# Patient Record
Sex: Male | Born: 1945 | Race: White | Hispanic: No | Marital: Married | Smoking: Former smoker
Health system: Southern US, Community
[De-identification: ages and names within clinical notes are randomized; demographics above are authoritative.]

## PROBLEM LIST (undated history)

## (undated) ENCOUNTER — Emergency Department (HOSPITAL_COMMUNITY): Payer: Medicare Other

## (undated) DIAGNOSIS — I509 Heart failure, unspecified: Secondary | ICD-10-CM

## (undated) DIAGNOSIS — J189 Pneumonia, unspecified organism: Secondary | ICD-10-CM

## (undated) DIAGNOSIS — I951 Orthostatic hypotension: Secondary | ICD-10-CM

## (undated) DIAGNOSIS — I10 Essential (primary) hypertension: Secondary | ICD-10-CM

## (undated) DIAGNOSIS — J449 Chronic obstructive pulmonary disease, unspecified: Secondary | ICD-10-CM

## (undated) DIAGNOSIS — D689 Coagulation defect, unspecified: Secondary | ICD-10-CM

## (undated) DIAGNOSIS — R42 Dizziness and giddiness: Secondary | ICD-10-CM

## (undated) DIAGNOSIS — M549 Dorsalgia, unspecified: Secondary | ICD-10-CM

## (undated) DIAGNOSIS — R0902 Hypoxemia: Secondary | ICD-10-CM

## (undated) DIAGNOSIS — K219 Gastro-esophageal reflux disease without esophagitis: Secondary | ICD-10-CM

## (undated) DIAGNOSIS — I739 Peripheral vascular disease, unspecified: Secondary | ICD-10-CM

## (undated) DIAGNOSIS — B351 Tinea unguium: Secondary | ICD-10-CM

## (undated) DIAGNOSIS — M810 Age-related osteoporosis without current pathological fracture: Secondary | ICD-10-CM

## (undated) DIAGNOSIS — J310 Chronic rhinitis: Secondary | ICD-10-CM

## (undated) DIAGNOSIS — T7840XA Allergy, unspecified, initial encounter: Secondary | ICD-10-CM

## (undated) DIAGNOSIS — G8929 Other chronic pain: Secondary | ICD-10-CM

## (undated) DIAGNOSIS — R0600 Dyspnea, unspecified: Secondary | ICD-10-CM

## (undated) DIAGNOSIS — I2699 Other pulmonary embolism without acute cor pulmonale: Secondary | ICD-10-CM

## (undated) DIAGNOSIS — C4490 Unspecified malignant neoplasm of skin, unspecified: Secondary | ICD-10-CM

## (undated) DIAGNOSIS — J439 Emphysema, unspecified: Secondary | ICD-10-CM

## (undated) DIAGNOSIS — R9389 Abnormal findings on diagnostic imaging of other specified body structures: Secondary | ICD-10-CM

## (undated) DIAGNOSIS — Z5189 Encounter for other specified aftercare: Secondary | ICD-10-CM

## (undated) DIAGNOSIS — M4850XA Collapsed vertebra, not elsewhere classified, site unspecified, initial encounter for fracture: Secondary | ICD-10-CM

## (undated) DIAGNOSIS — Z9981 Dependence on supplemental oxygen: Secondary | ICD-10-CM

## (undated) HISTORY — DX: Hypoxemia: R09.02

## (undated) HISTORY — DX: Gastro-esophageal reflux disease without esophagitis: K21.9

## (undated) HISTORY — DX: Abnormal findings on diagnostic imaging of other specified body structures: R93.89

## (undated) HISTORY — DX: Age-related osteoporosis without current pathological fracture: M81.0

## (undated) HISTORY — DX: Allergy, unspecified, initial encounter: T78.40XA

## (undated) HISTORY — PX: FRACTURE SURGERY: SHX138

## (undated) HISTORY — PX: EYE SURGERY: SHX253

## (undated) HISTORY — PX: CHOLECYSTECTOMY: SHX55

## (undated) HISTORY — DX: Encounter for other specified aftercare: Z51.89

## (undated) HISTORY — PX: CATARACT EXTRACTION, BILATERAL: SHX1313

## (undated) HISTORY — DX: Essential (primary) hypertension: I10

## (undated) HISTORY — DX: Tinea unguium: B35.1

## (undated) HISTORY — DX: Coagulation defect, unspecified: D68.9

## (undated) HISTORY — DX: Heart failure, unspecified: I50.9

## (undated) HISTORY — DX: Chronic obstructive pulmonary disease, unspecified: J44.9

## (undated) HISTORY — DX: Chronic rhinitis: J31.0

## (undated) HISTORY — PX: APPENDECTOMY: SHX54

## (undated) HISTORY — PX: TONSILLECTOMY: SUR1361

## (undated) HISTORY — PX: SKIN CANCER EXCISION: SHX779

---

## 1997-11-09 ENCOUNTER — Other Ambulatory Visit: Admission: RE | Admit: 1997-11-09 | Discharge: 1997-11-09 | Payer: Self-pay | Admitting: Dermatology

## 2002-09-15 ENCOUNTER — Encounter: Payer: Self-pay | Admitting: Internal Medicine

## 2002-09-30 ENCOUNTER — Encounter: Payer: Self-pay | Admitting: Internal Medicine

## 2002-10-11 ENCOUNTER — Encounter: Payer: Self-pay | Admitting: Internal Medicine

## 2005-12-27 ENCOUNTER — Ambulatory Visit: Payer: Self-pay | Admitting: Internal Medicine

## 2005-12-31 ENCOUNTER — Encounter: Payer: Self-pay | Admitting: Internal Medicine

## 2005-12-31 ENCOUNTER — Encounter: Admission: RE | Admit: 2005-12-31 | Discharge: 2005-12-31 | Payer: Self-pay | Admitting: Internal Medicine

## 2005-12-31 IMAGING — US US SCROTUM
1 series · 14 of 25 positions shown · non-contrast
Comparison: none

CLINICAL DATA: Right scrotal mass for 1 month. 
 SCROTAL ULTRASOUND:
TECHNIQUE: Complete ultrasound examination of the testicles, epididymis, and other scrotal structures was performed.

[Series 1: us scrotum · 0.08mm/px · 58 acquisitions, 14 frames shown]
[im 1/58]
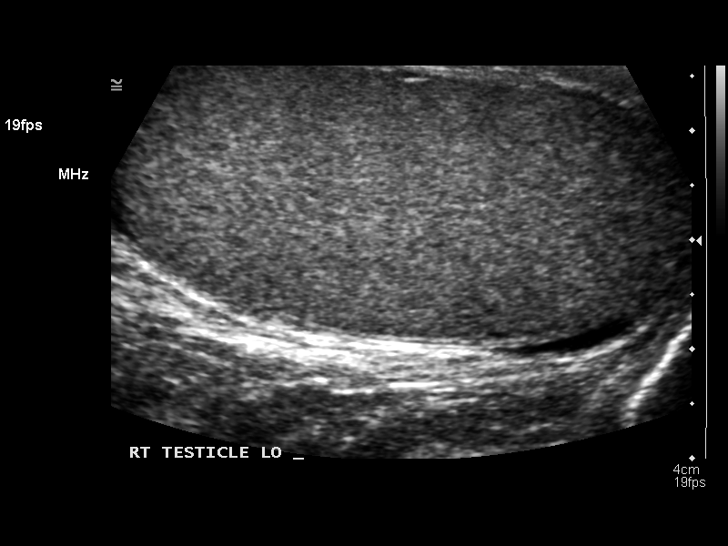
[im 5/58]
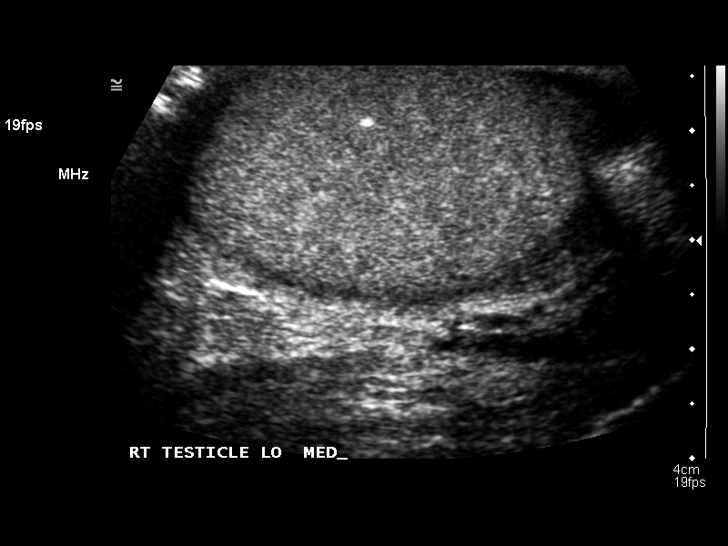
[im 10/58]
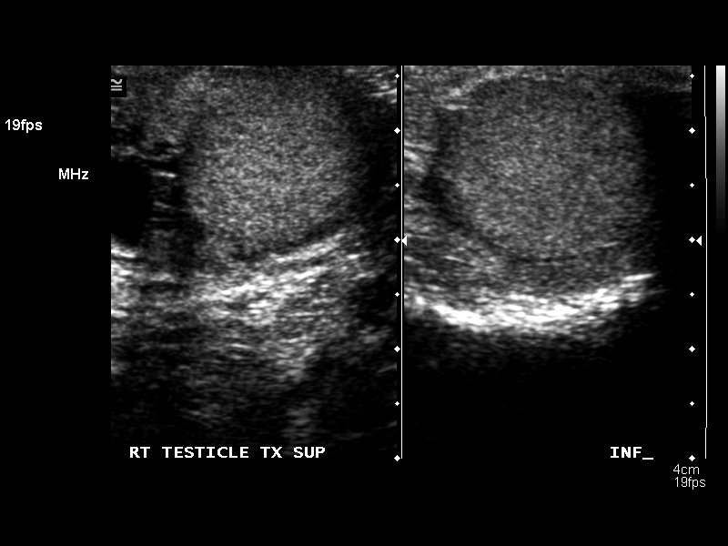
[im 15/58]
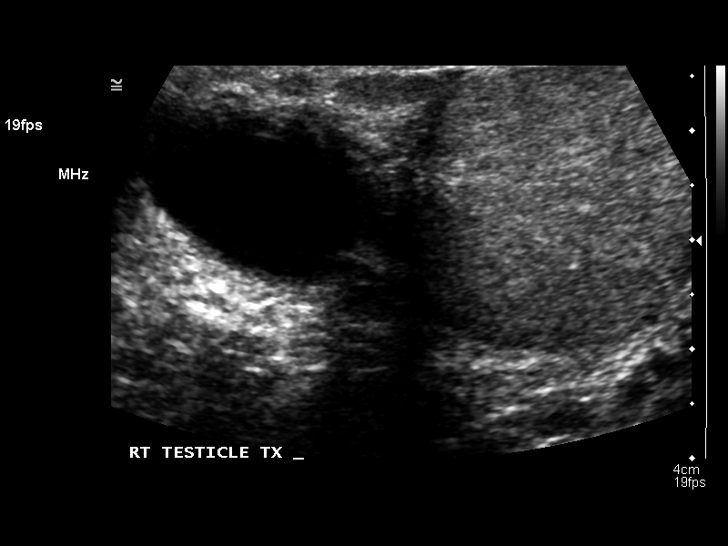
[im 20/58]
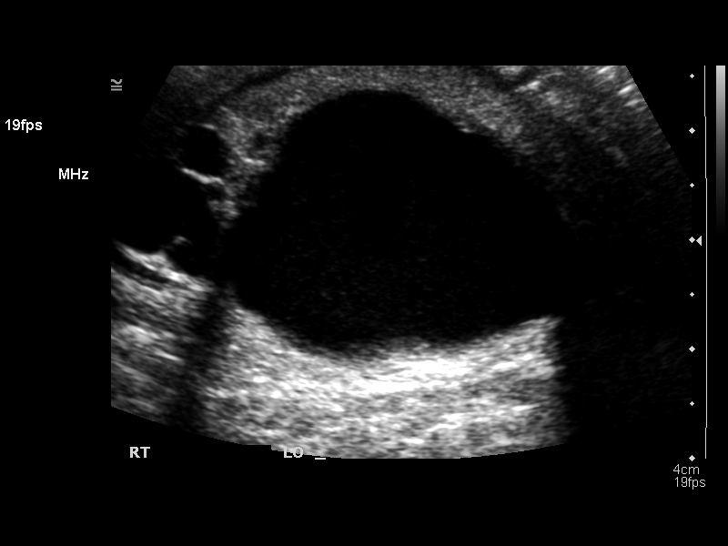
[im 22/58]
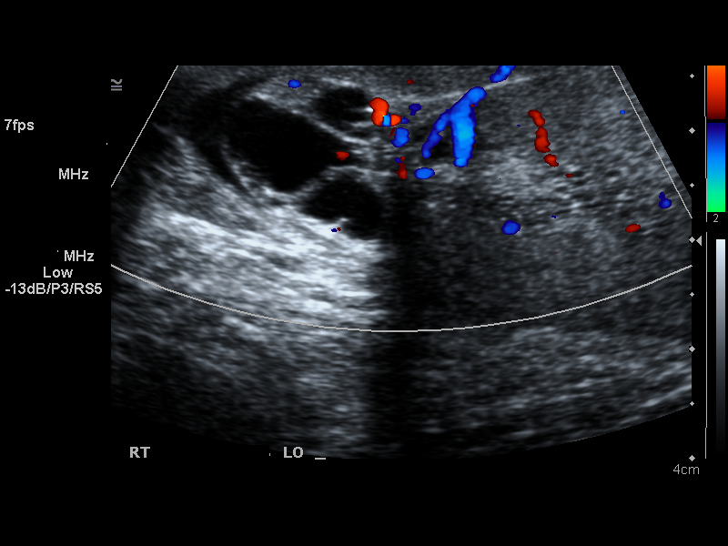
[im 27/58]
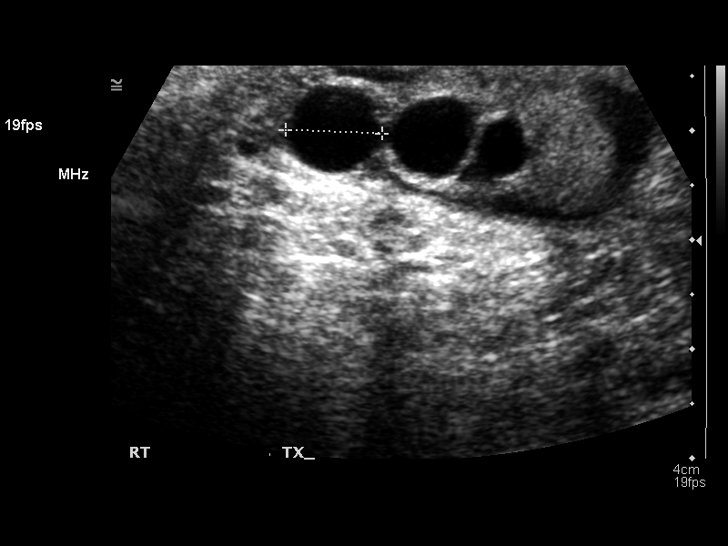
[im 31/58]
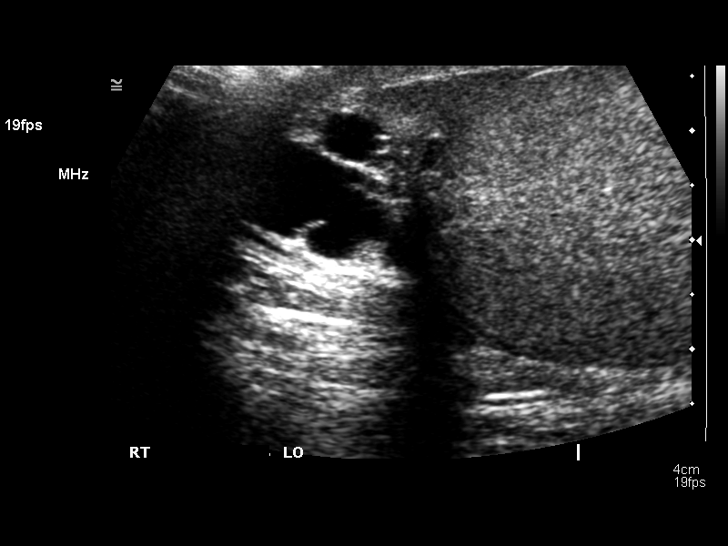
[im 36/58]
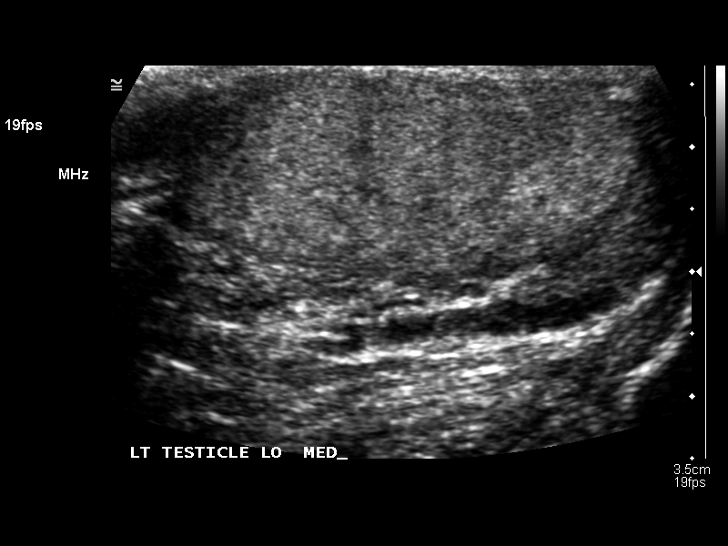
[im 39/58]
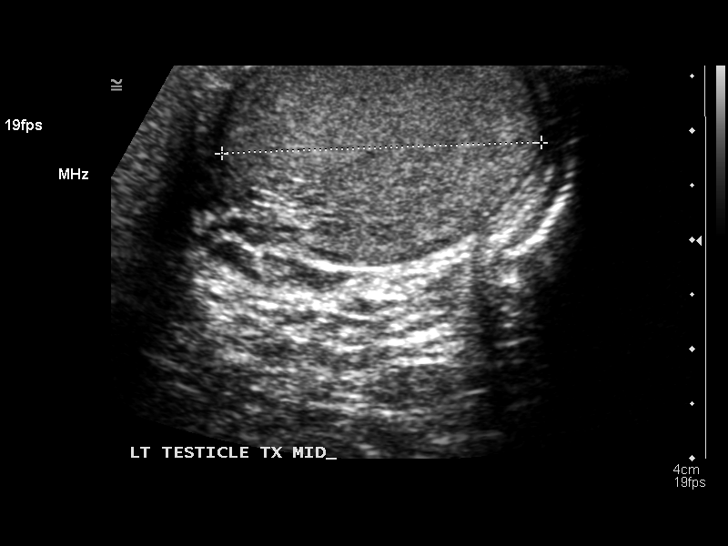
[im 43/58]
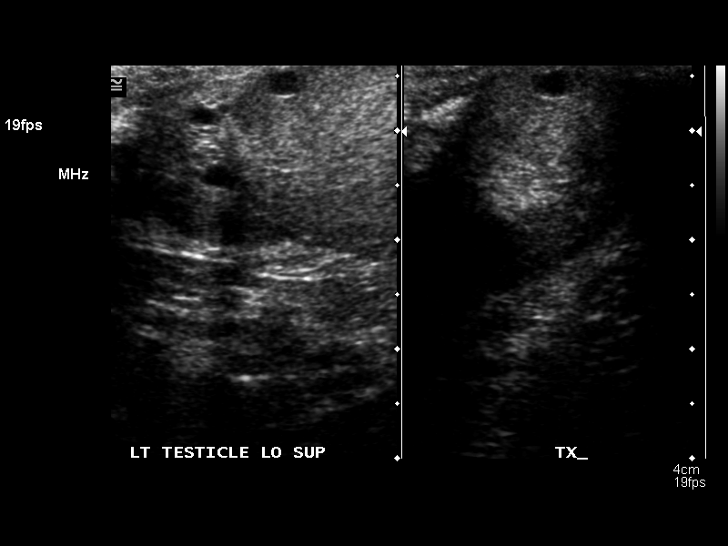
[im 48/58]
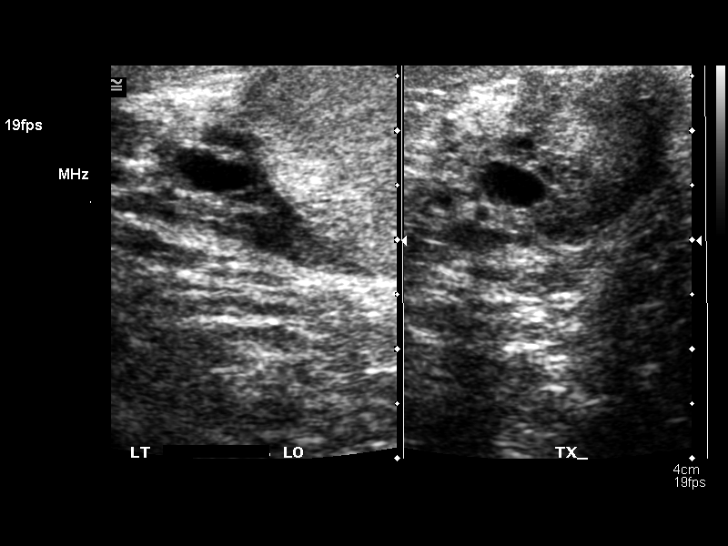
[im 53/58]
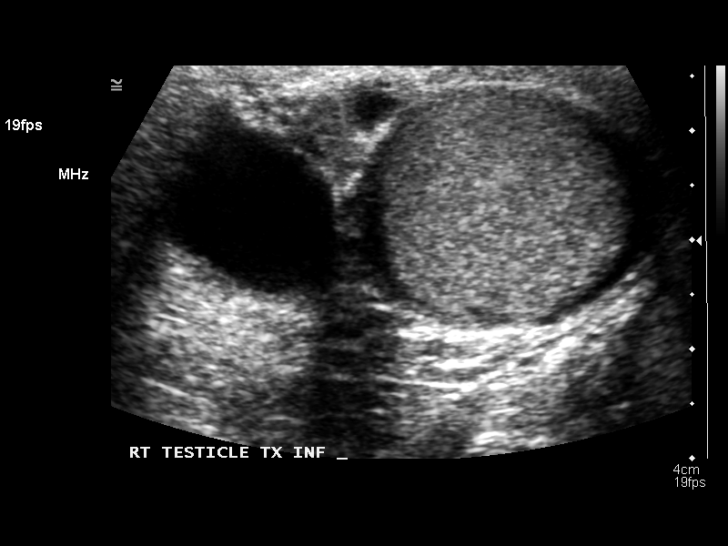
[im 58/58]
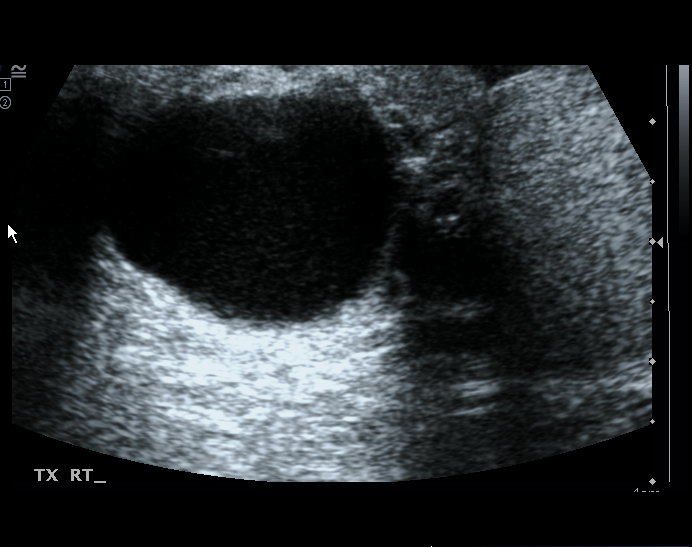

[14 of 25 positions shown; findings below may reference images not displayed]

FINDINGS: The testes are fairly symmetric in size, measuring 5.3 x 2.6 x 3.0 cm on the right and 4.9 x 2.1 x 2.9 cm on the left.  No suspicious testicular findings are present.  Superiorly in the left testis is a probable tunica albuginea cyst measuring 4 mm in greatest dimension.
 Corresponding with the patient?s palpable concern in the right scrotum is an extratesticular cyst measuring 3.9 x 2.0 x 2.9 cm.  This somewhat deforms the testis and is probably arising from the right epididymis.  There are several additional smaller epididymal cysts bilaterally.  No hydrocele or varicocele is seen.
IMPRESSION: 1.  There are multiple extratesticular cysts bilaterally, the largest on the right arising from the epididymis and measuring up to 3.9 cm in diameter.  
 2.  There are no suspicious testicular findings.  A probable incidental tunica cyst is demonstrated on the left.

## 2007-02-25 ENCOUNTER — Telehealth (INDEPENDENT_AMBULATORY_CARE_PROVIDER_SITE_OTHER): Payer: Self-pay | Admitting: *Deleted

## 2007-04-24 ENCOUNTER — Ambulatory Visit: Payer: Self-pay | Admitting: Internal Medicine

## 2007-04-28 ENCOUNTER — Telehealth: Payer: Self-pay | Admitting: Internal Medicine

## 2008-08-18 ENCOUNTER — Telehealth: Payer: Self-pay | Admitting: Internal Medicine

## 2008-08-23 ENCOUNTER — Ambulatory Visit: Payer: Self-pay | Admitting: Internal Medicine

## 2008-08-23 DIAGNOSIS — J439 Emphysema, unspecified: Secondary | ICD-10-CM

## 2008-08-23 DIAGNOSIS — Z86010 Personal history of colonic polyps: Secondary | ICD-10-CM | POA: Insufficient documentation

## 2008-08-23 DIAGNOSIS — Z87891 Personal history of nicotine dependence: Secondary | ICD-10-CM | POA: Insufficient documentation

## 2008-10-31 ENCOUNTER — Ambulatory Visit: Payer: Self-pay | Admitting: Internal Medicine

## 2008-10-31 DIAGNOSIS — R930 Abnormal findings on diagnostic imaging of skull and head, not elsewhere classified: Secondary | ICD-10-CM | POA: Insufficient documentation

## 2008-10-31 LAB — CONVERTED CEMR LAB
BUN: 9 mg/dL (ref 6–23)
Basophils Absolute: 0 10*3/uL (ref 0.0–0.1)
Cholesterol: 192 mg/dL (ref 0–200)
Creatinine, Ser: 1 mg/dL (ref 0.4–1.5)
Eosinophils Relative: 1.7 % (ref 0.0–5.0)
GFR calc non Af Amer: 80.71 mL/min (ref >60)
LDL Cholesterol: 118 mg/dL — ABNORMAL HIGH (ref 0–99)
MCV: 98.2 fL (ref 78.0–100.0)
Monocytes Absolute: 0.5 10*3/uL (ref 0.1–1.0)
Monocytes Relative: 5.5 % (ref 3.0–12.0)
Neutrophils Relative %: 77.8 % — ABNORMAL HIGH (ref 43.0–77.0)
Platelets: 299 10*3/uL (ref 150.0–400.0)
TSH: 1.33 microintl units/mL (ref 0.35–5.50)
Triglycerides: 63 mg/dL (ref 0.0–149.0)
VLDL: 12.6 mg/dL (ref 0.0–40.0)
WBC: 8.5 10*3/uL (ref 4.5–10.5)

## 2008-10-31 IMAGING — CR DG CHEST 2V
5 series · 5 of 5 positions shown · non-contrast
Comparison: [DATE]

CLINICAL DATA: Cough and worsening shortness of breath.  Smoker.

CHEST - 2 VIEW

[view not recorded (1 of 5)]
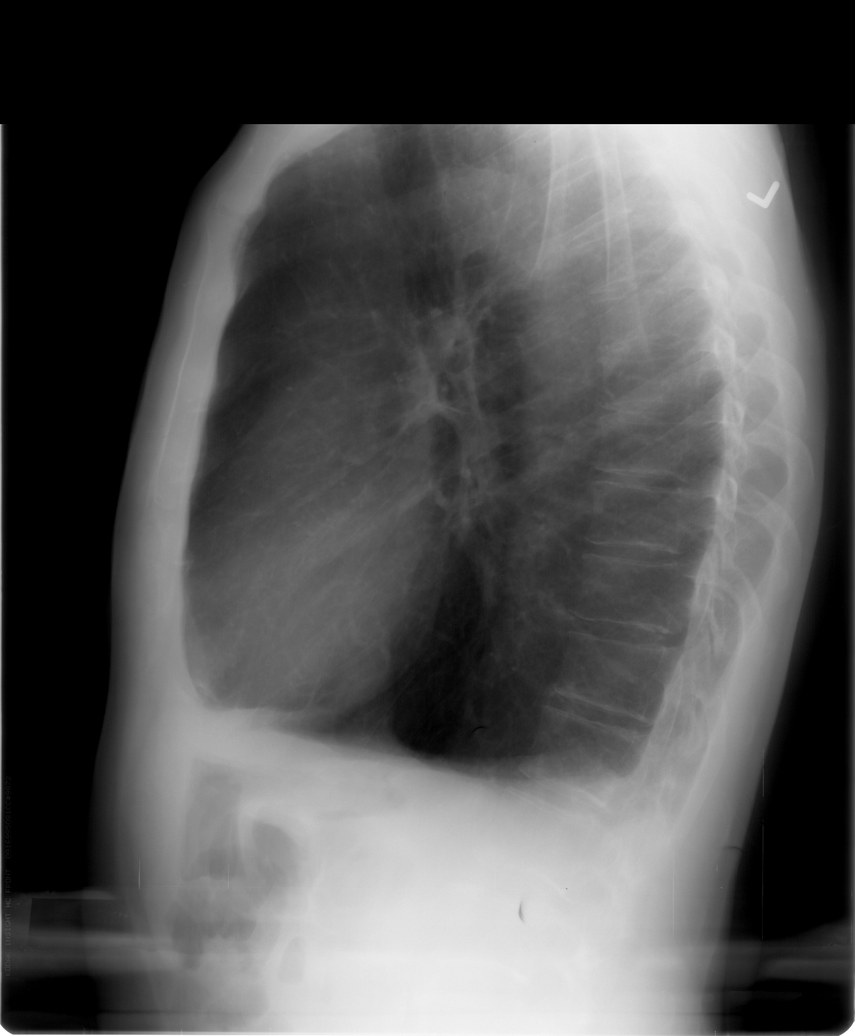

[view not recorded (2 of 5)]
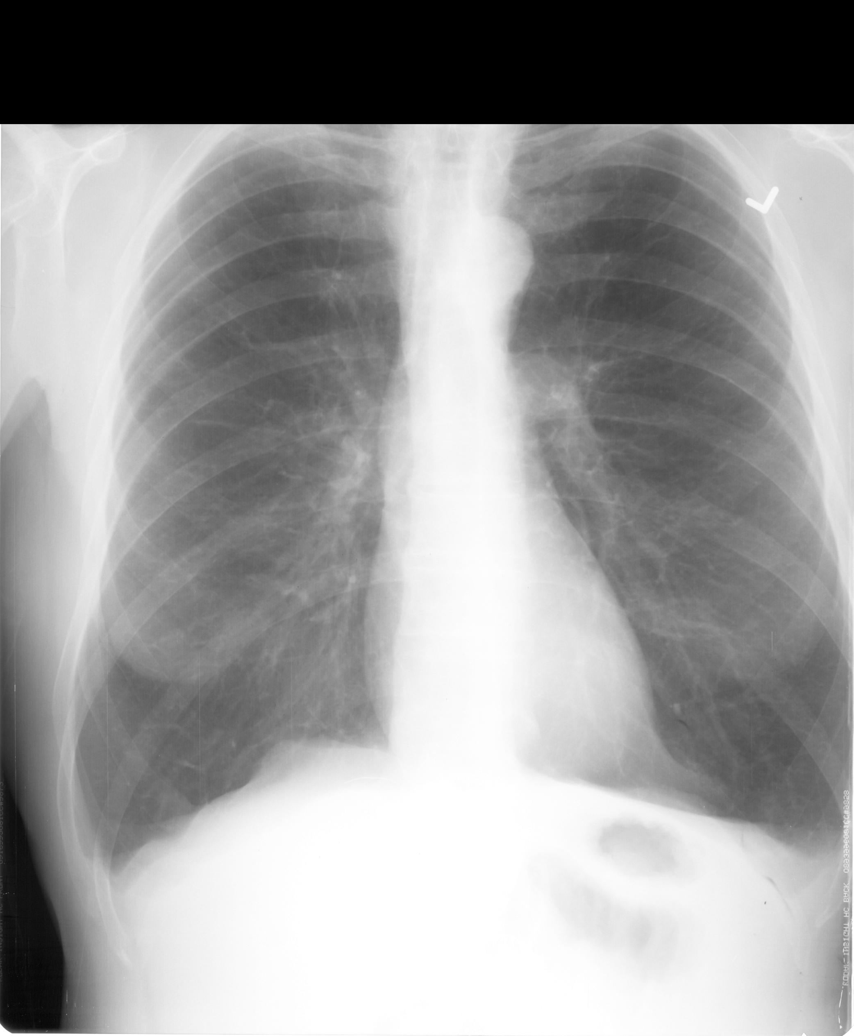

[view not recorded (3 of 5)]
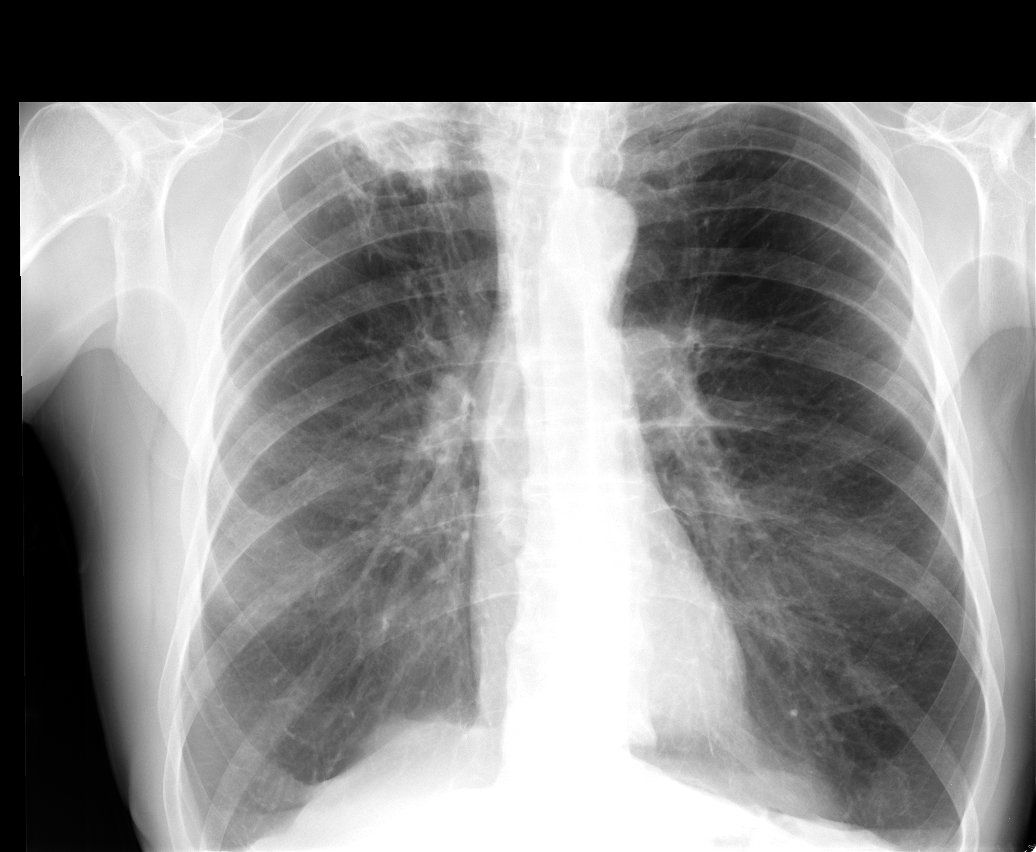

[view not recorded (4 of 5)]
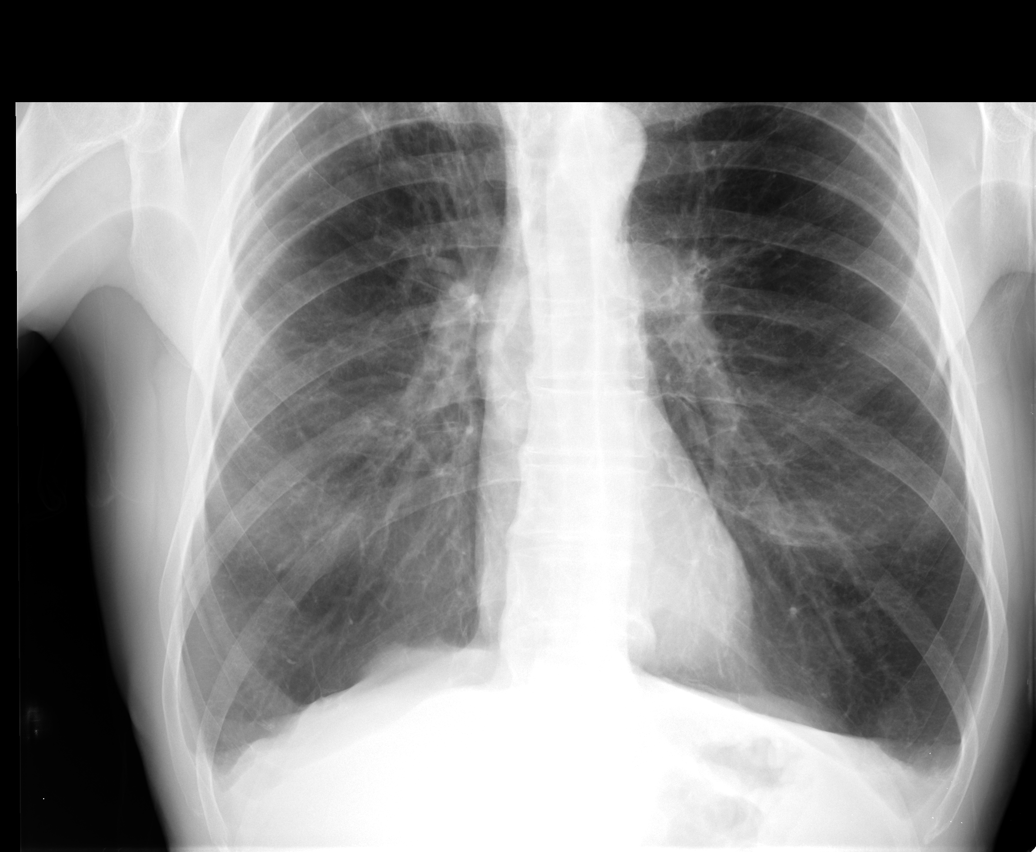

[view not recorded (5 of 5)]
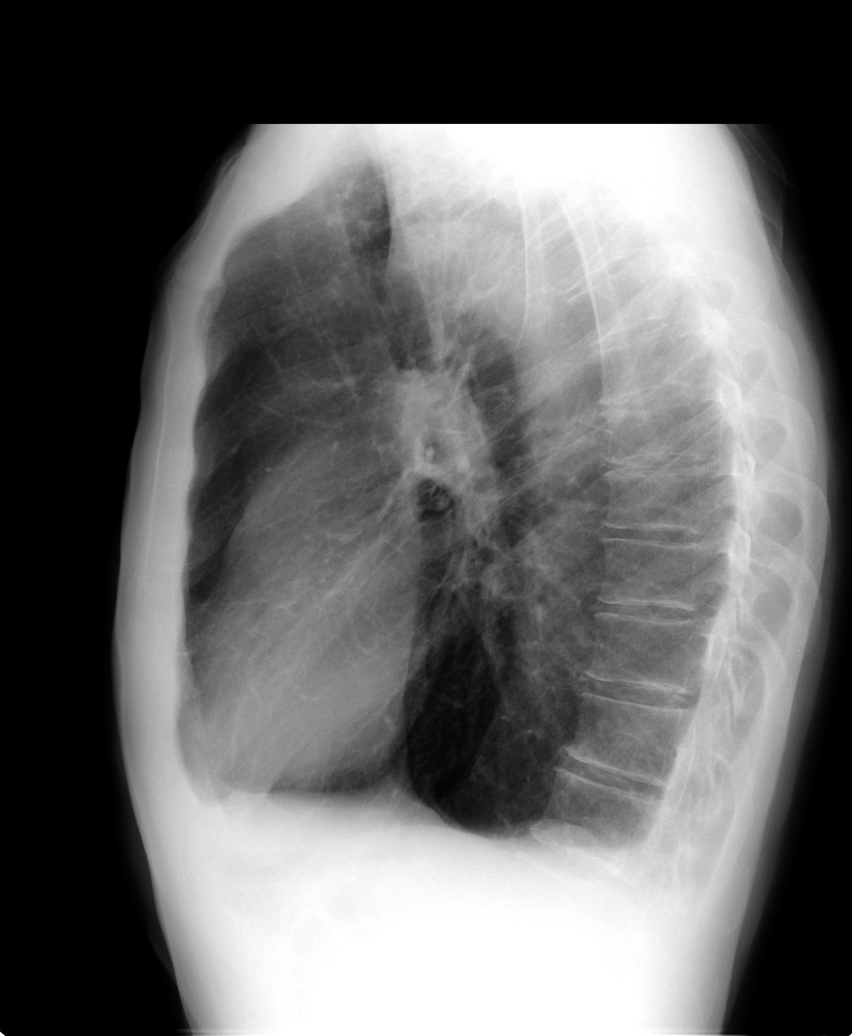

[5 of 5 positions shown; findings below may reference images not displayed]

FINDINGS: Trachea is midline.  Heart size normal.  Airspace
consolidation is seen in the right lung apex.  There may be
associated cavitation.  Slight right hilar retraction is also
noted.  Lungs appear emphysematous.  Added density over the lower
right mid lung zone is likely due to overlying soft tissue.  No
pleural fluid.
IMPRESSION: Right apical mass-like air space consolidation may be due to
infection or malignancy.  Tuberculosis is not excluded.  This was
discussed with HIRPA, in Dr. [REDACTED] at [DATE] on
[DATE].

## 2008-11-02 ENCOUNTER — Ambulatory Visit: Payer: Self-pay | Admitting: Cardiology

## 2008-11-02 ENCOUNTER — Ambulatory Visit: Payer: Self-pay | Admitting: Internal Medicine

## 2008-11-02 IMAGING — CT CT CHEST W/ CM
2 of 4 series · 15 of 36 positions shown, 18 images · IV contrast (agent unspecified)
Comparison: No prior chest CT. [HOSPITAL] chest x-ray exam
dated [DATE].

CLINICAL DATA: Right apical mass seen on recent chest x-ray.
Shortness of breath with exertion.  Cough.  Weight loss.  Smoking
history.

CT CHEST WITH CONTRAST
TECHNIQUE: Multidetector CT imaging of the chest was performed
following the standard protocol during bolus administration of
intravenous contrast.
Contrast: 80 ml [Y5]

[Series 2: chest routine with · axial · 0.77mm/px · z∈[-383,-53]mm · 12 of 78 slices shown, 15 images]
[im 6/78  mediastinal]
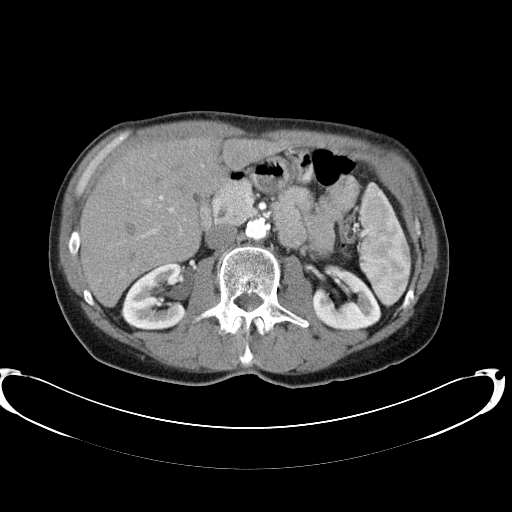
[im 6/78  lung]
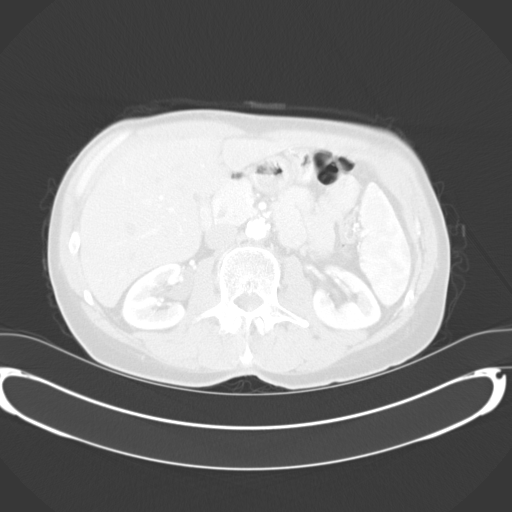
[im 12/78  lung]
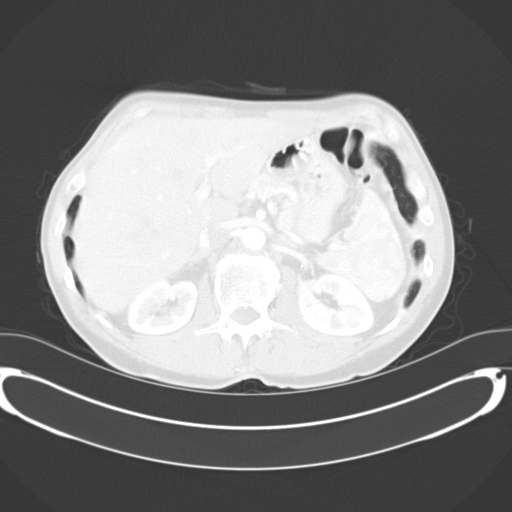
[im 18/78  lung]
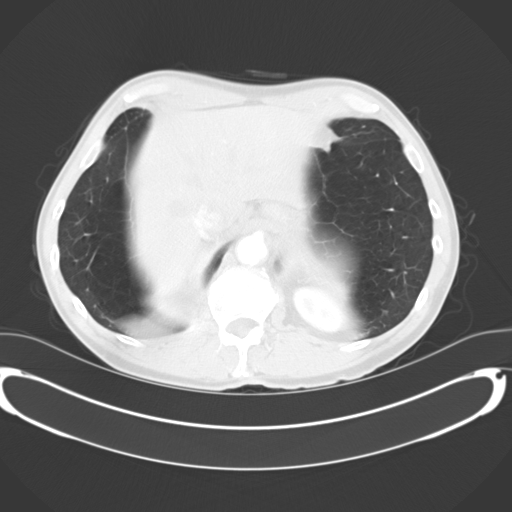
[im 24/78  lung]
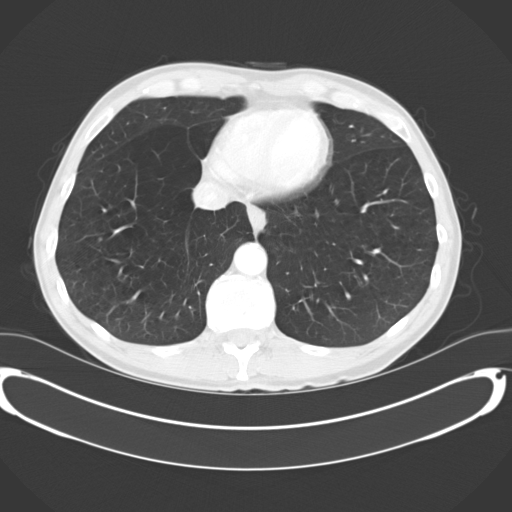
[im 30/78  mediastinal]
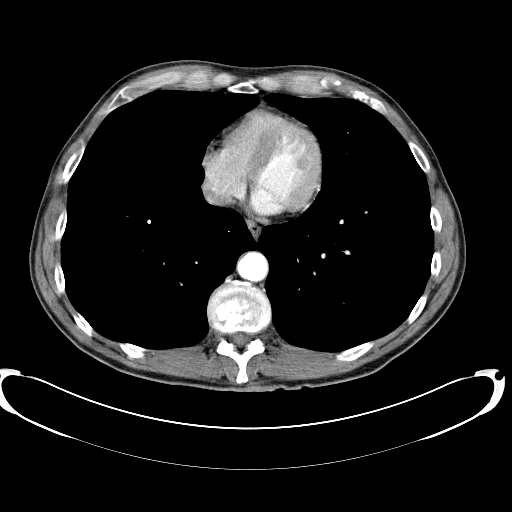
[im 30/78  lung]
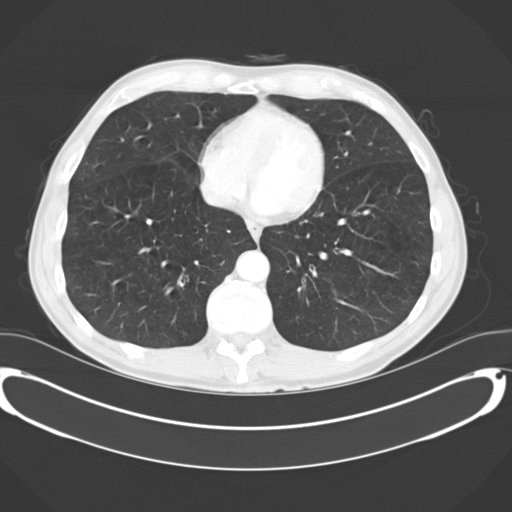
[im 36/78  lung]
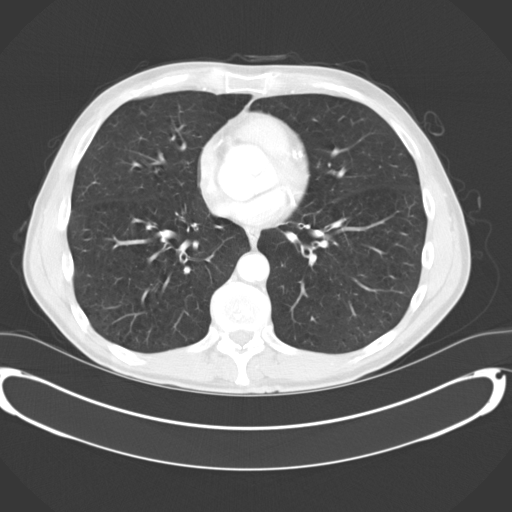
[im 42/78  lung]
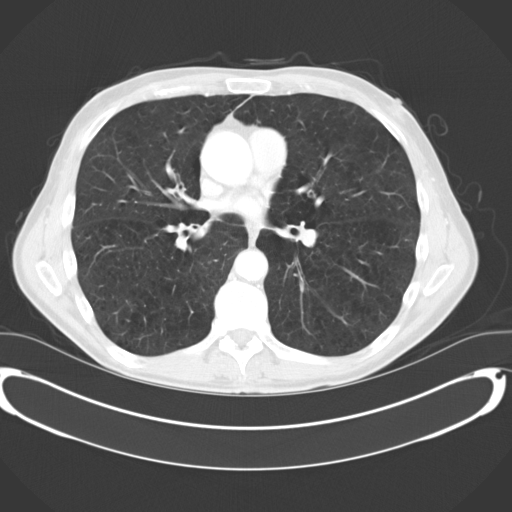
[im 48/78  lung]
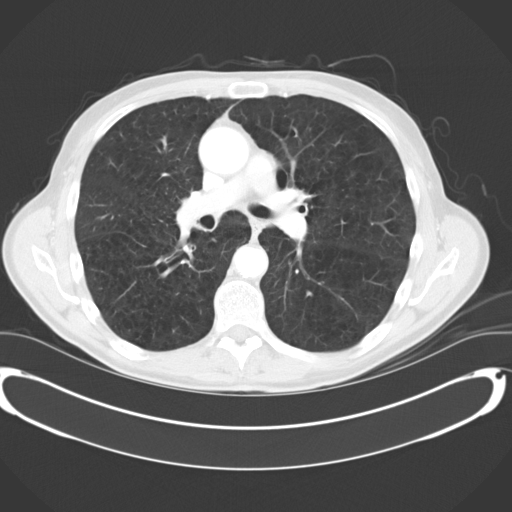
[im 54/78  mediastinal]
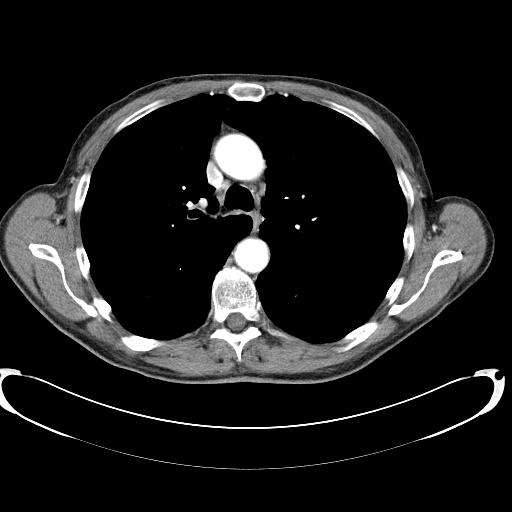
[im 54/78  lung]
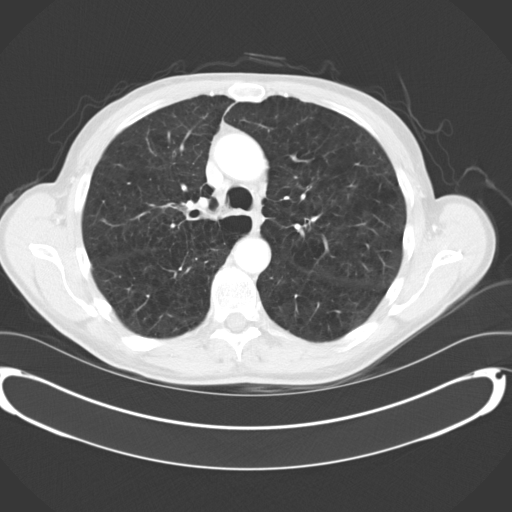
[im 60/78  lung]
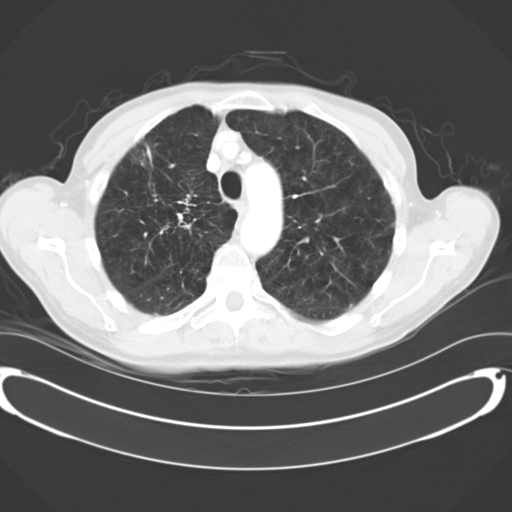
[im 66/78  lung]
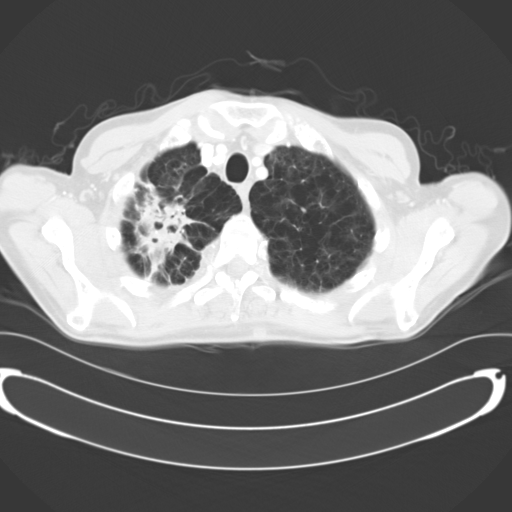
[im 72/78  lung]
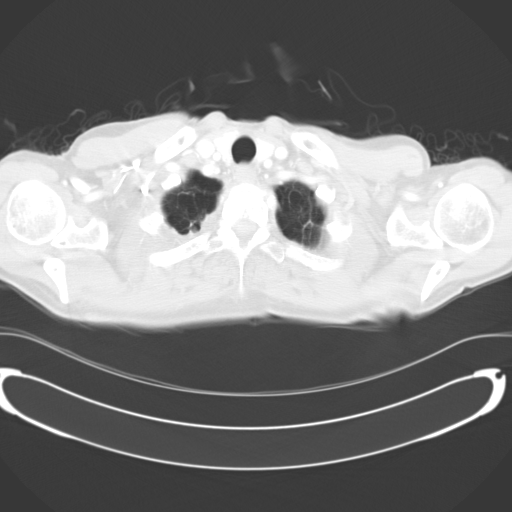

[Series 602: <mpr range> · coronal · 0.77mm/px · 3 of 123 slices shown]
[im 25/123  lung]
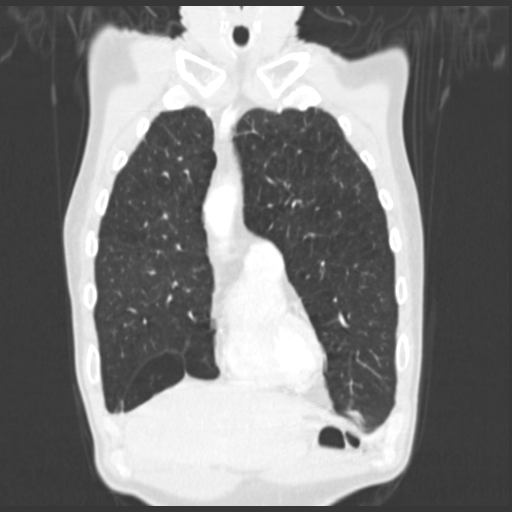
[im 49/123  lung]
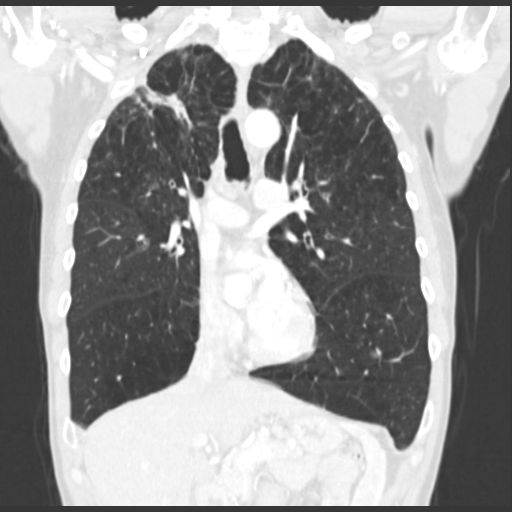
[im 74/123  lung]
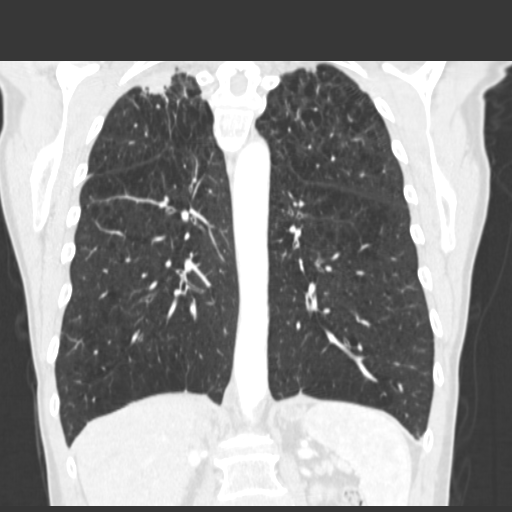

[15 of 36 positions shown; findings below may reference images not displayed]

FINDINGS: There is no axillary lymphadenopathy.  Scattered small
lymph nodes are seen in the mediastinum, but there is no
mediastinal or hilar lymphadenopathy.  Heart size is normal.
Ascending aorta measures up to 3.8 cm in diameter, borderline
aneurysmal.  Coronary artery calcification is evident.  There is no
pericardial or pleural effusion.

Advanced emphysema is seen diffusely in both lungs.  The
abnormality seen on the x-ray corresponds to a large area of
parenchymal consolidation with some air bronchograms and cystic
change.  This measures about 5.7 x 6.5 cm.  Slight anterior
displacement major fissure suggests that there is some volume loss
associated with this process as well.

Otherwise the lungs show no evidence for parenchymal nodule or
mass.  No other areas of airspace consolidation are evident.

Bone windows revealed no worrisome lytic or sclerotic osseous
lesion.  Imaging which includes the upper abdomen shows no evidence
for an adnexal mass.
IMPRESSION: Ill-defined 6 cm opacity in the right apex superimposed on a
background of advanced emphysema.  There is a degree of volume loss
associated with this process.  Imaging considerations include post
infectious/postinflammatory scarring, infection, and neoplasm.

No evidence for right hilar or mediastinal lymphadenopathy.

## 2008-11-03 ENCOUNTER — Ambulatory Visit: Payer: Self-pay | Admitting: Internal Medicine

## 2008-11-07 ENCOUNTER — Telehealth (INDEPENDENT_AMBULATORY_CARE_PROVIDER_SITE_OTHER): Payer: Self-pay | Admitting: *Deleted

## 2008-11-08 ENCOUNTER — Ambulatory Visit: Payer: Self-pay

## 2008-11-08 ENCOUNTER — Encounter: Payer: Self-pay | Admitting: Internal Medicine

## 2008-11-15 ENCOUNTER — Ambulatory Visit: Payer: Self-pay | Admitting: Pulmonary Disease

## 2008-12-12 ENCOUNTER — Telehealth: Payer: Self-pay | Admitting: Internal Medicine

## 2008-12-13 ENCOUNTER — Ambulatory Visit: Payer: Self-pay | Admitting: Internal Medicine

## 2008-12-13 IMAGING — CR DG CHEST 2V
3 series · 3 of 3 positions shown · non-contrast
Comparison: CT of [DATE] and plain film of [DATE]

CLINICAL DATA: Cough and fever for 4 days.  Posterior left chest
pain.  Smoker.  Emphysema.

CHEST - 2 VIEW

[view not recorded (1 of 3)]
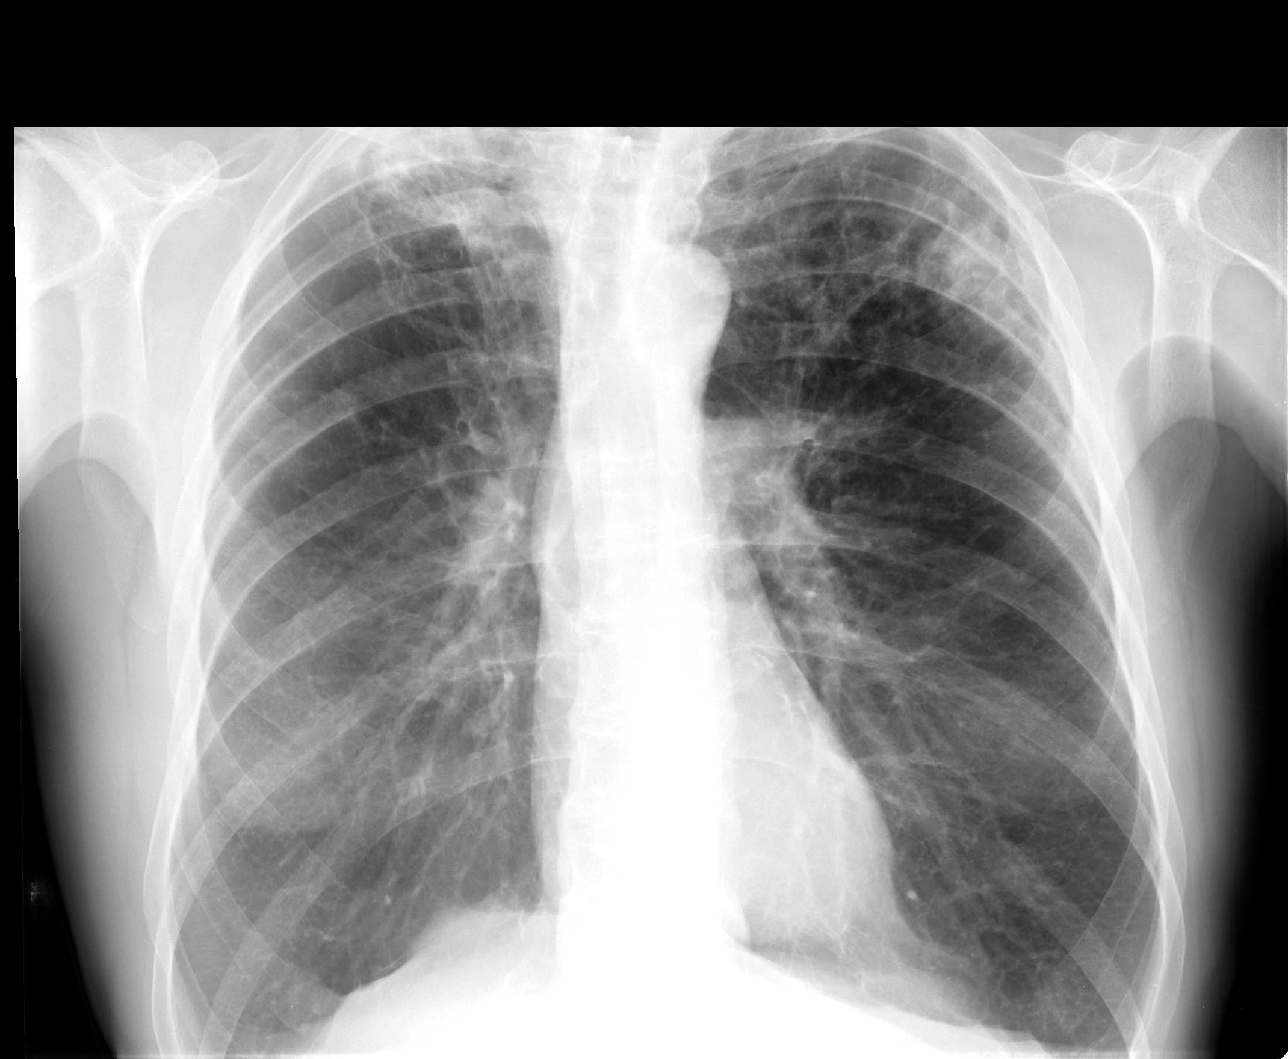

[view not recorded (2 of 3)]
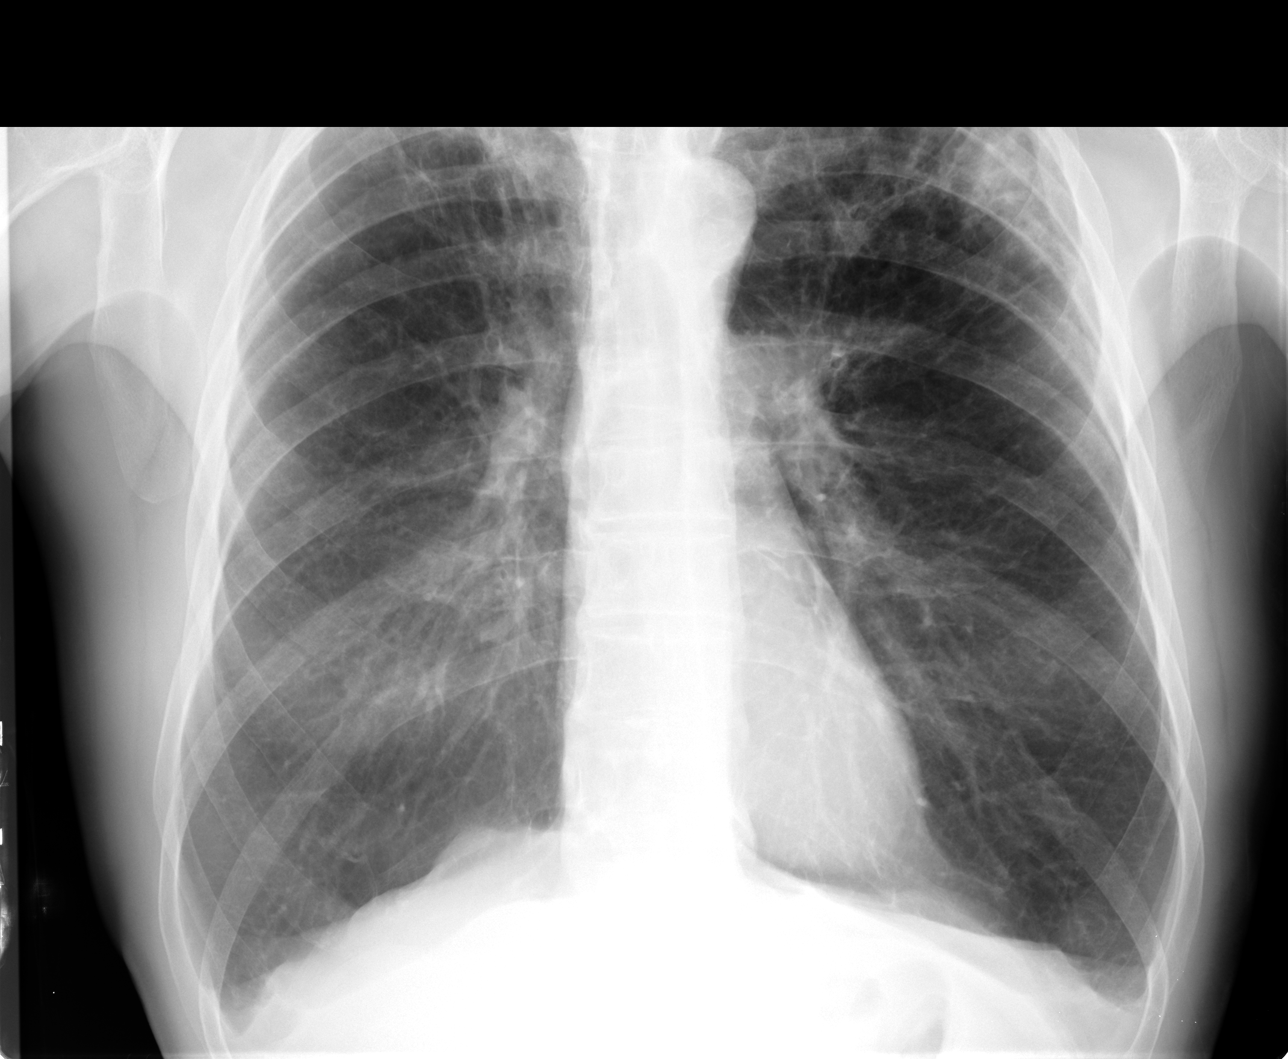

[view not recorded (3 of 3)]
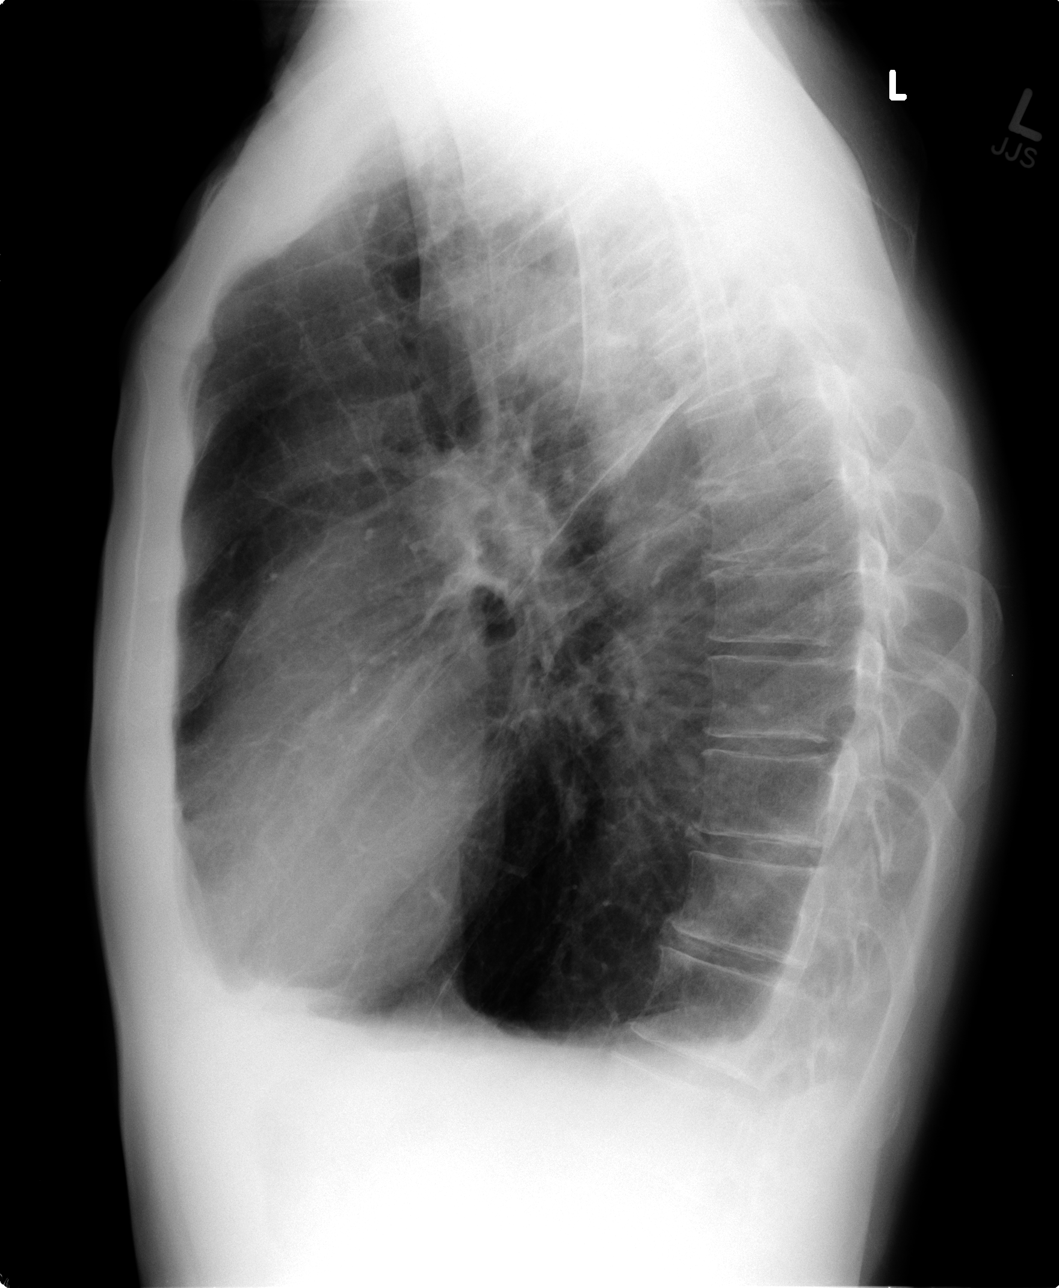

[3 of 3 positions shown; findings below may reference images not displayed]

FINDINGS: Underlying COPD. Midline trachea.Normal heart size and
mediastinal contours. Trace bilateral pleural effusions versus
pleural thickening . Similar right apical opacity with probable
underlying bronchiectasis.  Development of patchy left apical and
upper lobe interstitial opacity.
IMPRESSION: 1.  Development of left upper lobe opacity.  Given the appearance
of the right apex, this is suspicious for atypical infection,
including tuberculosis.  Recommend radiographic follow-up until
clearing.
2.  Similar right apical opacity as detailed on CT of [DATE].
[DATE].  Underlying COPD.
4.  Trace bilateral pleural effusions versus pleural thickening.

## 2008-12-16 ENCOUNTER — Telehealth (INDEPENDENT_AMBULATORY_CARE_PROVIDER_SITE_OTHER): Payer: Self-pay | Admitting: *Deleted

## 2008-12-16 ENCOUNTER — Ambulatory Visit: Payer: Self-pay | Admitting: Pulmonary Disease

## 2008-12-21 ENCOUNTER — Telehealth: Payer: Self-pay | Admitting: Pulmonary Disease

## 2008-12-22 ENCOUNTER — Ambulatory Visit: Admission: RE | Admit: 2008-12-22 | Discharge: 2008-12-22 | Payer: Self-pay | Admitting: Emergency Medicine

## 2008-12-22 ENCOUNTER — Encounter: Payer: Self-pay | Admitting: Emergency Medicine

## 2008-12-22 ENCOUNTER — Encounter: Payer: Self-pay | Admitting: Pulmonary Disease

## 2008-12-22 ENCOUNTER — Ambulatory Visit: Payer: Self-pay | Admitting: Emergency Medicine

## 2008-12-22 IMAGING — CR DG CHEST 1V PORT
2 series · 2 of 2 positions shown · non-contrast
Comparison: [DATE]

CLINICAL DATA: Post bronchoscopy with biopsy.

PORTABLE CHEST - 1 VIEW

[view not recorded (1 of 2)]
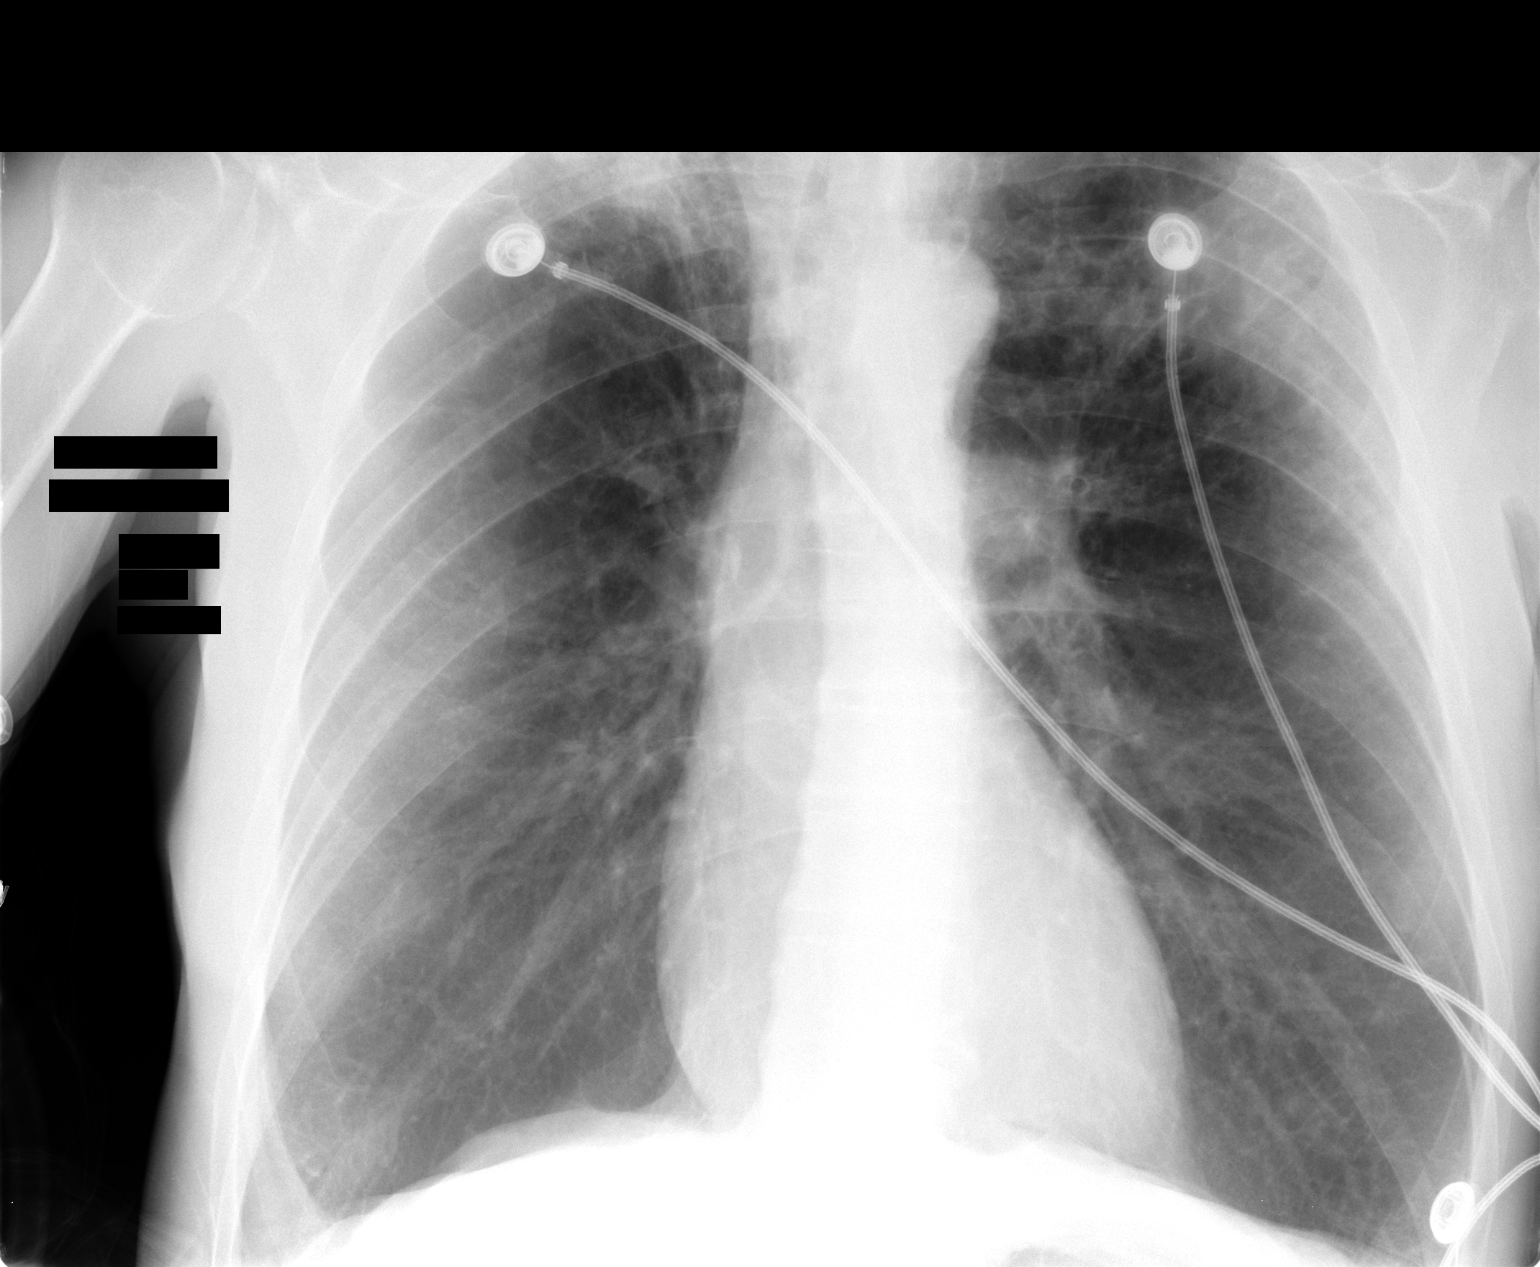

[view not recorded (2 of 2)]
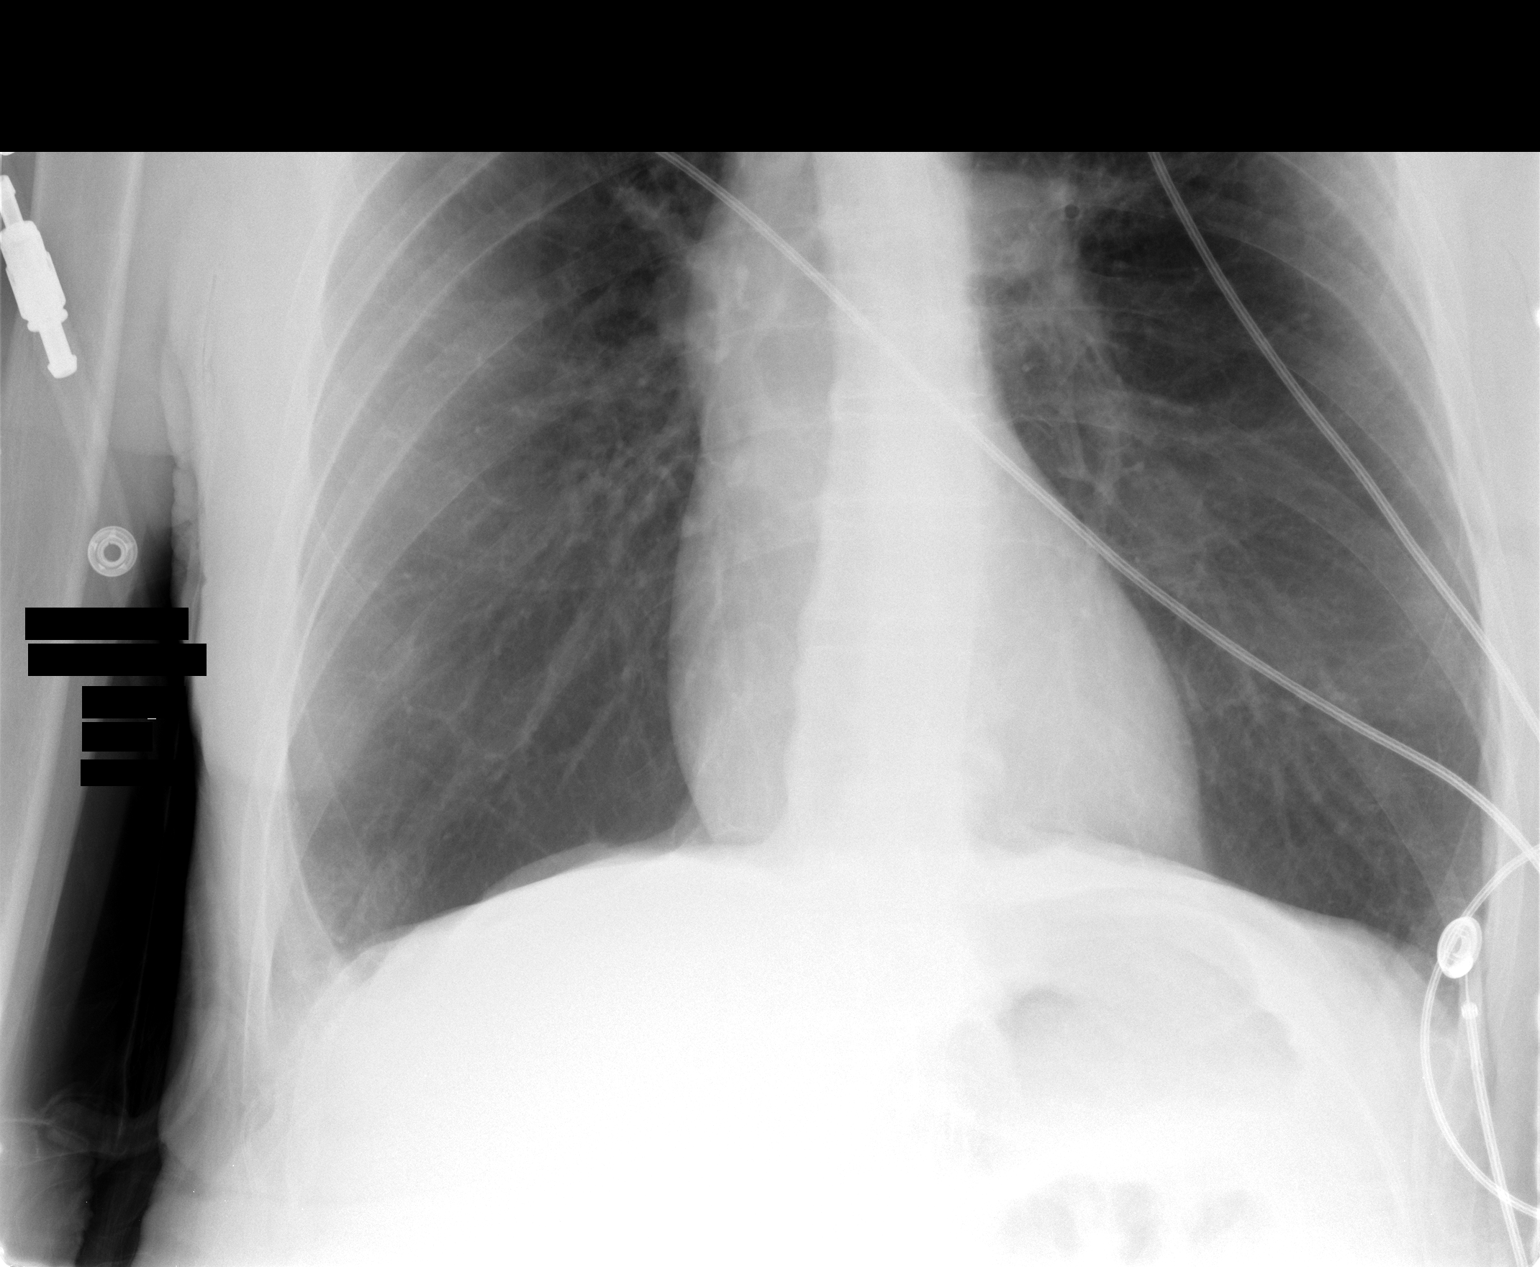

[2 of 2 positions shown; findings below may reference images not displayed]

FINDINGS: Artifact overlies chest.  Patchy pulmonary densities
remain evident in the upper lobes, more notable on the right than
on the left.  The lower portions of the lungs are clear.  No
evidence of pneumothorax.  No pleural fluid.  Vascularity is
normal.
IMPRESSION: No acute finding.  Chronically known markings in the upper lobes.
No pneumothorax.

## 2008-12-23 ENCOUNTER — Telehealth: Payer: Self-pay | Admitting: Pulmonary Disease

## 2009-02-03 ENCOUNTER — Telehealth: Payer: Self-pay | Admitting: Pulmonary Disease

## 2009-02-03 ENCOUNTER — Ambulatory Visit: Payer: Self-pay | Admitting: Pulmonary Disease

## 2009-02-07 ENCOUNTER — Encounter: Payer: Self-pay | Admitting: Pulmonary Disease

## 2009-03-03 ENCOUNTER — Ambulatory Visit: Payer: Self-pay | Admitting: Pulmonary Disease

## 2009-03-03 IMAGING — CR DG CHEST 2V
2 series · 2 of 2 positions shown · non-contrast
Comparison: Portable chest x-ray [DATE] and two-view chest x-
ray [DATE].

CLINICAL DATA: Post bronchoscopy and right upper lobe biopsy.

CHEST - 2 VIEW [DATE]:

[view not recorded (1 of 2)]
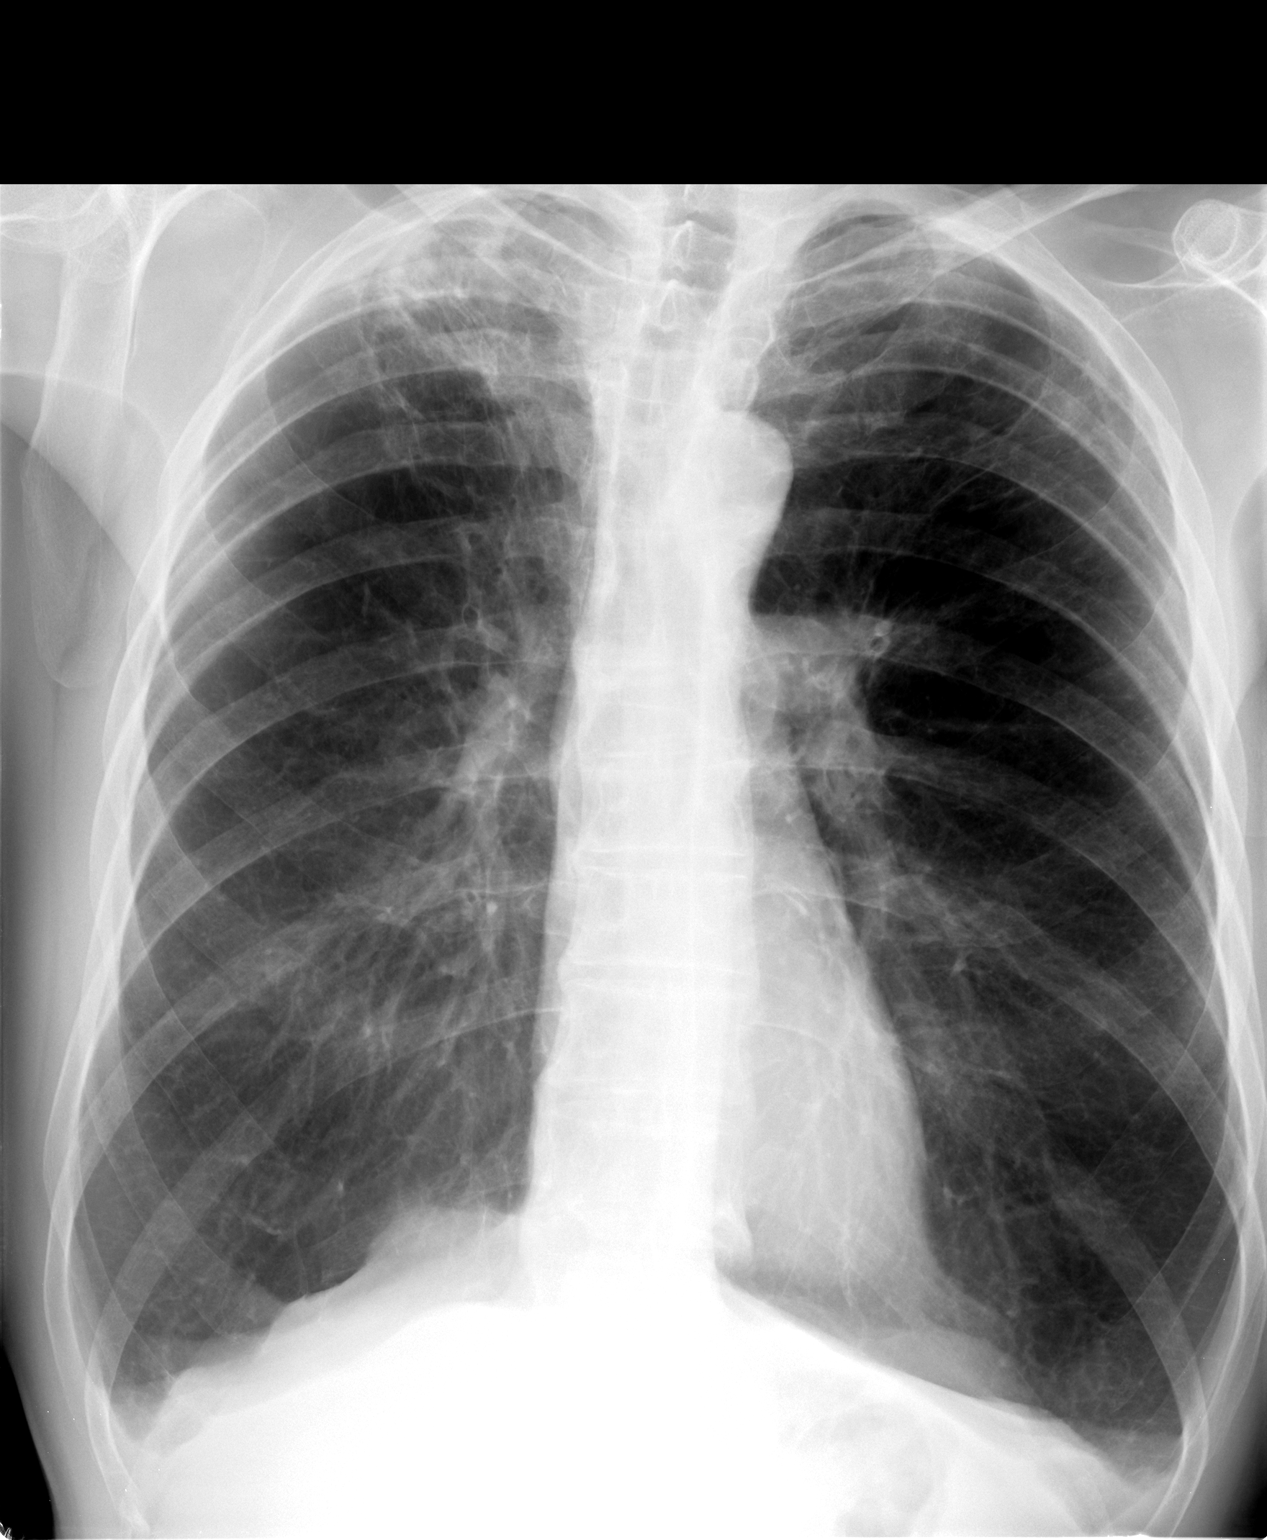

[view not recorded (2 of 2)]
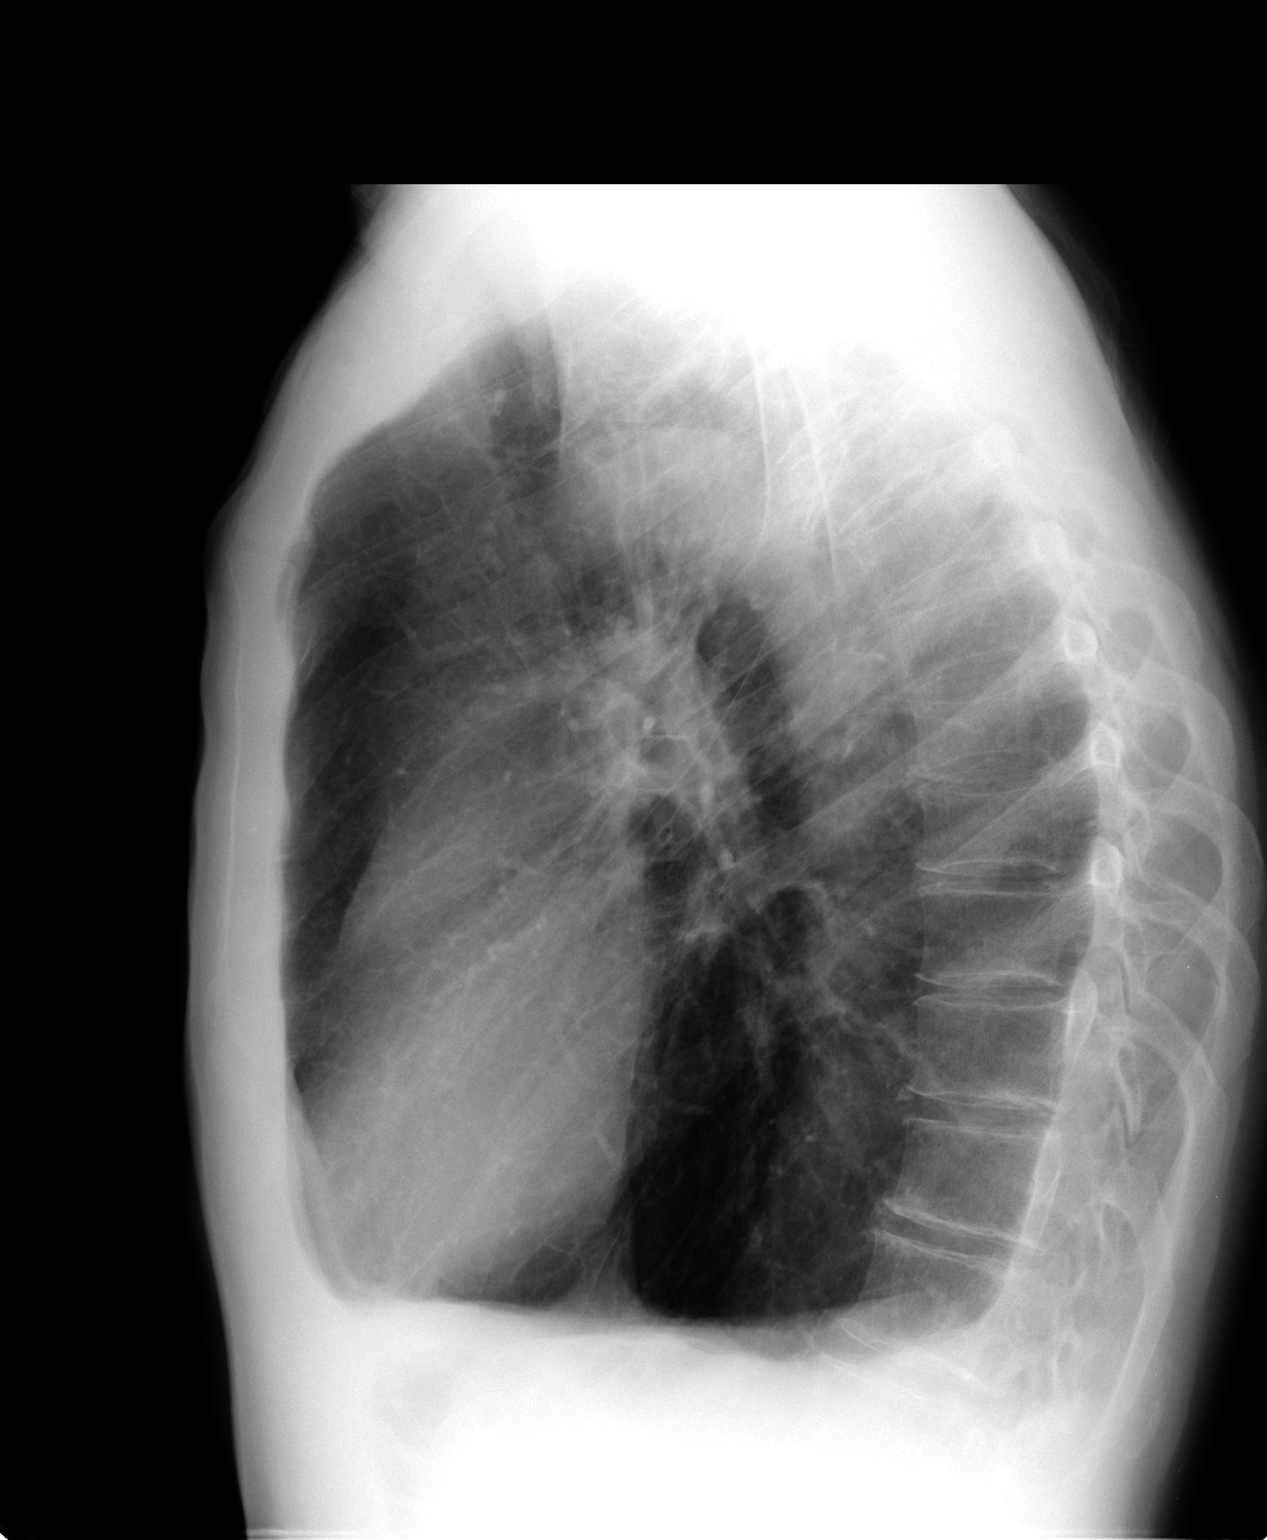

[2 of 2 positions shown; findings below may reference images not displayed]

FINDINGS: No pneumothorax or pneumomediastinum post-bronchoscopy.
Emphysematous changes throughout both lungs with marked
hyperinflation.  Pleuroparenchymal scarring in the upper lobes,
right greater than left, with less opacity in the right lung apex
since the prior portable chest x-ray.  Lungs otherwise clear.
Heart size normal and stable.  Mildly prominent left main pulmonary
artery.  No pleural effusions.  Chronic pleuroparenchymal scarring
at the bases.  Mild degenerative changes throughout the thoracic
spine.
IMPRESSION: No pneumothorax or pneumomediastinum post-bronchoscopy.
COPD/emphysema.  Biapical pleuroparenchymal scarring, right greater
than left, with less opacity in the right lung apex since the
examinations in [DATE].

## 2009-07-17 ENCOUNTER — Ambulatory Visit: Payer: Self-pay | Admitting: Internal Medicine

## 2009-07-17 ENCOUNTER — Telehealth (INDEPENDENT_AMBULATORY_CARE_PROVIDER_SITE_OTHER): Payer: Self-pay | Admitting: *Deleted

## 2009-07-17 ENCOUNTER — Telehealth: Payer: Self-pay | Admitting: Pulmonary Disease

## 2009-07-17 IMAGING — CR DG CHEST 2V
2 series · 2 of 2 positions shown · non-contrast
Comparison: [DATE]

CLINICAL DATA: Cough and smoking

CHEST - 2 VIEW

[view not recorded (1 of 2)]
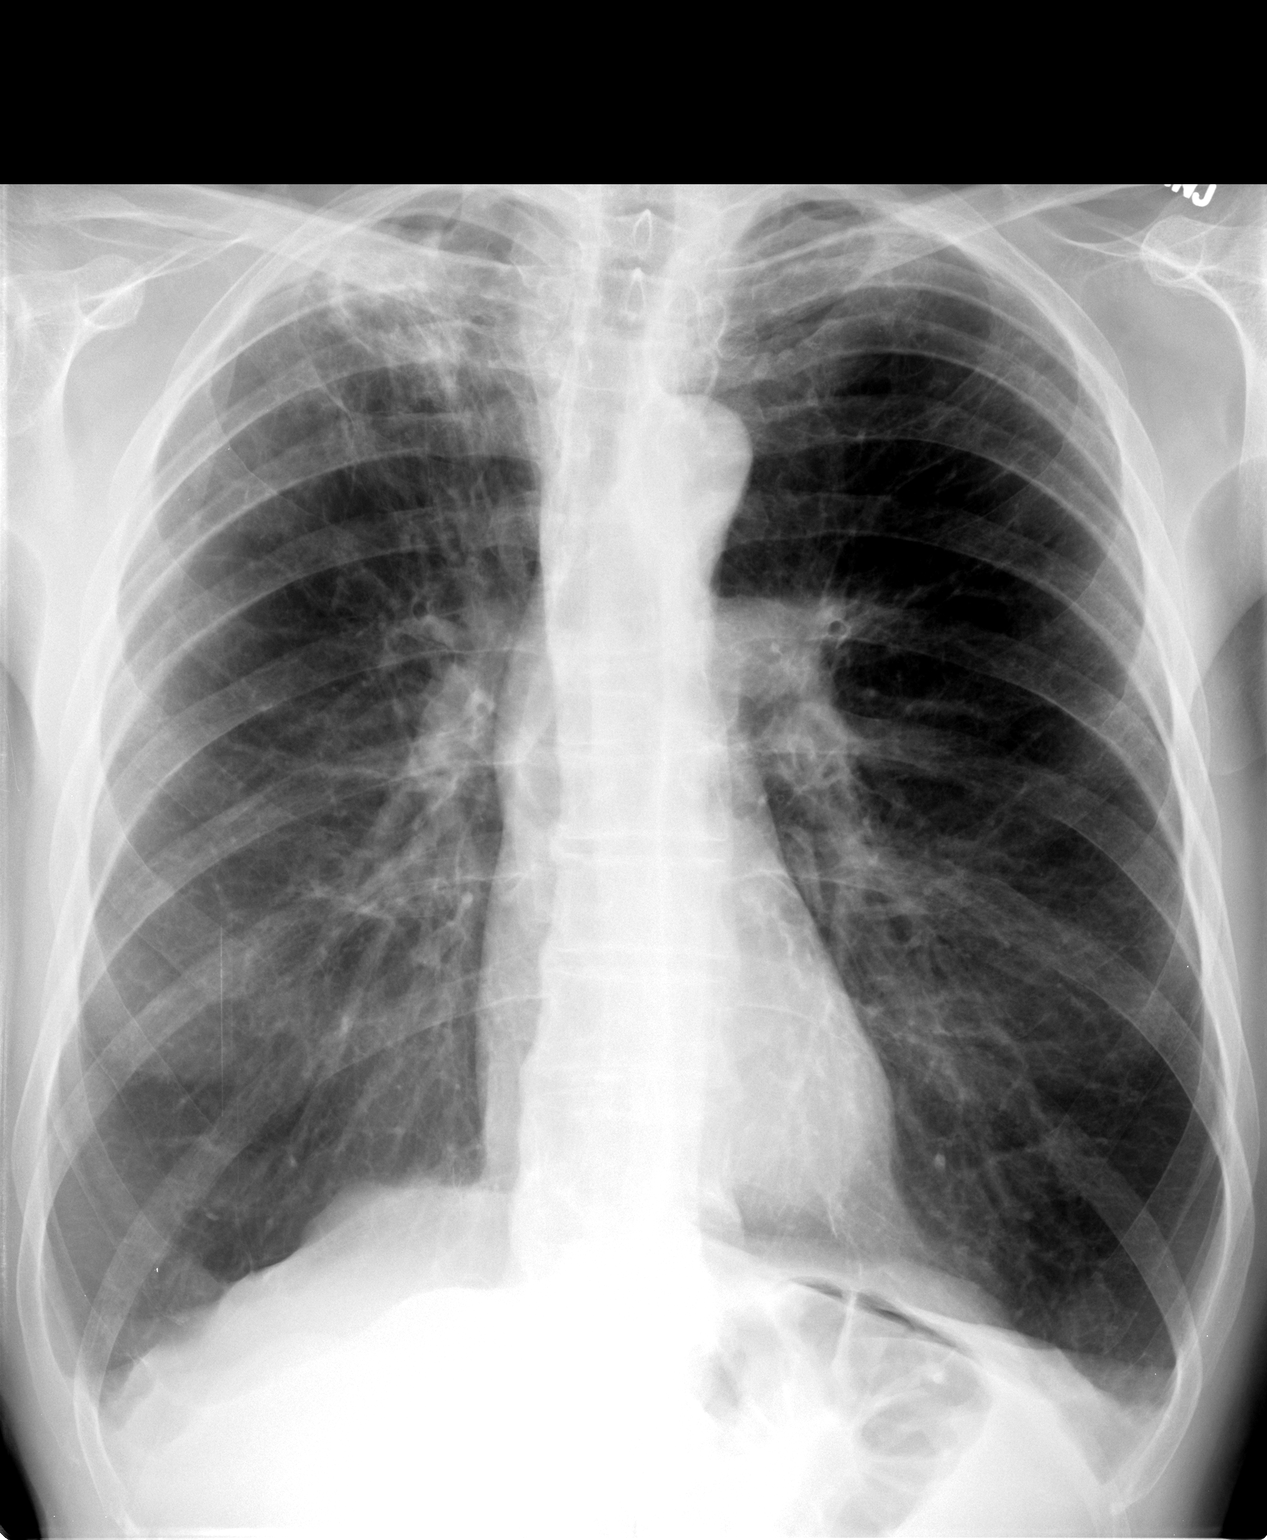

[view not recorded (2 of 2)]
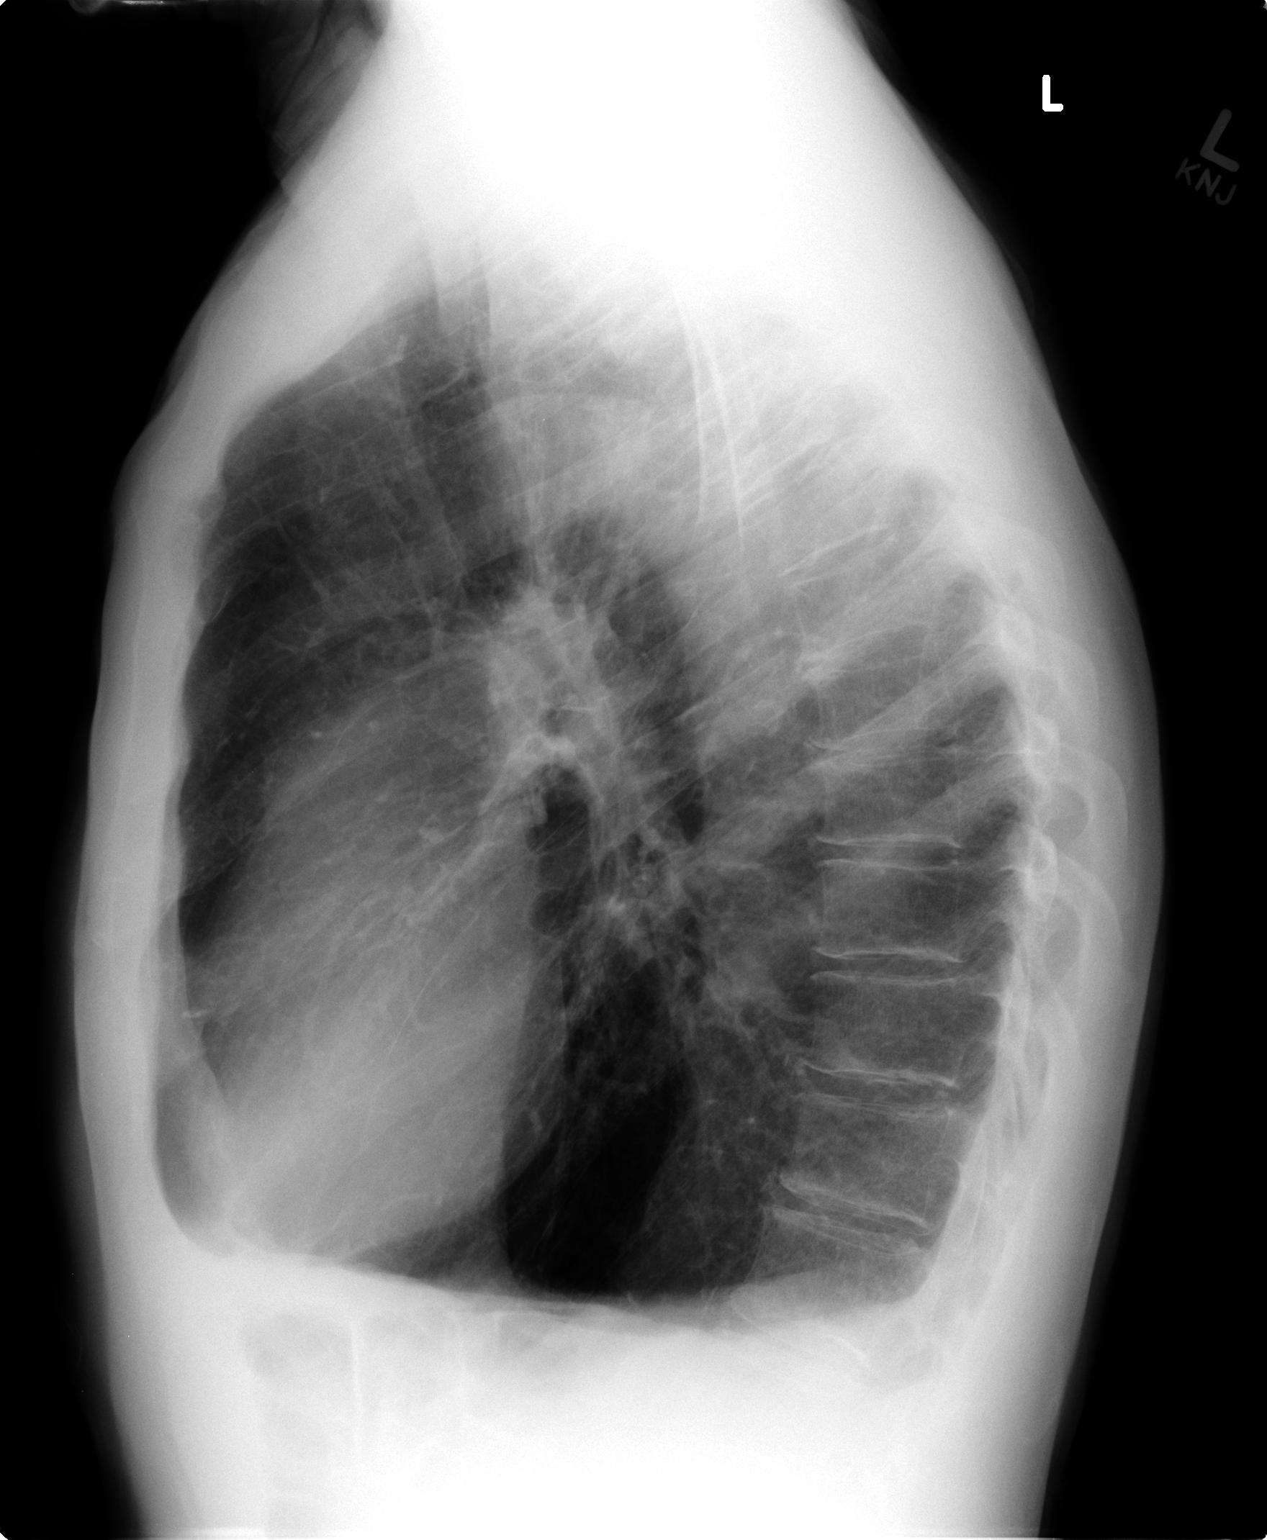

[2 of 2 positions shown; findings below may reference images not displayed]

FINDINGS: Cardiomediastinal silhouette is stable.  Bilateral
hyperinflation again noted.  Stable bilateral apical
pleuroparenchymal scarring right greater than left.  No acute
infiltrate or pleural effusion.  No pulmonary edema.  Mild
degenerative changes thoracic spine are stable.
IMPRESSION: Stable COPD.  No active disease.  Stable bilateral apical
pleuroparenchymal scarring right greater than left.

## 2009-08-01 ENCOUNTER — Ambulatory Visit: Payer: Self-pay | Admitting: Cardiovascular Disease

## 2009-08-01 IMAGING — CT CT PARANASAL SINUSES LIMITED
1 of 2 series · 16 of 28 positions shown, 20 images · non-contrast
Comparison: None

CLINICAL DATA: Fever, cough, sinus drainage

CT PARANASAL SINUS LIMITED WITHOUT CONTRAST
TECHNIQUE: Multidetector CT images of the paranasal sinuses were
obtained in a single plane without contrast.

[Series 4: ltd sinus 3.0 h30s · axial · 0.29mm/px · z∈[-100,+12]mm · 16 of 27 slices shown, 20 images]
[im 2/27  brain]
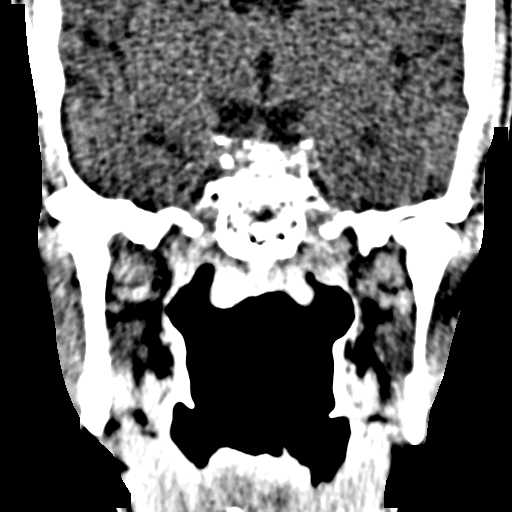
[im 2/27  bone]
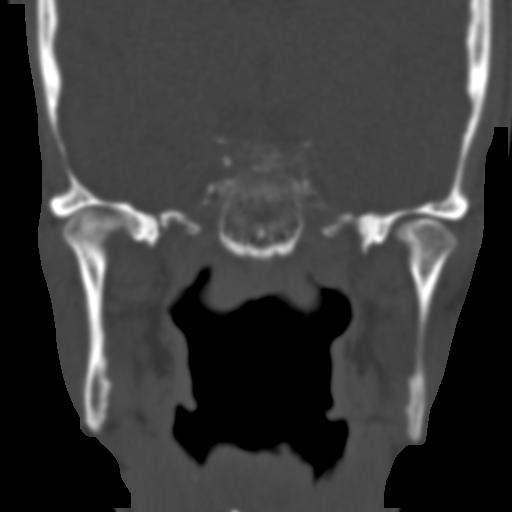
[im 4/27  bone]
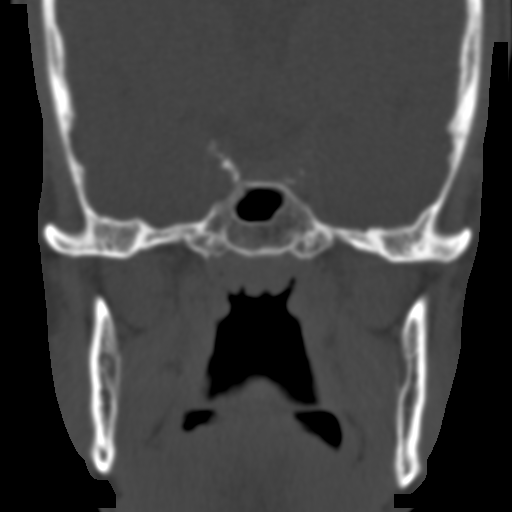
[im 5/27  bone]
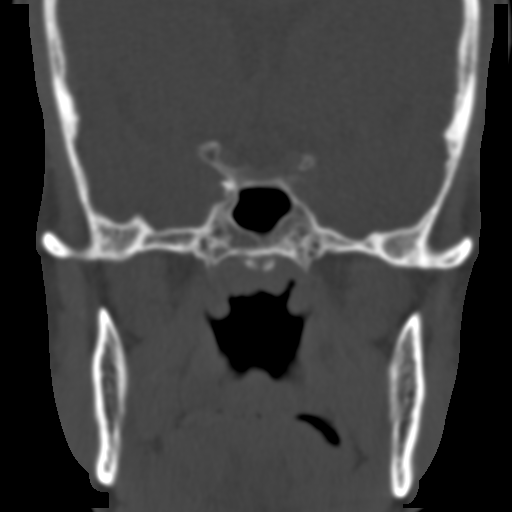
[im 7/27  bone]
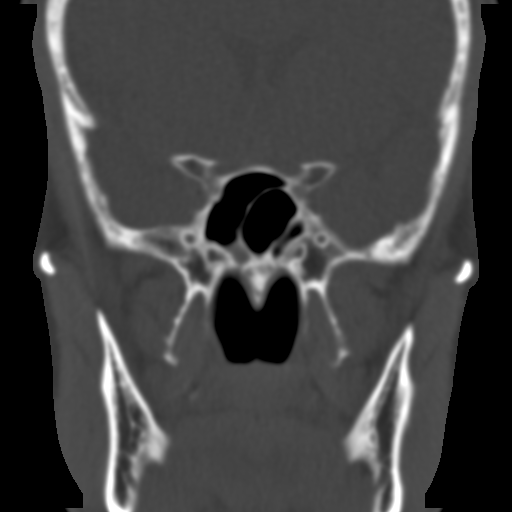
[im 9/27  brain]
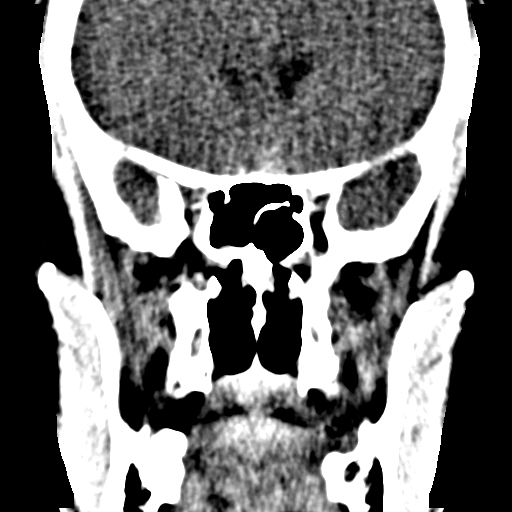
[im 9/27  bone]
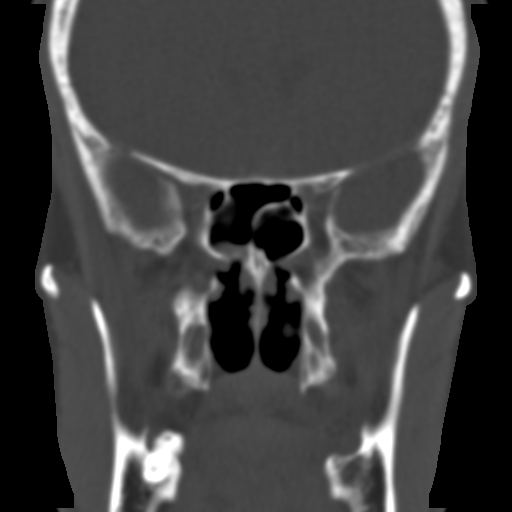
[im 10/27  bone]
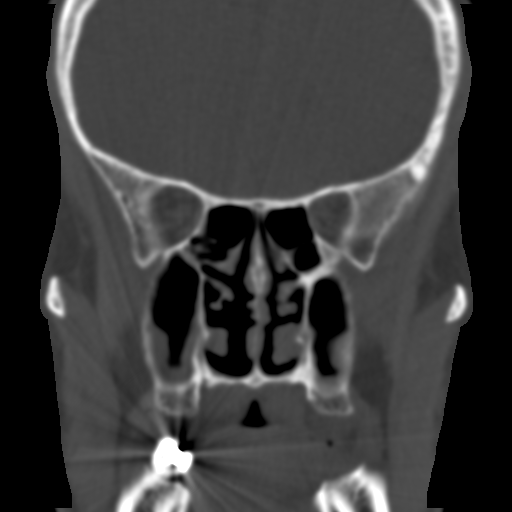
[im 12/27  bone]
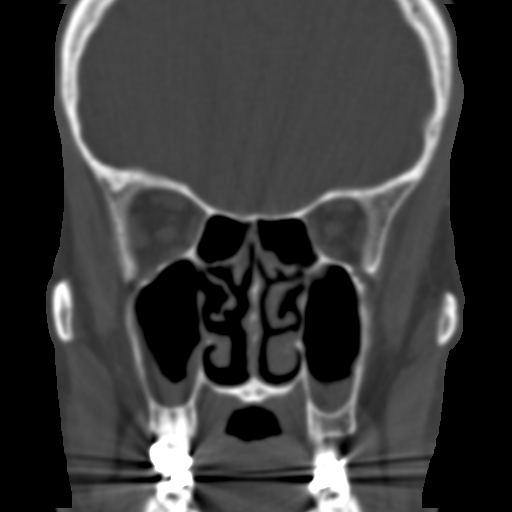
[im 13/27  bone]
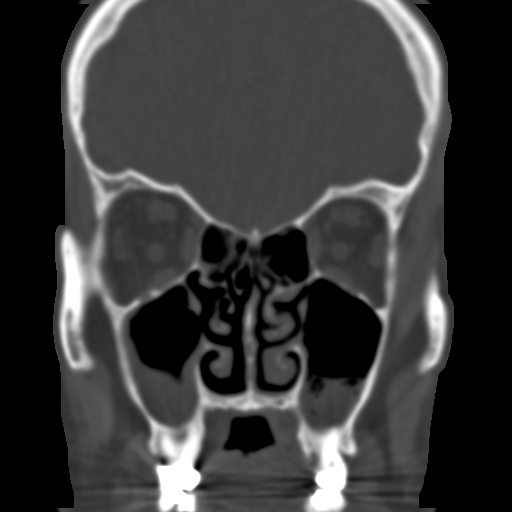
[im 15/27  brain]
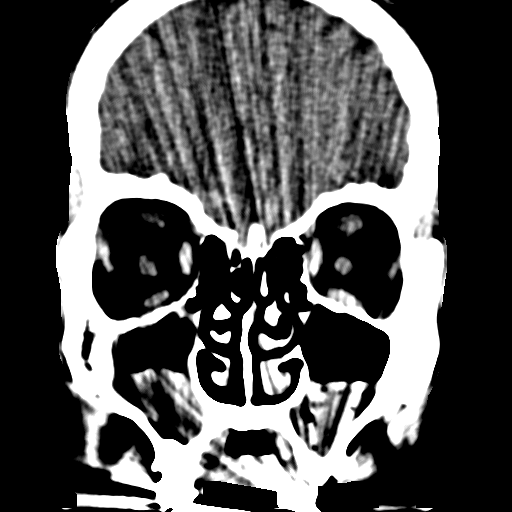
[im 15/27  bone]
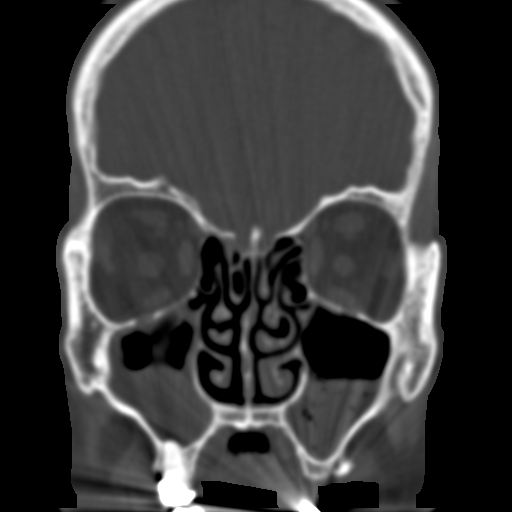
[im 16/27  bone]
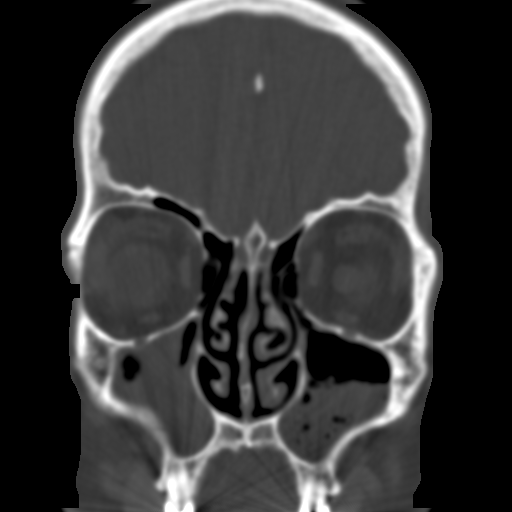
[im 18/27  bone]
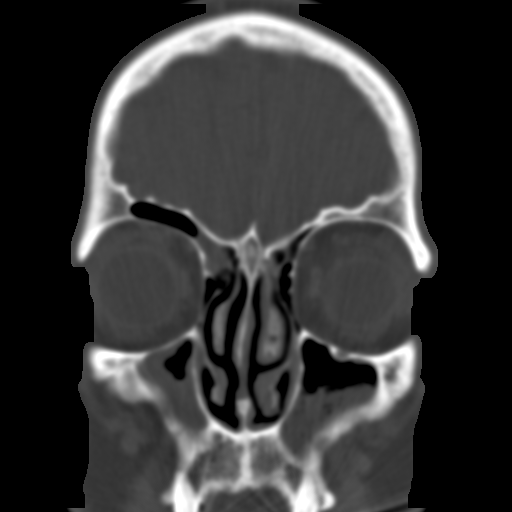
[im 19/27  bone]
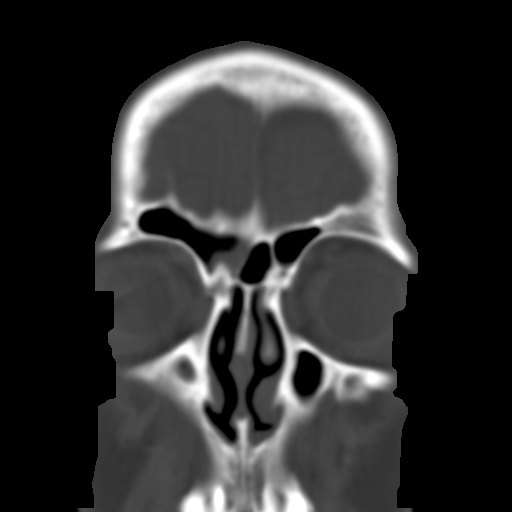
[im 21/27  brain]
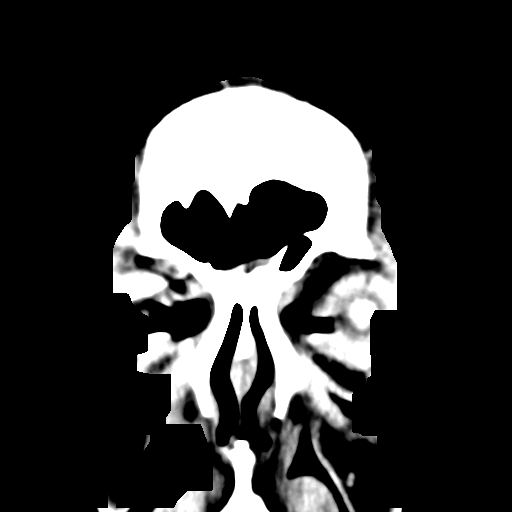
[im 21/27  bone]
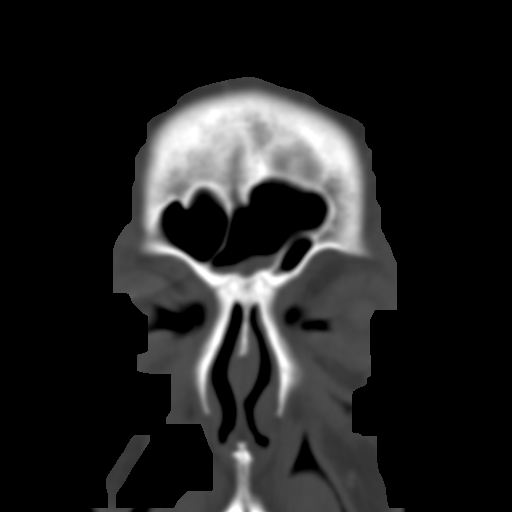
[im 23/27  bone]
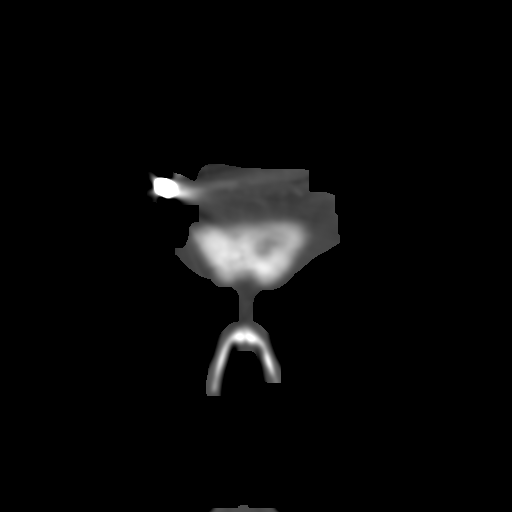
[im 24/27  bone]
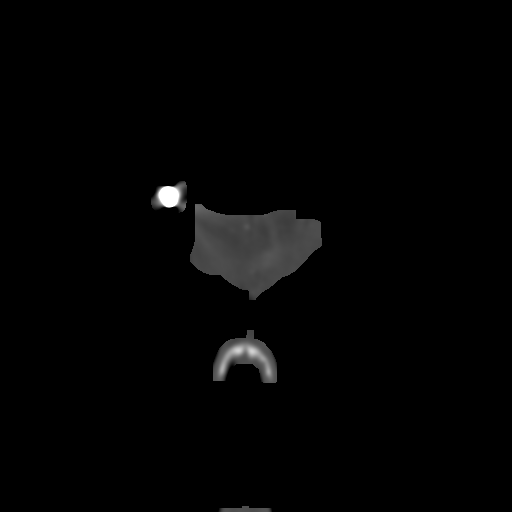
[im 26/27  bone]
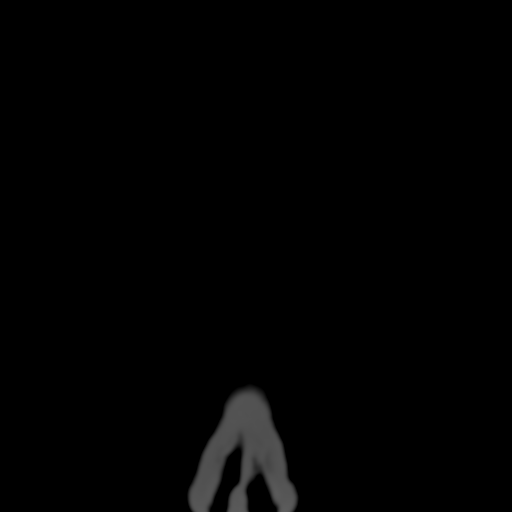

[16 of 28 positions shown; findings below may reference images not displayed]

FINDINGS: There is mucosal thickening with air-fluid levels in both
maxillary sinuses consistent with bilateral maxillary sinusitis.
Some fluid layers within the right partition of the sphenoid sinus
with mucosal thickening in the right frontal sinus consistent with
paranasal sinus disease as well.  The nasal turbinates are normal
in size and position and no significant nasal septal deviation is
seen.  No bony abnormality is noted.
IMPRESSION: Bilateral maxillary sinusitis with some mucosal thickening in the
sphenoid and frontal sinuses as well.

## 2009-08-03 ENCOUNTER — Telehealth (INDEPENDENT_AMBULATORY_CARE_PROVIDER_SITE_OTHER): Payer: Self-pay | Admitting: *Deleted

## 2009-08-04 ENCOUNTER — Ambulatory Visit: Payer: Self-pay | Admitting: Internal Medicine

## 2009-08-29 ENCOUNTER — Ambulatory Visit: Payer: Self-pay | Admitting: Pulmonary Disease

## 2009-09-22 ENCOUNTER — Ambulatory Visit: Payer: Self-pay | Admitting: Pulmonary Disease

## 2009-12-18 ENCOUNTER — Telehealth (INDEPENDENT_AMBULATORY_CARE_PROVIDER_SITE_OTHER): Payer: Self-pay | Admitting: *Deleted

## 2010-02-12 ENCOUNTER — Ambulatory Visit: Payer: Self-pay | Admitting: Internal Medicine

## 2010-02-12 LAB — CONVERTED CEMR LAB
Albumin: 4.1 g/dL (ref 3.5–5.2)
BUN: 14 mg/dL (ref 6–23)
Basophils Absolute: 0 10*3/uL (ref 0.0–0.1)
CO2: 31 meq/L (ref 19–32)
Chloride: 103 meq/L (ref 96–112)
Eosinophils Absolute: 0.2 10*3/uL (ref 0.0–0.7)
GFR calc non Af Amer: 85.27 mL/min (ref >60)
Glucose, Bld: 97 mg/dL (ref 70–99)
Glucose, Urine, Semiquant: NEGATIVE
HCT: 43.8 % (ref 39.0–52.0)
HDL: 86 mg/dL (ref >39.00)
Lymphs Abs: 1.4 10*3/uL (ref 0.7–4.0)
MCHC: 34.1 g/dL (ref 30.0–36.0)
MCV: 98.4 fL (ref 78.0–100.0)
Monocytes Absolute: 0.6 10*3/uL (ref 0.1–1.0)
Neutro Abs: 3.8 10*3/uL (ref 1.4–7.7)
PSA: 0.69 ng/mL (ref 0.10–4.00)
Platelets: 209 10*3/uL (ref 150.0–400.0)
Potassium: 4.7 meq/L (ref 3.5–5.1)
RDW: 13.5 % (ref 11.5–14.6)
Specific Gravity, Urine: 1.01
TSH: 1.21 microintl units/mL (ref 0.35–5.50)
Total Bilirubin: 0.6 mg/dL (ref 0.3–1.2)
WBC Urine, dipstick: NEGATIVE
pH: 7

## 2010-02-21 ENCOUNTER — Ambulatory Visit: Payer: Self-pay | Admitting: Internal Medicine

## 2010-04-02 ENCOUNTER — Encounter: Payer: Self-pay | Admitting: Internal Medicine

## 2010-04-02 LAB — HM COLONOSCOPY

## 2010-05-21 ENCOUNTER — Ambulatory Visit: Payer: Self-pay | Admitting: Internal Medicine

## 2010-05-22 DIAGNOSIS — J019 Acute sinusitis, unspecified: Secondary | ICD-10-CM | POA: Insufficient documentation

## 2010-06-21 ENCOUNTER — Ambulatory Visit: Payer: Self-pay | Admitting: Pulmonary Disease

## 2010-06-21 DIAGNOSIS — J328 Other chronic sinusitis: Secondary | ICD-10-CM | POA: Insufficient documentation

## 2010-07-17 ENCOUNTER — Telehealth: Payer: Self-pay | Admitting: Pulmonary Disease

## 2010-07-27 ENCOUNTER — Encounter: Payer: Self-pay | Admitting: Internal Medicine

## 2010-08-07 NOTE — Progress Notes (Signed)
Summary: CT scan  Phone Note Call from Patient Call back at Home Phone [phone]   Caller: Spouse Call For: wert Summary of Call: spouse wants to make sure that pt's upcoming CT- scheduled for 08/01/09 has been pre- approved (if prior auth was needed).  Initial call taken by: Tivis Ringer,  July 17, 2009 12:41 PM  Follow-up for Phone Call        Can you please tell me if this has been done? Thanks. Reynaldo Minium CMA  July 17, 2009 12:43 PM   Additional Follow-up for Phone Call Additional follow up Details #1::        yes both [employer] ins policy's required and it has been done auth #'s in orders  Additional Follow-up by: Oneita Jolly,  July 17, 2009 2:31 PM

## 2010-08-07 NOTE — Miscellaneous (Signed)
Summary: Orders Update pft charges  Clinical Lists Changes  Orders: Added new Service order of Carbon Monoxide diffusing w/capacity [phone]) - Signed Added new Service order of Lung Volumes (62694) - Signed Added new Service order of Spirometry (Pre & Post) [id number]) - Signed  Appended Document: Orders Update pft charges please let pt know that his breathing studies show severe emphysema.  He is on a good medication regimen for this.  Appended Document: Orders Update pft charges called and spoke with pt.  pt aware of pft results and KC's response.  pt verbalized understanding and denied any questions.

## 2010-08-07 NOTE — Assessment & Plan Note (Signed)
Summary: Acute NP office visit - sinus infection / bronchitis   Copy to:  Birdie Sons Primary Provider/Referring Provider:  Birdie Sons MD  CC:  sinus pressure/congestion, ears stopped up, clear/green nasal drainage, prod cough with same-colored mucus, wheezing, increased SOB, PND w/ sore throat, and occ low grade temp x2-3weeks.  History of Present Illness: 65 yo with known hx of emphysema in active smoker.   May 21, 2010 --Presents for an acute office visit. Complains of sinus pressure/congestion, ears stopped up, clear/green nasal drainage, prod cough with same-colored mucus, wheezing, increased SOB, PND w/ sore throat, occ low grade temp x2-3weeks. OTC not helpful.Denies chest pain,  orthopnea, hemoptysis, fever, n/v/d, edema, headache.   Medications Prior to Update: 1)  Advair Diskus 250-50 Mcg/dose Misc (Fluticasone-Salmeterol) .[name] Kitchen.. 1 Puff Two Times A Day As Needed--Needs Office Visit For Additional Refills 2)  Lamisil 250 Mg Tabs (Terbinafine Hcl) .... One Every Month 3)  Spiriva Handihaler 18 Mcg  Caps (Tiotropium Bromide Monohydrate) .... One Puff in Handihaler Daily 4)  Ventolin Hfa 108 (90 Base) Mcg/act Aers (Albuterol Sulfate) .... 2 Puffs Four Times A Day As Needed Shortness of Breath or Wheezing  Current Medications (verified): 1)  Advair Diskus 250-50 Mcg/dose Misc (Fluticasone-Salmeterol) .[name] Kitchen.. 1 Puff Two Times A Day As Needed--Needs Office Visit For Additional Refills 2)  Lamisil 250 Mg Tabs (Terbinafine Hcl) .... One Every Month 3)  Ventolin Hfa 108 (90 Base) Mcg/act Aers (Albuterol Sulfate) .... 2 Puffs Four Times A Day As Needed Shortness of Breath or Wheezing 4)  Spiriva Handihaler 18 Mcg  Caps (Tiotropium Bromide Monohydrate) .... 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 type father deceased stroke--59 mother-mother deceased 45 yo   heart disease: mother and father   Social History: Last updated: 11/15/2008 Married with children. Patient is a current smoker. Pt first started smoking cigarettes at age 63.  smoked 2 ppd x 10 years.  Now, pt states he smokes a pipe "all day long" x 25 years. pt works as a Veterinary surgeon for [employer].    Risk Factors: Smoking Status: current (02/21/2010) Packs/Day: 1.0 (02/21/2010) Pipe Use/wk: several times daily (02/21/2010)  Review of Systems      See HPI  Vital Signs:  Patient profile:   65 year old male Height:      71 inches Weight:      142.38 pounds BMI:     19.93 O2 Sat:      97 % on Room air Temp:     97.5 degrees F oral Pulse rate:   81 / minute BP sitting:   124 / 68  (left arm) Cuff size:   regular  Vitals Entered By: Boone Master CNA/MA (  May 21, 2010 4:32 PM)  O2 Flow:  Room air CC: sinus pressure/congestion, ears stopped up, clear/green nasal drainage, prod cough with same-colored mucus, wheezing, increased SOB, PND w/ sore throat, occ low grade temp x2-3weeks Is Patient Diabetic? No Comments Medications reviewed with patient Daytime contact number verified with patient. Boone Master CNA/MA  May 21, 2010 4:33 PM    Physical Exam  Additional Exam:  wt 141 > 147 July 17, 2009 > 146 August 05, 2009 >>142 11/14 amb pleasant wm nad HEENT mild turbinate edema.  Oropharynx no thrush or excess pnd or cobblestoning.  No JVD or cervical adenopathy. Mild accessory muscle hypertrophy. Trachea midline, nl thryroid. Chest was hyperinflated by percussion with diminished breath sounds and moderate increased exp time without wheeze. Hoover sign positive at mid inspiration. Regular rate and rhythm without murmur gallop or rub or increase P2 or edema.  Abd: no hsm, nl excursion. Ext warm without cyanosis or clubbing.     Impression & Recommendations:  Problem # 1:  ACUTE SINUSITIS, UNSPECIFIED (ICD-461.9)  sinus flare  and asscoiated bronchitis  Plan  Augmentin 875mg  two times a day for 10 days  Mucinex DM two times a day as needed cough/congestion  Increase fluids and rest  Saline nasal rinses as needed  Hydromet 1-2 tsp every 4-6 hr as needed cough-may make you sleepy  Please contact office for sooner follow up if symptoms do not improve or worsen  follow up [name] in 4 weeks and as needed   His updated medication list for this problem includes:    Augmentin 875-125 Mg Tabs (Amoxicillin-pot clavulanate) .[name] Kitchen... 1 by mouth two times a day    Hydromet 5-1.5 Mg/42ml Syrp (Hydrocodone-homatropine) .[name] Kitchen... 1-2 tsp every 4-6 hrs as needed cough/congestion , may make you sleepy.  Orders: Est. Patient Level IV (14782)  Medications Added to Medication List This Visit: 1)  Augmentin 875-125 Mg Tabs (Amoxicillin-pot clavulanate) .[name] Kitchen.. 1 by mouth two times a day 2)  Hydromet 5-1.5 Mg/62ml Syrp (Hydrocodone-homatropine) .[name] Kitchen.. 1-2 tsp every 4-6 hrs as needed cough/congestion , may make you sleepy.  Complete Medication List: 1)  Advair Diskus 250-50 Mcg/dose Misc (Fluticasone-salmeterol) .[name] Kitchen.. 1 puff two times a day as needed--needs office visit for additional refills 2)  Lamisil  250 Mg Tabs (Terbinafine hcl) .... One every month 3)  Spiriva Handihaler 18 Mcg Caps (Tiotropium bromide monohydrate) .... One puff in handihaler daily 4)  Ventolin Hfa 108 (90 Base) Mcg/act Aers (Albuterol sulfate) .... 2 puffs four times a day as needed shortness of breath or wheezing 5)  Augmentin 875-125 Mg Tabs (Amoxicillin-pot clavulanate) .[name] Kitchen.. 1 by mouth two times a day 6)  Hydromet 5-1.5 Mg/21ml Syrp (Hydrocodone-homatropine) .[name] Kitchen.. 1-2 tsp every 4-6 hrs as needed cough/congestion , may make you sleepy.  Other Orders: Nebulizer Tx (95621)  Patient Instructions: 1)  Augmentin 875mg  two times a day fpr 10 days  2)  Mucinex DM two times a day as needed cough/congestion  3)  Increase fluids and rest  4)  Saline nasal rinses as needed  5)  Hydromet 1-2 tsp every 4-6 hr as needed cough-may make you sleepy  6)  Please contact office for sooner follow up if symptoms do not improve or worsen  7)  follow up [name] in 4 weeks and as needed  Prescriptions: HYDROMET 5-1.5 MG/5ML SYRP (HYDROCODONE-HOMATROPINE) 1-2 tsp every 4-6 hrs as needed cough/congestion , may make you sleepy.  #8 oz x 0    Entered and Authorized  by:   Rubye Oaks NP   Signed by:   Tammy Parrett NP on 05/21/2010   Method used:   Print then Give to Patient   RxID:   [id number] AUGMENTIN 875-125 MG TABS (AMOXICILLIN-POT CLAVULANATE) 1 by mouth two times a day  #20 x 0   Entered and Authorized by:   Rubye Oaks NP   Signed by:   Tammy Parrett NP on 05/21/2010   Method used:   Electronically to        [facility]  [address] [id number] (ret[facility])       [address]       [place], [place]  [address]       Ph: [phone]       Fax: [phone]   RxID:   [id number]    Immunization History:  Influenza Immunization History:    Influenza:  historical (03/22/2010)

## 2010-08-07 NOTE — Progress Notes (Signed)
Summary: returned call  Phone Note Call from Patient Call back at [phone]   Caller: Patient Call For: wert Summary of Call: returning Leslie's call. Initial call taken by: Darletta Moll,  August 03, 2009 1:49 PM  Follow-up for Phone Call        Pt states he just wants to follow-up with MW. MW when do you want this pt to follow-up? Please advise. Carron Curie CMA  August 03, 2009 1:55 PM next week is fine, bring all active meds including otcs to office  Follow-up by: Nyoka Cowden MD,  August 03, 2009 3:28 PM  Additional Follow-up for Phone Call Additional follow up Details #1::        Patient sch to see MW on 08/04/09 with all meds in hand and will review ct sinus at that time. Additional Follow-up by: Michel Bickers CMA,  August 03, 2009 3:40 PM

## 2010-08-07 NOTE — Assessment & Plan Note (Signed)
Summary: Pulmonary/ ext f/u  sinusitis start nasonex  rx augmentin x 20d   Copy to:  Birdie Sons Primary Provider/Referring Provider:  Birdie Sons MD  CC:  Followup to discuss sinus ct results.  Pt still c/o cough- prod with clear sputum. He also still feels some facial pressure onm the right side and right ear "feels stuffy"..  History of Present Illness: 6 yowm remote cigarette and active pipe smoker with  history of of emphysema  12/16/08-- acute sick visit.  He has known emphysema, and a RUL airspace disease that has been slowly getting better radiographically and clinically.  He was due to f/u cxr next month, but began to develop malaise, low grade fever, and dry cough.  He was seen by Dr. Cato Mulligan, and a cxr revealed new airspace disease in the LUL with minimal further improvement in the RUL process.  He has been treated with abx, and feels better.  He never did bring up purulent mucus, but did have left scapular pain with deep inspiration.--Set up for Bronchoscopy.   February 07, 2009--Complains of increased cough, congestion, -mianly clear, nasal drainage and wheezing over last 1 week, worse last 2 days. Denies chest pain, dyspnea, orthopnea, hemoptysis, fever, n/v/d, edema, headache. OTC not helping > underwent Bronchoscopy 12/22/08 which was neg for malignancy and cx were neg.   July 17, 2009 Acute visit.  Pt c/o chest congestion and cough x 3 days.  Cough is non prod.  Pt also c/o PND.  Fever comes and goes.  He states that this am he felt that his breathing was "more labored"  but not using as needed ventolin much at all.  rec augmentin, cycle of prednisone and fu ct sinus.  August 04 2009 Followup to discuss sinus ct results.  Pt still c/o cough- prod with clear sputum. He also still feels some facial pressure on  the right side and right ear "feels stuffy".  breathing better. Pt denies any significant sore throat, dysphagia, itching, sneezing,  nasal congestion or excess or purulent  secretions,  fever, chills, sweats, unintended wt loss, pleuritic or exertional cp, hempoptysis, change in activity tolerance  orthopnea pnd or leg swelling     Current Medications (verified): 1)  Advair Diskus 250-50 Mcg/dose Misc (Fluticasone-Salmeterol) .[name] Kitchen.. 1 Puff Two Times A Day As Needed 2)  Lamisil 250 Mg Tabs (Terbinafine Hcl) .... One Every Month 3)  Ventolin Hfa 108 (90 Base) Mcg/act Aers (Albuterol Sulfate) .... 2 Puffs Four Times A Day As Needed Shortness of Breath or Wheezing 4)  Spiriva Handihaler 18 Mcg  Caps (Tiotropium Bromide Monohydrate) .... One Puff in Handihaler Daily 5)  Mucinex Dm 30-600 Mg Xr12h-Tab (Dextromethorphan-Guaifenesin) .[name] Kitchen.. 1 Every 12 Hours As Needed 6)  Tramadol Hcl 50 Mg  Tabs (Tramadol Hcl) .... One To Two By Mouth Every 4-6 Hours As Needed For Cough or Pain  Allergies (verified): No Known Drug Allergies  Past History:  Past Medical History: onychomycosis---dr jones Myocardial infarction, hx of---stress cardiolyte 2004 CHEST XRAY, ABNORMAL (ICD-793.1) WEIGHT LOSS (ICD-783.21) DYSPNEA/SHORTNESS OF BREATH (ICD-786.09) TOBACCO USE (ICD-305.1) COPD (ICD-496)     - PFT's rec July 17, 2009  MYOCARDIAL INFARCTION, HX OF (STRESS CARDIOLYTE-2004) (ICD-412) COLONIC POLYPS, HX OF (ICD-V12.72) CHRONIC RHINITIS     - Sinus Ct  08/01/2009 >> Bilateral maxillary sinusitis with some mucosal thickening in the sphenoid and frontal sinuses as well with air fluid levels present     - Chronic Rhinitis Flyer August 04, 2009  Vital Signs:  Patient profile:   65 year old male Weight:      146.38 pounds O2 Sat:      96 % on Room air Temp:     97.7 degrees F oral Pulse rate:   89 / minute BP sitting:   118 / 64  (left arm)  Vitals Entered By: Vernie Murders (August 04, 2009 4:06 PM)  O2 Flow:  Room air  Physical Exam  Additional Exam:  wt 141 > 147 July 17, 2009 > 146 August 05, 2009  amb pleasant wm nad HEENT mild turbinate edema.   Oropharynx no thrush or excess pnd or cobblestoning.  No JVD or cervical adenopathy. Mild accessory muscle hypertrophy. Trachea midline, nl thryroid. Chest was hyperinflated by percussion with diminished breath sounds and moderate increased exp time without wheeze. Hoover sign positive at mid inspiration. Regular rate and rhythm without murmur gallop or rub or increase P2 or edema.  Abd: no hsm, nl excursion. Ext warm without cyanosis or clubbing.     Impression & Recommendations:  Problem # 1:  COPD (ICD-496)    DDX of  difficult airways managment all start with A and  include Adherence, Ace Inhibitors, Acid Reflux, Active Sinus Disease, Alpha 1 Antitripsin deficiency, Anxiety masquerading as Airways dz,  ABPA,  allergy(esp in young), Aspiration (esp in elderly), Adverse effects of DPI,  Active smokers, plus one B  = Beta blocker use.[name] Kitchen    Active sinusitis strongly suggested as well as Active smoking major suspect.  Adverse effect of DPI less likely because he does fine for long stretches between flares.  No change maint rx and f/u with Dr Shelle Iron planned  Problem # 2:  TOBACCO USE (ICD-305.1)  discussed.   I took this opportunity to educate the patient regarding the consequences of smoking in airway disorders based on all the data we have from the multiple national lung health studies indicating that smoking cessation, not choice of inhalers or physicians, is the most important aspect of care.    Orders: Est. Patient Level IV (16109)  Problem # 3:  CHRONIC RHINITIS (ICD-472.0)  I had an extended discussion with the patient today lasting 15 to 20 minutes of a 25 minute visit on the following issues:  He's not convinced he has sinus problems because he can breath thru his nose fine. I reviewed both graphic and text formatted material regarding the diagnosis and management of rhinitis including how to use topical steroids effectively and why it is necessary to treat nasal obstruction and  inflammation and infection allat  the same time to eradicate the cycle of inflammation causing obstruction causing an infection causing inflammation   See instructions for specific recommendations   Orders: Est. Patient Level IV (60454)  Medications Added to Medication List This Visit: 1)  Nasonex 50 Mcg/act Susp (Mometasone furoate) .... Two puffs each nostril twice daily taper to one at bedtime 2)  Augmentin 875-125 Mg Tabs (Amoxicillin-pot clavulanate) .... By mouth twice daily  Patient Instructions: 1)  Take 10-20 days of augmentin and if not better you to see a sinus specialist (ENT) 2)  if better taper the nasonex to one at bedtime. 3)  I emphasized that nasal steroids have no immediate benefit in terms of improving symptoms.  To help them reached the target tissue, the patient should use Afrin two puffs every 12 hours applied one min before using the nasal steroids.  Afrin should be stopped after no more than 5 days.  If  the symptoms worsen, Afrin can be restarted after 5 days off of therapy to prevent rebound congestion from overuse of Afrin.  I also emphasized that in no way are nasal steroids a concern in terms of "addiction". 4)    Prescriptions: AUGMENTIN 875-125 MG  TABS (AMOXICILLIN-POT CLAVULANATE) By mouth twice daily  #20 x 1   Entered and Authorized by:   Nyoka Cowden MD   Signed by:   Nyoka Cowden MD on 08/04/2009   Method used:   [facility] to        [facility]  [address] [phone]* (retail)       [address]       [place], [place]  [address]       Ph: [phone]       Fax: [phone]   RxID:   [id number] NASONEX 50 MCG/ACT  SUSP (MOMETASONE FUROATE) Two puffs each nostril twice daily taper to one at bedtime  #1 x 11   Entered and Authorized by:   Nyoka Cowden MD   Signed by:   Nyoka Cowden MD on 08/04/2009   Method used:   [facility]        [facility]  [address] [phone][facility] (retail)       [address]       [place], [place]  [address]       Ph: [phone]       Fax: [phone]   RxID:   [id number]

## 2010-08-07 NOTE — Assessment & Plan Note (Signed)
Summary: rov for sinusitis, emphysema   Copy to:  Birdie Sons Primary Provider/Referring Provider:  Birdie Sons MD  CC:  Follow up.  pt states breathing is doing "good."  states he has wheezing at night-states this has been going on for a long time-has never stopped.  states he occasionally has a cough-prod with clear mucus.  Denies chest tightness.  states sinuses are better but started to have pressure in right ear and right check x2days-denies drainage.  states he is still smoking-10 pipes/day.[name] Kitchen  History of Present Illness: The pt comes in today for f/u of his known emphysema.  He is getting over a bout of acute sinusitis the end of last month and feels better, but still has some pressure in his paranasal sinus area and right ear.  He has occasional cough with no purulence, and that his exertional tolerance level is stable.  He is having classic upper airway pseudowheezing that I suspect is coming from postnasal drip  Current Medications (verified): 1)  Advair Diskus 250-50 Mcg/dose Misc (Fluticasone-Salmeterol) .[name] Kitchen.. 1 Puff Two Times A Day As Needed 2)  Lamisil 250 Mg Tabs (Terbinafine Hcl) .... One Every Month 3)  Ventolin Hfa 108 (90 Base) Mcg/act Aers (Albuterol Sulfate) .... 2 Puffs Four Times A Day As Needed Shortness of Breath or Wheezing 4)  Spiriva Handihaler 18 Mcg  Caps (Tiotropium Bromide Monohydrate) .... One Puff in Handihaler Daily 5)  Mucinex Dm 30-600 Mg Xr12h-Tab (Dextromethorphan-Guaifenesin) .[name] Kitchen.. 1 Every 12 Hours As Needed 6)  Tramadol Hcl 50 Mg  Tabs (Tramadol Hcl) .... One To Two By Mouth Every 4-6 Hours As Needed For Cough or Pain 7)  Augmentin 875-125 Mg  Tabs (Amoxicillin-Pot Clavulanate) .... By Mouth Twice Daily  Allergies (verified): No Known Drug Allergies  Review of Systems      See HPI  Vital Signs:  Patient profile:   65 year old male Height:      73 inches Weight:      145.13 pounds BMI:     19.22 O2 Sat:      97 % on Room air Temp:     98.0  degrees F oral Pulse rate:   82 / minute BP sitting:   114 / 68  (right arm) Cuff size:   regular  Vitals Entered By: Gweneth Dimitri RN (August 29, 2009 9:34 AM)  O2 Flow:  Room air CC: Follow up.  pt states breathing is doing "good."  states he has wheezing at night-states this has been going on for a long time-has never stopped.  states he occasionally has a cough-prod with clear mucus.  Denies chest tightness.  states sinuses are better but started to have pressure in right ear and right check x2days-denies drainage.  states he is still smoking-10 pipes/day. Comments Medications reviewed with patient Daytime contact number verified with patient. Gweneth Dimitri RN  August 29, 2009 9:34 AM    Physical Exam  General:  thin male in nad Nose:  no purulence noted. Lungs:  decreased bs throughout, no wheezing a few rhonchi noted. Heart:  rrr Extremities:  no edema or cyanosis Neurologic:  alert and oriented, moves all 4.   Impression & Recommendations:  Problem # 1:  SINUSITIS, CHRONIC (ICD-473.9) I suspect the pt has chronic sinusitis, and may be the cause of his recurrent pulmonary infections as well.  He feels he is developing an acute flareup at this time.  Will start on abx again (bad sinusitis frequently requires 3 weeks abx), but  I think he needs to see ENT.  He is a little hesitant to do this, and will therefore give him one more try at improvement.  If he has recurrent symptoms in a short period of time, or doesn't fully clear, he agrees to the referral.  Problem # 2:  EMPHYSEMA (ICD-492.8) the pt seems to be stable from a pulmonary standpoint.  We have never really established a diagnosis with pfts, due to recurrent sinopulmonary infections.  Will set up for pfts to document disease process and severity.  Other Orders: Est. Patient Level III (43329) Pulmonary Referral (Pulmonary)  Patient Instructions: 1)  start augmentin, along with neilmed sinus rinses. 2)  If you  fail to completely clear, or if quick recurrence, you will need referral to ENT 3)  no change in pulmonary meds. 4)  will schedule for pfts (breathing studies) 5)  followup with me in 6mos if doing well.   Immunization History:  Influenza Immunization History:    Influenza:  historical (05/08/2009)

## 2010-08-07 NOTE — Procedures (Signed)
Summary: Colonoscopy Report-Dr. Sharrell Ku  Colonoscopy Report-Dr. Sharrell Ku   Imported By: Maryln Gottron 04/12/2010 13:09:32  _____________________________________________________________________  External Attachment:    Type:   Image     Comment:   External Document

## 2010-08-07 NOTE — Progress Notes (Signed)
Summary: sick appt  Phone Note Call from Patient Call back at Home Phone [phone]   Caller: Spouse Call For: Francisco [name] Reason for Call: Talk to Nurse Summary of Call: cough, congestion, fever, non prod., ribs and stomach hurt from coughing so much.  Would like to see KC this morning. Has appt sch w/ TP in pm. Initial call taken by: Eugene Gavia,  July 17, 2009 8:13 AM  Follow-up for Phone Call        Please advise as all appts are taken for this am; or if any other dr could see him for you. Otherwise I can tell pt to go to Urgent Care or wait to see TP this afternoon if we are still open. Thanks. Reynaldo Minium CMA  July 17, 2009 8:23 AM   Additional Follow-up for Phone Call Additional follow up Details #1::        my schedule is full.  Can bring him in and can wait for an opening, or see if someone else can see him before the snow starts. Additional Follow-up by: Barbaraann Share MD,  July 17, 2009 8:56 AM    Additional Follow-up for Phone Call Additional follow up Details #2::    Pt will be here with MW for 940am appt.Reynaldo Minium CMA  July 17, 2009 9:00 AM

## 2010-08-07 NOTE — Assessment & Plan Note (Signed)
Summary: cpx/cjr   Vital Signs:  Patient profile:   65 year old male Height:      71 inches Weight:      139.5 pounds BMI:     19.53 Pulse rate:   76 / minute Pulse rhythm:   regular Resp:     12 per minute BP sitting:   126 / 70  (left arm) Cuff size:   regular  Vitals Entered By: Gladis Riffle, RN (February 21, 2010 10:17 AM) CC: cpx, labs done Is Patient Diabetic? No   Primary Care Provider:  Birdie Sons MD  CC:  cpx and labs done.  History of Present Illness: CPX  copd---continues to smoke--- has dyspnea when climbs a flight of stairs  Preventive Screening-Counseling & Management  Alcohol-Tobacco     Smoking Status: current     Smoking Cessation Counseling: yes     Packs/Day: 1.0     Year Started: 1967     Pipe use/week: several times daily  Current Problems (verified): 1)  Chest Xray, Abnormal  (ICD-793.1) 2)  Tobacco Use  (ICD-305.1) 3)  COPD  (ICD-496) 4)  Myocardial Infarction, Hx of (STRESS CARDIOLYTE-2004)  (ICD-412) 5)  Colonic Polyps, Hx of  (ICD-V12.72)  Current Medications (verified): 1)  Advair Diskus 250-50 Mcg/dose Misc (Fluticasone-Salmeterol) .[name] Kitchen.. 1 Puff Two Times A Day As Needed--Needs Office Visit For Additional Refills 2)  Lamisil 250 Mg Tabs (Terbinafine Hcl) .... One Every Month 3)  Ventolin Hfa 108 (90 Base) Mcg/act Aers (Albuterol Sulfate) .... 2 Puffs Four Times A Day As Needed Shortness of Breath or Wheezing 4)  Spiriva Handihaler 18 Mcg  Caps (Tiotropium Bromide Monohydrate) .... One Puff in Handihaler Daily  Allergies (verified): No Known Drug Allergies  Past History:  Past Medical History: Last updated: 08/04/2009 onychomycosis---dr jones Myocardial infarction, hx of---stress cardiolyte 2004 CHEST XRAY, ABNORMAL (ICD-793.1) WEIGHT LOSS (ICD-783.21) DYSPNEA/SHORTNESS OF BREATH (ICD-786.09) TOBACCO USE (ICD-305.1) COPD (ICD-496)     - PFT's rec July 17, 2009  MYOCARDIAL INFARCTION, HX OF (STRESS CARDIOLYTE-2004)  (ICD-412) COLONIC POLYPS, HX OF (ICD-V12.72) CHRONIC RHINITIS     - Sinus Ct  08/01/2009 >> Bilateral maxillary sinusitis with some mucosal thickening in the sphenoid and frontal sinuses as well with air fluid levels present     - Chronic Rhinitis Flyer August 04, 2009       Past Surgical History: Last updated: 08/23/2008 Appendectomy Tonsillectomy  Family History: Last updated: 11/15/2008 Family History of CAD Male 1st degree relative <60-sister CABG sister--hepatitis---? type father deceased stroke--59 mother-mother deceased 25 yo   heart disease: mother and father   Social History: Last updated: 11/15/2008 Married with children. Patient is a current smoker. Pt first started smoking cigarettes at age 11.  smoked 2 ppd x 10 years.  Now, pt states he smokes a pipe "all day long" x 25 years. pt works as a Veterinary surgeon for [employer].    Risk Factors: Smoking Status: current (02/21/2010) Packs/Day: 1.0 (02/21/2010) Pipe Use/wk: several times daily (02/21/2010)  Social History: Packs/Day:  1.0  Physical Exam  General:  thin male in nad Head:  normocephalic and atraumatic.   Eyes:  pupils equal and pupils round.   Ears:  R ear normal and L ear normal.   Neck:  no jvd, tmg, LN Chest Wall:  No deformities, masses, tenderness or gynecomastia noted. Lungs:  decreased bs throughout, no wheezing Heart:  normal rate, regular rhythm, and no gallop.   Abdomen:  soft and non-tender.   Rectal:  no external abnormalities and no masses.   Prostate:  no nodules and no asymmetry.   Msk:  No deformity or scoliosis noted of thoracic or lumbar spine.   Pulses:  R radial normal and L radial normal.   Neurologic:  cranial nerves II-XII intact and gait normal.   Skin:  turgor normal and color normal.   Psych:  normally interactive and good eye contact.     Impression & Recommendations:  Problem # 1:  PREVENTIVE HEALTH CARE (ICD-V70.0) health maint utd reviewed labs  will  get colonoscopy records from dr Kinnie Scales  Problem # 2:  TOBACCO USE (ICD-305.1)  Encouraged smoking cessation and discussed different methods for smoking cessation.   Problem # 3:  COPD (ICD-496) followed by pulmonary His updated medication list for this problem includes:    Advair Diskus 250-50 Mcg/dose Misc (Fluticasone-salmeterol) .[name] Kitchen... 1 puff two times a day as needed--needs office visit for additional refills    Ventolin Hfa 108 (90 Base) Mcg/act Aers (Albuterol sulfate) .[name] Kitchen... 2 puffs four times a day as needed shortness of breath or wheezing    Spiriva Handihaler 18 Mcg Caps (Tiotropium bromide monohydrate) ..... One puff in handihaler daily  Complete Medication List: 1)  Advair Diskus 250-50 Mcg/dose Misc (Fluticasone-salmeterol) .[name] Kitchen.. 1 puff two times a day as needed--needs office visit for additional refills 2)  Lamisil 250 Mg Tabs (Terbinafine hcl) .... One every month 3)  Ventolin Hfa 108 (90 Base) Mcg/act Aers (Albuterol sulfate) .... 2 puffs four times a day as needed shortness of breath or wheezing 4)  Spiriva Handihaler 18 Mcg Caps (Tiotropium bromide monohydrate) .... One puff in handihaler daily  Other Orders: Tdap => 74yrs IM (44010) Admin 1st Vaccine (27253)  Patient Instructions: 1)  Tobacco is very bad for your health and your loved ones! You Should stop smoking!.     Immunizations Administered:  Tetanus Vaccine:    Vaccine Type: Tdap    Site: left deltoid    Mfr: GlaxoSmithKline    Dose: 0.5 ml    Route: IM    Given by: Gladis Riffle, RN    Exp. Date: 04/26/2012    Lot #: [id number]

## 2010-08-07 NOTE — Progress Notes (Signed)
Summary: prescript  Phone Note Call from Patient Call back at [phone]   Caller: Patient Call For: clance Summary of Call: pt would like advair prescript change from Dr Timoteo Gaul to Dr Earnest Rosier [address] Initial call taken by: Rickard Patience,  December 18, 2009 8:16 AM  Follow-up for Phone Call        Pt wants to know if [facility] will agree to refill his adviar. he was originally getting it through Dr. Cato Mulligan. Please advise if ok to refill. Carron Curie CMA  December 18, 2009 9:01 AM   Additional Follow-up for Phone Call Additional follow up Details #1::        ok with me Additional Follow-up by: Barbaraann Share MD,  December 18, 2009 5:28 PM    Additional Follow-up for Phone Call Additional follow up Details #2::    Sent RX to drug store and pt is aware.Reynaldo Minium CMA  December 18, 2009 5:30 PM   Prescriptions: ADVAIR DISKUS 250-50 MCG/DOSE MISC (FLUTICASONE-SALMETEROL) 1 puff two times a day as needed--NEEDS OFFICE VISIT FOR ADDITIONAL REFILLS  #60 x 5   Entered by:   Reynaldo Minium CMA   Authorized by:   Barbaraann Share MD   Signed by:   Reynaldo Minium CMA on 12/18/2009   Method used:   [facility]  [address] [phone] ([facility]       [address]       [place], [place]  [address]       Ph: [phone]       Fax: [phone]   RxID:   [id number]

## 2010-08-07 NOTE — Miscellaneous (Signed)
Summary: scrotal ultrasound---extratesticular cyst  US Scrotum. - STATUS: Final  IMAGE                                     Perform Date: 26Jun07 14:11  Ordered By: Marisue Ivan,          Ordered Date: 26Jun07 13:21  Facility: [facility]                              Department: [place]  Service Report Text  GDC Accession Number: [id number]    Clinical Data:  Right scrotal mass for 1 month.   SCROTAL ULTRASOUND:   Technique:  Complete ultrasound examination of the testicles,   epididymis, and other scrotal structures was performed.   Findings:  The testes are fairly symmetric in size, measuring 5.3 x   2.6 x 3.0 cm on the right and 4.9 x 2.1 x 2.9 cm on the left.  No   suspicious testicular findings are present.  Superiorly in the left   testis is a probable tunica albuginea cyst measuring 4 mm in greatest   dimension.   Corresponding with the patient's palpable concern in the right   scrotum is an extratesticular cyst measuring 3.9 x 2.0 x 2.9 cm.   This somewhat deforms the testis and is probably arising from the   right epididymis.  There are several additional smaller epididymal   cysts bilaterally.  No hydrocele or varicocele is seen.   IMPRESSION:   1.  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   2.  There are no suspicious testicular findings.  A probable   incidental tunica cyst is demonstrated on the left.    Read By:  Gerrianne Scale,  M.D.   Released By:  Gerrianne Scale,  M.D.  Additional Information  External image : [phone]

## 2010-08-07 NOTE — Assessment & Plan Note (Signed)
Summary: Pulmonary/ ext ov rx augmentin and pred x 6 days   Copy to:  Birdie Sons Primary Provider/Referring Provider:  Birdie Sons MD  CC:  Acute visit.  Pt c/o chest congestion and cough x 3 days.  Cough is non prod.  Pt also c/o PND.  Fever comes and goes.  He states that this am he felt that his breathing was "more labored"..  History of Present Illness: 43 yowm remote cigarette and active pipe smoker with  history of of emphysema  12/16/08-- acute sick visit.  He has known emphysema, and a RUL airspace disease that has been slowly getting better radiographically and clinically.  He was due to f/u cxr next month, but began to develop malaise, low grade fever, and dry cough.  He was seen by Dr. Cato Mulligan, and a cxr revealed new airspace disease in the LUL with minimal further improvement in the RUL process.  He has been treated with abx, and feels better.  He never did bring up purulent mucus, but did have left scapular pain with deep inspiration.--Set up for Bronchoscopy.   February 07, 2009--Complains of increased cough, congestion, -mianly clear, nasal drainage and wheezing over last 1 week, worse last 2 days. Denies chest pain, dyspnea, orthopnea, hemoptysis, fever, n/v/d, edema, headache. OTC not helping > underwent Bronchoscopy 12/22/08 which was neg for malignancy and cx were neg.   July 17, 2009 Acute visit.  Pt c/o chest congestion and cough x 3 days.  Cough is non prod.  Pt also c/o PND.  Fever comes and goes.  He states that this am he felt that his breathing was "more labored"  but not using as needed ventolin much at all.  Pt denies any significant sore throat, dysphagia, itching, sneezing,  nasal congestion or excess secretions,  fever, chills, sweats, unintended wt loss, pleuritic or exertional cp, hempoptysis, orthopnea pnd or leg swelling. Pt also denies any obvious fluctuation in symptoms with weather or environmental change or other alleviating or aggravating factors.        Current Medications (verified): 1)  Advair Diskus 250-50 Mcg/dose Misc (Fluticasone-Salmeterol) .[name] Kitchen.. 1 Puff Two Times A Day As Needed 2)  Lamisil 250 Mg Tabs (Terbinafine Hcl) .... One Every Month 3)  Ventolin Hfa 108 (90 Base) Mcg/act Aers (Albuterol Sulfate) .... 2 Puffs Four Times A Day As Needed Shortness of Breath or Wheezing 4)  Spiriva Handihaler 18 Mcg  Caps (Tiotropium Bromide Monohydrate) .... One Puff in Handihaler Daily 5)  Mucinex Dm 30-600 Mg Xr12h-Tab (Dextromethorphan-Guaifenesin) .[name] Kitchen.. 1 Every 12 Hours As Needed  Allergies (verified): No Known Drug Allergies  Past History:  Past Medical History: onychomycosis---dr jones Myocardial infarction, hx of---stress cardiolyte 2004 CHEST XRAY, ABNORMAL (ICD-793.1) WEIGHT LOSS (ICD-783.21) DYSPNEA/SHORTNESS OF BREATH (ICD-786.09) TOBACCO USE (ICD-305.1) COPD (ICD-496)     - PFT's rec July 17, 2009  MYOCARDIAL INFARCTION, HX OF (STRESS CARDIOLYTE-2004) (ICD-412) COLONIC POLYPS, HX OF (ICD-V12.72) CHRONIC RHINITIS     - Sinus Ct Rec for 07/31/2009 >>    Vital Signs:  Patient profile:   65 year old male Weight:      147 pounds O2 Sat:      96 % on Room air Temp:     98.0 degrees F oral Pulse rate:   74 / minute BP sitting:   120 / 70  (left arm)  Vitals Entered By: Vernie Murders (July 17, 2009 9:54 AM)  O2 Flow:  Room air  Physical Exam  Additional Exam:  wt  141 > 147 July 17, 2009 amb mildly acutely ill amb wm HEENT mild turbinate edema.  Oropharynx no thrush or excess pnd or cobblestoning.  No JVD or cervical adenopathy. Mild accessory muscle hypertrophy. Trachea midline, nl thryroid. Chest was hyperinflated by percussion with diminished breath sounds and moderate increased exp time without wheeze. Hoover sign positive at mid inspiration. Regular rate and rhythm without murmur gallop or rub or increase P2 or edema.  Abd: no hsm, nl excursion. Ext warm without cyanosis or clubbing.     CXR  Procedure  date:  07/17/2009  Findings:      Comparison: 03/03/2009   Findings: Cardiomediastinal silhouette is stable.  Bilateral hyperinflation again noted.  Stable bilateral apical pleuroparenchymal scarring right greater than left.  No acute infiltrate or pleural effusion.  No pulmonary edema.  Mild degenerative changes thoracic spine are stable.   IMPRESSION: Stable COPD.  No active disease.  Stable bilateral apical pleuroparenchymal scarring right greater than le  Impression & Recommendations:  Problem # 1:  COPD (ICD-496) DDX of  difficult airways managment all start with A and  include Adherence, Ace Inhibitors, Acid Reflux, Active Sinus Disease, Alpha 1 Antitripsin deficiency, Anxiety masquerading as Airways dz,  ABPA,  allergy(esp in young), Aspiration (esp in elderly), Adverse effects of DPI,  Active smokers, plus one B  = Beta blocker use.[name] Kitchen    Active sinusitis strongly suggested as well as Active smoking major suspect.  Adverse effect of DPI less likely because he does fine for long stretches between flares.  Strongly rec sinus ct after rx of acute flare and also PFT's  Problem # 2:  TOBACCO USE (ICD-305.1) > 3 min discussion   I took this opportunity to educate the patient regarding the consequences of smoking in airway disorders based on all the data we have from the multiple national lung health studies indicating that smoking cessation, not choice of inhalers or physicians, is the most important aspect of care.  Not ready to committ but considering it  Medications Added to Medication List This Visit: 1)  Mucinex Dm 30-600 Mg Xr12h-tab (Dextromethorphan-guaifenesin) .[name] Kitchen.. 1 every 12 hours as needed 2)  Augmentin 875-125 Mg Tabs (Amoxicillin-pot clavulanate) .... By mouth twice daily 3)  Prednisone 10 Mg Tabs (Prednisone) .... 4 each am x 2days, 2x2days, 1x2days and stop 4)  Tramadol Hcl 50 Mg Tabs (Tramadol hcl) .... One to two by mouth every 4-6 hours as needed for cough or  pain  Other Orders: Est. Patient Level IV (81191) Misc. Referral (Misc. Ref) T-2 View CXR (71020TC)  Patient Instructions: 1)  Augmentin twice daily for 10 days 2)  See Patient Care Coordinator before leaving for scheduling a ct of sinuses to be done in 2 weeks not sooner 3)  Take mucinex dm  every 12 hours and add tramadol 50 mg up to every 4 hours to suppress the urge to cough. Swallowing water or using ice chips/non mint and menthol containing candies (such as lifesavers or sugarless jolly ranchers) are also effective.  4)  GERD (REFLUX)  is a common cause of respiratory symptoms. It commonly presents without heartburn and can be treated with medication, but also with lifestyle changes including avoidance of late meals, excessive alcohol, smoking cessation, and avoid fatty foods, chocolate, peppermint, colas, red wine, and acidic juices such as orange juice. NO MINT OR MENTHOL PRODUCTS SO NO COUGH DROPS  5)  USE SUGARLESS CANDY INSTEAD (jolley ranchers)  6)  NO OIL BASED VITAMINS  7)  Prednisone 10 mg 4 each am x 2days, 2x2days, 1x2days and stop  8)  NEEDS F/U WITH [facility] WITH PFT'S AFTER RX Prescriptions: TRAMADOL HCL 50 MG  TABS (TRAMADOL HCL) One to two by mouth every 4-6 hours as needed for cough or pain  #40 x 0   Entered and Authorized by:   Nyoka Cowden MD   Signed by:   Nyoka Cowden MD on 07/17/2009   Method used:   Electronically to        [facility] [phone][facility] (retail)       [address]       [place], [place]  [address]       Ph: [phone]       Fax: [phone]   RxID:   [id number] PREDNISONE 10 MG  TABS (PREDNISONE) 4 each am x 2days, 2x2days, 1x2days and stop  #14 x 0   Entered and Authorized by:   Nyoka Cowden MD   Signed by:   Nyoka Cowden MD on 07/17/2009   Method used:   [facility]        [facility]  [address] [phone]* (retail)       [address]       [place], [place]  [address]       Ph:  [phone]       Fax: [phone]   RxID:   [id number] AUGMENTIN 875-125 MG  TABS (AMOXICILLIN-POT CLAVULANATE) By mouth twice daily  #20 x 0   Entered and Authorized by:   Nyoka Cowden MD   Signed by:   Nyoka Cowden MD on 07/17/2009   Method used:   [facility]        [facility]  [address] [phone]* (retail)       [address]       [place], [place]  [address]       Ph: [phone]       Fax: [phone]   RxID:   [id number]

## 2010-08-09 NOTE — Assessment & Plan Note (Signed)
Summary: rov for emphysema/asthma, recurrent sinusitis.   Visit Type:  Follow-up Copy to:  Birdie Sons Primary Provider/Referring Provider:  Birdie Sons MD  CC:  4 week follow up. pt states he has his "good" and 'bad' days. pt c/o cough w/ clear phlem, wheezing, and chest congestion. pt surrently smoking a pipe. pt states he needs refills for his meds. .  History of Present Illness: the pt comes in today for f/u of his known severe airflow obstruction that is felt to be due to emphysema with possible asthmatic component.  He was recently seen by our NP for acute sinobronchitis, and he is definitely improved since that time.  He is still having some persistent sinus congestion with postnasal drip.  With regards to his breathing, his symptoms and exercise tolerance are variable.  He has a cough, but no purulence, but does describe classic upper airway pseudowheezing.    Current Medications (verified): 1)  Advair Diskus 250-50 Mcg/dose Misc (Fluticasone-Salmeterol) .[name] Kitchen.. 1 Puff Two Times A Day 2)  Lamisil 250 Mg Tabs (Terbinafine Hcl) .... One Every Month 3)  Spiriva Handihaler 18 Mcg  Caps (Tiotropium Bromide Monohydrate) .... One Puff in Handihaler Daily 4)  Ventolin Hfa 108 (90 Base) Mcg/act Aers (Albuterol Sulfate) .... 2 Puffs Four Times A Day As Needed Shortness of Breath or Wheezing 5)  Hydromet 5-1.5 Mg/66ml Syrp (Hydrocodone-Homatropine) .[name] Kitchen.. 1-2 Tsp Every 4-6 Hrs As Needed Cough/congestion , May Make You Sleepy.  Allergies (verified): No Known Drug Allergies  Review of Systems       The patient complains of shortness of breath with activity, productive cough, nasal congestion/difficulty breathing through nose, and change in color of mucus.  The patient denies shortness of breath at rest, non-productive cough, coughing up blood, chest pain, irregular heartbeats, acid heartburn, indigestion, loss of appetite, weight change, abdominal pain, difficulty swallowing, sore throat, tooth/dental  problems, headaches, sneezing, itching, ear ache, anxiety, depression, hand/feet swelling, joint stiffness or pain, rash, and fever.    Vital Signs:  Patient profile:   65 year old male Height:      71 inches Weight:      145 pounds BMI:     20.30 O2 Sat:      98 % on Room air Temp:     97.8 degrees F oral Pulse rate:   76 / minute BP sitting:   122 / 74  (left arm) Cuff size:   regular  Vitals Entered By: Carver Fila (June 21, 2010 9:08 AM)  O2 Flow:  Room air CC: 4 week follow up. pt states he has his "good" and 'bad' days. pt c/o cough w/ clear phlem, wheezing, chest congestion. pt surrently smoking a pipe. pt states he needs refills for his meds.  Comments meds and allergies updated Phone number updated Carver Fila  June 21, 2010 9:08 AM    Physical Exam  General:  thin male in nad Nose:  no purulence or discharge noted. Lungs:  rhonchi but no true wheezing positive upper airway pseudowheezing Heart:  rrr, no mrg Extremities:  no edema or cyanosis Neurologic:  alert and oriented, moves all 4.   Impression & Recommendations:  Problem # 1:  COPD (ICD-496) the pt has severe airflow obstruction by his pfts, and I suspect this is primarily related to emphysema with an asthmatic component.  He is on a good bronchodilator regimen, but I have asked him to stop smoking his pipe.  I have also encouraged him to stay as active as  possible.  Problem # 2:  OTHER CHRONIC SINUSITIS (ICD-473.8) the pt is having recurrent sinus issues, and at this point think he would benefit from an ENT evaluation.  Unclear how much his pipe smoking may be contributing to this.  Medications Added to Medication List This Visit: 1)  Advair Diskus 250-50 Mcg/dose Misc (Fluticasone-salmeterol) .[name] Kitchen.. 1 puff two times a day  Other Orders: Est. Patient Level III (16109) ENT Referral (ENT)  Patient Instructions: 1)  continue current pulmnonary medications.  Refills called in 2)  stop smoking  pipe 3)  will refer to ENT for evaluation of your sinuses 4)  followup with me in 4mos.   Prescriptions: SPIRIVA HANDIHALER 18 MCG  CAPS (TIOTROPIUM BROMIDE MONOHYDRATE) one puff in handihaler daily  #30 Capsule x 6   Entered and Authorized by:   Barbaraann Share MD   Signed by:   Barbaraann Share MD on 06/21/2010   Method used:   [facility]        [facility] [phone]* (retail)       [address]       [place], [place]  [address]       Ph: [phone]       Fax: [phone]   RxID:   [id number] ADVAIR DISKUS 250-50 MCG/DOSE MISC (FLUTICASONE-SALMETEROL) 1 puff two times a day  #1 x 6   Entered and Authorized by:   Barbaraann Share MD   Signed by:   Barbaraann Share MD on 06/21/2010   Method used:   [facility]        [facility]  [address] [phone]* (retail)       [address]       [place], [place]  [address]       Ph: [phone]       Fax: [phone]   RxID:   [id number]

## 2010-08-09 NOTE — Progress Notes (Signed)
Summary: requesting refills  Phone Note Call from Patient Call back at Home Phone [phone]   Caller: Spouse Call For: Clance Reason for Call: Talk to Nurse, Talk to Doctor Summary of Call: Pt's spouse here to see Dr. Maple Hudson dropped off a note attached to a [facility] form stating "Have [name] mail me new prescription so I can mail these Prescriptions to [facility] with necessary paperwork. Need prescriptions for each individual drug-"   I called pt's home # and lmomtcb, need to get medication names to be refilled, no names on papers.  Initial call taken by: Zackery Barefoot CMA,  July 17, 2010 9:19 AM  Follow-up for Phone Call        During check out pt's spouse stated pt needs refills on Spiriva and Advair 250/50 for [facility]. Mrs. Detwiler was given same before leaving. Zackery Barefoot CMA  July 17, 2010 9:50 AM     Prescriptions: SPIRIVA HANDIHALER 18 MCG  CAPS (TIOTROPIUM BROMIDE MONOHYDRATE) one puff in handihaler daily  #90 capsule x 1   Entered by:   Zackery Barefoot CMA   Authorized by:   Barbaraann Share MD   Signed by:   Zackery Barefoot CMA on 07/17/2010   Method used:   Print then Give to Patient   RxID:   [id number] ADVAIR DISKUS 250-50 MCG/DOSE MISC (FLUTICASONE-SALMETEROL) 1 puff two times a day  #3 x 1   Entered by:   Zackery Barefoot CMA   Authorized by:   Barbaraann Share MD   Signed by:   Zackery Barefoot CMA on 07/17/2010   Method used:   Print then Give to Patient   RxID:   [id number]

## 2010-08-15 NOTE — Consult Note (Signed)
Summary: [facility]  [facility]   Imported By: Maryln Gottron 08/08/2010 09:03:35  _____________________________________________________________________  External Attachment:    Type:   Image     Comment:   External Document

## 2010-10-15 LAB — FUNGAL STAIN
Fungal Smear: NONE SEEN
Special Requests: ABNORMAL

## 2010-10-15 LAB — AFB STAIN

## 2010-10-15 LAB — AFB CULTURE WITH SMEAR (NOT AT ARMC): Special Requests: ABNORMAL

## 2010-10-15 LAB — FUNGUS CULTURE W SMEAR: Fungal Smear: NONE SEEN

## 2010-10-15 LAB — CULTURE, RESPIRATORY W GRAM STAIN: Special Requests: ABNORMAL

## 2010-11-20 NOTE — Op Note (Signed)
NAME:  Francisco [name], Francisco [name] NO.:  [id number]   MEDICAL RECORD NO.:  [id number]          PATIENT TYPE:  AMB   LOCATION:  CARD                         FACILITY:  [facility]   PHYSICIAN:  Leslye Peer, MD    DATE OF BIRTH:  08/31/1945   DATE OF PROCEDURE:  12/22/2008  DATE OF DISCHARGE:                               OPERATIVE REPORT   PROCEDURE:  Fiberoptic bronchoscopy with bronchoalveolar lavage and  transbronchial brushings.   OPERATOR:  Leslye Peer, MD   INDICATIONS:  Right upper lobe cavitary lesion.   MEDICATIONS GIVEN:  Included fentanyl 75 mcg IV in divided doses, Versed  3 mg IV in divided doses and lidocaine 1% for a total of 22 mL to the  bronchoalveolar tree.   CONSENT:  Informed consent was obtained from the patient.  A signed copy  is on his hospital chart.   PROCEDURE DETAILS:  After informed consent was obtained and a time-out  was performed, the patient was given conscious sedation as indicated  above.  The bronchoscope was introduced through the left naris without  difficulty.  The posterior pharynx was anesthetized with 1% lidocaine.  There was some white plaquing noted in the posterior pharynx.  The  trachea was intubated without difficulty.  The main carina was sharp.  Airway inspection showed normal bilateral mainstem bronchi.  The left  upper lobe lingula and left lower lobe airways were all within normal  limits without any endobronchial lesions or abnormal secretions.  The  right upper lobe, right middle lobe and right lower lobe airways were  similarly normal in appearance.  There again were no secretions or  lesions seen.  Under fluoroscopic guidance, transbronchial brushings  were obtained from the apical and posterior segments of the right upper  lobe to be sent for both cytology and microbiology including AFB  staining and fungal staining.  Finally bronchoalveolar lavage was  performed from the right upper lobe to be sent for  cytology, AFB, fungal  and bacterial culture.  The patient tolerated the procedure well.  There  were no obvious complications.  There was no blood loss.  He returned to  the recovery room in good condition.   SAMPLES:  1. Bronchial brushings from the apical and posterior segments of the      right upper lobe.  2. Bronchial washings from the right upper lobe.   PLANS:  The patient will follow up with Dr. Marcelyn Bruins to review his  culture and cytology results.      Leslye Peer, MD  Electronically Signed     RSB/MEDQ  D:  12/22/2008  T:  12/22/2008  Job:  [id number]   cc:   Barbaraann Share, MD,FCCP  [address]  [place]  [place] [address]

## 2010-12-26 ENCOUNTER — Other Ambulatory Visit: Payer: Self-pay | Admitting: Pulmonary Disease

## 2010-12-27 ENCOUNTER — Other Ambulatory Visit: Payer: Self-pay | Admitting: Pulmonary Disease

## 2011-01-29 ENCOUNTER — Encounter: Payer: Self-pay | Admitting: Internal Medicine

## 2011-01-31 ENCOUNTER — Other Ambulatory Visit (INDEPENDENT_AMBULATORY_CARE_PROVIDER_SITE_OTHER): Payer: 59

## 2011-01-31 DIAGNOSIS — Z0000 Encounter for general adult medical examination without abnormal findings: Secondary | ICD-10-CM

## 2011-01-31 LAB — BASIC METABOLIC PANEL
CO2: 29 mEq/L (ref 19–32)
Calcium: 9 mg/dL (ref 8.4–10.5)
Creatinine, Ser: 1 mg/dL (ref 0.4–1.5)
GFR: 85 mL/min (ref >60.00)
Glucose, Bld: 92 mg/dL (ref 70–99)
Sodium: 137 mEq/L (ref 135–145)

## 2011-01-31 LAB — POCT URINALYSIS DIPSTICK
Ketones, UA: NEGATIVE
Leukocytes, UA: NEGATIVE
Protein, UA: NEGATIVE
Urobilinogen, UA: 0.2
pH, UA: 5.5

## 2011-01-31 LAB — CBC WITH DIFFERENTIAL/PLATELET
Basophils Absolute: 0 10*3/uL (ref 0.0–0.1)
Eosinophils Absolute: 0.2 10*3/uL (ref 0.0–0.7)
Hemoglobin: 15.2 g/dL (ref 13.0–17.0)
Lymphocytes Relative: 20.3 % (ref 12.0–46.0)
MCHC: 33.3 g/dL (ref 30.0–36.0)
Monocytes Relative: 10.3 % (ref 3.0–12.0)
Neutrophils Relative %: 66.6 % (ref 43.0–77.0)
Platelets: 219 10*3/uL (ref 150.0–400.0)
RDW: 13.5 % (ref 11.5–14.6)

## 2011-01-31 LAB — LIPID PANEL
HDL: 76.3 mg/dL (ref >39.00)
LDL Cholesterol: 111 mg/dL — ABNORMAL HIGH (ref 0–99)
Total CHOL/HDL Ratio: 3
VLDL: 12.4 mg/dL (ref 0.0–40.0)

## 2011-01-31 LAB — HEPATIC FUNCTION PANEL
AST: 27 U/L (ref 0–37)
Albumin: 4.3 g/dL (ref 3.5–5.2)
Alkaline Phosphatase: 78 U/L (ref 39–117)
Bilirubin, Direct: 0.1 mg/dL (ref 0.0–0.3)
Total Bilirubin: 0.7 mg/dL (ref 0.3–1.2)

## 2011-02-07 ENCOUNTER — Ambulatory Visit (INDEPENDENT_AMBULATORY_CARE_PROVIDER_SITE_OTHER): Payer: 59 | Admitting: Pulmonary Disease

## 2011-02-07 ENCOUNTER — Encounter: Payer: Self-pay | Admitting: Pulmonary Disease

## 2011-02-07 VITALS — BP 122/70 | HR 83 | Temp 98.0°F | Ht 71.0 in | Wt 144.6 lb

## 2011-02-07 DIAGNOSIS — J449 Chronic obstructive pulmonary disease, unspecified: Secondary | ICD-10-CM

## 2011-02-07 MED ORDER — ALBUTEROL SULFATE HFA 108 (90 BASE) MCG/ACT IN AERS: 2.0000 | INHALATION_SPRAY | Freq: Four times a day (QID) | RESPIRATORY_TRACT | Status: DC | PRN

## 2011-02-07 MED ORDER — FLUTICASONE PROPIONATE 50 MCG/ACT NA SUSP: 2.0000 | Freq: Every day | NASAL | Status: DC

## 2011-02-07 NOTE — Progress Notes (Signed)
  Subjective:    Patient ID: Francisco [name], male    DOB: 1946/06/05, 65 y.o.   MRN: [id number]  HPI The pt comes in today for f/u of his known severe copd.  He is staying on his BD regimen, and feels his exertional tolerance is near his usual baseline.  He had an episode of acute/chronic sinusitis earlier in the year, and was treated by ENT.  He has not had a pulmonary infection or acute exacerbation since the last visit.    Review of Systems  Constitutional: Negative for fever and unexpected weight change.  HENT: Positive for rhinorrhea. Negative for ear pain, nosebleeds, congestion, sore throat, sneezing, trouble swallowing, dental problem, postnasal drip and sinus pressure.   Eyes: Negative for redness and itching.  Respiratory: Positive for cough, shortness of breath and wheezing. Negative for chest tightness.   Cardiovascular: Negative for palpitations and leg swelling.  Gastrointestinal: Negative for nausea and vomiting.  Genitourinary: Negative for dysuria.  Musculoskeletal: Negative for joint swelling.  Skin: Negative for rash.  Neurological: Negative for headaches.  Hematological: Bruises/bleeds easily.  Psychiatric/Behavioral: Negative for dysphoric mood. The patient is not nervous/anxious.        Objective:   Physical Exam Thin male in nad Nares without obvious discharge or purulence Chest with decreased bs, a few rhonchi, no wheezing Cor with rrr LE without edema, no cyanosis  Alert and oriented, moves all 4        Assessment & Plan:

## 2011-02-07 NOTE — Patient Instructions (Signed)
No change in meds Think about referral to pulmonary rehab program at [facility].  Let me know followup with me in 6mos if doing well.

## 2011-02-07 NOTE — Assessment & Plan Note (Signed)
The pt is near his usual baseline from a pulmonary standpoint, but is having issues with heat and humidity currently.  He has not had an acute exac or recent pulmonary infection, and his sinuses are doing well after his flareup earlier in the year.  He is on a good BD regimen.  I have asked him to consider referral to pulmonary rehab at [facility], and he will think about it.  I have asked him to stay active, and to f/u with me in 6mos if doing well.

## 2011-02-08 ENCOUNTER — Encounter: Payer: Self-pay | Admitting: Internal Medicine

## 2011-02-08 ENCOUNTER — Ambulatory Visit (INDEPENDENT_AMBULATORY_CARE_PROVIDER_SITE_OTHER): Payer: 59 | Admitting: Internal Medicine

## 2011-02-08 VITALS — BP 154/90 | HR 88 | Temp 98.7°F | Ht 71.0 in | Wt 142.0 lb

## 2011-02-08 DIAGNOSIS — J449 Chronic obstructive pulmonary disease, unspecified: Secondary | ICD-10-CM

## 2011-02-08 NOTE — Assessment & Plan Note (Signed)
Check Alpha 1 antitrypsin today

## 2011-02-08 NOTE — Progress Notes (Signed)
  Subjective:    Patient ID: Francisco [name], male    DOB: Jul 23, 1945, 65 y.o.   MRN: [id number]  HPI  cpx  Severe COPD---followed by dr. clance--needs alpha-1 antitrypsin  Past Medical History  Diagnosis Date  . Onychomycosis     Dr. Melvyn Novas  . Myocardial infarction 2004    hx---stress cardiolyte  . Chest x-ray abnormality   . Weight loss   . Shortness of breath dyspnea   . Tobacco user   . COPD (chronic obstructive pulmonary disease)     PFT's rec Jul 17, 2009  . Old myocardial infarction   . Chronic rhinitis     -Sinus Ct 08/01/2009 >> Bilateral maxillary sinusitis with some mucosal thickeningin the sphenoid and frontal sinuses as well with air fluid levels present -chronic rhinitis flyer Aug 04, 2009   Past Surgical History  Procedure Date  . Appendectomy   . Tonsillectomy     reports that he has been smoking Pipe.  He does not have any smokeless tobacco history on file. He reports that he drinks about .6 ounces of alcohol per week. He reports that he does not use illicit drugs. family history includes Coronary artery disease in his other; Heart disease in his father and mother; Hepatitis in his sister; and Stroke in his father. No Known Allergies  Review of Systems  patient denies chest pain, shortness of breath, orthopnea. Denies lower extremity edema, abdominal pain, change in appetite, change in bowel movements. Patient denies rashes, musculoskeletal complaints. No other specific complaints in a complete review of systems.      Objective:   Physical Exam Well-developed male in no acute distress. HEENT exam atraumatic, normocephalic, extraocular muscles are intact. Conjunctivae are pink without exudate. Neck is supple without lymphadenopathy, thyromegaly, jugular venous distention. Chest is clear to auscultation without increased work of breathing. Cardiac exam S1-S2 are regular. The PMI is normal. No significant murmurs or gallops. Abdominal exam active bowel sounds, soft,  nontender. No abdominal bruits. Extremities no clubbing cyanosis or edema. Peripheral pulses are normal without bruits. Neurologic exam alert and oriented without any motor or sensory deficits. Rectal exam normal tone prostate normal size without masses or asymmetry.    Assessment & Plan:  Health maint UTD

## 2011-02-13 LAB — ALPHA-1 ANTITRYPSIN PHENOTYPE

## 2011-02-14 ENCOUNTER — Telehealth: Payer: Self-pay | Admitting: *Deleted

## 2011-02-14 NOTE — Telephone Encounter (Signed)
Pt is still SOB.  Pt states that Dr Shelle Iron told him his lungs were clear.  Pt has hx of COPD but wants to know what the next should be

## 2011-02-15 NOTE — Telephone Encounter (Signed)
Refer to pulmonary rehabilitation

## 2011-02-18 NOTE — Telephone Encounter (Signed)
Pt did not know his wife had discussed this with me.  He does not want a referral right now but he will call back if he needs the referral

## 2011-03-25 ENCOUNTER — Other Ambulatory Visit: Payer: Self-pay | Admitting: Pulmonary Disease

## 2011-03-28 ENCOUNTER — Telehealth: Payer: Self-pay | Admitting: Pulmonary Disease

## 2011-03-28 NOTE — Telephone Encounter (Signed)
I spoke with pt wife and she states [facility] advised her they have not received rx's for pt spiriva and advairr. i spoke with [facility] pharmacy and they state they did received the rx's and will be shipped out on sept 21. Pt wife aware

## 2011-04-23 ENCOUNTER — Encounter: Payer: Self-pay | Admitting: Pulmonary Disease

## 2011-04-23 ENCOUNTER — Ambulatory Visit (INDEPENDENT_AMBULATORY_CARE_PROVIDER_SITE_OTHER)
Admission: RE | Admit: 2011-04-23 | Discharge: 2011-04-23 | Disposition: A | Discharge status: Home / Self Care | Payer: 59 | Source: Ambulatory Visit | Attending: Pulmonary Disease | Admitting: Pulmonary Disease

## 2011-04-23 ENCOUNTER — Ambulatory Visit (INDEPENDENT_AMBULATORY_CARE_PROVIDER_SITE_OTHER): Payer: 59 | Admitting: Pulmonary Disease

## 2011-04-23 DIAGNOSIS — R079 Chest pain, unspecified: Secondary | ICD-10-CM

## 2011-04-23 DIAGNOSIS — J449 Chronic obstructive pulmonary disease, unspecified: Secondary | ICD-10-CM

## 2011-04-23 IMAGING — CR DG CHEST 2V
2 series · 2 of 2 positions shown · non-contrast
Comparison: [DATE]

CLINICAL DATA: Left-sided chest pain

CHEST - 2 VIEW

[view not recorded (1 of 2)]
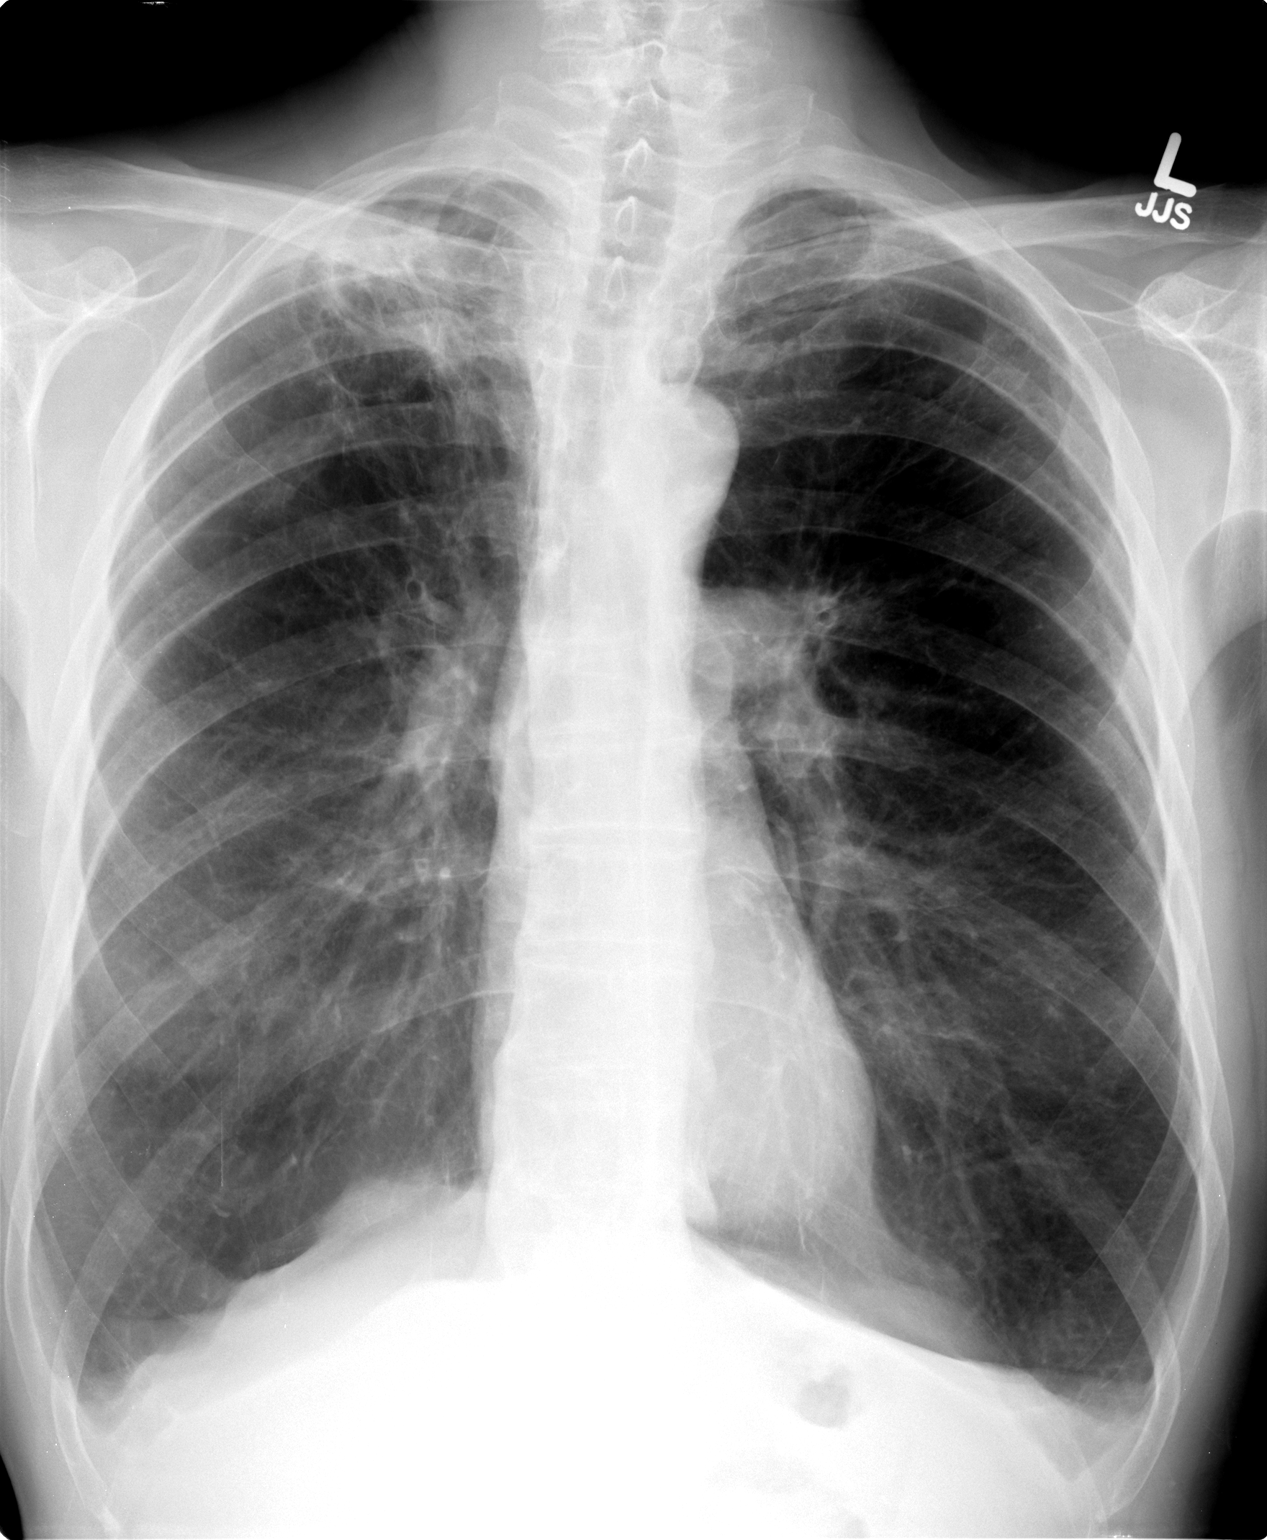

[view not recorded (2 of 2)]
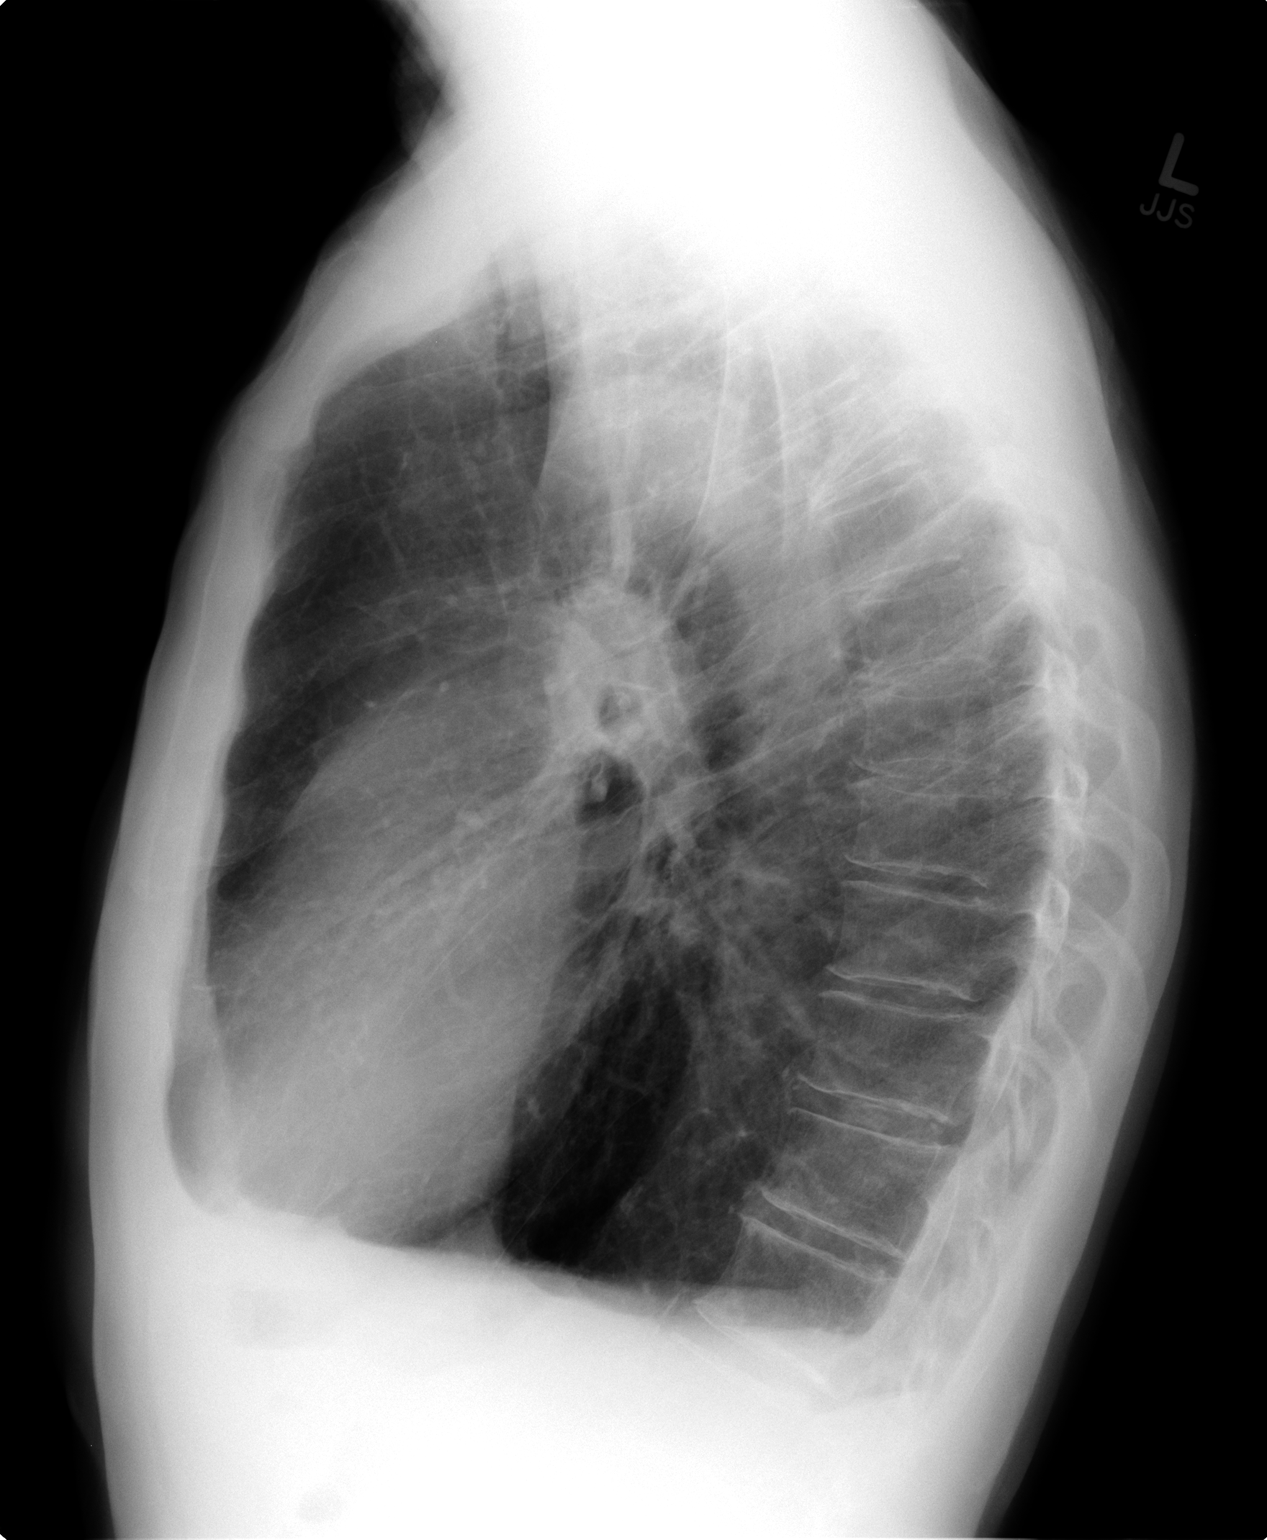

[2 of 2 positions shown; findings below may reference images not displayed]

FINDINGS: Heart size is normal.

No pleural effusion or pulmonary edema.

There are diffusely coarsened interstitial markings identified
bilaterally.

Lungs are hyperinflated.

Similar appearance of right apical opacity compared with
[DATE].  Likely scarring.

No new findings identified.

The visualized osseous structures are unremarkable.
IMPRESSION: 1.  Advanced COPD/emphysema.
2.  No acute cardiopulmonary abnormalities.
3.  Similar appearance of right apical os paucity, likely due to
scarring.  Underlying malignancy would be difficult to exclude
based upon the plain film radiographs alone.

## 2011-04-23 NOTE — Patient Instructions (Signed)
Will check cxr, and call you with results today. Please call if recurs.

## 2011-04-23 NOTE — Progress Notes (Signed)
  Subjective:    Patient ID: Francisco [name], male    DOB: 04-May-1946, 65 y.o.   MRN: [id number]  HPI The patient comes in today for an acute sick visit.  He has known COPD, but has been controlled on his bronchodilator regimen.  He notes the sudden onset of left pleuritic chest pain approximately 5 days ago, and describes it being under his left shoulder blade.  He denied any prior URI symptoms, and did not do any unusual lifting or physical exertion.  He had minimal increase in cough with white mucus, but no chest congestion or purulence.  He had no fever or sweats, but did feel chilled.  He did have increased shortness of breath, but is uncertain whether this was simply due to splinting.his pain spontaneously resolved last night, and his breathing is now back to his usual baseline.   Review of Systems  Constitutional: Positive for chills and diaphoresis. Negative for fever and unexpected weight change.  HENT: Negative for ear pain, nosebleeds, congestion, sore throat, rhinorrhea, sneezing, trouble swallowing, dental problem, postnasal drip and sinus pressure.   Eyes: Negative for redness and itching.  Respiratory: Positive for cough, shortness of breath and wheezing. Negative for chest tightness.   Cardiovascular: Negative for palpitations and leg swelling.  Gastrointestinal: Negative for nausea and vomiting.  Genitourinary: Negative for dysuria.  Musculoskeletal: Positive for back pain. Negative for joint swelling.  Skin: Negative for rash.  Neurological: Negative for headaches.  Hematological: Does not bruise/bleed easily.  Psychiatric/Behavioral: Negative for dysphoric mood. The patient is not nervous/anxious.        Objective:   Physical Exam Thin male in no acute distress Nose without purulence or discharge noted Chest with decreased breath sounds, no wheezes or rhonchi Cardiac exam with regular rate and rhythm Lower extremities without edema, no calf tenderness.  No cyanosis  noted Alert and oriented, moves all 4 extremities.       Assessment & Plan:

## 2011-04-23 NOTE — Assessment & Plan Note (Signed)
The patient has had the sudden onset of what sounds like pleuritic chest pain under his left shoulder blade.  It totally resolved last night, and now the patient feels that he is back to his usual baseline.  He denies any classic symptoms of pneumonia, and has nothing by history or exam to support the diagnosis of pulmonary embolus.  I wonder if he may have had a small pneumothorax which has since resolved.  Will go ahead and order a chest x-ray for evaluation, and call him with the results.

## 2011-04-24 ENCOUNTER — Telehealth: Payer: Self-pay | Admitting: Pulmonary Disease

## 2011-04-24 NOTE — Telephone Encounter (Signed)
Spoke with pt. He is requesting cxr results from yesterday's visit. KC, pls advise thanks!

## 2011-04-24 NOTE — Telephone Encounter (Signed)
Already sent note to nurse to address.

## 2011-04-24 NOTE — Progress Notes (Signed)
Please let pt and wife know that his cxr did not show any pulmonary edema. The pt has SEVERE lung disease, and will have issues with sob.  Pt's can lose weight just from severe emphysema,  But should have evaluation with primary md to rule out other causes of weight loss that may have nothing to do with his lungs.  Regarding seeing a cardiologist, suspect his sob is due to his lung disease and debility, but can never exclude a cardiac component as well.  Can be discussed with primary md also.

## 2011-05-01 NOTE — Telephone Encounter (Signed)
KC appended pt's recent cxr with the following documentation...... Let wife know that he has severe lung disease, and most likely this is the explanation for his shortness of breath. Cannot exclude additional heart issue, but his cxr does not show pulmonary edema. She can discuss with primary md possible cardiac testing. His severe lung disease can cause weight loss (called pulmonary cachexia), but he needs to see primary md if he is losing a lot of weight that is unexplained to see if there is anything medical going on.   I called and spoke with pt's wife and informed her of the above information.  Wife verbalized understanding and denied any questions and stated she will f/u with pt's pcp.

## 2011-08-15 ENCOUNTER — Encounter: Payer: Self-pay | Admitting: Pulmonary Disease

## 2011-08-15 ENCOUNTER — Ambulatory Visit (INDEPENDENT_AMBULATORY_CARE_PROVIDER_SITE_OTHER): Payer: 59 | Admitting: Pulmonary Disease

## 2011-08-15 VITALS — BP 132/64 | HR 84 | Temp 97.6°F | Ht 71.0 in | Wt 144.4 lb

## 2011-08-15 DIAGNOSIS — J449 Chronic obstructive pulmonary disease, unspecified: Secondary | ICD-10-CM

## 2011-08-15 DIAGNOSIS — Z23 Encounter for immunization: Secondary | ICD-10-CM

## 2011-08-15 MED ORDER — FLUTICASONE PROPIONATE 50 MCG/ACT NA SUSP: 2.0000 | Freq: Every day | NASAL | Status: DC

## 2011-08-15 NOTE — Assessment & Plan Note (Signed)
The patient has very severe obstructive lung disease, but is much more functional than his numbers would indicate.  He is on a very good bronchodilator regimen, and has not had a recent exacerbation.  Currently, I am most concerned about his loss of muscle mass and his lack of conditioning.  I have offered to refer him again to pulmonary rehabilitation, and he is at least willing to consider.

## 2011-08-15 NOTE — Progress Notes (Signed)
Addended by: Salli Quarry on: 08/15/2011 09:31 AM   Modules accepted: Orders

## 2011-08-15 NOTE — Patient Instructions (Signed)
Continue current breathing medications Will give you a pneumovax today Will have the director of pulmonary rehab call you to discuss program Push calories to maintain muscle mass.  Remember it takes muscle strength to breathe and be active. followup with me in 6mos.

## 2011-08-15 NOTE — Progress Notes (Signed)
  Subjective:    Patient ID: Francisco [name], male    DOB: Jul 27, 1945, 66 y.o.   MRN: [id number]  HPI The patient comes in today for followup of his severe COPD.  He is maintaining on a good bronchodilator regimen, and has not had a recent pulmonary infection or acute exacerbation.  He has actually gained 2 pounds since his last visit, and is trying to stay active.  He denies any significant cough or purulence.   Review of Systems  Constitutional: Negative for fever and unexpected weight change.  HENT: Positive for rhinorrhea. Negative for ear pain, nosebleeds, congestion, sore throat, sneezing, trouble swallowing, dental problem, postnasal drip and sinus pressure.   Eyes: Negative for redness and itching.  Respiratory: Positive for cough and wheezing. Negative for chest tightness and shortness of breath.   Cardiovascular: Negative for palpitations and leg swelling.  Gastrointestinal: Negative for nausea and vomiting.  Genitourinary: Negative for dysuria.  Musculoskeletal: Negative for joint swelling.  Skin: Negative for rash.  Neurological: Negative for headaches.  Hematological: Bruises/bleeds easily.  Psychiatric/Behavioral: Negative for dysphoric mood. The patient is not nervous/anxious.        Objective:   Physical Exam Thin male in no acute distress Nose without purulence or discharge noted Chest very decreased breath sounds throughout, no wheezing Cardiac exam with distant heart sounds but regular Lower extremities without edema, no cyanosis Alert and oriented, moves all 4 extremities.       Assessment & Plan:

## 2011-09-02 ENCOUNTER — Telehealth: Payer: Self-pay | Admitting: Pulmonary Disease

## 2011-09-02 NOTE — Telephone Encounter (Signed)
Pt reports that he has not heard from director of pulm  rehab program as discussed at last ov.  Please advise on what to tell pt.

## 2011-09-02 NOTE — Telephone Encounter (Signed)
Pcc, please check into this for me.  Thanks.

## 2011-09-03 NOTE — Telephone Encounter (Signed)
It takes 4-5 weeks for pt to hear from [facility]

## 2011-09-04 NOTE — Telephone Encounter (Signed)
Spoke to pt he is aware of the delay in pul rehab contacting him 4-5wks

## 2011-09-04 NOTE — Telephone Encounter (Signed)
[id number] x1 

## 2011-09-23 ENCOUNTER — Other Ambulatory Visit: Payer: Self-pay | Admitting: Pulmonary Disease

## 2011-09-23 ENCOUNTER — Ambulatory Visit (INDEPENDENT_AMBULATORY_CARE_PROVIDER_SITE_OTHER)
Admission: RE | Admit: 2011-09-23 | Discharge: 2011-09-23 | Disposition: A | Discharge status: Home / Self Care | Payer: 59 | Source: Ambulatory Visit | Attending: Pulmonary Disease | Admitting: Pulmonary Disease

## 2011-09-23 ENCOUNTER — Encounter: Payer: Self-pay | Admitting: Pulmonary Disease

## 2011-09-23 ENCOUNTER — Ambulatory Visit (INDEPENDENT_AMBULATORY_CARE_PROVIDER_SITE_OTHER): Payer: 59 | Admitting: Pulmonary Disease

## 2011-09-23 VITALS — BP 122/72 | HR 88 | Temp 98.4°F | Ht 73.0 in | Wt 140.4 lb

## 2011-09-23 DIAGNOSIS — R911 Solitary pulmonary nodule: Secondary | ICD-10-CM | POA: Insufficient documentation

## 2011-09-23 DIAGNOSIS — J441 Chronic obstructive pulmonary disease with (acute) exacerbation: Secondary | ICD-10-CM

## 2011-09-23 DIAGNOSIS — J449 Chronic obstructive pulmonary disease, unspecified: Secondary | ICD-10-CM

## 2011-09-23 IMAGING — CR DG CHEST 2V
3 series · 3 of 3 positions shown · non-contrast
Comparison: [DATE]

CLINICAL DATA: Cough, shortness of breath, chest pain, smoker,
emphysema

CHEST - 2 VIEW

[view not recorded (1 of 3)]
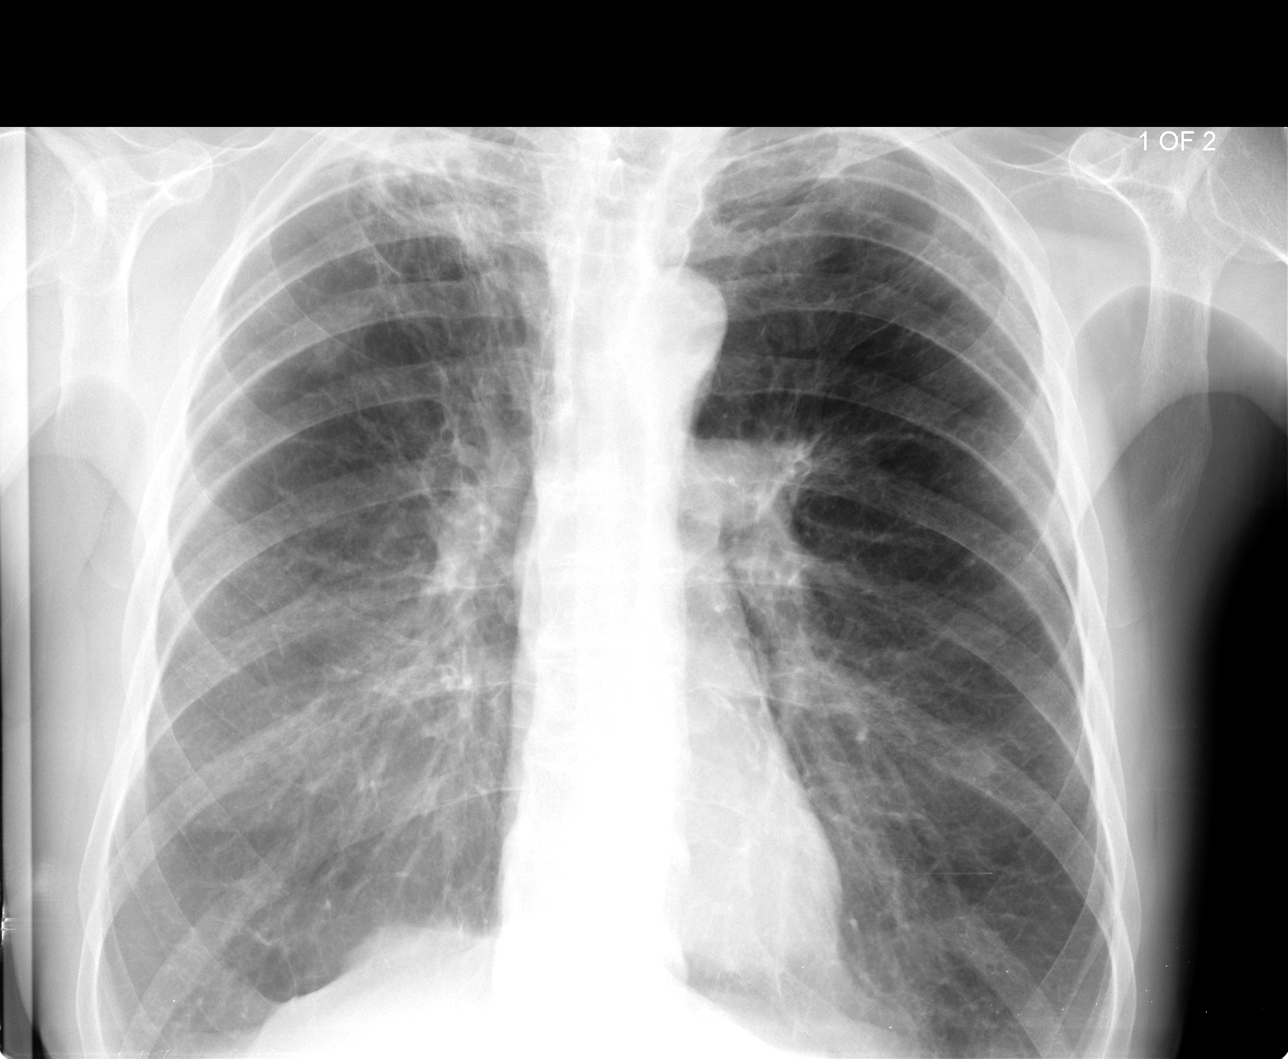

[view not recorded (2 of 3)]
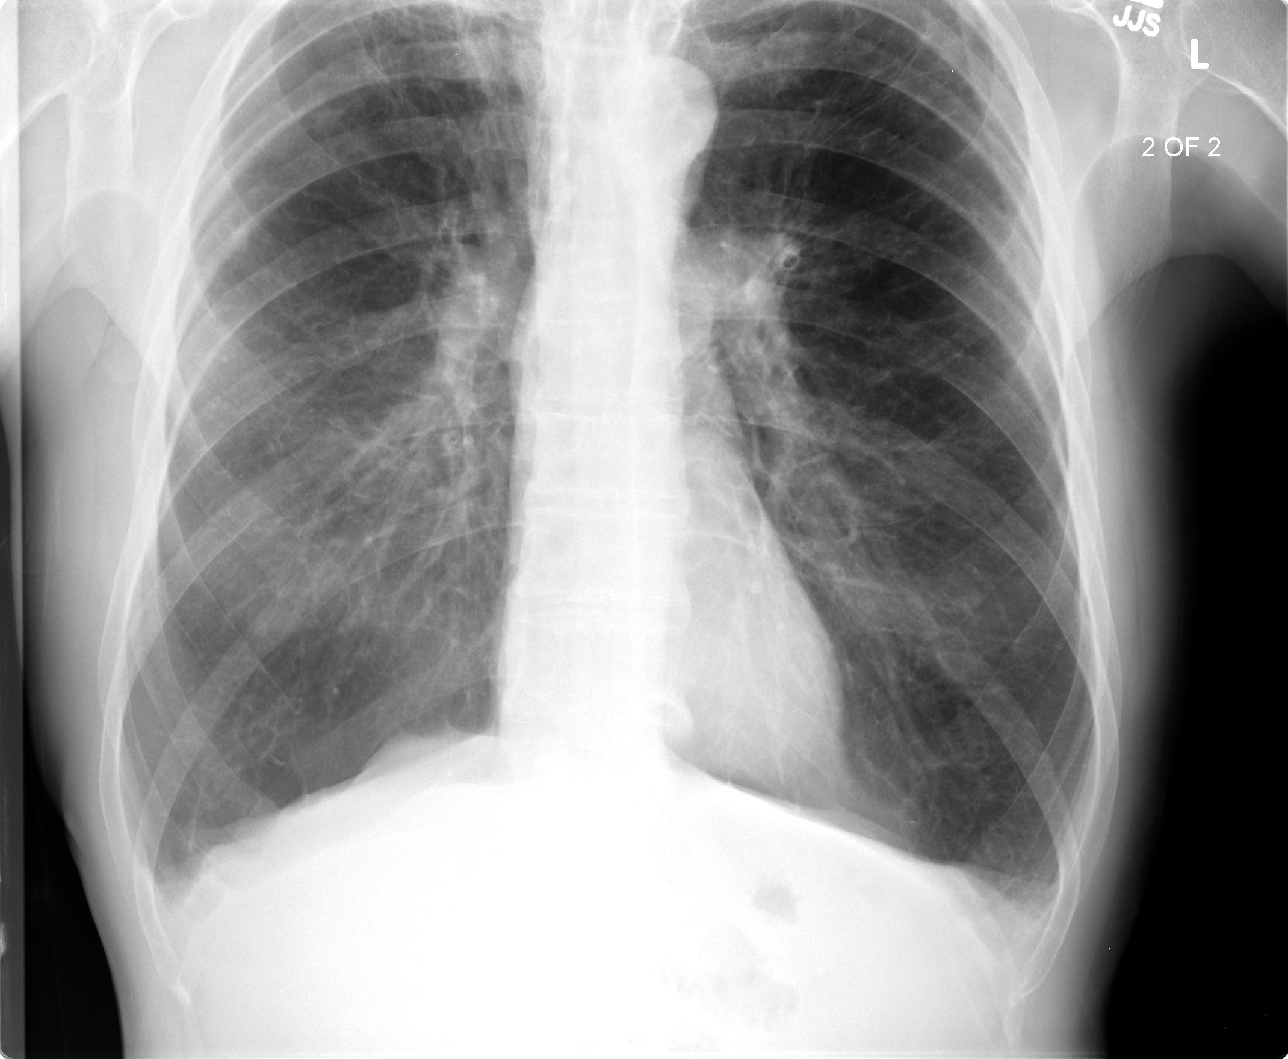

[view not recorded (3 of 3)]
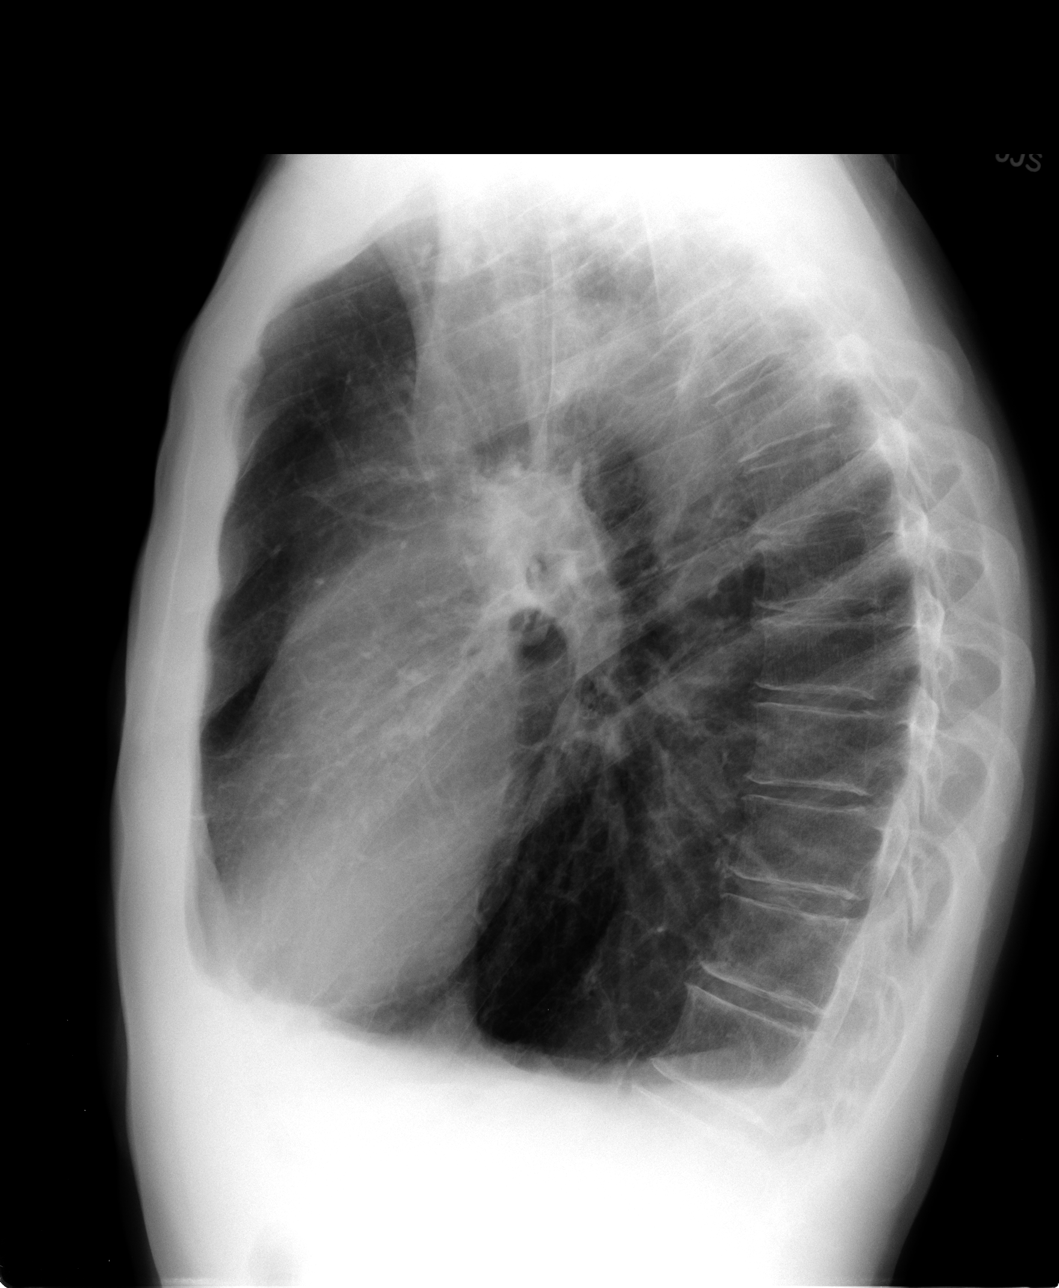

[3 of 3 positions shown; findings below may reference images not displayed]

FINDINGS: Normal heart size, mediastinal contours, and pulmonary vascularity.
Emphysematous and chronic bronchitic changes consistent with COPD.
Biapical scarring greater on the right, with appearance of the
right apex not significantly changed since earlier exam of
[DATE].
Questionable new nodular density is seen in the lateral right upper
lobe however, 8 x 6 mm, not seen on previous exams.
Remaining lungs clear.
Potential left nipple shadow.
No definite pleural effusion or pneumothorax.
End plate spur formation thoracic spine.
IMPRESSION: Severe emphysematous changes with extensive right apical scarring.
Questionable new 8 x 6 mm right upper lobe nodule; recommend CT
chest to exclude developing nodule.

## 2011-09-23 MED ORDER — HYDROCODONE-HOMATROPINE 5-1.5 MG/5ML PO SYRP: 5.0000 mL | ORAL_SOLUTION | Freq: Four times a day (QID) | ORAL | Status: DC | PRN

## 2011-09-23 MED ORDER — PREDNISONE 10 MG PO TABS: ORAL_TABLET | ORAL | Status: DC

## 2011-09-23 MED ORDER — MOXIFLOXACIN HCL 400 MG PO TABS: 400.0000 mg | ORAL_TABLET | Freq: Every day | ORAL | Status: DC

## 2011-09-23 NOTE — Assessment & Plan Note (Signed)
The patient has worsening shortness of breath as well as chest congestion with purulent mucus over the last few days.  He also has bronchospasm on exam today.  More than likely, this is acute asthmatic bronchitis with COPD exacerbation, but I cannot exclude the possibility of pneumonia.  Will treat with an antibiotic as well as a course of prednisone.  The duration of antibiotics will depend upon his chest x-ray findings.  He is to call as if he is not improving.

## 2011-09-23 NOTE — Progress Notes (Signed)
  Subjective:    Patient ID: Francisco [name], male    DOB: 09/14/1945, 66 y.o.   MRN: [id number]  HPI The patient comes in today for an acute sick visit.  He has known severe emphysema, and gives a 3 to four-day history of fever, worsening shortness of breath, chest congestion with cough productive of purulent mucus.  He has worsened over the weekend, and is concerned that he may have pneumonia.  He also has some right sided chest pain with coughing.  Positive wheezing noted   Review of Systems  Constitutional: Positive for fever. Negative for unexpected weight change.  HENT: Positive for congestion. Negative for ear pain, nosebleeds, sore throat, rhinorrhea, sneezing, trouble swallowing, dental problem, postnasal drip and sinus pressure.   Eyes: Negative for redness and itching.  Respiratory: Positive for cough, chest tightness, shortness of breath and wheezing.   Cardiovascular: Negative for palpitations and leg swelling.  Gastrointestinal: Negative for nausea and vomiting.  Genitourinary: Negative for dysuria.  Musculoskeletal: Negative for joint swelling.  Skin: Negative for rash.  Neurological: Positive for headaches.  Hematological: Does not bruise/bleed easily.  Psychiatric/Behavioral: Negative for dysphoric mood. The patient is not nervous/anxious.        Objective:   Physical Exam Thin male in no acute distress Nose without purulence or discharge noted Chest with decreased breath sounds throughout, a few basilar crackles, bilateral wheezes throughout. Cardiac exam with regular rate and rhythm Lower extremities without edema, no cyanosis noted Alert and oriented, moves all 4 extremities.       Assessment & Plan:

## 2011-09-23 NOTE — Patient Instructions (Signed)
Will treat with avelox 400mg  one a day.  Will call into pharmacy once we see your xray. Prednisone taper as prescribed. Will check cxr today and call you with results. hycodin cough syrup one teaspoon every 6 hrs if needed for cough.  Please call us if you are not improving.

## 2011-09-25 ENCOUNTER — Telehealth: Payer: Self-pay | Admitting: Pulmonary Disease

## 2011-09-25 NOTE — Telephone Encounter (Signed)
Called spoke with patient who is requesting to know if it usual for the prednisone and avelox to make him more SOB and jittery and nervous.  Began taking the avelox at ov on 3.18.13 and the prednisone yesterday.  Pt reports DOE onset yesterday and has CT scheduled for 3.22.13 @ 2:30pm and feels at this point that he is unable to have this done d/t the jittery/nervousness.  Patient is requesting to speak with [facility] personally but reported he is okay if recs come from the nurse.  Dr Shelle Iron please advise, thanks.

## 2011-09-25 NOTE — Telephone Encounter (Signed)
Prednisone can make some pts jittery.  I think it is important that he take it for his breathing.

## 2011-09-25 NOTE — Telephone Encounter (Signed)
Pt aware of KC's suggestion and I encouraged pt to stay on the prednisone as well and to keep appt for the CT--pt verbalized understanding the importance of both.

## 2011-09-26 ENCOUNTER — Telehealth: Payer: Self-pay | Admitting: Pulmonary Disease

## 2011-09-26 MED ORDER — NYSTATIN 100000 UNIT/ML MT SUSP: 400000.0000 [IU] | Freq: Three times a day (TID) | OROMUCOSAL | Status: AC

## 2011-09-26 NOTE — Telephone Encounter (Signed)
Nystatin suspension 400,000 IU tid for 5 days.  Swish and swallow

## 2011-09-26 NOTE — Telephone Encounter (Signed)
Spoke with pt and he is c/o "whole mouth full of blisters".  States that he has not been rinsing his mouth after using inhalers, and now knows to do this. I advised also would help to brush teeth and tongue with toothpaste containing baking soda. He wants something called in for thrush. KC, please advise, thanks!

## 2011-09-26 NOTE — Telephone Encounter (Signed)
Called and spoke with pt's wife.  Wife aware of KC"s response/recs and rx sent to pharmacy.

## 2011-09-27 ENCOUNTER — Telehealth: Payer: Self-pay | Admitting: Pulmonary Disease

## 2011-09-27 ENCOUNTER — Ambulatory Visit (INDEPENDENT_AMBULATORY_CARE_PROVIDER_SITE_OTHER)
Admission: RE | Admit: 2011-09-27 | Discharge: 2011-09-27 | Disposition: A | Discharge status: Home / Self Care | Payer: 59 | Source: Ambulatory Visit | Attending: Pulmonary Disease | Admitting: Pulmonary Disease

## 2011-09-27 DIAGNOSIS — R911 Solitary pulmonary nodule: Secondary | ICD-10-CM

## 2011-09-27 IMAGING — CT CT CHEST W/O CM
2 of 4 series · 15 of 36 positions shown, 18 images · IV contrast (Omnipaque 300)
Comparison: Chest radiograph dated [DATE], CT chest dated
[DATE]

CLINICAL DATA: SOB, abnormal chest radiograph, possible pulmonary
nodule

CT CHEST WITHOUT CONTRAST
TECHNIQUE: Multidetector CT imaging of the chest was performed
following the standard protocol without IV contrast.

[Series 2: chest routine with · axial · 0.71mm/px · z∈[-334,-34]mm · 12 of 72 slices shown, 15 images]
[im 6/72  mediastinal]
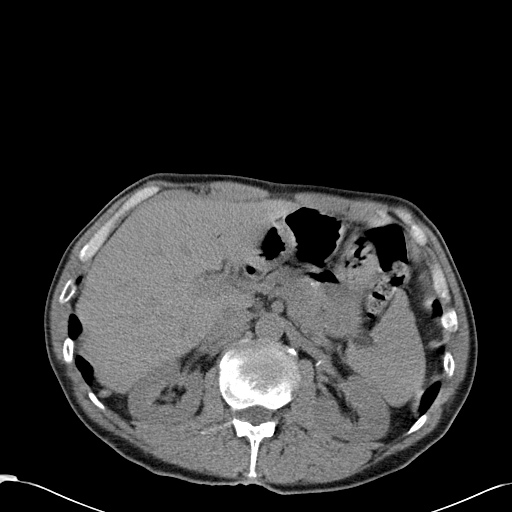
[im 6/72  lung]
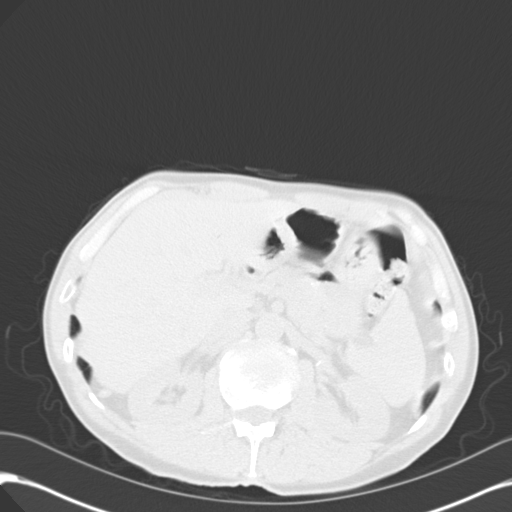
[im 11/72  lung]
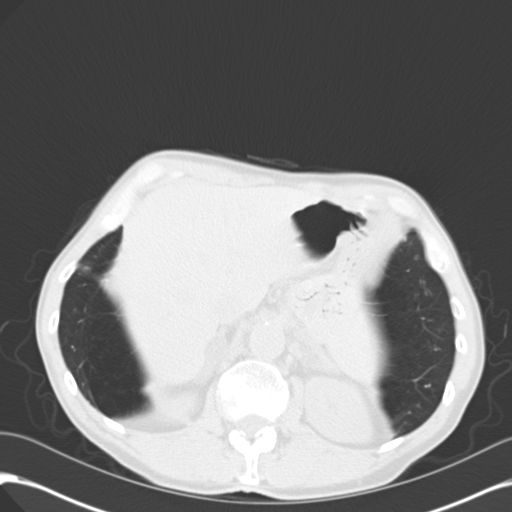
[im 17/72  lung]
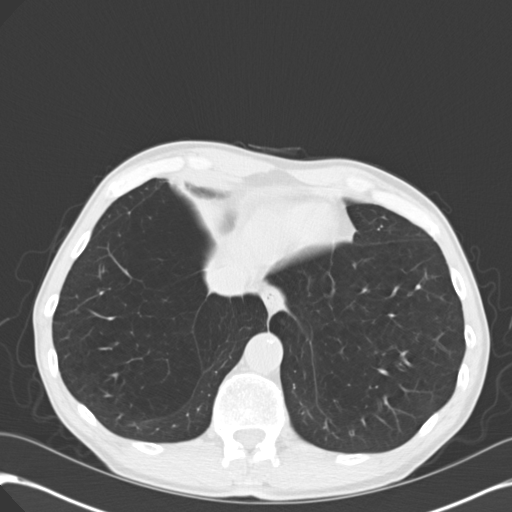
[im 22/72  lung]
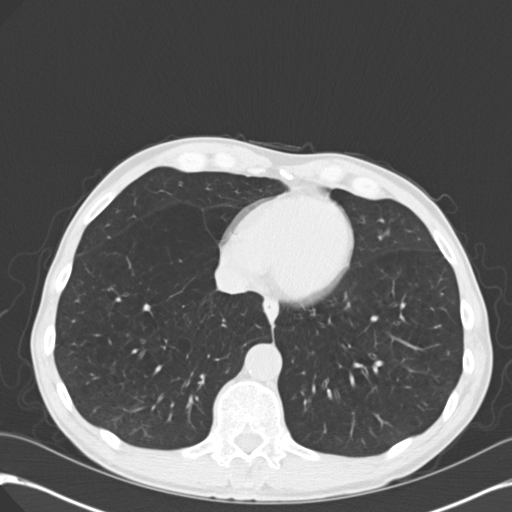
[im 28/72  mediastinal]
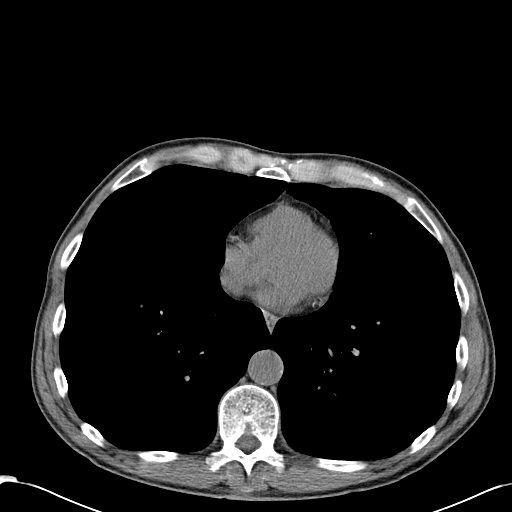
[im 28/72  lung]
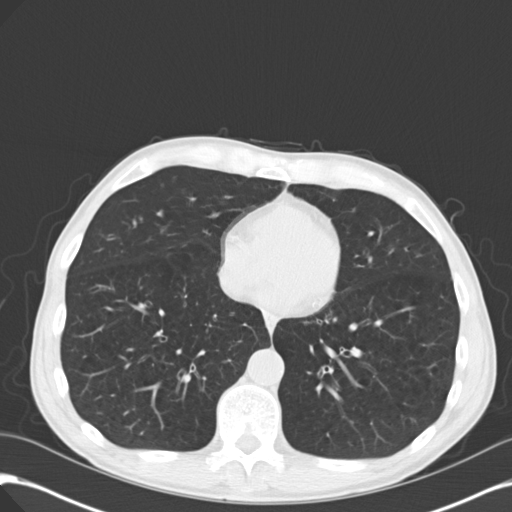
[im 33/72  lung]
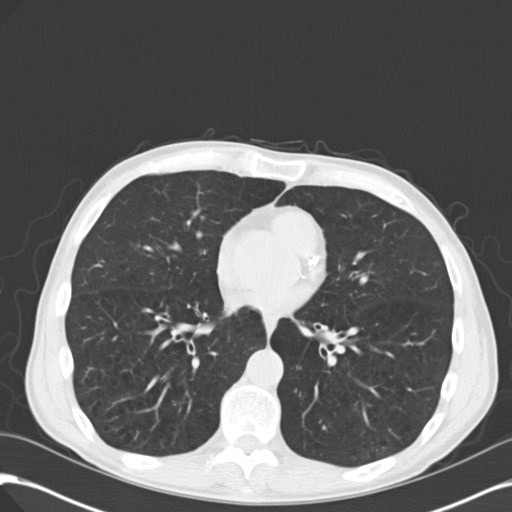
[im 39/72  lung]
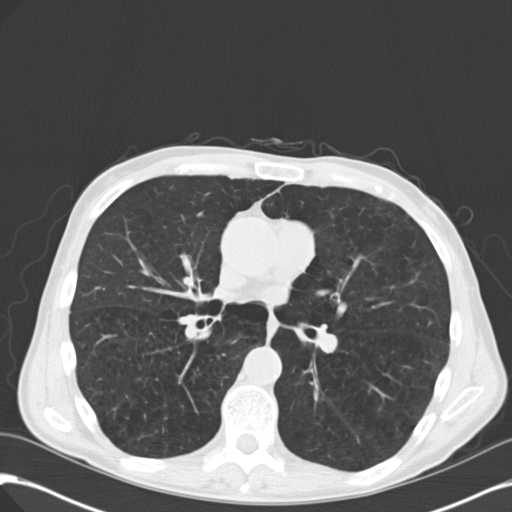
[im 44/72  lung]
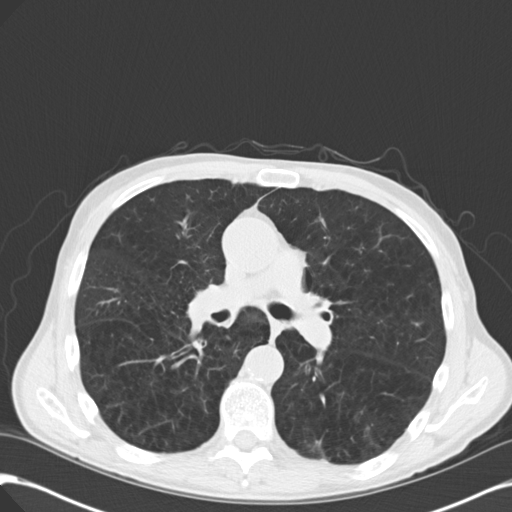
[im 50/72  mediastinal]
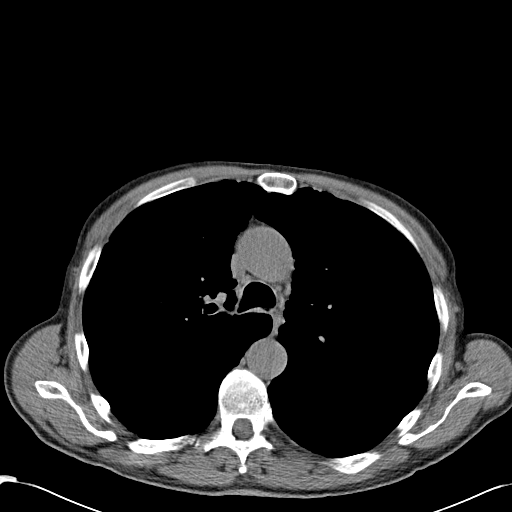
[im 50/72  lung]
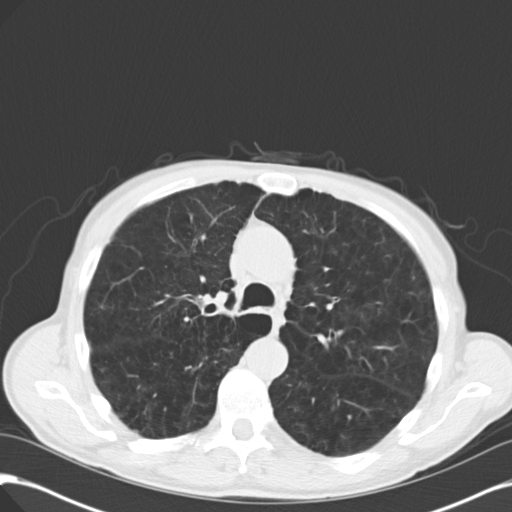
[im 55/72  lung]
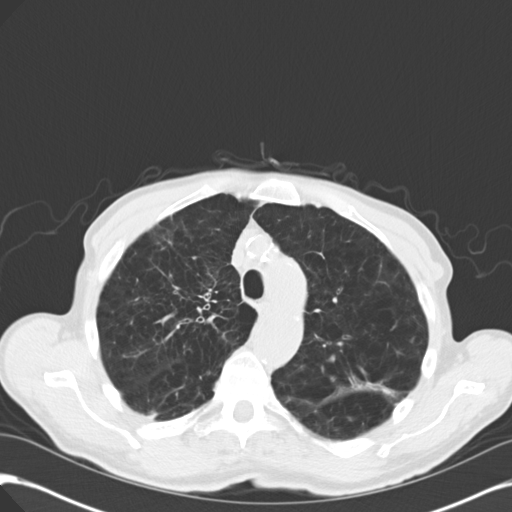
[im 61/72  lung]
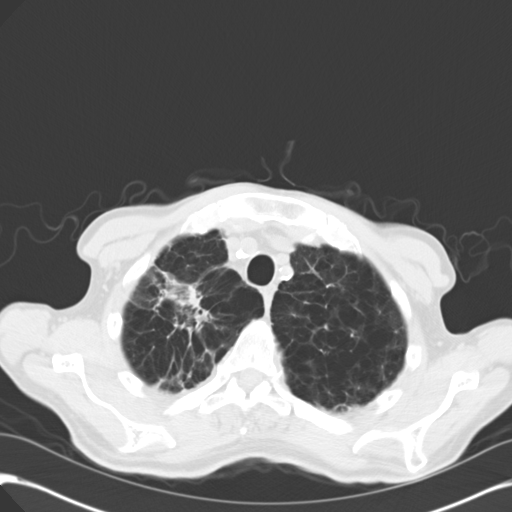
[im 66/72  lung]
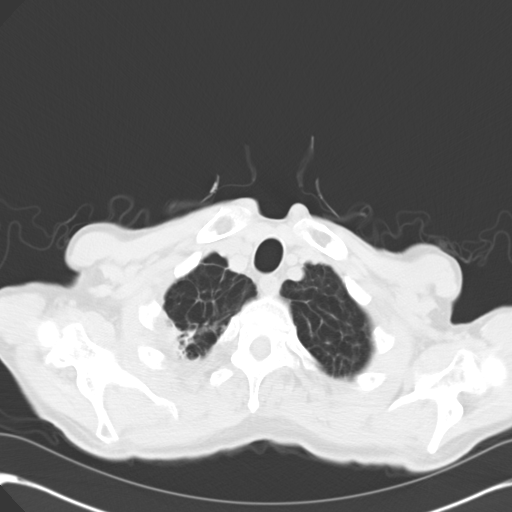

[Series 602: cor · coronal · 0.71mm/px · 3 of 112 slices shown]
[im 23/112  lung]
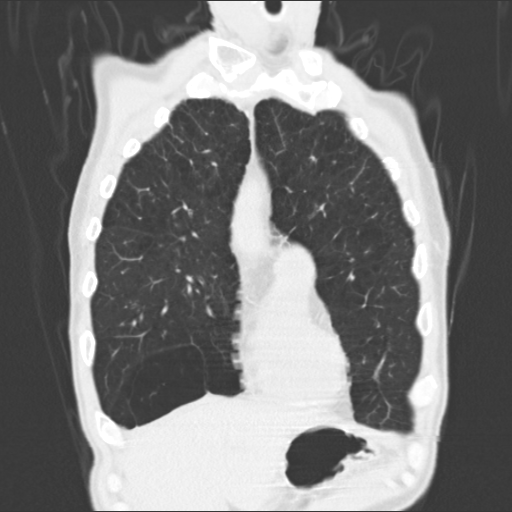
[im 45/112  lung]
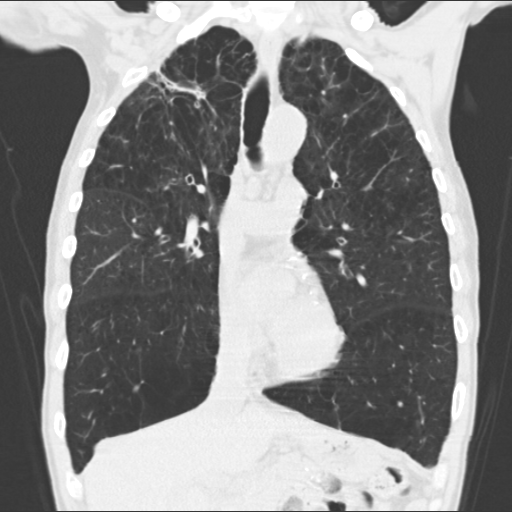
[im 67/112  lung]
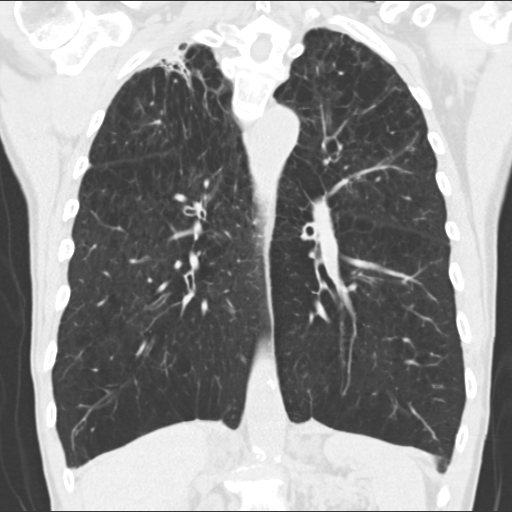

[15 of 36 positions shown; findings below may reference images not displayed]

FINDINGS: Extensive right apical scarring/fibrosis, improved from
prior CT.  Subpleural scarring/fibrosis in the posterior upper
lobes, stable on the right, new on the left.

8 x 5 mm subpleural nodule in the right upper lobe (series 3/image
26), likely corresponding to the radiographic abnormality, new.  7
x 4 mm nodule in the left lower lobe, new.  Mild subpleural
nodularity in the left lower lobe (series 3/image 26), new.

Extensive underlying centrilobular and paraseptal emphysematous
changes. No pleural effusion or pneumothorax.

Visualized thyroid is unremarkable.

The heart is normal in size.  No pericardial effusion. Coronary
atherosclerosis.  Atherosclerotic calcifications of the aortic
arch.

Small mediastinal lymph nodes which do not meet pathologic CT size
criteria.  No suspicious axillary lymphadenopathy.

Visualized upper abdomen is notable for vascular calcifications and
a 1.9 x 1.3 cm right adrenal adenoma (series 2/image 62).
IMPRESSION: 8 x 5 mm subpleural nodule in the right upper lobe, likely
corresponding to the radiographic abnormality, new.  Additional 7 x
4 mm nodule in the left lower lobe, new.  Follow-up CT chest is
suggested in 3-6 months per [HOSPITAL] guidelines.

Additional bilateral upper lobe scarring/fibrosis, as described
above.

Extensive underlying emphysematous changes.

Right adrenal adenoma.

## 2011-09-27 NOTE — Telephone Encounter (Signed)
Called and spoke with pt's wife. Informed her of CT results and KC's response and recs regarding worsening breathing.  Wife verbalized understanding and denied any questions and will seek emergency care if symptoms worsen over the weekend.

## 2011-09-27 NOTE — Telephone Encounter (Signed)
Please let pt know that his chest ct does show very small spots in the lung on right and left.  Unclear if these are anything to worry about (too small).  The size of these determine the followup should be in 3 mos in those with smoking history.  Would like to schedule for f/u at that time if ok with him.  As far as his breathing goes, there is no pna or new finding on chest scan that would explain his worsening shortness of breath.  If this is persisting next week, will need to see him back in office.  If he worsens over the w/e, will need to go to ER.

## 2011-09-27 NOTE — Telephone Encounter (Signed)
Spoke with pt's spouse. She in concerned about pt's breathing not improving. She states since last ov on 09/23/11 he has needed to use rescue 2-4 times per day with little relief. She states he had ct chest done this afternoon and he was so OOB he had to be wheeled out to his car. She states he gets OOB walking several steps and "is really struggling".  I advised sounds like may need to go to ED since having this much trouble and the w/e is here. She wants recs from [facility] first. Please advise, thanks!

## 2011-09-30 ENCOUNTER — Ambulatory Visit (INDEPENDENT_AMBULATORY_CARE_PROVIDER_SITE_OTHER): Payer: 59 | Admitting: Pulmonary Disease

## 2011-09-30 DIAGNOSIS — R0600 Dyspnea, unspecified: Secondary | ICD-10-CM

## 2011-09-30 DIAGNOSIS — R0609 Other forms of dyspnea: Secondary | ICD-10-CM

## 2011-09-30 MED ORDER — MOXIFLOXACIN HCL 400 MG PO TABS: 400.0000 mg | ORAL_TABLET | Freq: Every day | ORAL | Status: AC

## 2011-09-30 NOTE — Progress Notes (Signed)
Addended by: Salli Quarry on: 09/30/2011 10:23 AM   Modules accepted: Orders

## 2011-09-30 NOTE — Progress Notes (Signed)
Not a visit

## 2011-10-01 ENCOUNTER — Ambulatory Visit (HOSPITAL_COMMUNITY): Payer: 59

## 2011-10-01 ENCOUNTER — Other Ambulatory Visit: Payer: Self-pay

## 2011-10-01 ENCOUNTER — Ambulatory Visit (HOSPITAL_COMMUNITY): Payer: 59 | Attending: Cardiology

## 2011-10-01 DIAGNOSIS — J449 Chronic obstructive pulmonary disease, unspecified: Secondary | ICD-10-CM | POA: Insufficient documentation

## 2011-10-01 DIAGNOSIS — R0600 Dyspnea, unspecified: Secondary | ICD-10-CM

## 2011-10-01 DIAGNOSIS — R0609 Other forms of dyspnea: Secondary | ICD-10-CM | POA: Insufficient documentation

## 2011-10-01 DIAGNOSIS — J4489 Other specified chronic obstructive pulmonary disease: Secondary | ICD-10-CM | POA: Insufficient documentation

## 2011-10-01 DIAGNOSIS — F17200 Nicotine dependence, unspecified, uncomplicated: Secondary | ICD-10-CM | POA: Insufficient documentation

## 2011-10-01 DIAGNOSIS — R0602 Shortness of breath: Secondary | ICD-10-CM

## 2011-10-01 DIAGNOSIS — R0989 Other specified symptoms and signs involving the circulatory and respiratory systems: Secondary | ICD-10-CM | POA: Insufficient documentation

## 2011-10-07 ENCOUNTER — Telehealth: Payer: Self-pay | Admitting: Pulmonary Disease

## 2011-10-07 MED ORDER — PREDNISONE 10 MG PO TABS: ORAL_TABLET | ORAL | Status: DC

## 2011-10-07 NOTE — Telephone Encounter (Signed)
This has already been addressed or was supposed to be addressed!!  See note under echo under "procedures".  This was supposed to be done last week!  Did someone not get the message?  See if in Megan's box and not addressed and let me know the outcome.  Thanks.

## 2011-10-07 NOTE — Progress Notes (Signed)
Quick Note:  Spoke with pt's and notified of results per [name]. Pt verbalized understanding and denied any questions.  ______

## 2011-10-07 NOTE — Telephone Encounter (Signed)
I spoke with spouse and she is requesting pt's echo results done on 10/01/11. Also she states she wanted to let [facility] know that pt is still "dragging" and is very tired all the time. She states he is resting a lot. Please advise KC, thanks

## 2011-10-07 NOTE — Telephone Encounter (Signed)
Notes Recorded by Barbaraann Share, MD on 10/03/2011 at 1:33 PM Let pt know that echo shows heart to have adequate squeeze. No significant elevation of pressures in the blood vessels in the lungs. This does not exclude the possibility of blockages though, and would consider cardiac evaluation if his breathing continues to be poor. Will assume for now this is due to his severe emphysema, and would like to try treating him with prednisone for another 2-3 weeks to see if things improve. Would like to see him back in 3 weeks in office.   Prednisone 10mg : 4 each day for 3 days, then 3 each day for 3 days, then 2 each day for 3 days, the one each day for 3 days, then stop.      Spoke with pt's spouse Jonna Clark and notified of the above results per [facility]. She verbalized understanding and denied any questions, states will inform the pt. Pred taper sent to pharm. Pt scheduled for rov with [facility] 10/28/11 at 9:45 am.

## 2011-10-11 ENCOUNTER — Other Ambulatory Visit (HOSPITAL_COMMUNITY): Payer: 59

## 2011-10-22 ENCOUNTER — Encounter: Payer: Self-pay | Admitting: Pulmonary Disease

## 2011-10-22 ENCOUNTER — Ambulatory Visit (INDEPENDENT_AMBULATORY_CARE_PROVIDER_SITE_OTHER): Payer: 59 | Admitting: Pulmonary Disease

## 2011-10-22 VITALS — BP 104/68 | HR 95 | Temp 98.0°F | Ht 73.0 in | Wt 132.8 lb

## 2011-10-22 DIAGNOSIS — R0989 Other specified symptoms and signs involving the circulatory and respiratory systems: Secondary | ICD-10-CM

## 2011-10-22 DIAGNOSIS — R911 Solitary pulmonary nodule: Secondary | ICD-10-CM

## 2011-10-22 DIAGNOSIS — R0609 Other forms of dyspnea: Secondary | ICD-10-CM

## 2011-10-22 DIAGNOSIS — R0602 Shortness of breath: Secondary | ICD-10-CM | POA: Insufficient documentation

## 2011-10-22 DIAGNOSIS — J449 Chronic obstructive pulmonary disease, unspecified: Secondary | ICD-10-CM

## 2011-10-22 DIAGNOSIS — R0600 Dyspnea, unspecified: Secondary | ICD-10-CM

## 2011-10-22 NOTE — Assessment & Plan Note (Signed)
The patient has a history of severe airflow obstruction, and is now having worsening dyspnea on exertion that did not respond to antibiotics and a two-week course of prednisone.  I would find it very unlikely this is related to his COPD.

## 2011-10-22 NOTE — Patient Instructions (Addendum)
Will send a note to Dr. Cato Mulligan today recommending a stress test.   No change in current medications.  Please call me if you have worsening shortness of breath in the interim.

## 2011-10-22 NOTE — Progress Notes (Signed)
  Subjective:    Patient ID: Francisco [name], male    DOB: 1945/10/08, 66 y.o.   MRN: [id number]  HPI The patient comes in today for an acute sick visit.  He has continued to have significant dyspnea on exertion above his usual baseline, despite being treated with antibiotics and a two-week course of prednisone.  He is maintaining on an aggressive bronchodilator regimen.  He also notes a persistent cough with some mucus production that is nonpurulent, but he feels there is more "stuck in there".  He can also hear a rattle at times in his chest.   Review of Systems  Constitutional: Negative.  Negative for fever and unexpected weight change.  HENT: Negative for ear pain, nosebleeds, congestion, sore throat, rhinorrhea, sneezing, trouble swallowing, dental problem, postnasal drip and sinus pressure.   Eyes: Negative.  Negative for redness and itching.  Respiratory: Positive for cough and shortness of breath. Negative for chest tightness and wheezing.   Cardiovascular: Negative.  Negative for palpitations and leg swelling.  Gastrointestinal: Negative.  Negative for nausea and vomiting.  Genitourinary: Negative.  Negative for dysuria.  Musculoskeletal: Negative.  Negative for joint swelling.  Skin: Negative.  Negative for rash.  Neurological: Positive for headaches.  Hematological: Negative.  Does not bruise/bleed easily.  Psychiatric/Behavioral: Negative.  Negative for dysphoric mood. The patient is not nervous/anxious.        Objective:   Physical Exam Thin male in no acute distress Nose without purulence or discharge noted Chest with decreased breath sounds, no wheezes or rhonchi.  He has periods significant upper airway pseudo-wheezing noted with transmission to his lower lung fields. Cardiac exam with regular rate and rhythm Lower extremities without significant edema, no cyanosis Alert and oriented, moves all 4 extremities.       Assessment & Plan:

## 2011-10-22 NOTE — Assessment & Plan Note (Signed)
The patient continues to have significant dyspnea on exertion above his usual baseline despite 2 weeks of prednisone in treatment with antibiotics.  His CT chest shows no acute process or infiltrate, and he does not desaturate with ambulation in the office briskly.  He does have some cough and feels "congestion", but I am unsure if this is really mucus in his chest or a sensation of chest fullness.  He does have a history of sinusitis, but denies any nasal symptoms currently.  I really do not think this is due to his COPD, unless he has now fallen below a critical FEV1 that is required to maintain his daily routine.  However, even this would see improvement with 2 weeks of prednisone.  He is had an echocardiogram that showed normal LV function and no evidence for pulmonary hypertension.  However, we have not excluded the possibility of coronary disease.  He did have a stress test in 2010 that was unremarkable.  At this point, I really do not have an explanation for his worsening shortness of breath.  I do think he needs to have some type of cardiac evaluation, possibly a repeat stress test or cardiac catheterization.  I discussed this with the patient, and he has asked that I passed this information on to his primary care physician.  He is frustrated about the whole situation, as I am, and I have offered to refer him for a second opinion to one of the tertiary care centers if we are not able to offer an explanation.

## 2011-10-22 NOTE — Assessment & Plan Note (Signed)
The patient will need a followup noncontrasted scan in 3 months to followup his nodules given his smoking history.

## 2011-10-23 ENCOUNTER — Other Ambulatory Visit: Payer: Self-pay | Admitting: Family

## 2011-10-23 ENCOUNTER — Telehealth: Payer: Self-pay | Admitting: Internal Medicine

## 2011-10-23 ENCOUNTER — Encounter: Payer: Self-pay | Admitting: Family

## 2011-10-23 ENCOUNTER — Ambulatory Visit (INDEPENDENT_AMBULATORY_CARE_PROVIDER_SITE_OTHER): Payer: 59 | Admitting: Family

## 2011-10-23 VITALS — BP 98/60 | HR 116 | Temp 98.8°F | Wt 131.0 lb

## 2011-10-23 DIAGNOSIS — J449 Chronic obstructive pulmonary disease, unspecified: Secondary | ICD-10-CM

## 2011-10-23 DIAGNOSIS — R634 Abnormal weight loss: Secondary | ICD-10-CM

## 2011-10-23 DIAGNOSIS — R0602 Shortness of breath: Secondary | ICD-10-CM

## 2011-10-23 LAB — CBC WITH DIFFERENTIAL/PLATELET
Eosinophils Relative: 0.2 % (ref 0.0–5.0)
HCT: 42.2 % (ref 39.0–52.0)
Hemoglobin: 14.1 g/dL (ref 13.0–17.0)
Lymphs Abs: 0.9 10*3/uL (ref 0.7–4.0)
MCV: 97.9 fl (ref 78.0–100.0)
Monocytes Absolute: 1.7 10*3/uL — ABNORMAL HIGH (ref 0.1–1.0)
Monocytes Relative: 11.4 % (ref 3.0–12.0)
Neutro Abs: 12.1 10*3/uL — ABNORMAL HIGH (ref 1.4–7.7)
Platelets: 185 10*3/uL (ref 150.0–400.0)
RDW: 13.5 % (ref 11.5–14.6)

## 2011-10-23 LAB — BASIC METABOLIC PANEL
BUN: 11 mg/dL (ref 6–23)
Calcium: 9.1 mg/dL (ref 8.4–10.5)
Chloride: 101 mEq/L (ref 96–112)
Creatinine, Ser: 0.9 mg/dL (ref 0.4–1.5)

## 2011-10-23 LAB — TSH: TSH: 1.74 u[IU]/mL (ref 0.35–5.50)

## 2011-10-23 LAB — BRAIN NATRIURETIC PEPTIDE: Pro B Natriuretic peptide (BNP): 61 pg/mL (ref 0.0–100.0)

## 2011-10-23 MED ORDER — MOXIFLOXACIN HCL 400 MG PO TABS: 400.0000 mg | ORAL_TABLET | Freq: Every day | ORAL | Status: DC

## 2011-10-23 NOTE — Telephone Encounter (Signed)
Pts wife called and said that script for Avelox has not been called in to [facility] on [address] and [address]. Pt is at the pharmacy waiting. Pls call pts wife.

## 2011-10-23 NOTE — Telephone Encounter (Signed)
Spoke with pharmacy and they did receive Rx. Pt's wife aware

## 2011-10-23 NOTE — Progress Notes (Signed)
Subjective:    Patient ID: Francisco [name], male    DOB: 10-12-1945, 66 y.o.   MRN: [id number]  HPI Comments: 66 yo white male presents stating saw pulmonology and they wanted me to follow up here and with cardiology. C/o continued wheezing and shortness of breath with exertion. Has severe emphysema and completed prednisone Sat that was prescribed by Md Clance. Does not feel better after completing prednisone taper. Denies cp, nausea, vomiting, abdominal discomfort, diaphoresis, bloody sputum,  recent weight gain or swelling in lower extremities. Shortness of breath does not get worse with lying down although states wheezing in lungs worse with lying flat. C/o chronic cough, expectorating yellow-tinged sputum. Last EKG 2010. Recent Echo EF 60-65%. Spiriva and albuterol inhaler is ineffective for dyspnea. Stated lost 10 lbs over the last month.Smokes pipe several times each day since he was age 66. C/o low back discomfort 1/10, described as throbbing/aching. Nothing makes the pain better and getting up in the am after sleeping the pain is 8/10 and worse when first getting up out of bed. Has chest xray last month.   Cough Associated symptoms include shortness of breath and wheezing. Pertinent negatives include no chills or fever.      Review of Systems  Constitutional: Positive for activity change, fatigue and unexpected weight change. Negative for fever, chills and diaphoresis.       Stating lost 10 lbs over the last month  HENT: Negative.   Eyes: Negative.   Respiratory: Positive for cough, shortness of breath and wheezing. Negative for apnea, choking, chest tightness and stridor.   Cardiovascular: Negative.   Gastrointestinal: Negative.   Skin: Negative.   Neurological: Negative.   Hematological: Negative.   Psychiatric/Behavioral: Negative.    Past Medical History  Diagnosis Date  . Onychomycosis     Dr. Melvyn Novas  . Myocardial infarction 2004    hx---stress cardiolyte  . Chest x-ray  abnormality   . Weight loss   . Shortness of breath dyspnea   . Tobacco user   . COPD (chronic obstructive pulmonary disease)     PFT's rec Jul 17, 2009  . Old myocardial infarction   . Chronic rhinitis     -Sinus Ct 08/01/2009 >> Bilateral maxillary sinusitis with some mucosal thickeningin the sphenoid and frontal sinuses as well with air fluid levels present -chronic rhinitis flyer Aug 04, 2009    History   Social History  . Marital Status: Married    Spouse Name: N/A    Number of Children: N/A  . Years of Education: N/A   Occupational History  . Not on file.   Social History Main Topics  . Smoking status: Current Some Day Smoker -- 2.0 packs/day for 10 years    Types: Pipe    Last Attempt to Quit: 07/09/1975  . Smokeless tobacco: Not on file   Comment: Started smoking cigs at age 33. Smoked 2ppd x 10 years. Now, pt states he smokes a pipe "all day long" x 25 years.   . Alcohol Use: 0.6 oz/week    1 Cans of beer per week  . Drug Use: No  . Sexually Active: Not on file   Other Topics Concern  . Not on file   Social History Narrative   Married with children. Pt works as a Veterinary surgeon for [employer]    Past Surgical History  Procedure Date  . Appendectomy   . Tonsillectomy     Family History  Problem Relation Age of Onset  .  Heart disease Mother   . Stroke Father   . Heart disease Father   . Hepatitis Sister   . Coronary artery disease Other     male 1st degree relative <60    No Known Allergies  Current Outpatient Prescriptions on File Prior to Visit  Medication Sig Dispense Refill  . ADVAIR DISKUS 250-50 MCG/DOSE AEPB USE ONE INHALATION TWO TIMES A DAY (OVERDUE FOR A FOLLOW UP APPOINTMENT)  180 each  3  . albuterol (VENTOLIN HFA) 108 (90 BASE) MCG/ACT inhaler Inhale 2 puffs into the lungs every 6 (six) hours as needed for wheezing.  1 Inhaler  5  . fluticasone (FLONASE) 50 MCG/ACT nasal spray Place 2 sprays into the nose daily.  16 g  11  .  HYDROcodone-homatropine (HYDROMET) 5-1.5 MG/5ML syrup Take 5 mLs by mouth every 6 (six) hours as needed for cough.  180 mL  0  . SPIRIVA HANDIHALER 18 MCG inhalation capsule INHALE THE CONTENTS OF 1 CAPSULE DAILY (OVERDUE FOR A FOLLOW UP APPOINTMENT)  90 capsule  3  . terbinafine (LAMISIL) 250 MG tablet Take 250 mg by mouth every 30 (thirty) days.          BP 98/60  Pulse 116  Temp(Src) 98.8 F (37.1 C) (Oral)  Wt 131 lb (59.421 kg)  SpO2 93%chart    Objective:   Physical Exam  Constitutional: He is oriented to person, place, and time. He appears well-developed and well-nourished. No distress.  HENT:  Right Ear: External ear normal.  Left Ear: External ear normal.  Nose: Nose normal.  Eyes: Right eye exhibits no discharge.  Neck: Normal range of motion. Neck supple. No JVD present. No tracheal deviation present. No thyromegaly present.  Cardiovascular: Normal rate, regular rhythm, normal heart sounds and intact distal pulses.  Exam reveals no gallop and no friction rub.   No murmur heard. Pulmonary/Chest: Effort normal and breath sounds normal. No stridor. No respiratory distress. He has no wheezes. He has no rales. He exhibits no tenderness.  Musculoskeletal: Normal range of motion. He exhibits no edema and no tenderness.  Lymphadenopathy:    He has no cervical adenopathy.  Neurological: He is alert and oriented to person, place, and time.  Skin: Skin is warm and dry. He is not diaphoretic.          Assessment & Plan:  Assessment: Severe emphysema, weight loss Plan: Cardiology consult, Labs: CBC, BMP, BNP, TSH. EKG  teaching handouts provided on diagnosis and treatment, smoking cessation encouraged and treatment options discussed, Encouraged to report to ED immediately if has cp, shortness of breath unrelieved with rest. Opportunity for questions provided.

## 2011-10-28 ENCOUNTER — Telehealth: Payer: Self-pay | Admitting: Internal Medicine

## 2011-10-28 ENCOUNTER — Emergency Department (HOSPITAL_COMMUNITY)
Admission: EM | Admit: 2011-10-28 | Discharge: 2011-10-28 | Disposition: A | Discharge status: Home / Self Care | Payer: 59 | Attending: Emergency Medicine | Admitting: Emergency Medicine

## 2011-10-28 ENCOUNTER — Encounter (HOSPITAL_COMMUNITY): Payer: Self-pay

## 2011-10-28 ENCOUNTER — Ambulatory Visit: Payer: 59 | Admitting: Pulmonary Disease

## 2011-10-28 DIAGNOSIS — J310 Chronic rhinitis: Secondary | ICD-10-CM | POA: Insufficient documentation

## 2011-10-28 DIAGNOSIS — J449 Chronic obstructive pulmonary disease, unspecified: Secondary | ICD-10-CM | POA: Insufficient documentation

## 2011-10-28 DIAGNOSIS — I252 Old myocardial infarction: Secondary | ICD-10-CM | POA: Insufficient documentation

## 2011-10-28 DIAGNOSIS — F17200 Nicotine dependence, unspecified, uncomplicated: Secondary | ICD-10-CM | POA: Insufficient documentation

## 2011-10-28 DIAGNOSIS — K0510 Chronic gingivitis, plaque induced: Secondary | ICD-10-CM | POA: Insufficient documentation

## 2011-10-28 DIAGNOSIS — X58XXXA Exposure to other specified factors, initial encounter: Secondary | ICD-10-CM | POA: Insufficient documentation

## 2011-10-28 DIAGNOSIS — J4489 Other specified chronic obstructive pulmonary disease: Secondary | ICD-10-CM | POA: Insufficient documentation

## 2011-10-28 DIAGNOSIS — T783XXA Angioneurotic edema, initial encounter: Secondary | ICD-10-CM

## 2011-10-28 LAB — POCT I-STAT, CHEM 8
Calcium, Ion: 1.18 mmol/L (ref 1.12–1.32)
Chloride: 102 mEq/L (ref 96–112)
Glucose, Bld: 73 mg/dL (ref 70–99)
HCT: 37 % — ABNORMAL LOW (ref 39.0–52.0)
TCO2: 28 mmol/L (ref 0–100)

## 2011-10-28 MED ORDER — DIPHENHYDRAMINE HCL 50 MG/ML IJ SOLN
25.0000 mg | Freq: Once | INTRAMUSCULAR | Status: AC
  Administered 2011-10-28: 25 mg via INTRAVENOUS
  Filled 2011-10-28: qty 1

## 2011-10-28 MED ORDER — METHYLPREDNISOLONE SODIUM SUCC 125 MG IJ SOLR
125.0000 mg | Freq: Once | INTRAMUSCULAR | Status: AC
  Administered 2011-10-28: 125 mg via INTRAVENOUS
  Filled 2011-10-28: qty 2

## 2011-10-28 MED ORDER — FAMOTIDINE IN NACL 20-0.9 MG/50ML-% IV SOLN
20.0000 mg | Freq: Once | INTRAVENOUS | Status: AC
Start: 2011-10-28 — End: 2011-10-28
  Administered 2011-10-28: 20 mg via INTRAVENOUS
  Filled 2011-10-28: qty 50

## 2011-10-28 MED ORDER — DIPHENHYDRAMINE HCL 25 MG PO TABS: 25.0000 mg | ORAL_TABLET | Freq: Four times a day (QID) | ORAL | Status: DC

## 2011-10-28 MED ORDER — PREDNISONE (PAK) 10 MG PO TABS: ORAL_TABLET | ORAL | Status: DC

## 2011-10-28 NOTE — Discharge Instructions (Signed)
Angioedema Angioedema (AE) is sudden puffiness (swelling) of the skin. It can happen to the face, genitals, and other body parts. You may be reacting to something you are sensitive to (allergic reaction). It may have been passed to you from your parents (hereditary), or it may develop by itself (acquired). You may also get red itchy patches of skin (hives). Attacks may be mild. Some attacks are life-threatening. Most often, the puffiness happens fast. It often gets better in 24 to 48 hours.  HOME CARE  Carry your emergency allergy medicines with you.   Wear a medical bracelet.   Avoid things that you know will cause this reaction (triggers).  GET HELP RIGHT AWAY IF:   You have trouble breathing.   You have trouble swallowing.   You pass out (faint).   You have another attack.   Your attacks happen more often or get worse.   AE was passed to you by your parents and you want to start having children.  MAKE SURE YOU:   Understand these instructions.   Will watch your condition.   Will get help right away if you are not doing well or get worse.  Document Released: 06/12/2009 Document Revised: 06/13/2011 Document Reviewed: 06/12/2009 [employer] Patient Information 2012 [employer]. 

## 2011-10-28 NOTE — ED Provider Notes (Signed)
History     CSN: [id number]  Arrival date & time 10/28/11  [id number]   First MD Initiated Contact with Patient 10/28/11 [phone]      Chief Complaint  Patient presents with  . Oral Swelling     HPI The patient presents to the emergency room with complaints of lip swelling. Patient states his symptoms started somewhat yesterday. He noticed swelling on the right side of his mouth associated with a dry feeling inside his mouth. He has not had any trouble swallowing or breathing. He was started on Avelox 4 days ago but he has been on that in the past. Otherwise, he has not been on any new medications. This morning when he woke up he noticed increased swelling and was primarily on the left side now although the right side does not feel like it has resolved it. He has not noticed any swelling elsewhere or any rash. He has no pain in his teeth or throat. He denies having symptoms similar to this in the past. His wife had given him a Benadryl this morning but has not helped that much. Past Medical History  Diagnosis Date  . Onychomycosis     Dr. Melvyn Novas  . Myocardial infarction 2004    hx---stress cardiolyte  . Chest x-ray abnormality   . Weight loss   . Shortness of breath dyspnea   . Tobacco user   . COPD (chronic obstructive pulmonary disease)     PFT's rec Jul 17, 2009  . Old myocardial infarction   . Chronic rhinitis     -Sinus Ct 08/01/2009 >> Bilateral maxillary sinusitis with some mucosal thickeningin the sphenoid and frontal sinuses as well with air fluid levels present -chronic rhinitis flyer Aug 04, 2009    Past Surgical History  Procedure Date  . Appendectomy   . Tonsillectomy     Family History  Problem Relation Age of Onset  . Heart disease Mother   . Stroke Father   . Heart disease Father   . Hepatitis Sister   . Coronary artery disease Other     male 1st degree relative <60    History  Substance Use Topics  . Smoking status: Current Some Day Smoker -- 2.0 packs/day  for 10 years    Types: Pipe    Last Attempt to Quit: 07/09/1975  . Smokeless tobacco: Not on file   Comment: Started smoking cigs at age 56. Smoked 2ppd x 10 years. Now, pt states he smokes a pipe "all day long" x 25 years.   . Alcohol Use: 0.6 oz/week    1 Cans of beer per week      Review of Systems  All other systems reviewed and are negative.    Allergies  Review of patient's allergies indicates no known allergies.  Home Medications   Current Outpatient Rx  Name Route Sig Dispense Refill  . ADVAIR DISKUS 250-50 MCG/DOSE IN AEPB  USE ONE INHALATION TWO TIMES A DAY (OVERDUE FOR A FOLLOW UP APPOINTMENT) 180 each 3  . ALBUTEROL SULFATE HFA 108 (90 BASE) MCG/ACT IN AERS Inhalation Inhale 2 puffs into the lungs every 6 (six) hours as needed for wheezing. 1 Inhaler 5  . FLUTICASONE PROPIONATE 50 MCG/ACT NA SUSP Nasal Place 2 sprays into the nose daily. 16 g 11  . HYDROCODONE-HOMATROPINE 5-1.5 MG/5ML PO SYRP Oral Take 5 mLs by mouth every 6 (six) hours as needed for cough. 180 mL 0  . MOXIFLOXACIN HCL 400 MG PO TABS Oral Take 1  tablet (400 mg total) by mouth daily. 7 tablet 0  . SPIRIVA HANDIHALER 18 MCG IN CAPS  INHALE THE CONTENTS OF 1 CAPSULE DAILY (OVERDUE FOR A FOLLOW UP APPOINTMENT) 90 capsule 3  . TERBINAFINE HCL 250 MG PO TABS Oral Take 250 mg by mouth every 30 (thirty) days.        BP 99/73  Pulse 85  Temp(Src) 97.6 F (36.4 C) (Oral)  Resp 18  SpO2 100%  Physical Exam  Nursing note and vitals reviewed. Constitutional: He appears well-developed and well-nourished. No distress.  HENT:  Head: Normocephalic and atraumatic. No trismus in the jaw.  Right Ear: External ear normal.  Left Ear: External ear normal.  Mouth/Throat: Mucous membranes are normal. No uvula swelling or lacerations. No oropharyngeal exudate, posterior oropharyngeal edema, posterior oropharyngeal erythema or tonsillar abscesses.       Mild gingivitis, no drainage, no gingival edema, assymetric edema  of upper lip, no tongue swelling, nl voice  Eyes: Conjunctivae are normal. Right eye exhibits no discharge. Left eye exhibits no discharge. No scleral icterus.  Neck: Neck supple. No tracheal deviation present.  Cardiovascular: Normal rate, regular rhythm and intact distal pulses.   Pulmonary/Chest: Effort normal and breath sounds normal. No stridor. No respiratory distress. He has no wheezes. He has no rales.  Abdominal: Soft. Bowel sounds are normal. He exhibits no distension. There is no tenderness. There is no rebound and no guarding.  Musculoskeletal: He exhibits no edema and no tenderness.  Neurological: He is alert. He has normal strength. No sensory deficit. Cranial nerve deficit:  no gross defecits noted. He exhibits normal muscle tone. He displays no seizure activity. Coordination normal.  Skin: Skin is warm and dry. No rash noted.  Psychiatric: He has a normal mood and affect.    ED Course  Procedures (including critical care time)  Medications  moxifloxacin (AVELOX) 400 MG tablet (not administered)  albuterol (PROVENTIL HFA;VENTOLIN HFA) 108 (90 BASE) MCG/ACT inhaler (not administered)  HYDROcodone-homatropine (HYCODAN) 5-1.5 MG/5ML syrup (not administered)  predniSONE (STERAPRED UNI-PAK) 10 MG tablet (not administered)  diphenhydrAMINE (BENADRYL) 25 MG tablet (not administered)  diphenhydrAMINE (BENADRYL) injection 25 mg (25 mg Intravenous Given 10/28/11 0922)  methylPREDNISolone sodium succinate (SOLU-MEDROL) 125 mg/2 mL injection 125 mg (125 mg Intravenous Given 10/28/11 0923)  famotidine (PEPCID) IVPB 20 mg (20 mg Intravenous Given 10/28/11 0925)    Labs Reviewed  POCT I-STAT, CHEM 8 - Abnormal; Notable for the following:    Hemoglobin 12.6 (*)    HCT 37.0 (*)    All other components within normal limits   No results found.   1. Angioedema of lips       MDM  Patient presents with an episode of angioedema. She does not take any ACE inhibitor type of blood pressure  medications. The only new medication that he has taken recently was Avelox. He has taken it before and never has had a problem I suggest the patient not take his last dose. He has no symptoms to suggest an infectious etiology for the swelling does not complain of any dental pain or sore throat. There is no evidence of facial cellulitis. Patient was treated with antihistamines with no significant change . He was monitored and has not been developing any worsening symptoms. There is no airway compromise and no difficulty swallowing. The patient be discharged home on antihistamines and steroids. I explained to him to follow up with his doctor this week to be rechecked. He has been instructed to  return to emergency room for any difficulty swallowing or breathing.        Celene Kras, MD 10/28/11 1055

## 2011-10-28 NOTE — Telephone Encounter (Signed)
Pt just left ER and they are wanting to put him back on prednisone . Pt wanted to check with doctor first. Please contact

## 2011-10-28 NOTE — ED Notes (Signed)
Patient reports that he began having swelling on the right side of his mouth. Patient took a Benadryl with decreased swelling. Today, patient states he woke up and noticed swelling on the left side of his mouth. Patient reports that his mouth "feels raw inside." Patient denies swallowing or breathing difficulties. Patient was started on Avelox  4 days ago.

## 2011-10-29 NOTE — Telephone Encounter (Signed)
Okay 

## 2011-10-30 ENCOUNTER — Encounter: Payer: Self-pay | Admitting: Cardiology

## 2011-10-30 ENCOUNTER — Ambulatory Visit (INDEPENDENT_AMBULATORY_CARE_PROVIDER_SITE_OTHER): Payer: 59 | Admitting: Cardiology

## 2011-10-30 VITALS — BP 148/80 | HR 81 | Ht 72.0 in | Wt 136.0 lb

## 2011-10-30 DIAGNOSIS — R0602 Shortness of breath: Secondary | ICD-10-CM

## 2011-10-30 DIAGNOSIS — R0600 Dyspnea, unspecified: Secondary | ICD-10-CM

## 2011-10-30 DIAGNOSIS — J449 Chronic obstructive pulmonary disease, unspecified: Secondary | ICD-10-CM

## 2011-10-30 DIAGNOSIS — F17200 Nicotine dependence, unspecified, uncomplicated: Secondary | ICD-10-CM

## 2011-10-30 NOTE — Patient Instructions (Signed)
Your physician recommends that you schedule a follow-up appointment in: AS NEEDED PENDING TEST RESULTS  Your physician has requested that you have a dobutamine myoview. For furth information please visit [email or web address] Please follow instruction sheet, as given.

## 2011-10-30 NOTE — Assessment & Plan Note (Signed)
Management per pulmonary. 

## 2011-10-30 NOTE — Assessment & Plan Note (Signed)
Patient presents for evaluation of dyspnea. He has severe lung disease on examination. His chest CT also showed severe emphysema. Echocardiogram shows normal LV function. BNP normal. I think his dyspnea is most likely related to severe COPD. There is note of atherosclerosis in his coronary arteries on CT scan. I cannot exclude a contribution from coronary disease. We will proceed with a dobutamine Myoview. If there is significant abnormality we will plan cardiac catheterization. I have explained to the patient and his wife that if we indeed find coronary disease and proceed with revascularization, his symptoms will not improve if they are all related to COPD. Our hope would be that if coronary disease is demonstrated and corrected, there may be some improvement overall.

## 2011-10-30 NOTE — Progress Notes (Signed)
HPI: 66 year old male with no prior cardiac history for evaluation of dyspnea. Patient does have a history of severe COPD. He is followed by [name]. Myoview in May 2000 and showed an ejection fraction of 69% and no ischemia or infarction. Echocardiogram in March of 2013 showed normal LV function, grade 1 diastolic dysfunction, and mildly elevated pulmonary pressures. Chest CT in March of 2013 showed a subpleural nodule in the right upper lobe. There was also a small nodule in the left lower lobe. Followup recommended. 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 There was extensive underlying emphysematous changes. There was also note of coronary atherosclerosis. BNP in April of 2013 was 61. Renal function was normal. Patient describes dyspnea on exertion for 3-4 years. However his symptoms have slowly gotten worse. He now has dyspnea with minimal exertion. There is no chest pain. There is no orthopnea, PND, pedal edema, palpitations, syncope. Because of his dyspnea we were asked to evaluate.  Current Outpatient Prescriptions  Medication Sig Dispense Refill  . ADVAIR DISKUS 250-50 MCG/DOSE AEPB USE ONE INHALATION TWO TIMES A DAY (OVERDUE FOR A FOLLOW UP APPOINTMENT)  180 each  3  . albuterol (PROVENTIL HFA;VENTOLIN HFA) 108 (90 BASE) MCG/ACT inhaler Inhale 2 puffs into the lungs every 6 (six) hours as needed.      . diphenhydrAMINE (BENADRYL) 25 MG tablet Take 1 tablet (25 mg total) by mouth every 6 (six) hours.  20 tablet  0  . fluticasone (FLONASE) 50 MCG/ACT nasal spray Place 2 sprays into the nose daily.  16 g  11  . SPIRIVA HANDIHALER 18 MCG inhalation capsule INHALE THE CONTENTS OF 1 CAPSULE DAILY (OVERDUE FOR A FOLLOW UP APPOINTMENT)  90 capsule  3  . terbinafine (LAMISIL) 250 MG tablet Take 250 mg by mouth See admin instructions. Take 1 tablet daily for 7 days every month      . moxifloxacin (AVELOX) 400 MG tablet Take 400 mg by mouth daily.        No Known Allergies  Past Medical  History  Diagnosis Date  . Onychomycosis     Dr. Melvyn Novas  . Chest x-ray abnormality   . Weight loss   . COPD (chronic obstructive pulmonary disease)     PFT's rec Jul 17, 2009  . Chronic rhinitis     -Sinus Ct 08/01/2009 >> Bilateral maxillary sinusitis with some mucosal thickeningin the sphenoid and frontal sinuses as well with air fluid levels present -chronic rhinitis flyer Aug 04, 2009    Past Surgical History  Procedure Date  . Appendectomy   . Tonsillectomy     History   Social History  . Marital Status: Married    Spouse Name: N/A    Number of Children: 2  . Years of Education: N/A   Occupational History  .     Social History Main Topics  . Smoking status: Current Some Day Smoker -- 2.0 packs/day for 10 years    Types: Pipe    Last Attempt to Quit: 07/09/1975  . Smokeless tobacco: Not on file   Comment: Started smoking cigs at age 75. Smoked 2ppd x 10 years. Now, pt states he smokes a pipe "all day long" x 25 years.   . Alcohol Use: 1.8 oz/week    3 Cans of beer per week     daily  . Drug Use: No  . Sexually Active: Not on file   Other Topics Concern  . Not on file   Social History Narrative  Married with children. Pt works as a Veterinary surgeon for [employer]    Family History  Problem Relation Age of Onset  . Heart disease Mother     CABG in her 82s  . Cancer Mother   . Stroke Father   . Heart disease Father     Died of MI at age 12  . Hepatitis Sister   . Coronary artery disease Other     male 1st degree relative <60    ROS:  no fevers or chills, productive cough, hemoptysis, dysphasia, odynophagia, melena, hematochezia, dysuria, hematuria, rash, seizure activity, orthopnea, PND, pedal edema, claudication. Remaining systems are negative.  Physical Exam:   Blood pressure 148/80, pulse 81, height 6' (1.829 m), weight 61.689 kg (136 lb).  General:  Well developed/well nourished in NAD Skin warm/dry Patient not depressed No peripheral  clubbing Back-normal HEENT-normal/normal eyelids Neck supple/normal carotid upstroke bilaterally; no bruits; no JVD; no thyromegaly chest - diminished breath sounds throughout, mild expiratory wheeze. Increased AP diameter. CV - extremely distant heart sounds, RRR/normal S1 and S2; no murmurs, rubs or gallops;  PMI nondisplaced Abdomen -NT/ND, no HSM, no mass, + bowel sounds, no bruit 2+ femoral pulses, no bruits Ext-no edema, chords, 2+ DP on the left and 1+ on the right Neuro-grossly nonfocal  ECG 10/23/2011-sinus rhythm at a rate of 104. Nonspecific ST changes.

## 2011-10-30 NOTE — Assessment & Plan Note (Signed)
Long discussion with patient today concerning need to discontinue all tobacco products.

## 2011-10-30 NOTE — Telephone Encounter (Signed)
Told pt ok to take

## 2011-11-07 ENCOUNTER — Ambulatory Visit (HOSPITAL_COMMUNITY): Payer: 59 | Attending: Cardiology | Admitting: Radiology

## 2011-11-07 VITALS — BP 141/85 | Ht 72.0 in | Wt 132.0 lb

## 2011-11-07 DIAGNOSIS — Z8249 Family history of ischemic heart disease and other diseases of the circulatory system: Secondary | ICD-10-CM | POA: Insufficient documentation

## 2011-11-07 DIAGNOSIS — J4489 Other specified chronic obstructive pulmonary disease: Secondary | ICD-10-CM | POA: Insufficient documentation

## 2011-11-07 DIAGNOSIS — F17200 Nicotine dependence, unspecified, uncomplicated: Secondary | ICD-10-CM | POA: Insufficient documentation

## 2011-11-07 DIAGNOSIS — R0989 Other specified symptoms and signs involving the circulatory and respiratory systems: Secondary | ICD-10-CM | POA: Insufficient documentation

## 2011-11-07 DIAGNOSIS — R0600 Dyspnea, unspecified: Secondary | ICD-10-CM

## 2011-11-07 DIAGNOSIS — R0602 Shortness of breath: Secondary | ICD-10-CM | POA: Insufficient documentation

## 2011-11-07 DIAGNOSIS — J449 Chronic obstructive pulmonary disease, unspecified: Secondary | ICD-10-CM | POA: Insufficient documentation

## 2011-11-07 DIAGNOSIS — R0609 Other forms of dyspnea: Secondary | ICD-10-CM | POA: Insufficient documentation

## 2011-11-07 MED ORDER — SODIUM CHLORIDE 0.9 % IV SOLN
40.0000 ug/kg | INTRAVENOUS | Status: DC
  Administered 2011-11-07: 40 ug/kg/min via INTRAVENOUS

## 2011-11-07 MED ORDER — TECHNETIUM TC 99M TETROFOSMIN IV KIT
11.0000 | PACK | Freq: Once | INTRAVENOUS | Status: AC | PRN
  Administered 2011-11-07: 11 via INTRAVENOUS

## 2011-11-07 MED ORDER — TECHNETIUM TC 99M TETROFOSMIN IV KIT
33.0000 | PACK | Freq: Once | INTRAVENOUS | Status: AC | PRN
  Administered 2011-11-07: 33 via INTRAVENOUS

## 2011-11-07 NOTE — Progress Notes (Signed)
[facility] SITE 3 NUCLEAR MED [address] [place] [address] [phone]  Cardiology Nuclear Med Study  Francisco [name] is a 66 y.o. male     MRN : [id number]     DOB: [birth year]  Procedure Date: 11/07/2011  Nuclear Med Background Indication for Stress Test:  Evaluation for Ischemia History:  History of severe COPD; 2010- Normal MPS. EF= 69%; 3/13- Echo- EF= 60-65% Cardiac Risk Factors: Family History - CAD and Smoker  Symptoms:  DOE and SOB   Nuclear Pre-Procedure Caffeine/Decaff Intake:  None NPO After: 7:00pm   Lungs:  clear O2 Sat: 95% on room air. IV 0.9% NS with Angio Cath:  22g  IV Site: L Forearm  IV Started by:  Burna Mortimer Deal, RT-N  Chest Size (in):  39 Cup Size: n/a  Height: 6' (1.829 m) Weight:  132 lb (59.875 kg)  BMI:  Body mass index is 17.90 kg/(m^2). Tech Comments: Patient hydrated prior to Dobutamine infusion with 250cc 0.9% NaCl. W.Deal,RT-N    Nuclear Med Study 1 or 2 day study: 1 day  Stress Test Type:  Dobutamine  Reading MD: Cassell Clement, MD  Order Authorizing Provider:  Ripley Fraise  Resting Radionuclide: Technetium 29m Tetrofosmin  Resting Radionuclide Dose: 11.0 mCi   Stress Radionuclide:  Technetium 79m Tetrofosmin  Stress Radionuclide Dose: 33.0 mCi           Stress Protocol Rest HR: 73 Stress HR: 141  Rest BP: 141/85 Stress BP: 136/74  Exercise Time (min): n/a METS: n/a   Predicted Max HR: 156 bpm % Max HR: 90.38 bpm Rate Pressure Product: 21308   Dose of Adenosine (mg):  n/a Dose of Lexiscan: n/a mg  Dose of Atropine (mg): n/a Dose of Dobutamine: 40 mcg/kg/min (at max HR)  Stress Test Technologist: Bonnita Levan, RN  Nuclear Technologist:  Doyne Keel, CNMT     Rest Procedure:  Myocardial perfusion imaging was performed at rest 45 minutes following the intravenous administration of Technetium 65m Tetrofosmin. Rest ECG: No acute changes  Stress Procedure:  The patient received IV dobutamine and no IV  atropine.  There were no significant changes with infusion.  Technetium 55m Tetrofosmin was injected at peak heart rate and quantitative spect images were obtained after a 45 minute delay. Stress ECG: No significant change from baseline ECG  QPS Raw Data Images:  Normal; no motion artifact; normal heart/lung ratio. Stress Images:  Normal homogeneous uptake in all areas of the myocardium. Rest Images:  Normal homogeneous uptake in all areas of the myocardium. Subtraction (SDS):  No evidence of ischemia. Transient Ischemic Dilatation (Normal <1.22):  1.02 Lung/Heart Ratio (Normal <0.45):  0.38  Quantitative Gated Spect Images QGS EDV:  78 ml QGS ESV:  27 ml  Impression Exercise Capacity:  Poor exercise capacity. BP Response:  Normal blood pressure response. Clinical Symptoms:  No chest pain. ECG Impression:  No significant ST segment change suggestive of ischemia. Comparison with Prior Nuclear Study: No significant change from previous study  Overall Impression:  Normal stress nuclear study.  LV Ejection Fraction: 66%.  LV Wall Motion:  NL LV Function; NL Wall Motion  [employer]

## 2011-11-15 ENCOUNTER — Ambulatory Visit: Payer: 59 | Admitting: Cardiology

## 2012-01-15 ENCOUNTER — Other Ambulatory Visit (INDEPENDENT_AMBULATORY_CARE_PROVIDER_SITE_OTHER): Payer: 59

## 2012-01-15 DIAGNOSIS — Z0000 Encounter for general adult medical examination without abnormal findings: Secondary | ICD-10-CM

## 2012-01-15 LAB — LIPID PANEL
HDL: 89.3 mg/dL (ref >39.00)
Total CHOL/HDL Ratio: 3
Triglycerides: 94 mg/dL (ref 0.0–149.0)

## 2012-01-15 LAB — CBC WITH DIFFERENTIAL/PLATELET
Basophils Absolute: 0 10*3/uL (ref 0.0–0.1)
Basophils Relative: 0.6 % (ref 0.0–3.0)
Eosinophils Absolute: 0.2 10*3/uL (ref 0.0–0.7)
Lymphocytes Relative: 25 % (ref 12.0–46.0)
MCHC: 33.3 g/dL (ref 30.0–36.0)
Monocytes Relative: 9.8 % (ref 3.0–12.0)
Neutrophils Relative %: 61.8 % (ref 43.0–77.0)
RBC: 4.83 Mil/uL (ref 4.22–5.81)
RDW: 13.7 % (ref 11.5–14.6)

## 2012-01-15 LAB — POCT URINALYSIS DIPSTICK
Leukocytes, UA: NEGATIVE
Nitrite, UA: NEGATIVE
Protein, UA: NEGATIVE
Urobilinogen, UA: 0.2
pH, UA: 5.5

## 2012-01-15 LAB — BASIC METABOLIC PANEL
CO2: 30 mEq/L (ref 19–32)
Calcium: 9.6 mg/dL (ref 8.4–10.5)
Creatinine, Ser: 0.9 mg/dL (ref 0.4–1.5)
GFR: 90.2 mL/min (ref >60.00)

## 2012-01-15 LAB — HEPATIC FUNCTION PANEL
Alkaline Phosphatase: 72 U/L (ref 39–117)
Bilirubin, Direct: 0.1 mg/dL (ref 0.0–0.3)

## 2012-01-17 ENCOUNTER — Telehealth: Payer: Self-pay | Admitting: Internal Medicine

## 2012-01-17 NOTE — Telephone Encounter (Signed)
Labs faxed

## 2012-01-17 NOTE — Telephone Encounter (Signed)
Pt called and is req that a copy of his labs be sent to Dr Karlyn Agee at [facility].

## 2012-01-22 ENCOUNTER — Encounter: Payer: 59 | Admitting: Internal Medicine

## 2012-01-27 ENCOUNTER — Inpatient Hospital Stay: Admission: RE | Admit: 2012-01-27 | Payer: 59 | Source: Ambulatory Visit

## 2012-01-29 ENCOUNTER — Ambulatory Visit (INDEPENDENT_AMBULATORY_CARE_PROVIDER_SITE_OTHER): Payer: 59 | Admitting: Internal Medicine

## 2012-01-29 ENCOUNTER — Encounter: Payer: Self-pay | Admitting: Internal Medicine

## 2012-01-29 ENCOUNTER — Encounter: Payer: 59 | Admitting: Internal Medicine

## 2012-01-29 VITALS — BP 132/74 | HR 68 | Temp 98.2°F | Ht 71.0 in | Wt 136.0 lb

## 2012-01-29 DIAGNOSIS — Z2911 Encounter for prophylactic immunotherapy for respiratory syncytial virus (RSV): Secondary | ICD-10-CM

## 2012-01-29 DIAGNOSIS — Z0000 Encounter for general adult medical examination without abnormal findings: Secondary | ICD-10-CM

## 2012-01-29 NOTE — Progress Notes (Signed)
Patient ID: Francisco [name], male   DOB: [birth year], 66 y.o.   MRN: [id number] cpx  Past Medical History  Diagnosis Date  . Onychomycosis     Dr. Melvyn Novas  . Chest x-ray abnormality   . Weight loss   . COPD (chronic obstructive pulmonary disease)     PFT's rec Jul 17, 2009  . Chronic rhinitis     -Sinus Ct 08/01/2009 >> Bilateral maxillary sinusitis with some mucosal thickeningin the sphenoid and frontal sinuses as well with air fluid levels present -chronic rhinitis flyer Aug 04, 2009    History   Social History  . Marital Status: Married    Spouse Name: N/A    Number of Children: 2  . Years of Education: N/A   Occupational History  .     Social History Main Topics  . Smoking status: Current Some Day Smoker -- 2.0 packs/day for 10 years    Types: Pipe    Last Attempt to Quit: 07/09/1975  . Smokeless tobacco: Not on file   Comment: Started smoking cigs at age 62. Smoked 2ppd x 10 years. Now, pt states he smokes a pipe "all day long" x 25 years.   . Alcohol Use: 1.8 oz/week    3 Cans of beer per week     daily  . Drug Use: No  . Sexually Active: Not on file   Other Topics Concern  . Not on file   Social History Narrative   Married with children. Pt works as a Veterinary surgeon for [employer]    Past Surgical History  Procedure Date  . Appendectomy   . Tonsillectomy     Family History  Problem Relation Age of Onset  . Heart disease Mother     CABG in her 88s  . Cancer Mother   . Stroke Father   . Heart disease Father     Died of MI at age 39  . Hepatitis Sister   . Coronary artery disease Other     male 1st degree relative <60    No Known Allergies  Current Outpatient Prescriptions on File Prior to Visit  Medication Sig Dispense Refill  . ADVAIR DISKUS 250-50 MCG/DOSE AEPB USE ONE INHALATION TWO TIMES A DAY (OVERDUE FOR A FOLLOW UP APPOINTMENT)  180 each  3  . albuterol (PROVENTIL HFA;VENTOLIN HFA) 108 (90 BASE) MCG/ACT inhaler Inhale 2 puffs into the  lungs every 6 (six) hours as needed.      . fluticasone (FLONASE) 50 MCG/ACT nasal spray Place 2 sprays into the nose daily.  16 g  11  . SPIRIVA HANDIHALER 18 MCG inhalation capsule INHALE THE CONTENTS OF 1 CAPSULE DAILY (OVERDUE FOR A FOLLOW UP APPOINTMENT)  90 capsule  3  . terbinafine (LAMISIL) 250 MG tablet Take 250 mg by mouth See admin instructions. Take 1 tablet daily for 7 days every month         patient denies chest pain, shortness of breath, orthopnea. Denies lower extremity edema, abdominal pain, change in appetite, change in bowel movements. Patient denies rashes, musculoskeletal complaints. No other specific complaints in a complete review of systems.   BP 132/74  Pulse 68  Temp 98.2 F (36.8 C) (Oral)  Ht 5\' 11"  (1.803 m)  Wt 136 lb (61.689 kg)  BMI 18.97 kg/m2 Well-developed male in no acute distress. HEENT exam atraumatic, normocephalic, extraocular muscles are intact. Conjunctivae are pink without exudate. Neck is supple without lymphadenopathy, thyromegaly, jugular venous distention. Chest is clear  to auscultation without increased work of breathing. Cardiac exam S1-S2 are regular. The PMI is normal. No significant murmurs or gallops. Abdominal exam active bowel sounds, soft, nontender. No abdominal bruits. Extremities no clubbing cyanosis or edema. Peripheral pulses are normal without bruits. Neurologic exam alert and oriented without any motor or sensory deficits.   Well visit: health maint UTD Advised absolute smoking cessation

## 2012-02-13 ENCOUNTER — Ambulatory Visit: Payer: 59 | Admitting: Pulmonary Disease

## 2012-02-14 ENCOUNTER — Ambulatory Visit (INDEPENDENT_AMBULATORY_CARE_PROVIDER_SITE_OTHER)
Admission: RE | Admit: 2012-02-14 | Discharge: 2012-02-14 | Disposition: A | Discharge status: Home / Self Care | Payer: 59 | Source: Ambulatory Visit | Attending: Pulmonary Disease | Admitting: Pulmonary Disease

## 2012-02-14 DIAGNOSIS — R911 Solitary pulmonary nodule: Secondary | ICD-10-CM

## 2012-02-14 IMAGING — CT CT CHEST W/O CM
2 of 3 series · 15 of 36 positions shown, 18 images · IV contrast (Omnipaque 300)
Comparison: CT [DATE]

CLINICAL DATA: Follow-up nodules.

CT CHEST WITHOUT CONTRAST
TECHNIQUE: Multidetector CT imaging of the chest was performed
following the standard protocol without IV contrast.

[Series 2: chest routine with · axial · 0.71mm/px · z∈[-368,-62]mm · 12 of 73 slices shown, 15 images]
[im 6/73  mediastinal]
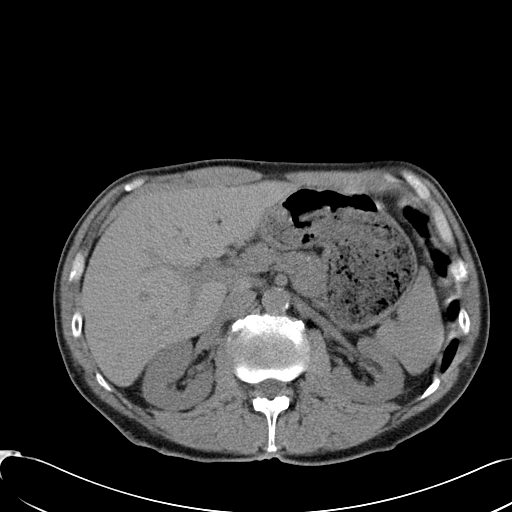
[im 6/73  lung]
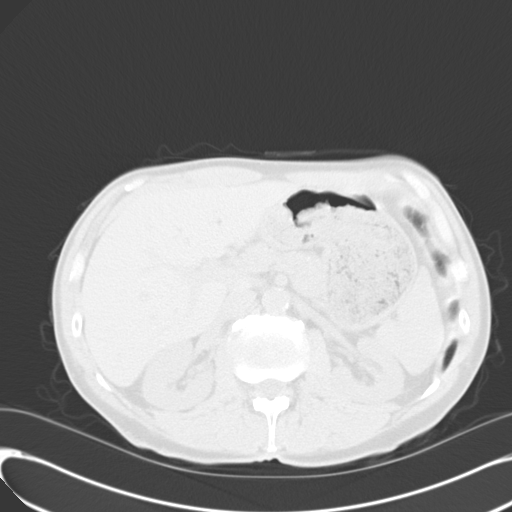
[im 11/73  lung]
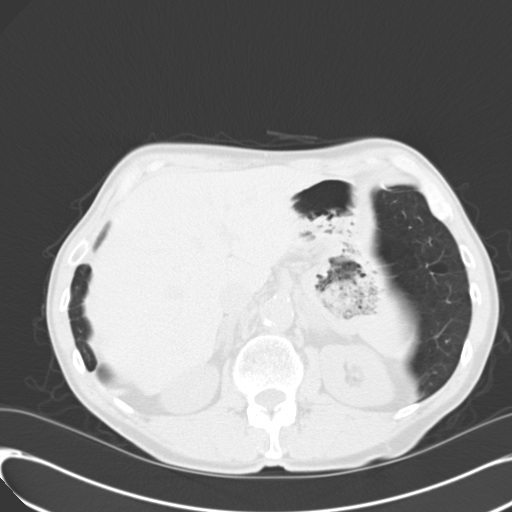
[im 17/73  lung]
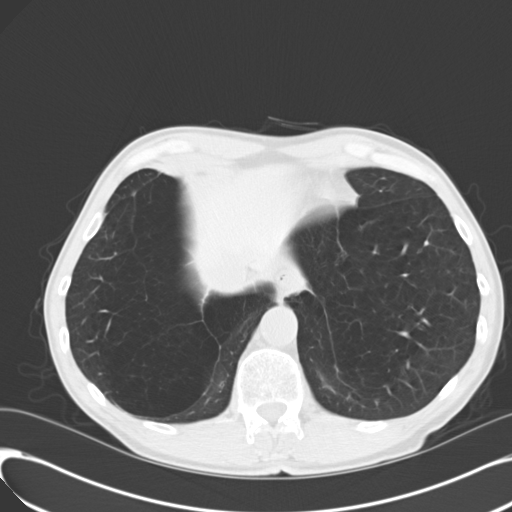
[im 22/73  lung]
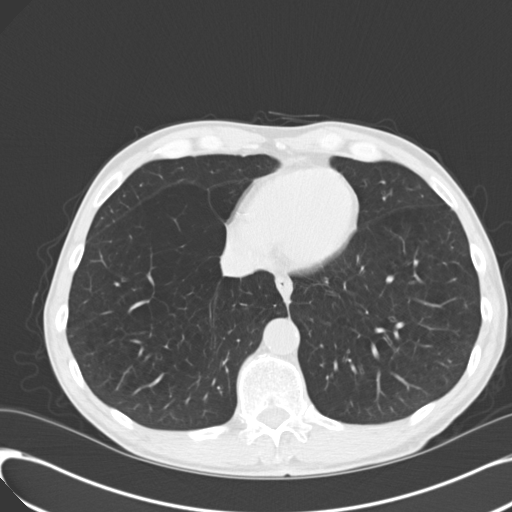
[im 27/73  mediastinal]
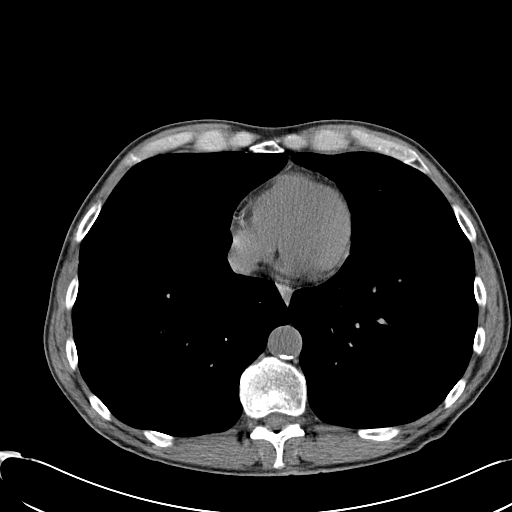
[im 27/73  lung]
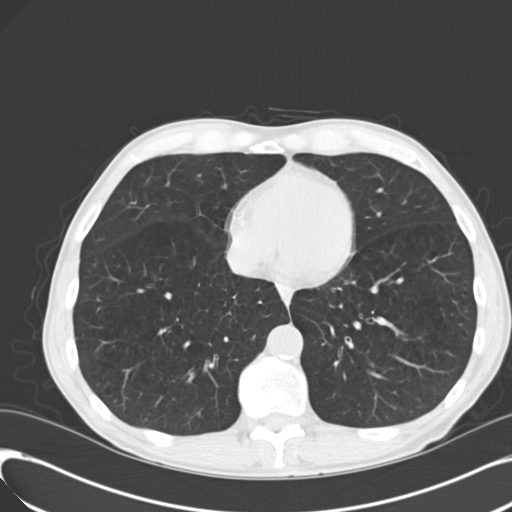
[im 33/73  lung]
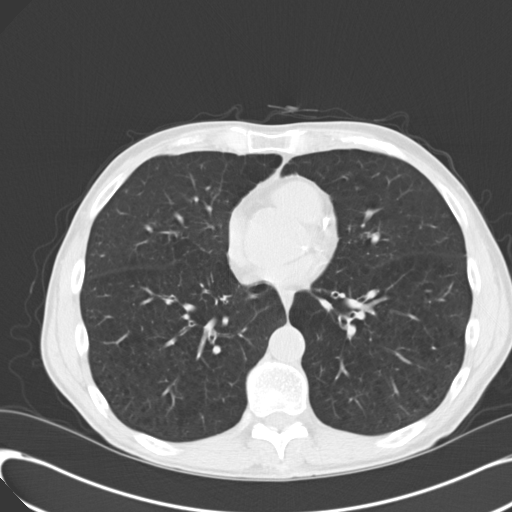
[im 41/73  lung]
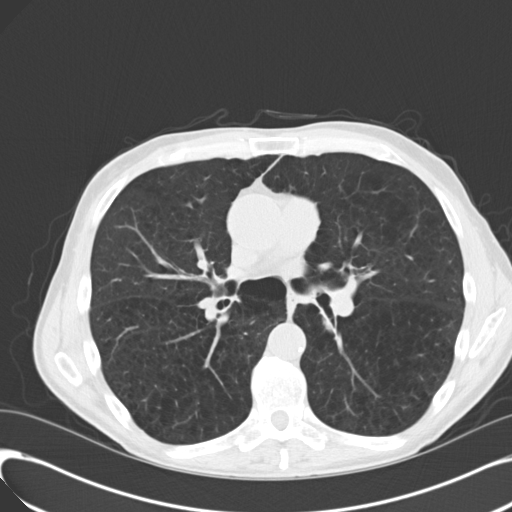
[im 46/73  lung]
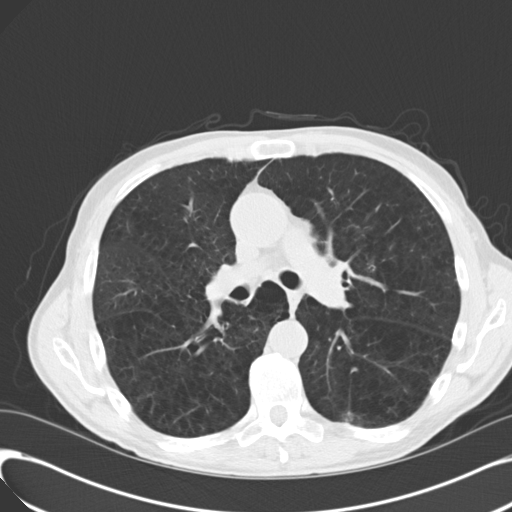
[im 51/73  mediastinal]
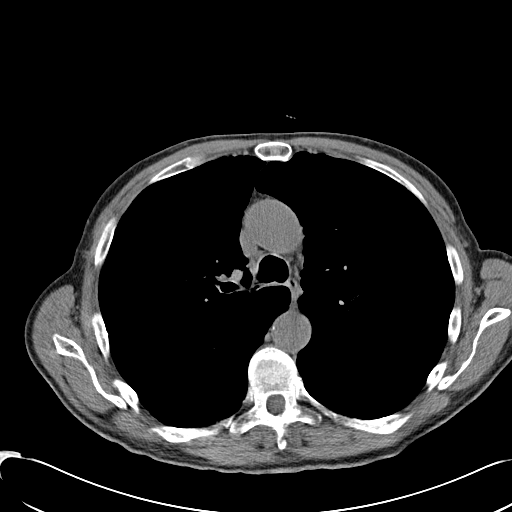
[im 51/73  lung]
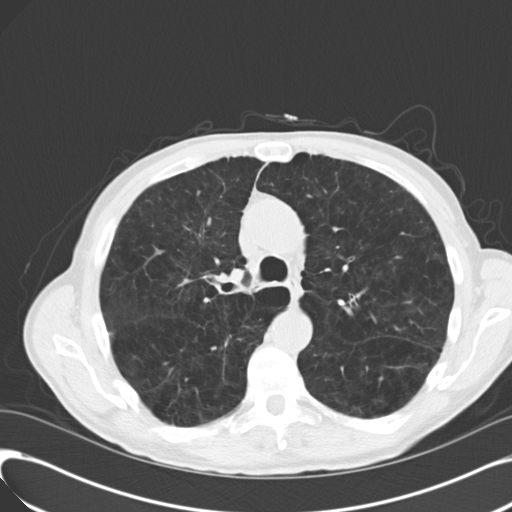
[im 57/73  lung]
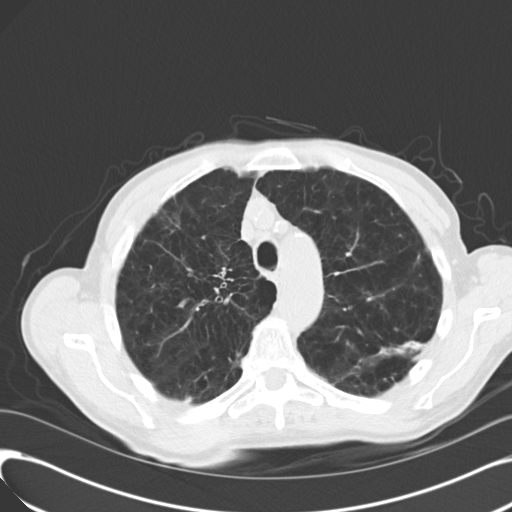
[im 62/73  lung]
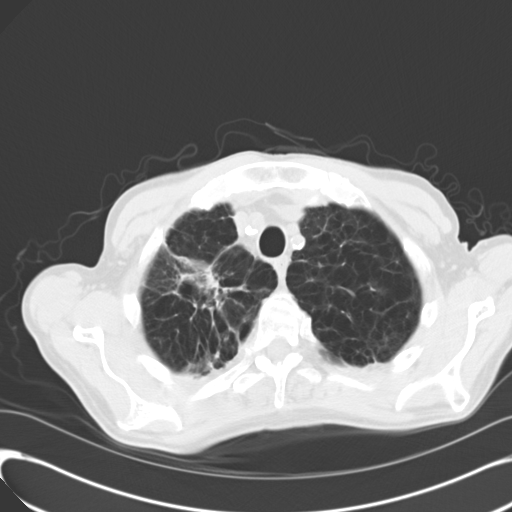
[im 67/73  lung]
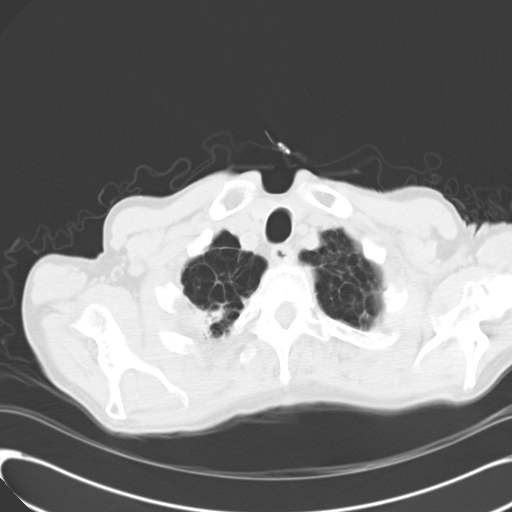

[Series 602: cor · coronal · 0.71mm/px · 3 of 109 slices shown]
[im 22/109  lung]
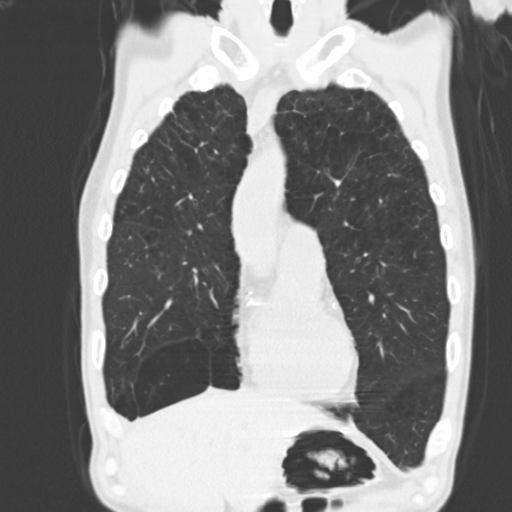
[im 44/109  lung]
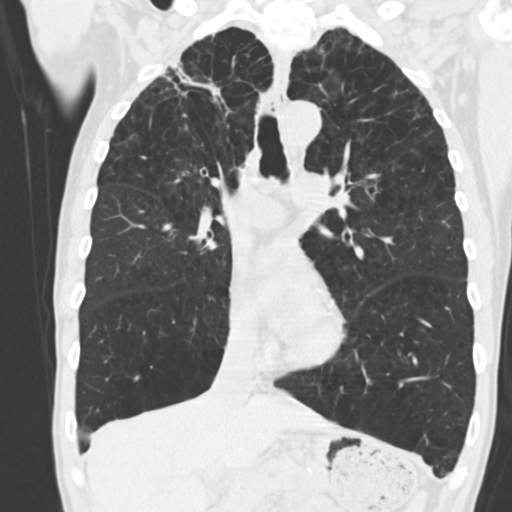
[im 65/109  lung]
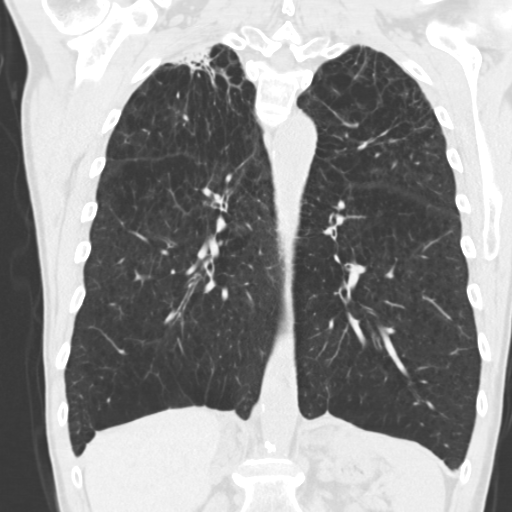

[15 of 36 positions shown; findings below may reference images not displayed]

FINDINGS: The nodule of concern on comparison CT in the right upper lobe is
decreased in size measuring 4 mm x 2 mm compared to 8 x 5 mm on
prior.  Small nodule subpleural left lower lobe is unchanged.

There is extensive scarring and peribronchial consolidation in the
right upper lobe which is stable compared to prior.  There is
bronchiectasis within this scarring seen on coronal projection.  No
new nodularity present.  Similar scarring in the left upper lobe is
stable.

Central airways are normal.  No pericardial fluid.  Small
paratracheal lymph nodes unchanged in size.  Largest is a
precarinal lymph node measuring the 11 mm unchanged from 9  mm on
prior.  No axillary lymphadenopathy.  Limited view of the upper
abdomen demonstrates thickening of the adrenal glands which is
unchanged from prior.

Review of the bone windows demonstrates extensive degenerative
change
IMPRESSION: 1.  Nodular lesion of concern in the right upper lobe is resolved
in the interval consistent with benign etiology.

2,  Extensive scarring  and consolidation in the right upper lobe.
t.  Due to smoking history and emphysema recommend follow-up CT in
6 to 12 months.
3.  Mild mediastinal lymphadenopathy likely related to COPD.
4.  Stable thickening of the adrenal glands.

## 2012-02-17 ENCOUNTER — Encounter: Payer: Self-pay | Admitting: Pulmonary Disease

## 2012-02-18 ENCOUNTER — Telehealth: Payer: Self-pay | Admitting: Pulmonary Disease

## 2012-02-18 DIAGNOSIS — R911 Solitary pulmonary nodule: Secondary | ICD-10-CM

## 2012-02-18 NOTE — Progress Notes (Signed)
Quick Note:  Spoke with pt. Informed him of results and recs per [name]. He verbalized understanding of this. Order was placed for CT Chest without contrast in 12 months. ______

## 2012-02-18 NOTE — Telephone Encounter (Signed)
Called, spoke with pt.  I informed him of below per [name].  He verbalized understanding of this.  We have scheduled him to see [name] on Apr 24, 2012 at 9 am -- pt aware of appt and will call back if anything is needed prior to this.

## 2012-02-18 NOTE — Telephone Encounter (Signed)
Lawson Fiscal was calling to inform pt:   Notes Recorded by Barbaraann Share, MD on 02/17/2012 at 1:46 PM Please let pt know that spot in top of right lung is smaller compared to prior ct. Great news! The radiologist has recommended one more f/u in 12mos. Will need scan next summer.   -------  Called, spoke with pt.  I informed him of above CT Chest results and recs. He verbalized understanding of this.  States he had a f/u with KC on Aug 8 but cancelled this bc insurance did not approve the CT until last week.  States breathing is at baseline.  Would like to know if [facility] would like him to come in for OV to f/u now.  KC, pls advise if/when you would like pt to come in for OV.  Thank you.    Note:  I have placed order for CT Chest without contrast in 12 months per Lawson Fiscal.

## 2012-02-18 NOTE — Telephone Encounter (Signed)
I am ok if he wishes to wait until October to f/u if his breathing is at baseline.

## 2012-03-12 ENCOUNTER — Other Ambulatory Visit: Payer: Self-pay | Admitting: Pulmonary Disease

## 2012-03-19 ENCOUNTER — Telehealth: Payer: Self-pay | Admitting: Pulmonary Disease

## 2012-03-19 MED ORDER — FLUTICASONE-SALMETEROL 250-50 MCG/DOSE IN AEPB: 1.0000 | INHALATION_SPRAY | Freq: Two times a day (BID) | RESPIRATORY_TRACT | Status: DC

## 2012-03-19 MED ORDER — TIOTROPIUM BROMIDE MONOHYDRATE 18 MCG IN CAPS: 18.0000 ug | ORAL_CAPSULE | Freq: Every day | RESPIRATORY_TRACT | Status: DC

## 2012-03-19 NOTE — Telephone Encounter (Signed)
Rxs were sent to pharm  LMOM for the pt to be made aware   

## 2012-03-24 ENCOUNTER — Telehealth: Payer: Self-pay | Admitting: Pulmonary Disease

## 2012-03-24 NOTE — Telephone Encounter (Signed)
I spoke with [facility] and was advised they did receive RX for pt spiriva. Order was placed as 2 separate shipment and this medication should go out to pt today in the mail. I called and made spouse aware of this. Nothing further was needed

## 2012-04-06 ENCOUNTER — Other Ambulatory Visit: Payer: Self-pay | Admitting: Dermatology

## 2012-04-24 ENCOUNTER — Encounter: Payer: Self-pay | Admitting: Pulmonary Disease

## 2012-04-24 ENCOUNTER — Ambulatory Visit (INDEPENDENT_AMBULATORY_CARE_PROVIDER_SITE_OTHER): Payer: 59 | Admitting: Pulmonary Disease

## 2012-04-24 VITALS — BP 122/72 | HR 78 | Temp 98.1°F | Ht 72.0 in | Wt 136.8 lb

## 2012-04-24 DIAGNOSIS — J4489 Other specified chronic obstructive pulmonary disease: Secondary | ICD-10-CM

## 2012-04-24 DIAGNOSIS — J449 Chronic obstructive pulmonary disease, unspecified: Secondary | ICD-10-CM

## 2012-04-24 MED ORDER — FLUTICASONE PROPIONATE 50 MCG/ACT NA SUSP: 2.0000 | Freq: Every day | NASAL | Status: DC

## 2012-04-24 NOTE — Patient Instructions (Addendum)
No change in medications. Stay as active as possible. followup with me in 6mos, but call if having worsening symptoms.

## 2012-04-24 NOTE — Progress Notes (Signed)
  Subjective:    Patient ID: Francisco [name], male    DOB: Jan 22, 1946, 66 y.o.   MRN: [id number]  HPI Patient comes in today for followup of his known severe COPD.  He has had a shift in his baseline dyspnea on exertion this year, and has never returned to his prior level.  He has been treated appropriately, and has participated in pulmonary rehabilitation.  He has had a recent cardiac evaluation with nothing being found.  He does not feel that his breathing is any worse than his last visit.  He denies any chest congestion or cough with mucous production.  He does have ongoing sinus issues, and this often contributes to his acute exacerbation.   Review of Systems  Constitutional: Negative for fever and unexpected weight change.  HENT: Negative for ear pain, nosebleeds, congestion, sore throat, rhinorrhea, sneezing, trouble swallowing, dental problem, postnasal drip and sinus pressure.   Eyes: Negative for redness and itching.  Respiratory: Positive for cough, shortness of breath and wheezing. Negative for chest tightness.   Cardiovascular: Negative for palpitations and leg swelling.  Gastrointestinal: Negative for nausea and vomiting.  Genitourinary: Negative for dysuria.  Musculoskeletal: Negative for joint swelling.  Skin: Negative for rash.  Neurological: Negative for headaches.  Hematological: Bruises/bleeds easily.  Psychiatric/Behavioral: Negative for dysphoric mood. The patient is not nervous/anxious.        Objective:   Physical Exam Thin male in no acute distress Nose without purulent discharge noted Chest with very decreased breath sounds, one isolated wheeze on the left, no rhonchi Cardiac exam with regular rate and rhythm Lower extremities without edema, no cyanosis Alert and oriented, moves all 4 extremities.       Assessment & Plan:

## 2012-04-24 NOTE — Assessment & Plan Note (Signed)
The patient continues to have dyspnea on exertion, and I suspect it is a natural progression of his disease process.  He is on excellent medication, does not have ambulatory desaturation, has been referred to pulmonary rehabilitation, and has had a negative cardiac workup recently.  At this point, I do not think there is anything more to do from a medical standpoint.  I have stressed to him the importance of an ongoing conditioning program, and how it has the greatest impact on quality of life.  He is to call if worsening sob.

## 2012-05-21 ENCOUNTER — Other Ambulatory Visit: Payer: Self-pay | Admitting: *Deleted

## 2012-05-21 MED ORDER — FLUTICASONE PROPIONATE 50 MCG/ACT NA SUSP: 2.0000 | Freq: Every day | NASAL | Status: DC

## 2012-07-06 ENCOUNTER — Ambulatory Visit: Payer: 59 | Admitting: Internal Medicine

## 2012-07-06 ENCOUNTER — Ambulatory Visit (INDEPENDENT_AMBULATORY_CARE_PROVIDER_SITE_OTHER): Payer: 59 | Admitting: Internal Medicine

## 2012-07-06 ENCOUNTER — Encounter: Payer: Self-pay | Admitting: Internal Medicine

## 2012-07-06 VITALS — BP 114/70 | HR 98 | Ht 73.0 in | Wt 136.0 lb

## 2012-07-06 DIAGNOSIS — F17200 Nicotine dependence, unspecified, uncomplicated: Secondary | ICD-10-CM

## 2012-07-06 DIAGNOSIS — J4489 Other specified chronic obstructive pulmonary disease: Secondary | ICD-10-CM

## 2012-07-06 DIAGNOSIS — J449 Chronic obstructive pulmonary disease, unspecified: Secondary | ICD-10-CM

## 2012-07-06 MED ORDER — FLUTICASONE-SALMETEROL 250-50 MCG/DOSE IN AEPB: 1.0000 | INHALATION_SPRAY | Freq: Two times a day (BID) | RESPIRATORY_TRACT | Status: DC

## 2012-07-06 MED ORDER — AMOXICILLIN-POT CLAVULANATE 875-125 MG PO TABS: 1.0000 | ORAL_TABLET | Freq: Two times a day (BID) | ORAL | Status: DC

## 2012-07-06 MED ORDER — TIOTROPIUM BROMIDE MONOHYDRATE 18 MCG IN CAPS: 18.0000 ug | ORAL_CAPSULE | Freq: Every day | RESPIRATORY_TRACT | Status: DC

## 2012-07-06 NOTE — Patient Instructions (Addendum)
[facility]  Extra fluids and avoid chiling. Stop smoking  Scripts refilling Advair and Spiriva sent to [facility]  Please call as needed  See Dr Shelle Iron as planned

## 2012-07-06 NOTE — Progress Notes (Signed)
  Subjective:    Patient ID: Francisco [name], male    DOB: [birth year], 66 y.o.   MRN: [id number]  HPI Patient comes in today for followup of his known severe COPD.  He has had a shift in his baseline dyspnea on exertion this year, and has never returned to his prior level.  He has been treated appropriately, and has participated in pulmonary rehabilitation.  He has had a recent cardiac evaluation with nothing being found.  He does not feel that his breathing is any worse than his last visit.  He denies any chest congestion or cough with mucous production.  He does have ongoing sinus issues, and this often contributes to his acute exacerbation.  07/06/12- 66 yoM followed by Dr Shelle Iron for severe COPD Acute visit- CDY- having lots of congestion in his chest. coughing with production of green mucus. onset was 1 week ago. Fever at onset. Sinus pressure and nasal discharge spread quickly to his chest. Coughing as above. Feels right maxillary pressure now with postnasal drip but no headache and no fever in last couple of days.  Advair and Spiriva are being refilled.  ROS correct except as per HPI Review of Systems  Constitutional: Negative for fever and unexpected weight change.  HENT: Negative for ear pain, nosebleeds, congestion, sore throat, rhinorrhea, sneezing, trouble swallowing, dental problem, postnasal drip and sinus pressure.   Eyes: Negative for redness and itching.  Respiratory: Positive for cough, shortness of breath and wheezing. Negative for chest tightness.   Cardiovascular: Negative for palpitations and leg swelling.  Gastrointestinal: Negative for nausea and vomiting.  Genitourinary: Negative for dysuria.  Musculoskeletal: Negative for joint swelling.  Skin: Negative for rash.  Neurological: Negative for headaches.  Hematological: Bruises/bleeds easily.  Psychiatric/Behavioral: Negative for dysphoric mood. The patient is not nervous/anxious.       Objective:   Physical Exam OBJ-  Physical Exam General- Alert, Oriented, Affect-appropriate, Distress- none acute Skin- rash-none, lesions- none, excoriation- none Lymphadenopathy- none Head- atraumatic            Eyes- Gross vision intact, PERRLA, conjunctivae and secretions clear            Ears- Hearing, canals-normal            Nose- Clear, no-Septal dev, mucus, polyps, erosion, perforation             Throat- Mallampati II , mucosa clear , drainage- none, tonsils- atrophic Neck- flexible , trachea midline, no stridor , thyroid nl, carotid no bruit Chest - symmetrical excursion , unlabored           Heart/CV- RRR , no murmur , no gallop  , no rub, nl s1 s2                           - JVD- none , edema- none, stasis changes- none, varices- none           Lung- + decreased breath sounds with slight wheeze and scattered rhonchi, unlabored , dullness-none, rub- none           Chest wall-  Abd-  Br/ Gen/ Rectal- Not done, not indicated Extrem- cyanosis- none, clubbing, none, atrophy- none, strength- nl Neuro- grossly intact to observation    Assessment & Plan:

## 2012-07-10 ENCOUNTER — Encounter: Payer: Self-pay | Admitting: Family Medicine

## 2012-07-10 ENCOUNTER — Ambulatory Visit (INDEPENDENT_AMBULATORY_CARE_PROVIDER_SITE_OTHER): Payer: 59 | Admitting: Family Medicine

## 2012-07-10 VITALS — BP 120/70 | HR 95 | Temp 97.3°F | Wt 140.0 lb

## 2012-07-10 DIAGNOSIS — R202 Paresthesia of skin: Secondary | ICD-10-CM

## 2012-07-10 DIAGNOSIS — J3489 Other specified disorders of nose and nasal sinuses: Secondary | ICD-10-CM

## 2012-07-10 DIAGNOSIS — T887XXA Unspecified adverse effect of drug or medicament, initial encounter: Secondary | ICD-10-CM

## 2012-07-10 DIAGNOSIS — T50905A Adverse effect of unspecified drugs, medicaments and biological substances, initial encounter: Secondary | ICD-10-CM

## 2012-07-10 DIAGNOSIS — R209 Unspecified disturbances of skin sensation: Secondary | ICD-10-CM

## 2012-07-10 DIAGNOSIS — R0981 Nasal congestion: Secondary | ICD-10-CM

## 2012-07-10 NOTE — Progress Notes (Addendum)
Chief Complaint  Patient presents with  . left foot swelling    tingling, numbenss since yesterday     HPI:  Acute visit for left foot discomfort: -started: yesterday -symptoms: tingling sensation, swollen --> now resolved- has been fighting sinus infection and was started on augmentin (stopped this because he thought this was a reaction to the drug) -denies: fevers, chills, injury, pruritis, pain, redness -has tried: elevation -hx: denies hx of gout, drug reaction   ROS: See pertinent positives and negatives per HPI.  Past Medical History  Diagnosis Date  . Onychomycosis     Dr. Melvyn Novas  . Chest x-ray abnormality   . Weight loss   . COPD (chronic obstructive pulmonary disease)     PFT's rec Jul 17, 2009  . Chronic rhinitis     -Sinus Ct 08/01/2009 >> Bilateral maxillary sinusitis with some mucosal thickeningin the sphenoid and frontal sinuses as well with air fluid levels present -chronic rhinitis flyer Aug 04, 2009    Family History  Problem Relation Age of Onset  . Heart disease Mother     CABG in her 24s  . Cancer Mother   . Stroke Father   . Heart disease Father     Died of MI at age 68  . Hepatitis Sister   . Coronary artery disease Other     male 1st degree relative <60    History   Social History  . Marital Status: Married    Spouse Name: N/A    Number of Children: 2  . Years of Education: N/A   Occupational History  .     Social History Main Topics  . Smoking status: Current Some Day Smoker -- 2.0 packs/day for 10 years    Types: Pipe    Last Attempt to Quit: 07/09/1975  . Smokeless tobacco: None     Comment: Started smoking cigs at age 71. Smoked 2ppd x 10 years. Now, pt states he smokes a pipe "all day long" x 25 years.   . Alcohol Use: 1.8 oz/week    3 Cans of beer per week     Comment: daily  . Drug Use: No  . Sexually Active: None   Other Topics Concern  . None   Social History Narrative   Married with children. Pt works as a Financial trader for [employer]    Current outpatient prescriptions:albuterol (PROVENTIL HFA;VENTOLIN HFA) 108 (90 BASE) MCG/ACT inhaler, Inhale 2 puffs into the lungs every 6 (six) hours as needed., Disp: , Rfl: ;  fluticasone (FLONASE) 50 MCG/ACT nasal spray, Place 2 sprays into the nose daily., Disp: 48 g, Rfl: 3;  Fluticasone-Salmeterol (ADVAIR DISKUS) 250-50 MCG/DOSE AEPB, Inhale 1 puff into the lungs 2 (two) times daily., Disp: 180 each, Rfl: 1 terbinafine (LAMISIL) 250 MG tablet, Take 250 mg by mouth See admin instructions. Take 1 tablet daily for 7 days every month, Disp: , Rfl: ;  tiotropium (SPIRIVA HANDIHALER) 18 MCG inhalation capsule, Place 1 capsule (18 mcg total) into inhaler and inhale daily., Disp: 90 capsule, Rfl: 1;  amoxicillin-clavulanate (AUGMENTIN) 875-125 MG per tablet, Take 1 tablet by mouth 2 (two) times daily., Disp: 14 tablet, Rfl: 0  EXAM:  Filed Vitals:   07/10/12 1118  BP: 120/70  Pulse: 95  Temp: 97.3 F (36.3 C)    There is no height on file to calculate BMI.  GENERAL: vitals reviewed and listed above, alert, oriented, appears well hydrated and in no acute distress  HEENT: atraumatic, conjunttiva  clear, no obvious abnormalities on inspection of external nose and ears  NECK: no obvious masses on inspection  SKIN: erythema of skin bilateral feet which patient reports is chronic, mild changes to bilateral skin LEs c/w mild venous stasis dermatitis, no swelling, good pulses and cap refill, no redness to skin elsewhere  MS: moves all extremities without noticeable abnormality, no bony TTP of foot or ankle, no swelling  PSYCH: pleasant and cooperative, no obvious depression or anxiety  ASSESSMENT AND PLAN:  Discussed the following assessment and plan:  1. Tingling, swelling of L foot - resolved  2. Sinus congestion   3. ? Drug reaction    -symptoms resolving, no abnormalities on exam other then bilar redness to feet which pt reports is chronic and unchanged.  No swelling or warmth or skin rash. Normal pulses. No ttp. Unsure of etiology - possibly gout? Discussed other potential causes of foot pain and swelling including infection, thrombosis, etc - but less likely given no current findings on exam to suggest this. Advised if recurs to see physcian immediately. -sinus symptoms resolved. Advised to hold abx in rare chance this was a drug reaction. Follow up if sinus symptoms recur. -Patient advised to return or notify a doctor immediately if symptoms worsen or persist or new concerns arise.  Patient Instructions  -if recurs please see a physician immediately  -if sinus trouble recurs see a doctor     KIM, Dahlia Client R.

## 2012-07-10 NOTE — Patient Instructions (Addendum)
-  if recurs please see a physician immediately  -if sinus trouble recurs see a doctor

## 2012-07-13 ENCOUNTER — Telehealth: Payer: Self-pay | Admitting: Pulmonary Disease

## 2012-07-13 MED ORDER — FLUTICASONE-SALMETEROL 250-50 MCG/DOSE IN AEPB: 1.0000 | INHALATION_SPRAY | Freq: Two times a day (BID) | RESPIRATORY_TRACT | Status: DC

## 2012-07-13 NOTE — Telephone Encounter (Signed)
I have re-sent the prescription to [facility]. I advised the pt wife that we would leave samples up front to last until they get the shipment in.

## 2012-07-14 ENCOUNTER — Telehealth: Payer: Self-pay | Admitting: Internal Medicine

## 2012-07-14 MED ORDER — FLUTICASONE-SALMETEROL 250-50 MCG/DOSE IN AEPB: 1.0000 | INHALATION_SPRAY | Freq: Two times a day (BID) | RESPIRATORY_TRACT | Status: DC

## 2012-07-14 NOTE — Telephone Encounter (Signed)
I spoke with the pt wife and she states they have been trying to get a refill on Advair since December but they called [facility] and it has not been received. According to [facility] rx was sent yesterday [facility]. Samples were left at front for the pt, I advised the wife of this and apologized for delay in refill. I advised I have sent in the prescription for advair to [facility] as we speak. She states understanding.

## 2012-07-18 NOTE — Assessment & Plan Note (Signed)
I pointed out role of cigarette smoking in airway irritation making infections worse and slower to clear. He has no interest in stopping.

## 2012-07-18 NOTE — Assessment & Plan Note (Signed)
Acute visit for recent onset upper respiratory infection with bronchitis syndrome. I encouraged smoking cessation and recommended increased fluids, Mucinex and Augmentin

## 2012-08-08 ENCOUNTER — Encounter: Payer: Self-pay | Admitting: Family Medicine

## 2012-08-08 ENCOUNTER — Ambulatory Visit (INDEPENDENT_AMBULATORY_CARE_PROVIDER_SITE_OTHER): Payer: 59 | Admitting: Family Medicine

## 2012-08-08 VITALS — BP 150/80 | HR 100 | Temp 100.0°F | Wt 133.0 lb

## 2012-08-08 DIAGNOSIS — R05 Cough: Secondary | ICD-10-CM

## 2012-08-08 DIAGNOSIS — R059 Cough, unspecified: Secondary | ICD-10-CM

## 2012-08-08 DIAGNOSIS — J449 Chronic obstructive pulmonary disease, unspecified: Secondary | ICD-10-CM

## 2012-08-08 DIAGNOSIS — J069 Acute upper respiratory infection, unspecified: Secondary | ICD-10-CM

## 2012-08-08 DIAGNOSIS — R509 Fever, unspecified: Secondary | ICD-10-CM

## 2012-08-08 MED ORDER — DOXYCYCLINE HYCLATE 100 MG PO TABS: 100.0000 mg | ORAL_TABLET | Freq: Two times a day (BID) | ORAL | Status: DC

## 2012-08-08 MED ORDER — PREDNISONE 20 MG PO TABS: 40.0000 mg | ORAL_TABLET | Freq: Every day | ORAL | Status: DC

## 2012-08-08 NOTE — Patient Instructions (Addendum)
-  take antibiotic and steroid as instructed  -drink plenty of fluids  -use albuterol as needed  -if worsening symptoms see your doctor, if struggling to breath or feeling very sick go to the emergency room  -follow up with your doctor next available to recheck blood pressure and for follow up

## 2012-08-08 NOTE — Progress Notes (Signed)
Chief Complaint  Patient presents with  . URI    Cough, congestion, fever and SOB since yesterday     HPI:  Urgent Care visit for cough and fever: -started yesterday -symptoms: nasal congestion, cough - non-productive, sore throat, fever 101 last night, SOB, HA, back aches -denies: NVD, dizziness -no known sick contacts, no flu exposure -had flu shot -hx of COPD -takes advair and spiriva for his COPD, has not used his albuterol  ROS: See pertinent positives and negatives per HPI.  Past Medical History  Diagnosis Date  . Onychomycosis     Dr. Melvyn Novas  . Chest x-ray abnormality   . Weight loss   . COPD (chronic obstructive pulmonary disease)     PFT's rec Jul 17, 2009  . Chronic rhinitis     -Sinus Ct 08/01/2009 >> Bilateral maxillary sinusitis with some mucosal thickeningin the sphenoid and frontal sinuses as well with air fluid levels present -chronic rhinitis flyer Aug 04, 2009    Family History  Problem Relation Age of Onset  . Heart disease Mother     CABG in her 60s  . Cancer Mother   . Stroke Father   . Heart disease Father     Died of MI at age 3  . Hepatitis Sister   . Coronary artery disease Other     male 1st degree relative <60    History   Social History  . Marital Status: Married    Spouse Name: N/A    Number of Children: 2  . Years of Education: N/A   Occupational History  .     Social History Main Topics  . Smoking status: Current Some Day Smoker -- 2.0 packs/day for 10 years    Types: Pipe    Last Attempt to Quit: 07/09/1975  . Smokeless tobacco: None     Comment: Started smoking cigs at age 71. Smoked 2ppd x 10 years. Now, pt states he smokes a pipe "all day long" x 25 years.   . Alcohol Use: 1.8 oz/week    3 Cans of beer per week     Comment: daily  . Drug Use: No  . Sexually Active: None   Other Topics Concern  . None   Social History Narrative   Married with children. Pt works as a Veterinary surgeon for [employer]     Current outpatient prescriptions:albuterol (PROVENTIL HFA;VENTOLIN HFA) 108 (90 BASE) MCG/ACT inhaler, Inhale 2 puffs into the lungs every 6 (six) hours as needed., Disp: , Rfl: ;  fluticasone (FLONASE) 50 MCG/ACT nasal spray, Place 2 sprays into the nose daily., Disp: 48 g, Rfl: 3;  Fluticasone-Salmeterol (ADVAIR DISKUS) 250-50 MCG/DOSE AEPB, Inhale 1 puff into the lungs 2 (two) times daily., Disp: 180 each, Rfl: 1 terbinafine (LAMISIL) 250 MG tablet, Take 250 mg by mouth See admin instructions. Take 1 tablet daily for 7 days every month, Disp: , Rfl: ;  tiotropium (SPIRIVA HANDIHALER) 18 MCG inhalation capsule, Place 1 capsule (18 mcg total) into inhaler and inhale daily., Disp: 90 capsule, Rfl: 1;  amoxicillin-clavulanate (AUGMENTIN) 875-125 MG per tablet, Take 1 tablet by mouth 2 (two) times daily., Disp: 14 tablet, Rfl: 0 doxycycline (VIBRA-TABS) 100 MG tablet, Take 1 tablet (100 mg total) by mouth 2 (two) times daily., Disp: 20 tablet, Rfl: 0;  predniSONE (DELTASONE) 20 MG tablet, Take 2 tablets (40 mg total) by mouth daily., Disp: 10 tablet, Rfl: 0  EXAM:  Filed Vitals:   08/08/12 1128  BP: 150/80  Pulse: 100  Temp: 100 F (37.8 C)   O2 sats 95% There is no height on file to calculate BMI.  GENERAL: vitals reviewed and listed above, alert, oriented, appears well hydrated and in no acute distress  HEENT: atraumatic, conjunttiva clear, no obvious abnormalities on inspection of external nose and ears  NECK: no obvious masses on inspection  LUNGS: clear to auscultation bilaterally, no wheezes, rales or rhonchi, good air movement  CV: HRRR, no peripheral edema  MS: moves all extremities without noticeable abnormality  PSYCH: pleasant and cooperative, no obvious depression or anxiety  ASSESSMENT AND PLAN:  Discussed the following assessment and plan:  1. Fever  POCT Influenza A/B  2. Cough  predniSONE (DELTASONE) 20 MG tablet, doxycycline (VIBRA-TABS) 100 MG tablet  3. COPD  (chronic obstructive pulmonary disease)  predniSONE (DELTASONE) 20 MG tablet, doxycycline (VIBRA-TABS) 100 MG tablet  4. Upper respiratory infection  doxycycline (VIBRA-TABS) 100 MG tablet   -had flu shot, neg rapid flu - discussed still sm potential for the flu, but pt appears well and afebrile so less likely - discussed risks/benefits tamiflu and pt deferred -lung exam without rhonchi or sig wheezing, O2 sats above baseline -will tx with prednisone and and doxy - discussed risks -Patient advised to return or notify a doctor immediately if symptoms worsen or persist or new concerns arise.  Patient Instructions  -take antibiotic and steroid as instructed  -drink plenty of fluids  -use albuterol as needed  -if worsening symptoms see your doctor, if struggling to breath or feeling very sick go to the emergency room     Railyn House, [facility] R.

## 2012-09-21 ENCOUNTER — Other Ambulatory Visit: Payer: Self-pay | Admitting: *Deleted

## 2012-09-21 MED ORDER — TIOTROPIUM BROMIDE MONOHYDRATE 18 MCG IN CAPS: 18.0000 ug | ORAL_CAPSULE | Freq: Every day | RESPIRATORY_TRACT | Status: DC

## 2012-09-28 ENCOUNTER — Other Ambulatory Visit: Payer: Self-pay | Admitting: Pulmonary Disease

## 2012-09-28 MED ORDER — FLUTICASONE-SALMETEROL 250-50 MCG/DOSE IN AEPB: 1.0000 | INHALATION_SPRAY | Freq: Two times a day (BID) | RESPIRATORY_TRACT | Status: DC

## 2012-10-07 ENCOUNTER — Other Ambulatory Visit: Payer: Self-pay | Admitting: Dermatology

## 2012-10-26 ENCOUNTER — Encounter: Payer: Self-pay | Admitting: *Deleted

## 2012-10-26 ENCOUNTER — Other Ambulatory Visit: Payer: Self-pay | Admitting: Pulmonary Disease

## 2012-10-28 ENCOUNTER — Ambulatory Visit (INDEPENDENT_AMBULATORY_CARE_PROVIDER_SITE_OTHER): Payer: 59 | Admitting: Internal Medicine

## 2012-10-28 ENCOUNTER — Encounter: Payer: Self-pay | Admitting: Internal Medicine

## 2012-10-28 VITALS — BP 124/80 | HR 84 | Temp 97.0°F | Ht 73.0 in | Wt 131.4 lb

## 2012-10-28 DIAGNOSIS — J310 Chronic rhinitis: Secondary | ICD-10-CM

## 2012-10-28 DIAGNOSIS — J4489 Other specified chronic obstructive pulmonary disease: Secondary | ICD-10-CM

## 2012-10-28 DIAGNOSIS — F17200 Nicotine dependence, unspecified, uncomplicated: Secondary | ICD-10-CM

## 2012-10-28 DIAGNOSIS — J449 Chronic obstructive pulmonary disease, unspecified: Secondary | ICD-10-CM

## 2012-10-28 MED ORDER — PREDNISONE (PAK) 10 MG PO TABS: ORAL_TABLET | ORAL | Status: DC

## 2012-10-28 MED ORDER — AMOXICILLIN-POT CLAVULANATE 875-125 MG PO TABS: 1.0000 | ORAL_TABLET | Freq: Two times a day (BID) | ORAL | Status: DC

## 2012-10-28 NOTE — Progress Notes (Signed)
Subjective:    Patient ID: Francisco [name], male    DOB: [birth year], 67 y.o.   MRN: [id number]  Brief patient profile:  67 yowm pipe smoker with copd f/u by Clance with chronic rinitis/ sinusitis year round transiently with antibiotics   10/28/2012 acute  ov/[name] re sinus problems Chief Complaint  Patient presents with  . Acute Visit    Pt c/o having dizziness x 1 wk- worse since this am. He states that it feels like there is fluid in both ears. He also states increase in cough for the past couple wks, prod with minimal green sputum.    already on flonase and denies nasal obst but constant sense of fluid in hears and congested cough with min green sputum chronically, esp in am, with mild positional vertigo x 1 week no other viz or neuro changes or nausea . Denies use of prns otc.  No need for saba above baseline of a few times a week when he "overdoes it"  Sleeping ok without nocturnal  or early am exacerbation  of respiratory  c/o's or need for noct saba. Also denies any obvious fluctuation of symptoms with weather or environmental changes or other aggravating or alleviating factors except as outlined above   Current Medications, Allergies, Past Medical History, Past Surgical History, Family History, and Social History were reviewed in [employer] record.  ROS  The following are not active complaints unless bolded sore throat, dysphagia, dental problems, itching, sneezing,  nasal congestion or excess/ purulent secretions, ear ache,   fever, chills, sweats, unintended wt loss, pleuritic or exertional cp, hemoptysis,  orthopnea pnd or leg swelling, presyncope, palpitations, heartburn, abdominal pain, anorexia, nausea, vomiting, diarrhea  or change in bowel or urinary habits, change in stools or urine, dysuria,hematuria,  rash, arthralgias, visual complaints, headache, numbness weakness or ataxia or problems with walking or coordination,  change in mood/affect or memory.       Objective:   Physical Exam OBJ- Physical Exam General- Alert, Oriented, Affect-appropriate, Distress- none acute Skin- rash-none, lesions- none, excoriation- none Lymphadenopathy- none Head- atraumatic            Mouth - thrush             Eyes- Gross vision intact, PERRLA, conjunctivae and secretions clear            Ears- Hearing, canals-wax blocking bilaterally            Nose- Clear, no-Septal dev, mucus, polyps, erosion, perforation             Throat- Mallampati II , mucosa clear , drainage- none, tonsils- atrophic Neck- flexible , trachea midline, no stridor , thyroid nl, carotid no bruit Chest - symmetrical excursion , unlabored           Heart/CV- RRR , no murmur , no gallop  , no rub, nl s1 s2                           - JVD- none , edema- none, stasis changes- none, varices- none           Lung- + decreased breath sounds with slight wheeze and scattered rhonchi, unlabored , dullness-none, rub- none           Chest wall-  Abd-  Br/ Gen/ Rectal- Not done, not indicated Extrem- cyanosis- none, clubbing, none, atrophy- none, strength- nl Neuro- grossly intact to observation    Assessment &  Plan:

## 2012-10-28 NOTE — Assessment & Plan Note (Addendum)
PFT's 09/2009:  FEV1 1.20 (34%), ratio 26, increase 17% with BD, +++airtrapping, DLCO 72%  - rel well compensated on spriva and advair though concerned about the thrush ? From advair and the thick secretions from the spiriva but noted he's still smoking which is the greated "adverse effect" possible the other two issues are minor by comparison  Rec mucuinex dm, treat rhintis/sinusitis (discussed separately) and f/u with Clance to regroup re chonic rx    Each maintenance medication was reviewed in detail including most importantly the difference between maintenance and as needed and under what circumstances the prns are to be used.  Please see instructions for details which were reviewed in writing and the patient given a copy.

## 2012-10-28 NOTE — Assessment & Plan Note (Signed)
CT sinus 08/01/09  Bilateral maxillary sinusitis with some mucosal thickening in the  sphenoid and frontal sinuses as well.   Main risk factor is ongoing swelling > rx augmentin and pred x 6 days then f/u with [facility], continue flonase

## 2012-10-28 NOTE — Assessment & Plan Note (Signed)
>   3 min discussion  I took an extended  opportunity with this patient to outline the consequences of continued cigarette use  in airway disorders based on all the data we have from the multiple national lung health studies (perfomed over decades at millions of dollars in cost)  indicating that smoking cessation, not choice of inhalers or physicians, is the most important aspect of care.    He did not seem interested at all in committing to quit at this point

## 2012-10-28 NOTE — Patient Instructions (Addendum)
augmentin 875 twice daily x 10 days  Prednisone 10 mg take  4 each am x 2 days,   2 each am x 2 days,  1 each am x2days and stop  For cough and congestion recommend mucinex or mucinex dm up to 1200 mg every 12 hours  Stop  Pipe smoking  as much as possible  Follow up with Dr Lazarus Salines   See Clance in 4-6 weeks, sooner if needed for breathing or coughing.

## 2012-11-20 ENCOUNTER — Other Ambulatory Visit: Payer: Self-pay | Admitting: Pulmonary Disease

## 2012-11-20 MED ORDER — TIOTROPIUM BROMIDE MONOHYDRATE 18 MCG IN CAPS: 18.0000 ug | ORAL_CAPSULE | Freq: Every day | RESPIRATORY_TRACT | Status: DC

## 2012-11-25 ENCOUNTER — Ambulatory Visit (INDEPENDENT_AMBULATORY_CARE_PROVIDER_SITE_OTHER): Payer: 59 | Admitting: Pulmonary Disease

## 2012-11-25 ENCOUNTER — Encounter: Payer: Self-pay | Admitting: Pulmonary Disease

## 2012-11-25 VITALS — BP 102/62 | HR 88 | Temp 97.7°F | Ht 70.25 in | Wt 131.8 lb

## 2012-11-25 DIAGNOSIS — J439 Emphysema, unspecified: Secondary | ICD-10-CM

## 2012-11-25 DIAGNOSIS — J438 Other emphysema: Secondary | ICD-10-CM

## 2012-11-25 MED ORDER — TIOTROPIUM BROMIDE MONOHYDRATE 18 MCG IN CAPS: 18.0000 ug | ORAL_CAPSULE | Freq: Every day | RESPIRATORY_TRACT | Status: DC

## 2012-11-25 MED ORDER — MONTELUKAST SODIUM 10 MG PO TABS: 10.0000 mg | ORAL_TABLET | Freq: Every day | ORAL | Status: DC

## 2012-11-25 NOTE — Progress Notes (Signed)
  Subjective:    Patient ID: Francisco [name], male    DOB: [birth year], 67 y.o.   MRN: [id number]  HPI Patient comes in today for followup of his severe COPD, felt secondary to both emphysema and asthma.  He also has underlying chronic rhinitis with significant allergies, and also recurrent sinusitis.  He has been staying on his bronchodilator regimen, but feels that his exertional tolerance has slowly worsened over time.  He continues to smoke a pipe despite encouragement to discontinue.  He denies any issues consistent with sinusitis, but is having a lot of allergy issues.  He feels this leads to increasing cough.   Review of Systems  Constitutional: Negative for fever and unexpected weight change.  HENT: Positive for congestion, rhinorrhea, sneezing, postnasal drip and sinus pressure. Negative for ear pain, nosebleeds, sore throat, trouble swallowing and dental problem.   Eyes: Negative for redness and itching.  Respiratory: Positive for cough, shortness of breath and wheezing. Negative for chest tightness.   Cardiovascular: Negative for palpitations and leg swelling.  Gastrointestinal: Negative for nausea and vomiting.  Genitourinary: Negative for dysuria.  Musculoskeletal: Negative for joint swelling.  Skin: Negative for rash.  Neurological: Positive for headaches.  Hematological: Does not bruise/bleed easily.  Psychiatric/Behavioral: Negative for dysphoric mood. The patient is not nervous/anxious.        Objective:   Physical Exam Thin male in no acute distress Nose without purulent discharge noted Oropharynx clear Neck without lymphadenopathy or thyromegaly Chest with very diminished breath sounds throughout, no active wheezing, occasional rhonchi Cardiac exam with regular rate and rhythm Lower extremities without edema, no cyanosis Alert and oriented, moves all 4 extremities.       Assessment & Plan:

## 2012-11-25 NOTE — Assessment & Plan Note (Signed)
The patient has severe COPD, and has had slowly progressive worsening over the last one-year.  He is currently having increased symptoms secondary to allergies, but which is leading to increased shortness of breath.  He does not feel that he is having a sinus infection currently.  At this point, we'll try him on Singulair for his allergies, but we may ultimately have to have given him a short course of prednisone.  He will let us know if he is worsening despite Singulair.

## 2012-11-25 NOTE — Patient Instructions (Addendum)
Will try you on singulair 10mg  one each day for your allergies Continue your current pulmonary medications. If you continue to have issues, please call us and we can send in a prescription for prednisone pulse. followup with me in 4-74mos, but let us know how things go with singulair.

## 2013-01-27 ENCOUNTER — Other Ambulatory Visit: Payer: Self-pay | Admitting: Pulmonary Disease

## 2013-02-09 ENCOUNTER — Other Ambulatory Visit (INDEPENDENT_AMBULATORY_CARE_PROVIDER_SITE_OTHER): Payer: 59

## 2013-02-09 DIAGNOSIS — Z0000 Encounter for general adult medical examination without abnormal findings: Secondary | ICD-10-CM

## 2013-02-09 DIAGNOSIS — R7989 Other specified abnormal findings of blood chemistry: Secondary | ICD-10-CM

## 2013-02-09 LAB — CBC WITH DIFFERENTIAL/PLATELET
Basophils Relative: 0.3 % (ref 0.0–3.0)
Eosinophils Absolute: 0.1 10*3/uL (ref 0.0–0.7)
MCHC: 33.7 g/dL (ref 30.0–36.0)
MCV: 98.3 fl (ref 78.0–100.0)
Monocytes Absolute: 0.8 10*3/uL (ref 0.1–1.0)
Neutrophils Relative %: 68.4 % (ref 43.0–77.0)
RBC: 4.67 Mil/uL (ref 4.22–5.81)

## 2013-02-09 LAB — BASIC METABOLIC PANEL
BUN: 12 mg/dL (ref 6–23)
CO2: 29 mEq/L (ref 19–32)
Chloride: 101 mEq/L (ref 96–112)
Creatinine, Ser: 0.8 mg/dL (ref 0.4–1.5)

## 2013-02-09 LAB — HEPATIC FUNCTION PANEL
Albumin: 4.3 g/dL (ref 3.5–5.2)
Bilirubin, Direct: 0.1 mg/dL (ref 0.0–0.3)
Total Protein: 7.5 g/dL (ref 6.0–8.3)

## 2013-02-09 LAB — POCT URINALYSIS DIPSTICK
Bilirubin, UA: NEGATIVE
Glucose, UA: NEGATIVE
Leukocytes, UA: NEGATIVE
Nitrite, UA: NEGATIVE

## 2013-02-09 LAB — LIPID PANEL
Cholesterol: 223 mg/dL — ABNORMAL HIGH (ref 0–200)
HDL: 105.1 mg/dL (ref >39.00)
Triglycerides: 48 mg/dL (ref 0.0–149.0)

## 2013-02-09 LAB — PSA: PSA: 2.42 ng/mL (ref 0.10–4.00)

## 2013-02-10 ENCOUNTER — Other Ambulatory Visit: Payer: Self-pay

## 2013-02-19 ENCOUNTER — Encounter: Payer: Self-pay | Admitting: Internal Medicine

## 2013-02-19 ENCOUNTER — Ambulatory Visit (INDEPENDENT_AMBULATORY_CARE_PROVIDER_SITE_OTHER): Payer: 59 | Admitting: Internal Medicine

## 2013-02-19 VITALS — BP 122/74 | HR 76 | Temp 97.9°F | Ht 71.0 in | Wt 133.0 lb

## 2013-02-19 DIAGNOSIS — Z0000 Encounter for general adult medical examination without abnormal findings: Secondary | ICD-10-CM

## 2013-02-19 NOTE — Progress Notes (Signed)
CPX  Reviewed pmh, psh, sochx Reviewed meds  ROS:  patient denies chest pain, shortness of breath, orthopnea. Denies lower extremity edema, abdominal pain, change in appetite, change in bowel movements. Patient denies rashes, musculoskeletal complaints. No other specific complaints in a complete review of systems.   Exam: reviewed vitals  well-developed well-nourished male appears older than stated age.. in no acute distress. HEENT exam atraumatic, normocephalic, neck supple without jugular venous distention. Chest clear to auscultation cardiac exam S1-S2 are regular. Abdominal exam overweight with bowel sounds, soft and nontender. Extremities no edema. Neurologic exam is alert with a normal gait.  Well Visit: health maint utd Quit smoking

## 2013-03-11 ENCOUNTER — Ambulatory Visit (INDEPENDENT_AMBULATORY_CARE_PROVIDER_SITE_OTHER): Payer: 59 | Admitting: Family Medicine

## 2013-03-11 ENCOUNTER — Encounter: Payer: Self-pay | Admitting: Family Medicine

## 2013-03-11 VITALS — BP 120/82 | Temp 98.3°F | Wt 133.0 lb

## 2013-03-11 DIAGNOSIS — K1370 Unspecified lesions of oral mucosa: Secondary | ICD-10-CM

## 2013-03-11 DIAGNOSIS — K121 Other forms of stomatitis: Secondary | ICD-10-CM

## 2013-03-11 MED ORDER — MAGIC MOUTHWASH W/LIDOCAINE: 2.0000 mL | Freq: Three times a day (TID) | ORAL | Status: DC | PRN

## 2013-03-11 MED ORDER — VALACYCLOVIR HCL 1 G PO TABS: 1000.0000 mg | ORAL_TABLET | Freq: Two times a day (BID) | ORAL | Status: DC

## 2013-03-11 NOTE — Patient Instructions (Addendum)
-  As we discussed, we have prescribed a new medication (valtrex) for you at this appointment. We discussed the common and serious potential adverse effects of this medication and you can review these and more with the pharmacist when you pick up your medication.  Please follow the instructions for use carefully and notify us immediately if you have any problems taking this medication.  -use mouthwash as instructed  -hydrogen peroxide swish twice daily  -see a doctor if worsening, fever, new symptoms or concerns or persists

## 2013-03-11 NOTE — Progress Notes (Signed)
Chief Complaint  Patient presents with  . blisters    inside lip and gettng raw; had a few weeks ago and got better now back     HPI:  Francisco [name], a 67 yo M patient of Dr. Cato Mulligan, is here for an acute visit for a sore mouth: -has mouth sores from time to time - take magic mouth wash -had a few weeks ago and resolved -recurred yesterday -painful ulcers in mouth -has had some chronic sinus issues, PND, sore throat - on medications for this -denies: fevers, malaise   ROS: See pertinent positives and negatives per HPI.  Past Medical History  Diagnosis Date  . Onychomycosis     Dr. Melvyn Novas  . Chest x-ray abnormality   . Weight loss   . COPD (chronic obstructive pulmonary disease)     PFT's rec Jul 17, 2009  . Chronic rhinitis     -Sinus Ct 08/01/2009 >> Bilateral maxillary sinusitis with some mucosal thickeningin the sphenoid and frontal sinuses as well with air fluid levels present -chronic rhinitis flyer Aug 04, 2009    Past Surgical History  Procedure Laterality Date  . Appendectomy    . Tonsillectomy      Family History  Problem Relation Age of Onset  . Heart disease Mother     CABG in her 22s  . Cancer Mother   . Stroke Father   . Heart disease Father     Died of MI at age 52  . Hepatitis Sister   . Coronary artery disease Other     male 1st degree relative <60    History   Social History  . Marital Status: Married    Spouse Name: N/A    Number of Children: 2  . Years of Education: N/A   Occupational History  .     Social History Main Topics  . Smoking status: Current Every Day Smoker -- 2.00 packs/day for 10 years    Types: Pipe    Last Attempt to Quit: 07/09/1975  . Smokeless tobacco: None     Comment: Started smoking cigs at age 89. Smoked 2ppd x 10 years. Now, pt states he smokes a pipe "all day long" x 25 years.   . Alcohol Use: 1.8 oz/week    3 Cans of beer per week     Comment: daily  . Drug Use: No  . Sexual Activity: None   Other  Topics Concern  . None   Social History Narrative   Married with children. Pt works as a Veterinary surgeon for [employer]    Current outpatient prescriptions:fluticasone (FLONASE) 50 MCG/ACT nasal spray, Place 2 sprays into the nose daily., Disp: 48 g, Rfl: 3;  Fluticasone-Salmeterol (ADVAIR DISKUS) 250-50 MCG/DOSE AEPB, Inhale 1 puff into the lungs 2 (two) times daily., Disp: 180 each, Rfl: 2;  montelukast (SINGULAIR) 10 MG tablet, Take 1 tablet (10 mg total) by mouth at bedtime., Disp: 30 tablet, Rfl: 11 SPIRIVA HANDIHALER 18 MCG inhalation capsule, Inhale the contents of 1  capsule via HandiHaler  daily, Disp: 90 capsule, Rfl: 0;  terbinafine (LAMISIL) 250 MG tablet, Take 250 mg by mouth See admin instructions. Take 1 tablet daily for 7 days every month, Disp: , Rfl: ;  VENTOLIN HFA 108 (90 BASE) MCG/ACT inhaler, INHALE 2 PUFFS INTO THE LUNGS EVERY 6 HOURS AS NEEDED FOR WHEEZING, Disp: 1 Inhaler, Rfl: 0 Alum & Mag Hydroxide-Simeth (MAGIC MOUTHWASH W/LIDOCAINE) SOLN, Take 2 mLs by mouth 3 (three) times  daily as needed. Swish and spit, Disp: 30 mL, Rfl: 0;  valACYclovir (VALTREX) 1000 MG tablet, Take 1 tablet (1,000 mg total) by mouth 2 (two) times daily., Disp: 2 tablet, Rfl: 0  EXAM:  Filed Vitals:   03/11/13 1347  BP: 120/82  Temp: 98.3 F (36.8 C)    Body mass index is 18.56 kg/(m^2).  GENERAL: vitals reviewed and listed above, alert, oriented, appears well hydrated and in no acute distress  HEENT: atraumatic, conjunttiva clear, no obvious abnormalities on inspection of external nose and ears, normal appearance of ear canals and TMs, clear nasal congestion, mild post oropharyngeal erythema with PND, no tonsillar edema or exudate, few very small scattered oral ulcers, no thrush, no sinus TTP  NECK: no obvious masses on inspection  LUNGS: clear to auscultation bilaterally, no wheezes, rales or rhonchi, good air movement  CV: HRRR, no peripheral edema  MS: moves all extremities  without noticeable abnormality  PSYCH: pleasant and cooperative, no obvious depression or anxiety  ASSESSMENT AND PLAN:  Discussed the following assessment and plan:  Oral ulcer - Plan: valACYclovir (VALTREX) 1000 MG tablet, Alum & Mag Hydroxide-Simeth (MAGIC MOUTHWASH W/LIDOCAINE) SOLN  -discussed likely viral and options, he is wanting antiviral tx - discussed will tx incase herpes - risks discussed -refilled magic mouth wash -of course he was advised to follow up if worsening, other symptoms or symptoms persist -Patient advised to return or notify a doctor immediately if symptoms worsen or persist or new concerns arise.  Patient Instructions  -As we discussed, we have prescribed a new medication (valtrex) for you at this appointment. We discussed the common and serious potential adverse effects of this medication and you can review these and more with the pharmacist when you pick up your medication.  Please follow the instructions for use carefully and notify us immediately if you have any problems taking this medication.  -use mouthwash as instructed  -hydrogen peroxide swish twice daily  -see a doctor if worsening, fever, new symptoms or concers        Mackenzie Lia R.

## 2013-03-13 ENCOUNTER — Other Ambulatory Visit: Payer: Self-pay | Admitting: Family Medicine

## 2013-03-13 NOTE — Telephone Encounter (Signed)
I don't believe I have prescribed this for him. Forwarding to PCP.

## 2013-04-07 ENCOUNTER — Other Ambulatory Visit: Payer: Self-pay | Admitting: Dermatology

## 2013-04-09 ENCOUNTER — Other Ambulatory Visit: Payer: Self-pay | Admitting: *Deleted

## 2013-04-09 MED ORDER — TIOTROPIUM BROMIDE MONOHYDRATE 18 MCG IN CAPS: ORAL_CAPSULE | RESPIRATORY_TRACT | Status: DC

## 2013-05-11 ENCOUNTER — Other Ambulatory Visit: Payer: Self-pay | Admitting: Pulmonary Disease

## 2013-05-13 ENCOUNTER — Other Ambulatory Visit: Payer: Self-pay

## 2013-05-13 ENCOUNTER — Other Ambulatory Visit: Payer: Self-pay | Admitting: Pulmonary Disease

## 2013-05-28 ENCOUNTER — Encounter: Payer: Self-pay | Admitting: Pulmonary Disease

## 2013-05-28 ENCOUNTER — Ambulatory Visit (INDEPENDENT_AMBULATORY_CARE_PROVIDER_SITE_OTHER): Payer: 59 | Admitting: Pulmonary Disease

## 2013-05-28 VITALS — BP 112/62 | HR 79 | Temp 97.9°F | Ht 72.0 in | Wt 138.0 lb

## 2013-05-28 DIAGNOSIS — J439 Emphysema, unspecified: Secondary | ICD-10-CM

## 2013-05-28 DIAGNOSIS — R911 Solitary pulmonary nodule: Secondary | ICD-10-CM

## 2013-05-28 DIAGNOSIS — J438 Other emphysema: Secondary | ICD-10-CM

## 2013-05-28 NOTE — Assessment & Plan Note (Signed)
The patient is due for one more CT scan in followup currently.

## 2013-05-28 NOTE — Patient Instructions (Signed)
Continue on current medications Work on increased activity and improving caloric intake Will schedule for a ct scan of chest to followup abnormalities as discussed. followup with me in 6mos, but call if you are not doing well.

## 2013-05-28 NOTE — Progress Notes (Signed)
  Subjective:    Patient ID: Francisco [name], male    DOB: [birth year], 67 y.o.   MRN: [id number]  HPI Patient comes in today for followup of his severe COPD.  He is staying compliant on his medications, and notes the addition of Singulair helped his allergies and breathing as well.  I was concerned about the possibility of an asthmatic component to his disease.  His exertional tolerance is a little bit below baseline, and he has had some intermittent wheezing.  However, he is not in any current distress.  He denies any significant congestion, cough, or purulent mucus.   Review of Systems  Constitutional: Negative for fever and unexpected weight change.  HENT: Negative for congestion, dental problem, ear pain, nosebleeds, postnasal drip, rhinorrhea, sinus pressure, sneezing, sore throat and trouble swallowing.        Right ear stuffiness  Eyes: Negative for redness and itching.  Respiratory: Positive for cough, shortness of breath and wheezing. Negative for chest tightness.   Cardiovascular: Negative for palpitations and leg swelling.  Gastrointestinal: Negative for nausea and vomiting.  Genitourinary: Negative for dysuria.  Musculoskeletal: Negative for joint swelling.  Skin: Negative for rash.  Neurological: Negative for headaches.  Hematological: Does not bruise/bleed easily.  Psychiatric/Behavioral: Negative for dysphoric mood. The patient is not nervous/anxious.        Objective:   Physical Exam Thin male in no acute distress Nose without purulence or discharge noted Neck without lymphadenopathy or thyromegaly Chest with very diminished breath sounds, a few scattered wheezes. Cardiac exam with regular rate and rhythm Lower extremities without edema, no cyanosis Alert and oriented, moves all 4 extremities.       Assessment & Plan:

## 2013-05-28 NOTE — Assessment & Plan Note (Signed)
The patient feels that his breathing currently he is below his usual baseline, and he does have a few scattered wheezes on exam.  Because he is not in any current distress nor does he feel he has a chest cold, I would like to give this a few days and see he responds.  If he continues to have issues or worsens, he will need a course of prednisone.  In the meantime, I have stressed to him the importance of increasing his exertional activities and also improving his caloric intake.

## 2013-06-01 ENCOUNTER — Ambulatory Visit (INDEPENDENT_AMBULATORY_CARE_PROVIDER_SITE_OTHER)
Admission: RE | Admit: 2013-06-01 | Discharge: 2013-06-01 | Disposition: A | Discharge status: Home / Self Care | Payer: 59 | Source: Ambulatory Visit | Attending: Pulmonary Disease | Admitting: Pulmonary Disease

## 2013-06-01 ENCOUNTER — Encounter: Payer: Self-pay | Admitting: Internal Medicine

## 2013-06-01 DIAGNOSIS — R911 Solitary pulmonary nodule: Secondary | ICD-10-CM

## 2013-06-01 IMAGING — CT CT CHEST W/O CM
2 of 4 series · 15 of 36 positions shown, 18 images · IV contrast (Omnipaque 300)
Comparison: Chest x-ray [DATE].

CLINICAL DATA: Routine followup for pulmonary nodules.

EXAM:
CT CHEST WITHOUT CONTRAST
TECHNIQUE: Multidetector CT imaging of the chest was performed following the
standard protocol without IV contrast.

[Series 2: chest routine with · axial · 0.71mm/px · z∈[-302,-12]mm · 12 of 70 slices shown, 15 images]
[im 6/70  mediastinal]
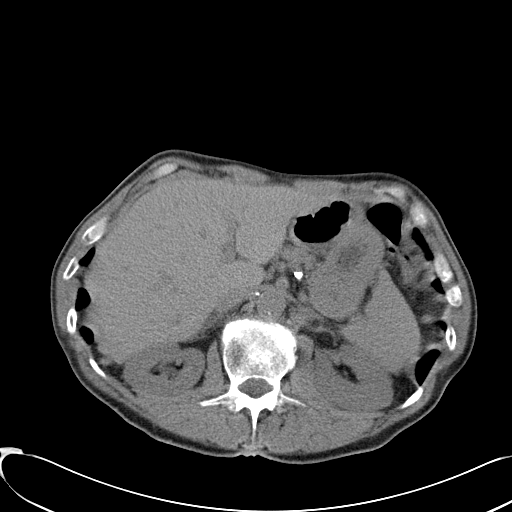
[im 6/70  lung]
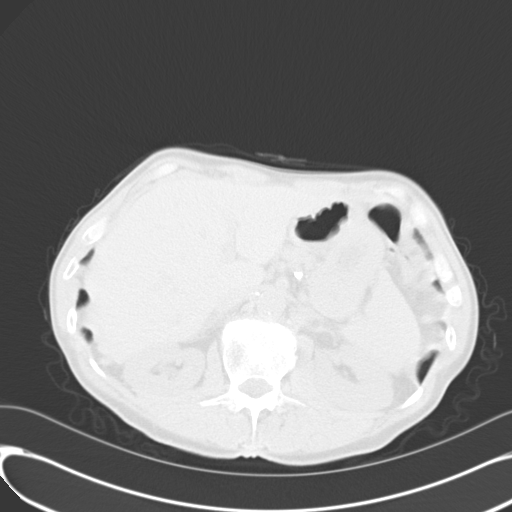
[im 11/70  lung]
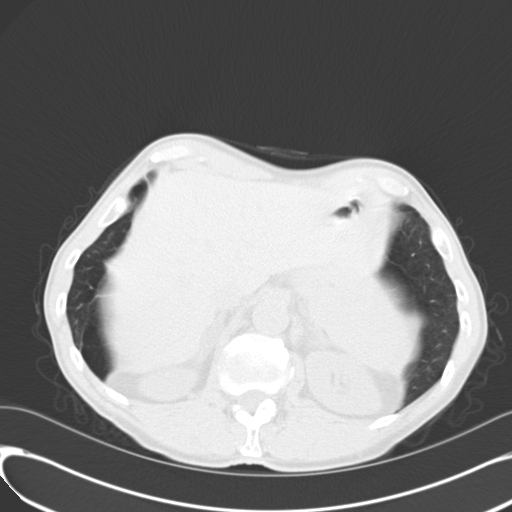
[im 16/70  lung]
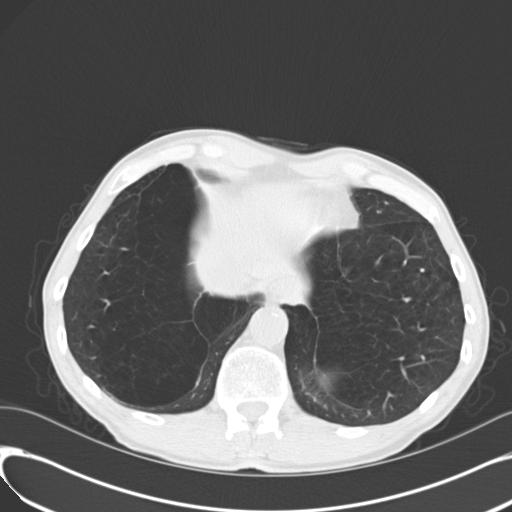
[im 22/70  lung]
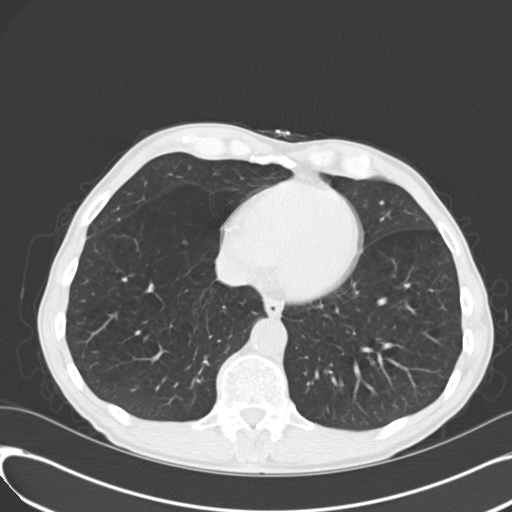
[im 27/70  mediastinal]
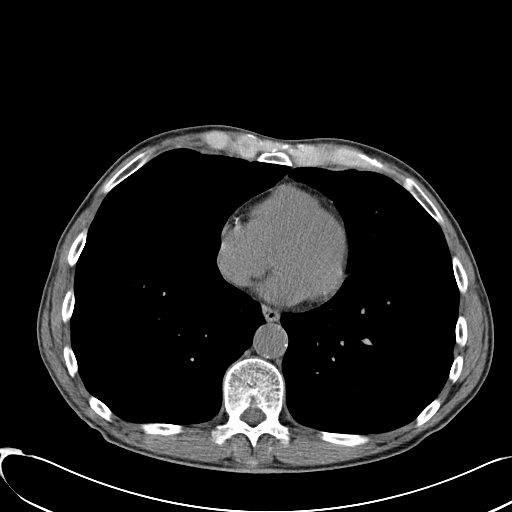
[im 27/70  lung]
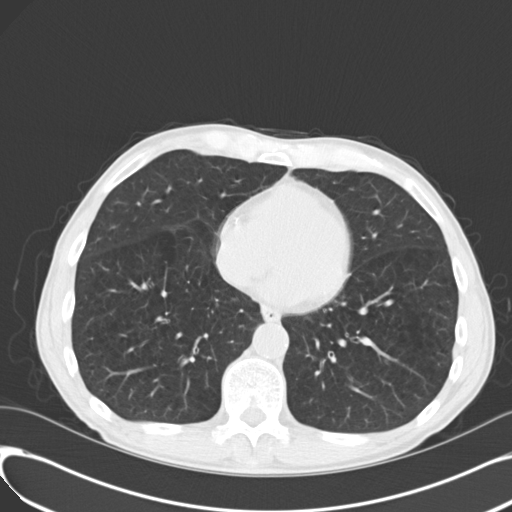
[im 32/70  lung]
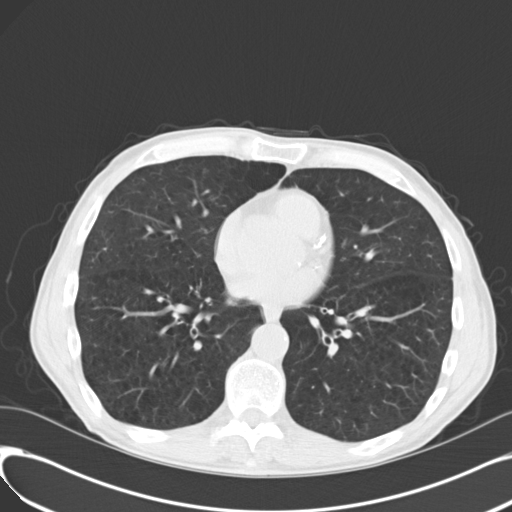
[im 38/70  lung]
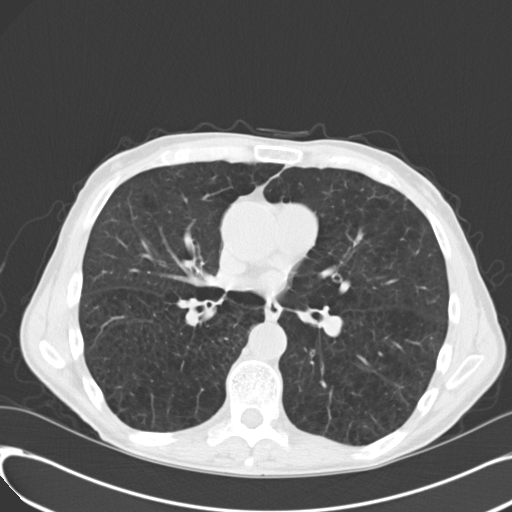
[im 43/70  lung]
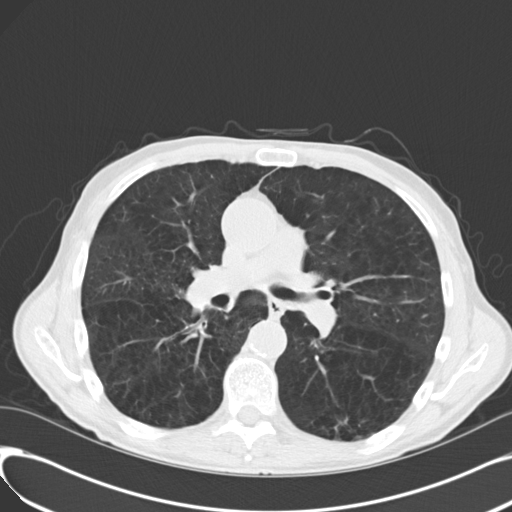
[im 48/70  mediastinal]
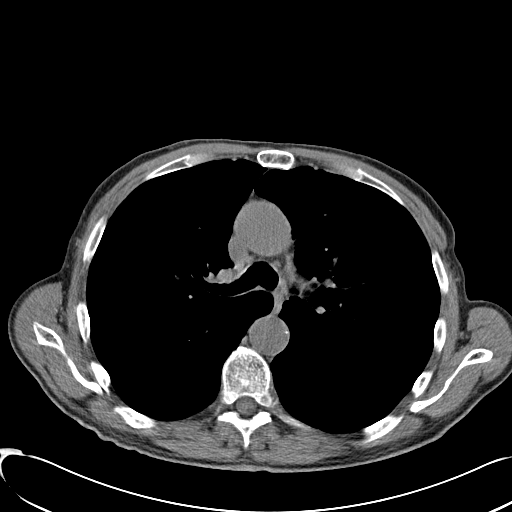
[im 48/70  lung]
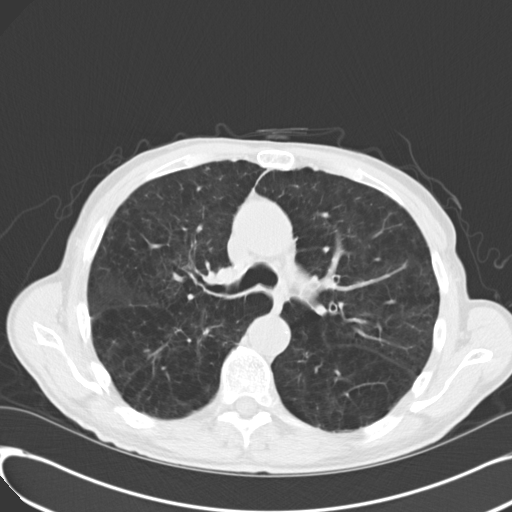
[im 54/70  lung]
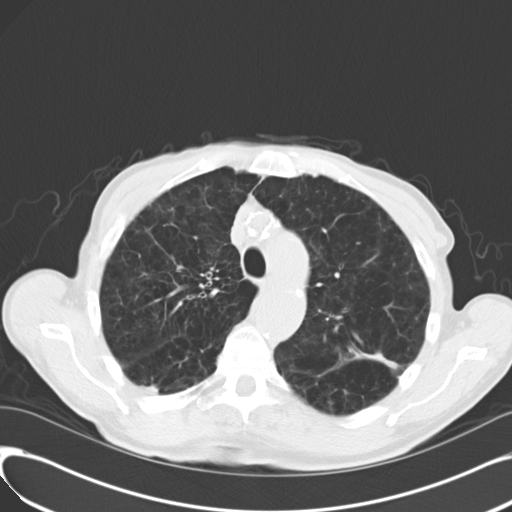
[im 59/70  lung]
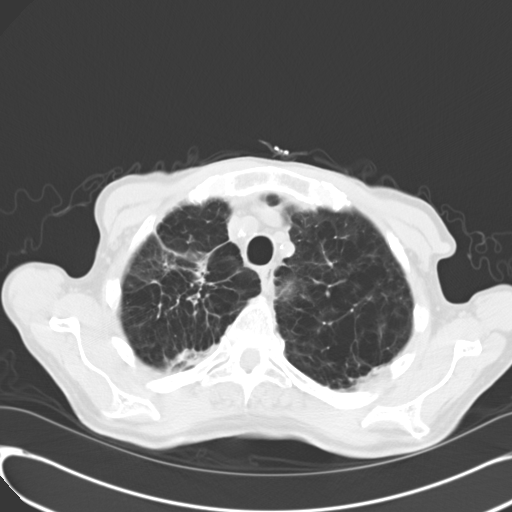
[im 64/70  lung]
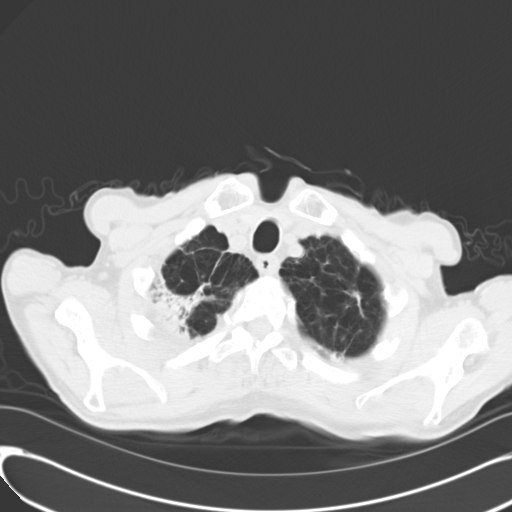

[Series 602: cor · coronal · 0.71mm/px · 3 of 114 slices shown]
[im 23/114  lung]
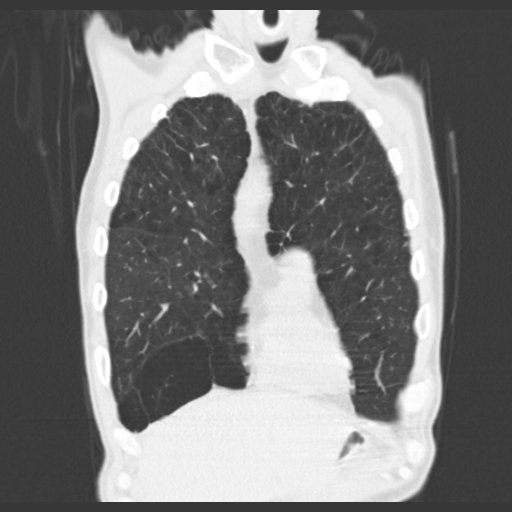
[im 46/114  lung]
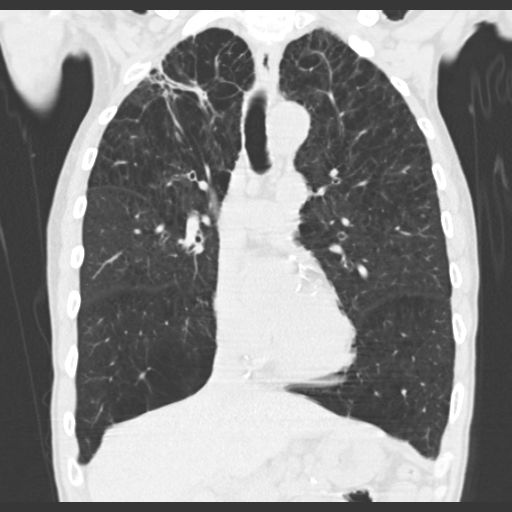
[im 68/114  lung]
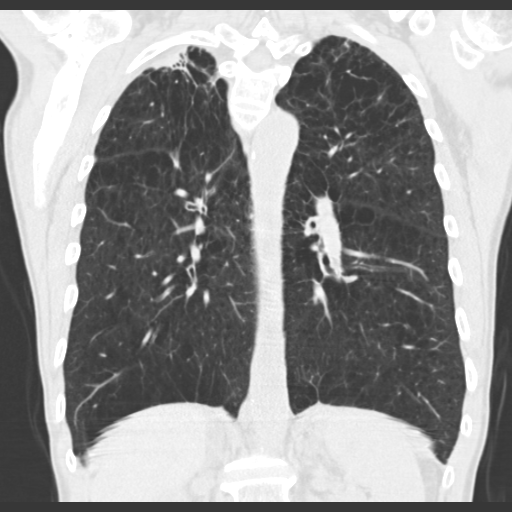

[15 of 36 positions shown; findings below may reference images not displayed]

FINDINGS: Mediastinum: Heart size is normal. There is no significant
pericardial fluid, thickening or pericardial calcification. There is
atherosclerosis of the thoracic aorta, the great vessels of the
mediastinum and the coronary arteries, including calcified
atherosclerotic plaque in the left main, left anterior descending,
left circumflex and right coronary arteries. Mild ectasia of the
ascending thoracic aorta which approximately 4.1 cm in diameter. The
aortic root also appears potentially dilated, with estimated
diameter of the sinuses of Valsalva of approximately 4.5 cm. No
pathologically enlarged mediastinal or hilar lymph nodes. Please
note that accurate exclusion of hilar adenopathy is limited on
noncontrast CT scans. Esophagus is unremarkable in appearance.

Lungs/Pleura: Moderate centrilobular and mild paraseptal emphysema,
most pronounced throughout the lung apices bilaterally. Definite
suspicious appearing pulmonary nodules or masses. No acute
consolidative airspace disease. No pleural effusions. Mild diffuse
bronchial wall thickening. There are some scattered areas very mild
cylindrical bronchiectasis with marked thickening of the
peribronchovascular interstitium, often times associated with
architectural distortion, most pronounced in the apex of the right
upper lobe and in the posterior aspect of the left upper lobe,
similar to the study, most compatible with chronic post infectious
inflammatory scarring

Upper Abdomen: Unremarkable.

Musculoskeletal: There are no aggressive appearing lytic or blastic
lesions noted in the visualized portions of the skeleton.
IMPRESSION: 1. No suspicious appearing pulmonary nodules or masses.
2. Diffuse bronchial wall thickening with moderate centrilobular
mild paraseptal emphysema; imaging findings suggestive of underlying
COPD.
3. Upper lung scarring redemonstrated, as above.
4. Atherosclerosis, including left main and 3 vessel coronary artery
disease. Please note that although the presence of coronary artery
calcium documents the presence of coronary artery disease, the
severity of this disease and any potential stenosis cannot be
assessed on this non-gated CT examination. Assessment for potential
risk factor modification, dietary therapy or pharmacologic therapy
may be warranted, if clinically indicated.
5. In addition, there is mild ectasia of the ascending thoracic
aorta (4.1 cm), and the aortic root appears slightly dilated,
estimated to measure approximately 4.5 cm in diameter at the level
of the sinuses of Valsalva on today's non gated non contrast CT
examination

## 2013-07-15 ENCOUNTER — Other Ambulatory Visit: Payer: Self-pay

## 2013-07-15 MED ORDER — FLUTICASONE-SALMETEROL 250-50 MCG/DOSE IN AEPB: 1.0000 | INHALATION_SPRAY | Freq: Two times a day (BID) | RESPIRATORY_TRACT | Status: DC

## 2013-07-31 ENCOUNTER — Other Ambulatory Visit: Payer: Self-pay | Admitting: Pulmonary Disease

## 2013-08-02 ENCOUNTER — Telehealth: Payer: Self-pay | Admitting: Pulmonary Disease

## 2013-08-02 MED ORDER — ALBUTEROL SULFATE HFA 108 (90 BASE) MCG/ACT IN AERS: 2.0000 | INHALATION_SPRAY | Freq: Four times a day (QID) | RESPIRATORY_TRACT | Status: DC | PRN

## 2013-08-02 NOTE — Telephone Encounter (Signed)
Rx has been sent in. Pt is aware. 

## 2013-08-28 ENCOUNTER — Other Ambulatory Visit: Payer: Self-pay | Admitting: Pulmonary Disease

## 2013-09-01 ENCOUNTER — Telehealth: Payer: Self-pay | Admitting: Pulmonary Disease

## 2013-09-01 MED ORDER — TIOTROPIUM BROMIDE MONOHYDRATE 18 MCG IN CAPS: ORAL_CAPSULE | RESPIRATORY_TRACT | Status: DC

## 2013-09-01 NOTE — Telephone Encounter (Signed)
lmomtcb x1 rx sent

## 2013-09-01 NOTE — Telephone Encounter (Signed)
Patient returned call.  Informed patient rx was sent.

## 2013-09-08 ENCOUNTER — Ambulatory Visit (INDEPENDENT_AMBULATORY_CARE_PROVIDER_SITE_OTHER): Payer: 59 | Admitting: Pulmonary Disease

## 2013-09-08 ENCOUNTER — Encounter: Payer: Self-pay | Admitting: Pulmonary Disease

## 2013-09-08 ENCOUNTER — Telehealth: Payer: Self-pay | Admitting: Pulmonary Disease

## 2013-09-08 VITALS — BP 142/90 | HR 91 | Temp 97.5°F | Ht 72.0 in | Wt 134.6 lb

## 2013-09-08 DIAGNOSIS — R911 Solitary pulmonary nodule: Secondary | ICD-10-CM

## 2013-09-08 DIAGNOSIS — J439 Emphysema, unspecified: Secondary | ICD-10-CM

## 2013-09-08 DIAGNOSIS — J441 Chronic obstructive pulmonary disease with (acute) exacerbation: Secondary | ICD-10-CM | POA: Insufficient documentation

## 2013-09-08 DIAGNOSIS — J438 Other emphysema: Secondary | ICD-10-CM

## 2013-09-08 MED ORDER — PREDNISONE 10 MG PO TABS: ORAL_TABLET | ORAL | Status: DC

## 2013-09-08 NOTE — Assessment & Plan Note (Signed)
The patient's history is most consistent with a COPD exacerbation. He a course of prednisone to get him back to his usual baseline. I suspect that he does not have a bronchial infection at this time, but he is to call me if he begins to bring up larger quantities of purulent mucus as we treat his airway inflammation. He is to continue on his current maintenance medications.

## 2013-09-08 NOTE — Patient Instructions (Signed)
Prednisone taper over 10 days.  Please call if you begin to cough up larger quantities of discolored mucus. No change in daily meds. followup with me at your usual apptm.

## 2013-09-08 NOTE — Progress Notes (Addendum)
   Subjective:    Patient ID: Francisco [name], male    DOB: 1945/11/09, 68 y.o.   MRN: [id number]  HPI Patient comes in today for an acute sick visit. He has known COPD, and has had increasing shortness of breath, chest tightness, as well as cough and wheezing. He does not feel that he has a chest cold, but he is bringing up occasional discolored mucus. He has had no fevers, chills, or sweats.   Review of Systems  Constitutional: Negative for fever and unexpected weight change.  HENT: Positive for congestion, postnasal drip and sinus pressure. Negative for dental problem, ear pain, nosebleeds, rhinorrhea, sneezing, sore throat and trouble swallowing.   Eyes: Negative for redness and itching.  Respiratory: Positive for cough, chest tightness, shortness of breath and wheezing.   Cardiovascular: Negative for palpitations and leg swelling.  Gastrointestinal: Negative for nausea and vomiting.  Genitourinary: Negative for dysuria.  Musculoskeletal: Negative for joint swelling.  Skin: Negative for rash.  Neurological: Negative for headaches.  Hematological: Bruises/bleeds easily.  Psychiatric/Behavioral: Negative for dysphoric mood. The patient is not nervous/anxious.        Objective:   Physical Exam Thin male in no acute distress Nose without purulence or discharge noted Neck without lymphadenopathy or thyromegaly Chest with decreased breath sounds and rhonchi, a few wheezes noted Cardiac exam with regular rate and rhythm Lower extremities without edema, no cyanosis Alert and oriented, moves all 4 extremities.       Assessment & Plan:

## 2013-09-08 NOTE — Telephone Encounter (Signed)
Spoke with pt - c/o increased sob for 2 weeks. Increase use of inhaler, occas wheezing, occas prod cough, sinus drainage (green), low grade temp.  Pt given appt today with Dr [name] at 10:15.

## 2013-09-09 ENCOUNTER — Other Ambulatory Visit: Payer: Self-pay | Admitting: *Deleted

## 2013-09-09 MED ORDER — TIOTROPIUM BROMIDE MONOHYDRATE 18 MCG IN CAPS: 18.0000 ug | ORAL_CAPSULE | Freq: Every day | RESPIRATORY_TRACT | Status: DC

## 2013-09-15 ENCOUNTER — Telehealth: Payer: Self-pay | Admitting: Pulmonary Disease

## 2013-09-15 MED ORDER — LEVOFLOXACIN 750 MG PO TABS: 750.0000 mg | ORAL_TABLET | Freq: Every day | ORAL | Status: DC

## 2013-09-15 NOTE — Telephone Encounter (Signed)
Called spoke with pt. He reports he has 2 days left of prednisone.  he has been coughing up green phlem x 2 days, wheezing, chest tx and slight increase in SOB. He reports [place] advised him if he started coughing up phlem to call for ABX. Pt seen 09/08/13. Please advise [place] thanks  No Known Allergies

## 2013-09-15 NOTE — Telephone Encounter (Signed)
Called spoke with patient and advised of [place] 750mg  #5.  Pt okay with this and verbalized his understanding to call back if this does not resolve his symptoms.  Rx sent to verified pharmacy.

## 2013-09-15 NOTE — Telephone Encounter (Signed)
Ok to call in levaquin 750mg  one a day for 5 days. Let us know if this does not resolve things.

## 2013-09-15 NOTE — Telephone Encounter (Signed)
Pt is waiting on call from nurse Pt would like to hear something today

## 2013-09-16 ENCOUNTER — Other Ambulatory Visit: Payer: Self-pay | Admitting: Pulmonary Disease

## 2013-09-22 ENCOUNTER — Other Ambulatory Visit: Payer: Self-pay | Admitting: Pulmonary Disease

## 2013-10-05 ENCOUNTER — Telehealth: Payer: Self-pay | Admitting: Pulmonary Disease

## 2013-10-05 MED ORDER — PREDNISONE 10 MG PO TABS: ORAL_TABLET | ORAL | Status: DC

## 2013-10-05 NOTE — Telephone Encounter (Signed)
Pt returned call & can be reached at [phone].  Francisco [name]

## 2013-10-05 NOTE — Telephone Encounter (Signed)
If he is still smoking, no reason to increase his medication.  Just needs to quit smoking to keep from having recurrent flareups.

## 2013-10-05 NOTE — Telephone Encounter (Signed)
lmomtcb x1 for pt 

## 2013-10-05 NOTE — Telephone Encounter (Signed)
He should not have this occurring again on top of recent issue that required abx/;pred.  See if he is smoking.  I think he isn't but do not know for sure.  If he is, he needs to stop or won't stay well.  If he is not smoking, would like to increase advair to 500/50 strength one bid. Can also call in 8 day prednisone taper.

## 2013-10-05 NOTE — Telephone Encounter (Signed)
Per OV 09/08/13: Patient Instructions      Prednisone taper over 10 days.   Please call if you begin to cough up larger quantities of discolored mucus. No change in daily meds. followup with me at your usual apptm.    --  Spoke with pt. He reports he is still having increase SOB, chest congestion, prod cough clear phlem, wheezing, slight chest tx. He has used rescue inhaler about 6 times since Sunday. Requesting further recs. Please advise [place] thanks  No Known Allergies

## 2013-10-05 NOTE — Telephone Encounter (Signed)
Called spoke with pt. He reports he is smoking about twice a day. Aware he needs to stop in order to get completely well. He just received a 3 month supply on his advair 250-50 last week from mail order. Wants to know if he should double up the amount he takes on this? Or just continue it the same since he just got this? RX for prednisone has been called in. Please advise [place] thanks

## 2013-10-05 NOTE — Telephone Encounter (Signed)
Called and made pt aware. Nothing further needed 

## 2013-10-11 ENCOUNTER — Telehealth: Payer: Self-pay | Admitting: Pulmonary Disease

## 2013-10-11 NOTE — Telephone Encounter (Signed)
He either needs to be seen today by someone, or he needs to go to ER.  Based on his symptoms, and just finishing abx and pred, he needs to be evaluated TODAY

## 2013-10-11 NOTE — Telephone Encounter (Signed)
Called spoke with pt. appt scheduled to see [place] in the AM at 9. He needed nothing further needed. He will call back if he needs [place] sooner

## 2013-10-11 NOTE — Telephone Encounter (Signed)
Called and spoke with spouse-made aware of recs. She reports pt refuses to go to ED. She has tried over and over. No openings in office today with any provider and TP not in. She reports pt wants to wait until tomorrow to see [facility] then. Will forward so he is aware

## 2013-10-11 NOTE — Telephone Encounter (Signed)
Will see in am

## 2013-10-11 NOTE — Telephone Encounter (Signed)
Went to speak with spouse in lobby Per Katy Apo, pt has been really having a hard time with dyspnea and wheezing this past weekend, with acute symptoms yesterday. Katy Apo reported that pt had to use his rescue inhaler 3times within a span of 20mins yesterday with no relief.  He had a whistling sound and was having to breathing with his mouth wide open, near gasping for air.  She tried to take him to the ER but pt had refused.  At about 10pm last night his breathing finally calmed down and he is reportedly doing somewhat better today.  [place], pt is already scheduled to see you tomorrow morning at 9am per the other 4.6.15 phone note but spouse is requesting something to hold him until then. Pt was just given 8 day pred taper on 3.31.15 and will finish this today per spouse. Pt was given Levaquin 750mg  x5 days on 3.11.15  [place] please advise, thank you.

## 2013-10-12 ENCOUNTER — Ambulatory Visit (INDEPENDENT_AMBULATORY_CARE_PROVIDER_SITE_OTHER): Payer: 59 | Admitting: Pulmonary Disease

## 2013-10-12 ENCOUNTER — Encounter: Payer: Self-pay | Admitting: Pulmonary Disease

## 2013-10-12 ENCOUNTER — Ambulatory Visit (INDEPENDENT_AMBULATORY_CARE_PROVIDER_SITE_OTHER)
Admission: RE | Admit: 2013-10-12 | Discharge: 2013-10-12 | Disposition: A | Discharge status: Home / Self Care | Payer: 59 | Source: Ambulatory Visit | Attending: Pulmonary Disease | Admitting: Pulmonary Disease

## 2013-10-12 VITALS — BP 118/72 | HR 68 | Temp 98.0°F | Ht 72.0 in | Wt 130.7 lb

## 2013-10-12 DIAGNOSIS — J439 Emphysema, unspecified: Secondary | ICD-10-CM

## 2013-10-12 DIAGNOSIS — J441 Chronic obstructive pulmonary disease with (acute) exacerbation: Secondary | ICD-10-CM

## 2013-10-12 DIAGNOSIS — J438 Other emphysema: Secondary | ICD-10-CM

## 2013-10-12 IMAGING — CR DG CHEST 2V
2 series · 2 of 2 positions shown · non-contrast
Comparison: [DATE].

CLINICAL DATA: Cough.  Short of breath.

EXAM:
CHEST  2 VIEW

[view not recorded (1 of 2)]
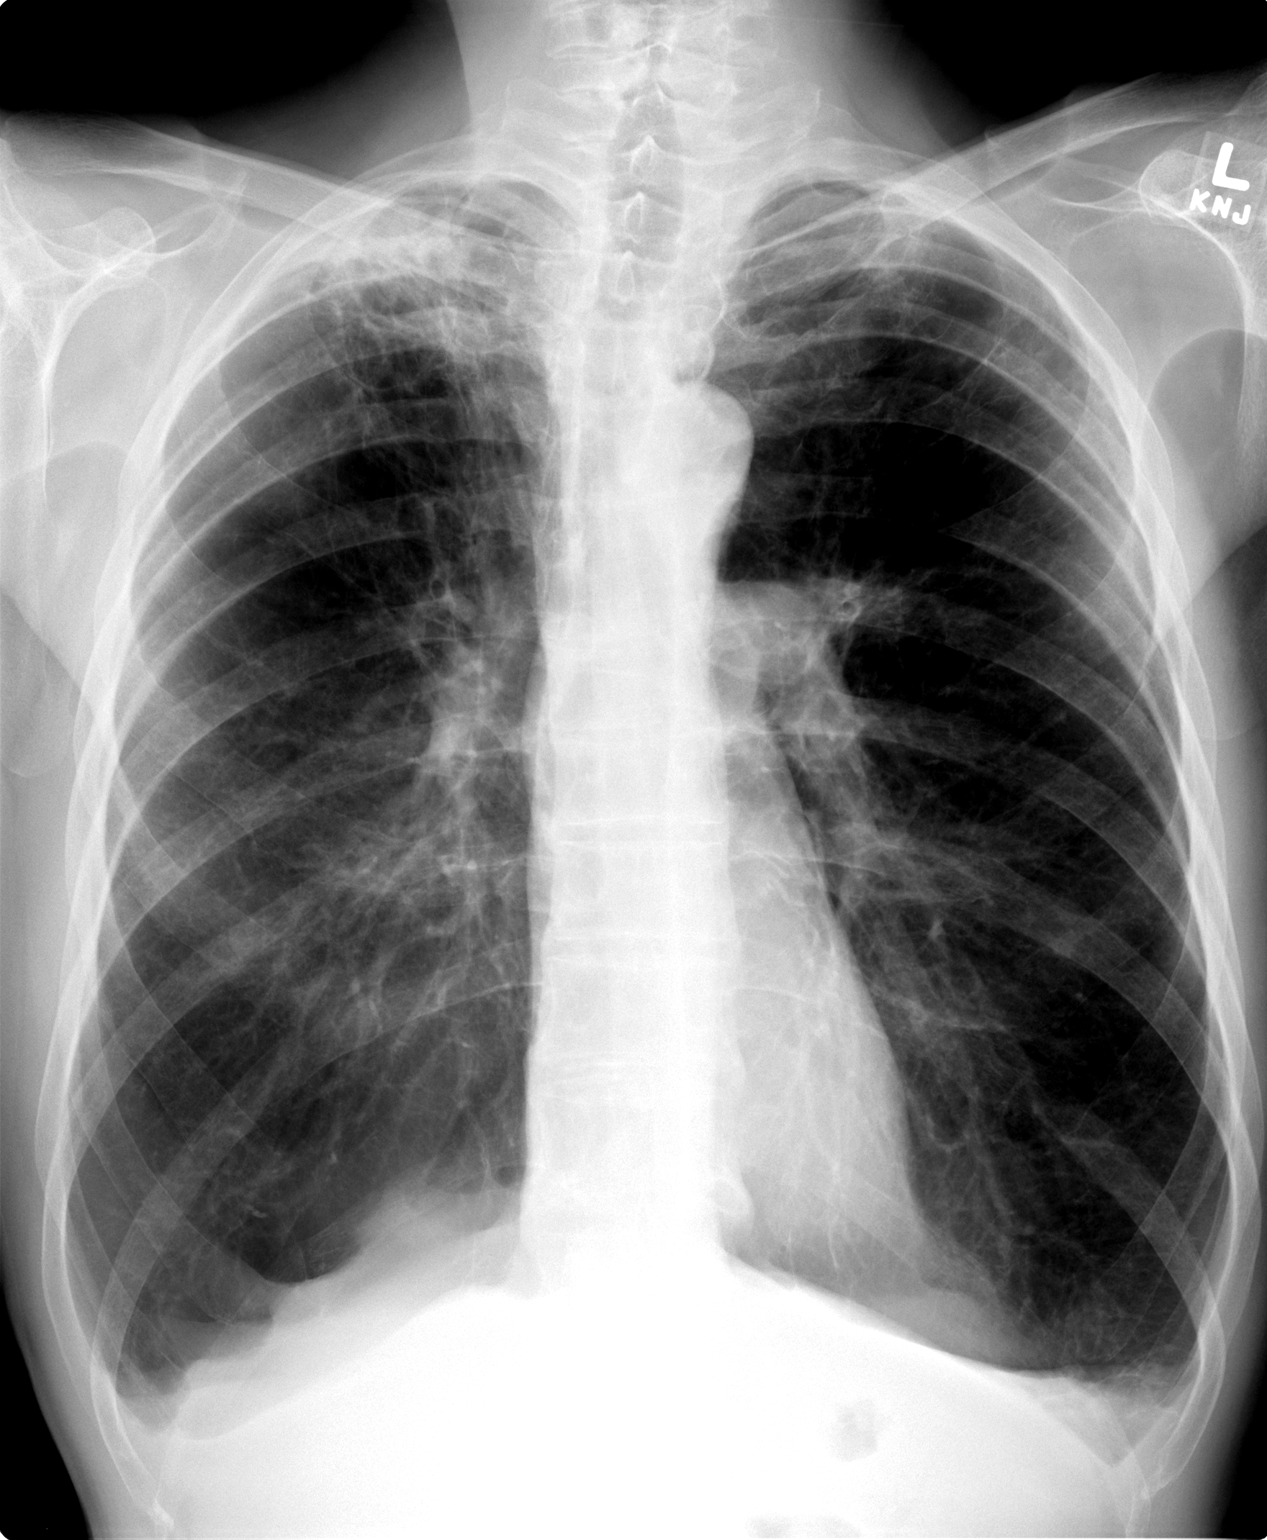

[view not recorded (2 of 2)]
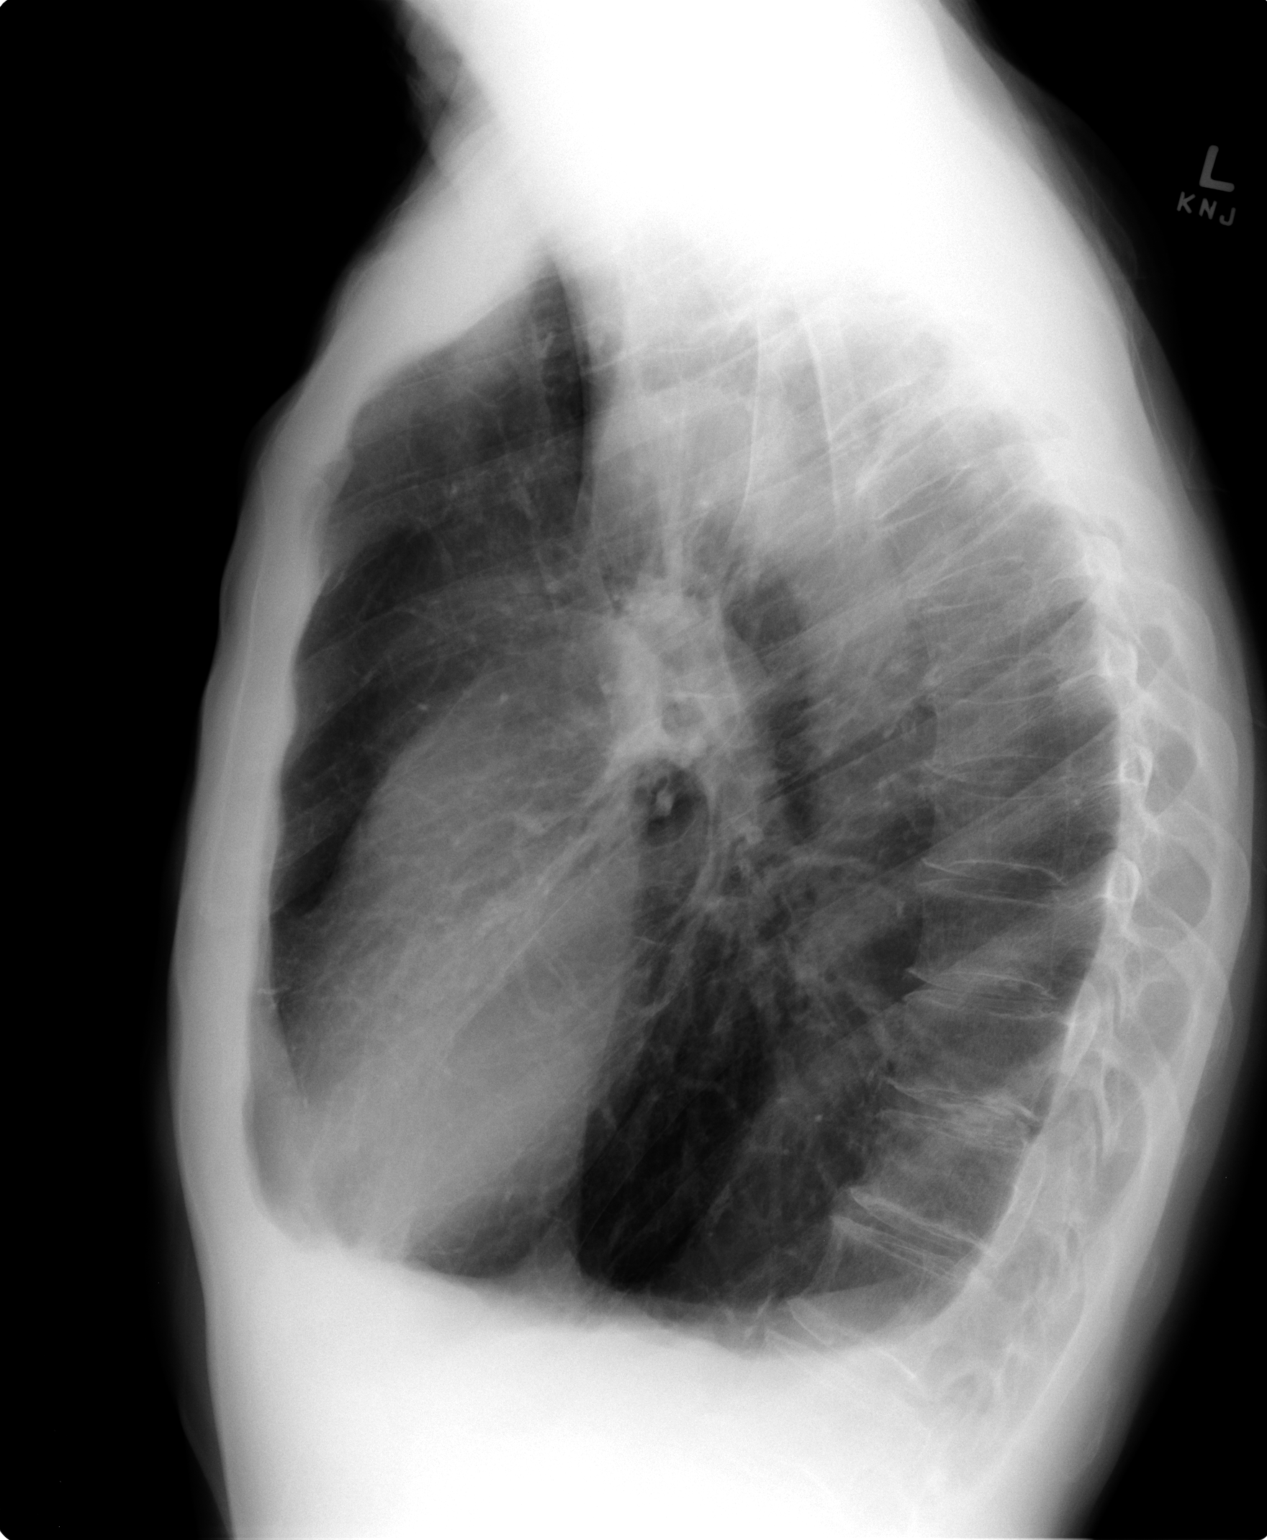

[2 of 2 positions shown; findings below may reference images not displayed]

FINDINGS: Cardiac silhouette is normal in size and configuration. Normal
mediastinal contours. Both hila are retracted superiorly by upper
lobe scarring. No hilar masses or adenopathy.

Lungs are hyperexpanded. Lung scarring is stable. No lung
mass/nodule. No consolidation. No evidence of edema. No
pneumothorax. No convincing pleural effusion.

Bony thorax is demineralized but grossly intact.
IMPRESSION: 1. No acute cardiopulmonary disease.
2. Advanced COPD.  Prominent upper lobe scarring.

## 2013-10-12 MED ORDER — PREDNISONE 10 MG PO TABS: ORAL_TABLET | ORAL | Status: DC

## 2013-10-12 NOTE — Patient Instructions (Signed)
Will treat with another 8d course of prednisone. Do not smoke so that we can see if this clears up. No change in daily medication Will check chest xray today, and call you with results. Please call if you are not getting back to your usual baseline.

## 2013-10-12 NOTE — Assessment & Plan Note (Signed)
The patient is having increased shortness of breath and a feeling of congestion, despite being on a recent course of prednisone. It least his mucus has cleared since finishing a course of Levaquin. Unfortunately, he continues to smoke, and this may be driving his airway inflammation despite being on prednisone. I will treat him with another course of prednisone, and also check a chest x-ray today for completeness. However, I think we need to keep in mind that he may have concomitant coronary disease, and that his symptoms could be related to cardiac ischemia. If he does not return to baseline with this treatment plan, he will need further evaluation with cardiology. I have stressed to him again the importance of total smoking cessation given the severity of his lung disease.

## 2013-10-12 NOTE — Progress Notes (Signed)
   Subjective:    Patient ID: Francisco [name], male    DOB: Feb 29, 1946, 68 y.o.   MRN: [id number]  HPI Patient comes in today for an acute sick visit. He has known severe COPD with asthmatic component, but unfortunately continues to smoke. He is had issues since the beginning of March, and has been treated with 2 rounds of prednisone and a course of Levaquin. He just recently finished his prednisone course. He did not feel that he responded very well to this round of prednisone, and continues to have increasing shortness of breath, chest congestion, and a cough that is productive only of white mucus at this time. He also has some chest tightness intermittently.     Review of Systems  Constitutional: Negative for fever and unexpected weight change.  HENT: Negative for congestion, dental problem, ear pain, nosebleeds, postnasal drip, rhinorrhea, sinus pressure, sneezing, sore throat and trouble swallowing.   Eyes: Negative for redness and itching.  Respiratory: Positive for cough, chest tightness, shortness of breath and wheezing.   Cardiovascular: Negative for palpitations and leg swelling.  Gastrointestinal: Negative for nausea and vomiting.  Genitourinary: Negative for dysuria.  Musculoskeletal: Negative for joint swelling.  Skin: Negative for rash.  Neurological: Negative for headaches.  Hematological: Does not bruise/bleed easily.  Psychiatric/Behavioral: Negative for dysphoric mood. The patient is not nervous/anxious.        Objective:   Physical Exam Thin male in no acute distress Nose without purulence or discharge noted Neck without lymphadenopathy or thyromegaly Chest with very diminished breath sounds, a few wheezes on the right but clear on the left. Cardiac exam with regular rate and rhythm but distant Lower extremities without edema, no cyanosis Alert and oriented, moves all 4 extremities.       Assessment & Plan:

## 2013-10-13 ENCOUNTER — Ambulatory Visit: Payer: 59 | Admitting: Pulmonary Disease

## 2013-10-13 NOTE — Progress Notes (Signed)
Quick Note:  Called and spoke to pt regarding results. Pt verbalized understanding and denied any further questions or concerns at this time. ______

## 2013-10-18 ENCOUNTER — Encounter: Payer: Self-pay | Admitting: Pulmonary Disease

## 2013-10-26 ENCOUNTER — Other Ambulatory Visit: Payer: Self-pay | Admitting: Dermatology

## 2013-10-26 ENCOUNTER — Telehealth: Payer: Self-pay | Admitting: Pulmonary Disease

## 2013-10-26 MED ORDER — LEVOFLOXACIN 750 MG PO TABS: 750.0000 mg | ORAL_TABLET | Freq: Every day | ORAL | Status: DC

## 2013-10-26 NOTE — Telephone Encounter (Signed)
Pt aware of recs. RX called in. Nothing further needed 

## 2013-10-26 NOTE — Telephone Encounter (Signed)
Called spoke with pt. C/o x Saturday he had fever of 100.3 (but now gone), prod cough green-yellow phlem, chest and nasal congestion, PND at bedtime, wheezing. He is using his flonase and taking singulair. Pt finished pred taper from 10/12/13 OV. He was feeling better up until Saturday. Requesting recs. Please advise [place] thanks  No Known Allergies

## 2013-10-26 NOTE — Telephone Encounter (Signed)
Since he is no better, and now coughing up discolored mucus, will treat with an abx. Send in levaquin 750mg  one daily for 7 days.  If not doing better, will need to know.

## 2013-11-02 ENCOUNTER — Telehealth: Payer: Self-pay | Admitting: Pulmonary Disease

## 2013-11-02 MED ORDER — PREDNISONE 10 MG PO TABS
ORAL_TABLET | ORAL | Status: DC
Start: 2013-11-02 — End: 2013-12-02

## 2013-11-02 MED ORDER — LEVOFLOXACIN 750 MG PO TABS: 750.0000 mg | ORAL_TABLET | Freq: Every day | ORAL | Status: DC

## 2013-11-02 NOTE — Telephone Encounter (Signed)
Talked with pt.  His mucus has cleared, but still a lot of it.  Still congested, and not clearing this.   Have discussed giving this more time vs treating with another round of abx.  We have decided on the latter.   Call in levaquin 750mg  one a day for 5 days 8day prednisone taper.

## 2013-11-02 NOTE — Telephone Encounter (Signed)
10-12-13 pt instructions:  Patient Instructions     Will treat with another 8d course of prednisone.  Do not smoke so that we can see if this clears up.  No change in daily medication  Will check chest xray today, and call you with results.  Please call if you are not getting back to your usual baseline   Spoke with patient-he completed abx yesterday-helped clear up colored phlegm and fevers; continues to have chest congestion and labored breathing. Was told to call if no better. [place], please advise.

## 2013-11-02 NOTE — Telephone Encounter (Signed)
Pharmacy is [facility] [address]-called and confirmed with patient; Rx's sent and patient aware. Nothing more needed at this time.

## 2013-11-26 ENCOUNTER — Ambulatory Visit: Payer: 59 | Admitting: Pulmonary Disease

## 2013-12-02 ENCOUNTER — Telehealth: Payer: Self-pay | Admitting: Pulmonary Disease

## 2013-12-02 MED ORDER — PREDNISONE 10 MG PO TABS: ORAL_TABLET | ORAL | Status: DC

## 2013-12-02 NOTE — Telephone Encounter (Signed)
Spoke with pt and advised of Dr Janifer Adie recommendations.  Rx for Pred taper sent to pharmacy

## 2013-12-02 NOTE — Telephone Encounter (Signed)
Can call in 8 day prednisone taper, but needs ov today or tomorrow if possible. Will be a continuing them unless he is able to stop smoking.

## 2013-12-02 NOTE — Telephone Encounter (Signed)
Spoke with pt .  He states that he already has appt with [place] next week on 12/10/13.  He wants to know if he can just keep this appt and get pred taper called in since [facility] does not have openings today or tomorrow .  Please advise

## 2013-12-02 NOTE — Telephone Encounter (Signed)
That's fine, but if he is getting worse can call and see if we can get him in somewhere.

## 2013-12-02 NOTE — Telephone Encounter (Signed)
Pt c/o increased chest congestion, sinus congestion, PND, SOB, prod cough with clear- yellow/Lindley Stachnik mucus x 1 week. Worsening. Pt reports having to use rescue inhaler more frequent. Pt has upcoming appt 12/10/13 at 10:45 - pt would like to know if he can be seen sooner or if something can be called in to pharmacy.  No Known Allergies  [facility] [address]  Please advise Dr [name]. Thanks.

## 2013-12-10 ENCOUNTER — Ambulatory Visit (INDEPENDENT_AMBULATORY_CARE_PROVIDER_SITE_OTHER): Payer: 59 | Admitting: Pulmonary Disease

## 2013-12-10 ENCOUNTER — Encounter: Payer: Self-pay | Admitting: Pulmonary Disease

## 2013-12-10 VITALS — BP 122/76 | HR 84 | Temp 98.0°F | Ht 72.0 in | Wt 134.8 lb

## 2013-12-10 DIAGNOSIS — J438 Other emphysema: Secondary | ICD-10-CM

## 2013-12-10 DIAGNOSIS — J310 Chronic rhinitis: Secondary | ICD-10-CM

## 2013-12-10 DIAGNOSIS — J439 Emphysema, unspecified: Secondary | ICD-10-CM

## 2013-12-10 DIAGNOSIS — F17200 Nicotine dependence, unspecified, uncomplicated: Secondary | ICD-10-CM

## 2013-12-10 NOTE — Assessment & Plan Note (Signed)
The patient is getting over a recent severe exacerbation of his COPD. I suspect his ongoing issues are related to his chronic sinusitis and also his ongoing smoking. I have had a long discussion with him today about improve nasal hygiene, and that he must stop smoking if he wants to prevent recurrent exacerbations. He is on an excellent bronchodilator regimen, and is going to take other behavioral changes before he can see a decrease in his numbers of exacerbations.

## 2013-12-10 NOTE — Patient Instructions (Signed)
Continue on usual inhalers Try neilmed sinus rinses, and can use am and pm whenever you feel your nasal symptoms getting worse.  Will give you sample today, and you can get refills at drug store. Work on total smoking cessation.  Follow up again with me in 43mos, but call if having issues.

## 2013-12-10 NOTE — Assessment & Plan Note (Signed)
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 He may need to get back to otolaryngology if he has another episode on the heels of this resolving episode.

## 2013-12-10 NOTE — Progress Notes (Signed)
   Subjective:    Patient ID: Francisco [name], male    DOB: 1946/05/11, 68 y.o.   MRN: [id number]  HPI Patient comes in today for followup of his known severe COPD. Unfortunately he is continuing to smoke, and is just getting over a severe episode of sinobronchitis which required 2 weeks of antibiotics and multiple rounds of prednisone. He feels that he is much improved, and that his exertional tolerance is nearly back to baseline. He still does have some cough with mucus production, but feels that it is moving in the right direction. He did say this all started with sinus symptoms.   Review of Systems  Constitutional: Negative for fever and unexpected weight change.  HENT: Positive for congestion and postnasal drip. Negative for dental problem, ear pain, nosebleeds, rhinorrhea, sinus pressure, sneezing, sore throat and trouble swallowing.   Eyes: Negative for redness and itching.  Respiratory: Positive for cough and shortness of breath. Negative for chest tightness and wheezing.   Cardiovascular: Negative for palpitations and leg swelling.  Gastrointestinal: Negative for nausea and vomiting.  Genitourinary: Negative for dysuria.  Musculoskeletal: Negative for joint swelling.  Skin: Negative for rash.  Neurological: Negative for headaches.  Hematological: Does not bruise/bleed easily.  Psychiatric/Behavioral: Negative for dysphoric mood. The patient is not nervous/anxious.        Objective:   Physical Exam Thin male in no acute distress Nose without purulence or discharge noted Neck without lymphadenopathy or thyromegaly Chest with rhonchi noted, no active wheezing, and adequate airflows seen. Cardiac exam with regular rate and rhythm Lower extremities without edema, no cyanosis Alert and oriented, moves all 4 extremities.        Assessment & Plan:

## 2013-12-16 ENCOUNTER — Other Ambulatory Visit: Payer: Self-pay | Admitting: Pulmonary Disease

## 2014-01-13 ENCOUNTER — Telehealth: Payer: Self-pay | Admitting: Pulmonary Disease

## 2014-01-13 DIAGNOSIS — J329 Chronic sinusitis, unspecified: Secondary | ICD-10-CM

## 2014-01-13 MED ORDER — AMOXICILLIN-POT CLAVULANATE 875-125 MG PO TABS: 1.0000 | ORAL_TABLET | Freq: Two times a day (BID) | ORAL | Status: DC

## 2014-01-13 NOTE — Telephone Encounter (Signed)
Spoke w/ pt. He see's Dr. Erik Obey. I have placed referral to get pt an appt. RX for ABX sent to the pharm. Nothing further needed

## 2014-01-13 NOTE — Telephone Encounter (Signed)
See if he has ever seen ENT.  If so, he needs to see them for recurrent sinusitis.  If he has not, refer to [facility]/shoemaker/[facility], whoever he can see first.  Will treat him with augmentin 875mg  bid with glass of water and on full stomach for 10 days Let him know that his ongoing smoking contributes to his recurrent sinopulmonary infections.

## 2014-01-13 NOTE — Telephone Encounter (Signed)
Per OV 12/10/13; Patient Instructions     Continue on usual inhalers  Try neilmed sinus rinses, and can use am and pm whenever you feel your nasal symptoms getting worse. Will give you sample today, and you can get refills at drug store.  Work on total smoking cessation.  Follow up again with me in 22mos, but call if having issues  --  Called spoke with pt. C/o sinus infection, C/o nasal congestion, right ear stopped up, pressure under right eye, prod cough-yellow phlem, PND. NO f/c/s/n/v. Pt is doing neilmed sinus rinse QD, flonase and singulair. Please advise [place] thanks  No Known Allergies   Current Outpatient Prescriptions on File Prior to Visit  Medication Sig Dispense Refill  . fluticasone (FLONASE) 50 MCG/ACT nasal spray Place 2 sprays into the nose daily.  48 g  3  . Fluticasone-Salmeterol (ADVAIR DISKUS) 250-50 MCG/DOSE AEPB Inhale 1 puff into the lungs 2 (two) times daily.  180 each  6  . montelukast (SINGULAIR) 10 MG tablet TAKE 1 TABLET (10 MG TOTAL) BY MOUTH AT BEDTIME.  30 tablet  6  . terbinafine (LAMISIL) 250 MG tablet Take 250 mg by mouth See admin instructions. Take 1 tablet daily for 7 days every month      . tiotropium (SPIRIVA HANDIHALER) 18 MCG inhalation capsule Place 1 capsule (18 mcg total) into inhaler and inhale daily.  90 capsule  3  . VENTOLIN HFA 108 (90 BASE) MCG/ACT inhaler INHALE 2 PUFFS INTO THE LUNGS EVERY 6 HOURS AS NEEDED FOR WHEEZING*NEEDS OFFICE VISI BEFORE REFILLS)  18 each  3   No current facility-administered medications on file prior to visit.

## 2014-02-22 ENCOUNTER — Other Ambulatory Visit: Payer: Self-pay | Admitting: Pulmonary Disease

## 2014-02-25 ENCOUNTER — Ambulatory Visit (INDEPENDENT_AMBULATORY_CARE_PROVIDER_SITE_OTHER): Payer: 59 | Admitting: Family

## 2014-02-25 ENCOUNTER — Encounter: Payer: Self-pay | Admitting: Family

## 2014-02-25 VITALS — BP 118/70 | HR 90 | Temp 98.1°F | Ht 71.5 in | Wt 132.0 lb

## 2014-02-25 DIAGNOSIS — Z720 Tobacco use: Secondary | ICD-10-CM

## 2014-02-25 DIAGNOSIS — J438 Other emphysema: Secondary | ICD-10-CM

## 2014-02-25 DIAGNOSIS — F17200 Nicotine dependence, unspecified, uncomplicated: Secondary | ICD-10-CM

## 2014-02-25 DIAGNOSIS — Z0000 Encounter for general adult medical examination without abnormal findings: Secondary | ICD-10-CM

## 2014-02-25 DIAGNOSIS — Z125 Encounter for screening for malignant neoplasm of prostate: Secondary | ICD-10-CM

## 2014-02-25 DIAGNOSIS — Z23 Encounter for immunization: Secondary | ICD-10-CM

## 2014-02-25 DIAGNOSIS — R062 Wheezing: Secondary | ICD-10-CM

## 2014-02-25 LAB — LIPID PANEL
Cholesterol: 216 mg/dL — ABNORMAL HIGH (ref 0–200)
HDL: 101.5 mg/dL (ref >39.00)
LDL Cholesterol: 108 mg/dL — ABNORMAL HIGH (ref 0–99)
NONHDL: 114.5
Total CHOL/HDL Ratio: 2
Triglycerides: 35 mg/dL (ref 0.0–149.0)
VLDL: 7 mg/dL (ref 0.0–40.0)

## 2014-02-25 LAB — CBC WITH DIFFERENTIAL/PLATELET
BASOS PCT: 0.6 % (ref 0.0–3.0)
Basophils Absolute: 0 10*3/uL (ref 0.0–0.1)
EOS ABS: 0.3 10*3/uL (ref 0.0–0.7)
Eosinophils Relative: 4.5 % (ref 0.0–5.0)
HCT: 45.5 % (ref 39.0–52.0)
Hemoglobin: 14.9 g/dL (ref 13.0–17.0)
LYMPHS PCT: 22.9 % (ref 12.0–46.0)
Lymphs Abs: 1.4 10*3/uL (ref 0.7–4.0)
MCHC: 32.8 g/dL (ref 30.0–36.0)
MCV: 97.4 fl (ref 78.0–100.0)
MONOS PCT: 10.2 % (ref 3.0–12.0)
Monocytes Absolute: 0.6 10*3/uL (ref 0.1–1.0)
NEUTROS PCT: 61.8 % (ref 43.0–77.0)
Neutro Abs: 3.8 10*3/uL (ref 1.4–7.7)
Platelets: 230 10*3/uL (ref 150.0–400.0)
RBC: 4.67 Mil/uL (ref 4.22–5.81)
RDW: 13.4 % (ref 11.5–15.5)
WBC: 6.1 10*3/uL (ref 4.0–10.5)

## 2014-02-25 LAB — PSA: PSA: 0.71 ng/mL (ref 0.10–4.00)

## 2014-02-25 LAB — POCT URINALYSIS DIPSTICK
BILIRUBIN UA: NEGATIVE
GLUCOSE UA: NEGATIVE
Ketones, UA: NEGATIVE
LEUKOCYTES UA: NEGATIVE
Nitrite, UA: NEGATIVE
Protein, UA: NEGATIVE
Spec Grav, UA: 1.01
UROBILINOGEN UA: 0.2
pH, UA: 6.5

## 2014-02-25 LAB — BASIC METABOLIC PANEL
BUN: 11 mg/dL (ref 6–23)
CHLORIDE: 98 meq/L (ref 96–112)
CO2: 33 meq/L — AB (ref 19–32)
CREATININE: 0.7 mg/dL (ref 0.4–1.5)
Calcium: 9.2 mg/dL (ref 8.4–10.5)
GFR: 119.76 mL/min (ref >60.00)
GLUCOSE: 90 mg/dL (ref 70–99)
Potassium: 4.7 mEq/L (ref 3.5–5.1)
SODIUM: 139 meq/L (ref 135–145)

## 2014-02-25 LAB — HEPATIC FUNCTION PANEL
ALBUMIN: 3.9 g/dL (ref 3.5–5.2)
ALK PHOS: 61 U/L (ref 39–117)
ALT: 19 U/L (ref 0–53)
AST: 31 U/L (ref 0–37)
Bilirubin, Direct: 0.1 mg/dL (ref 0.0–0.3)
TOTAL PROTEIN: 7 g/dL (ref 6.0–8.3)
Total Bilirubin: 0.7 mg/dL (ref 0.2–1.2)

## 2014-02-25 LAB — TSH: TSH: 1.11 u[IU]/mL (ref 0.35–4.50)

## 2014-02-25 MED ORDER — PREDNISONE 20 MG PO TABS: ORAL_TABLET | ORAL | Status: AC

## 2014-02-25 NOTE — Progress Notes (Signed)
Pre visit review using our clinic review tool, if applicable. No additional management support is needed unless otherwise documented below in the visit note. 

## 2014-02-25 NOTE — Progress Notes (Signed)
Subjective:    Patient ID: Francisco [name], male    DOB: 1945-08-02, 68 y.o.   MRN: [id number]  HPI Comments: Patient presents for yearly preventative medicine examination. He has a history of emphysema and is under the care of pulmonology. Continues to smoke.  All immunizations and health maintenance protocols were reviewed with the patient and needed orders were placed.  Appropriate screening laboratory values were ordered for the patient including screening of hyperlipidemia, renal function and hepatic function. If indicated by BPH, a PSA was ordered.  Medication reconciliation,  past medical history, social history, problem list and allergies were reviewed in detail with the patient  Goals were established with regard to smoking cessation, exercise, and  diet in compliance with medications        Review of Systems  Constitutional: Negative.   HENT: Negative.   Eyes: Negative.   Respiratory: Negative.   Cardiovascular: Negative.   Gastrointestinal: Negative.   Endocrine: Negative.   Genitourinary: Negative.   Musculoskeletal: Negative.   Skin: Negative.   Allergic/Immunologic: Negative.   Neurological: Negative.   Hematological: Negative.   Psychiatric/Behavioral: Negative.    Past Medical History  Diagnosis Date  . Onychomycosis     Dr. Judi [name]  . Chest x-ray abnormality   . Weight loss   . COPD (chronic obstructive pulmonary disease)     PFT's rec Jul 17, 2009  . Chronic rhinitis     -Sinus Ct 08/01/2009 >> Bilateral maxillary sinusitis with some mucosal thickeningin the sphenoid and frontal sinuses as well with air fluid levels present -chronic rhinitis flyer Aug 04, 2009    History   Social History  . Marital Status: Married    Spouse Name: N/A    Number of Children: 2  . Years of Education: N/A   Occupational History  .     Social History Main Topics  . Smoking status: Current Every Day Smoker -- 2.00 packs/day for 10 years    Types: Pipe,  Cigarettes    Last Attempt to Quit: 07/09/1975  . Smokeless tobacco: Not on file     Comment: Smokes pipe up to 2-3 times a day  . Alcohol Use: 1.8 oz/week    3 Cans of beer per week     Comment: daily  . Drug Use: No  . Sexual Activity: Not on file   Other Topics Concern  . Not on file   Social History Narrative   Married with children. Pt works as a Veterinary surgeon for [employer]    Past Surgical History  Procedure Laterality Date  . Appendectomy    . Tonsillectomy      Family History  Problem Relation Age of Onset  . Heart disease Mother     CABG in her 25s  . Cancer Mother   . Stroke Father   . Heart disease Father     Died of MI at age 9  . Hepatitis Sister   . Coronary artery disease Other     male 1st degree relative <60    No Known Allergies  Current Outpatient Prescriptions on File Prior to Visit  Medication Sig Dispense Refill  . fluticasone (FLONASE) 50 MCG/ACT nasal spray Place 2 sprays into the nose daily.  48 g  3  . Fluticasone-Salmeterol (ADVAIR DISKUS) 250-50 MCG/DOSE AEPB Inhale 1 puff into the lungs 2 (two) times daily.  180 each  6  . montelukast (SINGULAIR) 10 MG tablet TAKE 1 TABLET (10 MG TOTAL) BY  MOUTH AT BEDTIME.  30 tablet  6  . terbinafine (LAMISIL) 250 MG tablet Take 250 mg by mouth See admin instructions. Take 1 tablet daily for 7 days every month      . tiotropium (SPIRIVA HANDIHALER) 18 MCG inhalation capsule Place 1 capsule (18 mcg total) into inhaler and inhale daily.  90 capsule  3  . VENTOLIN HFA 108 (90 BASE) MCG/ACT inhaler INHALE 2 PUFFS INTO THE LUNGS EVERY 6 HOURS AS NEEDED FOR WHEEZING*NEEDS OFFICE VISI BEFORE REFILLS)  18 each  3   No current facility-administered medications on file prior to visit.    BP 118/70  Pulse 90  Temp(Src) 98.1 F (36.7 C) (Oral)  Ht 5' 11.5" (1.816 m)  Wt 132 lb (59.875 kg)  BMI 18.16 kg/m2  SpO2 91%chart     Objective:   Physical Exam  Constitutional: He is oriented to person,  place, and time. He appears well-developed and well-nourished.  HENT:  Head: Normocephalic and atraumatic.  Right Ear: External ear normal.  Left Ear: External ear normal.  Nose: Nose normal.  Mouth/Throat: Oropharynx is clear and moist.  Eyes: Conjunctivae and EOM are normal. Pupils are equal, round, and reactive to light.  Neck: Normal range of motion. Neck supple. No thyromegaly present.  Cardiovascular: Normal rate, regular rhythm and normal heart sounds.   Pulmonary/Chest: Effort normal and breath sounds normal.  Abdominal: Soft. Bowel sounds are normal.  Musculoskeletal: Normal range of motion.  Neurological: He is alert and oriented to person, place, and time. He has normal reflexes. He displays normal reflexes. No cranial nerve deficit. Coordination normal.  Skin: Skin is warm and dry.  Psychiatric: He has a normal mood and affect.          Assessment & Plan:  Francisco [name] was seen today for annual exam.  Diagnoses and associated orders for this visit:  Preventative health care - POCT urinalysis dipstick - CBC with Differential - TSH - Basic Metabolic Panel - Hepatic Function Panel - Lipid Panel  Other emphysema  Screening for prostate cancer - PSA  Wheezing  Tobacco abuse  Need for prophylactic vaccination against Streptococcus pneumoniae (pneumococcus) - Pneumococcal conjugate vaccine 13-valent  Other Orders - predniSONE (DELTASONE) 20 MG tablet; 60mg  PO qam x 3 days, 40mg  po qam x 3 days, 20mg  qam x 3 days   Call the office with any questions or concerns. Recheck as scheduled and as needed.

## 2014-02-25 NOTE — Patient Instructions (Signed)
Smoking Cessation Quitting smoking is important to your health and has many advantages. However, it is not always easy to quit since nicotine is a very addictive drug. Oftentimes, people try 3 times or more before being able to quit. This document explains the best ways for you to prepare to quit smoking. Quitting takes hard work and a lot of effort, but you can do it. ADVANTAGES OF QUITTING SMOKING  You will live longer, feel better, and live better.  Your body will feel the impact of quitting smoking almost immediately.  Within 20 minutes, blood pressure decreases. Your pulse returns to its normal level.  After 8 hours, carbon monoxide levels in the blood return to normal. Your oxygen level increases.  After 24 hours, the chance of having a heart attack starts to decrease. Your breath, hair, and body stop smelling like smoke.  After 48 hours, damaged nerve endings begin to recover. Your sense of taste and smell improve.  After 72 hours, the body is virtually free of nicotine. Your bronchial tubes relax and breathing becomes easier.  After 2 to 12 weeks, lungs can hold more air. Exercise becomes easier and circulation improves.  The risk of having a heart attack, stroke, cancer, or lung disease is greatly reduced.  After 1 year, the risk of coronary heart disease is cut in half.  After 5 years, the risk of stroke falls to the same as a nonsmoker.  After 10 years, the risk of lung cancer is cut in half and the risk of other cancers decreases significantly.  After 15 years, the risk of coronary heart disease drops, usually to the level of a nonsmoker.  If you are pregnant, quitting smoking will improve your chances of having a healthy baby.  The people you live with, especially any children, will be healthier.  You will have extra money to spend on things other than cigarettes. QUESTIONS TO THINK ABOUT BEFORE ATTEMPTING TO QUIT You may want to talk about your answers with your  health care provider.  Why do you want to quit?  If you tried to quit in the past, what helped and what did not?  What will be the most difficult situations for you after you quit? How will you plan to handle them?  Who can help you through the tough times? Your family? Friends? A health care provider?  What pleasures do you get from smoking? What ways can you still get pleasure if you quit? Here are some questions to ask your health care provider:  How can you help me to be successful at quitting?  What medicine do you think would be best for me and how should I take it?  What should I do if I need more help?  What is smoking withdrawal like? How can I get information on withdrawal? GET READY  Set a quit date.  Change your environment by getting rid of all cigarettes, ashtrays, matches, and lighters in your home, car, or work. Do not let people smoke in your home.  Review your past attempts to quit. Think about what worked and what did not. GET SUPPORT AND ENCOURAGEMENT You have a better chance of being successful if you have help. You can get support in many ways.  Tell your family, friends, and coworkers that you are going to quit and need their support. Ask them not to smoke around you.  Get individual, group, or telephone counseling and support. Programs are available at local hospitals and health centers. Call   your local health department for information about programs in your area.  Spiritual beliefs and practices may help some smokers quit.  Download a "quit meter" on your computer to keep track of quit statistics, such as how long you have gone without smoking, cigarettes not smoked, and money saved.  Get a self-help book about quitting smoking and staying off tobacco. LEARN NEW SKILLS AND BEHAVIORS  Distract yourself from urges to smoke. Talk to someone, go for a walk, or occupy your time with a task.  Change your normal routine. Take a different route to work.  Drink tea instead of coffee. Eat breakfast in a different place.  Reduce your stress. Take a hot bath, exercise, or read a book.  Plan something enjoyable to do every day. Reward yourself for not smoking.  Explore interactive web-based programs that specialize in helping you quit. GET MEDICINE AND USE IT CORRECTLY Medicines can help you stop smoking and decrease the urge to smoke. Combining medicine with the above behavioral methods and support can greatly increase your chances of successfully quitting smoking.  Nicotine replacement therapy helps deliver nicotine to your body without the negative effects and risks of smoking. Nicotine replacement therapy includes nicotine gum, lozenges, inhalers, nasal sprays, and skin patches. Some may be available over-the-counter and others require a prescription.  Antidepressant medicine helps people abstain from smoking, but how this works is unknown. This medicine is available by prescription.  Nicotinic receptor partial agonist medicine simulates the effect of nicotine in your brain. This medicine is available by prescription. Ask your health care provider for advice about which medicines to use and how to use them based on your health history. Your health care provider will tell you what side effects to look out for if you choose to be on a medicine or therapy. Carefully read the information on the package. Do not use any other product containing nicotine while using a nicotine replacement product.  RELAPSE OR DIFFICULT SITUATIONS Most relapses occur within the first 3 months after quitting. Do not be discouraged if you start smoking again. Remember, most people try several times before finally quitting. You may have symptoms of withdrawal because your body is used to nicotine. You may crave cigarettes, be irritable, feel very hungry, cough often, get headaches, or have difficulty concentrating. The withdrawal symptoms are only temporary. They are strongest  when you first quit, but they will go away within 10-14 days. To reduce the chances of relapse, try to:  Avoid drinking alcohol. Drinking lowers your chances of successfully quitting.  Reduce the amount of caffeine you consume. Once you quit smoking, the amount of caffeine in your body increases and can give you symptoms, such as a rapid heartbeat, sweating, and anxiety.  Avoid smokers because they can make you want to smoke.  Do not let weight gain distract you. Many smokers will gain weight when they quit, usually less than 10 pounds. Eat a healthy diet and stay active. You can always lose the weight gained after you quit.  Find ways to improve your mood other than smoking. FOR MORE INFORMATION  www.smokefree.gov  Document Released: 06/18/2001 Document Revised: 11/08/2013 Document Reviewed: 10/03/2011 Exit[employer] Patient Information 2015 [employer]. This information is not intended to replace advice given to you by your health care provider. Make sure you discuss any questions you have with your health care provider.  

## 2014-04-11 ENCOUNTER — Other Ambulatory Visit: Payer: Self-pay | Admitting: Pulmonary Disease

## 2014-04-15 ENCOUNTER — Encounter: Payer: Self-pay | Admitting: Pulmonary Disease

## 2014-04-15 ENCOUNTER — Ambulatory Visit (INDEPENDENT_AMBULATORY_CARE_PROVIDER_SITE_OTHER): Payer: 59 | Admitting: Pulmonary Disease

## 2014-04-15 VITALS — BP 122/64 | HR 84 | Temp 99.0°F | Ht 72.0 in | Wt 133.6 lb

## 2014-04-15 DIAGNOSIS — J441 Chronic obstructive pulmonary disease with (acute) exacerbation: Secondary | ICD-10-CM

## 2014-04-15 DIAGNOSIS — J439 Emphysema, unspecified: Secondary | ICD-10-CM

## 2014-04-15 DIAGNOSIS — Z23 Encounter for immunization: Secondary | ICD-10-CM

## 2014-04-15 MED ORDER — PREDNISONE 10 MG PO TABS: ORAL_TABLET | ORAL | Status: DC

## 2014-04-15 MED ORDER — ALBUTEROL SULFATE HFA 108 (90 BASE) MCG/ACT IN AERS: INHALATION_SPRAY | RESPIRATORY_TRACT | Status: DC

## 2014-04-15 NOTE — Assessment & Plan Note (Signed)
The patient is having a mild COPD exacerbation that I suspect is associated with his resolving acute on chronic sinusitis, and probably the acute change in the weather.  He has wheezing on exam. I will treat him with a course of prednisone, but I would also like to try him on a different LABA/ICS to see if he has better control.

## 2014-04-15 NOTE — Progress Notes (Signed)
   Subjective:    Patient ID: Francisco [name], male    DOB: 06/25/1946, 69 y.o.   MRN: [id number]  HPI The patient comes in today for followup of his known COPD secondary to emphysema and a component of asthma. He had been doing well, and then developed an episode of acute on chronic sinusitis that was treated by otolaryngology. He finished up antibiotics and did sinus rinses, and is much improved. However, he has had increasing shortness of breath over the last few weeks that has required increased rescue inhaler use. He has had wheezing and dry cough, and even awakens at night with a feeling of chest tightness that resolves with his rescue inhaler. He is not bringing up purulent mucus from his chest.   Review of Systems  Constitutional: Negative for fever and unexpected weight change.  HENT: Negative for congestion, dental problem, ear pain, nosebleeds, postnasal drip, rhinorrhea, sinus pressure, sneezing, sore throat and trouble swallowing.   Eyes: Negative for redness and itching.  Respiratory: Positive for cough, chest tightness, shortness of breath and wheezing.   Cardiovascular: Negative for palpitations and leg swelling.  Gastrointestinal: Negative for nausea and vomiting.  Genitourinary: Negative for dysuria.  Musculoskeletal: Negative for joint swelling.  Skin: Negative for rash.  Neurological: Negative for headaches.  Hematological: Does not bruise/bleed easily.  Psychiatric/Behavioral: Negative for dysphoric mood. The patient is not nervous/anxious.        Objective:   Physical Exam Thin male in no acute distress Nose without purulence or discharge noted Neck without lymphadenopathy or thyromegaly  Chest with diffuse wheezes, but adequate airflow Cardiac exam with rrr, no mrg LE without edema, no cyanosis Alert and oriented, moves all 4.        Assessment & Plan:

## 2014-04-15 NOTE — Patient Instructions (Signed)
Will treat with prednisone for 8 days to get you thru this episode. Stay on spiriva one inhalation each am As a trial, change advair to dulera 200, and take 2 inhalations am and pm everyday.  Rinse mouth well. Will talk with your pharmacy to make sure you have a rescue inhaler available. Will give you the flu shot today. followup with me again in 31mos.

## 2014-04-15 NOTE — Addendum Note (Signed)
Addended by: Inge Rise on: 04/15/2014 10:15 AM   Modules accepted: Orders

## 2014-05-04 ENCOUNTER — Telehealth: Payer: Self-pay | Admitting: Pulmonary Disease

## 2014-05-04 NOTE — Telephone Encounter (Signed)
If he did not see a difference with dulera, ok to go back to advair. It is unclear at this point what is going to keep him controlled other than chronic prednisone, which I would like to avoid unless no other option.  Would like to try one more thing to see if helps.  Give him samples of daliresp 500mg  one a day each day.  It is an anitinflammatory that is different from prednisone.  Take on a full stomach, and let us know if causes upset stomach or diarrhea.  Let us know in 2-3 weeks if better.

## 2014-05-04 NOTE — Telephone Encounter (Signed)
Spoke with Francisco [name]-- since seen 04/15/14 Reports that at last OV 04/15/14, changes to inhalers were made, changed Advair to [facility]-- states that he does not see any improvement in SOB since this change.  Francisco [name] states that he noticed a difference in SOB with the Prednisone taper given 04/15/14, once completed symptoms remained unchanged.  Francisco [name] reports using albuterol hfa frequently d/t increased symptoms.  Francisco [name] wanting to know if he needs to continue [facility] even though he is not seeing much change.  Francisco [name] stats that he has some Advair left at home and would like to know if he needs to go back to this.   Please advise Dr [name]. Thanks.

## 2014-05-04 NOTE — Telephone Encounter (Signed)
Called and spoke to pt. Informed pt of the recs per [facility]. Pt verbalized understanding. Pt stated he would finish the rest of his Dulera and when he calls to update on the daliresp he will update [place] on the dulera also. Pt aware samples are left up front to be picked up. Nothing further needed at this time.

## 2014-05-10 ENCOUNTER — Other Ambulatory Visit: Payer: Self-pay | Admitting: Dermatology

## 2014-05-10 ENCOUNTER — Telehealth: Payer: Self-pay | Admitting: Pulmonary Disease

## 2014-05-10 NOTE — Telephone Encounter (Signed)
Called pt. He reports his breathing is not getting better. He is scheduled to see CDY in the AM. Nothing further needed

## 2014-05-11 ENCOUNTER — Encounter: Payer: Self-pay | Admitting: Internal Medicine

## 2014-05-11 ENCOUNTER — Ambulatory Visit (INDEPENDENT_AMBULATORY_CARE_PROVIDER_SITE_OTHER): Payer: 59 | Admitting: Internal Medicine

## 2014-05-11 VITALS — BP 110/72 | HR 91 | Temp 98.2°F | Ht 72.0 in | Wt 134.6 lb

## 2014-05-11 DIAGNOSIS — F17200 Nicotine dependence, unspecified, uncomplicated: Secondary | ICD-10-CM

## 2014-05-11 DIAGNOSIS — J441 Chronic obstructive pulmonary disease with (acute) exacerbation: Secondary | ICD-10-CM

## 2014-05-11 DIAGNOSIS — Z720 Tobacco use: Secondary | ICD-10-CM

## 2014-05-11 DIAGNOSIS — R0600 Dyspnea, unspecified: Secondary | ICD-10-CM

## 2014-05-11 MED ORDER — LEVALBUTEROL HCL 0.63 MG/3ML IN NEBU
0.6300 mg | INHALATION_SOLUTION | Freq: Once | RESPIRATORY_TRACT | Status: AC
  Administered 2014-05-11: 0.63 mg via RESPIRATORY_TRACT

## 2014-05-11 MED ORDER — AZITHROMYCIN 250 MG PO TABS: ORAL_TABLET | ORAL | Status: DC

## 2014-05-11 NOTE — Assessment & Plan Note (Addendum)
Describes bronchitic exacerbation - nonspecific. He associates with onset of Daliresp making secretion clearance harder.  Plan- neb Xopenex. See if home neb would be useful in future          Sample Dulera 200 as requested, to allow longer trial           Script for zith to start as Zpak, but then change to macrolide anti-inflammatory trial, every M,W, F for a month or so          F/U w Dr [name]

## 2014-05-11 NOTE — Progress Notes (Signed)
Subjective:    Patient ID: Francisco [name], male    DOB: [birth year], 68 y.o.   MRN: [id number] 04/15/14- HPI The patient comes in today for followup of his known COPD secondary to emphysema and a component of asthma. He had been doing well, and then developed an episode of acute on chronic sinusitis that was treated by otolaryngology. He finished up antibiotics and did sinus rinses, and is much improved. However, he has had increasing shortness of breath over the last few weeks that has required increased rescue inhaler use. He has had wheezing and dry cough, and even awakens at night with a feeling of chest tightness that resolves with his rescue inhaler. He is not bringing up purulent mucus from his chest.  05/11/14- Acute OV   68 yoM pipe smoker Followed by Dr [name] for COPD/E with asthma, complicated by hx sinusitis FOLLOWS FOR: [place] pt. Pt c/o chest congestion but is unable to bring up mucus. Pt states he has been using his albuterol hfa more frequently and it is helping break up mucus. Pt just started taking daliresp on 10/31. Pt also c/o increase in SOB. Pt denies CP/tightness.  Not a cold. Daliresp each time seems to loosen mucus so it rattles, but won't come up. Rescue inhaler helps, needed 4x/daily on top of Dulera. Asks sample Dulera to try longer. Mucinex chokes him.   ROS-see HPI Constitutional:   No-   weight loss, night sweats, fevers, chills, fatigue, lassitude. HEENT:   No-  headaches, difficulty swallowing, tooth/dental problems, sore throat,       No-  sneezing, itching, ear ache, nasal congestion, post nasal drip,  CV:  No-   chest pain, orthopnea, PND, swelling in lower extremities, anasarca, dizziness, palpitations Resp: + shortness of breath with exertion or at rest.              +productive cough,  No non-productive cough,  No- coughing up of blood.              No-   change in color of mucus.  + wheezing.   Skin: No-   rash or lesions. GI:  No-   heartburn, indigestion,  abdominal pain, nausea, vomiting,  GU: . MS:  No-   joint pain or swelling.   Neuro-     nothing unusual Psych:  No- change in mood or affect. No depression or anxiety.  No memory loss.     Objective:  OBJ- Physical Exam General- Alert, Oriented, Affect-appropriate, Distress- none acute, +thin Skin- rash-none, lesions- none, excoriation- none Lymphadenopathy- none Head- atraumatic            Eyes- Gross vision intact, PERRLA, conjunctivae and secretions clear            Ears- Hearing, canals-normal            Nose- Clear, no-Septal dev, mucus, polyps, erosion, perforation             Throat- Mallampati III , mucosa clear , drainage- none, tonsils- atrophic Neck- flexible , trachea midline, no stridor , thyroid nl, carotid no bruit Chest - symmetrical excursion , unlabored           Heart/CV- RRR , no murmur , no gallop  , no rub, nl s1 s2                           - JVD- none , edema- none, stasis changes- none,  varices- none           Lung- +distant, unlabored, quiet, wheeze- none, cough- none , dullness-none, rub- none           Chest wall-  Abd-  Br/ Gen/ Rectal- Not done, not indicated Extrem- cyanosis- none, clubbing, none, atrophy- none, strength- nl Neuro- grossly intact to observation         Assessment & Plan:

## 2014-05-11 NOTE — Patient Instructions (Signed)
Neb xop 0.63     Watch today to see how much and for how long this seems to open your chest  Sample Dulera 200 maintenance inhaler for longer trial       2 puffs then rinse mouth, twice daily  Script sent for Azithromycin/ zithromax - take as directed. After the first week, you will be taking it every Monday, Wednesday, Friday, as an airway anti-inflammatory, along with your Daliresp.   Dr [name] will decide with you about longer term options.

## 2014-05-11 NOTE — Assessment & Plan Note (Signed)
Still occasional pipe smoker- encouraged to quit

## 2014-05-17 ENCOUNTER — Encounter: Payer: Self-pay | Admitting: Family Medicine

## 2014-05-17 DIAGNOSIS — Z85828 Personal history of other malignant neoplasm of skin: Secondary | ICD-10-CM | POA: Insufficient documentation

## 2014-05-24 ENCOUNTER — Telehealth: Payer: Self-pay | Admitting: Pulmonary Disease

## 2014-05-24 NOTE — Telephone Encounter (Signed)
If he is doing well on dulera and daliresp, would like for him to stay on these if working.  Ok to give him 3-4 boxes of daliresp, and send prescription for him.

## 2014-05-24 NOTE — Telephone Encounter (Signed)
lmtcb for pt.  

## 2014-05-24 NOTE — Telephone Encounter (Signed)
Pt calling to give update on Dulera and Daliresp.  Pt states that both have been working well to control his breathing symptoms.  Would like to know if he is to stay on the [facility] long term - see phone note 05/04/14. Pt wanting to check with [facility] since he did have issues with Dulera and Dymista in the past.  Pt requesting Rx be sent to [facility] and samples to last until supply is delivered.    Patient requests to be called back on [phone] (M)  Please advise Dr [name]. Thanks.

## 2014-05-25 MED ORDER — ROFLUMILAST 500 MCG PO TABS: 500.0000 ug | ORAL_TABLET | Freq: Every day | ORAL | Status: DC

## 2014-05-25 MED ORDER — MOMETASONE FURO-FORMOTEROL FUM 200-5 MCG/ACT IN AERO: 2.0000 | INHALATION_SPRAY | Freq: Two times a day (BID) | RESPIRATORY_TRACT | Status: DC

## 2014-05-25 NOTE — Telephone Encounter (Signed)
Pt returning call can be reached @ [phone].Francisco [name]

## 2014-05-25 NOTE — Telephone Encounter (Signed)
Pt is aware that I have left him 2 samples of Dulera 200/5 up front for pick up as well. Pt requests we send both Rx's to [facility]. This has been taken care of and nothing more needed from our office.

## 2014-05-25 NOTE — Telephone Encounter (Signed)
lmtbc for pt.  Samples have been left up front for pick up. Will need to find out preferred pharmacy to send rx.

## 2014-05-25 NOTE — Telephone Encounter (Signed)
Pt calling a/b samples of dulera inhaler told pt that samples of daliresp were ready for pick-up, he was looking for samples of dulera inhaler as well please advise can be reached @ [phone].Francisco [name]

## 2014-05-26 ENCOUNTER — Other Ambulatory Visit: Payer: Self-pay | Admitting: Pulmonary Disease

## 2014-05-27 ENCOUNTER — Encounter: Payer: Self-pay | Admitting: Pulmonary Disease

## 2014-05-31 ENCOUNTER — Ambulatory Visit: Payer: 59 | Admitting: Pulmonary Disease

## 2014-06-01 ENCOUNTER — Ambulatory Visit (INDEPENDENT_AMBULATORY_CARE_PROVIDER_SITE_OTHER): Payer: 59 | Admitting: Pulmonary Disease

## 2014-06-01 ENCOUNTER — Encounter: Payer: Self-pay | Admitting: Pulmonary Disease

## 2014-06-01 VITALS — BP 126/72 | HR 80 | Temp 97.4°F | Ht 72.0 in | Wt 134.2 lb

## 2014-06-01 DIAGNOSIS — J438 Other emphysema: Secondary | ICD-10-CM

## 2014-06-01 DIAGNOSIS — J441 Chronic obstructive pulmonary disease with (acute) exacerbation: Secondary | ICD-10-CM

## 2014-06-01 MED ORDER — AZITHROMYCIN 250 MG PO TABS: ORAL_TABLET | ORAL | Status: DC

## 2014-06-01 MED ORDER — PREDNISONE 10 MG PO TABS: ORAL_TABLET | ORAL | Status: DC

## 2014-06-01 NOTE — Patient Instructions (Signed)
Will treat with a course of prednisone, and taper to a dose of 10mg  a day and stay there. Stop daliresp Stay on your azithromycin 3 time a week for now. Try and work on pipe smoking cessation. Will consider allergy testing in the future, and also see if you qualify for a drug study we are doing for [employer].  followup with me again in 4 weeks.

## 2014-06-01 NOTE — Assessment & Plan Note (Signed)
The patient is having another COPD exacerbation despite being treated aggressively with bronchodilators, 3 day a week azithromycin, and also daliresp.  He is not having any acute sinusitis symptoms, and feels this has been doing well for the last 6 months. He denies any anginal-like chest pain, and has had cardiac evaluations in the past. We will certainly need to keep this in mind going forward. Part of the issue is his ongoing pipe smoking, and I've had another conversation with him about the need to totally quit. He is not tolerating daliresp very well from a GI standpoint, and we will discontinue. He has a very good prednisone responder, and we'll therefore treat him with a course of prednisone, and leave him on a chronic dose of 10 mg a day for now. We will need to consider an allergy evaluation in the future since he has a significant asthmatic component to his disease, and will also see if we have any drug studies that he may qualify.

## 2014-06-01 NOTE — Progress Notes (Signed)
   Subjective:    Patient ID: Francisco [name], male    DOB: 12/19/1945, 68 y.o.   MRN: [id number]  HPI Patient comes in today for an acute sick visit. He has known severe COPD with asthmatic component, and has been having frequent acute exacerbations despite staying on a good bronchodilator regimen. Generally, he continues to pipe smoke, and does inhale.  He has been tried on daliresp and 3 days a week azithromycin, but continues to have issues. He is not having any sinusitis issues currently. He has noticed increasing shortness of breath, wheezing, and his cough and mucus is at baseline. He has not had any pleuritic or anginal-like chest pain. His primary issue is increasing dyspnea on exertion, and he is over using his rescue inhaler.   Review of Systems  Constitutional: Negative for fever and unexpected weight change.  HENT: Positive for congestion and postnasal drip. Negative for dental problem, ear pain, nosebleeds, rhinorrhea, sinus pressure, sneezing, sore throat and trouble swallowing.   Eyes: Negative for redness and itching.  Respiratory: Positive for cough, chest tightness, shortness of breath and wheezing.   Cardiovascular: Negative for palpitations and leg swelling.  Gastrointestinal: Negative for nausea and vomiting.  Genitourinary: Negative for dysuria.  Musculoskeletal: Negative for joint swelling.  Skin: Negative for rash.  Neurological: Negative for headaches.  Hematological: Does not bruise/bleed easily.  Psychiatric/Behavioral: Negative for dysphoric mood. The patient is not nervous/anxious.        Objective:   Physical Exam Thin male in no acute distress Nose without purulence or discharge noted Neck without lymphadenopathy or thyromegaly Chest with very diminished breath sounds, scattered expiratory wheezes Cardiac exam with regular rate and rhythm Lower extremities without edema, no cyanosis Alert and oriented, moves all 4 extremities.       Assessment &  Plan:

## 2014-06-14 ENCOUNTER — Telehealth: Payer: Self-pay | Admitting: Pulmonary Disease

## 2014-06-14 MED ORDER — ALBUTEROL SULFATE HFA 108 (90 BASE) MCG/ACT IN AERS: INHALATION_SPRAY | RESPIRATORY_TRACT | Status: DC

## 2014-06-14 NOTE — Telephone Encounter (Signed)
Pt just seen 11.25.15 by [place] Ventolin last refilled 04/15/14 to local pharmacy  Left detailed message on named voicemail informing pt that refills have been sent to [facility] as requested Encouraged pt to call the office with any further questions/concerns  Rx sent Nothing further needed; will sign off.

## 2014-06-23 ENCOUNTER — Ambulatory Visit: Payer: 59 | Admitting: Pulmonary Disease

## 2014-06-25 ENCOUNTER — Other Ambulatory Visit: Payer: Self-pay | Admitting: Pulmonary Disease

## 2014-06-28 ENCOUNTER — Encounter: Payer: Self-pay | Admitting: Pulmonary Disease

## 2014-06-28 ENCOUNTER — Ambulatory Visit (INDEPENDENT_AMBULATORY_CARE_PROVIDER_SITE_OTHER): Payer: 59 | Admitting: Pulmonary Disease

## 2014-06-28 VITALS — BP 132/62 | HR 91 | Temp 97.0°F | Ht 72.0 in | Wt 135.6 lb

## 2014-06-28 DIAGNOSIS — J438 Other emphysema: Secondary | ICD-10-CM

## 2014-06-28 MED ORDER — AZITHROMYCIN 250 MG PO TABS: ORAL_TABLET | ORAL | Status: DC

## 2014-06-28 MED ORDER — PREDNISONE 10 MG PO TABS: ORAL_TABLET | ORAL | Status: DC

## 2014-06-28 NOTE — Progress Notes (Signed)
   Subjective:    Patient ID: Francisco [name], male    DOB: 21-Sep-1945, 68 y.o.   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 He is on maximal bronchodilator therapy, including 3 times a week azithromycin and chronic low dose prednisone. He had been doing fairly well on this and told this weekend, and had a few days of significant worsening dyspnea on exertion where he had to overuse his rescue inhaler. This has now settled down, but he has not gotten back to his usual baseline. He denies any significant chest congestion, and his cough and mucus are at baseline. He is not having any purulence. Unfortunately, he continues to smoke his pipe, and admits that he is a heavy inhaler.   Review of Systems  Constitutional: Negative for fever and unexpected weight change.  HENT: Negative for congestion, dental problem, ear pain, nosebleeds, postnasal drip, rhinorrhea, sinus pressure, sneezing, sore throat and trouble swallowing.   Eyes: Negative for redness and itching.  Respiratory: Positive for cough, chest tightness, shortness of breath and wheezing.   Cardiovascular: Negative for palpitations and leg swelling.  Gastrointestinal: Negative for nausea and vomiting.  Genitourinary: Negative for dysuria.  Musculoskeletal: Negative for joint swelling.  Skin: Negative for rash.  Neurological: Negative for headaches.  Hematological: Does not bruise/bleed easily.  Psychiatric/Behavioral: Negative for dysphoric mood. The patient is not nervous/anxious.        Objective:   Physical Exam Thin male in no acute distress Nose without purulence or discharge noted Neck without lymphadenopathy or thyromegaly Chest with very diminished breath sounds, and prominent upper airway wheezing. Cardiac exam with regular rate and rhythm Lower extremities without edema, no cyanosis Alert and oriented, moves all 4  extremities.       Assessment & Plan:

## 2014-06-28 NOTE — Assessment & Plan Note (Addendum)
The patient continues to have issues intermittently, but overall feels that he is much better than a few months ago. He thinks the three times a week azithromycin has really helped him, and therefore I would like for him to continue on this. I have explained that he is going to have good and bad days, and the goal will be to minimize the "bad days".  He is on maximal therapy for his COPD, and I really see nothing to add other than changing him to nebulized bronchodilators. He has very severe lung disease, and this may simply be the natural history of his disease process leading to increased dyspnea on exertion. We checked his ambulatory oximetry today, and he does not desaturate below 90%, but gets extremely winded. I always worry about patients with severe lung disease and known coronary disease when they are having increased shortness of breath with exertion, wondering if this may represent worsening coronary disease. He has had a nuclear stress study in 2013 that was normal. We will still need to keep this in mind going forward, since he can easily have progressive coronary disease. For now, I would like to continue his current meds since he thinks things have settled down over the last day or so. I have told him that he can do a prednisone pulse over the holidays if his condition worsens.

## 2014-06-28 NOTE — Patient Instructions (Signed)
Work on stopping pipe smoking Stay on prednisone 10mg  a day for now, but can bump up as we discuss for a taper if not doing well. Stay on azithromycin 3 days a week followup with me again in 40mos, but call if having increased difficulties.

## 2014-07-22 ENCOUNTER — Ambulatory Visit (INDEPENDENT_AMBULATORY_CARE_PROVIDER_SITE_OTHER): Payer: 59 | Admitting: Family Medicine

## 2014-07-22 ENCOUNTER — Encounter: Payer: Self-pay | Admitting: Family Medicine

## 2014-07-22 DIAGNOSIS — M25472 Effusion, left ankle: Secondary | ICD-10-CM

## 2014-07-22 DIAGNOSIS — E785 Hyperlipidemia, unspecified: Secondary | ICD-10-CM | POA: Insufficient documentation

## 2014-07-22 DIAGNOSIS — B351 Tinea unguium: Secondary | ICD-10-CM | POA: Insufficient documentation

## 2014-07-22 HISTORY — DX: Effusion, left ankle: M25.472

## 2014-07-22 NOTE — Assessment & Plan Note (Signed)
We had a long discussion about lipids. If patient did not have his severe COPD, I would certainly recommend a statin and aspirin all other things considered. 10 year risk of 13% for MI/CVA but suspicious COPD may have progressed or ended his life by that point. Patient's life ending by MI actually seems somewhat desirable to him vs. Long death and continued loss of quality of life with his COPD. Ultimately, we opted not for statin or aspirin. He is going to discuss with pulmonology as well. It certainly is possible shortness of breath could be cardiac but apparently stress testing did not lead toward cath in 2013.

## 2014-07-22 NOTE — Assessment & Plan Note (Signed)
Left foot/ankle. Would be suspicious of injury related but patient denies falls or injury. Unilateral and would be concerning for DVT but equal calf size and no calf pain. We discussed if any further changes or pain, would consider imaging (ankle films if pain) or venous duplex if increased swelling. Unclear etiology at this point

## 2014-07-22 NOTE — Patient Instructions (Signed)
Obviously would love for you to quit smoking.   I am concerned about your cardiac risk with your cholesterol, smoking, not being on aspirin. We discussed with your COPD this is a difficult balance deciding whether to start these. Please continue to consider and perhaps bring this up with [name] in regards to life expectancy.   Unclear what is causing left ankle swelling. If you had pain, I would x-ray this. If you had worsening swelling or calf pain, I would likely do an ultrasound on the leg. Please come back to see [place].

## 2014-07-22 NOTE — Progress Notes (Signed)
Francisco Reddish, MD Phone: [phone]  Subjective:  Patient presents today to establish care with me as their new primary care provider. Patient was formerly a patient of Dr. Leanne Chang. Chief complaint-noted.   Hyperlipidemia-poor control  Lab Results  Component Value Date   LDLCALC 108* 02/25/2014  10 year risk 13% On statin: no Regular exercise: no, limited by COPD ROS- no chest pain. SOB at baseline per COPD. No myalgias  Some swelling in left foot/low ankle over last month after some medication changes. Breathing improved with changes. No swelling right foot. No injury or fall or twist. Stable swelling. Tingling occasionally. No calf pain. Goes down at night. No history of surgery on that knee or leg.  ROS-no pain in calf or calf swelling.   The following were reviewed and entered/updated in epic: Past Medical History  Diagnosis Date  . Onychomycosis     Dr. Judi Cong  . Chest x-ray abnormality   . COPD (chronic obstructive pulmonary disease)     PFT's rec Jul 17, 2009  . Chronic rhinitis     -Sinus Ct 08/01/2009 >> Bilateral maxillary sinusitis with some mucosal thickeningin the sphenoid and frontal sinuses as well with air fluid levels present -chronic rhinitis flyer Aug 04, 2009   Patient Active Problem List   Diagnosis Date Noted  . TOBACCO USE 08/23/2008    Priority: High  . COPD (chronic obstructive pulmonary disease) with emphysema 08/23/2008    Priority: High  . Hyperlipidemia 07/22/2014    Priority: Medium  . Onychomycosis 07/22/2014    Priority: Low  . Left ankle swelling 07/22/2014    Priority: Low  . History of skin cancer 05/17/2014    Priority: Low  . Chronic rhinitis 10/28/2012    Priority: Low  . Pulmonary nodule 09/23/2011    Priority: Low  . History of colonic polyps 08/23/2008    Priority: Low  . COPD exacerbation 09/08/2013   Past Surgical History  Procedure Laterality Date  . Appendectomy    . Tonsillectomy      Family History  Problem  Relation Age of Onset  . Heart disease Mother     CABG in her 27s, nonsmoker  . Cancer Mother   . Stroke Father   . Heart disease Father     Died of MI at age 8, smoker  . Hepatitis Sister   . Coronary artery disease Other     male 1st degree relative <60    Medications- reviewed and updated Current Outpatient Prescriptions  Medication Sig Dispense Refill  . azithromycin (ZITHROMAX) 250 MG tablet One tablet every M, W, F 12 tablet 3  . mometasone-formoterol (DULERA) 200-5 MCG/ACT AERO Inhale 2 puffs into the lungs 2 (two) times daily. 3 Inhaler 2  . montelukast (SINGULAIR) 10 MG tablet TAKE 1 TABLET (10 MG TOTAL) BY MOUTH AT BEDTIME. 30 tablet 6  . predniSONE (DELTASONE) 10 MG tablet 1 tablet daily 30 tablet 3  . SPIRIVA HANDIHALER 18 MCG inhalation capsule Inhale the contents of 1  capsule via HandiHaler  daily 90 capsule 3  . terbinafine (LAMISIL) 250 MG tablet Take 250 mg by mouth See admin instructions. Take 1 tablet daily for 7 days every month    . albuterol (VENTOLIN HFA) 108 (90 BASE) MCG/ACT inhaler INHALE 2 PUFFS INTO THE LUNGS EVERY 6 HOURS AS NEEDED FOR WHEEZING (Patient not taking: Reported on 07/22/2014) 54 g 3  . fluticasone (FLONASE) 50 MCG/ACT nasal spray Place 2 sprays into the nose daily. (Patient not taking:  Reported on 07/22/2014) 48 g 3   No current facility-administered medications for this visit.    Allergies-reviewed and updated No Known Allergies  History   Social History  . Marital Status: Married    Spouse Name: N/A    Number of Children: 2  . Years of Education: N/A   Occupational History  .     Social History Main Topics  . Smoking status: Current Every Day Smoker -- 2.00 packs/day for 10 years    Types: Pipe, Cigarettes  . Smokeless tobacco: None     Comment: Smokes pipe up to 3 times a day  . Alcohol Use: 10.8 oz/week    18 Cans of beer per week     Comment: daily  . Drug Use: No  . Sexual Activity: None   Other Topics Concern  .  None   Social History Narrative   Married (wife patient of Dr. Yong Channel) with children (2-son and daughter). 1 grandson.       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       Hobbies: fishing, projects at home    ROS--See HPI   Objective: BP 130/70 mmHg  Temp(Src) 97.8 F (36.6 C)  Wt 139 lb (63.05 kg) Gen: NAD, resting comfortably in chair CV: RRR no murmurs rubs or gallops, somewhat distant Lungs: prolonged expiratory phase, no crackles, occasional wheeze Abdomen: soft/nontender/nondistended/normal bowel sounds.  Ext: right foot and ankle with trace edema Left foot with 1+ edema pitting around the ankle. 2+ DP pulses. No erythema. Range of motion is full in all directions. Strength is 5/5 in all directions.Stable ligaments.  No calf swelling or pain-equal calf pain.  Skin: warm, dry Neuro: grossly normal, moves all extremities, PERRLA   Assessment/Plan:  Hyperlipidemia We had a long discussion about lipids. If patient did not have his severe COPD, I would certainly recommend a statin and aspirin all other things considered. 10 year risk of 13% for MI/CVA but suspicious COPD may have progressed or ended his life by that point. Patient's life ending by MI actually seems somewhat desirable to him vs. Long death and continued loss of quality of life with his COPD. Ultimately, we opted not for statin or aspirin. He is going to discuss with pulmonology as well. It certainly is possible shortness of breath could be cardiac but apparently stress testing did not lead toward cath in 2013.     Left ankle swelling Left foot/ankle. Would be suspicious of injury related but patient denies falls or injury. Unilateral and would be concerning for DVT but equal calf size and no calf pain. We discussed if any further changes or pain, would consider imaging (ankle films if pain) or venous duplex if increased swelling. Unclear etiology at this point   >50% of 30 minute  office visit was spent on counseling(goals for life, lipids, consideration of treating cholesterol or further investigation cardiac cause of shortness of breath) and coordination of care. i also advised patient to quit smoking but he will continue at present. We also discussed colonoscopy benefit may be low with potential limited life expectancy.   Return precautions advised. 6 month planned or prn

## 2014-07-25 ENCOUNTER — Encounter: Payer: Self-pay | Admitting: Family Medicine

## 2014-08-02 ENCOUNTER — Other Ambulatory Visit: Payer: Self-pay | Admitting: Pulmonary Disease

## 2014-08-08 ENCOUNTER — Telehealth: Payer: Self-pay | Admitting: Pulmonary Disease

## 2014-08-08 NOTE — Telephone Encounter (Signed)
lmtcb for pt's wife.

## 2014-08-08 NOTE — Telephone Encounter (Signed)
[phone] wife calling back

## 2014-08-08 NOTE — Telephone Encounter (Signed)
Spoke with pt to get his new insurance member id number which is [id number].  Called [employer] and spoke with Alyssa to initiate PA for these two medications.  They are being faxed to office.  Will look out for these.

## 2014-08-12 ENCOUNTER — Ambulatory Visit: Payer: 59 | Admitting: Pulmonary Disease

## 2014-08-12 NOTE — Telephone Encounter (Signed)
Forms were received. They have been filled out and faxed in. Will route message to Mindy to follow up on.

## 2014-08-12 NOTE — Telephone Encounter (Signed)
Pt wants to speak to the nurse [phone]

## 2014-08-12 NOTE — Telephone Encounter (Signed)
They will turn it down, like they have everyone else.  Let them know Have to look at symbicort, breo, advair.  Would prefer to avoid advair if possible.

## 2014-08-12 NOTE — Telephone Encounter (Signed)
Spoke with patient wife, states that she spoke with [facility] and they confirmed that the Ventolin has been approved but the [facility] was denied coverage. An appeals will have to be made to get coverage of Dulera. Pt wife states that we need to call the Appeals Dept with [facility] @ [phone] and initiate an appeals and request forms to be faxed for Dr [name] to fill out. Forms will then need to be faxed to [phone].  Pt wife reports having a new insurance  -  [facility] [employer] Patient ID #[id number]  Please advise Dr [name] if you wish to move forward with the Appeals Process or if you would like to try an alternative. Thanks.

## 2014-08-15 NOTE — Telephone Encounter (Signed)
Spoke with pt's wife. Advised her that she will need to contact their insurance company and see if these inhalers are covered. She agreed and verbalized understanding. Will route to Mindy to follow up on.

## 2014-08-15 NOTE — Telephone Encounter (Signed)
Called and spoke to pt's wife, Lilly. Lilly stated she called [employer] and was told Symbicort and Memory Dance are both covered. Ralph Leyden is requesting a sample of the chosen med for pt to try out before a script is sent.   Lilly please advise.

## 2014-08-15 NOTE — Telephone Encounter (Signed)
Called and spoke to pt's wife. Breo samples have been left up front for pick up. Pt's wife is aware to call back near the end of samples to let us know how it goes. Nothing further needed.

## 2014-08-15 NOTE — Telephone Encounter (Signed)
Pt returning call can be reached @ [phone].Francisco [name]

## 2014-08-15 NOTE — Telephone Encounter (Signed)
Ok to give him samples of breo 100, one inhalation each am.  Give him enough for a month, and he can let us know how it goes.

## 2014-08-18 ENCOUNTER — Telehealth: Payer: Self-pay | Admitting: Pulmonary Disease

## 2014-08-18 MED ORDER — TRIAMTERENE-HCTZ 37.5-25 MG PO TABS: 1.0000 | ORAL_TABLET | Freq: Every day | ORAL | Status: DC

## 2014-08-18 NOTE — Telephone Encounter (Signed)
Called spoke with pt. He reports left foot swollen. Pt is able to wear a show. He reports he thought [facility] [facility] should know and see if he has any recs. Pt reports it is swelling daily now. Please advise [place] thanks  No Known Allergies

## 2014-08-18 NOTE — Telephone Encounter (Signed)
Swelling on one side can be indicative of a blood clot or just fluid retention.  Would like to try maxzide 37.5/25 one each am, ONLY IF HE HAS SWELLING FIRST THING IN AM UPON ARISING.  IF HE DOES NOT HAVE SWELLING FIRST THING IN AM, DO NOT TAKE. Keep feet elevated during day if sitting down. If continues, may have to ultrasound his leg to make sure no clots.  #30, no fills.

## 2014-08-18 NOTE — Telephone Encounter (Signed)
Called pt and aware of recs. Medication sent.

## 2014-08-23 ENCOUNTER — Telehealth: Payer: Self-pay | Admitting: *Deleted

## 2014-08-23 MED ORDER — AZITHROMYCIN 250 MG PO TABS: ORAL_TABLET | ORAL | Status: DC

## 2014-08-23 MED ORDER — PREDNISONE 10 MG PO TABS: ORAL_TABLET | ORAL | Status: DC

## 2014-08-23 MED ORDER — MONTELUKAST SODIUM 10 MG PO TABS: 10.0000 mg | ORAL_TABLET | Freq: Every day | ORAL | Status: DC

## 2014-08-23 NOTE — Telephone Encounter (Signed)
Received fax from [facility] requesting 90 day supply on Singular, Zithromax, Prednisone.  Ok to send 90 day supply on these medications?  Please advise.

## 2014-08-23 NOTE — Telephone Encounter (Signed)
These are med that we fill ongoing.

## 2014-08-23 NOTE — Telephone Encounter (Signed)
Rx's have been sent in per [facility]. Nothing further is needed.

## 2014-08-29 ENCOUNTER — Telehealth: Payer: Self-pay | Admitting: *Deleted

## 2014-08-29 MED ORDER — FLUTICASONE PROPIONATE 50 MCG/ACT NA SUSP: 2.0000 | Freq: Every day | NASAL | Status: DC

## 2014-08-29 NOTE — Telephone Encounter (Signed)
Ok to fill 

## 2014-08-29 NOTE — Telephone Encounter (Signed)
Received a fax from [facility] to refill Flonase.  Flonase has not been refilled since 2013 and I do not see in the [facility] notes where it indicates patient should still be taking this.  Ok to refill for 90 day supply as requested?  Please advise.

## 2014-08-29 NOTE — Telephone Encounter (Signed)
Rx has been sent in. 

## 2014-09-27 ENCOUNTER — Encounter: Payer: Self-pay | Admitting: Pulmonary Disease

## 2014-09-27 ENCOUNTER — Ambulatory Visit (HOSPITAL_COMMUNITY): Payer: Medicare Other | Attending: Pulmonary Disease | Admitting: Cardiology

## 2014-09-27 ENCOUNTER — Other Ambulatory Visit (HOSPITAL_COMMUNITY): Payer: Self-pay | Admitting: *Deleted

## 2014-09-27 ENCOUNTER — Other Ambulatory Visit (INDEPENDENT_AMBULATORY_CARE_PROVIDER_SITE_OTHER): Payer: Medicare Other

## 2014-09-27 ENCOUNTER — Ambulatory Visit (INDEPENDENT_AMBULATORY_CARE_PROVIDER_SITE_OTHER): Payer: Medicare Other | Admitting: Pulmonary Disease

## 2014-09-27 VITALS — BP 120/72 | HR 86 | Temp 97.6°F | Ht 72.0 in | Wt 138.4 lb

## 2014-09-27 DIAGNOSIS — J438 Other emphysema: Secondary | ICD-10-CM | POA: Diagnosis not present

## 2014-09-27 DIAGNOSIS — M7989 Other specified soft tissue disorders: Secondary | ICD-10-CM | POA: Diagnosis not present

## 2014-09-27 DIAGNOSIS — M25472 Effusion, left ankle: Secondary | ICD-10-CM

## 2014-09-27 LAB — BASIC METABOLIC PANEL
BUN: 15 mg/dL (ref 6–23)
CO2: 34 mEq/L — ABNORMAL HIGH (ref 19–32)
Calcium: 9.6 mg/dL (ref 8.4–10.5)
Chloride: 90 mEq/L — ABNORMAL LOW (ref 96–112)
Creatinine, Ser: 0.96 mg/dL (ref 0.40–1.50)
GFR: 83.03 mL/min (ref >60.00)
GLUCOSE: 110 mg/dL — AB (ref 70–99)
POTASSIUM: 4 meq/L (ref 3.5–5.1)
Sodium: 132 mEq/L — ABNORMAL LOW (ref 135–145)

## 2014-09-27 MED ORDER — FLUTICASONE FUROATE-VILANTEROL 200-25 MCG/INH IN AEPB: 1.0000 | INHALATION_SPRAY | Freq: Every day | RESPIRATORY_TRACT | Status: DC

## 2014-09-27 NOTE — Patient Instructions (Signed)
Will check bloodwork today for your potassium and also an allergy panel Stay on prednisone 10mg  per day, and can increase for acute flares.  If this is happening frequently, I need to know Will schedule for ultrasound of left leg to make sure no clots present.  Will call you with results. Work on stopping pipe smoking.  followup with me again in 21mos.

## 2014-09-27 NOTE — Assessment & Plan Note (Signed)
The patient takes as needed diuretic for his left lower extremity swelling, but some weeks has to take on a regular basis. It is unclear how much of this is related to venous insufficiency, chronic prednisone, cor pulmonale, or is it possible that he could have a left lower extremity DVT. I would like to schedule him for an ultrasound of his left lower extremity, and will also check his bmet today in light of cramping that he is having since being on the diuretic.

## 2014-09-27 NOTE — Progress Notes (Signed)
   Subjective:    Patient ID: Francisco [name], male    DOB: [birth year], 69 y.o.   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 He continues to have recurrent exacerbations, despite being on 3 day a week azithromycin and now chronic prednisone. He is needing a bump in therapy about every 20 days, but he only does the increase for about 4 days and then goes back to his 10 mg a day.  He has never had an allergy evaluation. He is also having left lower extremity swelling, and was started on an as needed diuretic at the last visit. He feels that he is having some cramping, and we will need to check his potassium and renal function. He has not had a lower extremity ultrasound either.   Review of Systems  Constitutional: Negative for fever, chills, activity change, appetite change and unexpected weight change.  HENT: Positive for congestion and postnasal drip. Negative for dental problem, ear pain, nosebleeds, rhinorrhea, sinus pressure, sneezing, sore throat, trouble swallowing and voice change.   Eyes: Negative for redness, itching and visual disturbance.  Respiratory: Positive for cough, shortness of breath and wheezing. Negative for choking and chest tightness.   Cardiovascular: Positive for leg swelling. Negative for chest pain and palpitations.  Gastrointestinal: Negative for nausea, vomiting and abdominal pain.  Genitourinary: Negative for dysuria and difficulty urinating.  Musculoskeletal: Negative for joint swelling and arthralgias.  Skin: Negative for rash.  Neurological: Negative for headaches.  Hematological: Does not bruise/bleed easily.  Psychiatric/Behavioral: Negative for behavioral problems, confusion and dysphoric mood. The patient is not nervous/anxious.        Objective:   Physical Exam Thin male in no acute distress Nose without purulence or discharge noted Neck without  lymphadenopathy or thyromegaly Chest with very diminished breath sounds, no active wheezing Cardiac exam with regular rate and rhythm Lower extremities without edema on the right, mild ankle edema on the left. Neurologically he is alert and oriented and moves all 4 extremities.       Assessment & Plan:

## 2014-09-27 NOTE — Progress Notes (Signed)
Left lower venous duplex performed

## 2014-09-27 NOTE — Assessment & Plan Note (Signed)
The patient has very severe airflow obstruction that is primarily related to emphysema, but I also think he has an asthmatic component to his disease based on treating him for many years. He is currently prednisone dependent in order to prevent recurrent exacerbations, and we will try to minimize his dose is much as possible. I would like to check an allergy panel to make sure there is not something else that we can do to limit his acute exacerbations. He has are even tried on daliresp rest, and currently is on azithromycin 3 days a week which he thinks has helped. Unfortunately, he continues to smoke his pipe and inhales, and is not willing to quit.

## 2014-09-28 ENCOUNTER — Other Ambulatory Visit: Payer: Self-pay | Admitting: *Deleted

## 2014-09-28 LAB — ALLERGY FULL PROFILE
Allergen, D pternoyssinus,d7: <0.1 kU/L
Allergen,Goose feathers, e70: <0.1 kU/L
Alternaria Alternata: <0.1 kU/L
Bahia Grass: <0.1 kU/L
Bermuda Grass: <0.1 kU/L
Box Elder IgE: <0.1 kU/L
Candida Albicans: <0.1 kU/L
Curvularia lunata: <0.1 kU/L
D. farinae: <0.1 kU/L
Dog Dander: <0.1 kU/L
Fescue: <0.1 kU/L
G005 Rye, Perennial: <0.1 kU/L
Goldenrod: <0.1 kU/L
Helminthosporium halodes: <0.1 kU/L
House Dust Hollister: <0.1 kU/L
IGE (IMMUNOGLOBULIN E), SERUM: 2 kU/L (ref <115)
Lamb's Quarters: <0.1 kU/L
Stemphylium Botryosum: <0.1 kU/L
Sycamore Tree: <0.1 kU/L

## 2014-09-28 MED ORDER — TRIAMTERENE-HCTZ 37.5-25 MG PO TABS: 1.0000 | ORAL_TABLET | Freq: Every day | ORAL | Status: DC | PRN

## 2014-10-03 ENCOUNTER — Telehealth: Payer: Self-pay | Admitting: Pulmonary Disease

## 2014-10-03 MED ORDER — BUDESONIDE-FORMOTEROL FUMARATE 160-4.5 MCG/ACT IN AERO: 2.0000 | INHALATION_SPRAY | Freq: Two times a day (BID) | RESPIRATORY_TRACT | Status: DC

## 2014-10-03 NOTE — Telephone Encounter (Signed)
Spoke with the pt  He states that since starting on Breo on 09/27/14 he has had increased need for albuterol inhaler  He has been using rescue inhaler 7-8 x per day  Please advise thanks!

## 2014-10-03 NOTE — Telephone Encounter (Signed)
Can switch him to the only other med his insurance covers symbicort [id number] 2 bid.

## 2014-10-03 NOTE — Telephone Encounter (Signed)
Spoke with the pt and notified of recs per [facility]  He verbalized understanding  Nothing further needed Rx was sent to pharm

## 2014-10-10 ENCOUNTER — Ambulatory Visit (INDEPENDENT_AMBULATORY_CARE_PROVIDER_SITE_OTHER): Payer: Medicare Other | Admitting: Internal Medicine

## 2014-10-10 ENCOUNTER — Encounter: Payer: Self-pay | Admitting: Internal Medicine

## 2014-10-10 VITALS — BP 126/80 | HR 96 | Temp 97.9°F | Resp 20 | Ht 72.0 in | Wt 139.0 lb

## 2014-10-10 DIAGNOSIS — H6123 Impacted cerumen, bilateral: Secondary | ICD-10-CM

## 2014-10-10 NOTE — Progress Notes (Signed)
Pre visit review using our clinic review tool, if applicable. No additional management support is needed unless otherwise documented below in the visit note. 

## 2014-10-10 NOTE — Patient Instructions (Signed)
Cerumen Impaction °A cerumen impaction is when the wax in your ear forms a plug. This plug usually causes reduced hearing. Sometimes it also causes an earache or dizziness. Removing a cerumen impaction can be difficult and painful. The wax sticks to the ear canal. The canal is sensitive and bleeds easily. If you try to remove a heavy wax buildup with a cotton tipped swab, you may push it in further. °Irrigation with water, suction, and small ear curettes may be used to clear out the wax. If the impaction is fixed to the skin in the ear canal, ear drops may be needed for a few days to loosen the wax. People who build up a lot of wax frequently can use ear wax removal products available in your local drugstore. °SEEK MEDICAL CARE IF:  °You develop an earache, increased hearing loss, or marked dizziness. °Document Released: 08/01/2004 Document Revised: 09/16/2011 Document Reviewed: 09/21/2009 °ExitCare® Patient Information ©2015 [employer]. This information is not intended to replace advice given to you by your health care provider. Make sure you discuss any questions you have with your health care provider. ° °

## 2014-10-10 NOTE — Progress Notes (Signed)
Subjective:    Patient ID: Francisco [name], male    DOB: [birth year], 69 y.o.   MRN: [id number]  HPI  69 year old patient who presents with a chief complaint of bilateral hearing loss.  He states that he requires irrigation of his canals almost on an annual basis.  He has been unsuccessful with the home irrigation  Past Medical History  Diagnosis Date  . Onychomycosis     Dr. Judi Cong  . Chest x-ray abnormality   . COPD (chronic obstructive pulmonary disease)     PFT's rec Jul 17, 2009  . Chronic rhinitis     -Sinus Ct 08/01/2009 >> Bilateral maxillary sinusitis with some mucosal thickeningin the sphenoid and frontal sinuses as well with air fluid levels present -chronic rhinitis flyer Aug 04, 2009    History   Social History  . Marital Status: Married    Spouse Name: N/A  . Number of Children: 2  . Years of Education: N/A   Occupational History  .     Social History Main Topics  . Smoking status: Current Every Day Smoker -- 2.00 packs/day for 10 years    Types: Pipe, Cigarettes  . Smokeless tobacco: Not on file     Comment: Smokes pipe up to 2-3 times a day  . Alcohol Use: 10.8 oz/week    18 Cans of beer per week     Comment: daily  . Drug Use: No  . Sexual Activity: Not on file   Other Topics Concern  . Not on file   Social History Narrative   Married (wife Katy Apo patient of Dr. Yong Channel) with children (2-son and daughter). 1 grandson.       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       Hobbies: fishing, projects at home    Past Surgical History  Procedure Laterality Date  . Appendectomy    . Tonsillectomy      Family History  Problem Relation Age of Onset  . Heart disease Mother     CABG in her 55s, nonsmoker  . Cancer Mother   . Stroke Father   . Heart disease Father     Died of MI at age 15, smoker  . Hepatitis Sister   . Coronary artery disease Other     male 1st degree relative <60    No Known  Allergies  Current Outpatient Prescriptions on File Prior to Visit  Medication Sig Dispense Refill  . albuterol (VENTOLIN HFA) 108 (90 BASE) MCG/ACT inhaler INHALE 2 PUFFS INTO THE LUNGS EVERY 6 HOURS AS NEEDED FOR WHEEZING 54 g 3  . azithromycin (ZITHROMAX) 250 MG tablet One tablet every M, W, F 36 tablet 3  . budesonide-formoterol (SYMBICORT) 160-4.5 MCG/ACT inhaler Inhale 2 puffs into the lungs 2 (two) times daily. 1 Inhaler 5  . fluticasone (FLONASE) 50 MCG/ACT nasal spray Place 2 sprays into both nostrils daily. 48 g 3  . montelukast (SINGULAIR) 10 MG tablet Take 1 tablet (10 mg total) by mouth at bedtime. 90 tablet 3  . predniSONE (DELTASONE) 10 MG tablet 1 tablet daily 90 tablet 3  . SPIRIVA HANDIHALER 18 MCG inhalation capsule Inhale the contents of 1  capsule via HandiHaler  daily 90 capsule 3  . terbinafine (LAMISIL) 250 MG tablet Take 250 mg by mouth See admin instructions. Take 1 tablet daily for 7 days every month    . triamterene-hydrochlorothiazide (MAXZIDE-25) 37.5-25 MG per tablet Take 1 tablet by  mouth daily as needed. Use sparingly 30 tablet 0   No current facility-administered medications on file prior to visit.    BP 126/80 mmHg  Pulse 96  Temp(Src) 97.9 F (36.6 C) (Oral)  Resp 20  Ht 6' (1.829 m)  Wt 139 lb (63.05 kg)  BMI 18.85 kg/m2  SpO2 96%     Review of Systems  HENT: Positive for hearing loss and rhinorrhea.        Objective:   Physical Exam  HENT:  Bilateral cerumen impactions          Assessment & Plan:   Bilateral cerumen impactions.  Both canals irrigated until clear

## 2014-10-11 ENCOUNTER — Telehealth: Payer: Self-pay | Admitting: Family Medicine

## 2014-10-11 ENCOUNTER — Telehealth: Payer: Self-pay | Admitting: Pulmonary Disease

## 2014-10-11 MED ORDER — MOMETASONE FURO-FORMOTEROL FUM 200-5 MCG/ACT IN AERO
2.0000 | INHALATION_SPRAY | Freq: Two times a day (BID) | RESPIRATORY_TRACT | Status: DC
Start: 2014-10-11 — End: 2014-12-13

## 2014-10-11 NOTE — Telephone Encounter (Signed)
Will put symbicort on pt's allergy list, listing it as ineffective.  Will send a new rx to pharmacy for [place], which will prompt a PA initiation.  Will hold this message in triage to look out for.

## 2014-10-11 NOTE — Telephone Encounter (Signed)
emmi emailed °

## 2014-10-11 NOTE — Telephone Encounter (Signed)
Spoke with pt, states he's been on symbicort X7 days- states it's not working well.  Using rescue inhaler 7-8 puffs daily.   Pt is wanting to know if this is enough to try and appeal the dulera denial.    KC are you ok with appealing the dulera since breo and symbicort did not work?  Per last telephone note regarding this, symbicort was the only other alternative medication.  Thanks!

## 2014-10-11 NOTE — Telephone Encounter (Signed)
Go ahead and start whatever form we have to fill out.

## 2014-10-12 NOTE — Telephone Encounter (Signed)
No PA form received yet  Called [facility] and spoke with the pharmacist  PA number is [phone]  Pt has [place] ID number [id number]  Called for PA and was advised that med was already denied and it has not been long enough to try another PA  I advised that the Symbicort is not working for the pt and he needs the [facility]  Rep advised me all we can do at this point is fax a letter of medical necc to his insurance company-the fax number is [phone]  [facility], please advise if you want to appeal thanks

## 2014-10-13 ENCOUNTER — Telehealth: Payer: Self-pay | Admitting: Pulmonary Disease

## 2014-10-13 NOTE — Telephone Encounter (Signed)
Duplicate msg.Francisco [name]

## 2014-10-13 NOTE — Telephone Encounter (Signed)
Pt calling to check on status of PA.Hillery Hunter

## 2014-10-14 MED ORDER — MOMETASONE FURO-FORMOTEROL FUM 200-5 MCG/ACT IN AERO
2.0000 | INHALATION_SPRAY | Freq: Two times a day (BID) | RESPIRATORY_TRACT | Status: DC
Start: 2014-10-14 — End: 2014-12-13

## 2014-10-14 NOTE — Telephone Encounter (Signed)
Pt calling again about PA please call him back@ [phone] this is about his third time calling again.Francisco [name]

## 2014-10-14 NOTE — Telephone Encounter (Signed)
Letter written

## 2014-10-14 NOTE — Telephone Encounter (Signed)
Spoke with pt, he is requesting samples of dulera while we work on the [employer] for [employer].  These have been placed up front.

## 2014-10-14 NOTE — Telephone Encounter (Signed)
I have faxed note to appeals. Will await decision.

## 2014-10-17 ENCOUNTER — Telehealth: Payer: Self-pay | Admitting: Pulmonary Disease

## 2014-10-17 NOTE — Telephone Encounter (Signed)
PA resent through cover my meds for Dulera 200 mcg. Request was expedited for review (24) hrs. Pending decision

## 2014-10-17 NOTE — Telephone Encounter (Signed)
Will hold in triage until appeals fax is received.  ATC [employer]Beth at x [phone] -- NA, no voicemail set up. wcb

## 2014-10-18 NOTE — Telephone Encounter (Signed)
ATC Beth back from [facility].   Line busy x 2 .  WCB  ( There is a form in triage PA box from [employer] but I dont think is the correct form)

## 2014-10-18 NOTE — Telephone Encounter (Signed)
Beth called back [phone] ext [phone].

## 2014-10-18 NOTE — Telephone Encounter (Signed)
Spoke with Eustaquio Maize at [facility] and answered more questions regarding appeal for [facility].  She stated that this will be turned over to pharmacy for approval and will contact our office within 5 days with decision.  Will forward to [place] to f/u on

## 2014-10-21 ENCOUNTER — Other Ambulatory Visit: Payer: Self-pay | Admitting: *Deleted

## 2014-10-24 NOTE — Telephone Encounter (Signed)
Needing more information/diocumentation faxed to them as to why the adviar symbicort and breo won't work for pt and the Ruthe Mannan is the only thing that  Will work for this pt there ref# i [id number] the direct # to the appeals dept is [id number] if you have question as to what they need call [phone].Hillery Hunter

## 2014-10-24 NOTE — Telephone Encounter (Signed)
Spoke with pt- states that he spoke with [facility] this AM around 04/1029 and they are faxing a form over to the main fax [phone]) for Dr [name] to review and sign.  Will send to [place] to keep an eye for this form to come across her desk today - should state URGENT on it as they are wanting a response within 24 hours.

## 2014-10-24 NOTE — Telephone Encounter (Signed)
[phone] Pt. Calling back You will be receiving a fax from [employer] or [employer]

## 2014-10-25 NOTE — Telephone Encounter (Signed)
Received denial on pt dulera. Reports it is not approved by the FA pr by one of the medicare guidelines. Please advise thanks

## 2014-10-25 NOTE — Telephone Encounter (Signed)
We have done all we can do, including an appeal letter.  Will have to go with symbicort, advair or breo.  Or pay out of pocket for dulera.

## 2014-10-25 NOTE — Telephone Encounter (Signed)
I called made pt aware. He still has some dulera left and wants to think about this. He will call back once he decides.

## 2014-10-27 ENCOUNTER — Telehealth: Payer: Self-pay | Admitting: Pulmonary Disease

## 2014-10-27 NOTE — Telephone Encounter (Signed)
Called [employer], given information needed to make their decision regarding Dulera.  Awaiting response for [facility].  Nothing further needed.

## 2014-11-18 ENCOUNTER — Encounter: Payer: Self-pay | Admitting: *Deleted

## 2014-11-18 ENCOUNTER — Telehealth: Payer: Self-pay | Admitting: Pulmonary Disease

## 2014-11-18 MED ORDER — BUDESONIDE-FORMOTEROL FUMARATE 160-4.5 MCG/ACT IN AERO: 2.0000 | INHALATION_SPRAY | Freq: Two times a day (BID) | RESPIRATORY_TRACT | Status: DC

## 2014-11-18 NOTE — Telephone Encounter (Signed)
ATC, NA and VM box not set up  [facility]

## 2014-11-18 NOTE — Telephone Encounter (Signed)
I would get him set up with Dr. Lamonte Sakai.  If his upcoming is close to the date when I leave, we can see if it wants to come in early and see me and we can discuss further.

## 2014-11-18 NOTE — Telephone Encounter (Signed)
Rx has been sent in. Pt is aware. Has upcoming appointment with [facility] after he leaves. Wants to know who [place] recommends he sees.  [place]- please advise. Thanks.

## 2014-11-21 NOTE — Telephone Encounter (Signed)
Attempted to call pt. No answer, voicemail has not been set up. Will try back later.

## 2014-11-22 NOTE — Telephone Encounter (Signed)
Made appt with Meyer before he leaves. Nothing further needed.

## 2014-11-25 ENCOUNTER — Telehealth: Payer: Self-pay | Admitting: Pulmonary Disease

## 2014-11-25 NOTE — Telephone Encounter (Signed)
lmtcb X1 for Ron

## 2014-11-28 NOTE — Telephone Encounter (Signed)
lmtcb for Ron.

## 2014-11-29 NOTE — Telephone Encounter (Signed)
LMTC x 1  

## 2014-11-30 NOTE — Telephone Encounter (Signed)
LMTCB and will close per triage protocol 

## 2014-12-13 ENCOUNTER — Ambulatory Visit (INDEPENDENT_AMBULATORY_CARE_PROVIDER_SITE_OTHER): Payer: Medicare Other | Admitting: Pulmonary Disease

## 2014-12-13 ENCOUNTER — Encounter: Payer: Self-pay | Admitting: Pulmonary Disease

## 2014-12-13 VITALS — BP 116/64 | HR 67 | Temp 97.6°F | Ht 72.0 in | Wt 144.6 lb

## 2014-12-13 DIAGNOSIS — J438 Other emphysema: Secondary | ICD-10-CM | POA: Diagnosis not present

## 2014-12-13 NOTE — Patient Instructions (Signed)
No change in breathing medications, except try spiriva respimat in the place of handihaler.  Take 2 inhalations each am, and decide which format works better for you.   We can send in prescription if you wish to change. Work on conditioning. followup with Dr. Lamonte Sakai in 43mo, and as needed.

## 2014-12-13 NOTE — Progress Notes (Signed)
   Subjective:    Patient ID: Francisco [name], male    DOB: 1945-12-17, 69 y.o.   MRN: [id number]  HPI The patient comes in today for follow-up of his severe COPD. He is on a very aggressive medication regimen, and although he does not have frequent exacerbations currently, he has severe dyspnea on exertion with activities. He was having frequent exacerbations before starting azithromycin 3 days a week and chronic prednisone. He denies any significant chest congestion, or purulence.   Review of Systems  Constitutional: Negative for fever and unexpected weight change.  HENT: Negative for congestion, dental problem, ear pain, nosebleeds, postnasal drip, rhinorrhea, sinus pressure, sneezing, sore throat and trouble swallowing.   Eyes: Negative for redness and itching.  Respiratory: Positive for cough, shortness of breath and wheezing. Negative for chest tightness.   Cardiovascular: Negative for palpitations and leg swelling.  Gastrointestinal: Negative for nausea and vomiting.  Genitourinary: Negative for dysuria.  Musculoskeletal: Negative for joint swelling.  Skin: Negative for rash.  Neurological: Negative for headaches.  Hematological: Does not bruise/bleed easily.  Psychiatric/Behavioral: Negative for dysphoric mood. The patient is not nervous/anxious.        Objective:   Physical Exam Thin male in no acute distress Nose without purulence or discharge noted Neck without lymphadenopathy or thyromegaly Chest with very diminished breath sounds throughout, no true wheezing, very prominent upper airway pseudo wheezing secondary to auto PEEP Cardiac exam with regular rate and rhythm Lower extremities with mild ankle edema, no cyanosis Alert and oriented, moves all 4 extremities.       Assessment & Plan:

## 2014-12-13 NOTE — Assessment & Plan Note (Signed)
The patient has severe COPD, and ongoing dyspnea on exertion despite being on an aggressive regimen. He is on a LABA/ICS, anticholinergic, chronic prednisone at 10 mg a day, and also azithromycin 3 days a week. He is not having acute exacerbations, but he has seen no improvement in his dyspnea with daily activities. I think we are doing everything we can from a pulmonary standpoint, and asked him to try and work hard on conditioning. I've also asked him to work on total discontinuation of his prior smoking.

## 2014-12-21 ENCOUNTER — Ambulatory Visit: Payer: Medicare Other | Admitting: Podiatry

## 2014-12-28 ENCOUNTER — Ambulatory Visit: Payer: Medicare Other | Admitting: Pulmonary Disease

## 2015-02-13 ENCOUNTER — Telehealth: Payer: Self-pay | Admitting: Emergency Medicine

## 2015-02-13 MED ORDER — TIOTROPIUM BROMIDE MONOHYDRATE 2.5 MCG/ACT IN AERS: 2.0000 | INHALATION_SPRAY | Freq: Every day | RESPIRATORY_TRACT | Status: DC

## 2015-02-13 MED ORDER — ALBUTEROL SULFATE HFA 108 (90 BASE) MCG/ACT IN AERS: INHALATION_SPRAY | RESPIRATORY_TRACT | Status: DC

## 2015-02-13 NOTE — Telephone Encounter (Signed)
Rx has been sent in. Pt is aware. While on the phone, he requests a refill on Ventolin as well. Nothing further was needed.

## 2015-04-24 ENCOUNTER — Encounter (INDEPENDENT_AMBULATORY_CARE_PROVIDER_SITE_OTHER): Payer: Medicare Other | Admitting: Family Medicine

## 2015-04-24 ENCOUNTER — Encounter: Payer: Self-pay | Admitting: Family Medicine

## 2015-04-24 VITALS — BP 140/80 | HR 100 | Temp 98.0°F | Wt 153.0 lb

## 2015-04-24 DIAGNOSIS — Z23 Encounter for immunization: Secondary | ICD-10-CM

## 2015-04-24 NOTE — Patient Instructions (Signed)
Flu shot received today.

## 2015-04-25 NOTE — Progress Notes (Signed)
Patient had arrived for visit. I unfortunately had to go home sick and was unable to see patient.   This encounter was created in error - please disregard.

## 2015-04-28 ENCOUNTER — Other Ambulatory Visit: Payer: Self-pay | Admitting: Emergency Medicine

## 2015-05-17 ENCOUNTER — Encounter: Payer: Self-pay | Admitting: Family Medicine

## 2015-05-17 ENCOUNTER — Ambulatory Visit (INDEPENDENT_AMBULATORY_CARE_PROVIDER_SITE_OTHER): Payer: Medicare Other | Admitting: Family Medicine

## 2015-05-17 VITALS — BP 142/82 | HR 110 | Temp 97.7°F | Wt 154.0 lb

## 2015-05-17 DIAGNOSIS — R319 Hematuria, unspecified: Secondary | ICD-10-CM

## 2015-05-17 DIAGNOSIS — J019 Acute sinusitis, unspecified: Secondary | ICD-10-CM | POA: Diagnosis not present

## 2015-05-17 DIAGNOSIS — F17200 Nicotine dependence, unspecified, uncomplicated: Secondary | ICD-10-CM

## 2015-05-17 DIAGNOSIS — E785 Hyperlipidemia, unspecified: Secondary | ICD-10-CM

## 2015-05-17 DIAGNOSIS — Z0000 Encounter for general adult medical examination without abnormal findings: Secondary | ICD-10-CM | POA: Diagnosis not present

## 2015-05-17 LAB — POCT URINALYSIS DIPSTICK
BILIRUBIN UA: NEGATIVE
GLUCOSE UA: NEGATIVE
Ketones, UA: NEGATIVE
LEUKOCYTES UA: NEGATIVE
NITRITE UA: NEGATIVE
PH UA: 5.5
Protein, UA: NEGATIVE
Spec Grav, UA: <=1.005
Urobilinogen, UA: 0.2

## 2015-05-17 LAB — COMPREHENSIVE METABOLIC PANEL
ALK PHOS: 63 U/L (ref 39–117)
ALT: 36 U/L (ref 0–53)
AST: 32 U/L (ref 0–37)
Albumin: 4.5 g/dL (ref 3.5–5.2)
BUN: 11 mg/dL (ref 6–23)
CALCIUM: 10 mg/dL (ref 8.4–10.5)
CO2: 31 mEq/L (ref 19–32)
Chloride: 96 mEq/L (ref 96–112)
Creatinine, Ser: 0.81 mg/dL (ref 0.40–1.50)
GFR: 100.82 mL/min (ref >60.00)
Glucose, Bld: 97 mg/dL (ref 70–99)
POTASSIUM: 5 meq/L (ref 3.5–5.1)
SODIUM: 136 meq/L (ref 135–145)
Total Bilirubin: 0.6 mg/dL (ref 0.2–1.2)
Total Protein: 7.4 g/dL (ref 6.0–8.3)

## 2015-05-17 LAB — CBC
HEMATOCRIT: 45.8 % (ref 39.0–52.0)
Hemoglobin: 15.5 g/dL (ref 13.0–17.0)
MCHC: 33.8 g/dL (ref 30.0–36.0)
MCV: 97.7 fl (ref 78.0–100.0)
Platelets: 304 10*3/uL (ref 150.0–400.0)
RBC: 4.69 Mil/uL (ref 4.22–5.81)
RDW: 12.7 % (ref 11.5–15.5)
WBC: 10.9 10*3/uL — ABNORMAL HIGH (ref 4.0–10.5)

## 2015-05-17 LAB — LIPID PANEL
CHOLESTEROL: 238 mg/dL — AB (ref 0–200)
HDL: 120.5 mg/dL (ref >39.00)
LDL Cholesterol: 108 mg/dL — ABNORMAL HIGH (ref 0–99)
NonHDL: 117.3
Total CHOL/HDL Ratio: 2
Triglycerides: 48 mg/dL (ref 0.0–149.0)
VLDL: 9.6 mg/dL (ref 0.0–40.0)

## 2015-05-17 LAB — URINALYSIS, MICROSCOPIC ONLY: RBC / HPF: NONE SEEN (ref 0–?)

## 2015-05-17 MED ORDER — AMOXICILLIN-POT CLAVULANATE 875-125 MG PO TABS: 1.0000 | ORAL_TABLET | Freq: Two times a day (BID) | ORAL | Status: DC

## 2015-05-17 NOTE — Patient Instructions (Addendum)
Francisco [name] will wash out ears. If we cannot get it all- she can refer you to ENT but sounds like you have done well with wash out before  I am likely to advise statin- we will await results  Glad you are plugged in with Dr. Lamonte Sakai- he is a great physician  Blood pressure up but having trouble with breathing over last week and today- could be reason. Check at home as hopefully you get to feeling better  Sounds like sinusitis but I am concerned your breathing is worse- lets treat with augmentin to be aggressive. Let pulmonology know if lung issues dont improve- may need short boost in prednisone (I can also call this in if you need)  Fasting labs before you go

## 2015-05-17 NOTE — Addendum Note (Signed)
Addended by: Clyde Lundborg A on: 05/17/2015 10:43 AM   Modules accepted: Orders

## 2015-05-17 NOTE — Progress Notes (Signed)
Francisco Reddish, MD Phone: [phone]  Subjective:  Patient presents today for their annual physical. Chief complaint-noted.   -See problem oriented charting  ROS- full  review of systems was completed and negative except for: sinus and breathing issues as noted below. Also some swelling in arms and legs- on chronic prednisone  The following were reviewed and entered/updated in epic: Past Medical History  Diagnosis Date  . Onychomycosis     Dr. Judi Cong  . Chest x-ray abnormality   . COPD (chronic obstructive pulmonary disease) [facility])     PFT's rec Jul 17, 2009  . Chronic rhinitis     -Sinus Ct 08/01/2009 >> Bilateral maxillary sinusitis with some mucosal thickeningin the sphenoid and frontal sinuses as well with air fluid levels present -chronic rhinitis flyer Aug 04, 2009   Patient Active Problem List   Diagnosis Date Noted  . TOBACCO USE 08/23/2008    Priority: High  . COPD (chronic obstructive pulmonary disease) with emphysema ([place]) 08/23/2008    Priority: High  . Hyperlipidemia 07/22/2014    Priority: Medium  . Onychomycosis 07/22/2014    Priority: Low  . Left ankle swelling 07/22/2014    Priority: Low  . History of skin cancer 05/17/2014    Priority: Low  . Chronic rhinitis 10/28/2012    Priority: Low  . Pulmonary nodule 09/23/2011    Priority: Low  . History of colonic polyps 08/23/2008    Priority: Low  . COPD exacerbation ([place]) 09/08/2013   Past Surgical History  Procedure Laterality Date  . Appendectomy    . Tonsillectomy      Family History  Problem Relation Age of Onset  . Heart disease Mother     CABG in her 72s, nonsmoker  . Cancer Mother   . Stroke Father   . Heart disease Father     Died of MI at age 10, smoker  . Hepatitis Sister   . Coronary artery disease Other     male 1st degree relative <60    Medications- reviewed and updated Current Outpatient Prescriptions  Medication Sig Dispense Refill  . budesonide-formoterol (SYMBICORT)  160-4.5 MCG/ACT inhaler Inhale 2 puffs into the lungs 2 (two) times daily. 3 Inhaler 3  . fluticasone (FLONASE) 50 MCG/ACT nasal spray Place 2 sprays into both nostrils daily. 48 g 3  . montelukast (SINGULAIR) 10 MG tablet Take 1 tablet (10 mg total) by mouth at bedtime. 90 tablet 3  . SPIRIVA HANDIHALER 18 MCG inhalation capsule Inhale the contents of 1  capsule via HandiHaler  daily 90 capsule 3  . SPIRIVA RESPIMAT 2.5 MCG/ACT AERS Use 2 inhalations into the  lungs daily 12 g 0  . terbinafine (LAMISIL) 250 MG tablet Take 250 mg by mouth See admin instructions. Take 1 tablet daily for 7 days every month    . triamterene-hydrochlorothiazide (MAXZIDE-25) 37.5-25 MG per tablet Take 1 tablet by mouth daily as needed. Use sparingly 30 tablet 0  . VENTOLIN HFA 108 (90 BASE) MCG/ACT inhaler Use 2 puffs every 6 hours  as needed for wheezing 3 Inhaler 0  . amoxicillin-clavulanate (AUGMENTIN) 875-125 MG tablet Take 1 tablet by mouth 2 (two) times daily. 16 tablet 0   No current facility-administered medications for this visit.    Allergies-reviewed and updated Allergies  Allergen Reactions  . Symbicort [Budesonide-Formoterol Fumarate]     Ineffective per pt.     Social History   Social History  . Marital Status: Married    Spouse Name: N/A  .  Number of Children: 2  . Years of Education: N/A   Occupational History  .     Social History Main Topics  . Smoking status: Current Every Day Smoker -- 2.00 packs/day for 10 years    Types: Pipe, Cigarettes  . Smokeless tobacco: Not on file     Comment: Smokes pipe up to 2-3 times a day  . Alcohol Use: 10.8 oz/week    18 Cans of beer per week     Comment: daily  . Drug Use: No  . Sexual Activity: Not on file   Other Topics Concern  . Not on file   Social History Narrative   Married (wife Katy Apo patient of Dr. Yong Channel) with children (2-son and daughter). 1 grandson.       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       Hobbies: fishing, projects at home    ROS--See HPI   Objective: BP 142/82 mmHg  Pulse 110  Temp(Src) 97.7 F (36.5 C)  Wt 154 lb (69.854 kg)  SpO2 93% Gen: NAD, resting comfortably HEENT: Mucous membranes are moist. Oropharynx normal Neck: no thyromegaly CV: RRR no murmurs rubs or gallops Lungs: prolonged expiratory phase. No crackles or rhonchi but does have diffuse wheeze and had already taken albuterol recenly Abdomen: soft/nontender/nondistended/normal bowel sounds. No rebound or guarding.  Ext: trace edema  R, 1+ left (had venous duplex in march for same issue) Skin: warm, dry Neuro: grossly normal, moves all extremities, PERRLA  Assessment/Plan:  69 y.o. male presenting for annual physical.  Health Maintenance counseling: 1. Anticipatory guidance: Patient counseled regarding regular dental exams, eye exams, wearing seatbelts.  2. Risk factor reduction:  Advised patient of need for regular exercise and diet rich and fruits and vegetables to reduce risk of heart attack and stroke.  3. Immunizations/screenings/ancillary studies- already did flu shot Health Maintenance Due  Topic Date Due  . Hepatitis C Screening - declined 1945/11/25  4. Prostate cancer screening- low yield given COPD 5. Colon cancer screening - 03/2014 with 5 year follow up planned, but has had repeat discussion and may not complete due to COPD 6. Skin cancer screening- [place] [facility] dermatology Dr. Wilhemina Bonito  Tobacco use- advised cessation, declines. Could do lung cancer screening Despite COPD- which is reasonably controlled on symbicort and spiriva with prn albuterol sometimes 8 sprays in 24 hours at others 4-6 puffs a day Sees Dr. Lamonte Sakai in 2 weeks On prednisone everyday- may be cause of swelling in arms and legs. Has had eval of heart in 2013 with echo and stress test largely reassuring- diastolic I  Hyperlipidemia- calculate 10 year risk on lipids but likely to suggest statin  Hypertension- poor  control on triamterne-hctz 37.5-'25mg'$  - just takes as needed BP Readings from Last 3 Encounters:  05/17/15 142/82  04/24/15 140/80  12/13/14 116/64    Sinusitis/ ? Early COPD exacerbation Sinus congestion, occasional chill, sinus pain L maxillary x 7 days. Breathing worse over this week and more green sputum production but may be draining from sinus. Will send in augmentin for sinus infection (technically only day 8, but I am worried about breathing issues/progression)   Plan to forward copy to Dr. Wilhemina Bonito of [facility] dermatology- patient will remind [place] when [facility] calls as well   Return precautions advised.   Orders Placed This Encounter  Procedures  . CBC    Uncertain  . Comprehensive metabolic panel    [place]    Order  Specific Question:  Has the patient fasted?    Answer:  No  . Lipid panel    [place]    Order Specific Question:  Has the patient fasted?    Answer:  No  . POCT urinalysis dipstick    In house    Meds ordered this encounter  . amoxicillin-clavulanate (AUGMENTIN) 875-125 MG tablet    Sig: Take 1 tablet by mouth 2 (two) times daily.    Dispense:  16 tablet    Refill:  0

## 2015-05-18 ENCOUNTER — Telehealth: Payer: Self-pay | Admitting: Family Medicine

## 2015-05-18 MED ORDER — PREDNISONE 20 MG PO TABS: ORAL_TABLET | ORAL | Status: DC

## 2015-05-18 NOTE — Telephone Encounter (Signed)
Pt.notified

## 2015-05-18 NOTE — Telephone Encounter (Signed)
Pt states he was to let dr hunter know if he needs a rx for prednisone. Pt states he DOES.  [facility]

## 2015-05-18 NOTE — Telephone Encounter (Signed)
See below, Did not see in your note as to what strength how many or directions for prednisone.

## 2015-05-18 NOTE — Telephone Encounter (Signed)
Sent in please inform patient

## 2015-06-06 ENCOUNTER — Encounter: Payer: Self-pay | Admitting: Emergency Medicine

## 2015-06-06 ENCOUNTER — Ambulatory Visit (INDEPENDENT_AMBULATORY_CARE_PROVIDER_SITE_OTHER): Payer: Medicare Other | Admitting: Emergency Medicine

## 2015-06-06 VITALS — BP 120/80 | HR 99 | Ht 72.0 in | Wt 152.0 lb

## 2015-06-06 DIAGNOSIS — F17200 Nicotine dependence, unspecified, uncomplicated: Secondary | ICD-10-CM

## 2015-06-06 DIAGNOSIS — J438 Other emphysema: Secondary | ICD-10-CM | POA: Diagnosis not present

## 2015-06-06 DIAGNOSIS — J310 Chronic rhinitis: Secondary | ICD-10-CM | POA: Diagnosis not present

## 2015-06-06 MED ORDER — TRIAMTERENE-HCTZ 37.5-25 MG PO TABS: 1.0000 | ORAL_TABLET | Freq: Every day | ORAL | Status: DC | PRN

## 2015-06-06 NOTE — Assessment & Plan Note (Signed)
Discussed cessation with him in detail today. I've asked him to notify me when he is ready to try and set a stop date

## 2015-06-06 NOTE — Addendum Note (Signed)
Addended by: Desmond Dike C on: 06/06/2015 09:33 AM   Modules accepted: Orders

## 2015-06-06 NOTE — Assessment & Plan Note (Signed)
Walking oximetry today on room air Please continue Spiriva and Symbicort Please continue albuterol 2 puffs as needed for shortness of breath Please continue azithromycin 3 times a week Please continue prednisone 10 mg daily. Keep track of how frequently you need to increase your prednisone so that we can review next time We discussed possibly starting the medication Daliresp at some point in the future; he has tolerated poorly in the past per Dr Janifer Adie notes.  He did work hard on decreasing and then ultimately stopping your smoking Follow with Dr Lamonte Sakai in 3 months or sooner if you have any problems.

## 2015-06-06 NOTE — Assessment & Plan Note (Signed)
Continue Singulair and fluticasone nasal spray

## 2015-06-06 NOTE — Patient Instructions (Addendum)
Walking oximetry today on room air Please continue Spiriva and Symbicort Please continue albuterol 2 puffs as needed for shortness of breath Please continue azithromycin 3 times a week Please continue prednisone 10 mg daily. Keep track of how frequently you need to increase your prednisone so that we can review next time We discussed possibly starting the medication Daliresp at some point in the future He did work hard on decreasing and then ultimately stopping your smoking Follow with Dr Lamonte Sakai in 3 months or sooner if you have any problems.

## 2015-06-06 NOTE — Progress Notes (Signed)
Subjective:    Patient ID: Francisco [name], male    DOB: May 08, 1946, 69 y.o.   MRN: [id number]  HPI 69 year old man, active smoker (a pipe about 2x a day), who has been followed by Dr [name] for severe COPD. Personal and reviewed his prior poor function testing from 10/02/09 that shows severe obstruction with an FEV1 of 1.2 L or 34% predicted and a positive bronchodilator response. He's been managed on maximal bronchodilator therapy, prednisone 10 mg daily chronically, and azithromycin 3 days a week.   He states that he is doing "ok" but he knows that his breathing is slowly worsening, now cannot walk a short distance without dyspnea. He is feeling the need to nap. He had walking oximetry within the last year. He has sinus congestion - is on singulair, flonase. He occasionally uses bursts of his prednisone when sx dictate. He averages flares about once a month. Much less active now, retired in the last year.     CAT Score 10/12/2013 11/25/2012  Total CAT Score 21 21      Review of Systems As per HPI  Past Medical History  Diagnosis Date  . Onychomycosis     Dr. Judi Cong  . Chest x-ray abnormality   . COPD (chronic obstructive pulmonary disease) [facility])     PFT's rec Jul 17, 2009  . Chronic rhinitis     -Sinus Ct 08/01/2009 >> Bilateral maxillary sinusitis with some mucosal thickeningin the sphenoid and frontal sinuses as well with air fluid levels present -chronic rhinitis flyer Aug 04, 2009     Family History  Problem Relation Age of Onset  . Heart disease Mother     CABG in her 9s, nonsmoker  . Cancer Mother   . Stroke Father   . Heart disease Father     Died of MI at age 52, smoker  . Hepatitis Sister   . Coronary artery disease Other     male 1st degree relative <60     Social History   Social History  . Marital Status: Married    Spouse Name: N/A  . Number of Children: 2  . Years of Education: N/A   Occupational History  .     Social History Main Topics  .  Smoking status: Current Every Day Smoker -- 2.00 packs/day for 10 years    Types: Pipe, Cigarettes  . Smokeless tobacco: Not on file     Comment: Smokes pipe up to 2-3 times a day  . Alcohol Use: 10.8 oz/week    18 Cans of beer per week     Comment: daily  . Drug Use: No  . Sexual Activity: Not on file   Other Topics Concern  . Not on file   Social History Narrative   Married (wife Katy Apo patient of Dr. Yong Channel) with children (2-son and daughter). 1 grandson.       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       Hobbies: fishing, projects at home     Allergies  Allergen Reactions  . Symbicort [Budesonide-Formoterol Fumarate]     Ineffective per pt.      Outpatient Prescriptions Prior to Visit  Medication Sig Dispense Refill  . budesonide-formoterol (SYMBICORT) 160-4.5 MCG/ACT inhaler Inhale 2 puffs into the lungs 2 (two) times daily. 3 Inhaler 3  . fluticasone (FLONASE) 50 MCG/ACT nasal spray Place 2 sprays into both nostrils daily. 48 g 3  . montelukast (SINGULAIR) 10 MG  tablet Take 1 tablet (10 mg total) by mouth at bedtime. 90 tablet 3  . SPIRIVA RESPIMAT 2.5 MCG/ACT AERS Use 2 inhalations into the  lungs daily 12 g 0  . terbinafine (LAMISIL) 250 MG tablet Take 250 mg by mouth See admin instructions. Take 1 tablet daily for 7 days every month    . triamterene-hydrochlorothiazide (MAXZIDE-25) 37.5-25 MG per tablet Take 1 tablet by mouth daily as needed. Use sparingly 30 tablet 0  . VENTOLIN HFA 108 (90 BASE) MCG/ACT inhaler Use 2 puffs every 6 hours  as needed for wheezing 3 Inhaler 0  . amoxicillin-clavulanate (AUGMENTIN) 875-125 MG tablet Take 1 tablet by mouth 2 (two) times daily. 16 tablet 0  . predniSONE (DELTASONE) 20 MG tablet Take 2 pills for 5 days, 1 pill for 2 days 12 tablet 0  . SPIRIVA HANDIHALER 18 MCG inhalation capsule Inhale the contents of 1  capsule via HandiHaler  daily 90 capsule 3   No facility-administered medications  prior to visit.         Objective:   Physical Exam Filed Vitals:   06/06/15 0855  BP: 120/80  Pulse: 99  Height: 6' (1.829 m)  Weight: 152 lb (68.947 kg)  SpO2: 94%   Gen: Pleasant, Thin, in no distress,  normal affect  ENT: No lesions,  mouth clear,  oropharynx clear, no postnasal drip  Neck: No JVD, no stridor  Lungs: No use of accessory muscles, bilateral diffuse expiratory wheezes  Cardiovascular: RRR, heart sounds normal, no murmur or gallops, no peripheral edema  Musculoskeletal: No deformities, no cyanosis or clubbing  Neuro: alert, non focal  Skin: Warm, no lesions or rashes     Assessment & Plan:  Chronic rhinitis Continue Singulair and fluticasone nasal spray  TOBACCO USE Discussed cessation with him in detail today. I've asked him to notify me when he is ready to try and set a stop date  COPD (chronic obstructive pulmonary disease) with emphysema Walking oximetry today on room air Please continue Spiriva and Symbicort Please continue albuterol 2 puffs as needed for shortness of breath Please continue azithromycin 3 times a week Please continue prednisone 10 mg daily. Keep track of how frequently you need to increase your prednisone so that we can review next time We discussed possibly starting the medication Daliresp at some point in the future; he has tolerated poorly in the past per Dr Janifer Adie notes.  He did work hard on decreasing and then ultimately stopping your smoking Follow with Dr Lamonte Sakai in 3 months or sooner if you have any problems.

## 2015-06-13 ENCOUNTER — Other Ambulatory Visit: Payer: Self-pay | Admitting: *Deleted

## 2015-06-13 MED ORDER — PREDNISONE 10 MG PO TABS: 10.0000 mg | ORAL_TABLET | Freq: Every day | ORAL | Status: DC

## 2015-06-16 ENCOUNTER — Other Ambulatory Visit: Payer: Self-pay | Admitting: Emergency Medicine

## 2015-07-04 ENCOUNTER — Other Ambulatory Visit: Payer: Self-pay | Admitting: *Deleted

## 2015-07-04 MED ORDER — MONTELUKAST SODIUM 10 MG PO TABS: 10.0000 mg | ORAL_TABLET | Freq: Every day | ORAL | Status: DC

## 2015-07-04 NOTE — Telephone Encounter (Signed)
Received refill request from [facility] for 90 day supply of Singulair Rx sent to pharmacy. Nothing further needed.

## 2015-07-26 ENCOUNTER — Other Ambulatory Visit: Payer: Self-pay | Admitting: Emergency Medicine

## 2015-07-31 ENCOUNTER — Telehealth: Payer: Self-pay | Admitting: Emergency Medicine

## 2015-07-31 MED ORDER — AZITHROMYCIN 250 MG PO TABS
ORAL_TABLET | ORAL | Status: DC
Start: 2015-07-31 — End: 2015-10-17

## 2015-07-31 NOTE — Telephone Encounter (Signed)
Spoke with pt's wife. Pt needs a refill on Azithromycin. He takes this 3 times a week. Rx has been sent in. Nothing further was needed.

## 2015-08-01 ENCOUNTER — Other Ambulatory Visit: Payer: Self-pay | Admitting: Emergency Medicine

## 2015-08-01 ENCOUNTER — Telehealth: Payer: Self-pay | Admitting: Emergency Medicine

## 2015-08-01 MED ORDER — BUDESONIDE-FORMOTEROL FUMARATE 160-4.5 MCG/ACT IN AERO: 2.0000 | INHALATION_SPRAY | Freq: Two times a day (BID) | RESPIRATORY_TRACT | Status: DC

## 2015-08-01 NOTE — Telephone Encounter (Signed)
Rx has been sent to [facility]. Nothing further was needed.

## 2015-08-22 ENCOUNTER — Telehealth: Payer: Self-pay | Admitting: Family Medicine

## 2015-08-22 NOTE — Telephone Encounter (Signed)
Returned pt call and he was sent to scheduling to make an appt with Dr. Yong Channel since he has seen [facility] before for his swelling.

## 2015-08-22 NOTE — Telephone Encounter (Signed)
Patient has swelling in his leg and wants to talk to the doctor to see if he needs to comes back to see his PCP or go to another doctor.

## 2015-08-23 ENCOUNTER — Ambulatory Visit: Payer: Medicare Other | Admitting: Family Medicine

## 2015-09-11 ENCOUNTER — Ambulatory Visit (INDEPENDENT_AMBULATORY_CARE_PROVIDER_SITE_OTHER): Payer: Medicare Other | Admitting: Emergency Medicine

## 2015-09-11 ENCOUNTER — Encounter: Payer: Self-pay | Admitting: Emergency Medicine

## 2015-09-11 VITALS — BP 112/78 | HR 97 | Ht 72.0 in | Wt 157.0 lb

## 2015-09-11 DIAGNOSIS — M25472 Effusion, left ankle: Secondary | ICD-10-CM

## 2015-09-11 DIAGNOSIS — J438 Other emphysema: Secondary | ICD-10-CM

## 2015-09-11 DIAGNOSIS — F17200 Nicotine dependence, unspecified, uncomplicated: Secondary | ICD-10-CM

## 2015-09-11 NOTE — Progress Notes (Signed)
Subjective:    Patient ID: Francisco [name], male    DOB: 1946-03-13, 70 y.o.   MRN: [id number]  HPI 70 year old man, active smoker (a pipe about 2x a day), who has been followed by Dr [name] for severe COPD. Personal and reviewed his prior poor function testing from 10/02/09 that shows severe obstruction with an FEV1 of 1.2 L or 34% predicted and a positive bronchodilator response. He's been managed on maximal bronchodilator therapy, prednisone 10 mg daily chronically, and azithromycin 3 days a week.   He states that he is doing "ok" but he knows that his breathing is slowly worsening, now cannot walk a short distance without dyspnea. He is feeling the need to nap. He had walking oximetry within the last year. He has sinus congestion - is on singulair, flonase. He occasionally uses bursts of his prednisone when sx dictate. He averages flares about once a month. Much less active now, retired in the last year.   ROV 09/11/15 -- follow-up visit for COPD. He's been managed on chronic prednisone 10 mg daily as well as Spiriva and Symbicort. At her last visit we continue these medications and we discussed smoking cessation. He has LLE edema (doppler negative in the past). He is on Maxide, presrcibed by Dr [name].  He remains on Maxide. He is having progressive worsening in exertional SOB, happens when he walks, takes a shower. He doesn't exercise much. He increases his pred as needed, has done some short tapers about every month. Smoking about a bowl full a day.     CAT Score 10/12/2013 11/25/2012  Total CAT Score 21 21      Review of Systems As per HPI  Past Medical History  Diagnosis Date  . Onychomycosis     Dr. Judi Cong  . Chest x-ray abnormality   . COPD (chronic obstructive pulmonary disease) [facility])     PFT's rec Jul 17, 2009  . Chronic rhinitis     -Sinus Ct 08/01/2009 >> Bilateral maxillary sinusitis with some mucosal thickeningin the sphenoid and frontal sinuses as well with air fluid levels  present -chronic rhinitis flyer Aug 04, 2009     Family History  Problem Relation Age of Onset  . Heart disease Mother     CABG in her 29s, nonsmoker  . Cancer Mother   . Stroke Father   . Heart disease Father     Died of MI at age 19, smoker  . Hepatitis Sister   . Coronary artery disease Other     male 1st degree relative <60     Social History   Social History  . Marital Status: Married    Spouse Name: N/A  . Number of Children: 2  . Years of Education: N/A   Occupational History  .     Social History Main Topics  . Smoking status: Current Every Day Smoker -- 2.00 packs/day for 10 years    Types: Pipe, Cigarettes  . Smokeless tobacco: Not on file     Comment: Smokes pipe up to 2-3 times a day  . Alcohol Use: 10.8 oz/week    18 Cans of beer per week     Comment: daily  . Drug Use: No  . Sexual Activity: Not on file   Other Topics Concern  . Not on file   Social History Narrative   Married (wife Katy Apo patient of Dr. Yong Channel) with children (2-son and daughter). 1 grandson.       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       Hobbies: fishing, projects at home     Allergies  Allergen Reactions  . Symbicort [Budesonide-Formoterol Fumarate]     Ineffective per pt.      Outpatient Prescriptions Prior to Visit  Medication Sig Dispense Refill  . azithromycin (ZITHROMAX) 250 MG tablet Take 1 tablet on Monday, Wednesday, Friday 36 tablet 1  . budesonide-formoterol (SYMBICORT) 160-4.5 MCG/ACT inhaler Inhale 2 puffs into the lungs 2 (two) times daily. 3 Inhaler 3  . fluticasone (FLONASE) 50 MCG/ACT nasal spray Place 2 sprays into both nostrils daily. 48 g 3  . montelukast (SINGULAIR) 10 MG tablet Take 1 tablet (10 mg total) by mouth at bedtime. 90 tablet 1  . predniSONE (DELTASONE) 10 MG tablet Take 1 tablet (10 mg total) by mouth daily with breakfast. 90 tablet 3  . SPIRIVA RESPIMAT 2.5 MCG/ACT AERS Use 2 inhalations into the   lungs daily 12 g 1  . terbinafine (LAMISIL) 250 MG tablet Take 250 mg by mouth See admin instructions. Take 1 tablet daily for 7 days every month    . triamterene-hydrochlorothiazide (MAXZIDE-25) 37.5-25 MG tablet TAKE 1 TABLET BY MOUTH DAILY AS NEEDED. USE SPARINGLY 30 tablet 0  . VENTOLIN HFA 108 (90 Base) MCG/ACT inhaler Use 2 puffs every 6 hours  as needed for wheezing 54 g 1   No facility-administered medications prior to visit.         Objective:   Physical Exam Filed Vitals:   09/11/15 0934  BP: 112/78  Pulse: 97  Height: 6' (1.829 m)  Weight: 157 lb (71.215 kg)  SpO2: 93%   Gen: Pleasant, Thin, in no distress,  normal affect  ENT: No lesions,  mouth clear,  oropharynx clear, no postnasal drip  Neck: No JVD, no stridor  Lungs: No use of accessory muscles, bilateral diffuse expiratory wheezes  Cardiovascular: RRR, heart sounds normal, no murmur or gallops, no peripheral edema  Musculoskeletal: No deformities, no cyanosis or clubbing  Neuro: alert, non focal  Skin: Warm, no lesions or rashes     Assessment & Plan:  Left ankle swelling Continue maxide, change to scheduled.   TOBACCO USE Discussed cessation today.   COPD (chronic obstructive pulmonary disease) with emphysema Slowly progressively worse. We discussed the role of smoking and his progressive disease. He will continue Spiriva, Symbicort, SABA when necessary. He uses prednisone 10 mg daily and then increase his typically once a month with a small short taper. His wheezing is significant and I don't have a better alternative at this time so we will continue this plan.

## 2015-09-11 NOTE — Assessment & Plan Note (Signed)
Discussed cessation today. 

## 2015-09-11 NOTE — Assessment & Plan Note (Signed)
Slowly progressively worse. We discussed the role of smoking and his progressive disease. He will continue Spiriva, Symbicort, SABA when necessary. He uses prednisone 10 mg daily and then increase his typically once a month with a small short taper. His wheezing is significant and I don't have a better alternative at this time so we will continue this plan.

## 2015-09-11 NOTE — Assessment & Plan Note (Signed)
Continue maxide, change to scheduled.

## 2015-09-11 NOTE — Patient Instructions (Addendum)
Please continue your Spiriva and Symbicort Continue prednisone '10mg'$  daily and increase as needed. Keep track of your usage Take albuterol 2 puffs up to every 4 hours if needed for shortness of breath.  Work hard on stopping smoking  Continue maxide daily Follow with Dr Lamonte Sakai in 4 months or sooner if you have any problems.

## 2015-09-12 ENCOUNTER — Telehealth: Payer: Self-pay | Admitting: Emergency Medicine

## 2015-09-12 ENCOUNTER — Other Ambulatory Visit: Payer: Self-pay | Admitting: Emergency Medicine

## 2015-09-12 NOTE — Telephone Encounter (Signed)
Called and spoke pt. Pt states that the Maxzide was not sent to the pharmacy tomorrow. Pt states he went to [facility] on [address] this morning and they informed him that the med was not received. I explained to him that I would contact the pharmacy and return his call. He voiced understanding and had no further questions  Called spoke with the [place] with [facility]. [facility]   Called and spoke with the pt. Informed him that the med should be ready for pick up in the next hour. He voiced understanding and had no further questions. Nothing further needed.

## 2015-10-16 ENCOUNTER — Other Ambulatory Visit: Payer: Self-pay | Admitting: Emergency Medicine

## 2015-10-16 ENCOUNTER — Telehealth: Payer: Self-pay | Admitting: Emergency Medicine

## 2015-10-16 MED ORDER — ALBUTEROL SULFATE HFA 108 (90 BASE) MCG/ACT IN AERS: INHALATION_SPRAY | RESPIRATORY_TRACT | Status: DC

## 2015-10-16 NOTE — Telephone Encounter (Signed)
Spoke with pt's wife.  Requesting ventolin hfa to be sent [facility] for 3 month supply.  Advised rx was sent in Jan for a 6 month supply.  Per Wife, they were advised by [facility], rx was only for 2 inhalers.  3 month rx sent as requested.  Wife aware and voiced no further questions or concerns at this time.

## 2015-10-17 ENCOUNTER — Other Ambulatory Visit: Payer: Self-pay | Admitting: Emergency Medicine

## 2015-11-15 ENCOUNTER — Other Ambulatory Visit: Payer: Self-pay | Admitting: *Deleted

## 2015-11-15 MED ORDER — TRIAMTERENE-HCTZ 37.5-25 MG PO TABS: ORAL_TABLET | ORAL | Status: DC

## 2015-11-30 ENCOUNTER — Other Ambulatory Visit: Payer: Self-pay | Admitting: Emergency Medicine

## 2015-12-05 ENCOUNTER — Telehealth: Payer: Self-pay

## 2015-12-05 NOTE — Telephone Encounter (Signed)
Patient is on the list for [facility] 2017 and may be a good candidate for an AWV in 2017.  

## 2015-12-14 NOTE — Telephone Encounter (Signed)
Call to Mr. Macchia to schedule For AWV; Elected to see Dr. Yong Channel as scheduled in Nov and then will schedule AWV. No preventive screens currently due.

## 2015-12-19 ENCOUNTER — Other Ambulatory Visit: Payer: Self-pay | Admitting: *Deleted

## 2015-12-19 MED ORDER — FLUTICASONE PROPIONATE 50 MCG/ACT NA SUSP: 2.0000 | Freq: Every day | NASAL | Status: DC

## 2016-01-04 ENCOUNTER — Other Ambulatory Visit: Payer: Self-pay | Admitting: Emergency Medicine

## 2016-01-30 ENCOUNTER — Ambulatory Visit (INDEPENDENT_AMBULATORY_CARE_PROVIDER_SITE_OTHER): Payer: Medicare Other | Admitting: Emergency Medicine

## 2016-01-30 ENCOUNTER — Encounter: Payer: Self-pay | Admitting: Emergency Medicine

## 2016-01-30 DIAGNOSIS — F17200 Nicotine dependence, unspecified, uncomplicated: Secondary | ICD-10-CM | POA: Diagnosis not present

## 2016-01-30 MED ORDER — ALBUTEROL SULFATE HFA 108 (90 BASE) MCG/ACT IN AERS: INHALATION_SPRAY | RESPIRATORY_TRACT | 3 refills | Status: DC

## 2016-01-30 NOTE — Assessment & Plan Note (Signed)
Discussed cessation today. 

## 2016-01-30 NOTE — Patient Instructions (Addendum)
Please continue your Spiriva , symbicort, singulair and prednisone as you have been taking them Continue to work on decreasing your smoking.  Take albuterol 2 puffs up to every 4 hours if needed for shortness of breath.  Follow with Dr Lamonte Sakai in 6 months or sooner if you have any problems

## 2016-01-30 NOTE — Progress Notes (Signed)
Subjective:    Patient ID: Francisco [name], male    DOB: [birth year], 70 y.o.   MRN: [id number]  HPI 70 year old man, active smoker (a pipe about 2x a day), who has been followed by Dr [name] for severe COPD. Personal and reviewed his prior poor function testing from 10/02/09 that shows severe obstruction with an FEV1 of 1.2 L or 34% predicted and a positive bronchodilator response. He's been managed on maximal bronchodilator therapy, prednisone 10 mg daily chronically, and azithromycin 3 days a week.   He states that he is doing "ok" but he knows that his breathing is slowly worsening, now cannot walk a short distance without dyspnea. He is feeling the need to nap. He had walking oximetry within the last year. He has sinus congestion - is on singulair, flonase. He occasionally uses bursts of his prednisone when sx dictate. He averages flares about once a month. Much less active now, retired in the last year.   ROV 09/11/15 -- follow-up visit for COPD. He's been managed on chronic prednisone 10 mg daily as well as Spiriva and Symbicort. At her last visit we continue these medications and we discussed smoking cessation. He has LLE edema (doppler negative in the past). He is on Maxide, presrcibed by Dr [name].  He remains on Maxide. He is having progressive worsening in exertional SOB, happens when he walks, takes a shower. He doesn't exercise much. He increases his pred as needed, has done some short tapers about every month. Smoking about a bowl full a day.   ROV 01/30/16 -- patient with a history of tobacco use, COPD. He's been on chronic prednisone, Symbicort, Spiriva. He feels that he is a bit more limited over the last several months. Especially the last week when it has been hot and humid. He increases his prednisone based on how he is feeling - averages a short taper about once a month. Helps w his exertional SOB. He has some cough daily that improves also with pred. He has intermittent wheeze. No abx  since last time. His sinus drainage is under good control. He is no longer taking the maxide every day, has changed to prn based on his leg swelling.     CAT Score 10/12/2013 11/25/2012  Total CAT Score 21 21      Review of Systems As per HPI  Past Medical History:  Diagnosis Date  . Chest x-ray abnormality   . Chronic rhinitis    -Sinus Ct 08/01/2009 >> Bilateral maxillary sinusitis with some mucosal thickeningin the sphenoid and frontal sinuses as well with air fluid levels present -chronic rhinitis flyer Aug 04, 2009  . COPD (chronic obstructive pulmonary disease) [facility])    PFT's rec Jul 17, 2009  . Onychomycosis    Dr. Judi Cong     Family History  Problem Relation Age of Onset  . Heart disease Mother     CABG in her 89s, nonsmoker  . Cancer Mother   . Stroke Father   . Heart disease Father     Died of MI at age 88, smoker  . Hepatitis Sister   . Coronary artery disease Other     male 1st degree relative <60     Social History   Social History  . Marital status: Married    Spouse name: N/A  . Number of children: 2  . Years of education: N/A   Occupational History  .  [employer]   Social History Main Topics  .  Smoking status: Current Every Day Smoker    Packs/day: 2.00    Years: 10.00    Types: Pipe, Cigarettes  . Smokeless tobacco: Not on file     Comment: Smokes pipe up to 2-3 times a day  . Alcohol use 10.8 oz/week    18 Cans of beer per week     Comment: daily  . Drug use: No  . Sexual activity: Not on file   Other Topics Concern  . Not on file   Social History Narrative   Married (wife Katy [name] patient of Dr. Yong Channel) with children (2-son and daughter). 1 grandson.       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       Hobbies: fishing, projects at home     Allergies  Allergen Reactions  . Symbicort [Budesonide-Formoterol Fumarate]     Ineffective per pt.      Outpatient Medications Prior to Visit  Medication Sig  Dispense Refill  . azithromycin (ZITHROMAX) 250 MG tablet TAKE 1 TABLET ON MONDAY,  WEDNESDAY, AND FRIDAY 33 tablet 3  . budesonide-formoterol (SYMBICORT) 160-4.5 MCG/ACT inhaler Inhale 2 puffs into the lungs 2 (two) times daily. 3 Inhaler 3  . fluticasone (FLONASE) 50 MCG/ACT nasal spray Place 2 sprays into both nostrils daily. 48 g 3  . montelukast (SINGULAIR) 10 MG tablet Take 1 tablet (10 mg total) by mouth at bedtime. 90 tablet 1  . predniSONE (DELTASONE) 10 MG tablet Take 1 tablet by mouth  daily with breakfast 90 tablet 1  . SPIRIVA RESPIMAT 2.5 MCG/ACT AERS Use 2 inhalations into the  lungs daily 12 g 1  . terbinafine (LAMISIL) 250 MG tablet Take 250 mg by mouth See admin instructions. Take 1 tablet daily for 7 days every month    . triamterene-hydrochlorothiazide (MAXZIDE-25) 37.5-25 MG tablet TAKE 1 TABLET BY MOUTH  DAILY AS NEEDED. USE  SPARINGLY 90 tablet 1  . albuterol (VENTOLIN HFA) 108 (90 Base) MCG/ACT inhaler Use 2 puffs every 6 hours  as needed for wheezing 3 Inhaler 0  . VENTOLIN HFA 108 (90 Base) MCG/ACT inhaler Use 2 puffs every 6 hours  as needed for wheezing 36 g 0   No facility-administered medications prior to visit.          Objective:   Physical Exam Vitals:   01/30/16 0853  BP: (!) 148/72  Pulse: (!) 107  SpO2: 94%  Weight: 150 lb (68 kg)  Height: 6' (1.829 m)   Gen: Pleasant, Thin, in no distress,  normal affect  ENT: No lesions,  mouth clear,  oropharynx clear, no postnasal drip  Neck: No JVD, no stridor  Lungs: No use of accessory muscles, bilateral diffuse expiratory wheezes  Cardiovascular: RRR, heart sounds normal, no murmur or gallops, no peripheral edema  Musculoskeletal: No deformities, no cyanosis or clubbing  Neuro: alert, non focal  Skin: Warm, no lesions or rashes     Assessment & Plan:  TOBACCO USE Discussed cessation today.  COPD (chronic obstructive pulmonary disease) with emphysema He has waxing and waning exertional  dyspnea that he treats with short prednisone tapers. His chronic dose is 10 mg daily. At this point I don't feel there is any benefit to increasing his chronic dose. If his exacerbation frequency increases then we will try it. He has been unable to tolerate daliresp in the past.    Francisco Apo, MD, PhD 01/30/2016, 9:16 AM [facility] [phone] or if no  answer [phone]

## 2016-01-30 NOTE — Assessment & Plan Note (Signed)
He has waxing and waning exertional dyspnea that he treats with short prednisone tapers. His chronic dose is 10 mg daily. At this point I don't feel there is any benefit to increasing his chronic dose. If his exacerbation frequency increases then we will try it. He has been unable to tolerate daliresp in the past.

## 2016-04-06 ENCOUNTER — Other Ambulatory Visit: Payer: Self-pay | Admitting: Emergency Medicine

## 2016-05-07 ENCOUNTER — Encounter: Payer: Self-pay | Admitting: Family Medicine

## 2016-05-31 ENCOUNTER — Other Ambulatory Visit: Payer: Self-pay | Admitting: Emergency Medicine

## 2016-06-05 ENCOUNTER — Other Ambulatory Visit (INDEPENDENT_AMBULATORY_CARE_PROVIDER_SITE_OTHER): Payer: Medicare Other

## 2016-06-05 DIAGNOSIS — E785 Hyperlipidemia, unspecified: Secondary | ICD-10-CM | POA: Diagnosis not present

## 2016-06-05 DIAGNOSIS — R319 Hematuria, unspecified: Secondary | ICD-10-CM | POA: Diagnosis not present

## 2016-06-05 DIAGNOSIS — Z125 Encounter for screening for malignant neoplasm of prostate: Secondary | ICD-10-CM | POA: Diagnosis not present

## 2016-06-05 LAB — HEPATIC FUNCTION PANEL
ALBUMIN: 4.4 g/dL (ref 3.5–5.2)
ALK PHOS: 59 U/L (ref 39–117)
ALT: 21 U/L (ref 0–53)
AST: 22 U/L (ref 0–37)
Bilirubin, Direct: 0.1 mg/dL (ref 0.0–0.3)
TOTAL PROTEIN: 6.9 g/dL (ref 6.0–8.3)
Total Bilirubin: 0.7 mg/dL (ref 0.2–1.2)

## 2016-06-05 LAB — POC URINALSYSI DIPSTICK (AUTOMATED)
Bilirubin, UA: NEGATIVE
GLUCOSE UA: NEGATIVE
Ketones, UA: NEGATIVE
Leukocytes, UA: NEGATIVE
Nitrite, UA: NEGATIVE
Protein, UA: NEGATIVE
SPEC GRAV UA: 1.01
UROBILINOGEN UA: 0.2
pH, UA: 5.5

## 2016-06-05 LAB — BASIC METABOLIC PANEL
BUN: 11 mg/dL (ref 6–23)
CALCIUM: 9.4 mg/dL (ref 8.4–10.5)
CO2: 34 mEq/L — ABNORMAL HIGH (ref 19–32)
Chloride: 93 mEq/L — ABNORMAL LOW (ref 96–112)
Creatinine, Ser: 0.91 mg/dL (ref 0.40–1.50)
GFR: 87.87 mL/min (ref >60.00)
Glucose, Bld: 107 mg/dL — ABNORMAL HIGH (ref 70–99)
POTASSIUM: 4.7 meq/L (ref 3.5–5.1)
Sodium: 137 mEq/L (ref 135–145)

## 2016-06-05 LAB — CBC WITH DIFFERENTIAL/PLATELET
BASOS ABS: 0 10*3/uL (ref 0.0–0.1)
Basophils Relative: 0.1 % (ref 0.0–3.0)
Eosinophils Absolute: 0.1 10*3/uL (ref 0.0–0.7)
Eosinophils Relative: 1.1 % (ref 0.0–5.0)
HEMATOCRIT: 45.6 % (ref 39.0–52.0)
HEMOGLOBIN: 15.4 g/dL (ref 13.0–17.0)
LYMPHS PCT: 6.8 % — AB (ref 12.0–46.0)
Lymphs Abs: 0.9 10*3/uL (ref 0.7–4.0)
MCHC: 33.8 g/dL (ref 30.0–36.0)
MCV: 98.5 fl (ref 78.0–100.0)
MONOS PCT: 7.2 % (ref 3.0–12.0)
Monocytes Absolute: 0.9 10*3/uL (ref 0.1–1.0)
Neutro Abs: 10.8 10*3/uL — ABNORMAL HIGH (ref 1.4–7.7)
Neutrophils Relative %: 84.8 % — ABNORMAL HIGH (ref 43.0–77.0)
Platelets: 289 10*3/uL (ref 150.0–400.0)
RBC: 4.64 Mil/uL (ref 4.22–5.81)
RDW: 12.8 % (ref 11.5–15.5)
WBC: 12.7 10*3/uL — ABNORMAL HIGH (ref 4.0–10.5)

## 2016-06-05 LAB — LIPID PANEL
Cholesterol: 259 mg/dL — ABNORMAL HIGH (ref 0–200)
HDL: 125.7 mg/dL (ref >39.00)
LDL Cholesterol: 121 mg/dL — ABNORMAL HIGH (ref 0–99)
NONHDL: 133.54
Total CHOL/HDL Ratio: 2
Triglycerides: 64 mg/dL (ref 0.0–149.0)
VLDL: 12.8 mg/dL (ref 0.0–40.0)

## 2016-06-05 LAB — TSH: TSH: 1.54 u[IU]/mL (ref 0.35–4.50)

## 2016-06-05 LAB — PSA: PSA: 1.17 ng/mL (ref 0.10–4.00)

## 2016-06-05 LAB — URINALYSIS, MICROSCOPIC ONLY

## 2016-06-12 ENCOUNTER — Ambulatory Visit (INDEPENDENT_AMBULATORY_CARE_PROVIDER_SITE_OTHER): Payer: Medicare Other | Admitting: Family Medicine

## 2016-06-12 ENCOUNTER — Encounter: Payer: Self-pay | Admitting: Family Medicine

## 2016-06-12 VITALS — BP 114/72 | HR 102 | Temp 98.0°F | Ht 70.5 in | Wt 156.2 lb

## 2016-06-12 DIAGNOSIS — R739 Hyperglycemia, unspecified: Secondary | ICD-10-CM

## 2016-06-12 DIAGNOSIS — Z0000 Encounter for general adult medical examination without abnormal findings: Secondary | ICD-10-CM

## 2016-06-12 DIAGNOSIS — E785 Hyperlipidemia, unspecified: Secondary | ICD-10-CM

## 2016-06-12 HISTORY — DX: Hyperglycemia, unspecified: R73.9

## 2016-06-12 NOTE — Patient Instructions (Addendum)
10 year risk of heart attack or stroke 16%. Would at minimum start aspirin '81mg'$ . You agreed to consider cholesterol medicine- and reach out to me if you become ready  Look up mediterranean or dash diet- these are healthy diets that can reduce heart and diabetes risks  Schedule lab visit in 6 months See me a few days later  Between now and then would --> I would also like for you to sign up for an annual wellness visit with our nurse, Manuela Schwartz, who specializes in the annual wellness exam. This is a free benefit under medicare that may help [place] find additional ways to help you. Some highlights are reviewing medications, lifestyle, and doing a dementia screen.

## 2016-06-12 NOTE — Progress Notes (Addendum)
Phone: [phone]  Subjective:  Patient presents today for their annual physical. Chief complaint-noted.   See problem oriented charting- ROS- full  review of systems was completed and negative except for: baseline shortness of breath- slowly getting worse with time  The following were reviewed and entered/updated in epic: Past Medical History:  Diagnosis Date  . Chest x-ray abnormality   . Chronic rhinitis    -Sinus Ct 08/01/2009 >> Bilateral maxillary sinusitis with some mucosal thickeningin the sphenoid and frontal sinuses as well with air fluid levels present -chronic rhinitis flyer Aug 04, 2009  . COPD (chronic obstructive pulmonary disease) [facility])    PFT's rec Jul 17, 2009  . Onychomycosis    Dr. Judi Cong   Patient Active Problem List   Diagnosis Date Noted  . TOBACCO USE 08/23/2008    Priority: High  . COPD (chronic obstructive pulmonary disease) with emphysema ([place]) 08/23/2008    Priority: High  . Hyperlipidemia 07/22/2014    Priority: Medium  . Onychomycosis 07/22/2014    Priority: Low  . Left ankle swelling 07/22/2014    Priority: Low  . History of skin cancer 05/17/2014    Priority: Low  . Chronic rhinitis 10/28/2012    Priority: Low  . Pulmonary nodule 09/23/2011    Priority: Low  . History of colonic polyps 08/23/2008    Priority: Low  . Hyperglycemia 06/12/2016   Past Surgical History:  Procedure Laterality Date  . APPENDECTOMY    . TONSILLECTOMY      Family History  Problem Relation Age of Onset  . Heart disease Mother     CABG in her 35s, nonsmoker  . Cancer Mother   . Stroke Father   . Heart disease Father     Died of MI at age 47, smoker  . Hepatitis Sister   . Coronary artery disease Other     male 1st degree relative <60    Medications- reviewed and updated Current Outpatient Prescriptions  Medication Sig Dispense Refill  . albuterol (VENTOLIN HFA) 108 (90 Base) MCG/ACT inhaler Use 2 puffs every 6 hours  as needed for wheezing 3  Inhaler 3  . budesonide-formoterol (SYMBICORT) 160-4.5 MCG/ACT inhaler Inhale 2 puffs into the lungs 2 (two) times daily. 3 Inhaler 3  . fluticasone (FLONASE) 50 MCG/ACT nasal spray Place 2 sprays into both nostrils daily. 48 g 3  . montelukast (SINGULAIR) 10 MG tablet Take 1 tablet (10 mg total) by mouth at bedtime. 90 tablet 1  . predniSONE (DELTASONE) 10 MG tablet TAKE 1 TABLET BY MOUTH  DAILY WITH BREAKFAST 90 tablet 1  . SPIRIVA RESPIMAT 2.5 MCG/ACT AERS USE 2 INHALATIONS INTO THE  LUNGS DAILY 12 g 5  . terbinafine (LAMISIL) 250 MG tablet Take 250 mg by mouth See admin instructions. Take 1 tablet daily for 7 days every month    . triamterene-hydrochlorothiazide (MAXZIDE-25) 37.5-25 MG tablet TAKE 1 TABLET BY MOUTH  DAILY AS NEEDED. USE  SPARINGLY 90 tablet 1   No current facility-administered medications for this visit.     Allergies-reviewed and updated No Known Allergies  Social History   Social History  . Marital status: Married    Spouse name: N/A  . Number of children: 2  . Years of education: N/A   Occupational History  .  [employer]   Social History Main Topics  . Smoking status: Current Every Day Smoker    Packs/day: 2.00    Years: 10.00    Types: Pipe, Cigarettes  . Smokeless  tobacco: None     Comment: Smokes pipe up to 2-3 times a day  . Alcohol use 10.8 oz/week    18 Cans of beer per week     Comment: daily  . Drug use: No  . Sexual activity: Not Asked   Other Topics Concern  . None   Social History Narrative   Married (wife Katy Apo patient of Dr. Yong Channel) with children (2-son and daughter). 1 grandson.       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       Hobbies: fishing, projects at home    Objective: BP 114/72 (BP Location: Left Arm, Patient Position: Sitting, Cuff Size: Large)   Pulse (!) 102   Temp 98 F (36.7 C) (Oral)   Ht 5' 10.5" (1.791 m)   Wt 156 lb 3.2 oz (70.9 kg)   SpO2 95%   BMI 22.10 kg/m  Gen: NAD,  resting comfortably HEENT: Mucous membranes are moist. Oropharynx normal CV: RRR no murmurs rubs or gallops. HR high normal Lungs: occasional wheeze. No crackles. Prolonged expiratory phase Abdomen: soft/nontender/nondistended/normal bowel sounds. No rebound or guarding.  Ext: 1+ edema Skin: warm, dry Neuro: grossly normal, moves all extremities, PERRLA Rectal: normal tone, diffusely enlarged prostate, no masses or tenderness   Assessment/Plan:  70 y.o. male presenting for annual physical.  Health Maintenance counseling: 1. Anticipatory guidance: Patient counseled regarding regular dental exams, eye exams, wearing seatbelts.  2. Risk factor reduction:  Advised patient of need for regular exercise (limited by lungs unfortunately) and diet rich and fruits and vegetables to reduce risk of heart attack and stroke.  3. Immunizations/screenings/ancillary studies Immunization History  Administered Date(s) Administered  . Influenza Split 04/24/2012  . Influenza Whole 05/08/2009, 04/08/2011  . Influenza,inj,Quad PF,36+ Mos 04/27/2013, 04/15/2014, 04/24/2015  . Influenza-Unspecified 05/13/2016  . Pneumococcal Conjugate-13 02/25/2014  . Pneumococcal Polysaccharide-23 08/15/2011  . Td 02/21/2010  . Zoster 01/29/2012  4. Prostate cancer screening-  low risk PSA trend and rectal . Likely bph, nocturia once nightly Lab Results  Component Value Date   PSA 1.17 06/05/2016   PSA 0.71 02/25/2014   PSA 2.42 02/09/2013   5. Colon cancer screening - 04/02/10 with 5-10 year follow up planned but with lung issues likely stopping screening per discussion with Dr. Earlean Shawl 6. Skin cancer screening- Dr. Ronnald Ramp yearly. Chronic terbinafine  Status of chronic or acute concerns   Tobacco/lung cancer screening- advised cessation, not interested. Will ask Dr. Lamonte Sakai about lung cancer screening program- with severity of his lung disease not sure of benefit  COPD- on symbicort and spiriva- controlled reasonably  well. Also sees Dr. Lamonte Sakai. Prednisone '10mg'$  daily. Also on singulair for allergies and flonase. Feels like things gradually progressing with time.   HTN- controlled when fluid status contolled. on triamterene-hctz 37.5-'25mg'$  sparingly  Right ear cerumen impaction- irrigation completed By Roselyn Reef with success  Hyperlipidemia 10 year risk of heart attack or stroke 16%. Would at minimum start aspirin '81mg'$ . You agreed to consider cholesterol medicine- and reach out to me if you become ready  With wbc elevation will see back 6 months with cbc, cmp, direct ldl, total chol  Hyperglycemia Fasting sugar 107- discussed healthy eating habits and diabetes prevention also related to lipids- would focus on a mediterranean type diet.   6 months in person, labs before. awv advised before then.  Orders Placed This Encounter  Procedures  . CBC with Differential/Platelet    Standing Status:   Future  Standing Expiration Date:   06/12/2017  . Comprehensive metabolic panel    [place]    Standing Status:   Future    Standing Expiration Date:   06/12/2017  . LDL cholesterol, direct    [place]    Standing Status:   Future    Standing Expiration Date:   06/12/2017  . Cholesterol, Total    Standing Status:   Future    Standing Expiration Date:   06/12/2017   Return precautions advised.   Garret Reddish, MD

## 2016-06-12 NOTE — Assessment & Plan Note (Signed)
Fasting sugar 107- discussed healthy eating habits and diabetes prevention also related to lipids- would focus on a mediterranean type diet.

## 2016-06-12 NOTE — Assessment & Plan Note (Addendum)
10 year risk of heart attack or stroke 16%. Would at minimum start aspirin '81mg'$ . You agreed to consider cholesterol medicine- and reach out to me if you become ready  With wbc elevation will see back 6 months with cbc, cmp, direct ldl, total chol

## 2016-06-12 NOTE — Progress Notes (Signed)
Pre visit review using our clinic review tool, if applicable. No additional management support is needed unless otherwise documented below in the visit note. 

## 2016-06-25 ENCOUNTER — Encounter: Payer: Self-pay | Admitting: Emergency Medicine

## 2016-06-25 ENCOUNTER — Ambulatory Visit (INDEPENDENT_AMBULATORY_CARE_PROVIDER_SITE_OTHER): Payer: Medicare Other | Admitting: Emergency Medicine

## 2016-06-25 DIAGNOSIS — F17200 Nicotine dependence, unspecified, uncomplicated: Secondary | ICD-10-CM | POA: Diagnosis not present

## 2016-06-25 DIAGNOSIS — J438 Other emphysema: Secondary | ICD-10-CM | POA: Diagnosis not present

## 2016-06-25 NOTE — Assessment & Plan Note (Addendum)
Continues to smoke. No consideration for stopping at this time. Discussed with him briefly lung cancer screening. I do not believe he is a good candidate because he would not tolerate surgical resection. Defer this

## 2016-06-25 NOTE — Progress Notes (Signed)
Subjective:    Patient ID: Francisco [name], male    DOB: [birth year], 70 y.o.   MRN: [id number]  HPI 70 year old man, active smoker (a pipe about 2x a day), who has been followed by Dr [name] for severe COPD. Personal and reviewed his prior poor function testing from 10/02/09 that shows severe obstruction with an FEV1 of 1.2 L or 34% predicted and a positive bronchodilator response. He's been managed on maximal bronchodilator therapy, prednisone 10 mg daily chronically, and azithromycin 3 days a week.   He states that he is doing "ok" but he knows that his breathing is slowly worsening, now cannot walk a short distance without dyspnea. He is feeling the need to nap. He had walking oximetry within the last year. He has sinus congestion - is on singulair, flonase. He occasionally uses bursts of his prednisone when sx dictate. He averages flares about once a month. Much less active now, retired in the last year.   ROV 09/11/15 -- follow-up visit for COPD. He's been managed on chronic prednisone 10 mg daily as well as Spiriva and Symbicort. At her last visit we continue these medications and we discussed smoking cessation. He has LLE edema (doppler negative in the past). He is on Maxide, presrcibed by Dr [name].  He remains on Maxide. He is having progressive worsening in exertional SOB, happens when he walks, takes a shower. He doesn't exercise much. He increases his pred as needed, has done some short tapers about every month. Smoking about a bowl full a day.   ROV 01/30/16 -- patient with a history of tobacco use, COPD. He's been on chronic prednisone, Symbicort, Spiriva. He feels that he is a bit more limited over the last several months. Especially the last week when it has been hot and humid. He increases his prednisone based on how he is feeling - averages a short taper about once a month. Helps w his exertional SOB. He has some cough daily that improves also with pred. He has intermittent wheeze. No abx  since last time. His sinus drainage is under good control. He is no longer taking the maxide every day, has changed to prn based on his leg swelling.   ROV 06/25/16 -- follow up visit for tobacco, COPD maintained on chronic pred, symbicort, spiriva. He continues to note progressive DOE, probably w less exertion than prior visit. He up-titrates his pred about once a month. He is using albuterol about 6 puffs a day.  He uses more pred when he gets more mucous. He smokes a pipe, no plans to stop.       CAT Score 10/12/2013 11/25/2012  Total CAT Score 21 21      Review of Systems As per HPI  Past Medical History:  Diagnosis Date  . Chest x-ray abnormality   . Chronic rhinitis    -Sinus Ct 08/01/2009 >> Bilateral maxillary sinusitis with some mucosal thickeningin the sphenoid and frontal sinuses as well with air fluid levels present -chronic rhinitis flyer Aug 04, 2009  . COPD (chronic obstructive pulmonary disease) [facility])    PFT's rec Jul 17, 2009  . Onychomycosis    Dr. Judi Cong     Family History  Problem Relation Age of Onset  . Heart disease Mother     CABG in her 84s, nonsmoker  . Cancer Mother   . Stroke Father   . Heart disease Father     Died of MI at age 76, smoker  . Hepatitis  Sister   . Coronary artery disease Other     male 1st degree relative <60     Social History   Social History  . Marital status: Married    Spouse name: N/A  . Number of children: 2  . Years of education: N/A   Occupational History  .  [name]   Social History Main Topics  . Smoking status: Current Every Day Smoker    Packs/day: 2.00    Years: 10.00    Types: Pipe, Cigarettes  . Smokeless tobacco: Not on file     Comment: Smokes pipe up to 2-3 times a day  . Alcohol use 10.8 oz/week    18 Cans of beer per week     Comment: daily  . Drug use: No  . Sexual activity: Not on file   Other Topics Concern  . Not on file   Social History Narrative   Married (wife Francisco [name] patient of Dr.  Yong Channel) with children (2-son and daughter). 1 grandson.       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       Hobbies: fishing, projects at home     No Known Allergies   Outpatient Medications Prior to Visit  Medication Sig Dispense Refill  . albuterol (VENTOLIN HFA) 108 (90 Base) MCG/ACT inhaler Use 2 puffs every 6 hours  as needed for wheezing 3 Inhaler 3  . budesonide-formoterol (SYMBICORT) 160-4.5 MCG/ACT inhaler Inhale 2 puffs into the lungs 2 (two) times daily. 3 Inhaler 3  . fluticasone (FLONASE) 50 MCG/ACT nasal spray Place 2 sprays into both nostrils daily. 48 g 3  . montelukast (SINGULAIR) 10 MG tablet Take 1 tablet (10 mg total) by mouth at bedtime. 90 tablet 1  . predniSONE (DELTASONE) 10 MG tablet TAKE 1 TABLET BY MOUTH  DAILY WITH BREAKFAST 90 tablet 1  . SPIRIVA RESPIMAT 2.5 MCG/ACT AERS USE 2 INHALATIONS INTO THE  LUNGS DAILY 12 g 5  . terbinafine (LAMISIL) 250 MG tablet Take 250 mg by mouth See admin instructions. Take 1 tablet daily for 7 days every month    . triamterene-hydrochlorothiazide (MAXZIDE-25) 37.5-25 MG tablet TAKE 1 TABLET BY MOUTH  DAILY AS NEEDED. USE  SPARINGLY 90 tablet 1   No facility-administered medications prior to visit.          Objective:   Physical Exam Vitals:   06/25/16 0900  BP: 120/80  Pulse: (!) 102  SpO2: 95%  Weight: 157 lb (71.2 kg)  Height: 6' (1.829 m)   Gen: Pleasant, Thin, in no distress,  normal affect  ENT: No lesions,  mouth clear,  oropharynx clear, no postnasal drip  Neck: No JVD, no stridor  Lungs: No use of accessory muscles, bilateral diffuse soft expiratory wheezes  Cardiovascular: RRR, heart sounds normal, no murmur or gallops, no peripheral edema  Musculoskeletal: No deformities, no cyanosis or clubbing  Neuro: alert, non focal  Skin: Warm, no lesions or rashes     Assessment & Plan:  TOBACCO USE Continues to smoke. No consideration for stopping at this time.  Discussed with him briefly lung cancer screening. I do not believe he is a good candidate because he would not tolerate surgical resection. Defer this  COPD (chronic obstructive pulmonary disease) with emphysema Please continue prednisone 10 mg daily with adjustments per your usual routine Please continue Symbicort and Spiriva as ordered Please continue albuterol 2 puffs as needed for shortness of breath Flu shot up to  date.  Follow with Dr Lamonte Sakai in 6 months or sooner if you have any problems   [name], MD, PhD 06/25/2016, 9:21 AM [facility] [phone] or if no answer [phone]

## 2016-06-25 NOTE — Assessment & Plan Note (Signed)
Please continue prednisone 10 mg daily with adjustments per your usual routine Please continue Symbicort and Spiriva as ordered Please continue albuterol 2 puffs as needed for shortness of breath Flu shot up to date.  Follow with Dr Lamonte Sakai in 6 months or sooner if you have any problems

## 2016-06-25 NOTE — Patient Instructions (Signed)
Please continue prednisone 10 mg daily with adjustments per your usual routine Please continue Symbicort and Spiriva as ordered Please continue albuterol 2 puffs as needed for shortness of breath Flu shot up to date.  Follow with Dr Lamonte Sakai in 6 months or sooner if you have any problems

## 2016-07-06 ENCOUNTER — Other Ambulatory Visit: Payer: Self-pay | Admitting: Emergency Medicine

## 2016-10-22 ENCOUNTER — Other Ambulatory Visit: Payer: Self-pay | Admitting: Emergency Medicine

## 2016-12-06 ENCOUNTER — Other Ambulatory Visit (INDEPENDENT_AMBULATORY_CARE_PROVIDER_SITE_OTHER): Payer: Medicare Other

## 2016-12-06 DIAGNOSIS — E785 Hyperlipidemia, unspecified: Secondary | ICD-10-CM

## 2016-12-06 LAB — CBC WITH DIFFERENTIAL/PLATELET
BASOS ABS: 0.1 10*3/uL (ref 0.0–0.1)
Basophils Relative: 0.5 % (ref 0.0–3.0)
Eosinophils Absolute: 0.2 10*3/uL (ref 0.0–0.7)
Eosinophils Relative: 1.2 % (ref 0.0–5.0)
HCT: 45.1 % (ref 39.0–52.0)
Hemoglobin: 15.4 g/dL (ref 13.0–17.0)
LYMPHS ABS: 0.8 10*3/uL (ref 0.7–4.0)
Lymphocytes Relative: 6.3 % — ABNORMAL LOW (ref 12.0–46.0)
MCHC: 34.2 g/dL (ref 30.0–36.0)
MCV: 96.9 fl (ref 78.0–100.0)
MONO ABS: 1 10*3/uL (ref 0.1–1.0)
MONOS PCT: 7.9 % (ref 3.0–12.0)
NEUTROS ABS: 10.4 10*3/uL — AB (ref 1.4–7.7)
NEUTROS PCT: 84.1 % — AB (ref 43.0–77.0)
PLATELETS: 302 10*3/uL (ref 150.0–400.0)
RBC: 4.66 Mil/uL (ref 4.22–5.81)
RDW: 12.8 % (ref 11.5–15.5)
WBC: 12.4 10*3/uL — ABNORMAL HIGH (ref 4.0–10.5)

## 2016-12-06 LAB — COMPREHENSIVE METABOLIC PANEL
ALT: 19 U/L (ref 0–53)
AST: 20 U/L (ref 0–37)
Albumin: 4.4 g/dL (ref 3.5–5.2)
Alkaline Phosphatase: 62 U/L (ref 39–117)
BUN: 13 mg/dL (ref 6–23)
CO2: 33 meq/L — AB (ref 19–32)
Calcium: 9.5 mg/dL (ref 8.4–10.5)
Chloride: 92 mEq/L — ABNORMAL LOW (ref 96–112)
Creatinine, Ser: 0.95 mg/dL (ref 0.40–1.50)
GFR: 83.49 mL/min (ref >60.00)
GLUCOSE: 91 mg/dL (ref 70–99)
POTASSIUM: 4.2 meq/L (ref 3.5–5.1)
SODIUM: 133 meq/L — AB (ref 135–145)
Total Bilirubin: 1 mg/dL (ref 0.2–1.2)
Total Protein: 6.8 g/dL (ref 6.0–8.3)

## 2016-12-06 LAB — LDL CHOLESTEROL, DIRECT: Direct LDL: 75 mg/dL

## 2016-12-06 LAB — CHOLESTEROL, TOTAL: Cholesterol: 241 mg/dL — ABNORMAL HIGH (ref 0–200)

## 2016-12-11 ENCOUNTER — Ambulatory Visit: Payer: Medicare Other | Admitting: Family Medicine

## 2016-12-19 ENCOUNTER — Encounter: Payer: Self-pay | Admitting: Family Medicine

## 2016-12-19 ENCOUNTER — Ambulatory Visit (INDEPENDENT_AMBULATORY_CARE_PROVIDER_SITE_OTHER): Payer: Medicare Other | Admitting: Family Medicine

## 2016-12-19 VITALS — BP 128/66 | HR 98 | Temp 98.3°F | Ht 72.0 in | Wt 151.0 lb

## 2016-12-19 DIAGNOSIS — E785 Hyperlipidemia, unspecified: Secondary | ICD-10-CM | POA: Diagnosis not present

## 2016-12-19 DIAGNOSIS — M25472 Effusion, left ankle: Secondary | ICD-10-CM | POA: Diagnosis not present

## 2016-12-19 DIAGNOSIS — Z23 Encounter for immunization: Secondary | ICD-10-CM | POA: Diagnosis not present

## 2016-12-19 DIAGNOSIS — J438 Other emphysema: Secondary | ICD-10-CM | POA: Diagnosis not present

## 2016-12-19 DIAGNOSIS — D72829 Elevated white blood cell count, unspecified: Secondary | ICD-10-CM | POA: Insufficient documentation

## 2016-12-19 DIAGNOSIS — F17200 Nicotine dependence, unspecified, uncomplicated: Secondary | ICD-10-CM

## 2016-12-19 DIAGNOSIS — R739 Hyperglycemia, unspecified: Secondary | ICD-10-CM

## 2016-12-19 HISTORY — DX: Elevated white blood cell count, unspecified: D72.829

## 2016-12-19 NOTE — Patient Instructions (Addendum)
Come fasting to next visit and we will update labs  No changes today. CHolesterol and blood sugar look much better- great job with diet change- dont want you losing any more weight though!

## 2016-12-19 NOTE — Assessment & Plan Note (Signed)
Hyperglycemia- resolved on labs with improved diet. Watch cbg only

## 2016-12-19 NOTE — Assessment & Plan Note (Signed)
S: Wbc elevation last 3 sets of labs with left shift. Rest of CBC reassuring. A/P: discussed option hematology referral vs. Repeat CBC diff and pathologist smear review at follow up physical- he opts for 2nd option. I am agreeable to this as other cell lines ok.

## 2016-12-19 NOTE — Assessment & Plan Note (Signed)
Severe copd- not candidate for lung cancer screening per pulmonology. Smokes pipe daily still 2x a day (down from 3). Advised complete cessation

## 2016-12-19 NOTE — Assessment & Plan Note (Signed)
COPD- symbicort and spiriva as well as chronic prednisone, singulair. Last saw pulmonary in December. He uses prednisone 4,3,2,1 of 10mg  with flare ups- possible he did this before most recent labs with WBC elevation

## 2016-12-19 NOTE — Progress Notes (Signed)
Subjective:  Francisco [name] is a 71 y.o. year old very pleasant male patient who presents for/with See problem oriented charting ROS- stable shortness of breath In last week. No chest pain. No fever or chills. Chronic cough.    Past Medical History-  Patient Active Problem List   Diagnosis Date Noted  . TOBACCO USE 08/23/2008    Priority: High  . COPD (chronic obstructive pulmonary disease) with emphysema ([place]) 08/23/2008    Priority: High  . Hyperglycemia 06/12/2016    Priority: Medium  . Hyperlipidemia 07/22/2014    Priority: Medium  . Onychomycosis 07/22/2014    Priority: Low  . Left ankle swelling 07/22/2014    Priority: Low  . History of skin cancer 05/17/2014    Priority: Low  . Chronic rhinitis 10/28/2012    Priority: Low  . Pulmonary nodule 09/23/2011    Priority: Low  . History of colonic polyps 08/23/2008    Priority: Low  . Leukocytosis 12/19/2016    Medications- reviewed and updated Current Outpatient Prescriptions  Medication Sig Dispense Refill  . albuterol (VENTOLIN HFA) 108 (90 Base) MCG/ACT inhaler Use 2 puffs every 6 hours  as needed for wheezing 3 Inhaler 3  . azithromycin (ZITHROMAX) 250 MG tablet TAKE 1 TABLET ON MONDAY,  WEDNESDAY, AND FRIDAY 39 tablet 1  . fluticasone (FLONASE) 50 MCG/ACT nasal spray Place 2 sprays into both nostrils daily. 48 g 3  . montelukast (SINGULAIR) 10 MG tablet TAKE 1 TABLET BY MOUTH AT  BEDTIME 90 tablet 2  . predniSONE (DELTASONE) 10 MG tablet TAKE 1 TABLET BY MOUTH  DAILY WITH BREAKFAST 90 tablet 1  . SPIRIVA RESPIMAT 2.5 MCG/ACT AERS USE 2 INHALATIONS INTO THE  LUNGS DAILY 12 g 5  . SYMBICORT 160-4.5 MCG/ACT inhaler USE 2 PUFFS BY MOUTH TWO  TIMES DAILY 30.6 g 3  . terbinafine (LAMISIL) 250 MG tablet Take 250 mg by mouth See admin instructions. Take 1 tablet daily for 7 days every month    . triamterene-hydrochlorothiazide (MAXZIDE-25) 37.5-25 MG tablet TAKE 1 TABLET BY MOUTH  DAILY AS NEEDED. USE  SPARINGLY 90 tablet 1    No current facility-administered medications for this visit.     Objective: BP 128/66 (BP Location: Left Arm, Patient Position: Sitting, Cuff Size: Large)   Pulse 98   Temp 98.3 F (36.8 C) (Oral)   Ht 6' (1.829 m)   Wt 151 lb (68.5 kg)   SpO2 99%   BMI 20.48 kg/m  Gen: NAD, resting comfortably CV: RRR no murmurs rubs or gallops Lungs: diffuse wheeze and prolonged expiratory phase Abdomen: soft/nontender/nondistended/normal bowel sounds. No rebound or guarding.  Ext: 1+ edema L >R Skin: warm, dry  Assessment/Plan:  TOBACCO USE  Severe copd- not candidate for lung cancer screening per pulmonology. Smokes pipe daily still 2x a day (down from 3). Advised complete cessation  COPD (chronic obstructive pulmonary disease) with emphysema COPD- symbicort and spiriva as well as chronic prednisone, singulair. Last saw pulmonary in December. He uses prednisone 4,3,2,1 of 10mg  with flare ups- possible he did this before most recent labs with WBC elevation. Sees Dr. Lamonte Sakai later this month   Left ankle swelling S: controlled on triamterine hctz 37.5-25mg  just as needed for edema. Usually doing every other day- does not get rid of chronic edema BP Readings from Last 3 Encounters:  12/19/16 128/66  06/25/16 120/80  06/12/16 114/72  A/P: continue mainly every other day use  Hyperlipidemia Declined statin last visit with 16% 10  year risk--> improved diet and LDL down to 75. Tried aspirin but severe bruising so stopped- pletelets ok  Hyperglycemia Hyperglycemia- resolved on labs with improved diet. Watch cbg only  Leukocytosis S: Wbc elevation last 3 sets of labs with left shift. Rest of CBC reassuring. A/P: discussed option hematology referral vs. Repeat CBC diff and pathologist smear review at follow up physical- he opts for 2nd option. I am agreeable to this as other cell lines ok.    Return in about 6 months (around 06/20/2017) for physical.  Orders Placed This Encounter   Procedures  . Pneumococcal polysaccharide vaccine 23-valent greater than or equal to 2yo subcutaneous/IM   Return precautions advised.  Garret Reddish, MD

## 2016-12-19 NOTE — Assessment & Plan Note (Signed)
S: controlled on triamterine hctz 37.5-25mg  just as needed for edema. Usually doing every other day- does not get rid of chronic edema BP Readings from Last 3 Encounters:  12/19/16 128/66  06/25/16 120/80  06/12/16 114/72  A/P: continue mainly every other day use

## 2016-12-19 NOTE — Assessment & Plan Note (Signed)
Declined statin last visit with 16% 10 year risk--> improved diet and LDL down to 75. Tried aspirin but severe bruising so stopped- pletelets ok

## 2016-12-30 ENCOUNTER — Ambulatory Visit (INDEPENDENT_AMBULATORY_CARE_PROVIDER_SITE_OTHER): Payer: Medicare Other | Admitting: Emergency Medicine

## 2016-12-30 ENCOUNTER — Encounter: Payer: Self-pay | Admitting: Emergency Medicine

## 2016-12-30 DIAGNOSIS — J438 Other emphysema: Secondary | ICD-10-CM

## 2016-12-30 DIAGNOSIS — F17200 Nicotine dependence, unspecified, uncomplicated: Secondary | ICD-10-CM

## 2016-12-30 DIAGNOSIS — J310 Chronic rhinitis: Secondary | ICD-10-CM

## 2016-12-30 NOTE — Assessment & Plan Note (Signed)
Continue singulair, continue fluticasone nasal spray

## 2016-12-30 NOTE — Patient Instructions (Signed)
Please continue your current medications as you have been taking them, including your prednisone.  Get your flu shot in the fall Follow with Dr Lamonte Sakai in 6 months or sooner if you have any problems

## 2016-12-30 NOTE — Progress Notes (Signed)
Subjective:    Patient ID: Francisco [name], male    DOB: 06-27-1946, 71 y.o.   MRN: [id number]  HPI 71 year old man, active smoker (a pipe about 2x a day), who has been followed by Dr [name] for severe COPD. Personal and reviewed his prior poor function testing from 10/02/09 that shows severe obstruction with an FEV1 of 1.2 L or 34% predicted and a positive bronchodilator response. He's been managed on maximal bronchodilator therapy, prednisone 10 mg daily chronically, and azithromycin 3 days a week.   He states that he is doing "ok" but he knows that his breathing is slowly worsening, now cannot walk a short distance without dyspnea. He is feeling the need to nap. He had walking oximetry within the last year. He has sinus congestion - is on singulair, flonase. He occasionally uses bursts of his prednisone when sx dictate. He averages flares about once a month. Much less active now, retired in the last year.   ROV 09/11/15 -- follow-up visit for COPD. He's been managed on chronic prednisone 10 mg daily as well as Spiriva and Symbicort. At her last visit we continue these medications and we discussed smoking cessation. He has LLE edema (doppler negative in the past). He is on Maxide, presrcibed by Dr [name].  He remains on Maxide. He is having progressive worsening in exertional SOB, happens when he walks, takes a shower. He doesn't exercise much. He increases his pred as needed, has done some short tapers about every month. Smoking about a bowl full a day.   ROV 01/30/16 -- patient with a history of tobacco use, COPD. He's been on chronic prednisone, Symbicort, Spiriva. He feels that he is a bit more limited over the last several months. Especially the last week when it has been hot and humid. He increases his prednisone based on how he is feeling - averages a short taper about once a month. Helps w his exertional SOB. He has some cough daily that improves also with pred. He has intermittent wheeze. No abx  since last time. His sinus drainage is under good control. He is no longer taking the maxide every day, has changed to prn based on his leg swelling.   ROV 06/25/16 -- follow up visit for tobacco, COPD maintained on chronic pred, symbicort, spiriva. He continues to note progressive DOE, probably w less exertion than prior visit. He up-titrates his pred about once a month. He is using albuterol about 6 puffs a day.  He uses more pred when he gets more mucous. He smokes a pipe, no plans to stop.     ROV 12/30/16 -- patient has a history of tobacco use, COPD currently maintained on chronic prednisone, Symbicort, Spiriva. Most recent blurry function testing was March 2011 with an FEV1 of 1.2 L (34% Pred), severe obstruction. He up titrates his prednisone on his own based on symptoms. No abx since last time. He has increased pred temporrily about 4 -5 x since last time. No increases for over a month. He is smoking a pipe, not cigarettes. He is using HCTZ more frequently these days. He is on flonase and singulair. He does note that he has some slow progression of his DOE. He coughs a few times a day, clears clear sputum.    No flowsheet data found.    Review of Systems As per HPI     Objective:   Physical Exam Vitals:   12/30/16 0852 12/30/16 0853  BP:  128/70  Pulse:  [name] Kitchen)  109  SpO2:  95%  Weight: 150 lb 3.2 oz (68.1 kg)   Height: 6' (1.829 m)    Gen: Pleasant, Thin, in no distress,  normal affect  ENT: No lesions,  mouth clear,  oropharynx clear, no postnasal drip  Neck: No JVD, no stridor  Lungs: No use of accessory muscles, bilateral diffuse soft expiratory wheezes  Cardiovascular: RRR, heart sounds normal, no murmur or gallops, no peripheral edema  Musculoskeletal: No deformities, no cyanosis or clubbing  Neuro: alert, non focal  Skin: Warm, no lesions or rashes     Assessment & Plan:  TOBACCO USE Discussed cessation today. He is not ready to do this.  Chronic  rhinitis Continue singulair, continue fluticasone nasal spray  COPD (chronic obstructive pulmonary disease) with emphysema Appears to be overall stable. He does use prednisone with very brief tapers as described. He averages about once a month. He hasn't needed one in over 30 days. Although this is an unusual way to address maintenance it appears to be working for him. Continue same regimen. Continue Spiriva, Symbicort, albuterol as needed. Discussed possibly changing to a combined LABA / LAMA at some point in the future.    [name], MD, PhD 12/30/2016, 9:15 AM [facility] [phone] or if no answer [phone]

## 2016-12-30 NOTE — Assessment & Plan Note (Signed)
Discussed cessation today. He is not ready to do this.

## 2016-12-30 NOTE — Assessment & Plan Note (Signed)
Appears to be overall stable. He does use prednisone with very brief tapers as described. He averages about once a month. He hasn't needed one in over 30 days. Although this is an unusual way to address maintenance it appears to be working for him. Continue same regimen. Continue Spiriva, Symbicort, albuterol as needed. Discussed possibly changing to a combined LABA / LAMA at some point in the future.

## 2017-01-01 ENCOUNTER — Other Ambulatory Visit: Payer: Self-pay | Admitting: Emergency Medicine

## 2017-03-20 ENCOUNTER — Other Ambulatory Visit: Payer: Self-pay | Admitting: Emergency Medicine

## 2017-05-19 ENCOUNTER — Telehealth: Payer: Self-pay | Admitting: Family Medicine

## 2017-05-19 NOTE — Telephone Encounter (Signed)
Swelling in legs. He scratched a scab on his leg and a clear liquid is coming out from that spot on his swollen legs.  Wants to know if he needs an appt.  Please advise,  -LL

## 2017-05-20 NOTE — Telephone Encounter (Signed)
Scheduled patient for an appointment on 05/21/17 at 11:45

## 2017-05-21 ENCOUNTER — Ambulatory Visit (INDEPENDENT_AMBULATORY_CARE_PROVIDER_SITE_OTHER): Payer: Medicare Other | Admitting: Family Medicine

## 2017-05-21 ENCOUNTER — Encounter: Payer: Self-pay | Admitting: Family Medicine

## 2017-05-21 VITALS — BP 130/76 | HR 107 | Temp 97.6°F | Ht 72.0 in | Wt 149.0 lb

## 2017-05-21 DIAGNOSIS — I83013 Varicose veins of right lower extremity with ulcer of ankle: Secondary | ICD-10-CM

## 2017-05-21 DIAGNOSIS — L97311 Non-pressure chronic ulcer of right ankle limited to breakdown of skin: Secondary | ICD-10-CM | POA: Diagnosis not present

## 2017-05-21 NOTE — Patient Instructions (Signed)
Used silver nitrate to try to slow down the oozing  Elevate legs to heart level as much as able  Use compression stockings  Use your triamterene-hctz for next few days until stops oozing

## 2017-05-21 NOTE — Progress Notes (Signed)
Subjective:  Francisco [name] is a 71 y.o. year old very pleasant male patient who presents for/with See problem oriented charting ROS- no fever, chills, nausea, vomiting.  Baseline shortness of breath  Past Medical History-  Patient Active Problem List   Diagnosis Date Noted  . TOBACCO USE 08/23/2008    Priority: High  . COPD (chronic obstructive pulmonary disease) with emphysema ([place]) 08/23/2008    Priority: High  . Hyperglycemia 06/12/2016    Priority: Medium  . Hyperlipidemia 07/22/2014    Priority: Medium  . Onychomycosis 07/22/2014    Priority: Low  . Left ankle swelling 07/22/2014    Priority: Low  . History of skin cancer 05/17/2014    Priority: Low  . Chronic rhinitis 10/28/2012    Priority: Low  . Pulmonary nodule 09/23/2011    Priority: Low  . History of colonic polyps 08/23/2008    Priority: Low  . Leukocytosis 12/19/2016    Medications- reviewed and updated Current Outpatient Medications  Medication Sig Dispense Refill  . azithromycin (ZITHROMAX) 250 MG tablet TAKE 1 TABLET ON MONDAY,  WEDNESDAY, AND FRIDAY 39 tablet 1  . fluticasone (FLONASE) 50 MCG/ACT nasal spray USE 2 SPRAYS IN EACH  NOSTRIL DAILY 48 g 1  . montelukast (SINGULAIR) 10 MG tablet TAKE 1 TABLET BY MOUTH AT  BEDTIME 90 tablet 2  . predniSONE (DELTASONE) 10 MG tablet TAKE 1 TABLET BY MOUTH  DAILY WITH BREAKFAST 90 tablet 1  . SPIRIVA RESPIMAT 2.5 MCG/ACT AERS USE 2 INHALATIONS INTO THE  LUNGS DAILY 12 g 5  . SYMBICORT 160-4.5 MCG/ACT inhaler USE 2 PUFFS BY MOUTH TWO  TIMES DAILY 30.6 g 3  . terbinafine (LAMISIL) 250 MG tablet Take 250 mg by mouth See admin instructions. Take 1 tablet daily for 7 days every month    . triamterene-hydrochlorothiazide (MAXZIDE-25) 37.5-25 MG tablet TAKE 1 TABLET BY MOUTH  DAILY AS NEEDED. USE  SPARINGLY 90 tablet 1  . VENTOLIN HFA 108 (90 Base) MCG/ACT inhaler USE 2 PUFFS EVERY 6 HOURS  AS NEEDED FOR WHEEZING 3 Inhaler 1   No current facility-administered  medications for this visit.     Objective: BP 130/76 (BP Location: Left Arm, Patient Position: Sitting, Cuff Size: Normal)   Pulse (!) 107   Temp 97.6 F (36.4 C) (Other (Comment))   Ht 6' (1.829 m)   Wt 149 lb (67.6 kg)   SpO2 93%   BMI 20.21 kg/m  Gen: NAD, resting comfortably CV: tachycardic Lungs: audible wheeze without auscultation, barrell chest Abdomen: soft/nontender/nondistended/normal bowel sounds. No rebound or guarding.  Ext: 2+ edema bilateral L >R. On right lower inner leg there is a prior blister that has been peeled off per patient- drips at an almost constant rate clear fluid- open ulcerated area about <1 x 1 cm- probably 5 mm x 5 mm  Silver nitrate used to cauterize area and reduced dripping rate significantly then dressed with neosporin, pad.  Skin: warm, dry, venous stasis changes on legs with multiple fluid filled blisters- not warm or tender to touch  Assessment/Plan:  Venous stasis ulceration S: Patient states on Friday he picked off a blister from his leg- ever since that time he has had almost a constant drip of fluid. He notes both legs swelling more than usual and taking his triamterene-hctz daily for last 3 days. Denies pain, warmth, fever. Denies increased redness around the wound. He soaks through a 4 x 4 in less than 2-3 hours usually. Wears plastic bag  around it at night and wakes up with fluid in it A/P: Venous stasis ulcer. Discussed happy to see him back next week if not improved- he has a visit in next few weeks and prefers to wait until that time. Edema does seem worst than in the past- could consider labs or updated echocardiogram. Could also refer to Dr. Sharol Given. He has 15 mmhg compression garments- states he cannot tolerate tighter Patient Instructions  Used silver nitrate to try to slow down the oozing  Elevate legs to heart level as much as able  Use compression stockings  Use your triamterene-hctz for next few days until stops  oozing   Future Appointments  Date Time Provider [place]  06/16/2017  9:00 AM Collene Gobble, MD [facility] None  06/25/2017  8:15 AM [name], MD [facility] None   Return precautions advised.  Garret Reddish, MD

## 2017-05-27 ENCOUNTER — Other Ambulatory Visit: Payer: Self-pay | Admitting: Emergency Medicine

## 2017-06-16 ENCOUNTER — Ambulatory Visit: Payer: Medicare Other | Admitting: Emergency Medicine

## 2017-06-25 ENCOUNTER — Encounter: Payer: Self-pay | Admitting: Family Medicine

## 2017-06-25 ENCOUNTER — Ambulatory Visit (INDEPENDENT_AMBULATORY_CARE_PROVIDER_SITE_OTHER): Payer: Medicare Other | Admitting: Family Medicine

## 2017-06-25 VITALS — BP 128/78 | HR 111 | Temp 97.3°F | Ht 72.0 in | Wt 148.4 lb

## 2017-06-25 DIAGNOSIS — M25472 Effusion, left ankle: Secondary | ICD-10-CM | POA: Diagnosis not present

## 2017-06-25 DIAGNOSIS — N401 Enlarged prostate with lower urinary tract symptoms: Secondary | ICD-10-CM | POA: Diagnosis not present

## 2017-06-25 DIAGNOSIS — R3912 Poor urinary stream: Secondary | ICD-10-CM

## 2017-06-25 DIAGNOSIS — R351 Nocturia: Secondary | ICD-10-CM

## 2017-06-25 DIAGNOSIS — D72829 Elevated white blood cell count, unspecified: Secondary | ICD-10-CM

## 2017-06-25 DIAGNOSIS — E785 Hyperlipidemia, unspecified: Secondary | ICD-10-CM | POA: Diagnosis not present

## 2017-06-25 DIAGNOSIS — J438 Other emphysema: Secondary | ICD-10-CM

## 2017-06-25 LAB — URINALYSIS, ROUTINE W REFLEX MICROSCOPIC
Bilirubin Urine: NEGATIVE
KETONES UR: NEGATIVE
LEUKOCYTES UA: NEGATIVE
NITRITE: NEGATIVE
Specific Gravity, Urine: 1.01 (ref 1.000–1.030)
Total Protein, Urine: NEGATIVE
URINE GLUCOSE: NEGATIVE
UROBILINOGEN UA: 0.2 (ref 0.0–1.0)
pH: 6 (ref 5.0–8.0)

## 2017-06-25 LAB — CBC WITH DIFFERENTIAL/PLATELET
Basophils Absolute: 0.1 10*3/uL (ref 0.0–0.1)
Basophils Relative: 0.7 % (ref 0.0–3.0)
EOS PCT: 1.6 % (ref 0.0–5.0)
Eosinophils Absolute: 0.2 10*3/uL (ref 0.0–0.7)
HEMATOCRIT: 44.7 % (ref 39.0–52.0)
Hemoglobin: 15 g/dL (ref 13.0–17.0)
LYMPHS ABS: 1.4 10*3/uL (ref 0.7–4.0)
Lymphocytes Relative: 12.3 % (ref 12.0–46.0)
MCHC: 33.4 g/dL (ref 30.0–36.0)
MCV: 98.7 fl (ref 78.0–100.0)
MONOS PCT: 11.8 % (ref 3.0–12.0)
Monocytes Absolute: 1.4 10*3/uL — ABNORMAL HIGH (ref 0.1–1.0)
NEUTROS ABS: 8.7 10*3/uL — AB (ref 1.4–7.7)
NEUTROS PCT: 73.6 % (ref 43.0–77.0)
PLATELETS: 319 10*3/uL (ref 150.0–400.0)
RBC: 4.53 Mil/uL (ref 4.22–5.81)
RDW: 13 % (ref 11.5–15.5)
WBC: 11.8 10*3/uL — ABNORMAL HIGH (ref 4.0–10.5)

## 2017-06-25 LAB — COMPREHENSIVE METABOLIC PANEL
ALT: 16 U/L (ref 0–53)
AST: 20 U/L (ref 0–37)
Albumin: 4.3 g/dL (ref 3.5–5.2)
Alkaline Phosphatase: 67 U/L (ref 39–117)
BUN: 11 mg/dL (ref 6–23)
CALCIUM: 9.2 mg/dL (ref 8.4–10.5)
CHLORIDE: 87 meq/L — AB (ref 96–112)
CO2: 32 meq/L (ref 19–32)
Creatinine, Ser: 0.89 mg/dL (ref 0.40–1.50)
GFR: 89.88 mL/min (ref >60.00)
Glucose, Bld: 96 mg/dL (ref 70–99)
Potassium: 4.3 mEq/L (ref 3.5–5.1)
Sodium: 129 mEq/L — ABNORMAL LOW (ref 135–145)
Total Bilirubin: 0.9 mg/dL (ref 0.2–1.2)
Total Protein: 7.1 g/dL (ref 6.0–8.3)

## 2017-06-25 LAB — PSA: PSA: 1.02 ng/mL (ref 0.10–4.00)

## 2017-06-25 NOTE — Assessment & Plan Note (Signed)
S: patient notes his urinary stream has weakened. Peeing about once a night Lab Results  Component Value Date   PSA 1.17 06/05/2016   PSA 0.71 02/25/2014   PSA 2.42 02/09/2013  A/P: update PSA trend- discussed likely BPH. Somewhat hesitant to do PSA with other comorbid condition but he would like to have this information- we will proceed forward.

## 2017-06-25 NOTE — Assessment & Plan Note (Signed)
Update CBC and get smear- suspect wbc could be slightly high from his ongoing respiratory issues and prednisone use but just want to monitor. Repeat every 6 months

## 2017-06-25 NOTE — Assessment & Plan Note (Signed)
S:  continues to smoke pipe 2x a day- have advised complete cessation. Is compliant with spiriva and symbicort as well as singulair and chronic prednisone. He has a plan for flares with pulmonary including prednisone taper. Also takes azithromycin MWF. Follows with Dr. Lamonte Sakai  Has had a few tough days- he thinks he may have to restart 4,3,2, 1 prednisone which would be 2nd time this month- he is also considering seeing Dr. Lamonte Sakai soon A/P: continue current medication. Strongly advised smoking cessation- he declines

## 2017-06-25 NOTE — Assessment & Plan Note (Signed)
S:  controlled on last check with LDL 75 without medication as he had improved diet. Prior over 121 on calculated #s. No myalgias.   A/P: update lipid panel at [facility] 6 months

## 2017-06-25 NOTE — Progress Notes (Signed)
Subjective:  Francisco [name] is a 71 y.o. year old very pleasant male patient who presents for/with See problem oriented charting ROS- somewhat increased shortness of breath and cough. Stable edema. No chest pain   Past Medical History-  Patient Active Problem List   Diagnosis Date Noted  . TOBACCO USE 08/23/2008    Priority: High  . COPD (chronic obstructive pulmonary disease) with emphysema ([place]) 08/23/2008    Priority: High  . Hyperglycemia 06/12/2016    Priority: Medium  . Hyperlipidemia 07/22/2014    Priority: Medium  . Onychomycosis 07/22/2014    Priority: Low  . Left ankle swelling 07/22/2014    Priority: Low  . History of skin cancer 05/17/2014    Priority: Low  . Chronic rhinitis 10/28/2012    Priority: Low  . Pulmonary nodule 09/23/2011    Priority: Low  . History of colonic polyps 08/23/2008    Priority: Low  . BPH associated with nocturia 06/25/2017  . Leukocytosis 12/19/2016    Medications- reviewed and updated Current Outpatient Medications  Medication Sig Dispense Refill  . azithromycin (ZITHROMAX) 250 MG tablet TAKE 1 TABLET ON MONDAY,  WEDNESDAY, AND FRIDAY 39 tablet 1  . fluticasone (FLONASE) 50 MCG/ACT nasal spray USE 2 SPRAYS IN EACH  NOSTRIL DAILY 48 g 1  . montelukast (SINGULAIR) 10 MG tablet TAKE 1 TABLET BY MOUTH AT  BEDTIME 90 tablet 2  . predniSONE (DELTASONE) 10 MG tablet TAKE 1 TABLET BY MOUTH  DAILY WITH BREAKFAST 90 tablet 1  . predniSONE (DELTASONE) 10 MG tablet TAKE 1 TABLET BY MOUTH  DAILY WITH BREAKFAST 90 tablet 1  . SPIRIVA RESPIMAT 2.5 MCG/ACT AERS USE 2 INHALATIONS INTO THE  LUNGS DAILY 12 g 5  . SYMBICORT 160-4.5 MCG/ACT inhaler USE 2 PUFFS BY MOUTH TWO  TIMES DAILY 3 Inhaler 1  . terbinafine (LAMISIL) 250 MG tablet Take 250 mg by mouth See admin instructions. Take 1 tablet daily for 7 days every month    . triamterene-hydrochlorothiazide (MAXZIDE-25) 37.5-25 MG tablet TAKE 1 TABLET BY MOUTH  DAILY AS NEEDED. USE  SPARINGLY 90 tablet  1  . VENTOLIN HFA 108 (90 Base) MCG/ACT inhaler USE 2 PUFFS EVERY 6 HOURS  AS NEEDED FOR WHEEZING 3 Inhaler 1   Objective: BP 128/78 (BP Location: Left Arm, Patient Position: Sitting, Cuff Size: Large)   Pulse (!) 111   Temp (!) 97.3 F (36.3 C) (Oral)   Ht 6' (1.829 m)   Wt 148 lb 6.4 oz (67.3 kg)   SpO2 94%   BMI 20.13 kg/m  Gen: NAD, resting comfortably CV: tachycardic per baseline no murmurs rubs or gallops Lungs: no crackles, rhonchi. Wheeze noted. He declines breathing treatment Abdomen: soft/nontender/nondistended/normal bowel sounds.  Ext: 2+ edema bilateral under compression stockings. Legs appear more equal though usually L>R. Right lower inner leg- 5 x 5 mm indentation in area of prior ulcer.  Skin: warm, dry  Assessment/Plan:  Hyperlipidemia S:  controlled on last check with LDL 75 without medication as he had improved diet. Prior over 121 on calculated #s. No myalgias.   A/P: update lipid panel at [facility] 6 months  COPD (chronic obstructive pulmonary disease) with emphysema S:  continues to smoke pipe 2x a day- have advised complete cessation. Is compliant with spiriva and symbicort as well as singulair and chronic prednisone. He has a plan for flares with pulmonary including prednisone taper. Also takes azithromycin MWF. Follows with Dr. Lamonte Sakai  Has had a few tough days- he  thinks he may have to restart 4,3,2, 1 prednisone which would be 2nd time this month- he is also considering seeing Dr. Lamonte Sakai soon A/P: continue current medication. Strongly advised smoking cessation- he declines  BPH associated with nocturia S: patient notes his urinary stream has weakened. Peeing about once a night Lab Results  Component Value Date   PSA 1.17 06/05/2016   PSA 0.71 02/25/2014   PSA 2.42 02/09/2013  A/P: update PSA trend- discussed likely BPH. Somewhat hesitant to do PSA with other comorbid condition but he would like to have this information- we will proceed forward.   Left ankle  swelling Last visit noted venous stasis ulcer noted. Advised to wear his 94mmhg compression stockings ince he could not tolerate higher. We had discussed labs or update echocardiogram. Was using his triamterene- hctz daily- usually just does prn  He did mainly daily triamterine hctz and wore compression. Area on leg at least stopped oozing- if forgets lotion may reopen- is still about 5 x 5 mm in size but not open at this point.   Leukocytosis Update CBC and get smear- suspect wbc could be slightly high from his ongoing respiratory issues and prednisone use but just want to monitor. Repeat every 6 months  Future Appointments  Date Time Provider [place]  07/17/2017  9:00 AM Collene Gobble, MD [facility] None  12/25/2017  8:30 AM [name], MD [facility]   Return in about 6 months (around 12/24/2017) for physical.  Orders Placed This Encounter  Procedures  . Urinalysis    Standing Status:   Future    Number of Occurrences:   1    Standing Expiration Date:   06/25/2018  . CBC with Differential/Platelet  . Comprehensive metabolic panel    [place]  . Pathologist smear review  . PSA   Return precautions advised.  Garret Reddish, MD

## 2017-06-25 NOTE — Patient Instructions (Signed)
Please stop by lab before you go  Can go back to the triamterene- hctz every other day as long as not causing hand cramping. Glad it helped with the swelling- great job wearing your compression stockings

## 2017-06-25 NOTE — Assessment & Plan Note (Signed)
Last visit noted venous stasis ulcer noted. Advised to wear his 10mmhg compression stockings ince he could not tolerate higher. We had discussed labs or update echocardiogram. Was using his triamterene- hctz daily- usually just does prn  He did mainly daily triamterine hctz and wore compression. Area on leg at least stopped oozing- if forgets lotion may reopen- is still about 5 x 5 mm in size but not open at this point.

## 2017-06-26 ENCOUNTER — Encounter: Payer: Self-pay | Admitting: Family Medicine

## 2017-06-26 ENCOUNTER — Other Ambulatory Visit: Payer: Self-pay

## 2017-06-26 DIAGNOSIS — R319 Hematuria, unspecified: Secondary | ICD-10-CM

## 2017-06-26 DIAGNOSIS — E871 Hypo-osmolality and hyponatremia: Secondary | ICD-10-CM

## 2017-06-26 LAB — PATHOLOGIST SMEAR REVIEW

## 2017-07-02 ENCOUNTER — Other Ambulatory Visit: Payer: Self-pay | Admitting: Emergency Medicine

## 2017-07-16 ENCOUNTER — Ambulatory Visit: Payer: Self-pay | Admitting: *Deleted

## 2017-07-16 NOTE — Telephone Encounter (Signed)
Patient called with concerns about OTC medication use and upcoming lab appointment.  Answer Assessment - Initial Assessment Questions 1. SYMPTOMS: "Do you have any symptoms?"     Back pain- patient is using Advil -OTC- 6/day since last Monday. Francisco [name] is afraid that it may have an effect on the labs scheduled for this Friday. Francisco [name] would like to reschedule them for after Francisco [name] sees his pulmonologist tomorrow. (Francisco [name] sees Francisco [name]) 2. SEVERITY: If symptoms are present, ask "Are they mild, moderate or severe?"     Francisco [name] is not sure if it is lung related or muscular Francisco [name] will get it assessed tomorrow and call to reschedule labs after appointment.  Patient is concerned that because Francisco [name] was showing blood in his urine on previous test and Francisco [name] has been using NSAID that can be GI irritant - Francisco [name] would like to wait a week after Francisco [name] stops the NSAID for testing. Francisco [name] states Francisco [name] will call to reschedule his labs. Told patient I would make his provider aware.  Protocols used: MEDICATION QUESTION CALL-A-AH

## 2017-07-16 NOTE — Telephone Encounter (Signed)
Yes that's fine he can push his labs back

## 2017-07-17 ENCOUNTER — Ambulatory Visit (INDEPENDENT_AMBULATORY_CARE_PROVIDER_SITE_OTHER): Payer: Medicare Other | Admitting: Emergency Medicine

## 2017-07-17 ENCOUNTER — Encounter: Payer: Self-pay | Admitting: Emergency Medicine

## 2017-07-17 ENCOUNTER — Ambulatory Visit (INDEPENDENT_AMBULATORY_CARE_PROVIDER_SITE_OTHER)
Admission: RE | Admit: 2017-07-17 | Discharge: 2017-07-17 | Disposition: A | Discharge status: Home / Self Care | Payer: Medicare Other | Source: Ambulatory Visit | Attending: Emergency Medicine | Admitting: Emergency Medicine

## 2017-07-17 VITALS — BP 130/82 | HR 102 | Ht 72.0 in | Wt 152.2 lb

## 2017-07-17 DIAGNOSIS — R0781 Pleurodynia: Secondary | ICD-10-CM

## 2017-07-17 DIAGNOSIS — F17200 Nicotine dependence, unspecified, uncomplicated: Secondary | ICD-10-CM | POA: Diagnosis not present

## 2017-07-17 DIAGNOSIS — J438 Other emphysema: Secondary | ICD-10-CM | POA: Diagnosis not present

## 2017-07-17 DIAGNOSIS — J310 Chronic rhinitis: Secondary | ICD-10-CM | POA: Diagnosis not present

## 2017-07-17 IMAGING — CT CT ANGIO CHEST
2 of 7 series · 18 of 46 positions shown · IV contrast (ISOVUE 370)
Comparison: Chest CT [DATE] and chest radiograph [facility]

CLINICAL DATA: Chest pain and shortness of breath

EXAM:
CT ANGIOGRAPHY CHEST WITH CONTRAST
TECHNIQUE: Multidetector CT imaging of the chest was performed using the
standard protocol during bolus administration of intravenous
contrast. Multiplanar CT image reconstructions and MIPs were
obtained to evaluate the vascular anatomy.
CONTRAST:  80mL [N4] IOPAMIDOL ([N4]) INJECTION 76%

[Series 5: thins · axial · 0.65mm/px · z∈[-374,-52]mm · 15 of 355 slices shown]
[im 16/355  lung]
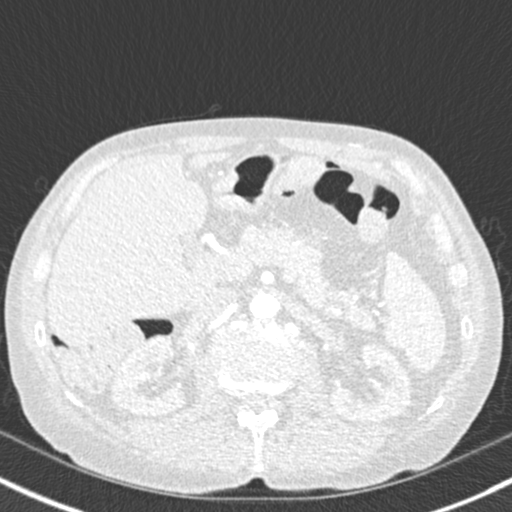
[im 47/355  soft-tissue]
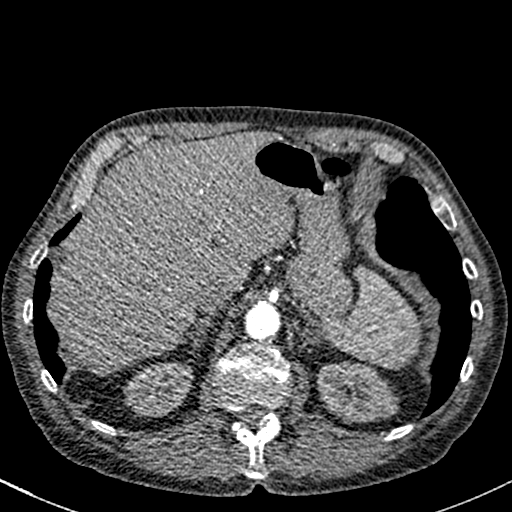
[im 62/355  lung]
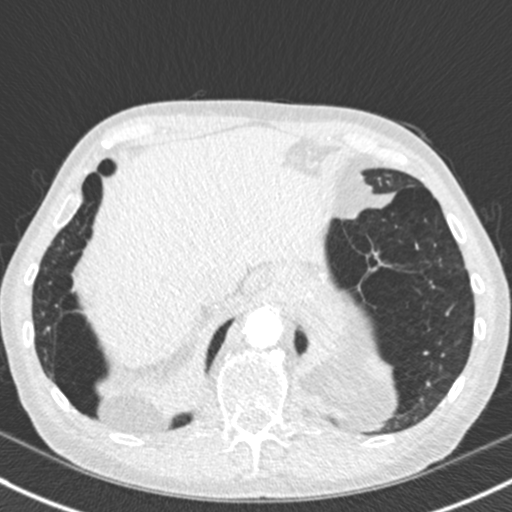
[im 93/355  soft-tissue]
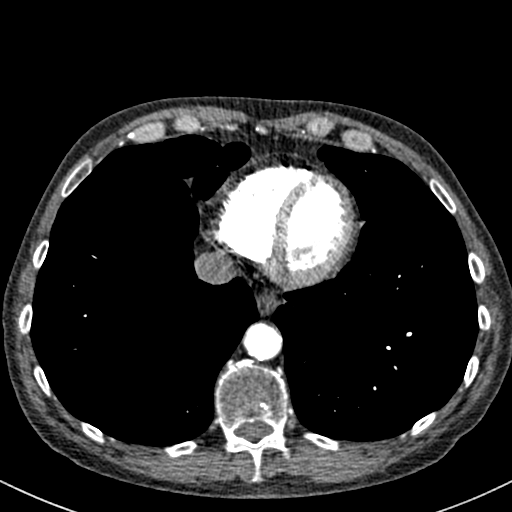
[im 108/355  lung]
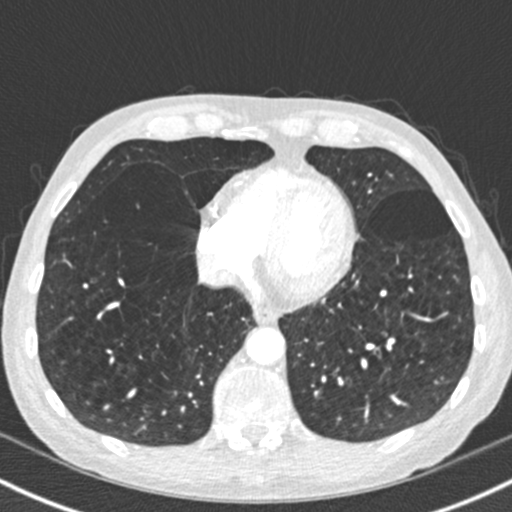
[im 139/355  soft-tissue]
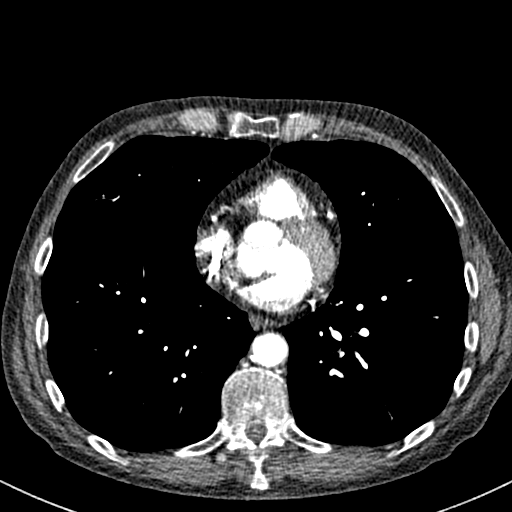
[im 154/355  lung]
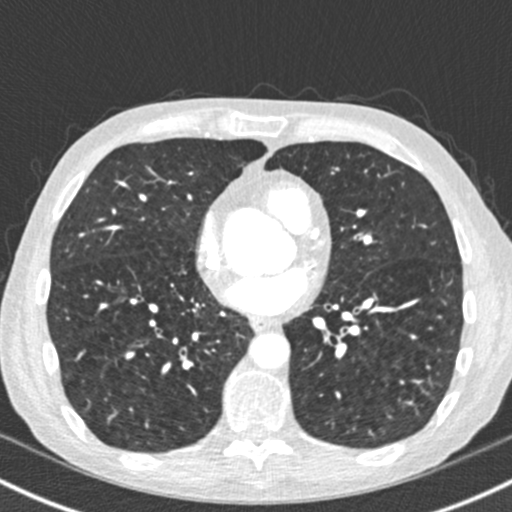
[im 185/355  soft-tissue]
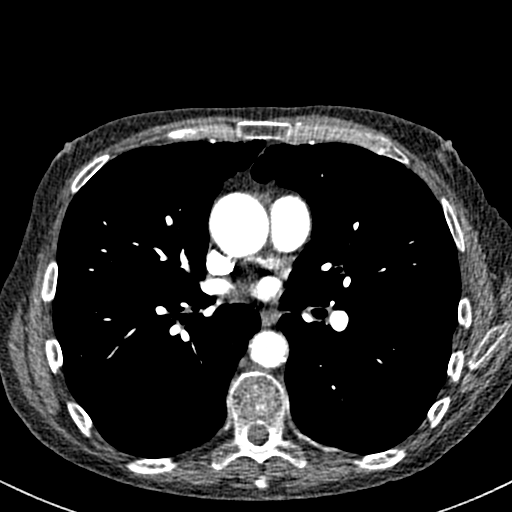
[im 201/355  lung]
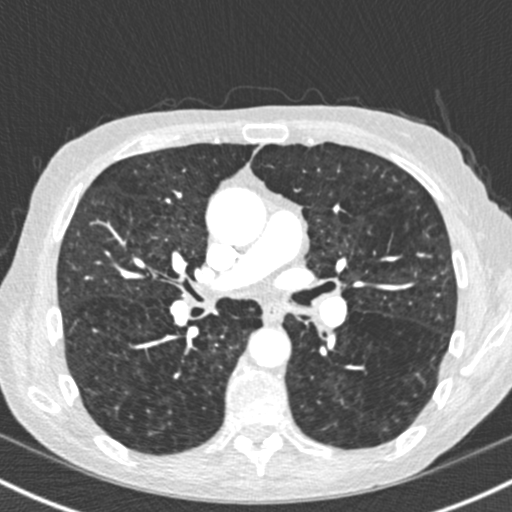
[im 216/355  soft-tissue]
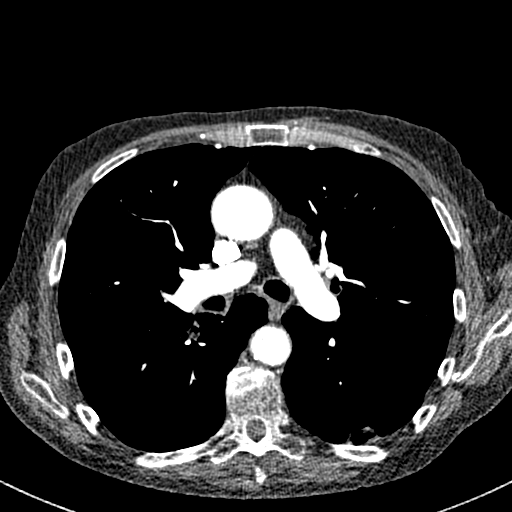
[im 247/355  lung]
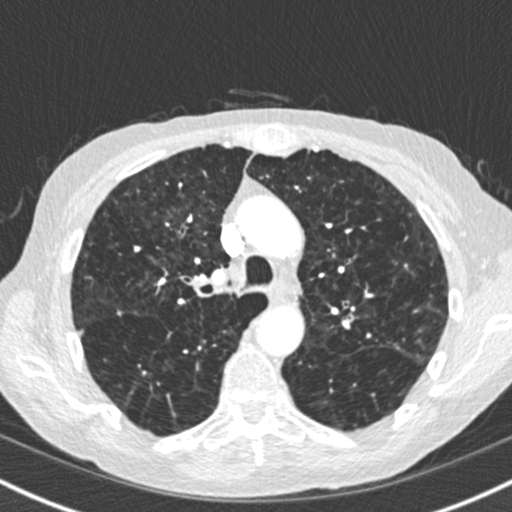
[im 262/355  soft-tissue]
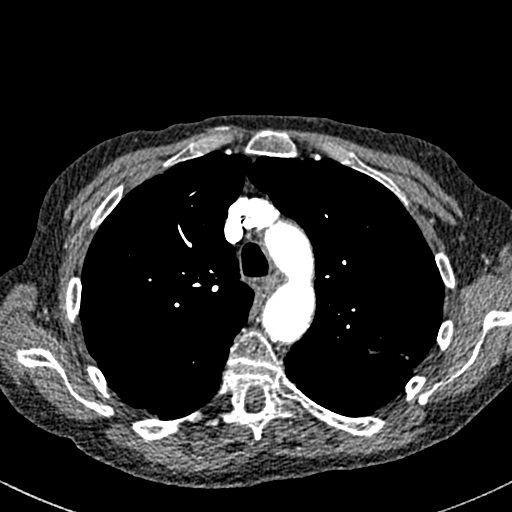
[im 293/355  lung]
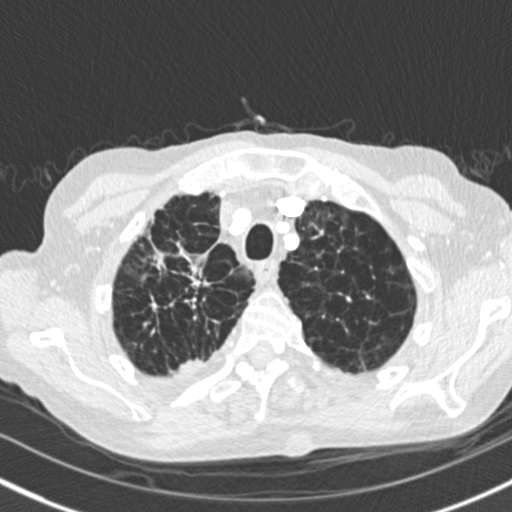
[im 308/355  soft-tissue]
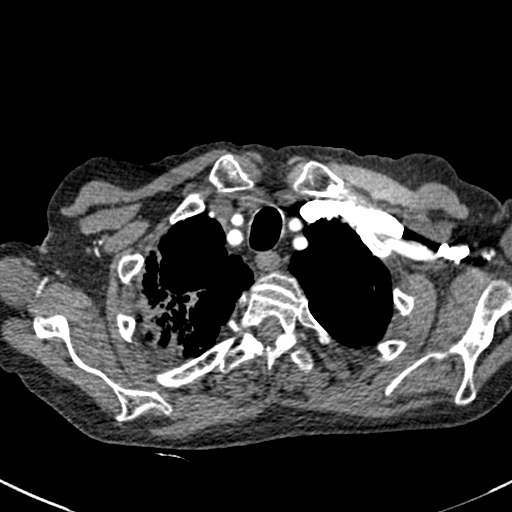
[im 339/355  lung]
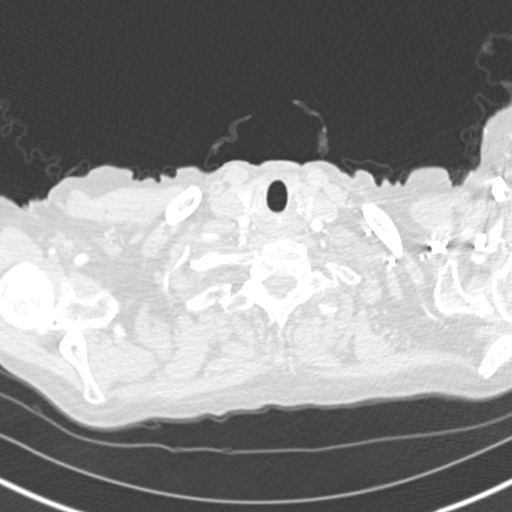

[Series 7: coronal mpr · coronal · 0.61mm/px · 3 of 126 slices shown]
[im 32/126  soft-tissue]
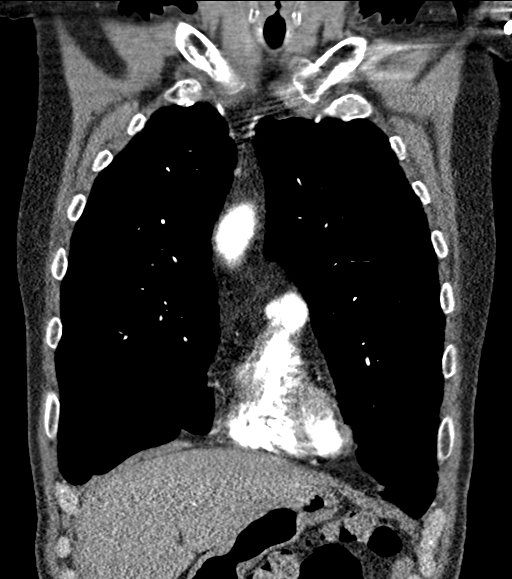
[im 63/126  soft-tissue]
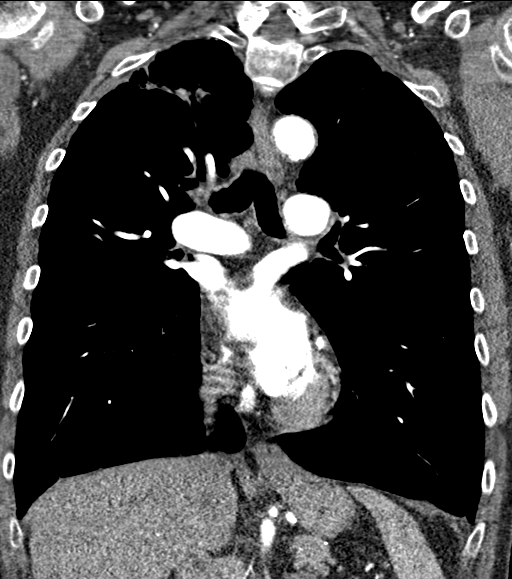
[im 94/126  soft-tissue]
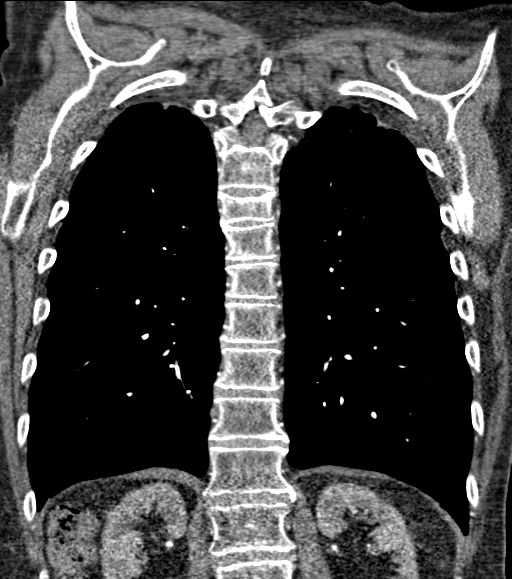

[18 of 46 positions shown; findings below may reference images not displayed]

FINDINGS: Cardiovascular: There is no demonstrable pulmonary embolus. There is
no thoracic aortic aneurysm or dissection. There is moderate
atherosclerotic calcification at the origins of the visualized great
vessels. There are foci of atherosclerotic calcification in the
aorta. There are multiple foci of coronary artery calcification.
There is no appreciable pericardial effusion or pericardial
thickening.

Mediastinum/Nodes: Thyroid appears unremarkable. There is no
appreciable thoracic adenopathy. No esophageal lesions are
appreciable.

Lungs/Pleura: There is extensive centrilobular and paraseptal
emphysematous change. There is cicatrization in the right apex
region, stable. A much lesser degree of scarring is noted in the
left apex region. There is no appreciable edema or consolidation. No
pleural effusion or pleural thickening evident. There is no new
parenchymal irregularity. No parenchymal lung mass demonstrable on
this study.

Upper Abdomen: There is atherosclerotic calcification and plaque in
the upper abdominal aorta. Visualized upper abdominal structures
otherwise unremarkable.

Musculoskeletal: There is degenerative change in the thoracic spine.
There is anterior wedging of the T7 vertebral body. There is
calcification in the thoracic disc at T11-12. There are no blastic
or lytic bone lesions.

Review of the MIP images confirms the above findings.
IMPRESSION: 1.  No demonstrable pulmonary embolus.

2. Extensive emphysematous change. No edema or consolidation. Stable
upper lobe cicatrization, considerably more on the right than on the
left.

3.  No appreciable thoracic adenopathy.

4. Aortic and great vessel atherosclerotic change. Multiple foci of
coronary artery calcification evident.

Aortic Atherosclerosis ([N4]-[N4]) and Emphysema ([N4]-[N4]).

## 2017-07-17 MED ORDER — IOPAMIDOL (ISOVUE-370) INJECTION 76%
80.0000 mL | Freq: Once | INTRAVENOUS | Status: AC | PRN
  Administered 2017-07-17: 80 mL via INTRAVENOUS

## 2017-07-17 NOTE — Assessment & Plan Note (Signed)
Recent onset pleuritic left back pain.  He is at risk for pulmonary embolism.  He has left greater than right lower extremity edema.  I believe he needs a CT chest to fully evaluate.

## 2017-07-17 NOTE — Assessment & Plan Note (Signed)
Needs cessation

## 2017-07-17 NOTE — Assessment & Plan Note (Signed)
Continue Singulair.

## 2017-07-17 NOTE — Progress Notes (Signed)
Subjective:    Patient ID: Francisco [name], male    DOB: 1946-04-10, 72 y.o.   MRN: [id number]  HPI 72 year old man, active smoker (a pipe about 2x a day), who has been followed by Dr [name] for severe COPD. Personal and reviewed his prior poor function testing from 10/02/09 that shows severe obstruction with an FEV1 of 1.2 L or 34% predicted and a positive bronchodilator response. He's been managed on maximal bronchodilator therapy, prednisone 10 mg daily chronically, and azithromycin 3 days a week.   He states that he is doing "ok" but he knows that his breathing is slowly worsening, now cannot walk a short distance without dyspnea. He is feeling the need to nap. He had walking oximetry within the last year. He has sinus congestion - is on singulair, flonase. He occasionally uses bursts of his prednisone when sx dictate. He averages flares about once a month. Much less active now, retired in the last year.   ROV 09/11/15 -- follow-up visit for COPD. He's been managed on chronic prednisone 10 mg daily as well as Spiriva and Symbicort. At her last visit we continue these medications and we discussed smoking cessation. He has LLE edema (doppler negative in the past). He is on Maxide, presrcibed by Dr [name].  He remains on Maxide. He is having progressive worsening in exertional SOB, happens when he walks, takes a shower. He doesn't exercise much. He increases his pred as needed, has done some short tapers about every month. Smoking about a bowl full a day.   ROV 01/30/16 -- patient with a history of tobacco use, COPD. He's been on chronic prednisone, Symbicort, Spiriva. He feels that he is a bit more limited over the last several months. Especially the last week when it has been hot and humid. He increases his prednisone based on how he is feeling - averages a short taper about once a month. Helps w his exertional SOB. He has some cough daily that improves also with pred. He has intermittent wheeze. No abx  since last time. His sinus drainage is under good control. He is no longer taking the maxide every day, has changed to prn based on his leg swelling.   ROV 06/25/16 -- follow up visit for tobacco, COPD maintained on chronic pred, symbicort, spiriva. He continues to note progressive DOE, probably w less exertion than prior visit. He up-titrates his pred about once a month. He is using albuterol about 6 puffs a day.  He uses more pred when he gets more mucous. He smokes a pipe, no plans to stop.     ROV 12/30/16 -- patient has a history of tobacco use, COPD currently maintained on chronic prednisone, Symbicort, Spiriva. Most recent blurry function testing was March 2011 with an FEV1 of 1.2 L (34% Pred), severe obstruction. He up titrates his prednisone on his own based on symptoms. No abx since last time. He has increased pred temporrily about 4 -5 x since last time. No increases for over a month. He is smoking a pipe, not cigarettes. He is using HCTZ more frequently these days. He is on flonase and singulair. He does note that he has some slow progression of his DOE. He coughs a few times a day, clears clear sputum.   rov 07/17/17 --this is a follow-up visit for patient with active tobacco use, severe COPD and chronic prednisone use.  He often titrates his prednisone on his own depending on how he is feeling. He complains  of a new pain in his back below his left shoulder blade, has been present for the last 10 days. sometimes pleuritic, can be stabbing, worse w cough. His pred use: on 10mg  for the last 19 days. No hx VTE. He has LE edema, venous stasis changes, increased since his diuretics decreased.    No flowsheet data found.   Review of Systems As per HPI     Objective:   Physical Exam Vitals:   07/17/17 0856  BP: 130/82  Pulse: (!) 102  SpO2: 95%  Weight: 152 lb 3.2 oz (69 kg)  Height: 6' (1.829 m)   Gen: Pleasant, Thin, in no distress,  normal affect  ENT: No lesions,  mouth clear,   oropharynx clear, no postnasal drip  Neck: No JVD, no stridor  Lungs: No use of accessory muscles, bilateral diffuse expiratory wheezes  Cardiovascular: RRR, heart sounds normal, no murmur or gallops, 2-3+ LE edema  Musculoskeletal: No deformities, no cyanosis or clubbing  Neuro: alert, non focal  Skin: Warm, area of bullous bruising on L lateral shin     Assessment & Plan:  Pleuritic pain Recent onset pleuritic left back pain.  He is at risk for pulmonary embolism.  He has left greater than right lower extremity edema.  I believe he needs a CT chest to fully evaluate.  TOBACCO USE Needs cessation  COPD (chronic obstructive pulmonary disease) with emphysema Reviewed his prednisone use with him today.  He is typically on 10 mg daily, averages 1-2 brief tapers a month.  Based on his current wheezing he probably needs to undertake a taper at this time.  He is aware and will do so.  Chronic rhinitis Continue Singulair   [name], MD, PhD 07/17/2017, 9:34 AM [facility] [phone] or if no answer [phone]

## 2017-07-17 NOTE — Patient Instructions (Addendum)
We will arrange for CT scan of your chest Please continue your inhaled medications and your prednisone as you have been taking them Follow with Dr Lamonte Sakai in 1 month

## 2017-07-17 NOTE — Assessment & Plan Note (Signed)
Reviewed his prednisone use with him today.  He is typically on 10 mg daily, averages 1-2 brief tapers a month.  Based on his current wheezing he probably needs to undertake a taper at this time.  He is aware and will do so.

## 2017-07-18 ENCOUNTER — Other Ambulatory Visit: Payer: Medicare Other

## 2017-07-18 ENCOUNTER — Telehealth: Payer: Self-pay | Admitting: Emergency Medicine

## 2017-07-18 NOTE — Telephone Encounter (Signed)
Spoke to pt, told him Dr.Hunter said it is fine to push labs back. Pt verbalized understanding and will call to schedule.

## 2017-07-18 NOTE — Telephone Encounter (Signed)
Spoke with patient. He is requesting his CT scan results from yesterday. He also wants to know what to do about the pain in his back.   RB, please advise on results. They have been loaded into his chart. Thanks!

## 2017-07-18 NOTE — Telephone Encounter (Signed)
Let him know that there is no evidence of blood clot or any other dangerous finding on a CT scan of the chest.  He can use ibuprofen or Tylenol for his back pain.  Ibuprofen would probably be more effective

## 2017-07-18 NOTE — Telephone Encounter (Signed)
Advised pt of results. Pt understood and nothing further is needed.   

## 2017-07-30 ENCOUNTER — Other Ambulatory Visit: Payer: Self-pay | Admitting: Emergency Medicine

## 2017-07-30 ENCOUNTER — Telehealth: Payer: Self-pay | Admitting: Emergency Medicine

## 2017-07-30 MED ORDER — ALBUTEROL SULFATE HFA 108 (90 BASE) MCG/ACT IN AERS: INHALATION_SPRAY | RESPIRATORY_TRACT | 3 refills | Status: DC

## 2017-07-30 NOTE — Telephone Encounter (Signed)
Spoke with patient. He was requesting a refill on his Ventolin inhaler to be sent to [place]. Advised patient that I would send a refill for him. He verbalized understanding. Nothing else needed at time of call.

## 2017-08-07 ENCOUNTER — Encounter: Payer: Self-pay | Admitting: Emergency Medicine

## 2017-09-01 ENCOUNTER — Ambulatory Visit: Payer: Medicare Other | Admitting: Emergency Medicine

## 2017-09-23 ENCOUNTER — Ambulatory Visit (INDEPENDENT_AMBULATORY_CARE_PROVIDER_SITE_OTHER): Payer: Medicare Other | Admitting: Emergency Medicine

## 2017-09-23 ENCOUNTER — Telehealth: Payer: Self-pay | Admitting: Emergency Medicine

## 2017-09-23 ENCOUNTER — Encounter: Payer: Self-pay | Admitting: Emergency Medicine

## 2017-09-23 ENCOUNTER — Other Ambulatory Visit: Payer: Self-pay | Admitting: Internal Medicine

## 2017-09-23 ENCOUNTER — Other Ambulatory Visit: Payer: Self-pay | Admitting: Emergency Medicine

## 2017-09-23 VITALS — BP 122/82 | HR 130 | Ht 72.0 in | Wt 146.0 lb

## 2017-09-23 DIAGNOSIS — F17200 Nicotine dependence, unspecified, uncomplicated: Secondary | ICD-10-CM

## 2017-09-23 DIAGNOSIS — J9611 Chronic respiratory failure with hypoxia: Secondary | ICD-10-CM | POA: Diagnosis not present

## 2017-09-23 DIAGNOSIS — J438 Other emphysema: Secondary | ICD-10-CM

## 2017-09-23 NOTE — Telephone Encounter (Signed)
Will forward to Horace as this is a RB patient and not CY.

## 2017-09-23 NOTE — Assessment & Plan Note (Signed)
Severe emphysematous COPD.  Currently on Spiriva, Symbicort.  He titrates his prednisone depending on his symptoms.  He has not increased his prednisone from 10 mg daily for the last 17 days.  He has wheezing today and I will give him 20 mg daily for 4 days, then go back to 10 mg.  He will continue to uptitrate per his previous routine.  His flu shot and pneumonia vaccines are up-to-date.

## 2017-09-23 NOTE — Assessment & Plan Note (Signed)
Desaturation noted today after walking to the exam room.  We will perform walking titration today.  I will also order an ONO on room air to see if he needs it at night.  I will order a portable oxygen system for him.

## 2017-09-23 NOTE — Progress Notes (Signed)
Subjective:    Patient ID: Francisco [name], male    DOB: 07/04/1946, 72 y.o.   MRN: [id number]  HPI   ROV 12/30/16 -- patient has a history of tobacco use, COPD currently maintained on chronic prednisone, Symbicort, Spiriva. Most recent blurry function testing was March 2011 with an FEV1 of 1.2 L (34% Pred), severe obstruction. He up titrates his prednisone on his own based on symptoms. No abx since last time. He has increased pred temporrily about 4 -5 x since last time. No increases for over a month. He is smoking a pipe, not cigarettes. He is using HCTZ more frequently these days. He is on flonase and singulair. He does note that he has some slow progression of his DOE. He coughs a few times a day, clears clear sputum.   rov 07/17/17 --this is a follow-up visit for patient with active tobacco use, severe COPD and chronic prednisone use.  He often titrates his prednisone on his own depending on how he is feeling. He complains of a new pain in his back below his left shoulder blade, has been present for the last 10 days. sometimes pleuritic, can be stabbing, worse w cough. His pred use: on 10mg  for the last 19 days. No hx VTE. He has LE edema, venous stasis changes, increased since his diuretics decreased.   ROV 09/23/17 --72 year old man with a history of tobacco use (still smokes pipe), severe COPD and severe obstruction on spirometry.  At his last visit he had some pleuritic left back pain and I performed a CT PA that I have reviewed from 07/17/17.  There was no pulmonary embolism but extensive emphysema present, some bilateral apical scar more so on the right, stable.  He uses prednisone 10 mg daily and uptitrate depending on his day-to-day symptoms.  He is otherwise managed on Symbicort and Spiriva.  Desaturated on arrival today after ambulating to the exam room. He reports more dyspnea, has increased pred intermittently.      No flowsheet data found.   Review of Systems As per HPI       Objective:   Physical Exam Vitals:   09/23/17 0953  BP: 122/82  Pulse: (!) 130  SpO2: 92%  Weight: 146 lb (66.2 kg)  Height: 6' (1.829 m)   Gen: Pleasant, Thin, in no distress,  normal affect  ENT: No lesions,  mouth clear,  oropharynx clear, no postnasal drip  Neck: No JVD, no stridor  Lungs: No use of accessory muscles, very decreased at both apices.  Better air movement at the bases with expiratory wheezes heard more so on the right  Cardiovascular: RRR, heart sounds normal, no murmur or gallops, 1-2+ LE edema  Musculoskeletal: No deformities, no cyanosis or clubbing  Neuro: alert, non focal  Skin: Warm    CT-PA 07/17/17 --  COMPARISON:  Chest CT June 01, 2013 and chest radiograph October 12, 2013  FINDINGS: Cardiovascular: There is no demonstrable pulmonary embolus. There is no thoracic aortic aneurysm or dissection. There is moderate atherosclerotic calcification at the origins of the visualized great vessels. There are foci of atherosclerotic calcification in the aorta. There are multiple foci of coronary artery calcification. There is no appreciable pericardial effusion or pericardial thickening.  Mediastinum/Nodes: Thyroid appears unremarkable. There is no appreciable thoracic adenopathy. No esophageal lesions are appreciable.  Lungs/Pleura: There is extensive centrilobular and paraseptal emphysematous change. 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 A much lesser degree of scarring is noted in  the left apex region. There is no appreciable edema or consolidation. No pleural effusion or pleural thickening evident. There is no new parenchymal irregularity. No parenchymal lung mass demonstrable on this study.  Upper Abdomen: There is atherosclerotic calcification and plaque in the upper abdominal aorta. Visualized upper abdominal structures otherwise unremarkable.  Musculoskeletal: There is degenerative change in the thoracic  spine. There is anterior wedging of the T7 vertebral body. There is calcification in the thoracic disc at T11-12. There are no blastic or lytic bone lesions.  Review of the MIP images confirms the above findings.  IMPRESSION: 1.  No demonstrable pulmonary embolus.  2. Extensive emphysematous change. No edema or consolidation. Stable upper lobe cicatrization, considerably more on the right than on the left.  3.  No appreciable thoracic adenopathy.  4. Aortic and great vessel atherosclerotic change. Multiple foci of coronary artery calcification evident.  Aortic Atherosclerosis (ICD10-I70.0) and Emphysema (ICD10-J43.9).      Assessment & Plan:  TOBACCO USE Still occasionally smokes a pipe.  We talked about stopping completely today especially in light of the fact that we are starting oxygen.  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  Currently on Spiriva, Symbicort.  He titrates his prednisone depending on his symptoms.  He has not increased his prednisone from 10 mg daily for the last 17 days.  He has wheezing today and I will give him 20 mg daily for 4 days, then go back to 10 mg.  He will continue to uptitrate per his previous routine.  His flu shot and pneumonia vaccines are up-to-date.  Chronic respiratory failure with hypoxia (HCC) Desaturation noted today after walking to the exam room.  We will perform walking titration today.  I will also order an ONO on room air to see if he needs it at night.  I will order a portable oxygen system for him.   [name], MD, PhD 09/23/2017, 10:23 AM [facility] [phone] or if no answer [phone]

## 2017-09-23 NOTE — Telephone Encounter (Signed)
Will send to [facility] once order is signed.

## 2017-09-23 NOTE — Telephone Encounter (Signed)
Order placed with corrections. FYI PCC's

## 2017-09-23 NOTE — Patient Instructions (Signed)
Please continue your Spiriva and Symbicort as you are taking them  We will plan to start oxygen for you to use with exertion. We will perform a walking oximetry today to see how much you need to wear.  We will order an overnight oximetry on room air.  Increase prednisone to 20 mg daily for the next 4 days, then decrease back down to 10 mg daily and stay at this dose until the next time you have any flaring. Flu shot and pneumonia shots are up-to-date Follow with Dr Lamonte Sakai in 3 months or sooner if you have any problems.

## 2017-09-23 NOTE — Assessment & Plan Note (Signed)
Still occasionally smokes a pipe.  We talked about stopping completely today especially in light of the fact that we are starting oxygen.

## 2017-10-07 ENCOUNTER — Encounter: Payer: Self-pay | Admitting: Emergency Medicine

## 2017-10-07 NOTE — Telephone Encounter (Signed)
RB please advise on ONO results. I placed ONO in your look at folder.

## 2017-10-15 ENCOUNTER — Telehealth: Payer: Self-pay | Admitting: Family Medicine

## 2017-10-15 ENCOUNTER — Other Ambulatory Visit: Payer: Self-pay | Admitting: Emergency Medicine

## 2017-10-15 NOTE — Telephone Encounter (Signed)
Pt declined AWV. Do no call and ask again.

## 2017-11-02 ENCOUNTER — Emergency Department (HOSPITAL_COMMUNITY): Payer: Medicare Other

## 2017-11-02 ENCOUNTER — Encounter (HOSPITAL_COMMUNITY): Payer: Self-pay

## 2017-11-02 ENCOUNTER — Observation Stay (HOSPITAL_COMMUNITY)
Admission: EM | Admit: 2017-11-02 | Discharge: 2017-11-05 | Disposition: A | Discharge status: Home / Self Care | Payer: Medicare Other | Attending: Internal Medicine | Admitting: Internal Medicine

## 2017-11-02 DIAGNOSIS — Y929 Unspecified place or not applicable: Secondary | ICD-10-CM | POA: Diagnosis not present

## 2017-11-02 DIAGNOSIS — Z85828 Personal history of other malignant neoplasm of skin: Secondary | ICD-10-CM | POA: Insufficient documentation

## 2017-11-02 DIAGNOSIS — W010XXA Fall on same level from slipping, tripping and stumbling without subsequent striking against object, initial encounter: Secondary | ICD-10-CM | POA: Insufficient documentation

## 2017-11-02 DIAGNOSIS — F17210 Nicotine dependence, cigarettes, uncomplicated: Secondary | ICD-10-CM | POA: Insufficient documentation

## 2017-11-02 DIAGNOSIS — I872 Venous insufficiency (chronic) (peripheral): Secondary | ICD-10-CM | POA: Diagnosis not present

## 2017-11-02 DIAGNOSIS — S59912A Unspecified injury of left forearm, initial encounter: Secondary | ICD-10-CM | POA: Diagnosis present

## 2017-11-02 DIAGNOSIS — R600 Localized edema: Secondary | ICD-10-CM | POA: Diagnosis not present

## 2017-11-02 DIAGNOSIS — S22000A Wedge compression fracture of unspecified thoracic vertebra, initial encounter for closed fracture: Secondary | ICD-10-CM | POA: Diagnosis not present

## 2017-11-02 DIAGNOSIS — R55 Syncope and collapse: Secondary | ICD-10-CM | POA: Diagnosis not present

## 2017-11-02 DIAGNOSIS — J439 Emphysema, unspecified: Secondary | ICD-10-CM | POA: Insufficient documentation

## 2017-11-02 DIAGNOSIS — J9611 Chronic respiratory failure with hypoxia: Secondary | ICD-10-CM | POA: Diagnosis present

## 2017-11-02 DIAGNOSIS — Z79899 Other long term (current) drug therapy: Secondary | ICD-10-CM | POA: Diagnosis not present

## 2017-11-02 DIAGNOSIS — S51812A Laceration without foreign body of left forearm, initial encounter: Secondary | ICD-10-CM | POA: Diagnosis not present

## 2017-11-02 DIAGNOSIS — Y9389 Activity, other specified: Secondary | ICD-10-CM | POA: Diagnosis not present

## 2017-11-02 DIAGNOSIS — J438 Other emphysema: Secondary | ICD-10-CM | POA: Diagnosis not present

## 2017-11-02 DIAGNOSIS — Y998 Other external cause status: Secondary | ICD-10-CM | POA: Insufficient documentation

## 2017-11-02 DIAGNOSIS — S51811A Laceration without foreign body of right forearm, initial encounter: Secondary | ICD-10-CM | POA: Diagnosis not present

## 2017-11-02 DIAGNOSIS — T148XXA Other injury of unspecified body region, initial encounter: Secondary | ICD-10-CM

## 2017-11-02 DIAGNOSIS — I10 Essential (primary) hypertension: Secondary | ICD-10-CM | POA: Diagnosis present

## 2017-11-02 LAB — CBC
HCT: 37.1 % — ABNORMAL LOW (ref 39.0–52.0)
Hemoglobin: 12.8 g/dL — ABNORMAL LOW (ref 13.0–17.0)
MCH: 32.2 pg (ref 26.0–34.0)
MCHC: 34.5 g/dL (ref 30.0–36.0)
MCV: 93.5 fL (ref 78.0–100.0)
Platelets: 243 10*3/uL (ref 150–400)
RBC: 3.97 MIL/uL — ABNORMAL LOW (ref 4.22–5.81)
RDW: 13.5 % (ref 11.5–15.5)
WBC: 11.3 10*3/uL — AB (ref 4.0–10.5)

## 2017-11-02 LAB — HEPATIC FUNCTION PANEL
ALT: 17 U/L (ref 17–63)
AST: 24 U/L (ref 15–41)
Albumin: 3.3 g/dL — ABNORMAL LOW (ref 3.5–5.0)
Alkaline Phosphatase: 86 U/L (ref 38–126)
BILIRUBIN DIRECT: 0.1 mg/dL (ref 0.1–0.5)
BILIRUBIN INDIRECT: 0.8 mg/dL (ref 0.3–0.9)
BILIRUBIN TOTAL: 0.9 mg/dL (ref 0.3–1.2)
Total Protein: 6.5 g/dL (ref 6.5–8.1)

## 2017-11-02 LAB — MAGNESIUM: Magnesium: 1.9 mg/dL (ref 1.7–2.4)

## 2017-11-02 LAB — LIPASE, BLOOD: Lipase: 28 U/L (ref 11–51)

## 2017-11-02 LAB — I-STAT TROPONIN, ED: Troponin i, poc: 0.01 ng/mL (ref 0.00–0.08)

## 2017-11-02 LAB — BRAIN NATRIURETIC PEPTIDE: B Natriuretic Peptide: 83.9 pg/mL (ref 0.0–100.0)

## 2017-11-02 IMAGING — DX DG CHEST 2V
2 series · 3 of 3 positions shown · non-contrast
Comparison: [DATE].  Chest CT, [DATE].

CLINICAL DATA: Pt reports 2 falls this weekend with multiple skin
tears. Pt states he doesn't know why he fell. Pt having left lower
lateral rib pain.

EXAM:
CHEST - 2 VIEW

[chest lat]
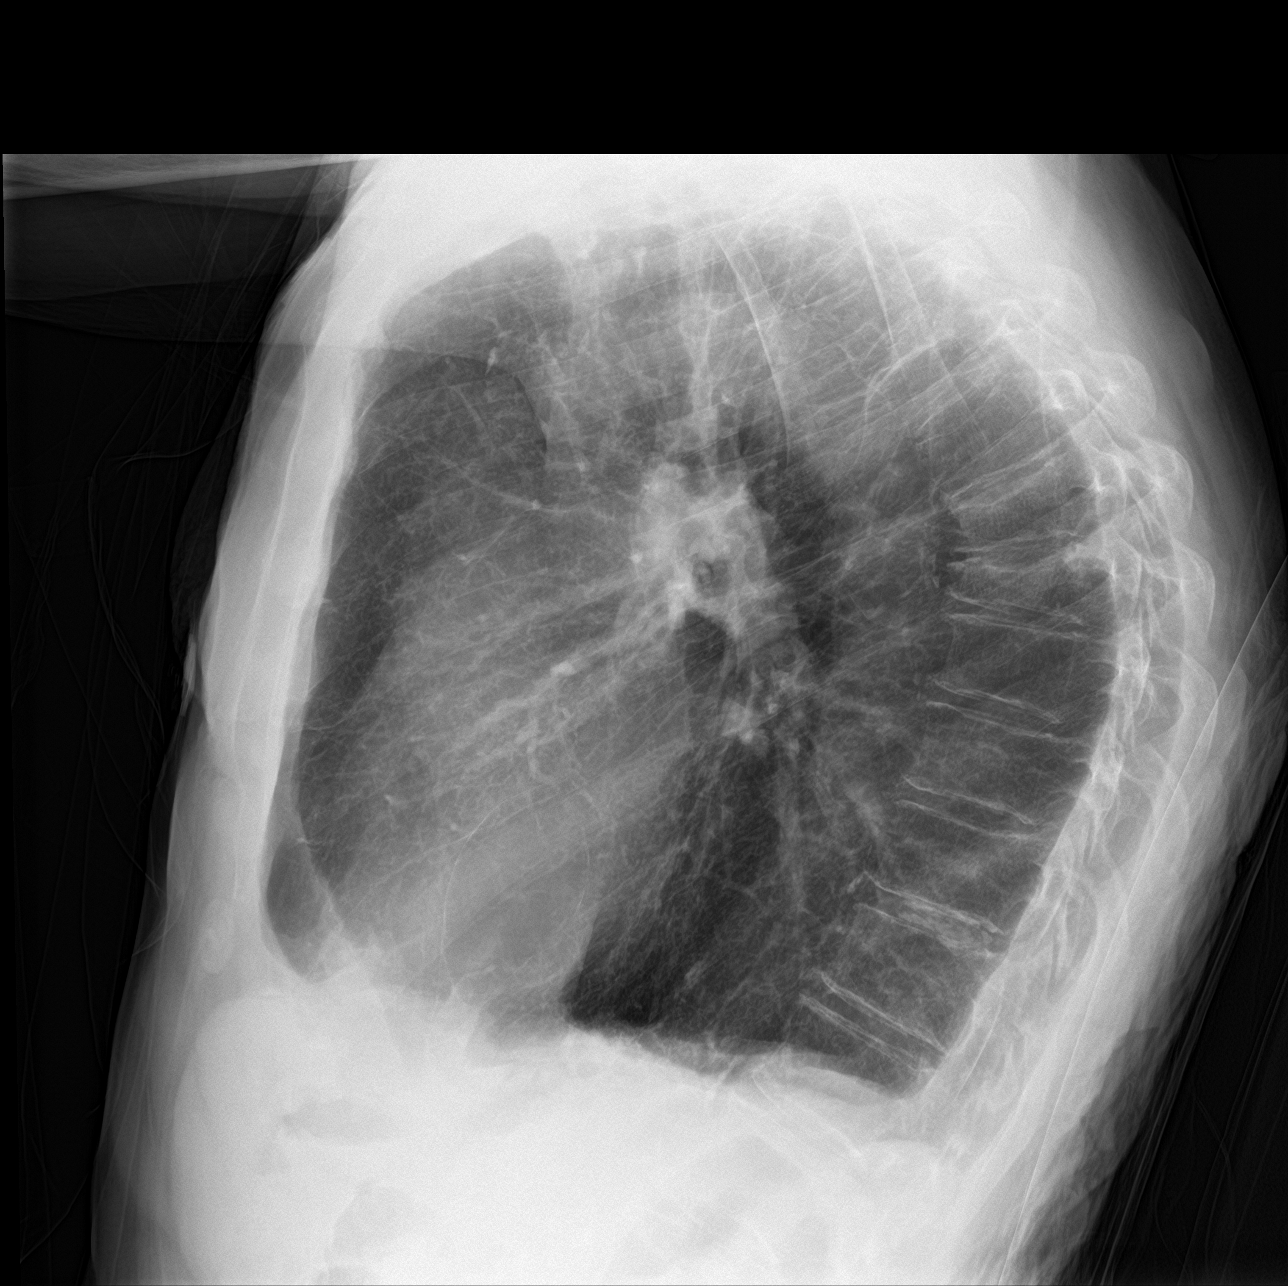

[Series 3: chest ap · 0.14mm/px · 2 of 2 slices shown]
[im 1/2]
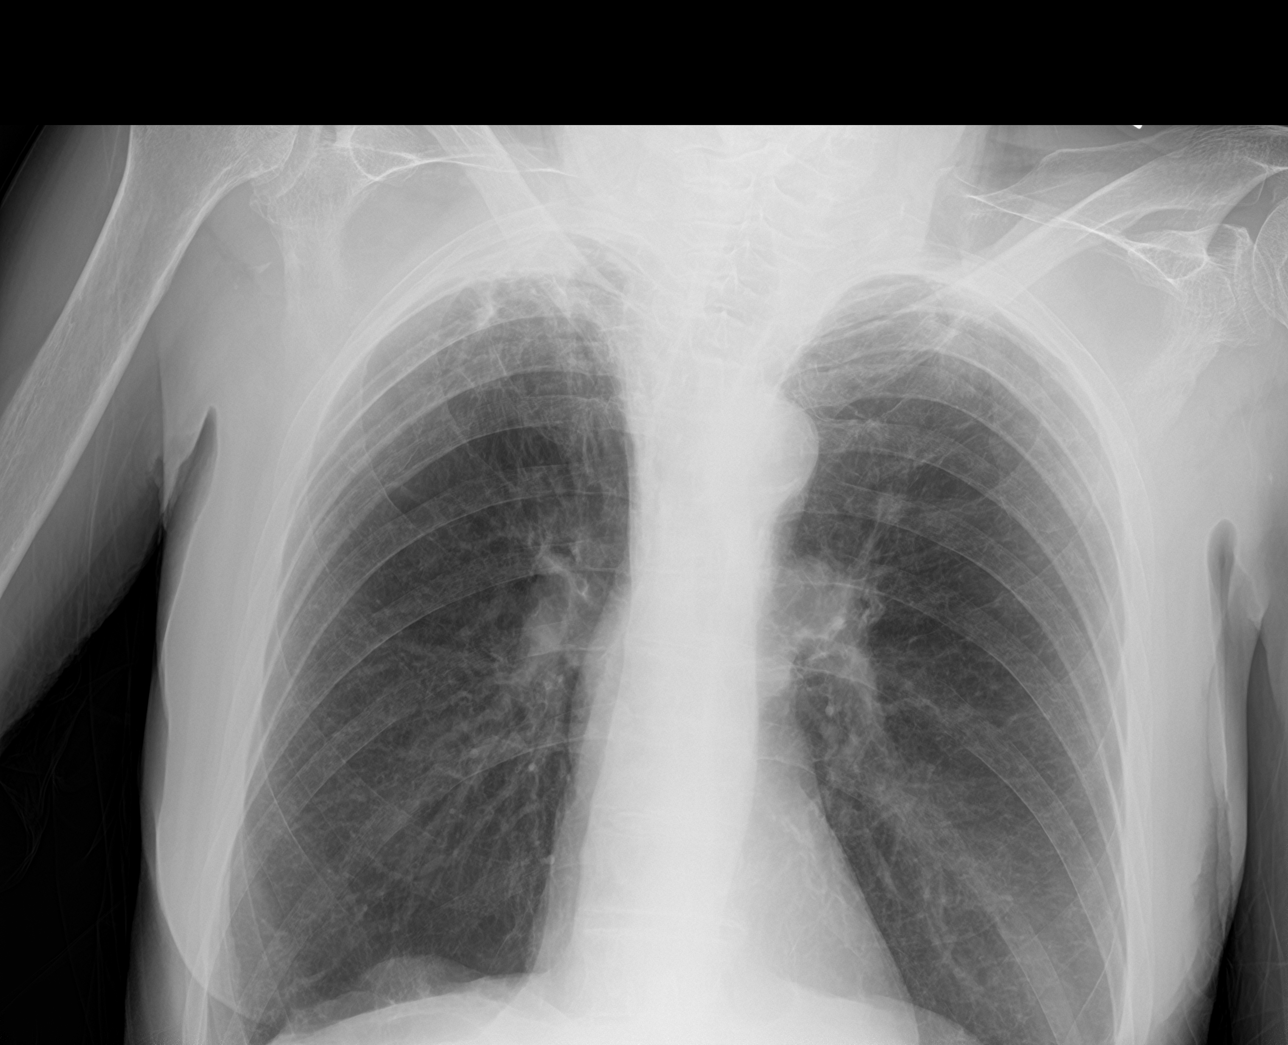
[im 2/2]
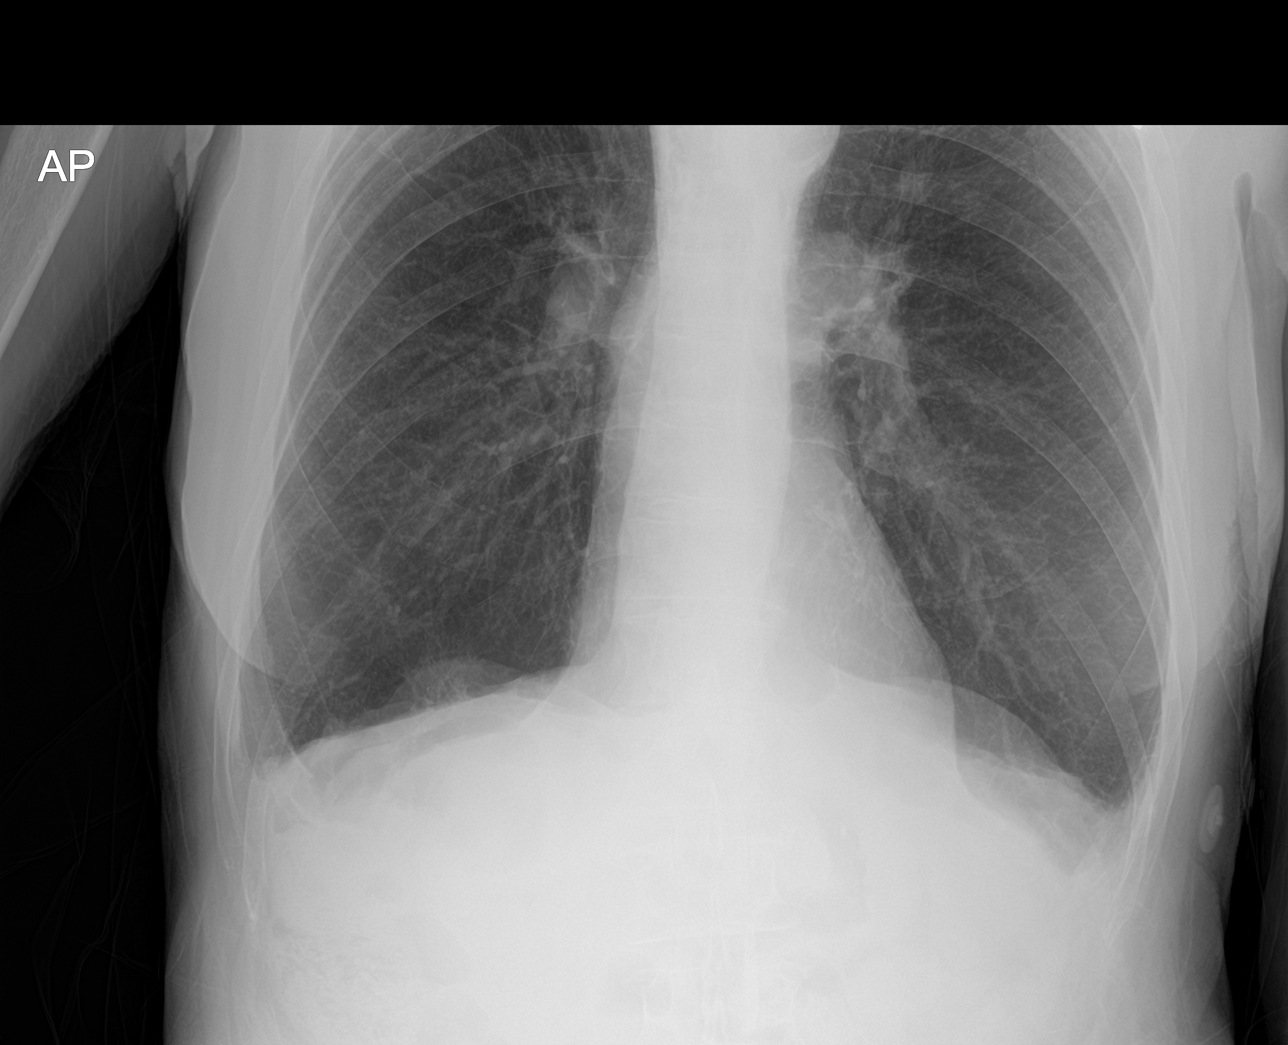

[3 of 3 positions shown; findings below may reference images not displayed]

FINDINGS: Cardiac silhouette is normal in size and configuration. No
mediastinal or hilar masses.

Lungs are hyperexpanded. There is stable scarring at the right apex.
No evidence of pneumonia or pulmonary edema. No pleural effusion or
pneumothorax.

There are 3 contiguous compression fractures of the midthoracic
spine, T6, T7 and T8, mild of T6 and moderate of T7 and T8. The T7
fracture was present on the prior CT. The other 2 fractures are new
since that exam.

No other fractures.  Skeletal structures are demineralized.
IMPRESSION: 1. No acute cardiopulmonary disease.
2. COPD.  Chronic right apical lung scarring.
3. Fractures of T6, T7 and T8. T7 was old. The T6 and T8 fractures
are new since the CT dated [DATE].

## 2017-11-02 IMAGING — CT CT HEAD W/O CM
4 series · 15 of 47 positions shown, 17 images · non-contrast
Comparison: None.

CLINICAL DATA: Patient states two falls this weekend with multiple
skin tears. Pt states he doesn't know why he fell and "didn't know
it was going to happen until I was on the ground". Does not take
blood thinners.

EXAM:
CT HEAD WITHOUT CONTRAST
TECHNIQUE: Contiguous axial images were obtained from the base of the skull
through the vertex without intravenous contrast.

[Series 3: head wo · axial · 0.46mm/px · z∈[-162,-32]mm · 7 of 36 slices shown, 9 images]
[im 5/36  brain]
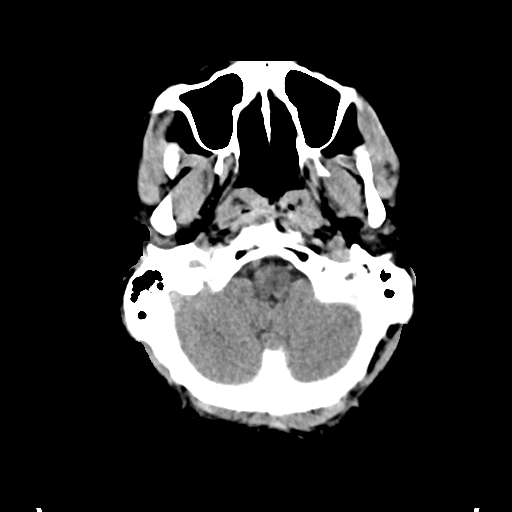
[im 5/36  bone]
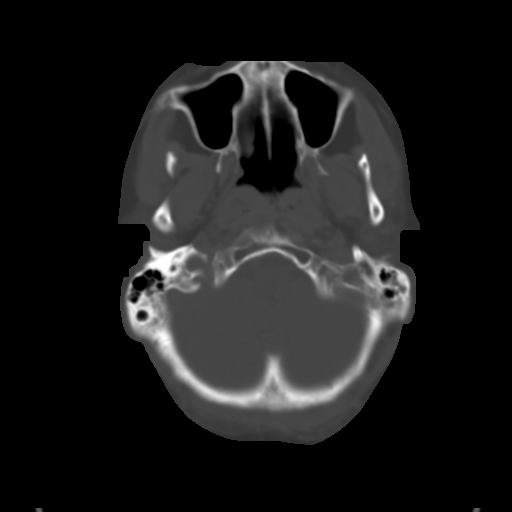
[im 9/36  brain]
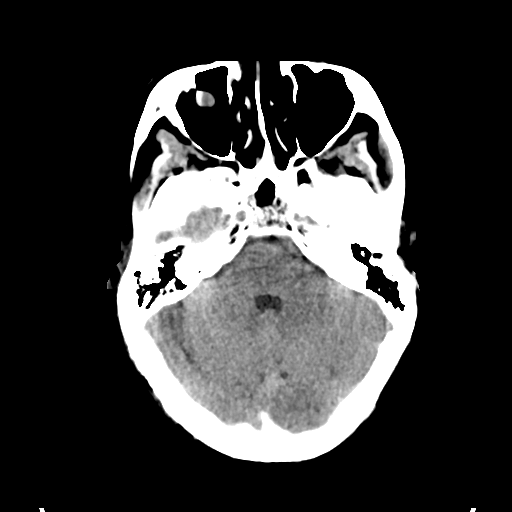
[im 14/36  brain]
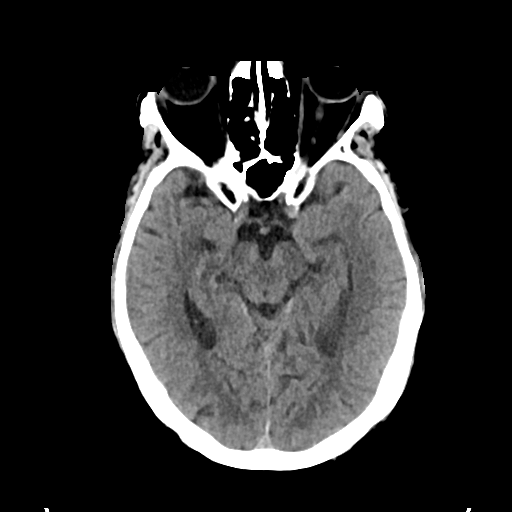
[im 18/36  brain]
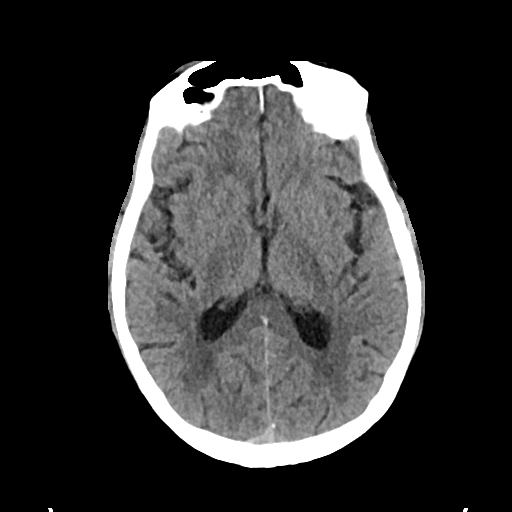
[im 22/36  brain]
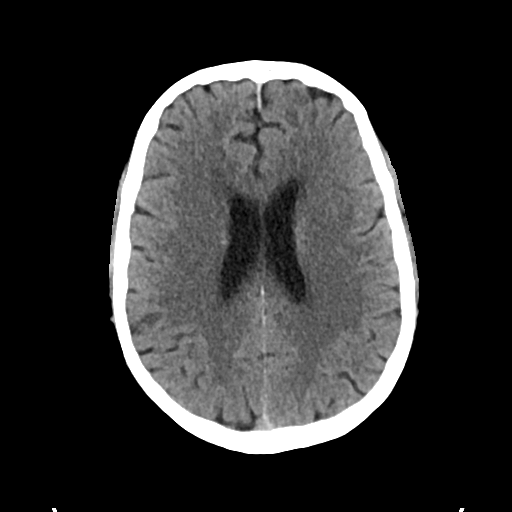
[im 22/36  bone]
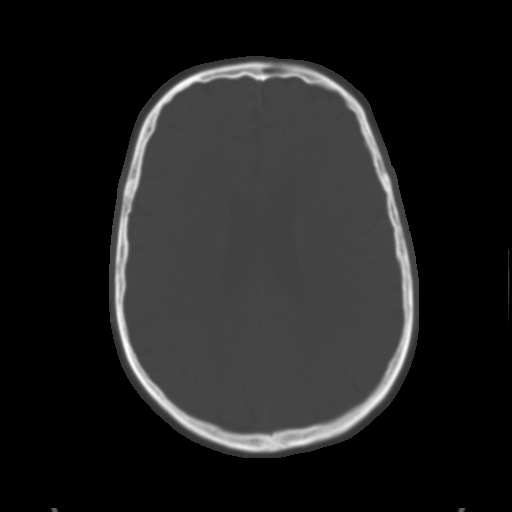
[im 27/36  brain]
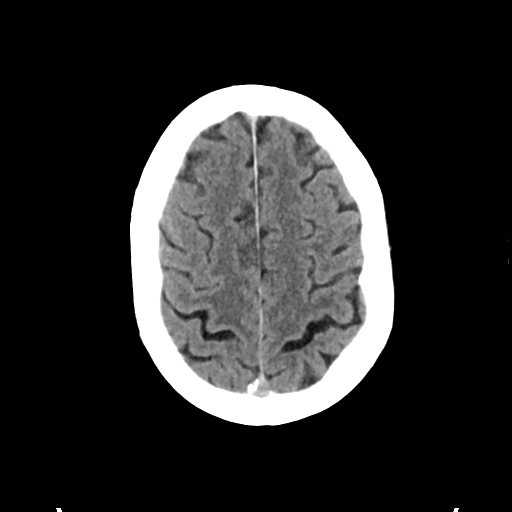
[im 31/36  brain]
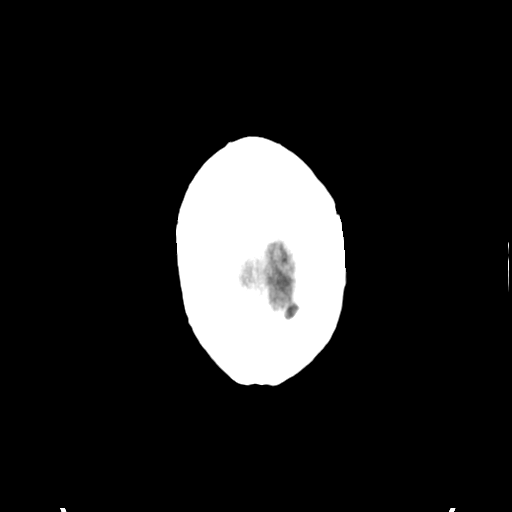

[Series 4: head bone · axial · 0.46mm/px · z∈[-166,-148]mm · 2 of 90 slices shown]
[im 9/90  bone]
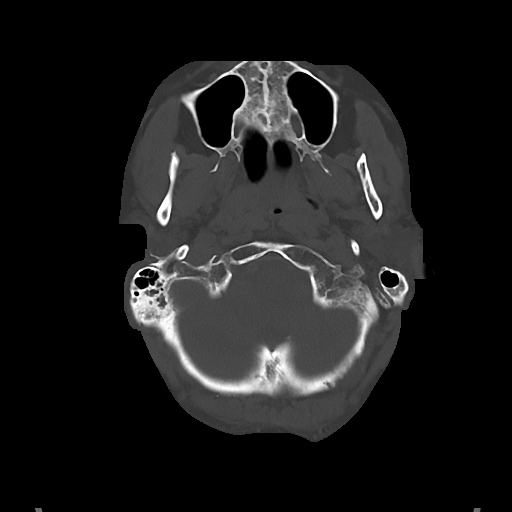
[im 18/90  bone]
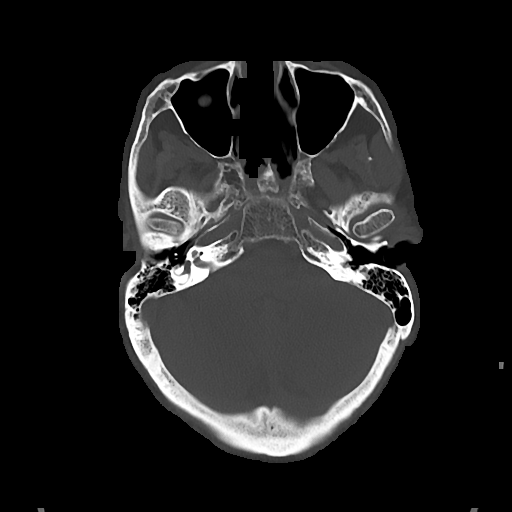

[Series 5: cor soft · coronal · 0.34mm/px · 3 of 68 slices shown]
[im 23/68  brain]
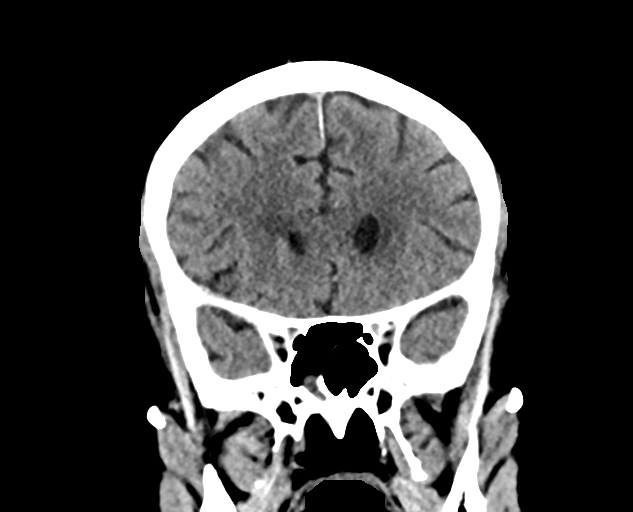
[im 30/68  brain]
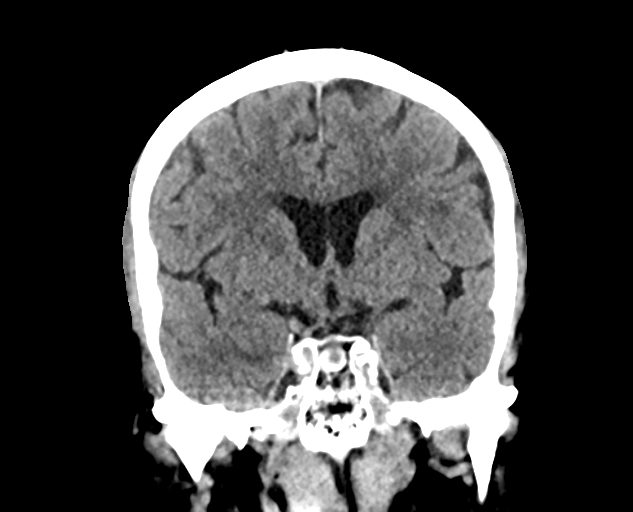
[im 38/68  brain]
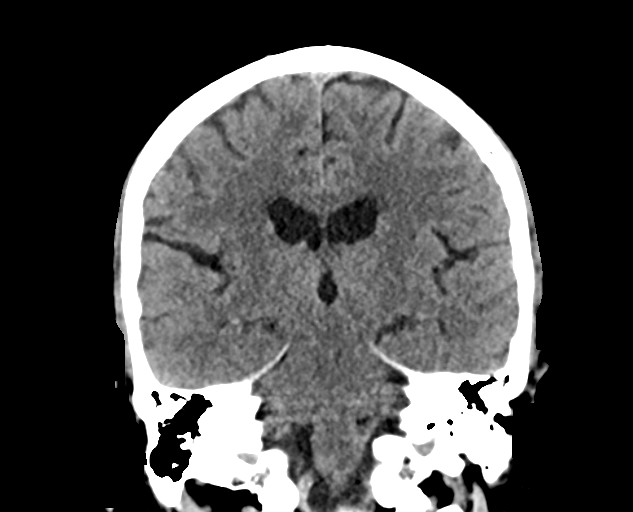

[Series 6: sag soft · sagittal · 0.35mm/px · 3 of 50 slices shown]
[im 17/50  brain]
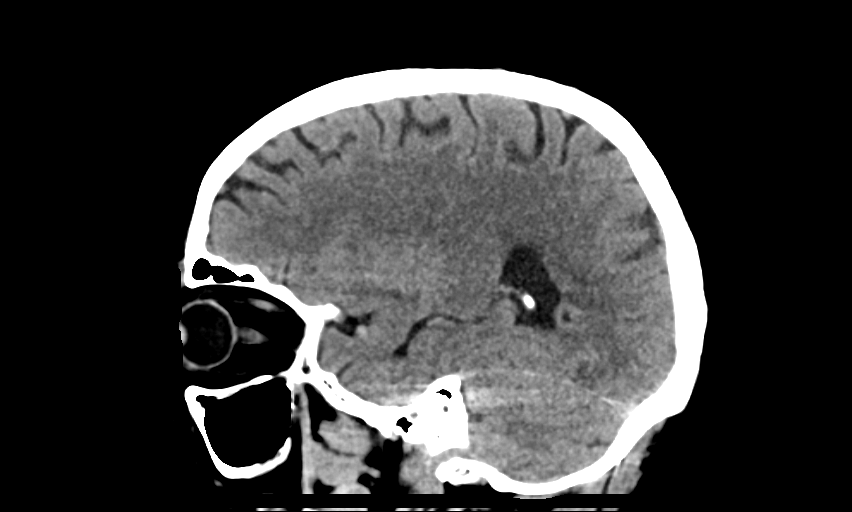
[im 25/50  brain]
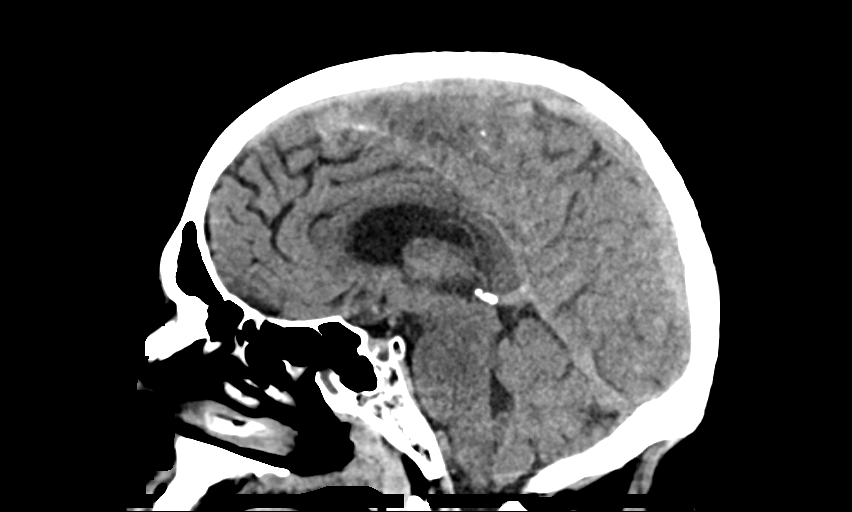
[im 33/50  brain]
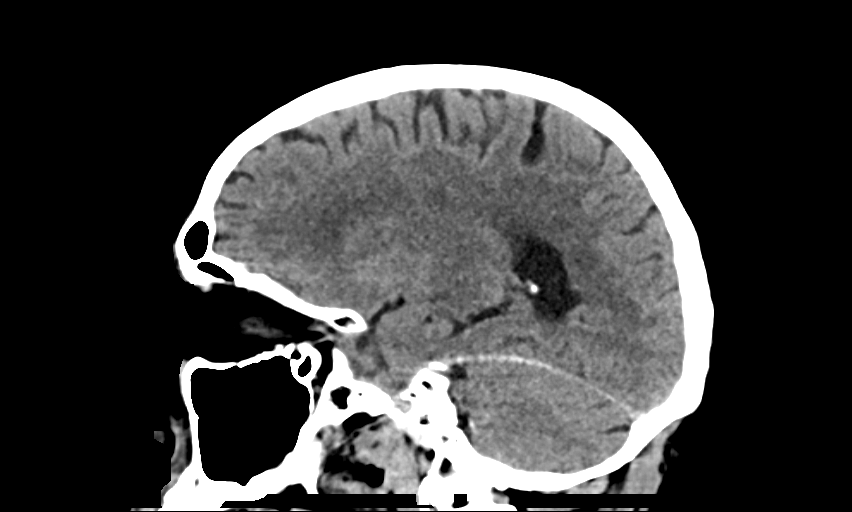

[15 of 47 positions shown; findings below may reference images not displayed]

FINDINGS: Brain: No evidence of acute infarction, hemorrhage, hydrocephalus,
extra-axial collection or mass lesion/mass effect.

Minor periventricular white matter hypoattenuation consistent with
chronic microvascular ischemic change.

Vascular: No hyperdense vessel or unexpected calcification.

Skull: Normal. Negative for fracture or focal lesion.

Sinuses/Orbits: Globes and orbits are unremarkable. Small mucous
retention cyst in the right sphenoid sinus and right maxillary
sinus. Clear mastoid air cells.

Other: None.
IMPRESSION: 1. No acute intracranial abnormalities.
2. Minor chronic microvascular ischemic change.

## 2017-11-02 MED ORDER — ONDANSETRON HCL 4 MG/2ML IJ SOLN: 4.0000 mg | Freq: Four times a day (QID) | INTRAMUSCULAR | Status: DC | PRN

## 2017-11-02 MED ORDER — PREDNISONE 10 MG PO TABS
10.0000 mg | ORAL_TABLET | Freq: Every day | ORAL | Status: DC
  Administered 2017-11-03 – 2017-11-05 (×3): 10 mg via ORAL
  Filled 2017-11-02 (×3): qty 1

## 2017-11-02 MED ORDER — SENNOSIDES-DOCUSATE SODIUM 8.6-50 MG PO TABS: 1.0000 | ORAL_TABLET | Freq: Every evening | ORAL | Status: DC | PRN

## 2017-11-02 MED ORDER — HYDROCODONE-ACETAMINOPHEN 5-325 MG PO TABS
1.0000 | ORAL_TABLET | ORAL | Status: DC | PRN
  Administered 2017-11-03 – 2017-11-05 (×11): 2 via ORAL
  Filled 2017-11-02 (×11): qty 2

## 2017-11-02 MED ORDER — SODIUM CHLORIDE 0.9% FLUSH
3.0000 mL | Freq: Two times a day (BID) | INTRAVENOUS | Status: DC
  Administered 2017-11-03 – 2017-11-05 (×5): 3 mL via INTRAVENOUS

## 2017-11-02 MED ORDER — MORPHINE SULFATE (PF) 4 MG/ML IV SOLN
4.0000 mg | Freq: Once | INTRAVENOUS | Status: AC
  Administered 2017-11-02: 4 mg via INTRAVENOUS
  Filled 2017-11-02: qty 1

## 2017-11-02 MED ORDER — MOMETASONE FURO-FORMOTEROL FUM 200-5 MCG/ACT IN AERO
2.0000 | INHALATION_SPRAY | Freq: Two times a day (BID) | RESPIRATORY_TRACT | Status: DC
  Filled 2017-11-02: qty 8.8

## 2017-11-02 MED ORDER — SODIUM CHLORIDE 0.9% FLUSH
3.0000 mL | Freq: Two times a day (BID) | INTRAVENOUS | Status: DC
  Administered 2017-11-03: 3 mL via INTRAVENOUS

## 2017-11-02 MED ORDER — SODIUM CHLORIDE 0.9 % IV SOLN: 250.0000 mL | INTRAVENOUS | Status: DC | PRN

## 2017-11-02 MED ORDER — LIDOCAINE-EPINEPHRINE-TETRACAINE (LET) SOLUTION
6.0000 mL | Freq: Once | NASAL | Status: AC
  Administered 2017-11-02: 6 mL via TOPICAL
  Filled 2017-11-02: qty 6

## 2017-11-02 MED ORDER — MORPHINE SULFATE (PF) 4 MG/ML IV SOLN
8.0000 mg | Freq: Once | INTRAVENOUS | Status: AC
  Administered 2017-11-02: 8 mg via INTRAVENOUS
  Filled 2017-11-02: qty 2

## 2017-11-02 MED ORDER — ACETAMINOPHEN 325 MG PO TABS: 650.0000 mg | ORAL_TABLET | Freq: Four times a day (QID) | ORAL | Status: DC | PRN

## 2017-11-02 MED ORDER — MONTELUKAST SODIUM 10 MG PO TABS
10.0000 mg | ORAL_TABLET | Freq: Every day | ORAL | Status: DC
  Administered 2017-11-03: 10 mg via ORAL
  Filled 2017-11-02 (×3): qty 1

## 2017-11-02 MED ORDER — ACETAMINOPHEN 650 MG RE SUPP: 650.0000 mg | Freq: Four times a day (QID) | RECTAL | Status: DC | PRN

## 2017-11-02 MED ORDER — AZITHROMYCIN 250 MG PO TABS
250.0000 mg | ORAL_TABLET | ORAL | Status: DC
  Administered 2017-11-03 – 2017-11-05 (×2): 250 mg via ORAL
  Filled 2017-11-02 (×2): qty 1

## 2017-11-02 MED ORDER — SODIUM CHLORIDE 0.9 % IV BOLUS
1000.0000 mL | Freq: Once | INTRAVENOUS | Status: AC
  Administered 2017-11-02: 1000 mL via INTRAVENOUS

## 2017-11-02 MED ORDER — TIOTROPIUM BROMIDE MONOHYDRATE 18 MCG IN CAPS
18.0000 ug | ORAL_CAPSULE | Freq: Every day | RESPIRATORY_TRACT | Status: DC
  Administered 2017-11-03: 18 ug via RESPIRATORY_TRACT
  Filled 2017-11-02: qty 5

## 2017-11-02 MED ORDER — ALBUTEROL SULFATE (2.5 MG/3ML) 0.083% IN NEBU
2.5000 mg | INHALATION_SOLUTION | RESPIRATORY_TRACT | Status: DC | PRN
Start: 2017-11-02 — End: 2017-11-05

## 2017-11-02 MED ORDER — SODIUM CHLORIDE 0.9% FLUSH: 3.0000 mL | INTRAVENOUS | Status: DC | PRN

## 2017-11-02 MED ORDER — BISACODYL 5 MG PO TBEC: 5.0000 mg | DELAYED_RELEASE_TABLET | Freq: Every day | ORAL | Status: DC | PRN

## 2017-11-02 MED ORDER — ONDANSETRON HCL 4 MG PO TABS: 4.0000 mg | ORAL_TABLET | Freq: Four times a day (QID) | ORAL | Status: DC | PRN

## 2017-11-02 MED ORDER — FLUTICASONE PROPIONATE 50 MCG/ACT NA SUSP
2.0000 | Freq: Every day | NASAL | Status: DC
  Administered 2017-11-03 – 2017-11-05 (×3): 2 via NASAL
  Filled 2017-11-02: qty 16

## 2017-11-02 NOTE — ED Notes (Signed)
Pt reports not having O2 on while walking from the kitchen to the living room prior to 2 falls.

## 2017-11-02 NOTE — H&P (Signed)
History and Physical    Francisco [name] NIO:[id number] DOB: 1945/12/17 DOA: 11/02/2017  PCP: [name], MD   Patient coming from: Home  Chief Complaint: Syncope x2, right forearm laceration   HPI: Francisco [name] is a 72 y.o. 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  Patient reports that he been in his usual state of health until 3 days ago when he was ambulating through his house and ended up on the ground.  There was no prodrome and he returned to his usual state rapidly.  He had a another episode today.  He denies hitting his head but his family believes that he did.  Denies headache, change in vision or hearing, or focal numbness or weakness.  He suffered some skin tears and a right dorsal forearm laceration with a fall.  He denies experiencing any chest pain or palpitations.  He has chronic bilateral lower extremity edema but denies tenderness.  No recent fevers or chills.  Respiratory status has been stable.  ED Course: Upon arrival to the ED, patient is found to be afebrile, saturating adequately on his usual supplemental oxygen, slightly tachycardic, and vitals otherwise normal.  EKG features sinus tachycardia with rate 104, LAFB, and nonspecific T wave abnormality.  Chest x-ray is negative for acute cardiopulmonary disease, but notable for fractures of thoracic vertebrae 6, 7, and 8, with T6 and T8 fractures new since January.  LFTs are unremarkable but basic chemistries not performed.  CBC features a slight normocytic anemia and a mild chronic leukocytosis.  Troponin and BNP are normal.  He remains slightly tachycardic with stable blood pressure and no apparent respiratory distress in the ED, and he will be observed on the telemetry unit for ongoing evaluation and management of syncope.  Review of Systems:  All  other systems reviewed and apart from HPI, are negative.  Past Medical History:  Diagnosis Date  . Chest x-ray abnormality   . Chronic rhinitis    -Sinus Ct 08/01/2009 >> Bilateral maxillary sinusitis with some mucosal thickeningin the sphenoid and frontal sinuses as well with air fluid levels present -chronic rhinitis flyer Aug 04, 2009  . COPD (chronic obstructive pulmonary disease) [facility])    PFT's rec Jul 17, 2009  . Onychomycosis    Dr. Judi Cong    Past Surgical History:  Procedure Laterality Date  . APPENDECTOMY    . TONSILLECTOMY       reports that he has been smoking pipe and cigarettes.  He has a 20.00 pack-year smoking history. He has never used smokeless tobacco. He reports that he drinks about 10.8 oz of alcohol per week. He reports that he does not use drugs.  Allergies  Allergen Reactions  . Tape Other (See Comments)    SKIN IS VERY THIN; CAN ONLY USE COBAN WRAPS DUE TO CONDITION OF SKIN!!    Family History  Problem Relation Age of Onset  . Heart disease Mother        CABG in her 59s, nonsmoker  . Cancer Mother   . Stroke Father   . Heart disease Father        Died of MI at age 64, smoker  . Hepatitis Sister   . Coronary artery disease Other        male 1st degree relative <60     Prior to Admission medications  Medication Sig Start Date End Date Taking? Authorizing Provider  albuterol (VENTOLIN HFA) 108 (90 Base) MCG/ACT inhaler USE 2 PUFFS EVERY 6 HOURS  AS NEEDED FOR WHEEZING 07/30/17  Yes Byrum, Rose Fillers, MD  azithromycin (ZITHROMAX) 250 MG tablet TAKE 1 TABLET BY MOUTH ON  MONDAY, WEDNESDAY, AND  FRIDAY 07/30/17  Yes Collene Gobble, MD  fluticasone (FLONASE) 50 MCG/ACT nasal spray USE 2 SPRAYS IN EACH  NOSTRIL DAILY 07/30/17  Yes Collene Gobble, MD  montelukast (SINGULAIR) 10 MG tablet TAKE 1 TABLET BY MOUTH  EVERY NIGHT AT BEDTIME 09/23/17  Yes Collene Gobble, MD  naproxen sodium (ALEVE) 220 MG tablet Take 220 mg by mouth every 8 (eight) hours.   Yes  [provider]  OXYGEN Inhale 2 L into the lungs continuous.   Yes [provider]  predniSONE (DELTASONE) 10 MG tablet TAKE 1 TABLET BY MOUTH  DAILY WITH BREAKFAST 09/23/17  Yes Byrum, Rose Fillers, MD  SPIRIVA RESPIMAT 2.5 MCG/ACT AERS USE 2 SPRAYS (INHALATIONS)  ONCE DAILY 07/30/17  Yes Byrum, Rose Fillers, MD  SYMBICORT 160-4.5 MCG/ACT inhaler USE 2 PUFFS BY MOUTH TWO  TIMES DAILY 05/27/17  Yes Young, Tarri Fuller D, MD  terbinafine (LAMISIL) 250 MG tablet Take 250 mg by mouth See admin instructions. Take 1 tablet daily for 7 days every month   Yes [provider]  triamterene-hydrochlorothiazide (MAXZIDE-25) 37.5-25 MG tablet TAKE 1 TABLET BY MOUTH  DAILY AS NEEDED. USE  SPARINGLY Patient taking differently: Take 1 tablet by mouth every other day 10/15/17  Yes Collene Gobble, MD    Physical Exam: Vitals:   11/02/17 2115 11/02/17 2145 11/02/17 2200 11/02/17 2230  BP: (!) 127/56 121/89 (!) 155/79 (!) 153/78  Pulse: (!) 108 (!) 103 (!) 102 100  Resp:      Temp:      TempSrc:      SpO2: 97% 97% 96% 97%      Constitutional: NAD, calm  Eyes: PERTLA, lids and conjunctivae normal ENMT: Mucous membranes are moist. Posterior pharynx clear of any exudate or lesions.   Neck: normal, supple, no masses, no thyromegaly Respiratory: Breath sounds diminished bilaterally with slight expiratory wheeze. No accessory muscle use.  Cardiovascular: Rate ~110 and regular. Pitting edema to bilateral LE's. No significant JVD. Abdomen: No distension, no tenderness, soft. Bowel sounds normal.  Musculoskeletal: no clubbing / cyanosis. No joint deformity upper and lower extremities.   Skin: Laceration to dorsal right forearm with muscle layer exposed. Hyperpigmentation to bilateral LE's distally in gaiter distribution with bullae and serous weeping. Warm, dry, well-perfused. Neurologic: CN 2-12 grossly intact. Sensation intact. Strength 5/5 in all 4 limbs. Fine resting tremor to bilateral UE's.    Psychiatric: Alert and oriented x 3. Pleasant and cooperative.     Labs on Admission: I have personally reviewed following labs and imaging studies  CBC: Recent Labs  Lab 11/02/17 1957  WBC 11.3*  HGB 12.8*  HCT 37.1*  MCV 93.5  PLT 627   Basic Metabolic Panel: Recent Labs  Lab 11/02/17 1957  MG 1.9   GFR: CrCl cannot be calculated (Patient's most recent lab result is older than the maximum 21 days allowed.). Liver Function Tests: Recent Labs  Lab 11/02/17 1957  AST 24  ALT 17  ALKPHOS 86  BILITOT 0.9  PROT 6.5  ALBUMIN 3.3*   Recent Labs  Lab 11/02/17 1957  LIPASE 28   No results for input(s): AMMONIA in the last 168 hours. Coagulation Profile:  No results for input(s): INR, PROTIME in the last 168 hours. Cardiac Enzymes: No results for input(s): CKTOTAL, CKMB, CKMBINDEX, TROPONINI in the last 168 hours. BNP (last 3 results) No results for input(s): PROBNP in the last 8760 hours. HbA1C: No results for input(s): HGBA1C in the last 72 hours. CBG: No results for input(s): GLUCAP in the last 168 hours. Lipid Profile: No results for input(s): CHOL, HDL, LDLCALC, TRIG, CHOLHDL, LDLDIRECT in the last 72 hours. Thyroid Function Tests: No results for input(s): TSH, T4TOTAL, FREET4, T3FREE, THYROIDAB in the last 72 hours. Anemia Panel: No results for input(s): VITAMINB12, FOLATE, FERRITIN, TIBC, IRON, RETICCTPCT in the last 72 hours. Urine analysis:    Component Value Date/Time   COLORURINE YELLOW 06/25/2017 0831   APPEARANCEUR CLEAR 06/25/2017 0831   LABSPEC 1.010 06/25/2017 0831   PHURINE 6.0 06/25/2017 0831   GLUCOSEU NEGATIVE 06/25/2017 0831   HGBUR SMALL (A) 06/25/2017 0831   HGBUR trace-lysed 02/12/2010 0819   BILIRUBINUR NEGATIVE 06/25/2017 0831   BILIRUBINUR neg 06/05/2016 1117   KETONESUR NEGATIVE 06/25/2017 0831   PROTEINUR neg 06/05/2016 1117   UROBILINOGEN 0.2 06/25/2017 0831   NITRITE NEGATIVE 06/25/2017 0831   LEUKOCYTESUR NEGATIVE  06/25/2017 0831   Sepsis Labs: @LABRCNTIP (procalcitonin:4,lacticidven:4) )No results found for this or any previous visit (from the past 240 hour(s)).   Radiological Exams on Admission: Dg Chest 2 View  Result Date: 11/02/2017 CLINICAL DATA:  Pt reports 2 falls this weekend with multiple skin tears. Pt states he doesn't know why he fell. Pt having left lower lateral rib pain. EXAM: CHEST - 2 VIEW COMPARISON:  10/12/2013.  Chest CT, 07/17/2017. FINDINGS: Cardiac silhouette is normal in size and configuration. No mediastinal or hilar masses. Lungs are hyperexpanded. There is stable scarring at the right apex. No evidence of pneumonia or pulmonary edema. No pleural effusion or pneumothorax. There are 3 contiguous compression fractures of the midthoracic spine, T6, T7 and T8, mild of T6 and moderate of T7 and T8. The T7 fracture was present on the prior CT. The other 2 fractures are new since that exam. No other fractures.  Skeletal structures are demineralized. IMPRESSION: 1. No acute cardiopulmonary disease. 2. COPD.  Chronic right apical lung scarring. 3. Fractures of T6, T7 and T8. T7 was old. The T6 and T8 fractures are new since the CT dated 07/17/2017. Electronically Signed   By: Lajean Manes M.D.   On: 11/02/2017 19:25   Ct Head Wo Contrast  Result Date: 11/02/2017 CLINICAL DATA:  Patient states two falls this weekend with multiple skin tears. Pt states he doesn't know why he fell and "didn't know it was going to happen until I was on the ground". Does not take blood thinners. EXAM: CT HEAD WITHOUT CONTRAST TECHNIQUE: Contiguous axial images were obtained from the base of the skull through the vertex without intravenous contrast. COMPARISON:  None. FINDINGS: Brain: No evidence of acute infarction, hemorrhage, hydrocephalus, extra-axial collection or mass lesion/mass effect. Minor periventricular white matter hypoattenuation consistent with chronic microvascular ischemic change. Vascular: No  hyperdense vessel or unexpected calcification. Skull: Normal. Negative for fracture or focal lesion. Sinuses/Orbits: Globes and orbits are unremarkable. Small mucous retention cyst in the right sphenoid sinus and right maxillary sinus. Clear mastoid air cells. Other: None. IMPRESSION: 1. No acute intracranial abnormalities. 2. Minor chronic microvascular ischemic change. Electronically Signed   By: Lajean Manes M.D.   On: 11/02/2017 19:21    EKG: Independently reviewed. Sinus tachycardia (rate 104), LAFB, non-specific T-wave abnormality.  Assessment/Plan   1. Syncope  - Presents following 2 syncopal episodes in past 3 days without prodrome  - He is slightly tachycardic and PE considered, but has chronic tachycardia based on office visit vitals and there is no other suggestion of VTE  - No arrhythmia on EKG or monitor  - Continue cardiac monitoring, check orthostatic vitals, echocardiogram    2. Steroid-dependent COPD; chronic hypoxic respiratory failure   - Stable  - Continue ICS/LABA, Spiriva, prn albuterol, daily prednisone, supplemental O2    3. Forearm laceration  - Patient suffered laceration to dorsal right forearm with the syncopal episode  - Hand surgery was consulted by EDP and recommended wound-care RN eval  - Continue wound care, consult with wound-care RN    4. Venous stasis dermatitis  - Bilateral stasis dermatitis with serous weeping   - Elevate legs, consult with wound-care RN, consider compression stockings    5. Hypertension  - BP at goal  - Managed at home with triamterene-HCTZ, held on admission in light of hyponatremia     6. Thoracic compression fractures  - T6, T7, and T8 fractures noted on CXR; T6 and T8 fxs new since January  - Patient does complain of back pain  - Pain-control, PT eval   7. Hyponatremia  - Serum sodium is 129 on admission - Appears to be chronic; doubt related to syncope  - Received 1 liter NS in ED  - Hold HCTZ, repeat chem panel in  am     DVT prophylaxis: SCD's  Code Status: Full  Family Communication: Wife and daughter updated  Consults called: Hand surgery consulted by ED physician  Admission status: Observation    Vianne Bulls, MD [facility] Pager [phone]  If 7PM-7AM, please contact night-coverage [email or web address] Password [facility]  11/02/2017, 11:03 PM

## 2017-11-02 NOTE — ED Provider Notes (Signed)
[facility] EMERGENCY DEPARTMENT Provider Note   CSN: [id number] Arrival date & time: 11/02/17  1718     History   Chief Complaint Chief Complaint  Patient presents with  . Loss of Consciousness  . Fall    HPI OSVALDO LAMPING II is a 72 y.o. male.  HPI Presents after 2 falls, each during an episode of near syncope/weakness. Patient has multiple medical issues, including COPD, wears oxygen 24/7. He has no cardiac history. He is here with his wife who assists with the HPI. They note that he was in his usual state of health, weak, with oxygen dependent, but without episodes of falling or syncope until the day before yesterday. Patient had similar events on the day and again today, described as lightheadedness, when ambulating without supplemental oxygen, and minor fall.  On is unclear if he had complete syncope, but he notes that upon reaching the ground he had full awareness, and it did not have head trauma. He takes no blood thinning medication. However, since the fall he has had pain in his right forearm, left elbow substantially, with skin tears that were caused by the first fall, worsened by today's fall. No loss of sensation or strength in the hands, bilaterally, nor any ongoing confusion, disorientation. Patient also complains of bilateral lower extremity swelling, and seeping from his left leg. Past Medical History:  Diagnosis Date  . Chest x-ray abnormality   . Chronic rhinitis    -Sinus Ct 08/01/2009 >> Bilateral maxillary sinusitis with some mucosal thickeningin the sphenoid and frontal sinuses as well with air fluid levels present -chronic rhinitis flyer Aug 04, 2009  . COPD (chronic obstructive pulmonary disease) [facility])    PFT's rec Jul 17, 2009  . Onychomycosis    Dr. Judi Cong    Patient Active Problem List   Diagnosis Date Noted  . Chronic respiratory failure with hypoxia ([place]) 09/23/2017  . Pleuritic pain 07/17/2017  . BPH associated with  nocturia 06/25/2017  . Leukocytosis 12/19/2016  . Hyperglycemia 06/12/2016  . Hyperlipidemia 07/22/2014  . Onychomycosis 07/22/2014  . Left ankle swelling 07/22/2014  . History of skin cancer 05/17/2014  . Chronic rhinitis 10/28/2012  . Pulmonary nodule 09/23/2011  . TOBACCO USE 08/23/2008  . COPD (chronic obstructive pulmonary disease) with emphysema ([place]) 08/23/2008  . History of colonic polyps 08/23/2008    Past Surgical History:  Procedure Laterality Date  . APPENDECTOMY    . TONSILLECTOMY          Home Medications    Prior to Admission medications   Medication Sig Start Date End Date Taking? Authorizing Provider  albuterol (VENTOLIN HFA) 108 (90 Base) MCG/ACT inhaler USE 2 PUFFS EVERY 6 HOURS  AS NEEDED FOR WHEEZING 07/30/17  Yes Byrum, Rose Fillers, MD  azithromycin (ZITHROMAX) 250 MG tablet TAKE 1 TABLET BY MOUTH ON  MONDAY, WEDNESDAY, AND  FRIDAY 07/30/17  Yes Collene Gobble, MD  fluticasone (FLONASE) 50 MCG/ACT nasal spray USE 2 SPRAYS IN EACH  NOSTRIL DAILY 07/30/17  Yes Collene Gobble, MD  montelukast (SINGULAIR) 10 MG tablet TAKE 1 TABLET BY MOUTH  EVERY NIGHT AT BEDTIME 09/23/17  Yes Collene Gobble, MD  naproxen sodium (ALEVE) 220 MG tablet Take 220 mg by mouth every 8 (eight) hours.   Yes [provider]  OXYGEN Inhale 2 L into the lungs continuous.   Yes [provider]  predniSONE (DELTASONE) 10 MG tablet TAKE 1 TABLET BY MOUTH  DAILY WITH BREAKFAST 09/23/17  Yes Collene Gobble, MD  SPIRIVA RESPIMAT 2.5 MCG/ACT AERS USE 2 SPRAYS (INHALATIONS)  ONCE DAILY 07/30/17  Yes Collene Gobble, MD  SYMBICORT 160-4.5 MCG/ACT inhaler USE 2 PUFFS BY MOUTH TWO  TIMES DAILY 05/27/17  Yes Young, Tarri Fuller D, MD  terbinafine (LAMISIL) 250 MG tablet Take 250 mg by mouth See admin instructions. Take 1 tablet daily for 7 days every month   Yes [provider]  triamterene-hydrochlorothiazide (MAXZIDE-25) 37.5-25 MG tablet TAKE 1 TABLET BY MOUTH  DAILY AS NEEDED.  USE  SPARINGLY Patient taking differently: Take 1 tablet by mouth every other day 10/15/17  Yes Byrum, Rose Fillers, MD  predniSONE (DELTASONE) 10 MG tablet TAKE 1 TABLET BY MOUTH  DAILY WITH BREAKFAST Patient not taking: Reported on 11/02/2017 01/01/17   Collene Gobble, MD    Family History Family History  Problem Relation Age of Onset  . Heart disease Mother        CABG in her 107s, nonsmoker  . Cancer Mother   . Stroke Father   . Heart disease Father        Died of MI at age 64, smoker  . Hepatitis Sister   . Coronary artery disease Other        male 1st degree relative <60    Social History Social History   Tobacco Use  . Smoking status: Current Every Day Smoker    Packs/day: 2.00    Years: 10.00    Pack years: 20.00    Types: Pipe, Cigarettes  . Smokeless tobacco: Never Used  . Tobacco comment: Smokes pipe up to 2-3 times a day  Substance Use Topics  . Alcohol use: Yes    Alcohol/week: 10.8 oz    Types: 18 Cans of beer per week    Comment: daily  . Drug use: No     Allergies   Tape   Review of Systems Review of Systems  Constitutional:       Per HPI, otherwise negative  HENT:       Per HPI, otherwise negative  Respiratory:       Per HPI, otherwise negative  Cardiovascular:       Per HPI, otherwise negative  Gastrointestinal: Negative for vomiting.  Endocrine:       Negative aside from HPI  Genitourinary:       Neg aside from HPI   Musculoskeletal:       Per HPI, otherwise negative  Skin: Positive for wound.  Neurological: Positive for weakness and light-headedness. Negative for syncope.  Hematological: Bruises/bleeds easily.     Physical Exam Updated Vital Signs BP (!) 127/56   Pulse (!) 108   Temp 98.3 F (36.8 C) (Oral)   Resp 16   SpO2 97%   Physical Exam  Constitutional: He is oriented to person, place, and time. He has a sickly appearance.  HENT:  Head: Normocephalic and atraumatic.  Eyes: Conjunctivae and EOM are normal.    Cardiovascular: Normal rate and regular rhythm.  Pulmonary/Chest: Effort normal. No stridor. No respiratory distress.  Abdominal: He exhibits no distension.  Musculoskeletal: He exhibits no edema.       Legs: Neurological: He is alert and oriented to person, place, and time.  Skin: Skin is warm and dry.     Psychiatric: He has a normal mood and affect.  Nursing note and vitals reviewed.      ED Treatments / Results  Labs (all labs ordered are listed, but only abnormal results  are displayed) Labs Reviewed  CBC - Abnormal; Notable for the following components:      Result Value   WBC 11.3 (*)    RBC 3.97 (*)    Hemoglobin 12.8 (*)    HCT 37.1 (*)    All other components within normal limits  HEPATIC FUNCTION PANEL - Abnormal; Notable for the following components:   Albumin 3.3 (*)    All other components within normal limits  LIPASE, BLOOD  MAGNESIUM  BRAIN NATRIURETIC PEPTIDE  URINALYSIS, ROUTINE W REFLEX MICROSCOPIC  I-STAT TROPONIN, ED  CBG MONITORING, ED  I-STAT TROPONIN, ED    EKG EKG Interpretation  Date/Time:  Sunday November 02 2017 18:15:51 EDT Ventricular Rate:  104 PR Interval:  160 QRS Duration: 74 QT Interval:  352 QTC Calculation: 462 R Axis:   -78 Text Interpretation:  Sinus tachycardia Right atrial enlargement Pulmonary disease pattern Left anterior fascicular block No significant change since last tracing Abnormal ekg Confirmed by Carmin Muskrat [phone]) on 11/02/2017 7:19:28 PM   Radiology Dg Chest 2 View  Result Date: 11/02/2017 CLINICAL DATA:  Pt reports 2 falls this weekend with multiple skin tears. Pt states he doesn't know why he fell. Pt having left lower lateral rib pain. EXAM: CHEST - 2 VIEW COMPARISON:  10/12/2013.  Chest CT, 07/17/2017. FINDINGS: Cardiac silhouette is normal in size and configuration. No mediastinal or hilar masses. Lungs are hyperexpanded. There is stable scarring at the right apex. No evidence of pneumonia or pulmonary  edema. No pleural effusion or pneumothorax. There are 3 contiguous compression fractures of the midthoracic spine, T6, T7 and T8, mild of T6 and moderate of T7 and T8. The T7 fracture was present on the prior CT. The other 2 fractures are new since that exam. No other fractures.  Skeletal structures are demineralized. IMPRESSION: 1. No acute cardiopulmonary disease. 2. COPD.  Chronic right apical lung scarring. 3. Fractures of T6, T7 and T8. T7 was old. The T6 and T8 fractures are new since the CT dated 07/17/2017. Electronically Signed   By: Lajean Manes M.D.   On: 11/02/2017 19:25   Ct Head Wo Contrast  Result Date: 11/02/2017 CLINICAL DATA:  Patient states two falls this weekend with multiple skin tears. Pt states he doesn't know why he fell and "didn't know it was going to happen until I was on the ground". Does not take blood thinners. EXAM: CT HEAD WITHOUT CONTRAST TECHNIQUE: Contiguous axial images were obtained from the base of the skull through the vertex without intravenous contrast. COMPARISON:  None. FINDINGS: Brain: No evidence of acute infarction, hemorrhage, hydrocephalus, extra-axial collection or mass lesion/mass effect. Minor periventricular white matter hypoattenuation consistent with chronic microvascular ischemic change. Vascular: No hyperdense vessel or unexpected calcification. Skull: Normal. Negative for fracture or focal lesion. Sinuses/Orbits: Globes and orbits are unremarkable. Small mucous retention cyst in the right sphenoid sinus and right maxillary sinus. Clear mastoid air cells. Other: None. IMPRESSION: 1. No acute intracranial abnormalities. 2. Minor chronic microvascular ischemic change. Electronically Signed   By: Lajean Manes M.D.   On: 11/02/2017 19:21    Procedures Procedures (including critical care time)  Medications Ordered in ED Medications  sodium chloride 0.9 % bolus 1,000 mL (has no administration in time range)  lidocaine-EPINEPHrine-tetracaine (LET)  solution (6 mLs Topical Given 11/02/17 2007)  morphine 4 MG/ML injection 4 mg (4 mg Intravenous Given 11/02/17 2007)  morphine 4 MG/ML injection 8 mg (8 mg Intravenous Given 11/02/17 2046)  Initial Impression / Assessment and Plan / ED Course  I have reviewed the triage vital signs and the nursing notes.  Pertinent labs & imaging results that were available during my care of the patient were reviewed by me and considered in my medical decision making (see chart for details).     10:00 PM On repeat exam the patient remains awake and alert. Discussed the findings with the patient, his wife, his daughter. Patient has had wound care, with application of let, and cleansing of his multiple skin tears, including the right dorsal forearm lesion. Given the occurrence 2 days ago of these wounds, no indication for suturing, and the wounds themselves are not amenable to repair. We discussed the patient's x-ray finding of thoracic compression fractures, and he now confirms that he has been on chronic steroids for years, denies any additional falls beyond those of the last 2 days.  I discussed the patient's wounds with her orthopedic colleagues. Indicate wound care nursing is appropriate for ongoing interventions for the wounds.  Given the unclear circumstances of syncope, though the patient does have multiple medical issues, and absence of prior cardiac evaluation, the patient will be admitted for further evaluation and management.  Final Clinical Impressions(s) / ED Diagnoses  Syncope Fall Skin tears, multiple   Carmin Muskrat, MD 11/02/17 2214

## 2017-11-02 NOTE — ED Triage Notes (Signed)
Pt reports 2 falls this weekend with multiple skin tears. Pt states he doesn't know why he fell and "didn't know it was going to happen until I was on the ground". SO states his color was very gray at the time of the fall. Multiple skin tears to bilateral arms and left leg.

## 2017-11-03 ENCOUNTER — Other Ambulatory Visit (HOSPITAL_COMMUNITY): Payer: Medicare Other

## 2017-11-03 DIAGNOSIS — I10 Essential (primary) hypertension: Secondary | ICD-10-CM

## 2017-11-03 DIAGNOSIS — J9611 Chronic respiratory failure with hypoxia: Secondary | ICD-10-CM | POA: Diagnosis not present

## 2017-11-03 DIAGNOSIS — R0902 Hypoxemia: Secondary | ICD-10-CM | POA: Diagnosis not present

## 2017-11-03 DIAGNOSIS — S22000A Wedge compression fracture of unspecified thoracic vertebra, initial encounter for closed fracture: Secondary | ICD-10-CM

## 2017-11-03 DIAGNOSIS — T148XXA Other injury of unspecified body region, initial encounter: Secondary | ICD-10-CM | POA: Diagnosis not present

## 2017-11-03 DIAGNOSIS — S51811A Laceration without foreign body of right forearm, initial encounter: Secondary | ICD-10-CM | POA: Diagnosis not present

## 2017-11-03 DIAGNOSIS — I872 Venous insufficiency (chronic) (peripheral): Secondary | ICD-10-CM | POA: Diagnosis not present

## 2017-11-03 DIAGNOSIS — R55 Syncope and collapse: Secondary | ICD-10-CM | POA: Diagnosis not present

## 2017-11-03 LAB — URINALYSIS, ROUTINE W REFLEX MICROSCOPIC
BACTERIA UA: NONE SEEN
Bilirubin Urine: NEGATIVE
Glucose, UA: NEGATIVE mg/dL
KETONES UR: NEGATIVE mg/dL
LEUKOCYTES UA: NEGATIVE
Nitrite: NEGATIVE
PROTEIN: NEGATIVE mg/dL
Specific Gravity, Urine: 1.018 (ref 1.005–1.030)
pH: 5 (ref 5.0–8.0)

## 2017-11-03 LAB — BASIC METABOLIC PANEL
Anion gap: 8 (ref 5–15)
Anion gap: 12 (ref 5–15)
BUN: 10 mg/dL (ref 6–20)
BUN: 10 mg/dL (ref 6–20)
CHLORIDE: 95 mmol/L — AB (ref 101–111)
CHLORIDE: 89 mmol/L — AB (ref 101–111)
CO2: 26 mmol/L (ref 22–32)
CO2: 26 mmol/L (ref 22–32)
Calcium: 8.4 mg/dL — ABNORMAL LOW (ref 8.9–10.3)
Calcium: 8.7 mg/dL — ABNORMAL LOW (ref 8.9–10.3)
Creatinine, Ser: 0.78 mg/dL (ref 0.61–1.24)
Creatinine, Ser: 0.83 mg/dL (ref 0.61–1.24)
GFR calc Af Amer: >60 mL/min (ref >60)
GFR calc Af Amer: >60 mL/min (ref >60)
GFR calc non Af Amer: >60 mL/min (ref >60)
GFR calc non Af Amer: >60 mL/min (ref >60)
GLUCOSE: 124 mg/dL — AB (ref 65–99)
Glucose, Bld: 125 mg/dL — ABNORMAL HIGH (ref 65–99)
POTASSIUM: 4 mmol/L (ref 3.5–5.1)
POTASSIUM: 4.1 mmol/L (ref 3.5–5.1)
SODIUM: 129 mmol/L — AB (ref 135–145)
Sodium: 127 mmol/L — ABNORMAL LOW (ref 135–145)

## 2017-11-03 LAB — CBC
HCT: 34.7 % — ABNORMAL LOW (ref 39.0–52.0)
HEMOGLOBIN: 11.7 g/dL — AB (ref 13.0–17.0)
MCH: 31.5 pg (ref 26.0–34.0)
MCHC: 33.7 g/dL (ref 30.0–36.0)
MCV: 93.5 fL (ref 78.0–100.0)
Platelets: 214 10*3/uL (ref 150–400)
RBC: 3.71 MIL/uL — ABNORMAL LOW (ref 4.22–5.81)
RDW: 13.3 % (ref 11.5–15.5)
WBC: 8.9 10*3/uL (ref 4.0–10.5)

## 2017-11-03 LAB — CBG MONITORING, ED: Glucose-Capillary: 134 mg/dL — ABNORMAL HIGH (ref 65–99)

## 2017-11-03 MED ORDER — HYDROMORPHONE HCL 2 MG/ML IJ SOLN
1.0000 mg | Freq: Once | INTRAMUSCULAR | Status: AC
  Administered 2017-11-03: 1 mg via INTRAVENOUS
  Filled 2017-11-03: qty 1

## 2017-11-03 NOTE — ED Notes (Signed)
Wound care nurse at bedside dressing wounds.

## 2017-11-03 NOTE — Progress Notes (Signed)
PT Cancellation Note  Patient Details Name: Francisco [name] MRN: [id number] DOB: January 20, 1946   Cancelled Treatment:    Reason Eval/Treat Not Completed: Other (comment) Pt attempting to use [facility] and reports he will be there awhile longer. Both pt and family requesting to hold PT until tomorrow as well, as pt supposed to have echo done today and other possible tests. Will follow up as schedule allows.   Leighton Ruff, PT, DPT  Acute Rehabilitation Services  Pager: [phone]    Rudean Hitt 11/03/2017, 12:26 PM

## 2017-11-03 NOTE — Consult Note (Signed)
[place] Nurse wound consult note Reason for Consult:traumatic skin injury to right forearm, left antecubital area, left hand and left knee. Venous insufficiency to left LE with weeping dermatitis.. Wound type: trauma, venous insifficiency Pressure Injury POA: NA Measurement: RFA:  15cm x 5cm x 1cm (see photo in record) Left antecubital:  3cm x 4cm x 0.1cm Left hand:  Area involved measures 6cm x 4cm with three skin tears measuring 0.1cm deep Left knee: 8cm x 4cm x 0.1cm with two areas of skin tears. Left LE with hemosiderin staining, chronic venous insufficiency and pinpoint areas that are weeping serous fluid. Wound bed:red, moist Drainage (amount, consistency, odor) small to moderate amount of serosanguinous drainage.LLE is weeping serous fluid in moderate amount. Periwound: numerous areas of ecchymosis; thin, dry skin Dressing procedure/placement/frequency: All areas are cleansed with NS, this will continue twice daily with redressing. I have discussed with Ambrose Finland, Ortho PA and we are in agreement that a nonadherent, antimicrobial dressing (xeroform) is appropriate at this time.  Additionally, I will provide guidance via the Orders for care of the LLE for mild compression.  Patient will benefit from follow up by the outpatient [facility] of his choosing and also from [facility].  If you agree, please order/arrange. Downstream, plastic consult may be indicated.  [place] nursing team will not follow, but will remain available to this patient, the nursing and medical teams.  Please re-consult if needed. Thanks, Maudie Flakes, MSN, RN, [place], Ar[facility]  Pager# [phone]

## 2017-11-03 NOTE — Consult Note (Signed)
Reason for Consult:Forearm laceration Referring Physician: [name] is an 72 y.o. male.  HPI: Francisco [name] fell on Friday and suffered some abrasions to his right forearm. He fell again on Sunday and hit the same arm on a table and caused a large laceration. He came to the ED for evaluation and hand surgery was consulted. He is RHD.  Past Medical History:  Diagnosis Date  . Chest x-ray abnormality   . Chronic rhinitis    -Sinus Ct 08/01/2009 >> Bilateral maxillary sinusitis with some mucosal thickeningin the sphenoid and frontal sinuses as well with air fluid levels present -chronic rhinitis flyer Aug 04, 2009  . COPD (chronic obstructive pulmonary disease) [facility])    PFT's rec Jul 17, 2009  . Onychomycosis    Dr. Judi Cong    Past Surgical History:  Procedure Laterality Date  . APPENDECTOMY    . TONSILLECTOMY      Family History  Problem Relation Age of Onset  . Heart disease Mother        CABG in her 43s, nonsmoker  . Cancer Mother   . Stroke Father   . Heart disease Father        Died of MI at age 15, smoker  . Hepatitis Sister   . Coronary artery disease Other        male 1st degree relative <60    Social History:  reports that he has been smoking pipe and cigarettes.  He has a 20.00 pack-year smoking history. He has never used smokeless tobacco. He reports that he drinks about 10.8 oz of alcohol per week. He reports that he does not use drugs.  Allergies:  Allergies  Allergen Reactions  . Tape Other (See Comments)    SKIN IS VERY THIN; CAN ONLY USE COBAN WRAPS DUE TO CONDITION OF SKIN!!    Medications: I have reviewed the patient's current medications.  Results for orders placed or performed during the hospital encounter of 11/02/17 (from the past 48 hour(s))  I-Stat Troponin, ED - 0, 3, 6 hours (not at [facility])     Status: None   Collection Time: 11/02/17  7:53 PM  Result Value Ref Range   Troponin i, poc 0.01 0.00 - 0.08 ng/mL   Comment 3             Comment: Due to the release kinetics of cTnI, a negative result within the first hours of the onset of symptoms does not rule out myocardial infarction with certainty. If myocardial infarction is still suspected, repeat the test at appropriate intervals.   Lipase, blood     Status: None   Collection Time: 11/02/17  7:57 PM  Result Value Ref Range   Lipase 28 11 - 51 U/L    Comment: Performed at [facility], [place] [address], [place], [place] [address]  Magnesium     Status: None   Collection Time: 11/02/17  7:57 PM  Result Value Ref Range   Magnesium 1.9 1.7 - 2.4 mg/dL    Comment: Performed at [facility], [place] [address] [place], [place] [address]  Brain natriuretic peptide     Status: None   Collection Time: 11/02/17  7:57 PM  Result Value Ref Range   B Natriuretic Peptide 83.9 0.0 - 100.0 pg/mL    Comment: Performed at [facility] [address], [place], [place] [address]  CBC     Status: Abnormal   Collection Time: 11/02/17  7:57 PM  Result Value Ref Range   WBC 11.3 (H) 4.0 - 10.5 K/uL   RBC 3.97 (L) 4.22 - 5.81 MIL/uL   Hemoglobin 12.8 (L) 13.0 - 17.0 g/dL   HCT 37.1 (L) 39.0 - 52.0 %   MCV 93.5 78.0 - 100.0 fL   MCH 32.2 26.0 - 34.0 pg   MCHC 34.5 30.0 - 36.0 g/dL   RDW 13.5 11.5 - 15.5 %   Platelets 243 150 - 400 K/uL    Comment: Performed at [facility] [address], [place], [place] [address]  Hepatic function panel     Status: Abnormal   Collection Time: 11/02/17  7:57 PM  Result Value Ref Range   Total Protein 6.5 6.5 - 8.1 g/dL   Albumin 3.3 (L) 3.5 - 5.0 g/dL   AST 24 15 - 41 U/L   ALT 17 17 - 63 U/L   Alkaline Phosphatase 86 38 - 126 U/L   Total Bilirubin 0.9 0.3 - 1.2 mg/dL   Bilirubin, Direct 0.1 0.1 - 0.5 mg/dL   Indirect Bilirubin 0.8 0.3 - 0.9 mg/dL    Comment: Performed at [facility] [address] [place], [place] [address]  Basic metabolic panel     Status: Abnormal   Collection Time: 11/03/17  1:14 AM   Result Value Ref Range   Sodium 129 (L) 135 - 145 mmol/L   Potassium 4.0 3.5 - 5.1 mmol/L   Chloride 95 (L) 101 - 111 mmol/L   CO2 26 22 - 32 mmol/L   Glucose, Bld 125 (H) 65 - 99 mg/dL   BUN 10 6 - 20 mg/dL   Creatinine, Ser 0.78 0.61 - 1.24 mg/dL   Calcium 8.4 (L) 8.9 - 10.3 mg/dL   GFR calc non Af Amer >60 >60 mL/min   GFR calc Af Amer >60 >60 mL/min    Comment: (NOTE) The eGFR has been calculated using the CKD EPI equation. This calculation has not been validated in all clinical situations. eGFR's persistently <60 mL/min signify possible Chronic Kidney Disease.    Anion gap 8 5 - 15    Comment: Performed at [facility] [address], [place], [place] [address]    Dg Chest 2 View  Result Date: 11/02/2017 CLINICAL DATA:  Pt reports 2 falls this weekend with multiple skin tears. Pt states he doesn't know why he fell. Pt having left lower lateral rib pain. EXAM: CHEST - 2 VIEW COMPARISON:  10/12/2013.  Chest CT, 07/17/2017. FINDINGS: Cardiac silhouette is normal in size and configuration. No mediastinal or hilar masses. Lungs are hyperexpanded. There is stable scarring at the right apex. No evidence of pneumonia or pulmonary edema. No pleural effusion or pneumothorax. There are 3 contiguous compression fractures of the midthoracic spine, T6, T7 and T8, mild of T6 and moderate of T7 and T8. The T7 fracture was present on the prior CT. The other 2 fractures are new since that exam. No other fractures.  Skeletal structures are demineralized. IMPRESSION: 1. No acute cardiopulmonary disease. 2. COPD.  Chronic right apical lung scarring. 3. Fractures of T6, T7 and T8. T7 was old. The T6 and T8 fractures are new since the CT dated 07/17/2017. Electronically Signed   By: Lajean Manes M.D.   On: 11/02/2017 19:25   Ct Head Wo Contrast  Result Date: 11/02/2017 CLINICAL DATA:  Patient states two falls this weekend with multiple skin tears. Pt states he doesn't know why he  fell and "didn't  know it was going to happen until I was on the ground". Does not take blood thinners. EXAM: CT HEAD WITHOUT CONTRAST TECHNIQUE: Contiguous axial images were obtained from the base of the skull through the vertex without intravenous contrast. COMPARISON:  None. FINDINGS: Brain: No evidence of acute infarction, hemorrhage, hydrocephalus, extra-axial collection or mass lesion/mass effect. Minor periventricular white matter hypoattenuation consistent with chronic microvascular ischemic change. Vascular: No hyperdense vessel or unexpected calcification. Skull: Normal. Negative for fracture or focal lesion. Sinuses/Orbits: Globes and orbits are unremarkable. Small mucous retention cyst in the right sphenoid sinus and right maxillary sinus. Clear mastoid air cells. Other: None. IMPRESSION: 1. No acute intracranial abnormalities. 2. Minor chronic microvascular ischemic change. Electronically Signed   By: Lajean Manes M.D.   On: 11/02/2017 19:21    Review of Systems  Constitutional: Negative for weight loss.  HENT: Negative for ear discharge, ear pain, hearing loss and tinnitus.   Eyes: Negative for blurred vision, double vision, photophobia and pain.  Respiratory: Negative for cough, sputum production and shortness of breath.   Cardiovascular: Negative for chest pain.  Gastrointestinal: Negative for abdominal pain, nausea and vomiting.  Genitourinary: Negative for dysuria, flank pain, frequency and urgency.  Musculoskeletal: Positive for falls and joint pain (Right forearm). Negative for back pain, myalgias and neck pain.  Neurological: Negative for dizziness, tingling, sensory change, focal weakness, loss of consciousness and headaches.  Endo/Heme/Allergies: Does not bruise/bleed easily.  Psychiatric/Behavioral: Negative for depression, memory loss and substance abuse. The patient is not nervous/anxious.    Blood pressure (!) 152/77, pulse (!) 105, temperature 98.2 F (36.8 C), temperature source Oral,  resp. rate 18, SpO2 98 %. Physical Exam  Constitutional: He appears well-developed and well-nourished. No distress.  HENT:  Head: Normocephalic and atraumatic.  Eyes: Conjunctivae are normal. Right eye exhibits no discharge. Left eye exhibits no discharge. No scleral icterus.  Neck: Normal range of motion.  Cardiovascular: Normal rate and regular rhythm.  Respiratory: Effort normal. No respiratory distress.  Musculoskeletal:  Right shoulder, elbow, wrist, digits- Full thickness longitudinal laceration dorsal forearm, no instability, no blocks to motion  Sens  Ax/R/M/U intact  Mot   Ax/ R/ PIN/ M/ AIN/ U intact  Rad 2+  Neurological: He is alert.  Skin: Skin is warm and dry. He is not diaphoretic.  Psychiatric: He has a normal mood and affect. His behavior is normal.    Assessment/Plan: Right forearm laceration -- I believe the skin edges are retracted to such a degree that primary repair would put too much tension on the wound edges. Therefore recommend closure by secondary intention under the direction of WOC RN. Suspect pt will need treatment at the [place] and, if further intervention is necessary, will likely need referral to plastics. Hand surgery available for questions but he should not need f/u with [place]. Multiple falls -- Needs intervention to prevent further issues Multiple medical problems -- per primary service    Lisette Abu, PA-C Orthopedic Surgery [phone] 11/03/2017, 8:25 AM

## 2017-11-03 NOTE — ED Notes (Signed)
Wound RN at bedside. 

## 2017-11-03 NOTE — Progress Notes (Signed)
PROGRESS NOTE    Francisco [name]  SWN:[id number] DOB: 08-Dec-1945 DOA: 11/02/2017 PCP: [name], MD   Outpatient Specialists:     Brief Narrative:  Francisco [name] is a 72 y.o. 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  Patient reports that he been in his usual state of health until 3 days ago when he was ambulating through his house and ended up on the ground.  There was no prodrome and he returned to his usual state rapidly.  He had a another episode today.  He denies hitting his head but his family believes that he did.  Denies headache, change in vision or hearing, or focal numbness or weakness.  He suffered some skin tears and a right dorsal forearm laceration with a fall.  He denies experiencing any chest pain or palpitations.  He has chronic bilateral lower extremity edema but denies tenderness.  No recent fevers or chills.  Respiratory status has been stable.     Assessment & Plan:   Principal Problem:   Syncope Active Problems:   COPD (chronic obstructive pulmonary disease) with emphysema (HCC)   Chronic respiratory failure with hypoxia (HCC)   Thoracic compression fracture (HCC)   Essential hypertension   Venous stasis dermatitis of both lower extremities   Forearm laceration, right, initial encounter   Syncope  - Presents following 2 syncopal episodes in past 3 days without prodrome  - He is slightly tachycardic and PE considered, but has chronic tachycardia based on office visit vitals and there is no other suggestion of VTE  - No arrhythmia on EKG or monitor  - Continue cardiac monitoring -orthostatics NEVER done-- now s/p 1L IVF -echo pending  Steroid-dependent COPD; chronic hypoxic respiratory failure   - Stable  - Continue ICS/LABA, Spiriva, prn albuterol, daily prednisone,  supplemental O2   per pulm   Forearm laceration  - Patient suffered laceration to dorsal right forearm with the syncopal episode  - Hand surgery was consulted by EDP and recommended wound-care RN eval  - Continue wound care-- will need wound care at home and ? Plastics through doubt any intervention would help as chronically on steroids and skin very thin  Venous stasis dermatitis  - Bilateral stasis dermatitis with serous weeping   - Elevate legs, consult with wound-care RN Not compliant at home with elevation nor stockings  Hypertension  - Managed at home with triamterene-HCTZ, held on admission in light of hyponatremia    -may need BB vs cardiazem for BP and HR improvement  Thoracic compression fractures  - T6, T7, and T8 fractures noted on CXR; T6 and T8 fxs new since January  - Patient does complain of back pain  - Pain-control, PT eval    Hyponatremia  - Serum sodium trending down - Appears to be chronic; doubt related to syncope  - Received 1 liter NS in ED  - may need further work up-- did not improved with IVF -recheck in AM      DVT prophylaxis:  SCD's  Code Status: Full Code   Family Communication: At bedside  Disposition Plan:  pending   Consultants:   Ortho  WOC RN   Subjective: Leaves legs dangling and does not wear compression stockings  Objective: Vitals:   11/03/17 0845 11/03/17 0915 11/03/17 0930 11/03/17 1010  BP: (!) 159/83   [name] Kitchen)  151/101  Pulse: (!) 102 (!) 110 (!) 113 (!) 104  Resp: 16  17 14   Temp:      TempSrc:      SpO2: 98% 97% 96% 98%    Intake/Output Summary (Last 24 hours) at 11/03/2017 1351 Last data filed at 11/03/2017 1010 Gross per 24 hour  Intake 1006 ml  Output -  Net 1006 ml   There were no vitals filed for this visit.  Examination:  General exam: Appears calm and comfortable  Respiratory system: diminished, no increased work of breathing Cardiovascular system: S1 & S2 heard, RRR. No JVD, murmurs,  rubs, gallops or clicks. No pedal edema. Gastrointestinal system: Abdomen is nondistended, soft and nontender. No organomegaly or masses felt. Normal bowel sounds heard. Central nervous system: Alert and oriented. No focal neurological deficits. Extremities: lower extremities with chronic changes and swelling Skin:multiple areas of skin tears and bruising-- all skin tears wrapped by [place] RN Psychiatry: Judgement and insight appear normal. Mood & affect appropriate.     Data Reviewed: I have personally reviewed following labs and imaging studies  CBC: Recent Labs  Lab 11/02/17 1957 11/03/17 0940  WBC 11.3* 8.9  HGB 12.8* 11.7*  HCT 37.1* 34.7*  MCV 93.5 93.5  PLT 243 948   Basic Metabolic Panel: Recent Labs  Lab 11/02/17 1957 11/03/17 0114 11/03/17 0940  NA  --  129* 127*  K  --  4.0 4.1  CL  --  95* 89*  CO2  --  26 26  GLUCOSE  --  125* 124*  BUN  --  10 10  CREATININE  --  0.78 0.83  CALCIUM  --  8.4* 8.7*  MG 1.9  --   --    GFR: CrCl cannot be calculated (Unknown ideal weight.). Liver Function Tests: Recent Labs  Lab 11/02/17 1957  AST 24  ALT 17  ALKPHOS 86  BILITOT 0.9  PROT 6.5  ALBUMIN 3.3*   Recent Labs  Lab 11/02/17 1957  LIPASE 28   No results for input(s): AMMONIA in the last 168 hours. Coagulation Profile: No results for input(s): INR, PROTIME in the last 168 hours. Cardiac Enzymes: No results for input(s): CKTOTAL, CKMB, CKMBINDEX, TROPONINI in the last 168 hours. BNP (last 3 results) No results for input(s): PROBNP in the last 8760 hours. HbA1C: No results for input(s): HGBA1C in the last 72 hours. CBG: Recent Labs  Lab 11/03/17 0930  GLUCAP 134*   Lipid Profile: No results for input(s): CHOL, HDL, LDLCALC, TRIG, CHOLHDL, LDLDIRECT in the last 72 hours. Thyroid Function Tests: No results for input(s): TSH, T4TOTAL, FREET4, T3FREE, THYROIDAB in the last 72 hours. Anemia Panel: No results for input(s): VITAMINB12, FOLATE,  FERRITIN, TIBC, IRON, RETICCTPCT in the last 72 hours. Urine analysis:    Component Value Date/Time   COLORURINE YELLOW 06/25/2017 0831   APPEARANCEUR CLEAR 06/25/2017 0831   LABSPEC 1.010 06/25/2017 0831   PHURINE 6.0 06/25/2017 0831   GLUCOSEU NEGATIVE 06/25/2017 0831   HGBUR SMALL (A) 06/25/2017 0831   HGBUR trace-lysed 02/12/2010 0819   BILIRUBINUR NEGATIVE 06/25/2017 0831   BILIRUBINUR neg 06/05/2016 1117   KETONESUR NEGATIVE 06/25/2017 0831   PROTEINUR neg 06/05/2016 1117   UROBILINOGEN 0.2 06/25/2017 0831   NITRITE NEGATIVE 06/25/2017 0831   LEUKOCYTESUR NEGATIVE 06/25/2017 0831     )No results found for this or any previous visit (from the past 240 hour(s)).    Anti-infectives (From admission, onward)   Start     Dose/Rate  Route Frequency Ordered Stop   11/03/17 1000  azithromycin (ZITHROMAX) tablet 250 mg     250 mg Oral Every M-W-F 11/02/17 2302         Radiology Studies: Dg Chest 2 View  Result Date: 11/02/2017 CLINICAL DATA:  Pt reports 2 falls this weekend with multiple skin tears. Pt states he doesn't know why he fell. Pt having left lower lateral rib pain. EXAM: CHEST - 2 VIEW COMPARISON:  10/12/2013.  Chest CT, 07/17/2017. FINDINGS: Cardiac silhouette is normal in size and configuration. No mediastinal or hilar masses. Lungs are hyperexpanded. There is stable scarring at the right apex. No evidence of pneumonia or pulmonary edema. No pleural effusion or pneumothorax. There are 3 contiguous compression fractures of the midthoracic spine, T6, T7 and T8, mild of T6 and moderate of T7 and T8. The T7 fracture was present on the prior CT. The other 2 fractures are new since that exam. No other fractures.  Skeletal structures are demineralized. IMPRESSION: 1. No acute cardiopulmonary disease. 2. COPD.  Chronic right apical lung scarring. 3. Fractures of T6, T7 and T8. T7 was old. The T6 and T8 fractures are new since the CT dated 07/17/2017. Electronically Signed   By:  Lajean Manes M.D.   On: 11/02/2017 19:25   Ct Head Wo Contrast  Result Date: 11/02/2017 CLINICAL DATA:  Patient states two falls this weekend with multiple skin tears. Pt states he doesn't know why he fell and "didn't know it was going to happen until I was on the ground". Does not take blood thinners. EXAM: CT HEAD WITHOUT CONTRAST TECHNIQUE: Contiguous axial images were obtained from the base of the skull through the vertex without intravenous contrast. COMPARISON:  None. FINDINGS: Brain: No evidence of acute infarction, hemorrhage, hydrocephalus, extra-axial collection or mass lesion/mass effect. Minor periventricular white matter hypoattenuation consistent with chronic microvascular ischemic change. Vascular: No hyperdense vessel or unexpected calcification. Skull: Normal. Negative for fracture or focal lesion. Sinuses/Orbits: Globes and orbits are unremarkable. Small mucous retention cyst in the right sphenoid sinus and right maxillary sinus. Clear mastoid air cells. Other: None. IMPRESSION: 1. No acute intracranial abnormalities. 2. Minor chronic microvascular ischemic change. Electronically Signed   By: Lajean Manes M.D.   On: 11/02/2017 19:21        Scheduled Meds: . azithromycin  250 mg Oral Q M,W,F  . fluticasone  2 spray Each Nare Daily  . mometasone-formoterol  2 puff Inhalation BID  . montelukast  10 mg Oral QHS  . predniSONE  10 mg Oral Q breakfast  . sodium chloride flush  3 mL Intravenous Q12H  . sodium chloride flush  3 mL Intravenous Q12H  . tiotropium  18 mcg Inhalation Daily   Continuous Infusions: . sodium chloride       LOS: 0 days    Time spent: 25 min    Geradine Girt, DO [facility] Pager [phone]  If 7PM-7AM, please contact night-coverage [email or web address] Password [email or web address] 11/03/2017, 1:51 PM

## 2017-11-04 ENCOUNTER — Observation Stay (HOSPITAL_BASED_OUTPATIENT_CLINIC_OR_DEPARTMENT_OTHER): Payer: Medicare Other

## 2017-11-04 DIAGNOSIS — R55 Syncope and collapse: Secondary | ICD-10-CM

## 2017-11-04 DIAGNOSIS — J9611 Chronic respiratory failure with hypoxia: Secondary | ICD-10-CM | POA: Diagnosis not present

## 2017-11-04 DIAGNOSIS — I10 Essential (primary) hypertension: Secondary | ICD-10-CM | POA: Diagnosis not present

## 2017-11-04 LAB — ECHOCARDIOGRAM COMPLETE
HEIGHTINCHES: 72 in
Weight: 2364.8 oz

## 2017-11-04 LAB — CBC
HCT: 34.4 % — ABNORMAL LOW (ref 39.0–52.0)
HEMOGLOBIN: 11.6 g/dL — AB (ref 13.0–17.0)
MCH: 31.1 pg (ref 26.0–34.0)
MCHC: 33.7 g/dL (ref 30.0–36.0)
MCV: 92.2 fL (ref 78.0–100.0)
Platelets: 213 10*3/uL (ref 150–400)
RBC: 3.73 MIL/uL — AB (ref 4.22–5.81)
RDW: 13.2 % (ref 11.5–15.5)
WBC: 9.7 10*3/uL (ref 4.0–10.5)

## 2017-11-04 LAB — BASIC METABOLIC PANEL
ANION GAP: 11 (ref 5–15)
BUN: 8 mg/dL (ref 6–20)
CHLORIDE: 92 mmol/L — AB (ref 101–111)
CO2: 27 mmol/L (ref 22–32)
Calcium: 9.1 mg/dL (ref 8.9–10.3)
Creatinine, Ser: 0.8 mg/dL (ref 0.61–1.24)
GFR calc Af Amer: >60 mL/min (ref >60)
GFR calc non Af Amer: >60 mL/min (ref >60)
Glucose, Bld: 90 mg/dL (ref 65–99)
POTASSIUM: 4.4 mmol/L (ref 3.5–5.1)
SODIUM: 130 mmol/L — AB (ref 135–145)

## 2017-11-04 LAB — GLUCOSE, CAPILLARY: GLUCOSE-CAPILLARY: 112 mg/dL — AB (ref 65–99)

## 2017-11-04 MED ORDER — PERFLUTREN LIPID MICROSPHERE
1.0000 mL | INTRAVENOUS | Status: AC | PRN
  Filled 2017-11-04: qty 10

## 2017-11-04 MED ORDER — PERFLUTREN LIPID MICROSPHERE
INTRAVENOUS | Status: AC
  Administered 2017-11-04: 13:00:00
  Filled 2017-11-04: qty 10

## 2017-11-04 NOTE — Plan of Care (Signed)
  Problem: Skin Integrity: Goal: Risk for impaired skin integrity will decrease Outcome: Progressing Note:  Wound care consulted; dressings done per order.

## 2017-11-04 NOTE — Care Management Note (Addendum)
Case Management Note  Patient Details  Name: Francisco [name] MRN: [id number] Date of Birth: 10/26/1945  Subjective/Objective: Pt presented for Syncope- falls. PTA from home with support of wife. Pt has 02 2 L with [facility]. VQM:[id number] for [employer] with Seat. CM did discuss disposition needs with patient. Patient wants wife to be present to make choice for [facility] (leaning towards [facility] since has DME with them).                   Action/Plan: CM will speak with patient and wife in am in regards to agency of choice. No further needs from CM at this time.   Expected Discharge Date:                  Expected Discharge Plan:  [place]  In-House Referral:  NA  Discharge planning Services  CM Consult  Post Acute Care Choice:  Durable Medical Equipment, Home Health Choice offered to:  Patient, Spouse  DME Arranged:  [place] rolling with seat DME Agency: [facility]    HH Arranged:  RN, PT HH Agency:   [facility]  Status of Service: Completed  If discussed at [place] of Stay Meetings, dates discussed:    Additional Comments: 1316 11-05-17 Jacqlyn Krauss, RN,BSN [phone] CM did speak with patient, wife and daughter in regards to disposition needs. Wife chose [facility] for [facility] 2/2 they have DME 02 with them. Referral made to [facility] with [facility] and SOC to begin within 24-48 hours post d/c. DME 02 with increased liter flow and Rollator was ordered. DME to be delivered to room prior to d/c. MD asked wound care RN to come to floor to demonstrate how to change dressing on arm. CM did call [place] for Xeroform Dressings- order is needed for this and MD is aware. Pt's wife did call to get price check. Daughter is aware that she can look on [employer] to see if she can get cheaper as well. CM also discussed Pulse Oximetry and where to purchase. No further needs from CM at this time.  Bethena Roys, RN 11/04/2017, 2:19  PM

## 2017-11-04 NOTE — Progress Notes (Addendum)
PROGRESS NOTE    Francisco [name]  XFG:[id number] DOB: 1946/01/22 DOA: 11/02/2017 PCP: [name], MD   Outpatient Specialists:     Brief Narrative:  Francisco [name] is a 72 y.o. 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  Patient reports that he been in his usual state of health until 3 days ago when he was ambulating through his house and ended up on the ground.  There was no prodrome and he returned to his usual state rapidly.  He had a another episode today.  He denies hitting his head but his family believes that he did.  Denies headache, change in vision or hearing, or focal numbness or weakness.  He suffered some skin tears and a right dorsal forearm laceration with a fall.  He denies experiencing any chest pain or palpitations.  He has chronic bilateral lower extremity edema but denies tenderness.  No recent fevers or chills.  Respiratory status has been stable.     Assessment & Plan:   Principal Problem:   Syncope Active Problems:   COPD (chronic obstructive pulmonary disease) with emphysema (HCC)   Chronic respiratory failure with hypoxia (HCC)   Thoracic compression fracture (HCC)   Essential hypertension   Venous stasis dermatitis of both lower extremities   Forearm laceration, right, initial encounter   Syncope  - Presents following 2 syncopal episodes in past 3 days (all involved being off his O2 so suspect hypoxia) - He is slightly tachycardic and PE considered, but has chronic tachycardia based on office visit vitals and there is no other suggestion of VTE  - No arrhythmia on EKG or monitor  -orthostatics NEVER done-- now s/p 1L IVF -echo w/o cause  Steroid-dependent COPD; chronic hypoxic respiratory failure   - Stable  - Continue ICS/LABA, Spiriva, prn albuterol, daily prednisone,  supplemental O2   per pulm   Forearm laceration  - Patient suffered laceration to dorsal right forearm with the syncopal episode  - Hand surgery was consulted by EDP and recommended wound-care RN eval  - Continue wound care-- will need wound care at home and ? Plastics through doubt any intervention would help as chronically on steroids and skin very thin -nursing to help wife learn to change bandages -home health also ordered  Venous stasis dermatitis  - Bilateral stasis dermatitis with serous weeping   - Elevate legs, consult with wound-care RN Not compliant at home with elevation nor stockings  Hypertension  - Managed at home with triamterene-HCTZ, held on admission in light of hyponatremia    -may need BB vs cardiazem for BP and HR improvement  Thoracic compression fractures  - T6, T7, and T8 fractures noted on CXR; T6 and T8 fxs new since January  - Patient does complain of back pain  - Pain-control, PT eval    Hyponatremia  - responding off of HCTZ      DVT prophylaxis:  SCD's  Code Status: Full Code   Family Communication: None at bedside  Disposition Plan:  Home in the AM after wound care finalized   Consultants:   Ortho  WOC RN   Subjective: Feeling better-- worried about wound care  Objective: Vitals:   11/03/17 2011 11/03/17 2345 11/04/17 0537 11/04/17 1517  BP: (!) 146/89 (!) 181/94 140/83 (!) 158/98  Pulse: 99 (!) 111 96 98  Resp: 16  13 18 18   Temp: 97.6 F (36.4 C) 98.2 F (36.8 C) 97.8 F (36.6 C)   TempSrc: Oral Oral Oral Oral  SpO2: 99% 90% 97% 94%  Weight:   67 kg (147 lb 12.8 oz)   Height:        Intake/Output Summary (Last 24 hours) at 11/04/2017 1547 Last data filed at 11/04/2017 1200 Gross per 24 hour  Intake 840 ml  Output 3150 ml  Net -2310 ml   Filed Weights   11/03/17 1611 11/04/17 0537  Weight: 68.5 kg (151 lb) 67 kg (147 lb 12.8 oz)    Examination:  General exam: in bed, NAD Respiratory system: no  increased work of breathing, no wheezing Cardiovascular system: slightly fast Gastrointestinal system: +BS, soft Central nervous system: A+O Extremities: chronic skin changes with weeping in lower extremitites Skin: multiple areas of skin wrapped     Data Reviewed: I have personally reviewed following labs and imaging studies  CBC: Recent Labs  Lab 11/02/17 1957 11/03/17 0940 11/04/17 0449  WBC 11.3* 8.9 9.7  HGB 12.8* 11.7* 11.6*  HCT 37.1* 34.7* 34.4*  MCV 93.5 93.5 92.2  PLT 243 214 098   Basic Metabolic Panel: Recent Labs  Lab 11/02/17 1957 11/03/17 0114 11/03/17 0940 11/04/17 0449  NA  --  129* 127* 130*  K  --  4.0 4.1 4.4  CL  --  95* 89* 92*  CO2  --  26 26 27   GLUCOSE  --  125* 124* 90  BUN  --  10 10 8   CREATININE  --  0.78 0.83 0.80  CALCIUM  --  8.4* 8.7* 9.1  MG 1.9  --   --   --    GFR: Estimated Creatinine Clearance: 81.4 mL/min (by C-G formula based on SCr of 0.8 mg/dL). Liver Function Tests: Recent Labs  Lab 11/02/17 1957  AST 24  ALT 17  ALKPHOS 86  BILITOT 0.9  PROT 6.5  ALBUMIN 3.3*   Recent Labs  Lab 11/02/17 1957  LIPASE 28   No results for input(s): AMMONIA in the last 168 hours. Coagulation Profile: No results for input(s): INR, PROTIME in the last 168 hours. Cardiac Enzymes: No results for input(s): CKTOTAL, CKMB, CKMBINDEX, TROPONINI in the last 168 hours. BNP (last 3 results) No results for input(s): PROBNP in the last 8760 hours. HbA1C: No results for input(s): HGBA1C in the last 72 hours. CBG: Recent Labs  Lab 11/03/17 0930 11/04/17 0532  GLUCAP 134* 112*   Lipid Profile: No results for input(s): CHOL, HDL, LDLCALC, TRIG, CHOLHDL, LDLDIRECT in the last 72 hours. Thyroid Function Tests: No results for input(s): TSH, T4TOTAL, FREET4, T3FREE, THYROIDAB in the last 72 hours. Anemia Panel: No results for input(s): VITAMINB12, FOLATE, FERRITIN, TIBC, IRON, RETICCTPCT in the last 72 hours. Urine analysis:      Component Value Date/Time   COLORURINE YELLOW 11/03/2017 1550   APPEARANCEUR CLOUDY (A) 11/03/2017 1550   LABSPEC 1.018 11/03/2017 1550   PHURINE 5.0 11/03/2017 1550   GLUCOSEU NEGATIVE 11/03/2017 1550   GLUCOSEU NEGATIVE 06/25/2017 0831   HGBUR MODERATE (A) 11/03/2017 1550   HGBUR trace-lysed 02/12/2010 0819   BILIRUBINUR NEGATIVE 11/03/2017 1550   BILIRUBINUR neg 06/05/2016 1117   KETONESUR NEGATIVE 11/03/2017 1550   PROTEINUR NEGATIVE 11/03/2017 1550   UROBILINOGEN 0.2 06/25/2017 0831   NITRITE NEGATIVE 11/03/2017 1550   LEUKOCYTESUR NEGATIVE 11/03/2017 1550     )No results found for this or any previous visit (from the past 240 hour(s)).  Anti-infectives (From admission, onward)   Start     Dose/Rate Route Frequency Ordered Stop   11/03/17 1000  azithromycin (ZITHROMAX) tablet 250 mg     250 mg Oral Every M-W-F 11/02/17 2302         Radiology Studies: Dg Chest 2 View  Result Date: 11/02/2017 CLINICAL DATA:  Pt reports 2 falls this weekend with multiple skin tears. Pt states he doesn't know why he fell. Pt having left lower lateral rib pain. EXAM: CHEST - 2 VIEW COMPARISON:  10/12/2013.  Chest CT, 07/17/2017. FINDINGS: Cardiac silhouette is normal in size and configuration. No mediastinal or hilar masses. Lungs are hyperexpanded. There is stable scarring at the right apex. No evidence of pneumonia or pulmonary edema. No pleural effusion or pneumothorax. There are 3 contiguous compression fractures of the midthoracic spine, T6, T7 and T8, mild of T6 and moderate of T7 and T8. The T7 fracture was present on the prior CT. The other 2 fractures are new since that exam. No other fractures.  Skeletal structures are demineralized. IMPRESSION: 1. No acute cardiopulmonary disease. 2. COPD.  Chronic right apical lung scarring. 3. Fractures of T6, T7 and T8. T7 was old. The T6 and T8 fractures are new since the CT dated 07/17/2017. Electronically Signed   By: Lajean Manes M.D.   On:  11/02/2017 19:25   Ct Head Wo Contrast  Result Date: 11/02/2017 CLINICAL DATA:  Patient states two falls this weekend with multiple skin tears. Pt states he doesn't know why he fell and "didn't know it was going to happen until I was on the ground". Does not take blood thinners. EXAM: CT HEAD WITHOUT CONTRAST TECHNIQUE: Contiguous axial images were obtained from the base of the skull through the vertex without intravenous contrast. COMPARISON:  None. FINDINGS: Brain: No evidence of acute infarction, hemorrhage, hydrocephalus, extra-axial collection or mass lesion/mass effect. Minor periventricular white matter hypoattenuation consistent with chronic microvascular ischemic change. Vascular: No hyperdense vessel or unexpected calcification. Skull: Normal. Negative for fracture or focal lesion. Sinuses/Orbits: Globes and orbits are unremarkable. Small mucous retention cyst in the right sphenoid sinus and right maxillary sinus. Clear mastoid air cells. Other: None. IMPRESSION: 1. No acute intracranial abnormalities. 2. Minor chronic microvascular ischemic change. Electronically Signed   By: Lajean Manes M.D.   On: 11/02/2017 19:21        Scheduled Meds: . azithromycin  250 mg Oral Q M,W,F  . fluticasone  2 spray Each Nare Daily  . mometasone-formoterol  2 puff Inhalation BID  . montelukast  10 mg Oral QHS  . predniSONE  10 mg Oral Q breakfast  . sodium chloride flush  3 mL Intravenous Q12H  . sodium chloride flush  3 mL Intravenous Q12H  . tiotropium  18 mcg Inhalation Daily   Continuous Infusions: . sodium chloride       LOS: 0 days    Time spent: 25 min    Geradine Girt, DO [facility] Pager [phone]  If 7PM-7AM, please contact night-coverage [email or web address] Password [facility] 11/04/2017, 3:47 PM

## 2017-11-04 NOTE — Progress Notes (Signed)
  Echocardiogram 2D Echocardiogram has been performed.  Francisco [name] G Shaheim Mahar 11/04/2017, 1:21 PM

## 2017-11-04 NOTE — Evaluation (Signed)
Physical Therapy Evaluation Patient Details Name: Francisco [name] MRN: [id number] DOB: [birth year] Today's Date: 11/04/2017   History of Present Illness  Francisco [name] is a 72 y.o. male with medical history significant for steroid-dependent COPD with chronic hypoxic respiratory failure and hypertension, thoracic compression fx and venous stasis of both LE's presented to the emergency department after 2 syncopal episodes in the past 3 days  Clinical Impression  Patient presents with decreased mobility due to generalized weakness from falls and hospitalization.  Was significant for 20 mmHg drop in systolic BP in standing, but without symptoms and improved after standing few minutes.  Patient currently minguard to S level with RW for mobility in hallway.  Has wife assist at home.  Feel can go home with family support and follow up HHPT.  Did state felt like he was without oxygen each time he passed out/fell at home so discussed wearing O2 continuously.  He questions a different delivery method and referred him to pulmonologist.  PT to follow until d/c and will try out rollator next visit.   Orthostatic VS for the past 24 hrs (Last 3 readings):  BP- Lying Pulse- Lying BP- Sitting Pulse- Sitting BP- Standing at 0 minutes Pulse- Standing at 0 minutes BP- Standing at 3 minutes Pulse- Standing at 3 minutes  11/04/17 1541 144/89 106 (!) 132/92 108 120/74 117 126/85 114      Follow Up Recommendations Home health PT;Supervision/Assistance - 24 hour    Equipment Recommendations  Other (comment)(rollator)    Recommendations for Other Services       Precautions / Restrictions Precautions Precautions: Fall      Mobility  Bed Mobility Overal bed mobility: Needs Assistance Bed Mobility: Supine to Sit     Supine to sit: Supervision;HOB elevated     General bed mobility comments: no physical help, just assist for lines  Transfers Overall transfer level: Needs assistance Equipment used:  Rolling walker (2 wheeled) Transfers: Sit to/from Stand Sit to Stand: Supervision;Min guard;From elevated surface         General transfer comment: elevated height to simulate his bed at home, assist for lines, safety  Ambulation/Gait Ambulation/Gait assistance: Min guard;Supervision Ambulation Distance (Feet): 130 Feet Assistive device: Rolling walker (2 wheeled) Gait Pattern/deviations: Step-through pattern;Decreased stride length;Shuffle     General Gait Details: cues for foot clearance, assist for safety with O2 and heart monitor, SpO2 93% on 2L O2 increased respirations  Stairs            Wheelchair Mobility    Modified Rankin (Stroke Patients Only)       Balance Overall balance assessment: Needs assistance   Sitting balance-Leahy Scale: Good     Standing balance support: No upper extremity supported Standing balance-Leahy Scale: Fair Standing balance comment: can stand without UE support, but needs RW for ambulation                             Pertinent Vitals/Pain Pain Assessment: Faces Faces Pain Scale: Hurts little more Pain Location: R UE from deep laceration Pain Descriptors / Indicators: Sore Pain Intervention(s): Monitored during session;Repositioned;Premedicated before session    Home Living Family/patient expects to be discharged to:: Private residence Living Arrangements: Spouse/significant other Available Help at Discharge: Family Type of Home: House Home Access: Stairs to enter Entrance Stairs-Rails: Right Entrance Stairs-Number of Steps: 3 Home Layout: One level Home Equipment: Environmental consultant - 2 wheels;Shower seat  Prior Function Level of Independence: Independent         Comments: reports more wobbly lately, two falls due to syncope PTA, reports wasn't wearing his oxygen at the time of the falls     Hand Dominance        Extremity/Trunk Assessment   Upper Extremity Assessment Upper Extremity Assessment:  Generalized weakness    Lower Extremity Assessment Lower Extremity Assessment: RLE deficits/detail;LLE deficits/detail RLE Deficits / Details: lifts legs and holds to moderate resistance, note skin changes and weeping of bilat LE's LLE Deficits / Details: lifts legs and holds to moderate resistance, note skin changes and weeping of bilat LE's       Communication   Communication: No difficulties  Cognition Arousal/Alertness: Awake/alert Behavior During Therapy: WFL for tasks assessed/performed Overall Cognitive Status: Within Functional Limits for tasks assessed                                        General Comments General comments (skin integrity, edema, etc.): weeping LE's, wraps on L hand and R arm, RN about to change dressing, pt reportes R arm with skin laid back from fall; orthostatics taken    Exercises     Assessment/Plan    PT Assessment Patient needs continued PT services  PT Problem List Decreased strength;Decreased activity tolerance;Decreased balance;Decreased knowledge of use of DME;Cardiopulmonary status limiting activity;Decreased safety awareness       PT Treatment Interventions DME instruction;Therapeutic activities;Gait training;Therapeutic exercise;Patient/family education;Balance training;Functional mobility training;Stair training    PT Goals (Current goals can be found in the Care Plan section)  Acute Rehab PT Goals Patient Stated Goal: to go home tomorrow if tests are OK PT Goal Formulation: With patient Time For Goal Achievement: 11/11/17 Potential to Achieve Goals: Good    Frequency Min 3X/week   Barriers to discharge        Co-evaluation               AM-PAC PT "6 Clicks" Daily Activity  Outcome Measure Difficulty turning over in bed (including adjusting bedclothes, sheets and blankets)?: A Little Difficulty moving from lying on back to sitting on the side of the bed? : A Little Difficulty sitting down on and  standing up from a chair with arms (e.g., wheelchair, bedside commode, etc,.)?: A Little Help needed moving to and from a bed to chair (including a wheelchair)?: A Little Help needed walking in hospital room?: A Little Help needed climbing 3-5 steps with a railing? : A Little 6 Click Score: 18    End of Session Equipment Utilized During Treatment: Gait belt;Oxygen Activity Tolerance: Patient limited by fatigue Patient left: in bed;with call bell/phone within reach(due to RN coming to change dressings)   PT Visit Diagnosis: Other abnormalities of gait and mobility (R26.89);Muscle weakness (generalized) (M62.81)    Time: 0350-0938 PT Time Calculation (min) (ACUTE ONLY): 27 min   Charges:   PT Evaluation $PT Eval Moderate Complexity: 1 Mod PT Treatments $Gait Training: 8-22 mins   PT G CodesMagda Kiel, [place] [phone] 11/04/2017   Reginia Naas 11/04/2017, 3:40 PM

## 2017-11-04 NOTE — Care Management Obs Status (Signed)
[place] NOTIFICATION   Patient Details  Name: Francisco [name] MRN: [id number] Date of Birth: 1945/10/08   Medicare Observation Status Notification Given:  Yes    Carles Collet, RN 11/04/2017, 9:12 AM

## 2017-11-05 DIAGNOSIS — R0902 Hypoxemia: Secondary | ICD-10-CM

## 2017-11-05 DIAGNOSIS — T148XXA Other injury of unspecified body region, initial encounter: Secondary | ICD-10-CM

## 2017-11-05 DIAGNOSIS — I10 Essential (primary) hypertension: Secondary | ICD-10-CM | POA: Diagnosis not present

## 2017-11-05 DIAGNOSIS — R55 Syncope and collapse: Secondary | ICD-10-CM | POA: Diagnosis not present

## 2017-11-05 DIAGNOSIS — S51811A Laceration without foreign body of right forearm, initial encounter: Secondary | ICD-10-CM | POA: Diagnosis not present

## 2017-11-05 LAB — BASIC METABOLIC PANEL
ANION GAP: 6 (ref 5–15)
BUN: 8 mg/dL (ref 6–20)
CO2: 30 mmol/L (ref 22–32)
Calcium: 8.6 mg/dL — ABNORMAL LOW (ref 8.9–10.3)
Chloride: 95 mmol/L — ABNORMAL LOW (ref 101–111)
Creatinine, Ser: 0.73 mg/dL (ref 0.61–1.24)
Glucose, Bld: 93 mg/dL (ref 65–99)
Potassium: 4.3 mmol/L (ref 3.5–5.1)
SODIUM: 131 mmol/L — AB (ref 135–145)

## 2017-11-05 LAB — CBC
HCT: 30.1 % — ABNORMAL LOW (ref 39.0–52.0)
HEMOGLOBIN: 10.3 g/dL — AB (ref 13.0–17.0)
MCH: 31.6 pg (ref 26.0–34.0)
MCHC: 34.2 g/dL (ref 30.0–36.0)
MCV: 92.3 fL (ref 78.0–100.0)
Platelets: 184 10*3/uL (ref 150–400)
RBC: 3.26 MIL/uL — AB (ref 4.22–5.81)
RDW: 13.2 % (ref 11.5–15.5)
WBC: 8.1 10*3/uL (ref 4.0–10.5)

## 2017-11-05 LAB — GLUCOSE, CAPILLARY: Glucose-Capillary: 89 mg/dL (ref 65–99)

## 2017-11-05 MED ORDER — DICLOFENAC SODIUM 1 % TD GEL
2.0000 g | Freq: Four times a day (QID) | TRANSDERMAL | Status: DC
  Filled 2017-11-05: qty 100

## 2017-11-05 MED ORDER — PULSE OXIMETER FOR FINGER MISC: 1.0000 | 0 refills | Status: DC | PRN

## 2017-11-05 MED ORDER — KERLIX GAUZE ROLL LARGE MISC: 5 refills | Status: DC

## 2017-11-05 MED ORDER — TETANUS-DIPHTH-ACELL PERTUSSIS 5-2.5-18.5 LF-MCG/0.5 IM SUSP
0.5000 mL | Freq: Once | INTRAMUSCULAR | Status: AC
  Administered 2017-11-05: 0.5 mL via INTRAMUSCULAR
  Filled 2017-11-05: qty 0.5

## 2017-11-05 MED ORDER — HYDROCODONE-ACETAMINOPHEN 5-325 MG PO TABS: 1.0000 | ORAL_TABLET | ORAL | 0 refills | Status: DC | PRN

## 2017-11-05 MED ORDER — "XEROFORM PETROLAT GAUZE 5""X9"" EX MISC": CUTANEOUS | 5 refills | Status: DC

## 2017-11-05 NOTE — Discharge Instructions (Signed)
Wound care to left knee full thickness tissue loss and partial thickness lower LE areas that are weeping:  Cleanse with NS, pat dry. Cover knee with xeroform, top with dry gauze 4x4s and secure with Kerlix roll gauze. Wrap from toe to knee with Kerlix roll gauze and top with a 6-inch ACE bandage. Change twice daily.  Wound care to full thickness areas tissue loss at right forearm, left antecubital and left hand : Cleanse with NS, pat gently dry.Cover with xeroform gauze, top with dry 4x4s, and secure all with Kerlix roll gauze/paper tape.  Change twice daily.

## 2017-11-05 NOTE — Progress Notes (Signed)
SATURATION QUALIFICATIONS: (This note is used to comply with regulatory documentation for home oxygen)  Patient Saturations on Room Air at Rest = 86%  Patient Saturations on Room Air while Ambulating = NT as sats decr on RA at rest  Patient Saturations on 3 Liters of oxygen while Ambulating = 89%  Please briefly explain why patient needs home oxygen:Pt needs home O2 as he desats on RA and with activity.  Thanks.  [place] [phone] (pager)

## 2017-11-05 NOTE — Consult Note (Signed)
Francisco [name] wound consult note Reason for Consult: Dr. Eliseo Squires requested teaching of wound care to right forearm for patient, spouse, nurses, and case manager. Wound type: Trauma injury from falls at home The patient and his spouse were both involved in my teaching of existing dressing removal to right forearm, asked many questions, and were satisfied with the teaching to their expressed satisfaction and appreciation.  They were shown how to soak the dressing with saline, gently remove the gauze dressings, and spiral wrap Xeroform and kerlex from the hand to the antecubital area, and tape in place.   Val Riles, RN, MSN, CWOCN, CNS-BC, pager [phone]

## 2017-11-05 NOTE — Discharge Summary (Signed)
Physician Discharge Summary  NEAMIAH SCIARRA [name] EVO:[id number] DOB: 25-Feb-1946 DOA: 11/02/2017  PCP: [name], MD  Admit date: 11/02/2017 Discharge date: 11/05/2017   Recommendations for Outpatient Follow-Up:   1. Patient to use 2L O2 at rest and 3L with exertion 2. Orthostatic precautions 3. Wound care per wound care nurse and home health-- may need referral to plastics vs outpatient wound care center 4. Home health 5. Elevate extremities when at rest   Discharge Diagnosis:   Principal Problem:   Syncope Active Problems:   COPD (chronic obstructive pulmonary disease) with emphysema (HCC)   Chronic respiratory failure with hypoxia (HCC)   Thoracic compression fracture (HCC)   Essential hypertension   Venous stasis dermatitis of both lower extremities   Forearm laceration, right, initial encounter   Discharge disposition:  Home.  Discharge Condition: Improved.  Diet recommendation: Low sodium, heart healthy  Wound care: None.   History of Present Illness:   Francisco [name] is a 72 y.o. 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  Patient reports that he been in his usual state of health until 3 days ago when he was ambulating through his house and ended up on the ground.  There was no prodrome and he returned to his usual state rapidly.  He had a another episode today.  He denies hitting his head but his family believes that he did.  Denies headache, change in vision or hearing, or focal numbness or weakness.  He suffered some skin tears and a right dorsal forearm laceration with a fall.  He denies experiencing any chest pain or palpitations.  He has chronic bilateral lower extremity edema but denies tenderness.  No recent fevers or chills.  Respiratory status has been stable.     Hospital Course  by Problem:   Syncope -Presents following 2 syncopal episodes in past 3 days (all involved being off his O2 so suspect hypoxia) -He is slightly tachycardic and PE considered, but has chronic tachycardia based on office visit vitals and there is no other suggestion of VTE -No arrhythmia on EKG or monitor -echo: - Definity used but suboptimal; vigorous LV systolic function; mild   diastolic dysfunction; mildly dilated aortic root and ascending   aorta.   Steroid-dependent COPD; chronic hypoxic respiratory failure -Stable -Continue ICS/LABA, Spiriva, prn albuterol, daily prednisone, supplemental O2 per pulm  Forearm laceration -Patient suffered laceration to dorsal right forearm with the syncopal episode -Hand surgery was consulted by EDP and recommended wound-care RN eval -Continue wound care-- will need wound care at home and ? Plastics through doubt any intervention would help as chronically on steroids and skin very thin -nursing to help wife learn to change bandages -home health also ordered  Venous stasis dermatitis -Bilateral stasis dermatitis with serous weeping -Elevate legs, consult with wound-care RN Not compliant at home with elevation nor stockings  Hypertension -Managed at home with triamterene-HCTZ, held on admissionin light of hyponatremia  Thoracic compression fractures -T6, T7, and T8 fractures noted on CXR; T6 and T8 fxs new since January -Pain-control, PT eval   Hyponatremia  - responding off of HCTZ -monitor BMP as an outpatient        Medical Consultants:    Wound care   Discharge Exam:   Vitals:   11/05/17 1300 11/05/17 1341  BP: 116/73 116/73  Pulse: (!) 118 (!)  118  Resp:    Temp: 97.8 F (36.6 C) 97.8 F (36.6 C)  SpO2: 95%    Vitals:   11/05/17 0347 11/05/17 0349 11/05/17 1300 11/05/17 1341  BP:  128/74 116/73 116/73  Pulse:  (!) 111 (!) 118 (!) 118  Resp:  16    Temp:  (!) 97.4 F  (36.3 C) 97.8 F (36.6 C) 97.8 F (36.6 C)  TempSrc:  Oral Oral Oral  SpO2:   95%   Weight: 67 kg (147 lb 9.6 oz)     Height:        Gen:  NAD- anxious appearing   The results of significant diagnostics from this hospitalization (including imaging, microbiology, ancillary and laboratory) are listed below for reference.     Procedures and Diagnostic Studies:   Dg Chest 2 View  Result Date: 11/02/2017 CLINICAL DATA:  Pt reports 2 falls this weekend with multiple skin tears. Pt states he doesn't know why he fell. Pt having left lower lateral rib pain. EXAM: CHEST - 2 VIEW COMPARISON:  10/12/2013.  Chest CT, 07/17/2017. FINDINGS: Cardiac silhouette is normal in size and configuration. No mediastinal or hilar masses. Lungs are hyperexpanded. There is stable scarring at the right apex. No evidence of pneumonia or pulmonary edema. No pleural effusion or pneumothorax. There are 3 contiguous compression fractures of the midthoracic spine, T6, T7 and T8, mild of T6 and moderate of T7 and T8. The T7 fracture was present on the prior CT. The other 2 fractures are new since that exam. No other fractures.  Skeletal structures are demineralized. IMPRESSION: 1. No acute cardiopulmonary disease. 2. COPD.  Chronic right apical lung scarring. 3. Fractures of T6, T7 and T8. T7 was old. The T6 and T8 fractures are new since the CT dated 07/17/2017. Electronically Signed   By: Lajean Manes M.D.   On: 11/02/2017 19:25   Ct Head Wo Contrast  Result Date: 11/02/2017 CLINICAL DATA:  Patient states two falls this weekend with multiple skin tears. Pt states he doesn't know why he fell and "didn't know it was going to happen until I was on the ground". Does not take blood thinners. EXAM: CT HEAD WITHOUT CONTRAST TECHNIQUE: Contiguous axial images were obtained from the base of the skull through the vertex without intravenous contrast. COMPARISON:  None. FINDINGS: Brain: No evidence of acute infarction, hemorrhage,  hydrocephalus, extra-axial collection or mass lesion/mass effect. Minor periventricular white matter hypoattenuation consistent with chronic microvascular ischemic change. Vascular: No hyperdense vessel or unexpected calcification. Skull: Normal. Negative for fracture or focal lesion. Sinuses/Orbits: Globes and orbits are unremarkable. Small mucous retention cyst in the right sphenoid sinus and right maxillary sinus. Clear mastoid air cells. Other: None. IMPRESSION: 1. No acute intracranial abnormalities. 2. Minor chronic microvascular ischemic change. Electronically Signed   By: Lajean Manes M.D.   On: 11/02/2017 19:21     Labs:   Basic Metabolic Panel: Recent Labs  Lab 11/02/17 1957  11/03/17 0114 11/03/17 0940 11/04/17 0449 11/05/17 0340  NA  --   --  129* 127* 130* 131*  K  --    < > 4.0 4.1 4.4 4.3  CL  --   --  95* 89* 92* 95*  CO2  --   --  26 26 27 30   GLUCOSE  --   --  125* 124* 90 93  BUN  --   --  10 10 8 8   CREATININE  --   --  0.78 0.83 0.80  0.73  CALCIUM  --   --  8.4* 8.7* 9.1 8.6*  MG 1.9  --   --   --   --   --    < > = values in this interval not displayed.   GFR Estimated Creatinine Clearance: 81.4 mL/min (by C-G formula based on SCr of 0.73 mg/dL). Liver Function Tests: Recent Labs  Lab 11/02/17 1957  AST 24  ALT 17  ALKPHOS 86  BILITOT 0.9  PROT 6.5  ALBUMIN 3.3*   Recent Labs  Lab 11/02/17 1957  LIPASE 28   No results for input(s): AMMONIA in the last 168 hours. Coagulation profile No results for input(s): INR, PROTIME in the last 168 hours.  CBC: Recent Labs  Lab 11/02/17 1957 11/03/17 0940 11/04/17 0449 11/05/17 0340  WBC 11.3* 8.9 9.7 8.1  HGB 12.8* 11.7* 11.6* 10.3*  HCT 37.1* 34.7* 34.4* 30.1*  MCV 93.5 93.5 92.2 92.3  PLT 243 214 213 184   Cardiac Enzymes: No results for input(s): CKTOTAL, CKMB, CKMBINDEX, TROPONINI in the last 168 hours. BNP: Invalid input(s): POCBNP CBG: Recent Labs  Lab 11/03/17 0930 11/04/17 0532  11/05/17 0739  GLUCAP 134* 112* 89   D-Dimer No results for input(s): DDIMER in the last 72 hours. Hgb A1c No results for input(s): HGBA1C in the last 72 hours. Lipid Profile No results for input(s): CHOL, HDL, LDLCALC, TRIG, CHOLHDL, LDLDIRECT in the last 72 hours. Thyroid function studies No results for input(s): TSH, T4TOTAL, T3FREE, THYROIDAB in the last 72 hours.  Invalid input(s): FREET3 Anemia work up No results for input(s): VITAMINB12, FOLATE, FERRITIN, TIBC, IRON, RETICCTPCT in the last 72 hours. Microbiology No results found for this or any previous visit (from the past 240 hour(s)).   Discharge Instructions:   Discharge Instructions    Diet - low sodium heart healthy   Complete by:  As directed    Discharge instructions   Complete by:  As directed    Home health See wound care instructions Orthostatic precautions-- change positions slowly after body adjusts   Increase activity slowly   Complete by:  As directed      Allergies as of 11/05/2017      Reactions   Tape Other (See Comments)   SKIN IS VERY THIN; CAN ONLY USE COBAN WRAPS DUE TO CONDITION OF SKIN!!      Medication List    STOP taking these medications   LAMISIL 250 MG tablet Generic drug:  terbinafine   naproxen sodium 220 MG tablet Commonly known as:  ALEVE   triamterene-hydrochlorothiazide 37.5-25 MG tablet Commonly known as:  MAXZIDE-25     TAKE these medications   albuterol 108 (90 Base) MCG/ACT inhaler Commonly known as:  VENTOLIN HFA USE 2 PUFFS EVERY 6 HOURS  AS NEEDED FOR WHEEZING   azithromycin 250 MG tablet Commonly known as:  ZITHROMAX TAKE 1 TABLET BY MOUTH ON  MONDAY, WEDNESDAY, AND  FRIDAY   fluticasone 50 MCG/ACT nasal spray Commonly known as:  FLONASE USE 2 SPRAYS IN EACH  NOSTRIL DAILY   HYDROcodone-acetaminophen 5-325 MG tablet Commonly known as:  NORCO/VICODIN Take 1-2 tablets by mouth every 4 (four) hours as needed for moderate pain.   KERLIX GAUZE ROLL LARGE  Misc secure all dressed wounds with Kerlix roll gauze/paper tape   montelukast 10 MG tablet Commonly known as:  SINGULAIR TAKE 1 TABLET BY MOUTH  EVERY NIGHT AT BEDTIME   OXYGEN Inhale 2 L into the lungs continuous.   predniSONE 10  MG tablet Commonly known as:  DELTASONE TAKE 1 TABLET BY MOUTH  DAILY WITH BREAKFAST   PULSE OXIMETER FOR FINGER Misc 1 Device by Does not apply route as needed.   SPIRIVA RESPIMAT 2.5 MCG/ACT Aers Generic drug:  Tiotropium Bromide Monohydrate USE 2 SPRAYS (INHALATIONS)  ONCE DAILY   SYMBICORT 160-4.5 MCG/ACT inhaler Generic drug:  budesonide-formoterol USE 2 PUFFS BY MOUTH TWO  TIMES DAILY   XEROFORM PETROLAT GAUZE 5"X9" Misc Use on wounds as directed at hospital            [place]  (From admission, onward)        Start     Ordered   11/05/17 1329  For home use only DME 3 n 1  Once     11/05/17 1328   11/05/17 1316  For home use only DME 4 wheeled rolling walker with seat  Once    Question:  Patient needs a walker to treat with the following condition  Answer:  Weakness   11/05/17 1315   11/05/17 1308  For home use only DME oxygen  Once    Question Answer Comment  Mode or (Route) Nasal cannula   Liters per Minute 3   Oxygen delivery system Gas      11/05/17 1307     Follow-up Information    Haddix, Thomasene Lot, MD. Schedule an appointment as soon as possible for a visit in 2 week(s).   Specialty:  Orthopedic Surgery Contact information: [address] [place] [address] [phone]        [facility], [facility] Follow [facility].   Specialty:  Home Health Services Why:  Registered Nurse for Wound Care and Physical Therapy Contact information: [address] [place] [address] [place] Follow up.   Why:  Rolling Walker with Seat- Oxygen with increased liter flow.,3n1  Contact information: [address] [place]   [address] [phone]            Time coordinating discharge: 35 min  Signed:  Geradine Girt   [facility] 11/05/2017, 2:51 PM

## 2017-11-05 NOTE — Progress Notes (Signed)
Patient and family received discharge information and acknowledged understanding of it. Patient received walker, 3in1. Patient received prescriptions. Patient IV was removed.

## 2017-11-05 NOTE — Progress Notes (Addendum)
Physical Therapy Treatment Patient Details Name: Francisco [name] MRN: [id number] DOB: [birth year] Today's Date: 11/05/2017    History of Present Illness Francisco [name] is a 72 y.o. male with medical history significant for steroid-dependent COPD with chronic hypoxic respiratory failure and hypertension, thoracic compression fx and venous stasis of both LE's presented to the emergency department after 2 syncopal episodes in the past 3 days    PT Comments    Pt admitted with above diagnosis. Pt currently with functional limitations due to balance and endurance deficits. Pt was able to ambulate with rollator with good safety overall with occasional cues to slow down but wife is aware of how to cue pt.  Pt was able to go up and down steps as well with min guard assist.  Rollator works well as pt can rest prn and did so with ambulation.  REquires 2LO2 at rest and does best with 3L with activity.  VS fluctuated as below but pt was not symptomatic with BP changes.   Pt will benefit from skilled PT to increase their independence and safety with mobility to allow discharge to the venue listed below.    Orthostatic BPs  Supine 114/72, 105 bpm  Sitting 102/66, 110 bpm  Standing 88/66, 117 bpm  Standing after 3 min 99/66, 109 bpm   HR up as high as 136 bpm with activity needing rest breaks.  O2 sat down to 86% with activity needing sitting rest breaks.  SATURATION QUALIFICATIONS: (This note is used to comply with regulatory documentation for home oxygen)  Patient Saturations on Room Air at Rest = 86%  Patient Saturations on Room Air while Ambulating = NT as sats decr on RA at rest  Patient Saturations on 3 Liters of oxygen while Ambulating = 89%  Please briefly explain why patient needs home oxygen:Pt needs home O2 as he desats on RA and with activity.  Follow Up Recommendations  Home health PT;Supervision/Assistance - 24 hour(HHOT, HHRN)     Equipment Recommendations  Other (comment);3in1  (PT)(rollator, pulse oximeter, portable O2 concentrator/car adaptor - FAmily wants it lightweight as possible and will pay difference per insurance per family)  HHOT can address tub equipment in the home   Recommendations for Other Services       Precautions / Restrictions Precautions Precautions: Fall Restrictions Weight Bearing Restrictions: No    Mobility  Bed Mobility Overal bed mobility: Needs Assistance Bed Mobility: Supine to Sit     Supine to sit: Supervision;HOB elevated     General bed mobility comments: no physical help, just assist for lines  Transfers Overall transfer level: Needs assistance Equipment used: Rolling walker (2 wheeled) Transfers: Sit to/from Stand Sit to Stand: Supervision;Min guard;From elevated surface         General transfer comment: elevated height to simulate his bed at home, assist for lines, safety  Ambulation/Gait Ambulation/Gait assistance: Min guard;Supervision Ambulation Distance (Feet): 300 Feet(150 feet x 2) Assistive device: Rolling walker (2 wheeled) Gait Pattern/deviations: Step-through pattern;Shuffle;Decreased stride length;Trunk flexed;Drifts right/left   Gait velocity interpretation: <1.31 ft/sec, indicative of household ambulator General Gait Details:  assist for safety with O2 and heart monitor, SpO2 87% on 2L O2. Incr to 3L to keep sats >90% with  increased respirations with ambulation with DOE 3/4.   Stairs Stairs: Yes Stairs assistance: Min guard Stair Management: One rail Right;Sideways;Step to pattern Number of Stairs: 3 General stair comments: Pt did well on steps with min guard assist. Wife and daughter present.  Wheelchair Mobility    Modified Rankin (Stroke Patients Only)       Balance Overall balance assessment: Needs assistance Sitting-balance support: No upper extremity supported;Feet supported Sitting balance-Leahy Scale: Good     Standing balance support: Bilateral upper extremity  supported;During functional activity Standing balance-Leahy Scale: Poor Standing balance comment: can stand without UE support, but needs RW for ambulation                            Cognition Arousal/Alertness: Awake/alert Behavior During Therapy: WFL for tasks assessed/performed Overall Cognitive Status: Within Functional Limits for tasks assessed                                        Exercises      General Comments        Pertinent Vitals/Pain Pain Assessment: No/denies pain    Home Living                      Prior Function            PT Goals (current goals can now be found in the care plan section) Progress towards PT goals: Progressing toward goals    Frequency    Min 3X/week      PT Plan Current plan remains appropriate    Co-evaluation              AM-PAC PT "6 Clicks" Daily Activity  Outcome Measure  Difficulty turning over in bed (including adjusting bedclothes, sheets and blankets)?: A Little Difficulty moving from lying on back to sitting on the side of the bed? : A Little Difficulty sitting down on and standing up from a chair with arms (e.g., wheelchair, bedside commode, etc,.)?: A Little Help needed moving to and from a bed to chair (including a wheelchair)?: A Little Help needed walking in hospital room?: A Little Help needed climbing 3-5 steps with a railing? : A Little 6 Click Score: 18    End of Session Equipment Utilized During Treatment: Gait belt;Oxygen Activity Tolerance: Patient limited by fatigue Patient left: in bed;with call bell/phone within reach;with family/visitor present Nurse Communication: Mobility status(Check IV site as tape is loose) PT Visit Diagnosis: Other abnormalities of gait and mobility (R26.89);Muscle weakness (generalized) (M62.81)     Time: 9735-3299 PT Time Calculation (min) (ACUTE ONLY): 67 min  Charges:  $Gait Training: 23-37 mins $Therapeutic Activity: 8-22  mins $Self Care/Home Management: 8-22                    G Codes:       Letitia Sabala,PT Acute Rehabilitation [phone] (pager)    Denice Paradise 11/05/2017, 10:32 AM

## 2017-11-06 ENCOUNTER — Telehealth: Payer: Self-pay | Admitting: *Deleted

## 2017-11-06 NOTE — Telephone Encounter (Signed)
Per chart review: Admit date: 11/02/2017 Discharge date: 11/05/2017   Recommendations for Outpatient Follow-Up:   1. Patient to use 2L O2 at rest and 3L with exertion 2. Orthostatic precautions 3. Wound care per wound care nurse and home health-- may need referral to plastics vs outpatient wound care center 4. Home health 5. Elevate extremities when at rest   Discharge Diagnosis:   Principal Problem:   Syncope Active Problems:   COPD (chronic obstructive pulmonary disease) with emphysema (HCC)   Chronic respiratory failure with hypoxia (HCC)   Thoracic compression fracture (HCC)   Essential hypertension   Venous stasis dermatitis of both lower extremities   Forearm laceration, right, initial encounter   Discharge disposition:  Home.  Discharge Condition: Improved.  Diet recommendation: Low sodium, heart healthy  Wound care: None. ______________________________________________________________________ Transition Care Management Follow-up Telephone Call   Date discharged? 11/05/17   How have you been since you were released from the hospital? "ok"   Do you understand why you were in the hospital? yes   Do you understand the discharge instructions? Yes. Patient was unsure how often to check his oxygen saturation. I advised him to check it when he is ambulating, doing anything strenuous or feels short of breath. I advised him to call the office if he notices that his oxygen saturation is consistently staying below 92% and call immediately if it is below 90%. All questions answered.    Where were you discharged to? Home with home health    Items Reviewed:  Medications reviewed: yes  Allergies reviewed: yes  Dietary changes reviewed: yes  Referrals reviewed: yes   Functional Questionnaire:   Activities of Daily Living (ADLs):   He states they are independent in the following: ambulation, bathing and hygiene, feeding, continence, grooming, toileting,  dressing and Patients wife and home health assisting with some activities and wound care States they require assistance with the following: see above   Any transportation issues/concerns?: no   Any patient concerns? Yes. Patient would like for Dr Yong Channel to decide if he needs to see an orthopedist as advised by the hospital (due to fractures in his spine). He states that Dr Yong Channel will also have to decide if he needs to follow up with wound center or plastics. Patient and his wife will arrive 15-20 minutes early for nursing staff to take down wound care dressings in order for Dr Yong Channel to assess. Patient is also concerned that he does not have enough hydrocodone to get him through to Tuesday. He is taking 1-2 tabs as needed. If Dr Yong Channel agrees he would like to have extra sent in.    Confirmed importance and date/time of follow-up visits scheduled yes  Provider Appointment booked with 11/11/17 11:30  Confirmed with patient if condition begins to worsen call PCP or go to the ER.  Patient was given the office number and encouraged to call back with question or concerns.  : yes

## 2017-11-06 NOTE — Telephone Encounter (Signed)
noted thanks. Does he want a decision on referrals prior to visit or ok on waiting until visit?  Garret Reddish

## 2017-11-07 ENCOUNTER — Telehealth: Payer: Self-pay | Admitting: Family Medicine

## 2017-11-07 NOTE — Telephone Encounter (Signed)
See note

## 2017-11-07 NOTE — Telephone Encounter (Signed)
Noted  

## 2017-11-07 NOTE — Telephone Encounter (Signed)
He was ok with waiting until his visit. I did advise him to call the office immediately if anything changes.

## 2017-11-07 NOTE — Telephone Encounter (Signed)
Spoke with Francisco [name] and provided verbal orders for nursing services as requested. I am faxing over an order to [facility] for a portable Oxygen Concentrator for patient to use outside the home. THe fax number is [phone]

## 2017-11-07 NOTE — Telephone Encounter (Signed)
Copied from [place] [id number]. Topic: Quick Communication - See Telephone Encounter >> Nov 07, 2017  3:41 PM Vernona Rieger wrote: CRM for notification. See Telephone encounter for: 11/07/17.  [facility] verbals orders for the patient to have a portable O2 tank. He is being discharged from the hospital. She also needs skilled nursing for him 2 week 2 , 1 week 1. He has several wounds, he had a fall. They want to assess and monitor that along with wound care.  Call back [phone]

## 2017-11-10 ENCOUNTER — Telehealth: Payer: Self-pay | Admitting: Family Medicine

## 2017-11-10 NOTE — Telephone Encounter (Signed)
Called and spoke to [facility] and gave the verbal orders for PT. Rachel Moulds stated she would fax over the paperwork as well.

## 2017-11-10 NOTE — Telephone Encounter (Signed)
Copied from [place] [id number]. Topic: General - Other >> Nov 10, 2017  9:57 AM Carolyn Stare wrote:  Madelyn Flavors a PT with [facility] call to req verbal orders for 1 times a week for 1 week , 2 times a week for 2 weeks    [phone]

## 2017-11-11 ENCOUNTER — Ambulatory Visit (INDEPENDENT_AMBULATORY_CARE_PROVIDER_SITE_OTHER): Payer: Medicare Other | Admitting: Family Medicine

## 2017-11-11 ENCOUNTER — Encounter: Payer: Self-pay | Admitting: Family Medicine

## 2017-11-11 VITALS — BP 132/86 | HR 98 | Temp 98.3°F | Ht 72.0 in | Wt 148.4 lb

## 2017-11-11 DIAGNOSIS — S22000A Wedge compression fracture of unspecified thoracic vertebra, initial encounter for closed fracture: Secondary | ICD-10-CM

## 2017-11-11 DIAGNOSIS — S51811A Laceration without foreign body of right forearm, initial encounter: Secondary | ICD-10-CM | POA: Diagnosis not present

## 2017-11-11 DIAGNOSIS — S41101A Unspecified open wound of right upper arm, initial encounter: Secondary | ICD-10-CM

## 2017-11-11 DIAGNOSIS — I10 Essential (primary) hypertension: Secondary | ICD-10-CM

## 2017-11-11 DIAGNOSIS — J438 Other emphysema: Secondary | ICD-10-CM

## 2017-11-11 LAB — CBC
HEMATOCRIT: 34.8 % — AB (ref 39.0–52.0)
HEMOGLOBIN: 11.8 g/dL — AB (ref 13.0–17.0)
MCHC: 34 g/dL (ref 30.0–36.0)
MCV: 95.2 fl (ref 78.0–100.0)
PLATELETS: 358 10*3/uL (ref 150.0–400.0)
RBC: 3.65 Mil/uL — ABNORMAL LOW (ref 4.22–5.81)
RDW: 13.8 % (ref 11.5–15.5)
WBC: 8.4 10*3/uL (ref 4.0–10.5)

## 2017-11-11 LAB — BASIC METABOLIC PANEL
BUN: 10 mg/dL (ref 6–23)
CALCIUM: 8.7 mg/dL (ref 8.4–10.5)
CHLORIDE: 96 meq/L (ref 96–112)
CO2: 30 meq/L (ref 19–32)
Creatinine, Ser: 0.71 mg/dL (ref 0.40–1.50)
GFR: 116.52 mL/min (ref >60.00)
Glucose, Bld: 101 mg/dL — ABNORMAL HIGH (ref 70–99)
Potassium: 4.5 mEq/L (ref 3.5–5.1)
Sodium: 135 mEq/L (ref 135–145)

## 2017-11-11 MED ORDER — CALCITONIN (SALMON) 200 UNIT/ACT NA SOLN: 1.0000 | Freq: Every day | NASAL | 0 refills | Status: DC

## 2017-11-11 MED ORDER — HYDROCODONE-ACETAMINOPHEN 5-325 MG PO TABS: 1.0000 | ORAL_TABLET | ORAL | 0 refills | Status: DC | PRN

## 2017-11-11 NOTE — Assessment & Plan Note (Addendum)
Syncope was thought due to hypoxia.  He is now on chronic oxygen until he follows up with pulmonary.  He has a visit scheduled in June already but he is considering getting a second opinion within [facility] pulmonary.  In the hospital they also mentioned doing plan of pulmonary rehabilitation once his wounds have healed-I think this is reasonable  He does have some increased congestion for him and some mild wheeze.  Usually he doubles prednisone for 4 days per pulmonary and then follows up if he is not improving.  I think this is reasonable.  His concern was the osteo-porosis potential-I still think we have to control his breathing issues first-while also addressing osteoporosis concerns

## 2017-11-11 NOTE — Assessment & Plan Note (Signed)
Extensive laceration.  We have referred him to the wound care center primarily for wound on the right forearm but also for help with multiple other wounds.  I would ask their opinion on plastic surgery referral-wonder if patient will eventually need skin graft.  Hopeful this heals by secondary intention.  We redressed all his wounds today before he left.

## 2017-11-11 NOTE — Progress Notes (Signed)
Subjective:  Francisco [name] is a 72 y.o. year old very pleasant male patient who presents for transitional care management and hospital follow up for syncope. Patient was hospitalized from 11/02/2017 to 11/05/2017. A TCM phone call was completed on 11/06/2017. Medical complexity all oh high  72 year old male with steroid-dependent COPD and chronic respiratory failure, hypertension who presented to the emergency department after 2 syncopal episodes within 3 days.  The first episode patient was simply walking through his house and the next thing he woke up and he was on the ground.  No prodrome.  He was not using oxygen. he had been in his normal state of health prior to that.  He had another episode on day of admission-once again not using oxygen and up walking.  There was some concern that he had hit his head.  Patient had multiple skin tears and a laceration of his right dorsal forearm.  In regards to his syncope-both episodes were thought to be related to him being off his oxygen and resultant hypoxia.  Hospitalist considered pulmonary embolism due to tachycardia but patient has baseline tachycardia and there was no other reason to suggest pulmonary embolism such as signs or symptoms of DVT.  There was no arrhythmia on EKG or telemetry.  An echocardiogram was performed and noted to be suboptimal.  He had a vigorous LV systolic function.  Very mild diastolic dysfunction.  He had very mildly dilated aortic root and ascending aorta.  It was also thought that orthostatic hypotension contributed  For his hypertension -home triamterene hydrochlorothiazide was stopped due to orthostatic concerns as well as hyponatremia.We will need to update his bmet due to hyponatremia in the hospital.  He has remained off his blood pressure medicine since being home and blood pressure remains controlled.  In regards to his COPD and chronic respiratory failure.  This was stable in the hospital.  He was continued on his Spiriva,  inhaled corticosteroid and Laba as well as daily prednisone and supplemental oxygen.  He was advised to use oxygen at all times until pulmonary follow-up.  In regards to his forearm laceration due to syncope. Hand surgery was consulted by the ED physician who recommended wound care and RN evaluation.  He was sent home with home wound care.  There was some consideration of referring to plastic surgery but notes state they did not think that intervention would be helpful.  On the other hand they also asked about wound care center referral.   Patient with baseline venous stasis dermatitis-in the hospital he was advised to elevate his legs and advised to use compression stockings-he hd not compliant with these at home previously.   Patient was also noted to have T6, T7, T8 fractures noted on chest x-ray.  The T6 and T8 fractures were new since January.  He was treated with PT and pain control.  He was placed on hydrocodone for short-term use.  NCCSRS reviewed and only fill this from the hospital.  No signs of high risk use  He is using 3 hydrocodone per day, wakes up in pretty intense pain. Pain 5/10 with medication. Wakes up with 7/10 pain. He has 4 pills left  Of note I reviewed imaging from hospital personally.  And on 11/02/2017 patient had chest x-ray which showed findings of COPD without acute cardiopulmonary disease.  On lateral view-  Compression fracture is noted at T6, T7 and T8.  Patient also had CT head without contrast on 11/02/2017 which showed no acute  intracranial abnormalities but did show some microvascular ischemic change.   See problem oriented charting as well ROS-complains of slight increased congestion and cough, no fever chills.  No expanding redness near wounds.  Denies recurrent syncope.  Denies lightheadedness.  Past Medical History-  Patient Active Problem List   Diagnosis Date Noted  . Thoracic compression fracture ([place]) 11/02/2017    Priority: High  . TOBACCO USE  08/23/2008    Priority: High  . COPD (chronic obstructive pulmonary disease) with emphysema ([place]) 08/23/2008    Priority: High  . BPH associated with nocturia 06/25/2017    Priority: Medium  . Hyperglycemia 06/12/2016    Priority: Medium  . Hyperlipidemia 07/22/2014    Priority: Medium  . Onychomycosis 07/22/2014    Priority: Low  . Left ankle swelling 07/22/2014    Priority: Low  . History of skin cancer 05/17/2014    Priority: Low  . Chronic rhinitis 10/28/2012    Priority: Low  . Pulmonary nodule 09/23/2011    Priority: Low  . History of colonic polyps 08/23/2008    Priority: Low  . Syncope 11/02/2017  . Essential hypertension 11/02/2017  . Venous stasis dermatitis of both lower extremities 11/02/2017  . Forearm laceration, right, initial encounter 11/02/2017  . Chronic respiratory failure with hypoxia ([place]) 09/23/2017  . Pleuritic pain 07/17/2017  . Leukocytosis 12/19/2016    Medications- reviewed and updated  A medical reconciliation was performed comparing current medicines to hospital discharge medications. Current Outpatient Medications  Medication Sig Dispense Refill  . albuterol (VENTOLIN HFA) 108 (90 Base) MCG/ACT inhaler USE 2 PUFFS EVERY 6 HOURS  AS NEEDED FOR WHEEZING 3 Inhaler 3  . azithromycin (ZITHROMAX) 250 MG tablet TAKE 1 TABLET BY MOUTH ON  MONDAY, WEDNESDAY, AND  FRIDAY 39 tablet 1  . Bismuth Tribromoph-Petrolatum (XEROFORM PETROLAT GAUZE 5"X9") MISC Use on wounds as directed at hospital 50 each 5  . fluticasone (FLONASE) 50 MCG/ACT nasal spray USE 2 SPRAYS IN EACH  NOSTRIL DAILY 48 g 1  . Gauze Pads & Dressings (KERLIX GAUZE ROLL LARGE) MISC secure all dressed wounds with Kerlix roll gauze/paper tape 100 each 5  . HYDROcodone-acetaminophen (NORCO/VICODIN) 5-325 MG tablet Take 1-2 tablets by mouth every 4 (four) hours as needed for moderate pain. 20 tablet 0  . Misc. Devices (PULSE OXIMETER FOR FINGER) MISC 1 Device by Does not apply route as needed. 1  each 0  . montelukast (SINGULAIR) 10 MG tablet TAKE 1 TABLET BY MOUTH  EVERY NIGHT AT BEDTIME 90 tablet 3  . OXYGEN Inhale 2 L into the lungs continuous.    . predniSONE (DELTASONE) 10 MG tablet TAKE 1 TABLET BY MOUTH  DAILY WITH BREAKFAST 90 tablet 3  . SPIRIVA RESPIMAT 2.5 MCG/ACT AERS USE 2 SPRAYS (INHALATIONS)  ONCE DAILY 12 g 1  . SYMBICORT 160-4.5 MCG/ACT inhaler USE 2 PUFFS BY MOUTH TWO  TIMES DAILY 3 Inhaler 1  . calcitonin, salmon, (MIACALCIN/FORTICAL) 200 UNIT/ACT nasal spray Place 1 spray into alternate nostrils daily. 3.7 mL 0   No current facility-administered medications for this visit.     Objective: BP 132/86 (BP Location: Left Arm, Patient Position: Sitting, Cuff Size: Normal)   Pulse 98   Temp 98.3 F (36.8 C) (Oral)   Ht 6' (1.829 m)   Wt 148 lb 6.4 oz (67.3 kg)   SpO2 98%   BMI 20.13 kg/m  Gen: NAD, resting comfortably CV: RRR no murmurs rubs or gallops Lungs: Occasional wheeze.  No rhonchi or crackles.  Prolonged expiratory phase Abdomen: soft/nontender/nondistended/normal bowel sounds. No rebound or guarding.  Ext: no edema Skin: warm, dry, see photos below for numerous wounds.  Deep laceration on right forearm with muscle layer exposed.  No signs of infection at any sites.    Right forearm  Left hand  Right forearm  Left hand  Left leg   Assessment/Plan:   Forearm laceration, right, initial encounter Extensive laceration.  We have referred him to the wound care center primarily for wound on the right forearm but also for help with multiple other wounds.  I would ask their opinion on plastic surgery referral-wonder if patient will eventually need skin graft.  Hopeful this heals by secondary intention.  We redressed all his wounds today before he left.  Essential hypertension We will continue him off his triamterene hydrochlorothiazide.  His blood pressure is controlled today without these medications.  We may need to use low-dose medication in the  future if blood pressure is elevated.  We needed to update a CBC and BMP today due to hyponatremia and anemia in hospital..  On labs-hyponatremia is a resolved, anemia has improved.  COPD (chronic obstructive pulmonary disease) with emphysema (H[facility]) Syncope was thought due to hypoxia.  He is now on chronic oxygen until he follows up with pulmonary.  He has a visit scheduled in June already but he is considering getting a second opinion within [facility].  In the hospital they also mentioned doing plan of pulmonary rehabilitation once his wounds have healed-I think this is reasonable  He does have some increased congestion for him and some mild wheeze.  Usually he doubles prednisone for 4 days per pulmonary and then follows up if he is not improving.  I think this is reasonable.  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  We will get bone density test.  We will start him on calcitonin to see if we can help with pain.  He will need to take calcium and vitamin D as well.  Currently he is using 3 hydrocodone a day and I refilled enough for a week.  Hopefully with calcitonin he will need less pain medicine.  Once we get pain control and have bone density back likely start bisphosphonate.  Wife asks about referral to orthopedics.  Patient states he would not want to have kyphoplasty.  He prefers to try the above so we opted to hold off on orthopedics referral.  Future Appointments  Date Time Provider [place]  11/12/2017  1:30 PM [facility] D[facility] 1 [facility]  11/18/2017 10:45 AM [name], MD [facility]  11/24/2017  8:00 AM [place] [facility]  12/25/2017  8:30 AM [name], MD [facility]  12/30/2017  9:30 AM [place], Rose Fillers, MD [facility] None   Lab/Order  associations: Arm wound, right, initial encounter - Plan: AMB referral to wound care center  Essential hypertension - Plan: CBC, Basic metabolic panel  Closed compression fracture of thoracic vertebra, initial encounter ([place]) - Plan: DG Bone Density  Meds ordered this encounter  Medications  . HYDROcodone-acetaminophen (NORCO/VICODIN) 5-325 MG tablet    Sig: Take 1-2 tablets by mouth every 4 (four) hours as needed for moderate pain.    Dispense:  20 tablet    Refill:  0  . calcitonin, salmon, (MIACALCIN/FORTICAL) 200 UNIT/ACT nasal spray  Sig: Place 1 spray into alternate nostrils daily.    Dispense:  3.7 mL    Refill:  0    Return precautions advised.  Garret Reddish, MD

## 2017-11-11 NOTE — Assessment & Plan Note (Addendum)
Likely osteoporosis related with long-term prednisone use.  We will get bone density test.  We will start him on calcitonin to see if we can help with pain.  He will need to take calcium and vitamin D as well.  Currently he is using 3 hydrocodone a day and I refilled enough for a week if he stretches it to 3x a day after reviewing NCCSRS Hopefully with calcitonin he will need less pain medicine.  Once we get pain control and have bone density back likely start bisphosphonate.  Wife asks about referral to orthopedics.  Patient states he would not want to have kyphoplasty.  He prefers to try the above so we opted to hold off on orthopedics referral.

## 2017-11-11 NOTE — Patient Instructions (Addendum)
We are going to try to get you into the wound care center soon.   Lets check back in 7 days to see how pain is doing. I am going to call you about calcitonin option. Lets hold off on [facility] for now  Schedule your bone density test at check out desk. You may also call directly to X-ray at [phone] to schedule an appointment that is convenient for you.  - located [address] across the [address] from [place] - in the basement - you do need an appointment for the bone density tests.   Contact me when wound care towards the end- we will try to send to pulmonary rehab.   Remain off blood pressure medicine- we will recheck next week  Please stop by lab before you go

## 2017-11-11 NOTE — Telephone Encounter (Signed)
Copied from [place] [id number]. Topic: Quick Communication - See Telephone Encounter >> Nov 11, 2017  4:26 PM Vernona Rieger wrote: CRM for notification. See Telephone encounter for: 11/11/17.  Patient's wife called and said that they were in the office today. She said that she thought Dr Yong Channel mentioned that he was going to call a " spray " for pain in for him. She said she didn't see it on the AVS and the pharmacy did not have anything. Please advise.  Call back is [phone]

## 2017-11-11 NOTE — Assessment & Plan Note (Signed)
We will continue him off his triamterene hydrochlorothiazide.  His blood pressure is controlled today without these medications.  We may need to use low-dose medication in the future if blood pressure is elevated.  We needed to update a CBC and BMP today due to hyponatremia and anemia in hospital..  On labs-hyponatremia is a resolved, anemia has improved.

## 2017-11-12 ENCOUNTER — Telehealth: Payer: Self-pay

## 2017-11-12 ENCOUNTER — Telehealth: Payer: Self-pay | Admitting: Family Medicine

## 2017-11-12 ENCOUNTER — Ambulatory Visit (INDEPENDENT_AMBULATORY_CARE_PROVIDER_SITE_OTHER)
Admission: RE | Admit: 2017-11-12 | Discharge: 2017-11-12 | Disposition: A | Discharge status: Home / Self Care | Payer: Medicare Other | Source: Ambulatory Visit | Attending: Family Medicine | Admitting: Family Medicine

## 2017-11-12 DIAGNOSIS — S22000A Wedge compression fracture of unspecified thoracic vertebra, initial encounter for closed fracture: Secondary | ICD-10-CM

## 2017-11-12 NOTE — Telephone Encounter (Signed)
Copied from [place] [id number]. Topic: Quick Communication - See Telephone Encounter >> Nov 12, 2017  4:38 PM Neva Seat wrote: Janeal Holmes [place] [phone]  Called to give update on pt from recent visit.  Pt's leg is swollen and now weeping. Please call [name], RN to discuss pt care.

## 2017-11-12 NOTE — Progress Notes (Signed)
Called pt and advised. Also sent note through MyChart as pt was in the car when we spoke.

## 2017-11-12 NOTE — Telephone Encounter (Signed)
Called patient and left a message to call office back.  

## 2017-11-13 ENCOUNTER — Telehealth: Payer: Self-pay

## 2017-11-13 NOTE — Telephone Encounter (Signed)
Called patient and gave the results of his bone density test. Patient verbalized understanding.

## 2017-11-13 NOTE — Telephone Encounter (Signed)
calcitonin, salmon, (MIACALCIN/FORTICAL) 200 UNIT/ACT nasal spray   This was sent to the pharmacy on 11/11/2017. I will call the pharmacy and confirm they received the prescription

## 2017-11-13 NOTE — Telephone Encounter (Signed)
Called and confirmed that patient picked this up yesterday

## 2017-11-13 NOTE — Telephone Encounter (Signed)
Left message to return call to our office.  

## 2017-11-13 NOTE — Progress Notes (Signed)
Bone density test does show osteoporosis.  We will need to start you on a different medicine for osteoporosis once your pain becomes controlled

## 2017-11-14 ENCOUNTER — Ambulatory Visit (INDEPENDENT_AMBULATORY_CARE_PROVIDER_SITE_OTHER): Payer: Medicare Other | Admitting: Family Medicine

## 2017-11-14 ENCOUNTER — Telehealth: Payer: Self-pay | Admitting: *Deleted

## 2017-11-14 ENCOUNTER — Encounter: Payer: Self-pay | Admitting: Family Medicine

## 2017-11-14 VITALS — BP 126/80 | HR 100 | Temp 97.6°F | Ht 72.0 in | Wt 146.4 lb

## 2017-11-14 DIAGNOSIS — L03116 Cellulitis of left lower limb: Secondary | ICD-10-CM

## 2017-11-14 MED ORDER — FUROSEMIDE 20 MG PO TABS: 20.0000 mg | ORAL_TABLET | Freq: Every day | ORAL | 0 refills | Status: DC | PRN

## 2017-11-14 MED ORDER — CEPHALEXIN 500 MG PO CAPS: 500.0000 mg | ORAL_CAPSULE | Freq: Three times a day (TID) | ORAL | 0 refills | Status: DC

## 2017-11-14 MED ORDER — DOXYCYCLINE HYCLATE 100 MG PO TABS: 100.0000 mg | ORAL_TABLET | Freq: Two times a day (BID) | ORAL | 0 refills | Status: DC

## 2017-11-14 NOTE — Telephone Encounter (Signed)
great thanks!

## 2017-11-14 NOTE — Addendum Note (Signed)
Addended by: [name] on: 11/14/2017 03:58 PM   Modules accepted: Orders

## 2017-11-14 NOTE — Telephone Encounter (Signed)
Spoke with [name]. She states patient had a bone density done on Wednesday and they strapped him tot the table. Right leg began weeping. Right foot is 2+ pitting edema. Foot is swollen and red. Today left foot is also 2+ pitting edema, very red, warm to touch. Patient can walk but very uncomfortable. Complains of chills. No fever noted. I am scheduling patient for 3:00 today with Dr. Yong Channel.

## 2017-11-14 NOTE — Telephone Encounter (Signed)
Called Collie Siad with [facility] to give verbal orders but she did not answer. I left a voicemail to call office back.

## 2017-11-14 NOTE — Patient Instructions (Signed)
I agree with home health- physical exam and history concerning for cellulitis of the left leg.  There is a raised area that feels like it may have some fluctuance- possible abscess but no clear head to this.  I think we should cover him for MRSA just in case.  We will treat with doxycycline for MRSA but also Keflex for better strep coverage.  We discussed he should proceed immediately to the hospital if he has fever above 100.5, worsening chills, expanding redness or worsening pain despite antibiotics.  We discussed redness should stabilize within 24 hours and hopefully improve slightly within 48 hours.  If redness and pain or worsening at 48 hours he should be seen in the hospital.  He should also elevate the legs as much as possible to help keep fluid out. Would love compression stockings or ace wrap but I dont think you can tolerate that right now.

## 2017-11-14 NOTE — Progress Notes (Addendum)
Subjective:  Francisco [name] is a 72 y.o. year old very pleasant male patient who presents for/with See problem oriented charting ROS- no fever. Some chills. No nausea or vomiting. Does not feel ill overall but does have trouble staying warm   Past Medical History-  Patient Active Problem List   Diagnosis Date Noted  . Thoracic compression fracture ([place]) 11/02/2017    Priority: High  . TOBACCO USE 08/23/2008    Priority: High  . COPD (chronic obstructive pulmonary disease) with emphysema ([place]) 08/23/2008    Priority: High  . BPH associated with nocturia 06/25/2017    Priority: Medium  . Hyperglycemia 06/12/2016    Priority: Medium  . Hyperlipidemia 07/22/2014    Priority: Medium  . Onychomycosis 07/22/2014    Priority: Low  . Left ankle swelling 07/22/2014    Priority: Low  . History of skin cancer 05/17/2014    Priority: Low  . Chronic rhinitis 10/28/2012    Priority: Low  . Pulmonary nodule 09/23/2011    Priority: Low  . History of colonic polyps 08/23/2008    Priority: Low  . Syncope 11/02/2017  . Essential hypertension 11/02/2017  . Venous stasis dermatitis of both lower extremities 11/02/2017  . Forearm laceration, right, initial encounter 11/02/2017  . Chronic respiratory failure with hypoxia ([place]) 09/23/2017  . Pleuritic pain 07/17/2017  . Leukocytosis 12/19/2016    Medications- reviewed and updated Current Outpatient Medications  Medication Sig Dispense Refill  . albuterol (VENTOLIN HFA) 108 (90 Base) MCG/ACT inhaler USE 2 PUFFS EVERY 6 HOURS  AS NEEDED FOR WHEEZING 3 Inhaler 3  . azithromycin (ZITHROMAX) 250 MG tablet TAKE 1 TABLET BY MOUTH ON  MONDAY, WEDNESDAY, AND  FRIDAY 39 tablet 1  . Bismuth Tribromoph-Petrolatum (XEROFORM PETROLAT GAUZE 5"X9") MISC Use on wounds as directed at hospital 50 each 5  . calcitonin, salmon, (MIACALCIN/FORTICAL) 200 UNIT/ACT nasal spray Place 1 spray into alternate nostrils daily. 3.7 mL 0  . cephALEXin (KEFLEX) 500 MG  capsule Take 1 capsule (500 mg total) by mouth 3 (three) times daily for 10 days. 30 capsule 0  . doxycycline (VIBRA-TABS) 100 MG tablet Take 1 tablet (100 mg total) by mouth 2 (two) times daily. 20 tablet 0  . fluticasone (FLONASE) 50 MCG/ACT nasal spray USE 2 SPRAYS IN EACH  NOSTRIL DAILY 48 g 1  . furosemide (LASIX) 20 MG tablet Take 1 tablet (20 mg total) by mouth daily as needed for edema. 30 tablet 0  . Gauze Pads & Dressings (KERLIX GAUZE ROLL LARGE) MISC secure all dressed wounds with Kerlix roll gauze/paper tape 100 each 5  . HYDROcodone-acetaminophen (NORCO/VICODIN) 5-325 MG tablet Take 1-2 tablets by mouth every 4 (four) hours as needed for moderate pain. 20 tablet 0  . Misc. Devices (PULSE OXIMETER FOR FINGER) MISC 1 Device by Does not apply route as needed. 1 each 0  . montelukast (SINGULAIR) 10 MG tablet TAKE 1 TABLET BY MOUTH  EVERY NIGHT AT BEDTIME 90 tablet 3  . OXYGEN Inhale 2 L into the lungs continuous.    . predniSONE (DELTASONE) 10 MG tablet TAKE 1 TABLET BY MOUTH  DAILY WITH BREAKFAST 90 tablet 3  . SPIRIVA RESPIMAT 2.5 MCG/ACT AERS USE 2 SPRAYS (INHALATIONS)  ONCE DAILY 12 g 1  . SYMBICORT 160-4.5 MCG/ACT inhaler USE 2 PUFFS BY MOUTH TWO  TIMES DAILY 3 Inhaler 1   No current facility-administered medications for this visit.     Objective: BP 126/80 (BP Location: Left Arm, Patient Position:  Sitting, Cuff Size: Normal)   Pulse 100   Temp 97.6 F (36.4 C) (Oral)   Ht 6' (1.829 m)   Wt 146 lb 6.4 oz (66.4 kg)   SpO2 96%   BMI 19.86 kg/m  Gen: NAD, resting comfortably CV: RRR no murmurs rubs or gallops Lungs: CTAB - no obvious wheeze today Abdomen: soft/nontender/nondistended/normal bowel sounds.  Ext: 1+ edema bilaterally Skin: warm, dry, on left lower leg there is a raised tender area from 0-6 cm on tape below, it is about 4 cm wide. There is erythema surrounding this  And going down into leg. Left leg is warm to touch compared to left leg and area discussed is  particularly painful.   Left leg with edema and some weeping but not warm, tender. Leg does have some redness as the left does.      Assessment/Plan:  Cellulitis  s: Patient had his bone density test done earlier this week.  The straps you seem to irritate his legs.  Since that time- he has noted weeping from the right leg where an area was cut open.  On his left leg on his upper shin he has noted an area of tenderness-around this there is erythema and warmth.  He has warmth through the leg.  He has swelling in both legs more than previous.  Home health nursing saw him today and was concerned about cellulitis in the left leg.  He has had some chills but no fever.  His heart rate is not more elevated than his typical baseline around 100.  His extensive right arm wound per nursing and per wife continues to heal slowly.  There has been no pain in this area or expanding redness.  His legs is is is gone now needs to do a stress to try this from his leg is he was only in my for 1 time there is we did open this up so there is much open spots, she might have A/P: I agree with home health- physical exam and history concerning for cellulitis of the left leg.  There is a raised area that feels like it may have some fluctuance- possible abscess (this are is from 0-6 cm on tape above that is raised and tender) but no clear head to this.  I think we should cover him for MRSA just in case.  We will treat with doxycycline for MRSA but also Keflex for better strep coverage.  We discussed he should proceed immediately to the hospital if he has fever, worsening chills, expanding redness or worsening pain despite antibiotics.  We discussed redness should stabilize within 24 hours and hopefully improve slightly within 48 hours.  If redness and pain or worsening at 48 hours he should be seen in the hospital.  We considered cbc and bmp. Even if had elevated WBC though he would want to trial outpatient therapy first so we  held off. Chills could be from cellulitis- does not appear systemically ill and vitals stable so doubt sepsis.   We have close follow up on Tuesday  We will use lasix daily for 4 days starting tomorrow to help with fluid issues- wife will stay close to help avoid diuretic issues  Future Appointments  Date Time Provider [place]  11/18/2017 10:45 AM [name], MD [facility]  11/24/2017  8:00 AM [place] [facility]  12/25/2017  8:30 AM [name], MD [facility]  12/30/2017  9:30 AM Lam[place] Sakai, Rose Fillers, MD [facility]  None   Return precautions advised.  Garret Reddish, MD

## 2017-11-14 NOTE — Telephone Encounter (Signed)
Francisco [name] from [facility] called back and I gave the verbal orders for patient.

## 2017-11-14 NOTE — Telephone Encounter (Signed)
Copied from [place] [id number]. Topic: Quick Communication - See Telephone Encounter >> Nov 14, 2017  3:49 PM Antonieta Iba C wrote: CRM for notification. See Telephone encounter for: 11/14/17.  Collie Siad with [facility] called in for verbal orders. - [phone]   Frequency: 1 time a week for week 1 and 2 more visits that is every other week

## 2017-11-14 NOTE — Telephone Encounter (Signed)
noted thanks  

## 2017-11-18 ENCOUNTER — Ambulatory Visit (INDEPENDENT_AMBULATORY_CARE_PROVIDER_SITE_OTHER): Payer: Medicare Other | Admitting: Family Medicine

## 2017-11-18 ENCOUNTER — Telehealth: Payer: Self-pay | Admitting: Family Medicine

## 2017-11-18 ENCOUNTER — Encounter: Payer: Self-pay | Admitting: Family Medicine

## 2017-11-18 VITALS — BP 112/68 | HR 114 | Temp 97.7°F | Ht 72.0 in | Wt 142.8 lb

## 2017-11-18 DIAGNOSIS — J438 Other emphysema: Secondary | ICD-10-CM | POA: Diagnosis not present

## 2017-11-18 DIAGNOSIS — S51811A Laceration without foreign body of right forearm, initial encounter: Secondary | ICD-10-CM | POA: Diagnosis not present

## 2017-11-18 DIAGNOSIS — M8000XA Age-related osteoporosis with current pathological fracture, unspecified site, initial encounter for fracture: Secondary | ICD-10-CM | POA: Diagnosis not present

## 2017-11-18 DIAGNOSIS — S22000A Wedge compression fracture of unspecified thoracic vertebra, initial encounter for closed fracture: Secondary | ICD-10-CM

## 2017-11-18 DIAGNOSIS — I10 Essential (primary) hypertension: Secondary | ICD-10-CM | POA: Diagnosis not present

## 2017-11-18 MED ORDER — HYDROCODONE-ACETAMINOPHEN 5-325 MG PO TABS: 1.0000 | ORAL_TABLET | ORAL | 0 refills | Status: DC | PRN

## 2017-11-18 MED ORDER — ALENDRONATE SODIUM 70 MG PO TABS
70.0000 mg | ORAL_TABLET | ORAL | 11 refills | Status: DC
Start: 2017-11-18 — End: 2018-02-10

## 2017-11-18 NOTE — Patient Instructions (Signed)
We will call you within a week about your referral to orthopedics. You can also call yourself and tell them I placed referral today  I may start fosamax- I will let you know in next 2-3 days. If we start this you will stop calcitonin  Sent in more pain medicine  Roselyn Reef will bandage you back up  Call pulmonology for follow up.

## 2017-11-18 NOTE — Telephone Encounter (Signed)
Copied from [place] #[id number]. Topic: Inquiry >> Nov 18, 2017 10:05 AM Lennox Solders wrote: Reason for CRM: tiara lpn adv homecare . [name] is needing order to extend  skill nursing services

## 2017-11-18 NOTE — Progress Notes (Signed)
Subjective:  Francisco [name] is a 72 y.o. year old very pleasant male patient who presents for/with See problem oriented charting ROS- no fever, chills. Redness on legs is improving. Wound on right arm is closing. Some chest congestion reported, some wheeze. Continued back pain   Past Medical History-  Patient Active Problem List   Diagnosis Date Noted  . Thoracic compression fracture ([place]) 11/02/2017    Priority: High  . TOBACCO USE 08/23/2008    Priority: High  . COPD (chronic obstructive pulmonary disease) with emphysema ([place]) 08/23/2008    Priority: High  . BPH associated with nocturia 06/25/2017    Priority: Medium  . Hyperglycemia 06/12/2016    Priority: Medium  . Hyperlipidemia 07/22/2014    Priority: Medium  . Onychomycosis 07/22/2014    Priority: Low  . Left ankle swelling 07/22/2014    Priority: Low  . History of skin cancer 05/17/2014    Priority: Low  . Chronic rhinitis 10/28/2012    Priority: Low  . Pulmonary nodule 09/23/2011    Priority: Low  . History of colonic polyps 08/23/2008    Priority: Low  . Osteoporosis 11/19/2017  . Syncope 11/02/2017  . Essential hypertension 11/02/2017  . Venous stasis dermatitis of both lower extremities 11/02/2017  . Forearm laceration, right, initial encounter 11/02/2017  . Chronic respiratory failure with hypoxia ([place]) 09/23/2017  . Pleuritic pain 07/17/2017  . Leukocytosis 12/19/2016    Medications- reviewed and updated Current Outpatient Medications  Medication Sig Dispense Refill  . albuterol (VENTOLIN HFA) 108 (90 Base) MCG/ACT inhaler USE 2 PUFFS EVERY 6 HOURS  AS NEEDED FOR WHEEZING 3 Inhaler 3  . azithromycin (ZITHROMAX) 250 MG tablet TAKE 1 TABLET BY MOUTH ON  MONDAY, WEDNESDAY, AND  FRIDAY 39 tablet 1  . Bismuth Tribromoph-Petrolatum (XEROFORM PETROLAT GAUZE 5"X9") MISC Use on wounds as directed at hospital 50 each 5  . calcitonin, salmon, (MIACALCIN/FORTICAL) 200 UNIT/ACT nasal spray Place 1 spray into  alternate nostrils daily. 3.7 mL 0  . cephALEXin (KEFLEX) 500 MG capsule Take 1 capsule (500 mg total) by mouth 3 (three) times daily for 10 days. 30 capsule 0  . doxycycline (VIBRA-TABS) 100 MG tablet Take 1 tablet (100 mg total) by mouth 2 (two) times daily. 20 tablet 0  . fluticasone (FLONASE) 50 MCG/ACT nasal spray USE 2 SPRAYS IN EACH  NOSTRIL DAILY 48 g 1  . furosemide (LASIX) 20 MG tablet Take 1 tablet (20 mg total) by mouth daily as needed for edema. 30 tablet 0  . Gauze Pads & Dressings (KERLIX GAUZE ROLL LARGE) MISC secure all dressed wounds with Kerlix roll gauze/paper tape 100 each 5  . HYDROcodone-acetaminophen (NORCO/VICODIN) 5-325 MG tablet Take 1-2 tablets by mouth every 4 (four) hours as needed for moderate pain. 20 tablet 0  . Misc. Devices (PULSE OXIMETER FOR FINGER) MISC 1 Device by Does not apply route as needed. 1 each 0  . montelukast (SINGULAIR) 10 MG tablet TAKE 1 TABLET BY MOUTH  EVERY NIGHT AT BEDTIME 90 tablet 3  . OXYGEN Inhale 2 L into the lungs continuous.    . predniSONE (DELTASONE) 10 MG tablet TAKE 1 TABLET BY MOUTH  DAILY WITH BREAKFAST 90 tablet 3  . SPIRIVA RESPIMAT 2.5 MCG/ACT AERS USE 2 SPRAYS (INHALATIONS)  ONCE DAILY 12 g 1  . SYMBICORT 160-4.5 MCG/ACT inhaler USE 2 PUFFS BY MOUTH TWO  TIMES DAILY 3 Inhaler 1  . alendronate (FOSAMAX) 70 MG tablet Take 1 tablet (70 mg total) by  mouth once a week. Take with a full glass of water on an empty stomach. 5 tablet 11   No current facility-administered medications for this visit.     Objective: BP 112/68 (BP Location: Left Arm, Patient Position: Sitting, Cuff Size: Normal)   Pulse (!) 114   Temp 97.7 F (36.5 C) (Oral)   Ht 6' (1.829 m)   Wt 142 lb 12.8 oz (64.8 kg)   SpO2 95%   BMI 19.37 kg/m  Gen: NAD, resting comfortably CV: slightly tachycardic but regular no murmurs rubs or gallops Lungs: intermittent wheezing. Some rhonchi noted Abdomen: soft/nontender/nondistended Ext: 1+ edema much improved.  Area of erythema on left leg has shrunk substantially- still has slightly elevated 6-7 cm area. No weeping. Right leg slight drainage from one area. Right arm wound has drastically improved- beginning to shrink in size and dry       Assessment/Plan:  Thoracic compression fracture (HCC) S: calcitonin not helping with pain much. Still using hydrocodone 2-3x a day A/P: with continued elevatd pain level will stop calcitonin. We will refer to [facility] for their opinion on management/kyphoplasty. Will go ahead and start fosamax (see osteoporosis)    COPD (chronic obstructive pulmonary disease) with emphysema (HCC) S: breathing improved some on boost of prednisone. He is also on doxycycline and keflex for cellulitis but with some persistent wheeze, chest congestion, sputum production A/P:i advised him to follow up with pulmonary. Home health wants him to have nebulizer at home instead of rescue albuterol- this is reasonable- I am happy to sign this when it comes across our fax machine  Osteoporosis S: 3 compression fractures related to osteoporosis- pain not improving on  calcitonin A/P: start fosamax at this point. Discussed calcium and vitamin d through phone call   Forearm laceration, right, initial encounter Appears to be healing well- he will keep wound care appointment but looks like healing well with great home wound care through secondary intention.   We rewrapped arm- he requested legs to be wrapped as well but I suspect he can stop leg wraps at this point.   Essential hypertension S: controlled on lasix 40mg  daily- though were mainly using for diuresis BP Readings from Last 3 Encounters:  11/18/17 112/68  11/14/17 126/80  11/11/17 132/86  A/P: We discussed blood pressure goal of <140/90. Continue no medications other than prn lasix 2-3 days as needed for increased edema as long as eats an extra banana- if uses more regularly will need to check bmet to monitor  potassium    Future Appointments  Date Time Provider [place]  11/24/2017  8:00 AM [place] [facility]  12/25/2017  8:30 AM [name], MD [facility]  12/30/2017  9:30 AM By[place], Rose Fillers, MD [facility] None    Lab/Order associations: Closed compression fracture of thoracic vertebra, initial encounter [facility]) - Plan: Ambulatory referral to Orthopedics  Meds ordered this encounter  Medications  . HYDROcodone-acetaminophen (NORCO/VICODIN) 5-325 MG tablet    Sig: Take 1-2 tablets by mouth every 4 (four) hours as needed for moderate pain.    Dispense:  20 tablet    Refill:  0  . alendronate (FOSAMAX) 70 MG tablet    Sig: Take 1 tablet (70 mg total) by mouth once a week. Take with a full glass of water on an empty stomach.    Dispense:  5 tablet    Refill:  11    Return precautions advised.  Garret Reddish, MD

## 2017-11-18 NOTE — Telephone Encounter (Signed)
May forward a copy of our note from today if she desires.

## 2017-11-18 NOTE — Telephone Encounter (Signed)
Called and spoke to [name] to extend the skill nursing services. [name] wants [place] to fax over any changes that are made today with patient, if any. [name] stated the fax number should be in patients chart due to her driving at the moment.

## 2017-11-18 NOTE — Telephone Encounter (Signed)
I do recommend he start fosamax. I sent this in for him. Please call to inform and give instructions below- can send to his mychart as well if needed.   At a minimum, recommend 800 IU of vitamin D and 1200mg  of Calcium per day. You can get this with a calcium-vitamin D supplement.  Once you have the above in place, I would start taking fosamax 70mg  once a week.  Administer first thing in the morning and >30 minutes before the first food, beverage (except plain water), or other medication of the day. Do not take with mineral water or with other beverages. Stay upright (not to lie down) for at least 30 minutes after taking medicine and until after first food of the day (to reduce irritation). Must be taken with 6 to 8 oz of plain water. The tablet should be swallowed whole; do not chew or suck.

## 2017-11-19 DIAGNOSIS — M810 Age-related osteoporosis without current pathological fracture: Secondary | ICD-10-CM | POA: Insufficient documentation

## 2017-11-19 NOTE — Telephone Encounter (Signed)
Office notes have been faxed to [employer]

## 2017-11-19 NOTE — Assessment & Plan Note (Addendum)
S: calcitonin not helping with pain much. Still using hydrocodone 2-3x a day A/P: with continued elevatd pain level will stop calcitonin. We will refer to [facility] for their opinion on management/kyphoplasty. Will go ahead and start fosamax (see osteoporosis)   I did refill hydrocodone another #20 for acute pain from fracture. If continues past 3 months would need to do pain contract but dont suspect it will

## 2017-11-19 NOTE — Telephone Encounter (Signed)
Called and spoke to patients wife and explained to her the directions given. Patient wife verbalized understanding.

## 2017-11-19 NOTE — Assessment & Plan Note (Signed)
S: controlled on lasix 40mg  daily- though were mainly using for diuresis BP Readings from Last 3 Encounters:  11/18/17 112/68  11/14/17 126/80  11/11/17 132/86  A/P: We discussed blood pressure goal of <140/90. Continue no medications other than prn lasix 2-3 days as needed for increased edema as long as eats an extra banana- if uses more regularly will need to check bmet to monitor potassium

## 2017-11-19 NOTE — Telephone Encounter (Signed)
Noted  

## 2017-11-19 NOTE — Assessment & Plan Note (Signed)
S: 3 compression fractures related to osteoporosis- pain not improving on  calcitonin A/P: start fosamax at this point. Discussed calcium and vitamin d through phone call

## 2017-11-19 NOTE — Assessment & Plan Note (Addendum)
S: breathing improved some on boost of prednisone. He is also on doxycycline and keflex for cellulitis but with some persistent wheeze, chest congestion, sputum production A/P:i advised him to follow up with pulmonary. Home health wants him to have nebulizer at home instead of rescue albuterol- this is reasonable- I am happy to sign this when it comes across our fax machine

## 2017-11-19 NOTE — Assessment & Plan Note (Signed)
Appears to be healing well- he will keep wound care appointment but looks like healing well with great home wound care through secondary [facility].   We rewrapped arm- he requested legs to be wrapped as well but I suspect he can stop leg wraps at this point.

## 2017-11-21 ENCOUNTER — Telehealth: Payer: Self-pay | Admitting: Emergency Medicine

## 2017-11-21 NOTE — Telephone Encounter (Signed)
Called and spoke with patient, he states that he would like to switch from [facility] as his primary pulmonologist to [facility]. Patient states his son is a patient of [facility] and he would like to be as well.   [facility] are you ok with taking on this patient, please advise.   RB are you ok with patient switching, please advise.

## 2017-11-21 NOTE — Telephone Encounter (Signed)
Yes that is fine. First avail 30 min slot (non ILD clinic)

## 2017-11-21 NOTE — Telephone Encounter (Signed)
RB please advise if this is okay with you. Thanks!

## 2017-11-24 ENCOUNTER — Encounter (HOSPITAL_BASED_OUTPATIENT_CLINIC_OR_DEPARTMENT_OTHER): Payer: Medicare Other | Attending: Internal Medicine

## 2017-11-24 DIAGNOSIS — Z9981 Dependence on supplemental oxygen: Secondary | ICD-10-CM | POA: Diagnosis not present

## 2017-11-24 DIAGNOSIS — J449 Chronic obstructive pulmonary disease, unspecified: Secondary | ICD-10-CM | POA: Insufficient documentation

## 2017-11-24 DIAGNOSIS — L97211 Non-pressure chronic ulcer of right calf limited to breakdown of skin: Secondary | ICD-10-CM | POA: Insufficient documentation

## 2017-11-24 DIAGNOSIS — Z7952 Long term (current) use of systemic steroids: Secondary | ICD-10-CM | POA: Insufficient documentation

## 2017-11-24 DIAGNOSIS — Z87891 Personal history of nicotine dependence: Secondary | ICD-10-CM | POA: Insufficient documentation

## 2017-11-24 DIAGNOSIS — I87323 Chronic venous hypertension (idiopathic) with inflammation of bilateral lower extremity: Secondary | ICD-10-CM | POA: Diagnosis not present

## 2017-11-24 DIAGNOSIS — I10 Essential (primary) hypertension: Secondary | ICD-10-CM | POA: Diagnosis not present

## 2017-11-24 DIAGNOSIS — W1830XA Fall on same level, unspecified, initial encounter: Secondary | ICD-10-CM | POA: Diagnosis not present

## 2017-11-24 DIAGNOSIS — S51811A Laceration without foreign body of right forearm, initial encounter: Secondary | ICD-10-CM | POA: Diagnosis not present

## 2017-11-24 DIAGNOSIS — I890 Lymphedema, not elsewhere classified: Secondary | ICD-10-CM | POA: Insufficient documentation

## 2017-11-24 NOTE — Telephone Encounter (Signed)
Yes this is Ok with me

## 2017-11-24 NOTE — Telephone Encounter (Signed)
Patient returned call.  Per notes appt was scheduled for 30 min slot with [name] on 05/31.  Patient is aware and no call back is needed.

## 2017-11-24 NOTE — Telephone Encounter (Signed)
ATC pt, no answer. Left message for pt to call back.  

## 2017-11-28 ENCOUNTER — Encounter: Payer: Self-pay | Admitting: Family Medicine

## 2017-11-28 NOTE — Telephone Encounter (Signed)
Relation to pt: self Call back [place]: [id number] - [place], [place] [phone] (Phone) [phone] (Fax)    Reason for call:  Patient was seen by Johnn Hai, MD from [facility] orthopedic yesterday for compression fracture and specialist advised to d/c alendronate (FOSAMAX) 70 MG tablet and start taking back  calcitonin, salmon, (MIACALCIN/FORTICAL) 200 UNIT/ACT nasal spray, patient wanted to know PCP thoughts regarding taking calcitionin, please advise

## 2017-11-28 NOTE — Telephone Encounter (Signed)
See note

## 2017-11-28 NOTE — Telephone Encounter (Signed)
If that is his recommendation- I would support that for now as long as he knows that you were on calcitonin previously

## 2017-11-28 NOTE — Telephone Encounter (Signed)
Spoke with wife who verbalized understanding

## 2017-12-02 ENCOUNTER — Telehealth: Payer: Self-pay | Admitting: Family Medicine

## 2017-12-02 NOTE — Telephone Encounter (Signed)
Spoke with [name] who is asking if it is ok that Francisco [name] take the Lasix? She went out his morning and states his legs are 2+ pitting edema and red. Please advise.

## 2017-12-02 NOTE — Telephone Encounter (Signed)
Copied from [place] [id number]. Topic: Quick Communication - See Telephone Encounter >> Dec 02, 2017  1:42 PM Aurelio Brash B wrote: CRM for notification. See Telephone encounter for: 12/02/17.  [name] from [facility]   call back numer  [phone]      Wants to know if pt  is suppose to be taking  lasix  for edema to lower extremities

## 2017-12-02 NOTE — Telephone Encounter (Signed)
See note

## 2017-12-02 NOTE — Telephone Encounter (Signed)
From last note "Continue no medications other than prn lasix 2-3 days as needed for increased edema as long as eats an extra banana- if uses more regularly will need to check bmet to monitor potassium"  If he is using more regularly we probably need to see him back to reevalute sooner than June 20th

## 2017-12-03 NOTE — Telephone Encounter (Signed)
Called and spoke to [place] and let her know. She verbalized understanding

## 2017-12-05 ENCOUNTER — Ambulatory Visit (HOSPITAL_COMMUNITY)
Admission: RE | Admit: 2017-12-05 | Discharge: 2017-12-05 | Disposition: A | Discharge status: Home / Self Care | Payer: Medicare Other | Source: Ambulatory Visit | Attending: Internal Medicine | Admitting: Internal Medicine

## 2017-12-05 ENCOUNTER — Telehealth: Payer: Self-pay | Admitting: Internal Medicine

## 2017-12-05 ENCOUNTER — Ambulatory Visit (INDEPENDENT_AMBULATORY_CARE_PROVIDER_SITE_OTHER): Payer: Medicare Other | Admitting: Internal Medicine

## 2017-12-05 ENCOUNTER — Encounter: Payer: Self-pay | Admitting: Internal Medicine

## 2017-12-05 VITALS — BP 126/78 | HR 97 | Ht 72.0 in | Wt 142.2 lb

## 2017-12-05 DIAGNOSIS — J9611 Chronic respiratory failure with hypoxia: Secondary | ICD-10-CM

## 2017-12-05 DIAGNOSIS — J449 Chronic obstructive pulmonary disease, unspecified: Secondary | ICD-10-CM

## 2017-12-05 DIAGNOSIS — Z87891 Personal history of nicotine dependence: Secondary | ICD-10-CM | POA: Diagnosis not present

## 2017-12-05 LAB — BLOOD GAS, ARTERIAL
ACID-BASE EXCESS: 3 mmol/L — AB (ref 0.0–2.0)
Bicarbonate: 27.1 mmol/L (ref 20.0–28.0)
Drawn by: 270521
O2 Content: 2 L/min
O2 Saturation: 96.9 %
PH ART: 7.42 (ref 7.350–7.450)
Patient temperature: 98.6
pCO2 arterial: 42.6 mmHg (ref 32.0–48.0)
pO2, Arterial: 92 mmHg (ref 83.0–108.0)

## 2017-12-05 NOTE — Telephone Encounter (Signed)
Recent Labs  Lab 12/05/17 1020  PHART 7.420  PCO2ART 42.6  PO2ART 92.0  HCO3 27.1  O2SAT 96.9    No PCO2 elevation in ABG. No role for bipap

## 2017-12-05 NOTE — Progress Notes (Signed)
Subjective:     Patient ID: Francisco [name], male   DOB: [birth year], 72 y.o.   MRN: [id number]  HPI  ROV 12/30/16 -- patient has a history of tobacco use, COPD currently maintained on chronic prednisone, Symbicort, Spiriva. Most recent blurry function testing was March 2011 with an FEV1 of 1.2 L (34% Pred), severe obstruction. He up titrates his prednisone on his own based on symptoms. No abx since last time. He has increased pred temporrily about 4 -5 x since last time. No increases for over a month. He is smoking a pipe, not cigarettes. He is using HCTZ more frequently these days. He is on flonase and singulair. He does note that he has some slow progression of his DOE. He coughs a few times a day, clears clear sputum.   rov 07/17/17 --this is a follow-up visit for patient with active tobacco use, severe COPD and chronic prednisone use.  He often titrates his prednisone on his own depending on how he is feeling. He complains of a new pain in his back below his left shoulder blade, has been present for the last 10 days. sometimes pleuritic, can be stabbing, worse w cough. His pred use: on 10mg  for the last 19 days. No hx VTE. He has LE edema, venous stasis changes, increased since his diuretics decreased.   ROV 09/23/17 --72 year old man with a history of tobacco use (still smokes pipe), severe COPD and severe obstruction on spirometry.  At his last visit he had some pleuritic left back pain and I performed a CT PA that I have reviewed from 07/17/17.  There was no pulmonary embolism but extensive emphysema present, some bilateral apical scar more so on the right, stable.  He uses prednisone 10 mg daily and uptitrate depending on his day-to-day symptoms.  He is otherwise managed on Symbicort and Spiriva.  Desaturated on arrival today after ambulating to the exam room. He reports more dyspnea, has increased pred intermittently.    OV 12/05/2017  Chief Complaint  Patient presents with  . Follow-up     Switching from RB to MR.   Pt is on 3L with exertion and 2L at rest. Pt states his breathing had become worse but since he has been on O2 since 3/21, it has really helped with his breathing.  Pt was in hospital 4/28-5/3. Other than SOB, pt does have c/o cough, rattling in chest. Denies any CP/chest tightness.   History from patient, his wife and review of the old chart  Francisco Coventry IIIs a transfer of care from [name] to myself [name].  His son is my patient and therefore he is done this transfer of care.  According to the patient he is to be seen by [name] for COPD.  Patient is FEV1 34% based on March 2011 PFTs according to chart review.  At baseline he is maintained on Spiriva, Symbicort, Singulair, chronic daily prednisone 10 mg/day for at least 3 years, and 3 times a week of azithromycin for 2 years.  Earlier this year in March 2019 he went on daytime oxygen and following a recent hospitalization April-May 2019.  For syncope that was believed due to nocturnal hypoxemia he was started on night oxygen.  Since starting oxygen his edema has improved and his overall quality of life is improved and he stopped having any orthostasis or presyncopal episodes.  He is grateful for the fact that he is on oxygen.  His current COPD CAT score and severity of  symptoms is rated below and is 26.  Show significant amount of symptom burden.  His main goal is to improve his quality of life.  He had his wife have many questions centering around quality of life, medication therapy.  Of note he has had some thoracic vertebral fractures that are new.  This happened after the fall in late April 2019.  Discovered at admission last month.  He feels is due to prednisone.  He is wondering about portable oxygen.   CAT COPD Symptom & Quality of Life Score (GSK trademark) 0 is no burden. 5 is highest burden 12/05/2017   Never Cough -> Cough all the time 3  No phlegm in chest -> Chest is full of phlegm 2  No chest  tightness -> Chest feels very tight 1  No dyspnea for 1 flight stairs/hill -> Very dyspneic for 1 flight of stairs 5  No limitations for ADL at home -> Very limited with ADL at home 5  Confident leaving home -> Not at all confident leaving home 3  Sleep soundly -> Do not sleep soundly because of lung condition 3  Lots of Energy -> No energy at all 4  TOTAL Score (max 40)  26       Results for Francisco, [name] (MRN [id number]) as of 12/05/2017 09:07  Ref. Range 11/11/2017 13:11  Hemoglobin Latest Ref Range: 13.0 - 17.0 g/dL 11.8 (L)   Results for Francisco, [name] (MRN [id number]) as of 12/05/2017 09:07  Ref. Range 11/03/2017 01:14 11/03/2017 09:40 11/04/2017 04:49 11/05/2017 03:40 11/11/2017 13:11  CO2 Latest Ref Range: 19 - 32 mEq/L 26 26 27 30 30   Results for Francisco, [name] (MRN [id number]) as of 12/05/2017 09:07  Ref. Range 11/11/2017 13:11  Creatinine Latest Ref Range: 0.40 - 1.50 mg/dL 0.71    has a past medical history of Chest x-ray abnormality, Chronic rhinitis, COPD (chronic obstructive pulmonary disease) ([place]), and Onychomycosis.   reports that he has quit smoking. His smoking use included pipe and cigarettes. He started smoking about 1 months ago. He has a 20.00 pack-year smoking history. He has never used smokeless tobacco.  Past Surgical History:  Procedure Laterality Date  . APPENDECTOMY    . TONSILLECTOMY      Allergies  Allergen Reactions  . Tape Other (See Comments)    SKIN IS VERY THIN; CAN ONLY USE COBAN WRAPS DUE TO CONDITION OF SKIN!!    Immunization History  Administered Date(s) Administered  . Influenza Split 04/24/2012  . Influenza Whole 05/08/2009, 04/08/2011  . Influenza, High Dose Seasonal PF 05/01/2017  . Influenza,inj,Quad PF,6+ Mos 04/27/2013, 04/15/2014, 04/24/2015  . Influenza-Unspecified 05/13/2016  . Pneumococcal Conjugate-13 02/25/2014  . Pneumococcal Polysaccharide-23 08/15/2011, 12/19/2016  . Td 02/21/2010  . Tdap 11/05/2017  . Zoster  01/29/2012    Family History  Problem Relation Age of Onset  . Heart disease Mother        CABG in her 86s, nonsmoker  . Cancer Mother   . Stroke Father   . Heart disease Father        Died of MI at age 51, smoker  . Hepatitis Sister   . Coronary artery disease Other        male 1st degree relative <60     Current Outpatient Medications:  .  albuterol (VENTOLIN HFA) 108 (90 Base) MCG/ACT inhaler, USE 2 PUFFS EVERY 6 HOURS  AS NEEDED FOR WHEEZING, Disp: 3 Inhaler, Rfl: 3 .  azithromycin (ZITHROMAX)  250 MG tablet, TAKE 1 TABLET BY MOUTH ON  MONDAY, WEDNESDAY, AND  FRIDAY, Disp: 39 tablet, Rfl: 1 .  Bismuth Tribromoph-Petrolatum (XEROFORM PETROLAT GAUZE 5"X9") MISC, Use on wounds as directed at hospital, Disp: 50 each, Rfl: 5 .  calcitonin, salmon, (MIACALCIN/FORTICAL) 200 UNIT/ACT nasal spray, Place 1 spray into alternate nostrils daily., Disp: 3.7 mL, Rfl: 0 .  Calcium Carbonate-Vit D-Min (CALCIUM 1200 PO), Take 1,200 mg by mouth daily., Disp: , Rfl:  .  Cholecalciferol (VITAMIN D3) 1000 units CAPS, Take 1,000 Units by mouth daily., Disp: , Rfl:  .  fluticasone (FLONASE) 50 MCG/ACT nasal spray, USE 2 SPRAYS IN EACH  NOSTRIL DAILY, Disp: 48 g, Rfl: 1 .  furosemide (LASIX) 20 MG tablet, Take 1 tablet (20 mg total) by mouth daily as needed for edema., Disp: 30 tablet, Rfl: 0 .  Gauze Pads & Dressings (KERLIX GAUZE ROLL LARGE) MISC, secure all dressed wounds with Kerlix roll gauze/paper tape, Disp: 100 each, Rfl: 5 .  HYDROcodone-acetaminophen (NORCO/VICODIN) 5-325 MG tablet, Take 1-2 tablets by mouth every 4 (four) hours as needed for moderate pain., Disp: 20 tablet, Rfl: 0 .  Misc. Devices (PULSE OXIMETER FOR FINGER) MISC, 1 Device by Does not apply route as needed., Disp: 1 each, Rfl: 0 .  montelukast (SINGULAIR) 10 MG tablet, TAKE 1 TABLET BY MOUTH  EVERY NIGHT AT BEDTIME, Disp: 90 tablet, Rfl: 3 .  OXYGEN, Inhale 2 L into the lungs continuous. 2L O2 at rest and 3L O2 with exertion.,  Disp: , Rfl:  .  predniSONE (DELTASONE) 10 MG tablet, TAKE 1 TABLET BY MOUTH  DAILY WITH BREAKFAST, Disp: 90 tablet, Rfl: 3 .  SPIRIVA RESPIMAT 2.5 MCG/ACT AERS, USE 2 SPRAYS (INHALATIONS)  ONCE DAILY, Disp: 12 g, Rfl: 1 .  SYMBICORT 160-4.5 MCG/ACT inhaler, USE 2 PUFFS BY MOUTH TWO  TIMES DAILY, Disp: 3 Inhaler, Rfl: 1 .  terbinafine (LAMISIL) 250 MG tablet, terbinafine HCl 250 mg tablet, Disp: , Rfl:  .  alendronate (FOSAMAX) 70 MG tablet, Take 1 tablet (70 mg total) by mouth once a week. Take with a full glass of water on an empty stomach. (Patient not taking: Reported on 12/05/2017), Disp: 5 tablet, Rfl: 11   Review of Systems     Objective:   Physical Exam  Constitutional: He is oriented to person, place, and time. He appears well-developed and well-nourished. No distress.  thin  HENT:  Head: Normocephalic and atraumatic.  Right Ear: External ear normal.  Left Ear: External ear normal.  Mouth/Throat: Oropharynx is clear and moist. No oropharyngeal exudate.  Eyes: Pupils are equal, round, and reactive to light. Conjunctivae and EOM are normal. Right eye exhibits no discharge. Left eye exhibits no discharge. No scleral icterus.  Neck: Normal range of motion. Neck supple. No JVD present. No tracheal deviation present. No thyromegaly present.  Cardiovascular: Normal rate, regular rhythm and intact distal pulses. Exam reveals no gallop and no friction rub.  No murmur heard. Pulmonary/Chest: Effort normal and breath sounds normal. No respiratory distress. He has no wheezes. He has no rales. He exhibits no tenderness.  barrell chst kyphotic  Abdominal: Soft. Bowel sounds are normal. He exhibits no distension and no mass. There is no tenderness. There is no rebound and no guarding.  Musculoskeletal: Normal range of motion. He exhibits edema. He exhibits no tenderness.  Tremor + Chronic pedal edema + with stasis dermatitis +  Lymphadenopathy:    He has no cervical adenopathy.   Neurological: He is  alert and oriented to person, place, and time. He has normal reflexes. No cranial nerve deficit. Coordination normal.  Skin: Skin is warm and dry. No rash noted. He is not diaphoretic. No erythema. No pallor.  Prednisone bruises on skin on forearm  Psychiatric: He has a normal mood and affect. His behavior is normal. Judgment and thought content normal.  Nursing note and vitals reviewed.  Today's Vitals   12/05/17 0902  BP: 126/78  Pulse: 97  SpO2: 97%  Weight: 142 lb 3.2 oz (64.5 kg)  Height: 6' (1.829 m)    Estimated body mass index is 19.29 kg/m as calculated from the following:   Height as of this encounter: 6' (1.829 m).   Weight as of this encounter: 142 lb 3.2 oz (64.5 kg).     Assessment:       ICD-10-CM   1. Chronic respiratory failure with hypoxia (HCC) J96.11   2. Stage 3 severe COPD by GOLD classification ([place]) J44.9   3. Quit smoking Z87.891        Plan:      Currently disease is "chronic stable" Glad you are better after starting night and day o2 after recent hospitalization Yes prednisone is weighing in on your osteoporosis   - glad Dr [name] and PCP [name], MD are addressing it Understand you aret interested in optimizing quality of life and are interested in portable o2 system Not fully grasping being cold Understand you want to hold off rehab due to recent T spine fracture Congratulations on quitting smoking  PLAN  - meet with [facility] to discuss portable o2 option - use o2 2L [place] at night and 2L [place] day time at rest and 3L [place] with exertion - check ABG to look for high co2 - if high we can entertain night bipap - continue symbicort and spiriva scheduled   - continue azithromycin 3 times a week  - continue chronic daily prednisone  - use albuterol as needed - Please talk to PCP [name], MD -  and ensure you get  shingarix vaccine - Please discuss with PCP about why you feel cold - check Vit D and TSH with  PCP   Followup - 8 weeks to see Dr [name]; CAT score at followup  - can discuss rehab at followup     > 50% of this > 25 min visit spent in face to face counseling or coordination of care    Dr. Brand Males, M.D., [facility].C.P Pulmonary and Critical Care Medicine Staff Physician, [place] Director - Interstitial Lung Disease  Program  Pulmonary [facility] [place], [place], [address]  Pager: [phone], If no answer or between  15:00h - 7:00h: call [phone] Telephone: [phone]

## 2017-12-05 NOTE — Patient Instructions (Addendum)
ICD-10-CM   1. Chronic respiratory failure with hypoxia (HCC) J96.11   2. Stage 3 severe COPD by GOLD classification ([place]) J44.9   3. Quit smoking Z87.891     Currently disease is "chronic stable" Glad you are better after starting night and day o2 after recent hospitalization Yes prednisone is weighing in on your osteoporosis   - glad Dr [name] and PCP [name], MD are addressing it Understand you aret interested in optimizing quality of life and are interested in portable o2 system Not fully grasping being cold Understand you want to hold off rehab due to recent T spine fracture Congratulations on quitting smoking  PLAN  - meet with [facility] to discuss portable o2 option - use o2 2L [place] at night and 2L [place] day time at rest and 3L [place] with exertion - check ABG to look for high co2 - if high we can entertain night bipap - will call with results - continue symbicort and spiriva scheduled   - continue azithromycin 3 times a week  - continue chronic daily prednisone  - use albuterol as needed - Please talk to PCP [name], MD -  and ensure you get  shingarix vaccine - Please discuss with PCP about why you feel cold - check Vit D and TSH with PCP   Followup - 8 weeks to see Dr [name]; CAT score at followup  - can discuss rehab at followup

## 2017-12-08 ENCOUNTER — Encounter (HOSPITAL_BASED_OUTPATIENT_CLINIC_OR_DEPARTMENT_OTHER): Payer: Medicare Other | Attending: Internal Medicine

## 2017-12-08 DIAGNOSIS — I890 Lymphedema, not elsewhere classified: Secondary | ICD-10-CM | POA: Insufficient documentation

## 2017-12-08 DIAGNOSIS — W19XXXA Unspecified fall, initial encounter: Secondary | ICD-10-CM | POA: Insufficient documentation

## 2017-12-08 DIAGNOSIS — I10 Essential (primary) hypertension: Secondary | ICD-10-CM | POA: Insufficient documentation

## 2017-12-08 DIAGNOSIS — J449 Chronic obstructive pulmonary disease, unspecified: Secondary | ICD-10-CM | POA: Diagnosis not present

## 2017-12-08 DIAGNOSIS — S51811A Laceration without foreign body of right forearm, initial encounter: Secondary | ICD-10-CM | POA: Insufficient documentation

## 2017-12-08 DIAGNOSIS — Z9981 Dependence on supplemental oxygen: Secondary | ICD-10-CM | POA: Diagnosis not present

## 2017-12-09 LAB — HM DIABETES EYE EXAM

## 2017-12-09 NOTE — Telephone Encounter (Signed)
Called and spoke with pt's spouse, Katy Apo letting her know the results of pt's abg results.  Lillie expressed understanding. Nothing further needed at this time.

## 2017-12-10 ENCOUNTER — Other Ambulatory Visit: Payer: Self-pay | Admitting: Family Medicine

## 2017-12-22 DIAGNOSIS — S51811A Laceration without foreign body of right forearm, initial encounter: Secondary | ICD-10-CM | POA: Diagnosis not present

## 2017-12-25 ENCOUNTER — Ambulatory Visit (INDEPENDENT_AMBULATORY_CARE_PROVIDER_SITE_OTHER): Payer: Medicare Other | Admitting: Family Medicine

## 2017-12-25 ENCOUNTER — Encounter: Payer: Self-pay | Admitting: Family Medicine

## 2017-12-25 VITALS — BP 128/72 | HR 96 | Temp 97.8°F | Ht 72.0 in | Wt 144.2 lb

## 2017-12-25 DIAGNOSIS — J438 Other emphysema: Secondary | ICD-10-CM

## 2017-12-25 DIAGNOSIS — Z87891 Personal history of nicotine dependence: Secondary | ICD-10-CM | POA: Diagnosis not present

## 2017-12-25 DIAGNOSIS — I10 Essential (primary) hypertension: Secondary | ICD-10-CM | POA: Diagnosis not present

## 2017-12-25 DIAGNOSIS — S22000A Wedge compression fracture of unspecified thoracic vertebra, initial encounter for closed fracture: Secondary | ICD-10-CM

## 2017-12-25 DIAGNOSIS — J9611 Chronic respiratory failure with hypoxia: Secondary | ICD-10-CM

## 2017-12-25 DIAGNOSIS — Z0000 Encounter for general adult medical examination without abnormal findings: Secondary | ICD-10-CM | POA: Diagnosis not present

## 2017-12-25 DIAGNOSIS — E785 Hyperlipidemia, unspecified: Secondary | ICD-10-CM

## 2017-12-25 DIAGNOSIS — R6889 Other general symptoms and signs: Secondary | ICD-10-CM | POA: Diagnosis not present

## 2017-12-25 LAB — COMPREHENSIVE METABOLIC PANEL
ALBUMIN: 4.3 g/dL (ref 3.5–5.2)
ALT: 25 U/L (ref 0–53)
AST: 23 U/L (ref 0–37)
Alkaline Phosphatase: 89 U/L (ref 39–117)
BILIRUBIN TOTAL: 0.6 mg/dL (ref 0.2–1.2)
BUN: 9 mg/dL (ref 6–23)
CALCIUM: 9.6 mg/dL (ref 8.4–10.5)
CHLORIDE: 96 meq/L (ref 96–112)
CO2: 31 meq/L (ref 19–32)
CREATININE: 0.73 mg/dL (ref 0.40–1.50)
GFR: 112.81 mL/min (ref >60.00)
Glucose, Bld: 95 mg/dL (ref 70–99)
Potassium: 4.7 mEq/L (ref 3.5–5.1)
Sodium: 137 mEq/L (ref 135–145)
Total Protein: 7 g/dL (ref 6.0–8.3)

## 2017-12-25 LAB — LIPID PANEL
CHOL/HDL RATIO: 2
CHOLESTEROL: 262 mg/dL — AB (ref 0–200)
HDL: 143.3 mg/dL (ref >39.00)
LDL CALC: 107 mg/dL — AB (ref 0–99)
NonHDL: 118.21
TRIGLYCERIDES: 54 mg/dL (ref 0.0–149.0)
VLDL: 10.8 mg/dL (ref 0.0–40.0)

## 2017-12-25 LAB — POC URINALSYSI DIPSTICK (AUTOMATED)
BILIRUBIN UA: NEGATIVE
Blood, UA: NEGATIVE
GLUCOSE UA: NEGATIVE
KETONES UA: NEGATIVE
LEUKOCYTES UA: NEGATIVE
Nitrite, UA: NEGATIVE
PROTEIN UA: NEGATIVE
SPEC GRAV UA: 1.015 (ref 1.010–1.025)
Urobilinogen, UA: 0.2 E.U./dL
pH, UA: 6 (ref 5.0–8.0)

## 2017-12-25 LAB — CBC WITH DIFFERENTIAL/PLATELET
BASOS ABS: 0 10*3/uL (ref 0.0–0.1)
BASOS PCT: 0.5 % (ref 0.0–3.0)
Eosinophils Absolute: 0 10*3/uL (ref 0.0–0.7)
Eosinophils Relative: 0.4 % (ref 0.0–5.0)
HCT: 40.7 % (ref 39.0–52.0)
Hemoglobin: 14 g/dL (ref 13.0–17.0)
LYMPHS ABS: 0.6 10*3/uL — AB (ref 0.7–4.0)
LYMPHS PCT: 6 % — AB (ref 12.0–46.0)
MCHC: 34.4 g/dL (ref 30.0–36.0)
MCV: 95.2 fl (ref 78.0–100.0)
MONOS PCT: 5 % (ref 3.0–12.0)
Monocytes Absolute: 0.5 10*3/uL (ref 0.1–1.0)
NEUTROS ABS: 8.9 10*3/uL — AB (ref 1.4–7.7)
NEUTROS PCT: 88.1 % — AB (ref 43.0–77.0)
PLATELETS: 297 10*3/uL (ref 150.0–400.0)
RBC: 4.27 Mil/uL (ref 4.22–5.81)
RDW: 14.1 % (ref 11.5–15.5)
WBC: 10.1 10*3/uL (ref 4.0–10.5)

## 2017-12-25 LAB — VITAMIN D 25 HYDROXY (VIT D DEFICIENCY, FRACTURES): VITD: 27.16 ng/mL — ABNORMAL LOW (ref 30.00–100.00)

## 2017-12-25 LAB — TSH: TSH: 0.97 u[IU]/mL (ref 0.35–4.50)

## 2017-12-25 MED ORDER — MUPIROCIN 2 % EX OINT: 1.0000 "application " | TOPICAL_OINTMENT | Freq: Two times a day (BID) | CUTANEOUS | 0 refills | Status: DC

## 2017-12-25 NOTE — Assessment & Plan Note (Signed)
HLD- noted in past. He has not wanted to start stain. Does have elevated CV risk with 10 year risk over 16% last check. Will update lipids- likely to recommend statin again Lab Results  Component Value Date   CHOL 241 (H) 12/06/2016   HDL 125.70 06/05/2016   LDLCALC 121 (H) 06/05/2016   LDLDIRECT 75.0 12/06/2016   TRIG 64.0 06/05/2016   CHOLHDL 2 06/05/2016  We discussed using atorvastatin 40mg  once a week if LDL above 70- he is agreeable to try this

## 2017-12-25 NOTE — Progress Notes (Signed)
Please let us know by responding to this when you get this mychart message and let us know if you understand the results/directions  Your CBC was largely normal (blood counts, infection fighting cells, platelets). Your CMET was normal (kidney, liver, and electrolytes, blood sugar).  Your cholesterol remains elevated for bad cholesterol but you have great good cholesterol. I would consider taking atorvastatin 40mg  once a week to further lower your heart risk. Let us know if you would like to take this and our team can send it in.  Your vitamin D is slightly low- I would recommend 2000 units daily of vitamin over the counter Your thyroid was normal

## 2017-12-25 NOTE — Progress Notes (Signed)
Phone: [phone]  Subjective:  Patient presents today for their annual physical. Chief complaint-noted.   See problem oriented charting- ROS- full  review of systems was completed and negative except for: cough due to copd, edema, back pain  The following were reviewed and entered/updated in epic: Past Medical History:  Diagnosis Date  . Chest x-ray abnormality   . Chronic rhinitis    -Sinus Ct 08/01/2009 >> Bilateral maxillary sinusitis with some mucosal thickeningin the sphenoid and frontal sinuses as well with air fluid levels present -chronic rhinitis flyer Aug 04, 2009  . COPD (chronic obstructive pulmonary disease) [facility])    PFT's rec Jul 17, 2009  . Onychomycosis    Dr. Judi Cong   Patient Active Problem List   Diagnosis Date Noted  . Thoracic compression fracture ([place]) 11/02/2017    Priority: High  . Former smoker 08/23/2008    Priority: High  . COPD (chronic obstructive pulmonary disease) with emphysema ([place]) 08/23/2008    Priority: High  . BPH associated with nocturia 06/25/2017    Priority: Medium  . Hyperglycemia 06/12/2016    Priority: Medium  . Hyperlipidemia 07/22/2014    Priority: Medium  . Onychomycosis 07/22/2014    Priority: Low  . Left ankle swelling 07/22/2014    Priority: Low  . History of skin cancer 05/17/2014    Priority: Low  . Chronic rhinitis 10/28/2012    Priority: Low  . Pulmonary nodule 09/23/2011    Priority: Low  . History of colonic polyps 08/23/2008    Priority: Low  . Osteoporosis 11/19/2017  . Syncope 11/02/2017  . Essential hypertension 11/02/2017  . Venous stasis dermatitis of both lower extremities 11/02/2017  . Forearm laceration, right, initial encounter 11/02/2017  . Chronic respiratory failure with hypoxia ([place]) 09/23/2017  . Pleuritic pain 07/17/2017  . Leukocytosis 12/19/2016   Past Surgical History:  Procedure Laterality Date  . APPENDECTOMY    . TONSILLECTOMY      Family History  Problem Relation Age of Onset    . Heart disease Mother        CABG in her 56s, nonsmoker  . Cancer Mother   . Stroke Father   . Heart disease Father        Died of MI at age 56, smoker  . Hepatitis Sister   . Coronary artery disease Other        male 1st degree relative <60    Medications- reviewed and updated Current Outpatient Medications  Medication Sig Dispense Refill  . albuterol (VENTOLIN HFA) 108 (90 Base) MCG/ACT inhaler USE 2 PUFFS EVERY 6 HOURS  AS NEEDED FOR WHEEZING 3 Inhaler 3  . azithromycin (ZITHROMAX) 250 MG tablet TAKE 1 TABLET BY MOUTH ON  MONDAY, WEDNESDAY, AND  FRIDAY 39 tablet 1  . calcitonin, salmon, (MIACALCIN/FORTICAL) 200 UNIT/ACT nasal spray PLACE 1 SPRAY INTO ALTERNATE NOSTRILS DAILY. 3.17 mL 2  . Calcium Carbonate-Vit D-Min (CALCIUM 1200 PO) Take 1,200 mg by mouth daily.    . Cholecalciferol (VITAMIN D3) 1000 units CAPS Take 1,000 Units by mouth daily.    . fluticasone (FLONASE) 50 MCG/ACT nasal spray USE 2 SPRAYS IN EACH  NOSTRIL DAILY 48 g 1  . furosemide (LASIX) 20 MG tablet Take 1 tablet (20 mg total) by mouth daily as needed for edema. 30 tablet 0  . Gauze Pads & Dressings (KERLIX GAUZE ROLL LARGE) MISC secure all dressed wounds with Kerlix roll gauze/paper tape 100 each 5  . HYDROcodone-acetaminophen (NORCO/VICODIN) 5-325 MG tablet Take 1-2 tablets  by mouth every 4 (four) hours as needed for moderate pain. 20 tablet 0  . Misc. Devices (PULSE OXIMETER FOR FINGER) MISC 1 Device by Does not apply route as needed. 1 each 0  . montelukast (SINGULAIR) 10 MG tablet TAKE 1 TABLET BY MOUTH  EVERY NIGHT AT BEDTIME 90 tablet 3  . OXYGEN Inhale 2 L into the lungs continuous. 2L O2 at rest and 3L O2 with exertion.    . predniSONE (DELTASONE) 10 MG tablet TAKE 1 TABLET BY MOUTH  DAILY WITH BREAKFAST 90 tablet 3  . SPIRIVA RESPIMAT 2.5 MCG/ACT AERS USE 2 SPRAYS (INHALATIONS)  ONCE DAILY 12 g 1  . SYMBICORT 160-4.5 MCG/ACT inhaler USE 2 PUFFS BY MOUTH TWO  TIMES DAILY 3 Inhaler 1  . terbinafine  (LAMISIL) 250 MG tablet terbinafine HCl 250 mg tablet    . alendronate (FOSAMAX) 70 MG tablet Take 1 tablet (70 mg total) by mouth once a week. Take with a full glass of water on an empty stomach. (Patient not taking: Reported on 12/05/2017) 5 tablet 11  . mupirocin ointment (BACTROBAN) 2 % Apply 1 application topically 2 (two) times daily. For 7-10 days 22 g 0   No current facility-administered medications for this visit.     Allergies-reviewed and updated Allergies  Allergen Reactions  . Tape Other (See Comments)    SKIN IS VERY THIN; CAN ONLY USE COBAN WRAPS DUE TO CONDITION OF SKIN!!    Social History   Social History Narrative   Married (wife Katy Apo patient of Dr. Yong Channel) with children (2-son and daughter). 1 grandson.       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       Hobbies: fishing, projects at home    Objective: BP 128/72 (BP Location: Left Arm, Patient Position: Sitting, Cuff Size: Large)   Pulse 96   Temp 97.8 F (36.6 C) (Oral)   Ht 6' (1.829 m)   Wt 144 lb 3.2 oz (65.4 kg)   SpO2 97% Comment: 2.5 lpm via [place]  BMI 19.56 kg/m  Gen: NAD, resting comfortably HEENT: Mucous membranes are moist. Oropharynx normal Neck: no thyromegaly CV: RRR no murmurs rubs or gallops Lungs: CTAB no crackles, wheeze, rhonchi Abdomen: soft/nontender/nondistended/normal bowel sounds.  Ext: no edema Skin: warm, dry Neuro: grossly normal, moves all extremities, PERRLA GU: 1-1.5 cm raised tender area with central white area at top of right scrotum  Assessment/Plan:  72 y.o. male presenting for annual physical.  Health Maintenance counseling: 1. Anticipatory guidance: Patient counseled regarding regular dental exams -q6 months, eye exams -yearly, wearing seatbelts.  2. Risk factor reduction:  Advised patient of need for regular exercise and diet rich and fruits and vegetables to reduce risk of heart attack and stroke. Exercise- limited by copd as well  as back pain from fracture- plans when back is better to do pulmonary rehab.  Diet-weight reasonable, reasonable diet.  Wt Readings from Last 3 Encounters:  12/25/17 144 lb 3.2 oz (65.4 kg)  12/05/17 142 lb 3.2 oz (64.5 kg)  11/18/17 142 lb 12.8 oz (64.8 kg)  3. Immunizations/screenings/ancillary studies- - We discussed shingrix availability issues  as well as coverage issues (part D medicare)- I recommended that patient get vaccine at the pharmacy  Immunization History  Administered Date(s) Administered  . Influenza Split 04/24/2012  . Influenza Whole 05/08/2009, 04/08/2011  . Influenza, High Dose Seasonal PF 05/01/2017  . Influenza,inj,Quad PF,6+ Mos 04/27/2013, 04/15/2014, 04/24/2015  . Influenza-Unspecified 05/13/2016  .  Pneumococcal Conjugate-13 02/25/2014  . Pneumococcal Polysaccharide-23 08/15/2011, 12/19/2016  . Td 02/21/2010  . Tdap 11/05/2017  . Zoster 01/29/2012  4. Prostate cancer screening-  with comorbidities- yield of prostate cancer screening likely low . Does have BPH with nocturia and weak stream. He opts out of PSA this year Lab Results  Component Value Date   PSA 1.02 06/25/2017   PSA 1.17 06/05/2016   PSA 0.71 02/25/2014   5. Colon cancer screening - 04/02/10 with 5-10 year repeat possible- but Dr. Earlean Shawl also wants to consider stopping screening due to comorbidities 6. Skin cancer screening- sees Dr. Ronnald Ramp yearly (has had a break this year with recent injuries)- he is on chronic terbinafine/lamisil. advised regular sunscreen use. Denies worrisome, changing, or new skin lesions.   Status of chronic or acute concerns   Quit smoking in march- congratulated again.   COPD- remains on oxygen (chronic respiratory failure with hyposia stable). He follows with [name]- on symbicort, spiriva, chronic prednisone. Also on singulair and flonase for allergies. Also on azithromycin 3x a week. Has 8 week follow up from late may  HTN- controlled on lasix 40mg  daily prn- no  BP issues when euvolemic. Using compression stockings on legs. Has only used once one 3 day stretch since last visit.   Feels cold all the time- wants to check TSH and vitamin D. States didn't feel this way years ago.   Thoracic compression fracture/osteoporosis- he is on calcitonin. Working with [place]- no kyphoplasty recommended at this time- wants to see him back in 3-4 more weeks. He wants him to stay on calcitonin. Perhaps taking 2 hydrocodone per week when pain is at its worst- orthopedics now prescribing the hydrocodone. Also does some sparing tylenol.   Right arm wound has healed up.   On upper portion of right scrotum he has a small abscess now- this prior was larger in last week or two but burst in last few days and improving. Still has some irritation there and wonders about topical antibiotic to help.   Hyperlipidemia HLD- noted in past. He has not wanted to start stain. Does have elevated CV risk with 10 year risk over 16% last check. Will update lipids- likely to recommend statin again Lab Results  Component Value Date   CHOL 241 (H) 12/06/2016   HDL 125.70 06/05/2016   LDLCALC 121 (H) 06/05/2016   LDLDIRECT 75.0 12/06/2016   TRIG 64.0 06/05/2016   CHOLHDL 2 06/05/2016  We discussed using atorvastatin 40mg  once a week if LDL above 70- he is agreeable to try this  Future Appointments  Date Time Provider [place]  01/29/2018  9:00 AM Brand Males, MD [facility] None   Return in about 6 months (around 06/26/2018) for follow up- or sooner if needed.  Lab/Order associations: Preventative health care  Former smoker - Plan: POCT Urinalysis Dipstick (Automated), POCT Urinalysis Dipstick (Automated)  Hyperlipidemia, unspecified hyperlipidemia type - Plan: Comprehensive metabolic panel, Lipid panel, CBC with Differential/Platelet, TSH  Closed compression fracture of thoracic vertebra, initial encounter ([place]) - Plan: VITAMIN D 25 Hydroxy (Vit-D  Deficiency, Fractures)  Cold intolerance - Plan: TSH, VITAMIN D 25 Hydroxy (Vit-D Deficiency, Fractures)  Meds ordered this encounter  Medications  . mupirocin ointment (BACTROBAN) 2 %    Sig: Apply 1 application topically 2 (two) times daily. For 7-10 days    Dispense:  22 g    Refill:  0    Return precautions advised.  Garret Reddish, MD

## 2017-12-25 NOTE — Patient Instructions (Addendum)
Please check with your pharmacy to see if they have the shingrix vaccine. If they do- please get this immunization and update [place] by phone call or mychart with dates you receive the vaccine  Can use topical antibiotic on small abscess in groin. If not continuing to heal see [place] back - we would need to consider having you see urology and using oral antibiotics in meantime   Great job staying off smoking! I remain impressed with your wound healing! Great job at home on that

## 2017-12-30 ENCOUNTER — Ambulatory Visit: Payer: Medicare Other | Admitting: Emergency Medicine

## 2018-01-05 ENCOUNTER — Encounter (HOSPITAL_BASED_OUTPATIENT_CLINIC_OR_DEPARTMENT_OTHER): Payer: Medicare Other | Attending: Internal Medicine

## 2018-01-05 DIAGNOSIS — Z09 Encounter for follow-up examination after completed treatment for conditions other than malignant neoplasm: Secondary | ICD-10-CM | POA: Insufficient documentation

## 2018-01-05 DIAGNOSIS — J449 Chronic obstructive pulmonary disease, unspecified: Secondary | ICD-10-CM | POA: Insufficient documentation

## 2018-01-05 DIAGNOSIS — I872 Venous insufficiency (chronic) (peripheral): Secondary | ICD-10-CM | POA: Insufficient documentation

## 2018-01-05 DIAGNOSIS — I890 Lymphedema, not elsewhere classified: Secondary | ICD-10-CM | POA: Insufficient documentation

## 2018-01-05 DIAGNOSIS — Z872 Personal history of diseases of the skin and subcutaneous tissue: Secondary | ICD-10-CM | POA: Insufficient documentation

## 2018-01-12 DIAGNOSIS — Z872 Personal history of diseases of the skin and subcutaneous tissue: Secondary | ICD-10-CM | POA: Diagnosis not present

## 2018-01-12 DIAGNOSIS — I872 Venous insufficiency (chronic) (peripheral): Secondary | ICD-10-CM | POA: Diagnosis not present

## 2018-01-12 DIAGNOSIS — J449 Chronic obstructive pulmonary disease, unspecified: Secondary | ICD-10-CM | POA: Diagnosis not present

## 2018-01-12 DIAGNOSIS — I890 Lymphedema, not elsewhere classified: Secondary | ICD-10-CM | POA: Diagnosis not present

## 2018-01-12 DIAGNOSIS — Z09 Encounter for follow-up examination after completed treatment for conditions other than malignant neoplasm: Secondary | ICD-10-CM | POA: Diagnosis not present

## 2018-01-27 ENCOUNTER — Other Ambulatory Visit: Payer: Self-pay | Admitting: Emergency Medicine

## 2018-01-29 ENCOUNTER — Ambulatory Visit (INDEPENDENT_AMBULATORY_CARE_PROVIDER_SITE_OTHER): Payer: Medicare Other | Admitting: Internal Medicine

## 2018-01-29 ENCOUNTER — Encounter: Payer: Self-pay | Admitting: Internal Medicine

## 2018-01-29 VITALS — BP 122/78 | HR 94 | Ht 72.0 in | Wt 149.8 lb

## 2018-01-29 DIAGNOSIS — J9611 Chronic respiratory failure with hypoxia: Secondary | ICD-10-CM | POA: Diagnosis not present

## 2018-01-29 DIAGNOSIS — J449 Chronic obstructive pulmonary disease, unspecified: Secondary | ICD-10-CM | POA: Diagnosis not present

## 2018-01-29 MED ORDER — FLUTICASONE-UMECLIDIN-VILANT 100-62.5-25 MCG/INH IN AEPB: 1.0000 | INHALATION_SPRAY | Freq: Every day | RESPIRATORY_TRACT | 0 refills | Status: DC

## 2018-01-29 NOTE — Patient Instructions (Addendum)
ICD-10-CM   1. Chronic respiratory failure with hypoxia (HCC) J96.11   2. Stage 3 severe COPD by GOLD classification ([place]) J44.9     Stable without flare up Some better symptomatically  PLAN  - use o2 as before -give trial of trelegy daily for 2-4 weeks  - during this time hold off symbicort and spiriva scheduled   - continue azithromycin 3 times a week  - continue chronic daily prednisone  - use albuterol as needed - agree no need for singulair - flu shot in fall  Followup - 12 weeks to see Dr [name]; CAT score at followup  - can discuss rehab at followup

## 2018-01-29 NOTE — Progress Notes (Signed)
Subjective:     Patient ID: Francisco [name], male   DOB: [birth year], 72 y.o.   MRN: [id number]  P  HPI     ROV 12/30/16 -- patient has a history of tobacco use, COPD currently maintained on chronic prednisone, Symbicort, Spiriva. Most recent blurry function testing was March 2011 with an FEV1 of 1.2 L (34% Pred), severe obstruction. He up titrates his prednisone on his own based on symptoms. No abx since last time. He has increased pred temporrily about 4 -5 x since last time. No increases for over a month. He is smoking a pipe, not cigarettes. He is using HCTZ more frequently these days. He is on flonase and singulair. He does note that he has some slow progression of his DOE. He coughs a few times a day, clears clear sputum.   rov 07/17/17 --this is a follow-up visit for patient with active tobacco use, severe COPD and chronic prednisone use.  He often titrates his prednisone on his own depending on how he is feeling. He complains of a new pain in his back below his left shoulder blade, has been present for the last 10 days. sometimes pleuritic, can be stabbing, worse w cough. His pred use: on 10mg  for the last 19 days. No hx VTE. He has LE edema, venous stasis changes, increased since his diuretics decreased.   ROV 09/23/17 --72 year old man with a history of tobacco use (still smokes pipe), severe COPD and severe obstruction on spirometry.  At his last visit he had some pleuritic left back pain and I performed a CT PA that I have reviewed from 07/17/17.  There was no pulmonary embolism but extensive emphysema present, some bilateral apical scar more so on the right, stable.  He uses prednisone 10 mg daily and uptitrate depending on his day-to-day symptoms.  He is otherwise managed on Symbicort and Spiriva.  Desaturated on arrival today after ambulating to the exam room. He reports more dyspnea, has increased pred intermittently.    OV 12/05/2017  Chief Complaint  Patient presents with  .  Follow-up    Switching from RB to MR.   Pt is on 3L with exertion and 2L at rest. Pt states his breathing had become worse but since he has been on O2 since 3/21, it has really helped with his breathing.  Pt was in hospital 4/28-5/3. Other than SOB, pt does have c/o cough, rattling in chest. Denies any CP/chest tightness.   History from patient, his wife and review of the old chart  Francisco [name] a transfer of care from [name] to myself [name].  His son is my patient and therefore he is done this transfer of care.  According to the patient he is to be seen by [name] for COPD.  Patient is FEV1 34% based on March 2011 PFTs according to chart review.  At baseline he is maintained on Spiriva, Symbicort, Singulair, chronic daily prednisone 10 mg/day for at least 3 years, and 3 times a week of azithromycin for 2 years.  Earlier this year in March 2019 he went on daytime oxygen and following a recent hospitalization April-May 2019.  For syncope that was believed due to nocturnal hypoxemia he was started on night oxygen.  Since starting oxygen his edema has improved and his overall quality of life is improved and he stopped having any orthostasis or presyncopal episodes.  He is grateful for the fact that he is on oxygen.  His current COPD  CAT score and severity of symptoms is rated below and is 26.  Show significant amount of symptom burden.  His main goal is to improve his quality of life.  He had his wife have many questions centering around quality of life, medication therapy.  Of note he has had some thoracic vertebral fractures that are new.  This happened after the fall in late April 2019.  Discovered at admission last month.  He feels is due to prednisone.  He is wondering about portable oxygen.   OV 01/29/2018  Chief Complaint  Patient presents with  . Follow-up    Pt states he has been doing okay since last visit. States breathing is about the same, has an occ cough but states the  O2 has helped out a lot with breathing and cough. Denies any complaints of CP.   Francisco [name] , 72 y.o. , with dob [birth year] and male ,Not Hispanic or Latino from [place] [id number] - presents to lung clinic for advanced COPD follow-up. Since his last visit his symptoms course of improvement. Score is 26 and severe symptoms of document below. He is on Spiriva, Symbicort, continues oxygen, daily prednisone and azithromycin 3 times a week. He has stopped his singulair without any problems. Overall he says he is better. He is willing to give TRELEGY inhaler trial. Last visit to check blood gas and he does not have hypercapnia so he does not qualify for BiPAP     CAT COPD Symptom & Quality of Life Score ([place]) 0 is no burden. 5 is highest burden 12/05/2017  01/29/2018   Never Cough -> Cough all the time 3 1  No phlegm in chest -> Chest is full of phlegm 2 2  No chest tightness -> Chest feels very tight 1 0  No dyspnea for 1 flight stairs/hill -> Very dyspneic for 1 flight of stairs 5 4  No limitations for ADL at home -> Very limited with ADL at home 5 5  Confident leaving home -> Not at all confident leaving home 3 3  Sleep soundly -> Do not sleep soundly because of lung condition 3 1  Lots of Energy -> No energy at all 4 4  TOTAL Score (max 40)  26 20        has a past medical history of Chest x-ray abnormality, Chronic rhinitis, COPD (chronic obstructive pulmonary disease) ([place]), and Onychomycosis.     reports that he has quit smoking. His smoking use included pipe and cigarettes. He started smoking about 3 months ago. He has a 20.00 pack-year smoking history. He has never used smokeless tobacco.  Past Surgical History:  Procedure Laterality Date  . APPENDECTOMY    . TONSILLECTOMY      Allergies  Allergen Reactions  . Tape Other (See Comments)    SKIN IS VERY THIN; CAN ONLY USE COBAN WRAPS DUE TO CONDITION OF SKIN!!    Immunization History   Administered Date(s) Administered  . Influenza Split 04/24/2012  . Influenza Whole 05/08/2009, 04/08/2011  . Influenza, High Dose Seasonal PF 05/01/2017  . Influenza,inj,Quad PF,6+ Mos 04/27/2013, 04/15/2014, 04/24/2015  . Influenza-Unspecified 05/13/2016  . Pneumococcal Conjugate-13 02/25/2014  . Pneumococcal Polysaccharide-23 08/15/2011, 12/19/2016  . Pneumococcal-Unspecified 03/08/2017  . Td 02/21/2010  . Tdap 11/05/2017  . Zoster 01/29/2012    Family History  Problem Relation Age of Onset  . Heart disease Mother        CABG in her 36s, nonsmoker  .  Cancer Mother   . Stroke Father   . Heart disease Father        Died of MI at age 58, smoker  . Hepatitis Sister   . Coronary artery disease Other        male 1st degree relative <60     Current Outpatient Medications:  .  albuterol (VENTOLIN HFA) 108 (90 Base) MCG/ACT inhaler, USE 2 PUFFS EVERY 6 HOURS  AS NEEDED FOR WHEEZING, Disp: 3 Inhaler, Rfl: 3 .  alendronate (FOSAMAX) 70 MG tablet, Take 1 tablet (70 mg total) by mouth once a week. Take with a full glass of water on an empty stomach., Disp: 5 tablet, Rfl: 11 .  azithromycin (ZITHROMAX) 250 MG tablet, TAKE 1 TABLET BY MOUTH ON  MONDAY, WEDNESDAY, AND  FRIDAY, Disp: 39 tablet, Rfl: 1 .  Calcium Carbonate-Vit D-Min (CALCIUM 1200 PO), Take 1,200 mg by mouth daily., Disp: , Rfl:  .  Cholecalciferol (VITAMIN D3) 1000 units CAPS, Take 1,000 Units by mouth daily., Disp: , Rfl:  .  fluticasone (FLONASE) 50 MCG/ACT nasal spray, USE 2 SPRAYS IN EACH  NOSTRIL DAILY, Disp: 48 g, Rfl: 1 .  furosemide (LASIX) 20 MG tablet, Take 1 tablet (20 mg total) by mouth daily as needed for edema., Disp: 30 tablet, Rfl: 0 .  Gauze Pads & Dressings (KERLIX GAUZE ROLL LARGE) MISC, secure all dressed wounds with Kerlix roll gauze/paper tape, Disp: 100 each, Rfl: 5 .  HYDROcodone-acetaminophen (NORCO/VICODIN) 5-325 MG tablet, Take 1-2 tablets by mouth every 4 (four) hours as needed for moderate pain.,  Disp: 20 tablet, Rfl: 0 .  Misc. Devices (PULSE OXIMETER FOR FINGER) MISC, 1 Device by Does not apply route as needed., Disp: 1 each, Rfl: 0 .  OXYGEN, Inhale 2 L into the lungs continuous. 2L O2 at rest and 3L O2 with exertion., Disp: , Rfl:  .  predniSONE (DELTASONE) 10 MG tablet, TAKE 1 TABLET BY MOUTH  DAILY WITH BREAKFAST, Disp: 90 tablet, Rfl: 3 .  SPIRIVA RESPIMAT 2.5 MCG/ACT AERS, USE 2 SPRAYS (INHALATIONS)  ONCE DAILY, Disp: 12 g, Rfl: 1 .  SYMBICORT 160-4.5 MCG/ACT inhaler, USE 2 PUFFS BY MOUTH TWO  TIMES DAILY, Disp: 3 Inhaler, Rfl: 1 .  terbinafine (LAMISIL) 250 MG tablet, terbinafine HCl 250 mg tablet, Disp: , Rfl:     Review of Systems     Objective:   Physical Exam  Constitutional: He is oriented to person, place, and time. No distress.  thin  HENT:  Head: Normocephalic and atraumatic.  Right Ear: External ear normal.  Left Ear: External ear normal.  Mouth/Throat: Oropharynx is clear and moist. No oropharyngeal exudate.  o2 on  Eyes: Pupils are equal, round, and reactive to light. Conjunctivae and EOM are normal. Right eye exhibits no discharge. Left eye exhibits no discharge. No scleral icterus.  Neck: Normal range of motion. Neck supple. No JVD present. No tracheal deviation present. No thyromegaly present.  Cardiovascular: Normal rate, regular rhythm and intact distal pulses. Exam reveals no gallop and no friction rub.  No murmur heard. Pulmonary/Chest: Effort normal and breath sounds normal. No respiratory distress. He has no wheezes. He has no rales. He exhibits no tenderness.  barrell chest Baseline purse lip   Abdominal: Soft. Bowel sounds are normal. He exhibits no distension and no mass. There is no tenderness. There is no rebound and no guarding.  Musculoskeletal: Normal range of motion. He exhibits no edema or tenderness.  Lymphadenopathy:    He has  no cervical adenopathy.  Neurological: He is alert and oriented to person, place, and time. He has normal  reflexes. No cranial nerve deficit. Coordination normal.  Skin: Skin is warm and dry. No rash noted. He is not diaphoretic. No erythema. No pallor.  Scattered prednisone type bruising on forearm  Psychiatric: He has a normal mood and affect. His behavior is normal. Judgment and thought content normal.  Nursing note and vitals reviewed.  Today's Vitals   01/29/18 0855  BP: 122/78  Pulse: 94  SpO2: 100%  Weight: 149 lb 12.8 oz (67.9 kg)  Height: 6' (1.829 m)    Estimated body mass index is 20.32 kg/m as calculated from the following:   Height as of this encounter: 6' (1.829 m).   Weight as of this encounter: 149 lb 12.8 oz (67.9 kg).       Assessment:       ICD-10-CM   1. Chronic respiratory failure with hypoxia (HCC) J96.11   2. Stage 3 severe COPD by GOLD classification ([place]) J44.9        Plan:      Stable without flare up Some better symptomatically  PLAN  - use o2 as before -give trial of trelegy daily for 2-4 weeks  - during this time hold off symbicort and spiriva scheduled   - continue azithromycin 3 times a week  - continue chronic daily prednisone  - use albuterol as needed - agree no need for singulair - flu shot in fall  Followup - 12 weeks to see Dr [name]; CAT score at followup  - can discuss rehab at followup    Dr. Brand Males, M.D., [facility].C.P Pulmonary and Critical Care Medicine Staff Physician, [place] Director - Interstitial Lung Disease  Program  Pulmonary [place] at [place], [place], [address]  Pager: [phone], If no answer or between  15:00h - 7:00h: call [phone] Telephone: [phone]

## 2018-02-10 ENCOUNTER — Ambulatory Visit (INDEPENDENT_AMBULATORY_CARE_PROVIDER_SITE_OTHER): Payer: Medicare Other | Admitting: Family Medicine

## 2018-02-10 ENCOUNTER — Encounter: Payer: Self-pay | Admitting: Family Medicine

## 2018-02-10 VITALS — BP 132/84 | HR 96 | Temp 98.1°F | Ht 72.0 in | Wt 152.6 lb

## 2018-02-10 DIAGNOSIS — H6121 Impacted cerumen, right ear: Secondary | ICD-10-CM

## 2018-02-10 DIAGNOSIS — H8111 Benign paroxysmal vertigo, right ear: Secondary | ICD-10-CM | POA: Insufficient documentation

## 2018-02-10 MED ORDER — ALENDRONATE SODIUM 70 MG PO TABS: 70.0000 mg | ORAL_TABLET | ORAL | 3 refills | Status: DC

## 2018-02-10 NOTE — Assessment & Plan Note (Signed)
Cerumen impaction S:  Patient reports issues when going from lying to sitting with dizziness (initially reported). Patient is off all BP meds- maxzide 25 mg since may and had orthostatic issues prior. He is not having issues going from sitting to standing  2 episodes of dizziness last week when trying new machine/concentrator for oxygen.   Also started with intermittent episodes for 2-3 weeks of dizziness when going from laying to sitting. No issues going from sitting to standing. Just at this visit he got dizzy when laying down.   Has felt dizzy in past when ear was clogged. No dizziness with turning head. Doesn't turn in bed so doesn't know if that would cause it  Right ear congestion- but ear full of cerumen A/P: Patient does have cerumen impaction and this was corrected. He also had a positive dix hallpike and clearly experienced vertigo with this- we were able to clarify after this maneuver that he is actually having vertigo. Will send for vestibular rehab. Otherwise reassuring neuro exam- doubt stroke, menieres, TIA, etc.

## 2018-02-10 NOTE — Progress Notes (Signed)
Subjective:  Francisco [name] is a 72 y.o. year old very pleasant male patient who presents for/with See problem oriented charting ROS- no falls. Some feelings of ear congestion in right ear.  Noting vertigo. No hearing loss  Past Medical History-  Patient Active Problem List   Diagnosis Date Noted  . Thoracic compression fracture ([place]) 11/02/2017    Priority: High  . Former smoker 08/23/2008    Priority: High  . COPD (chronic obstructive pulmonary disease) with emphysema ([place]) 08/23/2008    Priority: High  . Essential hypertension 11/02/2017    Priority: Medium  . BPH associated with nocturia 06/25/2017    Priority: Medium  . Hyperglycemia 06/12/2016    Priority: Medium  . Hyperlipidemia 07/22/2014    Priority: Medium  . Onychomycosis 07/22/2014    Priority: Low  . Left ankle swelling 07/22/2014    Priority: Low  . History of skin cancer 05/17/2014    Priority: Low  . Chronic rhinitis 10/28/2012    Priority: Low  . Pulmonary nodule 09/23/2011    Priority: Low  . History of colonic polyps 08/23/2008    Priority: Low  . BPPV (benign paroxysmal positional vertigo), right 02/10/2018  . Osteoporosis 11/19/2017  . Syncope 11/02/2017  . Venous stasis dermatitis of both lower extremities 11/02/2017  . Forearm laceration, right, initial encounter 11/02/2017  . Chronic respiratory failure with hypoxia ([place]) 09/23/2017  . Pleuritic pain 07/17/2017  . Leukocytosis 12/19/2016    Medications- reviewed and updated Current Outpatient Medications  Medication Sig Dispense Refill  . albuterol (VENTOLIN HFA) 108 (90 Base) MCG/ACT inhaler USE 2 PUFFS EVERY 6 HOURS  AS NEEDED FOR WHEEZING 3 Inhaler 3  . alendronate (FOSAMAX) 70 MG tablet Take 1 tablet (70 mg total) by mouth once a week. Take with a full glass of water on an empty stomach. 90 tablet 3  . azithromycin (ZITHROMAX) 250 MG tablet TAKE 1 TABLET BY MOUTH ON  MONDAY, WEDNESDAY, AND  FRIDAY 39 tablet 1  . Calcium Carbonate-Vit  D-Min (CALCIUM 1200 PO) Take 1,200 mg by mouth daily.    . Cholecalciferol (VITAMIN D3) 1000 units CAPS Take 1,000 Units by mouth daily.    . fluticasone (FLONASE) 50 MCG/ACT nasal spray USE 2 SPRAYS IN EACH  NOSTRIL DAILY 48 g 1  . Fluticasone-Umeclidin-Vilant (TRELEGY ELLIPTA) 100-62.5-25 MCG/INH AEPB Inhale 1 puff into the lungs daily. 1 each 0  . furosemide (LASIX) 20 MG tablet Take 1 tablet (20 mg total) by mouth daily as needed for edema. 30 tablet 0  . HYDROcodone-acetaminophen (NORCO/VICODIN) 5-325 MG tablet Take 1-2 tablets by mouth every 4 (four) hours as needed for moderate pain. 20 tablet 0  . Misc. Devices (PULSE OXIMETER FOR FINGER) MISC 1 Device by Does not apply route as needed. 1 each 0  . OXYGEN Inhale 2 L into the lungs continuous. 2L O2 at rest and 3L O2 with exertion.    . predniSONE (DELTASONE) 10 MG tablet TAKE 1 TABLET BY MOUTH  DAILY WITH BREAKFAST 90 tablet 3  . SPIRIVA RESPIMAT 2.5 MCG/ACT AERS USE 2 SPRAYS (INHALATIONS)  ONCE DAILY 12 g 1  . SYMBICORT 160-4.5 MCG/ACT inhaler USE 2 PUFFS BY MOUTH TWO  TIMES DAILY 3 Inhaler 1  . terbinafine (LAMISIL) 250 MG tablet terbinafine HCl 250 mg tablet     No current facility-administered medications for this visit.     Objective: BP 132/84 (BP Location: Left Arm, Patient Position: Sitting, Cuff Size: Normal)   Pulse 96  Temp 98.1 F (36.7 C) (Oral)   Ht 6' (1.829 m)   Wt 152 lb 9.6 oz (69.2 kg)   SpO2 96% Comment: 2 liters of Oxygen  BMI 20.70 kg/m  Gen: NAD, resting comfortably CV: RRR no murmurs rubs or gallops Lungs: CTAB no crackles, wheeze, rhonchi Ext: no edema Skin: warm, dry, healing scar on right forearm Neuro: CN [name]-XII intact, sensation and reflexes normal throughout, 5/5 muscle strength in bilateral upper and lower extremities. Normal finger to nose. Normal rapid alternating movements. No pronator drift. Normal romberg. Normal gait.  Dix hallpike positive for nystagmus on right  Procedure  note: Cerumen noted in right ear.  Irrigation with water and peroxide performed. Full view of tympanic membrane after procedure.  Patient tolerated procedure well  Assessment/Plan:  BPPV (benign paroxysmal positional vertigo), right Cerumen impaction S:  Patient reports issues when going from lying to sitting with dizziness (initially reported). Patient is off all BP meds- maxzide 25 mg since may and had orthostatic issues prior. He is not having issues going from sitting to standing  2 episodes of dizziness last week when trying new machine/concentrator for oxygen.   Also started with intermittent episodes for 2-3 weeks of dizziness when going from laying to sitting. No issues going from sitting to standing. Just at this visit he got dizzy when laying down.   Has felt dizzy in past when ear was clogged. No dizziness with turning head. Doesn't turn in bed so doesn't know if that would cause it  Right ear congestion- but ear full of cerumen A/P: Patient does have cerumen impaction and this was corrected. He also had a positive dix hallpike and clearly experienced vertigo with this- we were able to clarify after this maneuver that he is actually having vertigo. Will send for vestibular rehab. Otherwise reassuring neuro exam- doubt stroke, menieres, TIA, etc.    Future Appointments  Date Time Provider [place]  02/12/2018  8:45 AM Mervyn Gay, PT [facility]  03/31/2018  8:00 AM [facility] HEALTH COACH [facility]  03/31/2018  8:45 AM [name], MD [facility]  07/06/2018  8:45 AM [name], MD [facility]   Lab/Order associations: Benign paroxysmal positional vertigo of right ear - Plan: Ambulatory referral to Physical Therapy  BPPV (benign paroxysmal positional vertigo), right  Meds ordered this encounter  Medications  . alendronate (FOSAMAX) 70 MG tablet    Sig: Take 1 tablet (70 mg total) by mouth once a week. Take with a full glass of water on an  empty stomach.    Dispense:  90 tablet    Refill:  3    Return precautions advised.  Garret Reddish, MD

## 2018-02-10 NOTE — Patient Instructions (Addendum)
Health Maintenance Due  Topic Date Due  . I would also like for you to sign up for an annual wellness visit with one of our nurses, Cassie or Manuela Schwartz, who both specialize in the annual wellness visit. This is a free benefit under medicare that may help [place] find additional ways to help you. Some highlights are reviewing medications, lifestyle, and doing a dementia screen.  I know you typically get these at home- I just always want to offer them here as well as I find our nurse team notes far more helpful than the home visits   . INFLUENZA VACCINE - October or November please 02/05/2018    Our team will check on if Lanny Hurst will call you (our referral coordinator) or if you need to schedule at desk. If Lanny Hurst will be calling you- We will call you within a week about your referral to vestibular rehab.  Mineral oil for ear full of wax Purchase mineral oil from laxative aisle Lay down on your side with ear that is bothering you facing up Use 3-4 drops with a dropper and place in ear for 30 seconds Place cotton swab outside of ear Turn to other side and allow this to drain Repeat 3-4 x a day Return to see [place] if not improving within a few days  Benign Positional Vertigo Vertigo is the feeling that you or your surroundings are moving when they are not. Benign positional vertigo is the most common form of vertigo. The cause of this condition is not serious (is benign). This condition is triggered by certain movements and positions (is positional). This condition can be dangerous if it occurs while you are doing something that could endanger you or others, such as driving. What are the causes? In many cases, the cause of this condition is not known. It may be caused by a disturbance in an area of the inner ear that helps your brain to sense movement and balance. This disturbance can be caused by a viral infection (labyrinthitis), head injury, or repetitive motion. What increases the risk? This condition is  more likely to develop in:  Women.  People who are 70 years of age or older.  What are the signs or symptoms? Symptoms of this condition usually happen when you move your head or your eyes in different directions. Symptoms may start suddenly, and they usually last for less than a minute. Symptoms may include:  Loss of balance and falling.  Feeling like you are spinning or moving.  Feeling like your surroundings are spinning or moving.  Nausea and vomiting.  Blurred vision.  Dizziness.  Involuntary eye movement (nystagmus).  Symptoms can be mild and cause only slight annoyance, or they can be severe and interfere with daily life. Episodes of benign positional vertigo may return (recur) over time, and they may be triggered by certain movements. Symptoms may improve over time. How is this diagnosed? This condition is usually diagnosed by medical history and a physical exam of the head, neck, and ears. You may be referred to a health care provider who specializes in ear, nose, and throat (ENT) problems (otolaryngologist) or a provider who specializes in disorders of the nervous system (neurologist). You may have additional testing, including:  MRI.  A CT scan.  Eye movement tests. Your health care provider may ask you to change positions quickly while he or she watches you for symptoms of benign positional vertigo, such as nystagmus. Eye movement may be tested with an electronystagmogram (ENG), caloric  stimulation, the Dix-Hallpike test, or the roll test.  An electroencephalogram (EEG). This records electrical activity in your brain.  Hearing tests.  How is this treated? Usually, your health care provider will treat this by moving your head in specific positions to adjust your inner ear back to normal. Surgery may be needed in severe cases, but this is rare. In some cases, benign positional vertigo may resolve on its own in 2-4 weeks. Follow these instructions at  home: Safety  Move slowly.Avoid sudden body or head movements.  Avoid driving.  Avoid operating heavy machinery.  Avoid doing any tasks that would be dangerous to you or others if a vertigo episode would occur.  If you have trouble walking or keeping your balance, try using a cane for stability. If you feel dizzy or unstable, sit down right away.  Return to your normal activities as told by your health care provider. Ask your health care provider what activities are safe for you. General instructions  Take over-the-counter and prescription medicines only as told by your health care provider.  Avoid certain positions or movements as told by your health care provider.  Drink enough fluid to keep your urine clear or pale yellow.  Keep all follow-up visits as told by your health care provider. This is important. Contact a health care provider if:  You have a fever.  Your condition gets worse or you develop new symptoms.  Your family or friends notice any behavioral changes.  Your nausea or vomiting gets worse.  You have numbness or a "pins and needles" sensation. Get help right away if:  You have difficulty speaking or moving.  You are always dizzy.  You faint.  You develop severe headaches.  You have weakness in your legs or arms.  You have changes in your hearing or vision.  You develop a stiff neck.  You develop sensitivity to light. This information is not intended to replace advice given to you by your health care provider. Make sure you discuss any questions you have with your health care provider. Document Released: 04/01/2006 Document Revised: 11/30/2015 Document Reviewed: 10/17/2014 Elsevier Interactive Patient Education  [employer].

## 2018-02-12 ENCOUNTER — Ambulatory Visit: Payer: Medicare Other | Admitting: Rehabilitative and Restorative Service Providers"

## 2018-02-22 ENCOUNTER — Encounter: Payer: Self-pay | Admitting: Family Medicine

## 2018-02-23 ENCOUNTER — Other Ambulatory Visit: Payer: Self-pay

## 2018-02-23 MED ORDER — ALENDRONATE SODIUM 70 MG PO TABS: 70.0000 mg | ORAL_TABLET | ORAL | 3 refills | Status: DC

## 2018-02-27 ENCOUNTER — Other Ambulatory Visit: Payer: Self-pay | Admitting: Internal Medicine

## 2018-03-03 ENCOUNTER — Other Ambulatory Visit: Payer: Self-pay | Admitting: Emergency Medicine

## 2018-03-05 ENCOUNTER — Ambulatory Visit: Payer: Medicare Other | Attending: Family Medicine | Admitting: Rehabilitative and Restorative Service Providers"

## 2018-03-05 ENCOUNTER — Encounter: Payer: Self-pay | Admitting: Rehabilitative and Restorative Service Providers"

## 2018-03-05 DIAGNOSIS — H8111 Benign paroxysmal vertigo, right ear: Secondary | ICD-10-CM

## 2018-03-05 DIAGNOSIS — M6281 Muscle weakness (generalized): Secondary | ICD-10-CM

## 2018-03-05 DIAGNOSIS — R2681 Unsteadiness on feet: Secondary | ICD-10-CM | POA: Diagnosis present

## 2018-03-05 DIAGNOSIS — R2689 Other abnormalities of gait and mobility: Secondary | ICD-10-CM | POA: Insufficient documentation

## 2018-03-05 NOTE — Therapy (Signed)
[facility] [address] [place], [place], [address] Phone: [phone]   Fax:  [phone]  Physical Therapy Evaluation  Patient Details  Name: Francisco [name] MRN: [id number] Date of Birth: April 15, 1946 Referring Provider: Garret Reddish, DO   Encounter Date: 03/05/2018  PT End of Session - 03/05/18 1518    Visit Number  1    Number of Visits  9    Date for PT Re-Evaluation  04/19/18   due to schedule availability   Authorization Type  [employer] medicare    PT Start Time  [phone]    PT Stop Time  0930    PT Time Calculation (min)  40 min    Activity Tolerance  Patient tolerated treatment well    Behavior During Therapy  [facility] for tasks assessed/performed       Past Medical History:  Diagnosis Date  . Chest x-ray abnormality   . Chronic rhinitis    -Sinus Ct 08/01/2009 >> Bilateral maxillary sinusitis with some mucosal thickeningin the sphenoid and frontal sinuses as well with air fluid levels present -chronic rhinitis flyer Aug 04, 2009  . COPD (chronic obstructive pulmonary disease) [facility])    PFT's rec Jul 17, 2009  . Onychomycosis    Dr. Judi Cong    Past Surgical History:  Procedure Laterality Date  . APPENDECTOMY    . TONSILLECTOMY      There were no vitals filed for this visit.   Subjective Assessment - 03/05/18 0850    Subjective  Patient notes in the morning when he sits up "I'm real dizzy."  He notes "blurry vision, lightheadedness" that mainly occurs in the morning, however has also had 3 other times when sitting still and not getting up (he notes he was sitting but  may have turned his head).  The patient reports some dizziness in the past when walking and looking up to the ceiling (2-3 years ago).  He reports a h/o orthostatic hypotension and had a syncopal episode on 11/02/2017 (with hospital admission).      Pertinent History  Recent injuries ( T6 and T8 fxs) with syncopal episode in 10/2017.  h/o copd,  emphysema, O2 dependent 3.5 L 24 hours/day.    Patient Stated Goals  Get rid of dizziness.     Currently in Pain?  Yes    Pain Score  10-Worst pain ever    Pain Location  Back    Pain Orientation  Mid    Pain Descriptors / Indicators  Tender;Aching    Pain Onset  More than a month ago    Pain Frequency  Intermittent    Aggravating Factors   goes up to 10/10; unsure     Pain Relieving Factors  pain meds         [facility] PT Assessment - 03/05/18 0859      Assessment   Medical Diagnosis  BPPV of the right ear    Referring Provider  Garret Reddish, DO    Onset Date/Surgical Date  11/02/17    Prior Therapy  Home health PT; stopped due to inability to progress further b/c of back pain      Precautions   Precautions  Fall    Precaution Comments  orthostatic hypotension      Restrictions   Weight Bearing Restrictions  No      Balance Screen   Has the patient fallen in the past 6 months  Yes    How many times?  multiple  due to orthostasis    Has the patient had a decrease in activity level because of a fear of falling?   Yes    Is the patient reluctant to leave their home because of a fear of falling?   Yes      [place] residence    Living Arrangements  Spouse/significant other    Type of [place] to enter    Entrance Stairs-Rails  Can reach both    [place]  One level    Additional Comments  home oxygen      Prior Function   Level of Independence  Independent    Leisure  limitations due to breathing difficulties           Vestibular Assessment - 03/05/18 0905      Vestibular Assessment   General Observation  No dizziness at rest.      Symptom Behavior   Type of Dizziness  Lightheadedness    Frequency of Dizziness  daily    Duration of Dizziness  seconds    Aggravating Factors  Mornings;Supine to sit   lying down at times   Relieving Factors  Head stationary      Occulomotor Exam   Occulomotor  Alignment  Normal    Spontaneous  Absent    Gaze-induced  Absent    Smooth Pursuits  Intact    Saccades  Intact      Vestibulo-Occular Reflex   VOR 1 Head Only (x 1 viewing)  Self regulated speed= able to complete without symptoms.      Positional Testing   Sidelying Test  Sidelying Right;Sidelying Left    Horizontal Canal Testing  Horizontal Canal Right;Horizontal Canal Left      Sidelying Right   Sidelying Right Duration  Severe nystagmus lasting for 20 seconds.  Patient grabs for mat and holds on due to sensation of falling.    Sidelying Right Symptoms  Upbeat, right rotatory nystagmus      Sidelying Left   Sidelying Left Duration  90 seconds (? cupulolithiasis versus central positional versus reversal of otoconia in the right posterior canal during testing of the left side?)    Sidelying Left Symptoms  Downbeat Nystagmus   cannot determine rotary component     Horizontal Canal Right   Horizontal Canal Right Duration  none    Horizontal Canal Right Symptoms  Normal      Horizontal Canal Left   Horizontal Canal Left Duration  none    Horizontal Canal Left Symptoms  Normal          Objective measurements completed on examination: See above findings.      [facility] Adult PT Treatment/Exercise - 03/05/18 1517      Self-Care   Self-Care  Other Self-Care Comments    Other Self-Care Comments   PT and patient + his wife discussed treating BPPV per referral.  We also discussed his fall risk per observation of unsteady gait, slowed pace and general deconditioning.  The patient notes he is declining in his mobility and is willing to work on his balance after vertigo treated.       Vestibular Treatment/Exercise - 03/05/18 1516      Vestibular Treatment/Exercise   Vestibular Treatment Provided  Canalith Repositioning    Canalith Repositioning  Epley Manuever Right       EPLEY MANUEVER RIGHT   Number of Reps   2  Overall Response  Symptoms Resolved    Response Details   No  nystagmus viewed on second repetition            PT Education - 03/05/18 1518    Education Details  nature of BPPV and treatment expectations    Person(s) Educated  Patient;Spouse    Methods  Explanation    Comprehension  Verbalized understanding          PT Long Term Goals - 03/05/18 2133      PT LONG TERM GOAL #1   Title  The patient will have negative positional testing indicating resolution of BPPV.    Time  4    Period  Weeks    Target Date  04/19/18      PT LONG TERM GOAL #2   Title  The patient will subjectively report no dizziness when moving supine>sit in the morning.    Time  4    Period  Weeks    Target Date  04/19/18      PT LONG TERM GOAL #3   Title  The patient will return demo [place] for fall prevention due to general decline in mobility.    Time  4    Period  Weeks    Target Date  04/19/18      PT LONG TERM GOAL #4   Title  The patient will be further assessed on gait speed, Berg, and 5 time sit<>stand and goals to follow (after vertigo treated).    Time  4    Period  Weeks    Target Date  04/19/18      PT LONG TERM GOAL #5   Title  The patient will return demo HEP for trunk/core stability due to back pain since fall in 10/2017.      Time  4    Period  Weeks    Target Date  04/19/18             Plan - 03/05/18 2139    Clinical Impression Statement  The patient is a 72 year old male with declining mobility presenting today with significant R posterior canalithiasis BPPV.  He also had a downbeat nystagmus noted with L sidelying test with low amplitude nystagmus lasting 90 seconds.  PT initiated treatment on the R posterior canal with Epley's maneuver today and will reassess positional testing next visit.  The patient also has significant back pain and declining mobility per report.  PT to further assess patient's balance and mobility once BPPV treated in order to establish HEP and improve general mobility.     History and Personal Factors  relevant to plan of care:  Recent injuries ( T6 and T8 fxs) with syncopal episode in 10/2017.  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    Clinical Presentation  Evolving    Clinical Presentation due to:  high fall risk    Clinical Decision Making  Low    Rehab Potential  Good    PT Frequency  2x / week   eval   PT Duration  4 weeks    PT Treatment/Interventions  ADLs/Self Care Home Management;Balance training;Neuromuscular re-education;Patient/family education;Gait training;Stair training;Functional mobility training;Canalith Repostioning;Vestibular;Therapeutic activities;Therapeutic exercise;Manual techniques    PT Next Visit Plan  Check positional testing and treat as indicated, assess balance/gait speed/5 time sit<>stand and initiate HEP.  Discuss compliance and ways to incorporate exercises into daily activities.    Consulted and Agree with Plan of Care  Patient  Patient will benefit from skilled therapeutic intervention in order to improve the following deficits and impairments:  Abnormal gait, Decreased activity tolerance, Decreased balance, Pain, Dizziness, Decreased mobility, Decreased strength, Decreased endurance  Visit Diagnosis: BPPV (benign paroxysmal positional vertigo), right  Other abnormalities of gait and mobility  Muscle weakness (generalized)  Unsteadiness on feet     Problem List Patient Active Problem List   Diagnosis Date Noted  . BPPV (benign paroxysmal positional vertigo), right 02/10/2018  . Osteoporosis 11/19/2017  . Syncope 11/02/2017  . Thoracic compression fracture ([place]) 11/02/2017  . Essential hypertension 11/02/2017  . Venous stasis dermatitis of both lower extremities 11/02/2017  . Forearm laceration, right, initial encounter 11/02/2017  . Chronic respiratory failure with hypoxia ([place]) 09/23/2017  . Pleuritic pain 07/17/2017  . BPH associated with nocturia 06/25/2017  . Leukocytosis 12/19/2016  . Hyperglycemia 06/12/2016  .  Hyperlipidemia 07/22/2014  . Onychomycosis 07/22/2014  . Left ankle swelling 07/22/2014  . History of skin cancer 05/17/2014  . Chronic rhinitis 10/28/2012  . Pulmonary nodule 09/23/2011  . Former smoker 08/23/2008  . COPD (chronic obstructive pulmonary disease) with emphysema ([place]) 08/23/2008  . History of colonic polyps 08/23/2008    Kaysee Hergert, PT 03/05/2018, 9:55 PM  [facility] [address] [place], [place], [address] Phone: [phone]   Fax:  [phone]  Name: ENOS MUHL MRN: [id number] Date of Birth: 10/29/1945

## 2018-03-11 ENCOUNTER — Ambulatory Visit: Payer: Medicare Other | Admitting: Rehabilitative and Restorative Service Providers"

## 2018-03-12 ENCOUNTER — Ambulatory Visit: Payer: Medicare Other | Attending: Family Medicine | Admitting: Rehabilitative and Restorative Service Providers"

## 2018-03-12 ENCOUNTER — Encounter: Payer: Self-pay | Admitting: Rehabilitative and Restorative Service Providers"

## 2018-03-12 DIAGNOSIS — M6281 Muscle weakness (generalized): Secondary | ICD-10-CM

## 2018-03-12 DIAGNOSIS — R2689 Other abnormalities of gait and mobility: Secondary | ICD-10-CM | POA: Diagnosis present

## 2018-03-12 DIAGNOSIS — R2681 Unsteadiness on feet: Secondary | ICD-10-CM

## 2018-03-12 DIAGNOSIS — H8111 Benign paroxysmal vertigo, right ear: Secondary | ICD-10-CM | POA: Diagnosis not present

## 2018-03-12 NOTE — Patient Instructions (Signed)
Access Code: [email or web address]  URL: [email or web address]  Date: 03/12/2018  Prepared by: Rudell Cobb   Exercises Brandt-Daroff Vestibular Exercise - 5 reps - 1 sets - 2-3x daily - 7x weekly  Also provided handout on [place] (ankle pumps and long arc quads to begin)

## 2018-03-12 NOTE — Therapy (Signed)
[facility] [address] [place], [place], [address] Phone: [phone]   Fax:  [phone]  Physical Therapy Treatment  Patient Details  Name: Francisco [name] MRN: [id number] Date of Birth: [birth year] Referring Provider: Garret Reddish, DO   Encounter Date: 03/12/2018  PT End of Session - 03/12/18 0843    Visit Number  2    Number of Visits  9    Date for PT Re-Evaluation  04/19/18   due to schedule availability   Authorization Type  [employer] medicare    PT Start Time  0801    PT Stop Time  0847    PT Time Calculation (min)  46 min    Activity Tolerance  Patient tolerated treatment well    Behavior During Therapy  [facility] for tasks assessed/performed       Past Medical History:  Diagnosis Date  . Chest x-ray abnormality   . Chronic rhinitis    -Sinus Ct 08/01/2009 >> Bilateral maxillary sinusitis with some mucosal thickeningin the sphenoid and frontal sinuses as well with air fluid levels present -chronic rhinitis flyer Aug 04, 2009  . COPD (chronic obstructive pulmonary disease) [facility])    PFT's rec Jul 17, 2009  . Onychomycosis    Dr. Judi Cong    Past Surgical History:  Procedure Laterality Date  . APPENDECTOMY    . TONSILLECTOMY      There were no vitals filed for this visit.  Subjective Assessment - 03/12/18 0800    Subjective  The patient notes that Thursday after treatment, he was better.  On Friday, he had to use a walker because he was so unsteady.  He is noting variable dizziness each day.  "I don't know what is going on."   Patient notes his dizziness began after having a thorough ear cleaning.      Pertinent History  Recent injuries ( T6 and T8 fxs) with syncopal episode in 10/2017.  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    Patient Stated Goals  Get rid of dizziness.     Currently in Pain?  Yes    Effect of Pain on Daily Activities  Pain is chronic in nature.  PT will monitor response to  treatment and modify treatment as needed for tolerance.             Vestibular Assessment - 03/12/18 0810      Vestibular Assessment   General Observation  Patient notes he has had intermittent vertigo since last visit.      Symptom Behavior   Type of Dizziness  Spinning      Positional Testing   Dix-Hallpike  Dix-Hallpike Right;Dix-Hallpike Left    Horizontal Canal Testing  Horizontal Canal Right;Horizontal Canal Left      Dix-Hallpike Right   Dix-Hallpike Right Duration  18 seconds    Dix-Hallpike Right Symptoms  Upbeat, right rotatory nystagmus      Dix-Hallpike Left   Dix-Hallpike Left Duration  0    Dix-Hallpike Left Symptoms  No nystagmus      Horizontal Canal Right   Horizontal Canal Right Duration  10 seconds    Horizontal Canal Right Symptoms  --   upbeat, right *from posterior canal stimulation     Horizontal Canal Left   Horizontal Canal Left Duration  0    Horizontal Canal Left Symptoms  Normal      Orthostatics   BP supine (x 5 minutes)  165/85    HR  supine (x 5 minutes)  84    BP standing (after 1 minute)  134/75    HR standing (after 1 minute)  101    BP standing (after 3 minutes)  152/86    HR standing (after 3 minutes)  96                Vestibular Treatment/Exercise - 03/12/18 0001      Vestibular Treatment/Exercise   Vestibular Treatment Provided  Canalith Repositioning;Habituation    Canalith Repositioning  Epley Manuever Right    Habituation Exercises  Brandt Daroff       EPLEY MANUEVER RIGHT   Number of Reps   2    Overall Response  Symptoms Resolved      Nestor Lewandowsky   Number of Reps   3    Symptom Description   Patient has dec'ing symptoms with repetition.         Balance Exercises - 03/12/18 0843      OTAGO PROGRAM   Ankle Movements  Sitting;10 reps    Knee Extensor  10 reps    Overall OTAGO Comments  Also recommended ankle pumps before getting up and down.         PT Education - 03/12/18 1025     Education Details  HEP: brandt daroff, treatment for BPPV.  Also provided [place] ankle pumps and long arc quads    Person(s) Educated  Patient;Spouse    Methods  Explanation;Handout;Demonstration    Comprehension  Returned demonstration;Verbalized understanding          PT Long Term Goals - 03/05/18 2133      PT LONG TERM GOAL #1   Title  The patient will have negative positional testing indicating resolution of BPPV.    Time  4    Period  Weeks    Target Date  04/19/18      PT LONG TERM GOAL #2   Title  The patient will subjectively report no dizziness when moving supine>sit in the morning.    Time  4    Period  Weeks    Target Date  04/19/18      PT LONG TERM GOAL #3   Title  The patient will return demo [place] for fall prevention due to general decline in mobility.    Time  4    Period  Weeks    Target Date  04/19/18      PT LONG TERM GOAL #4   Title  The patient will be further assessed on gait speed, Berg, and 5 time sit<>stand and goals to follow (after vertigo treated).    Time  4    Period  Weeks    Target Date  04/19/18      PT LONG TERM GOAL #5   Title  The patient will return demo HEP for trunk/core stability due to back pain since fall in 10/2017.      Time  4    Period  Weeks    Target Date  04/19/18            Plan - 03/12/18 0947    Clinical Impression Statement  The patient continues with R posterior canal BPPV.  There was no evidence of the downbeat nystagmus that was viewed at initial evaluation.  PT treated with Epley's maneuver and taught HEP for brandt daroff (with wife's assist and special instructions to rest in between due to h/o orthostasis and + measurements today).  The patient also began  HEP for LE strength in a chair due to general deconditioning (initiating the [place] balance program for HEP).      PT Treatment/Interventions  ADLs/Self Care Home Management;Balance training;Neuromuscular re-education;Patient/family education;Gait  training;Stair training;Functional mobility training;Canalith Repostioning;Vestibular;Therapeutic activities;Therapeutic exercise;Manual techniques    PT Next Visit Plan  Recheck for BPPV; ASSESS:  gait speed, balance.  Progress [place] HEP and discuss HEP compliance and incorporating exercise into daily routine.    Consulted and Agree with Plan of Care  Patient       Patient will benefit from skilled therapeutic intervention in order to improve the following deficits and impairments:  Abnormal gait, Decreased activity tolerance, Decreased balance, Pain, Dizziness, Decreased mobility, Decreased strength, Decreased endurance  Visit Diagnosis: BPPV (benign paroxysmal positional vertigo), right  Other abnormalities of gait and mobility  Muscle weakness (generalized)  Unsteadiness on feet     Problem List Patient Active Problem List   Diagnosis Date Noted  . BPPV (benign paroxysmal positional vertigo), right 02/10/2018  . Osteoporosis 11/19/2017  . Syncope 11/02/2017  . Thoracic compression fracture ([place]) 11/02/2017  . Essential hypertension 11/02/2017  . Venous stasis dermatitis of both lower extremities 11/02/2017  . Forearm laceration, right, initial encounter 11/02/2017  . Chronic respiratory failure with hypoxia ([place]) 09/23/2017  . Pleuritic pain 07/17/2017  . BPH associated with nocturia 06/25/2017  . Leukocytosis 12/19/2016  . Hyperglycemia 06/12/2016  . Hyperlipidemia 07/22/2014  . Onychomycosis 07/22/2014  . Left ankle swelling 07/22/2014  . History of skin cancer 05/17/2014  . Chronic rhinitis 10/28/2012  . Pulmonary nodule 09/23/2011  . Former smoker 08/23/2008  . COPD (chronic obstructive pulmonary disease) with emphysema ([place]) 08/23/2008  . History of colonic polyps 08/23/2008    Idelia Caudell, PT 03/12/2018, 9:50 AM  [facility] [address] [place], [place], [address] Phone: [phone]   Fax:   [phone]  Name: Francisco [name] MRN: [id number] Date of Birth: [birth year]

## 2018-03-19 ENCOUNTER — Encounter

## 2018-03-20 ENCOUNTER — Encounter: Payer: Self-pay | Admitting: Rehabilitative and Restorative Service Providers"

## 2018-03-20 ENCOUNTER — Ambulatory Visit: Payer: Medicare Other | Admitting: Rehabilitative and Restorative Service Providers"

## 2018-03-20 DIAGNOSIS — H8111 Benign paroxysmal vertigo, right ear: Secondary | ICD-10-CM | POA: Diagnosis not present

## 2018-03-20 DIAGNOSIS — R2689 Other abnormalities of gait and mobility: Secondary | ICD-10-CM

## 2018-03-20 DIAGNOSIS — R2681 Unsteadiness on feet: Secondary | ICD-10-CM

## 2018-03-20 DIAGNOSIS — M6281 Muscle weakness (generalized): Secondary | ICD-10-CM

## 2018-03-20 NOTE — Patient Instructions (Signed)
Thoracic Self-Mobilization (Supine)    With rolled towel placed lengthwise BETWEEN SHOULDER BLADES lie back on towel with arms outstretched. Hold __2 MINUTES. Relax. Repeat _1__ times per set. Do __1__ sets per session. Do __1-2__ sessions per day.  http://orth.exer.us/1000   Copyright  VHI. All rights reserved.

## 2018-03-20 NOTE — Therapy (Signed)
[facility] [address] [place], [place], [address] Phone: [phone]   Fax:  [phone]  Physical Therapy Treatment  Patient Details  Name: Francisco [name] MRN: [id number] Date of Birth: 07/31/1945 Referring Provider: Garret Reddish, DO   Encounter Date: 03/20/2018  PT End of Session - 03/20/18 1319    Visit Number  3    Number of Visits  9    Date for PT Re-Evaluation  04/19/18   due to schedule availability   Authorization Type  [employer] medicare    PT Start Time  1315    PT Stop Time  1400    PT Time Calculation (min)  45 min    Activity Tolerance  Patient tolerated treatment well    Behavior During Therapy  [facility] for tasks assessed/performed       Past Medical History:  Diagnosis Date  . Chest x-ray abnormality   . Chronic rhinitis    -Sinus Ct 08/01/2009 >> Bilateral maxillary sinusitis with some mucosal thickeningin the sphenoid and frontal sinuses as well with air fluid levels present -chronic rhinitis flyer Aug 04, 2009  . COPD (chronic obstructive pulmonary disease) [facility])    PFT's rec Jul 17, 2009  . Onychomycosis    Dr. Judi Cong    Past Surgical History:  Procedure Laterality Date  . APPENDECTOMY    . TONSILLECTOMY      There were no vitals filed for this visit.  Subjective Assessment - 03/20/18 1315    Subjective  The patient notes that he still continues with dizziness when lying down to the right side.    Pertinent History  Recent injuries ( T6 and T8 fxs) with syncopal episode in 10/2017.  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    Patient Stated Goals  Get rid of dizziness.     Currently in Pain?  No/denies   took a pain pill this morning        [facility] PT Assessment - 03/20/18 0001      Ambulation/Gait   Ambulation/Gait  Yes    Ambulation/Gait Assistance  5: Supervision    Ambulation Distance (Feet)  115 Feet    Assistive device  --   none; 2L oxygen   Ambulation Surface   Level;Indoor    Gait velocity  2.06 ft/sec      Standardized Balance Assessment   Standardized Balance Assessment  Berg Balance Test      Berg Balance Test   Sit to Stand  Able to stand  independently using hands    Standing Unsupported  Able to stand 2 minutes with supervision    Sitting with Back Unsupported but Feet Supported on Floor or Stool  Able to sit safely and securely 2 minutes    Stand to Sit  Controls descent by using hands    Transfers  Able to transfer safely, definite need of hands    Standing Unsupported with Eyes Closed  Able to stand 10 seconds with supervision    Standing Ubsupported with Feet Together  Able to place feet together independently and stand for 1 minute with supervision    From Standing, Reach Forward with Outstretched Arm  Reaches forward but needs supervision    From Standing Position, Pick up Object from Floor  Able to pick up shoe, needs supervision    From Standing Position, Turn to Look Behind Over each Shoulder  Needs supervision when turning    Turn 360 Degrees  Needs  assistance while turning    Standing Unsupported, Alternately Place Feet on Step/Stool  Needs assistance to keep from falling or unable to try    Standing Unsupported, One Foot in [place] to take small step independently and hold 30 seconds    Standing on One Leg  Unable to try or needs assist to prevent fall    Total Score  29    Berg comment:  29/56         Vestibular Assessment - 03/20/18 1319      Positional Testing   Sidelying Test  Sidelying Right;Sidelying Left      Sidelying Right   Sidelying Right Duration  30 seconds    Sidelying Right Symptoms  Upbeat, right rotatory nystagmus   large amplitude nystagmus     Sidelying Left   Sidelying Left Duration  0    Sidelying Left Symptoms  No nystagmus               [facility] Adult PT Treatment/Exercise - 03/20/18 0001      Exercises   Exercises  Other Exercises    Other Exercises   supine chest stretch  with towel roll       Vestibular Treatment/Exercise - 03/20/18 1323      Vestibular Treatment/Exercise   Vestibular Treatment Provided  Habituation;Canalith Repositioning    Canalith Repositioning  Semont Procedure Right Posterior   liberatory R posterior   Habituation Exercises  Laruth Bouchard Daroff       EPLEY MANUEVER RIGHT   Number of Reps   --      Semont Procedure Right Posterior   Number of Reps   1    Overall Response   Symptoms Resolved    Response Details   Retested with R sidelying with trace sensation dizziness could come on      [employer]   Number of Reps   5    Symptom Description   first rep to right, lasts 20 sec, 2nd rep 15 sec,, 3rd and 4th rep 10 sec, 5th rep 10 seconds.              PT Education - 03/20/18 1557    Education Details  Added chest stretch supine wiht towel roll    Person(s) Educated  Patient;Spouse    Methods  Explanation;Demonstration;Handout    Comprehension  Verbalized understanding;Returned demonstration          PT Long Term Goals - 03/20/18 1602      PT LONG TERM GOAL #1   Title  The patient will have negative positional testing indicating resolution of BPPV.    Time  4    Period  Weeks      PT LONG TERM GOAL #2   Title  The patient will subjectively report no dizziness when moving supine>sit in the morning.    Time  4    Period  Weeks      PT LONG TERM GOAL #3   Title  The patient will return demo [place] for fall prevention due to general decline in mobility.    Time  4    Period  Weeks      PT LONG TERM GOAL #4   Title  The patient will be further assessed on gait speed, Berg, and 5 time sit<>stand and goals to follow (after vertigo treated).    Baseline  Berg=29/56, gait speed=2.06 ft/sec    Time  4    Period  Weeks  PT LONG TERM GOAL #5   Title  The patient will return demo HEP for trunk/core stability due to back pain since fall in 10/2017.      Time  4    Period  Weeks            Plan -  03/20/18 1601    Clinical Impression Statement  The patient is continuing with persistent R BPPV.  PT reviewed brandt daroff, and then treated with semont/liberatory maneuver for R posterior canal.  Patient seemed to respond to this canolith repositioning maneuver.  Therefore, was able to progress to walking, and Berg balance testing today.  Also provided HEP for chest stretch/posture.    PT Treatment/Interventions  ADLs/Self Care Home Management;Balance training;Neuromuscular re-education;Patient/family education;Gait training;Stair training;Functional mobility training;Canalith Repostioning;Vestibular;Therapeutic activities;Therapeutic exercise;Manual techniques    PT Next Visit Plan  Recheck for BPPV; Progress [place] HEP and discuss HEP compliance and incorporating exercise into daily routine.  Work towards progression to pulmonary rehab    Consulted and Agree with Plan of Care  Patient;Family member/caregiver    Family Member Consulted  spouse       Patient will benefit from skilled therapeutic intervention in order to improve the following deficits and impairments:  Abnormal gait, Decreased activity tolerance, Decreased balance, Pain, Dizziness, Decreased mobility, Decreased strength, Decreased endurance  Visit Diagnosis: BPPV (benign paroxysmal positional vertigo), right  Other abnormalities of gait and mobility  Muscle weakness (generalized)  Unsteadiness on feet     Problem List Patient Active Problem List   Diagnosis Date Noted  . BPPV (benign paroxysmal positional vertigo), right 02/10/2018  . Osteoporosis 11/19/2017  . Syncope 11/02/2017  . Thoracic compression fracture ([place]) 11/02/2017  . Essential hypertension 11/02/2017  . Venous stasis dermatitis of both lower extremities 11/02/2017  . Forearm laceration, right, initial encounter 11/02/2017  . Chronic respiratory failure with hypoxia ([place]) 09/23/2017  . Pleuritic pain 07/17/2017  . BPH associated with nocturia  06/25/2017  . Leukocytosis 12/19/2016  . Hyperglycemia 06/12/2016  . Hyperlipidemia 07/22/2014  . Onychomycosis 07/22/2014  . Left ankle swelling 07/22/2014  . History of skin cancer 05/17/2014  . Chronic rhinitis 10/28/2012  . Pulmonary nodule 09/23/2011  . Former smoker 08/23/2008  . COPD (chronic obstructive pulmonary disease) with emphysema ([place]) 08/23/2008  . History of colonic polyps 08/23/2008    Cosette Prindle, PT 03/20/2018, 4:04 PM  [facility] [address] [place], [place], [address] Phone: [phone]   Fax:  [phone]  Name: Francisco [name] MRN: [id number] Date of Birth: 1945/10/26

## 2018-03-27 ENCOUNTER — Ambulatory Visit: Payer: Medicare Other | Admitting: Rehabilitative and Restorative Service Providers"

## 2018-03-27 ENCOUNTER — Encounter: Payer: Self-pay | Admitting: Rehabilitative and Restorative Service Providers"

## 2018-03-27 VITALS — BP 154/87 | HR 90

## 2018-03-27 DIAGNOSIS — R2681 Unsteadiness on feet: Secondary | ICD-10-CM

## 2018-03-27 DIAGNOSIS — H8111 Benign paroxysmal vertigo, right ear: Secondary | ICD-10-CM | POA: Diagnosis not present

## 2018-03-27 DIAGNOSIS — R2689 Other abnormalities of gait and mobility: Secondary | ICD-10-CM

## 2018-03-27 DIAGNOSIS — M6281 Muscle weakness (generalized): Secondary | ICD-10-CM

## 2018-03-27 NOTE — Therapy (Signed)
[facility] [address] [place], [place], [address] Phone: [phone]   Fax:  [phone]  Physical Therapy Treatment  Patient Details  Name: Francisco [name] MRN: [id number] Date of Birth: 06/25/1946 Referring Provider: Garret Reddish, DO   Encounter Date: 03/27/2018  PT End of Session - 03/27/18 1014    Visit Number  4    Number of Visits  9    Date for PT Re-Evaluation  04/19/18   due to schedule availability   Authorization Type  [employer] medicare    PT Start Time  0848    PT Stop Time  0933    PT Time Calculation (min)  45 min    Activity Tolerance  Patient tolerated treatment well    Behavior During Therapy  [facility] for tasks assessed/performed       Past Medical History:  Diagnosis Date  . Chest x-ray abnormality   . Chronic rhinitis    -Sinus Ct 08/01/2009 >> Bilateral maxillary sinusitis with some mucosal thickeningin the sphenoid and frontal sinuses as well with air fluid levels present -chronic rhinitis flyer Aug 04, 2009  . COPD (chronic obstructive pulmonary disease) [facility])    PFT's rec Jul 17, 2009  . Onychomycosis    Dr. Judi Cong    Past Surgical History:  Procedure Laterality Date  . APPENDECTOMY    . TONSILLECTOMY      Vitals:   03/27/18 0923 03/27/18 0926  BP: (!) 178/96 (!) 154/87  Pulse:  90    Subjective Assessment - 03/27/18 0848    Subjective  The patient reports that he gets vertigo each time he lays down to the right.  He reported that it is not lasting as long with home exercises.   The patient notes that his back was bothering him with supine exericse so he stopped that one.    Pertinent History  Recent injuries ( T6 and T8 fxs) with syncopal episode in 10/2017.  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    Patient Stated Goals  Get rid of dizziness.     Currently in Pain?  No/denies             Vestibular Assessment - 03/27/18 0853      Positional Testing   Sidelying Test  Sidelying Right;Sidelying Left      Sidelying Right   Sidelying Right Duration  4 second latency, 20 seconds    Sidelying Right Symptoms  Upbeat, right rotatory nystagmus               [facility] Adult PT Treatment/Exercise - 03/27/18 1015      Ambulation/Gait   Ambulation/Gait  Yes    Ambulation/Gait Assistance  5: Supervision    Ambulation Distance (Feet)  100 Feet    Gait Comments  Patient c/o lightheadedness after canolith repositioning maneuvers.  PT assessed BP, had patient rest (performed education on walking program) and reassessed.  PT accompanied patient to front office to ensure       Self-Care   Self-Care  Other Self-Care Comments    Other Self-Care Comments   Patient and PT discussed holding brandt daroff habituation HEP at this time (until reassessment of positional vertigo on Monday).  HOME WALKING PROGRAM:  Recommended 5 minutes of ambulation (with walker if he feels more confident) in home each hour between 10-2 (4 times/day).  We discussed slowly increasing mobility and goal to consider pulmonary rehab once PT d/c.  Vestibular Treatment/Exercise - 03/27/18 0856      Vestibular Treatment/Exercise   Vestibular Treatment Provided  Habituation;Canalith Repositioning    Canalith Repositioning  Epley Manuever Right;Semont Procedure Right Posterior    Habituation Exercises  Nestor Lewandowsky       EPLEY MANUEVER RIGHT   Number of Reps   1    Overall Response  Improved Symptoms    Response Details   Performed epley's maneuver after liberatory/semont.  There were no nystagmus viewed in room light.  Used vibration before maneuver and at 3rd position on R mastoid bone.      Semont Procedure Right Posterior   Number of Reps   1    Overall Response   Symptoms Resolved    Response Details   Tolerated well with increased speed today.  Used vibration before maneuver and at 2nd position.      Nestor Lewandowsky   Number of Reps   5   5 reps to right and 1 rep to  left   Symptom Description   1st rep- 4 second latency/20 second duration.  2nd rep=immediate onset, 8 second duration; 3rd rep=2 second latency/ 8 second duration.  4th rep=2 second latency/10 second duration.  5th rep=2nd latency/10 second duration.            PT Education - 03/27/18 1035    Education Details  home walking     Person(s) Educated  Patient    Methods  Explanation    Comprehension  Verbalized understanding          PT Long Term Goals - 03/27/18 1014      PT LONG TERM GOAL #1   Title  The patient will have negative positional testing indicating resolution of BPPV.    Time  4    Period  Weeks    Target Date  04/19/18      PT LONG TERM GOAL #2   Title  The patient will subjectively report no dizziness when moving supine>sit in the morning.    Time  4    Period  Weeks      PT LONG TERM GOAL #3   Title  The patient will return demo [place] for fall prevention due to general decline in mobility.    Time  4    Period  Weeks      PT LONG TERM GOAL #4   Title  The patient will be further assessed on gait speed, Berg, and 5 time sit<>stand and goals to follow (after vertigo treated).    Baseline  Berg=29/56, gait speed=2.06 ft/sec    Time  4    Period  Weeks      PT LONG TERM GOAL #5   Title  The patient will return demo HEP for trunk/core stability due to back pain since fall in 10/2017.      Time  4    Period  Weeks            Plan - 03/27/18 1035    Clinical Impression Statement  The patient presented today with continued positive R sidelying test for upbeating, rotary nystagmus. Nystagmus clears with performance of liberatory maneuver.  Both liberatory and Epley's maneuvers completed today for canolith repositioning and PT also added vibration to try to improve effectiveness of maneuver.   Patient is negative at end of session for nystagmus during R sidelying test.  Patient reported lightheadedness with standing up after maneuvers (when we were  gong to progress to balance/gait  activities).  Patient's BP elevated.  Rested and discussed home walking plan and rechecked BP (which had decreased).     PT Treatment/Interventions  ADLs/Self Care Home Management;Balance training;Neuromuscular re-education;Patient/family education;Gait training;Stair training;Functional mobility training;Canalith Repostioning;Vestibular;Therapeutic activities;Therapeutic exercise;Manual techniques    PT Next Visit Plan  Recheck for BPPV; Progress [place] HEP and discuss HEP compliance and incorporating exercise into daily routine.  Work towards progression to pulmonary rehab    Consulted and Agree with Plan of Care  Patient;Family member/caregiver    Family Member Consulted  spouse       Patient will benefit from skilled therapeutic intervention in order to improve the following deficits and impairments:  Abnormal gait, Decreased activity tolerance, Decreased balance, Pain, Dizziness, Decreased mobility, Decreased strength, Decreased endurance  Visit Diagnosis: BPPV (benign paroxysmal positional vertigo), right  Other abnormalities of gait and mobility  Muscle weakness (generalized)  Unsteadiness on feet     Problem List Patient Active Problem List   Diagnosis Date Noted  . BPPV (benign paroxysmal positional vertigo), right 02/10/2018  . Osteoporosis 11/19/2017  . Syncope 11/02/2017  . Thoracic compression fracture ([place]) 11/02/2017  . Essential hypertension 11/02/2017  . Venous stasis dermatitis of both lower extremities 11/02/2017  . Forearm laceration, right, initial encounter 11/02/2017  . Chronic respiratory failure with hypoxia ([place]) 09/23/2017  . Pleuritic pain 07/17/2017  . BPH associated with nocturia 06/25/2017  . Leukocytosis 12/19/2016  . Hyperglycemia 06/12/2016  . Hyperlipidemia 07/22/2014  . Onychomycosis 07/22/2014  . Left ankle swelling 07/22/2014  . History of skin cancer 05/17/2014  . Chronic rhinitis 10/28/2012  . Pulmonary  nodule 09/23/2011  . Former smoker 08/23/2008  . COPD (chronic obstructive pulmonary disease) with emphysema ([place]) 08/23/2008  . History of colonic polyps 08/23/2008    Idabell Picking, PT 03/27/2018, 10:40 AM  [facility] [address] [place], [place], [address] Phone: [phone]   Fax:  [phone]  Name: ASEEL UHDE MRN: [id number] Date of Birth: Sep 13, 1945

## 2018-03-30 ENCOUNTER — Ambulatory Visit: Payer: Medicare Other | Admitting: Rehabilitative and Restorative Service Providers"

## 2018-03-30 NOTE — Progress Notes (Signed)
PCP notes:   Health maintenance:Flu: Received today. Discussed Shingrix.   Abnormal screenings: Patient states he is still experiencing vertigo, still in physical therapy for this. Blood pressure has been increased during physical therapy.    Patient concerns: None.    Nurse concerns: none.    Next PCP appt: Will schedule.

## 2018-03-30 NOTE — Progress Notes (Signed)
Subjective:   Francisco [name] is a 72 y.o. male who presents for an Initial Medicare Annual Wellness Visit.  Lives in one story home with wife.   Sleeps 5-6 hrs/night. Wakes up feeling rested for the most part. Daily naps. Gets up to use restroom throughout night.  Review of Systems  No ROS.  Medicare Wellness Visit. Additional risk factors are reflected in the social history.  Cardiac Risk Factors include: advanced age (>96men, >27 women);male gender;hypertension    Objective:    Today's Vitals   03/31/18 0824  BP: 125/80  Pulse: 93  Resp: 16  Temp: 98.1 F (36.7 C)  TempSrc: Oral  SpO2: 96%  Weight: 155 lb 6.4 oz (70.5 kg)  Height: 6' (1.829 m)   Body mass index is 21.08 kg/m.  Advanced Directives 03/31/2018  Does Patient Have a Medical Advance Directive? Yes  Type of Paramedic of [place];Living will  Does patient want to make changes to medical advance directive? No - Patient declined  Copy of [place] in Chart? No - copy requested    Current Medications (verified) Outpatient Encounter Medications as of 03/31/2018  Medication Sig  . albuterol (VENTOLIN HFA) 108 (90 Base) MCG/ACT inhaler USE 2 PUFFS EVERY 6 HOURS  AS NEEDED FOR WHEEZING  . alendronate (FOSAMAX) 70 MG tablet Take 1 tablet (70 mg total) by mouth once a week. Take with a full glass of water on an empty stomach.  [name] Kitchen azithromycin (ZITHROMAX) 250 MG tablet TAKE 1 TABLET BY MOUTH ON  MONDAY, WEDNESDAY, AND  FRIDAY  . Calcium Carbonate-Vit D-Min (CALCIUM 1200 PO) Take 1,200 mg by mouth daily.  . Cholecalciferol (VITAMIN D3) 1000 units CAPS Take 1,000 Units by mouth daily.  . fluticasone (FLONASE) 50 MCG/ACT nasal spray USE 2 SPRAYS IN EACH  NOSTRIL DAILY  . furosemide (LASIX) 20 MG tablet Take 1 tablet (20 mg total) by mouth daily as needed for edema.  [name] Kitchen HYDROcodone-acetaminophen (NORCO/VICODIN) 5-325 MG tablet Take 1-2 tablets by mouth every 4 (four) hours as  needed for moderate pain.  . Misc. Devices (PULSE OXIMETER FOR FINGER) MISC 1 Device by Does not apply route as needed.  . OXYGEN Inhale 2 L into the lungs continuous. 2L O2 at rest and 3L O2 with exertion.  . predniSONE (DELTASONE) 10 MG tablet TAKE 1 TABLET BY MOUTH  DAILY WITH BREAKFAST  . SPIRIVA RESPIMAT 2.5 MCG/ACT AERS USE 2 SPRAYS (INHALATIONS)  ONCE DAILY  . SYMBICORT 160-4.5 MCG/ACT inhaler USE 2 PUFFS TWO TIMES DAILY  . terbinafine (LAMISIL) 250 MG tablet terbinafine HCl 250 mg tablet  . Fluticasone-Umeclidin-Vilant (TRELEGY ELLIPTA) 100-62.5-25 MCG/INH AEPB Inhale 1 puff into the lungs daily. (Patient not taking: Reported on 03/31/2018)   No facility-administered encounter medications on file as of 03/31/2018.     Allergies (verified) Tape   History: Past Medical History:  Diagnosis Date  . Chest x-ray abnormality   . Chronic rhinitis    -Sinus Ct 08/01/2009 >> Bilateral maxillary sinusitis with some mucosal thickeningin the sphenoid and frontal sinuses as well with air fluid levels present -chronic rhinitis flyer Aug 04, 2009  . COPD (chronic obstructive pulmonary disease) [facility])    PFT's rec Jul 17, 2009  . Onychomycosis    Dr. Judi Cong   Past Surgical History:  Procedure Laterality Date  . APPENDECTOMY    . TONSILLECTOMY     Family History  Problem Relation Age of Onset  . Heart disease Mother  CABG in her 35s, nonsmoker  . Cancer Mother   . Stroke Father   . Heart disease Father        Died of MI at age 45, smoker  . Hepatitis Sister   . Coronary artery disease Other        male 1st degree relative <60   Social History   Socioeconomic History  . Marital status: Married    Spouse name: Not on file  . Number of children: 2  . Years of education: 3 years of college  . Highest education level: Not on file  Occupational History    Employer: [employer]  . Financial resource strain: Not on file  . Food insecurity:    Worry: Not on file     Inability: Not on file  . Transportation needs:    Medical: Not on file    Non-medical: Not on file  Tobacco Use  . Smoking status: Former Smoker    Packs/day: 2.00    Years: 10.00    Pack years: 20.00    Types: Pipe, Cigarettes    Start date: 10/06/2017  . Smokeless tobacco: Never Used  . Tobacco comment: Smokes pipe up to 2-3 times a day  Substance and Sexual Activity  . Alcohol use: Yes    Alcohol/week: 18.0 standard drinks    Types: 18 Cans of beer per week    Comment: daily  . Drug use: No  . Sexual activity: Not on file  Lifestyle  . Physical activity:    Days per week: Not on file    Minutes per session: Not on file  . Stress: Not on file  Relationships  . Social connections:    Talks on phone: Not on file    Gets together: Not on file    Attends religious service: Not on file    Active member of club or organization: Not on file    Attends meetings of clubs or organizations: Not on file    Relationship status: Not on file  Other Topics Concern  . Not on file  Social History Narrative   Married (wife Katy Apo patient of Dr. Yong Channel) with children (2-son and daughter). 1 grandson.       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       Hobbies: fishing, projects at home   Tobacco Counseling Counseling given: Not Answered Comment: Smokes pipe up to 2-3 times a day  Activities of Daily Living In your present state of health, do you have any difficulty performing the following activities: 03/31/2018  Hearing? N  Vision? N  Difficulty concentrating or making decisions? N  Walking or climbing stairs? Y  Comment wears oxygen  Dressing or bathing? N  Doing errands, shopping? N  Preparing Food and eating ? N  Using the Toilet? N  In the past six months, have you accidently leaked urine? N  Do you have problems with loss of bowel control? N  Managing your Medications? N  Managing your Finances? N  Housekeeping or managing your  Housekeeping? N  Some recent data might be hidden     Immunizations and Health Maintenance Immunization History  Administered Date(s) Administered  . Influenza Split 04/24/2012  . Influenza Whole 05/08/2009, 04/08/2011  . Influenza, High Dose Seasonal PF 05/01/2017, 03/31/2018  . Influenza,inj,Quad PF,6+ Mos 04/27/2013, 04/15/2014, 04/24/2015  . Influenza-Unspecified 05/13/2016  . Pneumococcal Conjugate-13 02/25/2014  . Pneumococcal Polysaccharide-23 08/15/2011, 12/19/2016  . Pneumococcal-Unspecified  03/08/2017  . Td 02/21/2010  . Tdap 11/05/2017  . Zoster 01/29/2012   There are no preventive care reminders to display for this patient.  Patient Care Team: [name], MD as PCP - General (Family Medicine)  Indicate any recent Medical Services you may have received from other than [facility] providers in the past year (date may be approximate).    Assessment:   This is a routine wellness examination for Jiyan.  Hearing/Vision screen Hearing Screening Comments: Able to hear conversational tones w/o difficulty. No issues reported.    Vision Screening Comments: May 2019, Dr Winnifred Friar. Wears bifocals.  Dietary issues and exercise activities discussed: Current Exercise Habits: Structured exercise class, Type of exercise: Other - see comments(pulmonary rehab ), Time (Minutes): 45, Frequency (Times/Week): 2, Weekly Exercise (Minutes/Week): 90, Intensity: Moderate, Exercise limited by: None identified  States he does not drink enough water.  Drinks 2 gingerales/day.  Breakfast: Changes, sometimes cooked at home, sometimes out to eat. Late Lunch: Same as above.  Snack: Cookie or dessert.  States he eats enough protein.   Goals    . Increase daily acitivity       Depression Screen PHQ 2/9 Scores 03/31/2018 06/25/2017 06/12/2016 04/24/2015  PHQ - 2 Score 0 0 0 0  PHQ- 9 Score 0 - - -   PHQ2 and PHQ9 completed. Score 0. No signs or symptoms of depression. States he enjoys doing as  much as he can. Looks forward to continuing physical therapy so that he can do more physically. Total time spent on topic was 9 minutes.  Fall Risk Fall Risk  03/31/2018 06/25/2017 06/12/2016 04/24/2015 02/25/2014  Falls in the past year? Yes No No No No  Number falls in past yr: 2 or more - - - -  Injury with Fall? Yes - - - -  Risk Factor Category  High Fall Risk - - - -  Risk for fall due to : History of fall(s) - - - -  Follow up Education provided;Falls prevention discussed - - - -   Cognitive Function: Ad8 score reviewed for issues:  Issues making decisions:no  Less interest in hobbies / activities:no  Repeats questions, stories (family complaining):no  Trouble using ordinary gadgets (microwave, computer, phone):no  Forgets the month or year: no  Mismanaging finances: no  Remembering appts:no  Daily problems with thinking and/or memory:no Ad8 score is=0 Does not participate in brain stimulating activities.         Screening Tests Health Maintenance  Topic Date Due  . Hepatitis C Screening  07/04/2098 (Originally June 09, 1946)  . COLONOSCOPY  04/02/2020  . TETANUS/TDAP  11/06/2027  . INFLUENZA VACCINE  Completed  . PNA vac Low Risk Adult  Completed      Plan:   Follow up with PCP as directed.  I have personally reviewed and noted the following in the patient's chart:   . Medical and social history . Use of alcohol, tobacco or illicit drugs  . Current medications and supplements . Functional ability and status . Nutritional status . Physical activity . Advanced directives . List of other physicians . Vitals . Screenings to include cognitive, depression, and falls . Referrals and appointments  In addition, I have reviewed and discussed with patient certain preventive protocols, quality metrics, and best practice recommendations. A written personalized care plan for preventive services as well as general preventive health recommendations were provided to  patient.     Williemae Area, RN   03/31/2018

## 2018-03-31 ENCOUNTER — Ambulatory Visit (INDEPENDENT_AMBULATORY_CARE_PROVIDER_SITE_OTHER): Payer: Medicare Other | Admitting: Family Medicine

## 2018-03-31 ENCOUNTER — Ambulatory Visit: Payer: Medicare Other | Admitting: *Deleted

## 2018-03-31 ENCOUNTER — Encounter: Payer: Self-pay | Admitting: Family Medicine

## 2018-03-31 VITALS — BP 125/80 | HR 93 | Temp 98.1°F | Ht 72.0 in | Wt 155.4 lb

## 2018-03-31 VITALS — BP 125/80 | HR 93 | Temp 98.1°F | Resp 16 | Ht 72.0 in | Wt 155.4 lb

## 2018-03-31 DIAGNOSIS — E785 Hyperlipidemia, unspecified: Secondary | ICD-10-CM

## 2018-03-31 DIAGNOSIS — Z23 Encounter for immunization: Secondary | ICD-10-CM | POA: Diagnosis not present

## 2018-03-31 DIAGNOSIS — G250 Essential tremor: Secondary | ICD-10-CM

## 2018-03-31 DIAGNOSIS — J438 Other emphysema: Secondary | ICD-10-CM

## 2018-03-31 DIAGNOSIS — Z0000 Encounter for general adult medical examination without abnormal findings: Secondary | ICD-10-CM | POA: Diagnosis not present

## 2018-03-31 DIAGNOSIS — H8111 Benign paroxysmal vertigo, right ear: Secondary | ICD-10-CM | POA: Diagnosis not present

## 2018-03-31 MED ORDER — HYDROCODONE-ACETAMINOPHEN 5-325 MG PO TABS: 1.0000 | ORAL_TABLET | ORAL | 0 refills | Status: DC | PRN

## 2018-03-31 MED ORDER — DOXYCYCLINE HYCLATE 100 MG PO TABS
100.0000 mg | ORAL_TABLET | Freq: Two times a day (BID) | ORAL | 0 refills | Status: DC
Start: 2018-03-31 — End: 2018-04-03

## 2018-03-31 NOTE — Progress Notes (Signed)
I have reviewed and agree with note, evaluation, plan. See my separate note from today- he is doing well outside BPPV. We extended our visit interval to 6 month and he will let us know if needs to be seen sooner  Garret Reddish, MD

## 2018-03-31 NOTE — Assessment & Plan Note (Signed)
S: note from PT "Dr. Yong Channel,  Mr. Anne has been seen for 4 visits in physical therapy. He has had persistent R BPPV that did not respond to Epley's maneuver. We have tried liberatory (Semont) maneuver for R posterior canal BPPV, and patient has worked on [employer] in the home. Symptoms seem to be improving, but have not fully resolved.   PT is also working on beginning [employer] for fall prevention, and we have discussed transition to pulmonary rehab at end of our episode of care.  He is scheduled to see you Tuesday 9/24-- wanted to give you an update on his status.  Thank you for the referral of this patient.  Rudell Cobb, MPT "  He missed an appointment yesterday. Has visit on Friday. Has only had 1 mild dizzy/vertigo episode since last PT visit. With position changes, has had some higher blood pressures.  A/P: Resistant case but still seems classic for BPPV- luckily better in last week. We will monitor- I am certainly open to pursue neuroimaging if PT does not think consistent with BPPV

## 2018-03-31 NOTE — Progress Notes (Addendum)
Subjective:  Francisco [name] is a 72 y.o. year old very pleasant male patient who presents for/with See problem oriented charting ROS- baseline SOB. Edema at baseline. No fever or chills. Some cough, congestion, sputum change   Past Medical History-  Patient Active Problem List   Diagnosis Date Noted  . Osteoporosis 11/19/2017    Priority: High  . Thoracic compression fracture ([place]) 11/02/2017    Priority: High  . Former smoker 08/23/2008    Priority: High  . COPD (chronic obstructive pulmonary disease) with emphysema ([place]) 08/23/2008    Priority: High  . BPPV (benign paroxysmal positional vertigo), right 02/10/2018    Priority: Medium  . Essential hypertension 11/02/2017    Priority: Medium  . BPH associated with nocturia 06/25/2017    Priority: Medium  . Hyperglycemia 06/12/2016    Priority: Medium  . Hyperlipidemia 07/22/2014    Priority: Medium  . Onychomycosis 07/22/2014    Priority: Low  . Left ankle swelling 07/22/2014    Priority: Low  . History of skin cancer 05/17/2014    Priority: Low  . Chronic rhinitis 10/28/2012    Priority: Low  . Pulmonary nodule 09/23/2011    Priority: Low  . History of colonic polyps 08/23/2008    Priority: Low  . Essential tremor 03/31/2018  . Syncope 11/02/2017  . Venous stasis dermatitis of both lower extremities 11/02/2017  . Forearm laceration, right, initial encounter 11/02/2017  . Chronic respiratory failure with hypoxia ([place]) 09/23/2017  . Pleuritic pain 07/17/2017  . Leukocytosis 12/19/2016    Medications- reviewed and updated Current Outpatient Medications  Medication Sig Dispense Refill  . albuterol (VENTOLIN HFA) 108 (90 Base) MCG/ACT inhaler USE 2 PUFFS EVERY 6 HOURS  AS NEEDED FOR WHEEZING 3 Inhaler 3  . alendronate (FOSAMAX) 70 MG tablet Take 1 tablet (70 mg total) by mouth once a week. Take with a full glass of water on an empty stomach. 12 tablet 3  . azithromycin (ZITHROMAX) 250 MG tablet TAKE 1 TABLET BY MOUTH  ON  MONDAY, WEDNESDAY, AND  FRIDAY 39 tablet 1  . Calcium Carbonate-Vit D-Min (CALCIUM 1200 PO) Take 1,200 mg by mouth daily.    . Cholecalciferol (VITAMIN D3) 1000 units CAPS Take 1,000 Units by mouth daily.    [name] Kitchen doxycycline (VIBRA-TABS) 100 MG tablet Take 1 tablet (100 mg total) by mouth 2 (two) times daily. 20 tablet 0  . fluticasone (FLONASE) 50 MCG/ACT nasal spray USE 2 SPRAYS IN EACH  NOSTRIL DAILY 48 g 1  . Fluticasone-Umeclidin-Vilant (TRELEGY ELLIPTA) 100-62.5-25 MCG/INH AEPB Inhale 1 puff into the lungs daily. (Patient not taking: Reported on 03/31/2018) 1 each 0  . furosemide (LASIX) 20 MG tablet Take 1 tablet (20 mg total) by mouth daily as needed for edema. 30 tablet 0  . HYDROcodone-acetaminophen (NORCO/VICODIN) 5-325 MG tablet Take 1-2 tablets by mouth every 4 (four) hours as needed for moderate pain. 15 tablet 0  . Misc. Devices (PULSE OXIMETER FOR FINGER) MISC 1 Device by Does not apply route as needed. 1 each 0  . OXYGEN Inhale 2 L into the lungs continuous. 2L O2 at rest and 3L O2 with exertion.    . predniSONE (DELTASONE) 10 MG tablet TAKE 1 TABLET BY MOUTH  DAILY WITH BREAKFAST 90 tablet 3  . SPIRIVA RESPIMAT 2.5 MCG/ACT AERS USE 2 SPRAYS (INHALATIONS)  ONCE DAILY 12 g 1  . SYMBICORT 160-4.5 MCG/ACT inhaler USE 2 PUFFS TWO TIMES DAILY 30.6 g 2  . terbinafine (LAMISIL) 250 MG  tablet terbinafine HCl 250 mg tablet     No current facility-administered medications for this visit.     Objective: BP 125/80 (BP Location: Left Arm, Patient Position: Sitting, Cuff Size: Normal)   Pulse 93   Temp 98.1 F (36.7 C) (Oral)   Ht 6' (1.829 m)   Wt 155 lb 6.4 oz (70.5 kg)   SpO2 96%   BMI 21.08 kg/m  Gen: NAD, resting comfortably CV: RRR no murmurs rubs or gallops Lungs: CTAB no crackles, wheeze. Some diffuse rhonchi Abdomen: soft/nontender/nondistended/normal bowel sounds.  Ext: no edema Skin: warm, dry Neuro: intention tremor bilateral hands  Assessment/Plan:  Other  notes: 1. Remains cigarette free since march 2. COPD Some wheezing in last few days and coughing up some sputum with color in it. Remains on chronic prednisone 10mg . Tried 4 day boost of prednisone that pulm advised but not feeling better. 5 days of symptoms- gave doxycycline to have on hand in case fails to improve over next week or if symptoms worsen 3. vitamin D slightly low around 27ng/mL. He is on calcium and vitamin D.  4. Has 6 hydocodone yet- we refilled #15 in case he runs out- for prior compression fracture- hoping he doesn't need this. NCCS[facility] reviewed and only [place] and ortho for pain medication  BPPV (benign paroxysmal positional vertigo), right S: note from PT "Dr. Yong Channel,  Mr. Ding has been seen for 4 visits in physical therapy. He has had persistent R BPPV that did not respond to Epley's maneuver. We have tried liberatory (Semont) maneuver for R posterior canal BPPV, and patient has worked on [employer] in the home. Symptoms seem to be improving, but have not fully resolved.   PT is also working on beginning [employer] for fall prevention, and we have discussed transition to pulmonary rehab at end of our episode of care.  He is scheduled to see you Tuesday 9/24-- wanted to give you an update on his status.  Thank you for the referral of this patient.  Rudell Cobb, MPT "  He missed an appointment yesterday. Has visit on Friday. Has only had 1 mild dizzy/vertigo episode since last PT visit. With position changes, has had some higher blood pressures.  A/P: Resistant case but still seems classic for BPPV- luckily better in last week. We will monitor- I am certainly open to pursue neuroimaging if PT does not think consistent with BPPV   Hyperlipidemia S: patient declined even once a week atorvastatin after last visit A/P: patient is more concerned about long term health from COPD and wants to remain off of statin.   Essential tremor S: tremor in both hands  with activity. None at rest. Hard to hold spoon A/P: discussed weighted utensils trial. Martin Majestic through meds like primidone but places him at risk with his copd as does beta blockers. Discussed benzodiazepines or gabapentin but worried about increasing fall risk   Future Appointments  Date Time Provider [place]  04/03/2018  9:30 AM Mervyn Gay, PT [facility]  04/06/2018  8:45 AM Mervyn Gay, PT [facility]  04/10/2018  9:30 AM Mervyn Gay, PT [facility]  04/13/2018  9:30 AM Mervyn Gay, PT [facility]  04/22/2018 11:45 AM Mervyn Gay, PT [facility]  04/24/2018 10:15 AM Mervyn Gay, PT [facility]  04/27/2018  8:45 AM Mervyn Gay, PT [facility]  04/29/2018  8:45 AM Mervyn Gay, PT [facility]  05/04/2018  9:45 AM Ramaswamy,  Belva Crome, MD [facility] None  05/06/2018  8:45 AM Mervyn Gay, PT [facility]  05/08/2018  8:45 AM Mervyn Gay, PT [facility]   Return in about 6 months (around 09/29/2018) for follow up- or sooner if needed.  Lab/Order associations: Hyperlipidemia, unspecified hyperlipidemia type  Essential tremor  Other emphysema (HCC)  BPPV (benign paroxysmal positional vertigo), right  Need for influenza vaccination - Plan: Flu vaccine HIGH DOSE PF (Fluzone High dose)  Meds ordered this encounter  Medications  . doxycycline (VIBRA-TABS) 100 MG tablet    Sig: Take 1 tablet (100 mg total) by mouth 2 (two) times daily.    Dispense:  20 tablet    Refill:  0  . HYDROcodone-acetaminophen (NORCO/VICODIN) 5-325 MG tablet    Sig: Take 1-2 tablets by mouth every 4 (four) hours as needed for moderate pain.    Dispense:  15 tablet    Refill:  0   Return precautions advised.  Garret Reddish, MD

## 2018-03-31 NOTE — Assessment & Plan Note (Signed)
S: patient declined even once a week atorvastatin after last visit A/P: patient is more concerned about long term health from COPD and wants to remain off of statin.

## 2018-03-31 NOTE — Patient Instructions (Addendum)
Health Maintenance Due  Topic Date Due  . INFLUENZA VACCINE -completed today 02/05/2018   Still cigarette free correct?!?   Sorry I dont have a good medication option for tremor- try weighted spoons

## 2018-03-31 NOTE — Assessment & Plan Note (Signed)
S: tremor in both hands with activity. None at rest. Hard to hold spoon A/P: discussed weighted utensils trial. Francisco [name] through meds like primidone but places him at risk with his copd as does beta blockers. Discussed benzodiazepines or gabapentin but worried about increasing fall risk

## 2018-03-31 NOTE — Patient Instructions (Signed)
Francisco [name] ,  Bring a copy of your living will and/or healthcare power of attorney to your next office visit.  Thank you for taking time to come for your Medicare Wellness Visit. I appreciate your ongoing commitment to your health goals. Please review the following plan we discussed and let me know if I can assist you in the future.   These are the goals we discussed: Goals   None     This is a list of the screening recommended for you and due dates:  Health Maintenance  Topic Date Due  . Flu Shot  02/05/2018  .  Hepatitis C: One time screening is recommended by Center for Disease Control  (CDC) for  adults born from [birth year] through 1963.   07/04/2098*  . Colon Cancer Screening  04/02/2020  . Tetanus Vaccine  11/06/2027  . Pneumonia vaccines  Completed  *Topic was postponed. The date shown is not the original due date.   Preventive Care for Adults  A healthy lifestyle and preventive care can promote health and wellness. Preventive health guidelines for adults include the following key practices.  . A routine yearly physical is a good way to check with your health care provider about your health and preventive screening. It is a chance to share any concerns and updates on your health and to receive a thorough exam.  . Visit your dentist for a routine exam and preventive care every 6 months. Brush your teeth twice a day and floss once a day. Good oral hygiene prevents tooth decay and gum disease.  . The frequency of eye exams is based on your age, health, family medical history, use  of contact lenses, and other factors. Follow your health care provider's recommendations for frequency of eye exams.  . Eat a healthy diet. Foods like vegetables, fruits, whole grains, low-fat dairy products, and lean protein foods contain the nutrients you need without too many calories. Decrease your intake of foods high in solid fats, added sugars, and salt. Eat the right amount of calories for you. Get  information about a proper diet from your health care provider, if necessary.  . Regular physical exercise is one of the most important things you can do for your health. Most adults should get at least 150 minutes of moderate-intensity exercise (any activity that increases your heart rate and causes you to sweat) each week. In addition, most adults need muscle-strengthening exercises on 2 or more days a week.  Silver Sneakers may be a benefit available to you. To determine eligibility, you may visit the website: www.silversneakers.com or contact program at [phone] Mon-Fri between 8AM-8PM.   . Maintain a healthy weight. The body mass index (BMI) is a screening tool to identify possible weight problems. It provides an estimate of body fat based on height and weight. Your health care provider can find your BMI and can help you achieve or maintain a healthy weight.   For adults 20 years and older: ? A BMI below 18.5 is considered underweight. ? A BMI of 18.5 to 24.9 is normal. ? A BMI of 25 to 29.9 is considered overweight. ? A BMI of 30 and above is considered obese.   . Maintain normal blood lipids and cholesterol levels by exercising and minimizing your intake of saturated fat. Eat a balanced diet with plenty of fruit and vegetables. Blood tests for lipids and cholesterol should begin at age 18 and be repeated every 5 years. If your lipid or cholesterol levels are  high, you are over 50, or you are at high risk for heart disease, you may need your cholesterol levels checked more frequently. Ongoing high lipid and cholesterol levels should be treated with medicines if diet and exercise are not working.  . If you smoke, find out from your health care provider how to quit. If you do not use tobacco, please do not start.  . If you choose to drink alcohol, please do not consume more than 2 drinks per day. One drink is considered to be 12 ounces (355 mL) of beer, 5 ounces (148 mL) of wine, or 1.5  ounces (44 mL) of liquor.  . If you are 21-33 years old, ask your health care provider if you should take aspirin to prevent strokes.  . Use sunscreen. Apply sunscreen liberally and repeatedly throughout the day. You should seek shade when your shadow is shorter than you. Protect yourself by wearing long sleeves, pants, a wide-brimmed hat, and sunglasses year round, whenever you are outdoors.  . Once a month, do a whole body skin exam, using a mirror to look at the skin on your back. Tell your health care provider of new moles, moles that have irregular borders, moles that are larger than a pencil eraser, or moles that have changed in shape or color.

## 2018-04-02 ENCOUNTER — Telehealth: Payer: Self-pay | Admitting: Internal Medicine

## 2018-04-02 NOTE — Telephone Encounter (Signed)
Called and spoke to pt.  Pt states he removed oxygen this morning to shave. He put oxygen back on and walked to the living room O2 levels were 77% on 3L. Pt stated he was experiencing increased osb during this time.  Oxygen level recovered to 97% within 1-58min on rest with 3L. He also stated this afternoon he went from the kitchen to the living room on 3L and O2 dropped to 87%. Level recovered within one minute to high 90's on 3L.  Pt measured oxygen during our conversation on 3L- O2 97% HR 100. Advised pt to look for Kinks in hose. Pt stated there not not any kinks. Pt was seen by PCP on 04/01/18, and was prescribed doxy for prod cough with yellow mucus & nasal congestion. Ordered apt, pt is requesting recommendations prior.  Pt also stated he has PT tomorrow and is concerned about going, due to sats.with exertion.   MR please advise.

## 2018-04-02 NOTE — Telephone Encounter (Signed)
If he is not responding to prednisone/doxy then we need to understand what the issue is. So he needs to go to ER or come in 04/02/2018 or 04/03/18 instead of going to rehab 04/03/18

## 2018-04-02 NOTE — Telephone Encounter (Signed)
Pt is aware of below recommendations and voiced his understanding.  Pt has been scheduled for acute visit on 04/03/18 with MR. Advised pt to go to ED if symptoms worsen. Nothing further is needed.

## 2018-04-03 ENCOUNTER — Ambulatory Visit: Payer: Medicare Other | Admitting: Rehabilitative and Restorative Service Providers"

## 2018-04-03 ENCOUNTER — Ambulatory Visit (INDEPENDENT_AMBULATORY_CARE_PROVIDER_SITE_OTHER): Payer: Medicare Other | Admitting: Internal Medicine

## 2018-04-03 ENCOUNTER — Other Ambulatory Visit (INDEPENDENT_AMBULATORY_CARE_PROVIDER_SITE_OTHER): Payer: Medicare Other

## 2018-04-03 ENCOUNTER — Ambulatory Visit (INDEPENDENT_AMBULATORY_CARE_PROVIDER_SITE_OTHER)
Admission: RE | Admit: 2018-04-03 | Discharge: 2018-04-03 | Disposition: A | Discharge status: Home / Self Care | Payer: Medicare Other | Source: Ambulatory Visit | Attending: Internal Medicine | Admitting: Internal Medicine

## 2018-04-03 ENCOUNTER — Encounter: Payer: Self-pay | Admitting: Internal Medicine

## 2018-04-03 ENCOUNTER — Telehealth: Payer: Self-pay | Admitting: Internal Medicine

## 2018-04-03 VITALS — BP 124/76 | HR 98 | Ht 70.0 in | Wt 154.8 lb

## 2018-04-03 DIAGNOSIS — J441 Chronic obstructive pulmonary disease with (acute) exacerbation: Secondary | ICD-10-CM

## 2018-04-03 DIAGNOSIS — J9611 Chronic respiratory failure with hypoxia: Secondary | ICD-10-CM

## 2018-04-03 DIAGNOSIS — J449 Chronic obstructive pulmonary disease, unspecified: Secondary | ICD-10-CM

## 2018-04-03 DIAGNOSIS — R0602 Shortness of breath: Secondary | ICD-10-CM

## 2018-04-03 LAB — CBC WITH DIFFERENTIAL/PLATELET
BASOS ABS: 0.1 10*3/uL (ref 0.0–0.1)
Basophils Relative: 0.7 % (ref 0.0–3.0)
Eosinophils Absolute: 0 10*3/uL (ref 0.0–0.7)
Eosinophils Relative: 0.2 % (ref 0.0–5.0)
HEMATOCRIT: 40.3 % (ref 39.0–52.0)
HEMOGLOBIN: 14 g/dL (ref 13.0–17.0)
LYMPHS PCT: 9.5 % — AB (ref 12.0–46.0)
Lymphs Abs: 0.9 10*3/uL (ref 0.7–4.0)
MCHC: 34.7 g/dL (ref 30.0–36.0)
MCV: 96.2 fl (ref 78.0–100.0)
MONOS PCT: 8.2 % (ref 3.0–12.0)
Monocytes Absolute: 0.8 10*3/uL (ref 0.1–1.0)
NEUTROS ABS: 8.1 10*3/uL — AB (ref 1.4–7.7)
Neutrophils Relative %: 81.4 % — ABNORMAL HIGH (ref 43.0–77.0)
Platelets: 236 10*3/uL (ref 150.0–400.0)
RBC: 4.18 Mil/uL — ABNORMAL LOW (ref 4.22–5.81)
RDW: 13.1 % (ref 11.5–15.5)
WBC: 9.9 10*3/uL (ref 4.0–10.5)

## 2018-04-03 LAB — BASIC METABOLIC PANEL
BUN: 12 mg/dL (ref 6–23)
CALCIUM: 9.9 mg/dL (ref 8.4–10.5)
CHLORIDE: 97 meq/L (ref 96–112)
CO2: 32 meq/L (ref 19–32)
Creatinine, Ser: 0.93 mg/dL (ref 0.40–1.50)
GFR: 85.24 mL/min (ref >60.00)
Glucose, Bld: 107 mg/dL — ABNORMAL HIGH (ref 70–99)
Potassium: 4.6 mEq/L (ref 3.5–5.1)
SODIUM: 138 meq/L (ref 135–145)

## 2018-04-03 LAB — BRAIN NATRIURETIC PEPTIDE: PRO B NATRI PEPTIDE: 50 pg/mL (ref 0.0–100.0)

## 2018-04-03 IMAGING — DX DG CHEST 2V
2 series · 2 of 2 positions shown · non-contrast
Comparison: [DATE]

CLINICAL DATA: Increased cough, congestion, sob since yesterday,
x-smoker, hx of emphysema/copd.

EXAM:
CHEST - 2 VIEW

[chest pa]
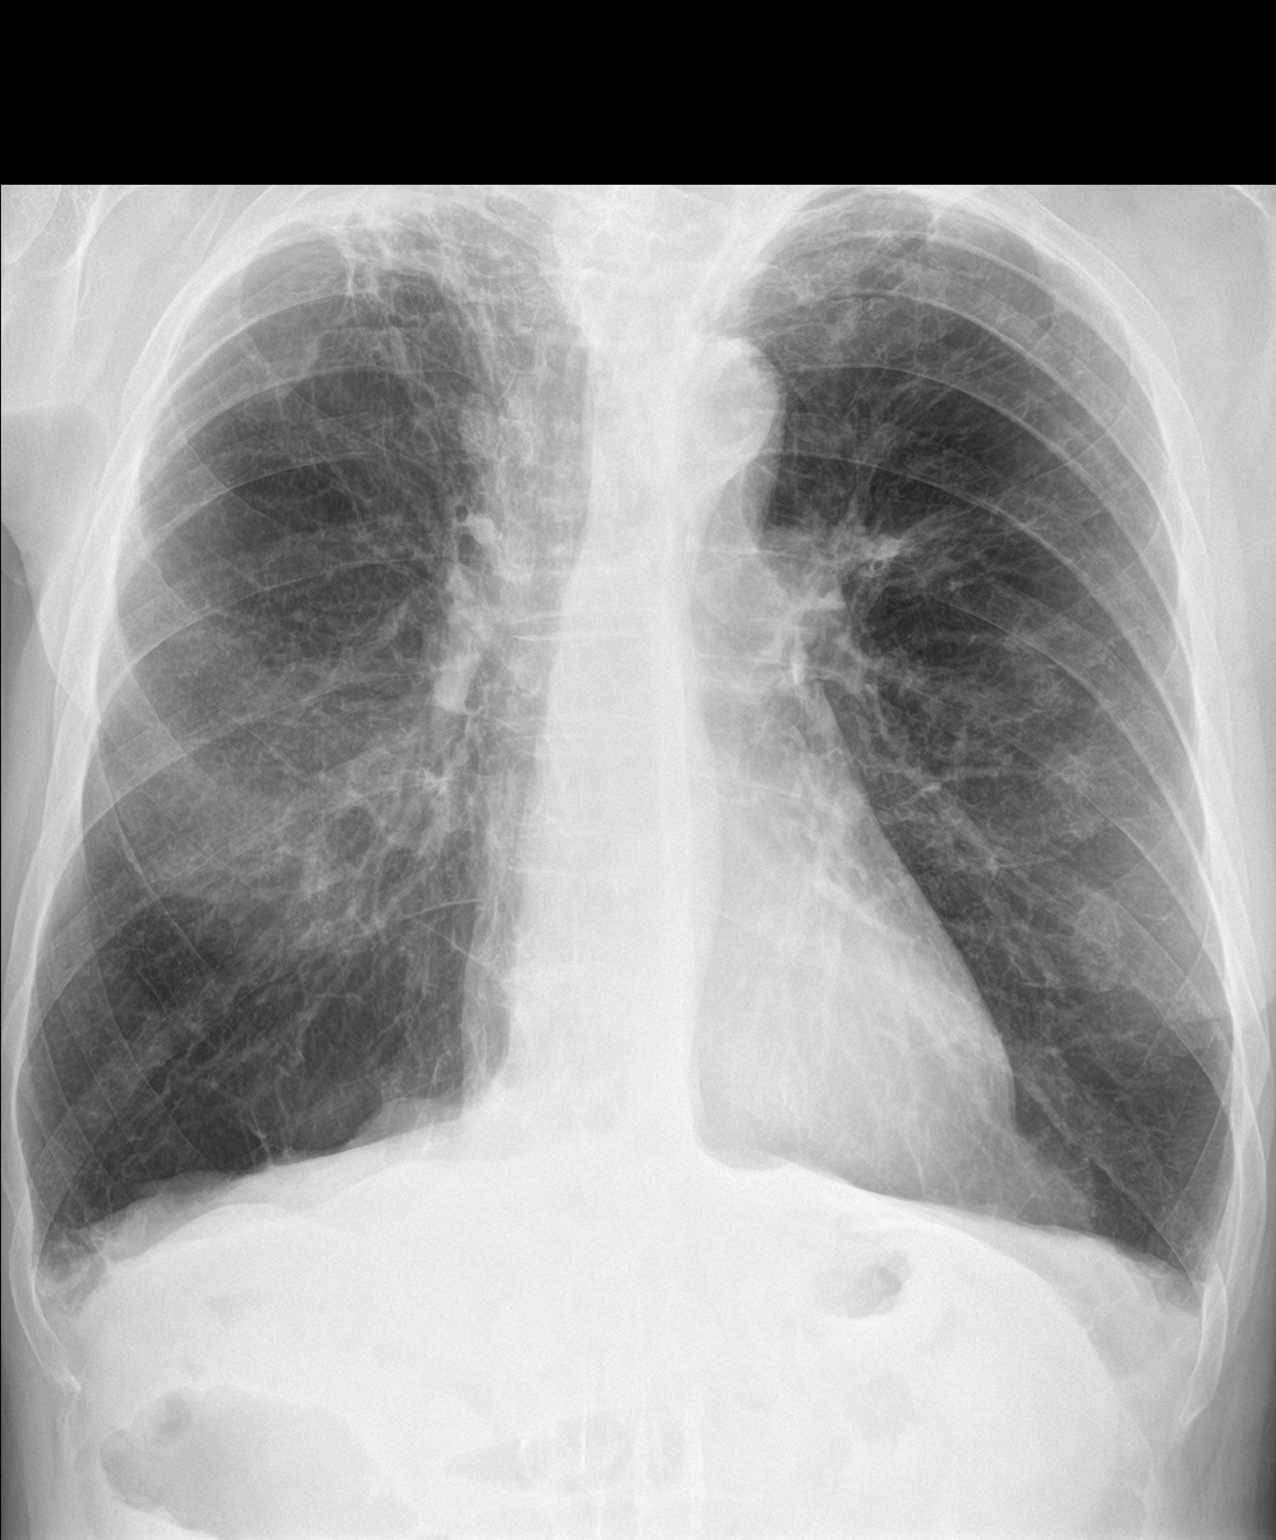

[chest lat]
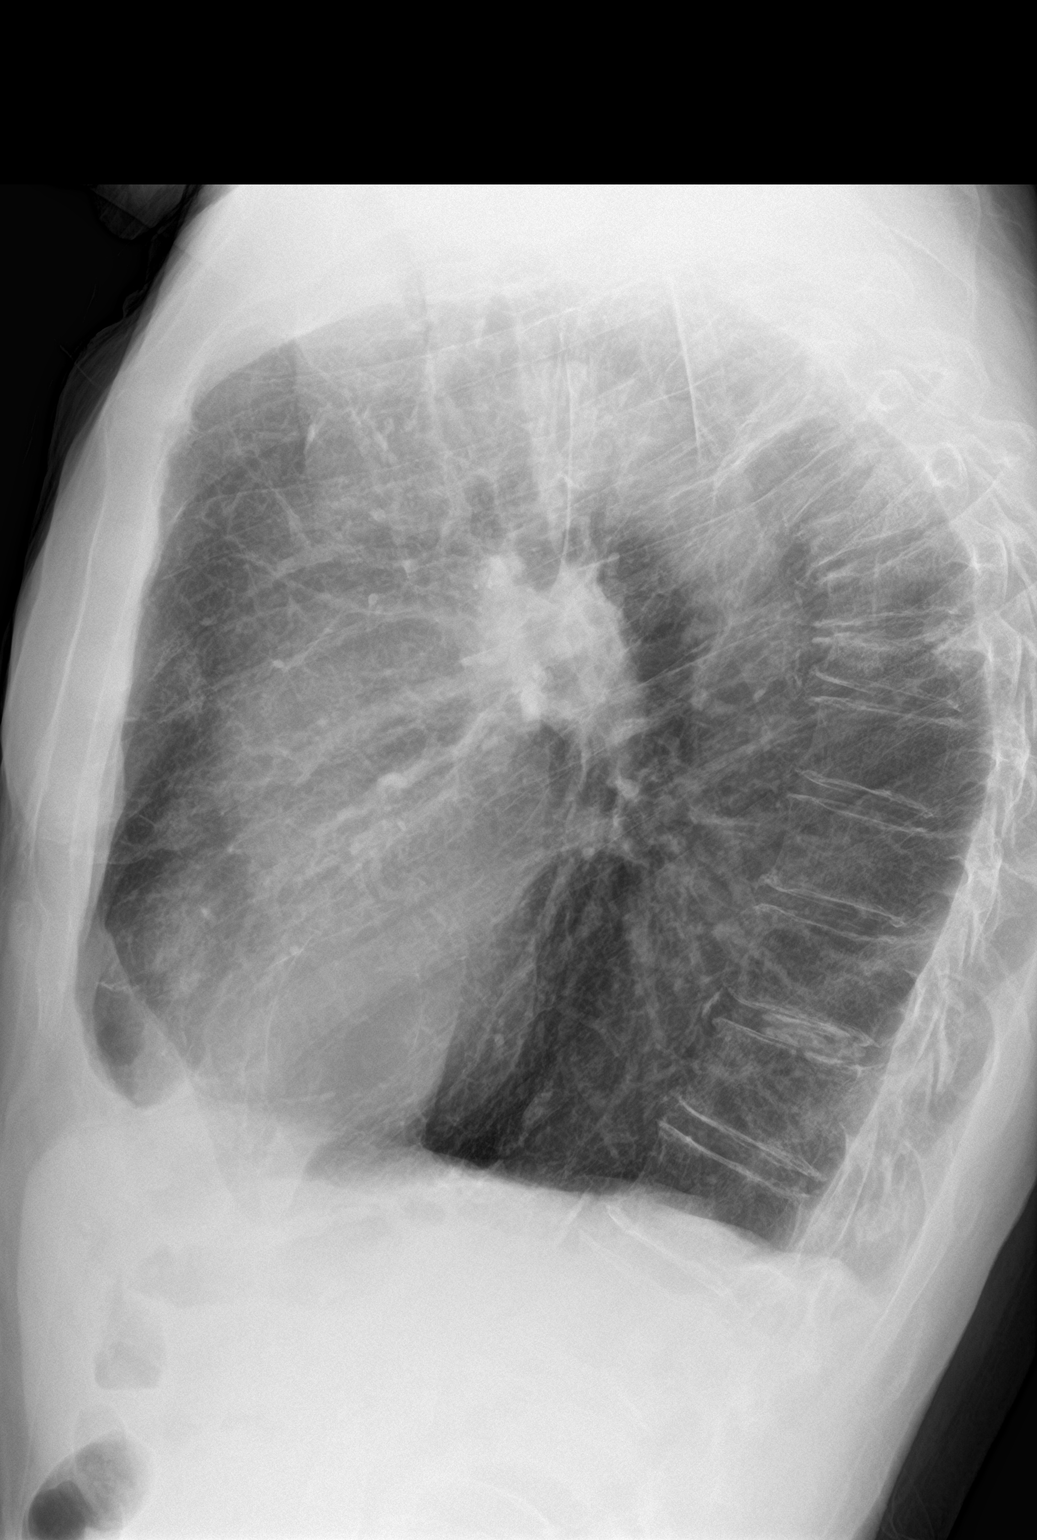

[2 of 2 positions shown; findings below may reference images not displayed]

FINDINGS: Right greater than left apical pleuroparenchymal scarring not
appreciably changed from [FC].

Severe emphysema. Airway thickening is present, suggesting
bronchitis or reactive airways disease.

Atherosclerotic calcification of the aortic arch. Mild interstitial
accentuation appears worsened compared to [DATE]. No pleural
effusion.

Progressive compression fracture at T6, worsened compared to [DATE], previously with 30% loss of height and currently with 60%
loss of height. Stable compressions at T7 and T8.
IMPRESSION: 1. Mild increase interstitial accentuation compared to the [DATE] exam, suggesting mild noncardiogenic edema or mild atypical
pneumonia. This is superimposed on a background of severe emphysema.
2. Airway thickening is present, suggesting bronchitis or reactive
airways disease.
3. Progressive compression of the T6 vertebra, previously with 30%
loss of height in [DATE], now with 60% loss of height. Stable
compression fractures at T7 and T8. These compressions are likely
osteoporotic.
4. Chronic right greater than left apical pleuroparenchymal
scarring, felt to be benign given the long-term stability.

## 2018-04-03 MED ORDER — PREDNISONE 10 MG PO TABS: ORAL_TABLET | ORAL | 0 refills | Status: DC

## 2018-04-03 MED ORDER — DOXYCYCLINE HYCLATE 100 MG PO TABS: 100.0000 mg | ORAL_TABLET | Freq: Two times a day (BID) | ORAL | 0 refills | Status: DC

## 2018-04-03 NOTE — Progress Notes (Signed)
ROV 12/30/16 -- patient has a history of tobacco use, COPD currently maintained on chronic prednisone, Symbicort, Spiriva. Most recent blurry function testing was March 2011 with an FEV1 of 1.2 L (34% Pred), severe obstruction. He up titrates his prednisone on his own based on symptoms. No abx since last time. He has increased pred temporrily about 4 -5 x since last time. No increases for over a month. He is smoking a pipe, not cigarettes. He is using HCTZ more frequently these days. He is on flonase and singulair. He does note that he has some slow progression of his DOE. He coughs a few times a day, clears clear sputum.   rov 07/17/17 --this is a follow-up [name] for patient with active tobacco use, severe COPD and chronic prednisone use.  He often titrates his prednisone on his own depending on how he is feeling. He complains of a new pain in his back below his left shoulder blade, has been present for the last 10 days. sometimes pleuritic, can be stabbing, worse w cough. His pred use: on 10mg  for the last 19 days. No hx VTE. He has LE edema, venous stasis changes, increased since his diuretics decreased.   ROV 09/23/17 --72 year old man with a history of tobacco use (still smokes pipe), severe COPD and severe obstruction on spirometry.  At his last [name] he had some pleuritic left back pain and I performed a CT PA that I have reviewed from 07/17/17.  There was no pulmonary embolism but extensive emphysema present, some bilateral apical scar more so on the right, stable.  He uses prednisone 10 mg daily and uptitrate depending on his day-to-day symptoms.  He is otherwise managed on Symbicort and Spiriva.  Desaturated on arrival today after ambulating to the exam room. He reports more dyspnea, has increased pred intermittently.    OV 12/05/2017  Chief Complaint  Patient presents with  . Follow-up    Switching from RB to MR.   Pt is on 3L with exertion and 2L at rest. Pt states his breathing had  become worse but since he has been on O2 since 3/21, it has really helped with his breathing.  Pt was in hospital 4/28-5/3. Other than SOB, pt does have c/o cough, rattling in chest. Denies any CP/chest tightness.   History from patient, his wife and review of the old chart  Francisco [name] a transfer of care from Francisco [name] to myself Francisco [name].  His son is my patient and therefore he is done this transfer of care.  According to the patient he is to be seen by Francisco [name] for COPD.  Patient is FEV1 34% based on March 2011 PFTs according to chart review.  At baseline he is maintained on Spiriva, Symbicort, Singulair, chronic daily prednisone 10 mg/day for at least 3 years, and 3 times a week of azithromycin for 2 years.  Earlier this year in March 2019 he went on daytime oxygen and following a recent hospitalization April-May 2019.  For syncope that was believed due to nocturnal hypoxemia he was started on night oxygen.  Since starting oxygen his edema has improved and his overall quality of life is improved and he stopped having any orthostasis or presyncopal episodes.  He is grateful for the fact that he is on oxygen.  His current COPD CAT score and severity of symptoms is rated below and is 26.  Show significant amount of symptom burden.  His main goal is  to improve his quality of life.  He had his wife have many questions centering around quality of life, medication therapy.  Of note he has had some thoracic vertebral fractures that are new.  This happened after the fall in late April 2019.  Discovered at admission last month.  He feels is due to prednisone.  He is wondering about portable oxygen.   OV 01/29/2018  Chief Complaint  Patient presents with  . Follow-up    Pt states he has been doing okay since last [name]. States breathing is about the same, has an occ cough but states the O2 has helped out a lot with breathing and cough. Denies any complaints of CP.   Francisco [name] , 72 y.o. ,  with dob [birth year] and male ,Not Hispanic or Latino from [place] [id number] - presents to lung clinic for advanced COPD follow-up. Since his last [name] his symptoms course of improvement. Score is 26 and severe symptoms of document below. He is on Spiriva, Symbicort, continues oxygen, daily prednisone and azithromycin 3 times a week. He has stopped his singulair without any problems. Overall he says he is better. He is willing to give TRELEGY inhaler trial. Last [name] to check blood gas and he does not have hypercapnia so he does not qualify for BiPAP        OV 04/03/2018  Subjective:  Patient ID: Francisco [name], male , DOB: June 07, 1946 , age 72 y.o. , MRN: [id number] , ADDRESS: [place] [id number]   04/03/2018 -   Chief Complaint  Patient presents with  . Acute [name]    Pt's O2 sats have been dropping into the 70s with little exertion.  Pt took O2 off just to shave 9/26 and after he finished shaving put the O2 back on, walked from the bathroom down to the living room on O2 and sats were at 77% on 3L 9/26. Pt also has c/o cough with white to yellow mucus and chest tightness with the SOB.     HPI Francisco [name] 72 y.o. -acute [name] for this patient.  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  He is chronically prednisone dependent.  He also is on schedule azithromycin.  He called in yesterday feeling unwell therefore we asked him to come in today.  He tells me that approximately a week ago he started having increased cough and congestion.  Yesterday primary care physician following a routine [name] thought he was in COPD exacerbation and started him on doxycycline.  He also personally bumped up his baseline prednisone.  He does me that yesterday while he was shaving on room air he felt more short of breath than usual.  Also when he walked he desaturated more than usual therefore he decided to call in and come in today.   There is no fever or chills or hemoptysis or colored sputum.  No leg edema.  No orthopnea.  He does not feel like he needs to be in the emergency department or get admitted.     CAT COPD Symptom & Quality of Life Score (GSK trademark) 0 is no burden. 5 is highest burden 12/05/2017  01/29/2018   Never Cough -> Cough all the time 3 1  No phlegm in chest -> Chest is full of phlegm 2 2  No chest tightness -> Chest feels very tight 1 0  No dyspnea for 1 flight stairs/hill ->  Very dyspneic for 1 flight of stairs 5 4  No limitations for ADL at home -> Very limited with ADL at home 5 5  Confident leaving home -> Not at all confident leaving home 3 3  Sleep soundly -> Do not sleep soundly because of lung condition 3 1  Lots of Energy -> No energy at all 4 4  TOTAL Score (max 40)  26 20    ROS - per HPI     has a past medical history of Chest x-ray abnormality, Chronic rhinitis, COPD (chronic obstructive pulmonary disease) ([place]), and Onychomycosis.   reports that he has quit smoking. His smoking use included pipe and cigarettes. He started smoking about 5 months ago. He has a 20.00 pack-year smoking history. He has never used smokeless tobacco.  Past Surgical History:  Procedure Laterality Date  . APPENDECTOMY    . TONSILLECTOMY      Allergies  Allergen Reactions  . Tape Other (See Comments)    SKIN IS VERY THIN; CAN ONLY USE COBAN WRAPS DUE TO CONDITION OF SKIN!!    Immunization History  Administered Date(s) Administered  . Influenza Split 04/24/2012  . Influenza Whole 05/08/2009, 04/08/2011  . Influenza, High Dose Seasonal PF 05/01/2017, 03/31/2018  . Influenza,inj,Quad PF,6+ Mos 04/27/2013, 04/15/2014, 04/24/2015  . Influenza-Unspecified 05/13/2016  . Pneumococcal Conjugate-13 02/25/2014  . Pneumococcal Polysaccharide-23 08/15/2011, 12/19/2016  . Pneumococcal-Unspecified 03/08/2017  . Td 02/21/2010  . Tdap 11/05/2017  . Zoster 01/29/2012    Family History  Problem  Relation Age of Onset  . Heart disease Mother        CABG in her 31s, nonsmoker  . Cancer Mother   . Stroke Father   . Heart disease Father        Died of MI at age 34, smoker  . Hepatitis Sister   . Coronary artery disease Other        male 1st degree relative <60     Current Outpatient Medications:  .  albuterol (VENTOLIN HFA) 108 (90 Base) MCG/ACT inhaler, USE 2 PUFFS EVERY 6 HOURS  AS NEEDED FOR WHEEZING, Disp: 3 Inhaler, Rfl: 3 .  alendronate (FOSAMAX) 70 MG tablet, Take 1 tablet (70 mg total) by mouth once a week. Take with a full glass of water on an empty stomach., Disp: 12 tablet, Rfl: 3 .  azithromycin (ZITHROMAX) 250 MG tablet, TAKE 1 TABLET BY MOUTH ON  MONDAY, WEDNESDAY, AND  FRIDAY, Disp: 39 tablet, Rfl: 1 .  Calcium Carbonate-Vit D-Min (CALCIUM 1200 PO), Take 1,200 mg by mouth daily., Disp: , Rfl:  .  doxycycline (VIBRA-TABS) 100 MG tablet, Take 1 tablet (100 mg total) by mouth 2 (two) times daily., Disp: 20 tablet, Rfl: 0 .  fluticasone (FLONASE) 50 MCG/ACT nasal spray, USE 2 SPRAYS IN EACH  NOSTRIL DAILY, Disp: 48 g, Rfl: 1 .  HYDROcodone-acetaminophen (NORCO/VICODIN) 5-325 MG tablet, Take 1-2 tablets by mouth every 4 (four) hours as needed for moderate pain., Disp: 15 tablet, Rfl: 0 .  Misc. Devices (PULSE OXIMETER FOR FINGER) MISC, 1 Device by Does not apply route as needed., Disp: 1 each, Rfl: 0 .  OXYGEN, Inhale 2 L into the lungs continuous. 2L O2 at rest and 3L O2 with exertion., Disp: , Rfl:  .  predniSONE (DELTASONE) 10 MG tablet, TAKE 1 TABLET BY MOUTH  DAILY WITH BREAKFAST, Disp: 90 tablet, Rfl: 3 .  SPIRIVA RESPIMAT 2.5 MCG/ACT AERS, USE 2 SPRAYS (INHALATIONS)  ONCE DAILY, Disp: 12 g, Rfl: 1 .  SYMBICORT 160-4.5 MCG/ACT inhaler, USE 2 PUFFS TWO TIMES DAILY, Disp: 30.6 g, Rfl: 2 .  terbinafine (LAMISIL) 250 MG tablet, terbinafine HCl 250 mg tablet, Disp: , Rfl:  .  Fluticasone-Umeclidin-Vilant (TRELEGY ELLIPTA) 100-62.5-25 MCG/INH AEPB, Inhale 1 puff into the  lungs daily. (Patient not taking: Reported on 03/31/2018), Disp: 1 each, Rfl: 0 .  furosemide (LASIX) 20 MG tablet, Take 1 tablet (20 mg total) by mouth daily as needed for edema. (Patient not taking: Reported on 04/03/2018), Disp: 30 tablet, Rfl: 0      Objective:   Vitals:   04/03/18 1027  BP: 124/76  Pulse: 98  SpO2: 95%  Weight: 154 lb 12.8 oz (70.2 kg)  Height: 5\' 10"  (1.778 m)    Estimated body mass index is 22.21 kg/m as calculated from the following:   Height as of this encounter: 5\' 10"  (1.778 m).   Weight as of this encounter: 154 lb 12.8 oz (70.2 kg).  @WEIGHTCHANGE @  [employer]   04/03/18 1027  Weight: 154 lb 12.8 oz (70.2 kg)     Physical Exam  General Appearance:    Alert, cooperative, no distress, appears stated age - older , Deconditioned looking - yes , OBESE  - no, Sitting on Wheelchair -  no  Head:    Normocephalic, without obvious abnormality, atraumatic  Eyes:    PERRL, conjunctiva/corneas clear,  Ears:    Normal TM's and external ear canals, both ears  Nose:   Nares normal, septum midline, mucosa normal, no drainage    or sinus tenderness. OXYGEN ON  - YES . Patient is @ 2L Pulse   Throat:   Lips, mucosa, and tongue normal; teeth and gums normal. Cyanosis on lips - mild baseline _  Neck:   Supple, symmetrical, trachea midline, no adenopathy;    thyroid:  no enlargement/tenderness/nodules; no carotid   bruit or JVD  Back:     Symmetric, no curvature, ROM normal, no CVA tenderness  Lungs:     Distress - no , Wheeze mild UL anteriorly, Barrell Chest - yes, Purse lip breathing - yes, Crackles - no   Chest Wall:    No tenderness or deformity.    Heart:    Regular rate and rhythm, S1 and S2 normal, no rub   or gallop, Murmur - no  Breast Exam:    NOT DONE  Abdomen:     Soft, non-tender, bowel sounds active all four quadrants,    no masses, no organomegaly. Visceral obesity - no  Genitalia:   NOT DONE  Rectal:   NOT DONE  Extremities:   Extremities -  normal, Has Cane - no, Clubbing - no, Edema - mild baseline varcisoities  Pulses:   2+ and symmetric all extremities  Skin:   Stigmata of Connective Tissue Disease - no. DRY And bRUISES  Lymph nodes:   Cervical, supraclavicular, and axillary nodes normal  Psychiatric:  Neurologic:   Pleasant - yes, Anxious - no, Flat affect - no  CAm-ICU - neg, Alert and Oriented x 3 - yes, Moves all 4s - yes, Speech - normal, Cognition - intact           Assessment:       ICD-10-CM   1. COPD with acute exacerbation ([place]) J44.1   2. Chronic respiratory failure with hypoxia ([facility]) J96.11   3. Stage 3 severe COPD by GOLD classification ([place]) J44.9        Plan:     Patient Instructions  ICD-10-CM   1. COPD with acute exacerbation ([place]) J44.1   2. Chronic respiratory failure with hypoxia (HCC) J96.11   3. Stage 3 severe COPD by GOLD classification ([place]) J44.9     Please take Take prednisone 50 mg once daily x3 days,  40mg  once daily x 3 days, then 30mg  once daily x 3 days, then 20mg  once daily x 3 days, then prednisone 10mg  once daily to continue at baseline dose  Take doxycycline 100mg  po twice daily x 5 days; take after meals and avoid sunlight  Chest x-ray two-view today April 03, 2018  CBC with differential, bMET and BNP blood work 04/03/2018   Continue baseline medications as before  Followup Routine follow-up in the next 4-12 weeks  -Please pay attention to any previously scheduled appointment and adjusted accordingly  -Okay to see nurse practitioner follow-up     SIGNATURE    Dr. Brand Males, M.D., F.C.C.P,  Pulmonary and Critical Care Medicine Staff Physician, [place] Director - Interstitial Lung Disease  Program  [facility] [place], [place], [address]  Pager: [phone], If no answer or between  15:00h - 7:00h: call [phone] Telephone: [phone]  11:23 AM 04/03/2018

## 2018-04-03 NOTE — Telephone Encounter (Signed)
  Let him know blood labs stable/nomrmal. CXR with poissible atypical pneumonia v fluid. If not responding to antibipotics go to ER or call next week/come next week - might have to diurese   LABS    PULMONARY No results for input(s): PHART, PCO2ART, PO2ART, HCO3, TCO2, O2SAT in the last 168 hours.  Invalid input(s): PCO2, PO2  CBC Recent Labs  Lab 04/03/18 1148  HGB 14.0  HCT 40.3  WBC 9.9  PLT 236.0    COAGULATION No results for input(s): INR in the last 168 hours.  CARDIAC  No results for input(s): TROPONINI in the last 168 hours. Recent Labs  Lab 04/03/18 1148  PROBNP 50.0     CHEMISTRY Recent Labs  Lab 04/03/18 1148  NA 138  K 4.6  CL 97  CO2 32  GLUCOSE 107*  BUN 12  CREATININE 0.93  CALCIUM 9.9   Estimated Creatinine Clearance: 73.4 mL/min (by C-G formula based on SCr of 0.93 mg/dL).   LIVER No results for input(s): AST, ALT, ALKPHOS, BILITOT, PROT, ALBUMIN, INR in the last 168 hours.   INFECTIOUS No results for input(s): LATICACIDVEN, PROCALCITON in the last 168 hours.   ENDOCRINE CBG (last 3)  No results for input(s): GLUCAP in the last 72 hours.       IMAGING x48h  - image(s) personally visualized  -   highlighted in bold Dg Chest 2 View  Result Date: 04/03/2018 CLINICAL DATA:  Increased cough, congestion, sob since yesterday, x-smoker, hx of emphysema/copd. EXAM: CHEST - 2 VIEW COMPARISON:  11/02/2017 FINDINGS: Right greater than left apical pleuroparenchymal scarring not appreciably changed from 2013. Severe emphysema. Airway thickening is present, suggesting bronchitis or reactive airways disease. Atherosclerotic calcification of the aortic arch. Mild interstitial accentuation appears worsened compared to 11/02/2017. No pleural effusion. Progressive compression fracture at T6, worsened compared to November 02, 2017, previously with 30% loss of height and currently with 60% loss of height. Stable compressions at T7 and T8. IMPRESSION: 1.  Mild increase interstitial accentuation compared to the April 2019 exam, suggesting mild noncardiogenic edema or mild atypical pneumonia. This is superimposed on a background of severe emphysema. 2. Airway thickening is present, suggesting bronchitis or reactive airways disease. 3. Progressive compression of the T6 vertebra, previously with 30% loss of height in April 2019, now with 60% loss of height. Stable compression fractures at T7 and T8. These compressions are likely osteoporotic. 4. Chronic right greater than left apical pleuroparenchymal scarring, felt to be benign given the long-term stability. Electronically Signed   By: Van Clines M.D.   On: 04/03/2018 17:02     No results found for this or any previous visit (from the past 240 hour(s)).

## 2018-04-03 NOTE — Patient Instructions (Addendum)
ICD-10-CM   1. COPD with acute exacerbation ([place]) J44.1   2. Chronic respiratory failure with hypoxia ([facility]) J96.11   3. Stage 3 severe COPD by GOLD classification ([place]) J44.9     Please take Take prednisone 50 mg once daily x3 days,  40mg  once daily x 3 days, then 30mg  once daily x 3 days, then 20mg  once daily x 3 days, then prednisone 10mg  once daily to continue at baseline dose  Take doxycycline 100mg  po twice daily x 5 days; take after meals and avoid sunlight  Chest x-ray two-view today April 03, 2018  CBC with differential, bMET and BNP blood work 04/03/2018   Continue baseline medications as before  Followup Routine follow-up in the next 4-12 weeks  -Please pay attention to any previously scheduled appointment and adjusted accordingly  -Okay to see nurse practitioner follow-up

## 2018-04-06 ENCOUNTER — Encounter: Payer: Self-pay | Admitting: Rehabilitative and Restorative Service Providers"

## 2018-04-06 ENCOUNTER — Other Ambulatory Visit: Payer: Medicare Other

## 2018-04-06 ENCOUNTER — Ambulatory Visit: Payer: Medicare Other | Admitting: Rehabilitative and Restorative Service Providers"

## 2018-04-06 DIAGNOSIS — R0602 Shortness of breath: Secondary | ICD-10-CM

## 2018-04-06 DIAGNOSIS — R2681 Unsteadiness on feet: Secondary | ICD-10-CM

## 2018-04-06 DIAGNOSIS — H8111 Benign paroxysmal vertigo, right ear: Secondary | ICD-10-CM | POA: Diagnosis not present

## 2018-04-06 DIAGNOSIS — R2689 Other abnormalities of gait and mobility: Secondary | ICD-10-CM

## 2018-04-06 DIAGNOSIS — M6281 Muscle weakness (generalized): Secondary | ICD-10-CM

## 2018-04-06 NOTE — Telephone Encounter (Signed)
He should go get a blood d-dimer and if this is abnormal we should get opd CT angio chest (notedL Jan 2019 ruled out for PE via CTA)., Plese order d-dimer

## 2018-04-06 NOTE — Telephone Encounter (Signed)
Called spoke with patient, advised of MR's recommendations to order d-dimer and CT Angio if the d-dimer is abnormal Patient voiced his understanding Order placed for d-dimer STAT  Will route to MR to make him aware

## 2018-04-06 NOTE — Telephone Encounter (Signed)
thanks

## 2018-04-06 NOTE — Telephone Encounter (Signed)
Called and spoke to patient. He states he is still coughing up yellowish mucus and O2 sats are dropping.  He was at therapy this morning and walking from entrance to check-in desk his O2 dropped from 96%-87% while on 3L continuous oxygen. Patient states this is better than before. He has 3-4 days left on his doxycycline. Patient denies chest pain, fever, chills. Patient has appointment at end of October and is aware if symptoms get worse to call back or go to the ER.  MR please advise if need to be seen sooner

## 2018-04-06 NOTE — Therapy (Addendum)
[facility] [address] [place], [place], [address] Phone: [phone]   Fax:  [phone]  Physical Therapy Treatment and Discharge Summary (addended 06/25/18)  Patient Details  Name: Francisco [name] MRN: [id number] Date of Birth: 08-11-1945 Referring Provider (PT): Garret Reddish, DO   Encounter Date: 04/06/2018  PT End of Session - 04/06/18 0928    Visit Number  5    Number of Visits  9    Date for PT Re-Evaluation  04/19/18   due to schedule availability   Authorization Type  [employer] medicare    PT Start Time  0848    PT Stop Time  0920    PT Time Calculation (min)  32 min    Activity Tolerance  Patient tolerated treatment well    Behavior During Therapy  [facility] for tasks assessed/performed       Past Medical History:  Diagnosis Date  . Chest x-ray abnormality   . Chronic rhinitis    -Sinus Ct 08/01/2009 >> Bilateral maxillary sinusitis with some mucosal thickeningin the sphenoid and frontal sinuses as well with air fluid levels present -chronic rhinitis flyer Aug 04, 2009  . COPD (chronic obstructive pulmonary disease) [facility])    PFT's rec Jul 17, 2009  . Onychomycosis    Dr. Judi Cong    Past Surgical History:  Procedure Laterality Date  . APPENDECTOMY    . TONSILLECTOMY      Vitals:   04/06/18 0851 04/06/18 0852  SpO2: (!) 85% 95%    Subjective Assessment - 04/06/18 0844    Subjective  The patient reports he couldn't make therapy last week due to shortness of breath and O2 desaturation.  He reports with minimal exertion, his oxygen is dropping.  His wife dropped him off at the front door today.    Pertinent History  Recent injuries ( T6 and T8 fxs) with syncopal episode in 10/2017.  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    Patient Stated Goals  Get rid of dizziness.     Currently in Pain?  No/denies             Vestibular Assessment - 04/06/18 0851      Vestibular Assessment   General Observation  The patient reports that he has not had dizziness since last session.      Positional Testing   Sidelying Test  Sidelying Right;Sidelying Left      Sidelying Right   Sidelying Right Duration  5 second latency followed by 30 second duration    Sidelying Right Symptoms  Upbeat, right rotatory nystagmus      Sidelying Left   Sidelying Left Duration  10 second latency with low amplitude nystagmus    Sidelying Left Symptoms  Upbeat, left rotatory nystagmus               [facility] Adult PT Treatment/Exercise - 04/06/18 0925      Exercises   Exercises  Other Exercises    Other Exercises   Recommended patient perform seated ankle pumps and long arc quads from prior patient HEP/instreuctions.  Patient does not tolerate gentle supine stretcing for chest musculature due to h/o thoracic fx (discontinued ths exercise).  Added today:  seated shoulder external rotation with red theraband and arms at neutral (see handout).  Performed 10 reps modifying to keep elbows into sides.      Vestibular Treatment/Exercise - 04/06/18 0900      Vestibular Treatment/Exercise   Vestibular Treatment  Provided  Habituation      Nestor Lewandowsky   Number of Reps   4    Symptom Description   2nd rep, 2 second latency/ 8 second duration to right/none to the left on 2nd rep.  3rd rep no latency and 15 second duration right/none to the left; 4th rep no latency and 15 second duration to right/            PT Education - 04/06/18 0928    Education Details  added seated postural strengthening/shoulder strength    Person(s) Educated  Patient;Spouse    Methods  Explanation;Demonstration;Handout    Comprehension  Returned demonstration;Verbalized understanding          PT Long Term Goals - 03/27/18 1014      PT LONG TERM GOAL #1   Title  The patient will have negative positional testing indicating resolution of BPPV.    Time  4    Period  Weeks    Target Date  04/19/18      PT LONG  TERM GOAL #2   Title  The patient will subjectively report no dizziness when moving supine>sit in the morning.    Time  4    Period  Weeks      PT LONG TERM GOAL #3   Title  The patient will return demo [place] for fall prevention due to general decline in mobility.    Time  4    Period  Weeks      PT LONG TERM GOAL #4   Title  The patient will be further assessed on gait speed, Berg, and 5 time sit<>stand and goals to follow (after vertigo treated).    Baseline  Berg=29/56, gait speed=2.06 ft/sec    Time  4    Period  Weeks      PT LONG TERM GOAL #5   Title  The patient will return demo HEP for trunk/core stability due to back pain since fall in 10/2017.      Time  4    Period  Weeks            Plan - 04/06/18 9357    Clinical Impression Statement  The patient has not felt BPPV functionally since last PT visit, however with sidelying test, he has + R sidelying for significant nystagmus that continue to follow pattern for persistent BPPV.  I anticiapte a possibility of mixed canalithiasis and cupulolithiasis due to changing latency periods between repetitions.  He did have positive nystagmus on the left side the first rep that fatigued with repetition.  These nystagmus are harder to differentiate directionally and may be a downbeat and overflow from severe R BPPV.  PT to treat with home habituation.  Cancelled visit for Friday of this week due to desaturation of oxygen and patient not feeling he can withstand general conditioning.    PT Treatment/Interventions  ADLs/Self Care Home Management;Balance training;Neuromuscular re-education;Patient/family education;Gait training;Stair training;Functional mobility training;Canalith Repostioning;Vestibular;Therapeutic activities;Therapeutic exercise;Manual techniques    PT Next Visit Plan  Check habituation exercises, progress to balance/endurance as able.  Begin checking LTGs.     Consulted and Agree with Plan of Care  Patient;Family  member/caregiver    Family Member Consulted  spouse       Patient will benefit from skilled therapeutic intervention in order to improve the following deficits and impairments:  Abnormal gait, Decreased activity tolerance, Decreased balance, Pain, Dizziness, Decreased mobility, Decreased strength, Decreased endurance  Visit Diagnosis: BPPV (benign paroxysmal positional vertigo),  right  Other abnormalities of gait and mobility  Muscle weakness (generalized)  Unsteadiness on feet    PHYSICAL THERAPY DISCHARGE SUMMARY  Visits from Start of Care: 5  Current functional level related to goals / functional outcomes: See goals above   Remaining deficits: Patient cancelled remaining visits due to health issues and being admitted to the hospital.   Education / Equipment: For habituation for vertigo.  Plan: Patient agrees to discharge.  Patient goals were not met. Patient is being discharged due to a change in medical status.  ?????          Thank you for the referral of this patient. Rudell Cobb, MPT   Problem List Patient Active Problem List   Diagnosis Date Noted  . Essential tremor 03/31/2018  . BPPV (benign paroxysmal positional vertigo), right 02/10/2018  . Osteoporosis 11/19/2017  . Syncope 11/02/2017  . Thoracic compression fracture ([place]) 11/02/2017  . Essential hypertension 11/02/2017  . Venous stasis dermatitis of both lower extremities 11/02/2017  . Forearm laceration, right, initial encounter 11/02/2017  . Chronic respiratory failure with hypoxia ([place]) 09/23/2017  . Pleuritic pain 07/17/2017  . BPH associated with nocturia 06/25/2017  . Leukocytosis 12/19/2016  . Hyperglycemia 06/12/2016  . Hyperlipidemia 07/22/2014  . Onychomycosis 07/22/2014  . Left ankle swelling 07/22/2014  . History of skin cancer 05/17/2014  . Chronic rhinitis 10/28/2012  . Pulmonary nodule 09/23/2011  . Former smoker 08/23/2008  . COPD (chronic obstructive pulmonary  disease) with emphysema ([place]) 08/23/2008  . History of colonic polyps 08/23/2008    Hortence Charter, PT 04/06/2018, 9:31 AM  [facility] [address] [place], [place], [address] Phone: [phone]   Fax:  [phone]  Name: Francisco [name] MRN: [id number] Date of Birth: 1945/11/26

## 2018-04-06 NOTE — Patient Instructions (Signed)
EXTERNAL ROTATION: Sitting**in a chair**: Resistance Band    Sit in a chairl, right arm bent to 90, elbow against side (SQUEEZE A TOWEL BETWEEN ELBOW AND RIBS TO KEEP ELBOW GLUED TO YOUR SIDE), hand forward. Against RED resistance band, rotate forearm outward, keeping elbow at side. Rotate forearm outward as far as possible. Complete __1_ sets of _10__ repetitions. Perform _1-2__ sessions per day.  Copyright  VHI. All rights reserved.

## 2018-04-07 ENCOUNTER — Other Ambulatory Visit: Payer: Self-pay

## 2018-04-07 ENCOUNTER — Encounter (HOSPITAL_COMMUNITY): Payer: Self-pay | Admitting: Emergency Medicine

## 2018-04-07 ENCOUNTER — Ambulatory Visit (INDEPENDENT_AMBULATORY_CARE_PROVIDER_SITE_OTHER)
Admission: RE | Admit: 2018-04-07 | Discharge: 2018-04-07 | Disposition: A | Discharge status: Home / Self Care | Payer: Medicare Other | Source: Ambulatory Visit | Attending: Internal Medicine | Admitting: Internal Medicine

## 2018-04-07 ENCOUNTER — Observation Stay (HOSPITAL_COMMUNITY)
Admission: EM | Admit: 2018-04-07 | Discharge: 2018-04-08 | Disposition: A | Discharge status: Home / Self Care | Payer: Medicare Other | Attending: Internal Medicine | Admitting: Internal Medicine

## 2018-04-07 ENCOUNTER — Telehealth: Payer: Self-pay | Admitting: Internal Medicine

## 2018-04-07 DIAGNOSIS — I2699 Other pulmonary embolism without acute cor pulmonale: Secondary | ICD-10-CM | POA: Diagnosis not present

## 2018-04-07 DIAGNOSIS — R7989 Other specified abnormal findings of blood chemistry: Secondary | ICD-10-CM

## 2018-04-07 DIAGNOSIS — Z87891 Personal history of nicotine dependence: Secondary | ICD-10-CM | POA: Insufficient documentation

## 2018-04-07 DIAGNOSIS — M438X4 Other specified deforming dorsopathies, thoracic region: Secondary | ICD-10-CM | POA: Insufficient documentation

## 2018-04-07 DIAGNOSIS — Z7951 Long term (current) use of inhaled steroids: Secondary | ICD-10-CM | POA: Diagnosis not present

## 2018-04-07 DIAGNOSIS — I700 Atherosclerosis of aorta: Secondary | ICD-10-CM | POA: Diagnosis not present

## 2018-04-07 DIAGNOSIS — I2782 Chronic pulmonary embolism: Secondary | ICD-10-CM | POA: Diagnosis present

## 2018-04-07 DIAGNOSIS — F1010 Alcohol abuse, uncomplicated: Secondary | ICD-10-CM | POA: Insufficient documentation

## 2018-04-07 DIAGNOSIS — Z7901 Long term (current) use of anticoagulants: Secondary | ICD-10-CM | POA: Diagnosis not present

## 2018-04-07 DIAGNOSIS — Z9981 Dependence on supplemental oxygen: Secondary | ICD-10-CM | POA: Insufficient documentation

## 2018-04-07 DIAGNOSIS — Z8249 Family history of ischemic heart disease and other diseases of the circulatory system: Secondary | ICD-10-CM | POA: Insufficient documentation

## 2018-04-07 DIAGNOSIS — I951 Orthostatic hypotension: Secondary | ICD-10-CM | POA: Insufficient documentation

## 2018-04-07 DIAGNOSIS — Z66 Do not resuscitate: Secondary | ICD-10-CM | POA: Insufficient documentation

## 2018-04-07 DIAGNOSIS — Z79899 Other long term (current) drug therapy: Secondary | ICD-10-CM | POA: Diagnosis not present

## 2018-04-07 DIAGNOSIS — J9601 Acute respiratory failure with hypoxia: Secondary | ICD-10-CM

## 2018-04-07 DIAGNOSIS — Z888 Allergy status to other drugs, medicaments and biological substances status: Secondary | ICD-10-CM | POA: Diagnosis not present

## 2018-04-07 DIAGNOSIS — J432 Centrilobular emphysema: Secondary | ICD-10-CM | POA: Insufficient documentation

## 2018-04-07 DIAGNOSIS — J439 Emphysema, unspecified: Secondary | ICD-10-CM | POA: Diagnosis present

## 2018-04-07 HISTORY — DX: Unspecified malignant neoplasm of skin, unspecified: C44.90

## 2018-04-07 HISTORY — DX: Orthostatic hypotension: I95.1

## 2018-04-07 HISTORY — DX: Emphysema, unspecified: J43.9

## 2018-04-07 HISTORY — DX: Other pulmonary embolism without acute cor pulmonale: I26.99

## 2018-04-07 HISTORY — DX: Other chronic pain: G89.29

## 2018-04-07 HISTORY — DX: Dizziness and giddiness: R42

## 2018-04-07 HISTORY — DX: Pneumonia, unspecified organism: J18.9

## 2018-04-07 HISTORY — DX: Dorsalgia, unspecified: M54.9

## 2018-04-07 HISTORY — DX: Dependence on supplemental oxygen: Z99.81

## 2018-04-07 HISTORY — DX: Collapsed vertebra, not elsewhere classified, site unspecified, initial encounter for fracture: M48.50XA

## 2018-04-07 LAB — CBC WITH DIFFERENTIAL/PLATELET
ABS IMMATURE GRANULOCYTES: 0.3 10*3/uL — AB (ref 0.0–0.1)
BASOS ABS: 0.1 10*3/uL (ref 0.0–0.1)
BASOS PCT: 1 %
EOS ABS: 0 10*3/uL (ref 0.0–0.7)
EOS PCT: 0 %
HCT: 40.2 % (ref 39.0–52.0)
Hemoglobin: 13.4 g/dL (ref 13.0–17.0)
Immature Granulocytes: 3 %
LYMPHS PCT: 5 %
Lymphs Abs: 0.4 10*3/uL — ABNORMAL LOW (ref 0.7–4.0)
MCH: 32.8 pg (ref 26.0–34.0)
MCHC: 33.3 g/dL (ref 30.0–36.0)
MCV: 98.5 fL (ref 78.0–100.0)
Monocytes Absolute: 0.3 10*3/uL (ref 0.1–1.0)
Monocytes Relative: 3 %
Neutro Abs: 8.3 10*3/uL — ABNORMAL HIGH (ref 1.7–7.7)
Neutrophils Relative %: 88 %
PLATELETS: 200 10*3/uL (ref 150–400)
RBC: 4.08 MIL/uL — AB (ref 4.22–5.81)
RDW: 12.4 % (ref 11.5–15.5)
WBC: 9.3 10*3/uL (ref 4.0–10.5)

## 2018-04-07 LAB — BASIC METABOLIC PANEL
Anion gap: 11 (ref 5–15)
BUN: 11 mg/dL (ref 8–23)
CO2: 27 mmol/L (ref 22–32)
Calcium: 9.6 mg/dL (ref 8.9–10.3)
Chloride: 98 mmol/L (ref 98–111)
Creatinine, Ser: 0.94 mg/dL (ref 0.61–1.24)
GFR calc Af Amer: >60 mL/min (ref >60)
GLUCOSE: 114 mg/dL — AB (ref 70–99)
Potassium: 4.4 mmol/L (ref 3.5–5.1)
Sodium: 136 mmol/L (ref 135–145)

## 2018-04-07 LAB — I-STAT TROPONIN, ED: Troponin i, poc: 0 ng/mL (ref 0.00–0.08)

## 2018-04-07 LAB — D-DIMER, QUANTITATIVE (NOT AT ARMC): D DIMER QUANT: 1.88 ug{FEU}/mL — AB (ref <0.50)

## 2018-04-07 LAB — HEPARIN LEVEL (UNFRACTIONATED): Heparin Unfractionated: 1.24 IU/mL — ABNORMAL HIGH (ref 0.30–0.70)

## 2018-04-07 IMAGING — CT CT ANGIO CHEST
2 of 8 series · 18 of 46 positions shown · IV contrast (ISOVUE 370)
Comparison: Chest x-ray of [DATE] and CT chest of [DATE]

CLINICAL DATA: History of COPD, emphysema, increased shortness of
breath with decreased O2 saturation with exertion, elevated D-dimer

EXAM:
CT ANGIOGRAPHY CHEST WITH CONTRAST
TECHNIQUE: Multidetector CT imaging of the chest was performed using the
standard protocol during bolus administration of intravenous
contrast. Multiplanar CT image reconstructions and MIPs were
obtained to evaluate the vascular anatomy.
CONTRAST:  80mL [CL] IOPAMIDOL ([CL]) INJECTION 76%

[Series 5: thins · axial · 0.70mm/px · z∈[-368,-64]mm · 15 of 342 slices shown]
[im 19/342  lung]
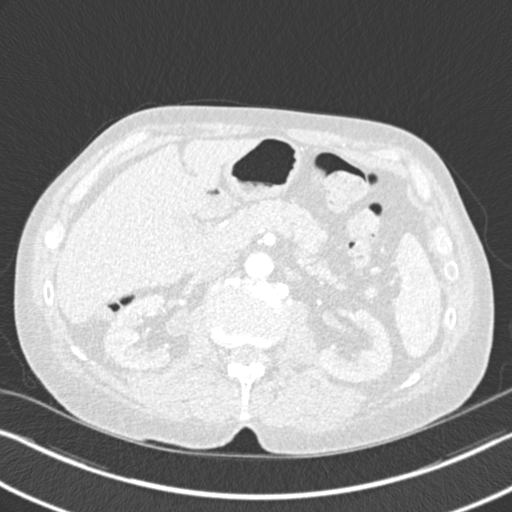
[im 38/342  soft-tissue]
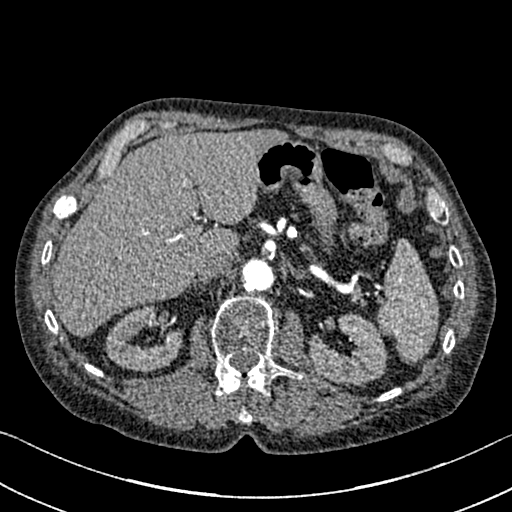
[im 57/342  lung]
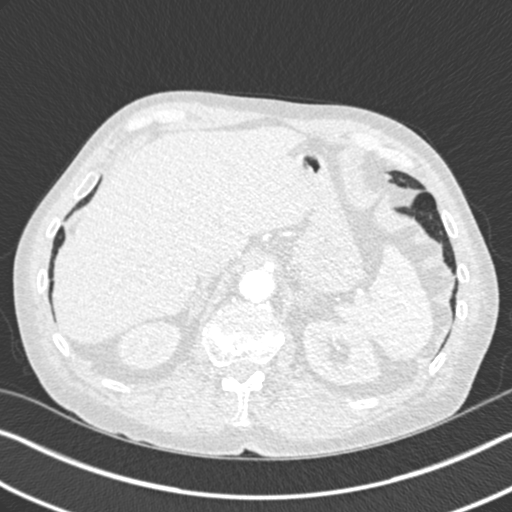
[im 76/342  soft-tissue]
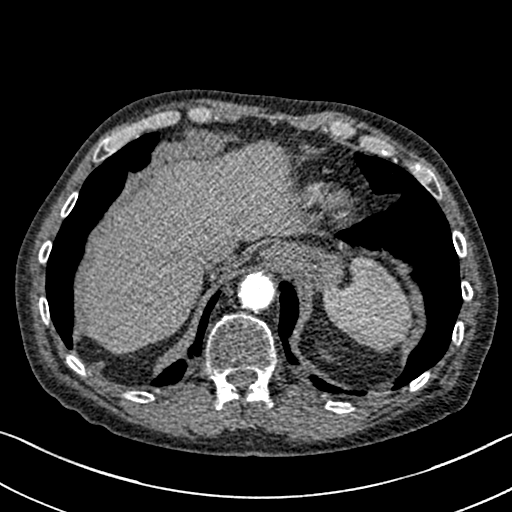
[im 114/342  lung]
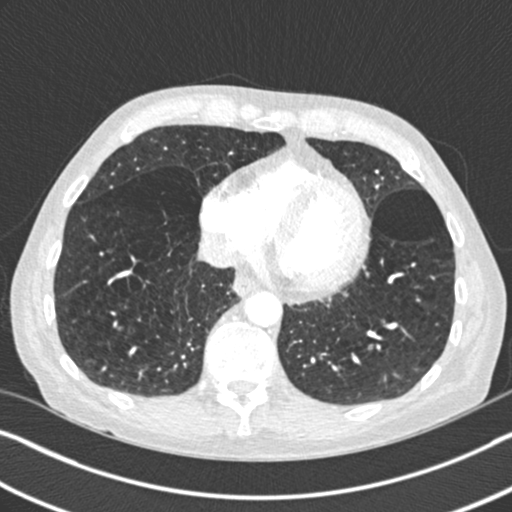
[im 133/342  soft-tissue]
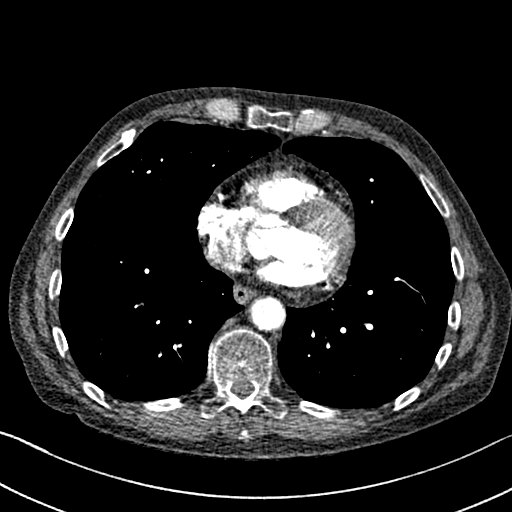
[im 152/342  lung]
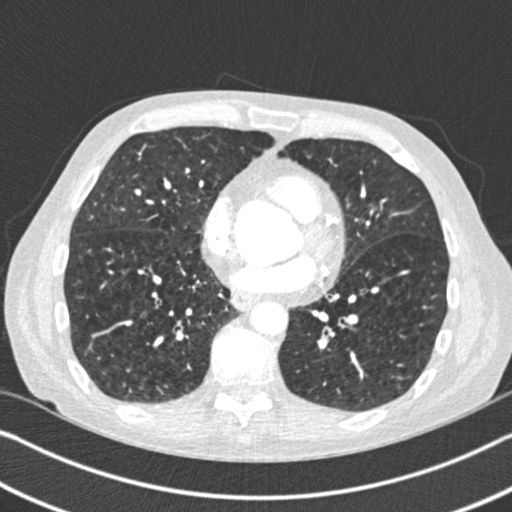
[im 171/342  soft-tissue]
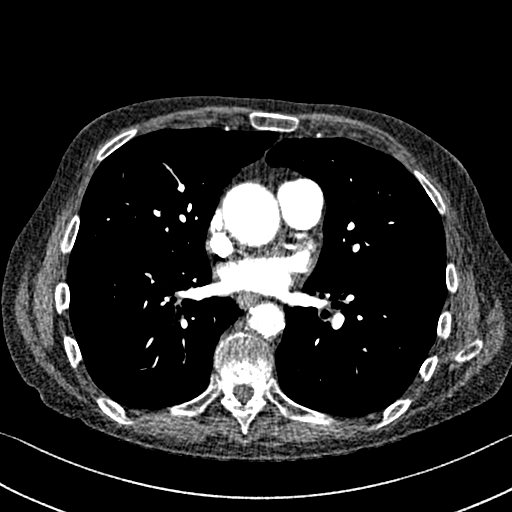
[im 190/342  lung]
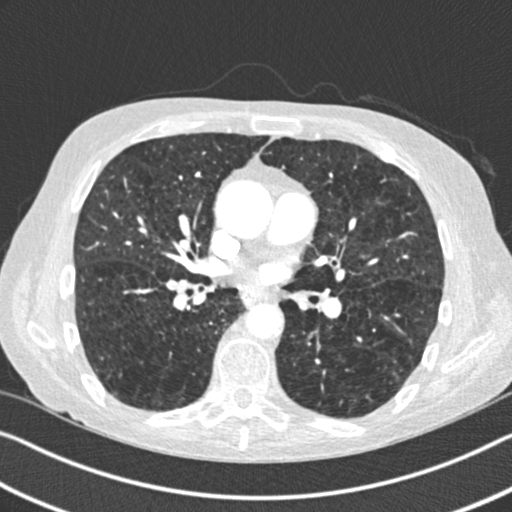
[im 209/342  soft-tissue]
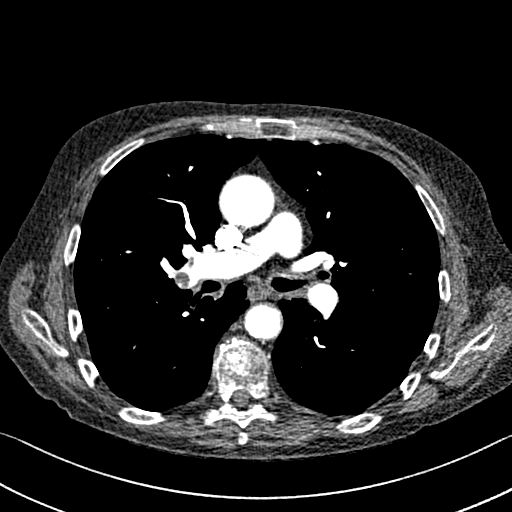
[im 228/342  lung]
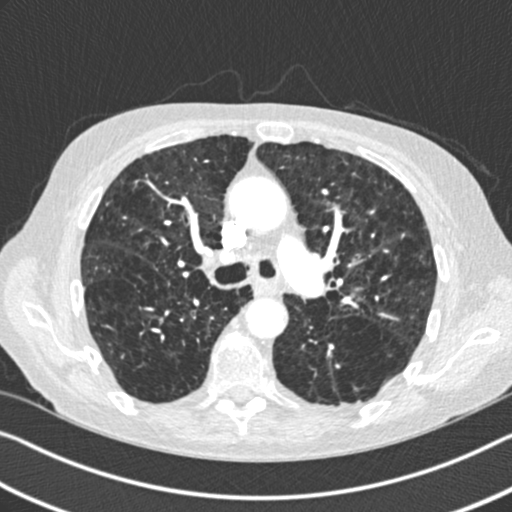
[im 266/342  soft-tissue]
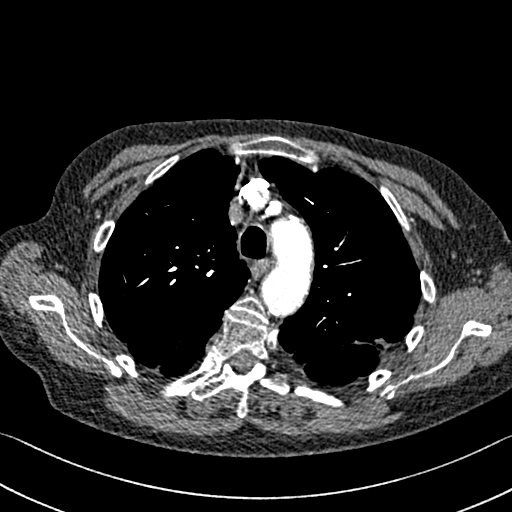
[im 285/342  lung]
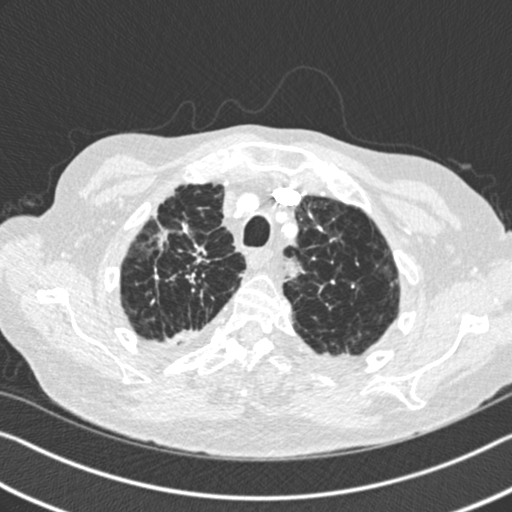
[im 304/342  soft-tissue]
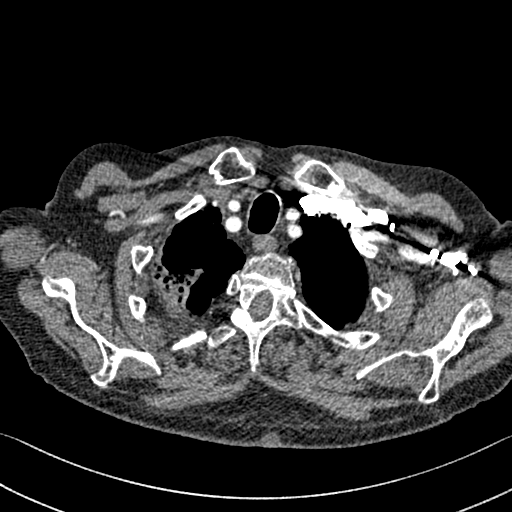
[im 323/342  lung]
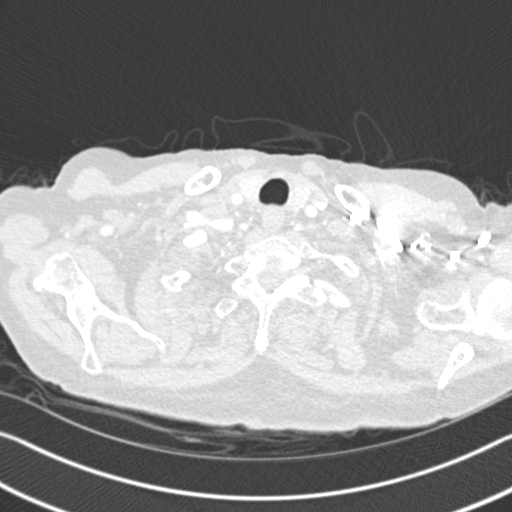

[Series 7: coronal mpr · coronal · 0.67mm/px · 3 of 127 slices shown]
[im 32/127  soft-tissue]
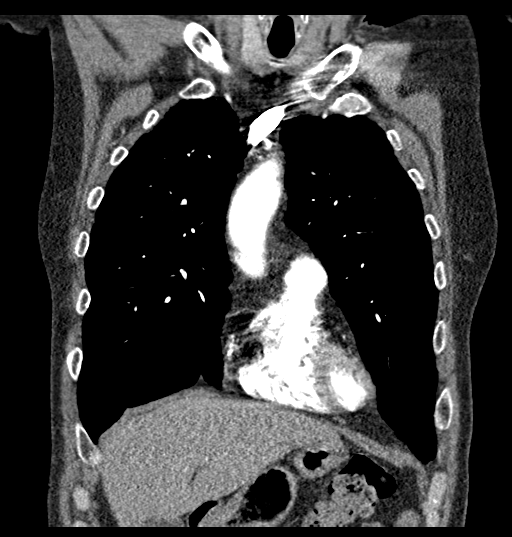
[im 64/127  soft-tissue]
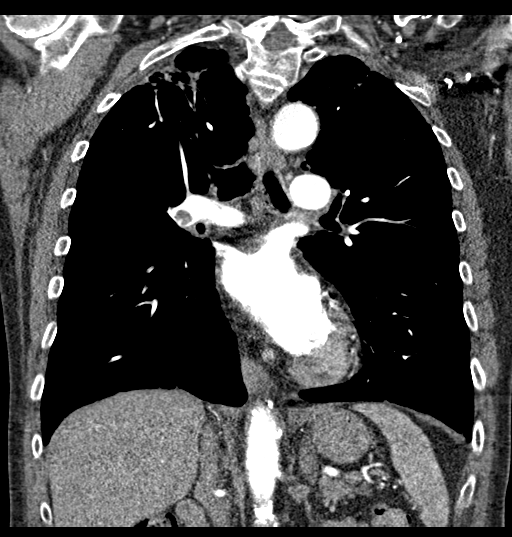
[im 95/127  soft-tissue]
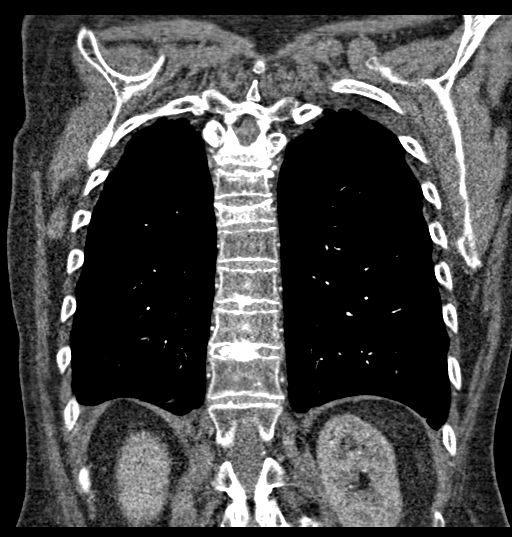

[18 of 46 positions shown; findings below may reference images not displayed]

FINDINGS: Cardiovascular: There is good opacification of the pulmonary
arteries. Pulmonary emboli are noted centrally within branches to
the right lower lobe and right middle lobe. These pulmonary emboli
are not completely occlusive. No definite left pulmonary artery
emboli are seen. There is no evidence of right heart strain. The
heart is within upper limits of normal. The thoracic aorta opacifies
with no acute abnormality. Moderate thoracic aortic atherosclerosis
is present. The mid ascending thoracic aorta measures 38 mm in
diameter.

Mediastinum/Nodes: No mediastinal or hilar adenopathy is seen. The
thyroid gland is unremarkable. There may be a small hiatal hernia
present.

Lungs/Pleura: On lung window images, biapical pleuroparenchymal
scarring is present right worse than left. Diffuse changes of
centrilobular and paraseptal emphysema are noted. No focal
infiltrate is seen and there is no evidence of pleural effusion. No
suspicious lung nodule is evident.

Upper Abdomen: Other than abdominal aortic atherosclerotic change,
no significant abnormality is seen within the upper abdomen.

Musculoskeletal: There was a previous compression deformity of T7,
but there are now partial compression deformities of adjacent T6 and
T8 vertebral bodies. No retropulsion is seen. No lytic or blastic
lesion is noted.

Review of the MIP images confirms the above findings.
IMPRESSION: 1. Nonocclusive pulmonary emboli centrally involving branches to the
right middle lobe and right lower lobe. There is no evidence of
right heart strain. No left pulmonary emboli are seen.
2. New compression deformities of T6 and T8 vertebral bodies with
old compression of T7 vertebral body again noted. No retropulsion.
3. Thoracic and abdominal aortic atherosclerosis.
4. Diffuse changes of centrilobular and paraseptal emphysema.

## 2018-04-07 MED ORDER — IOPAMIDOL (ISOVUE-370) INJECTION 76%
80.0000 mL | Freq: Once | INTRAVENOUS | Status: AC | PRN
  Administered 2018-04-07: 80 mL via INTRAVENOUS

## 2018-04-07 MED ORDER — HYDROCODONE-ACETAMINOPHEN 5-325 MG PO TABS: 1.0000 | ORAL_TABLET | ORAL | Status: DC | PRN

## 2018-04-07 MED ORDER — FLUTICASONE FUROATE-VILANTEROL 200-25 MCG/INH IN AEPB
1.0000 | INHALATION_SPRAY | Freq: Every day | RESPIRATORY_TRACT | Status: DC
  Filled 2018-04-07: qty 28

## 2018-04-07 MED ORDER — DOXYCYCLINE HYCLATE 100 MG PO TABS: 100.0000 mg | ORAL_TABLET | Freq: Two times a day (BID) | ORAL | Status: DC

## 2018-04-07 MED ORDER — HEPARIN (PORCINE) IN NACL 100-0.45 UNIT/ML-% IJ SOLN
1250.0000 [IU]/h | INTRAMUSCULAR | Status: DC
  Administered 2018-04-07: 1250 [IU]/h via INTRAVENOUS
  Filled 2018-04-07 (×2): qty 250

## 2018-04-07 MED ORDER — IBUPROFEN 600 MG PO TABS: 600.0000 mg | ORAL_TABLET | Freq: Four times a day (QID) | ORAL | Status: DC | PRN

## 2018-04-07 MED ORDER — PREDNISONE 50 MG PO TABS: 50.0000 mg | ORAL_TABLET | Freq: Every day | ORAL | Status: DC

## 2018-04-07 MED ORDER — AZITHROMYCIN 250 MG PO TABS
250.0000 mg | ORAL_TABLET | ORAL | Status: DC
  Administered 2018-04-08: 250 mg via ORAL
  Filled 2018-04-07: qty 1

## 2018-04-07 MED ORDER — FUROSEMIDE 20 MG PO TABS: 20.0000 mg | ORAL_TABLET | Freq: Every day | ORAL | Status: DC | PRN

## 2018-04-07 MED ORDER — FLUTICASONE PROPIONATE 50 MCG/ACT NA SUSP
2.0000 | Freq: Every day | NASAL | Status: DC
  Administered 2018-04-08: 2 via NASAL
  Filled 2018-04-07: qty 16

## 2018-04-07 MED ORDER — HEPARIN (PORCINE) IN NACL 100-0.45 UNIT/ML-% IJ SOLN
1100.0000 [IU]/h | INTRAMUSCULAR | Status: DC
  Administered 2018-04-07: 1100 [IU]/h via INTRAVENOUS
  Filled 2018-04-07: qty 250

## 2018-04-07 MED ORDER — FLUTICASONE FUROATE-VILANTEROL 200-25 MCG/INH IN AEPB: 1.0000 | INHALATION_SPRAY | Freq: Every day | RESPIRATORY_TRACT | Status: DC

## 2018-04-07 MED ORDER — ALBUTEROL SULFATE (2.5 MG/3ML) 0.083% IN NEBU: 3.0000 mL | INHALATION_SOLUTION | Freq: Four times a day (QID) | RESPIRATORY_TRACT | Status: DC | PRN

## 2018-04-07 MED ORDER — HEPARIN BOLUS VIA INFUSION
5500.0000 [IU] | Freq: Once | INTRAVENOUS | Status: AC
  Administered 2018-04-07: 5500 [IU] via INTRAVENOUS
  Filled 2018-04-07: qty 5500

## 2018-04-07 MED ORDER — DOXYCYCLINE HYCLATE 100 MG PO TABS
100.0000 mg | ORAL_TABLET | Freq: Two times a day (BID) | ORAL | Status: AC
  Administered 2018-04-07: 100 mg via ORAL
  Filled 2018-04-07: qty 1

## 2018-04-07 MED ORDER — POLYETHYLENE GLYCOL 3350 17 G PO PACK: 17.0000 g | PACK | Freq: Every day | ORAL | Status: DC | PRN

## 2018-04-07 MED ORDER — TIOTROPIUM BROMIDE MONOHYDRATE 18 MCG IN CAPS
18.0000 ug | ORAL_CAPSULE | Freq: Every day | RESPIRATORY_TRACT | Status: DC
  Filled 2018-04-07: qty 5

## 2018-04-07 MED ORDER — ALENDRONATE SODIUM 70 MG PO TABS: 70.0000 mg | ORAL_TABLET | ORAL | Status: DC

## 2018-04-07 NOTE — Progress Notes (Signed)
ANTICOAGULATION CONSULT NOTE - [facility] for heparin Indication: pulmonary embolus  Allergies  Allergen Reactions  . Tape Other (See Comments)    SKIN IS VERY THIN; CAN ONLY USE COBAN WRAPS DUE TO CONDITION OF SKIN!!    Patient Measurements: Height: 6' (182.9 cm) Weight: 154 lb 4.8 oz (70 kg)(Scale A) IBW/kg (Calculated) : 77.6 Heparin Dosing Weight: 70 kg  Vital Signs: Temp: 98.5 F (36.9 C) (10/01 2011) Temp Source: Oral (10/01 2011) BP: 174/95 (10/01 2011) Pulse Rate: 97 (10/01 2011)  Labs: Recent Labs    04/07/18 1146 04/07/18 2030  HGB 13.4  --   HCT 40.2  --   PLT 200  --   HEPARINUNFRC  --  1.24*  CREATININE 0.94  --     Estimated Creatinine Clearance: 72.4 mL/min (by C-G formula based on SCr of 0.94 mg/dL).  Assessment: 85 yoM admitted with PE and started on IV heparin. Initial heparin level elevated at 1.24, drawn appropriately by lab, no S/Sx bleeding per RN.  Goal of Therapy:  Heparin level 0.3-0.7 units/ml Monitor platelets by anticoagulation protocol: Yes   Plan:  -Hold heparin x1 hr -Resume heparin at reduce rate of 1100 units/hr -Check 8hr heparin level at 0700  Arrie Senate, PharmD, BCPS Clinical Pharmacist [phone] Please check AMION for all [facility] numbers 04/07/2018

## 2018-04-07 NOTE — Progress Notes (Signed)
ANTICOAGULATION CONSULT NOTE - Initial Consult  Pharmacy Consult for heparin Indication: pulmonary embolus  Allergies  Allergen Reactions  . Tape Other (See Comments)    SKIN IS VERY THIN; CAN ONLY USE COBAN WRAPS DUE TO CONDITION OF SKIN!!    Patient Measurements: Height: 6' (182.9 cm) Weight: 155 lb (70.3 kg) IBW/kg (Calculated) : 77.6 Heparin Dosing Weight: 70 kg  Vital Signs: Temp: 97.5 F (36.4 C) (10/01 1123) Temp Source: Oral (10/01 1123) BP: 158/83 (10/01 1300) Pulse Rate: 87 (10/01 1300)  Labs: Recent Labs    04/07/18 1146  HGB 13.4  HCT 40.2  PLT 200  CREATININE 0.94    Estimated Creatinine Clearance: 72.7 mL/min (by C-G formula based on SCr of 0.94 mg/dL).  Assessment: CC/HPI: 72 yo m presenting after 5 days SOB  PMH: COPD  Anticoag: none pta iv hep for PE   Renal: SCr 0.94  Heme/Onc: H&H 13.4/40.2, Plt 200  Goal of Therapy:  Heparin level 0.3-0.7 units/ml Monitor platelets by anticoagulation protocol: Yes   Plan:  Heparin 5500 x 1 Heparin gtt 1250 units/hr HL 2100 Daily HL CBC  Levester Fresh, PharmD, BCPS, BCCCP Clinical Pharmacist [phone]  Please check AMION for all [facility] numbers  04/07/2018 1:18 PM

## 2018-04-07 NOTE — Telephone Encounter (Signed)
Called and spoke with patients wife, advised her of MR response, patients wife verbalized understanding. Order has been placed. Nothing further needed.

## 2018-04-07 NOTE — ED Notes (Signed)
ED Provider at bedside. 

## 2018-04-07 NOTE — ED Triage Notes (Signed)
Seen Doctor 5 days ago for shortness of breath. Steroids and antibiotics given. Blood work resulted and told to go to the ED for evaluation and possible admission.  States have been having a productive cough yellow green sputum.  Denies chest pain and on home oxygen 2-3 liters.

## 2018-04-07 NOTE — Telephone Encounter (Signed)
Pt is currently over at [facility] ER.  Called [facility] to see if I could speak to the MD who was going to be caring for pt but they would be rounding on pt at 12pm.  Gave MR's cell number that he could be reached on once the doctor arrived to look at pt.

## 2018-04-07 NOTE — H&P (Addendum)
Date: 04/07/2018               Patient Name:  Francisco [name] MRN: [id number]  DOB: 05/09/1946 Age / Sex: 72 y.o., male   PCP: [name], MD         Medical Service: Internal Medicine Teaching Service         Attending Physician: Dr. Lucious Groves, DO    First Contact: Dr. Myrtie Hawk Pager: [phone]  Second Contact: Dr. Tarri Abernethy Pager: [phone]       After Hours (After 5p/  First Contact Pager: [phone]  weekends / holidays): Second Contact Pager: [phone]   Chief Complaint: Shortness of breath   History of Present Illness: [name], is a 72 y/o male with PMHx. of COPD (on 2.5-3 li nasal O2 at home). Orthostatic hypotension, EtOH use, referred by his pulmonologist to ED due to worsening of SOB, and + D-Dimer. Patient reports that since Thursday, he had worsening of DOE and detected O2 sat of 77% when on nasal O2. At home. He is at nasal O2 of 2.5 at rest and 3 li with activity. He reports requiring more O2 flow recently. He was seen by his pulmonologist where he found to have + D.Dimer. Ct angio showed pulmonary embolism and patient admitted to ED for further management. He denies fever, chest pain, syncope, orthopnea. Had some chronic bilateral edema with mild pain bilaterally. No recent surgery or hospitalization, how ever he endorses having very limited activity due to pulmonary dysfunction and DOE.  Patients also reports productive cough with yellow sputum in past couple of weeks, that treated by his PCP with Doxycycline. Denies any fever.    Meds:  Current Meds  Medication Sig  . albuterol (VENTOLIN HFA) 108 (90 Base) MCG/ACT inhaler USE 2 PUFFS EVERY 6 HOURS  AS NEEDED FOR WHEEZING (Patient taking differently: Inhale 2 puffs into the lungs every 6 (six) hours as needed. )  . alendronate (FOSAMAX) 70 MG tablet Take 1 tablet (70 mg total) by mouth once a week. Take with a full glass of water on an empty stomach.  [name] Kitchen azithromycin (ZITHROMAX) 250 MG tablet TAKE 1 TABLET BY  MOUTH ON  MONDAY, WEDNESDAY, AND  FRIDAY (Patient taking differently: Take 250 mg by mouth every Monday, Wednesday, and Friday. )  . Calcium Carbonate-Vit D-Min (CALCIUM 1200 PO) Take 1,200 mg by mouth daily.  . Calcium-Magnesium-Vitamin D (CALCIUM 1200+D3 PO) Take 1 tablet by mouth daily.  . cholecalciferol (VITAMIN D) 1000 units tablet Take 1,000 Units by mouth daily.  [name] Kitchen doxycycline (VIBRA-TABS) 100 MG tablet Take 1 tablet (100 mg total) by mouth 2 (two) times daily.  . fluticasone (FLONASE) 50 MCG/ACT nasal spray USE 2 SPRAYS IN EACH  NOSTRIL DAILY (Patient taking differently: Place 2 sprays into both nostrils daily. )  . furosemide (LASIX) 20 MG tablet Take 1 tablet (20 mg total) by mouth daily as needed for edema.  [name] Kitchen HYDROcodone-acetaminophen (NORCO/VICODIN) 5-325 MG tablet Take 1-2 tablets by mouth every 4 (four) hours as needed for moderate pain.  . OXYGEN Inhale 2 L into the lungs See admin instructions. 2 PO2 at rest and 3 PO2 with exertion. Continuous  . predniSONE (DELTASONE) 10 MG tablet Take 50mg x3days,40mg x3days,30mg x3days,20mg x3days,then continue on 10mg  daily (Patient taking differently: Take 10-50 mg by mouth See admin instructions. Take 50mg x3days,40mg x3days,30mg x3days,20mg x3days,then continue on 10mg  daily)  . SPIRIVA RESPIMAT 2.5 MCG/ACT AERS USE 2 SPRAYS (INHALATIONS)  ONCE DAILY (Patient taking differently: Place 2 sprays into  the nose daily. )  . SYMBICORT 160-4.5 MCG/ACT inhaler USE 2 PUFFS TWO TIMES DAILY (Patient taking differently: Inhale 2 puffs into the lungs 2 (two) times daily. )     Allergies: Allergies as of 04/07/2018 - Review Complete 04/07/2018  Allergen Reaction Noted  . Tape Other (See Comments) 11/02/2017   Past Medical History:  Diagnosis Date  . Chest x-ray abnormality   . Chronic rhinitis    -Sinus Ct 08/01/2009 >> Bilateral maxillary sinusitis with some mucosal thickeningin the sphenoid and frontal sinuses as well with air fluid levels present  -chronic rhinitis flyer Aug 04, 2009  . COPD (chronic obstructive pulmonary disease) [facility])    PFT's rec Jul 17, 2009  . Onychomycosis    Dr. Judi Cong    Family History:  Ovarian cancer in mother Breast cancer in sister Heart disease in mother Heart disease in father   Social History:  Former smoker (Sm[place]) No drug use No alcohol use   Review of Systems: Reports easy bruise, vertigo, urinary frequency and weak stream. Otherwise, ROS was negative except as per HPI.   Physical Exam: Blood pressure (!) 137/98, pulse 88, temperature (!) 97.5 F (36.4 C), temperature source Oral, resp. rate 14, height 6' (1.829 m), weight 70.3 kg, SpO2 98 %.  Physical Exam  Constitutional: He is oriented to person, place, and time. He appears well-developed and well-nourished. He does not appear ill. No distress.  Eyes: EOM are normal.  Neck: No hepatojugular reflux and no JVD present.  Cardiovascular: Normal rate and regular rhythm.  No murmur heard. Pulmonary/Chest: Effort normal. He has end inspiratory rale and Expiratory wheezes.  Abdominal: Soft. Bowel sounds are normal. He exhibits no distension. There is no tenderness.  Musculoskeletal:  Legs He exhibits bilateral 2+ edema up to mid tibia. Homan sign negative.  Skin: purpura on hands. Neurological: He is alert and oriented to person, place, and time.  Psychiatric: He has a normal mood and affect. His behavior is normal. His mood appears not anxious. He is not agitated.   D-Dimer: 1.88 (nl<0.5) BNP: 50 (0-100)   CBC Latest Ref Rng & Units 04/07/2018 04/03/2018 12/25/2017  WBC 4.0 - 10.5 K/uL 9.3 9.9 10.1  Hemoglobin 13.0 - 17.0 g/dL 13.4 14.0 14.0  Hematocrit 39.0 - 52.0 % 40.2 40.3 40.7  Platelets 150 - 400 K/uL 200 236.0 297.0    EKG: personally reviewed my interpretation is normal sinus rhythm  CXR: personally reviewed my interpretation is mild pulmonary edema and increased interstitial marking and haziness at right   CT  ANGIOGRAPHY CHEST WITH CONTRAST: IMPRESSION: 1. Nonocclusive pulmonary emboli centrally involving branches to the right middle lobe and right lower lobe. There is no evidence of right heart strain. No left pulmonary emboli are seen. 2. New compression deformities of T6 and T8 vertebral bodies with old compression of T7 vertebral body again noted. No retropulsion. 3. Thoracic and abdominal aortic atherosclerosis. 4. Diffuse changes of centrilobular and paraseptal emphysema.  Assessment & Plan by Problem: Principal Problem:   Pulmonary embolism (HCC)  Worsening of SOB and DOE:   *COPD exacerbation Patient with Hx of COPD and nasal O2 at home. With Hx of productive cough and worsening of DOE since last Thursday. He noticed decreased O2 saturation to 77% while on nasal O2 at home. Patient has been on Azithromycin 3 days per week in last year as well as 10 mg Prednisone QD. Doxycyclin has been added to his regimen last week and Prednisone dose increased to  50 mg when seen by Primary care.   Patient is afebrile, No leucocytosis. CXR showed increased intrestitial marking mostly at right side, with some worsening comparing to previous CXR.  -Dc Doxycyclin -Continue Azithromycin 259 mg MWF -Continue with Prednisone 50 mg (taper to 10 mg)  *PE When seen by pulmonologist, pulmonary work up revealed + D-Dimer. and Ct angio showed PE.  Patient referred to ED. Stable on arrival. Vitals on arrival to ED: PR:92, RR:20  No right heart strain on EKG. Started with IV Heparin.  He denies any recent hospitalization, surgery, unilateral leg swelling or pain. Does not report constitutional symptoms, change in BM, (last colonoscopy was 8 years ago that showed 3 polyps), no further colonoscopy performed due to pulmonary function and risk of sedation. (Per patient)  This may still consider as a provoked PE due to immobilization. (Patient and his family endorse that he has minor activity and sitting on bed  most of the time due to low capacity for activity.) It is a non reversible situation and will need life long Tx.  -Continue nasal O2 3-4 li -Continue Cardiac monitoring -Switch IV Heparin  to Xarelto. (Plan for taking Xarelto 15 mg BID x 3 weeks then 20 mg QD life long)   Alcohol use: Patient reports drinking of 6 beers per day. -CIWA protocol  Diet: Regular Fluids: None DVT ppx: IV Hep Code status: DNR  Patient admitted due to increased O2 requirement and poor functional pulmonary reserve. And due to risk of decompensation. Dispo: Admit patient to Inpatient with expected length of stay greater than 2 midnights.  SignedDewayne Hatch, MD 04/07/2018, 6:06 PM  Pager: [phone]

## 2018-04-07 NOTE — ED Notes (Signed)
Heparin verified with Hal Hope, RN

## 2018-04-07 NOTE — Telephone Encounter (Signed)
Received a notification from [place] with [facility] letting me know that pt's CT to rule out PE was positive.  Made MR aware of the results and per MR, have pt to go [facility] ER.  Spoke with stacey and also pt's wife, Katy Apo stating to her to take pt to [facility] ER.  Lillie expressed understanding. Routing to MR

## 2018-04-07 NOTE — Telephone Encounter (Signed)
Spoke to ER doc. Likely needs to be a triad admit  Ct Angio Chest W/cm &/or Wo Cm  Result Date: 04/07/2018 CLINICAL DATA:  History of COPD, emphysema, increased shortness of breath with decreased O2 saturation with exertion, elevated D-dimer EXAM: CT ANGIOGRAPHY CHEST WITH CONTRAST TECHNIQUE: Multidetector CT imaging of the chest was performed using the standard protocol during bolus administration of intravenous contrast. Multiplanar CT image reconstructions and MIPs were obtained to evaluate the vascular anatomy. CONTRAST:  47mL ISOVUE-370 IOPAMIDOL (ISOVUE-370) INJECTION 76% COMPARISON:  Chest x-ray of 04/03/2017 and CT chest of 07/17/2017 FINDINGS: Cardiovascular: There is good opacification of the pulmonary arteries. Pulmonary emboli are noted centrally within branches to the right lower lobe and right middle lobe. These pulmonary emboli are not completely occlusive. No definite left pulmonary artery emboli are seen. There is no evidence of right heart strain. The heart is within upper limits of normal. The thoracic aorta opacifies with no acute abnormality. Moderate thoracic aortic atherosclerosis is present. The mid ascending thoracic aorta measures 38 mm in diameter. Mediastinum/Nodes: No mediastinal or hilar adenopathy is seen. The thyroid gland is unremarkable. There may be a small hiatal hernia present. Lungs/Pleura: On lung window images, biapical pleuroparenchymal scarring is present right worse than left. Diffuse changes of centrilobular and paraseptal emphysema are noted. No focal infiltrate is seen and there is no evidence of pleural effusion. No suspicious lung nodule is evident. Upper Abdomen: Other than abdominal aortic atherosclerotic change, no significant abnormality is seen within the upper abdomen. Musculoskeletal: There was a previous compression deformity of T7, but there are now partial compression deformities of adjacent T6 and T8 vertebral bodies. No retropulsion is seen. No lytic or  blastic lesion is noted. Review of the MIP images confirms the above findings. IMPRESSION: 1. Nonocclusive pulmonary emboli centrally involving branches to the right middle lobe and right lower lobe. There is no evidence of right heart strain. No left pulmonary emboli are seen. 2. New compression deformities of T6 and T8 vertebral bodies with old compression of T7 vertebral body again noted. No retropulsion. 3. Thoracic and abdominal aortic atherosclerosis. 4. Diffuse changes of centrilobular and paraseptal emphysema. Electronically Signed   By: Ivar Drape M.D.   On: 04/07/2018 10:55

## 2018-04-07 NOTE — ED Provider Notes (Signed)
[facility] EMERGENCY DEPARTMENT Provider Note   CSN: [id number] Arrival date & time: 04/07/18  1114     History   Chief Complaint Chief Complaint  Patient presents with  . Shortness of Breath    HPI ALDRIDGE KRZYZANOWSKI II is a 72 y.o. male.  The history is provided by the patient.  Shortness of Breath  This is a new problem. The problem occurs intermittently.The current episode started 2 days ago. The problem has been gradually worsening. Pertinent negatives include no fever, no rhinorrhea, no sore throat, no ear pain, no cough, no sputum production, no orthopnea, no chest pain, no vomiting, no abdominal pain, no rash, no leg pain and no leg swelling. Precipitated by: Patient found to have PE on CT scan ordered by pulmonologist due to worsening SOB and hypoxia with exertion despite COPD treatment.  He has tried ipratropium inhalers and beta-agonist inhalers for the symptoms. The treatment provided no relief. Associated medical issues include COPD and PE (diagnosed today).    Past Medical History:  Diagnosis Date  . Chest x-ray abnormality   . Chronic rhinitis    -Sinus Ct 08/01/2009 >> Bilateral maxillary sinusitis with some mucosal thickeningin the sphenoid and frontal sinuses as well with air fluid levels present -chronic rhinitis flyer Aug 04, 2009  . COPD (chronic obstructive pulmonary disease) [facility])    PFT's rec Jul 17, 2009  . Onychomycosis    Dr. Judi Cong    Patient Active Problem List   Diagnosis Date Noted  . Essential tremor 03/31/2018  . BPPV (benign paroxysmal positional vertigo), right 02/10/2018  . Osteoporosis 11/19/2017  . Syncope 11/02/2017  . Thoracic compression fracture ([place]) 11/02/2017  . Essential hypertension 11/02/2017  . Venous stasis dermatitis of both lower extremities 11/02/2017  . Forearm laceration, right, initial encounter 11/02/2017  . Chronic respiratory failure with hypoxia ([place]) 09/23/2017  . Pleuritic pain 07/17/2017  . BPH  associated with nocturia 06/25/2017  . Leukocytosis 12/19/2016  . Hyperglycemia 06/12/2016  . Hyperlipidemia 07/22/2014  . Onychomycosis 07/22/2014  . Left ankle swelling 07/22/2014  . History of skin cancer 05/17/2014  . Chronic rhinitis 10/28/2012  . Pulmonary nodule 09/23/2011  . Former smoker 08/23/2008  . COPD (chronic obstructive pulmonary disease) with emphysema ([place]) 08/23/2008  . History of colonic polyps 08/23/2008    Past Surgical History:  Procedure Laterality Date  . APPENDECTOMY    . TONSILLECTOMY          Home Medications    Prior to Admission medications   Medication Sig Start Date End Date Taking? Authorizing Provider  albuterol (VENTOLIN HFA) 108 (90 Base) MCG/ACT inhaler USE 2 PUFFS EVERY 6 HOURS  AS NEEDED FOR WHEEZING 07/30/17   Byrum, Rose Fillers, MD  alendronate (FOSAMAX) 70 MG tablet Take 1 tablet (70 mg total) by mouth once a week. Take with a full glass of water on an empty stomach. 02/23/18   [name], MD  azithromycin (ZITHROMAX) 250 MG tablet TAKE 1 TABLET BY MOUTH ON  MONDAY, [facility] AND  FRIDAY 03/03/18   Brand Males, MD  Calcium Carbonate-Vit D-Min (CALCIUM 1200 PO) Take 1,200 mg by mouth daily.    [provider]  doxycycline (VIBRA-TABS) 100 MG tablet Take 1 tablet (100 mg total) by mouth 2 (two) times daily. 04/03/18   Brand Males, MD  fluticasone (FLONASE) 50 MCG/ACT nasal spray USE 2 SPRAYS IN EACH  NOSTRIL DAILY 03/03/18   Brand Males, MD  Fluticasone-Umeclidin-Vilant (TRELEGY ELLIPTA) 100-62.5-25 MCG/INH AEPB  Inhale 1 puff into the lungs daily. Patient not taking: Reported on 03/31/2018 01/29/18   Lauraine Rinne, NP  furosemide (LASIX) 20 MG tablet Take 1 tablet (20 mg total) by mouth daily as needed for edema. Patient not taking: Reported on 04/03/2018 11/14/17   [name], MD  HYDROcodone-acetaminophen (NORCO/VICODIN) 5-325 MG tablet Take 1-2 tablets by mouth every 4 (four) hours as needed for moderate  pain. 03/31/18   [name], MD  Misc. Devices (PULSE OXIMETER FOR FINGER) MISC 1 Device by Does not apply route as needed. 11/05/17   Geradine Girt, DO  OXYGEN Inhale 2 L into the lungs continuous. 2L O2 at rest and 3L O2 with exertion.    [provider]  predniSONE (DELTASONE) 10 MG tablet TAKE 1 TABLET BY MOUTH  DAILY WITH BREAKFAST 09/23/17   Collene Gobble, MD  predniSONE (DELTASONE) 10 MG tablet Take 50mg x3days,40mg x3days,30mg x3days,20mg x3days,then continue on 10mg  daily 04/03/18   Brand Males, MD  SPIRIVA RESPIMAT 2.5 MCG/ACT AERS USE 2 SPRAYS (INHALATIONS)  ONCE DAILY 03/03/18   Brand Males, MD  SYMBICORT 160-4.5 MCG/ACT inhaler USE 2 PUFFS TWO TIMES DAILY 03/02/18   Deneise Lever, MD  terbinafine (LAMISIL) 250 MG tablet terbinafine HCl 250 mg tablet 11/27/17   [provider]    Family History Family History  Problem Relation Age of Onset  . Heart disease Mother        CABG in her 19s, nonsmoker  . Cancer Mother   . Stroke Father   . Heart disease Father        Died of MI at age 67, smoker  . Hepatitis Sister   . Coronary artery disease Other        male 1st degree relative <60    Social History Social History   Tobacco Use  . Smoking status: Former Smoker    Packs/day: 2.00    Years: 10.00    Pack years: 20.00    Types: Pipe, Cigarettes    Start date: 10/06/2017  . Smokeless tobacco: Never Used  . Tobacco comment: Smokes pipe up to 2-3 times a day  Substance Use Topics  . Alcohol use: Yes    Alcohol/week: 18.0 standard drinks    Types: 18 Cans of beer per week    Comment: daily  . Drug use: No     Allergies   Tape   Review of Systems Review of Systems  Constitutional: Negative for chills and fever.  HENT: Negative for ear pain, rhinorrhea and sore throat.   Eyes: Negative for pain and visual disturbance.  Respiratory: Positive for shortness of breath. Negative for cough and sputum production.   Cardiovascular:  Negative for chest pain, palpitations, orthopnea and leg swelling.  Gastrointestinal: Negative for abdominal pain and vomiting.  Genitourinary: Negative for dysuria and hematuria.  Musculoskeletal: Negative for arthralgias and back pain.  Skin: Negative for color change and rash.  Neurological: Negative for seizures and syncope.  All other systems reviewed and are negative.    Physical Exam Updated Vital Signs  ED Triage Vitals  Enc Vitals Group     BP 04/07/18 1123 (!) 170/90     Pulse Rate 04/07/18 1123 92     Resp 04/07/18 1123 20     Temp 04/07/18 1123 (!) 97.5 F (36.4 C)     Temp Source 04/07/18 1123 Oral     SpO2 04/07/18 1123 100 %     Weight 04/07/18 1125 155 lb (70.3 kg)  Height 04/07/18 1125 6' (1.829 m)     Head Circumference --      Peak Flow --      Pain Score 04/07/18 1125 0     Pain Loc --      Pain Edu? --      Excl. in [place]? --     Physical Exam  Constitutional: He appears well-developed and well-nourished.  HENT:  Head: Normocephalic and atraumatic.  Eyes: Pupils are equal, round, and reactive to light. Conjunctivae and EOM are normal.  Neck: Normal range of motion. Neck supple.  Cardiovascular: Normal rate and regular rhythm.  No murmur heard. Pulmonary/Chest: Effort normal. Tachypnea noted. He has no decreased breath sounds. He has wheezes.  Abdominal: Soft. There is no tenderness.  Musculoskeletal: He exhibits no edema.  Neurological: He is alert.  Skin: Skin is warm and dry. Capillary refill takes less than 2 seconds.  Psychiatric: He has a normal mood and affect.  Nursing note and vitals reviewed.    ED Treatments / Results  Labs (all labs ordered are listed, but only abnormal results are displayed) Labs Reviewed  CBC WITH DIFFERENTIAL/PLATELET - Abnormal; Notable for the following components:      Result Value   RBC 4.08 (*)    Neutro Abs 8.3 (*)    Lymphs Abs 0.4 (*)    Abs Immature Granulocytes 0.3 (*)    All other components  within normal limits  BASIC METABOLIC PANEL - Abnormal; Notable for the following components:   Glucose, Bld 114 (*)    All other components within normal limits  I-STAT TROPONIN, ED    EKG EKG Interpretation  Date/Time:  Tuesday April 07 2018 11:19:39 EDT Ventricular Rate:  95 PR Interval:  166 QRS Duration: 76 QT Interval:  366 QTC Calculation: 459 R Axis:   -76 Text Interpretation:  Normal sinus rhythm with sinus arrhythmia Biatrial enlargement Left axis deviation Confirmed by Lennice Sites [phone]) on 04/07/2018 12:44:41 PM   Radiology Ct Angio Chest W/cm &/or Wo Cm  Result Date: 04/07/2018 CLINICAL DATA:  History of COPD, emphysema, increased shortness of breath with decreased O2 saturation with exertion, elevated D-dimer EXAM: CT ANGIOGRAPHY CHEST WITH CONTRAST TECHNIQUE: Multidetector CT imaging of the chest was performed using the standard protocol during bolus administration of intravenous contrast. Multiplanar CT image reconstructions and MIPs were obtained to evaluate the vascular anatomy. CONTRAST:  70mL ISOVUE-370 IOPAMIDOL (ISOVUE-370) INJECTION 76% COMPARISON:  Chest x-ray of 04/03/2017 and CT chest of 07/17/2017 FINDINGS: Cardiovascular: There is good opacification of the pulmonary arteries. Pulmonary emboli are noted centrally within branches to the right lower lobe and right middle lobe. These pulmonary emboli are not completely occlusive. No definite left pulmonary artery emboli are seen. There is no evidence of right heart strain. The heart is within upper limits of normal. The thoracic aorta opacifies with no acute abnormality. Moderate thoracic aortic atherosclerosis is present. The mid ascending thoracic aorta measures 38 mm in diameter. Mediastinum/Nodes: No mediastinal or hilar adenopathy is seen. The thyroid gland is unremarkable. There may be a small hiatal hernia present. Lungs/Pleura: On lung window images, biapical pleuroparenchymal scarring is present right  worse than left. Diffuse changes of centrilobular and paraseptal emphysema are noted. No focal infiltrate is seen and there is no evidence of pleural effusion. No suspicious lung nodule is evident. Upper Abdomen: Other than abdominal aortic atherosclerotic change, no significant abnormality is seen within the upper abdomen. Musculoskeletal: There was a previous compression deformity of T7, but  there are now partial compression deformities of adjacent T6 and T8 vertebral bodies. No retropulsion is seen. No lytic or blastic lesion is noted. Review of the MIP images confirms the above findings. IMPRESSION: 1. Nonocclusive pulmonary emboli centrally involving branches to the right middle lobe and right lower lobe. There is no evidence of right heart strain. No left pulmonary emboli are seen. 2. New compression deformities of T6 and T8 vertebral bodies with old compression of T7 vertebral body again noted. No retropulsion. 3. Thoracic and abdominal aortic atherosclerosis. 4. Diffuse changes of centrilobular and paraseptal emphysema. Electronically Signed   By: Ivar Drape M.D.   On: 04/07/2018 10:55    Procedures .Critical Care Performed by: Lennice Sites, DO Authorized by: Lennice Sites, DO   Critical care provider statement:    Critical care time (minutes):  38   Critical care time was exclusive of:  Separately billable procedures and treating other patients and teaching time   Critical care was necessary to treat or prevent imminent or life-threatening deterioration of the following conditions:  Respiratory failure   Critical care was time spent personally by me on the following activities:  Discussions with consultants, discussions with primary provider, development of treatment plan with patient or surrogate, evaluation of patient's response to treatment, examination of patient, obtaining history from patient or surrogate, ordering and performing treatments and interventions, ordering and review of  laboratory studies, ordering and review of radiographic studies, pulse oximetry, re-evaluation of patient's condition and review of old charts   I assumed direction of critical care for this patient from another provider in my specialty: no     (including critical care time)  Medications Ordered in ED Medications  heparin bolus via infusion 5,500 Units (has no administration in time range)  heparin ADULT infusion 100 units/mL (25000 units/252mL sodium chloride 0.45%) (has no administration in time range)     Initial Impression / Assessment and Plan / ED Course  I have reviewed the triage vital signs and the nursing notes.  Pertinent labs & imaging results that were available during my care of the patient were reviewed by me and considered in my medical decision making (see chart for details).     JOSEPH BIAS II is a 72 year old male with history of COPD who presents to the ED with pulmonary embolism diagnosed by pulmonologist.  Patient with unremarkable vitals.  Currently on 3 L of oxygen at rest.  Patient having worsening shortness of breath, hypoxia especially with exertion requiring more oxygen than normal.  Was being treated by his pulmonologist with whom I spoke to on the phone for a COPD exacerbation and was not improving.  Therefore he got a PE study that showed multiple clots.  Patient comfortable at rest.  Clear breath sounds.  Has some swelling in his legs that appears chronic.  Lab work was overall unremarkable.  EKG with no ischemic changes.  Troponin within normal limits.  No right heart strain found on PE scan.  CT scan shows possible compression fractures, likely from old falls.  Patient has not had a fall or trauma and 6 months.  Not tender in this area.  Patient started on IV heparin after IV heparin bolus.  Patient with possible old patient to be admitted to internal medicine service for further care.  Hemodynamically stable throughout my care.  This chart was dictated using  voice recognition software.  Despite best efforts to proofread,  errors can occur which can change the documentation meaning.  Final Clinical Impressions(s) / ED Diagnoses   Final diagnoses:  Acute respiratory failure with hypoxia ([place])  Acute pulmonary embolism without acute cor pulmonale, unspecified pulmonary embolism type [facility])    ED Discharge Orders    None       Lennice Sites, DO 04/07/18 1306

## 2018-04-07 NOTE — Telephone Encounter (Signed)
D-dimer very high 1.88 Unfortunatley this means he needs do to CT angio chest 04/07/2018 rule out PE. Better he do it in monring. Latest bMet 4d ago so does not need another one   Recent Labs  Lab 04/03/18 1148  CREATININE 0.93   Estimated Creatinine Clearance: 73.4 mL/min (by C-G formula based on SCr of 0.93 mg/dL).

## 2018-04-08 ENCOUNTER — Other Ambulatory Visit: Payer: Self-pay | Admitting: Internal Medicine

## 2018-04-08 DIAGNOSIS — Z7952 Long term (current) use of systemic steroids: Secondary | ICD-10-CM

## 2018-04-08 DIAGNOSIS — Z91048 Other nonmedicinal substance allergy status: Secondary | ICD-10-CM

## 2018-04-08 DIAGNOSIS — I2699 Other pulmonary embolism without acute cor pulmonale: Principal | ICD-10-CM

## 2018-04-08 DIAGNOSIS — Z7901 Long term (current) use of anticoagulants: Secondary | ICD-10-CM

## 2018-04-08 DIAGNOSIS — I951 Orthostatic hypotension: Secondary | ICD-10-CM | POA: Diagnosis not present

## 2018-04-08 DIAGNOSIS — Z9981 Dependence on supplemental oxygen: Secondary | ICD-10-CM

## 2018-04-08 DIAGNOSIS — J449 Chronic obstructive pulmonary disease, unspecified: Secondary | ICD-10-CM | POA: Diagnosis not present

## 2018-04-08 DIAGNOSIS — M4854XA Collapsed vertebra, not elsewhere classified, thoracic region, initial encounter for fracture: Secondary | ICD-10-CM | POA: Diagnosis not present

## 2018-04-08 DIAGNOSIS — Z720 Tobacco use: Secondary | ICD-10-CM

## 2018-04-08 DIAGNOSIS — Z79899 Other long term (current) drug therapy: Secondary | ICD-10-CM

## 2018-04-08 DIAGNOSIS — Z87311 Personal history of (healed) other pathological fracture: Secondary | ICD-10-CM

## 2018-04-08 DIAGNOSIS — I700 Atherosclerosis of aorta: Secondary | ICD-10-CM

## 2018-04-08 LAB — HIV ANTIBODY (ROUTINE TESTING W REFLEX): HIV Screen 4th Generation wRfx: NONREACTIVE

## 2018-04-08 LAB — BASIC METABOLIC PANEL
ANION GAP: 9 (ref 5–15)
BUN: 11 mg/dL (ref 8–23)
CHLORIDE: 97 mmol/L — AB (ref 98–111)
CO2: 27 mmol/L (ref 22–32)
Calcium: 8.6 mg/dL — ABNORMAL LOW (ref 8.9–10.3)
Creatinine, Ser: 0.98 mg/dL (ref 0.61–1.24)
GFR calc Af Amer: >60 mL/min (ref >60)
GFR calc non Af Amer: >60 mL/min (ref >60)
GLUCOSE: 102 mg/dL — AB (ref 70–99)
Potassium: 3.5 mmol/L (ref 3.5–5.1)
Sodium: 133 mmol/L — ABNORMAL LOW (ref 135–145)

## 2018-04-08 LAB — CBC
HEMATOCRIT: 36.8 % — AB (ref 39.0–52.0)
Hemoglobin: 12.6 g/dL — ABNORMAL LOW (ref 13.0–17.0)
MCH: 33.3 pg (ref 26.0–34.0)
MCHC: 34.2 g/dL (ref 30.0–36.0)
MCV: 97.4 fL (ref 78.0–100.0)
Platelets: 212 10*3/uL (ref 150–400)
RBC: 3.78 MIL/uL — ABNORMAL LOW (ref 4.22–5.81)
RDW: 12.1 % (ref 11.5–15.5)
WBC: 9.6 10*3/uL (ref 4.0–10.5)

## 2018-04-08 LAB — HEPARIN LEVEL (UNFRACTIONATED): Heparin Unfractionated: 1.04 IU/mL — ABNORMAL HIGH (ref 0.30–0.70)

## 2018-04-08 MED ORDER — PREDNISONE 20 MG PO TABS: 30.0000 mg | ORAL_TABLET | Freq: Every day | ORAL | Status: DC

## 2018-04-08 MED ORDER — PREDNISONE 10 MG PO TABS: 30.0000 mg | ORAL_TABLET | Freq: Every day | ORAL | 0 refills | Status: DC

## 2018-04-08 MED ORDER — PREDNISONE 10 MG PO TABS: 10.0000 mg | ORAL_TABLET | Freq: Every day | ORAL | Status: DC

## 2018-04-08 MED ORDER — PREDNISONE 20 MG PO TABS: 20.0000 mg | ORAL_TABLET | Freq: Every day | ORAL | 0 refills | Status: DC

## 2018-04-08 MED ORDER — PREDNISONE 10 MG PO TABS: 10.0000 mg | ORAL_TABLET | Freq: Every day | ORAL | 0 refills | Status: DC

## 2018-04-08 MED ORDER — BUDESONIDE-FORMOTEROL FUMARATE 160-4.5 MCG/ACT IN AERO
2.0000 | INHALATION_SPRAY | Freq: Two times a day (BID) | RESPIRATORY_TRACT | Status: DC
  Filled 2018-04-08: qty 6

## 2018-04-08 MED ORDER — TIOTROPIUM BROMIDE MONOHYDRATE 2.5 MCG/ACT IN AERS
2.0000 | INHALATION_SPRAY | Freq: Every day | RESPIRATORY_TRACT | Status: DC
  Filled 2018-04-08: qty 1

## 2018-04-08 MED ORDER — RIVAROXABAN 15 MG PO TABS
15.0000 mg | ORAL_TABLET | Freq: Two times a day (BID) | ORAL | Status: DC
  Administered 2018-04-08: 15 mg via ORAL
  Filled 2018-04-08: qty 1

## 2018-04-08 MED ORDER — RIVAROXABAN (XARELTO) VTE STARTER PACK (15 & 20 MG): ORAL_TABLET | ORAL | 0 refills | Status: DC

## 2018-04-08 MED ORDER — HEPARIN (PORCINE) IN NACL 100-0.45 UNIT/ML-% IJ SOLN
900.0000 [IU]/h | INTRAMUSCULAR | Status: DC
  Administered 2018-04-08: 900 [IU]/h via INTRAVENOUS

## 2018-04-08 MED ORDER — PREDNISONE 20 MG PO TABS
40.0000 mg | ORAL_TABLET | Freq: Every day | ORAL | Status: DC
  Administered 2018-04-08: 40 mg via ORAL
  Filled 2018-04-08: qty 2

## 2018-04-08 MED ORDER — PREDNISONE 20 MG PO TABS: 20.0000 mg | ORAL_TABLET | Freq: Every day | ORAL | Status: DC

## 2018-04-08 MED ORDER — PREDNISONE 20 MG PO TABS: 40.0000 mg | ORAL_TABLET | Freq: Every day | ORAL | 0 refills | Status: DC

## 2018-04-08 MED FILL — XARELTO STARTER PACK: 15 & 20 | 30 days supply | Qty: 51 | Fill #0

## 2018-04-08 NOTE — Progress Notes (Signed)
PT refuses to use our meds. Only wants to use meds from home

## 2018-04-08 NOTE — Care Management CC44 (Signed)
Condition Code 44 Documentation Completed  Patient Details  Name: Francisco [name] MRN: [id number] Date of Birth: Nov 27, 1945   Condition Code 44 given:  Yes Patient signature on Condition Code 44 notice:  Yes Documentation of 2 MD's agreement:  Yes Code 44 added to claim:  Yes    Erenest Rasher, RN 04/08/2018, 2:05 PM

## 2018-04-08 NOTE — Care Management Note (Signed)
Case Management Note  Patient Details  Name: Francisco [name] MRN: [id number] Date of Birth: 02-22-1946  Subjective/Objective:   PE                 Action/Plan: Spoke to pt and wife at bedside. Has oxygen ([facility]), RW, bedside commode and cane. Provided pt with info on Xarelto Copay $35.   Expected Discharge Date:  04/08/18               Expected Discharge Plan:  [place]  In-House Referral:  NA  Discharge planning Services  CM Consult  Post Acute Care Choice:  NA Choice offered to:  NA  DME Arranged:  N/A DME Agency:  NA  HH Arranged:  NA HH Agency:  NA  Status of Service:  Completed, signed off  If discussed at [place] of Stay Meetings, dates discussed:    Additional Comments:  Erenest Rasher, RN 04/08/2018, 2:11 PM

## 2018-04-08 NOTE — Progress Notes (Addendum)
ANTICOAGULATION CONSULT NOTE - [place] for heparin Indication: pulmonary embolus  Allergies  Allergen Reactions  . Tape Other (See Comments)    SKIN IS VERY THIN; CAN ONLY USE COBAN WRAPS DUE TO CONDITION OF SKIN!!    Patient Measurements: Height: 6' (182.9 cm) Weight: 154 lb 1.6 oz (69.9 kg)(Scale A) IBW/kg (Calculated) : 77.6 Heparin Dosing Weight: 70 kg  Vital Signs: Temp: 97.7 F (36.5 C) (10/02 0337) Temp Source: Oral (10/02 0337) BP: 137/81 (10/02 0337) Pulse Rate: 99 (10/02 0337)  Labs: Recent Labs    04/07/18 1146 04/07/18 2030 04/08/18 0430 04/08/18 0658  HGB 13.4  --  12.6*  --   HCT 40.2  --  36.8*  --   PLT 200  --  212  --   HEPARINUNFRC  --  1.24*  --  1.04*  CREATININE 0.94  --  0.98  --     Estimated Creatinine Clearance: 69.3 mL/min (by C-G formula based on SCr of 0.98 mg/dL).  Assessment: Francisco [name] admitted with PE and started on IV heparin. Second heparin level still elevated at 1.04, drawn appropriately by lab from other arm, no S/Sx bleeding per RN.  H&H 12.6/36.8, platelets 212k.  Goal of Therapy:  Heparin level 0.3-0.7 units/ml Monitor platelets by anticoagulation protocol: Yes   Plan:  -Hold heparin x1 hr -Resume heparin at reduce rate of 900 units/hr -Check 8hr heparin level at [place], PharmD PGY1 Pharmacy Resident Phone [phone] 04/08/2018    8:43 AM   Addendum:  Transitioned to Xarelto for PE treatment.  Francisco [name], PharmD PGY1 Pharmacy Resident Phone [phone] 04/08/2018    11:06 AM

## 2018-04-08 NOTE — Discharge Instructions (Signed)
Thank you for allowing Francisco [name] taking care of you at [facility]. We are glad that you feel better. Please make sure that you take your medications as instructed and follow up with your primary care in a week. As we talked , we start you with a new medication: Xarelto to prevent forming clot in your body. We talked about risks and benefits. You may find more information about this medication below. Please contact Francisco [name] if you have any question or concern. If developed any bleeding, please contact Francisco [name] or your primary doctor. As always, if having severe symptoms, please seek medical attention at emergency room.  Thanks, Dr. Linna Hoff  Bleeding Precautions When on Anticoagulant Therapy WHAT IS ANTICOAGULANT THERAPY? Anticoagulant therapy is taking medicine to prevent or reduce blood clots. It is also called blood thinner therapy. Blood clots that form in your blood vessels can be dangerous. They can break loose and travel to your heart, lungs, or brain. This increases your risk of a heart attack or stroke. Anticoagulant therapy causes blood to clot more slowly. You may need anticoagulant therapy if you have:  A medical condition that increases the likelihood that blood clots will form.  A heart defect or a problem with heart rhythm. It is also a common treatment after heart surgery, such as valve replacement. WHAT ARE COMMON TYPES OF ANTICOAGULANT THERAPY? Anticoagulant medicine can be injected or taken by mouth.If you need anticoagulant therapy quickly at the hospital, the medicine may be injected under your skin or given through an IV tube. Heparin is a common example of an anticoagulant that you may get at the hospital. Most anticoagulant therapy is in the form of pills that you take at home every day. These may include:  Aspirin. This common blood thinner works by preventing blood cells (platelets) from sticking together to form a clot. Aspirin is not as strong as anticoagulants that slow  down the time that it takes for your body to form a clot.  Clopidogrel. This is a newer type of drug that affects platelets. It is stronger than aspirin.  Warfarin. This is the most common anticoagulant. It changes the way your body uses vitamin K, a vitamin that helps your blood to clot. The risk of bleeding is higher with warfarin than with aspirin. You will need frequent blood tests to make sure you are taking the safest amount.  New anticoagulants. Several new drugs have been approved. They are all taken by mouth. Studies show that these drugs work as well as warfarin. They do not require blood testing. They may cause less bleeding risk than warfarin. WHAT DO I NEED TO REMEMBER WHEN TAKING ANTICOAGULANT THERAPY? Anticoagulant therapy decreases your risk of forming a blood clot, but it increases your risk of bleeding. Work closely with your health care provider to make sure you are taking your medicine safely. These tips can help:  Learn ways to reduce your risk of bleeding.  If you are taking warfarin: ? Have blood tests as ordered by your health care provider. ? Do not make any sudden changes to your diet. Vitamin K in your diet can make warfarin less effective. ? Do not get pregnant. This medicine may cause birth defects.  Take your medicine at the same time every day. If you forget to take your medicine, take it as soon as you remember. If you miss a whole day, do not double your dose of medicine. Take your normal dose and call your health care provider to  check in.  Do not stop taking your medicine on your own.  Tell your health care provider before you start taking any new medicine, vitamin, or herbal product. Some of these could interfere with your therapy.  Tell all of your health care providers that you are on anticoagulant therapy.  Do not have surgery, medical procedures, or dental work until you tell your health care provider that you are on anticoagulant therapy. WHAT CAN  AFFECT HOW ANTICOAGULANTS WORK? Certain foods, vitamins, medicines, supplements, and herbal medicines change the way that anticoagulant therapy works. They may increase or decrease the effects of your anticoagulant therapy. Either result can be dangerous for you.  Many over-the-counter medicines for pain, colds, or stomach problems interfere with anticoagulant therapy. Take these only as told by your health care provider.  Do not drink alcohol. It can interfere with your medicine and increase your risk of an injury that causes bleeding.  If you are taking warfarin, do not begin eating more foods that contain vitamin K. These include leafy green vegetables. Ask your health care provider if you should avoid any foods. WHAT ARE SOME WAYS TO PREVENT BLEEDING? You can prevent bleeding by taking certain precautions:  Be extra careful when you use knives, scissors, or other sharp objects.  Use an electric razor instead of a blade.  Do not use toothpicks.  Use a soft toothbrush.  Wear shoes that have nonskid soles.  Use bath mats and handrails in your bathroom.  Wear gloves while you do yard work.  Wear a helmet when you ride a bike.  Wear your seat belt.  Prevent falls by removing loose rugs and extension cords from areas where you walk.  Do not play contact sports or participate in other activities that have a high risk of injury. [place] PROVIDER? Call your health care provider if:  You miss a dose of medicine: ? And you are not sure what to do. ? For more than one day.  You have: ? Menstrual bleeding that is heavier than normal. ? Blood in your urine. ? A bloody nose or bleeding gums. ? Easy bruising. ? Blood in your stool (feces) or have black and tarry stool. ? Side effects from your medicine.  You feel weak or dizzy.  You become pregnant. Seek immediate medical care if:  You have bleeding that will not stop.  You have sudden and severe  headache or belly pain.  You vomit or you cough up bright red blood.  You have a severe blow to your head. WHAT ARE SOME QUESTIONS TO ASK MY HEALTH CARE PROVIDER?  What is the best anticoagulant therapy for my condition?  What side effects should I watch for?  When should I take my medicine? What should I do if I forget to take it?  Will I need to have regular blood tests?  Do I need to change my diet? Are there foods or drinks that I should avoid?  What activities are safe for me?  What should I do if I want to get pregnant? This information is not intended to replace advice given to you by your health care provider. Make sure you discuss any questions you have with your health care provider. Document Released: 06/05/2015 Document Reviewed: 06/05/2015 Elsevier Interactive Patient Education  2017 [employer].

## 2018-04-08 NOTE — Care Management Obs Status (Signed)
[place] NOTIFICATION   Patient Details  Name: Francisco [name] MRN: [id number] Date of Birth: August 20, 1945   Medicare Observation Status Notification Given:  Yes    Erenest Rasher, RN 04/08/2018, 2:05 PM

## 2018-04-08 NOTE — Care Management (Signed)
#   4.   S/W   [name] @ [facility]  # [id number]  XARELTO  20 MG DAILY COVER- YES CO-PAY- $ 35.00   Q/L ONE PILL PER DAY TIER- 2  DRUG PRIOR APPROVAL- NO  PREFERRED PHARMACY :  YES   [facility]

## 2018-04-08 NOTE — Progress Notes (Signed)
Pt reports he had episode of chest tightness after taking meds, he said it was about a 3 and now just still vaguely present across his chest, denies sob, paged MD to advise

## 2018-04-08 NOTE — Discharge Summary (Signed)
Name: Francisco [name] MRN: [id number] DOB: 05-22-1946 72 y.o. PCP: [name], MD  Date of Admission: 04/07/2018 11:17 AM Date of Discharge:  Attending Physician: Lucious Groves, DO  Discharge Diagnosis: 1. Pulmonary embolism   Discharge Medications: Allergies as of 04/08/2018      Reactions   Tape Other (See Comments)   SKIN IS VERY THIN; CAN ONLY USE COBAN WRAPS DUE TO CONDITION OF SKIN!!      Medication List    STOP taking these medications   CALCIUM 1200+D3 PO   cholecalciferol 1000 units tablet Commonly known as:  VITAMIN D   doxycycline 100 MG tablet Commonly known as:  VIBRA-TABS   Fluticasone-Umeclidin-Vilant 100-62.5-25 MCG/INH Aepb     TAKE these medications   albuterol 108 (90 Base) MCG/ACT inhaler Commonly known as:  PROVENTIL HFA;VENTOLIN HFA USE 2 PUFFS EVERY 6 HOURS  AS NEEDED FOR WHEEZING What changed:    how much to take  how to take this  when to take this  reasons to take this  additional instructions   alendronate 70 MG tablet Commonly known as:  FOSAMAX Take 1 tablet (70 mg total) by mouth once a week. Take with a full glass of water on an empty stomach.   azithromycin 250 MG tablet Commonly known as:  ZITHROMAX TAKE 1 TABLET BY MOUTH ON  MONDAY, WEDNESDAY, AND  FRIDAY What changed:  See the new instructions.   CALCIUM 1200 PO Take 1,200 mg by mouth daily.   fluticasone 50 MCG/ACT nasal spray Commonly known as:  FLONASE USE 2 SPRAYS IN EACH  NOSTRIL DAILY   furosemide 20 MG tablet Commonly known as:  LASIX Take 1 tablet (20 mg total) by mouth daily as needed for edema.   HYDROcodone-acetaminophen 5-325 MG tablet Commonly known as:  NORCO/VICODIN Take 1-2 tablets by mouth every 4 (four) hours as needed for moderate pain.   OXYGEN Inhale 2 L into the lungs See admin instructions. 2 PO2 at rest and 3 PO2 with exertion. Continuous   predniSONE 10 MG tablet Commonly known as:  DELTASONE TAKE 1 TABLET BY MOUTH   DAILY WITH BREAKFAST What changed:  Another medication with the same name was changed. Make sure you understand how and when to take each.   predniSONE 10 MG tablet Commonly known as:  DELTASONE Take 50mg x3days,40mg x3days,30mg x3days,20mg x3days,then continue on 10mg  daily What changed:    how much to take  how to take this  when to take this   PULSE OXIMETER FOR FINGER Misc 1 Device by Does not apply route as needed.   Rivaroxaban 15 & 20 MG Tbpk Start with one 15mg  tablet by mouth twice a day with food. On Day 22, switch to one 20mg  tablet once a day with food.   SPIRIVA RESPIMAT 2.5 MCG/ACT Aers Generic drug:  Tiotropium Bromide Monohydrate USE 2 SPRAYS (INHALATIONS)  ONCE DAILY What changed:  See the new instructions.   SYMBICORT 160-4.5 MCG/ACT inhaler Generic drug:  budesonide-formoterol USE 2 PUFFS TWO TIMES DAILY What changed:  See the new instructions.       Disposition and follow-up:   FranciscoAveon W Tith [name] was discharged from [facility] in Stable condition.  At the hospital follow up visit please address:  1.  Patient started with Xarelto at discharge. With plan to take 15 mg BID for 3 weeks then 20 mg QD life long. Please evaluate for any bleeding.  2. Patient also had recent COPD exacerbation that treated  as outpatient with increasing prednisone dose (50 mg--> Is tapering currently) and Doxycyclin. No couph, fever or SOB at time of discharge. Will continue home meds including Azithromycin 250 mg 3 times weekly MWF as already prescribed by pulmonologist for long term.  3.  Labs / imaging needed at time of follow-up: None  4.  Pending labs/ test needing follow-up: None  Follow-up Appointments: Follow-up Information    [name], MD. Go on 04/21/2018.   Specialty:  Family Medicine Why:  @11 :15 Contact information: [place] [id number] [phone]           Hospital Course by problem list: 1. Francisco [name]  has COPD stage III by GOLD classification. Presented with worsening of SOB, increased Oxygen requirement and hypoxia. Diagnoses with PE with elevated D-dimer and Ct angio by pulmunologist and sent to ED. No right heart strain. he was hemodynamically stable on arrival but admitted due to poor functional pulmonary reserve and risk of pulmonary decompensation. Treatment started with IV Heparin and sweitched to PO Xarelto at discharge with plan to continue life long.  Discharge Vitals:   BP 139/80   Pulse 92   Temp 98.5 F (36.9 C) (Oral)   Resp 18   Ht 6' (1.829 m)   Wt 69.9 kg Comment: Scale A  SpO2 99%   BMI 20.90 kg/m   Pertinent Labs, Studies, and Procedures:  D-Dimer: 1.88 (nl<0.5) BNP: 50 (0-100) I stat Troponin: 0  CBC Latest Ref Rng & Units 04/08/2018 04/07/2018 04/03/2018  WBC 4.0 - 10.5 K/uL 9.6 9.3 9.9  Hemoglobin 13.0 - 17.0 g/dL 12.6(L) 13.4 14.0  Hematocrit 39.0 - 52.0 % 36.8(L) 40.2 40.3  Platelets 150 - 400 K/uL 212 200 236.0    EKG: personally reviewed my interpretation is normal sinus rhythm, no rt heart strain  CXR: personally reviewed my interpretation is mild pulmonary edema and increased interstitial marking and haziness at right   CT ANGIOGRAPHY CHEST WITH CONTRAST: IMPRESSION: 1. Nonocclusive pulmonary emboli centrally involving branches to the right middle lobe and right lower lobe. There is no evidence of right heart strain. No left pulmonary emboli are seen. 2. New compression deformities of T6 and T8 vertebral bodies with old compression of T7 vertebral body again noted. No retropulsion. 3. Thoracic and abdominal aortic atherosclerosis. 4. Diffuse changes of centrilobular and paraseptal emphysema.   Discharge Instructions:   Signed: Dewayne Hatch, MD 04/08/2018, 1:07 PM   Pager: [phone]

## 2018-04-08 NOTE — Progress Notes (Addendum)
   Subjective: Patient was seen and evaluated at bedside this morning. No acute events overnight. He is doing well, feeling better. Is on 3 li nasal O2, No SOB, no chest pain. no hematuria, blood in stole or nose bleeding.  Objective:  Vital signs in last 24 hours: Vitals:   04/07/18 2011 04/08/18 0031 04/08/18 0331 04/08/18 0337  BP: (!) 174/95 108/67  137/81  Pulse: 97 93  99  Resp: 19 19  19   Temp: 98.5 F (36.9 C) 98.2 F (36.8 C)  97.7 F (36.5 C)  TempSrc: Oral Oral  Oral  SpO2: 96% 97%  99%  Weight: 70 kg  69.9 kg   Height: 6' (1.829 m)        Assessment/Plan:  Principal Problem:   Pulmonary embolism (HCC) Mr. Parkison, is a 72 y/o male with PMHx. of COPD stage 3 by GOLD classification, on 2.5-3 li nasal O2 at home, Orthostatic hypotension, EtOH and tobacco use, referred by his pulmonologist to ED due to worsening of SOB, productive cough and diagnosed with PE.  Pulmonary embolism: Patient is table. Is on nasal 3.5 li nasal O2. SOB improved Elevated Heparin level. No bleeding. PLT is normal.  -No cardiac monitoring  -Stop Heparin -Discharge with po Xarelto 15 mg BID for 3 weeks and then 20 mg QD. (risk and benefits explained to the patient and his wife) -Follow up with PCP/pulmonologist  COPD (exacerbation): Improved and Stable.  No cough, no fever. No sob on nasal O2.  -Continue home inhaler and Z-pac and Prednisone PO as instructed for tapering and then continuing with 10 mg PO as before.   Dispo: Discharge today  Dewayne Hatch, MD 04/08/2018, 7:38 AM Pager: [phone]

## 2018-04-09 ENCOUNTER — Telehealth: Payer: Self-pay | Admitting: *Deleted

## 2018-04-09 ENCOUNTER — Encounter: Payer: Self-pay | Admitting: Family Medicine

## 2018-04-09 NOTE — Telephone Encounter (Signed)
Noted thanks. No urine sample unless urinary symptoms. No fasting labs required.

## 2018-04-09 NOTE — Telephone Encounter (Signed)
Called and spoke with patient. See Hospital Follow Up call.

## 2018-04-09 NOTE — Telephone Encounter (Signed)
Per chart review: Date of Admission: 04/07/2018 11:17 AM Date of Discharge:  Attending Physician: Lucious Groves, DO  Discharge Diagnosis: 1. Pulmonary embolism  _______________________________________________________________________ Per telephone call: Transition Care Management Follow-up Telephone Call   Date discharged? 04/08/18   How have you been since you were released from the hospital? "good"   Do you understand why you were in the hospital? yes   Do you understand the discharge instructions? yes   Where were you discharged to? Home   Items Reviewed:  Medications reviewed: yes  Allergies reviewed: yes  Dietary changes reviewed: yes  Referrals reviewed: yes   Functional Questionnaire:   Activities of Daily Living (ADLs):   He states they are independent in the following: ambulation, bathing and hygiene, feeding, continence, grooming, toileting and dressing States they require assistance with the following: none   Any transportation issues/concerns?: no   Any patient concerns? No. Patient would like to know if he will need a urine sample or fasting labs at his hospital follow up appt. Please advise.   Confirmed importance and date/time of follow-up visits scheduled yes  Provider Appointment booked with Dr Yong Channel 04/21/18 11:15  Confirmed with patient if condition begins to worsen call PCP or go to the ER.  Patient was given the office number and encouraged to call back with question or concerns.  : yes

## 2018-04-10 ENCOUNTER — Encounter: Payer: Medicare Other | Admitting: Rehabilitative and Restorative Service Providers"

## 2018-04-10 NOTE — Telephone Encounter (Signed)
Spoke to pt told him Dr. Yong Channel said no urine sample unless having urinary problems and no fasting labs will be required at follow up visit. Pt verbalized understanding.

## 2018-04-13 ENCOUNTER — Ambulatory Visit: Payer: Medicare Other | Admitting: Rehabilitative and Restorative Service Providers"

## 2018-04-21 ENCOUNTER — Ambulatory Visit (INDEPENDENT_AMBULATORY_CARE_PROVIDER_SITE_OTHER): Payer: Medicare Other | Admitting: Family Medicine

## 2018-04-21 ENCOUNTER — Encounter: Payer: Self-pay | Admitting: Family Medicine

## 2018-04-21 VITALS — BP 144/84 | HR 96 | Temp 98.4°F | Ht 72.0 in | Wt 158.8 lb

## 2018-04-21 DIAGNOSIS — I10 Essential (primary) hypertension: Secondary | ICD-10-CM

## 2018-04-21 DIAGNOSIS — D72829 Elevated white blood cell count, unspecified: Secondary | ICD-10-CM

## 2018-04-21 DIAGNOSIS — Z66 Do not resuscitate: Secondary | ICD-10-CM | POA: Diagnosis not present

## 2018-04-21 DIAGNOSIS — J438 Other emphysema: Secondary | ICD-10-CM

## 2018-04-21 DIAGNOSIS — S22060A Wedge compression fracture of T7-T8 vertebra, initial encounter for closed fracture: Secondary | ICD-10-CM | POA: Diagnosis not present

## 2018-04-21 DIAGNOSIS — I2782 Chronic pulmonary embolism: Secondary | ICD-10-CM | POA: Diagnosis not present

## 2018-04-21 DIAGNOSIS — J9611 Chronic respiratory failure with hypoxia: Secondary | ICD-10-CM

## 2018-04-21 DIAGNOSIS — I5189 Other ill-defined heart diseases: Secondary | ICD-10-CM | POA: Insufficient documentation

## 2018-04-21 LAB — COMPREHENSIVE METABOLIC PANEL
ALBUMIN: 4.1 g/dL (ref 3.5–5.2)
ALT: 25 U/L (ref 0–53)
AST: 21 U/L (ref 0–37)
Alkaline Phosphatase: 63 U/L (ref 39–117)
BUN: 15 mg/dL (ref 6–23)
CHLORIDE: 100 meq/L (ref 96–112)
CO2: 27 mEq/L (ref 19–32)
CREATININE: 0.98 mg/dL (ref 0.40–1.50)
Calcium: 8.9 mg/dL (ref 8.4–10.5)
GFR: 80.23 mL/min (ref >60.00)
Glucose, Bld: 99 mg/dL (ref 70–99)
POTASSIUM: 4.3 meq/L (ref 3.5–5.1)
Sodium: 137 mEq/L (ref 135–145)
Total Bilirubin: 0.5 mg/dL (ref 0.2–1.2)
Total Protein: 6.9 g/dL (ref 6.0–8.3)

## 2018-04-21 LAB — CBC
HCT: 40.4 % (ref 39.0–52.0)
Hemoglobin: 14 g/dL (ref 13.0–17.0)
MCHC: 34.5 g/dL (ref 30.0–36.0)
MCV: 96.5 fl (ref 78.0–100.0)
Platelets: 271 10*3/uL (ref 150.0–400.0)
RBC: 4.19 Mil/uL — AB (ref 4.22–5.81)
RDW: 13.3 % (ref 11.5–15.5)
WBC: 12.7 10*3/uL — ABNORMAL HIGH (ref 4.0–10.5)

## 2018-04-21 MED ORDER — FUROSEMIDE 20 MG PO TABS: 20.0000 mg | ORAL_TABLET | Freq: Every day | ORAL | 2 refills | Status: DC | PRN

## 2018-04-21 MED ORDER — RIVAROXABAN 20 MG PO TABS: 20.0000 mg | ORAL_TABLET | Freq: Every day | ORAL | 3 refills | Status: DC

## 2018-04-21 MED ORDER — TRAMADOL HCL 50 MG PO TABS: 50.0000 mg | ORAL_TABLET | Freq: Four times a day (QID) | ORAL | 0 refills | Status: DC | PRN

## 2018-04-21 NOTE — Assessment & Plan Note (Signed)
S: During hospitalization patient and wife discussed and patient stated he would not want to be intubated or have chest compressions.  Wife is his healthcare power of attorney as well as his son.  Wife and patient asked about making DNR status more formal. A/P:DNR/DNI as per discussion today. Yellow form signed-we will scan copy into the chart..  All 3 of [place] in the room were emotional about this decision but we all agreed this was then patient's best interest given the patient's declining health status and increasing number of hospitalizations recently.

## 2018-04-21 NOTE — Progress Notes (Addendum)
Subjective:  Francisco [name] is a 72 y.o. year old very pleasant male patient who presents for transitional care management and hospital follow up for pulmonary embolism. Patient was hospitalized from 04/07/18 to 04/08/18. A TCM phone call was completed on 04/09/18. Medical complexity moderate   Francisco [name] is a 72 year old male with severe COPD who presented to the emergency room with worsening shortness of breath, increased oxygen requirement and hypoxia-he was sent there by pulmonology after failure to improve with outpatient treatment for potential COPD exacerbation- he had an elevated d-dimer and ultimately found to have pulmonary embolism by CT angiogram outpatient and was sent to the emergency room.  No right heart strain was noted.  Patient was admitted due to poor functional pulmonary reserve and risk for decompensation.  Initially he was treated with IV heparin but later he was switched to p.o. Xarelto at discharge.  This was thought provoked due to sedentary activity due to severe COPD- plan was for lifelong therapy as unlikely sedentary activity will change.  Patient was started on Xarelto starter pack.  Patient is aware to take 15 mg twice a day for 3 weeks then convert to 20 mg daily for lifelong use.  Patient had recently been treated for COPD exacerbation on outpatient basis-he is currently tapering prednisone dose.  He completed his prior course of doxycycline.  He was started back on his home azithromycin before discharge Monday Wednesday Friday.  Patient does have follow-up with [name] in about 2 weeks.  On CT angiography of chest-unfortunately new compression fracture at T6 and T8 were noted.  This is in addition to the T7 compression fracture from previous.  Patient continues to have significant pain from this.  He reports to me he has used to oxycodone in a month at home.  He uses 6 total Tylenol per day.  Pain can be up to 6-7 out of 10 but with Tylenol over time will go down to a  2.  He would like something for more immediate help but does not want to depend on oxycodone.  Patient does have diastolic dysfunction as well as venous insufficiency.  He has had worsening edema and weight gain recently.  He seldomly uses Lasix.  He was considering using a course of this due to increase in edema and his weight.  Unclear why-but patient's vitamin D was stopped in the hospital.  No clear contraindication with current medications and not listed on discharge summary as to why this was done.  See problem oriented charting as well ROS- baseline SOB reported. No chest pain. Continued bilateral edema in legs- slightly worse- has had some weight gain recently as well.    Past Medical History-  Patient Active Problem List   Diagnosis Date Noted  . DNR (do not resuscitate) 04/21/2018    Priority: High  . Chronic pulmonary embolism ([place]) 04/07/2018    Priority: High  . Osteoporosis 11/19/2017    Priority: High  . Thoracic compression fracture ([place]) 11/02/2017    Priority: High  . Former smoker 08/23/2008    Priority: High  . COPD (chronic obstructive pulmonary disease) with emphysema ([place]) 08/23/2008    Priority: High  . Essential tremor 03/31/2018    Priority: Medium  . BPPV (benign paroxysmal positional vertigo), right 02/10/2018    Priority: Medium  . Essential hypertension 11/02/2017    Priority: Medium  . BPH associated with nocturia 06/25/2017    Priority: Medium  . Hyperglycemia 06/12/2016    Priority: Medium  .  Hyperlipidemia 07/22/2014    Priority: Medium  . Venous stasis dermatitis of both lower extremities 11/02/2017    Priority: Low  . Chronic respiratory failure with hypoxia ([place]) 09/23/2017    Priority: Low  . Leukocytosis 12/19/2016    Priority: Low  . Onychomycosis 07/22/2014    Priority: Low  . Left ankle swelling 07/22/2014    Priority: Low  . History of skin cancer 05/17/2014    Priority: Low  . Chronic rhinitis 10/28/2012    Priority: Low  .  Pulmonary nodule 09/23/2011    Priority: Low  . History of colonic polyps 08/23/2008    Priority: Low  . Syncope 11/02/2017    Medications- reviewed and updated  A medical reconciliation was performed comparing current medicines to hospital discharge medications. Current Outpatient Medications  Medication Sig Dispense Refill  . albuterol (VENTOLIN HFA) 108 (90 Base) MCG/ACT inhaler USE 2 PUFFS EVERY 6 HOURS  AS NEEDED FOR WHEEZING (Patient taking differently: Inhale 2 puffs into the lungs every 6 (six) hours as needed. ) 3 Inhaler 3  . alendronate (FOSAMAX) 70 MG tablet Take 1 tablet (70 mg total) by mouth once a week. Take with a full glass of water on an empty stomach. 12 tablet 3  . azithromycin (ZITHROMAX) 250 MG tablet TAKE 1 TABLET BY MOUTH ON  MONDAY, WEDNESDAY, AND  FRIDAY (Patient taking differently: Take 250 mg by mouth every Monday, Wednesday, and Friday. ) 39 tablet 1  . Calcium Carbonate-Vit D-Min (CALCIUM 1200 PO) Take 1,200 mg by mouth daily.    . cholecalciferol (VITAMIN D) 1000 units tablet Take 1,000 Units by mouth daily.    . fluticasone (FLONASE) 50 MCG/ACT nasal spray USE 2 SPRAYS IN EACH  NOSTRIL DAILY (Patient taking differently: Place 2 sprays into both nostrils daily. ) 48 g 1  . furosemide (LASIX) 20 MG tablet Take 1 tablet (20 mg total) by mouth daily as needed for fluid or edema. 30 tablet 2  . HYDROcodone-acetaminophen (NORCO/VICODIN) 5-325 MG tablet Take 1-2 tablets by mouth every 4 (four) hours as needed for moderate pain. 15 tablet 0  . Misc. Devices (PULSE OXIMETER FOR FINGER) MISC 1 Device by Does not apply route as needed. 1 each 0  . OXYGEN Inhale 2 L into the lungs See admin instructions. 2 PO2 at rest and 3 PO2 with exertion. Continuous    . predniSONE (DELTASONE) 10 MG tablet TAKE 1 TABLET BY MOUTH  DAILY WITH BREAKFAST (Patient taking differently: Take 10 mg by mouth daily with breakfast. ) 90 tablet 3  . Rivaroxaban 15 & 20 MG TBPK Start with one 15mg   tablet by mouth twice a day with food. On Day 22, switch to one 20mg  tablet once a day with food. 51 each 0  . SPIRIVA RESPIMAT 2.5 MCG/ACT AERS USE 2 SPRAYS (INHALATIONS)  ONCE DAILY (Patient taking differently: Place 2 sprays into the nose daily. ) 12 g 1  . SYMBICORT 160-4.5 MCG/ACT inhaler USE 2 PUFFS TWO TIMES DAILY (Patient taking differently: Inhale 2 puffs into the lungs 2 (two) times daily. ) 30.6 g 2  . rivaroxaban (XARELTO) 20 MG TABS tablet Take 1 tablet (20 mg total) by mouth daily with supper. 90 tablet 3  . traMADol (ULTRAM) 50 MG tablet Take 1 tablet (50 mg total) by mouth every 6 (six) hours as needed for moderate pain or severe pain. 20 tablet 0   No current facility-administered medications for this visit.     Objective: BP (!) 144/84 (  BP Location: Left Arm, Patient Position: Sitting, Cuff Size: Large)   Pulse 96   Temp 98.4 F (36.9 C) (Oral)   Ht 6' (1.829 m)   Wt 158 lb 12.8 oz (72 kg)   SpO2 93%   BMI 21.54 kg/m  Gen: appears uncomfortable due to back pain, wearing oxygen CV: RRR no murmurs rubs or gallops. Not tachycardic  Lungs: CTAB no crackles, wheeze, rhonchi Abdomen: soft/nontender/nondistended/normal bowel sounds.   Ext: 2+ edema Skin: warm, dry Neuro: speech normal, moves all extremities Tearful during DNR/DNI discussion  Assessment/Plan:   DNR (do not resuscitate) S: During hospitalization patient and wife discussed and patient stated he would not want to be intubated or have chest compressions.  Wife is his healthcare power of attorney as well as his son.  Wife and patient asked about making DNR status more formal. A/P:DNR/DNI as per discussion today. Yellow form signed-we will scan copy into the chart..  All 3 of [place] in the room were emotional about this decision but we all agreed this was then patient's best interest given the patient's declining health status and increasing number of hospitalizations recently.  COPD (chronic obstructive pulmonary  disease) with emphysema ([place]) Patient does not appear to be in current flareup.  Continue home oxygen.  Continue follow-up with [name].  He is currently on Spiriva and Symbicort until he runs out of these medications and he would be switching to Trelegy once he is done with these.  Thoracic compression fracture ([facility]) Continued pain from compression fractures.  Unfortunately he has a new one at T6 and T8 despite being on Fosamax.  Not sure why hospital team stopped his vitamin D-but he will restart his calcium and vitamin D and continue his Fosamax.  Unfortunately his pain in his back is not adequately controlled.  He wants to avoid the Vicodin he has if possible From [facility]:  " Can trial tramadol if tylenol is not effective enough but would still continue tylenol as baseline pain control. I am willing to refill this without a visit if helpful. Hoping the 20 tablets lasts at least 10-15 days.  "  Chronic respiratory failure with hypoxia [facility]) Patient is on home oxygen- has his portable system today.  Leukocytosis Leukocytosis noted on labs today-could be related to steroid use.  Will monitor.  Patient seems to be improving clinically so doubt significant bacterial infection.  Should return to care if new or worsening symptoms  Chronic pulmonary embolism ([place]) Pulmonary embolism is actually acute.  Listed as chronic today due to plan for long-term therapy with Xarelto.  I refilled patient's Xarelto for 90 days-if this is too expensive we can convert to 30 days locally.  Essential hypertension fall risk- tolerate slightly high BP today- prednisone tapering down as well may help reduce this.  I am satisfied with blood pressure in range of 1 88-4 50 systolic.  Diastolic dysfunction With worsening edema and weight gain-advised 3-day course of Lasix.  Also has known venous insufficiency.  He can repeat in the future for weight gain of 3 pounds in a day or 5 pounds in 3 days or for increased  edema.  Since he has not been formally diagnosed with heart failure with preserved ejection fraction and has not required IV diuresis-will not change this to heart failure with preserved ejection fraction.  Future Appointments  Date Time Provider [place]  04/22/2018 11:45 AM Mervyn Gay, PT [facility]  04/24/2018 10:15 AM Mervyn Gay, PT [facility]  [facility]  04/27/2018  8:45 AM Mervyn Gay, PT [facility]  04/29/2018  8:45 AM Mervyn Gay, PT [facility]  05/04/2018  9:45 AM Brand Males, MD [facility] None  05/06/2018  8:45 AM Mervyn Gay, PT [facility]  05/08/2018  8:45 AM Mervyn Gay, PT [facility]  10/01/2018  8:20 AM Yong Channel [name], MD [facility]   We discussed 3 to 59-month follow-up or sooner if needed  Lab/Order associations: Chronic pulmonary embolism without acute cor pulmonale, unspecified pulmonary embolism type ([place]) - Plan: CBC, Comprehensive metabolic panel  DNR (do not resuscitate)  Other emphysema ([place])  Compression fracture of T7 vertebra, initial encounter ([place])  Chronic respiratory failure with hypoxia ([place])  Leukocytosis, unspecified type  Essential hypertension  Meds ordered this encounter  Medications  . rivaroxaban (XARELTO) 20 MG TABS tablet    Sig: Take 1 tablet (20 mg total) by mouth daily with supper.    Dispense:  90 tablet    Refill:  3  . traMADol (ULTRAM) 50 MG tablet    Sig: Take 1 tablet (50 mg total) by mouth every 6 (six) hours as needed for moderate pain or severe pain.    Dispense:  20 tablet    Refill:  0  . furosemide (LASIX) 20 MG tablet    Sig: Take 1 tablet (20 mg total) by mouth daily as needed for fluid or edema.    Dispense:  30 tablet    Refill:  2    Return precautions advised.  Garret Reddish, MD

## 2018-04-21 NOTE — Assessment & Plan Note (Signed)
Pulmonary embolism is actually acute.  Listed as chronic today due to plan for long-term therapy with Xarelto.  I refilled patient's Xarelto for 90 days-if this is too expensive we can convert to 30 days locally.

## 2018-04-21 NOTE — Assessment & Plan Note (Signed)
With worsening edema and weight gain-advised 3-day course of Lasix.  Also has known venous insufficiency.  He can repeat in the future for weight gain of 3 pounds in a day or 5 pounds in 3 days or for increased edema.  Since he has not been formally diagnosed with heart failure with preserved ejection fraction and has not required IV diuresis-will not change this to heart failure with preserved ejection fraction.

## 2018-04-21 NOTE — Assessment & Plan Note (Signed)
Patient is on home oxygen- has his portable system today.

## 2018-04-21 NOTE — Assessment & Plan Note (Signed)
fall risk- tolerate slightly high BP today- prednisone tapering down as well may help reduce this.  I am satisfied with blood pressure in range of 1 86-7 50 systolic.

## 2018-04-21 NOTE — Assessment & Plan Note (Signed)
Leukocytosis noted on labs today-could be related to steroid use.  Will monitor.  Patient seems to be improving clinically so doubt significant bacterial infection.  Should return to care if new or worsening symptoms

## 2018-04-21 NOTE — Patient Instructions (Addendum)
Restart vitamin D. Continue calcium. Ok to have combined product.   Ok to finish symbicort and spiriva combo, then start trelegy when you run out.   Before [facility] sends 3 month supply- call them today to verify cost effective - we can change to 30 days locally if needed.   Can trial tramadol if tylenol is not effective enough but would still continue tylenol as baseline pain control. I am willing to refill this without a visit if helpful. Hoping the 20 tablets lasts at least 10-15 days.   Lets do lasix daily for next 3 days to see if helps ankle swelling  Please stop by lab before you go

## 2018-04-21 NOTE — Assessment & Plan Note (Signed)
Patient does not appear to be in current flareup.  Continue home oxygen.  Continue follow-up with [name].  He is currently on Spiriva and Symbicort until he runs out of these medications and he would be switching to Trelegy once he is done with these.

## 2018-04-21 NOTE — Assessment & Plan Note (Addendum)
Continued pain from compression fractures.  Unfortunately he has a new one at T6 and T8 despite being on Fosamax.  Not sure why hospital team stopped his vitamin D-but he will restart his calcium and vitamin D and continue his Fosamax.  Unfortunately his pain in his back is not adequately controlled.  He wants to avoid the Vicodin he has if possible From [facility]:  " Can trial tramadol if tylenol is not effective enough but would still continue tylenol as baseline pain control. I am willing to refill this without a visit if helpful. Hoping the 20 tablets lasts at least 10-15 days.  "

## 2018-04-22 ENCOUNTER — Ambulatory Visit: Payer: Medicare Other | Admitting: Rehabilitative and Restorative Service Providers"

## 2018-04-24 ENCOUNTER — Ambulatory Visit: Payer: Medicare Other | Admitting: Rehabilitative and Restorative Service Providers"

## 2018-04-27 ENCOUNTER — Ambulatory Visit: Payer: Medicare Other | Admitting: Rehabilitative and Restorative Service Providers"

## 2018-04-29 ENCOUNTER — Encounter: Payer: Medicare Other | Admitting: Rehabilitative and Restorative Service Providers"

## 2018-05-04 ENCOUNTER — Ambulatory Visit: Payer: Medicare Other | Admitting: Internal Medicine

## 2018-05-06 ENCOUNTER — Encounter: Payer: Medicare Other | Admitting: Rehabilitative and Restorative Service Providers"

## 2018-05-08 ENCOUNTER — Encounter: Payer: Medicare Other | Admitting: Rehabilitative and Restorative Service Providers"

## 2018-05-11 ENCOUNTER — Encounter: Payer: Self-pay | Admitting: Internal Medicine

## 2018-05-11 ENCOUNTER — Ambulatory Visit (INDEPENDENT_AMBULATORY_CARE_PROVIDER_SITE_OTHER): Payer: Medicare Other | Admitting: Internal Medicine

## 2018-05-11 ENCOUNTER — Telehealth: Payer: Self-pay | Admitting: Family Medicine

## 2018-05-11 VITALS — BP 126/70 | HR 100 | Ht 72.0 in | Wt 157.8 lb

## 2018-05-11 DIAGNOSIS — J9611 Chronic respiratory failure with hypoxia: Secondary | ICD-10-CM

## 2018-05-11 DIAGNOSIS — J449 Chronic obstructive pulmonary disease, unspecified: Secondary | ICD-10-CM | POA: Diagnosis not present

## 2018-05-11 DIAGNOSIS — J441 Chronic obstructive pulmonary disease with (acute) exacerbation: Secondary | ICD-10-CM

## 2018-05-11 MED ORDER — PREDNISONE 10 MG PO TABS: ORAL_TABLET | ORAL | 0 refills | Status: DC

## 2018-05-11 MED ORDER — FLUTICASONE-UMECLIDIN-VILANT 100-62.5-25 MCG/INH IN AEPB: 1.0000 | INHALATION_SPRAY | Freq: Every day | RESPIRATORY_TRACT | 1 refills | Status: DC

## 2018-05-11 MED ORDER — ROFLUMILAST 500 MCG PO TABS: 500.0000 ug | ORAL_TABLET | Freq: Every day | ORAL | 2 refills | Status: DC

## 2018-05-11 MED ORDER — ROFLUMILAST 250 MCG PO TABS: 250.0000 ug | ORAL_TABLET | Freq: Every day | ORAL | 0 refills | Status: DC

## 2018-05-11 MED ORDER — DOXYCYCLINE HYCLATE 100 MG PO TABS: 100.0000 mg | ORAL_TABLET | Freq: Two times a day (BID) | ORAL | 0 refills | Status: DC

## 2018-05-11 NOTE — Addendum Note (Signed)
Addended by: Karmen Stabs on: 05/11/2018 12:03 PM   Modules accepted: Orders

## 2018-05-11 NOTE — Progress Notes (Signed)
ROV 12/30/16 -- patient has a history of tobacco use, COPD currently maintained on chronic prednisone, Symbicort, Spiriva. Most recent blurry function testing was March 2011 with an FEV1 of 1.2 L (34% Pred), severe obstruction. He up titrates his prednisone on his own based on symptoms. No abx since last time. He has increased pred temporrily about 4 -5 x since last time. No increases for over a month. He is smoking a pipe, not cigarettes. He is using HCTZ more frequently these days. He is on flonase and singulair. He does note that he has some slow progression of his DOE. He coughs a few times a day, clears clear sputum.   rov 07/17/17 --this is a follow-up visit for patient with active tobacco use, severe COPD and chronic prednisone use.  He often titrates his prednisone on his own depending on how he is feeling. He complains of a new pain in his back below his left shoulder blade, has been present for the last 10 days. sometimes pleuritic, can be stabbing, worse w cough. His pred use: on 10mg  for the last 19 days. No hx VTE. He has LE edema, venous stasis changes, increased since his diuretics decreased.   ROV 09/23/17 --72 year old man with a history of tobacco use (still smokes pipe), severe COPD and severe obstruction on spirometry.  At his last visit he had some pleuritic left back pain and I performed a CT PA that I have reviewed from 07/17/17.  There was no pulmonary embolism but extensive emphysema present, some bilateral apical scar more so on the right, stable.  He uses prednisone 10 mg daily and uptitrate depending on his day-to-day symptoms.  He is otherwise managed on Symbicort and Spiriva.  Desaturated on arrival today after ambulating to the exam room. He reports more dyspnea, has increased pred intermittently.    OV 12/05/2017  Chief Complaint  Patient presents with  . Follow-up    Switching from RB to MR.   Pt is on 3L with exertion and 2L at rest. Pt states his breathing had  become worse but since he has been on O2 since 3/21, it has really helped with his breathing.  Pt was in hospital 4/28-5/3. Other than SOB, pt does have c/o cough, rattling in chest. Denies any CP/chest tightness.   History from patient, his wife and review of the old chart  Francisco Coventry IIIs a transfer of care from [name] to myself [name].  His son is my patient and therefore he is done this transfer of care.  According to the patient he is to be seen by [name] for COPD.  Patient is FEV1 34% based on March 2011 PFTs according to chart review.  At baseline he is maintained on Spiriva, Symbicort, Singulair, chronic daily prednisone 10 mg/day for at least 3 years, and 3 times a week of azithromycin for 2 years.  Earlier this year in March 2019 he went on daytime oxygen and following a recent hospitalization April-May 2019.  For syncope that was believed due to nocturnal hypoxemia he was started on night oxygen.  Since starting oxygen his edema has improved and his overall quality of life is improved and he stopped having any orthostasis or presyncopal episodes.  He is grateful for the fact that he is on oxygen.  His current COPD CAT score and severity of symptoms is rated below and is 26.  Show significant amount of symptom burden.  His main goal is to improve  his quality of life.  He had his wife have many questions centering around quality of life, medication therapy.  Of note he has had some thoracic vertebral fractures that are new.  This happened after the fall in late April 2019.  Discovered at admission last month.  He feels is due to prednisone.  He is wondering about portable oxygen.   OV 01/29/2018  Chief Complaint  Patient presents with  . Follow-up    Pt states he has been doing okay since last visit. States breathing is about the same, has an occ cough but states the O2 has helped out a lot with breathing and cough. Denies any complaints of CP.   Francisco [name] , 72 y.o. ,  with dob 03/31/1946 and male ,Not Hispanic or Latino from [place] [address] - presents to lung clinic for advanced COPD follow-up. Since his last visit his symptoms course of improvement. Score is 26 and severe symptoms of document below. He is on Spiriva, Symbicort, continues oxygen, daily prednisone and azithromycin 3 times a week. He has stopped his singulair without any problems. Overall he says he is better. He is willing to give TRELEGY inhaler trial. Last visit to check blood gas and he does not have hypercapnia so he does not qualify for BiPAP        OV 04/03/2018  Subjective:  Patient ID: Francisco [name], male , DOB: 10/18/1945 , age 72 y.o. , MRN: [id number] , ADDRESS: [place] [id number]   04/03/2018 -   Chief Complaint  Patient presents with  . Acute Visit    Pt's O2 sats have been dropping into the 70s with little exertion.  Pt took O2 off just to shave 9/26 and after he finished shaving put the O2 back on, walked from the bathroom down to the living room on O2 and sats were at 77% on 3L 9/26. Pt also has c/o cough with white to yellow mucus and chest tightness with the SOB.     HPI Francisco [name] 72 y.o. -acute visit for this patient.  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  He is chronically prednisone dependent.  He also is on schedule azithromycin.  He called in yesterday feeling unwell therefore we asked him to come in today.  He tells me that approximately a week ago he started having increased cough and congestion.  Yesterday primary care physician following a routine visit thought he was in COPD exacerbation and started him on doxycycline.  He also personally bumped up his baseline prednisone.  He does me that yesterday while he was shaving on room air he felt more short of breath than usual.  Also when he walked he desaturated more than usual therefore he decided to call in and come in today.   There is no fever or chills or hemoptysis or colored sputum.  No leg edema.  No orthopnea.  He does not feel like he needs to be in the emergency department or get admitted.   OV 05/11/2018  Subjective:  Patient ID: Francisco [name], male , DOB: Jul 28, 1945 , age 72 y.o. , MRN: [id number] , ADDRESS: [place] [id number]   05/11/2018 -   Chief Complaint  Patient presents with  . Follow-up    pt states breathing has wrosen since last OV. increased sob, occ chest tightness, prod cough with yellow mucus &  chest congestion. currently taking trelegy samples. recent admission 04/07/18 for PE.     HPI Francisco [name] 72 y.o. -advanced COPD with chronic hypoxemic respiratory failure [not a BiPAP candidate because of lack of hypercapnia] presents with his wife for follow-up.  After the last visit he continued to have symptoms so on April 07, 2018 he got an outpatient CT scan and he had pulmonary embolism.  He got admitted to the hospital and then discharged and since then has been on Xarelto.  He continues his baseline 2-3 L of nasal cannula oxygen but he says since then he is more symptomatic.  Is also corresponds with him starting Trelegy inhaler and also for the last few to several weeks is having increased cough and congestion and yellow phlegm.  His COPD CAT score is deteriorated to 35 and above.  He is very frustrated by his symptoms.  Review of his medication shows that he takes azithromycin and chronic prednisone and Trelegy and oxygen for his COPD.  He is on Xarelto for his blood clots.  He is also on Fosamax for prednisone dependent osteoporosis management.  His smoking is in remission.  Wife says he is always sedentary.  Apparently they tried to do some physical therapy a month ago but he desaturated.  He does not seem to keen on physical therapy right now    CAT COPD Symptom & Quality of Life Score (GSK trademark) 0 is no burden. 5 is highest burden 12/05/2017  01/29/2018   05/11/2018   Never Cough -> Cough all the time 3 1 5   No phlegm in chest -> Chest is full of phlegm 2 2 4   No chest tightness -> Chest feels very tight 1 0 3  No dyspnea for 1 flight stairs/hill -> Very dyspneic for 1 flight of stairs 5 4 5   No limitations for ADL at home -> Very limited with ADL at home 5 5 5   Confident leaving home -> Not at all confident leaving home 3 3 3   Sleep soundly -> Do not sleep soundly because of lung condition 3 1 2   Lots of Energy -> No energy at all 4 4 5   TOTAL Score (max 40)  26 20 37       ROS - per HPI     has a past medical history of Chest x-ray abnormality, Chronic back pain, Chronic rhinitis, Compressed spine fracture (HCC), COPD (chronic obstructive pulmonary disease) ([place]), Emphysema lung ([place]), On home oxygen therapy, Onychomycosis, Orthostatic hypotension, Pneumonia, Pulmonary embolism ([place]) (04/07/2018), Skin cancer, and Vertigo.   reports that he has quit smoking. His smoking use included pipe and cigarettes. He started smoking about 7 months ago. He has a 102.00 pack-year smoking history. He has never used smokeless tobacco.  Past Surgical History:  Procedure Laterality Date  . APPENDECTOMY    . SKIN CANCER EXCISION     "lips, face, ears, arms" (04/07/2018)  . TONSILLECTOMY      Allergies  Allergen Reactions  . Tape Other (See Comments)    SKIN IS VERY THIN; CAN ONLY USE COBAN WRAPS DUE TO CONDITION OF SKIN!!    Immunization History  Administered Date(s) Administered  . Influenza Split 04/24/2012  . Influenza Whole 05/08/2009, 04/08/2011  . Influenza, High Dose Seasonal PF 05/01/2017, 03/31/2018  . Influenza,inj,Quad PF,6+ Mos 04/27/2013, 04/15/2014, 04/24/2015  . Influenza-Unspecified 05/13/2016  . Pneumococcal Conjugate-13 02/25/2014  . Pneumococcal Polysaccharide-23 08/15/2011, 12/19/2016  . Pneumococcal-Unspecified 03/08/2017  . Td 02/21/2010  . Tdap  11/05/2017  . Zoster 01/29/2012    Family History  Problem  Relation Age of Onset  . Heart disease Mother        CABG in her 64s, nonsmoker  . Cancer Mother   . Stroke Father   . Heart disease Father        Died of MI at age 58, smoker  . Hepatitis Sister   . Coronary artery disease Other        male 1st degree relative <60     Current Outpatient Medications:  .  albuterol (VENTOLIN HFA) 108 (90 Base) MCG/ACT inhaler, USE 2 PUFFS EVERY 6 HOURS  AS NEEDED FOR WHEEZING (Patient taking differently: Inhale 2 puffs into the lungs every 6 (six) hours as needed. ), Disp: 3 Inhaler, Rfl: 3 .  alendronate (FOSAMAX) 70 MG tablet, Take 1 tablet (70 mg total) by mouth once a week. Take with a full glass of water on an empty stomach., Disp: 12 tablet, Rfl: 3 .  azithromycin (ZITHROMAX) 250 MG tablet, TAKE 1 TABLET BY MOUTH ON  MONDAY, WEDNESDAY, AND  FRIDAY (Patient taking differently: Take 250 mg by mouth every Monday, Wednesday, and Friday. ), Disp: 39 tablet, Rfl: 1 .  Calcium Carbonate-Vit D-Min (CALCIUM 1200 PO), Take 1,200 mg by mouth daily., Disp: , Rfl:  .  cholecalciferol (VITAMIN D) 1000 units tablet, Take 1,000 Units by mouth daily., Disp: , Rfl:  .  fluticasone (FLONASE) 50 MCG/ACT nasal spray, USE 2 SPRAYS IN EACH  NOSTRIL DAILY (Patient taking differently: Place 2 sprays into both nostrils daily. ), Disp: 48 g, Rfl: 1 .  furosemide (LASIX) 20 MG tablet, Take 1 tablet (20 mg total) by mouth daily as needed for fluid or edema., Disp: 30 tablet, Rfl: 2 .  HYDROcodone-acetaminophen (NORCO/VICODIN) 5-325 MG tablet, Take 1-2 tablets by mouth every 4 (four) hours as needed for moderate pain., Disp: 15 tablet, Rfl: 0 .  Misc. Devices (PULSE OXIMETER FOR FINGER) MISC, 1 Device by Does not apply route as needed., Disp: 1 each, Rfl: 0 .  OXYGEN, Inhale 2 L into the lungs See admin instructions. 2 PO2 at rest and 3 PO2 with exertion. Continuous, Disp: , Rfl:  .  predniSONE (DELTASONE) 10 MG tablet, TAKE 1 TABLET BY MOUTH  DAILY WITH BREAKFAST (Patient taking  differently: Take 10 mg by mouth daily with breakfast. ), Disp: 90 tablet, Rfl: 3 .  rivaroxaban (XARELTO) 20 MG TABS tablet, Take 1 tablet (20 mg total) by mouth daily with supper., Disp: 90 tablet, Rfl: 3 .  traMADol (ULTRAM) 50 MG tablet, Take 1 tablet (50 mg total) by mouth every 6 (six) hours as needed for moderate pain or severe pain., Disp: 20 tablet, Rfl: 0 .  SPIRIVA RESPIMAT 2.5 MCG/ACT AERS, USE 2 SPRAYS (INHALATIONS)  ONCE DAILY (Patient not taking: No sig reported), Disp: 12 g, Rfl: 1 .  SYMBICORT 160-4.5 MCG/ACT inhaler, USE 2 PUFFS TWO TIMES DAILY (Patient not taking: No sig reported), Disp: 30.6 g, Rfl: 2      Objective:   Vitals:   05/11/18 1104  BP: 126/70  Pulse: 100  SpO2: 97%  Weight: 157 lb 12.8 oz (71.6 kg)  Height: 6' (1.829 m)    Estimated body mass index is 21.4 kg/m as calculated from the following:   Height as of this encounter: 6' (1.829 m).   Weight as of this encounter: 157 lb 12.8 oz (71.6 kg).  @WEIGHTCHANGE @  Filed Weights   05/11/18 1104  Weight:  157 lb 12.8 oz (71.6 kg)     Physical Exam  General Appearance:    Alert, cooperative, no distress, appears stated age - older , Deconditioned looking - yes , OBESE  - no, Sitting on Wheelchair -  no  Head:    Normocephalic, without obvious abnormality, atraumatic  Eyes:    PERRL, conjunctiva/corneas clear,  Ears:    Normal TM's and external ear canals, both ears  Nose:   Nares normal, septum midline, mucosa normal, no drainage    or sinus tenderness. OXYGEN ON  - yes . Patient is @ 3L   Throat:   Lips, mucosa, and tongue normal; teeth and gums normal. Cyanosis on lips - no  Neck:   Supple, symmetrical, trachea midline, no adenopathy;    thyroid:  no enlargement/tenderness/nodules; no carotid   bruit or JVD  Back:     Symmetric, no curvature, ROM normal, no CVA tenderness  Lungs:     Distress - no , Wheeze no, Barrell Chest - yes, Purse lip breathing - ye, Crackles - no   Chest Wall:    No  tenderness or deformity.    Heart:    Regular rate and rhyth  m, S1 and S2 normal, no rub   or gallop, Murmur - no  Breast Exam:    NOT DONE  Abdomen:     Soft, non-tender, bowel sounds active all four quadrants,    no masses, no organomegaly. Visceral obesity - no  Genitalia:   NOT DONE  Rectal:   NOT DONE  Extremities:   Extremities - normal, Has Cane - no, Clubbing - no, Edema - no  Pulses:   2+ and symmetric all extremities  Skin:   Stigmata of Connective Tissue Disease - no  Lymph nodes:   Cervical, supraclavicular, and axillary nodes normal  Psychiatric:  Neurologic:   Pleasant - yes, Anxious - no, Flat affect - mild yes  CAm-ICU - neg, Alert and Oriented x 3 - yes, Moves all 4s - yes, Speech - normal, Cognition - intact           Assessment:       ICD-10-CM   1. COPD with acute exacerbation ([place]) J44.1   2. Chronic respiratory failure with hypoxia (HCC) J96.11   3. Stage 4 very severe COPD by GOLD classification ([place]) J44.9        Plan:     Patient Instructions     ICD-10-CM   1. COPD with acute exacerbation ([facility]) J44.1   2. Chronic respiratory failure with hypoxia (HCC) J96.11   3. Stage 4 very severe COPD by GOLD classification ([place]) J44.9     Symptoms worse than baseline probably because of COPD exacerbation going on for a few weeks  Possible deconditioning  Possible residual effects from pulmonary embolism suffered early October 2019   Plan Take prednisone 40 mg daily x 3 days, then 20mg  daily x 3 days, then 10mg  daily to continue  Take doxycycline 100mg  po twice daily x 5 days; take after meals and avoid sunlight  ContinuTRELEGY- daily - take 2-3 samples  Continue o2 24/h  Continue xarelto for blood clot  Call PCP [name], MD and change fosfamax to something else that wont give you acid reflux  Continue azithromycin  Start  new tablet called roflumilast 1 tablet  To prevent flare ups  - start only after stopping fosfamax  -  starter pack 270mcg per day after food x 30 days or 1  month  - then escalate to 556mcg daily after food x continue at this dose  - any side effects especially GI call us  Followup 4-6 weeks with APP or myself; if still very symptomatic can try YUPELRI or neb based Rx for COPD      > 50% of this > 25 min visit spent in face to face counseling or coordination of care - by this undersigned MD - Dr Brand Males. This includes one or more of the following documented above: discussion of test results, diagnostic or treatment recommendations, prognosis, risks and benefits of management options, instructions, education, compliance or risk-factor reduction   SIGNATURE    Dr. Brand Males, M.D., F.C.C.P,  Pulmonary and Critical Care Medicine Staff Physician, [place] Director - Interstitial Lung Disease  Program  Pulmonary [place] at [place], [place], [address]  Pager: [phone], If no answer or between  15:00h - 7:00h: call [phone] Telephone: [phone]  11:40 AM 05/11/2018

## 2018-05-11 NOTE — Telephone Encounter (Signed)
Copied from [place] [phone]. Topic: General - Other >> May 11, 2018  2:01 PM Lennox Solders wrote: Reason for CRM: pt saw dr [name] today 05-11-18 . Dr [name] would like dr hunter to change flomax to something else due to acid reflux. Pt would like dr Retail banker nurse jamie to return his call

## 2018-05-11 NOTE — Telephone Encounter (Signed)
See note

## 2018-05-11 NOTE — Patient Instructions (Addendum)
ICD-10-CM   1. COPD with acute exacerbation ([facility]) J44.1   2. Chronic respiratory failure with hypoxia (HCC) J96.11   3. Stage 4 very severe COPD by GOLD classification ([place]) J44.9     Symptoms worse than baseline probably because of COPD exacerbation going on for a few weeks  Possible deconditioning  Possible residual effects from pulmonary embolism suffered early October 2019   Plan Take prednisone 40 mg daily x 3 days, then 20mg  daily x 3 days, then 10mg  daily to continue  Take doxycycline 100mg  po twice daily x 5 days; take after meals and avoid sunlight  ContinuTRELEGY- daily - take 2-3 samples  Continue o2 24/h  Continue xarelto for blood clot  Call PCP [name], MD and change fosfamax to something else that wont give you acid reflux  Continue azithromycin  Start  new tablet called roflumilast 1 tablet  To prevent flare ups  - start only after stopping fosfamax  - starter pack 243mcg per day after food x 30 days or 1 month  - then escalate to 541mcg daily after food x continue at this dose  - any side effects especially GI call us  Followup 4-6 weeks with APP or myself; if still very symptomatic can try YUPELRI or neb based Rx for COPD

## 2018-05-12 ENCOUNTER — Encounter: Payer: Self-pay | Admitting: Family Medicine

## 2018-05-12 ENCOUNTER — Telehealth: Payer: Self-pay | Admitting: Internal Medicine

## 2018-05-12 NOTE — Telephone Encounter (Signed)
Im speculating but I wonder if [name] wants Francisco [name] to stop the fosamax  in case reflux is contributing to cough symptoms.   I am going to send [name] this message for his opinion.   This close to a fracture would want him on a bisphosphonate. Other option would be reclast infusion potentially if [name] strongly prefers that.

## 2018-05-12 NOTE — Telephone Encounter (Signed)
Medication name and strength: Daliresp 288mcg tablets Provider: [name] Pharmacy: [facility] on [address]  Patient insurance ID: [id number] Phone: [phone] Fax: [phone]  Was the PA started on CMM?  Yes If yes, please enter the Key: [id number] Timeframe for approval/denial: 48-72 hrs  PA was automatically approved. It is approved until 07/08/2019. Pharmacy is aware. Will close this encounter.

## 2018-05-12 NOTE — Telephone Encounter (Signed)
Called patient and patient states that he does not have acid reflux. Patient is confused. He feels [name] misunderstood him when he was asked of he had any GI issues. He told him that he occasionally has diarrhea. [name] told him then he needed to change medication due to reflux. Patient is asking you to review Dr. Golden Pop note about changing his medication from Flomax to a different medication. Please advise

## 2018-05-13 NOTE — Telephone Encounter (Signed)
Hi Dr Yong Channel  So, I have wanted to start roflumilast to prevent aecopd recurrence. This has GI side effects  And I Worry the presence of fosfamax (not flomax) can accentuate it. So wondering if you can consider another alternative for his bone health   Thanks    SIGNATURE    Dr. Brand Males, M.D., F.C.C.P,  Pulmonary and Critical Care Medicine Staff Physician, [place] Director - Interstitial Lung Disease  Program  Pulmonary [place] at [place], [place], [address]  Pager: [phone], If no answer or between  15:00h - 7:00h: call [phone] Telephone: [phone]  8:58 AM 05/13/2018

## 2018-05-13 NOTE — Telephone Encounter (Signed)
Team-can you check with patient and see if he would be willing to consider Reclast infusions 5 mg once a year.  If he would prefer to come in and sit down to discuss we can do that as well.  He would need to continue his calcium and vitamin D with this.  The infusion form of this medication does not cause stomach effects like Fosamax - this would allow [name] to consider other therapies which may benefit his lungs.

## 2018-05-13 NOTE — Telephone Encounter (Signed)
Called and spoke to patient. I am scheduling him for Monday at 3:00 to get all of his questions answered.

## 2018-05-18 ENCOUNTER — Ambulatory Visit (INDEPENDENT_AMBULATORY_CARE_PROVIDER_SITE_OTHER): Payer: Medicare Other | Admitting: Family Medicine

## 2018-05-18 ENCOUNTER — Encounter: Payer: Self-pay | Admitting: Family Medicine

## 2018-05-18 DIAGNOSIS — M8000XA Age-related osteoporosis with current pathological fracture, unspecified site, initial encounter for fracture: Secondary | ICD-10-CM

## 2018-05-18 DIAGNOSIS — J438 Other emphysema: Secondary | ICD-10-CM

## 2018-05-18 MED ORDER — BUDESONIDE-FORMOTEROL FUMARATE 160-4.5 MCG/ACT IN AERO: INHALATION_SPRAY | RESPIRATORY_TRACT | 2 refills | Status: DC

## 2018-05-18 NOTE — Progress Notes (Signed)
Subjective:  Francisco [name] is a 72 y.o. year old very pleasant male patient who presents for/with See problem oriented charting ROS- no chest pain. Stable shortness of breath. Some cough. Swelling - still some but improved from last visit.    Past Medical History-  Patient Active Problem List   Diagnosis Date Noted  . DNR (do not resuscitate) 04/21/2018    Priority: High  . Chronic pulmonary embolism ([place]) 04/07/2018    Priority: High  . Osteoporosis 11/19/2017    Priority: High  . Thoracic compression fracture ([place]) 11/02/2017    Priority: High  . Former smoker 08/23/2008    Priority: High  . 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 Essential tremor 03/31/2018    Priority: Medium  . BPPV (benign paroxysmal positional vertigo), right 02/10/2018    Priority: Medium  . Essential hypertension 11/02/2017    Priority: Medium  . BPH associated with nocturia 06/25/2017    Priority: Medium  . Hyperglycemia 06/12/2016    Priority: Medium  . Hyperlipidemia 07/22/2014    Priority: Medium  . Venous stasis dermatitis of both lower extremities 11/02/2017    Priority: Low  . Chronic respiratory failure with hypoxia ([place]) 09/23/2017    Priority: Low  . Leukocytosis 12/19/2016    Priority: Low  . Onychomycosis 07/22/2014    Priority: Low  . Left ankle swelling 07/22/2014    Priority: Low  . History of skin cancer 05/17/2014    Priority: Low  . Chronic rhinitis 10/28/2012    Priority: Low  . Pulmonary nodule 09/23/2011    Priority: Low  . History of colonic polyps 08/23/2008    Priority: Low  . Diastolic dysfunction 71/24/5809  . Syncope 11/02/2017    Medications- reviewed and updated Current Outpatient Medications  Medication Sig Dispense Refill  . albuterol (VENTOLIN HFA) 108 (90 Base) MCG/ACT inhaler USE 2 PUFFS EVERY 6 HOURS  AS NEEDED FOR WHEEZING (Patient taking differently: Inhale 2 puffs into the lungs every 6  (six) hours as needed. ) 3 Inhaler 3  . alendronate (FOSAMAX) 70 MG tablet Take 1 tablet (70 mg total) by mouth once a week. Take with a full glass of water on an empty stomach. 12 tablet 3  . azithromycin (ZITHROMAX) 250 MG tablet TAKE 1 TABLET BY MOUTH ON  MONDAY, WEDNESDAY, AND  FRIDAY (Patient taking differently: Take 250 mg by mouth every Monday, Wednesday, and Friday. ) 39 tablet 1  . Calcium Carbonate-Vit D-Min (CALCIUM 1200 PO) Take 1,200 mg by mouth daily.    . cholecalciferol (VITAMIN D) 1000 units tablet Take 1,000 Units by mouth daily.    . fluticasone (FLONASE) 50 MCG/ACT nasal spray USE 2 SPRAYS IN EACH  NOSTRIL DAILY (Patient taking differently: Place 2 sprays into both nostrils daily. ) 48 g 1  . Fluticasone-Umeclidin-Vilant (TRELEGY ELLIPTA) 100-62.5-25 MCG/INH AEPB Inhale 1 puff into the lungs daily. 28 each 1  . furosemide (LASIX) 20 MG tablet Take 1 tablet (20 mg total) by mouth daily as needed for fluid or edema. 30 tablet 2  . HYDROcodone-acetaminophen (NORCO/VICODIN) 5-325 MG tablet Take 1-2 tablets by mouth every 4 (four) hours as needed for moderate pain. 15 tablet 0  . Misc. Devices (PULSE OXIMETER FOR FINGER) MISC 1 Device by Does not apply route as needed. 1 each 0  . OXYGEN Inhale 2 L into the lungs See admin instructions. 2 PO2 at rest and 3 PO2 with exertion. Continuous    .  predniSONE (DELTASONE) 10 MG tablet TAKE 1 TABLET BY MOUTH  DAILY WITH BREAKFAST (Patient taking differently: Take 10 mg by mouth daily with breakfast. ) 90 tablet 3  . rivaroxaban (XARELTO) 20 MG TABS tablet Take 1 tablet (20 mg total) by mouth daily with supper. 90 tablet 3  . SPIRIVA RESPIMAT 2.5 MCG/ACT AERS USE 2 SPRAYS (INHALATIONS)  ONCE DAILY 12 g 1  . SYMBICORT 160-4.5 MCG/ACT inhaler USE 2 PUFFS TWO TIMES DAILY 30.6 g 2  . traMADol (ULTRAM) 50 MG tablet Take 1 tablet (50 mg total) by mouth every 6 (six) hours as needed for moderate pain or severe pain. 20 tablet 0  . Roflumilast (DALIRESP)  250 MCG TABS Take 250 mcg by mouth daily. (Patient not taking: Reported on 05/18/2018) 30 tablet 0  . roflumilast (DALIRESP) 500 MCG TABS tablet Take 1 tablet (500 mcg total) by mouth daily. (Patient not taking: Reported on 05/18/2018) 90 tablet 2   Objective: BP 136/76   Pulse 93   Temp 98.5 F (36.9 C) (Oral)   Ht 6' (1.829 m)   Wt 157 lb 12.8 oz (71.6 kg)   SpO2 94%   BMI 21.40 kg/m  Gen: NAD, resting comfortably CV: RRR no murmurs rubs or gallops Lungs: CTAB no crackles, wheeze, rhonchi Abdomen: soft/nontender/nondistended Ext: 1+ edema Skin: warm, dry Wearing oxygen today- portable  Assessment/Plan:  COPD (chronic obstructive pulmonary disease) with emphysema ([facility]) Osteoporosis S: patient came in today - I believed this visit was to walk him through changing from fosamax to reclast so he could be placed on Daliresp.   Patient informs me that he is not yet confident in this change. He does not feel he has done well on trelegy instead of Symbicort and Spiriva- it seems the more frequent dosing gives him more confidence in efficacy. He is not opposed to trying th Daliresp/trelegy/reclast combination- but he wants to be more confident that prior regimen wouldn't work- it was less expensive and he feels he had fewer flare ups (about every 90 days) A/P: I was honest with patient that I thought he would ultimately need Daliresp/trelegy/reclast combination but honestly if being on symbicort/spiriva/fosamax combo increases his confidence prior to this change that it was reasonable. I refilled his symbicort for him and will message [name] to let him know about patient preference. I am prepared to change him to reclast if/when this combo is not effective enough.   Osteoporosis Patient agrees to change to Reclast from Fosamax if he does not do well back on Symbicort and Spiriva so that Daliresp can be added.   Future Appointments  Date Time Provider [place]  06/10/2018   9:30 AM Brand Males, MD [facility] None  10/01/2018  8:20 AM Yong Channel [name], MD [facility]    Meds ordered this encounter  Medications  . budesonide-formoterol (SYMBICORT) 160-4.5 MCG/ACT inhaler    Sig: USE 2 PUFFS TWO TIMES DAILY    Dispense:  30.6 g    Refill:  2    Return precautions advised.  Garret Reddish, MD

## 2018-05-18 NOTE — Assessment & Plan Note (Signed)
Patient agrees to change to Reclast from Fosamax if he does not do well back on Symbicort and Spiriva so that Daliresp can be added.

## 2018-05-18 NOTE — Assessment & Plan Note (Signed)
Osteoporosis S: patient came in today - I believed this visit was to walk him through changing from fosamax to reclast so he could be placed on Daliresp.   Patient informs me that he is not yet confident in this change. He does not feel he has done well on trelegy instead of Symbicort and Spiriva- it seems the more frequent dosing gives him more confidence in efficacy. He is not opposed to trying th Daliresp/trelegy/reclast combination- but he wants to be more confident that prior regimen wouldn't work- it was less expensive and he feels he had fewer flare ups (about every 90 days) A/P: I was honest with patient that I thought he would ultimately need Daliresp/trelegy/reclast combination but honestly if being on symbicort/spiriva/fosamax combo increases his confidence prior to this change that it was reasonable. I refilled his symbicort for him and will message [name] to let him know about patient preference. I am prepared to change him to reclast if/when this combo is not effective enough.

## 2018-05-18 NOTE — Patient Instructions (Addendum)
Please stop the trelegy  Start back on Symbicort and Spiriva  I understand your concern about remaining on trelegy and adding Daliresp which would also require [place] to switch to Reclast when you feel like you did better on the Symbicort and Spiriva.  I will reach out to [name] to express your concern and tell him what our plan from today was.  I do suspect we have will eventually need to use the Daliresp and Reclast and trelegy combination but we need to increase your confidence in this change by trialing the old regimen first.

## 2018-05-28 ENCOUNTER — Encounter: Payer: Self-pay | Admitting: Family Medicine

## 2018-05-28 NOTE — Telephone Encounter (Signed)
Dr Yong Channel, I called and spoke with patient. He states he cannot come in today and will call if he can come in tomorrow. I explained that Dr Yong Channel prefers to see patients in office prior to sending in antibiotics. Patient stated understanding and will call back to schedule tomorrow.

## 2018-05-29 ENCOUNTER — Telehealth: Payer: Self-pay

## 2018-05-29 MED ORDER — DOXYCYCLINE HYCLATE 100 MG PO TABS: 100.0000 mg | ORAL_TABLET | Freq: Two times a day (BID) | ORAL | 0 refills | Status: AC

## 2018-05-29 NOTE — Telephone Encounter (Signed)
Called and spoke with patient in regards to My Chart message and sinus infection. He was wandering of something could be called in for him. He stated if he needed to come in he would wait until it was much worse before coming. Please advise

## 2018-05-29 NOTE — Addendum Note (Signed)
Addended by: [name] on: 05/29/2018 05:53 PM   Modules accepted: Orders

## 2018-05-29 NOTE — Telephone Encounter (Signed)
See my chart message

## 2018-06-10 ENCOUNTER — Encounter: Payer: Self-pay | Admitting: Internal Medicine

## 2018-06-10 ENCOUNTER — Ambulatory Visit (INDEPENDENT_AMBULATORY_CARE_PROVIDER_SITE_OTHER): Payer: Medicare Other | Admitting: Internal Medicine

## 2018-06-10 VITALS — BP 138/80 | HR 108 | Ht 71.0 in | Wt 162.8 lb

## 2018-06-10 DIAGNOSIS — J9611 Chronic respiratory failure with hypoxia: Secondary | ICD-10-CM

## 2018-06-10 DIAGNOSIS — J441 Chronic obstructive pulmonary disease with (acute) exacerbation: Secondary | ICD-10-CM | POA: Diagnosis not present

## 2018-06-10 DIAGNOSIS — J449 Chronic obstructive pulmonary disease, unspecified: Secondary | ICD-10-CM

## 2018-06-10 MED ORDER — ROFLUMILAST 500 MCG PO TABS: 500.0000 ug | ORAL_TABLET | Freq: Every day | ORAL | 2 refills | Status: DC

## 2018-06-10 MED ORDER — ROFLUMILAST 250 MCG PO TABS: 250.0000 ug | ORAL_TABLET | Freq: Every day | ORAL | 0 refills | Status: DC

## 2018-06-10 NOTE — Addendum Note (Signed)
Addended by: Della Goo C on: 06/10/2018 10:10 AM   Modules accepted: Orders

## 2018-06-10 NOTE — Progress Notes (Signed)
ROV 12/30/16 -- patient has a history of tobacco use, COPD currently maintained on chronic prednisone, Symbicort, Spiriva. Most recent blurry function testing was March 2011 with an FEV1 of 1.2 L (34% Pred), severe obstruction. He up titrates his prednisone on his own based on symptoms. No abx since last time. He has increased pred temporrily about 4 -5 x since last time. No increases for over a month. He is smoking a pipe, not cigarettes. He is using HCTZ more frequently these days. He is on flonase and singulair. He does note that he has some slow progression of his DOE. He coughs a few times a day, clears clear sputum.   rov 07/17/17 --this is a follow-up visit for patient with active tobacco use, severe COPD and chronic prednisone use.  He often titrates his prednisone on his own depending on how he is feeling. He complains of a new pain in his back below his left shoulder blade, has been present for the last 10 days. sometimes pleuritic, can be stabbing, worse w cough. His pred use: on 10mg  for the last 19 days. No hx VTE. He has LE edema, venous stasis changes, increased since his diuretics decreased.   ROV 09/23/17 --72 year old man with a history of tobacco use (still smokes pipe), severe COPD and severe obstruction on spirometry.  At his last visit he had some pleuritic left back pain and I performed a CT PA that I have reviewed from 07/17/17.  There was no pulmonary embolism but extensive emphysema present, some bilateral apical scar more so on the right, stable.  He uses prednisone 10 mg daily and uptitrate depending on his day-to-day symptoms.  He is otherwise managed on Symbicort and Spiriva.  Desaturated on arrival today after ambulating to the exam room. He reports more dyspnea, has increased pred intermittently.    OV 12/05/2017  Chief Complaint  Patient presents with  . Follow-up    Switching from RB to MR.   Pt is on 3L with exertion and 2L at rest. Pt states his breathing had  become worse but since he has been on O2 since 3/21, it has really helped with his breathing.  Pt was in hospital 4/28-5/3. Other than SOB, pt does have c/o cough, rattling in chest. Denies any CP/chest tightness.   History from patient, his wife and review of the old chart  Talmadge Coventry IIIs a transfer of care from [name] to myself [name].  His son is my patient and therefore he is done this transfer of care.  According to the patient he is to be seen by [name] for COPD.  Patient is FEV1 34% based on March 2011 PFTs according to chart review.  At baseline he is maintained on Spiriva, Symbicort, Singulair, chronic daily prednisone 10 mg/day for at least 3 years, and 3 times a week of azithromycin for 2 years.  Earlier this year in March 2019 he went on daytime oxygen and following a recent hospitalization April-May 2019.  For syncope that was believed due to nocturnal hypoxemia he was started on night oxygen.  Since starting oxygen his edema has improved and his overall quality of life is improved and he stopped having any orthostasis or presyncopal episodes.  He is grateful for the fact that he is on oxygen.  His current COPD CAT score and severity of symptoms is rated below and is 26.  Show significant amount of symptom burden.  His main goal is to improve  his quality of life.  He had his wife have many questions centering around quality of life, medication therapy.  Of note he has had some thoracic vertebral fractures that are new.  This happened after the fall in late April 2019.  Discovered at admission last month.  He feels is due to prednisone.  He is wondering about portable oxygen.   OV 01/29/2018  Chief Complaint  Patient presents with  . Follow-up    Pt states he has been doing okay since last visit. States breathing is about the same, has an occ cough but states the O2 has helped out a lot with breathing and cough. Denies any complaints of CP.   Lesia Sago II , 72 y.o. ,  with dob [birth year] and male ,Not Hispanic or Latino from [place] [address] - presents to lung clinic for advanced COPD follow-up. Since his last visit his symptoms course of improvement. Score is 26 and severe symptoms of document below. He is on Spiriva, Symbicort, continues oxygen, daily prednisone and azithromycin 3 times a week. He has stopped his singulair without any problems. Overall he says he is better. He is willing to give TRELEGY inhaler trial. Last visit to check blood gas and he does not have hypercapnia so he does not qualify for BiPAP        OV 04/03/2018  Subjective:  Patient ID: Marcelyn Bruins, male , DOB: October 08, 1945 , age 57 y.o. , MRN: [id number] , ADDRESS: [place] [address]   04/03/2018 -   Chief Complaint  Patient presents with  . Acute Visit    Pt's O2 sats have been dropping into the 70s with little exertion.  Pt took O2 off just to shave 9/26 and after he finished shaving put the O2 back on, walked from the bathroom down to the living room on O2 and sats were at 77% on 3L 9/26. Pt also has c/o cough with white to yellow mucus and chest tightness with the SOB.     HPI ROGERS DITTER II 72 y.o. -acute visit for this patient.  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  He is chronically prednisone dependent.  He also is on schedule azithromycin.  He called in yesterday feeling unwell therefore we asked him to come in today.  He tells me that approximately a week ago he started having increased cough and congestion.  Yesterday primary care physician following a routine visit thought he was in COPD exacerbation and started him on doxycycline.  He also personally bumped up his baseline prednisone.  He does me that yesterday while he was shaving on room air he felt more short of breath than usual.  Also when he walked he desaturated more than usual therefore he decided to call in and come in today.   There is no fever or chills or hemoptysis or colored sputum.  No leg edema.  No orthopnea.  He does not feel like he needs to be in the emergency department or get admitted.   OV 05/11/2018  Subjective:  Patient ID: Marcelyn Bruins, male , DOB: [birth year] , age 72 y.o. , MRN: [id number] , ADDRESS: [place] [id number]   05/11/2018 -   Chief Complaint  Patient presents with  . Follow-up    pt states breathing has wrosen since last OV. increased sob, occ chest tightness, prod cough with yellow mucus &  chest congestion. currently taking trelegy samples. recent admission 04/07/18 for PE.     HPI Lesia Sago II 72 y.o. -advanced COPD with chronic hypoxemic respiratory failure [not a BiPAP candidate because of lack of hypercapnia] presents with his wife for follow-up.  After the last visit he continued to have symptoms so on April 07, 2018 he got an outpatient CT scan and he had pulmonary embolism.  He got admitted to the hospital and then discharged and since then has been on Xarelto.  He continues his baseline 2-3 L of nasal cannula oxygen but he says since then he is more symptomatic.  Is also corresponds with him starting Trelegy inhaler and also for the last few to several weeks is having increased cough and congestion and yellow phlegm.  His COPD CAT score is deteriorated to 35 and above.  He is very frustrated by his symptoms.  Review of his medication shows that he takes azithromycin and chronic prednisone and Trelegy and oxygen for his COPD.  He is on Xarelto for his blood clots.  He is also on Fosamax for prednisone dependent osteoporosis management.  His smoking is in remission.  Wife says he is always sedentary.  Apparently they tried to do some physical therapy a month ago but he desaturated.  He does not seem to keen on physical therapy right now     OV 06/10/2018  Subjective:  Patient ID: Marcelyn Bruins, male , DOB: [birth year] , age 20 y.o. , MRN: [id number] ,  ADDRESS: [place] [address]   06/10/2018 -   Chief Complaint  Patient presents with  . Follow-up    follows for COPD. patient has increased SOB with exertion     HPI Lesia Sago II 72 y.o. -returns for gold stage IV COPD follow-up with chronic hypoxemic respiratory failure.  I saw him approximately a month ago at which time recommended Trelegy.  However the Trelegy is not working well for him.  He feels Spiriva and Symbicort is of benefit for him.  Also recommended he start himself on Daliresp.  Given the GI side effect profile I communicated with his primary care physician about stopping Fosamax.  His primary care physician has advised Reclast infusion for him and is considering this but has not yet switched over.  This because the infusions will have to happen at [facility].  However he never started his Daliresp because he read the side effect profile and was worried about back pain.  He wanted me to go over the side effect profile of Daliresp all over again.  I printed up-to-date and went over all the side effects.  I did this with him and his wife.  There is a 3% incidence of backache.  The dominant feature is GI issues of weight loss and diarrhea and nausea and abdominal pain.  The second dominant feature is some anxiety issues.  After reading all this he has decided to start [place].  He is aware of the limitations as well.  He is aware that this is purely preventive in reducing COPD exacerbation frequency.  He understands that the burden on his health from frequent COPD exacerbations is high.  Currently COPD CAT score is back at baseline     CAT COPD Symptom & Quality of Life Score (GSK trademark) 0 is no burden. 5 is highest burden 12/05/2017  01/29/2018  05/11/2018  06/10/2018   Never Cough -> Cough all the time 3 1 5  5  No phlegm in chest -> Chest is full of phlegm 2 2 4 5   No chest tightness -> Chest feels very tight 1 0 3 0  No dyspnea for 1 flight  stairs/hill -> Very dyspneic for 1 flight of stairs 5 4 5 5   No limitations for ADL at home -> Very limited with ADL at home 5 5 5 5   Confident leaving home -> Not at all confident leaving home 3 3 3 4   Sleep soundly -> Do not sleep soundly because of lung condition 3 1 2  0  Lots of Energy -> No energy at all 4 4 5 5   TOTAL Score (max 40)  26 20 37 29     ROS - per HPI     has a past medical history of Chest x-ray abnormality, Chronic back pain, Chronic rhinitis, Compressed spine fracture (HCC), COPD (chronic obstructive pulmonary disease) ([place]), Emphysema lung ([place]), On home oxygen therapy, Onychomycosis, Orthostatic hypotension, Pneumonia, Pulmonary embolism ([place]) (04/07/2018), Skin cancer, and Vertigo.   reports that he has quit smoking. His smoking use included pipe and cigarettes. He started smoking about 8 months ago. He has a 102.00 pack-year smoking history. He has never used smokeless tobacco.  Past Surgical History:  Procedure Laterality Date  . APPENDECTOMY    . SKIN CANCER EXCISION     "lips, face, ears, arms" (04/07/2018)  . TONSILLECTOMY      Allergies  Allergen Reactions  . Tape Other (See Comments)    SKIN IS VERY THIN; CAN ONLY USE COBAN WRAPS DUE TO CONDITION OF SKIN!!    Immunization History  Administered Date(s) Administered  . Influenza Split 04/24/2012  . Influenza Whole 05/08/2009, 04/08/2011  . Influenza, High Dose Seasonal PF 05/01/2017, 03/31/2018  . Influenza,inj,Quad PF,6+ Mos 04/27/2013, 04/15/2014, 04/24/2015  . Influenza-Unspecified 05/13/2016  . Pneumococcal Conjugate-13 02/25/2014  . Pneumococcal Polysaccharide-23 08/15/2011, 12/19/2016  . Pneumococcal-Unspecified 03/08/2017  . Td 02/21/2010  . Tdap 11/05/2017  . Zoster 01/29/2012    Family History  Problem Relation Age of Onset  . Heart disease Mother        CABG in her 15s, nonsmoker  . Cancer Mother   . Stroke Father   . Heart disease Father        Died of MI at age 21, smoker    . Hepatitis Sister   . Coronary artery disease Other        male 1st degree relative <60     Current Outpatient Medications:  .  albuterol (VENTOLIN HFA) 108 (90 Base) MCG/ACT inhaler, USE 2 PUFFS EVERY 6 HOURS  AS NEEDED FOR WHEEZING (Patient taking differently: Inhale 2 puffs into the lungs every 6 (six) hours as needed. ), Disp: 3 Inhaler, Rfl: 3 .  alendronate (FOSAMAX) 70 MG tablet, Take 1 tablet (70 mg total) by mouth once a week. Take with a full glass of water on an empty stomach., Disp: 12 tablet, Rfl: 3 .  azithromycin (ZITHROMAX) 250 MG tablet, TAKE 1 TABLET BY MOUTH ON  MONDAY, WEDNESDAY, AND  FRIDAY (Patient taking differently: Take 250 mg by mouth every Monday, Wednesday, and Friday. ), Disp: 39 tablet, Rfl: 1 .  budesonide-formoterol (SYMBICORT) 160-4.5 MCG/ACT inhaler, USE 2 PUFFS TWO TIMES DAILY, Disp: 30.6 g, Rfl: 2 .  Calcium Carbonate-Vit D-Min (CALCIUM 1200 PO), Take 1,200 mg by mouth daily., Disp: , Rfl:  .  cholecalciferol (VITAMIN D) 1000 units tablet, Take 1,000 Units by mouth daily., Disp: , Rfl:  .  fluticasone (FLONASE) 50 MCG/ACT nasal spray, USE 2 SPRAYS IN EACH  NOSTRIL DAILY (Patient taking differently: Place 2 sprays into both nostrils daily. ), Disp: 48 g, Rfl: 1 .  furosemide (LASIX) 20 MG tablet, Take 1 tablet (20 mg total) by mouth daily as needed for fluid or edema., Disp: 30 tablet, Rfl: 2 .  HYDROcodone-acetaminophen (NORCO/VICODIN) 5-325 MG tablet, Take 1-2 tablets by mouth every 4 (four) hours as needed for moderate pain., Disp: 15 tablet, Rfl: 0 .  Misc. Devices (PULSE OXIMETER FOR FINGER) MISC, 1 Device by Does not apply route as needed., Disp: 1 each, Rfl: 0 .  OXYGEN, Inhale 2 L into the lungs See admin instructions. 2 PO2 at rest and 3 PO2 with exertion. Continuous, Disp: , Rfl:  .  predniSONE (DELTASONE) 10 MG tablet, TAKE 1 TABLET BY MOUTH  DAILY WITH BREAKFAST (Patient taking differently: Take 10 mg by mouth daily with breakfast. ), Disp: 90  tablet, Rfl: 3 .  rivaroxaban (XARELTO) 20 MG TABS tablet, Take 1 tablet (20 mg total) by mouth daily with supper., Disp: 90 tablet, Rfl: 3 .  SPIRIVA RESPIMAT 2.5 MCG/ACT AERS, USE 2 SPRAYS (INHALATIONS)  ONCE DAILY, Disp: 12 g, Rfl: 1 .  traMADol (ULTRAM) 50 MG tablet, Take 1 tablet (50 mg total) by mouth every 6 (six) hours as needed for moderate pain or severe pain., Disp: 20 tablet, Rfl: 0 .  Roflumilast (DALIRESP) 250 MCG TABS, Take 250 mcg by mouth daily. (Patient not taking: Reported on 05/18/2018), Disp: 30 tablet, Rfl: 0      Objective:   Vitals:   06/10/18 0910  BP: 138/80  Pulse: (!) 108  SpO2: 99%  Weight: 162 lb 12.8 oz (73.8 kg)  Height: 5\' 11"  (1.803 m)    Estimated body mass index is 22.71 kg/m as calculated from the following:   Height as of this encounter: 5\' 11"  (1.803 m).   Weight as of this encounter: 162 lb 12.8 oz (73.8 kg).  @WEIGHTCHANGE @  [employer]   06/10/18 0910  Weight: 162 lb 12.8 oz (73.8 kg)     Physical Exam  General Appearance:    Alert, cooperative, no distress, appears stated age - older , Deconditioned looking - yes , OBESE  - no, Sitting on Wheelchair -  no  Head:    Normocephalic, without obvious abnormality, atraumatic  Eyes:    PERRL, conjunctiva/corneas clear,  Ears:    Normal TM's and external ear canals, both ears  Nose:   Nares normal, septum midline, mucosa normal, no drainage    or sinus tenderness. OXYGEN ON  - yes . Patient is @ 3L   Throat:   Lips, mucosa, and tongue normal; teeth and gums normal. Cyanosis on lips - no  Neck:   Supple, symmetrical, trachea midline, no adenopathy;    thyroid:  no enlargement/tenderness/nodules; no carotid   bruit or JVD  Back:     Symmetric, no curvature, ROM normal, no CVA tenderness  Lungs:     Distress - no , Wheeze yes mild baseline, Barrell Chest - yes, Purse lip breathing - yes, Crackles - no   Chest Wall:    No tenderness or deformity.    Heart:    Regular rate and rhythm, S1  and S2 normal, no rub   or gallop, Murmur - no  Breast Exam:    NOT DONE  Abdomen:     Soft, non-tender, bowel sounds active all four quadrants,    no masses,  no organomegaly. Visceral obesity - no  Genitalia:   NOT DONE  Rectal:   NOT DONE  Extremities:   Extremities - normal, Has Cane - no, Clubbing - no, Edema - no  Pulses:   2+ and symmetric all extremities  Skin:   Stigmata of Connective Tissue Disease - no  Lymph nodes:   Cervical, supraclavicular, and axillary nodes normal  Psychiatric:  Neurologic:   Pleasant - yes, Anxious - mild, Flat affect - no  CAm-ICU - neg, Alert and Oriented x 3 - yes, Moves all 4s - yes, Speech - normal, Cognition - intact           Assessment:       ICD-10-CM   1. Chronic respiratory failure with hypoxia (HCC) J96.11   2. Stage 4 very severe COPD by GOLD classification ([place]) J44.9   3. COPD, frequent exacerbations ([place]) J44.1        Plan:     Patient Instructions       ICD-10-CM   1. Chronic respiratory failure with hypoxia (HCC) J96.11   2. Stage 4 very severe COPD by GOLD classification ([place]) J44.9   3. COPD, frequent exacerbations ([place]) J44.1     Glad you are currently stable Too bad Trelegy was not a good fit for you but glad that Spiriva and Symbicort are working well for you  Plan -Continue oxygen, Spiriva, Symbicort scheduled as before -Start ROFLUMILAST as advised from the previous visit   -250 mcg once daily with food for the first month and then 500 mcg once daily  -During this time hold Fosamax    -Extensively discussed side effect profile of the drug and beneficial effects and limitations  Follow-up -6 weeks with [name] OR  nurse practitioner to see how initiation with Daliresp is going  > 50% of this > 25 min visit spent in face to face counseling or coordination of care - by this undersigned MD - Dr Brand Males. This includes one or more of the following documented above: discussion of test results,  diagnostic or treatment recommendations, prognosis, risks and benefits of management options, instructions, education, compliance or risk-factor reduction    SIGNATURE    Dr. Brand Males, M.D., F.C.C.P,  Pulmonary and Critical Care Medicine Staff Physician, [place] Director - Interstitial Lung Disease  Program  [facility] [place] [facility] [place], [place], [address]  Pager: [phone], If no answer or between  15:00h - 7:00h: call [phone] Telephone: [phone]  10:05 AM 06/10/2018

## 2018-06-10 NOTE — Patient Instructions (Addendum)
     ICD-10-CM   1. Chronic respiratory failure with hypoxia (HCC) J96.11   2. Stage 4 very severe COPD by GOLD classification ([place]) J44.9   3. COPD, frequent exacerbations ([place]) J44.1     Glad you are currently stable Too bad Trelegy was not a good fit for you but glad that Spiriva and Symbicort are working well for you  Plan -Continue oxygen, Spiriva, Symbicort scheduled as before -Start ROFLUMILAST as advised from the previous visit   -250 mcg once daily with food for the first month and then 500 mcg once daily  -During this time hold Fosamax    -Extensively discussed side effect profile of the drug and beneficial effects and limitations  Follow-up -6 weeks with [name] OR  nurse practitioner to see how initiation with [place] is going

## 2018-07-06 ENCOUNTER — Ambulatory Visit: Payer: Medicare Other | Admitting: Family Medicine

## 2018-07-13 ENCOUNTER — Ambulatory Visit (INDEPENDENT_AMBULATORY_CARE_PROVIDER_SITE_OTHER): Payer: Medicare Other

## 2018-07-13 ENCOUNTER — Encounter: Payer: Self-pay | Admitting: Family Medicine

## 2018-07-13 ENCOUNTER — Ambulatory Visit (INDEPENDENT_AMBULATORY_CARE_PROVIDER_SITE_OTHER): Payer: Medicare Other | Admitting: Family Medicine

## 2018-07-13 ENCOUNTER — Ambulatory Visit: Payer: Medicare Other | Admitting: Nurse Practitioner

## 2018-07-13 VITALS — BP 124/82 | HR 102 | Temp 98.6°F | Ht 71.0 in | Wt 161.2 lb

## 2018-07-13 DIAGNOSIS — J441 Chronic obstructive pulmonary disease with (acute) exacerbation: Secondary | ICD-10-CM | POA: Diagnosis not present

## 2018-07-13 DIAGNOSIS — R05 Cough: Secondary | ICD-10-CM | POA: Diagnosis not present

## 2018-07-13 DIAGNOSIS — R059 Cough, unspecified: Secondary | ICD-10-CM

## 2018-07-13 IMAGING — DX DG CHEST 2V
2 series · 2 of 2 positions shown · non-contrast
Comparison: [DATE].

CLINICAL DATA: COPD. Worsening shortness of breath and sputum
production. Former smoker.

EXAM:
CHEST - 2 VIEW

[chest pa]
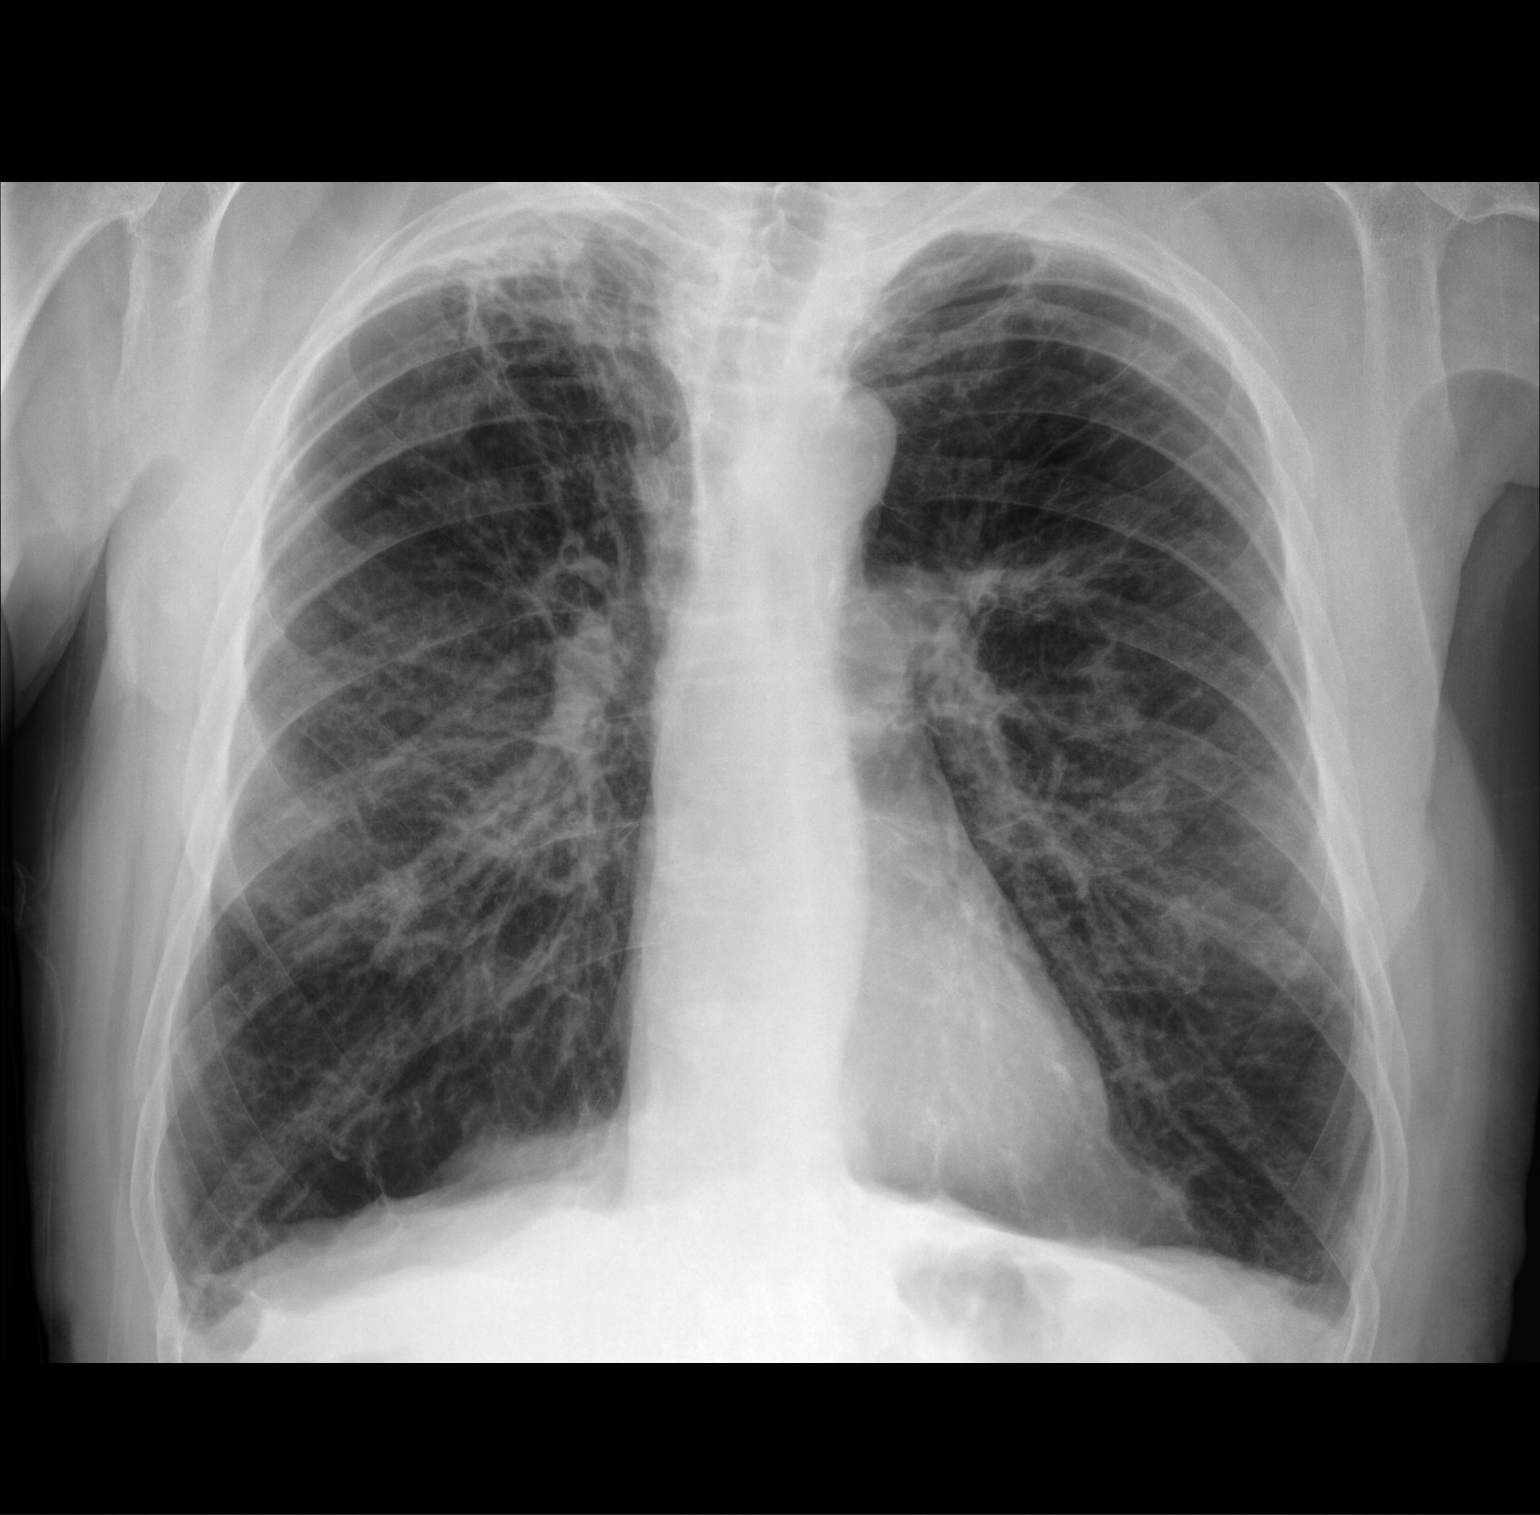

[chest lat]
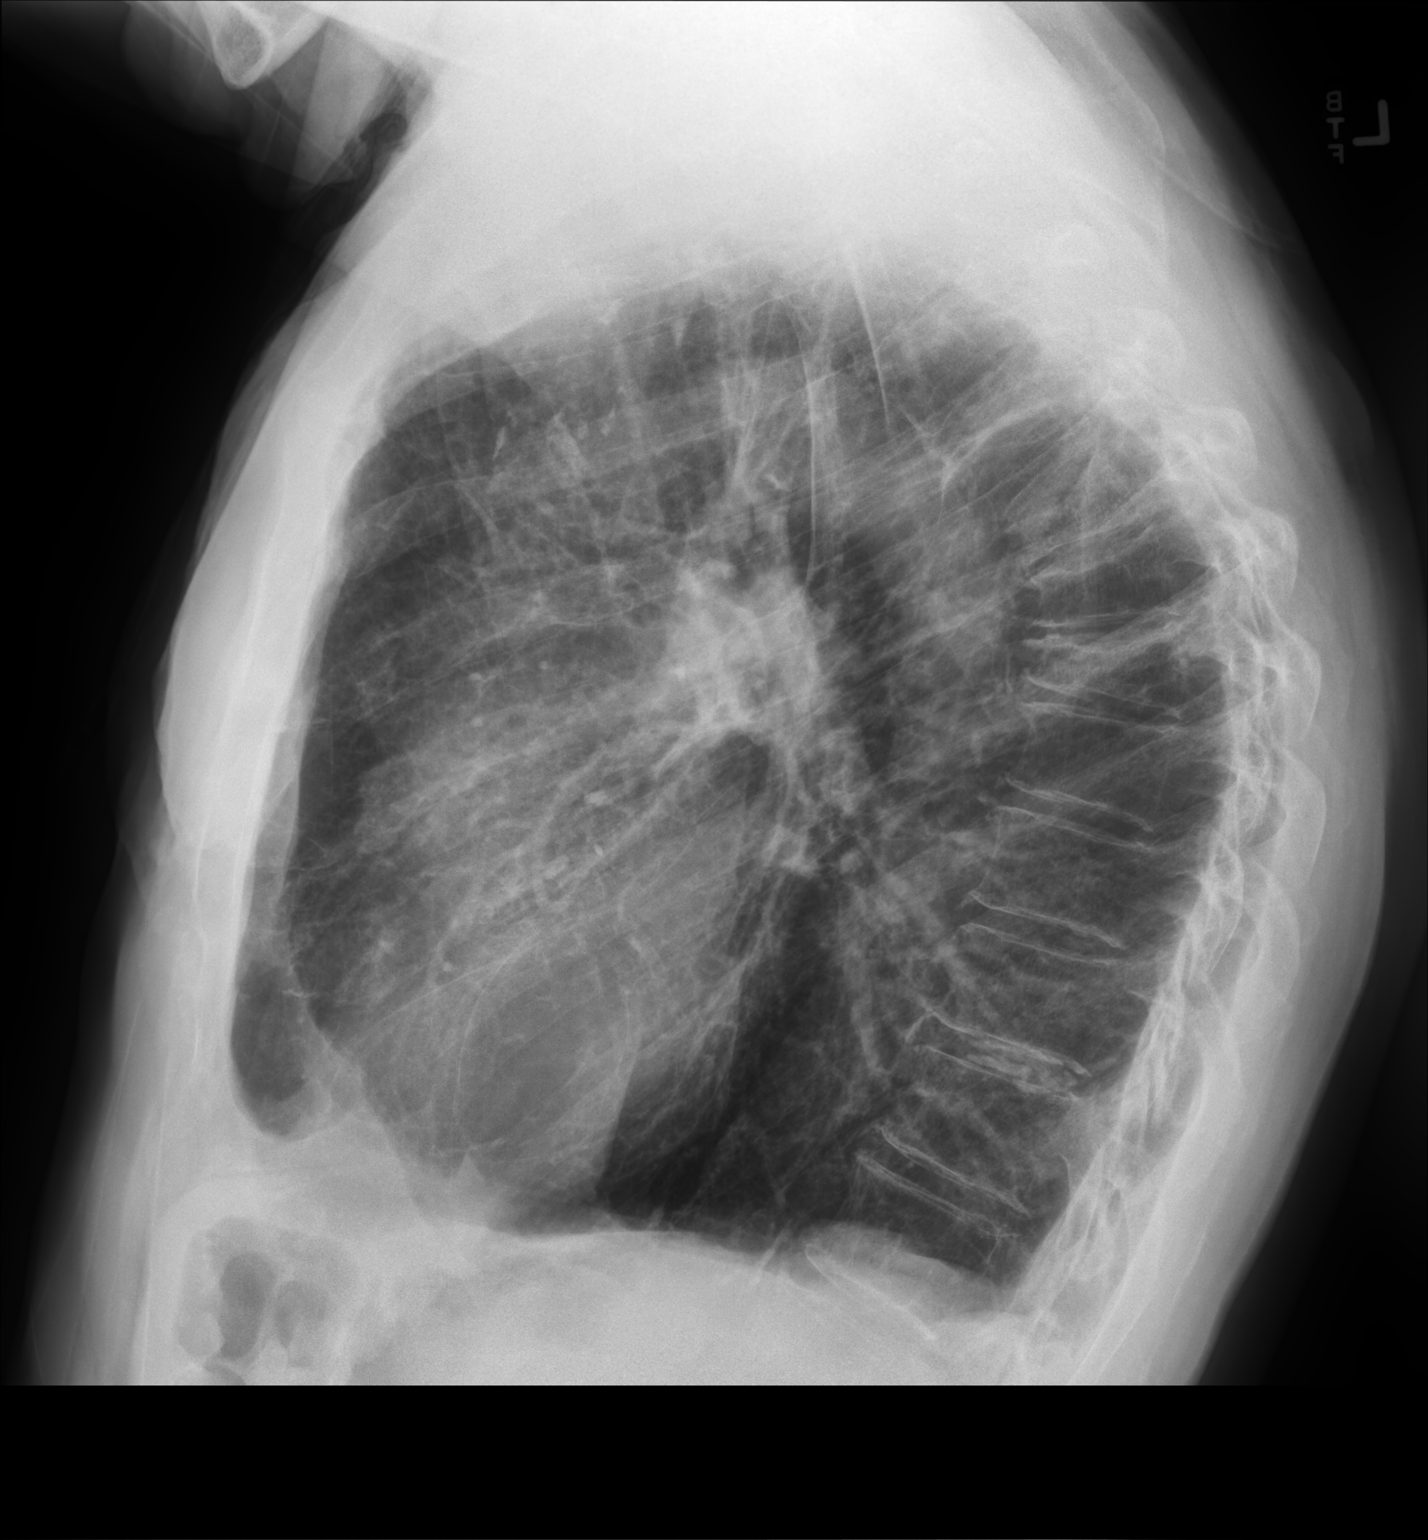

[2 of 2 positions shown; findings below may reference images not displayed]

FINDINGS: The heart size and mediastinal contours are within normal limits.
Both lungs are clear, with emphysematous change. No effusion or
pneumothorax. Upper lobe scarring on the RIGHT is prominent. Chronic
compression deformities in the thoracic spine.
IMPRESSION: No active cardiopulmonary disease.  COPD.

## 2018-07-13 MED ORDER — LEVOFLOXACIN 750 MG PO TABS: 750.0000 mg | ORAL_TABLET | Freq: Every day | ORAL | 0 refills | Status: DC

## 2018-07-13 MED ORDER — BENZONATATE 200 MG PO CAPS: 200.0000 mg | ORAL_CAPSULE | Freq: Two times a day (BID) | ORAL | 0 refills | Status: DC | PRN

## 2018-07-13 NOTE — Progress Notes (Signed)
Patient: Francisco [name] MRN: [id number] DOB: [birth year] PCP: [name], MD     Subjective:  Chief Complaint  Patient presents with  . Cough    HPI: The patient is a 73 y.o. male who presents today for cough for over 5 days. He has had productive cough with yellow to clare sputum. He denies any fever but has had chills. He states for the last week he has been coughing a lot and congested and is coughing so much he can't not sleep at night. Cough is a little worse at night. He does have emphysema. He states he is coughing up more sputum and it's a different color. His wife was sick similar symptoms. He is not more short of breath from baseline and no wheezing. No ear pain/sinus pain or pressure, but he has had an headache. No fever. He has taken cough medication over the counter. He takes azithromycin three days a week.   Review of Systems  Constitutional: Positive for chills. Negative for activity change and fatigue.       Not able to sleep due to cough.   HENT: Positive for congestion. Negative for ear discharge, ear pain, rhinorrhea, sinus pressure and sinus pain.   Eyes: Positive for discharge and itching.  Respiratory: Positive for cough and shortness of breath. Negative for chest tightness and wheezing.   Cardiovascular: Negative for chest pain and leg swelling.  Genitourinary: Negative for dysuria, flank pain and frequency.  Neurological: Positive for headaches. Negative for dizziness.    Allergies Patient is allergic to tape.  Past Medical History Patient  has a past medical history of Chest x-ray abnormality, Chronic back pain, Chronic rhinitis, Compressed spine fracture ([place]), COPD (chronic obstructive pulmonary disease) ([place]), Emphysema lung ([place]), On home oxygen therapy, Onychomycosis, Orthostatic hypotension, Pneumonia, Pulmonary embolism ([place]) (04/07/2018), Skin cancer, and Vertigo.  Surgical History Patient  has a past surgical history that includes  Appendectomy; Tonsillectomy; and Skin cancer excision.  Family History Pateint's family history includes Cancer in his mother; Coronary artery disease in an other family member; Heart disease in his father and mother; Hepatitis in his sister; Stroke in his father.  Social History Patient  reports that he has quit smoking. His smoking use included pipe and cigarettes. He started smoking about 9 months ago. He has a 102.00 pack-year smoking history. He has never used smokeless tobacco. He reports current alcohol use of about 28.0 standard drinks of alcohol per week. He reports that he does not use drugs.    Objective: Vitals:   07/13/18 1322  BP: 124/82  Pulse: (!) 102  Temp: 98.6 F (37 C)  TempSrc: Oral  SpO2: 93%  Weight: 161 lb 3.2 oz (73.1 kg)  Height: 5\' 11"  (1.803 m)    Body mass index is 22.48 kg/m.  Physical Exam Vitals signs reviewed.  Constitutional:      General: He is not in acute distress.    Appearance: He is not toxic-appearing.  HENT:     Right Ear: Tympanic membrane and ear canal normal.     Left Ear: Tympanic membrane and ear canal normal.     Nose: Nose normal.  Neck:     Musculoskeletal: Normal range of motion and neck supple.  Cardiovascular:     Rate and Rhythm: Normal rate and regular rhythm.     Heart sounds: No murmur.  Pulmonary:     Breath sounds: Wheezing present.     Comments: Poor air movement in bilateral lower  lobes. Expiratory wheezes in apices/upper lobes bilaterally no rales/crackles.  Abdominal:     General: Abdomen is flat. Bowel sounds are normal.     Palpations: Abdomen is soft.  Lymphadenopathy:     Cervical: No cervical adenopathy.  Skin:    Capillary Refill: Capillary refill takes less than 2 seconds.  Neurological:     General: No focal deficit present.     Mental Status: He is alert.       CXR: no acute findings. Appears unchanged since last film on 03/2018. Official read pending.   Assessment/plan: 1. COPD  exacerbation ([place]) Starting 5 day course of levquin. Hold azithromycin while on this. They want cough medication, but I discussed will not do a codeine cough syrup with already poor pulm function. Do not want to suppress respiratory drive anymore. Recommended honey daily and will do trial of tessalon pearls. Did not get sputum culture. If not better would f/u with pcp or pulm doc.  - DG Chest 2 View; Future   Return if symptoms worsen or fail to improve.   Orma Flaming, MD [place]   07/13/2018

## 2018-07-13 NOTE — Patient Instructions (Signed)
Stop the azithromycin while on levaquin. Can resume when you are done.  Honey is great for cough as well.   Chronic Obstructive Pulmonary Disease Exacerbation Chronic obstructive pulmonary disease (COPD) is a long-term (chronic) lung problem. In COPD, the flow of air from the lungs is limited. COPD exacerbations are times that breathing gets worse and you need more than your normal treatment. Without treatment, they can be life threatening. If they happen often, your lungs can become more damaged. If your COPD gets worse, your doctor may treat you with:  Medicines.  Oxygen.  Different ways to clear your airway, such as using a mask. Follow these instructions at home: Medicines  Take over-the-counter and prescription medicines only as told by your doctor.  If you take an antibiotic or steroid medicine, do not stop taking the medicine even if you start to feel better.  Keep up with shots (vaccinations) as told by your doctor. Be sure to get a yearly (annual) flu shot. Lifestyle  Do not smoke. If you need help quitting, ask your doctor.  Eat healthy foods.  Exercise regularly.  Get plenty of sleep.  Avoid tobacco smoke and other things that can bother your lungs.  Wash your hands often with soap and water. This will help keep you from getting an infection. If you cannot use soap and water, use hand sanitizer.  During flu season, avoid areas that are crowded with people. General instructions  Drink enough fluid to keep your pee (urine) clear or pale yellow. Do not do this if your doctor has told you not to.  Use a cool mist machine (vaporizer).  If you use oxygen or a machine that turns medicine into a mist (nebulizer), continue to use it as told.  Follow all instructions for rehabilitation. These are steps you can take to make your body work better.  Keep all follow-up visits as told by your doctor. This is important. Contact a doctor if:  Your COPD symptoms get worse than  normal. Get help right away if:  You are short of breath and it gets worse.  You have trouble talking.  You have chest pain.  You cough up blood.  You have a fever.  You keep throwing up (vomiting).  You feel weak or you pass out (faint).  You feel confused.  You are not able to sleep because of your symptoms.  You are not able to do daily activities. Summary  COPD exacerbations are times that breathing gets worse and you need more treatment than normal.  COPD exacerbations can be very serious and may cause your lungs to become more damaged.  Do not smoke. If you need help quitting, ask your doctor.  Stay up-to-date on your shots. Get a flu shot every year. This information is not intended to replace advice given to you by your health care provider. Make sure you discuss any questions you have with your health care provider. Document Released: 06/13/2011 Document Revised: 07/29/2016 Document Reviewed: 07/29/2016 Elsevier Interactive Patient Education  2019 [employer].

## 2018-07-14 ENCOUNTER — Encounter: Payer: Self-pay | Admitting: Family Medicine

## 2018-07-14 MED ORDER — DOXYCYCLINE HYCLATE 100 MG PO TABS: 100.0000 mg | ORAL_TABLET | Freq: Two times a day (BID) | ORAL | 0 refills | Status: AC

## 2018-07-22 ENCOUNTER — Ambulatory Visit (INDEPENDENT_AMBULATORY_CARE_PROVIDER_SITE_OTHER): Payer: Medicare Other | Admitting: Internal Medicine

## 2018-07-22 ENCOUNTER — Encounter: Payer: Self-pay | Admitting: Internal Medicine

## 2018-07-22 VITALS — BP 124/60 | HR 113 | Ht 71.0 in | Wt 160.4 lb

## 2018-07-22 DIAGNOSIS — J9611 Chronic respiratory failure with hypoxia: Secondary | ICD-10-CM | POA: Diagnosis not present

## 2018-07-22 DIAGNOSIS — J441 Chronic obstructive pulmonary disease with (acute) exacerbation: Secondary | ICD-10-CM | POA: Diagnosis not present

## 2018-07-22 DIAGNOSIS — J449 Chronic obstructive pulmonary disease, unspecified: Secondary | ICD-10-CM

## 2018-07-22 MED ORDER — PREDNISONE 10 MG PO TABS: ORAL_TABLET | ORAL | 0 refills | Status: DC

## 2018-07-22 NOTE — Progress Notes (Signed)
ROV 12/30/16 -- patient has a history of tobacco use, COPD currently maintained on chronic prednisone, Symbicort, Spiriva. Most recent blurry function testing was March 2011 with an FEV1 of 1.2 L (34% Pred), severe obstruction. He up titrates his prednisone on his own based on symptoms. No abx since last time. He has increased pred temporrily about 4 -5 x since last time. No increases for over a month. He is smoking a pipe, not cigarettes. He is using HCTZ more frequently these days. He is on flonase and singulair. He does note that he has some slow progression of his DOE. He coughs a few times a day, clears clear sputum.   rov 07/17/17 --this is a follow-up visit for patient with active tobacco use, severe COPD and chronic prednisone use.  He often titrates his prednisone on his own depending on how he is feeling. He complains of a new pain in his back below his left shoulder blade, has been present for the last 10 days. sometimes pleuritic, can be stabbing, worse w cough. His pred use: on 10mg  for the last 19 days. No hx VTE. He has LE edema, venous stasis changes, increased since his diuretics decreased.   ROV 09/23/17 --73 year old man with a history of tobacco use (still smokes pipe), severe COPD and severe obstruction on spirometry.  At his last visit he had some pleuritic left back pain and I performed a CT PA that I have reviewed from 07/17/17.  There was no pulmonary embolism but extensive emphysema present, some bilateral apical scar more so on the right, stable.  He uses prednisone 10 mg daily and uptitrate depending on his day-to-day symptoms.  He is otherwise managed on Symbicort and Spiriva.  Desaturated on arrival today after ambulating to the exam room. He reports more dyspnea, has increased pred intermittently.    OV 12/05/2017  Chief Complaint  Patient presents with  . Follow-up    Switching from RB to MR.   Pt is on 3L with exertion and 2L at rest. Pt states his breathing had  become worse but since he has been on O2 since 3/21, it has really helped with his breathing.  Pt was in hospital 4/28-5/3. Other than SOB, pt does have c/o cough, rattling in chest. Denies any CP/chest tightness.   History from patient, his wife and review of the old chart  Talmadge Coventry IIIs a transfer of care from [name] to myself [name].  His son is my patient and therefore he is done this transfer of care.  According to the patient he is to be seen by [name] for COPD.  Patient is FEV1 34% based on March 2011 PFTs according to chart review.  At baseline he is maintained on Spiriva, Symbicort, Singulair, chronic daily prednisone 10 mg/day for at least 3 years, and 3 times a week of azithromycin for 2 years.  Earlier this year in March 2019 he went on daytime oxygen and following a recent hospitalization April-May 2019.  For syncope that was believed due to nocturnal hypoxemia he was started on night oxygen.  Since starting oxygen his edema has improved and his overall quality of life is improved and he stopped having any orthostasis or presyncopal episodes.  He is grateful for the fact that he is on oxygen.  His current COPD CAT score and severity of symptoms is rated below and is 26.  Show significant amount of symptom burden.  His main goal is to improve his  quality of life.  He had his wife have many questions centering around quality of life, medication therapy.  Of note he has had some thoracic vertebral fractures that are new.  This happened after the fall in late April 2019.  Discovered at admission last month.  He feels is due to prednisone.  He is wondering about portable oxygen.   OV 01/29/2018  Chief Complaint  Patient presents with  . Follow-up    Pt states he has been doing okay since last visit. States breathing is about the same, has an occ cough but states the O2 has helped out a lot with breathing and cough. Denies any complaints of CP.   Lesia Sago II , 73 y.o. ,  with dob 02/27/1946 and male ,Not Hispanic or Latino from [place] [id number] - presents to lung clinic for advanced COPD follow-up. Since his last visit his symptoms course of improvement. Score is 26 and severe symptoms of document below. He is on Spiriva, Symbicort, continues oxygen, daily prednisone and azithromycin 3 times a week. He has stopped his singulair without any problems. Overall he says he is better. He is willing to give TRELEGY inhaler trial. Last visit to check blood gas and he does not have hypercapnia so he does not qualify for BiPAP        OV 04/03/2018  Subjective:  Patient ID: Marcelyn Bruins, male , DOB: 06/04/1946 , age 35 y.o. , MRN: [id number] , ADDRESS: [place] [id number]   04/03/2018 -   Chief Complaint  Patient presents with  . Acute Visit    Pt's O2 sats have been dropping into the 70s with little exertion.  Pt took O2 off just to shave 9/26 and after he finished shaving put the O2 back on, walked from the bathroom down to the living room on O2 and sats were at 77% on 3L 9/26. Pt also has c/o cough with white to yellow mucus and chest tightness with the SOB.     HPI DARRELD HOFFER II 73 y.o. -acute visit for this patient.  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  He is chronically prednisone dependent.  He also is on schedule azithromycin.  He called in yesterday feeling unwell therefore we asked him to come in today.  He tells me that approximately a week ago he started having increased cough and congestion.  Yesterday primary care physician following a routine visit thought he was in COPD exacerbation and started him on doxycycline.  He also personally bumped up his baseline prednisone.  He does me that yesterday while he was shaving on room air he felt more short of breath than usual.  Also when he walked he desaturated more than usual therefore he decided to call in and come in today.   There is no fever or chills or hemoptysis or colored sputum.  No leg edema.  No orthopnea.  He does not feel like he needs to be in the emergency department or get admitted.   OV 05/11/2018  Subjective:  Patient ID: Marcelyn Bruins, male , DOB: 04/19/1946 , age 22 y.o. , MRN: [id number] , ADDRESS: [place] [id number]   05/11/2018 -   Chief Complaint  Patient presents with  . Follow-up    pt states breathing has wrosen since last OV. increased sob, occ chest tightness, prod cough with yellow mucus &  chest congestion. currently taking trelegy samples. recent admission 04/07/18 for PE.     HPI Lesia Sago II 73 y.o. -advanced COPD with chronic hypoxemic respiratory failure [not a BiPAP candidate because of lack of hypercapnia] presents with his wife for follow-up.  After the last visit he continued to have symptoms so on April 07, 2018 he got an outpatient CT scan and he had pulmonary embolism.  He got admitted to the hospital and then discharged and since then has been on Xarelto.  He continues his baseline 2-3 L of nasal cannula oxygen but he says since then he is more symptomatic.  Is also corresponds with him starting Trelegy inhaler and also for the last few to several weeks is having increased cough and congestion and yellow phlegm.  His COPD CAT score is deteriorated to 35 and above.  He is very frustrated by his symptoms.  Review of his medication shows that he takes azithromycin and chronic prednisone and Trelegy and oxygen for his COPD.  He is on Xarelto for his blood clots.  He is also on Fosamax for prednisone dependent osteoporosis management.  His smoking is in remission.  Wife says he is always sedentary.  Apparently they tried to do some physical therapy a month ago but he desaturated.  He does not seem to keen on physical therapy right now     OV 06/10/2018  Subjective:  Patient ID: Marcelyn Bruins, male , DOB: [birth year] , age 59 y.o. , MRN: [id number] ,  ADDRESS: [place] [address]   06/10/2018 -   Chief Complaint  Patient presents with  . Follow-up    follows for COPD. patient has increased SOB with exertion     HPI Lesia Sago II 73 y.o. -returns for gold stage IV COPD follow-up with chronic hypoxemic respiratory failure.  I saw him approximately a month ago at which time recommended Trelegy.  However the Trelegy is not working well for him.  He feels Spiriva and Symbicort is of benefit for him.  Also recommended he start himself on Daliresp.  Given the GI side effect profile I communicated with his primary care physician about stopping Fosamax.  His primary care physician has advised Reclast infusion for him and is considering this but has not yet switched over.  This because the infusions will have to happen at [facility] long.  However he never started his Daliresp because he read the side effect profile and was worried about back pain.  He wanted me to go over the side effect profile of Daliresp all over again.  I printed up-to-date and went over all the side effects.  I did this with him and his wife.  There is a 3% incidence of backache.  The dominant feature is GI issues of weight loss and diarrhea and nausea and abdominal pain.  The second dominant feature is some anxiety issues.  After reading all this he has decided to start [place].  He is aware of the limitations as well.  He is aware that this is purely preventive in reducing COPD exacerbation frequency.  He understands that the burden on his health from frequent COPD exacerbations is high.  Currently COPD CAT score is back at baseline        OV 07/22/2018  Subjective:  Patient ID: Marcelyn Bruins, male , DOB: 10/06/1945 , age 76 y.o. , MRN: [id number] , ADDRESS: [place] [id number]   07/22/2018 -  Chief Complaint  Patient presents with  . Follow-up    Pt states it has been rough since last visit. States he has been coughing with  white to yellow phlegm, wheezing, increased SOB which has been going on x2-3 weeks now. Pt denies any real complaints of chest tightness/chest pain.     HPI KARANDEEP RESENDE II 73 y.o. -presents for follow-up of his advanced COPD with chronic hypoxemic respiratory failure.  At last visit we introduce Daliresp as is an effort to prevent COPD flareups.  After some trepidation he started taking it.  He is currently on full dose 5 mcg daily and is tolerating it well.  However he does not think it is reduced the frequency of flareups but again it is too soon to tell.  Approximately over a week ago after some sick contact exposure he had symptoms and signs of COPD exacerbation.  He was given I suspect Levaquin but this caused some confusion.  After that he was changed to doxycycline.  Today's his last of 7 days with doxycycline he is better.  COPD CAT score is improved to 26 but he still is coughing quite a bit and has mucus and feels that he is still not fully back to baseline.  He had a chest x-ray July 13, 2018 that reports this is clear.  He continues on oxygen Spiriva and Symbicort.  There are no other new issues.  No chest pain edema hemoptysis or weight loss.    CAT COPD Symptom & Quality of Life Score (GSK trademark) 0 is no burden. 5 is highest burden 12/05/2017  01/29/2018  05/11/2018  06/10/2018  07/22/2018   Never Cough -> Cough all the time 3 1 5 5 2   No phlegm in chest -> Chest is full of phlegm 2 2 4 5 3   No chest tightness -> Chest feels very tight 1 0 3 0 1  No dyspnea for 1 flight stairs/hill -> Very dyspneic for 1 flight of stairs 5 4 5 5 5   No limitations for ADL at home -> Very limited with ADL at home 5 5 5 5 5   Confident leaving home -> Not at all confident leaving home 3 3 3 4 4   Sleep soundly -> Do not sleep soundly because of lung condition 3 1 2  0 1  Lots of Energy -> No energy at all 4 4 5 5 5   TOTAL Score (max 40)  26 20 37 29 26     ROS - per HPI     has a past  medical history of Chest x-ray abnormality, Chronic back pain, Chronic rhinitis, Compressed spine fracture (HCC), COPD (chronic obstructive pulmonary disease) ([place]), Emphysema lung ([place]), On home oxygen therapy, Onychomycosis, Orthostatic hypotension, Pneumonia, Pulmonary embolism ([place]) (04/07/2018), Skin cancer, and Vertigo.   reports that he has quit smoking. His smoking use included pipe and cigarettes. He started smoking about 9 months ago. He has a 102.00 pack-year smoking history. He has never used smokeless tobacco.  Past Surgical History:  Procedure Laterality Date  . APPENDECTOMY    . SKIN CANCER EXCISION     "lips, face, ears, arms" (04/07/2018)  . TONSILLECTOMY      Allergies  Allergen Reactions  . Tape Other (See Comments)    SKIN IS VERY THIN; CAN ONLY USE COBAN WRAPS DUE TO CONDITION OF SKIN!!  . Levaquin [Levofloxacin]     hallucinations    Immunization History  Administered Date(s) Administered  . Influenza Split 04/24/2012  .  Influenza Whole 05/08/2009, 04/08/2011  . Influenza, High Dose Seasonal PF 05/01/2017, 03/31/2018  . Influenza,inj,Quad PF,6+ Mos 04/27/2013, 04/15/2014, 04/24/2015  . Influenza-Unspecified 05/13/2016  . Pneumococcal Conjugate-13 02/25/2014  . Pneumococcal Polysaccharide-23 08/15/2011, 12/19/2016  . Pneumococcal-Unspecified 03/08/2017  . Td 02/21/2010  . Tdap 11/05/2017  . Zoster 01/29/2012    Family History  Problem Relation Age of Onset  . Heart disease Mother        CABG in her 4s, nonsmoker  . Cancer Mother   . Stroke Father   . Heart disease Father        Died of MI at age 24, smoker  . Hepatitis Sister   . Coronary artery disease Other        male 1st degree relative <60     Current Outpatient Medications:  .  albuterol (VENTOLIN HFA) 108 (90 Base) MCG/ACT inhaler, USE 2 PUFFS EVERY 6 HOURS  AS NEEDED FOR WHEEZING (Patient taking differently: Inhale 2 puffs into the lungs every 6 (six) hours as needed. ), Disp: 3  Inhaler, Rfl: 3 .  benzonatate (TESSALON) 200 MG capsule, Take 1 capsule (200 mg total) by mouth 2 (two) times daily as needed for cough., Disp: 20 capsule, Rfl: 0 .  budesonide-formoterol (SYMBICORT) 160-4.5 MCG/ACT inhaler, USE 2 PUFFS TWO TIMES DAILY, Disp: 30.6 g, Rfl: 2 .  Calcium Carbonate-Vit D-Min (CALCIUM 1200 PO), Take 1,200 mg by mouth daily., Disp: , Rfl:  .  cholecalciferol (VITAMIN D) 1000 units tablet, Take 1,000 Units by mouth daily., Disp: , Rfl:  .  fluticasone (FLONASE) 50 MCG/ACT nasal spray, USE 2 SPRAYS IN EACH  NOSTRIL DAILY (Patient taking differently: Place 2 sprays into both nostrils daily. ), Disp: 48 g, Rfl: 1 .  HYDROcodone-acetaminophen (NORCO/VICODIN) 5-325 MG tablet, Take 1-2 tablets by mouth every 4 (four) hours as needed for moderate pain., Disp: 15 tablet, Rfl: 0 .  Misc. Devices (PULSE OXIMETER FOR FINGER) MISC, 1 Device by Does not apply route as needed., Disp: 1 each, Rfl: 0 .  OXYGEN, Inhale 2 L into the lungs See admin instructions. 2 PO2 at rest and 3 PO2 with exertion. Continuous, Disp: , Rfl:  .  predniSONE (DELTASONE) 10 MG tablet, TAKE 1 TABLET BY MOUTH  DAILY WITH BREAKFAST (Patient taking differently: Take 10 mg by mouth daily with breakfast. ), Disp: 90 tablet, Rfl: 3 .  rivaroxaban (XARELTO) 20 MG TABS tablet, Take 1 tablet (20 mg total) by mouth daily with supper., Disp: 90 tablet, Rfl: 3 .  roflumilast (DALIRESP) 500 MCG TABS tablet, Take 1 tablet (500 mcg total) by mouth daily., Disp: 90 tablet, Rfl: 2 .  SPIRIVA RESPIMAT 2.5 MCG/ACT AERS, USE 2 SPRAYS (INHALATIONS)  ONCE DAILY, Disp: 12 g, Rfl: 1 .  traMADol (ULTRAM) 50 MG tablet, Take 1 tablet (50 mg total) by mouth every 6 (six) hours as needed for moderate pain or severe pain., Disp: 20 tablet, Rfl: 0 .  furosemide (LASIX) 20 MG tablet, Take 1 tablet (20 mg total) by mouth daily as needed for fluid or edema. (Patient not taking: Reported on 07/22/2018), Disp: 30 tablet, Rfl: 2      Objective:     Vitals:   07/22/18 0851  BP: 124/60  Pulse: (!) 113  SpO2: 96%  Weight: 160 lb 6.4 oz (72.8 kg)  Height: 5\' 11"  (1.803 m)    Estimated body mass index is 22.37 kg/m as calculated from the following:   Height as of this encounter: 5\' 11"  (1.803 m).  Weight as of this encounter: 160 lb 6.4 oz (72.8 kg).  @WEIGHTCHANGE @  [employer]   07/22/18 0851  Weight: 160 lb 6.4 oz (72.8 kg)     Physical Exam  General Appearance:    Alert, cooperative, no distress, appears stated age - yes , Deconditioned looking - yes , OBESE  - no, Sitting on Wheelchair -  no  Head:    Normocephalic, without obvious abnormality, atraumatic  Eyes:    PERRL, conjunctiva/corneas clear,  Ears:    Normal TM's and external ear canals, both ears  Nose:   Nares normal, septum midline, mucosa normal, no drainage    or sinus tenderness. OXYGEN ON  - yes . Patient is @ 3L   Throat:   Lips, mucosa, and tongue normal; teeth and gums normal. Cyanosis on lips - no  Neck:   Supple, symmetrical, trachea midline, no adenopathy;    thyroid:  no enlargement/tenderness/nodules; no carotid   bruit or JVD  Back:     Symmetric, no curvature, ROM normal, no CVA tenderness  Lungs:     Distress - no , Wheeze faint scattered wheeze at the upper lobe and lower lobe that is baseline, Barrell Chest -yes, Purse lip breathing -yes baseline, Crackles -no  Chest Wall:    No tenderness or deformity.    Heart:    Regular rate and rhythm, S1 and S2 normal, no rub   or gallop, Murmur -no  Breast Exam:    NOT DONE  Abdomen:     Soft, non-tender, bowel sounds active all four quadrants,    no masses, no organomegaly. Visceral obesity - no  Genitalia:   NOT DONE  Rectal:   NOT DONE  Extremities:   Extremities - normal, Has Cane - no, Clubbing - no, Edema - no  Pulses:   2+ and symmetric all extremities  Skin:   Stigmata of Connective Tissue Disease - no  Lymph nodes:   Cervical, supraclavicular, and axillary nodes normal   Psychiatric:  Neurologic:   Pleasant - yes, Anxious - n, Flat affect - no  CAm-ICU - neg, Alert and Oriented x 3 - yes, Moves all 4s - yes, Speech - normal, Cognition - intact           Assessment:       ICD-10-CM   1. COPD with acute exacerbation ([place]) J44.1   2. Stage 4 very severe COPD by GOLD classification ([place]) J44.9   3. Chronic respiratory failure with hypoxia (HCC) J96.11        Plan:     Patient Instructions     ICD-10-CM   1. COPD with acute exacerbation ([place]) J44.1   2. Stage 4 very severe COPD by GOLD classification ([place]) J44.9   3. Chronic respiratory failure with hypoxia ([place]) J96.11    CXR 07/13/2018 - clear Partially improved from recent flare up but still some ways to go  Plan Prednisone 40mg  daily x 2 days, then 30mg  daily x 2 days, then 20mg  daily x 2 days and then 10mg  daily to continue  Continue o2, scheduled inhalers  Continue daliresp  Followup 3 months  Or sooner if needed; CAT score at followup     SIGNATURE    Dr. Brand Males, M.D., F.C.C.P,  Pulmonary and Critical Care Medicine Staff Physician, [place] Director - Interstitial Lung Disease  Program  Pulmonary [facility] at [place], [place], [address]  Pager: [phone], If no  answer or between  15:00h - 7:00h: call [phone] Telephone: [phone]  9:23 AM 07/22/2018

## 2018-07-22 NOTE — Addendum Note (Signed)
Addended by: Lorretta Harp on: 07/22/2018 09:26 AM   Modules accepted: Orders

## 2018-07-22 NOTE — Patient Instructions (Addendum)
ICD-10-CM   1. COPD with acute exacerbation ([place]) J44.1   2. Stage 4 very severe COPD by GOLD classification ([place]) J44.9   3. Chronic respiratory failure with hypoxia ([place]) J96.11    CXR 07/13/2018 - clear Partially improved from recent flare up but still some ways to go  Plan Prednisone 40mg  daily x 2 days, then 30mg  daily x 2 days, then 20mg  daily x 2 days and then 10mg  daily to continue  Continue o2, scheduled inhalers  Continue daliresp  Followup 3 months  Or sooner if needed; CAT score at followup

## 2018-07-24 ENCOUNTER — Other Ambulatory Visit: Payer: Self-pay | Admitting: Internal Medicine

## 2018-08-20 ENCOUNTER — Other Ambulatory Visit: Payer: Self-pay | Admitting: Internal Medicine

## 2018-09-01 ENCOUNTER — Telehealth: Payer: Self-pay | Admitting: Internal Medicine

## 2018-09-01 NOTE — Telephone Encounter (Signed)
Called and advised the patient that we would need him to bring the [place] Duty paper to the office so that we could have MR advised.

## 2018-09-14 NOTE — Telephone Encounter (Signed)
mychart message received from pt in regards to needing the form for jury duty excusal completed.  Form was brought to office by pt last week and the form is in an envelope in MR's office. MR, pt is needing form completed as soon as possible so we can send it off for pt.

## 2018-09-15 NOTE — Telephone Encounter (Signed)
Ok will do it but here is a letter to attach  To the [place] II with 1946/01/08 and address [place] [address] is my patient. He has end-stage, debilitating, life threatening COPD advanced lung disease. He is on lot of oxygen. He cannot handle the stress (physical, and emotional) of jury duty. Moreover, with COVID-19 pandemic at our door step he is extremely vulnerable to dying from it if he got it. Only thing that will work is to reduce risk of him getting it and that is "extreme social distancing". Under no circumstances therefore he be considered for jury duty. In fact, no one over 60 or anyone with any health issues should be doing jury duty for next 2 months till COVID-19 pandemic ends  Sincerely yours   Dr Gaye Alken    Dr. Brand Males, M.D., F.C.C.P,  Pulmonary and Critical Care Medicine Staff Physician, [place] Director - Interstitial Lung Disease  Program  Pulmonary [facility] at [place], [place], [id number]

## 2018-09-16 NOTE — Telephone Encounter (Signed)
Letter has been typed up and placed with the form and has been placed in the mail to be sent to the court for the jury excusal request.  Called and spoke with pt's wife Francisco [name] letting her know that the paperwork has been taken care of for excusal from jury duty. Stated to her that I had made a copy for them to keep for their records and asked if she wanted me to place it in the mail for them or if they wanted to come by office to pick up their copies. Per Francisco [name], place it in mail for them. Letter and form for pt has been placed in the mail. Nothing further needed.

## 2018-09-22 ENCOUNTER — Other Ambulatory Visit: Payer: Self-pay | Admitting: *Deleted

## 2018-09-22 MED ORDER — PREDNISONE 10 MG PO TABS: 10.0000 mg | ORAL_TABLET | Freq: Every day | ORAL | 3 refills | Status: DC

## 2018-09-22 MED ORDER — ALBUTEROL SULFATE HFA 108 (90 BASE) MCG/ACT IN AERS: INHALATION_SPRAY | RESPIRATORY_TRACT | 3 refills | Status: DC

## 2018-09-24 ENCOUNTER — Telehealth: Payer: Self-pay

## 2018-09-24 MED ORDER — TRAMADOL HCL 50 MG PO TABS: 50.0000 mg | ORAL_TABLET | Freq: Four times a day (QID) | ORAL | 1 refills | Status: DC | PRN

## 2018-09-24 NOTE — Telephone Encounter (Signed)
Called and spoke with wife, Katy Apo, she advised that a refill on Tramadol would be just fine. Rx pending.

## 2018-09-24 NOTE — Addendum Note (Signed)
Addended by: [name] on: 09/24/2018 10:56 PM   Modules accepted: Orders

## 2018-09-24 NOTE — Addendum Note (Signed)
Addended by: Jasper Loser on: 09/24/2018 01:08 PM   Modules accepted: Orders

## 2018-09-24 NOTE — Telephone Encounter (Signed)
Refill provided-please let patient know

## 2018-09-24 NOTE — Telephone Encounter (Signed)
Is he simply requesting a refill of tramadol?  If so I can refill that since I prescribed it before

## 2018-09-24 NOTE — Telephone Encounter (Signed)
Spoke with Katy Apo, she is requesting something for her husbands back pain. She is agreeable to [place] visit.

## 2018-09-24 NOTE — Telephone Encounter (Signed)
Please get more information from patient-if she talking about pain medicine for her or for her husband? If for her husband-have her place this on his MyChart account. For either of them-please document that they consent to being treated by my chart if this medication has not been previously prescribed.  Thanks, Garret Reddish  ----- Message -----  From: Darral Dash, CMA  Sent: 09/23/2018  1:47 PM EDT  To: [name], MD  Subject: RE: Visit Follow-Up Question             ----- Message from [place] sent at 09/23/2018 1:47 PM EDT -----      ----- Message from Francisco [name] to [name], MD sent at 09/23/2018 1:38 PM -----   Thanks but can you prescribe something for back pain? If so, send to [facility]  ----- Message -----  From: Garret Reddish, MD  Sent: 09/23/2018 8:37 AM EDT  To: Francisco [name]  Subject: RE: Visit Follow-Up Question  I would probably push this out at least 3 months if you are feeling well. We may even need to push back further at that point. You are correct-Francisco [name] is extremely high risk if he were to get covid-19.    Thanks, Garret Reddish      ----- Message -----   From: Francisco [name]   Sent: 09/23/2018 5:42 AM EDT    To: Garret Reddish, MD  Subject: RE: Visit Follow-Up Question    We have appt., [name] and I on 26th. Should we keep appt or reschedule due to his health issues?

## 2018-10-01 ENCOUNTER — Ambulatory Visit: Payer: Medicare Other | Admitting: Family Medicine

## 2018-10-21 ENCOUNTER — Ambulatory Visit: Payer: Medicare Other | Admitting: Internal Medicine

## 2018-10-23 ENCOUNTER — Ambulatory Visit: Payer: Medicare Other | Admitting: Internal Medicine

## 2018-10-29 ENCOUNTER — Encounter: Payer: Self-pay | Admitting: Family Medicine

## 2018-10-30 ENCOUNTER — Ambulatory Visit (INDEPENDENT_AMBULATORY_CARE_PROVIDER_SITE_OTHER): Payer: Medicare Other | Admitting: Family Medicine

## 2018-10-30 ENCOUNTER — Encounter: Payer: Self-pay | Admitting: Family Medicine

## 2018-10-30 VITALS — BP 132/78

## 2018-10-30 DIAGNOSIS — L03115 Cellulitis of right lower limb: Secondary | ICD-10-CM

## 2018-10-30 MED ORDER — CEPHALEXIN 500 MG PO CAPS: 500.0000 mg | ORAL_CAPSULE | Freq: Three times a day (TID) | ORAL | 0 refills | Status: AC

## 2018-10-30 NOTE — Telephone Encounter (Signed)
Pt had appointment this morning.

## 2018-10-30 NOTE — Patient Instructions (Signed)
Video visit

## 2018-10-30 NOTE — Progress Notes (Signed)
Phone [phone]   Subjective:  Virtual visit via Video note. Chief complaint: Chief Complaint  Patient presents with  . Cellulitis   This visit type was conducted due to national recommendations for restrictions regarding the COVID-19 Pandemic (e.g. social distancing).  This format is felt to be most appropriate for this patient at this time balancing risks to patient and risks to population by having him in for in person visit.  No physical exam was performed (except for noted visual exam or audio findings with Telehealth visits).    Our team/I connected with Francisco [name] on 10/30/18 at  8:40 AM EDT by a video enabled telemedicine application ([email or web address]) and verified that I am speaking with the correct person using two identifiers.  Location patient: [place] Location provider: [facility], office Persons participating in the virtual visit:  Patient, wife helps with camera work and provides some history  Our team/I discussed the limitations of evaluation and management by telemedicine and the availability of in person appointments. In light of current covid-19 pandemic, patient also understands that we are trying to protect them by minimizing in office contact if at all possible.  The patient expressed consent for telemedicine visit and agreed to proceed. Patient understands insurance will be billed.   ROS- no fever/chills/nausea/vomiting.  Does have expanding redness on leg with some pain.   Past Medical History-  Patient Active Problem List   Diagnosis Date Noted  . DNR (do not resuscitate) 04/21/2018    Priority: High  . Chronic pulmonary embolism ([place]) 04/07/2018    Priority: High  . Osteoporosis 11/19/2017    Priority: High  . Thoracic compression fracture ([place]) 11/02/2017    Priority: High  . Former smoker 08/23/2008    Priority: High  . 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 Essential tremor 03/31/2018   Priority: Medium  . BPPV (benign paroxysmal positional vertigo), right 02/10/2018    Priority: Medium  . Essential hypertension 11/02/2017    Priority: Medium  . BPH associated with nocturia 06/25/2017    Priority: Medium  . Hyperglycemia 06/12/2016    Priority: Medium  . Hyperlipidemia 07/22/2014    Priority: Medium  . Venous stasis dermatitis of both lower extremities 11/02/2017    Priority: Low  . Chronic respiratory failure with hypoxia ([place]) 09/23/2017    Priority: Low  . Leukocytosis 12/19/2016    Priority: Low  . Onychomycosis 07/22/2014    Priority: Low  . Left ankle swelling 07/22/2014    Priority: Low  . History of skin cancer 05/17/2014    Priority: Low  . Chronic rhinitis 10/28/2012    Priority: Low  . Pulmonary nodule 09/23/2011    Priority: Low  . History of colonic polyps 08/23/2008    Priority: Low  . Diastolic dysfunction 62/22/9798  . Syncope 11/02/2017    Medications- reviewed and updated Current Outpatient Medications  Medication Sig Dispense Refill  . albuterol (VENTOLIN HFA) 108 (90 Base) MCG/ACT inhaler USE 2 PUFFS EVERY 6 HOURS  AS NEEDED FOR WHEEZING 3 Inhaler 3  . azithromycin (ZITHROMAX) 250 MG tablet TAKE 1 TABLET BY MOUTH ON  MONDAY, WEDNESDAY, AND  FRIDAY 39 tablet 1  . benzonatate (TESSALON) 200 MG capsule Take 1 capsule (200 mg total) by mouth 2 (two) times daily as needed for cough. 20 capsule 0  . budesonide-formoterol (SYMBICORT) 160-4.5 MCG/ACT inhaler USE 2 PUFFS TWO TIMES DAILY 30.6 g 2  . Calcium Carbonate-Vit D-Min (CALCIUM  1200 PO) Take 1,200 mg by mouth daily.    . cephALEXin (KEFLEX) 500 MG capsule Take 1 capsule (500 mg total) by mouth 3 (three) times daily for 7 days. 21 capsule 0  . cholecalciferol (VITAMIN D) 1000 units tablet Take 1,000 Units by mouth daily.    . fluticasone (FLONASE) 50 MCG/ACT nasal spray USE 2 SPRAYS IN EACH  NOSTRIL DAILY (Patient taking differently: Place 2 sprays into both nostrils daily. ) 48 g 1  .  furosemide (LASIX) 20 MG tablet Take 1 tablet (20 mg total) by mouth daily as needed for fluid or edema. (Patient not taking: Reported on 07/22/2018) 30 tablet 2  . HYDROcodone-acetaminophen (NORCO/VICODIN) 5-325 MG tablet Take 1-2 tablets by mouth every 4 (four) hours as needed for moderate pain. 15 tablet 0  . Misc. Devices (PULSE OXIMETER FOR FINGER) MISC 1 Device by Does not apply route as needed. 1 each 0  . OXYGEN Inhale 2 L into the lungs See admin instructions. 2 PO2 at rest and 3 PO2 with exertion. Continuous    . predniSONE (DELTASONE) 10 MG tablet Take 1 tablet (10 mg total) by mouth daily with breakfast. 90 tablet 3  . rivaroxaban (XARELTO) 20 MG TABS tablet Take 1 tablet (20 mg total) by mouth daily with supper. 90 tablet 3  . roflumilast (DALIRESP) 500 MCG TABS tablet Take 1 tablet (500 mcg total) by mouth daily. 90 tablet 2  . SPIRIVA RESPIMAT 2.5 MCG/ACT AERS USE 2 INHALATIONS BY MOUTH  ONCE DAILY 12 g 1  . traMADol (ULTRAM) 50 MG tablet Take 1 tablet (50 mg total) by mouth every 6 (six) hours as needed for moderate pain or severe pain. 30 tablet 1   No current facility-administered medications for this visit.      Objective:  BP 132/78   SpO2 97% Comment: on 3 L Gen: NAD, resting comfortably Lungs: nonlabored, normal respiratory rate . Wearing oxygen Skin: appears dry, on right lower leg- patient has an open area with serosanguinous fluid. Picture below is from yesterday-on video areas appears to be more edematous-patient states it is tender to touch.  He does not note extreme warmth on the lower leg.  Area around wound is particularly edematous.  Could not visualize back of leg well. Normal speech      Assessment and Plan   # Cellulitis S:about 6 days ago hit right leg in magazine rack. Leaking fluid since then. Wrapped the area and some of the gauze rubbed back of his leg and that area is tender as well.  Patient has noted some worsening redness around the area.  Pain in  front of the leg is mild-can prior wrap the area on back of the leg is moderate. A/P: Patient appears to have cellulitis of the right lower extremity. - Up-to-date on tetanus shot as of May 2019 - We will start Keflex 3 times a day for 7 days.  I do not strongly suspect MRSA-if he fails to improve we could add doxycycline now as he has tolerated this well in the past -Has a silver collagen dressing at home- small patches- doctor at wound center gave it to him.  He is going to try this directly over the open area - He is going to try Xeroform on the back of the leg and the irritated area -Asked patient to schedule a visit for next week if fails to improve-needs to communicate with [place] if has worsening symptoms over the weekend  Future Appointments  Date Time Provider [place]  12/11/2018  8:40 AM [name], MD [facility]   Lab/Order associations: Cellulitis of right leg  Meds ordered this encounter  Medications  . cephALEXin (KEFLEX) 500 MG capsule    Sig: Take 1 capsule (500 mg total) by mouth 3 (three) times daily for 7 days.    Dispense:  21 capsule    Refill:  0    Return precautions advised.  Garret Reddish, MD

## 2018-11-20 ENCOUNTER — Encounter: Payer: Self-pay | Admitting: Family Medicine

## 2018-11-20 ENCOUNTER — Ambulatory Visit (INDEPENDENT_AMBULATORY_CARE_PROVIDER_SITE_OTHER): Payer: Medicare Other | Admitting: Family Medicine

## 2018-11-20 VITALS — BP 154/89 | HR 73 | Temp 98.2°F | Ht 71.0 in | Wt 165.0 lb

## 2018-11-20 DIAGNOSIS — L03115 Cellulitis of right lower limb: Secondary | ICD-10-CM

## 2018-11-20 DIAGNOSIS — I10 Essential (primary) hypertension: Secondary | ICD-10-CM

## 2018-11-20 DIAGNOSIS — I2782 Chronic pulmonary embolism: Secondary | ICD-10-CM | POA: Diagnosis not present

## 2018-11-20 MED ORDER — FUROSEMIDE 20 MG PO TABS: 20.0000 mg | ORAL_TABLET | Freq: Every day | ORAL | 2 refills | Status: DC | PRN

## 2018-11-20 MED ORDER — CEPHALEXIN 500 MG PO CAPS: 500.0000 mg | ORAL_CAPSULE | Freq: Four times a day (QID) | ORAL | 0 refills | Status: AC

## 2018-11-20 MED ORDER — MUPIROCIN 2 % EX OINT: 1.0000 "application " | TOPICAL_OINTMENT | Freq: Two times a day (BID) | CUTANEOUS | 0 refills | Status: DC

## 2018-11-20 NOTE — Progress Notes (Addendum)
Phone [phone]   Subjective:  Virtual visit via Video note. Chief complaint: Chief Complaint  Patient presents with  . Leg Pain    Rt leg, follow up   This visit type was conducted due to national recommendations for restrictions regarding the COVID-19 Pandemic (e.g. social distancing).  This format is felt to be most appropriate for this patient at this time balancing risks to patient and risks to population by having him in for in person visit.  No physical exam was performed (except for noted visual exam or audio findings with Telehealth visits).    Our team/I connected with Francisco [name] at  3:40 PM EDT by a video enabled telemedicine application (doxy.me or caregility through epic) and verified that I am speaking with the correct person using two identifiers.  Location patient: [place] Location provider: [facility], office Persons participating in the virtual visit:  patient  Our team/I discussed the limitations of evaluation and management by telemedicine and the availability of in person appointments. In light of current covid-19 pandemic, patient also understands that we are trying to protect them by minimizing in office contact if at all possible.  The patient expressed consent for telemedicine visit and agreed to proceed. Patient understands insurance will be billed.   ROS- no fever, chills, nausea, vomiting, redness and pain in right foot-redness that seems to be expanding   Past Medical History-  Patient Active Problem List   Diagnosis Date Noted  . DNR (do not resuscitate) 04/21/2018    Priority: High  . Chronic pulmonary embolism ([place]) 04/07/2018    Priority: High  . Osteoporosis 11/19/2017    Priority: High  . Thoracic compression fracture ([place]) 11/02/2017    Priority: High  . Former smoker 08/23/2008    Priority: High  . 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 Essential tremor 03/31/2018   Priority: Medium  . BPPV (benign paroxysmal positional vertigo), right 02/10/2018    Priority: Medium  . Essential hypertension 11/02/2017    Priority: Medium  . BPH associated with nocturia 06/25/2017    Priority: Medium  . Hyperglycemia 06/12/2016    Priority: Medium  . Hyperlipidemia 07/22/2014    Priority: Medium  . Venous stasis dermatitis of both lower extremities 11/02/2017    Priority: Low  . Chronic respiratory failure with hypoxia ([place]) 09/23/2017    Priority: Low  . Leukocytosis 12/19/2016    Priority: Low  . Onychomycosis 07/22/2014    Priority: Low  . Left ankle swelling 07/22/2014    Priority: Low  . History of skin cancer 05/17/2014    Priority: Low  . Chronic rhinitis 10/28/2012    Priority: Low  . Pulmonary nodule 09/23/2011    Priority: Low  . History of colonic polyps 08/23/2008    Priority: Low  . Diastolic dysfunction 07/10/7251  . Syncope 11/02/2017    Medications- reviewed and updated Current Outpatient Medications  Medication Sig Dispense Refill  . albuterol (VENTOLIN HFA) 108 (90 Base) MCG/ACT inhaler USE 2 PUFFS EVERY 6 HOURS  AS NEEDED FOR WHEEZING 3 Inhaler 3  . azithromycin (ZITHROMAX) 250 MG tablet TAKE 1 TABLET BY MOUTH ON  MONDAY, WEDNESDAY, AND  FRIDAY 39 tablet 1  . benzonatate (TESSALON) 200 MG capsule Take 1 capsule (200 mg total) by mouth 2 (two) times daily as needed for cough. 20 capsule 0  . budesonide-formoterol (SYMBICORT) 160-4.5 MCG/ACT inhaler USE 2 PUFFS TWO TIMES DAILY 30.6 g 2  .  Calcium Carbonate-Vit D-Min (CALCIUM 1200 PO) Take 1,200 mg by mouth daily.    . cholecalciferol (VITAMIN D) 1000 units tablet Take 1,000 Units by mouth daily.    . fluticasone (FLONASE) 50 MCG/ACT nasal spray USE 2 SPRAYS IN EACH  NOSTRIL DAILY (Patient taking differently: Place 2 sprays into both nostrils daily. ) 48 g 1  . furosemide (LASIX) 20 MG tablet Take 1 tablet (20 mg total) by mouth daily as needed for fluid or edema. 30 tablet 2  .  HYDROcodone-acetaminophen (NORCO/VICODIN) 5-325 MG tablet Take 1-2 tablets by mouth every 4 (four) hours as needed for moderate pain. 15 tablet 0  . Misc. Devices (PULSE OXIMETER FOR FINGER) MISC 1 Device by Does not apply route as needed. 1 each 0  . OXYGEN Inhale 2 L into the lungs See admin instructions. 2 PO2 at rest and 3 PO2 with exertion. Continuous    . predniSONE (DELTASONE) 10 MG tablet Take 1 tablet (10 mg total) by mouth daily with breakfast. 90 tablet 3  . rivaroxaban (XARELTO) 20 MG TABS tablet Take 1 tablet (20 mg total) by mouth daily with supper. 90 tablet 3  . roflumilast (DALIRESP) 500 MCG TABS tablet Take 1 tablet (500 mcg total) by mouth daily. 90 tablet 2  . SPIRIVA RESPIMAT 2.5 MCG/ACT AERS USE 2 INHALATIONS BY MOUTH  ONCE DAILY 12 g 1  . traMADol (ULTRAM) 50 MG tablet Take 1 tablet (50 mg total) by mouth every 6 (six) hours as needed for moderate pain or severe pain. 30 tablet 1  . cephALEXin (KEFLEX) 500 MG capsule Take 1 capsule (500 mg total) by mouth 4 (four) times daily for 10 days. 40 capsule 0  . mupirocin ointment (BACTROBAN) 2 % Apply 1 application topically 2 (two) times daily. For 7-10 days 22 g 0   No current facility-administered medications for this visit.      Objective:  BP (!) 154/89 (BP Location: Right Arm, Patient Position: Sitting, Cuff Size: Normal)   Pulse 73   Temp 98.2 F (36.8 C) (Oral)   Ht 5\' 11"  (1.803 m)   Wt 165 lb (74.8 kg)   SpO2 99%   BMI 23.01 kg/m  self reported vitals Gen: NAD, resting comfortably Lungs: nonlabored, normal respiratory rate  Skin: wound on right mid shin appears to be under dime sized- mild surrounding erythema- but does have signficant edema when he pushes on area- when they move down to the foot- appears very edematous and erythematous    Assessment and Plan   # Cellulitis of right foot S:Patients main concern was related to fluid coming out of the mid shin (area that he bumped some time back).  leakage from  hole in leg seemed to slow down with keflex for cellulitis at that area previously.Has some increased warmth at that area. Opening under dime sized-Silver collagen dressing didn't help. . Doing epsom salt wash a few times a day. If doesn't put bandage around it and put in trashbag- gets on sheets at night.  Very slow leak after that- for last 6 days has some tingling and pain around that area- now the toes seem to hurt on that leg- particularly the big toe if he puts pressure on it.   Foot feels puffy and swollen. Taking 1 lasix every other day. Already elevating leg but just at night  Patient's main concerns were increased drainage and then pain in foot when he walks. Top of foot is red, swollen, tender to touch. Bottom  of foot more swollen. Onychomychosis. Redness moving up onto ankle.   Apparently also has small abscess in groin which he did not want to show on camera today.  A/P: Unfortunately looks like he has cellulitis of right foot (reports onychomycosis and we may ultimately need to treat that) weeks after right leg cellulitis resolved.  - will treat with keflex QID for 10 days -Elevate legs -Mupirocin for small abscess in groin - advised follow up early next week to reevaluate -if worsening or systemic symptoms- advised to go to the ER - advised lasix daily for next week to try to decrease edema  Chronic pulmonary embolism ([place]) S: PE in 04/2018 thought provoked due to sedentary activity due to severe COPD- plan was for lifelong therapy as unlikely sedentary activivty will change. Compliant with xarelto A/P: stable- continue current rx. Doubt edema in right leg is related to PE- more likely infection related   #hypertension- elevated today- may be due to some fluid overload and increasing lasix short term. Continue every other day lasix after week of lasix daily.   Future Appointments  Date Time Provider [place]  12/11/2018  8:40 AM [name], MD [facility]    Lab/Order associations: Cellulitis of right foot  Other chronic pulmonary embolism, unspecified whether acute cor pulmonale present [facility])  Essential hypertension  Meds ordered this encounter  Medications  . cephALEXin (KEFLEX) 500 MG capsule    Sig: Take 1 capsule (500 mg total) by mouth 4 (four) times daily for 10 days.    Dispense:  40 capsule    Refill:  0  . furosemide (LASIX) 20 MG tablet    Sig: Take 1 tablet (20 mg total) by mouth daily as needed for fluid or edema.    Dispense:  30 tablet    Refill:  2  . mupirocin ointment (BACTROBAN) 2 %    Sig: Apply 1 application topically 2 (two) times daily. For 7-10 days    Dispense:  22 g    Refill:  0  new acute isue with medication management (prior cellulitis fully resolved)  Return precautions advised.  Francisco Reddish, MD

## 2018-11-20 NOTE — Patient Instructions (Signed)
There are no preventive care reminders to display for this patient.  Depression screen [facility] 2/9 03/31/2018 06/25/2017 06/12/2016  Decreased Interest 0 0 0  Down, Depressed, Hopeless 0 0 0  PHQ - 2 Score 0 0 0  Altered sleeping 0 - -  Tired, decreased energy 0 - -  Change in appetite 0 - -  Feeling bad or failure about yourself  0 - -  Trouble concentrating 0 - -  Moving slowly or fidgety/restless 0 - -  Suicidal thoughts 0 - -  PHQ-9 Score 0 - -  Difficult doing work/chores Not difficult at all - -

## 2018-11-21 NOTE — Assessment & Plan Note (Signed)
S: PE in 04/2018 thought provoked due to sedentary activity due to severe COPD- plan was for lifelong therapy as unlikely sedentary activivty will change. Compliant with xarelto A/P: stable- continue current rx. Doubt edema in right leg is related to PE- more likely infection related

## 2018-11-24 ENCOUNTER — Encounter: Payer: Self-pay | Admitting: Family Medicine

## 2018-11-24 ENCOUNTER — Ambulatory Visit (INDEPENDENT_AMBULATORY_CARE_PROVIDER_SITE_OTHER): Payer: Medicare Other | Admitting: Family Medicine

## 2018-11-24 ENCOUNTER — Ambulatory Visit (INDEPENDENT_AMBULATORY_CARE_PROVIDER_SITE_OTHER)
Admission: RE | Admit: 2018-11-24 | Discharge: 2018-11-24 | Disposition: A | Discharge status: Home / Self Care | Payer: Medicare Other | Source: Ambulatory Visit | Attending: Family Medicine | Admitting: Family Medicine

## 2018-11-24 ENCOUNTER — Other Ambulatory Visit: Payer: Self-pay

## 2018-11-24 VITALS — BP 128/82 | HR 84 | Temp 98.0°F | Ht 71.0 in | Wt 166.0 lb

## 2018-11-24 DIAGNOSIS — I872 Venous insufficiency (chronic) (peripheral): Secondary | ICD-10-CM | POA: Diagnosis not present

## 2018-11-24 DIAGNOSIS — M79674 Pain in right toe(s): Secondary | ICD-10-CM | POA: Diagnosis not present

## 2018-11-24 IMAGING — DX RIGHT GREAT TOE
2 series · 2 of 2 positions shown · non-contrast
Comparison: None.

CLINICAL DATA: First toe pain and swelling, no known injury,
initial encounter

EXAM:
RIGHT GREAT TOE

[toe ap]
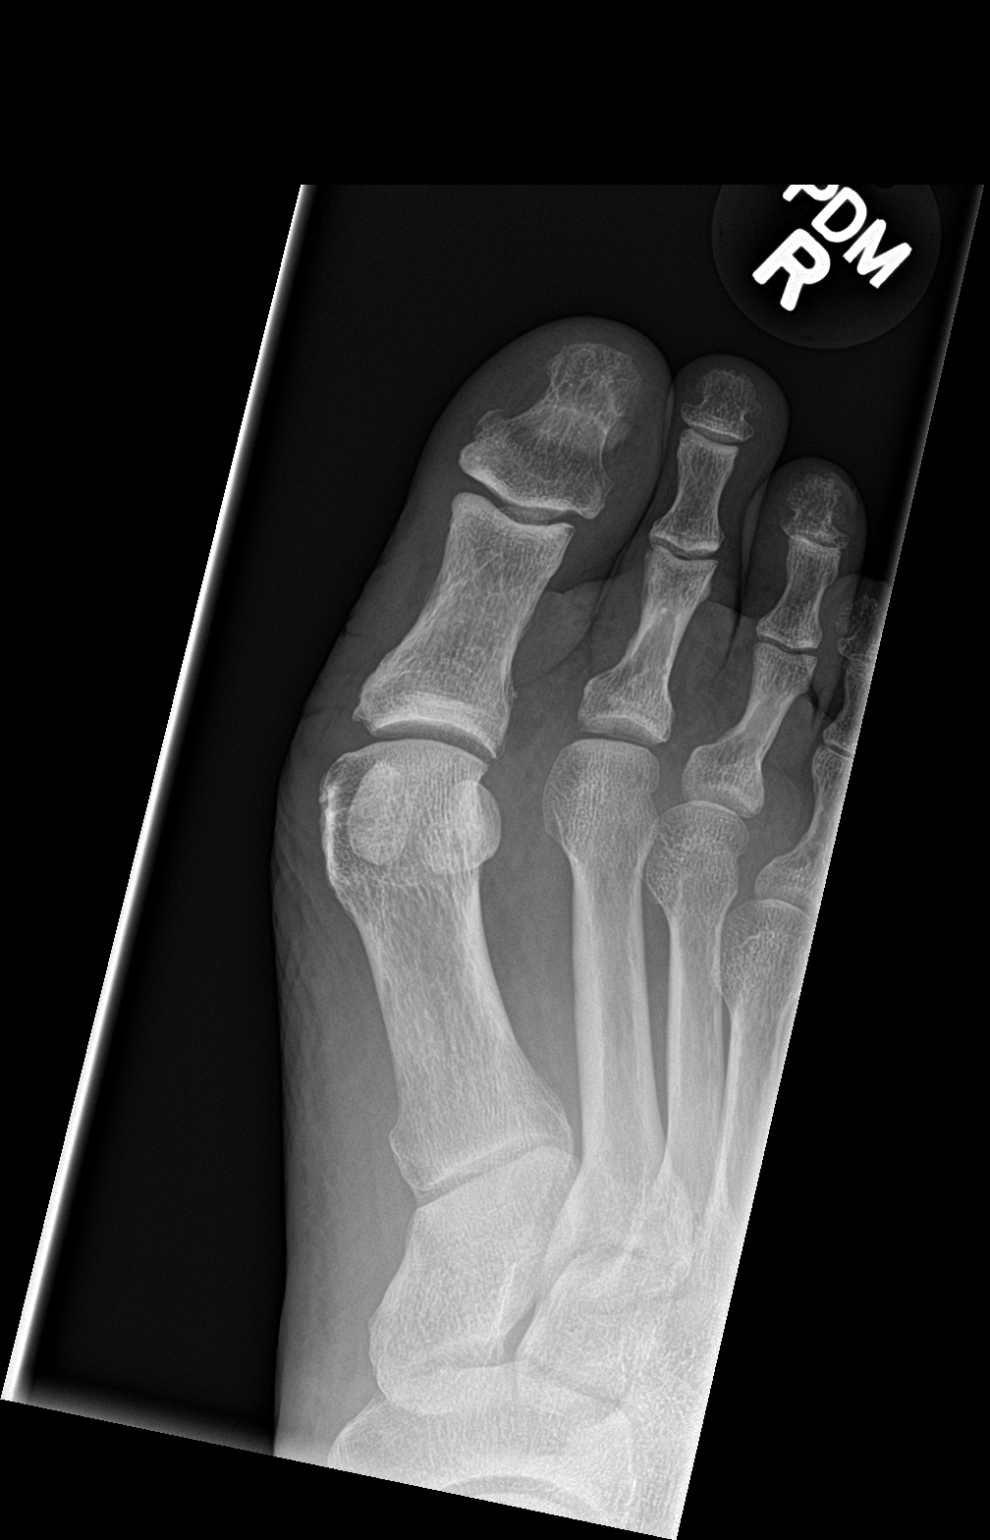

[toe obl]
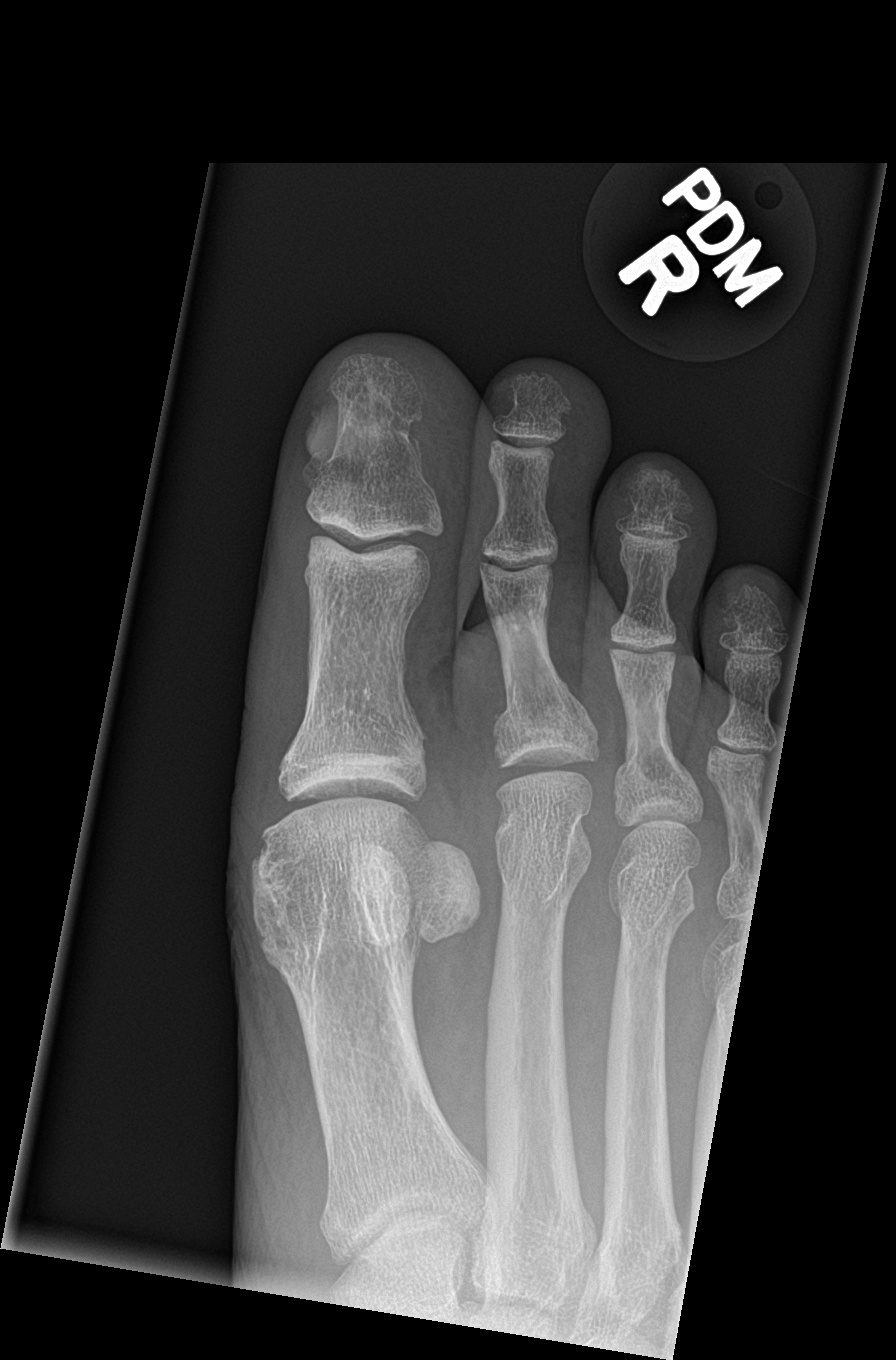

[2 of 2 positions shown; findings below may reference images not displayed]

FINDINGS: Mild cortical irregularity is noted laterally in the midportion of
the distal phalanx although the patient could not tolerate lateral
imaging. This may represent an undisplaced fracture. No other acute
abnormality is noted. Mild hallux valgus deformity is seen.
IMPRESSION: Question undisplaced fracture in the midportion of the first distal
phalanx.

## 2018-11-24 NOTE — Patient Instructions (Addendum)
bp and hr recheck before you go  Leg and foot looks better outside of the toe- toe is the area im most worried about right now. Strongly considering vascular surgery referral- they may ask that we evaluate blood flow first through test such as ABIs  Please go to [facility] - located [address] across [address] from [facility] - in the basement - Hours: 8:30-5:00 PM M-F. Do not need appointment.

## 2018-11-24 NOTE — Progress Notes (Signed)
Phone [phone]   Subjective:  Francisco [name] is a 73 y.o. year old very pleasant male patient who presents for/with See problem oriented charting Chief Complaint  Patient presents with  . Leg Pain    Punture in Right leg from "bumping" it into magazine rack. Pt has done virtual visit  . Toe Injury    right big toe is black with pain not sleeping due to pain   ROS- complains of right great toe pain. No fever, chills, new cough (chronic cough with copd), shortness of breath, body aches, sore throat, or loss of taste or smell   Past Medical History-  Patient Active Problem List   Diagnosis Date Noted  . DNR (do not resuscitate) 04/21/2018    Priority: High  . Chronic pulmonary embolism ([place]) 04/07/2018    Priority: High  . Osteoporosis 11/19/2017    Priority: High  . Thoracic compression fracture ([place]) 11/02/2017    Priority: High  . Former smoker 08/23/2008    Priority: High  . COPD (chronic obstructive pulmonary disease) with emphysema ([place]) 08/23/2008    Priority: High  . Essential tremor 03/31/2018    Priority: Medium  . BPPV (benign paroxysmal positional vertigo), right 02/10/2018    Priority: Medium  . Essential hypertension 11/02/2017    Priority: Medium  . BPH associated with nocturia 06/25/2017    Priority: Medium  . Hyperglycemia 06/12/2016    Priority: Medium  . Hyperlipidemia 07/22/2014    Priority: Medium  . Venous stasis dermatitis of both lower extremities 11/02/2017    Priority: Low  . Chronic respiratory failure with hypoxia ([place]) 09/23/2017    Priority: Low  . Leukocytosis 12/19/2016    Priority: Low  . Onychomycosis 07/22/2014    Priority: Low  . Left ankle swelling 07/22/2014    Priority: Low  . History of skin cancer 05/17/2014    Priority: Low  . Chronic rhinitis 10/28/2012    Priority: Low  . Pulmonary nodule 09/23/2011    Priority: Low  . History of colonic polyps 08/23/2008    Priority: Low  . Diastolic dysfunction 67/89/3810   . Syncope 11/02/2017    Medications- reviewed and updated Current Outpatient Medications  Medication Sig Dispense Refill  . albuterol (VENTOLIN HFA) 108 (90 Base) MCG/ACT inhaler USE 2 PUFFS EVERY 6 HOURS  AS NEEDED FOR WHEEZING 3 Inhaler 3  . azithromycin (ZITHROMAX) 250 MG tablet TAKE 1 TABLET BY MOUTH ON  MONDAY, WEDNESDAY, AND  FRIDAY 39 tablet 1  . budesonide-formoterol (SYMBICORT) 160-4.5 MCG/ACT inhaler USE 2 PUFFS TWO TIMES DAILY 30.6 g 2  . Calcium Carbonate-Vit D-Min (CALCIUM 1200 PO) Take 1,200 mg by mouth daily.    . cholecalciferol (VITAMIN D) 1000 units tablet Take 1,000 Units by mouth daily.    . fluticasone (FLONASE) 50 MCG/ACT nasal spray USE 2 SPRAYS IN EACH  NOSTRIL DAILY (Patient taking differently: Place 2 sprays into both nostrils daily. ) 48 g 1  . furosemide (LASIX) 20 MG tablet Take 1 tablet (20 mg total) by mouth daily as needed for fluid or edema. 30 tablet 2  . Misc. Devices (PULSE OXIMETER FOR FINGER) MISC 1 Device by Does not apply route as needed. 1 each 0  . mupirocin ointment (BACTROBAN) 2 % Apply 1 application topically 2 (two) times daily. For 7-10 days 22 g 0  . OXYGEN Inhale 2 L into the lungs See admin instructions. 2 PO2 at rest and 3 PO2 with exertion. Continuous    . predniSONE (  DELTASONE) 10 MG tablet Take 1 tablet (10 mg total) by mouth daily with breakfast. 90 tablet 3  . rivaroxaban (XARELTO) 20 MG TABS tablet Take 1 tablet (20 mg total) by mouth daily with supper. 90 tablet 3  . roflumilast (DALIRESP) 500 MCG TABS tablet Take 1 tablet (500 mcg total) by mouth daily. 90 tablet 2  . SPIRIVA RESPIMAT 2.5 MCG/ACT AERS USE 2 INHALATIONS BY MOUTH  ONCE DAILY 12 g 1  . traMADol (ULTRAM) 50 MG tablet Take 1 tablet (50 mg total) by mouth every 6 (six) hours as needed for moderate pain or severe pain. 30 tablet 1  . cephALEXin (KEFLEX) 500 MG capsule Take 1 capsule (500 mg total) by mouth 4 (four) times daily for 10 days. 40 capsule 0   No current  facility-administered medications for this visit.      Objective:  BP 128/82   Pulse 84   Temp 98 F (36.7 C) (Oral)   Ht 5\' 11"  (1.803 m)   Wt 166 lb (75.3 kg)   SpO2 98%   BMI 23.15 kg/m  Gen: NAD, wears oxygen- frequently uncomfortable with mask and has to remove CV: slightly tachycardic but regular,  no murmurs rubs or gallops Lungs: CTAB no crackles, wheeze, rhonchi Abdomen: soft/nontender Ext: 2+ edema on left leg, 1+ on the right side. Venous stasis changes bilaterally Skin: warm, dry, right foot very erythematous - edema improved from video visit the other day- great toe with purplish appearance around where it contacts toe 2- toe 2 on right also with slightly purplish appearance.  Weak PT and DP pulses noted         Assessment and Plan   Great toe pain, right - Plan: DG Toe Great Right  Venous stasis dermatitis of both lower extremities S: patient treated for cellulitis of foot 2 days ago by video visit- placed on keflex. Due to impressive edema- also advised to take lasix daily for a week. He was having drainage from a prior wound from April and that frustrated him as well. Tried leg elevation.   With treatment- swelling and redness of foot slightly improved- but unfortunately some pain that was reported in the foot has worsened- particularly the right great toe is very tender in an area that has turned purplish/potentially black A/P: will get x-ray now- concern for possible osteomyelitis. Pulses are weak- considered MRI of toe or vascular studies of leg- with current covid 19 pandemic- I honestly wonder if we could quickly get him into vascular for their evaluation. We are leaning toward vascular evaluation with long term venous insufficiency at baseline and weak pulses on exam today. Considered ER evaluation as well but will start with above studies.   Future Appointments  Date Time Provider [place]  12/11/2018  8:40 AM [name], MD [facility]    Lab/Order associations: Great toe pain, right - Plan: DG Toe Great Right  Venous stasis dermatitis of both lower extremities   Return precautions advised.  Garret Reddish, MD

## 2018-11-25 ENCOUNTER — Ambulatory Visit: Payer: Medicare Other | Admitting: Family

## 2018-11-25 ENCOUNTER — Other Ambulatory Visit: Payer: Self-pay

## 2018-11-25 DIAGNOSIS — M79674 Pain in right toe(s): Secondary | ICD-10-CM

## 2018-11-26 ENCOUNTER — Encounter: Payer: Self-pay | Admitting: Vascular Surgery

## 2018-11-26 ENCOUNTER — Ambulatory Visit (INDEPENDENT_AMBULATORY_CARE_PROVIDER_SITE_OTHER): Payer: Medicare Other | Admitting: Orthopedic Surgery

## 2018-11-26 ENCOUNTER — Encounter: Payer: Self-pay | Admitting: Orthopedic Surgery

## 2018-11-26 ENCOUNTER — Ambulatory Visit (INDEPENDENT_AMBULATORY_CARE_PROVIDER_SITE_OTHER): Payer: Medicare Other | Admitting: Vascular Surgery

## 2018-11-26 ENCOUNTER — Other Ambulatory Visit: Payer: Self-pay

## 2018-11-26 ENCOUNTER — Ambulatory Visit (HOSPITAL_COMMUNITY)
Admission: RE | Admit: 2018-11-26 | Discharge: 2018-11-26 | Disposition: A | Discharge status: Home / Self Care | Payer: Medicare Other | Source: Ambulatory Visit | Attending: Family | Admitting: Family

## 2018-11-26 ENCOUNTER — Telehealth: Payer: Self-pay

## 2018-11-26 VITALS — Ht 71.0 in | Wt 166.0 lb

## 2018-11-26 VITALS — BP 135/90 | HR 78 | Temp 98.3°F | Resp 20 | Ht 71.0 in | Wt 166.0 lb

## 2018-11-26 DIAGNOSIS — I872 Venous insufficiency (chronic) (peripheral): Secondary | ICD-10-CM | POA: Diagnosis not present

## 2018-11-26 DIAGNOSIS — I739 Peripheral vascular disease, unspecified: Secondary | ICD-10-CM

## 2018-11-26 DIAGNOSIS — I771 Stricture of artery: Secondary | ICD-10-CM | POA: Diagnosis not present

## 2018-11-26 NOTE — Progress Notes (Signed)
@Patient  ID: Francisco [name], male    DOB: 02-02-1946, 73 y.o.   MRN: [id number]  Chief Complaint  Patient presents with   Acute Visit    COPD    Referring provider: [name], MD  HPI:  73 year old male former smoker followed in our office for COPD  PMH: Hypertension, osteoporosis, chronic PE, Smoker/ Smoking History: Former smoker.  Quit smoking in 2019.  104-pack-year smoking history. Maintenance: Symbicort 160, Spiriva Respimat 2.5, 10 mg prednisone daily, Monday Wednesday Friday azithromycin Pt of: [name]  11/27/2018  - Visit   73 year old male former smoker presenting to office today for an acute visit.  Patient was seen by [name] a vascular vein specialist yesterday and was felt to be in a COPD exacerbation.  This prompted his evaluation today.  Patient reporting that for the last 2 to 3 weeks he has been using his rescue inhaler more often he averages 1 to 2 puffs daily of his rescue inhaler at baseline he reports that he is up to about 5 to 6 puffs daily over the last 2 to 3 weeks.  He has been compliant and is adherent to Symbicort 160, Spiriva Respimat 2.5, prednisone 10 mg daily, Monday Wednesday Friday azithromycin and is still maintained on Keflex for his lower extremity infection per patient.  Patient also admits that he has been using his as needed furosemide more, he is taken this daily for the last 8 days.  Unfortunately he does not weigh himself regularly at home.  Weights are stable over the last month but they are up about 5 to 6 pounds in comparison to 3 to 4 months ago.  Patient admits that over the last 2 to 3 weeks he has had more labored breathing but has not had increased oxygen needs.  He reports that he is still using his baseline 2 to 3 L of O2 at home.  3 L with exertion.  Patient admits he is had a worsened cough which is occasionally productive.  At baseline patient does not cough.  MMRC - Breathlessness Score 4 - I am too breathless to  leave the house or I am breathlessness when dressing    Tests:   04/07/2018-CTA- nonocclusive PE centrally involving branches of the right middle lobe and right lower lobe, no evidence of right heart strain  11/04/2017-echocardiogram-LV ejection fraction of 65 to 82%, grade 1 diastolic dysfunction  10/29/5359-[id number] function test- FVC 4.59 (92% predicted), postbronchodilator ratio 29, postbronchodilator FEV1 1.41 (40% predicted), DLCO 72  11/27/2018-chest x-ray- no active cardiopulmonary disease, COPD  FENO:  No results found for: NITRICOXIDE  PFT: No flowsheet data found.  Imaging: Dg Chest 2 View  Result Date: 11/27/2018 CLINICAL DATA:  Cough and worsening shortness of breath. EXAM: CHEST - 2 VIEW COMPARISON:  Chest x-ray dated July 13, 2018. FINDINGS: The heart size and mediastinal contours are within normal limits. Normal pulmonary vascularity. The lungs remain hyperinflated with emphysematous changes. Unchanged right apical pleuroparenchymal scarring. No focal consolidation, pleural effusion, or pneumothorax. No acute osseous abnormality. Multiple chronic moderate to severe midthoracic vertebral body compression deformities again noted. IMPRESSION: 1.  No active cardiopulmonary disease. 2. COPD. Electronically Signed   By: Titus Dubin M.D.   On: 11/27/2018 10:42   Dg Toe Great Right  Result Date: 11/25/2018 CLINICAL DATA:  First toe pain and swelling, no known injury, initial encounter EXAM: RIGHT GREAT TOE COMPARISON:  None. FINDINGS: Mild cortical irregularity is noted laterally in the midportion of  the distal phalanx although the patient could not tolerate lateral imaging. This may represent an undisplaced fracture. No other acute abnormality is noted. Mild hallux valgus deformity is seen. IMPRESSION: Question undisplaced fracture in the midportion of the first distal phalanx. Electronically Signed   By: Inez Catalina M.D.   On: 11/25/2018 08:45   Vas [place] Burnard Bunting With/wo  Tbi  Result Date: 11/26/2018 LOWER EXTREMITY DOPPLER STUDY Indications: Claudication, and peripheral artery disease. High Risk Factors: Hypertension, hyperlipidemia, past history of smoking.  Performing Technologist: Delorise Shiner RVT  Examination Guidelines: A complete evaluation includes at minimum, Doppler waveform signals and systolic blood pressure reading at the level of bilateral brachial, anterior tibial, and posterior tibial arteries, when vessel segments are accessible. Bilateral testing is considered an integral part of a complete examination. Photoelectric Plethysmograph (PPG) waveforms and toe systolic pressure readings are included as required and additional duplex testing as needed. Limited examinations for reoccurring indications may be performed as noted.  ABI Findings: +---------+------------------+-----+----------+--------+  Right     Rt Pressure (mmHg) Index Waveform   Comment   +---------+------------------+-----+----------+--------+  Brachial  179                                           +---------+------------------+-----+----------+--------+  ATA       83                 0.44  monophasic           +---------+------------------+-----+----------+--------+  PTA       93                 0.49  monophasic           +---------+------------------+-----+----------+--------+  Great Toe 0                  0.00                       +---------+------------------+-----+----------+--------+ +---------+------------------+-----+---------+-------+  Left      Lt Pressure (mmHg) Index Waveform  Comment  +---------+------------------+-----+---------+-------+  Brachial  190                                         +---------+------------------+-----+---------+-------+  ATA       136                0.72  triphasic          +---------+------------------+-----+---------+-------+  PTA       160                0.84  biphasic           +---------+------------------+-----+---------+-------+  Great Toe 80                  0.42                     +---------+------------------+-----+---------+-------+ +-------+-----------+-----------+------------+------------+  ABI/TBI Today's ABI Today's TBI Previous ABI Previous TBI  +-------+-----------+-----------+------------+------------+  Right   0.49        0.0                                    +-------+-----------+-----------+------------+------------+  Left    0.84        0.42                                   +-------+-----------+-----------+------------+------------+  Summary: Right: Resting right ankle-brachial index indicates severe right lower extremity arterial disease. The right toe-brachial index is abnormal. RT great toe pressure = 0 mmHg. Left: Resting left ankle-brachial index indicates mild left lower extremity arterial disease. The left toe-brachial index is abnormal. LT Great toe pressure = 80 mmHg.  *See table(s) above for measurements and observations.  Electronically signed by Ruta Hinds MD on 11/26/2018 at 2:52:27 PM.   Final       Specialty Problems      Pulmonary Problems   COPD (chronic obstructive pulmonary disease) with emphysema ([place])     PFT's 09/2009:  FEV1 1.20 (34%), ratio 26, increase 17% with BD, +++airtrapping, DLCO 72% Suspect due to emphysema and asthma AAT level 2012:  Normal level, M? (unidentified allelle on testing).   Referred to pulmonary rehab 08/2011. Sinusitis often flares underlying lung disease.  ++response to singulair trial Unable to tolerate daliresp Trial of azithro MWF 05/2014 >> pt thinks this has helped him Continued pipe smoking >> significant inhalation per patient. Nuclear stress test 2013 normal, echo ok.  Ambulatory ox 06/2014:  No desat below 90%.  Low dose chronic prednisone started 05/2014       Pulmonary nodule    Ct chest 2013:  Multiple bilat pulmonary nodules. CT chest 02/2012:  Significant decrease in size of RUL nodule, LLL nodule unchanged.  Needs f/u 02/2013.  CT chest 05/2013:  No nodules seen.        Chronic rhinitis    F/u [facility]/ ENT - Sinus  CT 08/01/09 Bilateral maxillary sinusitis with some mucosal thickening in the  sphenoid and frontal sinuses as well.       Chronic respiratory failure with hypoxia (HCC)   COPD with acute exacerbation (HCC)   Stage 4 very severe COPD by GOLD classification (HCC)      Allergies  Allergen Reactions   Tape Other (See Comments)    SKIN IS VERY THIN; CAN ONLY USE COBAN WRAPS DUE TO CONDITION OF SKIN!!   Levaquin [Levofloxacin]     hallucinations    Immunization History  Administered Date(s) Administered   Influenza Split 04/24/2012   Influenza Whole 05/08/2009, 04/08/2011   Influenza, High Dose Seasonal PF 05/01/2017, 03/31/2018   Influenza,inj,Quad PF,6+ Mos 04/27/2013, 04/15/2014, 04/24/2015   Influenza,inj,quad, With Preservative 05/01/2018   Influenza-Unspecified 05/13/2016   Pneumococcal Conjugate-13 02/25/2014   Pneumococcal Polysaccharide-23 08/15/2011, 12/19/2016   Pneumococcal-Unspecified 03/08/2017   Td 02/21/2010   Tdap 11/05/2017   Zoster 01/29/2012    Past Medical History:  Diagnosis Date   Chest x-ray abnormality    Chronic back pain    "mid and lower" (04/07/2018)   Chronic rhinitis    -Sinus Ct 08/01/2009 >> Bilateral maxillary sinusitis with some mucosal thickeningin the sphenoid and frontal sinuses as well with air fluid levels present -chronic rhinitis flyer Aug 04, 2009   Compressed spine fracture [facility])    COPD (chronic obstructive pulmonary disease) ([place])    PFT's rec Jul 17, 2009   Emphysema lung [facility])    On home oxygen therapy    "3L; 24/7" (04/07/2018)   Onychomycosis    Dr. Judi Cong   Orthostatic hypotension    "since 10/2017" (04/07/2018)   Pneumonia    "  twice in 1 year" (04/07/2018)   Pulmonary embolism ([place]) 04/07/2018   Skin cancer    "lips, face, ears, arms" (04/07/2018)   Vertigo    "since ~ 02/2018" (04/07/2018)    Tobacco History: Social History   Tobacco  Use  Smoking Status Former Smoker   Packs/day: 2.00   Years: 52.00   Pack years: 104.00   Types: Pipe, Cigarettes   Start date: 10/06/1965   Last attempt to quit: 10/06/2017   Years since quitting: 1.1  Smokeless Tobacco Never Used  Tobacco Comment   04/07/2018 "smoked cigarettes years ago; stopped ~ 20 yr ago; stopped smoking pipe in 09/2017"   Counseling given: Yes Comment: 04/07/2018 "smoked cigarettes years ago; stopped ~ 20 yr ago; stopped smoking pipe in 09/2017"  Continue to not smoke  Outpatient Encounter Medications as of 11/27/2018  Medication Sig   albuterol (VENTOLIN HFA) 108 (90 Base) MCG/ACT inhaler USE 2 PUFFS EVERY 6 HOURS  AS NEEDED FOR WHEEZING   azithromycin (ZITHROMAX) 250 MG tablet TAKE 1 TABLET BY MOUTH ON  MONDAY, WEDNESDAY, AND  FRIDAY   budesonide-formoterol (SYMBICORT) 160-4.5 MCG/ACT inhaler USE 2 PUFFS TWO TIMES DAILY   Calcium Carbonate-Vit D-Min (CALCIUM 1200 PO) Take 1,200 mg by mouth daily.   cephALEXin (KEFLEX) 500 MG capsule Take 1 capsule (500 mg total) by mouth 4 (four) times daily for 10 days.   cholecalciferol (VITAMIN D) 1000 units tablet Take 1,000 Units by mouth daily.   fluticasone (FLONASE) 50 MCG/ACT nasal spray USE 2 SPRAYS IN EACH  NOSTRIL DAILY (Patient taking differently: Place 2 sprays into both nostrils daily. )   furosemide (LASIX) 20 MG tablet Take 1 tablet (20 mg total) by mouth daily as needed for fluid or edema.   Misc. Devices (PULSE OXIMETER FOR FINGER) MISC 1 Device by Does not apply route as needed.   mupirocin ointment (BACTROBAN) 2 % Apply 1 application topically 2 (two) times daily. For 7-10 days   OXYGEN Inhale 2 L into the lungs See admin instructions. 2 PO2 at rest and 3 PO2 with exertion. Continuous   predniSONE (DELTASONE) 10 MG tablet Take 1 tablet (10 mg total) by mouth daily with breakfast.   rivaroxaban (XARELTO) 20 MG TABS tablet Take 1 tablet (20 mg total) by mouth daily with supper.   roflumilast  (DALIRESP) 500 MCG TABS tablet Take 1 tablet (500 mcg total) by mouth daily.   SPIRIVA RESPIMAT 2.5 MCG/ACT AERS USE 2 INHALATIONS BY MOUTH  ONCE DAILY   traMADol (ULTRAM) 50 MG tablet Take 1 tablet (50 mg total) by mouth every 6 (six) hours as needed for moderate pain or severe pain.   predniSONE (DELTASONE) 10 MG tablet 4 tabs for 2 days, then 3 tabs for 2 days, 2 tabs for 2 days, then 1 tab for 2 days, then stop   No facility-administered encounter medications on file as of 11/27/2018.      Review of Systems  Review of Systems  Constitutional: Positive for fatigue. Negative for activity change, chills, fever and unexpected weight change.  HENT: Negative for congestion, postnasal drip, rhinorrhea, sneezing and sore throat.   Eyes: Negative.   Respiratory: Positive for cough, shortness of breath and wheezing.   Cardiovascular: Negative for chest pain and palpitations.  Gastrointestinal: Negative for constipation, diarrhea, nausea and vomiting.  Endocrine: Negative.   Musculoskeletal: Negative.   Skin:       Being followed by [facility] for le wounds  Neurological: Negative for dizziness and headaches.  Psychiatric/Behavioral:  Negative.  Negative for dysphoric mood. The patient is not nervous/anxious.   All other systems reviewed and are negative.    Physical Exam  BP 132/62 (BP Location: Right Arm, Cuff Size: Normal)    Pulse (!) 105    Temp 97.8 F (36.6 C) (Oral)    Ht 6' (1.829 m)    Wt 165 lb (74.8 kg)    SpO2 97%    BMI 22.38 kg/m   Wt Readings from Last 5 Encounters:  11/27/18 165 lb (74.8 kg)  11/26/18 166 lb (75.3 kg)  11/26/18 166 lb (75.3 kg)  11/24/18 166 lb (75.3 kg)  11/20/18 165 lb (74.8 kg)    Physical Exam  Constitutional: He is oriented to person, place, and time and well-developed, well-nourished, and in no distress. No distress.  HENT:  Head: Normocephalic and atraumatic.  Right Ear: Hearing, tympanic membrane, external ear and ear canal normal.  Left Ear:  Hearing, tympanic membrane, external ear and ear canal normal.  Nose: Mucosal edema present. Right sinus exhibits no maxillary sinus tenderness and no frontal sinus tenderness. Left sinus exhibits no maxillary sinus tenderness and no frontal sinus tenderness.  Mouth/Throat: Uvula is midline and oropharynx is clear and moist. No oropharyngeal exudate.  Eyes: Pupils are equal, round, and reactive to light.  Neck: Normal range of motion. Neck supple. No JVD present.  Cardiovascular: Normal rate, regular rhythm and normal heart sounds.  Pulmonary/Chest: Effort normal. No accessory muscle usage. No respiratory distress. He has no decreased breath sounds. He has wheezes (exp wheeze ). He has no rhonchi.  Abdominal: Soft. Bowel sounds are normal. He exhibits no distension. There is no abdominal tenderness.  Musculoskeletal: Normal range of motion.        General: No edema.  Lymphadenopathy:    He has no cervical adenopathy.  Neurological: He is alert and oriented to person, place, and time. Gait normal.  Skin: Skin is warm and dry. Bruising noted. He is not diaphoretic.     Bruising on hands bilaterally  Psychiatric: Mood, memory, affect and judgment normal.  Nursing note and vitals reviewed.    Lab Results:  CBC    Component Value Date/Time   WBC 12.7 (H) 04/21/2018 1232   RBC 4.19 (L) 04/21/2018 1232   HGB 14.0 04/21/2018 1232   HCT 40.4 04/21/2018 1232   PLT 271.0 04/21/2018 1232   MCV 96.5 04/21/2018 1232   MCH 33.3 04/08/2018 0430   MCHC 34.5 04/21/2018 1232   RDW 13.3 04/21/2018 1232   LYMPHSABS 0.4 (L) 04/07/2018 1146   MONOABS 0.3 04/07/2018 1146   EOSABS 0.0 04/07/2018 1146   BASOSABS 0.1 04/07/2018 1146    BMET    Component Value Date/Time   NA 137 04/21/2018 1232   K 4.3 04/21/2018 1232   CL 100 04/21/2018 1232   CO2 27 04/21/2018 1232   GLUCOSE 99 04/21/2018 1232   BUN 15 04/21/2018 1232   CREATININE 0.98 04/21/2018 1232   CALCIUM 8.9 04/21/2018 1232    GFRNONAA >60 04/08/2018 0430   GFRAA >60 04/08/2018 0430    BNP    Component Value Date/Time   BNP 83.9 11/02/2017 1957    ProBNP    Component Value Date/Time   PROBNP 50.0 04/03/2018 1148      Assessment & Plan:   COPD with acute exacerbation (HCC) Assessment: Maintained on Symbicort 160, 10 mg of prednisone daily, Monday Wednesday Friday azithromycin as well as Spiriva Respimat 2.5 2 to 3 weeks of increased  shortness of breath, wheezing, cough mMRC 4 today Expiratory wheezes persist throughout entire lung exam  Plan: Continue Monday Wednesday Friday azithromycin Continue Keflex as outlined by [facility] Prednisone taper today then resume daily 10 mg prednisone Follow-up with our office in 2 weeks to ensure improvement of symptoms    COPD (chronic obstructive pulmonary disease) with emphysema ([place]) Assessment: 104-pack-year smoking history 2011 PFTs showed an FEV1 1.41 (40% predicted) Maintained on Monday Wednesday Friday azithromycin Maintained on Symbicort 160 as well as Spiriva Respimat 2.5 Maintained on 10 mg prednisone daily Worsening shortness of breath, cough, wheezing over the last 2 to 3 weeks Lung sounds on exam reveal a persistent expiratory wheeze  Plan: Chest x-ray today, this is clear Prednisone taper today Continue Symbicort 160 and Spiriva Respimat inhalers Resume 10 mg prednisone daily after completing prednisone taper Continue Monday Wednesday Friday azithromycin Continue Daliresp Continue to work on daily exercise Follow-up with our office in 2 weeks to ensure improvement of symptoms  Chronic respiratory failure with hypoxia ([place]) Plan: Continue oxygen therapy as prescribed  Former smoker Assessment: Former smoker 104-pack-year smoking history Quit smoking in 2019  Plan: Continue to not smoke We will defer lung cancer screening at this time due to patient's severe COPD  Chronic pulmonary embolism ([place]) Plan: Continue  Xarelto    Return in about 2 weeks (around 12/11/2018), or if symptoms worsen or fail to improve, for Follow up with Dr. Purnell Shoemaker, Follow up with [name] FNP-C.   Lauraine Rinne, NP 11/27/2018   This appointment was 32 minutes long with over 50% of the time in direct face-to-face patient care, assessment, plan of care, and follow-up.

## 2018-11-26 NOTE — Progress Notes (Signed)
Referring Physician: Dr Sharol Given  Patient name: Francisco [name] MRN: [id number] DOB: 06-23-1946 Sex: male  REASON FOR CONSULT: Nonhealing wound right leg  HPI: Francisco [name] is a 73 y.o. male, referred by Dr. Sharol Given for nonhealing wound right leg.  The patient scuffed his right pretibial area on a piece of furniture several weeks ago.  It has failed to heal.  Initially he had significant serous drainage from the wound.  He has had chronic lower extremity edema for several years.  Additionally he has severe COPD.  He is on continuous home oxygen at 3 L.  He is usually sitting in a wheelchair with legs in a dependent position frequently.  He states that recently he has started to develop numbness and tingling in both feet.  This has progressed over the last few months.  Additionally in the right leg he has pain in the right first toe and across the tops of the toes in his right foot especially at nighttime.  This is slightly improved by dangling the foot down.  He does not really ambulate enough to have claudication symptoms.  He did have a pulmonary embolus in the past and is on chronic Xarelto therapy.  He has chronic left leg swelling.  He has tried to wear some compression stockings in the past but has not been able to do this due to the pain of getting the stockings on his legs.  He denies any fever or chills.  He states he has been getting progressively more short of breath over the last few days like a typical COPD exacerbation.  He has been coughing more.  Past Medical History:  Diagnosis Date  . Chest x-ray abnormality   . Chronic back pain    "mid and lower" (04/07/2018)  . Chronic rhinitis    -Sinus Ct 08/01/2009 >> Bilateral maxillary sinusitis with some mucosal thickeningin the sphenoid and frontal sinuses as well with air fluid levels present -chronic rhinitis flyer Aug 04, 2009  . Compressed spine fracture ([place])   . COPD (chronic obstructive pulmonary disease) [facility])    PFT's rec Jul 17, 2009  . Emphysema lung ([place])   . On home oxygen therapy    "3L; 24/7" (04/07/2018)  . Onychomycosis    Dr. Judi Cong  . Orthostatic hypotension    "since 10/2017" (04/07/2018)  . Pneumonia    "twice in 1 year" (04/07/2018)  . Pulmonary embolism ([place]) 04/07/2018  . Skin cancer    "lips, face, ears, arms" (04/07/2018)  . Vertigo    "since ~ 02/2018" (04/07/2018)   Past Surgical History:  Procedure Laterality Date  . APPENDECTOMY    . SKIN CANCER EXCISION     "lips, face, ears, arms" (04/07/2018)  . TONSILLECTOMY      Family History  Problem Relation Age of Onset  . Heart disease Mother        CABG in her 17s, nonsmoker  . Cancer Mother   . Stroke Father   . Heart disease Father        Died of MI at age 29, smoker  . Hepatitis Sister   . Coronary artery disease Other        male 1st degree relative <60    SOCIAL HISTORY: Social History   Socioeconomic History  . Marital status: Married    Spouse name: Not on file  . Number of children: 2  . Years of education: 3 years of college  . Highest education  level: Not on file  Occupational History    Employer: [employer]  . Financial resource strain: Not on file  . Food insecurity:    Worry: Not on file    Inability: Not on file  . Transportation needs:    Medical: Not on file    Non-medical: Not on file  Tobacco Use  . Smoking status: Former Smoker    Packs/day: 2.00    Years: 51.00    Pack years: 102.00    Types: Pipe, Cigarettes    Start date: 10/06/2017  . Smokeless tobacco: Never Used  . Tobacco comment: 04/07/2018 "smoked cigarettes years ago; stopped ~ 20 yr ago; stopped smoking pipe in 09/2017"  Substance and Sexual Activity  . Alcohol use: Yes    Alcohol/week: 28.0 standard drinks    Types: 28 Cans of beer per week    Comment: "04/07/2018 "4-5 beers/day; probably closer to 28/wk"  . Drug use: No  . Sexual activity: Never  Lifestyle  . Physical activity:    Days per week: Not on file    Minutes  per session: Not on file  . Stress: Not on file  Relationships  . Social connections:    Talks on phone: Not on file    Gets together: Not on file    Attends religious service: Not on file    Active member of club or organization: Not on file    Attends meetings of clubs or organizations: Not on file    Relationship status: Not on file  . Intimate partner violence:    Fear of current or ex partner: Not on file    Emotionally abused: Not on file    Physically abused: Not on file    Forced sexual activity: Not on file  Other Topics Concern  . Not on file  Social History Narrative   Married (wife Katy Apo patient of Dr. Yong Channel) with children (2-son and daughter). 1 grandson.       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       Hobbies: fishing, projects at home    Allergies  Allergen Reactions  . Tape Other (See Comments)    SKIN IS VERY THIN; CAN ONLY USE COBAN WRAPS DUE TO CONDITION OF SKIN!!  . Levaquin [Levofloxacin]     hallucinations    Current Outpatient Medications  Medication Sig Dispense Refill  . albuterol (VENTOLIN HFA) 108 (90 Base) MCG/ACT inhaler USE 2 PUFFS EVERY 6 HOURS  AS NEEDED FOR WHEEZING 3 Inhaler 3  . azithromycin (ZITHROMAX) 250 MG tablet TAKE 1 TABLET BY MOUTH ON  MONDAY, WEDNESDAY, AND  FRIDAY 39 tablet 1  . budesonide-formoterol (SYMBICORT) 160-4.5 MCG/ACT inhaler USE 2 PUFFS TWO TIMES DAILY 30.6 g 2  . Calcium Carbonate-Vit D-Min (CALCIUM 1200 PO) Take 1,200 mg by mouth daily.    . cephALEXin (KEFLEX) 500 MG capsule Take 1 capsule (500 mg total) by mouth 4 (four) times daily for 10 days. 40 capsule 0  . cholecalciferol (VITAMIN D) 1000 units tablet Take 1,000 Units by mouth daily.    . fluticasone (FLONASE) 50 MCG/ACT nasal spray USE 2 SPRAYS IN EACH  NOSTRIL DAILY (Patient taking differently: Place 2 sprays into both nostrils daily. ) 48 g 1  . furosemide (LASIX) 20 MG tablet Take 1 tablet (20 mg total) by mouth daily  as needed for fluid or edema. 30 tablet 2  . Misc. Devices (PULSE OXIMETER FOR FINGER) MISC 1  Device by Does not apply route as needed. 1 each 0  . mupirocin ointment (BACTROBAN) 2 % Apply 1 application topically 2 (two) times daily. For 7-10 days 22 g 0  . OXYGEN Inhale 2 L into the lungs See admin instructions. 2 PO2 at rest and 3 PO2 with exertion. Continuous    . predniSONE (DELTASONE) 10 MG tablet Take 1 tablet (10 mg total) by mouth daily with breakfast. 90 tablet 3  . rivaroxaban (XARELTO) 20 MG TABS tablet Take 1 tablet (20 mg total) by mouth daily with supper. 90 tablet 3  . roflumilast (DALIRESP) 500 MCG TABS tablet Take 1 tablet (500 mcg total) by mouth daily. 90 tablet 2  . SPIRIVA RESPIMAT 2.5 MCG/ACT AERS USE 2 INHALATIONS BY MOUTH  ONCE DAILY 12 g 1  . traMADol (ULTRAM) 50 MG tablet Take 1 tablet (50 mg total) by mouth every 6 (six) hours as needed for moderate pain or severe pain. 30 tablet 1   No current facility-administered medications for this visit.     ROS:   General:  No weight loss, Fever, chills  HEENT: No recent headaches, no nasal bleeding, no visual changes, no sore throat  Neurologic: No dizziness, blackouts, seizures. No recent symptoms of stroke or mini- stroke. No recent episodes of slurred speech, or temporary blindness.  Cardiac: No recent episodes of chest pain/pressure, + shortness of breath at rest.  + shortness of breath with exertion.  Denies history of atrial fibrillation or irregular heartbeat  Vascular: No history of rest pain in feet.  No history of claudication.  + history of non-healing ulcer, + history of DVT   Pulmonary: + home oxygen, + productive cough, no hemoptysis,  + asthma or wheezing  Musculoskeletal:  [ ]  Arthritis, [ ]  Low back pain,  [ ]  Joint pain  Hematologic:No history of hypercoagulable state.  No history of easy bleeding.  No history of anemia  Gastrointestinal: No hematochezia or melena,  No gastroesophageal reflux, no  trouble swallowing  Urinary: [ ]  chronic Kidney disease, [ ]  on HD - [ ]  MWF or [ ]  TTHS, [ ]  Burning with urination, [ ]  Frequent urination, [ ]  Difficulty urinating;   Skin: No rashes  Psychological: No history of anxiety,  No history of depression   Physical Examination  There were no vitals filed for this visit.  There is no height or weight on file to calculate BMI.  General:  Alert and oriented, no acute distress, obviously increased work of breathing HEENT: Normal Neck: No JVD Pulmonary: Distant breath sounds with occasional wheeze bilaterally Cardiac: Regular Rate and Rhythm tachycardic Abdomen: Soft, non-tender, non-distended, no mass Skin: No rash, 2.5 cm ulceration with dark base right pretibial region, dusky medial portion right first toe no open wound on the foot, dusky bluish appearance of both lower extremities extending from the knee into the foot bilaterally         Extremity Pulses:  2+ radial, brachial, femoral, absent dorsalis pedis, posterior tibial pulses bilaterally Musculoskeletal: 1+ bilateral lower extremity edema left greater than right  Neurologic: Upper and lower extremity motor 5/5 and symmetric  DATA:   bilateral ABIs performed today which were 0.4 on the right 0.8 on the left  ASSESSMENT: #1 severe COPD with severe pulmonary dysfunction which is most likely contributing to his lower extremity edema and bluish congested appearance of his legs.  I believe this is secondary to underlying severe pulmonary dysfunction with cor pulmonale.  We will arrange for  him to see [name] or 1 of his partners tomorrow to see if this is acute severe COPD exacerbation and whether or not he may even require hospitalization to tune up his pulmonary function.  Patient denies any fever or chills but has developed increasing cough so certainly could be at risk for COVID.  2.  Peripheral arterial disease the patient most likely has multilevel right lower extremity  arterial occlusive disease and milder disease on the left side.  This may be contributing somewhat to the pretibial ulcer not healing but usually pretibial ulcers will heal with a moderate amount of perfusion.  The patient is not an open operative candidate due to his severe underlying lung dysfunction.  We will try to schedule him for aortogram lower extremity runoff to see if he is a candidate for some type of percutaneous intervention to improve perfusion to his right first toe and the pretibial region to get this to heal.  We will schedule this for Friday, December 11, 2018.  This will be dependent on improvement of his underlying pulmonary dysfunction.  3.  Venous dysfunction most likely combination of deep and superficial venous reflux with dependent edema.  The edema is contributing to the nonhealing pretibial wound.  We placed the patient in Ace wraps from the foot to the knee bilaterally to control his edema symptoms for now.  This should assist some in wound healing.  Patient needs long-term compression at least with Ace wraps if he cannot tolerate lower extremity compression stockings.   PLAN: See above   Ruta Hinds, MD [facility] Office: [phone] Pager: [phone]

## 2018-11-26 NOTE — Telephone Encounter (Signed)
Patient was in the office today and needed a STAT referral to [facility]. I called and sw Christy. She made the appt with Dr. Eden Lathe and also for ABI studies bilaterally for mixed venous and arterial insufficiency and black toes on the right. I faxed over the referral and office note from PCP and referral, also made a copy for the pt to take with him to the appt. He has left the office and is on his way now. Fax # [phone].

## 2018-11-26 NOTE — H&P (View-Only) (Signed)
Referring Physician: Dr Sharol Given  Patient name: Francisco [name] MRN: [id number] DOB: 06-23-1946 Sex: male  REASON FOR CONSULT: Nonhealing wound right leg  HPI: Francisco [name] is a 73 y.o. male, referred by Dr. Sharol Given for nonhealing wound right leg.  The patient scuffed his right pretibial area on a piece of furniture several weeks ago.  It has failed to heal.  Initially he had significant serous drainage from the wound.  He has had chronic lower extremity edema for several years.  Additionally he has severe COPD.  He is on continuous home oxygen at 3 L.  He is usually sitting in a wheelchair with legs in a dependent position frequently.  He states that recently he has started to develop numbness and tingling in both feet.  This has progressed over the last few months.  Additionally in the right leg he has pain in the right first toe and across the tops of the toes in his right foot especially at nighttime.  This is slightly improved by dangling the foot down.  He does not really ambulate enough to have claudication symptoms.  He did have a pulmonary embolus in the past and is on chronic Xarelto therapy.  He has chronic left leg swelling.  He has tried to wear some compression stockings in the past but has not been able to do this due to the pain of getting the stockings on his legs.  He denies any fever or chills.  He states he has been getting progressively more short of breath over the last few days like a typical COPD exacerbation.  He has been coughing more.  Past Medical History:  Diagnosis Date  . Chest x-ray abnormality   . Chronic back pain    "mid and lower" (04/07/2018)  . Chronic rhinitis    -Sinus Ct 08/01/2009 >> Bilateral maxillary sinusitis with some mucosal thickeningin the sphenoid and frontal sinuses as well with air fluid levels present -chronic rhinitis flyer Aug 04, 2009  . Compressed spine fracture ([place])   . COPD (chronic obstructive pulmonary disease) [facility])    PFT's rec Jul 17, 2009  . Emphysema lung ([place])   . On home oxygen therapy    "3L; 24/7" (04/07/2018)  . Onychomycosis    Dr. Judi Cong  . Orthostatic hypotension    "since 10/2017" (04/07/2018)  . Pneumonia    "twice in 1 year" (04/07/2018)  . Pulmonary embolism ([place]) 04/07/2018  . Skin cancer    "lips, face, ears, arms" (04/07/2018)  . Vertigo    "since ~ 02/2018" (04/07/2018)   Past Surgical History:  Procedure Laterality Date  . APPENDECTOMY    . SKIN CANCER EXCISION     "lips, face, ears, arms" (04/07/2018)  . TONSILLECTOMY      Family History  Problem Relation Age of Onset  . Heart disease Mother        CABG in her 17s, nonsmoker  . Cancer Mother   . Stroke Father   . Heart disease Father        Died of MI at age 29, smoker  . Hepatitis Sister   . Coronary artery disease Other        male 1st degree relative <60    SOCIAL HISTORY: Social History   Socioeconomic History  . Marital status: Married    Spouse name: Not on file  . Number of children: 2  . Years of education: 3 years of college  . Highest education  level: Not on file  Occupational History    Employer: [employer]  . Financial resource strain: Not on file  . Food insecurity:    Worry: Not on file    Inability: Not on file  . Transportation needs:    Medical: Not on file    Non-medical: Not on file  Tobacco Use  . Smoking status: Former Smoker    Packs/day: 2.00    Years: 51.00    Pack years: 102.00    Types: Pipe, Cigarettes    Start date: 10/06/2017  . Smokeless tobacco: Never Used  . Tobacco comment: 04/07/2018 "smoked cigarettes years ago; stopped ~ 20 yr ago; stopped smoking pipe in 09/2017"  Substance and Sexual Activity  . Alcohol use: Yes    Alcohol/week: 28.0 standard drinks    Types: 28 Cans of beer per week    Comment: "04/07/2018 "4-5 beers/day; probably closer to 28/wk"  . Drug use: No  . Sexual activity: Never  Lifestyle  . Physical activity:    Days per week: Not on file    Minutes  per session: Not on file  . Stress: Not on file  Relationships  . Social connections:    Talks on phone: Not on file    Gets together: Not on file    Attends religious service: Not on file    Active member of club or organization: Not on file    Attends meetings of clubs or organizations: Not on file    Relationship status: Not on file  . Intimate partner violence:    Fear of current or ex partner: Not on file    Emotionally abused: Not on file    Physically abused: Not on file    Forced sexual activity: Not on file  Other Topics Concern  . Not on file  Social History Narrative   Married (wife Katy Apo patient of Dr. Yong Channel) with children (2-son and daughter). 1 grandson.       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       Hobbies: fishing, projects at home    Allergies  Allergen Reactions  . Tape Other (See Comments)    SKIN IS VERY THIN; CAN ONLY USE COBAN WRAPS DUE TO CONDITION OF SKIN!!  . Levaquin [Levofloxacin]     hallucinations    Current Outpatient Medications  Medication Sig Dispense Refill  . albuterol (VENTOLIN HFA) 108 (90 Base) MCG/ACT inhaler USE 2 PUFFS EVERY 6 HOURS  AS NEEDED FOR WHEEZING 3 Inhaler 3  . azithromycin (ZITHROMAX) 250 MG tablet TAKE 1 TABLET BY MOUTH ON  MONDAY, WEDNESDAY, AND  FRIDAY 39 tablet 1  . budesonide-formoterol (SYMBICORT) 160-4.5 MCG/ACT inhaler USE 2 PUFFS TWO TIMES DAILY 30.6 g 2  . Calcium Carbonate-Vit D-Min (CALCIUM 1200 PO) Take 1,200 mg by mouth daily.    . cephALEXin (KEFLEX) 500 MG capsule Take 1 capsule (500 mg total) by mouth 4 (four) times daily for 10 days. 40 capsule 0  . cholecalciferol (VITAMIN D) 1000 units tablet Take 1,000 Units by mouth daily.    . fluticasone (FLONASE) 50 MCG/ACT nasal spray USE 2 SPRAYS IN EACH  NOSTRIL DAILY (Patient taking differently: Place 2 sprays into both nostrils daily. ) 48 g 1  . furosemide (LASIX) 20 MG tablet Take 1 tablet (20 mg total) by mouth daily  as needed for fluid or edema. 30 tablet 2  . Misc. Devices (PULSE OXIMETER FOR FINGER) MISC 1  Device by Does not apply route as needed. 1 each 0  . mupirocin ointment (BACTROBAN) 2 % Apply 1 application topically 2 (two) times daily. For 7-10 days 22 g 0  . OXYGEN Inhale 2 L into the lungs See admin instructions. 2 PO2 at rest and 3 PO2 with exertion. Continuous    . predniSONE (DELTASONE) 10 MG tablet Take 1 tablet (10 mg total) by mouth daily with breakfast. 90 tablet 3  . rivaroxaban (XARELTO) 20 MG TABS tablet Take 1 tablet (20 mg total) by mouth daily with supper. 90 tablet 3  . roflumilast (DALIRESP) 500 MCG TABS tablet Take 1 tablet (500 mcg total) by mouth daily. 90 tablet 2  . SPIRIVA RESPIMAT 2.5 MCG/ACT AERS USE 2 INHALATIONS BY MOUTH  ONCE DAILY 12 g 1  . traMADol (ULTRAM) 50 MG tablet Take 1 tablet (50 mg total) by mouth every 6 (six) hours as needed for moderate pain or severe pain. 30 tablet 1   No current facility-administered medications for this visit.     ROS:   General:  No weight loss, Fever, chills  HEENT: No recent headaches, no nasal bleeding, no visual changes, no sore throat  Neurologic: No dizziness, blackouts, seizures. No recent symptoms of stroke or mini- stroke. No recent episodes of slurred speech, or temporary blindness.  Cardiac: No recent episodes of chest pain/pressure, + shortness of breath at rest.  + shortness of breath with exertion.  Denies history of atrial fibrillation or irregular heartbeat  Vascular: No history of rest pain in feet.  No history of claudication.  + history of non-healing ulcer, + history of DVT   Pulmonary: + home oxygen, + productive cough, no hemoptysis,  + asthma or wheezing  Musculoskeletal:  [ ]  Arthritis, [ ]  Low back pain,  [ ]  Joint pain  Hematologic:No history of hypercoagulable state.  No history of easy bleeding.  No history of anemia  Gastrointestinal: No hematochezia or melena,  No gastroesophageal reflux, no  trouble swallowing  Urinary: [ ]  chronic Kidney disease, [ ]  on HD - [ ]  MWF or [ ]  TTHS, [ ]  Burning with urination, [ ]  Frequent urination, [ ]  Difficulty urinating;   Skin: No rashes  Psychological: No history of anxiety,  No history of depression   Physical Examination  There were no vitals filed for this visit.  There is no height or weight on file to calculate BMI.  General:  Alert and oriented, no acute distress, obviously increased work of breathing HEENT: Normal Neck: No JVD Pulmonary: Distant breath sounds with occasional wheeze bilaterally Cardiac: Regular Rate and Rhythm tachycardic Abdomen: Soft, non-tender, non-distended, no mass Skin: No rash, 2.5 cm ulceration with dark base right pretibial region, dusky medial portion right first toe no open wound on the foot, dusky bluish appearance of both lower extremities extending from the knee into the foot bilaterally         Extremity Pulses:  2+ radial, brachial, femoral, absent dorsalis pedis, posterior tibial pulses bilaterally Musculoskeletal: 1+ bilateral lower extremity edema left greater than right  Neurologic: Upper and lower extremity motor 5/5 and symmetric  DATA:   bilateral ABIs performed today which were 0.4 on the right 0.8 on the left  ASSESSMENT: #1 severe COPD with severe pulmonary dysfunction which is most likely contributing to his lower extremity edema and bluish congested appearance of his legs.  I believe this is secondary to underlying severe pulmonary dysfunction with cor pulmonale.  We will arrange for  him to see [name] or 1 of his partners tomorrow to see if this is acute severe COPD exacerbation and whether or not he may even require hospitalization to tune up his pulmonary function.  Patient denies any fever or chills but has developed increasing cough so certainly could be at risk for COVID.  2.  Peripheral arterial disease the patient most likely has multilevel right lower extremity  arterial occlusive disease and milder disease on the left side.  This may be contributing somewhat to the pretibial ulcer not healing but usually pretibial ulcers will heal with a moderate amount of perfusion.  The patient is not an open operative candidate due to his severe underlying lung dysfunction.  We will try to schedule him for aortogram lower extremity runoff to see if he is a candidate for some type of percutaneous intervention to improve perfusion to his right first toe and the pretibial region to get this to heal.  We will schedule this for Friday, December 11, 2018.  This will be dependent on improvement of his underlying pulmonary dysfunction.  3.  Venous dysfunction most likely combination of deep and superficial venous reflux with dependent edema.  The edema is contributing to the nonhealing pretibial wound.  We placed the patient in Ace wraps from the foot to the knee bilaterally to control his edema symptoms for now.  This should assist some in wound healing.  Patient needs long-term compression at least with Ace wraps if he cannot tolerate lower extremity compression stockings.   PLAN: See above   Ruta Hinds, MD [facility] Office: [phone] Pager: [phone]

## 2018-11-26 NOTE — Progress Notes (Signed)
Office Visit Note   Patient: Francisco [name]           Date of Birth: [birth year]           MRN: [id number] Visit Date: 11/26/2018              Requested by: [name], MD [place], [place] [id number] PCP: [name], MD  Chief Complaint  Patient presents with  . Left Leg - Pain, Edema  . 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  Patient has had treatment for the cellulitis in his legs with Keflex he states he has a few days remaining.  Patient recently has x-rays performed this week.  Patient states he does have weeping drainage from his legs.  He is on 20 mg of Lasix for the past 7 days.  Patient does have COPD he is on 3 L of nasal cannula oxygen he states his O2 sats usually run around 96%.  Assessment & Plan: Visit Diagnoses:  1. Venous insufficiency   2. Arterial insufficiency (HCC)     Plan: Patient has critical limb ischemia of the right lower extremity we will send him over to vascular vein surgery at this time.  Follow-Up Instructions: Return if symptoms worsen or fail to improve.   Ortho Exam  Patient is alert, oriented, no adenopathy, well-dressed, normal affect, normal respiratory effort. Examination patient's legs feel more comfortable dependent than elevated.  He has pitting edema in both lower extremities with brawny skin color changes out to the toes.  There is dry gangrenous changes of all the toes on the right foot.  His right foot is cooler to the touch than the left foot.  I cannot palpate pulses.  A Doppler was used and he has biphasic dorsalis pedis and posterior tibial pulse on the left on the right foot he has a dampened monophasic posterior tibial pulse and I cannot Doppler a dorsalis pedis pulse on the right.  Imaging: No results found. No images are attached to the  encounter.  Labs: Lab Results  Component Value Date   REPTSTATUS 01/23/2009 FINAL 12/22/2008   REPTSTATUS 12/23/2008 FINAL 12/22/2008   GRAMSTAIN  12/22/2008    RARE WBC PRESENT, PREDOMINANTLY PMN NO SQUAMOUS EPITHELIAL CELLS SEEN NO ORGANISMS SEEN   CULT  12/22/2008    YEAST CONSISTENT WITH CANDIDA SPECIES TRICHOSPORON SPECIES     Lab Results  Component Value Date   ALBUMIN 4.1 04/21/2018   ALBUMIN 4.3 12/25/2017   ALBUMIN 3.3 (L) 11/02/2017    Body mass index is 23.15 kg/m.  Orders:  Orders Placed This Encounter  Procedures  . Ambulatory referral to Vascular Surgery   No orders of the defined types were placed in this encounter.    Procedures: No procedures performed  Clinical Data: No additional findings.  ROS:  All other systems negative, except as noted in the HPI. Review of Systems  Objective: Vital Signs: Ht 5\' 11"  (1.803 m)   Wt 166 lb (75.3 kg)   BMI 23.15 kg/m   Specialty Comments:  No specialty comments available.  PMFS History: Patient Active Problem List   Diagnosis Date Noted  . DNR (do not resuscitate) 04/21/2018  . Diastolic dysfunction 46/56/8127  . Chronic pulmonary embolism ([place]) 04/07/2018  . Essential tremor 03/31/2018  .  BPPV (benign paroxysmal positional vertigo), right 02/10/2018  . Osteoporosis 11/19/2017  . Syncope 11/02/2017  . Thoracic compression fracture ([place]) 11/02/2017  . Essential hypertension 11/02/2017  . Venous stasis dermatitis of both lower extremities 11/02/2017  . Chronic respiratory failure with hypoxia ([place]) 09/23/2017  . BPH associated with nocturia 06/25/2017  . Leukocytosis 12/19/2016  . Hyperglycemia 06/12/2016  . Hyperlipidemia 07/22/2014  . Onychomycosis 07/22/2014  . Left ankle swelling 07/22/2014  . History of skin cancer 05/17/2014  . Chronic rhinitis 10/28/2012  . Pulmonary nodule 09/23/2011  . Former smoker 08/23/2008  . COPD (chronic obstructive pulmonary disease) with emphysema ([place])  08/23/2008  . History of colonic polyps 08/23/2008   Past Medical History:  Diagnosis Date  . Chest x-ray abnormality   . Chronic back pain    "mid and lower" (04/07/2018)  . Chronic rhinitis    -Sinus Ct 08/01/2009 >> Bilateral maxillary sinusitis with some mucosal thickeningin the sphenoid and frontal sinuses as well with air fluid levels present -chronic rhinitis flyer Aug 04, 2009  . Compressed spine fracture ([place])   . COPD (chronic obstructive pulmonary disease) [facility])    PFT's rec Jul 17, 2009  . Emphysema lung ([place])   . On home oxygen therapy    "3L; 24/7" (04/07/2018)  . Onychomycosis    Dr. Judi Cong  . Orthostatic hypotension    "since 10/2017" (04/07/2018)  . Pneumonia    "twice in 1 year" (04/07/2018)  . Pulmonary embolism ([place]) 04/07/2018  . Skin cancer    "lips, face, ears, arms" (04/07/2018)  . Vertigo    "since ~ 02/2018" (04/07/2018)    Family History  Problem Relation Age of Onset  . Heart disease Mother        CABG in her 58s, nonsmoker  . Cancer Mother   . Stroke Father   . Heart disease Father        Died of MI at age 53, smoker  . Hepatitis Sister   . Coronary artery disease Other        male 1st degree relative <60    Past Surgical History:  Procedure Laterality Date  . APPENDECTOMY    . SKIN CANCER EXCISION     "lips, face, ears, arms" (04/07/2018)  . TONSILLECTOMY     Social History   Occupational History    Employer: [employer]  Tobacco Use  . Smoking status: Former Smoker    Packs/day: 2.00    Years: 51.00    Pack years: 102.00    Types: Pipe, Cigarettes    Start date: 10/06/2017  . Smokeless tobacco: Never Used  . Tobacco comment: 04/07/2018 "smoked cigarettes years ago; stopped ~ 20 yr ago; stopped smoking pipe in 09/2017"  Substance and Sexual Activity  . Alcohol use: Yes    Alcohol/week: 28.0 standard drinks    Types: 28 Cans of beer per week    Comment: "04/07/2018 "4-5 beers/day; probably closer to 28/wk"  . Drug use: No  . Sexual  activity: Never

## 2018-11-27 ENCOUNTER — Telehealth: Payer: Self-pay | Admitting: Pulmonary Disease

## 2018-11-27 ENCOUNTER — Encounter: Payer: Self-pay | Admitting: Pulmonary Disease

## 2018-11-27 ENCOUNTER — Institutional Professional Consult (permissible substitution): Payer: Medicare Other | Admitting: Internal Medicine

## 2018-11-27 ENCOUNTER — Ambulatory Visit (INDEPENDENT_AMBULATORY_CARE_PROVIDER_SITE_OTHER): Payer: Medicare Other | Admitting: Pulmonary Disease

## 2018-11-27 ENCOUNTER — Other Ambulatory Visit: Payer: Self-pay | Admitting: *Deleted

## 2018-11-27 ENCOUNTER — Telehealth: Payer: Self-pay | Admitting: *Deleted

## 2018-11-27 ENCOUNTER — Ambulatory Visit (INDEPENDENT_AMBULATORY_CARE_PROVIDER_SITE_OTHER): Payer: Medicare Other

## 2018-11-27 VITALS — BP 132/62 | HR 105 | Temp 97.8°F | Ht 72.0 in | Wt 165.0 lb

## 2018-11-27 DIAGNOSIS — J438 Other emphysema: Secondary | ICD-10-CM

## 2018-11-27 DIAGNOSIS — J441 Chronic obstructive pulmonary disease with (acute) exacerbation: Secondary | ICD-10-CM

## 2018-11-27 DIAGNOSIS — J449 Chronic obstructive pulmonary disease, unspecified: Secondary | ICD-10-CM | POA: Insufficient documentation

## 2018-11-27 DIAGNOSIS — Z87891 Personal history of nicotine dependence: Secondary | ICD-10-CM | POA: Diagnosis not present

## 2018-11-27 DIAGNOSIS — J9611 Chronic respiratory failure with hypoxia: Secondary | ICD-10-CM | POA: Diagnosis not present

## 2018-11-27 DIAGNOSIS — I2782 Chronic pulmonary embolism: Secondary | ICD-10-CM

## 2018-11-27 IMAGING — DX CHEST - 2 VIEW
2 series · 2 of 2 positions shown · non-contrast
Comparison: Chest x-ray dated [DATE].

CLINICAL DATA: Cough and worsening shortness of breath.

EXAM:
CHEST - 2 VIEW

[chest pa]
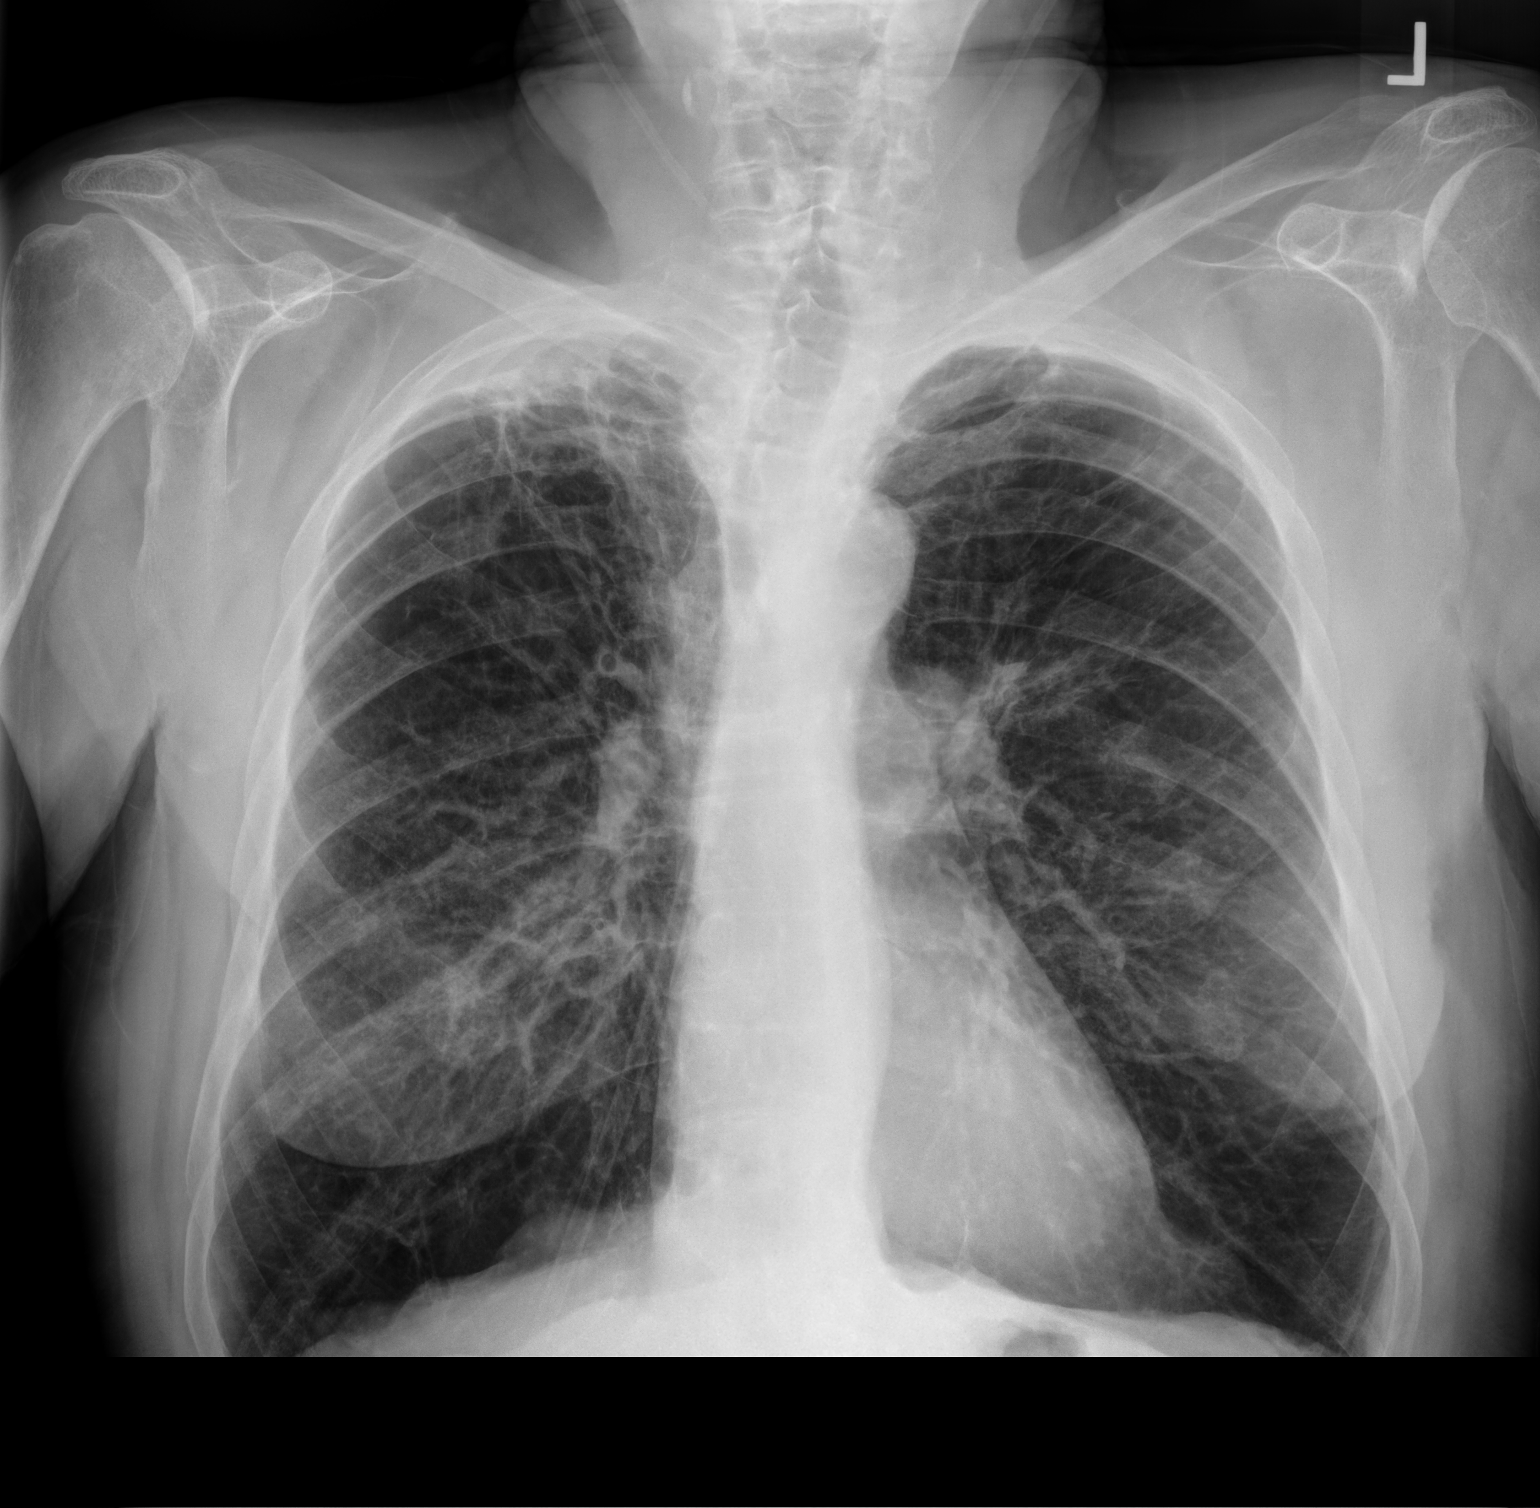

[chest lat]
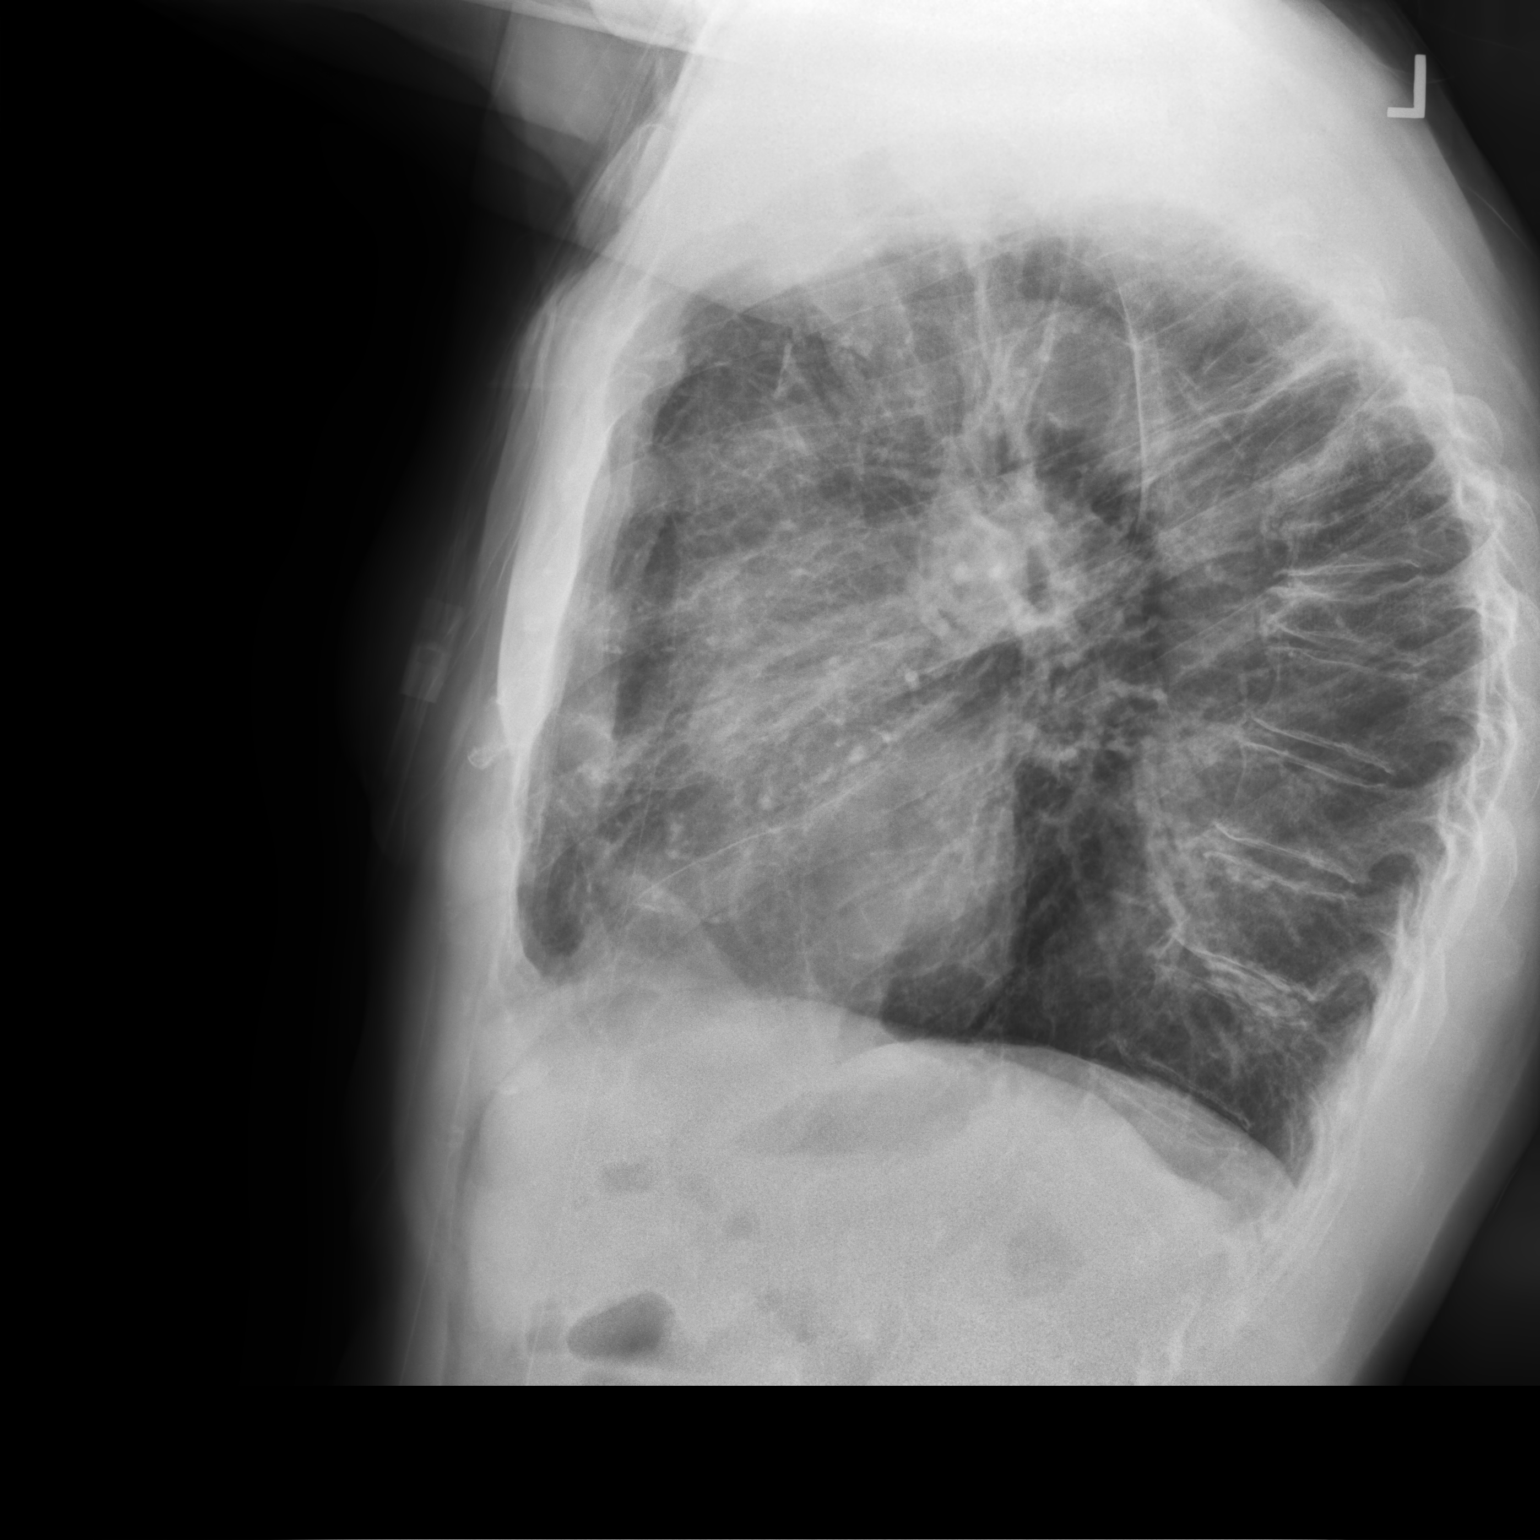

[2 of 2 positions shown; findings below may reference images not displayed]

FINDINGS: The heart size and mediastinal contours are within normal limits.
Normal pulmonary vascularity. The lungs remain hyperinflated with
emphysematous changes. Unchanged right apical pleuroparenchymal
scarring. No focal consolidation, pleural effusion, or pneumothorax.
No acute osseous abnormality. Multiple chronic moderate to severe
midthoracic vertebral body compression deformities again noted.
IMPRESSION: 1.  No active cardiopulmonary disease.
2. COPD.

## 2018-11-27 MED ORDER — PREDNISONE 10 MG PO TABS: ORAL_TABLET | ORAL | 0 refills | Status: DC

## 2018-11-27 NOTE — Telephone Encounter (Signed)
Call and spoke with patient and wife with pre-procedure instructions. Instructed to be at [facility] admitting at 8:00 am on 12/11/2018. Hold Xarelto x 1 day pre-procedure.  NPO past MN night prior and must have a driver and caregiver for discharge to home. Take all regular scheduled am inhalers before leaving for the hospital. Take rescue inhaler with him to hospital. Hold all other medications until after this procedure. Reviewed timeline of procedure, and visitor restrictions. Appointment time given for Covid testing. Verbalized understanding. To call this office if questions.

## 2018-11-27 NOTE — Assessment & Plan Note (Signed)
Assessment: 104-pack-year smoking history 2011 PFTs showed an FEV1 1.41 (40% predicted) Maintained on Monday Wednesday Friday azithromycin Maintained on Symbicort 160 as well as Spiriva Respimat 2.5 Maintained on 10 mg prednisone daily Worsening shortness of breath, cough, wheezing over the last 2 to 3 weeks Lung sounds on exam reveal a persistent expiratory wheeze  Plan: Chest x-ray today, this is clear Prednisone taper today Continue Symbicort 160 and Spiriva Respimat inhalers Resume 10 mg prednisone daily after completing prednisone taper Continue Monday Wednesday Friday azithromycin Continue Daliresp Continue to work on daily exercise Follow-up with our office in 2 weeks to ensure improvement of symptoms

## 2018-11-27 NOTE — Assessment & Plan Note (Signed)
Assessment: Maintained on Symbicort 160, 10 mg of prednisone daily, Monday Wednesday Friday azithromycin as well as Spiriva Respimat 2.5 2 to 3 weeks of increased shortness of breath, wheezing, cough mMRC 4 today Expiratory wheezes persist throughout entire lung exam  Plan: Continue Monday Wednesday Friday azithromycin Continue Keflex as outlined by [facility] Prednisone taper today then resume daily 10 mg prednisone Follow-up with our office in 2 weeks to ensure improvement of symptoms

## 2018-11-27 NOTE — Patient Instructions (Addendum)
Chest Xray today   Prednisone 10mg  tablet  >>>4 tabs for 2 days, then 3 tabs for 2 days, 2 tabs for 2 days, then resume standard 10mg  daily dose  >>>take with food  >>>take in the morning   Continue Symbicort 160 >>> 2 puffs in the morning right when you wake up, rinse out your mouth after use, 12 hours later 2 puffs, rinse after use >>> Take this daily, no matter what >>> This is not a rescue inhaler   Continue Spiriva Respimat 2.5 >>> 2 puffs daily >>> Do this every day >>>This is not a rescue inhaler  Continue daliresp daily as prescribed   Continue Monday Wednesday Friday azithromycin  Continue Keflex as prescribed  Continue Xarelto as prescribed   Note your daily symptoms > remember "red flags" for COPD:   >>>Increase in cough >>>increase in sputum production >>>increase in shortness of breath or activity  intolerance.   If you notice these symptoms, please call the office to be seen.    Coronavirus (COVID-19) Are you at risk?  Are you at risk for the Coronavirus (COVID-19)?  To be considered HIGH RISK for Coronavirus (COVID-19), you have to meet the following criteria:  . Traveled to [place], [place], [place], [place] or [place]; or in the [place] to [place], [place], [place], or [place]; and have fever, cough, and shortness of breath within the last 2 weeks of travel OR . Been in close contact with a person diagnosed with COVID-19 within the last 2 weeks and have fever, cough, and shortness of breath . IF YOU DO NOT MEET THESE CRITERIA, YOU ARE CONSIDERED LOW RISK FOR COVID-19.  What to do if you are HIGH RISK for COVID-19?  [name] Kitchen If you are having a medical emergency, call 911. . Seek medical care right away. Before you go to a doctor's office, urgent care or emergency department, call ahead and tell them about your recent travel, contact with someone diagnosed with COVID-19, and your symptoms. You should receive instructions from your physician's office  regarding next steps of care.  . When you arrive at healthcare provider, tell the healthcare staff immediately you have returned from visiting [place], [place], [place], [place] or [place]; or traveled in the [place] to [place], [place], [place], or [place]; in the last two weeks or you have been in close contact with a person diagnosed with COVID-19 in the last 2 weeks.   . Tell the health care staff about your symptoms: fever, cough and shortness of breath. . After you have been seen by a medical provider, you will be either: o Tested for (COVID-19) and discharged home on quarantine except to seek medical care if symptoms worsen, and asked to  - Stay home and avoid contact with others until you get your results (4-5 days)  - Avoid travel on public transportation if possible (such as bus, train, or airplane) or o Sent to the Emergency Department by EMS for evaluation, COVID-19 testing, and possible admission depending on your condition and test results.  What to do if you are LOW RISK for COVID-19?  Reduce your risk of any infection by using the same precautions used for avoiding the common cold or flu:  [name] Kitchen Wash your hands often with soap and warm water for at least 20 seconds.  If soap and water are not readily available, use an alcohol-based hand sanitizer with at least 60% alcohol.  . If coughing or sneezing, cover your  mouth and nose by coughing or sneezing into the elbow areas of your shirt or coat, into a tissue or into your sleeve (not your hands). . Avoid shaking hands with others and consider head nods or verbal greetings only. . Avoid touching your eyes, nose, or mouth with unwashed hands.  . Avoid close contact with people who are sick. . Avoid places or events with large numbers of people in one location, like concerts or sporting events. . Carefully consider travel plans you have or are making. . If you are planning any travel outside or inside the [place], visit the CDC's  Travelers' Health webpage for the latest health notices. . If you have some symptoms but not all symptoms, continue to monitor at home and seek medical attention if your symptoms worsen. . If you are having a medical emergency, call 911.   [place] / e-Visit: [email or web address]         [facility]: [facility]: [phone]                   [facility]: [phone]           It is flu season:   >>> Best ways to protect herself from the flu: Receive the yearly flu vaccine, practice good hand hygiene washing with soap and also using hand sanitizer when available, eat a nutritious meals, get adequate rest, hydrate appropriately   Please contact the office if your symptoms worsen or you have concerns that you are not improving.   Thank you for choosing [facility] for your healthcare, and for allowing [place] to partner with you on your healthcare journey. I am thankful to be able to provide care to you today.   [name] FNP-C         Chronic Obstructive Pulmonary Disease Exacerbation Chronic obstructive pulmonary disease (COPD) is a long-term (chronic) lung problem. In COPD, the flow of air from the lungs is limited. COPD exacerbations are times that breathing gets worse and you need more than your normal treatment. Without treatment, they can be life threatening. If they happen often, your lungs can become more damaged. If your COPD gets worse, your doctor may treat you with:  Medicines.  Oxygen.  Different ways to clear your airway, such as using a mask. Follow these instructions at home: Medicines  Take over-the-counter and prescription medicines only as told by your doctor.  If you take an antibiotic or steroid medicine, do not stop taking the medicine even if you start to feel better.  Keep up with  shots (vaccinations) as told by your doctor. Be sure to get a yearly (annual) flu shot. Lifestyle  Do not smoke. If you need help quitting, ask your doctor.  Eat healthy foods.  Exercise regularly.  Get plenty of sleep.  Avoid tobacco smoke and other things that can bother your lungs.  Wash your hands often with soap and water. This will help keep you from getting an infection. If you cannot use soap and water, use hand sanitizer.  During flu season, avoid areas that are crowded with people. General instructions  Drink enough fluid to keep your pee (urine) clear or pale yellow. Do not do this if your doctor has told you not to.  Use a cool mist machine (vaporizer).  If you use oxygen or a machine that turns medicine into a mist (nebulizer), continue to use it  as told.  Follow all instructions for rehabilitation. These are steps you can take to make your body work better.  Keep all follow-up visits as told by your doctor. This is important. Contact a doctor if:  Your COPD symptoms get worse than normal. Get help right away if:  You are short of breath and it gets worse.  You have trouble talking.  You have chest pain.  You cough up blood.  You have a fever.  You keep throwing up (vomiting).  You feel weak or you pass out (faint).  You feel confused.  You are not able to sleep because of your symptoms.  You are not able to do daily activities. Summary  COPD exacerbations are times that breathing gets worse and you need more treatment than normal.  COPD exacerbations can be very serious and may cause your lungs to become more damaged.  Do not smoke. If you need help quitting, ask your doctor.  Stay up-to-date on your shots. Get a flu shot every year. This information is not intended to replace advice given to you by your health care provider. Make sure you discuss any questions you have with your health care provider. Document Released: 06/13/2011 Document  Revised: 07/29/2016 Document Reviewed: 07/29/2016 Elsevier Interactive Patient Education  2019 [employer].

## 2018-11-27 NOTE — Progress Notes (Signed)
Your chest x-ray results of come back.  Showing no acute changes. Chronically showing COPD. No Pneumonia or effusion, this is good news. No plan of care changes at this time.  Keep follow-up appointment.    Follow-up with our office if symptoms worsen or you do not feel like you are improving under her current regimen.  It was a pleasure taking care of you,  [name], FNP

## 2018-11-27 NOTE — Assessment & Plan Note (Signed)
Plan: Continue Xarelto 

## 2018-11-27 NOTE — Telephone Encounter (Signed)
Becky returned my call. I stated to her that I have faxed over clearance form as well as OV from today to her and also stated to her that [name] was okay with pt having the procedure but was coming back in two weeks due to being in a current flare. Stated to [facility] that the appt was to check on improvement after meds prescribed. Also stated to [facility] that pt will need to have guidance on when to hold Xarelto and Jacqlyn Larsen stated that she did go over all instructions with pt today. Nothing further needed.

## 2018-11-27 NOTE — Assessment & Plan Note (Signed)
Assessment: Former smoker 104-pack-year smoking history Quit smoking in 2019  Plan: Continue to not smoke We will defer lung cancer screening at this time due to patient's severe COPD

## 2018-11-27 NOTE — Assessment & Plan Note (Signed)
Plan: Continue oxygen therapy as prescribed 

## 2018-11-27 NOTE — Telephone Encounter (Signed)
Surgical clearance form received from [name], RN from [facility] on pt. Pt had clearance visit today, 11/27/2018 with [name]. Per [name], pt was currently having a COPD exacerbation and is going to be scheduled to come back to office in two weeks to how improvement from exacerbation is going.  [name] stated he is fine with pt having the surgery but wanted to make [facility] aware that pt is having a two-week follow up visit to check on improvement.  Attempted to call [name] but unable to reach her. Left message for [name] to return call. I am going to go ahead and fax the surgical clearance form as well as today's OV that pt had with [name] to the provided fax number from [name] but will also await a return call to go over this info with her. Per [name], pt needs to have guidance on when to hold the xarelto and this has been written on the clearance form by [name].

## 2018-12-03 ENCOUNTER — Telehealth: Payer: Self-pay

## 2018-12-03 NOTE — Telephone Encounter (Signed)
Pt called because he said that once he got home he started thinking about a question that Dr Oneida Alar had asked about him laying on his back. He said that he does have 6 compressed vertebrae.   Spoke with Zigmund Daniel and advised pt that it will be about 4 hours that he is on his back but they will help to manipulate him a little as possible to get him comfortable. Also advised him for comfort that this is not uncommon and they will have ways to keep him relaxed through the procedure.   York Cerise, CMA

## 2018-12-05 ENCOUNTER — Telehealth: Payer: Self-pay | Admitting: *Deleted

## 2018-12-05 NOTE — Telephone Encounter (Signed)
I called pt and let him know he may have been potentially exposed to an employee who later tested positive for COVID-19.  We are offering free testing for the COVID-19 if you are interested.  He has declined testing because he is having a procedure on June 5 in the hospital and they are going to test him for the virus there.

## 2018-12-08 ENCOUNTER — Other Ambulatory Visit (HOSPITAL_COMMUNITY)
Admission: RE | Admit: 2018-12-08 | Discharge: 2018-12-08 | Disposition: A | Discharge status: Home / Self Care | Payer: Medicare Other | Source: Ambulatory Visit | Attending: Vascular Surgery | Admitting: Vascular Surgery

## 2018-12-08 DIAGNOSIS — Z1159 Encounter for screening for other viral diseases: Secondary | ICD-10-CM | POA: Insufficient documentation

## 2018-12-09 ENCOUNTER — Telehealth: Payer: Medicare Other | Admitting: Pulmonary Disease

## 2018-12-09 LAB — NOVEL CORONAVIRUS, NAA (HOSP ORDER, SEND-OUT TO REF LAB; TAT 18-24 HRS): SARS-CoV-2, NAA: NOT DETECTED

## 2018-12-09 NOTE — Progress Notes (Addendum)
Virtual Visit via Telephone Note  I connected with Francisco [name] on 12/10/18 at  9:30 AM EDT by telephone and verified that I am speaking with the correct person using two identifiers.  Location: Patient: Home Provider: Office - [facility] - [address], [address], [place], [place] [address]  I discussed the limitations of evaluation and management by telemedicine and the availability of in person appointments. The patient expressed understanding and agreed to proceed. I also discussed with the patient that there may be a patient responsible charge related to this service. The patient expressed understanding and agreed to proceed.  Patient consented to consult via telephone: Yes People present and their role in pt care: Pt   History of Present Illness: 73 year old male former smoker followed in our office for COPD  PMH: Hypertension, osteoporosis, chronic PE, Smoker/ Smoking History: Former smoker.  Quit smoking in 2019.  104-pack-year smoking history. Maintenance: Symbicort 160, Spiriva Respimat 2.5, 10 mg prednisone daily, Monday Wednesday Friday azithromycin Pt of: [name]   Chief complaint: COPD exac   73 year old male former smoker followed in our office for COPD.  Patient was last seen 2 weeks ago in office for COPD exacerbation.  Patient reports that breathing and wheezing both improved significantly since that office visit.  Patient reports he still feels like he is recovering from it but he admits that his breathing has improved.  mMRC 3 today.  Patient proceeding forward with vascular surgery tomorrow.  He will only be receiving a localized anesthetic.  Patient reports that lower extremity swelling still persist.  He has been taking his Lasix daily instead of as needed.  He needs to notify primary care that he is needing his Lasix daily instead of as needed.  He reports that he well.  MMRC - Breathlessness Score 3 - I stop for breath after walking about 100  yards or after a few minutes on level ground (isle at grocery store is 179ft)  Observations/Objective:  04/07/2018-CTA- nonocclusive PE centrally involving branches of the right middle lobe and right lower lobe, no evidence of right heart strain  11/04/2017-echocardiogram-LV ejection fraction of 65 to 19%, grade 1 diastolic dysfunction  1/47/8295-[id number] function test- FVC 4.59 (92% predicted), postbronchodilator ratio 29, postbronchodilator FEV1 1.41 (40% predicted), DLCO 72  11/27/2018-chest x-ray- no active cardiopulmonary disease, COPD  Assessment and Plan:  COPD (chronic obstructive pulmonary disease) with emphysema (HCC) Assessment: 104-pack-year smoking history 2011 PFT showed an FEV1 of 1.41 (40% predicted) Maintained on Monday Wednesday Friday azithromycin, Symbicort 160, Spiriva Respimat 2.5, 10 mg of prednisone daily, Daliresp mMRC 3 today Patient reporting via telephone visit that symptoms have improved over the last 2 weeks with his breathing  Plan: Continue Symbicort 160, Spiriva Respimat 2.5, prednisone 10 mg daily, Monday Wednesday Friday azithromycin, Daliresp Follow-up in 8 weeks  Chronic respiratory failure with hypoxia ([place]) Plan: Continue oxygen therapy as prescribed  Chronic pulmonary embolism ([place]) Plan: Holding Xarelto right now to prepare for vascular procedure on 12/11/2018, patient to follow vascular instructions on resuming Xarelto, I assume this would be 24 hours after the procedure  Surgical counseling visit Peri-operative Assessment of Pulmonary Risk for Non-Thoracic Surgery:  For Mr. Becvar, risk of perioperative pulmonary complications is increased by:  Age greater than 84 years  COPD  Chronic PE   Respiratory complications generally occur in 1% of ASA Class I patients, 5% of ASA Class [name] and 10% of ASA Class III-IV patients These complications rarely result in mortality and  iclude postoperative pneumonia, atelectasis, pulmonary embolism, ARDS  and increased time requiring postoperative mechanical ventilation.  Overall, I recommend proceeding with the surgery if the risk for respiratory complications are outweighed by the potential benefits. This will need to be discussed between the patient and surgeon.  To reduce risks of respiratory complications, I recommend: --Pre- and post-operative incentive spirometry performed frequently while awake   I have discussed the risk factors and recommendations above with the patient.     Follow Up Instructions:  Return in about 2 months (around 02/09/2019), or if symptoms worsen or fail to improve, for Follow up with Dr. Purnell Shoemaker.    I discussed the assessment and treatment plan with the patient. The patient was provided an opportunity to ask questions and all were answered. The patient agreed with the plan and demonstrated an understanding of the instructions.   The patient was advised to call back or seek an in-person evaluation if the symptoms worsen or if the condition fails to improve as anticipated.  I provided 24 minutes of non-face-to-face time during this encounter.   Lauraine Rinne, NP

## 2018-12-10 ENCOUNTER — Ambulatory Visit (INDEPENDENT_AMBULATORY_CARE_PROVIDER_SITE_OTHER): Payer: Medicare Other | Admitting: Pulmonary Disease

## 2018-12-10 ENCOUNTER — Encounter: Payer: Self-pay | Admitting: Pulmonary Disease

## 2018-12-10 ENCOUNTER — Other Ambulatory Visit: Payer: Self-pay

## 2018-12-10 DIAGNOSIS — J438 Other emphysema: Secondary | ICD-10-CM

## 2018-12-10 DIAGNOSIS — Z01811 Encounter for preprocedural respiratory examination: Secondary | ICD-10-CM | POA: Insufficient documentation

## 2018-12-10 DIAGNOSIS — J9611 Chronic respiratory failure with hypoxia: Secondary | ICD-10-CM

## 2018-12-10 DIAGNOSIS — Z7189 Other specified counseling: Secondary | ICD-10-CM

## 2018-12-10 DIAGNOSIS — I2782 Chronic pulmonary embolism: Secondary | ICD-10-CM | POA: Diagnosis not present

## 2018-12-10 NOTE — Assessment & Plan Note (Signed)
Assessment: 104-pack-year smoking history 2011 PFT showed an FEV1 of 1.41 (40% predicted) Maintained on Monday Wednesday Friday azithromycin, Symbicort 160, Spiriva Respimat 2.5, 10 mg of prednisone daily, Daliresp mMRC 3 today Patient reporting via telephone visit that symptoms have improved over the last 2 weeks with his breathing  Plan: Continue Symbicort 160, Spiriva Respimat 2.5, prednisone 10 mg daily, Monday Wednesday Friday azithromycin, Daliresp Follow-up in 8 weeks

## 2018-12-10 NOTE — Assessment & Plan Note (Signed)
Plan: Holding Xarelto right now to prepare for vascular procedure on 12/11/2018, patient to follow vascular instructions on resuming Xarelto, I assume this would be 24 hours after the procedure

## 2018-12-10 NOTE — Assessment & Plan Note (Signed)
Peri-operative Assessment of Pulmonary Risk for Non-Thoracic Surgery:  For Francisco [name], risk of perioperative pulmonary complications is increased by:  Age greater than 54 years  COPD  Chronic PE   Respiratory complications generally occur in 1% of ASA Class I patients, 5% of ASA Class II and 10% of ASA Class III-IV patients These complications rarely result in mortality and iclude postoperative pneumonia, atelectasis, pulmonary embolism, ARDS and increased time requiring postoperative mechanical ventilation.  Overall, I recommend proceeding with the surgery if the risk for respiratory complications are outweighed by the potential benefits. This will need to be discussed between the patient and surgeon.  To reduce risks of respiratory complications, I recommend: --Pre- and post-operative incentive spirometry performed frequently while awake   I have discussed the risk factors and recommendations above with the patient.

## 2018-12-10 NOTE — Assessment & Plan Note (Signed)
Plan: Continue oxygen therapy as prescribed 

## 2018-12-10 NOTE — Patient Instructions (Addendum)
Peri-operative Assessment of Pulmonary Risk for Non-Thoracic Surgery:  ForMr. Longwell, risk of perioperative pulmonary complications is increased by:  Age greater than 39 years  COPD  Chronic PE   Respiratory complications generally occur in 1% of ASA Class I patients, 5% of ASA Class II and 10% of ASA Class III-IV patients These complications rarely result in mortality and iclude postoperative pneumonia, atelectasis, pulmonary embolism, ARDS and increased time requiring postoperative mechanical ventilation.  Overall, I recommend proceeding with the surgery if the risk for respiratory complications are outweighed by the potential benefits. This will need to be discussed between the patient and surgeon.  To reduce risks of respiratory complications, I recommend: --Pre- and post-operative incentive spirometry performed frequently while awake   I have discussed the risk factors and recommendations above with the patient.       Continue Symbicort 160 >>> 2 puffs in the morning right when you wake up, rinse out your mouth after use, 12 hours later 2 puffs, rinse after use >>> Take this daily, no matter what >>> This is not a rescue inhaler   Continue Spiriva Respimat 2.5 >>> 2 puffs daily >>> Do this every day >>>This is not a rescue inhaler  Continue daily prednisone 10mg    Continue daliresp daily as prescribed   Continue Monday Wednesday Friday azithromycin  Continue Keflex as prescribed  Continue Xarelto as prescribed     Note your daily symptoms >remember "red flags" for COPD:  >>>Increase in cough >>>increase in sputum production >>>increase in shortness of breath or activity  intolerance.   If you notice these symptoms, please call the office to be seen.    Return in about 2 months (around 02/09/2019), or if symptoms worsen or fail to improve, for Follow up with Dr. Purnell Shoemaker.   Coronavirus (COVID-19) Are you at risk?  Are you at risk for the  Coronavirus (COVID-19)?  To be considered HIGH RISK for Coronavirus (COVID-19), you have to meet the following criteria:  . Traveled to [place], [place], [place], [place] or [place]; or in the [place] to [place], [place], [place], or [place]; and have fever, cough, and shortness of breath within the last 2 weeks of travel OR . Been in close contact with a person diagnosed with COVID-19 within the last 2 weeks and have fever, cough, and shortness of breath . IF YOU DO NOT MEET THESE CRITERIA, YOU ARE CONSIDERED LOW RISK FOR COVID-19.  What to do if you are HIGH RISK for COVID-19?  [name] Kitchen If you are having a medical emergency, call 911. . Seek medical care right away. Before you go to a doctor's office, urgent care or emergency department, call ahead and tell them about your recent travel, contact with someone diagnosed with COVID-19, and your symptoms. You should receive instructions from your physician's office regarding next steps of care.  . When you arrive at healthcare provider, tell the healthcare staff immediately you have returned from visiting [place], [place], [place], [place] or [place]; or traveled in the [place] to [place], [place], [place], or [place]; in the last two weeks or you have been in close contact with a person diagnosed with COVID-19 in the last 2 weeks.   . Tell the health care staff about your symptoms: fever, cough and shortness of breath. . After you have been seen by a medical provider, you will be either: o Tested for (COVID-19) and discharged home on quarantine except to seek medical care if symptoms  worsen, and asked to  - Stay home and avoid contact with others until you get your results (4-5 days)  - Avoid travel on public transportation if possible (such as bus, train, or airplane) or o Sent to the Emergency Department by EMS for evaluation, COVID-19 testing, and possible admission depending on your condition and test results.  What to do if  you are LOW RISK for COVID-19?  Reduce your risk of any infection by using the same precautions used for avoiding the common cold or flu:  [name] Kitchen Wash your hands often with soap and warm water for at least 20 seconds.  If soap and water are not readily available, use an alcohol-based hand sanitizer with at least 60% alcohol.  . If coughing or sneezing, cover your mouth and nose by coughing or sneezing into the elbow areas of your shirt or coat, into a tissue or into your sleeve (not your hands). . Avoid shaking hands with others and consider head nods or verbal greetings only. . Avoid touching your eyes, nose, or mouth with unwashed hands.  . Avoid close contact with people who are sick. . Avoid places or events with large numbers of people in one location, like concerts or sporting events. . Carefully consider travel plans you have or are making. . If you are planning any travel outside or inside the [place], visit the CDC's Travelers' Health webpage for the latest health notices. . If you have some symptoms but not all symptoms, continue to monitor at home and seek medical attention if your symptoms worsen. . If you are having a medical emergency, call 911.   [place] / e-Visit: [email or web address]         [facility]: [facility]: [phone]                   [facility]: [phone]           It is flu season:   >>> Best ways to protect herself from the flu: Receive the yearly flu vaccine, practice good hand hygiene washing with soap and also using hand sanitizer when available, eat a nutritious meals, get adequate rest, hydrate appropriately   Please contact the office if your symptoms worsen or you have concerns that you are not improving.   Thank you for choosing [facility] for your healthcare, and for allowing [place]  to partner with you on your healthcare journey. I am thankful to be able to provide care to you today.   [name] FNP-C

## 2018-12-11 ENCOUNTER — Encounter (HOSPITAL_COMMUNITY)
Admission: RE | Disposition: A | Discharge status: Home / Self Care | Payer: Self-pay | Source: Home / Self Care | Attending: Vascular Surgery

## 2018-12-11 ENCOUNTER — Inpatient Hospital Stay (HOSPITAL_COMMUNITY): Payer: Medicare Other

## 2018-12-11 ENCOUNTER — Ambulatory Visit: Payer: Medicare Other | Admitting: Family Medicine

## 2018-12-11 ENCOUNTER — Encounter (HOSPITAL_COMMUNITY): Payer: Self-pay | Admitting: General Practice

## 2018-12-11 ENCOUNTER — Observation Stay (HOSPITAL_COMMUNITY)
Admission: RE | Admit: 2018-12-11 | Discharge: 2018-12-12 | Disposition: A | Discharge status: Home / Self Care | Payer: Medicare Other | Attending: Vascular Surgery | Admitting: Vascular Surgery

## 2018-12-11 ENCOUNTER — Other Ambulatory Visit: Payer: Self-pay

## 2018-12-11 DIAGNOSIS — Z86711 Personal history of pulmonary embolism: Secondary | ICD-10-CM | POA: Diagnosis not present

## 2018-12-11 DIAGNOSIS — M549 Dorsalgia, unspecified: Secondary | ICD-10-CM | POA: Insufficient documentation

## 2018-12-11 DIAGNOSIS — Z87891 Personal history of nicotine dependence: Secondary | ICD-10-CM | POA: Insufficient documentation

## 2018-12-11 DIAGNOSIS — J439 Emphysema, unspecified: Secondary | ICD-10-CM | POA: Insufficient documentation

## 2018-12-11 DIAGNOSIS — Z9981 Dependence on supplemental oxygen: Secondary | ICD-10-CM | POA: Insufficient documentation

## 2018-12-11 DIAGNOSIS — S81801A Unspecified open wound, right lower leg, initial encounter: Secondary | ICD-10-CM | POA: Insufficient documentation

## 2018-12-11 DIAGNOSIS — I700 Atherosclerosis of aorta: Secondary | ICD-10-CM | POA: Insufficient documentation

## 2018-12-11 DIAGNOSIS — Z85828 Personal history of other malignant neoplasm of skin: Secondary | ICD-10-CM | POA: Insufficient documentation

## 2018-12-11 DIAGNOSIS — Z79899 Other long term (current) drug therapy: Secondary | ICD-10-CM | POA: Insufficient documentation

## 2018-12-11 DIAGNOSIS — Z7901 Long term (current) use of anticoagulants: Secondary | ICD-10-CM | POA: Diagnosis not present

## 2018-12-11 DIAGNOSIS — S301XXA Contusion of abdominal wall, initial encounter: Secondary | ICD-10-CM | POA: Insufficient documentation

## 2018-12-11 DIAGNOSIS — G8929 Other chronic pain: Secondary | ICD-10-CM | POA: Diagnosis not present

## 2018-12-11 DIAGNOSIS — Z79890 Hormone replacement therapy (postmenopausal): Secondary | ICD-10-CM | POA: Diagnosis not present

## 2018-12-11 DIAGNOSIS — Z7951 Long term (current) use of inhaled steroids: Secondary | ICD-10-CM | POA: Diagnosis not present

## 2018-12-11 DIAGNOSIS — W228XXA Striking against or struck by other objects, initial encounter: Secondary | ICD-10-CM | POA: Insufficient documentation

## 2018-12-11 DIAGNOSIS — I739 Peripheral vascular disease, unspecified: Principal | ICD-10-CM | POA: Diagnosis present

## 2018-12-11 HISTORY — PX: PERIPHERAL VASCULAR INTERVENTION: CATH118257

## 2018-12-11 HISTORY — PX: LOWER EXTREMITY ANGIOGRAPHY: CATH118251

## 2018-12-11 HISTORY — DX: Peripheral vascular disease, unspecified: I73.9

## 2018-12-11 LAB — POCT ACTIVATED CLOTTING TIME
Activated Clotting Time: 175 seconds
Activated Clotting Time: 191 seconds

## 2018-12-11 LAB — POCT I-STAT, CHEM 8
BUN: 9 mg/dL (ref 8–23)
Calcium, Ion: 1.2 mmol/L (ref 1.15–1.40)
Chloride: 96 mmol/L — ABNORMAL LOW (ref 98–111)
Creatinine, Ser: 1 mg/dL (ref 0.61–1.24)
Glucose, Bld: 87 mg/dL (ref 70–99)
HCT: 38 % — ABNORMAL LOW (ref 39.0–52.0)
Hemoglobin: 12.9 g/dL — ABNORMAL LOW (ref 13.0–17.0)
Potassium: 4.2 mmol/L (ref 3.5–5.1)
Sodium: 133 mmol/L — ABNORMAL LOW (ref 135–145)
TCO2: 32 mmol/L (ref 22–32)

## 2018-12-11 LAB — CBC
HCT: 36.9 % — ABNORMAL LOW (ref 39.0–52.0)
Hemoglobin: 12.3 g/dL — ABNORMAL LOW (ref 13.0–17.0)
MCH: 33.5 pg (ref 26.0–34.0)
MCHC: 33.3 g/dL (ref 30.0–36.0)
MCV: 100.5 fL — ABNORMAL HIGH (ref 80.0–100.0)
Platelets: 181 10*3/uL (ref 150–400)
RBC: 3.67 MIL/uL — ABNORMAL LOW (ref 4.22–5.81)
RDW: 12.2 % (ref 11.5–15.5)
WBC: 10.1 10*3/uL (ref 4.0–10.5)
nRBC: 0 % (ref 0.0–0.2)

## 2018-12-11 IMAGING — CT CT ABDOMEN AND PELVIS WITHOUT CONTRAST
2 of 4 series · 17 of 46 positions shown, 19 images · non-contrast
Comparison: None.

CLINICAL DATA: Right groin hematoma, s/p aortogram, right common
iliac stent

EXAM:
CT ABDOMEN AND PELVIS WITHOUT CONTRAST
TECHNIQUE: Multidetector CT imaging of the abdomen and pelvis was performed
following the standard protocol without IV contrast.

[Series 3: a/p w/o 5mm · axial · non-contrast · 0.78mm/px · z∈[+662,+1068]mm · 14 of 89 slices shown, 16 images]
[im 4/89  soft-tissue]
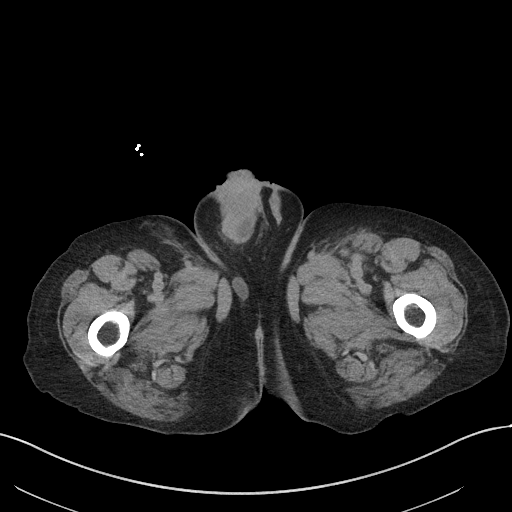
[im 4/89  bone]
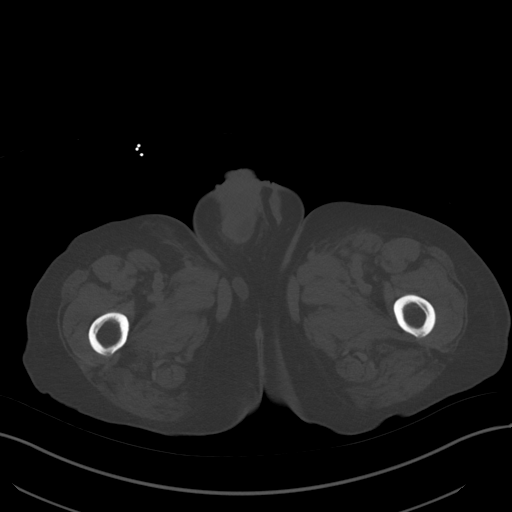
[im 11/89  soft-tissue]
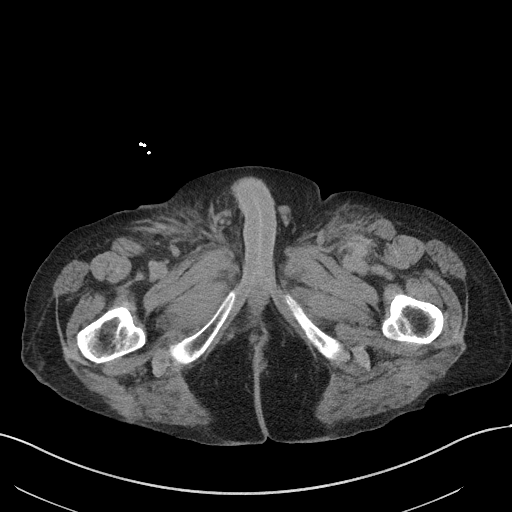
[im 17/89  soft-tissue]
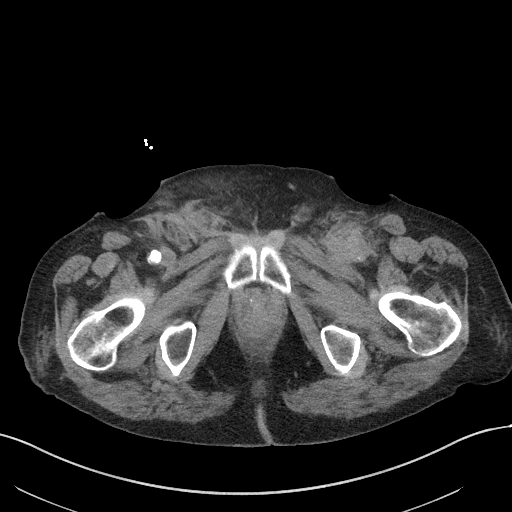
[im 24/89  soft-tissue]
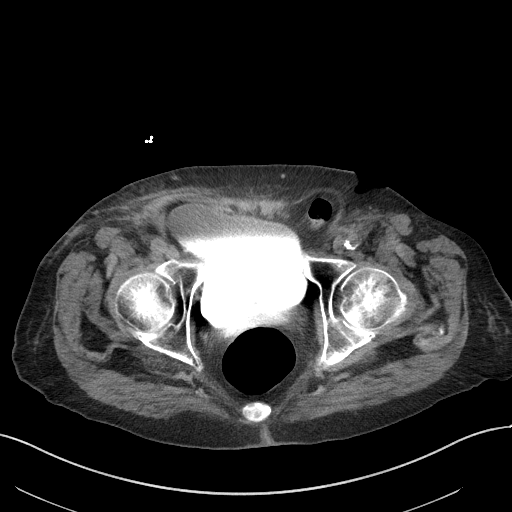
[im 31/89  soft-tissue]
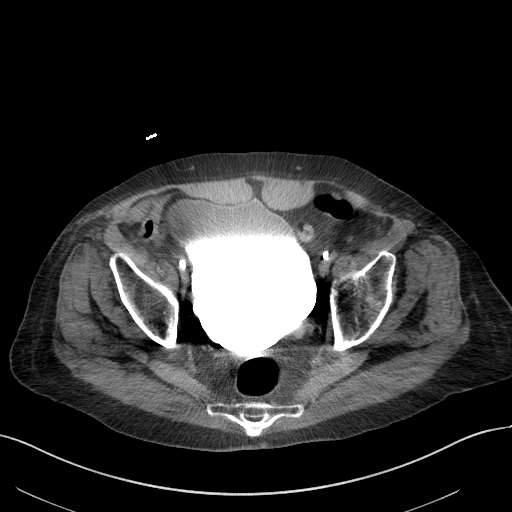
[im 34/89  soft-tissue]
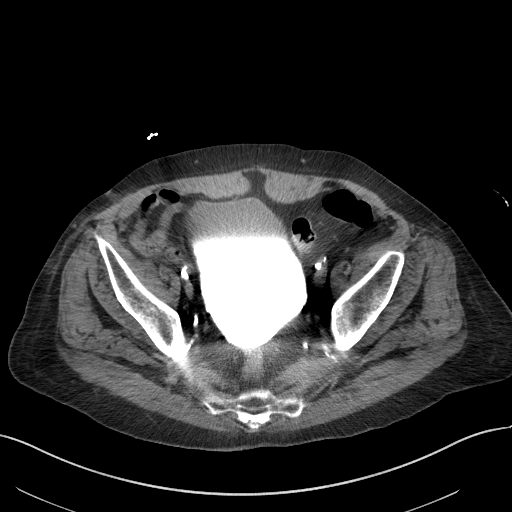
[im 41/89  soft-tissue]
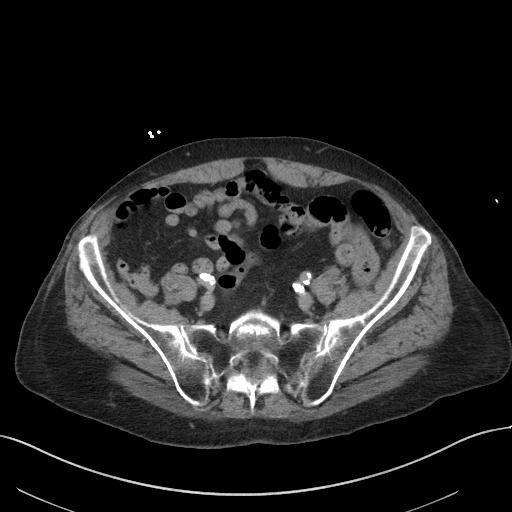
[im 48/89  soft-tissue]
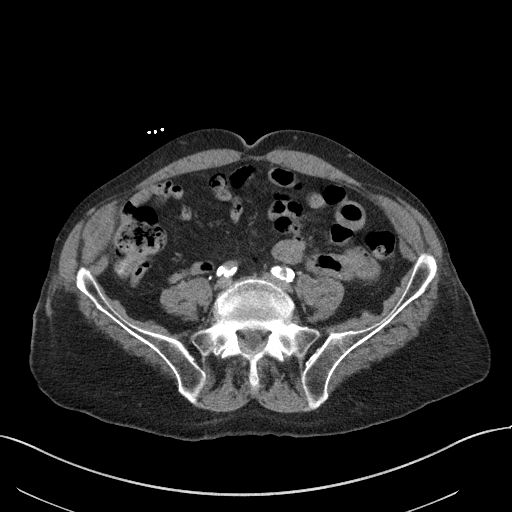
[im 55/89  soft-tissue]
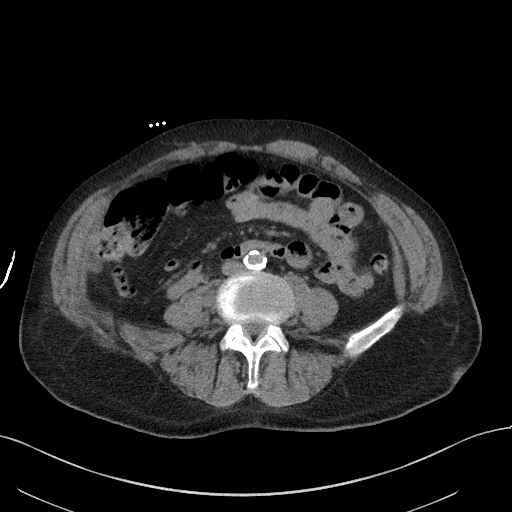
[im 55/89  bone]
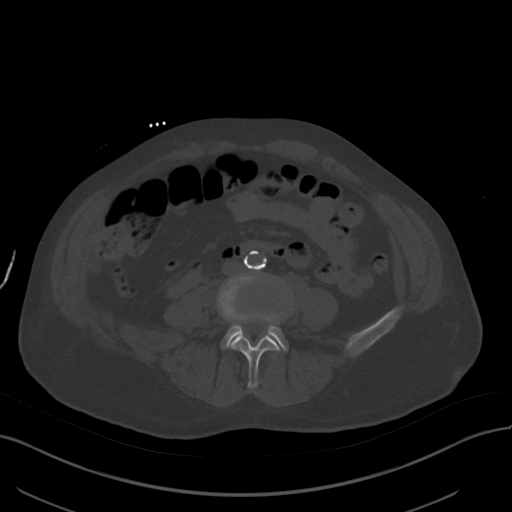
[im 58/89  soft-tissue]
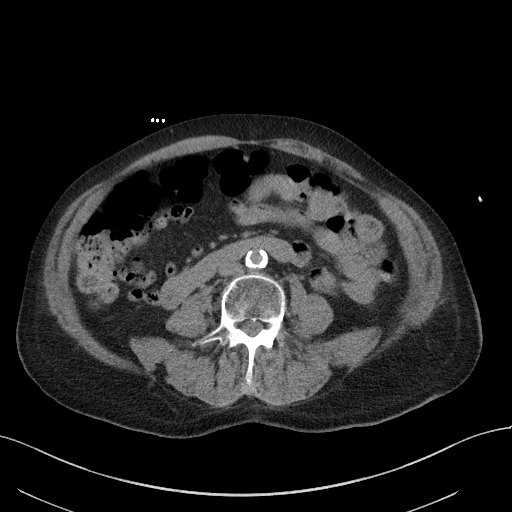
[im 65/89  soft-tissue]
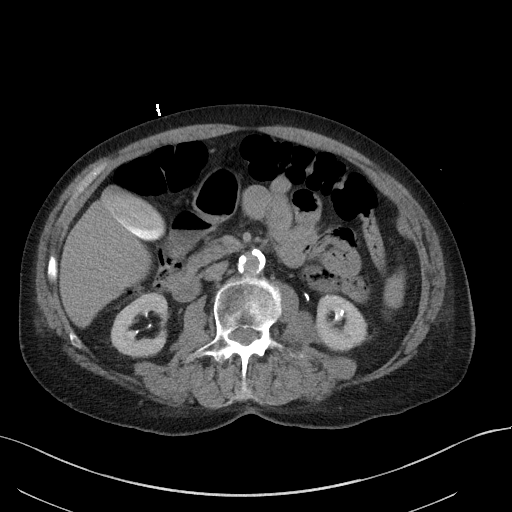
[im 72/89  soft-tissue]
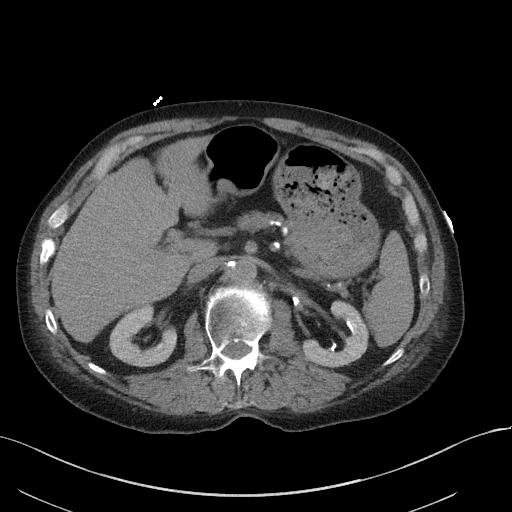
[im 78/89  soft-tissue]
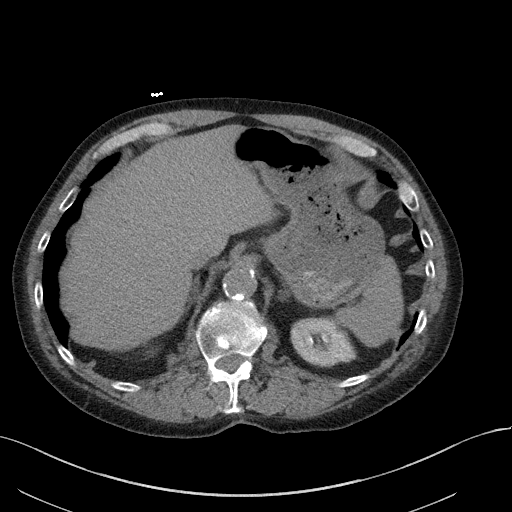
[im 85/89  soft-tissue]
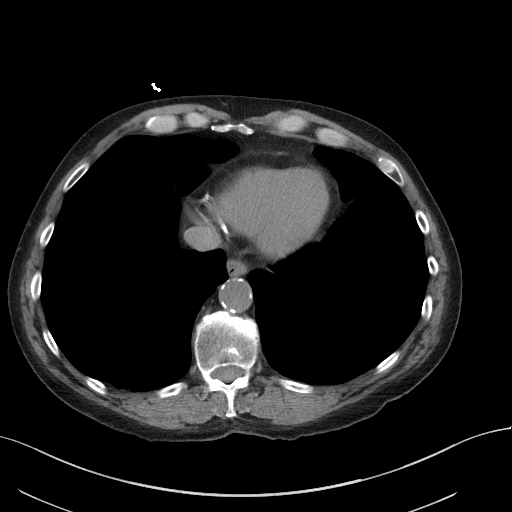

[Series 6: a/p w/o cor · coronal · non-contrast · 0.87mm/px · 3 of 154 slices shown]
[im 52/154  soft-tissue]
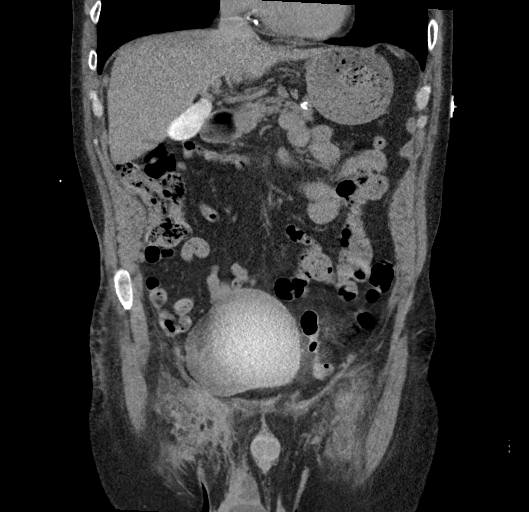
[im 69/154  soft-tissue]
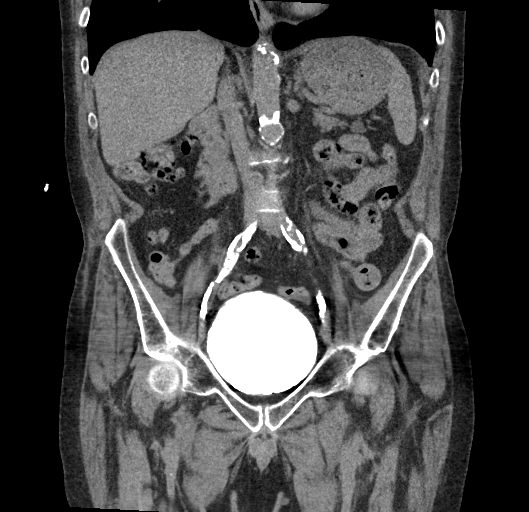
[im 86/154  soft-tissue]
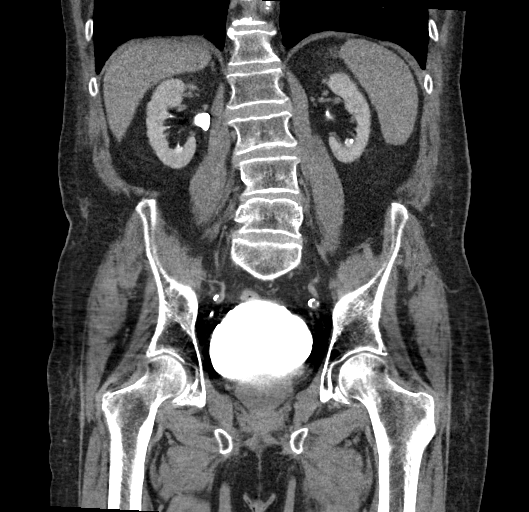

[17 of 46 positions shown; findings below may reference images not displayed]

FINDINGS: Lower chest: Severe emphysema.  Coronary artery calcifications.

Hepatobiliary: No solid liver abnormality is seen. No gallstones,
gallbladder wall thickening, or biliary dilatation. Excreted
contrast in the gallbladder.

Pancreas: Unremarkable. No pancreatic ductal dilatation or
surrounding inflammatory changes.

Spleen: Normal in size without significant abnormality.

Adrenals/Urinary Tract: Adrenal glands are unremarkable. Excreted
contrast in the kidneys and urinary bladder. Kidneys are normal,
without renal calculi, solid lesion, or hydronephrosis. Bladder is
unremarkable.

Stomach/Bowel: Stomach is within normal limits. Appendix appears
normal. No evidence of bowel wall thickening, distention, or
inflammatory changes.

Vascular/Lymphatic: Severe calcific atherosclerosis. Right common
iliac artery stent. No enlarged abdominal or pelvic lymph nodes.

Reproductive: No mass or other significant abnormality.

Other: Small bilateral inguinal hernias. No abdominopelvic ascites.
Soft tissue stranding and pressure dressings over the bilateral
groins. No discrete fluid collection noted.

Musculoskeletal: No acute or significant osseous findings.
IMPRESSION: 1. Soft tissue stranding and pressure dressings over the bilateral
groins. No discrete fluid collection appreciated on noncontrast CT.
Consider targeted ultrasound to further evaluate for pseudoaneurysm
if suspected.

2. Severe calcific atherosclerosis of the abdominal aorta and branch
vessels. Right common iliac artery stent.

3.  Emphysema.

## 2018-12-11 SURGERY — LOWER EXTREMITY ANGIOGRAPHY
Anesthesia: LOCAL | Laterality: Right

## 2018-12-11 MED ORDER — SODIUM CHLORIDE 0.9 % IV SOLN: 250.0000 mL | INTRAVENOUS | Status: DC | PRN

## 2018-12-11 MED ORDER — OXYCODONE HCL 5 MG PO TABS
ORAL_TABLET | ORAL | Status: AC
  Filled 2018-12-11: qty 2

## 2018-12-11 MED ORDER — UMECLIDINIUM BROMIDE 62.5 MCG/INH IN AEPB
2.0000 | INHALATION_SPRAY | Freq: Every day | RESPIRATORY_TRACT | Status: DC
  Filled 2018-12-11 (×2): qty 7

## 2018-12-11 MED ORDER — ROFLUMILAST 500 MCG PO TABS
500.0000 ug | ORAL_TABLET | Freq: Every day | ORAL | Status: DC
  Administered 2018-12-12: 500 ug via ORAL
  Filled 2018-12-11 (×2): qty 1

## 2018-12-11 MED ORDER — PANTOPRAZOLE SODIUM 40 MG PO TBEC
40.0000 mg | DELAYED_RELEASE_TABLET | Freq: Every day | ORAL | Status: DC
  Administered 2018-12-12: 40 mg via ORAL
  Filled 2018-12-11: qty 1

## 2018-12-11 MED ORDER — POTASSIUM CHLORIDE CRYS ER 20 MEQ PO TBCR: 20.0000 meq | EXTENDED_RELEASE_TABLET | Freq: Once | ORAL | Status: DC

## 2018-12-11 MED ORDER — LIDOCAINE HCL (PF) 1 % IJ SOLN
INTRAMUSCULAR | Status: AC
  Filled 2018-12-11: qty 30

## 2018-12-11 MED ORDER — MORPHINE SULFATE 2 MG/ML IJ SOLN: 2.0000 mg | INTRAMUSCULAR | Status: DC | PRN

## 2018-12-11 MED ORDER — HEPARIN SODIUM (PORCINE) 1000 UNIT/ML IJ SOLN
INTRAMUSCULAR | Status: AC
  Filled 2018-12-11: qty 1

## 2018-12-11 MED ORDER — SODIUM CHLORIDE 0.9 % IV SOLN
INTRAVENOUS | Status: DC
  Administered 2018-12-11: 09:00:00 via INTRAVENOUS

## 2018-12-11 MED ORDER — ALBUTEROL SULFATE (2.5 MG/3ML) 0.083% IN NEBU: 3.0000 mL | INHALATION_SOLUTION | Freq: Four times a day (QID) | RESPIRATORY_TRACT | Status: DC | PRN

## 2018-12-11 MED ORDER — SODIUM CHLORIDE 0.9 % IV SOLN: INTRAVENOUS | Status: AC

## 2018-12-11 MED ORDER — ACETAMINOPHEN 325 MG PO TABS
650.0000 mg | ORAL_TABLET | ORAL | Status: DC | PRN
  Administered 2018-12-11: 650 mg via ORAL
  Filled 2018-12-11: qty 2

## 2018-12-11 MED ORDER — SODIUM CHLORIDE 0.9 % IV BOLUS: 500.0000 mL | Freq: Once | INTRAVENOUS | Status: DC

## 2018-12-11 MED ORDER — GUAIFENESIN-DM 100-10 MG/5ML PO SYRP: 15.0000 mL | ORAL_SOLUTION | ORAL | Status: DC | PRN

## 2018-12-11 MED ORDER — SODIUM CHLORIDE 0.9 % IV SOLN
INTRAVENOUS | Status: DC
  Administered 2018-12-11: 18:00:00 via INTRAVENOUS

## 2018-12-11 MED ORDER — SODIUM CHLORIDE 0.9% FLUSH: 3.0000 mL | INTRAVENOUS | Status: DC | PRN

## 2018-12-11 MED ORDER — OXYCODONE HCL 5 MG PO TABS
5.0000 mg | ORAL_TABLET | ORAL | Status: DC | PRN
  Administered 2018-12-11: 5 mg via ORAL
  Administered 2018-12-11: 10 mg via ORAL
  Administered 2018-12-11 (×2): 5 mg via ORAL
  Administered 2018-12-12: 10 mg via ORAL
  Filled 2018-12-11: qty 1
  Filled 2018-12-11 (×2): qty 2

## 2018-12-11 MED ORDER — LIDOCAINE HCL (PF) 1 % IJ SOLN
INTRAMUSCULAR | Status: DC | PRN
  Administered 2018-12-11 (×2): 15 mL via INTRADERMAL

## 2018-12-11 MED ORDER — PHENOL 1.4 % MT LIQD: 1.0000 | OROMUCOSAL | Status: DC | PRN

## 2018-12-11 MED ORDER — METOPROLOL TARTRATE 5 MG/5ML IV SOLN: 2.0000 mg | INTRAVENOUS | Status: DC | PRN

## 2018-12-11 MED ORDER — HEPARIN (PORCINE) IN NACL 1000-0.9 UT/500ML-% IV SOLN
INTRAVENOUS | Status: AC
  Filled 2018-12-11: qty 1000

## 2018-12-11 MED ORDER — HYDRALAZINE HCL 20 MG/ML IJ SOLN: 5.0000 mg | INTRAMUSCULAR | Status: DC | PRN

## 2018-12-11 MED ORDER — ONDANSETRON HCL 4 MG/2ML IJ SOLN: 4.0000 mg | Freq: Four times a day (QID) | INTRAMUSCULAR | Status: DC | PRN

## 2018-12-11 MED ORDER — SODIUM CHLORIDE 0.9% FLUSH
3.0000 mL | Freq: Two times a day (BID) | INTRAVENOUS | Status: DC
  Administered 2018-12-11: 3 mL via INTRAVENOUS

## 2018-12-11 MED ORDER — MORPHINE SULFATE (PF) 2 MG/ML IV SOLN
INTRAVENOUS | Status: AC
  Administered 2018-12-11: 2 mg via INTRAVENOUS
  Filled 2018-12-11: qty 1

## 2018-12-11 MED ORDER — ALUM & MAG HYDROXIDE-SIMETH 200-200-20 MG/5ML PO SUSP: 15.0000 mL | ORAL | Status: DC | PRN

## 2018-12-11 MED ORDER — FUROSEMIDE 20 MG PO TABS: 20.0000 mg | ORAL_TABLET | Freq: Every day | ORAL | Status: DC | PRN

## 2018-12-11 MED ORDER — LABETALOL HCL 5 MG/ML IV SOLN
INTRAVENOUS | Status: AC
  Filled 2018-12-11: qty 4

## 2018-12-11 MED ORDER — HEPARIN SODIUM (PORCINE) 1000 UNIT/ML IJ SOLN
INTRAMUSCULAR | Status: DC | PRN
  Administered 2018-12-11: 8000 [IU] via INTRAVENOUS

## 2018-12-11 MED ORDER — AZITHROMYCIN 250 MG PO TABS
250.0000 mg | ORAL_TABLET | ORAL | Status: DC
  Filled 2018-12-11: qty 1

## 2018-12-11 MED ORDER — IODIXANOL 320 MG/ML IV SOLN
INTRAVENOUS | Status: DC | PRN
  Administered 2018-12-11: 160 mL via INTRA_ARTERIAL

## 2018-12-11 MED ORDER — LABETALOL HCL 5 MG/ML IV SOLN
10.0000 mg | INTRAVENOUS | Status: DC | PRN
  Administered 2018-12-11: 10 mg via INTRAVENOUS

## 2018-12-11 MED ORDER — HEPARIN (PORCINE) IN NACL 1000-0.9 UT/500ML-% IV SOLN
INTRAVENOUS | Status: DC | PRN
  Administered 2018-12-11 (×2): 500 mL

## 2018-12-11 MED ORDER — MORPHINE SULFATE (PF) 10 MG/ML IV SOLN: 2.0000 mg | INTRAVENOUS | Status: DC | PRN

## 2018-12-11 SURGICAL SUPPLY — 12 items
CATH ANGIO 5F PIGTAIL 65CM (CATHETERS) ×1 IMPLANT
KIT ENCORE 26 ADVANTAGE (KITS) ×1 IMPLANT
KIT PV (KITS) ×3 IMPLANT
SHEATH BRITE TIP 7FR 35CM (SHEATH) ×1 IMPLANT
SHEATH PINNACLE 5F 10CM (SHEATH) ×1 IMPLANT
SHEATH PROBE COVER 6X72 (BAG) ×2 IMPLANT
STENT VIABAHN 7X29X80 VBX (Permanent Stent) ×1 IMPLANT
SYR MEDRAD MARK V 150ML (SYRINGE) ×1 IMPLANT
TRANSDUCER W/STOPCOCK (MISCELLANEOUS) ×3 IMPLANT
TRAY PV CATH (CUSTOM PROCEDURE TRAY) ×3 IMPLANT
WIRE HITORQ VERSACORE ST 145CM (WIRE) ×1 IMPLANT
WIRE J 3MM .035X145CM (WIRE) ×1 IMPLANT

## 2018-12-11 NOTE — Progress Notes (Signed)
Report called to Nicanor Bake, RN

## 2018-12-11 NOTE — Progress Notes (Signed)
Hematoma noted on right groin at q15 minute check.  Patients BP 80/60 and RN placed patient in trendelenburg and began holding pressure. [name] made aware.  Order for 500 ML bolus and morphine.

## 2018-12-11 NOTE — Op Note (Signed)
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  Patent outflow bilaterally with three-vessel runoff bilaterally  Operative details: After obtaining form consent, the patient was taken the [place] lab.  The patient placed supine position operating table.  Local anesthesia was infiltrated over the left common femoral artery using ultrasound guidance.  Ultrasound was then used to cannulate the left common femoral artery and an 035 J-wire threaded up the abdominal aorta under fluoroscopic guidance.  A 5 French sheath placed over the guidewire in the left common femoral artery.  This was thoroughly flushed heparinized saline.  5 French pigtail catheter was then advanced over the guidewire to the abdominal aorta and abdominal aortogram was obtained AP projection.  Left and right renal arteries are widely patent.  The aorta has moderate to severe calcification.  There is a high-grade 80% stenosis of the right common iliac near its proximal segment.  The left common iliac is patent.  Next the pigtail catheter was pulled on this above the aortic bifurcation and bilateral oblique views of the pelvis were obtained with magnification to further clarify this.  On the right side the right common iliac has a several centimeter 80% stenosis near its origin.  The right internal iliac artery is occluded.  The right external iliac artery is patent.  The left common external and internal iliac arteries are patent.  At this point bilateral lower extremity runoff views were obtained through the pigtail catheter.  In the left leg, the left common femoral profundofemoral superficial femoral popliteal and all 3 tibial vessels are all  patent.  In the right leg the right common femoral artery is patent proximally but has a subtotal occlusion just before the femoral bifurcation.  Otherwise the profunda and superficial femoral arteries are widely patent.  Popliteal artery is patent.  Three-vessel runoff to the right foot.  At this point is decided to intervene on the right common iliac stenosis.  Ultrasound was used to identify the right common femoral artery femoral bifurcation.  Images were obtained of the left and right common femoral arteries using ultrasound.  Introducer needle was used to cannulate the right common femoral artery under ultrasound guidance.  035 versa core wire was threaded up into the right iliac system and across the lesion very easily.  A 7 French bright tip sheath was then advanced up and across the lesion.  The patient was given 7000 units of intravenous heparin.  A 7 x 29 VBX stent was centered on the lesion and deployed to nominal pressure.  Completion angiogram showed good wall apposition of the stent no evidence of dissection widely patent right common iliac artery.  At this point all the guidewires were removed and the sheaths were left in place in both sides to be pulled in the holding area after the ACT is less than 175.  Patient tolerated procedure well and there were no complications.  Operative management: Patient had an 80% stenosis of his right common iliac artery today treated to 0% residual stenosis.  He still has subtotal occlusion of his distal right common femoral artery.  He is not a very good operative candidate due to his underlying pulmonary dysfunction.  We will see if his right leg symptoms improve any with  iliac stenting.  If not we would schedule him for a right common femoral endarterectomy under local anesthesia in the operating room in the next few weeks.  Ruta Hinds, MD [facility] Office: [phone] Pager: [phone]

## 2018-12-11 NOTE — Progress Notes (Signed)
Site area: rt groin fa sheath Site Prior to Removal:  Level 0 Pressure Applied For: 25 minutes Manual:   yes Patient Status During Pull:  stable Post Pull Site:  Level  0 Post Pull Instructions Given:  yes Post Pull Pulses Present: rt pt dopplered Dressing Applied:  Gauze and tegaderm Bedrest begins @ 1410 Comments:

## 2018-12-11 NOTE — Interval H&P Note (Signed)
History and Physical Interval Note:  12/11/2018 9:39 AM  Francisco [name]  has presented today for surgery, with the diagnosis of pad.  The various methods of treatment have been discussed with the patient and family. After consideration of risks, benefits and other options for treatment, the patient has consented to  Procedure(s): LOWER EXTREMITY ANGIOGRAPHY (N/A) as a surgical intervention.  The patient's history has been reviewed, patient examined, no change in status, stable for surgery.  I have reviewed the patient's chart and labs.  Questions were answered to the patient's satisfaction.     Ruta Hinds

## 2018-12-11 NOTE — Progress Notes (Signed)
Discharge instructions reviewed with wife via telephone due to [place] restrictions.  All questions answered and Francisco [name] verbalized understanding

## 2018-12-11 NOTE — Progress Notes (Signed)
Site area: left groin fa sheath Site Prior to Removal:  Level 0 Pressure Applied For: 20 minutes Manual:   yes Patient Status During Pull:  stable Post Pull Site:  Level  0 Post Pull Instructions Given:  yes Post Pull Pulses Present: left pt dopplered Dressing Applied:  Gauze and tegaderm Bedrest begins @  Comments:

## 2018-12-11 NOTE — Discharge Instructions (Signed)
Femoral Site Care This sheet gives you information about how to care for yourself after your procedure. Your health care provider may also give you more specific instructions. If you have problems or questions, contact your health care provider. What can I expect after the procedure? After the procedure, it is common to have:  Bruising that usually fades within 1-2 weeks.  Tenderness at the site. Follow these instructions at home: Wound care  Follow instructions from your health care provider about how to take care of your insertion site. Make sure you: ? Wash your hands with soap and water before you change your bandage (dressing). If soap and water are not available, use hand sanitizer. ? Change your dressing as told by your health care provider. ? Leave stitches (sutures), skin glue, or adhesive strips in place. These skin closures may need to stay in place for 2 weeks or longer. If adhesive strip edges start to loosen and curl up, you may trim the loose edges. Do not remove adhesive strips completely unless your health care provider tells you to do that.  Do not take baths, swim, or use a hot tub until your health care provider approves.  You may shower 24-48 hours after the procedure or as told by your health care provider. ? Gently wash the site with plain soap and water. ? Pat the area dry with a clean towel. ? Do not rub the site. This may cause bleeding.  Do not apply powder or lotion to the site. Keep the site clean and dry.  Check your femoral site every day for signs of infection. Check for: ? Redness, swelling, or pain. ? Fluid or blood. ? Warmth. ? Pus or a bad smell. Activity  For the first 2-3 days after your procedure, or as long as directed: ? Avoid climbing stairs as much as possible. ? Do not squat.  Do not lift anything that is heavier than 10 lb (4.5 kg), or the limit that you are told, until your health care provider says that it is safe.  Rest as  directed. ? Avoid sitting for a long time without moving. Get up to take short walks every 1-2 hours.  Do not drive for 24 hours if you were given a medicine to help you relax (sedative). General instructions  Take over-the-counter and prescription medicines only as told by your health care provider.  Keep all follow-up visits as told by your health care provider. This is important. Contact a health care provider if you have:  A fever or chills.  You have redness, swelling, or pain around your insertion site. Get help right away if:  The catheter insertion area swells very fast.  You pass out.  You suddenly start to sweat or your skin gets clammy.  The catheter insertion area is bleeding, and the bleeding does not stop when you hold steady pressure on the area.  The area near or just beyond the catheter insertion site becomes pale, cool, tingly, or numb. These symptoms may represent a serious problem that is an emergency. Do not wait to see if the symptoms will go away. Get medical help right away. Call your local emergency services (911 in the [place] Do not drive yourself to the hospital. Summary  After the procedure, it is common to have bruising that usually fades within 1-2 weeks.  Check your femoral site every day for signs of infection.  Do not lift anything that is heavier than 10 lb (4.5 kg), or the  limit that you are told, until your health care provider says that it is safe. This information is not intended to replace advice given to you by your health care provider. Make sure you discuss any questions you have with your health care provider. Document Released: 02/25/2014 Document Revised: 07/07/2017 Document Reviewed: 07/07/2017 Elsevier Interactive Patient Education  2019 [place].  Moderate Conscious Sedation, Adult, Care After These instructions provide you with information about caring for yourself after your procedure. Your health care provider may also give  you more specific instructions. Your treatment has been planned according to current medical practices, but problems sometimes occur. Call your health care provider if you have any problems or questions after your procedure. What can I expect after the procedure? After your procedure, it is common:  To feel sleepy for several hours.  To feel clumsy and have poor balance for several hours.  To have poor judgment for several hours.  To vomit if you eat too soon. Follow these instructions at home: For at least 24 hours after the procedure:   Do not: ? Participate in activities where you could fall or become injured. ? Drive. ? Use heavy machinery. ? Drink alcohol. ? Take sleeping pills or medicines that cause drowsiness. ? Make important decisions or sign legal documents. ? Take care of children on your own.  Rest. Eating and drinking  Follow the diet recommended by your health care provider.  If you vomit: ? Drink water, juice, or soup when you can drink without vomiting. ? Make sure you have little or no nausea before eating solid foods. General instructions  Have a responsible adult stay with you until you are awake and alert.  Take over-the-counter and prescription medicines only as told by your health care provider.  If you smoke, do not smoke without supervision.  Keep all follow-up visits as told by your health care provider. This is important. Contact a health care provider if:  You keep feeling nauseous or you keep vomiting.  You feel light-headed.  You develop a rash.  You have a fever. Get help right away if:  You have trouble breathing. This information is not intended to replace advice given to you by your health care provider. Make sure you discuss any questions you have with your health care provider. Document Released: 04/14/2013 Document Revised: 11/27/2015 Document Reviewed: 10/14/2015 Elsevier Interactive Patient Education  2019 [employer].

## 2018-12-12 DIAGNOSIS — I739 Peripheral vascular disease, unspecified: Secondary | ICD-10-CM | POA: Diagnosis not present

## 2018-12-12 LAB — CBC
HCT: 35.5 % — ABNORMAL LOW (ref 39.0–52.0)
Hemoglobin: 11.9 g/dL — ABNORMAL LOW (ref 13.0–17.0)
MCH: 33.2 pg (ref 26.0–34.0)
MCHC: 33.5 g/dL (ref 30.0–36.0)
MCV: 99.2 fL (ref 80.0–100.0)
Platelets: 171 10*3/uL (ref 150–400)
RBC: 3.58 MIL/uL — ABNORMAL LOW (ref 4.22–5.81)
RDW: 12 % (ref 11.5–15.5)
WBC: 8.1 10*3/uL (ref 4.0–10.5)
nRBC: 0 % (ref 0.0–0.2)

## 2018-12-12 LAB — COMPREHENSIVE METABOLIC PANEL
ALT: 28 U/L (ref 0–44)
AST: 22 U/L (ref 15–41)
Albumin: 3.3 g/dL — ABNORMAL LOW (ref 3.5–5.0)
Alkaline Phosphatase: 49 U/L (ref 38–126)
Anion gap: 7 (ref 5–15)
BUN: 10 mg/dL (ref 8–23)
CO2: 28 mmol/L (ref 22–32)
Calcium: 8.6 mg/dL — ABNORMAL LOW (ref 8.9–10.3)
Chloride: 103 mmol/L (ref 98–111)
Creatinine, Ser: 0.99 mg/dL (ref 0.61–1.24)
GFR calc Af Amer: >60 mL/min (ref >60)
GFR calc non Af Amer: >60 mL/min (ref >60)
Glucose, Bld: 104 mg/dL — ABNORMAL HIGH (ref 70–99)
Potassium: 4.1 mmol/L (ref 3.5–5.1)
Sodium: 138 mmol/L (ref 135–145)
Total Bilirubin: 0.7 mg/dL (ref 0.3–1.2)
Total Protein: 5.7 g/dL — ABNORMAL LOW (ref 6.5–8.1)

## 2018-12-12 NOTE — TOC Transition Note (Signed)
Transition of Care [facility]) - CM/SW Discharge Note   Patient Details  Name: Francisco [name] MRN: [id number] Date of Birth: 1946/01/19  [facility] CM/SW Contact:  Bartholomew Crews, RN Phone Number: [phone] 12/12/2018, 9:40 AM   Clinical Narrative:    Patient to transition home today. No transition of care needs identified at this time. Spouse to provide transportation home.      Barriers to Discharge: No Barriers Identified   Patient Goals and CMS Choice        Discharge Placement    Home                   Discharge Plan and Services                DME Arranged: N/A DME Agency: NA       HH Arranged: NA HH Agency: NA        Social Determinants of Health (SDOH) Interventions     Readmission Risk Interventions No flowsheet data found.

## 2018-12-12 NOTE — Discharge Summary (Signed)
Physician Discharge Summary   Patient ID: Francisco [name] [id number] 73 y.o. 1946/07/02  Admit date: 12/11/2018  Discharge date and time: 12/12/18   Admitting Physician: Elam Dutch, MD   Discharge Physician: Dr. Donzetta Matters  Admission Diagnoses: PAD (peripheral artery disease) [facility]) [I73.9]  Discharge Diagnoses: PAD Cath site hematoma  Admission Condition: fair  Discharged Condition: fair  Indication for Admission: post cath groin hematoma  Hospital Course: [name] is a 72 year old male who came in as an outpatient and underwent aortogram with right common iliac artery stent by [name] on 12/11/2018.  He tolerated the procedure well however postoperatively while in short stay he developed a right groin hematoma with concurrent soft blood pressure.  Blood pressure responded initially to fluid bolus.  Initial CBC did not demonstrate any significant drop that would indicate hemorrhage however CT abdomen pelvis without contrast was obtained.  This also did not show any retroperitoneal hematoma.  He was kept overnight to monitor right groin hematoma.  POD day #1 right groin is still tender to touch with local ecchymosis however is soft.  Patient is feeling much better compared to yesterday and is ready for discharge home.  CBC demonstrated a stable H&H.  He will follow-up with Dr. [name] practitioner under standard surveillance protocol.  He may resume all of his home medications today.  Discharge instructions were reviewed with the patient and he voices understanding.  He will be discharged home this morning in stable condition.  Consults: None  Treatments: surgery: Aortogram with right common iliac artery stents by [name] on 12/11/2018  Discharge Exam: See progress note 12/12/2018 Vitals:   12/12/18 0511 12/12/18 0728  BP:  (!) 156/92  Pulse:    Resp:  (!) 24  Temp: 97.6 F (36.4 C) 98.4 F (36.9 C)  SpO2:  97%     Disposition: Discharge disposition: 01-Home or  Self Care       Patient Instructions:  Allergies as of 12/12/2018      Reactions   Tape Other (See Comments)   SKIN IS VERY THIN; CAN ONLY USE COBAN WRAPS DUE TO CONDITION OF SKIN!!   Levaquin [levofloxacin]    hallucinations      Medication List    TAKE these medications   acetaminophen 500 MG tablet Commonly known as:  TYLENOL Take 1,000 mg by mouth 2 (two) times daily as needed for moderate pain.   albuterol 108 (90 Base) MCG/ACT inhaler Commonly known as:  Ventolin HFA USE 2 PUFFS EVERY 6 HOURS  AS NEEDED FOR WHEEZING What changed:    how much to take  how to take this  when to take this  reasons to take this  additional instructions   azithromycin 250 MG tablet Commonly known as:  ZITHROMAX TAKE 1 TABLET BY MOUTH ON  MONDAY, WEDNESDAY, AND  FRIDAY What changed:  See the new instructions.   budesonide-formoterol 160-4.5 MCG/ACT inhaler Commonly known as:  Symbicort USE 2 PUFFS TWO TIMES DAILY What changed:    how much to take  how to take this  when to take this  additional instructions   CALCIUM 600+D PO Take 2 tablets by mouth daily.   fluticasone 50 MCG/ACT nasal spray Commonly known as:  FLONASE USE 2 SPRAYS IN EACH  NOSTRIL DAILY   furosemide 20 MG tablet Commonly known as:  LASIX Take 1 tablet (20 mg total) by mouth daily as needed for fluid or edema.   mupirocin ointment 2 % Commonly known  as:  BACTROBAN Apply 1 application topically 2 (two) times daily. For 7-10 days What changed:    when to take this  reasons to take this  additional instructions   OXYGEN Inhale 2 L into the lungs See admin instructions.   predniSONE 10 MG tablet Commonly known as:  DELTASONE Take 1 tablet (10 mg total) by mouth daily with breakfast.   predniSONE 10 MG tablet Commonly known as:  DELTASONE 4 tabs for 2 days, then 3 tabs for 2 days, 2 tabs for 2 days, then 1 tab for 2 days, then stop   Pulse Oximeter For Finger Misc 1 Device by Does  not apply route as needed.   REDNESS RELIEVER EYE DROPS OP Place 1 drop into both eyes daily as needed (redness / itching).   rivaroxaban 20 MG Tabs tablet Commonly known as:  XARELTO Take 1 tablet (20 mg total) by mouth daily with supper. What changed:  when to take this   roflumilast 500 MCG Tabs tablet Commonly known as:  DALIRESP Take 1 tablet (500 mcg total) by mouth daily.   Spiriva Respimat 2.5 MCG/ACT Aers Generic drug:  Tiotropium Bromide Monohydrate USE 2 INHALATIONS BY MOUTH  ONCE DAILY What changed:  See the new instructions.   traMADol 50 MG tablet Commonly known as:  ULTRAM Take 1 tablet (50 mg total) by mouth every 6 (six) hours as needed for moderate pain or severe pain.      Activity: activity as tolerated Diet: regular diet Wound Care: none needed  Follow-up with [name] in 3 months.  SignedDagoberto Ligas 12/12/2018 9:06 AM

## 2018-12-12 NOTE — Care Management Obs Status (Signed)
[facility] NOTIFICATION   Patient Details  Name: Francisco [name] MRN: [id number] Date of Birth: July 27, 1945   Medicare Observation Status Notification Given:  Yes    Bartholomew Crews, RN 12/12/2018, 9:28 AM

## 2018-12-12 NOTE — Progress Notes (Signed)
PT provided discharge instructions and education. IV removed and intact. CCMD notified telebox removed. Pt assisted with dressing. Vital stable. Pt denies any complaints. Pt family notified of discharge. Pt has all belongings including home 02 backpack. Volunteers called to tx pt to via wheelchair to meet ride. Jerald Kief, RN

## 2018-12-12 NOTE — Progress Notes (Addendum)
  Progress Note    12/12/2018 8:27 AM 1 Day Post-Op  Subjective:  R groin feeling much better this morning   Vitals:   12/12/18 0511 12/12/18 0728  BP:  (!) 156/92  Pulse:    Resp:  (!) 24  Temp: 97.6 F (36.4 C) 98.4 F (36.9 C)  SpO2:  97%   Physical Exam: Lungs:  Non labored Incisions:  B groin cath sites without firm hematoma Extremities:  Feet warm to touch, edema BLE Abdomen:  Soft Neurologic: A&O  CBC    Component Value Date/Time   WBC 8.1 12/12/2018 0337   RBC 3.58 (L) 12/12/2018 0337   HGB 11.9 (L) 12/12/2018 0337   HCT 35.5 (L) 12/12/2018 0337   PLT 171 12/12/2018 0337   MCV 99.2 12/12/2018 0337   MCH 33.2 12/12/2018 0337   MCHC 33.5 12/12/2018 0337   RDW 12.0 12/12/2018 0337   LYMPHSABS 0.4 (L) 04/07/2018 1146   MONOABS 0.3 04/07/2018 1146   EOSABS 0.0 04/07/2018 1146   BASOSABS 0.1 04/07/2018 1146    BMET    Component Value Date/Time   NA 138 12/12/2018 0337   K 4.1 12/12/2018 0337   CL 103 12/12/2018 0337   CO2 28 12/12/2018 0337   GLUCOSE 104 (H) 12/12/2018 0337   BUN 10 12/12/2018 0337   CREATININE 0.99 12/12/2018 0337   CALCIUM 8.6 (L) 12/12/2018 0337   GFRNONAA >60 12/12/2018 0337   GFRAA >60 12/12/2018 0337    INR No results found for: INR   Intake/Output Summary (Last 24 hours) at 12/12/2018 0827 Last data filed at 12/12/2018 0600 Gross per 24 hour  Intake 2534.61 ml  Output -  Net 2534.61 ml     Assessment/Plan:  73 y.o. male is s/p R CIA stent who developed hematoma post op 1 Day Post-Op   R groin soft this morning Perfusing BLE well Office will call to arrange 3 month follow up per protocol D/c home this morning   Dagoberto Ligas, PA-C [facility] [phone] 12/12/2018 8:27 AM   I have independently interviewed and examined patient and agree with PA assessment and plan above.   Lealand Elting C. Donzetta Matters, MD [facility] [place] Office: [phone] Pager: [phone]

## 2018-12-14 ENCOUNTER — Encounter (HOSPITAL_COMMUNITY): Payer: Self-pay | Admitting: Vascular Surgery

## 2018-12-14 LAB — POCT ACTIVATED CLOTTING TIME: Activated Clotting Time: 274 seconds

## 2018-12-17 ENCOUNTER — Telehealth: Payer: Self-pay | Admitting: Internal Medicine

## 2018-12-17 NOTE — Telephone Encounter (Signed)
Please let me look at the forms. Thanks.

## 2018-12-17 NOTE — Telephone Encounter (Signed)
Received [employer] forms from MR's box that had been mailed to office by pt.  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 Thanks!

## 2018-12-17 NOTE — Telephone Encounter (Signed)
Forms signed by Francisco [name] Patient wrote note requesting to forward the forms to [place] faxed, patient is aware Forms sent for scan  Nothing further needed at this time; will sign off

## 2018-12-17 NOTE — Telephone Encounter (Signed)
[employer] forms given to TN.

## 2018-12-28 ENCOUNTER — Other Ambulatory Visit: Payer: Self-pay

## 2018-12-28 DIAGNOSIS — I739 Peripheral vascular disease, unspecified: Secondary | ICD-10-CM

## 2018-12-30 ENCOUNTER — Telehealth (HOSPITAL_COMMUNITY): Payer: Self-pay | Admitting: Rehabilitation

## 2018-12-30 NOTE — Telephone Encounter (Signed)

## 2018-12-31 ENCOUNTER — Ambulatory Visit (HOSPITAL_COMMUNITY)
Admission: RE | Admit: 2018-12-31 | Discharge: 2018-12-31 | Disposition: A | Discharge status: Home / Self Care | Payer: Medicare Other | Source: Ambulatory Visit | Attending: Vascular Surgery | Admitting: Vascular Surgery

## 2018-12-31 ENCOUNTER — Encounter: Payer: Self-pay | Admitting: Vascular Surgery

## 2018-12-31 ENCOUNTER — Ambulatory Visit (INDEPENDENT_AMBULATORY_CARE_PROVIDER_SITE_OTHER): Payer: Medicare Other | Admitting: Vascular Surgery

## 2018-12-31 ENCOUNTER — Other Ambulatory Visit: Payer: Self-pay

## 2018-12-31 VITALS — BP 150/83 | HR 100 | Temp 97.4°F | Resp 20 | Ht 72.0 in | Wt 172.0 lb

## 2018-12-31 DIAGNOSIS — I739 Peripheral vascular disease, unspecified: Secondary | ICD-10-CM | POA: Diagnosis not present

## 2018-12-31 NOTE — Progress Notes (Signed)
Patient is a 73 year old male who returns for follow-up today.  He recently underwent right common iliac artery stenting June fifth 2020.  This was done for a nonhealing wound on his right first toe.  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  He had minimal disease on the left leg.  Plan was to see how his wounds healed after addressing his common iliac stenosis.  He is not a very good operative candidate due to his underlying pulmonary dysfunction.  Consideration would be given for right femoral endarterectomy if his wounds failed to heal.  I am pleased to report that his first toe pain has completely resolved and the wound on his toe has completely healed.  He still has a pretibial ulcer but this is healing as well.  He has no problems in the left leg.  He still has chronic edema and does not tolerate compression with Ace wraps or compression stockings.  Physical exam:  Vitals:   12/31/18 1551  BP: (!) 150/83  Pulse: 100  Resp: 20  Temp: (!) 97.4 F (36.3 C)  SpO2: 98%  Weight: 172 lb (78 kg)  Height: 6' (1.829 m)    2+ femoral pulses bilaterally, the right femoral pulse is more palpable up above the inguinal ligament no hematoma or evidence of pseudoaneurysm  Skin feet are dusky bilaterally from the knee down similar to baseline  Data: Patient had bilateral ABIs performed today.  The right ABI has increased from 0.5-2.74.  His left ABI was 1 today improved from 0.84.  Assessment: Peripheral arterial disease healing wound right leg status post right common iliac artery stenting.  He does have evidence of common femoral obstruction as well.  However since his wounds have healed and he is not a very good operative candidate I believe close observation would be the best treatment for now.  Plan: The patient will follow-up in 3 months time with an aortoiliac duplex as well as bilateral ABIs and see 1 of our APP's.  Ruta Hinds, MD [facility] [place] Office: [phone] Pager: [phone]

## 2019-01-05 ENCOUNTER — Other Ambulatory Visit: Payer: Self-pay | Admitting: Family Medicine

## 2019-01-10 ENCOUNTER — Telehealth: Payer: Self-pay | Admitting: Pulmonary Disease

## 2019-01-10 NOTE — Telephone Encounter (Signed)
01/10/2019 2139  Lauren can we ensure the patient has follow up with [name] in August/2020.   [name] FNP

## 2019-01-11 NOTE — Telephone Encounter (Signed)
There is a recall for patient in August 2020 with MR. Will leave in box and check when time is closer.

## 2019-01-13 ENCOUNTER — Other Ambulatory Visit: Payer: Self-pay | Admitting: Internal Medicine

## 2019-01-15 ENCOUNTER — Other Ambulatory Visit: Payer: Self-pay | Admitting: Family Medicine

## 2019-01-15 NOTE — Telephone Encounter (Signed)
I have patient information - as soon as I have dates for MR in August I will contact pt -pr

## 2019-01-18 ENCOUNTER — Other Ambulatory Visit: Payer: Self-pay

## 2019-01-18 MED ORDER — FUROSEMIDE 20 MG PO TABS: 20.0000 mg | ORAL_TABLET | Freq: Every day | ORAL | 1 refills | Status: DC | PRN

## 2019-01-18 NOTE — Telephone Encounter (Signed)
Patient's chart shows this medication was refilled to [facility] on 7.8.2020  Called [facility] to verify - medication was received on the above date.  The Daliresp is not eligible to ship to the patient until 7.31.2020.  E-mail sent to patient with the above information.

## 2019-01-27 NOTE — Telephone Encounter (Signed)
Patrice aware to contact patient once schedule is available for PFT scheduling.

## 2019-01-29 NOTE — Telephone Encounter (Signed)
Noted. Thank you.   Francisco [name]

## 2019-02-09 NOTE — Telephone Encounter (Signed)
Triage and Patrice,  Do we have [name] scheduled for August/2020 yet?  This patient needs to be scheduled with him.    If we still do not have his schedule yet, triage please contact the patient and update him that we are still waiting to receive [name] scheduled.  If the patient is having any sort of acute or worsening symptoms we can always have him follow-up with me or an APP if he would like.  Or if he would like he can wait for [place] to receive the [name] dates and whenever we do we can contact him to get him scheduled.  [name], FNP

## 2019-02-11 NOTE — Telephone Encounter (Signed)
This is patient's preference.  We can discuss this as a video visit or can be in office?  If patient does not have a video visit capabilities then can be a tele-visit if the patient would prefer that.  If the patient would like to be seen in office that is okay to.  Just as long as he is afebrile, no exposures to SARS-CoV-2, and SARS-CoV-2 questions negative prior to office visit.[name], FNP

## 2019-02-11 NOTE — Telephone Encounter (Signed)
[name] doesn't have any schedule as of yet.-pr

## 2019-02-11 NOTE — Telephone Encounter (Signed)
Primary Pulmonologist: MR Last office visit and with whom: 12/10/2018 with [name] What do we see them for (pulmonary problems): COPD Last OV assessment/plan: Instructions    Return in about 2 months (around 02/09/2019), or if symptoms worsen or fail to improve, for Follow up with Dr. Purnell Shoemaker. Peri-operative Assessment of Pulmonary Risk for Non-Thoracic Surgery:  ForMr. Stern, risk of perioperative pulmonary complications is increased by:             Age greater than 57 years             COPD             Chronic PE   Respiratory complications generally occur in 1% of ASA Class I patients, 5% of ASA Class II and 10% of ASA Class III-IV patients These complications rarely result in mortality and iclude postoperative pneumonia, atelectasis, pulmonary embolism, ARDS and increased time requiring postoperative mechanical ventilation.  Overall, I recommend proceeding with the surgery if the risk for respiratory complications are outweighed by the potential benefits. This will need to be discussed between the patient and surgeon.  To reduce risks of respiratory complications, I recommend: --Pre- and post-operative incentive spirometry performed frequently while awake   I have discussed the risk factors and recommendations above with the patient.       Continue Symbicort160 >>> 2 puffs in the morning right when you wake up, rinse out your mouth after use, 12 hours later 2 puffs, rinse after use >>> Take this daily, no matter what >>> This is not a rescue inhaler  ContinueSpiriva Respimat 2.5 >>> 2 puffs daily >>> Do this every day >>>This is not a rescue inhaler  Continue daily prednisone 10mg    Continue daliresp daily as prescribed  Continue Monday Wednesday Friday azithromycin  Continue Keflex as prescribed  Continue Xarelto as prescribed    Note your daily symptoms >remember "red flags" for COPD:  >>>Increase in cough >>>increase in sputum  production >>>increase in shortness of breath or activity intolerance.   If you notice these symptoms, please call the office to be seen.   Return in about 2 months (around 02/09/2019), or if symptoms worsen or fail to improve, for Follow up with Dr. Purnell Shoemaker.     Was appointment offered to patient (explain)?  This is to get pt scheduled for an appt   Reason for call: Called and spoke with pt letting him know that we still do not have a schedule avail for MR and asked if he would be fine seeing an APP. Pt said it would be fine for him to see an APP and stated that we could get him scheduled for [name] and he said that would be fine.  Pt wanted to know if this visit would be an in office visit or if it would be a televisit. Pt believes that he is having a flare-up with his COPD as he usually gets at least once a month or so. Pt said that he is having a little more trouble with his breathing but has not had to increase his O2; still using 3L with exertion.  Pt denies any complaints of chest tightness, no fever. Just states that his breathing has become worse.  [name], please advise on this for pt. Thanks!

## 2019-02-11 NOTE — Telephone Encounter (Signed)
Called and spoke with pt letting him know that [name] said the OV could either be a televisit or in office visit whichever he preferred. Pt said to make it be a televisit. Went ahead and scheduled pt for visit tomorrow at 3pm with [name] due to possible COPD flare. Nothing further needed.

## 2019-02-12 ENCOUNTER — Encounter: Payer: Self-pay | Admitting: Pulmonary Disease

## 2019-02-12 ENCOUNTER — Ambulatory Visit (INDEPENDENT_AMBULATORY_CARE_PROVIDER_SITE_OTHER): Payer: Medicare Other | Admitting: Pulmonary Disease

## 2019-02-12 ENCOUNTER — Other Ambulatory Visit: Payer: Self-pay

## 2019-02-12 DIAGNOSIS — J438 Other emphysema: Secondary | ICD-10-CM

## 2019-02-12 DIAGNOSIS — J441 Chronic obstructive pulmonary disease with (acute) exacerbation: Secondary | ICD-10-CM

## 2019-02-12 MED ORDER — PREDNISONE 10 MG PO TABS: ORAL_TABLET | ORAL | 0 refills | Status: DC

## 2019-02-12 MED ORDER — DOXYCYCLINE HYCLATE 100 MG PO TABS: 100.0000 mg | ORAL_TABLET | Freq: Two times a day (BID) | ORAL | 0 refills | Status: DC

## 2019-02-12 NOTE — Assessment & Plan Note (Signed)
Plan: Doxycycline today Prednisone taper today Continue Symbicort 160, Spiriva Respimat 2.5, Daliresp Resume Monday Wednesday Friday azithromycin after completing course of doxycycline Resume 10 mg daily of prednisone after completing the taper provided today

## 2019-02-12 NOTE — Patient Instructions (Addendum)
Doxycycline >>> 1 100 mg tablet every 12 hours for 7 days >>>take with food  >>>wear sunscreen  >>>HOLD MWF Azithromycin   Prednisone 10mg  tablet  >>>4 tabs for 2 days, then 3 tabs for 2 days, 2 tabs for 2 days, then 1 tab daily as typically prescribed  >>>take with food  >>>take in the morning     Continue Symbicort160 >>> 2 puffs in the morning right when you wake up, rinse out your mouth after use, 12 hours later 2 puffs, rinse after use >>> Take this daily, no matter what >>> This is not a rescue inhaler  ContinueSpiriva Respimat 2.5 >>> 2 puffs daily >>> Do this every day >>>This is not a rescue inhaler  Continue daily prednisone 10mg  after taper   Continue daliresp daily as prescribed  Continue Monday Wednesday Friday azithromycin after doxy     Continue Xarelto as prescribed    Note your daily symptoms >remember "red flags" for COPD:  >>>Increase in cough >>>increase in sputum production >>>increase in shortness of breath or activity intolerance.   If you notice these symptoms, please call the office to be seen.   Return in about 6 weeks (around 03/26/2019), or if symptoms worsen or fail to improve, for Follow up with [name] FNP-C, Follow up with Dr. Purnell Shoemaker.    Coronavirus (COVID-19) Are you at risk?  Are you at risk for the Coronavirus (COVID-19)?  To be considered HIGH RISK for Coronavirus (COVID-19), you have to meet the following criteria:  . Traveled to [place], [place], [place], [place] or [place]; or in the [place] to [place], [place], [place], or [place]; and have fever, cough, and shortness of breath within the last 2 weeks of travel OR . Been in close contact with a person diagnosed with COVID-19 within the last 2 weeks and have fever, cough, and shortness of breath . IF YOU DO NOT MEET THESE CRITERIA, YOU ARE CONSIDERED LOW RISK FOR COVID-19.  What to do if you are HIGH RISK for COVID-19?  [name] Kitchen If you are  having a medical emergency, call 911. . Seek medical care right away. Before you go to a doctor's office, urgent care or emergency department, call ahead and tell them about your recent travel, contact with someone diagnosed with COVID-19, and your symptoms. You should receive instructions from your physician's office regarding next steps of care.  . When you arrive at healthcare provider, tell the healthcare staff immediately you have returned from visiting [place], [place], [place], [place] or [place]; or traveled in the [place] to [place], [place], [place], or [place]; in the last two weeks or you have been in close contact with a person diagnosed with COVID-19 in the last 2 weeks.   . Tell the health care staff about your symptoms: fever, cough and shortness of breath. . After you have been seen by a medical provider, you will be either: o Tested for (COVID-19) and discharged home on quarantine except to seek medical care if symptoms worsen, and asked to  - Stay home and avoid contact with others until you get your results (4-5 days)  - Avoid travel on public transportation if possible (such as bus, train, or airplane) or o Sent to the Emergency Department by EMS for evaluation, COVID-19 testing, and possible admission depending on your condition and test results.  What to do if you are LOW RISK for COVID-19?  Reduce your risk of any infection by using the  same precautions used for avoiding the common cold or flu:  [name] Kitchen Wash your hands often with soap and warm water for at least 20 seconds.  If soap and water are not readily available, use an alcohol-based hand sanitizer with at least 60% alcohol.  . If coughing or sneezing, cover your mouth and nose by coughing or sneezing into the elbow areas of your shirt or coat, into a tissue or into your sleeve (not your hands). . Avoid shaking hands with others and consider head nods or verbal greetings only. . Avoid touching your eyes, nose, or  mouth with unwashed hands.  . Avoid close contact with people who are sick. . Avoid places or events with large numbers of people in one location, like concerts or sporting events. . Carefully consider travel plans you have or are making. . If you are planning any travel outside or inside the [place], visit the CDC's Travelers' Health webpage for the latest health notices. . If you have some symptoms but not all symptoms, continue to monitor at home and seek medical attention if your symptoms worsen. . If you are having a medical emergency, call 911.   [place]uda / e-Visit: [email or web address]         [facility]: [facility]: [phone]                   [facility]: [phone]           It is flu season:   >>> Best ways to protect herself from the flu: Receive the yearly flu vaccine, practice good hand hygiene washing with soap and also using hand sanitizer when available, eat a nutritious meals, get adequate rest, hydrate appropriately   Please contact the office if your symptoms worsen or you have concerns that you are not improving.   Thank you for choosing [facility] for your healthcare, and for allowing [place] to partner with you on your healthcare journey. I am thankful to be able to provide care to you today.   [name] FNP-C

## 2019-02-12 NOTE — Progress Notes (Signed)
Virtual Visit via Telephone Note  I connected with Francisco [name] on 02/12/19 at  3:00 PM EDT by telephone and verified that I am speaking with the correct person using two identifiers.  Location: Patient: Home Provider: [facility] - [address], [place], [place], [place] [address]   I discussed the limitations, risks, security and privacy concerns of performing an evaluation and management service by telephone and the availability of in person appointments. I also discussed with the patient that there may be a patient responsible charge related to this service. The patient expressed understanding and agreed to proceed.  Patient consented to consult via telephone: Yes People present and their role in pt care: Pt    History of Present Illness: 73 year old male former smoker followed in our office for COPD  PMH: Hypertension, osteoporosis, chronic PE Smoker/ Smoking History: Former smoker.  Quit smoking in 2019.  104-pack-year smoking history. Maintenance: Symbicort 160, Spiriva Respimat 2.5, 10 mg prednisone daily, daliresp, Monday Wednesday Friday azithromycin Pt of: [name]  Chief complaint: Cough   73 year old male former smoker followed in our office for COPD.  Patient is maintained on Symbicort 160, Spiriva Respimat 2.5, 10 mg prednisone daily, Daliresp on Monday Wednesday Friday azithromycin.  Patient reporting that despite these measures he is imminent having increased need of using his rescue inhaler for over the last 2 to 3 weeks.  He reports he has been averaging using his rescue inhaler about 6 times daily.  He received a new inhaler with 200 doses and it on 01/09/2019.  He reports today he has 20 doses left.  Patient contacted our office to let us know he is still having shortness of breath as well as a cough with productive clear sputum.  Is difficult for him to bring up the sputum.  He is also wheezing.  Patient reporting that he feels like he is having  a COPD exacerbation.  Patient denies any interaction with anyone who is been positive for COVID or persons under surveillance.  Patient and spouse also reporting they are practicing social distancing, wearing a mask as well as quarantining themselves at home during the covid19 pandemic.  MMRC - Breathlessness Score 3 - I stop for breath after walking about 100 yards or after a few minutes on level ground (isle at grocery store is 157ft)   Observations/Objective:  04/07/2018-CTA- nonocclusive PE centrally involving branches of the right middle lobe and right lower lobe, no evidence of right heart strain  11/04/2017-echocardiogram-LV ejection fraction of 65 to 32%, grade 1 diastolic dysfunction  9/51/8841-YSAYTKZSW function test- FVC 4.59 (92% predicted), postbronchodilator ratio 29, postbronchodilator FEV1 1.41 (40% predicted), DLCO 72  11/27/2018-chest x-ray- no active cardiopulmonary disease, COPD  Assessment and Plan:  COPD with acute exacerbation (HCC) Plan: Doxycycline today Prednisone taper today Continue Symbicort 160, Spiriva Respimat 2.5, Daliresp Resume Monday Wednesday Friday azithromycin after completing course of doxycycline Resume 10 mg daily of prednisone after completing the taper provided today   COPD (chronic obstructive pulmonary disease) with emphysema ([place]) Plan: Continue outlined respiratory maintenance medications as instructed on AVS Follow-up with our office in 6 weeks Contact our office sooner if you are not getting better   Follow Up Instructions:  Return in about 6 weeks (around 03/26/2019), or if symptoms worsen or fail to improve, for Follow up with [name] FNP-C, Follow up with Dr. Purnell Shoemaker.   I discussed the assessment and treatment plan with the patient. The patient was provided an  opportunity to ask questions and all were answered. The patient agreed with the plan and demonstrated an understanding of the instructions.   The patient was advised  to call back or seek an in-person evaluation if the symptoms worsen or if the condition fails to improve as anticipated.  I provided 25  minutes of non-face-to-face time during this encounter.   Lauraine Rinne, NP

## 2019-02-12 NOTE — Assessment & Plan Note (Signed)
Plan: Continue outlined respiratory maintenance medications as instructed on AVS Follow-up with our office in 6 weeks Contact our office sooner if you are not getting better

## 2019-02-17 ENCOUNTER — Telehealth: Payer: Self-pay | Admitting: Internal Medicine

## 2019-02-22 NOTE — Telephone Encounter (Signed)
Pt has been able to be scheduled an appt with MR 8/28. Nothing further needed.

## 2019-03-03 ENCOUNTER — Other Ambulatory Visit: Payer: Self-pay | Admitting: Internal Medicine

## 2019-03-05 ENCOUNTER — Ambulatory Visit (INDEPENDENT_AMBULATORY_CARE_PROVIDER_SITE_OTHER): Payer: Medicare Other | Admitting: Internal Medicine

## 2019-03-05 ENCOUNTER — Telehealth: Payer: Self-pay | Admitting: Internal Medicine

## 2019-03-05 ENCOUNTER — Other Ambulatory Visit: Payer: Self-pay

## 2019-03-05 ENCOUNTER — Encounter: Payer: Self-pay | Admitting: Internal Medicine

## 2019-03-05 VITALS — BP 132/78 | HR 101 | Temp 97.7°F | Ht 72.0 in | Wt 161.8 lb

## 2019-03-05 DIAGNOSIS — J441 Chronic obstructive pulmonary disease with (acute) exacerbation: Secondary | ICD-10-CM

## 2019-03-05 MED ORDER — DOXYCYCLINE HYCLATE 100 MG PO TABS: 100.0000 mg | ORAL_TABLET | Freq: Two times a day (BID) | ORAL | 0 refills | Status: AC

## 2019-03-05 MED ORDER — PREDNISONE 10 MG PO TABS: ORAL_TABLET | ORAL | 0 refills | Status: DC

## 2019-03-05 MED ORDER — PREDNISONE 10 MG PO TABS: 10.0000 mg | ORAL_TABLET | Freq: Every day | ORAL | 3 refills | Status: DC

## 2019-03-05 NOTE — Patient Instructions (Addendum)
ICD-10-CM   1. COPD with acute exacerbation (HCC)  J44.1      Take doxycycline 100mg  po twice daily x 5 days; take after meals and avoid sunlight  Take prednisone 40 mg daily x 2 days, then 30mg  daily x 2 days and then 20mg  daily x 2 days, then 10mg  daily x to continue baseline the baseline  Continue spiriva, symbicort, daliresp, 3L O2 as before  High dose flu shot after labor day 2020  Cotinue low risk activities only for covid-19 exposure - wear mask when with people  Followup 3-6 months routine followup

## 2019-03-05 NOTE — Telephone Encounter (Signed)
Omega II mentioned in his office visit today that he was on azithromycin 3 times a week.  I do not recollect prescribing this to him although I might have.  He is on Roflumilast for COPD exacerbation prevention.  Therefore I do not see why he should also still be on azithromycin.  May be because his COPD exacerbations of frequent.  In any event this does not seem to be in his MAR at this moment   Could you please help [place] out by reviewing his chart and seeing when this was started and by whom and when his last intake was in the indication and let me know.  Therefore I can make a decision about this drug thank you     SIGNATURE    Dr. Brand Males, M.D., F.C.C.P,  Pulmonary and Critical Care Medicine Staff Physician, [place] Director - Interstitial Lung Disease  Program  [facility] [place] [facility] [place], [place], [address]  Pager: [phone], If no answer or between  15:00h - 7:00h: call [phone] Telephone: [phone]  1:02 PM 03/05/2019

## 2019-03-05 NOTE — Progress Notes (Signed)
ROV 12/30/16 -- patient has a history of tobacco use, COPD currently maintained on chronic prednisone, Symbicort, Spiriva. Most recent blurry function testing was March 2011 with an FEV1 of 1.2 L (34% Pred), severe obstruction. He up titrates his prednisone on his own based on symptoms. No abx since last time. He has increased pred temporrily about 4 -5 x since last time. No increases for over a month. He is smoking a pipe, not cigarettes. He is using HCTZ more frequently these days. He is on flonase and singulair. He does note that he has some slow progression of his DOE. He coughs a few times a day, clears clear sputum.   rov 07/17/17 --this is a follow-up visit for patient with active tobacco use, severe COPD and chronic prednisone use.  He often titrates his prednisone on his own depending on how he is feeling. He complains of a new pain in his back below his left shoulder blade, has been present for the last 10 days. sometimes pleuritic, can be stabbing, worse w cough. His pred use: on 10mg  for the last 19 days. No hx VTE. He has LE edema, venous stasis changes, increased since his diuretics decreased.   ROV 09/23/17 --73 year old man with a history of tobacco use (still smokes pipe), severe COPD and severe obstruction on spirometry.  At his last visit he had some pleuritic left back pain and I performed a CT PA that I have reviewed from 07/17/17.  There was no pulmonary embolism but extensive emphysema present, some bilateral apical scar more so on the right, stable.  He uses prednisone 10 mg daily and uptitrate depending on his day-to-day symptoms.  He is otherwise managed on Symbicort and Spiriva.  Desaturated on arrival today after ambulating to the exam room. He reports more dyspnea, has increased pred intermittently.    OV 12/05/2017  Chief Complaint  Patient presents with   Follow-up    Switching from RB to MR.   Pt is on 3L with exertion and 2L at rest. Pt states his breathing had  become worse but since he has been on O2 since 3/21, it has really helped with his breathing.  Pt was in hospital 4/28-5/3. Other than SOB, pt does have c/o cough, rattling in chest. Denies any CP/chest tightness.   History from patient, his wife and review of the old chart  Talmadge Coventry IIIs a transfer of care from [name] to myself [name].  His son is my patient and therefore he is done this transfer of care.  According to the patient he is to be seen by [name] for COPD.  Patient is FEV1 34% based on March 2011 PFTs according to chart review.  At baseline he is maintained on Spiriva, Symbicort, Singulair, chronic daily prednisone 10 mg/day for at least 3 years, and 3 times a week of azithromycin for 2 years.  Earlier this year in March 2019 he went on daytime oxygen and following a recent hospitalization April-May 2019.  For syncope that was believed due to nocturnal hypoxemia he was started on night oxygen.  Since starting oxygen his edema has improved and his overall quality of life is improved and he stopped having any orthostasis or presyncopal episodes.  He is grateful for the fact that he is on oxygen.  His current COPD CAT score and severity of symptoms is rated below and is 26.  Show significant amount of symptom burden.  His main goal  is to improve his quality of life.  He had his wife have many questions centering around quality of life, medication therapy.  Of note he has had some thoracic vertebral fractures that are new.  This happened after the fall in late April 2019.  Discovered at admission last month.  He feels is due to prednisone.  He is wondering about portable oxygen.   OV 01/29/2018  Chief Complaint  Patient presents with   Follow-up    Pt states he has been doing okay since last visit. States breathing is about the same, has an occ cough but states the O2 has helped out a lot with breathing and cough. Denies any complaints of CP.   Lesia Sago II , 73 y.o. ,  with dob 1946/03/26 and male ,Not Hispanic or Latino from [place] [address] - presents to lung clinic for advanced COPD follow-up. Since his last visit his symptoms course of improvement. Score is 26 and severe symptoms of document below. He is on Spiriva, Symbicort, continues oxygen, daily prednisone and azithromycin 3 times a week. He has stopped his singulair without any problems. Overall he says he is better. He is willing to give TRELEGY inhaler trial. Last visit to check blood gas and he does not have hypercapnia so he does not qualify for BiPAP        OV 04/03/2018  Subjective:  Patient ID: Marcelyn Bruins, male , DOB: 04-17-1946 , age 43 y.o. , MRN: [id number] , ADDRESS: [place] [address]   04/03/2018 -   Chief Complaint  Patient presents with   Acute Visit    Pt's O2 sats have been dropping into the 70s with little exertion.  Pt took O2 off just to shave 9/26 and after he finished shaving put the O2 back on, walked from the bathroom down to the living room on O2 and sats were at 77% on 3L 9/26. Pt also has c/o cough with white to yellow mucus and chest tightness with the SOB.     HPI PRECILIANO CASTELL II 73 y.o. -acute visit for this patient.  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  He is chronically prednisone dependent.  He also is on schedule azithromycin.  He called in yesterday feeling unwell therefore we asked him to come in today.  He tells me that approximately a week ago he started having increased cough and congestion.  Yesterday primary care physician following a routine visit thought he was in COPD exacerbation and started him on doxycycline.  He also personally bumped up his baseline prednisone.  He does me that yesterday while he was shaving on room air he felt more short of breath than usual.  Also when he walked he desaturated more than usual therefore he decided to call in and come in today.   There is no fever or chills or hemoptysis or colored sputum.  No leg edema.  No orthopnea.  He does not feel like he needs to be in the emergency department or get admitted.   OV 05/11/2018  Subjective:  Patient ID: Marcelyn Bruins, male , DOB: 06-28-1946 , age 89 y.o. , MRN: [id number] , ADDRESS: [place] [id number]   05/11/2018 -   Chief Complaint  Patient presents with   Follow-up    pt states breathing has wrosen since last OV. increased sob, occ chest tightness, prod cough  with yellow mucus & chest congestion. currently taking trelegy samples. recent admission 04/07/18 for PE.     HPI Lesia Sago II 73 y.o. -advanced COPD with chronic hypoxemic respiratory failure [not a BiPAP candidate because of lack of hypercapnia] presents with his wife for follow-up.  After the last visit he continued to have symptoms so on April 07, 2018 he got an outpatient CT scan and he had pulmonary embolism.  He got admitted to the hospital and then discharged and since then has been on Xarelto.  He continues his baseline 2-3 L of nasal cannula oxygen but he says since then he is more symptomatic.  Is also corresponds with him starting Trelegy inhaler and also for the last few to several weeks is having increased cough and congestion and yellow phlegm.  His COPD CAT score is deteriorated to 35 and above.  He is very frustrated by his symptoms.  Review of his medication shows that he takes azithromycin and chronic prednisone and Trelegy and oxygen for his COPD.  He is on Xarelto for his blood clots.  He is also on Fosamax for prednisone dependent osteoporosis management.  His smoking is in remission.  Wife says he is always sedentary.  Apparently they tried to do some physical therapy a month ago but he desaturated.  He does not seem to keen on physical therapy right now     OV 06/10/2018  Subjective:  Patient ID: Marcelyn Bruins, male , DOB: 07/08/1946 , age 61 y.o. , MRN: [id number] ,  ADDRESS: [place] [address]   06/10/2018 -   Chief Complaint  Patient presents with   Follow-up    follows for COPD. patient has increased SOB with exertion     HPI Lesia Sago II 73 y.o. -returns for gold stage IV COPD follow-up with chronic hypoxemic respiratory failure.  I saw him approximately a month ago at which time recommended Trelegy.  However the Trelegy is not working well for him.  He feels Spiriva and Symbicort is of benefit for him.  Also recommended he start himself on Daliresp.  Given the GI side effect profile I communicated with his primary care physician about stopping Fosamax.  His primary care physician has advised Reclast infusion for him and is considering this but has not yet switched over.  This because the infusions will have to happen at [facility].  However he never started his Daliresp because he read the side effect profile and was worried about back pain.  He wanted me to go over the side effect profile of Daliresp all over again.  I printed up-to-date and went over all the side effects.  I did this with him and his wife.  There is a 3% incidence of backache.  The dominant feature is GI issues of weight loss and diarrhea and nausea and abdominal pain.  The second dominant feature is some anxiety issues.  After reading all this he has decided to start [place].  He is aware of the limitations as well.  He is aware that this is purely preventive in reducing COPD exacerbation frequency.  He understands that the burden on his health from frequent COPD exacerbations is high.  Currently COPD CAT score is back at baseline        OV 07/22/2018  Subjective:  Patient ID: Marcelyn Bruins, male , DOB: 15-Aug-1945 , age 49 y.o. , MRN: [id number] , ADDRESS: [place]  [id number]   07/22/2018 -   Chief Complaint  Patient presents with   Follow-up    Pt states it has been rough since last visit. States he has been coughing with  white to yellow phlegm, wheezing, increased SOB which has been going on x2-3 weeks now. Pt denies any real complaints of chest tightness/chest pain.     HPI KOLTIN WEHMEYER II 73 y.o. -presents for follow-up of his advanced COPD with chronic hypoxemic respiratory failure.  At last visit we introduce Daliresp as is an effort to prevent COPD flareups.  After some trepidation he started taking it.  He is currently on full dose 5 mcg daily and is tolerating it well.  However he does not think it is reduced the frequency of flareups but again it is too soon to tell.  Approximately over a week ago after some sick contact exposure he had symptoms and signs of COPD exacerbation.  He was given I suspect Levaquin but this caused some confusion.  After that he was changed to doxycycline.  Today's his last of 7 days with doxycycline he is better.  COPD CAT score is improved to 26 but he still is coughing quite a bit and has mucus and feels that he is still not fully back to baseline.  He had a chest x-ray July 13, 2018 that reports this is clear.  He continues on oxygen Spiriva and Symbicort.  There are no other new issues.  No chest pain edema hemoptysis or weight loss.    OV 03/05/2019  Subjective:  Patient ID: Marcelyn Bruins, male , DOB: [birth year] , age 60 y.o. , MRN: [id number] , ADDRESS: [place] [address]   03/05/2019 -   Chief Complaint  Patient presents with   COPD with acute exacerbation    Feels it is a little worse since June. Has cough with congestion, was white in color. But this morning it has a yellow and green color to it.     HPI LADON VANDENBERGHE II 73 y.o. -returns for his advanced COPD follow-up.  He says that since his last visit he has been doing overall fair and stable.  Uses 3 L of oxygen, Daliresp, Spiriva, Symbicort.  He has been avoiding risk activities for COVID-19.  He says in the last few weeks he has had increased congestion he does not think this is  COVID-19.  Today he has green-yellow sputum.  He is asking about taking a flu shot.     CAT COPD Symptom & Quality of Life Score (GSK trademark) 0 is no burden. 5 is highest burden 12/05/2017  01/29/2018  05/11/2018  06/10/2018  07/22/2018  03/05/2019   Never Cough -> Cough all the time 3 1 5 5 2 3   No phlegm in chest -> Chest is full of phlegm 2 2 4 5 3  3.5  No chest tightness -> Chest feels very tight 1 0 3 0 1 2  No dyspnea for 1 flight stairs/hill -> Very dyspneic for 1 flight of stairs 5 4 5 5 5 5   No limitations for ADL at home -> Very limited with ADL at home 5 5 5 5 5 5   Confident leaving home -> Not at all confident leaving home 3 3 3 4 4 3   Sleep soundly -> Do not sleep soundly because of lung condition 3 1 2  0 1 4  Lots of Energy -> No energy at all 4 4 5 5  5  4  TOTAL Score (max 40)  26 20 37 29 26 29.5    ROS - per HPI     has a past medical history of Chest x-ray abnormality, Chronic back pain, Chronic rhinitis, Compressed spine fracture (HCC), COPD (chronic obstructive pulmonary disease) ([place]), Emphysema lung ([place]), On home oxygen therapy, Onychomycosis, Orthostatic hypotension, PAD (peripheral artery disease) ([place]), Pneumonia, Pulmonary embolism ([place]) (04/07/2018), Skin cancer, and Vertigo.   reports that he quit smoking about 16 months ago. His smoking use included pipe and cigarettes. He started smoking about 53 years ago. He has a 104.00 pack-year smoking history. He has never used smokeless tobacco.  Past Surgical History:  Procedure Laterality Date   APPENDECTOMY     LOWER EXTREMITY ANGIOGRAPHY  12/11/2018   LOWER EXTREMITY ANGIOGRAPHY N/A 12/11/2018   Procedure: LOWER EXTREMITY ANGIOGRAPHY;  Surgeon: Elam Dutch, MD;  Location: [facility];  Service: Cardiovascular;  Laterality: N/A;   PERIPHERAL VASCULAR INTERVENTION Right 12/11/2018   Procedure: PERIPHERAL VASCULAR INTERVENTION;  Surgeon: Elam Dutch, MD;  Location: [facility];   Service: Cardiovascular;  Laterality: Right;  Common Iliac    SKIN CANCER EXCISION     "lips, face, ears, arms" (04/07/2018)   TONSILLECTOMY      Allergies  Allergen Reactions   Tape Other (See Comments)    SKIN IS VERY THIN; CAN ONLY USE COBAN WRAPS DUE TO CONDITION OF SKIN!!   Levaquin [Levofloxacin]     hallucinations    Immunization History  Administered Date(s) Administered   Influenza Split 04/24/2012   Influenza Whole 05/08/2009, 04/08/2011   Influenza, High Dose Seasonal PF 05/01/2017, 03/31/2018   Influenza,inj,Quad PF,6+ Mos 04/27/2013, 04/15/2014, 04/24/2015   Influenza,inj,quad, With Preservative 05/01/2018   Influenza-Unspecified 05/13/2016   Pneumococcal Conjugate-13 02/25/2014   Pneumococcal Polysaccharide-23 08/15/2011, 12/19/2016   Pneumococcal-Unspecified 03/08/2017   Td 02/21/2010   Tdap 11/05/2017   Zoster 01/29/2012    Family History  Problem Relation Age of Onset   Heart disease Mother        CABG in her 53s, nonsmoker   Cancer Mother    Stroke Father    Heart disease Father        Died of MI at age 38, smoker   Hepatitis Sister    Coronary artery disease Other        male 1st degree relative <60     Current Outpatient Medications:    acetaminophen (TYLENOL) 500 MG tablet, Take 1,000 mg by mouth 2 (two) times daily as needed for moderate pain., Disp: , Rfl:    albuterol (VENTOLIN HFA) 108 (90 Base) MCG/ACT inhaler, USE 2 PUFFS EVERY 6 HOURS  AS NEEDED FOR WHEEZING (Patient taking differently: Inhale 2 puffs into the lungs every 6 (six) hours as needed for wheezing or shortness of breath. ), Disp: 3 Inhaler, Rfl: 3   budesonide-formoterol (SYMBICORT) 160-4.5 MCG/ACT inhaler, USE 2 PUFFS BY MOUTH TWO  TIMES DAILY, Disp: 30.6 g, Rfl: 2   Calcium Carbonate-Vitamin D (CALCIUM 600+D PO), Take 2 tablets by mouth daily., Disp: , Rfl:    DALIRESP 500 MCG TABS tablet, TAKE 1 TABLET BY MOUTH  DAILY, Disp: 90 tablet, Rfl: 2    fluticasone (FLONASE) 50 MCG/ACT nasal spray, USE 2 SPRAYS IN EACH  NOSTRIL DAILY (Patient taking differently: Place 2 sprays into both nostrils daily. ), Disp: 48 g, Rfl: 1   furosemide (LASIX) 20 MG tablet, Take 1 tablet (20 mg total) by mouth daily as needed for  fluid or edema., Disp: 90 tablet, Rfl: 1   Misc. Devices (PULSE OXIMETER FOR FINGER) MISC, 1 Device by Does not apply route as needed., Disp: 1 each, Rfl: 0   OXYGEN, Inhale 3 L into the lungs See admin instructions. , Disp: , Rfl:    predniSONE (DELTASONE) 10 MG tablet, Take 1 tablet (10 mg total) by mouth daily with breakfast., Disp: 90 tablet, Rfl: 3   SPIRIVA RESPIMAT 2.5 MCG/ACT AERS, USE 2 INHALATIONS BY MOUTH  ONCE DAILY, Disp: 12 g, Rfl: 3   Tetrahydrozoline HCl (REDNESS RELIEVER EYE DROPS OP), Place 1 drop into both eyes daily as needed (redness / itching)., Disp: , Rfl:    traMADol (ULTRAM) 50 MG tablet, Take 1 tablet (50 mg total) by mouth every 6 (six) hours as needed for moderate pain or severe pain., Disp: 30 tablet, Rfl: 1   XARELTO 20 MG TABS tablet, TAKE 1 TABLET BY MOUTH  DAILY WITH SUPPER, Disp: 90 tablet, Rfl: 0      Objective:   Vitals:   03/05/19 0841  BP: 132/78  Pulse: (!) 101  Temp: 97.7 F (36.5 C)  SpO2: 96%  Weight: 161 lb 12.8 oz (73.4 kg)  Height: 6' (1.829 m)    Estimated body mass index is 21.94 kg/m as calculated from the following:   Height as of this encounter: 6' (1.829 m).   Weight as of this encounter: 161 lb 12.8 oz (73.4 kg).  @WEIGHTCHANGE @  [employer]   03/05/19 0841  Weight: 161 lb 12.8 oz (73.4 kg)     Physical Exam  General Appearance:    Alert, cooperative, no distress, appears stated age - older , Deconditioned looking - yes , OBESE  - no, Sitting on Wheelchair -  yes  Head:    Normocephalic, without obvious abnormality, atraumatic  Eyes:    PERRL, conjunctiva/corneas clear,  Ears:    Normal TM's and external ear canals, both ears  Nose:   Nares normal,  septum midline, mucosa normal, no drainage    or sinus tenderness. OXYGEN ON  - yes . Patient is @ 3L [place]   Throat:   Lips, mucosa, and tongue normal; teeth and gums normal. Cyanosis on lips - no  Neck:   Supple, symmetrical, trachea midline, no adenopathy;    thyroid:  no enlargement/tenderness/nodules; no carotid   bruit or JVD  Back:     Symmetric, no curvature, ROM normal, no CVA tenderness  Lungs:     Distress - no , Wheeze mild scatteed, Barrell Chest - yes, Purse lip breathing - yes, Crackles - no   Chest Wall:    No tenderness or deformity.    Heart:    Regular rate and rhythm, S1 and S2 normal, no rub   or gallop, Murmur - no  Breast Exam:    NOT DONE  Abdomen:     Soft, non-tender, bowel sounds active all four quadrants,    no masses, no organomegaly. Visceral obesity - no  Genitalia:   NOT DONE  Rectal:   NOT DONE  Extremities:   Extremities - normal, Has Cane - no, Clubbing - no, Edema - no  Pulses:   2+ and symmetric all extremities  Skin:   Stigmata of Connective Tissue Disease - no  Lymph nodes:   Cervical, supraclavicular, and axillary nodes normal  Psychiatric:  Neurologic:   Pleasant - yes, Anxious - no, Flat affect - no  CAm-ICU - neg, Alert and Oriented x 3 -  yes, Moves all 4s - yes, Speech - normal, Cognition - intact           Assessment:       ICD-10-CM   1. COPD with acute exacerbation ([place])  J44.1        Plan:     Patient Instructions     ICD-10-CM   1. COPD with acute exacerbation (HCC)  J44.1      Take doxycycline 100mg  po twice daily x 5 days; take after meals and avoid sunlight  Take prednisone 40 mg daily x 2 days, then 20mg  daily x 2 days, then 10mg  daily x to continue baseline  Continue spiriva, symbicort, daliresp, 3L O2 as before  High dose flu shot after labor day 2020  [place] low risk activities only for covid-19 exposure - wear mask when with people  Followup 3-6 months routine followup     SIGNATURE    Dr. Brand Males, M.D., F.C.C.P,  Pulmonary and Critical Care Medicine Staff Physician, [place] Director - Interstitial Lung Disease  Program  Pulmonary [place] at [place], [place], [address]  Pager: [phone], If no answer or between  15:00h - 7:00h: call [phone] Telephone: [phone]  9:26 AM 03/05/2019

## 2019-03-05 NOTE — Telephone Encounter (Signed)
[name]  Do you know anything of MILANO ROSEVEAR II  =being on long term 3x/week azithromycin? It was not on his MAR  Thanks  MR

## 2019-03-05 NOTE — Telephone Encounter (Signed)
[name],  It appears that he was started on azithromycin in November 2015 by Dr. Annamaria Boots as an airway anti-inflammatory along with Daliresp due to frequent exacerbations.  It was resumed by [name] in December as patient noticed improvement in symptoms with the azithromycin and Daliresp on board.    He was on Daliresp and azithromycin from November 2015 to December 2015. He discontinued due to GI effects.    Azithromycin was last prescribed by yourself on 07/24/2018 for a 3 month supply but was not mentioned in the note. Last note that discussed azithromycin was on 05/11/18 and patient was instructed to continue azithromycin and start [place].  Please let me know if I can be of further assistance.  Mariella Saa, PharmD, [place], [place] Clinical Specialty Pharmacist [phone]  03/05/2019 2:44 PM

## 2019-03-05 NOTE — Telephone Encounter (Signed)
02/25/2019 1000  To be honest this does not drop my memory off the top of my head.  It looks like he was trialed on it in 2015.  I believe the patient reported that he was taking this.  I believe the one time that I saw him in office he was taking Keflex daily for vascular infection.  This was prior to him seeing Dr. fields and having surgery.  With his recurrent COPD exacerbations I do not think it would be a bad idea to consider. If EKG stable etc.   He may have potentially been confused regarding what he was taking.  I can addend my note if this is an accurate and he was actually not taking this.  [name] FNP

## 2019-03-06 NOTE — Telephone Encounter (Signed)
Thanks.  Please let him know that if he is tolerting it he can cntinue it along with daliresp but also can give a trial without it    SIGNATURE    Dr. Brand Males, M.D., F.C.C.P,  Pulmonary and Critical Care Medicine Staff Physician, [place] Director - Interstitial Lung Disease  Program  Pulmonary [place] at [place], [place], [address]  Pager: [phone], If no answer or between  15:00h - 7:00h: call [phone] Telephone: [phone]  2:32 PM 03/06/2019

## 2019-03-12 NOTE — Telephone Encounter (Signed)
Called patient to discuss azithromycin use.  No answer.  Left Voicemail.  Will follow up at a later time.

## 2019-03-18 NOTE — Telephone Encounter (Signed)
Thanks Museum/gallery conservator. Reply not needed    SIGNATURE    Dr. Brand Males, M.D., F.C.C.P,  Pulmonary and Critical Care Medicine Staff Physician, [place] Director - Interstitial Lung Disease  Program  Pulmonary [place] at [place], [place], [address]  Pager: [phone], If no answer or between  15:00h - 7:00h: call [phone] Telephone: [phone]  12:30 PM 03/18/2019

## 2019-03-19 ENCOUNTER — Telehealth (HOSPITAL_COMMUNITY): Payer: Self-pay | Admitting: *Deleted

## 2019-03-19 ENCOUNTER — Other Ambulatory Visit: Payer: Self-pay

## 2019-03-19 DIAGNOSIS — I739 Peripheral vascular disease, unspecified: Secondary | ICD-10-CM

## 2019-03-19 NOTE — Telephone Encounter (Signed)
The above patient or their representative was contacted and gave the following answers to these questions:         Do you have any of the following symptoms?n  Fever                    Cough                   Shortness of breath  Do  you have any of the following other symptoms? n  muscle pain         vomiting,        diarrhea        rash         weakness        red eye        abdominal pain         bruising          bruising or bleeding              joint pain           severe headache    Have you been in contact with someone who was or has been sick in the past 2 weeks?n  Yes                 Unsure                         Unable to assess   Does the person that you were in contact with have any of the following symptoms?   Cough         shortness of breath           muscle pain         vomiting,            diarrhea            rash            weakness           fever            red eye           abdominal pain           bruising  or  bleeding                joint pain                severe headache               Have you  or someone you have been in contact with traveled internationally in th last month?         If yes, which countries?   Have you  or someone you have been in contact with traveled outside [place] in th last month?         If yes, which state and city?   COMMENTS OR ACTION PLAN FOR THIS PATIENT:         q 

## 2019-03-22 ENCOUNTER — Other Ambulatory Visit: Payer: Self-pay | Admitting: Family Medicine

## 2019-03-22 ENCOUNTER — Ambulatory Visit (HOSPITAL_COMMUNITY)
Admission: RE | Admit: 2019-03-22 | Discharge: 2019-03-22 | Disposition: A | Discharge status: Home / Self Care | Payer: Medicare Other | Source: Ambulatory Visit | Attending: Family | Admitting: Family

## 2019-03-22 ENCOUNTER — Encounter (HOSPITAL_COMMUNITY): Payer: Medicare Other

## 2019-03-22 ENCOUNTER — Other Ambulatory Visit: Payer: Self-pay

## 2019-03-22 ENCOUNTER — Ambulatory Visit (INDEPENDENT_AMBULATORY_CARE_PROVIDER_SITE_OTHER)
Admission: RE | Admit: 2019-03-22 | Discharge: 2019-03-22 | Disposition: A | Discharge status: Home / Self Care | Payer: Medicare Other | Source: Ambulatory Visit | Attending: Family | Admitting: Family

## 2019-03-22 ENCOUNTER — Telehealth: Payer: Self-pay

## 2019-03-22 DIAGNOSIS — I739 Peripheral vascular disease, unspecified: Secondary | ICD-10-CM | POA: Diagnosis not present

## 2019-03-22 NOTE — Telephone Encounter (Signed)
Pt had test done in the lab today and called back to mention that his middle toe on his right foot is black and he thinks that his virtual visit should be changed to an in person visit.   Appt changed for pt to be seen in office.   York Cerise, CMA

## 2019-03-26 ENCOUNTER — Other Ambulatory Visit: Payer: Self-pay

## 2019-03-26 ENCOUNTER — Encounter: Payer: Self-pay | Admitting: Family

## 2019-03-26 ENCOUNTER — Ambulatory Visit (INDEPENDENT_AMBULATORY_CARE_PROVIDER_SITE_OTHER): Payer: Medicare Other | Admitting: Family

## 2019-03-26 VITALS — BP 137/80 | HR 117 | Temp 97.8°F | Resp 14 | Ht 72.0 in | Wt 161.0 lb

## 2019-03-26 DIAGNOSIS — I96 Gangrene, not elsewhere classified: Secondary | ICD-10-CM

## 2019-03-26 DIAGNOSIS — I739 Peripheral vascular disease, unspecified: Secondary | ICD-10-CM

## 2019-03-26 NOTE — Progress Notes (Signed)
[facility] [place]   CC: Follow up peripheral artery occlusive disease and evaluation of dark right 3rd toe  History of Present Illness Francisco [name] is a 73 y.o. male who is s/p right common iliac artery stenting on 12-11-18 by [name].  This was done for a nonhealing wound on his right first toe.  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  He had minimal disease on the left leg.  Plan was to see how his wounds healed after addressing his common iliac stenosis.  [name] indicated in his follow up note on 12-31-18 that pt is not a very good operative candidate due to his underlying pulmonary dysfunction.  Consideration would be given for right femoral endarterectomy if his wounds failed to heal.    At pt 12-31-18 visit, pt first toe pain had completely resolved and the wound on his toe had completely healed.  He still had a pretibial ulcer but this was healing as well.  At that visit pt had no problems in the left leg.  He still had chronic edema and did not tolerate compression with Ace wraps or compression stockings. Since his wounds had healed and he is not a very good operative candidate [name] indicated that close observation would be the best treatment for now. The patient was to follow-up in 3 months with an aortoiliac duplex as well as bilateral ABIs and see 1 of our APP's.  Pt returns today with c/o dark right 3rd toe x 1 month, is becoming more dark over time per wife, intermittently painful.  He has dependent edema in his feet and lower legs.   Pt denies any hx of stroke or MI.   Serum creatinine was 0.99 on 12-12-18.    Diabetic: No Tobacco use: former smoker, quit April 2019, started in 1967  Pt meds include: Statin :No Betablocker: No ASA: No Other anticoagulants/antiplatelets: Xarelto, pt states that he takes for his hx of PE  Past Medical History:  Diagnosis Date  . Chest x-ray abnormality   . Chronic  back pain    "mid and lower" (04/07/2018)  . Chronic rhinitis    -Sinus Ct 08/01/2009 >> Bilateral maxillary sinusitis with some mucosal thickeningin the sphenoid and frontal sinuses as well with air fluid levels present -chronic rhinitis flyer Aug 04, 2009  . Compressed spine fracture ([place])   . COPD (chronic obstructive pulmonary disease) [facility])    PFT's rec Jul 17, 2009  . Emphysema lung ([place])   . On home oxygen therapy    "3L; 24/7" (04/07/2018)  . Onychomycosis    Dr. Judi Cong  . Orthostatic hypotension    "since 10/2017" (04/07/2018)  . PAD (peripheral artery disease) ([place])   . Pneumonia    "twice in 1 year" (04/07/2018)  . Pulmonary embolism ([place]) 04/07/2018  . Skin cancer    "lips, face, ears, arms" (04/07/2018)  . Vertigo    "since ~ 02/2018" (04/07/2018)    Social History Social History   Tobacco Use  . Smoking status: Former Smoker    Packs/day: 2.00    Years: 52.00    Pack years: 104.00    Types: Pipe, Cigarettes    Start date: 10/06/1965    Quit date: 10/06/2017    Years since quitting: 1.4  . Smokeless tobacco: Never Used  . Tobacco comment: 04/07/2018 "smoked cigarettes years ago; stopped ~ 20 yr ago; stopped smoking pipe in 09/2017"  Substance Use  Topics  . Alcohol use: Yes    Alcohol/week: 28.0 standard drinks    Types: 28 Cans of beer per week    Comment: "04/07/2018 "4-5 beers/day; probably closer to 28/wk"  . Drug use: No    Family History Family History  Problem Relation Age of Onset  . Heart disease Mother        CABG in her 79s, nonsmoker  . Cancer Mother   . Stroke Father   . Heart disease Father        Died of MI at age 81, smoker  . Hepatitis Sister   . Coronary artery disease Other        male 1st degree relative <60    Past Surgical History:  Procedure Laterality Date  . APPENDECTOMY    . LOWER EXTREMITY ANGIOGRAPHY  12/11/2018  . LOWER EXTREMITY ANGIOGRAPHY N/A 12/11/2018   Procedure: LOWER EXTREMITY ANGIOGRAPHY;  Surgeon: Elam Dutch,  MD;  Location: [place] [facility];  Service: Cardiovascular;  Laterality: N/A;  . PERIPHERAL VASCULAR INTERVENTION Right 12/11/2018   Procedure: PERIPHERAL VASCULAR INTERVENTION;  Surgeon: Elam Dutch, MD;  Location: [place] CV [facility];  Service: Cardiovascular;  Laterality: Right;  Common Iliac   . SKIN CANCER EXCISION     "lips, face, ears, arms" (04/07/2018)  . TONSILLECTOMY      Allergies  Allergen Reactions  . Tape Other (See Comments)    SKIN IS VERY THIN; CAN ONLY USE COBAN WRAPS DUE TO CONDITION OF SKIN!!  . Levaquin [Levofloxacin]     hallucinations    Current Outpatient Medications  Medication Sig Dispense Refill  . acetaminophen (TYLENOL) 500 MG tablet Take 1,000 mg by mouth 2 (two) times daily as needed for moderate pain.    [name] Kitchen albuterol (VENTOLIN HFA) 108 (90 Base) MCG/ACT inhaler USE 2 PUFFS EVERY 6 HOURS  AS NEEDED FOR WHEEZING (Patient taking differently: Inhale 2 puffs into the lungs every 6 (six) hours as needed for wheezing or shortness of breath. ) 3 Inhaler 3  . budesonide-formoterol (SYMBICORT) 160-4.5 MCG/ACT inhaler USE 2 PUFFS BY MOUTH TWO  TIMES DAILY 30.6 g 2  . Calcium Carbonate-Vitamin D (CALCIUM 600+D PO) Take 2 tablets by mouth daily.    [name] Kitchen DALIRESP 500 MCG TABS tablet TAKE 1 TABLET BY MOUTH  DAILY 90 tablet 2  . fluticasone (FLONASE) 50 MCG/ACT nasal spray USE 2 SPRAYS IN EACH  NOSTRIL DAILY (Patient taking differently: Place 2 sprays into both nostrils daily. ) 48 g 1  . furosemide (LASIX) 20 MG tablet TAKE 1 TABLET (20 MG TOTAL) BY MOUTH DAILY AS NEEDED FOR FLUID OR EDEMA. 90 tablet 0  . Misc. Devices (PULSE OXIMETER FOR FINGER) MISC 1 Device by Does not apply route as needed. 1 each 0  . OXYGEN Inhale 3 L into the lungs See admin instructions.     . predniSONE (DELTASONE) 10 MG tablet Take 1 tablet (10 mg total) by mouth daily with breakfast. 90 tablet 3  . SPIRIVA RESPIMAT 2.5 MCG/ACT AERS USE 2 INHALATIONS BY MOUTH  ONCE DAILY 12 g 3  .  Tetrahydrozoline HCl (REDNESS RELIEVER EYE DROPS OP) Place 1 drop into both eyes daily as needed (redness / itching).    . traMADol (ULTRAM) 50 MG tablet Take 1 tablet (50 mg total) by mouth every 6 (six) hours as needed for moderate pain or severe pain. 30 tablet 1  . XARELTO 20 MG TABS tablet TAKE 1 TABLET BY MOUTH  DAILY WITH SUPPER  90 tablet 0   No current facility-administered medications for this visit.     ROS: See HPI for pertinent positives and negatives.   Physical Examination  Vitals:   03/26/19 0949  BP: 137/80  Pulse: (!) 117  Resp: 14  Temp: 97.8 F (36.6 C)  TempSrc: Temporal  SpO2: 98%  Weight: 161 lb (73 kg)  Height: 6' (1.829 m)   Body mass index is 21.84 kg/m.  General: A&O x 3, WDWN, frail appearing male accompanied by his wife. Gait: seated in w/c HEENT: No gross abnormalities.  Pulmonary: Respirations are labored at rest, use of supplemental 02/[place] at 3L/min, limited air movement in all fields (tight), few rales, no rhonchi or wheezes Cardiac: regular rhythm, distant heart sounds, no detected murmur.      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   Right toes, dark 3rd toe, early dry gangrene    Left toes     Dependent edema in feet (2+pitting) and lower legs (1+ pitting and non pitting). Several fluid filled vesicles in lower legs, more so in the left lower leg                                                                                                           LE Pulses Right Left       FEMORAL  not palpable, seated in w/c  not palpable        POPLITEAL  not palpable   not palpable       POSTERIOR TIBIAL  not palpable   not palpable        DORSALIS PEDIS      ANTERIOR TIBIAL not palpable  2+ palpable    Abdomen: soft, NT, no palpable masses. Skin:See  Extremities Musculoskeletal: no muscle wasting or atrophy.  Neurologic: A&O X 3; appropriate affect, Sensation is normal; MOTOR FUNCTION:  moving all extremities equally, motor strength 5/5 throughout. Speech is fluent/normal. CN 2-12 intact. Psychiatric: Thought content is normal, mood appropriate for clinical situation.     DATA  Right Aortoiliac Duplex (03-22-19: +-------------+-------+----------+----------+----------+--------+--------+ Location     AP (cm)Trans (cm)PSV (cm/s)Waveform  ThrombusComments +-------------+-------+----------+----------+----------+--------+--------+ RT EIA Prox  68.0                       monophasic                 +-------------+-------+----------+----------+----------+--------+--------+ RT EIA Mid   100.0                      monophasic                 +-------------+-------+----------+----------+----------+--------+--------+ RT EIA Distal95.0  monophasic                 +-------------+-------+----------+----------+----------+--------+--------+  Right Stent(s): +---------------+---++----------+-----+ Prox to Stent  82 monophasic      +---------------+---++----------+-----+ Proximal Stent 79 monophasic      +---------------+---++----------+-----+ Distal Stent   91 monophasic      +---------------+---++----------+-----+ Distal to Stent14monophasicbrisk +---------------+---++----------+-----+  Distal aorta 92 cm/s, Right CFA 32 cm/s monophasic waveform. Left CFA133 cm/s biphasic waveform. Summary: IVC/Iliac: The right common iliac artery stent appears patent, somewhat limited visualization due to breathing artifact, technically difficult exam.   ABI (Date: 03-22-19): +---------+------------------+-----+----------+--------+ Right    Rt Pressure (mmHg)IndexWaveform  Comment  +---------+------------------+-----+----------+--------+ Brachial 167                                        +---------+------------------+-----+----------+--------+ PTA      92                0.54 monophasic         +---------+------------------+-----+----------+--------+ DP       71                0.42 monophasic         +---------+------------------+-----+----------+--------+ Great Toe51                0.30 Abnormal           +---------+------------------+-----+----------+--------+  +---------+------------------+-----+---------+-------+ Left     Lt Pressure (mmHg)IndexWaveform Comment +---------+------------------+-----+---------+-------+ Brachial 171                                     +---------+------------------+-----+---------+-------+ PTA      166               0.97 triphasic        +---------+------------------+-----+---------+-------+ DP       156               0.91 biphasic         +---------+------------------+-----+---------+-------+ Great Toe134               0.78 Normal           +---------+------------------+-----+---------+-------+ +-------+-----------+-----------+------------+------------+ ABI/TBIToday's ABIToday's TBIPrevious ABIPrevious TBI +-------+-----------+-----------+------------+------------+ Right  0.54       0.30       0.74        -            +-------+-----------+-----------+------------+------------+ Left   0.97       0.78       1.03        0.84         +-------+-----------+-----------+------------+------------+  Summary: Right: Resting right ankle-brachial index indicates moderate right lower extremity arterial disease. The right toe-brachial index is abnormal.  Left: Resting left ankle-brachial index is within normal range. No evidence of significant left lower extremity arterial disease. The left toe-brachial index is normal.   ASSESSMENT: Francisco [name] is a 73 y.o. male who is s/p right common iliac artery stenting on 12-11-18 by [name].  This was done for a  nonhealing wound on his right first toe.  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  He had minimal disease on the left leg.  Plan was to see how his wounds healed after addressing his common iliac stenosis.  [name]  indicated in his follow up note on 12-31-18 that pt is not a very good operative candidate due to his underlying pulmonary dysfunction.  Consideration would be given for right femoral endarterectomy if his wounds failed to heal.    Pt reports right 3rd toe became dark about a month ago, is getting darker, has intermittent pain in his right 3rd toe.  His wife states that he has stage 4 COPD, he is using supplemental O2 via nasal canula, and is dyspneic at rest.   See Plan.  Dependent edema which improves by morning with overnight elevation of lower extremities: see Patient Instructions for elevation of lower extremities.    PLAN:  Based on the patient's vascular studies and examination, and after Dr. Donzetta Matters spoke with pt and wife, and evaluated pt, and he tried to get in contact with [name], pt will return to clinic next week, see 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    The patient was given information about PAD including signs, symptoms, treatment, what symptoms should prompt the patient to seek immediate medical care, and risk reduction measures to take.  Clemon Chambers, RN, MSN, FNP-C [facility] Phone: [phone]  Clinic MD: Donzetta Matters  03/26/19 9:59 AM

## 2019-03-26 NOTE — Patient Instructions (Signed)
  To decrease swelling in your feet and legs: Elevate feet above slightly bent knees, feet above heart, overnight and 3-4 times per day for 20 minutes.    Peripheral Vascular Disease  Peripheral vascular disease (PVD) is a disease of the blood vessels that are not part of your heart and brain. A simple term for PVD is poor circulation. In most cases, PVD narrows the blood vessels that carry blood from your heart to the rest of your body. This can reduce the supply of blood to your arms, legs, and internal organs, like your stomach or kidneys. However, PVD most often affects a person's lower legs and feet. Without treatment, PVD tends to get worse. PVD can also lead to acute ischemic limb. This is when an arm or leg suddenly cannot get enough blood. This is a medical emergency. Follow these instructions at home: Lifestyle  Do not use any products that contain nicotine or tobacco, such as cigarettes and e-cigarettes. If you need help quitting, ask your doctor.  Lose weight if you are overweight. Or, stay at a healthy weight as told by your doctor.  Eat a diet that is low in fat and cholesterol. If you need help, ask your doctor.  Exercise regularly. Ask your doctor for activities that are right for you. General instructions  Take over-the-counter and prescription medicines only as told by your doctor.  Take good care of your feet: ? Wear comfortable shoes that fit well. ? Check your feet often for any cuts or sores.  Keep all follow-up visits as told by your doctor This is important. Contact a doctor if:  You have cramps in your legs when you walk.  You have leg pain when you are at rest.  You have coldness in a leg or foot.  Your skin changes.  You are unable to get or have an erection (erectile dysfunction).  You have cuts or sores on your feet that do not heal. Get help right away if:  Your arm or leg turns cold, numb, and blue.  Your arms or legs become red, warm,  swollen, painful, or numb.  You have chest pain.  You have trouble breathing.  You suddenly have weakness in your face, arm, or leg.  You become very confused or you cannot speak.  You suddenly have a very bad headache.  You suddenly cannot see. Summary  Peripheral vascular disease (PVD) is a disease of the blood vessels.  A simple term for PVD is poor circulation. Without treatment, PVD tends to get worse.  Treatment may include exercise, low fat and low cholesterol diet, and quitting smoking. This information is not intended to replace advice given to you by your health care provider. Make sure you discuss any questions you have with your health care provider. Document Released: 09/18/2009 Document Revised: 06/06/2017 Document Reviewed: 08/01/2016 Elsevier Patient Education  2020 [employer].

## 2019-03-29 ENCOUNTER — Ambulatory Visit: Payer: Medicare Other | Admitting: Family

## 2019-04-01 ENCOUNTER — Other Ambulatory Visit: Payer: Self-pay

## 2019-04-01 ENCOUNTER — Telehealth: Payer: Self-pay

## 2019-04-01 ENCOUNTER — Ambulatory Visit (INDEPENDENT_AMBULATORY_CARE_PROVIDER_SITE_OTHER): Payer: Medicare Other | Admitting: Vascular Surgery

## 2019-04-01 ENCOUNTER — Encounter: Payer: Self-pay | Admitting: Family

## 2019-04-01 VITALS — BP 141/88 | HR 106 | Temp 96.9°F | Resp 18 | Ht 72.0 in | Wt 163.0 lb

## 2019-04-01 DIAGNOSIS — I739 Peripheral vascular disease, unspecified: Secondary | ICD-10-CM

## 2019-04-01 DIAGNOSIS — I96 Gangrene, not elsewhere classified: Secondary | ICD-10-CM

## 2019-04-01 NOTE — Telephone Encounter (Signed)
Pt called to say that after he got home he decided he wanted to go ahead and proceed with surgery. Note faxed to his pulmonologist for clearance. Then we will proceed with booking.   York Cerise, CMA

## 2019-04-01 NOTE — H&P (View-Only) (Signed)
Patient is a 73 year old male who returns for follow-up today.  He previously underwent right common iliac stenting December 11, 2018.  This was done for a nonhealing wound of the right foot.  Initially the foot healed.  Now he has had deterioration of the right third toe.  He also has some early symptoms of rest pain at nighttime.  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  Recent duplex scan showed that the right common iliac stent is patent.  ABI on the right side was 0.5.  ABI on the left was 0.97.  I reviewed the studies from March 22, 2019.  I also reviewed his arteriogram which showed subtotal occlusion of the mid to distal right common femoral artery with three-vessel runoff to the right foot.  He had inline flow to the left leg.  The patient has fairly significant pulmonary dysfunction.  He is on continuous home oxygen.  He has a dusky blue appearance to both feet chronically from hypoxia rather than arterial occlusive disease.  However, the ulcer on his right foot which has failed to heal certainly is related to his peripheral arterial disease in his right leg.  His pulmonologist is [name].  He states that today he has fairly tight chest symptoms from his COPD.  He thinks he needs a tune up of his COPD.  He is on Xarelto for prior pulmonary embolus.  Review of systems: He has shortness of breath at rest.  He has no chest pain.  Past Medical History:  Diagnosis Date  . Chest x-ray abnormality   . Chronic back pain    "mid and lower" (04/07/2018)  . Chronic rhinitis    -Sinus Ct 08/01/2009 >> Bilateral maxillary sinusitis with some mucosal thickeningin the sphenoid and frontal sinuses as well with air fluid levels present -chronic rhinitis flyer Aug 04, 2009  . Compressed spine fracture ([place])   . COPD (chronic obstructive pulmonary disease) [facility])    PFT's rec Jul 17, 2009  . Emphysema lung ([place])   . On home oxygen therapy    "3L; 24/7"  (04/07/2018)  . Onychomycosis    Dr. Judi Cong  . Orthostatic hypotension    "since 10/2017" (04/07/2018)  . PAD (peripheral artery disease) ([place])   . Pneumonia    "twice in 1 year" (04/07/2018)  . Pulmonary embolism ([place]) 04/07/2018  . Skin cancer    "lips, face, ears, arms" (04/07/2018)  . Vertigo    "since ~ 02/2018" (04/07/2018)     Current Outpatient Medications on File Prior to Visit  Medication Sig Dispense Refill  . acetaminophen (TYLENOL) 500 MG tablet Take 1,000 mg by mouth 2 (two) times daily as needed for moderate pain.    [name] Kitchen albuterol (VENTOLIN HFA) 108 (90 Base) MCG/ACT inhaler USE 2 PUFFS EVERY 6 HOURS  AS NEEDED FOR WHEEZING (Patient taking differently: Inhale 2 puffs into the lungs every 6 (six) hours as needed for wheezing or shortness of breath. ) 3 Inhaler 3  . budesonide-formoterol (SYMBICORT) 160-4.5 MCG/ACT inhaler USE 2 PUFFS BY MOUTH TWO  TIMES DAILY 30.6 g 2  . Calcium Carbonate-Vitamin D (CALCIUM 600+D PO) Take 2 tablets by mouth daily.    [name] Kitchen DALIRESP 500 MCG TABS tablet TAKE 1 TABLET BY MOUTH  DAILY 90 tablet 2  . fluticasone (FLONASE) 50 MCG/ACT nasal spray USE 2 SPRAYS IN EACH  NOSTRIL DAILY (Patient taking differently: Place 2 sprays into both nostrils daily. ) 48  g 1  . furosemide (LASIX) 20 MG tablet TAKE 1 TABLET (20 MG TOTAL) BY MOUTH DAILY AS NEEDED FOR FLUID OR EDEMA. 90 tablet 0  . Misc. Devices (PULSE OXIMETER FOR FINGER) MISC 1 Device by Does not apply route as needed. 1 each 0  . OXYGEN Inhale 3 L into the lungs See admin instructions.     . predniSONE (DELTASONE) 10 MG tablet Take 1 tablet (10 mg total) by mouth daily with breakfast. 90 tablet 3  . SPIRIVA RESPIMAT 2.5 MCG/ACT AERS USE 2 INHALATIONS BY MOUTH  ONCE DAILY 12 g 3  . Tetrahydrozoline HCl (REDNESS RELIEVER EYE DROPS OP) Place 1 drop into both eyes daily as needed (redness / itching).    . traMADol (ULTRAM) 50 MG tablet Take 1 tablet (50 mg total) by mouth every 6 (six) hours as needed for  moderate pain or severe pain. 30 tablet 1  . XARELTO 20 MG TABS tablet TAKE 1 TABLET BY MOUTH  DAILY WITH SUPPER 90 tablet 0   No current facility-administered medications on file prior to visit.     Allergies  Allergen Reactions  . Tape Other (See Comments)    SKIN IS VERY THIN; CAN ONLY USE COBAN WRAPS DUE TO CONDITION OF SKIN!!  . Levaquin [Levofloxacin]     hallucinations    Physical exam:  Vitals:   04/01/19 0839  BP: (!) 141/88  Pulse: (!) 106  Resp: 18  Temp: (!) 96.9 F (36.1 C)  TempSrc: Temporal  SpO2: 97%  Weight: 163 lb (73.9 kg)  Height: 6' (1.829 m)    Extremities:    Ulceration right third toe.  Baseline duskiness of the right foot.  This is symmetrical to his left foot.  I do not believe the duskiness is related to his arterial disease.  It is more related to his pulmonary dysfunction.  Assessment: Nonhealing wound right third toe despite right common iliac stenting.  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  I believe the only way we will get his right foot to completely heal with we a right common femoral endarterectomy.  I did discuss with the patient today the possibility of using local anesthesia.  He also discussed whether or not he might be able to get a spinal for this.  He obviously would be high risk for general anesthesia.  However, I told him that sometimes this potentially would have to be converted to general anesthesia and he would certainly be high pulmonary risk if we had to proceed with this.  He understands all these possibilities related to his lung function.  I quoted him a 10% risk of pneumonia with regional or local anesthesia.  This probably is a size 25% with general anesthesia.  I also discussed with him minor risks of wound infection 1 to 2%.  Myocardial events 5%.  Persistent risk of limb loss.  He understands all of the procedure details and the risks involved.  He wishes to think about whether or not to  proceed with a procedure at this point.  Plan: Right common femoral endarterectomy with regional and local anesthesia if possible.  If the patient decides to proceed with right common femoral endarterectomy.  He would need a pulmonary tune up by [name] prior to this.  Ruta Hinds, MD [facility] [place] Office: [phone] Pager: [phone]

## 2019-04-01 NOTE — Progress Notes (Signed)
Patient is a 73 year old male who returns for follow-up today.  He previously underwent right common iliac stenting December 11, 2018.  This was done for a nonhealing wound of the right foot.  Initially the foot healed.  Now he has had deterioration of the right third toe.  He also has some early symptoms of rest pain at nighttime.  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  Recent duplex scan showed that the right common iliac stent is patent.  ABI on the right side was 0.5.  ABI on the left was 0.97.  I reviewed the studies from March 22, 2019.  I also reviewed his arteriogram which showed subtotal occlusion of the mid to distal right common femoral artery with three-vessel runoff to the right foot.  He had inline flow to the left leg.  The patient has fairly significant pulmonary dysfunction.  He is on continuous home oxygen.  He has a dusky blue appearance to both feet chronically from hypoxia rather than arterial occlusive disease.  However, the ulcer on his right foot which has failed to heal certainly is related to his peripheral arterial disease in his right leg.  His pulmonologist is [name].  He states that today he has fairly tight chest symptoms from his COPD.  He thinks he needs a tune up of his COPD.  He is on Xarelto for prior pulmonary embolus.  Review of systems: He has shortness of breath at rest.  He has no chest pain.  Past Medical History:  Diagnosis Date  . Chest x-ray abnormality   . Chronic back pain    "mid and lower" (04/07/2018)  . Chronic rhinitis    -Sinus Ct 08/01/2009 >> Bilateral maxillary sinusitis with some mucosal thickeningin the sphenoid and frontal sinuses as well with air fluid levels present -chronic rhinitis flyer Aug 04, 2009  . Compressed spine fracture [place]   . COPD (chronic obstructive pulmonary disease) [facility])    PFT's rec Jul 17, 2009  . Emphysema lung ([place])   . On home oxygen therapy    "3L; 24/7"  (04/07/2018)  . Onychomycosis    Dr. Judi Cong  . Orthostatic hypotension    "since 10/2017" (04/07/2018)  . PAD (peripheral artery disease) ([place])   . Pneumonia    "twice in 1 year" (04/07/2018)  . Pulmonary embolism ([place]) 04/07/2018  . Skin cancer    "lips, face, ears, arms" (04/07/2018)  . Vertigo    "since ~ 02/2018" (04/07/2018)     Current Outpatient Medications on File Prior to Visit  Medication Sig Dispense Refill  . acetaminophen (TYLENOL) 500 MG tablet Take 1,000 mg by mouth 2 (two) times daily as needed for moderate pain.    [name] Kitchen albuterol (VENTOLIN HFA) 108 (90 Base) MCG/ACT inhaler USE 2 PUFFS EVERY 6 HOURS  AS NEEDED FOR WHEEZING (Patient taking differently: Inhale 2 puffs into the lungs every 6 (six) hours as needed for wheezing or shortness of breath. ) 3 Inhaler 3  . budesonide-formoterol (SYMBICORT) 160-4.5 MCG/ACT inhaler USE 2 PUFFS BY MOUTH TWO  TIMES DAILY 30.6 g 2  . Calcium Carbonate-Vitamin D (CALCIUM 600+D PO) Take 2 tablets by mouth daily.    [name] Kitchen DALIRESP 500 MCG TABS tablet TAKE 1 TABLET BY MOUTH  DAILY 90 tablet 2  . fluticasone (FLONASE) 50 MCG/ACT nasal spray USE 2 SPRAYS IN EACH  NOSTRIL DAILY (Patient taking differently: Place 2 sprays into both nostrils daily. ) 48  g 1  . furosemide (LASIX) 20 MG tablet TAKE 1 TABLET (20 MG TOTAL) BY MOUTH DAILY AS NEEDED FOR FLUID OR EDEMA. 90 tablet 0  . Misc. Devices (PULSE OXIMETER FOR FINGER) MISC 1 Device by Does not apply route as needed. 1 each 0  . OXYGEN Inhale 3 L into the lungs See admin instructions.     . predniSONE (DELTASONE) 10 MG tablet Take 1 tablet (10 mg total) by mouth daily with breakfast. 90 tablet 3  . SPIRIVA RESPIMAT 2.5 MCG/ACT AERS USE 2 INHALATIONS BY MOUTH  ONCE DAILY 12 g 3  . Tetrahydrozoline HCl (REDNESS RELIEVER EYE DROPS OP) Place 1 drop into both eyes daily as needed (redness / itching).    . traMADol (ULTRAM) 50 MG tablet Take 1 tablet (50 mg total) by mouth every 6 (six) hours as needed for  moderate pain or severe pain. 30 tablet 1  . XARELTO 20 MG TABS tablet TAKE 1 TABLET BY MOUTH  DAILY WITH SUPPER 90 tablet 0   No current facility-administered medications on file prior to visit.     Allergies  Allergen Reactions  . Tape Other (See Comments)    SKIN IS VERY THIN; CAN ONLY USE COBAN WRAPS DUE TO CONDITION OF SKIN!!  . Levaquin [Levofloxacin]     hallucinations    Physical exam:  Vitals:   04/01/19 0839  BP: (!) 141/88  Pulse: (!) 106  Resp: 18  Temp: (!) 96.9 F (36.1 C)  TempSrc: Temporal  SpO2: 97%  Weight: 163 lb (73.9 kg)  Height: 6' (1.829 m)    Extremities:    Ulceration right third toe.  Baseline duskiness of the right foot.  This is symmetrical to his left foot.  I do not believe the duskiness is related to his arterial disease.  It is more related to his pulmonary dysfunction.  Assessment: Nonhealing wound right third toe despite right common iliac stenting.  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  I believe the only way we will get his right foot to completely heal with we a right common femoral endarterectomy.  I did discuss with the patient today the possibility of using local anesthesia.  He also discussed whether or not he might be able to get a spinal for this.  He obviously would be high risk for general anesthesia.  However, I told him that sometimes this potentially would have to be converted to general anesthesia and he would certainly be high pulmonary risk if we had to proceed with this.  He understands all these possibilities related to his lung function.  I quoted him a 10% risk of pneumonia with regional or local anesthesia.  This probably is a size 25% with general anesthesia.  I also discussed with him minor risks of wound infection 1 to 2%.  Myocardial events 5%.  Persistent risk of limb loss.  He understands all of the procedure details and the risks involved.  He wishes to think about whether or not to  proceed with a procedure at this point.  Plan: Right common femoral endarterectomy with regional and local anesthesia if possible.  If the patient decides to proceed with right common femoral endarterectomy.  He would need a pulmonary tune up by [name] prior to this.  Ruta Hinds, MD [facility] Office: [phone] Pager: [phone]

## 2019-04-01 NOTE — Patient Instructions (Signed)
Peripheral Vascular Disease  Peripheral vascular disease (PVD) is a disease of the blood vessels that are not part of your heart and brain. A simple term for PVD is poor circulation. In most cases, PVD narrows the blood vessels that carry blood from your heart to the rest of your body. This can reduce the supply of blood to your arms, legs, and internal organs, like your stomach or kidneys. However, PVD most often affects a person's lower legs and feet. Without treatment, PVD tends to get worse. PVD can also lead to acute ischemic limb. This is when an arm or leg suddenly cannot get enough blood. This is a medical emergency. Follow these instructions at home: Lifestyle  Do not use any products that contain nicotine or tobacco, such as cigarettes and e-cigarettes. If you need help quitting, ask your doctor.  Lose weight if you are overweight. Or, stay at a healthy weight as told by your doctor.  Eat a diet that is low in fat and cholesterol. If you need help, ask your doctor.  Exercise regularly. Ask your doctor for activities that are right for you. General instructions  Take over-the-counter and prescription medicines only as told by your doctor.  Take good care of your feet: ? Wear comfortable shoes that fit well. ? Check your feet often for any cuts or sores.  Keep all follow-up visits as told by your doctor This is important. Contact a doctor if:  You have cramps in your legs when you walk.  You have leg pain when you are at rest.  You have coldness in a leg or foot.  Your skin changes.  You are unable to get or have an erection (erectile dysfunction).  You have cuts or sores on your feet that do not heal. Get help right away if:  Your arm or leg turns cold, numb, and blue.  Your arms or legs become red, warm, swollen, painful, or numb.  You have chest pain.  You have trouble breathing.  You suddenly have weakness in your face, arm, or leg.  You become very  confused or you cannot speak.  You suddenly have a very bad headache.  You suddenly cannot see. Summary  Peripheral vascular disease (PVD) is a disease of the blood vessels.  A simple term for PVD is poor circulation. Without treatment, PVD tends to get worse.  Treatment may include exercise, low fat and low cholesterol diet, and quitting smoking. This information is not intended to replace advice given to you by your health care provider. Make sure you discuss any questions you have with your health care provider. Document Released: 09/18/2009 Document Revised: 06/06/2017 Document Reviewed: 08/01/2016 Elsevier Patient Education  2020 Elsevier Inc.  

## 2019-04-08 ENCOUNTER — Ambulatory Visit (INDEPENDENT_AMBULATORY_CARE_PROVIDER_SITE_OTHER): Payer: Medicare Other | Admitting: Pulmonary Disease

## 2019-04-08 ENCOUNTER — Other Ambulatory Visit: Payer: Self-pay

## 2019-04-08 ENCOUNTER — Encounter: Payer: Self-pay | Admitting: Pulmonary Disease

## 2019-04-08 DIAGNOSIS — J449 Chronic obstructive pulmonary disease, unspecified: Secondary | ICD-10-CM | POA: Diagnosis not present

## 2019-04-08 DIAGNOSIS — Z7189 Other specified counseling: Secondary | ICD-10-CM

## 2019-04-08 DIAGNOSIS — J441 Chronic obstructive pulmonary disease with (acute) exacerbation: Secondary | ICD-10-CM

## 2019-04-08 DIAGNOSIS — J9611 Chronic respiratory failure with hypoxia: Secondary | ICD-10-CM | POA: Diagnosis not present

## 2019-04-08 MED ORDER — PREDNISONE 10 MG PO TABS: ORAL_TABLET | ORAL | 0 refills | Status: DC

## 2019-04-08 MED ORDER — AMOXICILLIN-POT CLAVULANATE 875-125 MG PO TABS: 1.0000 | ORAL_TABLET | Freq: Two times a day (BID) | ORAL | 0 refills | Status: DC

## 2019-04-08 NOTE — Assessment & Plan Note (Signed)
Plan: Continue oxygen therapy as prescribed 

## 2019-04-08 NOTE — Progress Notes (Signed)
Virtual Visit via Telephone Note  I connected with Lesia Sago II on 04/08/19 at 11:00 AM EDT by telephone and verified that I am speaking with the correct person using two identifiers.  Location: Patient: Home Provider: [facility] - [address], [place], [place], [place] [address]   I discussed the limitations, risks, security and privacy concerns of performing an evaluation and management service by telephone and the availability of in person appointments. I also discussed with the patient that there may be a patient responsible charge related to this service. The patient expressed understanding and agreed to proceed.  Patient consented to consult via telephone: Yes People present and their role in pt care: Pt     History of Present Illness:  73 year old male former smoker followed in our office for COPD  PMH: Hypertension, osteoporosis, chronic PE Smoker/ Smoking History: Former smoker.  Quit smoking in 2019.  104-pack-year smoking history. Maintenance: Symbicort 160, Spiriva Respimat 2.5, 10 mg prednisone daily, daliresp Pt of: [name]  Chief complaint: COPD flare suspected    73 year old male former smoker followed in our office for very severe COPD.  Patient has a history of recurrent exacerbations.  He is managed on Symbicort 160, Spiriva Respimat 2.5, 10 mg of prednisone daily, Daliresp 500 was previously managed on azithromycin Monday Wednesday Friday.  Patient was most recently treated for a COPD exacerbation in August/2020 by [name] with doxycycline and prednisone.  He reports that he did get better but then had another exacerbation shortly after which is what he is calling today.  Patient also continues to follow with vascular vein who would like to get the patient scheduled for a right common femoral endarterectomy with [name].  They would like for the patient to be at optimal pulmonary performance prior to surgery.  Patient reports  that his symptoms today started 2 weeks ago with increased cough and congestion.  He is had increased wheezing as well as coughing up discolored mucus.  He denies other sick contacts.  He denies any fevers or chills.  Patient is following COVID-19 home isolation guidelines for his safety. VVS scheduled for surgery    Observations/Objective:  04/07/2018-CTA- nonocclusive PE centrally involving branches of the right middle lobe and right lower lobe, no evidence of right heart strain  11/04/2017-echocardiogram-LV ejection fraction of 65 to 70%, grade 1 diastolic dysfunction  3/50/0938-[id number] function test- FVC 4.59 (92% predicted), postbronchodilator ratio 29, postbronchodilator FEV1 1.41 (40% predicted), DLCO 72  11/27/2018-chest x-ray- no active cardiopulmonary disease, COPD  Assessment and Plan:  Chronic respiratory failure with hypoxia ([place]) Plan: Continue oxygen therapy as prescribed  COPD with acute exacerbation (HCC) Plan: Augmentin today Prednisone taper today Once finished with prednisone taper resume daily prednisone Continue Symbicort 160, Spiriva Respimat 2.5, Daliresp  I will discuss with [name] whether or not we can consider restarting you on Monday Wednesday Friday azithromycin pending EKG at next office visit  Stage 4 very severe COPD by GOLD classification ([place]) Plan: Continue Symbicort 160 Continue Daliresp Continue Spiriva Respimat 2.5 Continue 10 mg of prednisone after finishing prednisone taper prescribed today Close follow-up with our office in 1 week  Surgical counseling visit Patient would like to complete a right common femoral endarterectomy with [name] with vascular vein.  He will need an office visit towards the end of his treatment therapy to see if he is coming out of his exacerbation is stable to proceed forward with surgery.   Follow Up  Instructions:  Return in about 10 days (around 04/18/2019), or if symptoms worsen or fail to  improve, for Follow up with Dr. Purnell Shoemaker, Follow up with [name] FNP-C.   I discussed the assessment and treatment plan with the patient. The patient was provided an opportunity to ask questions and all were answered. The patient agreed with the plan and demonstrated an understanding of the instructions.   The patient was advised to call back or seek an in-person evaluation if the symptoms worsen or if the condition fails to improve as anticipated.  I provided 24 minutes of non-face-to-face time during this encounter.   Lauraine Rinne, NP

## 2019-04-08 NOTE — Assessment & Plan Note (Signed)
Plan: Augmentin today Prednisone taper today Once finished with prednisone taper resume daily prednisone Continue Symbicort 160, Spiriva Respimat 2.5, Daliresp  I will discuss with [name] whether or not we can consider restarting you on Monday Wednesday Friday azithromycin pending EKG at next office visit

## 2019-04-08 NOTE — Patient Instructions (Addendum)
You were seen today by Lauraine Rinne, NP  for:   1. Chronic respiratory failure with hypoxia (HCC)  Continue oxygen therapy as prescribed  >>>maintain oxygen saturations greater than 88 percent  >>>if unable to maintain oxygen saturations please contact the office  >>>do not smoke with oxygen  >>>can use nasal saline gel or nasal saline rinses to moisturize nose if oxygen causes dryness  2. Stage 4 very severe COPD by GOLD classification ([place])  - predniSONE (DELTASONE) 10 MG tablet; 4 tabs for 2 days, then 3 tabs for 2 days, 2 tabs for 2 days, then 1 tab for 2 days, then stop  Dispense: 20 tablet; Refill: 0 - amoxicillin-clavulanate (AUGMENTIN) 875-125 MG tablet; Take 1 tablet by mouth 2 (two) times daily.  Dispense: 14 tablet; Refill: 0  Continue Symbicort 160 >>> 2 puffs in the morning right when you wake up, rinse out your mouth after use, 12 hours later 2 puffs, rinse after use >>> Take this daily, no matter what >>> This is not a rescue inhaler   Spiriva Respimat 2.5 >>> 2 puffs daily >>> Do this every day >>>This is not a rescue inhaler   Continue Daliresp  I will speak with [name] regarding the Monday Wednesday Friday azithromycin, we may need to consider restarting this as well as do an EKG at your next office visit  Note your daily symptoms > remember "red flags" for COPD:   >>>Increase in cough >>>increase in sputum production >>>increase in shortness of breath or activity  intolerance.   If you notice these symptoms, please call the office to be seen.    3. Surgical counseling visit  We will coordinate seeing you in office in about 10 days so that we we can clear you for vein surgery  Please contact your vascular vein and ask for them to refax this paperwork he can place it as attention to [name], [place] [facility]  4. COPD with acute exacerbation ([place])  Please take the prednisone taper as outlined, when it comes down to taking 10 mg of prednisone daily you can  resume your daily prednisone dose  Please proceed forward with taking the Augmentin   We recommend today:  No orders of the defined types were placed in this encounter.  No orders of the defined types were placed in this encounter.  Meds ordered this encounter  Medications  . predniSONE (DELTASONE) 10 MG tablet    Sig: 4 tabs for 2 days, then 3 tabs for 2 days, 2 tabs for 2 days, then 1 tab for 2 days, then stop    Dispense:  20 tablet    Refill:  0  . amoxicillin-clavulanate (AUGMENTIN) 875-125 MG tablet    Sig: Take 1 tablet by mouth 2 (two) times daily.    Dispense:  14 tablet    Refill:  0    Follow Up:    Return in about 10 days (around 04/18/2019), or if symptoms worsen or fail to improve, for Follow up with Dr. Purnell Shoemaker, Follow up with [name] FNP-C.   Please do your part to reduce the spread of COVID-19:      Reduce your risk of any infection  and COVID19 by using the similar precautions used for avoiding the common cold or flu:  [name] Kitchen Wash your hands often with soap and warm water for at least 20 seconds.  If soap and water are not readily available, use an alcohol-based hand sanitizer with at least 60% alcohol.  [name] Kitchen  If coughing or sneezing, cover your mouth and nose by coughing or sneezing into the elbow areas of your shirt or coat, into a tissue or into your sleeve (not your hands). Langley Gauss A MASK when in public  . Avoid shaking hands with others and consider head nods or verbal greetings only. . Avoid touching your eyes, nose, or mouth with unwashed hands.  . Avoid close contact with people who are sick. . Avoid places or events with large numbers of people in one location, like concerts or sporting events. . If you have some symptoms but not all symptoms, continue to monitor at home and seek medical attention if your symptoms worsen. . If you are having a medical emergency, call 911.   [place] /  e-Visit: [email or web address]         [facility]: [facility]: [phone]                   [facility]: [phone]     It is flu season:   >>> Best ways to protect herself from the flu: Receive the yearly flu vaccine, practice good hand hygiene washing with soap and also using hand sanitizer when available, eat a nutritious meals, get adequate rest, hydrate appropriately   Please contact the office if your symptoms worsen or you have concerns that you are not improving.   Thank you for choosing [facility] for your healthcare, and for allowing [place] to partner with you on your healthcare journey. I am thankful to be able to provide care to you today.   [name] FNP-C

## 2019-04-08 NOTE — Assessment & Plan Note (Signed)
Patient would like to complete a right common femoral endarterectomy with [name] with vascular vein.  He will need an office visit towards the end of his treatment therapy to see if he is coming out of his exacerbation is stable to proceed forward with surgery.

## 2019-04-08 NOTE — Assessment & Plan Note (Signed)
Plan: Continue Symbicort 160 Continue Daliresp Continue Spiriva Respimat 2.5 Continue 10 mg of prednisone after finishing prednisone taper prescribed today Close follow-up with our office in 1 week

## 2019-04-09 ENCOUNTER — Encounter: Payer: Self-pay | Admitting: *Deleted

## 2019-04-09 ENCOUNTER — Telehealth: Payer: Self-pay | Admitting: Internal Medicine

## 2019-04-09 ENCOUNTER — Other Ambulatory Visit: Payer: Self-pay | Admitting: *Deleted

## 2019-04-09 NOTE — Telephone Encounter (Signed)
Called and spoke with Francisco [name], [facility].  Francisco [name] stated [name] in going to do foot surgery on Patient 04/19/19.  Patient is scheduled with [name] 04/21/19.  Called and spoke with Patient.  OV with MR 04/21/19 cancelled.  Advised Patient to call to reschedule OV once he is home.   Patient wanted to make sure MR knew he was having surgery and would be at [facility]. Explained I would send MR a message to update him on Patient.  Message routed to [name] as Juluis Rainier

## 2019-04-11 ENCOUNTER — Other Ambulatory Visit: Payer: Self-pay | Admitting: Family Medicine

## 2019-04-12 NOTE — Telephone Encounter (Signed)
I woiuld recommend he see an APP for preop eval this week before foot surgery

## 2019-04-12 NOTE — Telephone Encounter (Signed)
Spoke with pt. He has been scheduled to see [name] (asked for him by name) on 04/16/2019 at 0930. Nothing further was needed at this time.

## 2019-04-14 NOTE — Progress Notes (Signed)
[id number] [place], [place] [address] [place] [address] Phone: [phone] Fax: [phone]  [place] [facility] [address] [place] [address] [place] [address] Phone: [phone] Fax: [phone]  [facility] [place], [place] - [address] [place] [address] Phone: [phone] Fax: [phone]      Your procedure is scheduled on Monday 04/19/2019.  Report to [facility] Main Entrance "A" at 11:00 A.M., and check in at the Admitting office.  Call this number if you have problems the morning of surgery:  [phone]  Call [phone] if you have any questions prior to your surgery date Monday-Friday 8am-4pm    Remember:  Do not eat or drink after midnight the night before your surgery     Take these medicines the morning of surgery with A SIP OF WATER: Acetaminophen (Tylenol) Albuterol (Ventolin) inhaler - if needed Budesonide-formoterol (Symbicort) Daliresp Fluticasone (Flonase) Spiriva Respimat Tetrahydrozoline HCl eye drops - if needed Tramadol (Ultram) - if needed   7 days prior to surgery STOP taking any Aspirin (unless otherwise instructed by your surgeon), Aleve, Naproxen, Ibuprofen, Motrin, Advil, Goody's, BC's, all herbal medications, fish oil, and all vitamins.    The Morning of Surgery  Do not wear jewelry.  Do not wear lotions, powders, colognes, or deodorant  Men may shave face and neck.  Do not bring valuables to the hospital.  [facility] is not responsible for any belongings or valuables.  If you are a smoker, DO NOT Smoke 24 hours prior to surgery  If you wear a CPAP at night please bring your mask, tubing, and machine the morning of surgery   Remember that you must have someone to transport you home after your surgery, and remain with you for 24 hours if you are  discharged the same day.   Contacts, eyeglasses, hearing aids, dentures or bridgework may not be worn into surgery.   Leave your suitcase in the car.  After surgery it may be brought to your room.  For patients admitted to the hospital, discharge time will be determined by your treatment team.  Patients discharged the day of surgery will not be allowed to drive home.    Special instructions:   [place]- Preparing For Surgery  Before surgery, you can play an important role. Because skin is not sterile, your skin needs to be as free of germs as possible. You can reduce the number of germs on your skin by washing with CHG (chlorahexidine gluconate) Soap before surgery.  CHG is an antiseptic cleaner which kills germs and bonds with the skin to continue killing germs even after washing.    Oral Hygiene is also important to reduce your risk of infection.  Remember - BRUSH YOUR TEETH THE MORNING OF SURGERY WITH YOUR REGULAR TOOTHPASTE  Please do not use if you have an allergy to CHG or antibacterial soaps. If your skin becomes reddened/irritated stop using the CHG.  Do not shave (including legs and underarms) for at least 48 hours prior to first CHG shower. It is OK to shave your face.  Please follow these instructions carefully.   1. Shower the NIGHT BEFORE SURGERY and the MORNING OF SURGERY with CHG Soap.   2. If you chose to wash your hair, wash your hair first as usual with your normal shampoo.  3.  After you shampoo, rinse your hair and body thoroughly to remove the shampoo.  4. Use CHG as you would any other liquid soap. You can apply CHG directly to the skin and wash gently with a scrungie or a clean washcloth.   5. Apply the CHG Soap to your body ONLY FROM THE NECK DOWN.  Do not use on open wounds or open sores. Avoid contact with your eyes, ears, mouth and genitals (private parts). Wash Face and genitals (private parts)  with your normal soap.   6. Wash thoroughly, paying  special attention to the area where your surgery will be performed.  7. Thoroughly rinse your body with warm water from the neck down.  8. DO NOT shower/wash with your normal soap after using and rinsing off the CHG Soap.  9. Pat yourself dry with a CLEAN TOWEL.  10. Wear CLEAN PAJAMAS to bed the night before surgery, wear comfortable clothes the morning of surgery  11. Place CLEAN SHEETS on your bed the night of your first shower and DO NOT SLEEP WITH PETS.    Day of Surgery:  Please shower the morning of surgery with the CHG soap  Do not apply any deodorants/lotions. Please wear clean clothes to the hospital/surgery center.   Remember to brush your teeth WITH YOUR REGULAR TOOTHPASTE.   Please read over the following fact sheets that you were given.

## 2019-04-15 ENCOUNTER — Encounter (HOSPITAL_COMMUNITY): Payer: Self-pay | Admitting: *Deleted

## 2019-04-15 ENCOUNTER — Other Ambulatory Visit: Payer: Self-pay

## 2019-04-15 ENCOUNTER — Encounter (HOSPITAL_COMMUNITY)
Admission: RE | Admit: 2019-04-15 | Discharge: 2019-04-15 | Disposition: A | Discharge status: Home / Self Care | Payer: Medicare Other | Source: Ambulatory Visit | Attending: Vascular Surgery | Admitting: Vascular Surgery

## 2019-04-15 ENCOUNTER — Other Ambulatory Visit (HOSPITAL_COMMUNITY)
Admission: RE | Admit: 2019-04-15 | Discharge: 2019-04-15 | Disposition: A | Discharge status: Home / Self Care | Payer: Medicare Other | Source: Ambulatory Visit | Attending: Vascular Surgery | Admitting: Vascular Surgery

## 2019-04-15 ENCOUNTER — Ambulatory Visit (HOSPITAL_COMMUNITY)
Admission: RE | Admit: 2019-04-15 | Discharge: 2019-04-15 | Disposition: A | Discharge status: Home / Self Care | Payer: Medicare Other | Source: Ambulatory Visit | Attending: Anesthesiology | Admitting: Anesthesiology

## 2019-04-15 DIAGNOSIS — Z01818 Encounter for other preprocedural examination: Secondary | ICD-10-CM | POA: Diagnosis not present

## 2019-04-15 DIAGNOSIS — J449 Chronic obstructive pulmonary disease, unspecified: Secondary | ICD-10-CM | POA: Insufficient documentation

## 2019-04-15 DIAGNOSIS — Z20828 Contact with and (suspected) exposure to other viral communicable diseases: Secondary | ICD-10-CM | POA: Insufficient documentation

## 2019-04-15 LAB — URINALYSIS, ROUTINE W REFLEX MICROSCOPIC
Bacteria, UA: NONE SEEN
Bilirubin Urine: NEGATIVE
Glucose, UA: NEGATIVE mg/dL
Ketones, ur: NEGATIVE mg/dL
Leukocytes,Ua: NEGATIVE
Nitrite: NEGATIVE
Protein, ur: NEGATIVE mg/dL
Specific Gravity, Urine: 1.012 (ref 1.005–1.030)
pH: 7 (ref 5.0–8.0)

## 2019-04-15 LAB — ABO/RH: ABO/RH(D): A POS

## 2019-04-15 LAB — COMPREHENSIVE METABOLIC PANEL
ALT: 44 U/L (ref 0–44)
AST: 44 U/L — ABNORMAL HIGH (ref 15–41)
Albumin: 3.7 g/dL (ref 3.5–5.0)
Alkaline Phosphatase: 63 U/L (ref 38–126)
Anion gap: 14 (ref 5–15)
BUN: 17 mg/dL (ref 8–23)
CO2: 22 mmol/L (ref 22–32)
Calcium: 10 mg/dL (ref 8.9–10.3)
Chloride: 99 mmol/L (ref 98–111)
Creatinine, Ser: 1.05 mg/dL (ref 0.61–1.24)
GFR calc Af Amer: >60 mL/min (ref >60)
GFR calc non Af Amer: >60 mL/min (ref >60)
Glucose, Bld: 109 mg/dL — ABNORMAL HIGH (ref 70–99)
Potassium: 4.2 mmol/L (ref 3.5–5.1)
Sodium: 135 mmol/L (ref 135–145)
Total Bilirubin: 0.7 mg/dL (ref 0.3–1.2)
Total Protein: 6.6 g/dL (ref 6.5–8.1)

## 2019-04-15 LAB — BLOOD GAS, ARTERIAL
Acid-Base Excess: 2.3 mmol/L — ABNORMAL HIGH (ref 0.0–2.0)
Bicarbonate: 26.3 mmol/L (ref 20.0–28.0)
Drawn by: 421801
O2 Saturation: 98.2 %
Patient temperature: 98.6
pCO2 arterial: 41 mmHg (ref 32.0–48.0)
pH, Arterial: 7.423 (ref 7.350–7.450)
pO2, Arterial: 108 mmHg (ref 83.0–108.0)

## 2019-04-15 LAB — CBC
HCT: 39.7 % (ref 39.0–52.0)
Hemoglobin: 13.2 g/dL (ref 13.0–17.0)
MCH: 32.4 pg (ref 26.0–34.0)
MCHC: 33.2 g/dL (ref 30.0–36.0)
MCV: 97.5 fL (ref 80.0–100.0)
Platelets: 286 10*3/uL (ref 150–400)
RBC: 4.07 MIL/uL — ABNORMAL LOW (ref 4.22–5.81)
RDW: 12.6 % (ref 11.5–15.5)
WBC: 10.5 10*3/uL (ref 4.0–10.5)
nRBC: 0 % (ref 0.0–0.2)

## 2019-04-15 LAB — TYPE AND SCREEN
ABO/RH(D): A POS
Antibody Screen: NEGATIVE

## 2019-04-15 LAB — APTT: aPTT: 36 seconds (ref 24–36)

## 2019-04-15 LAB — SURGICAL PCR SCREEN
MRSA, PCR: NEGATIVE
Staphylococcus aureus: NEGATIVE

## 2019-04-15 LAB — PROTIME-INR
INR: 1.7 — ABNORMAL HIGH (ref 0.8–1.2)
Prothrombin Time: 19.8 seconds — ABNORMAL HIGH (ref 11.4–15.2)

## 2019-04-15 IMAGING — CR DG CHEST 2V
3 series · 3 of 3 positions shown · non-contrast
Comparison: [DATE]

CLINICAL DATA: Preoperative

EXAM:
CHEST - 2 VIEW

[w chest lat (1 of 2)]
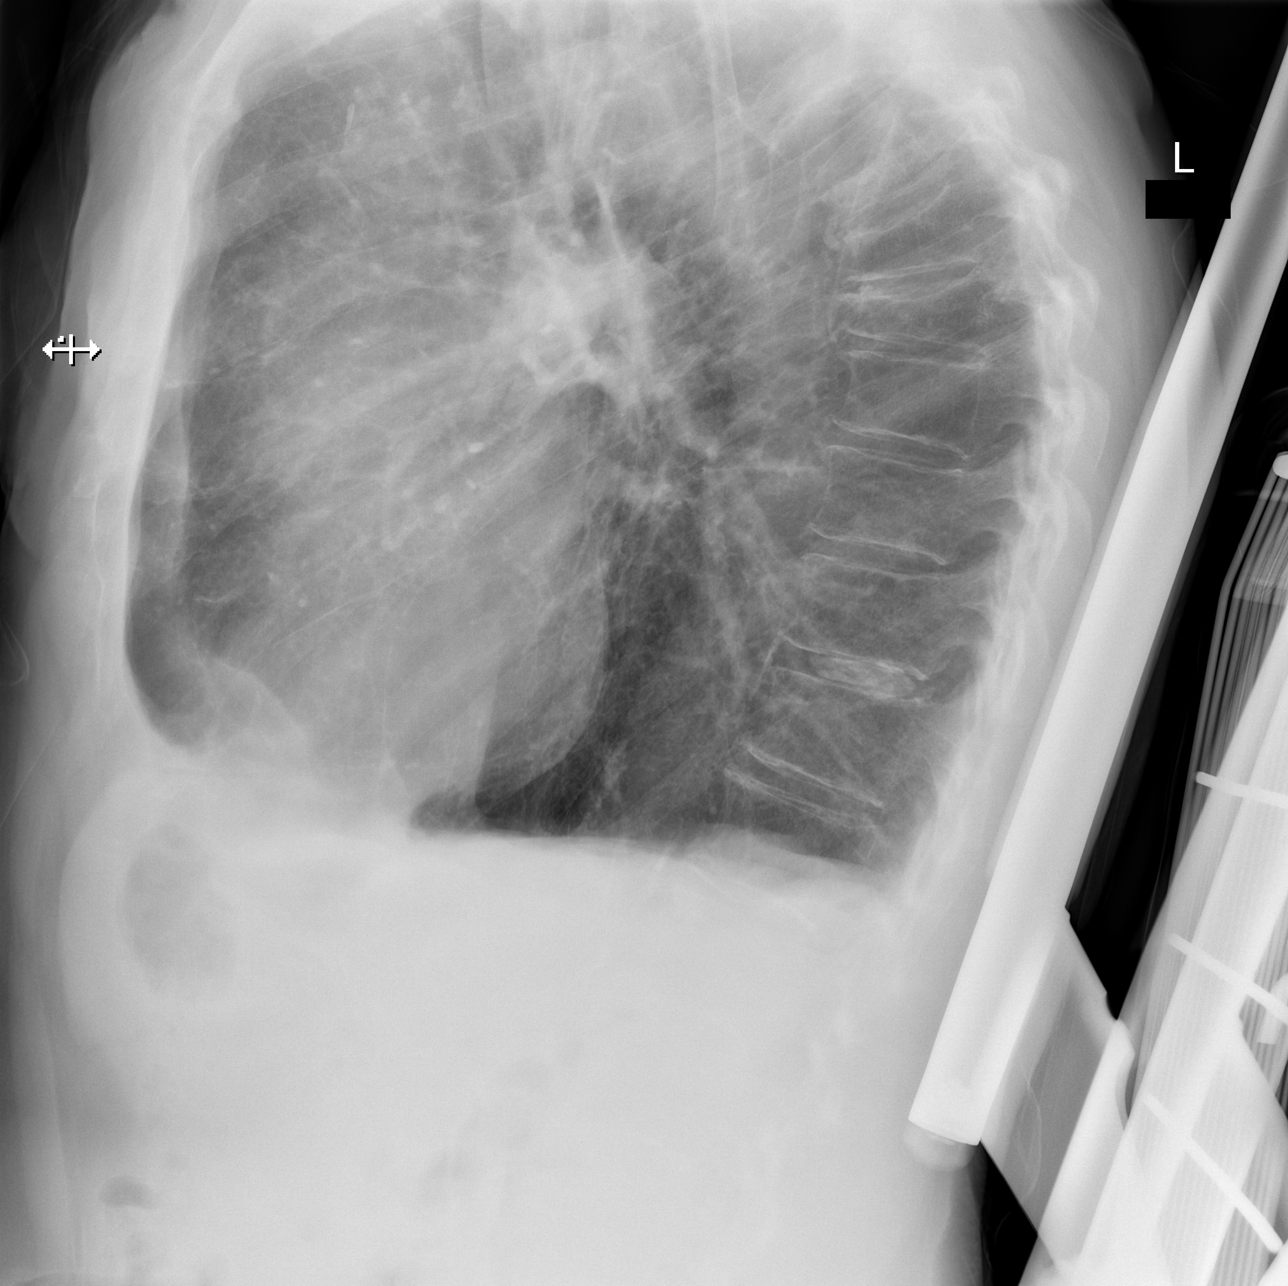

[w chest lat (2 of 2)]
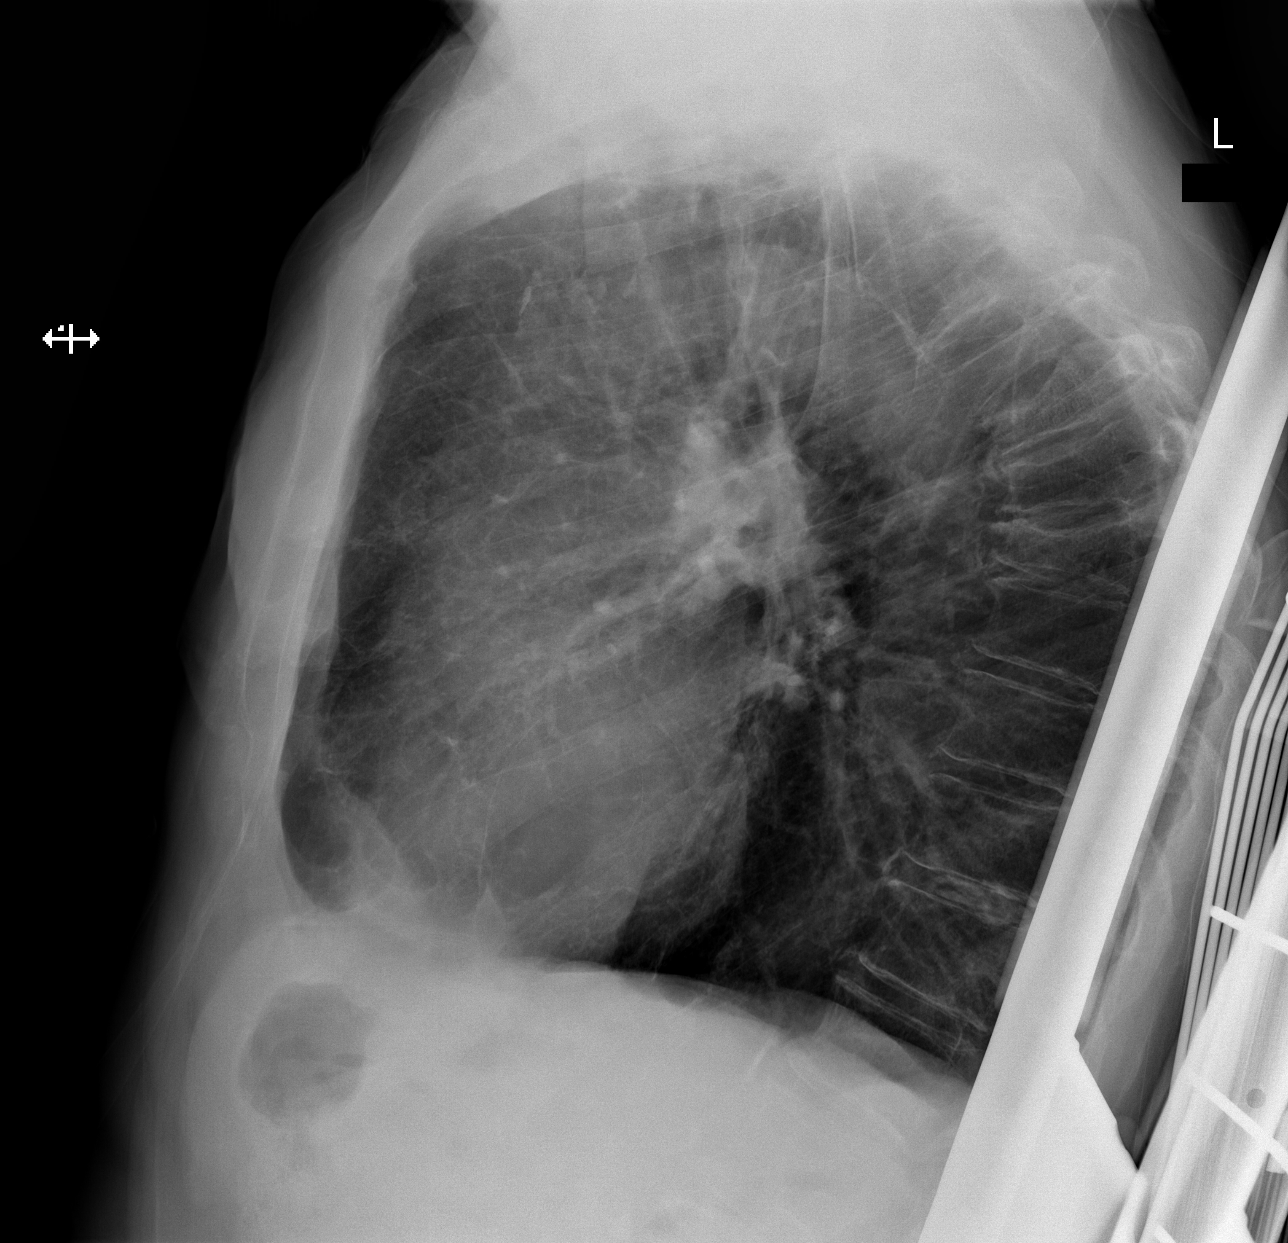

[x chest ap]
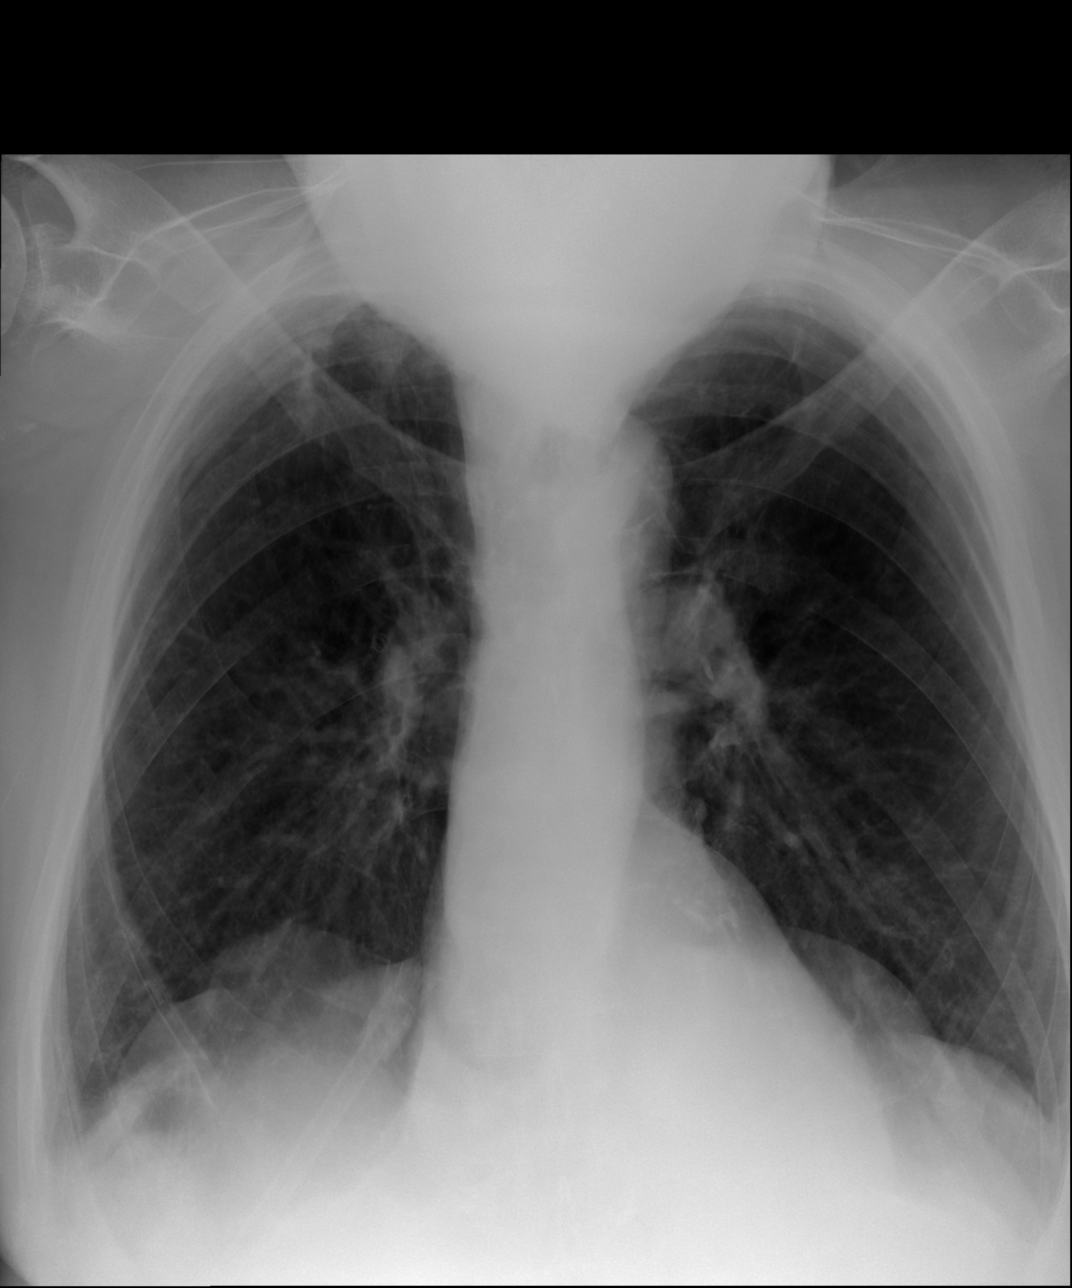

[3 of 3 positions shown; findings below may reference images not displayed]

FINDINGS: The heart size and mediastinal contours are within normal limits.
Pulmonary hyperinflation and emphysema. Biapical pleuroparenchymal
scarring, more conspicuous in the right apex. Disc degenerative
disease of the thoracic spine with multiple midthoracic wedge
deformities, unchanged from prior examination.
IMPRESSION: Pulmonary hyperinflation and emphysema without acute abnormality of
the lungs.

## 2019-04-15 NOTE — Progress Notes (Signed)
PCP: Dr. Garret Reddish Cardiologist: denies Pulmonary: Dr. Elveria Royals a tele-vist tomorrow with [name], NP  EKG: Today CXR: Today--call Jeneen Rinks, obtain per anesthesia order set ECHO: 2019 Stress Test: 2013 Cardiac Cath: denies  "Severe COPD"--recently had a "flare up", finished ABX course today, finished prednisone taper yesterday, still takes prednisone dose daily.  Reports he has not needed Albuterol inhaler as much--last week was using rescue inhaler 3-4 times a day, has only used albuterol inhaler x1 today.  On home oxygen 3L continuously--tolerates small amounts of activity at a time before needing to rest, but will only increase oxygen to 3.5L oxygen when needed.  Lasix prescribed daily prn, but pt reports he takes daily--has not had yet today since he had his appts.  Baldwin Crown with anesthesia called to see during appt.  Pt had questions about Xarelto.  Called office and spoke to [place], [place].  Last dose Xarelto to be 10/9--pt verbalizes understanding.  Pt with very fragile skin, has wound to R lower shin--happened a few weeks ago at MD office when it hit against a wheelchair.  Assessed during appt, approx 3cmx2cm round skin tear with skin flap intact.  Pt treating at home with xeroform dsg, gauze, kurlex, then secured with coban.  Surgeon office called to make aware, left message.  Patient denies shortness of breath, fever, cough, and chest pain at PAT appointment.  Patient verbalized understanding of instructions provided today at the PAT appointment.  Patient asked to review instructions at home and day of surgery.

## 2019-04-15 NOTE — Anesthesia Preprocedure Evaluation (Addendum)
Anesthesia Evaluation  Patient identified by MRN, date of birth, ID band Patient awake    Reviewed: Allergy & Precautions, H&P , NPO status , Patient's Chart, lab work & pertinent test results, reviewed documented beta blocker date and time   Airway Mallampati: II  TM Distance: >3 FB Neck ROM: full    Dental no notable dental hx.    Pulmonary shortness of breath, pneumonia, resolved, COPD,  COPD inhaler and oxygen dependent, former smoker,    Pulmonary exam normal breath sounds clear to auscultation       Cardiovascular Exercise Tolerance: Good hypertension, Pt. on medications + Peripheral Vascular Disease   Rhythm:regular Rate:Normal  Echo 4/19  Left ventricle: The cavity size was normal. Wall thickness was   normal. Systolic function was vigorous. The estimated ejection   fraction was in the range of 65% to 70%. Wall motion was normal;   there were no regional wall motion abnormalities. Doppler   parameters are consistent with abnormal left ventricular   relaxation (grade 1 diastolic dysfunction). - Aortic root: The aortic root was mildly dilated. - Ascending aorta: The ascending aorta was mildly dilated.   Neuro/Psych negative neurological ROS  negative psych ROS   GI/Hepatic negative GI ROS, Neg liver ROS,   Endo/Other  negative endocrine ROS  Renal/GU negative Renal ROS  negative genitourinary   Musculoskeletal   Abdominal   Peds  Hematology negative hematology ROS (+)   Anesthesia Other Findings Verified patient's last dose of Xarelto >72 hours prior to surgery, INR today 1.0, plts 286 on 10/8.   Reproductive/Obstetrics negative OB ROS                           Anesthesia Physical Anesthesia Plan  ASA: III  Anesthesia Plan: MAC and Spinal   Post-op Pain Management:    Induction:   PONV Risk Score and Plan: 2 and Ondansetron and Treatment may vary due to age or medical  condition  Airway Management Planned: Nasal Cannula, Simple Face Mask and Mask  Additional Equipment:   Intra-op Plan:   Post-operative Plan:   Informed Consent: I have reviewed the patients History and Physical, chart, labs and discussed the procedure including the risks, benefits and alternatives for the proposed anesthesia with the patient or authorized representative who has indicated his/her understanding and acceptance.     Dental Advisory Given  Plan Discussed with: CRNA and Anesthesiologist  Anesthesia Plan Comments: (Hx of severe COPD GOLD 4 with chronic resp failure on 3L continuous O2. Uses a wheelchair for transport, can walk maybe 50 ft before becoming significantly SOB. Follows with [name]. Treated for recent exacerbation, finishing antibiotics and prednisone taper today (he is on chronic daily prednisone as well). He reports that he felt very good on Monday 10/5, had resolution of symptoms, was using albuterol inhaler sparingly. He says for the last 2 days he has had slightly more coughing productive of clear sputum, using albuterol inhaler a couple times per day - which is essentially his baseline. On exam today he is not SOB at rest, he appears relatively comfortable. He has bilateral LE swelling 2/2 chronic venous insufficiency with associated skin changes. Lung sounds are very diminished, mild expiratory wheezing. Heart sounds are distant, regular, mildly tachycardic which as normal for the pt.   CXR preformed today shows pulmonary hyperinflation and emphysema without acute abnormality of the lungs.  Pt had pulmonology telehealth visit 10/9 with [name], NP for preop  evaluation. Per note "Patient would like to complete a right common femoral endarterectomy with [name] with vascular vein.  I believe it is reasonable for the patient to proceed forward with surgical intervention.  Patient with clear stable chest x-ray.  Patient is a moderate to high risk for  pulmonary complications based off of known chronic COPD.  I believe he is at his optimal point based off the conversation I have had with him today.  Okay to proceed forward with surgical intervention on 04/19/2019."   PT/INR noted to be mildly elevated on Preop labs. I do not have a good explanation for this, no previous coag studies available for review. Spinal anesthesia has been recommended given pt's severe COPD. Will repeat PT/INR DOS. Inbox message sent to [name]. Pt has also been instructed to hold Xarelto starting 10/9.  Lab         04/15/19           PT          19.8*     INR         1.7*          Of note, pt has multiple vertebral compression fractures and says if he has to lie on his back for more than a few hours it causes significant pain. Spends most of his time sitting upright in recliner.   TTE 11/04/17: - Left ventricle: The cavity size was normal. Wall thickness was   normal. Systolic function was vigorous. The estimated ejection   fraction was in the range of 65% to 70%. Wall motion was normal;   there were no regional wall motion abnormalities. Doppler   parameters are consistent with abnormal left ventricular   relaxation (grade 1 diastolic dysfunction). - Aortic root: The aortic root was mildly dilated. - Ascending aorta: The ascending aorta was mildly dilated.  Impressions:  - Definity used but suboptimal; vigorous LV systolic function; mild   diastolic dysfunction; mildly dilated aortic root and ascending   aorta. )     Anesthesia Quick Evaluation

## 2019-04-15 NOTE — Progress Notes (Signed)
Virtual Visit via Telephone Note  I connected with Francisco [name] on 04/16/19 at  9:30 AM EDT by telephone and verified that I am speaking with the correct person using two identifiers.  Location: Patient: Home Provider: [facility] - [address], [place], [place], [place] [address]   I discussed the limitations, risks, security and privacy concerns of performing an evaluation and management service by telephone and the availability of in person appointments. I also discussed with the patient that there may be a patient responsible charge related to this service. The patient expressed understanding and agreed to proceed.  Patient consented to consult via telephone: Yes People present and their role in pt care: Pt   History of Present Illness:  73 year old male former smoker followed in our office for COPD  PMH: Hypertension, osteoporosis, chronic PE Smoker/ Smoking History: Former smoker.  Quit smoking in 2019.  104-pack-year smoking history. Maintenance: Symbicort 160, Spiriva Respimat 2.5, 10 mg prednisone daily, daliresp Pt of: [name]  Chief complaint: Follow up    73 year old male former smoker followed in our office for COPD.  Patient preparing to have a  right common femoral endarterectomy with [name] with vascular vein specialist.  Patient was prescribed a prednisone taper last week as patient was having audible wheezing. Pt also was prescribed doxycycline.  Patient reports that he has been feeling better since starting treatment.  He reports he felt his best on 04/12/2019.  He reports that some congestion did return on 04/13/2019.  He reports that this is his baseline congestion and clear.  Likely related to his COPD.  Wheezing has also improved.  He completed a chest x-ray yesterday prior to surgical intervention on 04/19/2019 with [name].  Chest x-ray was clear just showing chronic emphysema.  EKG stable.  Patient with pending COVID test.  He  reports that the anesthesia PA also listen to his chest and reported the patient sounded fairly clear audibly.  Observations/Objective:  04/15/2019 - EKG - QTC - 424  04/07/2018-CTA- nonocclusive PE centrally involving branches of the right middle lobe and right lower lobe, no evidence of right heart strain  11/04/2017-echocardiogram-LV ejection fraction of 65 to 30%, grade 1 diastolic dysfunction  1/60/1093-ATFTDDUKG function test- FVC 4.59 (92% predicted), postbronchodilator ratio 29, postbronchodilator FEV1 1.41 (40% predicted), DLCO 72  11/27/2018-chest x-ray- no active cardiopulmonary disease, COPD  04/15/2019-chest x-ray-pulmonary hyperinflation emphysema without acute abnormality of the lungs  Assessment and Plan:  Chronic pulmonary embolism ([place]) Plan: Holding Xarelto for prior surgical intervention Restart Xarelto as indicated by surgical team   Chronic respiratory failure with hypoxia ([place]) Plan: Continue oxygen therapy as prescribed  Stage 4 very severe COPD by GOLD classification ([place]) Plan: Continue Symbicort 160 Continue Daliresp Continue Spiriva Respimat 2.5 Continue 10 mg of prednisone after finishing prednisone taper prescribed   Stable EKG.  I will discuss with [name] to see if it is reasonable for the patient to again trial to be on Monday Wednesday Friday azithromycin.  Surgical counseling visit Patient would like to complete a right common femoral endarterectomy with [name] with vascular vein.  I believe it is reasonable for the patient to proceed forward with surgical intervention.  Patient with clear stable chest x-ray.  Patient is a moderate to high risk for pulmonary complications based off of known chronic COPD.  I believe he is at his optimal point based off the conversation I have had with him today.  Okay to proceed  forward with surgical intervention on 04/19/2019.   Follow Up Instructions:  Return in about 2 weeks (around 04/30/2019), or  if symptoms worsen or fail to improve, for Follow up with [name] FNP-C, Follow up with Dr. Purnell Shoemaker.   I discussed the assessment and treatment plan with the patient. The patient was provided an opportunity to ask questions and all were answered. The patient agreed with the plan and demonstrated an understanding of the instructions.   The patient was advised to call back or seek an in-person evaluation if the symptoms worsen or if the condition fails to improve as anticipated.  I provided 24 minutes of non-face-to-face time during this encounter.   Lauraine Rinne, NP

## 2019-04-16 ENCOUNTER — Encounter: Payer: Self-pay | Admitting: Pulmonary Disease

## 2019-04-16 ENCOUNTER — Ambulatory Visit (INDEPENDENT_AMBULATORY_CARE_PROVIDER_SITE_OTHER): Payer: Medicare Other | Admitting: Pulmonary Disease

## 2019-04-16 ENCOUNTER — Telehealth: Payer: Self-pay | Admitting: Pulmonary Disease

## 2019-04-16 DIAGNOSIS — Z7189 Other specified counseling: Secondary | ICD-10-CM

## 2019-04-16 DIAGNOSIS — I2782 Chronic pulmonary embolism: Secondary | ICD-10-CM | POA: Diagnosis not present

## 2019-04-16 DIAGNOSIS — J9611 Chronic respiratory failure with hypoxia: Secondary | ICD-10-CM | POA: Diagnosis not present

## 2019-04-16 DIAGNOSIS — J449 Chronic obstructive pulmonary disease, unspecified: Secondary | ICD-10-CM

## 2019-04-16 LAB — NOVEL CORONAVIRUS, NAA (HOSP ORDER, SEND-OUT TO REF LAB; TAT 18-24 HRS): SARS-CoV-2, NAA: NOT DETECTED

## 2019-04-16 NOTE — Assessment & Plan Note (Signed)
Plan: Continue oxygen therapy as prescribed 

## 2019-04-16 NOTE — Assessment & Plan Note (Signed)
Plan: Holding Xarelto for prior surgical intervention Restart Xarelto as indicated by surgical team

## 2019-04-16 NOTE — Patient Instructions (Signed)
You were seen today by Lauraine Rinne, NP  for:   1. Stage 4 very severe COPD by GOLD classification ([place])  Continue Symbicort >>> 2 puffs in the morning right when you wake up, rinse out your mouth after use, 12 hours later 2 puffs, rinse after use >>> Take this daily, no matter what >>> This is not a rescue inhaler   Spiriva Respimat 2.5 >>> 2 puffs daily >>> Do this every day >>>This is not a rescue inhaler  Continue daily 10mg   prednisone after finishing prednisone taper  Continue Daliresp  I believe it is reasonable for [place] to restart you on Monday Wednesday Friday azithromycin after you have your surgical intervention  Note your daily symptoms > remember "red flags" for COPD:   >>>Increase in cough >>>increase in sputum production >>>increase in shortness of breath or activity  intolerance.   If you notice these symptoms, please call the office to be seen.   2. Chronic respiratory failure with hypoxia ([facility])  Continue oxygen therapy as prescribed  >>>maintain oxygen saturations greater than 88 percent  >>>if unable to maintain oxygen saturations please contact the office  >>>do not smoke with oxygen  >>>can use nasal saline gel or nasal saline rinses to moisturize nose if oxygen causes dryness   3. Surgical counseling visit  Okay to brief proceed forward with surgical intervention on 04/19/2019  4. Other chronic pulmonary embolism, unspecified whether acute cor pulmonale present ([place])  Continue Xarelto after surgery as indicated by surgical team   We recommend today:  No orders of the defined types were placed in this encounter.  No orders of the defined types were placed in this encounter.  No orders of the defined types were placed in this encounter.   Follow Up:    No follow-ups on file.   Please do your part to reduce the spread of COVID-19:      Reduce your risk of any infection  and COVID19 by using the similar precautions used for avoiding the  common cold or flu:  [name] Kitchen Wash your hands often with soap and warm water for at least 20 seconds.  If soap and water are not readily available, use an alcohol-based hand sanitizer with at least 60% alcohol.  . If coughing or sneezing, cover your mouth and nose by coughing or sneezing into the elbow areas of your shirt or coat, into a tissue or into your sleeve (not your hands). Langley Gauss A MASK when in public  . Avoid shaking hands with others and consider head nods or verbal greetings only. . Avoid touching your eyes, nose, or mouth with unwashed hands.  . Avoid close contact with people who are sick. . Avoid places or events with large numbers of people in one location, like concerts or sporting events. . If you have some symptoms but not all symptoms, continue to monitor at home and seek medical attention if your symptoms worsen. . If you are having a medical emergency, call 911.   [place] / e-Visit: [email or web address]         [facility]: [facility]: [phone]                   [facility]: [phone]     It is flu season:   >>> Best ways to protect herself from the flu: Receive the yearly flu vaccine, practice good hand hygiene washing  with soap and also using hand sanitizer when available, eat a nutritious meals, get adequate rest, hydrate appropriately   Please contact the office if your symptoms worsen or you have concerns that you are not improving.   Thank you for choosing [facility] for your healthcare, and for allowing [place] to partner with you on your healthcare journey. I am thankful to be able to provide care to you today.   [name] FNP-C

## 2019-04-16 NOTE — Telephone Encounter (Signed)
Left message for patient to call back. Patient had a televisit this morning with [name]. Patient will need a f/u appt in 2 weeks with either MR or [name].

## 2019-04-16 NOTE — Assessment & Plan Note (Signed)
Plan: Continue Symbicort 160 Continue Daliresp Continue Spiriva Respimat 2.5 Continue 10 mg of prednisone after finishing prednisone taper prescribed   Stable EKG.  I will discuss with [name] to see if it is reasonable for the patient to again trial to be on Monday Wednesday Friday azithromycin.

## 2019-04-16 NOTE — Progress Notes (Signed)
Anesthesia Chart Review: Hx of severe COPD GOLD 4 with chronic resp failure on 3L continuous O2. Uses a wheelchair for transport, can walk maybe 50 ft before becoming significantly SOB. Follows with [name]. Treated for recent exacerbation, finishing antibiotics and prednisone taper today (he is on chronic daily prednisone as well). He reports that he felt very good on Monday 10/5, had resolution of symptoms, was using albuterol inhaler sparingly. He says for the last 2 days he has had slightly more coughing productive of clear sputum, using albuterol inhaler a couple times per day - which is essentially his baseline. On exam today he is not SOB at rest, he appears relatively comfortable. He has bilateral LE swelling 2/2 chronic venous insufficiency with associated skin changes. Lung sounds are very diminished, mild expiratory wheezing. Heart sounds are distant, regular, mildly tachycardic which as normal for the pt.   CXR preformed today shows pulmonary hyperinflation and emphysema without acute abnormality of the lungs.  Pt had pulmonology telehealth visit 10/9 with [name], NP for preop evaluation. Per note "Patient would like to complete a right common femoral endarterectomy with [name] with vascular vein.  I believe it is reasonable for the patient to proceed forward with surgical intervention.  Patient with clear stable chest x-ray.  Patient is a moderate to high risk for pulmonary complications based off of known chronic COPD.  I believe he is at his optimal point based off the conversation I have had with him today.  Okay to proceed forward with surgical intervention on 04/19/2019."   PT/INR noted to be mildly elevated on Preop labs. I do not have a good explanation for this, no previous coag studies available for review. Spinal anesthesia has been recommended given pt's severe COPD. Will repeat PT/INR DOS. Inbox message sent to [name]. Pt has also been instructed to hold Xarelto  starting 10/9.  Lab         04/15/19           PT          19.8*     INR         1.7*          Of note, pt has multiple vertebral compression fractures and says if he has to lie on his back for more than a few hours it causes significant pain. Spends most of his time sitting upright in recliner.   TTE 11/04/17: - Left ventricle: The cavity size was normal. Wall thickness was   normal. Systolic function was vigorous. The estimated ejection   fraction was in the range of 65% to 70%. Wall motion was normal;   there were no regional wall motion abnormalities. Doppler   parameters are consistent with abnormal left ventricular   relaxation (grade 1 diastolic dysfunction). - Aortic root: The aortic root was mildly dilated. - Ascending aorta: The ascending aorta was mildly dilated.  Impressions:  - Definity used but suboptimal; vigorous LV systolic function; mild   diastolic dysfunction; mildly dilated aortic root and ascending   aorta.   Wynonia Musty [facility] Short Stay Center/Anesthesiology Phone [phone] 04/16/2019 10:55 AM

## 2019-04-16 NOTE — Telephone Encounter (Signed)
Patient has been scheduled to see MR on 10/27.

## 2019-04-16 NOTE — Assessment & Plan Note (Signed)
Patient would like to complete a right common femoral endarterectomy with [name] with vascular vein.  I believe it is reasonable for the patient to proceed forward with surgical intervention.  Patient with clear stable chest x-ray.  Patient is a moderate to high risk for pulmonary complications based off of known chronic COPD.  I believe he is at his optimal point based off the conversation I have had with him today.  Okay to proceed forward with surgical intervention on 04/19/2019.

## 2019-04-19 ENCOUNTER — Inpatient Hospital Stay (HOSPITAL_COMMUNITY): Payer: Medicare Other | Admitting: Physician Assistant

## 2019-04-19 ENCOUNTER — Encounter (HOSPITAL_COMMUNITY): Payer: Self-pay | Admitting: Surgery

## 2019-04-19 ENCOUNTER — Other Ambulatory Visit: Payer: Self-pay

## 2019-04-19 ENCOUNTER — Encounter (HOSPITAL_COMMUNITY)
Admission: RE | Disposition: A | Discharge status: Home / Self Care | Payer: Self-pay | Source: Home / Self Care | Attending: Vascular Surgery

## 2019-04-19 ENCOUNTER — Inpatient Hospital Stay (HOSPITAL_COMMUNITY)
Admission: RE | Admit: 2019-04-19 | Discharge: 2019-04-20 | DRG: 272 | Disposition: A | Discharge status: Home / Self Care | Payer: Medicare Other | Attending: Vascular Surgery | Admitting: Vascular Surgery

## 2019-04-19 DIAGNOSIS — Z86711 Personal history of pulmonary embolism: Secondary | ICD-10-CM

## 2019-04-19 DIAGNOSIS — L97519 Non-pressure chronic ulcer of other part of right foot with unspecified severity: Secondary | ICD-10-CM | POA: Diagnosis not present

## 2019-04-19 DIAGNOSIS — G8929 Other chronic pain: Secondary | ICD-10-CM | POA: Diagnosis not present

## 2019-04-19 DIAGNOSIS — Z20828 Contact with and (suspected) exposure to other viral communicable diseases: Secondary | ICD-10-CM | POA: Diagnosis present

## 2019-04-19 DIAGNOSIS — J439 Emphysema, unspecified: Secondary | ICD-10-CM | POA: Diagnosis not present

## 2019-04-19 DIAGNOSIS — Z85828 Personal history of other malignant neoplasm of skin: Secondary | ICD-10-CM

## 2019-04-19 DIAGNOSIS — Z9981 Dependence on supplemental oxygen: Secondary | ICD-10-CM

## 2019-04-19 DIAGNOSIS — Z7951 Long term (current) use of inhaled steroids: Secondary | ICD-10-CM

## 2019-04-19 DIAGNOSIS — M549 Dorsalgia, unspecified: Secondary | ICD-10-CM | POA: Diagnosis not present

## 2019-04-19 DIAGNOSIS — Z7952 Long term (current) use of systemic steroids: Secondary | ICD-10-CM

## 2019-04-19 DIAGNOSIS — I70235 Atherosclerosis of native arteries of right leg with ulceration of other part of foot: Secondary | ICD-10-CM | POA: Diagnosis not present

## 2019-04-19 DIAGNOSIS — Z79891 Long term (current) use of opiate analgesic: Secondary | ICD-10-CM | POA: Diagnosis not present

## 2019-04-19 DIAGNOSIS — I77810 Thoracic aortic ectasia: Secondary | ICD-10-CM | POA: Diagnosis present

## 2019-04-19 DIAGNOSIS — Z7901 Long term (current) use of anticoagulants: Secondary | ICD-10-CM

## 2019-04-19 DIAGNOSIS — J449 Chronic obstructive pulmonary disease, unspecified: Secondary | ICD-10-CM

## 2019-04-19 DIAGNOSIS — I739 Peripheral vascular disease, unspecified: Principal | ICD-10-CM | POA: Diagnosis present

## 2019-04-19 DIAGNOSIS — R0902 Hypoxemia: Secondary | ICD-10-CM | POA: Diagnosis present

## 2019-04-19 DIAGNOSIS — Z79899 Other long term (current) drug therapy: Secondary | ICD-10-CM

## 2019-04-19 DIAGNOSIS — Z01818 Encounter for other preprocedural examination: Secondary | ICD-10-CM

## 2019-04-19 HISTORY — DX: Emphysema, unspecified: J43.9

## 2019-04-19 HISTORY — DX: Dyspnea, unspecified: R06.00

## 2019-04-19 HISTORY — PX: PATCH ANGIOPLASTY: SHX6230

## 2019-04-19 HISTORY — PX: ENDARTERECTOMY FEMORAL: SHX5804

## 2019-04-19 LAB — PROTIME-INR
INR: 1 (ref 0.8–1.2)
Prothrombin Time: 13.3 seconds (ref 11.4–15.2)

## 2019-04-19 SURGERY — ENDARTERECTOMY, FEMORAL
Anesthesia: Monitor Anesthesia Care | Site: Groin | Laterality: Right

## 2019-04-19 MED ORDER — CHLORHEXIDINE GLUCONATE CLOTH 2 % EX PADS: 6.0000 | MEDICATED_PAD | Freq: Once | CUTANEOUS | Status: DC

## 2019-04-19 MED ORDER — HYDRALAZINE HCL 20 MG/ML IJ SOLN: 5.0000 mg | INTRAMUSCULAR | Status: DC | PRN

## 2019-04-19 MED ORDER — METOPROLOL TARTRATE 5 MG/5ML IV SOLN: 2.0000 mg | INTRAVENOUS | Status: DC | PRN

## 2019-04-19 MED ORDER — HEPARIN SODIUM (PORCINE) 1000 UNIT/ML IJ SOLN
INTRAMUSCULAR | Status: AC
  Filled 2019-04-19: qty 1

## 2019-04-19 MED ORDER — OXYCODONE HCL 5 MG PO TABS: 5.0000 mg | ORAL_TABLET | Freq: Once | ORAL | Status: DC | PRN

## 2019-04-19 MED ORDER — OXYCODONE HCL 5 MG/5ML PO SOLN: 5.0000 mg | Freq: Once | ORAL | Status: DC | PRN

## 2019-04-19 MED ORDER — PHENOL 1.4 % MT LIQD: 1.0000 | OROMUCOSAL | Status: DC | PRN

## 2019-04-19 MED ORDER — LIDOCAINE HCL 1 % IJ SOLN
INTRAMUSCULAR | Status: DC | PRN
  Administered 2019-04-19: 10 mL via INTRAMUSCULAR

## 2019-04-19 MED ORDER — HEPARIN SODIUM (PORCINE) 1000 UNIT/ML IJ SOLN
INTRAMUSCULAR | Status: DC | PRN
  Administered 2019-04-19: 5000 [IU] via INTRAVENOUS
  Administered 2019-04-19: 8000 [IU] via INTRAVENOUS

## 2019-04-19 MED ORDER — MIDAZOLAM HCL 5 MG/5ML IJ SOLN
INTRAMUSCULAR | Status: DC | PRN
  Administered 2019-04-19 (×2): 1 mg via INTRAVENOUS

## 2019-04-19 MED ORDER — ACETAMINOPHEN 160 MG/5ML PO SOLN: 325.0000 mg | ORAL | Status: DC | PRN

## 2019-04-19 MED ORDER — MIDAZOLAM HCL 2 MG/2ML IJ SOLN
INTRAMUSCULAR | Status: AC
  Filled 2019-04-19: qty 2

## 2019-04-19 MED ORDER — BUPIVACAINE-EPINEPHRINE (PF) 0.5% -1:200000 IJ SOLN
INTRAMUSCULAR | Status: AC
  Filled 2019-04-19: qty 30

## 2019-04-19 MED ORDER — LIDOCAINE 2% (20 MG/ML) 5 ML SYRINGE
INTRAMUSCULAR | Status: AC
  Filled 2019-04-19: qty 5

## 2019-04-19 MED ORDER — SODIUM CHLORIDE 0.9 % IV SOLN: INTRAVENOUS | Status: DC

## 2019-04-19 MED ORDER — MEPERIDINE HCL 25 MG/ML IJ SOLN: 6.2500 mg | INTRAMUSCULAR | Status: DC | PRN

## 2019-04-19 MED ORDER — TRAMADOL HCL 50 MG PO TABS
50.0000 mg | ORAL_TABLET | Freq: Four times a day (QID) | ORAL | Status: DC | PRN
  Administered 2019-04-19 – 2019-04-20 (×2): 50 mg via ORAL
  Filled 2019-04-19 (×2): qty 1

## 2019-04-19 MED ORDER — SODIUM CHLORIDE 0.9 % IV SOLN: 500.0000 mL | Freq: Once | INTRAVENOUS | Status: DC | PRN

## 2019-04-19 MED ORDER — POTASSIUM CHLORIDE CRYS ER 20 MEQ PO TBCR: 20.0000 meq | EXTENDED_RELEASE_TABLET | Freq: Every day | ORAL | Status: DC | PRN

## 2019-04-19 MED ORDER — TETRAHYDROZOLINE HCL 0.05 % OP SOLN
1.0000 [drp] | Freq: Every day | OPHTHALMIC | Status: DC | PRN
  Filled 2019-04-19: qty 15

## 2019-04-19 MED ORDER — PROTAMINE SULFATE 10 MG/ML IV SOLN
INTRAVENOUS | Status: DC | PRN
  Administered 2019-04-19: 50 mg via INTRAVENOUS

## 2019-04-19 MED ORDER — DEXAMETHASONE SODIUM PHOSPHATE 10 MG/ML IJ SOLN
INTRAMUSCULAR | Status: AC
  Filled 2019-04-19: qty 1

## 2019-04-19 MED ORDER — ONDANSETRON HCL 4 MG/2ML IJ SOLN
INTRAMUSCULAR | Status: AC
  Filled 2019-04-19: qty 2

## 2019-04-19 MED ORDER — FUROSEMIDE 20 MG PO TABS
20.0000 mg | ORAL_TABLET | Freq: Every day | ORAL | Status: DC
  Administered 2019-04-20: 20 mg via ORAL
  Filled 2019-04-19: qty 1

## 2019-04-19 MED ORDER — ONDANSETRON HCL 4 MG/2ML IJ SOLN: 4.0000 mg | Freq: Four times a day (QID) | INTRAMUSCULAR | Status: DC | PRN

## 2019-04-19 MED ORDER — SODIUM CHLORIDE 0.9 % IV SOLN
INTRAVENOUS | Status: DC | PRN
  Administered 2019-04-19: 25 ug/min via INTRAVENOUS

## 2019-04-19 MED ORDER — ROFLUMILAST 500 MCG PO TABS
500.0000 ug | ORAL_TABLET | Freq: Every day | ORAL | Status: DC
  Administered 2019-04-20: 500 ug via ORAL
  Filled 2019-04-19: qty 1

## 2019-04-19 MED ORDER — FENTANYL CITRATE (PF) 100 MCG/2ML IJ SOLN: 25.0000 ug | INTRAMUSCULAR | Status: DC | PRN

## 2019-04-19 MED ORDER — POLYETHYLENE GLYCOL 3350 17 G PO PACK: 17.0000 g | PACK | Freq: Every day | ORAL | Status: DC | PRN

## 2019-04-19 MED ORDER — PREDNISONE 10 MG PO TABS
10.0000 mg | ORAL_TABLET | Freq: Every day | ORAL | Status: DC
  Administered 2019-04-20: 10 mg via ORAL
  Filled 2019-04-19: qty 1

## 2019-04-19 MED ORDER — CEFAZOLIN SODIUM-DEXTROSE 2-4 GM/100ML-% IV SOLN
2.0000 g | Freq: Three times a day (TID) | INTRAVENOUS | Status: AC
  Administered 2019-04-19 – 2019-04-20 (×2): 2 g via INTRAVENOUS
  Filled 2019-04-19 (×2): qty 100

## 2019-04-19 MED ORDER — CEFAZOLIN SODIUM-DEXTROSE 2-4 GM/100ML-% IV SOLN
INTRAVENOUS | Status: AC
  Filled 2019-04-19: qty 100

## 2019-04-19 MED ORDER — UMECLIDINIUM BROMIDE 62.5 MCG/INH IN AEPB
1.0000 | INHALATION_SPRAY | Freq: Every day | RESPIRATORY_TRACT | Status: DC
  Filled 2019-04-19: qty 7

## 2019-04-19 MED ORDER — DOCUSATE SODIUM 100 MG PO CAPS
100.0000 mg | ORAL_CAPSULE | Freq: Every day | ORAL | Status: DC
  Administered 2019-04-20: 100 mg via ORAL
  Filled 2019-04-19: qty 1

## 2019-04-19 MED ORDER — ONDANSETRON HCL 4 MG/2ML IJ SOLN: 4.0000 mg | Freq: Once | INTRAMUSCULAR | Status: DC | PRN

## 2019-04-19 MED ORDER — LIDOCAINE HCL (PF) 1 % IJ SOLN
INTRAMUSCULAR | Status: AC
  Filled 2019-04-19: qty 30

## 2019-04-19 MED ORDER — PANTOPRAZOLE SODIUM 40 MG PO TBEC
40.0000 mg | DELAYED_RELEASE_TABLET | Freq: Every day | ORAL | Status: DC
  Administered 2019-04-20: 40 mg via ORAL
  Filled 2019-04-19: qty 1

## 2019-04-19 MED ORDER — CEFAZOLIN SODIUM-DEXTROSE 2-4 GM/100ML-% IV SOLN
2.0000 g | INTRAVENOUS | Status: AC
  Administered 2019-04-19: 2 g via INTRAVENOUS

## 2019-04-19 MED ORDER — ACETAMINOPHEN 325 MG PO TABS
325.0000 mg | ORAL_TABLET | ORAL | Status: DC | PRN
  Administered 2019-04-20: 650 mg via ORAL
  Filled 2019-04-19: qty 2

## 2019-04-19 MED ORDER — ONDANSETRON HCL 4 MG/2ML IJ SOLN
INTRAMUSCULAR | Status: DC | PRN
  Administered 2019-04-19: 4 mg via INTRAVENOUS

## 2019-04-19 MED ORDER — HEMOSTATIC AGENTS (NO CHARGE) OPTIME
TOPICAL | Status: DC | PRN
  Administered 2019-04-19: 1 via TOPICAL

## 2019-04-19 MED ORDER — ALBUTEROL SULFATE (2.5 MG/3ML) 0.083% IN NEBU: 3.0000 mL | INHALATION_SOLUTION | Freq: Four times a day (QID) | RESPIRATORY_TRACT | Status: DC | PRN

## 2019-04-19 MED ORDER — PROPOFOL 10 MG/ML IV BOLUS
INTRAVENOUS | Status: DC | PRN
  Administered 2019-04-19: 10 mg via INTRAVENOUS

## 2019-04-19 MED ORDER — 0.9 % SODIUM CHLORIDE (POUR BTL) OPTIME
TOPICAL | Status: DC | PRN
  Administered 2019-04-19: 2000 mL

## 2019-04-19 MED ORDER — BISACODYL 10 MG RE SUPP: 10.0000 mg | Freq: Every day | RECTAL | Status: DC | PRN

## 2019-04-19 MED ORDER — LABETALOL HCL 5 MG/ML IV SOLN: 10.0000 mg | INTRAVENOUS | Status: DC | PRN

## 2019-04-19 MED ORDER — GUAIFENESIN-DM 100-10 MG/5ML PO SYRP: 15.0000 mL | ORAL_SOLUTION | ORAL | Status: DC | PRN

## 2019-04-19 MED ORDER — PROPOFOL 500 MG/50ML IV EMUL
INTRAVENOUS | Status: DC | PRN
  Administered 2019-04-19: 50 ug/kg/min via INTRAVENOUS

## 2019-04-19 MED ORDER — FENTANYL CITRATE (PF) 250 MCG/5ML IJ SOLN
INTRAMUSCULAR | Status: AC
  Filled 2019-04-19: qty 5

## 2019-04-19 MED ORDER — ALUM & MAG HYDROXIDE-SIMETH 200-200-20 MG/5ML PO SUSP: 15.0000 mL | ORAL | Status: DC | PRN

## 2019-04-19 MED ORDER — MORPHINE SULFATE (PF) 2 MG/ML IV SOLN: 2.0000 mg | INTRAVENOUS | Status: DC | PRN

## 2019-04-19 MED ORDER — MAGNESIUM SULFATE 2 GM/50ML IV SOLN: 2.0000 g | Freq: Every day | INTRAVENOUS | Status: DC | PRN

## 2019-04-19 MED ORDER — TIOTROPIUM BROMIDE MONOHYDRATE 2.5 MCG/ACT IN AERS: 2.0000 | INHALATION_SPRAY | Freq: Every day | RESPIRATORY_TRACT | Status: DC

## 2019-04-19 MED ORDER — LACTATED RINGERS IV SOLN
INTRAVENOUS | Status: DC
  Administered 2019-04-19 (×2): via INTRAVENOUS

## 2019-04-19 MED ORDER — LIDOCAINE 2% (20 MG/ML) 5 ML SYRINGE
INTRAMUSCULAR | Status: DC | PRN
  Administered 2019-04-19: 40 mg via INTRAVENOUS

## 2019-04-19 MED ORDER — HEPARIN SODIUM (PORCINE) 1000 UNIT/ML IJ SOLN
INTRAMUSCULAR | Status: AC
  Filled 2019-04-19: qty 3

## 2019-04-19 MED ORDER — SODIUM CHLORIDE 0.9 % IV SOLN
INTRAVENOUS | Status: DC | PRN
  Administered 2019-04-19: 500 mL

## 2019-04-19 MED ORDER — SODIUM CHLORIDE 0.9 % IV SOLN
INTRAVENOUS | Status: AC
  Filled 2019-04-19: qty 1.2

## 2019-04-19 MED ORDER — MOMETASONE FURO-FORMOTEROL FUM 200-5 MCG/ACT IN AERO
2.0000 | INHALATION_SPRAY | Freq: Two times a day (BID) | RESPIRATORY_TRACT | Status: DC
  Filled 2019-04-19: qty 8.8

## 2019-04-19 MED ORDER — ACETAMINOPHEN 325 MG RE SUPP: 325.0000 mg | RECTAL | Status: DC | PRN

## 2019-04-19 MED ORDER — FLUTICASONE PROPIONATE 50 MCG/ACT NA SUSP
2.0000 | Freq: Every day | NASAL | Status: DC
  Administered 2019-04-20: 2 via NASAL
  Filled 2019-04-19: qty 16

## 2019-04-19 MED ORDER — DEXAMETHASONE SODIUM PHOSPHATE 10 MG/ML IJ SOLN
INTRAMUSCULAR | Status: DC | PRN
  Administered 2019-04-19: 10 mg via INTRAVENOUS

## 2019-04-19 MED ORDER — ACETAMINOPHEN 325 MG PO TABS: 325.0000 mg | ORAL_TABLET | ORAL | Status: DC | PRN

## 2019-04-19 MED ORDER — PROTAMINE SULFATE 10 MG/ML IV SOLN
INTRAVENOUS | Status: AC
  Filled 2019-04-19: qty 5

## 2019-04-19 SURGICAL SUPPLY — 45 items
ADH SKN CLS APL DERMABOND .7 (GAUZE/BANDAGES/DRESSINGS) ×1
AGENT HMST SPONGE THK3/8 (HEMOSTASIS) ×1
CANISTER SUCT 3000ML PPV (MISCELLANEOUS) ×2 IMPLANT
CANNULA VESSEL 3MM 2 BLNT TIP (CANNULA) ×2 IMPLANT
CLIP VESOCCLUDE MED 24/CT (CLIP) ×2 IMPLANT
CLIP VESOCCLUDE SM WIDE 24/CT (CLIP) ×2 IMPLANT
COVER WAND RF STERILE (DRAPES) ×2 IMPLANT
DERMABOND ADVANCED (GAUZE/BANDAGES/DRESSINGS) ×1
DERMABOND ADVANCED .7 DNX12 (GAUZE/BANDAGES/DRESSINGS) ×1 IMPLANT
DRAIN HEMOVAC 1/8 X 5 (WOUND CARE) IMPLANT
ELECT REM PT RETURN 9FT ADLT (ELECTROSURGICAL) ×2
ELECTRODE REM PT RTRN 9FT ADLT (ELECTROSURGICAL) ×1 IMPLANT
EVACUATOR SILICONE 100CC (DRAIN) IMPLANT
GLOVE BIO SURGEON STRL SZ 6.5 (GLOVE) ×5 IMPLANT
GLOVE BIO SURGEON STRL SZ7.5 (GLOVE) ×2 IMPLANT
GLOVE BIOGEL PI IND STRL 6.5 (GLOVE) IMPLANT
GLOVE BIOGEL PI IND STRL 7.0 (GLOVE) IMPLANT
GLOVE BIOGEL PI INDICATOR 6.5 (GLOVE) ×1
GLOVE BIOGEL PI INDICATOR 7.0 (GLOVE) ×1
GLOVE SURG SS PI 6.5 STRL IVOR (GLOVE) ×1 IMPLANT
GOWN STRL REUS W/ TWL LRG LVL3 (GOWN DISPOSABLE) ×3 IMPLANT
GOWN STRL REUS W/TWL LRG LVL3 (GOWN DISPOSABLE) ×8
HEMOSTAT SPONGE AVITENE ULTRA (HEMOSTASIS) ×1 IMPLANT
KIT BASIN OR (CUSTOM PROCEDURE TRAY) ×2 IMPLANT
KIT TURNOVER KIT B (KITS) ×2 IMPLANT
LOOP VESSEL MAXI BLUE (MISCELLANEOUS) ×1 IMPLANT
NS IRRIG 1000ML POUR BTL (IV SOLUTION) ×4 IMPLANT
PACK PERIPHERAL VASCULAR (CUSTOM PROCEDURE TRAY) ×2 IMPLANT
PAD ARMBOARD 7.5X6 YLW CONV (MISCELLANEOUS) ×4 IMPLANT
PATCH VASC XENOSURE 1CMX6CM (Vascular Products) ×2 IMPLANT
PATCH VASC XENOSURE 1X6 (Vascular Products) IMPLANT
SPONGE INTESTINAL PEANUT (DISPOSABLE) ×1 IMPLANT
SUT ETHILON 3 0 PS 1 (SUTURE) IMPLANT
SUT PROLENE 5 0 C 1 24 (SUTURE) ×2 IMPLANT
SUT PROLENE 6 0 CC (SUTURE) ×2 IMPLANT
SUT PROLENE 7 0 BV1 MDA (SUTURE) ×1 IMPLANT
SUT VIC AB 2-0 SH 27 (SUTURE) ×2
SUT VIC AB 2-0 SH 27XBRD (SUTURE) ×1 IMPLANT
SUT VIC AB 3-0 SH 27 (SUTURE) ×2
SUT VIC AB 3-0 SH 27X BRD (SUTURE) ×1 IMPLANT
SUT VIC AB 4-0 PS2 18 (SUTURE) ×1 IMPLANT
TOWEL GREEN STERILE (TOWEL DISPOSABLE) ×2 IMPLANT
TRAY FOLEY SLVR 16FR LF STAT (SET/KITS/TRAYS/PACK) ×1 IMPLANT
UNDERPAD 30X30 (UNDERPADS AND DIAPERS) ×2 IMPLANT
WATER STERILE IRR 1000ML POUR (IV SOLUTION) ×2 IMPLANT

## 2019-04-19 NOTE — Interval H&P Note (Signed)
History and Physical Interval Note:  04/19/2019 3:28 PM  Francisco [name]  has presented today for surgery, with the diagnosis of femoral occlusion.  The various methods of treatment have been discussed with the patient and family. After consideration of risks, benefits and other options for treatment, the patient has consented to  Procedure(s): ENDARTERECTOMY RIGHT COMMON FEMORAL (Right) as a surgical intervention.  The patient's history has been reviewed, patient examined, no change in status, stable for surgery.  I have reviewed the patient's chart and labs.  Questions were answered to the patient's satisfaction.     Ruta Hinds

## 2019-04-19 NOTE — Transfer of Care (Signed)
Immediate Anesthesia Transfer of Care Note  Patient: Francisco [name]  Procedure(s) Performed: ENDARTERECTOMY RIGHT COMMON FEMORAL (Right Groin) Patch Angioplasty (Right Groin)  Patient Location: PACU  Anesthesia Type:Spinal  Level of Consciousness: awake, alert  and oriented  Airway & Oxygen Therapy: Patient Spontanous Breathing and Patient connected to nasal cannula oxygen  Post-op Assessment: Report given to RN, Post -op Vital signs reviewed and stable and Pt had spinal  Moving upper ext only  Post vital signs: Reviewed and stable  Last Vitals:  Vitals Value Taken Time  BP 136/85 04/19/19 1850  Temp 36.1 C 04/19/19 1850  Pulse 88 04/19/19 1853  Resp 16 04/19/19 1853  SpO2 100 % 04/19/19 1853  Vitals shown include unvalidated device data.  Last Pain:  Vitals:   04/19/19 1127  TempSrc:   PainSc: 4       Patients Stated Pain Goal: 4 (33/61/22 4497)  Complications: No apparent anesthesia complications

## 2019-04-19 NOTE — Progress Notes (Signed)
73 year old male underwent right common femoral endarterectomy with bovine patch earlier today.  Reportedly had an L3-L4 spinal.  Patient did receive intraoperative heparin for his endarterectomy.  Anesthesia is concerned he has been slow to move his legs post procedure in recovery.  On my assessment he is able to bend both knees now which is an improvement over the last 15 minutes.  He is starting to feel sensation in his feet.  Nursing and recovery feels like this is an improvement.  Will allow him to go to the floor.  I have spoken with Dr. Nyoka Cowden with anesthesia who remains concerned about his exam.  After discussing with radiology they recommend MRI of the lumbar spine with and without contrast which has been ordered stat to rule out hematoma, etc.  Marty Heck, MD [facility] Office: [phone] Pager: [phone]  Marty Heck

## 2019-04-19 NOTE — Op Note (Signed)
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  The patient placed supine position operating table.  After placing a spinal anesthesia by the anesthesia team, patient's entire right lower extremities prepped and draped in usual sterile fashion.  Longitudinal incision was made in the right groin after infiltrating half percent Marcaine with epinephrine and 1% lidocaine.  Incision was carried down through the subcutaneous tissues down level the right common femoral artery.  This was heavily calcified.  There was no pulse within it.  Dissection was carried up underneath the inguinal ligament and one circumflex iliac branch was dissected free circumferentially and Vesseloops placed around this.  An additional Vesseloops placed around the distal external iliac artery.  This was soft right above the branch.  Dissection was then carried down to the profunda and superficial femoral artery there were several profunda branches these were dissected free circumferentially and Vesseloops placed around these.  Superficial femoral artery was dissected free about 3 cm after its origin on Vesely placed around this.  This point patient was given 10,000 units of intravenous heparin.  He was given additional bolus of 5000 units of heparin during the course of the case.  Longitudinal opening was made in the common femoral artery.  There was some fresh thrombus within it.  There was subtotal occlusion.  Endarterectomy was begun on a suitable plane and I was able to  endarterectomized all the way up to the distal portion of the external iliac artery.  There was severe calcific plaque.  A reasonable proximal endpoint was obtained.  Plaque was then elevated down towards the femoral bifurcation and I was able to obtain a pretty reasonable endpoint in the superficial femoral artery origin.  There was some elevation of an intimal flap posteriorly and this was tacked with four 7-0 Prolene sutures.  A bovine pericardial patch was then brought up in the operative field and sewn as a patch angioplasty using a running 6-0 Prolene suture.  Despite completion anastomosis it was for blood backbled and thoroughly flushed anastomosis was secured clamps released there is pulsatile flow in the artery immediately.  One repair stitch was placed.  There was good Doppler flow in the origin of the SFA and profunda branches.  There was brisk biphasic posterior tibial and dorsalis pedis Doppler flow at the end of the case.  Hemostasis was obtained with the assistance of 50 mg of protamine as well as Avitene applied to the suture line.  After hemostasis was obtained the groin was closed in multiple layers with running 2/3 0 Vicryl suture and a 4-0 Vicryl subcuticular stitch in the skin.  Patient tolerated the procedure well and there were no complications.  Instrument sponge and needle count was correct the end of the case.  Ruta Hinds, MD [facility] Office: [phone] Pager: [phone]

## 2019-04-19 NOTE — Anesthesia Procedure Notes (Signed)
Spinal  Patient location during procedure: OR Staffing Anesthesiologist: Lidia Collum, MD Performed: anesthesiologist  Preanesthetic Checklist Completed: patient identified, surgical consent, pre-op evaluation, timeout performed, IV checked, risks and benefits discussed and monitors and equipment checked Spinal Block Patient position: sitting Prep: site prepped and draped and DuraPrep Patient monitoring: continuous pulse ox, blood pressure and heart rate Approach: midline Location: L3-4 Injection technique: single-shot Needle Needle type: Pencan  Needle gauge: 24 G Needle length: 9 cm Additional Notes Single pass, atraumatic spinal placement with 24 gauge Pencan. No heme.

## 2019-04-19 NOTE — Discharge Instructions (Signed)
 [facility]  Discharge instructions  Lower Extremity Bypass Surgery  Please refer to the following instruction for your post-procedure care. Your surgeon or physician assistant will discuss any changes with you.  Activity  You are encouraged to walk as much as you can. You can slowly return to normal activities during the month after your surgery. Avoid strenuous activity and heavy lifting until your doctor tells you it's OK. Avoid activities such as vacuuming or swinging a golf club. Do not drive until your doctor give the OK and you are no longer taking prescription pain medications. It is also normal to have difficulty with sleep habits, eating and bowel movement after surgery. These will go away with time.  Bathing/Showering  Shower daily after you go home. Do not soak in a bathtub, hot tub, or swim until the incision heals completely.  Incision Care  Clean your incision with mild soap and water. Shower every day. Pat the area dry with a clean towel. You do not need a bandage unless otherwise instructed. Do not apply any ointments or creams to your incision. If you have open wounds you will be instructed how to care for them or a visiting nurse may be arranged for you. If you have staples or sutures along your incision they will be removed at your post-op appointment. You may have skin glue on your incision. Do not peel it off. It will come off on its own in about one week.  Wash the groin wound with soap and water daily and pat dry. (No tub bath-only shower)  Then put a dry gauze or washcloth in the groin to keep this area dry to help prevent wound infection.  Do this daily and as needed.  Do not use Vaseline or neosporin on your incisions.  Only use soap and water on your incisions and then protect and keep dry.  Diet  Resume your normal diet. There are no special food restrictions following this procedure. A low fat/ low cholesterol diet is  recommended for all patients with vascular disease. In order to heal from your surgery, it is CRITICAL to get adequate nutrition. Your body requires vitamins, minerals, and protein. Vegetables are the best source of vitamins and minerals. Vegetables also provide the perfect balance of protein. Processed food has little nutritional value, so try to avoid this.  Medications  Resume taking all your medications unless your doctor or physician assistant tells you not to. If your incision is causing pain, you may take over-the-counter pain relievers such as acetaminophen (Tylenol). If you were prescribed a stronger pain medication, please aware these medication can cause nausea and constipation. Prevent nausea by taking the medication with a snack or meal. Avoid constipation by drinking plenty of fluids and eating foods with high amount of fiber, such as fruits, vegetables, and grains. Take Colace 100 mg (an over-the-counter stool softener) twice a day as needed for constipation.  Do not take Tylenol if you are taking prescription pain medications.  Follow Up  Our office will schedule a follow up appointment 2-3 weeks following discharge.  Please call us immediately for any of the following conditions  Severe or worsening pain in your legs or feet while at rest or while walking Increase pain, redness, warmth, or drainage (pus) from your incision site(s) Fever of 101 degree or higher The swelling in your leg with the bypass suddenly worsens and becomes more painful than when you were in the hospital If you have   been instructed to feel your graft pulse then you should do so every day. If you can no longer feel this pulse, call the office immediately. Not all patients are given this instruction.  Leg swelling is common after leg bypass surgery.  The swelling should improve over a few months following surgery. To improve the swelling, you may elevate your legs above the level of your heart while you are  sitting or resting. Your surgeon or physician assistant may ask you to apply an ACE wrap or wear compression (TED) stockings to help to reduce swelling.  Reduce your risk of vascular disease  Stop smoking. If you would like help call [facility] at 1-800-QUIT-NOW (1-800-784-8669) or [place] at 336-586-4000.  Manage your cholesterol Maintain a desired weight Control your diabetes weight Control your diabetes Keep your blood pressure down  If you have any questions, please call the office at [phone]  

## 2019-04-20 ENCOUNTER — Encounter (HOSPITAL_COMMUNITY): Payer: Self-pay | Admitting: Vascular Surgery

## 2019-04-20 ENCOUNTER — Encounter (HOSPITAL_COMMUNITY): Payer: Medicare Other

## 2019-04-20 ENCOUNTER — Inpatient Hospital Stay (HOSPITAL_COMMUNITY): Payer: Medicare Other

## 2019-04-20 LAB — BASIC METABOLIC PANEL
Anion gap: 13 (ref 5–15)
BUN: 12 mg/dL (ref 8–23)
CO2: 25 mmol/L (ref 22–32)
Calcium: 8.3 mg/dL — ABNORMAL LOW (ref 8.9–10.3)
Chloride: 99 mmol/L (ref 98–111)
Creatinine, Ser: 0.85 mg/dL (ref 0.61–1.24)
GFR calc Af Amer: >60 mL/min (ref >60)
GFR calc non Af Amer: >60 mL/min (ref >60)
Glucose, Bld: 131 mg/dL — ABNORMAL HIGH (ref 70–99)
Potassium: 4.3 mmol/L (ref 3.5–5.1)
Sodium: 137 mmol/L (ref 135–145)

## 2019-04-20 LAB — CBC
HCT: 32.6 % — ABNORMAL LOW (ref 39.0–52.0)
Hemoglobin: 11.2 g/dL — ABNORMAL LOW (ref 13.0–17.0)
MCH: 33 pg (ref 26.0–34.0)
MCHC: 34.4 g/dL (ref 30.0–36.0)
MCV: 96.2 fL (ref 80.0–100.0)
Platelets: 194 10*3/uL (ref 150–400)
RBC: 3.39 MIL/uL — ABNORMAL LOW (ref 4.22–5.81)
RDW: 12.7 % (ref 11.5–15.5)
WBC: 8.2 10*3/uL (ref 4.0–10.5)
nRBC: 0 % (ref 0.0–0.2)

## 2019-04-20 IMAGING — MR MR LUMBAR SPINE WO/W CM
4 of 7 series · 26 of 48 positions shown · IV contrast (Gadavist)
Comparison: None.

CLINICAL DATA: Difficulty moving legs after femoral endarterectomy
and L3-4 spinal anesthesia.

EXAM:
MRI LUMBAR SPINE WITHOUT AND WITH CONTRAST
TECHNIQUE: Multiplanar and multiecho pulse sequences of the lumbar spine were
obtained without and with intravenous contrast.
CONTRAST:  7.5mL GADAVIST GADOBUTROL 1 MMOL/ML IV SOLN

[Series 7: T2 · sagittal · 4.0mm · 0.73mm/px · 6 of 17 slices shown (1 of 2)]
[im 1/17]
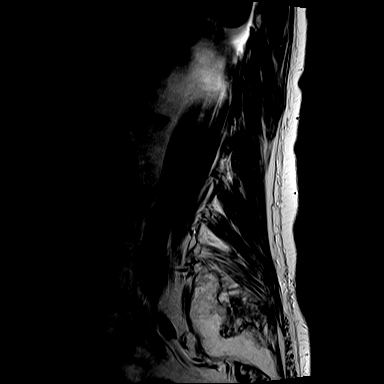
[im 4/17]
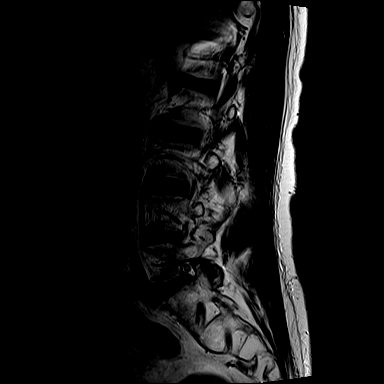
[im 7/17]
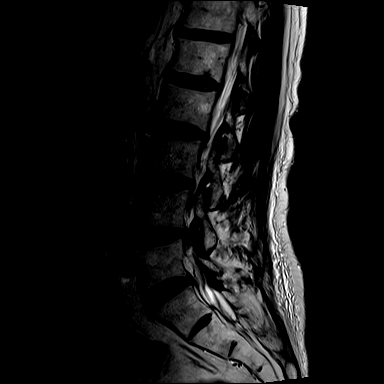
[im 10/17]
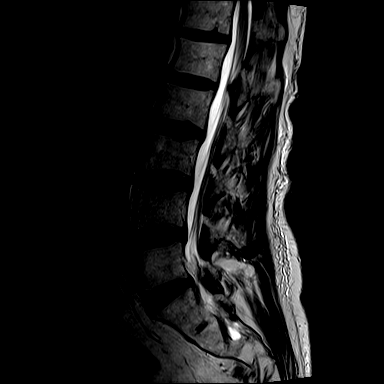
[im 13/17]
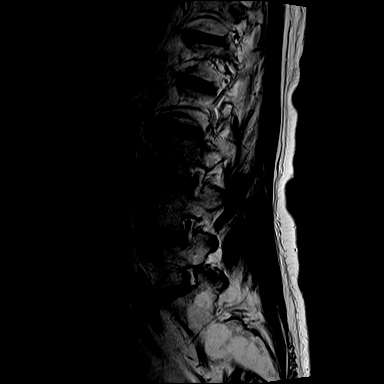
[im 17/17]
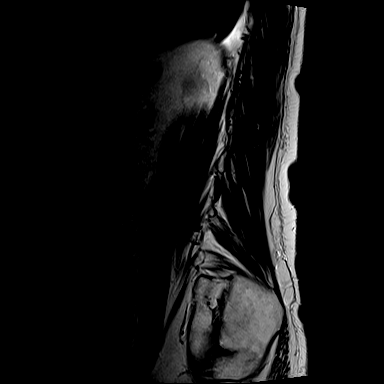

[Series 9: T1 · sagittal · 4.0mm · 0.88mm/px · 5 of 17 slices shown (1 of 2)]
[im 1/17]
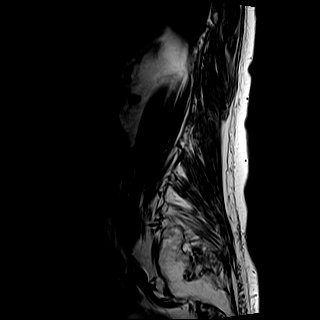
[im 5/17]
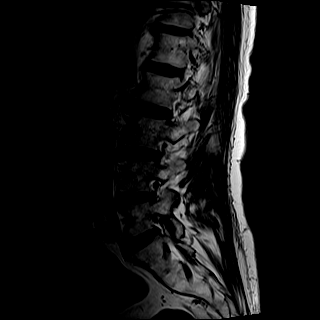
[im 9/17]
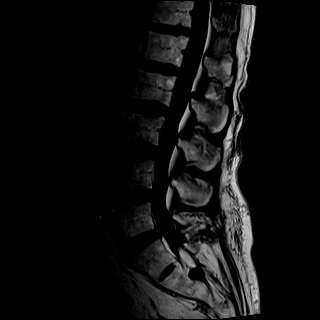
[im 13/17]
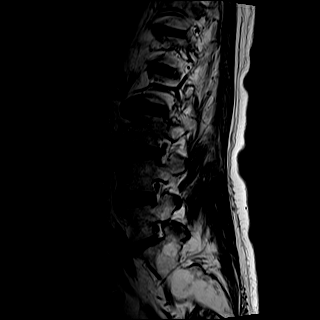
[im 17/17]
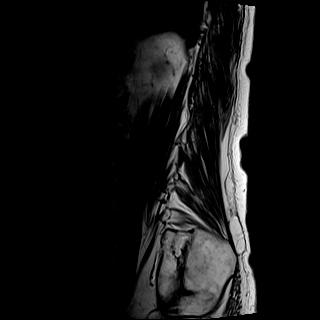

[Series 10: T2 · axial · 4.0mm · 0.57mm/px · z∈[-151,+44]mm · 9 of 32 slices shown (2 of 2)]
[im 1/32]
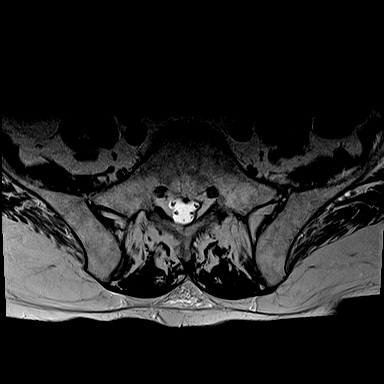
[im 4/32]
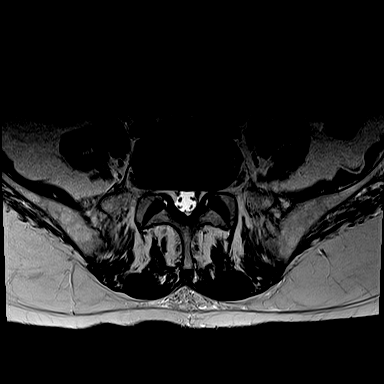
[im 8/32]
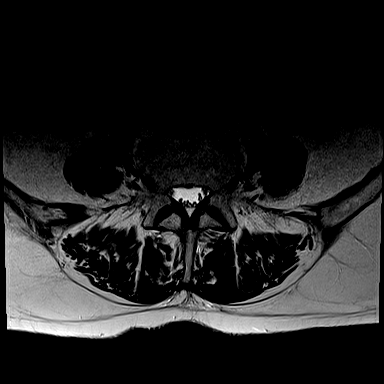
[im 12/32]
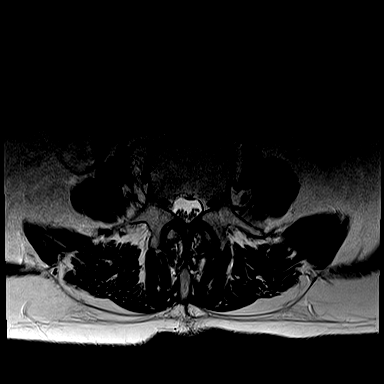
[im 16/32]
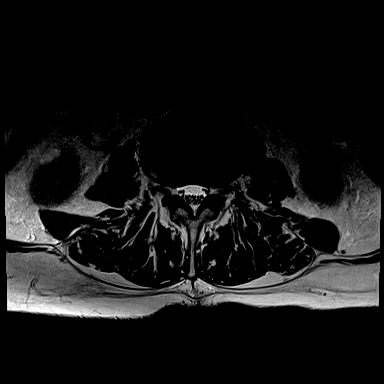
[im 20/32]
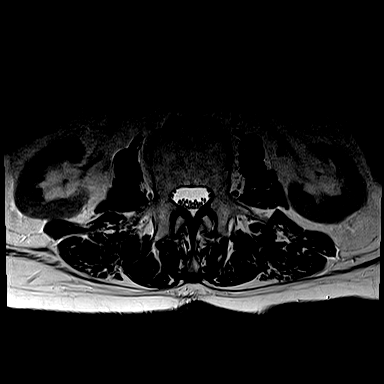
[im 24/32]
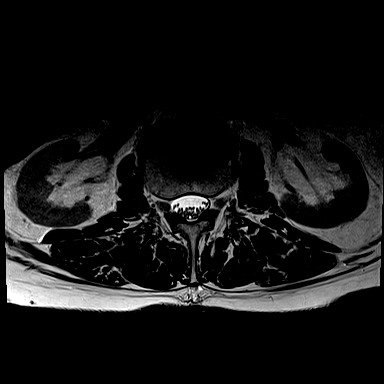
[im 28/32]
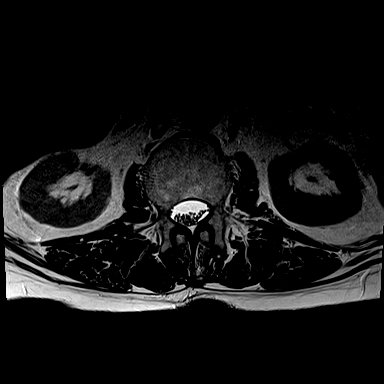
[im 32/32]
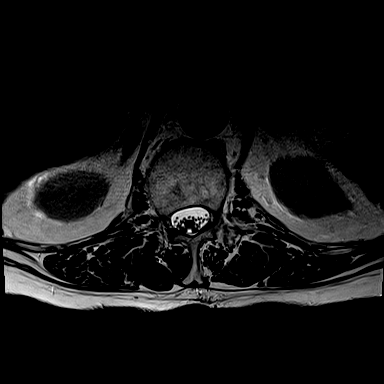

[Series 11: T1 · axial · 4.0mm · 0.34mm/px · z∈[-151,+24]mm · 6 of 32 slices shown (2 of 2)]
[im 1/32]
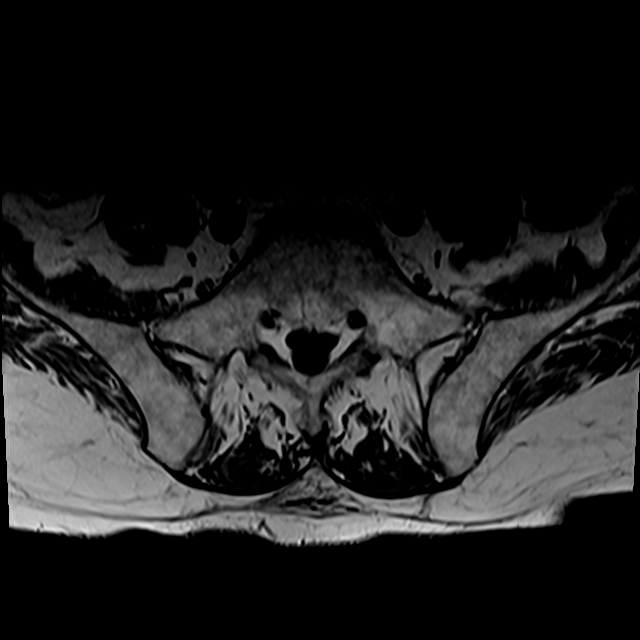
[im 4/32]
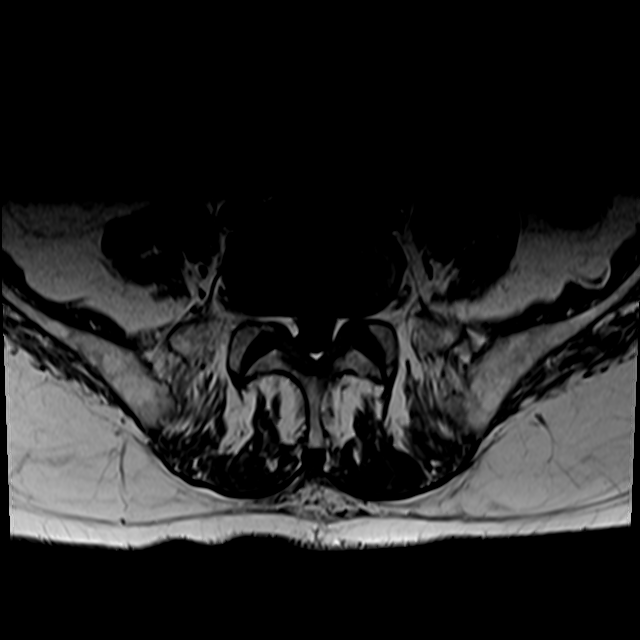
[im 8/32]
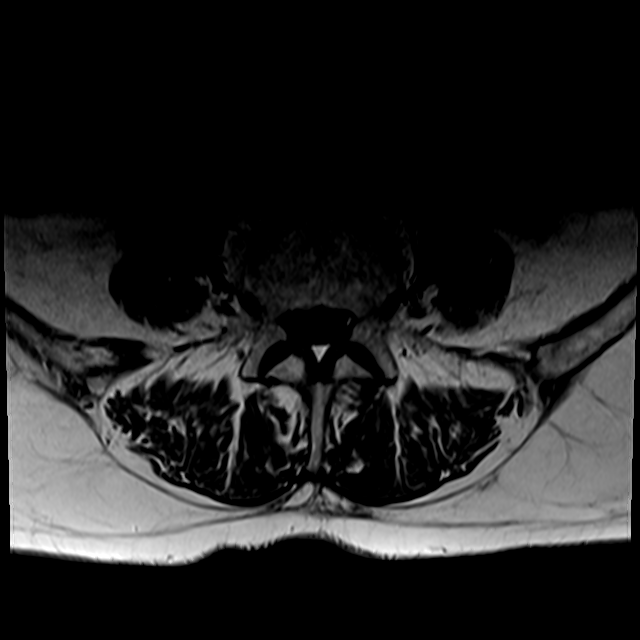
[im 12/32]
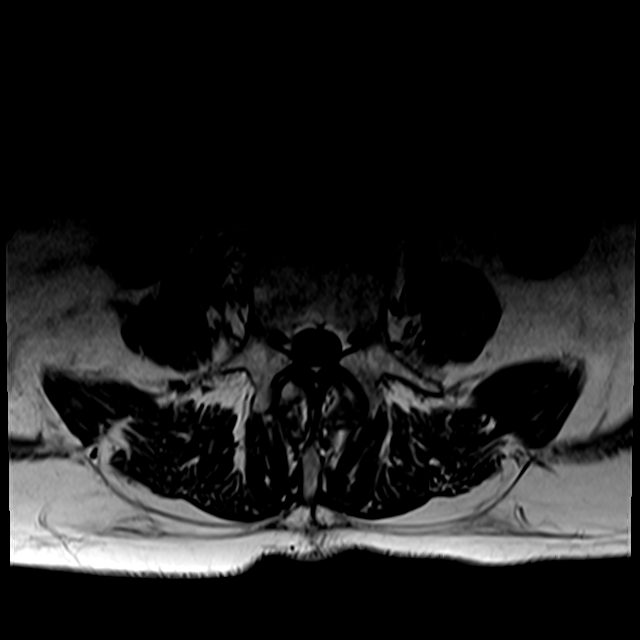
[im 16/32]
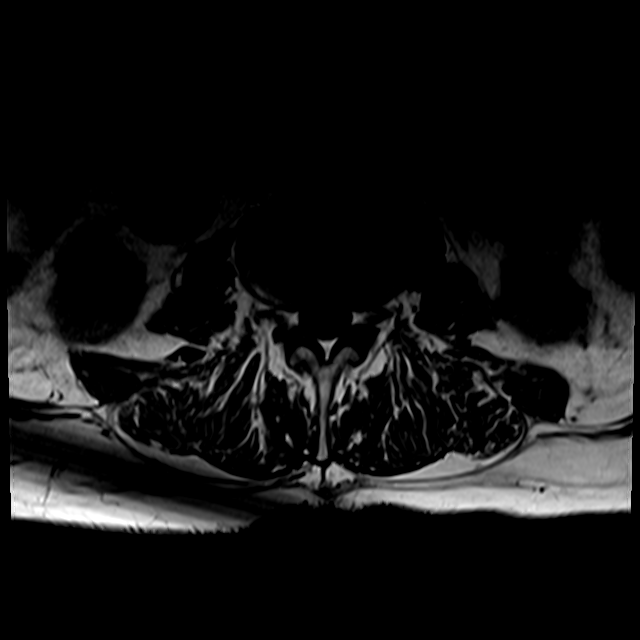
[im 28/32]
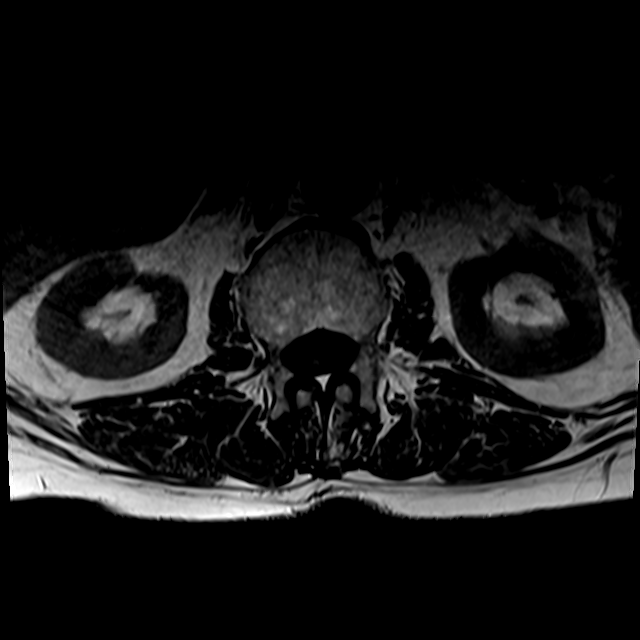

[26 of 48 positions shown; findings below may reference images not displayed]

FINDINGS: Segmentation:  5 lumbar type vertebrae.

Alignment:  Normal

Vertebrae: No bone infarct, fracture, evidence of discitis, or bone
lesion.

Conus medullaris and cauda equina: Conus extends to the L1-2 level.
Conus and cauda equina appear normal. There is no epidural
collection. Up lifting of left nerve roots at L1-2 on axial T2
weighted imaging is attributed to adjacent osteophyte. No suspected
intrathecal collection either. Small Tarlov cyst at S2 with mild
bony scalloping

Paraspinal and other soft tissues: Negative

Disc levels:

Mild disc narrowing and desiccation with bulge, age congruent.
Notable but mild left facet spurring at L1-2. No neural impingement
IMPRESSION: No explanation for weakness.  No epidural hematoma or conus infarct.

## 2019-04-20 MED ORDER — RIVAROXABAN 20 MG PO TABS
20.0000 mg | ORAL_TABLET | Freq: Every day | ORAL | Status: DC
  Administered 2019-04-20: 20 mg via ORAL
  Filled 2019-04-20: qty 1

## 2019-04-20 MED ORDER — TRAMADOL HCL 50 MG PO TABS: 50.0000 mg | ORAL_TABLET | Freq: Four times a day (QID) | ORAL | 1 refills | Status: DC | PRN

## 2019-04-20 MED ORDER — GADOBUTROL 1 MMOL/ML IV SOLN
7.5000 mL | Freq: Once | INTRAVENOUS | Status: AC | PRN
  Administered 2019-04-20: 7.5 mL via INTRAVENOUS

## 2019-04-20 NOTE — Discharge Summary (Signed)
Discharge Summary     Francisco [name] 03-17-1946 73 y.o. male  [id number]  Admission Date: 04/19/2019  Discharge Date: 04/20/2019  Physician: Elam Dutch, MD  Admission Diagnosis: PAD (peripheral artery disease) ([place]) [I73.9]  HPI:   This is a 73 y.o. male who returns for follow-up today.  He previously underwent right common iliac stenting December 11, 2018.  This was done for a nonhealing wound of the right foot.  Initially the foot healed.  Now he has had deterioration of the right third toe.  He also has some early symptoms of rest pain at nighttime.  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  Recent duplex scan showed that the right common iliac stent is patent.  ABI on the right side was 0.5.  ABI on the left was 0.97.  I reviewed the studies from March 22, 2019.  I also reviewed his arteriogram which showed subtotal occlusion of the mid to distal right common femoral artery with three-vessel runoff to the right foot.  He had inline flow to the left leg.  The patient has fairly significant pulmonary dysfunction.  He is on continuous home oxygen.  He has a dusky blue appearance to both feet chronically from hypoxia rather than arterial occlusive disease.  However, the ulcer on his right foot which has failed to heal certainly is related to his peripheral arterial disease in his right leg.  His pulmonologist is [name].  He states that today he has fairly tight chest symptoms from his COPD.  He thinks he needs a tune up of his COPD.  He is on Xarelto for prior pulmonary embolus.  Review of systems: He has shortness of breath at rest.  He has no chest pain  Hospital Course:  The patient was admitted to the hospital and taken to the operating room on 04/19/2019 and underwent: Right common femoral endarterectomy bovine patch angioplasty    Findings: Severe calcific subtotal occlusion right common femoral artery patch  extending from the distal external iliac artery to the proximal 2 cm of the superficial femoral artery  The pt tolerated the procedure well and was transported to the PACU in good condition.   By POD 1, his foley was removed and he was able to void. [name] Kitchen  He was on 3LO2NC, which is baseline.    The night of surgery, per Dr. Carlis Abbott, pt Reportedly had an L3-L4 spinal.  Patient did receive intraoperative heparin for his endarterectomy.  Anesthesia is concerned he has been slow to move his legs post procedure in recovery.  On my assessment he is able to bend both knees now which is an improvement over the last 15 minutes.  He is starting to feel sensation in his feet.  Nursing and recovery feels like this is an improvement.  Will allow him to go to the floor.  I have spoken with Dr. Nyoka Cowden with anesthesia who remains concerned about his exam.  After discussing with radiology they recommend MRI of the lumbar spine with and without contrast which has been ordered stat to rule out hematoma, etc.  On POD 1, he is back at baseline and MRI was negative.  He was doing well.  He was evaluated by PT/OT.  He was able to void and was discharged home.  The remainder of the hospital course consisted of increasing mobilization and increasing intake of solids without difficulty.  CBC    Component Value  Date/Time   WBC 8.2 04/20/2019 0229   RBC 3.39 (L) 04/20/2019 0229   HGB 11.2 (L) 04/20/2019 0229   HCT 32.6 (L) 04/20/2019 0229   PLT 194 04/20/2019 0229   MCV 96.2 04/20/2019 0229   MCH 33.0 04/20/2019 0229   MCHC 34.4 04/20/2019 0229   RDW 12.7 04/20/2019 0229   LYMPHSABS 0.4 (L) 04/07/2018 1146   MONOABS 0.3 04/07/2018 1146   EOSABS 0.0 04/07/2018 1146   BASOSABS 0.1 04/07/2018 1146    BMET    Component Value Date/Time   NA 137 04/20/2019 0229   K 4.3 04/20/2019 0229   CL 99 04/20/2019 0229   CO2 25 04/20/2019 0229   GLUCOSE 131 (H) 04/20/2019 0229   BUN 12 04/20/2019 0229   CREATININE 0.85  04/20/2019 0229   CALCIUM 8.3 (L) 04/20/2019 0229   GFRNONAA >60 04/20/2019 0229   GFRAA >60 04/20/2019 0229       Discharge Diagnosis:  PAD (peripheral artery disease) ([place]) [I73.9]  Secondary Diagnosis: Patient Active Problem List   Diagnosis Date Noted  . PAD (peripheral artery disease) ([place]) 12/11/2018  . Surgical counseling visit 12/10/2018  . COPD with acute exacerbation ([place]) 11/27/2018  . Stage 4 very severe COPD by GOLD classification ([place]) 11/27/2018  . DNR (do not resuscitate) 04/21/2018  . Diastolic dysfunction 10/62/6948  . Chronic pulmonary embolism ([place]) 04/07/2018  . Essential tremor 03/31/2018  . BPPV (benign paroxysmal positional vertigo), right 02/10/2018  . Osteoporosis 11/19/2017  . Syncope 11/02/2017  . Thoracic compression fracture ([place]) 11/02/2017  . Essential hypertension 11/02/2017  . Venous stasis dermatitis of both lower extremities 11/02/2017  . Chronic respiratory failure with hypoxia ([place]) 09/23/2017  . BPH associated with nocturia 06/25/2017  . Leukocytosis 12/19/2016  . Hyperglycemia 06/12/2016  . Hyperlipidemia 07/22/2014  . Onychomycosis 07/22/2014  . Left ankle swelling 07/22/2014  . History of skin cancer 05/17/2014  . Chronic rhinitis 10/28/2012  . Pulmonary nodule 09/23/2011  . Former smoker 08/23/2008  . COPD (chronic obstructive pulmonary disease) with emphysema ([place]) 08/23/2008  . History of colonic polyps 08/23/2008   Past Medical History:  Diagnosis Date  . Chest x-ray abnormality   . Chronic back pain    "mid and lower" (04/07/2018)  . Chronic rhinitis    -Sinus Ct 08/01/2009 >> Bilateral maxillary sinusitis with some mucosal thickeningin the sphenoid and frontal sinuses as well with air fluid levels present -chronic rhinitis flyer Aug 04, 2009  . Compressed spine fracture ([place])   . COPD (chronic obstructive pulmonary disease) [facility])    PFT's rec Jul 17, 2009  . Dyspnea   . Emphysema lung ([place])   . Emphysema of lung  ([place])   . On home oxygen therapy    "3L; 24/7" (04/07/2018)  . Onychomycosis    Dr. Judi Cong  . Orthostatic hypotension    "since 10/2017" (04/07/2018)  . PAD (peripheral artery disease) ([place])   . Pneumonia    "twice in 1 year" (04/07/2018)  . Pulmonary embolism ([place]) 04/07/2018  . Skin cancer    "lips, face, ears, arms" (04/07/2018)  . Vertigo    "since ~ 02/2018" (04/07/2018)     Allergies as of 04/20/2019      Reactions   Tape Other (See Comments)   SKIN IS VERY THIN; CAN ONLY USE COBAN WRAPS DUE TO CONDITION OF SKIN!!   Levaquin [levofloxacin]    hallucinations      Medication List    STOP taking these medications   amoxicillin-clavulanate 875-125  MG tablet Commonly known as: Augmentin     TAKE these medications   acetaminophen 500 MG tablet Commonly known as: TYLENOL Take 1,000 mg by mouth 2 (two) times daily.   albuterol 108 (90 Base) MCG/ACT inhaler Commonly known as: Ventolin HFA USE 2 PUFFS EVERY 6 HOURS  AS NEEDED FOR WHEEZING What changed:   how much to take  how to take this  when to take this  reasons to take this  additional instructions   budesonide-formoterol 160-4.5 MCG/ACT inhaler Commonly known as: Symbicort USE 2 PUFFS BY MOUTH TWO  TIMES DAILY What changed:   how much to take  how to take this  when to take this  additional instructions   CALCIUM 600+D PO Take 2 tablets by mouth daily.   Daliresp 500 MCG Tabs tablet Generic drug: roflumilast TAKE 1 TABLET BY MOUTH  DAILY What changed: how much to take   fluticasone 50 MCG/ACT nasal spray Commonly known as: FLONASE USE 2 SPRAYS IN EACH  NOSTRIL DAILY   furosemide 20 MG tablet Commonly known as: LASIX TAKE 1 TABLET (20 MG TOTAL) BY MOUTH DAILY AS NEEDED FOR FLUID OR EDEMA. What changed:   when to take this  additional instructions   OXYGEN Inhale 3 L into the lungs continuous.   predniSONE 10 MG tablet Commonly known as: DELTASONE Take 1 tablet (10 mg total) by mouth  daily with breakfast.   predniSONE 10 MG tablet Commonly known as: DELTASONE 4 tabs for 2 days, then 3 tabs for 2 days, 2 tabs for 2 days, then 1 tab for 2 days, then stop   Pulse Oximeter For Finger Misc 1 Device by Does not apply route as needed.   REDNESS RELIEVER EYE DROPS OP Place 1 drop into both eyes daily as needed (redness / itching).   Spiriva Respimat 2.5 MCG/ACT Aers Generic drug: Tiotropium Bromide Monohydrate USE 2 INHALATIONS BY MOUTH  ONCE DAILY What changed: See the new instructions.   traMADol 50 MG tablet Commonly known as: ULTRAM Take 1 tablet (50 mg total) by mouth every 6 (six) hours as needed for moderate pain or severe pain.   Xarelto 20 MG Tabs tablet Generic drug: rivaroxaban TAKE 1 TABLET BY MOUTH  DAILY WITH SUPPER What changed: See the new instructions.       Discharge Instructions: [facility] Discharge instructions Lower Extremity Bypass Surgery  Please refer to the following instruction for your post-procedure care. Your surgeon or physician assistant will discuss any changes with you.  Activity  You are encouraged to walk as much as you can. You can slowly return to normal activities during the month after your surgery. Avoid strenuous activity and heavy lifting until your doctor tells you it's OK. Avoid activities such as vacuuming or swinging a golf club. Do not drive until your doctor give the OK and you are no longer taking prescription pain medications. It is also normal to have difficulty with sleep habits, eating and bowel movement after surgery. These will go away with time.  Bathing/Showering  You may shower after you go home. Do not soak in a bathtub, hot tub, or swim until the incision heals completely.  Incision Care  Clean your incision with mild soap and water. Shower every day. Pat the area dry with a clean towel. You do not need a bandage unless otherwise instructed. Do not apply any  ointments or creams to your incision. If you have open wounds you will be  instructed how to care for them or a visiting nurse may be arranged for you. If you have staples or sutures along your incision they will be removed at your post-op appointment. You may have skin glue on your incision. Do not peel it off. It will come off on its own in about one week.  Wash the groin wound with soap and water daily and pat dry. (No tub bath-only shower)  Then put a dry gauze or washcloth in the groin to keep this area dry to help prevent wound infection.  Do this daily and as needed.  Do not use Vaseline or neosporin on your incisions.  Only use soap and water on your incisions and then protect and keep dry.  Diet  Resume your normal diet. There are no special food restrictions following this procedure. A low fat/ low cholesterol diet is recommended for all patients with vascular disease. In order to heal from your surgery, it is CRITICAL to get adequate nutrition. Your body requires vitamins, minerals, and protein. Vegetables are the best source of vitamins and minerals. Vegetables also provide the perfect balance of protein. Processed food has little nutritional value, so try to avoid this.  Medications  Resume taking all your medications unless your doctor or Physician Assistant tells you not to. If your incision is causing pain, you may take over-the-counter pain relievers such as acetaminophen (Tylenol). If you were prescribed a stronger pain medication, please aware these medication can cause nausea and constipation. Prevent nausea by taking the medication with a snack or meal. Avoid constipation by drinking plenty of fluids and eating foods with high amount of fiber, such as fruits, vegetables, and grains. Take Colace 100 mg (an over-the-counter stool softener) twice a day as needed for constipation.  Do not take Tylenol if you are taking prescription pain medications.  Follow Up  Our office will schedule  a follow up appointment 2-3 weeks following discharge.  Please call us immediately for any of the following conditions  .Severe or worsening pain in your legs or feet while at rest or while walking .Increase pain, redness, warmth, or drainage (pus) from your incision site(s) . Fever of 101 degree or higher . The swelling in your leg with the bypass suddenly worsens and becomes more painful than when you were in the hospital . If you have been instructed to feel your graft pulse then you should do so every day. If you can no longer feel this pulse, call the office immediately. Not all patients are given this instruction. .  Leg swelling is common after leg bypass surgery.  The swelling should improve over a few months following surgery. To improve the swelling, you may elevate your legs above the level of your heart while you are sitting or resting. Your surgeon or physician assistant may ask you to apply an ACE wrap or wear compression (TED) stockings to help to reduce swelling.  Reduce your risk of vascular disease  Stop smoking. If you would like help call [facility] at 1-800-QUIT-NOW (313)010-2030) or [facility] at 8731016340.  . Manage your cholesterol . Maintain a desired weight . Control your diabetes weight . Control your diabetes . Keep your blood pressure down .  If you have any questions, please call the office at [phone]   Prescriptions given: 1.  Tramadol #20 No Refill  Disposition: home  Patient's condition: is Good  Follow up: 1. [name] in 2 weeks   Leontine Locket, PA-C [facility]  [phone] 04/20/2019  7:22 AM  - For VQI Registry use ---   Post-op:  Wound infection: No  Graft infection: No  Transfusion: No    If yes, n/a units given New Arrhythmia: No Ipsilateral amputation: No, [ ]  Minor, [ ]  BKA, [ ]  AKA Discharge patency: [x ] Primary, [ ]  Primary assisted, [ ]  Secondary, [ ]  Occluded Patency judged by: [x ]  Dopper only, [ ]  Palpable graft pulse, []  Palpable distal pulse, [ ]  ABI inc. > 0.15, [ ]  Duplex Discharge ABI: R not done, L not done   Complications: MI: No, [ ]  Troponin only, [ ]  EKG or Clinical CHF: No Resp failure:No, [ ]  Pneumonia, [ ]  Ventilator Chg in renal function: No, [ ]  Inc. Cr > 0.5, [ ]  Temp. Dialysis,  [ ]  Permanent dialysis Stroke: No, [ ]  Minor, [ ]  Major Return to OR: No  Reason for return to OR: [ ]  Bleeding, [ ]  Infection, [ ]  Thrombosis, [ ]  Revision  Discharge medications: Statin use:  yes ASA use:  no Plavix use:  no Beta blocker use: no CCB use:  No ACEI use:   no ARB use:  no Coumadin use: no

## 2019-04-20 NOTE — Progress Notes (Signed)
Pt brought his symbicort and spiriva respimat inhalers from home.  Pt advised me that his lung Dr advised him to only take these specific inhalers because they work better for him than the dulera and incruse.  I did witness pt taking both meds.

## 2019-04-20 NOTE — Evaluation (Signed)
Physical Therapy Evaluation Patient Details Name: Francisco [name] MRN: [id number] DOB: [birth year] Today's Date: 04/20/2019   History of Present Illness  Pt is a 73 y/o male s/p R common femoral endarterectomy bovine patch angioplasty 04/19/19. PMH: COPD, compressed spine fx, PAD< pneumonia, vertigo, plumonary embolism.   Clinical Impression  Patient presents with dyspnea on exertion, decreased activity tolerance/endurance and impaired mobility s/p above. Pt lives with wife and uses RW as needed for ambulation, does his own ADLs. Today, pt tolerated transfers and gait training with Min guard-supervision for safety. HR up to 142 bpm and Sp02 remained >94% on 3L/min 02. Requires frequent rest breaks during activities which pt reports as baseline. Demonstrates good pursed lip breathing. Has support of spouse at home. Will follow acutely to maximize independence and mobility prior to return home.    Follow Up Recommendations Supervision for mobility/OOB;No PT follow up    Equipment Recommendations  None recommended by PT    Recommendations for Other Services       Precautions / Restrictions Precautions Precautions: Fall Precaution Comments: watch HR Restrictions Weight Bearing Restrictions: No      Mobility  Bed Mobility Overal bed mobility: Modified Independent             General bed mobility comments: HOB elevated, but no assist required  Transfers Overall transfer level: Needs assistance Equipment used: Rolling walker (2 wheeled) Transfers: Sit to/from Stand Sit to Stand: Min guard;Supervision         General transfer comment: min guard to close supervision for safety   Ambulation/Gait Ambulation/Gait assistance: Min guard Gait Distance (Feet): 28 Feet Assistive device: Rolling walker (2 wheeled) Gait Pattern/deviations: Step-through pattern;Decreased stride length;Trunk flexed Gait velocity: decreased   General Gait Details: Slow, mostly steady gait with RW  for support; multiple rest breaks due to SOB and 2-3/4 DOE. HR up to 142 bpm.Sp02 remained >94% on 3L/min 02.  Stairs            Wheelchair Mobility    Modified Rankin (Stroke Patients Only)       Balance Overall balance assessment: Needs assistance Sitting-balance support: No upper extremity supported;Feet supported Sitting balance-Leahy Scale: Good     Standing balance support: Bilateral upper extremity supported;No upper extremity supported;During functional activity Standing balance-Leahy Scale: Fair Standing balance comment: able to stand statically without UE support during grooming tasks but reliant on BUE support dynamically                             Pertinent Vitals/Pain Pain Assessment: No/denies pain    Home Living Family/patient expects to be discharged to:: Private residence Living Arrangements: Spouse/significant other Available Help at Discharge: Family;Available 24 hours/day Type of Home: House Home Access: Stairs to enter Entrance Stairs-Rails: None Entrance Stairs-Number of Steps: 2 Home Layout: One level Home Equipment: Walker - 2 wheels;Shower seat;Bedside commode;Walker - 4 wheels      Prior Function Level of Independence: Independent with assistive device(s)         Comments: Walks short distances, uses RW as needed. Wears 3L/min 02 at home. independent ADLs     Hand Dominance   Dominant Hand: Right    Extremity/Trunk Assessment   Upper Extremity Assessment Upper Extremity Assessment: Defer to OT evaluation    Lower Extremity Assessment Lower Extremity Assessment: RLE deficits/detail;Generalized weakness RLE Sensation: decreased light touch(foot)    Cervical / Trunk Assessment Cervical / Trunk Assessment: Kyphotic  Communication  Communication: No difficulties  Cognition Arousal/Alertness: Awake/alert Behavior During Therapy: WFL for tasks assessed/performed Overall Cognitive Status: Within Functional Limits  for tasks assessed                                        General Comments General comments (skin integrity, edema, etc.): HR up to 142 bpm with activity, Sp02 >94% on 3L    Exercises     Assessment/Plan    PT Assessment Patient needs continued PT services  PT Problem List Decreased mobility;Impaired sensation;Decreased activity tolerance;Cardiopulmonary status limiting activity;Decreased skin integrity       PT Treatment Interventions Therapeutic activities;Gait training;Therapeutic exercise;Patient/family education;Balance training;Stair training;Functional mobility training    PT Goals (Current goals can be found in the Care Plan section)  Acute Rehab PT Goals Patient Stated Goal: home today PT Goal Formulation: With patient Time For Goal Achievement: 05/04/19 Potential to Achieve Goals: Good    Frequency Min 3X/week   Barriers to discharge        Co-evaluation   Reason for Co-Treatment: To address functional/ADL transfers;For patient/therapist safety   OT goals addressed during session: ADL's and self-care       AM-PAC PT "6 Clicks" Mobility  Outcome Measure Help needed turning from your back to your side while in a flat bed without using bedrails?: A Little Help needed moving from lying on your back to sitting on the side of a flat bed without using bedrails?: A Little Help needed moving to and from a bed to a chair (including a wheelchair)?: A Little Help needed standing up from a chair using your arms (e.g., wheelchair or bedside chair)?: A Little Help needed to walk in hospital room?: A Little Help needed climbing 3-5 steps with a railing? : A Little 6 Click Score: 18    End of Session Equipment Utilized During Treatment: Gait belt;Oxygen Activity Tolerance: Patient tolerated treatment well;Treatment limited secondary to medical complications (Comment)(HR) Patient left: in chair;with call bell/phone within reach Nurse Communication:  Mobility status PT Visit Diagnosis: Difficulty in walking, not elsewhere classified (R26.2)    Time: 4287-6811 PT Time Calculation (min) (ACUTE ONLY): 22 min   Charges:   PT Evaluation $PT Eval Moderate Complexity: 1 Mod          Wray Kearns, PT, DPT Acute Rehabilitation Services Pager [phone] Office [place] 04/20/2019, 11:38 AM

## 2019-04-20 NOTE — Progress Notes (Signed)
Bladder scan  Complete: 400 ml noted. Leontine Locket PA notified. Order to give couple more hours to see if pt will urinate. Will continue to monitor.

## 2019-04-20 NOTE — Progress Notes (Addendum)
  Progress Note    04/20/2019 7:14 AM 1 Day Post-Op  Subjective:  Says his right leg is great this morning.  No pain at the groin site.   Afebrile HR 80's-100's NSR 657'Q-469'G systolic 29% 5MW4XL   Vitals:   04/20/19 0102 04/20/19 0537  BP: 121/67 (!) 130/97  Pulse: 99 99  Resp: 17 16  Temp: 97.6 F (36.4 C) 97.8 F (36.6 C)  SpO2: 98% 98%    Physical Exam: Cardiac:  regular Lungs:  Non labored Incisions:  Clean and dry without hematoma Extremities:  Brisk doppler signals right foot   CBC    Component Value Date/Time   WBC 8.2 04/20/2019 0229   RBC 3.39 (L) 04/20/2019 0229   HGB 11.2 (L) 04/20/2019 0229   HCT 32.6 (L) 04/20/2019 0229   PLT 194 04/20/2019 0229   MCV 96.2 04/20/2019 0229   MCH 33.0 04/20/2019 0229   MCHC 34.4 04/20/2019 0229   RDW 12.7 04/20/2019 0229   LYMPHSABS 0.4 (L) 04/07/2018 1146   MONOABS 0.3 04/07/2018 1146   EOSABS 0.0 04/07/2018 1146   BASOSABS 0.1 04/07/2018 1146    BMET    Component Value Date/Time   NA 137 04/20/2019 0229   K 4.3 04/20/2019 0229   CL 99 04/20/2019 0229   CO2 25 04/20/2019 0229   GLUCOSE 131 (H) 04/20/2019 0229   BUN 12 04/20/2019 0229   CREATININE 0.85 04/20/2019 0229   CALCIUM 8.3 (L) 04/20/2019 0229   GFRNONAA >60 04/20/2019 0229   GFRAA >60 04/20/2019 0229    INR    Component Value Date/Time   INR 1.0 04/19/2019 1049     Intake/Output Summary (Last 24 hours) at 04/20/2019 0714 Last data filed at 04/20/2019 0658 Gross per 24 hour  Intake 1450 ml  Output 1950 ml  Net -500 ml     Assessment:  73 y.o. male is s/p:  Right common femoral endarterectomy bovine patch angioplasty  1 Day Post-Op  Plan: -pt with brisk doppler signals right foot-right foot feeling much better.  Able to move both legs and MRI was negative after pt was slow to move legs post procedure.  He is back at baseline this am.   -pt on 3LO2NC pre-op and is at baseline -foley out this am-once pt is able to void and can  transfer and pivot, he can  Discharge home and f/u with [name] in a couple of weeks.  -restart [place], PA-C [facility] [phone] 04/20/2019 7:14 AM  I have seen and evaluated the patient. I agree with the PA note as documented above.  Postop day 1 status post right femoral endarterectomy with bovine patch.  Groin looks good this morning.  Brisk right DP and PT signals.  States his foot feels much better.  There was concern about movement of his lower extremities in recovery last night after spinal.  He did get MRI lumbar spine that shows no evidence of hematoma etc.  Lower extremities are doing much better today and appear motor and sensory intact.  He is on 3 L nasal cannula which is baseline home O2 requirement.  He is ready for discharge and will arrange follow-up with Dr. fields in 2 to 3 weeks for wound check.  Marty Heck, MD [facility] [place] Office: [phone] Pager: [phone]

## 2019-04-20 NOTE — Evaluation (Addendum)
Occupational Therapy Evaluation Patient Details Name: Francisco [name] MRN: [id number] DOB: Nov 29, 1945 Today's Date: 04/20/2019    History of Present Illness Pt is a 73 y/o male s/p R common femoral endarterectomy bovine patch angioplasty 04/19/19. PMH: COPD, compressed spine fx, PAD, pneumonia, vertigo, plumonary embolism.    Clinical Impression   PTA patient independent using RW as needed, independent ADLs.  Admitted for above and limited by problem list below, including impaired balance, decreased functional reach to LEs, and decreased activity tolerance.  Patient requires min guard to close supervision for transfers and in room mobility using RW, min assist for LB ADLs and setup assist for UB ADLs.  Noted HR increased to 141 with activity, on 3L supplemental O2 (baseline). He has support of spouse at dc as needed.  He will benefit from continued OT services while admitted in order to maximize independence and safety with ADLs, but anticipate no further needs after dc.    Follow Up Recommendations  No OT follow up;Supervision - Intermittent    Equipment Recommendations  None recommended by OT    Recommendations for Other Services       Precautions / Restrictions Precautions Precautions: Fall Restrictions Weight Bearing Restrictions: No      Mobility Bed Mobility Overal bed mobility: Modified Independent             General bed mobility comments: HOB elevated, but no assist required  Transfers Overall transfer level: Needs assistance Equipment used: Rolling walker (2 wheeled) Transfers: Sit to/from Stand Sit to Stand: Min guard;Supervision         General transfer comment: min guard to close supervision for safety     Balance Overall balance assessment: Needs assistance Sitting-balance support: No upper extremity supported;Feet supported Sitting balance-Leahy Scale: Good     Standing balance support: Bilateral upper extremity supported;No upper extremity  supported;During functional activity Standing balance-Leahy Scale: Fair Standing balance comment: able to stand statically without UE support during grooming tasks but reliant on BUE support dynamically                           ADL either performed or assessed with clinical judgement   ADL Overall ADL's : Needs assistance/impaired     Grooming: Min guard;Supervision/safety;Standing   Upper Body Bathing: Set up;Sitting   Lower Body Bathing: Minimal assistance;Sit to/from stand Lower Body Bathing Details (indicate cue type and reason): decreased functional reach to B feet, min guard to close supervision sit to stand   Upper Body Dressing : Set up;Sitting   Lower Body Dressing: Minimal assistance;Sit to/from stand Lower Body Dressing Details (indicate cue type and reason): decreased functional reach to B feet, min guard to close supervision sit to stand   Toilet Transfer: Supervision/safety;Min guard;Ambulation;RW           Functional mobility during ADLs: Min guard;Supervision/safety;Rolling walker General ADL Comments: pt limited by impaired balance and decreased activity tolerance     Vision         Perception     Praxis      Pertinent Vitals/Pain Pain Assessment: No/denies pain     Hand Dominance Right   Extremity/Trunk Assessment Upper Extremity Assessment Upper Extremity Assessment: Overall WFL for tasks assessed   Lower Extremity Assessment Lower Extremity Assessment: Defer to PT evaluation       Communication Communication Communication: No difficulties   Cognition Arousal/Alertness: Awake/alert Behavior During Therapy: WFL for tasks assessed/performed Overall Cognitive Status: Within Functional  Limits for tasks assessed                                     General Comments  noted HR upto 141 with activity, O2 on home 3L sustained 94 or greater      Exercises     Shoulder Instructions      Home Living Family/patient  expects to be discharged to:: Private residence Living Arrangements: Spouse/significant other Available Help at Discharge: Family;Available 24 hours/day Type of Home: House Home Access: Stairs to enter [employer] of Steps: 2 Entrance Stairs-Rails: None Home Layout: One level     Bathroom Shower/Tub: Tub/shower unit;Walk-in shower   Bathroom Toilet: Standard     Home Equipment: Environmental consultant - 2 wheels;Shower seat;Bedside commode;Walker - 4 wheels          Prior Functioning/Environment Level of Independence: Independent with assistive device(s)        Comments: Walks short distances, uses RW as needed. Wears 3L/min 02 at home. independent ADLs        OT Problem List: Decreased strength;Decreased activity tolerance;Impaired balance (sitting and/or standing);Cardiopulmonary status limiting activity      OT Treatment/Interventions: Self-care/ADL training;Energy conservation;DME and/or AE instruction;Balance training;Therapeutic activities;Patient/family education    OT Goals(Current goals can be found in the care plan section) Acute Rehab OT Goals Patient Stated Goal: home today OT Goal Formulation: With patient Time For Goal Achievement: 05/04/19 Potential to Achieve Goals: Good ADL Goals Pt Will Perform Grooming: with modified independence;standing Pt Will Perform Lower Body Dressing: with modified independence;sit to/from stand;with adaptive equipment;with caregiver independent in assisting Pt Will Transfer to Toilet: with modified independence;ambulating Pt Will Perform Tub/Shower Transfer: Shower transfer;Tub transfer;rolling walker;shower seat;with modified independence Additional ADL Goal #1: Pt will utilize energy conservation techniques during ADL routine in order to maximize independence and safety with ADLs.  OT Frequency: Min 2X/week   Barriers to D/C:            Co-evaluation PT/OT/SLP Co-Evaluation/Treatment: Yes Reason for Co-Treatment: To  address functional/ADL transfers;For patient/therapist safety   OT goals addressed during session: ADL's and self-care      AM-PAC OT "6 Clicks" Daily Activity     Outcome Measure Help from another person eating meals?: None Help from another person taking care of personal grooming?: A Little Help from another person toileting, which includes using toliet, bedpan, or urinal?: A Little Help from another person bathing (including washing, rinsing, drying)?: A Little Help from another person to put on and taking off regular upper body clothing?: None Help from another person to put on and taking off regular lower body clothing?: A Little 6 Click Score: 20   End of Session Equipment Utilized During Treatment: Rolling walker;Oxygen Nurse Communication: Mobility status  Activity Tolerance: Patient tolerated treatment well Patient left: in chair;with call bell/phone within reach  OT Visit Diagnosis: Unsteadiness on feet (R26.81);Other (comment)(decreased activity tolerance)                Time: 7867-6720 OT Time Calculation (min): 21 min Charges:  OT General Charges $OT Visit: 1 Visit OT Evaluation $OT Eval Moderate Complexity: [place], OT Acute Rehabilitation Services Pager [phone] Office [phone]   Delight Stare 04/20/2019, 11:20 AM

## 2019-04-20 NOTE — Anesthesia Postprocedure Evaluation (Signed)
Anesthesia Post Note  Patient: Francisco [name]  Procedure(s) Performed: ENDARTERECTOMY RIGHT COMMON FEMORAL (Right Groin) Patch Angioplasty (Right Groin)     Patient location during evaluation: PACU Anesthesia Type: Spinal Level of consciousness: awake and alert Pain management: pain level controlled Vital Signs Assessment: post-procedure vital signs reviewed and stable Respiratory status: spontaneous breathing Cardiovascular status: stable Anesthetic complications: no Comments: Apparently some delay in LE sensation and movement in PACU. MRI obtained. No epidural hematoma.    Last Vitals:  Vitals:   04/20/19 1000 04/20/19 1100  BP:  (!) 151/83  Pulse:    Resp: (!) 21 17  Temp:  (!) 36.3 C  SpO2:      Last Pain:  Vitals:   04/20/19 1100  TempSrc:   PainSc: 0-No pain                 Nolon Nations

## 2019-04-21 ENCOUNTER — Ambulatory Visit: Payer: Medicare Other | Admitting: Internal Medicine

## 2019-04-29 ENCOUNTER — Telehealth: Payer: Self-pay | Admitting: Pulmonary Disease

## 2019-04-29 DIAGNOSIS — J449 Chronic obstructive pulmonary disease, unspecified: Secondary | ICD-10-CM

## 2019-04-29 MED ORDER — AZITHROMYCIN 250 MG PO TABS: 250.0000 mg | ORAL_TABLET | ORAL | 3 refills | Status: DC

## 2019-04-29 MED ORDER — AZITHROMYCIN 250 MG PO TABS: 250.0000 mg | ORAL_TABLET | ORAL | 1 refills | Status: DC

## 2019-04-29 NOTE — Telephone Encounter (Signed)
04/29/2019 0942  Please contact the patient and let him know that I have heard back from [name].  [name] is agreeable to having you resume Monday Wednesday Friday azithromycin to help with your COPD management as well as limiting your COPD exacerbations.  I have gone ahead and sent a prescription for azithromycin Monday Wednesday Friday to your local pharmacy.  If you would like for [place] to also send a prescription to your mail order pharmacy that is okay we can do that.  We will need to intermittently monitor your EKG over the coming months as azithromycin can sometimes cause heart rhythm changes.  [name], FNP

## 2019-04-29 NOTE — Telephone Encounter (Signed)
Spoke with patient's wife, Katy Apo. Julian Reil of [name]'s recommendations, she verbalized understanding. She has requested that the local RX for the azithromycin to canceled and sent to the mail order pharmacy. Advised her that I would take care of this for them.   Also advised her to call us back if they had any questions, she verbalized understanding.   RX has been canceled at [facility] on [address].  New RX has been sent to [facility].   Nothing further needed at time of call.

## 2019-05-04 ENCOUNTER — Encounter: Payer: Self-pay | Admitting: Internal Medicine

## 2019-05-04 ENCOUNTER — Other Ambulatory Visit: Payer: Self-pay

## 2019-05-04 ENCOUNTER — Ambulatory Visit (INDEPENDENT_AMBULATORY_CARE_PROVIDER_SITE_OTHER): Payer: Medicare Other | Admitting: Internal Medicine

## 2019-05-04 VITALS — BP 104/66 | HR 115 | Temp 97.3°F | Ht 72.0 in | Wt 166.0 lb

## 2019-05-04 DIAGNOSIS — J9611 Chronic respiratory failure with hypoxia: Secondary | ICD-10-CM

## 2019-05-04 DIAGNOSIS — J449 Chronic obstructive pulmonary disease, unspecified: Secondary | ICD-10-CM | POA: Diagnosis not present

## 2019-05-04 NOTE — Progress Notes (Signed)
ROV 12/30/16 -- patient has a history of tobacco use, COPD currently maintained on chronic prednisone, Symbicort, Spiriva. Most recent blurry function testing was March 2011 with an FEV1 of 1.2 L (34% Pred), severe obstruction. He up titrates his prednisone on his own based on symptoms. No abx since last time. He has increased pred temporrily about 4 -5 x since last time. No increases for over a month. He is smoking a pipe, not cigarettes. He is using HCTZ more frequently these days. He is on flonase and singulair. He does note that he has some slow progression of his DOE. He coughs a few times a day, clears clear sputum.   rov 07/17/17 --this is a follow-up visit for patient with active tobacco use, severe COPD and chronic prednisone use.  He often titrates his prednisone on his own depending on how he is feeling. He complains of a new pain in his back below his left shoulder blade, has been present for the last 10 days. sometimes pleuritic, can be stabbing, worse w cough. His pred use: on 10mg  for the last 19 days. No hx VTE. He has LE edema, venous stasis changes, increased since his diuretics decreased.   ROV 09/23/17 --73 year old man with a history of tobacco use (still smokes pipe), severe COPD and severe obstruction on spirometry.  At his last visit he had some pleuritic left back pain and I performed a CT PA that I have reviewed from 07/17/17.  There was no pulmonary embolism but extensive emphysema present, some bilateral apical scar more so on the right, stable.  He uses prednisone 10 mg daily and uptitrate depending on his day-to-day symptoms.  He is otherwise managed on Symbicort and Spiriva.  Desaturated on arrival today after ambulating to the exam room. He reports more dyspnea, has increased pred intermittently.    OV 12/05/2017  Chief Complaint  Patient presents with   Follow-up    Switching from RB to MR.   Pt is on 3L with exertion and 2L at rest. Pt states his breathing had  become worse but since he has been on O2 since 3/21, it has really helped with his breathing.  Pt was in hospital 4/28-5/3. Other than SOB, pt does have c/o cough, rattling in chest. Denies any CP/chest tightness.   History from patient, his wife and review of the old chart  Talmadge Coventry IIIs a transfer of care from [name] to myself [name].  His son is my patient and therefore he is done this transfer of care.  According to the patient he is to be seen by [name] for COPD.  Patient is FEV1 34% based on March 2011 PFTs according to chart review.  At baseline he is maintained on Spiriva, Symbicort, Singulair, chronic daily prednisone 10 mg/day for at least 3 years, and 3 times a week of azithromycin for 2 years.  Earlier this year in March 2019 he went on daytime oxygen and following a recent hospitalization April-May 2019.  For syncope that was believed due to nocturnal hypoxemia he was started on night oxygen.  Since starting oxygen his edema has improved and his overall quality of life is improved and he stopped having any orthostasis or presyncopal episodes.  He is grateful for the fact that he is on oxygen.  His current COPD CAT score and severity of symptoms is rated below and is 26.  Show significant amount of symptom burden.  His main goal is to improve  his quality of life.  He had his wife have many questions centering around quality of life, medication therapy.  Of note he has had some thoracic vertebral fractures that are new.  This happened after the fall in late April 2019.  Discovered at admission last month.  He feels is due to prednisone.  He is wondering about portable oxygen.   OV 01/29/2018  Chief Complaint  Patient presents with   Follow-up    Pt states he has been doing okay since last visit. States breathing is about the same, has an occ cough but states the O2 has helped out a lot with breathing and cough. Denies any complaints of CP.   Lesia Sago II , 73 y.o. ,  with dob Dec 20, 1945 and male ,Not Hispanic or Latino from [place]  [id number] - presents to lung clinic for advanced COPD follow-up. Since his last visit his symptoms course of improvement. Score is 26 and severe symptoms of document below. He is on Spiriva, Symbicort, continues oxygen, daily prednisone and azithromycin 3 times a week. He has stopped his singulair without any problems. Overall he says he is better. He is willing to give TRELEGY inhaler trial. Last visit to check blood gas and he does not have hypercapnia so he does not qualify for BiPAP        OV 04/03/2018  Subjective:  Patient ID: Francisco [name], male , DOB: March 25, 1946 , age 77 y.o. , MRN: [id number] , ADDRESS: [place] [address]   04/03/2018 -   Chief Complaint  Patient presents with   Acute Visit    Pt's O2 sats have been dropping into the 70s with little exertion.  Pt took O2 off just to shave 9/26 and after he finished shaving put the O2 back on, walked from the bathroom down to the living room on O2 and sats were at 77% on 3L 9/26. Pt also has c/o cough with white to yellow mucus and chest tightness with the SOB.     HPI ENRICO EADDY II 73 y.o. -acute visit for this patient.  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  He is chronically prednisone dependent.  He also is on schedule azithromycin.  He called in yesterday feeling unwell therefore we asked him to come in today.  He tells me that approximately a week ago he started having increased cough and congestion.  Yesterday primary care physician following a routine visit thought he was in COPD exacerbation and started him on doxycycline.  He also personally bumped up his baseline prednisone.  He does me that yesterday while he was shaving on room air he felt more short of breath than usual.  Also when he walked he desaturated more than usual therefore he decided to call in and come in today.   There is no fever or chills or hemoptysis or colored sputum.  No leg edema.  No orthopnea.  He does not feel like he needs to be in the emergency department or get admitted.   OV 05/11/2018  Subjective:  Patient ID: Francisco [name], male , DOB: 26-Apr-1946 , age 60 y.o. , MRN: [id number] , ADDRESS: [place]  [id number]   05/11/2018 -   Chief Complaint  Patient presents with   Follow-up    pt states breathing has wrosen since last OV. increased sob, occ chest tightness, prod cough with yellow mucus &  chest congestion. currently taking trelegy samples. recent admission 04/07/18 for PE.     HPI Lesia Sago II 73 y.o. -advanced COPD with chronic hypoxemic respiratory failure [not a BiPAP candidate because of lack of hypercapnia] presents with his wife for follow-up.  After the last visit he continued to have symptoms so on April 07, 2018 he got an outpatient CT scan and he had pulmonary embolism.  He got admitted to the hospital and then discharged and since then has been on Xarelto.  He continues his baseline 2-3 L of nasal cannula oxygen but he says since then he is more symptomatic.  Is also corresponds with him starting Trelegy inhaler and also for the last few to several weeks is having increased cough and congestion and yellow phlegm.  His COPD CAT score is deteriorated to 35 and above.  He is very frustrated by his symptoms.  Review of his medication shows that he takes azithromycin and chronic prednisone and Trelegy and oxygen for his COPD.  He is on Xarelto for his blood clots.  He is also on Fosamax for prednisone dependent osteoporosis management.  His smoking is in remission.  Wife says he is always sedentary.  Apparently they tried to do some physical therapy a month ago but he desaturated.  He does not seem to keen on physical therapy right now     OV 06/10/2018  Subjective:  Patient ID: Francisco [name], male , DOB: 22-Mar-1946 , age 30 y.o. , MRN: [id number] ,  ADDRESS: [place] [id number]   06/10/2018 -   Chief Complaint  Patient presents with   Follow-up    follows for COPD. patient has increased SOB with exertion     HPI Lesia Sago II 73 y.o. -returns for gold stage IV COPD follow-up with chronic hypoxemic respiratory failure.  I saw him approximately a month ago at which time recommended Trelegy.  However the Trelegy is not working well for him.  He feels Spiriva and Symbicort is of benefit for him.  Also recommended he start himself on Daliresp.  Given the GI side effect profile I communicated with his primary care physician about stopping Fosamax.  His primary care physician has advised Reclast infusion for him and is considering this but has not yet switched over.  This because the infusions will have to happen at [facility] long.  However he never started his Daliresp because he read the side effect profile and was worried about back pain.  He wanted me to go over the side effect profile of Daliresp all over again.  I printed up-to-date and went over all the side effects.  I did this with him and his wife.  There is a 3% incidence of backache.  The dominant feature is GI issues of weight loss and diarrhea and nausea and abdominal pain.  The second dominant feature is some anxiety issues.  After reading all this he has decided to start [place].  He is aware of the limitations as well.  He is aware that this is purely preventive in reducing COPD exacerbation frequency.  He understands that the burden on his health from frequent COPD exacerbations is high.  Currently COPD CAT score is back at baseline        OV 07/22/2018  Subjective:  Patient ID: Francisco [name], male , DOB: 04-06-1946 , age 67 y.o. , MRN: [id number] , ADDRESS: [place] [id number]   07/22/2018 -  Chief Complaint  Patient presents with   Follow-up    Pt states it has been rough since last visit. States he has been coughing with  white to yellow phlegm, wheezing, increased SOB which has been going on x2-3 weeks now. Pt denies any real complaints of chest tightness/chest pain.     HPI SOHAM HOLLETT II 73 y.o. -presents for follow-up of his advanced COPD with chronic hypoxemic respiratory failure.  At last visit we introduce Daliresp as is an effort to prevent COPD flareups.  After some trepidation he started taking it.  He is currently on full dose 5 mcg daily and is tolerating it well.  However he does not think it is reduced the frequency of flareups but again it is too soon to tell.  Approximately over a week ago after some sick contact exposure he had symptoms and signs of COPD exacerbation.  He was given I suspect Levaquin but this caused some confusion.  After that he was changed to doxycycline.  Today's his last of 7 days with doxycycline he is better.  COPD CAT score is improved to 26 but he still is coughing quite a bit and has mucus and feels that he is still not fully back to baseline.  He had a chest x-ray July 13, 2018 that reports this is clear.  He continues on oxygen Spiriva and Symbicort.  There are no other new issues.  No chest pain edema hemoptysis or weight loss.    OV 03/05/2019  Subjective:  Patient ID: Francisco [name], male , DOB: 19-Apr-1946 , age 20 y.o. , MRN: [id number] , ADDRESS: [place] [address]   03/05/2019 -   Chief Complaint  Patient presents with   COPD with acute exacerbation    Feels it is a little worse since June. Has cough with congestion, was white in color. But this morning it has a yellow and green color to it.     HPI WELLS MABE II 73 y.o. -returns for his advanced COPD follow-up.  He says that since his last visit he has been doing overall fair and stable.  Uses 3 L of oxygen, Daliresp, Spiriva, Symbicort.  He has been avoiding risk activities for COVID-19.  He says in the last few weeks he has had increased congestion he does not think this is  COVID-19.  Today he has green-yellow sputum.  He is asking about taking a flu shot.    OV 05/07/2019  Subjective:  Patient ID: Francisco [name], male , DOB: [birth year] , age 7 y.o. , MRN: [id number] , ADDRESS: [place] [address]   05/07/2019 -   Chief Complaint  Patient presents with   Follow-up    Pt states his breathing is at his baseline. Pt c/o prod cough with clear mucus - baseline for pt. Pt denies CP/tightness and f/c/s.    Advance COPD with chronic hypoxemic respiratory failure.  Baseline functional status ECOG 3-4  HPI Lesia Sago II 73 y.o. -presents for COPD follow-up.  He continues to be stable using oxygen.  His current COPD CAT score is 30 which is his baseline.  He recently had a vascular surgery.  He is on oxygen, prednisone, Daliresp, Spiriva and Symbicort.  He is gone back to taking azithromycin for prophylaxis m again COPD exacerbation.    CAT COPD Symptom & Quality of Life Score (GSK trademark) 0 is no burden. 5 is highest burden 12/05/2017  01/29/2018  05/11/2018  06/10/2018  07/22/2018  03/05/2019  05/07/2019   Never Cough -> Cough all the time 3 1 5 5 2 3 3   No phlegm in chest -> Chest is full of phlegm 2 2 4 5 3  3.5 3  No chest tightness -> Chest feels very tight 1 0 3 0 1 2 3   No dyspnea for 1 flight stairs/hill -> Very dyspneic for 1 flight of stairs 5 4 5 5 5 5 5   No limitations for ADL at home -> Very limited with ADL at home 5 5 5 5 5 5 4   Confident leaving home -> Not at all confident leaving home 3 3 3 4 4 3 4   Sleep soundly -> Do not sleep soundly because of lung condition 3 1 2  0 1 4 4   Lots of Energy -> No energy at all 4 4 5 5 5 4 4   TOTAL Score (max 40)  26 20 37 29 26 29.5 30    ROS - per HPI     has a past medical history of Chest x-ray abnormality, Chronic back pain, Chronic rhinitis, Compressed spine fracture (HCC), COPD (chronic obstructive pulmonary disease) ([place]), Dyspnea, Emphysema lung ([place]), Emphysema  of lung ([place]), On home oxygen therapy, Onychomycosis, Orthostatic hypotension, PAD (peripheral artery disease) ([place]), Pneumonia, Pulmonary embolism ([place]) (04/07/2018), Skin cancer, and Vertigo.   reports that he quit smoking about 19 months ago. His smoking use included pipe and cigarettes. He started smoking about 53 years ago. He has a 104.00 pack-year smoking history. He has never used smokeless tobacco.  Past Surgical History:  Procedure Laterality Date   APPENDECTOMY     ENDARTERECTOMY FEMORAL Right 04/19/2019   Procedure: ENDARTERECTOMY RIGHT COMMON FEMORAL;  Surgeon: Elam Dutch, MD;  Location: [facility];  Service: Vascular;  Laterality: Right;   LOWER EXTREMITY ANGIOGRAPHY  12/11/2018   LOWER EXTREMITY ANGIOGRAPHY N/A 12/11/2018   Procedure: LOWER EXTREMITY ANGIOGRAPHY;  Surgeon: Elam Dutch, MD;  Location: [facility];  Service: Cardiovascular;  Laterality: N/A;   PATCH ANGIOPLASTY Right 04/19/2019   Procedure: Patch Angioplasty;  Surgeon: Elam Dutch, MD;  Location: [facility] OR;  Service: Vascular;  Laterality: Right;   PERIPHERAL VASCULAR INTERVENTION Right 12/11/2018   Procedure: PERIPHERAL VASCULAR INTERVENTION;  Surgeon: Elam Dutch, MD;  Location: [facility];  Service: Cardiovascular;  Laterality: Right;  Common Iliac    SKIN CANCER EXCISION     "lips, face, ears, arms" (04/07/2018)   TONSILLECTOMY      Allergies  Allergen Reactions   Tape Other (See Comments)    SKIN IS VERY THIN; CAN ONLY USE COBAN WRAPS DUE TO CONDITION OF SKIN!!   Levaquin [Levofloxacin]     hallucinations    Immunization History  Administered Date(s) Administered   Fluad Quad(high Dose 65+) 03/16/2019   Influenza Split 04/24/2012   Influenza Whole 05/08/2009, 04/08/2011   Influenza, High Dose Seasonal PF 05/01/2017, 03/31/2018   Influenza,inj,Quad PF,6+ Mos 04/27/2013, 04/15/2014, 04/24/2015   Influenza,inj,quad, With Preservative 05/01/2018    Influenza-Unspecified 05/13/2016   Pneumococcal Conjugate-13 02/25/2014   Pneumococcal Polysaccharide-23 08/15/2011, 12/19/2016   Pneumococcal-Unspecified 03/08/2017   Td 02/21/2010   Tdap 11/05/2017   Zoster 01/29/2012    Family History  Problem Relation Age of Onset   Heart disease Mother        CABG in her 36s, nonsmoker   Cancer Mother    Stroke Father    Heart disease Father  Died of MI at age 66, smoker   Hepatitis Sister    Coronary artery disease Other        male 1st degree relative <60     Current Outpatient Medications:    acetaminophen (TYLENOL) 500 MG tablet, Take 1,000 mg by mouth 2 (two) times daily. , Disp: , Rfl:    albuterol (VENTOLIN HFA) 108 (90 Base) MCG/ACT inhaler, USE 2 PUFFS EVERY 6 HOURS  AS NEEDED FOR WHEEZING (Patient taking differently: Inhale 2 puffs into the lungs every 6 (six) hours as needed for wheezing or shortness of breath. ), Disp: 3 Inhaler, Rfl: 3   azithromycin (ZITHROMAX) 250 MG tablet, Take 1 tablet (250 mg total) by mouth every Monday, Wednesday, and Friday., Disp: 45 tablet, Rfl: 1   budesonide-formoterol (SYMBICORT) 160-4.5 MCG/ACT inhaler, USE 2 PUFFS BY MOUTH TWO  TIMES DAILY (Patient taking differently: Inhale 2 puffs into the lungs 2 (two) times daily. ), Disp: 30.6 g, Rfl: 2   Calcium Carbonate-Vitamin D (CALCIUM 600+D PO), Take 2 tablets by mouth daily., Disp: , Rfl:    DALIRESP 500 MCG TABS tablet, TAKE 1 TABLET BY MOUTH  DAILY (Patient taking differently: Take 500 mcg by mouth daily. ), Disp: 90 tablet, Rfl: 2   fluticasone (FLONASE) 50 MCG/ACT nasal spray, USE 2 SPRAYS IN EACH  NOSTRIL DAILY (Patient taking differently: Place 2 sprays into both nostrils daily. ), Disp: 48 g, Rfl: 1   furosemide (LASIX) 20 MG tablet, TAKE 1 TABLET (20 MG TOTAL) BY MOUTH DAILY AS NEEDED FOR FLUID OR EDEMA. (Patient taking differently: Take 20 mg by mouth See admin instructions. Take 1 tablet (20 mg) by mouth scheduled  every morning & may take an additional dose if needed for fluid retention/swelling.), Disp: 90 tablet, Rfl: 0   Misc. Devices (PULSE OXIMETER FOR FINGER) MISC, 1 Device by Does not apply route as needed., Disp: 1 each, Rfl: 0   OXYGEN, Inhale 3 L into the lungs continuous. , Disp: , Rfl:    predniSONE (DELTASONE) 10 MG tablet, Take 1 tablet (10 mg total) by mouth daily with breakfast., Disp: 90 tablet, Rfl: 3   SPIRIVA RESPIMAT 2.5 MCG/ACT AERS, USE 2 INHALATIONS BY MOUTH  ONCE DAILY (Patient taking differently: Inhale 2 puffs into the lungs daily. ), Disp: 12 g, Rfl: 3   Tetrahydrozoline HCl (REDNESS RELIEVER EYE DROPS OP), Place 1 drop into both eyes daily as needed (redness / itching)., Disp: , Rfl:    traMADol (ULTRAM) 50 MG tablet, Take 1 tablet (50 mg total) by mouth every 6 (six) hours as needed for moderate pain or severe pain., Disp: 20 tablet, Rfl: 1   XARELTO 20 MG TABS tablet, TAKE 1 TABLET BY MOUTH  DAILY WITH SUPPER (Patient taking differently: Take 20 mg by mouth daily. ), Disp: 90 tablet, Rfl: 3   predniSONE (DELTASONE) 10 MG tablet, 4 tabs for 2 days, then 3 tabs for 2 days, 2 tabs for 2 days, then 1 tab for 2 days, then stop, Disp: 20 tablet, Rfl: 0      Objective:   Vitals:   05/04/19 1006  BP: 104/66  Pulse: (!) 115  Temp: (!) 97.3 F (36.3 C)  TempSrc: Skin  SpO2: 97%  Weight: 166 lb (75.3 kg)  Height: 6' (1.829 m)    Estimated body mass index is 22.51 kg/m as calculated from the following:   Height as of this encounter: 6' (1.829 m).   Weight as of this encounter: 166 lb (  75.3 kg).  @WEIGHTCHANGE @  [employer]   05/04/19 1006  Weight: 166 lb (75.3 kg)     Physical Exam  General Appearance:    Alert, cooperative, no distress, appears stated age - older , Deconditioned looking - yes , OBESE  - no, Sitting on Wheelchair -  yes  Head:    Normocephalic, without obvious abnormality, atraumatic  Eyes:    PERRL, conjunctiva/corneas clear,  Ears:     Normal TM's and external ear canals, both ears  Nose:   Nares normal, septum midline, mucosa normal, no drainage    or sinus tenderness. OXYGEN ON  - yes . Patient is @ 3l   Throat:   Lips, mucosa, and tongue normal; teeth and gums normal. Cyanosis on lips - no  Neck:   Supple, symmetrical, trachea midline, no adenopathy;    thyroid:  no enlargement/tenderness/nodules; no carotid   bruit or JVD  Back:     Symmetric, no curvature, ROM normal, no CVA tenderness  Lungs:     Distress - no , Wheeze yes baseline, Barrell Chest - yes, Purse lip breathing - yes, Crackles - no   Chest Wall:    No tenderness or deformity.    Heart:    Regular rate and rhythm, S1 and S2 normal, no rub   or gallop, Murmur - no  Breast Exam:    NOT DONE  Abdomen:     Soft, non-tender, bowel sounds active all four quadrants,    no masses, no organomegaly. Visceral obesity - no  Genitalia:   NOT DONE  Rectal:   NOT DONE  Extremities:   Extremities - normal, Has Cane - no, Clubbing - yes midl, Edema - chronic  Pulses:   2+ and symmetric all extremities  Skin:   Stigmata of Connective Tissue Disease - no  Lymph nodes:   Cervical, supraclavicular, and axillary nodes normal  Psychiatric:  Neurologic:   Pleasant - yes, Anxious - mild, Flat affect - no  CAm-ICU - neg, Alert and Oriented x 3 - yes, Moves all 4s - yes, Speech - normal, Cognition - intact           Assessment:       ICD-10-CM   1. Stage 4 very severe COPD by GOLD classification ([place])  J44.9   2. Chronic respiratory failure with hypoxia (HCC)  J96.11        Plan:     Patient Instructions     ICD-10-CM   1. Stage 4 very severe COPD by GOLD classification ([place])  J44.9   2. Chronic respiratory failure with hypoxia (HCC)  J96.11     Stable disease  Plan  - continue o2, prednisone, daliresp, azithromycin, spiriv and symbicort schedule - albuterol as needed  Followup - call anytime if needed  - go to ER if worse  - 3 month routine followup;  CAT score at followup      SIGNATURE    Dr. Brand Males, M.D., F.C.C.P,  Pulmonary and Critical Care Medicine Staff Physician, [place] Director - Interstitial Lung Disease  Program  Pulmonary [place] [facility] [place], [place], [address]  Pager: [phone], If no answer or between  15:00h - 7:00h: call [phone] Telephone: [phone]  Late signature 4:43 PM 05/07/2019

## 2019-05-04 NOTE — Patient Instructions (Signed)
ICD-10-CM   1. Stage 4 very severe COPD by GOLD classification ([place])  J44.9   2. Chronic respiratory failure with hypoxia (HCC)  J96.11     Stable disease  Plan  - continue o2, prednisone, daliresp, azithromycin, spiriv and symbicort schedule - albuterol as needed  Followup - call anytime if needed  - go to ER if worse  - 3 month routine followup; CAT score at followup

## 2019-05-05 ENCOUNTER — Other Ambulatory Visit: Payer: Self-pay

## 2019-05-05 DIAGNOSIS — I739 Peripheral vascular disease, unspecified: Secondary | ICD-10-CM

## 2019-05-12 ENCOUNTER — Telehealth (HOSPITAL_COMMUNITY): Payer: Self-pay

## 2019-05-12 NOTE — Telephone Encounter (Signed)
The above patient or their representative was contacted and gave the following answers to these questions:         Do you have any of the following symptoms? No  Fever                    Cough                   Shortness of breath  Do  you have any of the following other symptoms?    muscle pain         vomiting,        diarrhea        rash         weakness        red eye        abdominal pain         bruising          bruising or bleeding              joint pain           severe headache    Have you been in contact with someone who was or has been sick in the past 2 weeks? No  Yes                 Unsure                         Unable to assess   Does the person that you were in contact with have any of the following symptoms?   Cough         shortness of breath           muscle pain         vomiting,            diarrhea            rash            weakness           fever            red eye           abdominal pain           bruising  or  bleeding                joint pain                severe headache

## 2019-05-13 ENCOUNTER — Ambulatory Visit (INDEPENDENT_AMBULATORY_CARE_PROVIDER_SITE_OTHER): Payer: Self-pay | Admitting: Physician Assistant

## 2019-05-13 ENCOUNTER — Other Ambulatory Visit: Payer: Self-pay

## 2019-05-13 ENCOUNTER — Ambulatory Visit (HOSPITAL_COMMUNITY)
Admission: RE | Admit: 2019-05-13 | Discharge: 2019-05-13 | Disposition: A | Discharge status: Home / Self Care | Payer: Medicare Other | Source: Ambulatory Visit | Attending: Family | Admitting: Family

## 2019-05-13 VITALS — BP 128/74 | HR 68 | Temp 97.9°F | Resp 14 | Ht 72.0 in | Wt 166.0 lb

## 2019-05-13 DIAGNOSIS — I739 Peripheral vascular disease, unspecified: Secondary | ICD-10-CM | POA: Insufficient documentation

## 2019-05-13 NOTE — Progress Notes (Signed)
POST OPERATIVE OFFICE NOTE    CC:  F/u for surgery  HPI:  This is a 73 y.o. male who is s/p Right common femoral endarterectomy bovine patch angioplasty.   He previously underwent right common iliac stenting December 11, 2018.  This was done for a nonhealing wound of the right foot.  Initially the foot healed.  Now he has had deterioration of the right third toe.  He also has some early symptoms of rest pain at nighttime.  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  Recent duplex scan showed that the right common iliac stent is patent.  ABI on the right side was 0.5.  ABI on the left was 0.97.  I reviewed the studies from March 22, 2019.  I also reviewed his arteriogram which showed subtotal occlusion of the mid to distal right common femoral artery with three-vessel runoff to the right foot.  He had inline flow to the left leg.  The patient has fairly significant pulmonary dysfunction.  He is on continuous home oxygen.  He has a dusky blue appearance to both feet chronically from hypoxia rather than arterial occlusive disease.  However, the ulcer on his right foot which has failed to heal certainly is related to his peripheral arterial disease in his right leg.  His pulmonologist is [name].  He states that today he has fairly tight chest symptoms from his COPD.  He thinks he needs a tune up of his COPD.  He is on Xarelto for prior pulmonary embolus.  Ulceration right third toe.  Baseline duskiness of the right foot.  This is symmetrical to his left foot.    He will have repeat ABI's today at f/u.    Allergies  Allergen Reactions  . Tape Other (See Comments)    SKIN IS VERY THIN; CAN ONLY USE COBAN WRAPS DUE TO CONDITION OF SKIN!!  . Levaquin [Levofloxacin]     hallucinations    Current Outpatient Medications  Medication Sig Dispense Refill  . acetaminophen (TYLENOL) 500 MG tablet Take 1,000 mg by mouth 2 (two) times daily.     [name] Kitchen  albuterol (VENTOLIN HFA) 108 (90 Base) MCG/ACT inhaler USE 2 PUFFS EVERY 6 HOURS  AS NEEDED FOR WHEEZING (Patient taking differently: Inhale 2 puffs into the lungs every 6 (six) hours as needed for wheezing or shortness of breath. ) 3 Inhaler 3  . azithromycin (ZITHROMAX) 250 MG tablet Take 1 tablet (250 mg total) by mouth every Monday, Wednesday, and Friday. 45 tablet 1  . budesonide-formoterol (SYMBICORT) 160-4.5 MCG/ACT inhaler USE 2 PUFFS BY MOUTH TWO  TIMES DAILY (Patient taking differently: Inhale 2 puffs into the lungs 2 (two) times daily. ) 30.6 g 2  . Calcium Carbonate-Vitamin D (CALCIUM 600+D PO) Take 2 tablets by mouth daily.    [name] Kitchen DALIRESP 500 MCG TABS tablet TAKE 1 TABLET BY MOUTH  DAILY (Patient taking differently: Take 500 mcg by mouth daily. ) 90 tablet 2  . fluticasone (FLONASE) 50 MCG/ACT nasal spray USE 2 SPRAYS IN EACH  NOSTRIL DAILY (Patient taking differently: Place 2 sprays into both nostrils daily. ) 48 g 1  . furosemide (LASIX) 20 MG tablet TAKE 1 TABLET (20 MG TOTAL) BY MOUTH DAILY AS NEEDED FOR FLUID OR EDEMA. (Patient taking differently: Take 20 mg by mouth See admin instructions. Take 1 tablet (20 mg) by mouth scheduled every morning & may take an additional dose if needed for fluid  retention/swelling.) 90 tablet 0  . Misc. Devices (PULSE OXIMETER FOR FINGER) MISC 1 Device by Does not apply route as needed. 1 each 0  . OXYGEN Inhale 3 L into the lungs continuous.     . predniSONE (DELTASONE) 10 MG tablet Take 1 tablet (10 mg total) by mouth daily with breakfast. 90 tablet 3  . predniSONE (DELTASONE) 10 MG tablet 4 tabs for 2 days, then 3 tabs for 2 days, 2 tabs for 2 days, then 1 tab for 2 days, then stop 20 tablet 0  . SPIRIVA RESPIMAT 2.5 MCG/ACT AERS USE 2 INHALATIONS BY MOUTH  ONCE DAILY (Patient taking differently: Inhale 2 puffs into the lungs daily. ) 12 g 3  . Tetrahydrozoline HCl (REDNESS RELIEVER EYE DROPS OP) Place 1 drop into both eyes daily as needed (redness /  itching).    . traMADol (ULTRAM) 50 MG tablet Take 1 tablet (50 mg total) by mouth every 6 (six) hours as needed for moderate pain or severe pain. 20 tablet 1  . XARELTO 20 MG TABS tablet TAKE 1 TABLET BY MOUTH  DAILY WITH SUPPER (Patient taking differently: Take 20 mg by mouth daily. ) 90 tablet 3   No current facility-administered medications for this visit.      ROS:  See HPI  Physical Exam:    ABI Findings: +---------+------------------+-----+----------+--------+ Right    Rt Pressure (mmHg)IndexWaveform  Comment  +---------+------------------+-----+----------+--------+ Brachial 190                                       +---------+------------------+-----+----------+--------+ ATA      149               0.77                    +---------+------------------+-----+----------+--------+ PTA      177               0.91 triphasic          +---------+------------------+-----+----------+--------+ DP                              monophasic         +---------+------------------+-----+----------+--------+ Great Toe11                0.06                    +---------+------------------+-----+----------+--------+  +---------+------------------+-----+---------+-------+ Left     Lt Pressure (mmHg)IndexWaveform Comment +---------+------------------+-----+---------+-------+ Brachial 194                                     +---------+------------------+-----+---------+-------+ ATA      150               0.77                  +---------+------------------+-----+---------+-------+ PTA      162               0.84 triphasic        +---------+------------------+-----+---------+-------+ DP                              biphasic         +---------+------------------+-----+---------+-------+ Doristine Devoid  Toe121               0.62                  +---------+------------------+-----+---------+-------+   +-------+-----------+-----------+------------+------------+ ABI/TBIToday's ABIToday's TBIPrevious ABIPrevious TBI +-------+-----------+-----------+------------+------------+ Right  0.91       0.06       0.54        0.30         +-------+-----------+-----------+------------+------------+ Left   0.84       0.62       0.97        0.78         +-------+-----------+-----------+------------+------------+   Incision:   Healing well, third toe healing with good skin color, chronic B LE edema without change.  Right groin incision has healed well, soft. Doppler signals brisk right LE  Assessment/Plan:  This is a 73 y.o. male who is s/p: Right common femoral endarterectomy bovine patch angioplasty.   Patent arterial blood flow with healing right third toe wound.  Improved ABI's .  He states his right foot feels better.    He will need a f/u Aorto/IVC/illiac duplex in March with a f/u with [name].    Roxy Horseman PA-C [facility] [phone]  Clinic MD:  Scot Dock

## 2019-05-21 ENCOUNTER — Other Ambulatory Visit: Payer: Self-pay | Admitting: Family Medicine

## 2019-05-21 MED ORDER — DOXYCYCLINE HYCLATE 100 MG PO TABS: 100.0000 mg | ORAL_TABLET | Freq: Two times a day (BID) | ORAL | 0 refills | Status: DC

## 2019-05-21 MED ORDER — PREDNISONE 10 MG PO TABS: ORAL_TABLET | ORAL | 0 refills | Status: DC

## 2019-05-21 NOTE — Telephone Encounter (Signed)
I called and spoke with the pt  He states nurse came out to his home today and stated his lungs sounded congested  He can feel the increased congestion coming on for approx a wk now with increased coughing with minimal clear sputum  He is having slight increase in wheezing and SOB  He is not having any fevers, chills, body aches, and denies any recent travel or sick contacts  Wants to get something called in before the wkend  [facility]  Please advise thanks  Allergies  Allergen Reactions  . Tape Other (See Comments)    SKIN IS VERY THIN; CAN ONLY USE COBAN WRAPS DUE TO CONDITION OF SKIN!!  . Levaquin [Levofloxacin]     hallucinations

## 2019-05-21 NOTE — Telephone Encounter (Signed)
Spoke with the pt and notified of recs per MR  She verbalized understanding  Rxs were sent to pharm

## 2019-05-21 NOTE — Telephone Encounter (Signed)
Returning call - please call [phone]

## 2019-05-21 NOTE — Telephone Encounter (Signed)
LMTCB

## 2019-05-21 NOTE — Telephone Encounter (Signed)
Allergies  Allergen Reactions  . Tape Other (See Comments)    SKIN IS VERY THIN; CAN ONLY USE COBAN WRAPS DUE TO CONDITION OF SKIN!!  . Levaquin [Levofloxacin]     hallucinations     Take doxycycline 100mg  po twice daily x 5 days; take after meals and avoid sunlight (hold azithro during this time)   Take prednisone 40 mg daily x 2 days, then 20mg  daily x 2 days, then 10mg  daily x 2 days, then 5mg  daily x 2 days and stop and then baseline prednisone

## 2019-06-01 ENCOUNTER — Other Ambulatory Visit: Payer: Self-pay

## 2019-06-02 ENCOUNTER — Encounter: Payer: Self-pay | Admitting: Family Medicine

## 2019-06-02 ENCOUNTER — Ambulatory Visit (INDEPENDENT_AMBULATORY_CARE_PROVIDER_SITE_OTHER): Payer: Medicare Other | Admitting: Family Medicine

## 2019-06-02 VITALS — BP 132/84 | HR 101 | Temp 98.4°F | Ht 72.0 in | Wt 168.4 lb

## 2019-06-02 DIAGNOSIS — Z0000 Encounter for general adult medical examination without abnormal findings: Secondary | ICD-10-CM | POA: Diagnosis not present

## 2019-06-02 DIAGNOSIS — J438 Other emphysema: Secondary | ICD-10-CM

## 2019-06-02 DIAGNOSIS — I10 Essential (primary) hypertension: Secondary | ICD-10-CM

## 2019-06-02 DIAGNOSIS — J9611 Chronic respiratory failure with hypoxia: Secondary | ICD-10-CM

## 2019-06-02 DIAGNOSIS — Z8781 Personal history of (healed) traumatic fracture: Secondary | ICD-10-CM | POA: Diagnosis not present

## 2019-06-02 DIAGNOSIS — R739 Hyperglycemia, unspecified: Secondary | ICD-10-CM | POA: Diagnosis not present

## 2019-06-02 DIAGNOSIS — I2782 Chronic pulmonary embolism: Secondary | ICD-10-CM

## 2019-06-02 DIAGNOSIS — Z87891 Personal history of nicotine dependence: Secondary | ICD-10-CM

## 2019-06-02 DIAGNOSIS — R319 Hematuria, unspecified: Secondary | ICD-10-CM

## 2019-06-02 DIAGNOSIS — I739 Peripheral vascular disease, unspecified: Secondary | ICD-10-CM

## 2019-06-02 DIAGNOSIS — M818 Other osteoporosis without current pathological fracture: Secondary | ICD-10-CM | POA: Diagnosis not present

## 2019-06-02 DIAGNOSIS — E785 Hyperlipidemia, unspecified: Secondary | ICD-10-CM

## 2019-06-02 DIAGNOSIS — G250 Essential tremor: Secondary | ICD-10-CM

## 2019-06-02 LAB — COMPREHENSIVE METABOLIC PANEL
ALT: 22 U/L (ref 0–53)
AST: 25 U/L (ref 0–37)
Albumin: 3.9 g/dL (ref 3.5–5.2)
Alkaline Phosphatase: 66 U/L (ref 39–117)
BUN: 10 mg/dL (ref 6–23)
CO2: 33 mEq/L — ABNORMAL HIGH (ref 19–32)
Calcium: 9.9 mg/dL (ref 8.4–10.5)
Chloride: 97 mEq/L (ref 96–112)
Creatinine, Ser: 0.91 mg/dL (ref 0.40–1.50)
GFR: 81.96 mL/min (ref >60.00)
Glucose, Bld: 111 mg/dL — ABNORMAL HIGH (ref 70–99)
Potassium: 4.8 mEq/L (ref 3.5–5.1)
Sodium: 138 mEq/L (ref 135–145)
Total Bilirubin: 0.6 mg/dL (ref 0.2–1.2)
Total Protein: 6.8 g/dL (ref 6.0–8.3)

## 2019-06-02 LAB — CBC WITH DIFFERENTIAL/PLATELET
Basophils Absolute: 0 10*3/uL (ref 0.0–0.1)
Basophils Relative: 0.4 % (ref 0.0–3.0)
Eosinophils Absolute: 0 10*3/uL (ref 0.0–0.7)
Eosinophils Relative: 0.1 % (ref 0.0–5.0)
HCT: 36.8 % — ABNORMAL LOW (ref 39.0–52.0)
Hemoglobin: 12.2 g/dL — ABNORMAL LOW (ref 13.0–17.0)
Lymphocytes Relative: 4.8 % — ABNORMAL LOW (ref 12.0–46.0)
Lymphs Abs: 0.5 10*3/uL — ABNORMAL LOW (ref 0.7–4.0)
MCHC: 33.1 g/dL (ref 30.0–36.0)
MCV: 94.2 fl (ref 78.0–100.0)
Monocytes Absolute: 0.5 10*3/uL (ref 0.1–1.0)
Monocytes Relative: 5.2 % (ref 3.0–12.0)
Neutro Abs: 9.1 10*3/uL — ABNORMAL HIGH (ref 1.4–7.7)
Neutrophils Relative %: 89.5 % — ABNORMAL HIGH (ref 43.0–77.0)
Platelets: 311 10*3/uL (ref 150.0–400.0)
RBC: 3.9 Mil/uL — ABNORMAL LOW (ref 4.22–5.81)
RDW: 12.8 % (ref 11.5–15.5)
WBC: 10.1 10*3/uL (ref 4.0–10.5)

## 2019-06-02 LAB — LIPID PANEL
Cholesterol: 251 mg/dL — ABNORMAL HIGH (ref 0–200)
HDL: 141.9 mg/dL (ref >39.00)
LDL Cholesterol: 99 mg/dL (ref 0–99)
NonHDL: 108.6
Total CHOL/HDL Ratio: 2
Triglycerides: 49 mg/dL (ref 0.0–149.0)
VLDL: 9.8 mg/dL (ref 0.0–40.0)

## 2019-06-02 LAB — VITAMIN D 25 HYDROXY (VIT D DEFICIENCY, FRACTURES): VITD: 36.11 ng/mL (ref 30.00–100.00)

## 2019-06-02 LAB — HEMOGLOBIN A1C: Hgb A1c MFr Bld: 4.9 % (ref 4.6–6.5)

## 2019-06-02 MED ORDER — RIVAROXABAN 20 MG PO TABS: ORAL_TABLET | ORAL | 3 refills | Status: DC

## 2019-06-02 MED ORDER — TRAMADOL HCL 50 MG PO TABS: 50.0000 mg | ORAL_TABLET | Freq: Two times a day (BID) | ORAL | 1 refills | Status: DC | PRN

## 2019-06-02 MED ORDER — FUROSEMIDE 20 MG PO TABS: ORAL_TABLET | ORAL | 3 refills | Status: DC

## 2019-06-02 NOTE — Progress Notes (Signed)
Phone: [phone]   Subjective:  Patient presents today for their annual physical. Chief complaint-noted.   See problem oriented charting- Review of Systems  Constitutional: Negative.   HENT: Positive for hearing loss.        Increased hearing loss   Eyes: Negative.   Respiratory: Positive for cough, sputum production and shortness of breath.   Cardiovascular: Positive for leg swelling.  Gastrointestinal: Negative.   Genitourinary: Negative.   Musculoskeletal: Negative.   Skin: Negative.   Neurological: Negative.   Endo/Heme/Allergies: Negative.   Psychiatric/Behavioral: Negative.    The following were reviewed and entered/updated in epic: Past Medical History:  Diagnosis Date  . Chest x-ray abnormality   . Chronic back pain    "mid and lower" (04/07/2018)  . Chronic rhinitis    -Sinus Ct 08/01/2009 >> Bilateral maxillary sinusitis with some mucosal thickeningin the sphenoid and frontal sinuses as well with air fluid levels present -chronic rhinitis flyer Aug 04, 2009  . Compressed spine fracture ([place])   . COPD (chronic obstructive pulmonary disease) [facility])    PFT's rec Jul 17, 2009  . Dyspnea   . Emphysema lung ([place])   . Emphysema of lung ([place])   . On home oxygen therapy    "3L; 24/7" (04/07/2018)  . Onychomycosis    Dr. Judi Cong  . Orthostatic hypotension    "since 10/2017" (04/07/2018)  . PAD (peripheral artery disease) ([place])   . Pneumonia    "twice in 1 year" (04/07/2018)  . Pulmonary embolism ([place]) 04/07/2018  . Skin cancer    "lips, face, ears, arms" (04/07/2018)  . Vertigo    "since ~ 02/2018" (04/07/2018)   Patient Active Problem List   Diagnosis Date Noted  . DNR (do not resuscitate) 04/21/2018    Priority: High  . Chronic pulmonary embolism ([place]) 04/07/2018    Priority: High  . Osteoporosis 11/19/2017    Priority: High  . Thoracic compression fracture ([place]) 11/02/2017    Priority: High  . Former smoker 08/23/2008    Priority: High  . COPD (chronic  obstructive pulmonary disease) with emphysema ([place]) 08/23/2008    Priority: High  . Essential tremor 03/31/2018    Priority: Medium  . BPPV (benign paroxysmal positional vertigo), right 02/10/2018    Priority: Medium  . Essential hypertension 11/02/2017    Priority: Medium  . BPH associated with nocturia 06/25/2017    Priority: Medium  . Hyperglycemia 06/12/2016    Priority: Medium  . Hyperlipidemia 07/22/2014    Priority: Medium  . Venous stasis dermatitis of both lower extremities 11/02/2017    Priority: Low  . Chronic respiratory failure with hypoxia ([place]) 09/23/2017    Priority: Low  . Leukocytosis 12/19/2016    Priority: Low  . Onychomycosis 07/22/2014    Priority: Low  . Left ankle swelling 07/22/2014    Priority: Low  . History of skin cancer 05/17/2014    Priority: Low  . Chronic rhinitis 10/28/2012    Priority: Low  . Pulmonary nodule 09/23/2011    Priority: Low  . History of colonic polyps 08/23/2008    Priority: Low  . PAD (peripheral artery disease) ([place]) 12/11/2018  . Surgical counseling visit 12/10/2018  . COPD with acute exacerbation ([place]) 11/27/2018  . Stage 4 very severe COPD by GOLD classification ([place]) 11/27/2018  . Diastolic dysfunction 69/62/9528  . Syncope 11/02/2017   Past Surgical History:  Procedure Laterality Date  . APPENDECTOMY    . ENDARTERECTOMY FEMORAL Right 04/19/2019   Procedure: ENDARTERECTOMY RIGHT  COMMON FEMORAL;  Surgeon: Elam Dutch, MD;  Location: [facility];  Service: Vascular;  Laterality: Right;  . LOWER EXTREMITY ANGIOGRAPHY  12/11/2018  . LOWER EXTREMITY ANGIOGRAPHY N/A 12/11/2018   Procedure: LOWER EXTREMITY ANGIOGRAPHY;  Surgeon: Elam Dutch, MD;  Location: [facility];  Service: Cardiovascular;  Laterality: N/A;  . PATCH ANGIOPLASTY Right 04/19/2019   Procedure: Patch Angioplasty;  Surgeon: Elam Dutch, MD;  Location: [facility];  Service: Vascular;  Laterality: Right;  . PERIPHERAL VASCULAR INTERVENTION  Right 12/11/2018   Procedure: PERIPHERAL VASCULAR INTERVENTION;  Surgeon: Elam Dutch, MD;  Location: [facility];  Service: Cardiovascular;  Laterality: Right;  Common Iliac   . SKIN CANCER EXCISION     "lips, face, ears, arms" (04/07/2018)  . TONSILLECTOMY      Family History  Problem Relation Age of Onset  . Heart disease Mother        CABG in her 38s, nonsmoker  . Cancer Mother   . Stroke Father   . Heart disease Father        Died of MI at age 6, smoker  . Hepatitis Sister   . Coronary artery disease Other        male 1st degree relative <60    Medications- reviewed and updated Current Outpatient Medications  Medication Sig Dispense Refill  . acetaminophen (TYLENOL) 500 MG tablet Take 1,000 mg by mouth 2 (two) times daily.     [name] Kitchen albuterol (VENTOLIN HFA) 108 (90 Base) MCG/ACT inhaler USE 2 PUFFS EVERY 6 HOURS  AS NEEDED FOR WHEEZING (Patient taking differently: Inhale 2 puffs into the lungs every 6 (six) hours as needed for wheezing or shortness of breath. ) 3 Inhaler 3  . azithromycin (ZITHROMAX) 250 MG tablet Take 1 tablet (250 mg total) by mouth every Monday, Wednesday, and Friday. 45 tablet 1  . budesonide-formoterol (SYMBICORT) 160-4.5 MCG/ACT inhaler USE 2 PUFFS BY MOUTH TWO  TIMES DAILY (Patient taking differently: Inhale 2 puffs into the lungs 2 (two) times daily. ) 30.6 g 2  . Calcium Carbonate-Vitamin D (CALCIUM 600+D PO) Take 2 tablets by mouth daily.    [name] Kitchen DALIRESP 500 MCG TABS tablet TAKE 1 TABLET BY MOUTH  DAILY (Patient taking differently: Take 500 mcg by mouth daily. ) 90 tablet 2  . fluticasone (FLONASE) 50 MCG/ACT nasal spray USE 2 SPRAYS IN EACH  NOSTRIL DAILY (Patient taking differently: Place 2 sprays into both nostrils daily. ) 48 g 1  . furosemide (LASIX) 20 MG tablet TAKE 1 TABLET BY MOUTH  DAILY 90 tablet 3  . Misc. Devices (PULSE OXIMETER FOR FINGER) MISC 1 Device by Does not apply route as needed. 1 each 0  . OXYGEN Inhale 3 L into the lungs  continuous.     . predniSONE (DELTASONE) 10 MG tablet Take 1 tablet (10 mg total) by mouth daily with breakfast. 90 tablet 3  . rivaroxaban (XARELTO) 20 MG TABS tablet TAKE 1 TABLET BY MOUTH  DAILY 90 tablet 3  . SPIRIVA RESPIMAT 2.5 MCG/ACT AERS USE 2 INHALATIONS BY MOUTH  ONCE DAILY (Patient taking differently: Inhale 2 puffs into the lungs daily. ) 12 g 3  . Tetrahydrozoline HCl (REDNESS RELIEVER EYE DROPS OP) Place 1 drop into both eyes daily as needed (redness / itching).    . traMADol (ULTRAM) 50 MG tablet Take 1 tablet (50 mg total) by mouth every 12 (twelve) hours as needed for moderate pain or severe pain. 30 tablet 1  No current facility-administered medications for this visit.     Allergies-reviewed and updated Allergies  Allergen Reactions  . Tape Other (See Comments)    SKIN IS VERY THIN; CAN ONLY USE COBAN WRAPS DUE TO CONDITION OF SKIN!!  . Levaquin [Levofloxacin]     hallucinations    Social History   Social History Narrative   Married (wife Katy Apo patient of Dr. Yong Channel) with children (2-son and daughter). 1 grandson.       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       Hobbies: fishing, projects at home   Objective  Objective:  BP 132/84   Pulse (!) 101   Temp 98.4 F (36.9 C) (Temporal)   Ht 6' (1.829 m)   Wt 168 lb 6.4 oz (76.4 kg)   SpO2 98% Comment: on 3 L in ofifce.  BMI 22.84 kg/m  Gen: NAD, resting comfortably HEENT: Mask not removed due to covid 19. TM normal. Bridge of nose normal. Eyelids normal.  Neck: no thyromegaly or cervical lymphadenopathy  CV: RRR on my exam no murmurs rubs or gallops Lungs: CTAB no crackles, wheeze, rhonchi Abdomen: soft/nontender/nondistended/normal bowel sounds. No rebound or guarding.  Ext: 2+ edema and cannot use compression stockings anymore Skin: warm, dry, venous stasis skin changes on legs, much better perfusion from may visit in toes on right foot Neuro: grossly normal, moves  all extremities, PERLA, in wheelchair today   Assessment and Plan  73 y.o. male presenting for annual physical.  Health Maintenance counseling: 1. Anticipatory guidance: Patient counseled regarding regular dental exams q6 months, eye exams yearly- possible cataract removal,  avoiding smoking and second hand smoke , limiting alcohol to 2 beverages per day- about 5-6 beers a day- encouraged him to cut down- discussed possible AA video calls .   2. Risk factor reduction:  Advised patient of need for regular exercise and diet rich and fruits and vegetables to reduce risk of heart attack and stroke. Exercise- not able to walk more than 41ft-primarily uses wheel chair. Diet-appetite has decreased-fortunately is maintaining weight-actually up 2 pounds from May.  Wt Readings from Last 3 Encounters:  06/02/19 168 lb 6.4 oz (76.4 kg)  05/13/19 166 lb (75.3 kg)  05/04/19 166 lb (75.3 kg)  3. Immunizations/screenings/ancillary studies-patient would be a prime candidate/high-priority for COVID-19 vaccination due to lung disease.  Discussed Shingrix-defers due to COVID-19  Immunization History  Administered Date(s) Administered  . Fluad Quad(high Dose 65+) 03/16/2019  . Influenza Split 04/24/2012  . Influenza Whole 05/08/2009, 04/08/2011  . Influenza, High Dose Seasonal PF 05/01/2017, 03/31/2018  . Influenza,inj,Quad PF,6+ Mos 04/27/2013, 04/15/2014, 04/24/2015  . Influenza,inj,quad, With Preservative 05/01/2018  . Influenza-Unspecified 05/13/2016  . Pneumococcal Conjugate-13 02/25/2014  . Pneumococcal Polysaccharide-23 08/15/2011, 12/19/2016  . Pneumococcal-Unspecified 03/08/2017  . Td 02/21/2010  . Tdap 11/05/2017  . Zoster 01/29/2012   4. Prostate cancer screening-past age based screening recommendations.  Lab Results  Component Value Date   PSA 1.02 06/25/2017   PSA 1.17 06/05/2016   PSA 0.71 02/25/2014   5. Colon cancer screening - not able to have due to advanced pulmonary disease 6. Skin  cancer screening- followed by Dermatology Dr. Ronnald Ramp. advised regular sunscreen use. Denies worrisome, changing, or new skin lesions.  7. former smoker-possible blood on prior urine-update urine microscopic today- he will bring sample back as cannot pee today.  He quit smoking several years ago-congratulated once again   Status of chronic or acute concerns  Chronic respiratory failure with hypoxia ([place]) Other emphysema ([place]) -Patient continues on home oxygen.  Limited by lungs in ability to ambulate.  He is on Symbicort, Daliresp, Spiriva, chronic prednisone.  He is also on Singulair and Flonase for allergies.  He is also on azithromycin 3 times a week  - has had flare ups ever 3-5 weeks taking boos of prednisone and antibiotic use  Other chronic pulmonary embolism, unspecified whether acute cor pulmonale present ([place]) -Remains on chronic Xarelto   Age-related osteoporosis with current pathological fracture, initial encounter - Plan: Vitamin D  (25 hydroxy ).  Will need to change to prior fracture.  Update vitamin D-currently on 1200 mg per day   Compression fracture of T7 vertebra, initial encounter (HCC)-will change to history of compression fracture-no recurrence since last year- change to history of    PAD (peripheral artery disease) (HCC)-follows closely with vascular surgery-status post right common femoral endarterectomy bovine patch angioplasty.  This has helped healing with right third toe wound.  ABIs have improved.   Hyperlipidemia, unspecified hyperlipidemia type -Has peripheral arterial disease so would be ideal to be on statin if LDL elevated.  Update lipid panel with LDL goal 70 or less . Consider rosuvastatin Lab Results  Component Value Date   CHOL 262 (H) 12/25/2017   HDL 143.30 12/25/2017   LDLCALC 107 (H) 12/25/2017   LDLDIRECT 75.0 12/06/2016   TRIG 54.0 12/25/2017   CHOLHDL 2 12/25/2017   Essential hypertension - Plan: CBC with Differential/Platelet, Comprehensive  metabolic panel, Lipid panel -Controlled primarily with Lasix on daily basis.  Unable to use compression stockings due to PAD.   Hyperglycemia-order A1c with labs due to prior elevated blood sugars. Likely related to prednisone- he also eats carbs   Essential tremor-stable without meds   Hematuria, unspecified type - Plan: Urine Microscopic-noted on last labs so will check urine microscopic   Primarily uses tramadol for back pain. Cannot use nsaids due to xarelto plus PAD history. Once every 2-3 weeks.   Recommended follow up: Return in about 6 months (around 11/30/2019) for follow up- or sooner if needed. Future Appointments  Date Time Provider [place]  08/19/2019  1:00 PM Elam Dutch, MD [facility]   Lab/Order associations: fasting   ICD-10-CM   1. Preventative health care  Z00.00 CBC with Differential/Platelet    Comprehensive metabolic panel    Lipid panel    Urine Microscopic    Vitamin D  (25 hydroxy )  2. Other chronic pulmonary embolism, unspecified whether acute cor pulmonale present (HCC)  I27.82   3. Other osteoporosis without current pathological fracture  M81.8 Vitamin D  (25 hydroxy )  4. History of vertebral compression fracture  Z87.81   5. Other emphysema ([place])  J43.8   6. Chronic respiratory failure with hypoxia (HCC)  J96.11   7. PAD (peripheral artery disease) (HCC)  I73.9   8. Former smoker  Z87.891   4. Essential hypertension  I10 CBC with Differential/Platelet    Comprehensive metabolic panel    Lipid panel  10. Hyperglycemia  R73.9 Hemoglobin A1c  11. Essential tremor  G25.0   12. Hyperlipidemia, unspecified hyperlipidemia type  E78.5   13. Hematuria, unspecified type  R31.9 Urine Microscopic    Meds ordered this encounter  Medications  . rivaroxaban (XARELTO) 20 MG TABS tablet    Sig: TAKE 1 TABLET BY MOUTH  DAILY    Dispense:  90 tablet    Refill:  3    Requesting  1 year supply  . furosemide (LASIX) 20 MG tablet    Sig: TAKE 1  TABLET BY MOUTH  DAILY    Dispense:  90 tablet    Refill:  3    Requesting 1 year supply  . traMADol (ULTRAM) 50 MG tablet    Sig: Take 1 tablet (50 mg total) by mouth every 12 (twelve) hours as needed for moderate pain or severe pain.    Dispense:  30 tablet    Refill:  1    Return precautions advised.  Garret Reddish, MD

## 2019-06-02 NOTE — Addendum Note (Signed)
Addended by: Clyde Lundborg A on: 06/02/2019 11:11 AM   Modules accepted: Orders

## 2019-06-02 NOTE — Patient Instructions (Addendum)
Recommended follow up: Return in about 6 months (around 11/30/2019) for follow up- or sooner if needed. but honestly lets wait until we have a vaccine on hand to see you in person in office again as long as that's under 9 months   Francisco [name] , Thank you for taking time to come for your Medicare Wellness Visit. I appreciate your ongoing commitment to your health goals. Please review the following plan we discussed and let me know if I can assist you in the future.   These are the goals we discussed: 1. Update labs- I am going to ask them to come into draw these. Team- Change urine test to future and you can drop this off next week. Team pleaes bring urine cup  2. Get covid vaccine when available! 3. Continue close follow up with pulmonology and vascular surgery   This is a list of the screening recommended for you and due dates:  Health Maintenance  Topic Date Due  .  Hepatitis C: One time screening is recommended by Center for Disease Control  (CDC) for  adults born from [birth year] through 1963.   07/04/2098*  . Colon Cancer Screening  04/02/2020  . Tetanus Vaccine  11/06/2027  . Flu Shot  Completed  . Pneumonia vaccines  Completed  *Topic was postponed. The date shown is not the original due date.

## 2019-06-02 NOTE — Progress Notes (Signed)
Phone: [phone]    Subjective:   Patient presents today for their annual wellness visit.    Preventive Screening-Counseling & Management  Smoking Status: former Smoker- getting urine microscopic Second Hand Smoking status: No smokers in home Alcohol intake: 5-6 per day- discussed cessation/AA- he is not interested  Risk Factors Regular exercise: limited by lungs Diet: heavy carbs, weight at least stable which is our primary goal for him  Wt Readings from Last 3 Encounters:  06/02/19 168 lb 6.4 oz (76.4 kg)  05/13/19 166 lb (75.3 kg)  05/04/19 166 lb (75.3 kg)    Mobility assessment:  Limited due to lungs- in wheelchair today Fall Risk:  Yes due to deconditioning from lungs. Could consider PT/OT but with covid 19 wanting limit exposures.  Fall Risk  06/02/2019 03/31/2018 06/25/2017 06/12/2016 04/24/2015  Falls in the past year? 1 Yes No No No  Number falls in past yr: 0 2 or more - - -  Injury with Fall? 1 Yes - - -  Risk Factor Category  - High Fall Risk - - -  Risk for fall due to : History of fall(s);Impaired balance/gait History of fall(s) - - -  Follow up - Education provided;Falls prevention discussed - - -  Opioid use history: on tramadol- otherwise no long term opioids use  Cardiac risk factors:  advanced age (older than 36 for men, 56 for women)  Known PAD known Hyperlipidemia - in light of PAD- considering statin known Hypertension - controlled today No diabetes. Will get a1c though No results found for: HGBA1C Family History: mom in 29s and dad in 58s- at age 63, sister with heart surgery   Depression Screen None. PHQ2 0   Activities of Daily Living  ADLs (toileting, bathing, dressing, transferring, eating)- able to toilet without assist. Intermittent help with finishing bathing- draining bathtub, can dress self, able to do most transfers, eats without diffulty  IADLs (shopping, housekeeping, managing own medications, and handling finances)- not shopping  now due to covid 19, housekeeping unable to do due to lungs. He can manage meds. Manages finances  Hearing Difficulties: -patient endorses intermittent issues with hearing- improves with debrox- needs to use currently in right ear  Cognitive Testing             No reported trouble.   Mini cog: normal clock draw. 3/3 delayed recall. Normal test result   List the Names of Other Physician/Practitioners you currently use: Patient Care Team: [name], MD as PCP - General (Family Medicine) Brand Males, MD as Consulting Physician (Pulmonary Disease) Danella Sensing, MD as Consulting Physician (Dermatology) Dr. Henreitta Leber, DDS (Dentistry)   Immunization History  Administered Date(s) Administered  . Fluad Quad(high Dose 65+) 03/16/2019  . Influenza Split 04/24/2012  . Influenza Whole 05/08/2009, 04/08/2011  . Influenza, High Dose Seasonal PF 05/01/2017, 03/31/2018  . Influenza,inj,Quad PF,6+ Mos 04/27/2013, 04/15/2014, 04/24/2015  . Influenza,inj,quad, With Preservative 05/01/2018  . Influenza-Unspecified 05/13/2016  . Pneumococcal Conjugate-13 02/25/2014  . Pneumococcal Polysaccharide-23 08/15/2011, 12/19/2016  . Pneumococcal-Unspecified 03/08/2017  . Td 02/21/2010  . Tdap 11/05/2017  . Zoster 01/29/2012   Required Immunizations needed today - defer shingrix due to covid 19 Health Maintenance  Topic Date Due  .  Hepatitis C: One time screening is recommended by Center for Disease Control  (CDC) for  adults born from [birth year] through 1963.   07/04/2098*  . Colon Cancer Screening  04/02/2020  . Tetanus Vaccine  11/06/2027  . Flu Shot  Completed  .  Pneumonia vaccines  Completed  *Topic was postponed. The date shown is not the original due date.    Screening tests-  1. Colon cancer screening- not a candidate due to lung function 2. Lung Cancer screening- not a candidate due to lung function 3. Skin cancer screening- follows with dermatology 4. Prostate cancer  screening- not a candidate due to lung function  ROS- No pertinent positives discovered in course of AWV ROS- baseline SOB, debility due to lungs  The following were reviewed and entered/updated in epic: Past Medical History:  Diagnosis Date  . Chest x-ray abnormality   . Chronic back pain    "mid and lower" (04/07/2018)  . Chronic rhinitis    -Sinus Ct 08/01/2009 >> Bilateral maxillary sinusitis with some mucosal thickeningin the sphenoid and frontal sinuses as well with air fluid levels present -chronic rhinitis flyer Aug 04, 2009  . Compressed spine fracture ([place])   . COPD (chronic obstructive pulmonary disease) [facility])    PFT's rec Jul 17, 2009  . Dyspnea   . Emphysema lung ([place])   . Emphysema of lung ([place])   . On home oxygen therapy    "3L; 24/7" (04/07/2018)  . Onychomycosis    Dr. Judi Cong  . Orthostatic hypotension    "since 10/2017" (04/07/2018)  . PAD (peripheral artery disease) ([place])   . Pneumonia    "twice in 1 year" (04/07/2018)  . Pulmonary embolism ([place]) 04/07/2018  . Skin cancer    "lips, face, ears, arms" (04/07/2018)  . Vertigo    "since ~ 02/2018" (04/07/2018)   Patient Active Problem List   Diagnosis Date Noted  . DNR (do not resuscitate) 04/21/2018    Priority: High  . Chronic pulmonary embolism ([place]) 04/07/2018    Priority: High  . Osteoporosis 11/19/2017    Priority: High  . Thoracic compression fracture ([place]) 11/02/2017    Priority: High  . Former smoker 08/23/2008    Priority: High  . COPD (chronic obstructive pulmonary disease) with emphysema ([place]) 08/23/2008    Priority: High  . Essential tremor 03/31/2018    Priority: Medium  . BPPV (benign paroxysmal positional vertigo), right 02/10/2018    Priority: Medium  . Essential hypertension 11/02/2017    Priority: Medium  . BPH associated with nocturia 06/25/2017    Priority: Medium  . Hyperglycemia 06/12/2016    Priority: Medium  . Hyperlipidemia 07/22/2014    Priority: Medium  . Venous stasis  dermatitis of both lower extremities 11/02/2017    Priority: Low  . Chronic respiratory failure with hypoxia ([place]) 09/23/2017    Priority: Low  . Leukocytosis 12/19/2016    Priority: Low  . Onychomycosis 07/22/2014    Priority: Low  . Left ankle swelling 07/22/2014    Priority: Low  . History of skin cancer 05/17/2014    Priority: Low  . Chronic rhinitis 10/28/2012    Priority: Low  . Pulmonary nodule 09/23/2011    Priority: Low  . History of colonic polyps 08/23/2008    Priority: Low  . PAD (peripheral artery disease) ([place]) 12/11/2018  . Surgical counseling visit 12/10/2018  . COPD with acute exacerbation ([place]) 11/27/2018  . Stage 4 very severe COPD by GOLD classification ([place]) 11/27/2018  . Diastolic dysfunction 26/83/4196  . Syncope 11/02/2017   Past Surgical History:  Procedure Laterality Date  . APPENDECTOMY    . ENDARTERECTOMY FEMORAL Right 04/19/2019   Procedure: ENDARTERECTOMY RIGHT COMMON FEMORAL;  Surgeon: Elam Dutch, MD;  Location: [place];  Service: Vascular;  Laterality: Right;  . LOWER EXTREMITY ANGIOGRAPHY  12/11/2018  . LOWER EXTREMITY ANGIOGRAPHY N/A 12/11/2018   Procedure: LOWER EXTREMITY ANGIOGRAPHY;  Surgeon: Elam Dutch, MD;  Location: [place] [facility];  Service: Cardiovascular;  Laterality: N/A;  . PATCH ANGIOPLASTY Right 04/19/2019   Procedure: Patch Angioplasty;  Surgeon: Elam Dutch, MD;  Location: [place];  Service: Vascular;  Laterality: Right;  . PERIPHERAL VASCULAR INTERVENTION Right 12/11/2018   Procedure: PERIPHERAL VASCULAR INTERVENTION;  Surgeon: Elam Dutch, MD;  Location: [place] [facility];  Service: Cardiovascular;  Laterality: Right;  Common Iliac   . SKIN CANCER EXCISION     "lips, face, ears, arms" (04/07/2018)  . TONSILLECTOMY      Family History  Problem Relation Age of Onset  . Heart disease Mother        CABG in her 51s, nonsmoker  . Cancer Mother   . Stroke Father   . Heart disease Father        Died of MI  at age 22, smoker  . Hepatitis Sister   . Coronary artery disease Other        male 1st degree relative <60    Medications- reviewed and updated Current Outpatient Medications  Medication Sig Dispense Refill  . acetaminophen (TYLENOL) 500 MG tablet Take 1,000 mg by mouth 2 (two) times daily.     [name] Kitchen albuterol (VENTOLIN HFA) 108 (90 Base) MCG/ACT inhaler USE 2 PUFFS EVERY 6 HOURS  AS NEEDED FOR WHEEZING (Patient taking differently: Inhale 2 puffs into the lungs every 6 (six) hours as needed for wheezing or shortness of breath. ) 3 Inhaler 3  . azithromycin (ZITHROMAX) 250 MG tablet Take 1 tablet (250 mg total) by mouth every Monday, Wednesday, and Friday. 45 tablet 1  . budesonide-formoterol (SYMBICORT) 160-4.5 MCG/ACT inhaler USE 2 PUFFS BY MOUTH TWO  TIMES DAILY (Patient taking differently: Inhale 2 puffs into the lungs 2 (two) times daily. ) 30.6 g 2  . Calcium Carbonate-Vitamin D (CALCIUM 600+D PO) Take 2 tablets by mouth daily.    [name] Kitchen DALIRESP 500 MCG TABS tablet TAKE 1 TABLET BY MOUTH  DAILY (Patient taking differently: Take 500 mcg by mouth daily. ) 90 tablet 2  . fluticasone (FLONASE) 50 MCG/ACT nasal spray USE 2 SPRAYS IN EACH  NOSTRIL DAILY (Patient taking differently: Place 2 sprays into both nostrils daily. ) 48 g 1  . furosemide (LASIX) 20 MG tablet TAKE 1 TABLET BY MOUTH  DAILY 90 tablet 3  . Misc. Devices (PULSE OXIMETER FOR FINGER) MISC 1 Device by Does not apply route as needed. 1 each 0  . OXYGEN Inhale 3 L into the lungs continuous.     . predniSONE (DELTASONE) 10 MG tablet Take 1 tablet (10 mg total) by mouth daily with breakfast. 90 tablet 3  . rivaroxaban (XARELTO) 20 MG TABS tablet TAKE 1 TABLET BY MOUTH  DAILY 90 tablet 3  . SPIRIVA RESPIMAT 2.5 MCG/ACT AERS USE 2 INHALATIONS BY MOUTH  ONCE DAILY (Patient taking differently: Inhale 2 puffs into the lungs daily. ) 12 g 3  . Tetrahydrozoline HCl (REDNESS RELIEVER EYE DROPS OP) Place 1 drop into both eyes daily as needed  (redness / itching).    . traMADol (ULTRAM) 50 MG tablet Take 1 tablet (50 mg total) by mouth every 12 (twelve) hours as needed for moderate pain or severe pain. 30 tablet 1   No current facility-administered medications for this visit.     Allergies-reviewed and updated  Allergies  Allergen Reactions  . Tape Other (See Comments)    SKIN IS VERY THIN; CAN ONLY USE COBAN WRAPS DUE TO CONDITION OF SKIN!!  . Levaquin [Levofloxacin]     hallucinations    Social History   Socioeconomic History  . Marital status: Married    Spouse name: Not on file  . Number of children: 2  . Years of education: 3 years of college  . Highest education level: Not on file  Occupational History    Employer: [employer]  . Financial resource strain: Not on file  . Food insecurity    Worry: Not on file    Inability: Not on file  . Transportation needs    Medical: Not on file    Non-medical: Not on file  Tobacco Use  . Smoking status: Former Smoker    Packs/day: 2.00    Years: 52.00    Pack years: 104.00    Types: Pipe, Cigarettes    Start date: 10/06/1965    Quit date: 10/06/2017    Years since quitting: 1.6  . Smokeless tobacco: Never Used  . Tobacco comment: 04/07/2018 "smoked cigarettes years ago; stopped ~ 20 yr ago; stopped smoking pipe in 09/2017"  Substance and Sexual Activity  . Alcohol use: Yes    Alcohol/week: 28.0 standard drinks    Types: 28 Cans of beer per week    Comment: "04/15/2019 "4 beers/day; probably closer to 28/wk"  . Drug use: No  . Sexual activity: Not Currently  Lifestyle  . Physical activity    Days per week: Not on file    Minutes per session: Not on file  . Stress: Not on file  Relationships  . Social Herbalist on phone: Not on file    Gets together: Not on file    Attends religious service: Not on file    Active member of club or organization: Not on file    Attends meetings of clubs or organizations: Not on file    Relationship status:  Not on file  Other Topics Concern  . Not on file  Social History Narrative   Married (wife Katy Apo patient of Dr. Yong Channel) with children (2-son and daughter). 1 grandson.       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       Hobbies: fishing, projects at home      Objective:  BP 132/84   Pulse (!) 101   Temp 98.4 F (36.9 C) (Temporal)   Ht 6' (1.829 m)   Wt 168 lb 6.4 oz (76.4 kg)   SpO2 98% Comment: on 3 L in ofifce.  BMI 22.84 kg/m  Gen: NAD, resting comfortably HEENT: Mucous membranes are moist. Oropharynx normal Neck: no thyromegaly CV: RRR no murmurs rubs or gallops Lungs: CTAB no crackles, wheeze, rhonchi Abdomen: soft/nontender/nondistended/normal bowel sounds. No rebound or guarding.  Ext: no edema Skin: warm, dry Neuro: grossly normal, moves all extremities, PERRLA   Assessment/Plan:  AWV completed- discussed recommended screenings and documented any personalized health advice and referrals for preventive counseling. See AVS as well which was given to patient.   Recommended follow up: Return in about 6 months (around 11/30/2019) for follow up- or sooner if needed. Future Appointments  Date Time Provider [place]  08/19/2019  1:00 PM Elam Dutch, MD [facility]     Lab/Order associations:   ICD-10-CM  1. Preventative health care  Z00.00  Return precautions advised.  Garret Reddish, MD

## 2019-06-04 ENCOUNTER — Other Ambulatory Visit: Payer: Self-pay

## 2019-06-30 ENCOUNTER — Telehealth: Payer: Self-pay | Admitting: Internal Medicine

## 2019-06-30 NOTE — Telephone Encounter (Signed)
Called and spoke with pt letting him know that we needed to schedule OV to further assess and pt verbalized understanding. Pt has been scheduled for televisit tomorrow with [facility]. Nothing further needed.

## 2019-07-01 ENCOUNTER — Encounter: Payer: Self-pay | Admitting: Primary Care

## 2019-07-01 ENCOUNTER — Other Ambulatory Visit: Payer: Self-pay

## 2019-07-01 ENCOUNTER — Ambulatory Visit (INDEPENDENT_AMBULATORY_CARE_PROVIDER_SITE_OTHER): Payer: Medicare Other | Admitting: Primary Care

## 2019-07-01 DIAGNOSIS — J441 Chronic obstructive pulmonary disease with (acute) exacerbation: Secondary | ICD-10-CM

## 2019-07-01 MED ORDER — DOXYCYCLINE HYCLATE 100 MG PO TABS: 100.0000 mg | ORAL_TABLET | Freq: Two times a day (BID) | ORAL | 0 refills | Status: DC

## 2019-07-01 MED ORDER — PREDNISONE 10 MG PO TABS: ORAL_TABLET | ORAL | 0 refills | Status: DC

## 2019-07-01 NOTE — Progress Notes (Signed)
Virtual Visit via Telephone Note  I connected with Francisco [name] on 07/01/19 at  9:00 AM EST by telephone and verified that I am speaking with the correct person using two identifiers.  Location: Patient: Home Provider: Office   I discussed the limitations, risks, security and privacy concerns of performing an evaluation and management service by telephone and the availability of in person appointments. I also discussed with the patient that there may be a patient responsible charge related to this service. The patient expressed understanding and agreed to proceed.   History of Present Illness: 73 year male, former smoker quit 2019. PMH significant for severe COPD, chronic respiratory failure, chronic rhinitis, chronic pulmonary embolism, pulmonary nodule, HTN, diastolic dysfunction. Patient of [name], last seen on 05/04/19. Maintained on Symbicort 160 and Spiriva respimat. He is taking also taking Azithromycin MWF, Daliresp 500mg  daily and prednisone 10mg  daily.   07/01/2019 Patient contacted today for acute visit. Reports increased chest congestion and productive cough with clear spuitum x1 week. Associated sob and wheezing. Continue taking Symbicort and Spiriva as prescribed. He has been requiring one puff of his Albuterol inhaler three times a day. Typically he can go a few days without needed his Albuterol inhaler. Reports increased frequency of COPD exacerbations occurring every 1-2 months. He reports compliance with  Daliresp 500mg  daily, azithromycin MWF and prednisone 10mg  daily. No fever, chills, sweats, chest pain, N/V/D.    Observations/Objective:   - No significant shortness of breath, cough or wheeze noted during phone conversation - Able to speak in full sentences  Patient reported vital signs: Temp 97.9 Pule 100 O2 98% 3L 138/78   Assessment and Plan:  COPD exacerbation: - Increased cough with associated sob/wheezing x1 week - RX doxycycline 1 tab twice  daily x 7 days and prednisone taper (40mg  x 3 days; 30mg  x 3 days; 20mg  x 3 days; 10mg  x 3 days) - Advised mucinex twice daily   COPD: - Continue Symbicort 160 two puffs twice daily; Spiriva respimat once daily - Continue Albuterol hfa 2 puffs every 4-6 hours as needed for sob/wheezing - Continue Azithromycin 250mg  MWF and Daliresp 500mg  daily - Continue prednisone 10mg  daily   Follow Up Instructions:   - Follow-up if symptoms do not improve or worsen  I discussed the assessment and treatment plan with the patient. The patient was provided an opportunity to ask questions and all were answered. The patient agreed with the plan and demonstrated an understanding of the instructions.   The patient was advised to call back or seek an in-person evaluation if the symptoms worsen or if the condition fails to improve as anticipated.  I provided 22 minutes of non-face-to-face time during this encounter.   Martyn Ehrich, NP

## 2019-07-01 NOTE — Patient Instructions (Signed)
  COPD exacerbation: - Increased cough with associated sob/wheezing x1 week - RX doxycycline 1 tab twice daily x 7 days and prednisone taper (40mg  x 3 days; 30mg  x 3 days; 20mg  x 3 days; 10mg  x 3 days)  COPD - Continue Symbicort 160 two puffs twice daily - Continue Spiriva respimat 2.29mcg two puffs once daily - Continue Azithromycin 250mg  MWF - Continue Daliresp 500mg  daily - Continue prednisone 10mg  daily

## 2019-07-05 ENCOUNTER — Telehealth: Payer: Self-pay | Admitting: Internal Medicine

## 2019-07-05 NOTE — Telephone Encounter (Signed)
No medical clearance form has been received. Attempted to call Bray with [facility] but unable to reach. Left her a detailed message letting her know that this has not been received and also provided her with our fax number in case she had sent it to our old office fax. Also stated in the message to [name] to return call so we know she received the message.

## 2019-07-06 NOTE — Telephone Encounter (Signed)
This would require face to face with an app this week. HE has advnced copd

## 2019-07-06 NOTE — Telephone Encounter (Signed)
I called [facility] and [name] was not available and would return tot he office in the morning. Call back tomorrow to check on form.

## 2019-07-06 NOTE — Telephone Encounter (Signed)
Fax has been received and given to MR for him to review. Will update when form has been faxed.

## 2019-07-06 NOTE — Telephone Encounter (Signed)
Called and spoke with pt letting him know that we needed to schedule surgical clearance visit prior to his cataract surgery 1/4 and pt verbalized understanding. Pt has been scheduled appt with [facility] tomorrow 12/30 at 2pm. Nothing further needed.

## 2019-07-07 ENCOUNTER — Encounter: Payer: Self-pay | Admitting: Primary Care

## 2019-07-07 ENCOUNTER — Ambulatory Visit (INDEPENDENT_AMBULATORY_CARE_PROVIDER_SITE_OTHER): Payer: Medicare Other | Admitting: Primary Care

## 2019-07-07 ENCOUNTER — Other Ambulatory Visit: Payer: Self-pay

## 2019-07-07 ENCOUNTER — Ambulatory Visit (INDEPENDENT_AMBULATORY_CARE_PROVIDER_SITE_OTHER): Payer: Medicare Other

## 2019-07-07 VITALS — BP 110/80 | HR 86 | Temp 97.0°F | Ht 72.0 in | Wt 167.0 lb

## 2019-07-07 DIAGNOSIS — Z01811 Encounter for preprocedural respiratory examination: Secondary | ICD-10-CM | POA: Diagnosis not present

## 2019-07-07 DIAGNOSIS — J441 Chronic obstructive pulmonary disease with (acute) exacerbation: Secondary | ICD-10-CM

## 2019-07-07 DIAGNOSIS — J9611 Chronic respiratory failure with hypoxia: Secondary | ICD-10-CM | POA: Diagnosis not present

## 2019-07-07 IMAGING — DX DG CHEST 2V
2 series · 2 of 2 positions shown · non-contrast
Comparison: Chest x-ray [DATE].

CLINICAL DATA: 73-year-old male with history of COPD. Prior history
of pulmonary embolism.

EXAM:
CHEST - 2 VIEW

[chest pa]
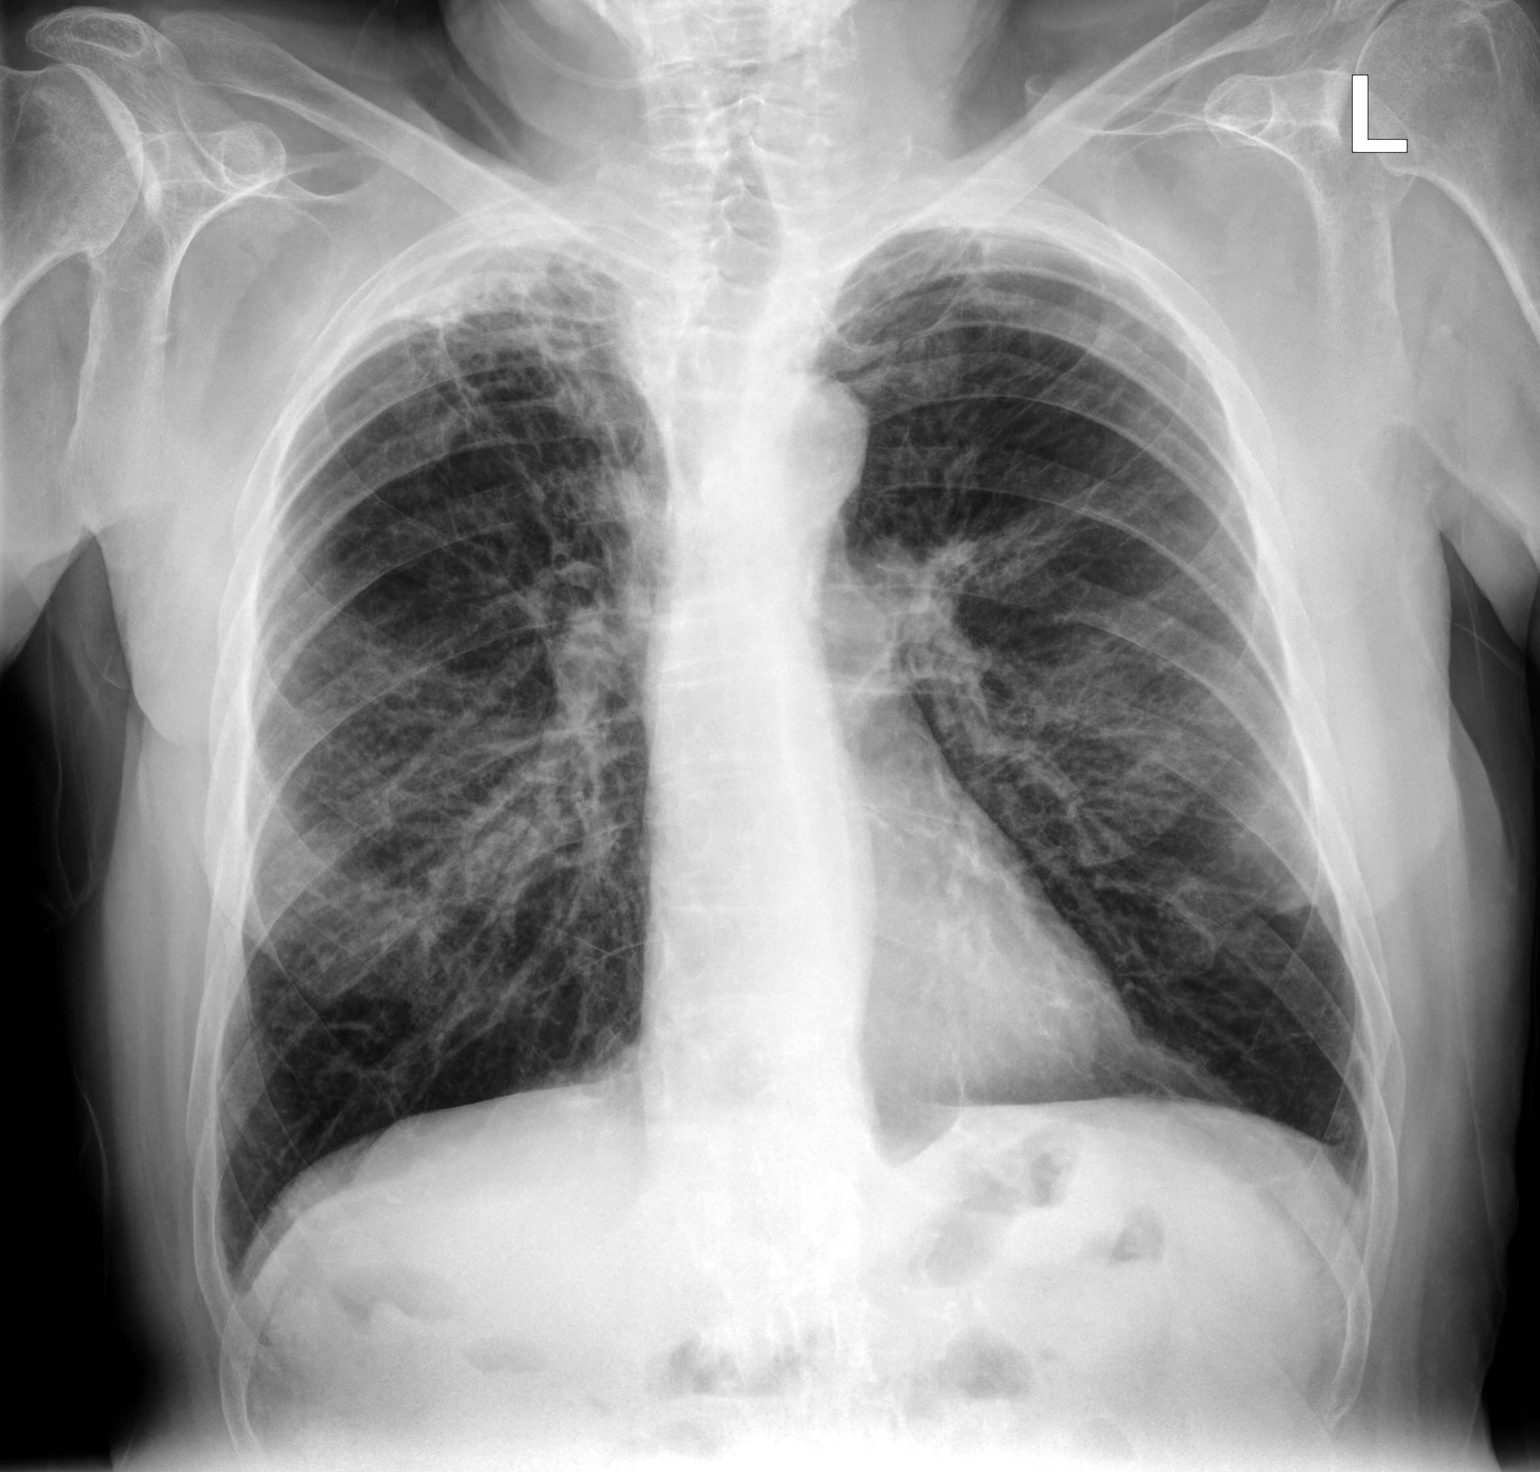

[chest lat]
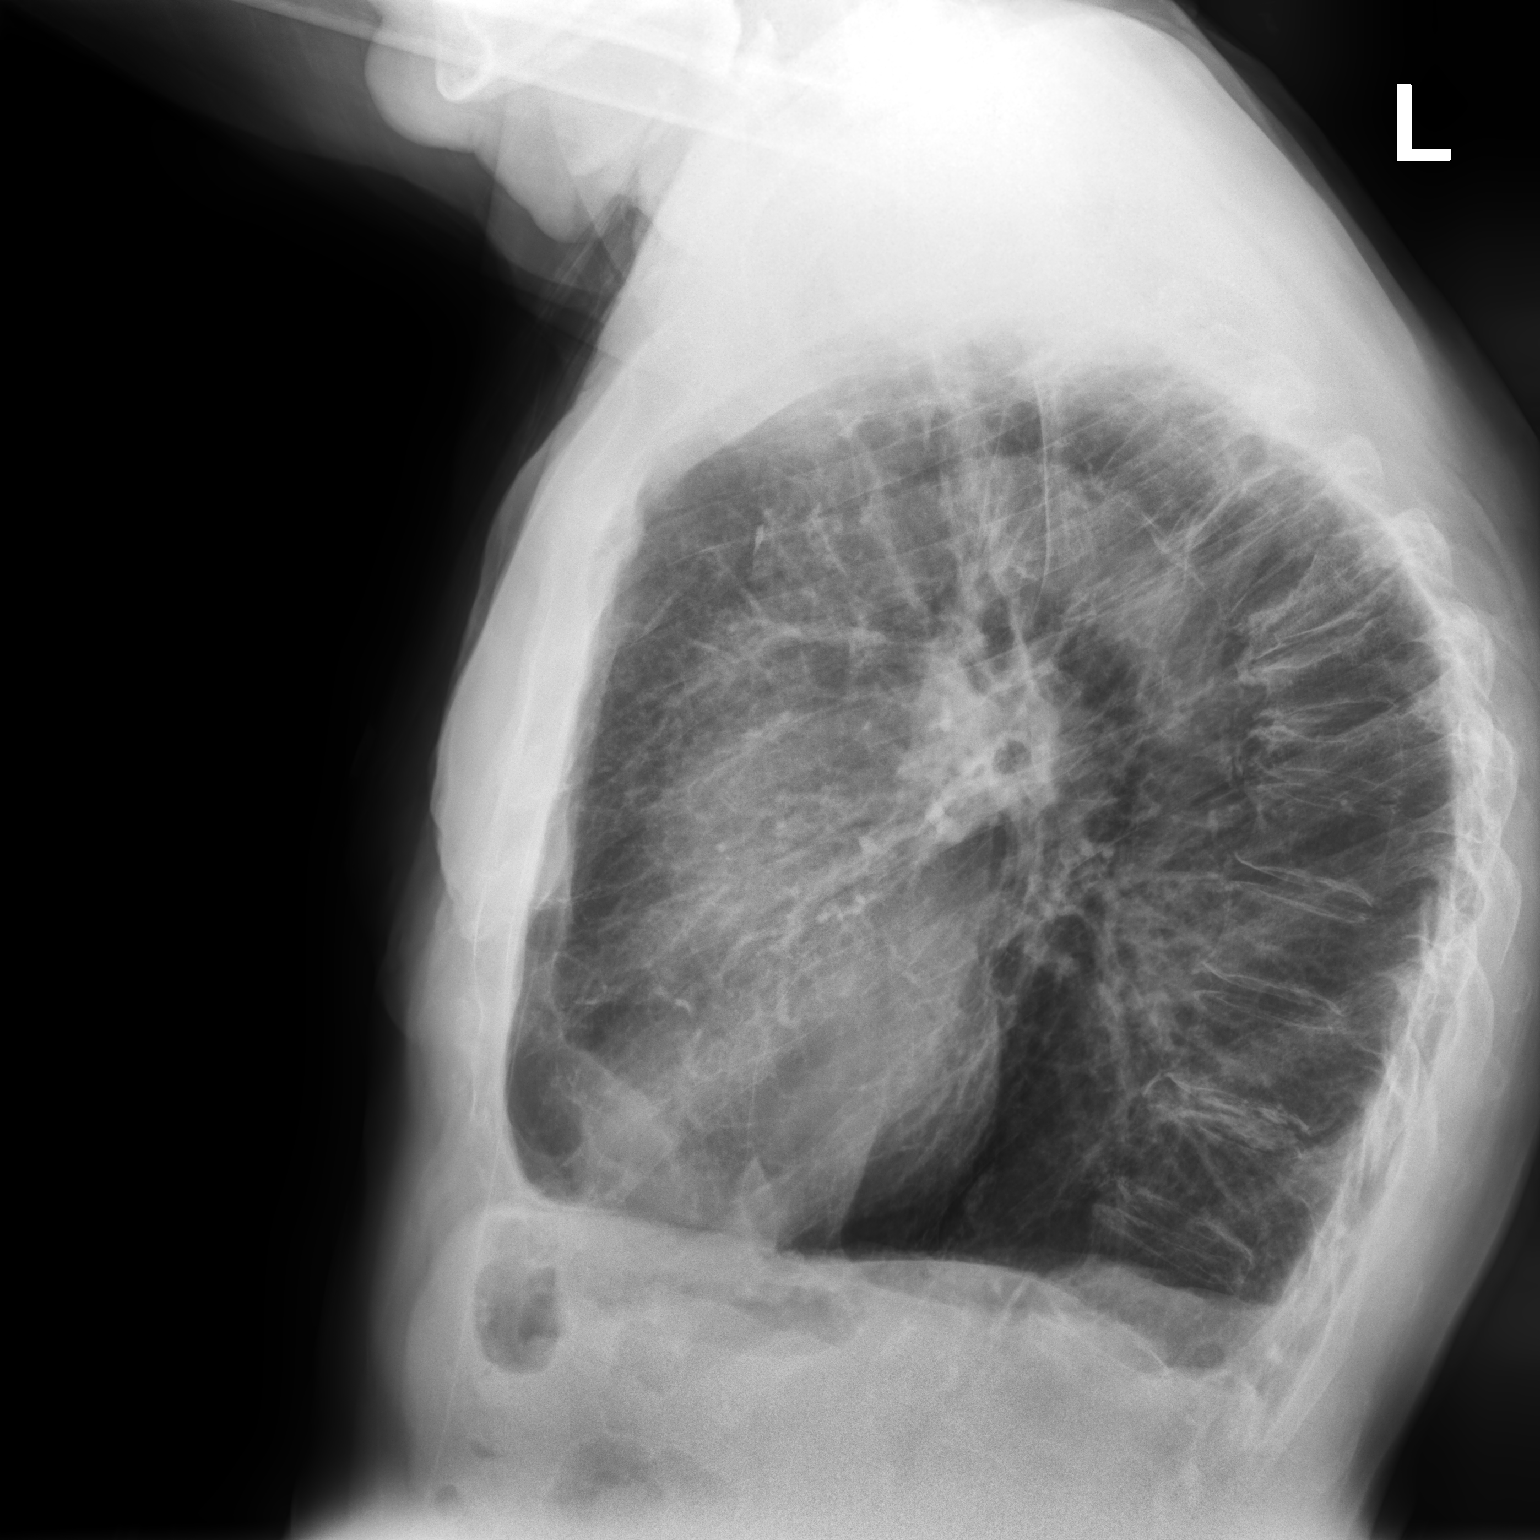

[2 of 2 positions shown; findings below may reference images not displayed]

FINDINGS: Diffuse peribronchial cuffing. Emphysematous changes. Hyperexpansion
of the lungs. Extensive bilateral apical (right greater than left)
reticulonodular architectural distortion and pleuroparenchymal
thickening, stable compared to prior studies, presumably indicative
of chronic post infectious or inflammatory scarring. No acute
consolidative airspace disease. No pleural effusions. No
pneumothorax. No definite suspicious appearing pulmonary nodules or
masses are noted. Heart size is normal. Upper mediastinal contours
are within normal limits. Aortic atherosclerosis.
IMPRESSION: 1. No radiographic evidence of acute cardiopulmonary disease.
2. Chronic peribronchial cuffing and emphysematous changes
compatible with reported clinical history of COPD.
3. Aortic atherosclerosis.
4. Extensive scarring in the apices of the lungs bilaterally (right
greater than left), similar to prior studies.

## 2019-07-07 NOTE — Progress Notes (Signed)
Called spoke with patient, advised of cxr results / recs as stated by [facility] NP.  Pt verbalized understanding and denied any questions.  Office note and cxr faxed to Dr Tama High.

## 2019-07-07 NOTE — Progress Notes (Signed)
CXR looked ok, no acute findings. Ok to clear for cataract surgery

## 2019-07-07 NOTE — Progress Notes (Signed)
@Patient  ID: Francisco [name], male    DOB: [birth year], 73 y.o.   MRN: [id number]  Chief Complaint  Patient presents with  . Follow-up    Surgical clearance    Referring provider: [name], MD  HPI: 73 year male, former smoker quit 2019. PMH significant for severe COPD, chronic respiratory failure, chronic rhinitis, chronic pulmonary embolism, pulmonary nodule, HTN, diastolic dysfunction. Patient of [name], last seen on 05/04/19. Maintained on Symbicort 160 and Spiriva respimat. He is taking also taking Azithromycin MWF, Daliresp 500mg  daily and prednisone 10mg  daily.   Previous [facility] pulmonary encounter: 07/01/2019 Patient contacted today for acute visit. Reports increased chest congestion and productive cough with clear spuitum x1 week. Associated sob and wheezing. Continue taking Symbicort and Spiriva as prescribed. He has been requiring one puff of his Albuterol inhaler three times a day. Typically he can go a few days without needed his Albuterol inhaler. Reports increased frequency of COPD exacerbations occurring every 1-2 months. He reports compliance with  Daliresp 500mg  daily, azithromycin MWF and prednisone 10mg  daily. No fever, chills, sweats, chest pain, N/V/D.    07/07/2019 Patient presents today for surgical clearance for cataract surgery. Treated for COPD exacerbation 6 days ago, he has almost completed doxycycline and prednisone taper. He is feeling much better. His breathing has improved. Reports less wheezing and cough has started to clear. Continues on 3L oxygen which is his baseline. He is able to lie flat. Denies fever, chills, chest tightness or pain.   Imaging: 04/16/19 CXR - Pulmonary hyperinflation and emphysema without acute abnormality of the lungs  07/07/2019 - CXR showed no radiographic evidence of acute cardiopulmonary disease. Chronic peribronchial cuffing and emphysematous changes compatible with reported clinical history of COPD. Extensive  scarring in the apices of the lungs bilaterally (right greater than left), similar to prior studies.  Allergies  Allergen Reactions  . Tape Other (See Comments)    SKIN IS VERY THIN; CAN ONLY USE COBAN WRAPS DUE TO CONDITION OF SKIN!!  . Levaquin [Levofloxacin]     hallucinations    Immunization History  Administered Date(s) Administered  . Fluad Quad(high Dose 65+) 03/16/2019  . Influenza Split 04/24/2012  . Influenza Whole 05/08/2009, 04/08/2011  . Influenza, High Dose Seasonal PF 05/01/2017, 03/31/2018  . Influenza,inj,Quad PF,6+ Mos 04/27/2013, 04/15/2014, 04/24/2015  . Influenza,inj,quad, With Preservative 05/01/2018  . Influenza-Unspecified 05/13/2016  . Pneumococcal Conjugate-13 02/25/2014  . Pneumococcal Polysaccharide-23 08/15/2011, 12/19/2016  . Pneumococcal-Unspecified 03/08/2017  . Td 02/21/2010  . Tdap 11/05/2017  . Zoster 01/29/2012    Past Medical History:  Diagnosis Date  . Chest x-ray abnormality   . Chronic back pain    "mid and lower" (04/07/2018)  . Chronic rhinitis    -Sinus Ct 08/01/2009 >> Bilateral maxillary sinusitis with some mucosal thickeningin the sphenoid and frontal sinuses as well with air fluid levels present -chronic rhinitis flyer Aug 04, 2009  . Compressed spine fracture ([place])   . COPD (chronic obstructive pulmonary disease) [facility])    PFT's rec Jul 17, 2009  . Dyspnea   . Emphysema lung ([place])   . Emphysema of lung ([place])   . On home oxygen therapy    "3L; 24/7" (04/07/2018)  . Onychomycosis    Dr. Judi Cong  . Orthostatic hypotension    "since 10/2017" (04/07/2018)  . PAD (peripheral artery disease) ([place])   . Pneumonia    "twice in 1 year" (04/07/2018)  . Pulmonary embolism ([place]) 04/07/2018  . Skin cancer    "  lips, face, ears, arms" (04/07/2018)  . Vertigo    "since ~ 02/2018" (04/07/2018)    Tobacco History: Social History   Tobacco Use  Smoking Status Former Smoker  . Packs/day: 2.00  . Years: 52.00  . Pack years: 104.00  . Types:  Pipe, Cigarettes  . Start date: 10/06/1965  . Quit date: 10/06/2017  . Years since quitting: 1.7  Smokeless Tobacco Never Used  Tobacco Comment   04/07/2018 "smoked cigarettes years ago; stopped ~ 20 yr ago; stopped smoking pipe in 09/2017"   Counseling given: Not Answered Comment: 04/07/2018 "smoked cigarettes years ago; stopped ~ 20 yr ago; stopped smoking pipe in 09/2017"   Outpatient Medications Prior to Visit  Medication Sig Dispense Refill  . acetaminophen (TYLENOL) 500 MG tablet Take 1,000 mg by mouth 2 (two) times daily.     [name] Kitchen albuterol (VENTOLIN HFA) 108 (90 Base) MCG/ACT inhaler USE 2 PUFFS EVERY 6 HOURS  AS NEEDED FOR WHEEZING (Patient taking differently: Inhale 2 puffs into the lungs every 6 (six) hours as needed for wheezing or shortness of breath. ) 3 Inhaler 3  . azithromycin (ZITHROMAX) 250 MG tablet Take 1 tablet (250 mg total) by mouth every Monday, Wednesday, and Friday. 45 tablet 1  . budesonide-formoterol (SYMBICORT) 160-4.5 MCG/ACT inhaler USE 2 PUFFS BY MOUTH TWO  TIMES DAILY (Patient taking differently: Inhale 2 puffs into the lungs 2 (two) times daily. ) 30.6 g 2  . Calcium Carbonate-Vitamin D (CALCIUM 600+D PO) Take 2 tablets by mouth daily.    . cycloSPORINE (RESTASIS) 0.05 % ophthalmic emulsion 1 drop 2 (two) times daily.    [name] Kitchen DALIRESP 500 MCG TABS tablet TAKE 1 TABLET BY MOUTH  DAILY (Patient taking differently: Take 500 mcg by mouth daily. ) 90 tablet 2  . doxycycline (VIBRA-TABS) 100 MG tablet Take 1 tablet (100 mg total) by mouth 2 (two) times daily. 14 tablet 0  . fluticasone (FLONASE) 50 MCG/ACT nasal spray USE 2 SPRAYS IN EACH  NOSTRIL DAILY (Patient taking differently: Place 2 sprays into both nostrils daily. ) 48 g 1  . furosemide (LASIX) 20 MG tablet TAKE 1 TABLET BY MOUTH  DAILY 90 tablet 3  . Misc. Devices (PULSE OXIMETER FOR FINGER) MISC 1 Device by Does not apply route as needed. 1 each 0  . OXYGEN Inhale 3 L into the lungs continuous.     . predniSONE  (DELTASONE) 10 MG tablet Take 1 tablet (10 mg total) by mouth daily with breakfast. 90 tablet 3  . predniSONE (DELTASONE) 10 MG tablet Take 4 tabs po daily x 3 days; then 3 tabs daily x3 days; then 2 tabs daily x3 days; then 1 tab daily x 3 days; then stop 30 tablet 0  . rivaroxaban (XARELTO) 20 MG TABS tablet TAKE 1 TABLET BY MOUTH  DAILY 90 tablet 3  . SPIRIVA RESPIMAT 2.5 MCG/ACT AERS USE 2 INHALATIONS BY MOUTH  ONCE DAILY (Patient taking differently: Inhale 2 puffs into the lungs daily. ) 12 g 3  . Tetrahydrozoline HCl (REDNESS RELIEVER EYE DROPS OP) Place 1 drop into both eyes daily as needed (redness / itching).    . traMADol (ULTRAM) 50 MG tablet Take 1 tablet (50 mg total) by mouth every 12 (twelve) hours as needed for moderate pain or severe pain. 30 tablet 1   No facility-administered medications prior to visit.   Review of Systems  Review of Systems  Constitutional: Negative.   Respiratory: Positive for cough. Negative for chest tightness, shortness  of breath and wheezing.   Cardiovascular: Positive for leg swelling.   Physical Exam  BP 110/80 (BP Location: Right Arm, Patient Position: Sitting, Cuff Size: Normal)   Pulse 86   Temp (!) 97 F (36.1 C) (Temporal)   Ht 6' (1.829 m)   Wt 167 lb (75.8 kg)   SpO2 96% Comment: O2 3L  BMI 22.65 kg/m  Physical Exam Constitutional:      Appearance: Normal appearance.  Cardiovascular:     Rate and Rhythm: Normal rate and regular rhythm.  Pulmonary:     Effort: Pulmonary effort is normal. No respiratory distress.     Breath sounds: No stridor. Wheezing present. No rhonchi.     Comments: 02 96% 3L Neurological:     Mental Status: He is alert.  Psychiatric:        Mood and Affect: Mood normal.        Behavior: Behavior normal.        Thought Content: Thought content normal.        Judgment: Judgment normal.      Lab Results:  CBC    Component Value Date/Time   WBC 10.1 06/02/2019 1105   RBC 3.90 (L) 06/02/2019 1105    HGB 12.2 (L) 06/02/2019 1105   HCT 36.8 (L) 06/02/2019 1105   PLT 311.0 06/02/2019 1105   MCV 94.2 06/02/2019 1105   MCH 33.0 04/20/2019 0229   MCHC 33.1 06/02/2019 1105   RDW 12.8 06/02/2019 1105   LYMPHSABS 0.5 (L) 06/02/2019 1105   MONOABS 0.5 06/02/2019 1105   EOSABS 0.0 06/02/2019 1105   BASOSABS 0.0 06/02/2019 1105    BMET    Component Value Date/Time   NA 138 06/02/2019 1105   K 4.8 06/02/2019 1105   CL 97 06/02/2019 1105   CO2 33 (H) 06/02/2019 1105   GLUCOSE 111 (H) 06/02/2019 1105   BUN 10 06/02/2019 1105   CREATININE 0.91 06/02/2019 1105   CALCIUM 9.9 06/02/2019 1105   GFRNONAA >60 04/20/2019 0229   GFRAA >60 04/20/2019 0229    BNP    Component Value Date/Time   BNP 83.9 11/02/2017 1957    ProBNP    Component Value Date/Time   PROBNP 50.0 04/03/2018 1148    Imaging: DG Chest 2 View  Result Date: 07/07/2019 CLINICAL DATA:  73 year old male with history of COPD. Prior history of pulmonary embolism. EXAM: CHEST - 2 VIEW COMPARISON:  Chest x-ray 04/15/2019. FINDINGS: Diffuse peribronchial cuffing. Emphysematous changes. Hyperexpansion of the lungs. Extensive bilateral apical (right greater than left) reticulonodular architectural distortion and pleuroparenchymal thickening, stable compared to prior studies, presumably indicative of chronic post infectious or inflammatory scarring. No acute consolidative airspace disease. No pleural effusions. No pneumothorax. No definite suspicious appearing pulmonary nodules or masses are noted. Heart size is normal. Upper mediastinal contours are within normal limits. Aortic atherosclerosis. IMPRESSION: 1. No radiographic evidence of acute cardiopulmonary disease. 2. Chronic peribronchial cuffing and emphysematous changes compatible with reported clinical history of COPD. 3. Aortic atherosclerosis. 4. Extensive scarring in the apices of the lungs bilaterally (right greater than left), similar to prior studies. Electronically  Signed   By: Vinnie Langton M.D.   On: 07/07/2019 14:32     Assessment & Plan:   Pre-operative respiratory examination - Cleared from pulmonary standpoint for cataract surgery - CXR 07/07/2019 showed no acute cardiopulmonary process - Exam benign   COPD with acute exacerbation ([place]) - Clinically improved  - Treated for COPD exacerbation 12/24 with Doxycycline and prednisone  -  Continue Symbicort 160 two puffs twice and Spiriva respimat daily - Continue Azithromycin MWF, Daliresp 500mg  daily and prednisone 10mg  daily   Chronic respiratory failure with hypoxia (HCC) - Stable; no increased oxygen demand - Continue 3L oxygen continuously   Martyn Ehrich, NP 07/07/2019

## 2019-07-07 NOTE — Patient Instructions (Addendum)
Recommendations: Continue inhalers as prescribed Continue Daliresp daily and azithromycin MWF Complete prednisone/doxycycline course  Orders: CXR today re: follow-up COPD exacerbation/surgical clearance  Follow-up: 2 months or sooner if symptoms flare

## 2019-07-07 NOTE — Assessment & Plan Note (Signed)
-   Cleared from pulmonary standpoint for cataract surgery - CXR 07/07/2019 showed no acute cardiopulmonary process - Exam benign

## 2019-07-07 NOTE — Assessment & Plan Note (Addendum)
-   Clinically improved  - Treated for COPD exacerbation 12/24 with Doxycycline and prednisone  - Continue Symbicort 160 two puffs twice and Spiriva respimat daily - Continue Azithromycin MWF, Daliresp 500mg  daily and prednisone 10mg  daily

## 2019-07-07 NOTE — Assessment & Plan Note (Signed)
-   Stable; no increased oxygen demand - Continue 3L oxygen continuously

## 2019-07-26 MED ORDER — AMOXICILLIN-POT CLAVULANATE 875-125 MG PO TABS: 1.0000 | ORAL_TABLET | Freq: Two times a day (BID) | ORAL | 0 refills | Status: DC

## 2019-07-26 MED ORDER — PREDNISONE 10 MG PO TABS: ORAL_TABLET | ORAL | 0 refills | Status: DC

## 2019-07-26 NOTE — Telephone Encounter (Signed)
Ok, please scheduled virtual visit on Friday to ensure she is better before cataract surgery. Hold azithromycin until after abx completed.

## 2019-07-26 NOTE — Telephone Encounter (Signed)
Called and spoke with both pt and wife letting them know Beth sent in augmentin and prednisone and stated to them while pt on augmentin to not take the azithromycin. Both verbalized understanding. Also stated that Beth wanted pt to have a virtual visit prior to next scheduled cataract surgery and an appt has been scheduled for pt with [facility] Friday at [place]. Nothing further needed.

## 2019-07-26 NOTE — Telephone Encounter (Signed)
Beth, please see pt's mychart message and advise on it for him. Thanks!

## 2019-07-30 ENCOUNTER — Encounter: Payer: Self-pay | Admitting: Primary Care

## 2019-07-30 ENCOUNTER — Ambulatory Visit: Payer: Medicare Other | Admitting: Primary Care

## 2019-07-30 ENCOUNTER — Ambulatory Visit (INDEPENDENT_AMBULATORY_CARE_PROVIDER_SITE_OTHER): Payer: Medicare Other | Admitting: Primary Care

## 2019-07-30 ENCOUNTER — Other Ambulatory Visit: Payer: Self-pay

## 2019-07-30 DIAGNOSIS — J441 Chronic obstructive pulmonary disease with (acute) exacerbation: Secondary | ICD-10-CM

## 2019-07-30 MED ORDER — ALBUTEROL SULFATE HFA 108 (90 BASE) MCG/ACT IN AERS: INHALATION_SPRAY | RESPIRATORY_TRACT | 3 refills | Status: DC

## 2019-07-30 NOTE — Patient Instructions (Addendum)
  COPD exacerbation - Continue Doxycycline and prednisone taper as prescribed until completed  - As long as you continue to feel well, cleared for second cartact surgery on Monday 1/25  COPD - Continue Azithromycin MWF after completing Doxycycline course - Continue Daliresp 500mg  once daily and Prednisone 10mg  daily - Continue Symbicort 160 two puffs twice daily; Spiriva Respimat once daily   Follow-up Due for 2-3 month regular visit in March/April with [name] CAT score at followup

## 2019-07-30 NOTE — Progress Notes (Signed)
Virtual Visit via Telephone Note  I connected with Francisco [name] on 07/30/19 at  2:30 PM EST by telephone and verified that I am speaking with the correct person using two identifiers.  Location: Patient: Home Provider: Office    I discussed the limitations, risks, security and privacy concerns of performing an evaluation and management service by telephone and the availability of in person appointments. I also discussed with the patient that there may be a patient responsible charge related to this service. The patient expressed understanding and agreed to proceed.   History of Present Illness: 74 year male, former smoker quit 2019. PMH significant for severe COPD, chronic respiratory failure, chronic rhinitis, chronic pulmonary embolism, pulmonary nodule, HTN, diastolic dysfunction. Patient of [name], last seen on 05/04/19. Maintained on Symbicort 160 and Spiriva respimat. He is taking also taking Azithromycin MWF, Daliresp 500mg  daily and prednisone 10mg  daily.   Previous [facility] pulmonary encounter: 07/01/2019 Patient contacted today for acute visit. Reports increased chest congestion and productive cough with clear spuitum x1 week. Associated sob and wheezing. Continue taking Symbicort and Spiriva as prescribed. He has been requiring one puff of his Albuterol inhaler three times a day. Typically he can go a few days without needed his Albuterol inhaler. Reports increased frequency of COPD exacerbations occurring every 1-2 months. He reports compliance with  Daliresp 500mg  daily, azithromycin MWF and prednisone 10mg  daily. No fever, chills, sweats, chest pain, N/V/D.    07/07/2019 Patient presents today for surgical clearance for cataract surgery. Treated for COPD exacerbation 6 days ago, he has almost completed doxycycline and prednisone taper. He is feeling much better. His breathing has improved. Reports less wheezing and cough has started to clear. Continues on 3L oxygen which is his  baseline. He is able to lie flat. Denies fever, chills, chest tightness or pain.   07/30/2019 Patient contacted today for follow-up televist for COPD exacerbation. He was treated earlier this week for increased congested cough with doxycycline and prednisone taper. He is doing better today. Chest congested has begun to break up and he was able to cough up some mucus today which improved his breathing. He had left eye catartact surgery last month and tolerated procedure with no complications. He is planning to have his right eye done on Monday 08/02/19. Needs refill Albuterol hfa sent to [facility]. He had his first COVID vaccine today.    Observations/Objective:  - No observed shortness of breath, wheezing or cough noted during phone conversation - Able to speak in full sentences  Imaging: 04/16/19 CXR - Pulmonary hyperinflation and emphysema without acute abnormality of the lungs  07/07/2019 - CXR showed no radiographic evidence of acute cardiopulmonary disease. Chronic peribronchial cuffing and emphysematous changes compatible with reported clinical history of COPD. Extensive scarring in the apices of the lungs bilaterally (right greater than left), similar to prior studies.  Assessment and Plan:  COPD exacerbation: - Improving with current doxycycline course and prednisone taper - CXR 07/07/2019 showed no acute cardiopulmonary process - As long as he continues to feel well, cleared for second cartact surgery on Monday 1/25  COPD: - Continue Azithromycin MWF after completing Doxycycline course - Continue Daliresp 500mg  once daily and Prednisone 10mg  daily - Continue Symbicort 160 two puffs twice daily; Spiriva Respimat once daily   Chronic respiratory failure: - Stable, no increased oxygen demand. Continue 3L [place]  Follow Up Instructions:   - Due for 2-3 month regular visit in March/April with [name] CAT score at followup  I discussed the assessment and treatment plan with the  patient. The patient was provided an opportunity to ask questions and all were answered. The patient agreed with the plan and demonstrated an understanding of the instructions.   The patient was advised to call back or seek an in-person evaluation if the symptoms worsen or if the condition fails to improve as anticipated.  I provided 18 minutes of non-face-to-face time during this encounter.   Martyn Ehrich, NP

## 2019-08-02 MED ORDER — DALIRESP 500 MCG PO TABS: 500.0000 ug | ORAL_TABLET | Freq: Every day | ORAL | 2 refills | Status: DC

## 2019-08-03 ENCOUNTER — Telehealth: Payer: Self-pay | Admitting: Internal Medicine

## 2019-08-03 NOTE — Telephone Encounter (Signed)
Patient did have the cataract surgery 1/4 after having a preop clearance visit with Derl Barrow prior. Pt has also had a second cataract surgery after seeing Beth again prior to that. Nothing further needed.

## 2019-08-03 NOTE — Telephone Encounter (Signed)
Did he end up having his cataract surgery on 07/12/2019?

## 2019-08-06 ENCOUNTER — Telehealth: Payer: Self-pay | Admitting: Emergency Medicine

## 2019-08-06 NOTE — Telephone Encounter (Signed)
PA request received from [facility]  Drug requested: Daliresp 50 MCG CMM Key: [id number] Tried/failed: Symbicort 160, Breo 200, Advair Diskus 250, Trelegy 100, Dulera 200, Spiriva Handihaler, Spiriva Respimat 2.5 Covered alternatives:  PA request has been sent to plan, and a determination is expected within 1-3 days.   Routing to [address] for follow-up.

## 2019-08-09 NOTE — Telephone Encounter (Signed)
Approvedon January 29 Request Reference Number: [id number]. DALIRESP TAB 500MCG is approved through 07/07/2020. Your patient may now fill this prescription and it will be covered.

## 2019-08-19 ENCOUNTER — Ambulatory Visit: Payer: Medicare Other | Admitting: Vascular Surgery

## 2019-08-22 ENCOUNTER — Ambulatory Visit: Payer: Medicare Other

## 2019-08-26 ENCOUNTER — Ambulatory Visit: Payer: Medicare Other | Admitting: Vascular Surgery

## 2019-08-30 ENCOUNTER — Other Ambulatory Visit: Payer: Self-pay | Admitting: Family Medicine

## 2019-08-30 ENCOUNTER — Encounter: Payer: Self-pay | Admitting: Family Medicine

## 2019-08-31 ENCOUNTER — Other Ambulatory Visit: Payer: Self-pay

## 2019-08-31 MED ORDER — FLUTICASONE PROPIONATE 50 MCG/ACT NA SUSP: 2.0000 | Freq: Every day | NASAL | 3 refills | Status: DC

## 2019-09-05 NOTE — Progress Notes (Signed)
'@Patient'  ID: Francisco [name], male    DOB: 1946/01/09, 74 y.o.   MRN: [id number]  Chief Complaint  Patient presents with  . Follow-up    COPD flare up, increased SOB and productive cough with yellowish phlegm. Increased fatigue. On and off for the past 3 weeks.     Referring provider: [name], MD  HPI:  74 year old male former smoker followed in our office for COPD  PMH: Hypertension, osteoporosis, chronic PE Smoker/ Smoking History: Former smoker.  Quit smoking in 2019.  104-pack-year smoking history. Maintenance: Symbicort 160, Spiriva Respimat 2.5, 10 mg prednisone daily, daliresp, Azithro MWF  Pt of: [name]  09/06/2019  - Visit   74 year old male former smoker followed in our office for COPD.  Patient has history of frequent exacerbations.  He is maintained on Symbicort 160, Spiriva Respimat 2.5, daily prednisone 10 mg, Daliresp and azithromycin.   Patient presenting today for an in person office visit.  He was last seen telephonically in January/2021.  Patient was found to be at a stable point, recovering from a COPD exacerbation late December/2020.  Patient was treated with doxycycline and a course of prednisone at that time.  Patient has since then had cataract surgery.  Patient reporting today that he has had worsening symptoms over the last 3 weeks.  Increased cough, congestion, tan to white mucus.  More mucus production than usual.  This is despite his maintenance regimen of inhalers, daily prednisone at 10 mg, Daliresp and Monday Wednesday Friday azithromycin.  He has not had another antibiotic since doxycycline in December/2020.  Patient has had increased oxygen needs with physical exertion over the last week.  When he gets up and moves around his house oxygen levels dropped to 86 to 87% despite 3 L continuous.  Spouse reports that when he takes a bath or has significant physical exertion she increases his oxygen 3-1/2 L he tolerates this.  Last pulmonary  function testing is from 2011.   Questionaires / Pulmonary Flowsheets:   MMRC: mMRC Dyspnea Scale mMRC Score  09/06/2019 2    Tests:    04/15/2019 - EKG - QTC - 424  04/07/2018-CTA- nonocclusive PE centrally involving branches of the right middle lobe and right lower lobe, no evidence of right heart strain  11/04/2017-echocardiogram-LV ejection fraction of 65 to 57%, grade 1 diastolic dysfunction  8/46/9629-[id number] function test- FVC 4.59 (92% predicted), postbronchodilator ratio 29, postbronchodilator FEV1 1.41 (40% predicted), DLCO 72  11/27/2018-chest x-ray- no active cardiopulmonary disease, COPD  04/15/2019-chest x-ray-pulmonary hyperinflation emphysema without acute abnormality of the lungs  07/07/2019-chest x-ray-no radiographic evidence of acute cardiopulmonary disease, chronic peribronchial cuffing and emphysematous changes compatible with COPD, extensive scarring in the apices of the lungs bilaterally, right greater than left, aortic arthrosclerosis  FENO:  No results found for: NITRICOXIDE  PFT: No flowsheet data found.  WALK:  SIX MIN WALK 06/06/2015 06/28/2014 09/30/2011  Supplimental Oxygen during Test? (L/min) No No No  Tech Comments: - - test completed without difficulty/mmr    Imaging: No results found.  Lab Results:  CBC    Component Value Date/Time   WBC 10.1 06/02/2019 1105   RBC 3.90 (L) 06/02/2019 1105   HGB 12.2 (L) 06/02/2019 1105   HCT 36.8 (L) 06/02/2019 1105   PLT 311.0 06/02/2019 1105   MCV 94.2 06/02/2019 1105   MCH 33.0 04/20/2019 0229   MCHC 33.1 06/02/2019 1105   RDW 12.8 06/02/2019 1105   LYMPHSABS 0.5 (L) 06/02/2019 1105  MONOABS 0.5 06/02/2019 1105   EOSABS 0.0 06/02/2019 1105   BASOSABS 0.0 06/02/2019 1105    BMET    Component Value Date/Time   NA 138 06/02/2019 1105   K 4.8 06/02/2019 1105   CL 97 06/02/2019 1105   CO2 33 (H) 06/02/2019 1105   GLUCOSE 111 (H) 06/02/2019 1105   BUN 10 06/02/2019 1105   CREATININE  0.91 06/02/2019 1105   CALCIUM 9.9 06/02/2019 1105   GFRNONAA >60 04/20/2019 0229   GFRAA >60 04/20/2019 0229    BNP    Component Value Date/Time   BNP 83.9 11/02/2017 1957    ProBNP    Component Value Date/Time   PROBNP 50.0 04/03/2018 1148    Specialty Problems      Pulmonary Problems   COPD (chronic obstructive pulmonary disease) with emphysema (HCC)     PFT's 09/2009:  FEV1 1.20 (34%), ratio 26, increase 17% with BD, +++airtrapping, DLCO 72% Suspect due to emphysema and asthma AAT level 2012:  Normal level, M? (unidentified allelle on testing).   Referred to pulmonary rehab 08/2011. Sinusitis often flares underlying lung disease.  ++response to singulair trial Unable to tolerate daliresp Trial of azithro MWF 05/2014 >> pt thinks this has helped him Continued pipe smoking >> significant inhalation per patient. Nuclear stress test 2013 normal, echo ok.  Ambulatory ox 06/2014:  No desat below 90%.  Low dose chronic prednisone started 05/2014       Pulmonary nodule    Ct chest 2013:  Multiple bilat pulmonary nodules. CT chest 02/2012:  Significant decrease in size of RUL nodule, LLL nodule unchanged.  Needs f/u 02/2013.  CT chest 05/2013:  No nodules seen.       Chronic rhinitis    F/u [facility]/ ENT - Sinus  CT 08/01/09 Bilateral maxillary sinusitis with some mucosal thickening in the  sphenoid and frontal sinuses as well.       Chronic respiratory failure with hypoxia (HCC)   COPD with acute exacerbation (HCC)   Stage 4 very severe COPD by GOLD classification (HCC)      Allergies  Allergen Reactions  . Tape Other (See Comments)    SKIN IS VERY THIN; CAN ONLY USE COBAN WRAPS DUE TO CONDITION OF SKIN!!  . Levaquin [Levofloxacin]     hallucinations    Immunization History  Administered Date(s) Administered  . Fluad Quad(high Dose 65+) 03/16/2019  . Influenza Split 04/24/2012  . Influenza Whole 05/08/2009, 04/08/2011  . Influenza, High Dose Seasonal PF  05/01/2017, 03/31/2018  . Influenza,inj,Quad PF,6+ Mos 04/27/2013, 04/15/2014, 04/24/2015  . Influenza,inj,quad, With Preservative 05/01/2018  . Influenza-Unspecified 05/13/2016  . Pneumococcal Conjugate-13 02/25/2014  . Pneumococcal Polysaccharide-23 08/15/2011, 12/19/2016  . Pneumococcal-Unspecified 03/08/2017  . Td 02/21/2010  . Tdap 11/05/2017  . Zoster 01/29/2012    Past Medical History:  Diagnosis Date  . Chest x-ray abnormality   . Chronic back pain    "mid and lower" (04/07/2018)  . Chronic rhinitis    -Sinus Ct 08/01/2009 >> Bilateral maxillary sinusitis with some mucosal thickeningin the sphenoid and frontal sinuses as well with air fluid levels present -chronic rhinitis flyer Aug 04, 2009  . Compressed spine fracture ([place])   . COPD (chronic obstructive pulmonary disease) [facility])    PFT's rec Jul 17, 2009  . Dyspnea   . Emphysema lung ([place])   . Emphysema of lung ([place])   . On home oxygen therapy    "3L; 24/7" (04/07/2018)  . Onychomycosis    Dr. Judi Cong  .  Orthostatic hypotension    "since 10/2017" (04/07/2018)  . PAD (peripheral artery disease) ([place])   . Pneumonia    "twice in 1 year" (04/07/2018)  . Pulmonary embolism ([place]) 04/07/2018  . Skin cancer    "lips, face, ears, arms" (04/07/2018)  . Vertigo    "since ~ 02/2018" (04/07/2018)    Tobacco History: Social History   Tobacco Use  Smoking Status Former Smoker  . Packs/day: 2.00  . Years: 52.00  . Pack years: 104.00  . Types: Pipe, Cigarettes  . Start date: 10/06/1965  . Quit date: 10/06/2017  . Years since quitting: 1.9  Smokeless Tobacco Never Used  Tobacco Comment   04/07/2018 "smoked cigarettes years ago; stopped ~ 20 yr ago; stopped smoking pipe in 09/2017"   Counseling given: Yes Comment: 04/07/2018 "smoked cigarettes years ago; stopped ~ 20 yr ago; stopped smoking pipe in 09/2017"  Continue to not smoke  Outpatient Encounter Medications as of 09/06/2019  Medication Sig  . acetaminophen (TYLENOL) 500 MG  tablet Take 1,000 mg by mouth 2 (two) times daily.   [name] Kitchen albuterol (VENTOLIN HFA) 108 (90 Base) MCG/ACT inhaler USE 2 PUFFS EVERY 6 HOURS  AS NEEDED FOR WHEEZING  . azithromycin (ZITHROMAX) 250 MG tablet Take 1 tablet (250 mg total) by mouth every Monday, Wednesday, and Friday.  . budesonide-formoterol (SYMBICORT) 160-4.5 MCG/ACT inhaler Inhale 2 puffs into the lungs 2 (two) times daily.  . Calcium Carbonate-Vitamin D (CALCIUM 600+D PO) Take 2 tablets by mouth daily.  . cycloSPORINE (RESTASIS) 0.05 % ophthalmic emulsion 1 drop 2 (two) times daily.  . fluticasone (FLONASE) 50 MCG/ACT nasal spray Place 2 sprays into both nostrils daily.  . furosemide (LASIX) 20 MG tablet TAKE 1 TABLET BY MOUTH  DAILY  . Misc. Devices (PULSE OXIMETER FOR FINGER) MISC 1 Device by Does not apply route as needed.  . OXYGEN Inhale 3 L into the lungs continuous.   . predniSONE (DELTASONE) 10 MG tablet Take 1 tablet (10 mg total) by mouth daily with breakfast.  . rivaroxaban (XARELTO) 20 MG TABS tablet TAKE 1 TABLET BY MOUTH  DAILY  . roflumilast (DALIRESP) 500 MCG TABS tablet Take 1 tablet (500 mcg total) by mouth daily.  [name] Kitchen SPIRIVA RESPIMAT 2.5 MCG/ACT AERS USE 2 INHALATIONS BY MOUTH  ONCE DAILY (Patient taking differently: Inhale 2 puffs into the lungs daily. )  . Tetrahydrozoline HCl (REDNESS RELIEVER EYE DROPS OP) Place 1 drop into both eyes daily as needed (redness / itching).  . traMADol (ULTRAM) 50 MG tablet Take 1 tablet (50 mg total) by mouth every 12 (twelve) hours as needed for moderate pain or severe pain.  [name] Kitchen amoxicillin-clavulanate (AUGMENTIN) 875-125 MG tablet Take 1 tablet by mouth 2 (two) times daily.  . predniSONE (DELTASONE) 10 MG tablet Take 4 tablets (40 mg total) by mouth daily with breakfast for 3 days, THEN 3 tablets (30 mg total) daily with breakfast for 3 days, THEN 2 tablets (20 mg total) daily with breakfast for 3 days, THEN 1 tablet (10 mg total) daily with breakfast for 3 days.  . [DISCONTINUED]  amoxicillin-clavulanate (AUGMENTIN) 875-125 MG tablet Take 1 tablet by mouth 2 (two) times daily.  . [DISCONTINUED] predniSONE (DELTASONE) 10 MG tablet Take 4 tabs po daily x 2 days; then 3 tabs for 2 days; then 2 tabs for 2 days; then 1 tab for 2 days   No facility-administered encounter medications on file as of 09/06/2019.     Review of Systems  Review of Systems  Constitutional: Positive for fatigue. Negative for activity change, diaphoresis and fever.  HENT: Positive for congestion and postnasal drip. Negative for sneezing, sore throat and trouble swallowing.   Eyes: Negative.   Respiratory: Positive for cough and shortness of breath. Negative for wheezing.   Cardiovascular: Negative for chest pain and palpitations.  Gastrointestinal: Negative for diarrhea, nausea and vomiting.  Endocrine: Negative.   Genitourinary: Negative.   Musculoskeletal: Negative.  Negative for arthralgias.  Skin: Negative.   Allergic/Immunologic: Negative.   Neurological: Negative.   Hematological: Negative.   Psychiatric/Behavioral: Negative.  Negative for dysphoric mood. The patient is not nervous/anxious.      Physical Exam  BP 120/72 (BP Location: Right Arm, Patient Position: Sitting, Cuff Size: Normal)   Pulse (!) 110   Temp (!) 97.3 F (36.3 C) (Temporal)   Ht 6' (1.829 m)   Wt 165 lb (74.8 kg)   SpO2 99% Comment: on 3L pulse  BMI 22.38 kg/m   Hx of tachycardia   Wt Readings from Last 5 Encounters:  09/06/19 165 lb (74.8 kg)  07/07/19 167 lb (75.8 kg)  06/02/19 168 lb 6.4 oz (76.4 kg)  05/13/19 166 lb (75.3 kg)  05/04/19 166 lb (75.3 kg)    BMI Readings from Last 5 Encounters:  09/06/19 22.38 kg/m  07/07/19 22.65 kg/m  06/02/19 22.84 kg/m  05/13/19 22.51 kg/m  05/04/19 22.51 kg/m     Physical Exam Vitals and nursing note reviewed.  Constitutional:      General: He is not in acute distress.    Appearance: Normal appearance. He is normal weight.     Comments: Frail  chronically ill elderly male  HENT:     Head: Normocephalic and atraumatic.     Right Ear: Hearing, tympanic membrane, ear canal and external ear normal.     Left Ear: Hearing, tympanic membrane, ear canal and external ear normal.     Nose: Rhinorrhea present. No mucosal edema.     Right Turbinates: Not enlarged.     Left Turbinates: Not enlarged.     Mouth/Throat:     Mouth: Mucous membranes are dry.     Pharynx: Oropharynx is clear. No oropharyngeal exudate.  Eyes:     Pupils: Pupils are equal, round, and reactive to light.  Cardiovascular:     Rate and Rhythm: Regular rhythm. Tachycardia present.     Pulses: Normal pulses.     Heart sounds: Normal heart sounds. No murmur.  Pulmonary:     Effort: Pulmonary effort is normal.     Breath sounds: Wheezing (exp wheeze ) present. No decreased breath sounds or rales.  Musculoskeletal:        General: Swelling (LE swelling bilaterally ) present.     Cervical back: Normal range of motion.     Right lower leg: Edema (3-4+) present.     Left lower leg: Edema (3-4+) present.     Comments: Patient and spouse report that lower extremity swelling is at baseline if not improved  Lymphadenopathy:     Cervical: No cervical adenopathy.  Skin:    General: Skin is warm and dry.     Capillary Refill: Capillary refill takes less than 2 seconds.     Findings: No erythema or rash.  Neurological:     General: No focal deficit present.     Mental Status: He is alert and oriented to person, place, and time.     Motor: No weakness.     Coordination: Coordination normal.  Gait: Gait is intact. Gait normal.  Psychiatric:        Mood and Affect: Mood normal.        Behavior: Behavior normal. Behavior is cooperative.        Thought Content: Thought content normal.        Judgment: Judgment normal.       Assessment & Plan:   Chronic pulmonary embolism (HCC) Plan: Continue Xarelto  Chronic respiratory failure with hypoxia  (HCC) Plan: Continue oxygen therapy 3 L, okay to increase to 3-1/2 L with physical exertion Goal oxygen saturation should be 88 to 92% Follow-up in 2 weeks, with walk We will defer walk today due to persistent COPD exacerbation symptoms  COPD with acute exacerbation (HCC) Last treated for COPD exacerbation on 07/01/2019 with doxycycline and prednisone History of frequent exacerbations 2 to 3 weeks of increased cough, congestion and wheezing Reports adherence to Symbicort 160, Spiriva Respimat 2.5, Monday Wednesday Friday azithromycin, Daliresp and prednisone 10 mg daily  Plan: Sputum culture today Chest x-ray today We will treat with Augmentin May need to add second antibiotic based off of x-ray results Prednisone taper, then resume daily prednisone dose of 10 mg After antibiotic course resume Monday Wednesday Friday azithromycin 2-week follow-up with our office  Stage 4 very severe COPD by GOLD classification ([place]) Plan: Prednisone taper today as outlined, then resume 10 mg of prednisone daily Augmentin today, then resume Monday Wednesday Friday azithromycin Continue Symbicort 160 Continue Daliresp Continue Spiriva Respimat 2.5 2-week follow-up Offered palliative care referral today, patient and spouse will think about it Offered physical therapy or pulmonary rehab referral today, patient spouse will think about it    Return in about 2 weeks (around 09/20/2019), or if symptoms worsen or fail to improve, for Follow up with [name] FNP-C, Follow up with Dr. Purnell Shoemaker.   Lauraine Rinne, NP 09/06/2019   This appointment required 35 minutes of patient care (this includes precharting, chart review, review of results, face-to-face care, etc.).

## 2019-09-06 ENCOUNTER — Encounter: Payer: Self-pay | Admitting: Pulmonary Disease

## 2019-09-06 ENCOUNTER — Ambulatory Visit (INDEPENDENT_AMBULATORY_CARE_PROVIDER_SITE_OTHER): Payer: Medicare Other | Admitting: Pulmonary Disease

## 2019-09-06 ENCOUNTER — Ambulatory Visit (INDEPENDENT_AMBULATORY_CARE_PROVIDER_SITE_OTHER): Payer: Medicare Other

## 2019-09-06 ENCOUNTER — Other Ambulatory Visit: Payer: Self-pay

## 2019-09-06 VITALS — BP 120/72 | HR 110 | Temp 97.3°F | Ht 72.0 in | Wt 165.0 lb

## 2019-09-06 DIAGNOSIS — J441 Chronic obstructive pulmonary disease with (acute) exacerbation: Secondary | ICD-10-CM | POA: Diagnosis not present

## 2019-09-06 DIAGNOSIS — J449 Chronic obstructive pulmonary disease, unspecified: Secondary | ICD-10-CM

## 2019-09-06 DIAGNOSIS — J9611 Chronic respiratory failure with hypoxia: Secondary | ICD-10-CM

## 2019-09-06 DIAGNOSIS — I2782 Chronic pulmonary embolism: Secondary | ICD-10-CM

## 2019-09-06 IMAGING — DX DG CHEST 2V
2 series · 2 of 2 positions shown · non-contrast
Comparison: [DATE], [DATE]

CLINICAL DATA: Wheezing, COPD

EXAM:
CHEST - 2 VIEW

[chest pa]
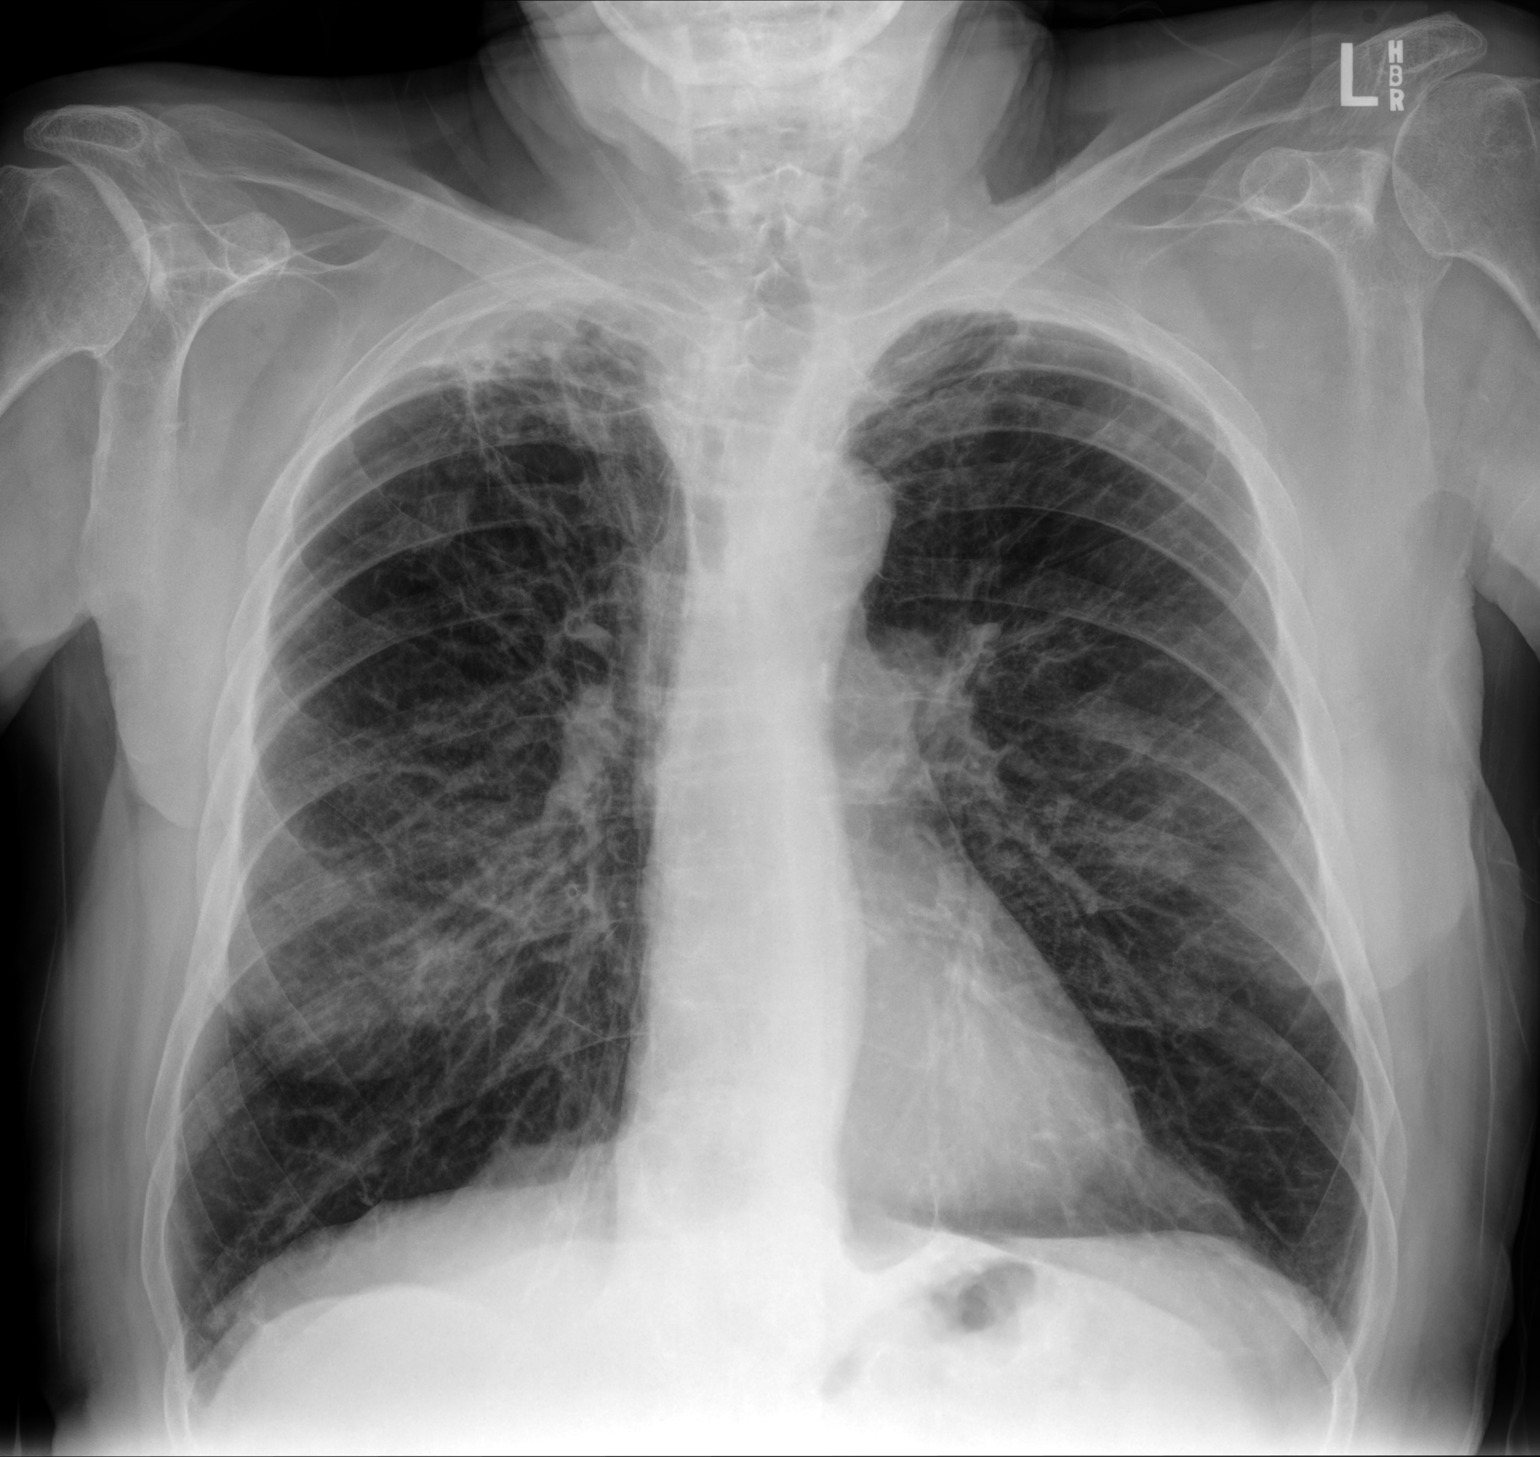

[chest lat]
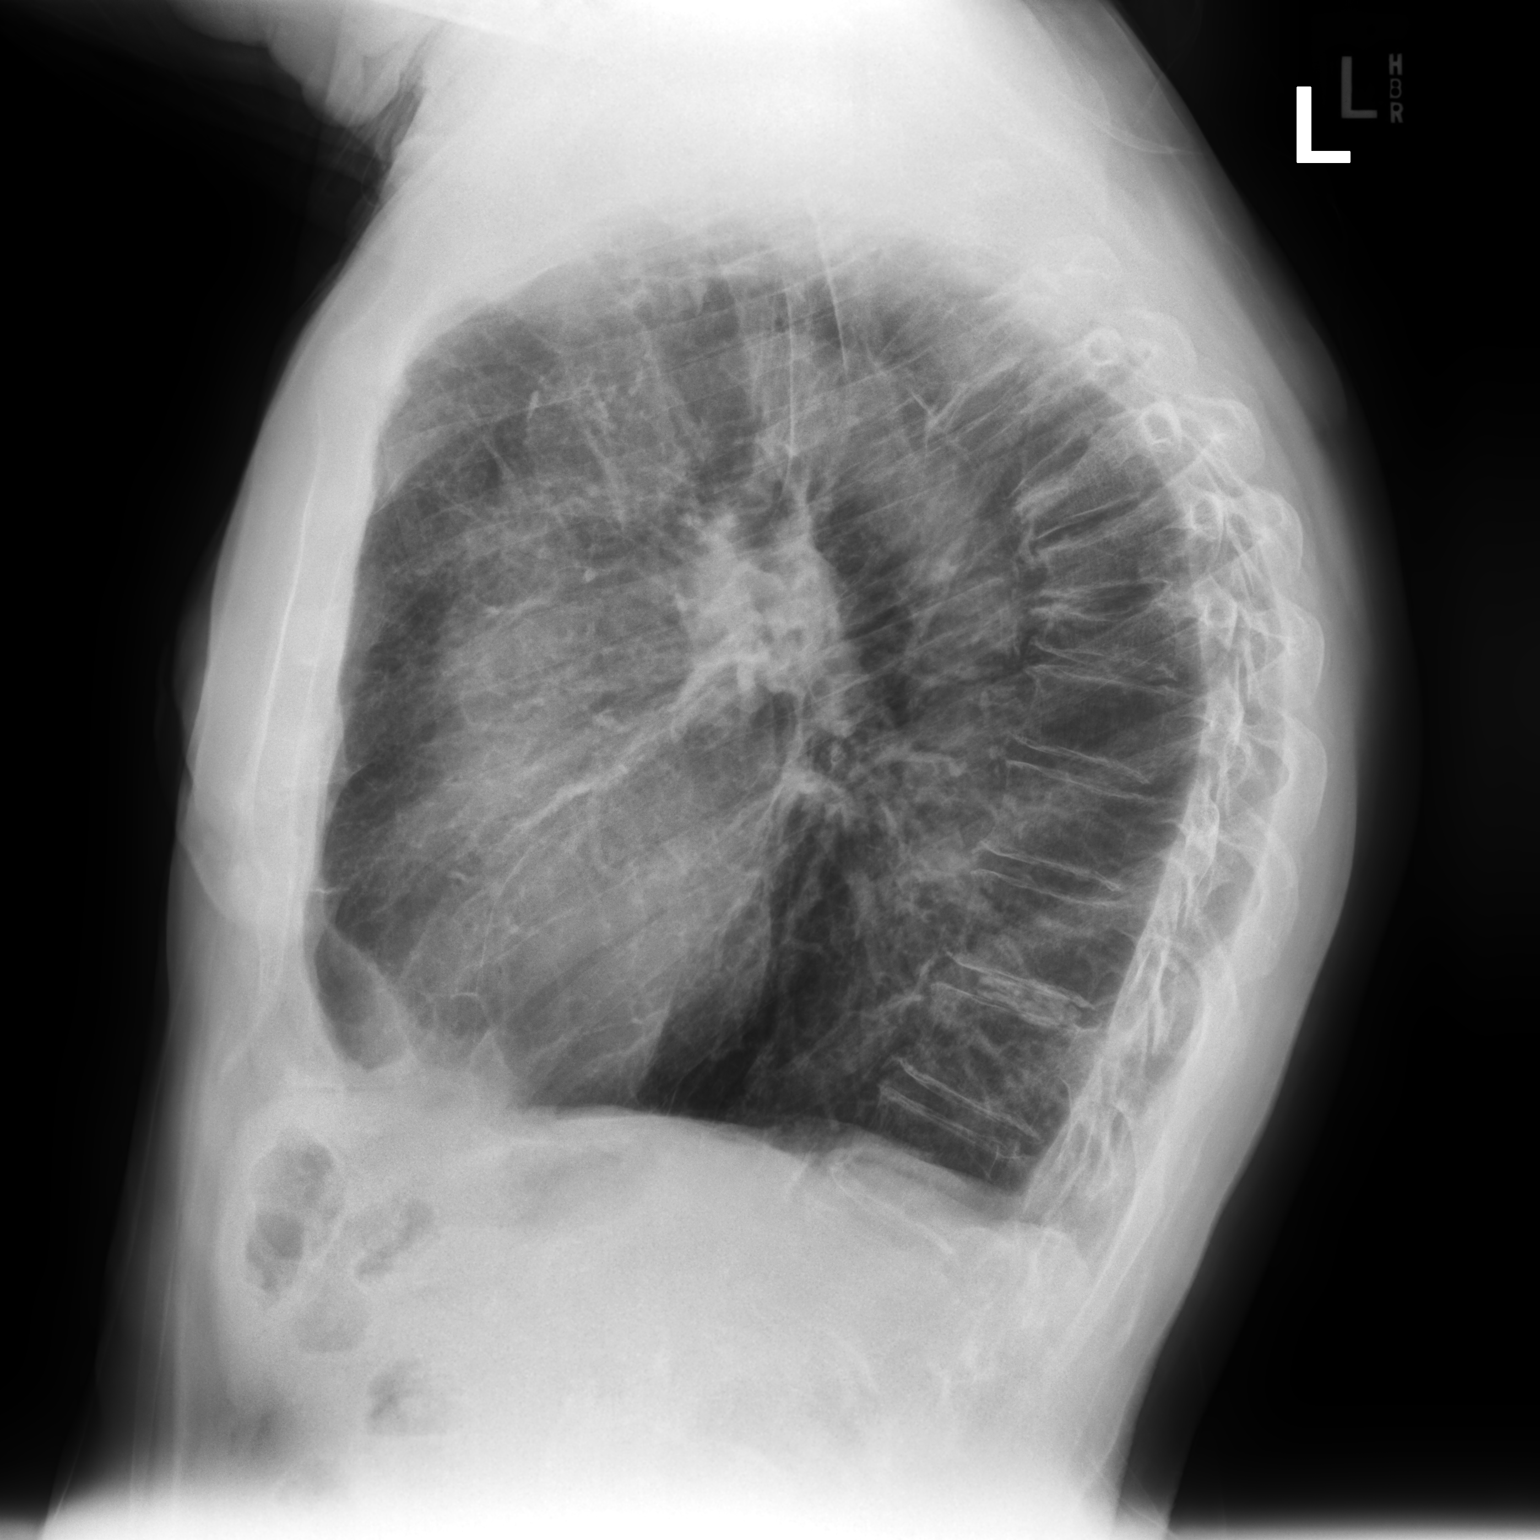

[2 of 2 positions shown; findings below may reference images not displayed]

FINDINGS: Normal heart size. Calcific aortic knob. Hyperexpanded lungs.
Chronic biapical pleuroparenchymal scarring, right greater than
left, similar to prior. Chronically coarsened interstitial markings
bilaterally without new focal airspace consolidation. No pleural
effusion or pneumothorax. Chronic moderate compression deformities
within the midthoracic spine at T6-T8 where there is increased
kyphotic curvature.
IMPRESSION: No acute cardiopulmonary findings. Chronic findings of COPD, as
described above.

## 2019-09-06 MED ORDER — AMOXICILLIN-POT CLAVULANATE 875-125 MG PO TABS: 1.0000 | ORAL_TABLET | Freq: Two times a day (BID) | ORAL | 0 refills | Status: DC

## 2019-09-06 MED ORDER — PREDNISONE 10 MG PO TABS: ORAL_TABLET | ORAL | 0 refills | Status: DC

## 2019-09-06 NOTE — Assessment & Plan Note (Signed)
Plan: Continue Xarelto 

## 2019-09-06 NOTE — Assessment & Plan Note (Signed)
Last treated for COPD exacerbation on 07/01/2019 with doxycycline and prednisone History of frequent exacerbations 2 to 3 weeks of increased cough, congestion and wheezing Reports adherence to Symbicort 160, Spiriva Respimat 2.5, Monday Wednesday Friday azithromycin, Daliresp and prednisone 10 mg daily  Plan: Sputum culture today Chest x-ray today We will treat with Augmentin May need to add second antibiotic based off of x-ray results Prednisone taper, then resume daily prednisone dose of 10 mg After antibiotic course resume Monday Wednesday Friday azithromycin 2-week follow-up with our office

## 2019-09-06 NOTE — Assessment & Plan Note (Signed)
Plan: Continue oxygen therapy 3 L, okay to increase to 3-1/2 L with physical exertion Goal oxygen saturation should be 88 to 92% Follow-up in 2 weeks, with walk We will defer walk today due to persistent COPD exacerbation symptoms

## 2019-09-06 NOTE — Patient Instructions (Addendum)
You were seen today by Lauraine Rinne, NP  for:   Is a pleasure seeing you today.  I am going to check your mucus to see if there is any bacteria growing that are difficult to treat with antibiotics.  Please bring this back to our office within 3 hours of bringing up the mucus.  We will treat you with antibiotics today Augmentin.  We will get an x-ray today.  If your x-ray is abnormal or concerning for pneumonia I will likely treat you with a second antibiotic as well as the Augmentin.  Continue your other respiratory medications as prescribed.  I will treat you with a course of prednisone today.  When you finish the taper of prednisone that I am prescribing you then you can resume daily 10 mg as previously managed.  Please read all the instructions below  And like we discussed today in office please contact us if you are having worsened symptoms.  Or send a MyChart message.  I am here to help you in more than happy to speak with you at any time that you need.  [name]  1. Stage 4 very severe COPD by GOLD classification ([place])  - DG Chest 2 View; Future - amoxicillin-clavulanate (AUGMENTIN) 875-125 MG tablet; Take 1 tablet by mouth 2 (two) times daily.  Dispense: 20 tablet; Refill: 0 - predniSONE (DELTASONE) 10 MG tablet; Take 4 tablets (40 mg total) by mouth daily with breakfast for 3 days, THEN 3 tablets (30 mg total) daily with breakfast for 3 days, THEN 2 tablets (20 mg total) daily with breakfast for 3 days, THEN 1 tablet (10 mg total) daily with breakfast for 3 days.  Dispense: 30 tablet; Refill: 0  Continue Symbicort 160 >>> 2 puffs in the morning right when you wake up, rinse out your mouth after use, 12 hours later 2 puffs, rinse after use >>> Take this daily, no matter what >>> This is not a rescue inhaler   Spiriva Respimat 2.5 >>> 2 puffs daily >>> Do this every day >>>This is not a rescue inhaler    Note your daily symptoms > remember "red flags" for COPD:   >>>Increase in  cough >>>increase in sputum production >>>increase in shortness of breath or activity  intolerance.   If you notice these symptoms, please call the office to be seen.    Let me know your thoughts on if you would like a palliative care referral I believe this would be a good next step  Please let me know your thoughts on if you would like to consider physical therapy referral or a referral to pulmonary rehab   2. Chronic respiratory failure with hypoxia (HCC)  Continue oxygen therapy as prescribed  >>>maintain oxygen saturations greater than 88 percent  >>>if unable to maintain oxygen saturations please contact the office  >>>do not smoke with oxygen  >>>can use nasal saline gel or nasal saline rinses to moisturize nose if oxygen causes dryness   Goal oxygen saturation should be between 88 to 92% on oxygen  3. Other chronic pulmonary embolism, unspecified whether acute cor pulmonale present ([place])  Continue Xarelto  We recommend today:  Orders Placed This Encounter  Procedures  . Respiratory or Resp and Sputum Culture    Standing Status:   Future    Standing Expiration Date:   09/05/2020  . DG Chest 2 View    Standing Status:   Future    Standing Expiration Date:   11/05/2020  Order Specific Question:   Reason for Exam (SYMPTOM  OR DIAGNOSIS REQUIRED)    Answer:   copd, wheezing    Order Specific Question:   Preferred imaging location?    Answer:   Internal    Order Specific Question:   Radiology Contrast Protocol - do NOT remove file path    Answer:   [email or web address]   Orders Placed This Encounter  Procedures  . Respiratory or Resp and Sputum Culture  . DG Chest 2 View   Meds ordered this encounter  Medications  . amoxicillin-clavulanate (AUGMENTIN) 875-125 MG tablet    Sig: Take 1 tablet by mouth 2 (two) times daily.    Dispense:  20 tablet    Refill:  0  . predniSONE (DELTASONE) 10 MG tablet    Sig: Take 4 tablets (40 mg  total) by mouth daily with breakfast for 3 days, THEN 3 tablets (30 mg total) daily with breakfast for 3 days, THEN 2 tablets (20 mg total) daily with breakfast for 3 days, THEN 1 tablet (10 mg total) daily with breakfast for 3 days.    Dispense:  30 tablet    Refill:  0    Follow Up:    Return in about 2 weeks (around 09/20/2019), or if symptoms worsen or fail to improve, for Follow up with [name] FNP-C, Follow up with Dr. Purnell Shoemaker.   Please do your part to reduce the spread of COVID-19:      Reduce your risk of any infection  and COVID19 by using the similar precautions used for avoiding the common cold or flu:  [name] Kitchen Wash your hands often with soap and warm water for at least 20 seconds.  If soap and water are not readily available, use an alcohol-based hand sanitizer with at least 60% alcohol.  . If coughing or sneezing, cover your mouth and nose by coughing or sneezing into the elbow areas of your shirt or coat, into a tissue or into your sleeve (not your hands). Langley Gauss A MASK when in public  . Avoid shaking hands with others and consider head nods or verbal greetings only. . Avoid touching your eyes, nose, or mouth with unwashed hands.  . Avoid close contact with people who are sick. . Avoid places or events with large numbers of people in one location, like concerts or sporting events. . If you have some symptoms but not all symptoms, continue to monitor at home and seek medical attention if your symptoms worsen. . If you are having a medical emergency, call 911.   [place] / e-Visit: [email or web address]         [facility]: [facility]: [phone]                   [facility]: [phone]     It is flu season:   >>> Best ways to protect herself from the flu: Receive the yearly flu vaccine, practice good hand  hygiene washing with soap and also using hand sanitizer when available, eat a nutritious meals, get adequate rest, hydrate appropriately   Please contact the office if your symptoms worsen or you have concerns that you are not improving.   Thank you for choosing [facility] for your healthcare, and for allowing [place] to partner with you on your healthcare journey. I am thankful to be able to provide care to you today.   [name]  [facility] FNP-C

## 2019-09-06 NOTE — Assessment & Plan Note (Signed)
Plan: Prednisone taper today as outlined, then resume 10 mg of prednisone daily Augmentin today, then resume Monday Wednesday Friday azithromycin Continue Symbicort 160 Continue Daliresp Continue Spiriva Respimat 2.5 2-week follow-up Offered palliative care referral today, patient and spouse will think about it Offered physical therapy or pulmonary rehab referral today, patient spouse will think about it

## 2019-09-07 ENCOUNTER — Telehealth: Payer: Self-pay | Admitting: Hospice

## 2019-09-07 NOTE — Telephone Encounter (Signed)
[name], please see mychart message from pt. Thanks!

## 2019-09-07 NOTE — Telephone Encounter (Signed)
Order has been placed. Nothing further needed. 

## 2019-09-07 NOTE — Telephone Encounter (Signed)
Please go ahead and place order for palliative care referral. Associate to COPD.   I have called Margaretmary Eddy with pallative care to notify that the orders have been placed.    [name] FNP

## 2019-09-07 NOTE — Telephone Encounter (Signed)
Scheduled [facility]iative visit for 10-06-19 at 11:30.

## 2019-09-15 ENCOUNTER — Ambulatory Visit: Payer: Medicare Other | Admitting: Vascular Surgery

## 2019-09-16 ENCOUNTER — Encounter: Payer: Self-pay | Admitting: Vascular Surgery

## 2019-09-16 ENCOUNTER — Other Ambulatory Visit: Payer: Self-pay

## 2019-09-16 ENCOUNTER — Ambulatory Visit (INDEPENDENT_AMBULATORY_CARE_PROVIDER_SITE_OTHER): Payer: Medicare Other | Admitting: Vascular Surgery

## 2019-09-16 VITALS — BP 132/78 | HR 100 | Temp 97.3°F | Resp 20 | Ht 72.0 in | Wt 165.0 lb

## 2019-09-16 DIAGNOSIS — I739 Peripheral vascular disease, unspecified: Secondary | ICD-10-CM | POA: Diagnosis not present

## 2019-09-16 NOTE — Progress Notes (Signed)
Patient is a 74 year old male who returns for follow-up today.  He previous underwent right common iliac stenting June 2020.  This was done for a nonhealing wound in the right foot.  He subsequently underwent right common femoral endarterectomy on April 19, 2019.  Postoperative ABIs were 0.91 on the right 0.84 on the left.  His last noninvasive study was November 2020.  He is on Xarelto.  This was from a prior pulmonary embolus.  He is also on 3 L of continuous home oxygen.  He still has problems with swelling in both lower extremities.  Unfortunately he is unable to wear compression stockings or Ace wraps due to very fragile skin.  He tries to elevate his legs as much as possible.  Past Medical History:  Diagnosis Date  . Chest x-ray abnormality   . Chronic back pain    "mid and lower" (04/07/2018)  . Chronic rhinitis    -Sinus Ct 08/01/2009 >> Bilateral maxillary sinusitis with some mucosal thickeningin the sphenoid and frontal sinuses as well with air fluid levels present -chronic rhinitis flyer Aug 04, 2009  . Compressed spine fracture ([place])   . COPD (chronic obstructive pulmonary disease) [facility])    PFT's rec Jul 17, 2009  . Dyspnea   . Emphysema lung ([place])   . Emphysema of lung ([place])   . On home oxygen therapy    "3L; 24/7" (04/07/2018)  . Onychomycosis    Dr. Judi Cong  . Orthostatic hypotension    "since 10/2017" (04/07/2018)  . PAD (peripheral artery disease) ([place])   . Pneumonia    "twice in 1 year" (04/07/2018)  . Pulmonary embolism ([place]) 04/07/2018  . Skin cancer    "lips, face, ears, arms" (04/07/2018)  . Vertigo    "since ~ 02/2018" (04/07/2018)    Past Surgical History:  Procedure Laterality Date  . APPENDECTOMY    . ENDARTERECTOMY FEMORAL Right 04/19/2019   Procedure: ENDARTERECTOMY RIGHT COMMON FEMORAL;  Surgeon: Elam Dutch, MD;  Location: [place];  Service: Vascular;  Laterality: Right;  . LOWER EXTREMITY ANGIOGRAPHY  12/11/2018  . LOWER EXTREMITY ANGIOGRAPHY N/A  12/11/2018   Procedure: LOWER EXTREMITY ANGIOGRAPHY;  Surgeon: Elam Dutch, MD;  Location: [place] [facility];  Service: Cardiovascular;  Laterality: N/A;  . PATCH ANGIOPLASTY Right 04/19/2019   Procedure: Patch Angioplasty;  Surgeon: Elam Dutch, MD;  Location: [facility];  Service: Vascular;  Laterality: Right;  . PERIPHERAL VASCULAR INTERVENTION Right 12/11/2018   Procedure: PERIPHERAL VASCULAR INTERVENTION;  Surgeon: Elam Dutch, MD;  Location: [facility];  Service: Cardiovascular;  Laterality: Right;  Common Iliac   . SKIN CANCER EXCISION     "lips, face, ears, arms" (04/07/2018)  . TONSILLECTOMY      Current Outpatient Medications on File Prior to Visit  Medication Sig Dispense Refill  . acetaminophen (TYLENOL) 500 MG tablet Take 1,000 mg by mouth 2 (two) times daily.     [name] Kitchen albuterol (VENTOLIN HFA) 108 (90 Base) MCG/ACT inhaler USE 2 PUFFS EVERY 6 HOURS  AS NEEDED FOR WHEEZING 18 g 3  . amoxicillin-clavulanate (AUGMENTIN) 875-125 MG tablet Take 1 tablet by mouth 2 (two) times daily. 20 tablet 0  . azithromycin (ZITHROMAX) 250 MG tablet Take 1 tablet (250 mg total) by mouth every Monday, Wednesday, and Friday. 45 tablet 1  . budesonide-formoterol (SYMBICORT) 160-4.5 MCG/ACT inhaler Inhale 2 puffs into the lungs 2 (two) times daily. 30 g 3  . Calcium Carbonate-Vitamin D (CALCIUM 600+D PO) Take 2  tablets by mouth daily.    . cycloSPORINE (RESTASIS) 0.05 % ophthalmic emulsion 1 drop 2 (two) times daily.    . fluticasone (FLONASE) 50 MCG/ACT nasal spray Place 2 sprays into both nostrils daily. 16 g 3  . furosemide (LASIX) 20 MG tablet TAKE 1 TABLET BY MOUTH  DAILY 90 tablet 3  . Misc. Devices (PULSE OXIMETER FOR FINGER) MISC 1 Device by Does not apply route as needed. 1 each 0  . OXYGEN Inhale 3 L into the lungs continuous.     . predniSONE (DELTASONE) 10 MG tablet Take 1 tablet (10 mg total) by mouth daily with breakfast. 90 tablet 3  . predniSONE (DELTASONE) 10 MG tablet  Take 4 tablets (40 mg total) by mouth daily with breakfast for 3 days, THEN 3 tablets (30 mg total) daily with breakfast for 3 days, THEN 2 tablets (20 mg total) daily with breakfast for 3 days, THEN 1 tablet (10 mg total) daily with breakfast for 3 days. 30 tablet 0  . rivaroxaban (XARELTO) 20 MG TABS tablet TAKE 1 TABLET BY MOUTH  DAILY 90 tablet 3  . roflumilast (DALIRESP) 500 MCG TABS tablet Take 1 tablet (500 mcg total) by mouth daily. 90 tablet 2  . SPIRIVA RESPIMAT 2.5 MCG/ACT AERS USE 2 INHALATIONS BY MOUTH  ONCE DAILY (Patient taking differently: Inhale 2 puffs into the lungs daily. ) 12 g 3  . Tetrahydrozoline HCl (REDNESS RELIEVER EYE DROPS OP) Place 1 drop into both eyes daily as needed (redness / itching).    . traMADol (ULTRAM) 50 MG tablet Take 1 tablet (50 mg total) by mouth every 12 (twelve) hours as needed for moderate pain or severe pain. 30 tablet 1   No current facility-administered medications on file prior to visit.    Physical exam:  Vitals:   09/16/19 1413  BP: 132/78  Pulse: 100  Resp: 20  Temp: (!) 97.3 F (36.3 C)  SpO2: 99%  Weight: 165 lb (74.8 kg)  Height: 6' (1.829 m)    Extremities: No palpable pedal pulses edema extending from the knee down to the foot bilaterally.  Some areas of cracked skin with serous drainage.  Assessment: Adequate perfusion for wound healing both lower extremities.  Patient's primary problem at this point is leg swelling most likely due to right heart failure and pulmonary hypertension.  I discussed with him wearing compression stockings available.  Certainly elevating his legs as much as possible.  Plan: The patient will follow up in 6 months time with repeat ABIs.  He will follow-up sooner if he has significant ulceration or wound breakdown that occurs.  Unfortunately if he has any wound breakdown most likely it is going to involve compression therapy to get it to heal  Ruta Hinds, MD [facility]  [place] [place]: [phone]

## 2019-09-16 NOTE — Progress Notes (Signed)
@Patient  ID: Francisco [name], male    DOB: [birth year], 74 y.o.   MRN: [id number]  Chief Complaint  Patient presents with  . Follow-up    2 wk f/u for COPD. States he is feeling slightly better.     Referring provider: [name], MD  HPI:  74 year old male former smoker followed in our office for COPD  PMH: Hypertension, osteoporosis, chronic PE Smoker/ Smoking History: Former smoker.  Quit smoking in 2019.  104-pack-year smoking history. Maintenance: Symbicort 160, Spiriva Respimat 2.5, 10 mg prednisone daily, daliresp, Azithro MWF  Pt of: [name]  09/17/2019  - Visit   74 year old male former smoker followed in our office for stage IV COPD.  Patient has history of frequent COPD exacerbations.  Presenting today as a 2-week follow-up after being treated with prednisone as well as Augmentin.  Patient reports that he feels that his breathing has improved not yet fully back to baseline.  Recently finished prednisone and is back on his daily 10 mg dose.  Patient restarted his Monday Wednesday Friday azithromycin today.  Spouse is present with him today reporting that she still has concerns regarding his oxygen levels.  She reports that when he was ambulating earlier this week at home he is on 3 L continuous and oxygen saturations dropped to 84%.  We will assess this today.  Patient reports adherence to his inhalers.  He does have to use his rescue, 2-3 times a day this is his baseline.  Questionaires / Pulmonary Flowsheets:   MMRC: mMRC Dyspnea Scale mMRC Score  09/17/2019 2  09/06/2019 2    Tests:   04/15/2019 - EKG - QTC - 424  04/07/2018-CTA- nonocclusive PE centrally involving branches of the right middle lobe and right lower lobe, no evidence of right heart strain  11/04/2017-echocardiogram-LV ejection fraction of 65 to 17%, grade 1 diastolic dysfunction  11/15/2583-IDPOEUMPN function test- FVC 4.59 (92% predicted), postbronchodilator ratio 29, postbronchodilator  FEV1 1.41 (40% predicted), DLCO 72  11/27/2018-chest x-ray- no active cardiopulmonary disease, COPD  04/15/2019-chest x-ray-pulmonary hyperinflation emphysema without acute abnormality of the lungs  07/07/2019-chest x-ray-no radiographic evidence of acute cardiopulmonary disease, chronic peribronchial cuffing and emphysematous changes compatible with COPD, extensive scarring in the apices of the lungs bilaterally, right greater than left, aortic arthrosclerosis   FENO:  No results found for: NITRICOXIDE  PFT: No flowsheet data found.  WALK:  No flowsheet data found.  Imaging: DG Chest 2 View  Result Date: 09/06/2019 CLINICAL DATA:  Wheezing, COPD EXAM: CHEST - 2 VIEW COMPARISON:  07/07/2019, 04/07/2018 FINDINGS: Normal heart size. Calcific aortic knob. Hyperexpanded lungs. Chronic biapical pleuroparenchymal scarring, right greater than left, similar to prior. Chronically coarsened interstitial markings bilaterally without new focal airspace consolidation. No pleural effusion or pneumothorax. Chronic moderate compression deformities within the midthoracic spine at T6-T8 where there is increased kyphotic curvature. IMPRESSION: No acute cardiopulmonary findings. Chronic findings of COPD, as described above. Electronically Signed   By: Davina Poke D.O.   On: 09/06/2019 11:07    Lab Results:  CBC    Component Value Date/Time   WBC 10.1 06/02/2019 1105   RBC 3.90 (L) 06/02/2019 1105   HGB 12.2 (L) 06/02/2019 1105   HCT 36.8 (L) 06/02/2019 1105   PLT 311.0 06/02/2019 1105   MCV 94.2 06/02/2019 1105   MCH 33.0 04/20/2019 0229   MCHC 33.1 06/02/2019 1105   RDW 12.8 06/02/2019 1105   LYMPHSABS 0.5 (L) 06/02/2019 1105   MONOABS 0.5 06/02/2019 1105  EOSABS 0.0 06/02/2019 1105   BASOSABS 0.0 06/02/2019 1105    BMET    Component Value Date/Time   NA 138 06/02/2019 1105   K 4.8 06/02/2019 1105   CL 97 06/02/2019 1105   CO2 33 (H) 06/02/2019 1105   GLUCOSE 111 (H) 06/02/2019  1105   BUN 10 06/02/2019 1105   CREATININE 0.91 06/02/2019 1105   CALCIUM 9.9 06/02/2019 1105   GFRNONAA >60 04/20/2019 0229   GFRAA >60 04/20/2019 0229    BNP    Component Value Date/Time   BNP 83.9 11/02/2017 1957    ProBNP    Component Value Date/Time   PROBNP 50.0 04/03/2018 1148    Specialty Problems      Pulmonary Problems   COPD (chronic obstructive pulmonary disease) with emphysema (HCC)     PFT's 09/2009:  FEV1 1.20 (34%), ratio 26, increase 17% with BD, +++airtrapping, DLCO 72% Suspect due to emphysema and asthma AAT level 2012:  Normal level, M? (unidentified allelle on testing).   Referred to pulmonary rehab 08/2011. Sinusitis often flares underlying lung disease.  ++response to singulair trial Unable to tolerate daliresp Trial of azithro MWF 05/2014 >> pt thinks this has helped him Continued pipe smoking >> significant inhalation per patient. Nuclear stress test 2013 normal, echo ok.  Ambulatory ox 06/2014:  No desat below 90%.  Low dose chronic prednisone started 05/2014       Pulmonary nodule    Ct chest 2013:  Multiple bilat pulmonary nodules. CT chest 02/2012:  Significant decrease in size of RUL nodule, LLL nodule unchanged.  Needs f/u 02/2013.  CT chest 05/2013:  No nodules seen.       Chronic rhinitis    F/u [facility]/ ENT - Sinus  CT 08/01/09 Bilateral maxillary sinusitis with some mucosal thickening in the  sphenoid and frontal sinuses as well.       Chronic respiratory failure with hypoxia (HCC)   COPD with acute exacerbation (HCC)   Stage 4 very severe COPD by GOLD classification (HCC)      Allergies  Allergen Reactions  . Tape Other (See Comments)    SKIN IS VERY THIN; CAN ONLY USE COBAN WRAPS DUE TO CONDITION OF SKIN!!  . Levaquin [Levofloxacin]     hallucinations    Immunization History  Administered Date(s) Administered  . Fluad Quad(high Dose 65+) 03/16/2019  . Influenza Split 04/24/2012  . Influenza Whole 05/08/2009,  04/08/2011  . Influenza, High Dose Seasonal PF 05/01/2017, 03/31/2018  . Influenza,inj,Quad PF,6+ Mos 04/27/2013, 04/15/2014, 04/24/2015  . Influenza,inj,quad, With Preservative 05/01/2018  . Influenza-Unspecified 05/13/2016  . Pneumococcal Conjugate-13 02/25/2014  . Pneumococcal Polysaccharide-23 08/15/2011, 12/19/2016  . Pneumococcal-Unspecified 03/08/2017  . Td 02/21/2010  . Tdap 11/05/2017  . Zoster 01/29/2012    Past Medical History:  Diagnosis Date  . Chest x-ray abnormality   . Chronic back pain    "mid and lower" (04/07/2018)  . Chronic rhinitis    -Sinus Ct 08/01/2009 >> Bilateral maxillary sinusitis with some mucosal thickeningin the sphenoid and frontal sinuses as well with air fluid levels present -chronic rhinitis flyer Aug 04, 2009  . Compressed spine fracture ([place])   . COPD (chronic obstructive pulmonary disease) [facility])    PFT's rec Jul 17, 2009  . Dyspnea   . Emphysema lung ([place])   . Emphysema of lung ([place])   . On home oxygen therapy    "3L; 24/7" (04/07/2018)  . Onychomycosis    Dr. Judi Cong  . Orthostatic hypotension    "  since 10/2017" (04/07/2018)  . PAD (peripheral artery disease) ([place])   . Pneumonia    "twice in 1 year" (04/07/2018)  . Pulmonary embolism ([place]) 04/07/2018  . Skin cancer    "lips, face, ears, arms" (04/07/2018)  . Vertigo    "since ~ 02/2018" (04/07/2018)    Tobacco History: Social History   Tobacco Use  Smoking Status Former Smoker  . Packs/day: 2.00  . Years: 52.00  . Pack years: 104.00  . Types: Pipe, Cigarettes  . Start date: 10/06/1965  . Quit date: 10/06/2017  . Years since quitting: 1.9  Smokeless Tobacco Never Used  Tobacco Comment   04/07/2018 "smoked cigarettes years ago; stopped ~ 20 yr ago; stopped smoking pipe in 09/2017"   Counseling given: Yes Comment: 04/07/2018 "smoked cigarettes years ago; stopped ~ 20 yr ago; stopped smoking pipe in 09/2017"  Continue to not smoke  Outpatient Encounter Medications as of 09/17/2019    Medication Sig  . acetaminophen (TYLENOL) 500 MG tablet Take 1,000 mg by mouth 2 (two) times daily.   [name] Kitchen albuterol (VENTOLIN HFA) 108 (90 Base) MCG/ACT inhaler USE 2 PUFFS EVERY 6 HOURS  AS NEEDED FOR WHEEZING  . azithromycin (ZITHROMAX) 250 MG tablet Take 1 tablet (250 mg total) by mouth every Monday, Wednesday, and Friday.  . budesonide-formoterol (SYMBICORT) 160-4.5 MCG/ACT inhaler Inhale 2 puffs into the lungs 2 (two) times daily.  . Calcium Carbonate-Vitamin D (CALCIUM 600+D PO) Take 2 tablets by mouth daily.  . cycloSPORINE (RESTASIS) 0.05 % ophthalmic emulsion 1 drop 2 (two) times daily.  . fluticasone (FLONASE) 50 MCG/ACT nasal spray Place 2 sprays into both nostrils daily.  . furosemide (LASIX) 20 MG tablet TAKE 1 TABLET BY MOUTH  DAILY  . Misc. Devices (PULSE OXIMETER FOR FINGER) MISC 1 Device by Does not apply route as needed.  . OXYGEN Inhale 3 L into the lungs continuous.   . predniSONE (DELTASONE) 10 MG tablet Take 1 tablet (10 mg total) by mouth daily with breakfast.  . rivaroxaban (XARELTO) 20 MG TABS tablet TAKE 1 TABLET BY MOUTH  DAILY  . roflumilast (DALIRESP) 500 MCG TABS tablet Take 1 tablet (500 mcg total) by mouth daily.  [name] Kitchen SPIRIVA RESPIMAT 2.5 MCG/ACT AERS USE 2 INHALATIONS BY MOUTH  ONCE DAILY (Patient taking differently: Inhale 2 puffs into the lungs daily. )  . Tetrahydrozoline HCl (REDNESS RELIEVER EYE DROPS OP) Place 1 drop into both eyes daily as needed (redness / itching).  . traMADol (ULTRAM) 50 MG tablet Take 1 tablet (50 mg total) by mouth every 12 (twelve) hours as needed for moderate pain or severe pain.  . [DISCONTINUED] amoxicillin-clavulanate (AUGMENTIN) 875-125 MG tablet Take 1 tablet by mouth 2 (two) times daily.  . [DISCONTINUED] predniSONE (DELTASONE) 10 MG tablet Take 4 tablets (40 mg total) by mouth daily with breakfast for 3 days, THEN 3 tablets (30 mg total) daily with breakfast for 3 days, THEN 2 tablets (20 mg total) daily with breakfast for 3 days,  THEN 1 tablet (10 mg total) daily with breakfast for 3 days.   No facility-administered encounter medications on file as of 09/17/2019.     Review of Systems  Review of Systems  Constitutional: Positive for fatigue. Negative for activity change, chills, fever and unexpected weight change.  HENT: Negative for postnasal drip, rhinorrhea, sinus pressure, sinus pain and sore throat.   Eyes: Negative.   Respiratory: Positive for cough, shortness of breath and wheezing.   Cardiovascular: Negative for chest pain and palpitations.  Gastrointestinal: Negative for constipation, diarrhea, nausea and vomiting.  Endocrine: Negative.   Genitourinary: Negative.   Musculoskeletal: Negative.   Skin: Negative.   Neurological: Negative for dizziness and headaches.  Psychiatric/Behavioral: Negative.  Negative for dysphoric mood. The patient is not nervous/anxious.   All other systems reviewed and are negative.    Physical Exam  BP 118/72 (BP Location: Right Arm, Patient Position: Sitting, Cuff Size: Normal)   Pulse 98   Temp (!) 97 F (36.1 C) (Temporal)   Ht 6' (1.829 m)   SpO2 98% Comment: on 3L  BMI 22.38 kg/m   Wt Readings from Last 5 Encounters:  09/16/19 165 lb (74.8 kg)  09/06/19 165 lb (74.8 kg)  07/07/19 167 lb (75.8 kg)  06/02/19 168 lb 6.4 oz (76.4 kg)  05/13/19 166 lb (75.3 kg)    BMI Readings from Last 5 Encounters:  09/17/19 22.38 kg/m  09/16/19 22.38 kg/m  09/06/19 22.38 kg/m  07/07/19 22.65 kg/m  06/02/19 22.84 kg/m     Physical Exam Vitals and nursing note reviewed.  Constitutional:      General: He is not in acute distress.    Appearance: Normal appearance. He is normal weight.  HENT:     Head: Normocephalic and atraumatic.     Right Ear: Hearing, tympanic membrane, ear canal and external ear normal. There is no impacted cerumen.     Left Ear: Hearing, tympanic membrane, ear canal and external ear normal. There is no impacted cerumen.     Nose:  Congestion present. No mucosal edema or rhinorrhea.     Right Turbinates: Not enlarged.     Left Turbinates: Not enlarged.     Mouth/Throat:     Mouth: Mucous membranes are dry.     Pharynx: Oropharynx is clear. No oropharyngeal exudate.  Eyes:     Pupils: Pupils are equal, round, and reactive to light.  Cardiovascular:     Rate and Rhythm: Normal rate and regular rhythm.     Pulses: Normal pulses.     Heart sounds: Normal heart sounds. No murmur.  Pulmonary:     Effort: Pulmonary effort is normal.     Breath sounds: Wheezing and rhonchi present. No decreased breath sounds or rales.  Musculoskeletal:     Cervical back: Normal range of motion.     Right lower leg: Edema (4+) present.     Left lower leg: Edema (4+) present.  Lymphadenopathy:     Cervical: No cervical adenopathy.  Skin:    General: Skin is warm and dry.     Capillary Refill: Capillary refill takes less than 2 seconds.     Findings: No erythema or rash.  Neurological:     General: No focal deficit present.     Mental Status: He is alert and oriented to person, place, and time.     Motor: No weakness.     Coordination: Coordination normal.     Gait: Gait is intact. Gait normal.  Psychiatric:        Mood and Affect: Mood normal.        Behavior: Behavior normal. Behavior is cooperative.        Thought Content: Thought content normal.        Judgment: Judgment normal.       Assessment & Plan:   Stage 4 very severe COPD by GOLD classification ([place]) Recently recovered from a COPD exacerbation History of frequent exacerbations Recently treated with Augmentin and prednisone taper  Plan: Continue Symbicort 160  Continue Spiriva Respimat 2.5 Continue 10 mg daily prednisone Continue Daliresp Continue Monday Wednesday Friday azithromycin 4-week follow-up with our office Continue to establish with palliative care     Chronic respiratory failure with hypoxia ([place]) Walk in office today, patient was able to  complete 1 lap on 3 L before oxygen saturations dropped and patient required 4 L  Plan:  Continue oxygen therapy as prescribed - 4L with exertion    COPD with acute exacerbation ([place]) Slowly resolving    Return in about 4 weeks (around 10/15/2019), or if symptoms worsen or fail to improve, for Follow up with Dr. Purnell Shoemaker, Follow up with [name] FNP-C.   Lauraine Rinne, NP 09/17/2019   This appointment required 32 minutes of patient care (this includes precharting, chart review, review of results, face-to-face care, etc.).

## 2019-09-17 ENCOUNTER — Other Ambulatory Visit: Payer: Self-pay | Admitting: *Deleted

## 2019-09-17 ENCOUNTER — Ambulatory Visit (INDEPENDENT_AMBULATORY_CARE_PROVIDER_SITE_OTHER): Payer: Medicare Other | Admitting: Pulmonary Disease

## 2019-09-17 ENCOUNTER — Encounter: Payer: Self-pay | Admitting: Pulmonary Disease

## 2019-09-17 VITALS — BP 118/72 | HR 98 | Temp 97.0°F | Ht 72.0 in

## 2019-09-17 DIAGNOSIS — J9611 Chronic respiratory failure with hypoxia: Secondary | ICD-10-CM

## 2019-09-17 DIAGNOSIS — I739 Peripheral vascular disease, unspecified: Secondary | ICD-10-CM

## 2019-09-17 DIAGNOSIS — J441 Chronic obstructive pulmonary disease with (acute) exacerbation: Secondary | ICD-10-CM | POA: Diagnosis not present

## 2019-09-17 DIAGNOSIS — J449 Chronic obstructive pulmonary disease, unspecified: Secondary | ICD-10-CM

## 2019-09-17 NOTE — Assessment & Plan Note (Addendum)
Recently recovered from a COPD exacerbation History of frequent exacerbations Recently treated with Augmentin and prednisone taper  Plan: Continue Symbicort 160 Continue Spiriva Respimat 2.5 Continue 10 mg daily prednisone Continue Daliresp Continue Monday Wednesday Friday azithromycin 4-week follow-up with our office Continue to establish with palliative care

## 2019-09-17 NOTE — Assessment & Plan Note (Addendum)
Walk in office today, patient was able to complete 1 lap on 3 L before oxygen saturations dropped and patient required 4 L  Plan:  Continue oxygen therapy as prescribed - 4L with exertion

## 2019-09-17 NOTE — Patient Instructions (Addendum)
You were seen today by Lauraine Rinne, NP  for:   It was good seeing you today in office.  Thank you for following up with [place].  Happy belated birthday.  Good luck establishing with palliative care.  Please call us if you are having any worsening symptoms or concerns.  We will plan on seeing you back in about 4 weeks.  Sooner if you need it.  Take care and stay safe,  Francisco [name]  1. Stage 4 very severe COPD by GOLD classification (HCC)  Continue Symbicort 160 >>> 2 puffs in the morning right when you wake up, rinse out your mouth after use, 12 hours later 2 puffs, rinse after use >>> Take this daily, no matter what >>> This is not a rescue inhaler   Spiriva Respimat 2.5 >>> 2 puffs daily >>> Do this every day >>>This is not a rescue inhaler  Only use your albuterol as a rescue medication to be used if you can't catch your breath by resting or doing a relaxed purse lip breathing pattern.  - The less you use it, the better it will work when you need it. - Ok to use up to 2 puffs  every 4 hours if you must but call for immediate appointment if use goes up over your usual need - Don't leave home without it !!  (think of it like the spare tire for your car)   Note your daily symptoms > remember "red flags" for COPD:   >>>Increase in cough >>>increase in sputum production >>>increase in shortness of breath or activity  intolerance.   If you notice these symptoms, please call the office to be seen.   Continue daily prednisone 10 mg  Continue Monday Wednesday Friday azithromycin  Establish with Pallative care   2. COPD with acute exacerbation (Francisco [name])  Slowly recovering from this exacerbation  3. Chronic respiratory failure with hypoxia (HCC)  Walk today in office  Continue oxygen therapy as prescribed - 4L with physical exertion  >>>maintain oxygen saturations greater than 88 percent  >>>if unable to maintain oxygen saturations please contact the office  >>>do not smoke with oxygen    >>>can use nasal saline gel or nasal saline rinses to moisturize nose if oxygen causes dryness     Follow Up:    Return in about 4 weeks (around 10/15/2019), or if symptoms worsen or fail to improve, for Follow up with Dr. Purnell Shoemaker, Follow up with [name] FNP-C.   Please do your part to reduce the spread of COVID-19:      Reduce your risk of any infection  and COVID19 by using the similar precautions used for avoiding the common cold or flu:  [name] Kitchen Wash your hands often with soap and warm water for at least 20 seconds.  If soap and water are not readily available, use an alcohol-based hand sanitizer with at least 60% alcohol.  . If coughing or sneezing, cover your mouth and nose by coughing or sneezing into the elbow areas of your shirt or coat, into a tissue or into your sleeve (not your hands). Francisco [name] A MASK when in public  . Avoid shaking hands with others and consider head nods or verbal greetings only. . Avoid touching your eyes, nose, or mouth with unwashed hands.  . Avoid close contact with people who are sick. . Avoid places or events with large numbers of people in one location, like concerts or sporting events. . If you have some symptoms but not  all symptoms, continue to monitor at home and seek medical attention if your symptoms worsen. . If you are having a medical emergency, call 911.   [place] / e-Visit: [email or web address]         [facility]: [facility]: [phone]                   [facility]: [phone]     It is flu season:   >>> Best ways to protect herself from the flu: Receive the yearly flu vaccine, practice good hand hygiene washing with soap and also using hand sanitizer when available, eat a nutritious meals, get adequate rest, hydrate appropriately   Please contact the office if your  symptoms worsen or you have concerns that you are not improving.   Thank you for choosing [facility] for your healthcare, and for allowing [place] to partner with you on your healthcare journey. I am thankful to be able to provide care to you today.   [name] FNP-C

## 2019-09-17 NOTE — Assessment & Plan Note (Signed)
Slowly resolving

## 2019-09-20 ENCOUNTER — Telehealth: Payer: Self-pay | Admitting: Pulmonary Disease

## 2019-09-20 ENCOUNTER — Other Ambulatory Visit: Payer: Self-pay | Admitting: Internal Medicine

## 2019-09-20 ENCOUNTER — Ambulatory Visit: Payer: Medicare Other | Admitting: Pulmonary Disease

## 2019-09-20 DIAGNOSIS — J441 Chronic obstructive pulmonary disease with (acute) exacerbation: Secondary | ICD-10-CM

## 2019-09-20 NOTE — Telephone Encounter (Signed)
Yes refill his maintenance prednisone as outlined on last AVS.   Thank you   [name] FNP

## 2019-09-21 MED ORDER — PREDNISONE 10 MG PO TABS: 10.0000 mg | ORAL_TABLET | Freq: Every day | ORAL | 0 refills | Status: DC

## 2019-09-21 NOTE — Telephone Encounter (Signed)
Rx refilled. Nothing further is needed.

## 2019-10-03 DIAGNOSIS — J441 Chronic obstructive pulmonary disease with (acute) exacerbation: Secondary | ICD-10-CM

## 2019-10-04 MED ORDER — CEFDINIR 300 MG PO CAPS: 300.0000 mg | ORAL_CAPSULE | Freq: Two times a day (BID) | ORAL | 0 refills | Status: DC

## 2019-10-04 MED ORDER — PREDNISONE 10 MG PO TABS: ORAL_TABLET | ORAL | 0 refills | Status: DC

## 2019-10-04 NOTE — Telephone Encounter (Signed)
10/04/2019  Please let the patient know that restarted that he is having the symptoms.  I have sent an antibiotic as well as prednisone taper to the pharmacy that he is requested.  Please make sure that he is scheduled in 1 to 2-week follow-up with our office to ensure the symptoms are improving.  I did use an antibiotic that we do not traditionally use given the fact that he is recently had doxycycline and Augmentin.  His antibiotic called Omnicef.  If he has any issues using it please contact our office.   He do not have any allergies to this.  If symptoms or not improving despite these recommendations we need to know or you need to present to an emergency room for further evaluation.  [name], FNP

## 2019-10-04 NOTE — Telephone Encounter (Signed)
Called and spoke with patient to let him know that medications have been sent in to his pharmacy. Patient already has a Televisit scheduled with [name] on 4/9 at 9:30am so I left that scheduled to follow up about symptoms.   Patient expressed understanding. Nothing further needed at this time.

## 2019-10-04 NOTE — Telephone Encounter (Signed)
[name] please advise on patients mychart message:  Having another COPD flare up.  Very congestive, shortness of breath in movement.  Please call in prednisone and antibiotic at [facility] [place].  If you need to speak with me please call [phone].

## 2019-10-06 ENCOUNTER — Other Ambulatory Visit: Payer: Self-pay

## 2019-10-06 ENCOUNTER — Other Ambulatory Visit: Payer: Medicare Other | Admitting: Hospice

## 2019-10-06 DIAGNOSIS — J449 Chronic obstructive pulmonary disease, unspecified: Secondary | ICD-10-CM

## 2019-10-06 DIAGNOSIS — Z515 Encounter for palliative care: Secondary | ICD-10-CM

## 2019-10-06 NOTE — Progress Notes (Signed)
[facility] Consult Note Telephone: [phone]  Fax: [phone]  PATIENT NAME: Francisco [name] DOB: 05-May-1946 MRN: [id number]  PRIMARY CARE PROVIDER:   [name], MD  REFERRING PROVIDER:Dr. Maypearl:   [address] [place] [address] ContactKaty Apo ( spouse) [phone] c  TELEHEALTH VISIT STATEMENT Due to the COVID-19 crisis, this visit was done via telephone from my office. It was initiated and consented to by this patient and/or family.  RECOMMENDATIONS/PLAN:   Advance Care Planning/Goals of Care: Telehealth Visit at the request of Dr. Brand Males for palliative consult. Visit consisted of building trust and discussions on Palliative Medicine as specialized medical care for people living with serious illness, aimed at facilitating better quality of life through symptoms relief, assisting with advance care plan and establishing goals of care. Patient affirmed he is a DNR. Signed DNR form is uploaded in [place]. Goals of care include to maximize quality of life and symptom management. Patient requests in person visit 10/18/2019 to further discuss.  Symptom management:  Patient reports ongoing COPD flare; currently on Cefdinir and Prednisone dose pack, continuous 02 3L/Mi: helpful; no difficulty breathing/speaking during visit. Discussed proper breathing, resting in between activities and taking breathing treatments and antibiotics as ordered. When not in exacerbation,  COPD managed with Spiriva, Symbicort, Albuterol, Azythromycin 3 x a week, daily 10mg  Prednisone, lasix. Patient is ambulatory, SOB when walking and on moderate exertion; he is compliant with his medications. Patient on Xarelto to prevent pulmonary embolism. Swelling in bilateral legs is ongoing r/t PAD with hx of Right common femoral endarterectomy bovine patch angioplasty. Compression socks and  coban wraps tried in the past - not effective. Dr Oneida Alar is his Vein doctor; follow up with him 10/15/2019.  Follow up: Palliative care will continue to follow patient for goals of care clarification and symptom management. I spent one hour and 16 minutes providing this consultation; time includes chart review and documentation. More than 50% of the time in this consultation was spent on coordinating communication  HISTORY OF PRESENT ILLNESS:  Francisco [name] is a 74 y.o. year old male with multiple medical problems including stage 4 COPD, PAD chronic pulmonary embolism. Palliative Care was asked to help address goals of care.   CODE STATUS: DNR  PPS: 50% HOSPICE ELIGIBILITY/DIAGNOSIS: TBD  PAST MEDICAL HISTORY:  Past Medical History:  Diagnosis Date  . Chest x-ray abnormality   . Chronic back pain    "mid and lower" (04/07/2018)  . Chronic rhinitis    -Sinus Ct 08/01/2009 >> Bilateral maxillary sinusitis with some mucosal thickeningin the sphenoid and frontal sinuses as well with air fluid levels present -chronic rhinitis flyer Aug 04, 2009  . Compressed spine fracture ([place])   . COPD (chronic obstructive pulmonary disease) [facility])    PFT's rec Jul 17, 2009  . Dyspnea   . Emphysema lung ([place])   . Emphysema of lung ([place])   . On home oxygen therapy    "3L; 24/7" (04/07/2018)  . Onychomycosis    Dr. Judi Cong  . Orthostatic hypotension    "since 10/2017" (04/07/2018)  . PAD (peripheral artery disease) ([place])   . Pneumonia    "twice in 1 year" (04/07/2018)  . Pulmonary embolism ([place]) 04/07/2018  . Skin cancer    "lips, face, ears, arms" (04/07/2018)  . Vertigo    "since ~ 02/2018" (04/07/2018)    SOCIAL  HX:  Social History   Tobacco Use  . Smoking status: Former Smoker    Packs/day: 2.00    Years: 52.00    Pack years: 104.00    Types: Pipe, Cigarettes    Start date: 10/06/1965    Quit date: 10/06/2017    Years since quitting: 2.0  . Smokeless tobacco: Never Used  . Tobacco comment:  04/07/2018 "smoked cigarettes years ago; stopped ~ 20 yr ago; stopped smoking pipe in 09/2017"  Substance Use Topics  . Alcohol use: Yes    Alcohol/week: 28.0 standard drinks    Types: 28 Cans of beer per week    Comment: "04/15/2019 "4 beers/day; probably closer to 28/wk"    ALLERGIES:  Allergies  Allergen Reactions  . Tape Other (See Comments)    SKIN IS VERY THIN; CAN ONLY USE COBAN WRAPS DUE TO CONDITION OF SKIN!!  . Levaquin [Levofloxacin]     hallucinations     PERTINENT MEDICATIONS:  Outpatient Encounter Medications as of 10/06/2019  Medication Sig  . acetaminophen (TYLENOL) 500 MG tablet Take 1,000 mg by mouth 2 (two) times daily.   [name] Kitchen albuterol (VENTOLIN HFA) 108 (90 Base) MCG/ACT inhaler USE 2 PUFFS EVERY 6 HOURS  AS NEEDED FOR WHEEZING  . azithromycin (ZITHROMAX) 250 MG tablet Take 1 tablet (250 mg total) by mouth every Monday, Wednesday, and Friday.  . budesonide-formoterol (SYMBICORT) 160-4.5 MCG/ACT inhaler Inhale 2 puffs into the lungs 2 (two) times daily.  . Calcium Carbonate-Vitamin D (CALCIUM 600+D PO) Take 2 tablets by mouth daily.  . cefdinir (OMNICEF) 300 MG capsule Take 1 capsule (300 mg total) by mouth 2 (two) times daily.  . cycloSPORINE (RESTASIS) 0.05 % ophthalmic emulsion 1 drop 2 (two) times daily.  . fluticasone (FLONASE) 50 MCG/ACT nasal spray Place 2 sprays into both nostrils daily.  . furosemide (LASIX) 20 MG tablet TAKE 1 TABLET BY MOUTH  DAILY  . Misc. Devices (PULSE OXIMETER FOR FINGER) MISC 1 Device by Does not apply route as needed.  . OXYGEN Inhale 3 L into the lungs continuous.   . predniSONE (DELTASONE) 10 MG tablet Take 1 tablet (10 mg total) by mouth daily with breakfast.  . predniSONE (DELTASONE) 10 MG tablet 4 tabs for 2 days, then 3 tabs for 2 days, 2 tabs for 2 days, then 1 tab for 2 days, then stop  . rivaroxaban (XARELTO) 20 MG TABS tablet TAKE 1 TABLET BY MOUTH  DAILY  . roflumilast (DALIRESP) 500 MCG TABS tablet Take 1 tablet (500 mcg  total) by mouth daily.  [name] Kitchen SPIRIVA RESPIMAT 2.5 MCG/ACT AERS USE 2 INHALATIONS BY MOUTH  ONCE DAILY (Patient taking differently: Inhale 2 puffs into the lungs daily. )  . Tetrahydrozoline HCl (REDNESS RELIEVER EYE DROPS OP) Place 1 drop into both eyes daily as needed (redness / itching).  . traMADol (ULTRAM) 50 MG tablet Take 1 tablet (50 mg total) by mouth every 12 (twelve) hours as needed for moderate pain or severe pain.   No facility-administered encounter medications on file as of 10/06/2019.   Teodoro Spray, NP

## 2019-10-11 DIAGNOSIS — J441 Chronic obstructive pulmonary disease with (acute) exacerbation: Secondary | ICD-10-CM

## 2019-10-11 MED ORDER — CEFDINIR 300 MG PO CAPS: 300.0000 mg | ORAL_CAPSULE | Freq: Two times a day (BID) | ORAL | 0 refills | Status: DC

## 2019-10-11 NOTE — Telephone Encounter (Signed)
10/11/2019  I have sent another 7 days of Omnicef.  Please ensure patient has follow-up with our office within the next week to monitor for symptom improvement.  Hold MWF Azithromycin when taking omnicef.   Continue daily prednisone as outlined dose.  [name], FNP

## 2019-10-15 ENCOUNTER — Ambulatory Visit (INDEPENDENT_AMBULATORY_CARE_PROVIDER_SITE_OTHER): Payer: Medicare Other | Admitting: Pulmonary Disease

## 2019-10-15 ENCOUNTER — Encounter: Payer: Self-pay | Admitting: Pulmonary Disease

## 2019-10-15 ENCOUNTER — Ambulatory Visit: Payer: Medicare Other | Admitting: Pulmonary Disease

## 2019-10-15 DIAGNOSIS — J449 Chronic obstructive pulmonary disease, unspecified: Secondary | ICD-10-CM

## 2019-10-15 DIAGNOSIS — J441 Chronic obstructive pulmonary disease with (acute) exacerbation: Secondary | ICD-10-CM

## 2019-10-15 DIAGNOSIS — J9611 Chronic respiratory failure with hypoxia: Secondary | ICD-10-CM

## 2019-10-15 DIAGNOSIS — Z7952 Long term (current) use of systemic steroids: Secondary | ICD-10-CM

## 2019-10-15 DIAGNOSIS — Z7951 Long term (current) use of inhaled steroids: Secondary | ICD-10-CM

## 2019-10-15 DIAGNOSIS — Z87891 Personal history of nicotine dependence: Secondary | ICD-10-CM

## 2019-10-15 DIAGNOSIS — Z9981 Dependence on supplemental oxygen: Secondary | ICD-10-CM

## 2019-10-15 MED ORDER — FLUTTER DEVI: 0 refills | Status: DC

## 2019-10-15 NOTE — Addendum Note (Signed)
Addended by: Amado Coe on: 10/15/2019 09:11 AM   Modules accepted: Orders

## 2019-10-15 NOTE — Progress Notes (Signed)
Virtual Visit via Telephone Note  I connected with Francisco [name] on 10/15/19 at  9:30 AM EDT by telephone and verified that I am speaking with the correct person using two identifiers.  Location: Patient: Home Provider: [facility] - [address], [place], [place], [place] [address]   I discussed the limitations, risks, security and privacy concerns of performing an evaluation and management service by telephone and the availability of in person appointments. I also discussed with the patient that there may be a patient responsible charge related to this service. The patient expressed understanding and agreed to proceed.  Patient consented to consult via telephone: Yes People present and their role in pt care: Pt    History of Present Illness:  74 year old male former smoker followed in our office for COPD  PMH: Hypertension, osteoporosis, chronic PE Smoker/ Smoking History: Former smoker.  Quit smoking in 2019.  104-pack-year smoking history. Maintenance: Symbicort 160, Spiriva Respimat 2.5, 10 mg prednisone daily, daliresp, Azithro MWF  Pt of: [name]  Chief complaint: follow up after antibiotics    74 year old male former smoker followed in our office for COPD.  Patient has a history of recurrent exacerbations.  He is managed on Symbicort 160, Spiriva Respimat 2.5, Daliresp, typically Monday Wednesday Friday azithromycin.  This has been held due to patient currently taking Omnicef.  He feels that the second round of Omnicef is really help with clearing mucus as well as clearing out his congestion.  After finishing Omnicef he will resume his Monday Wednesday Friday azithromycin.  He is also maintained on daily prednisone.  Observations/Objective:  04/15/2019 - EKG - QTC - 424  04/07/2018-CTA- nonocclusive PE centrally involving branches of the right middle lobe and right lower lobe, no evidence of right heart strain  11/04/2017-echocardiogram-LV ejection  fraction of 65 to 38%, grade 1 diastolic dysfunction  9/37/3428-JGOTLXBWI function test- FVC 4.59 (92% predicted), postbronchodilator ratio 29, postbronchodilator FEV1 1.41 (40% predicted), DLCO 72  11/27/2018-chest x-ray- no active cardiopulmonary disease, COPD  04/15/2019-chest x-ray-pulmonary hyperinflation emphysema without acute abnormality of the lungs  07/07/2019-chest x-ray-no radiographic evidence of acute cardiopulmonary disease, chronic peribronchial cuffing and emphysematous changes compatible with COPD, extensive scarring in the apices of the lungs bilaterally, right greater than left, aortic arthrosclerosis   Social History   Tobacco Use  Smoking Status Former Smoker  . Packs/day: 2.00  . Years: 52.00  . Pack years: 104.00  . Types: Pipe, Cigarettes  . Start date: 10/06/1965  . Quit date: 10/06/2017  . Years since quitting: 2.0  Smokeless Tobacco Never Used  Tobacco Comment   04/07/2018 "smoked cigarettes years ago; stopped ~ 20 yr ago; stopped smoking pipe in 09/2017"   Immunization History  Administered Date(s) Administered  . Fluad Quad(high Dose 65+) 03/16/2019  . Influenza Split 04/24/2012  . Influenza Whole 05/08/2009, 04/08/2011  . Influenza, High Dose Seasonal PF 05/01/2017, 03/31/2018  . Influenza,inj,Quad PF,6+ Mos 04/27/2013, 04/15/2014, 04/24/2015  . Influenza,inj,quad, With Preservative 05/01/2018  . Influenza-Unspecified 05/13/2016  . Pneumococcal Conjugate-13 02/25/2014  . Pneumococcal Polysaccharide-23 08/15/2011, 12/19/2016  . Pneumococcal-Unspecified 03/08/2017  . Td 02/21/2010  . Tdap 11/05/2017  . Zoster 01/29/2012    Assessment and Plan:  Chronic respiratory failure with hypoxia ([place])  Plan:  Continue oxygen therapy as prescribed - 4L with exertion    COPD with acute exacerbation ([place]) Slowly resolving On second round of Omnicef Taking daily prednisone  Plan: Add flutter valve  Stage 4 very severe COPD by GOLD  classification  ([place]) Plan: [employer] Start flutter valve Continue Symbicort 160 Continue Spiriva Respimat 2.5 Continue 10 mg of daily prednisone Continue Daliresp After finishing Omnicef resume Monday Wednesday Friday azithromycin Continue to work with palliative care 2 to 4-week follow-up in either [facility] office with [name] or [name] FNP  Follow Up Instructions:  Return in about 4 weeks (around 11/12/2019), or if symptoms worsen or fail to improve, for Follow up with Dr. Purnell Shoemaker, Follow up with [name] FNP-C.   I discussed the assessment and treatment plan with the patient. The patient was provided an opportunity to ask questions and all were answered. The patient agreed with the plan and demonstrated an understanding of the instructions.   The patient was advised to call back or seek an in-person evaluation if the symptoms worsen or if the condition fails to improve as anticipated.  I provided 23 minutes of non-face-to-face time during this encounter.   Lauraine Rinne, NP

## 2019-10-15 NOTE — Patient Instructions (Addendum)
You were seen today by Lauraine Rinne, NP  for:   1. Chronic respiratory failure with hypoxia (HCC)  Continue oxygen therapy as prescribed  >>>maintain oxygen saturations greater than 88 percent  >>>if unable to maintain oxygen saturations please contact the office  >>>do not smoke with oxygen  >>>can use nasal saline gel or nasal saline rinses to moisturize nose if oxygen causes dryness   2. COPD with acute exacerbation ([place])  Finish omnicef  3. Stage 4 very severe COPD by GOLD classification (HCC)  Continue Symbicort 160 >>> 2 puffs in the morning right when you wake up, rinse out your mouth after use, 12 hours later 2 puffs, rinse after use >>> Take this daily, no matter what >>> This is not a rescue inhaler   Spiriva Respimat 2.5 >>> 2 puffs daily >>> Do this every day >>>This is not a rescue inhaler   Note your daily symptoms > remember "red flags" for COPD:   >>>Increase in cough >>>increase in sputum production >>>increase in shortness of breath or activity  intolerance.   If you notice these symptoms, please call the office to be seen.   Continue daily prednisone 10 mg  After finishing Omnicef resume Monday Wednesday Friday azithromycin  Continue to work with palliative care  We will add a flutter valve today:   Use a flutter valve 10 breaths twice a day or 4 to 5 breaths 4-5 times a day to help clear mucus out     Follow Up:    Return in about 4 weeks (around 11/12/2019), or if symptoms worsen or fail to improve, for Follow up with Dr. Purnell Shoemaker, Follow up with [name] FNP-C.   [place] or [place] office   Please do your part to reduce the spread of COVID-19:      Reduce your risk of any infection  and COVID19 by using the similar precautions used for avoiding the common cold or flu:  [name] Kitchen Wash your hands often with soap and warm water for at least 20 seconds.  If soap and water are not readily available, use an alcohol-based hand sanitizer  with at least 60% alcohol.  . If coughing or sneezing, cover your mouth and nose by coughing or sneezing into the elbow areas of your shirt or coat, into a tissue or into your sleeve (not your hands). Langley Gauss A MASK when in public  . Avoid shaking hands with others and consider head nods or verbal greetings only. . Avoid touching your eyes, nose, or mouth with unwashed hands.  . Avoid close contact with people who are sick. . Avoid places or events with large numbers of people in one location, like concerts or sporting events. . If you have some symptoms but not all symptoms, continue to monitor at home and seek medical attention if your symptoms worsen. . If you are having a medical emergency, call 911.   [place] / e-Visit: [email or web address]         [facility]: [facility]: [phone]                   [facility]: [phone]     It is flu season:   >>> Best ways to protect herself from the flu: Receive the yearly flu vaccine, practice good hand hygiene washing with soap and also using hand sanitizer when available, eat a nutritious meals, get adequate rest, hydrate  appropriately   Please contact the office if your symptoms worsen or you have concerns that you are not improving.   Thank you for choosing [facility] for your healthcare, and for allowing [place] to partner with you on your healthcare journey. I am thankful to be able to provide care to you today.   [name] FNP-C

## 2019-10-15 NOTE — Assessment & Plan Note (Signed)
Plan: [employer] Start flutter valve Continue Symbicort 160 Continue Spiriva Respimat 2.5 Continue 10 mg of daily prednisone Continue Daliresp After finishing Omnicef resume Monday Wednesday Friday azithromycin Continue to work with palliative care 2 to 4-week follow-up in either [facility] office with [name] or [name] FNP

## 2019-10-15 NOTE — Assessment & Plan Note (Signed)
Slowly resolving On second round of Omnicef Taking daily prednisone  Plan: Add flutter valve

## 2019-10-15 NOTE — Assessment & Plan Note (Signed)
  Plan:  Continue oxygen therapy as prescribed - 4L with exertion

## 2019-11-11 ENCOUNTER — Other Ambulatory Visit (INDEPENDENT_AMBULATORY_CARE_PROVIDER_SITE_OTHER): Payer: Medicare Other

## 2019-11-11 ENCOUNTER — Encounter: Payer: Self-pay | Admitting: Internal Medicine

## 2019-11-11 ENCOUNTER — Other Ambulatory Visit: Payer: Self-pay

## 2019-11-11 ENCOUNTER — Ambulatory Visit (INDEPENDENT_AMBULATORY_CARE_PROVIDER_SITE_OTHER): Payer: Medicare Other | Admitting: Internal Medicine

## 2019-11-11 VITALS — BP 142/70 | HR 100 | Temp 97.4°F | Ht 72.0 in | Wt 175.0 lb

## 2019-11-11 DIAGNOSIS — J449 Chronic obstructive pulmonary disease, unspecified: Secondary | ICD-10-CM

## 2019-11-11 DIAGNOSIS — J441 Chronic obstructive pulmonary disease with (acute) exacerbation: Secondary | ICD-10-CM

## 2019-11-11 DIAGNOSIS — J9611 Chronic respiratory failure with hypoxia: Secondary | ICD-10-CM

## 2019-11-11 DIAGNOSIS — R54 Age-related physical debility: Secondary | ICD-10-CM

## 2019-11-11 LAB — IGA: IgA: 236 mg/dL (ref 68–378)

## 2019-11-11 MED ORDER — BREZTRI AEROSPHERE 160-9-4.8 MCG/ACT IN AERO: 2.0000 | INHALATION_SPRAY | Freq: Two times a day (BID) | RESPIRATORY_TRACT | 0 refills | Status: DC

## 2019-11-11 MED ORDER — CEFDINIR 300 MG PO CAPS
300.0000 mg | ORAL_CAPSULE | Freq: Two times a day (BID) | ORAL | 0 refills | Status: DC
Start: 2019-11-11 — End: 2019-11-29

## 2019-11-11 MED ORDER — PREDNISONE 10 MG PO TABS: ORAL_TABLET | ORAL | 1 refills | Status: DC

## 2019-11-11 MED ORDER — SODIUM CHLORIDE 3 % IN NEBU: INHALATION_SOLUTION | Freq: Every day | RESPIRATORY_TRACT | 12 refills | Status: DC

## 2019-11-11 NOTE — Addendum Note (Signed)
Addended by: Lorretta Harp on: 11/11/2019 11:18 AM   Modules accepted: Orders

## 2019-11-11 NOTE — Progress Notes (Signed)
ROV 12/30/16 -- patient has a history of tobacco use, COPD currently maintained on chronic prednisone, Symbicort, Spiriva. Most recent blurry function testing was March 2011 with an FEV1 of 1.2 L (34% Pred), severe obstruction. He up titrates his prednisone on his own based on symptoms. No abx since last time. He has increased pred temporrily about 4 -5 x since last time. No increases for over a month. He is smoking a pipe, not cigarettes. He is using HCTZ more frequently these days. He is on flonase and singulair. He does note that he has some slow progression of his DOE. He coughs a few times a day, clears clear sputum.   rov 07/17/17 --this is a follow-up visit for patient with active tobacco use, severe COPD and chronic prednisone use.  He often titrates his prednisone on his own depending on how he is feeling. He complains of a new pain in his back below his left shoulder blade, has been present for the last 10 days. sometimes pleuritic, can be stabbing, worse w cough. His pred use: on 10mg  for the last 19 days. No hx VTE. He has LE edema, venous stasis changes, increased since his diuretics decreased.   ROV 09/23/17 --74 year old man with a history of tobacco use (still smokes pipe), severe COPD and severe obstruction on spirometry.  At his last visit he had some pleuritic left back pain and I performed a CT PA that I have reviewed from 07/17/17.  There was no pulmonary embolism but extensive emphysema present, some bilateral apical scar more so on the right, stable.  He uses prednisone 10 mg daily and uptitrate depending on his day-to-day symptoms.  He is otherwise managed on Symbicort and Spiriva.  Desaturated on arrival today after ambulating to the exam room. He reports more dyspnea, has increased pred intermittently.    OV 12/05/2017  Chief Complaint  Patient presents with  . Follow-up    Switching from RB to MR.   Pt is on 3L with exertion and 2L at rest. Pt states his breathing had  become worse but since he has been on O2 since 3/21, it has really helped with his breathing.  Pt was in hospital 4/28-5/3. Other than SOB, pt does have c/o cough, rattling in chest. Denies any CP/chest tightness.   History from patient, his wife and review of the old chart  Francisco Coventry IIIs a transfer of care from [name] to myself [name].  His son is my patient and therefore he is done this transfer of care.  According to the patient he is to be seen by [name] for COPD.  Patient is FEV1 34% based on March 2011 PFTs according to chart review.  At baseline he is maintained on Spiriva, Symbicort, Singulair, chronic daily prednisone 10 mg/day for at least 3 years, and 3 times a week of azithromycin for 2 years.  Earlier this year in March 2019 he went on daytime oxygen and following a recent hospitalization April-May 2019.  For syncope that was believed due to nocturnal hypoxemia he was started on night oxygen.  Since starting oxygen his edema has improved and his overall quality of life is improved and he stopped having any orthostasis or presyncopal episodes.  He is grateful for the fact that he is on oxygen.  His current COPD CAT score and severity of symptoms is rated below and is 26.  Show significant amount of symptom burden.  His main goal is to improve his  quality of life.  He had his wife have many questions centering around quality of life, medication therapy.  Of note he has had some thoracic vertebral fractures that are new.  This happened after the fall in late April 2019.  Discovered at admission last month.  He feels is due to prednisone.  He is wondering about portable oxygen.   OV 01/29/2018  Chief Complaint  Patient presents with  . Follow-up    Pt states he has been doing okay since last visit. States breathing is about the same, has an occ cough but states the O2 has helped out a lot with breathing and cough. Denies any complaints of CP.   Francisco [name] , 74 y.o. ,  with dob [birth year] and male ,Not Hispanic or Latino from [place] [id number] - presents to lung clinic for advanced COPD follow-up. Since his last visit his symptoms course of improvement. Score is 26 and severe symptoms of document below. He is on Spiriva, Symbicort, continues oxygen, daily prednisone and azithromycin 3 times a week. He has stopped his singulair without any problems. Overall he says he is better. He is willing to give TRELEGY inhaler trial. Last visit to check blood gas and he does not have hypercapnia so he does not qualify for BiPAP        OV 04/03/2018  Subjective:  Patient ID: Francisco [name], male , DOB: [birth year] , age 74 y.o. , MRN: [id number] , ADDRESS: [place] [address]   04/03/2018 -   Chief Complaint  Patient presents with  . Acute Visit    Pt's O2 sats have been dropping into the 70s with little exertion.  Pt took O2 off just to shave 9/26 and after he finished shaving put the O2 back on, walked from the bathroom down to the living room on O2 and sats were at 77% on 3L 9/26. Pt also has c/o cough with white to yellow mucus and chest tightness with the SOB.     HPI Francisco [name] 74 y.o. -acute visit for this patient.  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  He is chronically prednisone dependent.  He also is on schedule azithromycin.  He called in yesterday feeling unwell therefore we asked him to come in today.  He tells me that approximately a week ago he started having increased cough and congestion.  Yesterday primary care physician following a routine visit thought he was in COPD exacerbation and started him on doxycycline.  He also personally bumped up his baseline prednisone.  He does me that yesterday while he was shaving on room air he felt more short of breath than usual.  Also when he walked he desaturated more than usual therefore he decided to call in and come in today.   There is no fever or chills or hemoptysis or colored sputum.  No leg edema.  No orthopnea.  He does not feel like he needs to be in the emergency department or get admitted.   OV 05/11/2018  Subjective:  Patient ID: Francisco [name], male , DOB: Apr 21, 1946 , age 71 y.o. , MRN: [id number] , ADDRESS: [place] [address]   05/11/2018 -   Chief Complaint  Patient presents with  . Follow-up    pt states breathing has wrosen since last OV. increased sob, occ chest tightness, prod cough with yellow mucus &  chest congestion. currently taking trelegy samples. recent admission 04/07/18 for PE.     HPI Francisco [name] 74 y.o. -advanced COPD with chronic hypoxemic respiratory failure [not a BiPAP candidate because of lack of hypercapnia] presents with his wife for follow-up.  After the last visit he continued to have symptoms so on April 07, 2018 he got an outpatient CT scan and he had pulmonary embolism.  He got admitted to the hospital and then discharged and since then has been on Xarelto.  He continues his baseline 2-3 L of nasal cannula oxygen but he says since then he is more symptomatic.  Is also corresponds with him starting Trelegy inhaler and also for the last few to several weeks is having increased cough and congestion and yellow phlegm.  His COPD CAT score is deteriorated to 35 and above.  He is very frustrated by his symptoms.  Review of his medication shows that he takes azithromycin and chronic prednisone and Trelegy and oxygen for his COPD.  He is on Xarelto for his blood clots.  He is also on Fosamax for prednisone dependent osteoporosis management.  His smoking is in remission.  Wife says he is always sedentary.  Apparently they tried to do some physical therapy a month ago but he desaturated.  He does not seem to keen on physical therapy right now     OV 06/10/2018  Subjective:  Patient ID: Francisco [name], male , DOB: 22-Mar-1946 , age 41 y.o. , MRN: [id number] ,  ADDRESS: [place] [address]   06/10/2018 -   Chief Complaint  Patient presents with  . Follow-up    follows for COPD. patient has increased SOB with exertion     HPI Francisco [name] 74 y.o. -returns for gold stage IV COPD follow-up with chronic hypoxemic respiratory failure.  I saw him approximately a month ago at which time recommended Trelegy.  However the Trelegy is not working well for him.  He feels Spiriva and Symbicort is of benefit for him.  Also recommended he start himself on Daliresp.  Given the GI side effect profile I communicated with his primary care physician about stopping Fosamax.  His primary care physician has advised Reclast infusion for him and is considering this but has not yet switched over.  This because the infusions will have to happen at [facility].  However he never started his Daliresp because he read the side effect profile and was worried about back pain.  He wanted me to go over the side effect profile of Daliresp all over again.  I printed up-to-date and went over all the side effects.  I did this with him and his wife.  There is a 3% incidence of backache.  The dominant feature is GI issues of weight loss and diarrhea and nausea and abdominal pain.  The second dominant feature is some anxiety issues.  After reading all this he has decided to start [place].  He is aware of the limitations as well.  He is aware that this is purely preventive in reducing COPD exacerbation frequency.  He understands that the burden on his health from frequent COPD exacerbations is high.  Currently COPD CAT score is back at baseline        OV 07/22/2018  Subjective:  Patient ID: Francisco [name], male , DOB: [birth year] , age 84 y.o. , MRN: [id number] , ADDRESS: [place] [address]   07/22/2018 -  Chief Complaint  Patient presents with  . Follow-up    Pt states it has been rough since last visit. States he has been coughing with  white to yellow phlegm, wheezing, increased SOB which has been going on x2-3 weeks now. Pt denies any real complaints of chest tightness/chest pain.     HPI Francisco [name] 74 y.o. -presents for follow-up of his advanced COPD with chronic hypoxemic respiratory failure.  At last visit we introduce Daliresp as is an effort to prevent COPD flareups.  After some trepidation he started taking it.  He is currently on full dose 5 mcg daily and is tolerating it well.  However he does not think it is reduced the frequency of flareups but again it is too soon to tell.  Approximately over a week ago after some sick contact exposure he had symptoms and signs of COPD exacerbation.  He was given I suspect Levaquin but this caused some confusion.  After that he was changed to doxycycline.  Today's his last of 7 days with doxycycline he is better.  COPD CAT score is improved to 26 but he still is coughing quite a bit and has mucus and feels that he is still not fully back to baseline.  He had a chest x-ray July 13, 2018 that reports this is clear.  He continues on oxygen Spiriva and Symbicort.  There are no other new issues.  No chest pain edema hemoptysis or weight loss.    OV 03/05/2019  Subjective:  Patient ID: Francisco [name], male , DOB: 11-18-1945 , age 23 y.o. , MRN: [id number] , ADDRESS: [place] [address]   03/05/2019 -   Chief Complaint  Patient presents with  . COPD with acute exacerbation    Feels it is a little worse since June. Has cough with congestion, was white in color. But this morning it has a yellow and green color to it.     HPI Francisco [name] 74 y.o. -returns for his advanced COPD follow-up.  He says that since his last visit he has been doing overall fair and stable.  Uses 3 L of oxygen, Daliresp, Spiriva, Symbicort.  He has been avoiding risk activities for COVID-19.  He says in the last few weeks he has had increased congestion he does not think this is  COVID-19.  Today he has green-yellow sputum.  He is asking about taking a flu shot.    OV 05/07/2019  Subjective:  Patient ID: Francisco [name], male , DOB: [birth year] , age 74 y.o. , MRN: [id number] , ADDRESS: [place] [address]   05/07/2019 -   Chief Complaint  Patient presents with  . Follow-up    Pt states his breathing is at his baseline. Pt c/o prod cough with clear mucus - baseline for pt. Pt denies CP/tightness and f/c/s.    Advance COPD with chronic hypoxemic respiratory failure.  Baseline functional status ECOG 3-4  HPI Francisco [name] 74 y.o. -presents for COPD follow-up.  He continues to be stable using oxygen.  His current COPD CAT score is 30 which is his baseline.  He recently had a vascular surgery.  He is on oxygen, prednisone, Daliresp, Spiriva and Symbicort.  He is gone back to taking azithromycin for prophylaxis m again COPD exacerbation.        OV 11/11/2019  Subjective:  Patient ID: Francisco [name], male , DOB: 02-12-1946 ,  age 53 y.o. , MRN: [id number] , ADDRESS: [place] [address]   11/11/2019 -   Chief Complaint  Patient presents with  . Follow-up    Pt states his breathing is progressively becoming worse and states he feels like he may be in a flare with the COPD. Pt states he is coughing up yellow-green phlegm and also has complaints of wheezing.   Advance COPD patient with recurrent COPD exacerbations.  HPI Francisco [name] 74 y.o. -presents for follow-up.  He says last month he saw [name] nurse practitioner.  He says he was given a different antibiotic that really helped.  Review of the records indicate one time it was Augmentin another time it was [place].  He believes the second 1 really helped.  He feels in another exacerbation.  He is having green sputum.  He says with the flutter valve and exacerbation a lot of sputum comes out but between exacerbations is very minimal.  He is frustrated by his  recurrent exacerbations.  He did not tolerate Trelegy in the past.  He is on Spiriva, Symbicort, oxygen 3 L, flutter valve, Flonase, Roflumilast and 3 times a week of azithromycin.  He is looking to see if he can improve his quality of life.  His COPD CAT score is 34.  His last CT scan of the chest was in 2019.     CAT COPD Symptom & Quality of Life Score (GSK trademark) 0 is no burden. 5 is highest burden 12/05/2017  01/29/2018  05/11/2018  06/10/2018  07/22/2018  03/05/2019  05/07/2019   Never Cough -> Cough all the time 3 1 5 5 2 3 3   No phlegm in chest -> Chest is full of phlegm 2 2 4 5 3  3.5 3  No chest tightness -> Chest feels very tight 1 0 3 0 1 2 3   No dyspnea for 1 flight stairs/hill -> Very dyspneic for 1 flight of stairs 5 4 5 5 5 5 5   No limitations for ADL at home -> Very limited with ADL at home 5 5 5 5 5 5 4   Confident leaving home -> Not at all confident leaving home 3 3 3 4 4 3 4   Sleep soundly -> Do not sleep soundly because of lung condition 3 1 2  0 1 4 4   Lots of Energy -> No energy at all 4 4 5 5 5 4 4   TOTAL Score (max 40)  26 20 37 29 26 29.5 30   CAT Score 11/11/2019 03/05/2019 04/03/2018  Total CAT Score 32 30 30      ROS - per HPI     has a past medical history of Chest x-ray abnormality, Chronic back pain, Chronic rhinitis, Compressed spine fracture (HCC), COPD (chronic obstructive pulmonary disease) ([place]), Dyspnea, Emphysema lung ([place]), Emphysema of lung ([place]), On home oxygen therapy, Onychomycosis, Orthostatic hypotension, PAD (peripheral artery disease) ([place]), Pneumonia, Pulmonary embolism ([place]) (04/07/2018), Skin cancer, and Vertigo.   reports that he quit smoking about 2 years ago. His smoking use included pipe and cigarettes. He started smoking about 54 years ago. He has a 104.00 pack-year smoking history. He has never used smokeless tobacco.  Past Surgical History:  Procedure Laterality Date  . APPENDECTOMY    . ENDARTERECTOMY FEMORAL Right  04/19/2019   Procedure: ENDARTERECTOMY RIGHT COMMON FEMORAL;  Surgeon: Elam Dutch, MD;  Location: [facility];  Service: Vascular;  Laterality: Right;  . LOWER EXTREMITY ANGIOGRAPHY  12/11/2018  . LOWER EXTREMITY ANGIOGRAPHY N/A 12/11/2018   Procedure: LOWER EXTREMITY ANGIOGRAPHY;  Surgeon: Elam Dutch, MD;  Location: [facility];  Service: Cardiovascular;  Laterality: N/A;  . PATCH ANGIOPLASTY Right 04/19/2019   Procedure: Patch Angioplasty;  Surgeon: Elam Dutch, MD;  Location: [facility];  Service: Vascular;  Laterality: Right;  . PERIPHERAL VASCULAR INTERVENTION Right 12/11/2018   Procedure: PERIPHERAL VASCULAR INTERVENTION;  Surgeon: Elam Dutch, MD;  Location: [facility];  Service: Cardiovascular;  Laterality: Right;  Common Iliac   . SKIN CANCER EXCISION     "lips, face, ears, arms" (04/07/2018)  . TONSILLECTOMY      Allergies  Allergen Reactions  . Tape Other (See Comments)    SKIN IS VERY THIN; CAN ONLY USE COBAN WRAPS DUE TO CONDITION OF SKIN!!  . Levaquin [Levofloxacin]     hallucinations    Immunization History  Administered Date(s) Administered  . Fluad Quad(high Dose 65+) 03/16/2019  . Influenza Split 04/24/2012  . Influenza Whole 05/08/2009, 04/08/2011  . Influenza, High Dose Seasonal PF 05/01/2017, 03/31/2018  . Influenza,inj,Quad PF,6+ Mos 04/27/2013, 04/15/2014, 04/24/2015  . Influenza,inj,quad, With Preservative 05/01/2018  . Influenza-Unspecified 05/13/2016  . PFIZER SARS-COV-2 Vaccination 07/30/2019, 08/20/2019  . Pneumococcal Conjugate-13 02/25/2014  . Pneumococcal Polysaccharide-23 08/15/2011, 12/19/2016  . Pneumococcal-Unspecified 03/08/2017  . Td 02/21/2010  . Tdap 11/05/2017  . Zoster 01/29/2012    Family History  Problem Relation Age of Onset  . Heart disease Mother        CABG in her 92s, nonsmoker  . Cancer Mother   . Stroke Father   . Heart disease Father        Died of MI at age 25, smoker  . Hepatitis Sister   .  Coronary artery disease Other        male 1st degree relative <60     Current Outpatient Medications:  .  acetaminophen (TYLENOL) 500 MG tablet, Take 1,000 mg by mouth 2 (two) times daily. , Disp: , Rfl:  .  albuterol (VENTOLIN HFA) 108 (90 Base) MCG/ACT inhaler, USE 2 PUFFS EVERY 6 HOURS  AS NEEDED FOR WHEEZING, Disp: 18 g, Rfl: 3 .  azithromycin (ZITHROMAX) 250 MG tablet, Take 1 tablet (250 mg total) by mouth every Monday, Wednesday, and Friday., Disp: 45 tablet, Rfl: 1 .  budesonide-formoterol (SYMBICORT) 160-4.5 MCG/ACT inhaler, Inhale 2 puffs into the lungs 2 (two) times daily., Disp: 30 g, Rfl: 3 .  Calcium Carbonate-Vitamin D (CALCIUM 600+D PO), Take 2 tablets by mouth daily., Disp: , Rfl:  .  cycloSPORINE (RESTASIS) 0.05 % ophthalmic emulsion, 1 drop 2 (two) times daily., Disp: , Rfl:  .  fluticasone (FLONASE) 50 MCG/ACT nasal spray, Place 2 sprays into both nostrils daily., Disp: 16 g, Rfl: 3 .  furosemide (LASIX) 20 MG tablet, TAKE 1 TABLET BY MOUTH  DAILY, Disp: 90 tablet, Rfl: 3 .  Misc. Devices (PULSE OXIMETER FOR FINGER) MISC, 1 Device by Does not apply route as needed., Disp: 1 each, Rfl: 0 .  OXYGEN, Inhale 3 L into the lungs continuous. , Disp: , Rfl:  .  predniSONE (DELTASONE) 10 MG tablet, Take 1 tablet (10 mg total) by mouth daily with breakfast., Disp: 90 tablet, Rfl: 0 .  Respiratory Therapy Supplies (FLUTTER) DEVI, 10 times Twice a day and prn as needed, may increase if feeling worse, Disp: 1 each, Rfl: 0 .  rivaroxaban (XARELTO) 20 MG TABS tablet, TAKE 1 TABLET BY MOUTH  DAILY,  Disp: 90 tablet, Rfl: 3 .  roflumilast (DALIRESP) 500 MCG TABS tablet, Take 1 tablet (500 mcg total) by mouth daily., Disp: 90 tablet, Rfl: 2 .  SPIRIVA RESPIMAT 2.5 MCG/ACT AERS, USE 2 INHALATIONS BY MOUTH  ONCE DAILY (Patient taking differently: Inhale 2 puffs into the lungs daily. ), Disp: 12 g, Rfl: 3 .  traMADol (ULTRAM) 50 MG tablet, Take 1 tablet (50 mg total) by mouth every 12 (twelve)  hours as needed for moderate pain or severe pain., Disp: 30 tablet, Rfl: 1      Objective:   Vitals:   11/11/19 1027  BP: (!) 142/70  Pulse: 100  Temp: (!) 97.4 F (36.3 C)  TempSrc: Temporal  SpO2: 96%  Weight: 175 lb (79.4 kg)  Height: 6' (1.829 m)    Estimated body mass index is 23.73 kg/m as calculated from the following:   Height as of this encounter: 6' (1.829 m).   Weight as of this encounter: 175 lb (79.4 kg).  @WEIGHTCHANGE @  [employer]   11/11/19 1027  Weight: 175 lb (79.4 kg)     Physical Exam Frail deconditioned male sitting in the wheelchair with oxygen on.  Barrel chested with baseline pursed lip breathing.  Overall diminisheee air entry with prolonged expiration.  No obvious wheezing.  Normal heart sounds.  Chronic pedal edema present.  Has mild cushingoid facies.      Assessment:       ICD-10-CM   1. COPD with acute exacerbation ([facility])  J44.1   2. Chronic respiratory failure with hypoxia (HCC)  J96.11   3. Stage 4 very severe COPD by GOLD classification ([place])  J44.9   4. Frailty  R54        Plan:     Patient Instructions     ICD-10-CM   1. COPD with acute exacerbation ([facility])  J44.1   2. Chronic respiratory failure with hypoxia (HCC)  J96.11   3. Stage 4 very severe COPD by GOLD classification ([facility])  J44.9   4. Frailty  R54     Take omnicef 300mg  bid x 5 days  Please take Take prednisone 40mg  once daily x 3 days, then 30mg  once daily x 3 days, then 20mg  once daily x 3 days, then prednisone 10mg  once daily  To contginue baseline dose  Give sputum for culture  STart 38mL of 3% saline neb once daily - get from [facility] flutter valve  Get CT chest without contrast  Check immunoglobulins -IgG, IgA, IgM and IgG  Change Spiriva and Symbicort to BEZTRI  2 puffs 2 times daily  -Take sample for 4 weeks and then let us know the response  Continue oxygen as before 3 L  Continue home palliative support   Continue  Flonase  Continue daily Roflumilast and azithromycin Monday Wednesday Friday as per prior schedule  Follow-up -15-minute phone visit with [name] in 2 weeks or with [facility] practitioner     (Level 04: Estb 30-39 min   visit type: on-site physical face to visit visit spent in total care time and counseling or/and coordination of care by this undersigned MD - Dr Brand Males. This includes one or more of the following on this same day 11/11/2019: pre-charting, chart review, note writing, documentation discussion of test results, diagnostic or treatment recommendations, prognosis, risks and benefits of management options, instructions, education, compliance or risk-factor reduction. It excludes time spent by the [facility] or office staff in the care of the  patient . Actual time is 32 min)   SIGNATURE    Dr. Brand Males, M.D., F.C.C.P,  Pulmonary and Critical Care Medicine Staff Physician, [place] Director - Interstitial Lung Disease  Program  Pulmonary [facility] at [place], [place], [address]  Pager: [phone], If no answer or between  15:00h - 7:00h: call [phone] Telephone: [phone]  10:56 AM 11/11/2019

## 2019-11-11 NOTE — Patient Instructions (Addendum)
ICD-10-CM   1. COPD with acute exacerbation ([facility])  J44.1   2. Chronic respiratory failure with hypoxia (HCC)  J96.11   3. Stage 4 very severe COPD by GOLD classification ([facility])  J44.9   4. Frailty  R54     Take omnicef 300mg  bid x 5 days  Please take Take prednisone 40mg  once daily x 3 days, then 30mg  once daily x 3 days, then 20mg  once daily x 3 days, then prednisone 10mg  once daily  To contginue baseline dose  Give sputum for culture  STart 30mL of 3% saline neb once daily - get from [facility] flutter valve  Get CT chest without contrast  Check immunoglobulins -IgG, IgA, IgM and IgG  Change Spiriva and Symbicort to BEZTRI  2 puffs 2 times daily  -Take sample for 4 weeks and then let us know the response  Continue oxygen as before 3 L  Continue home palliative support   Continue Flonase  Continue daily Roflumilast and azithromycin Monday Wednesday Friday as per prior schedule  Follow-up -15-minute phone visit with [name] in 2 weeks or with [name] nurse practitioner

## 2019-11-12 ENCOUNTER — Ambulatory Visit: Payer: Medicare Other | Admitting: Internal Medicine

## 2019-11-12 ENCOUNTER — Telehealth: Payer: Self-pay | Admitting: Internal Medicine

## 2019-11-12 ENCOUNTER — Other Ambulatory Visit: Payer: Self-pay | Admitting: *Deleted

## 2019-11-12 DIAGNOSIS — J449 Chronic obstructive pulmonary disease, unspecified: Secondary | ICD-10-CM

## 2019-11-12 DIAGNOSIS — J441 Chronic obstructive pulmonary disease with (acute) exacerbation: Secondary | ICD-10-CM

## 2019-11-12 DIAGNOSIS — J9611 Chronic respiratory failure with hypoxia: Secondary | ICD-10-CM

## 2019-11-12 LAB — IGG: IgG (Immunoglobin G), Serum: 777 mg/dL (ref 600–1540)

## 2019-11-12 LAB — IGM: IgM, Serum: 142 mg/dL (ref 50–300)

## 2019-11-12 NOTE — Progress Notes (Signed)
Immunoglobulin levels are all normal

## 2019-11-12 NOTE — Progress Notes (Signed)
Spoke with pt and let him know his immunoglobin levels were normal.  Pt verbalized understanding.  No questions.

## 2019-11-12 NOTE — Telephone Encounter (Signed)
Called spoke with patient. Let him know our nurse practitioner states we can send in nebulizer machine order.  Order placed.   Nothing further needed at this time.

## 2019-11-15 ENCOUNTER — Other Ambulatory Visit: Payer: Medicare Other

## 2019-11-15 DIAGNOSIS — J449 Chronic obstructive pulmonary disease, unspecified: Secondary | ICD-10-CM

## 2019-11-15 DIAGNOSIS — J441 Chronic obstructive pulmonary disease with (acute) exacerbation: Secondary | ICD-10-CM

## 2019-11-16 ENCOUNTER — Telehealth: Payer: Self-pay | Admitting: Internal Medicine

## 2019-11-16 NOTE — Telephone Encounter (Signed)
Patient unsure of when to use saline in neb tx. Before or after using inhalers. Informed use morning around same time. Nothing further needed.

## 2019-11-17 ENCOUNTER — Telehealth: Payer: Self-pay | Admitting: Internal Medicine

## 2019-11-17 ENCOUNTER — Other Ambulatory Visit: Payer: Medicare Other | Admitting: Hospice

## 2019-11-17 ENCOUNTER — Other Ambulatory Visit: Payer: Self-pay

## 2019-11-17 DIAGNOSIS — J449 Chronic obstructive pulmonary disease, unspecified: Secondary | ICD-10-CM

## 2019-11-17 DIAGNOSIS — Z515 Encounter for palliative care: Secondary | ICD-10-CM

## 2019-11-17 NOTE — Progress Notes (Signed)
[facility] Palliative Care Consult Note Telephone: [phone]  Fax: [phone]  PATIENT NAME: Francisco [name] DOB: 03/19/1946 MRN: [id number]  PRIMARY CARE PROVIDER:   [name], MD  REFERRING PROVIDER: [name], MD   RESPONSIBLE PARTY:   Self [phone] [address] [place] [address] ContactKaty [name] ( spouse) [phone] c   RECOMMENDATIONS/PLAN:   Advance Care Planning/Goals of Care:  Visit at the request of Francisco [name] for palliative consult. Visit consisted of building trust and and follow-up on patient's status. Patient affirmed he is a DNR. Signed DNR form is uploaded in [place]. Goals of care include to maximize quality of life and symptom management.  Extensive discussion on the MOST form.  Spouse present during visit.  Patient asked if filling out the MOST form will preclude his getting treatments when he needs them.  He was assured that the MOST form is designed to give him the opportunity to direct his care; he will continue to get the care that he needs.  MOST form selections today by patient  include do not attempt resuscitation, limited additional intervention, antibiotics if indicated, IV fluids if indicated, no feeding tube.  Signed MOST form at home with patient; same uploaded to epic today.  Discussed the prospect of hospice services in the future.  Family open to further discussion on hospice service. Symptom management: Patient with ongoing COPD exacerbation  Saw Pulmonologist 11/11/2019; was started on prednisone pack and antibiotic cefdinir.  He is taking the medications as ordered.  Lilly reports that the pulmonologist started patient on days Breztri instead of Spiriva and Symbicort for a trial.  Patient said so far the inhaler is helpful.  Patient was also started of 3% saline nebulization once daily with the hope that this will help to clear up with phlegm in his lungs. CT scan for 11/25/2019.  Patient continues on continuous 02 3L/Mi: helpful; no difficulty breathing/speaking during visit. Discussed proper breathing, resting in between activities and taking breathing treatments and antibiotics as ordered. When not in exacerbation.  Prior to current COPD exacerbation, it was managed with Spiriva, Symbicort, Albuterol, Azythromycin 3 x a week, daily 10mg  Prednisone, lasix. Patient is ambulatory, SOB when walking and on moderate exertion; he is compliant with his medications. Patient on Xarelto to prevent pulmonary embolism. Swelling in bilateral legs is ongoing r/t PAD with hx of Right common femoral endarterectomy bovine patch angioplasty. Compression socks and coban wraps tried in the past - not effective. Francisco [name] is his Vein doctor; and he follows up scheduled.  Follow up: Palliative care will continue to follow patient for goals of care clarification and symptom management. I spent one hour and 25 minutes providing this consultation; time includes chart review and documentation. More than 50% of the time in this consultation was spent on coordinating communication  HISTORY OF PRESENT ILLNESS:  Francisco [name] is a 74 y.o. year old male with multiple medical problems including stage 4 COPD, PAD chronic pulmonary embolism. Palliative Care was asked to help address goals of care.   CODE STATUS: DNR  PPS: 50%.   HOSPICE ELIGIBILITY/DIAGNOSIS: TBD  PAST MEDICAL HISTORY:  Past Medical History:  Diagnosis Date  . Chest x-ray abnormality   . Chronic back pain    "mid and lower" (04/07/2018)  . Chronic rhinitis    -Sinus Ct 08/01/2009 >> Bilateral maxillary sinusitis with some mucosal thickeningin the sphenoid and frontal sinuses as well  with air fluid levels present -chronic rhinitis flyer Aug 04, 2009  . Compressed spine fracture ([place])   . COPD (chronic obstructive pulmonary disease) [facility])    PFT's rec Jul 17, 2009  . Dyspnea   . Emphysema lung ([place])   . Emphysema of lung ([place])     . On home oxygen therapy    "3L; 24/7" (04/07/2018)  . Onychomycosis    Francisco [name]  . Orthostatic hypotension    "since 10/2017" (04/07/2018)  . PAD (peripheral artery disease) ([place])   . Pneumonia    "twice in 1 year" (04/07/2018)  . Pulmonary embolism ([place]) 04/07/2018  . Skin cancer    "lips, face, ears, arms" (04/07/2018)  . Vertigo    "since ~ 02/2018" (04/07/2018)    SOCIAL HX:  Social History   Tobacco Use  . Smoking status: Former Smoker    Packs/day: 2.00    Years: 52.00    Pack years: 104.00    Types: Pipe, Cigarettes    Start date: 10/06/1965    Quit date: 10/06/2017    Years since quitting: 2.1  . Smokeless tobacco: Never Used  . Tobacco comment: 04/07/2018 "smoked cigarettes years ago; stopped ~ 20 yr ago; stopped smoking pipe in 09/2017"  Substance Use Topics  . Alcohol use: Yes    Alcohol/week: 28.0 standard drinks    Types: 28 Cans of beer per week    Comment: "04/15/2019 "4 beers/day; probably closer to 28/wk"    ALLERGIES:  Allergies  Allergen Reactions  . Tape Other (See Comments)    SKIN IS VERY THIN; CAN ONLY USE COBAN WRAPS DUE TO CONDITION OF SKIN!!  . Levaquin [Levofloxacin]     hallucinations     PERTINENT MEDICATIONS:  Outpatient Encounter Medications as of 11/17/2019  Medication Sig  . acetaminophen (TYLENOL) 500 MG tablet Take 1,000 mg by mouth 2 (two) times daily.   [name] Kitchen albuterol (VENTOLIN HFA) 108 (90 Base) MCG/ACT inhaler USE 2 PUFFS EVERY 6 HOURS  AS NEEDED FOR WHEEZING  . azithromycin (ZITHROMAX) 250 MG tablet Take 1 tablet (250 mg total) by mouth every Monday, Wednesday, and Friday.  . Budeson-Glycopyrrol-Formoterol (BREZTRI AEROSPHERE) 160-9-4.8 MCG/ACT AERO Inhale 2 puffs into the lungs in the morning and at bedtime.  . budesonide-formoterol (SYMBICORT) 160-4.5 MCG/ACT inhaler Inhale 2 puffs into the lungs 2 (two) times daily.  . Calcium Carbonate-Vitamin D (CALCIUM 600+D PO) Take 2 tablets by mouth daily.  . cefdinir (OMNICEF) 300 MG capsule  Take 1 capsule (300 mg total) by mouth 2 (two) times daily.  . cycloSPORINE (RESTASIS) 0.05 % ophthalmic emulsion 1 drop 2 (two) times daily.  . fluticasone (FLONASE) 50 MCG/ACT nasal spray Place 2 sprays into both nostrils daily.  . furosemide (LASIX) 20 MG tablet TAKE 1 TABLET BY MOUTH  DAILY  . Misc. Devices (PULSE OXIMETER FOR FINGER) MISC 1 Device by Does not apply route as needed.  . OXYGEN Inhale 3 L into the lungs continuous.   . predniSONE (DELTASONE) 10 MG tablet Take 1 tablet (10 mg total) by mouth daily with breakfast.  . predniSONE (DELTASONE) 10 MG tablet Take 4x3days, 3x3days, 2x3days, then continue on 10mg  daily  . Respiratory Therapy Supplies (FLUTTER) DEVI 10 times Twice a day and prn as needed, may increase if feeling worse  . rivaroxaban (XARELTO) 20 MG TABS tablet TAKE 1 TABLET BY MOUTH  DAILY  . roflumilast (DALIRESP) 500 MCG TABS tablet Take 1 tablet (500 mcg total) by mouth daily.  . sodium chloride  HYPERTONIC 3 % nebulizer solution Take by nebulization daily.  [name] Kitchen SPIRIVA RESPIMAT 2.5 MCG/ACT AERS USE 2 INHALATIONS BY MOUTH  ONCE DAILY (Patient taking differently: Inhale 2 puffs into the lungs daily. )  . traMADol (ULTRAM) 50 MG tablet Take 1 tablet (50 mg total) by mouth every 12 (twelve) hours as needed for moderate pain or severe pain.   No facility-administered encounter medications on file as of 11/17/2019.    PHYSICAL EXAM/ROS  General: NAD, cooperative Cardiovascular: regular rate and rhythm; denies chest pain/discomfort Pulmonary: Expiratory wheezing auscultated, no shortness of breath, patient on oxygen supplementation 3 L/min Abdomen: soft, nontender, + bowel sounds GU: no suprapubic tenderness Extremities: 1+ pitting edema to bilateral lower extremities Skin: Fragile skin that bruises easily Neurological: Weakness but otherwise nonfocal  Teodoro Spray, NP

## 2019-11-17 NOTE — Telephone Encounter (Signed)
Sputum growing pseudomonas  Plan  - arrange tele or video visit in 2-4 days with APP (sensitivities should be back by then) to discuss antibiotics for the current and also possible rotational antibotic regimen    Recent Results (from the past 240 hour(s))  Respiratory or Resp and Sputum Culture     Status: Abnormal (Preliminary result)   Collection Time: 11/15/19 10:36 AM   Specimen: Sputum  Result Value Ref Range Status   MICRO NUMBER: [id number]  Preliminary   SPECIMEN QUALITY: Adequate  Preliminary   Source SPUTUM  Preliminary   STATUS: PRELIMINARY  Preliminary   GRAM STAIN:   Preliminary    No white blood cells seen No epithelial cells seen No organisms seen   ISOLATE 3: Pseudomonas aeruginosa (A)  Preliminary    Comment: Pseudomonas aeruginosa

## 2019-11-18 NOTE — Telephone Encounter (Signed)
Called and spoke with pt letting him know the info stated by MR about sputum culture results and stated that we needed to schedule televisit with APP to discuss. Pt verbalized understanding and has been scheduled appt with [name] tomorrow 5/14 at 11:30 for televisit. Nothing further needed.

## 2019-11-18 NOTE — Progress Notes (Signed)
Virtual Visit via Telephone Note  I connected with Francisco [name] on 11/19/19 at 11:30 AM EDT by telephone and verified that I am speaking with the correct person using two identifiers.  Location: Patient: Home Provider: Office [facility] - [address], [place], [place], [place] [address]   I discussed the limitations, risks, security and privacy concerns of performing an evaluation and management service by telephone and the availability of in person appointments. I also discussed with the patient that there may be a patient responsible charge related to this service. The patient expressed understanding and agreed to proceed.  Patient consented to consult via telephone: Yes People present and their role in pt care: Pt   History of Present Illness:  74 year old male former smoker followed in our office for COPD  PMH: Hypertension, osteoporosis, chronic PE Smoker/ Smoking History: Former smoker.  Quit smoking in 2019.  104-pack-year smoking history. Maintenance: Symbicort 160, Spiriva Respimat 2.5, 10 mg prednisone daily, daliresp, Azithro MWF  Pt of: [name]  Chief complaint: Follow up regarding sputum results   74 year old male former smoker followed in our office for COPD.  Patient has history of frequent exacerbations.  He was most recently treated for a COPD exacerbation by [name] on 11/15/2019.  He was treated with Omnicef.  He feels that this has helped some with his cough.  He is currently using hypertonic saline nebs daily.  He is not using his flutter valve as regularly as he was instructed.  We will discuss this today.  He is currently trialing Judithann Sauger he is unsure if this has been an improvement from his Symbicort 160 and Spiriva Respimat 2.5.  He would like additional samples to see if it continues to maintain his breathing well after finishing steroids and antibiotics.  He is also interested in finishing up his Symbicort 160 and Spiriva Respimat  2.5 that he has on hand.  We will discuss this today.  Patient had a recent palliative care meeting.  He reports that it was a good visit.  He is unsure if he wants to follow-up with them long-term though.  We will discuss this today.  Of note patient has a previous reaction to Levaquin causing hallucinations.  Observations/Objective:  04/15/2019 - EKG - QTC - 424  04/07/2018-CTA- nonocclusive PE centrally involving branches of the right middle lobe and right lower lobe, no evidence of right heart strain  11/04/2017-echocardiogram-LV ejection fraction of 65 to 07%, grade 1 diastolic dysfunction  3/71/0626-RSWNIOEVO function test- FVC 4.59 (92% predicted), postbronchodilator ratio 29, postbronchodilator FEV1 1.41 (40% predicted), DLCO 72  11/27/2018-chest x-ray- no active cardiopulmonary disease, COPD  04/15/2019-chest x-ray-pulmonary hyperinflation emphysema without acute abnormality of the lungs  07/07/2019-chest x-ray-no radiographic evidence of acute cardiopulmonary disease, chronic peribronchial cuffing and emphysematous changes compatible with COPD, extensive scarring in the apices of the lungs bilaterally, right greater than left, aortic arthrosclerosis  11/17/2019-Resp Sputum - Pseudomonas Aeruginosa  >>> Pansensitive  Social History   Tobacco Use  Smoking Status Former Smoker  . Packs/day: 2.00  . Years: 52.00  . Pack years: 104.00  . Types: Pipe, Cigarettes  . Start date: 10/06/1965  . Quit date: 10/06/2017  . Years since quitting: 2.1  Smokeless Tobacco Never Used  Tobacco Comment   04/07/2018 "smoked cigarettes years ago; stopped ~ 20 yr ago; stopped smoking pipe in 09/2017"   Immunization History  Administered Date(s) Administered  . Fluad Quad(high Dose 65+) 03/16/2019  . Influenza Split 04/24/2012  .  Influenza Whole 05/08/2009, 04/08/2011  . Influenza, High Dose Seasonal PF 05/01/2017, 03/31/2018  . Influenza,inj,Quad PF,6+ Mos 04/27/2013, 04/15/2014, 04/24/2015  .  Influenza,inj,quad, With Preservative 05/01/2018  . Influenza-Unspecified 05/13/2016  . PFIZER SARS-COV-2 Vaccination 07/30/2019, 08/20/2019  . Pneumococcal Conjugate-13 02/25/2014  . Pneumococcal Polysaccharide-23 08/15/2011, 12/19/2016  . Pneumococcal-Unspecified 03/08/2017  . Td 02/21/2010  . Tdap 11/05/2017  . Zoster 01/29/2012    Assessment and Plan:  Discussion: We will go ahead and start patient on antibiotic therapy for pansensitive Pseudomonas culture.  Patient to hold Monday Wednesday Friday azithromycin at this time.  We will have planned follow-up with patient next week after completing CT chest.  Patient reports a good appointment with palliative care but he does not feel that he is ready to continue to work with them long-term.  We have coordinated an updated palliative care with patient's request.  Patient knows that he can resume palliative care therapy just by letting us know we can coordinate this.   Stage 4 very severe COPD by GOLD classification ([place]) Plan: Cipro today  >>>monitor for clinical worsening  Restart flutter valve BID Restart hypertonic saline BID  Continue Symbicort 160 Continue Spiriva Respimat 2.5 Continue 10 mg of daily prednisone Continue Daliresp Hold  Monday Wednesday Friday azithromycin Will stop pallative care moving forward    Chronic respiratory failure with hypoxia ([place]) Plan: Continue oxygen therapy as prescribed 4 L with exertion  COPD with acute exacerbation (HCC) Recently positive respiratory sputum cultures for Pseudomonas, pansensitive History of hallucinations with Levaquin Patient is never taken ciprofloxacin  Plan: Patient is okay with starting ciprofloxacin Patient to monitor for symptoms of hallucinations or lower extremity numbness, neuropathy or tendon pain Patient knows to stop ciprofloxacin if any of the symptoms occur  DNR (do not resuscitate) Patient is DNR  Follow Up Instructions:  Return in about 1 week  (around 11/26/2019), or if symptoms worsen or fail to improve, for Follow up with [name] FNP-C, After Chest CT.   I discussed the assessment and treatment plan with the patient. The patient was provided an opportunity to ask questions and all were answered. The patient agreed with the plan and demonstrated an understanding of the instructions.   The patient was advised to call back or seek an in-person evaluation if the symptoms worsen or if the condition fails to improve as anticipated.  I provided 45 minutes of non-face-to-face time during this encounter.   Lauraine Rinne, NP

## 2019-11-19 ENCOUNTER — Ambulatory Visit (INDEPENDENT_AMBULATORY_CARE_PROVIDER_SITE_OTHER): Payer: Medicare Other | Admitting: Pulmonary Disease

## 2019-11-19 ENCOUNTER — Other Ambulatory Visit: Payer: Self-pay

## 2019-11-19 ENCOUNTER — Encounter: Payer: Self-pay | Admitting: Pulmonary Disease

## 2019-11-19 ENCOUNTER — Telehealth: Payer: Self-pay

## 2019-11-19 DIAGNOSIS — J449 Chronic obstructive pulmonary disease, unspecified: Secondary | ICD-10-CM

## 2019-11-19 DIAGNOSIS — J441 Chronic obstructive pulmonary disease with (acute) exacerbation: Secondary | ICD-10-CM

## 2019-11-19 DIAGNOSIS — J9611 Chronic respiratory failure with hypoxia: Secondary | ICD-10-CM

## 2019-11-19 DIAGNOSIS — Z66 Do not resuscitate: Secondary | ICD-10-CM

## 2019-11-19 LAB — RESPIRATORY CULTURE OR RESPIRATORY AND SPUTUM CULTURE
MICRO NUMBER:: 10458561
SPECIMEN QUALITY:: ADEQUATE

## 2019-11-19 MED ORDER — BREZTRI AEROSPHERE 160-9-4.8 MCG/ACT IN AERO: 2.0000 | INHALATION_SPRAY | Freq: Two times a day (BID) | RESPIRATORY_TRACT | 0 refills | Status: DC

## 2019-11-19 MED ORDER — CIPROFLOXACIN HCL 500 MG PO TABS: 500.0000 mg | ORAL_TABLET | Freq: Two times a day (BID) | ORAL | 0 refills | Status: DC

## 2019-11-19 NOTE — Assessment & Plan Note (Signed)
Plan: Cipro today  >>>monitor for clinical worsening  Restart flutter valve BID Restart hypertonic saline BID  Continue Symbicort 160 Continue Spiriva Respimat 2.5 Continue 10 mg of daily prednisone Continue Daliresp Hold  Monday Wednesday Friday azithromycin Will stop pallative care moving forward

## 2019-11-19 NOTE — Progress Notes (Signed)
Pseudomonai in sputum.  Tele visi with APP if not fixed one already

## 2019-11-19 NOTE — Assessment & Plan Note (Signed)
Patient is DNR

## 2019-11-19 NOTE — Assessment & Plan Note (Signed)
Recently positive respiratory sputum cultures for Pseudomonas, pansensitive History of hallucinations with Levaquin Patient is never taken ciprofloxacin  Plan: Patient is okay with starting ciprofloxacin Patient to monitor for symptoms of hallucinations or lower extremity numbness, neuropathy or tendon pain Patient knows to stop ciprofloxacin if any of the symptoms occur

## 2019-11-19 NOTE — Patient Instructions (Addendum)
You were seen today by Lauraine Rinne, NP  for:   1. Stage 4 very severe COPD by GOLD classification ([place])  Breztri >>> 2 puffs in the morning right when you wake up, rinse out your mouth after use, 12 hours later 2 puffs, rinse after use >>> Take this daily, no matter what >>> This is not a rescue inhaler   Note your daily symptoms > remember "red flags" for COPD:   >>>Increase in cough >>>increase in sputum production >>>increase in shortness of breath or activity  intolerance.   If you notice these symptoms, please call the office to be seen.    2. COPD with acute exacerbation (HCC)  - ciprofloxacin (CIPRO) 500 MG tablet; Take 1 tablet (500 mg total) by mouth 2 (two) times daily.  Dispense: 20 tablet; Refill: 0  Continue hypertonic saline nebs twice daily  Continue flutter valve use twice daily  Please stop antibiotic use if you start to have worsening hallucinations  3. Chronic respiratory failure with hypoxia (HCC)  Continue oxygen therapy as prescribed  >>>maintain oxygen saturations greater than 88 percent  >>>if unable to maintain oxygen saturations please contact the office  >>>do not smoke with oxygen  >>>can use nasal saline gel or nasal saline rinses to moisturize nose if oxygen causes dryness     We recommend today:   Meds ordered this encounter  Medications  . Budeson-Glycopyrrol-Formoterol (BREZTRI AEROSPHERE) 160-9-4.8 MCG/ACT AERO    Sig: Inhale 2 puffs into the lungs 2 (two) times daily.    Dispense:  17.7 g    Refill:  0    Order Specific Question:   Lot Number?    Answer:   [id number]    Order Specific Question:   Expiration Date?    Answer:   04/06/2021    Order Specific Question:   Manufacturer?    Answer:   [employer] [[id number]]  . ciprofloxacin (CIPRO) 500 MG tablet    Sig: Take 1 tablet (500 mg total) by mouth 2 (two) times daily.    Dispense:  20 tablet    Refill:  0    Follow Up:    Return in about 1 week (around 11/26/2019), or if  symptoms worsen or fail to improve, for Follow up with [name] FNP-C, After Chest CT.   Please do your part to reduce the spread of COVID-19:      Reduce your risk of any infection  and COVID19 by using the similar precautions used for avoiding the common cold or flu:  [name] Kitchen Wash your hands often with soap and warm water for at least 20 seconds.  If soap and water are not readily available, use an alcohol-based hand sanitizer with at least 60% alcohol.  . If coughing or sneezing, cover your mouth and nose by coughing or sneezing into the elbow areas of your shirt or coat, into a tissue or into your sleeve (not your hands). Langley Gauss A MASK when in public  . Avoid shaking hands with others and consider head nods or verbal greetings only. . Avoid touching your eyes, nose, or mouth with unwashed hands.  . Avoid close contact with people who are sick. . Avoid places or events with large numbers of people in one location, like concerts or sporting events. . If you have some symptoms but not all symptoms, continue to monitor at home and seek medical attention if your symptoms worsen. . If you are having a medical emergency, call 911.   ADDITIONAL  [place] / e-Visit: [email or web address]         [facility]: [facility]: [phone]                   [facility]: [phone]     It is flu season:   >>> Best ways to protect herself from the flu: Receive the yearly flu vaccine, practice good hand hygiene washing with soap and also using hand sanitizer when available, eat a nutritious meals, get adequate rest, hydrate appropriately   Please contact the office if your symptoms worsen or you have concerns that you are not improving.   Thank you for choosing [facility] for your healthcare, and for allowing [place] to partner with you on your  healthcare journey. I am thankful to be able to provide care to you today.   [name] FNP-C

## 2019-11-19 NOTE — Assessment & Plan Note (Signed)
Plan: Continue oxygen therapy as prescribed 4 L with exertion

## 2019-11-19 NOTE — Telephone Encounter (Signed)
Received call back from patient, RN discussed with patient to confirm about follow up appt that is scheduled for June with NP that visited this week.  Pt advised that he would like to cancel palliative services as he does not think that he needs at this time. Patient has spoken with NP at his doctor office and notified him of same.  Encouraged patient to reach out to MD office if he changes his mind, verbalized understanding.  RN also spoke with [name], NP at Pulmonology office to relay the same conversation

## 2019-11-22 NOTE — Telephone Encounter (Signed)
Good Afternoon [name] Patient Francisco [name] DOB [birth year] sent this message in [place] . Can you please advise. Can not tolerate Ciprofloxacin HCL 500 MG.  Makes me sick to stomach and very weak/tired feeling.very fatigued. Have taken 3 pills but am not taking anymore.  If you need to speak to me call [phone].

## 2019-11-24 NOTE — Telephone Encounter (Signed)
Discussed with [name] who saw the patient.  Patient has multiple allergies including to levofloxacin.  Because he has Pseudomonas and is allergic to Levaquin and not able to tolerate ciprofloxacin.  He might need IV therapy such as cefepime  Plan --Might need 12 days of IV cefepime therapy through home care -please ask one of your colleagues out to make a home care referral and to set up IV antibiotic therapy through outpatient  -If this is complicated or confusing might have to send him to infectious disease clinic   Allergies  Allergen Reactions  . Tape Other (See Comments)    SKIN IS VERY THIN; CAN ONLY USE COBAN WRAPS DUE TO CONDITION OF SKIN!!  . Levaquin [Levofloxacin]     hallucinations

## 2019-11-25 ENCOUNTER — Other Ambulatory Visit: Payer: Self-pay

## 2019-11-25 ENCOUNTER — Ambulatory Visit (INDEPENDENT_AMBULATORY_CARE_PROVIDER_SITE_OTHER)
Admission: RE | Admit: 2019-11-25 | Discharge: 2019-11-25 | Disposition: A | Discharge status: Home / Self Care | Payer: Medicare Other | Source: Ambulatory Visit | Attending: Internal Medicine | Admitting: Internal Medicine

## 2019-11-25 DIAGNOSIS — J449 Chronic obstructive pulmonary disease, unspecified: Secondary | ICD-10-CM

## 2019-11-25 DIAGNOSIS — J441 Chronic obstructive pulmonary disease with (acute) exacerbation: Secondary | ICD-10-CM

## 2019-11-25 IMAGING — CT CT CHEST W/O CM
2 of 4 series · 15 of 36 positions shown, 18 images · non-contrast
Comparison: [DATE].

CLINICAL DATA: COPD exacerbation. Severe shortness of breath.

EXAM:
CT CHEST WITHOUT CONTRAST
TECHNIQUE: Multidetector CT imaging of the chest was performed following the
standard protocol without IV contrast.

[Series 2: thorax · axial · 0.75mm/px · z∈[-322,-62]mm · 12 of 154 slices shown, 15 images]
[im 12/154  mediastinal]
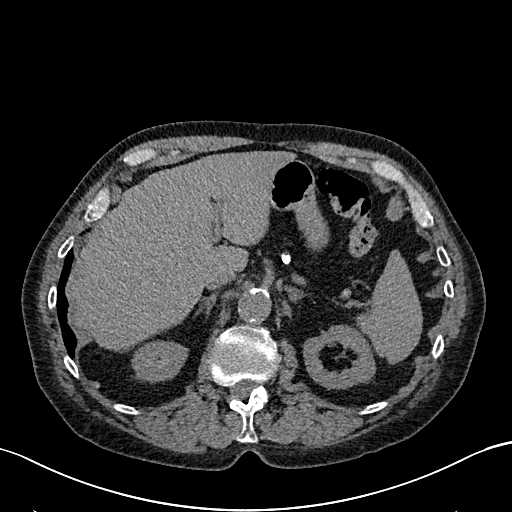
[im 12/154  lung]
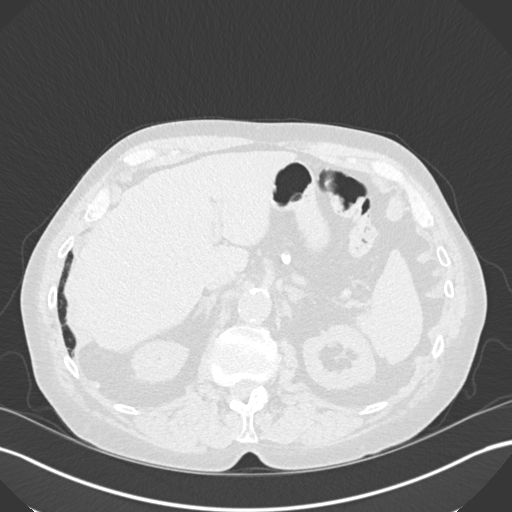
[im 24/154  lung]
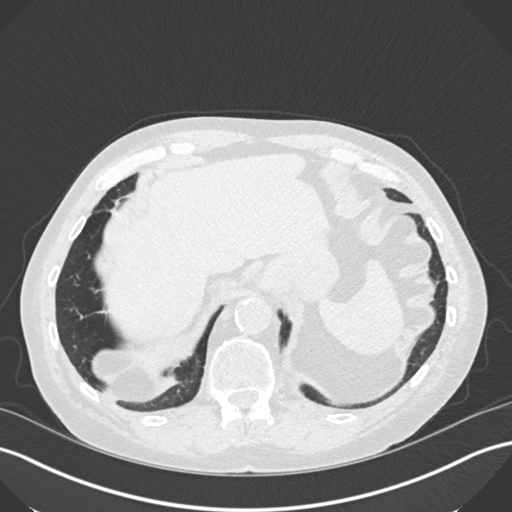
[im 36/154  lung]
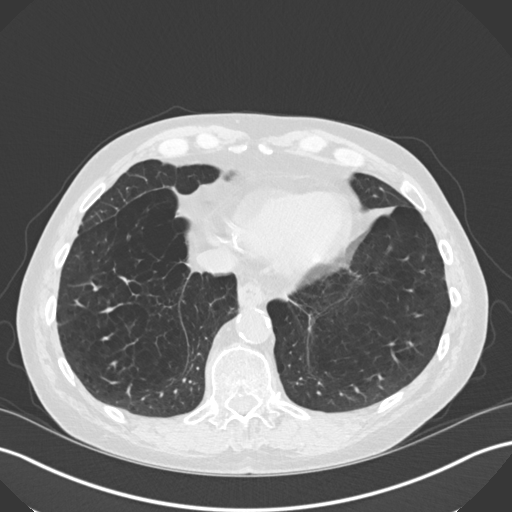
[im 48/154  lung]
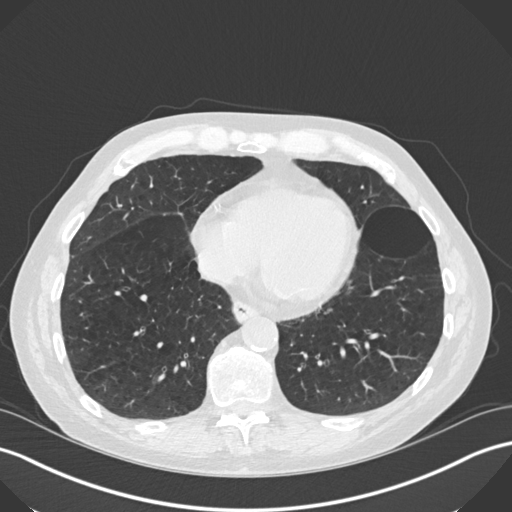
[im 59/154  mediastinal]
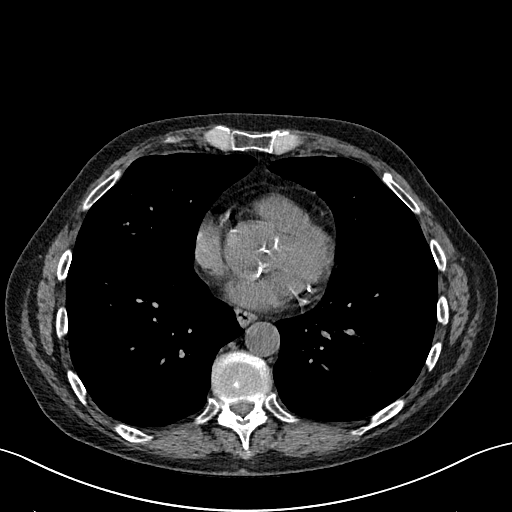
[im 59/154  lung]
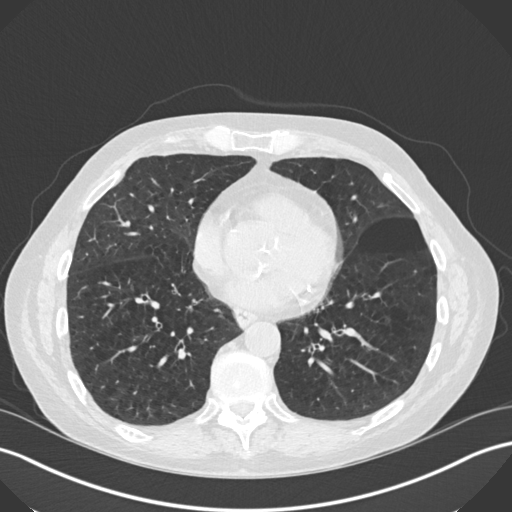
[im 71/154  lung]
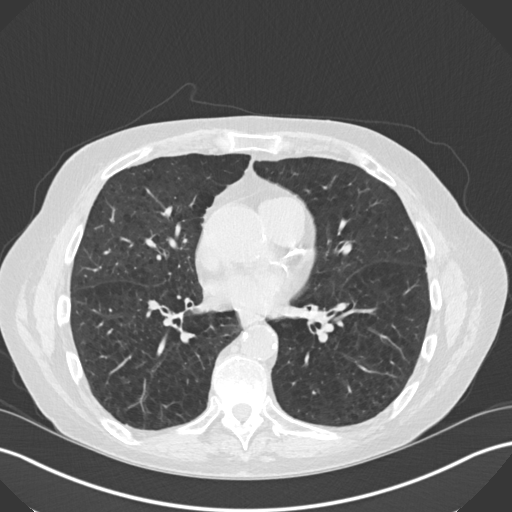
[im 83/154  lung]
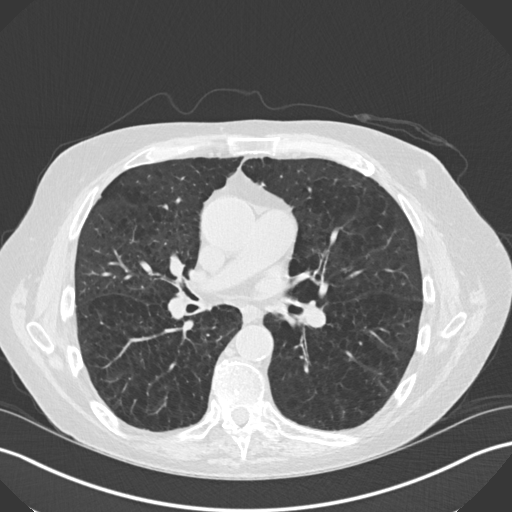
[im 95/154  lung]
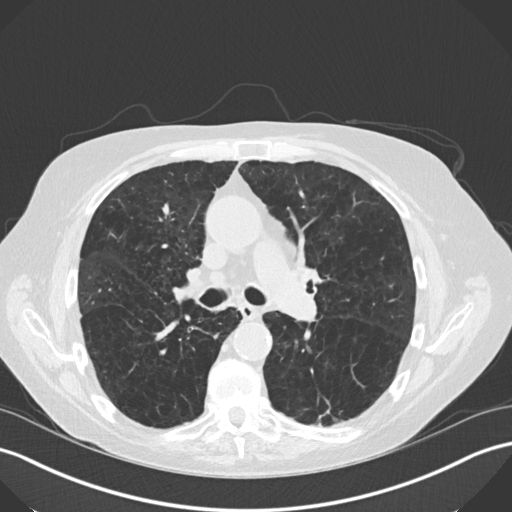
[im 106/154  mediastinal]
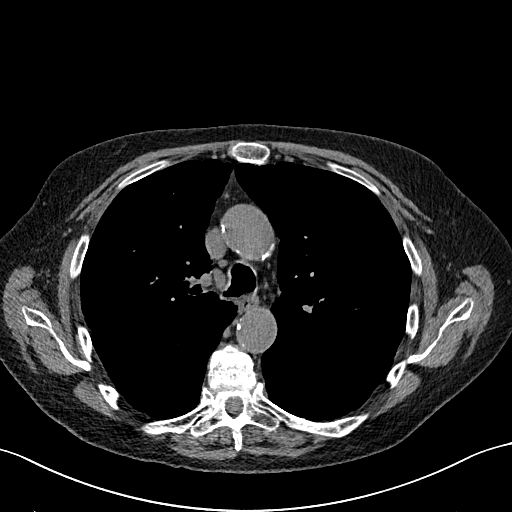
[im 106/154  lung]
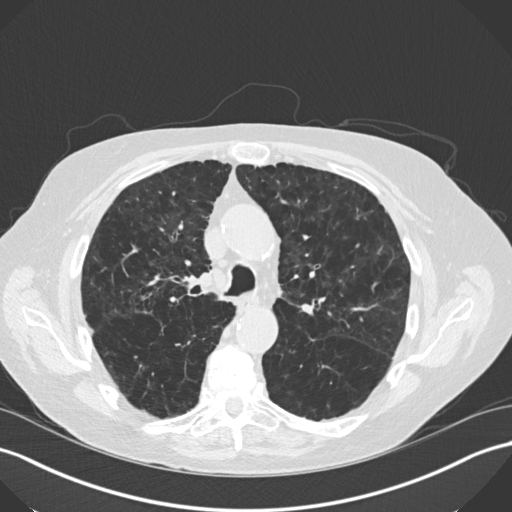
[im 118/154  lung]
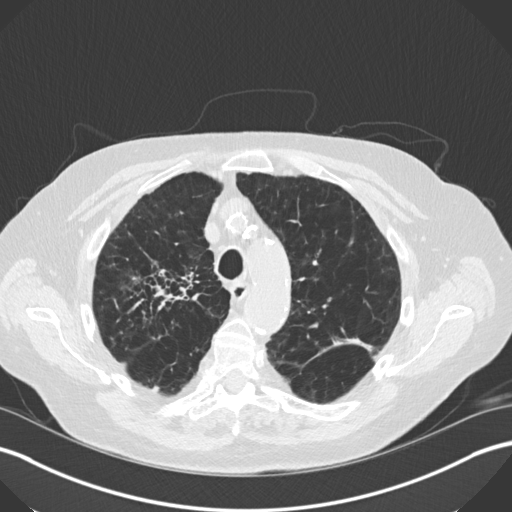
[im 130/154  lung]
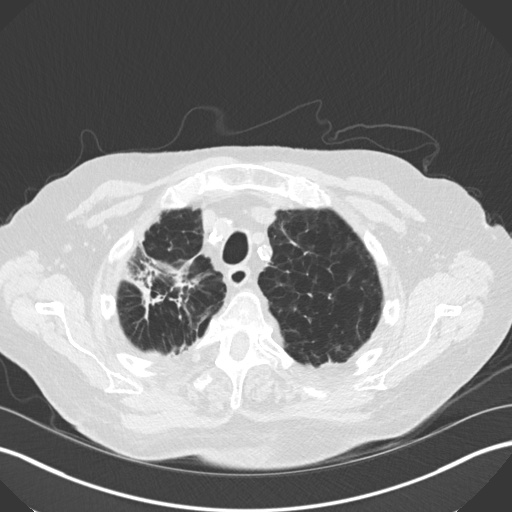
[im 142/154  lung]
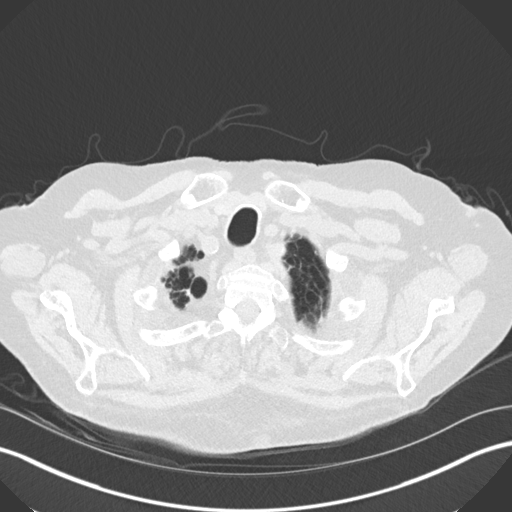

[Series 5: coronal · coronal · 0.60mm/px · 3 of 130 slices shown]
[im 26/130  lung]
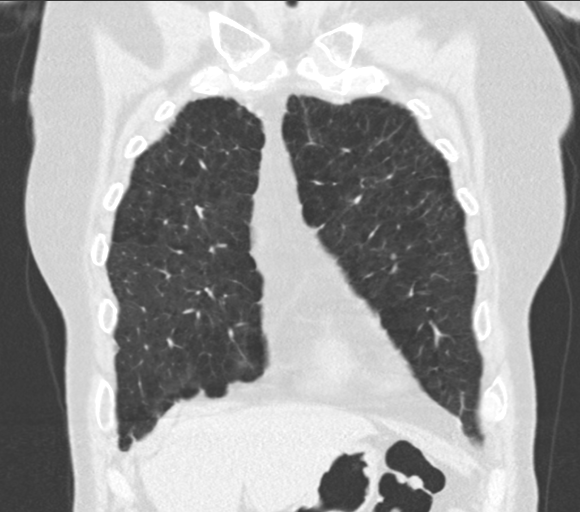
[im 52/130  lung]
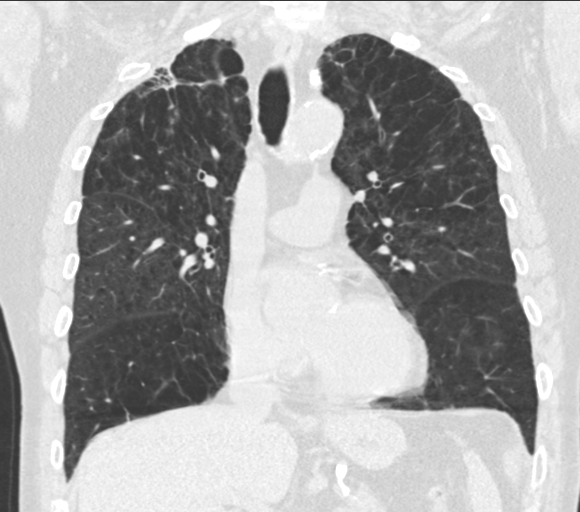
[im 78/130  lung]
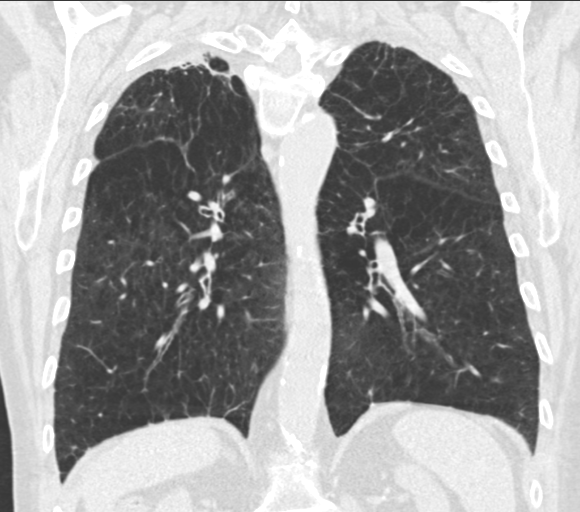

[15 of 36 positions shown; findings below may reference images not displayed]

FINDINGS: Cardiovascular: The heart size is normal. There is no pericardial
effusion. Aortic atherosclerosis. Three vessel coronary artery
atherosclerotic calcifications identified

Mediastinum/Nodes: No enlarged mediastinal or axillary lymph nodes.
Thyroid gland, trachea, and esophagus demonstrate no significant
findings.

Lungs/Pleura: No pleural effusion identified. Advanced changes of
centrilobular and paraseptal emphysema with mild bullous changes.
Progressive confluent fibrotic with extensive architectural
distortion and volume loss no acute airspace consolidation,
atelectasis or pneumothorax. Scattered, bilateral areas of
pleuroparenchymal scarring identified changes within the apical
portions of the right upper lobe are again noted.

Upper Abdomen: Bilateral low-attenuation adrenal nodules appears
similar to previous exam and are compatible with benign adenomas. No
acute abnormality identified within the imaged portions of the upper
abdomen.

Musculoskeletal: Mild scoliosis and degenerative disc disease noted
within the thoracic spine. Stable chronic compression deformities at
T6, T7 and T8. No new compression fractures.
IMPRESSION: 1. No acute cardiopulmonary abnormalities.
2. Progressive confluent fibrotic changes within the right upper
lobe with extensive architectural distortion and volume loss. Likely
the sequelae of prior inflammation/infection.
3. Aortic atherosclerosis, in addition to 3 vessel coronary artery
disease. Please note that although the presence of coronary artery
calcium documents the presence of coronary artery disease, the
severity of this disease and any potential stenosis cannot be
assessed on this non-gated CT examination. Assessment for potential
risk factor modification, dietary therapy or pharmacologic therapy
may be warranted, if clinically indicated.
4. Bilateral adrenal adenomas.
5. Stable chronic compression deformities at T6, T7 and T8.
6. Diffuse bronchial wall thickening with emphysema, as above;
imaging findings suggestive of underlying COPD.

Aortic Atherosclerosis ([NZ]-[NZ]) and Emphysema ([NZ]-[NZ]).

## 2019-11-25 NOTE — Patient Instructions (Addendum)
Please stop by lab before you go If you have mychart- we will send your results within 3 business days of [place] receiving them.  If you do not have mychart- we will call you about results within 5 business days of [place] receiving them.   No changes today  Hoping the antibiotics help you feel better

## 2019-11-25 NOTE — Progress Notes (Deleted)
Virtual Visit via Telephone Note  I connected with Francisco [name] on 11/25/19 at  9:30 AM EDT by telephone and verified that I am speaking with the correct person using two identifiers.  Location: Patient: Home Provider: [facility] - [address], [place], [place], [place] [address]   I discussed the limitations, risks, security and privacy concerns of performing an evaluation and management service by telephone and the availability of in person appointments. I also discussed with the patient that there may be a patient responsible charge related to this service. The patient expressed understanding and agreed to proceed.  Patient consented to consult via telephone: Yes People present and their role in pt care: Pt     History of Present Illness:  74 year old male former smoker followed in our office for COPD  PMH: Hypertension, osteoporosis, chronic PE Smoker/ Smoking History: Former smoker.  Quit smoking in 2019.  104-pack-year smoking history. Maintenance: Symbicort 160, Spiriva Respimat 2.5, 10 mg prednisone daily, daliresp, Azithro MWF  Pt of: [name]  Chief complaint:     Observations/Objective:  Social History   Tobacco Use  Smoking Status Former Smoker  . Packs/day: 2.00  . Years: 52.00  . Pack years: 104.00  . Types: Pipe, Cigarettes  . Start date: 10/06/1965  . Quit date: 10/06/2017  . Years since quitting: 2.1  Smokeless Tobacco Never Used  Tobacco Comment   04/07/2018 "smoked cigarettes years ago; stopped ~ 20 yr ago; stopped smoking pipe in 09/2017"   Immunization History  Administered Date(s) Administered  . Fluad Quad(high Dose 65+) 03/16/2019  . Influenza Split 04/24/2012  . Influenza Whole 05/08/2009, 04/08/2011  . Influenza, High Dose Seasonal PF 05/01/2017, 03/31/2018  . Influenza,inj,Quad PF,6+ Mos 04/27/2013, 04/15/2014, 04/24/2015  . Influenza,inj,quad, With Preservative 05/01/2018  . Influenza-Unspecified 05/13/2016  .  PFIZER SARS-COV-2 Vaccination 07/30/2019, 08/20/2019  . Pneumococcal Conjugate-13 02/25/2014  . Pneumococcal Polysaccharide-23 08/15/2011, 12/19/2016  . Pneumococcal-Unspecified 03/08/2017  . Td 02/21/2010  . Tdap 11/05/2017  . Zoster 01/29/2012      Assessment and Plan:  No problem-specific Assessment & Plan notes found for this encounter.   Follow Up Instructions:  No follow-ups on file.   I discussed the assessment and treatment plan with the patient. The patient was provided an opportunity to ask questions and all were answered. The patient agreed with the plan and demonstrated an understanding of the instructions.   The patient was advised to call back or seek an in-person evaluation if the symptoms worsen or if the condition fails to improve as anticipated.  I provided *** minutes of non-face-to-face time during this encounter.   Lauraine Rinne, NP

## 2019-11-25 NOTE — Progress Notes (Signed)
Phone [phone] In person visit   Subjective:   Francisco [name] is a 74 y.o. year old very pleasant male patient who presents for/with See problem oriented charting Chief Complaint  Patient presents with  . Hyperlipidemia   This visit occurred during the SARS-CoV-2 public health emergency.  Safety protocols were in place, including screening questions prior to the visit, additional usage of staff PPE, and extensive cleaning of exam room while observing appropriate contact time as indicated for disinfecting solutions.   Past Medical History-  Patient Active Problem List   Diagnosis Date Noted  . DNR (do not resuscitate) 04/21/2018    Priority: High  . Chronic pulmonary embolism ([place]) 04/07/2018    Priority: High  . Osteoporosis 11/19/2017    Priority: High  . Thoracic compression fracture ([place]) 11/02/2017    Priority: High  . Former smoker 08/23/2008    Priority: High  . COPD (chronic obstructive pulmonary disease) with emphysema ([place]) 08/23/2008    Priority: High  . Essential tremor 03/31/2018    Priority: Medium  . BPPV (benign paroxysmal positional vertigo), right 02/10/2018    Priority: Medium  . Essential hypertension 11/02/2017    Priority: Medium  . BPH associated with nocturia 06/25/2017    Priority: Medium  . Hyperglycemia 06/12/2016    Priority: Medium  . Hyperlipidemia 07/22/2014    Priority: Medium  . Venous stasis dermatitis of both lower extremities 11/02/2017    Priority: Low  . Chronic respiratory failure with hypoxia ([place]) 09/23/2017    Priority: Low  . Leukocytosis 12/19/2016    Priority: Low  . Onychomycosis 07/22/2014    Priority: Low  . Left ankle swelling 07/22/2014    Priority: Low  . History of skin cancer 05/17/2014    Priority: Low  . Chronic rhinitis 10/28/2012    Priority: Low  . Pulmonary nodule 09/23/2011    Priority: Low  . History of colonic polyps 08/23/2008    Priority: Low  . PAD (peripheral artery disease) ([place])  12/11/2018  . Pre-operative respiratory examination 12/10/2018  . COPD with acute exacerbation ([place]) 11/27/2018  . Stage 4 very severe COPD by GOLD classification ([place]) 11/27/2018  . Diastolic dysfunction 81/07/7508  . Syncope 11/02/2017    Medications- reviewed and updated Current Outpatient Medications  Medication Sig Dispense Refill  . acetaminophen (TYLENOL) 500 MG tablet Take 1,000 mg by mouth 2 (two) times daily.     [name] Kitchen albuterol (VENTOLIN HFA) 108 (90 Base) MCG/ACT inhaler USE 2 PUFFS EVERY 6 HOURS  AS NEEDED FOR WHEEZING 18 g 3  . azithromycin (ZITHROMAX) 250 MG tablet Take 1 tablet (250 mg total) by mouth every Monday, Wednesday, and Friday. 45 tablet 1  . Budeson-Glycopyrrol-Formoterol (BREZTRI AEROSPHERE) 160-9-4.8 MCG/ACT AERO Inhale 2 puffs into the lungs in the morning and at bedtime. 5.9 g 0  . Budeson-Glycopyrrol-Formoterol (BREZTRI AEROSPHERE) 160-9-4.8 MCG/ACT AERO Inhale 2 puffs into the lungs 2 (two) times daily. 17.7 g 0  . budesonide-formoterol (SYMBICORT) 160-4.5 MCG/ACT inhaler Inhale 2 puffs into the lungs 2 (two) times daily. 30 g 3  . Calcium Carbonate-Vitamin D (CALCIUM 600+D PO) Take 2 tablets by mouth daily.    . cycloSPORINE (RESTASIS) 0.05 % ophthalmic emulsion 1 drop 2 (two) times daily.    . fluticasone (FLONASE) 50 MCG/ACT nasal spray Place 2 sprays into both nostrils daily. 48 g 3  . furosemide (LASIX) 20 MG tablet TAKE 1 TABLET BY MOUTH  DAILY 90 tablet 3  . OXYGEN Inhale 3 L into  the lungs continuous.     . predniSONE (DELTASONE) 10 MG tablet Take 1 tablet (10 mg total) by mouth daily with breakfast. 90 tablet 0  . Respiratory Therapy Supplies (FLUTTER) DEVI 10 times Twice a day and prn as needed, may increase if feeling worse 1 each 0  . rivaroxaban (XARELTO) 20 MG TABS tablet TAKE 1 TABLET BY MOUTH  DAILY 90 tablet 3  . roflumilast (DALIRESP) 500 MCG TABS tablet Take 1 tablet (500 mcg total) by mouth daily. 90 tablet 2  . SPIRIVA RESPIMAT 2.5 MCG/ACT  AERS USE 2 INHALATIONS BY MOUTH  ONCE DAILY (Patient taking differently: Inhale 2 puffs into the lungs daily. ) 12 g 3  . traMADol (ULTRAM) 50 MG tablet Take 1 tablet (50 mg total) by mouth every 12 (twelve) hours as needed for moderate pain or severe pain. 30 tablet 1  . sodium chloride HYPERTONIC 3 % nebulizer solution Take by nebulization daily. 90 mL 12   No current facility-administered medications for this visit.     Objective:  BP 124/68   Pulse (!) 106   Temp 98.7 F (37.1 C) (Temporal)   Ht 6' (1.829 m)   Wt 170 lb (77.1 kg)   SpO2 100%   BMI 23.06 kg/m  Gen: NAD, resting comfortably CV: RRR no murmurs rubs or gallops Lungs: CTAB no crackles, wheeze, rhonchi Abdomen: soft/nontender/nondistended/normal bowel sounds. No rebound or guarding.  Ext: 2+ edema left greater than right Skin: warm, dry    Assessment and Plan   % Emphysema/COPD-patient with chronic respiratory failure with hypoxia.  Remains on home oxygen-wearing oxygen today.  Limited ability to ambulate as a result.  On medications as above with pulmonology.  At present they are setting him up with 2 weeks IV antibiotics for ongoing coughing issues/poor control of COPD.  #hypertension/edema S: medication: Lasix every other day.  I thought patient was unable to use compression stockings due to PAD but Dr. Eden Lathe on March 11 visit actually encouraged compression stockings- they have not been able to get these on - tried compression wraps without relief -edema has been stable lately BP Readings from Last 3 Encounters:  11/29/19 124/68  11/11/19 (!) 142/70  09/17/19 118/72  A/P: Blood pressure is well controlled with Lasix every other day.  Edema is stable.  Not able to get compression stockings on-continue current medication  #hyperlipidemia-LDL goal under 70 with PAD. CAD also noted on CT 11/25/2019 as well as aortic atherosclerosis S: Medication:none Lab Results  Component Value Date   CHOL 251 (H) 06/02/2019    HDL 141.90 06/02/2019   LDLCALC 99 06/02/2019   LDLDIRECT 75.0 12/06/2016   TRIG 49.0 06/02/2019   CHOLHDL 2 06/02/2019   A/P: Patient is well aware of aortic atherosclerosis as well as plaque on coronary arteries and hyperlipidemia-for him he believes that benefits of statin are outweighed by the risks-in addition he is well aware of his own potential mortality and honestly does not mind it heart attack is the way he passes from this earth.   #Elevated blood sugar-A1c was normal despite prior CBGs being slightly high.  #Hematuria-urine microscopic planned- he dropped off urine this morning  #Low back pain.  Cannot use NSAIDs due to Xarelto.  Primarily uses tramadol for back pain on sparing basis-refill was provided today. #60 lasted 6 months  #Chronic PE-patient remains on chronic Xarelto.  Continue current medication  #Osteoporosis-vitamin D was normal on last check. -Bone density 2019 -He has been taken off of  Fosamax due to possible GI side effects in relation to [place] has been to consider reclass.  We discussed this as well as potentially repeating bone density-with all he has going on with needing IV antibiotics wanted to hold off for now-can rediscuss in 6 months -Avoiding falls is that they think!  #Peripheral arterial disease-follows with vascular surgery.  History of right common femoral endarterectomy bovine patch angioplasty.  Prior ulceration right third toe-he reports this is doing well and seeing [name] every 6 months. Patient declines statin.  Not on aspirin as on xarelto.   # essential tremor- doesn't want more meds plus would need to avoid beta blocker due to pulmonary issues  Recommended follow up: Return in about 6 months (around 05/31/2020) for physical or sooner if needed. Future Appointments  Date Time Provider [place]  12/16/2019  9:30 AM Lauraine Rinne, NP [facility] None  06/06/2020 10:40 AM Yong Channel [name], MD [facility]    Lab/Order associations:   ICD-10-CM   1. Essential hypertension  I10 CBC with Differential/Platelet    Comprehensive metabolic panel  2. Hyperlipidemia, unspecified hyperlipidemia type  E78.5   3. Other emphysema ([place])  J43.8   4. Other osteoporosis without current pathological fracture  M81.8   5. Hyperglycemia  R73.9   6. Hematuria, unspecified type  R31.9 Urine Microscopic Only    Meds ordered this encounter  Medications  . fluticasone (FLONASE) 50 MCG/ACT nasal spray    Sig: Place 2 sprays into both nostrils daily.    Dispense:  48 g    Refill:  3  . furosemide (LASIX) 20 MG tablet    Sig: TAKE 1 TABLET BY MOUTH  DAILY    Dispense:  90 tablet    Refill:  3    Requesting 1 year supply  . traMADol (ULTRAM) 50 MG tablet    Sig: Take 1 tablet (50 mg total) by mouth every 12 (twelve) hours as needed for moderate pain or severe pain.    Dispense:  30 tablet    Refill:  1   Return precautions advised.  Garret Reddish, MD

## 2019-11-26 ENCOUNTER — Ambulatory Visit (INDEPENDENT_AMBULATORY_CARE_PROVIDER_SITE_OTHER): Payer: Medicare Other | Admitting: Internal Medicine

## 2019-11-26 ENCOUNTER — Ambulatory Visit: Payer: Medicare Other | Admitting: Pulmonary Disease

## 2019-11-26 DIAGNOSIS — J449 Chronic obstructive pulmonary disease, unspecified: Secondary | ICD-10-CM | POA: Diagnosis not present

## 2019-11-26 DIAGNOSIS — J9611 Chronic respiratory failure with hypoxia: Secondary | ICD-10-CM

## 2019-11-26 DIAGNOSIS — J151 Pneumonia due to Pseudomonas: Secondary | ICD-10-CM

## 2019-11-26 NOTE — Addendum Note (Signed)
Addended by: Lia Foyer R on: 11/26/2019 02:06 PM   Modules accepted: Orders

## 2019-11-26 NOTE — Progress Notes (Signed)
Pt. Saw [name] in the office today, 11/26/19 to discuss the CT.

## 2019-11-26 NOTE — Progress Notes (Signed)
ROV 12/30/16 -- patient has a history of tobacco use, COPD currently maintained on chronic prednisone, Symbicort, Spiriva. Most recent blurry function testing was March 2011 with an FEV1 of 1.2 L (34% Pred), severe obstruction. He up titrates his prednisone on his own based on symptoms. No abx since last time. He has increased pred temporrily about 4 -5 x since last time. No increases for over a month. He is smoking a pipe, not cigarettes. He is using HCTZ more frequently these days. He is on flonase and singulair. He does note that he has some slow progression of his DOE. He coughs a few times a day, clears clear sputum.   rov 07/17/17 --this is a follow-up visit for patient with active tobacco use, severe COPD and chronic prednisone use.  He often titrates his prednisone on his own depending on how he is feeling. He complains of a new pain in his back below his left shoulder blade, has been present for the last 10 days. sometimes pleuritic, can be stabbing, worse w cough. His pred use: on 10mg  for the last 19 days. No hx VTE. He has LE edema, venous stasis changes, increased since his diuretics decreased.   ROV 09/23/17 --74 year old man with a history of tobacco use (still smokes pipe), severe COPD and severe obstruction on spirometry.  At his last visit he had some pleuritic left back pain and I performed a CT PA that I have reviewed from 07/17/17.  There was no pulmonary embolism but extensive emphysema present, some bilateral apical scar more so on the right, stable.  He uses prednisone 10 mg daily and uptitrate depending on his day-to-day symptoms.  He is otherwise managed on Symbicort and Spiriva.  Desaturated on arrival today after ambulating to the exam room. He reports more dyspnea, has increased pred intermittently.    OV 12/05/2017  Chief Complaint  Patient presents with  . Follow-up    Switching from RB to MR.   Pt is on 3L with exertion and 2L at rest. Pt states his breathing had  become worse but since he has been on O2 since 3/21, it has really helped with his breathing.  Pt was in hospital 4/28-5/3. Other than SOB, pt does have c/o cough, rattling in chest. Denies any CP/chest tightness.   History from patient, his wife and review of the old chart  Talmadge Coventry IIIs a transfer of care from [name] to myself [name].  His son is my patient and therefore he is done this transfer of care.  According to the patient he is to be seen by [name] for COPD.  Patient is FEV1 34% based on March 2011 PFTs according to chart review.  At baseline he is maintained on Spiriva, Symbicort, Singulair, chronic daily prednisone 10 mg/day for at least 3 years, and 3 times a week of azithromycin for 2 years.  Earlier this year in March 2019 he went on daytime oxygen and following a recent hospitalization April-May 2019.  For syncope that was believed due to nocturnal hypoxemia he was started on night oxygen.  Since starting oxygen his edema has improved and his overall quality of life is improved and he stopped having any orthostasis or presyncopal episodes.  He is grateful for the fact that he is on oxygen.  His current COPD CAT score and severity of symptoms is rated below and is 26.  Show significant amount of symptom burden.  His main goal is to  improve his quality of life.  He had his wife have many questions centering around quality of life, medication therapy.  Of note he has had some thoracic vertebral fractures that are new.  This happened after the fall in late April 2019.  Discovered at admission last month.  He feels is due to prednisone.  He is wondering about portable oxygen.   OV 01/29/2018  Chief Complaint  Patient presents with  . Follow-up    Pt states he has been doing okay since last visit. States breathing is about the same, has an occ cough but states the O2 has helped out a lot with breathing and cough. Denies any complaints of CP.   Lesia Sago II , 74 y.o. ,  with dob June 05, 1946 and male ,Not Hispanic or Latino from [place] [id number] - presents to lung clinic for advanced COPD follow-up. Since his last visit his symptoms course of improvement. Score is 26 and severe symptoms of document below. He is on Spiriva, Symbicort, continues oxygen, daily prednisone and azithromycin 3 times a week. He has stopped his singulair without any problems. Overall he says he is better. He is willing to give TRELEGY inhaler trial. Last visit to check blood gas and he does not have hypercapnia so he does not qualify for BiPAP        OV 04/03/2018  Subjective:  Patient ID: Marcelyn Bruins, male , DOB: [birth year] , age 74 y.o. , MRN: [id number] , ADDRESS: [place] [address]   04/03/2018 -   Chief Complaint  Patient presents with  . Acute Visit    Pt's O2 sats have been dropping into the 70s with little exertion.  Pt took O2 off just to shave 9/26 and after he finished shaving put the O2 back on, walked from the bathroom down to the living room on O2 and sats were at 77% on 3L 9/26. Pt also has c/o cough with white to yellow mucus and chest tightness with the SOB.     HPI QADIR FOLKS II 74 y.o. -acute visit for this patient.  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  He is chronically prednisone dependent.  He also is on schedule azithromycin.  He called in yesterday feeling unwell therefore we asked him to come in today.  He tells me that approximately a week ago he started having increased cough and congestion.  Yesterday primary care physician following a routine visit thought he was in COPD exacerbation and started him on doxycycline.  He also personally bumped up his baseline prednisone.  He does me that yesterday while he was shaving on room air he felt more short of breath than usual.  Also when he walked he desaturated more than usual therefore he decided to call in and come in today.   There is no fever or chills or hemoptysis or colored sputum.  No leg edema.  No orthopnea.  He does not feel like he needs to be in the emergency department or get admitted.   OV 05/11/2018  Subjective:  Patient ID: Marcelyn Bruins, male , DOB: [birth year] , age 67 y.o. , MRN: [id number] , ADDRESS: [place]  [id number]   05/11/2018 -   Chief Complaint  Patient presents with  . Follow-up    pt states breathing has wrosen since last OV. increased sob, occ chest tightness, prod cough with yellow  mucus & chest congestion. currently taking trelegy samples. recent admission 04/07/18 for PE.     HPI Lesia Sago II 74 y.o. -advanced COPD with chronic hypoxemic respiratory failure [not a BiPAP candidate because of lack of hypercapnia] presents with his wife for follow-up.  After the last visit he continued to have symptoms so on April 07, 2018 he got an outpatient CT scan and he had pulmonary embolism.  He got admitted to the hospital and then discharged and since then has been on Xarelto.  He continues his baseline 2-3 L of nasal cannula oxygen but he says since then he is more symptomatic.  Is also corresponds with him starting Trelegy inhaler and also for the last few to several weeks is having increased cough and congestion and yellow phlegm.  His COPD CAT score is deteriorated to 35 and above.  He is very frustrated by his symptoms.  Review of his medication shows that he takes azithromycin and chronic prednisone and Trelegy and oxygen for his COPD.  He is on Xarelto for his blood clots.  He is also on Fosamax for prednisone dependent osteoporosis management.  His smoking is in remission.  Wife says he is always sedentary.  Apparently they tried to do some physical therapy a month ago but he desaturated.  He does not seem to keen on physical therapy right now     OV 06/10/2018  Subjective:  Patient ID: Marcelyn Bruins, male , DOB: May 20, 1946 , age 43 y.o. , MRN: [id number] ,  ADDRESS: [place] [address]   06/10/2018 -   Chief Complaint  Patient presents with  . Follow-up    follows for COPD. patient has increased SOB with exertion     HPI Lesia Sago II 74 y.o. -returns for gold stage IV COPD follow-up with chronic hypoxemic respiratory failure.  I saw him approximately a month ago at which time recommended Trelegy.  However the Trelegy is not working well for him.  He feels Spiriva and Symbicort is of benefit for him.  Also recommended he start himself on Daliresp.  Given the GI side effect profile I communicated with his primary care physician about stopping Fosamax.  His primary care physician has advised Reclast infusion for him and is considering this but has not yet switched over.  This because the infusions will have to happen at [facility] long.  However he never started his Daliresp because he read the side effect profile and was worried about back pain.  He wanted me to go over the side effect profile of Daliresp all over again.  I printed up-to-date and went over all the side effects.  I did this with him and his wife.  There is a 3% incidence of backache.  The dominant feature is GI issues of weight loss and diarrhea and nausea and abdominal pain.  The second dominant feature is some anxiety issues.  After reading all this he has decided to start [place].  He is aware of the limitations as well.  He is aware that this is purely preventive in reducing COPD exacerbation frequency.  He understands that the burden on his health from frequent COPD exacerbations is high.  Currently COPD CAT score is back at baseline        OV 07/22/2018  Subjective:  Patient ID: Marcelyn Bruins, male , DOB: [birth year] , age 71 y.o. , MRN: [id number] , ADDRESS: [place] [address]  07/22/2018 -   Chief Complaint  Patient presents with  . Follow-up    Pt states it has been rough since last visit. States he has been coughing with  white to yellow phlegm, wheezing, increased SOB which has been going on x2-3 weeks now. Pt denies any real complaints of chest tightness/chest pain.     HPI RY MOODY II 74 y.o. -presents for follow-up of his advanced COPD with chronic hypoxemic respiratory failure.  At last visit we introduce Daliresp as is an effort to prevent COPD flareups.  After some trepidation he started taking it.  He is currently on full dose 5 mcg daily and is tolerating it well.  However he does not think it is reduced the frequency of flareups but again it is too soon to tell.  Approximately over a week ago after some sick contact exposure he had symptoms and signs of COPD exacerbation.  He was given I suspect Levaquin but this caused some confusion.  After that he was changed to doxycycline.  Today's his last of 7 days with doxycycline he is better.  COPD CAT score is improved to 26 but he still is coughing quite a bit and has mucus and feels that he is still not fully back to baseline.  He had a chest x-ray July 13, 2018 that reports this is clear.  He continues on oxygen Spiriva and Symbicort.  There are no other new issues.  No chest pain edema hemoptysis or weight loss.    OV 03/05/2019  Subjective:  Patient ID: Marcelyn Bruins, male , DOB: [birth year] , age 61 y.o. , MRN: [id number] , ADDRESS: [place] [address]   03/05/2019 -   Chief Complaint  Patient presents with  . COPD with acute exacerbation    Feels it is a little worse since June. Has cough with congestion, was white in color. But this morning it has a yellow and green color to it.     HPI JAXSEN BERNHART II 74 y.o. -returns for his advanced COPD follow-up.  He says that since his last visit he has been doing overall fair and stable.  Uses 3 L of oxygen, Daliresp, Spiriva, Symbicort.  He has been avoiding risk activities for COVID-19.  He says in the last few weeks he has had increased congestion he does not think this is  COVID-19.  Today he has green-yellow sputum.  He is asking about taking a flu shot.    OV 05/07/2019  Subjective:  Patient ID: Marcelyn Bruins, male , DOB: 12/05/1945 , age 71 y.o. , MRN: [id number] , ADDRESS: [place] [address]   05/07/2019 -   Chief Complaint  Patient presents with  . Follow-up    Pt states his breathing is at his baseline. Pt c/o prod cough with clear mucus - baseline for pt. Pt denies CP/tightness and f/c/s.    Advance COPD with chronic hypoxemic respiratory failure.  Baseline functional status ECOG 3-4  HPI Lesia Sago II 74 y.o. -presents for COPD follow-up.  He continues to be stable using oxygen.  His current COPD CAT score is 30 which is his baseline.  He recently had a vascular surgery.  He is on oxygen, prednisone, Daliresp, Spiriva and Symbicort.  He is gone back to taking azithromycin for prophylaxis m again COPD exacerbation.        OV 11/11/2019  Subjective:  Patient ID: Lesia Sago II,  male , DOB: 1945-10-09 , age 63 y.o. , MRN: [id number] , ADDRESS: [place] [address]   11/11/2019 -   Chief Complaint  Patient presents with  . Follow-up    Pt states his breathing is progressively becoming worse and states he feels like he may be in a flare with the COPD. Pt states he is coughing up yellow-green phlegm and also has complaints of wheezing.   Advance COPD patient with recurrent COPD exacerbations.  HPI COSME JACOB II 74 y.o. -presents for follow-up.  He says last month he saw [name] nurse practitioner.  He says he was given a different antibiotic that really helped.  Review of the records indicate one time it was Augmentin another time it was [place].  He believes the second 1 really helped.  He feels in another exacerbation.  He is having green sputum.  He says with the flutter valve and exacerbation a lot of sputum comes out but between exacerbations is very minimal.  He is frustrated by his  recurrent exacerbations.  He did not tolerate Trelegy in the past.  He is on Spiriva, Symbicort, oxygen 3 L, flutter valve, Flonase, Roflumilast and 3 times a week of azithromycin.  He is looking to see if he can improve his quality of life.  His COPD CAT score is 34.  His last CT scan of the chest was in 2019.     CAT COPD Symptom & Quality of Life Score (GSK trademark) 0 is no burden. 5 is highest burden 12/05/2017  01/29/2018  05/11/2018  06/10/2018  07/22/2018  03/05/2019  05/07/2019   Never Cough -> Cough all the time 3 1 5 5 2 3 3   No phlegm in chest -> Chest is full of phlegm 2 2 4 5 3  3.5 3  No chest tightness -> Chest feels very tight 1 0 3 0 1 2 3   No dyspnea for 1 flight stairs/hill -> Very dyspneic for 1 flight of stairs 5 4 5 5 5 5 5   No limitations for ADL at home -> Very limited with ADL at home 5 5 5 5 5 5 4   Confident leaving home -> Not at all confident leaving home 3 3 3 4 4 3 4   Sleep soundly -> Do not sleep soundly because of lung condition 3 1 2  0 1 4 4   Lots of Energy -> No energy at all 4 4 5 5 5 4 4   TOTAL Score (max 40)  26 20 37 29 26 29.5 30   CAT Score 11/11/2019 03/05/2019 04/03/2018  Total CAT Score 32 30 30     OV 11/26/2019 - telephone visit. 2PHI identified. Risks, benefit, limitation explained  Subjective:  Patient ID: Marcelyn Bruins, male , DOB: 1945/09/15 , age 69 y.o. , MRN: [id number] , ADDRESS: [place]  [id number] Advanced COPD with recurrent exacerbation now colonized by pseudomonas  11/26/2019 -    HPI MANOAH DECKARD II 74 y.o. -last seen by myself Nov 11, 2019.  At that time we decided to do a work-up for recurrent exacerbations.  His immunoglobulin levels are normal.  His sputum grew Pseudomonas that is pansensitive.  He CT scan of the chest shows right upper lobe worsening fibrotic pattern.  There is no mass per se.  I personally visualized the CT chest.  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  Today in the visit he tells me after 3 days he had nausea and then he had joint pain on the third day and quit taking it.  He still continues to be symptomatic.     ROS - per HPI  CT Chest Wo Contrast  Result Date: 11/25/2019 CLINICAL DATA:  COPD exacerbation. Severe shortness of breath. EXAM: CT CHEST WITHOUT CONTRAST TECHNIQUE: Multidetector CT imaging of the chest was performed following the standard protocol without IV contrast. COMPARISON:  04/07/2018. FINDINGS: Cardiovascular: The heart size is normal. There is no pericardial effusion. Aortic atherosclerosis. Three vessel coronary artery atherosclerotic calcifications identified Mediastinum/Nodes: No enlarged mediastinal or axillary lymph nodes. Thyroid gland, trachea, and esophagus demonstrate no significant findings. Lungs/Pleura: No pleural effusion identified. Advanced changes of centrilobular and paraseptal emphysema with mild bullous changes. Progressive confluent fibrotic with extensive architectural distortion and volume loss no acute airspace consolidation, atelectasis or pneumothorax. Scattered, bilateral areas of pleuroparenchymal scarring identified changes within the apical portions of the right upper lobe are again noted. Upper Abdomen: Bilateral low-attenuation adrenal nodules appears similar to previous exam and are compatible with benign adenomas. No acute abnormality identified within the imaged portions of the upper abdomen. Musculoskeletal: Mild scoliosis and degenerative disc disease noted within the thoracic spine. Stable chronic compression deformities at T6, T7 and T8. No new compression fractures. IMPRESSION: 1. No acute cardiopulmonary abnormalities. 2. Progressive confluent fibrotic changes within the right upper lobe with extensive architectural distortion and volume loss. Likely the sequelae of prior inflammation/infection. 3. Aortic atherosclerosis,  in addition to 3 vessel coronary artery disease. Please note that although the presence of coronary artery calcium documents the presence of coronary artery disease, the severity of this disease and any potential stenosis cannot be assessed on this non-gated CT examination. Assessment for potential risk factor modification, dietary therapy or pharmacologic therapy may be warranted, if clinically indicated. 4. Bilateral adrenal adenomas. 5. Stable chronic compression deformities at T6, T7 and T8. 6. Diffuse bronchial wall thickening with emphysema, as above; imaging findings suggestive of underlying COPD. Aortic Atherosclerosis (ICD10-I70.0) and Emphysema (ICD10-J43.9). Electronically Signed   By: Kerby Moors M.D.   On: 11/25/2019 12:21      has a past medical history of Chest x-ray abnormality, Chronic back pain, Chronic rhinitis, Compressed spine fracture (HCC), COPD (chronic obstructive pulmonary disease) (HCC), Dyspnea, Emphysema lung ([place]), Emphysema of lung ([place]), On home oxygen therapy, Onychomycosis, Orthostatic hypotension, PAD (peripheral artery disease) ([place]), Pneumonia, Pulmonary embolism ([place]) (04/07/2018), Skin cancer, and Vertigo.   reports that he quit smoking about 2 years ago. His smoking use included pipe and cigarettes. He started smoking about 54 years ago. He has a 104.00 pack-year smoking history. He has never used smokeless tobacco.  Past Surgical History:  Procedure Laterality Date  . APPENDECTOMY    . ENDARTERECTOMY FEMORAL Right 04/19/2019   Procedure: ENDARTERECTOMY RIGHT COMMON FEMORAL;  Surgeon: Elam Dutch, MD;  Location: [facility];  Service: Vascular;  Laterality: Right;  . LOWER EXTREMITY ANGIOGRAPHY  12/11/2018  . LOWER EXTREMITY ANGIOGRAPHY N/A 12/11/2018   Procedure: LOWER EXTREMITY ANGIOGRAPHY;  Surgeon: Elam Dutch, MD;  Location: [facility];  Service: Cardiovascular;  Laterality: N/A;  . PATCH ANGIOPLASTY Right 04/19/2019   Procedure: Patch  Angioplasty;  Surgeon: Elam Dutch, MD;  Location: [facility];  Service: Vascular;  Laterality: Right;  . PERIPHERAL VASCULAR INTERVENTION Right 12/11/2018   Procedure: PERIPHERAL VASCULAR INTERVENTION;  Surgeon: Elam Dutch, MD;  Location: [facility];  Service: Cardiovascular;  Laterality: Right;  Common Iliac   . SKIN CANCER EXCISION     "lips, face, ears, arms" (04/07/2018)  . TONSILLECTOMY      Allergies  Allergen Reactions  . Tape Other (See Comments)    SKIN IS VERY THIN; CAN ONLY USE COBAN WRAPS DUE TO CONDITION OF SKIN!!  . Levaquin [Levofloxacin]     hallucinations    Immunization History  Administered Date(s) Administered  . Fluad Quad(high Dose 65+) 03/16/2019  . Influenza Split 04/24/2012  . Influenza Whole 05/08/2009, 04/08/2011  . Influenza, High Dose Seasonal PF 05/01/2017, 03/31/2018  . Influenza,inj,Quad PF,6+ Mos 04/27/2013, 04/15/2014, 04/24/2015  . Influenza,inj,quad, With Preservative 05/01/2018  . Influenza-Unspecified 05/13/2016  . PFIZER SARS-COV-2 Vaccination 07/30/2019, 08/20/2019  . Pneumococcal Conjugate-13 02/25/2014  . Pneumococcal Polysaccharide-23 08/15/2011, 12/19/2016  . Pneumococcal-Unspecified 03/08/2017  . Td 02/21/2010  . Tdap 11/05/2017  . Zoster 01/29/2012    Family History  Problem Relation Age of Onset  . Heart disease Mother        CABG in her 70s, nonsmoker  . Cancer Mother   . Stroke Father   . Heart disease Father        Died of MI at age 68, smoker  . Hepatitis Sister   . Coronary artery disease Other        male 1st degree relative <60     Current Outpatient Medications:  .  acetaminophen (TYLENOL) 500 MG tablet, Take 1,000 mg by mouth 2 (two) times daily. , Disp: , Rfl:  .  albuterol (VENTOLIN HFA) 108 (90 Base) MCG/ACT inhaler, USE 2 PUFFS EVERY 6 HOURS  AS NEEDED FOR WHEEZING, Disp: 18 g, Rfl: 3 .  azithromycin (ZITHROMAX) 250 MG tablet, Take 1 tablet (250 mg total) by mouth every Monday, Wednesday,  and Friday., Disp: 45 tablet, Rfl: 1 .  Budeson-Glycopyrrol-Formoterol (BREZTRI AEROSPHERE) 160-9-4.8 MCG/ACT AERO, Inhale 2 puffs into the lungs in the morning and at bedtime., Disp: 5.9 g, Rfl: 0 .  Budeson-Glycopyrrol-Formoterol (BREZTRI AEROSPHERE) 160-9-4.8 MCG/ACT AERO, Inhale 2 puffs into the lungs 2 (two) times daily., Disp: 17.7 g, Rfl: 0 .  budesonide-formoterol (SYMBICORT) 160-4.5 MCG/ACT inhaler, Inhale 2 puffs into the lungs 2 (two) times daily., Disp: 30 g, Rfl: 3 .  Calcium Carbonate-Vitamin D (CALCIUM 600+D PO), Take 2 tablets by mouth daily., Disp: , Rfl:  .  cefdinir (OMNICEF) 300 MG capsule, Take 1 capsule (300 mg total) by mouth 2 (two) times daily., Disp: 10 capsule, Rfl: 0 .  ciprofloxacin (CIPRO) 500 MG tablet, Take 1 tablet (500 mg total) by mouth 2 (two) times daily., Disp: 20 tablet, Rfl: 0 .  cycloSPORINE (RESTASIS) 0.05 % ophthalmic emulsion, 1 drop 2 (two) times daily., Disp: , Rfl:  .  fluticasone (FLONASE) 50 MCG/ACT nasal spray, Place 2 sprays into both nostrils daily., Disp: 16 g, Rfl: 3 .  furosemide (LASIX) 20 MG tablet, TAKE 1 TABLET BY MOUTH  DAILY, Disp: 90 tablet, Rfl: 3 .  Misc. Devices (PULSE OXIMETER FOR FINGER) MISC, 1 Device by Does not apply route as needed., Disp: 1 each, Rfl: 0 .  OXYGEN, Inhale 3 L into the lungs continuous. , Disp: , Rfl:  .  predniSONE (DELTASONE) 10 MG tablet, Take 1 tablet (10 mg total) by mouth daily with breakfast., Disp: 90 tablet, Rfl: 0 .  predniSONE (DELTASONE) 10 MG tablet, Take 4x3days, 3x3days, 2x3days, then continue on 10mg  daily, Disp: 90 tablet, Rfl: 1 .  Respiratory  Therapy Supplies (FLUTTER) DEVI, 10 times Twice a day and prn as needed, may increase if feeling worse, Disp: 1 each, Rfl: 0 .  rivaroxaban (XARELTO) 20 MG TABS tablet, TAKE 1 TABLET BY MOUTH  DAILY, Disp: 90 tablet, Rfl: 3 .  roflumilast (DALIRESP) 500 MCG TABS tablet, Take 1 tablet (500 mcg total) by mouth daily., Disp: 90 tablet, Rfl: 2 .  sodium  chloride HYPERTONIC 3 % nebulizer solution, Take by nebulization daily., Disp: 90 mL, Rfl: 12 .  SPIRIVA RESPIMAT 2.5 MCG/ACT AERS, USE 2 INHALATIONS BY MOUTH  ONCE DAILY (Patient taking differently: Inhale 2 puffs into the lungs daily. ), Disp: 12 g, Rfl: 3 .  traMADol (ULTRAM) 50 MG tablet, Take 1 tablet (50 mg total) by mouth every 12 (twelve) hours as needed for moderate pain or severe pain., Disp: 30 tablet, Rfl: 1      Objective:   There were no vitals filed for this visit.  Estimated body mass index is 23.73 kg/m as calculated from the following:   Height as of 11/11/19: 6' (1.829 m).   Weight as of 11/11/19: 175 lb (79.4 kg).  @WEIGHTCHANGE @  There were no vitals filed for this visit.   Physical Exam sounded normal on the phone Assessment:       ICD-10-CM   1. Stage 4 very severe COPD by GOLD classification ([facility])  J44.9   2. Chronic respiratory failure with hypoxia (HCC)  J96.11   3. Pneumonia of right lower lobe due to Pseudomonas species (HCC)  J15.1    Pansensitive Pseudomonas  Allergies  Allergen Reactions  . Tape Other (See Comments)    SKIN IS VERY THIN; CAN ONLY USE COBAN WRAPS DUE TO CONDITION OF SKIN!!  . Levaquin [Levofloxacin]     hallucinations       Plan:     Patient Instructions     ICD-10-CM   1. 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 Chronic respiratory failure with hypoxia (HCC)  J96.11   3. Pneumonia of right lower lobe due to Pseudomonas species ([place])  J15.1    Likely right upper lobe pneumonia due to Pseudomonas Intolerance to ciprofloxacin Pseudomonas is pansensitive  Plan -Start home IV antibiotic therapy with cefepime x2 weeks  -Pharmacy consult Amber Yopp and set up home infusion therapy for this -Follow-up CT scan of the chest in 3 months without contrast for evaluation of the right upper lobe  Follow-up -15-minute face-to-face visit in 3-4 weeks  -This can be with nurse practitioner or [name]  face-to-face -Consider change of inhalers to BREZTRI at follow-up given improvement in mortality data     SIGNATURE    Dr. Brand Males, M.D., F.C.C.P,  Pulmonary and Critical Care Medicine Staff Physician, [place] Director - Interstitial Lung Disease  Program  Pulmonary [place] at [place], [place], [address]  Pager: [phone], If no answer or between  15:00h - 7:00h: call [phone] Telephone: [phone]  9:16 AM 11/26/2019

## 2019-11-26 NOTE — Patient Instructions (Addendum)
ICD-10-CM   1. 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 Chronic respiratory failure with hypoxia (HCC)  J96.11   3. Pneumonia of right lower lobe due to Pseudomonas species ([place])  J15.1    Likely right upper lobe pneumonia due to Pseudomonas Intolerance to ciprofloxacin Pseudomonas is pansensitive  Plan - add cipro to allergy - nausea and joint pain May 2021 -Start home IV antibiotic therapy with cefepime x2 weeks  -Pharmacy consult Amber Yopp and set up home infusion therapy for this -Follow-up CT scan of the chest in 3 months without contrast for evaluation of the right upper lobe  Follow-up -15-minute face-to-face visit in 3-4 weeks  -This can be with nurse practitioner or [name] face-to-face -Consider change of inhalers to [facility] at follow-up given improvement in mortality data

## 2019-11-26 NOTE — Addendum Note (Signed)
Addended by: Lia Foyer R on: 11/26/2019 01:38 PM   Modules accepted: Orders

## 2019-11-26 NOTE — Progress Notes (Signed)
Ct with emphysema and there is worsening fibrosis in the right upper lobe - not sure what that is about. Will discuss at office visit 11/26/19

## 2019-11-29 ENCOUNTER — Other Ambulatory Visit: Payer: Self-pay

## 2019-11-29 ENCOUNTER — Encounter: Payer: Self-pay | Admitting: Family Medicine

## 2019-11-29 ENCOUNTER — Ambulatory Visit (INDEPENDENT_AMBULATORY_CARE_PROVIDER_SITE_OTHER): Payer: Medicare Other | Admitting: Family Medicine

## 2019-11-29 VITALS — BP 124/68 | HR 106 | Temp 98.7°F | Ht 72.0 in | Wt 170.0 lb

## 2019-11-29 DIAGNOSIS — R319 Hematuria, unspecified: Secondary | ICD-10-CM

## 2019-11-29 DIAGNOSIS — J438 Other emphysema: Secondary | ICD-10-CM

## 2019-11-29 DIAGNOSIS — I10 Essential (primary) hypertension: Secondary | ICD-10-CM

## 2019-11-29 DIAGNOSIS — M818 Other osteoporosis without current pathological fracture: Secondary | ICD-10-CM

## 2019-11-29 DIAGNOSIS — R739 Hyperglycemia, unspecified: Secondary | ICD-10-CM

## 2019-11-29 DIAGNOSIS — E785 Hyperlipidemia, unspecified: Secondary | ICD-10-CM | POA: Diagnosis not present

## 2019-11-29 LAB — COMPREHENSIVE METABOLIC PANEL
ALT: 32 U/L (ref 0–53)
AST: 28 U/L (ref 0–37)
Albumin: 3.9 g/dL (ref 3.5–5.2)
Alkaline Phosphatase: 92 U/L (ref 39–117)
BUN: 18 mg/dL (ref 6–23)
CO2: 32 mEq/L (ref 19–32)
Calcium: 10.1 mg/dL (ref 8.4–10.5)
Chloride: 97 mEq/L (ref 96–112)
Creatinine, Ser: 1.11 mg/dL (ref 0.40–1.50)
GFR: 65.08 mL/min (ref >60.00)
Glucose, Bld: 99 mg/dL (ref 70–99)
Potassium: 4.8 mEq/L (ref 3.5–5.1)
Sodium: 140 mEq/L (ref 135–145)
Total Bilirubin: 0.5 mg/dL (ref 0.2–1.2)
Total Protein: 6.5 g/dL (ref 6.0–8.3)

## 2019-11-29 LAB — CBC WITH DIFFERENTIAL/PLATELET
Basophils Absolute: 0.1 10*3/uL (ref 0.0–0.1)
Basophils Relative: 1 % (ref 0.0–3.0)
Eosinophils Absolute: 0.2 10*3/uL (ref 0.0–0.7)
Eosinophils Relative: 1.6 % (ref 0.0–5.0)
HCT: 32.4 % — ABNORMAL LOW (ref 39.0–52.0)
Hemoglobin: 10.4 g/dL — ABNORMAL LOW (ref 13.0–17.0)
Lymphocytes Relative: 10.1 % — ABNORMAL LOW (ref 12.0–46.0)
Lymphs Abs: 1.2 10*3/uL (ref 0.7–4.0)
MCHC: 32.2 g/dL (ref 30.0–36.0)
MCV: 83.8 fl (ref 78.0–100.0)
Monocytes Absolute: 1.2 10*3/uL — ABNORMAL HIGH (ref 0.1–1.0)
Monocytes Relative: 10.1 % (ref 3.0–12.0)
Neutro Abs: 9.3 10*3/uL — ABNORMAL HIGH (ref 1.4–7.7)
Neutrophils Relative %: 77.2 % — ABNORMAL HIGH (ref 43.0–77.0)
Platelets: 321 10*3/uL (ref 150.0–400.0)
RBC: 3.86 Mil/uL — ABNORMAL LOW (ref 4.22–5.81)
RDW: 15.2 % (ref 11.5–15.5)
WBC: 12.1 10*3/uL — ABNORMAL HIGH (ref 4.0–10.5)

## 2019-11-29 LAB — URINALYSIS, MICROSCOPIC ONLY
RBC / HPF: NONE SEEN (ref 0–?)
WBC, UA: NONE SEEN (ref 0–?)

## 2019-11-29 MED ORDER — TRAMADOL HCL 50 MG PO TABS: 50.0000 mg | ORAL_TABLET | Freq: Two times a day (BID) | ORAL | 1 refills | Status: DC | PRN

## 2019-11-29 MED ORDER — FLUTICASONE PROPIONATE 50 MCG/ACT NA SUSP: 2.0000 | Freq: Every day | NASAL | 3 refills | Status: DC

## 2019-11-29 MED ORDER — FUROSEMIDE 20 MG PO TABS: ORAL_TABLET | ORAL | 3 refills | Status: DC

## 2019-11-30 ENCOUNTER — Telehealth: Payer: Self-pay | Admitting: Internal Medicine

## 2019-11-30 ENCOUNTER — Ambulatory Visit (INDEPENDENT_AMBULATORY_CARE_PROVIDER_SITE_OTHER): Payer: Medicare Other | Admitting: Internal Medicine

## 2019-11-30 ENCOUNTER — Telehealth: Payer: Self-pay | Admitting: *Deleted

## 2019-11-30 ENCOUNTER — Encounter: Payer: Self-pay | Admitting: Internal Medicine

## 2019-11-30 DIAGNOSIS — J42 Unspecified chronic bronchitis: Secondary | ICD-10-CM

## 2019-11-30 DIAGNOSIS — J209 Acute bronchitis, unspecified: Secondary | ICD-10-CM

## 2019-11-30 MED ORDER — CEFEPIME HCL 2 G IV SOLR: 2.0000 g | Freq: Three times a day (TID) | INTRAVENOUS | Status: DC

## 2019-11-30 NOTE — Telephone Encounter (Signed)
MR, please advise if you were given the paperwork by Jonelle Sidle during clinic?

## 2019-11-30 NOTE — Progress Notes (Signed)
[facility] for Infectious Disease  Reason for Consult: Acute exacerbation of chronic bronchitis Referring Provider: Dr. Brand Males  Assessment: I agree with the course of treatment with cefepime given his inability to tolerate fluoroquinolone antibiotics.  I have ordered PICC placement and 2 weeks of cefepime therapy.  He will follow-up here in 4 to 6 weeks.  He is in agreement with that plan.  Plan: 1. PICC placement 2. Cefepime 2 g IV every 8 hours for 2 weeks 3. Follow-up here in 4 to 6 weeks  Patient Active Problem List   Diagnosis Date Noted  . Acute exacerbation of chronic bronchitis ([place]) 11/30/2019    Priority: High  . Stage 4 very severe COPD by GOLD classification ([place]) 11/27/2018    Priority: High  . PAD (peripheral artery disease) ([place]) 12/11/2018  . Pre-operative respiratory examination 12/10/2018  . COPD with acute exacerbation ([place]) 11/27/2018  . DNR (do not resuscitate) 04/21/2018  . Diastolic dysfunction 44/96/7591  . Chronic pulmonary embolism ([place]) 04/07/2018  . Essential tremor 03/31/2018  . BPPV (benign paroxysmal positional vertigo), right 02/10/2018  . Osteoporosis 11/19/2017  . Syncope 11/02/2017  . Thoracic compression fracture ([place]) 11/02/2017  . Essential hypertension 11/02/2017  . Venous stasis dermatitis of both lower extremities 11/02/2017  . Chronic respiratory failure with hypoxia ([place]) 09/23/2017  . BPH associated with nocturia 06/25/2017  . Leukocytosis 12/19/2016  . Hyperglycemia 06/12/2016  . Hyperlipidemia 07/22/2014  . Onychomycosis 07/22/2014  . Left ankle swelling 07/22/2014  . History of skin cancer 05/17/2014  . Chronic rhinitis 10/28/2012  . Pulmonary nodule 09/23/2011  . Former smoker 08/23/2008  . COPD (chronic obstructive pulmonary disease) with emphysema ([place]) 08/23/2008  . History of colonic polyps 08/23/2008    Patient's Medications  New Prescriptions   CEFEPIME (MAXIPIME) 2 G INJECTION    Inject  2 g into the vein every 8 (eight) hours.  Previous Medications   ACETAMINOPHEN (TYLENOL) 500 MG TABLET    Take 1,000 mg by mouth 2 (two) times daily.    ALBUTEROL (VENTOLIN HFA) 108 (90 BASE) MCG/ACT INHALER    USE 2 PUFFS EVERY 6 HOURS  AS NEEDED FOR WHEEZING   AZITHROMYCIN (ZITHROMAX) 250 MG TABLET    Take 1 tablet (250 mg total) by mouth every Monday, Wednesday, and Friday.   BUDESON-GLYCOPYRROL-FORMOTEROL (BREZTRI AEROSPHERE) 160-9-4.8 MCG/ACT AERO    Inhale 2 puffs into the lungs in the morning and at bedtime.   BUDESON-GLYCOPYRROL-FORMOTEROL (BREZTRI AEROSPHERE) 160-9-4.8 MCG/ACT AERO    Inhale 2 puffs into the lungs 2 (two) times daily.   BUDESONIDE-FORMOTEROL (SYMBICORT) 160-4.5 MCG/ACT INHALER    Inhale 2 puffs into the lungs 2 (two) times daily.   CALCIUM CARBONATE-VITAMIN D (CALCIUM 600+D PO)    Take 2 tablets by mouth daily.   CYCLOSPORINE (RESTASIS) 0.05 % OPHTHALMIC EMULSION    1 drop 2 (two) times daily.   FLUTICASONE (FLONASE) 50 MCG/ACT NASAL SPRAY    Place 2 sprays into both nostrils daily.   FUROSEMIDE (LASIX) 20 MG TABLET    TAKE 1 TABLET BY MOUTH  DAILY   OXYGEN    Inhale 3 L into the lungs continuous.    PREDNISONE (DELTASONE) 10 MG TABLET    Take 1 tablet (10 mg total) by mouth daily with breakfast.   RESPIRATORY THERAPY SUPPLIES (FLUTTER) DEVI    10 times Twice a day and prn as needed, may increase if feeling worse   RIVAROXABAN (XARELTO) 20 MG TABS TABLET  TAKE 1 TABLET BY MOUTH  DAILY   ROFLUMILAST (DALIRESP) 500 MCG TABS TABLET    Take 1 tablet (500 mcg total) by mouth daily.   SODIUM CHLORIDE HYPERTONIC 3 % NEBULIZER SOLUTION    Take by nebulization daily.   SPIRIVA RESPIMAT 2.5 MCG/ACT AERS    USE 2 INHALATIONS BY MOUTH  ONCE DAILY   TRAMADOL (ULTRAM) 50 MG TABLET    Take 1 tablet (50 mg total) by mouth every 12 (twelve) hours as needed for moderate pain or severe pain.  Modified Medications   No medications on file  Discontinued Medications   No medications on  file    HPI: Francisco [name] is a 74 y.o. male with COPD had a recent increase in his chronic cough that is productive of yellow-green sputum.  He is also noted some more shortness of breath and wheezing.  He recently increased his supplemental nasal prong oxygen from 3 L to 3-1/2 L.  Sputum culture on 11/15/2019 grew Pseudomonas aeruginosa sensitive to all antibiotics tested.  He has a history of hallucinations after taking 1 dose of levofloxacin.  He was prescribed ciprofloxacin on 11/19/2019.  He developed severe nausea after each of his first 3 doses.  He also became very lethargic and developed right hip pain.  He stopped the ciprofloxacin and the acute symptoms resolved over the next 36 hours.  He did not have any improvement in his respiratory status therefore [name] recommended a course of IV cefepime.  He had a recent chest CT on 11/25/2019 which showed stable, fibrotic right upper scar without acute changes.  Both he and his wife have received their Covid vaccines.  Review of Systems: Review of Systems  Constitutional: Negative for chills, diaphoresis and fever.  Respiratory: Positive for cough, sputum production, shortness of breath and wheezing. Negative for hemoptysis.   Cardiovascular: Negative for chest pain.  Gastrointestinal: Negative for abdominal pain, diarrhea, nausea and vomiting.      Past Medical History:  Diagnosis Date  . Chest x-ray abnormality   . Chronic back pain    "mid and lower" (04/07/2018)  . Chronic rhinitis    -Sinus Ct 08/01/2009 >> Bilateral maxillary sinusitis with some mucosal thickeningin the sphenoid and frontal sinuses as well with air fluid levels present -chronic rhinitis flyer Aug 04, 2009  . Compressed spine fracture ([place])   . COPD (chronic obstructive pulmonary disease) [facility])    PFT's rec Jul 17, 2009  . Dyspnea   . Emphysema lung ([place])   . Emphysema of lung ([place])   . On home oxygen therapy    "3L; 24/7" (04/07/2018)  . Onychomycosis      Dr. Judi Cong  . Orthostatic hypotension    "since 10/2017" (04/07/2018)  . PAD (peripheral artery disease) ([place])   . Pneumonia    "twice in 1 year" (04/07/2018)  . Pulmonary embolism ([place]) 04/07/2018  . Skin cancer    "lips, face, ears, arms" (04/07/2018)  . Vertigo    "since ~ 02/2018" (04/07/2018)    Social History   Tobacco Use  . Smoking status: Former Smoker    Packs/day: 2.00    Years: 52.00    Pack years: 104.00    Types: Pipe, Cigarettes    Start date: 10/06/1965    Quit date: 10/06/2017    Years since quitting: 2.1  . Smokeless tobacco: Never Used  . Tobacco comment: 04/07/2018 "smoked cigarettes years ago; stopped ~ 20 yr ago; stopped smoking pipe in 09/2017"  Substance Use Topics  . Alcohol use: Yes    Alcohol/week: 28.0 standard drinks    Types: 28 Cans of beer per week    Comment: "04/15/2019 "4 beers/day; probably closer to 28/wk"  . Drug use: No    Family History  Problem Relation Age of Onset  . Heart disease Mother        CABG in her 43s, nonsmoker  . Cancer Mother   . Stroke Father   . Heart disease Father        Died of MI at age 25, smoker  . Hepatitis Sister   . Coronary artery disease Other        male 1st degree relative <60   Allergies  Allergen Reactions  . Tape Other (See Comments)    SKIN IS VERY THIN; CAN ONLY USE COBAN WRAPS DUE TO CONDITION OF SKIN!!  . Ciprofloxacin Nausea Only    Sick on stomach, weak/tired  . Levaquin [Levofloxacin]     hallucinations    OBJECTIVE: Vitals:   11/30/19 1345  BP: (!) 155/88  Pulse: (!) 109  Temp: 98.1 F (36.7 C)  TempSrc: Oral  SpO2: 99%   There is no height or weight on file to calculate BMI.   Physical Exam Constitutional:      Comments: He is very pleasant.  He is accompanied by his wife.  Cardiovascular:     Rate and Rhythm: Normal rate and regular rhythm.     Heart sounds: No murmur.  Pulmonary:     Effort: Pulmonary effort is normal.     Breath sounds: Wheezing present. No rhonchi  or rales.     Comments: He is using supplemental nasal prong oxygen.    Microbiology: No results found for this or any previous visit (from the past 240 hour(s)).  Michel Bickers, MD [facility] for Infectious [place] [facility] [phone] pager   [phone] cell 11/30/2019, 2:20 PM

## 2019-11-30 NOTE — Telephone Encounter (Signed)
Called --[facility] radiology and talk to (Ashley)--verbal order PICC line -in and short-stay by [name].  Appt- 12/03/19 @ 10:00 but arrived 9:30am. Pt been notified with the appointment.

## 2019-11-30 NOTE — Assessment & Plan Note (Signed)
I agree with a course of treatment with cefepime given his inability to tolerate fluoroquinolone antibiotics.  I have ordered PICC placement and 2 weeks of cefepime therapy.  He will follow-up here in 4 to 6 weeks.

## 2019-11-30 NOTE — Telephone Encounter (Signed)
Will route message to [facility] for follow up.

## 2019-12-02 ENCOUNTER — Other Ambulatory Visit (HOSPITAL_COMMUNITY): Payer: Self-pay

## 2019-12-02 ENCOUNTER — Telehealth: Payer: Self-pay | Admitting: Internal Medicine

## 2019-12-02 NOTE — Telephone Encounter (Signed)
Called and spoke with patient letting him know that he can stop azithromycin and Daliresp. Patient expressed understanding. Nothing further needed at this time.

## 2019-12-02 NOTE — Telephone Encounter (Signed)
MR, please advise if pt needs to stop any of meds currenly taking or if he should continue to take all meds as prescribed.

## 2019-12-02 NOTE — Telephone Encounter (Signed)
He can stop his outpatient azithromycin and roflumilast during the time ofr IV antibiotic

## 2019-12-03 ENCOUNTER — Other Ambulatory Visit: Payer: Self-pay | Admitting: Internal Medicine

## 2019-12-03 ENCOUNTER — Encounter (HOSPITAL_COMMUNITY)
Admission: RE | Admit: 2019-12-03 | Discharge: 2019-12-03 | Disposition: A | Discharge status: Home / Self Care | Payer: Medicare Other | Source: Ambulatory Visit | Attending: Internal Medicine | Admitting: Internal Medicine

## 2019-12-03 ENCOUNTER — Other Ambulatory Visit: Payer: Self-pay

## 2019-12-03 ENCOUNTER — Ambulatory Visit (HOSPITAL_COMMUNITY)
Admission: RE | Admit: 2019-12-03 | Discharge: 2019-12-03 | Disposition: A | Discharge status: Home / Self Care | Payer: Medicare Other | Source: Ambulatory Visit | Attending: Internal Medicine | Admitting: Internal Medicine

## 2019-12-03 DIAGNOSIS — J209 Acute bronchitis, unspecified: Secondary | ICD-10-CM

## 2019-12-03 DIAGNOSIS — J42 Unspecified chronic bronchitis: Secondary | ICD-10-CM | POA: Diagnosis not present

## 2019-12-03 IMAGING — XA IR PICC >5YO
2 series · 2 of 2 positions shown · IV contrast (agent unspecified)
Comparison: none

INDICATION: Patient history of chronic bronchitis presents for PICC line
placement for ongoing antibiotic access

EXAM:
ULTRASOUND AND FLUOROSCOPIC GUIDED PICC LINE INSERTION
MEDICATIONS:
Lidocaine 1% 2 mL
CONTRAST:  None
FLUOROSCOPY TIME:  1 seconds (1 mGy)
COMPLICATIONS:
None immediate.
TECHNIQUE: The procedure, risks, benefits, and alternatives were explained to
the patient and informed written consent was obtained.

[Series 1: dr (person_name) · 1 of 1 slices shown]
[im 1/1]
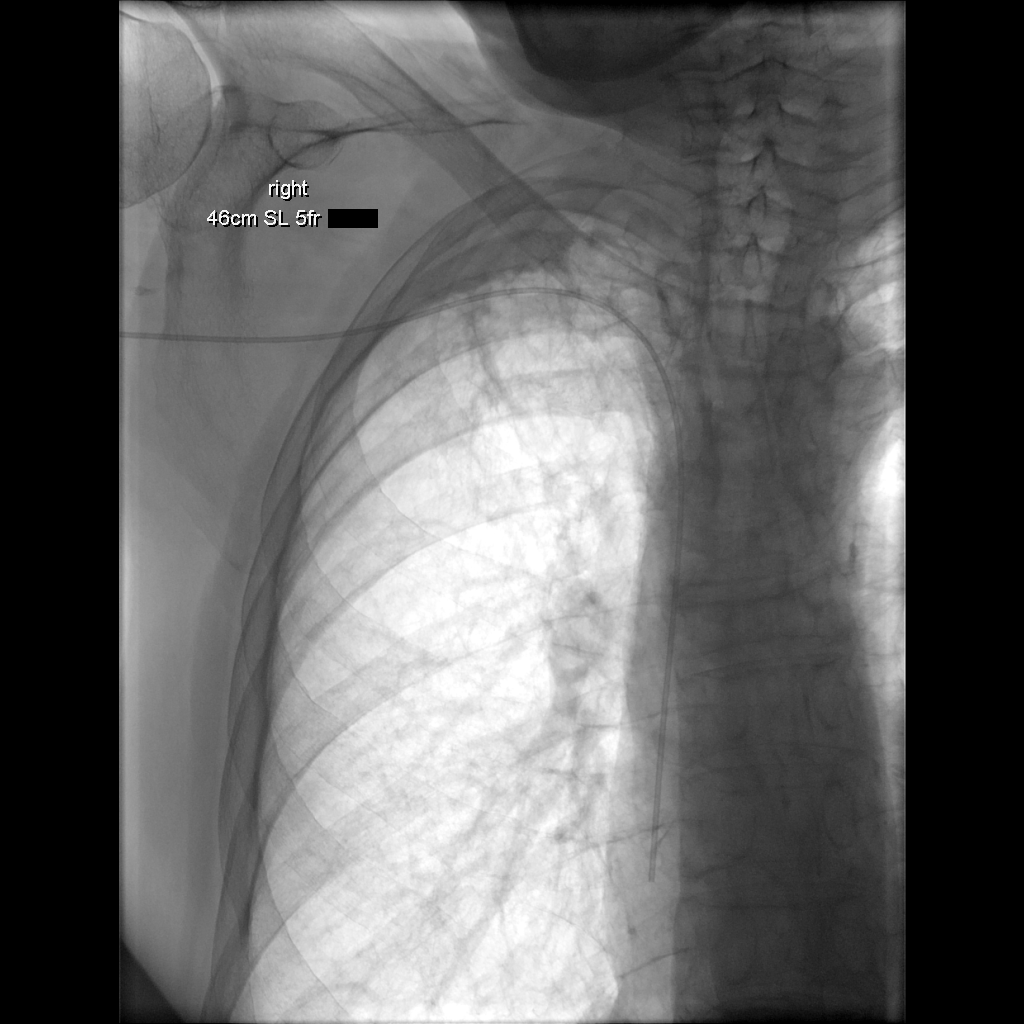

[Series 1: ir picc >5yo · 1 of 1 slices shown]
[im 1/1]
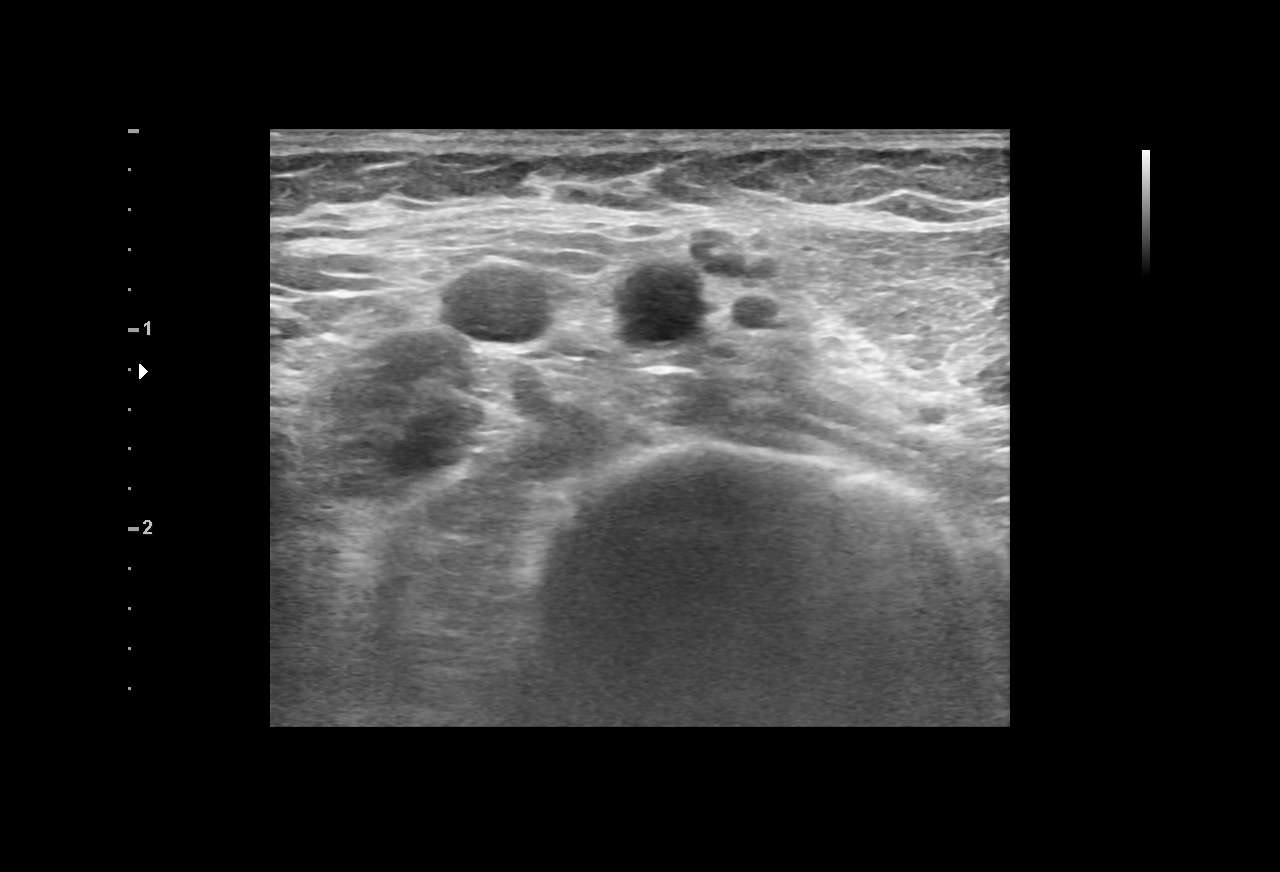

[2 of 2 positions shown; findings below may reference images not displayed]

The right upper extremity was prepped with chlorhexidine in a
sterile fashion, and a sterile drape was applied covering the
operative field. Maximum barrier sterile technique with sterile
gowns and gloves were used for the procedure. A timeout was
performed prior to the initiation of the procedure. Local anesthesia
was provided with 1% lidocaine.

After the overlying soft tissues were anesthetized and a
micropuncture kit was utilized to access the right basilic vein.
Real-time ultrasound guidance was utilized for vascular access
including the acquisition of a permanent ultrasound image
documenting patency of the accessed vessel.

A guidewire was advanced to the level of the superior caval-atrial
junction for measurement purposes and the PICC line was cut to
length. A peel-away sheath was placed and a cm, 5 French, dual lumen
was inserted to level of the superior caval-atrial junction. A post
procedure spot fluoroscopic was obtained. The catheter easily
aspirated and flushed and was secured in place. A dressing was
placed. The patient tolerated the procedure well without immediate
post procedural complication.
FINDINGS: After catheter placement, the tip lies within the superior
cavoatrial junction. The catheter aspirates and flushes normally and
is ready for immediate use.
IMPRESSION: Successful ultrasound and fluoroscopic guided placement of a right
basilic vein approach, 46 cm, 5 French, single lumen PICC with tip
at the superior caval-atrial junction. The PICC line is ready for
immediate use.

Read by: MUNGONENA, NP

## 2019-12-03 MED ORDER — HEPARIN SOD (PORK) LOCK FLUSH 100 UNIT/ML IV SOLN
250.0000 [IU] | INTRAVENOUS | Status: AC | PRN
  Administered 2019-12-03: 250 [IU]

## 2019-12-03 MED ORDER — LIDOCAINE HCL 1 % IJ SOLN
INTRAMUSCULAR | Status: AC
  Filled 2019-12-03: qty 20

## 2019-12-03 MED ORDER — LIDOCAINE HCL 1 % IJ SOLN
INTRAMUSCULAR | Status: DC | PRN
  Administered 2019-12-03: 5 mL

## 2019-12-03 MED ORDER — SODIUM CHLORIDE 0.9 % IV SOLN
2.0000 g | Freq: Once | INTRAVENOUS | Status: AC
  Administered 2019-12-03: 2 g via INTRAVENOUS
  Filled 2019-12-03: qty 2

## 2019-12-03 MED ORDER — HEPARIN SOD (PORK) LOCK FLUSH 100 UNIT/ML IV SOLN
INTRAVENOUS | Status: AC
  Filled 2019-12-03: qty 5

## 2019-12-03 NOTE — Procedures (Signed)
Basillic vein single lumen PICC placed without immediate complications. Length 46 cm tip SVC / right atrial junction. Okay to use. Medication used - 1& lidocaine and subcutaneous tissues. EBL < 2 ml.

## 2019-12-03 NOTE — Telephone Encounter (Signed)
I cannot remember as of 12/03/2019 6:10 AM  . I am in [facility] clinic and will only come back to [place] after weekend. You can check my desk or ask Jonelle Sidle  Thanks    SIGNATURE    Dr. Brand Males, M.D., F.C.C.P,  Pulmonary and Critical Care Medicine Staff Physician, [place] Director - Interstitial Lung Disease  Program  Pulmonary [place] at [place], [place], [address]  Pager: [phone], If no answer or between  15:00h - 7:00h: call [phone] Telephone: [phone]  6:10 AM 12/03/2019

## 2019-12-07 ENCOUNTER — Encounter: Payer: Self-pay | Admitting: Internal Medicine

## 2019-12-07 NOTE — Telephone Encounter (Signed)
This was located in MR's papers. It is in his sign folder for him to sign.

## 2019-12-13 ENCOUNTER — Encounter: Payer: Self-pay | Admitting: Internal Medicine

## 2019-12-14 NOTE — Telephone Encounter (Signed)
Spoke with pt to make aware of TP's recs.  Nothing further needed at this time- will close encounter.

## 2019-12-14 NOTE — Telephone Encounter (Signed)
Called wife Susie d/t nature of symptoms.  States that pt is on day 10 of 14 day abx treatment. Pt c/o extreme fatigue, unable to walk at all.  Wife states that pt took approx 12 steps today and pt's O2 dropped to 80% on 3lpm.  Wife increased O2 to 4lpm, O2 increased to low 90's%.  Wife states that she is concerned that pt will not be able to transport to office for his visit with [name] on Thursday at 9:30.  Wife says she does not wish to take him to the hospital if at all possible.   Wants to see if we can recommend anything else prior to appt on Thursday.    Sending to TP for recs.  Thanks!

## 2019-12-14 NOTE — Telephone Encounter (Signed)
Sorry to hear he is this sick but if unable to do anyting and getting worse on IV abx it does sound like he needs to go to the ER . Not sure what else can be added at home to help  Would increase Oxygen 4l/m with activity to keep sats >90%.   Please contact office for sooner follow up if symptoms do not improve or worsen or seek emergency care

## 2019-12-15 ENCOUNTER — Emergency Department (HOSPITAL_COMMUNITY): Payer: Medicare Other

## 2019-12-15 ENCOUNTER — Emergency Department (HOSPITAL_COMMUNITY)
Admission: EM | Admit: 2019-12-15 | Discharge: 2019-12-15 | Disposition: A | Discharge status: Home / Self Care | Payer: Medicare Other | Attending: Emergency Medicine | Admitting: Emergency Medicine

## 2019-12-15 DIAGNOSIS — Z87891 Personal history of nicotine dependence: Secondary | ICD-10-CM | POA: Insufficient documentation

## 2019-12-15 DIAGNOSIS — I5030 Unspecified diastolic (congestive) heart failure: Secondary | ICD-10-CM | POA: Diagnosis not present

## 2019-12-15 DIAGNOSIS — I2782 Chronic pulmonary embolism: Secondary | ICD-10-CM | POA: Diagnosis not present

## 2019-12-15 DIAGNOSIS — Z9981 Dependence on supplemental oxygen: Secondary | ICD-10-CM | POA: Diagnosis not present

## 2019-12-15 DIAGNOSIS — Z85828 Personal history of other malignant neoplasm of skin: Secondary | ICD-10-CM | POA: Diagnosis not present

## 2019-12-15 DIAGNOSIS — J449 Chronic obstructive pulmonary disease, unspecified: Secondary | ICD-10-CM | POA: Diagnosis not present

## 2019-12-15 DIAGNOSIS — I110 Hypertensive heart disease with heart failure: Secondary | ICD-10-CM | POA: Diagnosis not present

## 2019-12-15 DIAGNOSIS — J441 Chronic obstructive pulmonary disease with (acute) exacerbation: Secondary | ICD-10-CM

## 2019-12-15 DIAGNOSIS — Z79899 Other long term (current) drug therapy: Secondary | ICD-10-CM | POA: Insufficient documentation

## 2019-12-15 DIAGNOSIS — J151 Pneumonia due to Pseudomonas: Secondary | ICD-10-CM | POA: Insufficient documentation

## 2019-12-15 DIAGNOSIS — Z7901 Long term (current) use of anticoagulants: Secondary | ICD-10-CM | POA: Diagnosis not present

## 2019-12-15 DIAGNOSIS — R0602 Shortness of breath: Secondary | ICD-10-CM | POA: Diagnosis present

## 2019-12-15 LAB — BASIC METABOLIC PANEL
Anion gap: 13 (ref 5–15)
BUN: 21 mg/dL (ref 8–23)
CO2: 23 mmol/L (ref 22–32)
Calcium: 9 mg/dL (ref 8.9–10.3)
Chloride: 99 mmol/L (ref 98–111)
Creatinine, Ser: 1.37 mg/dL — ABNORMAL HIGH (ref 0.61–1.24)
GFR calc Af Amer: 59 mL/min — ABNORMAL LOW (ref >60)
GFR calc non Af Amer: 51 mL/min — ABNORMAL LOW (ref >60)
Glucose, Bld: 101 mg/dL — ABNORMAL HIGH (ref 70–99)
Potassium: 4 mmol/L (ref 3.5–5.1)
Sodium: 135 mmol/L (ref 135–145)

## 2019-12-15 LAB — CBC
HCT: 32.7 % — ABNORMAL LOW (ref 39.0–52.0)
Hemoglobin: 9.8 g/dL — ABNORMAL LOW (ref 13.0–17.0)
MCH: 26.7 pg (ref 26.0–34.0)
MCHC: 30 g/dL (ref 30.0–36.0)
MCV: 89.1 fL (ref 80.0–100.0)
Platelets: 187 10*3/uL (ref 150–400)
RBC: 3.67 MIL/uL — ABNORMAL LOW (ref 4.22–5.81)
RDW: 14.6 % (ref 11.5–15.5)
WBC: 18.1 10*3/uL — ABNORMAL HIGH (ref 4.0–10.5)
nRBC: 0 % (ref 0.0–0.2)

## 2019-12-15 LAB — TROPONIN I (HIGH SENSITIVITY)
Troponin I (High Sensitivity): 10 ng/L (ref <18)
Troponin I (High Sensitivity): 8 ng/L

## 2019-12-15 LAB — EXPECTORATED SPUTUM ASSESSMENT W GRAM STAIN, RFLX TO RESP C

## 2019-12-15 LAB — BRAIN NATRIURETIC PEPTIDE: B Natriuretic Peptide: 186.4 pg/mL — ABNORMAL HIGH (ref 0.0–100.0)

## 2019-12-15 IMAGING — CT CT ANGIO CHEST
2 of 6 series · 18 of 46 positions shown · IV contrast (omnipaque)
Comparison: [DATE] and [DATE]

CLINICAL DATA: Shortness of breath.

EXAM:
CT ANGIOGRAPHY CHEST WITH CONTRAST
TECHNIQUE: Multidetector CT imaging of the chest was performed using the
standard protocol during bolus administration of intravenous
contrast. Multiplanar CT image reconstructions and MIPs were
obtained to evaluate the vascular anatomy.
CONTRAST:  75mL OMNIPAQUE IOHEXOL 350 MG/ML SOLN

[Series 6: thins · axial · 0.76mm/px · z∈[+1299,+1600]mm · 15 of 331 slices shown]
[im 15/331  lung]
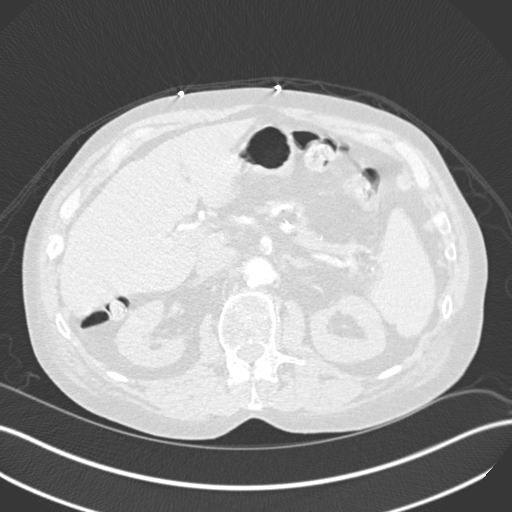
[im 44/331  soft-tissue]
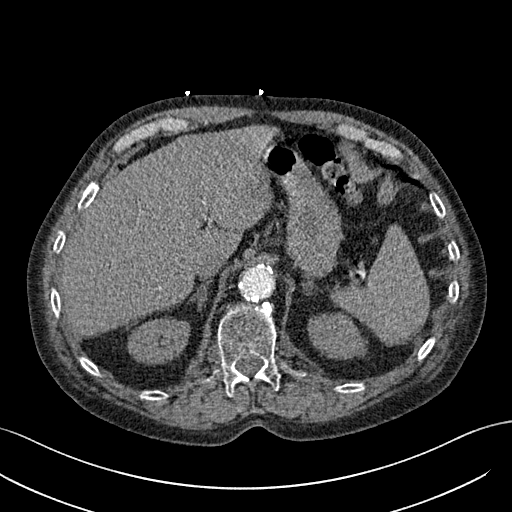
[im 58/331  lung]
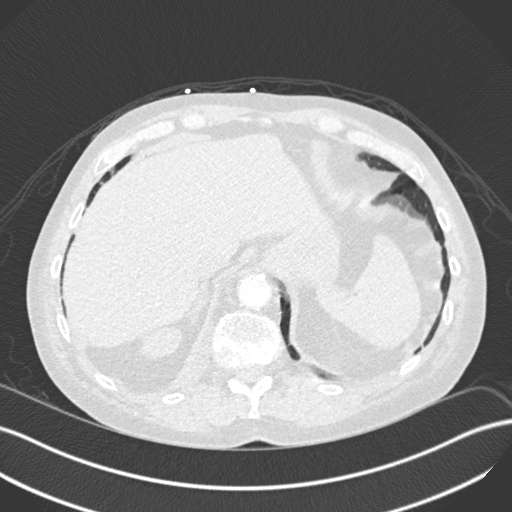
[im 87/331  soft-tissue]
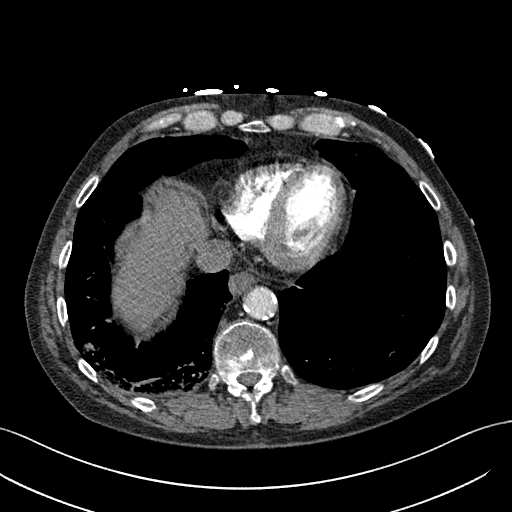
[im 101/331  lung]
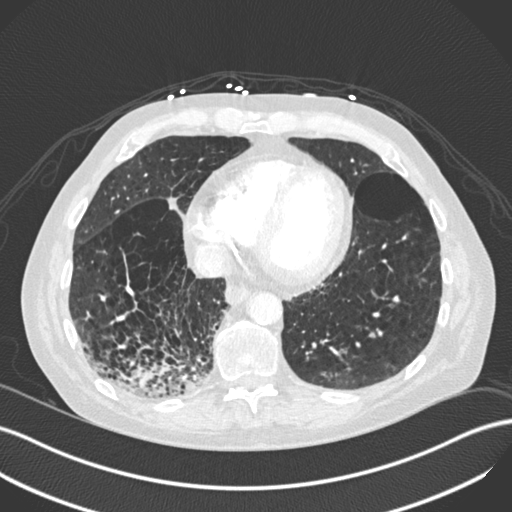
[im 130/331  soft-tissue]
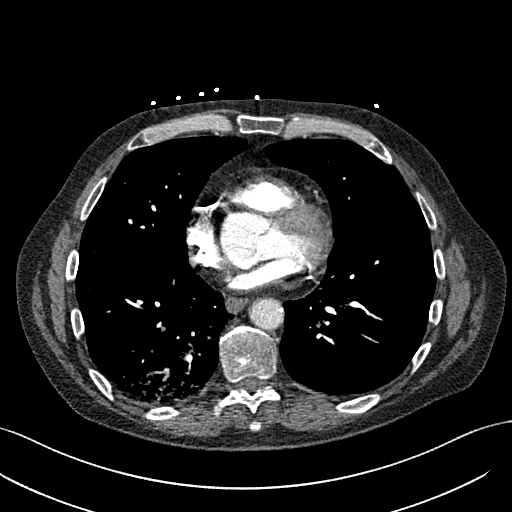
[im 144/331  lung]
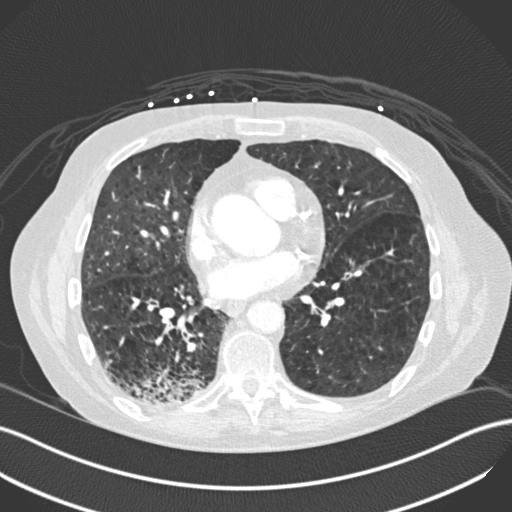
[im 173/331  soft-tissue]
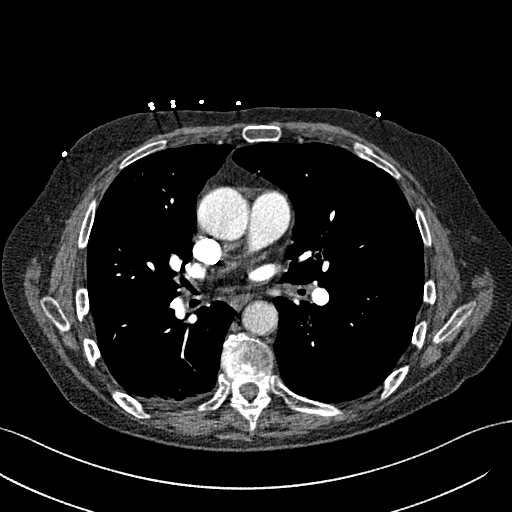
[im 187/331  lung]
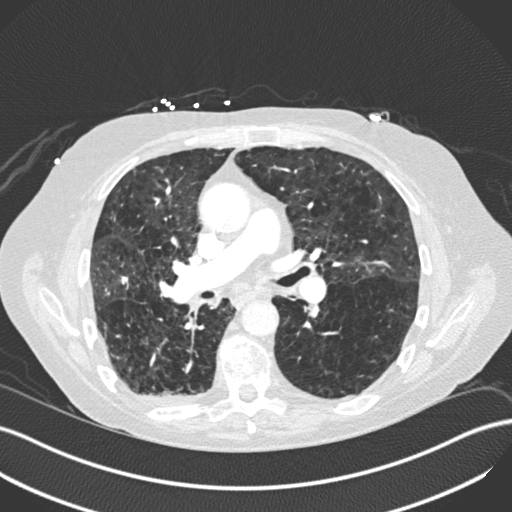
[im 201/331  soft-tissue]
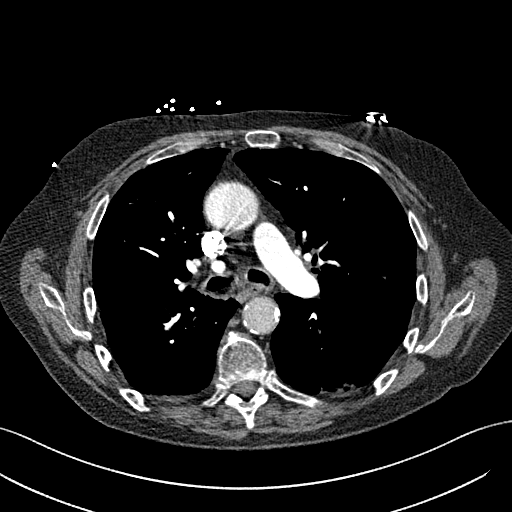
[im 230/331  lung]
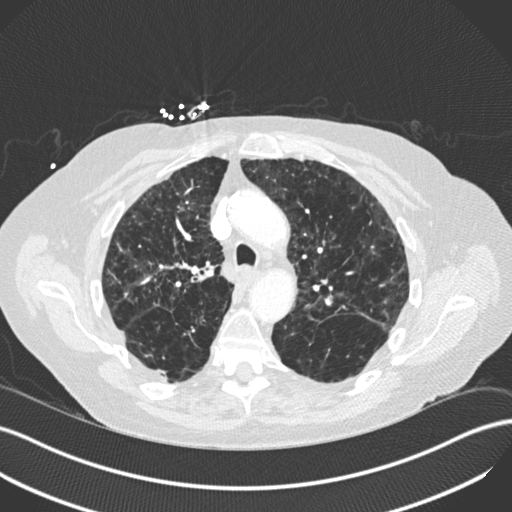
[im 244/331  soft-tissue]
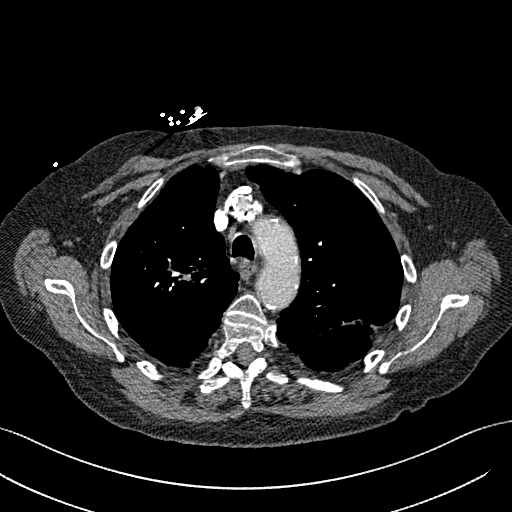
[im 273/331  lung]
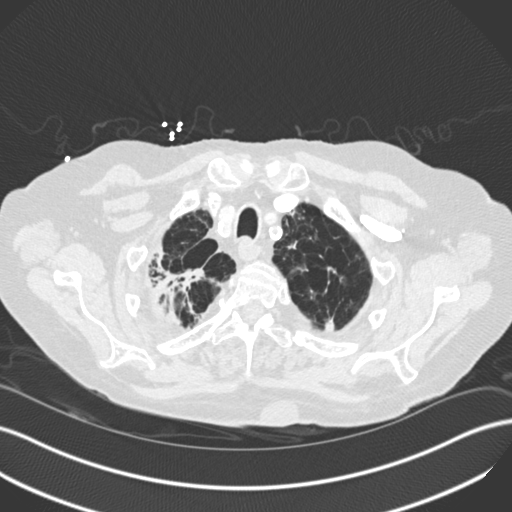
[im 287/331  soft-tissue]
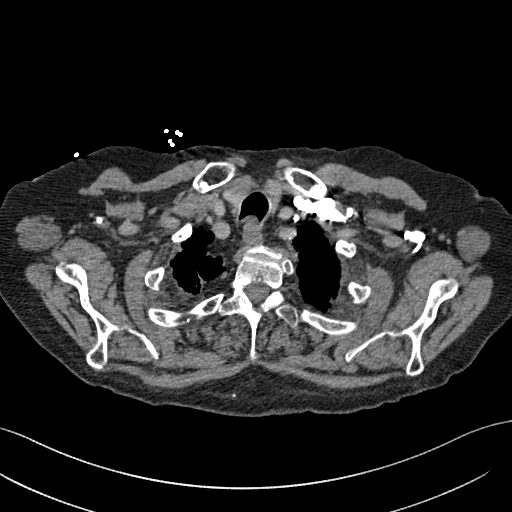
[im 316/331  lung]
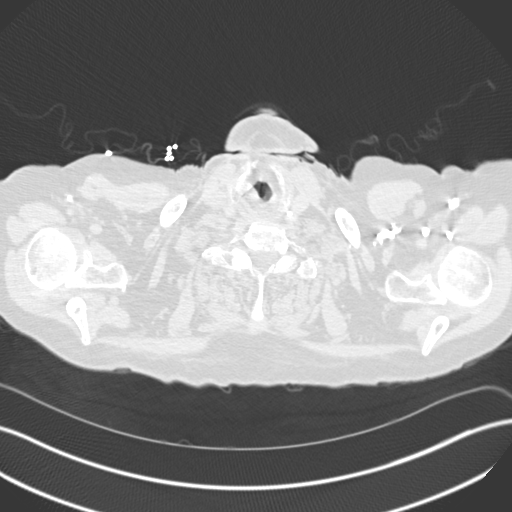

[Series 8: coronal mpr · coronal · 0.65mm/px · 3 of 151 slices shown]
[im 38/151  soft-tissue]
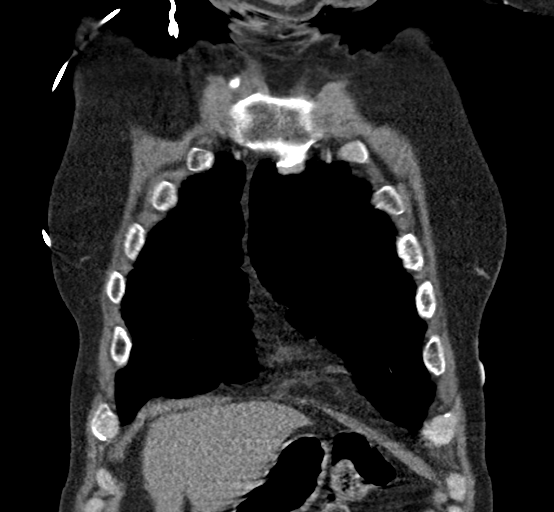
[im 76/151  soft-tissue]
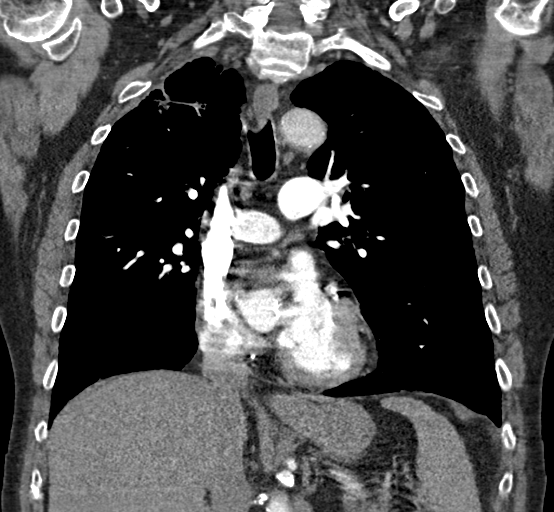
[im 113/151  soft-tissue]
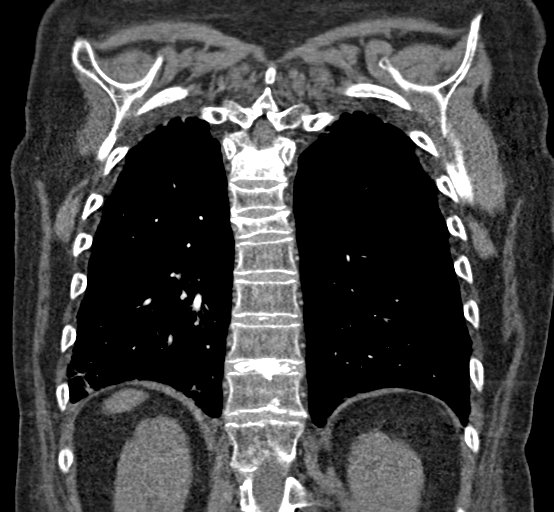

[18 of 46 positions shown; findings below may reference images not displayed]

FINDINGS: Cardiovascular: There is moderate severity calcification of the
thoracic aorta. Satisfactory opacification of the pulmonary arteries
to the segmental level. A small amount of intraluminal low
attenuation is seen within a lower lobe branch of the right
6). Pulmonary embolism is seen within this region on the chest CT a
dated [DATE]. Normal heart size. No pericardial effusion.

Mediastinum/Nodes: No enlarged mediastinal, hilar, or axillary lymph
nodes. Thyroid gland, trachea, and esophagus demonstrate no
significant findings.

Lungs/Pleura: There is marked severity emphysematous lung disease.

Mild atelectasis and/or infiltrate is seen within the posterior
aspect of the right lung base.

Mild to moderate severity linear scarring and/or atelectasis is seen
within the right apex.

There is no evidence of a pleural effusion or pneumothorax.

Upper Abdomen: No acute abnormality.

Musculoskeletal: Chronic compression deformities of the T7, T8 and
T9 vertebral bodies are seen.

Multilevel degenerative changes seen throughout the thoracic spine

Review of the MIP images confirms the above findings.
IMPRESSION: 1. Small amount of pulmonary embolism within a lower lobe branch of
the right pulmonary artery. Pulmonary embolism is seen within this
region on the prior study. As result, this may be chronic in nature.
2. Marked severity emphysematous lung disease.
3. Mild atelectasis and/or infiltrate within the posterior aspect of
the right lung base.
4. Chronic compression deformities of the T7, T8 and T9 vertebral
bodies.
5. Emphysema and aortic atherosclerosis.

Aortic Atherosclerosis ([41]-[41]) and Emphysema ([41]-[41]).

## 2019-12-15 IMAGING — CR DG CHEST 2V
3 series · 3 of 3 positions shown · non-contrast
Comparison: [DATE]

CLINICAL DATA: Chest pain and shortness of breath

EXAM:
CHEST - 2 VIEW

[chest lat]
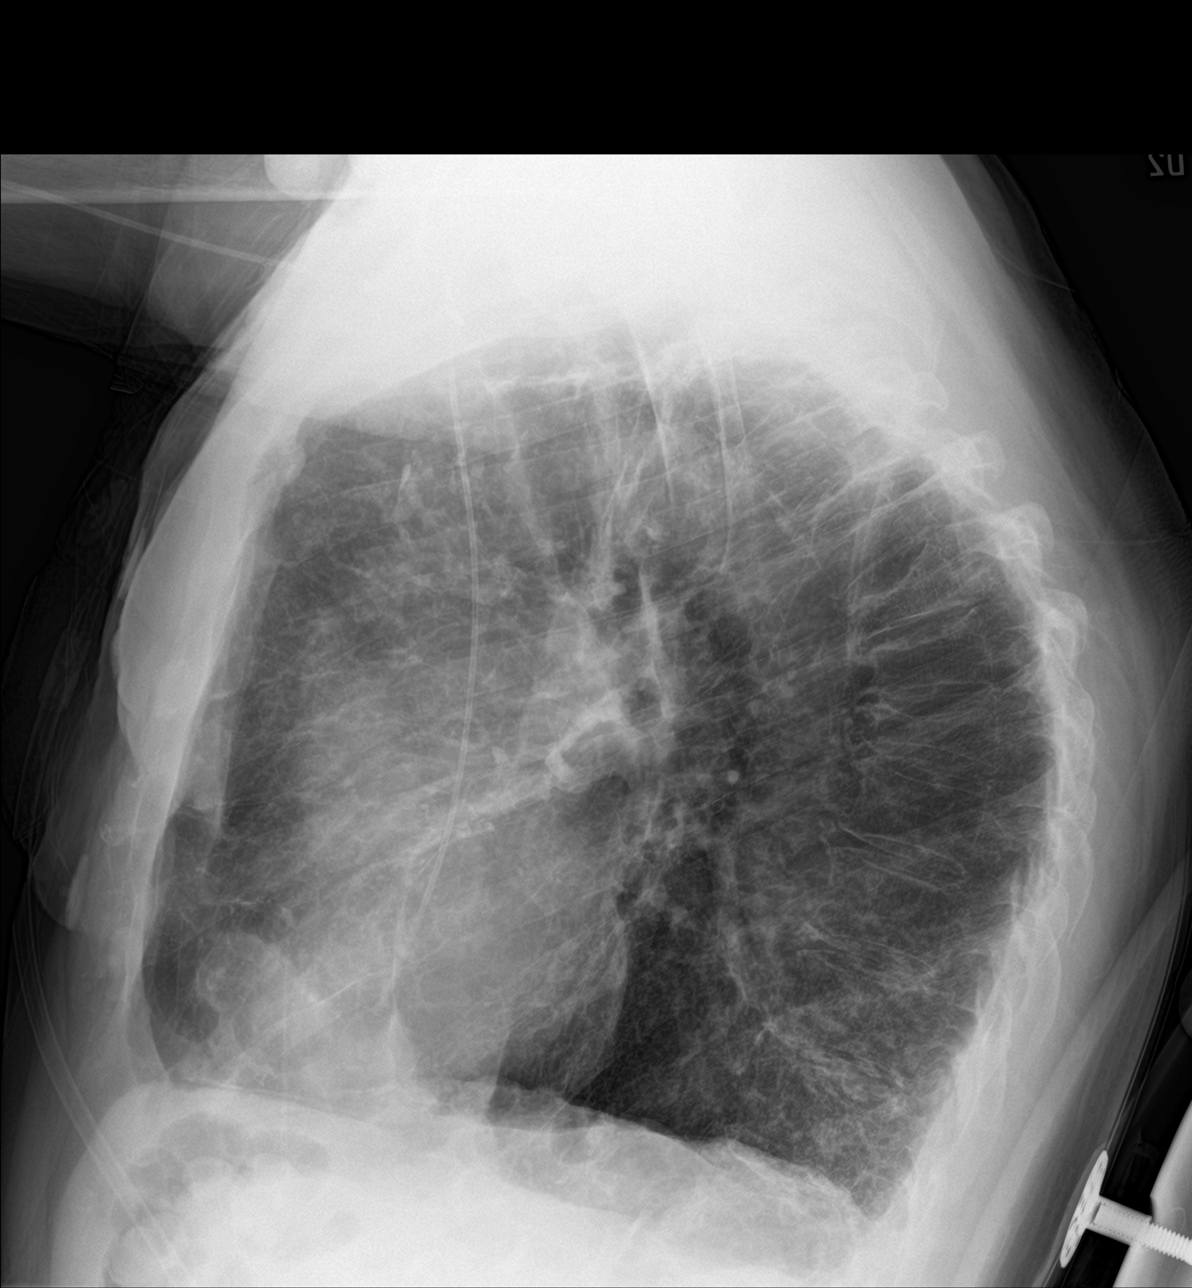

[chest ap (1 of 2)]
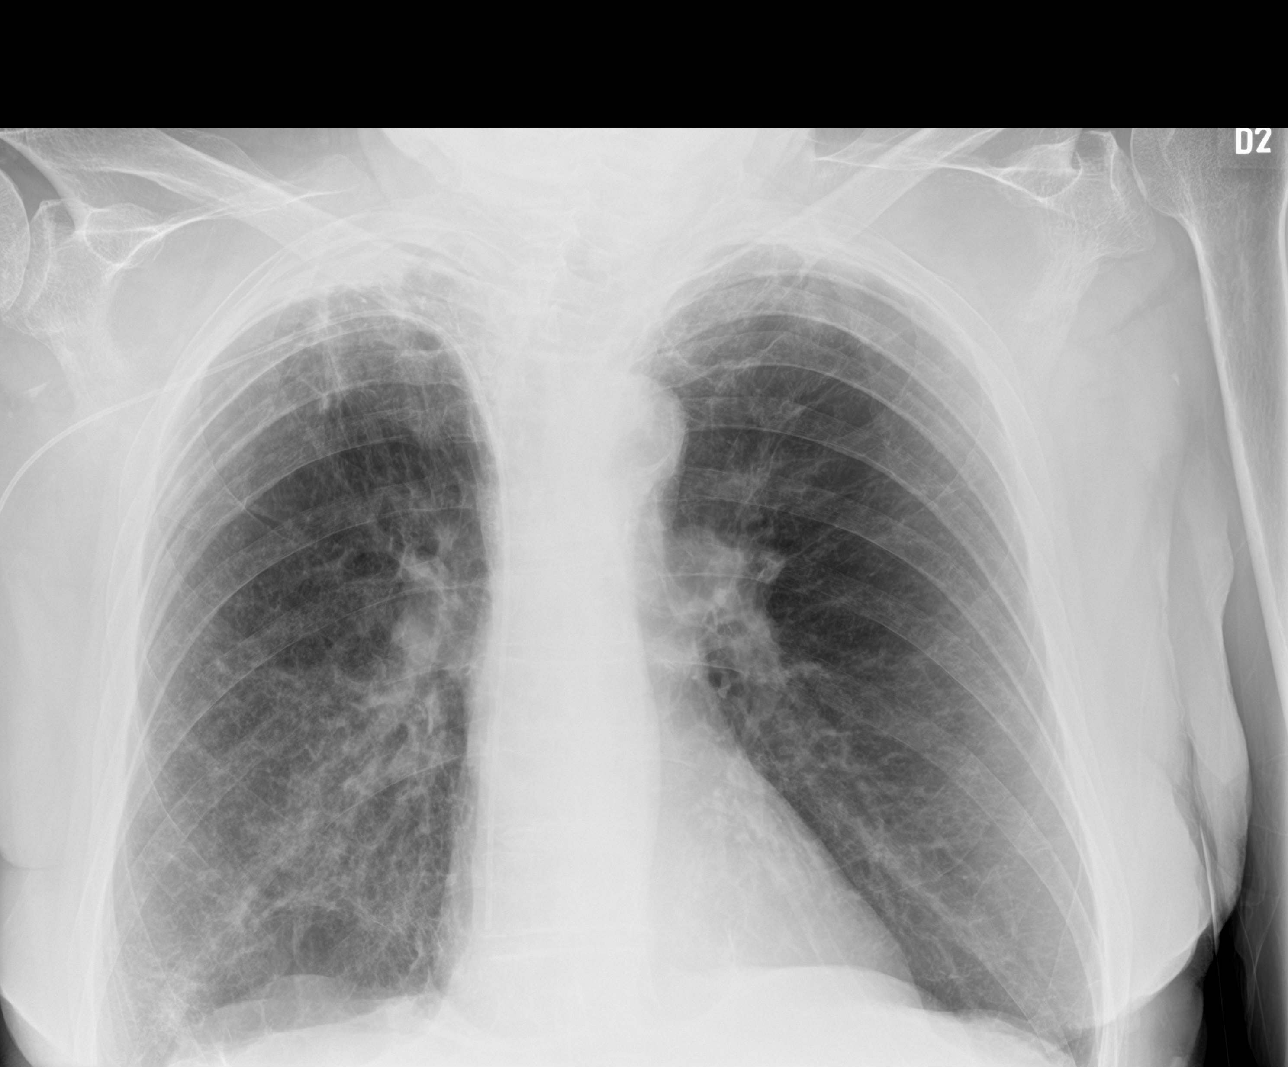

[chest ap (2 of 2)]
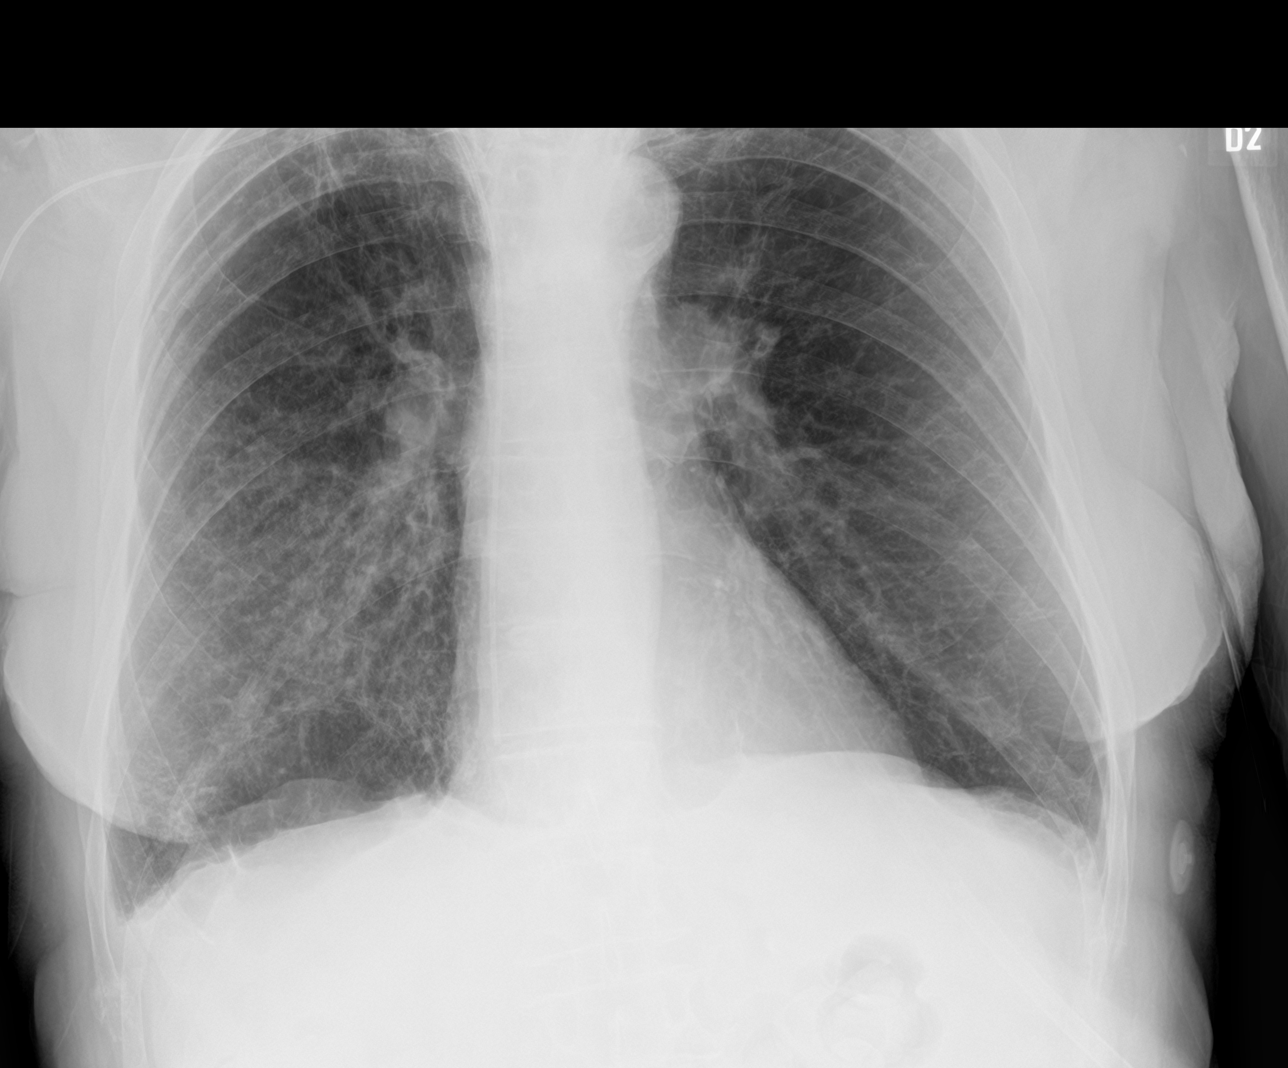

[3 of 3 positions shown; findings below may reference images not displayed]

FINDINGS: Chronic lung disease with asymmetric right apical scarring. There is
hyperinflation and apical emphysema. Greater lung markings on the
right not localized on the lateral view. Right PICC with tip at the
right atrium. Normal heart size and mediastinal contours.

Chronic midthoracic compression fractures when compared to prior.
IMPRESSION: 1. Asymmetric reticular markings on the right, question early
bronchopneumonia.
2. Advanced COPD.

## 2019-12-15 MED ORDER — METHYLPREDNISOLONE SODIUM SUCC 125 MG IJ SOLR
125.0000 mg | Freq: Once | INTRAMUSCULAR | Status: AC
  Administered 2019-12-15: 125 mg via INTRAVENOUS
  Filled 2019-12-15: qty 2

## 2019-12-15 MED ORDER — IOHEXOL 350 MG/ML SOLN
75.0000 mL | Freq: Once | INTRAVENOUS | Status: AC | PRN
  Administered 2019-12-15: 75 mL via INTRAVENOUS

## 2019-12-15 MED ORDER — PREDNISONE 10 MG PO TABS
ORAL_TABLET | ORAL | 0 refills | Status: DC
Start: 2019-12-15 — End: 2020-01-12

## 2019-12-15 MED ORDER — ALBUTEROL SULFATE HFA 108 (90 BASE) MCG/ACT IN AERS
6.0000 | INHALATION_SPRAY | Freq: Once | RESPIRATORY_TRACT | Status: DC
  Filled 2019-12-15: qty 6.7

## 2019-12-15 MED ORDER — PREDNISONE 10 MG PO TABS
ORAL_TABLET | ORAL | 0 refills | Status: DC
Start: 2019-12-15 — End: 2019-12-15

## 2019-12-15 NOTE — Telephone Encounter (Signed)
Noted.  Please route to [name] as Juluis Rainier as well.  Please also route to EP and Cherina so we can leave this open to ensure patient is scheduled for follow-up with our office closely.[name], FNP

## 2019-12-15 NOTE — ED Triage Notes (Signed)
Pt told to come to the ED by his pulmonary MD  due to continued sob , pt is on 4 liters otc , pt has a picc line and is on day 14 of antibiotics

## 2019-12-15 NOTE — Telephone Encounter (Signed)
FYI [name] 

## 2019-12-15 NOTE — ED Notes (Signed)
Transported to CT 

## 2019-12-15 NOTE — ED Provider Notes (Signed)
[facility] EMERGENCY DEPARTMENT Provider Note   CSN: [id number] Arrival date & time: 12/15/19  0867     History Chief Complaint  Patient presents with  . Shortness of Breath    Francisco [name] is a 74 y.o. male.  The history is provided by the patient.  Shortness of Breath Severity:  Mild Onset quality:  Gradual Timing:  Constant Chronicity:  Recurrent Context comment:  Severe COPD on 3L of O2 but not using 4 with exertion. Currently of cefepime IV from PICC line for pseudomonos infection. Decreased sputum production but SOB worse with ambulation over the last day or two. Relieved by:  Inhaler Worsened by:  Exertion Associated symptoms: cough and wheezing   Associated symptoms: no abdominal pain, no chest pain, no claudication, no diaphoresis, no ear pain, no fever, no rash, no sore throat, no sputum production and no vomiting   Risk factors: hx of PE/DVT        Past Medical History:  Diagnosis Date  . Chest x-ray abnormality   . Chronic back pain    "mid and lower" (04/07/2018)  . Chronic rhinitis    -Sinus Ct 08/01/2009 >> Bilateral maxillary sinusitis with some mucosal thickeningin the sphenoid and frontal sinuses as well with air fluid levels present -chronic rhinitis flyer Aug 04, 2009  . Compressed spine fracture ([place])   . COPD (chronic obstructive pulmonary disease) [facility])    PFT's rec Jul 17, 2009  . Dyspnea   . Emphysema lung ([place])   . Emphysema of lung ([place])   . On home oxygen therapy    "3L; 24/7" (04/07/2018)  . Onychomycosis    Dr. Judi Cong  . Orthostatic hypotension    "since 10/2017" (04/07/2018)  . PAD (peripheral artery disease) ([place])   . Pneumonia    "twice in 1 year" (04/07/2018)  . Pulmonary embolism ([place]) 04/07/2018  . Skin cancer    "lips, face, ears, arms" (04/07/2018)  . Vertigo    "since ~ 02/2018" (04/07/2018)    Patient Active Problem List   Diagnosis Date Noted  . Acute exacerbation of chronic bronchitis ([place])  11/30/2019  . PAD (peripheral artery disease) ([place]) 12/11/2018  . Pre-operative respiratory examination 12/10/2018  . COPD with acute exacerbation ([place]) 11/27/2018  . Stage 4 very severe COPD by GOLD classification ([place]) 11/27/2018  . DNR (do not resuscitate) 04/21/2018  . Diastolic dysfunction 61/95/0932  . Chronic pulmonary embolism ([place]) 04/07/2018  . Essential tremor 03/31/2018  . BPPV (benign paroxysmal positional vertigo), right 02/10/2018  . Osteoporosis 11/19/2017  . Syncope 11/02/2017  . Thoracic compression fracture ([place]) 11/02/2017  . Essential hypertension 11/02/2017  . Venous stasis dermatitis of both lower extremities 11/02/2017  . Chronic respiratory failure with hypoxia ([place]) 09/23/2017  . BPH associated with nocturia 06/25/2017  . Leukocytosis 12/19/2016  . Hyperglycemia 06/12/2016  . Hyperlipidemia 07/22/2014  . Onychomycosis 07/22/2014  . Left ankle swelling 07/22/2014  . History of skin cancer 05/17/2014  . Chronic rhinitis 10/28/2012  . Pulmonary nodule 09/23/2011  . Former smoker 08/23/2008  . COPD (chronic obstructive pulmonary disease) with emphysema ([place]) 08/23/2008  . History of colonic polyps 08/23/2008    Past Surgical History:  Procedure Laterality Date  . APPENDECTOMY    . CATARACT EXTRACTION, BILATERAL    . ENDARTERECTOMY FEMORAL Right 04/19/2019   Procedure: ENDARTERECTOMY RIGHT COMMON FEMORAL;  Surgeon: Elam Dutch, MD;  Location: [place];  Service: Vascular;  Laterality: Right;  . LOWER EXTREMITY ANGIOGRAPHY  12/11/2018  .  LOWER EXTREMITY ANGIOGRAPHY N/A 12/11/2018   Procedure: LOWER EXTREMITY ANGIOGRAPHY;  Surgeon: Elam Dutch, MD;  Location: [place] [facility];  Service: Cardiovascular;  Laterality: N/A;  . PATCH ANGIOPLASTY Right 04/19/2019   Procedure: Patch Angioplasty;  Surgeon: Elam Dutch, MD;  Location: [place];  Service: Vascular;  Laterality: Right;  . PERIPHERAL VASCULAR INTERVENTION Right 12/11/2018   Procedure:  PERIPHERAL VASCULAR INTERVENTION;  Surgeon: Elam Dutch, MD;  Location: [place] [facility];  Service: Cardiovascular;  Laterality: Right;  Common Iliac   . SKIN CANCER EXCISION     "lips, face, ears, arms" (04/07/2018)  . TONSILLECTOMY         Family History  Problem Relation Age of Onset  . Heart disease Mother        CABG in her 7s, nonsmoker  . Cancer Mother   . Stroke Father   . Heart disease Father        Died of MI at age 35, smoker  . Hepatitis Sister   . Coronary artery disease Other        male 1st degree relative <60    Social History   Tobacco Use  . Smoking status: Former Smoker    Packs/day: 2.00    Years: 52.00    Pack years: 104.00    Types: Pipe, Cigarettes    Start date: 10/06/1965    Quit date: 10/06/2017    Years since quitting: 2.1  . Smokeless tobacco: Never Used  . Tobacco comment: 04/07/2018 "smoked cigarettes years ago; stopped ~ 20 yr ago; stopped smoking pipe in 09/2017"  Substance Use Topics  . Alcohol use: Yes    Alcohol/week: 28.0 standard drinks    Types: 28 Cans of beer per week    Comment: "04/15/2019 "4 beers/day; probably closer to 28/wk"  . Drug use: No    Home Medications Prior to Admission medications   Medication Sig Start Date End Date Taking? Authorizing Provider  acetaminophen (TYLENOL) 500 MG tablet Take 500-1,000 mg by mouth every 6 (six) hours as needed for moderate pain.    Yes [provider]  albuterol (VENTOLIN HFA) 108 (90 Base) MCG/ACT inhaler USE 2 PUFFS EVERY 6 HOURS  AS NEEDED FOR WHEEZING Patient taking differently: Inhale 2 puffs into the lungs every 6 (six) hours as needed. USE 2 PUFFS EVERY 6 HOURS  AS NEEDED FOR WHEEZING 07/30/19  Yes Martyn Ehrich, NP  azithromycin [facility]) 250 MG tablet Take 1 tablet (250 mg total) by mouth every Monday, Wednesday, and Friday. 04/30/19  Yes Lauraine Rinne, NP  Budeson-Glycopyrrol-Formoterol (BREZTRI AEROSPHERE) 160-9-4.8 MCG/ACT AERO Inhale 2 puffs into the  lungs 2 (two) times daily. 11/19/19  Yes Lauraine Rinne, NP  budesonide-formoterol (SYMBICORT) 160-4.5 MCG/ACT inhaler Inhale 2 puffs into the lungs 2 (two) times daily. 08/30/19  Yes [name], MD  Calcium Carbonate-Vitamin D (CALCIUM 600+D PO) Take 2 tablets by mouth daily.   Yes [provider]  ceFEPIme (MAXIPIME) 2 g injection Inject 2 g into the vein every 8 (eight) hours. 11/30/19  Yes Michel Bickers, MD  cycloSPORINE (RESTASIS) 0.05 % ophthalmic emulsion Place 1 drop into both eyes 2 (two) times daily as needed (dry eyes).    Yes [provider]  fluticasone (FLONASE) 50 MCG/ACT nasal spray Place 2 sprays into both nostrils daily. 11/29/19  Yes [name], MD  furosemide (LASIX) 20 MG tablet TAKE 1 TABLET BY MOUTH  DAILY 11/29/19  Yes [name],  MD  OXYGEN Inhale 3 L into the lungs continuous.    Yes [provider]  predniSONE (DELTASONE) 10 MG tablet Take 1 tablet (10 mg total) by mouth daily with breakfast. 09/21/19  Yes Lauraine Rinne, NP  rivaroxaban (XARELTO) 20 MG TABS tablet TAKE 1 TABLET BY MOUTH  DAILY 06/02/19  Yes [name], MD  roflumilast (DALIRESP) 500 MCG TABS tablet Take 1 tablet (500 mcg total) by mouth daily. 08/02/19  Yes Brand Males, MD  sodium chloride HYPERTONIC 3 % nebulizer solution Take by nebulization daily. Patient taking differently: Take 4 mLs by nebulization daily as needed for cough.  11/11/19  Yes Brand Males, MD  SPIRIVA RESPIMAT 2.5 MCG/ACT AERS USE 2 INHALATIONS BY MOUTH  ONCE DAILY Patient taking differently: Inhale 2 puffs into the lungs daily.  03/03/19  Yes Brand Males, MD  traMADol (ULTRAM) 50 MG tablet Take 1 tablet (50 mg total) by mouth every 12 (twelve) hours as needed for moderate pain or severe pain. 11/29/19  Yes [name], MD  Budeson-Glycopyrrol-Formoterol (BREZTRI AEROSPHERE) 160-9-4.8 MCG/ACT AERO Inhale 2 puffs into the lungs in the morning and at bedtime. Patient not  taking: Reported on 12/15/2019 11/11/19   Brand Males, MD  predniSONE (DELTASONE) 10 MG tablet 30mg (3 tabs daily) for three days, then 20mg (2 tabs daily) for three days 12/15/19   Deno Etienne, DO  Respiratory Therapy Supplies (FLUTTER) DEVI 10 times Twice a day and prn as needed, may increase if feeling worse 10/15/19   Lauraine Rinne, NP    Allergies    Tape, Ciprofloxacin, and Levaquin [levofloxacin]  Review of Systems   Review of Systems  Constitutional: Negative for chills, diaphoresis and fever.  HENT: Negative for ear pain and sore throat.   Eyes: Negative for pain and visual disturbance.  Respiratory: Positive for cough, shortness of breath and wheezing. Negative for sputum production.   Cardiovascular: Positive for leg swelling (chronic). Negative for chest pain, palpitations and claudication.  Gastrointestinal: Negative for abdominal pain and vomiting.  Genitourinary: Negative for dysuria and hematuria.  Musculoskeletal: Negative for arthralgias and back pain.  Skin: Negative for color change and rash.  Neurological: Negative for seizures and syncope.  All other systems reviewed and are negative.   Physical Exam Updated Vital Signs  ED Triage Vitals [12/15/19 0932]  Enc Vitals Group     BP 112/76     Pulse Rate (!) 132     Resp 20     Temp 98.4 F (36.9 C)     Temp src      SpO2 99 %     Weight      Height      Head Circumference      Peak Flow      Pain Score      Pain Loc      Pain Edu?      Excl. in [place]?     Physical Exam Vitals and nursing note reviewed.  Constitutional:      General: He is not in acute distress.    Appearance: He is well-developed. He is not ill-appearing.  HENT:     Head: Normocephalic and atraumatic.  Eyes:     Extraocular Movements: Extraocular movements intact.     Conjunctiva/sclera: Conjunctivae normal.     Pupils: Pupils are equal, round, and reactive to light.  Cardiovascular:     Rate and Rhythm: Normal rate and regular  rhythm.     Pulses: Normal pulses.  Heart sounds: Normal heart sounds. No murmur.  Pulmonary:     Effort: Pulmonary effort is normal. No respiratory distress.     Breath sounds: Decreased breath sounds and wheezing present. No rhonchi or rales.  Abdominal:     Palpations: Abdomen is soft.     Tenderness: There is no abdominal tenderness.  Musculoskeletal:     Cervical back: Normal range of motion and neck supple.     Right lower leg: Edema (2+ pitting) present.     Left lower leg: Edema (2+ pitting) present.  Skin:    General: Skin is warm and dry.     Capillary Refill: Capillary refill takes less than 2 seconds.  Neurological:     General: No focal deficit present.     Mental Status: He is alert.  Psychiatric:        Mood and Affect: Mood normal.     ED Results / Procedures / Treatments   Labs (all labs ordered are listed, but only abnormal results are displayed) Labs Reviewed  BASIC METABOLIC PANEL - Abnormal; Notable for the following components:      Result Value   Glucose, Bld 101 (*)    Creatinine, Ser 1.37 (*)    GFR calc non Af Amer 51 (*)    GFR calc Af Amer 59 (*)    All other components within normal limits  CBC - Abnormal; Notable for the following components:   WBC 18.1 (*)    RBC 3.67 (*)    Hemoglobin 9.8 (*)    HCT 32.7 (*)    All other components within normal limits  BRAIN NATRIURETIC PEPTIDE - Abnormal; Notable for the following components:   B Natriuretic Peptide 186.4 (*)    All other components within normal limits  EXPECTORATED SPUTUM ASSESSMENT W REFEX TO RESP CULTURE  TROPONIN I (HIGH SENSITIVITY)  TROPONIN I (HIGH SENSITIVITY)    EKG EKG Interpretation  Date/Time:  Wednesday December 15 2019 09:59:21 EDT Ventricular Rate:  127 PR Interval:  168 QRS Duration: 62 QT Interval:  276 QTC Calculation: 401 R Axis:   -52 Text Interpretation: Sinus tachycardia Right atrial enlargement Left axis deviation Pulmonary disease pattern Abnormal ECG  Confirmed by Lennice Sites [phone]) on 12/15/2019 10:59:50 AM   Radiology DG Chest 2 View  Result Date: 12/15/2019 CLINICAL DATA:  Chest pain and shortness of breath EXAM: CHEST - 2 VIEW COMPARISON:  09/06/2019 FINDINGS: Chronic lung disease with asymmetric right apical scarring. There is hyperinflation and apical emphysema. Greater lung markings on the right not localized on the lateral view. Right PICC with tip at the right atrium. Normal heart size and mediastinal contours. Chronic midthoracic compression fractures when compared to prior. IMPRESSION: 1. Asymmetric reticular markings on the right, question early bronchopneumonia. 2. Advanced COPD. Electronically Signed   By: Monte Fantasia M.D.   On: 12/15/2019 10:44   CT Angio Chest PE W and/or Wo Contrast  Result Date: 12/15/2019 CLINICAL DATA:  Shortness of breath. EXAM: CT ANGIOGRAPHY CHEST WITH CONTRAST TECHNIQUE: Multidetector CT imaging of the chest was performed using the standard protocol during bolus administration of intravenous contrast. Multiplanar CT image reconstructions and MIPs were obtained to evaluate the vascular anatomy. CONTRAST:  47mL OMNIPAQUE IOHEXOL 350 MG/ML SOLN COMPARISON:  Nov 25, 2019 and April 07, 2018 FINDINGS: Cardiovascular: There is moderate severity calcification of the thoracic aorta. Satisfactory opacification of the pulmonary arteries to the segmental level. A small amount of intraluminal low attenuation is seen within a lower  lobe branch of the right pulmonary artery (axial CT images 174 through 179, CT series number 6). Pulmonary embolism is seen within this region on the chest CT a dated April 07, 2018. Normal heart size. No pericardial effusion. Mediastinum/Nodes: No enlarged mediastinal, hilar, or axillary lymph nodes. Thyroid gland, trachea, and esophagus demonstrate no significant findings. Lungs/Pleura: There is marked severity emphysematous lung disease. Mild atelectasis and/or infiltrate is seen within the  posterior aspect of the right lung base. Mild to moderate severity linear scarring and/or atelectasis is seen within the right apex. There is no evidence of a pleural effusion or pneumothorax. Upper Abdomen: No acute abnormality. Musculoskeletal: Chronic compression deformities of the T7, T8 and T9 vertebral bodies are seen. Multilevel degenerative changes seen throughout the thoracic spine Review of the MIP images confirms the above findings. IMPRESSION: 1. Small amount of pulmonary embolism within a lower lobe branch of the right pulmonary artery. Pulmonary embolism is seen within this region on the prior study. As result, this may be chronic in nature. 2. Marked severity emphysematous lung disease. 3. Mild atelectasis and/or infiltrate within the posterior aspect of the right lung base. 4. Chronic compression deformities of the T7, T8 and T9 vertebral bodies. 5. Emphysema and aortic atherosclerosis. Aortic Atherosclerosis (ICD10-I70.0) and Emphysema (ICD10-J43.9). Electronically Signed   By: Virgina Norfolk M.D.   On: 12/15/2019 15:55    Procedures Procedures (including critical care time)  Medications Ordered in ED Medications  albuterol (VENTOLIN HFA) 108 (90 Base) MCG/ACT inhaler 6 puff (0 puffs Inhalation Hold 12/15/19 1144)  methylPREDNISolone sodium succinate (SOLU-MEDROL) 125 mg/2 mL injection 125 mg (125 mg Intravenous Given 12/15/19 1212)  iohexol (OMNIPAQUE) 350 MG/ML injection 75 mL (75 mLs Intravenous Contrast Given 12/15/19 1513)    ED Course  I have reviewed the triage vital signs and the nursing notes.  Pertinent labs & imaging results that were available during my care of the patient were reviewed by me and considered in my medical decision making (see chart for details).    MDM Rules/Calculators/A&P                      Francisco [name] is a 74 year old male with history of COPD on 3 L of oxygen chronically now on 4 L as needed, PE on blood thinners who presents to the ED with  shortness of breath. Patient currently on PICC line with cefepime for Pseudomonas infection. Has been using higher than normal oxygen with ambulation over the last day or 2. Follows with pulmonology. Patient on daily steroids but no recent steroid burst. Using inhaler with some improvement. Patient arrives mildly tachycardic but otherwise normal vitals. No fever. States that sputum production seems to have improved. Chest x-ray done prior to my evaluation shows early bronchopneumonia but overall mostly chronic COPD findings. He did have a CT scan several weeks ago when he was started on antibiotics. Will repeat CT today to further evaluate for PE, ongoing infection. Overall he appears comfortable. Has some coarse breath sounds throughout. Likely baseline. Has chronic leg swelling. We will get BNP, troponin to evaluate for other source for shortness of breath. Will give IV steroids. Suspect that he might just need steroid burst for COPD exacerbation. Does not appear to be septic but will further evaluate with imaging for infectious process.  Patient with chest x-ray that is overall unremarkable.  Chronic PE.  Does not appear to be any new infectious process.  White count is 18.  Creatinine  1.37.  BNP mildly elevated at 186.  Overall he appears to maybe have mild reactive airway process on top of ongoing infectious process that appears to be stable if not improved.  Contacted pulmonology who came down to the ED to evaluate the patient.  They do recommend a steroid taper and will have patient follow-up outpatient.  Recommend that patient possibly consider taking Lasix for the next 1 to 2 days as well as he does take this as needed.  Discharged from the ED in good condition.  This chart was dictated using voice recognition software.  Despite best efforts to proofread,  errors can occur which can change the documentation meaning.     Final Clinical Impression(s) / ED Diagnoses Final diagnoses:  Pneumonia due to  Pseudomonas species, unspecified laterality, unspecified part of lung ([place])    Rx / DC Orders ED Discharge Orders         Ordered    predniSONE (DELTASONE) 10 MG tablet  Status:  Discontinued     12/15/19 1645    predniSONE (DELTASONE) 10 MG tablet     12/15/19 [place], [place], DO 12/15/19 1652

## 2019-12-15 NOTE — Consult Note (Signed)
NAME:  Francisco [name], MRN:  [id number], DOB:  Apr 27, 1946, LOS: 0 ADMISSION DATE:  12/15/2019, CONSULTATION DATE:  6/9 REFERRING MD:  Ronnald Nian, CHIEF COMPLAINT:  Dyspnsea    History of present illness   74 year old male with advanced COPD on home oxygen (4 L) followed by my partner [name] who presented to the emergency room today in the setting of a lengthy COPD exacerbation with increased fatigue, shortness of breath and chest congestion.  He has a history of frequent and recurrent exacerbations from his very severe COPD.  He has been compliant with his outpatient medical regimen which includes inhaled medicines and multiple oral medicines in order to try to prevent COPD exacerbations in addition to chronic prednisone which she frequently self titrates at home.  Several weeks ago he presented with increasing chest congestion shortness of breath and cough and he had a sputum culture which was positive for Pseudomonas.  He was intolerant of ciprofloxacin so a PICC line was placed and he was started on IV cefepime.  He has been taking that medicine for the last 12 days and has continued prednisone at standard dosing.  Yesterday he noted increasing fatigue, some shortness of breath and his O2 saturation dropped to 80% while walking on 4 L nasal cannula.  He was advised to come to the emergency room.  However, he did not come yesterday.  He canceled his pulmonary follow-up appointment which was scheduled for June 10.  This morning he decided to come to the emergency room because that was what he was instructed to do.  However, he says that he slept well last night, he woke up this morning feeling less short of breath, he has more energy, he has been walking and his appetite is better.  He was seen by the emergency room physician who has consulted [place] for consideration of whether or not he could go home.  Past Medical History  COPD PAD Chronic respiratory failure with hypoxemia Skin  cancer PE  Significant Hospital Events     Consults:  Pulmonary  Procedures:    Significant Diagnostic Tests:  6/9 CT chest > severe emphysema, likely chronic RLL pulmonary embolism, small infiltrate R base  Micro Data:  5/10 sputum culture > pan sensitive pseudomonas  Antimicrobials:  Cefepime  Interim history/subjective:    Objective   Blood pressure (!) 164/81, pulse (!) 108, temperature 98 F (36.7 C), temperature source Oral, resp. rate 16, SpO2 100 %.       No intake or output data in the 24 hours ending 12/15/19 1705 There were no vitals filed for this visit.  Examination: General:  Chronically ill appearing, resting comfortably in bed HENT: NCAT OP clear PULM: Poor air movement, mild wheezing, normal effort CV: irreg irreg, no mgr GI: BS+, soft, nontender MSK: normal bulk and tone Neuro: awake, alert, no distress, MAEW   Resolved Hospital Problem list     Assessment & Plan:  COPD exacerbation: Clinically improving but still with some persistent dyspnea, I agree with he and his wife that he may benefit from a short burst of prednisone in addition to his chronic prednisone Prednisone burst: 30 mg daily x3 days, 20 mg daily x3 days then resume home dose Resume home bronchodilators per routine Continue to stay off of azithromycin and Daliresp until complete cefepime Continue flutter valve  Chronic respiratory failure with hypoxemia Continue 4 L of oxygen continuously  Pseudomonas pneumonia versus COPD related tracheobronchitis: Complete cefepime tomorrow Continue flutter valve Continue  hypertonic saline  Pulmonary embolism, likely chronic, on Xarelto: Continue Xarelto Consider repeat echocardiogram  Follow-up in pulmonary clinic in the next 5 days, will make an attempt to obtain a sputum culture here  Best practice:    Labs   CBC: Recent Labs  Lab 12/15/19 1135  WBC 18.1*  HGB 9.8*  HCT 32.7*  MCV 89.1  PLT 818    Basic Metabolic  Panel: Recent Labs  Lab 12/15/19 1135  NA 135  K 4.0  CL 99  CO2 23  GLUCOSE 101*  BUN 21  CREATININE 1.37*  CALCIUM 9.0   GFR: CrCl cannot be calculated (Unknown ideal weight.). Recent Labs  Lab 12/15/19 1135  WBC 18.1*    Liver Function Tests: No results for input(s): AST, ALT, ALKPHOS, BILITOT, PROT, ALBUMIN in the last 168 hours. No results for input(s): LIPASE, AMYLASE in the last 168 hours. No results for input(s): AMMONIA in the last 168 hours.  ABG    Component Value Date/Time   PHART 7.423 04/15/2019 1406   PCO2ART 41.0 04/15/2019 1406   PO2ART 108 04/15/2019 1406   HCO3 26.3 04/15/2019 1406   TCO2 32 12/11/2018 0909   O2SAT 98.2 04/15/2019 1406     Coagulation Profile: No results for input(s): INR, PROTIME in the last 168 hours.  Cardiac Enzymes: No results for input(s): CKTOTAL, CKMB, CKMBINDEX, TROPONINI in the last 168 hours.  HbA1C: Hgb A1c MFr Bld  Date/Time Value Ref Range Status  06/02/2019 11:05 AM 4.9 4.6 - 6.5 % Final    Comment:    Glycemic Control Guidelines for People with Diabetes:Non Diabetic:  <6%Goal of Therapy: <7%Additional Action Suggested:  >8%     CBG: No results for input(s): GLUCAP in the last 168 hours.  Review of Systems:   Gen: Denies fever, chills, weight change, fatigue, night sweats HEENT: Denies blurred vision, double vision, hearing loss, tinnitus, sinus congestion, rhinorrhea, sore throat, neck stiffness, dysphagia PULM: per HPI CV: Denies chest pain, edema, orthopnea, paroxysmal nocturnal dyspnea, palpitations GI: Denies abdominal pain, nausea, vomiting, diarrhea, hematochezia, melena, constipation, change in bowel habits GU: Denies dysuria, hematuria, polyuria, oliguria, urethral discharge Endocrine: Denies hot or cold intolerance, polyuria, polyphagia or appetite change Derm: Denies rash, dry skin, scaling or peeling skin change Heme: Denies easy bruising, bleeding, bleeding gums Neuro: Denies headache,  numbness, weakness, slurred speech, loss of memory or consciousness   Past Medical History  He,  has a past medical history of Chest x-ray abnormality, Chronic back pain, Chronic rhinitis, Compressed spine fracture (HCC), COPD (chronic obstructive pulmonary disease) (HCC), Dyspnea, Emphysema lung ([place]), Emphysema of lung ([place]), On home oxygen therapy, Onychomycosis, Orthostatic hypotension, PAD (peripheral artery disease) ([place]), Pneumonia, Pulmonary embolism ([place]) (04/07/2018), Skin cancer, and Vertigo.   Surgical History    Past Surgical History:  Procedure Laterality Date  . APPENDECTOMY    . CATARACT EXTRACTION, BILATERAL    . ENDARTERECTOMY FEMORAL Right 04/19/2019   Procedure: ENDARTERECTOMY RIGHT COMMON FEMORAL;  Surgeon: Elam Dutch, MD;  Location: [facility];  Service: Vascular;  Laterality: Right;  . LOWER EXTREMITY ANGIOGRAPHY  12/11/2018  . LOWER EXTREMITY ANGIOGRAPHY N/A 12/11/2018   Procedure: LOWER EXTREMITY ANGIOGRAPHY;  Surgeon: Elam Dutch, MD;  Location: [place] [facility];  Service: Cardiovascular;  Laterality: N/A;  . PATCH ANGIOPLASTY Right 04/19/2019   Procedure: Patch Angioplasty;  Surgeon: Elam Dutch, MD;  Location: [place];  Service: Vascular;  Laterality: Right;  . PERIPHERAL VASCULAR INTERVENTION Right 12/11/2018   Procedure:  PERIPHERAL VASCULAR INTERVENTION;  Surgeon: Elam Dutch, MD;  Location: [place] [facility];  Service: Cardiovascular;  Laterality: Right;  Common Iliac   . SKIN CANCER EXCISION     "lips, face, ears, arms" (04/07/2018)  . TONSILLECTOMY       Social History   reports that he quit smoking about 2 years ago. His smoking use included pipe and cigarettes. He started smoking about 54 years ago. He has a 104.00 pack-year smoking history. He has never used smokeless tobacco. He reports current alcohol use of about 28.0 standard drinks of alcohol per week. He reports that he does not use drugs.   Family History   His family history  includes Cancer in his mother; Coronary artery disease in an other family member; Heart disease in his father and mother; Hepatitis in his sister; Stroke in his father.   Allergies Allergies  Allergen Reactions  . Tape Other (See Comments)    SKIN IS VERY THIN; CAN ONLY USE COBAN WRAPS DUE TO CONDITION OF SKIN!!  . Ciprofloxacin Nausea Only    Sick on stomach, weak/tired  . Levaquin [Levofloxacin]     hallucinations     Home Medications  Prior to Admission medications   Medication Sig Start Date End Date Taking? Authorizing Provider  acetaminophen (TYLENOL) 500 MG tablet Take 500-1,000 mg by mouth every 6 (six) hours as needed for moderate pain.    Yes [provider]  albuterol (VENTOLIN HFA) 108 (90 Base) MCG/ACT inhaler USE 2 PUFFS EVERY 6 HOURS  AS NEEDED FOR WHEEZING Patient taking differently: Inhale 2 puffs into the lungs every 6 (six) hours as needed. USE 2 PUFFS EVERY 6 HOURS  AS NEEDED FOR WHEEZING 07/30/19  Yes Martyn Ehrich, NP  azithromycin [facility]) 250 MG tablet Take 1 tablet (250 mg total) by mouth every Monday, Wednesday, and Friday. 04/30/19  Yes Lauraine Rinne, NP  Budeson-Glycopyrrol-Formoterol (BREZTRI AEROSPHERE) 160-9-4.8 MCG/ACT AERO Inhale 2 puffs into the lungs 2 (two) times daily. 11/19/19  Yes Lauraine Rinne, NP  budesonide-formoterol (SYMBICORT) 160-4.5 MCG/ACT inhaler Inhale 2 puffs into the lungs 2 (two) times daily. 08/30/19  Yes [name], MD  Calcium Carbonate-Vitamin D (CALCIUM 600+D PO) Take 2 tablets by mouth daily.   Yes [provider]  ceFEPIme (MAXIPIME) 2 g injection Inject 2 g into the vein every 8 (eight) hours. 11/30/19  Yes Michel Bickers, MD  cycloSPORINE (RESTASIS) 0.05 % ophthalmic emulsion Place 1 drop into both eyes 2 (two) times daily as needed (dry eyes).    Yes [provider]  fluticasone (FLONASE) 50 MCG/ACT nasal spray Place 2 sprays into both nostrils daily. 11/29/19  Yes [name], MD   furosemide (LASIX) 20 MG tablet TAKE 1 TABLET BY MOUTH  DAILY 11/29/19  Yes [name], MD  OXYGEN Inhale 3 L into the lungs continuous.    Yes [provider]  predniSONE (DELTASONE) 10 MG tablet Take 1 tablet (10 mg total) by mouth daily with breakfast. 09/21/19  Yes Lauraine Rinne, NP  rivaroxaban (XARELTO) 20 MG TABS tablet TAKE 1 TABLET BY MOUTH  DAILY 06/02/19  Yes [name], MD  roflumilast (DALIRESP) 500 MCG TABS tablet Take 1 tablet (500 mcg total) by mouth daily. 08/02/19  Yes Brand Males, MD  sodium chloride HYPERTONIC 3 % nebulizer solution Take by nebulization daily. Patient taking differently: Take 4 mLs by nebulization daily as needed for cough.  11/11/19  Yes Brand Males, MD  SPIRIVA RESPIMAT 2.5 MCG/ACT AERS USE 2 INHALATIONS BY MOUTH  ONCE DAILY Patient taking differently: Inhale 2 puffs into the lungs daily.  03/03/19  Yes Brand Males, MD  traMADol (ULTRAM) 50 MG tablet Take 1 tablet (50 mg total) by mouth every 12 (twelve) hours as needed for moderate pain or severe pain. 11/29/19  Yes [name], MD  Budeson-Glycopyrrol-Formoterol (BREZTRI AEROSPHERE) 160-9-4.8 MCG/ACT AERO Inhale 2 puffs into the lungs in the morning and at bedtime. Patient not taking: Reported on 12/15/2019 11/11/19   Brand Males, MD  predniSONE (DELTASONE) 10 MG tablet 30mg (3 tabs daily) for three days, then 20mg (2 tabs daily) for three days 12/15/19   Deno Etienne, DO  Respiratory Therapy Supplies (FLUTTER) DEVI 10 times Twice a day and prn as needed, may increase if feeling worse 10/15/19   Lauraine Rinne, NP     Critical care time: n/a     Roselie Awkward, MD [facility] Pager: [phone] Cell: [phone] If no response, call [phone]

## 2019-12-15 NOTE — Discharge Instructions (Signed)
Follow up with your doctor

## 2019-12-16 ENCOUNTER — Ambulatory Visit: Payer: Medicare Other | Admitting: Pulmonary Disease

## 2019-12-17 LAB — CULTURE, RESPIRATORY W GRAM STAIN: Culture: NORMAL

## 2019-12-17 NOTE — Telephone Encounter (Signed)
Pt was discharged from ED after 8 hours. Diagnosis was pneumonia. Pt has a f/u with Eric Form 6/14. Nothing further needed.

## 2019-12-19 ENCOUNTER — Telehealth: Payer: Self-pay | Admitting: Emergency Medicine

## 2019-12-19 NOTE — Telephone Encounter (Signed)
Post ED Visit - Positive Culture Follow-up  Culture report reviewed by antimicrobial stewardship pharmacist: [facility] Team []  Elenor Quinones, Pharm.D. []  Heide Guile, Pharm.D., BCPS AQ-ID []  Parks Neptune, Pharm.D., BCPS []  Alycia Rossetti, Pharm.D., BCPS []  [place], Pharm.D., BCPS, AAHIVP []  Legrand Como, Pharm.D., BCPS, AAHIVP [x]  Salome Arnt, PharmD, BCPS []  Johnnette Gourd, PharmD, BCPS []  Hughes Better, PharmD, BCPS []  Leeroy Cha, PharmD []  Laqueta Linden, PharmD, BCPS []  Albertina Parr, PharmD  [place] Team []  Leodis Sias, PharmD []  Lindell Spar, PharmD []  Royetta Asal, PharmD []  Graylin Shiver, Rph []  Rema Fendt) [place], PharmD []  Arlyn Dunning, PharmD []  Netta Cedars, PharmD []  Dia Sitter, PharmD []  Leone Haven, PharmD []  Gretta Arab, PharmD []  Theodis Shove, PharmD []  Peggyann Juba, PharmD []  Reuel Boom, PharmD   Respiratory culture No further patient follow-up is required at this time.  Sandi Raveling Keerat Denicola 12/19/2019, 10:35 AM

## 2019-12-20 ENCOUNTER — Encounter: Payer: Self-pay | Admitting: Acute Care

## 2019-12-20 ENCOUNTER — Other Ambulatory Visit: Payer: Self-pay

## 2019-12-20 ENCOUNTER — Ambulatory Visit (INDEPENDENT_AMBULATORY_CARE_PROVIDER_SITE_OTHER): Payer: Medicare Other | Admitting: Acute Care

## 2019-12-20 VITALS — BP 142/84 | HR 100 | Temp 98.4°F | Ht 72.0 in | Wt 174.0 lb

## 2019-12-20 DIAGNOSIS — I2699 Other pulmonary embolism without acute cor pulmonale: Secondary | ICD-10-CM | POA: Diagnosis not present

## 2019-12-20 DIAGNOSIS — J441 Chronic obstructive pulmonary disease with (acute) exacerbation: Secondary | ICD-10-CM

## 2019-12-20 DIAGNOSIS — J151 Pneumonia due to Pseudomonas: Secondary | ICD-10-CM | POA: Diagnosis not present

## 2019-12-20 DIAGNOSIS — J9611 Chronic respiratory failure with hypoxia: Secondary | ICD-10-CM

## 2019-12-20 DIAGNOSIS — J449 Chronic obstructive pulmonary disease, unspecified: Secondary | ICD-10-CM | POA: Diagnosis not present

## 2019-12-20 NOTE — Patient Instructions (Addendum)
It is good to see you today. Finish current prednisone taper Then resume your 10 mg daily. If you notice a change in your breathing on the 10 mg daily, call the office for an extended prednisone taper.  Continue your Symbicort and Spiriva as you have been doing . Rinse mouth after use Once you have used up your Symbicort and Spiriva, then start the [facility]. This will be 2 puffs twice daily. Continue using hypertonic nebs with Flutter valve to help thin and mobilize secretions.  Sputum for culture, AFB and Fungal We will give you a specimen cup to take home. Collect specimen, place in refrigerator and get to [facility] office within 4 hours of collection.  Continue wearing your oxygen as you have been doing.  Saturation goals are 88-92% Continue Xaralto without fail. Bleeding precautions.  Elevate feet to help with chronic edema.  Follow up OV in 2 weeks with a CXR. Follow up with Judson Roch NP. Call if you need [place] sooner, as soon as you notice any change in your condition.  Please contact office for sooner follow up if symptoms do not improve or worsen or seek emergency care

## 2019-12-20 NOTE — Progress Notes (Signed)
History of Present Illness Francisco [name] is a 74 y.o. male former smoker ( Quit 2019 with a 104 pack year smoking history) ) with severe COPD, on home oxygen.He has history of PE and is on Eliquis.  He is followed by [name].  Synopsis Pt with recent recurrent bronchitis with sputum cultures 11/19/2019  positive for Pseudomonas aeruginosa  . He developed hallucinations after taking 1 dose of levofloxacin.  He was prescribed ciprofloxacin on 11/19/2019.  He developed severe nausea after each of his first 3 doses.  He also became very lethargic and developed right hip pain.  He stopped the ciprofloxacin and the acute symptoms resolved over the next 36 hours.  He did not have any improvement in his respiratory status therefore [name] recommended a course of IV cefepime. He is followed by [name] in [place] for the IV infusions. He completed dosing 12/17/2019 , and PICC line was discontinued.    12/20/2019 Urgent ED  Follow up.   Pt. Went to the ED   12/15/2019 for a COPD exacerbation. He had been on IV cefepime  via PICC line for Pseudomonas infection , as he was unable to take po medication,   but was feeling no better after 12 days of therapy. He experienced weakness and increasing shortness of breath with saturations that dropped to 80% on 4 L [place]. This was though to be a COPD exacerbation. [name] KitchenHe presented to the ED 12/15/2019. He was treated with a steroid injection and a started on a prednisone taper.He continued his  Antibiotic therapy. He was started on hypertonic saline nebs and flutter valve. He was discharged from the ED in fair condition  with follow up in pulmonary office within 5 days.  Pt. States he has started feeling better on the  Prednisone taper. He still has 2 days of 20 mg before he returns to his 10 mg daily dose. [name] Kitchen Per his wife his oxygen level has been stable at home at rest, and does drop with exertion. They do titrate oxygen up with exertion.[name] Kitchen His baseline oxygen use is 3  L at rest and 4 Liters with exertion. He does still have a productive cough. Secretions are yellow  green in color. They are thick.He states his cough got better with the prednisone. He states he is using a flutter valve.This works well for him.  He is not using Mucinex as he states it makes him sick. He states he is not using his hypertonic saline. He states it does not make him feel better, so he had stopped it. I explained that the hypertonic nebs are not going to make him feel like bronchodilators , but that they are thinning secretions, and I want him to use them to help cough up secretions. He states his appetite is good. His weakness has improved although he is still in a wheel chair today. He is coughing today, but states today's cough is much better than it has been.  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  He has resumed his azithromycin 6/14/2021and Daliresp 12/18/2019. He denies any fever, chest pain, orthopnea or hemoptysis.     Test Results:   12/15/2019:Sputum Culture ( Per patient this was not a deep specimen) Normal Flora  5/10 sputum culture > pan sensitive pseudomonas  12/15/2019:CTA 6/9 CT chest > severe emphysema, likely chronic RLL pulmonary embolism, small infiltrate R base  CBC Latest  Ref Rng & Units 12/15/2019 11/29/2019 06/02/2019  WBC 4.0 - 10.5 K/uL 18.1(H) 12.1(H) 10.1  Hemoglobin 13.0 - 17.0 g/dL 9.8(L) 10.4(L) 12.2(L)  Hematocrit 39 - 52 % 32.7(L) 32.4(L) 36.8(L)  Platelets 150 - 400 K/uL 187 321.0 311.0    BMP Latest Ref Rng & Units 12/15/2019 11/29/2019 06/02/2019  Glucose 70 - 99 mg/dL 101(H) 99 111(H)  BUN 8 - 23 mg/dL '21 18 10  ' Creatinine 0.61 - 1.24 mg/dL 1.37(H) 1.11 0.91  Sodium 135 - 145 mmol/L 135 140 138  Potassium 3.5 - 5.1 mmol/L 4.0 4.8 4.8  Chloride 98 - 111 mmol/L 99 97 97  CO2 22 - 32 mmol/L 23 32 33(H)  Calcium 8.9 - 10.3 mg/dL 9.0 10.1 9.9    BNP    Component  Value Date/Time   BNP 186.4 (H) 12/15/2019 1136    ProBNP    Component Value Date/Time   PROBNP 50.0 04/03/2018 1148    PFT No results found for: FEV1PRE, FEV1POST, FVCPRE, FVCPOST, TLC, DLCOUNC, PREFEV1FVCRT, PSTFEV1FVCRT  DG Chest 2 View  Result Date: 12/15/2019 CLINICAL DATA:  Chest pain and shortness of breath EXAM: CHEST - 2 VIEW COMPARISON:  09/06/2019 FINDINGS: Chronic lung disease with asymmetric right apical scarring. There is hyperinflation and apical emphysema. Greater lung markings on the right not localized on the lateral view. Right PICC with tip at the right atrium. Normal heart size and mediastinal contours. Chronic midthoracic compression fractures when compared to prior. IMPRESSION: 1. Asymmetric reticular markings on the right, question early bronchopneumonia. 2. Advanced COPD. Electronically Signed   By: Monte Fantasia M.D.   On: 12/15/2019 10:44   CT Chest Wo Contrast  Result Date: 11/25/2019 CLINICAL DATA:  COPD exacerbation. Severe shortness of breath. EXAM: CT CHEST WITHOUT CONTRAST TECHNIQUE: Multidetector CT imaging of the chest was performed following the standard protocol without IV contrast. COMPARISON:  04/07/2018. FINDINGS: Cardiovascular: The heart size is normal. There is no pericardial effusion. Aortic atherosclerosis. Three vessel coronary artery atherosclerotic calcifications identified Mediastinum/Nodes: No enlarged mediastinal or axillary lymph nodes. Thyroid gland, trachea, and esophagus demonstrate no significant findings. Lungs/Pleura: No pleural effusion identified. Advanced changes of centrilobular and paraseptal emphysema with mild bullous changes. Progressive confluent fibrotic with extensive architectural distortion and volume loss no acute airspace consolidation, atelectasis or pneumothorax. Scattered, bilateral areas of pleuroparenchymal scarring identified changes within the apical portions of the right upper lobe are again noted. Upper Abdomen:  Bilateral low-attenuation adrenal nodules appears similar to previous exam and are compatible with benign adenomas. No acute abnormality identified within the imaged portions of the upper abdomen. Musculoskeletal: Mild scoliosis and degenerative disc disease noted within the thoracic spine. Stable chronic compression deformities at T6, T7 and T8. No new compression fractures. IMPRESSION: 1. No acute cardiopulmonary abnormalities. 2. Progressive confluent fibrotic changes within the right upper lobe with extensive architectural distortion and volume loss. Likely the sequelae of prior inflammation/infection. 3. Aortic atherosclerosis, in addition to 3 vessel coronary artery disease. Please note that although the presence of coronary artery calcium documents the presence of coronary artery disease, the severity of this disease and any potential stenosis cannot be assessed on this non-gated CT examination. Assessment for potential risk factor modification, dietary therapy or pharmacologic therapy may be warranted, if clinically indicated. 4. Bilateral adrenal adenomas. 5. Stable chronic compression deformities at T6, T7 and T8. 6. Diffuse bronchial wall thickening with emphysema, as above; imaging findings suggestive of underlying COPD. Aortic Atherosclerosis (ICD10-I70.0) and Emphysema (ICD10-J43.9). Electronically Signed   By:  Kerby Moors M.D.   On: 11/25/2019 12:21   CT Angio Chest PE W and/or Wo Contrast  Result Date: 12/15/2019 CLINICAL DATA:  Shortness of breath. EXAM: CT ANGIOGRAPHY CHEST WITH CONTRAST TECHNIQUE: Multidetector CT imaging of the chest was performed using the standard protocol during bolus administration of intravenous contrast. Multiplanar CT image reconstructions and MIPs were obtained to evaluate the vascular anatomy. CONTRAST:  61m OMNIPAQUE IOHEXOL 350 MG/ML SOLN COMPARISON:  Nov 25, 2019 and April 07, 2018 FINDINGS: Cardiovascular: There is moderate severity calcification of the  thoracic aorta. Satisfactory opacification of the pulmonary arteries to the segmental level. A small amount of intraluminal low attenuation is seen within a lower lobe branch of the right pulmonary artery (axial CT images 174 through 179, CT series number 6). Pulmonary embolism is seen within this region on the chest CT a dated April 07, 2018. Normal heart size. No pericardial effusion. Mediastinum/Nodes: No enlarged mediastinal, hilar, or axillary lymph nodes. Thyroid gland, trachea, and esophagus demonstrate no significant findings. Lungs/Pleura: There is marked severity emphysematous lung disease. Mild atelectasis and/or infiltrate is seen within the posterior aspect of the right lung base. Mild to moderate severity linear scarring and/or atelectasis is seen within the right apex. There is no evidence of a pleural effusion or pneumothorax. Upper Abdomen: No acute abnormality. Musculoskeletal: Chronic compression deformities of the T7, T8 and T9 vertebral bodies are seen. Multilevel degenerative changes seen throughout the thoracic spine Review of the MIP images confirms the above findings. IMPRESSION: 1. Small amount of pulmonary embolism within a lower lobe branch of the right pulmonary artery. Pulmonary embolism is seen within this region on the prior study. As result, this may be chronic in nature. 2. Marked severity emphysematous lung disease. 3. Mild atelectasis and/or infiltrate within the posterior aspect of the right lung base. 4. Chronic compression deformities of the T7, T8 and T9 vertebral bodies. 5. Emphysema and aortic atherosclerosis. Aortic Atherosclerosis (ICD10-I70.0) and Emphysema (ICD10-J43.9). Electronically Signed   By: TVirgina NorfolkM.D.   On: 12/15/2019 15:55   IR PICC PLACEMENT RIGHT >5 YRS INC IMG GUIDE  Result Date: 12/03/2019 INDICATION: Patient history of chronic bronchitis presents for PICC line placement for ongoing antibiotic access EXAM: ULTRASOUND AND FLUOROSCOPIC GUIDED  PICC LINE INSERTION MEDICATIONS: Lidocaine 1% 2 mL CONTRAST:  None FLUOROSCOPY TIME:  1 seconds (1 mGy) COMPLICATIONS: None immediate. TECHNIQUE: The procedure, risks, benefits, and alternatives were explained to the patient and informed written consent was obtained. The right upper extremity was prepped with chlorhexidine in a sterile fashion, and a sterile drape was applied covering the operative field. Maximum barrier sterile technique with sterile gowns and gloves were used for the procedure. A timeout was performed prior to the initiation of the procedure. Local anesthesia was provided with 1% lidocaine. After the overlying soft tissues were anesthetized and a micropuncture kit was utilized to access the right basilic vein. Real-time ultrasound guidance was utilized for vascular access including the acquisition of a permanent ultrasound image documenting patency of the accessed vessel. A guidewire was advanced to the level of the superior caval-atrial junction for measurement purposes and the PICC line was cut to length. A peel-away sheath was placed and a cm, 5 [place] dual lumen was inserted to level of the superior caval-atrial junction. A post procedure spot fluoroscopic was obtained. The catheter easily aspirated and flushed and was secured in place. A dressing was placed. The patient tolerated the procedure well without immediate post procedural complication. FINDINGS: After  catheter placement, the tip lies within the superior cavoatrial junction. The catheter aspirates and flushes normally and is ready for immediate use. IMPRESSION: Successful ultrasound and fluoroscopic guided placement of a right basilic vein approach, 46 cm, 5 French, single lumen PICC with tip at the superior caval-atrial junction. The PICC line is ready for immediate use. Read by: Rushie Nyhan, NP Electronically Signed   By: Aletta Edouard M.D.   On: 12/03/2019 13:33     Past medical hx Past Medical History:  Diagnosis  Date  . Chest x-ray abnormality   . Chronic back pain    "mid and lower" (04/07/2018)  . Chronic rhinitis    -Sinus Ct 08/01/2009 >> Bilateral maxillary sinusitis with some mucosal thickeningin the sphenoid and frontal sinuses as well with air fluid levels present -chronic rhinitis flyer Aug 04, 2009  . Compressed spine fracture ([place])   . COPD (chronic obstructive pulmonary disease) [facility])    PFT's rec Jul 17, 2009  . Dyspnea   . Emphysema lung ([place])   . Emphysema of lung ([place])   . On home oxygen therapy    "3L; 24/7" (04/07/2018)  . Onychomycosis    Dr. Judi Cong  . Orthostatic hypotension    "since 10/2017" (04/07/2018)  . PAD (peripheral artery disease) ([place])   . Pneumonia    "twice in 1 year" (04/07/2018)  . Pulmonary embolism ([place]) 04/07/2018  . Skin cancer    "lips, face, ears, arms" (04/07/2018)  . Vertigo    "since ~ 02/2018" (04/07/2018)     Social History   Tobacco Use  . Smoking status: Former Smoker    Packs/day: 2.00    Years: 52.00    Pack years: 104.00    Types: Pipe, Cigarettes    Start date: 10/06/1965    Quit date: 10/06/2017    Years since quitting: 2.2  . Smokeless tobacco: Never Used  . Tobacco comment: 04/07/2018 "smoked cigarettes years ago; stopped ~ 20 yr ago; stopped smoking pipe in 09/2017"  Vaping Use  . Vaping Use: Never used  Substance Use Topics  . Alcohol use: Yes    Alcohol/week: 28.0 standard drinks    Types: 28 Cans of beer per week    Comment: "04/15/2019 "4 beers/day; probably closer to 28/wk"  . Drug use: No    Mr.Shimmin reports that he quit smoking about 2 years ago. His smoking use included pipe and cigarettes. He started smoking about 54 years ago. He has a 104.00 pack-year smoking history. He has never used smokeless tobacco. He reports current alcohol use of about 28.0 standard drinks of alcohol per week. He reports that he does not use drugs.  Tobacco Cessation: Tobacco abuse  Quit 2019 with a 104 pack smoking history.  Past surgical  hx, Family hx, Social hx all reviewed.  Current Outpatient Medications on File Prior to Visit  Medication Sig  . acetaminophen (TYLENOL) 500 MG tablet Take 500-1,000 mg by mouth every 6 (six) hours as needed for moderate pain.   [name] Kitchen albuterol (VENTOLIN HFA) 108 (90 Base) MCG/ACT inhaler USE 2 PUFFS EVERY 6 HOURS  AS NEEDED FOR WHEEZING (Patient taking differently: Inhale 2 puffs into the lungs every 6 (six) hours as needed. USE 2 PUFFS EVERY 6 HOURS  AS NEEDED FOR WHEEZING)  . azithromycin (ZITHROMAX) 250 MG tablet Take 1 tablet (250 mg total) by mouth every Monday, Wednesday, and Friday.  . Budeson-Glycopyrrol-Formoterol (BREZTRI AEROSPHERE) 160-9-4.8 MCG/ACT AERO Inhale 2 puffs into the lungs in the morning and  at bedtime.  . Budeson-Glycopyrrol-Formoterol (BREZTRI AEROSPHERE) 160-9-4.8 MCG/ACT AERO Inhale 2 puffs into the lungs 2 (two) times daily.  . budesonide-formoterol (SYMBICORT) 160-4.5 MCG/ACT inhaler Inhale 2 puffs into the lungs 2 (two) times daily.  . Calcium Carbonate-Vitamin D (CALCIUM 600+D PO) Take 2 tablets by mouth daily.  [name] Kitchen ceFEPIme (MAXIPIME) 2 g injection Inject 2 g into the vein every 8 (eight) hours.  . cycloSPORINE (RESTASIS) 0.05 % ophthalmic emulsion Place 1 drop into both eyes 2 (two) times daily as needed (dry eyes).   . fluticasone (FLONASE) 50 MCG/ACT nasal spray Place 2 sprays into both nostrils daily.  . furosemide (LASIX) 20 MG tablet TAKE 1 TABLET BY MOUTH  DAILY  . OXYGEN Inhale 3 L into the lungs continuous.   . predniSONE (DELTASONE) 10 MG tablet Take 1 tablet (10 mg total) by mouth daily with breakfast.  . predniSONE (DELTASONE) 10 MG tablet 100m(3 tabs daily) for three days, then 233m2 tabs daily) for three days  . Respiratory Therapy Supplies (FLUTTER) DEVI 10 times Twice a day and prn as needed, may increase if feeling worse  . rivaroxaban (XARELTO) 20 MG TABS tablet TAKE 1 TABLET BY MOUTH  DAILY  . roflumilast (DALIRESP) 500 MCG TABS tablet Take 1 tablet  (500 mcg total) by mouth daily.  . sodium chloride HYPERTONIC 3 % nebulizer solution Take by nebulization daily. (Patient taking differently: Take 4 mLs by nebulization daily as needed for cough. )  . SPIRIVA RESPIMAT 2.5 MCG/ACT AERS USE 2 INHALATIONS BY MOUTH  ONCE DAILY (Patient taking differently: Inhale 2 puffs into the lungs daily. )  . traMADol (ULTRAM) 50 MG tablet Take 1 tablet (50 mg total) by mouth every 12 (twelve) hours as needed for moderate pain or severe pain.   No current facility-administered medications on file prior to visit.     Allergies  Allergen Reactions  . Tape Other (See Comments)    SKIN IS VERY THIN; CAN ONLY USE COBAN WRAPS DUE TO CONDITION OF SKIN!!  . Ciprofloxacin Nausea Only    Sick on stomach, weak/tired  . Levaquin [Levofloxacin]     hallucinations    Review Of Systems:  Constitutional:   No  weight loss, night sweats,  Fevers, chills , + fatigue, or  lassitude.  HEENT:   No headaches,  Difficulty swallowing,  Tooth/dental problems, or  Sore throat,                No sneezing, itching, ear ache, nasal congestion, post nasal drip,   CV:  No chest pain,  Orthopnea, PND, + baseline swelling in lower extremities, No anasarca, dizziness, palpitations, syncope.   GI  No heartburn, indigestion, abdominal pain, nausea, vomiting, diarrhea, change in bowel habits, loss of appetite, bloody stools.   Resp: + shortness of breath with exertion less  at rest.  + excess mucus, + productive cough,  No non-productive cough,  No coughing up of blood.  + change in color of mucus.  + wheezing.  No chest wall deformity  Skin: no rash or lesions.  GU: no dysuria, change in color of urine, no urgency or frequency.  No flank pain, no hematuria   MS:  No joint pain or swelling.  No decreased range of motion.  No back pain.  Psych:  No change in mood or affect. No depression or anxiety.  No memory loss.   Vital Signs BP (!) 142/84 (BP Location: Left Arm, Cuff Size:  Normal)   Pulse 100  Temp 98.4 F (36.9 C) (Oral)   Ht 6' (1.829 m)   Wt 174 lb (78.9 kg)   SpO2 94%   BMI 23.60 kg/m    Physical Exam:  General- No distress,  A&Ox3, pleasant elderly male in a wheelchair ENT: No sinus tenderness, TM clear, pale nasal mucosa, no oral exudate,no post nasal drip, no LAN Cardiac: S1, S2, regular rate and rhythm, no murmur Chest: No wheeze/ rales/ dullness; mild accessory muscle use with conversation, no nasal flaring, no sternal retractions, rhonchi throughout, distant breath sounds Abd.: Soft Non-tender, ND, BS +, Body mass index is 23.6 kg/m. Ext: No clubbing cyanosis, 1+ chronic edema Neuro:  Deconditioned, MAE x 4, A&O x 3 Skin: No rashes, No lesions, warm and dry Psych: normal mood and behavior   Assessment/Plan  COPD exacerbation:  Clinically improving but still with some persistent dyspnea. He has improved significantly with prednisone taper Resumed Daliresp 6/12 Resumed Azithro 6/14 Plan Finish current prednisone taper, then resume 10 mg daily as you have been doing Resume home bronchodilators per routine Continue flutter valve 4 puffs twice daily Resume hypertonic nebs once daily to help thin secretions so you can cough them up.   Chronic respiratory failure with hypoxemia Plan Continue 4 L of oxygen continuously Remember sat goal is 88-92%  Pseudomonas pneumonia versus COPD related tracheobronchitis: Cefepime dosing has been completed  Repeat Culture 6/9 shows normal flora, but patient states it was not a deep sample Plan Repeat Sputum Cx , AFB, fungal Continue flutter valve Continue hypertonic saline  Pulmonary embolism, likely chronic, on Xarelto: Plan Continue Xarelto Bleeding precautions Will order a repeat Echo at follow up in 2 weeks  Follow up with Judson Roch NP in 2 weeks with CXR We will also order a follow up echo at this time   This appointment was 40 min long with over 50% of the time in direct  face-to-face patient care, assessment, plan of care, and follow-up.     Magdalen Spatz, NP 12/20/2019  2:42 PM

## 2019-12-21 ENCOUNTER — Other Ambulatory Visit: Payer: Medicare Other

## 2019-12-21 ENCOUNTER — Telehealth: Payer: Self-pay

## 2019-12-21 ENCOUNTER — Telehealth: Payer: Self-pay | Admitting: Hospice

## 2019-12-21 DIAGNOSIS — J151 Pneumonia due to Pseudomonas: Secondary | ICD-10-CM

## 2019-12-21 NOTE — Telephone Encounter (Signed)
Spoke with wife, Katy Apo and told her that we needed to reschedule the 12/23/19 Palliative f/u visit for NP due to family emergency.  Wife said that she was at another appointment and she would talk with her husband when she gets home about rescheduling visit.  Wife said that they prefer in-home visits instead of telephone and she said that NP had scheduled the f/u visit as a telephone f/u.  I explained to her that I could schedule it as an in-home visit and let the NP know that they prefer in-home visits over telehealth.  Wife will discuss this with patient and call me back

## 2019-12-21 NOTE — Telephone Encounter (Signed)
Received a return call from patient regarding rescheduling a Palliative care visit. Patient stated that he does not feel that he needs Palliative care. Will update Palliative team and sign off.

## 2020-01-05 ENCOUNTER — Ambulatory Visit (INDEPENDENT_AMBULATORY_CARE_PROVIDER_SITE_OTHER): Payer: Medicare Other | Admitting: Pulmonary Disease

## 2020-01-05 ENCOUNTER — Encounter: Payer: Self-pay | Admitting: Pulmonary Disease

## 2020-01-05 ENCOUNTER — Other Ambulatory Visit: Payer: Self-pay

## 2020-01-05 ENCOUNTER — Telehealth: Payer: Self-pay | Admitting: Pulmonary Disease

## 2020-01-05 DIAGNOSIS — J9611 Chronic respiratory failure with hypoxia: Secondary | ICD-10-CM

## 2020-01-05 DIAGNOSIS — J441 Chronic obstructive pulmonary disease with (acute) exacerbation: Secondary | ICD-10-CM | POA: Diagnosis not present

## 2020-01-05 DIAGNOSIS — J449 Chronic obstructive pulmonary disease, unspecified: Secondary | ICD-10-CM

## 2020-01-05 MED ORDER — PREDNISONE 10 MG PO TABS: ORAL_TABLET | ORAL | 0 refills | Status: AC

## 2020-01-05 MED ORDER — CEFDINIR 300 MG PO CAPS: 300.0000 mg | ORAL_CAPSULE | Freq: Two times a day (BID) | ORAL | 0 refills | Status: DC

## 2020-01-05 NOTE — Progress Notes (Signed)
Virtual Visit via Telephone Note  I connected with Francisco [name] on 01/05/20 at  9:30 AM EDT by telephone and verified that I am speaking with the correct person using two identifiers.  Location: Patient: Home Provider: [facility] - [address], [place], [place], [place] [address]   I discussed the limitations, risks, security and privacy concerns of performing an evaluation and management service by telephone and the availability of in person appointments. I also discussed with the patient that there may be a patient responsible charge related to this service. The patient expressed understanding and agreed to proceed.  Patient consented to consult via telephone: Yes People present and their role in pt care: Pt     History of Present Illness:  74 year old male former smoker followed in our office for COPD  PMH: Hypertension, osteoporosis, chronic PE Smoker/ Smoking History: Former smoker.  Quit smoking in 2019.  104-pack-year smoking history. Maintenance: Symbicort 160, Spiriva Respimat 2.5, 10 mg prednisone daily, daliresp, Azithro MWF  Pt of: [name]   Chief complaint: 10 weeks   74 year old male former smoker followed in our office for very severe COPD.  Patient was last seen by [name] in May/2021.  At that recommendation he was started on IV antibiotic therapy for 2 weeks.  Pharmacy consult with Amber Yopp to set up home infusion therapies.  Follow-up CT chest in 3 months.  Patient then presented back to our office in June/2021 for an urgent emergency room follow-up.  He had 12 days of IV antibiotic therapy and was feeling no better.  Patient reports that he started to feel better when he was placed back on steroids.  He was given a prednisone taper last office visit with Judson Roch.  He is planning on following back up with infectious disease next month.  He reports that he is back down to his baseline at 10 mg of prednisone daily.  Unfortunately as  of 2 to 3 days ago he started to develop a fever of 101.4 degrees.  He is currently afebrile.  He is unsure what may have caused this.  He is having a productive cough with discolored mucus.  He reports brown mucus.  This is close to his baseline color.  He feels that he is making more of it than usual.  Patient spouse reporting that patient is weak with no appetite.  Currently afebrile today.  Is having exertional hypoxemia despite 4 L of O2.  Patient and spouse are both now interested in resuming palliative care services  Patient appointment today through a televisit so he is unable to coordinate be chest x-ray not originally ordered by SG NP.  We will discuss this today.  Observations/Objective:  04/15/2019 - EKG - QTC - 424  04/07/2018-CTA- nonocclusive PE centrally involving branches of the right middle lobe and right lower lobe, no evidence of right heart strain  11/04/2017-echocardiogram-LV ejection fraction of 65 to 58%, grade 1 diastolic dysfunction  0/99/8338-[id number] function test- FVC 4.59 (92% predicted), postbronchodilator ratio 29, postbronchodilator FEV1 1.41 (40% predicted), DLCO 72  11/27/2018-chest x-ray- no active cardiopulmonary disease, COPD  04/15/2019-chest x-ray-pulmonary hyperinflation emphysema without acute abnormality of the lungs  07/07/2019-chest x-ray-no radiographic evidence of acute cardiopulmonary disease, chronic peribronchial cuffing and emphysematous changes compatible with COPD, extensive scarring in the apices of the lungs bilaterally, right greater than left, aortic arthrosclerosis  11/17/2019-Resp Sputum - Pseudomonas Aeruginosa  >>> Pansensitive  Social History   Tobacco Use  Smoking Status Former  Smoker  . Packs/day: 2.00  . Years: 52.00  . Pack years: 104.00  . Types: Pipe, Cigarettes  . Start date: 10/06/1965  . Quit date: 10/06/2017  . Years since quitting: 2.2  Smokeless Tobacco Never Used  Tobacco Comment   04/07/2018 "smoked cigarettes  years ago; stopped ~ 20 yr ago; stopped smoking pipe in 09/2017"   Immunization History  Administered Date(s) Administered  . Fluad Quad(high Dose 65+) 03/16/2019  . Influenza Split 04/24/2012  . Influenza Whole 05/08/2009, 04/08/2011  . Influenza, High Dose Seasonal PF 05/01/2017, 03/31/2018  . Influenza,inj,Quad PF,6+ Mos 04/27/2013, 04/15/2014, 04/24/2015  . Influenza,inj,quad, With Preservative 05/01/2018  . Influenza-Unspecified 05/13/2016  . PFIZER SARS-COV-2 Vaccination 07/30/2019, 08/20/2019  . Pneumococcal Conjugate-13 02/25/2014  . Pneumococcal Polysaccharide-23 08/15/2011, 12/19/2016  . Pneumococcal-Unspecified 03/08/2017  . Td 02/21/2010  . Tdap 11/05/2017  . Zoster 01/29/2012      Assessment and Plan:  Discussion: Difficult case.  Difficult to fully assess the patient during a telephonic exam.  Patient did not complete the chest x-ray as previously ordered.  He will obtain the chest x-ray imaging tomorrow.  He reports that he is unable to do so today due to ongoing weakness and fatigue.  I have explained to patient that we will try to respect his wishes of managing this in the outpatient setting but if symptoms clinically worsen he will need to contact 911 or seek emergent evaluation in the emergency room.  He reports that he understands.  I have reviewed the case with [name].  We agree with restarting Omnicef as patient had clinically responded to this in the past.  Keep close follow-up with infectious disease.  We will reculture sputum to see if there is a different bacteria found on culture as well as that sensitivity.  I have personally contacted palliative care.  I explained to patient and spouse on numerous occasions and did as well today that I believe patient would benefit most from hospice therapy as he would receive more supportive in-home benefits.  They are willing to discuss and resume palliative care services at this time.  Reviewed ER precautions.  If  symptoms should worsen patient needs to seek out emergent care.  He reports he understands.  Stage 4 very severe COPD by GOLD classification ([place]) Discussion: I am concerned regarding the patient's worsening clinical status despite IV antibiotics.  I have encouraged him to keep close follow-up with our office as well as infectious disease.  I have discussed the case with [name].  We will proceed forward with repeating sputum cultures as well as getting a chest x-ray.  Patient reports that he will obtain these tomorrow.  I have recommended obtaining them today but he reports that he is unable to do so.  I have given the patient ER precautions reporting that if symptoms begin to worsen he needs to seek out emergent evaluation.  I am very concerned regarding the patient's age, respiratory status as well as increased fatigue, weakness.  Patient reports that he understands and will present to the emergency room if symptoms worsen.  Plan: Obtain chest x-ray, order placed for ER Obtain sputum cultures Omnicef today Prednisone taper, then resume daily 10 mg prednisone dosing Continue flutter valve Continue hypertonic saline nebs Continue Symbicort 160 Continue Spiriva Respimat 2.5 Continue Daliresp Hold Monday Wednesday Friday azithromycin when taking Omnicef then resume when finished I have personally contacted palliative care to resume your services I recommend considering hospice given the complexity of your respiratory  symptoms Keep follow-up with infectious disease  COPD with acute exacerbation ([place]) Likely ongoing COPD exacerbation Acute on chronic flare Intolerant of fluoroquinolones Previously responsive to [place]: Obtain chest x-ray Sputum cultures Start Omnicef Hold azithromycin Monday Wednesday Friday when taking Omnicef Prednisone taper, then resume daily prednisone dosing Close follow-up with our office  Chronic respiratory failure with hypoxia  ([place]) Plan: Continue oxygen therapy with 4 L with exertion   Follow Up Instructions:  Return in about 1 week (around 01/12/2020), or if symptoms worsen or fail to improve, for Follow up with [name] FNP-C.   I discussed the assessment and treatment plan with the patient. The patient was provided an opportunity to ask questions and all were answered. The patient agreed with the plan and demonstrated an understanding of the instructions.   The patient was advised to call back or seek an in-person evaluation if the symptoms worsen or if the condition fails to improve as anticipated.  I provided 45 minutes of non-face-to-face time during this encounter.   Lauraine Rinne, NP

## 2020-01-05 NOTE — Assessment & Plan Note (Signed)
Plan: Continue oxygen therapy with 4 L with exertion

## 2020-01-05 NOTE — Patient Instructions (Addendum)
You were seen today by Lauraine Rinne, NP  for:   It was pleasant talking with you today over the phone.  I am sorry that you are not feeling well.  Please obtain the chest x-ray at your soonest convenience and do the sputum cultures.  We will start you back on Omnicef as well as a prednisone taper.  Keep follow-up with infectious disease.  Should any of your symptoms worsen you will need to seek emergent evaluation at the ER or contact 911.  Take care and stay safe,  Joelle Roswell  1. Stage 4 very severe COPD by GOLD classification ([place])  - predniSONE (DELTASONE) 10 MG tablet; Take 3 tablets (30 mg total) by mouth daily with breakfast for 10 days, THEN 2 tablets (20 mg total) daily with breakfast for 10 days, THEN 1 tablet (10 mg total) daily with breakfast for 10 days.Take in the AM with food..  Dispense: 60 tablet; Refill: 0 - DG Chest 2 View; Future  Spiriva Respimat 2.5 >>> 2 puffs daily >>> Do this every day >>>This is not a rescue inhaler  Continue Symbicort 160 >>> 2 puffs in the morning right when you wake up, rinse out your mouth after use, 12 hours later 2 puffs, rinse after use >>> Take this daily, no matter what >>> This is not a rescue inhaler   Take prednisone taper as outlined then resume daily 10 mg prednisone dosing  Hold Monday Wednesday Friday azithromycin when taking Omnicef  Resume Monday Wednesday Friday azithromycin when finished with Omnicef  2. COPD with acute exacerbation ([facility])  - predniSONE (DELTASONE) 10 MG tablet; Take 3 tablets (30 mg total) by mouth daily with breakfast for 10 days, THEN 2 tablets (20 mg total) daily with breakfast for 10 days, THEN 1 tablet (10 mg total) daily with breakfast for 10 days. Take in the AM with food..  Dispense: 60 tablet; Refill: 0 - DG Chest 2 View; Future - Respiratory or Resp and Sputum Culture; Future - AFB Culture & Smear; Future - cefdinir (OMNICEF) 300 MG capsule; Take 1 capsule (300 mg total) by mouth 2 (two) times  daily.  Dispense: 14 capsule; Refill: 0  3. Chronic respiratory failure with hypoxia (HCC)  Continue oxygen therapy as prescribed  >>>maintain oxygen saturations greater than 88 percent  >>>if unable to maintain oxygen saturations please contact the office  >>>do not smoke with oxygen  >>>can use nasal saline gel or nasal saline rinses to moisturize nose if oxygen causes dryness   I have personally contacted palliative care to resume your services.  I explained to them multiple times that this should be a in home visit  I would recommend also considering hospice and hearing what services they could provide   We recommend today:  Orders Placed This Encounter  Procedures  . Respiratory or Resp and Sputum Culture    Standing Status:   Future    Standing Expiration Date:   01/04/2021  . AFB Culture & Smear    Standing Status:   Future    Standing Expiration Date:   01/04/2021  . DG Chest 2 View    Standing Status:   Future    Standing Expiration Date:   05/06/2020    Order Specific Question:   Reason for Exam (SYMPTOM  OR DIAGNOSIS REQUIRED)    Answer:   cough / copd    Order Specific Question:   Preferred imaging location?    Answer:   [facility]  Order Specific Question:   Radiology Contrast Protocol - do NOT remove file path    Answer:   [email or web address]   Orders Placed This Encounter  Procedures  . Respiratory or Resp and Sputum Culture  . AFB Culture & Smear  . DG Chest 2 View   Meds ordered this encounter  Medications  . predniSONE (DELTASONE) 10 MG tablet    Sig: Take 3 tablets (30 mg total) by mouth daily with breakfast for 10 days, THEN 2 tablets (20 mg total) daily with breakfast for 10 days, THEN 1 tablet (10 mg total) daily with breakfast for 10 days. Take 2 tablets (20mg  total) daily for the next 5 days. Take in the AM with food..    Dispense:  60 tablet    Refill:  0  . cefdinir (OMNICEF) 300 MG capsule    Sig:  Take 1 capsule (300 mg total) by mouth 2 (two) times daily.    Dispense:  14 capsule    Refill:  0    Follow Up:    Return in about 1 week (around 01/12/2020), or if symptoms worsen or fail to improve, for Follow up with [name] FNP-C.   Please do your part to reduce the spread of COVID-19:      Reduce your risk of any infection  and COVID19 by using the similar precautions used for avoiding the common cold or flu:  [name] Kitchen Wash your hands often with soap and warm water for at least 20 seconds.  If soap and water are not readily available, use an alcohol-based hand sanitizer with at least 60% alcohol.  . If coughing or sneezing, cover your mouth and nose by coughing or sneezing into the elbow areas of your shirt or coat, into a tissue or into your sleeve (not your hands). Langley Gauss A MASK when in public  . Avoid shaking hands with others and consider head nods or verbal greetings only. . Avoid touching your eyes, nose, or mouth with unwashed hands.  . Avoid close contact with people who are sick. . Avoid places or events with large numbers of people in one location, like concerts or sporting events. . If you have some symptoms but not all symptoms, continue to monitor at home and seek medical attention if your symptoms worsen. . If you are having a medical emergency, call 911.   [place] / e-Visit: [email or web address]         [facility]: [facility]: [phone]                   [facility]: [phone]     It is flu season:   >>> Best ways to protect herself from the flu: Receive the yearly flu vaccine, practice good hand hygiene washing with soap and also using hand sanitizer when available, eat a nutritious meals, get adequate rest, hydrate appropriately   Please contact the office if your symptoms worsen or you have  concerns that you are not improving.   Thank you for choosing [facility] for your healthcare, and for allowing [place] to partner with you on your healthcare journey. I am thankful to be able to provide care to you today.   [name] FNP-C

## 2020-01-05 NOTE — Assessment & Plan Note (Signed)
Likely ongoing COPD exacerbation Acute on chronic flare Intolerant of fluoroquinolones Previously responsive to [place]: Obtain chest x-ray Sputum cultures Start Omnicef Hold azithromycin Monday Wednesday Friday when taking Omnicef Prednisone taper, then resume daily prednisone dosing Close follow-up with our office

## 2020-01-05 NOTE — Telephone Encounter (Signed)
Checked with [name] about instructions for pt's pred taper.  Per [name]:  Pt to take 3tabs daily x10days, 2tabs daily x10days, then 1tab daily x10days.  Called pt's pharmacy and spoke with pharmacist Drue Dun and clarified the instructions for pt's pred taper. Kristie verbalized understanding. Nothing further needed.

## 2020-01-05 NOTE — Assessment & Plan Note (Signed)
Discussion: I am concerned regarding the patient's worsening clinical status despite IV antibiotics.  I have encouraged him to keep close follow-up with our office as well as infectious disease.  I have discussed the case with [name].  We will proceed forward with repeating sputum cultures as well as getting a chest x-ray.  Patient reports that he will obtain these tomorrow.  I have recommended obtaining them today but he reports that he is unable to do so.  I have given the patient ER precautions reporting that if symptoms begin to worsen he needs to seek out emergent evaluation.  I am very concerned regarding the patient's age, respiratory status as well as increased fatigue, weakness.  Patient reports that he understands and will present to the emergency room if symptoms worsen.  Plan: Obtain chest x-ray, order placed for ER Obtain sputum cultures Omnicef today Prednisone taper, then resume daily 10 mg prednisone dosing Continue flutter valve Continue hypertonic saline nebs Continue Symbicort 160 Continue Spiriva Respimat 2.5 Continue Daliresp Hold Monday Wednesday Friday azithromycin when taking Omnicef then resume when finished I have personally contacted palliative care to resume your services I recommend considering hospice given the complexity of your respiratory symptoms Keep follow-up with infectious disease

## 2020-01-06 ENCOUNTER — Telehealth: Payer: Self-pay | Admitting: Hospice

## 2020-01-06 ENCOUNTER — Other Ambulatory Visit: Payer: Self-pay

## 2020-01-06 ENCOUNTER — Ambulatory Visit (INDEPENDENT_AMBULATORY_CARE_PROVIDER_SITE_OTHER)
Admission: RE | Admit: 2020-01-06 | Discharge: 2020-01-06 | Disposition: A | Discharge status: Home / Self Care | Payer: Medicare Other | Source: Ambulatory Visit | Attending: Pulmonary Disease | Admitting: Pulmonary Disease

## 2020-01-06 DIAGNOSIS — J449 Chronic obstructive pulmonary disease, unspecified: Secondary | ICD-10-CM | POA: Diagnosis not present

## 2020-01-06 DIAGNOSIS — J441 Chronic obstructive pulmonary disease with (acute) exacerbation: Secondary | ICD-10-CM

## 2020-01-06 IMAGING — DX DG CHEST 2V
2 series · 2 of 2 positions shown · non-contrast
Comparison: Chest radiographs and CTA [DATE]

CLINICAL DATA: Follow-up pneumonia. Increased cough, congestion,
and shortness of breath. History of emphysema.

EXAM:
CHEST - 2 VIEW

[chest pa]
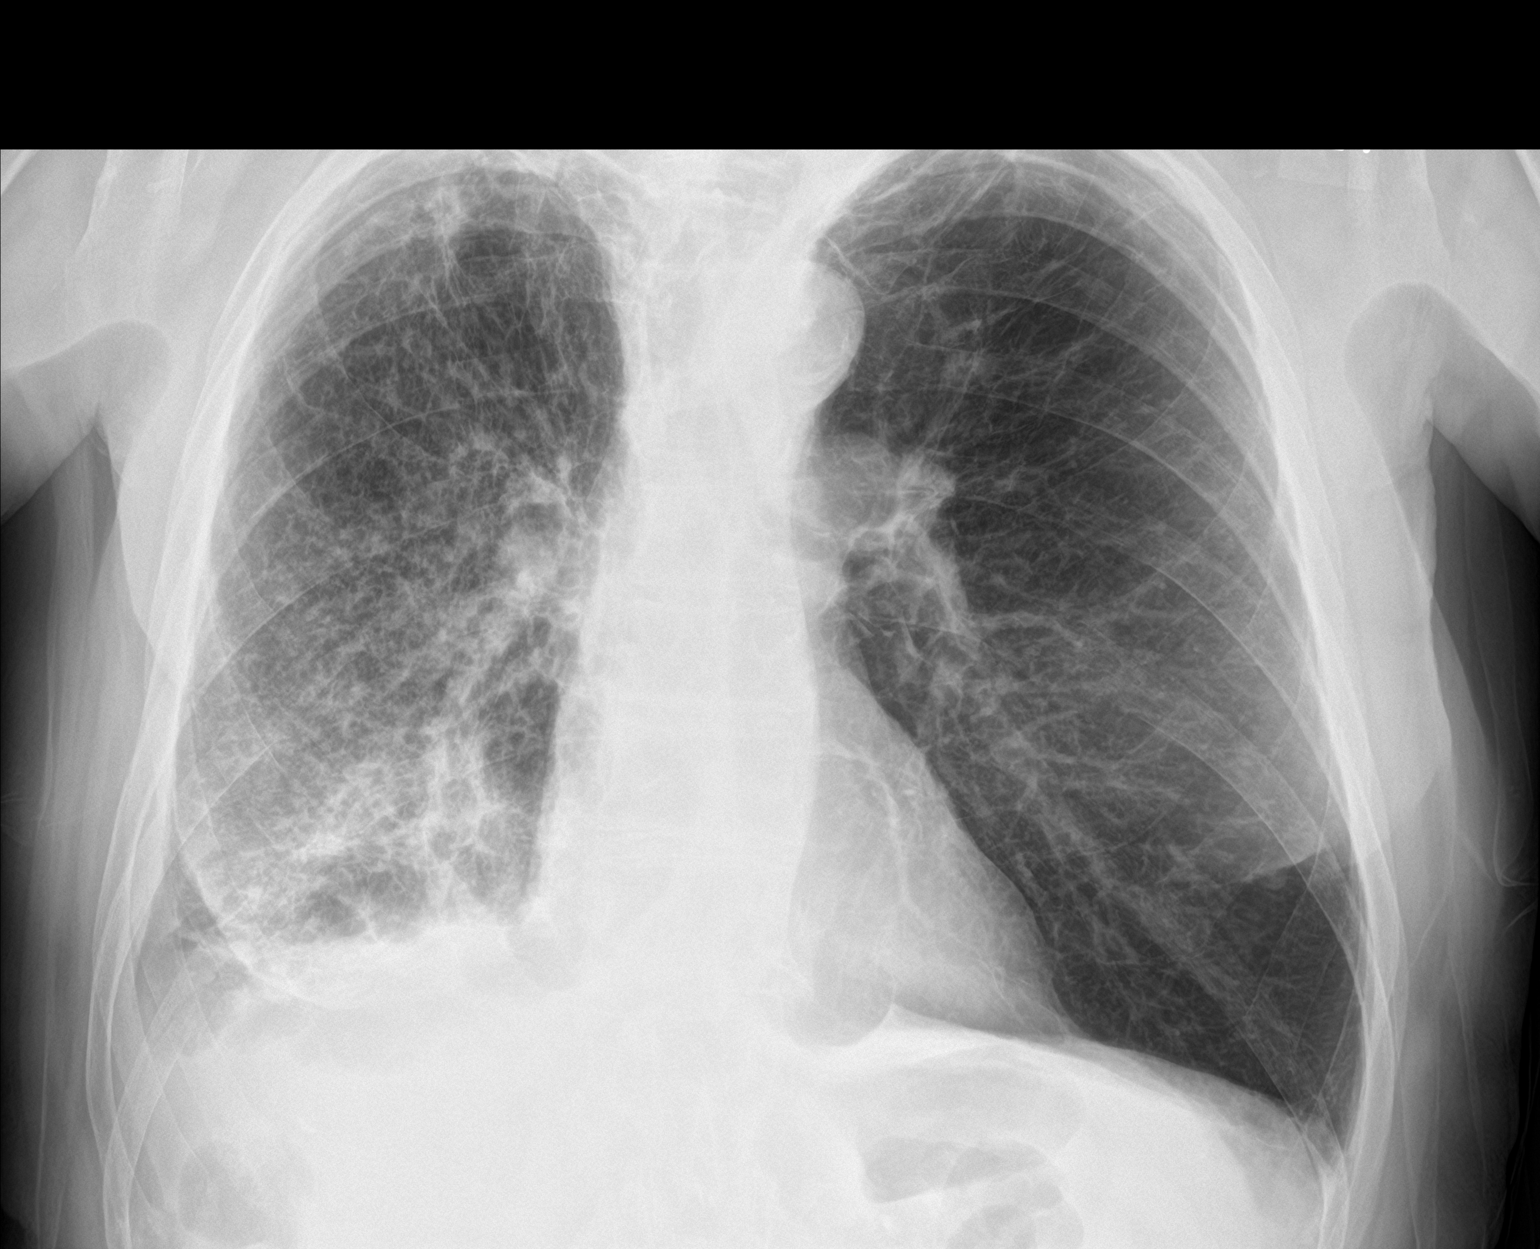

[chest lat]
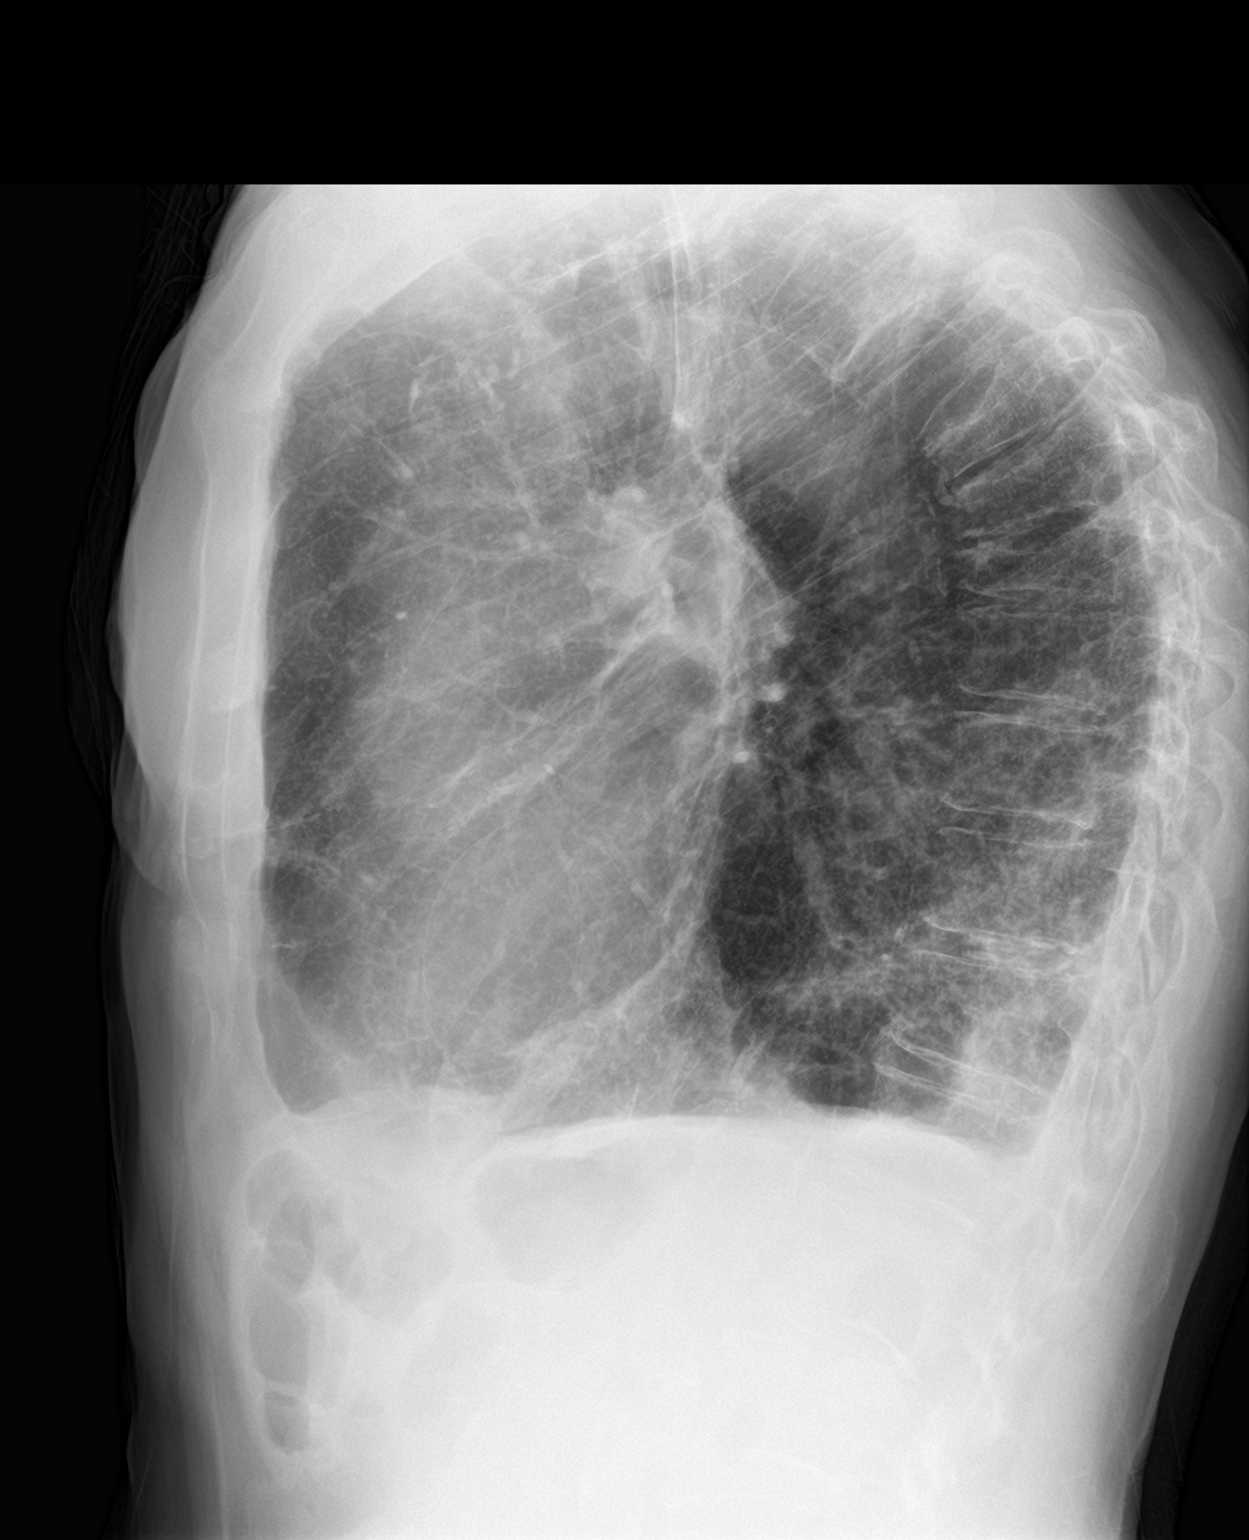

[2 of 2 positions shown; findings below may reference images not displayed]

FINDINGS: The right PICC has been removed. The cardiomediastinal silhouette is
unchanged with normal heart size. Aortic atherosclerosis is noted.
The lungs remain hyperinflated with underlying emphysema and
asymmetric right apical lung scarring. There are increased
heterogeneous opacities in the right lower lobe compared to the
prior studies, and there is a small right pleural effusion. Three
adjacent chronic compression fractures are again noted in the
midthoracic spine.
IMPRESSION: Worsening right lower lobe opacity compatible with pneumonia with a
small right pleural effusion.

## 2020-01-06 NOTE — Telephone Encounter (Signed)
Spoke with wife regarding Palliative referral and she was in agreement with starting services.  I have scheduled an In-person Consult for 01/07/20 @ 8:30 AM.

## 2020-01-07 ENCOUNTER — Other Ambulatory Visit: Payer: Medicare Other | Admitting: Hospice

## 2020-01-07 ENCOUNTER — Other Ambulatory Visit: Payer: Self-pay

## 2020-01-07 DIAGNOSIS — Z515 Encounter for palliative care: Secondary | ICD-10-CM

## 2020-01-07 DIAGNOSIS — J449 Chronic obstructive pulmonary disease, unspecified: Secondary | ICD-10-CM

## 2020-01-07 NOTE — Progress Notes (Addendum)
[employer] Palliative Care Consult Note Telephone: [phone]  Fax: [phone]  PATIENT NAME: NATHANYEL DEFENBAUGH DOB: [birth year] MRN: [id number]  PRIMARY CARE PROVIDER:   [name], MD  REFERRING PROVIDER: [name], MD Pulmonologist: Felix Ahmadi MD [name] NP   RESPONSIBLE PARTY:Self [phone] [address] [place] [address] Contact: Katy Apo ( spouse) [phone] c   RECOMMENDATIONS/PLAN:  Advance Care Planning/Goals of Care:  [facility] NP[facility]. Visit consisted of building trust and advance care planning discussions including the value and importance of advance care planning, exploration of goals of care in the event of a sudden illness/injury all progresssion of ongoing disease.  It also included exploration of personal beliefs that might influence advance care planning/medical decisions, and review and updates or completion of advance directive document.  Patient affirmed he is a DNR.  MOST form selections  include do not attempt resuscitation, limited additional intervention, antibiotics if indicated, IV fluids if indicated, no feeding tube.  Patient has signed MOST form and DNR at home.   Visit consisted of counseling and education dealing with the complex and emotionally intense issues of symptom management and palliative care in the setting of serious and potentially life-threatening illness.   Extensive discussion on COPD disease trajectory.  Patient is open to hospice services in the future when he is ready for it. Therapeutic listening and emotional support provided.  Palliative care team will continue to support patient, patient's family, and medical team. Symptom management: Patient is followed by [name] for stage IV very severe  COPD; with recurrent exacerbations needing prednisone burst and IV antibiotics.  Chart review indicates plan to repeat sputum  cultures and follow-up with infectious disease.  He was  last seen by pulmonologist 01/05/20- chest radiograph shows worsening right lobe opacity compatible with pneumonia with small right pleural effusion.  Patient was started on cefdinir 300 mg twice daily for 7 days and prednisone pack.  Patient wondering if this antibiotics is adequate for the pneumonia.  Encouraged taking the antibiotic and prednisone pack as ordered and to reach back out if no improvement. Patient on oxygen 4 L/min saturation 95% in no respiratory distress at this time; cough is ongoing, afebrile.  Also encouraged his use of breathing treatments as ordered.  Patient is compliant with medications, continues on flutter.  Follow-up appointment with pulmonologist 01/13/2020; to see Dr Jolinda Croak the Infectious disease doctor 01/20/2020. was started on prednisone pack and antibiotic cefdinir. Lilly reports that the pulmonologist started patient on  Breztri instead of Spiriva and Symbicort  Patient said so far the inhaler is helpful.  Patient was also started of 3% saline nebulization once daily with the hope that this will help to clear up with phlegm in his lungs. Patient continues on continuous 02 4L/Mi: helpful; no difficulty breathing/speaking during visit. Discussed proper breathing, resting in between activities and taking breathing treatments and antibiotics as ordered. Patient is ambulatory, SOB when walking and on moderate exertion;  Patient on Xarelto to prevent pulmonary embolism. Swelling in bilateral legs is ongoing r/t PAD with hx of Right common femoral endarterectomy bovine patch angioplasty. Compression socks and coban wraps tried in the past - not effective. Dr Oneida Alar is his Vein doctor; and he follows up scheduled.  Patient reported poor appetite.  Discussions on small meals of choice several times a day.  Patient taking Ensure.  Discussed possible use of mirtazapine 7.5 mg  daily to boost appetite.  Patient said this could be an  option in the future if poor appetite persists.  Encouraged ongoing care. Follow RC:VELFYBOFBP care will continue to follow patient for goals of care clarification and symptom management. I spentone hour and  50 minutesproviding this consultation; time includes chart review and documentation. More than 50% of the time in this consultation was spent on coordinating communication  HISTORY OF PRESENT ILLNESS:Silvano W Alleva IIis a 74 y.o.year oldmalewith multiple medical problems including stage 4 COPD, PAD chronic pulmonary embolism. Palliative Care was asked to help address goals of care.   CODE STATUS:DNR  PPS:50%.  HOSPICE ELIGIBILITY/DIAGNOSIS: Patient is eligible r/t stage !V severe COPD  PAST MEDICAL HISTORY:  Past Medical History:  Diagnosis Date  . Chest x-ray abnormality   . Chronic back pain    "mid and lower" (04/07/2018)  . Chronic rhinitis    -Sinus Ct 08/01/2009 >> Bilateral maxillary sinusitis with some mucosal thickeningin the sphenoid and frontal sinuses as well with air fluid levels present -chronic rhinitis flyer Aug 04, 2009  . Compressed spine fracture ([place])   . COPD (chronic obstructive pulmonary disease) [facility])    PFT's rec Jul 17, 2009  . Dyspnea   . Emphysema lung ([place])   . Emphysema of lung ([place])   . On home oxygen therapy    "3L; 24/7" (04/07/2018)  . Onychomycosis    Dr. Judi Cong  . Orthostatic hypotension    "since 10/2017" (04/07/2018)  . PAD (peripheral artery disease) ([place])   . Pneumonia    "twice in 1 year" (04/07/2018)  . Pulmonary embolism ([name]) 04/07/2018  . Skin cancer    "lips, face, ears, arms" (04/07/2018)  . Vertigo    "since ~ 02/2018" (04/07/2018)    SOCIAL HX:  Social History   Tobacco Use  . Smoking status: Former Smoker    Packs/day: 2.00    Years: 52.00    Pack years: 104.00    Types: Pipe, Cigarettes    Start date: 10/06/1965    Quit date: 10/06/2017    Years since quitting: 2.2  . Smokeless tobacco: Never Used  . Tobacco  comment: 04/07/2018 "smoked cigarettes years ago; stopped ~ 20 yr ago; stopped smoking pipe in 09/2017"  Substance Use Topics  . Alcohol use: Yes    Alcohol/week: 28.0 standard drinks    Types: 28 Cans of beer per week    Comment: "04/15/2019 "4 beers/day; probably closer to 28/wk"    ALLERGIES:  Allergies  Allergen Reactions  . Tape Other (See Comments)    SKIN IS VERY THIN; CAN ONLY USE COBAN WRAPS DUE TO CONDITION OF SKIN!!  . Ciprofloxacin Nausea Only    Sick on stomach, weak/tired  . Levaquin [Levofloxacin]     hallucinations     PERTINENT MEDICATIONS:  Outpatient Encounter Medications as of 01/07/2020  Medication Sig  . acetaminophen (TYLENOL) 500 MG tablet Take 500-1,000 mg by mouth every 6 (six) hours as needed for moderate pain.   [name] Kitchen albuterol (VENTOLIN HFA) 108 (90 Base) MCG/ACT inhaler USE 2 PUFFS EVERY 6 HOURS  AS NEEDED FOR WHEEZING (Patient taking differently: Inhale 2 puffs into the lungs every 6 (six) hours as needed. USE 2 PUFFS EVERY 6 HOURS  AS NEEDED FOR WHEEZING)  . azithromycin (ZITHROMAX) 250 MG tablet Take 1 tablet (250 mg total) by mouth every Monday, Wednesday, and Friday.  . Budeson-Glycopyrrol-Formoterol (BREZTRI AEROSPHERE) 160-9-4.8 MCG/ACT AERO Inhale 2 puffs into the lungs in the morning and at  bedtime.  . Budeson-Glycopyrrol-Formoterol (BREZTRI AEROSPHERE) 160-9-4.8 MCG/ACT AERO Inhale 2 puffs into the lungs 2 (two) times daily.  . budesonide-formoterol (SYMBICORT) 160-4.5 MCG/ACT inhaler Inhale 2 puffs into the lungs 2 (two) times daily.  . Calcium Carbonate-Vitamin D (CALCIUM 600+D PO) Take 2 tablets by mouth daily.  . cefdinir (OMNICEF) 300 MG capsule Take 1 capsule (300 mg total) by mouth 2 (two) times daily.  [name] Kitchen ceFEPIme (MAXIPIME) 2 g injection Inject 2 g into the vein every 8 (eight) hours.  . cycloSPORINE (RESTASIS) 0.05 % ophthalmic emulsion Place 1 drop into both eyes 2 (two) times daily as needed (dry eyes).   . fluticasone (FLONASE) 50 MCG/ACT  nasal spray Place 2 sprays into both nostrils daily.  . furosemide (LASIX) 20 MG tablet TAKE 1 TABLET BY MOUTH  DAILY  . OXYGEN Inhale 3 L into the lungs continuous.   . predniSONE (DELTASONE) 10 MG tablet Take 1 tablet (10 mg total) by mouth daily with breakfast.  . predniSONE (DELTASONE) 10 MG tablet 30mg (3 tabs daily) for three days, then 20mg (2 tabs daily) for three days  . predniSONE (DELTASONE) 10 MG tablet Take 3 tablets (30 mg total) by mouth daily with breakfast for 10 days, THEN 2 tablets (20 mg total) daily with breakfast for 10 days, THEN 1 tablet (10 mg total) daily with breakfast for 10 days. Take 2 tablets (20mg  total) daily for the next 5 days. Take in the AM with food..  . Respiratory Therapy Supplies (FLUTTER) DEVI 10 times Twice a day and prn as needed, may increase if feeling worse  . rivaroxaban (XARELTO) 20 MG TABS tablet TAKE 1 TABLET BY MOUTH  DAILY  . roflumilast (DALIRESP) 500 MCG TABS tablet Take 1 tablet (500 mcg total) by mouth daily.  . sodium chloride HYPERTONIC 3 % nebulizer solution Take by nebulization daily. (Patient taking differently: Take 4 mLs by nebulization daily as needed for cough. )  . SPIRIVA RESPIMAT 2.5 MCG/ACT AERS USE 2 INHALATIONS BY MOUTH  ONCE DAILY (Patient taking differently: Inhale 2 puffs into the lungs daily. )  . traMADol (ULTRAM) 50 MG tablet Take 1 tablet (50 mg total) by mouth every 12 (twelve) hours as needed for moderate pain or severe pain.   No facility-administered encounter medications on file as of 01/07/2020.    PHYSICAL EXAM/ROS:  General: NAD, cooperative Cardiovascular: regular rate and rhythm; denies chest pain Pulmonary: Rhonchorous breath sounds on bilateral lower lungs, more to the right; oxygen saturation 95% at 4 L/min, no shortness of breath during visit Abdomen: soft, nontender, + bowel sounds GU: no suprapubic tenderness Extremities: 1+ pitting edema to bilateral lower extremities,  Skin: Fragile skin that bruises  easily Neurological: Weakness but otherwise nonfocal  Teodoro Spray, NP

## 2020-01-11 ENCOUNTER — Other Ambulatory Visit: Payer: Self-pay | Admitting: *Deleted

## 2020-01-11 ENCOUNTER — Telehealth: Payer: Self-pay

## 2020-01-11 DIAGNOSIS — I739 Peripheral vascular disease, unspecified: Secondary | ICD-10-CM

## 2020-01-11 NOTE — Telephone Encounter (Addendum)
Triage call from pt c/o intermittent pain in Lt great toe and fifth toe at rest, which started last week and reports that both of these toes also began turning black over weekend. Reports when he walks, the discoloration sometimes improves temporarily but returns at rest. Reports both feet stay cold. He has numbness above Lt. Knee with some discomfort at times and reports having edema all the times.   Will have scheduling arrange appt for ABIs and MD this week if possible as discussed per Leontine Locket, PA-C.

## 2020-01-12 ENCOUNTER — Other Ambulatory Visit: Payer: Self-pay

## 2020-01-12 ENCOUNTER — Ambulatory Visit (INDEPENDENT_AMBULATORY_CARE_PROVIDER_SITE_OTHER): Payer: Medicare Other | Admitting: Vascular Surgery

## 2020-01-12 ENCOUNTER — Ambulatory Visit (HOSPITAL_COMMUNITY)
Admission: RE | Admit: 2020-01-12 | Discharge: 2020-01-12 | Disposition: A | Discharge status: Home / Self Care | Payer: Medicare Other | Source: Ambulatory Visit | Attending: Vascular Surgery | Admitting: Vascular Surgery

## 2020-01-12 ENCOUNTER — Encounter: Payer: Self-pay | Admitting: Vascular Surgery

## 2020-01-12 VITALS — BP 124/79 | HR 120 | Temp 97.1°F | Resp 20 | Ht 72.0 in | Wt 174.0 lb

## 2020-01-12 DIAGNOSIS — I739 Peripheral vascular disease, unspecified: Secondary | ICD-10-CM | POA: Insufficient documentation

## 2020-01-12 NOTE — Progress Notes (Signed)
REASON FOR VISIT:   Follow-up of peripheral vascular disease.  MEDICAL ISSUES:   PERIPHERAL VASCULAR DISEASE: The patient has some discoloration of the left great toe and is now developing some rest pain of the left foot when he elevates his leg.  When he had his arteriogram in June 2020 he did have some disease in the left common iliac artery I suspect that this has progressed as he has a diminished left femoral pulse.  I have recommended that he proceed with arteriography and possible iliac intervention on the left.  He had a good result on the right side.  This patient has been followed by [name] and we will schedule him for an arteriogram with [name] on a Friday in the near future.  I have discussed the indications of the procedure and the potential complications with the patient and he is agreeable to proceed.  He is on Xarelto for a history of a pulmonary embolus in 2019.  We will hold this 48 hours prior to the procedure.  HPI:   Francisco [name] is a pleasant 74 y.o. male who is followed by [name] with peripheral vascular disease.  He was last seen on 09/16/2019.  He had undergone a right common iliac artery stent in June 2020.  This was done for a nonhealing wound of his right foot.  He subsequently underwent right common femoral artery endarterectomy in October 2020.  Postoperative ABI was 91% on the right and 84% on the left.  Of note he is on Xarelto for a pulmonary embolus.  He is on 3 L of continuous home oxygen.  The main issue at this last visit was leg swelling.  He was felt to have adequate circulation bilaterally.  Since he was seen last he has developed some discoloration of his left great toe.  This came on gradually.  He has significant bilateral lower extremity swelling as documented below.  When he elevates his leg he develops rest pain in the left foot.  Thus I think his disease on the left is likely progressed.  I reviewed his previous arteriogram he had a  nice result from his right common iliac artery stent.  At that time he did have some disease of the left common iliac artery that was moderate.  I suspect that this has progressed.  His activity is very limited by his advanced COPD.  He is followed by pulmonary.  According to the wife he is being treated for pneumonia and is about to complete his course of antibiotics.  He ambulates very little because of his pulmonary status.  Past Medical History:  Diagnosis Date  . Chest x-ray abnormality   . Chronic back pain    "mid and lower" (04/07/2018)  . Chronic rhinitis    -Sinus Ct 08/01/2009 >> Bilateral maxillary sinusitis with some mucosal thickeningin the sphenoid and frontal sinuses as well with air fluid levels present -chronic rhinitis flyer Aug 04, 2009  . Compressed spine fracture ([place])   . COPD (chronic obstructive pulmonary disease) [facility])    PFT's rec Jul 17, 2009  . Dyspnea   . Emphysema lung ([place])   . Emphysema of lung ([place])   . On home oxygen therapy    "3L; 24/7" (04/07/2018)  . Onychomycosis    Dr. Judi Cong  . Orthostatic hypotension    "since 10/2017" (04/07/2018)  . PAD (peripheral artery disease) ([place])   . Pneumonia    "twice in 1 year" (04/07/2018)  .  Pulmonary embolism ([place]) 04/07/2018  . Skin cancer    "lips, face, ears, arms" (04/07/2018)  . Vertigo    "since ~ 02/2018" (04/07/2018)    Family History  Problem Relation Age of Onset  . Heart disease Mother        CABG in her 44s, nonsmoker  . Cancer Mother   . Stroke Father   . Heart disease Father        Died of MI at age 7, smoker  . Hepatitis Sister   . Coronary artery disease Other        male 1st degree relative <60    SOCIAL HISTORY: Social History   Tobacco Use  . Smoking status: Former Smoker    Packs/day: 2.00    Years: 52.00    Pack years: 104.00    Types: Pipe, Cigarettes    Start date: 10/06/1965    Quit date: 10/06/2017    Years since quitting: 2.2  . Smokeless tobacco: Never Used  .  Tobacco comment: 04/07/2018 "smoked cigarettes years ago; stopped ~ 20 yr ago; stopped smoking pipe in 09/2017"  Substance Use Topics  . Alcohol use: Yes    Alcohol/week: 28.0 standard drinks    Types: 28 Cans of beer per week    Comment: "04/15/2019 "4 beers/day; probably closer to 28/wk"    Allergies  Allergen Reactions  . Tape Other (See Comments)    SKIN IS VERY THIN; CAN ONLY USE COBAN WRAPS DUE TO CONDITION OF SKIN!!  . Ciprofloxacin Nausea Only    Sick on stomach, weak/tired  . Levaquin [Levofloxacin]     hallucinations    Current Outpatient Medications  Medication Sig Dispense Refill  . acetaminophen (TYLENOL) 500 MG tablet Take 500-1,000 mg by mouth every 6 (six) hours as needed for moderate pain.     [name] Kitchen albuterol (VENTOLIN HFA) 108 (90 Base) MCG/ACT inhaler USE 2 PUFFS EVERY 6 HOURS  AS NEEDED FOR WHEEZING (Patient taking differently: Inhale 2 puffs into the lungs every 6 (six) hours as needed. USE 2 PUFFS EVERY 6 HOURS  AS NEEDED FOR WHEEZING) 18 g 3  . azithromycin (ZITHROMAX) 250 MG tablet Take 1 tablet (250 mg total) by mouth every Monday, Wednesday, and Friday. 45 tablet 1  . Budeson-Glycopyrrol-Formoterol (BREZTRI AEROSPHERE) 160-9-4.8 MCG/ACT AERO Inhale 2 puffs into the lungs in the morning and at bedtime. 5.9 g 0  . Budeson-Glycopyrrol-Formoterol (BREZTRI AEROSPHERE) 160-9-4.8 MCG/ACT AERO Inhale 2 puffs into the lungs 2 (two) times daily. 17.7 g 0  . budesonide-formoterol (SYMBICORT) 160-4.5 MCG/ACT inhaler Inhale 2 puffs into the lungs 2 (two) times daily. 30 g 3  . Calcium Carbonate-Vitamin D (CALCIUM 600+D PO) Take 2 tablets by mouth daily.    . cefdinir (OMNICEF) 300 MG capsule Take 1 capsule (300 mg total) by mouth 2 (two) times daily. 14 capsule 0  . ceFEPIme (MAXIPIME) 2 g injection Inject 2 g into the vein every 8 (eight) hours. 1 each   . cycloSPORINE (RESTASIS) 0.05 % ophthalmic emulsion Place 1 drop into both eyes 2 (two) times daily as needed (dry eyes).       . fluticasone (FLONASE) 50 MCG/ACT nasal spray Place 2 sprays into both nostrils daily. 48 g 3  . furosemide (LASIX) 20 MG tablet TAKE 1 TABLET BY MOUTH  DAILY 90 tablet 3  . OXYGEN Inhale 3 L into the lungs continuous.     . predniSONE (DELTASONE) 10 MG tablet Take 1 tablet (10 mg total) by mouth daily with  breakfast. 90 tablet 0  . predniSONE (DELTASONE) 10 MG tablet 30mg (3 tabs daily) for three days, then 20mg (2 tabs daily) for three days 15 tablet 0  . predniSONE (DELTASONE) 10 MG tablet Take 3 tablets (30 mg total) by mouth daily with breakfast for 10 days, THEN 2 tablets (20 mg total) daily with breakfast for 10 days, THEN 1 tablet (10 mg total) daily with breakfast for 10 days. Take 2 tablets (20mg  total) daily for the next 5 days. Take in the AM with food.. 60 tablet 0  . Respiratory Therapy Supplies (FLUTTER) DEVI 10 times Twice a day and prn as needed, may increase if feeling worse 1 each 0  . rivaroxaban (XARELTO) 20 MG TABS tablet TAKE 1 TABLET BY MOUTH  DAILY 90 tablet 3  . roflumilast (DALIRESP) 500 MCG TABS tablet Take 1 tablet (500 mcg total) by mouth daily. 90 tablet 2  . sodium chloride HYPERTONIC 3 % nebulizer solution Take by nebulization daily. (Patient taking differently: Take 4 mLs by nebulization daily as needed for cough. ) 90 mL 12  . SPIRIVA RESPIMAT 2.5 MCG/ACT AERS USE 2 INHALATIONS BY MOUTH  ONCE DAILY (Patient taking differently: Inhale 2 puffs into the lungs daily. ) 12 g 3  . traMADol (ULTRAM) 50 MG tablet Take 1 tablet (50 mg total) by mouth every 12 (twelve) hours as needed for moderate pain or severe pain. 30 tablet 1   No current facility-administered medications for this visit.    REVIEW OF SYSTEMS:  [X]  denotes positive finding, [ ]  denotes negative finding Cardiac  Comments:  Chest pain or chest pressure:    Shortness of breath upon exertion: x   Short of breath when lying flat: x   Irregular heart rhythm:        Vascular    Pain in calf, thigh, or  hip brought on by ambulation:    Pain in feet at night that wakes you up from your sleep:  x   Blood clot in your veins:    Leg swelling:         Pulmonary    Oxygen at home: x   Productive cough:     Wheezing:         Neurologic    Sudden weakness in arms or legs:     Sudden numbness in arms or legs:     Sudden onset of difficulty speaking or slurred speech:    Temporary loss of vision in one eye:     Problems with dizziness:         Gastrointestinal    Blood in stool:     Vomited blood:         Genitourinary    Burning when urinating:     Blood in urine:        Psychiatric    Major depression:         Hematologic    Bleeding problems:    Problems with blood clotting too easily:        Skin    Rashes or ulcers:        Constitutional    Fever or chills:     PHYSICAL EXAM:   Vitals:   01/12/20 1039  BP: 124/79  Pulse: (!) 120  Resp: 20  Temp: (!) 97.1 F (36.2 C)  SpO2: 98%  Weight: 174 lb (78.9 kg)  Height: 6' (1.829 m)    GENERAL: The patient is a well-nourished male, in no acute distress. The vital  signs are documented above. CARDIAC: There is a regular rate and rhythm.  VASCULAR: I do not detect carotid bruits. He has a palpable right femoral pulse. I had to examine the patient in the wheelchair so it is difficult to assess his left femoral pulse but he does appear to have a diminished left femoral pulse. He has significant bilateral lower extremity swelling and I cannot palpate pedal pulses.      PULMONARY: There is good air exchange bilaterally without wheezing or rales. ABDOMEN: Soft and non-tender with normal pitched bowel sounds.  MUSCULOSKELETAL: There are no major deformities or cyanosis. NEUROLOGIC: No focal weakness or paresthesias are detected. SKIN: There are no ulcers or rashes noted. PSYCHIATRIC: The patient has a normal affect.  DATA:    ARTERIAL DOPPLER STUDY: I have independently interpreted his arterial Doppler study  today.  On the right side, there is a biphasic dorsalis pedis and posterior tibial signal.  ABIs 90%.  Toe pressure 76 mmHg.  On the left side he has a dampened monophasic anterior tibial and posterior tibial signal with the Doppler.  ABIs 29%.  Toe pressure is 0.  I reviewed his labs from 12/15/2019.  His GFR was 51 at that time.  Creatinine was 1.37.  Deitra Mayo [facility] [phone]

## 2020-01-12 NOTE — H&P (View-Only) (Signed)
REASON FOR VISIT:   Follow-up of peripheral vascular disease.  MEDICAL ISSUES:   PERIPHERAL VASCULAR DISEASE: The patient has some discoloration of the left great toe and is now developing some rest pain of the left foot when he elevates his leg.  When he had his arteriogram in June 2020 he did have some disease in the left common iliac artery I suspect that this has progressed as he has a diminished left femoral pulse.  I have recommended that he proceed with arteriography and possible iliac intervention on the left.  He had a good result on the right side.  This patient has been followed by [name] and we will schedule him for an arteriogram with [name] on a Friday in the near future.  I have discussed the indications of the procedure and the potential complications with the patient and he is agreeable to proceed.  He is on Xarelto for a history of a pulmonary embolus in 2019.  We will hold this 48 hours prior to the procedure.  HPI:   Francisco [name] is a pleasant 74 y.o. male who is followed by [name] with peripheral vascular disease.  He was last seen on 09/16/2019.  He had undergone a right common iliac artery stent in June 2020.  This was done for a nonhealing wound of his right foot.  He subsequently underwent right common femoral artery endarterectomy in October 2020.  Postoperative ABI was 91% on the right and 84% on the left.  Of note he is on Xarelto for a pulmonary embolus.  He is on 3 L of continuous home oxygen.  The main issue at this last visit was leg swelling.  He was felt to have adequate circulation bilaterally.  Since he was seen last he has developed some discoloration of his left great toe.  This came on gradually.  He has significant bilateral lower extremity swelling as documented below.  When he elevates his leg he develops rest pain in the left foot.  Thus I think his disease on the left is likely progressed.  I reviewed his previous arteriogram he had a  nice result from his right common iliac artery stent.  At that time he did have some disease of the left common iliac artery that was moderate.  I suspect that this has progressed.  His activity is very limited by his advanced COPD.  He is followed by pulmonary.  According to the wife he is being treated for pneumonia and is about to complete his course of antibiotics.  He ambulates very little because of his pulmonary status.  Past Medical History:  Diagnosis Date  . Chest x-ray abnormality   . Chronic back pain    "mid and lower" (04/07/2018)  . Chronic rhinitis    -Sinus Ct 08/01/2009 >> Bilateral maxillary sinusitis with some mucosal thickeningin the sphenoid and frontal sinuses as well with air fluid levels present -chronic rhinitis flyer Aug 04, 2009  . Compressed spine fracture ([place])   . COPD (chronic obstructive pulmonary disease) [facility])    PFT's rec Jul 17, 2009  . Dyspnea   . Emphysema lung ([place])   . Emphysema of lung ([place])   . On home oxygen therapy    "3L; 24/7" (04/07/2018)  . Onychomycosis    Dr. Judi Cong  . Orthostatic hypotension    "since 10/2017" (04/07/2018)  . PAD (peripheral artery disease) ([place])   . Pneumonia    "twice in 1 year" (04/07/2018)  .  Pulmonary embolism ([place]) 04/07/2018  . Skin cancer    "lips, face, ears, arms" (04/07/2018)  . Vertigo    "since ~ 02/2018" (04/07/2018)    Family History  Problem Relation Age of Onset  . Heart disease Mother        CABG in her 89s, nonsmoker  . Cancer Mother   . Stroke Father   . Heart disease Father        Died of MI at age 64, smoker  . Hepatitis Sister   . Coronary artery disease Other        male 1st degree relative <60    SOCIAL HISTORY: Social History   Tobacco Use  . Smoking status: Former Smoker    Packs/day: 2.00    Years: 52.00    Pack years: 104.00    Types: Pipe, Cigarettes    Start date: 10/06/1965    Quit date: 10/06/2017    Years since quitting: 2.2  . Smokeless tobacco: Never Used  .  Tobacco comment: 04/07/2018 "smoked cigarettes years ago; stopped ~ 20 yr ago; stopped smoking pipe in 09/2017"  Substance Use Topics  . Alcohol use: Yes    Alcohol/week: 28.0 standard drinks    Types: 28 Cans of beer per week    Comment: "04/15/2019 "4 beers/day; probably closer to 28/wk"    Allergies  Allergen Reactions  . Tape Other (See Comments)    SKIN IS VERY THIN; CAN ONLY USE COBAN WRAPS DUE TO CONDITION OF SKIN!!  . Ciprofloxacin Nausea Only    Sick on stomach, weak/tired  . Levaquin [Levofloxacin]     hallucinations    Current Outpatient Medications  Medication Sig Dispense Refill  . acetaminophen (TYLENOL) 500 MG tablet Take 500-1,000 mg by mouth every 6 (six) hours as needed for moderate pain.     [name] Kitchen albuterol (VENTOLIN HFA) 108 (90 Base) MCG/ACT inhaler USE 2 PUFFS EVERY 6 HOURS  AS NEEDED FOR WHEEZING (Patient taking differently: Inhale 2 puffs into the lungs every 6 (six) hours as needed. USE 2 PUFFS EVERY 6 HOURS  AS NEEDED FOR WHEEZING) 18 g 3  . azithromycin (ZITHROMAX) 250 MG tablet Take 1 tablet (250 mg total) by mouth every Monday, Wednesday, and Friday. 45 tablet 1  . Budeson-Glycopyrrol-Formoterol (BREZTRI AEROSPHERE) 160-9-4.8 MCG/ACT AERO Inhale 2 puffs into the lungs in the morning and at bedtime. 5.9 g 0  . Budeson-Glycopyrrol-Formoterol (BREZTRI AEROSPHERE) 160-9-4.8 MCG/ACT AERO Inhale 2 puffs into the lungs 2 (two) times daily. 17.7 g 0  . budesonide-formoterol (SYMBICORT) 160-4.5 MCG/ACT inhaler Inhale 2 puffs into the lungs 2 (two) times daily. 30 g 3  . Calcium Carbonate-Vitamin D (CALCIUM 600+D PO) Take 2 tablets by mouth daily.    . cefdinir (OMNICEF) 300 MG capsule Take 1 capsule (300 mg total) by mouth 2 (two) times daily. 14 capsule 0  . ceFEPIme (MAXIPIME) 2 g injection Inject 2 g into the vein every 8 (eight) hours. 1 each   . cycloSPORINE (RESTASIS) 0.05 % ophthalmic emulsion Place 1 drop into both eyes 2 (two) times daily as needed (dry eyes).      . fluticasone (FLONASE) 50 MCG/ACT nasal spray Place 2 sprays into both nostrils daily. 48 g 3  . furosemide (LASIX) 20 MG tablet TAKE 1 TABLET BY MOUTH  DAILY 90 tablet 3  . OXYGEN Inhale 3 L into the lungs continuous.     . predniSONE (DELTASONE) 10 MG tablet Take 1 tablet (10 mg total) by mouth daily with breakfast.  90 tablet 0  . predniSONE (DELTASONE) 10 MG tablet 30mg (3 tabs daily) for three days, then 20mg (2 tabs daily) for three days 15 tablet 0  . predniSONE (DELTASONE) 10 MG tablet Take 3 tablets (30 mg total) by mouth daily with breakfast for 10 days, THEN 2 tablets (20 mg total) daily with breakfast for 10 days, THEN 1 tablet (10 mg total) daily with breakfast for 10 days. Take 2 tablets (20mg  total) daily for the next 5 days. Take in the AM with food.. 60 tablet 0  . Respiratory Therapy Supplies (FLUTTER) DEVI 10 times Twice a day and prn as needed, may increase if feeling worse 1 each 0  . rivaroxaban (XARELTO) 20 MG TABS tablet TAKE 1 TABLET BY MOUTH  DAILY 90 tablet 3  . roflumilast (DALIRESP) 500 MCG TABS tablet Take 1 tablet (500 mcg total) by mouth daily. 90 tablet 2  . sodium chloride HYPERTONIC 3 % nebulizer solution Take by nebulization daily. (Patient taking differently: Take 4 mLs by nebulization daily as needed for cough. ) 90 mL 12  . SPIRIVA RESPIMAT 2.5 MCG/ACT AERS USE 2 INHALATIONS BY MOUTH  ONCE DAILY (Patient taking differently: Inhale 2 puffs into the lungs daily. ) 12 g 3  . traMADol (ULTRAM) 50 MG tablet Take 1 tablet (50 mg total) by mouth every 12 (twelve) hours as needed for moderate pain or severe pain. 30 tablet 1   No current facility-administered medications for this visit.    REVIEW OF SYSTEMS:  [X]  denotes positive finding, [ ]  denotes negative finding Cardiac  Comments:  Chest pain or chest pressure:    Shortness of breath upon exertion: x   Short of breath when lying flat: x   Irregular heart rhythm:        Vascular    Pain in calf, thigh, or  hip brought on by ambulation:    Pain in feet at night that wakes you up from your sleep:  x   Blood clot in your veins:    Leg swelling:         Pulmonary    Oxygen at home: x   Productive cough:     Wheezing:         Neurologic    Sudden weakness in arms or legs:     Sudden numbness in arms or legs:     Sudden onset of difficulty speaking or slurred speech:    Temporary loss of vision in one eye:     Problems with dizziness:         Gastrointestinal    Blood in stool:     Vomited blood:         Genitourinary    Burning when urinating:     Blood in urine:        Psychiatric    Major depression:         Hematologic    Bleeding problems:    Problems with blood clotting too easily:        Skin    Rashes or ulcers:        Constitutional    Fever or chills:     PHYSICAL EXAM:   Vitals:   01/12/20 1039  BP: 124/79  Pulse: (!) 120  Resp: 20  Temp: (!) 97.1 F (36.2 C)  SpO2: 98%  Weight: 174 lb (78.9 kg)  Height: 6' (1.829 m)    GENERAL: The patient is a well-nourished male, in no acute distress. The vital signs  are documented above. CARDIAC: There is a regular rate and rhythm.  VASCULAR: I do not detect carotid bruits. He has a palpable right femoral pulse. I had to examine the patient in the wheelchair so it is difficult to assess his left femoral pulse but he does appear to have a diminished left femoral pulse. He has significant bilateral lower extremity swelling and I cannot palpate pedal pulses.      PULMONARY: There is good air exchange bilaterally without wheezing or rales. ABDOMEN: Soft and non-tender with normal pitched bowel sounds.  MUSCULOSKELETAL: There are no major deformities or cyanosis. NEUROLOGIC: No focal weakness or paresthesias are detected. SKIN: There are no ulcers or rashes noted. PSYCHIATRIC: The patient has a normal affect.  DATA:    ARTERIAL DOPPLER STUDY: I have independently interpreted his arterial Doppler study  today.  On the right side, there is a biphasic dorsalis pedis and posterior tibial signal.  ABIs 90%.  Toe pressure 76 mmHg.  On the left side he has a dampened monophasic anterior tibial and posterior tibial signal with the Doppler.  ABIs 29%.  Toe pressure is 0.  I reviewed his labs from 12/15/2019.  His GFR was 51 at that time.  Creatinine was 1.37.  Deitra [facility] [phone]

## 2020-01-13 ENCOUNTER — Encounter: Payer: Self-pay | Admitting: Pulmonary Disease

## 2020-01-13 ENCOUNTER — Telehealth: Payer: Self-pay | Admitting: *Deleted

## 2020-01-13 ENCOUNTER — Ambulatory Visit (INDEPENDENT_AMBULATORY_CARE_PROVIDER_SITE_OTHER): Payer: Medicare Other | Admitting: Pulmonary Disease

## 2020-01-13 DIAGNOSIS — J9611 Chronic respiratory failure with hypoxia: Secondary | ICD-10-CM | POA: Diagnosis not present

## 2020-01-13 DIAGNOSIS — J449 Chronic obstructive pulmonary disease, unspecified: Secondary | ICD-10-CM

## 2020-01-13 DIAGNOSIS — J151 Pneumonia due to Pseudomonas: Secondary | ICD-10-CM

## 2020-01-13 DIAGNOSIS — I2782 Chronic pulmonary embolism: Secondary | ICD-10-CM | POA: Diagnosis not present

## 2020-01-13 DIAGNOSIS — Z01811 Encounter for preprocedural respiratory examination: Secondary | ICD-10-CM

## 2020-01-13 DIAGNOSIS — I739 Peripheral vascular disease, unspecified: Secondary | ICD-10-CM

## 2020-01-13 MED ORDER — BREZTRI AEROSPHERE 160-9-4.8 MCG/ACT IN AERO: 2.0000 | INHALATION_SPRAY | Freq: Two times a day (BID) | RESPIRATORY_TRACT | 3 refills | Status: DC

## 2020-01-13 NOTE — Addendum Note (Signed)
Addended by: Vanessa Barbara on: 01/13/2020 11:00 AM   Modules accepted: Orders

## 2020-01-13 NOTE — Telephone Encounter (Signed)
Called and spoke with patient's wife, Katy Apo listed on [place].  Made aware the prescriptions for Judithann Sauger was sent to [facility] for 90 day supply.  4 week f/u scheduled with MR scheduled.  Nothing further needed.

## 2020-01-13 NOTE — Patient Instructions (Signed)
You were seen today by Lauraine Rinne, NP  for:   1. Pneumonia of right lower lobe due to Pseudomonas species ([place])  - DG Chest 2 View; Future  Keep follow-up with infectious disease  We will see back in 4 weeks with a chest x-ray  If your symptoms worsen such as fever, increased fatigue, loss of appetite, increased mucus production  2. Stage 4 very severe COPD by GOLD classification ([place])  - DG Chest 2 View; Future  Breztri >>> 2 puffs in the morning right when you wake up, rinse out your mouth after use, 12 hours later 2 puffs, rinse after use >>> Take this daily, no matter what >>> This is not a rescue inhaler   Only use your albuterol as a rescue medication to be used if you can't catch your breath by resting or doing a relaxed purse lip breathing pattern.  - The less you use it, the better it will work when you need it. - Ok to use up to 2 puffs  every 4 hours if you must but call for immediate appointment if use goes up over your usual need - Don't leave home without it !!  (think of it like the spare tire for your car)    3. Chronic respiratory failure with hypoxia (HCC)  - DG Chest 2 View; Future  Continue oxygen therapy as prescribed  >>>maintain oxygen saturations greater than 88 percent  >>>if unable to maintain oxygen saturations please contact the office  >>>do not smoke with oxygen  >>>can use nasal saline gel or nasal saline rinses to moisturize nose if oxygen causes dryness  4. Other chronic pulmonary embolism, unspecified whether acute cor pulmonale present (HCC)  Continue Xarelto  5. PAD (peripheral artery disease) ([place])  Continue vascular follow-up  Message sent to vascular team to update them regarding pulmonary status    6. Pre-operative respiratory examination  Peri-operative Assessment of Pulmonary Risk for Non-Thoracic Surgery:  For Mr. Armato, moderate to high risk of perioperative pulmonary complications is increased by:  Age greater than  31 years  COPD  Chronic Resp Failure   Recent RLL Pneumonia   Pseudomonas Colonization   Recent IV treatment    Respiratory complications generally occur in 1% of ASA Class I patients, 5% of ASA Class II and 10% of ASA Class III-IV patients These complications rarely result in mortality and iclude postoperative pneumonia, atelectasis, pulmonary embolism, ARDS and increased time requiring postoperative mechanical ventilation.  Overall, I recommend proceeding with the surgery if the risk for respiratory complications are outweighed by the potential benefits. This will need to be discussed between the patient and surgeon.  To reduce risks of respiratory complications, I recommend: --Pre- and post-operative incentive spirometry performed frequently while awake --Avoiding use of pancuronium during anesthesia.  I have discussed the risk factors and recommendations above with the patient.    We recommend today:  Orders Placed This Encounter  Procedures  . DG Chest 2 View    Standing Status:   Future    Standing Expiration Date:   05/15/2020    Order Specific Question:   Reason for Exam (SYMPTOM  OR DIAGNOSIS REQUIRED)    Answer:   follow up pneumonia    Order Specific Question:   Preferred imaging location?    Answer:   Internal    Order Specific Question:   Radiology Contrast Protocol - do NOT remove file path    Answer:   [email or web address]   Orders Placed This  Encounter  Procedures  . DG Chest 2 View   No orders of the defined types were placed in this encounter.   Follow Up:    Return in about 4 weeks (around 02/10/2020), or if symptoms worsen or fail to improve, for Follow up with [name] FNP-C, With Chest Xray.   Please do your part to reduce the spread of COVID-19:      Reduce your risk of any infection  and COVID19 by using the similar precautions used for avoiding the common cold or flu:  [name] Kitchen Wash your hands often with soap  and warm water for at least 20 seconds.  If soap and water are not readily available, use an alcohol-based hand sanitizer with at least 60% alcohol.  . If coughing or sneezing, cover your mouth and nose by coughing or sneezing into the elbow areas of your shirt or coat, into a tissue or into your sleeve (not your hands). Langley Gauss A MASK when in public  . Avoid shaking hands with others and consider head nods or verbal greetings only. . Avoid touching your eyes, nose, or mouth with unwashed hands.  . Avoid close contact with people who are sick. . Avoid places or events with large numbers of people in one location, like concerts or sporting events. . If you have some symptoms but not all symptoms, continue to monitor at home and seek medical attention if your symptoms worsen. . If you are having a medical emergency, call 911.   [place] / e-Visit: [email or web address]         [facility]: [facility]: [phone]                   [facility]: [phone]     It is flu season:   >>> Best ways to protect herself from the flu: Receive the yearly flu vaccine, practice good hand hygiene washing with soap and also using hand sanitizer when available, eat a nutritious meals, get adequate rest, hydrate appropriately   Please contact the office if your symptoms worsen or you have concerns that you are not improving.   Thank you for choosing [facility] for your healthcare, and for allowing [place] to partner with you on your healthcare journey. I am thankful to be able to provide care to you today.   [name] FNP-C

## 2020-01-13 NOTE — Assessment & Plan Note (Signed)
Plan: Continue forward with vein and vascular work-up Message sent to Dr. Scot Dock as well as [name] to update them on patient's pulmonary status Patient planned to have surgery next week

## 2020-01-13 NOTE — Assessment & Plan Note (Signed)
Plan: Continue Xarelto 

## 2020-01-13 NOTE — Assessment & Plan Note (Signed)
Plan: Continue oxygen therapy with 4 L with exertion

## 2020-01-13 NOTE — Assessment & Plan Note (Signed)
Plan: Continue prednisone taper, then resume daily 10 mg prednisone dosing Continue flutter valve Continue hypertonic saline nebs Continue Breztri  Continue Daliresp Continue Monday Wednesday Friday azithromycin Continue follow-up with palliative care Keep follow-up with infectious disease 4-week follow-up with our office for an in office evaluation with chest x-ray  I continue to emphasize ER precautions with the patient as well as spouse.  If symptoms start to decline despite recent treatment of Omnicef, long prednisone taper and IV antibiotics patient will need to seek emergent evaluation at the emergency room.

## 2020-01-13 NOTE — Assessment & Plan Note (Signed)
Peri-operative Assessment of Pulmonary Risk for Non-Thoracic Surgery:  Francisco [name], moderate to high risk of perioperative pulmonary complications is increased by:  Age greater than 65 years  COPD  Chronic Resp Failure   Recent RLL Pneumonia   Pseudomonas Colonization   Recent IV treatment    Respiratory complications generally occur in 1% of ASA Class I patients, 5% of ASA Class II and 10% of ASA Class III-IV patients These complications rarely result in mortality and iclude postoperative pneumonia, atelectasis, pulmonary embolism, ARDS and increased time requiring postoperative mechanical ventilation.  Overall, I recommend proceeding with the surgery if the risk for respiratory complications are outweighed by the potential benefits. This will need to be discussed between the patient and surgeon.  To reduce risks of respiratory complications, I recommend: --Pre- and post-operative incentive spirometry performed frequently while awake --Avoiding use of pancuronium during anesthesia.  I have discussed the risk factors and recommendations above with the patient.

## 2020-01-13 NOTE — Progress Notes (Signed)
Virtual Visit via Telephone Note  I connected with Francisco [name] on 01/13/20 at 10:00 AM EDT by telephone and verified that I am speaking with the correct person using two identifiers.  Location: Patient: Home Provider: [facility] - [address], [place], [place], [place] [address]   I discussed the limitations, risks, security and privacy concerns of performing an evaluation and management service by telephone and the availability of in person appointments. I also discussed with the patient that there may be a patient responsible charge related to this service. The patient expressed understanding and agreed to proceed.  Patient consented to consult via telephone: Yes People present and their role in pt care: Pt     History of Present Illness:  74 year old male former smoker followed in our office for COPD  PMH: Hypertension, osteoporosis, chronic PE Smoker/ Smoking History: Former smoker.  Quit smoking in 2019.  104-pack-year smoking history. Maintenance: Breztri, 10 mg prednisone daily, daliresp, Azithro MWF  Pt of: [name]  Chief complaint: 1 week follow up   74 year old male former smoker followed in our office for COPD.  He is completing a 1 week telephonic follow-up.  He recently completed a chest x-ray last week that showed a right lower lobe pneumonia.  This seems to be more of a chronic infection given his Pseudomonas colonization.  Patient finished his last dose of Omnicef yesterday.  He reports that he is feeling better today slightly.  He has also already had 2 weeks of IV antibiotics managed by infectious disease due to the Pseudomonas colonization.  Patient has a repeat follow-up appointment with infectious disease next week with [name].  He reports that he recently saw Dr. Scot Dock with vein and vascular yesterday and they are planning on a surgical procedure for his PAD next week with [name].  Patient is under the impression that they  will reach out to our office for surgical clearance.  Patient continues to be on a long prednisone taper.  He is currently on 30 mg daily and titrating down to 20 mg daily tomorrow.  His appetite has improved and he is not having any fevers at this time.  He is wondering if he needs to keep the infectious disease appointment we will discuss this today.  Patient is also reestablished with palliative care.  They completed a home visit last week.  Observations/Objective:  04/15/2019 - EKG - QTC - 424  04/07/2018-CTA- nonocclusive PE centrally involving branches of the right middle lobe and right lower lobe, no evidence of right heart strain  11/04/2017-echocardiogram-LV ejection fraction of 65 to 73%, grade 1 diastolic dysfunction  01/15/6268-SWNIOEVOJ function test- FVC 4.59 (92% predicted), postbronchodilator ratio 29, postbronchodilator FEV1 1.41 (40% predicted), DLCO 72  11/27/2018-chest x-ray- no active cardiopulmonary disease, COPD  04/15/2019-chest x-ray-pulmonary hyperinflation emphysema without acute abnormality of the lungs  07/07/2019-chest x-ray-no radiographic evidence of acute cardiopulmonary disease, chronic peribronchial cuffing and emphysematous changes compatible with COPD, extensive scarring in the apices of the lungs bilaterally, right greater than left, aortic arthrosclerosis  11/17/2019-Resp Sputum - Pseudomonas Aeruginosa  >>> Pansensitive  Social History   Tobacco Use  Smoking Status Former Smoker  . Packs/day: 2.00  . Years: 52.00  . Pack years: 104.00  . Types: Pipe, Cigarettes  . Start date: 10/06/1965  . Quit date: 10/06/2017  . Years since quitting: 2.2  Smokeless Tobacco Never Used  Tobacco Comment   04/07/2018 "smoked cigarettes years ago; stopped ~ 20 yr ago;  stopped smoking pipe in 09/2017"   Immunization History  Administered Date(s) Administered  . Fluad Quad(high Dose 65+) 03/16/2019  . Influenza Split 04/24/2012  . Influenza Whole 05/08/2009,  04/08/2011  . Influenza, High Dose Seasonal PF 05/01/2017, 03/31/2018  . Influenza,inj,Quad PF,6+ Mos 04/27/2013, 04/15/2014, 04/24/2015  . Influenza,inj,quad, With Preservative 05/01/2018  . Influenza-Unspecified 05/13/2016  . PFIZER SARS-COV-2 Vaccination 07/30/2019, 08/20/2019  . Pneumococcal Conjugate-13 02/25/2014  . Pneumococcal Polysaccharide-23 08/15/2011, 12/19/2016  . Pneumococcal-Unspecified 03/08/2017  . Td 02/21/2010  . Tdap 11/05/2017  . Zoster 01/29/2012      Assessment and Plan:  Chronic pulmonary embolism ([place]) Plan: Continue Xarelto  PAD (peripheral artery disease) ([facility]) Plan: Continue forward with vein and vascular work-up Message sent to Dr. Scot Dock as well as [name] to update them on patient's pulmonary status Patient planned to have surgery next week  Chronic respiratory failure with hypoxia ([place]) Plan: Continue oxygen therapy with 4 L with exertion  Stage 4 very severe COPD by GOLD classification ([place]) Plan: Continue prednisone taper, then resume daily 10 mg prednisone dosing Continue flutter valve Continue hypertonic saline nebs Continue Breztri  Continue Daliresp Continue Monday Wednesday Friday azithromycin Continue follow-up with palliative care Keep follow-up with infectious disease 4-week follow-up with our office for an in office evaluation with chest x-ray  I continue to emphasize ER precautions with the patient as well as spouse.  If symptoms start to decline despite recent treatment of Omnicef, long prednisone taper and IV antibiotics patient will need to seek emergent evaluation at the emergency room.  Pre-operative respiratory examination Peri-operative Assessment of Pulmonary Risk for Non-Thoracic Surgery:  ForMr. Yielding, moderate to high risk of perioperative pulmonary complications is increased by:  Age greater than 65 years  COPD  Chronic Resp Failure   Recent RLL Pneumonia   Pseudomonas Colonization   Recent IV  treatment    Respiratory complications generally occur in 1% of ASA Class I patients, 5% of ASA Class [name] and 10% of ASA Class III-IV patients These complications rarely result in mortality and iclude postoperative pneumonia, atelectasis, pulmonary embolism, ARDS and increased time requiring postoperative mechanical ventilation.  Overall, I recommend proceeding with the surgery if the risk for respiratory complications are outweighed by the potential benefits. This will need to be discussed between the patient and surgeon.  To reduce risks of respiratory complications, I recommend: --Pre- and post-operative incentive spirometry performed frequently while awake --Avoiding use of pancuronium during anesthesia.  I have discussed the risk factors and recommendations above with the patient.    Follow Up Instructions:  Return in about 4 weeks (around 02/10/2020), or if symptoms worsen or fail to improve, for Follow up with [name] FNP-C, With Chest Xray.   I discussed the assessment and treatment plan with the patient. The patient was provided an opportunity to ask questions and all were answered. The patient agreed with the plan and demonstrated an understanding of the instructions.   The patient was advised to call back or seek an in-person evaluation if the symptoms worsen or if the condition fails to improve as anticipated.  I provided 32 minutes of non-face-to-face time during this encounter.   Lauraine Rinne, NP

## 2020-01-14 ENCOUNTER — Telehealth: Payer: Self-pay | Admitting: Internal Medicine

## 2020-01-14 NOTE — Telephone Encounter (Signed)
Spoke with Francisco [name] who spoke with Dr. Scot Dock. Per Dr. Scot Dock, Vascular is going to hold off on the surgery until we have seen clinical improvement with breathing.  Francisco [name] stated that pt needs to keep his follow up with infectious disease.  Called and spoke with pt letting him know the info stated by Francisco [name]. Stated that he needs to keep appt as scheduled with ID 7/15. Stated that he needs to see MR for follow up as scheduled. Pt verbalized understanding. Nothing further needed.

## 2020-01-17 ENCOUNTER — Other Ambulatory Visit: Payer: Self-pay

## 2020-01-17 ENCOUNTER — Telehealth: Payer: Self-pay

## 2020-01-17 NOTE — Telephone Encounter (Signed)
Pt's wife called to confirm pt's procedure for Friday has been cx. Postponed until his breathing has improved per pulmonology note. Pt currently taking Tramadol q12h. Wife said he may run out in the near future. She will call us back if that happens and his pain is not under control.

## 2020-01-18 ENCOUNTER — Other Ambulatory Visit: Payer: Self-pay

## 2020-01-18 ENCOUNTER — Ambulatory Visit (INDEPENDENT_AMBULATORY_CARE_PROVIDER_SITE_OTHER): Payer: Medicare Other | Admitting: Internal Medicine

## 2020-01-18 ENCOUNTER — Encounter: Payer: Self-pay | Admitting: Internal Medicine

## 2020-01-18 DIAGNOSIS — J209 Acute bronchitis, unspecified: Secondary | ICD-10-CM | POA: Diagnosis not present

## 2020-01-18 DIAGNOSIS — J42 Unspecified chronic bronchitis: Secondary | ICD-10-CM

## 2020-01-18 DIAGNOSIS — I739 Peripheral vascular disease, unspecified: Secondary | ICD-10-CM | POA: Diagnosis not present

## 2020-01-18 LAB — FUNGUS CULTURE W SMEAR
MICRO NUMBER:: 10592820
SMEAR:: NONE SEEN
SPECIMEN QUALITY:: ADEQUATE

## 2020-01-18 LAB — RESPIRATORY CULTURE OR RESPIRATORY AND SPUTUM CULTURE
MICRO NUMBER:: 10592821
SPECIMEN QUALITY:: ADEQUATE

## 2020-01-18 NOTE — Telephone Encounter (Signed)
Triage please contact the patient per protocol and as he is requesting - routing to a provider with no patient contact is not appropriate   Francisco Quaker FNP

## 2020-01-18 NOTE — Progress Notes (Signed)
[facility] for Infectious Disease  Patient Active Problem List   Diagnosis Date Noted   Acute exacerbation of chronic bronchitis ([place]) 11/30/2019    Priority: High   Stage 4 very severe COPD by GOLD classification ([place]) 11/27/2018    Priority: High   PAD (peripheral artery disease) ([place]) 12/11/2018   Pre-operative respiratory examination 12/10/2018   COPD with acute exacerbation ([place]) 11/27/2018   DNR (do not resuscitate) 73/22/0254   Diastolic dysfunction 27/12/2374   Chronic pulmonary embolism ([place]) 04/07/2018   Essential tremor 03/31/2018   BPPV (benign paroxysmal positional vertigo), right 02/10/2018   Osteoporosis 11/19/2017   Syncope 11/02/2017   Thoracic compression fracture ([place]) 11/02/2017   Essential hypertension 11/02/2017   Venous stasis dermatitis of both lower extremities 11/02/2017   Chronic respiratory failure with hypoxia ([place]) 09/23/2017   BPH associated with nocturia 06/25/2017   Leukocytosis 12/19/2016   Hyperglycemia 06/12/2016   Hyperlipidemia 07/22/2014   Onychomycosis 07/22/2014   Left ankle swelling 07/22/2014   History of skin cancer 05/17/2014   Chronic rhinitis 10/28/2012   Pulmonary nodule 09/23/2011   Former smoker 08/23/2008   COPD (chronic obstructive pulmonary disease) with emphysema ([place]) 08/23/2008   History of colonic polyps 08/23/2008    Patient's Medications  New Prescriptions   No medications on file  Previous Medications   ACETAMINOPHEN (TYLENOL) 500 MG TABLET    Take 1,000 mg by mouth every 6 (six) hours as needed for moderate pain.    ALBUTEROL (VENTOLIN HFA) 108 (90 BASE) MCG/ACT INHALER    USE 2 PUFFS EVERY 6 HOURS  AS NEEDED FOR WHEEZING   AZITHROMYCIN (ZITHROMAX) 250 MG TABLET    Take 1 tablet (250 mg total) by mouth every Monday, Wednesday, and Friday.   BUDESON-GLYCOPYRROL-FORMOTEROL (BREZTRI AEROSPHERE) 160-9-4.8 MCG/ACT AERO    Inhale 2 puffs into the lungs 2 (two) times daily.     BUDESON-GLYCOPYRROL-FORMOTEROL (BREZTRI AEROSPHERE) 160-9-4.8 MCG/ACT AERO    Inhale 2 puffs into the lungs in the morning and at bedtime. Rinse mouth after each use.   BUDESONIDE-FORMOTEROL (SYMBICORT) 160-4.5 MCG/ACT INHALER    Inhale 2 puffs into the lungs 2 (two) times daily.   CALCIUM CARBONATE-VITAMIN D (CALCIUM 600+D PO)    Take 2 tablets by mouth daily.   CYCLOSPORINE (RESTASIS) 0.05 % OPHTHALMIC EMULSION    Place 1 drop into both eyes 2 (two) times daily as needed (dry eyes).    FLUTICASONE (FLONASE) 50 MCG/ACT NASAL SPRAY    Place 2 sprays into both nostrils daily.   FUROSEMIDE (LASIX) 20 MG TABLET    TAKE 1 TABLET BY MOUTH  DAILY   OXYGEN    Inhale 3 L into the lungs continuous.    PREDNISONE (DELTASONE) 10 MG TABLET    Take 1 tablet (10 mg total) by mouth daily with breakfast.   PREDNISONE (DELTASONE) 10 MG TABLET    Take 3 tablets (30 mg total) by mouth daily with breakfast for 10 days, THEN 2 tablets (20 mg total) daily with breakfast for 10 days, THEN 1 tablet (10 mg total) daily with breakfast for 10 days. Take 2 tablets (20mg  total) daily for the next 5 days. Take in the AM with food..   RESPIRATORY THERAPY SUPPLIES (FLUTTER) DEVI    10 times Twice a day and prn as needed, may increase if feeling worse   RIVAROXABAN (XARELTO) 20 MG TABS TABLET    TAKE 1 TABLET BY MOUTH  DAILY   ROFLUMILAST (DALIRESP) 500  MCG TABS TABLET    Take 1 tablet (500 mcg total) by mouth daily.   SODIUM CHLORIDE HYPERTONIC 3 % NEBULIZER SOLUTION    Take by nebulization daily.   SPIRIVA RESPIMAT 2.5 MCG/ACT AERS    USE 2 INHALATIONS BY MOUTH  ONCE DAILY   TRAMADOL (ULTRAM) 50 MG TABLET    Take 1 tablet (50 mg total) by mouth every 12 (twelve) hours as needed for moderate pain or severe pain.  Modified Medications   No medications on file  Discontinued Medications   CEFDINIR (OMNICEF) 300 MG CAPSULE    Take 1 capsule (300 mg total) by mouth 2 (two) times daily.   CEFEPIME (MAXIPIME) 2 G INJECTION    Inject  2 g into the vein every 8 (eight) hours.    Subjective: Vint is in for his routine follow-up visit.  When I first saw him in late May he had been having increasingly more frequent acute exacerbations of his chronic bronchitis.  He is O2 dependent at home.  A culture on 11/15/2019 grew Pseudomonas.  I elected to give him a 2-week course of IV cefepime which she started on 12/03/2019.  He says that he did not notice much improvement in his chronic cough or shortness of breath.  A follow-up sputum culture in early June showed only normal oral flora.  He was recultured again on 12/21/2019 and it showed mixed gram-negative growth and Candida albicans.  His AFB stain was negative.  He just recently completed a course of Omnicef and has been placed on steroids.  He says that his breathing is much improved since going back on prednisone.  He says that his #1 problem now is his left foot.  He recently began to develop discoloration of his left toes with rest pain.  He has been evaluated by vascular surgery and is hoping to have that done later this week.  Review of Systems: Review of Systems  Constitutional: Negative for chills, diaphoresis and fever.  Respiratory: Positive for cough, sputum production and shortness of breath.        He feels like his pulmonary symptoms and oxygenation is at his baseline.  Cardiovascular: Negative for chest pain.  Musculoskeletal: Positive for joint pain.    Past Medical History:  Diagnosis Date   Chest x-ray abnormality    Chronic back pain    "mid and lower" (04/07/2018)   Chronic rhinitis    -Sinus Ct 08/01/2009 >> Bilateral maxillary sinusitis with some mucosal thickeningin the sphenoid and frontal sinuses as well with air fluid levels present -chronic rhinitis flyer Aug 04, 2009   Compressed spine fracture [facility])    COPD (chronic obstructive pulmonary disease) (H[facility])    PFT's rec Jul 17, 2009   Dyspnea    Emphysema lung ([facility])    Emphysema of lung ([place])     On home oxygen therapy    "3L; 24/7" (04/07/2018)   Onychomycosis    Dr. Judi Cong   Orthostatic hypotension    "since 10/2017" (04/07/2018)   PAD (peripheral artery disease) ([place])    Pneumonia    "twice in 1 year" (04/07/2018)   Pulmonary embolism ([place]) 04/07/2018   Skin cancer    "lips, face, ears, arms" (04/07/2018)   Vertigo    "since ~ 02/2018" (04/07/2018)    Social History   Tobacco Use   Smoking status: Former Smoker    Packs/day: 2.00    Years: 52.00    Pack years: 104.00    Types: Pipe, Cigarettes  Start date: 10/06/1965    Quit date: 10/06/2017    Years since quitting: 2.2   Smokeless tobacco: Never Used   Tobacco comment: 04/07/2018 "smoked cigarettes years ago; stopped ~ 20 yr ago; stopped smoking pipe in 09/2017"  Vaping Use   Vaping Use: Never used  Substance Use Topics   Alcohol use: Yes    Alcohol/week: 28.0 standard drinks    Types: 28 Cans of beer per week    Comment: "04/15/2019 "4 beers/day; probably closer to 28/wk"   Drug use: No    Family History  Problem Relation Age of Onset   Heart disease Mother        CABG in her 49s, nonsmoker   Cancer Mother    Stroke Father    Heart disease Father        Died of MI at age 67, smoker   Hepatitis Sister    Coronary artery disease Other        male 1st degree relative <60    Allergies  Allergen Reactions   Tape Other (See Comments)    SKIN IS VERY THIN, TEARS SKIN; CAN ONLY USE COBAN WRAPS DUE TO CONDITION OF SKIN!!   Ciprofloxacin Nausea Only    Sick on stomach, weak/tired   Levaquin [Levofloxacin]     hallucinations    Objective: Vitals:   01/18/20 1529  BP: 120/80  Pulse: 91  SpO2: 97%   There is no height or weight on file to calculate BMI.  Physical Exam Constitutional:      Comments: He is seated in his wheelchair.  He is in good spirits.  Cardiovascular:     Rate and Rhythm: Normal rate and regular rhythm.     Heart sounds: No murmur heard.   Pulmonary:      Effort: Pulmonary effort is normal.     Breath sounds: Normal breath sounds. No wheezing, rhonchi or rales.  Musculoskeletal:     Right lower leg: Edema present.     Left lower leg: Edema present.     Comments: He has discoloration of the toes on his left foot.  Psychiatric:        Mood and Affect: Mood normal.       Lab Results    Problem List Items Addressed This Visit      High   Acute exacerbation of chronic bronchitis ([place])    I do not think that there is much else that can be done at this point to improve his pulmonary function.  He feels like he is doing much better back on higher doses of prednisone.  I do not see any indication to try another round of antibiotic therapy.        Unprioritized   PAD (peripheral artery disease) ([place])    He needs a revascularization procedure on his left leg.          Michel Bickers, MD [facility] for Infectious [facility] [phone] pager   [phone] cell 01/18/2020, 5:35 PM

## 2020-01-18 NOTE — Assessment & Plan Note (Signed)
He needs a revascularization procedure on his left leg.

## 2020-01-18 NOTE — Telephone Encounter (Addendum)
Spoke with pt, he is wanting to know what is holding up his surgical clearance because he is in a lot of pain and wants to get the foot procedure done that was postponed. [name] Kitchen He was advised to see [name] and doesn't have an appt until 8/16 and cannot wait that long with the pain he is enduring.  He has an appt with Dr. Megan [name] (Infectious disease) today and wants to know if we are waiting on this appointment? It was an agreement between [place] and his foot doctor to postpone the procedure until further notice. Looking back in the notes, we are waiting for pt to see Dr. Megan [name] to determine if his breathing is better. Pt understood and nothing further is needed.   Pinion, Francisco [name], CMA     01/14/20 12:47 PM Note   Spoke with Francisco [name] who spoke with Dr. Scot [name]. Per Dr. Scot [name], Vascular is going to hold off on the surgery until we have seen clinical improvement with breathing.  Francisco [name] stated that pt needs to keep his follow up with infectious disease.  Called and spoke with pt letting him know the info stated by Francisco [name]. Stated that he needs to keep appt as scheduled with ID 7/15. Stated that he needs to see MR for follow up as scheduled. Pt verbalized understanding. Nothing further needed.

## 2020-01-18 NOTE — Assessment & Plan Note (Signed)
I do not think that there is much else that can be done at this point to improve his pulmonary function.  He feels like he is doing much better back on higher doses of prednisone.  I do not see any indication to try another round of antibiotic therapy.

## 2020-01-20 ENCOUNTER — Ambulatory Visit: Payer: Medicare Other | Admitting: Internal Medicine

## 2020-01-21 ENCOUNTER — Inpatient Hospital Stay (HOSPITAL_COMMUNITY)
Admission: RE | Admit: 2020-01-21 | Discharge: 2020-01-31 | DRG: 252 | Disposition: A | Discharge status: Home Health | Payer: Medicare Other | Attending: Vascular Surgery | Admitting: Vascular Surgery

## 2020-01-21 ENCOUNTER — Encounter (HOSPITAL_COMMUNITY)
Admission: RE | Disposition: A | Discharge status: Home Health | Payer: Self-pay | Source: Home / Self Care | Attending: Vascular Surgery

## 2020-01-21 ENCOUNTER — Encounter (HOSPITAL_COMMUNITY): Payer: Self-pay

## 2020-01-21 ENCOUNTER — Other Ambulatory Visit: Payer: Self-pay

## 2020-01-21 ENCOUNTER — Ambulatory Visit (HOSPITAL_COMMUNITY): Admit: 2020-01-21 | Payer: Medicare Other | Admitting: Vascular Surgery

## 2020-01-21 DIAGNOSIS — R0600 Dyspnea, unspecified: Secondary | ICD-10-CM

## 2020-01-21 DIAGNOSIS — Z9981 Dependence on supplemental oxygen: Secondary | ICD-10-CM | POA: Diagnosis not present

## 2020-01-21 DIAGNOSIS — Z79899 Other long term (current) drug therapy: Secondary | ICD-10-CM

## 2020-01-21 DIAGNOSIS — Z20822 Contact with and (suspected) exposure to covid-19: Secondary | ICD-10-CM | POA: Diagnosis present

## 2020-01-21 DIAGNOSIS — Z7952 Long term (current) use of systemic steroids: Secondary | ICD-10-CM | POA: Diagnosis not present

## 2020-01-21 DIAGNOSIS — J151 Pneumonia due to Pseudomonas: Secondary | ICD-10-CM | POA: Diagnosis not present

## 2020-01-21 DIAGNOSIS — Z91048 Other nonmedicinal substance allergy status: Secondary | ICD-10-CM | POA: Diagnosis not present

## 2020-01-21 DIAGNOSIS — I70262 Atherosclerosis of native arteries of extremities with gangrene, left leg: Secondary | ICD-10-CM | POA: Diagnosis present

## 2020-01-21 DIAGNOSIS — Z7951 Long term (current) use of inhaled steroids: Secondary | ICD-10-CM

## 2020-01-21 DIAGNOSIS — J439 Emphysema, unspecified: Secondary | ICD-10-CM | POA: Diagnosis present

## 2020-01-21 DIAGNOSIS — Z8249 Family history of ischemic heart disease and other diseases of the circulatory system: Secondary | ICD-10-CM | POA: Diagnosis not present

## 2020-01-21 DIAGNOSIS — G8929 Other chronic pain: Secondary | ICD-10-CM | POA: Diagnosis present

## 2020-01-21 DIAGNOSIS — J90 Pleural effusion, not elsewhere classified: Secondary | ICD-10-CM | POA: Diagnosis present

## 2020-01-21 DIAGNOSIS — Z881 Allergy status to other antibiotic agents status: Secondary | ICD-10-CM | POA: Diagnosis not present

## 2020-01-21 DIAGNOSIS — K5900 Constipation, unspecified: Secondary | ICD-10-CM | POA: Diagnosis not present

## 2020-01-21 DIAGNOSIS — Z888 Allergy status to other drugs, medicaments and biological substances status: Secondary | ICD-10-CM

## 2020-01-21 DIAGNOSIS — M549 Dorsalgia, unspecified: Secondary | ICD-10-CM | POA: Diagnosis present

## 2020-01-21 DIAGNOSIS — J9621 Acute and chronic respiratory failure with hypoxia: Secondary | ICD-10-CM | POA: Diagnosis not present

## 2020-01-21 DIAGNOSIS — D62 Acute posthemorrhagic anemia: Secondary | ICD-10-CM | POA: Diagnosis not present

## 2020-01-21 DIAGNOSIS — Z85828 Personal history of other malignant neoplasm of skin: Secondary | ICD-10-CM

## 2020-01-21 DIAGNOSIS — L299 Pruritus, unspecified: Secondary | ICD-10-CM | POA: Diagnosis not present

## 2020-01-21 DIAGNOSIS — J9811 Atelectasis: Secondary | ICD-10-CM | POA: Diagnosis present

## 2020-01-21 DIAGNOSIS — Z86711 Personal history of pulmonary embolism: Secondary | ICD-10-CM

## 2020-01-21 DIAGNOSIS — Z823 Family history of stroke: Secondary | ICD-10-CM

## 2020-01-21 DIAGNOSIS — I10 Essential (primary) hypertension: Secondary | ICD-10-CM | POA: Diagnosis present

## 2020-01-21 DIAGNOSIS — I998 Other disorder of circulatory system: Secondary | ICD-10-CM | POA: Diagnosis not present

## 2020-01-21 DIAGNOSIS — J42 Unspecified chronic bronchitis: Secondary | ICD-10-CM | POA: Diagnosis not present

## 2020-01-21 DIAGNOSIS — I739 Peripheral vascular disease, unspecified: Secondary | ICD-10-CM | POA: Diagnosis not present

## 2020-01-21 DIAGNOSIS — Z87891 Personal history of nicotine dependence: Secondary | ICD-10-CM | POA: Diagnosis not present

## 2020-01-21 DIAGNOSIS — I70222 Atherosclerosis of native arteries of extremities with rest pain, left leg: Secondary | ICD-10-CM

## 2020-01-21 DIAGNOSIS — R000 Tachycardia, unspecified: Secondary | ICD-10-CM | POA: Diagnosis not present

## 2020-01-21 DIAGNOSIS — I878 Other specified disorders of veins: Secondary | ICD-10-CM | POA: Diagnosis present

## 2020-01-21 DIAGNOSIS — Z7901 Long term (current) use of anticoagulants: Secondary | ICD-10-CM

## 2020-01-21 DIAGNOSIS — R0603 Acute respiratory distress: Secondary | ICD-10-CM

## 2020-01-21 DIAGNOSIS — L570 Actinic keratosis: Secondary | ICD-10-CM | POA: Diagnosis present

## 2020-01-21 HISTORY — PX: ABDOMINAL AORTOGRAM W/LOWER EXTREMITY: CATH118223

## 2020-01-21 LAB — CBC
HCT: 30.6 % — ABNORMAL LOW (ref 39.0–52.0)
Hemoglobin: 9 g/dL — ABNORMAL LOW (ref 13.0–17.0)
MCH: 25.4 pg — ABNORMAL LOW (ref 26.0–34.0)
MCHC: 29.4 g/dL — ABNORMAL LOW (ref 30.0–36.0)
MCV: 86.4 fL (ref 80.0–100.0)
Platelets: 223 10*3/uL (ref 150–400)
RBC: 3.54 MIL/uL — ABNORMAL LOW (ref 4.22–5.81)
RDW: 16.3 % — ABNORMAL HIGH (ref 11.5–15.5)
WBC: 13.3 10*3/uL — ABNORMAL HIGH (ref 4.0–10.5)
nRBC: 0 % (ref 0.0–0.2)

## 2020-01-21 LAB — POCT I-STAT, CHEM 8
BUN: 19 mg/dL (ref 8–23)
Calcium, Ion: 1.23 mmol/L (ref 1.15–1.40)
Chloride: 100 mmol/L (ref 98–111)
Creatinine, Ser: 1 mg/dL (ref 0.61–1.24)
Glucose, Bld: 101 mg/dL — ABNORMAL HIGH (ref 70–99)
HCT: 35 % — ABNORMAL LOW (ref 39.0–52.0)
Hemoglobin: 11.9 g/dL — ABNORMAL LOW (ref 13.0–17.0)
Potassium: 4.7 mmol/L (ref 3.5–5.1)
Sodium: 140 mmol/L (ref 135–145)
TCO2: 29 mmol/L (ref 22–32)

## 2020-01-21 LAB — CREATININE, SERUM
Creatinine, Ser: 1.12 mg/dL (ref 0.61–1.24)
GFR calc Af Amer: >60 mL/min (ref >60)
GFR calc non Af Amer: >60 mL/min (ref >60)

## 2020-01-21 SURGERY — ABDOMINAL AORTOGRAM W/LOWER EXTREMITY
Anesthesia: LOCAL

## 2020-01-21 MED ORDER — MORPHINE SULFATE (PF) 2 MG/ML IV SOLN: 2.0000 mg | Freq: Once | INTRAVENOUS | Status: DC

## 2020-01-21 MED ORDER — ALBUTEROL SULFATE HFA 108 (90 BASE) MCG/ACT IN AERS
2.0000 | INHALATION_SPRAY | Freq: Four times a day (QID) | RESPIRATORY_TRACT | Status: DC | PRN
  Administered 2020-01-24 – 2020-01-27 (×5): 2 via RESPIRATORY_TRACT

## 2020-01-21 MED ORDER — LABETALOL HCL 5 MG/ML IV SOLN
INTRAVENOUS | Status: AC
  Filled 2020-01-21: qty 4

## 2020-01-21 MED ORDER — CYCLOSPORINE 0.05 % OP EMUL
1.0000 [drp] | Freq: Two times a day (BID) | OPHTHALMIC | Status: DC | PRN
  Filled 2020-01-21: qty 1

## 2020-01-21 MED ORDER — HEPARIN SODIUM (PORCINE) 5000 UNIT/ML IJ SOLN
5000.0000 [IU] | Freq: Three times a day (TID) | INTRAMUSCULAR | Status: DC
  Administered 2020-01-21 – 2020-01-24 (×8): 5000 [IU] via SUBCUTANEOUS
  Filled 2020-01-21 (×8): qty 1

## 2020-01-21 MED ORDER — FLUTICASONE PROPIONATE 50 MCG/ACT NA SUSP
2.0000 | Freq: Every day | NASAL | Status: DC
  Administered 2020-01-22 – 2020-01-31 (×10): 2 via NASAL
  Filled 2020-01-21: qty 16

## 2020-01-21 MED ORDER — MORPHINE SULFATE (PF) 2 MG/ML IV SOLN
INTRAVENOUS | Status: AC
  Filled 2020-01-21: qty 1

## 2020-01-21 MED ORDER — LABETALOL HCL 5 MG/ML IV SOLN: 10.0000 mg | INTRAVENOUS | Status: DC | PRN

## 2020-01-21 MED ORDER — LABETALOL HCL 5 MG/ML IV SOLN
10.0000 mg | INTRAVENOUS | Status: DC | PRN
  Administered 2020-01-21: 10 mg via INTRAVENOUS

## 2020-01-21 MED ORDER — GUAIFENESIN-DM 100-10 MG/5ML PO SYRP
15.0000 mL | ORAL_SOLUTION | ORAL | Status: DC | PRN
  Administered 2020-01-26 – 2020-01-27 (×2): 15 mL via ORAL
  Filled 2020-01-21 (×2): qty 15

## 2020-01-21 MED ORDER — CALCIUM CARBONATE-VITAMIN D 600-200 MG-UNIT PO TABS: ORAL_TABLET | Freq: Every day | ORAL | Status: DC

## 2020-01-21 MED ORDER — SODIUM CHLORIDE 0.9 % IV SOLN: 250.0000 mL | INTRAVENOUS | Status: DC | PRN

## 2020-01-21 MED ORDER — MORPHINE SULFATE (PF) 2 MG/ML IV SOLN
2.0000 mg | Freq: Once | INTRAVENOUS | Status: AC
  Administered 2020-01-21: 2 mg via INTRAVENOUS

## 2020-01-21 MED ORDER — POTASSIUM CHLORIDE CRYS ER 20 MEQ PO TBCR
20.0000 meq | EXTENDED_RELEASE_TABLET | Freq: Once | ORAL | Status: AC
  Administered 2020-01-21: 40 meq via ORAL
  Filled 2020-01-21 (×2): qty 2

## 2020-01-21 MED ORDER — OXYCODONE HCL 5 MG PO TABS
5.0000 mg | ORAL_TABLET | ORAL | Status: DC | PRN
  Administered 2020-01-21 – 2020-01-27 (×23): 10 mg via ORAL
  Administered 2020-01-27: 5 mg via ORAL
  Administered 2020-01-27 – 2020-01-29 (×5): 10 mg via ORAL
  Filled 2020-01-21 (×13): qty 2
  Filled 2020-01-21: qty 1
  Filled 2020-01-21 (×16): qty 2

## 2020-01-21 MED ORDER — PHENOL 1.4 % MT LIQD: 1.0000 | OROMUCOSAL | Status: DC | PRN

## 2020-01-21 MED ORDER — SODIUM CHLORIDE 0.9% FLUSH
3.0000 mL | INTRAVENOUS | Status: DC | PRN
  Administered 2020-01-29: 3 mL via INTRAVENOUS

## 2020-01-21 MED ORDER — SODIUM CHLORIDE 0.9 % IV SOLN: INTRAVENOUS | Status: AC

## 2020-01-21 MED ORDER — HEPARIN (PORCINE) IN NACL 1000-0.9 UT/500ML-% IV SOLN
INTRAVENOUS | Status: DC | PRN
  Administered 2020-01-21 (×2): 500 mL

## 2020-01-21 MED ORDER — MORPHINE SULFATE (PF) 2 MG/ML IV SOLN
2.0000 mg | INTRAVENOUS | Status: DC | PRN
  Administered 2020-01-21 – 2020-01-24 (×5): 2 mg via INTRAVENOUS
  Filled 2020-01-21 (×4): qty 1

## 2020-01-21 MED ORDER — ONDANSETRON HCL 4 MG/2ML IJ SOLN
4.0000 mg | Freq: Four times a day (QID) | INTRAMUSCULAR | Status: DC | PRN
Start: 2020-01-21 — End: 2020-01-21

## 2020-01-21 MED ORDER — SODIUM CHLORIDE 0.9% FLUSH
3.0000 mL | Freq: Two times a day (BID) | INTRAVENOUS | Status: DC
  Administered 2020-01-22 – 2020-01-30 (×11): 3 mL via INTRAVENOUS

## 2020-01-21 MED ORDER — FUROSEMIDE 20 MG PO TABS
20.0000 mg | ORAL_TABLET | Freq: Every day | ORAL | Status: DC
  Administered 2020-01-21 – 2020-01-24 (×4): 20 mg via ORAL
  Filled 2020-01-21 (×5): qty 1

## 2020-01-21 MED ORDER — ROFLUMILAST 500 MCG PO TABS
500.0000 ug | ORAL_TABLET | Freq: Every day | ORAL | Status: DC
  Administered 2020-01-22 – 2020-01-31 (×10): 500 ug via ORAL
  Filled 2020-01-21 (×11): qty 1

## 2020-01-21 MED ORDER — HYDRALAZINE HCL 20 MG/ML IJ SOLN: 5.0000 mg | INTRAMUSCULAR | Status: DC | PRN

## 2020-01-21 MED ORDER — HEPARIN (PORCINE) IN NACL 1000-0.9 UT/500ML-% IV SOLN
INTRAVENOUS | Status: AC
  Filled 2020-01-21: qty 1000

## 2020-01-21 MED ORDER — SODIUM CHLORIDE 0.9 % IV SOLN: INTRAVENOUS | Status: DC

## 2020-01-21 MED ORDER — CALCIUM CARBONATE-VITAMIN D 500-200 MG-UNIT PO TABS
1.0000 | ORAL_TABLET | Freq: Every day | ORAL | Status: DC
  Administered 2020-01-22 – 2020-01-31 (×10): 1 via ORAL
  Filled 2020-01-21 (×10): qty 1

## 2020-01-21 MED ORDER — HYDRALAZINE HCL 20 MG/ML IJ SOLN
5.0000 mg | INTRAMUSCULAR | Status: DC | PRN
Start: 2020-01-21 — End: 2020-01-21

## 2020-01-21 MED ORDER — MORPHINE SULFATE (PF) 2 MG/ML IV SOLN
INTRAVENOUS | Status: DC | PRN
  Administered 2020-01-21: 2 mg via INTRAVENOUS

## 2020-01-21 MED ORDER — ONDANSETRON HCL 4 MG/2ML IJ SOLN: 4.0000 mg | Freq: Four times a day (QID) | INTRAMUSCULAR | Status: DC | PRN

## 2020-01-21 MED ORDER — BUDESON-GLYCOPYRROL-FORMOTEROL 160-9-4.8 MCG/ACT IN AERO
2.0000 | INHALATION_SPRAY | Freq: Two times a day (BID) | RESPIRATORY_TRACT | Status: DC
  Administered 2020-01-21 – 2020-01-22 (×2): 2 via RESPIRATORY_TRACT

## 2020-01-21 MED ORDER — IODIXANOL 320 MG/ML IV SOLN
INTRAVENOUS | Status: DC | PRN
  Administered 2020-01-21: 165 mL via INTRA_ARTERIAL

## 2020-01-21 MED ORDER — LIDOCAINE HCL (PF) 1 % IJ SOLN
INTRAMUSCULAR | Status: DC | PRN
  Administered 2020-01-21: 15 mL via INTRADERMAL

## 2020-01-21 MED ORDER — MOMETASONE FURO-FORMOTEROL FUM 200-5 MCG/ACT IN AERO: 2.0000 | INHALATION_SPRAY | Freq: Two times a day (BID) | RESPIRATORY_TRACT | Status: DC

## 2020-01-21 MED ORDER — LIDOCAINE HCL (PF) 1 % IJ SOLN
INTRAMUSCULAR | Status: AC
  Filled 2020-01-21: qty 30

## 2020-01-21 MED ORDER — OXYCODONE HCL 5 MG PO TABS
ORAL_TABLET | ORAL | Status: AC
  Filled 2020-01-21: qty 2

## 2020-01-21 MED ORDER — PANTOPRAZOLE SODIUM 40 MG PO TBEC
40.0000 mg | DELAYED_RELEASE_TABLET | Freq: Every day | ORAL | Status: DC
  Administered 2020-01-21 – 2020-01-31 (×11): 40 mg via ORAL
  Filled 2020-01-21 (×12): qty 1

## 2020-01-21 MED ORDER — ALUM & MAG HYDROXIDE-SIMETH 200-200-20 MG/5ML PO SUSP: 15.0000 mL | ORAL | Status: DC | PRN

## 2020-01-21 MED ORDER — PREDNISONE 10 MG PO TABS
10.0000 mg | ORAL_TABLET | Freq: Every day | ORAL | Status: DC
  Administered 2020-01-22 – 2020-01-31 (×10): 10 mg via ORAL
  Filled 2020-01-21 (×12): qty 1

## 2020-01-21 MED ORDER — ACETAMINOPHEN 325 MG PO TABS: 650.0000 mg | ORAL_TABLET | ORAL | Status: DC | PRN

## 2020-01-21 MED ORDER — AZITHROMYCIN 250 MG PO TABS
250.0000 mg | ORAL_TABLET | ORAL | Status: DC
  Administered 2020-01-21 – 2020-01-31 (×6): 250 mg via ORAL
  Filled 2020-01-21 (×7): qty 1

## 2020-01-21 MED ORDER — MORPHINE SULFATE (PF) 4 MG/ML IV SOLN
INTRAVENOUS | Status: AC
  Filled 2020-01-21: qty 1

## 2020-01-21 MED ORDER — DOCUSATE SODIUM 100 MG PO CAPS
100.0000 mg | ORAL_CAPSULE | Freq: Two times a day (BID) | ORAL | Status: DC
  Administered 2020-01-21 – 2020-01-26 (×9): 100 mg via ORAL
  Filled 2020-01-21 (×9): qty 1

## 2020-01-21 MED ORDER — METOPROLOL TARTRATE 5 MG/5ML IV SOLN
2.0000 mg | INTRAVENOUS | Status: AC | PRN
  Administered 2020-01-25: 2 mg via INTRAVENOUS
  Administered 2020-01-26: 5 mg via INTRAVENOUS
  Filled 2020-01-21 (×3): qty 5

## 2020-01-21 SURGICAL SUPPLY — 8 items
CATH ANGIO 5F PIGTAIL 65CM (CATHETERS) ×1 IMPLANT
KIT PV (KITS) ×2 IMPLANT
SHEATH PINNACLE 5F 10CM (SHEATH) ×1 IMPLANT
SHEATH PROBE COVER 6X72 (BAG) ×1 IMPLANT
SYR MEDRAD MARK V 150ML (SYRINGE) ×1 IMPLANT
TRANSDUCER W/STOPCOCK (MISCELLANEOUS) ×2 IMPLANT
TRAY PV CATH (CUSTOM PROCEDURE TRAY) ×2 IMPLANT
WIRE HITORQ VERSACORE ST 145CM (WIRE) ×1 IMPLANT

## 2020-01-21 NOTE — Progress Notes (Signed)
   01/21/20 1946  Assess: MEWS Score  Temp 97.9 F (36.6 C)  BP (!) 147/43  Pulse Rate (!) 117  ECG Heart Rate (!) 118  Resp 17  SpO2 96 %  Assess: MEWS Score  MEWS Temp 0  MEWS Systolic 0  MEWS Pulse 2  MEWS RR 0  MEWS LOC 0  MEWS Score 2  MEWS Score Color Yellow  Assess: if the MEWS score is Yellow or Red  Were vital signs taken at a resting state? Yes  Focused Assessment Documented focused assessment  Early Detection of Sepsis Score *See Row Information* Low  MEWS guidelines implemented *See Row Information* No, previously yellow, continue vital signs every 4 hours  Treat  MEWS Interventions Administered scheduled meds/treatments  Take Vital Signs  Increase Vital Sign Frequency  Yellow: Q 2hr X 2 then Q 4hr X 2, if remains yellow, continue Q 4hrs  Escalate  MEWS: Escalate Yellow: discuss with charge nurse/RN and consider discussing with provider and RRT  Notify: Charge Nurse/RN  Name of Charge Nurse/RN Notified Cordella Register., RN  Date Charge Nurse/RN Notified 01/21/20  Time Charge Nurse/RN Notified 2100

## 2020-01-21 NOTE — Op Note (Signed)
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  The patient was placed in supine position on the angio table.  Both groins were prepped and draped in usual sterile fashion.  Ultrasound was used to identify the right common femoral artery and femoral bifurcation.  Local anesthesia was infiltrated over the right common femoral artery.  Ultrasound was used to cannulate the right common femoral artery and an 035 Bentson wire advanced into the abdominal aorta under fluoroscopic guidance.  Next a 5 French sheath placed over the guidewire in the right common femoral artery.  This was thoroughly flushed with heparinized saline.  A 5 French pigtail catheter was then advanced over the guidewire into the abdominal aorta and abdominal aortogram obtained in AP projection.  The infrarenal abdominal aorta is patent.  Left and right renal arteries are patent.  The left and right common and internal iliac arteries are patent.  The left external iliac artery is occluded throughout its course.  The right external iliac artery is patent there is about a 50% narrowing a few centimeters above the inguinal ligament.  Bilateral oblique views of the pelvis were obtained after pulling the pigtail catheter down just above the aortic bifurcation which confirmed the above findings.  Next left lower extremity views were obtained through the pigtail catheter.  The left common femoral artery does opacify just distal to the occlusion and the profunda femoris and superficial femoral arteries are  patent with three-vessel runoff to the left foot.  At this point the pigtail catheter was removed and right lower extremity views were obtained through the sheath.  This again shows a 50% narrowing of the distal external iliac artery above the previous right common femoral endarterectomy site.  The profunda femoris and superficial femoral arteries are patent.  There is three-vessel runoff to the right foot.  At this point the procedure was concluded the 5 French sheath was left in place to be pulled the holding area.  The patient tolerated procedure well and there were no complications.  Patient was taken the holding area in stable condition.  Operative management: The patient will be scheduled for a left common femoral endarterectomy possible left external iliac stent possible right to left femoral-femoral bypass on Monday.  This will be on January 24, 2020.  Risk benefits possible complications and procedure details were discussed with the patient and his wife today.  These include but are not limited to bleeding infection limb loss possible pneumonia possible ventilator dependence possible myocardial events possible death.  They agree to proceed.  He will be high risk from a pulmonary standpoint.  We will try to do this with regional anesthesia.  He will be admitted today for pain control.  Ruta Hinds, MD [facility] Office: [phone]

## 2020-01-21 NOTE — Interval H&P Note (Signed)
History and Physical Interval Note:  01/21/2020 10:17 AM  Francisco [name]  has presented today for surgery, with the diagnosis of pad.  The various methods of treatment have been discussed with the patient and family. After consideration of risks, benefits and other options for treatment, the patient has consented to  Procedure(s): ABDOMINAL AORTOGRAM W/LOWER EXTREMITY (N/A) as a surgical intervention.  The patient's history has been reviewed, patient examined, no change in status, stable for surgery.  I have reviewed the patient's chart and labs.  Questions were answered to the patient's satisfaction.     Ruta Hinds

## 2020-01-21 NOTE — Progress Notes (Signed)
Site area: Right groin a 5 french arterial sheath was removed  Site Prior to Removal:  Level 0  Pressure Applied For 30 MINUTES    Bedrest Beginning at 1435pm  Manual:   Yes.    Patient Status During Pull:  stable  Post Pull Groin Site:  Level 0  Post Pull Instructions Given:  Yes.    Post Pull Pulses Present:  Yes.    Dressing Applied:  Yes.    Comments:

## 2020-01-22 LAB — COMPREHENSIVE METABOLIC PANEL
ALT: 40 U/L (ref 0–44)
AST: 25 U/L (ref 15–41)
Albumin: 2.6 g/dL — ABNORMAL LOW (ref 3.5–5.0)
Alkaline Phosphatase: 113 U/L (ref 38–126)
Anion gap: 10 (ref 5–15)
BUN: 12 mg/dL (ref 8–23)
CO2: 26 mmol/L (ref 22–32)
Calcium: 8.7 mg/dL — ABNORMAL LOW (ref 8.9–10.3)
Chloride: 101 mmol/L (ref 98–111)
Creatinine, Ser: 1.04 mg/dL (ref 0.61–1.24)
GFR calc Af Amer: >60 mL/min (ref >60)
GFR calc non Af Amer: >60 mL/min (ref >60)
Glucose, Bld: 86 mg/dL (ref 70–99)
Potassium: 4.6 mmol/L (ref 3.5–5.1)
Sodium: 137 mmol/L (ref 135–145)
Total Bilirubin: 0.8 mg/dL (ref 0.3–1.2)
Total Protein: 5.6 g/dL — ABNORMAL LOW (ref 6.5–8.1)

## 2020-01-22 LAB — CBC
HCT: 31.2 % — ABNORMAL LOW (ref 39.0–52.0)
Hemoglobin: 9.2 g/dL — ABNORMAL LOW (ref 13.0–17.0)
MCH: 25.7 pg — ABNORMAL LOW (ref 26.0–34.0)
MCHC: 29.5 g/dL — ABNORMAL LOW (ref 30.0–36.0)
MCV: 87.2 fL (ref 80.0–100.0)
Platelets: 188 10*3/uL (ref 150–400)
RBC: 3.58 MIL/uL — ABNORMAL LOW (ref 4.22–5.81)
RDW: 16.4 % — ABNORMAL HIGH (ref 11.5–15.5)
WBC: 13 10*3/uL — ABNORMAL HIGH (ref 4.0–10.5)
nRBC: 0 % (ref 0.0–0.2)

## 2020-01-22 MED ORDER — BUDESON-GLYCOPYRROL-FORMOTEROL 160-9-4.8 MCG/ACT IN AERO
2.0000 | INHALATION_SPRAY | Freq: Two times a day (BID) | RESPIRATORY_TRACT | Status: DC
  Administered 2020-01-22 – 2020-01-30 (×12): 2 via RESPIRATORY_TRACT

## 2020-01-22 MED ORDER — CHLORHEXIDINE GLUCONATE CLOTH 2 % EX PADS
6.0000 | MEDICATED_PAD | Freq: Every day | CUTANEOUS | Status: DC
  Administered 2020-01-24 – 2020-01-25 (×2): 6 via TOPICAL

## 2020-01-22 NOTE — Progress Notes (Addendum)
   01/22/20 1945  Assess: MEWS Score  Pulse Rate (!) 112  ECG Heart Rate (!) 112  Resp 14  SpO2 98 %  Assess: MEWS Score  MEWS Temp 0  MEWS Systolic 0  MEWS Pulse 2  MEWS RR 0  MEWS LOC 0  MEWS Score 2  MEWS Score Color Yellow  Assess: if the MEWS score is Yellow or Red  Were vital signs taken at a resting state? Yes  Focused Assessment Documented focused assessment  Early Detection of Sepsis Score *See Row Information* Low  MEWS guidelines implemented *See Row Information* No, previously yellow, continue vital signs every 4 hours   Patient is still ST.  Patient becomes extremely tachycardic when ambulating from the bed to the [facility].  Per day RN, MD aware and OK with patient continuing to use the [facility].  Patient is asymptomatic. MEWS score continues to be elevated due to patient's HR.  Baseline HR does not meet criteria for parameters for PRN meds.  No acute distress present.

## 2020-01-22 NOTE — Progress Notes (Signed)
   01/22/20 0508  Assess: MEWS Score  Temp 97.8 F (36.6 C)  BP 132/66  Pulse Rate (!) 116  ECG Heart Rate (!) 114  Resp 14  Level of Consciousness Alert  SpO2 100 %  O2 Device Nasal Cannula  O2 Flow Rate (L/min) 4 L/min  Assess: MEWS Score  MEWS Temp 0  MEWS Systolic 0  MEWS Pulse 2  MEWS RR 0  MEWS LOC 0  MEWS Score 2  MEWS Score Color Yellow  Assess: if the MEWS score is Yellow or Red  Were vital signs taken at a resting state? Yes  Focused Assessment Documented focused assessment  Early Detection of Sepsis Score *See Row Information* Low  MEWS guidelines implemented *See Row Information* Yes  Treat  MEWS Interventions Escalated (See documentation below)  Take Vital Signs  Increase Vital Sign Frequency  Yellow: Q 2hr X 2 then Q 4hr X 2, if remains yellow, continue Q 4hrs  Escalate  MEWS: Escalate Yellow: discuss with charge nurse/RN and consider discussing with provider and RRT  Notify: Charge Nurse/RN  Name of Charge Nurse/RN Notified Cordella Register, RN  Date Charge Nurse/RN Notified 01/22/20  Time Charge Nurse/RN Notified 0510

## 2020-01-22 NOTE — Progress Notes (Signed)
[facility] [place]  Subjective  - feels ok   Objective 121/84 (!) 133 (!) 97.5 F (36.4 C) (Oral) 18 97%  Intake/Output Summary (Last 24 hours) at 01/22/2020 1127 Last data filed at 01/22/2020 1000 Gross per 24 hour  Intake 240 ml  Output 200 ml  Net 40 ml   Right groin no hematoma Feet unchanged  Assessment/Planning: Left external iliac common femoral occlusion Femoral endart +/- stent possible fem fem Monday Ok to ambulate  Ruta Hinds 01/22/2020 11:27 AM --  Laboratory Lab Results: Recent Labs    01/21/20 1545 01/22/20 0922  WBC 13.3* 13.0*  HGB 9.0* 9.2*  HCT 30.6* 31.2*  PLT 223 188   BMET Recent Labs    01/21/20 0954 01/21/20 0954 01/21/20 1545 01/22/20 0922  NA 140  --   --  137  K 4.7  --   --  4.6  CL 100  --   --  101  CO2  --   --   --  26  GLUCOSE 101*  --   --  86  BUN 19  --   --  12  CREATININE 1.00   < > 1.12 1.04  CALCIUM  --   --   --  8.7*   < > = values in this interval not displayed.    COAG Lab Results  Component Value Date   INR 1.0 04/19/2019   INR 1.7 (H) 04/15/2019   No results found for: PTT

## 2020-01-23 MED ORDER — CEFAZOLIN SODIUM-DEXTROSE 2-4 GM/100ML-% IV SOLN
2.0000 g | INTRAVENOUS | Status: AC
  Administered 2020-01-24 (×2): 2 g via INTRAVENOUS
  Filled 2020-01-23: qty 100

## 2020-01-23 MED ORDER — DIPHENHYDRAMINE HCL 25 MG PO CAPS
25.0000 mg | ORAL_CAPSULE | Freq: Four times a day (QID) | ORAL | Status: DC | PRN
  Administered 2020-01-23: 25 mg via ORAL
  Filled 2020-01-23: qty 1

## 2020-01-23 NOTE — Plan of Care (Signed)
  Problem: Education: Goal: Knowledge of General Education information will improve Description: Including pain rating scale, medication(s)/side effects and non-pharmacologic comfort measures Outcome: Progressing   Problem: Health Behavior/Discharge Planning: Goal: Ability to manage health-related needs will improve Outcome: Progressing   Problem: Clinical Measurements: Goal: Ability to maintain clinical measurements within normal limits will improve Outcome: Progressing Goal: Will remain free from infection Outcome: Progressing Goal: Diagnostic test results will improve Outcome: Progressing Goal: Respiratory complications will improve Outcome: Progressing Goal: Cardiovascular complication will be avoided Outcome: Progressing   Problem: Activity: Goal: Risk for activity intolerance will decrease Outcome: Progressing   Problem: Nutrition: Goal: Adequate nutrition will be maintained Outcome: Progressing   Problem: Coping: Goal: Level of anxiety will decrease Outcome: Progressing   Problem: Elimination: Goal: Will not experience complications related to urinary retention Outcome: Progressing   Problem: Pain Managment: Goal: General experience of comfort will improve Outcome: Progressing   Problem: Safety: Goal: Ability to remain free from injury will improve Outcome: Progressing

## 2020-01-23 NOTE — Progress Notes (Signed)
[facility] [place]  Subjective  - itching   Objective (!) 108/58 (!) 127 97.9 F (36.6 C) (Oral) 17 97%  Intake/Output Summary (Last 24 hours) at 01/23/2020 1117 Last data filed at 01/22/2020 1935 Gross per 24 hour  Intake 342 ml  Output 200 ml  Net 142 ml   No hematoma in groin Feet unchanged  Assessment/Planning: Pt complains of skin itching.  No obvious etiology.  No rash.  Doubt heparin as he has had this multiple times in past  He is going to wash off today to reduce surface components.  Benadryl for symptoms PRN  To OR tomorrow for left leg ischemia. NPO p midnight consent  Ruta Hinds 01/23/2020 11:17 AM --  Laboratory Lab Results: Recent Labs    01/21/20 1545 01/22/20 0922  WBC 13.3* 13.0*  HGB 9.0* 9.2*  HCT 30.6* 31.2*  PLT 223 188   BMET Recent Labs    01/21/20 0954 01/21/20 0954 01/21/20 1545 01/22/20 0922  NA 140  --   --  137  K 4.7  --   --  4.6  CL 100  --   --  101  CO2  --   --   --  26  GLUCOSE 101*  --   --  86  BUN 19  --   --  12  CREATININE 1.00   < > 1.12 1.04  CALCIUM  --   --   --  8.7*   < > = values in this interval not displayed.    COAG Lab Results  Component Value Date   INR 1.0 04/19/2019   INR 1.7 (H) 04/15/2019   No results found for: PTT

## 2020-01-23 NOTE — H&P (View-Only) (Signed)
[facility] [place]  Subjective  - itching   Objective (!) 108/58 (!) 127 97.9 F (36.6 C) (Oral) 17 97%  Intake/Output Summary (Last 24 hours) at 01/23/2020 1117 Last data filed at 01/22/2020 1935 Gross per 24 hour  Intake 342 ml  Output 200 ml  Net 142 ml   No hematoma in groin Feet unchanged  Assessment/Planning: Pt complains of skin itching.  No obvious etiology.  No rash.  Doubt heparin as he has had this multiple times in past  He is going to wash off today to reduce surface components.  Benadryl for symptoms PRN  To OR tomorrow for left leg ischemia. NPO p midnight consent  Ruta Hinds 01/23/2020 11:17 AM --  Laboratory Lab Results: Recent Labs    01/21/20 1545 01/22/20 0922  WBC 13.3* 13.0*  HGB 9.0* 9.2*  HCT 30.6* 31.2*  PLT 223 188   BMET Recent Labs    01/21/20 0954 01/21/20 0954 01/21/20 1545 01/22/20 0922  NA 140  --   --  137  K 4.7  --   --  4.6  CL 100  --   --  101  CO2  --   --   --  26  GLUCOSE 101*  --   --  86  BUN 19  --   --  12  CREATININE 1.00   < > 1.12 1.04  CALCIUM  --   --   --  8.7*   < > = values in this interval not displayed.    COAG Lab Results  Component Value Date   INR 1.0 04/19/2019   INR 1.7 (H) 04/15/2019   No results found for: PTT

## 2020-01-24 ENCOUNTER — Inpatient Hospital Stay (HOSPITAL_COMMUNITY): Payer: Medicare Other

## 2020-01-24 ENCOUNTER — Inpatient Hospital Stay (HOSPITAL_COMMUNITY): Payer: Medicare Other | Admitting: Anesthesiology

## 2020-01-24 ENCOUNTER — Encounter (HOSPITAL_COMMUNITY)
Admission: RE | Disposition: A | Discharge status: Home Health | Payer: Self-pay | Source: Home / Self Care | Attending: Vascular Surgery

## 2020-01-24 ENCOUNTER — Encounter (HOSPITAL_COMMUNITY): Payer: Self-pay | Admitting: Vascular Surgery

## 2020-01-24 DIAGNOSIS — I998 Other disorder of circulatory system: Secondary | ICD-10-CM

## 2020-01-24 HISTORY — PX: INSERTION OF ILIAC STENT: SHX6256

## 2020-01-24 HISTORY — PX: ENDARTERECTOMY FEMORAL: SHX5804

## 2020-01-24 HISTORY — PX: FEMORAL ENDARTERECTOMY: SUR606

## 2020-01-24 HISTORY — PX: ULTRASOUND GUIDANCE FOR VASCULAR ACCESS: SHX6516

## 2020-01-24 LAB — PROTIME-INR
INR: 1 (ref 0.8–1.2)
Prothrombin Time: 12.3 seconds (ref 11.4–15.2)

## 2020-01-24 LAB — CBC
HCT: 26.2 % — ABNORMAL LOW (ref 39.0–52.0)
HCT: 25.2 % — ABNORMAL LOW (ref 39.0–52.0)
Hemoglobin: 7.9 g/dL — ABNORMAL LOW (ref 13.0–17.0)
Hemoglobin: 7.7 g/dL — ABNORMAL LOW (ref 13.0–17.0)
MCH: 25.1 pg — ABNORMAL LOW (ref 26.0–34.0)
MCH: 25.4 pg — ABNORMAL LOW (ref 26.0–34.0)
MCHC: 30.2 g/dL (ref 30.0–36.0)
MCHC: 30.6 g/dL (ref 30.0–36.0)
MCV: 83.2 fL (ref 80.0–100.0)
MCV: 83.2 fL (ref 80.0–100.0)
Platelets: 160 10*3/uL (ref 150–400)
Platelets: 155 10*3/uL (ref 150–400)
RBC: 3.15 MIL/uL — ABNORMAL LOW (ref 4.22–5.81)
RBC: 3.03 MIL/uL — ABNORMAL LOW (ref 4.22–5.81)
RDW: 16.3 % — ABNORMAL HIGH (ref 11.5–15.5)
RDW: 16.1 % — ABNORMAL HIGH (ref 11.5–15.5)
WBC: 9.1 10*3/uL (ref 4.0–10.5)
WBC: 10.6 10*3/uL — ABNORMAL HIGH (ref 4.0–10.5)
nRBC: 0 % (ref 0.0–0.2)
nRBC: 0 % (ref 0.0–0.2)

## 2020-01-24 LAB — BASIC METABOLIC PANEL
Anion gap: 11 (ref 5–15)
BUN: 11 mg/dL (ref 8–23)
CO2: 27 mmol/L (ref 22–32)
Calcium: 8.6 mg/dL — ABNORMAL LOW (ref 8.9–10.3)
Chloride: 98 mmol/L (ref 98–111)
Creatinine, Ser: 1.05 mg/dL (ref 0.61–1.24)
GFR calc Af Amer: >60 mL/min (ref >60)
GFR calc non Af Amer: >60 mL/min (ref >60)
Glucose, Bld: 93 mg/dL (ref 70–99)
Potassium: 3.8 mmol/L (ref 3.5–5.1)
Sodium: 136 mmol/L (ref 135–145)

## 2020-01-24 LAB — POCT ACTIVATED CLOTTING TIME
Activated Clotting Time: 395 seconds
Activated Clotting Time: 175 seconds
Activated Clotting Time: 390 seconds

## 2020-01-24 LAB — SURGICAL PCR SCREEN
MRSA, PCR: NEGATIVE
Staphylococcus aureus: NEGATIVE

## 2020-01-24 LAB — POCT I-STAT 7, (LYTES, BLD GAS, ICA,H+H)
Acid-Base Excess: 10 mmol/L — ABNORMAL HIGH (ref 0.0–2.0)
Bicarbonate: 35.8 mmol/L — ABNORMAL HIGH (ref 20.0–28.0)
Calcium, Ion: 1.17 mmol/L (ref 1.15–1.40)
HCT: 24 % — ABNORMAL LOW (ref 39.0–52.0)
Hemoglobin: 8.2 g/dL — ABNORMAL LOW (ref 13.0–17.0)
O2 Saturation: 99 %
Patient temperature: 36
Potassium: 3.2 mmol/L — ABNORMAL LOW (ref 3.5–5.1)
Sodium: 137 mmol/L (ref 135–145)
TCO2: 37 mmol/L — ABNORMAL HIGH (ref 22–32)
pCO2 arterial: 50.6 mmHg — ABNORMAL HIGH (ref 32.0–48.0)
pH, Arterial: 7.454 — ABNORMAL HIGH (ref 7.350–7.450)
pO2, Arterial: 141 mmHg — ABNORMAL HIGH (ref 83.0–108.0)

## 2020-01-24 LAB — SARS CORONAVIRUS 2 (TAT 6-24 HRS): SARS Coronavirus 2: NEGATIVE

## 2020-01-24 LAB — PREPARE RBC (CROSSMATCH)

## 2020-01-24 LAB — SARS CORONAVIRUS 2 BY RT PCR (HOSPITAL ORDER, PERFORMED IN ~~LOC~~ HOSPITAL LAB): SARS Coronavirus 2: NEGATIVE

## 2020-01-24 SURGERY — ENDARTERECTOMY, FEMORAL
Anesthesia: Spinal | Site: Groin | Laterality: Right

## 2020-01-24 MED ORDER — SODIUM CHLORIDE 0.9 % IV SOLN
INTRAVENOUS | Status: AC
  Filled 2020-01-24: qty 1.2

## 2020-01-24 MED ORDER — FENTANYL CITRATE (PF) 100 MCG/2ML IJ SOLN
25.0000 ug | INTRAMUSCULAR | Status: DC | PRN
  Administered 2020-01-24 (×2): 25 ug via INTRAVENOUS

## 2020-01-24 MED ORDER — PROTAMINE SULFATE 10 MG/ML IV SOLN
INTRAVENOUS | Status: DC | PRN
Start: 2020-01-24 — End: 2020-01-24
  Administered 2020-01-24 (×5): 10 mg via INTRAVENOUS

## 2020-01-24 MED ORDER — PROPOFOL 500 MG/50ML IV EMUL
INTRAVENOUS | Status: DC | PRN
  Administered 2020-01-24: 40 ug/kg/min via INTRAVENOUS

## 2020-01-24 MED ORDER — SODIUM CHLORIDE 0.9 % IV SOLN
INTRAVENOUS | Status: DC | PRN
Start: 2020-01-24 — End: 2020-01-24

## 2020-01-24 MED ORDER — PROTAMINE SULFATE 10 MG/ML IV SOLN
INTRAVENOUS | Status: AC
  Filled 2020-01-24: qty 5

## 2020-01-24 MED ORDER — SODIUM CHLORIDE 0.9 % IV SOLN
INTRAVENOUS | Status: DC | PRN
  Administered 2020-01-24 (×2): 500 mL

## 2020-01-24 MED ORDER — FENTANYL CITRATE (PF) 250 MCG/5ML IJ SOLN
INTRAMUSCULAR | Status: AC
  Filled 2020-01-24: qty 5

## 2020-01-24 MED ORDER — PROPOFOL 10 MG/ML IV BOLUS
INTRAVENOUS | Status: DC | PRN
  Administered 2020-01-24: 10 mg via INTRAVENOUS

## 2020-01-24 MED ORDER — ALBUMIN HUMAN 5 % IV SOLN
INTRAVENOUS | Status: AC
  Filled 2020-01-24: qty 250

## 2020-01-24 MED ORDER — OXYCODONE HCL 5 MG/5ML PO SOLN: 5.0000 mg | Freq: Once | ORAL | Status: DC | PRN

## 2020-01-24 MED ORDER — 0.9 % SODIUM CHLORIDE (POUR BTL) OPTIME
TOPICAL | Status: DC | PRN
  Administered 2020-01-24: 1000 mL

## 2020-01-24 MED ORDER — PROPOFOL 10 MG/ML IV BOLUS
INTRAVENOUS | Status: AC
  Filled 2020-01-24: qty 20

## 2020-01-24 MED ORDER — SODIUM CHLORIDE 0.9% IV SOLUTION: Freq: Once | INTRAVENOUS | Status: AC

## 2020-01-24 MED ORDER — LACTATED RINGERS IV SOLN: INTRAVENOUS | Status: DC

## 2020-01-24 MED ORDER — ONDANSETRON HCL 4 MG/2ML IJ SOLN: 4.0000 mg | Freq: Four times a day (QID) | INTRAMUSCULAR | Status: DC | PRN

## 2020-01-24 MED ORDER — CEFAZOLIN SODIUM 1 G IJ SOLR
INTRAMUSCULAR | Status: AC
  Filled 2020-01-24: qty 20

## 2020-01-24 MED ORDER — BUPIVACAINE-EPINEPHRINE (PF) 0.5% -1:200000 IJ SOLN
INTRAMUSCULAR | Status: DC | PRN
  Administered 2020-01-24: 3 mL

## 2020-01-24 MED ORDER — ALBUMIN HUMAN 5 % IV SOLN
12.5000 g | Freq: Once | INTRAVENOUS | Status: AC
  Administered 2020-01-24: 12.5 g via INTRAVENOUS

## 2020-01-24 MED ORDER — CHLORHEXIDINE GLUCONATE 0.12 % MT SOLN: 15.0000 mL | Freq: Once | OROMUCOSAL | Status: AC

## 2020-01-24 MED ORDER — IODIXANOL 320 MG/ML IV SOLN
INTRAVENOUS | Status: DC | PRN
  Administered 2020-01-24: 100 mL
  Administered 2020-01-24: 250 mL

## 2020-01-24 MED ORDER — HEMOSTATIC AGENTS (NO CHARGE) OPTIME
TOPICAL | Status: DC | PRN
  Administered 2020-01-24: 1 via TOPICAL

## 2020-01-24 MED ORDER — PROPOFOL 1000 MG/100ML IV EMUL
INTRAVENOUS | Status: AC
  Filled 2020-01-24: qty 100

## 2020-01-24 MED ORDER — SODIUM CHLORIDE 0.9% IV SOLUTION: Freq: Once | INTRAVENOUS | Status: DC

## 2020-01-24 MED ORDER — OXYCODONE HCL 5 MG PO TABS: 5.0000 mg | ORAL_TABLET | Freq: Once | ORAL | Status: DC | PRN

## 2020-01-24 MED ORDER — PHENYLEPHRINE HCL-NACL 10-0.9 MG/250ML-% IV SOLN
INTRAVENOUS | Status: DC | PRN
  Administered 2020-01-24: 20 ug/min via INTRAVENOUS

## 2020-01-24 MED ORDER — FENTANYL CITRATE (PF) 100 MCG/2ML IJ SOLN
INTRAMUSCULAR | Status: DC | PRN
  Administered 2020-01-24 (×2): 50 ug via INTRAVENOUS

## 2020-01-24 MED ORDER — CHLORHEXIDINE GLUCONATE 0.12 % MT SOLN
OROMUCOSAL | Status: AC
  Administered 2020-01-24: 15 mL via OROMUCOSAL
  Filled 2020-01-24: qty 15

## 2020-01-24 MED ORDER — FENTANYL CITRATE (PF) 100 MCG/2ML IJ SOLN
INTRAMUSCULAR | Status: AC
  Filled 2020-01-24: qty 2

## 2020-01-24 MED ORDER — HEPARIN SODIUM (PORCINE) 1000 UNIT/ML IJ SOLN
INTRAMUSCULAR | Status: DC | PRN
Start: 2020-01-24 — End: 2020-01-24
  Administered 2020-01-24 (×3): 8000 [IU] via INTRAVENOUS

## 2020-01-24 SURGICAL SUPPLY — 94 items
ADH SKN CLS APL DERMABOND .7 (GAUZE/BANDAGES/DRESSINGS) ×4
AGENT HMST SPONGE THK3/8 (HEMOSTASIS)
BAG DECANTER FOR FLEXI CONT (MISCELLANEOUS) ×2 IMPLANT
BAG ISL DRAPE 18X18 STRL (DRAPES)
BAG ISOLATION DRAPE 18X18 (DRAPES) ×3 IMPLANT
BALLN MUSTANG 6X60X75 (BALLOONS) ×5
BALLOON MUSTANG 6X60X75 (BALLOONS) ×1 IMPLANT
CANISTER SUCT 3000ML PPV (MISCELLANEOUS) ×5 IMPLANT
CANNULA VESSEL 3MM 2 BLNT TIP (CANNULA) ×10 IMPLANT
CATH BEACON 5 .035 65 KMP TIP (CATHETERS) ×2 IMPLANT
CATH CROSS OVER TEMPO 5F (CATHETERS) ×2 IMPLANT
CATH FOLEY 2WAY SLVR  5CC 12FR (CATHETERS) ×5
CATH FOLEY 2WAY SLVR 5CC 12FR (CATHETERS) ×1 IMPLANT
CATH OMNI FLUSH 5F 65CM (CATHETERS) ×5 IMPLANT
CATH STRAIGHT 5FR 65CM (CATHETERS) ×2 IMPLANT
CLIP VESOCCLUDE MED 24/CT (CLIP) ×5 IMPLANT
CLIP VESOCCLUDE SM WIDE 24/CT (CLIP) ×5 IMPLANT
COVER DOME SNAP 22 D (MISCELLANEOUS) ×2 IMPLANT
COVER WAND RF STERILE (DRAPES) ×3 IMPLANT
DERMABOND ADVANCED (GAUZE/BANDAGES/DRESSINGS) ×1
DERMABOND ADVANCED .7 DNX12 (GAUZE/BANDAGES/DRESSINGS) ×4 IMPLANT
DEVICE TORQUE KENDALL .025-038 (MISCELLANEOUS) ×2 IMPLANT
DRAIN HEMOVAC 1/8 X 5 (WOUND CARE) IMPLANT
DRAPE ISOLATION BAG 18X18 (DRAPES)
DRSG TEGADERM 2-3/8X2-3/4 SM (GAUZE/BANDAGES/DRESSINGS) ×2 IMPLANT
ELECT REM PT RETURN 9FT ADLT (ELECTROSURGICAL) ×5
ELECTRODE REM PT RTRN 9FT ADLT (ELECTROSURGICAL) ×4 IMPLANT
EVACUATOR SILICONE 100CC (DRAIN) IMPLANT
GAUZE 4X4 16PLY RFD (DISPOSABLE) ×2 IMPLANT
GAUZE SPONGE 2X2 8PLY STRL LF (GAUZE/BANDAGES/DRESSINGS) ×1 IMPLANT
GLIDEWIRE ADV .035X180CM (WIRE) ×2 IMPLANT
GLOVE BIO SURGEON STRL SZ 6.5 (GLOVE) ×2 IMPLANT
GLOVE BIO SURGEON STRL SZ7.5 (GLOVE) ×7 IMPLANT
GLOVE BIOGEL PI IND STRL 6.5 (GLOVE) ×1 IMPLANT
GLOVE BIOGEL PI IND STRL 7.5 (GLOVE) ×1 IMPLANT
GLOVE BIOGEL PI IND STRL 8 (GLOVE) ×4 IMPLANT
GLOVE BIOGEL PI INDICATOR 6.5 (GLOVE) ×1
GLOVE BIOGEL PI INDICATOR 7.5 (GLOVE) ×1
GLOVE BIOGEL PI INDICATOR 8 (GLOVE) ×1
GLOVE INDICATOR 7.5 STRL GRN (GLOVE) ×2 IMPLANT
GOWN STRL REUS W/ TWL LRG LVL3 (GOWN DISPOSABLE) ×13 IMPLANT
GOWN STRL REUS W/TWL LRG LVL3 (GOWN DISPOSABLE) ×20
HEMOSTAT ARISTA ABSORB 3G PWDR (HEMOSTASIS) ×2 IMPLANT
HEMOSTAT SPONGE AVITENE ULTRA (HEMOSTASIS) IMPLANT
KIT BASIN OR (CUSTOM PROCEDURE TRAY) ×5 IMPLANT
KIT ENCORE 26 ADVANTAGE (KITS) ×2 IMPLANT
KIT TURNOVER KIT B (KITS) ×5 IMPLANT
NDL PERC 18GX7CM (NEEDLE) IMPLANT
NEEDLE PERC 18GX7CM (NEEDLE) ×5 IMPLANT
NS IRRIG 1000ML POUR BTL (IV SOLUTION) ×10 IMPLANT
PACK ENDO MINOR (CUSTOM PROCEDURE TRAY) ×5 IMPLANT
PACK PERIPHERAL VASCULAR (CUSTOM PROCEDURE TRAY) ×5 IMPLANT
PAD ARMBOARD 7.5X6 YLW CONV (MISCELLANEOUS) ×10 IMPLANT
PATCH HEMASHIELD 8X75 (Vascular Products) ×2 IMPLANT
PROTECTION STATION PRESSURIZED (MISCELLANEOUS) ×5
SET MICROPUNCTURE 5F STIFF (MISCELLANEOUS) IMPLANT
SHEATH PINNACLE 5F 10CM (SHEATH) ×9 IMPLANT
SHEATH PINNACLE ST 7F 45CM (SHEATH) ×2 IMPLANT
SNARE GOOSENECK 10MM (VASCULAR PRODUCTS) ×2 IMPLANT
SPONGE GAUZE 2X2 STER 10/PKG (GAUZE/BANDAGES/DRESSINGS) ×1
STAPLER VISISTAT 35W (STAPLE) IMPLANT
STATION PROTECTION PRESSURIZED (MISCELLANEOUS) ×1 IMPLANT
STENT INNOVA 7X40X130 (Permanent Stent) IMPLANT
STENT INNOVA 7X60X130 (Permanent Stent) ×2 IMPLANT
STENT VIABAHN VBX 8X59X135 (Permanent Stent) ×2 IMPLANT
STENT VIABAHNBX 8X39X135 (Permanent Stent) ×2 IMPLANT
STOPCOCK MORSE 400PSI 3WAY (MISCELLANEOUS) ×4 IMPLANT
SUT ETHILON 3 0 PS 1 (SUTURE) IMPLANT
SUT PROLENE 5 0 C 1 24 (SUTURE) ×12 IMPLANT
SUT PROLENE 6 0 CC (SUTURE) ×5 IMPLANT
SUT SILK 3 0 (SUTURE)
SUT SILK 3-0 18XBRD TIE 12 (SUTURE) IMPLANT
SUT VIC AB 2-0 SH 27 (SUTURE) ×15
SUT VIC AB 2-0 SH 27XBRD (SUTURE) ×9 IMPLANT
SUT VIC AB 3-0 SH 27 (SUTURE) ×20
SUT VIC AB 3-0 SH 27X BRD (SUTURE) ×10 IMPLANT
SUT VICRYL 4-0 PS2 18IN ABS (SUTURE) ×10 IMPLANT
SYR 20ML LL LF (SYRINGE) ×4 IMPLANT
SYR MEDRAD MARK V 150ML (SYRINGE) IMPLANT
TAPE UMBILICAL COTTON 1/8X30 (MISCELLANEOUS) IMPLANT
TOWEL GREEN STERILE (TOWEL DISPOSABLE) ×5 IMPLANT
TOWEL GREEN STERILE FF (TOWEL DISPOSABLE) ×2 IMPLANT
TRAY FOLEY MTR SLVR 16FR STAT (SET/KITS/TRAYS/PACK) ×3 IMPLANT
TRAY FOLEY SLVR 14FR TEMP STAT (SET/KITS/TRAYS/PACK) ×2 IMPLANT
TUBING CIL FLEX 10 FLL-RA (TUBING) ×2 IMPLANT
TUBING HIGH PRESSURE 120CM (CONNECTOR) ×2 IMPLANT
TUBING INJECTOR 48 (MISCELLANEOUS) ×2 IMPLANT
UNDERPAD 30X36 HEAVY ABSORB (UNDERPADS AND DIAPERS) ×5 IMPLANT
WATER STERILE IRR 1000ML POUR (IV SOLUTION) ×5 IMPLANT
WIRE AMPLATZ SS-J .035X260CM (WIRE) ×2 IMPLANT
WIRE BENTSON .035X145CM (WIRE) ×5 IMPLANT
WIRE EMERALD 3MM-J .035X150CM (WIRE) ×2 IMPLANT
WIRE G V18X300CM (WIRE) ×2 IMPLANT
WIRE STARTER BENTSON 035X150 (WIRE) ×2 IMPLANT

## 2020-01-24 NOTE — Transfer of Care (Signed)
Immediate Anesthesia Transfer of Care Note  Patient: Francisco [name]  Procedure(s) Performed: LEFT FEMORAL ENDARTERECTOMY WITH DACRON PATCH ANGIOPLASTY (Left Groin) INSERTION OF VBX STENT [id number] AND 8X39 LEFT COMMON ILIAC ARTERY. INSERTION OF INNOVA 7 X 60 INNOVA STENT LEFT EXTERNAL ILIAC ARTERY.  (Left ) ULTRASOUND GUIDANCE FOR VASCULAR ACCESS (Right Groin)  Patient Location: PACU  Anesthesia Type:MAC combined with regional for post-op pain  Level of Consciousness: awake and patient cooperative  Airway & Oxygen Therapy: Patient Spontanous Breathing and Patient connected to face mask oxygen  Post-op Assessment: Report given to RN and Post -op Vital signs reviewed and stable  Post vital signs: Reviewed and stable  Last Vitals:  Vitals Value Taken Time  BP 110/89 01/24/20 1947  Temp    Pulse 89 01/24/20 1954  Resp 17 01/24/20 1954  SpO2 94 % 01/24/20 1954  Vitals shown include unvalidated device data.  Last Pain:  Vitals:   01/24/20 1931  TempSrc:   PainSc: (P) 0-No pain      Patients Stated Pain Goal: 0 (47/34/03 7096)  Complications: No complications documented.

## 2020-01-24 NOTE — Anesthesia Preprocedure Evaluation (Addendum)
Anesthesia Evaluation  Patient identified by MRN, date of birth, ID band Patient awake    Reviewed: Allergy & Precautions, H&P , NPO status , Patient's Chart, lab work & pertinent test results  Airway Mallampati: II  TM Distance: >3 FB Neck ROM: Full    Dental no notable dental hx. (+) Teeth Intact, Dental Advisory Given   Pulmonary shortness of breath, COPD,  COPD inhaler and oxygen dependent, former smoker,    Pulmonary exam normal  + decreased breath sounds      Cardiovascular hypertension, Pt. on medications + Peripheral Vascular Disease   Rhythm:Regular Rate:Normal     Neuro/Psych negative neurological ROS  negative psych ROS   GI/Hepatic negative GI ROS, Neg liver ROS,   Endo/Other  negative endocrine ROS  Renal/GU negative Renal ROS  negative genitourinary   Musculoskeletal   Abdominal   Peds  Hematology negative hematology ROS (+)   Anesthesia Other Findings   Reproductive/Obstetrics negative OB ROS                            Anesthesia Physical Anesthesia Plan  ASA: III  Anesthesia Plan: Spinal   Post-op Pain Management:    Induction: Intravenous  PONV Risk Score and Plan: 2 and Propofol infusion, Ondansetron and Midazolam  Airway Management Planned: Simple Face Mask  Additional Equipment: Arterial line  Intra-op Plan:   Post-operative Plan:   Informed Consent: I have reviewed the patients History and Physical, chart, labs and discussed the procedure including the risks, benefits and alternatives for the proposed anesthesia with the patient or authorized representative who has indicated his/her understanding and acceptance.     Dental advisory given  Plan Discussed with: CRNA  Anesthesia Plan Comments:        Anesthesia Quick Evaluation

## 2020-01-24 NOTE — Progress Notes (Signed)
COVID 19  Pre Surgery screening specimen sent at 0920. Awaiting for results. OR aware

## 2020-01-24 NOTE — Interval H&P Note (Signed)
History and Physical Interval Note:  01/24/2020 1:44 PM  Francisco [name]  has presented today for surgery, with the diagnosis of LEFT FOOT REST PAIN.  The various methods of treatment have been discussed with the patient and family. After consideration of risks, benefits and other options for treatment, the patient has consented to  Procedure(s) with comments: LEFT FEMORAL ENDARTERECTOMY (Left) - SPINAL OR EPIDURAL ANESTHESIA IF POSSIBLE INSERTION OF LEFT ILIAC STENT (Left) BYPASS GRAFT FEMORAL-FEMORAL ARTERY (N/A) as a surgical intervention.  The patient's history has been reviewed, patient examined, no change in status, stable for surgery.  I have reviewed the patient's chart and labs.  Questions were answered to the patient's satisfaction.     Ruta Hinds

## 2020-01-24 NOTE — OR Nursing (Signed)
Sheath removed from Right Groin @01908  by Demetrius Revel ST and pressure held for 10 minutes.  Groin site unremarkable; soft and without hematoma.  PACU RN notified to keep on bedrest for 4 hours and right leg straight.

## 2020-01-24 NOTE — TOC Initial Note (Signed)
Transition of Care [facility]) - Initial/Assessment Note    Patient Details  Name: Francisco [name] MRN: [id number] Date of Birth: 09-04-1945  Transition of [facility]) CM/SW Contact:    Ninfa Meeker, RN Phone Number: 01/24/2020, 10:51 AM  Clinical Narrative:      74 yr old male from home with wife, admitted with PVD. Discoloration left foot and toes. S/p Right femoral Endarterectomy 01/21/20. To have Left Endarterectomy today. TOC team will continue to monitor for needs.                    Patient Goals and CMS Choice        Expected Discharge Plan and Services           Expected Discharge Date: 01/21/20                                    Prior Living Arrangements/Services                       Activities of Daily Living      Permission Sought/Granted                  Emotional Assessment              Admission diagnosis:  PAD (peripheral artery disease) ([place]) [I73.9] Patient Active Problem List   Diagnosis Date Noted  . Acute exacerbation of chronic bronchitis ([place]) 11/30/2019  . PAD (peripheral artery disease) ([place]) 12/11/2018  . Pre-operative respiratory examination 12/10/2018  . COPD with acute exacerbation ([place]) 11/27/2018  . Stage 4 very severe COPD by GOLD classification ([place]) 11/27/2018  . DNR (do not resuscitate) 04/21/2018  . Diastolic dysfunction 86/76/1950  . Chronic pulmonary embolism ([place]) 04/07/2018  . Essential tremor 03/31/2018  . BPPV (benign paroxysmal positional vertigo), right 02/10/2018  . Osteoporosis 11/19/2017  . Syncope 11/02/2017  . Thoracic compression fracture ([place]) 11/02/2017  . Essential hypertension 11/02/2017  . Venous stasis dermatitis of both lower extremities 11/02/2017  . Chronic respiratory failure with hypoxia ([place]) 09/23/2017  . BPH associated with nocturia 06/25/2017  . Leukocytosis 12/19/2016  . Hyperglycemia 06/12/2016  . Hyperlipidemia 07/22/2014  . Onychomycosis 07/22/2014  .  Left ankle swelling 07/22/2014  . History of skin cancer 05/17/2014  . Chronic rhinitis 10/28/2012  . Pulmonary nodule 09/23/2011  . Former smoker 08/23/2008  . COPD (chronic obstructive pulmonary disease) with emphysema ([place]) 08/23/2008  . History of colonic polyps 08/23/2008   PCP:  [name], MD Pharmacy:   [id number] - [place], [address] [place] [address] Phone: [phone] Fax: [phone]  [place], [address] [place], [address] [place], [address] [place] [address] Phone: [phone] Fax: [phone]  [facility] [place] [place] - [address] [place] [address] Phone: [phone] Fax: [phone]     Social Determinants of Health (SDOH) Interventions    Readmission Risk Interventions No flowsheet data found.

## 2020-01-24 NOTE — Care Management Important Message (Signed)
Important Message  Patient Details  Name: TROYCE GIESKE MRN: [id number] Date of Birth: 1946/05/03   Medicare Important Message Given:  Yes     Shelda Altes 01/24/2020, 11:58 AM

## 2020-01-24 NOTE — Anesthesia Procedure Notes (Signed)
Spinal  Patient location during procedure: OR Start time: 01/24/2020 2:40 PM End time: 01/24/2020 2:44 PM Staffing Performed: anesthesiologist  Anesthesiologist: Roderic Palau, MD Preanesthetic Checklist Completed: patient identified, IV checked, risks and benefits discussed, surgical consent, monitors and equipment checked, pre-op evaluation and timeout performed Spinal Block Patient position: sitting Prep: DuraPrep Patient monitoring: cardiac monitor, continuous pulse ox and blood pressure Approach: midline Location: L2-3 Injection technique: single-shot Needle Needle type: Pencan  Needle gauge: 24 G Needle length: 9 cm Assessment Sensory level: T8 Additional Notes Functioning IV was confirmed and monitors were applied. Sterile prep and drape, including hand hygiene and sterile gloves were used. The patient was positioned and the spine was prepped. The skin was anesthetized with lidocaine.  Free flow of clear CSF was obtained prior to injecting local anesthetic into the CSF.  The spinal needle aspirated freely following injection.  The needle was carefully withdrawn.  The patient tolerated the procedure well.

## 2020-01-24 NOTE — Op Note (Addendum)
Procedure: Left common femoral endarterectomy, left common and external iliac stent, right femoral artery retrograde puncture, ultrasound groin  Preoperative diagnosis: Critical limb ischemia left foot  Postoperative diagnosis: Same  Anesthesia: Spinal with sedation  Assistant: Fortunato Curling, MD, Arlee Muslim, PA-C.  Dr. Carlis Abbott was necessary for expediting the procedure with a complicated endovascular revascularization as well as exposure and completion of the anastomosis.  Arlee Muslim was critical to the procedure for exposure and expediting the procedure.  Operative findings: 1.  Dacron patch left common femoral artery  2.  Left common iliac artery 8 x 59, 8 x 39 VBX stent  3.  7 x 60 Innova stent of left external iliac artery  Operative details: After obtaining form consent, the patient was taken the operating.  The patient was placed in supine position operating table.  After a spinal anesthetic was placed by the anesthesia team a Foley catheter was also placed.  Next patient was prepped and draped in usual sterile fashion from the umbilicus down to the knees.  A longitudinal incision was made in the left groin carried down to the subcutaneous tissues down the level of the left common femoral artery.  It was occluded.  It was heavily calcified.  It was dissected free circumferentially.  Dissection was carried up underneath the inguinal ligament about 3 cm under the distal external iliac artery.  It was heavily calcified in this area as well.  Circumflex iliac branches were dissected free circumferentially and Vesseloops placed around these.  Vesseloops placed around the external iliac as well.  Common femoral artery was dissected free circumferentially as well as the profunda and superficial femoral arteries.  The patient was then given 8000 units of intravenous heparin.  He was given additional heparin during the course of the case to maintain an ACT greater than 250.  The artery was controlled  proximally distally.  A longitudinal opening was made in the anterior surface of the left common femoral artery and the arteriotomy extended with Potts scissors proximally and distally up to the inguinal ligament and down to the femoral bifurcation.  Endarterectomy was then begun in a suitable plane and a good proximal and distal endpoint was obtained all loose debris was removed.  Dacron patch was then brought up in the operative field and sewn on his patch angioplasty using a running 5-0 Prolene suture.  Despite completion anastomosis it was for blood backbled and thoroughly flushed reanastomosed was secured clamps released there was still no pulsatile flow as the distal external iliac artery was still occluded.  There was good backbleeding from the profunda and superficial femoral arteries.  At this point introducer needle was placed into the anterior surface of the patch and a J-wire advanced into the mid left external iliac artery and a 5 French sheath placed over this.  I then used a series of wires and catheters to try to retrograde recanalize the left iliac system.  I used an 67 Glidewire advantage and a V 18 wire but was unable to get from a subintimal plane back to the true lumen.  At this point I decided to come up and over the aortic bifurcation to do an antegrade procedure.  Ultrasound was used to identify the right common femoral artery and this was cannulated with a introducer needle and an 035 Bentson wire advanced up into the abdominal aorta.  An abdominal aortogram was obtained to make sure that we did not have any significant dissection in the aorta itself.  Left  and right renal arteries filled well the infrarenal abdominal aorta filled well.  There was still about 1 cm of left common iliac artery which was open.  The distal left common and external iliac arteries were totally occluded.  The right common and external iliac artery was patent although there were multiple areas of calcific plaque in  the external iliac artery.  The right internal iliac artery was occluded.  Initially a left internal iliac artery had been visualized but this had become occluded during the attempt at recanalizing the left iliac system.  At this point I was able to use a 5 [place] Omni Flush catheter to selectively catheterize the left common iliac artery and then advance an 035 Glidewire advantage into the distal left external iliac artery.  A snare was then used to grab this wire and pull it out through the left groin.  Using this as a railroad it was swapped out for an Personnel officer and then a 7 [place] destination sheath advanced all the way into the distal left external iliac artery from the right side.  Then proceeded with roadmapping techniques to place a 7 x 60 Inova stent in the external iliac artery followed by 2 stacked VBX stents in 8 x 59 in the mid segment and then 8 x 39 in the proximal segment.  Completion angiogram showed good wall apposition of the stents no significant narrowing there was a small intimal flap at the interface between the Inova stent and a femoral endarterectomy site but it does not appear flow-limiting.  At this point the guidewire was removed the sheath was removed from the left side and the arteriotomy and the dacryon patch repaired with a 5-0 Prolene suture.  After hemostasis was obtained the groin was closed in multiple layers with running 2-0 and 3-0 Vicryl suture and the skin closed with a 4-0 Vicryl subcuticular stitch.  Dermabond was applied.  The patient was given 50 mg of protamine and at this point the ACT was confirmed to be 175.  The 7 French sheath was then pulled and hemostasis obtained with direct pressure.  Patient tolerated procedure well was taken to recovery room in stable condition.  Ruta Hinds, MD [facility] [place] Office: [phone]

## 2020-01-25 ENCOUNTER — Inpatient Hospital Stay (HOSPITAL_COMMUNITY): Payer: Medicare Other

## 2020-01-25 ENCOUNTER — Encounter (HOSPITAL_COMMUNITY): Payer: Self-pay | Admitting: Vascular Surgery

## 2020-01-25 DIAGNOSIS — I739 Peripheral vascular disease, unspecified: Secondary | ICD-10-CM

## 2020-01-25 LAB — BASIC METABOLIC PANEL
Anion gap: 12 (ref 5–15)
BUN: 10 mg/dL (ref 8–23)
CO2: 27 mmol/L (ref 22–32)
Calcium: 8.3 mg/dL — ABNORMAL LOW (ref 8.9–10.3)
Chloride: 98 mmol/L (ref 98–111)
Creatinine, Ser: 1.13 mg/dL (ref 0.61–1.24)
GFR calc Af Amer: >60 mL/min (ref >60)
GFR calc non Af Amer: >60 mL/min (ref >60)
Glucose, Bld: 77 mg/dL (ref 70–99)
Potassium: 3.3 mmol/L — ABNORMAL LOW (ref 3.5–5.1)
Sodium: 137 mmol/L (ref 135–145)

## 2020-01-25 LAB — BPAM RBC
Blood Product Expiration Date: 202108112359
Blood Product Expiration Date: 202108182359
ISSUE DATE / TIME: 202107191405
ISSUE DATE / TIME: 202107192316
Unit Type and Rh: 6200
Unit Type and Rh: 6200

## 2020-01-25 LAB — TYPE AND SCREEN
ABO/RH(D): A POS
Antibody Screen: NEGATIVE
Unit division: 0
Unit division: 0

## 2020-01-25 LAB — CBC
HCT: 31.5 % — ABNORMAL LOW (ref 39.0–52.0)
Hemoglobin: 10 g/dL — ABNORMAL LOW (ref 13.0–17.0)
MCH: 27.3 pg (ref 26.0–34.0)
MCHC: 31.7 g/dL (ref 30.0–36.0)
MCV: 86.1 fL (ref 80.0–100.0)
Platelets: 160 10*3/uL (ref 150–400)
RBC: 3.66 MIL/uL — ABNORMAL LOW (ref 4.22–5.81)
RDW: 16.9 % — ABNORMAL HIGH (ref 11.5–15.5)
WBC: 16.3 10*3/uL — ABNORMAL HIGH (ref 4.0–10.5)
nRBC: 0 % (ref 0.0–0.2)

## 2020-01-25 MED ORDER — CEFAZOLIN SODIUM-DEXTROSE 2-4 GM/100ML-% IV SOLN
2.0000 g | Freq: Three times a day (TID) | INTRAVENOUS | Status: AC
  Administered 2020-01-25 (×2): 2 g via INTRAVENOUS
  Filled 2020-01-25 (×2): qty 100

## 2020-01-25 MED ORDER — ASPIRIN EC 81 MG PO TBEC
81.0000 mg | DELAYED_RELEASE_TABLET | Freq: Every day | ORAL | Status: DC
  Administered 2020-01-25 – 2020-01-31 (×7): 81 mg via ORAL
  Filled 2020-01-25 (×7): qty 1

## 2020-01-25 MED ORDER — POTASSIUM CHLORIDE CRYS ER 20 MEQ PO TBCR: 20.0000 meq | EXTENDED_RELEASE_TABLET | Freq: Every day | ORAL | Status: DC | PRN

## 2020-01-25 MED ORDER — HEPARIN SODIUM (PORCINE) 5000 UNIT/ML IJ SOLN
5000.0000 [IU] | Freq: Three times a day (TID) | INTRAMUSCULAR | Status: DC
  Administered 2020-01-25 – 2020-01-30 (×15): 5000 [IU] via SUBCUTANEOUS
  Filled 2020-01-25 (×15): qty 1

## 2020-01-25 MED ORDER — CLOPIDOGREL BISULFATE 75 MG PO TABS: 75.0000 mg | ORAL_TABLET | Freq: Every day | ORAL | Status: DC

## 2020-01-25 MED ORDER — MORPHINE SULFATE (PF) 2 MG/ML IV SOLN
2.0000 mg | INTRAVENOUS | Status: DC | PRN
  Administered 2020-01-25 – 2020-01-30 (×3): 2 mg via INTRAVENOUS
  Filled 2020-01-25 (×3): qty 1

## 2020-01-25 MED ORDER — ACETAMINOPHEN 325 MG PO TABS
325.0000 mg | ORAL_TABLET | ORAL | Status: DC | PRN
  Administered 2020-01-27 – 2020-01-30 (×4): 650 mg via ORAL
  Filled 2020-01-25 (×4): qty 2

## 2020-01-25 MED ORDER — ACETAMINOPHEN 650 MG RE SUPP: 325.0000 mg | RECTAL | Status: DC | PRN

## 2020-01-25 MED ORDER — SODIUM CHLORIDE 0.9 % IV SOLN: 500.0000 mL | Freq: Once | INTRAVENOUS | Status: DC | PRN

## 2020-01-25 MED ORDER — MAGNESIUM SULFATE 2 GM/50ML IV SOLN: 2.0000 g | Freq: Every day | INTRAVENOUS | Status: DC | PRN

## 2020-01-25 MED ORDER — SODIUM CHLORIDE 0.9 % IV SOLN: INTRAVENOUS | Status: AC

## 2020-01-25 NOTE — Progress Notes (Signed)
ABI study completed.   See Cv Proc for preliminary results.   Francisco [name]

## 2020-01-25 NOTE — Evaluation (Signed)
Physical Therapy Evaluation Patient Details Name: Francisco [name] MRN: [id number] DOB: 11-24-1945 Today's Date: 01/25/2020   History of Present Illness  74 y.o. male presentin with L foot rest pain. 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. PMHx significant for PVD, lumbago, COPD on 3L home O2, skin CA, and vertigo.   Clinical Impression  Pt presents to PT with limitations in mobility due to recent vascular procedure as well as long standing pulmonary issues which limit his activity tolerance. Today pt with decr SpO2 86% and high HR 145 with activity. Expect progress to be slow and steady. Wife supportive.     Follow Up Recommendations Home health PT;Supervision/Assistance - 24 hour (Pt may decide to decline HHPT)    Equipment Recommendations  None recommended by PT    Recommendations for Other Services       Precautions / Restrictions Precautions Precautions: Fall Restrictions Weight Bearing Restrictions: No      Mobility  Bed Mobility Overal bed mobility: Needs Assistance Bed Mobility: Supine to Sit     Supine to sit: Mod assist;HOB elevated     General bed mobility comments: Assist to bring legs off of bed, elevate trunk into sitting and bring hips to EOB.   Transfers Overall transfer level: Needs assistance Equipment used: Rolling walker (2 wheeled) Transfers: Sit to/from [facility] Sit to Stand: Min assist Stand pivot transfers: Min assist       General transfer comment: Assist to bring hips up and for balance. Bed to [facility] with pivotal steps with walker  Ambulation/Gait Ambulation/Gait assistance: Min assist Gait Distance (Feet): 10 Feet Assistive device: Rolling walker (2 wheeled) Gait Pattern/deviations: Step-through pattern;Decreased step length - right;Decreased step length -  left;Shuffle;Trunk flexed Gait velocity: decr Gait velocity interpretation: <1.31 ft/sec, indicative of household ambulator General Gait Details: Assist for balance and support. Verbal cues to stay closer to walker and stand more erect. Pt amb 5' forward and then instead of turning around he stepped backward and side stepped back to chair.   Stairs            Wheelchair Mobility    Modified Rankin (Stroke Patients Only)       Balance Overall balance assessment: Needs assistance Sitting-balance support: No upper extremity supported;Feet supported Sitting balance-Leahy Scale: Fair     Standing balance support: Bilateral upper extremity supported;During functional activity Standing balance-Leahy Scale: Poor Standing balance comment: walker and min guard for static standing                             Pertinent Vitals/Pain Pain Assessment: 0-10 Pain Score: 5  Pain Location: lt foot Pain Descriptors / Indicators: Throbbing Pain Intervention(s): Limited activity within patient's tolerance;Monitored during session;Repositioned    Home Living Family/patient expects to be discharged to:: Private residence Living Arrangements: Spouse/significant other Available Help at Discharge: Family;Available 24 hours/day Type of Home: House Home Access: Stairs to enter Entrance Stairs-Rails: None Entrance Stairs-Number of Steps: 2 Home Layout: One level Home Equipment: Walker - 2 wheels;Shower seat;Bedside commode;Walker - 4 wheels      Prior Function Level of Independence: Needs assistance   Gait / Transfers Assistance Needed: Modified independent short distances with walker           Hand Dominance   Dominant Hand: Right  Extremity/Trunk Assessment   Upper Extremity Assessment Upper Extremity Assessment: Defer to OT evaluation    Lower Extremity Assessment Lower Extremity Assessment: Generalized weakness    Cervical / Trunk Assessment Cervical /  Trunk Assessment: Kyphotic  Communication   Communication: No difficulties  Cognition Arousal/Alertness: Awake/alert Behavior During Therapy: WFL for tasks assessed/performed Overall Cognitive Status: Within Functional Limits for tasks assessed                                        General Comments General comments (skin integrity, edema, etc.): Pt on 4L of O2 with SpO2 86-89% with activity. HR to 145 with activity.    Exercises     Assessment/Plan    PT Assessment Patient needs continued PT services  PT Problem List Decreased strength;Decreased activity tolerance;Decreased balance;Decreased mobility;Cardiopulmonary status limiting activity;Pain       PT Treatment Interventions DME instruction;Gait training;Stair training;Functional mobility training;Therapeutic activities;Therapeutic exercise;Balance training;Patient/family education    PT Goals (Current goals can be found in the Care Plan section)  Acute Rehab PT Goals Patient Stated Goal: return home PT Goal Formulation: With patient/family Time For Goal Achievement: 02/08/20 Potential to Achieve Goals: Good    Frequency Min 3X/week   Barriers to discharge Inaccessible home environment stairs to enter    Co-evaluation               AM-PAC PT "6 Clicks" Mobility  Outcome Measure Help needed turning from your back to your side while in a flat bed without using bedrails?: A Little Help needed moving from lying on your back to sitting on the side of a flat bed without using bedrails?: A Lot Help needed moving to and from a bed to a chair (including a wheelchair)?: A Little Help needed standing up from a chair using your arms (e.g., wheelchair or bedside chair)?: A Little Help needed to walk in hospital room?: A Little Help needed climbing 3-5 steps with a railing? : A Lot 6 Click Score: 16    End of Session Equipment Utilized During Treatment: Gait belt;Oxygen Activity Tolerance: Patient  limited by fatigue;Treatment limited secondary to medical complications (Comment) Patient left: in chair;with call bell/phone within reach;with family/visitor present Nurse Communication: Mobility status PT Visit Diagnosis: Unsteadiness on feet (R26.81);Other abnormalities of gait and mobility (R26.89);Muscle weakness (generalized) (M62.81);Pain Pain - Right/Left: Left Pain - part of body: Ankle and joints of foot    Time: 9147-8295 PT Time Calculation (min) (ACUTE ONLY): 37 min   Charges:   PT Evaluation $PT Eval Moderate Complexity: 1 Mod PT Treatments $Gait Training: 8-22 mins        [place] Pager [phone] Office [place] 01/25/2020, 11:55 AM

## 2020-01-25 NOTE — Progress Notes (Addendum)
  Progress Note    01/25/2020 7:36 AM 1 Day Post-Op  Subjective:  L foot feels better compared to pre-op   Vitals:   01/25/20 0600 01/25/20 0648  BP: 116/71   Pulse: (!) 126 (!) 109  Resp: 20 20  Temp:    SpO2: 90% 90%   Physical Exam: Lungs:  Non labored Incisions:  L groin incision c/d/i; R groin cath site without hematoma Extremities:  L DP and PT by doppler; L DP by doppler Neurologic: A&O  CBC    Component Value Date/Time   WBC 10.6 (H) 01/24/2020 2239   RBC 3.03 (L) 01/24/2020 2239   HGB 7.7 (L) 01/24/2020 2239   HCT 25.2 (L) 01/24/2020 2239   PLT 155 01/24/2020 2239   MCV 83.2 01/24/2020 2239   MCH 25.4 (L) 01/24/2020 2239   MCHC 30.6 01/24/2020 2239   RDW 16.1 (H) 01/24/2020 2239   LYMPHSABS 1.2 11/29/2019 0926   MONOABS 1.2 (H) 11/29/2019 0926   EOSABS 0.2 11/29/2019 0926   BASOSABS 0.1 11/29/2019 0926    BMET    Component Value Date/Time   NA 137 01/24/2020 1513   K 3.2 (L) 01/24/2020 1513   CL 98 01/24/2020 0525   CO2 27 01/24/2020 0525   GLUCOSE 93 01/24/2020 0525   BUN 11 01/24/2020 0525   CREATININE 1.05 01/24/2020 0525   CALCIUM 8.6 (L) 01/24/2020 0525   GFRNONAA >60 01/24/2020 0525   GFRAA >60 01/24/2020 0525    INR    Component Value Date/Time   INR 1.0 01/24/2020 0525     Intake/Output Summary (Last 24 hours) at 01/25/2020 0736 Last data filed at 01/25/2020 0620 Gross per 24 hour  Intake 1749.5 ml  Output 1725 ml  Net 24.5 ml     Assessment/Plan:  74 y.o. male is s/p L CIA/EIA stent and L CFA endarterectomy 1 Day Post-Op   L foot well perfused with doppler signals Transfused pRBCs last night; CBC pending this morning OOB today Likely discharge home tomorrow   Dagoberto Ligas, PA-C [facility] [phone] 01/25/2020 7:36 AM

## 2020-01-25 NOTE — Progress Notes (Signed)
Received patient to room 4e27 from PACU, patient altert and oriented with complaints of pain, pain medication given. Tele monitor applied and CCMD notified. CHG bath given. Oriented to room and call bell within reach.

## 2020-01-25 NOTE — Progress Notes (Signed)
OT Cancellation Note  Patient Details Name: Francisco [name] MRN: [id number] DOB: [birth year]   Cancelled Treatment:    Reason Eval/Treat Not Completed: Fatigue/lethargy limiting ability to participate. Patient declined EOB/OOB activity reporting fatigue from prior PT session. OT will continue efforts when patient is able to participate.   Gloris Manchester OTR/L Supplemental OT, Department of rehab services [phone]  Latrell Reitan R H.  01/25/2020, 3:23 PM

## 2020-01-26 ENCOUNTER — Inpatient Hospital Stay (HOSPITAL_COMMUNITY): Payer: Medicare Other

## 2020-01-26 ENCOUNTER — Encounter (HOSPITAL_COMMUNITY): Payer: Self-pay | Admitting: Vascular Surgery

## 2020-01-26 DIAGNOSIS — J90 Pleural effusion, not elsewhere classified: Secondary | ICD-10-CM | POA: Diagnosis not present

## 2020-01-26 DIAGNOSIS — I739 Peripheral vascular disease, unspecified: Secondary | ICD-10-CM

## 2020-01-26 DIAGNOSIS — J42 Unspecified chronic bronchitis: Secondary | ICD-10-CM

## 2020-01-26 LAB — BLOOD GAS, ARTERIAL
Acid-Base Excess: 5.2 mmol/L — ABNORMAL HIGH (ref 0.0–2.0)
Bicarbonate: 29.2 mmol/L — ABNORMAL HIGH (ref 20.0–28.0)
Drawn by: 205171
FIO2: 36
O2 Saturation: 96.4 %
Patient temperature: 37
pCO2 arterial: 43 mmHg (ref 32.0–48.0)
pH, Arterial: 7.446 (ref 7.350–7.450)
pO2, Arterial: 79.1 mmHg — ABNORMAL LOW (ref 83.0–108.0)

## 2020-01-26 LAB — LIPID PANEL
Cholesterol: 140 mg/dL (ref 0–200)
HDL: 63 mg/dL (ref >40)
LDL Cholesterol: 62 mg/dL (ref 0–99)
Total CHOL/HDL Ratio: 2.2 RATIO
Triglycerides: 76 mg/dL (ref <150)
VLDL: 15 mg/dL (ref 0–40)

## 2020-01-26 LAB — TROPONIN I (HIGH SENSITIVITY)
Troponin I (High Sensitivity): 12 ng/L (ref <18)
Troponin I (High Sensitivity): 15 ng/L (ref <18)

## 2020-01-26 IMAGING — CR DG CHEST 2V
2 series · 2 of 2 positions shown · non-contrast
Comparison: [DATE].  [DATE].  [DATE].

CLINICAL DATA: Shortness of breath.  Cough.

EXAM:
CHEST - 2 VIEW

[chest lat]
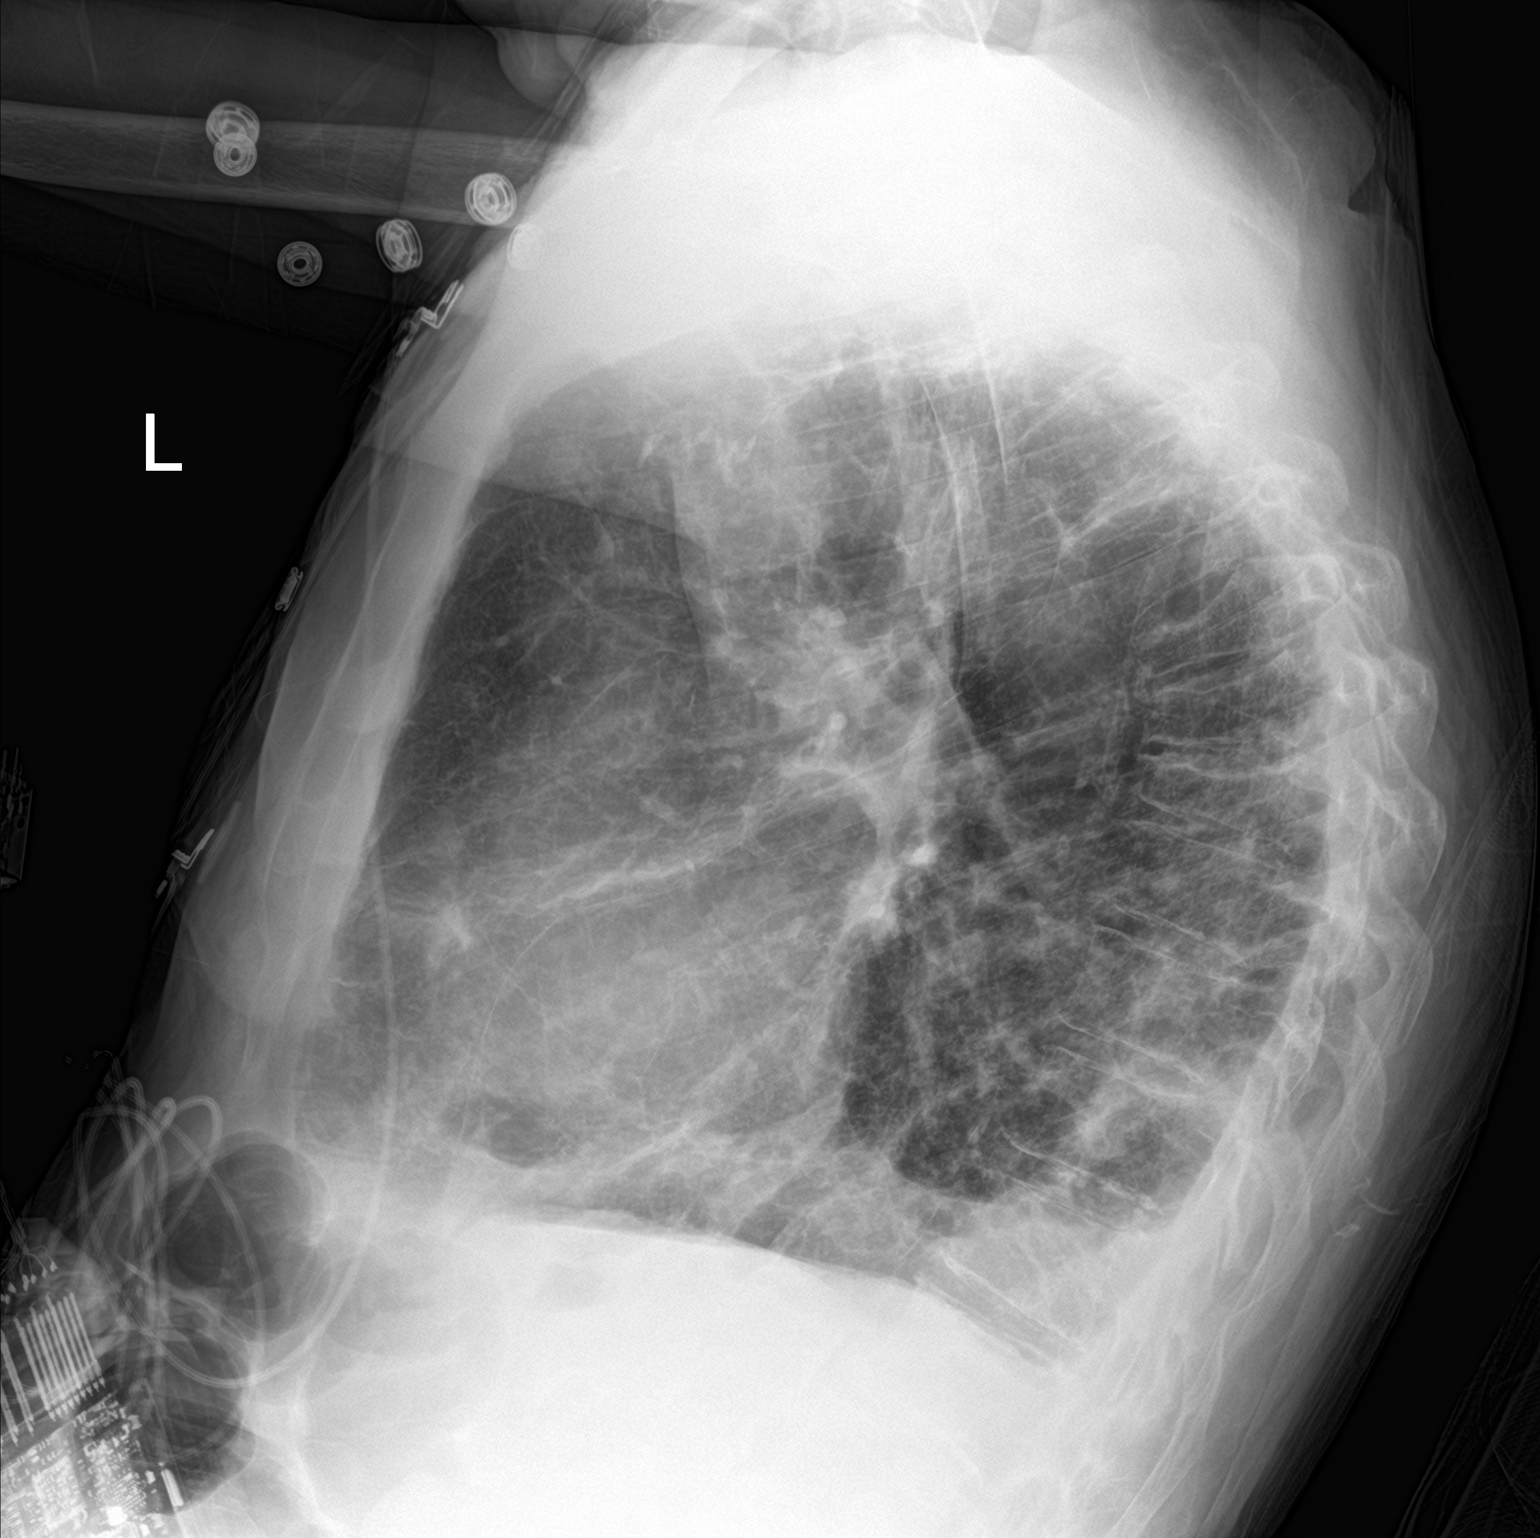

[chest ap]
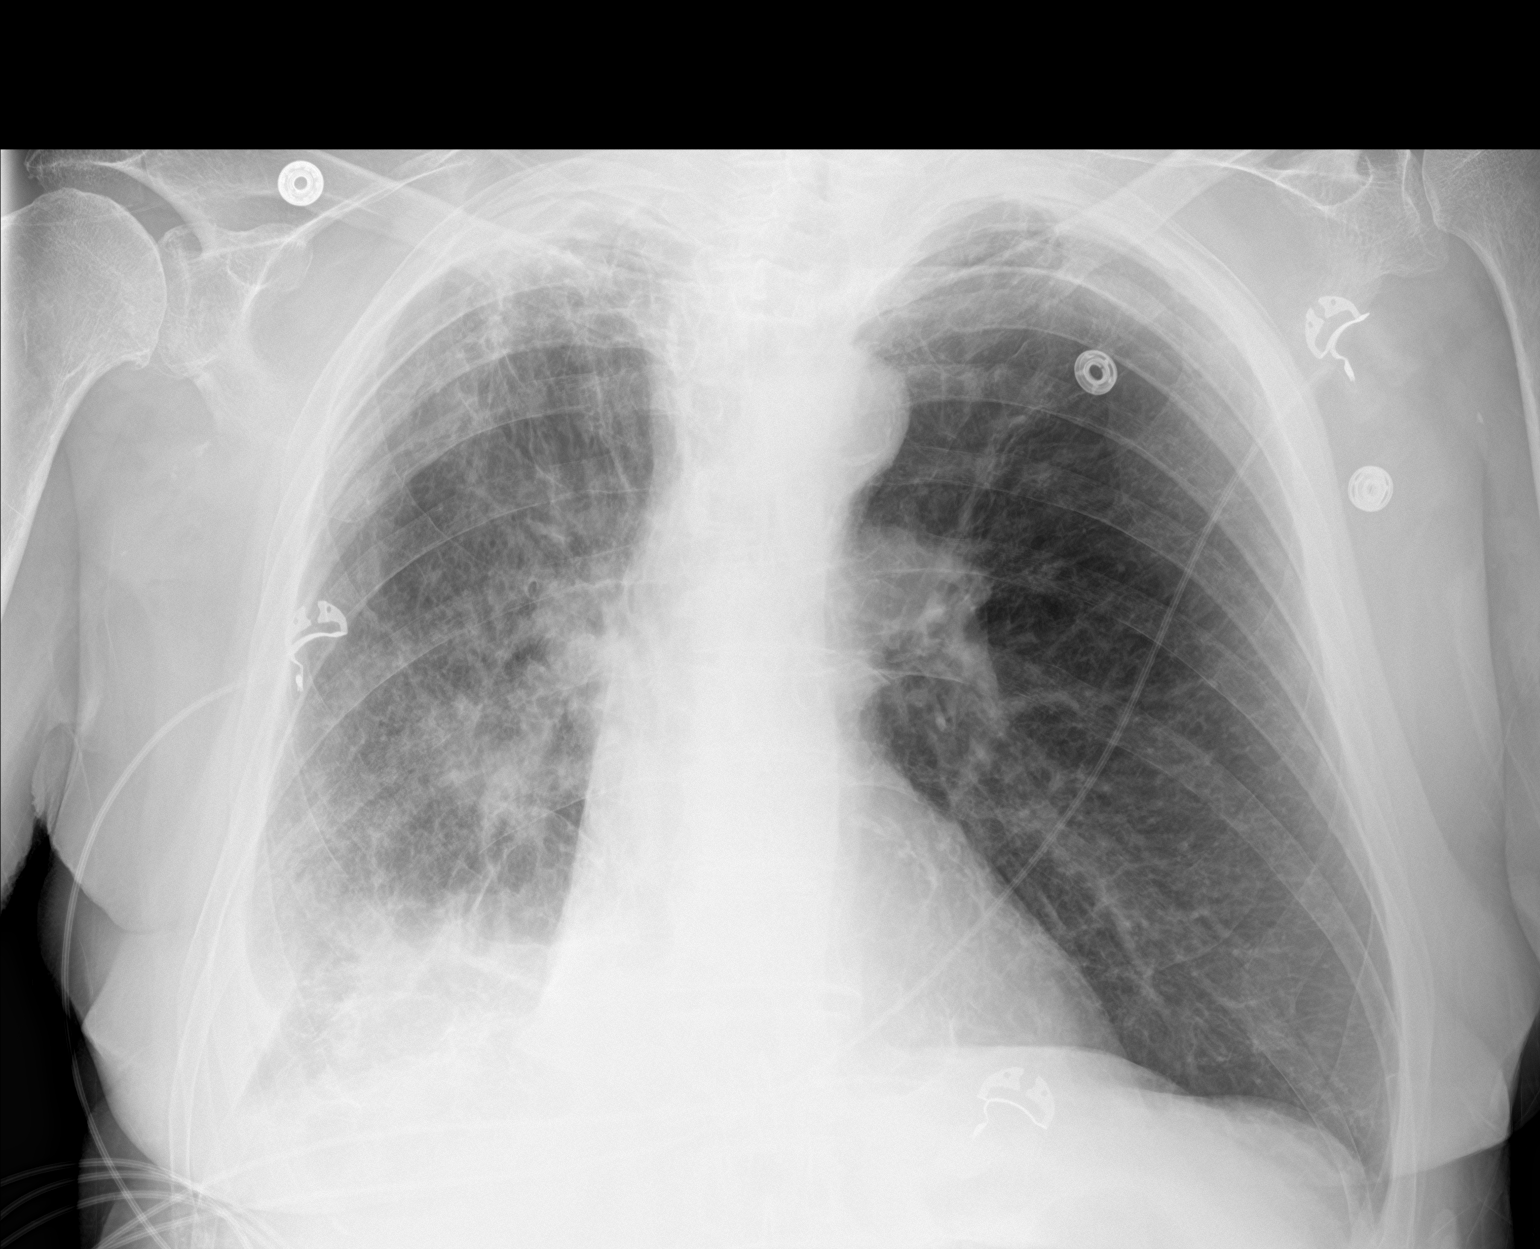

[2 of 2 positions shown; findings below may reference images not displayed]

FINDINGS: Heart size is normal. Chronic aortic atherosclerotic calcification.
The lungs show chronic scarring and emphysema. There is infiltrate
and volume loss in the right lower lobe consistent with pneumonia.
Small amount of right pleural fluid. Findings are slightly worsened
compared to the study of [DATE].
IMPRESSION: Background pattern of emphysema and pulmonary scarring. Slight
radiographic worsening of pneumonia at the right lung base with a
small effusion and right lower lung volume loss.

## 2020-01-26 MED ORDER — POTASSIUM CHLORIDE CRYS ER 20 MEQ PO TBCR
40.0000 meq | EXTENDED_RELEASE_TABLET | Freq: Once | ORAL | Status: AC
  Administered 2020-01-26: 40 meq via ORAL
  Filled 2020-01-26: qty 2

## 2020-01-26 MED ORDER — POLYETHYLENE GLYCOL 3350 17 G PO PACK
17.0000 g | PACK | Freq: Every day | ORAL | Status: DC
  Administered 2020-01-26 – 2020-01-29 (×4): 17 g via ORAL
  Filled 2020-01-26 (×4): qty 1

## 2020-01-26 MED ORDER — FUROSEMIDE 10 MG/ML IJ SOLN
40.0000 mg | Freq: Once | INTRAMUSCULAR | Status: AC
  Administered 2020-01-26: 40 mg via INTRAVENOUS
  Filled 2020-01-26: qty 4

## 2020-01-26 MED ORDER — SENNOSIDES-DOCUSATE SODIUM 8.6-50 MG PO TABS
1.0000 | ORAL_TABLET | Freq: Two times a day (BID) | ORAL | Status: DC
  Administered 2020-01-26 – 2020-01-29 (×6): 1 via ORAL
  Filled 2020-01-26 (×7): qty 1

## 2020-01-26 NOTE — Progress Notes (Signed)
Called by patient's nurse regarding subjective feeling of shortness of breath and wife inking he had increased work of breathing.  Patient has a lengthy pulmonary history.  He apparently called his pulmonary clinic earlier today.  We will have pulmonary see him as a consult today.  I will order a PA and lateral chest x-ray as well as an arterial blood gas.  He is currently on 4 L nasal cannula which is his baseline oxygen dose with overall good saturation.  Ruta Hinds, MD [facility] [place] Office: [phone]

## 2020-01-26 NOTE — Anesthesia Postprocedure Evaluation (Signed)
Anesthesia Post Note  Patient: Francisco [name]  Procedure(s) Performed: LEFT FEMORAL ENDARTERECTOMY WITH DACRON PATCH ANGIOPLASTY (Left Groin) INSERTION OF VBX STENT [id number] AND 8X39 LEFT COMMON ILIAC ARTERY. INSERTION OF INNOVA 7 X 60 INNOVA STENT LEFT EXTERNAL ILIAC ARTERY.  (Left ) ULTRASOUND GUIDANCE FOR VASCULAR ACCESS (Right Groin)     Patient location during evaluation: PACU Anesthesia Type: Spinal Level of consciousness: oriented and awake and alert Pain management: pain level controlled Vital Signs Assessment: post-procedure vital signs reviewed and stable Respiratory status: spontaneous breathing, respiratory function stable and patient connected to nasal cannula oxygen Cardiovascular status: blood pressure returned to baseline and stable Postop Assessment: no headache, no backache and no apparent nausea or vomiting Anesthetic complications: no   No complications documented.  Last Vitals:  Vitals:   01/26/20 0528 01/26/20 0910  BP: 135/74 118/78  Pulse: 100 (!) 118  Resp: 13 16  Temp: 37.1 C 37.1 C  SpO2: 96% 98%    Last Pain:  Vitals:   01/26/20 0910  TempSrc: Oral  PainSc: 7    Pain Goal: Patients Stated Pain Goal: 2 (01/25/20 2100)                 Wayland S

## 2020-01-26 NOTE — Telephone Encounter (Signed)
My chart message from patient. FYI  Francisco [name] is at [facility].  Had vascular surgery this pass Monday for darken toes.  His breathing on exertion has been worse to than when  at home.  Breathing is with congestive rattling.  Did not know whether any of your staff needs to see him while here.  Katy Apo - wife

## 2020-01-26 NOTE — Telephone Encounter (Signed)
Would be beneficial for patient and spouse to make sure that our team on the inpatient side is aware that patient is established with [name] for pulmonary needs.  If they would like a consult with the PCCM team they can simply place that order.  Will route to [name] as Juluis Rainier so he is notified that patient is currently hospitalized due to acute worsening symptoms of his breathing status post surgery.[name], FNP

## 2020-01-26 NOTE — Progress Notes (Signed)
Foley cath d/ced.  Balloon deflated and removed 10cc of water from it prior to d/cing foley.  Patient tolerated removal well.  No c/o pain.  No distress noted.

## 2020-01-26 NOTE — Telephone Encounter (Signed)
Called pt at the number he provided. Lilly answered and states that the pt id in hospital now and pulmonary MD is in seeing him now and addressing his needed. Nothing further needed.

## 2020-01-26 NOTE — Progress Notes (Signed)
[facility] [place]  Subjective  - pt currently in bathroom   Objective 135/74 100 98.8 F (37.1 C) (Oral) 13 96%  Intake/Output Summary (Last 24 hours) at 01/26/2020 0843 Last data filed at 01/26/2020 0600 Gross per 24 hour  Intake 720 ml  Output 425 ml  Net 295 ml   Has some pain in left toe but foot much improved Soreness in groin limits mobility  Assessment/Planning: Resume all home meds Possible d/c tomorrow if ambulatory and pain controlled  Ruta Hinds 01/26/2020 8:43 AM --  Laboratory Lab Results: Recent Labs    01/24/20 2239 01/25/20 0626  WBC 10.6* 16.3*  HGB 7.7* 10.0*  HCT 25.2* 31.5*  PLT 155 160   BMET Recent Labs    01/24/20 0525 01/24/20 0525 01/24/20 1513 01/25/20 0626  NA 136   < > 137 137  K 3.8   < > 3.2* 3.3*  CL 98  --   --  98  CO2 27  --   --  27  GLUCOSE 93  --   --  77  BUN 11  --   --  10  CREATININE 1.05  --   --  1.13  CALCIUM 8.6*  --   --  8.3*   < > = values in this interval not displayed.    COAG Lab Results  Component Value Date   INR 1.0 01/24/2020   INR 1.0 04/19/2019   INR 1.7 (H) 04/15/2019   No results found for: PTT

## 2020-01-26 NOTE — Progress Notes (Signed)
Patients wife concerned about patients breathing. O2 96% on 4L not currently in any distress. Notified [name].  Paulene Floor, RN

## 2020-01-26 NOTE — Progress Notes (Signed)
Physical Therapy Treatment Patient Details Name: Francisco [name] MRN: [id number] DOB: 1946-06-21 Today's Date: 01/26/2020    History of Present Illness 74 y.o. male presentin with L foot rest pain. 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. PMHx significant for PVD, lumbago, COPD on 3L home O2, skin CA, and vertigo.     PT Comments    Pt was seen for mobility but declined to get OOB due to recent mult trips to [facility].  He is willing to do there ex on the bed, and discussed his needs for further therapy interventions.  Pt is expecting to go home, but will need to be more independent.  Follow acutely for LE strength, standing control of balance and gait skills including with use of AD.     Follow Up Recommendations  Home health PT;Supervision/Assistance - 24 hour     Equipment Recommendations  None recommended by PT    Recommendations for Other Services       Precautions / Restrictions Precautions Precautions: Fall Precaution Comments: Monitor HR and sats Restrictions Weight Bearing Restrictions: No    Mobility  Bed Mobility Overal bed mobility: Needs Assistance Bed Mobility: Rolling Rolling: Min guard            Transfers                 General transfer comment: declined  Ambulation/Gait                 Stairs             Wheelchair Mobility    Modified Rankin (Stroke Patients Only)       Balance Overall balance assessment: Needs assistance Sitting-balance support: Feet supported Sitting balance-Leahy Scale: Good                                      Cognition Arousal/Alertness: Awake/alert Behavior During Therapy: WFL for tasks assessed/performed Overall Cognitive Status: Within Functional Limits for tasks assessed                                         Exercises General Exercises - Lower Extremity Ankle Circles/Pumps: AAROM;10 reps Quad Sets: AROM;10 reps Gluteal Sets: AROM;10 reps Heel Slides: AAROM;AROM;10 reps Hip ABduction/ADduction: AAROM;10 reps Straight Leg Raises: AAROM;10 reps Hip Flexion/Marching: AAROM;10 reps    General Comments General comments (skin integrity, edema, etc.): HR 123 max      Pertinent Vitals/Pain Pain Assessment: No/denies pain Pain Score: 5  Pain Location: LLE Pain Descriptors / Indicators: Grimacing Pain Intervention(s): Limited activity within patient's tolerance;Monitored during session;Premedicated before session;Repositioned    Home Living                      Prior Function            PT Goals (current goals can now be found in the care plan section) Acute Rehab PT Goals Patient Stated Goal: to poop Potential to Achieve Goals: Good    Frequency    Min 3X/week      PT Plan Current plan remains appropriate    Co-evaluation  AM-PAC PT "6 Clicks" Mobility   Outcome Measure  Help needed turning from your back to your side while in a flat bed without using bedrails?: A Little Help needed moving from lying on your back to sitting on the side of a flat bed without using bedrails?: A Little Help needed moving to and from a bed to a chair (including a wheelchair)?: A Little Help needed standing up from a chair using your arms (e.g., wheelchair or bedside chair)?: A Little Help needed to walk in hospital room?: A Little Help needed climbing 3-5 steps with a railing? : A Lot 6 Click Score: 17    End of Session Equipment Utilized During Treatment: Oxygen Activity Tolerance: Patient limited by fatigue Patient left: in bed;with call bell/phone within reach;with bed alarm set Nurse Communication: Mobility status PT Visit Diagnosis: Unsteadiness on feet (R26.81);Other abnormalities of gait and mobility (R26.89);Muscle weakness (generalized)  (M62.81);Pain Pain - Right/Left: Left Pain - part of body: Ankle and joints of foot     Time: 5364-6803 PT Time Calculation (min) (ACUTE ONLY): 18 min  Charges:  $Therapeutic Exercise: 8-22 mins $Therapeutic Activity: 8-22 mins                  Ramond Dial 01/26/2020, 9:18 PM  Mee Hives, PT MS Acute Rehab Dept. Number: [place] and [place]

## 2020-01-26 NOTE — Consult Note (Addendum)
NAME:  Francisco [name], MRN:  [id number], DOB:  10-Jan-1946, LOS: 5 ADMISSION DATE:  01/21/2020, CONSULTATION DATE:  7/21 REFERRING MD: [name], CHIEF COMPLAINT:  Shortness of Breath    Brief History   74 y/o M, former smoker, admitted 7/16 for planned abdominal aortogram with lower extremity evaluation in the setting of critical limb ischemia. 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  Subsequently taken for left femoral endarterectomy with placement of Dacron patch, left common iliac artery stent and stent of left external iliac artery.  Post-op course notable for shortness of breath.    History of present illness   74 y/o M, former smoker, with 4L O2 dependent COPD and recent treatment for pseudomonas pneumonia admitted 7/16 for planned abdominal aortogram with lower extremity evaluation in the setting of critical limb ischemia.   The patient reports chronic LE swelling and increasing LLE pain prompting evaluation with [name].  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  Subsequently taken for left femoral endarterectomy with placement of Dacron patch, left common iliac artery stent and stent of left external iliac artery.  Post-op course notable for anemia (hgb 7.7) requiring transfusion on 7/20 (650 ml PRBC).  He received 1L of IVF on 7/20. On 7/21 he reported shortness of breath on 7/21.    The patient reports at baseline he is able to walk to the restroom and back.  He is not able to climb a flight of stairs.  He notes exertional dyspnea that has been worse than usual as he has fully recovered from his pneumonia.  He indicates he feels that he is back to his pre-operative baseline but not his usual baseline.  He reports constipation - has not had a BM in 6 days.  Notes difficulty with urination.  Cough that "feels productive" but does not get anything up.  Denies fevers.  He reports  he feels he would do better if he had a full face mask when he gets short of breath.   PCCM consulted for pulmonary evaluation.   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 rate 16, height 6' (1.829 m), weight 73.8 kg, SpO2 95 %.        Intake/Output Summary (Last 24 hours) at 01/26/2020 1303 Last data filed at 01/26/2020 0600 Gross per 24 hour  Intake 480 ml  Output 425 ml  Net 55 ml   Filed Weights   01/22/20 0508 01/23/20 0428 01/24/20 0658  Weight: 63.8 kg 74.8 kg 73.8 kg    Examination: General: chronically ill appearing adult male lying in bed  in NAD, wife at bedside  HEENT: MM pink/moist, fair dentition, [place] O2, anicteric  Neuro: AAOx4, speech clear, MAE  CV: s1s2 rrr, no m/r/g PULM: non-labored at rest, lungs bilaterally with rhonchi GI: soft, bsx4 active  Extremities / skin: warm/dry, trace edema noted in upper extremities, 3+ pitting edema in BLE, changes consistent with chronic venous stasis, paper thin skin, actinic keratosis noted, purple left great toe, left 5th toe  Resolved Hospital Problem list     Assessment & Plan:   Acute on Chronic Dyspnea   Right  Pleural Effusion  Chronic Obstructive Lung Disease Chronic Hypoxic Respiratory Failure  Hx PE on Chronic Anticoagulation  Admit for lower extremity revascularization.  Recent treatment for RLL pseudomonas pneumonia.  Suspect he had not completely physcially recovered from respiratory illness.  At baseline he is 4L baseline O2, on Breztri, azithro 3x/weekly, daliresp, & prednisone 10 mg.  He recently started following with Palliative Care as outpatient. Acutely, suspect he is volume up from recent transfusion of PRBC and IVF from surgery.  Prior history of diastolic dysfunction.  He is at baseline O2 needs. CXR evaluation shows RLL scarring, atelectasis and suspected effusion on resolving recent pneumoia. Additionally, constipation may be contributing to acute dyspnea.   -wean O2 for sats 88-95% -lasix 40 mg IV x1 with KCL  -will ask if we can obtain a simple face mask for patient comfort  -continue home Brextri, azithromycin, daliresp and prednisone  -push pulmonary hygiene - IS, flutter, mobilize  -follow intermittent CXR  -no indication of acute infection at this time  -bowel regimen as below  -follow I/O's, weight (note increase from 64.4kg to 73.8) -anticoagulation per [facility]VS  Constipation  -add daily miralax, BID senokot-S  Difficulty Voiding  -RN asked to bladder scan patient  -if no voiding / bladder full, he may need repeat catheter / Urology evaluation. Defer to primary     Best practice:  Diet: per primary  DVT prophylaxis: heparin GI prophylaxis: PPI  Glucose control:  Mobility: As tolerated  Code Status: Full Code  Family Communication: Patient and wife updated on plan of care at bedside 7/21  Disposition: per primary   Labs   CBC: Recent Labs  Lab 01/21/20 1545 01/21/20 1545 01/22/20 0922 01/24/20 0525 01/24/20 1513 01/24/20 2239 01/25/20 0626  WBC 13.3*  --  13.0* 9.1  --  10.6* 16.3*  HGB 9.0*   < > 9.2* 7.9* 8.2* 7.7* 10.0*  HCT 30.6*   < > 31.2* 26.2*  24.0* 25.2* 31.5*  MCV 86.4  --  87.2 83.2  --  83.2 86.1  PLT 223  --  188 160  --  155 160   < > = values in this interval not displayed.    Basic Metabolic Panel: Recent Labs  Lab 01/21/20 0954 01/21/20 1545 01/22/20 0922 01/24/20 0525 01/24/20 1513 01/25/20 0626  NA 140  --  137 136 137 137  K 4.7  --  4.6 3.8 3.2* 3.3*  CL 100  --  101 98  --  98  CO2  --   --  26 27  --  27  GLUCOSE 101*  --  86 93  --  77  BUN 19  --  12 11  --  10  CREATININE 1.00 1.12 1.04 1.05  --  1.13  CALCIUM  --   --  8.7* 8.6*  --  8.3*   GFR: Estimated Creatinine Clearance: 61.7 mL/min (by C-G formula based on SCr of  1.13 mg/dL). Recent Labs  Lab 01/22/20 0922 01/24/20 0525 01/24/20 2239 01/25/20 0626  WBC 13.0* 9.1 10.6* 16.3*    Liver Function Tests: Recent Labs  Lab 01/22/20 0922  AST 25  ALT 40  ALKPHOS 113  BILITOT 0.8  PROT 5.6*  ALBUMIN 2.6*   No results for input(s): LIPASE, AMYLASE in the last 168 hours. No results for input(s): AMMONIA in the last 168 hours.  ABG    Component Value Date/Time   PHART 7.446 01/26/2020 1229   PCO2ART 43.0 01/26/2020 1229   PO2ART 79.1 (L) 01/26/2020 1229   HCO3 29.2 (H) 01/26/2020 1229   TCO2 37 (H) 01/24/2020 1513   O2SAT 96.4 01/26/2020 1229     Coagulation Profile: Recent Labs  Lab 01/24/20 0525  INR 1.0    Cardiac Enzymes: No results for input(s): CKTOTAL, CKMB, CKMBINDEX, TROPONINI in the last 168 hours.  HbA1C: Hgb A1c MFr Bld  Date/Time Value Ref Range Status  06/02/2019 11:05 AM 4.9 4.6 - 6.5 % Final    Comment:    Glycemic Control Guidelines for People with Diabetes:Non Diabetic:  <6%Goal of Therapy: <7%Additional Action Suggested:  >8%     CBG: No results for input(s): GLUCAP in the last 168 hours.  Review of Systems: Positives in [place]   Gen: Denies fever, chills, weight change, fatigue, night sweats HEENT: Denies blurred vision, double vision, hearing loss, tinnitus, sinus congestion, rhinorrhea, sore  throat, neck stiffness, dysphagia PULM: Denies shortness of breath, cough, sputum production, hemoptysis, wheezing CV: Denies chest pain, edema, orthopnea, paroxysmal nocturnal dyspnea, palpitations GI: Denies abdominal pain, nausea, vomiting, diarrhea, hematochezia, melena, constipation, change in bowel habits GU: Denies dysuria, hematuria, polyuria, oliguria, urethral discharge Endocrine: Denies hot or cold intolerance, polyuria, polyphagia or appetite change Derm: Denies rash, dry skin, scaling or peeling skin change Heme: Denies easy bruising, bleeding, bleeding gums Neuro: Denies headache, numbness, weakness, slurred speech, loss of memory or consciousness   Past Medical History  He,  has a past medical history of Chest x-ray abnormality, Chronic back pain, Chronic rhinitis, Compressed spine fracture (HCC), COPD (chronic obstructive pulmonary disease) (HCC), Dyspnea, Emphysema lung ([place]), Emphysema of lung ([place]), On home oxygen therapy, Onychomycosis, Orthostatic hypotension, PAD (peripheral artery disease) ([place]), Pneumonia, Pulmonary embolism ([place]) (04/07/2018), Skin cancer, and Vertigo.   Surgical History    Past Surgical History:  Procedure Laterality Date  . ABDOMINAL AORTOGRAM W/LOWER EXTREMITY N/A 01/21/2020   Procedure: ABDOMINAL AORTOGRAM W/LOWER EXTREMITY;  Surgeon: Elam Dutch, MD;  Location: [place] [facility];  Service: Cardiovascular;  Laterality: N/A;  . APPENDECTOMY    . CATARACT EXTRACTION, BILATERAL    . ENDARTERECTOMY FEMORAL Right 04/19/2019   Procedure: ENDARTERECTOMY RIGHT COMMON FEMORAL;  Surgeon: Elam Dutch, MD;  Location: [place];  Service: Vascular;  Laterality: Right;  . ENDARTERECTOMY FEMORAL Left 01/24/2020   Procedure: LEFT FEMORAL ENDARTERECTOMY WITH DACRON PATCH ANGIOPLASTY;  Surgeon: Elam Dutch, MD;  Location: [place];  Service: Vascular;  Laterality: Left;  . FEMORAL ENDARTERECTOMY Left 01/24/2020  . INSERTION OF ILIAC STENT Left  01/24/2020   Procedure: INSERTION OF VBX STENT 8X59 AND 8X39 LEFT COMMON ILIAC ARTERY. INSERTION OF INNOVA 7 X 60 INNOVA STENT LEFT EXTERNAL ILIAC ARTERY. ;  Surgeon: Elam Dutch, MD;  Location: [place];  Service: Vascular;  Laterality: Left;  . LOWER EXTREMITY ANGIOGRAPHY  12/11/2018  . LOWER EXTREMITY ANGIOGRAPHY N/A 12/11/2018   Procedure: LOWER EXTREMITY ANGIOGRAPHY;  Surgeon: Elam Dutch, MD;  Location: [facility]  INVASIVE CV LAB;  Service: Cardiovascular;  Laterality: N/A;  . PATCH ANGIOPLASTY Right 04/19/2019   Procedure: Patch Angioplasty;  Surgeon: Elam Dutch, MD;  Location: [place];  Service: Vascular;  Laterality: Right;  . PERIPHERAL VASCULAR INTERVENTION Right 12/11/2018   Procedure: PERIPHERAL VASCULAR INTERVENTION;  Surgeon: Elam Dutch, MD;  Location: [place] [facility];  Service: Cardiovascular;  Laterality: Right;  Common Iliac   . SKIN CANCER EXCISION     "lips, face, ears, arms" (04/07/2018)  . TONSILLECTOMY    . ULTRASOUND GUIDANCE FOR VASCULAR ACCESS Right 01/24/2020   Procedure: ULTRASOUND GUIDANCE FOR VASCULAR ACCESS;  Surgeon: Elam Dutch, MD;  Location: [facility] OR;  Service: Vascular;  Laterality: Right;     Social History   reports that he quit smoking about 2 years ago. His smoking use included pipe and cigarettes. He started smoking about 54 years ago. He has a 104.00 pack-year smoking history. He has never used smokeless tobacco. He reports current alcohol use of about 28.0 standard drinks of alcohol per week. He reports that he does not use drugs.   Family History   His family history includes Cancer in his mother; Coronary artery disease in an other family member; Heart disease in his father and mother; Hepatitis in his sister; Stroke in his father.   Allergies Allergies  Allergen Reactions  . Tape Other (See Comments)    SKIN IS VERY THIN, TEARS SKIN; CAN ONLY USE COBAN WRAPS DUE TO CONDITION OF SKIN!!  . Ciprofloxacin Nausea Only    Sick on  stomach, weak/tired  . Levaquin [Levofloxacin]     hallucinations     Home Medications  Prior to Admission medications   Medication Sig Start Date End Date Taking? Authorizing Provider  acetaminophen (TYLENOL) 500 MG tablet Take 1,000 mg by mouth every 6 (six) hours as needed for moderate pain.    Yes [provider]  albuterol (VENTOLIN HFA) 108 (90 Base) MCG/ACT inhaler USE 2 PUFFS EVERY 6 HOURS  AS NEEDED FOR WHEEZING Patient taking differently: Inhale 2 puffs into the lungs every 6 (six) hours as needed for wheezing or shortness of breath.  07/30/19  Yes Martyn Ehrich, NP  azithromycin (ZITHROMAX) 250 MG tablet Take 1 tablet (250 mg total) by mouth every Monday, Wednesday, and Friday. 04/30/19  Yes Lauraine Rinne, NP  Budeson-Glycopyrrol-Formoterol (BREZTRI AEROSPHERE) 160-9-4.8 MCG/ACT AERO Inhale 2 puffs into the lungs 2 (two) times daily. 11/19/19  Yes Lauraine Rinne, NP  Budeson-Glycopyrrol-Formoterol (BREZTRI AEROSPHERE) 160-9-4.8 MCG/ACT AERO Inhale 2 puffs into the lungs in the morning and at bedtime. Rinse mouth after each use. 01/13/20  Yes Lauraine Rinne, NP  Calcium Carbonate-Vitamin D (CALCIUM 600+D PO) Take 2 tablets by mouth daily.   Yes [provider]  cycloSPORINE (RESTASIS) 0.05 % ophthalmic emulsion Place 1 drop into both eyes 2 (two) times daily as needed (dry eyes).    Yes [provider]  fluticasone (FLONASE) 50 MCG/ACT nasal spray Place 2 sprays into both nostrils daily. 11/29/19  Yes [name], MD  furosemide (LASIX) 20 MG tablet TAKE 1 TABLET BY MOUTH  DAILY Patient taking differently: Take 20 mg by mouth daily.  11/29/19  Yes [name], MD  OXYGEN Inhale 3 L into the lungs continuous.    Yes [provider]  predniSONE (DELTASONE) 10 MG tablet Take 1 tablet (10 mg total) by mouth daily with breakfast. 09/21/19  Yes Lauraine Rinne, NP  predniSONE (DELTASONE)  10 MG tablet Take 3 tablets (30 mg total) by mouth daily with  breakfast for 10 days, THEN 2 tablets (20 mg total) daily with breakfast for 10 days, THEN 1 tablet (10 mg total) daily with breakfast for 10 days. Take 2 tablets (20mg  total) daily for the next 5 days. Take in the AM with food.. 01/05/20 02/04/20 Yes Lauraine Rinne, NP  Respiratory Therapy Supplies (FLUTTER) DEVI 10 times Twice a day and prn as needed, may increase if feeling worse 10/15/19  Yes Lauraine Rinne, NP  roflumilast (DALIRESP) 500 MCG TABS tablet Take 1 tablet (500 mcg total) by mouth daily. 08/02/19  Yes Brand Males, MD  sodium chloride HYPERTONIC 3 % nebulizer solution Take by nebulization daily. 11/11/19  Yes Brand Males, MD  traMADol (ULTRAM) 50 MG tablet Take 1 tablet (50 mg total) by mouth every 12 (twelve) hours as needed for moderate pain or severe pain. 11/29/19  Yes [name], MD  budesonide-formoterol [facility]) 160-4.5 MCG/ACT inhaler Inhale 2 puffs into the lungs 2 (two) times daily. Patient not taking: Reported on 01/21/2020 08/30/19   [name], MD  rivaroxaban (XARELTO) 20 MG TABS tablet TAKE 1 TABLET BY MOUTH  DAILY Patient taking differently: Take 20 mg by mouth daily.  06/02/19   [name], MD  SPIRIVA RESPIMAT 2.5 MCG/ACT AERS USE 2 INHALATIONS BY MOUTH  ONCE DAILY Patient not taking: Reported on 01/12/2020 03/03/19   Brand Males, MD     Critical care time: n/a     Noe Gens, MSN, NP-C [facility] 01/26/2020, 1:03 PM   Please see [email or web address] for pager details.

## 2020-01-26 NOTE — Evaluation (Signed)
Occupational Therapy Evaluation Patient Details Name: Francisco [name] MRN: [id number] DOB: [birth year] Today's Date: 01/26/2020    History of Present Illness 74 y.o. male presentin with L foot rest pain. 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. PMHx significant for PVD, lumbago, COPD on 3L home O2, skin CA, and vertigo.    Clinical Impression   PTA, patient was living with his spouse in a private residence and was independent with BADLs. Wife responsible for IADLs including meal prep and homemaking tasks. Patient reports standing to shower independently ~6 weeks ago but for the past few weeks has been sponge bathing 2/2 pain in LLE. Patient currently presents below baseline level of function requiring Min guard for functional mobility with RW, Min A for toilet transfers, and Min to Mod A for LB BADLs. Patient would benefit from continued acute OT services to maximize safety and independence with self-care tasks in prep for return to PLOF. Recommendation for HHOT in prep for safe return to PLOF.     Follow Up Recommendations  Home health OT    Equipment Recommendations  None recommended by OT    Recommendations for Other Services       Precautions / Restrictions Precautions Precautions: Fall Restrictions Weight Bearing Restrictions: No      Mobility Bed Mobility Overal bed mobility: Needs Assistance Bed Mobility: Sit to Supine       Sit to supine: Mod assist (Increased time/effort)   General bed mobility comments: Mod A to assist BLE from EOB to bed surface  Transfers Overall transfer level: Needs assistance Equipment used: Rolling walker (2 wheeled) Transfers: Sit to/from [facility] Sit to Stand: Min assist Stand pivot transfers: Min assist       General transfer comment: Sit to stand from [facility]  with Min A and cues for hand placement and SPT to EOB with Min A.     Balance Overall balance assessment: Needs assistance Sitting-balance support: No upper extremity supported;Feet supported Sitting balance-Leahy Scale: Fair     Standing balance support: Bilateral upper extremity supported;During functional activity Standing balance-Leahy Scale: Poor                             ADL either performed or assessed with clinical judgement   ADL Overall ADL's : Needs assistance/impaired     Grooming: Set up;Sitting   Upper Body Bathing: Minimal assistance;Sitting   Lower Body Bathing: Moderate assistance;Sitting/lateral leans;Sit to/from stand   Upper Body Dressing : Minimal assistance;Sitting   Lower Body Dressing: Moderate assistance;Sitting/lateral leans;Sit to/from stand   Toilet Transfer: Minimal assistance;BSC;RW Toilet Transfer Details (indicate cue type and reason): Pt. met seated on commode. Min A for STS from [facility] to RW with increased time/effort.          Functional mobility during ADLs: Min guard;Rolling walker General ADL Comments: Short distance from [facility] to EOB with RW and increased time/effort.      Vision Baseline Vision/History: Wears glasses Wears Glasses: Reading only Patient Visual Report: No change from baseline (s/p cataracts removal) Vision Assessment?: No apparent visual deficits     Perception     Praxis      Pertinent Vitals/Pain Pain Assessment: 0-10 Pain Score: 5  Pain Location: L foot and L side at incision Pain Descriptors / Indicators: Throbbing Pain Intervention(s):  Limited activity within patient's tolerance;Monitored during session     Hand Dominance Right   Extremity/Trunk Assessment Upper Extremity Assessment Upper Extremity Assessment: Generalized weakness   Lower Extremity Assessment Lower Extremity Assessment: Generalized weakness   Cervical / Trunk Assessment Cervical / Trunk Assessment: Kyphotic    Communication Communication Communication: No difficulties   Cognition Arousal/Alertness: Awake/alert Behavior During Therapy: WFL for tasks assessed/performed Overall Cognitive Status: Within Functional Limits for tasks assessed                                     General Comments  Vitals: HR 130's at rest. SpO2 98% on 4L O2. Desat to 86% on 4L with ambulation short distance to EOB requiring increased rest break. HR 150's with light activity.     Exercises     Shoulder Instructions      Home Living Family/patient expects to be discharged to:: Private residence Living Arrangements: Spouse/significant other Available Help at Discharge: Family;Available 24 hours/day Type of Home: House Home Access: Stairs to enter [employer] of Steps: 2 Entrance Stairs-Rails: None Home Layout: One level     Bathroom Shower/Tub: Tub/shower unit;Walk-in shower   Bathroom Toilet: Standard     Home Equipment: Environmental consultant - 2 wheels;Shower seat;Bedside commode;Walker - 4 wheels          Prior Functioning/Environment Level of Independence: Needs assistance  Gait / Transfers Assistance Needed: Modified independent short distances with walker              OT Problem List: Decreased strength;Decreased activity tolerance;Impaired balance (sitting and/or standing);Decreased safety awareness;Decreased knowledge of use of DME or AE;Pain      OT Treatment/Interventions: Self-care/ADL training;Therapeutic exercise;Energy conservation;DME and/or AE instruction;Therapeutic activities;Balance training    OT Goals(Current goals can be found in the care plan section) Acute Rehab OT Goals Patient Stated Goal: To be able to poop OT Goal Formulation: With patient Time For Goal Achievement: 02/09/20 Potential to Achieve Goals: Good ADL Goals Pt Will Perform Lower Body Bathing: with supervision;with adaptive equipment;sitting/lateral leans;sit to/from stand Pt Will Perform  Lower Body Dressing: with supervision;with adaptive equipment;sitting/lateral leans;sit to/from stand Pt Will Transfer to Toilet: with modified independence;ambulating;bedside commode Pt Will Perform Toileting - Clothing Manipulation and hygiene: with modified independence;with adaptive equipment;sitting/lateral leans;sit to/from stand Pt/caregiver will Perform Home Exercise Program: Both right and left upper extremity;Increased strength;With written HEP provided Additional ADL Goal #1: Patient will incorporate 3 energy conservation strategies in prep for safe completion of BADLs.  OT Frequency: Min 2X/week   Barriers to D/C:            Co-evaluation              AM-PAC OT "6 Clicks" Daily Activity     Outcome Measure Help from another person eating meals?: A Little Help from another person taking care of personal grooming?: A Little Help from another person toileting, which includes using toliet, bedpan, or urinal?: A Lot Help from another person bathing (including washing, rinsing, drying)?: A Lot Help from another person to put on and taking off regular upper body clothing?: A Little Help from another person to put on and taking off regular lower body clothing?: A Lot 6 Click Score: 15   End of Session Equipment Utilized During Treatment: Gait belt;Rolling walker Nurse Communication: Mobility status  Activity Tolerance: Patient limited by fatigue;Patient limited by pain (Increased coaxing/encouragement for participation ) Patient left: in  bed;with call bell/phone within reach;with bed alarm set  OT Visit Diagnosis: Unsteadiness on feet (R26.81);History of falling (Z91.81);Muscle weakness (generalized) (M62.81);Pain Pain - Right/Left: Left Pain - part of body: Leg;Ankle and joints of foot                Time: 8685-4883 OT Time Calculation (min): 30 min Charges:  OT General Charges $OT Visit: 1 Visit OT Evaluation $OT Eval Moderate Complexity: 1 Mod OT Treatments $Self  Care/Home Management : 8-22 mins  Makhya Arave H. OTR/L Supplemental OT, Department of rehab services [phone]  Abed Schar R H. 01/26/2020, 9:08 AM

## 2020-01-26 NOTE — Progress Notes (Signed)
Patient got up to bedside commode. HR went up to 160's patient felt SOB and dizzy. Notified [name]. Ordered troponin's and an EKG. Patient HR now in 110's. Era Bumpers, RN

## 2020-01-26 NOTE — Progress Notes (Signed)
Mobility Specialist - Progress Note   01/26/20 1556  Mobility  Activity Refused mobility  Mobility performed by Mobility specialist   Pt refused mobility due to feeling fatigued.  Pricilla Handler Mobility Specialist Mobility Specialist Phone: [phone]

## 2020-01-27 ENCOUNTER — Inpatient Hospital Stay (HOSPITAL_COMMUNITY): Payer: Medicare Other

## 2020-01-27 DIAGNOSIS — J151 Pneumonia due to Pseudomonas: Secondary | ICD-10-CM

## 2020-01-27 DIAGNOSIS — J9621 Acute and chronic respiratory failure with hypoxia: Secondary | ICD-10-CM

## 2020-01-27 LAB — CBC
HCT: 30.4 % — ABNORMAL LOW (ref 39.0–52.0)
Hemoglobin: 9.4 g/dL — ABNORMAL LOW (ref 13.0–17.0)
MCH: 27.2 pg (ref 26.0–34.0)
MCHC: 30.9 g/dL (ref 30.0–36.0)
MCV: 87.9 fL (ref 80.0–100.0)
Platelets: 157 10*3/uL (ref 150–400)
RBC: 3.46 MIL/uL — ABNORMAL LOW (ref 4.22–5.81)
RDW: 17 % — ABNORMAL HIGH (ref 11.5–15.5)
WBC: 11.4 10*3/uL — ABNORMAL HIGH (ref 4.0–10.5)
nRBC: 0 % (ref 0.0–0.2)

## 2020-01-27 LAB — BASIC METABOLIC PANEL
Anion gap: 9 (ref 5–15)
BUN: 9 mg/dL (ref 8–23)
CO2: 30 mmol/L (ref 22–32)
Calcium: 8 mg/dL — ABNORMAL LOW (ref 8.9–10.3)
Chloride: 97 mmol/L — ABNORMAL LOW (ref 98–111)
Creatinine, Ser: 1.07 mg/dL (ref 0.61–1.24)
GFR calc Af Amer: >60 mL/min (ref >60)
GFR calc non Af Amer: >60 mL/min (ref >60)
Glucose, Bld: 132 mg/dL — ABNORMAL HIGH (ref 70–99)
Potassium: 4.3 mmol/L (ref 3.5–5.1)
Sodium: 136 mmol/L (ref 135–145)

## 2020-01-27 LAB — EXPECTORATED SPUTUM ASSESSMENT W GRAM STAIN, RFLX TO RESP C

## 2020-01-27 IMAGING — DX DG CHEST 1V PORT
1 series · 2 of 2 positions shown · non-contrast
Comparison: [DATE]

CLINICAL DATA: Follow-up pleural effusion

EXAM:
PORTABLE CHEST 1 VIEW

[Series 1: chest · 0.14mm/px · 2 of 2 slices shown]
[im 1/2]
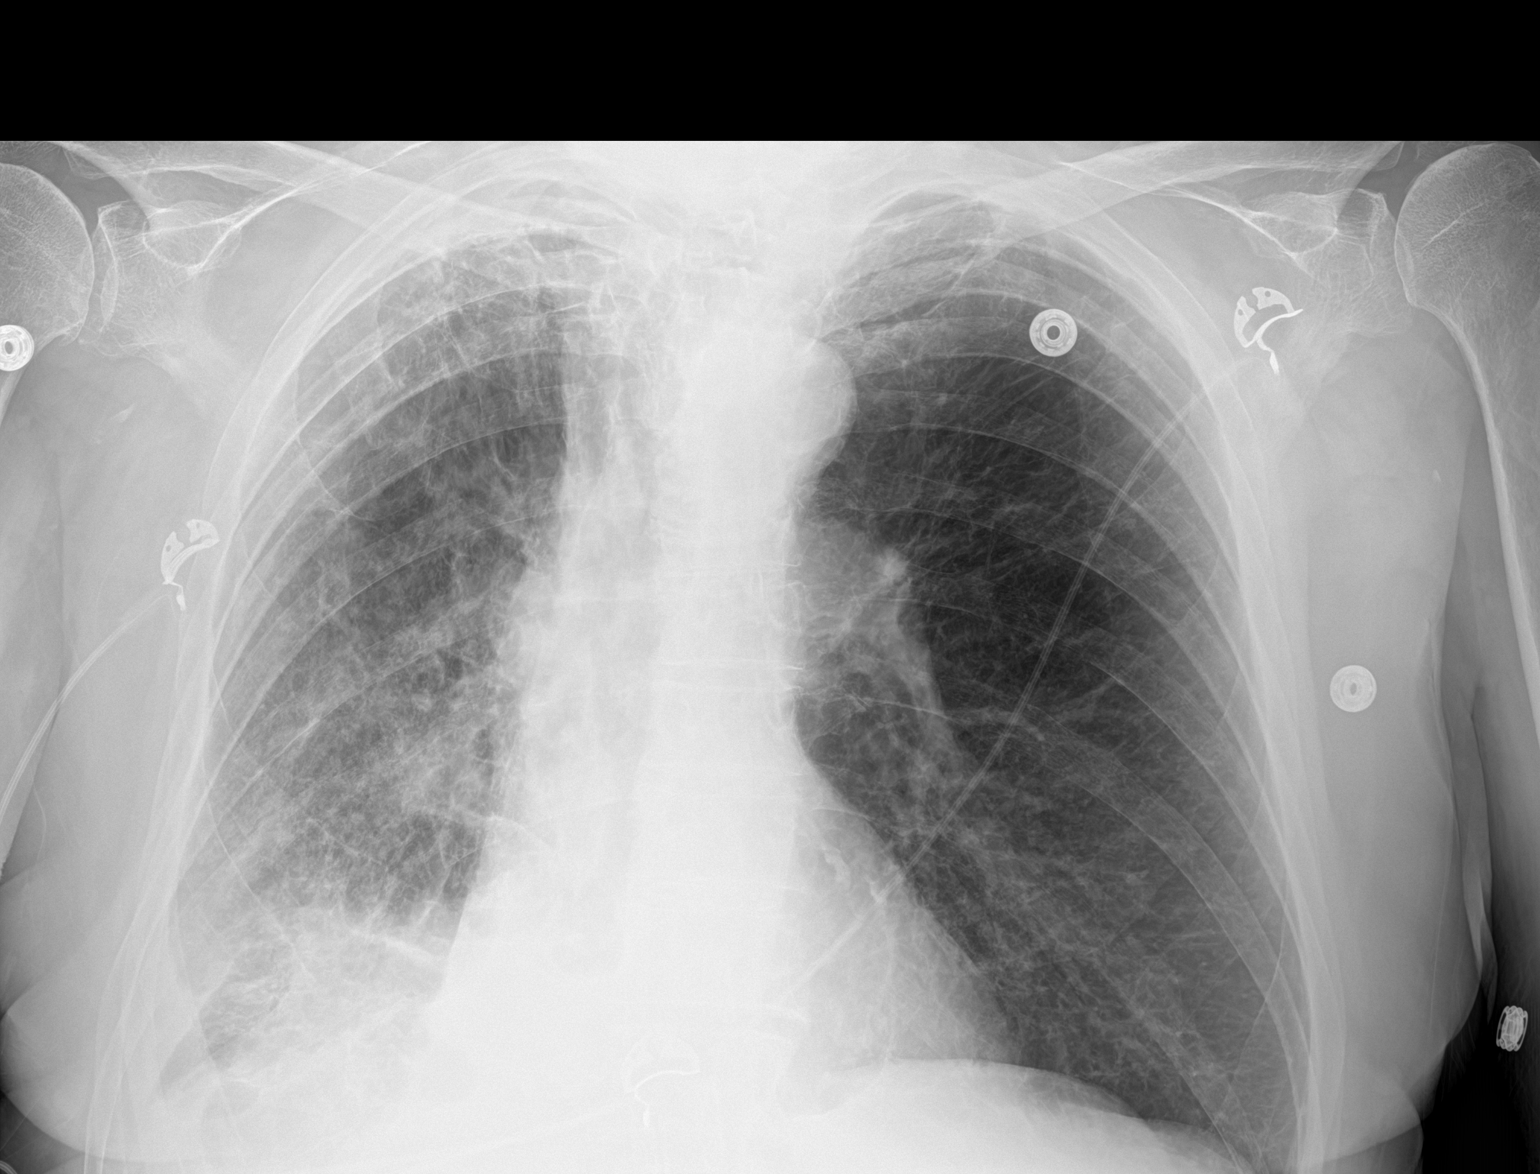
[im 2/2]
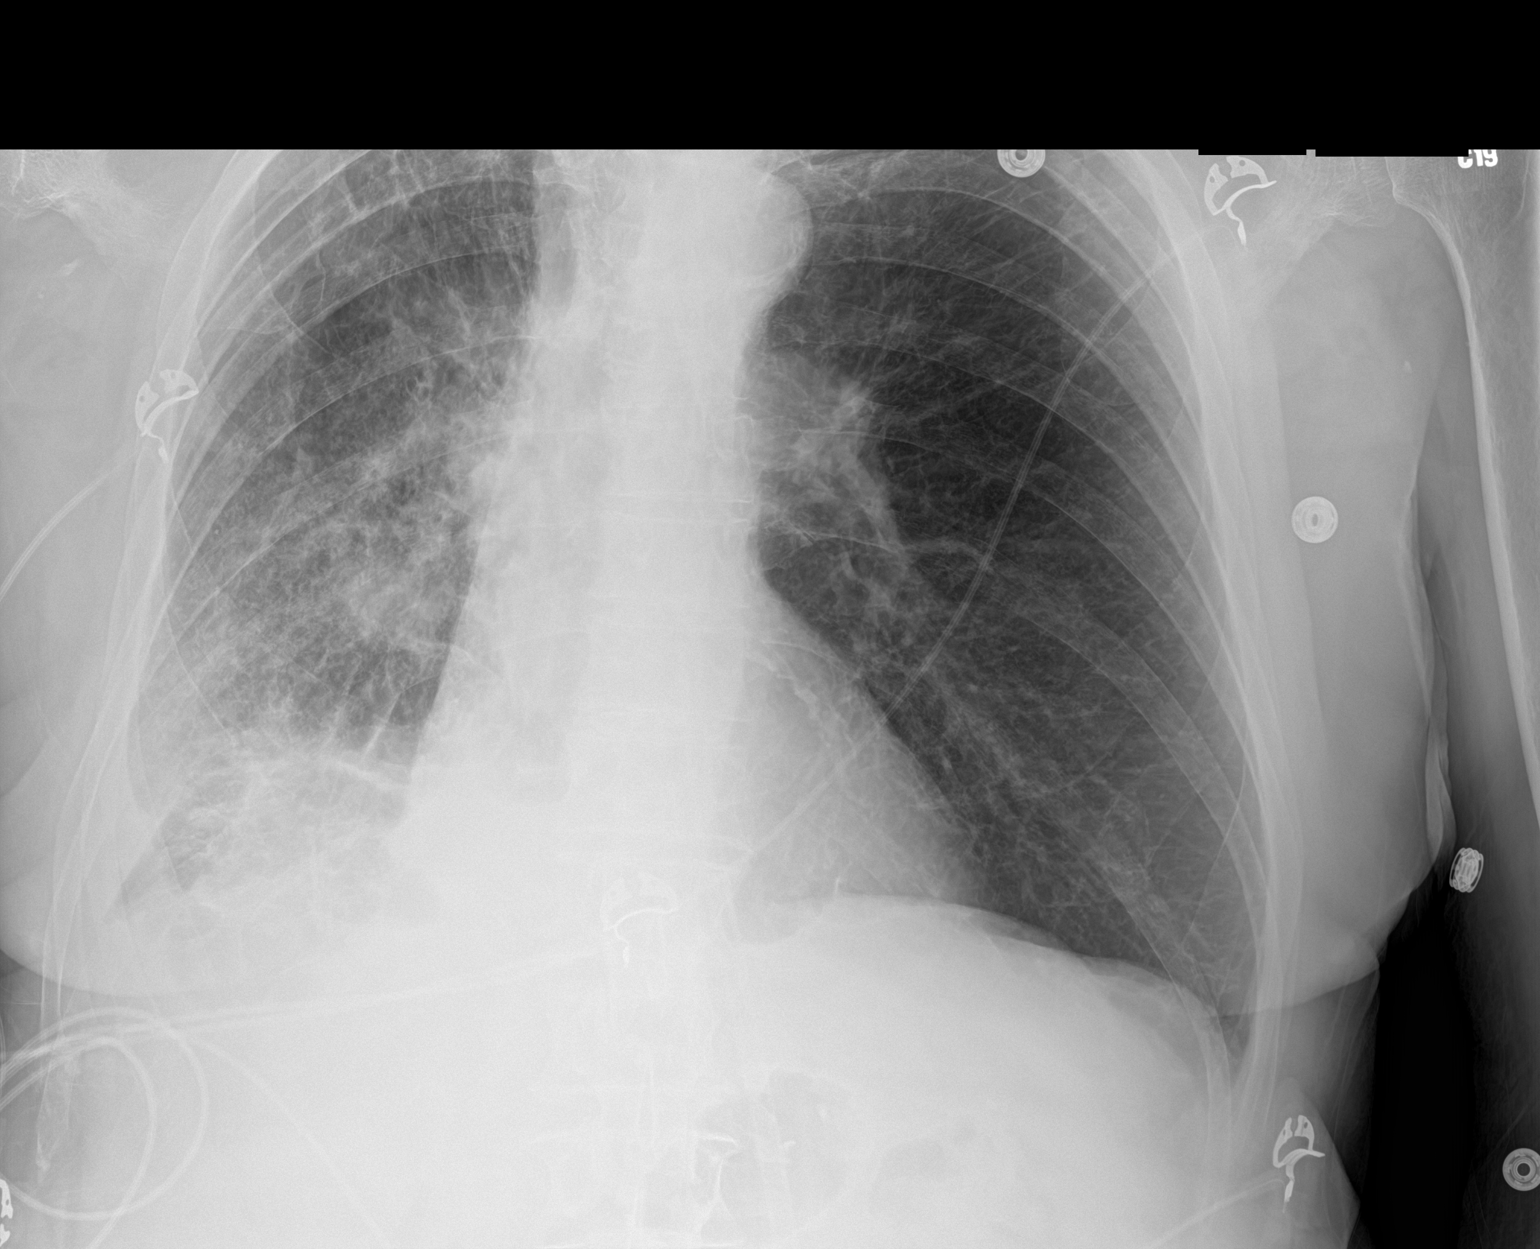

[2 of 2 positions shown; findings below may reference images not displayed]

FINDINGS: Cardiac shadow is stable. Diffuse chronic scarring is noted
particularly on the right. The right basilar airspace opacity
consistent with acute infiltrate is stable. Small right effusion is
noted. Left lung is hyperinflated. No bony abnormality is seen.
IMPRESSION: Stable right basilar infiltrate and effusion.

## 2020-01-27 MED ORDER — FLEET ENEMA 7-19 GM/118ML RE ENEM
1.0000 | ENEMA | Freq: Once | RECTAL | Status: AC
  Administered 2020-01-27: 1 via RECTAL
  Filled 2020-01-27: qty 1

## 2020-01-27 NOTE — Progress Notes (Signed)
Physical Therapy Treatment Patient Details Name: Francisco [name] MRN: [id number] DOB: [birth year] Today's Date: 01/27/2020    History of Present Illness Pt is a 74 y.o. male admitted 01/21/20 with c/o L foot pain. 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 PMH includes PVD, lumbago, COPD (3L home O2), skin CA, vertigo.   PT Comments    Pt limited by pain and constipation this session. Able to transfer from bed<>BSC with assist from wife; declined further mobility with PT. Increased time discussing home set-up, assist level and DME needs. Wife present and supportive, confident pt will have adequate support from family upon d/c. HEP handout provided, pt and wife receptive of education. Pt hopeful for d/c home tomorrow.   Follow Up Recommendations  Home health PT;Supervision/Assistance - 24 hour     Equipment Recommendations  None recommended by PT    Recommendations for Other Services       Precautions / Restrictions Precautions Precautions: Fall Precaution Comments: Significant LLE pain Restrictions Weight Bearing Restrictions: No    Mobility  Bed Mobility               General bed mobility comments: Received sitting on BSC  Transfers Overall transfer level: Needs assistance Equipment used: Rolling walker (2 wheeled)             General transfer comment: Declined transfer with PT; pt able to transfer from [facility] to EOB with HHA from wife  Ambulation/Gait             General Gait Details: Declined   Stairs             Wheelchair Mobility    Modified Rankin (Stroke Patients Only)       Balance Overall balance assessment: Needs assistance Sitting-balance support: Feet supported Sitting balance-Leahy Scale: Good                                      Cognition Arousal/Alertness:  Awake/alert Behavior During Therapy: WFL for tasks assessed/performed Overall Cognitive Status: Within Functional Limits for tasks assessed                                        Exercises Other Exercises Other Exercises: HEP handout provided ([place]) - pt listening to education and wife practicing exercises with PT - included LAQ, seated hip flex, seated HS stretch, seated calf stretch with strap    General Comments General comments (skin integrity, edema, etc.): Wife present and supportive. Increased time discussing home set-up, assist level and DME needs; has 2 steps to enter home which pt reports son can help him up. Also has w/c if needed. Wife confident that family can provide necessary assist      Pertinent Vitals/Pain Pain Assessment: Faces Faces Pain Scale: Hurts whole lot Pain Location: LLE and abdomen (constipation) Pain Descriptors / Indicators: Grimacing;Guarding Pain Intervention(s): Limited activity within patient's tolerance    Home Living                      Prior Function            PT Goals (current goals can now be found in the care  plan section) Progress towards PT goals: Not progressing toward goals - comment (Limited by pain and constipation)    Frequency           PT Plan Current plan remains appropriate    Co-evaluation              AM-PAC PT "6 Clicks" Mobility   Outcome Measure  Help needed turning from your back to your side while in a flat bed without using bedrails?: A Little Help needed moving from lying on your back to sitting on the side of a flat bed without using bedrails?: A Little Help needed moving to and from a bed to a chair (including a wheelchair)?: A Little Help needed standing up from a chair using your arms (e.g., wheelchair or bedside chair)?: A Little Help needed to walk in hospital room?: A Little Help needed climbing 3-5 steps with a railing? : A Lot 6 Click  Score: 17    End of Session Equipment Utilized During Treatment: Oxygen Activity Tolerance: Patient limited by fatigue;No increased pain Patient left: in bed;with call bell/phone within reach;with family/visitor present (seated EOB) Nurse Communication: Mobility status PT Visit Diagnosis: Unsteadiness on feet (R26.81);Other abnormalities of gait and mobility (R26.89);Muscle weakness (generalized) (M62.81);Pain     Time: 3716-9678 PT Time Calculation (min) (ACUTE ONLY): 15 min  Charges:  $Self Care/Home Management: [place], PT, DPT Acute Rehabilitation Services  Pager [phone] Office [phone]  Derry Lory 01/27/2020, 3:37 PM

## 2020-01-27 NOTE — Progress Notes (Addendum)
Pt had dyspnea with exertions, poor tolerated with minimal ambulation from bed to bedside commode, HR 140-160, RR 30, on 5 LPM of O2 NCL,  BP 117/71- 137/70 mmHg, remained afebrile. [name] made aware.RED MEW score presented. Troponin was in normal range. EKG 12 leads showed sinus tachycardia.  Metoprolol given 5 mg IV, pain controlled well with Oxycodone. Pt's HR decreased to 90s after Metoprolol. BP remained within normal limits. RR 20s at rest. SPO2 97-100%. We reduced flow O2 NCL to 4 LPM. Lungs congested and coarse crackles auscultated bilaterally.  We got good signals with doppler on PT and DP pulses bilaterally. Incision on left and right groins appeared dry and clean, no drainage, no hematoma.  We will continue to monitor.  Kennyth Lose, RN

## 2020-01-27 NOTE — Progress Notes (Signed)
NAME:  Francisco [name], MRN:  [id number], DOB:  1945/12/18, LOS: 6 ADMISSION DATE:  01/21/2020, CONSULTATION DATE:  7/21 REFERRING MD: [name], CHIEF COMPLAINT:  Shortness of Breath    Brief History   74 y/o M, former smoker, admitted 7/16 for planned abdominal aortogram with lower extremity evaluation in the setting of critical limb ischemia. 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  Subsequently taken for left femoral endarterectomy with placement of Dacron patch, left common iliac artery stent and stent of left external iliac artery.  Post-op course notable for shortness of breath.    History of present illness   74 y/o M, former smoker, with 4L O2 dependent COPD and recent treatment for pseudomonas pneumonia admitted 7/16 for planned abdominal aortogram with lower extremity evaluation in the setting of critical limb ischemia.   The patient reports chronic LE swelling and increasing LLE pain prompting evaluation with [name].  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  Subsequently taken for left femoral endarterectomy with placement of Dacron patch, left common iliac artery stent and stent of left external iliac artery.  Post-op course notable for anemia (hgb 7.7) requiring transfusion on 7/20 (650 ml PRBC).  He received 1L of IVF on 7/20. On 7/21 he reported shortness of breath on 7/21.    The patient reports at baseline he is able to walk to the restroom and back.  He is not able to climb a flight of stairs.  He notes exertional dyspnea that has been worse than usual as he has fully recovered from his pneumonia.  He indicates he feels that he is back to his pre-operative baseline but not his usual baseline.  He reports constipation - has not had a BM in 6 days.  Notes difficulty with urination.  Cough that "feels productive" but does not get anything up.  Denies fevers.  He reports  he feels he would do better if he had a full face mask when he gets short of breath.   PCCM consulted for pulmonary evaluation.   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 rate 16, height 6' (1.829 m), weight 73.8 kg, SpO2 99 %.        Intake/Output Summary (Last 24 hours) at 01/27/2020 0952 Last data filed at 01/27/2020 0947 Gross per 24 hour  Intake 690 ml  Output 850 ml  Net -160 ml   Filed Weights   01/22/20 0508 01/23/20 0428 01/24/20 0658  Weight: 63.8 kg 74.8 kg  73.8 kg    Examination: General: chronically ill appearing adult male sitting in bed, legs dangling HEENT: MM pink/moist, fair dentition, [place] O2, anicteric  Neuro: AAOx4, speech clear, MAE  CV: s1s2 rrr, no m/r/g PULM: non-labored at rest, lungs bilaterally decreased, no rhonchi GI: soft, bsx4 active  Extremities / skin: warm/dry, trace edema noted in upper extremities, 3+ pitting edema in BLE, changes consistent with chronic venous stasis, paper thin skin, actinic keratosis noted, purple left great toe, left 5th  toe  Resolved Hospital Problem list     Assessment & Plan:   Acute on Chronic Dyspnea   Right Pleural Effusion  Chronic Obstructive Lung Disease Chronic Hypoxic Respiratory Failure  Hx PE on Chronic Anticoagulation  Admit for lower extremity revascularization.  Recent treatment for RLL pseudomonas pneumonia 11/2019.  Suspect he had not completely physcially recovered from respiratory illness.  At baseline he is 4L baseline O2, on Breztri, azithro 3x/weekly, daliresp, & prednisone 10 mg.  He recently started following with Palliative Care as outpatient.   Feels much improved after 1 dose of Lasix  -wean O2 for sats 88-95%, he is back on home dose of 4 L oxygen -continue home Breztri, azithromycin, daliresp and prednisone 10 mg -push pulmonary hygiene - IS, flutter, mobilize  -anticoagulation per VVS   Right lower lobe Pseudomonas pneumonia -Chest x-ray shows stable right basal infiltrate which dates back to 11/2019 and was noted on CT angiogram earlier.  I feel this represents slow to resolve Pseudomonas pneumonia for which he has already been treated.  Leukocytosis is resolving and no other sign of acute infection so we will hold off on more antibiotics.  Of concern is repeated sputum cultures showing gram-negative rods which suggests colonization. We will repeat sputum culture If he has fevers, increased volume of productive phlegm then would have a low threshold to treat him with ciprofloxacin [Levaquin caused dizziness]  He has follow-up appointment with [name] 8/16  Kara Mead MD. [facility]. [facility]  If no response to pager , please call [phone]   01/27/2020

## 2020-01-27 NOTE — Progress Notes (Addendum)
  Progress Note    01/27/2020 7:19 AM 3 Days Post-Op  Subjective:  Sleeping comfortably and awakes easily.  Says his breathing is better.  Says he needs to have a BM.  Doesn't want an enema.   Afebrile HR 90's-140's 628'Z-662'H systolic 476% [id number]   ABX:  Azithromycin   Vitals:   01/27/20 0433 01/27/20 0616  BP: 112/62   Pulse: 97 (!) 108  Resp: 17 (!) 21  Temp: 97.7 F (36.5 C)   SpO2: 98% 100%    Physical Exam: Cardiac:  regular Lungs:  Non labored on 4LO2NC Incisions:  Left groin is clean and dry.  There is some surrounding erythema Extremities:  Brisk left DP doppler signal  Ischemic changes to left great toe and left 5th toe.     CBC    Component Value Date/Time   WBC 11.4 (H) 01/27/2020 0346   RBC 3.46 (L) 01/27/2020 0346   HGB 9.4 (L) 01/27/2020 0346   HCT 30.4 (L) 01/27/2020 0346   PLT 157 01/27/2020 0346   MCV 87.9 01/27/2020 0346   MCH 27.2 01/27/2020 0346   MCHC 30.9 01/27/2020 0346   RDW 17.0 (H) 01/27/2020 0346   LYMPHSABS 1.2 11/29/2019 0926   MONOABS 1.2 (H) 11/29/2019 0926   EOSABS 0.2 11/29/2019 0926   BASOSABS 0.1 11/29/2019 0926    BMET    Component Value Date/Time   NA 136 01/27/2020 0346   K 4.3 01/27/2020 0346   CL 97 (L) 01/27/2020 0346   CO2 30 01/27/2020 0346   GLUCOSE 132 (H) 01/27/2020 0346   BUN 9 01/27/2020 0346   CREATININE 1.07 01/27/2020 0346   CALCIUM 8.0 (L) 01/27/2020 0346   GFRNONAA >60 01/27/2020 0346   GFRAA >60 01/27/2020 0346    INR    Component Value Date/Time   INR 1.0 01/24/2020 0525     Intake/Output Summary (Last 24 hours) at 01/27/2020 0719 Last data filed at 01/26/2020 2100 Gross per 24 hour  Intake 930 ml  Output 700 ml  Net 230 ml     Assessment:  74 y.o. male is s/p:  L CIA/EIA stent and L CFA   3 Days Post-Op  Plan: -brisk doppler signal left DP; left great toe and 5th toe will need to demarcate. -left groin incision with surrounding erythema-pt is on azithromycin.  Leukocytosis  improving and pt is afebrile.   -pt remains on 4LO2NC with O2 sats in the upper 90's.  Per [name] note, Ted Mcalpine is what he is on at home.  He states he feels his breathing is better this morning.  Appreciate pulmonary assistance.  -PT & OT recommending HHPT/OT -pt would like for [name] to call pt's wife if she is not here when he rounds. -acute blood loss anemia stable. -constipation-feels he will have a BM soon.  Does not want an enema.   -DVT prophylaxis:  Sq heparin.  Will defer to [name] when to restart Xarelto.    Leontine Locket, PA-C [facility] [phone] 01/27/2020 7:19 AM  Feels better today, left foot warm tip of toes 1 and 5 early gangrene Tachycardia last night troponin negative Breathing better today appreciate pulmonary assistance Will keep in hospital today.   D/c am if pain controlled and pulmonary function reasonable Still waiting for BM  Ruta Hinds, MD [facility] [name] Office: [phone]

## 2020-01-27 NOTE — Progress Notes (Signed)
Patient has  several attempts to have a BM. C/o of pain and discomfort.On exam has hard stool in the rectum. MD made aware.Fleets enema ordered and given

## 2020-01-28 MED ORDER — MAGNESIUM CITRATE PO SOLN
1.0000 | Freq: Once | ORAL | Status: AC
  Administered 2020-01-28: 1 via ORAL
  Filled 2020-01-28: qty 296

## 2020-01-28 NOTE — Progress Notes (Signed)
Pt has been trying, but unsuccessfully having BM, only smear after fleet enema given per RN day shift reported. Miralax and Senokot given per schedule.  Pt stated he has pain in his rectal and disagree for any more enema tonight. We will try again at am.  SOB with exertions to bedside commode. HR up to 130-140 when ambulated. At rest with 4 LPM, SPO2 96-100%, RR 13-20, HR 90s-120, BP within normal limits. Remained afebrile.  We detected patent DP and PT pulses via doppler on left leg, but weaker on right leg. Left groin with skin glue incision clean and dry. Pain tolerated well after Oxycodone and Tylenol given. We continue to monitor.  Kennyth Lose, RN

## 2020-01-28 NOTE — Care Management Important Message (Signed)
Important Message  Patient Details  Name: JYAIR KIRALY MRN: [id number] Date of Birth: 11/20/1945   Medicare Important Message Given:  Yes     Shelda Altes 01/28/2020, 9:15 AM

## 2020-01-28 NOTE — Progress Notes (Addendum)
  Progress Note    01/28/2020 7:28 AM 4 Days Post-Op  Subjective:  Says he did not have a BM with 2 enemas.     Afebrile HR 100's-130's  409'W-119'J systolic 47% [id number]  Vitals:   01/28/20 0315 01/28/20 0355  BP:  110/66  Pulse: 101 101  Resp: 19 19  Temp:  97.7 F (36.5 C)  SpO2: 100% 99%    Physical Exam: Cardiac:  tachy Lungs:  Coarse BS throughout right lung Incisions:  Left groin incision is clean and dry  Extremities:  Brisk left DP doppler signal Abdomen:  Soft, NT/ND  CBC    Component Value Date/Time   WBC 11.4 (H) 01/27/2020 0346   RBC 3.46 (L) 01/27/2020 0346   HGB 9.4 (L) 01/27/2020 0346   HCT 30.4 (L) 01/27/2020 0346   PLT 157 01/27/2020 0346   MCV 87.9 01/27/2020 0346   MCH 27.2 01/27/2020 0346   MCHC 30.9 01/27/2020 0346   RDW 17.0 (H) 01/27/2020 0346   LYMPHSABS 1.2 11/29/2019 0926   MONOABS 1.2 (H) 11/29/2019 0926   EOSABS 0.2 11/29/2019 0926   BASOSABS 0.1 11/29/2019 0926    BMET    Component Value Date/Time   NA 136 01/27/2020 0346   K 4.3 01/27/2020 0346   CL 97 (L) 01/27/2020 0346   CO2 30 01/27/2020 0346   GLUCOSE 132 (H) 01/27/2020 0346   BUN 9 01/27/2020 0346   CREATININE 1.07 01/27/2020 0346   CALCIUM 8.0 (L) 01/27/2020 0346   GFRNONAA >60 01/27/2020 0346   GFRAA >60 01/27/2020 0346    INR    Component Value Date/Time   INR 1.0 01/24/2020 0525     Intake/Output Summary (Last 24 hours) at 01/28/2020 0728 Last data filed at 01/28/2020 0500 Gross per 24 hour  Intake 500 ml  Output 150 ml  Net 350 ml     Assessment:  74 y.o. male is s/p:  L CIA/EIA stent and L CFA  4 Days Post-Op  Plan: -pt with brisk doppler signal left DP.   -pt repeat spututm cx has abundant GNR and some GPC on gram stain.  He has coarse BS throughout the right lung.  Most likely will start Cipro but will defer to them given his allergy -still unable to have BM after 2 enemas and manual disimpaction by RN.  Will give mag citrate this am.    -DVT prophylaxis:  Sq hepain   Leontine Locket, PA-C [facility] [phone] 01/28/2020 7:28 AM  Ok for d/c from vascular standpoint Will try mag citrate Possible d/c today if ok with pulmonary service  Ruta Hinds, MD [facility] [place] Office: [phone]  Constipation resolved. Says he wants to leave tomorrow. D/c planning  Ruta Hinds, MD [facility] [place] Office: [phone]

## 2020-01-28 NOTE — Telephone Encounter (Signed)
Guys,  The family should ask primary attending in inpatient to call pulmonary service inpatient. In fact Noe Gens our app has already seen the patient. Please advise them  Thanks    SIGNATURE    Dr. Brand Males, M.D., F.C.C.P,  Pulmonary and Critical Care Medicine Staff Physician, [place] Director - Interstitial Lung Disease  Program  Pulmonary [place] [facility] [place], [place], [address]  Pager: [phone], If no answer  OR between  19:00-7:00h: page [phone] Telephone (clinical office): [phone] Telephone (research): [phone]  9:33 AM 01/28/2020

## 2020-01-28 NOTE — Progress Notes (Signed)
Patient took his Aero medication at this time, HR was 100, Sat 100%, Rate was 16, BS clear.  This medication was not in the [facility].

## 2020-01-29 NOTE — Progress Notes (Signed)
[facility] [place]  Subjective  -feels weak and tired.  Did have multiple bowel movements given previous complaint of constipation.   Objective (!) 150/74 96 98.3 F (36.8 C) (Oral) 12 96%  Intake/Output Summary (Last 24 hours) at 01/29/2020 1030 Last data filed at 01/29/2020 0355 Gross per 24 hour  Intake 360 ml  Output --  Net 360 ml    Left groin is clean dry and intact. Left dorsalis pedis and posterior tibial brisk by Doppler. Stable tissue loss to the left great toe and fifth toe.  Laboratory Lab Results: Recent Labs    01/27/20 0346  WBC 11.4*  HGB 9.4*  HCT 30.4*  PLT 157   BMET Recent Labs    01/27/20 0346  NA 136  K 4.3  CL 97*  CO2 30  GLUCOSE 132*  BUN 9  CREATININE 1.07  CALCIUM 8.0*    COAG Lab Results  Component Value Date   INR 1.0 01/24/2020   INR 1.0 04/19/2019   INR 1.7 (H) 04/15/2019   No results found for: PTT  Assessment/Planning: POD #5 status post left femoral endarterectomy with left left iliac stenting.  He has good Doppler signals in the left foot.  His pulmonary status has been limiting his discharge.  He is down to 4 L O2 which is his home dose.  Appreciate pulmonology seeing him and no indication for further antibiotics given no fevers and expected his resp cultures likely suggest colonization from recovering recent Pseudomonas infection that was treated.  He looks very weak and discussed may not do well at home and discussed the possibility of SNF which they are going to discuss as a family.  Attempt OOB to chair today and see how he does.  Marty Heck 01/29/2020 10:30 AM --

## 2020-01-30 MED ORDER — RIVAROXABAN 20 MG PO TABS
20.0000 mg | ORAL_TABLET | Freq: Every day | ORAL | Status: DC
  Administered 2020-01-30: 20 mg via ORAL
  Filled 2020-01-30: qty 1

## 2020-01-30 NOTE — Discharge Instructions (Signed)
[facility]  Discharge instructions  Lower Extremity Bypass Surgery  Please refer to the following instruction for your post-procedure care. Your surgeon or physician assistant will discuss any changes with you.  Activity  You are encouraged to walk as much as you can. You can slowly return to normal activities during the month after your surgery. Avoid strenuous activity and heavy lifting until your doctor tells you it's OK. Avoid activities such as vacuuming or swinging a golf club. Do not drive until your doctor give the OK and you are no longer taking prescription pain medications. It is also normal to have difficulty with sleep habits, eating and bowel movement after surgery. These will go away with time.  Bathing/Showering  Shower daily after you go home. Do not soak in a bathtub, hot tub, or swim until the incision heals completely.  Incision Care  Clean your incision with mild soap and water. Shower every day. Pat the area dry with a clean towel. You do not need a bandage unless otherwise instructed. Do not apply any ointments or creams to your incision. If you have open wounds you will be instructed how to care for them or a visiting nurse may be arranged for you. If you have staples or sutures along your incision they will be removed at your post-op appointment. You may have skin glue on your incision. Do not peel it off. It will come off on its own in about one week.  Wash the groin wound with soap and water daily and pat dry. (No tub bath-only shower)  Then put a dry gauze or washcloth in the groin to keep this area dry to help prevent wound infection.  Do this daily and as needed.  Do not use Vaseline or neosporin on your incisions.  Only use soap and water on your incisions and then protect and keep dry.  Diet  Resume your normal diet. There are no special food restrictions following this procedure. A low fat/ low cholesterol diet is  recommended for all patients with vascular disease. In order to heal from your surgery, it is CRITICAL to get adequate nutrition. Your body requires vitamins, minerals, and protein. Vegetables are the best source of vitamins and minerals. Vegetables also provide the perfect balance of protein. Processed food has little nutritional value, so try to avoid this.  Medications  Resume taking all your medications unless your doctor or physician assistant tells you not to. If your incision is causing pain, you may take over-the-counter pain relievers such as acetaminophen (Tylenol). If you were prescribed a stronger pain medication, please aware these medication can cause nausea and constipation. Prevent nausea by taking the medication with a snack or meal. Avoid constipation by drinking plenty of fluids and eating foods with high amount of fiber, such as fruits, vegetables, and grains. Take Colace 100 mg (an over-the-counter stool softener) twice a day as needed for constipation.  Do not take Tylenol if you are taking prescription pain medications.  Follow Up  Our office will schedule a follow up appointment 2-3 weeks following discharge.  Please call us immediately for any of the following conditions  .Severe or worsening pain in your legs or feet while at rest or while walking .Increase pain, redness, warmth, or drainage (pus) from your incision site(s) . Fever of 101 degree or higher . The swelling in your leg with the bypass suddenly worsens and becomes more painful than when you were in the hospital .  If you have been instructed to feel your graft pulse then you should do so every day. If you can no longer feel this pulse, call the office immediately. Not all patients are given this instruction. .  Leg swelling is common after leg bypass surgery.  The swelling should improve over a few months following surgery. To improve the swelling, you may elevate your legs above the level of your heart while  you are sitting or resting. Your surgeon or physician assistant may ask you to apply an ACE wrap or wear compression (TED) stockings to help to reduce swelling.  Reduce your risk of vascular disease  Stop smoking. If you would like help call [facility] at 1-800-QUIT-NOW 3063971211) or [place] at 320-657-5617.  . Manage your cholesterol . Maintain a desired weight . Control your diabetes weight . Control your diabetes . Keep your blood pressure down .  If you have any questions, please call the office at [phone]   Information on my medicine - XARELTO (rivaroxaban)  This medication education was reviewed with me or my healthcare representative as part of my discharge preparation.   WHY WAS XARELTO PRESCRIBED FOR YOU? Xarelto was prescribed to treat blood clots that may have been found in the veins of your legs (deep vein thrombosis) or in your lungs (pulmonary embolism) and to reduce the risk of them occurring again.  What do you need to know about Xarelto? The dose is one 20 mg tablet taken ONCE A DAY with your evening meal.  DO NOT stop taking Xarelto without talking to the health care provider who prescribed the medication.  Refill your prescription for 20 mg tablets before you run out.  After discharge, you should have regular check-up appointments with your healthcare provider that is prescribing your Xarelto.  In the future your dose may need to be changed if your kidney function changes by a significant amount.  What do you do if you miss a dose? If you are taking Xarelto ONCE DAILY and you miss a dose, take it as soon as you remember on the same day then continue your regularly scheduled once daily regimen the next day. Do not take two doses of Xarelto at the same time.   Important Safety Information Xarelto is a blood thinner medicine that can cause bleeding. You should call your healthcare provider right away if you experience any of the  following: ? Bleeding from an injury or your nose that does not stop. ? Unusual colored urine (red or dark brown) or unusual colored stools (red or black). ? Unusual bruising for unknown reasons. ? A serious fall or if you hit your head (even if there is no bleeding).  Some medicines may interact with Xarelto and might increase your risk of bleeding while on Xarelto. To help avoid this, consult your healthcare provider or pharmacist prior to using any new prescription or non-prescription medications, including herbals, vitamins, non-steroidal anti-inflammatory drugs (NSAIDs) and supplements.  This website has more information on Xarelto: [email or web address]-benson.[email or web address]

## 2020-01-30 NOTE — Progress Notes (Signed)
[facility] [place] in chair this am.  Strength a bit better.     Objective (!) 143/71 88 97.9 F (36.6 C) (Oral) 21 98%  Intake/Output Summary (Last 24 hours) at 01/30/2020 1006 Last data filed at 01/29/2020 1845 Gross per 24 hour  Intake 240 ml  Output --  Net 240 ml    Left groin is clean dry and intact. Left dorsalis pedis and posterior tibial brisk by Doppler. Stable tissue loss to the left great toe and fifth toe.  Laboratory Lab Results: No results for input(s): WBC, HGB, HCT, PLT in the last 72 hours. BMET No results for input(s): NA, K, CL, CO2, GLUCOSE, BUN, CREATININE, CALCIUM in the last 72 hours.  COAG Lab Results  Component Value Date   INR 1.0 01/24/2020   INR 1.0 04/19/2019   INR 1.7 (H) 04/15/2019   No results found for: PTT  Assessment/Planning: POD #6 status post left femoral endarterectomy with left left iliac stenting.  He has good Doppler signals in the left foot.  His pulmonary status has been limiting his discharge.  Appreciate pulmonology seeing him and no indication for further antibiotics given no fevers and expected his resp cultures likely suggest colonization from recovering recent pneumonia that was treated.    He looked fairly weak yesterday.  Today he looks much better and is sitting up in a chair.  Remains on 4 L O2 which is his home dose.  Wife is optimistic they can try and go home tomorrow if today is as good as yesterday.  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  Marty Heck 01/30/2020 10:06 AM --

## 2020-01-31 LAB — CBC
HCT: 30.8 % — ABNORMAL LOW (ref 39.0–52.0)
Hemoglobin: 9.5 g/dL — ABNORMAL LOW (ref 13.0–17.0)
MCH: 27.3 pg (ref 26.0–34.0)
MCHC: 30.8 g/dL (ref 30.0–36.0)
MCV: 88.5 fL (ref 80.0–100.0)
Platelets: 235 10*3/uL (ref 150–400)
RBC: 3.48 MIL/uL — ABNORMAL LOW (ref 4.22–5.81)
RDW: 17.2 % — ABNORMAL HIGH (ref 11.5–15.5)
WBC: 6.8 10*3/uL (ref 4.0–10.5)
nRBC: 0 % (ref 0.0–0.2)

## 2020-01-31 LAB — CULTURE, RESPIRATORY W GRAM STAIN: Gram Stain: NONE SEEN

## 2020-01-31 MED ORDER — TRAMADOL HCL 50 MG PO TABS: 50.0000 mg | ORAL_TABLET | Freq: Two times a day (BID) | ORAL | 1 refills | Status: DC | PRN

## 2020-01-31 MED ORDER — ASPIRIN 81 MG PO TBEC: 81.0000 mg | DELAYED_RELEASE_TABLET | Freq: Every day | ORAL | 11 refills | Status: DC

## 2020-01-31 NOTE — Plan of Care (Signed)
DISCHARGE NOTE HOME Francisco [name] to be discharged home per MD order. Discussed prescriptions and follow up appointments with the patient. Prescriptions given to patient; medication list explained in detail. Patient verbalized understanding.  Skin clean, dry and intact without evidence of skin break down, no evidence of skin tears noted. IV catheter discontinued intact. Site without signs and symptoms of complications. Dressing and pressure applied. Pt denies pain at the site currently. No complaints noted.  Patient free of lines, drains, and wounds.   An After Visit Summary (AVS) was printed and given to the patient. Patient escorted via wheelchair, and discharged home via private auto.  Stephan Minister, RN

## 2020-01-31 NOTE — Progress Notes (Signed)
OT Cancellation Note  Patient Details Name: Francisco [name] MRN: [id number] DOB: [birth year]   Cancelled Treatment:    Reason Eval/Treat Not Completed: Other (comment) (Patient dressed and seated in recliner in prep for d/c. )   Francisco [name] H. OTR/L Supplemental OT, Department of rehab services [phone]  Francisco [name] R H. 01/31/2020, 11:12 AM

## 2020-01-31 NOTE — Progress Notes (Signed)
  Progress Note    01/31/2020 7:33 AM 7 Days Post-Op  Subjective:  Ready for discharge home   Vitals:   01/31/20 0033 01/31/20 0535  BP: (!) 162/75 (!) 142/80  Pulse: 92 92  Resp: 20 17  Temp: 98.9 F (37.2 C) 98.4 F (36.9 C)  SpO2: 98% 100%   Physical Exam: Lungs:  Non labored with 4L 02 by [place] Incisions:  L groin incision c/d/i Extremities:  Palpable L DP Abdomen:  soft Neurologic: A&O  CBC    Component Value Date/Time   WBC 6.8 01/31/2020 0209   RBC 3.48 (L) 01/31/2020 0209   HGB 9.5 (L) 01/31/2020 0209   HCT 30.8 (L) 01/31/2020 0209   PLT 235 01/31/2020 0209   MCV 88.5 01/31/2020 0209   MCH 27.3 01/31/2020 0209   MCHC 30.8 01/31/2020 0209   RDW 17.2 (H) 01/31/2020 0209   LYMPHSABS 1.2 11/29/2019 0926   MONOABS 1.2 (H) 11/29/2019 0926   EOSABS 0.2 11/29/2019 0926   BASOSABS 0.1 11/29/2019 0926    BMET    Component Value Date/Time   NA 136 01/27/2020 0346   K 4.3 01/27/2020 0346   CL 97 (L) 01/27/2020 0346   CO2 30 01/27/2020 0346   GLUCOSE 132 (H) 01/27/2020 0346   BUN 9 01/27/2020 0346   CREATININE 1.07 01/27/2020 0346   CALCIUM 8.0 (L) 01/27/2020 0346   GFRNONAA >60 01/27/2020 0346   GFRAA >60 01/27/2020 0346    INR    Component Value Date/Time   INR 1.0 01/24/2020 0525     Intake/Output Summary (Last 24 hours) at 01/31/2020 0733 Last data filed at 01/31/2020 0500 Gross per 24 hour  Intake 960 ml  Output --  Net 960 ml     Assessment/Plan:  74 y.o. male is s/p L iliac stent and L CFA endarterectomy 7 Days Post-Op   L foot well perfused with palpable L DP Ok for discharge home [facility] team consulted to arrange [facility] PT Follow up 02/16/20 with [name] Patient will be discharged on ASA and Xarelto   Dagoberto Ligas, PA-C [facility] [phone] 01/31/2020 7:33 AM

## 2020-01-31 NOTE — Care Management Important Message (Signed)
Important Message  Patient Details  Name: Francisco [name] MRN: [id number] Date of Birth: [birth year]   Medicare Important Message Given:  Yes     Shelda Altes 01/31/2020, 10:00 AM

## 2020-01-31 NOTE — TOC Transition Note (Signed)
Transition of Care (TOC) - CM/SW Discharge Note Marvetta Gibbons RN, BSN Transitions of Care Unit 4E- RN Case Manager See Treatment Team for direct phone #    Patient Details  Name: Francisco [name] MRN: [id number] Date of Birth: 1946/07/03  Transition of Care [facility]) CM/SW Contact:  Dawayne Patricia, RN Phone Number: 01/31/2020, 10:35 AM   Clinical Narrative:    Pt stable for transition home today, orders placed for HHPT. CM spoke with pt at bedside for transition needs. Wife present at bedside. Discussed transition needs for HHPT- pt agreeable to services. List provided for [facility] choice Per CMS guidelines from medicare.gov website with star ratings (copy placed in shadow chart)- per pt and wife they have used [facility] in the past and would like to use again, they have [employer] for home 02 needs, portable tank in the car for transport home. Per discussion pt has DME at home including RW, rollator, and BSC.  Call made to [facility] with [facility] for [facility] referral- referral for HHPT has been accepted.    Final next level of care: [place] Barriers to Discharge: No Barriers Identified   Patient Goals and CMS Choice Patient states their goals for this hospitalization and ongoing recovery are:: "to get back up and around and get stronger" CMS Medicare.gov Compare Post Acute Care list provided to:: Patient Choice offered to / list presented to : Patient, Spouse  Discharge Placement                 Home with [facility]      Discharge Plan and Services   Discharge Planning Services: CM Consult Post Acute Care Choice: Home Health          DME Arranged: N/A DME Agency: NA       HH Arranged: PT [place] Agency: [place] Date HH Agency Contacted: 01/31/20 Time [place]: 1034 Representative spoke with at [place]: [place] Interventions     Readmission Risk Interventions Readmission Risk Prevention Plan 01/31/2020   Transportation Screening Complete  PCP or Specialist Appt within 5-7 Days Complete  Home Care Screening Complete  Medication Review (RN CM) Complete  Some recent data might be hidden

## 2020-02-01 NOTE — Discharge Summary (Signed)
Bypass Discharge Summary Patient ID: Francisco [name] [id number] 74 y.o. 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 is a 74 year old male who was brought in as an outpatient due to critical limb ischemia of left lower extremity with rest pain and underwent left iliac stenting and common femoral artery endarterectomy by [name] on 01/21/2020 under spinal anesthesia.  He tolerated the procedure well and was admitted to the hospital postoperatively.  Patient had a prolonged hospital stay due to respiratory status postoperatively given his stage IV COPD.  Critical care/pulmonology was consulted during hospital stay to help with respiratory status.  At the time of discharge patient had a palpable left DP pulse.  Left great toe and fifth toe also were demarcating however there is no indication for amputation at this time.  Based on therapy recommendations during hospital stay the transition of care team was consulted to arrange home health physical therapy.  Patient was discharged back on his Xarelto patient to added aspirin 81 mg daily.  He was also prescribed narcotic pain medication for continued postoperative pain control.  He will follow-up with [name] in 2 to 3 weeks.  Discharge instructions were reviewed with the patient and his wife and they voiced understanding.  He was discharged home with home health in stable condition.  Consults: pulmonology  Treatments: surgery: Left iliac stents and common femoral artery endarterectomy by [name] on 01/21/2020    Disposition: Discharge disposition: 01-Home  or Self Care       - For [facility] Registry use ---  Post-op:  Wound infection: No  Graft infection: No  Transfusion: No  New Arrhythmia: No Patency judged by: [ ]  Dopper only, [ ]  Palpable graft pulse, [x ] Palpable distal pulse, [ ]  ABI inc. > 0.15, [ ]  Duplex D/C Ambulatory Status: Ambulatory with Assistance  Complications: MI: [x ] No, [ ]  Troponin only, [ ]  EKG or Clinical CHF: No Resp failure: [x ] none, [ ]  Pneumonia, [ ]  Ventilator Chg in renal function: [ x] none, [ ]  Inc. Cr > 0.5, [ ]  Temp. Dialysis, [ ]  Permanent dialysis Stroke: [x ] None, [ ]  Minor, [ ]  Major Return to OR: No  Reason for return to OR: [ ]  Bleeding, [ ]  Infection, [ ]  Thrombosis, [ ]  Revision  Discharge medications: Statin use:  Yes ASA use:  Yes Plavix use:  No  for medical reason not indicated Beta blocker use: No  for medical reason not indicated Coumadin use: Yes, Xarelto    Patient Instructions:  Allergies as of 01/31/2020      Reactions   Tape Other (See Comments)   SKIN IS VERY THIN, TEARS SKIN; CAN ONLY USE COBAN WRAPS DUE TO CONDITION OF SKIN!!   Ciprofloxacin Nausea Only   Sick on stomach, weak/tired   Levaquin [levofloxacin]    hallucinations      Medication List    STOP taking these medications   budesonide-formoterol 160-4.5 MCG/ACT inhaler Commonly known as: Symbicort     TAKE these medications   acetaminophen 500 MG tablet Commonly known as: TYLENOL Take 1,000 mg by mouth every 6 (six) hours as  needed for moderate pain.   albuterol 108 (90 Base) MCG/ACT inhaler Commonly known as: Ventolin HFA USE 2 PUFFS EVERY 6 HOURS  AS NEEDED FOR WHEEZING What changed:   how much to take  how to take this  when to take this  reasons to take this  additional instructions   aspirin 81 MG EC tablet Take 1 tablet (81 mg total) by mouth daily. Swallow whole.   azithromycin 250 MG tablet Commonly known as: ZITHROMAX Take 1 tablet (250 mg total) by mouth every Monday,  Wednesday, and Friday.   Breztri Aerosphere 160-9-4.8 MCG/ACT Aero Generic drug: Budeson-Glycopyrrol-Formoterol Inhale 2 puffs into the lungs 2 (two) times daily.   Breztri Aerosphere 160-9-4.8 MCG/ACT Aero Generic drug: Budeson-Glycopyrrol-Formoterol Inhale 2 puffs into the lungs in the morning and at bedtime. Rinse mouth after each use.   CALCIUM 600+D PO Take 2 tablets by mouth daily.   cycloSPORINE 0.05 % ophthalmic emulsion Commonly known as: RESTASIS Place 1 drop into both eyes 2 (two) times daily as needed (dry eyes).   Daliresp 500 MCG Tabs tablet Generic drug: roflumilast Take 1 tablet (500 mcg total) by mouth daily.   fluticasone 50 MCG/ACT nasal spray Commonly known as: FLONASE Place 2 sprays into both nostrils daily.   Flutter Devi 10 times Twice a day and prn as needed, may increase if feeling worse   furosemide 20 MG tablet Commonly known as: LASIX TAKE 1 TABLET BY MOUTH  DAILY What changed:   how much to take  how to take this  when to take this  additional instructions   OXYGEN Inhale 3 L into the lungs continuous.   predniSONE 10 MG tablet Commonly known as: DELTASONE Take 1 tablet (10 mg total) by mouth daily with breakfast.   predniSONE 10 MG tablet Commonly known as: DELTASONE Take 3 tablets (30 mg total) by mouth daily with breakfast for 10 days, THEN 2 tablets (20 mg total) daily with breakfast for 10 days, THEN 1 tablet (10 mg total) daily with breakfast for 10 days. Take 2 tablets (20mg  total) daily for the next 5 days. Take in the AM with food.. Start taking on: January 05, 2020   rivaroxaban 20 MG Tabs tablet Commonly known as: Xarelto TAKE 1 TABLET BY MOUTH  DAILY What changed:   how much to take  how to take this  when to take this  additional instructions   sodium chloride HYPERTONIC 3 % nebulizer solution Take by nebulization daily.   Spiriva Respimat 2.5 MCG/ACT Aers Generic drug: Tiotropium Bromide Monohydrate USE 2  INHALATIONS BY MOUTH  ONCE DAILY   traMADol 50 MG tablet Commonly known as: ULTRAM Take 1 tablet (50 mg total) by mouth every 12 (twelve) hours as needed for moderate pain or severe pain.      Activity: activity as tolerated Diet: regular diet Wound Care: keep wound clean and dry  Follow-up with [name] in 2 weeks.  SignedDagoberto Ligas 02/01/2020 9:22 AM

## 2020-02-02 ENCOUNTER — Telehealth: Payer: Self-pay

## 2020-02-09 LAB — AFB CULTURE WITH SMEAR (NOT AT ARMC)
Acid Fast Culture: NEGATIVE
Acid Fast Smear: NEGATIVE

## 2020-02-13 DIAGNOSIS — J449 Chronic obstructive pulmonary disease, unspecified: Secondary | ICD-10-CM

## 2020-02-14 ENCOUNTER — Telehealth: Payer: Self-pay | Admitting: *Deleted

## 2020-02-14 NOTE — Telephone Encounter (Signed)
Called and spoke with the pt. He states that his breathing has been worse ever since the last hospitalization approx 1 month ago. He gets winded walking across the room. He feels that he is not breathing in enough through his nose with nasal cannula and that he would benefit more from a facemask for o2. He has appt with MR on 02/21/20 but does not want to wait until then to discuss and MR off this wk so will forward to APP of the day. Thanks.

## 2020-02-14 NOTE — Telephone Encounter (Signed)
Would recommend the patient be prescribed a facemask for O2.  Okay to place this order.  Would also recommend the patient reconsider stance on establishing with hospice.  He is already established with palliative care.  This can be decided and discussed in further detail with [name] on 02/21/2020.  But if symptoms are have worsened to this degree.  I believe hospice could significantly benefit the patient.  If patient is agreeable I would recommend contacting contacting:  Margaretmary Eddy, RN telephone [phone] so she can coordinate close follow-up from hospice.  And okay to place order for hospice referral.  [name], FNP

## 2020-02-14 NOTE — Telephone Encounter (Signed)
Patient's wife called stating the Physical Therapist today recommended home health to be ordered as patient's stool is black and he is very weak and having a difficult time breathing. I advised the patient to report to the ED however his wife says patient does not want to go.  I stressed the importance since his symptoms were worsening.   I called [facility] to order a nurse assessment and spoke to [employer].  [facility] is trying to have a nurse to see patient tomorrow, 02/15/2020.  I notified patient's wife and again explained  the need for patient to go to the ED.  Patient's post op appointment with Dr Oneida Alar is Thursday, 02/17/20.

## 2020-02-15 ENCOUNTER — Telehealth: Payer: Self-pay | Admitting: Internal Medicine

## 2020-02-15 ENCOUNTER — Telehealth: Payer: Self-pay

## 2020-02-15 ENCOUNTER — Telehealth: Payer: Self-pay | Admitting: Family Medicine

## 2020-02-15 NOTE — Telephone Encounter (Signed)
Order was placed 8/9. PCCs, please advise.

## 2020-02-15 NOTE — Telephone Encounter (Signed)
Yes please-please do fecal occult blood testing to make sure no blood in stool

## 2020-02-15 NOTE — Telephone Encounter (Signed)
Michelle with [facility] called requesting orders for [facility] 1 time per wk x 7 wks for this patient.  Order was given.    She also wanted to know if we gave orders for stool samples since the patient reported black, tarry stools.  I advised her to contact the patients PCP so they can give a lab order and follow up with this.    She will reach out to the patient's PCP for labs and place the [facility] order from [place] for [facility] visits.  Thurston Hole., LPN

## 2020-02-15 NOTE — Telephone Encounter (Signed)
I called and spoke to the wife to let her know that we just got the confirmation back on the order this afternoon

## 2020-02-15 NOTE — Telephone Encounter (Signed)
Sharyn Lull from [facility] called stating pt has began home health. Pt has stated he is having dark, tar-colored stools. Sharyn Lull is wanting to ask if Dr. Yong Channel would like for them to collect a sample and send it to [facility]. Please advise.

## 2020-02-15 NOTE — Telephone Encounter (Signed)
Called and spoke with Sharyn Lull and gave her below message, she states they will do fecal occult testing the next time they go back out to see the pt.

## 2020-02-15 NOTE — Telephone Encounter (Signed)
See below

## 2020-02-16 ENCOUNTER — Other Ambulatory Visit: Payer: Self-pay

## 2020-02-16 ENCOUNTER — Telehealth: Payer: Self-pay | Admitting: Cardiology

## 2020-02-16 ENCOUNTER — Ambulatory Visit (INDEPENDENT_AMBULATORY_CARE_PROVIDER_SITE_OTHER)
Admit: 2020-02-16 | Discharge: 2020-02-16 | Disposition: A | Discharge status: Home / Self Care | Payer: Medicare Other | Attending: Internal Medicine | Admitting: Internal Medicine

## 2020-02-16 DIAGNOSIS — J151 Pneumonia due to Pseudomonas: Secondary | ICD-10-CM | POA: Diagnosis not present

## 2020-02-16 DIAGNOSIS — J449 Chronic obstructive pulmonary disease, unspecified: Secondary | ICD-10-CM | POA: Diagnosis not present

## 2020-02-16 IMAGING — CT CT CHEST W/O CM
2 of 4 series · 15 of 36 positions shown, 18 images · non-contrast
Comparison: Insert CT angio chest [DATE]

CLINICAL DATA: COPD with pneumonia.

EXAM:
CT CHEST WITHOUT CONTRAST
TECHNIQUE: Multidetector CT imaging of the chest was performed following the
standard protocol without IV contrast.

[Series 2: thorax · axial · 0.75mm/px · z∈[-306,-42]mm · 12 of 156 slices shown, 15 images]
[im 12/156  mediastinal]
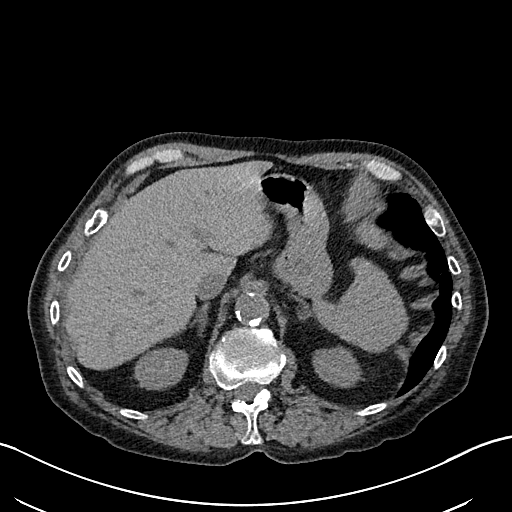
[im 12/156  lung]
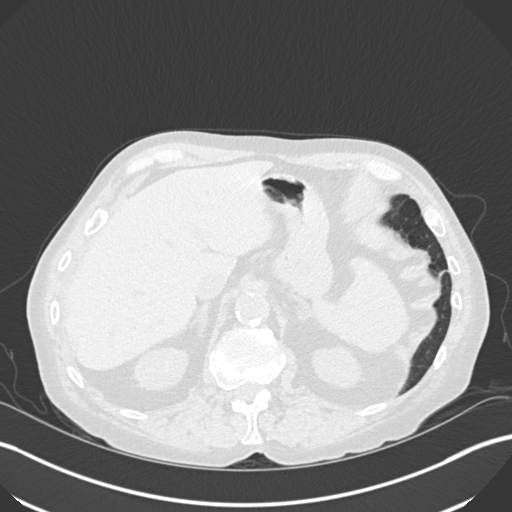
[im 24/156  lung]
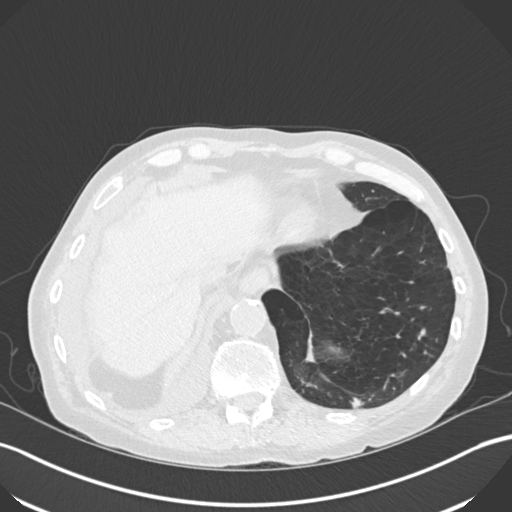
[im 36/156  lung]
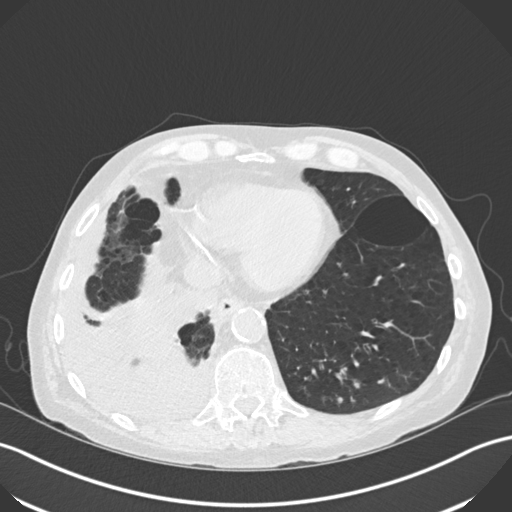
[im 48/156  lung]
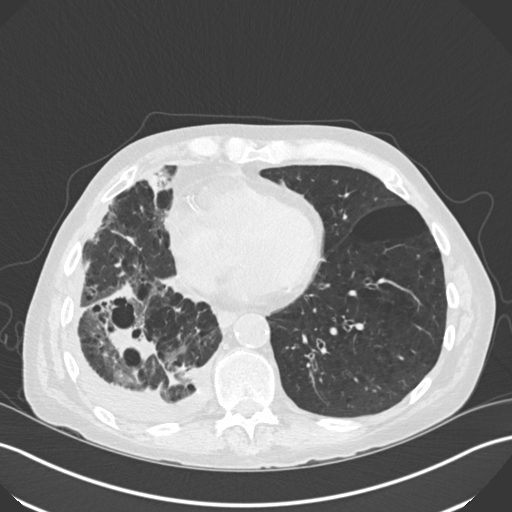
[im 60/156  mediastinal]
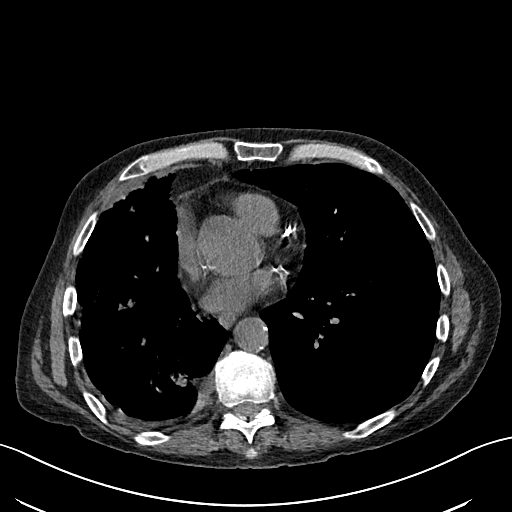
[im 60/156  lung]
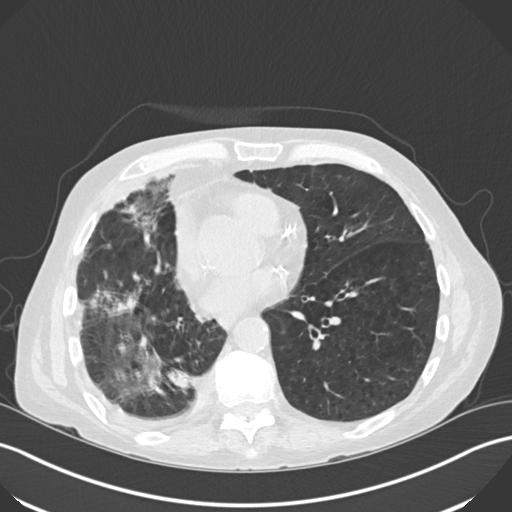
[im 72/156  lung]
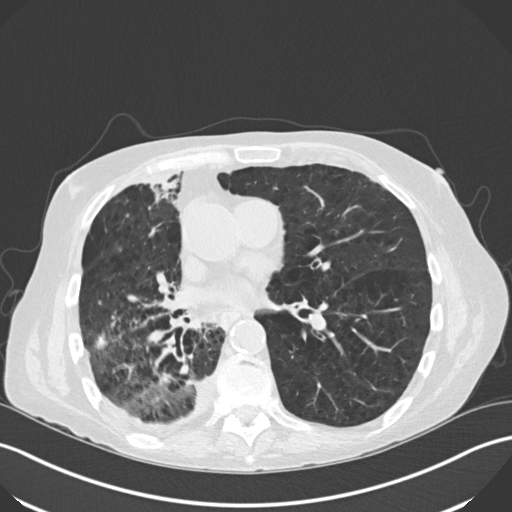
[im 84/156  lung]
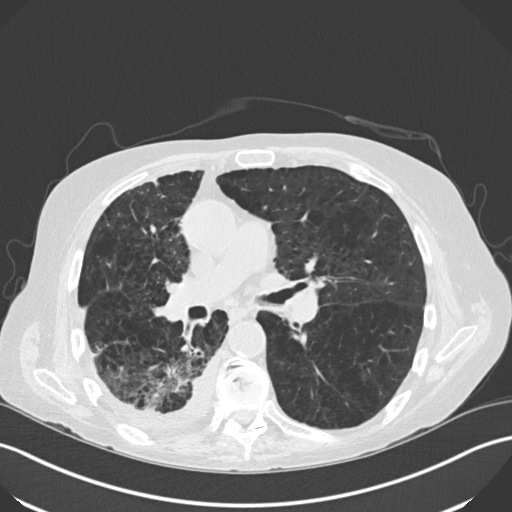
[im 96/156  lung]
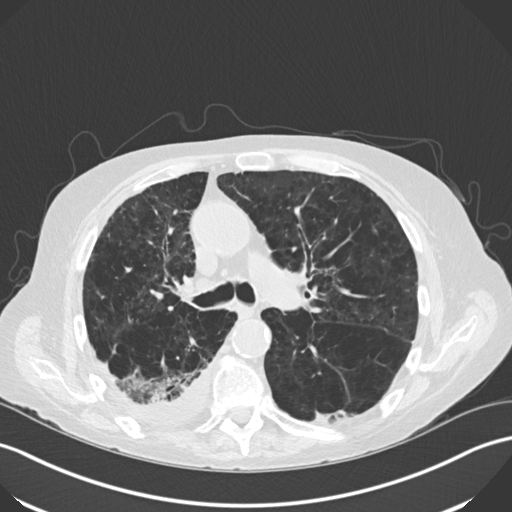
[im 108/156  mediastinal]
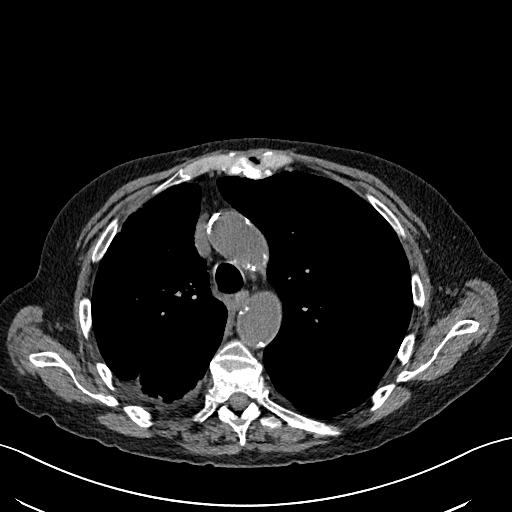
[im 108/156  lung]
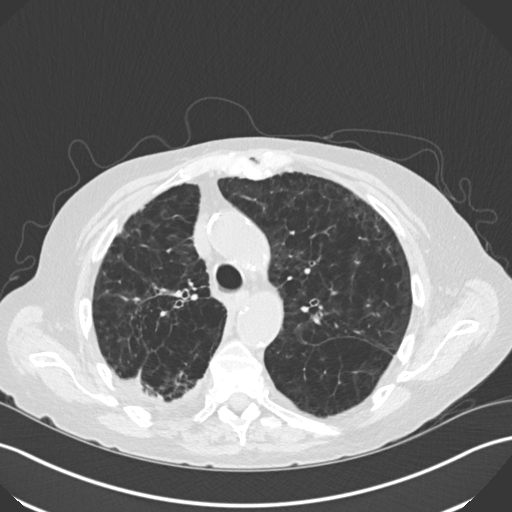
[im 120/156  lung]
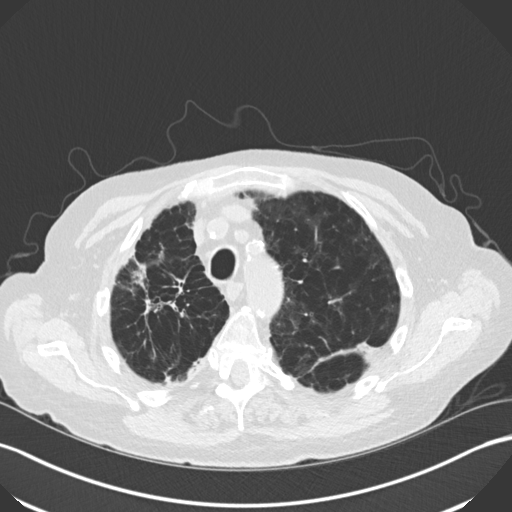
[im 132/156  lung]
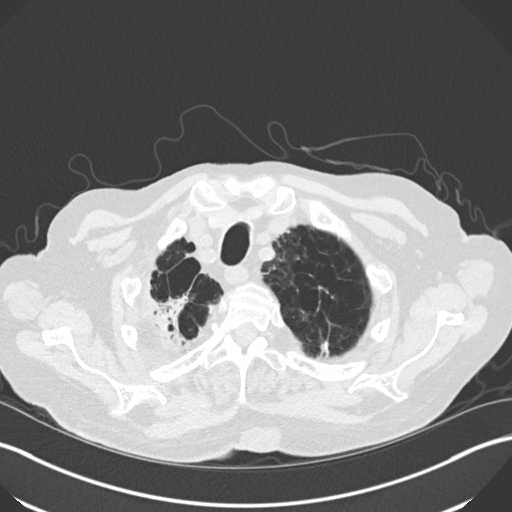
[im 144/156  lung]
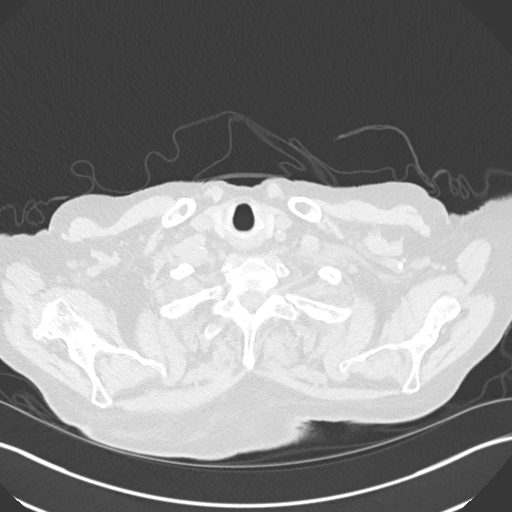

[Series 5: coronal · coronal · 0.61mm/px · 3 of 128 slices shown]
[im 26/128  lung]
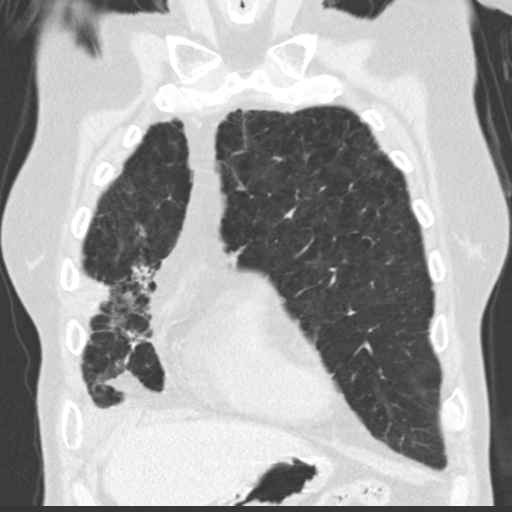
[im 51/128  lung]
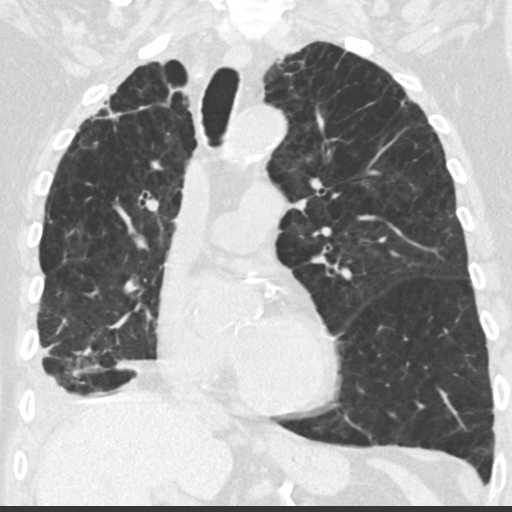
[im 77/128  lung]
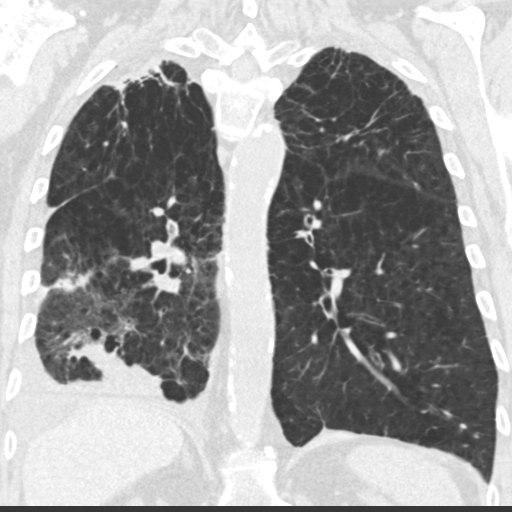

[15 of 36 positions shown; findings below may reference images not displayed]

FINDINGS: Cardiovascular: The heart size is normal. No substantial pericardial
effusion. Coronary artery calcification is evident. Atherosclerotic
calcification is noted in the wall of the thoracic aorta. Ascending
thoracic aorta measures 3.9 cm diameter.

Mediastinum/Nodes: No mediastinal lymphadenopathy. No evidence for
gross hilar lymphadenopathy although assessment is limited by the
lack of intravenous contrast on today's study. The esophagus has
normal imaging features. There is no axillary lymphadenopathy.

Lungs/Pleura: Centrilobular and paraseptal emphysema evident.
Architectural distortion consolidative opacity in the right apex is
probably related to scarring. Appearance is similar to older CT of
[DATE] although consolidative opacity seen on [DATE] today is
more confluent than on the study from 2 years ago. Bronchiectasis
with new patchy airspace consolidation noted in the inferior right
upper lobe anteriorly. There is some patchy airspace disease in the
posterior right middle lobe, new in the interval. Interstitial and
ground-glass opacity in the right lower lobe is associated with new
consolidative airspace disease towards the [REDACTED] small right pleural
effusion is new in the interval. 8 mm posterior left lower lobe
nodule on [DATE] [DATE] is new since prior. E.

Upper Abdomen: Unremarkable.

Musculoskeletal: Stable midthoracic compression fractures. No
worrisome lytic or sclerotic osseous abnormality.
IMPRESSION: 1. Interval development of patchy airspace consolidation in the
inferior right upper lobe with new patchy airspace disease in the
posterior right middle lobe and right lower lobe. Imaging features
are compatible with multifocal pneumonia. Multifocal neoplasm
(adenocarcinoma) considered less likely but close follow-up
recommended.
2. New small right pleural effusion.
3. 8 mm posterior left lower lobe pulmonary nodule, new since prior.
This may be infectious/inflammatory, but follow-up CT in 3 months
recommended to ensure resolution.
4. Architectural distortion consolidative opacity in the right apex
is similar to older CT of [DATE] although opacity inferiorly in
this region is more confluent than on the study from 2 years ago.
This is likely related to evolution of scarring, but continued
attention on follow-up recommended.
5. Aortic Atherosclerosis ([6K]-[6K]) and Emphysema ([6K]-[6K]).

## 2020-02-16 NOTE — Telephone Encounter (Signed)
Returned call to patient's wife.She stated husband had a chest ct this morning she wanted to make sure report went to [facility].After reviewing chart report was sent to [facility].

## 2020-02-16 NOTE — Telephone Encounter (Signed)
New Message  Patient is calling in to speak with [name] will be receiving the results of the CT since he is the ordering Doctor. Please confirm.

## 2020-02-17 ENCOUNTER — Encounter: Payer: Self-pay | Admitting: Vascular Surgery

## 2020-02-17 ENCOUNTER — Ambulatory Visit (INDEPENDENT_AMBULATORY_CARE_PROVIDER_SITE_OTHER): Payer: Self-pay | Admitting: Vascular Surgery

## 2020-02-17 ENCOUNTER — Telehealth: Payer: Self-pay

## 2020-02-17 VITALS — BP 112/71 | HR 120 | Temp 98.1°F | Resp 20 | Ht 72.0 in | Wt 160.0 lb

## 2020-02-17 DIAGNOSIS — I739 Peripheral vascular disease, unspecified: Secondary | ICD-10-CM

## 2020-02-17 NOTE — Telephone Encounter (Signed)
Per [name], I called and left a voice message for Sharyn Lull with [facility] to draw CBC labs on this patient, seen today 02/17/20. Left message to return my phone call.  Thurston Hole., LPN

## 2020-02-17 NOTE — Progress Notes (Addendum)
Patient is a 74 year old male who returns for follow-up today. He recently underwent left femoral endarterectomy for rest pain and early tissue loss in the left foot July 2021. He had previously undergone right common femoral endarterectomy October 2020. His hospital stay was complicated by decline in his overall pulmonary status. He has known severe COPD and has had several episodes of pneumonia in the recent past. He states that his breathing has declined significantly since he was in the hospital. He currently has some lower extremity edema. He has had some of this baseline. He states that his left foot feels better although he still has some pain. He has developed some gangrenous changes in the first and fifth toe that were starting while he was in the hospital. He has no incisional drainage. 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 He is also on aspirin. He states that recently he has been having tarry stools. He is curious on whether or not he may be losing some blood and this is attributing to his pulmonary dysfunction. He has follow-up scheduled in the near future with his primary care physician as well as his pulmonary doctor.  Past Surgical History:  Procedure Laterality Date  . ABDOMINAL AORTOGRAM W/LOWER EXTREMITY N/A 01/21/2020   Procedure: ABDOMINAL AORTOGRAM W/LOWER EXTREMITY;  Surgeon: Elam Dutch, MD;  Location: [place] [facility];  Service: Cardiovascular;  Laterality: N/A;  . APPENDECTOMY    . CATARACT EXTRACTION, BILATERAL    . ENDARTERECTOMY FEMORAL Right 04/19/2019   Procedure: ENDARTERECTOMY RIGHT COMMON FEMORAL;  Surgeon: Elam Dutch, MD;  Location: [place];  Service: Vascular;  Laterality: Right;  . ENDARTERECTOMY FEMORAL Left 01/24/2020   Procedure: LEFT FEMORAL ENDARTERECTOMY WITH DACRON PATCH ANGIOPLASTY;  Surgeon: Elam Dutch, MD;  Location: [place];  Service: Vascular;  Laterality: Left;  . FEMORAL ENDARTERECTOMY Left 01/24/2020  .  INSERTION OF ILIAC STENT Left 01/24/2020   Procedure: INSERTION OF VBX STENT 8X59 AND 8X39 LEFT COMMON ILIAC ARTERY. INSERTION OF INNOVA 7 X 60 INNOVA STENT LEFT EXTERNAL ILIAC ARTERY. ;  Surgeon: Elam Dutch, MD;  Location: [place];  Service: Vascular;  Laterality: Left;  . LOWER EXTREMITY ANGIOGRAPHY  12/11/2018  . LOWER EXTREMITY ANGIOGRAPHY N/A 12/11/2018   Procedure: LOWER EXTREMITY ANGIOGRAPHY;  Surgeon: Elam Dutch, MD;  Location: [place] [facility];  Service: Cardiovascular;  Laterality: N/A;  . PATCH ANGIOPLASTY Right 04/19/2019   Procedure: Patch Angioplasty;  Surgeon: Elam Dutch, MD;  Location: [place];  Service: Vascular;  Laterality: Right;  . PERIPHERAL VASCULAR INTERVENTION Right 12/11/2018   Procedure: PERIPHERAL VASCULAR INTERVENTION;  Surgeon: Elam Dutch, MD;  Location: [place] [facility];  Service: Cardiovascular;  Laterality: Right;  Common Iliac   . SKIN CANCER EXCISION     "lips, face, ears, arms" (04/07/2018)  . TONSILLECTOMY    . ULTRASOUND GUIDANCE FOR VASCULAR ACCESS Right 01/24/2020   Procedure: ULTRASOUND GUIDANCE FOR VASCULAR ACCESS;  Surgeon: Elam Dutch, MD;  Location: [facility];  Service: Vascular;  Laterality: Right;     Physical exam:  Vitals:   02/17/20 1058  BP: 112/71  Pulse: (!) 120  Resp: 20  Temp: 98.1 F (36.7 C)  SpO2: 100%  Weight: 160 lb (72.6 kg)  Height: 6' (1.829 m)    Extremities: Well-healed left groin incision dry gangrenous changes tip of toe one and five left foot 2+ edema left leg  Right leg 1+ edema well-healed right groin incision right foot  is pink and warm no ulcers  Assessment: Slowly improving from his left femoral endarterectomy with improved perfusion to the left foot. Hopefully the tips of each toes will continue to heal currently they seem to be drying up. He is certainly not a very good candidate for any other operations at this point with his declining pulmonary function. His left leg edema is most  likely secondary to disruption of lymphatics from his operation and left leg dependency. However, he certainly would be at risk for DVT but since he is already on chronic Xarelto therapy I do not believe he would benefit from an ultrasound at this point unless we need to stop his Xarelto. He has some concerns on whether or not he may be anemic.  Plan: We will schedule the patient to have a CBC through his home health nurse and also forward this to his primary care physician Dr. Garret Reddish.  The patient will follow up with me in a few weeks to recheck his left foot.  Ruta Hinds, MD [facility] Office: [phone]   Pt hemoglobin on 8/13 is 5.1.  He was sent to the [facility] ER for transfusion and work up for possible GI bleed.  Ruta Hinds, MD [facility] Office: [phone]

## 2020-02-18 ENCOUNTER — Encounter (HOSPITAL_COMMUNITY): Payer: Self-pay

## 2020-02-18 ENCOUNTER — Telehealth: Payer: Self-pay | Admitting: Family Medicine

## 2020-02-18 ENCOUNTER — Inpatient Hospital Stay (HOSPITAL_COMMUNITY)
Admission: EM | Admit: 2020-02-18 | Discharge: 2020-02-25 | DRG: 378 | Disposition: A | Discharge status: Home Health | Payer: Medicare Other | Attending: Internal Medicine | Admitting: Internal Medicine

## 2020-02-18 ENCOUNTER — Emergency Department (HOSPITAL_COMMUNITY): Payer: Medicare Other

## 2020-02-18 ENCOUNTER — Telehealth: Payer: Self-pay

## 2020-02-18 DIAGNOSIS — Z86010 Personal history of colonic polyps: Secondary | ICD-10-CM

## 2020-02-18 DIAGNOSIS — I739 Peripheral vascular disease, unspecified: Secondary | ICD-10-CM | POA: Diagnosis present

## 2020-02-18 DIAGNOSIS — Z823 Family history of stroke: Secondary | ICD-10-CM | POA: Diagnosis not present

## 2020-02-18 DIAGNOSIS — E872 Acidosis, unspecified: Secondary | ICD-10-CM | POA: Diagnosis present

## 2020-02-18 DIAGNOSIS — Z87891 Personal history of nicotine dependence: Secondary | ICD-10-CM

## 2020-02-18 DIAGNOSIS — K2981 Duodenitis with bleeding: Secondary | ICD-10-CM | POA: Diagnosis present

## 2020-02-18 DIAGNOSIS — D62 Acute posthemorrhagic anemia: Secondary | ICD-10-CM | POA: Diagnosis not present

## 2020-02-18 DIAGNOSIS — Z791 Long term (current) use of non-steroidal anti-inflammatories (NSAID): Secondary | ICD-10-CM | POA: Diagnosis not present

## 2020-02-18 DIAGNOSIS — I5032 Chronic diastolic (congestive) heart failure: Secondary | ICD-10-CM | POA: Diagnosis present

## 2020-02-18 DIAGNOSIS — J449 Chronic obstructive pulmonary disease, unspecified: Secondary | ICD-10-CM | POA: Diagnosis present

## 2020-02-18 DIAGNOSIS — K31811 Angiodysplasia of stomach and duodenum with bleeding: Secondary | ICD-10-CM | POA: Diagnosis present

## 2020-02-18 DIAGNOSIS — Z66 Do not resuscitate: Secondary | ICD-10-CM | POA: Diagnosis not present

## 2020-02-18 DIAGNOSIS — K921 Melena: Secondary | ICD-10-CM | POA: Diagnosis not present

## 2020-02-18 DIAGNOSIS — Z809 Family history of malignant neoplasm, unspecified: Secondary | ICD-10-CM

## 2020-02-18 DIAGNOSIS — K449 Diaphragmatic hernia without obstruction or gangrene: Secondary | ICD-10-CM | POA: Diagnosis present

## 2020-02-18 DIAGNOSIS — I10 Essential (primary) hypertension: Secondary | ICD-10-CM | POA: Diagnosis not present

## 2020-02-18 DIAGNOSIS — Z20822 Contact with and (suspected) exposure to covid-19: Secondary | ICD-10-CM | POA: Diagnosis present

## 2020-02-18 DIAGNOSIS — Z7952 Long term (current) use of systemic steroids: Secondary | ICD-10-CM | POA: Diagnosis not present

## 2020-02-18 DIAGNOSIS — Z9981 Dependence on supplemental oxygen: Secondary | ICD-10-CM | POA: Diagnosis not present

## 2020-02-18 DIAGNOSIS — Z8249 Family history of ischemic heart disease and other diseases of the circulatory system: Secondary | ICD-10-CM | POA: Diagnosis not present

## 2020-02-18 DIAGNOSIS — D649 Anemia, unspecified: Secondary | ICD-10-CM | POA: Diagnosis not present

## 2020-02-18 DIAGNOSIS — I110 Hypertensive heart disease with heart failure: Secondary | ICD-10-CM | POA: Diagnosis present

## 2020-02-18 DIAGNOSIS — Z85828 Personal history of other malignant neoplasm of skin: Secondary | ICD-10-CM

## 2020-02-18 DIAGNOSIS — R0602 Shortness of breath: Secondary | ICD-10-CM | POA: Diagnosis present

## 2020-02-18 DIAGNOSIS — R918 Other nonspecific abnormal finding of lung field: Secondary | ICD-10-CM | POA: Diagnosis present

## 2020-02-18 DIAGNOSIS — K922 Gastrointestinal hemorrhage, unspecified: Secondary | ICD-10-CM | POA: Diagnosis present

## 2020-02-18 DIAGNOSIS — K31819 Angiodysplasia of stomach and duodenum without bleeding: Secondary | ICD-10-CM | POA: Diagnosis not present

## 2020-02-18 DIAGNOSIS — Z7951 Long term (current) use of inhaled steroids: Secondary | ICD-10-CM

## 2020-02-18 DIAGNOSIS — Z86711 Personal history of pulmonary embolism: Secondary | ICD-10-CM | POA: Diagnosis not present

## 2020-02-18 DIAGNOSIS — Z7901 Long term (current) use of anticoagulants: Secondary | ICD-10-CM | POA: Diagnosis not present

## 2020-02-18 DIAGNOSIS — E876 Hypokalemia: Secondary | ICD-10-CM | POA: Diagnosis present

## 2020-02-18 DIAGNOSIS — J9611 Chronic respiratory failure with hypoxia: Secondary | ICD-10-CM | POA: Diagnosis not present

## 2020-02-18 DIAGNOSIS — E785 Hyperlipidemia, unspecified: Secondary | ICD-10-CM | POA: Diagnosis present

## 2020-02-18 DIAGNOSIS — Z7982 Long term (current) use of aspirin: Secondary | ICD-10-CM

## 2020-02-18 DIAGNOSIS — J439 Emphysema, unspecified: Secondary | ICD-10-CM | POA: Diagnosis present

## 2020-02-18 DIAGNOSIS — Z8701 Personal history of pneumonia (recurrent): Secondary | ICD-10-CM

## 2020-02-18 DIAGNOSIS — R195 Other fecal abnormalities: Secondary | ICD-10-CM | POA: Diagnosis not present

## 2020-02-18 DIAGNOSIS — Z79899 Other long term (current) drug therapy: Secondary | ICD-10-CM

## 2020-02-18 HISTORY — DX: Gastrointestinal hemorrhage, unspecified: K92.2

## 2020-02-18 LAB — CBC WITH DIFFERENTIAL/PLATELET
Abs Immature Granulocytes: 0.22 10*3/uL — ABNORMAL HIGH (ref 0.00–0.07)
Basophils Absolute: 0 10*3/uL (ref 0.0–0.1)
Basophils Relative: 0 %
Eosinophils Absolute: 0 10*3/uL (ref 0.0–0.5)
Eosinophils Relative: 0 %
HCT: 18.1 % — ABNORMAL LOW (ref 39.0–52.0)
Hemoglobin: 5 g/dL — CL (ref 13.0–17.0)
Immature Granulocytes: 2 %
Lymphocytes Relative: 6 %
Lymphs Abs: 0.7 10*3/uL (ref 0.7–4.0)
MCH: 25.6 pg — ABNORMAL LOW (ref 26.0–34.0)
MCHC: 27.6 g/dL — ABNORMAL LOW (ref 30.0–36.0)
MCV: 92.8 fL (ref 80.0–100.0)
Monocytes Absolute: 1 10*3/uL (ref 0.1–1.0)
Monocytes Relative: 9 %
Neutro Abs: 9.7 10*3/uL — ABNORMAL HIGH (ref 1.7–7.7)
Neutrophils Relative %: 83 %
Platelets: 339 10*3/uL (ref 150–400)
RBC: 1.95 MIL/uL — ABNORMAL LOW (ref 4.22–5.81)
RDW: 17.1 % — ABNORMAL HIGH (ref 11.5–15.5)
WBC: 11.6 10*3/uL — ABNORMAL HIGH (ref 4.0–10.5)
nRBC: 0 % (ref 0.0–0.2)

## 2020-02-18 LAB — COMPREHENSIVE METABOLIC PANEL
ALT: 20 U/L (ref 0–44)
AST: 23 U/L (ref 15–41)
Albumin: 2.8 g/dL — ABNORMAL LOW (ref 3.5–5.0)
Alkaline Phosphatase: 68 U/L (ref 38–126)
Anion gap: 13 (ref 5–15)
BUN: 23 mg/dL (ref 8–23)
CO2: 25 mmol/L (ref 22–32)
Calcium: 9.2 mg/dL (ref 8.9–10.3)
Chloride: 100 mmol/L (ref 98–111)
Creatinine, Ser: 1.22 mg/dL (ref 0.61–1.24)
GFR calc Af Amer: >60 mL/min (ref >60)
GFR calc non Af Amer: 59 mL/min — ABNORMAL LOW (ref >60)
Glucose, Bld: 100 mg/dL — ABNORMAL HIGH (ref 70–99)
Potassium: 4.8 mmol/L (ref 3.5–5.1)
Sodium: 138 mmol/L (ref 135–145)
Total Bilirubin: 0.6 mg/dL (ref 0.3–1.2)
Total Protein: 5.7 g/dL — ABNORMAL LOW (ref 6.5–8.1)

## 2020-02-18 LAB — PROTIME-INR
INR: 2.4 — ABNORMAL HIGH (ref 0.8–1.2)
Prothrombin Time: 25.1 seconds — ABNORMAL HIGH (ref 11.4–15.2)

## 2020-02-18 LAB — POC OCCULT BLOOD, ED: Fecal Occult Bld: POSITIVE — AB

## 2020-02-18 LAB — SARS CORONAVIRUS 2 BY RT PCR (HOSPITAL ORDER, PERFORMED IN ~~LOC~~ HOSPITAL LAB): SARS Coronavirus 2: NEGATIVE

## 2020-02-18 LAB — LACTIC ACID, PLASMA: Lactic Acid, Venous: 2.4 mmol/L (ref 0.5–1.9)

## 2020-02-18 LAB — PREPARE RBC (CROSSMATCH)

## 2020-02-18 IMAGING — DX DG CHEST 1V PORT
1 series · 1 of 1 positions shown · non-contrast
Comparison: Chest radiograph dated [DATE].

CLINICAL DATA: 74-year-old male with shortness of breath.

EXAM:
PORTABLE CHEST 1 VIEW

[chest ap]
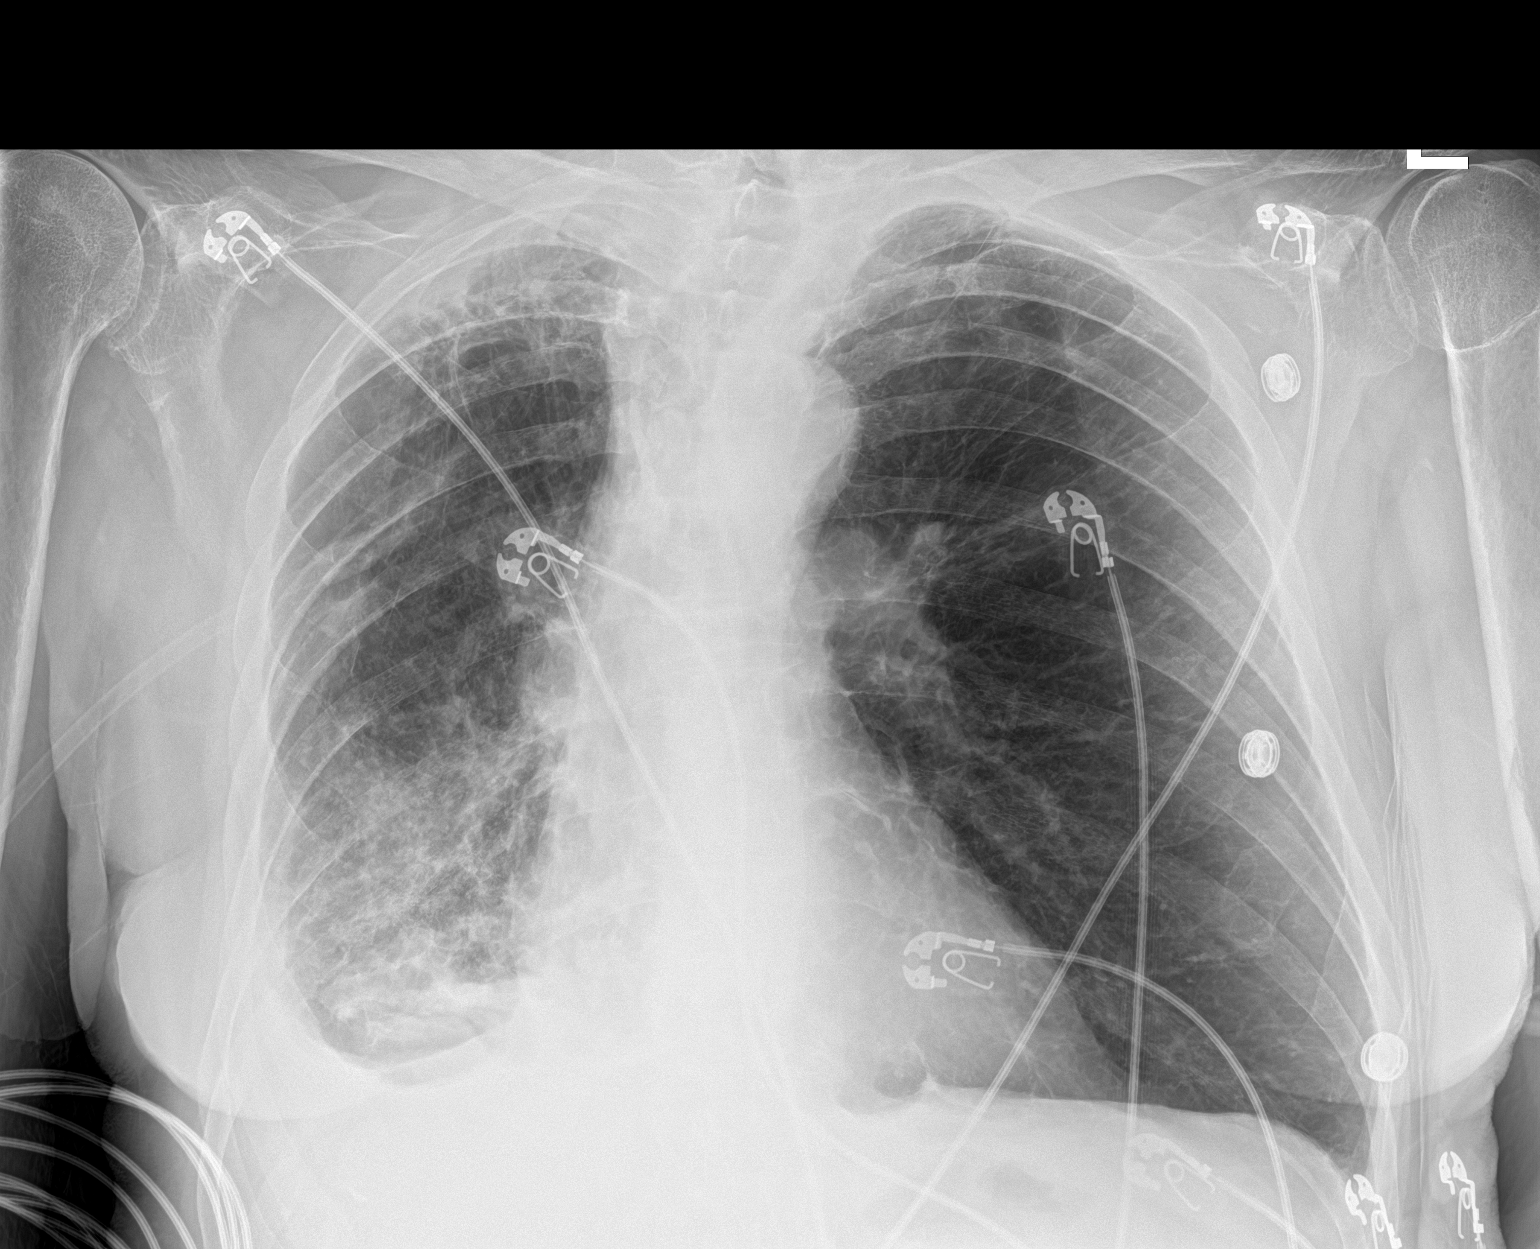

[1 of 1 positions shown; findings below may reference images not displayed]

FINDINGS: There is a small right pleural effusion. Diffuse right lung
interstitial coarsening and opacities most consistent with
infiltrate and relative the unchanged. Areas of subpleural scarring
noted in the right apex and right lung base with overall decreased
volume loss in the right lung. There is background of emphysema. No
pneumothorax. The cardiac silhouette is within limits. No acute
osseous pathology.
IMPRESSION: Small right pleural effusion and right lung infiltrate similar to
prior radiograph.

## 2020-02-18 MED ORDER — THIAMINE HCL 100 MG PO TABS
100.0000 mg | ORAL_TABLET | Freq: Every day | ORAL | Status: DC
  Administered 2020-02-19 – 2020-02-25 (×6): 100 mg via ORAL
  Filled 2020-02-18 (×6): qty 1

## 2020-02-18 MED ORDER — ACETAMINOPHEN 650 MG RE SUPP: 650.0000 mg | Freq: Four times a day (QID) | RECTAL | Status: DC | PRN

## 2020-02-18 MED ORDER — PANTOPRAZOLE SODIUM 40 MG IV SOLR
40.0000 mg | Freq: Two times a day (BID) | INTRAVENOUS | Status: AC
  Administered 2020-02-19 – 2020-02-21 (×6): 40 mg via INTRAVENOUS
  Filled 2020-02-18 (×6): qty 40

## 2020-02-18 MED ORDER — CYCLOSPORINE 0.05 % OP EMUL
1.0000 [drp] | Freq: Two times a day (BID) | OPHTHALMIC | Status: DC | PRN
  Filled 2020-02-18: qty 1

## 2020-02-18 MED ORDER — SODIUM CHLORIDE 0.9 % IV BOLUS (SEPSIS)
500.0000 mL | Freq: Once | INTRAVENOUS | Status: AC
  Administered 2020-02-18: 500 mL via INTRAVENOUS

## 2020-02-18 MED ORDER — SODIUM CHLORIDE 0.9 % IV SOLN
80.0000 mg | Freq: Once | INTRAVENOUS | Status: AC
  Administered 2020-02-19: 80 mg via INTRAVENOUS
  Filled 2020-02-18: qty 80

## 2020-02-18 MED ORDER — SODIUM CHLORIDE 3 % IN NEBU
4.0000 mL | INHALATION_SOLUTION | Freq: Every day | RESPIRATORY_TRACT | Status: AC | PRN
  Filled 2020-02-18: qty 4

## 2020-02-18 MED ORDER — FLUTICASONE PROPIONATE 50 MCG/ACT NA SUSP
2.0000 | Freq: Every day | NASAL | Status: DC
  Administered 2020-02-20 – 2020-02-25 (×6): 2 via NASAL
  Filled 2020-02-18 (×2): qty 16

## 2020-02-18 MED ORDER — IPRATROPIUM-ALBUTEROL 0.5-2.5 (3) MG/3ML IN SOLN: 3.0000 mL | RESPIRATORY_TRACT | Status: DC | PRN

## 2020-02-18 MED ORDER — SODIUM CHLORIDE 0.9 % IV SOLN
10.0000 mL/h | Freq: Once | INTRAVENOUS | Status: AC
  Administered 2020-02-18: 10 mL/h via INTRAVENOUS

## 2020-02-18 MED ORDER — LORAZEPAM 1 MG PO TABS: 1.0000 mg | ORAL_TABLET | ORAL | Status: AC | PRN

## 2020-02-18 MED ORDER — FLUTICASONE FUROATE-VILANTEROL 200-25 MCG/INH IN AEPB
1.0000 | INHALATION_SPRAY | Freq: Every day | RESPIRATORY_TRACT | Status: DC
  Filled 2020-02-18: qty 28

## 2020-02-18 MED ORDER — ROFLUMILAST 500 MCG PO TABS
500.0000 ug | ORAL_TABLET | Freq: Every day | ORAL | Status: DC
  Administered 2020-02-19 – 2020-02-25 (×7): 500 ug via ORAL
  Filled 2020-02-18 (×7): qty 1

## 2020-02-18 MED ORDER — TIOTROPIUM BROMIDE MONOHYDRATE 2.5 MCG/ACT IN AERS: 2.0000 | INHALATION_SPRAY | Freq: Every day | RESPIRATORY_TRACT | Status: DC

## 2020-02-18 MED ORDER — TRAMADOL HCL 50 MG PO TABS: 50.0000 mg | ORAL_TABLET | Freq: Four times a day (QID) | ORAL | Status: DC | PRN

## 2020-02-18 MED ORDER — THIAMINE HCL 100 MG/ML IJ SOLN
100.0000 mg | Freq: Every day | INTRAMUSCULAR | Status: DC
  Administered 2020-02-20: 100 mg via INTRAVENOUS
  Filled 2020-02-18: qty 2

## 2020-02-18 MED ORDER — PREDNISONE 10 MG PO TABS
10.0000 mg | ORAL_TABLET | Freq: Every day | ORAL | Status: DC
  Administered 2020-02-19 – 2020-02-25 (×7): 10 mg via ORAL
  Filled 2020-02-18 (×7): qty 1

## 2020-02-18 MED ORDER — FUROSEMIDE 20 MG PO TABS
20.0000 mg | ORAL_TABLET | Freq: Every day | ORAL | Status: DC
  Administered 2020-02-19 – 2020-02-25 (×7): 20 mg via ORAL
  Filled 2020-02-18 (×7): qty 1

## 2020-02-18 MED ORDER — ONDANSETRON HCL 4 MG PO TABS: 4.0000 mg | ORAL_TABLET | Freq: Four times a day (QID) | ORAL | Status: DC | PRN

## 2020-02-18 MED ORDER — FUROSEMIDE 10 MG/ML IJ SOLN
20.0000 mg | Freq: Once | INTRAMUSCULAR | Status: AC
  Administered 2020-02-18: 20 mg via INTRAVENOUS
  Filled 2020-02-18: qty 2

## 2020-02-18 MED ORDER — ACETAMINOPHEN 325 MG PO TABS
650.0000 mg | ORAL_TABLET | Freq: Four times a day (QID) | ORAL | Status: DC | PRN
  Administered 2020-02-20 – 2020-02-25 (×2): 650 mg via ORAL
  Filled 2020-02-18 (×2): qty 2

## 2020-02-18 MED ORDER — AZITHROMYCIN 250 MG PO TABS
250.0000 mg | ORAL_TABLET | ORAL | Status: DC
  Administered 2020-02-21 – 2020-02-25 (×4): 250 mg via ORAL
  Filled 2020-02-18 (×5): qty 1

## 2020-02-18 MED ORDER — UMECLIDINIUM BROMIDE 62.5 MCG/INH IN AEPB
1.0000 | INHALATION_SPRAY | Freq: Every day | RESPIRATORY_TRACT | Status: DC
  Filled 2020-02-18: qty 7

## 2020-02-18 MED ORDER — ONDANSETRON HCL 4 MG/2ML IJ SOLN: 4.0000 mg | Freq: Four times a day (QID) | INTRAMUSCULAR | Status: DC | PRN

## 2020-02-18 MED ORDER — ADULT MULTIVITAMIN W/MINERALS CH
1.0000 | ORAL_TABLET | Freq: Every day | ORAL | Status: DC
  Administered 2020-02-19 – 2020-02-25 (×7): 1 via ORAL
  Filled 2020-02-18 (×7): qty 1

## 2020-02-18 MED ORDER — FOLIC ACID 1 MG PO TABS
1.0000 mg | ORAL_TABLET | Freq: Every day | ORAL | Status: DC
  Administered 2020-02-19 – 2020-02-25 (×7): 1 mg via ORAL
  Filled 2020-02-18 (×7): qty 1

## 2020-02-18 MED ORDER — LORAZEPAM 2 MG/ML IJ SOLN: 1.0000 mg | INTRAMUSCULAR | Status: AC | PRN

## 2020-02-18 NOTE — ED Triage Notes (Signed)
Per EMS. Pt coming from home with abnormal Hgb reading on 5.1 . Currently has a diagnosis of pneumonia with increase SOB. Normally wears 4-5 L by nasal canula at home EMS had pt on non-rebreather. Reported HR as being tachy as high as 123bpm.

## 2020-02-18 NOTE — Telephone Encounter (Signed)
Amy from [facility] called stating she ran a BMP on pt. [facility] called her stating pts hemoglobin was 5.1. Amy asked if pt should go to ED or if there is a way Dr. Yong Channel can admit him. Please advise.

## 2020-02-18 NOTE — Telephone Encounter (Signed)
Called amy let know we can not admit she will direct patient to ED

## 2020-02-18 NOTE — H&P (Signed)
History and Physical    Francisco [name] ZOX:[id number] DOB: 08-14-1945 DOA: 02/18/2020  PCP: [name], MD  Patient coming from: Home   Chief Complaint:   Shortness of breath, weakness  HPI:    74 year old male with past medical history of advanced COPD, chronic respiratory failure on 4 L of oxygen via nasal cannula, diastolic congestive heart failure (echo 10/2017 EF 65-70%), severe peripheral vascular disease (S/P endarterectomy right femoral in 2020 and left femoral 01/2020), hypertension, history of PE on chronic anticoagulation with Xarelto who presents to [facility] emergency department after being sent over by his vascular surgeon [name] for acute anemia.  Of note, patient was recently hospitalized at [facility] from 7/16-7/26 for left femoral endarterectomy due to critical limb ischemia of left lower extremity.  During that hospitalization, the patient was noted to be anemic however this was felt to be perioperative anemia.  The patient was transfused 2 units of packed red blood cells and eventually discharged home with a hemoglobin of 9.5 on 7/26.  Patient's home regimen of Xarelto 20 mg daily was resumed as well as his home regimen of aspirin 81 mg daily at time of discharge.  Patient explains that since he was discharged, he has been experiencing frequent bouts of black tarry stool.  This had been happening at least once daily.  Patient denies any associated abdominal pain, nausea, vomiting, changes in appetite.  In the weeks that followed, patient developed progressively worsening generalized weakness, shortness of breath beyond his baseline and lightheadedness.  The patient has become so weak that he requires the assistance of his family to even get out of his bed or chair where at baseline he is typically able to ambulate on his own.  Patient followed with [name] in clinic on 8/11 who obtained a CBC revealed a hemoglobin of 5.0.  Patient was instructed to  go to [facility] emergency department for evaluation at this time.  Upon evaluation in the emergency department CBC did confirm hemoglobin of 5.0, down from 9.5 in late July.  2 units of packed red blood cells were ordered.  Coagulation profile did reveal an INR of 2.4.  80 mg of Protonix were administered.  500 cc of normal saline was administered.  The hospitalist group was then called to assess the patient for admission to the hospital.    Review of Systems:   Review of Systems  Constitutional: Positive for malaise/fatigue.  Gastrointestinal: Positive for melena.  Neurological: Positive for weakness.  All other systems reviewed and are negative.     Past Medical History:  Diagnosis Date  . Chronic back pain    "mid and lower" (04/07/2018)  . Chronic rhinitis    -Sinus Ct 08/01/2009 >> Bilateral maxillary sinusitis with some mucosal thickeningin the sphenoid and frontal sinuses as well with air fluid levels present -chronic rhinitis flyer Aug 04, 2009  . Compressed spine fracture ([place])   . COPD (chronic obstructive pulmonary disease) [facility])    PFT's rec Jul 17, 2009  . Emphysema lung ([place])   . On home oxygen therapy    "3L; 24/7" (04/07/2018)  . Onychomycosis    Dr. Judi Cong  . Orthostatic hypotension    "since 10/2017" (04/07/2018)  . PAD (peripheral artery disease) ([place])   . Pneumonia    "twice in 1 year" (04/07/2018)  . Pulmonary embolism ([place]) 04/07/2018  . Skin cancer    "lips, face, ears, arms" (04/07/2018)  . Vertigo    "  since ~ 02/2018" (04/07/2018)    Past Surgical History:  Procedure Laterality Date  . ABDOMINAL AORTOGRAM W/LOWER EXTREMITY N/A 01/21/2020   Procedure: ABDOMINAL AORTOGRAM W/LOWER EXTREMITY;  Surgeon: Elam Dutch, MD;  Location: [place] [facility];  Service: Cardiovascular;  Laterality: N/A;  . APPENDECTOMY    . CATARACT EXTRACTION, BILATERAL    . ENDARTERECTOMY FEMORAL Right 04/19/2019   Procedure: ENDARTERECTOMY RIGHT COMMON FEMORAL;   Surgeon: Elam Dutch, MD;  Location: [place];  Service: Vascular;  Laterality: Right;  . ENDARTERECTOMY FEMORAL Left 01/24/2020   Procedure: LEFT FEMORAL ENDARTERECTOMY WITH DACRON PATCH ANGIOPLASTY;  Surgeon: Elam Dutch, MD;  Location: [place];  Service: Vascular;  Laterality: Left;  . FEMORAL ENDARTERECTOMY Left 01/24/2020  . INSERTION OF ILIAC STENT Left 01/24/2020   Procedure: INSERTION OF VBX STENT 8X59 AND 8X39 LEFT COMMON ILIAC ARTERY. INSERTION OF INNOVA 7 X 60 INNOVA STENT LEFT EXTERNAL ILIAC ARTERY. ;  Surgeon: Elam Dutch, MD;  Location: [place];  Service: Vascular;  Laterality: Left;  . LOWER EXTREMITY ANGIOGRAPHY  12/11/2018  . LOWER EXTREMITY ANGIOGRAPHY N/A 12/11/2018   Procedure: LOWER EXTREMITY ANGIOGRAPHY;  Surgeon: Elam Dutch, MD;  Location: [place] [facility];  Service: Cardiovascular;  Laterality: N/A;  . PATCH ANGIOPLASTY Right 04/19/2019   Procedure: Patch Angioplasty;  Surgeon: Elam Dutch, MD;  Location: [place];  Service: Vascular;  Laterality: Right;  . PERIPHERAL VASCULAR INTERVENTION Right 12/11/2018   Procedure: PERIPHERAL VASCULAR INTERVENTION;  Surgeon: Elam Dutch, MD;  Location: [place] [facility];  Service: Cardiovascular;  Laterality: Right;  Common Iliac   . SKIN CANCER EXCISION     "lips, face, ears, arms" (04/07/2018)  . TONSILLECTOMY    . ULTRASOUND GUIDANCE FOR VASCULAR ACCESS Right 01/24/2020   Procedure: ULTRASOUND GUIDANCE FOR VASCULAR ACCESS;  Surgeon: Elam Dutch, MD;  Location: [place];  Service: Vascular;  Laterality: Right;     reports that he quit smoking about 2 years ago. His smoking use included pipe and cigarettes. He started smoking about 54 years ago. He has a 104.00 pack-year smoking history. He has never used smokeless tobacco. He reports current alcohol use of about 28.0 standard drinks of alcohol per week. He reports that he does not use drugs.  Allergies  Allergen Reactions  . Tape Other (See Comments)     SKIN IS VERY THIN, TEARS SKIN; CAN ONLY USE COBAN WRAPS DUE TO CONDITION OF SKIN!!  . Ciprofloxacin Nausea Only    Sick on stomach, weak/tired  . Levaquin [Levofloxacin] Other (See Comments)    hallucinations    Family History  Problem Relation Age of Onset  . Heart disease Mother        CABG in her 40s, nonsmoker  . Cancer Mother   . Stroke Father   . Heart disease Father        Died of MI at age 47, smoker  . Hepatitis Sister   . Coronary artery disease Other        male 1st degree relative <60     Prior to Admission medications   Medication Sig Start Date End Date Taking? Authorizing Provider  acetaminophen (TYLENOL) 500 MG tablet Take 1,000 mg by mouth every 6 (six) hours as needed for moderate pain.    Yes [provider]  albuterol (VENTOLIN HFA) 108 (90 Base) MCG/ACT inhaler USE 2 PUFFS EVERY 6 HOURS  AS NEEDED FOR WHEEZING Patient taking differently: Inhale 2 puffs into the  lungs every 6 (six) hours as needed for wheezing or shortness of breath.  07/30/19  Yes Martyn Ehrich, NP  aspirin EC 81 MG EC tablet Take 1 tablet (81 mg total) by mouth daily. Swallow whole. 01/31/20  Yes Dagoberto Ligas, PA-C  azithromycin (ZITHROMAX) 250 MG tablet Take 1 tablet (250 mg total) by mouth every Monday, Wednesday, and Friday. 04/30/19  Yes Lauraine Rinne, NP  budesonide-formoterol (SYMBICORT) 160-4.5 MCG/ACT inhaler Inhale 2 puffs into the lungs 2 (two) times daily.   Yes [provider]  Calcium Carbonate-Vitamin D (CALCIUM 600+D PO) Take 2 tablets by mouth daily.   Yes [provider]  cycloSPORINE (RESTASIS) 0.05 % ophthalmic emulsion Place 1 drop into both eyes 2 (two) times daily as needed (dry eyes).    Yes [provider]  fluticasone (FLONASE) 50 MCG/ACT nasal spray Place 2 sprays into both nostrils daily. 11/29/19  Yes [name], MD  furosemide (LASIX) 20 MG tablet TAKE 1 TABLET BY MOUTH  DAILY Patient taking differently: Take 20 mg  by mouth daily.  11/29/19  Yes [name], MD  OXYGEN Inhale 4-5 L into the lungs continuous.    Yes [provider]  predniSONE (DELTASONE) 10 MG tablet Take 1 tablet (10 mg total) by mouth daily with breakfast. 09/21/19  Yes Lauraine Rinne, NP  rivaroxaban (XARELTO) 20 MG TABS tablet TAKE 1 TABLET BY MOUTH  DAILY Patient taking differently: Take 20 mg by mouth daily.  06/02/19  Yes [name], MD  roflumilast (DALIRESP) 500 MCG TABS tablet Take 1 tablet (500 mcg total) by mouth daily. 08/02/19  Yes Brand Males, MD  sodium chloride HYPERTONIC 3 % nebulizer solution Take by nebulization daily. Patient taking differently: Take 4 mLs by nebulization daily as needed (to loosen phlegm).  11/11/19  Yes Brand Males, MD  SPIRIVA RESPIMAT 2.5 MCG/ACT AERS USE 2 INHALATIONS BY MOUTH  ONCE DAILY Patient taking differently: Take 2 puffs by mouth daily.  03/03/19  Yes Brand Males, MD  traMADol (ULTRAM) 50 MG tablet Take 1 tablet (50 mg total) by mouth every 12 (twelve) hours as needed for moderate pain or severe pain. Patient taking differently: Take 50 mg by mouth See admin instructions. Take one tablet (50 mg) by mouth daily at bedtime, may also take one tablet (50 mg) 12 hours later if still needed for pain 01/31/20  Yes Dagoberto Ligas, PA-C  Respiratory Therapy Supplies (FLUTTER) DEVI 10 times Twice a day and prn as needed, may increase if feeling worse 10/15/19   Lauraine Rinne, NP    Physical Exam: Vitals:   02/18/20 1837 02/18/20 2000 02/18/20 2015 02/18/20 2024  BP: 125/85 137/79 (!) 145/69   Pulse: (!) 110 (!) 116 (!) 106 (!) 111  Resp: 20  20 19   Temp: 98.2 F (36.8 C)     TempSrc: Oral     SpO2:  100% 100% 100%    Constitutional: Acute alert and oriented x3, patient is in mild respiratory distress. Skin: Notable hyperemia of the distal bilateral lower extremities with associated warmth and dry scaly skin but no tenderness.  No evidence of discharge or foul  smell.  Additionally, several of the toes of the left foot have a necrotic appearance with clear demarcation.  Good skin turgor noted otherwise. Eyes: Pupils are equally reactive to light.  Increased conjunctival pallor noted without scleral icterus. ENMT: Moist mucous membranes noted.  Posterior pharynx clear of any exudate or lesions.  Neck: normal, supple, no masses, no thyromegaly.  No evidence of jugular venous distension.   Respiratory: Coarse breath sounds throughout the right lung field and left lung base with bibasilar rales.  No evidence of wheezing at this time.  Patient is tachypneic without evidence of accessory muscle use.  Cardiovascular: Tachycardic rate with regular rhythm no murmurs / rubs / gallops.  Bilateral lower extremity pitting edema that tracks up to the knees. 2+ pedal pulses. No carotid bruits.  Chest:   Nontender without crepitus or deformity.   Back:   Nontender without crepitus or deformity. Abdomen: Abdomen is soft and nontender.  No evidence of intra-abdominal masses.  Positive bowel sounds noted in all quadrants.   Musculoskeletal: Please see skin examination findings above.  Notable necrosis of several toes of the left foot as mentioned in the skin examination.  Good ROM, no contractures. Normal muscle tone.  Neurologic: CN 2-12 grossly intact. Sensation intact, patient is moving all 4 extremity spontaneously.  Patient is following all commands.  Patient is responsive to verbal stimuli.   Psychiatric: Patient presents as a normal mood with appropriate affect.  Patient seems to possess insight as to theircurrent situation.     Labs on Admission: I have personally reviewed following labs and imaging studies -   CBC: Recent Labs  Lab 02/18/20 1645  WBC 11.6*  NEUTROABS 9.7*  HGB 5.0*  HCT 18.1*  MCV 92.8  PLT 893   Basic Metabolic Panel: Recent Labs  Lab 02/18/20 1645  NA 138  K 4.8  CL 100  CO2 25  GLUCOSE 100*  BUN 23  CREATININE 1.22  CALCIUM  9.2   GFR: Estimated Creatinine Clearance: 56.2 mL/min (by C-G formula based on SCr of 1.22 mg/dL). Liver Function Tests: Recent Labs  Lab 02/18/20 1645  AST 23  ALT 20  ALKPHOS 68  BILITOT 0.6  PROT 5.7*  ALBUMIN 2.8*   No results for input(s): LIPASE, AMYLASE in the last 168 hours. No results for input(s): AMMONIA in the last 168 hours. Coagulation Profile: Recent Labs  Lab 02/18/20 1815  INR 2.4*   Cardiac Enzymes: No results for input(s): CKTOTAL, CKMB, CKMBINDEX, TROPONINI in the last 168 hours. BNP (last 3 results) No results for input(s): PROBNP in the last 8760 hours. HbA1C: No results for input(s): HGBA1C in the last 72 hours. CBG: No results for input(s): GLUCAP in the last 168 hours. Lipid Profile: No results for input(s): CHOL, HDL, LDLCALC, TRIG, CHOLHDL, LDLDIRECT in the last 72 hours. Thyroid Function Tests: No results for input(s): TSH, T4TOTAL, FREET4, T3FREE, THYROIDAB in the last 72 hours. Anemia Panel: No results for input(s): VITAMINB12, FOLATE, FERRITIN, TIBC, IRON, RETICCTPCT in the last 72 hours. Urine analysis:    Component Value Date/Time   COLORURINE YELLOW 04/15/2019 1530   APPEARANCEUR CLEAR 04/15/2019 1530   LABSPEC 1.012 04/15/2019 1530   PHURINE 7.0 04/15/2019 1530   GLUCOSEU NEGATIVE 04/15/2019 1530   GLUCOSEU NEGATIVE 06/25/2017 0831   HGBUR SMALL (A) 04/15/2019 1530   HGBUR trace-lysed 02/12/2010 0819   BILIRUBINUR NEGATIVE 04/15/2019 1530   BILIRUBINUR Negative 12/25/2017 0852   KETONESUR NEGATIVE 04/15/2019 1530   PROTEINUR NEGATIVE 04/15/2019 1530   UROBILINOGEN 0.2 12/25/2017 0852   UROBILINOGEN 0.2 06/25/2017 0831   NITRITE NEGATIVE 04/15/2019 1530   LEUKOCYTESUR NEGATIVE 04/15/2019 1530    Radiological Exams on Admission - Personally Reviewed: DG Chest Port 1 View  Result Date: 02/18/2020 CLINICAL DATA:  74 year old male with shortness of breath. EXAM: PORTABLE  CHEST 1 VIEW COMPARISON:  Chest radiograph dated  01/27/2020. FINDINGS: There is a small right pleural effusion. Diffuse right lung interstitial coarsening and opacities most consistent with infiltrate and relative the unchanged. Areas of subpleural scarring noted in the right apex and right lung base with overall decreased volume loss in the right lung. There is background of emphysema. No pneumothorax. The cardiac silhouette is within limits. No acute osseous pathology. IMPRESSION: Small right pleural effusion and right lung infiltrate similar to prior radiograph. Electronically Signed   By: Anner Crete M.D.   On: 02/18/2020 17:07    EKG: Personally reviewed.  Rhythm is sinus tachycardia with heart rate of 117BPM.  Notable PVC's, poor R wave progression .   Assessment/Plan Principal Problem:   Acute GI bleeding   Patient presenting with Hgb of 5 in the setting of an approximate 3 week history of melena suggestive of upper GI bleeding  Holding  Home regimen of Xarelto/Aspirin  2 units PRBC transfusion ordered by the ED.  I have ordered Lasix to be administered inbetween units.  CBC's Q6hrs, will continue to transfuse to achieve target hgb of at least 7  Considering advanced COPD and risks of sedation with EGD, I anticipate GI will try to manage conservatively for now.  Epic secure chat consultation request has been placed  Clear liquid diet  Admit to stepdown unit  Protonix IV 80mg  x 1 followed by 40mg  IV Q12hrs  Active Problems:   Acute blood loss anemia   Please see A/P above.    Lactic acidosis   Likely secondary to severe anemia  Transfusing patient  Monitor lactic acid levels to ensure downtrending    Chronic respiratory failure with hypoxia (HCC)   Continue to provide 3 liters at rest and 4 liters with exertion to achieve target SpO2 88-92%    Stage 4 very severe COPD by GOLD classification ([place])   Continue home regimen of maintenance inhalers, PRN Duonebs    PAD (peripheral artery disease)  (HCC)   Advanced disease with significant hyperemia of the distal bilateral lower extremities - I am unsure that there is any underlying cellulitis as patient/wife state this is chronic  Additionally patient has necrotic appearance of multiple toes of left foot however this has been seen by vascular 2 days ago and wife/husband state that this is stable/improved from last month    Lung infiltrate on CT  Patient has followed with [name] as of late for "frequent bouts of bronchitis" and has received several rounds of recent abx, last ID visit was in mid July where [name] states he felt further abx werent indicated  CT done by [name] with Pulmonary 2 days ago revealing new multifocal infiltrates of the right lung.    That being said, patient denies changes in his chronic productive cough   In the absence of fever and mild leukocytosis (which is stable compared to prior CBC's - likely due to steriods) I am skeptical as to whether patient's new infiltrates are infection.  Will get CRP, procalcitonin, sputum and blood cultures - will start abx if any of these suggest infection  Will ask day team to get pulmonary consultation in the AM as I am concerned these infiltrates may have a different etiology ( malignancy possibly) and may need bronch or evaluate.     DNR (do not resuscitate)   Confirmed DNR this hospitalization  Code Status:  DNR Family Communication: Wife is at bedside and has been updated on plan  of care.   Status is: Inpatient  Remains inpatient appropriate because:Ongoing diagnostic testing needed not appropriate for outpatient work up, IV treatments appropriate due to intensity of illness or inability to take PO and Inpatient level of care appropriate due to severity of illness   Dispo: [name]              Anticipated d/c is to: Home              Anticipated d/c date is: > 3 days              Patient currently is not medically stable to  d/c.        Vernelle Emerald MD [facility] Pager [phone]  If 7PM-7AM, please contact night-coverage [email or web address] Use universal [place] password for that web site. If you do not have the password, please call the hospital operator.  02/18/2020, 8:27 PM

## 2020-02-18 NOTE — Progress Notes (Addendum)
Blood transfusion not documented as completed by ED staff. Admission vitals are within half an hour of ending transfusion. No order present for post 2 hour H&H. MD notified.  Addendum: orders clarified. Second transfusion underway.

## 2020-02-18 NOTE — ED Provider Notes (Signed)
[facility] EMERGENCY DEPARTMENT Provider Note   CSN: [id number] Arrival date & time: 02/18/20  1622     History Chief Complaint  Patient presents with  . abnormal lab    Francisco [name] is a 74 y.o. male.  Patient is a 74 year old gentleman with multiple medical problems including severe COPD, multiple recurrent pneumonias, peripheral vascular disease presenting to the emergency department for shortness of breath and low hemoglobin.  Patient was recently discharged from the hospital for a lengthy stay for pneumonia.  He had follow-up with vascular surgery yesterday for necrotic toes on the left.  They did routine blood work and reportedly his hemoglobin was 5.1.  Patient reports that he has had significant shortness of breath since he was released from the hospital July 26.  He is on Xarelto.  He reports he has had dark brown stools but no gross hematuria no hematemesis.  He is on 4 L of oxygen chronically.  He denies any fevers, chills.  Patient recently received a blood transfusion on July 20 during his recent hospitalization.  He was diagnosed with postop anemia following an aortogram.        Past Medical History:  Diagnosis Date  . Chronic back pain    "mid and lower" (04/07/2018)  . Chronic rhinitis    -Sinus Ct 08/01/2009 >> Bilateral maxillary sinusitis with some mucosal thickeningin the sphenoid and frontal sinuses as well with air fluid levels present -chronic rhinitis flyer Aug 04, 2009  . Compressed spine fracture ([place])   . COPD (chronic obstructive pulmonary disease) [facility])    PFT's rec Jul 17, 2009  . Emphysema lung ([place])   . On home oxygen therapy    "3L; 24/7" (04/07/2018)  . Onychomycosis    Dr. Judi Cong  . Orthostatic hypotension    "since 10/2017" (04/07/2018)  . PAD (peripheral artery disease) ([place])   . Pneumonia    "twice in 1 year" (04/07/2018)  . Pulmonary embolism ([place]) 04/07/2018  . Skin cancer    "lips, face, ears, arms" (04/07/2018)  .  Vertigo    "since ~ 02/2018" (04/07/2018)    Patient Active Problem List   Diagnosis Date Noted  . Acute exacerbation of chronic bronchitis ([place]) 11/30/2019  . PAD (peripheral artery disease) ([place]) 12/11/2018  . Pre-operative respiratory examination 12/10/2018  . COPD with acute exacerbation ([place]) 11/27/2018  . Stage 4 very severe COPD by GOLD classification ([place]) 11/27/2018  . DNR (do not resuscitate) 04/21/2018  . Diastolic dysfunction 40/98/1191  . Chronic pulmonary embolism ([place]) 04/07/2018  . Essential tremor 03/31/2018  . BPPV (benign paroxysmal positional vertigo), right 02/10/2018  . Osteoporosis 11/19/2017  . Syncope 11/02/2017  . Thoracic compression fracture ([place]) 11/02/2017  . Essential hypertension 11/02/2017  . Venous stasis dermatitis of both lower extremities 11/02/2017  . Chronic respiratory failure with hypoxia ([place]) 09/23/2017  . BPH associated with nocturia 06/25/2017  . Leukocytosis 12/19/2016  . Hyperglycemia 06/12/2016  . Hyperlipidemia 07/22/2014  . Onychomycosis 07/22/2014  . Left ankle swelling 07/22/2014  . History of skin cancer 05/17/2014  . Chronic rhinitis 10/28/2012  . Pulmonary nodule 09/23/2011  . Former smoker 08/23/2008  . COPD (chronic obstructive pulmonary disease) with emphysema ([place]) 08/23/2008  . History of colonic polyps 08/23/2008    Past Surgical History:  Procedure Laterality Date  . ABDOMINAL AORTOGRAM W/LOWER EXTREMITY N/A 01/21/2020   Procedure: ABDOMINAL AORTOGRAM W/LOWER EXTREMITY;  Surgeon: Elam Dutch, MD;  Location: [facility];  Service: Cardiovascular;  Laterality: N/A;  . APPENDECTOMY    . CATARACT EXTRACTION, BILATERAL    . ENDARTERECTOMY FEMORAL Right 04/19/2019   Procedure: ENDARTERECTOMY RIGHT COMMON FEMORAL;  Surgeon: Elam Dutch, MD;  Location: [facility];  Service: Vascular;  Laterality: Right;  . ENDARTERECTOMY FEMORAL Left 01/24/2020   Procedure: LEFT FEMORAL ENDARTERECTOMY WITH DACRON PATCH  ANGIOPLASTY;  Surgeon: Elam Dutch, MD;  Location: [facility];  Service: Vascular;  Laterality: Left;  . FEMORAL ENDARTERECTOMY Left 01/24/2020  . INSERTION OF ILIAC STENT Left 01/24/2020   Procedure: INSERTION OF VBX STENT 8X59 AND 8X39 LEFT COMMON ILIAC ARTERY. INSERTION OF INNOVA 7 X 60 INNOVA STENT LEFT EXTERNAL ILIAC ARTERY. ;  Surgeon: Elam Dutch, MD;  Location: [facility];  Service: Vascular;  Laterality: Left;  . LOWER EXTREMITY ANGIOGRAPHY  12/11/2018  . LOWER EXTREMITY ANGIOGRAPHY N/A 12/11/2018   Procedure: LOWER EXTREMITY ANGIOGRAPHY;  Surgeon: Elam Dutch, MD;  Location: [facility];  Service: Cardiovascular;  Laterality: N/A;  . PATCH ANGIOPLASTY Right 04/19/2019   Procedure: Patch Angioplasty;  Surgeon: Elam Dutch, MD;  Location: [facility];  Service: Vascular;  Laterality: Right;  . PERIPHERAL VASCULAR INTERVENTION Right 12/11/2018   Procedure: PERIPHERAL VASCULAR INTERVENTION;  Surgeon: Elam Dutch, MD;  Location: [facility];  Service: Cardiovascular;  Laterality: Right;  Common Iliac   . SKIN CANCER EXCISION     "lips, face, ears, arms" (04/07/2018)  . TONSILLECTOMY    . ULTRASOUND GUIDANCE FOR VASCULAR ACCESS Right 01/24/2020   Procedure: ULTRASOUND GUIDANCE FOR VASCULAR ACCESS;  Surgeon: Elam Dutch, MD;  Location: [facility];  Service: Vascular;  Laterality: Right;       Family History  Problem Relation Age of Onset  . Heart disease Mother        CABG in her 22s, nonsmoker  . Cancer Mother   . Stroke Father   . Heart disease Father        Died of MI at age 40, smoker  . Hepatitis Sister   . Coronary artery disease Other        male 1st degree relative <60    Social History   Tobacco Use  . Smoking status: Former Smoker    Packs/day: 2.00    Years: 52.00    Pack years: 104.00    Types: Pipe, Cigarettes    Start date: 10/06/1965    Quit date: 10/06/2017    Years since quitting: 2.3  . Smokeless tobacco: Never Used  . Tobacco  comment: 04/07/2018 "smoked cigarettes years ago; stopped ~ 20 yr ago; stopped smoking pipe in 09/2017"  Vaping Use  . Vaping Use: Never used  Substance Use Topics  . Alcohol use: Yes    Alcohol/week: 28.0 standard drinks    Types: 28 Cans of beer per week    Comment: "04/15/2019 "4 beers/day; probably closer to 28/wk"  . Drug use: No    Home Medications Prior to Admission medications   Medication Sig Start Date End Date Taking? Authorizing Provider  acetaminophen (TYLENOL) 500 MG tablet Take 1,000 mg by mouth every 6 (six) hours as needed for moderate pain.    Yes [provider]  albuterol (VENTOLIN HFA) 108 (90 Base) MCG/ACT inhaler USE 2 PUFFS EVERY 6 HOURS  AS NEEDED FOR WHEEZING Patient taking differently: Inhale 2 puffs into the lungs every 6 (six) hours as needed for wheezing or shortness of breath.  07/30/19  Yes Martyn Ehrich, NP  aspirin EC  81 MG EC tablet Take 1 tablet (81 mg total) by mouth daily. Swallow whole. 01/31/20  Yes Dagoberto Ligas, PA-C  azithromycin (ZITHROMAX) 250 MG tablet Take 1 tablet (250 mg total) by mouth every Monday, Wednesday, and Friday. 04/30/19  Yes Lauraine Rinne, NP  budesonide-formoterol (SYMBICORT) 160-4.5 MCG/ACT inhaler Inhale 2 puffs into the lungs 2 (two) times daily.   Yes [provider]  Calcium Carbonate-Vitamin D (CALCIUM 600+D PO) Take 2 tablets by mouth daily.   Yes [provider]  cycloSPORINE (RESTASIS) 0.05 % ophthalmic emulsion Place 1 drop into both eyes 2 (two) times daily as needed (dry eyes).    Yes [provider]  fluticasone (FLONASE) 50 MCG/ACT nasal spray Place 2 sprays into both nostrils daily. 11/29/19  Yes [name], MD  furosemide (LASIX) 20 MG tablet TAKE 1 TABLET BY MOUTH  DAILY Patient taking differently: Take 20 mg by mouth daily.  11/29/19  Yes [name], MD  OXYGEN Inhale 4-5 L into the lungs continuous.    Yes [provider]  predniSONE (DELTASONE) 10 MG  tablet Take 1 tablet (10 mg total) by mouth daily with breakfast. 09/21/19  Yes Lauraine Rinne, NP  rivaroxaban (XARELTO) 20 MG TABS tablet TAKE 1 TABLET BY MOUTH  DAILY Patient taking differently: Take 20 mg by mouth daily.  06/02/19  Yes [name], MD  roflumilast (DALIRESP) 500 MCG TABS tablet Take 1 tablet (500 mcg total) by mouth daily. 08/02/19  Yes Brand Males, MD  sodium chloride HYPERTONIC 3 % nebulizer solution Take by nebulization daily. Patient taking differently: Take 4 mLs by nebulization daily as needed (to loosen phlegm).  11/11/19  Yes Brand Males, MD  SPIRIVA RESPIMAT 2.5 MCG/ACT AERS USE 2 INHALATIONS BY MOUTH  ONCE DAILY Patient taking differently: Take 2 puffs by mouth daily.  03/03/19  Yes Brand Males, MD  traMADol (ULTRAM) 50 MG tablet Take 1 tablet (50 mg total) by mouth every 12 (twelve) hours as needed for moderate pain or severe pain. Patient taking differently: Take 50 mg by mouth See admin instructions. Take one tablet (50 mg) by mouth daily at bedtime, may also take one tablet (50 mg) 12 hours later if still needed for pain 01/31/20  Yes Dagoberto Ligas, PA-C  Respiratory Therapy Supplies (FLUTTER) DEVI 10 times Twice a day and prn as needed, may increase if feeling worse 10/15/19   Lauraine Rinne, NP    Allergies    Tape, Ciprofloxacin, and Levaquin [levofloxacin]  Review of Systems   Review of Systems  Constitutional: Negative for appetite change, chills and fever.  HENT: Negative.   Respiratory: Positive for cough and shortness of breath.   Cardiovascular: Negative for chest pain.  Gastrointestinal: Negative for abdominal pain, anal bleeding, constipation, diarrhea, nausea and vomiting.  Genitourinary: Negative for dysuria.  Musculoskeletal: Negative for arthralgias and back pain.  Skin: Negative for rash.  Neurological: Negative for dizziness and light-headedness.  Hematological: Bruises/bleeds easily.    Physical Exam Updated Vital  Signs BP 125/85   Pulse (!) 110   Temp 98.2 F (36.8 C) (Oral)   Resp 20   SpO2 100%   Physical Exam Vitals and nursing note reviewed.  Constitutional:      Appearance: Normal appearance.  HENT:     Head: Normocephalic.     Mouth/Throat:     Mouth: Mucous membranes are moist.  Eyes:     Comments: Pale conjuntiva  Cardiovascular:     Rate  and Rhythm: Tachycardia present.  Pulmonary:     Effort: No respiratory distress.     Breath sounds: No stridor. Wheezing and rhonchi present.  Abdominal:     General: Abdomen is flat. There is no distension.     Palpations: Abdomen is soft.     Tenderness: There is no abdominal tenderness.  Genitourinary:    Comments: Stool is dark brown/black Musculoskeletal:     Right lower leg: Edema present.     Left lower leg: Edema present.  Skin:    General: Skin is dry.     Coloration: Skin is pale.     Comments: Left side necrotic toes  Neurological:     Mental Status: He is alert.  Psychiatric:        Mood and Affect: Mood normal.     ED Results / Procedures / Treatments   Labs (all labs ordered are listed, but only abnormal results are displayed) Labs Reviewed  COMPREHENSIVE METABOLIC PANEL - Abnormal; Notable for the following components:      Result Value   Glucose, Bld 100 (*)    Total Protein 5.7 (*)    Albumin 2.8 (*)    GFR calc non Af Amer 59 (*)    All other components within normal limits  CBC WITH DIFFERENTIAL/PLATELET - Abnormal; Notable for the following components:   WBC 11.6 (*)    RBC 1.95 (*)    Hemoglobin 5.0 (*)    HCT 18.1 (*)    MCH 25.6 (*)    MCHC 27.6 (*)    RDW 17.1 (*)    Neutro Abs 9.7 (*)    Abs Immature Granulocytes 0.22 (*)    All other components within normal limits  PROTIME-INR - Abnormal; Notable for the following components:   Prothrombin Time 25.1 (*)    INR 2.4 (*)    All other components within normal limits  POC OCCULT BLOOD, ED - Abnormal; Notable for the following components:    Fecal Occult Bld POSITIVE (*)    All other components within normal limits  SARS CORONAVIRUS 2 BY RT PCR (HOSPITAL ORDER, [facility])  LACTIC ACID, PLASMA  LACTIC ACID, PLASMA  POC OCCULT BLOOD, ED  TYPE AND SCREEN  PREPARE RBC (CROSSMATCH)    EKG EKG Interpretation  Date/Time:  Friday February 18 2020 16:31:01 EDT Ventricular Rate:  117 PR Interval:    QRS Duration: 90 QT Interval:  329 QTC Calculation: 459 R Axis:   43 Text Interpretation: Sinus tachycardia Multiple premature complexes, vent & supraven Aberrant conduction of SV complex(es) Low voltage, precordial leads Abnormal R-wave progression, early transition Baseline wander in lead(s) V6 Confirmed by Malvin Johns [phone]) on 02/18/2020 4:59:57 PM   Radiology DG Chest Port 1 View  Result Date: 02/18/2020 CLINICAL DATA:  74 year old male with shortness of breath. EXAM: PORTABLE CHEST 1 VIEW COMPARISON:  Chest radiograph dated 01/27/2020. FINDINGS: There is a small right pleural effusion. Diffuse right lung interstitial coarsening and opacities most consistent with infiltrate and relative the unchanged. Areas of subpleural scarring noted in the right apex and right lung base with overall decreased volume loss in the right lung. There is background of emphysema. No pneumothorax. The cardiac silhouette is within limits. No acute osseous pathology. IMPRESSION: Small right pleural effusion and right lung infiltrate similar to prior radiograph. Electronically Signed   By: Anner Crete M.D.   On: 02/18/2020 17:07    Procedures Procedures (including critical care time)  Medications  Ordered in ED Medications  sodium chloride 0.9 % bolus 500 mL (500 mLs Intravenous New Bag/Given 02/18/20 1819)  pantoprazole (PROTONIX) 80 mg in sodium chloride 0.9 % 100 mL IVPB (has no administration in time range)  0.9 %  sodium chloride infusion (10 mL/hr Intravenous New Bag/Given 02/18/20 1820)    ED Course  I have  reviewed the triage vital signs and the nursing notes.  Pertinent labs & imaging results that were available during my care of the patient were reviewed by me and considered in my medical decision making (see chart for details).  Clinical Course as of Feb 18 1916  Fri Feb 18, 2020  1838 Patient presenting with symptomatic anemia. Hemoglobin 5.0. Hemmocult positive. Getting transfused in ED. Stool is brown. Las saw GI 10 years ago for colonoscopy, Dr. Earlean Shawl who is not in our system. Hemodynamically stable currently. On xarelto. Dr. Alessandra Bevels from [facility] was made aware through epic message of this patient to consult on him tomorrow. Chest xray stable from previous and patient is stable on his normal 4L of oxygen. Discussed with Dr. Tamera Punt and plan agreed upon. Will admit to medicine.    [KM]  1916 Patient to be admitted by hospitalist for further workup/treatment of GI bleed. Currently getting 2 units PRBC, 1/2 NS bolus and 80mg  protonix   [KM]    Clinical Course User Index [KM] Kristine Royal   MDM Rules/Calculators/A&P                          CRITICAL CARE Performed by: Alveria Apley   Total critical care time: 35 minutes  Critical care time was exclusive of separately billable procedures and treating other patients.  Critical care was necessary to treat or prevent imminent or life-threatening deterioration.  Critical care was time spent personally by me on the following activities: development of treatment plan with patient and/or surrogate as well as nursing, discussions with consultants, evaluation of patient's response to treatment, examination of patient, obtaining history from patient or surrogate, ordering and performing treatments and interventions, ordering and review of laboratory studies, ordering and review of radiographic studies, pulse oximetry and re-evaluation of patient's condition.   Final Clinical Impression(s) / ED Diagnoses Final diagnoses:  SOB  (shortness of breath)  Symptomatic anemia  Acute GI bleeding    Rx / DC Orders ED Discharge Orders    None       Kristine Royal 02/18/20 1917    Malvin Johns, MD 02/18/20 2150

## 2020-02-18 NOTE — ED Notes (Signed)
Second blue top sent to lab

## 2020-02-18 NOTE — Telephone Encounter (Signed)
Agree with the ER disposition

## 2020-02-18 NOTE — Telephone Encounter (Addendum)
Nurse with [facility] health called saying patient has a Hgb of 5.1 and Hct of 16.6.  He is weak and having SOB.  She is sending him to the ER.  [name] paged and will let patient's PCP know the results.    Thurston Hole., LPN

## 2020-02-19 ENCOUNTER — Other Ambulatory Visit: Payer: Self-pay

## 2020-02-19 DIAGNOSIS — Z7952 Long term (current) use of systemic steroids: Secondary | ICD-10-CM

## 2020-02-19 DIAGNOSIS — K922 Gastrointestinal hemorrhage, unspecified: Secondary | ICD-10-CM

## 2020-02-19 DIAGNOSIS — K921 Melena: Secondary | ICD-10-CM

## 2020-02-19 DIAGNOSIS — Z9981 Dependence on supplemental oxygen: Secondary | ICD-10-CM

## 2020-02-19 DIAGNOSIS — R195 Other fecal abnormalities: Secondary | ICD-10-CM

## 2020-02-19 DIAGNOSIS — Z7901 Long term (current) use of anticoagulants: Secondary | ICD-10-CM

## 2020-02-19 DIAGNOSIS — Z791 Long term (current) use of non-steroidal anti-inflammatories (NSAID): Secondary | ICD-10-CM

## 2020-02-19 LAB — COMPREHENSIVE METABOLIC PANEL
ALT: 15 U/L (ref 0–44)
AST: 15 U/L (ref 15–41)
Albumin: 2.4 g/dL — ABNORMAL LOW (ref 3.5–5.0)
Alkaline Phosphatase: 56 U/L (ref 38–126)
Anion gap: 11 (ref 5–15)
BUN: 20 mg/dL (ref 8–23)
CO2: 27 mmol/L (ref 22–32)
Calcium: 8.2 mg/dL — ABNORMAL LOW (ref 8.9–10.3)
Chloride: 101 mmol/L (ref 98–111)
Creatinine, Ser: 1.03 mg/dL (ref 0.61–1.24)
GFR calc Af Amer: >60 mL/min (ref >60)
GFR calc non Af Amer: >60 mL/min (ref >60)
Glucose, Bld: 82 mg/dL (ref 70–99)
Potassium: 3.6 mmol/L (ref 3.5–5.1)
Sodium: 139 mmol/L (ref 135–145)
Total Bilirubin: 0.8 mg/dL (ref 0.3–1.2)
Total Protein: 4.8 g/dL — ABNORMAL LOW (ref 6.5–8.1)

## 2020-02-19 LAB — TYPE AND SCREEN
ABO/RH(D): A POS
Antibody Screen: NEGATIVE
Unit division: 0
Unit division: 0

## 2020-02-19 LAB — CBC
HCT: 24 % — ABNORMAL LOW (ref 39.0–52.0)
HCT: 25.7 % — ABNORMAL LOW (ref 39.0–52.0)
Hemoglobin: 7.7 g/dL — ABNORMAL LOW (ref 13.0–17.0)
Hemoglobin: 8.2 g/dL — ABNORMAL LOW (ref 13.0–17.0)
MCH: 28.1 pg (ref 26.0–34.0)
MCH: 27.4 pg (ref 26.0–34.0)
MCHC: 32.1 g/dL (ref 30.0–36.0)
MCHC: 31.9 g/dL (ref 30.0–36.0)
MCV: 87.6 fL (ref 80.0–100.0)
MCV: 86 fL (ref 80.0–100.0)
Platelets: 251 10*3/uL (ref 150–400)
Platelets: 266 10*3/uL (ref 150–400)
RBC: 2.74 MIL/uL — ABNORMAL LOW (ref 4.22–5.81)
RBC: 2.99 MIL/uL — ABNORMAL LOW (ref 4.22–5.81)
RDW: 15.9 % — ABNORMAL HIGH (ref 11.5–15.5)
RDW: 15.7 % — ABNORMAL HIGH (ref 11.5–15.5)
WBC: 8.1 10*3/uL (ref 4.0–10.5)
WBC: 10.4 10*3/uL (ref 4.0–10.5)
nRBC: 0 % (ref 0.0–0.2)
nRBC: 0 % (ref 0.0–0.2)

## 2020-02-19 LAB — BPAM RBC
Blood Product Expiration Date: 202109052359
Blood Product Expiration Date: 202109052359
ISSUE DATE / TIME: 202108131800
ISSUE DATE / TIME: 202108132300
Unit Type and Rh: 6200
Unit Type and Rh: 6200

## 2020-02-19 LAB — APTT: aPTT: 32 seconds (ref 24–36)

## 2020-02-19 LAB — MAGNESIUM: Magnesium: 1.8 mg/dL (ref 1.7–2.4)

## 2020-02-19 LAB — EXPECTORATED SPUTUM ASSESSMENT W GRAM STAIN, RFLX TO RESP C

## 2020-02-19 LAB — C-REACTIVE PROTEIN: CRP: 0.9 mg/dL (ref <1.0)

## 2020-02-19 LAB — HEMOGLOBIN AND HEMATOCRIT, BLOOD
HCT: 24.9 % — ABNORMAL LOW (ref 39.0–52.0)
Hemoglobin: 8.1 g/dL — ABNORMAL LOW (ref 13.0–17.0)

## 2020-02-19 LAB — PROTIME-INR
INR: 1.7 — ABNORMAL HIGH (ref 0.8–1.2)
Prothrombin Time: 18.9 seconds — ABNORMAL HIGH (ref 11.4–15.2)

## 2020-02-19 LAB — PROCALCITONIN: Procalcitonin: 0.13 ng/mL

## 2020-02-19 LAB — LACTIC ACID, PLASMA: Lactic Acid, Venous: 0.6 mmol/L (ref 0.5–1.9)

## 2020-02-19 MED ORDER — SODIUM CHLORIDE 3 % IN NEBU
4.0000 mL | INHALATION_SOLUTION | Freq: Two times a day (BID) | RESPIRATORY_TRACT | Status: AC
  Administered 2020-02-19 – 2020-02-22 (×5): 4 mL via RESPIRATORY_TRACT
  Filled 2020-02-19 (×6): qty 4

## 2020-02-19 MED ORDER — IPRATROPIUM-ALBUTEROL 0.5-2.5 (3) MG/3ML IN SOLN
3.0000 mL | Freq: Four times a day (QID) | RESPIRATORY_TRACT | Status: DC
  Administered 2020-02-19 – 2020-02-21 (×6): 3 mL via RESPIRATORY_TRACT
  Filled 2020-02-19 (×7): qty 3

## 2020-02-19 MED ORDER — SODIUM CHLORIDE 3 % IN NEBU
4.0000 mL | INHALATION_SOLUTION | Freq: Two times a day (BID) | RESPIRATORY_TRACT | Status: DC
  Filled 2020-02-19: qty 4

## 2020-02-19 MED ORDER — GUAIFENESIN ER 600 MG PO TB12
1200.0000 mg | ORAL_TABLET | Freq: Two times a day (BID) | ORAL | Status: DC
  Administered 2020-02-19 – 2020-02-25 (×13): 1200 mg via ORAL
  Filled 2020-02-19 (×14): qty 2

## 2020-02-19 NOTE — Consult Note (Addendum)
Referring Provider:  [facility]         Primary Care Physician:  [name], MD Primary Gastroenterologist: Althia Forts            We were asked to see this patient for:   Anemia and dark stool             ASSESSMENT /  PLAN    CC: dark stool   Francisco [name] is a 74 y.o. male PMH significant for, but not necessarily limited to,  COPD on home 02, severe PVD, diastolic heart failure, HTN, history of PE on anticoagulant, history of colon polyps.                                                                                                                                    # Acute on chronic [place] anemia with dark stools on ASA, prednisone, and Xarelto  --Hgb down from 9.5 on 7/26 to 5.1.  --Hgb improved to 8.2 post 2 units of blood.  --Starting having very dark stools just after hospital discharge late July ( when ASA added to Xarelto). Rule out erosive disease / PUD / AMVs, neoplasm.  --Patient needs an EGD. He is at increased risk for sedation given advanced COPD. The risks and benefits of EGD were discussed and the patient agrees to proceed. Will probably proceed tomorrow. --Clear liquids, NPO after MN --BID PPI  # Hx of adenomatous colon polyps Sept 2011 and 3 years prior.  --Follow up colonoscopy never done, apparently secondary to pulmonary disease.  --He may be too high risk for polyps surveillance program but it EGD is negative we may have to consider colonoscopy, especially given his need for anticoagulation.   # Hx of PE, on chronic Xarelto --Last dose yesterday am.   # COPD, 02 dependent.   # Lung infiltrate on CT scan --Admitting team doubts infectious. Workup in progress. Pulmonary and ID were consulted.   HPI:    Chief Complaint: anemia.   Francisco [name] is a 74 y.o. male with multiple medical problems as listed below. He was sent by Vascular Surgery to hospital yesterday for anemia. Patient was recently hospitalized for LLE ischemia, s/p left  femoral endarterectomy on 7/19. Following the procedure his hemoglobin declined from 9.2 to 7.9. He was felt to have post-op anemia, was transfused 2 u blood on 7/19. Hemoglobin around time of discharge was 9.5.  Xarelto was resumed and ASA added at time of discharge.   Patient's hgb at Vascular Surgery's office yesterday was 5. Family Medicine admitted patient today, transfused 2 units of blood, started IV PPI and placed ASA / Xarelto on hold. His hgb is 8.2 today.   Patient's wife noticed the dark stools shortly after hospital discharge. He doesn't take iron or bismuth. No change in consistency / frequency of stools. Other than daily baby ASA he doesn't take NSAIDS. Not on  acid blockers. No history of PUD or GERD. He denies abdominal pain and no N/V. Appetite is fine. At home  patient became progressively weak, breathing got worse.    No [place] of colon cancer or gastric cancer.    PREVIOUS ENDOSCOPIC EVALUATIONS / GI STUDIES :  Colonoscopy 2011 ( polyp surveillance for polyp 3 years earlier). Dr. Earlean Shawl -3 polyps removed, all ~ 8 mm -Hemorrhoids.   Path c/w tubular adenoma  Past Medical History:  Diagnosis Date  . Chronic back pain    "mid and lower" (04/07/2018)  . Chronic rhinitis    -Sinus Ct 08/01/2009 >> Bilateral maxillary sinusitis with some mucosal thickeningin the sphenoid and frontal sinuses as well with air fluid levels present -chronic rhinitis flyer Aug 04, 2009  . Compressed spine fracture ([place])   . COPD (chronic obstructive pulmonary disease) [facility])    PFT's rec Jul 17, 2009  . Emphysema lung ([place])   . On home oxygen therapy    "3L; 24/7" (04/07/2018)  . Onychomycosis    Dr. Judi Cong  . Orthostatic hypotension    "since 10/2017" (04/07/2018)  . PAD (peripheral artery disease) ([place])   . Pneumonia    "twice in 1 year" (04/07/2018)  . Pulmonary embolism ([place]) 04/07/2018  . Skin cancer    "lips, face, ears, arms" (04/07/2018)  . Vertigo    "since ~ 02/2018" (04/07/2018)     Past Surgical History:  Procedure Laterality Date  . ABDOMINAL AORTOGRAM W/LOWER EXTREMITY N/A 01/21/2020   Procedure: ABDOMINAL AORTOGRAM W/LOWER EXTREMITY;  Surgeon: Elam Dutch, MD;  Location: [place] [facility];  Service: Cardiovascular;  Laterality: N/A;  . APPENDECTOMY    . CATARACT EXTRACTION, BILATERAL    . ENDARTERECTOMY FEMORAL Right 04/19/2019   Procedure: ENDARTERECTOMY RIGHT COMMON FEMORAL;  Surgeon: Elam Dutch, MD;  Location: [place];  Service: Vascular;  Laterality: Right;  . ENDARTERECTOMY FEMORAL Left 01/24/2020   Procedure: LEFT FEMORAL ENDARTERECTOMY WITH DACRON PATCH ANGIOPLASTY;  Surgeon: Elam Dutch, MD;  Location: [place];  Service: Vascular;  Laterality: Left;  . FEMORAL ENDARTERECTOMY Left 01/24/2020  . INSERTION OF ILIAC STENT Left 01/24/2020   Procedure: INSERTION OF VBX STENT 8X59 AND 8X39 LEFT COMMON ILIAC ARTERY. INSERTION OF INNOVA 7 X 60 INNOVA STENT LEFT EXTERNAL ILIAC ARTERY. ;  Surgeon: Elam Dutch, MD;  Location: [place];  Service: Vascular;  Laterality: Left;  . LOWER EXTREMITY ANGIOGRAPHY  12/11/2018  . LOWER EXTREMITY ANGIOGRAPHY N/A 12/11/2018   Procedure: LOWER EXTREMITY ANGIOGRAPHY;  Surgeon: Elam Dutch, MD;  Location: [place] [facility];  Service: Cardiovascular;  Laterality: N/A;  . PATCH ANGIOPLASTY Right 04/19/2019   Procedure: Patch Angioplasty;  Surgeon: Elam Dutch, MD;  Location: [place];  Service: Vascular;  Laterality: Right;  . PERIPHERAL VASCULAR INTERVENTION Right 12/11/2018   Procedure: PERIPHERAL VASCULAR INTERVENTION;  Surgeon: Elam Dutch, MD;  Location: [place] [facility];  Service: Cardiovascular;  Laterality: Right;  Common Iliac   . SKIN CANCER EXCISION     "lips, face, ears, arms" (04/07/2018)  . TONSILLECTOMY    . ULTRASOUND GUIDANCE FOR VASCULAR ACCESS Right 01/24/2020   Procedure: ULTRASOUND GUIDANCE FOR VASCULAR ACCESS;  Surgeon: Elam Dutch, MD;  Location: [facility] OR;  Service: Vascular;   Laterality: Right;    Prior to Admission medications   Medication Sig Start Date End Date Taking? Authorizing Provider  acetaminophen (TYLENOL) 500 MG tablet Take 1,000 mg by mouth every 6 (six) hours as needed for  moderate pain.    Yes [provider]  albuterol (VENTOLIN HFA) 108 (90 Base) MCG/ACT inhaler USE 2 PUFFS EVERY 6 HOURS  AS NEEDED FOR WHEEZING Patient taking differently: Inhale 2 puffs into the lungs every 6 (six) hours as needed for wheezing or shortness of breath.  07/30/19  Yes Martyn Ehrich, NP  aspirin EC 81 MG EC tablet Take 1 tablet (81 mg total) by mouth daily. Swallow whole. 01/31/20  Yes Dagoberto Ligas, PA-C  azithromycin (ZITHROMAX) 250 MG tablet Take 1 tablet (250 mg total) by mouth every Monday, Wednesday, and Friday. 04/30/19  Yes Lauraine Rinne, NP  budesonide-formoterol (SYMBICORT) 160-4.5 MCG/ACT inhaler Inhale 2 puffs into the lungs 2 (two) times daily.   Yes [provider]  Calcium Carbonate-Vitamin D (CALCIUM 600+D PO) Take 2 tablets by mouth daily.   Yes [provider]  cycloSPORINE (RESTASIS) 0.05 % ophthalmic emulsion Place 1 drop into both eyes 2 (two) times daily as needed (dry eyes).    Yes [provider]  fluticasone (FLONASE) 50 MCG/ACT nasal spray Place 2 sprays into both nostrils daily. 11/29/19  Yes [name], MD  furosemide (LASIX) 20 MG tablet TAKE 1 TABLET BY MOUTH  DAILY Patient taking differently: Take 20 mg by mouth daily.  11/29/19  Yes [name], MD  OXYGEN Inhale 4-5 L into the lungs continuous.    Yes [provider]  predniSONE (DELTASONE) 10 MG tablet Take 1 tablet (10 mg total) by mouth daily with breakfast. 09/21/19  Yes Lauraine Rinne, NP  rivaroxaban (XARELTO) 20 MG TABS tablet TAKE 1 TABLET BY MOUTH  DAILY Patient taking differently: Take 20 mg by mouth daily.  06/02/19  Yes [name], MD  roflumilast (DALIRESP) 500 MCG TABS tablet Take 1 tablet (500 mcg total) by  mouth daily. 08/02/19  Yes Brand Males, MD  sodium chloride HYPERTONIC 3 % nebulizer solution Take by nebulization daily. Patient taking differently: Take 4 mLs by nebulization daily as needed (to loosen phlegm).  11/11/19  Yes Brand Males, MD  SPIRIVA RESPIMAT 2.5 MCG/ACT AERS USE 2 INHALATIONS BY MOUTH  ONCE DAILY Patient taking differently: Take 2 puffs by mouth daily.  03/03/19  Yes Brand Males, MD  traMADol (ULTRAM) 50 MG tablet Take 1 tablet (50 mg total) by mouth every 12 (twelve) hours as needed for moderate pain or severe pain. Patient taking differently: Take 50 mg by mouth See admin instructions. Take one tablet (50 mg) by mouth daily at bedtime, may also take one tablet (50 mg) 12 hours later if still needed for pain 01/31/20  Yes Dagoberto Ligas, PA-C  Respiratory Therapy Supplies (FLUTTER) DEVI 10 times Twice a day and prn as needed, may increase if feeling worse 10/15/19   Lauraine Rinne, NP    Current Facility-Administered Medications  Medication Dose Route Frequency Provider Last Rate Last Admin  . acetaminophen (TYLENOL) tablet 650 mg  650 mg Oral Q6H PRN Shalhoub, Sherryll Burger, MD       Or  . acetaminophen (TYLENOL) suppository 650 mg  650 mg Rectal Q6H PRN Shalhoub, Sherryll Burger, MD      . Derrill Memo ON 02/21/2020] azithromycin (ZITHROMAX) tablet 250 mg  250 mg Oral Q M,W,F Shalhoub, Sherryll Burger, MD      . cycloSPORINE (RESTASIS) 0.05 % ophthalmic emulsion 1 drop  1 drop Both Eyes BID PRN Shalhoub, Sherryll Burger, MD      . fluticasone (FLONASE) 50 MCG/ACT nasal spray 2 spray  2 spray Each Nare Daily Shalhoub, Sherryll Burger, MD      . fluticasone furoate-vilanterol (BREO ELLIPTA) 200-25 MCG/INH 1 puff  1 puff Inhalation Daily Shalhoub, Sherryll Burger, MD      . folic acid (FOLVITE) tablet 1 mg  1 mg Oral Daily Shalhoub, Sherryll Burger, MD   1 mg at 02/19/20 0906  . furosemide (LASIX) tablet 20 mg  20 mg Oral Daily Shalhoub, Sherryll Burger, MD   20 mg at 02/19/20 0906  . ipratropium-albuterol (DUONEB) 0.5-2.5  (3) MG/3ML nebulizer solution 3 mL  3 mL Nebulization Q4H PRN Shalhoub, Sherryll Burger, MD      . LORazepam (ATIVAN) tablet 1-4 mg  1-4 mg Oral Q1H PRN Shalhoub, Sherryll Burger, MD       Or  . LORazepam (ATIVAN) injection 1-4 mg  1-4 mg Intravenous Q1H PRN Shalhoub, Sherryll Burger, MD      . multivitamin with minerals tablet 1 tablet  1 tablet Oral Daily Shalhoub, Sherryll Burger, MD   1 tablet at 02/19/20 0906  . ondansetron (ZOFRAN) tablet 4 mg  4 mg Oral Q6H PRN Shalhoub, Sherryll Burger, MD       Or  . ondansetron [facility]) injection 4 mg  4 mg Intravenous Q6H PRN Shalhoub, Sherryll Burger, MD      . pantoprazole (PROTONIX) injection 40 mg  40 mg Intravenous Q12H Shalhoub, Sherryll Burger, MD   40 mg at 02/19/20 0906  . predniSONE (DELTASONE) tablet 10 mg  10 mg Oral Q breakfast Shalhoub, Sherryll Burger, MD   10 mg at 02/19/20 0906  . roflumilast (DALIRESP) tablet 500 mcg  500 mcg Oral Daily Shalhoub, Sherryll Burger, MD   500 mcg at 02/19/20 0907  . sodium chloride HYPERTONIC 3 % nebulizer solution 4 mL  4 mL Nebulization Daily PRN Shalhoub, Sherryll Burger, MD      . thiamine tablet 100 mg  100 mg Oral Daily Shalhoub, Sherryll Burger, MD   100 mg at 02/19/20 7989   Or  . thiamine (B-1) injection 100 mg  100 mg Intravenous Daily Shalhoub, Sherryll Burger, MD      . traMADol Veatrice [place]bon) tablet 50 mg  50 mg Oral Q6H PRN Shalhoub, Sherryll Burger, MD      . umeclidinium bromide (INCRUSE ELLIPTA) 62.5 MCG/INH 1 puff  1 puff Inhalation Daily Shalhoub, Sherryll Burger, MD        Allergies as of 02/18/2020 - Review Complete 02/18/2020  Allergen Reaction Noted  . Tape Other (See Comments) 11/02/2017  . Ciprofloxacin Nausea Only 11/26/2019  . Levaquin [levofloxacin] Other (See Comments) 07/14/2018    Family History  Problem Relation Age of Onset  . Heart disease Mother        CABG in her 30s, nonsmoker  . Cancer Mother   . Stroke Father   . Heart disease Father        Died of MI at age 48, smoker  . Hepatitis Sister   . Coronary artery disease Other        male 1st degree  relative <60    Social History   Socioeconomic History  . Marital status: Married    Spouse name: Not on file  . Number of children: 2  . Years of education: 3 years of college  . Highest education level: Not on file  Occupational History    Employer: [employer]  Tobacco Use  . Smoking status: Former Smoker    Packs/day: 2.00    Years: 52.00  Pack years: 104.00    Types: Pipe, Cigarettes    Start date: 10/06/1965    Quit date: 10/06/2017    Years since quitting: 2.3  . Smokeless tobacco: Never Used  . Tobacco comment: 04/07/2018 "smoked cigarettes years ago; stopped ~ 20 yr ago; stopped smoking pipe in 09/2017"  Vaping Use  . Vaping Use: Never used  Substance and Sexual Activity  . Alcohol use: Yes    Alcohol/week: 28.0 standard drinks    Types: 28 Cans of beer per week    Comment: "04/15/2019 "4 beers/day; probably closer to 28/wk"  . Drug use: No  . Sexual activity: Not Currently  Other Topics Concern  . Not on file  Social History Narrative   Married (wife Katy Apo patient of Dr. Yong Channel) with children (2-son and daughter). 1 grandson.       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       Hobbies: fishing, projects at home   Social Determinants of Health   Financial Resource Strain:   . Difficulty of Paying Living Expenses:   Food Insecurity:   . Worried About Charity fundraiser in the Last Year:   . Arboriculturist in the Last Year:   Transportation Needs:   . Film/video editor (Medical):   [name] Kitchen Lack of Transportation (Non-Medical):   Physical Activity:   . Days of Exercise per Week:   . Minutes of Exercise per Session:   Stress:   . Feeling of Stress :   Social Connections:   . Frequency of Communication with Friends and Family:   . Frequency of Social Gatherings with Friends and Family:   . Attends Religious Services:   . Active Member of Clubs or Organizations:   . Attends Archivist Meetings:   [name] Kitchen Marital Status:     Intimate Partner Violence:   . Fear of Current or Ex-Partner:   . Emotionally Abused:   [name] Kitchen Physically Abused:   . Sexually Abused:     Review of Systems: All systems reviewed and negative except where noted in HPI.  Physical Exam: Vital signs in last 24 hours: Temp:  [97.5 F (36.4 C)-98.6 F (37 C)] 98.5 F (36.9 C) (08/14 1117) Pulse Rate:  [99-117] 110 (08/14 1117) Resp:  [17-20] 18 (08/14 1117) BP: (105-146)/(50-85) 112/67 (08/14 1117) SpO2:  [95 %-100 %] 98 % (08/14 1117) Weight:  [73.5 kg-73.9 kg] 73.9 kg (08/14 0248) Last BM Date: 02/18/20 General:   Alert, well-developed,  male in NAD. Wife Lilly in room.  Psych:  Pleasant, cooperative. Normal mood and affect. Eyes:  Pupils equal, sclera clear, no icterus.   Conjunctiva pink. Ears:  Normal auditory acuity. Nose:  No deformity, discharge,  or lesions. Neck:  Supple; no masses Lungs: On 02 per [place]. Decreased breath sounds bilaterally.  No wheezes  Heart:  Regular rate and rhythm; no lower extremity edema Abdomen:  Soft, non-distended, nontender, BS active, no palp mass   Rectal:  Deferred  Msk:  Symmetrical without gross deformities. . Neurologic:  Alert and  oriented x4;  grossly normal neurologically. Skin:  Intact without significant lesions or rashes.   Intake/Output from previous day: 08/13 0701 - 08/14 0700 In: 870 [P.O.:240; Blood:630] Out: 525 [Urine:525] Intake/Output this shift: Total I/O In: -  Out: 725 [Urine:725]  Lab Results: Recent Labs    02/18/20 1645 02/19/20 0447 02/19/20 1142  WBC 11.6* 8.1 10.4  HGB 5.0* 7.7* 8.2*  HCT  18.1* 24.0* 25.7*  PLT 339 251 266   BMET Recent Labs    02/18/20 1645 02/19/20 0452  NA 138 139  K 4.8 3.6  CL 100 101  CO2 25 27  GLUCOSE 100* 82  BUN 23 20  CREATININE 1.22 1.03  CALCIUM 9.2 8.2*   LFT Recent Labs    02/19/20 0452  PROT 4.8*  ALBUMIN 2.4*  AST 15  ALT 15  ALKPHOS 56  BILITOT 0.8   PT/INR Recent Labs    02/18/20 1815  02/19/20 0452  LABPROT 25.1* 18.9*  INR 2.4* 1.7*   Hepatitis Panel No results for input(s): HEPBSAG, HCVAB, HEPAIGM, HEPBIGM in the last 72 hours.   . CBC Latest Ref Rng & Units 02/19/2020 02/19/2020 02/18/2020  WBC 4.0 - 10.5 K/uL 10.4 8.1 11.6(H)  Hemoglobin 13.0 - 17.0 g/dL 8.2(L) 7.7(L) 5.0(LL)  Hematocrit 39 - 52 % 25.7(L) 24.0(L) 18.1(L)  Platelets 150 - 400 K/uL 266 251 339    . CMP Latest Ref Rng & Units 02/19/2020 02/18/2020 01/27/2020  Glucose 70 - 99 mg/dL 82 100(H) 132(H)  BUN 8 - 23 mg/dL 20 23 9   Creatinine 0.61 - 1.24 mg/dL 1.03 1.22 1.07  Sodium 135 - 145 mmol/L 139 138 136  Potassium 3.5 - 5.1 mmol/L 3.6 4.8 4.3  Chloride 98 - 111 mmol/L 101 100 97(L)  CO2 22 - 32 mmol/L 27 25 30   Calcium 8.9 - 10.3 mg/dL 8.2(L) 9.2 8.0(L)  Total Protein 6.5 - 8.1 g/dL 4.8(L) 5.7(L) -  Total Bilirubin 0.3 - 1.2 mg/dL 0.8 0.6 -  Alkaline Phos 38 - 126 U/L 56 68 -  AST 15 - 41 U/L 15 23 -  ALT 0 - 44 U/L 15 20 -   Studies/Results: DG Chest Port 1 View  Result Date: 02/18/2020 CLINICAL DATA:  74 year old male with shortness of breath. EXAM: PORTABLE CHEST 1 VIEW COMPARISON:  Chest radiograph dated 01/27/2020. FINDINGS: There is a small right pleural effusion. Diffuse right lung interstitial coarsening and opacities most consistent with infiltrate and relative the unchanged. Areas of subpleural scarring noted in the right apex and right lung base with overall decreased volume loss in the right lung. There is background of emphysema. No pneumothorax. The cardiac silhouette is within limits. No acute osseous pathology. IMPRESSION: Small right pleural effusion and right lung infiltrate similar to prior radiograph. Electronically Signed   By: Anner Crete M.D.   On: 02/18/2020 17:07    Principal Problem:   Acute GI bleeding Active Problems:   Hyperlipidemia   Chronic respiratory failure with hypoxia (HCC)   Essential hypertension   DNR (do not resuscitate)   Stage 4 very severe  COPD by GOLD classification ([place])   PAD (peripheral artery disease) ([place])   Acute blood loss anemia   Lung infiltrate on CT   Lactic acidosis    Tye Savoy, NP-C @  02/19/2020, 2:02 PM

## 2020-02-19 NOTE — Consult Note (Signed)
NAME:  Francisco [name], MRN:  [id number], DOB:  [birth year], LOS: 1 ADMISSION DATE:  02/18/2020, CONSULTATION DATE:  02/19/2020 REFERRING MD:  Dr Dwyane Dee, CHIEF COMPLAINT:  Abnormal CT chest   Brief History   74 y/o M, former smoker, recently had surgery for critical limb ischemia. 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  Subsequently taken for left femoral endarterectomy with placement of Dacron patch, left common iliac artery stent and stent of left external iliac artery.  Complaining of shortness of breath Found to be anemic GI evaluation ongoing CT scan of the chest reveals infiltrative process right lung  History of present illness   74 y/o M, former smoker, with 4L O2 dependent COPD and recent treatment for pseudomonas pneumonia. Admitted with shortness of breath and weakness Found to have anemia He has advanced chronic obstructive pulmonary disease, chronic respiratory failure on 4 L of oxygen at home Diastolic heart failure Severe peripheral arterial disease s/p endarterectomy and recent left femoral patch. History of hypertension, pulmonary embolism on anticoagulation with Xarelto Sent into the hospital for further evaluation of anemia  Has had exertional dyspnea Shortness of breath with climbing steps He has a cough but not really bringing up much secretions He feels his breathing is better today  Denies any fever Denies any chills Compliant with his usual inhalers  He is on oxygen around-the-clock   Past Medical History   Past Medical History:  Diagnosis Date  . Chronic back pain    "mid and lower" (04/07/2018)  . Chronic rhinitis    -Sinus Ct 08/01/2009 >> Bilateral maxillary sinusitis with some mucosal thickeningin the sphenoid and frontal sinuses as well with air fluid levels present -chronic rhinitis flyer Aug 04, 2009  . Compressed spine fracture ([place])   . COPD (chronic obstructive pulmonary disease) [facility])    PFT's rec  Jul 17, 2009  . Emphysema lung ([place])   . On home oxygen therapy    "3L; 24/7" (04/07/2018)  . Onychomycosis    Dr. Judi Cong  . Orthostatic hypotension    "since 10/2017" (04/07/2018)  . PAD (peripheral artery disease) ([place])   . Pneumonia    "twice in 1 year" (04/07/2018)  . Pulmonary embolism ([place]) 04/07/2018  . Skin cancer    "lips, face, ears, arms" (04/07/2018)  . Vertigo    "since ~ 02/2018" (04/07/2018)   Significant Hospital Events   Anemia  Consults:  GI Pulmonary  Procedures:    Significant Diagnostic Tests:  CT chest 8/11 IMPRESSION: 1. Interval development of patchy airspace consolidation in the inferior right upper lobe with new patchy airspace disease in the posterior right middle lobe and right lower lobe. Imaging features are compatible with multifocal pneumonia. Multifocal neoplasm (adenocarcinoma) considered less likely but close follow-up recommended. 2. New small right pleural effusion. 3. 8 mm posterior left lower lobe pulmonary nodule, new since prior. This may be infectious/inflammatory, but follow-up CT in 3 months recommended to ensure resolution. 4. Architectural distortion consolidative opacity in the right apex is similar to older CT of 04/07/2018 although opacity inferiorly in this region is more confluent than on the study from 2 years ago. This is likely related to evolution of scarring, but continued attention on follow-up recommended. 5. Aortic Atherosclerosis (ICD10-I70.0) and Emphysema (ICD10-J43.9).  Micro Data:  Expectorated sputum 7/22 showed Pseudomonas, Achromobacter xylosoxidanns Blood culture 02/19/2020>>  Antimicrobials:  none  Interim history/subjective:  Shortness of breath with his breathing feeling a little bit better at  present Weakness  Objective   Blood pressure 112/67, pulse (!) 110, temperature 98.5 F (36.9 C), temperature source Oral, resp. rate 18, height 6' (1.829 m), weight 73.9 kg, SpO2 98 %.         Intake/Output Summary (Last 24 hours) at 02/19/2020 1436 Last data filed at 02/19/2020 1330 Gross per 24 hour  Intake 870 ml  Output 1250 ml  Net -380 ml   Filed Weights   02/18/20 2322 02/19/20 0248  Weight: 73.5 kg 73.9 kg    Examination: General: Elderly gentleman does not appear to be in distress, frail-appearing HENT: Moist oral mucosa Lungs: Decreased air entry bilaterally Cardiovascular: S1-S2 appreciated Abdomen: Bowel sounds appreciated Extremities: No clubbing, no edema Neuro: Alert and oriented x3 GU:   Resolved Hospital Problem list     Assessment & Plan:  Anemia -Felt to be related to GI bleeding -Did have tarry stools -On anticoagulation for PE-was on Xarelto, on hold at present  Stage IV chronic obstructive pulmonary disease -Continue home bronchodilators -Nebulization treatments  Chronic respiratory failure -4 L of oxygen at baseline  Recent treatment for Pseudomonas pneumonia -Had 14 days of IV cefepime through a PICC line in May 2021 -Pseudomonas did clear up -Repeat culture did not reveal Pseudomonas  Concern for recurrent infection -Follow sputum culture -Not manifesting symptoms suggesting an ongoing infection -No leukocytosis, procalcitonin and CRP not elevated  PAD -Recent intervention  Abnormal CT scan of the chest is likely consistent with mucous plugging, not clearing secretions well -Bronchodilator treatments -Hypertonic saline nebulization -Flutter valve use   Patient is close to baseline Feels breathing is better Cannot be further optimized apart from helping him clear secretions as best as we can Stable for EGD- there will always be a significant risk of decompensation with his advanced pulmonary process  Sherrilyn Rist, MD [place] [facility] Pager: [phone]

## 2020-02-19 NOTE — H&P (View-Only) (Signed)
Referring Provider:  [facility]         Primary Care Physician:  [name], MD Primary Gastroenterologist: Althia Forts            We were asked to see this patient for:   Anemia and dark stool             ASSESSMENT /  PLAN    CC: dark stool   Francisco [name] is a 74 y.o. male PMH significant for, but not necessarily limited to,  COPD on home 02, severe PVD, diastolic heart failure, HTN, history of PE on anticoagulant, history of colon polyps.                                                                                                                                    # Acute on chronic [place] anemia with dark stools on ASA, prednisone, and Xarelto  --Hgb down from 9.5 on 7/26 to 5.1.  --Hgb improved to 8.2 post 2 units of blood.  --Starting having very dark stools just after hospital discharge late July ( when ASA added to Xarelto). Rule out erosive disease / PUD / AMVs, neoplasm.  --Patient needs an EGD. He is at increased risk for sedation given advanced COPD. The risks and benefits of EGD were discussed and the patient agrees to proceed. Will probably proceed tomorrow. --Clear liquids, NPO after MN --BID PPI  # Hx of adenomatous colon polyps Sept 2011 and 3 years prior.  --Follow up colonoscopy never done, apparently secondary to pulmonary disease.  --He may be too high risk for polyps surveillance program but it EGD is negative we may have to consider colonoscopy, especially given his need for anticoagulation.   # Hx of PE, on chronic Xarelto --Last dose yesterday am.   # COPD, 02 dependent.   # Lung infiltrate on CT scan --Admitting team doubts infectious. Workup in progress. Pulmonary and ID were consulted.   HPI:    Chief Complaint: anemia.   Francisco [name] is a 74 y.o. male with multiple medical problems as listed below. He was sent by Vascular Surgery to hospital yesterday for anemia. Patient was recently hospitalized for LLE ischemia, s/p left  femoral endarterectomy on 7/19. Following the procedure his hemoglobin declined from 9.2 to 7.9. He was felt to have post-op anemia, was transfused 2 u blood on 7/19. Hemoglobin around time of discharge was 9.5.  Xarelto was resumed and ASA added at time of discharge.   Patient's hgb at Vascular Surgery's office yesterday was 5. Family Medicine admitted patient today, transfused 2 units of blood, started IV PPI and placed ASA / Xarelto on hold. His hgb is 8.2 today.   Patient's wife noticed the dark stools shortly after hospital discharge. He doesn't take iron or bismuth. No change in consistency / frequency of stools. Other than daily baby ASA he doesn't take NSAIDS. Not on  acid blockers. No history of PUD or GERD. He denies abdominal pain and no N/V. Appetite is fine. At home  patient became progressively weak, breathing got worse.    No [place] of colon cancer or gastric cancer.    PREVIOUS ENDOSCOPIC EVALUATIONS / GI STUDIES :  Colonoscopy 2011 ( polyp surveillance for polyp 3 years earlier). Dr. Earlean Shawl -3 polyps removed, all ~ 8 mm -Hemorrhoids.   Path c/w tubular adenoma  Past Medical History:  Diagnosis Date  . Chronic back pain    "mid and lower" (04/07/2018)  . Chronic rhinitis    -Sinus Ct 08/01/2009 >> Bilateral maxillary sinusitis with some mucosal thickeningin the sphenoid and frontal sinuses as well with air fluid levels present -chronic rhinitis flyer Aug 04, 2009  . Compressed spine fracture ([place])   . COPD (chronic obstructive pulmonary disease) [facility])    PFT's rec Jul 17, 2009  . Emphysema lung ([place])   . On home oxygen therapy    "3L; 24/7" (04/07/2018)  . Onychomycosis    Dr. Judi Cong  . Orthostatic hypotension    "since 10/2017" (04/07/2018)  . PAD (peripheral artery disease) ([place])   . Pneumonia    "twice in 1 year" (04/07/2018)  . Pulmonary embolism ([place]) 04/07/2018  . Skin cancer    "lips, face, ears, arms" (04/07/2018)  . Vertigo    "since ~ 02/2018" (04/07/2018)     Past Surgical History:  Procedure Laterality Date  . ABDOMINAL AORTOGRAM W/LOWER EXTREMITY N/A 01/21/2020   Procedure: ABDOMINAL AORTOGRAM W/LOWER EXTREMITY;  Surgeon: Elam Dutch, MD;  Location: [place] [facility];  Service: Cardiovascular;  Laterality: N/A;  . APPENDECTOMY    . CATARACT EXTRACTION, BILATERAL    . ENDARTERECTOMY FEMORAL Right 04/19/2019   Procedure: ENDARTERECTOMY RIGHT COMMON FEMORAL;  Surgeon: Elam Dutch, MD;  Location: [place];  Service: Vascular;  Laterality: Right;  . ENDARTERECTOMY FEMORAL Left 01/24/2020   Procedure: LEFT FEMORAL ENDARTERECTOMY WITH DACRON PATCH ANGIOPLASTY;  Surgeon: Elam Dutch, MD;  Location: [place];  Service: Vascular;  Laterality: Left;  . FEMORAL ENDARTERECTOMY Left 01/24/2020  . INSERTION OF ILIAC STENT Left 01/24/2020   Procedure: INSERTION OF VBX STENT 8X59 AND 8X39 LEFT COMMON ILIAC ARTERY. INSERTION OF INNOVA 7 X 60 INNOVA STENT LEFT EXTERNAL ILIAC ARTERY. ;  Surgeon: Elam Dutch, MD;  Location: [place];  Service: Vascular;  Laterality: Left;  . LOWER EXTREMITY ANGIOGRAPHY  12/11/2018  . LOWER EXTREMITY ANGIOGRAPHY N/A 12/11/2018   Procedure: LOWER EXTREMITY ANGIOGRAPHY;  Surgeon: Elam Dutch, MD;  Location: [place] [facility];  Service: Cardiovascular;  Laterality: N/A;  . PATCH ANGIOPLASTY Right 04/19/2019   Procedure: Patch Angioplasty;  Surgeon: Elam Dutch, MD;  Location: [place];  Service: Vascular;  Laterality: Right;  . PERIPHERAL VASCULAR INTERVENTION Right 12/11/2018   Procedure: PERIPHERAL VASCULAR INTERVENTION;  Surgeon: Elam Dutch, MD;  Location: [place] [facility];  Service: Cardiovascular;  Laterality: Right;  Common Iliac   . SKIN CANCER EXCISION     "lips, face, ears, arms" (04/07/2018)  . TONSILLECTOMY    . ULTRASOUND GUIDANCE FOR VASCULAR ACCESS Right 01/24/2020   Procedure: ULTRASOUND GUIDANCE FOR VASCULAR ACCESS;  Surgeon: Elam Dutch, MD;  Location: [facility] OR;  Service: Vascular;   Laterality: Right;    Prior to Admission medications   Medication Sig Start Date End Date Taking? Authorizing Provider  acetaminophen (TYLENOL) 500 MG tablet Take 1,000 mg by mouth every 6 (six) hours as needed for  moderate pain.    Yes [provider]  albuterol (VENTOLIN HFA) 108 (90 Base) MCG/ACT inhaler USE 2 PUFFS EVERY 6 HOURS  AS NEEDED FOR WHEEZING Patient taking differently: Inhale 2 puffs into the lungs every 6 (six) hours as needed for wheezing or shortness of breath.  07/30/19  Yes Martyn Ehrich, NP  aspirin EC 81 MG EC tablet Take 1 tablet (81 mg total) by mouth daily. Swallow whole. 01/31/20  Yes Dagoberto Ligas, PA-C  azithromycin (ZITHROMAX) 250 MG tablet Take 1 tablet (250 mg total) by mouth every Monday, Wednesday, and Friday. 04/30/19  Yes Lauraine Rinne, NP  budesonide-formoterol (SYMBICORT) 160-4.5 MCG/ACT inhaler Inhale 2 puffs into the lungs 2 (two) times daily.   Yes [provider]  Calcium Carbonate-Vitamin D (CALCIUM 600+D PO) Take 2 tablets by mouth daily.   Yes [provider]  cycloSPORINE (RESTASIS) 0.05 % ophthalmic emulsion Place 1 drop into both eyes 2 (two) times daily as needed (dry eyes).    Yes [provider]  fluticasone (FLONASE) 50 MCG/ACT nasal spray Place 2 sprays into both nostrils daily. 11/29/19  Yes [name], MD  furosemide (LASIX) 20 MG tablet TAKE 1 TABLET BY MOUTH  DAILY Patient taking differently: Take 20 mg by mouth daily.  11/29/19  Yes [name], MD  OXYGEN Inhale 4-5 L into the lungs continuous.    Yes [provider]  predniSONE (DELTASONE) 10 MG tablet Take 1 tablet (10 mg total) by mouth daily with breakfast. 09/21/19  Yes Lauraine Rinne, NP  rivaroxaban (XARELTO) 20 MG TABS tablet TAKE 1 TABLET BY MOUTH  DAILY Patient taking differently: Take 20 mg by mouth daily.  06/02/19  Yes [name], MD  roflumilast (DALIRESP) 500 MCG TABS tablet Take 1 tablet (500 mcg total) by  mouth daily. 08/02/19  Yes Brand Males, MD  sodium chloride HYPERTONIC 3 % nebulizer solution Take by nebulization daily. Patient taking differently: Take 4 mLs by nebulization daily as needed (to loosen phlegm).  11/11/19  Yes Brand Males, MD  SPIRIVA RESPIMAT 2.5 MCG/ACT AERS USE 2 INHALATIONS BY MOUTH  ONCE DAILY Patient taking differently: Take 2 puffs by mouth daily.  03/03/19  Yes Brand Males, MD  traMADol (ULTRAM) 50 MG tablet Take 1 tablet (50 mg total) by mouth every 12 (twelve) hours as needed for moderate pain or severe pain. Patient taking differently: Take 50 mg by mouth See admin instructions. Take one tablet (50 mg) by mouth daily at bedtime, may also take one tablet (50 mg) 12 hours later if still needed for pain 01/31/20  Yes Dagoberto Ligas, PA-C  Respiratory Therapy Supplies (FLUTTER) DEVI 10 times Twice a day and prn as needed, may increase if feeling worse 10/15/19   Lauraine Rinne, NP    Current Facility-Administered Medications  Medication Dose Route Frequency Provider Last Rate Last Admin  . acetaminophen (TYLENOL) tablet 650 mg  650 mg Oral Q6H PRN Shalhoub, Sherryll Burger, MD       Or  . acetaminophen (TYLENOL) suppository 650 mg  650 mg Rectal Q6H PRN Shalhoub, Sherryll Burger, MD      . Derrill Memo ON 02/21/2020] azithromycin (ZITHROMAX) tablet 250 mg  250 mg Oral Q M,W,F Shalhoub, Sherryll Burger, MD      . cycloSPORINE (RESTASIS) 0.05 % ophthalmic emulsion 1 drop  1 drop Both Eyes BID PRN Shalhoub, Sherryll Burger, MD      . fluticasone (FLONASE) 50 MCG/ACT nasal spray 2 spray  2 spray Each Nare Daily Shalhoub, Sherryll Burger, MD      . fluticasone furoate-vilanterol (BREO ELLIPTA) 200-25 MCG/INH 1 puff  1 puff Inhalation Daily Shalhoub, Sherryll Burger, MD      . folic acid (FOLVITE) tablet 1 mg  1 mg Oral Daily Shalhoub, Sherryll Burger, MD   1 mg at 02/19/20 0906  . furosemide (LASIX) tablet 20 mg  20 mg Oral Daily Shalhoub, Sherryll Burger, MD   20 mg at 02/19/20 0906  . ipratropium-albuterol (DUONEB) 0.5-2.5  (3) MG/3ML nebulizer solution 3 mL  3 mL Nebulization Q4H PRN Shalhoub, Sherryll Burger, MD      . LORazepam (ATIVAN) tablet 1-4 mg  1-4 mg Oral Q1H PRN Shalhoub, Sherryll Burger, MD       Or  . LORazepam (ATIVAN) injection 1-4 mg  1-4 mg Intravenous Q1H PRN Shalhoub, Sherryll Burger, MD      . multivitamin with minerals tablet 1 tablet  1 tablet Oral Daily Shalhoub, Sherryll Burger, MD   1 tablet at 02/19/20 0906  . ondansetron (ZOFRAN) tablet 4 mg  4 mg Oral Q6H PRN Shalhoub, Sherryll Burger, MD       Or  . ondansetron [facility]) injection 4 mg  4 mg Intravenous Q6H PRN Shalhoub, Sherryll Burger, MD      . pantoprazole (PROTONIX) injection 40 mg  40 mg Intravenous Q12H Shalhoub, Sherryll Burger, MD   40 mg at 02/19/20 0906  . predniSONE (DELTASONE) tablet 10 mg  10 mg Oral Q breakfast Shalhoub, Sherryll Burger, MD   10 mg at 02/19/20 0906  . roflumilast (DALIRESP) tablet 500 mcg  500 mcg Oral Daily Shalhoub, Sherryll Burger, MD   500 mcg at 02/19/20 0907  . sodium chloride HYPERTONIC 3 % nebulizer solution 4 mL  4 mL Nebulization Daily PRN Shalhoub, Sherryll Burger, MD      . thiamine tablet 100 mg  100 mg Oral Daily Shalhoub, Sherryll Burger, MD   100 mg at 02/19/20 7035   Or  . thiamine (B-1) injection 100 mg  100 mg Intravenous Daily Shalhoub, Sherryll Burger, MD      . traMADol Veatrice Bourbon) tablet 50 mg  50 mg Oral Q6H PRN Shalhoub, Sherryll Burger, MD      . umeclidinium bromide (INCRUSE ELLIPTA) 62.5 MCG/INH 1 puff  1 puff Inhalation Daily Shalhoub, Sherryll Burger, MD        Allergies as of 02/18/2020 - Review Complete 02/18/2020  Allergen Reaction Noted  . Tape Other (See Comments) 11/02/2017  . Ciprofloxacin Nausea Only 11/26/2019  . Levaquin [levofloxacin] Other (See Comments) 07/14/2018    Family History  Problem Relation Age of Onset  . Heart disease Mother        CABG in her 79s, nonsmoker  . Cancer Mother   . Stroke Father   . Heart disease Father        Died of MI at age 29, smoker  . Hepatitis Sister   . Coronary artery disease Other        male 1st degree  relative <60    Social History   Socioeconomic History  . Marital status: Married    Spouse name: Not on file  . Number of children: 2  . Years of education: 3 years of college  . Highest education level: Not on file  Occupational History    Employer: [employer]  Tobacco Use  . Smoking status: Former Smoker    Packs/day: 2.00    Years: 52.00  Pack years: 104.00    Types: Pipe, Cigarettes    Start date: 10/06/1965    Quit date: 10/06/2017    Years since quitting: 2.3  . Smokeless tobacco: Never Used  . Tobacco comment: 04/07/2018 "smoked cigarettes years ago; stopped ~ 20 yr ago; stopped smoking pipe in 09/2017"  Vaping Use  . Vaping Use: Never used  Substance and Sexual Activity  . Alcohol use: Yes    Alcohol/week: 28.0 standard drinks    Types: 28 Cans of beer per week    Comment: "04/15/2019 "4 beers/day; probably closer to 28/wk"  . Drug use: No  . Sexual activity: Not Currently  Other Topics Concern  . Not on file  Social History Narrative   Married (wife Katy Apo patient of Dr. Yong Channel) with children (2-son and daughter). 1 grandson.       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       Hobbies: fishing, projects at home   Social Determinants of Health   Financial Resource Strain:   . Difficulty of Paying Living Expenses:   Food Insecurity:   . Worried About Charity fundraiser in the Last Year:   . Arboriculturist in the Last Year:   Transportation Needs:   . Film/video editor (Medical):   [name] Kitchen Lack of Transportation (Non-Medical):   Physical Activity:   . Days of Exercise per Week:   . Minutes of Exercise per Session:   Stress:   . Feeling of Stress :   Social Connections:   . Frequency of Communication with Friends and Family:   . Frequency of Social Gatherings with Friends and Family:   . Attends Religious Services:   . Active Member of Clubs or Organizations:   . Attends Archivist Meetings:   [name] Kitchen Marital Status:     Intimate Partner Violence:   . Fear of Current or Ex-Partner:   . Emotionally Abused:   [name] Kitchen Physically Abused:   . Sexually Abused:     Review of Systems: All systems reviewed and negative except where noted in HPI.  Physical Exam: Vital signs in last 24 hours: Temp:  [97.5 F (36.4 C)-98.6 F (37 C)] 98.5 F (36.9 C) (08/14 1117) Pulse Rate:  [99-117] 110 (08/14 1117) Resp:  [17-20] 18 (08/14 1117) BP: (105-146)/(50-85) 112/67 (08/14 1117) SpO2:  [95 %-100 %] 98 % (08/14 1117) Weight:  [73.5 kg-73.9 kg] 73.9 kg (08/14 0248) Last BM Date: 02/18/20 General:   Alert, well-developed,  male in NAD. Wife Lilly in room.  Psych:  Pleasant, cooperative. Normal mood and affect. Eyes:  Pupils equal, sclera clear, no icterus.   Conjunctiva pink. Ears:  Normal auditory acuity. Nose:  No deformity, discharge,  or lesions. Neck:  Supple; no masses Lungs: On 02 per [place]. Decreased breath sounds bilaterally.  No wheezes  Heart:  Regular rate and rhythm; no lower extremity edema Abdomen:  Soft, non-distended, nontender, BS active, no palp mass   Rectal:  Deferred  Msk:  Symmetrical without gross deformities. . Neurologic:  Alert and  oriented x4;  grossly normal neurologically. Skin:  Intact without significant lesions or rashes.   Intake/Output from previous day: 08/13 0701 - 08/14 0700 In: 870 [P.O.:240; Blood:630] Out: 525 [Urine:525] Intake/Output this shift: Total I/O In: -  Out: 725 [Urine:725]  Lab Results: Recent Labs    02/18/20 1645 02/19/20 0447 02/19/20 1142  WBC 11.6* 8.1 10.4  HGB 5.0* 7.7* 8.2*  HCT  18.1* 24.0* 25.7*  PLT 339 251 266   BMET Recent Labs    02/18/20 1645 02/19/20 0452  NA 138 139  K 4.8 3.6  CL 100 101  CO2 25 27  GLUCOSE 100* 82  BUN 23 20  CREATININE 1.22 1.03  CALCIUM 9.2 8.2*   LFT Recent Labs    02/19/20 0452  PROT 4.8*  ALBUMIN 2.4*  AST 15  ALT 15  ALKPHOS 56  BILITOT 0.8   PT/INR Recent Labs    02/18/20 1815  02/19/20 0452  LABPROT 25.1* 18.9*  INR 2.4* 1.7*   Hepatitis Panel No results for input(s): HEPBSAG, HCVAB, HEPAIGM, HEPBIGM in the last 72 hours.   . CBC Latest Ref Rng & Units 02/19/2020 02/19/2020 02/18/2020  WBC 4.0 - 10.5 K/uL 10.4 8.1 11.6(H)  Hemoglobin 13.0 - 17.0 g/dL 8.2(L) 7.7(L) 5.0(LL)  Hematocrit 39 - 52 % 25.7(L) 24.0(L) 18.1(L)  Platelets 150 - 400 K/uL 266 251 339    . CMP Latest Ref Rng & Units 02/19/2020 02/18/2020 01/27/2020  Glucose 70 - 99 mg/dL 82 100(H) 132(H)  BUN 8 - 23 mg/dL 20 23 9   Creatinine 0.61 - 1.24 mg/dL 1.03 1.22 1.07  Sodium 135 - 145 mmol/L 139 138 136  Potassium 3.5 - 5.1 mmol/L 3.6 4.8 4.3  Chloride 98 - 111 mmol/L 101 100 97(L)  CO2 22 - 32 mmol/L 27 25 30   Calcium 8.9 - 10.3 mg/dL 8.2(L) 9.2 8.0(L)  Total Protein 6.5 - 8.1 g/dL 4.8(L) 5.7(L) -  Total Bilirubin 0.3 - 1.2 mg/dL 0.8 0.6 -  Alkaline Phos 38 - 126 U/L 56 68 -  AST 15 - 41 U/L 15 23 -  ALT 0 - 44 U/L 15 20 -   Studies/Results: DG Chest Port 1 View  Result Date: 02/18/2020 CLINICAL DATA:  74 year old male with shortness of breath. EXAM: PORTABLE CHEST 1 VIEW COMPARISON:  Chest radiograph dated 01/27/2020. FINDINGS: There is a small right pleural effusion. Diffuse right lung interstitial coarsening and opacities most consistent with infiltrate and relative the unchanged. Areas of subpleural scarring noted in the right apex and right lung base with overall decreased volume loss in the right lung. There is background of emphysema. No pneumothorax. The cardiac silhouette is within limits. No acute osseous pathology. IMPRESSION: Small right pleural effusion and right lung infiltrate similar to prior radiograph. Electronically Signed   By: Anner Crete M.D.   On: 02/18/2020 17:07    Principal Problem:   Acute GI bleeding Active Problems:   Hyperlipidemia   Chronic respiratory failure with hypoxia (HCC)   Essential hypertension   DNR (do not resuscitate)   Stage 4 very severe  COPD by GOLD classification ([place])   PAD (peripheral artery disease) ([place])   Acute blood loss anemia   Lung infiltrate on CT   Lactic acidosis    Tye Savoy, NP-C @  02/19/2020, 2:02 PM

## 2020-02-19 NOTE — Consult Note (Signed)
[facility]       Reason for Consult: SOB   Referring Physician: Dr. Dwyane Dee  Principal Problem:   Acute GI bleeding Active Problems:   Hyperlipidemia   Chronic respiratory failure with hypoxia ([place])   Essential hypertension   DNR (do not resuscitate)   Stage 4 very severe COPD by GOLD classification ([place])   PAD (peripheral artery disease) ([place])   Acute blood loss anemia   Lung infiltrate on CT   Lactic acidosis   . [START ON 02/21/2020] azithromycin  250 mg Oral Q M,W,F  . fluticasone  2 spray Each Nare Daily  . fluticasone furoate-vilanterol  1 puff Inhalation Daily  . folic acid  1 mg Oral Daily  . furosemide  20 mg Oral Daily  . multivitamin with minerals  1 tablet Oral Daily  . pantoprazole (PROTONIX) IV  40 mg Intravenous Q12H  . predniSONE  10 mg Oral Q breakfast  . roflumilast  500 mcg Oral Daily  . thiamine  100 mg Oral Daily   Or  . thiamine  100 mg Intravenous Daily  . umeclidinium bromide  1 puff Inhalation Daily    Recommendations: Continue supportive care Anemia evaluation per primary team   Will not follow up  Assessment: He has a history of oxygen and steroid dependent COPD/bronchitis and some sob with a Hgb of 5 and resolved with the transfusion.  CXR, procalcitonin without any acute concerns for infectious process.  Hgb up to 8.2 after transfusion.  Plan otherwise per primary team and GI.    Antibiotics: none  HPI: Francisco [name] is a 74 y.o. male with end stage lung disease on chronic steroids and oxygen who came in with acute anemia.  Has a history of melena.  No worsening cough. Had been more progressively weak.  No fever.  On azithromycin three time per week per pulmonary.  No rash.  No changes in his oxygenation.     Review of Systems:  Constitutional: negative for fevers and chills Respiratory: negative for wheezing Integument/breast: negative for rash All other systems reviewed and are negative    Past  Medical History:  Diagnosis Date  . Chronic back pain    "mid and lower" (04/07/2018)  . Chronic rhinitis    -Sinus Ct 08/01/2009 >> Bilateral maxillary sinusitis with some mucosal thickeningin the sphenoid and frontal sinuses as well with air fluid levels present -chronic rhinitis flyer Aug 04, 2009  . Compressed spine fracture ([place])   . COPD (chronic obstructive pulmonary disease) [facility])    PFT's rec Jul 17, 2009  . Emphysema lung ([place])   . On home oxygen therapy    "3L; 24/7" (04/07/2018)  . Onychomycosis    Dr. Judi Cong  . Orthostatic hypotension    "since 10/2017" (04/07/2018)  . PAD (peripheral artery disease) ([place])   . Pneumonia    "twice in 1 year" (04/07/2018)  . Pulmonary embolism ([place]) 04/07/2018  . Skin cancer    "lips, face, ears, arms" (04/07/2018)  . Vertigo    "since ~ 02/2018" (04/07/2018)    Social History   Tobacco Use  . Smoking status: Former Smoker    Packs/day: 2.00    Years: 52.00    Pack years: 104.00    Types: Pipe, Cigarettes    Start date: 10/06/1965    Quit date: 10/06/2017    Years since quitting: 2.3  . Smokeless tobacco: Never Used  . Tobacco comment: 04/07/2018 "smoked cigarettes years  ago; stopped ~ 20 yr ago; stopped smoking pipe in 09/2017"  Vaping Use  . Vaping Use: Never used  Substance Use Topics  . Alcohol use: Yes    Alcohol/week: 28.0 standard drinks    Types: 28 Cans of beer per week    Comment: "04/15/2019 "4 beers/day; probably closer to 28/wk"  . Drug use: No    Family History  Problem Relation Age of Onset  . Heart disease Mother        CABG in her 75s, nonsmoker  . Cancer Mother   . Stroke Father   . Heart disease Father        Died of MI at age 48, smoker  . Hepatitis Sister   . Coronary artery disease Other        male 1st degree relative <60    Allergies  Allergen Reactions  . Tape Other (See Comments)    SKIN IS VERY THIN, TEARS SKIN; CAN ONLY USE COBAN WRAPS DUE TO CONDITION OF SKIN!!  . Ciprofloxacin Nausea Only     Sick on stomach, weak/tired  . Levaquin [Levofloxacin] Other (See Comments)    hallucinations    Physical Exam: Constitutional: in no apparent distress  Vitals:   02/19/20 0758 02/19/20 1117  BP: (!) 105/54 112/67  Pulse: (!) 108 (!) 110  Resp: 18 18  Temp: 98.6 F (37 C) 98.5 F (36.9 C)  SpO2: 98% 98%   EYES: anicteric Cardiovascular: Cor RRR Respiratory: clear; Musculoskeletal: no pedal edema noted Skin: negatives: no rash Neuro: non-focal  Lab Results  Component Value Date   WBC 10.4 02/19/2020   HGB 8.2 (L) 02/19/2020   HCT 25.7 (L) 02/19/2020   MCV 86.0 02/19/2020   PLT 266 02/19/2020    Lab Results  Component Value Date   CREATININE 1.03 02/19/2020   BUN 20 02/19/2020   NA 139 02/19/2020   K 3.6 02/19/2020   CL 101 02/19/2020   CO2 27 02/19/2020    Lab Results  Component Value Date   ALT 15 02/19/2020   AST 15 02/19/2020   ALKPHOS 56 02/19/2020     Microbiology: Recent Results (from the past 240 hour(s))  SARS Coronavirus 2 by RT PCR (hospital order, performed in [place] hospital lab) Nasopharyngeal Nasopharyngeal Swab     Status: None   Collection Time: 02/18/20  6:10 PM   Specimen: Nasopharyngeal Swab  Result Value Ref Range Status   SARS Coronavirus 2 NEGATIVE NEGATIVE Final    Comment: (NOTE) SARS-CoV-2 target nucleic acids are NOT DETECTED.  The SARS-CoV-2 RNA is generally detectable in upper and lower respiratory specimens during the acute phase of infection. The lowest concentration of SARS-CoV-2 viral copies this assay can detect is 250 copies / mL. A negative result does not preclude SARS-CoV-2 infection and should not be used as the sole basis for treatment or other patient management decisions.  A negative result may occur with improper specimen collection / handling, submission of specimen other than nasopharyngeal swab, presence of viral mutation(s) within the areas targeted by this assay, and inadequate number of viral  copies (<250 copies / mL). A negative result must be combined with clinical observations, patient history, and epidemiological information.  Fact Sheet for Patients:   StrictlyIdeas.no  Fact Sheet for Healthcare Providers: BankingDealers.co.za  This test is not yet approved or  cleared by the [place] FDA and has been authorized for detection and/or diagnosis of SARS-CoV-2 by FDA under an Emergency Use Authorization (EUA).  This EUA will remain in effect (meaning this test can be used) for the duration of the COVID-19 declaration under Section 564(b)(1) of the Act, 21 U.S.C. section 360bbb-3(b)(1), unless the authorization is terminated or revoked sooner.  Performed at [facility], [place] [address] [place], [place] [address]   Expectorated sputum assessment w rflx to resp cult     Status: None   Collection Time: 02/19/20 12:49 AM   Specimen: Expectorated Sputum  Result Value Ref Range Status   Specimen Description EXPECTORATED SPUTUM  Final   Special Requests NONE  Final   Sputum evaluation   Final    THIS SPECIMEN IS ACCEPTABLE FOR SPUTUM CULTURE Performed at [facility], [place] [address] [place], [place] [address]    Report Status 02/19/2020 FINAL  Final  Culture, respiratory     Status: None (Preliminary result)   Collection Time: 02/19/20 12:49 AM  Result Value Ref Range Status   Specimen Description EXPECTORATED SPUTUM  Final   Special Requests NONE Reflexed from [id number]  Final   Gram Stain   Final    RARE WBC PRESENT, PREDOMINANTLY PMN RARE SQUAMOUS EPITHELIAL CELLS PRESENT ABUNDANT GRAM NEGATIVE RODS FEW GRAM POSITIVE COCCI FEW GRAM POSITIVE RODS Performed at [facility], [address] [place], [place] [address]    Culture PENDING  Incomplete   Report Status PENDING  Incomplete    Thayer Headings, [facility] [place]  [facility] [email or web address] 02/19/2020, 12:39 PM

## 2020-02-19 NOTE — Progress Notes (Signed)
Patient did not want to take Breo and Incruse inhalers. Stated they did not work for him. Patient has brought medication from home. Symbicort and Combivent Resp.

## 2020-02-19 NOTE — Progress Notes (Signed)
PROGRESS NOTE    Francisco [name]  NWG:[id number] DOB: 12-07-1945 DOA: 02/18/2020 PCP: [name], MD    Brief Narrative:  74 year old male with past medical history of advanced COPD, chronic respiratory failure on 4 L of oxygen via nasal cannula, diastolic congestive heart failure (echo 10/2017 EF 65-70%), severe peripheral vascular disease (S/P endarterectomy right femoral in 2020 and left femoral 01/2020), hypertension, history of PE on chronic anticoagulation with Xarelto who presents to [facility] emergency department after being sent over by his vascular surgeon [name] for acute anemia.  Of note, patient was recently hospitalized at [facility] from 7/16-7/26 for left femoral endarterectomy due to critical limb ischemia of left lower extremity.  During that hospitalization, the patient was noted to be anemic however this was felt to be perioperative anemia.  The patient was transfused 2 units of packed red blood cells and eventually discharged home with a hemoglobin of 9.5 on 7/26.  Patient's home regimen of Xarelto 20 mg daily was resumed as well as his home regimen of aspirin 81 mg daily at time of discharge.  Patient explains that since he was discharged, he has been experiencing frequent bouts of black tarry stool.  This had been happening at least once daily.  Patient denies any associated abdominal pain, nausea, vomiting, changes in appetite.  In the weeks that followed, patient developed progressively worsening generalized weakness, shortness of breath beyond his baseline and lightheadedness.  The patient has become so weak that he requires the assistance of his family to even get out of his bed or chair where at baseline he is typically able to ambulate on his own.  Patient followed with [name] in clinic on 8/11 who obtained a CBC revealed a hemoglobin of 5.0.  Patient was instructed to go to [facility] emergency department for evaluation at this  time.  Upon evaluation in the emergency department CBC did confirm hemoglobin of 5.0, down from 9.5 in late July.  2 units of packed red blood cells were ordered.  Coagulation profile did reveal an INR of 2.4.  80 mg of Protonix were administered.  500 cc of normal saline was administered.  The hospitalist group was then called to assess the patient for admission to the hospital.  Patient is admitted for acute GI bleeding, has received 2 units PRBC posttransfusion hemoglobin 8.5, GI consulted.   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  S/p 2 units PRBC transfusion , Hb 8.5   Check H and H Q6hrs, achieve target hgb of at least 7.  Considering advanced COPD and risks of sedation with EGD, I anticipate GI will try to manage conservatively for now.  Clear liquid diet  Admit to stepdown unit  Protonix IV 80mg  x 1 followed by 40mg  IV Q12hrs  GI consulted,  will follow recommendation.    Lactic acidosis >>> resolved   Likely secondary to severe  anemia  Transfusing patient  Monitor lactic acid levels to ensure downtrending  Chronic respiratory failure with hypoxia ([facility])   Continue to provide 3 liters at rest and 4 liters with exertion to achieve target SpO2 88-92%  Stage 4 very severe COPD by GOLD classification ([place])   Continue home regimen of maintenance inhalers, PRN Duonebs  PAD (peripheral artery disease) ([facility])   Advanced disease with  significant hyperemia of the distal bilateral lower extremities - I am unsure that there is any underlying cellulitis as patient/wife state this is chronic  Additionally patient has necrotic appearance of multiple toes of left foot however this has been seen by vascular 2 days ago and wife/husband state that this is stable/improved from last month  Lung infiltrate on CT  Patient has followed with [name] as of late for "frequent bouts of bronchitis" and has received several rounds of recent abx, last ID visit was in mid July where [name] states he felt further abx weren't indicated  CT done by [name] with Pulmonary 2 days ago revealing new multifocal infiltrates of the right lung.    That being said, patient denies changes in his chronic productive cough   In the absence of fever and mild leukocytosis (which is stable compared to prior CBC's - likely due to steriods) I am skeptical as to whether patient's new infiltrates are infection.  Will get CRP, procalcitonin, sputum and blood cultures - will start abx if any of these suggest infection  Pulmonology and infectious disease consulted will await recommendation.    DVT prophylaxis: SCDs. Code Status: DNR Family Communication:  D/W patient and his wife Disposition Plan: to be determined.  Consultants:   Infectious disease, pulmonology, gastroenterology  Procedures:  None. Antimicrobials:  Anti-infectives (From admission, onward)   Start     Dose/Rate Route Frequency Ordered Stop   02/21/20 1000  azithromycin (ZITHROMAX) tablet 250 mg     Discontinue     250 mg Oral Every M-W-F 02/18/20 2025      Subjective: Patient was seen and examined at bedside.  No overnight events.  Patient reports feeling better,  patient reports black stools for last 3 weeks. Patient denies any dizziness, palpitation, chest pain, shortness of breath.  Objective: Vitals:   02/19/20 0248 02/19/20 0627 02/19/20 0758 02/19/20 1117  BP:   116/67 (!) 105/54 112/67  Pulse:  100 (!) 108 (!) 110  Resp:  17 18 18   Temp:  98 F (36.7 C) 98.6 F (37 C) 98.5 F (36.9 C)  TempSrc:  Oral Oral Oral  SpO2:  100% 98% 98%  Weight: 73.9 kg     Height:        Intake/Output Summary (Last 24 hours) at 02/19/2020 1309 Last data filed at 02/19/2020 0500 Gross per 24 hour  Intake 870 ml  Output 525 ml  Net 345 ml   Filed Weights   02/18/20 2322 02/19/20 0248  Weight: 73.5 kg 73.9 kg    Examination:  General exam: Appears calm and comfortable  Respiratory system: Clear to auscultation. Respiratory effort normal. Cardiovascular system: S1 & S2 heard, RRR. No JVD, murmurs, rubs, gallops or clicks. No pedal edema. Gastrointestinal system: Abdomen is nondistended, soft and nontender. No organomegaly or masses felt. Normal bowel sounds heard. Central nervous system: Alert and oriented. No focal neurological deficits. Extremities: No edema, no cyanosis no swelling. Skin: No rashes, lesions or ulcers Psychiatry: Judgement and insight appear normal. Mood & affect appropriate.     Data  Reviewed: I have personally reviewed following labs and imaging studies  CBC: Recent Labs  Lab 02/18/20 1645 02/19/20 0447 02/19/20 1142  WBC 11.6* 8.1 10.4  NEUTROABS 9.7*  --   --   HGB 5.0* 7.7* 8.2*  HCT 18.1* 24.0* 25.7*  MCV 92.8 87.6 86.0  PLT 339 251 160   Basic Metabolic Panel: Recent Labs  Lab 02/18/20 1645 02/19/20 0452  NA 138 139  K 4.8 3.6  CL 100 101  CO2 25 27  GLUCOSE 100* 82  BUN 23 20  CREATININE 1.22 1.03  CALCIUM 9.2 8.2*  MG  --  1.8   GFR: Estimated Creatinine Clearance: 67.8 mL/min (by C-G formula based on SCr of 1.03 mg/dL). Liver Function Tests: Recent Labs  Lab 02/18/20 1645 02/19/20 0452  AST 23 15  ALT 20 15  ALKPHOS 68 56  BILITOT 0.6 0.8  PROT 5.7* 4.8*  ALBUMIN 2.8* 2.4*   No results for input(s): LIPASE, AMYLASE in the last 168 hours. No results for input(s): AMMONIA in the last 168  hours. Coagulation Profile: Recent Labs  Lab 02/18/20 1815 02/19/20 0452  INR 2.4* 1.7*   Cardiac Enzymes: No results for input(s): CKTOTAL, CKMB, CKMBINDEX, TROPONINI in the last 168 hours. BNP (last 3 results) No results for input(s): PROBNP in the last 8760 hours. HbA1C: No results for input(s): HGBA1C in the last 72 hours. CBG: No results for input(s): GLUCAP in the last 168 hours. Lipid Profile: No results for input(s): CHOL, HDL, LDLCALC, TRIG, CHOLHDL, LDLDIRECT in the last 72 hours. Thyroid Function Tests: No results for input(s): TSH, T4TOTAL, FREET4, T3FREE, THYROIDAB in the last 72 hours. Anemia Panel: No results for input(s): VITAMINB12, FOLATE, FERRITIN, TIBC, IRON, RETICCTPCT in the last 72 hours. Sepsis Labs: Recent Labs  Lab 02/18/20 1815 02/19/20 0447  PROCALCITON  --  0.13  LATICACIDVEN 2.4* 0.6    Recent Results (from the past 240 hour(s))  SARS Coronavirus 2 by RT PCR (hospital order, performed in [facility] hospital lab) Nasopharyngeal Nasopharyngeal Swab     Status: None   Collection Time: 02/18/20  6:10 PM   Specimen: Nasopharyngeal Swab  Result Value Ref Range Status   SARS Coronavirus 2 NEGATIVE NEGATIVE Final    Comment: (NOTE) SARS-CoV-2 target nucleic acids are NOT DETECTED.  The SARS-CoV-2 RNA is generally detectable in upper and lower respiratory specimens during the acute phase of infection. The lowest concentration of SARS-CoV-2 viral copies this assay can detect is 250 copies / mL. A negative result does not preclude SARS-CoV-2 infection and should not be used as the sole basis for treatment or other patient management decisions.  A negative result may occur with improper specimen collection / handling, submission of specimen other than nasopharyngeal swab, presence of viral mutation(s) within the areas targeted by this assay, and inadequate number of viral copies (<250 copies / mL). A negative result must be combined with  clinical observations, patient history, and epidemiological information.  Fact Sheet for Patients:   StrictlyIdeas.no  Fact Sheet for Healthcare Providers: BankingDealers.co.za  This test is not yet approved or  cleared by the [place] FDA and has been authorized for detection and/or diagnosis of SARS-CoV-2 by FDA under an Emergency Use Authorization (EUA).  This EUA will remain in effect (meaning this test can be used) for the duration of the COVID-19 declaration under Section 564(b)(1) of the Act, 21 U.S.C. section 360bbb-3(b)(1), unless the authorization is terminated or revoked sooner.  Performed at [facility], [address] [place], [place] [address]   Expectorated sputum assessment w rflx to resp cult     Status: None   Collection Time: 02/19/20 12:49 AM   Specimen: Expectorated Sputum  Result Value Ref Range Status   Specimen Description EXPECTORATED SPUTUM  Final   Special Requests NONE  Final   Sputum evaluation   Final    THIS SPECIMEN IS ACCEPTABLE FOR SPUTUM CULTURE Performed at [facility], [place] [address] [place], [place] [address]    Report Status 02/19/2020 FINAL  Final  Culture, respiratory     Status: None (Preliminary result)   Collection Time: 02/19/20 12:49 AM  Result Value Ref Range Status   Specimen Description EXPECTORATED SPUTUM  Final   Special Requests NONE Reflexed from [id number]  Final   Gram Stain   Final    RARE WBC PRESENT, PREDOMINANTLY PMN RARE SQUAMOUS EPITHELIAL CELLS PRESENT ABUNDANT GRAM NEGATIVE RODS FEW GRAM POSITIVE COCCI FEW GRAM POSITIVE RODS Performed at [facility], [address] [place], [place] [address]    Culture PENDING  Incomplete   Report Status PENDING  Incomplete    Radiology Studies: DG Chest Port 1 View  Result Date: 02/18/2020 CLINICAL DATA:  75 year old male with shortness of breath. EXAM: PORTABLE CHEST 1 VIEW COMPARISON:  Chest  radiograph dated 01/27/2020. FINDINGS: There is a small right pleural effusion. Diffuse right lung interstitial coarsening and opacities most consistent with infiltrate and relative the unchanged. Areas of subpleural scarring noted in the right apex and right lung base with overall decreased volume loss in the right lung. There is background of emphysema. No pneumothorax. The cardiac silhouette is within limits. No acute osseous pathology. IMPRESSION: Small right pleural effusion and right lung infiltrate similar to prior radiograph. Electronically Signed   By: Anner Crete M.D.   On: 02/18/2020 17:07    Scheduled Meds: . [START ON 02/21/2020] azithromycin  250 mg Oral Q M,W,F  . fluticasone  2 spray Each Nare Daily  . fluticasone furoate-vilanterol  1 puff Inhalation Daily  . folic acid  1 mg Oral Daily  . furosemide  20 mg Oral Daily  . multivitamin with minerals  1 tablet Oral Daily  . pantoprazole (PROTONIX) IV  40 mg Intravenous Q12H  . predniSONE  10 mg Oral Q breakfast  . roflumilast  500 mcg Oral Daily  . thiamine  100 mg Oral Daily   Or  . thiamine  100 mg Intravenous Daily  . umeclidinium bromide  1 puff Inhalation Daily   Continuous Infusions:   LOS: 1 day    Time spent: 35 mins.    Shawna Clamp, MD [facility]   If 7PM-7AM, please contact night-coverage

## 2020-02-20 ENCOUNTER — Encounter (HOSPITAL_COMMUNITY): Payer: Self-pay | Admitting: Internal Medicine

## 2020-02-20 ENCOUNTER — Telehealth: Payer: Self-pay | Admitting: Pulmonary Disease

## 2020-02-20 ENCOUNTER — Encounter (HOSPITAL_COMMUNITY)
Admission: EM | Disposition: A | Discharge status: Home Health | Payer: Self-pay | Source: Home / Self Care | Attending: Family Medicine

## 2020-02-20 ENCOUNTER — Inpatient Hospital Stay (HOSPITAL_COMMUNITY): Payer: Medicare Other | Admitting: Certified Registered Nurse Anesthetist

## 2020-02-20 DIAGNOSIS — K922 Gastrointestinal hemorrhage, unspecified: Secondary | ICD-10-CM | POA: Diagnosis not present

## 2020-02-20 DIAGNOSIS — K2981 Duodenitis with bleeding: Secondary | ICD-10-CM

## 2020-02-20 DIAGNOSIS — K31819 Angiodysplasia of stomach and duodenum without bleeding: Secondary | ICD-10-CM

## 2020-02-20 HISTORY — PX: HOT HEMOSTASIS: SHX5433

## 2020-02-20 HISTORY — PX: HEMOSTASIS CLIP PLACEMENT: SHX6857

## 2020-02-20 HISTORY — PX: ESOPHAGOGASTRODUODENOSCOPY (EGD) WITH PROPOFOL: SHX5813

## 2020-02-20 HISTORY — PX: BIOPSY: SHX5522

## 2020-02-20 LAB — CBC
HCT: 24.9 % — ABNORMAL LOW (ref 39.0–52.0)
Hemoglobin: 7.8 g/dL — ABNORMAL LOW (ref 13.0–17.0)
MCH: 27.1 pg (ref 26.0–34.0)
MCHC: 31.3 g/dL (ref 30.0–36.0)
MCV: 86.5 fL (ref 80.0–100.0)
Platelets: 243 10*3/uL (ref 150–400)
RBC: 2.88 MIL/uL — ABNORMAL LOW (ref 4.22–5.81)
RDW: 15.6 % — ABNORMAL HIGH (ref 11.5–15.5)
WBC: 8.2 10*3/uL (ref 4.0–10.5)
nRBC: 0 % (ref 0.0–0.2)

## 2020-02-20 LAB — BASIC METABOLIC PANEL
Anion gap: 9 (ref 5–15)
BUN: 13 mg/dL (ref 8–23)
CO2: 30 mmol/L (ref 22–32)
Calcium: 8.1 mg/dL — ABNORMAL LOW (ref 8.9–10.3)
Chloride: 100 mmol/L (ref 98–111)
Creatinine, Ser: 0.87 mg/dL (ref 0.61–1.24)
GFR calc Af Amer: >60 mL/min (ref >60)
GFR calc non Af Amer: >60 mL/min (ref >60)
Glucose, Bld: 82 mg/dL (ref 70–99)
Potassium: 3.3 mmol/L — ABNORMAL LOW (ref 3.5–5.1)
Sodium: 139 mmol/L (ref 135–145)

## 2020-02-20 LAB — MAGNESIUM: Magnesium: 1.9 mg/dL (ref 1.7–2.4)

## 2020-02-20 LAB — PHOSPHORUS: Phosphorus: 3 mg/dL (ref 2.5–4.6)

## 2020-02-20 SURGERY — ESOPHAGOGASTRODUODENOSCOPY (EGD) WITH PROPOFOL
Anesthesia: General

## 2020-02-20 MED ORDER — LIDOCAINE HCL (CARDIAC) PF 100 MG/5ML IV SOSY
PREFILLED_SYRINGE | INTRAVENOUS | Status: DC | PRN
  Administered 2020-02-20: 40 mg via INTRAVENOUS

## 2020-02-20 MED ORDER — EPHEDRINE SULFATE-NACL 50-0.9 MG/10ML-% IV SOSY
PREFILLED_SYRINGE | INTRAVENOUS | Status: DC | PRN
  Administered 2020-02-20: 10 mg via INTRAVENOUS

## 2020-02-20 MED ORDER — PHENYLEPHRINE 40 MCG/ML (10ML) SYRINGE FOR IV PUSH (FOR BLOOD PRESSURE SUPPORT)
PREFILLED_SYRINGE | INTRAVENOUS | Status: DC | PRN
  Administered 2020-02-20: 120 ug via INTRAVENOUS
  Administered 2020-02-20: 160 ug via INTRAVENOUS
  Administered 2020-02-20: 120 ug via INTRAVENOUS
  Administered 2020-02-20: 160 ug via INTRAVENOUS

## 2020-02-20 MED ORDER — LACTATED RINGERS IV SOLN
INTRAVENOUS | Status: DC | PRN
Start: 2020-02-20 — End: 2020-02-20

## 2020-02-20 MED ORDER — PROPOFOL 500 MG/50ML IV EMUL
INTRAVENOUS | Status: DC | PRN
  Administered 2020-02-20: 100 ug/kg/min via INTRAVENOUS

## 2020-02-20 MED ORDER — PROPOFOL 10 MG/ML IV BOLUS
INTRAVENOUS | Status: DC | PRN
  Administered 2020-02-20: 20 mg via INTRAVENOUS

## 2020-02-20 SURGICAL SUPPLY — 15 items

## 2020-02-20 NOTE — Anesthesia Preprocedure Evaluation (Addendum)
Anesthesia Evaluation  Patient identified by MRN, date of birth, ID band Patient awake    Reviewed: Allergy & Precautions, NPO status , Patient's Chart, lab work & pertinent test results  Airway Mallampati: III  TM Distance: >3 FB Neck ROM: Full    Dental  (+) Teeth Intact, Dental Advisory Given   Pulmonary COPD,  COPD inhaler, former smoker,     + decreased breath sounds      Cardiovascular hypertension, + Peripheral Vascular Disease   Rhythm:Regular Rate:Normal     Neuro/Psych negative neurological ROS  negative psych ROS   GI/Hepatic negative GI ROS, Neg liver ROS,   Endo/Other  negative endocrine ROS  Renal/GU negative Renal ROS     Musculoskeletal negative musculoskeletal ROS (+)   Abdominal Normal abdominal exam  (+)   Peds  Hematology negative hematology ROS (+)   Anesthesia Other Findings   Reproductive/Obstetrics                             Anesthesia Physical Anesthesia Plan  ASA: III  Anesthesia Plan: General   Post-op Pain Management:    Induction: Intravenous  PONV Risk Score and Plan: 0 and Propofol infusion  Airway Management Planned: Natural Airway and Simple Face Mask  Additional Equipment: None  Intra-op Plan:   Post-operative Plan:   Informed Consent: I have reviewed the patients History and Physical, chart, labs and discussed the procedure including the risks, benefits and alternatives for the proposed anesthesia with the patient or authorized representative who has indicated his/her understanding and acceptance.   Patient has DNR.  Discussed DNR with patient and Suspend DNR.     Plan Discussed with: CRNA  Anesthesia Plan Comments: (Echo:  - Left ventricle: The cavity size was normal. Wall thickness was  normal. Systolic function was vigorous. The estimated ejection  fraction was in the range of 65% to 70%. Wall motion was normal;  there  were no regional wall motion abnormalities. Doppler  parameters are consistent with abnormal left ventricular  relaxation (grade 1 diastolic dysfunction).  - Aortic root: The aortic root was mildly dilated.  - Ascending aorta: The ascending aorta was mildly dilated. )       Anesthesia Quick Evaluation

## 2020-02-20 NOTE — Progress Notes (Signed)
May resume heparin without bolus in 24 hours

## 2020-02-20 NOTE — Anesthesia Procedure Notes (Signed)
Procedure Name: MAC Date/Time: 02/20/2020 7:32 AM Performed by: Leonor Liv, CRNA Pre-anesthesia Checklist: Patient identified, Emergency Drugs available, Suction available, Patient being monitored and Timeout performed Patient Re-evaluated:Patient Re-evaluated prior to induction Oxygen Delivery Method: Nasal cannula Airway Equipment and Method: Bite block Placement Confirmation: positive ETCO2 Dental Injury: Teeth and Oropharynx as per pre-operative assessment

## 2020-02-20 NOTE — Op Note (Signed)
[facility] Patient Name: Francisco [name] Procedure Date : 02/20/2020 MRN: [id number] Attending MD: Justice Britain , MD Date of Birth: February 15, 1946 CSN: [id number] Age: 74 Admit Type: Inpatient Procedure:                Upper GI endoscopy Indications:              Acute post hemorrhagic anemia, Melena, Suspected                            upper gastrointestinal bleeding, Exclusion of                            peptic ulcer Providers:                Justice Britain, MD, Angus Seller, Laverda Sorenson, Technician, Trixie Deis, CRNA Referring MD:             [facility], Jessy Oto. Fields MD, MD Medicines:                Monitored Anesthesia Care Complications:            No immediate complications. Estimated Blood Loss:     Estimated blood loss was minimal. Procedure:                Pre-Anesthesia Assessment:                           - Prior to the procedure, a History and Physical                            was performed, and patient medications and                            allergies were reviewed. The patient's tolerance of                            previous anesthesia was also reviewed. The risks                            and benefits of the procedure and the sedation                            options and risks were discussed with the patient.                            All questions were answered, and informed consent                            was obtained. Prior Anticoagulants: The patient has                            taken Eliquis (apixaban), last dose was 2 days  prior to procedure as well as Aspirin. ASA Grade                            Assessment: III - A patient with severe systemic                            disease. After reviewing the risks and benefits,                            the patient was deemed in satisfactory condition to                            undergo the procedure.                            After obtaining informed consent, the endoscope was                            passed under direct vision. Throughout the                            procedure, the patient's blood pressure, pulse, and                            oxygen saturations were monitored continuously. The                            GIF-H190 ([id number]) Olympus gastroscope was                            introduced through the mouth, and advanced to the                            second part of duodenum. The upper GI endoscopy was                            technically difficult and complex due to poor                            endoscopic visualization. Successful completion of                            the procedure was aided by performing the maneuvers                            documented (below) in this report. Scope In: Scope Out: Findings:      No gross lesions were noted in the entire esophagus.      The Z-line was irregular and was found 40 cm from the incisors.      A 3 cm hiatal hernia was present.      No gross lesions were noted in the entire examined stomach. Biopsies       were taken with a cold forceps for histology and Helicobacter pylori  testing.      Small amounts of red blood were found in the duodenal bulb, in the first       portion of the duodenum and in the second portion of the duodenum.       Lavage of the area was performed using a moderate amount, resulting in       clearance with adequate visualization showing the following.      A single small angioectasia with typical arborization was found in the       posterior wall of the D1/D2 sweep - but this was only see after standard       EGD could not visualize this region in its entirety and we transitioned       to a Duodenoscope. Coagulation for hemostasis using argon plasma was       successful using the duodenoscope. To prevent bleeding       post-intervention, two hemostatic clip was successfully placed (MR        conditional). There was no bleeding at the end of the procedure.      Localized moderate inflammation with hemorrhage characterized by       erosions, erythema and friability was found in the duodenal bulb, in the       D1/D2 sweep and in the second portion of the duodenum. Three areas had       oozing noted. Coagulation for hemostasis using argon plasma was       successful. To prevent bleeding post-intervention, three hemostatic       clips were successfully placed (MR conditional) on the three regions       that were coagulated. There was no bleeding at the end of the procedure.      The major papilla was normal. Impression:               - No gross lesions in esophagus. Z-line irregular,                            40 cm from the incisors.                           - 3 cm hiatal hernia.                           - No gross lesions in the stomach. Biopsied for HP.                           - Blood in the duodenal bulb and in the second                            portion of the duodenum. Lavaged with adequate                            visualization.                           - Using the Duodenoscope, a single angioectasia in                            the duodenum sweep was noted. Treated with argon  plasma coagulation (APC). Clips (MR conditional)                            was placed.                           - Duodenitis with 3 areas of active oozing. Treated                            with argon plasma coagulation (APC). Clips (MR                            conditional) were placed to those regions.                           - Normal major papilla. Recommendation:           - The patient will be observed post-procedure,                            until all discharge criteria are met.                           - Return patient to hospital ward for ongoing care.                           - Patient has a contact number available for                             emergencies. The signs and symptoms of potential                            delayed complications were discussed with the                            patient. Return to normal activities tomorrow.                            Written discharge instructions were provided to the                            patient.                           - Advance diet as tolerated.                           - Await pathology results.                           - Observe patient's clinical course.                           - Trend Hgb/Hct                           - Recommend IV  PPI BID x 24 more hours. Then                            transition to PO PPI BID for next 28-month and                            decrease to once daily if doing well.                           - Anticoagulation question is difficult to answer                            here. If needs anticoagulation sooner rather than                            later then would recommend heparin drip in 12 hours                            (800PM) without bolus and monitor. If able to hold                            on anticoagulation for 72 hours before reinitiation                            then may be most ideal. I think Aspirin 81 mg can                            be restarted today or tomorrow to make sure some                            anti-PLT effect is available after recent                            intervention. If anticoagulation is to be initiated                            first then I would hold aspirin for 72 hours.                           - Patient should maintain some dose of PPI therapy                            indefinitely at this time.                           - If patient has transfusion dependent anemia while                            hospitalized then repeat EGD is reasonable to                            re-evalute the region worked on. Not clear I would  go directly or add-on colonoscopy  since likely                            source of bleeding was noted today, but may need to                            consider. This region can be quite difficult to                            evaluate and treat. IR procedures could be                            considered but hopefully not required.                           - Observe patient's clinical course.                           - Would give 1 dose of IV Iron while in-house and                            plan likely 2nd dose as outpatient.                           - The findings and recommendations were discussed                            with the patient.                           - The findings and recommendations were discussed                            with the patient's family. Procedure Code(s):        --- Professional ---                           [phone], Esophagogastroduodenoscopy, flexible,                            transoral; with biopsy, single or multiple Diagnosis Code(s):        --- Professional ---                           K22.8, Other specified diseases of esophagus                           K44.9, Diaphragmatic hernia without obstruction or                            gangrene                           K92.2, Gastrointestinal hemorrhage, unspecified  K31.819, Angiodysplasia of stomach and duodenum                            without bleeding                           K29.81, Duodenitis with bleeding                           D62, Acute posthemorrhagic anemia                           K92.1, Melena (includes Hematochezia) CPT copyright 2019 [employer]. All rights reserved. The codes documented in this report are preliminary and upon coder review may  be revised to meet current compliance requirements. Justice Britain, MD 02/20/2020 8:48:13 AM Number of Addenda: 0

## 2020-02-20 NOTE — Interval H&P Note (Signed)
History and Physical Interval Note:  02/20/2020 7:23 AM  Francisco [name]  has presented today for surgery, with the diagnosis of upper gastrointestinal bleeding.  The various methods of treatment have been discussed with the patient and family. After consideration of risks, benefits and other options for treatment, the patient has consented to  Procedure(s): ESOPHAGOGASTRODUODENOSCOPY (EGD) WITH PROPOFOL (N/A) as a surgical intervention.  The patient's history has been reviewed, patient examined, no change in status, stable for surgery.  I have reviewed the patient's chart and labs.  Questions were answered to the patient's satisfaction.     [employer]

## 2020-02-20 NOTE — Progress Notes (Signed)
   02/20/20 1717  Assess: MEWS Score  Temp 98.1 F (36.7 C)  BP (!) 98/59  Pulse Rate (!) 110  Resp 18  SpO2 99 %  O2 Device Room Air  Patient Activity (if Appropriate) In bed  Assess: MEWS Score  MEWS Temp 0  MEWS Systolic 1  MEWS Pulse 1  MEWS RR 0  MEWS LOC 0  MEWS Score 2  MEWS Score Color Yellow  Assess: if the MEWS score is Yellow or Red  Were vital signs taken at a resting state? Yes  Focused Assessment No change from prior assessment  Early Detection of Sepsis Score *See Row Information* Low  Treat  MEWS Interventions Administered scheduled meds/treatments  Pain Scale 0-10  Pain Score 0  Pain Type Acute pain  Pain Location Shoulder  Pain Orientation Left  Pain Intervention(s) Medication (See eMAR)  Take Vital Signs  Increase Vital Sign Frequency  Yellow: Q 2hr X 2 then Q 4hr X 2, if remains yellow, continue Q 4hrs  Escalate  MEWS: Escalate Yellow: discuss with charge nurse/RN and consider discussing with provider and RRT  Notify: Charge Nurse/RN  Name of Charge Nurse/RN Notified Beverlee Nims   Date Charge Nurse/RN Notified 02/20/20  Document  Patient Outcome Other (Comment)  Progress note created (see row info) Yes  Patient B/p- Soft with High Heart Rate at baseline.

## 2020-02-20 NOTE — Progress Notes (Addendum)
PROGRESS NOTE    VARUN JOURDAN II  ZDG:[id number] DOB: [birth year] DOA: 02/18/2020 PCP: [name], MD    Brief Narrative:  74 year old male with past medical history of advanced COPD, chronic respiratory failure on 4 L of oxygen via nasal cannula, diastolic congestive heart failure (echo 10/2017 EF 65-70%), severe peripheral vascular disease (S/P endarterectomy right femoral in 2020 and left femoral 01/2020), hypertension, history of PE on chronic anticoagulation with Xarelto who presents to [facility] emergency department after being sent over by his vascular surgeon [name] for acute anemia.  Of note, patient was recently hospitalized at [facility] from 7/16-7/26 for left femoral endarterectomy due to critical limb ischemia of left lower extremity.  During that hospitalization, the patient was noted to be anemic however this was felt to be perioperative anemia.  The patient was transfused 2 units of packed red blood cells and eventually discharged home with a hemoglobin of 9.5 on 7/26.  Patient's home regimen of Xarelto 20 mg daily was resumed as well as his home regimen of aspirin 81 mg daily at time of discharge.  Patient explains that since he was discharged, he has been experiencing frequent bouts of black tarry stool.  This had been happening at least once daily.  Patient denies any associated abdominal pain, nausea, vomiting, changes in appetite.  Patient followed with [name] in clinic on 8/11 who obtained a CBC revealed a hemoglobin of 5.0.  Patient was instructed to go to [facility] emergency department for evaluation at this time.  Upon evaluation in the emergency department CBC did confirm hemoglobin of 5.0, down from 9.5 in late July.  2 units of packed red blood cells were ordered.  Coagulation profile did reveal an INR of 2.4.  80 mg of Protonix were administered.  500 cc of normal saline was administered.  The hospitalist group was then called to  assess the patient for admission to the hospital.  Patient is admitted for acute GI bleeding, has received 2 units PRBC posttransfusion hemoglobin 8.5, GI consulted.  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  Biopsy completed.  As per GI If able to hold on anticoagulation for 72 hours before reinitiation then may be most ideal.  Aspirin 81 mg can be restarted today or tomorrow to make sure some anti-PLT effect is available after recent intervention. Awaiting  Pulmonology for further recommendation.   Assessment & Plan:   Principal Problem:   Acute GI bleeding Active Problems:   Hyperlipidemia   Chronic respiratory failure with hypoxia ([place])   Essential hypertension   DNR (do not resuscitate)   Stage 4 very severe COPD by GOLD classification ([place])   PAD (peripheral artery disease) ([place])   Acute blood loss anemia   Lung infiltrate on CT   Lactic acidosis   Acute GI bleeding   Patient presenting with Hgb of 5 in the setting of an approximate 3 week history of melena suggestive of upper GI bleeding  Holding  Home regimen of Xarelto/Aspirin.  S/p 2 units PRBC transfusion , Hb 8.5   Check H and H Q6hrs, achieve target hgb of at least 7.  Considering advanced COPD and risks of sedation with EGD, I anticipate GI will try to manage conservatively for now.  Clear liquid diet  Admitted to stepdown unit  Protonix IV 80mg  x 1 followed by 40mg  IV Q12hrs  GI consulted, patient underwent EGD found to have AV  malformation and duodenitis with some oozing of blood. As per GI If able to hold on anticoagulation for 72 hours before reinitiation then may be most ideal.  Aspirin 81 mg can be restarted today or tomorrow to make sure some anti-PLT effect is available after recent intervention.   Lactic acidosis >>> resolved   Likely secondary to severe anemia  Transfusing patient  Monitor lactic acid levels to ensure  downtrending  Chronic respiratory failure with hypoxia ([facility])   Continue to provide 3 liters at rest and 4 liters with exertion to achieve target SpO2 88-92%  Stage 4 very severe COPD by GOLD classification ([place])   Continue home regimen of maintenance inhalers, PRN Duonebs  PAD (peripheral artery disease) ([facility])   Advanced disease with significant hyperemia of the distal bilateral lower extremities - I am unsure that there is any underlying cellulitis as patient/wife state this is chronic  Additionally patient has necrotic appearance of multiple toes of left foot however this has been seen by vascular 2 days ago and wife/husband state that this is stable/improved from last month .   Patient takes Xarelto for PE, will discuss with pulmonology about resumption of anticoagulation.   Lung infiltrate on CT  Patient has followed with [name] as of late for "frequent bouts of bronchitis" and has received several rounds of recent abx, last ID visit was in mid July where [name] states he felt further abx weren't indicated  CT done by [name] with Pulmonary 2 days ago revealing new multifocal infiltrates of the right lung.    That being said, patient denies changes in his chronic productive cough   In the absence of fever and mild leukocytosis (which is stable compared to prior CBC's - likely due to steriods) I am skeptical as to whether patient's new infiltrates are infection.  Will get CRP, procalcitonin, sputum and blood cultures - will start abx if any of these suggest infection  Pulmonology and infectious disease consulted, recommended patient recently completed course of antibiotics,  no need for antibiotic at this time.  History of PE: Patient was taking Xarelto before admission for history of PE which was kept on hold.  EGD shows duodenitis with some active oozing GI recommended resuming anticoagulation if this is must  Then within 12 hours with heparin without  bolus.  Discussed with pulmonology recommended holding anticoagulation for 24 hours.    DVT prophylaxis: SCDs. Code Status: DNR Family Communication:  D/W patient and his wife Disposition Plan: to be determined.  Consultants:   Infectious disease, pulmonology, gastroenterology  Procedures:  None. Antimicrobials:  Anti-infectives (From admission, onward)   Start     Dose/Rate Route Frequency Ordered Stop   02/21/20 1000  azithromycin (ZITHROMAX) tablet 250 mg     Discontinue     250 mg Oral Every M-W-F 02/18/20 2025      Subjective: Patient was seen and examined at bedside.  No overnight events.  Patient reports feeling better, patient underwent EGD found to have duodenitis with oozing,  tolerated well,  denies any pain.  Wife is at bedside. Objective: Vitals:   02/20/20 0901 02/20/20 0904 02/20/20 1118 02/20/20 1148  BP: 110/64 114/60 124/73   Pulse: (!) 107 (!) 101 (!) 102   Resp: 17 18    Temp: 98.2 F (36.8 C)  98.2 F (36.8 C)   TempSrc:   Oral   SpO2: 92% 92%  97%  Weight:      Height:  Intake/Output Summary (Last 24 hours) at 02/20/2020 1520 Last data filed at 02/20/2020 1252 Gross per 24 hour  Intake 880 ml  Output 1002 ml  Net -122 ml   Filed Weights   02/18/20 2322 02/19/20 0248 02/20/20 0340  Weight: 73.5 kg 73.9 kg 70.1 kg    Examination:  General exam: Appears calm and comfortable  Respiratory system: Clear to auscultation. Respiratory effort normal. Cardiovascular system: S1 & S2 heard, RRR. No JVD, murmurs, rubs, gallops or clicks. No pedal edema. Gastrointestinal system: Abdomen is nondistended, soft and nontender. No organomegaly or masses felt. Normal bowel sounds heard. Central nervous system: Alert and oriented. No focal neurological deficits. Extremities: No edema, no cyanosis no swelling. Skin: No rashes, lesions or ulcers Psychiatry: Judgement and insight appear normal. Mood & affect appropriate.     Data Reviewed: I have  personally reviewed following labs and imaging studies  CBC: Recent Labs  Lab 02/18/20 1645 02/19/20 0447 02/19/20 1142 02/19/20 1903 02/20/20 0627  WBC 11.6* 8.1 10.4  --  8.2  NEUTROABS 9.7*  --   --   --   --   HGB 5.0* 7.7* 8.2* 8.1* 7.8*  HCT 18.1* 24.0* 25.7* 24.9* 24.9*  MCV 92.8 87.6 86.0  --  86.5  PLT 339 251 266  --  573   Basic Metabolic Panel: Recent Labs  Lab 02/18/20 1645 02/19/20 0452 02/20/20 0627  NA 138 139 139  K 4.8 3.6 3.3*  CL 100 101 100  CO2 25 27 30   GLUCOSE 100* 82 82  BUN 23 20 13   CREATININE 1.22 1.03 0.87  CALCIUM 9.2 8.2* 8.1*  MG  --  1.8 1.9  PHOS  --   --  3.0   GFR: Estimated Creatinine Clearance: 76.1 mL/min (by C-G formula based on SCr of 0.87 mg/dL). Liver Function Tests: Recent Labs  Lab 02/18/20 1645 02/19/20 0452  AST 23 15  ALT 20 15  ALKPHOS 68 56  BILITOT 0.6 0.8  PROT 5.7* 4.8*  ALBUMIN 2.8* 2.4*   No results for input(s): LIPASE, AMYLASE in the last 168 hours. No results for input(s): AMMONIA in the last 168 hours. Coagulation Profile: Recent Labs  Lab 02/18/20 1815 02/19/20 0452  INR 2.4* 1.7*   Cardiac Enzymes: No results for input(s): CKTOTAL, CKMB, CKMBINDEX, TROPONINI in the last 168 hours. BNP (last 3 results) No results for input(s): PROBNP in the last 8760 hours. HbA1C: No results for input(s): HGBA1C in the last 72 hours. CBG: No results for input(s): GLUCAP in the last 168 hours. Lipid Profile: No results for input(s): CHOL, HDL, LDLCALC, TRIG, CHOLHDL, LDLDIRECT in the last 72 hours. Thyroid Function Tests: No results for input(s): TSH, T4TOTAL, FREET4, T3FREE, THYROIDAB in the last 72 hours. Anemia Panel: No results for input(s): VITAMINB12, FOLATE, FERRITIN, TIBC, IRON, RETICCTPCT in the last 72 hours. Sepsis Labs: Recent Labs  Lab 02/18/20 1815 02/19/20 0447  PROCALCITON  --  0.13  LATICACIDVEN 2.4* 0.6    Recent Results (from the past 240 hour(s))  SARS Coronavirus 2 by RT PCR  (hospital order, performed in [facility] hospital lab) Nasopharyngeal Nasopharyngeal Swab     Status: None   Collection Time: 02/18/20  6:10 PM   Specimen: Nasopharyngeal Swab  Result Value Ref Range Status   SARS Coronavirus 2 NEGATIVE NEGATIVE Final    Comment: (NOTE) SARS-CoV-2 target nucleic acids are NOT DETECTED.  The SARS-CoV-2 RNA is generally detectable in upper and lower respiratory specimens during the acute phase  of infection. The lowest concentration of SARS-CoV-2 viral copies this assay can detect is 250 copies / mL. A negative result does not preclude SARS-CoV-2 infection and should not be used as the sole basis for treatment or other patient management decisions.  A negative result may occur with improper specimen collection / handling, submission of specimen other than nasopharyngeal swab, presence of viral mutation(s) within the areas targeted by this assay, and inadequate number of viral copies (<250 copies / mL). A negative result must be combined with clinical observations, patient history, and epidemiological information.  Fact Sheet for Patients:   StrictlyIdeas.no  Fact Sheet for Healthcare Providers: BankingDealers.co.za  This test is not yet approved or  cleared by the [place] FDA and has been authorized for detection and/or diagnosis of SARS-CoV-2 by FDA under an Emergency Use Authorization (EUA).  This EUA will remain in effect (meaning this test can be used) for the duration of the COVID-19 declaration under Section 564(b)(1) of the Act, 21 U.S.C. section 360bbb-3(b)(1), unless the authorization is terminated or revoked sooner.  Performed at [facility], [place] [address], [place], [place] [address]   Expectorated sputum assessment w rflx to resp cult     Status: None   Collection Time: 02/19/20 12:49 AM   Specimen: Expectorated Sputum  Result Value Ref Range Status   Specimen Description  EXPECTORATED SPUTUM  Final   Special Requests NONE  Final   Sputum evaluation   Final    THIS SPECIMEN IS ACCEPTABLE FOR SPUTUM CULTURE Performed at [facility], [place] [address], [place], [place] [address]    Report Status 02/19/2020 FINAL  Final  Culture, respiratory     Status: None (Preliminary result)   Collection Time: 02/19/20 12:49 AM  Result Value Ref Range Status   Specimen Description EXPECTORATED SPUTUM  Final   Special Requests NONE Reflexed from [id number]  Final   Gram Stain   Final    RARE WBC PRESENT, PREDOMINANTLY PMN RARE SQUAMOUS EPITHELIAL CELLS PRESENT ABUNDANT GRAM NEGATIVE RODS FEW GRAM POSITIVE COCCI FEW GRAM POSITIVE RODS    Culture   Final    ABUNDANT PSEUDOMONAS AERUGINOSA SUSCEPTIBILITIES TO FOLLOW Performed at [facility], [place] [address] [place], [place] [address]    Report Status PENDING  Incomplete  Culture, blood (routine x 2)     Status: None (Preliminary result)   Collection Time: 02/19/20  4:46 AM   Specimen: BLOOD RIGHT HAND  Result Value Ref Range Status   Specimen Description BLOOD RIGHT HAND  Final   Special Requests   Final    BOTTLES DRAWN AEROBIC AND ANAEROBIC Blood Culture adequate volume   Culture   Final    NO GROWTH 1 DAY Performed at [facility], [place] [address] [place], [place] [address]    Report Status PENDING  Incomplete  Culture, blood (routine x 2)     Status: None (Preliminary result)   Collection Time: 02/19/20  4:48 AM   Specimen: BLOOD RIGHT ARM  Result Value Ref Range Status   Specimen Description BLOOD RIGHT ARM  Final   Special Requests   Final    BOTTLES DRAWN AEROBIC AND ANAEROBIC Blood Culture results may not be optimal due to an inadequate volume of blood received in culture bottles   Culture   Final    NO GROWTH 1 DAY Performed at [facility], [place] [address] [place], [place] [address]    Report Status PENDING  Incomplete    Radiology Studies: DG Chest Port 1 View  Result Date:  02/18/2020 CLINICAL DATA:  74 year old male with shortness of breath. EXAM: PORTABLE CHEST 1 VIEW COMPARISON:  Chest radiograph dated 01/27/2020. FINDINGS: There is a small right pleural effusion. Diffuse right lung interstitial coarsening and opacities most consistent with infiltrate and relative the unchanged. Areas of subpleural scarring noted in the right apex and right lung base with overall decreased volume loss in the right lung. There is background of emphysema. No pneumothorax. The cardiac silhouette is within limits. No acute osseous pathology. IMPRESSION: Small right pleural effusion and right lung infiltrate similar to prior radiograph. Electronically Signed   By: Anner Crete M.D.   On: 02/18/2020 17:07    Scheduled Meds: . [START ON 02/21/2020] azithromycin  250 mg Oral Q M,W,F  . fluticasone  2 spray Each Nare Daily  . fluticasone furoate-vilanterol  1 puff Inhalation Daily  . folic acid  1 mg Oral Daily  . furosemide  20 mg Oral Daily  . guaiFENesin  1,200 mg Oral BID  . ipratropium-albuterol  3 mL Nebulization QID  . multivitamin with minerals  1 tablet Oral Daily  . pantoprazole (PROTONIX) IV  40 mg Intravenous Q12H  . predniSONE  10 mg Oral Q breakfast  . roflumilast  500 mcg Oral Daily  . sodium chloride HYPERTONIC  4 mL Nebulization BID  . thiamine  100 mg Oral Daily   Or  . thiamine  100 mg Intravenous Daily  . umeclidinium bromide  1 puff Inhalation Daily   Continuous Infusions:   LOS: 2 days    Time spent: 35 mins.    Shawna Clamp, MD [facility]   If 7PM-7AM, please contact night-coverage

## 2020-02-20 NOTE — Anesthesia Postprocedure Evaluation (Signed)
Anesthesia Post Note  Patient: Francisco [name]  Procedure(s) Performed: ESOPHAGOGASTRODUODENOSCOPY (EGD) WITH PROPOFOL (N/A ) HOT HEMOSTASIS (ARGON PLASMA COAGULATION/BICAP) (N/A )     Patient location during evaluation: PACU Anesthesia Type: General Level of consciousness: awake and alert Pain management: pain level controlled Vital Signs Assessment: post-procedure vital signs reviewed and stable Respiratory status: spontaneous breathing, nonlabored ventilation, respiratory function stable and patient connected to nasal cannula oxygen Cardiovascular status: stable and blood pressure returned to baseline Postop Assessment: no apparent nausea or vomiting Anesthetic complications: no   No complications documented.  Last Vitals:  Vitals:   02/20/20 0901 02/20/20 0904  BP: 110/64 114/60  Pulse: (!) 107 (!) 101  Resp: 17 18  Temp: 36.8 C   SpO2: 92% 92%    Last Pain:  Vitals:   02/20/20 0901  TempSrc:   PainSc: 0-No pain                 Effie Berkshire

## 2020-02-20 NOTE — Progress Notes (Signed)
NAME:  Francisco [name], MRN:  [id number], DOB:  Nov 09, 1945, LOS: 2 ADMISSION DATE:  02/18/2020, CONSULTATION DATE:  02/19/2020 REFERRING MD:  Dr Dwyane Dee, CHIEF COMPLAINT:  Abnormal CT chest   Brief History   74 y/o M, former smoker, recently had surgery for critical limb ischemia. 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 Subsequently taken for left femoral endarterectomy with placement of Dacron patch, left common iliac artery stent and stent of left external iliac artery.  Complaining of shortness of breath Found to be anemic GI evaluation ongoing CT scan of the chest reveals infiltrative process right lung  History of present illness   74 y/o M, former smoker, with 4L O2 dependent COPD and recent treatment for pseudomonas pneumonia. Admitted with shortness of breath and weakness Found to have anemia He has advanced chronic obstructive pulmonary disease, chronic respiratory failure on 4 L of oxygen at home Diastolic heart failure Severe peripheral arterial disease s/p endarterectomy and recent left femoral patch. History of hypertension, pulmonary embolism on anticoagulation with Xarelto Sent into the hospital for further evaluation of anemia  Has had exertional dyspnea Shortness of breath with climbing steps He has a cough but not really bringing up much secretions He feels his breathing is better today  Denies any fever Denies any chills Compliant with his usual inhalers  He is on oxygen around-the-clock   Past Medical History   Past Medical History:  Diagnosis Date  . Chronic back pain    "mid and lower" (04/07/2018)  . Chronic rhinitis    -Sinus Ct 08/01/2009 >> Bilateral maxillary sinusitis with some mucosal thickeningin the sphenoid and frontal sinuses as well with air fluid levels present -chronic rhinitis flyer Aug 04, 2009  . Compressed spine fracture ([place])   . COPD (chronic obstructive pulmonary disease) [facility])    PFT's  rec Jul 17, 2009  . Emphysema lung ([place])   . On home oxygen therapy    "3L; 24/7" (04/07/2018)  . Onychomycosis    Dr. Judi Cong  . Orthostatic hypotension    "since 10/2017" (04/07/2018)  . PAD (peripheral artery disease) ([place])   . Pneumonia    "twice in 1 year" (04/07/2018)  . Pulmonary embolism ([place]) 04/07/2018  . Skin cancer    "lips, face, ears, arms" (04/07/2018)  . Vertigo    "since ~ 02/2018" (04/07/2018)   Significant Hospital Events   Anemia  S/p EGD 02/20/2020  Consults:  GI  Procedures:  Pulmonary  Significant Diagnostic Tests:  CT chest 8/11 IMPRESSION: 1. Interval development of patchy airspace consolidation in the inferior right upper lobe with new patchy airspace disease in the posterior right middle lobe and right lower lobe. Imaging features are compatible with multifocal pneumonia. Multifocal neoplasm (adenocarcinoma) considered less likely but close follow-up recommended. 2. New small right pleural effusion. 3. 8 mm posterior left lower lobe pulmonary nodule, new since prior. This may be infectious/inflammatory, but follow-up CT in 3 months recommended to ensure resolution. 4. Architectural distortion consolidative opacity in the right apex is similar to older CT of 04/07/2018 although opacity inferiorly in this region is more confluent than on the study from 2 years ago. This is likely related to evolution of scarring, but continued attention on follow-up recommended. 5. Aortic Atherosclerosis (ICD10-I70.0) and Emphysema (ICD10-J43.9).  Micro Data:  Expectorated sputum 7/22 showed Pseudomonas, Achromobacter xylosoxidanns Blood culture 02/19/2020>>  Antimicrobials:  none   Interim history/subjective:  Tolerated EGD well Breathing feels better  Objective  Blood pressure 124/73, pulse (!) 102, temperature 98.2 F (36.8 C), temperature source Oral, resp. rate 18, height 6' (1.829 m), weight 70.1 kg, SpO2 97 %.        Intake/Output Summary (Last 24  hours) at 02/20/2020 1303 Last data filed at 02/20/2020 1252 Gross per 24 hour  Intake 1120 ml  Output 1727 ml  Net -607 ml   Filed Weights   02/18/20 2322 02/19/20 0248 02/20/20 0340  Weight: 73.5 kg 73.9 kg 70.1 kg    Examination: General: Elderly, frail HENT: Moist oral mucosa Lungs: Decreased air movement bilaterally Cardiovascular: S1-S2 appreciated Abdomen: Bowel sounds appreciated Extremities: No clubbing, no edema Neuro: Alert and oriented x3 GU:   Resolved Hospital Problem list     Assessment & Plan:  Anemia -Secondary to GI bleeding -S/p EGD -Anticoagulant on hold  Stage IV chronic obstructive pulmonary disease -Bronchodilator treatments  Chronic respiratory failure -Continue oxygen supplementation  -Recent treatment for Pseudomonas pneumonia -Had 14 days of IV cefepime -Repeat culture did not reveal Pseudomonas -Recent culture in July did have Pseudomonas -Was treated recently with a course of Omnicef  Concern for recurrent infections -No leukocytosis, procalcitonin and CRP not elevated -Continue monitoring -Follow repeat cultures  Peripheral arterial disease -Stable  Abnormal CT scan of the chest likely consistent with mucous plugging -Bronchodilator treatments -Hypertonic saline -Flutter valve use -Mucinex  He appears close to his baseline Tolerated EGD well  Appointment with [name] will be rescheduled for him  Sherrilyn Rist, MD [place] [facility] Pager: [phone]

## 2020-02-20 NOTE — Transfer of Care (Signed)
Immediate Anesthesia Transfer of Care Note  Patient: JAMARIEN RODKEY II  Procedure(s) Performed: ESOPHAGOGASTRODUODENOSCOPY (EGD) WITH PROPOFOL (N/A ) HOT HEMOSTASIS (ARGON PLASMA COAGULATION/BICAP) (N/A )  Patient Location: PACU  Anesthesia Type:MAC  Level of Consciousness: drowsy  Airway & Oxygen Therapy: Patient Spontanous Breathing and Patient connected to face mask oxygen  Post-op Assessment: Report given to RN, Post -op Vital signs reviewed and stable and Patient moving all extremities  Post vital signs: Reviewed and stable  Last Vitals:  Vitals Value Taken Time  BP 109/98 02/20/20 0836  Temp    Pulse 122 02/20/20 0845  Resp 23 02/20/20 0845  SpO2 96 % 02/20/20 0845  Vitals shown include unvalidated device data.  Last Pain:  Vitals:   02/20/20 0836  TempSrc:   PainSc: 0-No pain         Complications: No complications documented.

## 2020-02-20 NOTE — Progress Notes (Signed)
Ct has findings of pneumonia since June 2021 CT but could reflect interim issues. Wil discuss during 02/21/20 visit with me. No need to call

## 2020-02-21 ENCOUNTER — Ambulatory Visit: Payer: Medicare Other | Admitting: Internal Medicine

## 2020-02-21 ENCOUNTER — Other Ambulatory Visit: Payer: Self-pay | Admitting: Physician Assistant

## 2020-02-21 ENCOUNTER — Other Ambulatory Visit: Payer: Self-pay | Admitting: *Deleted

## 2020-02-21 LAB — BLOOD CULTURE ID PANEL (REFLEXED) - BCID2

## 2020-02-21 LAB — PHOSPHORUS: Phosphorus: 2.7 mg/dL (ref 2.5–4.6)

## 2020-02-21 LAB — BASIC METABOLIC PANEL
Anion gap: 8 (ref 5–15)
BUN: 8 mg/dL (ref 8–23)
CO2: 29 mmol/L (ref 22–32)
Calcium: 7.7 mg/dL — ABNORMAL LOW (ref 8.9–10.3)
Chloride: 100 mmol/L (ref 98–111)
Creatinine, Ser: 0.84 mg/dL (ref 0.61–1.24)
GFR calc Af Amer: >60 mL/min (ref >60)
GFR calc non Af Amer: >60 mL/min (ref >60)
Glucose, Bld: 87 mg/dL (ref 70–99)
Potassium: 3.3 mmol/L — ABNORMAL LOW (ref 3.5–5.1)
Sodium: 137 mmol/L (ref 135–145)

## 2020-02-21 LAB — CBC
HCT: 24.8 % — ABNORMAL LOW (ref 39.0–52.0)
Hemoglobin: 8 g/dL — ABNORMAL LOW (ref 13.0–17.0)
MCH: 28.4 pg (ref 26.0–34.0)
MCHC: 32.3 g/dL (ref 30.0–36.0)
MCV: 87.9 fL (ref 80.0–100.0)
Platelets: 226 10*3/uL (ref 150–400)
RBC: 2.82 MIL/uL — ABNORMAL LOW (ref 4.22–5.81)
RDW: 15.5 % (ref 11.5–15.5)
WBC: 7.1 10*3/uL (ref 4.0–10.5)
nRBC: 0 % (ref 0.0–0.2)

## 2020-02-21 LAB — CULTURE, BLOOD (ROUTINE X 2): Special Requests: ADEQUATE

## 2020-02-21 LAB — HEMOGLOBIN AND HEMATOCRIT, BLOOD
HCT: 26.5 % — ABNORMAL LOW (ref 39.0–52.0)
Hemoglobin: 8.3 g/dL — ABNORMAL LOW (ref 13.0–17.0)

## 2020-02-21 LAB — HEPARIN LEVEL (UNFRACTIONATED): Heparin Unfractionated: 0.2 IU/mL — ABNORMAL LOW (ref 0.30–0.70)

## 2020-02-21 LAB — APTT: aPTT: 56 seconds — ABNORMAL HIGH (ref 24–36)

## 2020-02-21 LAB — MAGNESIUM: Magnesium: 1.9 mg/dL (ref 1.7–2.4)

## 2020-02-21 MED ORDER — CYCLOBENZAPRINE HCL 5 MG PO TABS
5.0000 mg | ORAL_TABLET | Freq: Once | ORAL | Status: AC
  Administered 2020-02-21: 5 mg via ORAL
  Filled 2020-02-21: qty 1

## 2020-02-21 MED ORDER — HEPARIN (PORCINE) 25000 UT/250ML-% IV SOLN
1200.0000 [IU]/h | INTRAVENOUS | Status: DC
  Administered 2020-02-21: 1000 [IU]/h via INTRAVENOUS
  Administered 2020-02-22 – 2020-02-23 (×2): 1200 [IU]/h via INTRAVENOUS
  Filled 2020-02-21 (×3): qty 250

## 2020-02-21 MED ORDER — POTASSIUM CHLORIDE 20 MEQ PO PACK
40.0000 meq | PACK | Freq: Once | ORAL | Status: AC
  Administered 2020-02-21: 40 meq via ORAL
  Filled 2020-02-21: qty 2

## 2020-02-21 MED ORDER — TIOTROPIUM BROMIDE MONOHYDRATE 2.5 MCG/ACT IN AERS
2.0000 | INHALATION_SPRAY | Freq: Every day | RESPIRATORY_TRACT | Status: DC
  Administered 2020-02-21 – 2020-02-25 (×4): 2 via RESPIRATORY_TRACT
  Filled 2020-02-21: qty 1

## 2020-02-21 MED ORDER — ALBUTEROL SULFATE HFA 108 (90 BASE) MCG/ACT IN AERS: INHALATION_SPRAY | RESPIRATORY_TRACT | 3 refills | Status: DC

## 2020-02-21 MED ORDER — ATORVASTATIN CALCIUM 40 MG PO TABS
40.0000 mg | ORAL_TABLET | Freq: Every day | ORAL | Status: DC
  Administered 2020-02-21 – 2020-02-24 (×4): 40 mg via ORAL
  Filled 2020-02-21 (×4): qty 1

## 2020-02-21 MED ORDER — PANTOPRAZOLE SODIUM 40 MG PO TBEC
40.0000 mg | DELAYED_RELEASE_TABLET | Freq: Two times a day (BID) | ORAL | Status: DC
  Administered 2020-02-22 – 2020-02-25 (×7): 40 mg via ORAL
  Filled 2020-02-21 (×7): qty 1

## 2020-02-21 MED ORDER — IPRATROPIUM-ALBUTEROL 0.5-2.5 (3) MG/3ML IN SOLN
3.0000 mL | Freq: Two times a day (BID) | RESPIRATORY_TRACT | Status: DC
  Administered 2020-02-21 – 2020-02-25 (×8): 3 mL via RESPIRATORY_TRACT
  Filled 2020-02-21 (×8): qty 3

## 2020-02-21 MED ORDER — BUDESONIDE-FORMOTEROL FUMARATE 160-4.5 MCG/ACT IN AERO
2.0000 | INHALATION_SPRAY | Freq: Two times a day (BID) | RESPIRATORY_TRACT | Status: DC
  Administered 2020-02-21 – 2020-02-25 (×8): 2 via RESPIRATORY_TRACT
  Filled 2020-02-21: qty 6

## 2020-02-21 MED ORDER — SODIUM CHLORIDE 0.9 % IV SOLN
125.0000 mg | Freq: Once | INTRAVENOUS | Status: AC
  Administered 2020-02-21: 125 mg via INTRAVENOUS
  Filled 2020-02-21: qty 10

## 2020-02-21 NOTE — Progress Notes (Signed)
Pt's dressing to right arm and right foot has been changed.

## 2020-02-21 NOTE — Progress Notes (Signed)
Daily Rounding Note  02/21/2020, 12:00 PM  LOS: 3 days   SUBJECTIVE:   Chief complaint:  Anemia.  Dark, FOBT + stools.       Pt having some sore throat after EGD yesterday, not severe.  2 BM's yest, small BM today.  He did not look at it Heparin gtt started this AM.   RN just now hanging IV ferric gluconate.   C/o of cramps in hands, fingers, this is an intermittent issue.    OBJECTIVE:         Vital signs in last 24 hours:    Temp:  [97.7 F (36.5 C)-98.6 F (37 C)] 97.7 F (36.5 C) (08/16 1118) Pulse Rate:  [95-110] 108 (08/16 1118) Resp:  [15-22] 18 (08/16 1118) BP: (98-135)/(59-80) 105/65 (08/16 1118) SpO2:  [96 %-100 %] 100 % (08/16 1118) Weight:  [66.8 kg] 66.8 kg (08/16 0325) Last BM Date: 02/21/20 Filed Weights   02/19/20 0248 02/20/20 0340 02/21/20 0325  Weight: 73.9 kg 70.1 kg 66.8 kg   General: looks chronically unwell.  Alert, comfortable   Heart: RRR Chest: + phlegmatic cough, some dyspnea.  ronchi and diminished BS in bases Abdomen: soft, ND, NT.  Active BS  Extremities: no CCE Neuro/Psych:  Pleasant, fluid speech.  Moves all 4s.    Intake/Output from previous day: 08/15 0701 - 08/16 0700 In: 1100 [P.O.:700; I.V.:400] Out: 1552 [Urine:1550; Blood:2]  Intake/Output this shift: Total I/O In: 360 [P.O.:360] Out: -   Lab Results: Recent Labs    02/19/20 1142 02/19/20 1142 02/19/20 1903 02/20/20 0627 02/21/20 0406  WBC 10.4  --   --  8.2 7.1  HGB 8.2*   < > 8.1* 7.8* 8.0*  HCT 25.7*   < > 24.9* 24.9* 24.8*  PLT 266  --   --  243 226   < > = values in this interval not displayed.   BMET Recent Labs    02/19/20 0452 02/20/20 0627 02/21/20 0406  NA 139 139 137  K 3.6 3.3* 3.3*  CL 101 100 100  CO2 27 30 29   GLUCOSE 82 82 87  BUN 20 13 8   CREATININE 1.03 0.87 0.84  CALCIUM 8.2* 8.1* 7.7*   LFT Recent Labs    02/18/20 1645 02/19/20 0452  PROT 5.7* 4.8*  ALBUMIN 2.8* 2.4*    AST 23 15  ALT 20 15  ALKPHOS 68 56  BILITOT 0.6 0.8   PT/INR Recent Labs    02/18/20 1815 02/19/20 0452  LABPROT 25.1* 18.9*  INR 2.4* 1.7*   Hepatitis Panel No results for input(s): HEPBSAG, HCVAB, HEPAIGM, HEPBIGM in the last 72 hours.  Studies/Results: No results found.   Scheduled Meds: . atorvastatin  40 mg Oral Daily  . azithromycin  250 mg Oral Q M,W,F  . budesonide-formoterol  2 puff Inhalation BID  . fluticasone  2 spray Each Nare Daily  . folic acid  1 mg Oral Daily  . furosemide  20 mg Oral Daily  . guaiFENesin  1,200 mg Oral BID  . ipratropium-albuterol  3 mL Nebulization BID  . multivitamin with minerals  1 tablet Oral Daily  . pantoprazole (PROTONIX) IV  40 mg Intravenous Q12H  . predniSONE  10 mg Oral Q breakfast  . roflumilast  500 mcg Oral Daily  . sodium chloride HYPERTONIC  4 mL Nebulization BID  . thiamine  100 mg Oral Daily   Or  . thiamine  100 mg  Intravenous Daily  . Tiotropium Bromide Monohydrate  2 puff Inhalation Daily   Continuous Infusions: . ferric gluconate (FERRLECIT/NULECIT) IV    . heparin 1,000 Units/hr (02/21/20 1047)   PRN Meds:.acetaminophen **OR** acetaminophen, cycloSPORINE, LORazepam **OR** LORazepam, ondansetron **OR** ondansetron (ZOFRAN) IV, sodium chloride HYPERTONIC, traMADol   ASSESMENT:   *  Acute on chronic [place] anemia.  Dark stools.  In setting Xarelto, ASA.   Hgb 5 >> 2 PRBCs >> 8.   Also transfused 2 PRBCs after 7/19 LE endarterectomy.   IV ferric gluconate infusing now.   8/15 EGD: HH.  Duodenitis w active oozing, treated w APC and endoclips.  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 Blood at duodenal bulb and D2.   No PPI PTA, now on bid IV Protonix.    *   Adenomatous Colon polyps 2011 and 2009, Dr Earlean Shawl.    *  Chronic Xarelto for hx PE.   PVD.  S/p L femoral endarterectomy 7/19.   IV heparin started this AM.     *   Advanced, O2 requiring COPD.   Lung infiltrate on imaging.  ID has  not felt these were infectious and no further abx warranted, multiple previous courses of abx   PLAN   *   Lifelong PPI.  Switch to po Protonix 40 mg bid tomorrow. In 1 month, drop dose to 40 mg/day.    *  Watch for re-bleeding and declinein Hgb, if none: add xarelto and then ASA.    *   No plans presently to pursue colonoscopy.   This surveillance colonoscopy can be pursued in future weeks/months depending on pt's overall clinical status.      Azucena Freed  02/21/2020, 12:00 PM Phone [phone]

## 2020-02-21 NOTE — Telephone Encounter (Signed)
Pt's wife called today 8/16 letting us know that pt was in the hospital and his appt was cancelled. We will get pt scheduled for a hospital follow up once pt is discharged. Routing to myself to keep an eye out for.

## 2020-02-21 NOTE — Progress Notes (Signed)
PHARMACY - PHYSICIAN COMMUNICATION CRITICAL VALUE ALERT - BLOOD CULTURE IDENTIFICATION (BCID)  Francisco [name] is an 74 y.o. male who presented to [facility] on 02/18/2020 with a chief complaint of AMS/weakness, anemia and GI bleed.   Assessment:   1/2 blood cultures growing Staph species. WBC wnl, afebrile and PCT normal.  Likely contaminant   Name of physician (or Provider) Contacted:  Dr. Marlowe Sax   Current antibiotics:  None  Changes to prescribed antibiotics recommended:   None at this time   Results for orders placed or performed during the hospital encounter of 02/18/20  Blood Culture ID Panel (Reflexed) (Collected: 02/19/2020  4:46 AM)  Result Value Ref Range   Enterococcus faecalis NOT DETECTED NOT DETECTED   Enterococcus Faecium NOT DETECTED NOT DETECTED   Listeria monocytogenes NOT DETECTED NOT DETECTED   Staphylococcus species DETECTED (A) NOT DETECTED   Staphylococcus aureus (BCID) NOT DETECTED NOT DETECTED   Staphylococcus epidermidis NOT DETECTED NOT DETECTED   Staphylococcus lugdunensis NOT DETECTED NOT DETECTED   Streptococcus species NOT DETECTED NOT DETECTED   Streptococcus agalactiae NOT DETECTED NOT DETECTED   Streptococcus pneumoniae NOT DETECTED NOT DETECTED   Streptococcus pyogenes NOT DETECTED NOT DETECTED   A.calcoaceticus-baumannii NOT DETECTED NOT DETECTED   Bacteroides fragilis NOT DETECTED NOT DETECTED   Enterobacterales NOT DETECTED NOT DETECTED   Enterobacter cloacae complex NOT DETECTED NOT DETECTED   Escherichia coli NOT DETECTED NOT DETECTED   Klebsiella aerogenes NOT DETECTED NOT DETECTED   Klebsiella oxytoca NOT DETECTED NOT DETECTED   Klebsiella pneumoniae NOT DETECTED NOT DETECTED   Proteus species NOT DETECTED NOT DETECTED   Salmonella species NOT DETECTED NOT DETECTED   Serratia marcescens NOT DETECTED NOT DETECTED   Haemophilus influenzae NOT DETECTED NOT DETECTED   Neisseria meningitidis NOT DETECTED NOT DETECTED   Pseudomonas  aeruginosa NOT DETECTED NOT DETECTED   Stenotrophomonas maltophilia NOT DETECTED NOT DETECTED   Candida albicans NOT DETECTED NOT DETECTED   Candida auris NOT DETECTED NOT DETECTED   Candida glabrata NOT DETECTED NOT DETECTED   Candida krusei NOT DETECTED NOT DETECTED   Candida parapsilosis NOT DETECTED NOT DETECTED   Candida tropicalis NOT DETECTED NOT DETECTED   Cryptococcus neoformans/gattii NOT DETECTED NOT DETECTED    [name] 02/21/2020  5:52 AM

## 2020-02-21 NOTE — Progress Notes (Addendum)
Talent for heparin Indication: small PE  12/15/19  Allergies  Allergen Reactions  . Tape Other (See Comments)    SKIN IS VERY THIN, TEARS SKIN; CAN ONLY USE COBAN WRAPS DUE TO CONDITION OF SKIN!!  . Ciprofloxacin Nausea Only    Sick on stomach, weak/tired  . Levaquin [Levofloxacin] Other (See Comments)    hallucinations    Patient Measurements: Height: 6' (182.9 cm) Weight: 66.8 kg (147 lb 4.3 oz) IBW/kg (Calculated) : 77.6 Heparin Dosing Weight: 66 kg  Vital Signs: Temp: 98.2 F (36.8 C) (08/16 1655) Temp Source: Oral (08/16 1655) BP: 121/68 (08/16 1655) Pulse Rate: 100 (08/16 1655)  Labs: Recent Labs    02/19/20 0447 02/19/20 0452 02/19/20 1142 02/19/20 1903 02/20/20 0627 02/20/20 0627 02/21/20 0406 02/21/20 1745  HGB   < >  --  8.2*   < > 7.8*   < > 8.0* 8.3*  HCT   < >  --  25.7*   < > 24.9*  --  24.8* 26.5*  PLT   < >  --  266  --  243  --  226  --   APTT  --  32  --   --   --   --   --  56*  LABPROT  --  18.9*  --   --   --   --   --   --   INR  --  1.7*  --   --   --   --   --   --   HEPARINUNFRC  --   --   --   --   --   --   --  0.20*  CREATININE  --  1.03  --   --  0.87  --  0.84  --    < > = values in this interval not displayed.    Estimated Creatinine Clearance: 75.1 mL/min (by C-G formula based on SCr of 0.84 mg/dL).   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 Last dose of rivaroxaban 8/13 7am.  Pharmacy consulted for heparin while rivaroxaban is on hold.   Initial aPTT and heparin level both subtherapeutic and correlating. Will increase infusion rate and dose via heparin levels. PM H/H stable, per RN only mild oozing at IV sites since starting heparin.  Goal of Therapy:  Heparin level 0.3-0.7 units/ml Monitor platelets by anticoagulation protocol: Yes   Plan:  -Increase heparin  to 1150 units/h -Recheck heparin level in [place], PharmD, BCPS Clinical Pharmacist [phone] Please check AMION for all [place] numbers 02/21/2020

## 2020-02-21 NOTE — Progress Notes (Signed)
PT has been complaining of hand cramps all day. Notified MD and got an one time order of flexeril.

## 2020-02-21 NOTE — Progress Notes (Signed)
ANTICOAGULATION CONSULT NOTE - Initial Consult  Pharmacy Consult for heparin Indication: small PE  12/15/19  Allergies  Allergen Reactions  . Tape Other (See Comments)    SKIN IS VERY THIN, TEARS SKIN; CAN ONLY USE COBAN WRAPS DUE TO CONDITION OF SKIN!!  . Ciprofloxacin Nausea Only    Sick on stomach, weak/tired  . Levaquin [Levofloxacin] Other (See Comments)    hallucinations    Patient Measurements: Height: 6' (182.9 cm) Weight: 66.8 kg (147 lb 4.3 oz) IBW/kg (Calculated) : 77.6 Heparin Dosing Weight: 66 kg  Vital Signs: Temp: 98.6 F (37 C) (08/16 0758) Temp Source: Oral (08/16 0758) BP: 135/80 (08/16 0758) Pulse Rate: 96 (08/16 0758)  Labs: Recent Labs    02/18/20 1815 02/19/20 0447 02/19/20 0452 02/19/20 1142 02/19/20 1142 02/19/20 1903 02/19/20 1903 02/20/20 0627 02/21/20 0406  HGB  --    < >  --  8.2*   < > 8.1*   < > 7.8* 8.0*  HCT  --    < >  --  25.7*   < > 24.9*  --  24.9* 24.8*  PLT  --    < >  --  266  --   --   --  243 226  APTT  --   --  32  --   --   --   --   --   --   LABPROT 25.1*  --  18.9*  --   --   --   --   --   --   INR 2.4*  --  1.7*  --   --   --   --   --   --   CREATININE  --   --  1.03  --   --   --   --  0.87 0.84   < > = values in this interval not displayed.    Estimated Creatinine Clearance: 75.1 mL/min (by C-G formula based on SCr of 0.84 mg/dL).   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 Last dose of rivaroxaban 8/13 7am.  Pharmacy consulted for heparin while rivaroxaban is on hold.   No HL drawn this admission. Will check aPTT in case rivaroxaban is still affecting HL and f/u correlation.   Goal of Therapy:  Heparin level 0.3-0.7 units/ml Monitor platelets by anticoagulation protocol: Yes   Plan:  Start heparin 1000 units/hr, no bolus per MD  F/u 8hr aPTT/HL F/u aPTT until correlates with  heparin level  Monitor daily aPTT, HL, CBC/plt Monitor for signs/symptoms of bleeding  F/u rivaroxaban restart   Benetta Spar, PharmD, BCPS, [facility] Clinical Pharmacist  Please check AMION for all [place] phone numbers After 10:00 PM, call [place]

## 2020-02-21 NOTE — Progress Notes (Signed)
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 Oneida [name] for acute anemia.  Of note, patient was recently hospitalized at [facility] from 7/16-7/26 for left femoral endarterectomy due to critical limb ischemia of left lower extremity. During that hospitalization, the patient was noted to be anemic however this was felt to be perioperative anemia.  The patient was transfused 2 units of packed red blood cells and eventually discharged home with a hemoglobin of 9.5 on 7/26.  Patient's home regimen of Xarelto 20 mg daily was resumed as well as his home regimen of aspirin 81 mg daily at time of discharge.  Patient explains that since he was discharged, he has been experiencing frequent bouts of black tarry stool.  This had been happening at least once daily.  Patient denies any associated abdominal pain, nausea, vomiting, changes in appetite.  Patient followed with Dr. Oneida [name] in clinic on 8/11 who obtained a CBC revealed a hemoglobin of 5.0.  Patient was instructed to go to [facility] emergency department for evaluation at this time.  Upon evaluation in the emergency department CBC did confirm hemoglobin of 5.0, down from 9.5 in late July.  2 units of packed red blood cells were ordered.  Coagulation profile did reveal an INR of 2.4.  80 mg of Protonix were administered.  500 cc of normal saline was administered.  The hospitalist group was then called to  assess the patient for admission to the hospital.  Patient is admitted for acute GI bleeding, has received 2 units PRBC posttransfusion hemoglobin 8.5, GI consulted.  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  Biopsy completed.  As per GI If able to hold on anticoagulation for 72 hours before reinitiation then may be most ideal.  Aspirin 81 mg can be restarted today or tomorrow to make sure some anti-PLT effect is available after recent intervention. Anticoagulation is started with heparin with no bolus.  Assessment & Plan:   Principal Problem:   Acute GI bleeding Active Problems:   Hyperlipidemia   Chronic respiratory failure with hypoxia ([place])   Essential hypertension   DNR (do not resuscitate)   Stage 4 very severe COPD by GOLD classification ([place])   PAD (peripheral artery disease) ([place])   Acute blood loss anemia   Lung infiltrate on CT   Lactic acidosis   Acute GI bleeding -    Patient presenting with Hgb of 5 in the setting of an approximate 3 week history of melena suggestive of upper GI bleeding  Holding  Home regimen of Xarelto/Aspirin.  S/p 2 units PRBC transfusion , Hb 8.5   Check H and H Q6hrs, achieve target hgb of at least 7.  Considering advanced COPD and risks of sedation with EGD, I anticipate GI will try to manage conservatively for now.  Clear liquid diet  Admitted to stepdown unit  Protonix IV 80mg  x 1 followed by 40mg  IV Q12hrs  GI consulted, patient underwent EGD found to  have AV malformation and duodenitis with active oozing of blood. As per GI If able to hold on anticoagulation for 72 hours before reinitiation then may be most ideal.  Aspirin 81 mg can be restarted today or tomorrow to make sure some anti-PLT effect is available after recent intervention.  - Discussed with pulmonology and as per GI recommendation started on anticoagulation within 24 hours after EGD with no bolus. -We will continue  to monitor for bleeding.  Lactic acidosis >>> resolved   Likely secondary to severe anemia  Monitor lactic acid levels to ensure down trending- Resolved.  Chronic respiratory failure with hypoxia ([facility])   Continue to provide 3 liters at rest and 4 liters with exertion to achieve target SpO2 88-92%  Stage 4 very severe COPD by GOLD classification ([place])   Continue home regimen of maintenance inhalers, PRN Duonebs  PAD (peripheral artery disease) ([facility])   Advanced disease with significant hyperemia of the distal bilateral lower extremities - I am unsure that there is any underlying cellulitis as patient/wife state this is chronic  Additionally patient has necrotic appearance of multiple toes of left foot however this has been seen by vascular 2 days ago and wife/husband state that this is stable/improved from last month .   Patient takes Xarelto for PE, will discuss with pulmonology about resumption of anticoagulation.      Discussed with pulmonology and started patient on anticoagulation.  Lung infiltrate on CT  Patient has followed with [name] as of late for "frequent bouts of bronchitis" and has received several rounds of recent abx, last ID visit was in mid July where [name] states he felt further abx weren't indicated  CT done by [name] with Pulmonary 2 days ago revealing new multifocal infiltrates of the right lung.    That being said, patient denies changes in his chronic productive cough   In the absence of fever and mild leukocytosis (which is stable compared to prior CBC's - likely due to steriods) I am skeptical as to whether patient's new infiltrates are infection.  Will get CRP, procalcitonin, sputum and blood cultures - will start abx if any of these suggest infection  Pulmonology and infectious disease consulted, recommended patient recently completed course of antibiotics,  no need for antibiotic at this time.  History of PE: Patient  was taking Xarelto before admission for history of PE which was kept on hold.  EGD shows duodenitis with some active oozing GI recommended resuming anticoagulation if this is must Then within 12 hours with heparin without bolus.  Discussed with pulmonology recommended resuming anticoagulation within 24 hours.  Started patient on heparin with no bolus   DVT prophylaxis: Heparin GTT. Code Status: DNR Family Communication:  D/W patient and his wife Disposition Plan: to be determined.  Consultants:   Infectious disease, pulmonology, gastroenterology  Procedures:  None. Antimicrobials:  Anti-infectives (From admission, onward)   Start     Dose/Rate Route Frequency Ordered Stop   02/21/20 1000  azithromycin (ZITHROMAX) tablet 250 mg     Discontinue     250 mg Oral Every M-W-F 02/18/20 2025      Subjective: Patient was seen and examined at bedside.  No overnight events.  Patient reports feeling better, Patient underwent EGD found to have duodenitis with oozing,  tolerated well,  denies any pain.  He has tolerated soft bland diet.  Denies nausea and vomiting.  Wife is at bedside.  All questions answered. Objective: Vitals:   02/20/20 2039 02/20/20  2115 02/21/20 0003 02/21/20 0325  BP: 116/72  128/67 122/68  Pulse: 95 95 97 (!) 102  Resp: 16 (!) 22 15 19   Temp: 98 F (36.7 C)  97.8 F (36.6 C) 98 F (36.7 C)  TempSrc: Oral  Oral Oral  SpO2: 99% 100% 100% 98%  Weight:    66.8 kg  Height:        Intake/Output Summary (Last 24 hours) at 02/21/2020 0750 Last data filed at 02/21/2020 0350 Gross per 24 hour  Intake 1100 ml  Output 1552 ml  Net -452 ml   Filed Weights   02/19/20 0248 02/20/20 0340 02/21/20 0325  Weight: 73.9 kg 70.1 kg 66.8 kg    Examination:  General exam: Appears calm and comfortable  Respiratory system: Clear to auscultation. Respiratory effort normal. Cardiovascular system: S1 & S2 heard, RRR. No JVD, murmurs, rubs, gallops or clicks. No pedal  edema. Gastrointestinal system: Abdomen is nondistended, soft and nontender. No organomegaly or masses felt. Normal bowel sounds heard. Central nervous system: Alert and oriented. No focal neurological deficits. Extremities: No edema, no cyanosis no swelling. Skin: No rashes, lesions or ulcers Psychiatry: Judgement and insight appear normal. Mood & affect appropriate.     Data Reviewed: I have personally reviewed following labs and imaging studies  CBC: Recent Labs  Lab 02/18/20 1645 02/18/20 1645 02/19/20 0447 02/19/20 1142 02/19/20 1903 02/20/20 0627 02/21/20 0406  WBC 11.6*  --  8.1 10.4  --  8.2 7.1  NEUTROABS 9.7*  --   --   --   --   --   --   HGB 5.0*   < > 7.7* 8.2* 8.1* 7.8* 8.0*  HCT 18.1*   < > 24.0* 25.7* 24.9* 24.9* 24.8*  MCV 92.8  --  87.6 86.0  --  86.5 87.9  PLT 339  --  251 266  --  243 226   < > = values in this interval not displayed.   Basic Metabolic Panel: Recent Labs  Lab 02/18/20 1645 02/19/20 0452 02/20/20 0627 02/21/20 0406  NA 138 139 139 137  K 4.8 3.6 3.3* 3.3*  CL 100 101 100 100  CO2 25 27 30 29   GLUCOSE 100* 82 82 87  BUN 23 20 13 8   CREATININE 1.22 1.03 0.87 0.84  CALCIUM 9.2 8.2* 8.1* 7.7*  MG  --  1.8 1.9 1.9  PHOS  --   --  3.0 2.7   GFR: Estimated Creatinine Clearance: 75.1 mL/min (by C-G formula based on SCr of 0.84 mg/dL). Liver Function Tests: Recent Labs  Lab 02/18/20 1645 02/19/20 0452  AST 23 15  ALT 20 15  ALKPHOS 68 56  BILITOT 0.6 0.8  PROT 5.7* 4.8*  ALBUMIN 2.8* 2.4*   No results for input(s): LIPASE, AMYLASE in the last 168 hours. No results for input(s): AMMONIA in the last 168 hours. Coagulation Profile: Recent Labs  Lab 02/18/20 1815 02/19/20 0452  INR 2.4* 1.7*   Cardiac Enzymes: No results for input(s): CKTOTAL, CKMB, CKMBINDEX, TROPONINI in the last 168 hours. BNP (last 3 results) No results for input(s): PROBNP in the last 8760 hours. HbA1C: No results for input(s): HGBA1C in the last  72 hours. CBG: No results for input(s): GLUCAP in the last 168 hours. Lipid Profile: No results for input(s): CHOL, HDL, LDLCALC, TRIG, CHOLHDL, LDLDIRECT in the last 72 hours. Thyroid Function Tests: No results for input(s): TSH, T4TOTAL, FREET4, T3FREE, THYROIDAB in the last 72 hours. Anemia Panel: No results  for input(s): VITAMINB12, FOLATE, FERRITIN, TIBC, IRON, RETICCTPCT in the last 72 hours. Sepsis Labs: Recent Labs  Lab 02/18/20 1815 02/19/20 0447  PROCALCITON  --  0.13  LATICACIDVEN 2.4* 0.6    Recent Results (from the past 240 hour(s))  SARS Coronavirus 2 by RT PCR (hospital order, performed in [facility] hospital lab) Nasopharyngeal Nasopharyngeal Swab     Status: None   Collection Time: 02/18/20  6:10 PM   Specimen: Nasopharyngeal Swab  Result Value Ref Range Status   SARS Coronavirus 2 NEGATIVE NEGATIVE Final    Comment: (NOTE) SARS-CoV-2 target nucleic acids are NOT DETECTED.  The SARS-CoV-2 RNA is generally detectable in upper and lower respiratory specimens during the acute phase of infection. The lowest concentration of SARS-CoV-2 viral copies this assay can detect is 250 copies / mL. A negative result does not preclude SARS-CoV-2 infection and should not be used as the sole basis for treatment or other patient management decisions.  A negative result may occur with improper specimen collection / handling, submission of specimen other than nasopharyngeal swab, presence of viral mutation(s) within the areas targeted by this assay, and inadequate number of viral copies (<250 copies / mL). A negative result must be combined with clinical observations, patient history, and epidemiological information.  Fact Sheet for Patients:   StrictlyIdeas.no  Fact Sheet for Healthcare Providers: BankingDealers.co.za  This test is not yet approved or  cleared by the [place] FDA and has been authorized for detection  and/or diagnosis of SARS-CoV-2 by FDA under an Emergency Use Authorization (EUA).  This EUA will remain in effect (meaning this test can be used) for the duration of the COVID-19 declaration under Section 564(b)(1) of the Act, 21 U.S.C. section 360bbb-3(b)(1), unless the authorization is terminated or revoked sooner.  Performed at [facility], [place] [address] [place], [place] [address]   Expectorated sputum assessment w rflx to resp cult     Status: None   Collection Time: 02/19/20 12:49 AM   Specimen: Expectorated Sputum  Result Value Ref Range Status   Specimen Description EXPECTORATED SPUTUM  Final   Special Requests NONE  Final   Sputum evaluation   Final    THIS SPECIMEN IS ACCEPTABLE FOR SPUTUM CULTURE Performed at [facility], [place] [address] [place], [place] [address]    Report Status 02/19/2020 FINAL  Final  Culture, respiratory     Status: None (Preliminary result)   Collection Time: 02/19/20 12:49 AM  Result Value Ref Range Status   Specimen Description EXPECTORATED SPUTUM  Final   Special Requests NONE Reflexed from [id number]  Final   Gram Stain   Final    RARE WBC PRESENT, PREDOMINANTLY PMN RARE SQUAMOUS EPITHELIAL CELLS PRESENT ABUNDANT GRAM NEGATIVE RODS FEW GRAM POSITIVE COCCI FEW GRAM POSITIVE RODS    Culture   Final    ABUNDANT PSEUDOMONAS AERUGINOSA SUSCEPTIBILITIES TO FOLLOW Performed at [facility], [place] [address], [place], [place] [address]    Report Status PENDING  Incomplete  Culture, blood (routine x 2)     Status: None (Preliminary result)   Collection Time: 02/19/20  4:46 AM   Specimen: BLOOD RIGHT HAND  Result Value Ref Range Status   Specimen Description BLOOD RIGHT HAND  Final   Special Requests   Final    BOTTLES DRAWN AEROBIC AND ANAEROBIC Blood Culture adequate volume   Culture  Setup Time   Final    GRAM POSITIVE COCCI IN CLUSTERS AEROBIC BOTTLE  ONLY CRITICAL RESULT CALLED TO, READ BACK BY AND VERIFIED WITH: Salli Real [id number] 02/21/2020 Mena Goes Performed at [facility], [place] [address], [place], [place] [address]    Culture GRAM POSITIVE COCCI  Final   Report Status PENDING  Incomplete  Blood Culture ID Panel (Reflexed)     Status: Abnormal   Collection Time: 02/19/20  4:46 AM  Result Value Ref Range Status   Enterococcus faecalis NOT DETECTED NOT DETECTED Final   Enterococcus Faecium NOT DETECTED NOT DETECTED Final   Listeria monocytogenes NOT DETECTED NOT DETECTED Final   Staphylococcus species DETECTED (A) NOT DETECTED Final    Comment: CRITICAL RESULT CALLED TO, READ BACK BY AND VERIFIED WITH: G. ABBOTT,PHARMD 0546 02/21/2020 T. TYSOR    Staphylococcus aureus (BCID) NOT DETECTED NOT DETECTED Final   Staphylococcus epidermidis NOT DETECTED NOT DETECTED Final   Staphylococcus lugdunensis NOT DETECTED NOT DETECTED Final   Streptococcus species NOT DETECTED NOT DETECTED Final   Streptococcus agalactiae NOT DETECTED NOT DETECTED Final   Streptococcus pneumoniae NOT DETECTED NOT DETECTED Final   Streptococcus pyogenes NOT DETECTED NOT DETECTED Final   A.calcoaceticus-baumannii NOT DETECTED NOT DETECTED Final   Bacteroides fragilis NOT DETECTED NOT DETECTED Final   Enterobacterales NOT DETECTED NOT DETECTED Final   Enterobacter cloacae complex NOT DETECTED NOT DETECTED Final   Escherichia coli NOT DETECTED NOT DETECTED Final   Klebsiella aerogenes NOT DETECTED NOT DETECTED Final   Klebsiella oxytoca NOT DETECTED NOT DETECTED Final   Klebsiella pneumoniae NOT DETECTED NOT DETECTED Final   Proteus species NOT DETECTED NOT DETECTED Final   Salmonella species NOT DETECTED NOT DETECTED Final   Serratia marcescens NOT DETECTED NOT DETECTED Final   Haemophilus influenzae NOT DETECTED NOT DETECTED Final   Neisseria meningitidis NOT DETECTED NOT DETECTED Final   Pseudomonas aeruginosa NOT DETECTED NOT DETECTED Final   Stenotrophomonas maltophilia NOT DETECTED NOT DETECTED Final   Candida  albicans NOT DETECTED NOT DETECTED Final   Candida auris NOT DETECTED NOT DETECTED Final   Candida glabrata NOT DETECTED NOT DETECTED Final   Candida krusei NOT DETECTED NOT DETECTED Final   Candida parapsilosis NOT DETECTED NOT DETECTED Final   Candida tropicalis NOT DETECTED NOT DETECTED Final   Cryptococcus neoformans/gattii NOT DETECTED NOT DETECTED Final    Comment: Performed at [facility], [address] [place], [place] [address]  Culture, blood (routine x 2)     Status: None (Preliminary result)   Collection Time: 02/19/20  4:48 AM   Specimen: BLOOD RIGHT ARM  Result Value Ref Range Status   Specimen Description BLOOD RIGHT ARM  Final   Special Requests   Final    BOTTLES DRAWN AEROBIC AND ANAEROBIC Blood Culture results may not be optimal due to an inadequate volume of blood received in culture bottles   Culture   Final    NO GROWTH 2 DAYS Performed at [facility], [place] [address] [place], [place] [address]    Report Status PENDING  Incomplete    Radiology Studies: No results found.  Scheduled Meds: . azithromycin  250 mg Oral Q M,W,F  . fluticasone  2 spray Each Nare Daily  . fluticasone furoate-vilanterol  1 puff Inhalation Daily  . folic acid  1 mg Oral Daily  . furosemide  20 mg Oral Daily  . guaiFENesin  1,200 mg Oral BID  . ipratropium-albuterol  3 mL Nebulization QID  . multivitamin with minerals  1 tablet Oral Daily  .  pantoprazole (PROTONIX) IV  40 mg Intravenous Q12H  . potassium chloride  40 mEq Oral Once  . predniSONE  10 mg Oral Q breakfast  . roflumilast  500 mcg Oral Daily  . sodium chloride HYPERTONIC  4 mL Nebulization BID  . thiamine  100 mg Oral Daily   Or  . thiamine  100 mg Intravenous Daily  . umeclidinium bromide  1 puff Inhalation Daily   Continuous Infusions:   LOS: 3 days    Time spent: 35 mins.    Shawna Clamp, MD [facility]   If 7PM-7AM, please contact night-coverage

## 2020-02-22 ENCOUNTER — Encounter (HOSPITAL_COMMUNITY): Payer: Self-pay | Admitting: Gastroenterology

## 2020-02-22 LAB — CBC
HCT: 24.6 % — ABNORMAL LOW (ref 39.0–52.0)
Hemoglobin: 7.6 g/dL — ABNORMAL LOW (ref 13.0–17.0)
MCH: 27.3 pg (ref 26.0–34.0)
MCHC: 30.9 g/dL (ref 30.0–36.0)
MCV: 88.5 fL (ref 80.0–100.0)
Platelets: 218 10*3/uL (ref 150–400)
RBC: 2.78 MIL/uL — ABNORMAL LOW (ref 4.22–5.81)
RDW: 15.7 % — ABNORMAL HIGH (ref 11.5–15.5)
WBC: 7.9 10*3/uL (ref 4.0–10.5)
nRBC: 0 % (ref 0.0–0.2)

## 2020-02-22 LAB — BASIC METABOLIC PANEL
Anion gap: 8 (ref 5–15)
BUN: 8 mg/dL (ref 8–23)
CO2: 27 mmol/L (ref 22–32)
Calcium: 7.8 mg/dL — ABNORMAL LOW (ref 8.9–10.3)
Chloride: 104 mmol/L (ref 98–111)
Creatinine, Ser: 0.82 mg/dL (ref 0.61–1.24)
GFR calc Af Amer: >60 mL/min (ref >60)
GFR calc non Af Amer: >60 mL/min (ref >60)
Glucose, Bld: 94 mg/dL (ref 70–99)
Potassium: 3.3 mmol/L — ABNORMAL LOW (ref 3.5–5.1)
Sodium: 139 mmol/L (ref 135–145)

## 2020-02-22 LAB — HEMOGLOBIN AND HEMATOCRIT, BLOOD
HCT: 27.3 % — ABNORMAL LOW (ref 39.0–52.0)
Hemoglobin: 8.4 g/dL — ABNORMAL LOW (ref 13.0–17.0)

## 2020-02-22 LAB — PHOSPHORUS: Phosphorus: 1.7 mg/dL — ABNORMAL LOW (ref 2.5–4.6)

## 2020-02-22 LAB — MAGNESIUM: Magnesium: 1.8 mg/dL (ref 1.7–2.4)

## 2020-02-22 LAB — HEPARIN LEVEL (UNFRACTIONATED): Heparin Unfractionated: 0.31 IU/mL (ref 0.30–0.70)

## 2020-02-22 LAB — SURGICAL PATHOLOGY

## 2020-02-22 MED ORDER — POTASSIUM CHLORIDE 20 MEQ PO PACK
40.0000 meq | PACK | Freq: Once | ORAL | Status: AC
  Administered 2020-02-22: 40 meq via ORAL
  Filled 2020-02-22: qty 2

## 2020-02-22 MED ORDER — K PHOS MONO-SOD PHOS DI & MONO 155-852-130 MG PO TABS
250.0000 mg | ORAL_TABLET | Freq: Three times a day (TID) | ORAL | Status: AC
  Administered 2020-02-22 – 2020-02-23 (×6): 250 mg via ORAL
  Filled 2020-02-22 (×6): qty 1

## 2020-02-22 NOTE — Progress Notes (Signed)
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 for acute anemia.  Of note, patient was recently hospitalized at [facility] from 7/16-7/26 for left femoral endarterectomy due to critical limb ischemia of left lower extremity. During that hospitalization, the patient was noted to be anemic however this was felt to be perioperative anemia.  The patient was transfused 2 units of packed red blood cells and eventually discharged home with a hemoglobin of 9.5 on 7/26.  Patient's home regimen of Xarelto 20 mg daily was resumed as well as his home regimen of aspirin 81 mg daily at time of discharge. Patient explains that since he was discharged, he has been experiencing frequent bouts of black tarry stool.  This had been happening at least once daily.  Patient denies any associated abdominal pain, nausea, vomiting, changes in appetite.Patient followed with [name] in clinic on 8/11 who obtained a CBC revealed a hemoglobin of 5.0.  Patient was instructed to go to [facility] emergency department for evaluation at this time.  Patient is admitted for acute GI bleeding / Melena, He has received 2 units PRBC posttransfusion hemoglobin 8.5, GI consulted.  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  Biopsy completed.  Given history of PE anticoagulation is started with heparin within 24 hours, plan is to resume  Xarelto tomorrow within 72 hours and later aspirin.  Monitor H&H.  Assessment & Plan:   Principal Problem:   Acute GI bleeding Active Problems:   Hyperlipidemia   Chronic respiratory failure with hypoxia ([place])   Essential hypertension   DNR (do not resuscitate)   Stage 4 very severe COPD by GOLD classification ([place])   PAD (peripheral artery disease) ([place])   Acute blood loss anemia   Lung infiltrate on CT   Lactic acidosis   Acute GI bleeding -secondary to AVM /duodenitis.   Patient presenting with Hgb of 5 in the setting of an approximate 3 week history of melena suggestive of upper GI bleeding  Holding  Home regimen of Xarelto/Aspirin.  S/p 2 units PRBC transfusion , Hb 8.5   Check H and H Q6hrs, achieve target hgb of at least 7.  Admitted to stepdown unit  Protonix IV 80mg  x 1 followed by 40mg  IV Q12hrs.  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  Discussed with pulmonology and per GI recommendation anticoagulation started with heparin within 24 hours.  Per GI resume Xarelto within 72 hours and later aspirin while monitoring H&H.  Patient should follow-up with outpatient GI.  Continue to monitor H&H every 8hs   Lactic acidosis >>> resolved   Likely secondary to severe anemia.  Monitor lactic acid levels to ensure down trending- Resolved.  Chronic respiratory failure with hypoxia (H[facility])   Continue to provide 3 liters at rest and 4 liters with exertion to achieve target SpO2 88-92%  Stage 4 very severe COPD by GOLD classification ([place])  Continue home regimen of maintenance inhalers, PRN Duonebs  PAD (peripheral artery disease) (H[facility])   Advanced disease with significant hyperemia of the distal bilateral lower extremities -  Additionally patient has necrotic appearance of multiple toes of left foot however this has been seen by vascular 2 days ago and wife/husband state that this is  stable/improved from last month. .   Patient takes Xarelto for PE, it is on hold.     Discussed with pulmonology and started patient on anticoagulation.  Lung infiltrate on CT  Patient has followed with [name] as of late for "frequent bouts of bronchitis" and has received several rounds of recent abx, last ID visit was in mid July where [name] states he felt further abx weren't indicated  CT done by [name] with Pulmonary 2 days ago revealing new multifocal infiltrates of the right lung.    That being said, patient denies changes in his chronic productive cough   In the absence of fever and mild leukocytosis (which is stable compared to prior CBC's - likely due to steriods)  Pulmonology and infectious disease consulted, recommended patient recently completed course of antibiotics,  no need for antibiotic at this time.  History of PE:  Patient was taking Xarelto before admission for history of PE which was kept on hold.   EGD shows duodenitis with some active oozing. Discussed with GI and pulmonology patient resumed on anticoagulation with heparin. GI recommended resume Xarelto within 72 hours and aspirin later, continue to monitor H&H.  DVT prophylaxis: Heparin GTT. Code Status: DNR Family Communication:  D/W patient and his wife at bedside. Disposition Plan: to be determined.  Consultants:   Infectious disease, pulmonology, gastroenterology  Procedures:  None. Antimicrobials:  Anti-infectives (From admission, onward)   Start     Dose/Rate Route Frequency Ordered Stop   02/21/20 1000  azithromycin (ZITHROMAX) tablet 250 mg     Discontinue     250 mg Oral Every M-W-F 02/18/20 2025      Subjective: Patient was seen and examined at bedside.  No overnight events.  Patient reports feeling better,  He has tolerated soft bland diet.  Denies nausea and vomiting.  Wife is at bedside.  All questions answered. Objective: Vitals:   02/22/20 0010 02/22/20 0407 02/22/20  0447 02/22/20 0737  BP: 137/70  122/63 97/68  Pulse: (!) 104  (!) 105 (!) 121  Resp: 18  18 20   Temp: 98.1 F (36.7 C)  (!) 97.3 F (36.3 C) 98.6 F (37 C)  TempSrc: Oral  Oral Oral  SpO2: 95% 96% 99% 97%  Weight: 72.5 kg     Height:        Intake/Output Summary (Last 24 hours) at 02/22/2020 0806 Last data filed at 02/22/2020 0651 Gross per 24 hour  Intake 925.44 ml  Output 975 ml  Net -49.56 ml   Filed Weights   02/20/20 0340 02/21/20 0325 02/22/20 0010  Weight: 70.1 kg 66.8 kg 72.5 kg    Examination:  General exam: Appears calm and comfortable  Respiratory system: Clear to auscultation. Respiratory effort normal. Cardiovascular system: S1 & S2 heard, RRR. No JVD, murmurs, rubs, gallops or clicks. No pedal edema. Gastrointestinal system: Abdomen is nondistended, soft and nontender. No organomegaly or masses felt. Normal bowel sounds heard. Central nervous system: Alert and oriented. No focal neurological deficits. Extremities: No edema, no cyanosis no swelling. Skin: No rashes, lesions or ulcers Psychiatry: Judgement and insight appear normal. Mood & affect appropriate.  Data Reviewed: I have personally reviewed following labs and imaging studies  CBC: Recent Labs  Lab 02/18/20 1645 02/18/20 1645 02/19/20 0447 02/19/20 0447 02/19/20 1142 02/19/20 1142 02/19/20 1903 02/20/20 0627 02/21/20 0406 02/21/20 1745 02/22/20 0429  WBC 11.6*   < > 8.1  --  10.4  --   --  8.2 7.1  --  7.9  NEUTROABS 9.7*  --   --   --   --   --   --   --   --   --   --   HGB 5.0*   < > 7.7*   < > 8.2*   < > 8.1* 7.8* 8.0* 8.3* 7.6*  HCT 18.1*   < > 24.0*   < > 25.7*   < > 24.9* 24.9* 24.8* 26.5* 24.6*  MCV 92.8   < > 87.6  --  86.0  --   --  86.5 87.9  --  88.5  PLT 339   < > 251  --  266  --   --  243 226  --  218   < > = values in this interval not displayed.   Basic Metabolic Panel: Recent Labs  Lab 02/18/20 1645 02/19/20 0452 02/20/20 0627 02/21/20 0406 02/22/20 0429    NA 138 139 139 137 139  K 4.8 3.6 3.3* 3.3* 3.3*  CL 100 101 100 100 104  CO2 25 27 30 29 27   GLUCOSE 100* 82 82 87 94  BUN 23 20 13 8 8   CREATININE 1.22 1.03 0.87 0.84 0.82  CALCIUM 9.2 8.2* 8.1* 7.7* 7.8*  MG  --  1.8 1.9 1.9 1.8  PHOS  --   --  3.0 2.7 1.7*   GFR: Estimated Creatinine Clearance: 83.5 mL/min (by C-G formula based on SCr of 0.82 mg/dL). Liver Function Tests: Recent Labs  Lab 02/18/20 1645 02/19/20 0452  AST 23 15  ALT 20 15  ALKPHOS 68 56  BILITOT 0.6 0.8  PROT 5.7* 4.8*  ALBUMIN 2.8* 2.4*   No results for input(s): LIPASE, AMYLASE in the last 168 hours. No results for input(s): AMMONIA in the last 168 hours. Coagulation Profile: Recent Labs  Lab 02/18/20 1815 02/19/20 0452  INR 2.4* 1.7*   Cardiac Enzymes: No results for input(s): CKTOTAL, CKMB, CKMBINDEX, TROPONINI in the last 168 hours. BNP (last 3 results) No results for input(s): PROBNP in the last 8760 hours. HbA1C: No results for input(s): HGBA1C in the last 72 hours. CBG: No results for input(s): GLUCAP in the last 168 hours. Lipid Profile: No results for input(s): CHOL, HDL, LDLCALC, TRIG, CHOLHDL, LDLDIRECT in the last 72 hours. Thyroid Function Tests: No results for input(s): TSH, T4TOTAL, FREET4, T3FREE, THYROIDAB in the last 72 hours. Anemia Panel: No results for input(s): VITAMINB12, FOLATE, FERRITIN, TIBC, IRON, RETICCTPCT in the last 72 hours. Sepsis Labs: Recent Labs  Lab 02/18/20 1815 02/19/20 0447  PROCALCITON  --  0.13  LATICACIDVEN 2.4* 0.6    Recent Results (from the past 240 hour(s))  SARS Coronavirus 2 by RT PCR (hospital order, performed in [facility] hospital lab) Nasopharyngeal Nasopharyngeal Swab     Status: None   Collection Time: 02/18/20  6:10 PM   Specimen: Nasopharyngeal Swab  Result Value Ref Range Status   SARS Coronavirus 2 NEGATIVE NEGATIVE Final    Comment: (NOTE) SARS-CoV-2 target nucleic acids are NOT DETECTED.  The SARS-CoV-2 RNA is  generally detectable in upper and lower respiratory specimens during the acute phase  of infection. The lowest concentration of SARS-CoV-2 viral copies this assay can detect is 250 copies / mL. A negative result does not preclude SARS-CoV-2 infection and should not be used as the sole basis for treatment or other patient management decisions.  A negative result may occur with improper specimen collection / handling, submission of specimen other than nasopharyngeal swab, presence of viral mutation(s) within the areas targeted by this assay, and inadequate number of viral copies (<250 copies / mL). A negative result must be combined with clinical observations, patient history, and epidemiological information.  Fact Sheet for Patients:   StrictlyIdeas.no  Fact Sheet for Healthcare Providers: BankingDealers.co.za  This test is not yet approved or  cleared by the [place] FDA and has been authorized for detection and/or diagnosis of SARS-CoV-2 by FDA under an Emergency Use Authorization (EUA).  This EUA will remain in effect (meaning this test can be used) for the duration of the COVID-19 declaration under Section 564(b)(1) of the Act, 21 U.S.C. section 360bbb-3(b)(1), unless the authorization is terminated or revoked sooner.  Performed at [facility], [place] [address], [place], [place] [address]   Expectorated sputum assessment w rflx to resp cult     Status: None   Collection Time: 02/19/20 12:49 AM   Specimen: Expectorated Sputum  Result Value Ref Range Status   Specimen Description EXPECTORATED SPUTUM  Final   Special Requests NONE  Final   Sputum evaluation   Final    THIS SPECIMEN IS ACCEPTABLE FOR SPUTUM CULTURE Performed at [facility], [place] [address] [place], [place] [address]    Report Status 02/19/2020 FINAL  Final  Culture, respiratory     Status: None (Preliminary result)   Collection Time: 02/19/20 12:49  AM  Result Value Ref Range Status   Specimen Description EXPECTORATED SPUTUM  Final   Special Requests NONE Reflexed from [id number]  Final   Gram Stain   Final    RARE WBC PRESENT, PREDOMINANTLY PMN RARE SQUAMOUS EPITHELIAL CELLS PRESENT ABUNDANT GRAM NEGATIVE RODS FEW GRAM POSITIVE COCCI FEW GRAM POSITIVE RODS    Culture   Final    ABUNDANT PSEUDOMONAS AERUGINOSA CULTURE REINCUBATED FOR BETTER GROWTH Performed at [facility], [place] [address], [place], [place] [address]    Report Status PENDING  Incomplete  Culture, blood (routine x 2)     Status: Abnormal   Collection Time: 02/19/20  4:46 AM   Specimen: BLOOD RIGHT HAND  Result Value Ref Range Status   Specimen Description BLOOD RIGHT HAND  Final   Special Requests   Final    BOTTLES DRAWN AEROBIC AND ANAEROBIC Blood Culture adequate volume   Culture  Setup Time   Final    GRAM POSITIVE COCCI IN CLUSTERS AEROBIC BOTTLE ONLY CRITICAL RESULT CALLED TO, READ BACK BY AND VERIFIED WITH: G. ABBOTT,PHARMD [id number] 02/21/2020 T. TYSOR    Culture (A)  Final    STAPHYLOCOCCUS CAPITIS THE SIGNIFICANCE OF ISOLATING THIS ORGANISM FROM A SINGLE SET OF BLOOD CULTURES WHEN MULTIPLE SETS ARE DRAWN IS UNCERTAIN. PLEASE NOTIFY THE MICROBIOLOGY DEPARTMENT WITHIN ONE WEEK IF SPECIATION AND SENSITIVITIES ARE REQUIRED. Performed at [facility], [place] [address] [place], [place] [address]    Report Status 02/21/2020 FINAL  Final  Blood Culture ID Panel (Reflexed)     Status: Abnormal   Collection Time: 02/19/20  4:46 AM  Result Value Ref Range Status   Enterococcus faecalis NOT DETECTED NOT DETECTED Final   Enterococcus  Faecium NOT DETECTED NOT DETECTED Final   Listeria monocytogenes NOT DETECTED NOT DETECTED Final   Staphylococcus species DETECTED (A) NOT DETECTED Final    Comment: CRITICAL RESULT CALLED TO, READ BACK BY AND VERIFIED WITH: G. ABBOTT,PHARMD [id number] 02/21/2020 T. TYSOR    Staphylococcus aureus (BCID) NOT DETECTED NOT DETECTED  Final   Staphylococcus epidermidis NOT DETECTED NOT DETECTED Final   Staphylococcus lugdunensis NOT DETECTED NOT DETECTED Final   Streptococcus species NOT DETECTED NOT DETECTED Final   Streptococcus agalactiae NOT DETECTED NOT DETECTED Final   Streptococcus pneumoniae NOT DETECTED NOT DETECTED Final   Streptococcus pyogenes NOT DETECTED NOT DETECTED Final   A.calcoaceticus-baumannii NOT DETECTED NOT DETECTED Final   Bacteroides fragilis NOT DETECTED NOT DETECTED Final   Enterobacterales NOT DETECTED NOT DETECTED Final   Enterobacter cloacae complex NOT DETECTED NOT DETECTED Final   Escherichia coli NOT DETECTED NOT DETECTED Final   Klebsiella aerogenes NOT DETECTED NOT DETECTED Final   Klebsiella oxytoca NOT DETECTED NOT DETECTED Final   Klebsiella pneumoniae NOT DETECTED NOT DETECTED Final   Proteus species NOT DETECTED NOT DETECTED Final   Salmonella species NOT DETECTED NOT DETECTED Final   Serratia marcescens NOT DETECTED NOT DETECTED Final   Haemophilus influenzae NOT DETECTED NOT DETECTED Final   Neisseria meningitidis NOT DETECTED NOT DETECTED Final   Pseudomonas aeruginosa NOT DETECTED NOT DETECTED Final   Stenotrophomonas maltophilia NOT DETECTED NOT DETECTED Final   Candida albicans NOT DETECTED NOT DETECTED Final   Candida auris NOT DETECTED NOT DETECTED Final   Candida glabrata NOT DETECTED NOT DETECTED Final   Candida krusei NOT DETECTED NOT DETECTED Final   Candida parapsilosis NOT DETECTED NOT DETECTED Final   Candida tropicalis NOT DETECTED NOT DETECTED Final   Cryptococcus neoformans/gattii NOT DETECTED NOT DETECTED Final    Comment: Performed at [facility], [address] [place], [place] [address]  Culture, blood (routine x 2)     Status: None (Preliminary result)   Collection Time: 02/19/20  4:48 AM   Specimen: BLOOD RIGHT ARM  Result Value Ref Range Status   Specimen Description BLOOD RIGHT ARM  Final   Special Requests   Final    BOTTLES DRAWN  AEROBIC AND ANAEROBIC Blood Culture results may not be optimal due to an inadequate volume of blood received in culture bottles   Culture   Final    NO GROWTH 3 DAYS Performed at [facility], [place] [address] [place],  [address]    Report Status PENDING  Incomplete    Radiology Studies: No results found.  Scheduled Meds: . atorvastatin  40 mg Oral Daily  . azithromycin  250 mg Oral Q M,W,F  . budesonide-formoterol  2 puff Inhalation BID  . fluticasone  2 spray Each Nare Daily  . folic acid  1 mg Oral Daily  . furosemide  20 mg Oral Daily  . guaiFENesin  1,200 mg Oral BID  . ipratropium-albuterol  3 mL Nebulization BID  . multivitamin with minerals  1 tablet Oral Daily  . pantoprazole  40 mg Oral BID  . phosphorus  250 mg Oral TID  . potassium chloride  40 mEq Oral Once  . predniSONE  10 mg Oral Q breakfast  . roflumilast  500 mcg Oral Daily  . sodium chloride HYPERTONIC  4 mL Nebulization BID  . thiamine  100 mg Oral Daily   Or  . thiamine  100 mg Intravenous Daily  . Tiotropium Bromide Monohydrate  2  puff Inhalation Daily   Continuous Infusions: . heparin 1,150 Units/hr (02/21/20 1820)     LOS: 4 days    Time spent: 25 mins.    Shawna Clamp, MD [facility]   If 7PM-7AM, please contact night-coverage

## 2020-02-22 NOTE — TOC Progression Note (Signed)
Transition of Care [facility]) - Progression Note    Patient Details  Name: Francisco [name] MRN: [id number] Date of Birth: 08/14/1945  Transition of Care [facility]) CM/SW Contact  Zenon Mayo, RN Phone Number: 02/22/2020, 3:47 PM  Clinical Narrative:    NCM gave patient substance abuse resources, NCM asked MD for HHRN,HHPT orders, he is active with [facility] and would like to continue. He states he has transportation home at dc and he has home oxygen which will be in the car at dc.         Expected Discharge Plan and Services                                                 Social Determinants of Health (SDOH) Interventions    Readmission Risk Interventions Readmission Risk Prevention Plan 01/31/2020  Transportation Screening Complete  PCP or Specialist Appt within 5-7 Days Complete  Home Care Screening Complete  Medication Review (RN CM) Complete  Some recent data might be hidden

## 2020-02-22 NOTE — Progress Notes (Signed)
Yellow [place] score pt sitting on the side of bed.

## 2020-02-22 NOTE — Progress Notes (Signed)
[place] for heparin Indication: small PE  12/15/19  Allergies  Allergen Reactions  . Tape Other (See Comments)    SKIN IS VERY THIN, TEARS SKIN; CAN ONLY USE COBAN WRAPS DUE TO CONDITION OF SKIN!!  . Ciprofloxacin Nausea Only    Sick on stomach, weak/tired  . Levaquin [Levofloxacin] Other (See Comments)    hallucinations    Patient Measurements: Height: 6' (182.9 cm) Weight: 72.5 kg (159 lb 13.3 oz) IBW/kg (Calculated) : 77.6 Heparin Dosing Weight: 66 kg  Vital Signs: Temp: 97.3 F (36.3 C) (08/17 0447) Temp Source: Oral (08/17 0447) BP: 122/63 (08/17 0447) Pulse Rate: 105 (08/17 0447)  Labs: Recent Labs    02/20/20 0627 02/20/20 0627 02/21/20 0406 02/21/20 0406 02/21/20 1745 02/22/20 0429  HGB 7.8*   < > 8.0*   < > 8.3* 7.6*  HCT 24.9*   < > 24.8*  --  26.5* 24.6*  PLT 243  --  226  --   --  218  APTT  --   --   --   --  56*  --   HEPARINUNFRC  --   --   --   --  0.20* 0.31  CREATININE 0.87  --  0.84  --   --  0.82   < > = values in this interval not displayed.    Estimated Creatinine Clearance: 83.5 mL/min (by C-G formula based on SCr of 0.82 mg/dL).   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 Last dose of rivaroxaban 8/13 7am.  Pharmacy consulted for heparin while rivaroxaban is on hold.   HL 0.31 just therapeutic. H/H slight drop, plt stable. Spoke with patient, had some skin tears with oozing now wrapped, no blood in stool.    Goal of Therapy:  Heparin level 0.3-0.7 units/ml Monitor platelets by anticoagulation protocol: Yes   Plan:  Increase heparin to 1200 units/hr Monitor daily HL, CBC/plt Monitor for signs/symptoms of bleeding  F/u restart rivaroxaban  Benetta Spar, PharmD, BCPS, BCCP Clinical Pharmacist  Please check AMION for all [place] phone numbers After 10:00 PM, call [place]

## 2020-02-23 DIAGNOSIS — R0602 Shortness of breath: Secondary | ICD-10-CM

## 2020-02-23 DIAGNOSIS — D649 Anemia, unspecified: Secondary | ICD-10-CM

## 2020-02-23 DIAGNOSIS — E785 Hyperlipidemia, unspecified: Secondary | ICD-10-CM

## 2020-02-23 LAB — CULTURE, RESPIRATORY W GRAM STAIN

## 2020-02-23 LAB — BASIC METABOLIC PANEL
Anion gap: 7 (ref 5–15)
BUN: 6 mg/dL — ABNORMAL LOW (ref 8–23)
CO2: 29 mmol/L (ref 22–32)
Calcium: 7.7 mg/dL — ABNORMAL LOW (ref 8.9–10.3)
Chloride: 105 mmol/L (ref 98–111)
Creatinine, Ser: 0.92 mg/dL (ref 0.61–1.24)
GFR calc Af Amer: >60 mL/min (ref >60)
GFR calc non Af Amer: >60 mL/min (ref >60)
Glucose, Bld: 114 mg/dL — ABNORMAL HIGH (ref 70–99)
Potassium: 3.1 mmol/L — ABNORMAL LOW (ref 3.5–5.1)
Sodium: 141 mmol/L (ref 135–145)

## 2020-02-23 LAB — CBC
HCT: 26.8 % — ABNORMAL LOW (ref 39.0–52.0)
Hemoglobin: 7.9 g/dL — ABNORMAL LOW (ref 13.0–17.0)
MCH: 27.1 pg (ref 26.0–34.0)
MCHC: 29.5 g/dL — ABNORMAL LOW (ref 30.0–36.0)
MCV: 91.8 fL (ref 80.0–100.0)
Platelets: 190 10*3/uL (ref 150–400)
RBC: 2.92 MIL/uL — ABNORMAL LOW (ref 4.22–5.81)
RDW: 15.9 % — ABNORMAL HIGH (ref 11.5–15.5)
WBC: 8.6 10*3/uL (ref 4.0–10.5)
nRBC: 0 % (ref 0.0–0.2)

## 2020-02-23 LAB — MAGNESIUM: Magnesium: 1.9 mg/dL (ref 1.7–2.4)

## 2020-02-23 LAB — HEPARIN LEVEL (UNFRACTIONATED): Heparin Unfractionated: 0.37 IU/mL (ref 0.30–0.70)

## 2020-02-23 LAB — PHOSPHORUS: Phosphorus: 2.1 mg/dL — ABNORMAL LOW (ref 2.5–4.6)

## 2020-02-23 MED ORDER — APIXABAN 5 MG PO TABS
5.0000 mg | ORAL_TABLET | Freq: Two times a day (BID) | ORAL | Status: DC
  Administered 2020-02-23 (×2): 5 mg via ORAL
  Filled 2020-02-23 (×2): qty 1

## 2020-02-23 MED ORDER — POTASSIUM CHLORIDE CRYS ER 20 MEQ PO TBCR
40.0000 meq | EXTENDED_RELEASE_TABLET | Freq: Once | ORAL | Status: AC
  Administered 2020-02-23: 40 meq via ORAL
  Filled 2020-02-23: qty 2

## 2020-02-23 NOTE — Progress Notes (Signed)
MEWS escalated to yellow, pt up and down to bedside commode HR elevates. Well continue to monitor.

## 2020-02-23 NOTE — Progress Notes (Signed)
PROGRESS NOTE    Francisco [name]  POE:[id number] DOB: Nov 29, 1945 DOA: 02/18/2020 PCP: Francisco Olp, MD   Brief Narrative:  HPI on 02/18/2020 by Dr. 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 Oneida [name] for acute anemia.  Of note, patient was recently hospitalized at [facility] from 7/16-7/26 for left femoral endarterectomy due to critical limb ischemia of left lower extremity.  During that hospitalization, the patient was noted to be anemic however this was felt to be perioperative anemia.  The patient was transfused 2 units of packed red blood cells and eventually discharged home with a hemoglobin of 9.5 on 7/26.  Patient's home regimen of Xarelto 20 mg daily was resumed as well as his home regimen of aspirin 81 mg daily at time of discharge.  Patient explains that since he was discharged, he has been experiencing frequent bouts of black tarry stool.  This had been happening at least once daily.  Patient denies any associated abdominal pain, nausea, vomiting, changes in appetite.  In the weeks that followed, patient developed progressively worsening generalized weakness, shortness of breath beyond his baseline and lightheadedness.  The patient has become so weak that he requires the assistance of his family to even get out of his bed or chair where at baseline he is typically able to ambulate on his own.  Patient followed with Dr. Oneida [name] in clinic on 8/11 who obtained a CBC revealed a hemoglobin of 5.0.  Patient was instructed to go to [facility] emergency  department for evaluation at this time.  Upon evaluation in the emergency department CBC did confirm hemoglobin of 5.0, down from 9.5 in late July.  2 units of packed red blood cells were ordered.  Coagulation profile did reveal an INR of 2.4.  80 mg of Protonix were administered.  500 cc of normal saline was administered.  The hospitalist group was then called to assess the patient for admission to the hospital.  Interim history [name] for GI bleeding received 2 units PRBC.  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  Patient with a history of PE and was placed on Xarelto and aspirin prior to admission.  Will restart Eliquis today and monitor patient's hemoglobin.  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 Chase [name], patient's pulmonologist-will start patient on Eliquis  Peripheral arterial disease -Advance as you significant hyperemia of the distal bilateral lower extremities -Additionally patient with a chronic appearance of  multiple toes on left foot -Was seen by vascular 2 days prior and is noted to be stable -As above we will start Eliquis.  Will hold off on restarting aspirin for now  Lung infiltrate on CT -Patient has followed with an infectious disease in the past, Dr. Megan [name] for frequent bouts of bronchitis.  Last visit was in mid July 2021 at that point antibiotics were not indicated -Patient does follow with Dr. Chase [name], pulmonology. -Does have chronic cough which is productive however has not had any changes  Hypokalemia/hypophosphatemia -Continue to monitor and replace as needed  DVT Prophylaxis  Heparin --> Eliquis  Code Status: DNR  Family Communication: Wife at bedside  Disposition Plan:  Status is: Inpatient  Remains inpatient appropriate because:Continue to monitor H/H given restarting of anticoagulation.    Dispo: [name]              Anticipated d/c is to: Home              Anticipated d/c date is: 2 days              Patient currently is not medically stable to d/c.   Consultants Gastroenterology Pulmonology Infectious disease  Procedures  EGD  Antibiotics   Anti-infectives (From admission, onward)   Start     Dose/Rate Route Frequency Ordered Stop   02/21/20 1000  azithromycin (ZITHROMAX) tablet 250 mg     Discontinue     250 mg Oral Every M-W-F 02/18/20 2025        Subjective:   Francisco [name] seen and examined today.  Patient feels weak and somewhat short of breath.  Patient's wife also states he has been more short of breath recently.  She is concerned about him going home too soon.  He currently denies any further dark stools at this time.  Denies chest pain, abdominal pain, nausea, vomiting, diarrhea or constipation, dizziness or headache.  Objective:   Vitals:   02/23/20 0025 02/23/20 0327 02/23/20 0730 02/23/20 1231  BP: 131/69 (!) 144/69  104/65  Pulse: (!) 101 (!) 101  (!) 113  Resp: 20 18  18   Temp: 98.3 F (36.8 C) 98.7 F (37.1  C)  99.1 F (37.3 C)  TempSrc: Oral Oral  Oral  SpO2: 97% 97% 95% 100%  Weight:  70.1 kg    Height:        Intake/Output Summary (Last 24 hours) at 02/23/2020 1607 Last data filed at 02/23/2020 1300 Gross per 24 hour  Intake 1347.9 ml  Output 300 ml  Net 1047.9 ml   Filed Weights   02/21/20 0325 02/22/20 0010 02/23/20 0327  Weight: 66.8 kg 72.5 kg 70.1 kg    Exam  General: Well developed, chronically ill-appearing, NAD  HEENT: NCAT, mucous membranes moist.   Cardiovascular: S1 S2 auscultated, tachycardic  Respiratory: Diminished breath sounds, increased work of breathing  Abdomen: Soft, nontender, nondistended, + bowel sounds  Extremities: warm dry without cyanosis clubbing or edema  Neuro: AAOx3, nonfocal  Psych: appropriate mood and affect   Data Reviewed: I have personally reviewed following labs and imaging studies  CBC: Recent Labs  Lab 02/18/20 1645 02/19/20 0447 02/19/20 1142 02/19/20 1903 02/20/20 0627 02/20/20 0627 02/21/20 0406 02/21/20 1745 02/22/20 0429 02/22/20 1426 02/23/20 0911  WBC 11.6*   < >  10.4  --  8.2  --  7.1  --  7.9  --  8.6  NEUTROABS 9.7*  --   --   --   --   --   --   --   --   --   --   HGB 5.0*   < > 8.2*   < > 7.8*   < > 8.0* 8.3* 7.6* 8.4* 7.9*  HCT 18.1*   < > 25.7*   < > 24.9*   < > 24.8* 26.5* 24.6* 27.3* 26.8*  MCV 92.8   < > 86.0  --  86.5  --  87.9  --  88.5  --  91.8  PLT 339   < > 266  --  243  --  226  --  218  --  190   < > = values in this interval not displayed.   Basic Metabolic Panel: Recent Labs  Lab 02/19/20 0452 02/20/20 0627 02/21/20 0406 02/22/20 0429 02/23/20 0911  NA 139 139 137 139 141  K 3.6 3.3* 3.3* 3.3* 3.1*  CL 101 100 100 104 105  CO2 27 30 29 27 29   GLUCOSE 82 82 87 94 114*  BUN 20 13 8 8  6*  CREATININE 1.03 0.87 0.84 0.82 0.92  CALCIUM 8.2* 8.1* 7.7* 7.8* 7.7*  MG 1.8 1.9 1.9 1.8 1.9  PHOS  --  3.0 2.7 1.7* 2.1*   GFR: Estimated Creatinine Clearance: 72 mL/min (by C-G  formula based on SCr of 0.92 mg/dL). Liver Function Tests: Recent Labs  Lab 02/18/20 1645 02/19/20 0452  AST 23 15  ALT 20 15  ALKPHOS 68 56  BILITOT 0.6 0.8  PROT 5.7* 4.8*  ALBUMIN 2.8* 2.4*   No results for input(s): LIPASE, AMYLASE in the last 168 hours. No results for input(s): AMMONIA in the last 168 hours. Coagulation Profile: Recent Labs  Lab 02/18/20 1815 02/19/20 0452  INR 2.4* 1.7*   Cardiac Enzymes: No results for input(s): CKTOTAL, CKMB, CKMBINDEX, TROPONINI in the last 168 hours. BNP (last 3 results) No results for input(s): PROBNP in the last 8760 hours. HbA1C: No results for input(s): HGBA1C in the last 72 hours. CBG: No results for input(s): GLUCAP in the last 168 hours. Lipid Profile: No results for input(s): CHOL, HDL, LDLCALC, TRIG, CHOLHDL, LDLDIRECT in the last 72 hours. Thyroid Function Tests: No results for input(s): TSH, T4TOTAL, FREET4, T3FREE, THYROIDAB in the last 72 hours. Anemia Panel: No results for input(s): VITAMINB12, FOLATE, FERRITIN, TIBC, IRON, RETICCTPCT in the last 72 hours. Urine analysis:    Component Value Date/Time   COLORURINE YELLOW 04/15/2019 1530   APPEARANCEUR CLEAR 04/15/2019 1530   LABSPEC 1.012 04/15/2019 1530   PHURINE 7.0 04/15/2019 1530   GLUCOSEU NEGATIVE 04/15/2019 1530   GLUCOSEU NEGATIVE 06/25/2017 0831   HGBUR SMALL (A) 04/15/2019 1530   HGBUR trace-lysed 02/12/2010 0819   BILIRUBINUR NEGATIVE 04/15/2019 1530   BILIRUBINUR Negative 12/25/2017 0852   KETONESUR NEGATIVE 04/15/2019 1530   PROTEINUR NEGATIVE 04/15/2019 1530   UROBILINOGEN 0.2 12/25/2017 0852   UROBILINOGEN 0.2 06/25/2017 0831   NITRITE NEGATIVE 04/15/2019 1530   LEUKOCYTESUR NEGATIVE 04/15/2019 1530   Sepsis Labs: @LABRCNTIP (procalcitonin:4,lacticidven:4)  ) Recent Results (from the past 240 hour(s))  SARS Coronavirus 2 by RT PCR (hospital order, performed in [facility] hospital lab) Nasopharyngeal Nasopharyngeal Swab     Status: None    Collection Time: 02/18/20  6:10 PM   Specimen: Nasopharyngeal Swab  Result Value Ref Range  Status   SARS Coronavirus 2 NEGATIVE NEGATIVE Final    Comment: (NOTE) SARS-CoV-2 target nucleic acids are NOT DETECTED.  The SARS-CoV-2 RNA is generally detectable in upper and lower respiratory specimens during the acute phase of infection. The lowest concentration of SARS-CoV-2 viral copies this assay can detect is 250 copies / mL. A negative result does not preclude SARS-CoV-2 infection and should not be used as the sole basis for treatment or other patient management decisions.  A negative result may occur with improper specimen collection / handling, submission of specimen other than nasopharyngeal swab, presence of viral mutation(s) within the areas targeted by this assay, and inadequate number of viral copies (<250 copies / mL). A negative result must be combined with clinical observations, patient history, and epidemiological information.  Fact Sheet for Patients:   StrictlyIdeas.no  Fact Sheet for Healthcare Providers: BankingDealers.co.za  This test is not yet approved or  cleared by the [place] FDA and has been authorized for detection and/or diagnosis of SARS-CoV-2 by FDA under an Emergency Use Authorization (EUA).  This EUA will remain in effect (meaning this test can be used) for the duration of the COVID-19 declaration under Section 564(b)(1) of the Act, 21 U.S.C. section 360bbb-3(b)(1), unless the authorization is terminated or revoked sooner.  Performed at [facility], [place] [address] [place], [place] [address]   Expectorated sputum assessment w rflx to resp cult     Status: None   Collection Time: 02/19/20 12:49 AM   Specimen: Expectorated Sputum  Result Value Ref Range Status   Specimen Description EXPECTORATED SPUTUM  Final   Special Requests NONE  Final   Sputum evaluation   Final    THIS SPECIMEN IS  ACCEPTABLE FOR SPUTUM CULTURE Performed at [facility], [place] [address], [place], [place] [address]    Report Status 02/19/2020 FINAL  Final  Culture, respiratory     Status: None   Collection Time: 02/19/20 12:49 AM  Result Value Ref Range Status   Specimen Description EXPECTORATED SPUTUM  Final   Special Requests NONE Reflexed from [id number]  Final   Gram Stain   Final    RARE WBC PRESENT, PREDOMINANTLY PMN RARE SQUAMOUS EPITHELIAL CELLS PRESENT ABUNDANT GRAM NEGATIVE RODS FEW GRAM POSITIVE COCCI FEW GRAM POSITIVE RODS    Culture   Final    ABUNDANT PSEUDOMONAS AERUGINOSA NO STAPHYLOCOCCUS AUREUS ISOLATED Performed at [facility], [place] [address] [place], [place] [address]    Report Status 02/23/2020 FINAL  Final   Organism ID, Bacteria PSEUDOMONAS AERUGINOSA  Final      Susceptibility   Pseudomonas aeruginosa - MIC*    CEFTAZIDIME 2 SENSITIVE Sensitive     CIPROFLOXACIN <=0.25 SENSITIVE Sensitive     GENTAMICIN <=1 SENSITIVE Sensitive     IMIPENEM 2 SENSITIVE Sensitive     PIP/TAZO <=4 SENSITIVE Sensitive     CEFEPIME 0.25 SENSITIVE Sensitive     * ABUNDANT PSEUDOMONAS AERUGINOSA  Culture, blood (routine x 2)     Status: Abnormal   Collection Time: 02/19/20  4:46 AM   Specimen: BLOOD RIGHT HAND  Result Value Ref Range Status   Specimen Description BLOOD RIGHT HAND  Final   Special Requests   Final    BOTTLES DRAWN AEROBIC AND ANAEROBIC Blood Culture adequate volume   Culture  Setup Time   Final    GRAM POSITIVE COCCI IN CLUSTERS AEROBIC BOTTLE ONLY CRITICAL RESULT CALLED TO, READ BACK BY AND VERIFIED WITH:  G. ABBOTT,PHARMD [id number] 02/21/2020 T. TYSOR    Culture (A)  Final    STAPHYLOCOCCUS CAPITIS THE SIGNIFICANCE OF ISOLATING THIS ORGANISM FROM A SINGLE SET OF BLOOD CULTURES WHEN MULTIPLE SETS ARE DRAWN IS UNCERTAIN. PLEASE NOTIFY THE MICROBIOLOGY DEPARTMENT WITHIN ONE WEEK IF SPECIATION AND SENSITIVITIES ARE REQUIRED. Performed at [facility],  [place] [address], [place], [place] [address]    Report Status 02/21/2020 FINAL  Final  Blood Culture ID Panel (Reflexed)     Status: Abnormal   Collection Time: 02/19/20  4:46 AM  Result Value Ref Range Status   Enterococcus faecalis NOT DETECTED NOT DETECTED Final   Enterococcus Faecium NOT DETECTED NOT DETECTED Final   Listeria monocytogenes NOT DETECTED NOT DETECTED Final   Staphylococcus species DETECTED (A) NOT DETECTED Final    Comment: CRITICAL RESULT CALLED TO, READ BACK BY AND VERIFIED WITH: G. ABBOTT,PHARMD [id number] 02/21/2020 T. TYSOR    Staphylococcus aureus (BCID) NOT DETECTED NOT DETECTED Final   Staphylococcus epidermidis NOT DETECTED NOT DETECTED Final   Staphylococcus lugdunensis NOT DETECTED NOT DETECTED Final   Streptococcus species NOT DETECTED NOT DETECTED Final   Streptococcus agalactiae NOT DETECTED NOT DETECTED Final   Streptococcus pneumoniae NOT DETECTED NOT DETECTED Final   Streptococcus pyogenes NOT DETECTED NOT DETECTED Final   A.calcoaceticus-baumannii NOT DETECTED NOT DETECTED Final   Bacteroides fragilis NOT DETECTED NOT DETECTED Final   Enterobacterales NOT DETECTED NOT DETECTED Final   Enterobacter cloacae complex NOT DETECTED NOT DETECTED Final   Escherichia coli NOT DETECTED NOT DETECTED Final   Klebsiella aerogenes NOT DETECTED NOT DETECTED Final   Klebsiella oxytoca NOT DETECTED NOT DETECTED Final   Klebsiella pneumoniae NOT DETECTED NOT DETECTED Final   Proteus species NOT DETECTED NOT DETECTED Final   Salmonella species NOT DETECTED NOT DETECTED Final   Serratia marcescens NOT DETECTED NOT DETECTED Final   Haemophilus influenzae NOT DETECTED NOT DETECTED Final   Neisseria meningitidis NOT DETECTED NOT DETECTED Final   Pseudomonas aeruginosa NOT DETECTED NOT DETECTED Final   Stenotrophomonas maltophilia NOT DETECTED NOT DETECTED Final   Candida albicans NOT DETECTED NOT DETECTED Final   Candida auris NOT DETECTED NOT DETECTED Final   Candida glabrata  NOT DETECTED NOT DETECTED Final   Candida krusei NOT DETECTED NOT DETECTED Final   Candida parapsilosis NOT DETECTED NOT DETECTED Final   Candida tropicalis NOT DETECTED NOT DETECTED Final   Cryptococcus neoformans/gattii NOT DETECTED NOT DETECTED Final    Comment: Performed at [facility], [address] [place], [place] [address]  Culture, blood (routine x 2)     Status: None (Preliminary result)   Collection Time: 02/19/20  4:48 AM   Specimen: BLOOD RIGHT ARM  Result Value Ref Range Status   Specimen Description BLOOD RIGHT ARM  Final   Special Requests   Final    BOTTLES DRAWN AEROBIC AND ANAEROBIC Blood Culture results may not be optimal due to an inadequate volume of blood received in culture bottles   Culture   Final    NO GROWTH 4 DAYS Performed at [facility], [place] [address], [place], [place] [address]    Report Status PENDING  Incomplete      Radiology Studies: No results found.   Scheduled Meds:  apixaban  5 mg Oral BID   atorvastatin  40 mg Oral Daily   azithromycin  250 mg Oral Q M,W,F   budesonide-formoterol  2 puff Inhalation BID   fluticasone  2 spray Each  Nare Daily   folic acid  1 mg Oral Daily   furosemide  20 mg Oral Daily   guaiFENesin  1,200 mg Oral BID   ipratropium-albuterol  3 mL Nebulization BID   multivitamin with minerals  1 tablet Oral Daily   pantoprazole  40 mg Oral BID   phosphorus  250 mg Oral TID   predniSONE  10 mg Oral Q breakfast   roflumilast  500 mcg Oral Daily   thiamine  100 mg Oral Daily   Or   thiamine  100 mg Intravenous Daily   Tiotropium Bromide Monohydrate  2 puff Inhalation Daily   Continuous Infusions:   LOS: 5 days   Time Spent in minutes   45 minutes  Carriann Hesse D.O. on 02/23/2020 at 4:07 PM  Between 7am to 7pm - Please see pager noted on [email or web address]  After 7pm go to [email or web address]  And look for the night coverage person covering for me after hours  [facility]  Group Office  [phone]

## 2020-02-23 NOTE — Evaluation (Signed)
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 for acute anemia.  Clinical Impression  Pt admitted with above diagnosis. Pt was unable to progress ambulation due to DOE 4/4 with just standing. Did transfer to recliner with min guard assist. Wife states pt moves very little at baseline due to DOE.  Sats were >90% at 4L at rest and >90% with 6L with activity.  Pt and wife requesting a wheelchair and this PT agrees with pts low endurance for activity. Will follow acutely.  Pt currently with functional limitations due to the deficits listed below (see PT Problem List). Pt will benefit from skilled PT to increase their independence and safety with mobility to allow discharge to the venue listed below.      Follow Up Recommendations Home health PT;Supervision/Assistance - 24 hour    Equipment Recommendations  Wheelchair (18x16 lightweight wheelchair with elevating legrests, desk armrests and anti tippers);Wheelchair cushion (18x16 pressure relieving cushion)    Recommendations for Other Services       Precautions / Restrictions Precautions Precautions: Fall Restrictions Weight Bearing Restrictions: No      Mobility  Bed Mobility Overal bed mobility: Needs Assistance Bed Mobility: Supine to Sit;Sit to Supine     Supine to sit: Min assist;HOB elevated     General bed mobility comments: Wife assisted pt to EOB.    Transfers Overall transfer level: Needs assistance Equipment used: Rolling walker (2 wheeled) Transfers: Sit to/from [facility] Sit to Stand: Min assist Stand pivot transfers: Min guard       General transfer comment: Pt needed assist to power up and cues for hand placement with bed raised as well.  Stood first time and stated he could not walk as he was SOB just standing.  Had sitting rest break.  Stated he cant walk therfore brought the chair around bed and he pivoted to the chair with min gaurd assist with rW. DOE 4/4 with minimal activity.  Takes incr time to recover.  Pt on 4L on arrival and 6L with activity to keep sats >90%.   Ambulation/Gait                Stairs            Wheelchair Mobility    Modified Rankin (Stroke Patients Only)       Balance Overall balance assessment: Needs assistance Sitting-balance support: No upper extremity supported;Feet supported Sitting balance-Leahy Scale: Fair     Standing balance support: Bilateral upper extremity supported;During functional activity Standing balance-Leahy Scale: Poor Standing balance comment: walker and min guard for static standing                             Pertinent Vitals/Pain Pain Assessment: No/denies pain    Home Living Family/patient expects to be discharged to:: Private residence Living Arrangements: Spouse/significant other Available Help at Discharge: Family;Available 24 hours/day Type of Home: House Home Access: Stairs to enter Entrance Stairs-Rails: None Entrance  Stairs-Number of Steps: 2 Home Layout: One level Home Equipment: [place] - 2 wheels;Shower seat;Bedside commode;Walker - 4 wheels      Prior Function Level of Independence: Needs assistance   Gait / Transfers Assistance Needed: Modified independent short distances with walker     Comments: Walks short distances, uses RW as needed. Wears 3L/min 02 at home. independent ADLs     Hand  Dominance   Dominant Hand: Right    Extremity/Trunk Assessment   Upper Extremity Assessment Upper Extremity Assessment: Defer to OT evaluation    Lower Extremity Assessment Lower Extremity Assessment: Generalized weakness    Cervical / Trunk Assessment Cervical / Trunk Assessment: Kyphotic  Communication   Communication: No difficulties  Cognition Arousal/Alertness: Awake/alert Behavior During Therapy: WFL for tasks assessed/performed Overall Cognitive Status: Within Functional Limits for tasks assessed                                        General Comments General comments (skin integrity, edema, etc.): wife present.  discussed [employer] techniques and gave pt handout.     Exercises General Exercises - Lower Extremity Ankle Circles/Pumps: AAROM;10 reps Long Arc Quad: AROM;Both;10 reps;Seated Hip Flexion/Marching: AROM;Both;10 reps;Seated   Assessment/Plan    PT Assessment Patient needs continued PT services  PT Problem List Decreased strength;Decreased activity tolerance;Decreased balance;Decreased mobility;Cardiopulmonary status limiting activity;Pain       PT Treatment Interventions DME instruction;Gait training;Stair training;Functional mobility training;Therapeutic activities;Therapeutic exercise;Balance training;Patient/family education    PT Goals (Current goals can be found in the Care Plan section)  Acute Rehab PT Goals Patient Stated Goal: to go home PT Goal Formulation: With patient Time For Goal Achievement: 03/08/20 Potential to Achieve Goals: Good    Frequency Min 3X/week   Barriers to discharge        Co-evaluation               AM-PAC PT "6 Clicks" Mobility  Outcome Measure Help needed turning from your back to your side while in a flat bed without using bedrails?: A Little Help needed moving from lying on your back to sitting on the side of a flat bed without using bedrails?: A Little Help needed moving to  and from a bed to a chair (including a wheelchair)?: A Little Help needed standing up from a chair using your arms (e.g., wheelchair or bedside chair)?: A Little Help needed to walk in hospital room?: Total Help needed climbing 3-5 steps with a railing? : Total 6 Click Score: 14    End of Session Equipment Utilized During Treatment: Gait belt;Oxygen Activity Tolerance: Patient limited by fatigue Patient left: in chair;with call bell/phone within reach;with chair alarm set;with family/visitor present Nurse Communication: Mobility status PT Visit Diagnosis: Unsteadiness on feet (R26.81);Other abnormalities of gait and mobility (R26.89);Muscle weakness (generalized) (M62.81);Pain Pain - Right/Left: Left Pain - part of body: Ankle and joints of foot    Time: 0925-0958 PT Time Calculation (min) (ACUTE ONLY): 33 min   Charges:   PT Evaluation $PT Eval Moderate Complexity: 1 Mod PT Treatments $Therapeutic Activity: 8-22 mins        Al Gagen W,PT Acute Rehabilitation Services Pager:  [phone]  Office:  [phone]    Denice Paradise 02/23/2020, 12:08 PM

## 2020-02-23 NOTE — Progress Notes (Signed)
[place] for heparin Indication: small PE  12/15/19  Allergies  Allergen Reactions  . Tape Other (See Comments)    SKIN IS VERY THIN, TEARS SKIN; CAN ONLY USE COBAN WRAPS DUE TO CONDITION OF SKIN!!  . Ciprofloxacin Nausea Only    Sick on stomach, weak/tired  . Levaquin [Levofloxacin] Other (See Comments)    hallucinations    Patient Measurements: Height: 6' (182.9 cm) Weight: 70.1 kg (154 lb 8 oz) IBW/kg (Calculated) : 77.6 Heparin Dosing Weight: 66 kg  Vital Signs: Temp: 98.7 F (37.1 C) (08/18 0327) Temp Source: Oral (08/18 0327) BP: 144/69 (08/18 0327) Pulse Rate: 101 (08/18 0327)  Labs: Recent Labs    02/21/20 0406 02/21/20 0406 02/21/20 1745 02/21/20 1745 02/22/20 0429 02/22/20 0429 02/22/20 1426 02/23/20 0911  HGB 8.0*   < > 8.3*   < > 7.6*   < > 8.4* 7.9*  HCT 24.8*   < > 26.5*   < > 24.6*  --  27.3* 26.8*  PLT 226  --   --   --  218  --   --  190  APTT  --   --  56*  --   --   --   --   --   HEPARINUNFRC  --   --  0.20*  --  0.31  --   --  0.37  CREATININE 0.84  --   --   --  0.82  --   --  0.92   < > = values in this interval not displayed.    Estimated Creatinine Clearance: 72 mL/min (by C-G formula based on SCr of 0.92 mg/dL).   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 Last dose of rivaroxaban 8/13 7am.  Pharmacy consulted for heparin while rivaroxaban on hold.  Heparin level today is 0.37, therapeutic on heparin drip 1200 units/hr.  Dr. Ree Kida has decided to switch from his prior to admit Xarelto to Apixaban due to bleeding on Xarelto.  Pharmacy consulted to transition from IV heparin today to Apixaban for h/o PE.  Hgb 7-8, plt wnl,stable. No bleeding reported.   Xarelto was initially started on 04/08/2018 for PE diagnosed 04/07/18 by CT angio.  Prior to this admit on  12/15/19 CT angio:  Small amt of PE within a lower lobe branch of the right pulmonary artery. PE seen within this region on the prior study. As result, this may be chronic in nature.    Plan:  Discontinue IV heparin drip now and start Apixaban 5 mg BID, give 1st dose at time of stopping heparin drip.  Monitor for signs/symptoms of bleeding.  TOC consult ordered for benefits check for apixaban co-pay.    Nicole Cella, RPh Clinical Pharmacist  Please check AMION for all [place] phone numbers After 10:00 PM, call [place] [phone]

## 2020-02-23 NOTE — TOC Transition Note (Signed)
Transition of Care [facility]) - CM/SW Discharge Note   Patient Details  Name: Francisco [name] MRN: [id number] Date of Birth: 1946-04-14  Transition of Care [facility]) CM/SW Contact:  Zenon Mayo, RN Phone Number: 02/23/2020, 9:55 AM   Clinical Narrative:    NCM gave patient substance abuse resources, NCM asked MD for HHRN,HHPT orders, he is active with [facility] and would like to continue.  HHOT orders added also.  He states he has transportation home at [employer] and he has home oxygen which will be in the car at dc   Final next level of care: [place] Barriers to Discharge: Continued Medical Work up   Patient Goals and CMS Choice Patient states their goals for this hospitalization and ongoing recovery are:: get better CMS Medicare.gov Compare Post Acute Care list provided to:: Patient Choice offered to / list presented to : Patient  Discharge Placement                       Discharge Plan and Services                  DME Agency: NA       HH Arranged: RN, PT, OT [facility] Agency: [place] ([place]) Date [place]: 02/22/20 Time HH Agency Contacted: 1000 Representative spoke with at [place]: [place] ([place]) Interventions     Readmission Risk Interventions Readmission Risk Prevention Plan 01/31/2020  Transportation Screening Complete  PCP or Specialist Appt within 5-7 Days Complete  Home Care Screening Complete  Medication Review (RN CM) Complete  Some recent data might be hidden

## 2020-02-23 NOTE — TOC Benefit Eligibility Note (Signed)
Transition of Care [facility]) Benefit Eligibility Note    Patient Details  Name: ETHERIDGE GEIL MRN: [id number] Date of Birth: Jun 01, 1946   Medication/Dose: Arne [place] 5 MG BID   and   ELIQUIS  2.5 MG BID  Covered?: Yes  Tier: 2 Drug  Prescription Coverage Preferred Pharmacy: [facility]  Spoke with Person/Company/Phone Number:: [facility]   @  [employer]  [employer]  #  [phone]  Co-Pay: $35.00  FOR EACH PRESCRIPTION  Prior Approval: No  Deductible: Met  Additional Notes: Q/L TWO PILL PER DAY    Memory Argue Phone Number: 02/23/2020, 4:53 PM

## 2020-02-24 ENCOUNTER — Encounter: Payer: Self-pay | Admitting: Family Medicine

## 2020-02-24 LAB — BASIC METABOLIC PANEL
Anion gap: 7 (ref 5–15)
BUN: 8 mg/dL (ref 8–23)
CO2: 27 mmol/L (ref 22–32)
Calcium: 7.5 mg/dL — ABNORMAL LOW (ref 8.9–10.3)
Chloride: 106 mmol/L (ref 98–111)
Creatinine, Ser: 1.12 mg/dL (ref 0.61–1.24)
GFR calc Af Amer: >60 mL/min (ref >60)
GFR calc non Af Amer: >60 mL/min (ref >60)
Glucose, Bld: 96 mg/dL (ref 70–99)
Potassium: 3.9 mmol/L (ref 3.5–5.1)
Sodium: 140 mmol/L (ref 135–145)

## 2020-02-24 LAB — CULTURE, BLOOD (ROUTINE X 2): Culture: NO GROWTH

## 2020-02-24 LAB — HEMOGLOBIN AND HEMATOCRIT, BLOOD
HCT: 25.8 % — ABNORMAL LOW (ref 39.0–52.0)
Hemoglobin: 7.7 g/dL — ABNORMAL LOW (ref 13.0–17.0)

## 2020-02-24 LAB — HEPARIN LEVEL (UNFRACTIONATED): Heparin Unfractionated: >2.2 IU/mL — ABNORMAL HIGH (ref 0.30–0.70)

## 2020-02-24 MED ORDER — ATORVASTATIN CALCIUM 40 MG PO TABS
40.0000 mg | ORAL_TABLET | Freq: Every day | ORAL | Status: DC
  Administered 2020-02-25: 40 mg via ORAL
  Filled 2020-02-24: qty 1

## 2020-02-24 MED ORDER — APIXABAN 2.5 MG PO TABS
2.5000 mg | ORAL_TABLET | Freq: Two times a day (BID) | ORAL | Status: DC
  Administered 2020-02-24 – 2020-02-25 (×3): 2.5 mg via ORAL
  Filled 2020-02-24 (×3): qty 1

## 2020-02-24 NOTE — Progress Notes (Addendum)
PROGRESS NOTE    Francisco [name]  ZYS:[id number] DOB: December 22, 1945 DOA: 02/18/2020 PCP: [name], MD   Brief Narrative:  HPI on 02/18/2020 by Dr. Inda Merlin 74 year old male with past medical history of advanced COPD, chronic respiratory failure on 4 L of oxygen via nasal cannula, diastolic congestive heart failure (echo 10/2017 EF 65-70%), severe peripheral vascular disease (S/P endarterectomy right femoral in 2020 and left femoral 01/2020), hypertension, history of PE on chronic anticoagulation with Xarelto who presents to [facility] emergency department after being sent over by his vascular surgeon [name] for acute anemia.  Of note, patient was recently hospitalized at [facility] from 7/16-7/26 for left femoral endarterectomy due to critical limb ischemia of left lower extremity.  During that hospitalization, the patient was noted to be anemic however this was felt to be perioperative anemia.  The patient was transfused 2 units of packed red blood cells and eventually discharged home with a hemoglobin of 9.5 on 7/26.  Patient's home regimen of Xarelto 20 mg daily was resumed as well as his home regimen of aspirin 81 mg daily at time of discharge.  Patient explains that since he was discharged, he has been experiencing frequent bouts of black tarry stool.  This had been happening at least once daily.  Patient denies any associated abdominal pain, nausea, vomiting, changes in appetite.  In the weeks that followed, patient developed progressively worsening generalized weakness, shortness of breath beyond his baseline and lightheadedness.  The patient has become so weak that he requires the assistance of his family to even get out of his bed or chair where at baseline he is typically able to ambulate on his own.  Patient followed with [name] in clinic on 8/11 who obtained a CBC revealed a hemoglobin of 5.0.  Patient was instructed to go to [facility] emergency  department for evaluation at this time.  Upon evaluation in the emergency department CBC did confirm hemoglobin of 5.0, down from 9.5 in late July.  2 units of packed red blood cells were ordered.  Coagulation profile did reveal an INR of 2.4.  80 mg of Protonix were administered.  500 cc of normal saline was administered.  The hospitalist group was then called to assess the patient for admission to the hospital.  Interim history [name] for GI bleeding received 2 units PRBC.  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  Patient with a history of PE and was placed on Xarelto and aspirin prior to admission.  Will restart Eliquis today and monitor patient's hemoglobin.  Assessment & Plan   Acute GI bleed -Patient presented with a hemoglobin of 5 which started approximately 3 weeks prior to admission with melena -He was on Xarelto and aspirin prior to admission -Patient was transfused 2 units PRBC -Hemoglobin today 7.7 -Gastroenterology consulted and appreciated -Status post EGD which showed AV malformation and duodenitis with active oozing of blood -Per gastroenterology, can restart anticoagulation within 72 hours and later with aspirin while monitoring H&H -Patient will need to follow-up with gastroenterology as an outpatient -Continue to monitor CBC for additional day given the start of Eliquis  Lactic acidosis -Suspect secondary to anemia -Has resolved  Chronic respiratory failure with hypoxia/stage IV severe COPD -Patient uses 3 to 4 L of oxygen at home -Continue supplemental oxygen to maintain saturations of 88 to 92% -Continue inhalers and nebulizer treatments  History of PE -As above,  patient was noted to be on Xarelto and aspirin prior to admission -Discussed with [name], patient's pulmonologist-will start patient on Eliquis -given that PE was in 2019, will reduce dose of Elqius (in the setting of recent GI  Bleed)  Peripheral arterial disease -Advance as you significant hyperemia of the distal bilateral lower extremities -Additionally patient with a chronic appearance of multiple toes on left foot -Was seen by vascular 2 days prior and is noted to be stable -As above we will start Eliquis.  Will hold off on restarting aspirin for now  Lung infiltrate on CT -Patient has followed with an infectious disease in the past, [name] for frequent bouts of bronchitis.  Last visit was in mid July 2021 at that point antibiotics were not indicated -Patient does follow with [name], pulmonology. -Does have chronic cough which is productive however has not had any changes  Hypokalemia/hypophosphatemia -Continue to monitor and replace as needed  DVT Prophylaxis  Heparin --> Eliquis  Code Status: DNR  Family Communication: None at bedside. Wife at bedside.   Disposition Plan:  Status is: Inpatient  Remains inpatient appropriate because:Continue to monitor H/H given restarting of anticoagulation.    Dispo: [name]              Anticipated d/c is to: Home with home health              Anticipated d/c date is: 2 days              Patient currently is not medically stable to d/c.   Consultants Gastroenterology Pulmonology Infectious disease  Procedures  EGD  Antibiotics   Anti-infectives (From admission, onward)   Start     Dose/Rate Route Frequency Ordered Stop   02/21/20 1000  azithromycin (ZITHROMAX) tablet 250 mg        250 mg Oral Every M-W-F 02/18/20 2025        Subjective:   Francisco [name] seen and examined today.  Patient continues to feel weak and somewhat short of breath.  Denies any further dark stools.  Denies chest pain, abdominal pain, nausea or vomiting, diarrhea constipation, dizziness or headache.    Objective:   Vitals:   02/24/20 0400 02/24/20 0834 02/24/20 0852 02/24/20 0856  BP: 137/74 (!) 108/56    Pulse: 98 (!) 120    Resp: 19 20 16     Temp: 97.7 F (36.5 C) 98.2 F (36.8 C)    TempSrc: Oral Oral    SpO2: 98% 100%  100%  Weight: 70.3 kg     Height:        Intake/Output Summary (Last 24 hours) at 02/24/2020 1125 Last data filed at 02/24/2020 0645 Gross per 24 hour  Intake 480 ml  Output 275 ml  Net 205 ml   Filed Weights   02/22/20 0010 02/23/20 0327 02/24/20 0400  Weight: 72.5 kg 70.1 kg 70.3 kg   Exam  General: Well developed, chronically ill-appearing, NAD  HEENT: NCAT, mucous membranes moist.   Cardiovascular: S1 S2 auscultated, tachycardic   Respiratory: Diminished breath sounds, increased work of breathing   Abdomen: Soft, nontender, nondistended, + bowel sounds  Extremities: warm dry without cyanosis clubbing or edema  Neuro: AAOx3, nonfocal  Psych: Appropriate mood and affect   Data Reviewed: I have personally reviewed following labs and imaging studies  CBC: Recent Labs  Lab 02/18/20 1645 02/19/20 0447 02/19/20 1142 02/19/20 1903 02/20/20 4193 02/20/20 7902 02/21/20 0406 02/21/20 0406 02/21/20 1745  02/22/20 0429 02/22/20 1426 02/23/20 0911 02/24/20 0349  WBC 11.6*   < > 10.4  --  8.2  --  7.1  --   --  7.9  --  8.6  --   NEUTROABS 9.7*  --   --   --   --   --   --   --   --   --   --   --   --   HGB 5.0*   < > 8.2*   < > 7.8*   < > 8.0*   < > 8.3* 7.6* 8.4* 7.9* 7.7*  HCT 18.1*   < > 25.7*   < > 24.9*   < > 24.8*   < > 26.5* 24.6* 27.3* 26.8* 25.8*  MCV 92.8   < > 86.0  --  86.5  --  87.9  --   --  88.5  --  91.8  --   PLT 339   < > 266  --  243  --  226  --   --  218  --  190  --    < > = values in this interval not displayed.   Basic Metabolic Panel: Recent Labs  Lab 02/19/20 0452 02/19/20 0452 02/20/20 0627 02/21/20 0406 02/22/20 0429 02/23/20 0911 02/24/20 0349  NA 139   < > 139 137 139 141 140  K 3.6   < > 3.3* 3.3* 3.3* 3.1* 3.9  CL 101   < > 100 100 104 105 106  CO2 27   < > 30 29 27 29 27   GLUCOSE 82   < > 82 87 94 114* 96  BUN 20   < > 13 8 8  6* 8   CREATININE 1.03   < > 0.87 0.84 0.82 0.92 1.12  CALCIUM 8.2*   < > 8.1* 7.7* 7.8* 7.7* 7.5*  MG 1.8  --  1.9 1.9 1.8 1.9  --   PHOS  --   --  3.0 2.7 1.7* 2.1*  --    < > = values in this interval not displayed.   GFR: Estimated Creatinine Clearance: 59.3 mL/min (by C-G formula based on SCr of 1.12 mg/dL). Liver Function Tests: Recent Labs  Lab 02/18/20 1645 02/19/20 0452  AST 23 15  ALT 20 15  ALKPHOS 68 56  BILITOT 0.6 0.8  PROT 5.7* 4.8*  ALBUMIN 2.8* 2.4*   No results for input(s): LIPASE, AMYLASE in the last 168 hours. No results for input(s): AMMONIA in the last 168 hours. Coagulation Profile: Recent Labs  Lab 02/18/20 1815 02/19/20 0452  INR 2.4* 1.7*   Cardiac Enzymes: No results for input(s): CKTOTAL, CKMB, CKMBINDEX, TROPONINI in the last 168 hours. BNP (last 3 results) No results for input(s): PROBNP in the last 8760 hours. HbA1C: No results for input(s): HGBA1C in the last 72 hours. CBG: No results for input(s): GLUCAP in the last 168 hours. Lipid Profile: No results for input(s): CHOL, HDL, LDLCALC, TRIG, CHOLHDL, LDLDIRECT in the last 72 hours. Thyroid Function Tests: No results for input(s): TSH, T4TOTAL, FREET4, T3FREE, THYROIDAB in the last 72 hours. Anemia Panel: No results for input(s): VITAMINB12, FOLATE, FERRITIN, TIBC, IRON, RETICCTPCT in the last 72 hours. Urine analysis:    Component Value Date/Time   COLORURINE YELLOW 04/15/2019 1530   APPEARANCEUR CLEAR 04/15/2019 1530   LABSPEC 1.012 04/15/2019 1530   PHURINE 7.0 04/15/2019 [place] 04/15/2019 [place] 06/25/2017 0831  HGBUR SMALL (A) 04/15/2019 1530   HGBUR trace-lysed 02/12/2010 0819   BILIRUBINUR NEGATIVE 04/15/2019 1530   BILIRUBINUR Negative 12/25/2017 0852   KETONESUR NEGATIVE 04/15/2019 1530   PROTEINUR NEGATIVE 04/15/2019 1530   UROBILINOGEN 0.2 12/25/2017 0852   UROBILINOGEN 0.2 06/25/2017 0831   NITRITE NEGATIVE 04/15/2019 1530    LEUKOCYTESUR NEGATIVE 04/15/2019 1530   Sepsis Labs: @LABRCNTIP (procalcitonin:4,lacticidven:4)  ) Recent Results (from the past 240 hour(s))  SARS Coronavirus 2 by RT PCR (hospital order, performed in [facility] hospital lab) Nasopharyngeal Nasopharyngeal Swab     Status: None   Collection Time: 02/18/20  6:10 PM   Specimen: Nasopharyngeal Swab  Result Value Ref Range Status   SARS Coronavirus 2 NEGATIVE NEGATIVE Final    Comment: (NOTE) SARS-CoV-2 target nucleic acids are NOT DETECTED.  The SARS-CoV-2 RNA is generally detectable in upper and lower respiratory specimens during the acute phase of infection. The lowest concentration of SARS-CoV-2 viral copies this assay can detect is 250 copies / mL. A negative result does not preclude SARS-CoV-2 infection and should not be used as the sole basis for treatment or other patient management decisions.  A negative result may occur with improper specimen collection / handling, submission of specimen other than nasopharyngeal swab, presence of viral mutation(s) within the areas targeted by this assay, and inadequate number of viral copies (<250 copies / mL). A negative result must be combined with clinical observations, patient history, and epidemiological information.  Fact Sheet for Patients:   StrictlyIdeas.no  Fact Sheet for Healthcare Providers: BankingDealers.co.za  This test is not yet approved or  cleared by the [place] FDA and has been authorized for detection and/or diagnosis of SARS-CoV-2 by FDA under an Emergency Use Authorization (EUA).  This EUA will remain in effect (meaning this test can be used) for the duration of the COVID-19 declaration under Section 564(b)(1) of the Act, 21 U.S.C. section 360bbb-3(b)(1), unless the authorization is terminated or revoked sooner.  Performed at [facility], [place] [address] [place], [place] [address]   Expectorated sputum  assessment w rflx to resp cult     Status: None   Collection Time: 02/19/20 12:49 AM   Specimen: Expectorated Sputum  Result Value Ref Range Status   Specimen Description EXPECTORATED SPUTUM  Final   Special Requests NONE  Final   Sputum evaluation   Final    THIS SPECIMEN IS ACCEPTABLE FOR SPUTUM CULTURE Performed at [facility], [place] [address] [place], [place] [address]    Report Status 02/19/2020 FINAL  Final  Culture, respiratory     Status: None   Collection Time: 02/19/20 12:49 AM  Result Value Ref Range Status   Specimen Description EXPECTORATED SPUTUM  Final   Special Requests NONE Reflexed from [id number]  Final   Gram Stain   Final    RARE WBC PRESENT, PREDOMINANTLY PMN RARE SQUAMOUS EPITHELIAL CELLS PRESENT ABUNDANT GRAM NEGATIVE RODS FEW GRAM POSITIVE COCCI FEW GRAM POSITIVE RODS    Culture   Final    ABUNDANT PSEUDOMONAS AERUGINOSA NO STAPHYLOCOCCUS AUREUS ISOLATED Performed at [facility], [place] [address], [place], [place] [address]    Report Status 02/23/2020 FINAL  Final   Organism ID, Bacteria PSEUDOMONAS AERUGINOSA  Final      Susceptibility   Pseudomonas aeruginosa - MIC*    CEFTAZIDIME 2 SENSITIVE Sensitive     CIPROFLOXACIN <=0.25 SENSITIVE Sensitive     GENTAMICIN <=1 SENSITIVE Sensitive     IMIPENEM 2  SENSITIVE Sensitive     PIP/TAZO <=4 SENSITIVE Sensitive     CEFEPIME 0.25 SENSITIVE Sensitive     * ABUNDANT PSEUDOMONAS AERUGINOSA  Culture, blood (routine x 2)     Status: Abnormal   Collection Time: 02/19/20  4:46 AM   Specimen: BLOOD RIGHT HAND  Result Value Ref Range Status   Specimen Description BLOOD RIGHT HAND  Final   Special Requests   Final    BOTTLES DRAWN AEROBIC AND ANAEROBIC Blood Culture adequate volume   Culture  Setup Time   Final    GRAM POSITIVE COCCI IN CLUSTERS AEROBIC BOTTLE ONLY CRITICAL RESULT CALLED TO, READ BACK BY AND VERIFIED WITH: G. ABBOTT,PHARMD [id number] 02/21/2020 T. TYSOR    Culture (A)  Final     STAPHYLOCOCCUS CAPITIS THE SIGNIFICANCE OF ISOLATING THIS ORGANISM FROM A SINGLE SET OF BLOOD CULTURES WHEN MULTIPLE SETS ARE DRAWN IS UNCERTAIN. PLEASE NOTIFY THE MICROBIOLOGY DEPARTMENT WITHIN ONE WEEK IF SPECIATION AND SENSITIVITIES ARE REQUIRED. Performed at [facility], [place] [address] [place], [place] [address]    Report Status 02/21/2020 FINAL  Final  Blood Culture ID Panel (Reflexed)     Status: Abnormal   Collection Time: 02/19/20  4:46 AM  Result Value Ref Range Status   Enterococcus faecalis NOT DETECTED NOT DETECTED Final   Enterococcus Faecium NOT DETECTED NOT DETECTED Final   Listeria monocytogenes NOT DETECTED NOT DETECTED Final   Staphylococcus species DETECTED (A) NOT DETECTED Final    Comment: CRITICAL RESULT CALLED TO, READ BACK BY AND VERIFIED WITH: G. ABBOTT,PHARMD [id number] 02/21/2020 T. TYSOR    Staphylococcus aureus (BCID) NOT DETECTED NOT DETECTED Final   Staphylococcus epidermidis NOT DETECTED NOT DETECTED Final   Staphylococcus lugdunensis NOT DETECTED NOT DETECTED Final   Streptococcus species NOT DETECTED NOT DETECTED Final   Streptococcus agalactiae NOT DETECTED NOT DETECTED Final   Streptococcus pneumoniae NOT DETECTED NOT DETECTED Final   Streptococcus pyogenes NOT DETECTED NOT DETECTED Final   A.calcoaceticus-baumannii NOT DETECTED NOT DETECTED Final   Bacteroides fragilis NOT DETECTED NOT DETECTED Final   Enterobacterales NOT DETECTED NOT DETECTED Final   Enterobacter cloacae complex NOT DETECTED NOT DETECTED Final   Escherichia coli NOT DETECTED NOT DETECTED Final   Klebsiella aerogenes NOT DETECTED NOT DETECTED Final   Klebsiella oxytoca NOT DETECTED NOT DETECTED Final   Klebsiella pneumoniae NOT DETECTED NOT DETECTED Final   Proteus species NOT DETECTED NOT DETECTED Final   Salmonella species NOT DETECTED NOT DETECTED Final   Serratia marcescens NOT DETECTED NOT DETECTED Final   Haemophilus influenzae NOT DETECTED NOT DETECTED Final   Neisseria  meningitidis NOT DETECTED NOT DETECTED Final   Pseudomonas aeruginosa NOT DETECTED NOT DETECTED Final   Stenotrophomonas maltophilia NOT DETECTED NOT DETECTED Final   Candida albicans NOT DETECTED NOT DETECTED Final   Candida auris NOT DETECTED NOT DETECTED Final   Candida glabrata NOT DETECTED NOT DETECTED Final   Candida krusei NOT DETECTED NOT DETECTED Final   Candida parapsilosis NOT DETECTED NOT DETECTED Final   Candida tropicalis NOT DETECTED NOT DETECTED Final   Cryptococcus neoformans/gattii NOT DETECTED NOT DETECTED Final    Comment: Performed at [facility], [address], [place], [place] [address]  Culture, blood (routine x 2)     Status: None   Collection Time: 02/19/20  4:48 AM   Specimen: BLOOD RIGHT ARM  Result Value Ref Range Status   Specimen Description BLOOD RIGHT ARM  Final   Special Requests  Final    BOTTLES DRAWN AEROBIC AND ANAEROBIC Blood Culture results may not be optimal due to an inadequate volume of blood received in culture bottles   Culture   Final    NO GROWTH 5 DAYS Performed at [facility], [place] [address], [place], [place] [address]    Report Status 02/24/2020 FINAL  Final      Radiology Studies: No results found.   Scheduled Meds: . apixaban  2.5 mg Oral BID  . atorvastatin  40 mg Oral Daily  . azithromycin  250 mg Oral Q M,W,F  . budesonide-formoterol  2 puff Inhalation BID  . fluticasone  2 spray Each Nare Daily  . folic acid  1 mg Oral Daily  . furosemide  20 mg Oral Daily  . guaiFENesin  1,200 mg Oral BID  . ipratropium-albuterol  3 mL Nebulization BID  . multivitamin with minerals  1 tablet Oral Daily  . pantoprazole  40 mg Oral BID  . predniSONE  10 mg Oral Q breakfast  . roflumilast  500 mcg Oral Daily  . thiamine  100 mg Oral Daily   Or  . thiamine  100 mg Intravenous Daily  . Tiotropium Bromide Monohydrate  2 puff Inhalation Daily   Continuous Infusions:   LOS: 6 days   Time Spent in minutes   45  minutes  Meranda Dechaine D.O. on 02/24/2020 at 11:25 AM  Between 7am to 7pm - Please see pager noted on [email or web address]  After 7pm go to [email or web address]  And look for the night coverage person covering for me after hours  [facility] Office  [phone]

## 2020-02-24 NOTE — Care Management Important Message (Signed)
Important Message  Patient Details  Name: Francisco [name] MRN: [id number] Date of Birth: August 28, 1945   Medicare Important Message Given:  Yes     Shelda Altes 02/24/2020, 8:36 AM

## 2020-02-24 NOTE — Progress Notes (Signed)
[place] for Apixaban Indication: small PE  12/15/19, hx PE 04/07/18  Allergies  Allergen Reactions  . Tape Other (See Comments)    SKIN IS VERY THIN, TEARS SKIN; CAN ONLY USE COBAN WRAPS DUE TO CONDITION OF SKIN!!  . Ciprofloxacin Nausea Only    Sick on stomach, weak/tired  . Levaquin [Levofloxacin] Other (See Comments)    hallucinations    Patient Measurements: Height: 6' (182.9 cm) Weight: 70.3 kg (154 lb 14.4 oz) IBW/kg (Calculated) : 77.6 Heparin Dosing Weight: 66 kg  Vital Signs: Temp: 98.2 F (36.8 C) (08/19 0834) Temp Source: Oral (08/19 0834) BP: 108/56 (08/19 0834) Pulse Rate: 120 (08/19 0834)  Labs: Recent Labs    02/21/20 1745 02/21/20 1745 02/22/20 0429 02/22/20 0429 02/22/20 1426 02/22/20 1426 02/23/20 0911 02/24/20 0349  HGB 8.3*   < > 7.6*   < > 8.4*   < > 7.9* 7.7*  HCT 26.5*   < > 24.6*   < > 27.3*  --  26.8* 25.8*  PLT  --   --  218  --   --   --  190  --   APTT 56*  --   --   --   --   --   --   --   HEPARINUNFRC 0.20*   < > 0.31  --   --   --  0.37 >2.20*  CREATININE  --   --  0.82  --   --   --  0.92 1.12   < > = values in this interval not displayed.    Estimated Creatinine Clearance: 59.3 mL/min (by C-G formula based on SCr of 1.12 mg/dL).   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 Patient was started on heparin and switched to low dose apixaban given GIB risk and likely chronic PE (not acute).     Copay $35/month per CM note 8/18.   Plan:  Decrease apixban to 2.5mg  per discussion with team  Monitor for signs/symptoms of bleeding   Benetta Spar, PharmD, BCPS, [facility] Clinical Pharmacist  Please check AMION for all [place] phone numbers After 10:00 PM, call [place] [phone]

## 2020-02-24 NOTE — Discharge Instructions (Signed)
Information on my medicine - ELIQUIS (apixaban)  This medication education was reviewed with me or my healthcare representative as part of my discharge preparation.    Why was Eliquis prescribed for you? 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  What do You need to know about Eliquis ? The dose is reduced to ONE 2.5 mg tablet taken TWICE daily. Eliquis may be taken with or without food.   Try to take the dose about the same time in the morning and in the evening. If you have difficulty swallowing the tablet whole please discuss with your pharmacist how to take the medication safely.  Take Eliquis exactly as prescribed and DO NOT stop taking Eliquis without talking to the doctor who prescribed the medication.  Stopping may increase your risk of developing a new blood clot.  Refill your prescription before you run out.  After discharge, you should have regular check-up appointments with your healthcare provider that is prescribing your Eliquis.    What do you do if you miss a dose? If a dose of ELIQUIS is not taken at the scheduled time, take it as soon as possible on the same day and twice-daily administration should be resumed. The dose should not be doubled to make up for a missed dose.  Important Safety Information A possible side effect of Eliquis is bleeding. You should call your healthcare provider right away if you experience any of the following: ? Bleeding from an injury or your nose that does not stop. ? Unusual colored urine (red or dark brown) or unusual colored stools (red or black). ? Unusual bruising for unknown reasons. ? A serious fall or if you hit your head (even if there is no bleeding).  Some medicines may interact with Eliquis and might increase your risk of bleeding or clotting while on Eliquis. To help  avoid this, consult your healthcare provider or pharmacist prior to using any new prescription or non-prescription medications, including herbals, vitamins, non-steroidal anti-inflammatory drugs (NSAIDs) and supplements.  This website has more information on Eliquis (apixaban): http://www.eliquis.com/eliquis/home

## 2020-02-25 LAB — HEMOGLOBIN AND HEMATOCRIT, BLOOD
HCT: 26 % — ABNORMAL LOW (ref 39.0–52.0)
HCT: 34.7 % — ABNORMAL LOW (ref 39.0–52.0)
Hemoglobin: 7.7 g/dL — ABNORMAL LOW (ref 13.0–17.0)
Hemoglobin: 10.5 g/dL — ABNORMAL LOW (ref 13.0–17.0)

## 2020-02-25 LAB — BASIC METABOLIC PANEL
Anion gap: 6 (ref 5–15)
BUN: 7 mg/dL — ABNORMAL LOW (ref 8–23)
CO2: 27 mmol/L (ref 22–32)
Calcium: 7.7 mg/dL — ABNORMAL LOW (ref 8.9–10.3)
Chloride: 104 mmol/L (ref 98–111)
Creatinine, Ser: 0.83 mg/dL (ref 0.61–1.24)
GFR calc Af Amer: >60 mL/min (ref >60)
GFR calc non Af Amer: >60 mL/min (ref >60)
Glucose, Bld: 90 mg/dL (ref 70–99)
Potassium: 3.6 mmol/L (ref 3.5–5.1)
Sodium: 137 mmol/L (ref 135–145)

## 2020-02-25 LAB — PHOSPHORUS: Phosphorus: 2.2 mg/dL — ABNORMAL LOW (ref 2.5–4.6)

## 2020-02-25 LAB — MAGNESIUM: Magnesium: 1.8 mg/dL (ref 1.7–2.4)

## 2020-02-25 LAB — PREPARE RBC (CROSSMATCH)

## 2020-02-25 MED ORDER — SODIUM CHLORIDE 0.9% IV SOLUTION: Freq: Once | INTRAVENOUS | Status: DC

## 2020-02-25 MED ORDER — APIXABAN 2.5 MG PO TABS
2.5000 mg | ORAL_TABLET | Freq: Two times a day (BID) | ORAL | 0 refills | Status: DC
Start: 2020-02-25 — End: 2020-03-10

## 2020-02-25 MED ORDER — FOLIC ACID 1 MG PO TABS: 1.0000 mg | ORAL_TABLET | Freq: Every day | ORAL | Status: AC

## 2020-02-25 MED ORDER — PANTOPRAZOLE SODIUM 40 MG PO TBEC
40.0000 mg | DELAYED_RELEASE_TABLET | Freq: Two times a day (BID) | ORAL | 1 refills | Status: DC
Start: 2020-02-25 — End: 2020-03-10

## 2020-02-25 MED ORDER — K PHOS MONO-SOD PHOS DI & MONO 155-852-130 MG PO TABS
500.0000 mg | ORAL_TABLET | Freq: Once | ORAL | Status: AC
  Administered 2020-02-25: 500 mg via ORAL
  Filled 2020-02-25: qty 2

## 2020-02-25 MED ORDER — ADULT MULTIVITAMIN W/MINERALS CH: 1.0000 | ORAL_TABLET | Freq: Every day | ORAL | Status: AC

## 2020-02-25 MED ORDER — THIAMINE HCL 100 MG PO TABS: 100.0000 mg | ORAL_TABLET | Freq: Every day | ORAL | Status: DC

## 2020-02-25 MED ORDER — ATORVASTATIN CALCIUM 40 MG PO TABS
40.0000 mg | ORAL_TABLET | Freq: Every day | ORAL | 0 refills | Status: DC
Start: 2020-02-26 — End: 2020-04-06

## 2020-02-25 MED FILL — ELIQUIS 2.5 MG TABLET: 2.5 | 30 days supply | Qty: 60 | Fill #0

## 2020-02-25 NOTE — Progress Notes (Signed)
PT note    Durable Medical Equipment  (From admission, onward)         Start     Ordered   02/25/20 1502  For home use only DME lightweight manual wheelchair with seat cushion  Once       Comments: Patient suffers from deconditioning and debility which impairs their ability to perform daily activities like all daily activities in the home.  A wheelchair will allow patient to safely perform daily activities. Patient is not able to propel themselves in the home using a standard weight wheelchair due to weakness. Patient can self propel in the lightweight wheelchair. Length of need is unknown, suspect lifelong.  Accessories: elevating leg rests (ELRs), wheel locks, extensions and anti-tippers.   02/25/20 [place] Pager:  [phone]  Office:  [phone]

## 2020-02-25 NOTE — TOC Transition Note (Addendum)
Transition of Care [facility]) - CM/SW Discharge Note   Patient Details  Name: Francisco [name] MRN: [id number] Date of Birth: June 27, 1946  Transition of Care [facility] CM/SW Contact:  Zenon Mayo, RN Phone Number: 02/25/2020, 9:23 AM   Clinical Narrative:    Patient is for possible dc today he is set up with [facility] fro [place], [place], HHOT, Santiago Glad  with [facility] notified of dc.  Patient would like the eliquis to be filled by the [place] and his other meds to be sent to his local pharmacy. Wife is here and will transport him home today.  Patient will also need a wheelchair, [employer] did not have any, [employer] or [employer] did not have any.  NCM reached out to [employer] in [place], spoke with Cecille Rubin, faxed orders and information to her . She states she will call me back to let me know if it will run thru his insurance, if so they will deliver it to his home tomorrow.  NCM received called from [facility] with [employer], everything looks good, the wheelchair will be delivered tomorrow to patient's address.   Final next level of care: [place] Barriers to Discharge: No Barriers Identified   Patient Goals and CMS Choice Patient states their goals for this hospitalization and ongoing recovery are:: get better CMS Medicare.gov Compare Post Acute Care list provided to:: Patient Choice offered to / list presented to : Patient  Discharge Placement                       Discharge Plan and Services                  DME Agency: NA       HH Arranged: RN, PT, OT [facility] Agency: [place] ([place]) Date [place]: 02/22/20 Time HH Agency Contacted: 1000 Representative spoke with at [place]: [place] ([place]) Interventions     Readmission Risk Interventions Readmission Risk Prevention Plan 01/31/2020  Transportation Screening Complete  PCP or Specialist Appt within 5-7 Days Complete  Home Care Screening Complete   Medication Review (RN CM) Complete  Some recent data might be hidden

## 2020-02-25 NOTE — Discharge Summary (Signed)
Physician Discharge Summary  Francisco [name]:[id number] DOB: 08-02-1945 DOA: 02/18/2020  PCP: [name], MD  Admit date: 02/18/2020 Discharge date: 02/25/2020  Time spent: 45 minutes  Recommendations for Outpatient Follow-up:  Patient will be discharged to home with home health services.  Patient will need to follow up with primary care provider within one week of discharge, repeat CBC, BMP, phosphorus.  Follow up with pulmonology, vascular surgery, gastroenterology.  Patient should continue medications as prescribed.  Patient should follow a heart healthy diet.   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 Inda Merlin 74 year old male with past medical history of advanced COPD, chronic respiratory failure on 4 L of oxygen via nasal cannula, diastolic congestive heart failure(echo 10/2017 EF 65-70%),severe peripheral vascular disease(S/P endarterectomy right femoral in 2020 and left femoral 01/2020),hypertension, history of PE on chronic anticoagulation with Xarelto who presents to [facility] emergency department after being sent over by his vascular surgeon Dr.Fields for acute anemia.  Of note, patient was recently hospitalized at [facility] from 7/16-7/26 for left femoral endarterectomy due to critical limb ischemia of left lower extremity. During that hospitalization, the patient was noted to be anemic however this was felt to be perioperative anemia. The patient was transfused 2 units of packed red blood cells and eventually discharged home with a hemoglobin of  9.5 on 7/26.Patient's home regimen of Xarelto 20 mg daily was resumed as well as his home regimen of aspirin 81 mg daily at time of discharge.  Patient explains that since he was discharged, he has been experiencing frequent bouts of black tarry stool. This had been happening at least once daily.Patient denies any associated abdominal pain, nausea, vomiting, changes in appetite.  In the weeks that followed, patient developed progressively worsening generalized weakness, shortness of breath beyond his baseline and lightheadedness. The patient has become so weak that he requires the assistance of his family to even get out of his bed or chair where at baseline he is typically able to ambulate on his own.  Patient followed with [name] in clinic on 8/11 who obtained a CBC revealed a hemoglobin of 5.0. Patient was instructed to go to [facility] emergency department for evaluation at this time.  Upon evaluation in the emergency department CBC did confirm hemoglobin of 5.0, down from 9.5 in late July. 2 units of packed red blood cells were ordered. Coagulation profile did reveal an INR of 2.4. 80 mg of Protonix were administered. 500 cc of normal saline was administered. The hospitalist group was then called to assess the patient for admission to the hospital.  Hospital Course:  Acute GI bleed/Symptomatic anemia -Patient presented with a hemoglobin of 5 which started approximately 3 weeks prior to admission with melena -He was on Xarelto and aspirin prior to admission -Patient was transfused 2 units PRBC -Hemoglobin today 7.7  -Gastroenterology consulted and appreciated -Status post EGD which showed single angioectasia in the duodenum sweep was noted. Treated with argon plasma coagulation (APC). Clips (MR conditional) was placed. - Duodenitis with 3 areas of active oozing. Treated with argon plasma coagulation (APC). Clips (MR conditional) were placed  to those regions. -GI  recommended PPI BID x 2 months, then daily thereafter -Per gastroenterology, can restart anticoagulation within 72 hours and later with aspirin while monitoring H&H -Patient will need to follow-up with gastroenterology as an outpatient -Continue to monitor CBC for additional day given the start of Eliquis -given deconditioning, transfused an additional unit prior to discharge -repeat CBC in one week  Lactic acidosis -Suspect secondary to anemia -Has resolved  Chronic respiratory failure with hypoxia/stage IV severe COPD -Patient uses 3 to 4 L of oxygen at home -Continue supplemental oxygen to maintain saturations of 88 to 92% -Continue inhalers and nebulizer treatments  History of PE -As above, patient was noted to be on Xarelto and aspirin prior to admission -On 8/18, Discussed with [name], patient's pulmonologist-will start patient on Eliquis -given that PE was in 2019, will reduce dose of Elqius (in the setting of recent GI Bleed)  Peripheral arterial disease -Advance as you significant hyperemia of the distal bilateral lower extremities -Additionally patient with a chronic appearance of multiple toes on left foot -Was seen by vascular 2 days prior and is noted to be stable -As above we will start Eliquis.  Will hold off on restarting aspirin for now -follow up with GI or vascular surgery prior to starting aspirin   Lung infiltrate on CT -Patient has followed with an infectious disease in the past, [name] for frequent bouts of bronchitis.  Last visit was in mid July 2021 at that point antibiotics were not indicated -Patient does follow with [name], pulmonology. -Does have chronic cough which is productive however has not had any changes  Hypokalemia/hypophosphatemia -Dose of Kphos given today -Repeat BMP and phos level in one week  Code status: DNR  Consultants Gastroenterology Pulmonology Infectious disease  Procedures  EGD  Discharge  Exam: Vitals:   02/25/20 0821 02/25/20 1109  BP:  110/69  Pulse:  (!) 104  Resp:  16  Temp:  97.9 F (36.6 C)  SpO2: 99% 100%    Exam  General: Well developed, chronically ill-appearing, NAD  HEENT: NCAT, mucous membranes moist.   Cardiovascular: S1 S2 auscultated, tachycardic   Respiratory: Diminished breath sounds, no wheezing  Abdomen: Soft, nontender, nondistended, + bowel sounds  Extremities: warm dry without cyanosis clubbing or edema  Neuro: AAOx3, nonfocal  Psych: Appropriate mood and affect   Discharge Instructions Discharge Instructions    Discharge instructions   Complete by: As directed    Patient will be discharged to home with home health services.  Patient will need to follow up with primary care provider within one week of discharge, repeat CBC, BMP, phosphorus.  Follow up with pulmonology, vascular surgery, gastroenterology.  Patient should continue medications as prescribed.  Patient should follow a heart healthy diet.   Discharge wound care:   Complete by: As directed    Keep wound clean and dry     Allergies as of 02/25/2020      Reactions   Tape Other (See Comments)   SKIN IS VERY THIN, TEARS SKIN; CAN ONLY USE COBAN WRAPS DUE TO CONDITION OF SKIN!!   Ciprofloxacin Nausea Only   Sick on stomach, weak/tired   Levaquin [levofloxacin] Other (See Comments)   hallucinations      Medication List    STOP taking these medications   aspirin 81 MG EC tablet   rivaroxaban 20 MG Tabs tablet Commonly known as: Xarelto     TAKE these medications   acetaminophen 500 MG tablet Commonly  known as: TYLENOL Take 1,000 mg by mouth every 6 (six) hours as needed for moderate pain. Notes to patient: For mild pain or fever. Do not take more than 3000mg  per day.   albuterol 108 (90 Base) MCG/ACT inhaler Commonly known as: Ventolin HFA USE 2 PUFFS EVERY 6 HOURS  AS NEEDED FOR WHEEZING What changed:   how much to take  how to take this  when to take  this  reasons to take this  additional instructions Notes to patient: For shortness of breath   apixaban 2.5 MG Tabs tablet Commonly known as: ELIQUIS Take 1 tablet (2.5 mg total) by mouth 2 (two) times daily.   atorvastatin 40 MG tablet Commonly known as: LIPITOR Take 1 tablet (40 mg total) by mouth daily. Start taking on: February 26, 2020   azithromycin 250 MG tablet Commonly known as: ZITHROMAX Take 1 tablet (250 mg total) by mouth every Monday, Wednesday, and Friday.   budesonide-formoterol 160-4.5 MCG/ACT inhaler Commonly known as: SYMBICORT Inhale 2 puffs into the lungs 2 (two) times daily.   CALCIUM 600+D PO Take 2 tablets by mouth daily.   cycloSPORINE 0.05 % ophthalmic emulsion Commonly known as: RESTASIS Place 1 drop into both eyes 2 (two) times daily as needed (dry eyes).   Daliresp 500 MCG Tabs tablet Generic drug: roflumilast Take 1 tablet (500 mcg total) by mouth daily.   fluticasone 50 MCG/ACT nasal spray Commonly known as: FLONASE Place 2 sprays into both nostrils daily.   Flutter Devi 10 times Twice a day and prn as needed, may increase if feeling worse   folic acid 1 MG tablet Commonly known as: FOLVITE Take 1 tablet (1 mg total) by mouth daily. Start taking on: February 26, 2020   furosemide 20 MG tablet Commonly known as: LASIX TAKE 1 TABLET BY MOUTH  DAILY What changed:   how much to take  how to take this  when to take this  additional instructions   multivitamin with minerals Tabs tablet Take 1 tablet by mouth daily. Start taking on: February 26, 2020   OXYGEN Inhale 4-5 L into the lungs continuous.   pantoprazole 40 MG tablet Commonly known as: PROTONIX Take 1 tablet (40 mg total) by mouth 2 (two) times daily.   predniSONE 10 MG tablet Commonly known as: DELTASONE Take 1 tablet (10 mg total) by mouth daily with breakfast.   sodium chloride HYPERTONIC 3 % nebulizer solution Take by nebulization daily. What changed:   how  much to take  when to take this  reasons to take this   Spiriva Respimat 2.5 MCG/ACT Aers Generic drug: Tiotropium Bromide Monohydrate USE 2 INHALATIONS BY MOUTH  ONCE DAILY What changed: See the new instructions.   thiamine 100 MG tablet Take 1 tablet (100 mg total) by mouth daily. Start taking on: February 26, 2020   traMADol 50 MG tablet Commonly known as: ULTRAM Take 1 tablet (50 mg total) by mouth every 12 (twelve) hours as needed for moderate pain or severe pain. What changed:   when to take this  additional instructions            Discharge Care Instructions  (From admission, onward)         Start     Ordered   02/25/20 0000  Discharge wound care:       Comments: Keep wound clean and dry   02/25/20 1148         Allergies  Allergen Reactions  . Tape  Other (See Comments)    SKIN IS VERY THIN, TEARS SKIN; CAN ONLY USE COBAN WRAPS DUE TO CONDITION OF SKIN!!  . Ciprofloxacin Nausea Only    Sick on stomach, weak/tired  . Levaquin [Levofloxacin] Other (See Comments)    hallucinations    Follow-up Information    [facility] Follow up.   Why: HHRN,HHPT, HHOT       [name], MD. Go on 03/10/2020.   Specialty: Family Medicine Why: @10 :Max Sane information: [place] [address] [phone]        Mansouraty, Telford Nab., MD. Schedule an appointment as soon as possible for a visit in 3 week(s).   Specialties: Gastroenterology, Internal Medicine Why: Hospital follow up Contact information: [place] [address] [phone]        Brand Males, MD. Schedule an appointment as soon as possible for a visit in 2 week(s).   Specialty: Pulmonary Disease Why: Hospital follow up Contact information: [place] [address] [phone]                The results of significant diagnostics from this hospitalization (including imaging, microbiology, ancillary  and laboratory) are listed below for reference.    Significant Diagnostic Studies: DG Chest 2 View  Result Date: 01/26/2020 CLINICAL DATA:  Shortness of breath.  Cough. EXAM: CHEST - 2 VIEW COMPARISON:  01/06/2020.  12/15/2019.  09/06/2019. FINDINGS: Heart size is normal. Chronic aortic atherosclerotic calcification. The lungs show chronic scarring and emphysema. There is infiltrate and volume loss in the right lower lobe consistent with pneumonia. Small amount of right pleural fluid. Findings are slightly worsened compared to the study of 01/25/2020. IMPRESSION: Background pattern of emphysema and pulmonary scarring. Slight radiographic worsening of pneumonia at the right lung base with a small effusion and right lower lung volume loss. Electronically Signed   By: Nelson Chimes M.D.   On: 01/26/2020 13:45   CT CHEST WO CONTRAST  Result Date: 02/16/2020 CLINICAL DATA:  COPD with pneumonia. EXAM: CT CHEST WITHOUT CONTRAST TECHNIQUE: Multidetector CT imaging of the chest was performed following the standard protocol without IV contrast. COMPARISON:  Insert CT angio chest 12/15/2019 FINDINGS: Cardiovascular: The heart size is normal. No substantial pericardial effusion. Coronary artery calcification is evident. Atherosclerotic calcification is noted in the wall of the thoracic aorta. Ascending thoracic aorta measures 3.9 cm diameter. Mediastinum/Nodes: No mediastinal lymphadenopathy. No evidence for gross hilar lymphadenopathy although assessment is limited by the lack of intravenous contrast on today's study. The esophagus has normal imaging features. There is no axillary lymphadenopathy. Lungs/Pleura: Centrilobular and paraseptal emphysema evident. Architectural distortion consolidative opacity in the right apex is probably related to scarring. Appearance is similar to older CT of 04/07/2018 although consolidative opacity seen on 30/4/3 today is more confluent than on the study from 2 years ago.  Bronchiectasis with new patchy airspace consolidation noted in the inferior right upper lobe anteriorly. There is some patchy airspace disease in the posterior right middle lobe, new in the interval. Interstitial and ground-glass opacity in the right lower lobe is associated with new consolidative airspace disease towards the bas small right pleural effusion is new in the interval. 8 mm posterior left lower lobe nodule on 01/30 3/3 is new since prior. E. Upper Abdomen: Unremarkable. Musculoskeletal: Stable midthoracic compression fractures. No worrisome lytic or sclerotic osseous abnormality. IMPRESSION: 1. Interval development of patchy airspace consolidation in the inferior right upper lobe with new  patchy airspace disease in the posterior right middle lobe and right lower lobe. Imaging features are compatible with multifocal pneumonia. Multifocal neoplasm (adenocarcinoma) considered less likely but close follow-up recommended. 2. New small right pleural effusion. 3. 8 mm posterior left lower lobe pulmonary nodule, new since prior. This may be infectious/inflammatory, but follow-up CT in 3 months recommended to ensure resolution. 4. Architectural distortion consolidative opacity in the right apex is similar to older CT of 04/07/2018 although opacity inferiorly in this region is more confluent than on the study from 2 years ago. This is likely related to evolution of scarring, but continued attention on follow-up recommended. 5. Aortic Atherosclerosis (ICD10-I70.0) and Emphysema (ICD10-J43.9). Electronically Signed   By: Misty Stanley M.D.   On: 02/16/2020 12:58   DG Chest Port 1 View  Result Date: 02/18/2020 CLINICAL DATA:  74 year old male with shortness of breath. EXAM: PORTABLE CHEST 1 VIEW COMPARISON:  Chest radiograph dated 01/27/2020. FINDINGS: There is a small right pleural effusion. Diffuse right lung interstitial coarsening and opacities most consistent with infiltrate and relative the unchanged.  Areas of subpleural scarring noted in the right apex and right lung base with overall decreased volume loss in the right lung. There is background of emphysema. No pneumothorax. The cardiac silhouette is within limits. No acute osseous pathology. IMPRESSION: Small right pleural effusion and right lung infiltrate similar to prior radiograph. Electronically Signed   By: Anner Crete M.D.   On: 02/18/2020 17:07   DG CHEST PORT 1 VIEW  Result Date: 01/27/2020 CLINICAL DATA:  Follow-up pleural effusion EXAM: PORTABLE CHEST 1 VIEW COMPARISON:  01/26/2020 FINDINGS: Cardiac shadow is stable. Diffuse chronic scarring is noted particularly on the right. The right basilar airspace opacity consistent with acute infiltrate is stable. Small right effusion is noted. Left lung is hyperinflated. No bony abnormality is seen. IMPRESSION: Stable right basilar infiltrate and effusion. Electronically Signed   By: Inez Catalina M.D.   On: 01/27/2020 08:25    Microbiology: Recent Results (from the past 240 hour(s))  SARS Coronavirus 2 by RT PCR (hospital order, performed in [facility] hospital lab) Nasopharyngeal Nasopharyngeal Swab     Status: None   Collection Time: 02/18/20  6:10 PM   Specimen: Nasopharyngeal Swab  Result Value Ref Range Status   SARS Coronavirus 2 NEGATIVE NEGATIVE Final    Comment: (NOTE) SARS-CoV-2 target nucleic acids are NOT DETECTED.  The SARS-CoV-2 RNA is generally detectable in upper and lower respiratory specimens during the acute phase of infection. The lowest concentration of SARS-CoV-2 viral copies this assay can detect is 250 copies / mL. A negative result does not preclude SARS-CoV-2 infection and should not be used as the sole basis for treatment or other patient management decisions.  A negative result may occur with improper specimen collection / handling, submission of specimen other than nasopharyngeal swab, presence of viral mutation(s) within the areas targeted by this  assay, and inadequate number of viral copies (<250 copies / mL). A negative result must be combined with clinical observations, patient history, and epidemiological information.  Fact Sheet for Patients:   StrictlyIdeas.no  Fact Sheet for Healthcare Providers: BankingDealers.co.za  This test is not yet approved or  cleared by the [place] FDA and has been authorized for detection and/or diagnosis of SARS-CoV-2 by FDA under an Emergency Use Authorization (EUA).  This EUA will remain in effect (meaning this test can be used) for the duration of the COVID-19 declaration under Section 564(b)(1) of the Act, 21 U.S.C.  section 360bbb-3(b)(1), unless the authorization is terminated or revoked sooner.  Performed at [facility], [place] [address], [place], [place] [address]   Expectorated sputum assessment w rflx to resp cult     Status: None   Collection Time: 02/19/20 12:49 AM   Specimen: Expectorated Sputum  Result Value Ref Range Status   Specimen Description EXPECTORATED SPUTUM  Final   Special Requests NONE  Final   Sputum evaluation   Final    THIS SPECIMEN IS ACCEPTABLE FOR SPUTUM CULTURE Performed at [facility], [place] [address] [place], [place] [address]    Report Status 02/19/2020 FINAL  Final  Culture, respiratory     Status: None   Collection Time: 02/19/20 12:49 AM  Result Value Ref Range Status   Specimen Description EXPECTORATED SPUTUM  Final   Special Requests NONE Reflexed from [id number]  Final   Gram Stain   Final    RARE WBC PRESENT, PREDOMINANTLY PMN RARE SQUAMOUS EPITHELIAL CELLS PRESENT ABUNDANT GRAM NEGATIVE RODS FEW GRAM POSITIVE COCCI FEW GRAM POSITIVE RODS    Culture   Final    ABUNDANT PSEUDOMONAS AERUGINOSA NO STAPHYLOCOCCUS AUREUS ISOLATED Performed at [facility], [place] [address] [place], [place] [address]    Report Status 02/23/2020 FINAL  Final   Organism ID, Bacteria PSEUDOMONAS  AERUGINOSA  Final      Susceptibility   Pseudomonas aeruginosa - MIC*    CEFTAZIDIME 2 SENSITIVE Sensitive     CIPROFLOXACIN <=0.25 SENSITIVE Sensitive     GENTAMICIN <=1 SENSITIVE Sensitive     IMIPENEM 2 SENSITIVE Sensitive     PIP/TAZO <=4 SENSITIVE Sensitive     CEFEPIME 0.25 SENSITIVE Sensitive     * ABUNDANT PSEUDOMONAS AERUGINOSA  Culture, blood (routine x 2)     Status: Abnormal   Collection Time: 02/19/20  4:46 AM   Specimen: BLOOD RIGHT HAND  Result Value Ref Range Status   Specimen Description BLOOD RIGHT HAND  Final   Special Requests   Final    BOTTLES DRAWN AEROBIC AND ANAEROBIC Blood Culture adequate volume   Culture  Setup Time   Final    GRAM POSITIVE COCCI IN CLUSTERS AEROBIC BOTTLE ONLY CRITICAL RESULT CALLED TO, READ BACK BY AND VERIFIED WITH: G. ABBOTT,PHARMD [id number] 02/21/2020 T. TYSOR    Culture (A)  Final    STAPHYLOCOCCUS CAPITIS THE SIGNIFICANCE OF ISOLATING THIS ORGANISM FROM A SINGLE SET OF BLOOD CULTURES WHEN MULTIPLE SETS ARE DRAWN IS UNCERTAIN. PLEASE NOTIFY THE MICROBIOLOGY DEPARTMENT WITHIN ONE WEEK IF SPECIATION AND SENSITIVITIES ARE REQUIRED. Performed at [facility], [place] [address] [place],  [address]    Report Status 02/21/2020 FINAL  Final  Blood Culture ID Panel (Reflexed)     Status: Abnormal   Collection Time: 02/19/20  4:46 AM  Result Value Ref Range Status   Enterococcus faecalis NOT DETECTED NOT DETECTED Final   Enterococcus Faecium NOT DETECTED NOT DETECTED Final   Listeria monocytogenes NOT DETECTED NOT DETECTED Final   Staphylococcus species DETECTED (A) NOT DETECTED Final    Comment: CRITICAL RESULT CALLED TO, READ BACK BY AND VERIFIED WITH: G. ABBOTT,PHARMD [id number] 02/21/2020 T. TYSOR    Staphylococcus aureus (BCID) NOT DETECTED NOT DETECTED Final   Staphylococcus epidermidis NOT DETECTED NOT DETECTED Final   Staphylococcus lugdunensis NOT DETECTED NOT DETECTED Final   Streptococcus species NOT DETECTED NOT DETECTED  Final   Streptococcus agalactiae NOT DETECTED NOT DETECTED Final   Streptococcus pneumoniae NOT DETECTED  NOT DETECTED Final   Streptococcus pyogenes NOT DETECTED NOT DETECTED Final   A.calcoaceticus-baumannii NOT DETECTED NOT DETECTED Final   Bacteroides fragilis NOT DETECTED NOT DETECTED Final   Enterobacterales NOT DETECTED NOT DETECTED Final   Enterobacter cloacae complex NOT DETECTED NOT DETECTED Final   Escherichia coli NOT DETECTED NOT DETECTED Final   Klebsiella aerogenes NOT DETECTED NOT DETECTED Final   Klebsiella oxytoca NOT DETECTED NOT DETECTED Final   Klebsiella pneumoniae NOT DETECTED NOT DETECTED Final   Proteus species NOT DETECTED NOT DETECTED Final   Salmonella species NOT DETECTED NOT DETECTED Final   Serratia marcescens NOT DETECTED NOT DETECTED Final   Haemophilus influenzae NOT DETECTED NOT DETECTED Final   Neisseria meningitidis NOT DETECTED NOT DETECTED Final   Pseudomonas aeruginosa NOT DETECTED NOT DETECTED Final   Stenotrophomonas maltophilia NOT DETECTED NOT DETECTED Final   Candida albicans NOT DETECTED NOT DETECTED Final   Candida auris NOT DETECTED NOT DETECTED Final   Candida glabrata NOT DETECTED NOT DETECTED Final   Candida krusei NOT DETECTED NOT DETECTED Final   Candida parapsilosis NOT DETECTED NOT DETECTED Final   Candida tropicalis NOT DETECTED NOT DETECTED Final   Cryptococcus neoformans/gattii NOT DETECTED NOT DETECTED Final    Comment: Performed at [facility], [address] [place], [place] [address]  Culture, blood (routine x 2)     Status: None   Collection Time: 02/19/20  4:48 AM   Specimen: BLOOD RIGHT ARM  Result Value Ref Range Status   Specimen Description BLOOD RIGHT ARM  Final   Special Requests   Final    BOTTLES DRAWN AEROBIC AND ANAEROBIC Blood Culture results may not be optimal due to an inadequate volume of blood received in culture bottles   Culture   Final    NO GROWTH 5 DAYS Performed at [facility],  [place] [address], [place], [place] [address]    Report Status 02/24/2020 FINAL  Final     Labs: Basic Metabolic Panel: Recent Labs  Lab 02/20/20 0627 02/20/20 0627 02/21/20 0406 02/22/20 0429 02/23/20 0911 02/24/20 0349 02/25/20 0459  NA 139   < > 137 139 141 140 137  K 3.3*   < > 3.3* 3.3* 3.1* 3.9 3.6  CL 100   < > 100 104 105 106 104  CO2 30   < > 29 27 29 27 27   GLUCOSE 82   < > 87 94 114* 96 90  BUN 13   < > 8 8 6* 8 7*  CREATININE 0.87   < > 0.84 0.82 0.92 1.12 0.83  CALCIUM 8.1*   < > 7.7* 7.8* 7.7* 7.5* 7.7*  MG 1.9  --  1.9 1.8 1.9  --  1.8  PHOS 3.0  --  2.7 1.7* 2.1*  --  2.2*   < > = values in this interval not displayed.   Liver Function Tests: Recent Labs  Lab 02/18/20 1645 02/19/20 0452  AST 23 15  ALT 20 15  ALKPHOS 68 56  BILITOT 0.6 0.8  PROT 5.7* 4.8*  ALBUMIN 2.8* 2.4*   No results for input(s): LIPASE, AMYLASE in the last 168 hours. No results for input(s): AMMONIA in the last 168 hours. CBC: Recent Labs  Lab 02/18/20 1645 02/19/20 0447 02/19/20 1142 02/19/20 1903 02/20/20 5885 02/20/20 0277 02/21/20 0406 02/21/20 1745 02/22/20 0429 02/22/20 1426 02/23/20 0911 02/24/20 0349 02/25/20 0459  WBC 11.6*   < > 10.4  --  8.2  --  7.1  --  7.9  --  8.6  --   --   NEUTROABS 9.7*  --   --   --   --   --   --   --   --   --   --   --   --   HGB 5.0*   < > 8.2*   < > 7.8*   < > 8.0*   < > 7.6* 8.4* 7.9* 7.7* 7.7*  HCT 18.1*   < > 25.7*   < > 24.9*   < > 24.8*   < > 24.6* 27.3* 26.8* 25.8* 26.0*  MCV 92.8   < > 86.0  --  86.5  --  87.9  --  88.5  --  91.8  --   --   PLT 339   < > 266  --  243  --  226  --  218  --  190  --   --    < > = values in this interval not displayed.   Cardiac Enzymes: No results for input(s): CKTOTAL, CKMB, CKMBINDEX, TROPONINI in the last 168 hours. BNP: BNP (last 3 results) Recent Labs    12/15/19 1136  BNP 186.4*    ProBNP (last 3 results) No results for input(s): PROBNP in the last 8760 hours.  CBG: No  results for input(s): GLUCAP in the last 168 hours.     Signed:  Cristal Ford  [facility] 02/25/2020, 11:48 AM

## 2020-02-25 NOTE — Progress Notes (Signed)
D/C instructions printed and placed in packet at nurse's stations. Pt to receive PRBC prior to D/C.

## 2020-02-25 NOTE — Progress Notes (Signed)
PT Cancellation Note  Patient Details Name: ALMON WHITFORD MRN: [id number] DOB: 04-16-1946   Cancelled Treatment:    Reason Eval/Treat Not Completed: Other (comment) (Pt going home and doesnt want to exert himself. Has been up.)   Denice Paradise 02/25/2020, 3:17 PM Arav Bannister W,PT Acute Rehabilitation Services Pager:  [phone]  Office:  [phone]

## 2020-02-25 NOTE — Telephone Encounter (Signed)
Pt is still currently admitted in the hospital. Will continue to watch for discharge so we can get his hospital follow up scheduled.

## 2020-02-26 LAB — BPAM RBC
Blood Product Expiration Date: 202109112359
ISSUE DATE / TIME: 202108201330
Unit Type and Rh: 6200

## 2020-02-26 LAB — TYPE AND SCREEN
ABO/RH(D): A POS
Antibody Screen: NEGATIVE
Unit division: 0

## 2020-02-27 ENCOUNTER — Encounter: Payer: Self-pay | Admitting: Gastroenterology

## 2020-02-28 ENCOUNTER — Telehealth: Payer: Self-pay | Admitting: Gastroenterology

## 2020-02-28 ENCOUNTER — Telehealth: Payer: Self-pay

## 2020-02-28 NOTE — TOC Progression Note (Signed)
Transition of Care [facility]) - Progression Note    Patient Details  Name: MOOSA BUECHE MRN: [id number] Date of Birth: April 15, 1946  Transition of Care [facility]) CM/SW Contact  Zenon Mayo, RN Phone Number: 02/28/2020, 10:19 AM  Clinical Narrative:    NCM received call from [place] with [facility], she states patient wants a transport chair , states wife says the chair is too heavy for her to pick up.  NCM contacted wife and she states the wheelchair is heavy and would like a transport chair.  NCM will check with Lorie to see if they can switch out to transport chair.      Barriers to Discharge: No Barriers Identified  Expected Discharge Plan and Services           Expected Discharge Date: 02/25/20                 DME Agency: NA       HH Arranged: RN, PT, OT [place] Agency: [place] ([place]) Date [place]: 02/22/20 Time [place]: 1000 Representative spoke with at [place]: [place] ([place]) Interventions    Readmission Risk Interventions Readmission Risk Prevention Plan 01/31/2020  Transportation Screening Complete  PCP or Specialist Appt within 5-7 Days Complete  Home Care Screening Complete  Medication Review (RN CM) Complete  Some recent data might be hidden

## 2020-02-28 NOTE — Telephone Encounter (Signed)
Pt has been scheduled an appt with [name] 9/7. Nothing further needed.

## 2020-02-28 NOTE — Telephone Encounter (Signed)
Spoke with patient's wife, she is aware that a letter was sent out yesterday and it stated to have a follow up within the next 6 weeks. Pt is scheduled for next available follow up on 04/27/20 at 3:10 pm with Dr. Rush Landmark, pt will be placed on cancellation list as well. Advised that if anything changes or if they have any new concerns before the appt to give [place] a call.

## 2020-02-28 NOTE — Telephone Encounter (Signed)
Patients wife is calling, states had procedure in hospital for a GI Bleed last Sunday. States they were told to follow up with him in 3 weeks, let them know Dr. Rush Landmark is booked until October. She is asking if Dr. Rush Landmark is okay with waiting until October or if they need to get in within the next couple weeks.

## 2020-02-28 NOTE — Telephone Encounter (Signed)
Noted thanks °

## 2020-02-28 NOTE — Telephone Encounter (Signed)
Transition Care Management Follow-up Telephone Call  Date of discharge and from where: 02/25/20. [place]  How have you been since you were released from the hospital? Doing really well, walking much better.  Any questions or concerns? No  Items Reviewed:  Did the pt receive and understand the discharge instructions provided? Yes   Medications obtained and verified? Yes   Any new allergies since your discharge? No   Dietary orders reviewed? Yes  Do you have support at home? Yes   Functional Questionnaire: (I = Independent and D = Dependent) ADLs: I  Bathing/Dressing- I  Meal Prep- D  Eating- I  Maintaining continence- I  Transferring/Ambulation- I  Managing Meds- I  Follow up appointments reviewed:   PCP Hospital f/u appt confirmed? Yes  Scheduled to see Dr. Yong Channel on 03/10/20 @ [facility] f/u appt confirmed? Yes  Scheduled to see [name] on 03/14/20 @ 11am.  Are transportation arrangements needed? No   If their condition worsens, is the pt aware to call PCP or go to the Emergency Dept.? Yes  Was the patient provided with contact information for the PCP's office or ED? Yes  Was to pt encouraged to call back with questions or concerns? Yes

## 2020-02-29 ENCOUNTER — Telehealth: Payer: Self-pay | Admitting: Pulmonary Disease

## 2020-02-29 ENCOUNTER — Other Ambulatory Visit: Payer: Self-pay

## 2020-02-29 DIAGNOSIS — E785 Hyperlipidemia, unspecified: Secondary | ICD-10-CM

## 2020-02-29 DIAGNOSIS — I10 Essential (primary) hypertension: Secondary | ICD-10-CM

## 2020-02-29 MED ORDER — SPIRIVA RESPIMAT 2.5 MCG/ACT IN AERS: INHALATION_SPRAY | RESPIRATORY_TRACT | 3 refills | Status: DC

## 2020-02-29 MED ORDER — SPIRIVA RESPIMAT 2.5 MCG/ACT IN AERS: 2.0000 | INHALATION_SPRAY | Freq: Every day | RESPIRATORY_TRACT | 0 refills | Status: DC

## 2020-02-29 NOTE — Telephone Encounter (Signed)
Ok to change to spiriva and symbicort

## 2020-02-29 NOTE — Telephone Encounter (Signed)
MR please advise-  Patient was on Breztri but states it is not working well for him.  Pt wants to go back to Spiriva and Symbicort.  Ok to switch back? Thanks!

## 2020-02-29 NOTE — Telephone Encounter (Signed)
Called and spoke with Patient's Wife, Katy Apo. Patient needs Spiriva refill sent to [facility], 90 days supply.  Patient received message from [facility] that Symbicort is scheduled for delivery. Lillie asked for Spiriva 2.5 sample to have until inhaler arrives from [place]. Per Dr [name], ok to change to Spiriva and Symbicort. Spiriva 2.5 prescription sent, and sample left at front desk for pick up. Nothing further at this time.

## 2020-02-29 NOTE — Telephone Encounter (Signed)
OK to refill   Francisco [name]

## 2020-02-29 NOTE — Telephone Encounter (Signed)
Francisco [name], Patient is requesting a refill for Spiriva respimat.  Stated he is not using the [name] any longer.  Please advise if ok to refill.  Thank you.

## 2020-03-02 LAB — COMPREHENSIVE METABOLIC PANEL
Calcium: 8.6 — AB (ref 8.7–10.7)
GFR calc non Af Amer: 87

## 2020-03-02 LAB — BASIC METABOLIC PANEL
BUN: 14 (ref 4–21)
CO2: 29 — AB (ref 13–22)
Chloride: 99 (ref 99–108)
Creatinine: 0.8 (ref 0.6–1.3)
Glucose: 90
Potassium: 4.4 (ref 3.4–5.3)
Sodium: 140 (ref 137–147)

## 2020-03-02 LAB — CBC AND DIFFERENTIAL
HCT: 35 — AB (ref 41–53)
Hemoglobin: 11 — AB (ref 13.5–17.5)
Neutrophils Absolute: 6
WBC: 9

## 2020-03-02 LAB — CBC: RBC: 3.94 (ref 3.87–5.11)

## 2020-03-03 ENCOUNTER — Encounter: Payer: Self-pay | Admitting: Family Medicine

## 2020-03-09 ENCOUNTER — Encounter: Payer: Self-pay | Admitting: Vascular Surgery

## 2020-03-09 ENCOUNTER — Other Ambulatory Visit: Payer: Self-pay

## 2020-03-09 ENCOUNTER — Ambulatory Visit (INDEPENDENT_AMBULATORY_CARE_PROVIDER_SITE_OTHER): Payer: Self-pay | Admitting: Vascular Surgery

## 2020-03-09 VITALS — BP 102/67 | HR 110 | Temp 97.8°F | Resp 20

## 2020-03-09 DIAGNOSIS — I739 Peripheral vascular disease, unspecified: Secondary | ICD-10-CM

## 2020-03-09 NOTE — Progress Notes (Signed)
Patient is a 74 year old male who returns for follow-up today.  He underwent left femoral endarterectomy for rest pain and early tissue loss July 2021.  He had previously undergone right common femoral endarterectomy October 2020.  He was recently admitted to the hospital for transfusion.  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  The plan at discharge was to resume his Xarelto and then potentially his aspirin if he had no further bleeding.   He recently resumed low-dose Eliquis.  He has not had any further bleeding.  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  Patient will follow up with me in 6 weeks time to recheck his right foot.  He'll follow-up sooner if he has any signs or symptoms of infection or progression of the gangrenous changes.  Ruta Hinds, MD [facility] Office: [phone]

## 2020-03-10 ENCOUNTER — Ambulatory Visit (INDEPENDENT_AMBULATORY_CARE_PROVIDER_SITE_OTHER): Payer: Medicare Other | Admitting: Family Medicine

## 2020-03-10 ENCOUNTER — Encounter: Payer: Self-pay | Admitting: Family Medicine

## 2020-03-10 VITALS — BP 110/74 | HR 100 | Temp 98.6°F | Ht 72.0 in | Wt 153.0 lb

## 2020-03-10 DIAGNOSIS — K922 Gastrointestinal hemorrhage, unspecified: Secondary | ICD-10-CM

## 2020-03-10 DIAGNOSIS — E785 Hyperlipidemia, unspecified: Secondary | ICD-10-CM

## 2020-03-10 DIAGNOSIS — I10 Essential (primary) hypertension: Secondary | ICD-10-CM | POA: Diagnosis not present

## 2020-03-10 DIAGNOSIS — Z23 Encounter for immunization: Secondary | ICD-10-CM

## 2020-03-10 DIAGNOSIS — I2782 Chronic pulmonary embolism: Secondary | ICD-10-CM

## 2020-03-10 DIAGNOSIS — I739 Peripheral vascular disease, unspecified: Secondary | ICD-10-CM

## 2020-03-10 DIAGNOSIS — J449 Chronic obstructive pulmonary disease, unspecified: Secondary | ICD-10-CM

## 2020-03-10 MED ORDER — APIXABAN 2.5 MG PO TABS
2.5000 mg | ORAL_TABLET | Freq: Two times a day (BID) | ORAL | 3 refills | Status: DC
Start: 2020-03-10 — End: 2020-12-20

## 2020-03-10 MED ORDER — PANTOPRAZOLE SODIUM 40 MG PO TBEC
40.0000 mg | DELAYED_RELEASE_TABLET | Freq: Two times a day (BID) | ORAL | 3 refills | Status: DC
Start: 2020-03-10 — End: 2020-08-15

## 2020-03-10 MED ORDER — ROSUVASTATIN CALCIUM 10 MG PO TABS: 10.0000 mg | ORAL_TABLET | Freq: Every day | ORAL | 5 refills | Status: DC

## 2020-03-10 NOTE — Patient Instructions (Addendum)
Health Maintenance Due  Topic Date Due   INFLUENZA VACCINE high dose flu shot today 02/06/2020   Trial rosuvastatin 10 mg once a week for 2 weeks-if you okay with this can increase to every other day for 2 weeks and then go to daily.  Obviously stop if you have recurrent rash like you did with atorvastatin  If I do not include a follow up date for home health bloodwork recheck on his message- please reach back out. I am leaning towards 2-3 weeks.   Please stop by lab before you go If you have mychart- we will send your results within 3 business days of [place] receiving them.  If you do not have mychart- we will call you about results within 5 business days of [place] receiving them.  *please note we are currently using [facility] which has a longer processing time than [place] typically so labs may not come back as quickly as in the past *please also note that you will see labs on mychart as soon as they post. I will later go in and write notes on them- will say "notes from Dr. Yong Channel"  Recommended follow up: keep November visit or sooner

## 2020-03-10 NOTE — Progress Notes (Signed)
Phone [phone]   Subjective:  Francisco [name] is a 74 y.o. year old very pleasant male patient who presents for transitional care management and hospital follow up for acute anemia related to GI bleed. Patient was hospitalized from 02/18/2020 to 02/25/2020. A TCM phone call was completed on 02/28/2020 . Medical complexity moderate  74 year old medically complex patient with advanced COPD, chronic respiratory failure on 4 L of oxygen, diastolic heart failure, peripheral vascular disease with endarterectomy right femoral artery 2020 and left femoral artery in 2021, history of pulmonary embolism on chronic anticoagulation with Xarelto who was sent to the hospital with acute anemia by vascular surgeon [name].  Hemoglobin down to 5.0 at clinic with [name] on August 11.  Upon arrival in the emergency room this was confirmed at 5.9 down from 9.5 in July.  Patient was given 2 units of packed red blood cells.  Patient had an INR of 2.4.  80 mg of Protonix were administered.  Patient was also given 500 cc of saline.  Patient reported after his discharge from his vascular surgery in July he was having frequent bouts of black tarry stools most days.  Patient developed worsening generalized weakness and shortness of breath above his baseline as well as lightheadedness.  He was requiring assist eating get out of his bed or chair when he typically can ambulate on his own.  Patient was on both Xarelto and aspirin prior to admission.  Hemoglobin improved to 7.7.  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  Patient was treated with argon plasma coagulation (APC).  Clips (MR conditional) were also placed.  Patient also noted to have duodenitis with 3 areas of active oozing also treated with argon plasma coagulation as well as clips-MR conditional.  GI recommended high-dose PPI twice daily for 2 months and then daily thereafter.  They also recommended 72 hours after  treatment could restart Xarelto and then later aspirin as long as hemoglobin and hematocrit were monitored closely.  Of note due to deconditioning patient was given an additional unit of blood before discharge  Patient also had lactic acidosis which was thought related to anemia which resolved before discharge.  For chronic respiratory failure with hypoxia/stage IV severe COPD-patient was continued on home oxygen of 3 to 4 L.  He was continued on home nebulizers and inhalers.  Patient follows with [name] of pulmonology..  There was a lung infiltrate on CT-patient followed regularly for frequent bouts of bronchitis- plan was for follow up with [name] (patchy airspace diseae, 8 mm pulmonary nodule with 3 month follow up recommended) .  Patient still DNR  In regards to his peripheral arterial disease-patient will continue close follow-up with vascular surgery after his previous seizure in July.  He was restarted on Eliquis with plans to reconsider aspirin restart at a later date. Patient saw [name] yesterday-[name] noted continue Eliquis but did not mention starting aspirin back yet.  He was noted to have low potassium and low phosphate-was given a dose of K-Phos.  Was recommended to have repeat BMP and phosphorus level  Plans for outpatient follow-up included gastroenterology, pulmonology, infectious disease per discharge summary.  I do not see why patient has to see infectious disease-we did not order at this time and I do not see that this has been ordered.    lipitor broke out in rash and this was stopped- itching on chest.   Gaining strength- back walking with  walker at home. Working with physical therapy.   Home health labs on 8/26 with hgb up to 11. He has felt much stronger.   Breathing has been much better- we opted to do high dose flu shot today  [facility].  Patient and family would like to repeat CBC in 2 to 3 weeks- I think that's  very reasonable   See problem oriented charting as well  Past Medical History-  Patient Active Problem List   Diagnosis Date Noted  . Acute GI bleeding 02/18/2020    Priority: High  . PAD (peripheral artery disease) ([place]) 12/11/2018    Priority: High  . Stage 4 very severe COPD by GOLD classification ([place]) 11/27/2018    Priority: High  . DNR (do not resuscitate) 04/21/2018    Priority: High  . Chronic pulmonary embolism ([place]) 04/07/2018    Priority: High  . Osteoporosis 11/19/2017    Priority: High  . Thoracic compression fracture ([place]) 11/02/2017    Priority: High  . Former smoker 08/23/2008    Priority: High  . Essential tremor 03/31/2018    Priority: Medium  . BPPV (benign paroxysmal positional vertigo), right 02/10/2018    Priority: Medium  . Essential hypertension 11/02/2017    Priority: Medium  . BPH associated with nocturia 06/25/2017    Priority: Medium  . Hyperglycemia 06/12/2016    Priority: Medium  . Hyperlipidemia 07/22/2014    Priority: Medium  . Pre-operative respiratory examination 12/10/2018    Priority: Low  . Venous stasis dermatitis of both lower extremities 11/02/2017    Priority: Low  . Chronic respiratory failure with hypoxia ([place]) 09/23/2017    Priority: Low  . Leukocytosis 12/19/2016    Priority: Low  . Onychomycosis 07/22/2014    Priority: Low  . Left ankle swelling 07/22/2014    Priority: Low  . History of skin cancer 05/17/2014    Priority: Low  . Chronic rhinitis 10/28/2012    Priority: Low  . Pulmonary nodule 09/23/2011    Priority: Low  . History of colonic polyps 08/23/2008    Priority: Low  . Lung infiltrate on CT 02/18/2020  . Lactic acidosis 02/18/2020  . Diastolic dysfunction 51/88/4166    Medications- reviewed and updated  A medical reconciliation was performed comparing current medicines to hospital discharge medications. Current Outpatient Medications  Medication Sig Dispense Refill  . acetaminophen (TYLENOL) 500 MG  tablet Take 1,000 mg by mouth every 6 (six) hours as needed for moderate pain.     [name] Kitchen albuterol (VENTOLIN HFA) 108 (90 Base) MCG/ACT inhaler USE 2 PUFFS EVERY 6 HOURS  AS NEEDED FOR WHEEZING 18 g 3  . apixaban (ELIQUIS) 2.5 MG TABS tablet Take 1 tablet (2.5 mg total) by mouth 2 (two) times daily. 180 tablet 3  . azithromycin (ZITHROMAX) 250 MG tablet Take 1 tablet (250 mg total) by mouth every Monday, Wednesday, and Friday. 45 tablet 1  . budesonide-formoterol (SYMBICORT) 160-4.5 MCG/ACT inhaler Inhale 2 puffs into the lungs 2 (two) times daily.    . Calcium Carbonate-Vitamin D (CALCIUM 600+D PO) Take 2 tablets by mouth daily.    . cycloSPORINE (RESTASIS) 0.05 % ophthalmic emulsion Place 1 drop into both eyes 2 (two) times daily as needed (dry eyes).     . fluticasone (FLONASE) 50 MCG/ACT nasal spray Place 2 sprays into both nostrils daily. 48 g 3  . furosemide (LASIX) 20 MG tablet TAKE 1 TABLET BY MOUTH  DAILY (Patient taking differently: Take 20  mg by mouth daily. ) 90 tablet 3  . Multiple Vitamin (MULTIVITAMIN WITH MINERALS) TABS tablet Take 1 tablet by mouth daily.    . OXYGEN Inhale 4-5 L into the lungs continuous.     . pantoprazole (PROTONIX) 40 MG tablet Take 1 tablet (40 mg total) by mouth 2 (two) times daily. 180 tablet 3  . predniSONE (DELTASONE) 10 MG tablet Take 1 tablet (10 mg total) by mouth daily with breakfast. 90 tablet 0  . Respiratory Therapy Supplies (FLUTTER) DEVI 10 times Twice a day and prn as needed, may increase if feeling worse 1 each 0  . roflumilast (DALIRESP) 500 MCG TABS tablet Take 1 tablet (500 mcg total) by mouth daily. 90 tablet 2  . sodium chloride HYPERTONIC 3 % nebulizer solution Take by nebulization daily. (Patient taking differently: Take 4 mLs by nebulization daily as needed (to loosen phlegm). ) 90 mL 12  . Tiotropium Bromide Monohydrate (SPIRIVA RESPIMAT) 2.5 MCG/ACT AERS USE 2 INHALATIONS BY MOUTH  ONCE DAILY 12 g 3  . Tiotropium Bromide Monohydrate  (SPIRIVA RESPIMAT) 2.5 MCG/ACT AERS Inhale 2 puffs into the lungs daily. 4 g 0  . traMADol (ULTRAM) 50 MG tablet Take 1 tablet (50 mg total) by mouth every 12 (twelve) hours as needed for moderate pain or severe pain. (Patient taking differently: Take 50 mg by mouth See admin instructions. Take one tablet (50 mg) by mouth daily at bedtime, may also take one tablet (50 mg) 12 hours later if still needed for pain) 20 tablet 1  . atorvastatin (LIPITOR) 40 MG tablet Take 1 tablet (40 mg total) by mouth daily. (Patient not taking: Reported on 03/10/2020) 30 tablet 0  . folic acid (FOLVITE) 1 MG tablet Take 1 tablet (1 mg total) by mouth daily. (Patient not taking: Reported on 03/10/2020)    . rosuvastatin (CRESTOR) 10 MG tablet Take 1 tablet (10 mg total) by mouth daily. 30 tablet 5  . thiamine 100 MG tablet Take 1 tablet (100 mg total) by mouth daily. (Patient not taking: Reported on 03/10/2020)     No current facility-administered medications for this visit.   Objective  Objective:  BP 110/74   Pulse 100   Temp 98.6 F (37 C) (Temporal)   Ht 6' (1.829 m)   Wt 153 lb (69.4 kg)   SpO2 99%   BMI 20.75 kg/m  Gen: NAD, resting comfortably, wearing 4 L oxygen CV: RRR no murmurs rubs or gallops Lungs: CTAB no crackles, wheeze, rhonchi Abdomen: soft/nontender/nondistended/normal bowel sounds. No rebound or guarding.  Ext: 1-2+ edema (goes down at night) Skin: warm, dry Neuro: in wheelchair   Assessment and Plan:   TCM/hospital follow-up  Acute GI bleeding Significant GI bleed/melena with hemoglobin of 5 from 9.5 on prior check during hospitalization.  Requiring urgent GI intervention-thankfully able to stabilize with argon plasma coagulation as well as clips.  Patient remains on high-dose PPI twice daily at least until GI follow-up-I refilled this today.  Plan after 2 months is to consider transition to once daily.  Patient was on Xarelto and aspirin prior to hospitalization but right now is only  on Eliquis 2.5 mg twice daily.  We will hold on aspirin for now unless GI or vascular surgery recommends restart.  Update CBC with labs today.  We will plan on repeating CBC likely in 2 to 3 weeks through home health.  PAD (peripheral artery disease) (HCC) Continue close follow-up with [name].  Remain off aspirin for now unless  recommended by GI or vascular surgery.  Continue Eliquis 2.5 mg twice daily.  Patient also agreed to start statin especially since we are holding on aspirin for now  Chronic pulmonary embolism ([place]) Due to high GI bleed risk patient was transitioned to Eliquis 2.5 mg twice daily.  We discussed this modify doses primarily for A. fib for stroke prevention. Up to date also lists "Indefinite anticoagulation (reduced-intensity dosing for prophylaxis against venous thromboembolism recurrence):.[name] KitchenMarland KitchenOral: 2.5 mg twice daily " since patient's pulmonary embolism was thought related to sedentary activity I think this lower dose is reasonable.  I still warned him if he had increased shortness of breath on that baseline or swelling in the calves above baseline to let us know immediately  Stage 4 very severe COPD by GOLD classification ([place]) Appears largely stable.  Continue inhalers as above and pulmonary follow-up.  We discussed how anemia has likely contributed to prior shortness of breath-his shortness of breath has improved from prior baseline-they also discussed this at the hospital  Hyperlipidemia Rash on chest on Lipitor.  I think with history of peripheral arterial disease we need to try an alternate statin-rosuvastatin was ordered we discussed if has recurrent rash he can stop this.  We will start with once a week for 2 weeks and if does well with that can increase to 3 times a week for 2 weeks and then daily   Recommended follow up: We will keep November follow-up-sooner if needed Future Appointments  Date Time Provider [place]  03/14/2020 11:00 AM Lauraine Rinne, NP  [facility] None  04/20/2020 10:20 AM Elam Dutch, MD [facility]  04/27/2020  3:10 PM Mansouraty, Telford Nab., MD [facility]  06/06/2020 10:40 AM Yong Channel [name], MD [facility]    Lab/Order associations:   ICD-10-CM   1. Essential hypertension  I10 CBC With Differential/Platelet    COMPLETE METABOLIC PANEL WITH GFR    COMPLETE METABOLIC PANEL WITH GFR    CBC With Differential/Platelet  2. Need for immunization against influenza  Z23 Flu Vaccine QUAD High Dose(Fluad)  3. Acute GI bleeding  K92.2   4. PAD (peripheral artery disease) (HCC)  I73.9   5. Other chronic pulmonary embolism, unspecified whether acute cor pulmonale present (HCC)  I27.82   6. Stage 4 very severe COPD by GOLD classification ([place])  J44.9   7. Hyperlipidemia, unspecified hyperlipidemia type  E78.5   8. 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 rosuvastatin (CRESTOR) 10 MG tablet    Sig: Take 1 tablet (10 mg total) by mouth daily.    Dispense:  30 tablet    Refill:  5  . apixaban (ELIQUIS) 2.5 MG TABS tablet    Sig: Take 1 tablet (2.5 mg total) by mouth 2 (two) times daily.    Dispense:  180 tablet    Refill:  3  . pantoprazole (PROTONIX) 40 MG tablet    Sig: Take 1 tablet (40 mg total) by mouth 2 (two) times daily.    Dispense:  180 tablet    Refill:  3    Return precautions advised.  Garret Reddish, MD

## 2020-03-11 LAB — COMPLETE METABOLIC PANEL WITH GFR
AG Ratio: 1.3 (calc) (ref 1.0–2.5)
ALT: 15 U/L (ref 9–46)
AST: 20 U/L (ref 10–35)
Albumin: 3.6 g/dL (ref 3.6–5.1)
Alkaline phosphatase (APISO): 74 U/L (ref 35–144)
BUN: 17 mg/dL (ref 7–25)
CO2: 27 mmol/L (ref 20–32)
Calcium: 9.3 mg/dL (ref 8.6–10.3)
Chloride: 100 mmol/L (ref 98–110)
Creat: 1.05 mg/dL (ref 0.70–1.18)
GFR, Est African American: 82 mL/min/{1.73_m2} (ref >=60)
GFR, Est Non African American: 71 mL/min/{1.73_m2} (ref >=60)
Globulin: 2.8 g/dL (calc) (ref 1.9–3.7)
Glucose, Bld: 99 mg/dL (ref 65–99)
Potassium: 4.9 mmol/L (ref 3.5–5.3)
Sodium: 139 mmol/L (ref 135–146)
Total Bilirubin: 0.5 mg/dL (ref 0.2–1.2)
Total Protein: 6.4 g/dL (ref 6.1–8.1)

## 2020-03-11 LAB — CBC WITH DIFFERENTIAL/PLATELET
Absolute Monocytes: 504 cells/uL (ref 200–950)
Basophils Absolute: 42 cells/uL (ref 0–200)
Basophils Relative: 0.4 %
Eosinophils Absolute: 53 cells/uL (ref 15–500)
Eosinophils Relative: 0.5 %
HCT: 36.6 % — ABNORMAL LOW (ref 38.5–50.0)
Hemoglobin: 11.6 g/dL — ABNORMAL LOW (ref 13.2–17.1)
Lymphs Abs: 567 cells/uL — ABNORMAL LOW (ref 850–3900)
MCH: 27.8 pg (ref 27.0–33.0)
MCHC: 31.7 g/dL — ABNORMAL LOW (ref 32.0–36.0)
MCV: 87.8 fL (ref 80.0–100.0)
MPV: 8.8 fL (ref 7.5–12.5)
Monocytes Relative: 4.8 %
Neutro Abs: 9335 cells/uL — ABNORMAL HIGH (ref 1500–7800)
Neutrophils Relative %: 88.9 %
Platelets: 255 10*3/uL (ref 140–400)
RBC: 4.17 10*6/uL — ABNORMAL LOW (ref 4.20–5.80)
RDW: 14.2 % (ref 11.0–15.0)
Total Lymphocyte: 5.4 %
WBC: 10.5 10*3/uL (ref 3.8–10.8)

## 2020-03-11 LAB — PHOSPHORUS: Phosphorus: 3.4 mg/dL (ref 2.1–4.3)

## 2020-03-11 NOTE — Assessment & Plan Note (Signed)
Continue close follow-up with [name].  Remain off aspirin for now unless recommended by GI or vascular surgery.  Continue Eliquis 2.5 mg twice daily.  Patient also agreed to start statin especially since we are holding on aspirin for now

## 2020-03-11 NOTE — Assessment & Plan Note (Signed)
Rash on chest on Lipitor.  I think with history of peripheral arterial disease we need to try an alternate statin-rosuvastatin was ordered we discussed if has recurrent rash he can stop this.  We will start with once a week for 2 weeks and if does well with that can increase to 3 times a week for 2 weeks and then daily

## 2020-03-11 NOTE — Assessment & Plan Note (Signed)
Appears largely stable.  Continue inhalers as above and pulmonary follow-up.  We discussed how anemia has likely contributed to prior shortness of breath-his shortness of breath has improved from prior baseline-they also discussed this at the hospital

## 2020-03-11 NOTE — Assessment & Plan Note (Signed)
Due to high GI bleed risk patient was transitioned to Eliquis 2.5 mg twice daily.  We discussed this modify doses primarily for A. fib for stroke prevention. Up to date also lists "Indefinite anticoagulation (reduced-intensity dosing for prophylaxis against venous thromboembolism recurrence):.Francisco KitchenMarland KitchenOral: 2.5 mg twice daily " since patient's pulmonary embolism was thought related to sedentary activity I think this lower dose is reasonable.  I still warned him if he had increased shortness of breath on that baseline or swelling in the calves above baseline to let us know immediately

## 2020-03-11 NOTE — Assessment & Plan Note (Signed)
Significant GI bleed/melena with hemoglobin of 5 from 9.5 on prior check during hospitalization.  Requiring urgent GI intervention-thankfully able to stabilize with argon plasma coagulation as well as clips.  Patient remains on high-dose PPI twice daily at least until GI follow-up-I refilled this today.  Plan after 2 months is to consider transition to once daily.  Patient was on Xarelto and aspirin prior to hospitalization but right now is only on Eliquis 2.5 mg twice daily.  We will hold on aspirin for now unless GI or vascular surgery recommends restart.  Update CBC with labs today.  We will plan on repeating CBC likely in 2 to 3 weeks through home health.

## 2020-03-14 ENCOUNTER — Other Ambulatory Visit: Payer: Self-pay

## 2020-03-14 ENCOUNTER — Encounter: Payer: Self-pay | Admitting: Pulmonary Disease

## 2020-03-14 ENCOUNTER — Ambulatory Visit (INDEPENDENT_AMBULATORY_CARE_PROVIDER_SITE_OTHER): Payer: Medicare Other | Admitting: Pulmonary Disease

## 2020-03-14 VITALS — BP 118/72 | HR 109 | Temp 97.8°F | Ht 72.0 in | Wt 155.0 lb

## 2020-03-14 DIAGNOSIS — I2782 Chronic pulmonary embolism: Secondary | ICD-10-CM | POA: Diagnosis not present

## 2020-03-14 DIAGNOSIS — I872 Venous insufficiency (chronic) (peripheral): Secondary | ICD-10-CM

## 2020-03-14 DIAGNOSIS — C3431 Malignant neoplasm of lower lobe, right bronchus or lung: Secondary | ICD-10-CM | POA: Insufficient documentation

## 2020-03-14 DIAGNOSIS — J449 Chronic obstructive pulmonary disease, unspecified: Secondary | ICD-10-CM

## 2020-03-14 DIAGNOSIS — K922 Gastrointestinal hemorrhage, unspecified: Secondary | ICD-10-CM

## 2020-03-14 DIAGNOSIS — J9611 Chronic respiratory failure with hypoxia: Secondary | ICD-10-CM

## 2020-03-14 DIAGNOSIS — R918 Other nonspecific abnormal finding of lung field: Secondary | ICD-10-CM | POA: Diagnosis not present

## 2020-03-14 NOTE — Assessment & Plan Note (Signed)
Plan: Continue follow-up with a new vascular

## 2020-03-14 NOTE — Assessment & Plan Note (Signed)
Stable today  Plan: Continue forward with establishing with gastroenterology in the outpatient setting next month Continue high-dose PPI Continue monitor for bleeding Continue low-dose Eliquis

## 2020-03-14 NOTE — Assessment & Plan Note (Signed)
Plan: Continue Symbicort 160 Continue Spiriva Respimat 2.5 Continue daily prednisone of 10 mg Continue to work with home health and physical therapy Follow-up in 6 to 8 weeks with [name]

## 2020-03-14 NOTE — Patient Instructions (Addendum)
You were seen today by Lauraine Rinne, NP  for:   1. 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   If you notice these symptoms, please call the office to be seen.    2. Chronic respiratory failure with hypoxia (HCC)  Continue oxygen therapy as prescribed  >>>maintain oxygen saturations greater than 88 percent  >>>if unable to maintain oxygen saturations please contact the office  >>>do not smoke with oxygen  >>>can use nasal saline gel or nasal saline rinses to moisturize nose if oxygen causes dryness  3. Abnormal findings on diagnostic imaging of lung  - CT Chest Wo Contrast; Future  We will order a repeat CT of your chest to be completed around 10 weeks after your most recent CT on 02/16/2020  We will get you scheduled for a follow-up with [name] after completing a CT  4. Other chronic pulmonary embolism, unspecified whether acute cor pulmonale present (HCC)  Continue low-dose Eliquis  5. Acute GI bleeding  Establish care with gastroenterology in the outpatient setting next month as scheduled  Continue forward with PCP follow-up as well as repeat labs   We recommend today:  Orders Placed This Encounter  Procedures   CT Chest Wo Contrast    Standing Status:   Future    Standing Expiration Date:   03/14/2021    Scheduling Instructions:     10 weeks from 02/16/20    Order Specific Question:   Preferred imaging location?    Answer:   [address]    Order  Specific Question:   Radiology Contrast Protocol - do NOT remove file path    Answer:   [email or web address]   Orders Placed This Encounter  Procedures   CT Chest Wo Contrast   No orders of the defined types were placed in this encounter.   Follow Up:    Return in about 7 weeks (around 05/02/2020), or if symptoms worsen or fail to improve, for Follow up with Dr. Purnell Shoemaker, After Chest CT.   Notification of test results are managed in the following manner: If there are  any recommendations or changes to the  plan of care discussed in office today,  we will contact you and let you know what they are. If you do not hear from [place], then your results are normal and you can view them through your  MyChart account , or a letter will be sent to you. Thank you again for trusting [place] with your care  - Thank you, [facility]    It is flu season:   >>> Best ways to protect herself from the flu: Receive the yearly flu vaccine, practice good hand hygiene washing with soap and also using hand sanitizer when available, eat a nutritious meals, get adequate rest, hydrate appropriately       Please contact the office if your symptoms worsen or you have concerns that you are not improving.  Thank you for choosing [facility] for your healthcare, and for allowing [place] to partner with you on your healthcare journey. I am thankful to be able to provide care to you today.   [name] FNP-C

## 2020-03-14 NOTE — Assessment & Plan Note (Signed)
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 in 6 to 8 weeks after CT is obtained

## 2020-03-14 NOTE — Progress Notes (Signed)
_0  ID: Francisco [name], male    DOB: 09/05/1945, 74 y.o.   MRN: [id number]  Chief Complaint  Patient presents with  . Follow-up    Stage 4 COPD    Referring provider: [name], MD  HPI:  74 year old male former smoker followed in our office for COPD  PMH: Hypertension, osteoporosis, chronic PE Smoker/ Smoking History: Former smoker.  Quit smoking in 2019.  104-pack-year smoking history. Maintenance: Symbicort 160, Spiriva Resp 2.5, 10 mg prednisone daily, daliresp, Azithro MWF  Pt of: [name]  03/14/2020  - Visit   74 year old male former smoker followed in our office for COPD.  Patient completing follow-up with our office today after recent hospitalization on 02/18/2020.  Patient with symptomatic anemia.  Patient was discharged on 02/25/2020.  Recommendations from that discharge visit are listed below:  Hospital Course:  Acute GI bleed/Symptomatic anemia -Patient presented with a hemoglobin of 5 which started approximately 3 weeks prior to admission with melena -He was on Xarelto and aspirin prior to admission -Patient was transfused 2 units PRBC -Hemoglobin today 7.7  -Gastroenterology consulted and appreciated -Status post EGD which showed single angioectasia in the duodenum sweep was noted. Treated with argon plasma coagulation (APC). Clips (MR conditional) was placed. - Duodenitis with 3 areas of active oozing. Treated with argon plasma coagulation (APC). Clips (MR conditional) were placed to those regions. -GI recommended PPI BID x 2 months, then daily thereafter -Per gastroenterology, can restart anticoagulation within 72 hours and later with aspirin while monitoring H&H -Patient will need to follow-up with gastroenterology as an outpatient -Continue to monitor CBC for additional day given the start of Eliquis -given deconditioning, transfused an additional unit prior to discharge -repeat CBC in one week  Lactic acidosis -Suspect secondary to  anemia -Has resolved  Chronic respiratory failure with hypoxia/stage IV severe COPD -Patient uses 3 to 4 L of oxygen at home -Continue supplemental oxygen to maintain saturations of 88 to 92% -Continue inhalers and nebulizer treatments  History of PE -As above, patient was noted to be on Xarelto and aspirin prior to admission -On 8/18, Discussed with [name], patient's pulmonologist-will start patient on Eliquis -given that PE was in 2019, will reduce dose of Elqius (in the setting of recent GI Bleed)  Peripheral arterial disease -Advance as you significant hyperemia of the distal bilateral lower extremities -Additionally patient with a chronic appearance of multiple toes on left foot -Was seen by vascular 2 days prior and is noted to be stable -As above we will start Eliquis. Will hold off on restarting aspirin for now -follow up with GI or vascular surgery prior to starting aspirin   Lung infiltrate on CT -Patient has followed with an infectious disease in the past, [name] for frequent bouts of bronchitis. Last visit was in mid July 2021 at that point antibiotics were not indicated -Patient does follow with [name], pulmonology. -Does have chronic cough which is productive however has not had any changes  Hypokalemia/hypophosphatemia -Dose of Kphos given today -Repeat BMP and phos level in one week  Patient reporting to our office today that things have been going better since last being discharged from the hospital.  He has had repeat blood work on 03/10/2020 that showed hemoglobin of 11.6.  Patient reports that overall has been breathing better since getting out of the hospital.  He is establishing with gastroenterology in the outpatient setting next month.  Patient and spouse both do have questions regarding CT  results from 02/16/2020.  We will discuss and evaluate this today.  Patient is currently working with home health and physical therapy to increase  strength.  This is being managed by primary care.  Primary care is also coordinating outpatient lab work for monitoring hemoglobin.  Questionaires / Pulmonary Flowsheets:   ACT:  No flowsheet data found.  MMRC: mMRC Dyspnea Scale mMRC Score  09/17/2019 2  09/06/2019 2    Epworth:  No flowsheet data found.  Tests:   04/15/2019 - EKG - QTC - 424  04/07/2018-CTA- nonocclusive PE centrally involving branches of the right middle lobe and right lower lobe, no evidence of right heart strain  11/04/2017-echocardiogram-LV ejection fraction of 65 to 47%, grade 1 diastolic dysfunction  03/05/5620-HYQMVHQIO function test- FVC 4.59 (92% predicted), postbronchodilator ratio 29, postbronchodilator FEV1 1.41 (40% predicted), DLCO 72  11/27/2018-chest x-ray- no active cardiopulmonary disease, COPD  04/15/2019-chest x-ray-pulmonary hyperinflation emphysema without acute abnormality of the lungs  07/07/2019-chest x-ray-no radiographic evidence of acute cardiopulmonary disease, chronic peribronchial cuffing and emphysematous changes compatible with COPD, extensive scarring in the apices of the lungs bilaterally, right greater than left, aortic arthrosclerosis  11/17/2019-Resp Sputum - Pseudomonas Aeruginosa  >>> Pansensitive  FENO:  No results found for: NITRICOXIDE  PFT: No flowsheet data found.  WALK:  SIX MIN WALK 09/17/2019 06/06/2015 06/28/2014 09/30/2011  Supplimental Oxygen during Test? (L/min) Yes No No No  O2 Flow Rate 3 - - -  Type Continuous - - -  Tech Comments: Patient was able to walk 1 lap while pushing the wheelchair. Denied any SOB during the walk. He did have some leg after the walk. O2 increased to 4L after walk. - - test completed without difficulty/mmr    Imaging: CT CHEST WO CONTRAST  Result Date: 02/16/2020 CLINICAL DATA:  COPD with pneumonia. EXAM: CT CHEST WITHOUT CONTRAST TECHNIQUE: Multidetector CT imaging of the chest was performed following the standard protocol  without IV contrast. COMPARISON:  Insert CT angio chest 12/15/2019 FINDINGS: Cardiovascular: The heart size is normal. No substantial pericardial effusion. Coronary artery calcification is evident. Atherosclerotic calcification is noted in the wall of the thoracic aorta. Ascending thoracic aorta measures 3.9 cm diameter. Mediastinum/Nodes: No mediastinal lymphadenopathy. No evidence for gross hilar lymphadenopathy although assessment is limited by the lack of intravenous contrast on today's study. The esophagus has normal imaging features. There is no axillary lymphadenopathy. Lungs/Pleura: Centrilobular and paraseptal emphysema evident. Architectural distortion consolidative opacity in the right apex is probably related to scarring. Appearance is similar to older CT of 04/07/2018 although consolidative opacity seen on 30/4/3 today is more confluent than on the study from 2 years ago. Bronchiectasis with new patchy airspace consolidation noted in the inferior right upper lobe anteriorly. There is some patchy airspace disease in the posterior right middle lobe, new in the interval. Interstitial and ground-glass opacity in the right lower lobe is associated with new consolidative airspace disease towards the bas small right pleural effusion is new in the interval. 8 mm posterior left lower lobe nodule on 01/30 3/3 is new since prior. E. Upper Abdomen: Unremarkable. Musculoskeletal: Stable midthoracic compression fractures. No worrisome lytic or sclerotic osseous abnormality. IMPRESSION: 1. Interval development of patchy airspace consolidation in the inferior right upper lobe with new patchy airspace disease in the posterior right middle lobe and right lower lobe. Imaging features are compatible with multifocal pneumonia. Multifocal neoplasm (adenocarcinoma) considered less likely but close follow-up recommended. 2. New small right pleural effusion. 3. 8 mm posterior left lower lobe  pulmonary nodule, new since prior.  This may be infectious/inflammatory, but follow-up CT in 3 months recommended to ensure resolution. 4. Architectural distortion consolidative opacity in the right apex is similar to older CT of 04/07/2018 although opacity inferiorly in this region is more confluent than on the study from 2 years ago. This is likely related to evolution of scarring, but continued attention on follow-up recommended. 5. Aortic Atherosclerosis (ICD10-I70.0) and Emphysema (ICD10-J43.9). Electronically Signed   By: Misty Stanley M.D.   On: 02/16/2020 12:58   DG Chest Port 1 View  Result Date: 02/18/2020 CLINICAL DATA:  74 year old male with shortness of breath. EXAM: PORTABLE CHEST 1 VIEW COMPARISON:  Chest radiograph dated 01/27/2020. FINDINGS: There is a small right pleural effusion. Diffuse right lung interstitial coarsening and opacities most consistent with infiltrate and relative the unchanged. Areas of subpleural scarring noted in the right apex and right lung base with overall decreased volume loss in the right lung. There is background of emphysema. No pneumothorax. The cardiac silhouette is within limits. No acute osseous pathology. IMPRESSION: Small right pleural effusion and right lung infiltrate similar to prior radiograph. Electronically Signed   By: Anner Crete M.D.   On: 02/18/2020 17:07    Lab Results:  CBC    Component Value Date/Time   WBC 10.5 03/10/2020 1107   RBC 4.17 (L) 03/10/2020 1107   HGB 11.6 (L) 03/10/2020 1107   HCT 36.6 (L) 03/10/2020 1107   PLT 255 03/10/2020 1107   MCV 87.8 03/10/2020 1107   MCH 27.8 03/10/2020 1107   MCHC 31.7 (L) 03/10/2020 1107   RDW 14.2 03/10/2020 1107   LYMPHSABS 567 (L) 03/10/2020 1107   MONOABS 1.0 02/18/2020 1645   EOSABS 53 03/10/2020 1107   BASOSABS 42 03/10/2020 1107    BMET    Component Value Date/Time   NA 139 03/10/2020 1107   NA 140 03/02/2020 0000   K 4.9 03/10/2020 1107   CL 100 03/10/2020 1107   CO2 27 03/10/2020 1107   GLUCOSE 99  03/10/2020 1107   BUN 17 03/10/2020 1107   BUN 14 03/02/2020 0000   CREATININE 1.05 03/10/2020 1107   CALCIUM 9.3 03/10/2020 1107   GFRNONAA 71 03/10/2020 1107   GFRAA 82 03/10/2020 1107    BNP    Component Value Date/Time   BNP 186.4 (H) 12/15/2019 1136    ProBNP    Component Value Date/Time   PROBNP 50.0 04/03/2018 1148    Specialty Problems      Pulmonary Problems   Pulmonary nodule    Ct chest 2013:  Multiple bilat pulmonary nodules. CT chest 02/2012:  Significant decrease in size of RUL nodule, LLL nodule unchanged.  Needs f/u 02/2013.  CT chest 05/2013:  No nodules seen.       Chronic rhinitis    F/u [facility]/ ENT - Sinus  CT 08/01/09 Bilateral maxillary sinusitis with some mucosal thickening in the  sphenoid and frontal sinuses as well.       Chronic respiratory failure with hypoxia (HCC)   Stage 4 very severe COPD by GOLD classification (HCC)   Lung infiltrate on CT      Allergies  Allergen Reactions  . Tape Other (See Comments)    SKIN IS VERY THIN, TEARS SKIN; CAN ONLY USE COBAN WRAPS DUE TO CONDITION OF SKIN!!  . Ciprofloxacin Nausea Only    Sick on stomach, weak/tired  . Levaquin [Levofloxacin] Other (See Comments)    hallucinations  . Lipitor [Atorvastatin] Rash  Immunization History  Administered Date(s) Administered  . Fluad Quad(high Dose 65+) 03/16/2019, 03/10/2020  . Influenza Split 04/24/2012  . Influenza Whole 05/08/2009, 04/08/2011  . Influenza, High Dose Seasonal PF 05/01/2017, 03/31/2018  . Influenza,inj,Quad PF,6+ Mos 04/27/2013, 04/15/2014, 04/24/2015  . Influenza,inj,quad, With Preservative 05/01/2018  . Influenza-Unspecified 05/13/2016  . PFIZER SARS-COV-2 Vaccination 07/30/2019, 08/20/2019  . Pneumococcal Conjugate-13 02/25/2014  . Pneumococcal Polysaccharide-23 08/15/2011, 12/19/2016  . Pneumococcal-Unspecified 03/08/2017  . Td 02/21/2010  . Tdap 11/05/2017  . Zoster 01/29/2012    Past Medical History:  Diagnosis  Date  . Chronic back pain    "mid and lower" (04/07/2018)  . Chronic rhinitis    -Sinus Ct 08/01/2009 >> Bilateral maxillary sinusitis with some mucosal thickeningin the sphenoid and frontal sinuses as well with air fluid levels present -chronic rhinitis flyer Aug 04, 2009  . Compressed spine fracture ([place])   . COPD (chronic obstructive pulmonary disease) [facility])    PFT's rec Jul 17, 2009  . Emphysema lung ([place])   . On home oxygen therapy    "3L; 24/7" (04/07/2018)  . Onychomycosis    Dr. Judi Cong  . Orthostatic hypotension    "since 10/2017" (04/07/2018)  . PAD (peripheral artery disease) ([place])   . Pneumonia    "twice in 1 year" (04/07/2018)  . Pulmonary embolism ([place]) 04/07/2018  . Skin cancer    "lips, face, ears, arms" (04/07/2018)  . Vertigo    "since ~ 02/2018" (04/07/2018)    Tobacco History: Social History   Tobacco Use  Smoking Status Former Smoker  . Packs/day: 2.00  . Years: 52.00  . Pack years: 104.00  . Types: Pipe, Cigarettes  . Start date: 10/06/1965  . Quit date: 10/06/2017  . Years since quitting: 2.4  Smokeless Tobacco Never Used  Tobacco Comment   04/07/2018 "smoked cigarettes years ago; stopped ~ 20 yr ago; stopped smoking pipe in 09/2017"   Counseling given: Yes Comment: 04/07/2018 "smoked cigarettes years ago; stopped ~ 75 yr ago; stopped smoking pipe in 09/2017"   Continue to not smoke  Outpatient Encounter Medications as of 03/14/2020  Medication Sig  . acetaminophen (TYLENOL) 500 MG tablet Take 1,000 mg by mouth every 6 (six) hours as needed for moderate pain.   [name] Kitchen albuterol (VENTOLIN HFA) 108 (90 Base) MCG/ACT inhaler USE 2 PUFFS EVERY 6 HOURS  AS NEEDED FOR WHEEZING  . apixaban (ELIQUIS) 2.5 MG TABS tablet Take 1 tablet (2.5 mg total) by mouth 2 (two) times daily.  [name] Kitchen atorvastatin (LIPITOR) 40 MG tablet Take 1 tablet (40 mg total) by mouth daily.  [name] Kitchen azithromycin (ZITHROMAX) 250 MG tablet Take 1 tablet (250 mg total) by mouth every Monday, Wednesday, and Friday.    . budesonide-formoterol (SYMBICORT) 160-4.5 MCG/ACT inhaler Inhale 2 puffs into the lungs 2 (two) times daily.  . Calcium Carbonate-Vitamin D (CALCIUM 600+D PO) Take 2 tablets by mouth daily.  . cycloSPORINE (RESTASIS) 0.05 % ophthalmic emulsion Place 1 drop into both eyes 2 (two) times daily as needed (dry eyes).   . fluticasone (FLONASE) 50 MCG/ACT nasal spray Place 2 sprays into both nostrils daily.  . folic acid (FOLVITE) 1 MG tablet Take 1 tablet (1 mg total) by mouth daily.  . furosemide (LASIX) 20 MG tablet TAKE 1 TABLET BY MOUTH  DAILY (Patient taking differently: Take 20 mg by mouth daily. )  . Multiple Vitamin (MULTIVITAMIN WITH MINERALS) TABS tablet Take 1 tablet by mouth daily.  . OXYGEN Inhale 4-5 L into the lungs continuous.   [name] Kitchen  pantoprazole (PROTONIX) 40 MG tablet Take 1 tablet (40 mg total) by mouth 2 (two) times daily.  . predniSONE (DELTASONE) 10 MG tablet Take 1 tablet (10 mg total) by mouth daily with breakfast.  . Respiratory Therapy Supplies (FLUTTER) DEVI 10 times Twice a day and prn as needed, may increase if feeling worse  . roflumilast (DALIRESP) 500 MCG TABS tablet Take 1 tablet (500 mcg total) by mouth daily.  . rosuvastatin (CRESTOR) 10 MG tablet Take 1 tablet (10 mg total) by mouth daily.  . sodium chloride HYPERTONIC 3 % nebulizer solution Take by nebulization daily. (Patient taking differently: Take 4 mLs by nebulization daily as needed (to loosen phlegm). )  . thiamine 100 MG tablet Take 1 tablet (100 mg total) by mouth daily.  . Tiotropium Bromide Monohydrate (SPIRIVA RESPIMAT) 2.5 MCG/ACT AERS USE 2 INHALATIONS BY MOUTH  ONCE DAILY  . traMADol (ULTRAM) 50 MG tablet Take 1 tablet (50 mg total) by mouth every 12 (twelve) hours as needed for moderate pain or severe pain. (Patient taking differently: Take 50 mg by mouth See admin instructions. Take one tablet (50 mg) by mouth daily at bedtime, may also take one tablet (50 mg) 12 hours later if still needed for pain)   . [DISCONTINUED] Tiotropium Bromide Monohydrate (SPIRIVA RESPIMAT) 2.5 MCG/ACT AERS Inhale 2 puffs into the lungs daily.   No facility-administered encounter medications on file as of 03/14/2020.     Review of Systems  Review of Systems  Constitutional: Positive for fatigue. Negative for activity change, chills, fever and unexpected weight change.  HENT: Positive for postnasal drip. Negative for rhinorrhea, sinus pressure, sinus pain and sore throat.   Eyes: Negative.   Respiratory: Positive for shortness of breath. Negative for cough and wheezing.   Cardiovascular: Negative for chest pain and palpitations.  Gastrointestinal: Negative for constipation, diarrhea, nausea and vomiting.  Endocrine: Negative.   Genitourinary: Negative.   Musculoskeletal: Negative.   Skin: Negative.   Neurological: Positive for weakness. Negative for dizziness and headaches.  Psychiatric/Behavioral: Negative.  Negative for dysphoric mood. The patient is not nervous/anxious.   All other systems reviewed and are negative.    Physical Exam  BP 118/72 (BP Location: Left Arm, Cuff Size: Normal)   Pulse (!) 109   Temp 97.8 F (36.6 C) (Oral)   Ht 6' (1.829 m)   Wt 155 lb (70.3 kg)   SpO2 99%   BMI 21.02 kg/m   Wt Readings from Last 5 Encounters:  03/14/20 155 lb (70.3 kg)  03/10/20 153 lb (69.4 kg)  02/25/20 153 lb 3.2 oz (69.5 kg)  02/17/20 160 lb (72.6 kg)  01/24/20 162 lb 12.8 oz (73.8 kg)    BMI Readings from Last 5 Encounters:  03/14/20 21.02 kg/m  03/10/20 20.75 kg/m  02/25/20 20.78 kg/m  02/17/20 21.70 kg/m  01/24/20 22.08 kg/m     Physical Exam Vitals and nursing note reviewed.  Constitutional:      General: He is not in acute distress.    Appearance: Normal appearance. He is normal weight.  HENT:     Head: Normocephalic and atraumatic.     Right Ear: Hearing, tympanic membrane, ear canal and external ear normal.     Left Ear: Hearing, tympanic membrane, ear canal and  external ear normal.     Nose: Nose normal. No mucosal edema or rhinorrhea.     Right Turbinates: Not enlarged.     Left Turbinates: Not enlarged.     Mouth/Throat:  Mouth: Mucous membranes are dry.     Pharynx: Oropharynx is clear. No oropharyngeal exudate.  Eyes:     Pupils: Pupils are equal, round, and reactive to light.  Cardiovascular:     Rate and Rhythm: Normal rate and regular rhythm.     Pulses: Normal pulses.     Heart sounds: Normal heart sounds. No murmur heard.   Pulmonary:     Effort: Pulmonary effort is normal.     Breath sounds: Normal breath sounds. No decreased breath sounds, wheezing or rales.  Musculoskeletal:     Cervical back: Normal range of motion.     Right lower leg: 3+ Pitting Edema present.     Left lower leg: 3+ Pitting Edema present.  Lymphadenopathy:     Cervical: No cervical adenopathy.  Skin:    General: Skin is warm and dry.     Capillary Refill: Capillary refill takes less than 2 seconds.     Findings: No erythema or rash.  Neurological:     General: No focal deficit present.     Mental Status: He is alert and oriented to person, place, and time.     Motor: Weakness present.     Coordination: Coordination normal.     Gait: Gait abnormal.     Comments: In wheelchair  Psychiatric:        Mood and Affect: Mood normal.        Behavior: Behavior normal. Behavior is cooperative.        Thought Content: Thought content normal.        Judgment: Judgment normal.       Assessment & Plan:   Chronic pulmonary embolism (HCC) Plan: Continue low-dose Eliquis  Stage 4 very severe COPD by GOLD classification ([place]) Plan: Continue Symbicort 160 Continue Spiriva Respimat 2.5 Continue daily prednisone of 10 mg Continue to work with home health and physical therapy Follow-up in 6 to 8 weeks with [name]  Chronic respiratory failure with hypoxia ([place]) Plan: Continue oxygen therapy  Abnormal findings on diagnostic imaging of  lung Recent CT in August/2021 showing concerns for multifocal pneumonia as well as new a millimeter pulmonary nodule Radiology recommending close CT imaging follow-up Patient and spouse concerned regarding nodule as well as mention of adenocarcinoma by radiology  Plan: We will repeat CT of chest in 10 to 12 weeks from last CT in August/2021 We will have follow-up with [name] in 6 to 8 weeks after CT is obtained  Venous stasis dermatitis of both lower extremities Plan: Continue follow-up with a new vascular  Acute GI bleeding Stable today  Plan: Continue forward with establishing with gastroenterology in the outpatient setting next month Continue high-dose PPI Continue monitor for bleeding Continue low-dose Eliquis    Return in about 7 weeks (around 05/02/2020), or if symptoms worsen or fail to improve, for Follow up with Dr. Purnell Shoemaker, After Chest CT.   Lauraine Rinne, NP 03/14/2020   This appointment required 34 minutes of patient care (this includes precharting, chart review, review of results, face-to-face care, etc.).

## 2020-03-14 NOTE — Assessment & Plan Note (Signed)
Plan: Continue oxygen therapy 

## 2020-03-14 NOTE — Assessment & Plan Note (Signed)
Plan: Continue low-dose Eliquis

## 2020-03-22 ENCOUNTER — Encounter: Payer: Self-pay | Admitting: Family Medicine

## 2020-03-24 ENCOUNTER — Telehealth: Payer: Self-pay | Admitting: Family Medicine

## 2020-03-24 NOTE — Telephone Encounter (Signed)
Amy from [facility] is needing verbal orders for one more nurse visit and updated lab orders. Please call back at [phone].

## 2020-03-24 NOTE — Telephone Encounter (Signed)
Spoke with Amy and she stated that she will see the patient next and week to his lab order. No other question or concerns at this time.

## 2020-03-29 ENCOUNTER — Encounter: Payer: Self-pay | Admitting: Vascular Surgery

## 2020-03-31 ENCOUNTER — Encounter: Payer: Self-pay | Admitting: Family Medicine

## 2020-04-03 ENCOUNTER — Telehealth: Payer: Self-pay

## 2020-04-03 NOTE — Telephone Encounter (Signed)
err

## 2020-04-04 ENCOUNTER — Other Ambulatory Visit: Payer: Medicare Other | Admitting: Hospice

## 2020-04-04 ENCOUNTER — Other Ambulatory Visit: Payer: Self-pay | Admitting: Internal Medicine

## 2020-04-04 ENCOUNTER — Other Ambulatory Visit: Payer: Self-pay

## 2020-04-04 DIAGNOSIS — J449 Chronic obstructive pulmonary disease, unspecified: Secondary | ICD-10-CM

## 2020-04-04 DIAGNOSIS — Z515 Encounter for palliative care: Secondary | ICD-10-CM

## 2020-04-04 MED ORDER — AZITHROMYCIN 250 MG PO TABS: 250.0000 mg | ORAL_TABLET | ORAL | 1 refills | Status: DC

## 2020-04-04 NOTE — Progress Notes (Signed)
[facility] Consult Note Telephone: [phone]  Fax: [phone]  PATIENT NAME: Francisco [name] DOB: May 04, 1946 MRN: [id number]  PRIMARY CARE PROVIDER:   [name], MD [name], MD [place],  [place] [address]  [place] NP  [place] PARTY:Self [phone] [address] [place] [address] Contact: Katy Apo ( spouse) [phone] c  TELEHEALTH VISIT STATEMENT Due to the COVID-19 crisis, this visit was done via telephone from my office. It was initiated and consented to by this patient and/or family.    RECOMMENDATIONS/PLAN:  Advance Care Planning/Goals of Care: Visit consisted of building trust and discussions on Palliative Medicine as specialized medical care for people living with serious illness, aimed at facilitating better quality of life through symptoms relief, assisting with advance care plan and establishing goals of care.    CODE STATUS: Reviewed CODE STATUS.  Patient is a DO NOT RESUSCITATE.    GOALS of care:Goals of care include to maximize quality of life and symptom management.  MOST form selections include do not attempt resuscitation,limited additional intervention,antibiotics if indicated,IV fluids if indicated,no feeding tube.  Patient has signed MOST form and DNR at home. Palliative care team will continue to support patient, patient's family, and medical team.  Follow XN:[id number] care will continue to follow patient for goals of care clarification and symptom management. Follow up in 6 weeks  Symptom management:In the past 2 months patient has been hospitalized for acute respiratory distress/pleural effusion and symptomatic anemia.  Admission for anemia was 8/13 -  02/25/2020.  Chart review in epic indicates that his hemoglobin was as low as 5.0; patient was also having GI bleed.  Patient switched from Xarelto to low-dose Eliquis for PE.   After treatments/blood transfusions patient's hemoglobin is now greater than 10, per Spouse.    Patient reports greater energy and breathing better than before; he is now able to walk around the house without his rolling walker.  Use of rolling walker encouraged for stability and support.  He continues to be compliant with his oxygen supplementation, breathing treatments and other medications as ordered.  Patient is followed by [name] for stage IV severe  COPD requiring antibiotics 3 days a week and occasional steroid burst.  Today patient denied coughing/shortness of breath; he endorsed breathing a lot easier and feeling way better than before.   No shortness of breath during discussion with patient.  NP encouraged ongoing care.  Family/caregiver/community supports: Patient lives at home with his wife who is very involved in his care.  [facility] RN/PT from [facility] a week.  I spent46providing this consultation; time includes time with patient/family, chart review and documentation. More than 50% of the time in this consultation was spent on coordinating communication  HISTORY OF PRESENT ILLNESS:Francisco W Neville IIis a 74 y.o.year oldmalewith multiple medical problems including stage 4 COPD, PAD chronic pulmonary embolism. Palliative Care was asked to help address goals of care.   CODE STATUS:DNR  PPS:50%.   HOSPICE ELIGIBILITY/DIAGNOSIS: TBD  PAST MEDICAL HISTORY:  Past Medical History:  Diagnosis Date   Chronic back pain    "mid and lower" (04/07/2018)   Chronic rhinitis    -Sinus Ct 08/01/2009 >> Bilateral maxillary sinusitis with some mucosal thickeningin the sphenoid and frontal sinuses as well with air fluid levels present -chronic rhinitis flyer Aug 04, 2009   Compressed spine fracture [facility])    COPD (chronic obstructive  pulmonary disease) [facility])    PFT's rec Jul 17, 2009   Emphysema lung [facility])    On home oxygen therapy    "3L; 24/7"  (04/07/2018)   Onychomycosis    Dr. Judi Cong   Orthostatic hypotension    "since 10/2017" (04/07/2018)   PAD (peripheral artery disease) ([place])    Pneumonia    "twice in 1 year" (04/07/2018)   Pulmonary embolism ([place]) 04/07/2018   Skin cancer    "lips, face, ears, arms" (04/07/2018)   Vertigo    "since ~ 02/2018" (04/07/2018)    SOCIAL HX:  Social History   Tobacco Use   Smoking status: Former Smoker    Packs/day: 2.00    Years: 52.00    Pack years: 104.00    Types: Pipe, Cigarettes    Start date: 10/06/1965    Quit date: 10/06/2017    Years since quitting: 2.4   Smokeless tobacco: Never Used   Tobacco comment: 04/07/2018 "smoked cigarettes years ago; stopped ~ 20 yr ago; stopped smoking pipe in 09/2017"  Substance Use Topics   Alcohol use: Yes    Alcohol/week: 28.0 standard drinks    Types: 28 Cans of beer per week    Comment: "04/15/2019 "4 beers/day; probably closer to 28/wk"    ALLERGIES:  Allergies  Allergen Reactions   Tape Other (See Comments)    SKIN IS VERY THIN, TEARS SKIN; CAN ONLY USE COBAN WRAPS DUE TO CONDITION OF SKIN!!   Ciprofloxacin Nausea Only    Sick on stomach, weak/tired   Levaquin [Levofloxacin] Other (See Comments)    hallucinations   Lipitor [Atorvastatin] Rash     PERTINENT MEDICATIONS:  Outpatient Encounter Medications as of 04/04/2020  Medication Sig   acetaminophen (TYLENOL) 500 MG tablet Take 1,000 mg by mouth every 6 (six) hours as needed for moderate pain.    albuterol (VENTOLIN HFA) 108 (90 Base) MCG/ACT inhaler USE 2 PUFFS EVERY 6 HOURS  AS NEEDED FOR WHEEZING   apixaban (ELIQUIS) 2.5 MG TABS tablet Take 1 tablet (2.5 mg total) by mouth 2 (two) times daily.   atorvastatin (LIPITOR) 40 MG tablet Take 1 tablet (40 mg total) by mouth daily.   [START ON 04/05/2020] azithromycin (ZITHROMAX) 250 MG tablet Take 1 tablet (250 mg total) by mouth every Monday, Wednesday, and Friday.   budesonide-formoterol (SYMBICORT) 160-4.5 MCG/ACT  inhaler Inhale 2 puffs into the lungs 2 (two) times daily.   Calcium Carbonate-Vitamin D (CALCIUM 600+D PO) Take 2 tablets by mouth daily.   cycloSPORINE (RESTASIS) 0.05 % ophthalmic emulsion Place 1 drop into both eyes 2 (two) times daily as needed (dry eyes).    fluticasone (FLONASE) 50 MCG/ACT nasal spray Place 2 sprays into both nostrils daily.   folic acid (FOLVITE) 1 MG tablet Take 1 tablet (1 mg total) by mouth daily.   furosemide (LASIX) 20 MG tablet TAKE 1 TABLET BY MOUTH  DAILY (Patient taking differently: Take 20 mg by mouth daily. )   Multiple Vitamin (MULTIVITAMIN WITH MINERALS) TABS tablet Take 1 tablet by mouth daily.   OXYGEN Inhale 4-5 L into the lungs continuous.    pantoprazole (PROTONIX) 40 MG tablet Take 1 tablet (40 mg total) by mouth 2 (two) times daily.   predniSONE (DELTASONE) 10 MG tablet Take 1 tablet (10 mg total) by mouth daily with breakfast.   Respiratory Therapy Supplies (FLUTTER) DEVI 10 times Twice a day and prn as needed, may increase if feeling worse   roflumilast (DALIRESP) 500 MCG TABS tablet Take  1 tablet (500 mcg total) by mouth daily.   rosuvastatin (CRESTOR) 10 MG tablet Take 1 tablet (10 mg total) by mouth daily.   sodium chloride HYPERTONIC 3 % nebulizer solution Take by nebulization daily. (Patient taking differently: Take 4 mLs by nebulization daily as needed (to loosen phlegm). )   thiamine 100 MG tablet Take 1 tablet (100 mg total) by mouth daily.   Tiotropium Bromide Monohydrate (SPIRIVA RESPIMAT) 2.5 MCG/ACT AERS USE 2 INHALATIONS BY MOUTH&nbsp;&nbsp;ONCE DAILY   traMADol (ULTRAM) 50 MG tablet Take 1 tablet (50 mg total) by mouth every 12 (twelve) hours as needed for moderate pain or severe pain. (Patient taking differently: Take 50 mg by mouth See admin instructions. Take one tablet (50 mg) by mouth daily at bedtime, may also take one tablet (50 mg) 12 hours later if still needed for pain)   No facility-administered encounter  medications on file as of 04/04/2020.    Teodoro Spray, NP

## 2020-04-06 ENCOUNTER — Telehealth: Payer: Self-pay

## 2020-04-06 ENCOUNTER — Other Ambulatory Visit: Payer: Self-pay

## 2020-04-06 DIAGNOSIS — D649 Anemia, unspecified: Secondary | ICD-10-CM

## 2020-04-06 DIAGNOSIS — D619 Aplastic anemia, unspecified: Secondary | ICD-10-CM

## 2020-04-06 MED ORDER — ROSUVASTATIN CALCIUM 10 MG PO TABS
10.0000 mg | ORAL_TABLET | Freq: Every day | ORAL | 3 refills | Status: DC
Start: 2020-04-06 — End: 2020-12-20

## 2020-04-06 MED ORDER — TRAMADOL HCL 50 MG PO TABS
50.0000 mg | ORAL_TABLET | Freq: Two times a day (BID) | ORAL | 1 refills | Status: DC | PRN
Start: 2020-04-06 — End: 2021-01-15

## 2020-04-06 NOTE — Telephone Encounter (Signed)
Pt called asking if CMA would give him a call about a medicine. Pt states his pharmacy told him the CMA needs to call for approval of the prescription. Please advise.

## 2020-04-06 NOTE — Telephone Encounter (Signed)
Spoke with the patient and he stated that he would like his prescription for rosuvastatin (CRESTOR) 10 MG tablet and traMADol (ULTRAM) 50 MG tablet sent to [facility].

## 2020-04-06 NOTE — Telephone Encounter (Signed)
I sent these in

## 2020-04-16 ENCOUNTER — Other Ambulatory Visit: Payer: Self-pay | Admitting: Internal Medicine

## 2020-04-20 ENCOUNTER — Ambulatory Visit: Payer: Medicare Other | Admitting: Vascular Surgery

## 2020-04-21 ENCOUNTER — Other Ambulatory Visit: Payer: Self-pay

## 2020-04-21 ENCOUNTER — Other Ambulatory Visit: Payer: Medicare Other

## 2020-04-21 DIAGNOSIS — D649 Anemia, unspecified: Secondary | ICD-10-CM

## 2020-04-21 LAB — CBC
HCT: 34.7 % — ABNORMAL LOW (ref 38.5–50.0)
Hemoglobin: 11 g/dL — ABNORMAL LOW (ref 13.2–17.1)
MCH: 26.1 pg — ABNORMAL LOW (ref 27.0–33.0)
MCHC: 31.7 g/dL — ABNORMAL LOW (ref 32.0–36.0)
MCV: 82.4 fL (ref 80.0–100.0)
MPV: 9 fL (ref 7.5–12.5)
Platelets: 268 10*3/uL (ref 140–400)
RBC: 4.21 10*6/uL (ref 4.20–5.80)
RDW: 13.9 % (ref 11.0–15.0)
WBC: 11.3 10*3/uL — ABNORMAL HIGH (ref 3.8–10.8)

## 2020-04-25 ENCOUNTER — Other Ambulatory Visit: Payer: Self-pay

## 2020-04-25 ENCOUNTER — Ambulatory Visit (INDEPENDENT_AMBULATORY_CARE_PROVIDER_SITE_OTHER)
Admission: RE | Admit: 2020-04-25 | Discharge: 2020-04-25 | Disposition: A | Discharge status: Home / Self Care | Payer: Medicare Other | Source: Ambulatory Visit | Attending: Pulmonary Disease | Admitting: Pulmonary Disease

## 2020-04-25 DIAGNOSIS — R918 Other nonspecific abnormal finding of lung field: Secondary | ICD-10-CM

## 2020-04-25 IMAGING — CT CT CHEST W/O CM
2 of 4 series · 15 of 36 positions shown, 18 images · non-contrast
Comparison: Chest radiograph, [DATE]

CLINICAL DATA: Persistent right pleural effusion

EXAM:
CT CHEST WITHOUT CONTRAST
TECHNIQUE: Multidetector CT imaging of the chest was performed following the
standard protocol without IV contrast.

[Series 2: thorax · axial · 0.75mm/px · z∈[+1054,+1298]mm · 12 of 146 slices shown, 15 images]
[im 12/146  mediastinal]
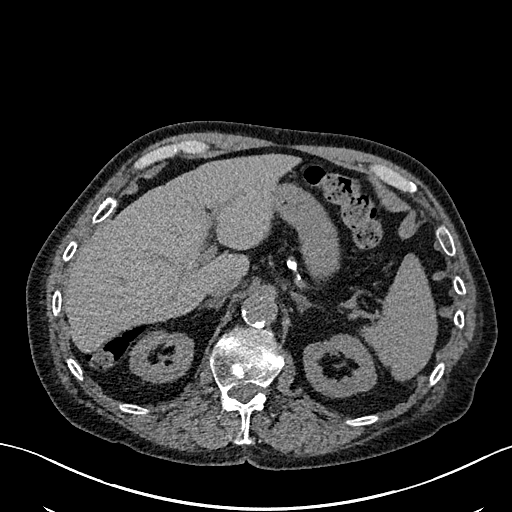
[im 12/146  lung]
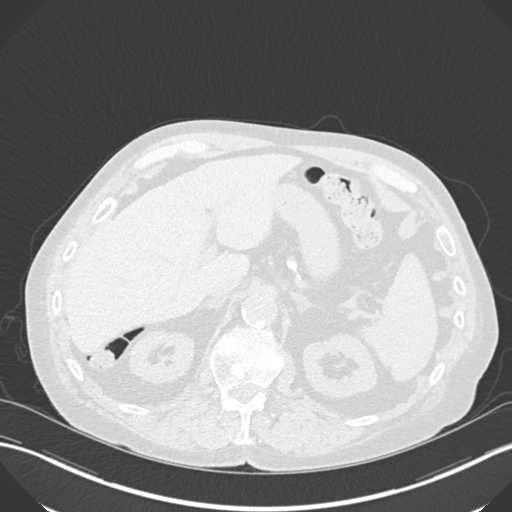
[im 23/146  lung]
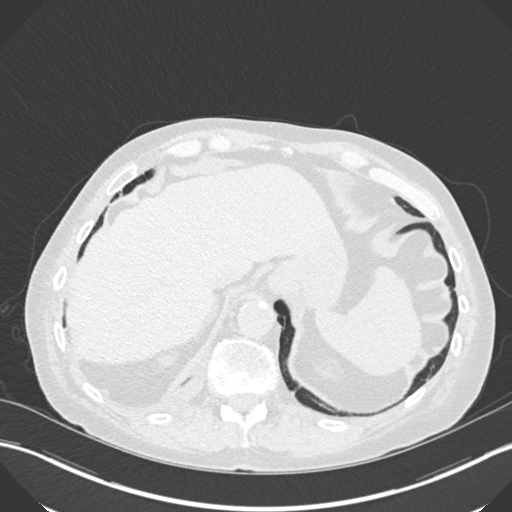
[im 34/146  lung]
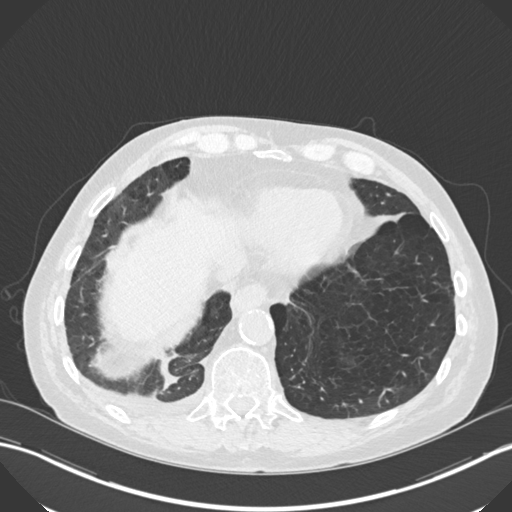
[im 45/146  lung]
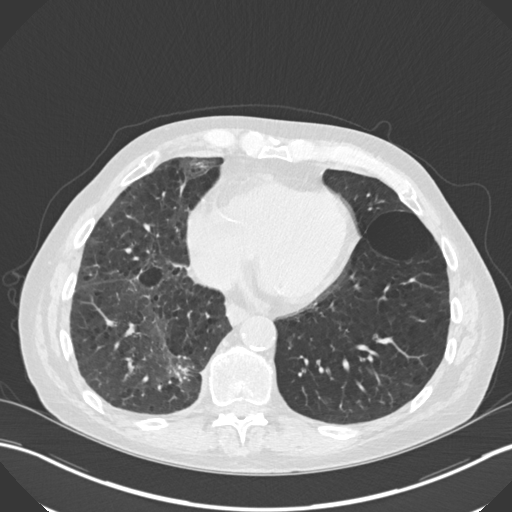
[im 56/146  mediastinal]
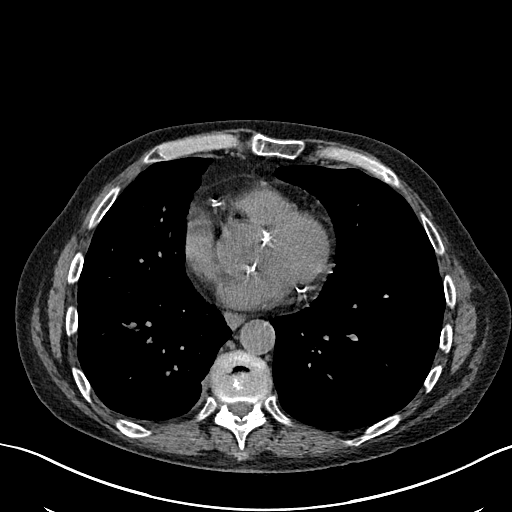
[im 56/146  lung]
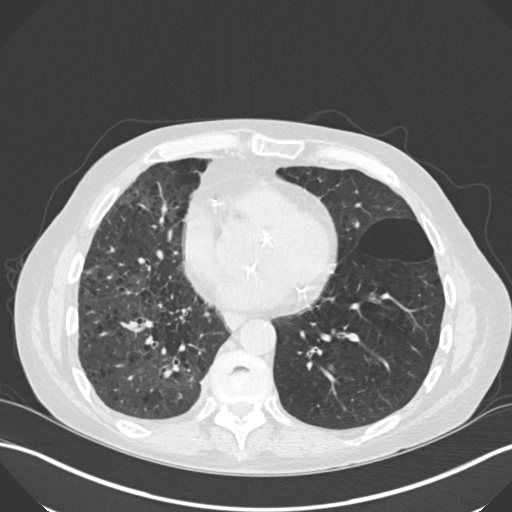
[im 67/146  lung]
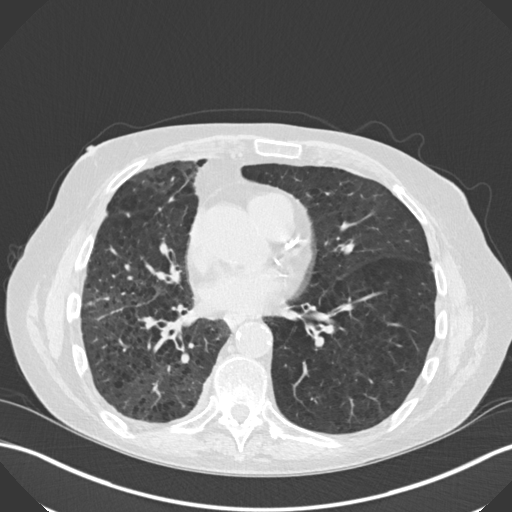
[im 79/146  lung]
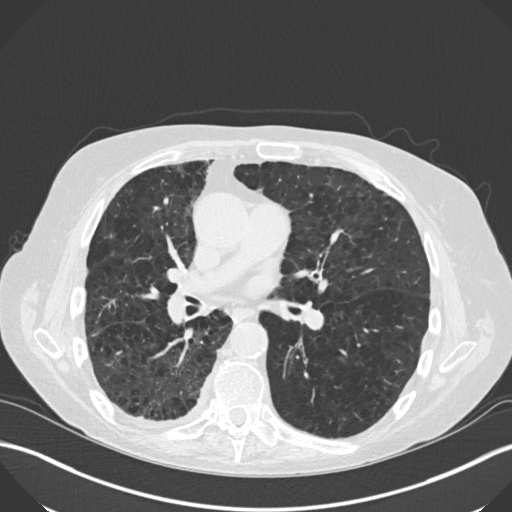
[im 90/146  lung]
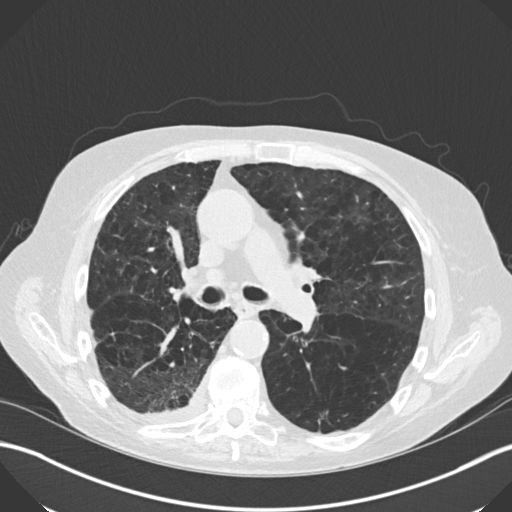
[im 101/146  mediastinal]
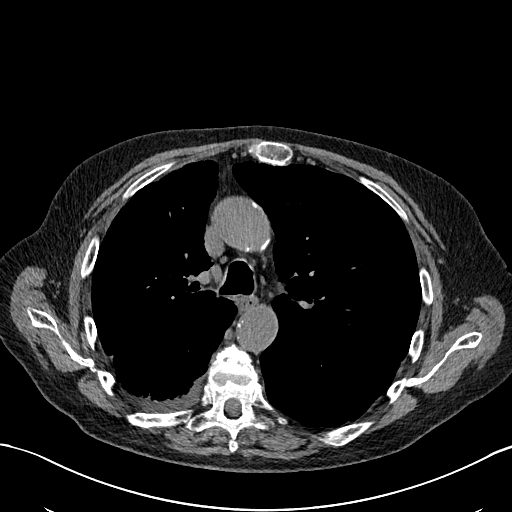
[im 101/146  lung]
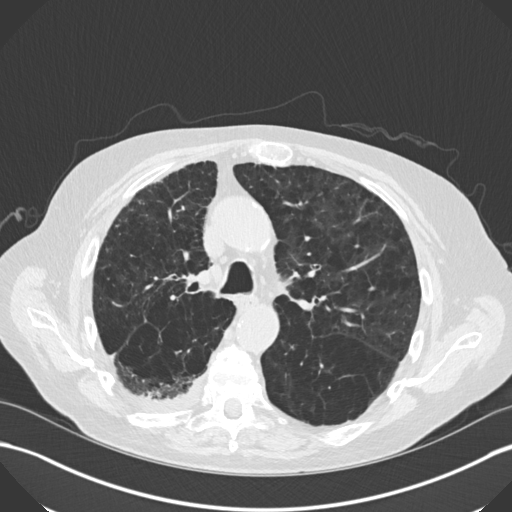
[im 112/146  lung]
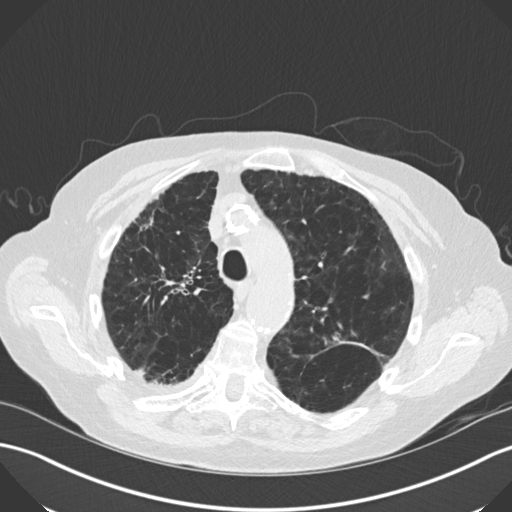
[im 123/146  lung]
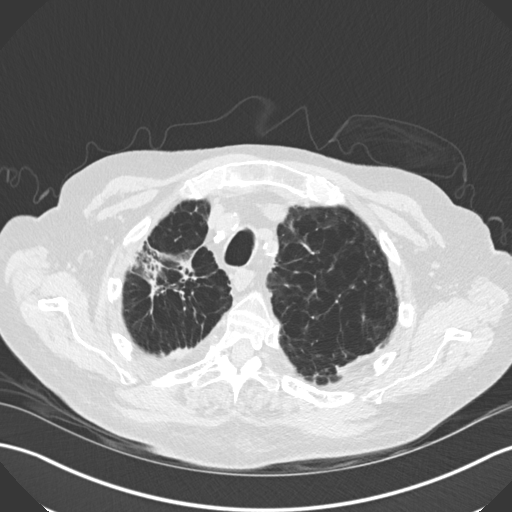
[im 134/146  lung]
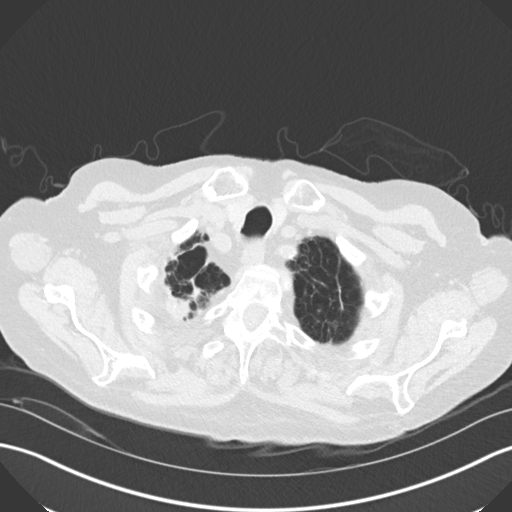

[Series 5: coronal · coronal · 0.59mm/px · 3 of 129 slices shown]
[im 26/129  lung]
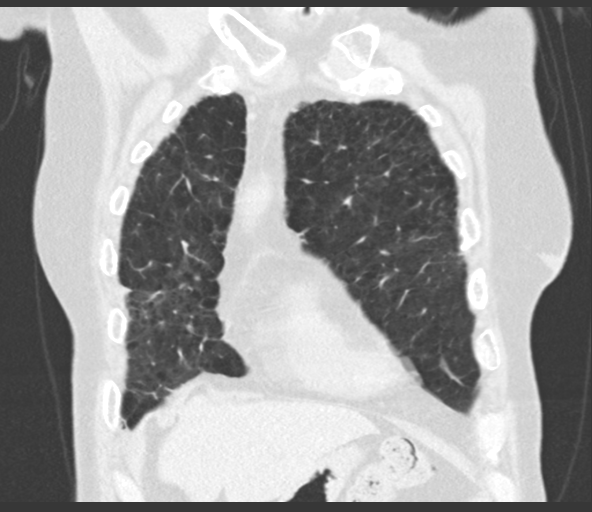
[im 52/129  lung]
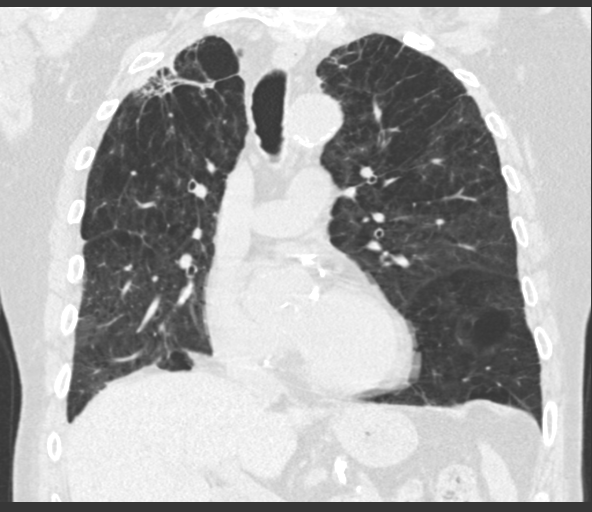
[im 77/129  lung]
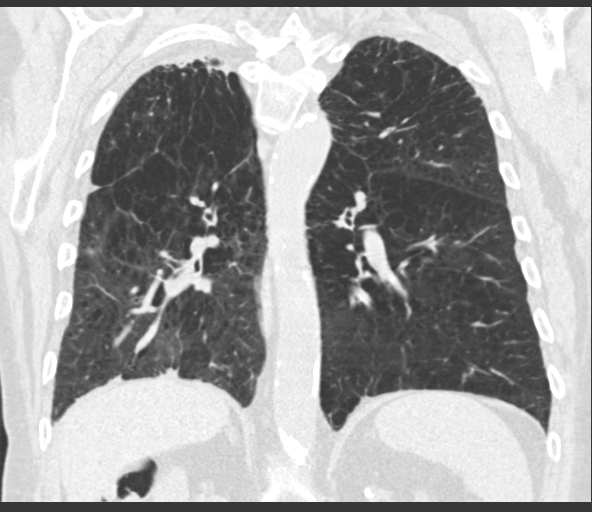

[15 of 36 positions shown; findings below may reference images not displayed]

FINDINGS: Cardiovascular: Aortic atherosclerosis. Normal heart size. Extensive
3 vessel coronary artery calcifications and/or stents. No
pericardial effusion.

Mediastinum/Nodes: No enlarged mediastinal, hilar, or axillary lymph
nodes. Thyroid gland, trachea, and esophagus demonstrate no
significant findings.

Lungs/Pleura: There is a trace right pleural effusion and associated
atelectasis or consolidation, both of which are improved compared to
prior examination. There is severe centrilobular emphysema and
fibrotic scarring of the right greater than left lung apices.
Diffuse bilateral bronchial wall thickening. Frothy debris in the
bilateral mainstem bronchi.

Upper Abdomen: No acute abnormality.

Musculoskeletal: No chest wall mass or suspicious bone lesions
identified. There is a fluid attenuation subcutaneous inclusion cyst
of the midline upper back (series 2, image 18). Unchanged wedge
deformities of T6 through 8.
IMPRESSION: 1. Trace right pleural effusion and associated atelectasis or
consolidation, both of which are improved compared to prior
examination. Findings are generally consistent with resolving
sequelae of prior infection or aspiration.
2. Diffuse bilateral bronchial wall thickening consistent with
nonspecific infectious or inflammatory bronchitis.
3. Frothy debris in the bilateral mainstem bronchi, concerning for
aspiration.
4. Severe centrilobular emphysema. Emphysema ([4A]-[4A]).
5. Nonspecific fibrotic scarring of the right greater than left lung
apices, likely sequelae of prior infection or inflammation.
6. Coronary artery disease.  Aortic Atherosclerosis ([4A]-[4A]).

## 2020-04-27 ENCOUNTER — Other Ambulatory Visit: Payer: Self-pay

## 2020-04-27 ENCOUNTER — Encounter: Payer: Self-pay | Admitting: Gastroenterology

## 2020-04-27 ENCOUNTER — Ambulatory Visit (INDEPENDENT_AMBULATORY_CARE_PROVIDER_SITE_OTHER): Payer: Medicare Other | Admitting: Vascular Surgery

## 2020-04-27 ENCOUNTER — Ambulatory Visit (INDEPENDENT_AMBULATORY_CARE_PROVIDER_SITE_OTHER): Payer: Medicare Other | Admitting: Gastroenterology

## 2020-04-27 VITALS — BP 120/50 | HR 121 | Ht 72.0 in | Wt 164.0 lb

## 2020-04-27 VITALS — BP 121/71 | HR 100 | Temp 98.2°F | Resp 20 | Ht 72.0 in | Wt 155.0 lb

## 2020-04-27 DIAGNOSIS — Z8774 Personal history of (corrected) congenital malformations of heart and circulatory system: Secondary | ICD-10-CM | POA: Diagnosis not present

## 2020-04-27 DIAGNOSIS — K449 Diaphragmatic hernia without obstruction or gangrene: Secondary | ICD-10-CM

## 2020-04-27 DIAGNOSIS — K922 Gastrointestinal hemorrhage, unspecified: Secondary | ICD-10-CM | POA: Diagnosis not present

## 2020-04-27 DIAGNOSIS — I739 Peripheral vascular disease, unspecified: Secondary | ICD-10-CM

## 2020-04-27 DIAGNOSIS — Z7901 Long term (current) use of anticoagulants: Secondary | ICD-10-CM

## 2020-04-27 DIAGNOSIS — Z86010 Personal history of colonic polyps: Secondary | ICD-10-CM

## 2020-04-27 DIAGNOSIS — D649 Anemia, unspecified: Secondary | ICD-10-CM

## 2020-04-27 MED ORDER — CEPHALEXIN 500 MG PO CAPS: 500.0000 mg | ORAL_CAPSULE | Freq: Three times a day (TID) | ORAL | 0 refills | Status: DC

## 2020-04-27 NOTE — Patient Instructions (Addendum)
Continue your Pantoprazole 40mg  twice daily through Nov 2021.      Decrease to 40mg  once daily starting in Dec. You may then decrease to 20mg  once daily in January 2022.   Stay on 20mg  of Pantoprazole once daily until your follow up visit in Feb 2022. Office will call to schedule that appointment when it gets closer to time.    Dr Rush Landmark has recommended labs. You have requested to have those done with your Primary Physicians office. Please have them to forward results to our office.   We will see you in Feb 2022. Sooner if needed.   If you are age 104 or older, your body mass index should be between 23-30. Your Body mass index is 22.24 kg/m. If this is out of the aforementioned range listed, please consider follow up with your Primary Care Provider.  If you are age 32 or younger, your body mass index should be between 19-25. Your Body mass index is 22.24 kg/m. If this is out of the aformentioned range listed, please consider follow up with your Primary Care Provider.    Thank you for choosing me and [facility].  Dr. Rush Landmark

## 2020-04-27 NOTE — Progress Notes (Signed)
Patient is a 74 year old male who returns for follow-up today.  He underwent left femoral endarterectomy for rest pain and early tissue loss July 2021.  He has previously undergone right common femoral endarterectomy October 2020.  He presents today with worsening gangrenous changes left first toe tip and some drainage.  He has no fever or chills.  He also has some occasional drainage from the left fifth toe.  There is no eschar on that one.  He is fairly debilitated by underlying pulmonary dysfunction and is on continuous home oxygen.  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  Small ulceration on the tip left fifth toe with some fibrinous exudate 2 x 2 mm.  Assessment: Patient at risk for limb loss left leg.  He has dry gangrenous changes of his left foot.  He is not really a revascularization candidate further than what he is already had performed due to his underlying pulmonary dysfunction.  Plan: I discussed with patient today the possibility of amputation of the tip of his left first toe versus conservative management with oral antibiotics to see if we can get the toe to heal up.  I discussed with him that he is at high risk of limb loss in either plan.  He has opted for oral antibiotic coverage right now rather than another operation.  He will follow up with me in 2 weeks time to recheck the left foot.  To follow-up sooner if he has worsening of his overall condition.  He was given a prescription today for Keflex 500 mg 3 times a day for 2-week course.  He will follow up with me in 2 weeks.  Ruta Hinds, MD [facility] Office: [phone]

## 2020-04-30 ENCOUNTER — Encounter: Payer: Self-pay | Admitting: Gastroenterology

## 2020-04-30 DIAGNOSIS — Z8774 Personal history of (corrected) congenital malformations of heart and circulatory system: Secondary | ICD-10-CM | POA: Insufficient documentation

## 2020-04-30 DIAGNOSIS — D649 Anemia, unspecified: Secondary | ICD-10-CM | POA: Insufficient documentation

## 2020-04-30 DIAGNOSIS — Z7901 Long term (current) use of anticoagulants: Secondary | ICD-10-CM | POA: Insufficient documentation

## 2020-04-30 DIAGNOSIS — K449 Diaphragmatic hernia without obstruction or gangrene: Secondary | ICD-10-CM | POA: Insufficient documentation

## 2020-04-30 DIAGNOSIS — K922 Gastrointestinal hemorrhage, unspecified: Secondary | ICD-10-CM | POA: Insufficient documentation

## 2020-04-30 NOTE — Progress Notes (Signed)
[facility] VISIT   Primary Care Provider [name], [place] [address] [phone]  Patient Profile: Francisco [name] is a 74 y.o. male with a pmh significant for COPD (on home O2), PAD, CHF, prior PE, hyperlipidemia, colon polyps, duodenal AVMs.  The patient presents to the [facility] for an evaluation and management of problem(s) noted below:  Problem List 1. H/O arteriovenous malformation (AVM)   2. History of upper GI bleeding   3. Anemia, unspecified type   4. Hiatal hernia   5. History of colonic polyps   6. Chronic anticoagulation     History of Present Illness This is a patient whom I met in August who presented with anemia and melena while on aspirin in addition to anticoagulation.  He was a very increased risk individual in the setting of his underlying COPD and home oxygen use however it was felt that evaluation was necessary.  Upper endoscopy showed evidence of active GI bleeding in the setting of angioectasias and spots in the duodenal region.  Patient had these treated subsequently after.  Time was restarted on anticoagulation.  He has done very well since then and returns with his wife.  They have not had any significant issues over the course of the last few weeks.  Unfortunately, the patient has been reevaluated by vascular surgery and there is concern as to the potential need for a toe amputation.  Otherwise, the patient is doing and feeling well.  The patient denies any other significant GI issues.  He and his wife are not sure about any further endoscopic evaluation or procedures unless absolutely necessary due to his advanced COPD.  The patient does have personal history of colon polyps (adenomatous) with his last colonoscopy being 10 years ago.  He continues to take PPI 40 mg twice daily.  GI Review of Systems Positive as above Negative for pyrosis, dysphagia, odynophagia, nausea,  vomiting, change in bowel habits, melena, hematochezia  Review of Systems General: Denies fevers/chills Cardiovascular: Denies chest pain Pulmonary: Shortness of breath at baseline Gastroenterological: See HPI Genitourinary: Denies darkened urine or hematuria Hematological: Positive for easy bruising/bleeding due to anticoagulation Dermatological: Denies jaundice Psychological: Mood is stable   Medications Current Outpatient Medications  Medication Sig Dispense Refill  . acetaminophen (TYLENOL) 500 MG tablet Take 1,000 mg by mouth every 6 (six) hours as needed for moderate pain.     [name] Kitchen albuterol (VENTOLIN HFA) 108 (90 Base) MCG/ACT inhaler USE 2 PUFFS EVERY 6 HOURS  AS NEEDED FOR WHEEZING 18 g 3  . apixaban (ELIQUIS) 2.5 MG TABS tablet Take 1 tablet (2.5 mg total) by mouth 2 (two) times daily. 180 tablet 3  . budesonide-formoterol (SYMBICORT) 160-4.5 MCG/ACT inhaler Inhale 2 puffs into the lungs 2 (two) times daily.    . Calcium Carbonate-Vitamin D (CALCIUM 600+D PO) Take 2 tablets by mouth daily.    . cephALEXin (KEFLEX) 500 MG capsule Take 1 capsule (500 mg total) by mouth 3 (three) times daily. 42 capsule 0  . cycloSPORINE (RESTASIS) 0.05 % ophthalmic emulsion Place 1 drop into both eyes 2 (two) times daily as needed (dry eyes).     . DALIRESP 500 MCG TABS tablet TAKE 1 TABLET BY MOUTH  DAILY 90 tablet 3  . fluticasone (FLONASE) 50 MCG/ACT nasal spray Place 2 sprays into both nostrils daily. 48 g 3  . folic acid (FOLVITE) 1 MG tablet Take 1 tablet (1 mg total) by mouth daily.    [name] Kitchen  furosemide (LASIX) 20 MG tablet TAKE 1 TABLET BY MOUTH  DAILY (Patient taking differently: Take 20 mg by mouth daily. ) 90 tablet 3  . Multiple Vitamin (MULTIVITAMIN WITH MINERALS) TABS tablet Take 1 tablet by mouth daily.    . OXYGEN Inhale 4-5 L into the lungs continuous.     . pantoprazole (PROTONIX) 40 MG tablet Take 1 tablet (40 mg total) by mouth 2 (two) times daily. 180 tablet 3  . predniSONE  (DELTASONE) 10 MG tablet Take 1 tablet (10 mg total) by mouth daily with breakfast. 90 tablet 0  . Respiratory Therapy Supplies (FLUTTER) DEVI 10 times Twice a day and prn as needed, may increase if feeling worse 1 each 0  . rosuvastatin (CRESTOR) 10 MG tablet Take 1 tablet (10 mg total) by mouth daily. 90 tablet 3  . sodium chloride HYPERTONIC 3 % nebulizer solution Take by nebulization daily. (Patient taking differently: Take 4 mLs by nebulization daily as needed (to loosen phlegm). ) 90 mL 12  . thiamine 100 MG tablet Take 1 tablet (100 mg total) by mouth daily.    . Tiotropium Bromide Monohydrate (SPIRIVA RESPIMAT) 2.5 MCG/ACT AERS USE 2 INHALATIONS BY MOUTH  ONCE DAILY 12 g 3  . traMADol (ULTRAM) 50 MG tablet Take 1 tablet (50 mg total) by mouth every 12 (twelve) hours as needed for moderate pain or severe pain. 90 tablet 1  . azithromycin (ZITHROMAX) 250 MG tablet Take 1 tablet (250 mg total) by mouth every Monday, Wednesday, and Friday. (Patient not taking: Reported on 04/27/2020) 45 tablet 1   No current facility-administered medications for this visit.    Allergies Allergies  Allergen Reactions  . Tape Other (See Comments)    SKIN IS VERY THIN, TEARS SKIN; CAN ONLY USE COBAN WRAPS DUE TO CONDITION OF SKIN!!  . Ciprofloxacin Nausea Only    Sick on stomach, weak/tired  . Levaquin [Levofloxacin] Other (See Comments)    hallucinations  . Lipitor [Atorvastatin] Rash    Histories Past Medical History:  Diagnosis Date  . Chronic back pain    "mid and lower" (04/07/2018)  . Chronic rhinitis    -Sinus Ct 08/01/2009 >> Bilateral maxillary sinusitis with some mucosal thickeningin the sphenoid and frontal sinuses as well with air fluid levels present -chronic rhinitis flyer Aug 04, 2009  . Compressed spine fracture ([place])   . COPD (chronic obstructive pulmonary disease) [facility])    PFT's rec Jul 17, 2009  . Emphysema lung ([place])   . On home oxygen therapy    "3L; 24/7" (04/07/2018)  .  Onychomycosis    Dr. Judi Cong  . Orthostatic hypotension    "since 10/2017" (04/07/2018)  . PAD (peripheral artery disease) ([place])   . Pneumonia    "twice in 1 year" (04/07/2018)  . Pulmonary embolism ([place]) 04/07/2018  . Skin cancer    "lips, face, ears, arms" (04/07/2018)  . Vertigo    "since ~ 02/2018" (04/07/2018)   Past Surgical History:  Procedure Laterality Date  . ABDOMINAL AORTOGRAM W/LOWER EXTREMITY N/A 01/21/2020   Procedure: ABDOMINAL AORTOGRAM W/LOWER EXTREMITY;  Surgeon: Elam Dutch, MD;  Location: [place] [facility];  Service: Cardiovascular;  Laterality: N/A;  . APPENDECTOMY    . BIOPSY  02/20/2020   Procedure: BIOPSY;  Surgeon: Rush Landmark Telford Nab., MD;  Location: [place];  Service: Gastroenterology;;  . CATARACT EXTRACTION, BILATERAL    . ENDARTERECTOMY FEMORAL Right 04/19/2019   Procedure: ENDARTERECTOMY RIGHT COMMON FEMORAL;  Surgeon: Elam Dutch, MD;  [facility]: [facility];  Service: Vascular;  Laterality: Right;  . ENDARTERECTOMY FEMORAL Left 01/24/2020   Procedure: LEFT FEMORAL ENDARTERECTOMY WITH DACRON PATCH ANGIOPLASTY;  Surgeon: Elam Dutch, MD;  Location: [facility] OR;  Service: Vascular;  Laterality: Left;  . ESOPHAGOGASTRODUODENOSCOPY (EGD) WITH PROPOFOL N/A 02/20/2020   Procedure: ESOPHAGOGASTRODUODENOSCOPY (EGD) WITH PROPOFOL;  Surgeon: Irving Copas., MD;  Location: [place];  Service: Gastroenterology;  Laterality: N/A;  . FEMORAL ENDARTERECTOMY Left 01/24/2020  . HEMOSTASIS CLIP PLACEMENT  02/20/2020   Procedure: HEMOSTASIS CLIP PLACEMENT;  Surgeon: Irving Copas., MD;  Location: [place];  Service: Gastroenterology;;  . HOT HEMOSTASIS N/A 02/20/2020   Procedure: HOT HEMOSTASIS (ARGON PLASMA COAGULATION/BICAP);  Surgeon: Irving Copas., MD;  Location: [place];  Service: Gastroenterology;  Laterality: N/A;  . INSERTION OF ILIAC STENT Left 01/24/2020   Procedure: INSERTION OF VBX STENT 8X59 AND 8X39 LEFT COMMON ILIAC  ARTERY. INSERTION OF INNOVA 7 X 60 INNOVA STENT LEFT EXTERNAL ILIAC ARTERY. ;  Surgeon: Elam Dutch, MD;  Location: [place];  Service: Vascular;  Laterality: Left;  . LOWER EXTREMITY ANGIOGRAPHY  12/11/2018  . LOWER EXTREMITY ANGIOGRAPHY N/A 12/11/2018   Procedure: LOWER EXTREMITY ANGIOGRAPHY;  Surgeon: Elam Dutch, MD;  Location: [place] [facility];  Service: Cardiovascular;  Laterality: N/A;  . PATCH ANGIOPLASTY Right 04/19/2019   Procedure: Patch Angioplasty;  Surgeon: Elam Dutch, MD;  Location: [place];  Service: Vascular;  Laterality: Right;  . PERIPHERAL VASCULAR INTERVENTION Right 12/11/2018   Procedure: PERIPHERAL VASCULAR INTERVENTION;  Surgeon: Elam Dutch, MD;  Location: [place] [facility];  Service: Cardiovascular;  Laterality: Right;  Common Iliac   . SKIN CANCER EXCISION     "lips, face, ears, arms" (04/07/2018)  . TONSILLECTOMY    . ULTRASOUND GUIDANCE FOR VASCULAR ACCESS Right 01/24/2020   Procedure: ULTRASOUND GUIDANCE FOR VASCULAR ACCESS;  Surgeon: Elam Dutch, MD;  Location: [facility] OR;  Service: Vascular;  Laterality: Right;   Social History   Socioeconomic History  . Marital status: Married    Spouse name: Not on file  . Number of children: 2  . Years of education: 3 years of college  . Highest education level: Not on file  Occupational History    Employer: [employer]  Tobacco Use  . Smoking status: Former Smoker    Packs/day: 2.00    Years: 52.00    Pack years: 104.00    Types: Pipe, Cigarettes    Start date: 10/06/1965    Quit date: 10/06/2017    Years since quitting: 2.5  . Smokeless tobacco: Never Used  . Tobacco comment: 04/07/2018 "smoked cigarettes years ago; stopped ~ 20 yr ago; stopped smoking pipe in 09/2017"  Vaping Use  . Vaping Use: Never used  Substance and Sexual Activity  . Alcohol use: Yes    Alcohol/week: 28.0 standard drinks    Types: 28 Cans of beer per week    Comment: "04/15/2019 "4 beers/day; probably closer to 28/wk"  . Drug  use: No  . Sexual activity: Not Currently  Other Topics Concern  . Not on file  Social History Narrative   Married (wife Katy Apo patient of Dr. Yong Channel) with children (2-son and daughter). 1 grandson.       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       Hobbies: fishing, projects at home   Social Determinants of Health   Financial Resource Strain:   . Difficulty of Paying  Living Expenses: Not on file  Food Insecurity:   . Worried About Charity fundraiser in the Last Year: Not on file  . Ran Out of Food in the Last Year: Not on file  Transportation Needs:   . Lack of Transportation (Medical): Not on file  . Lack of Transportation (Non-Medical): Not on file  Physical Activity:   . Days of Exercise per Week: Not on file  . Minutes of Exercise per Session: Not on file  Stress:   . Feeling of Stress : Not on file  Social Connections:   . Frequency of Communication with Friends and Family: Not on file  . Frequency of Social Gatherings with Friends and Family: Not on file  . Attends Religious Services: Not on file  . Active Member of Clubs or Organizations: Not on file  . Attends Archivist Meetings: Not on file  . Marital Status: Not on file  Intimate Partner Violence:   . Fear of Current or Ex-Partner: Not on file  . Emotionally Abused: Not on file  . Physically Abused: Not on file  . Sexually Abused: Not on file   Family History  Problem Relation Age of Onset  . Heart disease Mother        CABG in her 35s, nonsmoker  . Cancer Mother   . Stroke Father   . Heart disease Father        Died of MI at age 6, smoker  . Hepatitis Sister   . Coronary artery disease Other        male 1st degree relative <60  . Colon cancer Neg Hx   . Pancreatic cancer Neg Hx   . Esophageal cancer Neg Hx   . Inflammatory bowel disease Neg Hx   . Liver disease Neg Hx   . Rectal cancer Neg Hx   . Stomach cancer Neg Hx    I have reviewed his medical,  social, and family history in detail and updated the electronic medical record as necessary.    PHYSICAL EXAMINATION  BP (!) 120/50   Pulse (!) 121   Ht 6' (1.829 m)   Wt 164 lb (74.4 kg)   SpO2 96% Comment: on 3 liter O2  BMI 22.24 kg/m  Wt Readings from Last 3 Encounters:  04/27/20 164 lb (74.4 kg)  04/27/20 155 lb (70.3 kg)  03/14/20 155 lb (70.3 kg)  GEN: NAD, appears older than stated age, appears chronically ill but nontoxic, accompanied by wife PSYCH: Cooperative, without pressured speech EYE: Conjunctivae pink, sclerae anicteric ENT: MMM, nasal trumpet on CV: Tachycardic RESP: Rales and wheezing present GI: NABS, soft, NT/ND, without rebound or guarding MSK/EXT: Bilateral lower extremity edema present SKIN: No jaundice NEURO:  Alert & Oriented x 3, no focal deficits   REVIEW OF DATA  I reviewed the following data at the time of this encounter:  GI Procedures and Studies  August 2021 EGD - No gross lesions in esophagus. Z-line irregular, 40 cm from the incisors. - 3 cm hiatal hernia. - No gross lesions in the stomach. Biopsied for HP. - Blood in the duodenal bulb and in the second portion of the duodenum. Lavaged with adequate visualization. - Using the Duodenoscope, a single angioectasia in the duodenum sweep was noted. Treated with argon plasma coagulation (APC). Clips (MR conditional) was placed. - Duodenitis with 3 areas of active oozing. Treated with argon plasma coagulation (APC). Clips (MR conditional) were placed to those regions. - Normal major papilla.  Laboratory Studies  Reviewed those in epic  Imaging Studies  No relevant studies to review   ASSESSMENT  [name] is a 74 y.o. male with a pmh significant for COPD (on home O2), PAD, CHF, prior PE, hyperlipidemia, colon polyps, duodenal AVMs.  The patient is seen today for evaluation and management of:  1. H/O arteriovenous malformation (AVM)   2. History of upper GI bleeding   3. Anemia,  unspecified type   4. Hiatal hernia   5. History of colonic polyps   6. Chronic anticoagulation    The patient is hemodynamically and clinically stable from a GI perspective.  He seems to have been doing very well since the hospitalization.  The patient is a high risk individual for repeat endoscopic evaluation.  At this point in time we are going to monitor his counts closely.  No plan for repeat evaluation at this time.  However, if the patient redevelops anemia or has persistent anemia with iron deficiency then we will have to consider the role of repeat endoscopic evaluation both from above and potentially below (he has a history of prior colon polyps and is overdue for repeat colonoscopy).  Recommend hold on these procedures for now and reevaluate things when we see him back in February.  Certainly I think undergoing a colonoscopic evaluation is going to be higher risk and we would have to be very thoughtful if we did a colonoscopy versus consideration of a CT colonography versus just monitoring the patient closely if his blood counts improved.  These will be discussions for [place] in the coming months.  We will asked the patient to continue twice daily Protonix through November 2021 and then decrease to 40 mg once daily in December and then go down to 20 mg once daily starting in January.  We will see how your labs look in the next 6 weeks (it will be obtained at the time of your primary care provider follow-up).  We will see back in clinic in February or sooner if iron deficiency anemia is found.  All patient questions were answered to the best of my ability, and the patient and wife agree to the aforementioned plan of action with follow-up as indicated.   PLAN  Omeprazole 40 mg twice daily through November 2021 -Omeprazole 40 mg once daily through December 2021 -Omeprazole 20 mg once daily thereafter CBC/iron/TIBC/ferritin to be drawn in approximately 6 weeks (at time of PCP visit) If evidence of  iron deficiency plus/minus anemia are present will consider role of repeat endoscopic evaluation from above and potentially below Otherwise if things are stable when we see in follow-up will consider role of CT colonography versus FIT/Cologuard versus colonoscopy   Orders Placed This Encounter  Procedures  . CBC  . IBC + Ferritin    New Prescriptions   No medications on file   Modified Medications   No medications on file    Planned Follow Up No follow-ups on file.   Total Time in Face-to-Face and in Coordination of Care for patient including independent/personal interpretation/review of prior testing, medical history, examination, medication adjustment, communicating results with the patient directly, and documentation with the EHR is 30 minutes.   Justice Britain, MD [facility] Office # [phone]

## 2020-05-02 ENCOUNTER — Other Ambulatory Visit: Payer: Self-pay | Admitting: Family Medicine

## 2020-05-03 ENCOUNTER — Other Ambulatory Visit: Payer: Self-pay

## 2020-05-03 ENCOUNTER — Other Ambulatory Visit: Payer: Self-pay | Admitting: Internal Medicine

## 2020-05-03 ENCOUNTER — Encounter: Payer: Self-pay | Admitting: Internal Medicine

## 2020-05-03 ENCOUNTER — Ambulatory Visit (INDEPENDENT_AMBULATORY_CARE_PROVIDER_SITE_OTHER): Payer: Medicare Other | Admitting: Internal Medicine

## 2020-05-03 VITALS — BP 106/66 | HR 101 | Temp 97.5°F | Ht 72.0 in | Wt 166.0 lb

## 2020-05-03 DIAGNOSIS — R918 Other nonspecific abnormal finding of lung field: Secondary | ICD-10-CM

## 2020-05-03 DIAGNOSIS — J449 Chronic obstructive pulmonary disease, unspecified: Secondary | ICD-10-CM | POA: Diagnosis not present

## 2020-05-03 DIAGNOSIS — Z7185 Encounter for immunization safety counseling: Secondary | ICD-10-CM

## 2020-05-03 DIAGNOSIS — J441 Chronic obstructive pulmonary disease with (acute) exacerbation: Secondary | ICD-10-CM | POA: Diagnosis not present

## 2020-05-03 DIAGNOSIS — J9611 Chronic respiratory failure with hypoxia: Secondary | ICD-10-CM | POA: Diagnosis not present

## 2020-05-03 DIAGNOSIS — I2699 Other pulmonary embolism without acute cor pulmonale: Secondary | ICD-10-CM

## 2020-05-03 NOTE — Progress Notes (Signed)
ROV 12/30/16 -- patient has a history of tobacco use, COPD currently maintained on chronic prednisone, Symbicort, Spiriva. Most recent blurry function testing was March 2011 with an FEV1 of 1.2 L (34% Pred), severe obstruction. He up titrates his prednisone on his own based on symptoms. No abx since last time. He has increased pred temporrily about 4 -5 x since last time. No increases for over a month. He is smoking a pipe, not cigarettes. He is using HCTZ more frequently these days. He is on flonase and singulair. He does note that he has some slow progression of his DOE. He coughs a few times a day, clears clear sputum.   rov 07/17/17 --this is a follow-up visit for patient with active tobacco use, severe COPD and chronic prednisone use.  He often titrates his prednisone on his own depending on how he is feeling. He complains of a new pain in his back below his left shoulder blade, has been present for the last 10 days. sometimes pleuritic, can be stabbing, worse w cough. His pred use: on 10mg  for the last 19 days. No hx VTE. He has LE edema, venous stasis changes, increased since his diuretics decreased.   ROV 09/23/17 --74 year old man with a history of tobacco use (still smokes pipe), severe COPD and severe obstruction on spirometry.  At his last visit he had some pleuritic left back pain and I performed a CT PA that I have reviewed from 07/17/17.  There was no pulmonary embolism but extensive emphysema present, some bilateral apical scar more so on the right, stable.  He uses prednisone 10 mg daily and uptitrate depending on his day-to-day symptoms.  He is otherwise managed on Symbicort and Spiriva.  Desaturated on arrival today after ambulating to the exam room. He reports more dyspnea, has increased pred intermittently.    OV 12/05/2017  Chief Complaint  Patient presents with  . Follow-up    Switching from RB to MR.   Pt is on 3L with exertion and 2L at rest. Pt states his breathing had  become worse but since he has been on O2 since 3/21, it has really helped with his breathing.  Pt was in hospital 4/28-5/3. Other than SOB, pt does have c/o cough, rattling in chest. Denies any CP/chest tightness.   History from patient, his wife and review of the old chart  Francisco [name] a transfer of care from Francisco [name] to myself Dr. Chase [name].  His son is my patient and therefore he is done this transfer of care.  According to the patient he is to be seen by Dr. Gwenette [name] for COPD.  Patient is FEV1 34% based on March 2011 PFTs according to chart review.  At baseline he is maintained on Spiriva, Symbicort, Singulair, chronic daily prednisone 10 mg/day for at least 3 years, and 3 times a week of azithromycin for 2 years.  Earlier this year in March 2019 he went on daytime oxygen and following a recent hospitalization April-May 2019.  For syncope that was believed due to nocturnal hypoxemia he was started on night oxygen.  Since starting oxygen his edema has improved and his overall quality of life is improved and he stopped having any orthostasis or presyncopal episodes.  He is grateful for the fact that he is on oxygen.  His current COPD CAT score and severity of symptoms is rated below and is 26.  Show significant amount of symptom burden.  His main goal is to improve his  quality of life.  He had his wife have many questions centering around quality of life, medication therapy.  Of note he has had some thoracic vertebral fractures that are new.  This happened after the fall in late April 2019.  Discovered at admission last month.  He feels is due to prednisone.  He is wondering about portable oxygen.   OV 01/29/2018  Chief Complaint  Patient presents with  . Follow-up    Pt states he has been doing okay since last visit. States breathing is about the same, has an occ cough but states the O2 has helped out a lot with breathing and cough. Denies any complaints of CP.   Francisco [name] , 74 y.o. ,  with dob 02/27/1946 and male ,Not Hispanic or Latino from [place] [id number] - presents to lung clinic for advanced COPD follow-up. Since his last visit his symptoms course of improvement. Score is 26 and severe symptoms of document below. He is on Spiriva, Symbicort, continues oxygen, daily prednisone and azithromycin 3 times a week. He has stopped his singulair without any problems. Overall he says he is better. He is willing to give TRELEGY inhaler trial. Last visit to check blood gas and he does not have hypercapnia so he does not qualify for BiPAP        OV 04/03/2018  Subjective:  Patient ID: Francisco [name], male , DOB: 06/04/1946 , age 35 y.o. , MRN: [id number] , ADDRESS: [place] [address]   04/03/2018 -   Chief Complaint  Patient presents with  . Acute Visit    Pt's O2 sats have been dropping into the 70s with little exertion.  Pt took O2 off just to shave 9/26 and after he finished shaving put the O2 back on, walked from the bathroom down to the living room on O2 and sats were at 77% on 3L 9/26. Pt also has c/o cough with white to yellow mucus and chest tightness with the SOB.     HPI Francisco [name] 74 y.o. -acute visit for this patient.  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  He is chronically prednisone dependent.  He also is on schedule azithromycin.  He called in yesterday feeling unwell therefore we asked him to come in today.  He tells me that approximately a week ago he started having increased cough and congestion.  Yesterday primary care physician following a routine visit thought he was in COPD exacerbation and started him on doxycycline.  He also personally bumped up his baseline prednisone.  He does me that yesterday while he was shaving on room air he felt more short of breath than usual.  Also when he walked he desaturated more than usual therefore he decided to call in and come in today.   There is no fever or chills or hemoptysis or colored sputum.  No leg edema.  No orthopnea.  He does not feel like he needs to be in the emergency department or get admitted.   OV 05/11/2018  Subjective:  Patient ID: Francisco [name], male , DOB: 04/19/1946 , age 22 y.o. , MRN: [id number] , ADDRESS: [place] [address]   05/11/2018 -   Chief Complaint  Patient presents with  . Follow-up    pt states breathing has wrosen since last OV. increased sob, occ chest tightness, prod cough with yellow mucus &  chest congestion. currently taking trelegy samples. recent admission 04/07/18 for PE.     HPI Francisco [name] 74 y.o. -advanced COPD with chronic hypoxemic respiratory failure [not a BiPAP candidate because of lack of hypercapnia] presents with his wife for follow-up.  After the last visit he continued to have symptoms so on April 07, 2018 he got an outpatient CT scan and he had pulmonary embolism.  He got admitted to the hospital and then discharged and since then has been on Xarelto.  He continues his baseline 2-3 L of nasal cannula oxygen but he says since then he is more symptomatic.  Is also corresponds with him starting Trelegy inhaler and also for the last few to several weeks is having increased cough and congestion and yellow phlegm.  His COPD CAT score is deteriorated to 35 and above.  He is very frustrated by his symptoms.  Review of his medication shows that he takes azithromycin and chronic prednisone and Trelegy and oxygen for his COPD.  He is on Xarelto for his blood clots.  He is also on Fosamax for prednisone dependent osteoporosis management.  His smoking is in remission.  Wife says he is always sedentary.  Apparently they tried to do some physical therapy a month ago but he desaturated.  He does not seem to keen on physical therapy right now     OV 06/10/2018  Subjective:  Patient ID: Francisco [name], male , DOB: [birth year] , age 59 y.o. , MRN: [id number] ,  ADDRESS: [place] [address]   06/10/2018 -   Chief Complaint  Patient presents with  . Follow-up    follows for COPD. patient has increased SOB with exertion     HPI Francisco [name] 74 y.o. -returns for gold stage IV COPD follow-up with chronic hypoxemic respiratory failure.  I saw him approximately a month ago at which time recommended Trelegy.  However the Trelegy is not working well for him.  He feels Spiriva and Symbicort is of benefit for him.  Also recommended he start himself on Daliresp.  Given the GI side effect profile I communicated with his primary care physician about stopping Fosamax.  His primary care physician has advised Reclast infusion for him and is considering this but has not yet switched over.  This because the infusions will have to happen at [facility] long.  However he never started his Daliresp because he read the side effect profile and was worried about back pain.  He wanted me to go over the side effect profile of Daliresp all over again.  I printed up-to-date and went over all the side effects.  I did this with him and his wife.  There is a 3% incidence of backache.  The dominant feature is GI issues of weight loss and diarrhea and nausea and abdominal pain.  The second dominant feature is some anxiety issues.  After reading all this he has decided to start [place].  He is aware of the limitations as well.  He is aware that this is purely preventive in reducing COPD exacerbation frequency.  He understands that the burden on his health from frequent COPD exacerbations is high.  Currently COPD CAT score is back at baseline        OV 07/22/2018  Subjective:  Patient ID: Francisco [name], male , DOB: 10/06/1945 , age 76 y.o. , MRN: [id number] , ADDRESS: [place] [address]   07/22/2018 -  Chief Complaint  Patient presents with  . Follow-up    Pt states it has been rough since last visit. States he has been coughing with  white to yellow phlegm, wheezing, increased SOB which has been going on x2-3 weeks now. Pt denies any real complaints of chest tightness/chest pain.     HPI Francisco [name] 74 y.o. -presents for follow-up of his advanced COPD with chronic hypoxemic respiratory failure.  At last visit we introduce Daliresp as is an effort to prevent COPD flareups.  After some trepidation he started taking it.  He is currently on full dose 5 mcg daily and is tolerating it well.  However he does not think it is reduced the frequency of flareups but again it is too soon to tell.  Approximately over a week ago after some sick contact exposure he had symptoms and signs of COPD exacerbation.  He was given I suspect Levaquin but this caused some confusion.  After that he was changed to doxycycline.  Today's his last of 7 days with doxycycline he is better.  COPD CAT score is improved to 26 but he still is coughing quite a bit and has mucus and feels that he is still not fully back to baseline.  He had a chest x-ray July 13, 2018 that reports this is clear.  He continues on oxygen Spiriva and Symbicort.  There are no other new issues.  No chest pain edema hemoptysis or weight loss.    OV 03/05/2019  Subjective:  Patient ID: Francisco [name], male , DOB: 05/20/1946 , age 70 y.o. , MRN: [id number] , ADDRESS: [place] [address]   03/05/2019 -   Chief Complaint  Patient presents with  . COPD with acute exacerbation    Feels it is a little worse since June. Has cough with congestion, was white in color. But this morning it has a yellow and green color to it.     HPI Francisco [name] 74 y.o. -returns for his advanced COPD follow-up.  He says that since his last visit he has been doing overall fair and stable.  Uses 3 L of oxygen, Daliresp, Spiriva, Symbicort.  He has been avoiding risk activities for COVID-19.  He says in the last few weeks he has had increased congestion he does not think this is  COVID-19.  Today he has green-yellow sputum.  He is asking about taking a flu shot.    OV 05/07/2019  Subjective:  Patient ID: Francisco [name], male , DOB: 11/16/1945 , age 64 y.o. , MRN: [id number] , ADDRESS: [place] [id number]   05/07/2019 -   Chief Complaint  Patient presents with  . Follow-up    Pt states his breathing is at his baseline. Pt c/o prod cough with clear mucus - baseline for pt. Pt denies CP/tightness and f/c/s.    Advance COPD with chronic hypoxemic respiratory failure.  Baseline functional status ECOG 3-4  HPI Francisco [name] 74 y.o. -presents for COPD follow-up.  He continues to be stable using oxygen.  His current COPD CAT score is 30 which is his baseline.  He recently had a vascular surgery.  He is on oxygen, prednisone, Daliresp, Spiriva and Symbicort.  He is gone back to taking azithromycin for prophylaxis m again COPD exacerbation.        OV 11/11/2019  Subjective:  Patient ID: Francisco [name], male , DOB: 12-22-1945 ,  age 61 y.o. , MRN: [id number] , ADDRESS: [place] [address]   11/11/2019 -   Chief Complaint  Patient presents with  . Follow-up    Pt states his breathing is progressively becoming worse and states he feels like he may be in a flare with the COPD. Pt states he is coughing up yellow-green phlegm and also has complaints of wheezing.   Advance COPD patient with recurrent COPD exacerbations.  HPI Francisco [name] 74 y.o. -presents for follow-up.  He says last month he saw [name] nurse practitioner.  He says he was given a different antibiotic that really helped.  Review of the records indicate one time it was Augmentin another time it was [place].  He believes the second 1 really helped.  He feels in another exacerbation.  He is having green sputum.  He says with the flutter valve and exacerbation a lot of sputum comes out but between exacerbations is very minimal.  He is frustrated by his  recurrent exacerbations.  He did not tolerate Trelegy in the past.  He is on Spiriva, Symbicort, oxygen 3 L, flutter valve, Flonase, Roflumilast and 3 times a week of azithromycin.  He is looking to see if he can improve his quality of life.  His COPD CAT score is 34.  His last CT scan of the chest was in 2019.     CAT COPD Symptom & Quality of Life Score (GSK trademark) 0 is no burden. 5 is highest burden 12/05/2017  01/29/2018  05/11/2018  06/10/2018  07/22/2018  03/05/2019  05/07/2019   Never Cough -> Cough all the time 3 1 5 5 2 3 3   No phlegm in chest -> Chest is full of phlegm 2 2 4 5 3  3.5 3  No chest tightness -> Chest feels very tight 1 0 3 0 1 2 3   No dyspnea for 1 flight stairs/hill -> Very dyspneic for 1 flight of stairs 5 4 5 5 5 5 5   No limitations for ADL at home -> Very limited with ADL at home 5 5 5 5 5 5 4   Confident leaving home -> Not at all confident leaving home 3 3 3 4 4 3 4   Sleep soundly -> Do not sleep soundly because of lung condition 3 1 2  0 1 4 4   Lots of Energy -> No energy at all 4 4 5 5 5 4 4   TOTAL Score (max 40)  26 20 37 29 26 29.5 30   CAT Score 11/11/2019 03/05/2019 04/03/2018  Total CAT Score 32 30 30     OV 11/26/2019 - telephone visit. 2PHI identified. Risks, benefit, limitation explained  Subjective:  Patient ID: Francisco [name], male , DOB: 1945-10-09 , age 72 y.o. , MRN: [id number] , ADDRESS: [place] [address] Advanced COPD with recurrent exacerbation now colonized by pseudomonas  11/26/2019 -    HPI Francisco [name] 74 y.o. -last seen by myself Nov 11, 2019.  At that time we decided to do a work-up for recurrent exacerbations.  His immunoglobulin levels are normal.  His sputum grew Pseudomonas that is pansensitive.  He CT scan of the chest shows right upper lobe worsening fibrotic pattern.  There is no mass per se.  I personally visualized the CT chest.  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  Today in the visit he tells me after 3 days he had nausea and then he had joint pain on the third day and quit taking it.  He still continues to be symptomatic.     ROS - per HPI  CT Chest Wo Contrast  Result Date: 11/25/2019 CLINICAL DATA:  COPD exacerbation. Severe shortness of breath. EXAM: CT CHEST WITHOUT CONTRAST TECHNIQUE: Multidetector CT imaging of the chest was performed following the standard protocol without IV contrast. COMPARISON:  04/07/2018. FINDINGS: Cardiovascular: The heart size is normal. There is no pericardial effusion. Aortic atherosclerosis. Three vessel coronary artery atherosclerotic calcifications identified Mediastinum/Nodes: No enlarged mediastinal or axillary lymph nodes. Thyroid gland, trachea, and esophagus demonstrate no significant findings. Lungs/Pleura: No pleural effusion identified. Advanced changes of centrilobular and paraseptal emphysema with mild bullous changes. Progressive confluent fibrotic with extensive architectural distortion and volume loss no acute airspace consolidation, atelectasis or pneumothorax. Scattered, bilateral areas of pleuroparenchymal scarring identified changes within the apical portions of the right upper lobe are again noted. Upper Abdomen: Bilateral low-attenuation adrenal nodules appears similar to previous exam and are compatible with benign adenomas. No acute abnormality identified within the imaged portions of the upper abdomen. Musculoskeletal: Mild scoliosis and degenerative disc disease noted within the thoracic spine. Stable chronic compression deformities at T6, T7 and T8. No new compression fractures. IMPRESSION: 1. No acute cardiopulmonary abnormalities. 2. Progressive confluent fibrotic changes within the right upper lobe with extensive architectural distortion and volume loss. Likely the sequelae of prior inflammation/infection. 3. Aortic atherosclerosis,  in addition to 3 vessel coronary artery disease. Please note that although the presence of coronary artery calcium documents the presence of coronary artery disease, the severity of this disease and any potential stenosis cannot be assessed on this non-gated CT examination. Assessment for potential risk factor modification, dietary therapy or pharmacologic therapy may be warranted, if clinically indicated. 4. Bilateral adrenal adenomas. 5. Stable chronic compression deformities at T6, T7 and T8. 6. Diffuse bronchial wall thickening with emphysema, as above; imaging findings suggestive of underlying COPD. Aortic Atherosclerosis (ICD10-I70.0) and Emphysema (ICD10-J43.9). Electronically Signed   By: Kerby Moors M.D.   On: 11/25/2019 12:21     OV 05/03/2020  Subjective:  Patient ID: Francisco [name], male , DOB: 10-13-1945 , age 49 y.o. , MRN: [id number] , ADDRESS: [place] [address] PCP [name], MD Patient Care Team: [name], MD as PCP - General (Family Medicine) Brand Males, MD as Consulting Physician (Pulmonary Disease) Danella Sensing, MD as Consulting Physician (Dermatology) Dr. Henreitta Leber, DDS (Dentistry)  This Provider for this visit: Treatment Team:  Attending Provider: Brand Males, MD    05/03/2020 -   Chief Complaint  Patient presents with  . Follow-up    doing best he has in a year and a half.     ICD-10-CM   1. Stage 4 very severe COPD by GOLD classification ([place])  J44.9   2. Chronic respiratory failure with hypoxia (HCC)  J96.11   3. COPD, frequent exacerbations ([place])  J44.1   4. Abnormal findings on diagnostic imaging of lung  R91.8   5. Pulmonary embolism, unspecified chronicity, unspecified pulmonary embolism type, unspecified whether acute cor pulmonale present (HCC)  I26.99   6. Vaccine counseling  Z71.85      HPI Francisco [name] 74 y.o. -presents with his wife for the above issues.  He said his Covid  booster has had his flu shot.  He has follow-up  coming end of November with primary care physician.  He wants me to send a note to the primary care physician.  He is without any exacerbations.  He continues prednisone, azithromycin, Roflumilast, Spiriva and Symbicort.  He does not want to do triple inhaler therapies.  He is on anticoagulation.  He says after the GI bleeding issues resolved he is better.  He had a CT scan of the chest that is documented below.  His infiltrates are improving but there is mucus/debris in the main airway he is denying any aspiration.  He does have hiatal hernia.  Apparently this was discovered during endoscopy.  CAT Score 05/03/2020 11/11/2019 03/05/2019 04/03/2018  Total CAT Score 16 32 30 30     IMPRESSION: 1. Trace right pleural effusion and associated atelectasis or consolidation, both of which are improved compared to prior examination. Findings are generally consistent with resolving sequelae of prior infection or aspiration. 2. Diffuse bilateral bronchial wall thickening consistent with nonspecific infectious or inflammatory bronchitis. 3. Frothy debris in the bilateral mainstem bronchi, concerning for aspiration. 4. Severe centrilobular emphysema. Emphysema (ICD10-J43.9). 5. Nonspecific fibrotic scarring of the right greater than left lung apices, likely sequelae of prior infection or inflammation. 6. Coronary artery disease.  Aortic Atherosclerosis (ICD10-I70.0).   Electronically Signed   By: Eddie Candle M.D.   On: 04/26/2020 13:42  ROS - per HPI     has a past medical history of Chronic back pain, Chronic rhinitis, Compressed spine fracture ([place]), COPD (chronic obstructive pulmonary disease) ([place]), Emphysema lung ([place]), On home oxygen therapy, Onychomycosis, Orthostatic hypotension, PAD (peripheral artery disease) ([place]), Pneumonia, Pulmonary embolism ([place]) (04/07/2018), Skin cancer, and Vertigo.   reports that he quit smoking about 2 years ago.  His smoking use included pipe and cigarettes. He started smoking about 54 years ago. He has a 104.00 pack-year smoking history. He has never used smokeless tobacco.  Past Surgical History:  Procedure Laterality Date  . ABDOMINAL AORTOGRAM W/LOWER EXTREMITY N/A 01/21/2020   Procedure: ABDOMINAL AORTOGRAM W/LOWER EXTREMITY;  Surgeon: Elam Dutch, MD;  Location: [facility];  Service: Cardiovascular;  Laterality: N/A;  . APPENDECTOMY    . BIOPSY  02/20/2020   Procedure: BIOPSY;  Surgeon: Rush Landmark Telford Nab., MD;  Location: [facility];  Service: Gastroenterology;;  . CATARACT EXTRACTION, BILATERAL    . ENDARTERECTOMY FEMORAL Right 04/19/2019   Procedure: ENDARTERECTOMY RIGHT COMMON FEMORAL;  Surgeon: Elam Dutch, MD;  Location: [facility];  Service: Vascular;  Laterality: Right;  . ENDARTERECTOMY FEMORAL Left 01/24/2020   Procedure: LEFT FEMORAL ENDARTERECTOMY WITH DACRON PATCH ANGIOPLASTY;  Surgeon: Elam Dutch, MD;  Location: [facility];  Service: Vascular;  Laterality: Left;  . ESOPHAGOGASTRODUODENOSCOPY (EGD) WITH PROPOFOL N/A 02/20/2020   Procedure: ESOPHAGOGASTRODUODENOSCOPY (EGD) WITH PROPOFOL;  Surgeon: Irving Copas., MD;  Location: [facility];  Service: Gastroenterology;  Laterality: N/A;  . FEMORAL ENDARTERECTOMY Left 01/24/2020  . HEMOSTASIS CLIP PLACEMENT  02/20/2020   Procedure: HEMOSTASIS CLIP PLACEMENT;  Surgeon: Irving Copas., MD;  Location: [facility];  Service: Gastroenterology;;  . HOT HEMOSTASIS N/A 02/20/2020   Procedure: HOT HEMOSTASIS (ARGON PLASMA COAGULATION/BICAP);  Surgeon: Irving Copas., MD;  Location: [facility];  Service: Gastroenterology;  Laterality: N/A;  . INSERTION OF ILIAC STENT Left 01/24/2020   Procedure: INSERTION OF VBX STENT 8X59 AND 8X39 LEFT COMMON ILIAC ARTERY. INSERTION OF INNOVA 7 X 60 INNOVA STENT LEFT EXTERNAL ILIAC ARTERY. ;  Surgeon: Elam Dutch, MD;  Location: [facility];  Service:  Vascular;   Laterality: Left;  . LOWER EXTREMITY ANGIOGRAPHY  12/11/2018  . LOWER EXTREMITY ANGIOGRAPHY N/A 12/11/2018   Procedure: LOWER EXTREMITY ANGIOGRAPHY;  Surgeon: Elam Dutch, MD;  Location: [facility];  Service: Cardiovascular;  Laterality: N/A;  . PATCH ANGIOPLASTY Right 04/19/2019   Procedure: Patch Angioplasty;  Surgeon: Elam Dutch, MD;  Location: [facility];  Service: Vascular;  Laterality: Right;  . PERIPHERAL VASCULAR INTERVENTION Right 12/11/2018   Procedure: PERIPHERAL VASCULAR INTERVENTION;  Surgeon: Elam Dutch, MD;  Location: [facility];  Service: Cardiovascular;  Laterality: Right;  Common Iliac   . SKIN CANCER EXCISION     "lips, face, ears, arms" (04/07/2018)  . TONSILLECTOMY    . ULTRASOUND GUIDANCE FOR VASCULAR ACCESS Right 01/24/2020   Procedure: ULTRASOUND GUIDANCE FOR VASCULAR ACCESS;  Surgeon: Elam Dutch, MD;  Location: [facility];  Service: Vascular;  Laterality: Right;    Allergies  Allergen Reactions  . Tape Other (See Comments)    SKIN IS VERY THIN, TEARS SKIN; CAN ONLY USE COBAN WRAPS DUE TO CONDITION OF SKIN!!  . Ciprofloxacin Nausea Only    Sick on stomach, weak/tired  . Levaquin [Levofloxacin] Other (See Comments)    hallucinations  . Lipitor [Atorvastatin] Rash    Immunization History  Administered Date(s) Administered  . Fluad Quad(high Dose 65+) 03/16/2019, 03/10/2020  . Influenza Split 04/24/2012  . Influenza Whole 05/08/2009, 04/08/2011  . Influenza, High Dose Seasonal PF 05/01/2017, 03/31/2018  . Influenza,inj,Quad PF,6+ Mos 04/27/2013, 04/15/2014, 04/24/2015  . Influenza,inj,quad, With Preservative 05/01/2018  . Influenza-Unspecified 05/13/2016  . PFIZER SARS-COV-2 Vaccination 07/30/2019, 08/20/2019, 04/07/2020  . Pneumococcal Conjugate-13 02/25/2014  . Pneumococcal Polysaccharide-23 08/15/2011, 12/19/2016  . Pneumococcal-Unspecified 03/08/2017  . Td 02/21/2010  . Tdap 11/05/2017  . Zoster 01/29/2012    Family  History  Problem Relation Age of Onset  . Heart disease Mother        CABG in her 14s, nonsmoker  . Cancer Mother   . Stroke Father   . Heart disease Father        Died of MI at age 61, smoker  . Hepatitis Sister   . Coronary artery disease Other        male 1st degree relative <60  . Colon cancer Neg Hx   . Pancreatic cancer Neg Hx   . Esophageal cancer Neg Hx   . Inflammatory bowel disease Neg Hx   . Liver disease Neg Hx   . Rectal cancer Neg Hx   . Stomach cancer Neg Hx      Current Outpatient Medications:  .  acetaminophen (TYLENOL) 500 MG tablet, Take 1,000 mg by mouth every 6 (six) hours as needed for moderate pain. , Disp: , Rfl:  .  albuterol (VENTOLIN HFA) 108 (90 Base) MCG/ACT inhaler, USE 2 PUFFS EVERY 6 HOURS  AS NEEDED FOR WHEEZING, Disp: 18 g, Rfl: 3 .  apixaban (ELIQUIS) 2.5 MG TABS tablet, Take 1 tablet (2.5 mg total) by mouth 2 (two) times daily., Disp: 180 tablet, Rfl: 3 .  budesonide-formoterol (SYMBICORT) 160-4.5 MCG/ACT inhaler, Inhale 2 puffs into the lungs 2 (two) times daily., Disp: , Rfl:  .  Calcium Carbonate-Vitamin D (CALCIUM 600+D PO), Take 2 tablets by mouth daily., Disp: , Rfl:  .  cephALEXin (KEFLEX) 500 MG capsule, Take 1 capsule (500 mg total) by mouth 3 (three) times daily., Disp: 42 capsule, Rfl: 0 .  cycloSPORINE (RESTASIS) 0.05 % ophthalmic emulsion, Place 1 drop into both eyes  2 (two) times daily as needed (dry eyes). , Disp: , Rfl:  .  DALIRESP 500 MCG TABS tablet, TAKE 1 TABLET BY MOUTH  DAILY, Disp: 90 tablet, Rfl: 3 .  fluticasone (FLONASE) 50 MCG/ACT nasal spray, Place 2 sprays into both nostrils daily., Disp: 48 g, Rfl: 3 .  folic acid (FOLVITE) 1 MG tablet, Take 1 tablet (1 mg total) by mouth daily., Disp: , Rfl:  .  furosemide (LASIX) 20 MG tablet, TAKE 1 TABLET (20 MG TOTAL) BY MOUTH DAILY AS NEEDED FOR FLUID OR EDEMA., Disp: 90 tablet, Rfl: 3 .  Multiple Vitamin (MULTIVITAMIN WITH MINERALS) TABS tablet, Take 1 tablet by mouth daily.,  Disp: , Rfl:  .  OXYGEN, Inhale 4-5 L into the lungs continuous. , Disp: , Rfl:  .  pantoprazole (PROTONIX) 40 MG tablet, Take 1 tablet (40 mg total) by mouth 2 (two) times daily., Disp: 180 tablet, Rfl: 3 .  predniSONE (DELTASONE) 10 MG tablet, Take 1 tablet (10 mg total) by mouth daily with breakfast., Disp: 90 tablet, Rfl: 0 .  Respiratory Therapy Supplies (FLUTTER) DEVI, 10 times Twice a day and prn as needed, may increase if feeling worse, Disp: 1 each, Rfl: 0 .  rosuvastatin (CRESTOR) 10 MG tablet, Take 1 tablet (10 mg total) by mouth daily., Disp: 90 tablet, Rfl: 3 .  sodium chloride HYPERTONIC 3 % nebulizer solution, Take by nebulization daily. (Patient taking differently: Take 4 mLs by nebulization daily as needed (to loosen phlegm). ), Disp: 90 mL, Rfl: 12 .  Tiotropium Bromide Monohydrate (SPIRIVA RESPIMAT) 2.5 MCG/ACT AERS, USE 2 INHALATIONS BY MOUTH  ONCE DAILY, Disp: 12 g, Rfl: 3 .  traMADol (ULTRAM) 50 MG tablet, Take 1 tablet (50 mg total) by mouth every 12 (twelve) hours as needed for moderate pain or severe pain., Disp: 90 tablet, Rfl: 1 .  azithromycin (ZITHROMAX) 250 MG tablet, Take 1 tablet (250 mg total) by mouth every Monday, Wednesday, and Friday. (Patient not taking: Reported on 04/27/2020), Disp: 45 tablet, Rfl: 1      Objective:   Vitals:   05/03/20 1049  BP: 106/66  Pulse: (!) 101  Temp: (!) 97.5 F (36.4 C)  TempSrc: Temporal  SpO2: 99%  Weight: 166 lb (75.3 kg)  Height: 6' (1.829 m)    Estimated body mass index is 22.51 kg/m as calculated from the following:   Height as of this encounter: 6' (1.829 m).   Weight as of this encounter: 166 lb (75.3 kg).  @WEIGHTCHANGE @  [employer]   05/03/20 1049  Weight: 166 lb (75.3 kg)     Physical Exam  General: No distress. [place] Neuro: Alert and Oriented x 3. GCS 15. Speech normal Psych: Pleasant Resp:  Barrel Chest - yes.  Wheeze - no, Crackles - no, No overt respiratory distress CVS: Normal  heart sounds. Murmurs - no Ext: Stigmata of Connective Tissue Disease - no but has edema HEENT: Normal upper airway. PEERL +. No post nasal drip        Assessment:       ICD-10-CM   1. Stage 4 very severe COPD by GOLD classification ([place])  J44.9   2. Chronic respiratory failure with hypoxia (HCC)  J96.11   3. COPD, frequent exacerbations ([place])  J44.1   4. Abnormal findings on diagnostic imaging of lung  R91.8   5. Pulmonary embolism, unspecified chronicity, unspecified pulmonary embolism type, unspecified whether acute cor pulmonale present (HCC)  I26.99   6. Vaccine counseling  Z71.85  Plan:     Patient Instructions     ICD-10-CM   1. Stage 4 very severe COPD by GOLD classification ([place])  J44.9   2. Chronic respiratory failure with hypoxia (HCC)  J96.11   3. COPD, frequent exacerbations ([place])  J44.1   4. Abnormal findings on diagnostic imaging of lung  R91.8   5. Pulmonary embolism, unspecified chronicity, unspecified pulmonary embolism type, unspecified whether acute cor pulmonale present (HCC)  I26.99   6. Vaccine counseling  Z71.85     Stable disease without recent exacerbation.  Glad recent GI bleeding has been resolved and glad you are doing better. CT scan shows improvement of pneumonia but there is concern about mucous or potential aspiration in the main airway Glad he had the flu shot and also Covid booster  Plan -Continue oxygen, Spiriva, Symbicort, Daliresp and 3 times weekly azithromycin and daily prednisone -Have a primary care physician check Covid IgG antibody to see if you had a response with a booster -Continue social distancing and masking -We will probably do a repeat chest x-ray at follow-up to monitor imaging issues -Continue anticoagulation -   Sleep with head end of the bed elevated and feet  Follow-up -3 months or sooner with CAT score at follow-up      SIGNATURE    Dr. Brand Males, M.D., F.C.C.P,  Pulmonary and Critical Care  Medicine Staff Physician, [place] Director - Interstitial Lung Disease  Program  [facility] [place], [place], [address]  Pager: [phone], If no answer or between  15:00h - 7:00h: call [phone] Telephone: [phone]  11:08 AM 05/03/2020

## 2020-05-03 NOTE — Patient Instructions (Addendum)
ICD-10-CM   1. Stage 4 very severe COPD by GOLD classification ([place])  J44.9   2. Chronic respiratory failure with hypoxia (HCC)  J96.11   3. COPD, frequent exacerbations ([place])  J44.1   4. Abnormal findings on diagnostic imaging of lung  R91.8   5. Pulmonary embolism, unspecified chronicity, unspecified pulmonary embolism type, unspecified whether acute cor pulmonale present (HCC)  I26.99   6. Vaccine counseling  Z71.85     Stable disease without recent exacerbation.  Glad recent GI bleeding has been resolved and glad you are doing better. CT scan shows improvement of pneumonia but there is concern about mucous or potential aspiration in the main airway Glad he had the flu shot and also Covid booster  Plan -Continue oxygen, Spiriva, Symbicort, Daliresp and 3 times weekly azithromycin and daily prednisone -Have a primary care physician check Covid IgG antibody to see if you had a response with a booster -Continue social distancing and masking -We will probably do a repeat chest x-ray at follow-up to monitor imaging issues -Continue anticoagulation -   Sleep with head end of the bed elevated and feet  Follow-up -3 months or sooner with CAT score at follow-up

## 2020-05-08 ENCOUNTER — Encounter: Payer: Self-pay | Admitting: Family Medicine

## 2020-05-11 ENCOUNTER — Ambulatory Visit (INDEPENDENT_AMBULATORY_CARE_PROVIDER_SITE_OTHER): Payer: Medicare Other | Admitting: Vascular Surgery

## 2020-05-11 ENCOUNTER — Encounter: Payer: Self-pay | Admitting: Vascular Surgery

## 2020-05-11 ENCOUNTER — Other Ambulatory Visit: Payer: Self-pay

## 2020-05-11 VITALS — BP 122/74 | HR 110 | Temp 98.0°F | Resp 20 | Ht 72.0 in | Wt 166.0 lb

## 2020-05-11 DIAGNOSIS — I739 Peripheral vascular disease, unspecified: Secondary | ICD-10-CM

## 2020-05-11 NOTE — Progress Notes (Signed)
Patient is a 74 year old male who returns for follow-up today.  He has a nonhealing wound of his left first toe.  There is dry eschar here.  He has been washing this daily with soap and water and Epson salt soaks.  He previously underwent right common femoral endarterectomy October 2020.  He underwent left femoral endarterectomy July 2021.  He has no drainage from the toe at this point.  He just finished a 2-week course of antibiotics.  He is chronically on Zithromax for his underlying COPD.  He is on home oxygen.  Physical exam:  Vitals:   05/11/20 0836  BP: 122/74  Pulse: (!) 110  Resp: 20  Temp: 98 F (36.7 C)  SpO2: 96%  Weight: 166 lb (75.3 kg)  Height: 6' (1.829 m)    Dry gangrene tip left first toe no surrounding drainage or erythema  Assessment: Slowly healing gangrenous tip left first toe high risk for limb loss in the long-term but currently this wound is stable hopefully will spontaneously heal.  Plan: Patient will follow up with me in 6 months time to recheck his foot.  He will follow-up sooner if he develops worsening symptoms.  Ruta Hinds, MD [facility] Office: [phone]

## 2020-05-17 ENCOUNTER — Encounter: Payer: Self-pay | Admitting: Family Medicine

## 2020-05-19 ENCOUNTER — Other Ambulatory Visit: Payer: Self-pay

## 2020-05-19 ENCOUNTER — Encounter: Payer: Self-pay | Admitting: Family Medicine

## 2020-05-19 ENCOUNTER — Telehealth: Payer: Self-pay

## 2020-05-19 ENCOUNTER — Ambulatory Visit (INDEPENDENT_AMBULATORY_CARE_PROVIDER_SITE_OTHER): Payer: Medicare Other | Admitting: Physician Assistant

## 2020-05-19 VITALS — BP 131/77 | HR 103 | Temp 98.4°F | Resp 18 | Ht 72.0 in | Wt 165.0 lb

## 2020-05-19 DIAGNOSIS — R600 Localized edema: Secondary | ICD-10-CM | POA: Diagnosis not present

## 2020-05-19 DIAGNOSIS — I739 Peripheral vascular disease, unspecified: Secondary | ICD-10-CM | POA: Diagnosis not present

## 2020-05-19 DIAGNOSIS — L089 Local infection of the skin and subcutaneous tissue, unspecified: Secondary | ICD-10-CM

## 2020-05-19 MED ORDER — CEPHALEXIN 500 MG PO CAPS: 500.0000 mg | ORAL_CAPSULE | Freq: Three times a day (TID) | ORAL | 0 refills | Status: DC

## 2020-05-19 NOTE — Progress Notes (Signed)
POST OPERATIVE OFFICE NOTE    CC:  Sore on back of leg   HPI:  This is a 74 y.o. male who is s/p left common femoral endarterectomy with left common iliac artery stent and left external iliac artery stent placement by [name] in July 2021.  Additionally, he had undergone right femoral endarterectomy in October 2020.  Today he reports a painful dark-blue colored skin lesion of the right posterior lower leg for about 3 days.  He alerted his primary care physician who rec: warm compresses.  When this did not improve, his wife phoned our office for evaluation.  The lesion is tender to touch and when he bends his knee.  He denies fever or chills.  He denies acute lower extremity pain.  His wife continues to care for his left 1st toe wound and this is stable.  He is compliant with Eliquis and statin. He has a history of COPD and is maintained on prednisone. He has thinning of his skin secondary to this.   ABIs  January 25, 2020 were:  ABI/TBIToday's ABIToday's TBIPrevious ABIPrevious TBI  +-------+-----------+-----------+------------+------------+  Right 0.94    0.54    0.90    0.53      +-------+-----------+-----------+------------+------------+  Left  1.03          0.29    0.00     Pedal waveforms were biphasic. Left toe pressures not obtained due to wound.  Allergies  Allergen Reactions  . Tape Other (See Comments)    SKIN IS VERY THIN, TEARS SKIN; CAN ONLY USE COBAN WRAPS DUE TO CONDITION OF SKIN!!  . Ciprofloxacin Nausea Only    Sick on stomach, weak/tired  . Levaquin [Levofloxacin] Other (See Comments)    hallucinations  . Lipitor [Atorvastatin] Rash    Current Outpatient Medications  Medication Sig Dispense Refill  . acetaminophen (TYLENOL) 500 MG tablet Take 1,000 mg by mouth every 6 (six) hours as needed for moderate pain.     [name] Kitchen albuterol (VENTOLIN HFA) 108 (90 Base) MCG/ACT inhaler USE 2 PUFFS EVERY 6 HOURS  AS NEEDED FOR  WHEEZING 18 g 3  . apixaban (ELIQUIS) 2.5 MG TABS tablet Take 1 tablet (2.5 mg total) by mouth 2 (two) times daily. 180 tablet 3  . azithromycin (ZITHROMAX) 250 MG tablet Take 1 tablet (250 mg total) by mouth every Monday, Wednesday, and Friday. 45 tablet 1  . budesonide-formoterol (SYMBICORT) 160-4.5 MCG/ACT inhaler Inhale 2 puffs into the lungs 2 (two) times daily.    . Calcium Carbonate-Vitamin D (CALCIUM 600+D PO) Take 2 tablets by mouth daily.    . cycloSPORINE (RESTASIS) 0.05 % ophthalmic emulsion Place 1 drop into both eyes 2 (two) times daily as needed (dry eyes).     . DALIRESP 500 MCG TABS tablet TAKE 1 TABLET BY MOUTH  DAILY 90 tablet 3  . fluticasone (FLONASE) 50 MCG/ACT nasal spray Place 2 sprays into both nostrils daily. 48 g 3  . folic acid (FOLVITE) 1 MG tablet Take 1 tablet (1 mg total) by mouth daily.    . furosemide (LASIX) 20 MG tablet TAKE 1 TABLET (20 MG TOTAL) BY MOUTH DAILY AS NEEDED FOR FLUID OR EDEMA. 90 tablet 3  . Multiple Vitamin (MULTIVITAMIN WITH MINERALS) TABS tablet Take 1 tablet by mouth daily.    . OXYGEN Inhale 4-5 L into the lungs continuous.     . pantoprazole (PROTONIX) 40 MG tablet Take 1 tablet (40 mg total) by mouth 2 (two) times daily. 180 tablet  3  . predniSONE (DELTASONE) 10 MG tablet Take 1 tablet (10 mg total) by mouth daily with breakfast. 90 tablet 0  . Respiratory Therapy Supplies (FLUTTER) DEVI 10 times Twice a day and prn as needed, may increase if feeling worse 1 each 0  . rosuvastatin (CRESTOR) 10 MG tablet Take 1 tablet (10 mg total) by mouth daily. 90 tablet 3  . sodium chloride HYPERTONIC 3 % nebulizer solution Take by nebulization daily. (Patient taking differently: Take 4 mLs by nebulization daily as needed (to loosen phlegm). ) 90 mL 12  . Tiotropium Bromide Monohydrate (SPIRIVA RESPIMAT) 2.5 MCG/ACT AERS USE 2 INHALATIONS BY MOUTH&nbsp;&nbsp;ONCE DAILY 12 g 3  . traMADol (ULTRAM) 50 MG tablet Take 1 tablet (50 mg total) by mouth every 12  (twelve) hours as needed for moderate pain or severe pain. 90 tablet 1   No current facility-administered medications for this visit.     ROS:  See HPI   Physical Exam: Vitals:   05/19/20 1322  BP: 131/77  Pulse: (!) 103  Resp: 18  Temp: 98.4 F (36.9 C)  SpO2: 95%   General appearance: chronically ill appearing in NAD Cardiac: RRR Respiratory: labored on chronic O2 supp Extremities:  Chronic venous insuff skin changes and edema. Left 1st toe ulcer dry Approximately 2cm x 2cm nodule of post calf. Tender without fluctuance. Mild surrounding erythema Neuro: alert and oriented x 4        Assessment/Plan:  This is a 74 y.o. male who is s/p:  Left common femoral endarterectomy with left CIA and EIA stent placement.  He presents today with painful left posterior calf skin lesion.  Will place him on Keflex TID for 7 days.  If no improvement, I have advised him to call PCP for follow-up.  Risa Grill, PA-C [facility] [phone]  Clinic MD:  Donzetta Matters

## 2020-05-19 NOTE — Telephone Encounter (Signed)
Patient reports what appeared as a boil, black and painful on the back on his right leg two days ago. The pain and appearance has since worsened with swelling, redness, warmth around spot. He denies any fever. Pain increases with movement and when straightening leg out. He is able to ambulate. He applied heat compress as recommended by PCP two days, which initially helped with discomfort. He denies any shortness or chest pain. Pt is taking Eliquis for h/o pulmonary embolism in 2019.   Discussed patients symptoms with Gerri Lins, PA-C who recommended to have patient come in for office visit and will additional studies if needed.   Informed patient of provider recommendation and appointment time today, arrive by 1:15pm. Pt voiced understanding.

## 2020-05-19 NOTE — Telephone Encounter (Signed)
Will respond on mychart

## 2020-05-19 NOTE — Telephone Encounter (Signed)
Nurse Assessment Nurse: Harvie Bridge, RN, Beth Date/Time (Eastern Time): 05/19/2020 8:33:39 AM Confirm and document reason for call. If symptomatic, describe symptoms. ---Benjie Karvonen from office is transferring wife of pt who has sent pics of his leg which is worse today visually. Dr. Lemmie Evens mentioned possibility of blood clot. Pt. takes Eliquis/has leg pain. Does the patient have any new or worsening symptoms? ---Yes Will a triage be completed? ---Yes Related visit to physician within the last 2 weeks? ---No Does the PT have any chronic conditions? (i.e. diabetes, asthma, this includes High risk factors for pregnancy, etc.) ---Yes List chronic conditions. ---COPD, Emphesema, Blood clots in legs Is this a behavioral health or substance abuse call? ---No Guidelines Guideline Title Affirmed Question Affirmed Notes Nurse Date/Time (Eastern Time) Leg Pain Unable to walk [employer], RN, [facility] 05/19/2020 8:35:15 AM Disp. Time Eilene Ghazi Time) Disposition Final User 05/19/2020 8:37:48 AM Go to ED Now Yes Newhart, RN, Beth Caller Disagree/Comply Disagree PLEASE NOTE: All timestamps contained within this report are represented as [place] Standard Time. CONFIDENTIALTY NOTICE: This fax transmission is intended only for the addressee. It contains information that is legally privileged, confidential or otherwise protected from use or disclosure. If you are not the intended recipient, you are strictly prohibited from reviewing, disclosing, copying using or disseminating any of this information or taking any action in reliance on or regarding this information. If you have received this fax in error, please notify us immediately by telephone so that we can arrange for its return to [place]. Phone: [phone], Toll-Free: [phone], Fax: [phone] Page: 2 of 2 Call Id: [id number] [place] Understands Yes PreDisposition InappropriateToAsk Care Advice Given Per Guideline GO TO ED NOW: * You need to be seen in the Emergency  Department. BRING MEDICINES: * Bring a list of your current medicines when you go to the Emergency Department (ER). CARE ADVICE given per Leg Pain (Adult) guideline. Comments User: Louretta Shorten, RN Date/Time Eilene Ghazi Time): 05/19/2020 8:39:43 AM Patient unable to walk or move leg without extreme pain. Refused ED outcome. States he will call his "Vein" doctor. But will not go to ED. Referrals GO TO FACILITY REFUSED

## 2020-05-22 ENCOUNTER — Telehealth: Payer: Self-pay | Admitting: Hospice

## 2020-05-22 NOTE — Telephone Encounter (Signed)
NP called and spoke with patient's spouse -Francisco [name] to inform NP on her way to scheduled visit at 12pm today. She conferred with patient and declined visit saying patient did not not sleep well last night and is resting/sleeping at this time. When offered to reschedule, she said she would call in the future when ready for a visit.

## 2020-05-25 ENCOUNTER — Other Ambulatory Visit: Payer: Self-pay | Admitting: *Deleted

## 2020-05-25 MED ORDER — ALBUTEROL SULFATE HFA 108 (90 BASE) MCG/ACT IN AERS
INHALATION_SPRAY | RESPIRATORY_TRACT | 3 refills | Status: DC
Start: 2020-05-25 — End: 2021-12-31

## 2020-05-29 ENCOUNTER — Encounter: Payer: Self-pay | Admitting: Family Medicine

## 2020-05-29 DIAGNOSIS — Z1152 Encounter for screening for COVID-19: Secondary | ICD-10-CM

## 2020-05-29 DIAGNOSIS — D649 Anemia, unspecified: Secondary | ICD-10-CM

## 2020-05-29 DIAGNOSIS — I10 Essential (primary) hypertension: Secondary | ICD-10-CM

## 2020-05-30 ENCOUNTER — Other Ambulatory Visit: Payer: Self-pay

## 2020-05-30 DIAGNOSIS — R82998 Other abnormal findings in urine: Secondary | ICD-10-CM

## 2020-06-05 ENCOUNTER — Other Ambulatory Visit: Payer: Self-pay | Admitting: Specialist

## 2020-06-05 DIAGNOSIS — M5459 Other low back pain: Secondary | ICD-10-CM

## 2020-06-06 ENCOUNTER — Encounter: Payer: Medicare Other | Admitting: Family Medicine

## 2020-06-06 ENCOUNTER — Other Ambulatory Visit: Payer: Self-pay

## 2020-06-06 ENCOUNTER — Ambulatory Visit
Admission: RE | Admit: 2020-06-06 | Discharge: 2020-06-06 | Disposition: A | Discharge status: Home / Self Care | Payer: Medicare Other | Source: Ambulatory Visit | Attending: Specialist | Admitting: Specialist

## 2020-06-06 DIAGNOSIS — M5459 Other low back pain: Secondary | ICD-10-CM

## 2020-06-06 IMAGING — MR MR LUMBAR SPINE W/O CM
4 of 5 series · 27 of 48 positions shown · non-contrast
Comparison: MRI of the lumbar spine from [DATE].

CLINICAL DATA: Severe low back pain after lifting a chair.
Bilateral leg numbness.

EXAM:
MRI LUMBAR SPINE WITHOUT CONTRAST
TECHNIQUE: Multiplanar, multisequence MR imaging of the lumbar spine was
performed. No intravenous contrast was administered.

[Series 3: T2 · sagittal · 4.0mm · 1.09mm/px · 6 of 17 slices shown (1 of 2)]
[im 1/17]
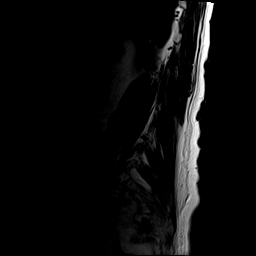
[im 4/17]
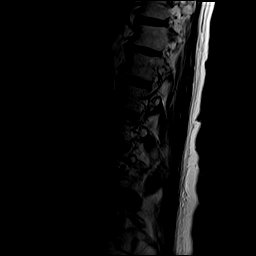
[im 7/17]
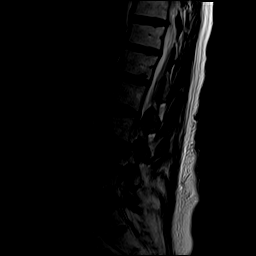
[im 10/17]
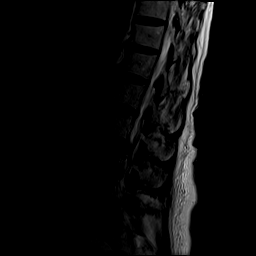
[im 13/17]
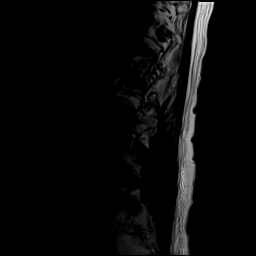
[im 17/17]
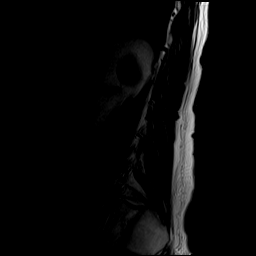

[Series 5: T1 · sagittal · 4.0mm · 1.09mm/px · 6 of 17 slices shown (1 of 2)]
[im 1/17]
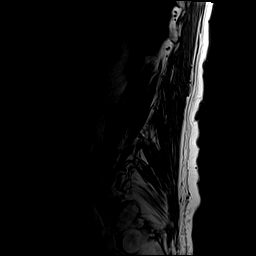
[im 4/17]
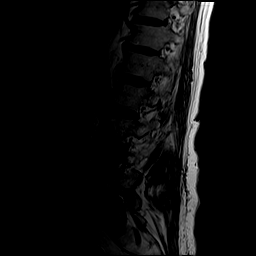
[im 7/17]
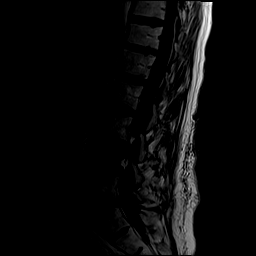
[im 10/17]
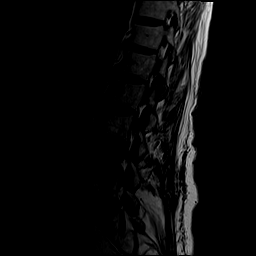
[im 13/17]
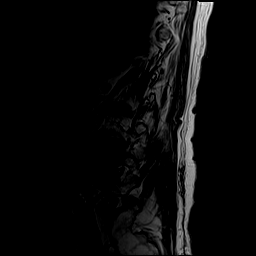
[im 17/17]
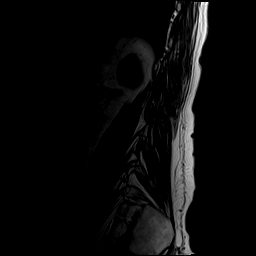

[Series 6: T2 · axial · 4.0mm · 0.39mm/px · z∈[-93,+125]mm · 9 of 43 slices shown (2 of 2)]
[im 1/43]
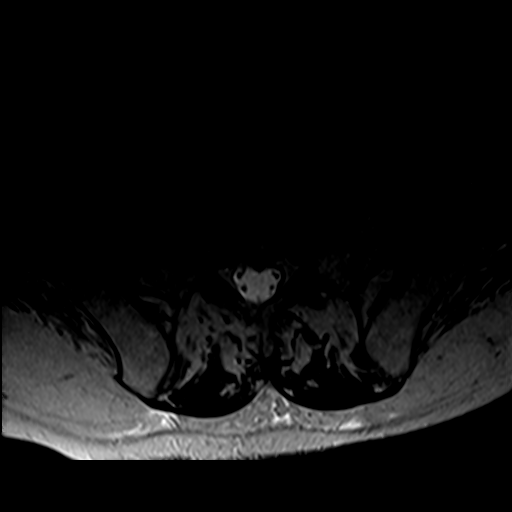
[im 7/43]
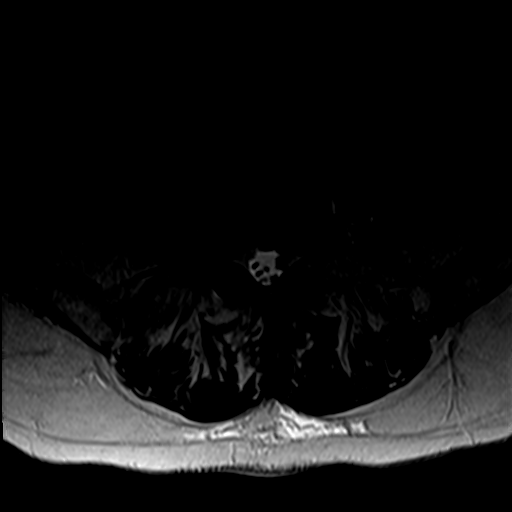
[im 13/43]
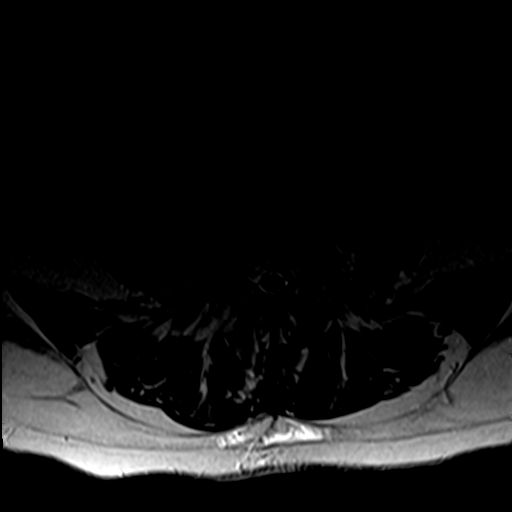
[im 19/43]
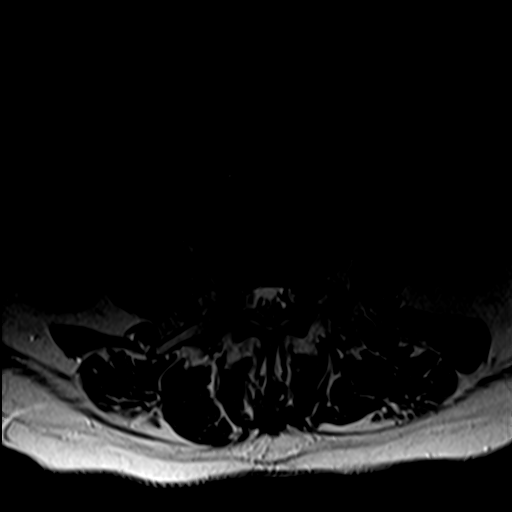
[im 22/43]
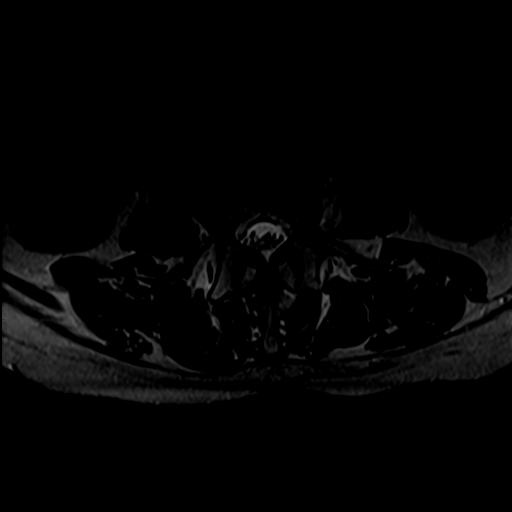
[im 25/43]
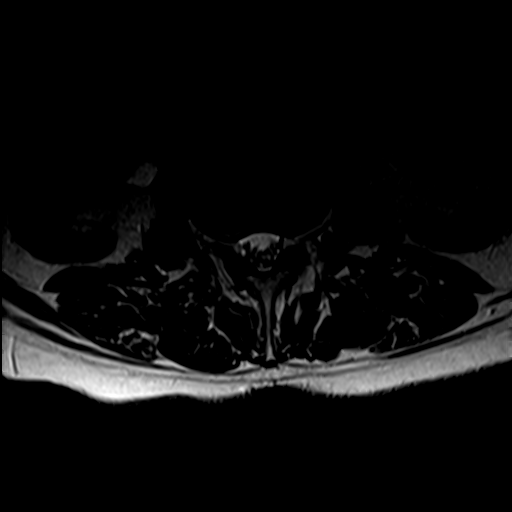
[im 31/43]
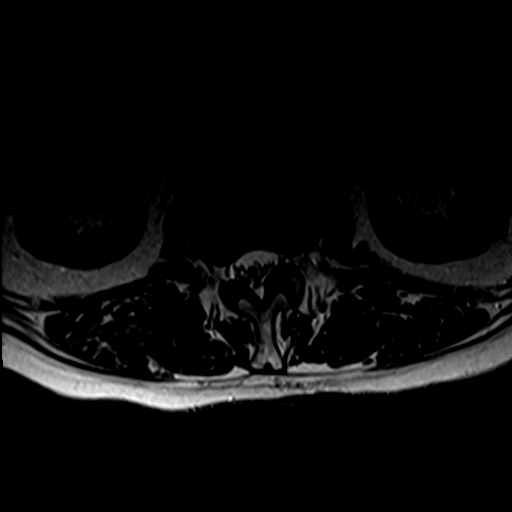
[im 37/43]
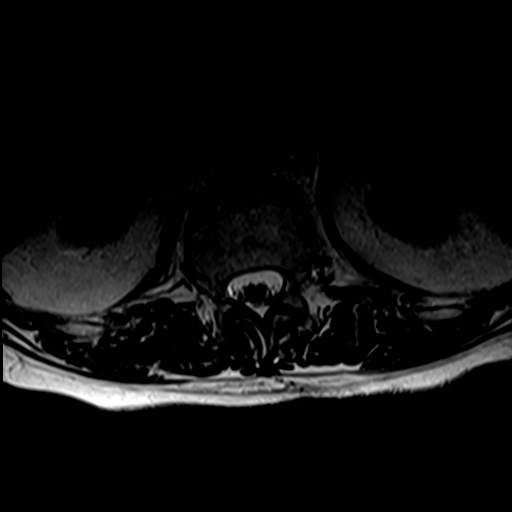
[im 43/43]
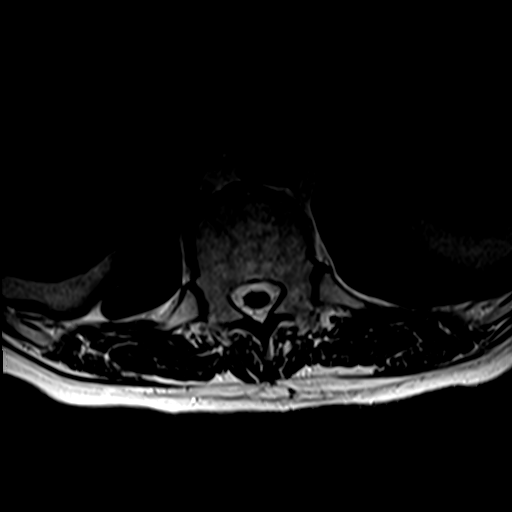

[Series 7: T1 · axial · 4.0mm · 0.39mm/px · z∈[-93,+96]mm · 6 of 43 slices shown (2 of 2)]
[im 1/43]
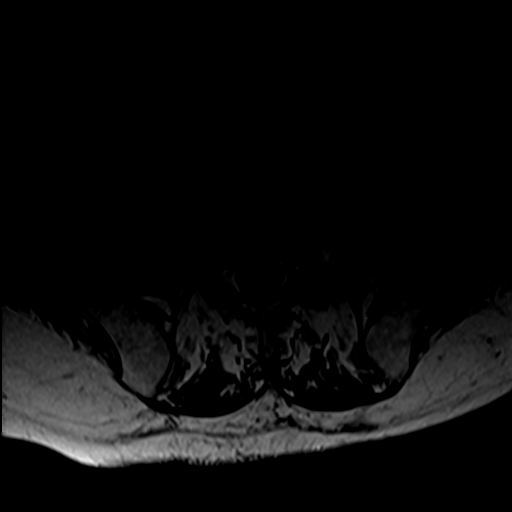
[im 7/43]
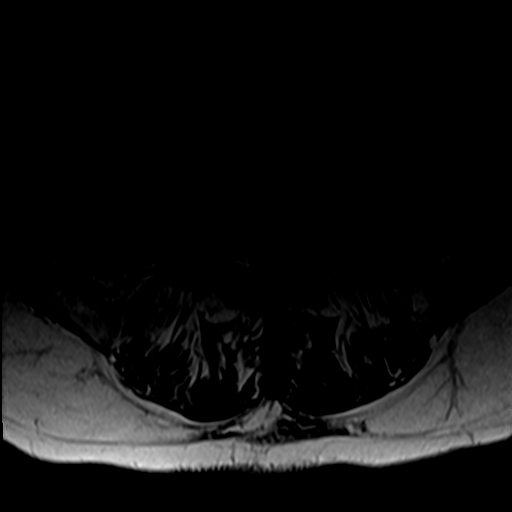
[im 13/43]
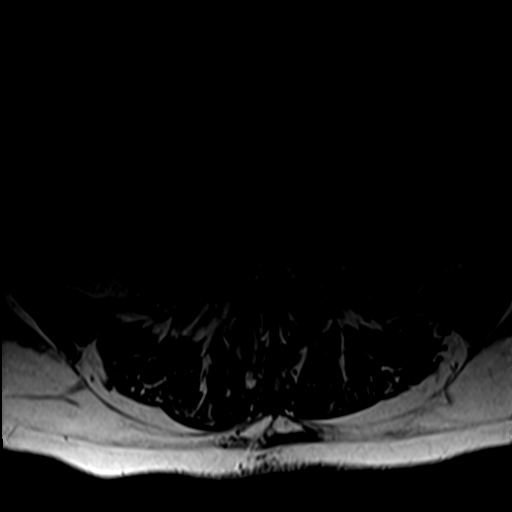
[im 19/43]
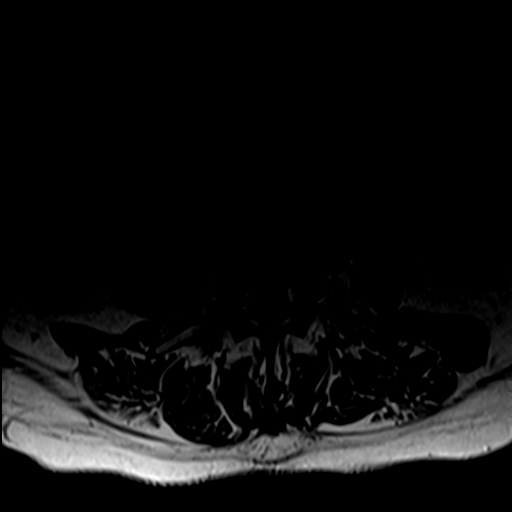
[im 22/43]
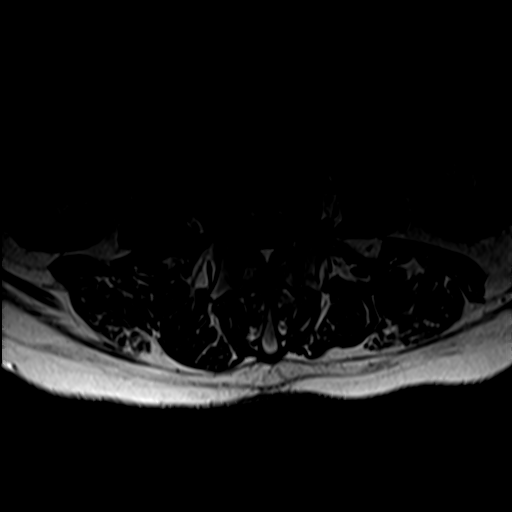
[im 37/43]
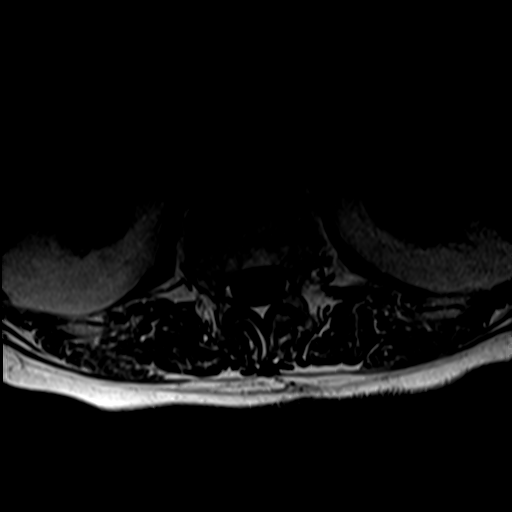

[27 of 48 positions shown; findings below may reference images not displayed]

FINDINGS: Segmentation: 5 lumbar type vertebral bodies. The inferior-most
fully formed intervertebral disc is labeled L5-S1.

Alignment:  Normal.

Vertebrae: There is an acute L4 compression fracture with associated
bone marrow edema. There is up to approximately 80% central height
loss. There is mild bony retropulsion with canal stenosis detailed
below.

Conus medullaris and cauda equina: Conus extends to the L1-L2 level.
Conus appears normal.

Paraspinal and other soft tissues: Unremarkable.

Disc levels:

T12-L1: No significant disc protrusion, foraminal stenosis, or canal
stenosis.

L1-L2: No significant disc protrusion, foraminal stenosis, or canal
stenosis.

L2-L3: Disc bulging without significant canal or foraminal stenosis.

L3-L4: Broad-based disc bulge and ligamentum flavum thickening.
Additionally, there is mild retropulsion of the superior aspect of
L4. There is resulting moderate canal stenosis. Mild bilateral
foraminal stenosis.

L4-L5: Broad disc bulge and bilateral facet hypertrophy. Moderate
bilateral foraminal stenosis. Mild central canal stenosis.

L5-S1: Broad disc bulge and bilateral facet hypertrophy. Mild right
foraminal stenosis. No significant canal stenosis.
IMPRESSION: Acute L4 compression fracture with approximately 80% central height
loss. Mild bony retropulsion superimposed on existing degenerative
changes results in moderate canal stenosis at this level and
moderate bilateral foraminal stenosis at L4-L5.

These results will be called to the ordering clinician or
representative by the Radiologist Assistant, and communication
documented in the PACS or [REDACTED].

## 2020-06-06 NOTE — Telephone Encounter (Signed)
Wife reports prior dark urine has resolved with doing more water compared to ginger ale.   Patient does still need labs as ordered by Dr. Jeanice Lim we are using [facility] so I cannot use the labs he already submitted-we will reorder these under [facility].  Patient also had discussions with pulmonology and was instructed to get antibody levels checked for COVID-19 vaccination after booster-we will order all 3 of these today   Wife will schedule lab visit for him

## 2020-06-25 DIAGNOSIS — J449 Chronic obstructive pulmonary disease, unspecified: Secondary | ICD-10-CM

## 2020-06-26 MED ORDER — CEFDINIR 300 MG PO CAPS: 300.0000 mg | ORAL_CAPSULE | Freq: Two times a day (BID) | ORAL | 0 refills | Status: DC

## 2020-06-26 MED ORDER — PREDNISONE 10 MG PO TABS: ORAL_TABLET | ORAL | 0 refills | Status: AC

## 2020-06-26 NOTE — Telephone Encounter (Signed)
He has grown psedumonoas in august 2021 H  Likely has copd flare up now  Plan - - there is a chance that the outpatient Rx can fail but given holidays approaching we should try  - sputum for culture gram stain and AFB smear  - Please take Take prednisone 40mg  once daily x 3 days, then 30mg  once daily x 3 days, then 20mg  once daily x 3 days, then prednisone 10mg  once daily basekube to continue   - omnicef 300mg  bid x 5 days (hold azithro during this tme(   Allergies  Allergen Reactions  . Tape Other (See Comments)    SKIN IS VERY THIN, TEARS SKIN; CAN ONLY USE COBAN WRAPS DUE TO CONDITION OF SKIN!!  . Ciprofloxacin Nausea Only    Sick on stomach, weak/tired  . Levaquin [Levofloxacin] Other (See Comments)    hallucinations  . Lipitor [Atorvastatin] Rash    Current Outpatient Medications:  .  acetaminophen (TYLENOL) 500 MG tablet, Take 1,000 mg by mouth every 6 (six) hours as needed for moderate pain. , Disp: , Rfl:  .  albuterol (VENTOLIN HFA) 108 (90 Base) MCG/ACT inhaler, USE 2 PUFFS EVERY 6 HOURS  AS NEEDED FOR WHEEZING, Disp: 18 g, Rfl: 3 .  apixaban (ELIQUIS) 2.5 MG TABS tablet, Take 1 tablet (2.5 mg total) by mouth 2 (two) times daily., Disp: 180 tablet, Rfl: 3 .  azithromycin (ZITHROMAX) 250 MG tablet, Take 1 tablet (250 mg total) by mouth every Monday, Wednesday, and Friday., Disp: 45 tablet, Rfl: 1 .  budesonide-formoterol (SYMBICORT) 160-4.5 MCG/ACT inhaler, Inhale 2 puffs into the lungs 2 (two) times daily., Disp: , Rfl:  .  Calcium Carbonate-Vitamin D (CALCIUM 600+D PO), Take 2 tablets by mouth daily., Disp: , Rfl:  .  cephALEXin (KEFLEX) 500 MG capsule, Take 1 capsule (500 mg total) by mouth 3 (three) times daily., Disp: 21 capsule, Rfl: 0 .  cycloSPORINE (RESTASIS) 0.05 % ophthalmic emulsion, Place 1 drop into both eyes 2 (two) times daily as needed (dry eyes). , Disp: , Rfl:  .  DALIRESP 500 MCG TABS tablet, TAKE 1 TABLET BY MOUTH  DAILY, Disp: 90 tablet, Rfl: 3 .   fluticasone (FLONASE) 50 MCG/ACT nasal spray, Place 2 sprays into both nostrils daily., Disp: 48 g, Rfl: 3 .  folic acid (FOLVITE) 1 MG tablet, Take 1 tablet (1 mg total) by mouth daily., Disp: , Rfl:  .  furosemide (LASIX) 20 MG tablet, TAKE 1 TABLET (20 MG TOTAL) BY MOUTH DAILY AS NEEDED FOR FLUID OR EDEMA., Disp: 90 tablet, Rfl: 3 .  Multiple Vitamin (MULTIVITAMIN WITH MINERALS) TABS tablet, Take 1 tablet by mouth daily., Disp: , Rfl:  .  OXYGEN, Inhale 4-5 L into the lungs continuous. , Disp: , Rfl:  .  pantoprazole (PROTONIX) 40 MG tablet, Take 1 tablet (40 mg total) by mouth 2 (two) times daily., Disp: 180 tablet, Rfl: 3 .  predniSONE (DELTASONE) 10 MG tablet, Take 1 tablet (10 mg total) by mouth daily with breakfast., Disp: 90 tablet, Rfl: 0 .  Respiratory Therapy Supplies (FLUTTER) DEVI, 10 times Twice a day and prn as needed, may increase if feeling worse, Disp: 1 each, Rfl: 0 .  rosuvastatin (CRESTOR) 10 MG tablet, Take 1 tablet (10 mg total) by mouth daily., Disp: 90 tablet, Rfl: 3 .  sodium chloride HYPERTONIC 3 % nebulizer solution, Take by nebulization daily. (Patient taking differently: Take 4 mLs by nebulization daily as needed (to loosen phlegm). ), Disp: 90  mL, Rfl: 12 .  Tiotropium Bromide Monohydrate (SPIRIVA RESPIMAT) 2.5 MCG/ACT AERS, USE 2 INHALATIONS BY MOUTH&nbsp;&nbsp;ONCE DAILY, Disp: 12 g, Rfl: 3 .  traMADol (ULTRAM) 50 MG tablet, Take 1 tablet (50 mg total) by mouth every 12 (twelve) hours as needed for moderate pain or severe pain., Disp: 90 tablet, Rfl: 1

## 2020-06-26 NOTE — Telephone Encounter (Signed)
Called spoke with patient.  Let him know Dr. Golden Pop recommendations.   He will come in tomorrow 06/27/20 to pick up sputum cups.   Pharmacy verified, instructions gone over with patient, voiced understanding. RX sent  Nothing further needed at this time.

## 2020-06-26 NOTE — Telephone Encounter (Signed)
Patient sent Email today  Called and spoke with patient.  3-5 days ago breathing got worse, congestions yellow-green sputum.   Primary Pulmonologist: Ramaswamy Last office visit and with whom: 05/03/20 with MR What do we see them for (pulmonary problems): stage 4 COPD Last OV assessment/plan:   Plan -Continue oxygen, Spiriva, Symbicort, Daliresp and 3 times weekly azithromycin and daily prednisone -Have a primary care physician check Covid IgG antibody to see if you had a response with a booster -Continue social distancing and masking -We will probably do a repeat chest x-ray at follow-up to monitor imaging issues -Continue anticoagulation -   Sleep with head end of the bed elevated and feet  Follow-up -3 months or sooner with CAT score at follow-up   Was appointment offered to patient (explain)?  Patient wants a televisit or an antibiotic sent in   Reason for call:  Patient's breathing has been getting worse over the last 3-5 days. Sitting still on 4L oxygen is 95-98% when he gets up and gets moving 88% on 4L. Patient also has some congestion yellow-green sputum. Denies fever, body aches, sore throat. Patient is full vaccinated and was boostered on 04/07/20.  Patient only taking OTC flonase and all his RX medications.   [name] please advise   Allergies  Allergen Reactions  . Tape Other (See Comments)    SKIN IS VERY THIN, TEARS SKIN; CAN ONLY USE COBAN WRAPS DUE TO CONDITION OF SKIN!!  . Ciprofloxacin Nausea Only    Sick on stomach, weak/tired  . Levaquin [Levofloxacin] Other (See Comments)    hallucinations  . Lipitor [Atorvastatin] Rash    Immunization History  Administered Date(s) Administered  . Fluad Quad(high Dose 65+) 03/16/2019, 03/10/2020  . Influenza Split 04/24/2012  . Influenza Whole 05/08/2009, 04/08/2011  . Influenza, High Dose Seasonal PF 05/01/2017, 03/31/2018  . Influenza,inj,Quad PF,6+ Mos 04/27/2013, 04/15/2014, 04/24/2015  .  Influenza,inj,quad, With Preservative 05/01/2018  . Influenza-Unspecified 05/13/2016  . PFIZER SARS-COV-2 Vaccination 07/30/2019, 08/20/2019, 04/07/2020  . Pneumococcal Conjugate-13 02/25/2014  . Pneumococcal Polysaccharide-23 08/15/2011, 12/19/2016  . Pneumococcal-Unspecified 03/08/2017  . Td 02/21/2010  . Tdap 11/05/2017  . Zoster 01/29/2012

## 2020-06-27 ENCOUNTER — Telehealth: Payer: Self-pay | Admitting: Internal Medicine

## 2020-06-27 NOTE — Telephone Encounter (Signed)
Called and spoke with pt letting him know that he could take the specimen cups to [facility] after samples are collected and he verbalized understanding. Nothing further needed.

## 2020-07-03 ENCOUNTER — Other Ambulatory Visit: Payer: Self-pay | Admitting: Internal Medicine

## 2020-07-03 ENCOUNTER — Other Ambulatory Visit: Payer: Medicare Other

## 2020-07-03 DIAGNOSIS — J449 Chronic obstructive pulmonary disease, unspecified: Secondary | ICD-10-CM

## 2020-07-06 ENCOUNTER — Other Ambulatory Visit (INDEPENDENT_AMBULATORY_CARE_PROVIDER_SITE_OTHER): Payer: Medicare Other

## 2020-07-06 ENCOUNTER — Encounter: Payer: Self-pay | Admitting: Vascular Surgery

## 2020-07-06 ENCOUNTER — Other Ambulatory Visit: Payer: Self-pay

## 2020-07-06 ENCOUNTER — Ambulatory Visit (INDEPENDENT_AMBULATORY_CARE_PROVIDER_SITE_OTHER): Payer: Medicare Other | Admitting: Vascular Surgery

## 2020-07-06 VITALS — BP 125/79 | HR 99 | Temp 97.7°F | Resp 18 | Ht 68.0 in | Wt 165.0 lb

## 2020-07-06 DIAGNOSIS — D649 Anemia, unspecified: Secondary | ICD-10-CM

## 2020-07-06 DIAGNOSIS — I739 Peripheral vascular disease, unspecified: Secondary | ICD-10-CM

## 2020-07-06 DIAGNOSIS — I10 Essential (primary) hypertension: Secondary | ICD-10-CM

## 2020-07-06 DIAGNOSIS — Z1152 Encounter for screening for COVID-19: Secondary | ICD-10-CM

## 2020-07-06 NOTE — Addendum Note (Signed)
Addended by: Adah Salvage F on: 07/06/2020 09:41 AM   Modules accepted: Orders

## 2020-07-06 NOTE — Progress Notes (Signed)
Patient is a 74 year old male who returns for follow-up today.  He has a nonhealing wound of his left first toe.  He previously underwent right common femoral endarterectomy October 2020.  He underwent left femoral endarterectomy July 2021.  He has fairly significant underlying pulmonary disability and is on home oxygen.  He is also chronically on Zithromax for his underlying COPD.  He returns today for further follow-up regarding the toe wound on the left foot.  He is currently washing the wound daily with soap and water and peroxide and then painting with Betadine.  He has not had any drainage redness or pain in the toe.  He has been further debilitated by multiple compression fractures in his spine which are causing him to require sleeping in a chair.  He overall feels fairly well.  Review of systems: He has shortness of breath with minimal exertion.  He does not have chest pain.  Current Outpatient Medications on File Prior to Visit  Medication Sig Dispense Refill  . acetaminophen (TYLENOL) 500 MG tablet Take 1,000 mg by mouth every 6 (six) hours as needed for moderate pain.     [name] Kitchen albuterol (VENTOLIN HFA) 108 (90 Base) MCG/ACT inhaler USE 2 PUFFS EVERY 6 HOURS  AS NEEDED FOR WHEEZING 18 g 3  . apixaban (ELIQUIS) 2.5 MG TABS tablet Take 1 tablet (2.5 mg total) by mouth 2 (two) times daily. 180 tablet 3  . azithromycin (ZITHROMAX) 250 MG tablet Take 1 tablet (250 mg total) by mouth every Monday, Wednesday, and Friday. 45 tablet 1  . budesonide-formoterol (SYMBICORT) 160-4.5 MCG/ACT inhaler Inhale 2 puffs into the lungs 2 (two) times daily.    . Calcium Carbonate-Vitamin D (CALCIUM 600+D PO) Take 2 tablets by mouth daily.    . cefdinir (OMNICEF) 300 MG capsule Take 1 capsule (300 mg total) by mouth 2 (two) times daily. 10 capsule 0  . cycloSPORINE (RESTASIS) 0.05 % ophthalmic emulsion Place 1 drop into both eyes 2 (two) times daily as needed (dry eyes).     . DALIRESP 500 MCG TABS tablet TAKE 1 TABLET  BY MOUTH  DAILY 90 tablet 3  . fluticasone (FLONASE) 50 MCG/ACT nasal spray Place 2 sprays into both nostrils daily. 48 g 3  . folic acid (FOLVITE) 1 MG tablet Take 1 tablet (1 mg total) by mouth daily.    . furosemide (LASIX) 20 MG tablet TAKE 1 TABLET (20 MG TOTAL) BY MOUTH DAILY AS NEEDED FOR FLUID OR EDEMA. 90 tablet 3  . HYDROcodone-acetaminophen (NORCO/VICODIN) 5-325 MG tablet hydrocodone 5 mg-acetaminophen 325 mg tablet  Take 1 tablet 3 times a day by oral route as needed for pain for 7 days.    . Multiple Vitamin (MULTIVITAMIN WITH MINERALS) TABS tablet Take 1 tablet by mouth daily.    . OXYGEN Inhale 4-5 L into the lungs continuous.     . pantoprazole (PROTONIX) 40 MG tablet Take 1 tablet (40 mg total) by mouth 2 (two) times daily. 180 tablet 3  . predniSONE (DELTASONE) 10 MG tablet Take 1 tablet (10 mg total) by mouth daily with breakfast. 90 tablet 0  . predniSONE (DELTASONE) 10 MG tablet Take 4 tablets (40 mg total) by mouth daily with breakfast for 3 days, THEN 3 tablets (30 mg total) daily with breakfast for 3 days, THEN 2 tablets (20 mg total) daily with breakfast for 3 days, THEN 1 tablet (10 mg total) daily with breakfast for 22 days. 49 tablet 0  . Respiratory Therapy Supplies (FLUTTER)  DEVI 10 times Twice a day and prn as needed, may increase if feeling worse 1 each 0  . rosuvastatin (CRESTOR) 10 MG tablet Take 1 tablet (10 mg total) by mouth daily. 90 tablet 3  . sodium chloride HYPERTONIC 3 % nebulizer solution Take by nebulization daily. (Patient taking differently: Take 4 mLs by nebulization daily as needed (to loosen phlegm).) 90 mL 12  . Tiotropium Bromide Monohydrate (SPIRIVA RESPIMAT) 2.5 MCG/ACT AERS USE 2 INHALATIONS BY MOUTH&nbsp;&nbsp;ONCE DAILY 12 g 3  . traMADol (ULTRAM) 50 MG tablet Take 1 tablet (50 mg total) by mouth every 12 (twelve) hours as needed for moderate pain or severe pain. 90 tablet 1  . cephALEXin (KEFLEX) 500 MG capsule Take 1 capsule (500 mg total) by  mouth 3 (three) times daily. (Patient not taking: Reported on 07/06/2020) 21 capsule 0   No current facility-administered medications on file prior to visit.    Past Medical History:  Diagnosis Date  . Chronic back pain    "mid and lower" (04/07/2018)  . Chronic rhinitis    -Sinus Ct 08/01/2009 >> Bilateral maxillary sinusitis with some mucosal thickeningin the sphenoid and frontal sinuses as well with air fluid levels present -chronic rhinitis flyer Aug 04, 2009  . Compressed spine fracture ([place])   . COPD (chronic obstructive pulmonary disease) [facility])    PFT's rec Jul 17, 2009  . Emphysema lung ([place])   . On home oxygen therapy    "3L; 24/7" (04/07/2018)  . Onychomycosis    Dr. Judi Cong  . Orthostatic hypotension    "since 10/2017" (04/07/2018)  . PAD (peripheral artery disease) ([place])   . Pneumonia    "twice in 1 year" (04/07/2018)  . Pulmonary embolism ([place]) 04/07/2018  . Skin cancer    "lips, face, ears, arms" (04/07/2018)  . Vertigo    "since ~ 02/2018" (04/07/2018)    Past Surgical History:  Procedure Laterality Date  . ABDOMINAL AORTOGRAM W/LOWER EXTREMITY N/A 01/21/2020   Procedure: ABDOMINAL AORTOGRAM W/LOWER EXTREMITY;  Surgeon: Elam Dutch, MD;  Location: [place] [facility];  Service: Cardiovascular;  Laterality: N/A;  . APPENDECTOMY    . BIOPSY  02/20/2020   Procedure: BIOPSY;  Surgeon: Rush Landmark Telford Nab., MD;  Location: [place];  Service: Gastroenterology;;  . CATARACT EXTRACTION, BILATERAL    . ENDARTERECTOMY FEMORAL Right 04/19/2019   Procedure: ENDARTERECTOMY RIGHT COMMON FEMORAL;  Surgeon: Elam Dutch, MD;  Location: [place];  Service: Vascular;  Laterality: Right;  . ENDARTERECTOMY FEMORAL Left 01/24/2020   Procedure: LEFT FEMORAL ENDARTERECTOMY WITH DACRON PATCH ANGIOPLASTY;  Surgeon: Elam Dutch, MD;  Location: [facility];  Service: Vascular;  Laterality: Left;  . ESOPHAGOGASTRODUODENOSCOPY (EGD) WITH PROPOFOL N/A 02/20/2020   Procedure:  ESOPHAGOGASTRODUODENOSCOPY (EGD) WITH PROPOFOL;  Surgeon: Irving Copas., MD;  Location: [place];  Service: Gastroenterology;  Laterality: N/A;  . FEMORAL ENDARTERECTOMY Left 01/24/2020  . HEMOSTASIS CLIP PLACEMENT  02/20/2020   Procedure: HEMOSTASIS CLIP PLACEMENT;  Surgeon: Irving Copas., MD;  Location: [place];  Service: Gastroenterology;;  . HOT HEMOSTASIS N/A 02/20/2020   Procedure: HOT HEMOSTASIS (ARGON PLASMA COAGULATION/BICAP);  Surgeon: Irving Copas., MD;  Location: [place];  Service: Gastroenterology;  Laterality: N/A;  . INSERTION OF ILIAC STENT Left 01/24/2020   Procedure: INSERTION OF VBX STENT 8X59 AND 8X39 LEFT COMMON ILIAC ARTERY. INSERTION OF INNOVA 7 X 60 INNOVA STENT LEFT EXTERNAL ILIAC ARTERY. ;  Surgeon: Elam Dutch, MD;  Location: [place];  Service: Vascular;  Laterality: Left;  . LOWER EXTREMITY ANGIOGRAPHY  12/11/2018  . LOWER EXTREMITY ANGIOGRAPHY N/A 12/11/2018   Procedure: LOWER EXTREMITY ANGIOGRAPHY;  Surgeon: Elam Dutch, MD;  Location: [facility];  Service: Cardiovascular;  Laterality: N/A;  . PATCH ANGIOPLASTY Right 04/19/2019   Procedure: Patch Angioplasty;  Surgeon: Elam Dutch, MD;  Location: [place];  Service: Vascular;  Laterality: Right;  . PERIPHERAL VASCULAR INTERVENTION Right 12/11/2018   Procedure: PERIPHERAL VASCULAR INTERVENTION;  Surgeon: Elam Dutch, MD;  Location: [place] [facility];  Service: Cardiovascular;  Laterality: Right;  Common Iliac   . SKIN CANCER EXCISION     "lips, face, ears, arms" (04/07/2018)  . TONSILLECTOMY    . ULTRASOUND GUIDANCE FOR VASCULAR ACCESS Right 01/24/2020   Procedure: ULTRASOUND GUIDANCE FOR VASCULAR ACCESS;  Surgeon: Elam Dutch, MD;  [facility];  Service: Vascular;  Laterality: Right;    Physical exam:  Vitals:   07/06/20 0845  BP: 125/79  Pulse: 99  Resp: 18  Temp: 97.7 F (36.5 C)  TempSrc: Temporal  SpO2: 97%  Weight: 165 lb (74.8  kg)  Height: 5\' 8"  (1.727 m)    Extremities: No palpable pedal pulses 1-2+ edema both feet dry eschar tip of left first toe with some separation around the edge overall appears to heal no erythema no drainage 3 x 2 cm diameter eschar  Assessment: Slowly healing wound left first toe still very high this for limb loss.  Hopefully this will continue to heal over time.  Plan: The patient will follow up in 6 weeks with repeat ABIs.  Ruta Hinds, MD [facility] Office: [phone]

## 2020-07-10 LAB — RESPIRATORY CULTURE OR RESPIRATORY AND SPUTUM CULTURE
MICRO NUMBER:: 11358042
SPECIMEN QUALITY:: ADEQUATE

## 2020-07-10 LAB — CBC WITH DIFFERENTIAL/PLATELET
Absolute Monocytes: 1235 cells/uL — ABNORMAL HIGH (ref 200–950)
Basophils Absolute: 69 cells/uL (ref 0–200)
Basophils Relative: 0.7 %
Eosinophils Absolute: 157 cells/uL (ref 15–500)
Eosinophils Relative: 1.6 %
HCT: 34.2 % — ABNORMAL LOW (ref 38.5–50.0)
Hemoglobin: 10.8 g/dL — ABNORMAL LOW (ref 13.2–17.1)
Lymphs Abs: 1421 cells/uL (ref 850–3900)
MCH: 26 pg — ABNORMAL LOW (ref 27.0–33.0)
MCHC: 31.6 g/dL — ABNORMAL LOW (ref 32.0–36.0)
MCV: 82.2 fL (ref 80.0–100.0)
MPV: 8.5 fL (ref 7.5–12.5)
Monocytes Relative: 12.6 %
Neutro Abs: 6919 cells/uL (ref 1500–7800)
Neutrophils Relative %: 70.6 %
Platelets: 262 10*3/uL (ref 140–400)
RBC: 4.16 10*6/uL — ABNORMAL LOW (ref 4.20–5.80)
RDW: 17 % — ABNORMAL HIGH (ref 11.0–15.0)
Total Lymphocyte: 14.5 %
WBC: 9.8 10*3/uL (ref 3.8–10.8)

## 2020-07-10 LAB — COMPLETE METABOLIC PANEL WITH GFR
AG Ratio: 1.7 (calc) (ref 1.0–2.5)
ALT: 17 U/L (ref 9–46)
AST: 21 U/L (ref 10–35)
Albumin: 4.2 g/dL (ref 3.6–5.1)
Alkaline phosphatase (APISO): 57 U/L (ref 35–144)
BUN/Creatinine Ratio: 24 (calc) — ABNORMAL HIGH (ref 6–22)
BUN: 27 mg/dL — ABNORMAL HIGH (ref 7–25)
CO2: 33 mmol/L — ABNORMAL HIGH (ref 20–32)
Calcium: 9.2 mg/dL (ref 8.6–10.3)
Chloride: 97 mmol/L — ABNORMAL LOW (ref 98–110)
Creat: 1.14 mg/dL (ref 0.70–1.18)
GFR, Est African American: 74 mL/min/{1.73_m2} (ref >=60)
GFR, Est Non African American: 64 mL/min/{1.73_m2} (ref >=60)
Globulin: 2.5 g/dL (calc) (ref 1.9–3.7)
Glucose, Bld: 95 mg/dL (ref 65–99)
Potassium: 4.5 mmol/L (ref 3.5–5.3)
Sodium: 139 mmol/L (ref 135–146)
Total Bilirubin: 0.5 mg/dL (ref 0.2–1.2)
Total Protein: 6.7 g/dL (ref 6.1–8.1)

## 2020-07-10 LAB — IRON,TIBC AND FERRITIN PANEL
%SAT: 6 % (calc) — ABNORMAL LOW (ref 20–48)
Ferritin: 18 ng/mL — ABNORMAL LOW (ref 24–380)
Iron: 29 ug/dL — ABNORMAL LOW (ref 50–180)
TIBC: 455 mcg/dL (calc) — ABNORMAL HIGH (ref 250–425)

## 2020-07-10 LAB — SARS COV-2 SEROLOGY(COVID-19)AB(IGG,IGM),IMMUNOASSAY
SARS CoV-2 AB IgG: NEGATIVE
SARS CoV-2 IgM: NEGATIVE

## 2020-07-11 ENCOUNTER — Other Ambulatory Visit: Payer: Self-pay

## 2020-07-11 DIAGNOSIS — I739 Peripheral vascular disease, unspecified: Secondary | ICD-10-CM

## 2020-07-14 ENCOUNTER — Telehealth: Payer: Self-pay | Admitting: Internal Medicine

## 2020-07-14 MED ORDER — FLUCONAZOLE 100 MG PO TABS: ORAL_TABLET | ORAL | 0 refills | Status: DC

## 2020-07-14 NOTE — Telephone Encounter (Signed)
Called and spoke with pt letting  Him know the info stated by MR and he verbalized understanding. Rx for fluconazole has been sent to pharmacy for pt.  Pt stated that he feels like he needs to have another round of abx and prednisone due to still having problems with coughing. MR, please advise if you are okay with this being sent in for pt.

## 2020-07-14 NOTE — Telephone Encounter (Signed)
Growing pseudomonas again - if he is better from recent pred/omnicef - can just watch from home  Also growing candida - could jus be oral thrush but given fact h is on steroids -> take fluconazole 200 mg loading dose, followed by 100 mg daily for 7days

## 2020-07-17 MED ORDER — CIPROFLOXACIN HCL 750 MG PO TABS: 750.0000 mg | ORAL_TABLET | Freq: Two times a day (BID) | ORAL | 0 refills | Status: DC

## 2020-07-17 MED ORDER — ONDANSETRON HCL 4 MG PO TABS: 4.0000 mg | ORAL_TABLET | Freq: Two times a day (BID) | ORAL | 0 refills | Status: DC | PRN

## 2020-07-17 NOTE — Telephone Encounter (Signed)
  He needs something for pseudomonas but cipro is listed as allergy and nause  Can he take cipro 750mg  bid x 12 days with zofran 4mg  bid prn? If so please send it Otherwise, is challenging - he will need IV abx etc    SIGNATURE    Dr. Brand Males, M.D., F.C.C.P,  Pulmonary and Critical Care Medicine Staff Physician, [place] Director - Interstitial Lung Disease  Program  Pulmonary [place] at [place], [place], [address]  Pager: [phone], If no answer  OR between  19:00-7:00h: page [phone] Telephone (clinical office): [phone] Telephone (research): [phone]  7:53 AM 07/17/2020    Allergies  Allergen Reactions  . Tape Other (See Comments)    SKIN IS VERY THIN, TEARS SKIN; CAN ONLY USE COBAN WRAPS DUE TO CONDITION OF SKIN!!  . Ciprofloxacin Nausea Only    Sick on stomach, weak/tired  . Levaquin [Levofloxacin] Other (See Comments)    hallucinations  . Lipitor [Atorvastatin] Rash

## 2020-07-17 NOTE — Telephone Encounter (Signed)
Please advise on patient mychart message  When your nurse called Friday afternoon about the  fluconazole medicine you wanted me to take I told her I never got better from the previous round of antibiotics and prednisone for congestion.  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   Usually you give me about round antibiotics and prednisone.  I do know at one time I was on Cephalexin 500mg . (keflex)  1 cap. 3 x daily seemed to help.  Please let me hear from you.

## 2020-07-17 NOTE — Telephone Encounter (Signed)
Spoke with the pt and notified of response per MR  Pt verbalized understanding  He states willing to try and see how he does on the cipro with taking zofran as needed  Rxs were sent to pharm and he is to call with any issues

## 2020-07-21 NOTE — Telephone Encounter (Signed)
I think this has been addressed. If so please close the note

## 2020-07-31 LAB — FUNGUS CULTURE W SMEAR
MICRO NUMBER:: 11358041
SMEAR:: NONE SEEN
SPECIMEN QUALITY:: ADEQUATE

## 2020-08-01 ENCOUNTER — Encounter: Payer: Medicare Other | Admitting: Family Medicine

## 2020-08-15 ENCOUNTER — Ambulatory Visit (INDEPENDENT_AMBULATORY_CARE_PROVIDER_SITE_OTHER): Payer: Medicare Other | Admitting: Gastroenterology

## 2020-08-15 ENCOUNTER — Encounter: Payer: Self-pay | Admitting: Gastroenterology

## 2020-08-15 ENCOUNTER — Other Ambulatory Visit (INDEPENDENT_AMBULATORY_CARE_PROVIDER_SITE_OTHER): Payer: Medicare Other

## 2020-08-15 VITALS — BP 130/78 | HR 98 | Ht 68.0 in | Wt 176.4 lb

## 2020-08-15 DIAGNOSIS — Z862 Personal history of diseases of the blood and blood-forming organs and certain disorders involving the immune mechanism: Secondary | ICD-10-CM

## 2020-08-15 DIAGNOSIS — Z7901 Long term (current) use of anticoagulants: Secondary | ICD-10-CM

## 2020-08-15 DIAGNOSIS — Z86010 Personal history of colonic polyps: Secondary | ICD-10-CM

## 2020-08-15 DIAGNOSIS — R69 Illness, unspecified: Secondary | ICD-10-CM | POA: Diagnosis not present

## 2020-08-15 DIAGNOSIS — Z8774 Personal history of (corrected) congenital malformations of heart and circulatory system: Secondary | ICD-10-CM

## 2020-08-15 LAB — CBC
HCT: 36 % — ABNORMAL LOW (ref 39.0–52.0)
Hemoglobin: 11.7 g/dL — ABNORMAL LOW (ref 13.0–17.0)
MCHC: 32.7 g/dL (ref 30.0–36.0)
MCV: 86.8 fl (ref 78.0–100.0)
Platelets: 270 10*3/uL (ref 150.0–400.0)
RBC: 4.14 Mil/uL — ABNORMAL LOW (ref 4.22–5.81)
RDW: 20.1 % — ABNORMAL HIGH (ref 11.5–15.5)
WBC: 9.8 10*3/uL (ref 4.0–10.5)

## 2020-08-15 LAB — IBC + FERRITIN
Ferritin: 36.4 ng/mL (ref 22.0–322.0)
Iron: 58 ug/dL (ref 42–165)
Saturation Ratios: 13 % — ABNORMAL LOW (ref 20.0–50.0)
Transferrin: 318 mg/dL (ref 212.0–360.0)

## 2020-08-15 NOTE — Progress Notes (Addendum)
[place] VISIT   Primary Care Provider [name], [place] [address] [phone]  Patient Profile: Francisco [name] is a 75 y.o. male with a pmh significant for COPD (on home O2), PAD (on Eliquis), CHF, prior PE, hyperlipidemia, colon polyps, duodenal AVMs.  The patient presents to the [facility] for an evaluation and management of problem(s) noted below:  Problem List 1. History of anemia   2. H/O arteriovenous malformation (AVM)   3. History of colonic polyps   4. Severe comorbid illness   5. Chronic anticoagulation     History of Present Illness Please see initial consultation note and prior progress notes for full details of HPI.  Interval History Today, the patient returns for follow-up and is accompanied by his wife.  Laboratories were not obtained as had initially been the plan.  However, the patient has been experiencing some increased issues in regards to a recent fall with recurrent fractures of the lumbar spine.  He continues to have follow-up with [name] for his peripheral arterial disease and is hopeful that he will not need a amputation.  His lung status remains significant with his advanced COPD but overall things are stable from that standpoint.  He and his wife remain unsure about further endoscopic procedures unless they remain absolutely necessary though they understand and recall our prior discussion in regards to his previous colonoscopies and prior endoscopic evaluation in the hospital.  He remains on PPI therapy.  He describes no significant changes in his bowel movements or bowel habits.  He is not noting any overt blood (hematochezia/maroon stools) but his stool is dark due to the iron.  GI Review of Systems Positive as above Negative for dysphagia, pyrosis, odynophagia, vomiting, nausea, pain, change in bowel habits   Review of Systems General: Denies  fevers/chills/unintentional weight loss Cardiovascular: Denies chest pain Pulmonary: Shortness of breath at baseline Gastroenterological: See HPI Genitourinary: Denies darkened urine or hematuria Hematological: Positive for easy bruising/bleeding due to anticoagulation Dermatological: Denies jaundice Psychological: Mood is stable Musculoskeletal: Back pain as result of his recent fractures from fall   Medications Current Outpatient Medications  Medication Sig Dispense Refill   acetaminophen (TYLENOL) 500 MG tablet Take 1,000 mg by mouth every 6 (six) hours as needed for moderate pain.      albuterol (VENTOLIN HFA) 108 (90 Base) MCG/ACT inhaler USE 2 PUFFS EVERY 6 HOURS  AS NEEDED FOR WHEEZING 18 g 3   apixaban (ELIQUIS) 2.5 MG TABS tablet Take 1 tablet (2.5 mg total) by mouth 2 (two) times daily. 180 tablet 3   azithromycin (ZITHROMAX) 250 MG tablet Take 1 tablet (250 mg total) by mouth every Monday, Wednesday, and Friday. 45 tablet 1   budesonide-formoterol (SYMBICORT) 160-4.5 MCG/ACT inhaler Inhale 2 puffs into the lungs 2 (two) times daily.     Calcium Carbonate-Vitamin D (CALCIUM 600+D PO) Take 2 tablets by mouth daily.     cycloSPORINE (RESTASIS) 0.05 % ophthalmic emulsion Place 1 drop into both eyes 2 (two) times daily as needed (dry eyes).      DALIRESP 500 MCG TABS tablet TAKE 1 TABLET BY MOUTH  DAILY 90 tablet 3   Ferrous Sulfate (IRON) 325 (65 Fe) MG TABS Take 1 tablet by mouth daily.     fluticasone (FLONASE) 50 MCG/ACT nasal spray Place 2 sprays into both nostrils daily. 48 g 3   folic acid (FOLVITE) 1 MG tablet Take 1 tablet (1 mg total)  by mouth daily.     furosemide (LASIX) 20 MG tablet TAKE 1 TABLET (20 MG TOTAL) BY MOUTH DAILY AS NEEDED FOR FLUID OR EDEMA. 90 tablet 3   HYDROcodone-acetaminophen (NORCO/VICODIN) 5-325 MG tablet Take 2 tablets by mouth as needed.     Multiple Vitamin (MULTIVITAMIN WITH MINERALS) TABS tablet Take 1 tablet by mouth daily.      OXYGEN Inhale 4-5 L into the lungs continuous.      pantoprazole (PROTONIX) 20 MG tablet Take 20 mg by mouth daily.     predniSONE (DELTASONE) 10 MG tablet Take 1 tablet (10 mg total) by mouth daily with breakfast. 90 tablet 0   Respiratory Therapy Supplies (FLUTTER) DEVI 10 times Twice a day and prn as needed, may increase if feeling worse 1 each 0   rosuvastatin (CRESTOR) 10 MG tablet Take 1 tablet (10 mg total) by mouth daily. 90 tablet 3   sodium chloride HYPERTONIC 3 % nebulizer solution Take by nebulization daily. (Patient taking differently: Take 4 mLs by nebulization daily as needed (to loosen phlegm).) 90 mL 12   Tiotropium Bromide Monohydrate (SPIRIVA RESPIMAT) 2.5 MCG/ACT AERS USE 2 INHALATIONS BY MOUTH  ONCE DAILY 12 g 3   traMADol (ULTRAM) 50 MG tablet Take 1 tablet (50 mg total) by mouth every 12 (twelve) hours as needed for moderate pain or severe pain. 90 tablet 1   No current facility-administered medications for this visit.    Allergies Allergies  Allergen Reactions   Tape Other (See Comments)    SKIN IS VERY THIN, TEARS SKIN; CAN ONLY USE COBAN WRAPS DUE TO CONDITION OF SKIN!!   Ciprofloxacin Nausea Only    Sick on stomach, weak/tired   Levaquin [Levofloxacin] Other (See Comments)    hallucinations   Lipitor [Atorvastatin] Rash    Histories Past Medical History:  Diagnosis Date   Chronic back pain    "mid and lower" (04/07/2018)   Chronic rhinitis    -Sinus Ct 08/01/2009 >> Bilateral maxillary sinusitis with some mucosal thickeningin the sphenoid and frontal sinuses as well with air fluid levels present -chronic rhinitis flyer Aug 04, 2009   Compressed spine fracture [facility])    COPD (chronic obstructive pulmonary disease) (HCC)    PFT's rec Jul 17, 2009   Emphysema lung [facility])    On home oxygen therapy    "3L; 24/7" (04/07/2018)   Onychomycosis    Dr. Judi Cong   Orthostatic hypotension    "since 10/2017" (04/07/2018)   PAD (peripheral artery  disease) ([place])    Pneumonia    "twice in 1 year" (04/07/2018)   Pulmonary embolism ([place]) 04/07/2018   Skin cancer    "lips, face, ears, arms" (04/07/2018)   Vertigo    "since ~ 02/2018" (04/07/2018)   Past Surgical History:  Procedure Laterality Date   ABDOMINAL AORTOGRAM W/LOWER EXTREMITY N/A 01/21/2020   Procedure: ABDOMINAL AORTOGRAM W/LOWER EXTREMITY;  Surgeon: Elam Dutch, MD;  Location: [facility];  Service: Cardiovascular;  Laterality: N/A;   APPENDECTOMY     BIOPSY  02/20/2020   Procedure: BIOPSY;  Surgeon: Rush Landmark Telford Nab., MD;  Location: [facility];  Service: Gastroenterology;;   CATARACT EXTRACTION, BILATERAL     ENDARTERECTOMY FEMORAL Right 04/19/2019   Procedure: ENDARTERECTOMY RIGHT COMMON FEMORAL;  Surgeon: Elam Dutch, MD;  Location: [facility] OR;  Service: Vascular;  Laterality: Right;   ENDARTERECTOMY FEMORAL Left 01/24/2020   Procedure: LEFT FEMORAL ENDARTERECTOMY WITH DACRON PATCH ANGIOPLASTY;  Surgeon: Elam Dutch, MD;  Location: [facility];  Service: Vascular;  Laterality: Left;   ESOPHAGOGASTRODUODENOSCOPY (EGD) WITH PROPOFOL N/A 02/20/2020   Procedure: ESOPHAGOGASTRODUODENOSCOPY (EGD) WITH PROPOFOL;  Surgeon: Rush Landmark Telford Nab., MD;  Location: [facility];  Service: Gastroenterology;  Laterality: N/A;   FEMORAL ENDARTERECTOMY Left 01/24/2020   HEMOSTASIS CLIP PLACEMENT  02/20/2020   Procedure: HEMOSTASIS CLIP PLACEMENT;  Surgeon: Irving Copas., MD;  Location: [facility];  Service: Gastroenterology;;   HOT HEMOSTASIS N/A 02/20/2020   Procedure: HOT HEMOSTASIS (ARGON PLASMA COAGULATION/BICAP);  Surgeon: Irving Copas., MD;  Location: [facility];  Service: Gastroenterology;  Laterality: N/A;   INSERTION OF ILIAC STENT Left 01/24/2020   Procedure: INSERTION OF VBX STENT 8X59 AND 8X39 LEFT COMMON ILIAC ARTERY. INSERTION OF INNOVA 7 X 60 INNOVA STENT LEFT EXTERNAL ILIAC ARTERY. ;  Surgeon: Elam Dutch, MD;   Location: [facility];  Service: Vascular;  Laterality: Left;   LOWER EXTREMITY ANGIOGRAPHY  12/11/2018   LOWER EXTREMITY ANGIOGRAPHY N/A 12/11/2018   Procedure: LOWER EXTREMITY ANGIOGRAPHY;  Surgeon: Elam Dutch, MD;  Location: [facility] [facility];  Service: Cardiovascular;  Laterality: N/A;   PATCH ANGIOPLASTY Right 04/19/2019   Procedure: Patch Angioplasty;  Surgeon: Elam Dutch, MD;  Location: [facility];  Service: Vascular;  Laterality: Right;   PERIPHERAL VASCULAR INTERVENTION Right 12/11/2018   Procedure: PERIPHERAL VASCULAR INTERVENTION;  Surgeon: Elam Dutch, MD;  Location: [facility];  Service: Cardiovascular;  Laterality: Right;  Common Iliac    SKIN CANCER EXCISION     "lips, face, ears, arms" (04/07/2018)   TONSILLECTOMY     ULTRASOUND GUIDANCE FOR VASCULAR ACCESS Right 01/24/2020   Procedure: ULTRASOUND GUIDANCE FOR VASCULAR ACCESS;  Surgeon: Elam Dutch, MD;  Location: [facility] OR;  Service: Vascular;  Laterality: Right;   Social History   Socioeconomic History   Marital status: Married    Spouse name: Not on file   Number of children: 2   Years of education: 3 years of college   Highest education level: Not on file  Occupational History    Employer: [employer]  Tobacco Use   Smoking status: Former Smoker    Packs/day: 2.00    Years: 52.00    Pack years: 104.00    Types: Pipe, Cigarettes    Start date: 10/06/1965    Quit date: 10/06/2017    Years since quitting: 2.8   Smokeless tobacco: Never Used   Tobacco comment: 04/07/2018 "smoked cigarettes years ago; stopped ~ 20 yr ago; stopped smoking pipe in 09/2017"  Vaping Use   Vaping Use: Never used  Substance and Sexual Activity   Alcohol use: Yes    Alcohol/week: 42.0 standard drinks    Types: 42 Cans of beer per week   Drug use: No   Sexual activity: Not Currently  Other Topics Concern   Not on file  Social History Narrative   Married (wife Katy Apo patient of Dr. Yong Channel) with children  (2-son and daughter). 1 grandson.       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       Hobbies: fishing, projects at home   Social Determinants of Radio broadcast assistant Strain: Not on file  Food Insecurity: Not on file  Transportation Needs: Not on file  Physical Activity: Not on file  Stress: Not on file  Social Connections: Not on file  Intimate Partner Violence: Not on file   Family History  Problem Relation Age of Onset  Heart disease Mother        CABG in her 70s, nonsmoker   Cancer Mother    Stroke Father    Heart disease Father        Died of MI at age 77, smoker   Hepatitis Sister    Coronary artery disease Other        male 1st degree relative <60   Colon cancer Neg Hx    Pancreatic cancer Neg Hx    Esophageal cancer Neg Hx    Inflammatory bowel disease Neg Hx    Liver disease Neg Hx    Rectal cancer Neg Hx    Stomach cancer Neg Hx    I have reviewed his medical, social, and family history in detail and updated the electronic medical record as necessary.    PHYSICAL EXAMINATION  BP 130/78    Pulse 98    Ht 5\' 8"  (1.727 m)    Wt 176 lb 6.4 oz (80 kg)    SpO2 98% Comment: O2 @ 3L   BMI 26.82 kg/m  Wt Readings from Last 3 Encounters:  08/15/20 176 lb 6.4 oz (80 kg)  07/06/20 165 lb (74.8 kg)  05/19/20 165 lb (74.8 kg)  GEN: NAD, appears older than stated age, appears chronically ill but nontoxic, accompanied by wife PSYCH: Cooperative, without pressured speech EYE: Conjunctivae pink, sclerae anicteric ENT: Masked but nasal trumpet in place CV: Nontachycardic RESP: Rales and wheezing present GI: NABS, soft, NT/ND, without rebound or guarding MSK/EXT: Bilateral lower extremity edema present SKIN: No jaundice NEURO:  Alert & Oriented x 3, no focal deficits   REVIEW OF DATA  I reviewed the following data at the time of this encounter:  GI Procedures and Studies  Previously reviewed  Laboratory  Studies  Reviewed those in epic  Imaging Studies  No relevant studies to review   ASSESSMENT  Mr. Ibe is a 75 y.o. male with a pmh significant for COPD (on home O2), PAD (on Eliquis), CHF, prior PE, hyperlipidemia, colon polyps, duodenal AVMs.  The patient is seen today for evaluation and management of:  1. History of anemia   2. H/O arteriovenous malformation (AVM)   3. History of colonic polyps   4. Severe comorbid illness   5. Chronic anticoagulation    The patient is clinically and hemodynamically stable at this time.  He seems to still be doing well since our last evaluation.  We will repeat his laboratories today from an anemia perspective to evaluate the potential role of repeat endoscopic evaluation versus continued close monitoring.  Due to his multiple comorbidities I feel that we must tread on endoscopic evaluation lightly.  He certainly is at risk in the setting of his multiple comorbidities for complications though he did well when we did his procedure in the hospital-based setting.  With this being said if he continues to have iron deficiency we likely will recommend iron infusions before any endoscopic evaluation unless his blood counts are significantly low.  We will plan follow-up and discuss endoscopic evaluation at that time.  The patient and family would like to hold on repeat endoscopic evaluations and understand that while we monitor him we could be missing an underlying process ongoing in the rest of the GI tract that has not been evaluated but certainly his risks at this point may outweigh the potential benefits of an endoscopic evaluation for now but we certainly will decide where to go from there.  All patient questions were answered  to the best of my ability, and the patient agrees to the aforementioned plan of action with follow-up as indicated.   PLAN  Maintain PPI 20 mg daily indefinitely Laboratories as outlined below If evidence of iron deficiency is present  will consider role of repeat endoscopic evaluation from above and potentially below but first will need intravenous iron and continued oral iron Holding on CT colonography or FIT/Cologuard versus colonoscopy discussion for now   Orders Placed This Encounter  Procedures   CBC   IBC + Ferritin    New Prescriptions   No medications on file   Modified Medications   No medications on file    Planned Follow Up Return in about 3 months (around 11/12/2020).   Total Time in Face-to-Face and in Coordination of Care for patient including independent/personal interpretation/review of prior testing, medical history, examination, medication adjustment, communicating results with the patient directly, and documentation with the EHR is 25 minutes.    Justice Britain, MD [facility] Office # [phone]

## 2020-08-15 NOTE — Patient Instructions (Addendum)
Your provider has requested that you go to the basement level for lab work before leaving today. Press "B" on the elevator. The lab is located at the first door on the left as you exit the elevator.  Due to recent changes in healthcare laws, you may see the results of your imaging and laboratory studies on MyChart before your provider has had a chance to review them.  We understand that in some cases there may be results that are confusing or concerning to you. Not all laboratory results come back in the same time frame and the provider may be waiting for multiple results in order to interpret others.  Please give [place] 48 hours in order for your provider to thoroughly review all the results before contacting the office for clarification of your results.   We will see you back in 3 months. Office will call with appointment for May 2022.  Continue current dose of daily iron supplements.Please also see lron Rich Diet attached.     Iron-Rich Diet  Iron is a mineral that helps your body to produce hemoglobin. Hemoglobin is a protein in red blood cells that carries oxygen to your body's tissues. Eating too little iron may cause you to feel weak and tired, and it can increase your risk of infection. Iron is naturally found in many foods, and many foods have iron added to them (iron-fortified foods). You may need to follow an iron-rich diet if you do not have enough iron in your body due to certain medical conditions. The amount of iron that you need each day depends on your age, your sex, and any medical conditions you have. Follow instructions from your health care provider or a diet and nutrition specialist (dietitian) about how much iron you should eat each day. What are tips for following this plan? Reading food labels  Check food labels to see how many milligrams (mg) of iron are in each serving. Cooking  Cook foods in pots and pans that are made from iron.  Take these steps to make it easier for  your body to absorb iron from certain foods: ? Soak beans overnight before cooking. ? Soak whole grains overnight and drain them before using. ? Ferment flours before baking, such as by using yeast in bread dough. Meal planning  When you eat foods that contain iron, you should eat them with foods that are high in vitamin C. These include oranges, peppers, tomatoes, potatoes, and mango. Vitamin C helps your body to absorb iron. General information  Take iron supplements only as told by your health care provider. An overdose of iron can be life-threatening. If you were prescribed iron supplements, take them with orange juice or a vitamin C supplement.  When you eat iron-fortified foods or take an iron supplement, you should also eat foods that naturally contain iron, such as meat, poultry, and fish. Eating naturally iron-rich foods helps your body to absorb the iron that is added to other foods or contained in a supplement.  Certain foods and drinks prevent your body from absorbing iron properly. Avoid eating these foods in the same meal as iron-rich foods or with iron supplements. These foods include: ? Coffee, black tea, and red wine. ? Milk, dairy products, and foods that are high in calcium. ? Beans and soybeans. ? Whole grains. What foods should I eat? Fruits Prunes. Raisins. Eat fruits high in vitamin C, such as oranges, grapefruits, and strawberries, alongside iron-rich foods. Vegetables Spinach (cooked). Green peas. Broccoli.  Fermented vegetables. Eat vegetables high in vitamin C, such as leafy greens, potatoes, bell peppers, and tomatoes, alongside iron-rich foods. Grains Iron-fortified breakfast cereal. Iron-fortified whole-wheat bread. Enriched rice. Sprouted grains. Meats and other proteins Beef liver. Oysters. Beef. Shrimp. [place]. Chicken. [place]. Sardines. Chickpeas. Nuts. Tofu. Pumpkin seeds. Beverages Tomato juice. Fresh orange juice. Prune juice. Hibiscus tea. Fortified  instant breakfast shakes. Sweets and desserts Blackstrap molasses. Seasonings and condiments Tahini. Fermented soy sauce. Other foods Wheat germ. The items listed above may not be a complete list of recommended foods and beverages. Contact a dietitian for more information. What foods should I avoid? Grains Whole grains. Bran cereal. Bran flour. Oats. Meats and other proteins Soybeans. Products made from soy protein. Black beans. Lentils. Mung beans. Split peas. Dairy Milk. Cream. Cheese. Yogurt. Cottage cheese. Beverages Coffee. Black tea. Red wine. Sweets and desserts Cocoa. Chocolate. Ice cream. Other foods Basil. Oregano. Large amounts of parsley. The items listed above may not be a complete list of foods and beverages to avoid. Contact a dietitian for more information. Summary  Iron is a mineral that helps your body to produce hemoglobin. Hemoglobin is a protein in red blood cells that carries oxygen to your body's tissues.  Iron is naturally found in many foods, and many foods have iron added to them (iron-fortified foods).  When you eat foods that contain iron, you should eat them with foods that are high in vitamin C. Vitamin C helps your body to absorb iron.  Certain foods and drinks prevent your body from absorbing iron properly, such as whole grains and dairy products. You should avoid eating these foods in the same meal as iron-rich foods or with iron supplements. This information is not intended to replace advice given to you by your health care provider. Make sure you discuss any questions you have with your health care provider. Document Revised: 06/06/2017 Document Reviewed: 05/20/2017 Elsevier Patient Education  2021 [place].  Thank you for choosing me and [facility].  Dr. Rush Landmark

## 2020-08-15 NOTE — Progress Notes (Incomplete Revision)
[place] VISIT   Primary Care Provider [name], [place] [address] [phone]  Patient Profile: Francisco [name] is a 75 y.o. male with a pmh significant for COPD (on home O2), PAD, CHF, prior PE, hyperlipidemia, colon polyps, duodenal AVMs.  The patient presents to the [facility] for an evaluation and management of problem(s) noted below:  Problem List No diagnosis found. *** History of Present Illness This is a patient whom I met in August who presented with anemia and melena while on aspirin in addition to anticoagulation.  He was a very increased risk individual in the setting of his underlying COPD and home oxygen use however it was felt that evaluation was necessary.  Upper endoscopy showed evidence of active GI bleeding in the setting of angioectasias and spots in the duodenal region.  Patient had these treated subsequently after.  Time was restarted on anticoagulation.  He has done very well since then and returns with his wife.  They have not had any significant issues over the course of the last few weeks.  Unfortunately, the patient has been reevaluated by vascular surgery and there is concern as to the potential need for a toe amputation.  Otherwise, the patient is doing and feeling well.  The patient denies any other significant GI issues.  He and his wife are not sure about any further endoscopic evaluation or procedures unless absolutely necessary due to his advanced COPD.  The patient does have personal history of colon polyps (adenomatous) with his last colonoscopy being 10 years ago.  He continues to take PPI 40 mg twice daily.  GI Review of Systems Positive as above Negative for pyrosis, dysphagia, odynophagia, nausea, vomiting, change in bowel habits, melena, hematochezia  Review of Systems General: Denies fevers/chills Cardiovascular: Denies chest pain Pulmonary: Shortness of breath  at baseline Gastroenterological: See HPI Genitourinary: Denies darkened urine or hematuria Hematological: Positive for easy bruising/bleeding due to anticoagulation Dermatological: Denies jaundice Psychological: Mood is stable   Medications Current Outpatient Medications  Medication Sig Dispense Refill  . acetaminophen (TYLENOL) 500 MG tablet Take 1,000 mg by mouth every 6 (six) hours as needed for moderate pain.     [name] Kitchen albuterol (VENTOLIN HFA) 108 (90 Base) MCG/ACT inhaler USE 2 PUFFS EVERY 6 HOURS  AS NEEDED FOR WHEEZING 18 g 3  . apixaban (ELIQUIS) 2.5 MG TABS tablet Take 1 tablet (2.5 mg total) by mouth 2 (two) times daily. 180 tablet 3  . azithromycin (ZITHROMAX) 250 MG tablet Take 1 tablet (250 mg total) by mouth every Monday, Wednesday, and Friday. 45 tablet 1  . budesonide-formoterol (SYMBICORT) 160-4.5 MCG/ACT inhaler Inhale 2 puffs into the lungs 2 (two) times daily.    . Calcium Carbonate-Vitamin D (CALCIUM 600+D PO) Take 2 tablets by mouth daily.    . cycloSPORINE (RESTASIS) 0.05 % ophthalmic emulsion Place 1 drop into both eyes 2 (two) times daily as needed (dry eyes).     . DALIRESP 500 MCG TABS tablet TAKE 1 TABLET BY MOUTH  DAILY 90 tablet 3  . Ferrous Sulfate (IRON) 325 (65 Fe) MG TABS Take 1 tablet by mouth daily.    . fluticasone (FLONASE) 50 MCG/ACT nasal spray Place 2 sprays into both nostrils daily. 48 g 3  . folic acid (FOLVITE) 1 MG tablet Take 1 tablet (1 mg total) by mouth daily.    . furosemide (LASIX) 20 MG tablet TAKE 1 TABLET (20 MG TOTAL) BY MOUTH  DAILY AS NEEDED FOR FLUID OR EDEMA. 90 tablet 3  . HYDROcodone-acetaminophen (NORCO/VICODIN) 5-325 MG tablet Take 2 tablets by mouth as needed.    . Multiple Vitamin (MULTIVITAMIN WITH MINERALS) TABS tablet Take 1 tablet by mouth daily.    . OXYGEN Inhale 4-5 L into the lungs continuous.     . pantoprazole (PROTONIX) 20 MG tablet Take 20 mg by mouth daily.    . predniSONE (DELTASONE) 10 MG tablet Take 1 tablet (10 mg  total) by mouth daily with breakfast. 90 tablet 0  . Respiratory Therapy Supplies (FLUTTER) DEVI 10 times Twice a day and prn as needed, may increase if feeling worse 1 each 0  . rosuvastatin (CRESTOR) 10 MG tablet Take 1 tablet (10 mg total) by mouth daily. 90 tablet 3  . sodium chloride HYPERTONIC 3 % nebulizer solution Take by nebulization daily. (Patient taking differently: Take 4 mLs by nebulization daily as needed (to loosen phlegm).) 90 mL 12  . Tiotropium Bromide Monohydrate (SPIRIVA RESPIMAT) 2.5 MCG/ACT AERS USE 2 INHALATIONS BY MOUTH  ONCE DAILY 12 g 3  . traMADol (ULTRAM) 50 MG tablet Take 1 tablet (50 mg total) by mouth every 12 (twelve) hours as needed for moderate pain or severe pain. 90 tablet 1   No current facility-administered medications for this visit.    Allergies Allergies  Allergen Reactions  . Tape Other (See Comments)    SKIN IS VERY THIN, TEARS SKIN; CAN ONLY USE COBAN WRAPS DUE TO CONDITION OF SKIN!!  . Ciprofloxacin Nausea Only    Sick on stomach, weak/tired  . Levaquin [Levofloxacin] Other (See Comments)    hallucinations  . Lipitor [Atorvastatin] Rash    Histories Past Medical History:  Diagnosis Date  . Chronic back pain    "mid and lower" (04/07/2018)  . Chronic rhinitis    -Sinus Ct 08/01/2009 >> Bilateral maxillary sinusitis with some mucosal thickeningin the sphenoid and frontal sinuses as well with air fluid levels present -chronic rhinitis flyer Aug 04, 2009  . Compressed spine fracture ([place])   . COPD (chronic obstructive pulmonary disease) [facility])    PFT's rec Jul 17, 2009  . Emphysema lung ([place])   . On home oxygen therapy    "3L; 24/7" (04/07/2018)  . Onychomycosis    Dr. Judi Cong  . Orthostatic hypotension    "since 10/2017" (04/07/2018)  . PAD (peripheral artery disease) ([place])   . Pneumonia    "twice in 1 year" (04/07/2018)  . Pulmonary embolism ([place]) 04/07/2018  . Skin cancer    "lips, face, ears, arms" (04/07/2018)  . Vertigo    "since ~  02/2018" (04/07/2018)   Past Surgical History:  Procedure Laterality Date  . ABDOMINAL AORTOGRAM W/LOWER EXTREMITY N/A 01/21/2020   Procedure: ABDOMINAL AORTOGRAM W/LOWER EXTREMITY;  Surgeon: Elam Dutch, MD;  Location: [facility];  Service: Cardiovascular;  Laterality: N/A;  . APPENDECTOMY    . BIOPSY  02/20/2020   Procedure: BIOPSY;  Surgeon: Rush Landmark Telford Nab., MD;  Location: [facility];  Service: Gastroenterology;;  . CATARACT EXTRACTION, BILATERAL    . ENDARTERECTOMY FEMORAL Right 04/19/2019   Procedure: ENDARTERECTOMY RIGHT COMMON FEMORAL;  Surgeon: Elam Dutch, MD;  Location: [facility];  Service: Vascular;  Laterality: Right;  . ENDARTERECTOMY FEMORAL Left 01/24/2020   Procedure: LEFT FEMORAL ENDARTERECTOMY WITH DACRON PATCH ANGIOPLASTY;  Surgeon: Elam Dutch, MD;  [facility];  Service: Vascular;  Laterality: Left;  . ESOPHAGOGASTRODUODENOSCOPY (EGD) WITH PROPOFOL N/A 02/20/2020   Procedure:  ESOPHAGOGASTRODUODENOSCOPY (EGD) WITH PROPOFOL;  Surgeon: Mansouraty, Telford Nab., MD;  Location: [facility];  Service: Gastroenterology;  Laterality: N/A;  . FEMORAL ENDARTERECTOMY Left 01/24/2020  . HEMOSTASIS CLIP PLACEMENT  02/20/2020   Procedure: HEMOSTASIS CLIP PLACEMENT;  Surgeon: Irving Copas., MD;  Location: [facility];  Service: Gastroenterology;;  . HOT HEMOSTASIS N/A 02/20/2020   Procedure: HOT HEMOSTASIS (ARGON PLASMA COAGULATION/BICAP);  Surgeon: Irving Copas., MD;  Location: [facility];  Service: Gastroenterology;  Laterality: N/A;  . INSERTION OF ILIAC STENT Left 01/24/2020   Procedure: INSERTION OF VBX STENT 8X59 AND 8X39 LEFT COMMON ILIAC ARTERY. INSERTION OF INNOVA 7 X 60 INNOVA STENT LEFT EXTERNAL ILIAC ARTERY. ;  Surgeon: Elam Dutch, MD;  Location: [facility];  Service: Vascular;  Laterality: Left;  . LOWER EXTREMITY ANGIOGRAPHY  12/11/2018  . LOWER EXTREMITY ANGIOGRAPHY N/A 12/11/2018   Procedure: LOWER EXTREMITY ANGIOGRAPHY;   Surgeon: Elam Dutch, MD;  Location: [facility];  Service: Cardiovascular;  Laterality: N/A;  . PATCH ANGIOPLASTY Right 04/19/2019   Procedure: Patch Angioplasty;  Surgeon: Elam Dutch, MD;  Location: [facility];  Service: Vascular;  Laterality: Right;  . PERIPHERAL VASCULAR INTERVENTION Right 12/11/2018   Procedure: PERIPHERAL VASCULAR INTERVENTION;  Surgeon: Elam Dutch, MD;  Location: [facility];  Service: Cardiovascular;  Laterality: Right;  Common Iliac   . SKIN CANCER EXCISION     "lips, face, ears, arms" (04/07/2018)  . TONSILLECTOMY    . ULTRASOUND GUIDANCE FOR VASCULAR ACCESS Right 01/24/2020   Procedure: ULTRASOUND GUIDANCE FOR VASCULAR ACCESS;  Surgeon: Elam Dutch, MD;  Location: [facility];  Service: Vascular;  Laterality: Right;   Social History   Socioeconomic History  . Marital status: Married    Spouse name: Not on file  . Number of children: 2  . Years of education: 3 years of college  . Highest education level: Not on file  Occupational History    Employer: [employer]  Tobacco Use  . Smoking status: Former Smoker    Packs/day: 2.00    Years: 52.00    Pack years: 104.00    Types: Pipe, Cigarettes    Start date: 10/06/1965    Quit date: 10/06/2017    Years since quitting: 2.8  . Smokeless tobacco: Never Used  . Tobacco comment: 04/07/2018 "smoked cigarettes years ago; stopped ~ 20 yr ago; stopped smoking pipe in 09/2017"  Vaping Use  . Vaping Use: Never used  Substance and Sexual Activity  . Alcohol use: Yes    Alcohol/week: 42.0 standard drinks    Types: 42 Cans of beer per week  . Drug use: No  . Sexual activity: Not Currently  Other Topics Concern  . Not on file  Social History Narrative   Married (wife Katy Apo patient of Dr. Yong Channel) with children (2-son and daughter). 1 grandson.       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       Hobbies: fishing, projects at home   Social Determinants of Adult nurse Strain: Not on file  Food Insecurity: Not on file  Transportation Needs: Not on file  Physical Activity: Not on file  Stress: Not on file  Social Connections: Not on file  Intimate Partner Violence: Not on file   Family History  Problem Relation Age of Onset  . Heart disease Mother        CABG in her 75s, nonsmoker  . Cancer  Mother   . Stroke Father   . Heart disease Father        Died of MI at age 53, smoker  . Hepatitis Sister   . Coronary artery disease Other        male 1st degree relative <60  . Colon cancer Neg Hx   . Pancreatic cancer Neg Hx   . Esophageal cancer Neg Hx   . Inflammatory bowel disease Neg Hx   . Liver disease Neg Hx   . Rectal cancer Neg Hx   . Stomach cancer Neg Hx    I have reviewed his medical, social, and family history in detail and updated the electronic medical record as necessary.    PHYSICAL EXAMINATION  BP 130/78   Pulse 98   Ht '5\' 8"'  (1.727 m)   Wt 176 lb 6.4 oz (80 kg)   SpO2 98% Comment: O2 @ 3L  BMI 26.82 kg/m  Wt Readings from Last 3 Encounters:  08/15/20 176 lb 6.4 oz (80 kg)  07/06/20 165 lb (74.8 kg)  05/19/20 165 lb (74.8 kg)  GEN: NAD, appears older than stated age, appears chronically ill but nontoxic, accompanied by wife PSYCH: Cooperative, without pressured speech EYE: Conjunctivae pink, sclerae anicteric ENT: MMM, nasal trumpet on CV: Tachycardic RESP: Rales and wheezing present GI: NABS, soft, NT/ND, without rebound or guarding MSK/EXT: Bilateral lower extremity edema present SKIN: No jaundice NEURO:  Alert & Oriented x 3, no focal deficits   REVIEW OF DATA  I reviewed the following data at the time of this encounter:  GI Procedures and Studies  August 2021 EGD - No gross lesions in esophagus. Z-line irregular, 40 cm from the incisors. - 3 cm hiatal hernia. - No gross lesions in the stomach. Biopsied for HP. - Blood in the duodenal bulb and in the second portion of the duodenum.  Lavaged with adequate visualization. - Using the Duodenoscope, a single angioectasia in the duodenum sweep was noted. Treated with argon plasma coagulation (APC). Clips (MR conditional) was placed. - Duodenitis with 3 areas of active oozing. Treated with argon plasma coagulation (APC). Clips (MR conditional) were placed to those regions. - Normal major papilla.  Laboratory Studies  Reviewed those in epic  Imaging Studies  No relevant studies to review   ASSESSMENT  Mr. Ebel is a 75 y.o. male with a pmh significant for COPD (on home O2), PAD, CHF, prior PE, hyperlipidemia, colon polyps, duodenal AVMs.  The patient is seen today for evaluation and management of:  No diagnosis found. The patient is hemodynamically and clinically stable from a GI perspective.  He seems to have been doing very well since the hospitalization.  The patient is a high risk individual for repeat endoscopic evaluation.  At this point in time we are going to monitor his counts closely.  No plan for repeat evaluation at this time.  However, if the patient redevelops anemia or has persistent anemia with iron deficiency then we will have to consider the role of repeat endoscopic evaluation both from above and potentially below (he has a history of prior colon polyps and is overdue for repeat colonoscopy).  Recommend hold on these procedures for now and reevaluate things when we see him back in February.  Certainly I think undergoing a colonoscopic evaluation is going to be higher risk and we would have to be very thoughtful if we did a colonoscopy versus consideration of a CT colonography versus just monitoring the patient closely if his blood  counts improved.  These will be discussions for [place] in the coming months.  We will asked the patient to continue twice daily Protonix through November 2021 and then decrease to 40 mg once daily in December and then go down to 20 mg once daily starting in January.  We will see how your labs  look in the next 6 weeks (it will be obtained at the time of your primary care provider follow-up).  We will see back in clinic in February or sooner if iron deficiency anemia is found.  All patient questions were answered to the best of my ability, and the patient and wife agree to the aforementioned plan of action with follow-up as indicated.   PLAN  Omeprazole 40 mg twice daily through November 2021 -Omeprazole 40 mg once daily through December 2021 -Omeprazole 20 mg once daily thereafter CBC/iron/TIBC/ferritin to be drawn in approximately 6 weeks (at time of PCP visit) If evidence of iron deficiency plus/minus anemia are present will consider role of repeat endoscopic evaluation from above and potentially below Otherwise if things are stable when we see in follow-up will consider role of CT colonography versus FIT/Cologuard versus colonoscopy   No orders of the defined types were placed in this encounter.   New Prescriptions   No medications on file   Modified Medications   No medications on file    Planned Follow Up No follow-ups on file.   Total Time in Face-to-Face and in Coordination of Care for patient including independent/personal interpretation/review of prior testing, medical history, examination, medication adjustment, communicating results with the patient directly, and documentation with the EHR is 30 minutes.   Justice Britain, MD [facility] Office # [phone]

## 2020-08-16 ENCOUNTER — Other Ambulatory Visit: Payer: Self-pay | Admitting: Family Medicine

## 2020-08-16 LAB — AFB CULTURE WITH SMEAR (NOT AT ARMC)
Acid Fast Culture: NEGATIVE
Acid Fast Smear: NEGATIVE

## 2020-08-18 ENCOUNTER — Encounter: Payer: Self-pay | Admitting: Family Medicine

## 2020-08-18 ENCOUNTER — Encounter: Payer: Self-pay | Admitting: Gastroenterology

## 2020-08-18 ENCOUNTER — Other Ambulatory Visit: Payer: Self-pay

## 2020-08-18 ENCOUNTER — Ambulatory Visit: Payer: Medicare Other | Admitting: Internal Medicine

## 2020-08-18 DIAGNOSIS — Z862 Personal history of diseases of the blood and blood-forming organs and certain disorders involving the immune mechanism: Secondary | ICD-10-CM | POA: Insufficient documentation

## 2020-08-18 DIAGNOSIS — R69 Illness, unspecified: Secondary | ICD-10-CM | POA: Insufficient documentation

## 2020-08-18 MED ORDER — PANTOPRAZOLE SODIUM 20 MG PO TBEC
20.0000 mg | DELAYED_RELEASE_TABLET | Freq: Every day | ORAL | 1 refills | Status: DC
Start: 2020-08-18 — End: 2021-03-07

## 2020-08-21 ENCOUNTER — Telehealth: Payer: Self-pay | Admitting: Physician Assistant

## 2020-08-21 ENCOUNTER — Other Ambulatory Visit: Payer: Self-pay

## 2020-08-21 DIAGNOSIS — D509 Iron deficiency anemia, unspecified: Secondary | ICD-10-CM

## 2020-08-21 DIAGNOSIS — D649 Anemia, unspecified: Secondary | ICD-10-CM

## 2020-08-21 MED ORDER — MUPIROCIN CALCIUM 2 % EX CREA: 1.0000 "application " | TOPICAL_CREAM | Freq: Two times a day (BID) | CUTANEOUS | 0 refills | Status: DC

## 2020-08-21 NOTE — Telephone Encounter (Signed)
Received a new hem referral from Dr. Rush Landmark for anemia. Francisco [name] has been cld and scheduled to see Cassie on 2/16 at 1:30pm w/labs at 1pm. Pt aware to arrive 15 minutes early.

## 2020-08-22 ENCOUNTER — Other Ambulatory Visit: Payer: Self-pay | Admitting: Specialist

## 2020-08-22 DIAGNOSIS — M545 Low back pain, unspecified: Secondary | ICD-10-CM

## 2020-08-22 NOTE — Progress Notes (Signed)
[place] [phone]   [phone]  CONSULT NOTE  REFERRING PHYSICIAN: Dr. Rush Landmark  REASON FOR CONSULTATION:  Iron Deficiency Anemia  HPI Francisco [name] is a 75 y.o. male with a past medical history significant for COPD currently on home oxygen, severe peripheral vascular disease (s/p endarterectomy right femoral in 2020 and left femoral in 01/2020), congestive heart failure, pulmonary embolism, hyperlipidemia, duodenal AVMs, hypertension, essential tremor, compression fractures, and osteoporosis is referred to the clinic for evaluation of anemia.  The patient was referred to the clinic by his gastroenterologist, Dr. Rush Landmark.  The patient follows with gastroenterology after he was hospitalized in August 2021 for acute anemia (presented to ER with a Hbg 5.0).  The patient was found to have GI blood loss due to duodenal AVMs which was treated with APC and clips. During the hospitalization the patient received 2 units of RBCs and IV iron with Nuclecit on 02/21/20.  He has been following closely with gastroenterology since that time.  He is currently being treated with Protonix.   On 08/15/20 the patient had a follow-up visit with gastroenterology.  At that time they were having a risk/benefit discussion about further fundoscopic evaluation.  Given his significant comorbidities (i.e COPD on home oxygen, severe peripheral vascular disease on chronic anticoagulation, etc), gastroenterology recommends treading lightly before pursuing a funduscopic evaluation given his comorbidities.  They recommend at this time continued close observation and monitoring.  He did have iron studies performed at this visit which showed a hemoglobin of 11.7, normal MCV of 86.8, and normal WBC at 9.8, iron on the low end of normal at 58, and a low saturation ratio 13%.  The patient's ferritin was 36.  His transferritin was within normal limits at 318.  They recommended that the  patient be referred to hematology for consideration of IV iron infusions.  They are planning to recheck his iron studies in 2 to 2-1/2 months.  Per chart review, it appears that the patient started having evidence of mild anemia in 2019 that worsened acutely after having his endarterectomy for his severe peripheral vascular disease in July 2021.  The patient has been on oral iron supplements for approximately 2-3 months. He takes this once a day and is compliant with this. He takes 65 mg of ferrous sulfate. He tolerates this well and takes a stool softener for constipation from his iron supplement. His last endoscopy was performed in August 2021 as stated above.  The patient's last colonoscopy per chart review appears that it was in 2011.  Overall, the patient's fatigue is stable.  He reports shortness of breath secondary to his severe COPD for which he is on supplemental oxygen. He occasionally has light headedness and vertigo due to being diagnosed with BPPV.  The patient denies NSAID use.  He is currently undergoing anticoagulation with Elliquis. The patient reports occasional epistaxis due to his blood thinner use and being on oxygen. The patient also has easy extremity bruising and has purpura on his upper extremities. He denies any hemoptysis, hematemesis, melena, hematuria, or hematochezia. The patient denies any particular dietary habits such as being a vegan or vegetarian. He is trying to increase his intake of iron rich food. He was given a handout on iron rich food by GI. The patient does not crave ice.  The patient denies any history of bariatric surgery.  Patient denies any fever, chills, night sweats, or unexplained weight loss.  The patient's family history consists of  a mother who had heart disease and uterine cancer. The patient's father passed away secondary to myocardial infarction. The patient had 2 sisters. One sister has fibromyalgia and hypertension. The other sister passed away after  having a blood disorder. She also had a history of breast cancer.  The patient used to work at a [employer]. He is married and has 2 children. He is a former smoker having smoked approximately 52 years. He primarily smokes a pipe. The patient follows closely with pulmonary medicine. The patient drinks approximately 5 to 6 cans of beer daily and has discussed this with his PCP. The patient denies any drug use.    HPI  Past Medical History:  Diagnosis Date  . Chronic back pain    "mid and lower" (04/07/2018)  . Chronic rhinitis    -Sinus Ct 08/01/2009 >> Bilateral maxillary sinusitis with some mucosal thickeningin the sphenoid and frontal sinuses as well with air fluid levels present -chronic rhinitis flyer Aug 04, 2009  . Compressed spine fracture ([place])   . COPD (chronic obstructive pulmonary disease) [facility])    PFT's rec Jul 17, 2009  . Emphysema lung ([place])   . On home oxygen therapy    "3L; 24/7" (04/07/2018)  . Onychomycosis    Dr. Judi Cong  . Orthostatic hypotension    "since 10/2017" (04/07/2018)  . PAD (peripheral artery disease) ([place])   . Pneumonia    "twice in 1 year" (04/07/2018)  . Pulmonary embolism ([place]) 04/07/2018  . Skin cancer    "lips, face, ears, arms" (04/07/2018)  . Vertigo    "since ~ 02/2018" (04/07/2018)    Past Surgical History:  Procedure Laterality Date  . ABDOMINAL AORTOGRAM W/LOWER EXTREMITY N/A 01/21/2020   Procedure: ABDOMINAL AORTOGRAM W/LOWER EXTREMITY;  Surgeon: Elam Dutch, MD;  Location: [place] [facility];  Service: Cardiovascular;  Laterality: N/A;  . APPENDECTOMY    . BIOPSY  02/20/2020   Procedure: BIOPSY;  Surgeon: Rush Landmark Telford Nab., MD;  Location: [place];  Service: Gastroenterology;;  . CATARACT EXTRACTION, BILATERAL    . ENDARTERECTOMY FEMORAL Right 04/19/2019   Procedure: ENDARTERECTOMY RIGHT COMMON FEMORAL;  Surgeon: Elam Dutch, MD;  Location: [place];  Service: Vascular;  Laterality: Right;  . ENDARTERECTOMY  FEMORAL Left 01/24/2020   Procedure: LEFT FEMORAL ENDARTERECTOMY WITH DACRON PATCH ANGIOPLASTY;  Surgeon: Elam Dutch, MD;  Location: [facility] OR;  Service: Vascular;  Laterality: Left;  . ESOPHAGOGASTRODUODENOSCOPY (EGD) WITH PROPOFOL N/A 02/20/2020   Procedure: ESOPHAGOGASTRODUODENOSCOPY (EGD) WITH PROPOFOL;  Surgeon: Irving Copas., MD;  Location: [place];  Service: Gastroenterology;  Laterality: N/A;  . FEMORAL ENDARTERECTOMY Left 01/24/2020  . HEMOSTASIS CLIP PLACEMENT  02/20/2020   Procedure: HEMOSTASIS CLIP PLACEMENT;  Surgeon: Irving Copas., MD;  Location: [place];  Service: Gastroenterology;;  . HOT HEMOSTASIS N/A 02/20/2020   Procedure: HOT HEMOSTASIS (ARGON PLASMA COAGULATION/BICAP);  Surgeon: Irving Copas., MD;  Location: [place];  Service: Gastroenterology;  Laterality: N/A;  . INSERTION OF ILIAC STENT Left 01/24/2020   Procedure: INSERTION OF VBX STENT 8X59 AND 8X39 LEFT COMMON ILIAC ARTERY. INSERTION OF INNOVA 7 X 60 INNOVA STENT LEFT EXTERNAL ILIAC ARTERY. ;  Surgeon: Elam Dutch, MD;  Location: [place];  Service: Vascular;  Laterality: Left;  . LOWER EXTREMITY ANGIOGRAPHY  12/11/2018  . LOWER EXTREMITY ANGIOGRAPHY N/A 12/11/2018   Procedure: LOWER EXTREMITY ANGIOGRAPHY;  Surgeon: Elam Dutch, MD;  Location: [place] [facility];  Service: Cardiovascular;  Laterality: N/A;  .  PATCH ANGIOPLASTY Right 04/19/2019   Procedure: Patch Angioplasty;  Surgeon: Elam Dutch, MD;  Location: [place];  Service: Vascular;  Laterality: Right;  . PERIPHERAL VASCULAR INTERVENTION Right 12/11/2018   Procedure: PERIPHERAL VASCULAR INTERVENTION;  Surgeon: Elam Dutch, MD;  Location: [place] [facility];  Service: Cardiovascular;  Laterality: Right;  Common Iliac   . SKIN CANCER EXCISION     "lips, face, ears, arms" (04/07/2018)  . TONSILLECTOMY    . ULTRASOUND GUIDANCE FOR VASCULAR ACCESS Right 01/24/2020   Procedure: ULTRASOUND GUIDANCE FOR VASCULAR  ACCESS;  Surgeon: Elam Dutch, MD;  Location: [facility] OR;  Service: Vascular;  Laterality: Right;    Family History  Problem Relation Age of Onset  . Heart disease Mother        CABG in her 59s, nonsmoker  . Cancer Mother   . Stroke Father   . Heart disease Father        Died of MI at age 41, smoker  . Hepatitis Sister   . Coronary artery disease Other        male 1st degree relative <60  . Colon cancer Neg Hx   . Pancreatic cancer Neg Hx   . Esophageal cancer Neg Hx   . Inflammatory bowel disease Neg Hx   . Liver disease Neg Hx   . Rectal cancer Neg Hx   . Stomach cancer Neg Hx     Social History Social History   Tobacco Use  . Smoking status: Former Smoker    Packs/day: 2.00    Years: 52.00    Pack years: 104.00    Types: Pipe, Cigarettes    Start date: 10/06/1965    Quit date: 10/06/2017    Years since quitting: 2.8  . Smokeless tobacco: Never Used  . Tobacco comment: 04/07/2018 "smoked cigarettes years ago; stopped ~ 20 yr ago; stopped smoking pipe in 09/2017"  Vaping Use  . Vaping Use: Never used  Substance Use Topics  . Alcohol use: Yes    Alcohol/week: 42.0 standard drinks    Types: 42 Cans of beer per week  . Drug use: No    Allergies  Allergen Reactions  . Tape Other (See Comments)    SKIN IS VERY THIN, TEARS SKIN; CAN ONLY USE COBAN WRAPS DUE TO CONDITION OF SKIN!!  . Ciprofloxacin Nausea Only    Sick on stomach, weak/tired  . Levaquin [Levofloxacin] Other (See Comments)    hallucinations  . Lipitor [Atorvastatin] Rash    Current Outpatient Medications  Medication Sig Dispense Refill  . acetaminophen (TYLENOL) 500 MG tablet Take 1,000 mg by mouth every 6 (six) hours as needed for moderate pain.     [name] Kitchen albuterol (VENTOLIN HFA) 108 (90 Base) MCG/ACT inhaler USE 2 PUFFS EVERY 6 HOURS  AS NEEDED FOR WHEEZING 18 g 3  . apixaban (ELIQUIS) 2.5 MG TABS tablet Take 1 tablet (2.5 mg total) by mouth 2 (two) times daily. 180 tablet 3  . azithromycin (ZITHROMAX)  250 MG tablet Take 1 tablet (250 mg total) by mouth every Monday, Wednesday, and Friday. 45 tablet 1  . budesonide-formoterol (SYMBICORT) 160-4.5 MCG/ACT inhaler Inhale 2 puffs into the lungs 2 (two) times daily.    . Calcium Carbonate-Vitamin D (CALCIUM 600+D PO) Take 2 tablets by mouth daily.    . cycloSPORINE (RESTASIS) 0.05 % ophthalmic emulsion Place 1 drop into both eyes 2 (two) times daily as needed (dry eyes).     . DALIRESP 500 MCG TABS tablet TAKE  1 TABLET BY MOUTH  DAILY 90 tablet 3  . Ferrous Sulfate (IRON) 325 (65 Fe) MG TABS Take 1 tablet by mouth daily.    . fluticasone (FLONASE) 50 MCG/ACT nasal spray Place 2 sprays into both nostrils daily. 48 g 3  . folic acid (FOLVITE) 1 MG tablet Take 1 tablet (1 mg total) by mouth daily.    . furosemide (LASIX) 20 MG tablet TAKE 1 TABLET (20 MG TOTAL) BY MOUTH DAILY AS NEEDED FOR FLUID OR EDEMA. 90 tablet 3  . HYDROcodone-acetaminophen (NORCO/VICODIN) 5-325 MG tablet Take 2 tablets by mouth as needed.    . Multiple Vitamin (MULTIVITAMIN WITH MINERALS) TABS tablet Take 1 tablet by mouth daily.    . mupirocin cream (BACTROBAN) 2 % Apply 1 application topically 2 (two) times daily. 22 g 0  . OXYGEN Inhale 4-5 L into the lungs continuous.     . pantoprazole (PROTONIX) 20 MG tablet Take 1 tablet (20 mg total) by mouth daily. 90 tablet 1  . predniSONE (DELTASONE) 10 MG tablet Take 1 tablet (10 mg total) by mouth daily with breakfast. 90 tablet 0  . Respiratory Therapy Supplies (FLUTTER) DEVI 10 times Twice a day and prn as needed, may increase if feeling worse 1 each 0  . rosuvastatin (CRESTOR) 10 MG tablet Take 1 tablet (10 mg total) by mouth daily. 90 tablet 3  . sodium chloride HYPERTONIC 3 % nebulizer solution Take by nebulization daily. (Patient taking differently: Take 4 mLs by nebulization daily as needed (to loosen phlegm).) 90 mL 12  . Tiotropium Bromide Monohydrate (SPIRIVA RESPIMAT) 2.5 MCG/ACT AERS USE 2 INHALATIONS BY  MOUTH&nbsp;&nbsp;ONCE DAILY 12 g 3  . traMADol (ULTRAM) 50 MG tablet Take 1 tablet (50 mg total) by mouth every 12 (twelve) hours as needed for moderate pain or severe pain. 90 tablet 1   No current facility-administered medications for this visit.    REVIEW OF SYSTEMS:   Review of Systems  Constitutional: Positive for stable fatigue. Negative for appetite change, chills,  fever and unexpected weight change.  HENT: Negative for mouth sores, nosebleeds, sore throat and trouble swallowing.   Eyes: Negative for eye problems and icterus.  Respiratory: Positive for baseline shortness of breath. Negative for cough, hemoptysis, and wheezing.   Cardiovascular: Positive for baseline bilateral lower extremity edema. Negative for chest pain. Gastrointestinal: Negative for abdominal pain, constipation, diarrhea, nausea and vomiting. Denies melena. Genitourinary: Negative for bladder incontinence, difficulty urinating, dysuria, frequency and hematuria.   Musculoskeletal: Positive for back pain secondary to compression fractures. Negative for gait problem, neck pain and neck stiffness.  Skin: Negative for itching and rash.  Neurological: Negative for dizziness, extremity weakness, gait problem, headaches, light-headedness and seizures.  Hematological: Positive for bruising easily. Negative for adenopathy. Psychiatric/Behavioral: Negative for confusion, depression and sleep disturbance. The patient is not nervous/anxious.     PHYSICAL EXAMINATION:  There were no vitals taken for this visit.  ECOG PERFORMANCE STATUS: 2-3  Physical Exam  Constitutional: Oriented to person, place, and time and well-developed, well-nourished, and in no distress.  HENT:  Head: Normocephalic and atraumatic.  Mouth/Throat: Oropharynx is clear and moist. No oropharyngeal exudate.  Eyes: Conjunctivae are normal. Right eye exhibits no discharge. Left eye exhibits no discharge. No scleral icterus.  Neck: Normal range of  motion. Neck supple.  Cardiovascular: Normal rate, regular rhythm, normal heart sounds and intact distal pulses.   Pulmonary/Chest: Effort normal. Quiet breath sounds in all lung fields. No respiratory distress. No wheezes. No  rales. On supplemental oxygen. Abdominal: Soft. Bowel sounds are normal. Exhibits no distension and no mass. There is no tenderness.  Musculoskeletal: Normal range of motion. Positive for bilateral lower extremity edema.  Lymphadenopathy:    No cervical adenopathy.  Neurological: Alert and oriented to person, place, and time. Exhibits muscle wasting. The patient was examined in the wheelchair.  Skin: Skin is warm and dry. No rash noted. Not diaphoretic. Positive for purpura on upper extremities.  Psychiatric: Mood, memory and judgment normal.  Vitals reviewed.  LABORATORY DATA: Lab Results  Component Value Date   WBC 9.8 08/15/2020   HGB 11.7 (L) 08/15/2020   HCT 36.0 (L) 08/15/2020   MCV 86.8 08/15/2020   PLT 270.0 08/15/2020      Chemistry      Component Value Date/Time   NA 139 07/06/2020 0941   NA 140 03/02/2020 0000   K 4.5 07/06/2020 0941   CL 97 (L) 07/06/2020 0941   CO2 33 (H) 07/06/2020 0941   BUN 27 (H) 07/06/2020 0941   BUN 14 03/02/2020 0000   CREATININE 1.14 07/06/2020 0941   GLU 90 03/02/2020 0000      Component Value Date/Time   CALCIUM 9.2 07/06/2020 0941   ALKPHOS 56 02/19/2020 0452   AST 21 07/06/2020 0941   ALT 17 07/06/2020 0941   BILITOT 0.5 07/06/2020 0941       RADIOGRAPHIC STUDIES: No results found.  ASSESSMENT: This is a very pleasant 75 year old Caucasian male referred to the clinic for iron deficiency anemia secondary to GI blood loss.   PLAN: The patient was seen with Dr. Julien Nordmann today.  The patient had a repeat CBC performed today which showed a hemoglobin slightly low at 12.1.  We did not repeat a iron study and ferritin as the patient has had this performed last week.  Gastroenterology is helpful for  consideration of IV iron infusions.    Dr. Earlie Server gave the patient the option of IV iron infusions versus continuing on oral iron supplements. The patient is in agreement to receive the IV iron. We will arrange for the patient to receive his first dose of treatment next week. We will arrange for the patient to receive Venofer 200 mg IV weekly x4.  We will see the patient back for follow-up visit in 7 weeks for evaluation and repeat CBC, iron studies, and ferritin.   The patient was encouraged to continue to try to increase his dietary intake of iron rich food.   The patient was encouraged to continue to take his oral iron supplement p.o. daily with stool softener.    The patient voices understanding of current disease status and treatment options and is in agreement with the current care plan.  All questions were answered. The patient knows to call the clinic with any problems, questions or concerns. We can certainly see the patient much sooner if necessary.  Thank you so much for allowing me to participate in the care of Francisco [name]. I will continue to follow up the patient with you and assist in his care.  I spent 40-54 minutes in this encounter.   Disclaimer: This note was dictated with voice recognition software. Similar sounding words can inadvertently be transcribed and may not be corrected upon review.   Teagen Bucio L Avraham Benish August 22, 2020, 12:07 PM  ADDENDUM: Hematology/Oncology Attending: I had a face-to-face encounter with the patient today.  I reviewed his record and recommended his care plan.  This is a very pleasant  75 years old white male with multiple comorbidities including history of COPD and he is currently on home oxygen in addition to hypertension, essential tremor, osteoporosis, compression fractures as well as severe peripheral vascular disease status post endarterectomy of the right femoral in 2020 and left femoral and 2021 and history of congestive  heart failure and pulmonary embolism.  The patient also has a history of duodenal AV malformation.  He was seen by his gastroenterologist Dr. Rush Landmark recently for evaluation of persistent anemia.  He had upper endoscopy that showed no gross lesions in the esophagus or the stomach but there was blood in the duodenal bulb and in the second portion of the duodenum.  There was a single angiectasia in the duodenum sweep.  This was treated with argon plasma coagulation.  There was duodenitis with 3 areas of active oozing.  These were also treated.  The patient has been on oral iron tablet for the last few weeks.  Repeat iron study by his gastroenterologist showed improvement in his iron level but not to the point of complete resolution of his anemia.  He also has mild improvement in his hemoglobin and hematocrit. Dr. Rush Landmark referred the patient to [place] today for evaluation and consideration of IV iron infusion. I had a lengthy discussion with the patient and his wife today about his condition.  He is improving on the oral iron tablet but not to the point of complete resolution of his anemia. I recommended for the patient to proceed with iron infusion with Venofer for the next 4 weeks. We will see him back for follow-up visit in 6-7 weeks for evaluation and repeat blood work and consideration of further iron infusion if needed. The patient and his wife agreed to the current plan. He was advised to call immediately if he has any other concerning symptoms in the interval.  Disclaimer: This note was dictated with voice recognition software. Similar sounding words can inadvertently be transcribed and may be missed upon review. Eilleen Kempf, MD 08/23/20

## 2020-08-23 ENCOUNTER — Other Ambulatory Visit: Payer: Self-pay

## 2020-08-23 ENCOUNTER — Other Ambulatory Visit: Payer: Self-pay | Admitting: Physician Assistant

## 2020-08-23 ENCOUNTER — Inpatient Hospital Stay: Payer: Medicare Other

## 2020-08-23 ENCOUNTER — Telehealth: Payer: Self-pay | Admitting: Internal Medicine

## 2020-08-23 ENCOUNTER — Inpatient Hospital Stay: Payer: Medicare Other | Attending: Physician Assistant | Admitting: Physician Assistant

## 2020-08-23 VITALS — BP 125/69 | HR 109 | Temp 97.6°F | Resp 16 | Ht 68.0 in | Wt 177.9 lb

## 2020-08-23 DIAGNOSIS — Z86711 Personal history of pulmonary embolism: Secondary | ICD-10-CM

## 2020-08-23 DIAGNOSIS — J449 Chronic obstructive pulmonary disease, unspecified: Secondary | ICD-10-CM

## 2020-08-23 DIAGNOSIS — Z9981 Dependence on supplemental oxygen: Secondary | ICD-10-CM

## 2020-08-23 DIAGNOSIS — M810 Age-related osteoporosis without current pathological fracture: Secondary | ICD-10-CM

## 2020-08-23 DIAGNOSIS — Q2730 Arteriovenous malformation, site unspecified: Secondary | ICD-10-CM

## 2020-08-23 DIAGNOSIS — D500 Iron deficiency anemia secondary to blood loss (chronic): Secondary | ICD-10-CM | POA: Insufficient documentation

## 2020-08-23 DIAGNOSIS — K922 Gastrointestinal hemorrhage, unspecified: Secondary | ICD-10-CM | POA: Insufficient documentation

## 2020-08-23 DIAGNOSIS — I10 Essential (primary) hypertension: Secondary | ICD-10-CM | POA: Diagnosis not present

## 2020-08-23 DIAGNOSIS — I5020 Unspecified systolic (congestive) heart failure: Secondary | ICD-10-CM

## 2020-08-23 DIAGNOSIS — D649 Anemia, unspecified: Secondary | ICD-10-CM

## 2020-08-23 HISTORY — DX: Iron deficiency anemia secondary to blood loss (chronic): D50.0

## 2020-08-23 LAB — CBC WITH DIFFERENTIAL (CANCER CENTER ONLY)
Abs Immature Granulocytes: 0.22 10*3/uL — ABNORMAL HIGH (ref 0.00–0.07)
Basophils Absolute: 0 10*3/uL (ref 0.0–0.1)
Basophils Relative: 0 %
Eosinophils Absolute: 0 10*3/uL (ref 0.0–0.5)
Eosinophils Relative: 0 %
HCT: 37.1 % — ABNORMAL LOW (ref 39.0–52.0)
Hemoglobin: 12.1 g/dL — ABNORMAL LOW (ref 13.0–17.0)
Immature Granulocytes: 2 %
Lymphocytes Relative: 6 %
Lymphs Abs: 0.6 10*3/uL — ABNORMAL LOW (ref 0.7–4.0)
MCH: 29.4 pg (ref 26.0–34.0)
MCHC: 32.6 g/dL (ref 30.0–36.0)
MCV: 90.3 fL (ref 80.0–100.0)
Monocytes Absolute: 0.5 10*3/uL (ref 0.1–1.0)
Monocytes Relative: 5 %
Neutro Abs: 8.9 10*3/uL — ABNORMAL HIGH (ref 1.7–7.7)
Neutrophils Relative %: 87 %
Platelet Count: 237 10*3/uL (ref 150–400)
RBC: 4.11 MIL/uL — ABNORMAL LOW (ref 4.22–5.81)
RDW: 17.4 % — ABNORMAL HIGH (ref 11.5–15.5)
WBC Count: 10.4 10*3/uL (ref 4.0–10.5)
nRBC: 0 % (ref 0.0–0.2)

## 2020-08-23 NOTE — Telephone Encounter (Signed)
Scheduled follow-up appointments per 2/16 los. Patient is aware. 

## 2020-08-24 ENCOUNTER — Encounter (HOSPITAL_COMMUNITY): Payer: Medicare Other

## 2020-08-24 ENCOUNTER — Ambulatory Visit: Payer: Medicare Other | Admitting: Vascular Surgery

## 2020-08-30 ENCOUNTER — Inpatient Hospital Stay: Payer: Medicare Other

## 2020-08-30 ENCOUNTER — Other Ambulatory Visit: Payer: Self-pay

## 2020-08-30 VITALS — BP 149/86 | HR 93 | Temp 98.6°F | Resp 18

## 2020-08-30 DIAGNOSIS — D500 Iron deficiency anemia secondary to blood loss (chronic): Secondary | ICD-10-CM | POA: Diagnosis not present

## 2020-08-30 DIAGNOSIS — D649 Anemia, unspecified: Secondary | ICD-10-CM

## 2020-08-30 MED ORDER — DIPHENHYDRAMINE HCL 25 MG PO CAPS
25.0000 mg | ORAL_CAPSULE | Freq: Once | ORAL | Status: AC
  Administered 2020-08-30: 25 mg via ORAL

## 2020-08-30 MED ORDER — SODIUM CHLORIDE 0.9 % IV SOLN
Freq: Once | INTRAVENOUS | Status: AC
  Filled 2020-08-30: qty 250

## 2020-08-30 MED ORDER — SODIUM CHLORIDE 0.9 % IV SOLN
200.0000 mg | Freq: Once | INTRAVENOUS | Status: AC
  Administered 2020-08-30: 200 mg via INTRAVENOUS
  Filled 2020-08-30: qty 200

## 2020-08-30 MED ORDER — ACETAMINOPHEN 325 MG PO TABS
ORAL_TABLET | ORAL | Status: AC
  Filled 2020-08-30: qty 2

## 2020-08-30 MED ORDER — DIPHENHYDRAMINE HCL 25 MG PO CAPS
ORAL_CAPSULE | ORAL | Status: AC
  Filled 2020-08-30: qty 1

## 2020-08-30 MED ORDER — ACETAMINOPHEN 325 MG PO TABS
650.0000 mg | ORAL_TABLET | Freq: Once | ORAL | Status: AC
  Administered 2020-08-30: 650 mg via ORAL

## 2020-08-30 NOTE — Progress Notes (Signed)
Patient was observed for 30 minutes post-infusion with no complaints. Vitals stable and patient in no distress upon leaving infusion clinic.

## 2020-08-30 NOTE — Patient Instructions (Signed)

## 2020-08-31 ENCOUNTER — Encounter: Payer: Self-pay | Admitting: Internal Medicine

## 2020-08-31 ENCOUNTER — Ambulatory Visit (INDEPENDENT_AMBULATORY_CARE_PROVIDER_SITE_OTHER): Payer: Medicare Other | Admitting: Internal Medicine

## 2020-08-31 VITALS — BP 118/70 | HR 111 | Temp 97.4°F | Ht 72.0 in | Wt 175.0 lb

## 2020-08-31 DIAGNOSIS — J9611 Chronic respiratory failure with hypoxia: Secondary | ICD-10-CM

## 2020-08-31 DIAGNOSIS — J449 Chronic obstructive pulmonary disease, unspecified: Secondary | ICD-10-CM

## 2020-08-31 DIAGNOSIS — J441 Chronic obstructive pulmonary disease with (acute) exacerbation: Secondary | ICD-10-CM

## 2020-08-31 DIAGNOSIS — Z86711 Personal history of pulmonary embolism: Secondary | ICD-10-CM

## 2020-08-31 MED ORDER — CEFDINIR 300 MG PO CAPS: 300.0000 mg | ORAL_CAPSULE | Freq: Two times a day (BID) | ORAL | 0 refills | Status: DC

## 2020-08-31 MED ORDER — PREDNISONE 10 MG (21) PO TBPK: ORAL_TABLET | Freq: Every day | ORAL | 0 refills | Status: DC

## 2020-08-31 NOTE — Patient Instructions (Addendum)
ICD-10-CM   1. Chronic respiratory failure with hypoxia (HCC)  J96.11   2. COPD exacerbation ([place])  J44.1   3. COPD, frequent exacerbations ([place])  J44.1   4. 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 History of pulmonary embolus (PE)  Z86.711     Currently having another exacerbation despite optimal COPD care/treatment  Plan - Please take Take prednisone 40mg  once daily x 3 days, then 30mg  once daily x 3 days, then 20mg  once daily x 3 days, then prednisone 10mg  once daily basekube to continue   -Start omnicef 300mg  bid x 14 days per your request (hold azithro during this time)  -Continue oxygen, Spiriva, Symbicort, Daliresp and 3 times weekly azithromycin and daily prednisone otherwise [during flareups hold azithromycin and during flareup to a different schedule of prednisone as above]  -Continue anticoagulation  - strt mucinex  -   Sleep with head end of the bed elevated and feet  Follow-up -3 months or sooner with CAT score at follow-up print

## 2020-08-31 NOTE — Progress Notes (Signed)
ROV 12/30/16 -- patient has a history of tobacco use, COPD currently maintained on chronic prednisone, Symbicort, Spiriva. Most recent blurry function testing was March 2011 with an FEV1 of 1.2 L (34% Pred), severe obstruction. He up titrates his prednisone on his own based on symptoms. No abx since last time. He has increased pred temporrily about 4 -5 x since last time. No increases for over a month. He is smoking a pipe, not cigarettes. He is using HCTZ more frequently these days. He is on flonase and singulair. He does note that he has some slow progression of his DOE. He coughs a few times a day, clears clear sputum.   rov 07/17/17 --this is a follow-up visit for patient with active tobacco use, severe COPD and chronic prednisone use.  He often titrates his prednisone on his own depending on how he is feeling. He complains of a new pain in his back below his left shoulder blade, has been present for the last 10 days. sometimes pleuritic, can be stabbing, worse w cough. His pred use: on 10mg  for the last 19 days. No hx VTE. He has LE edema, venous stasis changes, increased since his diuretics decreased.   ROV 09/23/17 --75 year old man with a history of tobacco use (still smokes pipe), severe COPD and severe obstruction on spirometry.  At his last visit he had some pleuritic left back pain and I performed a CT PA that I have reviewed from 07/17/17.  There was no pulmonary embolism but extensive emphysema present, some bilateral apical scar more so on the right, stable.  He uses prednisone 10 mg daily and uptitrate depending on his day-to-day symptoms.  He is otherwise managed on Symbicort and Spiriva.  Desaturated on arrival today after ambulating to the exam room. He reports more dyspnea, has increased pred intermittently.    OV 12/05/2017  Chief Complaint  Patient presents with  . Follow-up    Switching from RB to MR.   Pt is on 3L with exertion and 2L at rest. Pt states his breathing had  become worse but since he has been on O2 since 3/21, it has really helped with his breathing.  Pt was in hospital 4/28-5/3. Other than SOB, pt does have c/o cough, rattling in chest. Denies any CP/chest tightness.   History from patient, his wife and review of the old chart  Talmadge Coventry IIIs a transfer of care from [name] to myself [name].  His son is my patient and therefore he is done this transfer of care.  According to the patient he is to be seen by [name] for COPD.  Patient is FEV1 34% based on March 2011 PFTs according to chart review.  At baseline he is maintained on Spiriva, Symbicort, Singulair, chronic daily prednisone 10 mg/day for at least 3 years, and 3 times a week of azithromycin for 2 years.  Earlier this year in March 2019 he went on daytime oxygen and following a recent hospitalization April-May 2019.  For syncope that was believed due to nocturnal hypoxemia he was started on night oxygen.  Since starting oxygen his edema has improved and his overall quality of life is improved and he stopped having any orthostasis or presyncopal episodes.  He is grateful for the fact that he is on oxygen.  His current COPD CAT score and severity of symptoms is rated below and is 26.  Show significant amount of symptom burden.  His main goal is to  improve his quality of life.  He had his wife have many questions centering around quality of life, medication therapy.  Of note he has had some thoracic vertebral fractures that are new.  This happened after the fall in late April 2019.  Discovered at admission last month.  He feels is due to prednisone.  He is wondering about portable oxygen.   OV 01/29/2018  Chief Complaint  Patient presents with  . Follow-up    Pt states he has been doing okay since last visit. States breathing is about the same, has an occ cough but states the O2 has helped out a lot with breathing and cough. Denies any complaints of CP.   Lesia Sago II , 75 y.o. ,  with dob [birth year] and male ,Not Hispanic or Latino from [place] [id number] - presents to lung clinic for advanced COPD follow-up. Since his last visit his symptoms course of improvement. Score is 26 and severe symptoms of document below. He is on Spiriva, Symbicort, continues oxygen, daily prednisone and azithromycin 3 times a week. He has stopped his singulair without any problems. Overall he says he is better. He is willing to give TRELEGY inhaler trial. Last visit to check blood gas and he does not have hypercapnia so he does not qualify for BiPAP        OV 04/03/2018  Subjective:  Patient ID: Marcelyn Bruins, male , DOB: [birth year] , age 54 y.o. , MRN: [id number] , ADDRESS: [place] [address]   04/03/2018 -   Chief Complaint  Patient presents with  . Acute Visit    Pt's O2 sats have been dropping into the 70s with little exertion.  Pt took O2 off just to shave 9/26 and after he finished shaving put the O2 back on, walked from the bathroom down to the living room on O2 and sats were at 77% on 3L 9/26. Pt also has c/o cough with white to yellow mucus and chest tightness with the SOB.     HPI IVON OELKERS II 75 y.o. -acute visit for this patient.  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  He is chronically prednisone dependent.  He also is on schedule azithromycin.  He called in yesterday feeling unwell therefore we asked him to come in today.  He tells me that approximately a week ago he started having increased cough and congestion.  Yesterday primary care physician following a routine visit thought he was in COPD exacerbation and started him on doxycycline.  He also personally bumped up his baseline prednisone.  He does me that yesterday while he was shaving on room air he felt more short of breath than usual.  Also when he walked he desaturated more than usual therefore he decided to call in and come in today.   There is no fever or chills or hemoptysis or colored sputum.  No leg edema.  No orthopnea.  He does not feel like he needs to be in the emergency department or get admitted.   OV 05/11/2018  Subjective:  Patient ID: Marcelyn Bruins, male , DOB: 10-Jun-1946 , age 56 y.o. , MRN: [id number] , ADDRESS: [place] [address]   05/11/2018 -   Chief Complaint  Patient presents with  . Follow-up    pt states breathing has wrosen since last OV. increased sob, occ chest tightness, prod cough with yellow  mucus & chest congestion. currently taking trelegy samples. recent admission 04/07/18 for PE.     HPI Lesia Sago II 75 y.o. -advanced COPD with chronic hypoxemic respiratory failure [not a BiPAP candidate because of lack of hypercapnia] presents with his wife for follow-up.  After the last visit he continued to have symptoms so on April 07, 2018 he got an outpatient CT scan and he had pulmonary embolism.  He got admitted to the hospital and then discharged and since then has been on Xarelto.  He continues his baseline 2-3 L of nasal cannula oxygen but he says since then he is more symptomatic.  Is also corresponds with him starting Trelegy inhaler and also for the last few to several weeks is having increased cough and congestion and yellow phlegm.  His COPD CAT score is deteriorated to 35 and above.  He is very frustrated by his symptoms.  Review of his medication shows that he takes azithromycin and chronic prednisone and Trelegy and oxygen for his COPD.  He is on Xarelto for his blood clots.  He is also on Fosamax for prednisone dependent osteoporosis management.  His smoking is in remission.  Wife says he is always sedentary.  Apparently they tried to do some physical therapy a month ago but he desaturated.  He does not seem to keen on physical therapy right now     OV 06/10/2018  Subjective:  Patient ID: Marcelyn Bruins, male , DOB: 1946-05-13 , age 59 y.o. , MRN: [id number] ,  ADDRESS: [place] [address]   06/10/2018 -   Chief Complaint  Patient presents with  . Follow-up    follows for COPD. patient has increased SOB with exertion     HPI Lesia Sago II 75 y.o. -returns for gold stage IV COPD follow-up with chronic hypoxemic respiratory failure.  I saw him approximately a month ago at which time recommended Trelegy.  However the Trelegy is not working well for him.  He feels Spiriva and Symbicort is of benefit for him.  Also recommended he start himself on Daliresp.  Given the GI side effect profile I communicated with his primary care physician about stopping Fosamax.  His primary care physician has advised Reclast infusion for him and is considering this but has not yet switched over.  This because the infusions will have to happen at [facility].  However he never started his Daliresp because he read the side effect profile and was worried about back pain.  He wanted me to go over the side effect profile of Daliresp all over again.  I printed up-to-date and went over all the side effects.  I did this with him and his wife.  There is a 3% incidence of backache.  The dominant feature is GI issues of weight loss and diarrhea and nausea and abdominal pain.  The second dominant feature is some anxiety issues.  After reading all this he has decided to start [place].  He is aware of the limitations as well.  He is aware that this is purely preventive in reducing COPD exacerbation frequency.  He understands that the burden on his health from frequent COPD exacerbations is high.  Currently COPD CAT score is back at baseline        OV 07/22/2018  Subjective:  Patient ID: Marcelyn Bruins, male , DOB: 02-23-1946 , age 34 y.o. , MRN: [id number] , ADDRESS: [place] [address]  07/22/2018 -   Chief Complaint  Patient presents with  . Follow-up    Pt states it has been rough since last visit. States he has been coughing with  white to yellow phlegm, wheezing, increased SOB which has been going on x2-3 weeks now. Pt denies any real complaints of chest tightness/chest pain.     HPI ATWOOD ADCOCK II 75 y.o. -presents for follow-up of his advanced COPD with chronic hypoxemic respiratory failure.  At last visit we introduce Daliresp as is an effort to prevent COPD flareups.  After some trepidation he started taking it.  He is currently on full dose 5 mcg daily and is tolerating it well.  However he does not think it is reduced the frequency of flareups but again it is too soon to tell.  Approximately over a week ago after some sick contact exposure he had symptoms and signs of COPD exacerbation.  He was given I suspect Levaquin but this caused some confusion.  After that he was changed to doxycycline.  Today's his last of 7 days with doxycycline he is better.  COPD CAT score is improved to 26 but he still is coughing quite a bit and has mucus and feels that he is still not fully back to baseline.  He had a chest x-ray July 13, 2018 that reports this is clear.  He continues on oxygen Spiriva and Symbicort.  There are no other new issues.  No chest pain edema hemoptysis or weight loss.    OV 03/05/2019  Subjective:  Patient ID: Marcelyn Bruins, male , DOB: 1946-01-12 , age 14 y.o. , MRN: [id number] , ADDRESS: [place] [address]   03/05/2019 -   Chief Complaint  Patient presents with  . COPD with acute exacerbation    Feels it is a little worse since June. Has cough with congestion, was white in color. But this morning it has a yellow and green color to it.     HPI KEISON GLENDINNING II 75 y.o. -returns for his advanced COPD follow-up.  He says that since his last visit he has been doing overall fair and stable.  Uses 3 L of oxygen, Daliresp, Spiriva, Symbicort.  He has been avoiding risk activities for COVID-19.  He says in the last few weeks he has had increased congestion he does not think this is  COVID-19.  Today he has green-yellow sputum.  He is asking about taking a flu shot.    OV 05/07/2019  Subjective:  Patient ID: Marcelyn Bruins, male , DOB: 12-31-1945 , age 45 y.o. , MRN: [id number] , ADDRESS: [place] [address]   05/07/2019 -   Chief Complaint  Patient presents with  . Follow-up    Pt states his breathing is at his baseline. Pt c/o prod cough with clear mucus - baseline for pt. Pt denies CP/tightness and f/c/s.    Advance COPD with chronic hypoxemic respiratory failure.  Baseline functional status ECOG 3-4  HPI Lesia Sago II 75 y.o. -presents for COPD follow-up.  He continues to be stable using oxygen.  His current COPD CAT score is 30 which is his baseline.  He recently had a vascular surgery.  He is on oxygen, prednisone, Daliresp, Spiriva and Symbicort.  He is gone back to taking azithromycin for prophylaxis m again COPD exacerbation.        OV 11/11/2019  Subjective:  Patient ID: Lesia Sago II,  male , DOB: 1945-09-11 , age 42 y.o. , MRN: [id number] , ADDRESS: [place] [address]   11/11/2019 -   Chief Complaint  Patient presents with  . Follow-up    Pt states his breathing is progressively becoming worse and states he feels like he may be in a flare with the COPD. Pt states he is coughing up yellow-green phlegm and also has complaints of wheezing.   Advance COPD patient with recurrent COPD exacerbations.  HPI BRAVE DACK II 75 y.o. -presents for follow-up.  He says last month he saw [name] nurse practitioner.  He says he was given a different antibiotic that really helped.  Review of the records indicate one time it was Augmentin another time it was [place].  He believes the second 1 really helped.  He feels in another exacerbation.  He is having green sputum.  He says with the flutter valve and exacerbation a lot of sputum comes out but between exacerbations is very minimal.  He is frustrated by his  recurrent exacerbations.  He did not tolerate Trelegy in the past.  He is on Spiriva, Symbicort, oxygen 3 L, flutter valve, Flonase, Roflumilast and 3 times a week of azithromycin.  He is looking to see if he can improve his quality of life.  His COPD CAT score is 34.  His last CT scan of the chest was in 2019.     CAT COPD Symptom & Quality of Life Score (GSK trademark) 0 is no burden. 5 is highest burden 12/05/2017  01/29/2018  05/11/2018  06/10/2018  07/22/2018  03/05/2019  05/07/2019   Never Cough -> Cough all the time 3 1 5 5 2 3 3   No phlegm in chest -> Chest is full of phlegm 2 2 4 5 3  3.5 3  No chest tightness -> Chest feels very tight 1 0 3 0 1 2 3   No dyspnea for 1 flight stairs/hill -> Very dyspneic for 1 flight of stairs 5 4 5 5 5 5 5   No limitations for ADL at home -> Very limited with ADL at home 5 5 5 5 5 5 4   Confident leaving home -> Not at all confident leaving home 3 3 3 4 4 3 4   Sleep soundly -> Do not sleep soundly because of lung condition 3 1 2  0 1 4 4   Lots of Energy -> No energy at all 4 4 5 5 5 4 4   TOTAL Score (max 40)  26 20 37 29 26 29.5 30   CAT Score 11/11/2019 03/05/2019 04/03/2018  Total CAT Score 32 30 30     OV 11/26/2019 - telephone visit. 2PHI identified. Risks, benefit, limitation explained  Subjective:  Patient ID: Marcelyn Bruins, male , DOB: February 28, 1946 , age 16 y.o. , MRN: [id number] , ADDRESS: [place] [address] Advanced COPD with recurrent exacerbation now colonized by pseudomonas  11/26/2019 -    HPI DAYMION NAZAIRE II 75 y.o. -last seen by myself Nov 11, 2019.  At that time we decided to do a work-up for recurrent exacerbations.  His immunoglobulin levels are normal.  His sputum grew Pseudomonas that is pansensitive.  He CT scan of the chest shows right upper lobe worsening fibrotic pattern.  There is no mass per se.  I personally visualized the CT chest.  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  Today in the visit he tells me after 3 days he had nausea and then he had joint pain on the third day and quit taking it.  He still continues to be symptomatic.     ROS - per HPI  CT Chest Wo Contrast  Result Date: 11/25/2019 CLINICAL DATA:  COPD exacerbation. Severe shortness of breath. EXAM: CT CHEST WITHOUT CONTRAST TECHNIQUE: Multidetector CT imaging of the chest was performed following the standard protocol without IV contrast. COMPARISON:  04/07/2018. FINDINGS: Cardiovascular: The heart size is normal. There is no pericardial effusion. Aortic atherosclerosis. Three vessel coronary artery atherosclerotic calcifications identified Mediastinum/Nodes: No enlarged mediastinal or axillary lymph nodes. Thyroid gland, trachea, and esophagus demonstrate no significant findings. Lungs/Pleura: No pleural effusion identified. Advanced changes of centrilobular and paraseptal emphysema with mild bullous changes. Progressive confluent fibrotic with extensive architectural distortion and volume loss no acute airspace consolidation, atelectasis or pneumothorax. Scattered, bilateral areas of pleuroparenchymal scarring identified changes within the apical portions of the right upper lobe are again noted. Upper Abdomen: Bilateral low-attenuation adrenal nodules appears similar to previous exam and are compatible with benign adenomas. No acute abnormality identified within the imaged portions of the upper abdomen. Musculoskeletal: Mild scoliosis and degenerative disc disease noted within the thoracic spine. Stable chronic compression deformities at T6, T7 and T8. No new compression fractures. IMPRESSION: 1. No acute cardiopulmonary abnormalities. 2. Progressive confluent fibrotic changes within the right upper lobe with extensive architectural distortion and volume loss. Likely the sequelae of prior inflammation/infection. 3. Aortic atherosclerosis,  in addition to 3 vessel coronary artery disease. Please note that although the presence of coronary artery calcium documents the presence of coronary artery disease, the severity of this disease and any potential stenosis cannot be assessed on this non-gated CT examination. Assessment for potential risk factor modification, dietary therapy or pharmacologic therapy may be warranted, if clinically indicated. 4. Bilateral adrenal adenomas. 5. Stable chronic compression deformities at T6, T7 and T8. 6. Diffuse bronchial wall thickening with emphysema, as above; imaging findings suggestive of underlying COPD. Aortic Atherosclerosis (ICD10-I70.0) and Emphysema (ICD10-J43.9). Electronically Signed   By: Kerby Moors M.D.   On: 11/25/2019 12:21     OV 05/03/2020  Subjective:  Patient ID: Marcelyn Bruins, male , DOB: January 31, 1946 , age 72 y.o. , MRN: [id number] , ADDRESS: [place] [address] PCP [name], MD Patient Care Team: [name], MD as PCP - General (Family Medicine) Brand Males, MD as Consulting Physician (Pulmonary Disease) Danella Sensing, MD as Consulting Physician (Dermatology) Dr. Henreitta Leber, DDS (Dentistry)  This Provider for this visit: Treatment Team:  Attending Provider: Brand Males, MD    05/03/2020 -   Chief Complaint  Patient presents with  . Follow-up    doing best he has in a year and a half.     ICD-10-CM   1. Stage 4 very severe COPD by GOLD classification ([place])  J44.9   2. Chronic respiratory failure with hypoxia (HCC)  J96.11   3. COPD, frequent exacerbations ([place])  J44.1   4. Abnormal findings on diagnostic imaging of lung  R91.8   5. Pulmonary embolism, unspecified chronicity, unspecified pulmonary embolism type, unspecified whether acute cor pulmonale present (HCC)  I26.99   6. Vaccine counseling  Z71.85      HPI SHAAN RHOADS II 75 y.o. -presents with his wife for the above issues.  He said his Covid  booster has had his  flu shot.  He has follow-up coming end of November with primary care physician.  He wants me to send a note to the primary care physician.  He is without any exacerbations.  He continues prednisone, azithromycin, Roflumilast, Spiriva and Symbicort.  He does not want to do triple inhaler therapies.  He is on anticoagulation.  He says after the GI bleeding issues resolved he is better.  He had a CT scan of the chest that is documented below.  His infiltrates are improving but there is mucus/debris in the main airway he is denying any aspiration.  He does have hiatal hernia.  Apparently this was discovered during endoscopy.  CAT Score 05/03/2020 11/11/2019 03/05/2019 04/03/2018  Total CAT Score 16 32 30 30     IMPRESSION: 1. Trace right pleural effusion and associated atelectasis or consolidation, both of which are improved compared to prior examination. Findings are generally consistent with resolving sequelae of prior infection or aspiration. 2. Diffuse bilateral bronchial wall thickening consistent with nonspecific infectious or inflammatory bronchitis. 3. Frothy debris in the bilateral mainstem bronchi, concerning for aspiration. 4. Severe centrilobular emphysema. Emphysema (ICD10-J43.9). 5. Nonspecific fibrotic scarring of the right greater than left lung apices, likely sequelae of prior infection or inflammation. 6. Coronary artery disease.  Aortic Atherosclerosis (ICD10-I70.0).   Electronically Signed   By: Eddie Candle M.D.   On: 04/26/2020 13:42  ROS - per HPI  OV 08/31/2020  Subjective:  Patient ID: Marcelyn Bruins, male , DOB: 19-Sep-1945 , age 23 y.o. , MRN: [id number] , ADDRESS: [place] [address] PCP [name], MD Patient Care Team: [name], MD as PCP - General (Family Medicine) Brand Males, MD as Consulting Physician (Pulmonary Disease) Danella Sensing, MD as Consulting Physician (Dermatology) Dr. Henreitta Leber, DDS (Dentistry)  This Provider for this visit: Treatment Team:  Attending Provider: Brand Males, MD    08/31/2020 -   Chief Complaint  Patient presents with  . Follow-up    More congestion at the moment, more SOB   Advanced end-stage COPD on oxygen and ECOG 3-4 History of pulmonary embolism on anticoagulation  HPI Lesia Sago II 75 y.o. -presents for follow-up.  Presents with his wife he sitting on wheelchair.  He says for the last few weeks he is having a flareup with increased urine production that is green.  He wants antibiotics.  He says in December 2021 I switched his Omnicef to ciprofloxacin based on Pseudomonas and sensitivity but he actually felt the [place] worked better.  However he said that the duration again was too short and he actually wants Omnicef again for 14 days at this time.  He also thinks a prednisone taper/burst would help him.  Otherwise he continues on Spiriva Symbicort Daliresp 3 times weekly azithromycin and daily prednisone 10 mg.  Is also on oxygen.  He is on anticoagulation for his pulmonary embolism.  He fell down and broke his back but did not have any bleeding.  He is going to have an MRI.  Noted that he is not on any Mucinex and is going to start this after my advice.  We discussed about switching his Breo and Symbicort to a triple inhaler combination either Trelegy or BREZTRI but he says both of not working he does not want to do it.    CT Chest data  No results found.    PFT  No flowsheet data found.     has a past  medical history of Chronic back pain, Chronic rhinitis, Compressed spine fracture ([place]), COPD (chronic obstructive pulmonary disease) ([place]), Emphysema lung ([place]), On home oxygen therapy, Onychomycosis, Orthostatic hypotension, PAD (peripheral artery disease) ([place]), Pneumonia, Pulmonary embolism ([place]) (04/07/2018), Skin cancer, and Vertigo.   reports that he quit smoking about 2 years ago. His smoking use included  pipe and cigarettes. He started smoking about 54 years ago. He has a 104.00 pack-year smoking history. He has never used smokeless tobacco.  Past Surgical History:  Procedure Laterality Date  . ABDOMINAL AORTOGRAM W/LOWER EXTREMITY N/A 01/21/2020   Procedure: ABDOMINAL AORTOGRAM W/LOWER EXTREMITY;  Surgeon: Elam Dutch, MD;  Location: [facility];  Service: Cardiovascular;  Laterality: N/A;  . APPENDECTOMY    . BIOPSY  02/20/2020   Procedure: BIOPSY;  Surgeon: Rush Landmark Telford Nab., MD;  Location: [facility];  Service: Gastroenterology;;  . CATARACT EXTRACTION, BILATERAL    . ENDARTERECTOMY FEMORAL Right 04/19/2019   Procedure: ENDARTERECTOMY RIGHT COMMON FEMORAL;  Surgeon: Elam Dutch, MD;  Location: [facility];  Service: Vascular;  Laterality: Right;  . ENDARTERECTOMY FEMORAL Left 01/24/2020   Procedure: LEFT FEMORAL ENDARTERECTOMY WITH DACRON PATCH ANGIOPLASTY;  Surgeon: Elam Dutch, MD;  [facility];  Service: Vascular;  Laterality: Left;  . ESOPHAGOGASTRODUODENOSCOPY (EGD) WITH PROPOFOL N/A 02/20/2020   Procedure: ESOPHAGOGASTRODUODENOSCOPY (EGD) WITH PROPOFOL;  Surgeon: Irving Copas., MD;  Location: [place];  Service: Gastroenterology;  Laterality: N/A;  . FEMORAL ENDARTERECTOMY Left 01/24/2020  . HEMOSTASIS CLIP PLACEMENT  02/20/2020   Procedure: HEMOSTASIS CLIP PLACEMENT;  Surgeon: Irving Copas., MD;  Location: [place];  Service: Gastroenterology;;  . HOT HEMOSTASIS N/A 02/20/2020   Procedure: HOT HEMOSTASIS (ARGON PLASMA COAGULATION/BICAP);  Surgeon: Irving Copas., MD;  Location: [facility];  Service: Gastroenterology;  Laterality: N/A;  . INSERTION OF ILIAC STENT Left 01/24/2020   Procedure: INSERTION OF VBX STENT 8X59 AND 8X39 LEFT COMMON ILIAC ARTERY. INSERTION OF INNOVA 7 X 60 INNOVA STENT LEFT EXTERNAL ILIAC ARTERY. ;  Surgeon: Elam Dutch, MD;  Location: [facility];  Service: Vascular;  Laterality: Left;  . LOWER  EXTREMITY ANGIOGRAPHY  12/11/2018  . LOWER EXTREMITY ANGIOGRAPHY N/A 12/11/2018   Procedure: LOWER EXTREMITY ANGIOGRAPHY;  Surgeon: Elam Dutch, MD;  Location: [facility];  Service: Cardiovascular;  Laterality: N/A;  . PATCH ANGIOPLASTY Right 04/19/2019   Procedure: Patch Angioplasty;  Surgeon: Elam Dutch, MD;  Location: [facility];  Service: Vascular;  Laterality: Right;  . PERIPHERAL VASCULAR INTERVENTION Right 12/11/2018   Procedure: PERIPHERAL VASCULAR INTERVENTION;  Surgeon: Elam Dutch, MD;  Location: [facility] [facility];  Service: Cardiovascular;  Laterality: Right;  Common Iliac   . SKIN CANCER EXCISION     "lips, face, ears, arms" (04/07/2018)  . TONSILLECTOMY    . ULTRASOUND GUIDANCE FOR VASCULAR ACCESS Right 01/24/2020   Procedure: ULTRASOUND GUIDANCE FOR VASCULAR ACCESS;  Surgeon: Elam Dutch, MD;  Location: [facility];  Service: Vascular;  Laterality: Right;    Allergies  Allergen Reactions  . Tape Other (See Comments)    SKIN IS VERY THIN, TEARS SKIN; CAN ONLY USE COBAN WRAPS DUE TO CONDITION OF SKIN!!  . Ciprofloxacin Nausea Only    Sick on stomach, weak/tired  . Levaquin [Levofloxacin] Other (See Comments)    hallucinations  . Lipitor [Atorvastatin] Rash    Immunization History  Administered Date(s) Administered  . Fluad Quad(high Dose 65+) 03/16/2019, 03/10/2020  . Influenza Split 04/24/2012  . Influenza Whole  05/08/2009, 04/08/2011  . Influenza, High Dose Seasonal PF 05/01/2017, 03/31/2018  . Influenza,inj,Quad PF,6+ Mos 04/27/2013, 04/15/2014, 04/24/2015  . Influenza,inj,quad, With Preservative 05/01/2018  . Influenza-Unspecified 05/13/2016  . PFIZER(Purple Top)SARS-COV-2 Vaccination 07/30/2019, 08/20/2019, 04/07/2020  . Pneumococcal Conjugate-13 02/25/2014  . Pneumococcal Polysaccharide-23 08/15/2011, 12/19/2016  . Pneumococcal-Unspecified 03/08/2017  . Td 02/21/2010  . Tdap 11/05/2017  . Zoster 01/29/2012    Family History  Problem  Relation Age of Onset  . Heart disease Mother        CABG in her 73s, nonsmoker  . Cancer Mother   . Stroke Father   . Heart disease Father        Died of MI at age 11, smoker  . Hepatitis Sister   . Coronary artery disease Other        male 1st degree relative <60  . Colon cancer Neg Hx   . Pancreatic cancer Neg Hx   . Esophageal cancer Neg Hx   . Inflammatory bowel disease Neg Hx   . Liver disease Neg Hx   . Rectal cancer Neg Hx   . Stomach cancer Neg Hx      Current Outpatient Medications:  .  acetaminophen (TYLENOL) 500 MG tablet, Take 1,000 mg by mouth every 6 (six) hours as needed for moderate pain. , Disp: , Rfl:  .  albuterol (VENTOLIN HFA) 108 (90 Base) MCG/ACT inhaler, USE 2 PUFFS EVERY 6 HOURS  AS NEEDED FOR WHEEZING, Disp: 18 g, Rfl: 3 .  apixaban (ELIQUIS) 2.5 MG TABS tablet, Take 1 tablet (2.5 mg total) by mouth 2 (two) times daily., Disp: 180 tablet, Rfl: 3 .  azithromycin (ZITHROMAX) 250 MG tablet, Take 1 tablet (250 mg total) by mouth every Monday, Wednesday, and Friday., Disp: 45 tablet, Rfl: 1 .  budesonide-formoterol (SYMBICORT) 160-4.5 MCG/ACT inhaler, Inhale 2 puffs into the lungs 2 (two) times daily., Disp: , Rfl:  .  Calcium Carbonate-Vitamin D (CALCIUM 600+D PO), Take 2 tablets by mouth daily., Disp: , Rfl:  .  cycloSPORINE (RESTASIS) 0.05 % ophthalmic emulsion, Place 1 drop into both eyes 2 (two) times daily as needed (dry eyes). , Disp: , Rfl:  .  DALIRESP 500 MCG TABS tablet, TAKE 1 TABLET BY MOUTH  DAILY, Disp: 90 tablet, Rfl: 3 .  Ferrous Sulfate (IRON) 325 (65 Fe) MG TABS, Take 1 tablet by mouth daily., Disp: , Rfl:  .  fluticasone (FLONASE) 50 MCG/ACT nasal spray, Place 2 sprays into both nostrils daily., Disp: 48 g, Rfl: 3 .  folic acid (FOLVITE) 1 MG tablet, Take 1 tablet (1 mg total) by mouth daily., Disp: , Rfl:  .  furosemide (LASIX) 20 MG tablet, TAKE 1 TABLET (20 MG TOTAL) BY MOUTH DAILY AS NEEDED FOR FLUID OR EDEMA., Disp: 90 tablet, Rfl: 3 .   HYDROcodone-acetaminophen (NORCO/VICODIN) 5-325 MG tablet, Take 2 tablets by mouth as needed., Disp: , Rfl:  .  Multiple Vitamin (MULTIVITAMIN WITH MINERALS) TABS tablet, Take 1 tablet by mouth daily., Disp: , Rfl:  .  mupirocin cream (BACTROBAN) 2 %, Apply 1 application topically 2 (two) times daily., Disp: 22 g, Rfl: 0 .  OXYGEN, Inhale 4-5 L into the lungs continuous. , Disp: , Rfl:  .  pantoprazole (PROTONIX) 20 MG tablet, Take 1 tablet (20 mg total) by mouth daily., Disp: 90 tablet, Rfl: 1 .  predniSONE (DELTASONE) 10 MG tablet, Take 1 tablet (10 mg total) by mouth daily with breakfast., Disp: 90 tablet, Rfl: 0 .  Respiratory Therapy Supplies (  FLUTTER) DEVI, 10 times Twice a day and prn as needed, may increase if feeling worse, Disp: 1 each, Rfl: 0 .  rosuvastatin (CRESTOR) 10 MG tablet, Take 1 tablet (10 mg total) by mouth daily., Disp: 90 tablet, Rfl: 3 .  sodium chloride HYPERTONIC 3 % nebulizer solution, Take by nebulization daily. (Patient taking differently: Take 4 mLs by nebulization daily as needed (to loosen phlegm).), Disp: 90 mL, Rfl: 12 .  Tiotropium Bromide Monohydrate (SPIRIVA RESPIMAT) 2.5 MCG/ACT AERS, USE 2 INHALATIONS BY MOUTH  ONCE DAILY, Disp: 12 g, Rfl: 3 .  traMADol (ULTRAM) 50 MG tablet, Take 1 tablet (50 mg total) by mouth every 12 (twelve) hours as needed for moderate pain or severe pain., Disp: 90 tablet, Rfl: 1      Objective:   Vitals:   08/31/20 1532  BP: 118/70  Pulse: (!) 111  Temp: (!) 97.4 F (36.3 C)  TempSrc: Oral  SpO2: 100%  Weight: 175 lb (79.4 kg)  Height: 6' (1.829 m)    Estimated body mass index is 23.73 kg/m as calculated from the following:   Height as of this encounter: 6' (1.829 m).   Weight as of this encounter: 175 lb (79.4 kg).  @WEIGHTCHANGE @  [employer]   08/31/20 1532  Weight: 175 lb (79.4 kg)     Physical Exam  General: No distress.  Barrel chested male deconditioned cushingoid sitting in the wheelchair Neuro:  Alert and Oriented x 3. GCS 15. Speech normal Psych: Pleasant Resp:  Barrel Chest - yts.  Wheeze - n, Crackles - yes, No overt respiratory distress CVS: Normal heart sounds. Murmurs - no Ext: Stigmata of Connective Tissue Disease - no HEENT: Normal upper airway. PEERL +. No post nasal drip        Assessment:       ICD-10-CM   1. Chronic respiratory failure with hypoxia (HCC)  J96.11   2. COPD exacerbation ([place])  J44.1   3. COPD, frequent exacerbations ([place])  J44.1   4. Stage 4 very severe COPD by GOLD classification ([place])  J44.9   5. History of pulmonary embolus (PE)  Z86.711        Plan:     Patient Instructions     ICD-10-CM   1. Chronic respiratory failure with hypoxia (HCC)  J96.11   2. COPD exacerbation ([place])  J44.1   3. COPD, frequent exacerbations ([place])  J44.1   4. Stage 4 very severe COPD by GOLD classification ([place])  J44.9   5. History of pulmonary embolus (PE)  Z86.711     Currently having another exacerbation despite optimal COPD care/treatment  Plan - Please take Take prednisone 40mg  once daily x 3 days, then 30mg  once daily x 3 days, then 20mg  once daily x 3 days, then prednisone 10mg  once daily basekube to continue   -Start omnicef 300mg  bid x 14 days per your request (hold azithro during this time)  -Continue oxygen, Spiriva, Symbicort, Daliresp and 3 times weekly azithromycin and daily prednisone otherwise [during flareups hold azithromycin and during flareup to a different schedule of prednisone as above]  -Continue anticoagulation  - strt mucinex  -   Sleep with head end of the bed elevated and feet  Follow-up -3 months or sooner with CAT score at follow-up print   Discussed goals of care and CODE STATUS and concurrent palliative care follow-up  SIGNATURE    Dr. Brand Males, M.D., F.C.C.P,  Pulmonary and Critical Care Medicine Staff Physician, [facility]  Director - Interstitial Lung Disease  Program  Pulmonary  [place] at [place], [place], [address]  Pager: [phone], If no answer or between  15:00h - 7:00h: call [phone] Telephone: [phone]  4:36 PM 08/31/2020

## 2020-09-01 ENCOUNTER — Telehealth: Payer: Self-pay | Admitting: Internal Medicine

## 2020-09-01 ENCOUNTER — Other Ambulatory Visit: Payer: Self-pay | Admitting: Internal Medicine

## 2020-09-01 MED ORDER — PREDNISONE 10 MG PO TABS: ORAL_TABLET | ORAL | 0 refills | Status: AC

## 2020-09-01 MED ORDER — PREDNISONE 10 MG PO TABS: ORAL_TABLET | ORAL | 0 refills | Status: DC

## 2020-09-01 NOTE — Addendum Note (Signed)
Addended by: Lorretta Harp on: 09/01/2020 12:35 PM   Modules accepted: Orders

## 2020-09-01 NOTE — Telephone Encounter (Signed)
Spoke with patient's wife. She stated that [facility] had received the prednisone dose[facility] but it did not have any instructions. I advised her that I will cancel the dosepak that sent in and send in the correct prednisone taper. She verbalized understanding.   [facility]ed [facility] and spoke with [employer]. The dosepak has been cancelled.   Nothing further needed at time of call.

## 2020-09-04 ENCOUNTER — Telehealth: Payer: Self-pay | Admitting: Internal Medicine

## 2020-09-04 MED ORDER — CEFDINIR 300 MG PO CAPS: 300.0000 mg | ORAL_CAPSULE | Freq: Two times a day (BID) | ORAL | 0 refills | Status: DC

## 2020-09-04 NOTE — Telephone Encounter (Signed)
Spoke with the pt's spouse  She states pharm only dispensed #14 caps of onmicef and MR wanted pt to take bid x 14 days  It looks like we did send #28 caps  I called pharm and they stated that what they received was for #14  I sent new rx for #14 since the pt already picked up the other  Spouse aware  Nothing further needed

## 2020-09-06 ENCOUNTER — Other Ambulatory Visit: Payer: Self-pay

## 2020-09-06 ENCOUNTER — Inpatient Hospital Stay: Payer: Medicare Other | Attending: Physician Assistant

## 2020-09-06 VITALS — BP 122/72 | HR 78 | Temp 98.2°F | Resp 18

## 2020-09-06 DIAGNOSIS — I739 Peripheral vascular disease, unspecified: Secondary | ICD-10-CM | POA: Insufficient documentation

## 2020-09-06 DIAGNOSIS — D500 Iron deficiency anemia secondary to blood loss (chronic): Secondary | ICD-10-CM | POA: Diagnosis not present

## 2020-09-06 DIAGNOSIS — K922 Gastrointestinal hemorrhage, unspecified: Secondary | ICD-10-CM | POA: Diagnosis not present

## 2020-09-06 DIAGNOSIS — D649 Anemia, unspecified: Secondary | ICD-10-CM

## 2020-09-06 MED ORDER — ACETAMINOPHEN 325 MG PO TABS
650.0000 mg | ORAL_TABLET | Freq: Once | ORAL | Status: AC
  Administered 2020-09-06: 650 mg via ORAL

## 2020-09-06 MED ORDER — DIPHENHYDRAMINE HCL 25 MG PO CAPS
25.0000 mg | ORAL_CAPSULE | Freq: Once | ORAL | Status: AC
  Administered 2020-09-06: 25 mg via ORAL

## 2020-09-06 MED ORDER — SODIUM CHLORIDE 0.9 % IV SOLN
200.0000 mg | Freq: Once | INTRAVENOUS | Status: AC
  Administered 2020-09-06: 200 mg via INTRAVENOUS
  Filled 2020-09-06: qty 200

## 2020-09-06 NOTE — Telephone Encounter (Signed)
Encounter opened in error.   Will close message.

## 2020-09-06 NOTE — Progress Notes (Signed)
Pt discharged in no apparent distress. Pt left by wheelchair as he arrived .  Pt aware of discharge instructions and verbalized understanding and had no further questions.

## 2020-09-10 ENCOUNTER — Other Ambulatory Visit: Payer: Medicare Other

## 2020-09-11 ENCOUNTER — Other Ambulatory Visit: Payer: Self-pay | Admitting: Family Medicine

## 2020-09-13 ENCOUNTER — Other Ambulatory Visit: Payer: Self-pay | Admitting: Physician Assistant

## 2020-09-13 ENCOUNTER — Telehealth: Payer: Self-pay | Admitting: Internal Medicine

## 2020-09-13 ENCOUNTER — Inpatient Hospital Stay: Payer: Medicare Other

## 2020-09-13 ENCOUNTER — Other Ambulatory Visit: Payer: Self-pay

## 2020-09-13 VITALS — BP 143/85 | HR 97 | Temp 97.7°F | Resp 18

## 2020-09-13 DIAGNOSIS — D500 Iron deficiency anemia secondary to blood loss (chronic): Secondary | ICD-10-CM | POA: Diagnosis not present

## 2020-09-13 DIAGNOSIS — Z862 Personal history of diseases of the blood and blood-forming organs and certain disorders involving the immune mechanism: Secondary | ICD-10-CM

## 2020-09-13 DIAGNOSIS — D649 Anemia, unspecified: Secondary | ICD-10-CM

## 2020-09-13 MED ORDER — ACETAMINOPHEN 325 MG PO TABS
ORAL_TABLET | ORAL | Status: AC
  Filled 2020-09-13: qty 2

## 2020-09-13 MED ORDER — SODIUM CHLORIDE 0.9 % IV SOLN
200.0000 mg | Freq: Once | INTRAVENOUS | Status: AC
  Administered 2020-09-13: 200 mg via INTRAVENOUS
  Filled 2020-09-13: qty 200

## 2020-09-13 MED ORDER — ACETAMINOPHEN 325 MG PO TABS
650.0000 mg | ORAL_TABLET | Freq: Once | ORAL | Status: AC
  Administered 2020-09-13: 650 mg via ORAL

## 2020-09-13 MED ORDER — SODIUM CHLORIDE 0.9 % IV SOLN
Freq: Once | INTRAVENOUS | Status: AC
  Filled 2020-09-13: qty 250

## 2020-09-13 MED ORDER — DIPHENHYDRAMINE HCL 25 MG PO CAPS
ORAL_CAPSULE | ORAL | Status: AC
  Filled 2020-09-13: qty 1

## 2020-09-13 MED ORDER — DIPHENHYDRAMINE HCL 25 MG PO CAPS
25.0000 mg | ORAL_CAPSULE | Freq: Once | ORAL | Status: AC
  Administered 2020-09-13: 25 mg via ORAL

## 2020-09-13 NOTE — Patient Instructions (Signed)
Iron Deficiency Anemia, Adult Iron deficiency anemia is when you do not have enough red blood cells or hemoglobin in your blood. This happens because you have too little iron in your body. Hemoglobin carries oxygen to parts of the body. Anemia can cause your body to not get enough oxygen. What are the causes?  Not eating enough foods that have iron in them.  The body not being able to take in iron well.  Needing more iron due to pregnancy or heavy menstrual periods, for females.  Cancer.  Bleeding in the bowels.  Many blood draws. What increases the risk?  Being pregnant.  Being a teenage girl going through a growth spurt. What are the signs or symptoms?  Pale skin, lips, and nails.  Weakness, dizziness, and getting tired easily.  Headache.  Feeling like you cannot breathe well when moving (shortness of breath).  Cold hands and feet.  Fast heartbeat or a heartbeat that is not regular.  Feeling grouchy (irritable) or breathing fast. These are more common in very bad anemia. Mild anemia may not cause any symptoms. How is this treated? This condition is treated by finding out why you do not have enough iron and then getting more iron. It may include:  Adding foods to your diet that have a lot of iron.  Taking iron pills (supplements). If you are pregnant or breastfeeding, you may need to take extra iron. Your diet often does not provide the amount of iron that you need.  Getting more vitamin C in your diet. Vitamin C helps your body take in iron. You may need to take iron pills with a glass of orange juice or vitamin C pills.  Medicines to make heavy menstrual periods lighter.  Surgery. You may need blood tests to see if treatment is working. If the treatment does not seem to be working, you may need more tests. Follow these instructions at home: Medicines  Take over-the-counter and prescription medicines only as told by your doctor. This includes iron pills and  vitamins. ? Take iron pills when your stomach is empty. If you cannot handle this, take them with food. ? Do not drink milk or take antacids at the same time as your iron pills. ? Iron pills may turn your poop (stool)black.  If you cannot handle taking iron pills by mouth, ask your doctor about getting iron through: ? An IV tube. ? A shot (injection) into a muscle. Eating and drinking  Talk with your doctor before changing the foods you eat. He or she may tell you to eat foods that have a lot of iron, such as: ? Liver. ? Low-fat (lean) beef. ? Breads and cereals that have iron added to them. ? Eggs. ? Dried fruit. ? Dark green, leafy vegetables.  Eat fresh fruits and vegetables that are high in vitamin C. They help your body use iron. Foods with a lot of vitamin C include: ? Oranges. ? Peppers. ? Tomatoes. ? Mangoes.  Drink enough fluid to keep your pee (urine) pale yellow.   Managing constipation If you are taking iron pills, they may cause trouble pooping (constipation). To prevent or treat trouble pooping, you may need to:  Take over-the-counter or prescription medicines.  Eat foods that are high in fiber. These include beans, whole grains, and fresh fruits and vegetables.  Limit foods that are high in fat and sugar. These include fried or sweet foods. General instructions  Return to your normal activities as told by your doctor.   Ask your doctor what activities are safe for you.  Keep yourself clean, and keep things clean around you.  Keep all follow-up visits as told by your doctor. This is important. Contact a doctor if:  You feel like you may vomit (nauseous), or you vomit.  You feel weak.  You are sweating for no reason.  You have trouble pooping, such as: ? Pooping less than 3 times a week. ? Straining to poop. ? Having poop that is hard, dry, or larger than normal. ? Feeling full or bloated. ? Pain in the lower belly. ? Not feeling better after  pooping. Get help right away if:  You pass out (faint).  You have chest pain.  You have trouble breathing that: ? Is very bad. ? Gets worse with physical activity.  You have a fast heartbeat, or a heartbeat that does not feel regular.  You get light-headed when getting up from sitting or lying down. These symptoms may be an emergency. Do not wait to see if the symptoms will go away. Get medical help right away. Call your local emergency services (911 in the [place] Do not drive yourself to the hospital. Summary  Iron deficiency anemia is when you have too little iron in your body.  This condition is treated by finding out why you do not have enough iron in your body and then getting more iron.  Take over-the-counter and prescription medicines only as told by your doctor.  Eat fresh fruits and vegetables that are high in vitamin C.  Get help right away if you cannot breathe well. This information is not intended to replace advice given to you by your health care provider. Make sure you discuss any questions you have with your health care provider. Document Revised: 03/02/2019 Document Reviewed: 03/02/2019 Elsevier Patient Education  2021 [employer]vier Inc.  

## 2020-09-13 NOTE — Telephone Encounter (Signed)
Left message with added lab appointment per 3/9 schedule message. Gave option to call back to reschedule if needed.

## 2020-09-20 ENCOUNTER — Other Ambulatory Visit: Payer: Self-pay

## 2020-09-20 ENCOUNTER — Inpatient Hospital Stay: Payer: Medicare Other

## 2020-09-20 VITALS — BP 157/81 | HR 95 | Temp 99.0°F | Resp 18

## 2020-09-20 DIAGNOSIS — D500 Iron deficiency anemia secondary to blood loss (chronic): Secondary | ICD-10-CM | POA: Diagnosis not present

## 2020-09-20 DIAGNOSIS — D649 Anemia, unspecified: Secondary | ICD-10-CM

## 2020-09-20 DIAGNOSIS — Z862 Personal history of diseases of the blood and blood-forming organs and certain disorders involving the immune mechanism: Secondary | ICD-10-CM

## 2020-09-20 LAB — CMP (CANCER CENTER ONLY)
ALT: 22 U/L (ref 0–44)
AST: 26 U/L (ref 15–41)
Albumin: 3.7 g/dL (ref 3.5–5.0)
Alkaline Phosphatase: 58 U/L (ref 38–126)
Anion gap: 8 (ref 5–15)
BUN: 12 mg/dL (ref 8–23)
CO2: 30 mmol/L (ref 22–32)
Calcium: 9.2 mg/dL (ref 8.9–10.3)
Chloride: 100 mmol/L (ref 98–111)
Creatinine: 1.06 mg/dL (ref 0.61–1.24)
GFR, Estimated: >60 mL/min (ref >60)
Glucose, Bld: 100 mg/dL — ABNORMAL HIGH (ref 70–99)
Potassium: 4.3 mmol/L (ref 3.5–5.1)
Sodium: 138 mmol/L (ref 135–145)
Total Bilirubin: 0.4 mg/dL (ref 0.3–1.2)
Total Protein: 6.9 g/dL (ref 6.5–8.1)

## 2020-09-20 MED ORDER — SODIUM CHLORIDE 0.9 % IV SOLN
200.0000 mg | Freq: Once | INTRAVENOUS | Status: AC
  Administered 2020-09-20: 200 mg via INTRAVENOUS
  Filled 2020-09-20: qty 200

## 2020-09-20 MED ORDER — ACETAMINOPHEN 325 MG PO TABS
ORAL_TABLET | ORAL | Status: AC
  Filled 2020-09-20: qty 2

## 2020-09-20 MED ORDER — SODIUM CHLORIDE 0.9 % IV SOLN
Freq: Once | INTRAVENOUS | Status: AC
  Filled 2020-09-20: qty 250

## 2020-09-20 MED ORDER — DIPHENHYDRAMINE HCL 25 MG PO CAPS
ORAL_CAPSULE | ORAL | Status: AC
  Filled 2020-09-20: qty 1

## 2020-09-20 MED ORDER — ACETAMINOPHEN 325 MG PO TABS
650.0000 mg | ORAL_TABLET | Freq: Once | ORAL | Status: AC
  Administered 2020-09-20: 650 mg via ORAL

## 2020-09-20 MED ORDER — DIPHENHYDRAMINE HCL 25 MG PO CAPS
25.0000 mg | ORAL_CAPSULE | Freq: Once | ORAL | Status: AC
Start: 2020-09-20 — End: 2020-09-20
  Administered 2020-09-20: 25 mg via ORAL

## 2020-09-20 NOTE — Patient Instructions (Signed)

## 2020-09-20 NOTE — Progress Notes (Signed)
Patient declined to wait 30 minute post observation period, stating he has not had any issues in the past. Vital signs obtained and stable to discharge. IV catheter removed and intact. Patient wheeled to lobby to go home with family.

## 2020-09-21 ENCOUNTER — Ambulatory Visit (INDEPENDENT_AMBULATORY_CARE_PROVIDER_SITE_OTHER): Payer: Medicare Other | Admitting: Vascular Surgery

## 2020-09-21 ENCOUNTER — Ambulatory Visit (HOSPITAL_COMMUNITY)
Admission: RE | Admit: 2020-09-21 | Discharge: 2020-09-21 | Disposition: A | Discharge status: Home / Self Care | Payer: Medicare Other | Source: Ambulatory Visit | Attending: Vascular Surgery | Admitting: Vascular Surgery

## 2020-09-21 ENCOUNTER — Encounter: Payer: Self-pay | Admitting: Vascular Surgery

## 2020-09-21 VITALS — BP 155/92 | HR 110 | Temp 97.2°F | Resp 20 | Ht 72.0 in | Wt 175.0 lb

## 2020-09-21 DIAGNOSIS — I739 Peripheral vascular disease, unspecified: Secondary | ICD-10-CM | POA: Insufficient documentation

## 2020-09-21 NOTE — Progress Notes (Signed)
Patient is a 75 year old male who returns for follow-up today.  He is previously undergone bilateral common femoral endarterectomy.  Right side was in October 2020 left side in July 2021.  He has fairly significant underlying COPD and wears home oxygen.  He recently had a wound on his left toe that has essentially healed at this point.  He is continuing to do soaks on this and washing it daily with soap and water.  Has no drainage from the toe.  He has no fever or chills.  Review of systems: He has shortness of breath with minimal exertion.  He does not have chest pain.  Physical exam:  Vitals:   09/21/20 1334  BP: (!) 155/92  Pulse: (!) 110  Resp: 20  Temp: (!) 97.2 F (36.2 C)  SpO2: 94%  Weight: 175 lb (79.4 kg)  Height: 6' (1.829 m)    Extremities: Bluish discoloration both feet both feet are pink and warm no palpable pedal pulses but edematous from the knee down to the foot  Skin: Dry eschar wound plantar aspect left first toe 3 x 1 cm  Data: Patient had bilateral ABIs performed today which were 0.96 bilaterally.  Right toe pressure was 125 left toe pressure was 87  Assessment: Healing wound left first toe has adequate perfusion to both lower extremities for wound healing capacity at this point.  Plan: Patient will continue his local wound care and protect feet on both sides.  He will follow up with [place] in 1 year with repeat ABIs.  Ruta Hinds, MD [facility] Office: [phone]

## 2020-10-04 ENCOUNTER — Inpatient Hospital Stay: Payer: Medicare Other

## 2020-10-04 ENCOUNTER — Other Ambulatory Visit: Payer: Self-pay

## 2020-10-04 ENCOUNTER — Telehealth: Payer: Self-pay | Admitting: Internal Medicine

## 2020-10-04 ENCOUNTER — Inpatient Hospital Stay (HOSPITAL_BASED_OUTPATIENT_CLINIC_OR_DEPARTMENT_OTHER): Payer: Medicare Other | Admitting: Internal Medicine

## 2020-10-04 VITALS — BP 128/80 | HR 107 | Temp 98.0°F | Resp 17 | Wt 176.2 lb

## 2020-10-04 DIAGNOSIS — D500 Iron deficiency anemia secondary to blood loss (chronic): Secondary | ICD-10-CM | POA: Diagnosis not present

## 2020-10-04 DIAGNOSIS — D649 Anemia, unspecified: Secondary | ICD-10-CM

## 2020-10-04 LAB — CMP (CANCER CENTER ONLY)
ALT: 26 U/L (ref 0–44)
AST: 27 U/L (ref 15–41)
Albumin: 3.8 g/dL (ref 3.5–5.0)
Alkaline Phosphatase: 58 U/L (ref 38–126)
Anion gap: 12 (ref 5–15)
BUN: 25 mg/dL — ABNORMAL HIGH (ref 8–23)
CO2: 30 mmol/L (ref 22–32)
Calcium: 9.6 mg/dL (ref 8.9–10.3)
Chloride: 98 mmol/L (ref 98–111)
Creatinine: 1.37 mg/dL — ABNORMAL HIGH (ref 0.61–1.24)
GFR, Estimated: 54 mL/min — ABNORMAL LOW (ref >60)
Glucose, Bld: 119 mg/dL — ABNORMAL HIGH (ref 70–99)
Potassium: 5 mmol/L (ref 3.5–5.1)
Sodium: 140 mmol/L (ref 135–145)
Total Bilirubin: 0.4 mg/dL (ref 0.3–1.2)
Total Protein: 7.2 g/dL (ref 6.5–8.1)

## 2020-10-04 LAB — CBC WITH DIFFERENTIAL (CANCER CENTER ONLY)
Abs Immature Granulocytes: 0.41 10*3/uL — ABNORMAL HIGH (ref 0.00–0.07)
Basophils Absolute: 0.1 10*3/uL (ref 0.0–0.1)
Basophils Relative: 1 %
Eosinophils Absolute: 0 10*3/uL (ref 0.0–0.5)
Eosinophils Relative: 0 %
HCT: 39.4 % (ref 39.0–52.0)
Hemoglobin: 13.5 g/dL (ref 13.0–17.0)
Immature Granulocytes: 4 %
Lymphocytes Relative: 10 %
Lymphs Abs: 0.9 10*3/uL (ref 0.7–4.0)
MCH: 31.5 pg (ref 26.0–34.0)
MCHC: 34.3 g/dL (ref 30.0–36.0)
MCV: 91.8 fL (ref 80.0–100.0)
Monocytes Absolute: 0.9 10*3/uL (ref 0.1–1.0)
Monocytes Relative: 10 %
Neutro Abs: 7 10*3/uL (ref 1.7–7.7)
Neutrophils Relative %: 75 %
Platelet Count: 206 10*3/uL (ref 150–400)
RBC: 4.29 MIL/uL (ref 4.22–5.81)
RDW: 15.4 % (ref 11.5–15.5)
WBC Count: 9.4 10*3/uL (ref 4.0–10.5)
nRBC: 0 % (ref 0.0–0.2)

## 2020-10-04 NOTE — Progress Notes (Signed)
[place] [phone]   [phone]  OFFICE PROGRESS NOTE  [name], MD [address] 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  PRIOR THERAPY: Iron infusion with Venofer weekly for 4 weeks.  Last dose was given September 20, 2020  CURRENT THERAPY: None  INTERVAL HISTORY: Francisco [name] 75 y.o. male returns to the clinic today for follow-up visit accompanied by his wife.  The patient is feeling much better today with less fatigue.  He continues to have the baseline shortness of breath and currently on home oxygen.  He denied having any current chest pain, cough or hemoptysis.  He denied having any nausea, vomiting, diarrhea or constipation.  He has no headache or visual changes.  He has no weight loss or night sweats.  He is scheduled to see his gastroenterologist soon for reevaluation. The patient received 4 doses of Venofer infusion and he tolerated it fairly well.  He is here today for evaluation and repeat CBC, iron study and ferritin.  MEDICAL HISTORY: Past Medical History:  Diagnosis Date  . Chronic back pain    "mid and lower" (04/07/2018)  . Chronic rhinitis    -Sinus Ct 08/01/2009 >> Bilateral maxillary sinusitis with some mucosal thickeningin the sphenoid and frontal sinuses as well with air fluid levels present -chronic rhinitis flyer Aug 04, 2009  . Compressed spine fracture ([place])   . COPD (chronic obstructive pulmonary disease) [facility])    PFT's rec Jul 17, 2009  . Emphysema lung ([place])   . On home oxygen therapy    "3L; 24/7" (04/07/2018)  . Onychomycosis    Dr. Judi Cong  . Orthostatic hypotension    "since 10/2017" (04/07/2018)  . PAD (peripheral artery disease) ([place])   . Pneumonia    "twice in 1 year" (04/07/2018)  . Pulmonary embolism ([place]) 04/07/2018  . Skin cancer    "lips, face, ears, arms" (04/07/2018)  . Vertigo    "since ~ 02/2018" (04/07/2018)     ALLERGIES:  is allergic to tape, ciprofloxacin, levaquin [levofloxacin], and lipitor [atorvastatin].  MEDICATIONS:  Current Outpatient Medications  Medication Sig Dispense Refill  . acetaminophen (TYLENOL) 500 MG tablet Take 1,000 mg by mouth every 6 (six) hours as needed for moderate pain.     [name] Kitchen albuterol (VENTOLIN HFA) 108 (90 Base) MCG/ACT inhaler USE 2 PUFFS EVERY 6 HOURS  AS NEEDED FOR WHEEZING 18 g 3  . apixaban (ELIQUIS) 2.5 MG TABS tablet Take 1 tablet (2.5 mg total) by mouth 2 (two) times daily. 180 tablet 3  . azithromycin (ZITHROMAX) 250 MG tablet Take 1 tablet (250 mg total) by mouth every Monday, Wednesday, and Friday. 45 tablet 1  . Calcium Carbonate-Vitamin D (CALCIUM 600+D PO) Take 2 tablets by mouth daily.    . cycloSPORINE (RESTASIS) 0.05 % ophthalmic emulsion Place 1 drop into both eyes 2 (two) times daily as needed (dry eyes).     . DALIRESP 500 MCG TABS tablet TAKE 1 TABLET BY MOUTH  DAILY 90 tablet 3  . Ferrous Sulfate (IRON) 325 (65 Fe) MG TABS Take 1 tablet by mouth daily.    . fluticasone (FLONASE) 50 MCG/ACT nasal spray Place 2 sprays into both nostrils daily. 48 g 3  . folic acid (FOLVITE) 1 MG tablet Take 1 tablet (1 mg total) by mouth daily.    . furosemide (LASIX) 20 MG tablet TAKE 1 TABLET (20 MG TOTAL) BY  MOUTH DAILY AS NEEDED FOR FLUID OR EDEMA. 90 tablet 3  . HYDROcodone-acetaminophen (NORCO/VICODIN) 5-325 MG tablet Take 2 tablets by mouth as needed.    . Multiple Vitamin (MULTIVITAMIN WITH MINERALS) TABS tablet Take 1 tablet by mouth daily.    . mupirocin cream (BACTROBAN) 2 % Apply 1 application topically 2 (two) times daily. 22 g 0  . OXYGEN Inhale 4-5 L into the lungs continuous.     . pantoprazole (PROTONIX) 20 MG tablet Take 1 tablet (20 mg total) by mouth daily. 90 tablet 1  . predniSONE (DELTASONE) 10 MG tablet Take 1 tablet (10 mg total) by mouth daily with breakfast. 90 tablet 0  . Respiratory Therapy Supplies (FLUTTER) DEVI 10 times Twice a day  and prn as needed, may increase if feeling worse 1 each 0  . rosuvastatin (CRESTOR) 10 MG tablet Take 1 tablet (10 mg total) by mouth daily. 90 tablet 3  . sodium chloride HYPERTONIC 3 % nebulizer solution Take by nebulization daily. (Patient taking differently: Take 4 mLs by nebulization daily as needed (to loosen phlegm).) 90 mL 12  . SYMBICORT 160-4.5 MCG/ACT inhaler USE 2 INHALATIONS BY MOUTH  TWICE DAILY 30.6 g 3  . Tiotropium Bromide Monohydrate (SPIRIVA RESPIMAT) 2.5 MCG/ACT AERS USE 2 INHALATIONS BY MOUTH&nbsp;&nbsp;ONCE DAILY 12 g 3  . traMADol (ULTRAM) 50 MG tablet Take 1 tablet (50 mg total) by mouth every 12 (twelve) hours as needed for moderate pain or severe pain. 90 tablet 1   No current facility-administered medications for this visit.    SURGICAL HISTORY:  Past Surgical History:  Procedure Laterality Date  . ABDOMINAL AORTOGRAM W/LOWER EXTREMITY N/A 01/21/2020   Procedure: ABDOMINAL AORTOGRAM W/LOWER EXTREMITY;  Surgeon: Elam Dutch, MD;  Location: [place] [facility];  Service: Cardiovascular;  Laterality: N/A;  . APPENDECTOMY    . BIOPSY  02/20/2020   Procedure: BIOPSY;  Surgeon: Rush Landmark Telford Nab., MD;  Location: [place];  Service: Gastroenterology;;  . CATARACT EXTRACTION, BILATERAL    . ENDARTERECTOMY FEMORAL Right 04/19/2019   Procedure: ENDARTERECTOMY RIGHT COMMON FEMORAL;  Surgeon: Elam Dutch, MD;  Location: [place];  Service: Vascular;  Laterality: Right;  . ENDARTERECTOMY FEMORAL Left 01/24/2020   Procedure: LEFT FEMORAL ENDARTERECTOMY WITH DACRON PATCH ANGIOPLASTY;  Surgeon: Elam Dutch, MD;  [facility];  Service: Vascular;  Laterality: Left;  . ESOPHAGOGASTRODUODENOSCOPY (EGD) WITH PROPOFOL N/A 02/20/2020   Procedure: ESOPHAGOGASTRODUODENOSCOPY (EGD) WITH PROPOFOL;  Surgeon: Irving Copas., MD;  Location: [place];  Service: Gastroenterology;  Laterality: N/A;  . FEMORAL ENDARTERECTOMY Left 01/24/2020  . HEMOSTASIS CLIP  PLACEMENT  02/20/2020   Procedure: HEMOSTASIS CLIP PLACEMENT;  Surgeon: Irving Copas., MD;  Location: [place];  Service: Gastroenterology;;  . HOT HEMOSTASIS N/A 02/20/2020   Procedure: HOT HEMOSTASIS (ARGON PLASMA COAGULATION/BICAP);  Surgeon: Irving Copas., MD;  Location: [place];  Service: Gastroenterology;  Laterality: N/A;  . INSERTION OF ILIAC STENT Left 01/24/2020   Procedure: INSERTION OF VBX STENT 8X59 AND 8X39 LEFT COMMON ILIAC ARTERY. INSERTION OF INNOVA 7 X 60 INNOVA STENT LEFT EXTERNAL ILIAC ARTERY. ;  Surgeon: Elam Dutch, MD;  Location: [place];  Service: Vascular;  Laterality: Left;  . LOWER EXTREMITY ANGIOGRAPHY  12/11/2018  . LOWER EXTREMITY ANGIOGRAPHY N/A 12/11/2018   Procedure: LOWER EXTREMITY ANGIOGRAPHY;  Surgeon: Elam Dutch, MD;  Location: [place] [facility];  Service: Cardiovascular;  Laterality: N/A;  . PATCH ANGIOPLASTY Right 04/19/2019   Procedure: Patch Angioplasty;  Surgeon: Elam Dutch, MD;  Location: [place];  Service: Vascular;  Laterality: Right;  . PERIPHERAL VASCULAR INTERVENTION Right 12/11/2018   Procedure: PERIPHERAL VASCULAR INTERVENTION;  Surgeon: Elam Dutch, MD;  Location: [place] [facility];  Service: Cardiovascular;  Laterality: Right;  Common Iliac   . SKIN CANCER EXCISION     "lips, face, ears, arms" (04/07/2018)  . TONSILLECTOMY    . ULTRASOUND GUIDANCE FOR VASCULAR ACCESS Right 01/24/2020   Procedure: ULTRASOUND GUIDANCE FOR VASCULAR ACCESS;  Surgeon: Elam Dutch, MD;  Location: [facility] OR;  Service: Vascular;  Laterality: Right;    REVIEW OF SYSTEMS:  A comprehensive review of systems was negative except for: Respiratory: positive for dyspnea on exertion   PHYSICAL EXAMINATION: General appearance: alert, cooperative and no distress Head: Normocephalic, without obvious abnormality, atraumatic Neck: no adenopathy, no JVD, supple, symmetrical, trachea midline and thyroid not enlarged, symmetric, no  tenderness/mass/nodules Lymph nodes: Cervical, supraclavicular, and axillary nodes normal. Resp: clear to auscultation bilaterally Back: symmetric, no curvature. ROM normal. No CVA tenderness. Cardio: regular rate and rhythm, S1, S2 normal, no murmur, click, rub or gallop GI: soft, non-tender; bowel sounds normal; no masses,  no organomegaly Extremities: extremities normal, atraumatic, no cyanosis or edema  ECOG PERFORMANCE STATUS: 1 - Symptomatic but completely ambulatory  Blood pressure 128/80, pulse (!) 107, temperature 98 F (36.7 C), temperature source Tympanic, resp. rate 17, weight 176 lb 3.2 oz (79.9 kg), SpO2 96 %.  LABORATORY DATA: Lab Results  Component Value Date   WBC 9.4 10/04/2020   HGB 13.5 10/04/2020   HCT 39.4 10/04/2020   MCV 91.8 10/04/2020   PLT 206 10/04/2020      Chemistry      Component Value Date/Time   NA 138 09/20/2020 0835   NA 140 03/02/2020 0000   K 4.3 09/20/2020 0835   CL 100 09/20/2020 0835   CO2 30 09/20/2020 0835   BUN 12 09/20/2020 0835   BUN 14 03/02/2020 0000   CREATININE 1.06 09/20/2020 0835   CREATININE 1.14 07/06/2020 0941   GLU 90 03/02/2020 0000      Component Value Date/Time   CALCIUM 9.2 09/20/2020 0835   ALKPHOS 58 09/20/2020 0835   AST 26 09/20/2020 0835   ALT 22 09/20/2020 0835   BILITOT 0.4 09/20/2020 0835       RADIOGRAPHIC STUDIES: VAS [place] ABI WITH/WO TBI  Result Date: 09/21/2020 LOWER EXTREMITY DOPPLER STUDY Indications: Claudication, rest pain, and peripheral artery disease. High Risk Factors: Hypertension, hyperlipidemia. Other Factors: Follow up about left toe wound. Per patient the toe is healing                now and much better than last visit in Dec 2021.  Vascular Interventions: Right femoral endarterectomy 04/19/2019                         Right common iliac stent 12/11/2018. Comparison Study: 01/12/20 Performing Technologist: June Leap RDMS, RVT  Examination Guidelines: A complete evaluation includes at  minimum, Doppler waveform signals and systolic blood pressure reading at the level of bilateral brachial, anterior tibial, and posterior tibial arteries, when vessel segments are accessible. Bilateral testing is considered an integral part of a complete examination. Photoelectric Plethysmograph (PPG) waveforms and toe systolic pressure readings are included as required and additional duplex testing as needed. Limited examinations for reoccurring indications may be performed as noted.  ABI Findings: +---------+------------------+-----+---------+--------+ Right    Rt Pressure (  mmHg)IndexWaveform Comment  +---------+------------------+-----+---------+--------+ Brachial 179                                      +---------+------------------+-----+---------+--------+ ATA      161               0.89 biphasic          +---------+------------------+-----+---------+--------+ PTA      171               0.95 triphasic         +---------+------------------+-----+---------+--------+ Great Toe125               0.69 Normal            +---------+------------------+-----+---------+--------+ +---------+------------------+-----+---------+-------+ Left     Lt Pressure (mmHg)IndexWaveform Comment +---------+------------------+-----+---------+-------+ Brachial 180                                     +---------+------------------+-----+---------+-------+ ATA      156               0.87 biphasic         +---------+------------------+-----+---------+-------+ PTA      173               0.96 triphasic        +---------+------------------+-----+---------+-------+ Great Toe87                0.48 Abnormal         +---------+------------------+-----+---------+-------+ +-------+-----------+-----------+------------+------------+ ABI/TBIToday's ABIToday's TBIPrevious ABIPrevious TBI +-------+-----------+-----------+------------+------------+ Right  0.95       0.69       0.94         0.54         +-------+-----------+-----------+------------+------------+ Left   0.96       0.48       1.03        ------------ +-------+-----------+-----------+------------+------------+  Summary: Right: Resting right ankle-brachial index is within normal range. No evidence of significant right lower extremity arterial disease. The right toe-brachial index is normal. Left: Resting left ankle-brachial index is within normal range. No evidence of significant left lower extremity arterial disease. The left toe-brachial index is abnormal.  *See table(s) above for measurements and observations.  Electronically signed by Ruta Hinds MD on 09/21/2020 at 1:40:35 PM.    Final     ASSESSMENT AND PLAN: This is a very pleasant 75 years old white male with history of microcytic anemia secondary to iron deficiency from chronic gastrointestinal bleeding.  The patient is status post treatment with iron infusion with Venofer for 4 weekly doses and he tolerated his treatment well. Repeat CBC today showed significant improvement in his hemoglobin and hematocrit.  The iron study and ferritin are still pending. I recommended for the patient to continue on observation for now with repeat CBC, iron study and ferritin in 3 months. He is also scheduled to see his gastroenterologist Dr. Rush Landmark for reevaluation. He was advised to call immediately if he has any other concerning symptoms in the interval. The patient voices understanding of current disease status and treatment options and is in agreement with the current care plan.  All questions were answered. The patient knows to call the clinic with any problems, questions or concerns. We can certainly see the patient much sooner if necessary.  The total time spent in the appointment  was 20 minutes.  Disclaimer: This note was dictated with voice recognition software. Similar sounding words can inadvertently be transcribed and may not be corrected upon review.

## 2020-10-04 NOTE — Telephone Encounter (Signed)
Scheduled per los. Gave avs and calendar  

## 2020-10-05 LAB — IRON AND TIBC
Iron: 66 ug/dL (ref 42–163)
Saturation Ratios: 19 % — ABNORMAL LOW (ref 20–55)
TIBC: 340 ug/dL (ref 202–409)
UIBC: 274 ug/dL (ref 117–376)

## 2020-10-05 LAB — FERRITIN: Ferritin: 412 ng/mL — ABNORMAL HIGH (ref 24–336)

## 2020-10-19 ENCOUNTER — Other Ambulatory Visit: Payer: Self-pay | Admitting: Internal Medicine

## 2020-10-19 ENCOUNTER — Other Ambulatory Visit: Payer: Self-pay | Admitting: Family Medicine

## 2020-10-19 DIAGNOSIS — J449 Chronic obstructive pulmonary disease, unspecified: Secondary | ICD-10-CM

## 2020-10-30 ENCOUNTER — Telehealth: Payer: Self-pay | Admitting: Internal Medicine

## 2020-10-30 DIAGNOSIS — J441 Chronic obstructive pulmonary disease with (acute) exacerbation: Secondary | ICD-10-CM

## 2020-10-30 MED ORDER — DOXYCYCLINE HYCLATE 100 MG PO TABS: 100.0000 mg | ORAL_TABLET | Freq: Two times a day (BID) | ORAL | 0 refills | Status: DC

## 2020-10-30 MED ORDER — PREDNISONE 10 MG PO TABS: ORAL_TABLET | ORAL | 3 refills | Status: DC

## 2020-10-30 NOTE — Telephone Encounter (Signed)
Sorry to hear that he has not feeling well.  Can begin doxycycline 100 mg twice daily for 7 days please use sunscreen if outside.  Please hold azithromycin while taking.  Mucinex DM twice daily as needed for cough and congestion  Increase prednisone to 20 mg daily for 1 week.  Then resume 10 mg daily dosing  If not improving will need sooner office visit.  Please contact office for sooner follow up if symptoms do not improve or worsen or seek emergency care

## 2020-10-30 NOTE — Telephone Encounter (Signed)
Called and spoke with pt letting him know the recs stated by TP and he verbalized understanding. Verified preferred pharmacy and sent meds to pharmacy for pt. Nothing further needed.

## 2020-10-30 NOTE — Telephone Encounter (Signed)
Primary Pulmonologist: Ramaswamy Last office visit and with whom: 08/31/20 with Ramaswamy What do we see them for (pulmonary problems): COPD Last OV assessment/plan:  Plan:     Patient Instructions     ICD-10-CM   1. Chronic respiratory failure with hypoxia (HCC)  J96.11   2. COPD exacerbation ([place])  J44.1   3. COPD, frequent exacerbations ([place])  J44.1   4. Stage 4 very severe COPD by GOLD classification ([place])  J44.9   5. History of pulmonary embolus (PE)  Z86.711     Currently having another exacerbation despite optimal COPD care/treatment  Plan - Please take Take prednisone 40mg  once daily x 3 days, then 30mg  once daily x 3 days, then 20mg  once daily x 3 days, then prednisone 10mg  once daily basekube to continue   -Start omnicef 300mg  bid x 14 days per your request (hold azithro during this time)  -Continue oxygen, Spiriva, Symbicort, Daliresp and 3 times weekly azithromycin and daily prednisone otherwise [during flareups hold azithromycin and during flareup to a different schedule of prednisone as above]  -Continue anticoagulation  - strt mucinex  -   Sleep with head end of the bed elevated and feet  Follow-up -3 months or sooner with CAT score at follow-up print   Discussed goals of care and CODE STATUS and concurrent palliative care follow-up  Was appointment offered to patient (explain)?  Pt wants recommendations   Reason for call: called and spoke with pt who states he began to have complaints of cough with green phlegm, congestion, and wheezing which began 4 days ago.  Pt is using his spiriva and symbicort inhalers as prescribed and also is using the flutter valve. Pt said that he only had to use the rescue inhaler twice last week.  Pt stated that he was told by MR told him if he had another flare up to call the office.  Pt denies any complaints of any fever, has not checked temp but states that he does not feel feverish.  Pt is also taking  daliresp as prescribed. Pt has been using 3L O2 but states due to the flare that he has bumped himself up to 3.5L.  Pt is on chronic prednisone and he is also doing azithromycin mon, wed, Friday.  Due to the flare up that he has had, pt wants to know if there is anything that can be recommended to help with his symptoms. Tammy, please advise.    Allergies  Allergen Reactions  . Tape Other (See Comments)    SKIN IS VERY THIN, TEARS SKIN; CAN ONLY USE COBAN WRAPS DUE TO CONDITION OF SKIN!!  . Ciprofloxacin Nausea Only    Sick on stomach, weak/tired  . Levaquin [Levofloxacin] Other (See Comments)    hallucinations  . Lipitor [Atorvastatin] Rash    Immunization History  Administered Date(s) Administered  . Fluad Quad(high Dose 65+) 03/16/2019, 03/10/2020  . Influenza Split 04/24/2012  . Influenza Whole 05/08/2009, 04/08/2011  . Influenza, High Dose Seasonal PF 05/01/2017, 03/31/2018  . Influenza,inj,Quad PF,6+ Mos 04/27/2013, 04/15/2014, 04/24/2015  . Influenza,inj,quad, With Preservative 05/01/2018  . Influenza-Unspecified 05/13/2016  . PFIZER(Purple Top)SARS-COV-2 Vaccination 07/30/2019, 08/20/2019, 04/07/2020  . Pneumococcal Conjugate-13 02/25/2014  . Pneumococcal Polysaccharide-23 08/15/2011, 12/19/2016  . Pneumococcal-Unspecified 03/08/2017  . Td 02/21/2010  . Tdap 11/05/2017  . Zoster 01/29/2012

## 2020-11-06 ENCOUNTER — Telehealth: Payer: Self-pay | Admitting: Internal Medicine

## 2020-11-06 MED ORDER — CEFDINIR 300 MG PO CAPS: 300.0000 mg | ORAL_CAPSULE | Freq: Two times a day (BID) | ORAL | 0 refills | Status: DC

## 2020-11-06 NOTE — Telephone Encounter (Signed)
Yes, I see he last got omnicef in February and it appears to work well for him. I will send in RX, hold azithromycin. If not better needs office visit with CXR.

## 2020-11-06 NOTE — Telephone Encounter (Signed)
Called and spoke with patient in regard to the rx for his antibiotics; doxycycline (VIBRA-TABS) 100 MG tablet and stated that he was informed to let the nurse know if his symptoms have not improved within a week and he stated that he is not feeling better. Patient states he is still having productive cough with yellowish-green mucus and congestion. Patient states that he is normally prescribed  Cefdinir/ Omnicef   Please advise [phone].  Pharmacy; [id number] - [place], [place]

## 2020-11-06 NOTE — Telephone Encounter (Signed)
He can resume 10mg 

## 2020-11-06 NOTE — Telephone Encounter (Signed)
Pt is calling in regards to the rx for his antibiotics; doxycycline (VIBRA-TABS) 100 MG tablet and stated that he was informed to let the nurse know if his symptoms have not improved within a week and he stated that he is not feeling better. Symptoms; coughing, congestion, pt is coughing up a yellowish-green mucus. Pls regard; [phone]. Pharmacy; [id number] - [place], [place]

## 2020-11-06 NOTE — Telephone Encounter (Signed)
Patient is aware and voiced his understanding.  Nothing further needed at this time.

## 2020-11-06 NOTE — Telephone Encounter (Signed)
Called and spoke with patient to let him know that RX for Carole Civil has been sent to pharmacy for him. Patient is asking if he needs to continue to double the prednisone as directed by Tammy. He states that come tomorrow it will be a week that he has been doing 20 mg Prednisone. He was advised: Take 20mg  for 1 week then resume 10mg  daily.   Beth please advise on Prednisone

## 2020-11-15 ENCOUNTER — Encounter: Payer: Self-pay | Admitting: Gastroenterology

## 2020-11-15 ENCOUNTER — Ambulatory Visit (INDEPENDENT_AMBULATORY_CARE_PROVIDER_SITE_OTHER): Payer: Medicare Other | Admitting: Gastroenterology

## 2020-11-15 ENCOUNTER — Other Ambulatory Visit: Payer: Self-pay

## 2020-11-15 VITALS — BP 100/62 | HR 116

## 2020-11-15 DIAGNOSIS — K5520 Angiodysplasia of colon without hemorrhage: Secondary | ICD-10-CM

## 2020-11-15 DIAGNOSIS — Z8774 Personal history of (corrected) congenital malformations of heart and circulatory system: Secondary | ICD-10-CM

## 2020-11-15 DIAGNOSIS — Z862 Personal history of diseases of the blood and blood-forming organs and certain disorders involving the immune mechanism: Secondary | ICD-10-CM | POA: Diagnosis not present

## 2020-11-15 NOTE — Patient Instructions (Signed)
Labs to be drawn at PCP office at your request next week. We have placed orders in epic for Dr. Ronney Lion office.   If you are age 73 or older, your body mass index should be between 23-30. Your There is no height or weight on file to calculate BMI. If this is out of the aforementioned range listed, please consider follow up with your Primary Care Provider.  If you are age 20 or younger, your body mass index should be between 19-25. Your There is no height or weight on file to calculate BMI. If this is out of the aformentioned range listed, please consider follow up with your Primary Care Provider.   Follow up in 6 months.   Thank you for choosing me and [facility].  Dr. Rush Landmark

## 2020-11-21 ENCOUNTER — Encounter: Payer: Self-pay | Admitting: Gastroenterology

## 2020-11-21 DIAGNOSIS — Z862 Personal history of diseases of the blood and blood-forming organs and certain disorders involving the immune mechanism: Secondary | ICD-10-CM | POA: Insufficient documentation

## 2020-11-21 DIAGNOSIS — K5520 Angiodysplasia of colon without hemorrhage: Secondary | ICD-10-CM | POA: Insufficient documentation

## 2020-11-21 NOTE — Progress Notes (Signed)
[facility] VISIT   Primary Care Provider [name], [place] [address] [phone]  Patient Profile: Francisco [name] is a 75 y.o. male with a pmh significant for COPD (on home O2), PAD (on Eliquis), CHF, prior PE, hyperlipidemia, colon polyps, duodenal AVMs.  The patient presents to the [facility] for an evaluation and management of problem(s) noted below:  Problem List 1. History of iron deficiency anemia   2. AVM (arteriovenous malformation) of small bowel, acquired     History of Present Illness Please see initial consultation note and prior progress notes for full details of HPI.  Interval History Today, the patient returns for follow-up and he is accompanied by his wife.  He has done well since our last visit.  He feels his strength is increasing.  Lung status remains stable for him.  He and his wife still remain concerned about endoscopic reevaluation but agree that if it is absolutely necessary that they would move forward with repeat evaluation.  They recognize that he has not had a colonoscopy in many years and iron deficiency with anemia can be a result of that as well and we could be missing a colon mass/lesion though imaging has not suggested that previously.  He remains on PPI therapy.  He does not have any significant pyrosis symptoms.  Stool remains slightly dark due to iron that he is taking but no tarriness.  He denies hematochezia.  GI Review of Systems Positive as above Negative for nausea, vomiting, dysphagia, odynophagia, change in bowel habits, pain  Review of Systems General: Denies fevers/chills/unintentional weight loss Cardiovascular: Denies chest pain Pulmonary: Shortness of breath at his baseline Gastroenterological: See HPI Genitourinary: Denies darkened urine or hematuria Hematological: Positive for easy bruising/bleeding due to anticoagulation Dermatological: Denies  jaundice Psychological: Mood is stable   Medications Current Outpatient Medications  Medication Sig Dispense Refill  . acetaminophen (TYLENOL) 500 MG tablet Take 1,000 mg by mouth every 6 (six) hours as needed for moderate pain.     [name] Kitchen albuterol (VENTOLIN HFA) 108 (90 Base) MCG/ACT inhaler USE 2 PUFFS EVERY 6 HOURS  AS NEEDED FOR WHEEZING 18 g 3  . apixaban (ELIQUIS) 2.5 MG TABS tablet Take 1 tablet (2.5 mg total) by mouth 2 (two) times daily. 180 tablet 3  . azithromycin (ZITHROMAX) 250 MG tablet TAKE 1 TABLET BY MOUTH  EVERY MONDAY, WEDNESDAY,  AND FRIDAY 12 tablet 1  . Calcium Carbonate-Vitamin D (CALCIUM 600+D PO) Take 2 tablets by mouth daily.    . cefdinir (OMNICEF) 300 MG capsule Take 1 capsule (300 mg total) by mouth 2 (two) times daily. 20 capsule 0  . cycloSPORINE (RESTASIS) 0.05 % ophthalmic emulsion Place 1 drop into both eyes 2 (two) times daily as needed (dry eyes).     . DALIRESP 500 MCG TABS tablet TAKE 1 TABLET BY MOUTH  DAILY 90 tablet 3  . Ferrous Sulfate (IRON) 325 (65 Fe) MG TABS Take 1 tablet by mouth daily.    . fluticasone (FLONASE) 50 MCG/ACT nasal spray Place 2 sprays into both nostrils daily. 48 g 3  . folic acid (FOLVITE) 1 MG tablet Take 1 tablet (1 mg total) by mouth daily.    . furosemide (LASIX) 20 MG tablet TAKE 1 TABLET (20 MG TOTAL) BY MOUTH DAILY AS NEEDED FOR FLUID OR EDEMA. 90 tablet 3  . HYDROcodone-acetaminophen (NORCO/VICODIN) 5-325 MG tablet Take 2 tablets by mouth as needed.    [name] Kitchen  Multiple Vitamin (MULTIVITAMIN WITH MINERALS) TABS tablet Take 1 tablet by mouth daily.    . mupirocin cream (BACTROBAN) 2 % Apply 1 application topically 2 (two) times daily. 22 g 0  . OXYGEN Inhale 4-5 L into the lungs continuous.     . pantoprazole (PROTONIX) 20 MG tablet Take 1 tablet (20 mg total) by mouth daily. 90 tablet 1  . predniSONE (DELTASONE) 10 MG tablet Take 20mg  for 1 week then resume 10mg  daily. 90 tablet 3  . Respiratory Therapy Supplies (FLUTTER) DEVI 10  times Twice a day and prn as needed, may increase if feeling worse 1 each 0  . rosuvastatin (CRESTOR) 10 MG tablet Take 1 tablet (10 mg total) by mouth daily. 90 tablet 3  . sodium chloride HYPERTONIC 3 % nebulizer solution Take by nebulization daily. (Patient taking differently: Take 4 mLs by nebulization daily as needed (to loosen phlegm).) 90 mL 12  . SYMBICORT 160-4.5 MCG/ACT inhaler USE 2 INHALATIONS BY MOUTH  TWICE DAILY 30.6 g 3  . Tiotropium Bromide Monohydrate (SPIRIVA RESPIMAT) 2.5 MCG/ACT AERS USE 2 INHALATIONS BY MOUTH  DAILY 12 g 3  . traMADol (ULTRAM) 50 MG tablet Take 1 tablet (50 mg total) by mouth every 12 (twelve) hours as needed for moderate pain or severe pain. 90 tablet 1   No current facility-administered medications for this visit.    Allergies Allergies  Allergen Reactions  . Tape Other (See Comments)    SKIN IS VERY THIN, TEARS SKIN; CAN ONLY USE COBAN WRAPS DUE TO CONDITION OF SKIN!!  . Ciprofloxacin Nausea Only    Sick on stomach, weak/tired  . Levaquin [Levofloxacin] Other (See Comments)    hallucinations  . Lipitor [Atorvastatin] Rash    Histories Past Medical History:  Diagnosis Date  . Chronic back pain    "mid and lower" (04/07/2018)  . Chronic rhinitis    -Sinus Ct 08/01/2009 >> Bilateral maxillary sinusitis with some mucosal thickeningin the sphenoid and frontal sinuses as well with air fluid levels present -chronic rhinitis flyer Aug 04, 2009  . Compressed spine fracture ([place])   . COPD (chronic obstructive pulmonary disease) [facility])    PFT's rec Jul 17, 2009  . Emphysema lung ([place])   . On home oxygen therapy    "3L; 24/7" (04/07/2018)  . Onychomycosis    Dr. Judi Cong  . Orthostatic hypotension    "since 10/2017" (04/07/2018)  . PAD (peripheral artery disease) ([place])   . Pneumonia    "twice in 1 year" (04/07/2018)  . Pulmonary embolism ([place]) 04/07/2018  . Skin cancer    "lips, face, ears, arms" (04/07/2018)  . Vertigo    "since ~ 02/2018" (04/07/2018)    Past Surgical History:  Procedure Laterality Date  . ABDOMINAL AORTOGRAM W/LOWER EXTREMITY N/A 01/21/2020   Procedure: ABDOMINAL AORTOGRAM W/LOWER EXTREMITY;  Surgeon: Elam Dutch, MD;  Location: [facility];  Service: Cardiovascular;  Laterality: N/A;  . APPENDECTOMY    . BIOPSY  02/20/2020   Procedure: BIOPSY;  Surgeon: Rush Landmark Telford Nab., MD;  Location: [facility];  Service: Gastroenterology;;  . CATARACT EXTRACTION, BILATERAL    . ENDARTERECTOMY FEMORAL Right 04/19/2019   Procedure: ENDARTERECTOMY RIGHT COMMON FEMORAL;  Surgeon: Elam Dutch, MD;  Location: [facility];  Service: Vascular;  Laterality: Right;  . ENDARTERECTOMY FEMORAL Left 01/24/2020   Procedure: LEFT FEMORAL ENDARTERECTOMY WITH DACRON PATCH ANGIOPLASTY;  Surgeon: Elam Dutch, MD;  Location: [facility] OR;  Service: Vascular;  Laterality: Left;  . ESOPHAGOGASTRODUODENOSCOPY (  EGD) WITH PROPOFOL N/A 02/20/2020   Procedure: ESOPHAGOGASTRODUODENOSCOPY (EGD) WITH PROPOFOL;  Surgeon: Rush Landmark Telford Nab., MD;  Location: [facility];  Service: Gastroenterology;  Laterality: N/A;  . FEMORAL ENDARTERECTOMY Left 01/24/2020  . HEMOSTASIS CLIP PLACEMENT  02/20/2020   Procedure: HEMOSTASIS CLIP PLACEMENT;  Surgeon: Irving Copas., MD;  Location: [facility];  Service: Gastroenterology;;  . HOT HEMOSTASIS N/A 02/20/2020   Procedure: HOT HEMOSTASIS (ARGON PLASMA COAGULATION/BICAP);  Surgeon: Irving Copas., MD;  Location: [facility];  Service: Gastroenterology;  Laterality: N/A;  . INSERTION OF ILIAC STENT Left 01/24/2020   Procedure: INSERTION OF VBX STENT 8X59 AND 8X39 LEFT COMMON ILIAC ARTERY. INSERTION OF INNOVA 7 X 60 INNOVA STENT LEFT EXTERNAL ILIAC ARTERY. ;  Surgeon: Elam Dutch, MD;  Location: [facility];  Service: Vascular;  Laterality: Left;  . LOWER EXTREMITY ANGIOGRAPHY  12/11/2018  . LOWER EXTREMITY ANGIOGRAPHY N/A 12/11/2018   Procedure: LOWER EXTREMITY ANGIOGRAPHY;  Surgeon: Elam Dutch, MD;  Location: [facility];  Service: Cardiovascular;  Laterality: N/A;  . PATCH ANGIOPLASTY Right 04/19/2019   Procedure: Patch Angioplasty;  Surgeon: Elam Dutch, MD;  Location: [facility];  Service: Vascular;  Laterality: Right;  . PERIPHERAL VASCULAR INTERVENTION Right 12/11/2018   Procedure: PERIPHERAL VASCULAR INTERVENTION;  Surgeon: Elam Dutch, MD;  Location: [facility];  Service: Cardiovascular;  Laterality: Right;  Common Iliac   . SKIN CANCER EXCISION     "lips, face, ears, arms" (04/07/2018)  . TONSILLECTOMY    . ULTRASOUND GUIDANCE FOR VASCULAR ACCESS Right 01/24/2020   Procedure: ULTRASOUND GUIDANCE FOR VASCULAR ACCESS;  Surgeon: Elam Dutch, MD;  Location: [facility] OR;  Service: Vascular;  Laterality: Right;   Social History   Socioeconomic History  . Marital status: Married    Spouse name: Not on file  . Number of children: 2  . Years of education: 3 years of college  . Highest education level: Not on file  Occupational History    Employer: [employer]  Tobacco Use  . Smoking status: Former Smoker    Packs/day: 2.00    Years: 52.00    Pack years: 104.00    Types: Pipe, Cigarettes    Start date: 10/06/1965    Quit date: 10/06/2017    Years since quitting: 3.1  . Smokeless tobacco: Never Used  . Tobacco comment: 04/07/2018 "smoked cigarettes years ago; stopped ~ 20 yr ago; stopped smoking pipe in 09/2017"  Vaping Use  . Vaping Use: Never used  Substance and Sexual Activity  . Alcohol use: Yes    Alcohol/week: 42.0 standard drinks    Types: 42 Cans of beer per week  . Drug use: No  . Sexual activity: Not Currently  Other Topics Concern  . Not on file  Social History Narrative   Married (wife Katy Apo patient of Dr. Yong Channel) with children (2-son and daughter). 1 grandson.       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       Hobbies: fishing, projects at home   Social Determinants of Systems developer Strain: Not on file  Food Insecurity: Not on file  Transportation Needs: Not on file  Physical Activity: Not on file  Stress: Not on file  Social Connections: Not on file  Intimate Partner Violence: Not on file   Family History  Problem Relation Age of Onset  . Heart disease Mother  CABG in her 94s, nonsmoker  . Cancer Mother   . Stroke Father   . Heart disease Father        Died of MI at age 82, smoker  . Hepatitis Sister   . Coronary artery disease Other        male 1st degree relative <60  . Colon cancer Neg Hx   . Pancreatic cancer Neg Hx   . Esophageal cancer Neg Hx   . Inflammatory bowel disease Neg Hx   . Liver disease Neg Hx   . Rectal cancer Neg Hx   . Stomach cancer Neg Hx    I have reviewed his medical, social, and family history in detail and updated the electronic medical record as necessary.    PHYSICAL EXAMINATION  BP 100/62   Pulse (!) 116  Wt Readings from Last 3 Encounters:  10/04/20 176 lb 3.2 oz (79.9 kg)  09/21/20 175 lb (79.4 kg)  08/31/20 175 lb (79.4 kg)  GEN: NAD, appears older than stated age, appears chronically ill but nontoxic, accompanied by wife PSYCH: Cooperative, without pressured speech EYE: Conjunctivae pink, sclerae anicteric ENT: Nasal cannula in place CV: Nontachycardic RESP: Wheezing appreciated GI: NABS, soft, NT/ND, without rebound or guarding MSK/EXT: Trace bilateral lower extremity edema present SKIN: No jaundice NEURO:  Alert & Oriented x 3, no focal deficits   REVIEW OF DATA  I reviewed the following data at the time of this encounter:  GI Procedures and Studies  Previously reviewed  Laboratory Studies  Reviewed those in epic  Imaging Studies  No relevant studies to review   ASSESSMENT  Mr. Mayabb is a 75 y.o. male with a pmh significant for COPD (on home O2), PAD (on Eliquis), CHF, prior PE, hyperlipidemia, colon polyps, duodenal AVMs.  The patient is seen today for evaluation and management  of:  1. History of iron deficiency anemia   2. AVM (arteriovenous malformation) of small bowel, acquired    The patient is hemodynamically and clinically stable.  He continues to do well since our last evaluation.  Hopefully his labs will show continued stability.  We rediscussed that he has not had a colonoscopy in years and that lesions in the colon can lead to iron deficiency but at this point we are holding on that procedure being performed due to his overall medical issues that are at play currently.  The patient will be seeing his PCP in the coming weeks.  Since he is doing well, we will let our laboratories be performed at that time.  If the patient has recurrent evidence of iron deficiency I will recommend iron infusions before an endoscopic reevaluation but if he continues to have iron deficiency we certainly will need to consider endoscopic reevaluation.  My thoughts would be to consider doing his colonoscopy and a video capsule endoscopy versus just doing a video capsule endoscopy with the inherent knowledge that we would be not evaluating the colon but that may be a less risky procedure to consider.  We will see and hopefully not have to move forward with either of these.  All patient questions were answered to the best of my ability, and the patient agrees to the aforementioned plan of action with follow-up as indicated.   PLAN  Maintain PPI 20 mg daily indefinitely CBC and iron indices to be obtained at upcoming PCP visit If evidence of iron deficiency is present will consider role of repeat endoscopic evaluation from above and below but will need IV iron  infusions first Continue daily oral iron   Orders Placed This Encounter  Procedures  . CBC  . IBC + Ferritin    New Prescriptions   No medications on file   Modified Medications   No medications on file    Planned Follow Up Return in about 6 months (around 05/18/2021).   Total Time in Face-to-Face and in Coordination of  Care for patient including independent/personal interpretation/review of prior testing, medical history, examination, medication adjustment, communicating results with the patient directly, and documentation with the EHR is 25 minutes.    Justice Britain, MD [facility] Office # [phone]

## 2020-11-24 ENCOUNTER — Other Ambulatory Visit (HOSPITAL_BASED_OUTPATIENT_CLINIC_OR_DEPARTMENT_OTHER): Payer: Self-pay

## 2020-11-24 ENCOUNTER — Ambulatory Visit: Payer: Medicare Other | Attending: Internal Medicine

## 2020-11-24 ENCOUNTER — Other Ambulatory Visit: Payer: Self-pay

## 2020-11-24 DIAGNOSIS — Z23 Encounter for immunization: Secondary | ICD-10-CM

## 2020-11-24 MED ORDER — PFIZER-BIONT COVID-19 VAC-TRIS 30 MCG/0.3ML IM SUSP
INTRAMUSCULAR | 0 refills | Status: DC
  Filled 2020-11-24: qty 0.3, 1d supply, fill #0

## 2020-11-24 NOTE — Progress Notes (Signed)
   Covid-19 Vaccination Clinic  Name:  Francisco [name]    MRN: [id number] DOB: August 27, 1945  11/24/2020  [name] was observed post Covid-19 immunization for 15 minutes without incident. He was provided with Vaccine Information Sheet and instruction to access the V-Safe system.   [name] was instructed to call 911 with any severe reactions post vaccine: [name] Kitchen Difficulty breathing  . Swelling of face and throat  . A fast heartbeat  . A bad rash all over body  . Dizziness and weakness   Immunizations Administered    Name Date Dose VIS Date Route   PFIZER Comrnaty(Gray TOP) Covid-19 Vaccine 11/24/2020 10:34 AM 0.3 mL 06/15/2020 Intramuscular   Manufacturer: [employer]   Lot: [id number]   NDC: [phone]

## 2020-12-07 ENCOUNTER — Ambulatory Visit (INDEPENDENT_AMBULATORY_CARE_PROVIDER_SITE_OTHER): Payer: Medicare Other | Admitting: Family Medicine

## 2020-12-07 ENCOUNTER — Encounter: Payer: Self-pay | Admitting: Family Medicine

## 2020-12-07 VITALS — BP 134/78 | HR 103 | Temp 98.2°F | Ht 72.0 in | Wt 179.0 lb

## 2020-12-07 DIAGNOSIS — I2699 Other pulmonary embolism without acute cor pulmonale: Secondary | ICD-10-CM | POA: Diagnosis not present

## 2020-12-07 DIAGNOSIS — J449 Chronic obstructive pulmonary disease, unspecified: Secondary | ICD-10-CM | POA: Diagnosis not present

## 2020-12-07 DIAGNOSIS — E785 Hyperlipidemia, unspecified: Secondary | ICD-10-CM | POA: Diagnosis not present

## 2020-12-07 DIAGNOSIS — I739 Peripheral vascular disease, unspecified: Secondary | ICD-10-CM

## 2020-12-07 DIAGNOSIS — D509 Iron deficiency anemia, unspecified: Secondary | ICD-10-CM | POA: Diagnosis not present

## 2020-12-07 DIAGNOSIS — M818 Other osteoporosis without current pathological fracture: Secondary | ICD-10-CM

## 2020-12-07 DIAGNOSIS — I2782 Chronic pulmonary embolism: Secondary | ICD-10-CM

## 2020-12-07 LAB — CBC WITH DIFFERENTIAL/PLATELET
Basophils Absolute: 0.1 10*3/uL (ref 0.0–0.1)
Basophils Relative: 1.2 % (ref 0.0–3.0)
Eosinophils Absolute: 0.2 10*3/uL (ref 0.0–0.7)
Eosinophils Relative: 2.2 % (ref 0.0–5.0)
HCT: 39.3 % (ref 39.0–52.0)
Hemoglobin: 13.3 g/dL (ref 13.0–17.0)
Lymphocytes Relative: 13.3 % (ref 12.0–46.0)
Lymphs Abs: 1.5 10*3/uL (ref 0.7–4.0)
MCHC: 33.7 g/dL (ref 30.0–36.0)
MCV: 98.9 fl (ref 78.0–100.0)
Monocytes Absolute: 1.2 10*3/uL — ABNORMAL HIGH (ref 0.1–1.0)
Monocytes Relative: 11.4 % (ref 3.0–12.0)
Neutro Abs: 7.8 10*3/uL — ABNORMAL HIGH (ref 1.4–7.7)
Neutrophils Relative %: 71.9 % (ref 43.0–77.0)
Platelets: 265 10*3/uL (ref 150.0–400.0)
RBC: 3.98 Mil/uL — ABNORMAL LOW (ref 4.22–5.81)
RDW: 14.2 % (ref 11.5–15.5)
WBC: 10.9 10*3/uL — ABNORMAL HIGH (ref 4.0–10.5)

## 2020-12-07 NOTE — Patient Instructions (Addendum)
Health Maintenance Due  Topic Date Due  . Zoster Vaccines- Shingrix (1 of 2) will schedule this when the pharmacy gets this medicine in. Never done  . DEXA SCAN   Schedule your bone density test at check out desk. You may also call directly to X-ray at [phone] to schedule an appointment that is convenient for you.  - located [address] across the street from [place] - in the basement - you do need an appointment for the bone density tests.    11/13/2019  . COLONOSCOPY- holding off for now per Dr. Rush Landmark 04/02/2020   Team please set him up for reclast (I am ordering bone density today but we need to go ahead and get ball rolling on reclast as can take some time)  You need to take at least 800 units vitamin D per day and 1200 mg of calcium- team give calcium handout- better to get calcium through foods at least for a portion of this goal  Please stop by lab before you go If you have mychart- we will send your results within 3 business days of [place] receiving them.  If you do not have mychart- we will call you about results within 5 business days of [place] receiving them.  *please also note that you will see labs on mychart as soon as they post. I will later go in and write notes on them- will say "notes from Dr. Yong Channel"  Recommended follow up: Return in about 6 months (around 06/08/2021) for physical or sooner if needed.

## 2020-12-07 NOTE — Progress Notes (Signed)
Phone [phone] In person visit   Subjective:   Francisco [name] is a 75 y.o. year old very pleasant male patient who presents for/with See problem oriented charting Chief Complaint  Patient presents with  . Hypertension   This visit occurred during the SARS-CoV-2 public health emergency.  Safety protocols were in place, including screening questions prior to the visit, additional usage of staff PPE, and extensive cleaning of exam room while observing appropriate contact time as indicated for disinfecting solutions.   Past Medical History-  Patient Active Problem List   Diagnosis Date Noted  . Acute GI bleeding 02/18/2020    Priority: High  . PAD (peripheral artery disease) ([place]) 12/11/2018    Priority: High  . 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 DNR (do not resuscitate) 04/21/2018    Priority: High  . Chronic pulmonary embolism ([place]) 04/07/2018    Priority: High  . Osteoporosis 11/19/2017    Priority: High  . Thoracic compression fracture ([place]) 11/02/2017    Priority: High  . Former smoker 08/23/2008    Priority: High  . Essential tremor 03/31/2018    Priority: Medium  . BPPV (benign paroxysmal positional vertigo), right 02/10/2018    Priority: Medium  . Essential hypertension 11/02/2017    Priority: Medium  . BPH associated with nocturia 06/25/2017    Priority: Medium  . Hyperglycemia 06/12/2016    Priority: Medium  . Hyperlipidemia 07/22/2014    Priority: Medium  . Pre-operative respiratory examination 12/10/2018    Priority: Low  . Venous stasis dermatitis of both lower extremities 11/02/2017    Priority: Low  . Chronic respiratory failure with hypoxia ([place]) 09/23/2017    Priority: Low  . Leukocytosis 12/19/2016    Priority: Low  . Onychomycosis 07/22/2014    Priority: Low  . Left ankle swelling 07/22/2014    Priority: Low  . History of skin cancer 05/17/2014    Priority: Low  . Chronic rhinitis  10/28/2012    Priority: Low  . Pulmonary nodule 09/23/2011    Priority: Low  . History of colonic polyps 08/23/2008    Priority: Low  . AVM (arteriovenous malformation) of small bowel, acquired 11/21/2020  . History of iron deficiency anemia 11/21/2020  . Iron deficiency anemia due to chronic blood loss 08/23/2020  . Severe comorbid illness 08/18/2020  . History of anemia 08/18/2020  . 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  . History of upper GI bleeding 04/30/2020  . Hiatal hernia 04/30/2020  . Chronic anticoagulation 04/30/2020  . Anemia 04/30/2020  . Abnormal findings on diagnostic imaging of lung 03/14/2020  . Lung infiltrate on CT 02/18/2020  . Lactic acidosis 02/18/2020  . Diastolic dysfunction 36/14/4315    Medications- reviewed and updated Current Outpatient Medications  Medication Sig Dispense Refill  . acetaminophen (TYLENOL) 500 MG tablet Take 1,000 mg by mouth every 6 (six) hours as needed for moderate pain.     [name] Kitchen albuterol (VENTOLIN HFA) 108 (90 Base) MCG/ACT inhaler USE 2 PUFFS EVERY 6 HOURS  AS NEEDED FOR WHEEZING 18 g 3  . apixaban (ELIQUIS) 2.5 MG TABS tablet Take 1 tablet (2.5 mg total) by mouth 2 (two) times daily. 180 tablet 3  . azithromycin (ZITHROMAX) 250 MG tablet TAKE 1 TABLET BY MOUTH  EVERY MONDAY, WEDNESDAY,  AND FRIDAY 12 tablet 1  . Calcium Carbonate-Vitamin D (CALCIUM 600+D PO) Take 2 tablets by mouth daily.    . cefdinir (OMNICEF) 300  MG capsule Take 1 capsule (300 mg total) by mouth 2 (two) times daily. 20 capsule 0  . cycloSPORINE (RESTASIS) 0.05 % ophthalmic emulsion Place 1 drop into both eyes 2 (two) times daily as needed (dry eyes).     . DALIRESP 500 MCG TABS tablet TAKE 1 TABLET BY MOUTH  DAILY 90 tablet 3  . Ferrous Sulfate (IRON) 325 (65 Fe) MG TABS Take 1 tablet by mouth daily.    . fluticasone (FLONASE) 50 MCG/ACT nasal spray Place 2 sprays into both nostrils daily. 48 g 3  . folic acid (FOLVITE) 1 MG tablet Take 1 tablet (1 mg  total) by mouth daily.    . furosemide (LASIX) 20 MG tablet TAKE 1 TABLET (20 MG TOTAL) BY MOUTH DAILY AS NEEDED FOR FLUID OR EDEMA. 90 tablet 3  . HYDROcodone-acetaminophen (NORCO/VICODIN) 5-325 MG tablet Take 2 tablets by mouth as needed.    . Multiple Vitamin (MULTIVITAMIN WITH MINERALS) TABS tablet Take 1 tablet by mouth daily.    . mupirocin cream (BACTROBAN) 2 % Apply 1 application topically 2 (two) times daily. 22 g 0  . OXYGEN Inhale 4-5 L into the lungs continuous.     . pantoprazole (PROTONIX) 20 MG tablet Take 1 tablet (20 mg total) by mouth daily. 90 tablet 1  . predniSONE (DELTASONE) 10 MG tablet Take 20mg  for 1 week then resume 10mg  daily. 90 tablet 3  . Respiratory Therapy Supplies (FLUTTER) DEVI 10 times Twice a day and prn as needed, may increase if feeling worse 1 each 0  . rosuvastatin (CRESTOR) 10 MG tablet Take 1 tablet (10 mg total) by mouth daily. 90 tablet 3  . SYMBICORT 160-4.5 MCG/ACT inhaler USE 2 INHALATIONS BY MOUTH  TWICE DAILY 30.6 g 3  . Tiotropium Bromide Monohydrate (SPIRIVA RESPIMAT) 2.5 MCG/ACT AERS USE 2 INHALATIONS BY MOUTH  DAILY 12 g 3  . traMADol (ULTRAM) 50 MG tablet Take 1 tablet (50 mg total) by mouth every 12 (twelve) hours as needed for moderate pain or severe pain. 90 tablet 1  . sodium chloride HYPERTONIC 3 % nebulizer solution Take by nebulization daily. (Patient not taking: Reported on 12/07/2020) 90 mL 12   No current facility-administered medications for this visit.     Objective:  BP 134/78   Pulse (!) 103   Temp 98.2 F (36.8 C) (Temporal)   Ht 6' (1.829 m)   Wt 179 lb (81.2 kg)   SpO2 99%   BMI 24.28 kg/m  Gen: NAD, resting comfortably CV: RRR no murmurs rubs or gallops.  Not tachycardic on my exam but high normal in the 90s Lungs: CTAB no crackles, mild diffuse wheeze, no rhonchi Ext: 1+edema, chronic venous stasis changes Skin: warm, dry     Assessment and Plan  #Social Updates: He has been doing well ever since his last iron  transfusion other than some intermittent COPD exacerbations managed by pulmonology.  #Iron deficiency anemia #Chronic pulmonary embolism S: Patient with history of significant GI bleed/melena with hemoglobin down to as low as 5 requiring hospitalization last year.  Argon plasma coagulation as well as clips helped stabilize the bleeding.  He was placed on high-dose PPI twice daily.  Patient was changed from Xarelto and aspirin to Eliquis 2.5 twice daily-aspirin was stopped.  Patient was later reduced to once daily PPI.  Patient remains on chronic iron 325 mg daily.  Has required iron infusions in the past.  Iron deficiency has not been explored further with colonoscopy repeat due  to overall health- Dr. Rush Landmark discussed doing possible video capsule endoscopy instead of endoscopy due to overall risks.  Patient would also likely have iron infusions before this intervention per Dr. Rush Landmark  Patient with history of pulmonary embolism/chronic PE-she remains on Eliquis 2.5 mg twice daily-we believe the lower dose will hopefully be adequate given primary risk factor has been sedentary activity-patient knows if he has worsening symptoms of breathing (with COPD may be difficult to tell) or swelling in the [place] let us know immediately  A/P: For iron deficiency anemia-update labs as requested by GI and hematology  For chronic pulmonary embolism-continue Eliquis 2.5 mg twice daily and patient knows if he has worsening shortness of breath to let us know - no signs noted while on Eliquis -does has pains in legs, but no thigh or calf  #PAD- no claudication but the patient is rather sedentary. #hyperlipidemia S: Medication:Now off aspirin-on Eliquis 2.5 mg twice daily alone. -Patient with rash on atorvastatin/Lipitor-we opted to try rosuvastatin once a week with plans to increase to 3 times a week and then eventually daily-patient reports no issues on rosuvastatin - no signs noted while on Eliquis 2.5  mg  A/P: PAD appears stable.  Continue current medications.  For hyperlipidemia-we will update direct LDL today with labs-hoping for LDL under 70  #Severe COPD-requires 4 to 5 L of oxygen S: Patient reports stable shortness of breath and cough  Maintenance medications: Daliresp, azithromycin Monday Wednesday Friday, Symbicort, Spiriva  Patient has had to use albuterol 3x per week with flareups but typically less than twice a week. A/P: Patient is doing well overall recently-seemed to be having a downward trajectory last year but has responded very nicely after anemia treatment  #Back pain- notes that his back has not been doing great.  He follows with Dr. Maxie Better for this.  Dr. Maxie Better would like for him to be on osteoporosis medication  # Osteoporosis S: Last DEXA: Over 2 years ago-agrees to repeat  Medication (bisphosphonate or prolia): - Had to be taken off of Fosamax that he could start Daliresp  Calcium: 1200mg  (through diet ok) recommended  Vitamin D: 1000 units a day recommended Last vitamin D Lab Results  Component Value Date   VD25OH 36.11 06/02/2019   A/P: Osteoporosis status unknown-update bone density.  On the other hand he likely should be on treatment- patient cannot be on Fosamax due to reflux risk with Daliresp.  He would be eligible for Reclast trial-team is going to look into setting this up.He is taking calcium and vitamin D-take 800 mg of vitamin D per day and 1200 mg of Calcium.  Recommended follow KD:TOIZTI in about 6 months (around 06/08/2021) for physical or sooner if needed. Future Appointments  Date Time Provider [place]  12/11/2020 10:30 AM [facility] DEXA 1 [facility]  01/04/2021  9:00 AM [facility] None  01/04/2021  9:30 AM Curt Bears, MD [facility] None  12/10/2021  8:00 AM Yong Channel [name], MD [facility]    Lab/Order associations:   ICD-10-CM   1. Hyperlipidemia, unspecified hyperlipidemia type  E78.5 CBC with  Differential/Platelet    Comprehensive metabolic panel    LDL cholesterol, direct  2. Pulmonary embolism, unspecified chronicity, unspecified pulmonary embolism type, unspecified whether acute cor pulmonale present (HCC) Chronic I26.99   3. Iron deficiency anemia, unspecified iron deficiency anemia type  D50.9 IBC + Ferritin  4. Stage 4 very severe COPD by GOLD classification ([place])  J44.9   5. PAD (peripheral artery  disease) ([place])  I73.9   6. Other chronic pulmonary embolism, unspecified whether acute cor pulmonale present (HCC)  I27.82   7. Other osteoporosis without current pathological fracture  M81.8 DG Bone Density    I,Harris Phan,acting as a scribe for Garret Reddish, MD.,have documented all relevant documentation on the behalf of Garret Reddish, MD,as directed by  Garret Reddish, MD while in the presence of Garret Reddish, MD.   I, Garret Reddish, MD, have reviewed all documentation for this visit. The documentation on 12/07/20 for the exam, diagnosis, procedures, and orders are all accurate and complete.   Return precautions advised.  Garret Reddish, MD

## 2020-12-08 LAB — COMPREHENSIVE METABOLIC PANEL
ALT: 20 U/L (ref 0–53)
AST: 27 U/L (ref 0–37)
Albumin: 4.2 g/dL (ref 3.5–5.2)
Alkaline Phosphatase: 55 U/L (ref 39–117)
BUN: 18 mg/dL (ref 6–23)
CO2: 27 mEq/L (ref 19–32)
Calcium: 9.3 mg/dL (ref 8.4–10.5)
Chloride: 98 mEq/L (ref 96–112)
Creatinine, Ser: 1.05 mg/dL (ref 0.40–1.50)
GFR: 70.56 mL/min (ref >60.00)
Glucose, Bld: 77 mg/dL (ref 70–99)
Potassium: 4.8 mEq/L (ref 3.5–5.1)
Sodium: 140 mEq/L (ref 135–145)
Total Bilirubin: 0.6 mg/dL (ref 0.2–1.2)
Total Protein: 7.1 g/dL (ref 6.0–8.3)

## 2020-12-08 LAB — LDL CHOLESTEROL, DIRECT: Direct LDL: 72 mg/dL

## 2020-12-08 LAB — IBC + FERRITIN
Ferritin: 192.4 ng/mL (ref 22.0–322.0)
Iron: 70 ug/dL (ref 42–165)
Saturation Ratios: 18.7 % — ABNORMAL LOW (ref 20.0–50.0)
Transferrin: 267 mg/dL (ref 212.0–360.0)

## 2020-12-11 ENCOUNTER — Other Ambulatory Visit: Payer: Self-pay

## 2020-12-11 ENCOUNTER — Ambulatory Visit (INDEPENDENT_AMBULATORY_CARE_PROVIDER_SITE_OTHER)
Admission: RE | Admit: 2020-12-11 | Discharge: 2020-12-11 | Disposition: A | Discharge status: Home / Self Care | Payer: Medicare Other | Source: Ambulatory Visit | Attending: Family Medicine | Admitting: Family Medicine

## 2020-12-11 ENCOUNTER — Encounter: Payer: Self-pay | Admitting: Family Medicine

## 2020-12-11 DIAGNOSIS — M818 Other osteoporosis without current pathological fracture: Secondary | ICD-10-CM | POA: Diagnosis not present

## 2020-12-18 ENCOUNTER — Encounter: Payer: Self-pay | Admitting: Internal Medicine

## 2020-12-18 ENCOUNTER — Encounter: Payer: Self-pay | Admitting: Family Medicine

## 2020-12-20 ENCOUNTER — Other Ambulatory Visit: Payer: Self-pay | Admitting: *Deleted

## 2020-12-20 MED ORDER — ROSUVASTATIN CALCIUM 10 MG PO TABS: 10.0000 mg | ORAL_TABLET | Freq: Every day | ORAL | 1 refills | Status: DC

## 2020-12-20 MED ORDER — APIXABAN 2.5 MG PO TABS: 2.5000 mg | ORAL_TABLET | Freq: Two times a day (BID) | ORAL | 1 refills | Status: DC

## 2020-12-25 ENCOUNTER — Other Ambulatory Visit (HOSPITAL_COMMUNITY): Payer: Self-pay | Admitting: *Deleted

## 2020-12-26 ENCOUNTER — Ambulatory Visit (HOSPITAL_COMMUNITY)
Admission: RE | Admit: 2020-12-26 | Discharge: 2020-12-26 | Disposition: A | Discharge status: Home / Self Care | Payer: Medicare Other | Source: Ambulatory Visit | Attending: Family Medicine | Admitting: Family Medicine

## 2020-12-26 ENCOUNTER — Other Ambulatory Visit: Payer: Self-pay

## 2020-12-26 DIAGNOSIS — M810 Age-related osteoporosis without current pathological fracture: Secondary | ICD-10-CM | POA: Insufficient documentation

## 2020-12-26 MED ORDER — ZOLEDRONIC ACID 5 MG/100ML IV SOLN: 5.0000 mg | Freq: Once | INTRAVENOUS | Status: AC

## 2020-12-26 MED ORDER — ZOLEDRONIC ACID 5 MG/100ML IV SOLN
INTRAVENOUS | Status: AC
  Administered 2020-12-26: 5 mg via INTRAVENOUS
  Filled 2020-12-26: qty 100

## 2021-01-02 ENCOUNTER — Telehealth: Payer: Self-pay | Admitting: Internal Medicine

## 2021-01-02 NOTE — Telephone Encounter (Signed)
Cancelled appt per 6/28 sch msg. Pt declined to r/s at this time.

## 2021-01-04 ENCOUNTER — Inpatient Hospital Stay: Payer: Medicare Other | Admitting: Internal Medicine

## 2021-01-04 ENCOUNTER — Other Ambulatory Visit: Payer: Medicare Other

## 2021-01-15 ENCOUNTER — Other Ambulatory Visit: Payer: Self-pay

## 2021-01-15 MED ORDER — TRAMADOL HCL 50 MG PO TABS: 50.0000 mg | ORAL_TABLET | Freq: Two times a day (BID) | ORAL | 1 refills | Status: DC | PRN

## 2021-01-15 MED ORDER — FUROSEMIDE 20 MG PO TABS: ORAL_TABLET | ORAL | 3 refills | Status: DC

## 2021-03-03 ENCOUNTER — Other Ambulatory Visit: Payer: Self-pay | Admitting: Internal Medicine

## 2021-03-03 DIAGNOSIS — J441 Chronic obstructive pulmonary disease with (acute) exacerbation: Secondary | ICD-10-CM

## 2021-03-07 ENCOUNTER — Telehealth: Payer: Self-pay

## 2021-03-07 ENCOUNTER — Telehealth: Payer: Self-pay | Admitting: Internal Medicine

## 2021-03-07 ENCOUNTER — Other Ambulatory Visit: Payer: Self-pay | Admitting: Internal Medicine

## 2021-03-07 ENCOUNTER — Other Ambulatory Visit: Payer: Self-pay

## 2021-03-07 DIAGNOSIS — J449 Chronic obstructive pulmonary disease, unspecified: Secondary | ICD-10-CM

## 2021-03-07 DIAGNOSIS — J441 Chronic obstructive pulmonary disease with (acute) exacerbation: Secondary | ICD-10-CM

## 2021-03-07 MED ORDER — PREDNISONE 10 MG PO TABS: 10.0000 mg | ORAL_TABLET | Freq: Every day | ORAL | 2 refills | Status: DC

## 2021-03-07 MED ORDER — PANTOPRAZOLE SODIUM 20 MG PO TBEC: 20.0000 mg | DELAYED_RELEASE_TABLET | Freq: Every day | ORAL | 1 refills | Status: DC

## 2021-03-07 MED ORDER — TRAMADOL HCL 50 MG PO TABS: 50.0000 mg | ORAL_TABLET | Freq: Two times a day (BID) | ORAL | 1 refills | Status: DC | PRN

## 2021-03-07 NOTE — Telephone Encounter (Signed)
Call returned to patient wife (dpr), confirmed patient DOB. Confirmed medication and pharmacy.   Per MR (note) continue prednisone at 10mg  daily.   Medication sent.   Nothing further needed at this time.

## 2021-03-07 NOTE — Telephone Encounter (Signed)
MEDICATION: pantoprazole (PROTONIX) 20 MG tablet traMADol (ULTRAM) 50 MG tablet   PHARMACY:  [facility]  ([place], [place] Phone:  [phone]  Fax:  [phone]      Comments:   **Let patient know to contact pharmacy at the end of the day to make sure medication is ready. **  ** Please notify patient to allow 48-72 hours to process**  **Encourage patient to contact the pharmacy for refills or they can request refills through [facility]**

## 2021-03-07 NOTE — Telephone Encounter (Signed)
Error

## 2021-03-07 NOTE — Telephone Encounter (Signed)
Pantoprazole sent to pharmacy and tramadol sent to Dr. Yong Channel for approval.

## 2021-03-15 ENCOUNTER — Telehealth: Payer: Self-pay | Admitting: Internal Medicine

## 2021-03-15 DIAGNOSIS — J441 Chronic obstructive pulmonary disease with (acute) exacerbation: Secondary | ICD-10-CM

## 2021-03-15 MED ORDER — PREDNISONE 10 MG PO TABS: ORAL_TABLET | ORAL | 2 refills | Status: DC

## 2021-03-15 NOTE — Telephone Encounter (Signed)
I have resent the rx in for the prednisone.  There were 2 sigs on the rx that was sent in.  [facility] would not accept the first one. Nothing further is needed.

## 2021-03-18 ENCOUNTER — Other Ambulatory Visit: Payer: Self-pay | Admitting: Internal Medicine

## 2021-04-26 ENCOUNTER — Ambulatory Visit (INDEPENDENT_AMBULATORY_CARE_PROVIDER_SITE_OTHER): Payer: Medicare Other | Admitting: Internal Medicine

## 2021-04-26 ENCOUNTER — Encounter: Payer: Self-pay | Admitting: Internal Medicine

## 2021-04-26 ENCOUNTER — Other Ambulatory Visit: Payer: Self-pay

## 2021-04-26 VITALS — BP 112/78 | HR 104 | Temp 98.6°F | Ht 72.0 in | Wt 175.0 lb

## 2021-04-26 DIAGNOSIS — Z86711 Personal history of pulmonary embolism: Secondary | ICD-10-CM

## 2021-04-26 DIAGNOSIS — J449 Chronic obstructive pulmonary disease, unspecified: Secondary | ICD-10-CM

## 2021-04-26 DIAGNOSIS — J9611 Chronic respiratory failure with hypoxia: Secondary | ICD-10-CM | POA: Diagnosis not present

## 2021-04-26 DIAGNOSIS — J441 Chronic obstructive pulmonary disease with (acute) exacerbation: Secondary | ICD-10-CM | POA: Diagnosis not present

## 2021-04-26 LAB — D-DIMER, QUANTITATIVE: D-Dimer, Quant: 0.46 mcg/mL FEU (ref <0.50)

## 2021-04-26 NOTE — Patient Instructions (Addendum)
ICD-10-CM   1. Chronic respiratory failure with hypoxia (HCC)  J96.11     2. COPD, frequent exacerbations ([place])  J44.1     3. Stage 4 very severe COPD by GOLD classification ([place])  J44.9     4. History of pulmonary embolus (PE)  Z86.711       Stable - no flare up past few months  Currently having another exacerbation despite optimal COPD care/treatment  Plan - continue prednisone 10mg  once daily baseline   --Continue oxygen, Spiriva, Symbicort, Daliresp and 3 times weekly azithromycin and daily prednisone otherwise [during flareups hold azithromycin and during flareup to a different schedule of prednisone as above]  -Continue anticoagulation but check d-dimer 04/26/2021 to decide on length of anticoagulation  -continue flutter valve  - do CT chest without contrast in 3 months   Follow-up -3 months or sooner but after CT chest

## 2021-04-26 NOTE — Progress Notes (Signed)
ROV 12/30/16 -- patient has a history of tobacco use, COPD currently maintained on chronic prednisone, Symbicort, Spiriva. Most recent blurry function testing was March 2011 with an FEV1 of 1.2 L (34% Pred), severe obstruction. He up titrates his prednisone on his own based on symptoms. No abx since last time. He has increased pred temporrily about 4 -5 x since last time. No increases for over a month. He is smoking a pipe, not cigarettes. He is using HCTZ more frequently these days. He is on flonase and singulair. He does note that he has some slow progression of his DOE. He coughs a few times a day, clears clear sputum.   rov 07/17/17 --this is a follow-up visit for patient with active tobacco use, severe COPD and chronic prednisone use.  He often titrates his prednisone on his own depending on how he is feeling. He complains of a new pain in his back below his left shoulder blade, has been present for the last 10 days. sometimes pleuritic, can be stabbing, worse w cough. His pred use: on 10mg  for the last 19 days. No hx VTE. He has LE edema, venous stasis changes, increased since his diuretics decreased.   ROV 09/23/17 --75 year old man with a history of tobacco use (still smokes pipe), severe COPD and severe obstruction on spirometry.  At his last visit he had some pleuritic left back pain and I performed a CT PA that I have reviewed from 07/17/17.  There was no pulmonary embolism but extensive emphysema present, some bilateral apical scar more so on the right, stable.  He uses prednisone 10 mg daily and uptitrate depending on his day-to-day symptoms.  He is otherwise managed on Symbicort and Spiriva.  Desaturated on arrival today after ambulating to the exam room. He reports more dyspnea, has increased pred intermittently.    OV 12/05/2017  Chief Complaint  Patient presents with   Follow-up    Switching from RB to MR.   Pt is on 3L with exertion and 2L at rest. Pt states his breathing had  become worse but since he has been on O2 since 3/21, it has really helped with his breathing.  Pt was in hospital 4/28-5/3. Other than SOB, pt does have c/o cough, rattling in chest. Denies any CP/chest tightness.   History from patient, his wife and review of the old chart  Talmadge Coventry IIIs a transfer of care from [name] to myself [name].  His son is my patient and therefore he is done this transfer of care.  According to the patient he is to be seen by [name] for COPD.  Patient is FEV1 34% based on March 2011 PFTs according to chart review.  At baseline he is maintained on Spiriva, Symbicort, Singulair, chronic daily prednisone 10 mg/day for at least 3 years, and 3 times a week of azithromycin for 2 years.  Earlier this year in March 2019 he went on daytime oxygen and following a recent hospitalization April-May 2019.  For syncope that was believed due to nocturnal hypoxemia he was started on night oxygen.  Since starting oxygen his edema has improved and his overall quality of life is improved and he stopped having any orthostasis or presyncopal episodes.  He is grateful for the fact that he is on oxygen.  His current COPD CAT score and severity of symptoms is rated below and is 26.  Show significant amount of symptom burden.  His main goal is to improve  his quality of life.  He had his wife have many questions centering around quality of life, medication therapy.  Of note he has had some thoracic vertebral fractures that are new.  This happened after the fall in late April 2019.  Discovered at admission last month.  He feels is due to prednisone.  He is wondering about portable oxygen.   OV 01/29/2018  Chief Complaint  Patient presents with   Follow-up    Pt states he has been doing okay since last visit. States breathing is about the same, has an occ cough but states the O2 has helped out a lot with breathing and cough. Denies any complaints of CP.   Lesia Sago II , 75 y.o. ,  with dob 1946/02/24 and male ,Not Hispanic or Latino from [place] [id number] - presents to lung clinic for advanced COPD follow-up. Since his last visit his symptoms course of improvement. Score is 26 and severe symptoms of document below. He is on Spiriva, Symbicort, continues oxygen, daily prednisone and azithromycin 3 times a week. He has stopped his singulair without any problems. Overall he says he is better. He is willing to give TRELEGY inhaler trial. Last visit to check blood gas and he does not have hypercapnia so he does not qualify for BiPAP        OV 04/03/2018  Subjective:  Patient ID: Francisco [name], male , DOB: 06-11-1946 , age 75 y.o. , MRN: [id number] , ADDRESS: [place] [address]   04/03/2018 -   Chief Complaint  Patient presents with   Acute Visit    Pt's O2 sats have been dropping into the 70s with little exertion.  Pt took O2 off just to shave 9/26 and after he finished shaving put the O2 back on, walked from the bathroom down to the living room on O2 and sats were at 77% on 3L 9/26. Pt also has c/o cough with white to yellow mucus and chest tightness with the SOB.     HPI KAYLAN FRIEDMANN II 75 y.o. -acute visit for this patient.  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  He is chronically prednisone dependent.  He also is on schedule azithromycin.  He called in yesterday feeling unwell therefore we asked him to come in today.  He tells me that approximately a week ago he started having increased cough and congestion.  Yesterday primary care physician following a routine visit thought he was in COPD exacerbation and started him on doxycycline.  He also personally bumped up his baseline prednisone.  He does me that yesterday while he was shaving on room air he felt more short of breath than usual.  Also when he walked he desaturated more than usual therefore he decided to call in and come in today.   There is no fever or chills or hemoptysis or colored sputum.  No leg edema.  No orthopnea.  He does not feel like he needs to be in the emergency department or get admitted.   OV 05/11/2018  Subjective:  Patient ID: Francisco [name], male , DOB: 1945/12/04 , age 34 y.o. , MRN: [id number] , ADDRESS: [place] [address]   05/11/2018 -   Chief Complaint  Patient presents with   Follow-up    pt states breathing has wrosen since last OV. increased sob, occ chest tightness, prod cough with yellow mucus &  chest congestion. currently taking trelegy samples. recent admission 04/07/18 for PE.     HPI Lesia Sago II 75 y.o. -advanced COPD with chronic hypoxemic respiratory failure [not a BiPAP candidate because of lack of hypercapnia] presents with his wife for follow-up.  After the last visit he continued to have symptoms so on April 07, 2018 he got an outpatient CT scan and he had pulmonary embolism.  He got admitted to the hospital and then discharged and since then has been on Xarelto.  He continues his baseline 2-3 L of nasal cannula oxygen but he says since then he is more symptomatic.  Is also corresponds with him starting Trelegy inhaler and also for the last few to several weeks is having increased cough and congestion and yellow phlegm.  His COPD CAT score is deteriorated to 35 and above.  He is very frustrated by his symptoms.  Review of his medication shows that he takes azithromycin and chronic prednisone and Trelegy and oxygen for his COPD.  He is on Xarelto for his blood clots.  He is also on Fosamax for prednisone dependent osteoporosis management.  His smoking is in remission.  Wife says he is always sedentary.  Apparently they tried to do some physical therapy a month ago but he desaturated.  He does not seem to keen on physical therapy right now     OV 06/10/2018  Subjective:  Patient ID: Francisco [name], male , DOB: Oct 18, 1945 , age 20 y.o. , MRN: [id number] ,  ADDRESS: [place] [address]   06/10/2018 -   Chief Complaint  Patient presents with   Follow-up    follows for COPD. patient has increased SOB with exertion     HPI Lesia Sago II 75 y.o. -returns for gold stage IV COPD follow-up with chronic hypoxemic respiratory failure.  I saw him approximately a month ago at which time recommended Trelegy.  However the Trelegy is not working well for him.  He feels Spiriva and Symbicort is of benefit for him.  Also recommended he start himself on Daliresp.  Given the GI side effect profile I communicated with his primary care physician about stopping Fosamax.  His primary care physician has advised Reclast infusion for him and is considering this but has not yet switched over.  This because the infusions will have to happen at [facility] long.  However he never started his Daliresp because he read the side effect profile and was worried about back pain.  He wanted me to go over the side effect profile of Daliresp all over again.  I printed up-to-date and went over all the side effects.  I did this with him and his wife.  There is a 3% incidence of backache.  The dominant feature is GI issues of weight loss and diarrhea and nausea and abdominal pain.  The second dominant feature is some anxiety issues.  After reading all this he has decided to start [place].  He is aware of the limitations as well.  He is aware that this is purely preventive in reducing COPD exacerbation frequency.  He understands that the burden on his health from frequent COPD exacerbations is high.  Currently COPD CAT score is back at baseline        OV 07/22/2018  Subjective:  Patient ID: Francisco [name], male , DOB: 10-26-1945 , age 35 y.o. , MRN: [id number] , ADDRESS: [place] [address]   07/22/2018 -  Chief Complaint  Patient presents with   Follow-up    Pt states it has been rough since last visit. States he has been coughing with  white to yellow phlegm, wheezing, increased SOB which has been going on x2-3 weeks now. Pt denies any real complaints of chest tightness/chest pain.     HPI WESTEN DININO II 75 y.o. -presents for follow-up of his advanced COPD with chronic hypoxemic respiratory failure.  At last visit we introduce Daliresp as is an effort to prevent COPD flareups.  After some trepidation he started taking it.  He is currently on full dose 5 mcg daily and is tolerating it well.  However he does not think it is reduced the frequency of flareups but again it is too soon to tell.  Approximately over a week ago after some sick contact exposure he had symptoms and signs of COPD exacerbation.  He was given I suspect Levaquin but this caused some confusion.  After that he was changed to doxycycline.  Today's his last of 7 days with doxycycline he is better.  COPD CAT score is improved to 26 but he still is coughing quite a bit and has mucus and feels that he is still not fully back to baseline.  He had a chest x-ray July 13, 2018 that reports this is clear.  He continues on oxygen Spiriva and Symbicort.  There are no other new issues.  No chest pain edema hemoptysis or weight loss.    OV 03/05/2019  Subjective:  Patient ID: Francisco [name], male , DOB: [birth year] , age 85 y.o. , MRN: [id number] , ADDRESS: [place] [address]   03/05/2019 -   Chief Complaint  Patient presents with   COPD with acute exacerbation    Feels it is a little worse since June. Has cough with congestion, was white in color. But this morning it has a yellow and green color to it.     HPI EDKER PUNT II 75 y.o. -returns for his advanced COPD follow-up.  He says that since his last visit he has been doing overall fair and stable.  Uses 3 L of oxygen, Daliresp, Spiriva, Symbicort.  He has been avoiding risk activities for COVID-19.  He says in the last few weeks he has had increased congestion he does not think this is  COVID-19.  Today he has green-yellow sputum.  He is asking about taking a flu shot.    OV 05/07/2019  Subjective:  Patient ID: Francisco [name], male , DOB: Oct 12, 1945 , age 42 y.o. , MRN: [id number] , ADDRESS: [place] [address]   05/07/2019 -   Chief Complaint  Patient presents with   Follow-up    Pt states his breathing is at his baseline. Pt c/o prod cough with clear mucus - baseline for pt. Pt denies CP/tightness and f/c/s.    Advance COPD with chronic hypoxemic respiratory failure.  Baseline functional status ECOG 3-4  HPI Lesia Sago II 75 y.o. -presents for COPD follow-up.  He continues to be stable using oxygen.  His current COPD CAT score is 30 which is his baseline.  He recently had a vascular surgery.  He is on oxygen, prednisone, Daliresp, Spiriva and Symbicort.  He is gone back to taking azithromycin for prophylaxis m again COPD exacerbation.        OV 11/11/2019  Subjective:  Patient ID: Francisco [name], male , DOB: 1946-03-03 ,  age 81 y.o. , MRN: [id number] , ADDRESS: [place] [address]   11/11/2019 -   Chief Complaint  Patient presents with   Follow-up    Pt states his breathing is progressively becoming worse and states he feels like he may be in a flare with the COPD. Pt states he is coughing up yellow-green phlegm and also has complaints of wheezing.   Advance COPD patient with recurrent COPD exacerbations.  HPI SEYDINA HOLLIMAN II 75 y.o. -presents for follow-up.  He says last month he saw [name] nurse practitioner.  He says he was given a different antibiotic that really helped.  Review of the records indicate one time it was Augmentin another time it was [place].  He believes the second 1 really helped.  He feels in another exacerbation.  He is having green sputum.  He says with the flutter valve and exacerbation a lot of sputum comes out but between exacerbations is very minimal.  He is frustrated by his  recurrent exacerbations.  He did not tolerate Trelegy in the past.  He is on Spiriva, Symbicort, oxygen 3 L, flutter valve, Flonase, Roflumilast and 3 times a week of azithromycin.  He is looking to see if he can improve his quality of life.  His COPD CAT score is 34.  His last CT scan of the chest was in 2019.     CAT COPD Symptom & Quality of Life Score (GSK trademark) 0 is no burden. 5 is highest burden 12/05/2017  01/29/2018  05/11/2018  06/10/2018  07/22/2018  03/05/2019  05/07/2019   Never Cough -> Cough all the time 3 1 5 5 2 3 3   No phlegm in chest -> Chest is full of phlegm 2 2 4 5 3  3.5 3  No chest tightness -> Chest feels very tight 1 0 3 0 1 2 3   No dyspnea for 1 flight stairs/hill -> Very dyspneic for 1 flight of stairs 5 4 5 5 5 5 5   No limitations for ADL at home -> Very limited with ADL at home 5 5 5 5 5 5 4   Confident leaving home -> Not at all confident leaving home 3 3 3 4 4 3 4   Sleep soundly -> Do not sleep soundly because of lung condition 3 1 2  0 1 4 4   Lots of Energy -> No energy at all 4 4 5 5 5 4 4   TOTAL Score (max 40)  26 20 37 29 26 29.5 30   CAT Score 11/11/2019 03/05/2019 04/03/2018  Total CAT Score 32 30 30     OV 11/26/2019 - telephone visit. 2PHI identified. Risks, benefit, limitation explained  Subjective:  Patient ID: Francisco [name], male , DOB: 1946/06/26 , age 40 y.o. , MRN: [id number] , ADDRESS: [place] [address] Advanced COPD with recurrent exacerbation now colonized by pseudomonas  11/26/2019 -    HPI HERO KULISH II 75 y.o. -last seen by myself Nov 11, 2019.  At that time we decided to do a work-up for recurrent exacerbations.  His immunoglobulin levels are normal.  His sputum grew Pseudomonas that is pansensitive.  He CT scan of the chest shows right upper lobe worsening fibrotic pattern.  There is no mass per se.  I personally visualized the CT chest.  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  Today in the visit he tells me after 3 days he had nausea and then he had joint pain on the third day and quit taking it.  He still continues to be symptomatic.     ROS - per HPI  CT Chest Wo Contrast  Result Date: 11/25/2019 CLINICAL DATA:  COPD exacerbation. Severe shortness of breath. EXAM: CT CHEST WITHOUT CONTRAST TECHNIQUE: Multidetector CT imaging of the chest was performed following the standard protocol without IV contrast. COMPARISON:  04/07/2018. FINDINGS: Cardiovascular: The heart size is normal. There is no pericardial effusion. Aortic atherosclerosis. Three vessel coronary artery atherosclerotic calcifications identified Mediastinum/Nodes: No enlarged mediastinal or axillary lymph nodes. Thyroid gland, trachea, and esophagus demonstrate no significant findings. Lungs/Pleura: No pleural effusion identified. Advanced changes of centrilobular and paraseptal emphysema with mild bullous changes. Progressive confluent fibrotic with extensive architectural distortion and volume loss no acute airspace consolidation, atelectasis or pneumothorax. Scattered, bilateral areas of pleuroparenchymal scarring identified changes within the apical portions of the right upper lobe are again noted. Upper Abdomen: Bilateral low-attenuation adrenal nodules appears similar to previous exam and are compatible with benign adenomas. No acute abnormality identified within the imaged portions of the upper abdomen. Musculoskeletal: Mild scoliosis and degenerative disc disease noted within the thoracic spine. Stable chronic compression deformities at T6, T7 and T8. No new compression fractures. IMPRESSION: 1. No acute cardiopulmonary abnormalities. 2. Progressive confluent fibrotic changes within the right upper lobe with extensive architectural distortion and volume loss. Likely the sequelae of prior inflammation/infection. 3. Aortic atherosclerosis,  in addition to 3 vessel coronary artery disease. Please note that although the presence of coronary artery calcium documents the presence of coronary artery disease, the severity of this disease and any potential stenosis cannot be assessed on this non-gated CT examination. Assessment for potential risk factor modification, dietary therapy or pharmacologic therapy may be warranted, if clinically indicated. 4. Bilateral adrenal adenomas. 5. Stable chronic compression deformities at T6, T7 and T8. 6. Diffuse bronchial wall thickening with emphysema, as above; imaging findings suggestive of underlying COPD. Aortic Atherosclerosis (ICD10-I70.0) and Emphysema (ICD10-J43.9). Electronically Signed   By: Kerby Moors M.D.   On: 11/25/2019 12:21     OV 05/03/2020  Subjective:  Patient ID: Francisco [name], male , DOB: 08-02-1945 , age 23 y.o. , MRN: [id number] , ADDRESS: [place] [address] PCP [name], MD Patient Care Team: [name], MD as PCP - General (Family Medicine) Brand Males, MD as Consulting Physician (Pulmonary Disease) Danella Sensing, MD as Consulting Physician (Dermatology) Dr. Henreitta Leber, DDS (Dentistry)  This Provider for this visit: Treatment Team:  Attending Provider: Brand Males, MD    05/03/2020 -   Chief Complaint  Patient presents with   Follow-up    doing best he has in a year and a half.     ICD-10-CM   1. Stage 4 very severe COPD by GOLD classification ([facility])  J44.9   2. Chronic respiratory failure with hypoxia (HCC)  J96.11   3. COPD, frequent exacerbations ([place])  J44.1   4. Abnormal findings on diagnostic imaging of lung  R91.8   5. Pulmonary embolism, unspecified chronicity, unspecified pulmonary embolism type, unspecified whether acute cor pulmonale present (HCC)  I26.99   6. Vaccine counseling  Z71.85      HPI YUMA PACELLA II 75 y.o. -presents with his wife for the above issues.  He said his Covid booster  has had his flu shot.  He has follow-up  coming end of November with primary care physician.  He wants me to send a note to the primary care physician.  He is without any exacerbations.  He continues prednisone, azithromycin, Roflumilast, Spiriva and Symbicort.  He does not want to do triple inhaler therapies.  He is on anticoagulation.  He says after the GI bleeding issues resolved he is better.  He had a CT scan of the chest that is documented below.  His infiltrates are improving but there is mucus/debris in the main airway he is denying any aspiration.  He does have hiatal hernia.  Apparently this was discovered during endoscopy.  CAT Score 05/03/2020 11/11/2019 03/05/2019 04/03/2018  Total CAT Score 16 32 30 30     IMPRESSION: 1. Trace right pleural effusion and associated atelectasis or consolidation, both of which are improved compared to prior examination. Findings are generally consistent with resolving sequelae of prior infection or aspiration. 2. Diffuse bilateral bronchial wall thickening consistent with nonspecific infectious or inflammatory bronchitis. 3. Frothy debris in the bilateral mainstem bronchi, concerning for aspiration. 4. Severe centrilobular emphysema. Emphysema (ICD10-J43.9). 5. Nonspecific fibrotic scarring of the right greater than left lung apices, likely sequelae of prior infection or inflammation. 6. Coronary artery disease.  Aortic Atherosclerosis (ICD10-I70.0).     Electronically Signed   By: Eddie Candle M.D.   On: 04/26/2020 13:42   ROS - per HPI  OV 08/31/2020  Subjective:  Patient ID: Francisco [name], male , DOB: 02-18-1946 , age 44 y.o. , MRN: [id number] , ADDRESS: [place] [address] PCP [name], MD Patient Care Team: [name], MD as PCP - General (Family Medicine) Brand Males, MD as Consulting Physician (Pulmonary Disease) Danella Sensing, MD as Consulting Physician (Dermatology) Dr. Henreitta Leber,  DDS (Dentistry)  This Provider for this visit: Treatment Team:  Attending Provider: Brand Males, MD    08/31/2020 -   Chief Complaint  Patient presents with   Follow-up    More congestion at the moment, more SOB   Advanced end-stage COPD on oxygen and ECOG 3-4 History of pulmonary embolism on anticoagulation  HPI Lesia Sago II 75 y.o. -presents for follow-up.  Presents with his wife he sitting on wheelchair.  He says for the last few weeks he is having a flareup with increased urine production that is green.  He wants antibiotics.  He says in December 2021 I switched his Omnicef to ciprofloxacin based on Pseudomonas and sensitivity but he actually felt the [place] worked better.  However he said that the duration again was too short and he actually wants Omnicef again for 14 days at this time.  He also thinks a prednisone taper/burst would help him.  Otherwise he continues on Spiriva Symbicort Daliresp 3 times weekly azithromycin and daily prednisone 10 mg.  Is also on oxygen.  He is on anticoagulation for his pulmonary embolism.  He fell down and broke his back but did not have any bleeding.  He is going to have an MRI.  Noted that he is not on any Mucinex and is going to start this after my advice.  We discussed about switching his Breo and Symbicort to a triple inhaler combination either Trelegy or BREZTRI but he says both of not working he does not want to do it.   OV 04/26/2021  Subjective:  Patient ID: Francisco [name], male , DOB: August 16, 1945 , age 74 y.o. , MRN: [id number] , ADDRESS: [place] [id number] PCP [name], MD Patient Care Team: [name], MD as PCP - General (Family Medicine) Brand Males, MD as Consulting Physician (Pulmonary Disease) Danella Sensing, MD as Consulting Physician (Dermatology) Dr. Henreitta Leber, DDS (Dentistry)  This Provider for this visit: Treatment Team:  Attending Provider: Brand Males,  MD    04/26/2021 -   Chief Complaint  Patient presents with   Follow-up    Pt states he is about the same since last visit. Has problems with coughing and will get up occ white to yellow phlegm and also has had some wheezing.    Advanced end-stage COPD on oxygen and ECOG 3-4 - last CT Oct 2021 History of pulmonary embolism on anticoagulation - June 2021 Iron def anemia - follows Dr Julien Nordmann  HPI Lesia Sago II 75 y.o. -returns for follow-up with his wife.  His ECOG continues to be 3-4.  He is wheelchair and minimal activities.  He is on oxygen, Spiriva, Symbicort, azithromycin for preventing COPD exacerbations, Daliresp, daily prednisone.  Is not using 3% saline nebulizer.  Instead he says flutter valve works well for him.  Last seen in February 2022 and since then no exacerbations.  The longest time he has had without exacerbation he is happy about it.  He has some waxing and waning sputum production.  Few days ago it was yellow but now clear.  He is going to monitor this.  He is up-to-date with his COVID vaccines.  He is no longer following with Dr. Julien Nordmann for his iron deficiency anemia.  He is on anticoagulation for his PE in June 2021.    CT Chest data  No results found.    PFT  No flowsheet data found.     has a past medical history of Chronic back pain, Chronic rhinitis, Compressed spine fracture ([place]), COPD (chronic obstructive pulmonary disease) ([place]), Emphysema lung ([place]), On home oxygen therapy, Onychomycosis, Orthostatic hypotension, PAD (peripheral artery disease) ([place]), Pneumonia, Pulmonary embolism ([place]) (04/07/2018), Skin cancer, and Vertigo.   reports that he quit smoking about 3 years ago. His smoking use included pipe and cigarettes. He started smoking about 55 years ago. He has a 104.00 pack-year smoking history. He has never used smokeless tobacco.  Past Surgical History:  Procedure Laterality Date   ABDOMINAL AORTOGRAM W/LOWER EXTREMITY N/A 01/21/2020    Procedure: ABDOMINAL AORTOGRAM W/LOWER EXTREMITY;  Surgeon: Elam Dutch, MD;  Location: [facility];  Service: Cardiovascular;  Laterality: N/A;   APPENDECTOMY     BIOPSY  02/20/2020   Procedure: BIOPSY;  Surgeon: Rush Landmark Telford Nab., MD;  Location: [facility];  Service: Gastroenterology;;   CATARACT EXTRACTION, BILATERAL     ENDARTERECTOMY FEMORAL Right 04/19/2019   Procedure: ENDARTERECTOMY RIGHT COMMON FEMORAL;  Surgeon: Elam Dutch, MD;  Location: [facility];  Service: Vascular;  Laterality: Right;   ENDARTERECTOMY FEMORAL Left 01/24/2020   Procedure: LEFT FEMORAL ENDARTERECTOMY WITH DACRON PATCH ANGIOPLASTY;  Surgeon: Elam Dutch, MD;  [facility];  Service: Vascular;  Laterality: Left;   ESOPHAGOGASTRODUODENOSCOPY (EGD) WITH PROPOFOL N/A 02/20/2020   Procedure: ESOPHAGOGASTRODUODENOSCOPY (EGD) WITH PROPOFOL;  Surgeon: Irving Copas., MD;  Location: [facility];  Service: Gastroenterology;  Laterality: N/A;   FEMORAL ENDARTERECTOMY Left 01/24/2020   HEMOSTASIS CLIP PLACEMENT  02/20/2020   Procedure: HEMOSTASIS CLIP PLACEMENT;  Surgeon: Irving Copas., MD;  Location: [facility];  Service: Gastroenterology;;   HOT HEMOSTASIS N/A 02/20/2020   Procedure: HOT HEMOSTASIS (ARGON PLASMA COAGULATION/BICAP);  Surgeon: Irving Copas., MD;  Location: [facility];  Service: Gastroenterology;  Laterality: N/A;   INSERTION OF ILIAC STENT Left 01/24/2020   Procedure: INSERTION OF VBX STENT 8X59 AND 8X39 LEFT COMMON ILIAC ARTERY. INSERTION OF INNOVA 7 X 60 INNOVA STENT LEFT EXTERNAL ILIAC ARTERY. ;  Surgeon: Elam Dutch, MD;  Location: [facility];  Service: Vascular;  Laterality: Left;   LOWER EXTREMITY ANGIOGRAPHY  12/11/2018   LOWER EXTREMITY ANGIOGRAPHY N/A 12/11/2018   Procedure: LOWER EXTREMITY ANGIOGRAPHY;  Surgeon: Elam Dutch, MD;  Location: [facility];  Service: Cardiovascular;  Laterality: N/A;   PATCH ANGIOPLASTY Right 04/19/2019    Procedure: Patch Angioplasty;  Surgeon: Elam Dutch, MD;  Location: [facility];  Service: Vascular;  Laterality: Right;   PERIPHERAL VASCULAR INTERVENTION Right 12/11/2018   Procedure: PERIPHERAL VASCULAR INTERVENTION;  Surgeon: Elam Dutch, MD;  Location: [facility];  Service: Cardiovascular;  Laterality: Right;  Common Iliac    SKIN CANCER EXCISION     "lips, face, ears, arms" (04/07/2018)   TONSILLECTOMY     ULTRASOUND GUIDANCE FOR VASCULAR ACCESS Right 01/24/2020   Procedure: ULTRASOUND GUIDANCE FOR VASCULAR ACCESS;  Surgeon: Elam Dutch, MD;  Location: [facility];  Service: Vascular;  Laterality: Right;    Allergies  Allergen Reactions   Tape Other (See Comments)    SKIN IS VERY THIN, TEARS SKIN; CAN ONLY USE COBAN WRAPS DUE TO CONDITION OF SKIN!!   Ciprofloxacin Nausea Only    Sick on stomach, weak/tired   Levaquin [Levofloxacin] Other (See Comments)    hallucinations   Lipitor [Atorvastatin] Rash    Immunization History  Administered Date(s) Administered   Fluad Quad(high Dose 65+) 03/16/2019, 03/10/2020   Influenza Split 04/24/2012   Influenza Whole 05/08/2009, 04/08/2011   Influenza, High Dose Seasonal PF 05/01/2017, 03/31/2018, 04/17/2021   Influenza,inj,Quad PF,6+ Mos 04/27/2013, 04/15/2014, 04/24/2015   Influenza,inj,quad, With Preservative 05/01/2018   Influenza-Unspecified 05/13/2016   PFIZER Comirnaty(Gray Top)Covid-19 Tri-Sucrose Vaccine 11/24/2020   PFIZER(Purple Top)SARS-COV-2 Vaccination 07/30/2019, 08/20/2019, 04/07/2020   Pfizer Covid-19 Vaccine Bivalent Booster 76yrs & up 03/16/2021   Pneumococcal Conjugate-13 02/25/2014   Pneumococcal Polysaccharide-23 08/15/2011, 12/19/2016   Pneumococcal-Unspecified 03/08/2017   Td 02/21/2010   Tdap 11/05/2017   Zoster, Live 01/29/2012    Family History  Problem Relation Age of Onset   Heart disease Mother        CABG in her 29s, nonsmoker   Cancer Mother    Stroke Father    Heart disease Father         Died of MI at age 55, smoker   Hepatitis Sister    Coronary artery disease Other        male 1st degree relative <60   Colon cancer Neg Hx    Pancreatic cancer Neg Hx    Esophageal cancer Neg Hx    Inflammatory bowel disease Neg Hx    Liver disease Neg Hx    Rectal cancer Neg Hx    Stomach cancer Neg Hx      Current Outpatient Medications:    acetaminophen (TYLENOL) 500 MG tablet, Take 1,000 mg by mouth every 6 (six) hours as needed for moderate pain. , Disp: , Rfl:    albuterol (VENTOLIN HFA) 108 (90 Base) MCG/ACT inhaler, USE 2 PUFFS EVERY 6 HOURS  AS NEEDED FOR WHEEZING, Disp: 18 g, Rfl: 3   apixaban (ELIQUIS) 2.5 MG TABS tablet, Take 1 tablet (2.5 mg total) by mouth 2 (two) times  daily., Disp: 180 tablet, Rfl: 1   azithromycin (ZITHROMAX) 250 MG tablet, TAKE 1 TABLET BY MOUTH  EVERY MONDAY, WEDNESDAY,  AND FRIDAY, Disp: 24 tablet, Rfl: 1   Calcium Carbonate-Vitamin D (CALCIUM 600+D PO), Take 2 tablets by mouth daily., Disp: , Rfl:    cycloSPORINE (RESTASIS) 0.05 % ophthalmic emulsion, Place 1 drop into both eyes 2 (two) times daily as needed (dry eyes). , Disp: , Rfl:    DALIRESP 500 MCG TABS tablet, TAKE 1 TABLET BY MOUTH  DAILY, Disp: 90 tablet, Rfl: 3   Ferrous Sulfate (IRON) 325 (65 Fe) MG TABS, Take 1 tablet by mouth daily., Disp: , Rfl:    fluticasone (FLONASE) 50 MCG/ACT nasal spray, Place 2 sprays into both nostrils daily., Disp: 48 g, Rfl: 3   folic acid (FOLVITE) 1 MG tablet, Take 1 tablet (1 mg total) by mouth daily., Disp: , Rfl:    furosemide (LASIX) 20 MG tablet, TAKE 1 TABLET (20 MG TOTAL) BY MOUTH DAILY AS NEEDED FOR FLUID OR EDEMA., Disp: 90 tablet, Rfl: 3   HYDROcodone-acetaminophen (NORCO/VICODIN) 5-325 MG tablet, Take 2 tablets by mouth as needed., Disp: , Rfl:    Multiple Vitamin (MULTIVITAMIN WITH MINERALS) TABS tablet, Take 1 tablet by mouth daily., Disp: , Rfl:    mupirocin cream (BACTROBAN) 2 %, Apply 1 application topically 2 (two) times daily.,  Disp: 22 g, Rfl: 0   OXYGEN, Inhale 4-5 L into the lungs continuous. , Disp: , Rfl:    pantoprazole (PROTONIX) 20 MG tablet, Take 1 tablet (20 mg total) by mouth daily., Disp: 90 tablet, Rfl: 1   predniSONE (DELTASONE) 10 MG tablet, Take 20mg  for 1 week then resume 10mg  daily., Disp: 90 tablet, Rfl: 2   Respiratory Therapy Supplies (FLUTTER) DEVI, 10 times Twice a day and prn as needed, may increase if feeling worse, Disp: 1 each, Rfl: 0   rosuvastatin (CRESTOR) 10 MG tablet, Take 1 tablet (10 mg total) by mouth daily., Disp: 90 tablet, Rfl: 1   SYMBICORT 160-4.5 MCG/ACT inhaler, USE 2 INHALATIONS BY MOUTH  TWICE DAILY, Disp: 30.6 g, Rfl: 3   Tiotropium Bromide Monohydrate (SPIRIVA RESPIMAT) 2.5 MCG/ACT AERS, USE 2 INHALATIONS BY MOUTH  DAILY, Disp: 12 g, Rfl: 3   traMADol (ULTRAM) 50 MG tablet, Take 1 tablet (50 mg total) by mouth every 12 (twelve) hours as needed for moderate pain or severe pain., Disp: 90 tablet, Rfl: 1   sodium chloride HYPERTONIC 3 % nebulizer solution, Take by nebulization daily. (Patient not taking: No sig reported), Disp: 90 mL, Rfl: 12      Objective:   Vitals:   04/26/21 1018  BP: 112/78  Pulse: (!) 104  Temp: 98.6 F (37 C)  TempSrc: Oral  SpO2: 97%  Weight: 175 lb (79.4 kg)  Height: 6' (1.829 m)    Estimated body mass index is 23.73 kg/m as calculated from the following:   Height as of this encounter: 6' (1.829 m).   Weight as of this encounter: 175 lb (79.4 kg).  @WEIGHTCHANGE @  Filed Weights   04/26/21 1018  Weight: 175 lb (79.4 kg)     Physical Exam Deconditioned looking.  Sitting on a wheelchair.  Cushingoid facies.  3+ chronic pitting pedal edema with discoloration.  Barrel chest but only end expiratory wheeze which seems to be coming from the throat.  Overall stable unchanged exam.  Full sentences normal heart sounds.      Assessment:       ICD-10-CM  1. Chronic respiratory failure with hypoxia (HCC)  J96.11     2. COPD, frequent  exacerbations ([place])  J44.1     3. Stage 4 very severe COPD by GOLD classification ([place])  J44.9     4. History of pulmonary embolus (PE)  Z86.711 D-Dimer, Quantitative    D-Dimer, Quantitative         Plan:     Patient Instructions     ICD-10-CM   1. Chronic respiratory failure with hypoxia (HCC)  J96.11     2. COPD, frequent exacerbations ([place])  J44.1     3. Stage 4 very severe COPD by GOLD classification ([place])  J44.9     4. History of pulmonary embolus (PE)  Z86.711       Stable - no flare up past few months  Currently having another exacerbation despite optimal COPD care/treatment  Plan - continue prednisone 10mg  once daily baseline   --Continue oxygen, Spiriva, Symbicort, Daliresp and 3 times weekly azithromycin and daily prednisone otherwise [during flareups hold azithromycin and during flareup to a different schedule of prednisone as above]  -Continue anticoagulation but check d-dimer 04/26/2021 to decide on length of anticoagulation  -continue flutter valve  - do CT chest without contrast in 3 months   Follow-up -3 months or sooner but after CT chest    SIGNATURE    Dr. Brand Males, M.D., F.C.C.P,  Pulmonary and Critical Care Medicine Staff Physician, [facility] Director - Interstitial Lung Disease  Program  [facility] [place], [place], [address]  Pager: [phone], If no answer or between  15:00h - 7:00h: call [phone] Telephone: [phone]  10:52 AM 04/26/2021

## 2021-05-15 ENCOUNTER — Encounter: Payer: Self-pay | Admitting: Primary Care

## 2021-05-15 ENCOUNTER — Telehealth: Payer: Self-pay | Admitting: Internal Medicine

## 2021-05-15 ENCOUNTER — Telehealth (INDEPENDENT_AMBULATORY_CARE_PROVIDER_SITE_OTHER): Payer: Medicare Other | Admitting: Primary Care

## 2021-05-15 DIAGNOSIS — J441 Chronic obstructive pulmonary disease with (acute) exacerbation: Secondary | ICD-10-CM | POA: Diagnosis not present

## 2021-05-15 MED ORDER — PREDNISONE 10 MG PO TABS: ORAL_TABLET | ORAL | 0 refills | Status: DC

## 2021-05-15 MED ORDER — CEFDINIR 300 MG PO CAPS: 300.0000 mg | ORAL_CAPSULE | Freq: Two times a day (BID) | ORAL | 0 refills | Status: DC

## 2021-05-15 NOTE — Progress Notes (Signed)
Virtual Visit via Video Note  I connected with Francisco [name] on 05/15/21 at  2:30 PM EST by a video enabled telemedicine application and verified that I am speaking with the correct person using two identifiers.  Location: Patient: Home Provider: Office    I discussed the limitations of evaluation and management by telemedicine and the availability of in person appointments. The patient expressed understanding and agreed to proceed.  History of Present Illness: 75 year male, former smoker quit 2019. PMH significant for severe COPD, chronic respiratory failure, chronic rhinitis, chronic pulmonary embolism, pulmonary nodule, HTN, diastolic dysfunction. Patient of [name], last seen on 04/26/21. Maintained on Symbicort 160 and Spiriva respimat. He is also taking Azithromycin MWF, Daliresp 500mg  daily and prednisone 10mg  daily.    Previous [facility] pulmonary encounter: 04/26/21 - [name]  Chief Complaint  Patient presents with   Follow-up    Pt states he is about the same since last visit. Has problems with coughing and will get up occ white to yellow phlegm and also has had some wheezing.    Advanced end-stage COPD on oxygen and ECOG 3-4 - last CT Oct 2021 History of pulmonary embolism on anticoagulation - June 2021 Iron def anemia - follows Dr Julien Nordmann  HPI Francisco [name] 75 y.o. -returns for follow-up with his wife.  His ECOG continues to be 3-4.  He is wheelchair and minimal activities.  He is on oxygen, Spiriva, Symbicort, azithromycin for preventing COPD exacerbations, Daliresp, daily prednisone.  Is not using 3% saline nebulizer.  Instead he says flutter valve works well for him.  Last seen in February 2022 and since then no exacerbations.  The longest time he has had without exacerbation he is happy about it.  He has some waxing and waning sputum production.  Few days ago it was yellow but now clear.  He is going to monitor this.  He is up-to-date with his COVID vaccines.   He is no longer following with Dr. Julien Nordmann for his iron deficiency anemia.  He is on anticoagulation for his PE in June 2021.  05/15/2021- Interim hx  Patient contacted today for acute virtual visit. Patient developed productive cough with yellow-mucus several days ago. Associated chest congestion and chest tightness. He is compliant with Symbicort 160 and Spiriva respimat. He is also taking Azithromycin MWF, Daliresp 500mg  daily and prednisone 10mg  daily. He is on 4L oxygen continously, no increased demand. O2 running 90s. He is up to date with covid booster and influenza vaccine.    Observations/Objective:  - Patient is alert and oriented. Wearing home oxygen. No overt respiratory distress, wheezing or cough   Assessment and Plan:  COPD, acute exacerbation: - Productive cough with associated chest tightness - Rx Omnicef 300mg  twice daily x 10 days, hold azithromycin while taking; Prednisone taper as directed then resume 10mg  daily  - Continue Symbicort 160, Spiriva and Singulair  Chronic respiratory failure: - No increased needs. Continue supplemental oxygen at 4L to maintain O2 >88-90%   Follow Up Instructions:  - Call or return if symptoms do not improve    I discussed the assessment and treatment plan with the patient. The patient was provided an opportunity to ask questions and all were answered. The patient agreed with the plan and demonstrated an understanding of the instructions.   The patient was advised to call back or seek an in-person evaluation if the symptoms worsen or if the condition fails to improve as anticipated.  I provided  22 minutes of non-face-to-face time during this encounter.   Martyn Ehrich, NP

## 2021-05-15 NOTE — Telephone Encounter (Signed)
I have made a mychart video visit per provider recommendations and I walked the patient through the process and she verbalized it back to me. Nothing further needed.

## 2021-05-15 NOTE — Patient Instructions (Addendum)
Omnicef 300mg  twice daily x 10 days, hold azithromycin while taking  Prednisone taper as directed then resume 10mg  daily Continue 4L supplemental oxygen to maintain O2 >88-90%

## 2021-05-15 NOTE — Telephone Encounter (Signed)
Called and spoke with pt and he stated that he did see MR a couple of weeks ago and he told him that if his congestion got worse to call and let him know.  He stated that he is now coughing with yellow/green sputum.  He stated that MR normally treats him with abx and pred taper.  Pt is requesting that this be sent in to the pharmacy.   Will route to [facility] to advise as MR is not on the scheduled today.  Thanks  Allergies  Allergen Reactions   Tape Other (See Comments)    SKIN IS VERY THIN, TEARS SKIN; CAN ONLY USE COBAN WRAPS DUE TO CONDITION OF SKIN!!   Ciprofloxacin Nausea Only    Sick on stomach, weak/tired   Levaquin [Levofloxacin] Other (See Comments)    hallucinations   Lipitor [Atorvastatin] Rash

## 2021-05-15 NOTE — Telephone Encounter (Signed)
Needs video visit or in person visit today

## 2021-05-20 ENCOUNTER — Other Ambulatory Visit: Payer: Self-pay | Admitting: Family Medicine

## 2021-07-04 ENCOUNTER — Telehealth: Payer: Self-pay | Admitting: Internal Medicine

## 2021-07-04 MED ORDER — ROFLUMILAST 500 MCG PO TABS: 500.0000 ug | ORAL_TABLET | Freq: Every day | ORAL | 0 refills | Status: DC

## 2021-07-04 NOTE — Telephone Encounter (Signed)
Called and spoke with patient's wife, Ralph Leyden (DPR), she states that [facility] is out of Daliresp and has been for 2 weeks and does not know when they will get more in, she is requesting a 90 day supple be sent to [facility] on [address] at [address].  Script sent.  Nothinf further needed.

## 2021-07-08 DIAGNOSIS — C3491 Malignant neoplasm of unspecified part of right bronchus or lung: Secondary | ICD-10-CM

## 2021-07-08 HISTORY — DX: Malignant neoplasm of unspecified part of right bronchus or lung: C34.91

## 2021-07-10 ENCOUNTER — Ambulatory Visit (INDEPENDENT_AMBULATORY_CARE_PROVIDER_SITE_OTHER)
Admission: RE | Admit: 2021-07-10 | Discharge: 2021-07-10 | Disposition: A | Discharge status: Home / Self Care | Payer: Medicare Other | Source: Ambulatory Visit | Attending: Internal Medicine | Admitting: Internal Medicine

## 2021-07-10 ENCOUNTER — Other Ambulatory Visit: Payer: Self-pay

## 2021-07-10 DIAGNOSIS — Z86711 Personal history of pulmonary embolism: Secondary | ICD-10-CM | POA: Diagnosis not present

## 2021-07-10 DIAGNOSIS — J449 Chronic obstructive pulmonary disease, unspecified: Secondary | ICD-10-CM

## 2021-07-10 DIAGNOSIS — J9611 Chronic respiratory failure with hypoxia: Secondary | ICD-10-CM | POA: Diagnosis not present

## 2021-07-10 IMAGING — CT CT CHEST W/O CM
2 of 4 series · 15 of 36 positions shown, 18 images · non-contrast
Comparison: [DATE]

CLINICAL DATA: COPD exacerbation. Respiratory failure with hypoxia.

EXAM:
CT CHEST WITHOUT CONTRAST
TECHNIQUE: Multidetector CT imaging of the chest was performed following the
standard protocol without IV contrast.

[Series 2: thorax · axial · 0.82mm/px · z∈[-337,-65]mm · 12 of 162 slices shown, 15 images]
[im 13/162  mediastinal]
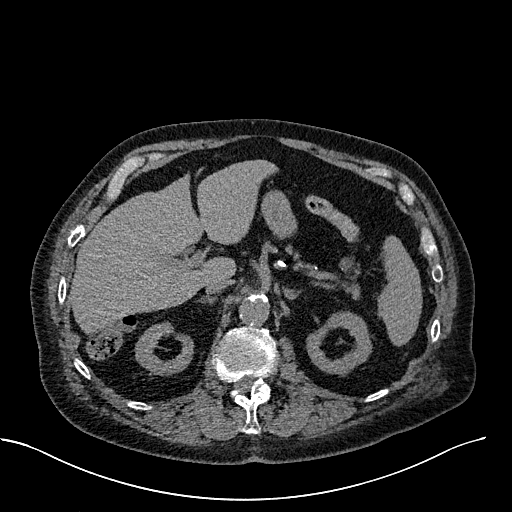
[im 13/162  lung]
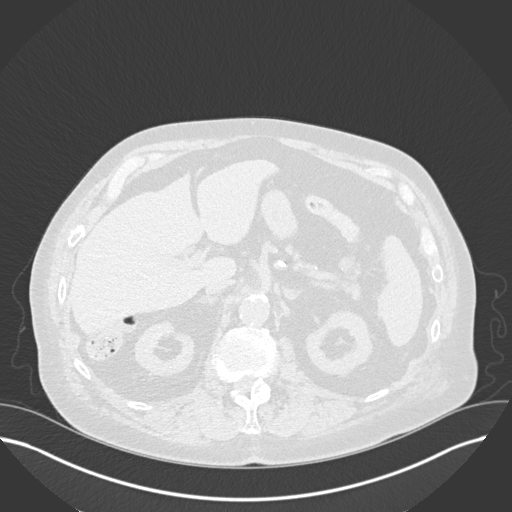
[im 25/162  lung]
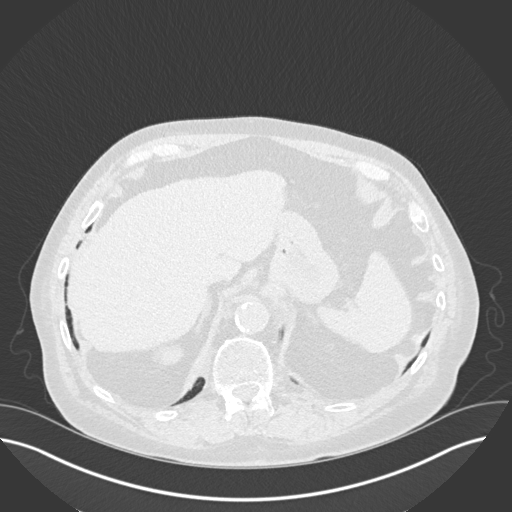
[im 38/162  lung]
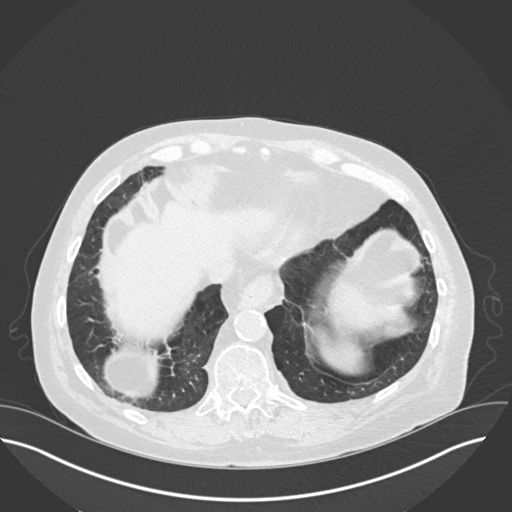
[im 50/162  lung]
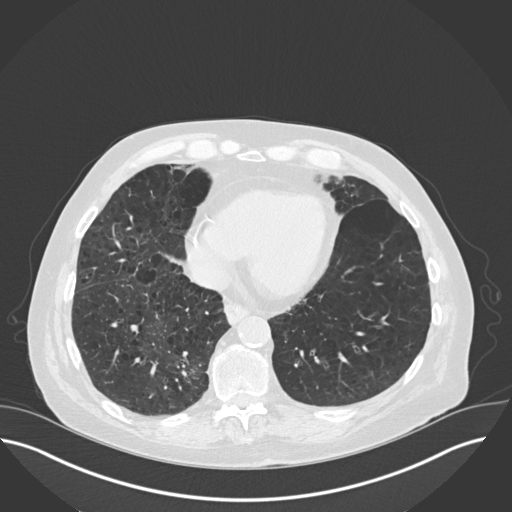
[im 62/162  mediastinal]
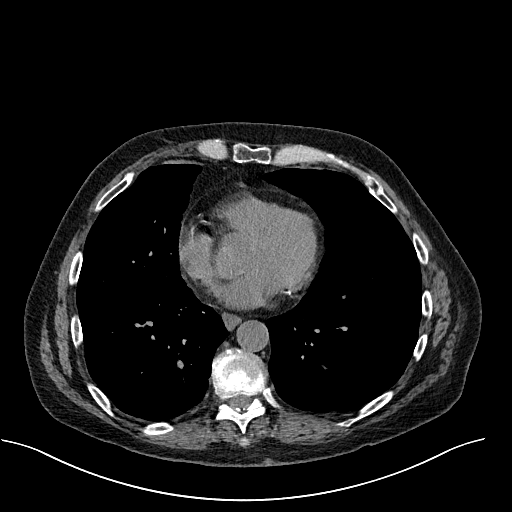
[im 62/162  lung]
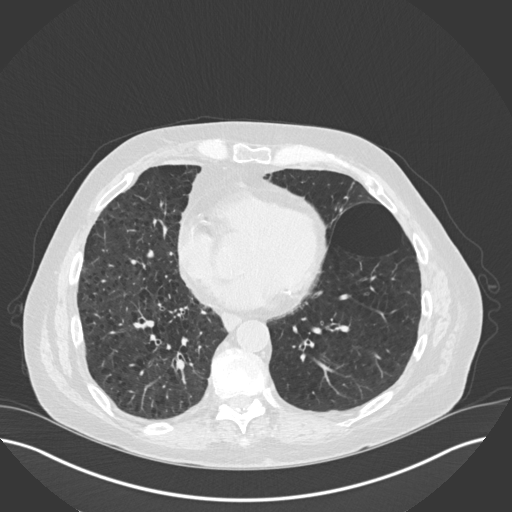
[im 75/162  lung]
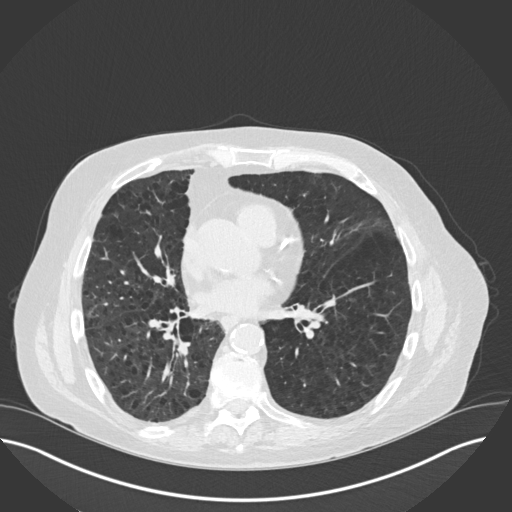
[im 87/162  lung]
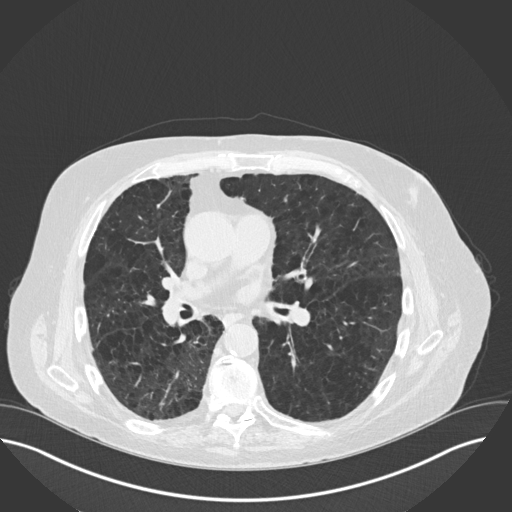
[im 100/162  lung]
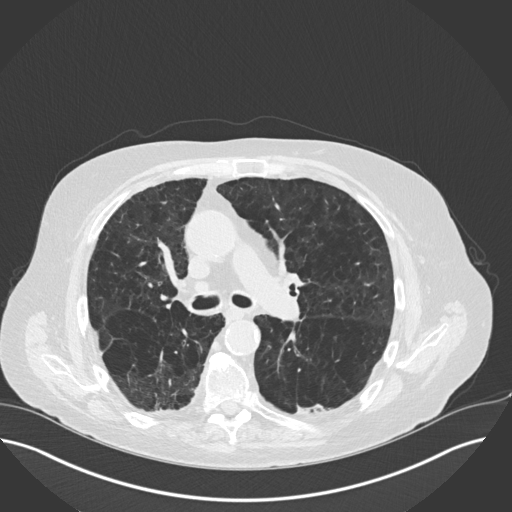
[im 112/162  mediastinal]
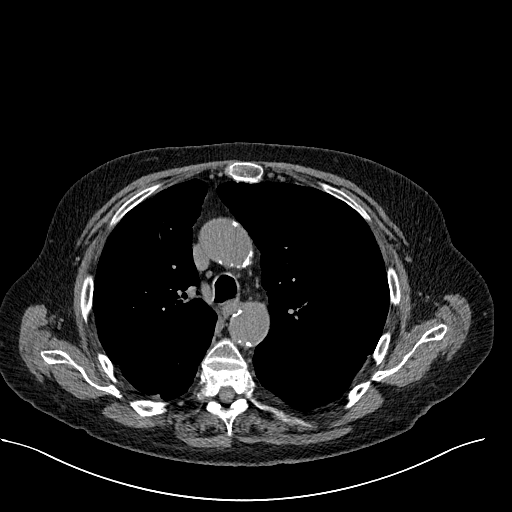
[im 112/162  lung]
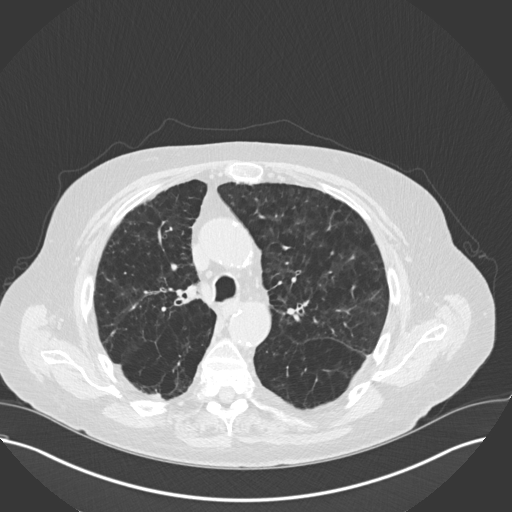
[im 124/162  lung]
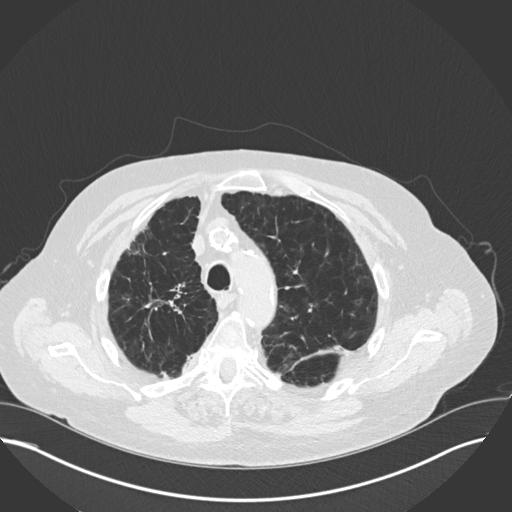
[im 137/162  lung]
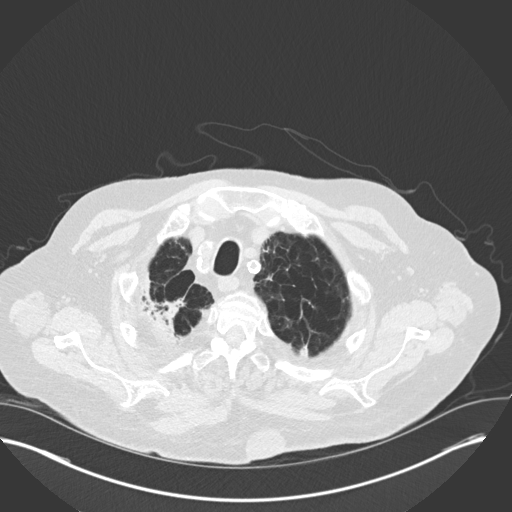
[im 149/162  lung]
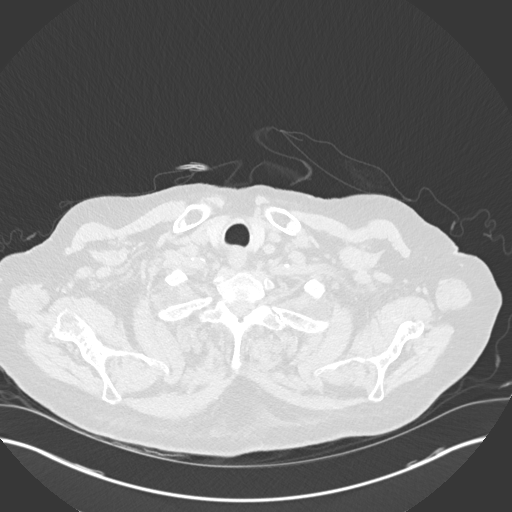

[Series 5: coronal · coronal · 0.63mm/px · 3 of 132 slices shown]
[im 27/132  lung]
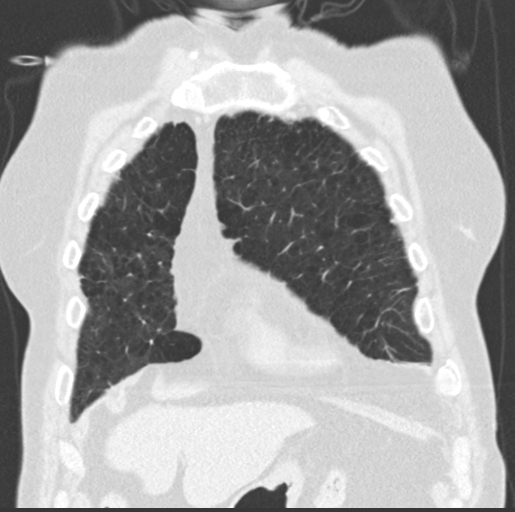
[im 53/132  lung]
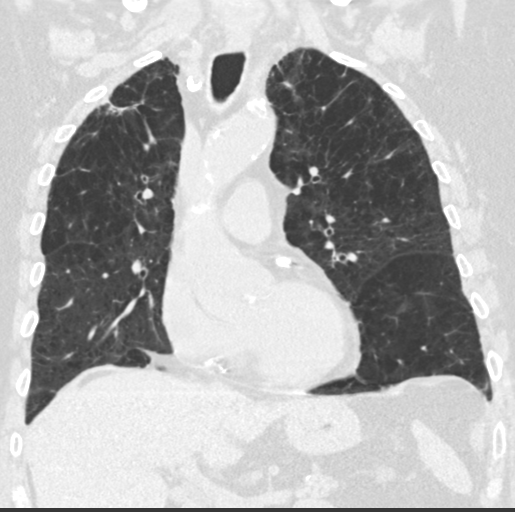
[im 79/132  lung]
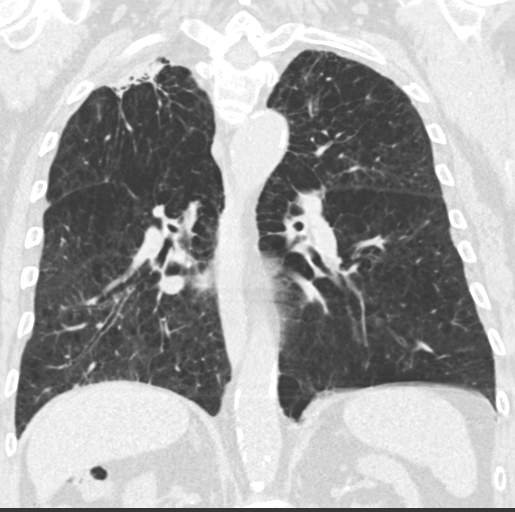

[15 of 36 positions shown; findings below may reference images not displayed]

FINDINGS: Cardiovascular: The heart size is within normal limits. No
pericardial effusion. Aortic atherosclerosis and coronary artery
calcifications.

Mediastinum/Nodes: No enlarged mediastinal or axillary lymph nodes.
Thyroid gland, trachea, and esophagus demonstrate no significant
findings.

Lungs/Pleura: Severe changes of centrilobular and paraseptal
emphysema. Diffuse bronchial wall thickening. No pleural effusion or
acute airspace consolidation. No interstitial edema. Scarring and
masslike architectural distortion within the right apex appears
unchanged from the previous exam. Within the superior segment of
right lower lobe there is a lung nodule measuring 1.1 x 0.6 cm (mean
diameter 9 mm), image 82/3. New compared with the previous exam.

Upper Abdomen: No acute findings within the imaged portions of the
upper abdomen. Aortic atherosclerosis.

Musculoskeletal: Curvature of the thoracic spine is convex towards
the right. Compression fractures are again noted involving T6, T7
and T8. Unchanged from previous exam.
IMPRESSION: 1. No acute cardiopulmonary abnormalities.
2. There is a new pulmonary nodule within the superior segment of
right lower lobe with a mean diameter of 9 mm. Consider a
non-contrast Chest CT at 3 months, a PET/CT, or tissue sampling.
These guidelines do not apply to immunocompromised patients and
patients with cancer. Follow up in patients with significant
comorbidities as clinically warranted. For lung cancer screening,
adhere to Lung-RADS guidelines. Reference: Radiology. [46];
284(1):228-43.
3. Stable scarring and masslike architectural distortion within the
right apex.
4. Diffuse bronchial wall thickening with emphysema, as above;
imaging findings suggestive of underlying COPD.
5. Stable thoracic compression fractures.
6. Aortic Atherosclerosis ([46]-[46]) and Emphysema ([46]-[46]).

## 2021-07-26 ENCOUNTER — Other Ambulatory Visit: Payer: Self-pay

## 2021-07-26 ENCOUNTER — Ambulatory Visit (INDEPENDENT_AMBULATORY_CARE_PROVIDER_SITE_OTHER): Payer: Medicare Other | Admitting: Internal Medicine

## 2021-07-26 ENCOUNTER — Telehealth: Payer: Self-pay | Admitting: Internal Medicine

## 2021-07-26 ENCOUNTER — Encounter: Payer: Medicare Other | Admitting: *Deleted

## 2021-07-26 ENCOUNTER — Encounter: Payer: Self-pay | Admitting: Internal Medicine

## 2021-07-26 VITALS — BP 132/78 | HR 107 | Temp 98.1°F | Ht 72.0 in | Wt 185.6 lb

## 2021-07-26 DIAGNOSIS — J449 Chronic obstructive pulmonary disease, unspecified: Secondary | ICD-10-CM | POA: Diagnosis not present

## 2021-07-26 DIAGNOSIS — J9611 Chronic respiratory failure with hypoxia: Secondary | ICD-10-CM

## 2021-07-26 DIAGNOSIS — R911 Solitary pulmonary nodule: Secondary | ICD-10-CM

## 2021-07-26 DIAGNOSIS — Z006 Encounter for examination for normal comparison and control in clinical research program: Secondary | ICD-10-CM

## 2021-07-26 DIAGNOSIS — J441 Chronic obstructive pulmonary disease with (acute) exacerbation: Secondary | ICD-10-CM

## 2021-07-26 DIAGNOSIS — Z86711 Personal history of pulmonary embolism: Secondary | ICD-10-CM

## 2021-07-26 LAB — D-DIMER, QUANTITATIVE: D-Dimer, Quant: 0.35 mcg/mL FEU (ref <0.50)

## 2021-07-26 MED ORDER — CEFDINIR 300 MG PO CAPS: 300.0000 mg | ORAL_CAPSULE | Freq: Two times a day (BID) | ORAL | 0 refills | Status: DC

## 2021-07-26 MED ORDER — AZITHROMYCIN 250 MG PO TABS: ORAL_TABLET | ORAL | 0 refills | Status: DC

## 2021-07-26 MED ORDER — PREDNISONE 10 MG PO TABS: ORAL_TABLET | ORAL | 3 refills | Status: AC

## 2021-07-26 NOTE — Progress Notes (Signed)
ROV 12/30/16 -- patient has a history of tobacco use, COPD currently maintained on chronic prednisone, Symbicort, Spiriva. Most recent blurry function testing was March 2011 with an FEV1 of 1.2 L (34% Pred), severe obstruction. He up titrates his prednisone on his own based on symptoms. No abx since last time. He has increased pred temporrily about 4 -5 x since last time. No increases for over a month. He is smoking a pipe, not cigarettes. He is using HCTZ more frequently these days. He is on flonase and singulair. He does note that he has some slow progression of his DOE. He coughs a few times a day, clears clear sputum.   rov 07/17/17 --this is a follow-up visit for patient with active tobacco use, severe COPD and chronic prednisone use.  He often titrates his prednisone on his own depending on how he is feeling. He complains of a new pain in his back below his left shoulder blade, has been present for the last 10 days. sometimes pleuritic, can be stabbing, worse w cough. His pred use: on 10mg  for the last 19 days. No hx VTE. He has LE edema, venous stasis changes, increased since his diuretics decreased.   ROV 09/23/17 --76 year old man with a history of tobacco use (still smokes pipe), severe COPD and severe obstruction on spirometry.  At his last visit he had some pleuritic left back pain and I performed a CT PA that I have reviewed from 07/17/17.  There was no pulmonary embolism but extensive emphysema present, some bilateral apical scar more so on the right, stable.  He uses prednisone 10 mg daily and uptitrate depending on his day-to-day symptoms.  He is otherwise managed on Symbicort and Spiriva.  Desaturated on arrival today after ambulating to the exam room. He reports more dyspnea, has increased pred intermittently.    OV 12/05/2017  Chief Complaint  Patient presents with   Follow-up    Switching from RB to MR.   Pt is on 3L with exertion and 2L at rest. Pt states his breathing had  become worse but since he has been on O2 since 3/21, it has really helped with his breathing.  Pt was in hospital 4/28-5/3. Other than SOB, pt does have c/o cough, rattling in chest. Denies any CP/chest tightness.   History from patient, his wife and review of the old chart  Talmadge Coventry IIIs a transfer of care from [name] to myself [name].  His son is my patient and therefore he is done this transfer of care.  According to the patient he is to be seen by [name] for COPD.  Patient is FEV1 34% based on March 2011 PFTs according to chart review.  At baseline he is maintained on Spiriva, Symbicort, Singulair, chronic daily prednisone 10 mg/day for at least 3 years, and 3 times a week of azithromycin for 2 years.  Earlier this year in March 2019 he went on daytime oxygen and following a recent hospitalization April-May 2019.  For syncope that was believed due to nocturnal hypoxemia he was started on night oxygen.  Since starting oxygen his edema has improved and his overall quality of life is improved and he stopped having any orthostasis or presyncopal episodes.  He is grateful for the fact that he is on oxygen.  His current COPD CAT score and severity of symptoms is rated below and is 26.  Show significant amount of symptom burden.  His main goal is to improve  his quality of life.  He had his wife have many questions centering around quality of life, medication therapy.  Of note he has had some thoracic vertebral fractures that are new.  This happened after the fall in late April 2019.  Discovered at admission last month.  He feels is due to prednisone.  He is wondering about portable oxygen.   OV 01/29/2018  Chief Complaint  Patient presents with   Follow-up    Pt states he has been doing okay since last visit. States breathing is about the same, has an occ cough but states the O2 has helped out a lot with breathing and cough. Denies any complaints of CP.   Lesia Sago II , 76 y.o. ,  with dob [birth year] and male ,Not Hispanic or Latino from [place] [id number] - presents to lung clinic for advanced COPD follow-up. Since his last visit his symptoms course of improvement. Score is 26 and severe symptoms of document below. He is on Spiriva, Symbicort, continues oxygen, daily prednisone and azithromycin 3 times a week. He has stopped his singulair without any problems. Overall he says he is better. He is willing to give TRELEGY inhaler trial. Last visit to check blood gas and he does not have hypercapnia so he does not qualify for BiPAP        OV 04/03/2018  Subjective:  Patient ID: Marcelyn Bruins, male , DOB: 1946/06/24 , age 76 y.o. , MRN: [id number] , ADDRESS: [place] [address]   04/03/2018 -   Chief Complaint  Patient presents with   Acute Visit    Pt's O2 sats have been dropping into the 70s with little exertion.  Pt took O2 off just to shave 9/26 and after he finished shaving put the O2 back on, walked from the bathroom down to the living room on O2 and sats were at 77% on 3L 9/26. Pt also has c/o cough with white to yellow mucus and chest tightness with the SOB.     HPI KALYAN BARABAS II 76 y.o. -acute visit for this patient.  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  He is chronically prednisone dependent.  He also is on schedule azithromycin.  He called in yesterday feeling unwell therefore we asked him to come in today.  He tells me that approximately a week ago he started having increased cough and congestion.  Yesterday primary care physician following a routine visit thought he was in COPD exacerbation and started him on doxycycline.  He also personally bumped up his baseline prednisone.  He does me that yesterday while he was shaving on room air he felt more short of breath than usual.  Also when he walked he desaturated more than usual therefore he decided to call in and come in today.   There is no fever or chills or hemoptysis or colored sputum.  No leg edema.  No orthopnea.  He does not feel like he needs to be in the emergency department or get admitted.   OV 05/11/2018  Subjective:  Patient ID: Marcelyn Bruins, male , DOB: 1946-02-12 , age 62 y.o. , MRN: [id number] , ADDRESS: [place] [id number]   05/11/2018 -   Chief Complaint  Patient presents with   Follow-up    pt states breathing has wrosen since last OV. increased sob, occ chest tightness, prod cough with yellow mucus &  chest congestion. currently taking trelegy samples. recent admission 04/07/18 for PE.     HPI Lesia Sago II 76 y.o. -advanced COPD with chronic hypoxemic respiratory failure [not a BiPAP candidate because of lack of hypercapnia] presents with his wife for follow-up.  After the last visit he continued to have symptoms so on April 07, 2018 he got an outpatient CT scan and he had pulmonary embolism.  He got admitted to the hospital and then discharged and since then has been on Xarelto.  He continues his baseline 2-3 L of nasal cannula oxygen but he says since then he is more symptomatic.  Is also corresponds with him starting Trelegy inhaler and also for the last few to several weeks is having increased cough and congestion and yellow phlegm.  His COPD CAT score is deteriorated to 35 and above.  He is very frustrated by his symptoms.  Review of his medication shows that he takes azithromycin and chronic prednisone and Trelegy and oxygen for his COPD.  He is on Xarelto for his blood clots.  He is also on Fosamax for prednisone dependent osteoporosis management.  His smoking is in remission.  Wife says he is always sedentary.  Apparently they tried to do some physical therapy a month ago but he desaturated.  He does not seem to keen on physical therapy right now     OV 06/10/2018  Subjective:  Patient ID: Marcelyn Bruins, male , DOB: 1945/09/29 , age 27 y.o. , MRN: [id number] ,  ADDRESS: [place] [address]   06/10/2018 -   Chief Complaint  Patient presents with   Follow-up    follows for COPD. patient has increased SOB with exertion     HPI Lesia Sago II 76 y.o. -returns for gold stage IV COPD follow-up with chronic hypoxemic respiratory failure.  I saw him approximately a month ago at which time recommended Trelegy.  However the Trelegy is not working well for him.  He feels Spiriva and Symbicort is of benefit for him.  Also recommended he start himself on Daliresp.  Given the GI side effect profile I communicated with his primary care physician about stopping Fosamax.  His primary care physician has advised Reclast infusion for him and is considering this but has not yet switched over.  This because the infusions will have to happen at [facility] long.  However he never started his Daliresp because he read the side effect profile and was worried about back pain.  He wanted me to go over the side effect profile of Daliresp all over again.  I printed up-to-date and went over all the side effects.  I did this with him and his wife.  There is a 3% incidence of backache.  The dominant feature is GI issues of weight loss and diarrhea and nausea and abdominal pain.  The second dominant feature is some anxiety issues.  After reading all this he has decided to start [place].  He is aware of the limitations as well.  He is aware that this is purely preventive in reducing COPD exacerbation frequency.  He understands that the burden on his health from frequent COPD exacerbations is high.  Currently COPD CAT score is back at baseline        OV 07/22/2018  Subjective:  Patient ID: Marcelyn Bruins, male , DOB: [birth year] , age 14 y.o. , MRN: [id number] , ADDRESS: [place] [address]   07/22/2018 -  Chief Complaint  Patient presents with   Follow-up    Pt states it has been rough since last visit. States he has been coughing with  white to yellow phlegm, wheezing, increased SOB which has been going on x2-3 weeks now. Pt denies any real complaints of chest tightness/chest pain.     HPI KEELIN SHERIDAN II 76 y.o. -presents for follow-up of his advanced COPD with chronic hypoxemic respiratory failure.  At last visit we introduce Daliresp as is an effort to prevent COPD flareups.  After some trepidation he started taking it.  He is currently on full dose 5 mcg daily and is tolerating it well.  However he does not think it is reduced the frequency of flareups but again it is too soon to tell.  Approximately over a week ago after some sick contact exposure he had symptoms and signs of COPD exacerbation.  He was given I suspect Levaquin but this caused some confusion.  After that he was changed to doxycycline.  Today's his last of 7 days with doxycycline he is better.  COPD CAT score is improved to 26 but he still is coughing quite a bit and has mucus and feels that he is still not fully back to baseline.  He had a chest x-ray July 13, 2018 that reports this is clear.  He continues on oxygen Spiriva and Symbicort.  There are no other new issues.  No chest pain edema hemoptysis or weight loss.    OV 03/05/2019  Subjective:  Patient ID: Marcelyn Bruins, male , DOB: 22-Nov-1945 , age 43 y.o. , MRN: [id number] , ADDRESS: [place] [address]   03/05/2019 -   Chief Complaint  Patient presents with   COPD with acute exacerbation    Feels it is a little worse since June. Has cough with congestion, was white in color. But this morning it has a yellow and green color to it.     HPI CLAYSON RILING II 76 y.o. -returns for his advanced COPD follow-up.  He says that since his last visit he has been doing overall fair and stable.  Uses 3 L of oxygen, Daliresp, Spiriva, Symbicort.  He has been avoiding risk activities for COVID-19.  He says in the last few weeks he has had increased congestion he does not think this is  COVID-19.  Today he has green-yellow sputum.  He is asking about taking a flu shot.    OV 05/07/2019  Subjective:  Patient ID: Marcelyn Bruins, male , DOB: Nov 17, 1945 , age 68 y.o. , MRN: [id number] , ADDRESS: [place] [address]   05/07/2019 -   Chief Complaint  Patient presents with   Follow-up    Pt states his breathing is at his baseline. Pt c/o prod cough with clear mucus - baseline for pt. Pt denies CP/tightness and f/c/s.    Advance COPD with chronic hypoxemic respiratory failure.  Baseline functional status ECOG 3-4  HPI Lesia Sago II 76 y.o. -presents for COPD follow-up.  He continues to be stable using oxygen.  His current COPD CAT score is 30 which is his baseline.  He recently had a vascular surgery.  He is on oxygen, prednisone, Daliresp, Spiriva and Symbicort.  He is gone back to taking azithromycin for prophylaxis m again COPD exacerbation.        OV 11/11/2019  Subjective:  Patient ID: Marcelyn Bruins, male , DOB: 16-Jul-1945 ,  age 40 y.o. , MRN: [id number] , ADDRESS: [place] [address]   11/11/2019 -   Chief Complaint  Patient presents with   Follow-up    Pt states his breathing is progressively becoming worse and states he feels like he may be in a flare with the COPD. Pt states he is coughing up yellow-green phlegm and also has complaints of wheezing.   Advance COPD patient with recurrent COPD exacerbations.  HPI VETO MACQUEEN II 76 y.o. -presents for follow-up.  He says last month he saw [name] nurse practitioner.  He says he was given a different antibiotic that really helped.  Review of the records indicate one time it was Augmentin another time it was [place].  He believes the second 1 really helped.  He feels in another exacerbation.  He is having green sputum.  He says with the flutter valve and exacerbation a lot of sputum comes out but between exacerbations is very minimal.  He is frustrated by his  recurrent exacerbations.  He did not tolerate Trelegy in the past.  He is on Spiriva, Symbicort, oxygen 3 L, flutter valve, Flonase, Roflumilast and 3 times a week of azithromycin.  He is looking to see if he can improve his quality of life.  His COPD CAT score is 34.  His last CT scan of the chest was in 2019.     CAT Score 11/11/2019 03/05/2019 04/03/2018  Total CAT Score 32 30 30     OV 11/26/2019 - telephone visit. 2PHI identified. Risks, benefit, limitation explained  Subjective:  Patient ID: Marcelyn Bruins, male , DOB: January 09, 1946 , age 42 y.o. , MRN: [id number] , ADDRESS: [place] [address] Advanced COPD with recurrent exacerbation now colonized by pseudomonas  11/26/2019 -    HPI DAINE GUNTHER II 76 y.o. -last seen by myself Nov 11, 2019.  At that time we decided to do a work-up for recurrent exacerbations.  His immunoglobulin levels are normal.  His sputum grew Pseudomonas that is pansensitive.  He CT scan of the chest shows right upper lobe worsening fibrotic pattern.  There is no mass per se.  I personally visualized the CT chest.  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  Today in the visit he tells me after 3 days he had nausea and then he had joint pain on the third day and quit taking it.  He still continues to be symptomatic.     ROS - per HPI  CT Chest Wo Contrast  Result Date: 11/25/2019 CLINICAL DATA:  COPD exacerbation. Severe shortness of breath. EXAM: CT CHEST WITHOUT CONTRAST TECHNIQUE: Multidetector CT imaging of the chest was performed following the standard protocol without IV contrast. COMPARISON:  04/07/2018. FINDINGS: Cardiovascular: The heart size is normal. There is no pericardial effusion. Aortic atherosclerosis. Three vessel coronary artery atherosclerotic calcifications identified Mediastinum/Nodes: No enlarged mediastinal or axillary  lymph nodes. Thyroid gland, trachea, and esophagus demonstrate no significant findings. Lungs/Pleura: No pleural effusion identified. Advanced changes of centrilobular and paraseptal emphysema with mild bullous changes. Progressive confluent fibrotic with extensive architectural distortion and volume loss no acute airspace consolidation, atelectasis or pneumothorax. Scattered, bilateral areas of pleuroparenchymal scarring identified changes within the apical portions of the right upper lobe are again noted. Upper Abdomen: Bilateral low-attenuation adrenal nodules appears similar to previous exam and are compatible with benign  adenomas. No acute abnormality identified within the imaged portions of the upper abdomen. Musculoskeletal: Mild scoliosis and degenerative disc disease noted within the thoracic spine. Stable chronic compression deformities at T6, T7 and T8. No new compression fractures. IMPRESSION: 1. No acute cardiopulmonary abnormalities. 2. Progressive confluent fibrotic changes within the right upper lobe with extensive architectural distortion and volume loss. Likely the sequelae of prior inflammation/infection. 3. Aortic atherosclerosis, in addition to 3 vessel coronary artery disease. Please note that although the presence of coronary artery calcium documents the presence of coronary artery disease, the severity of this disease and any potential stenosis cannot be assessed on this non-gated CT examination. Assessment for potential risk factor modification, dietary therapy or pharmacologic therapy may be warranted, if clinically indicated. 4. Bilateral adrenal adenomas. 5. Stable chronic compression deformities at T6, T7 and T8. 6. Diffuse bronchial wall thickening with emphysema, as above; imaging findings suggestive of underlying COPD. Aortic Atherosclerosis (ICD10-I70.0) and Emphysema (ICD10-J43.9). Electronically Signed   By: Kerby Moors M.D.   On: 11/25/2019 12:21     OV  05/03/2020  Subjective:  Patient ID: Marcelyn Bruins, male , DOB: [birth year] , age 40 y.o. , MRN: [id number] , ADDRESS: [place] [address] PCP [name], MD Patient Care Team: [name], MD as PCP - General (Family Medicine) Brand Males, MD as Consulting Physician (Pulmonary Disease) Danella Sensing, MD as Consulting Physician (Dermatology) Dr. Henreitta Leber, DDS (Dentistry)  This Provider for this visit: Treatment Team:  Attending Provider: Brand Males, MD    05/03/2020 -   Chief Complaint  Patient presents with   Follow-up    doing best he has in a year and a half.     ICD-10-CM   1. Stage 4 very severe COPD by GOLD classification ([facility])  J44.9   2. Chronic respiratory failure with hypoxia (HCC)  J96.11   3. COPD, frequent exacerbations ([facility])  J44.1   4. Abnormal findings on diagnostic imaging of lung  R91.8   5. Pulmonary embolism, unspecified chronicity, unspecified pulmonary embolism type, unspecified whether acute cor pulmonale present (HCC)  I26.99   6. Vaccine counseling  Z71.85      HPI MOHAMAD BRUSO II 76 y.o. -presents with his wife for the above issues.  He said his Covid booster has had his flu shot.  He has follow-up coming end of November with primary care physician.  He wants me to send a note to the primary care physician.  He is without any exacerbations.  He continues prednisone, azithromycin, Roflumilast, Spiriva and Symbicort.  He does not want to do triple inhaler therapies.  He is on anticoagulation.  He says after the GI bleeding issues resolved he is better.  He had a CT scan of the chest that is documented below.  His infiltrates are improving but there is mucus/debris in the main airway he is denying any aspiration.  He does have hiatal hernia.  Apparently this was discovered during endoscopy.  CAT Score 05/03/2020 11/11/2019 03/05/2019 04/03/2018  Total CAT Score 16 32 30 30     IMPRESSION: 1. Trace right  pleural effusion and associated atelectasis or consolidation, both of which are improved compared to prior examination. Findings are generally consistent with resolving sequelae of prior infection or aspiration. 2. Diffuse bilateral bronchial wall thickening consistent with nonspecific infectious or inflammatory bronchitis. 3. Frothy debris in the bilateral mainstem bronchi, concerning for aspiration. 4. Severe centrilobular emphysema. Emphysema (ICD10-J43.9). 5. Nonspecific fibrotic  scarring of the right greater than left lung apices, likely sequelae of prior infection or inflammation. 6. Coronary artery disease.  Aortic Atherosclerosis (ICD10-I70.0).     Electronically Signed   By: Eddie Candle M.D.   On: 04/26/2020 13:42   ROS - per HPI  OV 08/31/2020  Subjective:  Patient ID: Marcelyn Bruins, male , DOB: 01-Oct-1945 , age 32 y.o. , MRN: [id number] , ADDRESS: [place] [address] PCP [name], MD Patient Care Team: [name], MD as PCP - General (Family Medicine) Brand Males, MD as Consulting Physician (Pulmonary Disease) Danella Sensing, MD as Consulting Physician (Dermatology) Dr. Henreitta Leber, DDS (Dentistry)  This Provider for this visit: Treatment Team:  Attending Provider: Brand Males, MD    08/31/2020 -   Chief Complaint  Patient presents with   Follow-up    More congestion at the moment, more SOB   Advanced end-stage COPD on oxygen and ECOG 3-4 History of pulmonary embolism on anticoagulation  HPI Lesia Sago II 76 y.o. -presents for follow-up.  Presents with his wife he sitting on wheelchair.  He says for the last few weeks he is having a flareup with increased urine production that is green.  He wants antibiotics.  He says in December 2021 I switched his Omnicef to ciprofloxacin based on Pseudomonas and sensitivity but he actually felt the [place] worked better.  However he said that the duration again was too  short and he actually wants Omnicef again for 14 days at this time.  He also thinks a prednisone taper/burst would help him.  Otherwise he continues on Spiriva Symbicort Daliresp 3 times weekly azithromycin and daily prednisone 10 mg.  Is also on oxygen.  He is on anticoagulation for his pulmonary embolism.  He fell down and broke his back but did not have any bleeding.  He is going to have an MRI.  Noted that he is not on any Mucinex and is going to start this after my advice.  We discussed about switching his Breo and Symbicort to a triple inhaler combination either Trelegy or BREZTRI but he says both of not working he does not want to do it.   OV 04/26/2021  Subjective:  Patient ID: Marcelyn Bruins, male , DOB: May 07, 1946 , age 58 y.o. , MRN: [id number] , ADDRESS: [address] [place] [address] PCP [name], MD Patient Care Team: [name], MD as PCP - General (Family Medicine) Brand Males, MD as Consulting Physician (Pulmonary Disease) Danella Sensing, MD as Consulting Physician (Dermatology) Dr. Henreitta Leber, DDS (Dentistry)  This Provider for this visit: Treatment Team:  Attending Provider: Brand Males, MD    04/26/2021 -   Chief Complaint  Patient presents with   Follow-up    Pt states he is about the same since last visit. Has problems with coughing and will get up occ white to yellow phlegm and also has had some wheezing.    Advanced end-stage COPD on oxygen and ECOG 3-4 - last CT Oct 2021 History of pulmonary embolism on anticoagulation - June 2021 Iron def anemia - follows Dr Julien Nordmann  HPI Lesia Sago II 76 y.o. -returns for follow-up with his wife.  His ECOG continues to be 3-4.  He is wheelchair and minimal activities.  He is on oxygen, Spiriva, Symbicort, azithromycin for preventing COPD exacerbations, Daliresp, daily prednisone.  Is not using 3% saline nebulizer.  Instead he says flutter valve  works well for him.  Last seen in  February 2022 and since then no exacerbations.  The longest time he has had without exacerbation he is happy about it.  He has some waxing and waning sputum production.  Few days ago it was yellow but now clear.  He is going to monitor this.  He is up-to-date with his COVID vaccines.  He is no longer following with Dr. Julien Nordmann for his iron deficiency anemia.  He is on anticoagulation for his PE in June 2021.    CT Chest data  No results found.    PFT  No flowsheet data found.  OV 07/26/2021  Subjective:  Patient ID: Marcelyn Bruins, male , DOB: [birth year] , age 76 y.o. , MRN: [id number] , ADDRESS: [address] [place] [address] PCP [name], MD Patient Care Team: [name], MD as PCP - General (Family Medicine) Brand Males, MD as Consulting Physician (Pulmonary Disease) Danella Sensing, MD as Consulting Physician (Dermatology) Dr. Henreitta Leber, DDS (Dentistry)  This Provider for this visit: Treatment Team:  Attending Provider: Brand Males, MD    07/26/2021 -   Chief Complaint  Patient presents with   Follow-up    Pt is here to discuss results of recent CT.    Advanced end-stage COPD on oxygen and ECOG 3-4 - last CT Oct 2021 History of pulmonary embolism on anticoagulation - June 2021 Iron def anemia - follows Dr Julien Nordmann Right lower lobe superior segment nodule - 9 m -January 2023 new onset  HPI BELA NYBORG II 76 y.o. -follows for his COPD.  Presents with his wife.  He continues on his oxygen at 4 L.  He feels he is beginning to have a COPD exacerbation.  He did have a CT scan of the chest that was routine.  It shows a 9 mm right lower lobe superior segment nodule.  I looked at it it looks solid and lobulated.  I think there is a fair probability greater than 65% this is malignancy.  He is asymptomatic from it.  He just wants preemptive antibiotics and prednisone burst for his incipient COPD exacerbation.  In terms of his pulmonary  embolism from 2 and half years ago he continues his Eliquis.  The Eliquis at low-dose for prophylaxis.  His D-dimer in the past has been normal.  But we decided to continue the low-dose Eliquis.  We will check his D-dimer again.    CAT Score 04/26/2021 08/31/2020 05/03/2020 11/11/2019  Total CAT Score 31 23 16  32        CT chest 07/10/21  Narrative & Impression  CLINICAL DATA:  COPD exacerbation. Respiratory failure with hypoxia.   EXAM: CT CHEST WITHOUT CONTRAST   TECHNIQUE: Multidetector CT imaging of the chest was performed following the standard protocol without IV contrast.   COMPARISON:  04/25/2020   FINDINGS: Cardiovascular: The heart size is within normal limits. No pericardial effusion. Aortic atherosclerosis and coronary artery calcifications.   Mediastinum/Nodes: No enlarged mediastinal or axillary lymph nodes. Thyroid gland, trachea, and esophagus demonstrate no significant findings.   Lungs/Pleura: Severe changes of centrilobular and paraseptal emphysema. Diffuse bronchial wall thickening. No pleural effusion or acute airspace consolidation. No interstitial edema. Scarring and masslike architectural distortion within the right apex appears unchanged from the previous exam. Within the superior segment of right lower lobe there is a lung nodule measuring 1.1 x 0.6 cm (mean diameter 9 mm), image 82/3. New compared with the previous exam.  Upper Abdomen: No acute findings within the imaged portions of the upper abdomen. Aortic atherosclerosis.   Musculoskeletal: Curvature of the thoracic spine is convex towards the right. Compression fractures are again noted involving T6, T7 and T8. Unchanged from previous exam.   IMPRESSION: 1. No acute cardiopulmonary abnormalities. 2. There is a new pulmonary nodule within the superior segment of right lower lobe with a mean diameter of 9 mm. Consider a non-contrast Chest CT at 3 months, a PET/CT, or tissue  sampling. These guidelines do not apply to immunocompromised patients and patients with cancer. Follow up in patients with significant comorbidities as clinically warranted. For lung cancer screening, adhere to Lung-RADS guidelines. Reference: Radiology. 2017; 284(1):228-43. 3. Stable scarring and masslike architectural distortion within the right apex. 4. Diffuse bronchial wall thickening with emphysema, as above; imaging findings suggestive of underlying COPD. 5. Stable thoracic compression fractures. 6. Aortic Atherosclerosis (ICD10-I70.0) and Emphysema (ICD10-J43.9).     Electronically Signed   By: Kerby Moors M.D.   On: 07/10/2021 13:33       has a past medical history of Chronic back pain, Chronic rhinitis, Compressed spine fracture ([place]), COPD (chronic obstructive pulmonary disease) ([place]), Emphysema lung ([place]), On home oxygen therapy, Onychomycosis, Orthostatic hypotension, PAD (peripheral artery disease) ([place]), Pneumonia, Pulmonary embolism ([place]) (04/07/2018), Skin cancer, and Vertigo.   reports that he quit smoking about 3 years ago. His smoking use included pipe and cigarettes. He started smoking about 55 years ago. He has a 104.00 pack-year smoking history. He has never used smokeless tobacco.  Past Surgical History:  Procedure Laterality Date   ABDOMINAL AORTOGRAM W/LOWER EXTREMITY N/A 01/21/2020   Procedure: ABDOMINAL AORTOGRAM W/LOWER EXTREMITY;  Surgeon: Elam Dutch, MD;  Location: [facility];  Service: Cardiovascular;  Laterality: N/A;   APPENDECTOMY     BIOPSY  02/20/2020   Procedure: BIOPSY;  Surgeon: Rush Landmark Telford Nab., MD;  Location: [facility];  Service: Gastroenterology;;   CATARACT EXTRACTION, BILATERAL     ENDARTERECTOMY FEMORAL Right 04/19/2019   Procedure: ENDARTERECTOMY RIGHT COMMON FEMORAL;  Surgeon: Elam Dutch, MD;  Location: [facility];  Service: Vascular;  Laterality: Right;   ENDARTERECTOMY FEMORAL Left 01/24/2020   Procedure:  LEFT FEMORAL ENDARTERECTOMY WITH DACRON PATCH ANGIOPLASTY;  Surgeon: Elam Dutch, MD;  [facility];  Service: Vascular;  Laterality: Left;   ESOPHAGOGASTRODUODENOSCOPY (EGD) WITH PROPOFOL N/A 02/20/2020   Procedure: ESOPHAGOGASTRODUODENOSCOPY (EGD) WITH PROPOFOL;  Surgeon: Irving Copas., MD;  Location: [facility];  Service: Gastroenterology;  Laterality: N/A;   FEMORAL ENDARTERECTOMY Left 01/24/2020   HEMOSTASIS CLIP PLACEMENT  02/20/2020   Procedure: HEMOSTASIS CLIP PLACEMENT;  Surgeon: Irving Copas., MD;  Location: [facility];  Service: Gastroenterology;;   HOT HEMOSTASIS N/A 02/20/2020   Procedure: HOT HEMOSTASIS (ARGON PLASMA COAGULATION/BICAP);  Surgeon: Irving Copas., MD;  Location: [facility];  Service: Gastroenterology;  Laterality: N/A;   INSERTION OF ILIAC STENT Left 01/24/2020   Procedure: INSERTION OF VBX STENT 8X59 AND 8X39 LEFT COMMON ILIAC ARTERY. INSERTION OF INNOVA 7 X 60 INNOVA STENT LEFT EXTERNAL ILIAC ARTERY. ;  Surgeon: Elam Dutch, MD;  Location: [facility];  Service: Vascular;  Laterality: Left;   LOWER EXTREMITY ANGIOGRAPHY  12/11/2018   LOWER EXTREMITY ANGIOGRAPHY N/A 12/11/2018   Procedure: LOWER EXTREMITY ANGIOGRAPHY;  Surgeon: Elam Dutch, MD;  Location: [facility];  Service: Cardiovascular;  Laterality: N/A;   PATCH ANGIOPLASTY Right 04/19/2019   Procedure: Patch Angioplasty;  Surgeon: Elam Dutch,  MD;  Location: [facility];  Service: Vascular;  Laterality: Right;   PERIPHERAL VASCULAR INTERVENTION Right 12/11/2018   Procedure: PERIPHERAL VASCULAR INTERVENTION;  Surgeon: Elam Dutch, MD;  Location: [facility];  Service: Cardiovascular;  Laterality: Right;  Common Iliac    SKIN CANCER EXCISION     "lips, face, ears, arms" (04/07/2018)   TONSILLECTOMY     ULTRASOUND GUIDANCE FOR VASCULAR ACCESS Right 01/24/2020   Procedure: ULTRASOUND GUIDANCE FOR VASCULAR ACCESS;  Surgeon: Elam Dutch, MD;   Location: [facility];  Service: Vascular;  Laterality: Right;    Allergies  Allergen Reactions   Tape Other (See Comments)    SKIN IS VERY THIN, TEARS SKIN; CAN ONLY USE COBAN WRAPS DUE TO CONDITION OF SKIN!!   Ciprofloxacin Nausea Only    Sick on stomach, weak/tired   Levaquin [Levofloxacin] Other (See Comments)    hallucinations   Lipitor [Atorvastatin] Rash    Immunization History  Administered Date(s) Administered   Fluad Quad(high Dose 65+) 03/16/2019, 03/10/2020   Influenza Split 04/24/2012   Influenza Whole 05/08/2009, 04/08/2011   Influenza, High Dose Seasonal PF 05/01/2017, 03/31/2018, 04/17/2021   Influenza,inj,Quad PF,6+ Mos 04/27/2013, 04/15/2014, 04/24/2015   Influenza,inj,quad, With Preservative 05/01/2018   Influenza-Unspecified 05/13/2016   PFIZER Comirnaty(Gray Top)Covid-19 Tri-Sucrose Vaccine 11/24/2020   PFIZER(Purple Top)SARS-COV-2 Vaccination 07/30/2019, 08/20/2019, 04/07/2020   Pfizer Covid-19 Vaccine Bivalent Booster 21yrs & up 03/16/2021   Pneumococcal Conjugate-13 02/25/2014   Pneumococcal Polysaccharide-23 08/15/2011, 12/19/2016   Pneumococcal-Unspecified 03/08/2017   Td 02/21/2010   Tdap 11/05/2017   Zoster, Live 01/29/2012    Family History  Problem Relation Age of Onset   Heart disease Mother        CABG in her 86s, nonsmoker   Cancer Mother    Stroke Father    Heart disease Father        Died of MI at age 63, smoker   Hepatitis Sister    Coronary artery disease Other        male 1st degree relative <60   Colon cancer Neg Hx    Pancreatic cancer Neg Hx    Esophageal cancer Neg Hx    Inflammatory bowel disease Neg Hx    Liver disease Neg Hx    Rectal cancer Neg Hx    Stomach cancer Neg Hx      Current Outpatient Medications:    acetaminophen (TYLENOL) 500 MG tablet, Take 1,000 mg by mouth every 6 (six) hours as needed for moderate pain. , Disp: , Rfl:    albuterol (VENTOLIN HFA) 108 (90 Base) MCG/ACT inhaler, USE 2 PUFFS EVERY 6 HOURS   AS NEEDED FOR WHEEZING, Disp: 18 g, Rfl: 3   azithromycin (ZITHROMAX) 250 MG tablet, TAKE 1 TABLET BY MOUTH  EVERY MONDAY, WEDNESDAY,  AND FRIDAY, Disp: 24 tablet, Rfl: 1   Calcium Carbonate-Vitamin D (CALCIUM 600+D PO), Take 2 tablets by mouth daily., Disp: , Rfl:    cefdinir (OMNICEF) 300 MG capsule, Take 1 capsule (300 mg total) by mouth 2 (two) times daily., Disp: 10 capsule, Rfl: 0   cycloSPORINE (RESTASIS) 0.05 % ophthalmic emulsion, Place 1 drop into both eyes 2 (two) times daily as needed (dry eyes). , Disp: , Rfl:    ELIQUIS 2.5 MG TABS tablet, TAKE 1 TABLET BY MOUTH  TWICE DAILY, Disp: 180 tablet, Rfl: 3   Ferrous Sulfate (IRON) 325 (65 Fe) MG TABS, Take 1 tablet by mouth daily., Disp: , Rfl:    fluticasone (FLONASE) 50 MCG/ACT  nasal spray, Place 2 sprays into both nostrils daily., Disp: 48 g, Rfl: 3   folic acid (FOLVITE) 1 MG tablet, Take 1 tablet (1 mg total) by mouth daily., Disp: , Rfl:    furosemide (LASIX) 20 MG tablet, TAKE 1 TABLET (20 MG TOTAL) BY MOUTH DAILY AS NEEDED FOR FLUID OR EDEMA., Disp: 90 tablet, Rfl: 3   Multiple Vitamin (MULTIVITAMIN WITH MINERALS) TABS tablet, Take 1 tablet by mouth daily., Disp: , Rfl:    mupirocin cream (BACTROBAN) 2 %, Apply 1 application topically 2 (two) times daily., Disp: 22 g, Rfl: 0   OXYGEN, Inhale 4-5 L into the lungs continuous. , Disp: , Rfl:    pantoprazole (PROTONIX) 20 MG tablet, Take 1 tablet (20 mg total) by mouth daily., Disp: 90 tablet, Rfl: 1   predniSONE (DELTASONE) 10 MG tablet, Take 4 tablets (40 mg total) by mouth daily with breakfast for 2 days, THEN 2 tablets (20 mg total) daily with breakfast for 2 days. Then continue on 10mg  daily.., Disp: 90 tablet, Rfl: 3   Respiratory Therapy Supplies (FLUTTER) DEVI, 10 times Twice a day and prn as needed, may increase if feeling worse, Disp: 1 each, Rfl: 0   roflumilast (DALIRESP) 500 MCG TABS tablet, Take 1 tablet (500 mcg total) by mouth daily., Disp: 90 tablet, Rfl: 0    rosuvastatin (CRESTOR) 10 MG tablet, TAKE 1 TABLET BY MOUTH  DAILY, Disp: 90 tablet, Rfl: 3   sodium chloride HYPERTONIC 3 % nebulizer solution, Take by nebulization daily., Disp: 90 mL, Rfl: 12   SYMBICORT 160-4.5 MCG/ACT inhaler, USE 2 INHALATIONS BY MOUTH  TWICE DAILY, Disp: 30.6 g, Rfl: 3   Tiotropium Bromide Monohydrate (SPIRIVA RESPIMAT) 2.5 MCG/ACT AERS, USE 2 INHALATIONS BY MOUTH  DAILY, Disp: 12 g, Rfl: 3   traMADol (ULTRAM) 50 MG tablet, Take 1 tablet (50 mg total) by mouth every 12 (twelve) hours as needed for moderate pain or severe pain., Disp: 90 tablet, Rfl: 1      Objective:   Vitals:   07/26/21 1202  BP: 132/78  Pulse: (!) 107  Temp: 98.1 F (36.7 C)  TempSrc: Oral  SpO2: 95%  Weight: 185 lb 9.6 oz (84.2 kg)  Height: 6' (1.829 m)    Estimated body mass index is 25.17 kg/m as calculated from the following:   Height as of this encounter: 6' (1.829 m).   Weight as of this encounter: 185 lb 9.6 oz (84.2 kg).  @WEIGHTCHANGE @  [employer]   07/26/21 1202  Weight: 185 lb 9.6 oz (84.2 kg)     Physical Exam    General: No distress.  Deconditioned cushingoid male sitting in the wheelchair oxygen on Neuro: Alert and Oriented x 3. GCS 15. Speech normal Psych: Pleasant Resp:  Barrel Chest - yes.  Wheeze - no, Crackles - no, No overt respiratory distress CVS: Normal heart sounds. Murmurs - no Ext: Stigmata of Connective Tissue Disease - no.  Has chronic venous stasis edema with skin thickening HEENT: Normal upper airway. PEERL +. No post nasal drip        Assessment:       ICD-10-CM   1. Nodule of lower lobe of right lung  R91.1 NM PET Image Initial (PI) Skull Base To Thigh    D-Dimer, Quantitative    D-Dimer, Quantitative    2. COPD with acute exacerbation (HCC)  J44.1 NM PET Image Initial (PI) Skull Base To Thigh    D-Dimer, Quantitative    D-Dimer, Quantitative  3. COPD, frequent exacerbations ([place])  J44.1 CT Chest Wo Contrast    4. Stage 4  very severe COPD by GOLD classification ([place])  J44.9     5. Chronic respiratory failure with hypoxia (HCC)  J96.11     6. Hx pulmonary embolism  Z86.711 CT Chest Wo Contrast         Plan:     Patient Instructions  Nodule of lower lobe of right lung - 74mm new Jul 10, 2021  Plan  - meet [facility] rsearch team to join [employer] nodule study  - do PET scan in 2-3 weeks  - after PET scan meet Dr Valeta Harms in 2-4 weeks - if odds of cancer high and bronchoscopy considered risky, you might be recommended empiric radiation  COPD with acute exacerbation (HCC) COPD, frequent exacerbations ([place])  - evolving mild flare up  plan - omnicef 300mg  twice daily  5 days  - Take prednisone 40 mg daily x 2 days, then 20mg  daily x 2 days, then 10mg  daily x continue baseline dose - continue zpak and roflumilast per routine except hold z pak while taking omnicef  Stage 4 very severe COPD by GOLD classification ([place]) Chronic respiratory failure with hypoxia ([place])  Plan  --Continue oxygen, Spiriva, Symbicort, Daliresp and 3 times weekly azithromycin and daily prednisone otherwise   History Pulmonary embolism -   -normal d-dimer 04/26/2021  Plan  - recheck d-dimer 07/26/2021  - continue low dose eliquis for now - do CT chest without contrast in 3 months   Follow-up -3 months with dR Jonny Longino - 3 wees with Dr Valeta Harms after PET scan    ( Level 05 visit: Estb 40-54 min  in  visit type: on-site physical face to visit  in total care time and counseling or/and coordination of care by this undersigned MD - Dr Brand Males. This includes one or more of the following on this same day 07/26/2021: pre-charting, chart review, note writing, documentation discussion of test results, diagnostic or treatment recommendations, prognosis, risks and benefits of management options, instructions, education, compliance or risk-factor reduction. It excludes time spent by the [facility] or office staff in the care of the  patient. Actual time 40 min)   SIGNATURE    Dr. Brand Males, M.D., F.C.C.P,  Pulmonary and Critical Care Medicine Staff Physician, [place] Director - Interstitial Lung Disease  Program  [facility] [place], [place], [address]  Pager: [phone], If no answer or between  15:00h - 7:00h: call [phone] Telephone: [phone]  5:31 PM 07/26/2021

## 2021-07-26 NOTE — Patient Instructions (Addendum)
Nodule of lower lobe of right lung - 83mm new Jul 10, 2021  Plan  - meet [facility] rsearch team to join veracyte nodule study  - do PET scan in 2-3 weeks  - after PET scan meet Dr Valeta Harms in 2-4 weeks - if odds of cancer high and bronchoscopy considered risky, you might be recommended empiric radiation  COPD with acute exacerbation (HCC) COPD, frequent exacerbations ([place])  - evolving mild flare up  plan - omnicef 300mg  twice daily  5 days  - Take prednisone 40 mg daily x 2 days, then 20mg  daily x 2 days, then 10mg  daily x continue baseline dose - continue zpak and roflumilast per routine except hold z pak while taking omnicef  Stage 4 very severe COPD by GOLD classification ([place]) Chronic respiratory failure with hypoxia ([place])  Plan  --Continue oxygen, Spiriva, Symbicort, Daliresp and 3 times weekly azithromycin and daily prednisone otherwise   History Pulmonary embolism -   -normal d-dimer 04/26/2021  Plan  - recheck d-dimer 07/26/2021  - continue low dose eliquis for now - do CT chest without contrast in 3 months   Follow-up -3 months with dR Temperance Kelemen - 3 wees with Dr Valeta Harms after PET scan

## 2021-07-26 NOTE — Research (Signed)
Title: [place]: CliNIcal Utility of ManaGement of Patients witH CT and LDCT Identified Pulmonary Nodules UsinG the Percepta NasAL Swab ClassifiEr -- with Familiarization   Protocol #: [id number] Sponsor: [employer]   Protocol Revision 1 dated 01Sep2022 and confirmed current on today's visit, IRB approved Revision 1 on 29Dec2022.   Objectives:  Primary: To evaluate if use of the Percepta Nasal Swab test in the diagnostic work up of newly identified pulmonary nodules reduces the number of invasive procedures in the group classified as low-risk by the test and that are benign as compared to a control group managed without a Percepta Nasal Swab test result.                   A newly identified nodule is defined as any nodule first identified on imaging                   <90 days prior to nasal sample collection that hasnt undergone a diagnostic                    procedure for the management of their index nodule prior to enrollment.                   CT imaging includes conventional CT, LDCT, HRCT                   Benign diagnosis is defined as a specific diagnosis of a benign condition,                    radiographic resolution or stability at ? 24 months, or no cytological,                    radiological, or pathological evidence of cancer.                   Procedures will be categorized as either invasive or non-invasive in the Data                    Management Plan (DMP). Secondary: To evaluate if use of the Percepta Nasal Swab test in the diagnostic work up of newly identified pulmonary nodules increases the proportion of subjects classified as high-risk by the test and have primary lung cancer that go directly to appropriate therapy as compared to a control group managed without a Percepta Nasal Swab test result.                    Proportion of subjects that go directly to appropriate therapy is defined as those                     subjects that undergo surgery, ablative  or other appropriate therapy as the next                     step after the Percepta Nasal Swab test result without intervening non-surgical                     procedures                             a. Non-surgical procedures include diagnostic PET, but not PET for  staging purposes.                             b. Appropriate therapies will be defined in the CRF.                    A newly identified nodule is defined as any nodule first identified on imaging                    <90 days prior to nasal sample collection.                    Lung cancer diagnosis is defined as established by cytology or pathology, or in                     circumstances where a presumptive diagnosis of cancer led to definitive                     ablative or other appropriate therapy without pathology. Key Inclusion Criteria:  Inclusion Criteria:  Able to tolerate nasal epithelial specimen collection  Signed written Informed Consent obtained  Subject clinical history available for review by sponsor and regulatory agencies  New nodule first identified on imaging < 90 days prior to nasal sample collection (index nodule)  CT report available for index nodule  54 - 90 years of age  Current or former smoker (>100 cigarettes in a lifetime)  Pulmonary nodule ?30 mm detected by CT  Key Exclusion Criteria: Exclusion Criteria  Subject has undergone a diagnostic procedure for the management of their index nodule after the index CT and prior to enrollment  Active cancer (other than non-melanoma skin cancer)  Prior primary lung cancer (prior non-lung cancer acceptable)  Prior participation in this study (i.e., subjects may not be enrolled more than once)  Current active treatment with an investigational device or drug (patients in trial follow up period are okay if intervention phase is complete)  Patient enrolled or planned to be enrolled in another clinical trial that may influence  management of the patients nodule  Concurrent or planned use of tools or tests for assigning lung nodule risk of malignancy (e.g., genomic or proteomic blood tests) other than clinically validated risk calculators  Clinical Research Coordinator / Research RN note : This visit is for enrollment /baseline Subject [id number] with DOB: [id number] on [id number] for the above protocol is an Enrollment Visit and is for purpose of research.    Subject expressed interest and consent in continuing as a study subject. Subject confirmed contact information (e.g. address, telephone, email). Subject thanked for participation in research and contribution to science.     During this visit on 19Jan2023  , the subject reviewed and signed the consent form, provided demographics, and had a nasal swab collected per the above referenced protocol. Please refer to the subject's paper source binder for further details.   Signed by Jaye Beagle CRNII, RN, MSN/MBA

## 2021-07-26 NOTE — Progress Notes (Addendum)
Sub-investigator ESTATION  I the sub Investigator (PI) for the above mentioned study attest that I reviewed the mentioned  clinical research coordinator  notes on research subject  Francisco [name]  born Oct 06, 1945 . I  agree with the findings mentioned above  Dr.Shadiamond Koska L Brigett Estell, DO Pulmonary and Critical Care Medicine Research Investigator & Staff Physician [facility] Pager: [phone], If no answer or between  15:00h - 7:00h: call [phone]  07/26/2021 2:18 PM

## 2021-07-26 NOTE — Telephone Encounter (Signed)
° °  D-dmer normal  Plan for now continue low-dose Eliquis for clot prevention.  Given his sedentary lifestyle and now the lung nodule he has had some risk for clot recurrence.  As long as there is no bleeding he can continue the low-dose Eliquis.  However if he wants to switch to baby aspirin he could

## 2021-07-27 NOTE — Telephone Encounter (Signed)
Called and spoke with pt letting him know the results of the bloodwork and recommendations per MR. Pt said he will continue to take the Eliquis. Nothing further needed.

## 2021-08-07 ENCOUNTER — Telehealth: Payer: Self-pay | Admitting: Internal Medicine

## 2021-08-08 ENCOUNTER — Ambulatory Visit (HOSPITAL_COMMUNITY)
Admission: RE | Admit: 2021-08-08 | Discharge: 2021-08-08 | Disposition: A | Discharge status: Home / Self Care | Payer: Medicare Other | Source: Ambulatory Visit | Attending: Internal Medicine | Admitting: Internal Medicine

## 2021-08-08 ENCOUNTER — Other Ambulatory Visit: Payer: Self-pay

## 2021-08-08 DIAGNOSIS — R911 Solitary pulmonary nodule: Secondary | ICD-10-CM | POA: Insufficient documentation

## 2021-08-08 DIAGNOSIS — J441 Chronic obstructive pulmonary disease with (acute) exacerbation: Secondary | ICD-10-CM | POA: Diagnosis present

## 2021-08-08 LAB — GLUCOSE, CAPILLARY: Glucose-Capillary: 102 mg/dL — ABNORMAL HIGH (ref 70–99)

## 2021-08-08 IMAGING — PT NM PET TUM IMG INITIAL (PI) SKULL BASE T - THIGH
1 of 7 series · 1 of 25 positions shown · non-contrast
Comparison: None.

CLINICAL DATA: Initial treatment strategy for pulmonary nodule.
75-year-old male with COPD.

EXAM:
NUCLEAR MEDICINE PET SKULL BASE TO THIGH
TECHNIQUE: 9.2 mCi F-18 FDG was injected intravenously. Full-ring PET imaging
was performed from the skull base to thigh after the radiotracer. CT
data was obtained and used for attenuation correction and anatomic
localization.
Fasting blood glucose: 102 mg/dl

[Series 3: pet sk_thigh ac · axial · 5.0mm · 4.07mm/px · 1 of 217 slices shown]
[im 109/217]
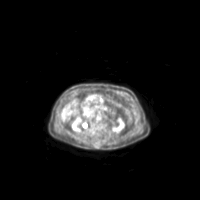

[1 of 25 positions shown; findings below may reference images not displayed]

FINDINGS: Mediastinal blood pool activity: SUV max

Liver activity: SUV max NA

NECK: No hypermetabolic lymph nodes in the neck.

Incidental CT findings: none

CHEST: Within the superior aspect of the RIGHT lower lobe 13 mm
ovoid nodule has intense metabolic activity SUV max equal 7.7 (image
64).

No additional hypermetabolic nodules within LEFT or RIGHT lung.

Severe centrilobular emphysema in the upper lobes. Scarring in the
RIGHT upper lobe without metabolic activity.

No hypermetabolic mediastinal or hilar lymph nodes.

Incidental CT findings: Coronary artery calcification and aortic
atherosclerotic calcification.

ABDOMEN/PELVIS: No abnormal hypermetabolic activity within the
liver, pancreas, adrenal glands, or spleen. No hypermetabolic lymph
nodes in the abdomen or pelvis.

Incidental CT findings: Atherosclerotic calcification of the aorta.
Bilateral fat filled inguinal hernias. A short loop of descending
colon enters the LEFT inguinal hernia.

SKELETON: No focal hypermetabolic activity to suggest skeletal
metastasis.

Incidental CT findings: none
IMPRESSION: 1. Solitary hypermetabolic pulmonary nodule in the RIGHT lower lobe
with intense metabolic activity is most consistent PRIMARY
BRONCHOGENIC CARCINOMA.
2. No mediastinal adenopathy, supraclavicular adenopathy or distant
metastatic disease. No skeletal metastasis.

Incidental CT findings:

1. Centrilobular emphysema in the upper lobes.
2. Coronary artery calcification and Aortic Atherosclerosis
([CE]-[CE]).
3. LEFT inguinal hernia contains a short loop of nonobstructed
descending colon.

## 2021-08-08 MED ORDER — FLUDEOXYGLUCOSE F - 18 (FDG) INJECTION
9.2000 | Freq: Once | INTRAVENOUS | Status: AC | PRN
  Administered 2021-08-08: 9.2 via INTRAVENOUS

## 2021-08-08 NOTE — Telephone Encounter (Signed)
Spoke with [facility] and they stated as long pt provided own O2 no orders were needed. Pt does have his own POC but was worried about battery running out. Pt was advised to bring charger with him to procedure. Pt stated understanding. Nothing further needed at this time.

## 2021-08-09 ENCOUNTER — Encounter: Payer: Self-pay | Admitting: Pulmonary Disease

## 2021-08-09 ENCOUNTER — Ambulatory Visit (INDEPENDENT_AMBULATORY_CARE_PROVIDER_SITE_OTHER): Payer: Medicare Other | Admitting: Pulmonary Disease

## 2021-08-09 VITALS — BP 130/64 | HR 110 | Temp 98.4°F | Ht 72.0 in | Wt 185.0 lb

## 2021-08-09 DIAGNOSIS — R942 Abnormal results of pulmonary function studies: Secondary | ICD-10-CM | POA: Diagnosis not present

## 2021-08-09 DIAGNOSIS — R911 Solitary pulmonary nodule: Secondary | ICD-10-CM

## 2021-08-09 DIAGNOSIS — J441 Chronic obstructive pulmonary disease with (acute) exacerbation: Secondary | ICD-10-CM

## 2021-08-09 MED ORDER — AMOXICILLIN-POT CLAVULANATE 875-125 MG PO TABS: 1.0000 | ORAL_TABLET | Freq: Two times a day (BID) | ORAL | 0 refills | Status: AC

## 2021-08-09 MED ORDER — PREDNISONE 10 MG PO TABS: ORAL_TABLET | ORAL | 0 refills | Status: DC

## 2021-08-09 NOTE — H&P (View-Only) (Signed)
Synopsis: Referred in  February 2023 lung nodule, primary pulmonology [name], PCP: by [name], MD  Subjective:   PATIENT ID: Francisco [name] GENDER: male DOB: 09-08-1945, MRN: [id number]  Chief Complaint  Patient presents with   Follow-up    Patient is here to go over PET results.    This is a 76 year old gentleman, past medical history of vascular disease, advanced COPD on home oxygen therapy, 3 L continuous.  Advanced emphysema on CT imaging found to have a right-sided lung nodule.  Sent for PET scan imaging which revealed hypermetabolic uptake.  I have reviewed images today in the office with the patient we talked about next best steps.  Patient is a former smoker.  He has been put to sleep before.  Last time was a year ago and he tolerated EGD.  He is also had vascular work in the legs by [name].  Patient is on Eliquis.   Past Medical History:  Diagnosis Date   Chronic back pain    "mid and lower" (04/07/2018)   Chronic rhinitis    -Sinus Ct 08/01/2009 >> Bilateral maxillary sinusitis with some mucosal thickeningin the sphenoid and frontal sinuses as well with air fluid levels present -chronic rhinitis flyer Aug 04, 2009   Compressed spine fracture [facility])    COPD (chronic obstructive pulmonary disease) (HCC)    PFT's rec Jul 17, 2009   Emphysema lung [facility])    On home oxygen therapy    "3L; 24/7" (04/07/2018)   Onychomycosis    Dr. Judi Cong   Orthostatic hypotension    "since 10/2017" (04/07/2018)   PAD (peripheral artery disease) ([place])    Pneumonia    "twice in 1 year" (04/07/2018)   Pulmonary embolism ([place]) 04/07/2018   Skin cancer    "lips, face, ears, arms" (04/07/2018)   Vertigo    "since ~ 02/2018" (04/07/2018)     Family History  Problem Relation Age of Onset   Heart disease Mother        CABG in her 28s, nonsmoker   Cancer Mother    Stroke Father    Heart disease Father        Died of MI at age 44, smoker   Hepatitis Sister     Coronary artery disease Other        male 1st degree relative <60   Colon cancer Neg Hx    Pancreatic cancer Neg Hx    Esophageal cancer Neg Hx    Inflammatory bowel disease Neg Hx    Liver disease Neg Hx    Rectal cancer Neg Hx    Stomach cancer Neg Hx      Past Surgical History:  Procedure Laterality Date   ABDOMINAL AORTOGRAM W/LOWER EXTREMITY N/A 01/21/2020   Procedure: ABDOMINAL AORTOGRAM W/LOWER EXTREMITY;  Surgeon: Elam Dutch, MD;  Location: [place] [facility];  Service: Cardiovascular;  Laterality: N/A;   APPENDECTOMY     BIOPSY  02/20/2020   Procedure: BIOPSY;  Surgeon: Rush Landmark Telford Nab., MD;  Location: [place];  Service: Gastroenterology;;   CATARACT EXTRACTION, BILATERAL     ENDARTERECTOMY FEMORAL Right 04/19/2019   Procedure: ENDARTERECTOMY RIGHT COMMON FEMORAL;  Surgeon: Elam Dutch, MD;  Location: [place];  Service: Vascular;  Laterality: Right;   ENDARTERECTOMY FEMORAL Left 01/24/2020   Procedure: LEFT FEMORAL ENDARTERECTOMY WITH DACRON PATCH ANGIOPLASTY;  Surgeon: Elam Dutch, MD;  Location: [place];  Service: Vascular;  Laterality: Left;   ESOPHAGOGASTRODUODENOSCOPY (EGD)  WITH PROPOFOL N/A 02/20/2020   Procedure: ESOPHAGOGASTRODUODENOSCOPY (EGD) WITH PROPOFOL;  Surgeon: Rush Landmark Telford Nab., MD;  Location: [place];  Service: Gastroenterology;  Laterality: N/A;   FEMORAL ENDARTERECTOMY Left 01/24/2020   HEMOSTASIS CLIP PLACEMENT  02/20/2020   Procedure: HEMOSTASIS CLIP PLACEMENT;  Surgeon: Irving Copas., MD;  Location: [place];  Service: Gastroenterology;;   HOT HEMOSTASIS N/A 02/20/2020   Procedure: HOT HEMOSTASIS (ARGON PLASMA COAGULATION/BICAP);  Surgeon: Irving Copas., MD;  Location: [place];  Service: Gastroenterology;  Laterality: N/A;   INSERTION OF ILIAC STENT Left 01/24/2020   Procedure: INSERTION OF VBX STENT 8X59 AND 8X39 LEFT COMMON ILIAC ARTERY. INSERTION OF INNOVA 7 X 60  INNOVA STENT LEFT EXTERNAL ILIAC ARTERY. ;  Surgeon: Elam Dutch, MD;  Location: [place];  Service: Vascular;  Laterality: Left;   LOWER EXTREMITY ANGIOGRAPHY  12/11/2018   LOWER EXTREMITY ANGIOGRAPHY N/A 12/11/2018   Procedure: LOWER EXTREMITY ANGIOGRAPHY;  Surgeon: Elam Dutch, MD;  Location: [place] [facility];  Service: Cardiovascular;  Laterality: N/A;   PATCH ANGIOPLASTY Right 04/19/2019   Procedure: Patch Angioplasty;  Surgeon: Elam Dutch, MD;  Location: [facility] OR;  Service: Vascular;  Laterality: Right;   PERIPHERAL VASCULAR INTERVENTION Right 12/11/2018   Procedure: PERIPHERAL VASCULAR INTERVENTION;  Surgeon: Elam Dutch, MD;  Location: [place] [facility];  Service: Cardiovascular;  Laterality: Right;  Common Iliac    SKIN CANCER EXCISION     "lips, face, ears, arms" (04/07/2018)   TONSILLECTOMY     ULTRASOUND GUIDANCE FOR VASCULAR ACCESS Right 01/24/2020   Procedure: ULTRASOUND GUIDANCE FOR VASCULAR ACCESS;  Surgeon: Elam Dutch, MD;  Location: [facility] OR;  Service: Vascular;  Laterality: Right;    Social History   Socioeconomic History   Marital status: Married    Spouse name: Not on file   Number of children: 2   Years of education: 3 years of college   Highest education level: Not on file  Occupational History    Employer: [employer]  Tobacco Use   Smoking status: Former    Packs/day: 2.00    Years: 52.00    Pack years: 104.00    Types: Pipe, Cigarettes    Start date: 10/06/1965    Quit date: 10/06/2017    Years since quitting: 3.8   Smokeless tobacco: Never   Tobacco comments:    04/07/2018 "smoked cigarettes years ago; stopped ~ 20 yr ago; stopped smoking pipe in 09/2017"  Vaping Use   Vaping Use: Never used  Substance and Sexual Activity   Alcohol use: Yes    Alcohol/week: 42.0 standard drinks    Types: 42 Cans of beer per week   Drug use: No   Sexual activity: Not Currently  Other Topics Concern   Not on file  Social History  Narrative   Married (wife Katy Apo patient of Dr. Yong Channel) with children (2-son and daughter). 1 grandson.       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       Hobbies: fishing, projects at home   Social Determinants of Radio broadcast assistant Strain: Not on file  Food Insecurity: Not on file  Transportation Needs: Not on file  Physical Activity: Not on file  Stress: Not on file  Social Connections: Not on file  Intimate Partner Violence: Not on file     Allergies  Allergen Reactions   Tape Other (See Comments)    SKIN IS VERY THIN,  TEARS SKIN; CAN ONLY USE COBAN WRAPS DUE TO CONDITION OF SKIN!!   Ciprofloxacin Nausea Only    Sick on stomach, weak/tired   Levaquin [Levofloxacin] Other (See Comments)    hallucinations   Lipitor [Atorvastatin] Rash     Outpatient Medications Prior to Visit  Medication Sig Dispense Refill   acetaminophen (TYLENOL) 500 MG tablet Take 1,000 mg by mouth every 6 (six) hours as needed for moderate pain.      albuterol (VENTOLIN HFA) 108 (90 Base) MCG/ACT inhaler USE 2 PUFFS EVERY 6 HOURS  AS NEEDED FOR WHEEZING 18 g 3   azithromycin (ZITHROMAX) 250 MG tablet TAKE 1 TABLET BY MOUTH  EVERY MONDAY, WEDNESDAY,  AND FRIDAY 24 tablet 1   Calcium Carbonate-Vitamin D (CALCIUM 600+D PO) Take 2 tablets by mouth daily.     cefdinir (OMNICEF) 300 MG capsule Take 1 capsule (300 mg total) by mouth 2 (two) times daily. 10 capsule 0   cycloSPORINE (RESTASIS) 0.05 % ophthalmic emulsion Place 1 drop into both eyes 2 (two) times daily as needed (dry eyes).      ELIQUIS 2.5 MG TABS tablet TAKE 1 TABLET BY MOUTH  TWICE DAILY 180 tablet 3   Ferrous Sulfate (IRON) 325 (65 Fe) MG TABS Take 1 tablet by mouth daily.     fluticasone (FLONASE) 50 MCG/ACT nasal spray Place 2 sprays into both nostrils daily. 48 g 3   folic acid (FOLVITE) 1 MG tablet Take 1 tablet (1 mg total) by mouth daily.     furosemide (LASIX) 20 MG tablet TAKE  1 TABLET (20 MG TOTAL) BY MOUTH DAILY AS NEEDED FOR FLUID OR EDEMA. 90 tablet 3   Multiple Vitamin (MULTIVITAMIN WITH MINERALS) TABS tablet Take 1 tablet by mouth daily.     mupirocin cream (BACTROBAN) 2 % Apply 1 application topically 2 (two) times daily. 22 g 0   OXYGEN Inhale 4-5 L into the lungs continuous.      pantoprazole (PROTONIX) 20 MG tablet Take 1 tablet (20 mg total) by mouth daily. 90 tablet 1   Respiratory Therapy Supplies (FLUTTER) DEVI 10 times Twice a day and prn as needed, may increase if feeling worse 1 each 0   roflumilast (DALIRESP) 500 MCG TABS tablet Take 1 tablet (500 mcg total) by mouth daily. 90 tablet 0   rosuvastatin (CRESTOR) 10 MG tablet TAKE 1 TABLET BY MOUTH  DAILY 90 tablet 3   sodium chloride HYPERTONIC 3 % nebulizer solution Take by nebulization daily. 90 mL 12   SYMBICORT 160-4.5 MCG/ACT inhaler USE 2 INHALATIONS BY MOUTH  TWICE DAILY 30.6 g 3   Tiotropium Bromide Monohydrate (SPIRIVA RESPIMAT) 2.5 MCG/ACT AERS USE 2 INHALATIONS BY MOUTH  DAILY 12 g 3   traMADol (ULTRAM) 50 MG tablet Take 1 tablet (50 mg total) by mouth every 12 (twelve) hours as needed for moderate pain or severe pain. 90 tablet 1   No facility-administered medications prior to visit.    Review of Systems  Constitutional:  Negative for chills, fever, malaise/fatigue and weight loss.  HENT:  Negative for hearing loss, sore throat and tinnitus.   Eyes:  Negative for blurred vision and double vision.  Respiratory:  Positive for cough and shortness of breath. Negative for hemoptysis, sputum production, wheezing and stridor.   Cardiovascular:  Negative for chest pain, palpitations, orthopnea, leg swelling and PND.  Gastrointestinal:  Negative for abdominal pain, constipation, diarrhea, heartburn, nausea and vomiting.  Genitourinary:  Negative for dysuria, hematuria and urgency.  Musculoskeletal:  Negative for joint pain and myalgias.  Skin:  Negative for itching and rash.   Neurological:  Negative for dizziness, tingling, weakness and headaches.  Endo/Heme/Allergies:  Negative for environmental allergies. Does not bruise/bleed easily.  Psychiatric/Behavioral:  Negative for depression. The patient is not nervous/anxious and does not have insomnia.   All other systems reviewed and are negative.   Objective:  Physical Exam Vitals reviewed.  Constitutional:      General: He is not in acute distress.    Appearance: He is well-developed.  HENT:     Head: Normocephalic and atraumatic.     Mouth/Throat:     Pharynx: No oropharyngeal exudate.  Eyes:     Conjunctiva/sclera: Conjunctivae normal.     Pupils: Pupils are equal, round, and reactive to light.  Neck:     Vascular: No JVD.     Trachea: No tracheal deviation.     Comments: Loss of supraclavicular fat Cardiovascular:     Rate and Rhythm: Normal rate and regular rhythm.     Heart sounds: S1 normal and S2 normal.     Comments: Distant heart tones Pulmonary:     Effort: No tachypnea or accessory muscle usage.     Breath sounds: No stridor. Decreased breath sounds (throughout all lung fields), wheezing and rhonchi present. No rales.  Abdominal:     General: Bowel sounds are normal. There is no distension.     Palpations: Abdomen is soft.     Tenderness: There is no abdominal tenderness.  Musculoskeletal:        General: Deformity (muscle wasting ) present.  Skin:    General: Skin is warm and dry.     Capillary Refill: Capillary refill takes less than 2 seconds.     Findings: No rash.  Neurological:     Mental Status: He is alert and oriented to person, place, and time.  Psychiatric:        Behavior: Behavior normal.     Vitals:   08/09/21 0927  BP: 130/64  Pulse: (!) 110  Temp: 98.4 F (36.9 C)  TempSrc: Oral  SpO2: 96%  Weight: 185 lb (83.9 kg)  Height: 6' (1.829 m)   96% on 3 LPM  BMI Readings from Last 3 Encounters:  08/09/21 25.09 kg/m  07/26/21 25.17 kg/m  04/26/21 23.73  kg/m   Wt Readings from Last 3 Encounters:  08/09/21 185 lb (83.9 kg)  07/26/21 185 lb 9.6 oz (84.2 kg)  04/26/21 175 lb (79.4 kg)     CBC    Component Value Date/Time   WBC 10.9 (H) 12/07/2020 0848   RBC 3.98 (L) 12/07/2020 0848   HGB 13.3 12/07/2020 0848   HGB 13.5 10/04/2020 1453   HCT 39.3 12/07/2020 0848   PLT 265.0 12/07/2020 0848   PLT 206 10/04/2020 1453   MCV 98.9 12/07/2020 0848   MCH 31.5 10/04/2020 1453   MCHC 33.7 12/07/2020 0848   RDW 14.2 12/07/2020 0848   LYMPHSABS 1.5 12/07/2020 0848   MONOABS 1.2 (H) 12/07/2020 0848   EOSABS 0.2 12/07/2020 0848   BASOSABS 0.1 12/07/2020 0848     Chest Imaging: 08/08/2021: Nuclear medicine pet imaging hypermetabolic uptake within within 10 mm lung nodule. The patient's images have been independently reviewed by me.    Pulmonary Functions Testing Results: No flowsheet data found.  FeNO:   Pathology:   Echocardiogram:   Heart Catheterization:     Assessment & Plan:     ICD-10-CM   1. Lung nodule  R91.1 Procedural/ Surgical Case Request: ROBOTIC ASSISTED NAVIGATIONAL BRONCHOSCOPY    Ambulatory referral to Pulmonology    2. COPD with acute exacerbation ([place])  J44.1     3. Abnormal PET scan, lung  R94.2       Discussion: This is a 76 year old gentleman longstanding history of smoking on max therapy for his advanced emphysema, chronic hypoxemic respiratory failure.  Recent increased cough and sputum production completed course antibiotics but still feels like he has a lot of sputum production.  Plan: Treat his exacerbation to ensure he is at best respiratory status before consideration for bronchoscopy. Will give a round of Augmentin plus prednisone taper. Discussed risk benefits and alternatives of proceeding with bronchoscopy. Patient is agreeable to proceed. We talked about the risk of bleeding and pneumothorax. He is on anticoagulation his last dose should be on the February 25 prior to  procedure. Tentative bronchoscopy date on February 28. We appreciate the PCC's help with scheduling.   Current Outpatient Medications:    acetaminophen (TYLENOL) 500 MG tablet, Take 1,000 mg by mouth every 6 (six) hours as needed for moderate pain. , Disp: , Rfl:    albuterol (VENTOLIN HFA) 108 (90 Base) MCG/ACT inhaler, USE 2 PUFFS EVERY 6 HOURS  AS NEEDED FOR WHEEZING, Disp: 18 g, Rfl: 3   amoxicillin-clavulanate (AUGMENTIN) 875-125 MG tablet, Take 1 tablet by mouth 2 (two) times daily for 14 days., Disp: 28 tablet, Rfl: 0   azithromycin (ZITHROMAX) 250 MG tablet, TAKE 1 TABLET BY MOUTH  EVERY MONDAY, WEDNESDAY,  AND FRIDAY, Disp: 24 tablet, Rfl: 1   Calcium Carbonate-Vitamin D (CALCIUM 600+D PO), Take 2 tablets by mouth daily., Disp: , Rfl:    cefdinir (OMNICEF) 300 MG capsule, Take 1 capsule (300 mg total) by mouth 2 (two) times daily., Disp: 10 capsule, Rfl: 0   cycloSPORINE (RESTASIS) 0.05 % ophthalmic emulsion, Place 1 drop into both eyes 2 (two) times daily as needed (dry eyes). , Disp: , Rfl:    ELIQUIS 2.5 MG TABS tablet, TAKE 1 TABLET BY MOUTH  TWICE DAILY, Disp: 180 tablet, Rfl: 3   Ferrous Sulfate (IRON) 325 (65 Fe) MG TABS, Take 1 tablet by mouth daily., Disp: , Rfl:    fluticasone (FLONASE) 50 MCG/ACT nasal spray, Place 2 sprays into both nostrils daily., Disp: 48 g, Rfl: 3   folic acid (FOLVITE) 1 MG tablet, Take 1 tablet (1 mg total) by mouth daily., Disp: , Rfl:    furosemide (LASIX) 20 MG tablet, TAKE 1 TABLET (20 MG TOTAL) BY MOUTH DAILY AS NEEDED FOR FLUID OR EDEMA., Disp: 90 tablet, Rfl: 3   Multiple Vitamin (MULTIVITAMIN WITH MINERALS) TABS tablet, Take 1 tablet by mouth daily., Disp: , Rfl:    mupirocin cream (BACTROBAN) 2 %, Apply 1 application topically 2 (two) times daily., Disp: 22 g, Rfl: 0   OXYGEN, Inhale 4-5 L into the lungs continuous. , Disp: , Rfl:    pantoprazole (PROTONIX) 20 MG tablet, Take 1 tablet (20 mg total) by mouth daily., Disp: 90  tablet, Rfl: 1   predniSONE (DELTASONE) 10 MG tablet, Take 4 tabs by mouth once daily x4 days, then 3 tabs x4 days, 2 tabs x4 days, 1 tab x4 days and stop., Disp: 40 tablet, Rfl: 0   Respiratory Therapy Supplies (FLUTTER) DEVI, 10 times Twice a day and prn as needed, may increase if feeling worse, Disp: 1 each, Rfl: 0   roflumilast (DALIRESP) 500 MCG TABS tablet, Take 1 tablet (500 mcg total)  by mouth daily., Disp: 90 tablet, Rfl: 0   rosuvastatin (CRESTOR) 10 MG tablet, TAKE 1 TABLET BY MOUTH  DAILY, Disp: 90 tablet, Rfl: 3   sodium chloride HYPERTONIC 3 % nebulizer solution, Take by nebulization daily., Disp: 90 mL, Rfl: 12   SYMBICORT 160-4.5 MCG/ACT inhaler, USE 2 INHALATIONS BY MOUTH  TWICE DAILY, Disp: 30.6 g, Rfl: 3   Tiotropium Bromide Monohydrate (SPIRIVA RESPIMAT) 2.5 MCG/ACT AERS, USE 2 INHALATIONS BY MOUTH  DAILY, Disp: 12 g, Rfl: 3   traMADol (ULTRAM) 50 MG tablet, Take 1 tablet (50 mg total) by mouth every 12 (twelve) hours as needed for moderate pain or severe pain., Disp: 90 tablet, Rfl: 1  I spent 63 minutes dedicated to the care of this patient on the date of this encounter to include pre-visit review of records, face-to-face time with the patient discussing conditions above, post visit ordering of testing, clinical documentation with the electronic health record, making appropriate referrals as documented, and communicating necessary findings to members of the patients care team.   Garner Nash, DO [facility] 08/09/2021 10:03 AM

## 2021-08-09 NOTE — Patient Instructions (Addendum)
Thank you for visiting Dr. Valeta Harms at [facility]. Today we recommend the following: Orders Placed This Encounter  Procedures   Procedural/ Surgical Case Request: ROBOTIC ASSISTED NAVIGATIONAL BRONCHOSCOPY   Ambulatory referral to Pulmonology   Bronchoscopy on 09/04/2021  Return in about 5 weeks (around 09/11/2021) for with Eric Form, NP.    Please do your part to reduce the spread of COVID-19.

## 2021-08-09 NOTE — Progress Notes (Signed)
Synopsis: Referred in  February 2023 lung nodule, primary pulmonology [name], PCP: by [name], MD  Subjective:   PATIENT ID: Francisco [name] GENDER: male DOB: [birth year], MRN: [id number]  Chief Complaint  Patient presents with   Follow-up    Patient is here to go over PET results.    This is a 76 year old gentleman, past medical history of vascular disease, advanced COPD on home oxygen therapy, 3 L continuous.  Advanced emphysema on CT imaging found to have a right-sided lung nodule.  Sent for PET scan imaging which revealed hypermetabolic uptake.  I have reviewed images today in the office with the patient we talked about next best steps.  Patient is a former smoker.  He has been put to sleep before.  Last time was a year ago and he tolerated EGD.  He is also had vascular work in the legs by [name].  Patient is on Eliquis.   Past Medical History:  Diagnosis Date   Chronic back pain    "mid and lower" (04/07/2018)   Chronic rhinitis    -Sinus Ct 08/01/2009 >> Bilateral maxillary sinusitis with some mucosal thickeningin the sphenoid and frontal sinuses as well with air fluid levels present -chronic rhinitis flyer Aug 04, 2009   Compressed spine fracture [facility])    COPD (chronic obstructive pulmonary disease) (HCC)    PFT's rec Jul 17, 2009   Emphysema lung [facility])    On home oxygen therapy    "3L; 24/7" (04/07/2018)   Onychomycosis    Dr. Judi Cong   Orthostatic hypotension    "since 10/2017" (04/07/2018)   PAD (peripheral artery disease) ([place])    Pneumonia    "twice in 1 year" (04/07/2018)   Pulmonary embolism ([place]) 04/07/2018   Skin cancer    "lips, face, ears, arms" (04/07/2018)   Vertigo    "since ~ 02/2018" (04/07/2018)     Family History  Problem Relation Age of Onset   Heart disease Mother        CABG in her 44s, nonsmoker   Cancer Mother    Stroke Father    Heart disease Father        Died of MI at age 35, smoker   Hepatitis Sister     Coronary artery disease Other        male 1st degree relative <60   Colon cancer Neg Hx    Pancreatic cancer Neg Hx    Esophageal cancer Neg Hx    Inflammatory bowel disease Neg Hx    Liver disease Neg Hx    Rectal cancer Neg Hx    Stomach cancer Neg Hx      Past Surgical History:  Procedure Laterality Date   ABDOMINAL AORTOGRAM W/LOWER EXTREMITY N/A 01/21/2020   Procedure: ABDOMINAL AORTOGRAM W/LOWER EXTREMITY;  Surgeon: Elam Dutch, MD;  Location: [place] [facility];  Service: Cardiovascular;  Laterality: N/A;   APPENDECTOMY     BIOPSY  02/20/2020   Procedure: BIOPSY;  Surgeon: Rush Landmark Telford Nab., MD;  Location: [place];  Service: Gastroenterology;;   CATARACT EXTRACTION, BILATERAL     ENDARTERECTOMY FEMORAL Right 04/19/2019   Procedure: ENDARTERECTOMY RIGHT COMMON FEMORAL;  Surgeon: Elam Dutch, MD;  Location: [place];  Service: Vascular;  Laterality: Right;   ENDARTERECTOMY FEMORAL Left 01/24/2020   Procedure: LEFT FEMORAL ENDARTERECTOMY WITH DACRON PATCH ANGIOPLASTY;  Surgeon: Elam Dutch, MD;  Location: [place];  Service: Vascular;  Laterality: Left;   ESOPHAGOGASTRODUODENOSCOPY (EGD)  WITH PROPOFOL N/A 02/20/2020   Procedure: ESOPHAGOGASTRODUODENOSCOPY (EGD) WITH PROPOFOL;  Surgeon: Rush Landmark Telford Nab., MD;  Location: [place];  Service: Gastroenterology;  Laterality: N/A;   FEMORAL ENDARTERECTOMY Left 01/24/2020   HEMOSTASIS CLIP PLACEMENT  02/20/2020   Procedure: HEMOSTASIS CLIP PLACEMENT;  Surgeon: Irving Copas., MD;  Location: [place];  Service: Gastroenterology;;   HOT HEMOSTASIS N/A 02/20/2020   Procedure: HOT HEMOSTASIS (ARGON PLASMA COAGULATION/BICAP);  Surgeon: Irving Copas., MD;  Location: [employer];  Service: Gastroenterology;  Laterality: N/A;   INSERTION OF ILIAC STENT Left 01/24/2020   Procedure: INSERTION OF VBX STENT 8X59 AND 8X39 LEFT COMMON ILIAC ARTERY. INSERTION OF INNOVA 7 X 60  INNOVA STENT LEFT EXTERNAL ILIAC ARTERY. ;  Surgeon: Elam Dutch, MD;  Location: [place];  Service: Vascular;  Laterality: Left;   LOWER EXTREMITY ANGIOGRAPHY  12/11/2018   LOWER EXTREMITY ANGIOGRAPHY N/A 12/11/2018   Procedure: LOWER EXTREMITY ANGIOGRAPHY;  Surgeon: Elam Dutch, MD;  Location: [place] [facility];  Service: Cardiovascular;  Laterality: N/A;   PATCH ANGIOPLASTY Right 04/19/2019   Procedure: Patch Angioplasty;  Surgeon: Elam Dutch, MD;  Location: [facility] OR;  Service: Vascular;  Laterality: Right;   PERIPHERAL VASCULAR INTERVENTION Right 12/11/2018   Procedure: PERIPHERAL VASCULAR INTERVENTION;  Surgeon: Elam Dutch, MD;  Location: [place] [facility];  Service: Cardiovascular;  Laterality: Right;  Common Iliac    SKIN CANCER EXCISION     "lips, face, ears, arms" (04/07/2018)   TONSILLECTOMY     ULTRASOUND GUIDANCE FOR VASCULAR ACCESS Right 01/24/2020   Procedure: ULTRASOUND GUIDANCE FOR VASCULAR ACCESS;  Surgeon: Elam Dutch, MD;  Location[facility] OR;  Service: Vascular;  Laterality: Right;    Social History   Socioeconomic History   Marital status: Married    Spouse name: Not on file   Number of children: 2   Years of education: 3 years of college   Highest education level: Not on file  Occupational History    Employer: [employer]  Tobacco Use   Smoking status: Former    Packs/day: 2.00    Years: 52.00    Pack years: 104.00    Types: Pipe, Cigarettes    Start date: 10/06/1965    Quit date: 10/06/2017    Years since quitting: 3.8   Smokeless tobacco: Never   Tobacco comments:    04/07/2018 "smoked cigarettes years ago; stopped ~ 20 yr ago; stopped smoking pipe in 09/2017"  Vaping Use   Vaping Use: Never used  Substance and Sexual Activity   Alcohol use: Yes    Alcohol/week: 42.0 standard drinks    Types: 42 Cans of beer per week   Drug use: No   Sexual activity: Not Currently  Other Topics Concern   Not on file  Social History  Narrative   Married (wife Katy Apo patient of Dr. Yong Channel) with children (2-son and daughter). 1 grandson.       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       Hobbies: fishing, projects at home   Social Determinants of Radio broadcast assistant Strain: Not on file  Food Insecurity: Not on file  Transportation Needs: Not on file  Physical Activity: Not on file  Stress: Not on file  Social Connections: Not on file  Intimate Partner Violence: Not on file     Allergies  Allergen Reactions   Tape Other (See Comments)    SKIN IS VERY THIN,  TEARS SKIN; CAN ONLY USE COBAN WRAPS DUE TO CONDITION OF SKIN!!   Ciprofloxacin Nausea Only    Sick on stomach, weak/tired   Levaquin [Levofloxacin] Other (See Comments)    hallucinations   Lipitor [Atorvastatin] Rash     Outpatient Medications Prior to Visit  Medication Sig Dispense Refill   acetaminophen (TYLENOL) 500 MG tablet Take 1,000 mg by mouth every 6 (six) hours as needed for moderate pain.      albuterol (VENTOLIN HFA) 108 (90 Base) MCG/ACT inhaler USE 2 PUFFS EVERY 6 HOURS  AS NEEDED FOR WHEEZING 18 g 3   azithromycin (ZITHROMAX) 250 MG tablet TAKE 1 TABLET BY MOUTH  EVERY MONDAY, WEDNESDAY,  AND FRIDAY 24 tablet 1   Calcium Carbonate-Vitamin D (CALCIUM 600+D PO) Take 2 tablets by mouth daily.     cefdinir (OMNICEF) 300 MG capsule Take 1 capsule (300 mg total) by mouth 2 (two) times daily. 10 capsule 0   cycloSPORINE (RESTASIS) 0.05 % ophthalmic emulsion Place 1 drop into both eyes 2 (two) times daily as needed (dry eyes).      ELIQUIS 2.5 MG TABS tablet TAKE 1 TABLET BY MOUTH  TWICE DAILY 180 tablet 3   Ferrous Sulfate (IRON) 325 (65 Fe) MG TABS Take 1 tablet by mouth daily.     fluticasone (FLONASE) 50 MCG/ACT nasal spray Place 2 sprays into both nostrils daily. 48 g 3   folic acid (FOLVITE) 1 MG tablet Take 1 tablet (1 mg total) by mouth daily.     furosemide (LASIX) 20 MG tablet TAKE  1 TABLET (20 MG TOTAL) BY MOUTH DAILY AS NEEDED FOR FLUID OR EDEMA. 90 tablet 3   Multiple Vitamin (MULTIVITAMIN WITH MINERALS) TABS tablet Take 1 tablet by mouth daily.     mupirocin cream (BACTROBAN) 2 % Apply 1 application topically 2 (two) times daily. 22 g 0   OXYGEN Inhale 4-5 L into the lungs continuous.      pantoprazole (PROTONIX) 20 MG tablet Take 1 tablet (20 mg total) by mouth daily. 90 tablet 1   Respiratory Therapy Supplies (FLUTTER) DEVI 10 times Twice a day and prn as needed, may increase if feeling worse 1 each 0   roflumilast (DALIRESP) 500 MCG TABS tablet Take 1 tablet (500 mcg total) by mouth daily. 90 tablet 0   rosuvastatin (CRESTOR) 10 MG tablet TAKE 1 TABLET BY MOUTH  DAILY 90 tablet 3   sodium chloride HYPERTONIC 3 % nebulizer solution Take by nebulization daily. 90 mL 12   SYMBICORT 160-4.5 MCG/ACT inhaler USE 2 INHALATIONS BY MOUTH  TWICE DAILY 30.6 g 3   Tiotropium Bromide Monohydrate (SPIRIVA RESPIMAT) 2.5 MCG/ACT AERS USE 2 INHALATIONS BY MOUTH  DAILY 12 g 3   traMADol (ULTRAM) 50 MG tablet Take 1 tablet (50 mg total) by mouth every 12 (twelve) hours as needed for moderate pain or severe pain. 90 tablet 1   No facility-administered medications prior to visit.    Review of Systems  Constitutional:  Negative for chills, fever, malaise/fatigue and weight loss.  HENT:  Negative for hearing loss, sore throat and tinnitus.   Eyes:  Negative for blurred vision and double vision.  Respiratory:  Positive for cough and shortness of breath. Negative for hemoptysis, sputum production, wheezing and stridor.   Cardiovascular:  Negative for chest pain, palpitations, orthopnea, leg swelling and PND.  Gastrointestinal:  Negative for abdominal pain, constipation, diarrhea, heartburn, nausea and vomiting.  Genitourinary:  Negative for dysuria, hematuria and urgency.  Musculoskeletal:  Negative for joint pain and myalgias.  Skin:  Negative for itching and rash.   Neurological:  Negative for dizziness, tingling, weakness and headaches.  Endo/Heme/Allergies:  Negative for environmental allergies. Does not bruise/bleed easily.  Psychiatric/Behavioral:  Negative for depression. The patient is not nervous/anxious and does not have insomnia.   All other systems reviewed and are negative.   Objective:  Physical Exam Vitals reviewed.  Constitutional:      General: He is not in acute distress.    Appearance: He is well-developed.  HENT:     Head: Normocephalic and atraumatic.     Mouth/Throat:     Pharynx: No oropharyngeal exudate.  Eyes:     Conjunctiva/sclera: Conjunctivae normal.     Pupils: Pupils are equal, round, and reactive to light.  Neck:     Vascular: No JVD.     Trachea: No tracheal deviation.     Comments: Loss of supraclavicular fat Cardiovascular:     Rate and Rhythm: Normal rate and regular rhythm.     Heart sounds: S1 normal and S2 normal.     Comments: Distant heart tones Pulmonary:     Effort: No tachypnea or accessory muscle usage.     Breath sounds: No stridor. Decreased breath sounds (throughout all lung fields), wheezing and rhonchi present. No rales.  Abdominal:     General: Bowel sounds are normal. There is no distension.     Palpations: Abdomen is soft.     Tenderness: There is no abdominal tenderness.  Musculoskeletal:        General: Deformity (muscle wasting ) present.  Skin:    General: Skin is warm and dry.     Capillary Refill: Capillary refill takes less than 2 seconds.     Findings: No rash.  Neurological:     Mental Status: He is alert and oriented to person, place, and time.  Psychiatric:        Behavior: Behavior normal.     Vitals:   08/09/21 0927  BP: 130/64  Pulse: (!) 110  Temp: 98.4 F (36.9 C)  TempSrc: Oral  SpO2: 96%  Weight: 185 lb (83.9 kg)  Height: 6' (1.829 m)   96% on 3 LPM  BMI Readings from Last 3 Encounters:  08/09/21 25.09 kg/m  07/26/21 25.17 kg/m  04/26/21 23.73  kg/m   Wt Readings from Last 3 Encounters:  08/09/21 185 lb (83.9 kg)  07/26/21 185 lb 9.6 oz (84.2 kg)  04/26/21 175 lb (79.4 kg)     CBC    Component Value Date/Time   WBC 10.9 (H) 12/07/2020 0848   RBC 3.98 (L) 12/07/2020 0848   HGB 13.3 12/07/2020 0848   HGB 13.5 10/04/2020 1453   HCT 39.3 12/07/2020 0848   PLT 265.0 12/07/2020 0848   PLT 206 10/04/2020 1453   MCV 98.9 12/07/2020 0848   MCH 31.5 10/04/2020 1453   MCHC 33.7 12/07/2020 0848   RDW 14.2 12/07/2020 0848   LYMPHSABS 1.5 12/07/2020 0848   MONOABS 1.2 (H) 12/07/2020 0848   EOSABS 0.2 12/07/2020 0848   BASOSABS 0.1 12/07/2020 0848     Chest Imaging: 08/08/2021: Nuclear medicine pet imaging hypermetabolic uptake within within 10 mm lung nodule. The patient's images have been independently reviewed by me.    Pulmonary Functions Testing Results: No flowsheet data found.  FeNO:   Pathology:   Echocardiogram:   Heart Catheterization:     Assessment & Plan:     ICD-10-CM   1. Lung nodule  R91.1 Procedural/ Surgical Case Request: ROBOTIC ASSISTED NAVIGATIONAL BRONCHOSCOPY    Ambulatory referral to Pulmonology    2. COPD with acute exacerbation ([place])  J44.1     3. Abnormal PET scan, lung  R94.2       Discussion: This is a 76 year old gentleman longstanding history of smoking on max therapy for his advanced emphysema, chronic hypoxemic respiratory failure.  Recent increased cough and sputum production completed course antibiotics but still feels like he has a lot of sputum production.  Plan: Treat his exacerbation to ensure he is at best respiratory status before consideration for bronchoscopy. Will give a round of Augmentin plus prednisone taper. Discussed risk benefits and alternatives of proceeding with bronchoscopy. Patient is agreeable to proceed. We talked about the risk of bleeding and pneumothorax. He is on anticoagulation his last dose should be on the February 25 prior to  procedure. Tentative bronchoscopy date on February 28. We appreciate the PCC's help with scheduling.   Current Outpatient Medications:    acetaminophen (TYLENOL) 500 MG tablet, Take 1,000 mg by mouth every 6 (six) hours as needed for moderate pain. , Disp: , Rfl:    albuterol (VENTOLIN HFA) 108 (90 Base) MCG/ACT inhaler, USE 2 PUFFS EVERY 6 HOURS  AS NEEDED FOR WHEEZING, Disp: 18 g, Rfl: 3   amoxicillin-clavulanate (AUGMENTIN) 875-125 MG tablet, Take 1 tablet by mouth 2 (two) times daily for 14 days., Disp: 28 tablet, Rfl: 0   azithromycin (ZITHROMAX) 250 MG tablet, TAKE 1 TABLET BY MOUTH  EVERY MONDAY, WEDNESDAY,  AND FRIDAY, Disp: 24 tablet, Rfl: 1   Calcium Carbonate-Vitamin D (CALCIUM 600+D PO), Take 2 tablets by mouth daily., Disp: , Rfl:    cefdinir (OMNICEF) 300 MG capsule, Take 1 capsule (300 mg total) by mouth 2 (two) times daily., Disp: 10 capsule, Rfl: 0   cycloSPORINE (RESTASIS) 0.05 % ophthalmic emulsion, Place 1 drop into both eyes 2 (two) times daily as needed (dry eyes). , Disp: , Rfl:    ELIQUIS 2.5 MG TABS tablet, TAKE 1 TABLET BY MOUTH  TWICE DAILY, Disp: 180 tablet, Rfl: 3   Ferrous Sulfate (IRON) 325 (65 Fe) MG TABS, Take 1 tablet by mouth daily., Disp: , Rfl:    fluticasone (FLONASE) 50 MCG/ACT nasal spray, Place 2 sprays into both nostrils daily., Disp: 48 g, Rfl: 3   folic acid (FOLVITE) 1 MG tablet, Take 1 tablet (1 mg total) by mouth daily., Disp: , Rfl:    furosemide (LASIX) 20 MG tablet, TAKE 1 TABLET (20 MG TOTAL) BY MOUTH DAILY AS NEEDED FOR FLUID OR EDEMA., Disp: 90 tablet, Rfl: 3   Multiple Vitamin (MULTIVITAMIN WITH MINERALS) TABS tablet, Take 1 tablet by mouth daily., Disp: , Rfl:    mupirocin cream (BACTROBAN) 2 %, Apply 1 application topically 2 (two) times daily., Disp: 22 g, Rfl: 0   OXYGEN, Inhale 4-5 L into the lungs continuous. , Disp: , Rfl:    pantoprazole (PROTONIX) 20 MG tablet, Take 1 tablet (20 mg total) by mouth daily., Disp: 90  tablet, Rfl: 1   predniSONE (DELTASONE) 10 MG tablet, Take 4 tabs by mouth once daily x4 days, then 3 tabs x4 days, 2 tabs x4 days, 1 tab x4 days and stop., Disp: 40 tablet, Rfl: 0   Respiratory Therapy Supplies (FLUTTER) DEVI, 10 times Twice a day and prn as needed, may increase if feeling worse, Disp: 1 each, Rfl: 0   roflumilast (DALIRESP) 500 MCG TABS tablet, Take 1 tablet (500 mcg total)  by mouth daily., Disp: 90 tablet, Rfl: 0   rosuvastatin (CRESTOR) 10 MG tablet, TAKE 1 TABLET BY MOUTH  DAILY, Disp: 90 tablet, Rfl: 3   sodium chloride HYPERTONIC 3 % nebulizer solution, Take by nebulization daily., Disp: 90 mL, Rfl: 12   SYMBICORT 160-4.5 MCG/ACT inhaler, USE 2 INHALATIONS BY MOUTH  TWICE DAILY, Disp: 30.6 g, Rfl: 3   Tiotropium Bromide Monohydrate (SPIRIVA RESPIMAT) 2.5 MCG/ACT AERS, USE 2 INHALATIONS BY MOUTH  DAILY, Disp: 12 g, Rfl: 3   traMADol (ULTRAM) 50 MG tablet, Take 1 tablet (50 mg total) by mouth every 12 (twelve) hours as needed for moderate pain or severe pain., Disp: 90 tablet, Rfl: 1  I spent 63 minutes dedicated to the care of this patient on the date of this encounter to include pre-visit review of records, face-to-face time with the patient discussing conditions above, post visit ordering of testing, clinical documentation with the electronic health record, making appropriate referrals as documented, and communicating necessary findings to members of the patients care team.   Garner Nash, DO [facility] 08/09/2021 10:03 AM

## 2021-08-14 ENCOUNTER — Other Ambulatory Visit: Payer: Self-pay | Admitting: Family Medicine

## 2021-08-16 ENCOUNTER — Encounter: Payer: Self-pay | Admitting: Pulmonary Disease

## 2021-08-16 NOTE — Telephone Encounter (Signed)
Mychart message sent by pt's spouse: HALLIE ISHIDA II  P [facility] Pool (supporting Icard, Bradley L, DO) 2 hours ago (7:31 AM)   Adding to previous message.  I wonder if he has oral thrush also.  Can you also call in something for thrush along  with changing medication from amoxicillin to ominicef.      MR, please advise.

## 2021-08-16 NOTE — Telephone Encounter (Signed)
DC amox  Start omnicef 300mg  twice daily x 14 days   Allergies  Allergen Reactions   Tape Other (See Comments)    SKIN IS VERY THIN, TEARS SKIN; CAN ONLY USE COBAN WRAPS DUE TO CONDITION OF SKIN!!   Ciprofloxacin Nausea Only    Sick on stomach, weak/tired   Levaquin [Levofloxacin] Other (See Comments)    hallucinations   Lipitor [Atorvastatin] Rash

## 2021-08-16 NOTE — Telephone Encounter (Signed)
Mychart message sent by pt's spouse: Francisco [name]  [facility] Pool (supporting Icard, Octavio Graves, DO) 3 hours ago (6:58 AM)   Francisco [name] has experience sore throat, hoarseness taking amoxicillin.  Will the drug Omnicef  300 mg. Be effective and stop amoxicillin?  If so, please call I the 14 day Omnicef in at [place].  His health has declined in taking  Amoxillin and I know we were trying to get him well as we can before the procedure on 2/28.  Francisco [name] is what Dr. Onnie Graham used.  If you need to speak to Francisco [name] call [phone].  I did a Covid test yesterday and it was negative.     MR, please advise.

## 2021-08-16 NOTE — Telephone Encounter (Signed)
Message was originally sent to [name] but sending to Dr. Valeta Harms as spouse states that he was the one who pt pt on amoxicillin. Please advise.

## 2021-08-17 ENCOUNTER — Other Ambulatory Visit: Payer: Self-pay

## 2021-08-17 MED ORDER — CEFDINIR 300 MG PO CAPS: 300.0000 mg | ORAL_CAPSULE | Freq: Two times a day (BID) | ORAL | 0 refills | Status: DC

## 2021-08-20 MED ORDER — CEFDINIR 300 MG PO CAPS: 300.0000 mg | ORAL_CAPSULE | Freq: Two times a day (BID) | ORAL | 0 refills | Status: DC

## 2021-08-24 ENCOUNTER — Other Ambulatory Visit: Payer: Self-pay | Admitting: Internal Medicine

## 2021-08-24 DIAGNOSIS — J449 Chronic obstructive pulmonary disease, unspecified: Secondary | ICD-10-CM

## 2021-08-31 ENCOUNTER — Other Ambulatory Visit: Payer: Self-pay | Admitting: Pulmonary Disease

## 2021-08-31 LAB — SARS CORONAVIRUS 2 (TAT 6-24 HRS): SARS Coronavirus 2: NEGATIVE

## 2021-09-03 ENCOUNTER — Encounter (HOSPITAL_COMMUNITY): Payer: Self-pay | Admitting: Pulmonary Disease

## 2021-09-03 ENCOUNTER — Other Ambulatory Visit: Payer: Self-pay

## 2021-09-03 NOTE — Anesthesia Preprocedure Evaluation (Addendum)
Anesthesia Evaluation  Patient identified by MRN, date of birth, ID band Patient awake    Reviewed: Allergy & Precautions, NPO status , Patient's Chart, lab work & pertinent test results, reviewed documented beta blocker date and time   Airway Mallampati: III  TM Distance: >3 FB Neck ROM: Full    Dental  (+) Teeth Intact, Dental Advisory Given   Pulmonary shortness of breath, with exertion, lying and Long-Term Oxygen Therapy, pneumonia, resolved, COPD,  COPD inhaler, former smoker, PE Pulmonary nodule Hx/o PTE   breath sounds clear to auscultation + decreased breath sounds      Cardiovascular hypertension, Pt. on medications + Peripheral Vascular Disease  Normal cardiovascular exam Rhythm:Regular Rate:Normal  S/P Stent Iliac  S/P Femoral endarterectomy x 2   Neuro/Psych negative neurological ROS  negative psych ROS   GI/Hepatic Neg liver ROS, hiatal hernia, Hx/o GI bleed   Endo/Other  Hyperlipidemia  Renal/GU negative Renal ROS  negative genitourinary   Musculoskeletal negative musculoskeletal ROS (+) Hx/o compression Fx spine   Abdominal   Peds  Hematology  (+) Blood dyscrasia, anemia , Eliquis therapy-last dose 2/24   Anesthesia Other Findings   Reproductive/Obstetrics                           Anesthesia Physical Anesthesia Plan  ASA: 3  Anesthesia Plan: General   Post-op Pain Management: Minimal or no pain anticipated   Induction: Intravenous  PONV Risk Score and Plan: 3 and Treatment may vary due to age or medical condition and Ondansetron  Airway Management Planned: Oral ETT  Additional Equipment: None  Intra-op Plan:   Post-operative Plan: Extubation in OR  Informed Consent: I have reviewed the patients History and Physical, chart, labs and discussed the procedure including the risks, benefits and alternatives for the proposed anesthesia with the patient or authorized  representative who has indicated his/her understanding and acceptance.     Dental advisory given  Plan Discussed with: CRNA and Anesthesiologist  Anesthesia Plan Comments:        Anesthesia Quick Evaluation

## 2021-09-03 NOTE — Progress Notes (Addendum)
Mr. Qin denies chest pain, patient dose become short of breath easily, due to COPD. Mr. Sargent wears oxygen at 4 liters 24/7. Patient reports that he dose not need Albuterol  inlhaler very often.Mr. Whittlesey tested negative for Covid on 08/31/21. I reminded patient to wear a mask if he is around anyone who dose not live in the home.  Mr. Nee has a history of a PE and he has had stents in legs.  Patient took last dose of Eliquis on 2/23/223.  Mr. Livecchi PCP is Dr. Ansel Bong Pulmonologist is  [name].  I instructed Mr. Heritage to shower with antibiotic soap, if it is available.  Dry off with a clean towel. Do not put lotion, powder, cologne or deodorant or makeup.No jewelry or piercings. Men may shave their face and neck. Woman should not shave. No nail polish, artificial or acrylic nails. Wear clean clothes, brush your teeth. Glasses, contact lens,dentures or partials may not be worn in the OR. If you need to wear them, please bring a case for glasses, do not wear contacts or bring a case, the hospital does not have contact cases, dentures or partials will have to be removed , make sure they are clean, we will provide a denture cup to put them in. You will need some one to drive you home and a responsible person over the age of 30 to stay with you for the first 24 hours after surgery.

## 2021-09-04 ENCOUNTER — Ambulatory Visit (HOSPITAL_COMMUNITY): Payer: Medicare Other

## 2021-09-04 ENCOUNTER — Encounter (HOSPITAL_COMMUNITY): Payer: Self-pay | Admitting: Pulmonary Disease

## 2021-09-04 ENCOUNTER — Encounter (HOSPITAL_COMMUNITY)
Admission: RE | Disposition: A | Discharge status: Home / Self Care | Payer: Self-pay | Source: Home / Self Care | Attending: Pulmonary Disease

## 2021-09-04 ENCOUNTER — Ambulatory Visit (HOSPITAL_COMMUNITY): Payer: Medicare Other | Admitting: Physician Assistant

## 2021-09-04 ENCOUNTER — Ambulatory Visit (HOSPITAL_COMMUNITY)
Admission: RE | Admit: 2021-09-04 | Discharge: 2021-09-04 | Disposition: A | Discharge status: Home / Self Care | Payer: Medicare Other | Attending: Pulmonary Disease | Admitting: Pulmonary Disease

## 2021-09-04 ENCOUNTER — Ambulatory Visit (HOSPITAL_BASED_OUTPATIENT_CLINIC_OR_DEPARTMENT_OTHER): Payer: Medicare Other | Admitting: Physician Assistant

## 2021-09-04 ENCOUNTER — Other Ambulatory Visit: Payer: Self-pay

## 2021-09-04 DIAGNOSIS — Z9981 Dependence on supplemental oxygen: Secondary | ICD-10-CM | POA: Diagnosis not present

## 2021-09-04 DIAGNOSIS — D649 Anemia, unspecified: Secondary | ICD-10-CM | POA: Diagnosis not present

## 2021-09-04 DIAGNOSIS — I10 Essential (primary) hypertension: Secondary | ICD-10-CM

## 2021-09-04 DIAGNOSIS — Z87891 Personal history of nicotine dependence: Secondary | ICD-10-CM | POA: Insufficient documentation

## 2021-09-04 DIAGNOSIS — C3431 Malignant neoplasm of lower lobe, right bronchus or lung: Secondary | ICD-10-CM | POA: Diagnosis not present

## 2021-09-04 DIAGNOSIS — Z7901 Long term (current) use of anticoagulants: Secondary | ICD-10-CM | POA: Diagnosis not present

## 2021-09-04 DIAGNOSIS — I739 Peripheral vascular disease, unspecified: Secondary | ICD-10-CM | POA: Insufficient documentation

## 2021-09-04 DIAGNOSIS — J449 Chronic obstructive pulmonary disease, unspecified: Secondary | ICD-10-CM

## 2021-09-04 DIAGNOSIS — Z419 Encounter for procedure for purposes other than remedying health state, unspecified: Secondary | ICD-10-CM

## 2021-09-04 DIAGNOSIS — R911 Solitary pulmonary nodule: Secondary | ICD-10-CM | POA: Diagnosis not present

## 2021-09-04 DIAGNOSIS — Z8719 Personal history of other diseases of the digestive system: Secondary | ICD-10-CM | POA: Diagnosis not present

## 2021-09-04 DIAGNOSIS — E785 Hyperlipidemia, unspecified: Secondary | ICD-10-CM | POA: Insufficient documentation

## 2021-09-04 DIAGNOSIS — Z95828 Presence of other vascular implants and grafts: Secondary | ICD-10-CM | POA: Diagnosis not present

## 2021-09-04 DIAGNOSIS — Z9889 Other specified postprocedural states: Secondary | ICD-10-CM

## 2021-09-04 HISTORY — PX: BRONCHIAL BIOPSY: SHX5109

## 2021-09-04 HISTORY — PX: VIDEO BRONCHOSCOPY WITH RADIAL ENDOBRONCHIAL ULTRASOUND: SHX6849

## 2021-09-04 HISTORY — PX: BRONCHIAL NEEDLE ASPIRATION BIOPSY: SHX5106

## 2021-09-04 HISTORY — PX: BRONCHIAL BRUSHINGS: SHX5108

## 2021-09-04 HISTORY — PX: FIDUCIAL MARKER PLACEMENT: SHX6858

## 2021-09-04 LAB — CBC
HCT: 36.4 % — ABNORMAL LOW (ref 39.0–52.0)
Hemoglobin: 12.4 g/dL — ABNORMAL LOW (ref 13.0–17.0)
MCH: 33.1 pg (ref 26.0–34.0)
MCHC: 34.1 g/dL (ref 30.0–36.0)
MCV: 97.1 fL (ref 80.0–100.0)
Platelets: 152 10*3/uL (ref 150–400)
RBC: 3.75 MIL/uL — ABNORMAL LOW (ref 4.22–5.81)
RDW: 12.8 % (ref 11.5–15.5)
WBC: 11.8 10*3/uL — ABNORMAL HIGH (ref 4.0–10.5)
nRBC: 0 % (ref 0.0–0.2)

## 2021-09-04 IMAGING — DX DG CHEST 1V PORT
1 series · 1 of 1 positions shown · non-contrast
Comparison: Chest x-ray dated [DATE]

CLINICAL DATA: Status post bronchoscopy

EXAM:
PORTABLE CHEST 1 VIEW

[chest ap]
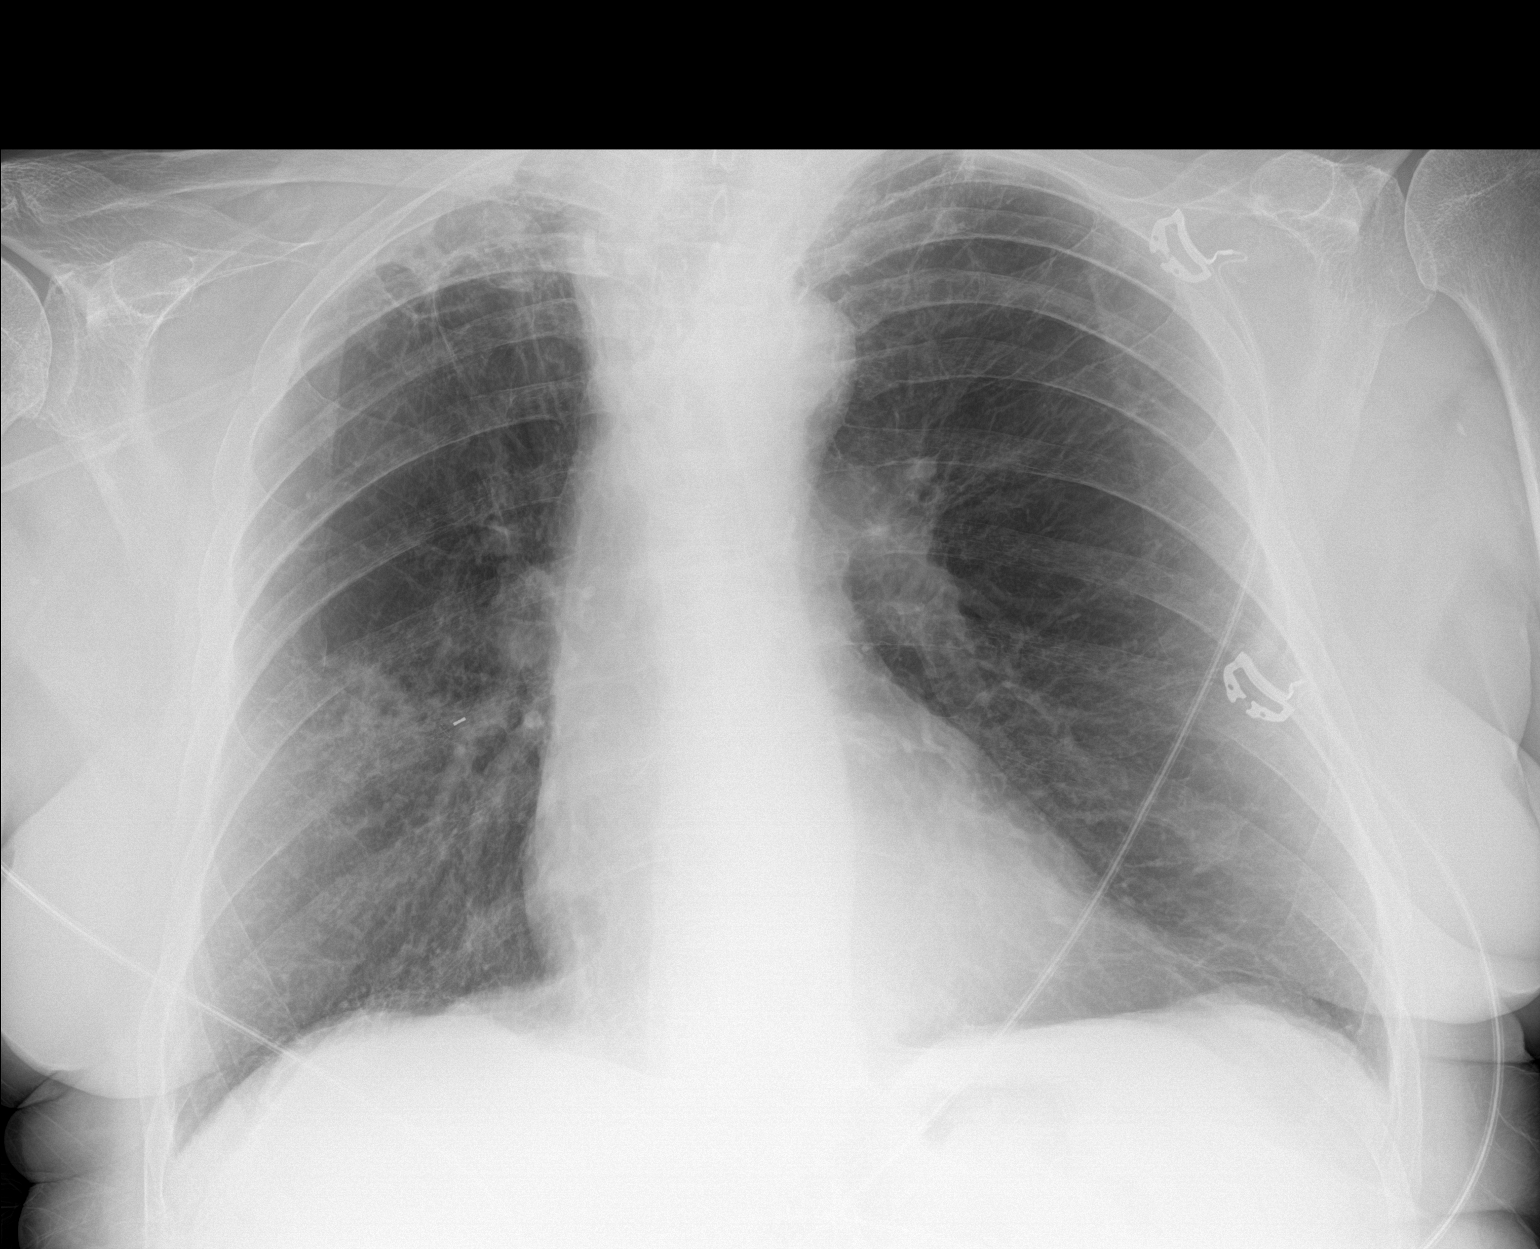

[1 of 1 positions shown; findings below may reference images not displayed]

FINDINGS: Cardiac and mediastinal contours within normal limits. Unchanged
scarring of the right lung apex. Mild right lower lobe airspace
opacity, likely post bronchoscopy changes. No evidence of effusion
or pneumothorax.
IMPRESSION: No evidence of pneumothorax.

## 2021-09-04 SURGERY — BRONCHOSCOPY, WITH BIOPSY USING ELECTROMAGNETIC NAVIGATION
Anesthesia: General | Laterality: Right

## 2021-09-04 MED ORDER — DEXAMETHASONE SODIUM PHOSPHATE 10 MG/ML IJ SOLN
INTRAMUSCULAR | Status: DC | PRN
  Administered 2021-09-04: 10 mg via INTRAVENOUS

## 2021-09-04 MED ORDER — ROCURONIUM BROMIDE 10 MG/ML (PF) SYRINGE
PREFILLED_SYRINGE | INTRAVENOUS | Status: DC | PRN
  Administered 2021-09-04: 60 mg via INTRAVENOUS

## 2021-09-04 MED ORDER — CHLORHEXIDINE GLUCONATE 0.12 % MT SOLN
OROMUCOSAL | Status: AC
Start: 2021-09-04 — End: 2021-09-04
  Administered 2021-09-04: 15 mL
  Filled 2021-09-04: qty 15

## 2021-09-04 MED ORDER — FENTANYL CITRATE (PF) 100 MCG/2ML IJ SOLN
INTRAMUSCULAR | Status: DC | PRN
  Administered 2021-09-04: 100 ug via INTRAVENOUS

## 2021-09-04 MED ORDER — SUGAMMADEX SODIUM 200 MG/2ML IV SOLN
INTRAVENOUS | Status: DC | PRN
  Administered 2021-09-04 (×2): 100 mg via INTRAVENOUS

## 2021-09-04 MED ORDER — PROPOFOL 10 MG/ML IV BOLUS
INTRAVENOUS | Status: DC | PRN
  Administered 2021-09-04: 100 mg via INTRAVENOUS

## 2021-09-04 MED ORDER — PHENYLEPHRINE HCL-NACL 20-0.9 MG/250ML-% IV SOLN
INTRAVENOUS | Status: DC | PRN
  Administered 2021-09-04: 50 ug/min via INTRAVENOUS

## 2021-09-04 MED ORDER — LACTATED RINGERS IV SOLN: INTRAVENOUS | Status: DC

## 2021-09-04 MED ORDER — PHENYLEPHRINE 40 MCG/ML (10ML) SYRINGE FOR IV PUSH (FOR BLOOD PRESSURE SUPPORT)
PREFILLED_SYRINGE | INTRAVENOUS | Status: DC | PRN
  Administered 2021-09-04 (×4): 80 ug via INTRAVENOUS
  Administered 2021-09-04: 40 ug via INTRAVENOUS
  Administered 2021-09-04: 120 ug via INTRAVENOUS

## 2021-09-04 MED ORDER — ONDANSETRON HCL 4 MG/2ML IJ SOLN
INTRAMUSCULAR | Status: DC | PRN
  Administered 2021-09-04: 4 mg via INTRAVENOUS

## 2021-09-04 MED ORDER — LIDOCAINE 2% (20 MG/ML) 5 ML SYRINGE
INTRAMUSCULAR | Status: DC | PRN
  Administered 2021-09-04: 60 mg via INTRAVENOUS

## 2021-09-04 SURGICAL SUPPLY — 1 items: superlock fiducial marker ×1 IMPLANT

## 2021-09-04 NOTE — Anesthesia Procedure Notes (Signed)
Procedure Name: Intubation Date/Time: 09/04/2021 7:44 AM Performed by: Janace Litten, CRNA Pre-anesthesia Checklist: Patient identified, Emergency Drugs available, Suction available and Patient being monitored Patient Re-evaluated:Patient Re-evaluated prior to induction Oxygen Delivery Method: Circle System Utilized Preoxygenation: Pre-oxygenation with 100% oxygen Induction Type: IV induction Ventilation: Mask ventilation without difficulty Laryngoscope Size: Mac and 4 Grade View: Grade I Tube type: Oral Tube size: 8.5 mm Number of attempts: 1 Airway Equipment and Method: Stylet and Oral airway Placement Confirmation: ETT inserted through vocal cords under direct vision, positive ETCO2 and breath sounds checked- equal and bilateral Secured at: 22 cm Tube secured with: Tape Dental Injury: Teeth and Oropharynx as per pre-operative assessment

## 2021-09-04 NOTE — Transfer of Care (Signed)
Immediate Anesthesia Transfer of Care Note  Patient: Francisco [name]  Procedure(s) Performed: ROBOTIC ASSISTED NAVIGATIONAL BRONCHOSCOPY (Right) BRONCHIAL NEEDLE ASPIRATION BIOPSIES BRONCHIAL BRUSHINGS BRONCHIAL BIOPSIES FIDUCIAL MARKER PLACEMENT VIDEO BRONCHOSCOPY WITH RADIAL ENDOBRONCHIAL ULTRASOUND  Patient Location: PACU  Anesthesia Type:General  Level of Consciousness: awake, alert  and patient cooperative  Airway & Oxygen Therapy: Patient Spontanous Breathing and Patient connected to face mask oxygen  Post-op Assessment: Report given to RN and Post -op Vital signs reviewed and stable  Post vital signs: Reviewed and stable  Last Vitals:  Vitals Value Taken Time  BP 129/75 09/04/21 0847  Temp    Pulse 109 09/04/21 0849  Resp 14 09/04/21 0849  SpO2 97 % 09/04/21 0849  Vitals shown include unvalidated device data.  Last Pain:  Vitals:   09/04/21 0604  TempSrc:   PainSc: 0-No pain         Complications: No notable events documented.

## 2021-09-04 NOTE — Op Note (Addendum)
Video Bronchoscopy with Robotic Assisted Bronchoscopic Navigation   Date of Operation: 09/04/2021   Pre-op Diagnosis: Right lower lobe lung nodule  Post-op Diagnosis: Right lower lobe lung nodule  Surgeon: Garner Nash, DO  Assistants: None  Anesthesia: General endotracheal anesthesia  Operation: Flexible video fiberoptic bronchoscopy with robotic assistance and biopsies.  Estimated Blood Loss: Minimal  Complications: None  Indications and History: Francisco [name] is a 76 y.o. male with history of right lower lobe lung nodule. The risks, benefits, complications, treatment options and expected outcomes were discussed with the patient.  The possibilities of pneumothorax, pneumonia, reaction to medication, pulmonary aspiration, perforation of a viscus, bleeding, failure to diagnose a condition and creating a complication requiring transfusion or operation were discussed with the patient who freely signed the consent.    Description of Procedure: The patient was seen in the Preoperative Area, was examined and was deemed appropriate to proceed.  The patient was taken to [facility] endoscopy room 3, identified as Francisco [name] and the procedure verified as Flexible Video Fiberoptic Bronchoscopy.  A Time Out was held and the above information confirmed.   Prior to the date of the procedure a high-resolution CT scan of the chest was performed. Utilizing ION software program a virtual tracheobronchial tree was generated to allow the creation of distinct navigation pathways to the patient's parenchymal abnormalities. After being taken to the operating room general anesthesia was initiated and the patient  was orally intubated. The video fiberoptic bronchoscope was introduced via the endotracheal tube and a general inspection was performed which showed normal right and left lung anatomy, aspiration of the bilateral mainstems was completed to remove any remaining secretions.  Patient had a significant  amount of thick inspissated secretions within the bilateral lower lobes and tracking along the left mainstem.  This was suctioned clear prior to beginning of the procedure. Robotic catheter inserted into patient's endotracheal tube.   Target #1 right lower lobe lung nodule: The distinct navigation pathways prepared prior to this procedure were then utilized to navigate to patient's lesion identified on CT scan. The robotic catheter was secured into place and the vision probe was withdrawn.  Lesion location was approximated using fluoroscopy, three-dimensional cone beam CT imaging and radial endobronchial ultrasound for peripheral targeting. Under fluoroscopic guidance transbronchial needle brushings, transbronchial needle biopsies, and transbronchial forceps biopsies were performed to be sent for cytology and pathology.  Following tissue sampling a single fiducial was placed under fluoroscopic guidance with fiducial catheter wire and delivery kit.  At the end of the procedure a general airway inspection was performed and there was no evidence of active bleeding. The bronchoscope was removed.  The patient tolerated the procedure well. There was no significant blood loss and there were no obvious complications. A post-procedural chest x-ray is pending.  Samples Target #1: 1. Transbronchial needle brushings from right lower lobe pulmonary nodule 2. Transbronchial Wang needle biopsies from right lower lobe pulmonary nodule 3. Transbronchial forceps biopsies from right lower lobe pulmonary nodule  Plans:  The patient will be discharged from the PACU to home when recovered from anesthesia and after chest x-ray is reviewed. We will review the cytology, pathology results with the patient when they become available. Outpatient followup will be with Garner Nash, DO.   Garner Nash, DO [facility] 09/04/2021 8:38 AM

## 2021-09-04 NOTE — Discharge Instructions (Signed)
Flexible Bronchoscopy, Care After This sheet gives you information about how to care for yourself after your test. Your doctor may also give you more specific instructions. If you have problems or questions, contact your doctor. Follow these instructions at home: Eating and drinking Do not eat or drink anything (not even water) for 2 hours after your test, or until your numbing medicine (local anesthetic) wears off. When your numbness is gone and your cough and gag reflexes have come back, you may: Eat only soft foods. Slowly drink liquids. The day after the test, go back to your normal diet. Driving Do not drive for 24 hours if you were given a medicine to help you relax (sedative). Do not drive or use heavy machinery while taking prescription pain medicine. General instructions  Take over-the-counter and prescription medicines only as told by your doctor. Return to your normal activities as told. Ask what activities are safe for you. Do not use any products that have nicotine or tobacco in them. This includes cigarettes and e-cigarettes. If you need help quitting, ask your doctor. Keep all follow-up visits as told by your doctor. This is important. It is very important if you had a tissue sample (biopsy) taken. Get help right away if: You have shortness of breath that gets worse. You get light-headed. You feel like you are going to pass out (faint). You have chest pain. You cough up: More than a little blood. More blood than before. Summary Do not eat or drink anything (not even water) for 2 hours after your test, or until your numbing medicine wears off. Do not use cigarettes. Do not use e-cigarettes. Get help right away if you have chest pain.  This information is not intended to replace advice given to you by your health care provider. Make sure you discuss any questions you have with your health care provider. Document Released: 04/21/2009 Document Revised: 06/06/2017 Document  Reviewed: 07/12/2016 Elsevier Patient Education  2020 [employer].

## 2021-09-04 NOTE — Anesthesia Postprocedure Evaluation (Signed)
Anesthesia Post Note  Patient: Francisco [name]  Procedure(s) Performed: ROBOTIC ASSISTED NAVIGATIONAL BRONCHOSCOPY (Right) BRONCHIAL NEEDLE ASPIRATION BIOPSIES BRONCHIAL BRUSHINGS BRONCHIAL BIOPSIES FIDUCIAL MARKER PLACEMENT VIDEO BRONCHOSCOPY WITH RADIAL ENDOBRONCHIAL ULTRASOUND     Patient location during evaluation: PACU Anesthesia Type: General Level of consciousness: awake and alert and oriented Pain management: pain level controlled Vital Signs Assessment: post-procedure vital signs reviewed and stable Respiratory status: spontaneous breathing, nonlabored ventilation and respiratory function stable Cardiovascular status: blood pressure returned to baseline and stable Postop Assessment: no apparent nausea or vomiting Anesthetic complications: no   No notable events documented.  Last Vitals:  Vitals:   09/04/21 0902 09/04/21 0917  BP: 119/76 127/81  Pulse: (!) 114 (!) 108  Resp: 14 12  Temp:  36.9 C  SpO2: 94% 93%    Last Pain:  Vitals:   09/04/21 0917  TempSrc:   PainSc: 0-No pain                 Alencia Gordon A.

## 2021-09-04 NOTE — Interval H&P Note (Signed)
History and Physical Interval Note:  09/04/2021 7:08 AM  Francisco [name]  has presented today for surgery, with the diagnosis of lung nodule.  The various methods of treatment have been discussed with the patient and family. After consideration of risks, benefits and other options for treatment, the patient has consented to  Procedure(s) with comments: ROBOTIC ASSISTED NAVIGATIONAL BRONCHOSCOPY (Right) - ION w/ CIOS as a surgical intervention.  The patient's history has been reviewed, patient examined, no change in status, stable for surgery.  I have reviewed the patient's chart and labs.  Questions were answered to the patient's satisfaction.     [place]

## 2021-09-05 ENCOUNTER — Encounter (HOSPITAL_COMMUNITY): Payer: Self-pay | Admitting: Pulmonary Disease

## 2021-09-07 LAB — CYTOLOGY - NON PAP

## 2021-09-11 ENCOUNTER — Ambulatory Visit (INDEPENDENT_AMBULATORY_CARE_PROVIDER_SITE_OTHER): Payer: Medicare Other | Admitting: Acute Care

## 2021-09-11 ENCOUNTER — Encounter: Payer: Self-pay | Admitting: Acute Care

## 2021-09-11 ENCOUNTER — Other Ambulatory Visit: Payer: Self-pay

## 2021-09-11 VITALS — BP 104/62 | HR 106 | Temp 98.6°F | Ht 72.0 in | Wt 185.0 lb

## 2021-09-11 DIAGNOSIS — J441 Chronic obstructive pulmonary disease with (acute) exacerbation: Secondary | ICD-10-CM

## 2021-09-11 DIAGNOSIS — C3490 Malignant neoplasm of unspecified part of unspecified bronchus or lung: Secondary | ICD-10-CM | POA: Diagnosis not present

## 2021-09-11 MED ORDER — PREDNISONE 10 MG PO TABS: ORAL_TABLET | ORAL | 0 refills | Status: DC

## 2021-09-11 MED ORDER — DOXYCYCLINE HYCLATE 100 MG PO TABS: 100.0000 mg | ORAL_TABLET | Freq: Two times a day (BID) | ORAL | 0 refills | Status: DC

## 2021-09-11 NOTE — Patient Instructions (Addendum)
It is good to see you today. ?We will send in a prescription for Doxycycline 100 mg twice daily for your COPD flare. ?Take until gone. ?Take with a full glass of water. ?If in the sunshine , wear sun block, and a hat.  ?Please take Probiotic when on antibiotic.  ?I like Culturelle , take one tablet once daily.  ?Prednisone taper; 10 mg tablets: 4 tabs x 2 days, 3 tabs x 2 days, 2 tabs x 2 days 1 tab x 2 days then stop.  ?Already scheduled for Follow up with MR on 10/25/2021. ?Note your daily symptoms > remember "red flags" for COPD:  Increase in cough, change in color od secretions, increase in sputum production, increase in shortness of breath or activity intolerance. If you notice these symptoms, please call to be seen.   ?Stop azithromycin while on Doxycycline , then resume when finished. ?Continue Daliresp ?Please contact office for sooner follow up if symptoms do not improve or worsen or seek emergency care    ?

## 2021-09-11 NOTE — Progress Notes (Signed)
History of Present Illness Francisco [name] is a 77 y.o. 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  Advanced emphysema on CT imaging found to have a right-sided lung nodule.  Sent for PET scan imaging which revealed hypermetabolic uptake. He was referred to Dr. Valeta Harms for biopsy which was done 09/04/2021. He is on Eliquis. There are no PFT's on file.    09/11/2021 Pt. Presents for follow up. He had Flexible video fiberoptic bronchoscopy with robotic assistance and biopsies on 09/04/2021. He states he has done well after his biopsy. No significant bleeding. Just a scan amount the day after. He did have a lot of rib pain , that has subsequently improved. I gave the patient the biopsy results. We discussed this in full to include treatment options and all questions were asked and answered. [name] Kitchen   He says he is currently in the middle of a COPD flare. He has thick yellow green secretions .He also endorses a bad cough.  He does have a flutter valve. He does endorse some wheezing.He resumed his Eliquis on 09/06/2021.We will treat him for his COPD flare today.He is compliant with his Daliresp, Spiriva and Symbicort.    I have referred to radiation oncology, as he is oxygen dependent and in a wheelchair, SBRT is best option for this single nodule. . Both he and his wife had  questions that were answered. I have asked them to call the office if they need anything at all.   Test Results: Cytology 09/04/2021 MICROSCOPIC DIAGNOSIS:  A. 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 LUNG, RLL, BRUSHING:  - No malignant cells identified   Amendment comment: This case is amended to correct the diagnosis from a  non-small cell to small cell carcinoma.  Immunohistochemistry shows  patchy positivity with cytokeratin 5/6 and weak positivity  with CD56.  The malignant cells are negative with TTF-1, chromogranin,  synaptophysin, p40 and p63.  Ki-67 shows a high proliferation rate.   PET scan 08/08/2021 Solitary hypermetabolic pulmonary nodule in the RIGHT lower lobe with intense metabolic activity is most consistent PRIMARY BRONCHOGENIC CARCINOMA. 2. No mediastinal adenopathy, supraclavicular adenopathy or distant metastatic disease. No skeletal metastasis.    CBC Latest Ref Rng & Units 09/04/2021 12/07/2020 10/04/2020  WBC 4.0 - 10.5 K/uL 11.8(H) 10.9(H) 9.4  Hemoglobin 13.0 - 17.0 g/dL 12.4(L) 13.3 13.5  Hematocrit 39.0 - 52.0 % 36.4(L) 39.3 39.4  Platelets 150 - 400 K/uL 152 265.0 206    BMP Latest Ref Rng & Units 12/07/2020 10/04/2020 09/20/2020  Glucose 70 - 99 mg/dL 77 119(H) 100(H)  BUN 6 - 23 mg/dL 18 25(H) 12  Creatinine 0.40 - 1.50 mg/dL 1.05 1.37(H) 1.06  BUN/Creat Ratio 6 - 22 (calc) - - -  Sodium 135 - 145 mEq/L 140 140 138  Potassium 3.5 - 5.1 mEq/L 4.8 5.0 4.3  Chloride 96 - 112 mEq/L 98 98 100  CO2 19 - 32 mEq/L '27 30 30  ' Calcium 8.4 - 10.5 mg/dL 9.3 9.6 9.2    BNP    Component Value Date/Time   BNP 186.4 (H) 12/15/2019 1136    ProBNP    Component Value Date/Time   PROBNP 50.0 04/03/2018 1148    PFT No results found for: FEV1PRE, FEV1POST, FVCPRE, FVCPOST, TLC, DLCOUNC, PREFEV1FVCRT, PSTFEV1FVCRT  DG Chest Port 1 View  Result Date: 09/04/2021 CLINICAL DATA:  Status post bronchoscopy EXAM: PORTABLE CHEST 1 VIEW COMPARISON:  Chest x-ray dated February 18, 2020 FINDINGS: Cardiac and mediastinal contours within normal limits. Unchanged scarring of the right lung apex. Mild right lower lobe airspace opacity, likely post bronchoscopy changes. No evidence of effusion or pneumothorax. IMPRESSION: No evidence of pneumothorax. Electronically Signed   By: Yetta Glassman M.D.   On: 09/04/2021 09:03   DG C-ARM BRONCHOSCOPY  Result Date: 09/04/2021 C-ARM BRONCHOSCOPY: Fluoroscopy was utilized by the requesting  physician.  No radiographic interpretation.     Past medical hx Past Medical History:  Diagnosis Date   Chronic back pain    "mid and lower" (04/07/2018)   Chronic rhinitis    -Sinus Ct 08/01/2009 >> Bilateral maxillary sinusitis with some mucosal thickeningin the sphenoid and frontal sinuses as well with air fluid levels present -chronic rhinitis flyer Aug 04, 2009   Compressed spine fracture [facility])    COPD (chronic obstructive pulmonary disease) (HCC)    PFT's rec Jul 17, 2009   Dyspnea    Emphysema lung (HCC)    On home oxygen therapy    "3L; 24/7" (04/07/2018)   Onychomycosis    Dr. Judi Cong   Orthostatic hypotension    "since 10/2017" (04/07/2018)   PAD (peripheral artery disease) ([place])    Pneumonia    "twice in 1 year" (04/07/2018)   Pulmonary embolism ([place]) 04/07/2018   Skin cancer    "lips, face, ears, arms" (04/07/2018)   Vertigo    "since ~ 02/2018" (04/07/2018)     Social History   Tobacco Use   Smoking status: Former    Packs/day: 2.00    Years: 52.00    Pack years: 104.00    Types: Pipe, Cigarettes    Start date: 10/06/1965    Quit date: 10/06/2017    Years since quitting: 3.9   Smokeless tobacco: Never   Tobacco comments:    04/07/2018 "smoked cigarettes years ago; stopped ~ 20 yr ago; stopped smoking pipe in 09/2017"  Vaping Use   Vaping Use: Never used  Substance Use Topics   Alcohol use: Yes    Alcohol/week: 35.0 standard drinks    Types: 35 Cans of beer per week   Drug use: No    Mr.Mcgrory reports that he quit smoking about 3 years ago. His smoking use included pipe and cigarettes. He started smoking about 55 years ago. He has a 104.00 pack-year smoking history. He has never used smokeless tobacco. He reports current alcohol use of about 35.0 standard drinks per week. He reports that he does not use drugs.  Tobacco Cessation: Former smoker , quit 2019 with a 104 pack year smoking history  Past surgical hx, Family hx, Social hx all reviewed.  Current  Outpatient Medications on File Prior to Visit  Medication Sig   acetaminophen (TYLENOL) 500 MG tablet Take 1,000 mg by mouth every 6 (six) hours as needed for moderate pain.    albuterol (VENTOLIN HFA) 108 (90 Base) MCG/ACT inhaler USE 2 PUFFS EVERY 6 HOURS  AS NEEDED FOR WHEEZING   azithromycin (ZITHROMAX) 250 MG tablet TAKE 1 TABLET BY MOUTH  EVERY MONDAY, WEDNESDAY,  AND FRIDAY   Calcium Carbonate-Vitamin D (CALCIUM 600+D PO) Take 2 tablets by mouth daily.   ELIQUIS 2.5 MG TABS tablet TAKE 1 TABLET BY MOUTH  TWICE DAILY   Ferrous Sulfate (IRON) 325 (65 Fe) MG TABS Take 325 mg by mouth daily.  fluticasone (FLONASE) 50 MCG/ACT nasal spray Place 2 sprays into both nostrils daily.   folic acid (FOLVITE) 1 MG tablet Take 1 tablet (1 mg total) by mouth daily.   furosemide (LASIX) 20 MG tablet TAKE 1 TABLET (20 MG TOTAL) BY MOUTH DAILY AS NEEDED FOR FLUID OR EDEMA. (Patient taking differently: Take 20 mg by mouth daily.)   Multiple Vitamin (MULTIVITAMIN WITH MINERALS) TABS tablet Take 1 tablet by mouth daily.   mupirocin cream (BACTROBAN) 2 % Apply 1 application topically 2 (two) times daily. (Patient taking differently: Apply 1 application. topically daily as needed (Cyst).)   OXYGEN Inhale 4-5 L into the lungs continuous.    pantoprazole (PROTONIX) 20 MG tablet Take 1 tablet (20 mg total) by mouth daily.   Polyethyl Glycol-Propyl Glycol (SYSTANE) 0.4-0.3 % SOLN Place 1 drop into both eyes every other day.   predniSONE (DELTASONE) 10 MG tablet Take 4 tabs by mouth once daily x4 days, then 3 tabs x4 days, 2 tabs x4 days, 1 tab x4 days and stop. (Patient taking differently: Take 10 mg by mouth daily with breakfast.)   Respiratory Therapy Supplies (FLUTTER) DEVI 10 times Twice a day and prn as needed, may increase if feeling worse   roflumilast (DALIRESP) 500 MCG TABS tablet Take 1 tablet (500 mcg total) by mouth daily.   rosuvastatin (CRESTOR) 10 MG tablet TAKE 1 TABLET BY MOUTH  DAILY   SYMBICORT  160-4.5 MCG/ACT inhaler USE 2 INHALATIONS BY MOUTH  TWICE DAILY   Tiotropium Bromide Monohydrate (SPIRIVA RESPIMAT) 2.5 MCG/ACT AERS USE 2 INHALATIONS BY MOUTH  DAILY   traMADol (ULTRAM) 50 MG tablet Take 1 tablet (50 mg total) by mouth every 12 (twelve) hours as needed for moderate pain or severe pain.   No current facility-administered medications on file prior to visit.     Allergies  Allergen Reactions   Tape Other (See Comments)    SKIN IS VERY THIN, TEARS SKIN; CAN ONLY USE COBAN WRAPS DUE TO CONDITION OF SKIN!!   Amoxicillin-Pot Clavulanate Other (See Comments)    Thrush/ Sore throat/Voice became hoarse   Ciprofloxacin Nausea Only    Sick on stomach, weak/tired   Levaquin [Levofloxacin] Other (See Comments)    hallucinations   Lipitor [Atorvastatin] Rash    Review Of Systems:  Constitutional:   No  weight loss, night sweats,  Fevers, chills, fatigue, or  lassitude.  HEENT:   No headaches,  Difficulty swallowing,  Tooth/dental problems, or  Sore throat,                No sneezing, itching, ear ache, nasal congestion, post nasal drip,   CV:  No chest pain,  Orthopnea, PND, swelling in lower extremities, anasarca, dizziness, palpitations, syncope.   GI  No heartburn, indigestion, abdominal pain, nausea, vomiting, diarrhea, change in bowel habits, loss of appetite, bloody stools.   Resp: + shortness of breath with exertion or at rest.  + excess mucus, + productive cough,  No non-productive cough,  No coughing up of blood.  + change in color of mucus.  + wheezing.  No chest wall deformity  Skin: no rash or lesions.  GU: no dysuria, change in color of urine, no urgency or frequency.  No flank pain, no hematuria   MS:  No joint pain or swelling.  + decreased range of motion.  No back pain.  Psych:  No change in mood or affect. No depression or anxiety.  No memory loss.   Vital Signs BP  104/62 (BP Location: Left Arm, Patient Position: Sitting, Cuff Size: Normal)    Pulse (!)  106    Temp 98.6 F (37 C) (Oral)    Ht 6' (1.829 m)    Wt 185 lb (83.9 kg)    SpO2 94%    BMI 25.09 kg/m    Physical Exam:  General- No distress,  A&Ox3, pleasant ENT: No sinus tenderness, TM clear, pale nasal mucosa, no oral exudate,no post nasal drip, no LAN, HOH Cardiac: S1, S2, regular rate and rhythm, no murmur Chest: + wheeze/ rales/ dullness; no accessory muscle use, no nasal flaring, no sternal retractions, + rhonchi with diminshed breath sounds bilaterally.  Abd.: Soft Non-tender, ND, BS +, Body mass index is 25.09 kg/m.  Ext: No clubbing cyanosis, edema, no obvious abnormalities Neuro:  Physical deconditioning, MAE x4, A&O x 3, HOH Skin: No rashes, warm and dry, intact,  no lesions  Psych: normal mood and behavior   Assessment/Plan Small Cell Carcinoma of the Lung Plan Referral to Radiation oncology made for SBRT  COPD Flare Plan We will send in a prescription for Doxycycline 100 mg twice daily for your COPD flare. Take until gone. Take with a full glass of water. If in the sunshine , wear sun block, and a hat.  Please take Probiotic when on antibiotic.  I like Culturelle , take one tablet once daily.  Prednisone taper; 10 mg tablets: 4 tabs x 2 days, 3 tabs x 2 days, 2 tabs x 2 days 1 tab x 2 days then stop.   Already scheduled for Follow up with MR on 10/25/2021. Note your daily symptoms > remember "red flags" for COPD:  Increase in cough, change in color od secretions, increase in sputum production, increase in shortness of breath or activity intolerance. If you notice these symptoms, please call to be seen.   Stop azithromycin while on Doxycycline , then resume when finished. Continue Daliresp Please contact office for sooner follow up if symptoms do not improve or worsen or seek emergency care     I spent 40 minutes dedicated to the care of this patient on the date of this encounter to include pre-visit review of records, face-to-face time with the patient  discussing conditions above, post visit ordering of testing, clinical documentation with the electronic health record, making appropriate referrals as documented, and communicating necessary information to the patient's healthcare team.    Magdalen Spatz, NP 09/11/2021  2:49 PM

## 2021-09-12 ENCOUNTER — Telehealth: Payer: Self-pay | Admitting: Acute Care

## 2021-09-12 NOTE — Telephone Encounter (Signed)
I called this patient and left a message that we had tried to cancel the prescription and per [facility] they could not cancel the prescription as it was already shipped out and they could not stop the medication.  ? ? ?This patient was offered to use the local pharmacy during the office visit 09/11/21 and it was insisted by the wife that the medications go to mail order pharmacy.  ? ?I called to offer a discount card for the medications. Per [facility] the patient can pay out of pocket for around $54 for both medications. Waiting on a call back.  ?

## 2021-09-17 NOTE — Progress Notes (Incomplete)
Thoracic Location of Tumor / Histology: Right Lower Lobe Lung  Patient has been followed by pulmonary for several years due to COPD and SOB.  He was noted in January 2023 to have a new pulmonary nodule in the superior segment of the right lower lobe measuring 9 mm.   MRI Brain: Unscheduled at this time.  Bronchoscopy 09/04/2021:  PET 08/08/2021: Solitary hypermetabolic pulmonary nodule in the right lower lobe with intense hypermetabolism with an SUV up to 7.7, the lesion measured 13 mm.  No evidence of metastatic disease was identified.  CT Chest 07/10/2021: There is a new pulmonary nodule within the superior segment of right lower lobe with a mean diameter of 9 mm.  Stable scarring and masslike architectural distortion within the right apex.    Biopsies of Right Lower Lobe Lung 09/04/2021    Tobacco/Marijuana/Snuff/ETOH use: Former Smoker, quit in 2019  Past/Anticipated interventions by pulmonary, if any: Eric Form NP 09/11/2021 -I have referred him to radiation oncology. -As he is oxygen dependent and in a wheelchair, SBRT is best option for this single nodule.   Past/Anticipated interventions by cardiothoracic surgery, if any:   Past/Anticipated interventions by medical oncology, if any:    Signs/Symptoms Weight changes, if any:  Respiratory complaints, if any:  Hemoptysis, if any:  Pain issues, if any:    SAFETY ISSUES: Prior radiation?  Pacemaker/ICD?   Possible current pregnancy? N/A Is the patient on methotrexate?   Current Complaints / other details:   On Oxygen 3 Liters

## 2021-09-17 NOTE — Progress Notes (Incomplete)
Radiation Oncology         [phone] ________________________________  Name: Francisco [name]        MRN: [id number]  Date of Service: 09/18/2021 DOB: June 21, 1946  OI:[id number], Francisco Mars, MD  Garner Nash, DO     REFERRING PHYSICIAN: Garner Nash, DO   DIAGNOSIS: There were no encounter diagnoses.   HISTORY OF PRESENT ILLNESS: Francisco [name] is a 76 y.o. male seen at the request of Dr. 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  The patient has been followed for several years due to COPD and shortness of breath.  In January 2023 a new pulmonary nodule in the superior segment of the right lower lobe measuring 9 mm was noted.  Stable scarring and masslike architectural distortion within the right lung apex and diffuse bronchial wall thickening consistent with changes also of emphysema, stable thoracic compression fractures and stigmata of aortic atherosclerosis were noted.  On 08/08/2021 he underwent PET imaging that showed a solitary hypermetabolic pulmonary nodule in the right lower lobe with intense hypermetabolism with an SUV up to 7.7, the lesion measured 13 mm.  No evidence of metastatic disease was identified.  He underwent bronchoscopy on 09/04/2021 and cytology showed small cell carcinoma.  He is seeing to consider treatment for this.    PREVIOUS RADIATION THERAPY: {EXAM; YES/NO:19492::"No"}   PAST MEDICAL HISTORY:  Past Medical History:  Diagnosis Date   Chronic back pain    "mid and lower" (04/07/2018)   Chronic rhinitis    -Sinus Ct 08/01/2009 >> Bilateral maxillary sinusitis with some mucosal thickeningin the sphenoid and frontal sinuses as well with air fluid levels present -chronic rhinitis flyer Aug 04, 2009   Compressed spine fracture [facility])    COPD (chronic obstructive pulmonary disease) (HCC)    PFT's rec Jul 17, 2009   Dyspnea    Emphysema lung ([place])    On home oxygen therapy    "3L; 24/7" (04/07/2018)   Onychomycosis    Dr.  Judi Cong   Orthostatic hypotension    "since 10/2017" (04/07/2018)   PAD (peripheral artery disease) ([place])    Pneumonia    "twice in 1 year" (04/07/2018)   Pulmonary embolism ([place]) 04/07/2018   Skin cancer    "lips, face, ears, arms" (04/07/2018)   Vertigo    "since ~ 02/2018" (04/07/2018)       PAST SURGICAL HISTORY: Past Surgical History:  Procedure Laterality Date   ABDOMINAL AORTOGRAM W/LOWER EXTREMITY N/A 01/21/2020   Procedure: ABDOMINAL AORTOGRAM W/LOWER EXTREMITY;  Surgeon: Elam Dutch, MD;  Location: [place] [facility];  Service: Cardiovascular;  Laterality: N/A;   APPENDECTOMY     BIOPSY  02/20/2020   Procedure: BIOPSY;  Surgeon: Rush Landmark Telford Nab., MD;  Location: [place];  Service: Gastroenterology;;   BRONCHIAL BIOPSY  09/04/2021   Procedure: BRONCHIAL BIOPSIES;  Surgeon: Garner Nash, DO;  Location: [place] [facility];  Service: Pulmonary;;   BRONCHIAL BRUSHINGS  09/04/2021   Procedure: BRONCHIAL BRUSHINGS;  Surgeon: Garner Nash, DO;  Location: [place];  Service: Pulmonary;;   BRONCHIAL NEEDLE ASPIRATION BIOPSY  09/04/2021   Procedure: BRONCHIAL NEEDLE ASPIRATION BIOPSIES;  Surgeon: Garner Nash, DO;  Location: [place];  Service: Pulmonary;;   CATARACT EXTRACTION, BILATERAL     ENDARTERECTOMY FEMORAL Right 04/19/2019   Procedure: ENDARTERECTOMY RIGHT COMMON FEMORAL;  Surgeon: Elam Dutch, MD;  Location: [place];  Service: Vascular;  Laterality: Right;  ENDARTERECTOMY FEMORAL Left 01/24/2020   Procedure: LEFT FEMORAL ENDARTERECTOMY WITH DACRON PATCH ANGIOPLASTY;  Surgeon: Elam Dutch, MD;  Location: [facility] OR;  Service: Vascular;  Laterality: Left;   ESOPHAGOGASTRODUODENOSCOPY (EGD) WITH PROPOFOL N/A 02/20/2020   Procedure: ESOPHAGOGASTRODUODENOSCOPY (EGD) WITH PROPOFOL;  Surgeon: Irving Copas., MD;  Location: [place];  Service: Gastroenterology;  Laterality: N/A;   FEMORAL ENDARTERECTOMY Left 01/24/2020   FIDUCIAL MARKER  PLACEMENT  09/04/2021   Procedure: FIDUCIAL MARKER PLACEMENT;  Surgeon: Garner Nash, DO;  Location: [place];  Service: Pulmonary;;   HEMOSTASIS CLIP PLACEMENT  02/20/2020   Procedure: HEMOSTASIS CLIP PLACEMENT;  Surgeon: Irving Copas., MD;  Location: [place];  Service: Gastroenterology;;   HOT HEMOSTASIS N/A 02/20/2020   Procedure: HOT HEMOSTASIS (ARGON PLASMA COAGULATION/BICAP);  Surgeon: Irving Copas., MD;  Location: [place];  Service: Gastroenterology;  Laterality: N/A;   INSERTION OF ILIAC STENT Left 01/24/2020   Procedure: INSERTION OF VBX STENT 8X59 AND 8X39 LEFT COMMON ILIAC ARTERY. INSERTION OF INNOVA 7 X 60 INNOVA STENT LEFT EXTERNAL ILIAC ARTERY. ;  Surgeon: Elam Dutch, MD;  Location: [place];  Service: Vascular;  Laterality: Left;   LOWER EXTREMITY ANGIOGRAPHY  12/11/2018   LOWER EXTREMITY ANGIOGRAPHY N/A 12/11/2018   Procedure: LOWER EXTREMITY ANGIOGRAPHY;  Surgeon: Elam Dutch, MD;  Location: [place] [facility];  Service: Cardiovascular;  Laterality: N/A;   PATCH ANGIOPLASTY Right 04/19/2019   Procedure: Patch Angioplasty;  Surgeon: Elam Dutch, MD;  Location: [facility] OR;  Service: Vascular;  Laterality: Right;   PERIPHERAL VASCULAR INTERVENTION Right 12/11/2018   Procedure: PERIPHERAL VASCULAR INTERVENTION;  Surgeon: Elam Dutch, MD;  Location: [place] [facility];  Service: Cardiovascular;  Laterality: Right;  Common Iliac    SKIN CANCER EXCISION     "lips, face, ears, arms" (04/07/2018)   TONSILLECTOMY     ULTRASOUND GUIDANCE FOR VASCULAR ACCESS Right 01/24/2020   Procedure: ULTRASOUND GUIDANCE FOR VASCULAR ACCESS;  Surgeon: Elam Dutch, MD;  Location: [facility] OR;  Service: Vascular;  Laterality: Right;   VIDEO BRONCHOSCOPY WITH RADIAL ENDOBRONCHIAL ULTRASOUND  09/04/2021   Procedure: VIDEO BRONCHOSCOPY WITH RADIAL ENDOBRONCHIAL ULTRASOUND;  Surgeon: Garner Nash, DO;  Location: [facility];  Service: Pulmonary;;     FAMILY  HISTORY:  Family History  Problem Relation Age of Onset   Heart disease Mother        CABG in her 53s, nonsmoker   Cancer Mother    Stroke Father    Heart disease Father        Died of MI at age 26, smoker   Hepatitis Sister    Coronary artery disease Other        male 1st degree relative <60   Colon cancer Neg Hx    Pancreatic cancer Neg Hx    Esophageal cancer Neg Hx    Inflammatory bowel disease Neg Hx    Liver disease Neg Hx    Rectal cancer Neg Hx    Stomach cancer Neg Hx      SOCIAL HISTORY:  reports that he quit smoking about 3 years ago. His smoking use included pipe and cigarettes. He started smoking about 55 years ago. He has a 104.00 pack-year smoking history. He has never used smokeless tobacco. He reports current alcohol use of about 35.0 standard drinks per week. He reports that he does not use drugs.   ALLERGIES: Tape, Amoxicillin-pot clavulanate, Ciprofloxacin, Levaquin [levofloxacin], and Lipitor [atorvastatin]   MEDICATIONS:  Current  Outpatient Medications  Medication Sig Dispense Refill   acetaminophen (TYLENOL) 500 MG tablet Take 1,000 mg by mouth every 6 (six) hours as needed for moderate pain.      albuterol (VENTOLIN HFA) 108 (90 Base) MCG/ACT inhaler USE 2 PUFFS EVERY 6 HOURS  AS NEEDED FOR WHEEZING 18 g 3   azithromycin (ZITHROMAX) 250 MG tablet TAKE 1 TABLET BY MOUTH  EVERY MONDAY, WEDNESDAY,  AND FRIDAY 36 tablet 3   Calcium Carbonate-Vitamin D (CALCIUM 600+D PO) Take 2 tablets by mouth daily.     doxycycline (VIBRA-TABS) 100 MG tablet Take 1 tablet (100 mg total) by mouth 2 (two) times daily. 28 tablet 0   ELIQUIS 2.5 MG TABS tablet TAKE 1 TABLET BY MOUTH  TWICE DAILY 180 tablet 3   Ferrous Sulfate (IRON) 325 (65 Fe) MG TABS Take 325 mg by mouth daily.     fluticasone (FLONASE) 50 MCG/ACT nasal spray Place 2 sprays into both nostrils daily. 48 g 3   folic acid (FOLVITE) 1 MG tablet Take 1 tablet (1 mg total) by mouth daily.     furosemide (LASIX)  20 MG tablet TAKE 1 TABLET (20 MG TOTAL) BY MOUTH DAILY AS NEEDED FOR FLUID OR EDEMA. (Patient taking differently: Take 20 mg by mouth daily.) 90 tablet 3   Multiple Vitamin (MULTIVITAMIN WITH MINERALS) TABS tablet Take 1 tablet by mouth daily.     mupirocin cream (BACTROBAN) 2 % Apply 1 application topically 2 (two) times daily. (Patient taking differently: Apply 1 application. topically daily as needed (Cyst).) 22 g 0   OXYGEN Inhale 4-5 L into the lungs continuous.      pantoprazole (PROTONIX) 20 MG tablet Take 1 tablet (20 mg total) by mouth daily. 90 tablet 1   Polyethyl Glycol-Propyl Glycol (SYSTANE) 0.4-0.3 % SOLN Place 1 drop into both eyes every other day.     predniSONE (DELTASONE) 10 MG tablet Take 4 tabs by mouth once daily x4 days, then 3 tabs x4 days, 2 tabs x4 days, 1 tab x4 days and stop. (Patient taking differently: Take 10 mg by mouth daily with breakfast.) 40 tablet 0   predniSONE (DELTASONE) 10 MG tablet Prednisone taper; 10 mg tablets: 4 tabs x 2 days, 3 tabs x 2 days, 2 tabs x 2 days 1 tab x 2 days then stop. 20 tablet 0   Respiratory Therapy Supplies (FLUTTER) DEVI 10 times Twice a day and prn as needed, may increase if feeling worse 1 each 0   roflumilast (DALIRESP) 500 MCG TABS tablet Take 1 tablet (500 mcg total) by mouth daily. 90 tablet 0   rosuvastatin (CRESTOR) 10 MG tablet TAKE 1 TABLET BY MOUTH  DAILY 90 tablet 3   SYMBICORT 160-4.5 MCG/ACT inhaler USE 2 INHALATIONS BY MOUTH  TWICE DAILY 30.6 g 3   Tiotropium Bromide Monohydrate (SPIRIVA RESPIMAT) 2.5 MCG/ACT AERS USE 2 INHALATIONS BY MOUTH  DAILY 12 g 3   traMADol (ULTRAM) 50 MG tablet Take 1 tablet (50 mg total) by mouth every 12 (twelve) hours as needed for moderate pain or severe pain. 90 tablet 1   No current facility-administered medications for this visit.     REVIEW OF SYSTEMS: On review of systems, the patient reports that *** is doing well overall. *** denies any chest pain, shortness of breath, cough,  fevers, chills, night sweats, unintended weight changes. *** denies any bowel or bladder disturbances, and denies abdominal pain, nausea or vomiting. *** denies any new musculoskeletal or joint aches or  pains. A complete review of systems is obtained and is otherwise negative.     PHYSICAL EXAM:  Wt Readings from Last 3 Encounters:  09/11/21 185 lb (83.9 kg)  09/04/21 180 lb (81.6 kg)  08/09/21 185 lb (83.9 kg)   Temp Readings from Last 3 Encounters:  09/11/21 98.6 F (37 C) (Oral)  09/04/21 98.4 F (36.9 C)  08/09/21 98.4 F (36.9 C) (Oral)   BP Readings from Last 3 Encounters:  09/11/21 104/62  09/04/21 127/81  08/09/21 130/64   Pulse Readings from Last 3 Encounters:  09/11/21 (!) 106  09/04/21 (!) 108  08/09/21 (!) 110    /10  In general this is a well appearing *** in no acute distress. ***'s alert and oriented x4 and appropriate throughout the examination. Cardiopulmonary assessment is negative for acute distress and *** exhibits normal effort.     ECOG = ***  0 - Asymptomatic (Fully active, able to carry on all predisease activities without restriction)  1 - Symptomatic but completely ambulatory (Restricted in physically strenuous activity but ambulatory and able to carry out work of a light or sedentary nature. For example, light housework, office work)  2 - Symptomatic, <50% in bed during the day (Ambulatory and capable of all self care but unable to carry out any work activities. Up and about more than 50% of waking hours)  3 - Symptomatic, >50% in bed, but not bedbound (Capable of only limited self-care, confined to bed or chair 50% or more of waking hours)  4 - Bedbound (Completely disabled. Cannot carry on any self-care. Totally confined to bed or chair)  5 - Death   Eustace Pen MM, Creech RH, Tormey DC, et al. [phone]). "Toxicity and response criteria of the [facility]". [place] Oncol. 5 (6): 649-55    LABORATORY DATA:  Lab Results   Component Value Date   WBC 11.8 (H) 09/04/2021   HGB 12.4 (L) 09/04/2021   HCT 36.4 (L) 09/04/2021   MCV 97.1 09/04/2021   PLT 152 09/04/2021   Lab Results  Component Value Date   NA 140 12/07/2020   K 4.8 12/07/2020   CL 98 12/07/2020   CO2 27 12/07/2020   Lab Results  Component Value Date   ALT 20 12/07/2020   AST 27 12/07/2020   ALKPHOS 55 12/07/2020   BILITOT 0.6 12/07/2020      RADIOGRAPHY: DG Chest Port 1 View  Result Date: 09/04/2021 CLINICAL DATA:  Status post bronchoscopy EXAM: PORTABLE CHEST 1 VIEW COMPARISON:  Chest x-ray dated February 18, 2020 FINDINGS: Cardiac and mediastinal contours within normal limits. Unchanged scarring of the right lung apex. Mild right lower lobe airspace opacity, likely post bronchoscopy changes. No evidence of effusion or pneumothorax. IMPRESSION: No evidence of pneumothorax. Electronically Signed   By: Yetta Glassman M.D.   On: 09/04/2021 09:03   DG C-ARM BRONCHOSCOPY  Result Date: 09/04/2021 C-ARM BRONCHOSCOPY: Fluoroscopy was utilized by the requesting physician.  No radiographic interpretation.       IMPRESSION/PLAN: 1. Limited Stage , [id number], Small cell carcinoma of the RLL. Dr. Lisbeth Renshaw discusses the pathology findings and reviews the nature of ***. We recommend complete staging with MRI brain and referral to medical oncology. Dr. Lisbeth Renshaw recommends *** We discussed the risks, benefits, short, and long term effects of radiotherapy, as well as the curative intent, and the patient is interested in proceeding. Dr. Lisbeth Renshaw discusses the delivery and logistics of radiotherapy and anticipates a course of *** weeks  of radiotherapy. We will see her back a few weeks after surgery to discuss the simulation process and anticipate we starting radiotherapy about 4-6 weeks after surgery.  2. PCI. ***  In a visit lasting *** minutes, greater than 50% of the time was spent face to face discussing the patient's condition, in preparation for the  discussion, and coordinating the patient's care.    The above documentation reflects my direct findings during this shared patient visit. Please see the separate note by Dr. Lisbeth Renshaw on this date for the remainder of the patient's plan of care.    Carola Rhine, [facility]   **Disclaimer: This note was dictated with voice recognition software. Similar sounding words can inadvertently be transcribed and this note may contain transcription errors which may not have been corrected upon publication of note.**

## 2021-09-18 ENCOUNTER — Ambulatory Visit
Admission: RE | Admit: 2021-09-18 | Discharge: 2021-09-18 | Disposition: A | Discharge status: Home / Self Care | Payer: Medicare Other | Source: Ambulatory Visit | Attending: Radiation Oncology | Admitting: Radiation Oncology

## 2021-09-18 ENCOUNTER — Encounter: Payer: Self-pay | Admitting: Radiation Oncology

## 2021-09-18 ENCOUNTER — Other Ambulatory Visit: Payer: Self-pay

## 2021-09-18 ENCOUNTER — Telehealth: Payer: Self-pay | Admitting: Internal Medicine

## 2021-09-18 VITALS — BP 136/80 | HR 107 | Temp 96.9°F | Resp 20 | Ht 72.0 in | Wt 183.0 lb

## 2021-09-18 DIAGNOSIS — J449 Chronic obstructive pulmonary disease, unspecified: Secondary | ICD-10-CM | POA: Insufficient documentation

## 2021-09-18 DIAGNOSIS — J439 Emphysema, unspecified: Secondary | ICD-10-CM | POA: Insufficient documentation

## 2021-09-18 DIAGNOSIS — I739 Peripheral vascular disease, unspecified: Secondary | ICD-10-CM | POA: Insufficient documentation

## 2021-09-18 DIAGNOSIS — Z7901 Long term (current) use of anticoagulants: Secondary | ICD-10-CM | POA: Diagnosis not present

## 2021-09-18 DIAGNOSIS — C3431 Malignant neoplasm of lower lobe, right bronchus or lung: Secondary | ICD-10-CM | POA: Insufficient documentation

## 2021-09-18 DIAGNOSIS — Z79899 Other long term (current) drug therapy: Secondary | ICD-10-CM | POA: Insufficient documentation

## 2021-09-18 DIAGNOSIS — Z86718 Personal history of other venous thrombosis and embolism: Secondary | ICD-10-CM | POA: Insufficient documentation

## 2021-09-18 DIAGNOSIS — Z85828 Personal history of other malignant neoplasm of skin: Secondary | ICD-10-CM | POA: Diagnosis not present

## 2021-09-18 DIAGNOSIS — C3490 Malignant neoplasm of unspecified part of unspecified bronchus or lung: Secondary | ICD-10-CM

## 2021-09-18 DIAGNOSIS — Z87891 Personal history of nicotine dependence: Secondary | ICD-10-CM | POA: Insufficient documentation

## 2021-09-18 NOTE — Telephone Encounter (Signed)
Scheduled appt per 3/14 referral. Pt is aware of appt date and time. Pt is aware to arrive 15 mins prior to appt time and to bring and updated insurance card. Pt is aware of appt location.   ?

## 2021-09-19 ENCOUNTER — Other Ambulatory Visit: Payer: Self-pay | Admitting: *Deleted

## 2021-09-19 NOTE — Progress Notes (Signed)
The proposed treatment discussed in cancer conference 3/9 is for discussion purpose only and is not a binding recommendation.  The patient was not physically examined nor present for their treatment options. Therefore, final treatment plans cannot be decided.  ?

## 2021-09-20 ENCOUNTER — Inpatient Hospital Stay: Payer: Medicare Other | Attending: Internal Medicine | Admitting: Internal Medicine

## 2021-09-20 ENCOUNTER — Other Ambulatory Visit: Payer: Self-pay

## 2021-09-20 VITALS — BP 125/69 | HR 102 | Temp 97.6°F | Resp 18 | Ht 72.0 in | Wt 183.0 lb

## 2021-09-20 DIAGNOSIS — C3490 Malignant neoplasm of unspecified part of unspecified bronchus or lung: Secondary | ICD-10-CM | POA: Diagnosis not present

## 2021-09-20 NOTE — Telephone Encounter (Signed)
Sarah, please advise on pt's email about changing abx. Thanks.  ?

## 2021-09-20 NOTE — Progress Notes (Signed)
? ? [place] ?Telephone:([phone]   [phone] ? ?CONSULT NOTE ? ?REFERRING PHYSICIAN: Dr. Leory Plowman Icard ? ?REASON FOR CONSULTATION:  ?76 years old white male recently diagnosed with lung cancer ? ?HPI ?Francisco [name] is a 76 y.o. male with past medical history significant for multiple medical problems including chronic back pain, COPD, peripheral arterial disease, pulmonary embolism as well as history of iron deficiency secondary to gastrointestinal hemorrhage and long history of smoking.  The patient was followed closely by [name] for his COPD and CT scan of the chest performed on August 06, 2021 without contrast showed new pulmonary nodule in the superior segment of the right lower lobe measuring 0.9 cm.  The patient had a PET scan on August 08, 2021 and it showed solitary hypermetabolic right lower lobe lung nodule with no evidence of mediastinal, supraclavicular, axillary or distant metastatic disease. ?The patient was seen by Dr. Valeta Harms and he had flexible video fiberoptic bronchoscopy with robotic assistance and biopsies on September 04, 2021.  The final pathology ([id number]) showed malignant cells consistent with small cell carcinoma. ?The patient was referred to me today for evaluation and recommendation regarding his condition. ?When seen today he continues to complain of the baseline shortness of breath and he is currently on home oxygen 4 L/min nasal cannula.  He also has chest congestion and cough productive of greenish-yellow sputum and he is currently on treatment with antibiotics by his pulmonologist.  He has no chest pain or hemoptysis.  He denied having any nausea, vomiting but has few episodes of diarrhea after starting the antibiotics with no constipation or abdominal pain.  He has no headache or visual changes.  He has no recent weight loss or night sweats. ?Family history significant for mother with uterine cancer and heart disease.  Father had heart  disease.  Sister had breast cancer and heart disease. ?The patient is married and has 2 children.  He used to work in Biomedical [employer].  He has a history of smoking more than 1 pack/day for over 50 years.  He also drinks 4-5 beers every day.  He has no history of drug abuse. ? ?HPI ? ?Past Medical History:  ?Diagnosis Date  ? Chronic back pain   ? "mid and lower" (04/07/2018)  ? Chronic rhinitis   ? -Sinus Ct 08/01/2009 >> Bilateral maxillary sinusitis with some mucosal thickeningin the sphenoid and frontal sinuses as well with air fluid levels present -chronic rhinitis flyer Aug 04, 2009  ? Compressed spine fracture ([place])   ? COPD (chronic obstructive pulmonary disease) ([place])   ? PFT's rec Jul 17, 2009  ? Dyspnea   ? Emphysema lung ([place])   ? On home oxygen therapy   ? "3L; 24/7" (04/07/2018)  ? Onychomycosis   ? Dr. Judi Cong  ? Orthostatic hypotension   ? "since 10/2017" (04/07/2018)  ? PAD (peripheral artery disease) ([place])   ? Pneumonia   ? "twice in 1 year" (04/07/2018)  ? Pulmonary embolism ([place]) 04/07/2018  ? Skin cancer   ? "lips, face, ears, arms" (04/07/2018)  ? Vertigo   ? "since ~ 02/2018" (04/07/2018)  ? ? ?Past Surgical History:  ?Procedure Laterality Date  ? ABDOMINAL AORTOGRAM W/LOWER EXTREMITY N/A 01/21/2020  ? Procedure: ABDOMINAL AORTOGRAM W/LOWER EXTREMITY;  Surgeon: Elam Dutch, MD;  Location: [place] [facility];  Service: Cardiovascular;  Laterality: N/A;  ? APPENDECTOMY    ? BIOPSY  02/20/2020  ? Procedure: BIOPSY;  Surgeon:  Mansouraty, Telford Nab., MD;  Location: [place];  Service: Gastroenterology;;  ? BRONCHIAL BIOPSY  09/04/2021  ? Procedure: BRONCHIAL BIOPSIES;  Surgeon: Garner Nash, DO;  Location: [place] [facility];  Service: Pulmonary;;  ? BRONCHIAL BRUSHINGS  09/04/2021  ? Procedure: BRONCHIAL BRUSHINGS;  Surgeon: Garner Nash, DO;  Location: [place];  Service: Pulmonary;;  ? BRONCHIAL NEEDLE ASPIRATION BIOPSY  09/04/2021  ? Procedure: BRONCHIAL NEEDLE ASPIRATION BIOPSIES;   Surgeon: Garner Nash, DO;  Location: [place];  Service: Pulmonary;;  ? CATARACT EXTRACTION, BILATERAL    ? ENDARTERECTOMY FEMORAL Right 04/19/2019  ? Procedure: ENDARTERECTOMY RIGHT COMMON FEMORAL;  Surgeon: Elam Dutch, MD;  Location: [facility] OR;  Service: Vascular;  Laterality: Right;  ? ENDARTERECTOMY FEMORAL Left 01/24/2020  ? Procedure: LEFT FEMORAL ENDARTERECTOMY WITH DACRON PATCH ANGIOPLASTY;  Surgeon: Elam Dutch, MD;  Location: [facility] OR;  Service: Vascular;  Laterality: Left;  ? ESOPHAGOGASTRODUODENOSCOPY (EGD) WITH PROPOFOL N/A 02/20/2020  ? Procedure: ESOPHAGOGASTRODUODENOSCOPY (EGD) WITH PROPOFOL;  Surgeon: Rush Landmark Telford Nab., MD;  Location: [place];  Service: Gastroenterology;  Laterality: N/A;  ? FEMORAL ENDARTERECTOMY Left 01/24/2020  ? FIDUCIAL MARKER PLACEMENT  09/04/2021  ? Procedure: FIDUCIAL MARKER PLACEMENT;  Surgeon: Garner Nash, DO;  Location: [place];  Service: Pulmonary;;  ? HEMOSTASIS CLIP PLACEMENT  02/20/2020  ? Procedure: HEMOSTASIS CLIP PLACEMENT;  Surgeon: Irving Copas., MD;  Location: [place];  Service: Gastroenterology;;  ? HOT HEMOSTASIS N/A 02/20/2020  ? Procedure: HOT HEMOSTASIS (ARGON PLASMA COAGULATION/BICAP);  Surgeon: Irving Copas., MD;  Location: [place];  Service: Gastroenterology;  Laterality: N/A;  ? INSERTION OF ILIAC STENT Left 01/24/2020  ? Procedure: INSERTION OF VBX STENT [id number] AND 8X39 LEFT COMMON ILIAC ARTERY. INSERTION OF INNOVA 7 X 60 INNOVA STENT LEFT EXTERNAL ILIAC ARTERY. ;  Surgeon: Elam Dutch, MD;  Location: [place];  Service: Vascular;  Laterality: Left;  ? LOWER EXTREMITY ANGIOGRAPHY  12/11/2018  ? LOWER EXTREMITY ANGIOGRAPHY N/A 12/11/2018  ? Procedure: LOWER EXTREMITY ANGIOGRAPHY;  Surgeon: Elam Dutch, MD;  Location: [place] [facility];  Service: Cardiovascular;  Laterality: N/A;  ? PATCH ANGIOPLASTY Right 04/19/2019  ? Procedure: Patch Angioplasty;  Surgeon: Elam Dutch, MD;   Location: [facility] OR;  Service: Vascular;  Laterality: Right;  ? PERIPHERAL VASCULAR INTERVENTION Right 12/11/2018  ? Procedure: PERIPHERAL VASCULAR INTERVENTION;  Surgeon: Elam Dutch, MD;  Location: [place] [facility];  Service: Cardiovascular;  Laterality: Right;  Common Iliac   ? SKIN CANCER EXCISION    ? "lips, face, ears, arms" (04/07/2018)  ? TONSILLECTOMY    ? ULTRASOUND GUIDANCE FOR VASCULAR ACCESS Right 01/24/2020  ? Procedure: ULTRASOUND GUIDANCE FOR VASCULAR ACCESS;  Surgeon: Elam Dutch, MD;  Location: [place];  Service: Vascular;  Laterality: Right;  ? VIDEO BRONCHOSCOPY WITH RADIAL ENDOBRONCHIAL ULTRASOUND  09/04/2021  ? Procedure: VIDEO BRONCHOSCOPY WITH RADIAL ENDOBRONCHIAL ULTRASOUND;  Surgeon: Garner Nash, DO;  Location: [place] [facility];  Service: Pulmonary;;  ? ? ?Family History  ?Problem Relation Age of Onset  ? Heart disease Mother   ?     CABG in her 29s, nonsmoker  ? Cancer Mother   ? Stroke Father   ? Heart disease Father   ?     Died of MI at age 35, smoker  ? Hepatitis Sister   ? Coronary artery disease Other   ?     male 1st degree relative <60  ? Colon cancer Neg Hx   ?  Pancreatic cancer Neg Hx   ? Esophageal cancer Neg Hx   ? Inflammatory bowel disease Neg Hx   ? Liver disease Neg Hx   ? Rectal cancer Neg Hx   ? Stomach cancer Neg Hx   ? ? ?Social History ?Social History  ? ?Tobacco Use  ? Smoking status: Former  ?  Packs/day: 2.00  ?  Years: 52.00  ?  Pack years: 104.00  ?  Types: Pipe, Cigarettes  ?  Start date: 10/06/1965  ?  Quit date: 10/06/2017  ?  Years since quitting: 3.9  ? Smokeless tobacco: Never  ? Tobacco comments:  ?  04/07/2018 "smoked cigarettes years ago; stopped ~ 20 yr ago; stopped smoking pipe in 09/2017"  ?Vaping Use  ? Vaping Use: Never used  ?Substance Use Topics  ? Alcohol use: Yes  ?  Alcohol/week: 35.0 standard drinks  ?  Types: 35 Cans of beer per week  ? Drug use: No  ? ? ?Allergies  ?Allergen Reactions  ? Tape Other (See Comments)  ?  SKIN IS VERY THIN, TEARS  SKIN; CAN ONLY USE COBAN WRAPS DUE TO CONDITION OF SKIN!!  ? Amoxicillin-Pot Clavulanate Other (See Comments)  ?  Thrush/ Sore throat/Voice became hoarse  ? Ciprofloxacin Nausea Only  ?  Sick on stomach, wea

## 2021-09-21 ENCOUNTER — Ambulatory Visit
Admission: RE | Admit: 2021-09-21 | Discharge: 2021-09-21 | Disposition: A | Discharge status: Home / Self Care | Payer: Medicare Other | Source: Ambulatory Visit | Attending: Radiation Oncology | Admitting: Radiation Oncology

## 2021-09-21 ENCOUNTER — Other Ambulatory Visit: Payer: Self-pay | Admitting: Acute Care

## 2021-09-21 DIAGNOSIS — C3431 Malignant neoplasm of lower lobe, right bronchus or lung: Secondary | ICD-10-CM | POA: Diagnosis not present

## 2021-09-21 DIAGNOSIS — J441 Chronic obstructive pulmonary disease with (acute) exacerbation: Secondary | ICD-10-CM

## 2021-09-21 MED ORDER — CEFDINIR 300 MG PO CAPS: 300.0000 mg | ORAL_CAPSULE | Freq: Two times a day (BID) | ORAL | 0 refills | Status: DC

## 2021-09-21 NOTE — Progress Notes (Signed)
I have called the patient. He has a cross over sensitivity listed to [facility]. He states he took it without incident 05/2021 and would like to take it again as it works, and he had no problems with it. I instructed patient to stop taking Doxycycline, and start Omnicef 300 mg BID x 10 days. I have asked him to continue probiotic as he was instructed to do in the office on 3.7.2023. He knows to call the office with any further issues or to seek emergency care if the office is closed. He verbalized understanding.  ?

## 2021-09-25 ENCOUNTER — Telehealth: Payer: Self-pay | Admitting: *Deleted

## 2021-09-25 ENCOUNTER — Encounter: Payer: Self-pay | Admitting: Radiation Oncology

## 2021-09-25 NOTE — Telephone Encounter (Signed)
Returned call to the patient.  He reports since coming in on Friday for his simulation appointment he has been having increased back pain.  He reports he has back pain at baseline that occasionally flares up.  He is wondering if he should be seen by his orthopedic doctor prior to starting radiation treatments next week.  He was informed that his increased pain is likely due to lying on the table to have his planning done and if he feels he would not be able to lay still on the table on his treatment days due to his pain he should reach out to his orthopedic or primary care physician.  He states he is not sure if he could lay still long enough to get treatment.  He will reach out to his other doctors and call me back on Friday to let me know if he wants to proceed with starting radiation on Tuesday next week. ? ?Gloriajean Dell. Leonie Green, BSN  ?

## 2021-09-25 NOTE — Telephone Encounter (Signed)
Called patient to inform of MRI for 10-03-21- arrival time- 12:30 pm @ [facility], no restrictions to test, spoke with patient and he is aware of this test ?

## 2021-10-01 ENCOUNTER — Other Ambulatory Visit: Payer: Self-pay | Admitting: Internal Medicine

## 2021-10-01 DIAGNOSIS — C3431 Malignant neoplasm of lower lobe, right bronchus or lung: Secondary | ICD-10-CM | POA: Diagnosis not present

## 2021-10-02 ENCOUNTER — Ambulatory Visit: Admission: RE | Admit: 2021-10-02 | Payer: Medicare Other | Source: Ambulatory Visit | Admitting: Radiation Oncology

## 2021-10-02 ENCOUNTER — Other Ambulatory Visit: Payer: Self-pay

## 2021-10-02 ENCOUNTER — Other Ambulatory Visit: Payer: Self-pay | Admitting: Radiation Oncology

## 2021-10-02 MED ORDER — HYDROCODONE-ACETAMINOPHEN 5-325 MG PO TABS: 1.0000 | ORAL_TABLET | Freq: Four times a day (QID) | ORAL | 0 refills | Status: DC | PRN

## 2021-10-03 ENCOUNTER — Telehealth: Payer: Self-pay | Admitting: Radiation Oncology

## 2021-10-03 ENCOUNTER — Encounter (HOSPITAL_COMMUNITY): Payer: Self-pay

## 2021-10-03 ENCOUNTER — Ambulatory Visit (HOSPITAL_COMMUNITY): Payer: Medicare Other

## 2021-10-03 ENCOUNTER — Other Ambulatory Visit: Payer: Self-pay | Admitting: Radiation Oncology

## 2021-10-03 ENCOUNTER — Ambulatory Visit: Payer: Medicare Other | Admitting: Radiation Oncology

## 2021-10-03 NOTE — Telephone Encounter (Signed)
Called to cancel 3/30 treatment due to orthopedic appointments delaying radiation treatments. L1 notified.  ?

## 2021-10-04 ENCOUNTER — Ambulatory Visit: Payer: Medicare Other | Admitting: Radiation Oncology

## 2021-10-08 ENCOUNTER — Telehealth: Payer: Self-pay | Admitting: Radiation Oncology

## 2021-10-08 NOTE — Telephone Encounter (Signed)
I spoke with Dr. [name] last week and reviewed the patient's treatment plan. He has thoracic spine compression fractures and is being treated with pain medication. I explained to his wife we would not like to delay his treatment any further and that we would recommend trying to work with Dr. [name] to see how his pain management could be maximized to avoid further delays in his treatment for an aggressive tumor that currently is early stage. She will call us back but he is scheduled otherwise to start treatment tomorrow.  ?

## 2021-10-09 ENCOUNTER — Ambulatory Visit: Payer: Medicare Other | Admitting: Radiation Oncology

## 2021-10-11 ENCOUNTER — Ambulatory Visit: Payer: Medicare Other | Admitting: Radiation Oncology

## 2021-10-16 ENCOUNTER — Other Ambulatory Visit: Payer: Self-pay

## 2021-10-16 ENCOUNTER — Ambulatory Visit
Admission: RE | Admit: 2021-10-16 | Discharge: 2021-10-16 | Disposition: A | Discharge status: Home / Self Care | Payer: Medicare Other | Source: Ambulatory Visit | Attending: Radiation Oncology | Admitting: Radiation Oncology

## 2021-10-16 DIAGNOSIS — C3431 Malignant neoplasm of lower lobe, right bronchus or lung: Secondary | ICD-10-CM | POA: Insufficient documentation

## 2021-10-17 ENCOUNTER — Ambulatory Visit (HOSPITAL_COMMUNITY)
Admission: RE | Admit: 2021-10-17 | Discharge: 2021-10-17 | Disposition: A | Discharge status: Home / Self Care | Payer: Medicare Other | Source: Ambulatory Visit | Attending: Radiation Oncology | Admitting: Radiation Oncology

## 2021-10-17 DIAGNOSIS — C3431 Malignant neoplasm of lower lobe, right bronchus or lung: Secondary | ICD-10-CM | POA: Insufficient documentation

## 2021-10-17 IMAGING — MR MR HEAD WO/W CM
11 of 13 series · 34 of 48 positions shown · IV contrast (gadavist)
Comparison: PET-CT [DATE].  Head CT [DATE].

CLINICAL DATA: 76-year-old male with recent lung cancer diagnosis.
Staging.

EXAM:
MRI HEAD WITHOUT AND WITH CONTRAST
TECHNIQUE: Multiplanar, multiecho pulse sequences of the brain and surrounding
structures were obtained without and with intravenous contrast.
CONTRAST:  8mL GADAVIST GADOBUTROL 1 MMOL/ML IV SOLN

[Series 7: T2 · sagittal · 5.0mm · 0.47mm/px · 2 of 24 slices shown (1 of 2)]
[im 1/24]
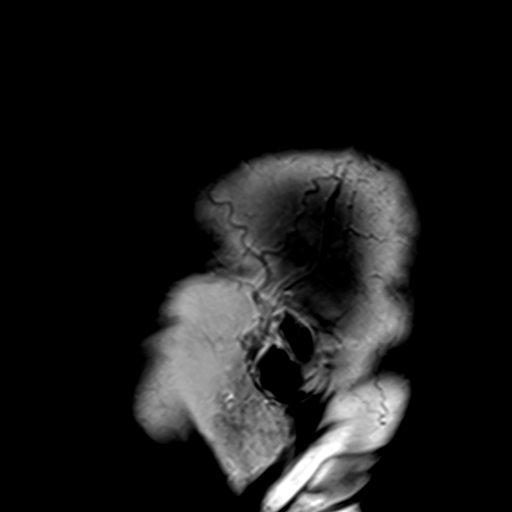
[im 24/24]
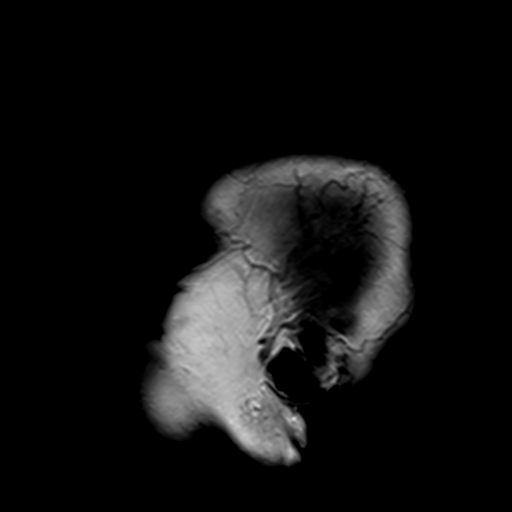

[Series 8: T2 · axial · 5.0mm · 0.45mm/px · z∈[-68,+88]mm · 2 of 26 slices shown (2 of 2)]
[im 1/26]
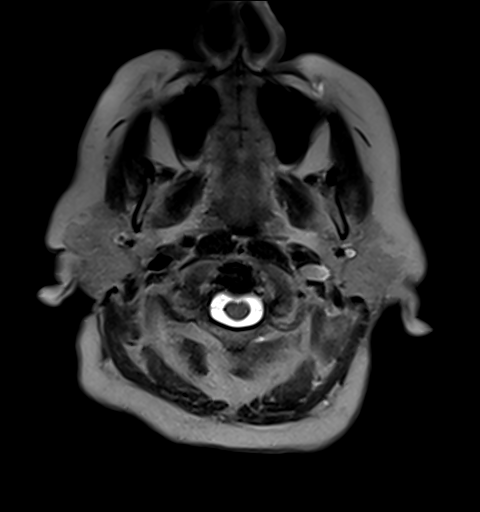
[im 26/26]
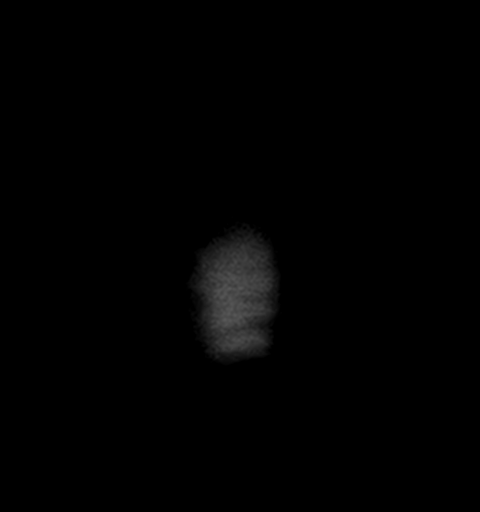

[Series 9: GRE · axial · 3.0mm · 0.45mm/px · z∈[-63,+84]mm · 4 of 52 slices shown]
[im 1/52]
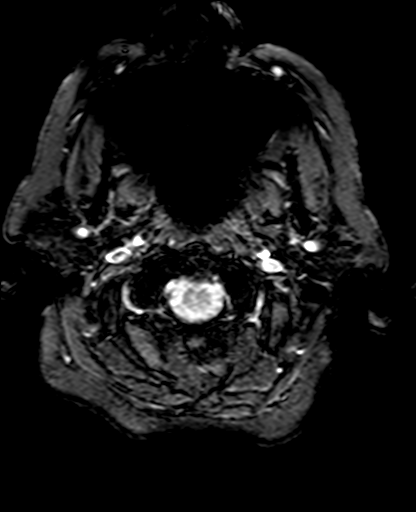
[im 18/52]
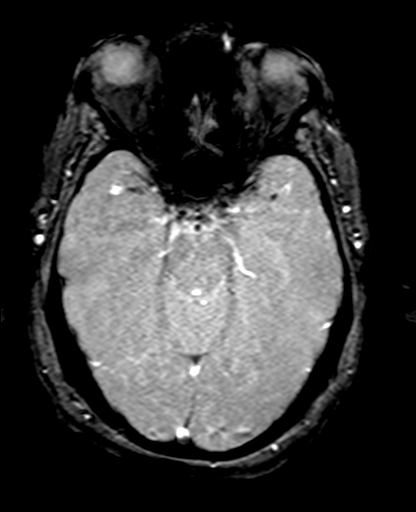
[im 35/52]
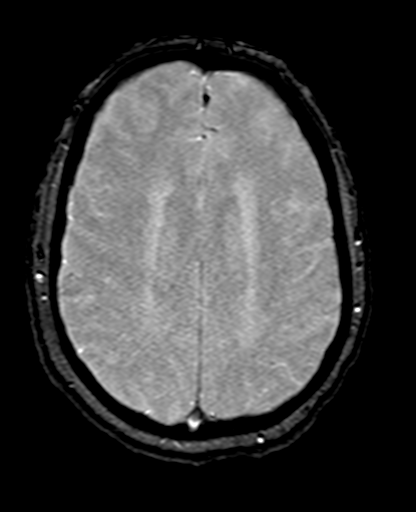
[im 52/52]
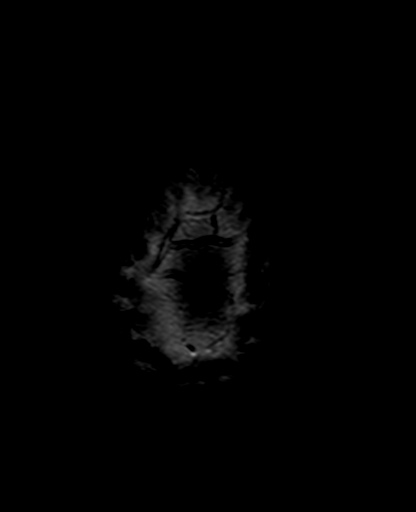

[Series 10: FLAIR · axial · 3.0mm · 0.86mm/px · z∈[-62,+82]mm · 4 of 51 slices shown]
[im 1/51]
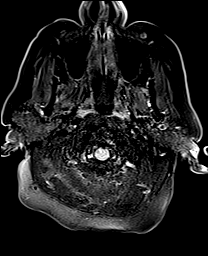
[im 17/51]
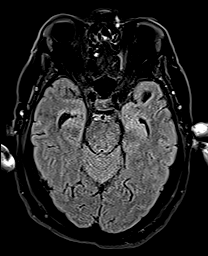
[im 34/51]
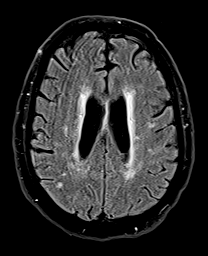
[im 51/51]
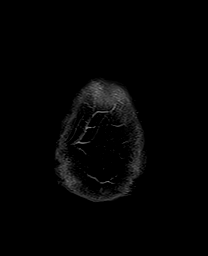

[Series 11: T1 · axial · 3.0mm · 0.45mm/px · z∈[-63,-14]mm · 2 of 52 slices shown]
[im 1/52]
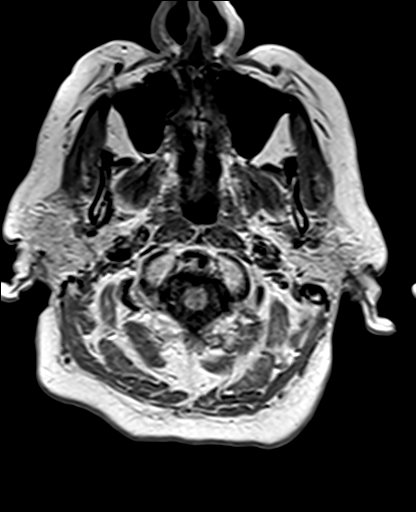
[im 18/52]
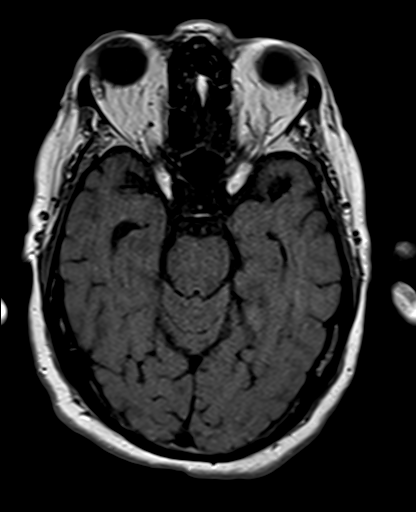

[Series 12: DWI · coronal · 5.0mm · 1.31mm/px · 5 of 64 slices shown (1 of 2)]
[im 1/64]
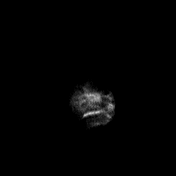
[im 16/64]
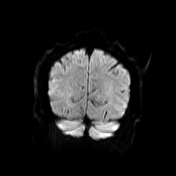
[im 32/64]
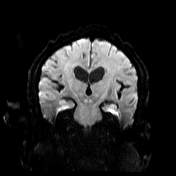
[im 48/64]
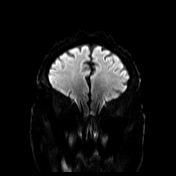
[im 64/64]
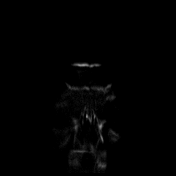

[Series 13: DWI · coronal · 5.0mm · 1.31mm/px · 3 of 32 slices shown (2 of 2)]
[im 1/32]
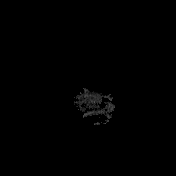
[im 16/32]
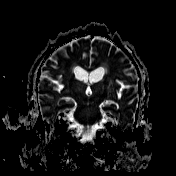
[im 32/32]
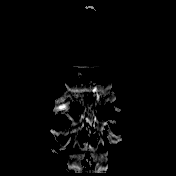

[Series 15: T1 post-contrast · sagittal · 5.0mm · 0.94mm/px · 2 of 24 slices shown (1 of 3)]
[im 1/24]
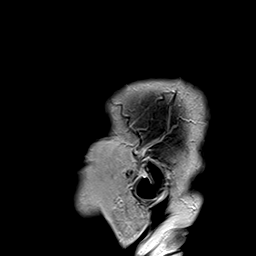
[im 24/24]
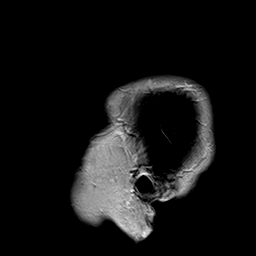

[Series 16: T1 post-contrast · axial · 3.0mm · 0.45mm/px · z∈[-63,+84]mm · 4 of 52 slices shown (2 of 3)]
[im 1/52]
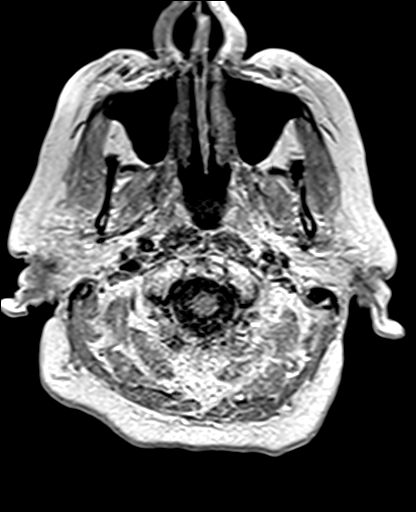
[im 18/52]
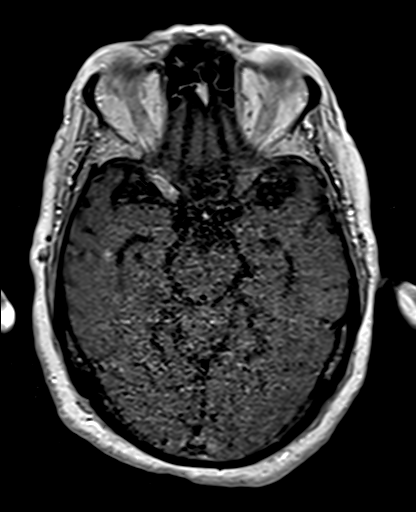
[im 35/52]
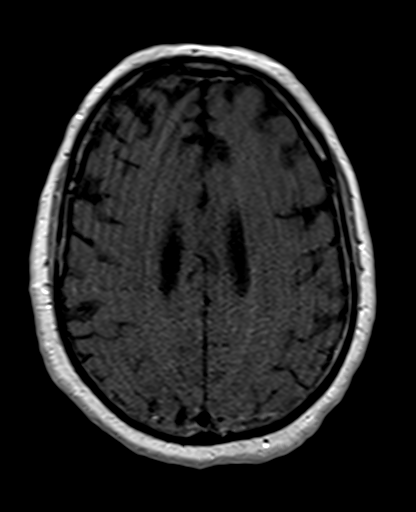
[im 52/52]
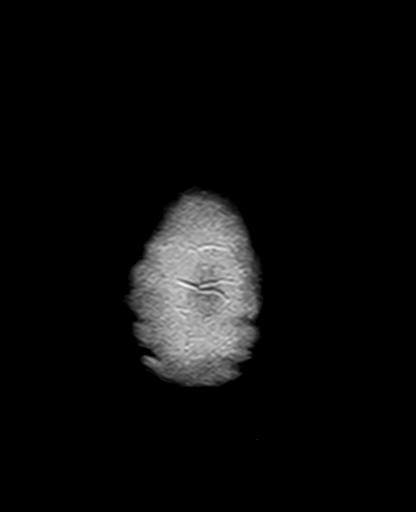

[Series 17: T2 post-contrast · coronal · 5.0mm · 0.86mm/px · 3 of 32 slices shown]
[im 1/32]
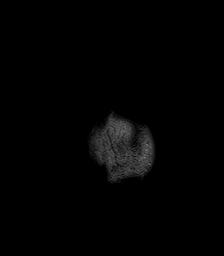
[im 16/32]
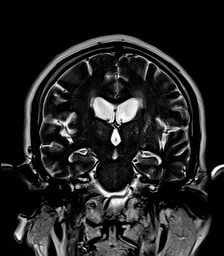
[im 32/32]
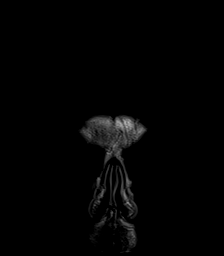

[Series 18: T1 post-contrast · coronal · 5.0mm · 0.43mm/px · 3 of 32 slices shown (3 of 3)]
[im 1/32]
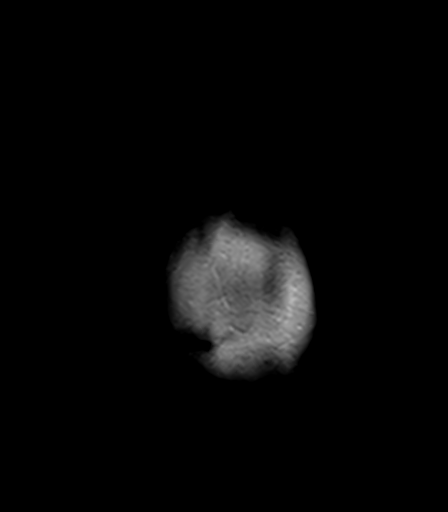
[im 16/32]
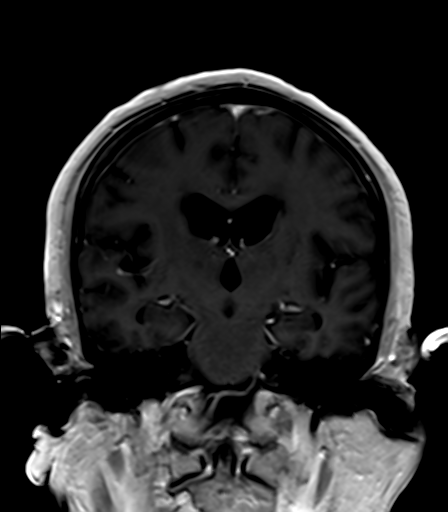
[im 32/32]
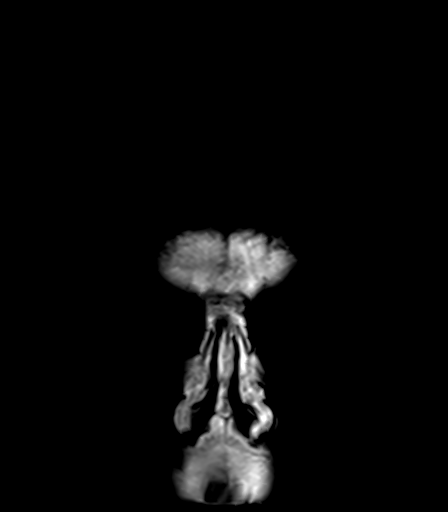

[34 of 48 positions shown; findings below may reference images not displayed]

FINDINGS: Study is intermittently degraded by motion artifact despite repeated
imaging attempts.

Brain: No restricted diffusion to suggest acute infarction. No
midline shift, mass effect, evidence of mass lesion,
ventriculomegaly, extra-axial collection or acute intracranial
hemorrhage. Cervicomedullary junction and pituitary are within
normal limits.

Black blood axial postcontrast images (series 16). No abnormal
enhancement identified. No dural thickening identified.

Patchy mild to moderate for age bilateral cerebral white matter T2
and FLAIR hyperintensity with some deep white matter capsule
involvement. Less pronounced T2 heterogeneity in the basal ganglia
and thalami. Similar mild to moderate signal heterogeneity in the
pons. No cortical encephalomalacia identified. Suggestion of a small
chronic microhemorrhage in the anterior right frontal lobe white
matter series 9, image 34. No other chronic cerebral blood products.
Negative cerebellum.

Vascular: Major intracranial vascular flow voids are preserved.

Skull and upper cervical spine: Visualized bone marrow signal is
within normal limits.

Sinuses/Orbits: Postoperative changes to both globes. Mild right
maxillary sinus mucous retention cyst. Mild right sphenoid sinus
mucosal thickening.

Other: Mild bilateral mastoid effusions. Visible nasopharynx is
within normal limits (small retention cyst series 8, image 3).
Negative visible scalp and face.
IMPRESSION: 1. No metastatic disease or acute intracranial abnormality
identified.
2. Up to moderate for age signal changes in the brain most commonly
due to chronic small vessel disease.

## 2021-10-17 MED ORDER — GADOBUTROL 1 MMOL/ML IV SOLN
10.0000 mL | Freq: Once | INTRAVENOUS | Status: AC | PRN
  Administered 2021-10-17: 8 mL via INTRAVENOUS

## 2021-10-18 ENCOUNTER — Other Ambulatory Visit: Payer: Self-pay

## 2021-10-18 ENCOUNTER — Ambulatory Visit
Admission: RE | Admit: 2021-10-18 | Discharge: 2021-10-18 | Disposition: A | Discharge status: Home / Self Care | Payer: Medicare Other | Source: Ambulatory Visit | Attending: Radiation Oncology | Admitting: Radiation Oncology

## 2021-10-18 ENCOUNTER — Telehealth: Payer: Self-pay | Admitting: Internal Medicine

## 2021-10-18 DIAGNOSIS — C3431 Malignant neoplasm of lower lobe, right bronchus or lung: Secondary | ICD-10-CM | POA: Diagnosis not present

## 2021-10-18 NOTE — Telephone Encounter (Signed)
Katy Apo wife is returning phone call. Katy Apo phone number is [phone]. ?

## 2021-10-18 NOTE — Telephone Encounter (Signed)
Called patient and left voicemail for him to call office back in regards to his questions about his CT scan that is scheduled 10/30/2021.  ?

## 2021-10-18 NOTE — Telephone Encounter (Signed)
Called and spoke with patient's wife Katy Apo. She wanted to know if the patient still needed to have the CT scan that was ordered by back in January. He is scheduled to receive his last radiation treatment on 10/23/21 and they were told that after the last treatment, he would have a CT scan. She wanted to know if the oncologist did a CT would he need to have another one.  ? ?I reviewed his chart and the only CT I see scheduled is the one MR ordered. His CT is the exact CT that oncologist ordered.  ? ?She is aware that they are both the same CT and that he should keep the one already scheduled for 10/30/21 as oncology shares the same system as [place].  ? ?Nothing further needed at time of call.  ?

## 2021-10-22 ENCOUNTER — Telehealth: Payer: Self-pay | Admitting: Radiation Oncology

## 2021-10-22 NOTE — Telephone Encounter (Signed)
I called to confirm the patient was aware of the good results from his MRI. We discussed PCI for prevention of brain disease. He would like to discuss this with Dr. Lisbeth Renshaw tomorrow during his under treatment visit. ?

## 2021-10-23 ENCOUNTER — Other Ambulatory Visit: Payer: Self-pay

## 2021-10-23 ENCOUNTER — Other Ambulatory Visit: Payer: Self-pay | Admitting: Radiation Oncology

## 2021-10-23 ENCOUNTER — Ambulatory Visit
Admission: RE | Admit: 2021-10-23 | Discharge: 2021-10-23 | Disposition: A | Discharge status: Home / Self Care | Payer: Medicare Other | Source: Ambulatory Visit | Attending: Radiation Oncology | Admitting: Radiation Oncology

## 2021-10-23 ENCOUNTER — Encounter: Payer: Self-pay | Admitting: Radiation Oncology

## 2021-10-23 DIAGNOSIS — C3431 Malignant neoplasm of lower lobe, right bronchus or lung: Secondary | ICD-10-CM | POA: Diagnosis not present

## 2021-10-23 LAB — RAD ONC ARIA SESSION SUMMARY
Course Elapsed Days: 7
Plan Fractions Treated to Date: 3
Plan Prescribed Dose Per Fraction: 18 Gy
Plan Total Fractions Prescribed: 3
Plan Total Prescribed Dose: 54 Gy
Reference Point Dosage Given to Date: 54 Gy
Reference Point Session Dosage Given: 18 Gy
Session Number: 3

## 2021-10-23 MED ORDER — OXYCODONE HCL 5 MG PO TABS
5.0000 mg | ORAL_TABLET | ORAL | 0 refills | Status: DC | PRN
Start: 2021-10-23 — End: 2022-08-09

## 2021-10-23 NOTE — Progress Notes (Signed)
?  Radiation Oncology         [phone] ?________________________________ ? ?Name: Francisco [name] MRN: [id number]  ?Date: 10/23/2021  DOB: 06-08-1946 ? ?End of Treatment Note ? ?Diagnosis:    Limited Stage , [id number], Small cell carcinoma of the RLL  ? ?Indication for treatment:  Curative      ? ?Radiation treatment dates:   10/16/21-10/23/21 ? ?Site/dose:   The tumor in the RLL was treated with a course of stereotactic body radiation treatment. The patient received 54 Gy In 3 fractions at 18 G per fraction. ? ?Narrative: The patient tolerated radiation treatment relatively well.   The patient did not have any signs of acute toxicity during treatment however he had pain in his T spine as a result of compression fracture that occurred prior to his radiation. A new rx was requested for his pain management. ? ?Plan: The patient will receive a call in about one month from the radiation oncology department. We did discuss the rationale for prophylactic cranial irradiation as well given his histology. He is not interested in proceeding, but open to repeat MRI in 3 months. He will continue follow up with Dr. Julien Nordmann for surveillance and Dr. [name] in orthopedics as well, but a new rx for Oxycodone was sent to his pharmacy.  ? ? ? ? ? ?Francisco [name], PAC  ? ?

## 2021-10-24 ENCOUNTER — Other Ambulatory Visit: Payer: Self-pay | Admitting: Radiation Oncology

## 2021-10-24 ENCOUNTER — Telehealth: Payer: Self-pay | Admitting: Internal Medicine

## 2021-10-24 DIAGNOSIS — C3490 Malignant neoplasm of unspecified part of unspecified bronchus or lung: Secondary | ICD-10-CM

## 2021-10-24 NOTE — Telephone Encounter (Signed)
Called and spoke with pt letting him know that the CT has been cancelled and he verbalized understanding. Nothing further needed. ?

## 2021-10-25 ENCOUNTER — Ambulatory Visit: Payer: Medicare Other | Admitting: Internal Medicine

## 2021-10-30 ENCOUNTER — Other Ambulatory Visit: Payer: Medicare Other

## 2021-10-30 ENCOUNTER — Encounter: Payer: Self-pay | Admitting: Internal Medicine

## 2021-10-31 ENCOUNTER — Telehealth: Payer: Self-pay | Admitting: Internal Medicine

## 2021-10-31 DIAGNOSIS — J441 Chronic obstructive pulmonary disease with (acute) exacerbation: Secondary | ICD-10-CM

## 2021-10-31 NOTE — Telephone Encounter (Signed)
See pt email. Pt needs to be called on 4/27 to address symptoms.  ?

## 2021-10-31 NOTE — Telephone Encounter (Signed)
Pt is still having a flare up and is requesting medication ''cefdinir'' to be sent to [facility] pharmacy in [place],  ?

## 2021-11-01 MED ORDER — CEFDINIR 300 MG PO CAPS: 300.0000 mg | ORAL_CAPSULE | Freq: Two times a day (BID) | ORAL | 0 refills | Status: DC

## 2021-11-01 MED ORDER — PREDNISONE 10 MG PO TABS: ORAL_TABLET | ORAL | 5 refills | Status: AC

## 2021-11-01 NOTE — Telephone Encounter (Signed)
? ?  Okay to do Omnicef 3 mg twice daily ? ? ?Please take Take prednisone 40mg  once daily x 3 days, then 30mg  once daily x 3 days, then 20mg  once daily x 3 days, then prednisone 10mg  once daily  -baseline dose to continue ? ?Also please let him know that there is a new research protocol that is looking at the subcutaneous injection to prevent COPD exacerbations.  The mechanism looks exciting.  We will consider him for this if he qualifies ?

## 2021-11-01 NOTE — Telephone Encounter (Signed)
Called and spoke with pt letting him know the info per MR and he verbalized understanding. Meds have been sent to preferred pharmacy. Nothing further needed. ?

## 2021-11-01 NOTE — Telephone Encounter (Signed)
Called and spoke with patient. He stated that he believes he is having a COPD flare up. He has been coughing up thick yellow phlegm for the past 3 days. Increased SOB and wheezing. Denied any fevers or body aches. Also denied being around anyone who has been sick recently.  ? ?He confirmed that he is still using Symbicort 160 and Spiriva 2.47mcg.  ? ?He stated that he has been prescribed Omnicef before in the past for a COPD flare up and it worked great for him. He would like to have a RX for [employer].  ? ?[facility] [place].  ? ?MR, please advise. Thanks!  ?

## 2021-11-26 ENCOUNTER — Telehealth: Payer: Self-pay | Admitting: Family Medicine

## 2021-11-26 NOTE — Telephone Encounter (Signed)
Patients wife called stated patient has an ulcer in his mouth and would like dr hunter to call in magic mouth wash - stated he has a new apt with dentist has his recent dentis retired.   Patient seeing dentist on 12/04/21-   If able to send this over, please send [id number] [place], [place] [address]

## 2021-11-26 NOTE — Telephone Encounter (Signed)
Dr hunter does not have avl apts until 6/1- pt will see dr Cherlynn Kaiser on 5/23 at 10am

## 2021-11-26 NOTE — Telephone Encounter (Signed)
Pt will need OV to be evaluated.

## 2021-11-27 ENCOUNTER — Ambulatory Visit (INDEPENDENT_AMBULATORY_CARE_PROVIDER_SITE_OTHER): Payer: Medicare Other | Admitting: Family Medicine

## 2021-11-27 ENCOUNTER — Encounter: Payer: Self-pay | Admitting: Family Medicine

## 2021-11-27 VITALS — BP 118/70 | HR 112 | Temp 98.1°F | Ht 72.0 in

## 2021-11-27 DIAGNOSIS — K130 Diseases of lips: Secondary | ICD-10-CM

## 2021-11-27 MED ORDER — MAGIC MOUTHWASH: 5.0000 mL | Freq: Three times a day (TID) | ORAL | 0 refills | Status: DC | PRN

## 2021-11-27 NOTE — Progress Notes (Signed)
Subjective:     Patient ID: Francisco [name], male    DOB: Apr 14, 1946, 76 y.o.   MRN: [id number]  Chief Complaint  Patient presents with   Mouth Lesions    Has a place on inside of lip that he noticed 1 week ago, has had before and was prescribed magic mouthwash     HPI-here w/wife Noticed lesion in mouth 1 wk ago.  No pain. No injury.  No f/c.  Lung ca-finished rad 1 mo ago.   Seeing DDS in 1 wk Health Maintenance Due  Topic Date Due   Zoster Vaccines- Shingrix (1 of 2) Never done   COLONOSCOPY (Pts 45-33yrs Insurance coverage will need to be confirmed)  04/02/2020    Past Medical History:  Diagnosis Date   Chronic back pain    "mid and lower" (04/07/2018)   Chronic rhinitis    -Sinus Ct 08/01/2009 >> Bilateral maxillary sinusitis with some mucosal thickeningin the sphenoid and frontal sinuses as well with air fluid levels present -chronic rhinitis flyer Aug 04, 2009   Compressed spine fracture [facility])    COPD (chronic obstructive pulmonary disease) ([place])    PFT's rec Jul 17, 2009   Dyspnea    Emphysema lung ([place])    On home oxygen therapy    "3L; 24/7" (04/07/2018)   Onychomycosis    Dr. Judi Cong   Orthostatic hypotension    "since 10/2017" (04/07/2018)   PAD (peripheral artery disease) ([place])    Pneumonia    "twice in 1 year" (04/07/2018)   Pulmonary embolism ([place]) 04/07/2018   Skin cancer    "lips, face, ears, arms" (04/07/2018)   Small cell lung cancer, right ([place]) 2023   Dr. Lisbeth Renshaw.  rad   Vertigo    "since ~ 02/2018" (04/07/2018)    Past Surgical History:  Procedure Laterality Date   ABDOMINAL AORTOGRAM W/LOWER EXTREMITY N/A 01/21/2020   Procedure: ABDOMINAL AORTOGRAM W/LOWER EXTREMITY;  Surgeon: Elam Dutch, MD;  Location: [place] [facility];  Service: Cardiovascular;  Laterality: N/A;   APPENDECTOMY     BIOPSY  02/20/2020   Procedure: BIOPSY;  Surgeon: Rush Landmark Telford Nab., MD;  Location: [place];  Service: Gastroenterology;;   BRONCHIAL BIOPSY   09/04/2021   Procedure: BRONCHIAL BIOPSIES;  Surgeon: Garner Nash, DO;  Location: [facility];  Service: Pulmonary;;   BRONCHIAL BRUSHINGS  09/04/2021   Procedure: BRONCHIAL BRUSHINGS;  Surgeon: Garner Nash, DO;  Location: [place];  Service: Pulmonary;;   BRONCHIAL NEEDLE ASPIRATION BIOPSY  09/04/2021   Procedure: BRONCHIAL NEEDLE ASPIRATION BIOPSIES;  Surgeon: Garner Nash, DO;  Location: [place];  Service: Pulmonary;;   CATARACT EXTRACTION, BILATERAL     ENDARTERECTOMY FEMORAL Right 04/19/2019   Procedure: ENDARTERECTOMY RIGHT COMMON FEMORAL;  Surgeon: Elam Dutch, MD;  Location: [facility];  Service: Vascular;  Laterality: Right;   ENDARTERECTOMY FEMORAL Left 01/24/2020   Procedure: LEFT FEMORAL ENDARTERECTOMY WITH DACRON PATCH ANGIOPLASTY;  Surgeon: Elam Dutch, MD;  Location: [facility];  Service: Vascular;  Laterality: Left;   ESOPHAGOGASTRODUODENOSCOPY (EGD) WITH PROPOFOL N/A 02/20/2020   Procedure: ESOPHAGOGASTRODUODENOSCOPY (EGD) WITH PROPOFOL;  Surgeon: Irving Copas., MD;  Location: [place];  Service: Gastroenterology;  Laterality: N/A;   FEMORAL ENDARTERECTOMY Left 01/24/2020   FIDUCIAL MARKER PLACEMENT  09/04/2021   Procedure: FIDUCIAL MARKER PLACEMENT;  Surgeon: Garner Nash, DO;  Location: [facility];  Service: Pulmonary;;   HEMOSTASIS CLIP PLACEMENT  02/20/2020   Procedure: HEMOSTASIS CLIP PLACEMENT;  Surgeon:  Mansouraty, Telford Nab., MD;  Location: [facility];  Service: Gastroenterology;;   HOT HEMOSTASIS N/A 02/20/2020   Procedure: HOT HEMOSTASIS (ARGON PLASMA COAGULATION/BICAP);  Surgeon: Irving Copas., MD;  Location: [place] [facility];  Service: Gastroenterology;  Laterality: N/A;   INSERTION OF ILIAC STENT Left 01/24/2020   Procedure: INSERTION OF VBX STENT 8X59 AND 8X39 LEFT COMMON ILIAC ARTERY. INSERTION OF INNOVA 7 X 60 INNOVA STENT LEFT EXTERNAL ILIAC ARTERY. ;  Surgeon: Elam Dutch, MD;  Location: [facility];  Service:  Vascular;  Laterality: Left;   LOWER EXTREMITY ANGIOGRAPHY  12/11/2018   LOWER EXTREMITY ANGIOGRAPHY N/A 12/11/2018   Procedure: LOWER EXTREMITY ANGIOGRAPHY;  Surgeon: Elam Dutch, MD;  Location: [facility];  Service: Cardiovascular;  Laterality: N/A;   PATCH ANGIOPLASTY Right 04/19/2019   Procedure: Patch Angioplasty;  Surgeon: Elam Dutch, MD;  Location: [facility];  Service: Vascular;  Laterality: Right;   PERIPHERAL VASCULAR INTERVENTION Right 12/11/2018   Procedure: PERIPHERAL VASCULAR INTERVENTION;  Surgeon: Elam Dutch, MD;  Location: [facility];  Service: Cardiovascular;  Laterality: Right;  Common Iliac    SKIN CANCER EXCISION     "lips, face, ears, arms" (04/07/2018)   TONSILLECTOMY     ULTRASOUND GUIDANCE FOR VASCULAR ACCESS Right 01/24/2020   Procedure: ULTRASOUND GUIDANCE FOR VASCULAR ACCESS;  Surgeon: Elam Dutch, MD;  Location: [facility];  Service: Vascular;  Laterality: Right;   VIDEO BRONCHOSCOPY WITH RADIAL ENDOBRONCHIAL ULTRASOUND  09/04/2021   Procedure: VIDEO BRONCHOSCOPY WITH RADIAL ENDOBRONCHIAL ULTRASOUND;  Surgeon: Garner Nash, DO;  Location: [facility];  Service: Pulmonary;;    Outpatient Medications Prior to Visit  Medication Sig Dispense Refill   acetaminophen (TYLENOL) 500 MG tablet Take 1,000 mg by mouth every 6 (six) hours as needed for moderate pain.      albuterol (VENTOLIN HFA) 108 (90 Base) MCG/ACT inhaler USE 2 PUFFS EVERY 6 HOURS  AS NEEDED FOR WHEEZING 18 g 3   Calcium Carbonate-Vitamin D (CALCIUM 600+D PO) Take 2 tablets by mouth daily.     cefdinir (OMNICEF) 300 MG capsule Take 1 capsule (300 mg total) by mouth 2 (two) times daily. 20 capsule 0   ELIQUIS 2.5 MG TABS tablet TAKE 1 TABLET BY MOUTH  TWICE DAILY 180 tablet 3   Ferrous Sulfate (IRON) 325 (65 Fe) MG TABS Take 325 mg by mouth daily.     fluticasone (FLONASE) 50 MCG/ACT nasal spray Place 2 sprays into both nostrils daily. 48 g 3   folic acid (FOLVITE) 1 MG  tablet Take 1 tablet (1 mg total) by mouth daily.     furosemide (LASIX) 20 MG tablet TAKE 1 TABLET (20 MG TOTAL) BY MOUTH DAILY AS NEEDED FOR FLUID OR EDEMA. (Patient taking differently: Take 20 mg by mouth daily.) 90 tablet 3   Multiple Vitamin (MULTIVITAMIN WITH MINERALS) TABS tablet Take 1 tablet by mouth daily.     mupirocin cream (BACTROBAN) 2 % Apply 1 application topically 2 (two) times daily. (Patient taking differently: Apply 1 application. topically daily as needed (Cyst).) 22 g 0   oxyCODONE (OXY IR/ROXICODONE) 5 MG immediate release tablet Take 1 tablet (5 mg total) by mouth every 4 (four) hours as needed for severe pain. 60 tablet 0   OXYGEN Inhale 4-5 L into the lungs continuous.      pantoprazole (PROTONIX) 20 MG tablet Take 1 tablet (20 mg total) by mouth daily. 90 tablet 1   Polyethyl Glycol-Propyl Glycol (SYSTANE) 0.4-0.3 %  SOLN Place 1 drop into both eyes every other day.     Respiratory Therapy Supplies (FLUTTER) DEVI 10 times Twice a day and prn as needed, may increase if feeling worse 1 each 0   roflumilast (DALIRESP) 500 MCG TABS tablet TAKE 1 TABLET BY MOUTH DAILY 90 tablet 3   rosuvastatin (CRESTOR) 10 MG tablet TAKE 1 TABLET BY MOUTH  DAILY 90 tablet 3   SYMBICORT 160-4.5 MCG/ACT inhaler USE 2 INHALATIONS BY MOUTH  TWICE DAILY 30.6 g 3   Tiotropium Bromide Monohydrate (SPIRIVA RESPIMAT) 2.5 MCG/ACT AERS USE 2 INHALATIONS BY MOUTH  DAILY 12 g 3   No facility-administered medications prior to visit.    Allergies  Allergen Reactions   Tape Other (See Comments)    SKIN IS VERY THIN, TEARS SKIN; CAN ONLY USE COBAN WRAPS DUE TO CONDITION OF SKIN!!   Amoxicillin-Pot Clavulanate Other (See Comments)    Thrush/ Sore throat/Voice became hoarse   Ciprofloxacin Nausea Only    Sick on stomach, weak/tired   Levaquin [Levofloxacin] Other (See Comments)    hallucinations   Lipitor [Atorvastatin] Rash   ROS neg/noncontributory except as noted HPI/below      Objective:      BP 118/70   Pulse (!) 112   Temp 98.1 F (36.7 C) (Temporal)   Ht 6' (1.829 m)   SpO2 95% Comment: 4L O2  BMI 24.82 kg/m  Wt Readings from Last 3 Encounters:  09/20/21 183 lb (83 kg)  09/18/21 183 lb (83 kg)  09/11/21 185 lb (83.9 kg)    Physical Exam   Gen: [facility] NAD wm in w/c.  On O2 HEENT: NCAT, conjunctiva not injected, sclera nonicteric Op-R lower lip-inside-bb sized mucoid looking papule w/white top.  Can't drain w/light pressure.  No palp lesions in mouth.   NECK:  supple, no thyromegaly, no nodes, no carotid bruits CARDIAC: tachyRRR, S1S2+, no murmur.  LUNGS: CTAB. Some exp wheezes.  distant ABDOMEN:  BS+, soft, NTND, No HSM, no masses EXT:  no edema MSK: no gross abnormalities.  NEURO: A&O x3.  CN II-XII intact.  PSYCH: normal mood. Good eye contact     Assessment & Plan:   Problem List Items Addressed This Visit   None Visit Diagnoses     Lip lesion    -  Primary      Lip lesion-?mucoid cyst, ?bit it(no pain). Other.  Pt req magic mouthwash-double will help but ok.  Has f/u DDS next wk-advised to show to him.  May spont resolve.  Worse, inc pain, etc, f/u.  May need ENT  Meds ordered this encounter  Medications   magic mouthwash SOLN    Sig: Take 5 mLs by mouth 3 (three) times daily as needed for mouth pain.    Dispense:  150 mL    Refill:  0    Wellington Hampshire, MD

## 2021-11-28 ENCOUNTER — Telehealth: Payer: Self-pay

## 2021-11-28 MED ORDER — NYSTATIN 100000 UNIT/ML MT SUSP: 5.0000 mL | Freq: Four times a day (QID) | OROMUCOSAL | 1 refills | Status: DC

## 2021-11-28 NOTE — Telephone Encounter (Signed)
Pt called wanting to know if he will still need the lab appt before his appt with Dr. Julien Nordmann given he has his annual physical just prior.  I have called the pt back and advised given he is seeing a [facility] provider, they can see the orders from Dr. Julien Nordmann and can draw them at the same time as Dr. Ronney Lion orders BUT this is at their discretion. Pt was advised he will need to alert Dr. Yong Channel and the lab tech he would like this done so they know to look for them.  Pt expressed understanding that he may not be able to have Dr. Worthy Flank labs drawn at [facility] as it may not be within their policy. Pt also understanding to call us back to cancel his lab appt if [facility] can draw them.

## 2021-11-28 NOTE — Telephone Encounter (Signed)
Is calling to get formulary for magic mouthwash.

## 2021-11-28 NOTE — Addendum Note (Signed)
Addended by: Wellington Hampshire on: 11/28/2021 01:02 PM   Modules accepted: Orders

## 2021-11-28 NOTE — Telephone Encounter (Signed)
Called pharmacy.  Pharmacy states they spoke with someone at our office around 2:40pm to get the formulary.

## 2021-12-04 ENCOUNTER — Other Ambulatory Visit: Payer: Self-pay | Admitting: Internal Medicine

## 2021-12-05 ENCOUNTER — Telehealth: Payer: Self-pay | Admitting: Internal Medicine

## 2021-12-05 ENCOUNTER — Ambulatory Visit (INDEPENDENT_AMBULATORY_CARE_PROVIDER_SITE_OTHER): Payer: Medicare Other | Admitting: Internal Medicine

## 2021-12-05 ENCOUNTER — Encounter: Payer: Self-pay | Admitting: Internal Medicine

## 2021-12-05 VITALS — BP 112/70 | HR 103 | Ht 72.0 in | Wt 174.6 lb

## 2021-12-05 DIAGNOSIS — J441 Chronic obstructive pulmonary disease with (acute) exacerbation: Secondary | ICD-10-CM

## 2021-12-05 DIAGNOSIS — J449 Chronic obstructive pulmonary disease, unspecified: Secondary | ICD-10-CM | POA: Diagnosis not present

## 2021-12-05 MED ORDER — CEFDINIR 300 MG PO CAPS: 300.0000 mg | ORAL_CAPSULE | Freq: Two times a day (BID) | ORAL | 0 refills | Status: DC

## 2021-12-05 MED ORDER — PREDNISONE 10 MG PO TABS
ORAL_TABLET | ORAL | 0 refills | Status: AC
Start: 2021-12-05 — End: 2021-12-11

## 2021-12-05 NOTE — Telephone Encounter (Signed)
Called patient regarding upcoming June appointments, patient is notified.  

## 2021-12-05 NOTE — Progress Notes (Signed)
ROV 12/30/16 -- patient has a history of tobacco use, COPD currently maintained on chronic prednisone, Symbicort, Spiriva. Most recent blurry function testing was March 2011 with an FEV1 of 1.2 L (34% Pred), severe obstruction. He up titrates his prednisone on his own based on symptoms. No abx since last time. He has increased pred temporrily about 4 -5 x since last time. No increases for over a month. He is smoking a pipe, not cigarettes. He is using HCTZ more frequently these days. He is on flonase and singulair. He does note that he has some slow progression of his DOE. He coughs a few times a day, clears clear sputum.   rov 07/17/17 --this is a follow-up visit for patient with active tobacco use, severe COPD and chronic prednisone use.  He often titrates his prednisone on his own depending on how he is feeling. He complains of a new pain in his back below his left shoulder blade, has been present for the last 10 days. sometimes pleuritic, can be stabbing, worse w cough. His pred use: on 65m for the last 19 days. No hx VTE. He has LE edema, venous stasis changes, increased since his diuretics decreased.   ROV 09/23/17 --76year old man with a history of tobacco use (still smokes pipe), severe COPD and severe obstruction on spirometry.  At his last visit he had some pleuritic left back pain and I performed a CT PA that I have reviewed from 07/17/17.  There was no pulmonary embolism but extensive emphysema present, some bilateral apical scar more so on the right, stable.  He uses prednisone 10 mg daily and uptitrate depending on his day-to-day symptoms.  He is otherwise managed on Symbicort and Spiriva.  Desaturated on arrival today after ambulating to the exam room. He reports more dyspnea, has increased pred intermittently.    OV 12/05/2017  Chief Complaint  Patient presents with   Follow-up    Switching from RB to MR.   Pt is on 3L with exertion and 2L at rest. Pt states his breathing had become  worse but since he has been on O2 since 3/21, it has really helped with his breathing.  Pt was in hospital 4/28-5/3. Other than SOB, pt does have c/o cough, rattling in chest. Denies any CP/chest tightness.   History from patient, his wife and review of the old chart  [name] a transfer of care from RBaltazar Apoto myself Dr. RChase Caller  His son is my patient and therefore he is done this transfer of care.  According to the patient he is to be seen by Dr. CGwenette Greetfor COPD.  Patient is FEV1 34% based on March 2011 PFTs according to chart review.  At baseline he is maintained on Spiriva, Symbicort, Singulair, chronic daily prednisone 10 mg/day for at least 3 years, and 3 times a week of azithromycin for 2 years.  Earlier this year in March 2019 he went on daytime oxygen and following a recent hospitalization April-May 2019.  For syncope that was believed due to nocturnal hypoxemia he was started on night oxygen.  Since starting oxygen his edema has improved and his overall quality of life is improved and he stopped having any orthostasis or presyncopal episodes.  He is grateful for the fact that he is on oxygen.  His current COPD CAT score and severity of symptoms is rated below and is 26.  Show significant amount of symptom burden.  His main goal is to improve his quality  of life.  He had his wife have many questions centering around quality of life, medication therapy.  Of note he has had some thoracic vertebral fractures that are new.  This happened after the fall in late April 2019.  Discovered at admission last month.  He feels is due to prednisone.  He is wondering about portable oxygen.   OV 01/29/2018  Chief Complaint  Patient presents with   Follow-up    Pt states he has been doing okay since last visit. States breathing is about the same, has an occ cough but states the O2 has helped out a lot with breathing and cough. Denies any complaints of CP.   Lesia Sago II , 76 y.o. , with  dob 08-12-1945 and male ,Not Hispanic or Latino from [place] [address] - presents to lung clinic for advanced COPD follow-up. Since his last visit his symptoms course of improvement. Score is 26 and severe symptoms of document below. He is on Spiriva, Symbicort, continues oxygen, daily prednisone and azithromycin 3 times a week. He has stopped his singulair without any problems. Overall he says he is better. He is willing to give TRELEGY inhaler trial. Last visit to check blood gas and he does not have hypercapnia so he does not qualify for BiPAP        OV 04/03/2018  Subjective:  Patient ID: Marcelyn Bruins, male , DOB: [birth year] , age 76 y.o. , MRN: [id number] , ADDRESS: [place] [address]   04/03/2018 -   Chief Complaint  Patient presents with   Acute Visit    Pt's O2 sats have been dropping into the 70s with little exertion.  Pt took O2 off just to shave 9/26 and after he finished shaving put the O2 back on, walked from the bathroom down to the living room on O2 and sats were at 77% on 3L 9/26. Pt also has c/o cough with white to yellow mucus and chest tightness with the SOB.     HPI ANGUS AMINI II 76 y.o. -acute visit for this patient.  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  He is chronically prednisone dependent.  He also is on schedule azithromycin.  He called in yesterday feeling unwell therefore we asked him to come in today.  He tells me that approximately a week ago he started having increased cough and congestion.  Yesterday primary care physician following a routine visit thought he was in COPD exacerbation and started him on doxycycline.  He also personally bumped up his baseline prednisone.  He does me that yesterday while he was shaving on room air he felt more short of breath than usual.  Also when he walked he desaturated more than usual therefore he decided to call in and come in today.  There  is no fever or chills or hemoptysis or colored sputum.  No leg edema.  No orthopnea.  He does not feel like he needs to be in the emergency department or get admitted.   OV 05/11/2018  Subjective:  Patient ID: Marcelyn Bruins, male , DOB: 1946-01-26 , age 76 y.o. , MRN: [id number] , ADDRESS: [place] [address]   05/11/2018 -   Chief Complaint  Patient presents with   Follow-up    pt states breathing has wrosen since last OV. increased sob, occ chest tightness, prod cough with yellow mucus & chest  congestion. currently taking trelegy samples. recent admission 04/07/18 for PE.     HPI Lesia Sago II 76 y.o. -advanced COPD with chronic hypoxemic respiratory failure [not a BiPAP candidate because of lack of hypercapnia] presents with his wife for follow-up.  After the last visit he continued to have symptoms so on April 07, 2018 he got an outpatient CT scan and he had pulmonary embolism.  He got admitted to the hospital and then discharged and since then has been on Xarelto.  He continues his baseline 2-3 L of nasal cannula oxygen but he says since then he is more symptomatic.  Is also corresponds with him starting Trelegy inhaler and also for the last few to several weeks is having increased cough and congestion and yellow phlegm.  His COPD CAT score is deteriorated to 35 and above.  He is very frustrated by his symptoms.  Review of his medication shows that he takes azithromycin and chronic prednisone and Trelegy and oxygen for his COPD.  He is on Xarelto for his blood clots.  He is also on Fosamax for prednisone dependent osteoporosis management.  His smoking is in remission.  Wife says he is always sedentary.  Apparently they tried to do some physical therapy a month ago but he desaturated.  He does not seem to keen on physical therapy right now     OV 06/10/2018  Subjective:  Patient ID: Marcelyn Bruins, male , DOB: 12-13-1945 , age 29 y.o. , MRN: [id number] ,  ADDRESS: [place] [address]   06/10/2018 -   Chief Complaint  Patient presents with   Follow-up    follows for COPD. patient has increased SOB with exertion     HPI Lesia Sago II 76 y.o. -returns for gold stage IV COPD follow-up with chronic hypoxemic respiratory failure.  I saw him approximately a month ago at which time recommended Trelegy.  However the Trelegy is not working well for him.  He feels Spiriva and Symbicort is of benefit for him.  Also recommended he start himself on Daliresp.  Given the GI side effect profile I communicated with his primary care physician about stopping Fosamax.  His primary care physician has advised Reclast infusion for him and is considering this but has not yet switched over.  This because the infusions will have to happen at [facility] long.  However he never started his Daliresp because he read the side effect profile and was worried about back pain.  He wanted me to go over the side effect profile of Daliresp all over again.  I printed up-to-date and went over all the side effects.  I did this with him and his wife.  There is a 3% incidence of backache.  The dominant feature is GI issues of weight loss and diarrhea and nausea and abdominal pain.  The second dominant feature is some anxiety issues.  After reading all this he has decided to start [place].  He is aware of the limitations as well.  He is aware that this is purely preventive in reducing COPD exacerbation frequency.  He understands that the burden on his health from frequent COPD exacerbations is high.  Currently COPD CAT score is back at baseline        OV 07/22/2018  Subjective:  Patient ID: Marcelyn Bruins, male , DOB: [birth year] , age 58 y.o. , MRN: [id number] , ADDRESS: [place] [address]   07/22/2018 -  Chief Complaint  Patient presents with   Follow-up    Pt states it has been rough since last visit. States he has been coughing with  white to yellow phlegm, wheezing, increased SOB which has been going on x2-3 weeks now. Pt denies any real complaints of chest tightness/chest pain.     HPI DANIAL HLAVAC II 76 y.o. -presents for follow-up of his advanced COPD with chronic hypoxemic respiratory failure.  At last visit we introduce Daliresp as is an effort to prevent COPD flareups.  After some trepidation he started taking it.  He is currently on full dose 5 mcg daily and is tolerating it well.  However he does not think it is reduced the frequency of flareups but again it is too soon to tell.  Approximately over a week ago after some sick contact exposure he had symptoms and signs of COPD exacerbation.  He was given I suspect Levaquin but this caused some confusion.  After that he was changed to doxycycline.  Today's his last of 7 days with doxycycline he is better.  COPD CAT score is improved to 26 but he still is coughing quite a bit and has mucus and feels that he is still not fully back to baseline.  He had a chest x-ray July 13, 2018 that reports this is clear.  He continues on oxygen Spiriva and Symbicort.  There are no other new issues.  No chest pain edema hemoptysis or weight loss.    OV 03/05/2019  Subjective:  Patient ID: Marcelyn Bruins, male , DOB: 1946/02/08 , age 66 y.o. , MRN: [id number] , ADDRESS: [place] [address]   03/05/2019 -   Chief Complaint  Patient presents with   COPD with acute exacerbation    Feels it is a little worse since June. Has cough with congestion, was white in color. But this morning it has a yellow and green color to it.     HPI KOBEN DAMAN II 76 y.o. -returns for his advanced COPD follow-up.  He says that since his last visit he has been doing overall fair and stable.  Uses 3 L of oxygen, Daliresp, Spiriva, Symbicort.  He has been avoiding risk activities for COVID-19.  He says in the last few weeks he has had increased congestion he does not think this is  COVID-19.  Today he has green-yellow sputum.  He is asking about taking a flu shot.    OV 05/07/2019  Subjective:  Patient ID: Marcelyn Bruins, male , DOB: 1946/02/28 , age 81 y.o. , MRN: [id number] , ADDRESS: [place] [address]   05/07/2019 -   Chief Complaint  Patient presents with   Follow-up    Pt states his breathing is at his baseline. Pt c/o prod cough with clear mucus - baseline for pt. Pt denies CP/tightness and f/c/s.    Advance COPD with chronic hypoxemic respiratory failure.  Baseline functional status ECOG 3-4  HPI Lesia Sago II 76 y.o. -presents for COPD follow-up.  He continues to be stable using oxygen.  His current COPD CAT score is 30 which is his baseline.  He recently had a vascular surgery.  He is on oxygen, prednisone, Daliresp, Spiriva and Symbicort.  He is gone back to taking azithromycin for prophylaxis m again COPD exacerbation.        OV 11/11/2019  Subjective:  Patient ID: Marcelyn Bruins, male , DOB: 1945-10-13 ,  age 70 y.o. , MRN: [id number] , ADDRESS: [place] [address]   11/11/2019 -   Chief Complaint  Patient presents with   Follow-up    Pt states his breathing is progressively becoming worse and states he feels like he may be in a flare with the COPD. Pt states he is coughing up yellow-green phlegm and also has complaints of wheezing.   Advance COPD patient with recurrent COPD exacerbations.  HPI ORAZIO WELLER II 76 y.o. -presents for follow-up.  He says last month he saw [name] nurse practitioner.  He says he was given a different antibiotic that really helped.  Review of the records indicate one time it was Augmentin another time it was [place].  He believes the second 1 really helped.  He feels in another exacerbation.  He is having green sputum.  He says with the flutter valve and exacerbation a lot of sputum comes out but between exacerbations is very minimal.  He is frustrated by his  recurrent exacerbations.  He did not tolerate Trelegy in the past.  He is on Spiriva, Symbicort, oxygen 3 L, flutter valve, Flonase, Roflumilast and 3 times a week of azithromycin.  He is looking to see if he can improve his quality of life.  His COPD CAT score is 34.  His last CT scan of the chest was in 2019.     CAT Score 11/11/2019 03/05/2019 04/03/2018  Total CAT Score 32 30 30     OV 11/26/2019 - telephone visit. 2PHI identified. Risks, benefit, limitation explained  Subjective:  Patient ID: Marcelyn Bruins, male , DOB: 08/17/1945 , age 41 y.o. , MRN: [id number] , ADDRESS: [place] [address] Advanced COPD with recurrent exacerbation now colonized by pseudomonas  11/26/2019 -    HPI CASEY FYE II 76 y.o. -last seen by myself Nov 11, 2019.  At that time we decided to do a work-up for recurrent exacerbations.  His immunoglobulin levels are normal.  His sputum grew Pseudomonas that is pansensitive.  He CT scan of the chest shows right upper lobe worsening fibrotic pattern.  There is no mass per se.  I personally visualized the CT chest.  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  Today in the visit he tells me after 3 days he had nausea and then he had joint pain on the third day and quit taking it.  He still continues to be symptomatic.     ROS - per HPI  CT Chest Wo Contrast  Result Date: 11/25/2019 CLINICAL DATA:  COPD exacerbation. Severe shortness of breath. EXAM: CT CHEST WITHOUT CONTRAST TECHNIQUE: Multidetector CT imaging of the chest was performed following the standard protocol without IV contrast. COMPARISON:  04/07/2018. FINDINGS: Cardiovascular: The heart size is normal. There is no pericardial effusion. Aortic atherosclerosis. Three vessel coronary artery atherosclerotic calcifications identified Mediastinum/Nodes: No enlarged mediastinal or axillary  lymph nodes. Thyroid gland, trachea, and esophagus demonstrate no significant findings. Lungs/Pleura: No pleural effusion identified. Advanced changes of centrilobular and paraseptal emphysema with mild bullous changes. Progressive confluent fibrotic with extensive architectural distortion and volume loss no acute airspace consolidation, atelectasis or pneumothorax. Scattered, bilateral areas of pleuroparenchymal scarring identified changes within the apical portions of the right upper lobe are again noted. Upper Abdomen: Bilateral low-attenuation adrenal nodules appears similar to previous exam and are compatible with benign  adenomas. No acute abnormality identified within the imaged portions of the upper abdomen. Musculoskeletal: Mild scoliosis and degenerative disc disease noted within the thoracic spine. Stable chronic compression deformities at T6, T7 and T8. No new compression fractures. IMPRESSION: 1. No acute cardiopulmonary abnormalities. 2. Progressive confluent fibrotic changes within the right upper lobe with extensive architectural distortion and volume loss. Likely the sequelae of prior inflammation/infection. 3. Aortic atherosclerosis, in addition to 3 vessel coronary artery disease. Please note that although the presence of coronary artery calcium documents the presence of coronary artery disease, the severity of this disease and any potential stenosis cannot be assessed on this non-gated CT examination. Assessment for potential risk factor modification, dietary therapy or pharmacologic therapy may be warranted, if clinically indicated. 4. Bilateral adrenal adenomas. 5. Stable chronic compression deformities at T6, T7 and T8. 6. Diffuse bronchial wall thickening with emphysema, as above; imaging findings suggestive of underlying COPD. Aortic Atherosclerosis (ICD10-I70.0) and Emphysema (ICD10-J43.9). Electronically Signed   By: Kerby Moors M.D.   On: 11/25/2019 12:21     OV  05/03/2020  Subjective:  Patient ID: Marcelyn Bruins, male , DOB: 1945/12/01 , age 35 y.o. , MRN: [id number] , ADDRESS: [place] [address] PCP [name], MD Patient Care Team: [name], MD as PCP - General (Family Medicine) Brand Males, MD as Consulting Physician (Pulmonary Disease) Danella Sensing, MD as Consulting Physician (Dermatology) Dr. Henreitta Leber, DDS (Dentistry)  This Provider for this visit: Treatment Team:  Attending Provider: Brand Males, MD    05/03/2020 -   Chief Complaint  Patient presents with   Follow-up    doing best he has in a year and a half.     ICD-10-CM   1. Stage 4 very severe COPD by GOLD classification ([place])  J44.9   2. Chronic respiratory failure with hypoxia (HCC)  J96.11   3. COPD, frequent exacerbations ([place])  J44.1   4. Abnormal findings on diagnostic imaging of lung  R91.8   5. Pulmonary embolism, unspecified chronicity, unspecified pulmonary embolism type, unspecified whether acute cor pulmonale present (HCC)  I26.99   6. Vaccine counseling  Z71.85      HPI CARLIE CORPUS II 76 y.o. -presents with his wife for the above issues.  He said his Covid booster has had his flu shot.  He has follow-up coming end of November with primary care physician.  He wants me to send a note to the primary care physician.  He is without any exacerbations.  He continues prednisone, azithromycin, Roflumilast, Spiriva and Symbicort.  He does not want to do triple inhaler therapies.  He is on anticoagulation.  He says after the GI bleeding issues resolved he is better.  He had a CT scan of the chest that is documented below.  His infiltrates are improving but there is mucus/debris in the main airway he is denying any aspiration.  He does have hiatal hernia.  Apparently this was discovered during endoscopy.  CAT Score 05/03/2020 11/11/2019 03/05/2019 04/03/2018  Total CAT Score 16 32 30 30     IMPRESSION: 1. Trace right  pleural effusion and associated atelectasis or consolidation, both of which are improved compared to prior examination. Findings are generally consistent with resolving sequelae of prior infection or aspiration. 2. Diffuse bilateral bronchial wall thickening consistent with nonspecific infectious or inflammatory bronchitis. 3. Frothy debris in the bilateral mainstem bronchi, concerning for aspiration. 4. Severe centrilobular emphysema. Emphysema (ICD10-J43.9). 5. Nonspecific fibrotic  scarring of the right greater than left lung apices, likely sequelae of prior infection or inflammation. 6. Coronary artery disease.  Aortic Atherosclerosis (ICD10-I70.0).     Electronically Signed   By: Eddie Candle M.D.   On: 04/26/2020 13:42   ROS - per HPI  OV 08/31/2020  Subjective:  Patient ID: Marcelyn Bruins, male , DOB: 11/11/1945 , age 45 y.o. , MRN: [id number] , ADDRESS: [place] [address] PCP [name], MD Patient Care Team: [name], MD as PCP - General (Family Medicine) Brand Males, MD as Consulting Physician (Pulmonary Disease) Danella Sensing, MD as Consulting Physician (Dermatology) Dr. Henreitta Leber, DDS (Dentistry)  This Provider for this visit: Treatment Team:  Attending Provider: Brand Males, MD    08/31/2020 -   Chief Complaint  Patient presents with   Follow-up    More congestion at the moment, more SOB   Advanced end-stage COPD on oxygen and ECOG 3-4 History of pulmonary embolism on anticoagulation  HPI Lesia Sago II 76 y.o. -presents for follow-up.  Presents with his wife he sitting on wheelchair.  He says for the last few weeks he is having a flareup with increased urine production that is green.  He wants antibiotics.  He says in December 2021 I switched his Omnicef to ciprofloxacin based on Pseudomonas and sensitivity but he actually felt the [place] worked better.  However he said that the duration again was too  short and he actually wants Omnicef again for 14 days at this time.  He also thinks a prednisone taper/burst would help him.  Otherwise he continues on Spiriva Symbicort Daliresp 3 times weekly azithromycin and daily prednisone 10 mg.  Is also on oxygen.  He is on anticoagulation for his pulmonary embolism.  He fell down and broke his back but did not have any bleeding.  He is going to have an MRI.  Noted that he is not on any Mucinex and is going to start this after my advice.  We discussed about switching his Breo and Symbicort to a triple inhaler combination either Trelegy or BREZTRI but he says both of not working he does not want to do it.   OV 04/26/2021  Subjective:  Patient ID: Marcelyn Bruins, male , DOB: 09/17/1945 , age 24 y.o. , MRN: [id number] , ADDRESS: [address] [place] [address] PCP [name], MD Patient Care Team: [name], MD as PCP - General (Family Medicine) Brand Males, MD as Consulting Physician (Pulmonary Disease) Danella Sensing, MD as Consulting Physician (Dermatology) Dr. Henreitta Leber, DDS (Dentistry)  This Provider for this visit: Treatment Team:  Attending Provider: Brand Males, MD    04/26/2021 -   Chief Complaint  Patient presents with   Follow-up    Pt states he is about the same since last visit. Has problems with coughing and will get up occ white to yellow phlegm and also has had some wheezing.    Advanced end-stage COPD on oxygen and ECOG 3-4 - last CT Oct 2021 History of pulmonary embolism on anticoagulation - June 2021 Iron def anemia - follows Dr Julien Nordmann  HPI Lesia Sago II 76 y.o. -returns for follow-up with his wife.  His ECOG continues to be 3-4.  He is wheelchair and minimal activities.  He is on oxygen, Spiriva, Symbicort, azithromycin for preventing COPD exacerbations, Daliresp, daily prednisone.  Is not using 3% saline nebulizer.  Instead he says flutter valve  works well for him.  Last seen in  February 2022 and since then no exacerbations.  The longest time he has had without exacerbation he is happy about it.  He has some waxing and waning sputum production.  Few days ago it was yellow but now clear.  He is going to monitor this.  He is up-to-date with his COVID vaccines.  He is no longer following with Dr. Julien Nordmann for his iron deficiency anemia.  He is on anticoagulation for his PE in June 2021.    CT Chest data  No results found.    PFT  No flowsheet data found.  OV 07/26/2021  Subjective:  Patient ID: Marcelyn Bruins, male , DOB: May 28, 1946 , age 88 y.o. , MRN: [id number] , ADDRESS: [address] [place] [address] PCP [name], MD Patient Care Team: [name], MD as PCP - General (Family Medicine) Brand Males, MD as Consulting Physician (Pulmonary Disease) Danella Sensing, MD as Consulting Physician (Dermatology) Dr. Henreitta Leber, DDS (Dentistry)  This Provider for this visit: Treatment Team:  Attending Provider: Brand Males, MD    07/26/2021 -   Chief Complaint  Patient presents with   Follow-up    Pt is here to discuss results of recent CT.    Advanced end-stage COPD on oxygen and ECOG 3-4 - last CT Oct 2021 History of pulmonary embolism on anticoagulation - June 2021 Iron def anemia - follows Dr Julien Nordmann Right lower lobe superior segment nodule - 9 m -January 2023 new onset  HPI ENGLISH CRAIGHEAD II 76 y.o. -follows for his COPD.  Presents with his wife.  He continues on his oxygen at 4 L.  He feels he is beginning to have a COPD exacerbation.  He did have a CT scan of the chest that was routine.  It shows a 9 mm right lower lobe superior segment nodule.  I looked at it it looks solid and lobulated.  I think there is a fair probability greater than 65% this is malignancy.  He is asymptomatic from it.  He just wants preemptive antibiotics and prednisone burst for his incipient COPD exacerbation.  In terms of his pulmonary  embolism from 2 and half years ago he continues his Eliquis.  The Eliquis at low-dose for prophylaxis.  His D-dimer in the past has been normal.  But we decided to continue the low-dose Eliquis.  We will check his D-dimer again.    CAT Score 04/26/2021 08/31/2020 05/03/2020 11/11/2019  Total CAT Score _0 32        CT chest 07/10/21  Narrative & Impression  CLINICAL DATA:  COPD exacerbation. Respiratory failure with hypoxia.   EXAM: CT CHEST WITHOUT CONTRAST   TECHNIQUE: Multidetector CT imaging of the chest was performed following the standard protocol without IV contrast.   COMPARISON:  04/25/2020   FINDINGS: Cardiovascular: The heart size is within normal limits. No pericardial effusion. Aortic atherosclerosis and coronary artery calcifications.   Mediastinum/Nodes: No enlarged mediastinal or axillary lymph nodes. Thyroid gland, trachea, and esophagus demonstrate no significant findings.   Lungs/Pleura: Severe changes of centrilobular and paraseptal emphysema. Diffuse bronchial wall thickening. No pleural effusion or acute airspace consolidation. No interstitial edema. Scarring and masslike architectural distortion within the right apex appears unchanged from the previous exam. Within the superior segment of right lower lobe there is a lung nodule measuring 1.1 x 0.6 cm (mean diameter 9 mm), image 82/3. New compared with the previous exam.  Upper Abdomen: No acute findings within the imaged portions of the upper abdomen. Aortic atherosclerosis.   Musculoskeletal: Curvature of the thoracic spine is convex towards the right. Compression fractures are again noted involving T6, T7 and T8. Unchanged from previous exam.   IMPRESSION: 1. No acute cardiopulmonary abnormalities. 2. There is a new pulmonary nodule within the superior segment of right lower lobe with a mean diameter of 9 mm. Consider a non-contrast Chest CT at 3 months, a PET/CT, or tissue  sampling. These guidelines do not apply to immunocompromised patients and patients with cancer. Follow up in patients with significant comorbidities as clinically warranted. For lung cancer screening, adhere to Lung-RADS guidelines. Reference: Radiology. 2017; 284(1):228-43. 3. Stable scarring and masslike architectural distortion within the right apex. 4. Diffuse bronchial wall thickening with emphysema, as above; imaging findings suggestive of underlying COPD. 5. Stable thoracic compression fractures. 6. Aortic Atherosclerosis (ICD10-I70.0) and Emphysema (ICD10-J43.9).     Electronically Signed   By: Kerby Moors M.D.   On: 07/10/2021 13:33       09/11/2021 Pt. Presents for follow up. He had Flexible video fiberoptic bronchoscopy with robotic assistance and biopsies on 09/04/2021. He states he has done well after his biopsy. No significant bleeding. Just a scan amount the day after. He did have a lot of rib pain , that has subsequently improved. I gave the patient the biopsy results. We discussed this in full to include treatment options and all questions were asked and answered. [name] Kitchen   He says he is currently in the middle of a COPD flare. He has thick yellow green secretions .He also endorses a bad cough.  He does have a flutter valve. He does endorse some wheezing.He resumed his Eliquis on 09/06/2021.We will treat him for his COPD flare today.He is compliant with his Daliresp, Spiriva and Symbicort.    I have referred to radiation oncology, as he is oxygen dependent and in a wheelchair, SBRT is best option for this single nodule. . Both he and his wife had  questions that were answered. I have asked them to call the office if they need anything at all.   Test Results: Cytology 09/04/2021 MICROSCOPIC DIAGNOSIS:  A. 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  Immunohistochemistry shows  patchy positivity with cytokeratin 5/6 and weak positivity with CD56.  The malignant cells are negative with TTF-1, chromogranin,  synaptophysin, p40 and p63.  Ki-67 shows a high proliferation rate.   PET scan 08/08/2021 Solitary hypermetabolic pulmonary nodule in the RIGHT lower lobe with intense metabolic activity is most consistent PRIMARY BRONCHOGENIC CARCINOMA. 2. No mediastinal adenopathy, supraclavicular adenopathy or distant metastatic disease. No skeletal metastasis.     OV 12/05/2021  Subjective:  Patient ID: Marcelyn Bruins, male , DOB: [birth year] , age 76 y.o. , MRN: [id number] , ADDRESS: [address] [place] [id number] PCP [name], MD Patient Care Team: [name], MD as PCP - General (Family Medicine) Brand Males, MD as Consulting Physician (Pulmonary Disease) Danella Sensing, MD as Consulting Physician (Dermatology) Dr. Henreitta Leber, DDS (Dentistry)  This Provider for this visit: Treatment Team:  Attending Provider: Brand Males, MD    12/05/2021 -   Chief Complaint  Patient presents  with   Follow-up    Pt states since his last radiation treatment, his breathing has become a little worse. States his cough is about the same.   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 Valeta Harms  -Status post SBRT by Dr. Lisbeth Renshaw April 2023  HPI Lesia Sago II 76 y.o. -return to see me.  I presents with his wife.  I am seeing him for the first time after the diagnose of cancer.  He said radiation 3  times.  He says after the radiation he is a little bit more short of breath.  He is now needing 4 L nasal cannula.  When he exerts he can desaturate into the 80s and sometimes into the 70s.  He is able to recover with resting.  His amount of cough and sputum is around the same.  It is between clear and green and yellow.  He is not in a COPD exacerbation currently but according to his wife it appears that he might get one by next week.  She says whenever the antibiotic cephalosporin is on and prednisone he is doing great but otherwise he will have a flareup.  He is already on azithromycin 3 times a week and also Roflumilast for prevention of COPD flareup.  Despite this he gets recurrent COPD flareup.  Reviewed his eosinophilic profile.  Prior to prednisone some of the eosinophils were as high as 300 cells per cubic millimeter.  We did discuss about the fact that [place] might be effective but is eosinophilic count can be obliterated by the prednisone.  He is willing to try this if he has eosinophilia or if you are able to get insurance justification for COPD/asthma overlap.  He does behave like COPD and asthma overlap.  Currently his medications include Symbicort, Spiriva, Daliresp, azithromycin, Flonase and oxygen  He and his wife are requesting a prescription for COPD exacerbation be sent   He has upcoming appointment Dr. Julien Nordmann.  He does not think he will be a good candidate for chemotherapy CT Chest data  No results found.    PFT      View : No data to display.             has a past medical history of Chronic back pain, Chronic rhinitis, Compressed spine fracture ([place]), COPD (chronic obstructive pulmonary disease) ([place]), Dyspnea, Emphysema lung ([place]), On home oxygen therapy, Onychomycosis, Orthostatic hypotension, PAD (peripheral artery disease) ([place]), Pneumonia, Pulmonary embolism ([place]) (04/07/2018), Skin cancer, Small cell lung cancer, right ([place]) (2023), and Vertigo.   reports  that he quit smoking about 4 years ago. His smoking use included pipe and cigarettes. He started smoking about 56 years ago. He has a 104.00 pack-year smoking history. He has never used smokeless tobacco.  Past Surgical History:  Procedure Laterality Date   ABDOMINAL AORTOGRAM W/LOWER EXTREMITY N/A 01/21/2020   Procedure: ABDOMINAL AORTOGRAM W/LOWER EXTREMITY;  Surgeon: Elam Dutch, MD;  Location: [facility];  Service: Cardiovascular;  Laterality: N/A;   APPENDECTOMY     BIOPSY  02/20/2020   Procedure: BIOPSY;  Surgeon: Rush Landmark Telford Nab., MD;  Location: [facility];  Service:  Gastroenterology;;   BRONCHIAL BIOPSY  09/04/2021   Procedure: BRONCHIAL BIOPSIES;  Surgeon: Garner Nash, DO;  Location: [facility];  Service: Pulmonary;;   BRONCHIAL BRUSHINGS  09/04/2021   Procedure: BRONCHIAL BRUSHINGS;  Surgeon: Garner Nash, DO;  Location: [facility];  Service: Pulmonary;;   BRONCHIAL NEEDLE ASPIRATION BIOPSY  09/04/2021   Procedure: BRONCHIAL NEEDLE ASPIRATION BIOPSIES;  Surgeon: Garner Nash, DO;  Location: [facility];  Service: Pulmonary;;   CATARACT EXTRACTION, BILATERAL     ENDARTERECTOMY FEMORAL Right 04/19/2019   Procedure: ENDARTERECTOMY RIGHT COMMON FEMORAL;  Surgeon: Elam Dutch, MD;  [facility];  Service: Vascular;  Laterality: Right;   ENDARTERECTOMY FEMORAL Left 01/24/2020   Procedure: LEFT FEMORAL ENDARTERECTOMY WITH DACRON PATCH ANGIOPLASTY;  Surgeon: Elam Dutch, MD;  [facility];  Service: Vascular;  Laterality: Left;   ESOPHAGOGASTRODUODENOSCOPY (EGD) WITH PROPOFOL N/A 02/20/2020   Procedure: ESOPHAGOGASTRODUODENOSCOPY (EGD) WITH PROPOFOL;  Surgeon: Irving Copas., MD;  Location: [facility];  Service: Gastroenterology;  Laterality: N/A;   FEMORAL ENDARTERECTOMY Left 01/24/2020   FIDUCIAL MARKER PLACEMENT  09/04/2021   Procedure: FIDUCIAL MARKER PLACEMENT;  Surgeon: Garner Nash, DO;  Location: [facility];   Service: Pulmonary;;   HEMOSTASIS CLIP PLACEMENT  02/20/2020   Procedure: HEMOSTASIS CLIP PLACEMENT;  Surgeon: Irving Copas., MD;  Location: [facility];  Service: Gastroenterology;;   HOT HEMOSTASIS N/A 02/20/2020   Procedure: HOT HEMOSTASIS (ARGON PLASMA COAGULATION/BICAP);  Surgeon: Irving Copas., MD;  Location: [facility];  Service: Gastroenterology;  Laterality: N/A;   INSERTION OF ILIAC STENT Left 01/24/2020   Procedure: INSERTION OF VBX STENT 8X59 AND 8X39 LEFT COMMON ILIAC ARTERY. INSERTION OF INNOVA 7 X 60 INNOVA STENT LEFT EXTERNAL ILIAC ARTERY. ;  Surgeon: Elam Dutch, MD;  Location: [facility];  Service: Vascular;  Laterality: Left;   LOWER EXTREMITY ANGIOGRAPHY  12/11/2018   LOWER EXTREMITY ANGIOGRAPHY N/A 12/11/2018   Procedure: LOWER EXTREMITY ANGIOGRAPHY;  Surgeon: Elam Dutch, MD;  Location: [facility];  Service: Cardiovascular;  Laterality: N/A;   PATCH ANGIOPLASTY Right 04/19/2019   Procedure: Patch Angioplasty;  Surgeon: Elam Dutch, MD;  Location[facility];  Service: Vascular;  Laterality: Right;   PERIPHERAL VASCULAR INTERVENTION Right 12/11/2018   Procedure: PERIPHERAL VASCULAR INTERVENTION;  Surgeon: Elam Dutch, MD;  Location: [facility];  Service: Cardiovascular;  Laterality: Right;  Common Iliac    SKIN CANCER EXCISION     "lips, face, ears, arms" (04/07/2018)   TONSILLECTOMY     ULTRASOUND GUIDANCE FOR VASCULAR ACCESS Right 01/24/2020   Procedure: ULTRASOUND GUIDANCE FOR VASCULAR ACCESS;  Surgeon: Elam Dutch, MD;  Location: [facility];  Service: Vascular;  Laterality: Right;   VIDEO BRONCHOSCOPY WITH RADIAL ENDOBRONCHIAL ULTRASOUND  09/04/2021   Procedure: VIDEO BRONCHOSCOPY WITH RADIAL ENDOBRONCHIAL ULTRASOUND;  Surgeon: Garner Nash, DO;  Location: [facility];  Service: Pulmonary;;    Allergies  Allergen Reactions   Tape Other (See Comments)    SKIN IS VERY THIN, TEARS SKIN; CAN ONLY USE COBAN WRAPS DUE TO  CONDITION OF SKIN!!   Amoxicillin-Pot Clavulanate Other (See Comments)    Thrush/ Sore throat/Voice became hoarse   Ciprofloxacin Nausea Only    Sick on stomach, weak/tired   Levaquin [Levofloxacin] Other (See Comments)    hallucinations   Lipitor [Atorvastatin] Rash    Immunization History  Administered Date(s) Administered   Fluad Quad(high Dose 65+) 03/16/2019, 03/10/2020   Influenza Split 04/24/2012  Influenza Whole 05/08/2009, 04/08/2011   Influenza, High Dose Seasonal PF 05/01/2017, 03/31/2018, 04/17/2021   Influenza,inj,Quad PF,6+ Mos 04/27/2013, 04/15/2014, 04/24/2015   Influenza,inj,quad, With Preservative 05/01/2018   Influenza-Unspecified 05/13/2016   PFIZER Comirnaty(Gray Top)Covid-19 Tri-Sucrose Vaccine 11/24/2020   PFIZER(Purple Top)SARS-COV-2 Vaccination 07/30/2019, 08/20/2019, 04/07/2020   Pfizer Covid-19 Vaccine Bivalent Booster 22yr & up 03/16/2021   Pneumococcal Conjugate-13 02/25/2014   Pneumococcal Polysaccharide-23 08/15/2011, 12/19/2016   Pneumococcal-Unspecified 03/08/2017   Td 02/21/2010   Tdap 11/05/2017   Zoster, Live 01/29/2012    Family History  Problem Relation Age of Onset   Heart disease Mother        CABG in her 846s nonsmoker   Cancer Mother    Stroke Father    Heart disease Father        Died of MI at age 74 smoker   Hepatitis Sister    Coronary artery disease Other        male 1st degree relative <60   Colon cancer Neg Hx    Pancreatic cancer Neg Hx    Esophageal cancer Neg Hx    Inflammatory bowel disease Neg Hx    Liver disease Neg Hx    Rectal cancer Neg Hx    Stomach cancer Neg Hx      Current Outpatient Medications:    acetaminophen (TYLENOL) 500 MG tablet, Take 1,000 mg by mouth every 6 (six) hours as needed for moderate pain. , Disp: , Rfl:    albuterol (VENTOLIN HFA) 108 (90 Base) MCG/ACT inhaler, USE 2 PUFFS EVERY 6 HOURS  AS NEEDED FOR WHEEZING, Disp: 18 g, Rfl: 3   Calcium Carbonate-Vitamin D (CALCIUM 600+D PO),  Take 2 tablets by mouth daily., Disp: , Rfl:    cefdinir (OMNICEF) 300 MG capsule, Take 1 capsule (300 mg total) by mouth 2 (two) times daily., Disp: 10 capsule, Rfl: 0   ELIQUIS 2.5 MG TABS tablet, TAKE 1 TABLET BY MOUTH  TWICE DAILY, Disp: 180 tablet, Rfl: 3   Ferrous Sulfate (IRON) 325 (65 Fe) MG TABS, Take 325 mg by mouth daily., Disp: , Rfl:    fluticasone (FLONASE) 50 MCG/ACT nasal spray, Place 2 sprays into both nostrils daily., Disp: 48 g, Rfl: 3   folic acid (FOLVITE) 1 MG tablet, Take 1 tablet (1 mg total) by mouth daily., Disp: , Rfl:    furosemide (LASIX) 20 MG tablet, TAKE 1 TABLET (20 MG TOTAL) BY MOUTH DAILY AS NEEDED FOR FLUID OR EDEMA. (Patient taking differently: Take 20 mg by mouth daily.), Disp: 90 tablet, Rfl: 3   magic mouthwash (nystatin, lidocaine, diphenhydrAMINE, alum & mag hydroxide) suspension, Swish and spit 5 mLs 4 (four) times daily., Disp: 180 mL, Rfl: 1   magic mouthwash SOLN, Take 5 mLs by mouth 3 (three) times daily as needed for mouth pain., Disp: 150 mL, Rfl: 0   Multiple Vitamin (MULTIVITAMIN WITH MINERALS) TABS tablet, Take 1 tablet by mouth daily., Disp: , Rfl:    mupirocin cream (BACTROBAN) 2 %, Apply 1 application topically 2 (two) times daily. (Patient taking differently: Apply 1 application. topically daily as needed (Cyst).), Disp: 22 g, Rfl: 0   oxyCODONE (OXY IR/ROXICODONE) 5 MG immediate release tablet, Take 1 tablet (5 mg total) by mouth every 4 (four) hours as needed for severe pain., Disp: 60 tablet, Rfl: 0   OXYGEN, Inhale 4-5 L into the lungs continuous. , Disp: , Rfl:    pantoprazole (PROTONIX) 20 MG tablet, Take 1 tablet (20 mg total) by mouth daily.,  Disp: 90 tablet, Rfl: 1   Polyethyl Glycol-Propyl Glycol (SYSTANE) 0.4-0.3 % SOLN, Place 1 drop into both eyes every other day., Disp: , Rfl:    predniSONE (DELTASONE) 10 MG tablet, Take 4 tablets (40 mg total) by mouth daily with breakfast for 2 days, THEN 2 tablets (20 mg total) daily with  breakfast for 2 days, THEN 1 tablet (10 mg total) daily with breakfast for 2 days., Disp: 14 tablet, Rfl: 0   Respiratory Therapy Supplies (FLUTTER) DEVI, 10 times Twice a day and prn as needed, may increase if feeling worse, Disp: 1 each, Rfl: 0   roflumilast (DALIRESP) 500 MCG TABS tablet, TAKE 1 TABLET BY MOUTH DAILY, Disp: 90 tablet, Rfl: 3   rosuvastatin (CRESTOR) 10 MG tablet, TAKE 1 TABLET BY MOUTH  DAILY, Disp: 90 tablet, Rfl: 3   SPIRIVA RESPIMAT 2.5 MCG/ACT AERS, USE 2 INHALATIONS BY MOUTH  DAILY, Disp: 12 g, Rfl: 3   SYMBICORT 160-4.5 MCG/ACT inhaler, USE 2 INHALATIONS BY MOUTH  TWICE DAILY, Disp: 30.6 g, Rfl: 3      Objective:   Vitals:   12/05/21 1101  BP: 112/70  Pulse: (!) 103  SpO2: 92%  Weight: 174 lb 9.6 oz (79.2 kg)  Height: 6' (1.829 m)    Estimated body mass index is 23.68 kg/m as calculated from the following:   Height as of this encounter: 6' (1.829 m).   Weight as of this encounter: 174 lb 9.6 oz (79.2 kg).  _0 @  Filed Weights   12/05/21 1101  Weight: 174 lb 9.6 oz (79.2 kg)     Physical Exam General: No distress.  Frail cushingoid male sitting on the wheelchair oxygen on Neuro: Alert and Oriented x 3. GCS 15. Speech normal Psych: Pleasant Resp:  Barrel Chest - yes.  Wheeze - no, Crackles - no, No overt respiratory distress CVS: Normal heart sounds. Murmurs - no Ext: Stigmata of Connective Tissue Disease -chronic venous stasis edema with purplish skin HEENT: Normal upper airway. PEERL +. No post nasal drip        Assessment:       ICD-10-CM   1. COPD, frequent exacerbations ([place])  J44.1 Pulmonary function test    CBC with Differential/Platelet    IgE    2. Stage 4 very severe COPD by GOLD classification ([place])  J44.9 Pulmonary function test    CBC with Differential/Platelet    IgE         Plan:     Patient Instructions  Nodule of lower lobe of right lung - 80m new Jul 10, 2021. Diagnosed As small cell lung cancer and s/p  XRT April 2023  Plan - per DR MJulien Nordmannand MLisbeth Renshaw- CT scan timing  COPD with acute exacerbation ([facility] COPD, frequent exacerbations ([place]  - no flare up but could be at risk in future few days  Plan - check ABG -(last checked 2021) - if CO2 up then bipap night will be bneficial - check CBC with diff and blood IgE 12/05/2021 - if eos high - can consider dupixemnt - Keep handly - send to [facility] - omnicef 309mtwice daily  5 days  -  Take prednisone 40 mg daily x 2 days, then 2075maily x 2 days, then 12m16mily x continue baseline dose - continue  daily prednisone 12mg21mithromycin MWF, and roflumilast daily per routine   Stage 4 very severe COPD by GOLD classification (HCC) [place] respiratory failure with hypoxia (HCC) [place]  --Continue  oxygen, Spiriva, Symbicort, Daliresp and 3 times weekly azithromycin and daily prednisone otherwise   History Pulmonary embolism -   -normal d-dimer Jan 2023  Plan  - - continue low dose eliquis for now esp with cancer and leg edema    Follow-up -3 months with dR Bekim Werntz  ( Level 05 visit: Estb 40-54 min  in  visit type: on-site physical face to visit  in total care time and counseling or/and coordination of care by this undersigned MD - Dr Brand Males. This includes one or more of the following on this same day 12/05/2021: pre-charting, chart review, note writing, documentation discussion of test results, diagnostic or treatment recommendations, prognosis, risks and benefits of management options, instructions, education, compliance or risk-factor reduction. It excludes time spent by the [place] or office staff in the care of the patient. Actual time 40 min)    SIGNATURE    Dr. Brand Males, M.D., F.C.C.P,  Pulmonary and Critical Care Medicine Staff Physician, [place] Director - Interstitial Lung Disease  Program  Pulmonary [place] [facility] [place], [place],  [id number]  Pager: [phone], If no answer or between  15:00h - 7:00h: call [phone] Telephone: [phone]  4:48 PM 12/05/2021

## 2021-12-05 NOTE — Patient Instructions (Addendum)
Nodule of lower lobe of right lung - 43mm new Jul 10, 2021. 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  Plan - per DR Julien Nordmann and Lisbeth Renshaw - CT scan timing  COPD with acute exacerbation [facility]) COPD, frequent exacerbations ([place])  - no flare up but could be at risk in future few days  Plan - check ABG -(last checked 2021) - if CO2 up then bipap night will be bneficial - check CBC with diff and blood IgE 12/05/2021 - if eos high - can consider dupixemnt - Keep handly - send to [facility] - omnicef 300mg  twice daily  5 days  -  Take prednisone 40 mg daily x 2 days, then 20mg  daily x 2 days, then 10mg  daily x continue baseline dose - continue  daily prednisone 10mg , azithromycin MWF, and roflumilast daily per routine   Stage 4 very severe COPD by GOLD classification ([place]) Chronic respiratory failure with hypoxia ([place])  Plan  --Continue oxygen, Spiriva, Symbicort, Daliresp and 3 times weekly azithromycin and daily prednisone otherwise   History Pulmonary embolism -   -normal d-dimer Jan 2023  Plan  - - continue low dose eliquis for now esp with cancer and leg edema    Follow-up -3 months with dR [name]

## 2021-12-10 ENCOUNTER — Encounter: Payer: Self-pay | Admitting: Family Medicine

## 2021-12-10 ENCOUNTER — Ambulatory Visit (INDEPENDENT_AMBULATORY_CARE_PROVIDER_SITE_OTHER): Payer: Medicare Other | Admitting: Family Medicine

## 2021-12-10 ENCOUNTER — Telehealth: Payer: Self-pay | Admitting: Internal Medicine

## 2021-12-10 ENCOUNTER — Ambulatory Visit
Admission: RE | Admit: 2021-12-10 | Discharge: 2021-12-10 | Disposition: A | Discharge status: Home / Self Care | Payer: Medicare Other | Source: Ambulatory Visit | Attending: Radiation Oncology | Admitting: Radiation Oncology

## 2021-12-10 VITALS — BP 130/70 | HR 100 | Temp 98.4°F | Ht 72.0 in | Wt 174.0 lb

## 2021-12-10 DIAGNOSIS — I10 Essential (primary) hypertension: Secondary | ICD-10-CM | POA: Diagnosis not present

## 2021-12-10 DIAGNOSIS — C3431 Malignant neoplasm of lower lobe, right bronchus or lung: Secondary | ICD-10-CM

## 2021-12-10 DIAGNOSIS — Z0000 Encounter for general adult medical examination without abnormal findings: Secondary | ICD-10-CM

## 2021-12-10 DIAGNOSIS — J449 Chronic obstructive pulmonary disease, unspecified: Secondary | ICD-10-CM

## 2021-12-10 DIAGNOSIS — R739 Hyperglycemia, unspecified: Secondary | ICD-10-CM | POA: Diagnosis not present

## 2021-12-10 DIAGNOSIS — D500 Iron deficiency anemia secondary to blood loss (chronic): Secondary | ICD-10-CM

## 2021-12-10 DIAGNOSIS — I2782 Chronic pulmonary embolism: Secondary | ICD-10-CM

## 2021-12-10 DIAGNOSIS — E785 Hyperlipidemia, unspecified: Secondary | ICD-10-CM | POA: Diagnosis not present

## 2021-12-10 DIAGNOSIS — E559 Vitamin D deficiency, unspecified: Secondary | ICD-10-CM | POA: Diagnosis not present

## 2021-12-10 DIAGNOSIS — I5022 Chronic systolic (congestive) heart failure: Secondary | ICD-10-CM

## 2021-12-10 LAB — CBC WITH DIFFERENTIAL/PLATELET
Basophils Absolute: 0 10*3/uL (ref 0.0–0.1)
Basophils Relative: 0.3 % (ref 0.0–3.0)
Eosinophils Absolute: 0.2 10*3/uL (ref 0.0–0.7)
Eosinophils Relative: 2 % (ref 0.0–5.0)
HCT: 36.9 % — ABNORMAL LOW (ref 39.0–52.0)
Hemoglobin: 12.6 g/dL — ABNORMAL LOW (ref 13.0–17.0)
Lymphocytes Relative: 10.3 % — ABNORMAL LOW (ref 12.0–46.0)
Lymphs Abs: 1.1 10*3/uL (ref 0.7–4.0)
MCHC: 34.1 g/dL (ref 30.0–36.0)
MCV: 96.7 fl (ref 78.0–100.0)
Monocytes Absolute: 1.1 10*3/uL — ABNORMAL HIGH (ref 0.1–1.0)
Monocytes Relative: 10.4 % (ref 3.0–12.0)
Neutro Abs: 8 10*3/uL — ABNORMAL HIGH (ref 1.4–7.7)
Neutrophils Relative %: 77 % (ref 43.0–77.0)
Platelets: 236 10*3/uL (ref 150.0–400.0)
RBC: 3.82 Mil/uL — ABNORMAL LOW (ref 4.22–5.81)
RDW: 14.3 % (ref 11.5–15.5)
WBC: 10.3 10*3/uL (ref 4.0–10.5)

## 2021-12-10 LAB — LIPID PANEL
Cholesterol: 212 mg/dL — ABNORMAL HIGH (ref 0–200)
HDL: 109.7 mg/dL (ref >39.00)
LDL Cholesterol: 86 mg/dL (ref 0–99)
NonHDL: 101.9
Total CHOL/HDL Ratio: 2
Triglycerides: 78 mg/dL (ref 0.0–149.0)
VLDL: 15.6 mg/dL (ref 0.0–40.0)

## 2021-12-10 LAB — IBC + FERRITIN
Ferritin: 35.5 ng/mL (ref 22.0–322.0)
Iron: 74 ug/dL (ref 42–165)
Saturation Ratios: 17.8 % — ABNORMAL LOW (ref 20.0–50.0)
TIBC: 415.8 ug/dL (ref 250.0–450.0)
Transferrin: 297 mg/dL (ref 212.0–360.0)

## 2021-12-10 LAB — COMPREHENSIVE METABOLIC PANEL
ALT: 16 U/L (ref 0–53)
AST: 21 U/L (ref 0–37)
Albumin: 4 g/dL (ref 3.5–5.2)
Alkaline Phosphatase: 60 U/L (ref 39–117)
BUN: 15 mg/dL (ref 6–23)
CO2: 32 mEq/L (ref 19–32)
Calcium: 10 mg/dL (ref 8.4–10.5)
Chloride: 98 mEq/L (ref 96–112)
Creatinine, Ser: 1.17 mg/dL (ref 0.40–1.50)
GFR: 61.53 mL/min (ref >60.00)
Glucose, Bld: 95 mg/dL (ref 70–99)
Potassium: 4.6 mEq/L (ref 3.5–5.1)
Sodium: 141 mEq/L (ref 135–145)
Total Bilirubin: 0.7 mg/dL (ref 0.2–1.2)
Total Protein: 6.7 g/dL (ref 6.0–8.3)

## 2021-12-10 LAB — VITAMIN D 25 HYDROXY (VIT D DEFICIENCY, FRACTURES): VITD: >120 ng/mL

## 2021-12-10 LAB — HEMOGLOBIN A1C: Hgb A1c MFr Bld: 5.2 % (ref 4.6–6.5)

## 2021-12-10 MED ORDER — TRIAMCINOLONE ACETONIDE 0.1 % EX CREA: 1.0000 "application " | TOPICAL_CREAM | Freq: Two times a day (BID) | CUTANEOUS | 0 refills | Status: DC

## 2021-12-10 NOTE — Patient Instructions (Addendum)
Please check with your pharmacy to see if they have the shingrix vaccine. If they do- please get this immunization and update [place] by phone call or mychart with dates you receive the vaccine  Please stop by lab before you go If you have mychart- we will send your results within 3 business days of [place] receiving them.  If you do not have mychart- we will call you about results within 5 business days of [place] receiving them.  *please also note that you will see labs on mychart as soon as they post. I will later go in and write notes on them- will say "notes from Dr. Yong Channel"   Recommended follow up: Return in about 1 year (around 12/11/2022) for physical or sooner if needed.Schedule b4 you leave.

## 2021-12-10 NOTE — Telephone Encounter (Signed)
   His blood eos did not reach the required 300 cells/cumm. It is at 200 cell/cumm. So, dupixent wont be an option for him at this point

## 2021-12-10 NOTE — Progress Notes (Signed)
Phone: [phone]   Subjective:  Patient presents today for their annual physical. Chief complaint-noted.   See problem oriented charting- Review of Systems  Constitutional:  Positive for malaise/fatigue. Negative for chills and fever.  HENT:  Positive for congestion (some- uses flonase) and hearing loss.   Eyes:  Negative for blurred vision and double vision.  Respiratory:  Positive for cough, sputum production, shortness of breath and wheezing.   Cardiovascular:  Negative for chest pain and palpitations.  Gastrointestinal:  Negative for blood in stool, constipation, diarrhea, heartburn, melena (darker stool with iron), nausea and vomiting.  Genitourinary:  Positive for frequency (on lasix). Negative for dysuria.  Musculoskeletal:  Positive for back pain and joint pain.  Skin:  Negative for itching and rash.  Neurological:  Negative for dizziness and headaches.  Endo/Heme/Allergies:  Negative for polydipsia. Bruises/bleeds easily.  Psychiatric/Behavioral:  Negative for depression and suicidal ideas.  -   The following were reviewed and entered/updated in epic: Past Medical History:  Diagnosis Date   Chronic back pain    "mid and lower" (04/07/2018)   Chronic rhinitis    -Sinus Ct 08/01/2009 >> Bilateral maxillary sinusitis with some mucosal thickeningin the sphenoid and frontal sinuses as well with air fluid levels present -chronic rhinitis flyer Aug 04, 2009   Compressed spine fracture [facility])    COPD (chronic obstructive pulmonary disease) (HCC)    PFT's rec Jul 17, 2009   Dyspnea    Emphysema lung ([place])    On home oxygen therapy    "3L; 24/7" (04/07/2018)   Onychomycosis    Dr. Judi Cong   Orthostatic hypotension    "since 10/2017" (04/07/2018)   PAD (peripheral artery disease) ([place])    Pneumonia    "twice in 1 year" (04/07/2018)   Pulmonary embolism ([place]) 04/07/2018   Skin cancer    "lips, face, ears, arms" (04/07/2018)   Small cell lung cancer, right ([place]) 2023   Dr. Lisbeth Renshaw.   rad   Vertigo    "since ~ 02/2018" (04/07/2018)   Patient Active Problem List   Diagnosis Date Noted   Malignant neoplasm of lower lobe of right lung ([place])     Priority: High   Acute GI bleeding 02/18/2020    Priority: High   PAD (peripheral artery disease) ([place]) 12/11/2018    Priority: High   Stage 4 very severe COPD by GOLD classification ([place]) 11/27/2018    Priority: High   DNR (do not resuscitate) 04/21/2018    Priority: High   Chronic pulmonary embolism ([place]) 04/07/2018    Priority: High   Osteoporosis 11/19/2017    Priority: High   Thoracic compression fracture ([place]) 11/02/2017    Priority: High   Former smoker 08/23/2008    Priority: High   Iron deficiency anemia due to chronic blood loss 08/23/2020    Priority: Medium    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    Priority: Medium    History of upper GI bleeding 04/30/2020    Priority: Medium    Essential tremor 03/31/2018    Priority: Medium    BPPV (benign paroxysmal positional vertigo), right 02/10/2018    Priority: Medium    Essential hypertension 11/02/2017    Priority: Medium    BPH associated with nocturia 06/25/2017    Priority: Medium    Hyperglycemia 06/12/2016    Priority: Medium    Hyperlipidemia 07/22/2014    Priority: Medium    Pre-operative respiratory examination 12/10/2018    Priority: Low   Venous stasis dermatitis  of both lower extremities 11/02/2017    Priority: Low   Chronic respiratory failure with hypoxia ([place]) 09/23/2017    Priority: Low   Leukocytosis 12/19/2016    Priority: Low   Onychomycosis 07/22/2014    Priority: Low   Left ankle swelling 07/22/2014    Priority: Low   History of skin cancer 05/17/2014    Priority: Low   Chronic rhinitis 10/28/2012    Priority: Low   Pulmonary nodule 09/23/2011    Priority: Low   History of colonic polyps 08/23/2008    Priority: Low   Chronic systolic congestive heart failure ([place]) 12/10/2021   AVM (arteriovenous malformation) of  small bowel, acquired 11/21/2020   Hiatal hernia 04/30/2020   Chronic anticoagulation 04/30/2020   Anemia 05/04/2535   Diastolic dysfunction 64/40/3474   Past Surgical History:  Procedure Laterality Date   ABDOMINAL AORTOGRAM W/LOWER EXTREMITY N/A 01/21/2020   Procedure: ABDOMINAL AORTOGRAM W/LOWER EXTREMITY;  Surgeon: Elam Dutch, MD;  Location: [place] [facility];  Service: Cardiovascular;  Laterality: N/A;   APPENDECTOMY     BIOPSY  02/20/2020   Procedure: BIOPSY;  Surgeon: Rush Landmark Telford Nab., MD;  Location: [place];  Service: Gastroenterology;;   BRONCHIAL BIOPSY  09/04/2021   Procedure: BRONCHIAL BIOPSIES;  Surgeon: Garner Nash, DO;  Location: [facility];  Service: Pulmonary;;   BRONCHIAL BRUSHINGS  09/04/2021   Procedure: BRONCHIAL BRUSHINGS;  Surgeon: Garner Nash, DO;  Location: [place];  Service: Pulmonary;;   BRONCHIAL NEEDLE ASPIRATION BIOPSY  09/04/2021   Procedure: BRONCHIAL NEEDLE ASPIRATION BIOPSIES;  Surgeon: Garner Nash, DO;  Location: [place];  Service: Pulmonary;;   CATARACT EXTRACTION, BILATERAL     ENDARTERECTOMY FEMORAL Right 04/19/2019   Procedure: ENDARTERECTOMY RIGHT COMMON FEMORAL;  Surgeon: Elam Dutch, MD;  Location[facility];  Service: Vascular;  Laterality: Right;   ENDARTERECTOMY FEMORAL Left 01/24/2020   Procedure: LEFT FEMORAL ENDARTERECTOMY WITH DACRON PATCH ANGIOPLASTY;  Surgeon: Elam Dutch, MD;  Location: [facility];  Service: Vascular;  Laterality: Left;   ESOPHAGOGASTRODUODENOSCOPY (EGD) WITH PROPOFOL N/A 02/20/2020   Procedure: ESOPHAGOGASTRODUODENOSCOPY (EGD) WITH PROPOFOL;  Surgeon: Irving Copas., MD;  Location: [place];  Service: Gastroenterology;  Laterality: N/A;   FEMORAL ENDARTERECTOMY Left 01/24/2020   FIDUCIAL MARKER PLACEMENT  09/04/2021   Procedure: FIDUCIAL MARKER PLACEMENT;  Surgeon: Garner Nash, DO;  Location: [place];  Service: Pulmonary;;   HEMOSTASIS CLIP PLACEMENT   02/20/2020   Procedure: HEMOSTASIS CLIP PLACEMENT;  Surgeon: Irving Copas., MD;  Location: [place];  Service: Gastroenterology;;   HOT HEMOSTASIS N/A 02/20/2020   Procedure: HOT HEMOSTASIS (ARGON PLASMA COAGULATION/BICAP);  Surgeon: Irving Copas., MD;  Location: [place];  Service: Gastroenterology;  Laterality: N/A;   INSERTION OF ILIAC STENT Left 01/24/2020   Procedure: INSERTION OF VBX STENT 8X59 AND 8X39 LEFT COMMON ILIAC ARTERY. INSERTION OF INNOVA 7 X 60 INNOVA STENT LEFT EXTERNAL ILIAC ARTERY. ;  Surgeon: Elam Dutch, MD;  Location: [place];  Service: Vascular;  Laterality: Left;   LOWER EXTREMITY ANGIOGRAPHY  12/11/2018   LOWER EXTREMITY ANGIOGRAPHY N/A 12/11/2018   Procedure: LOWER EXTREMITY ANGIOGRAPHY;  Surgeon: Elam Dutch, MD;  Location: [place] [facility];  Service: Cardiovascular;  Laterality: N/A;   PATCH ANGIOPLASTY Right 04/19/2019   Procedure: Patch Angioplasty;  Surgeon: Elam Dutch, MD;  Location: [place];  Service: Vascular;  Laterality: Right;   PERIPHERAL VASCULAR INTERVENTION Right 12/11/2018   Procedure: PERIPHERAL VASCULAR INTERVENTION;  Surgeon:  Elam Dutch, MD;  Location: [place] [facility];  Service: Cardiovascular;  Laterality: Right;  Common Iliac    SKIN CANCER EXCISION     "lips, face, ears, arms" (04/07/2018)   TONSILLECTOMY     ULTRASOUND GUIDANCE FOR VASCULAR ACCESS Right 01/24/2020   Procedure: ULTRASOUND GUIDANCE FOR VASCULAR ACCESS;  Surgeon: Elam Dutch, MD;  Location[facility];  Service: Vascular;  Laterality: Right;   VIDEO BRONCHOSCOPY WITH RADIAL ENDOBRONCHIAL ULTRASOUND  09/04/2021   Procedure: VIDEO BRONCHOSCOPY WITH RADIAL ENDOBRONCHIAL ULTRASOUND;  Surgeon: Garner Nash, DO;  Location: [facility];  Service: Pulmonary;;    Family History  Problem Relation Age of Onset   Heart disease Mother        CABG in her 24s, nonsmoker   Cancer Mother    Stroke Father    Heart disease Father        Died  of MI at age 56, smoker   Hepatitis Sister    Coronary artery disease Other        male 1st degree relative <60   Colon cancer Neg Hx    Pancreatic cancer Neg Hx    Esophageal cancer Neg Hx    Inflammatory bowel disease Neg Hx    Liver disease Neg Hx    Rectal cancer Neg Hx    Stomach cancer Neg Hx     Medications- reviewed and updated Current Outpatient Medications  Medication Sig Dispense Refill   acetaminophen (TYLENOL) 500 MG tablet Take 1,000 mg by mouth every 6 (six) hours as needed for moderate pain.      albuterol (VENTOLIN HFA) 108 (90 Base) MCG/ACT inhaler USE 2 PUFFS EVERY 6 HOURS  AS NEEDED FOR WHEEZING 18 g 3   Calcium Carbonate-Vitamin D (CALCIUM 600+D PO) Take 2 tablets by mouth daily.     ELIQUIS 2.5 MG TABS tablet TAKE 1 TABLET BY MOUTH  TWICE DAILY 180 tablet 3   Ferrous Sulfate (IRON) 325 (65 Fe) MG TABS Take 325 mg by mouth daily.     fluticasone (FLONASE) 50 MCG/ACT nasal spray Place 2 sprays into both nostrils daily. 48 g 3   folic acid (FOLVITE) 1 MG tablet Take 1 tablet (1 mg total) by mouth daily.     furosemide (LASIX) 20 MG tablet TAKE 1 TABLET (20 MG TOTAL) BY MOUTH DAILY AS NEEDED FOR FLUID OR EDEMA. (Patient taking differently: Take 20 mg by mouth daily.) 90 tablet 3   magic mouthwash (nystatin, lidocaine, diphenhydrAMINE, alum & mag hydroxide) suspension Swish and spit 5 mLs 4 (four) times daily. 180 mL 1   magic mouthwash SOLN Take 5 mLs by mouth 3 (three) times daily as needed for mouth pain. 150 mL 0   Multiple Vitamin (MULTIVITAMIN WITH MINERALS) TABS tablet Take 1 tablet by mouth daily.     mupirocin cream (BACTROBAN) 2 % Apply 1 application topically 2 (two) times daily. (Patient taking differently: Apply 1 application. topically daily as needed (Cyst).) 22 g 0   oxyCODONE (OXY IR/ROXICODONE) 5 MG immediate release tablet Take 1 tablet (5 mg total) by mouth every 4 (four) hours as needed for severe pain. 60 tablet 0   OXYGEN Inhale 4-5 L into the  lungs continuous.      pantoprazole (PROTONIX) 20 MG tablet Take 1 tablet (20 mg total) by mouth daily. 90 tablet 1   Polyethyl Glycol-Propyl Glycol (SYSTANE) 0.4-0.3 % SOLN Place 1 drop into both eyes every other day.     predniSONE (DELTASONE)  10 MG tablet Take 4 tablets (40 mg total) by mouth daily with breakfast for 2 days, THEN 2 tablets (20 mg total) daily with breakfast for 2 days, THEN 1 tablet (10 mg total) daily with breakfast for 2 days. 14 tablet 0   Respiratory Therapy Supplies (FLUTTER) DEVI 10 times Twice a day and prn as needed, may increase if feeling worse 1 each 0   roflumilast (DALIRESP) 500 MCG TABS tablet TAKE 1 TABLET BY MOUTH DAILY 90 tablet 3   rosuvastatin (CRESTOR) 10 MG tablet TAKE 1 TABLET BY MOUTH  DAILY 90 tablet 3   SPIRIVA RESPIMAT 2.5 MCG/ACT AERS USE 2 INHALATIONS BY MOUTH  DAILY 12 g 3   SYMBICORT 160-4.5 MCG/ACT inhaler USE 2 INHALATIONS BY MOUTH  TWICE DAILY 30.6 g 3   No current facility-administered medications for this visit.    Allergies-reviewed and updated Allergies  Allergen Reactions   Tape Other (See Comments)    SKIN IS VERY THIN, TEARS SKIN; CAN ONLY USE COBAN WRAPS DUE TO CONDITION OF SKIN!!   Amoxicillin-Pot Clavulanate Other (See Comments)    Thrush/ Sore throat/Voice became hoarse   Ciprofloxacin Nausea Only    Sick on stomach, weak/tired   Levaquin [Levofloxacin] Other (See Comments)    hallucinations   Lipitor [Atorvastatin] Rash    Social History   Social History Narrative   Married (wife Katy Apo patient of Dr. Yong Channel) with children (2-son and daughter). 1 grandson.       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       Hobbies: fishing, projects at home   Objective  Objective:  BP 130/70   Pulse 100   Temp 98.4 F (36.9 C)   Ht 6' (1.829 m)   Wt 174 lb (78.9 kg)   SpO2 95%   BMI 23.60 kg/m  Gen: NAD, resting comfortably HEENT: Mucous membranes are moist. Oropharynx normal Neck: no  thyromegaly CV: RRR no murmurs rubs or gallops (HR high normal) Lungs: diffuse wheeze- otherwise clear Abdomen: soft/nontender/nondistended/normal bowel sounds. No rebound or guarding.  Ext: 1+ edema on right at least, 2+ on left- always swells more on the left Skin: warm, dry, irritated skin tags with excoriation base of left chest- sent in triamcinolone for him Neuro: grossly normal, moves all extremities, PERRLA    Assessment and Plan  76 y.o. male presenting for annual physical.  Health Maintenance counseling: 1. Anticipatory guidance: Patient counseled regarding regular dental exams - q6 months, eye exams -yearly,  avoiding smoking and second hand smoke- quit several years ago , limiting alcohol to 2 beverages per day , no illicit drugs.   2. Risk factor reduction:  Advised patient of need for regular exercise and diet rich and fruits and vegetables to reduce risk of heart attack and stroke.  Exercise- minimal- encouraged consider chair exercises.  Diet/weight management - wife states has never been the biggest eater- monitors portion sizes- dos several times a dayh Wt Readings from Last 3 Encounters:  12/10/21 174 lb (78.9 kg)  12/05/21 174 lb 9.6 oz (79.2 kg)  09/20/21 183 lb (83 kg)  3. Immunizations/screenings/ancillary studies Immunization History  Administered Date(s) Administered   Fluad Quad(high Dose 65+) 03/16/2019, 03/10/2020   Influenza Split 04/24/2012   Influenza Whole 05/08/2009, 04/08/2011   Influenza, High Dose Seasonal PF 05/01/2017, 03/31/2018, 04/17/2021   Influenza,inj,Quad PF,6+ Mos 04/27/2013, 04/15/2014, 04/24/2015   Influenza,inj,quad, With Preservative 05/01/2018   Influenza-Unspecified 05/13/2016   PFIZER Comirnaty(Gray Top)Covid-19 Tri-Sucrose  Vaccine 11/24/2020   PFIZER(Purple Top)SARS-COV-2 Vaccination 07/30/2019, 08/20/2019, 04/07/2020   Pfizer Covid-19 Vaccine Bivalent Booster 41yrs & up 03/16/2021   Pneumococcal Conjugate-13 02/25/2014    Pneumococcal Polysaccharide-23 08/15/2011, 12/19/2016   Pneumococcal-Unspecified 03/08/2017   Td 02/21/2010   Tdap 11/05/2017   Zoster, Live 01/29/2012  4. Prostate cancer screening- passed age based screening recommendations  Lab Results  Component Value Date   PSA 1.02 06/25/2017   PSA 1.17 06/05/2016   PSA 0.71 02/25/2014   5. Colon cancer screening - palliative discussion today- we opted with other health issues and concern for putting him to sleep to defer colonoscopy. Cologuard higher probability of false positive so we opted out of that as well for same reasoning.  6. Skin cancer screening- Dr. Ronnald Ramp [place] derm- plans to schedule visit. Limits sun exposure 7. Smoking associated screening (lung cancer screening, AAA screen 65-75, UA)- former smoker- 2019 quit- lung cancer being monitored 8. STD screening - only active with wife  Status of chronic or acute concerns   #Small cell lung cancer of right lung-9 mm lesion January 2023-treated with radiation therapy April 2023.  Last visit with oncology 09/20/2021 with plan for 47-month follow-up repeat imaging. More winded after radiation.   #COPD-continues close follow-up with pulmonology-recently treated for flare with Omnicef for 5 days as well as prednisone.  Remains on baseline prednisone 10 mg, azithromycin Monday Wednesday Friday and  romflumilast.  He is also on Spiriva, Symbicort, Daliresp.  Still uses albuterol- inhaler lasting 3 months lately- had been more than one a month  -Noted chronic respiratory failure-continue home oxygen 4 L    #Chronic pulmonary embolism-remains on low-dose Eliquis 2.5 mg twice daily-technically more of an A-fib related dose reduction but D-dimer was normal January 2023   #Iron deficiency anemia-update CBC and iron levels today-currently on-iron daily ferrous sulfate 325 mg. Occasional transfusions  #hyperlipidemia #PAD-no claudication but rather sedentary  S: Medication: Off aspirin.  On Eliquis 2.5 mg  twice daily alone.  He is tolerating rosuvastatin 10 mg daily- we need updated labs  Lab Results  Component Value Date   CHOL 140 01/26/2020   HDL 63 01/26/2020   LDLCALC 62 01/26/2020   LDLDIRECT 72.0 12/07/2020   TRIG 76 01/26/2020   CHOLHDL 2.2 01/26/2020   A/P: PAD stable but sedentary. Lipids hopefully at goal- update direct LDl today- has tolerates rosuvastatin better than atorvastatin   #Chronic back pain-is followed with Dr. [name] for this.  Has prescription for oxycodone for his back.  - if need be and still such sparing use id be willing to refill- had #60 since last April and most of this was with radiation.   # Osteoporosis S: Last DEXA: 12/11/2020 actually with significant improvement  Medication (bisphosphonate or prolia): Plan was for Reclast which she received 12/26/2020  Calcium: 1200mg  (through diet ok) recommended  Vitamin D: 1000 units a day recommended- he takes this Last vitamin D Lab Results  Component Value Date   VD25OH 36.11 06/02/2019  A/P: he prefers to hold off on repeat at this time- has issues with bleeding with infusion. We will recheck bone density and if worsens then restart   #hypertension/CHF S: medication: Lasix 20 mg daily as needed- most days - stable swelling. Down 5 lbs from last year Wt Readings from Last 3 Encounters:  12/10/21 174 lb (78.9 kg)  12/05/21 174 lb 9.6 oz (79.2 kg)  09/20/21 183 lb (83 kg)   BP Readings from Last 3 Encounters:  12/10/21  130/70  12/05/21 112/70  11/27/21 118/70  A/P: overall stable BP and does not appear to have worsening fluid overload- continue current meds   Recommended follow up: Return in about 1 year (around 12/11/2022) for physical or sooner if needed.Schedule b4 you leave. Future Appointments  Date Time Provider [place]  12/10/2021  9:00 AM [facility] None  12/13/2021  4:00 PM [facility]-CT 2 [facility]-CT [place]  12/20/2021 10:00 AM [facility]MED-ONC LAB [facility] None  12/24/2021   9:30 AM Curt Bears, MD [facility] None  01/21/2022  2:00 PM Hayden Pedro, PA-C [facility] None  03/07/2022 11:15 AM Brand Males, MD [facility]ULCARE None   Lab/Order associations: NOT fasting   ICD-10-CM   1. Preventative health care  Z00.00     2. Essential hypertension  I10 CBC with Differential/Platelet    Comprehensive metabolic panel    Lipid panel    3. Hyperlipidemia, unspecified hyperlipidemia type  E78.5 CBC with Differential/Platelet    Comprehensive metabolic panel    Lipid panel    4. Hyperglycemia  R73.9 Hemoglobin A1c    5. Chronic systolic congestive heart failure (HCC)  I50.22     6. Other chronic pulmonary embolism, unspecified whether acute cor pulmonale present (HCC) Chronic I27.82     7. Stage 4 very severe COPD by GOLD classification ([place])  J44.9 IgE    8. Iron deficiency anemia due to chronic blood loss  D50.0 IBC + Ferritin    9. Vitamin D deficiency  E55.9 VITAMIN D 25 Hydroxy (Vit-D Deficiency, Fractures)      No orders of the defined types were placed in this encounter.   Return precautions advised.  Garret Reddish, MD

## 2021-12-11 ENCOUNTER — Other Ambulatory Visit: Payer: Self-pay

## 2021-12-11 DIAGNOSIS — E559 Vitamin D deficiency, unspecified: Secondary | ICD-10-CM

## 2021-12-11 LAB — IGE: IgE (Immunoglobulin E), Serum: 3 kU/L (ref <=114)

## 2021-12-11 NOTE — Progress Notes (Signed)
Patient will call back

## 2021-12-11 NOTE — Telephone Encounter (Signed)
Called and spoke with pt letting him know the results of bloodwork and he verbalized understanding. Nothing further needed. 

## 2021-12-12 ENCOUNTER — Telehealth: Payer: Self-pay | Admitting: Family Medicine

## 2021-12-12 ENCOUNTER — Other Ambulatory Visit: Payer: Self-pay | Admitting: Family Medicine

## 2021-12-12 ENCOUNTER — Telehealth: Payer: Self-pay

## 2021-12-12 DIAGNOSIS — I10 Essential (primary) hypertension: Secondary | ICD-10-CM

## 2021-12-12 NOTE — Telephone Encounter (Signed)
PT called to advise that he had PCP apt on 12/10/21 and his PCP drew labs. Patient requested that lab apt on 12/20/21 be cancelled. Reviewed future appointment date and time for next apt with Dr. Julien Nordmann.

## 2021-12-12 NOTE — Telephone Encounter (Signed)
Thank you. Pt has been scheduled for 03/14/22 at 10 am for lab only.

## 2021-12-12 NOTE — Telephone Encounter (Signed)
Pt called to schedule labs for 3 month vitamin D check. I wanted to make verify if this was intended as a lab only visit or regular OV. Please Advise.

## 2021-12-12 NOTE — Telephone Encounter (Signed)
Lab only 

## 2021-12-13 ENCOUNTER — Ambulatory Visit (HOSPITAL_COMMUNITY)
Admission: RE | Admit: 2021-12-13 | Discharge: 2021-12-13 | Disposition: A | Discharge status: Home / Self Care | Payer: Medicare Other | Source: Ambulatory Visit | Attending: Internal Medicine | Admitting: Internal Medicine

## 2021-12-13 ENCOUNTER — Other Ambulatory Visit (HOSPITAL_COMMUNITY): Payer: Medicare Other

## 2021-12-13 DIAGNOSIS — C3490 Malignant neoplasm of unspecified part of unspecified bronchus or lung: Secondary | ICD-10-CM | POA: Diagnosis not present

## 2021-12-15 ENCOUNTER — Other Ambulatory Visit: Payer: Self-pay | Admitting: Family Medicine

## 2021-12-17 NOTE — Progress Notes (Signed)
  Radiation Oncology         [phone] ________________________________  Name: Francisco [name] MRN: [id number]  Date of Service: 12/10/2021  DOB: 1946/05/16  Post Treatment Telephone Note  Diagnosis:   Limited Stage , cT1bN0M0, Small cell carcinoma of the RLL   Indication for treatment:  Curative        Radiation treatment dates:   10/16/21-10/23/21   Site/dose:   The tumor in the RLL was treated with a course of stereotactic body radiation treatment. The patient received 54 Gy In 3 fractions at 18 Gy per fraction.  Narrative: The patient tolerated radiation treatment relatively well. He had a post treatment CT on 12/13/21 that showed stable post treatment changes. His wife answered and stated he was doing well. No new symptoms or concerns were noted.  Impression/Plan: 1. Limited Stage , cT1bN0M0, Small cell carcinoma of the RLL . The patient has been doing well since completion of radiotherapy.  We did discuss the rationale for prophylactic cranial irradiation as well given his histology. He is not interested in proceeding, but open to repeat MRI in 3 months. He will continue follow up with Dr. Julien Nordmann for surveillance.      Carola Rhine, PAC

## 2021-12-20 ENCOUNTER — Inpatient Hospital Stay: Payer: Medicare Other

## 2021-12-24 ENCOUNTER — Other Ambulatory Visit: Payer: Self-pay

## 2021-12-24 ENCOUNTER — Inpatient Hospital Stay: Payer: Medicare Other | Attending: Internal Medicine | Admitting: Internal Medicine

## 2021-12-24 VITALS — BP 139/78 | HR 102 | Temp 97.8°F | Resp 16 | Ht 72.0 in

## 2021-12-24 DIAGNOSIS — Z9981 Dependence on supplemental oxygen: Secondary | ICD-10-CM | POA: Diagnosis not present

## 2021-12-24 DIAGNOSIS — F1010 Alcohol abuse, uncomplicated: Secondary | ICD-10-CM | POA: Insufficient documentation

## 2021-12-24 DIAGNOSIS — C3490 Malignant neoplasm of unspecified part of unspecified bronchus or lung: Secondary | ICD-10-CM | POA: Diagnosis not present

## 2021-12-24 DIAGNOSIS — Z7901 Long term (current) use of anticoagulants: Secondary | ICD-10-CM | POA: Diagnosis not present

## 2021-12-24 DIAGNOSIS — M8580 Other specified disorders of bone density and structure, unspecified site: Secondary | ICD-10-CM | POA: Insufficient documentation

## 2021-12-24 DIAGNOSIS — Z79899 Other long term (current) drug therapy: Secondary | ICD-10-CM | POA: Diagnosis not present

## 2021-12-24 DIAGNOSIS — K7460 Unspecified cirrhosis of liver: Secondary | ICD-10-CM | POA: Insufficient documentation

## 2021-12-24 DIAGNOSIS — C3431 Malignant neoplasm of lower lobe, right bronchus or lung: Secondary | ICD-10-CM | POA: Diagnosis present

## 2021-12-24 NOTE — Progress Notes (Signed)
[place] Telephone:([phone]   Fax:([phone]  OFFICE PROGRESS NOTE  [name], MD [place] [address]  DIAGNOSIS:  limited stage (T1a, N0, M0) small cell lung cancer presented with 0.9 cm superior segment right lower lobe pulmonary nodule diagnosed in February 2023.  PRIOR THERAPY: SBRT to the right lower lobe lung nodule under the care of Dr. Lisbeth Renshaw  CURRENT THERAPY: Observation  INTERVAL HISTORY: Francisco [name] 76 y.o. male returns to the clinic today for follow-up visit accompanied by his wife.  The patient is feeling fine today with no concerning complaints except for the baseline shortness of breath and he is currently on home oxygen.  He denied having any current chest pain, cough or hemoptysis.  He denied having any fever or chills.  He has no nausea, vomiting, diarrhea or constipation.  He denied having any headache or visual changes.  He has no recent weight loss or night sweats.  He tolerated his previous treatment with SBRT to the right lower lobe lung nodule fairly well under the care of Dr. Lisbeth Renshaw.  He is here today for evaluation with repeat CT scan of the chest for restaging of his disease.  MEDICAL HISTORY: Past Medical History:  Diagnosis Date   Chronic back pain    "mid and lower" (04/07/2018)   Chronic rhinitis    -Sinus Ct 08/01/2009 >> Bilateral maxillary sinusitis with some mucosal thickeningin the sphenoid and frontal sinuses as well with air fluid levels present -chronic rhinitis flyer Aug 04, 2009   Compressed spine fracture [facility])    COPD (chronic obstructive pulmonary disease) (HCC)    PFT's rec Jul 17, 2009   Dyspnea    Emphysema lung ([place])    On home oxygen therapy    "3L; 24/7" (04/07/2018)   Onychomycosis    Dr. Judi Cong   Orthostatic hypotension    "since 10/2017" (04/07/2018)   PAD (peripheral artery disease) ([place])    Pneumonia    "twice in 1 year" (04/07/2018)   Pulmonary embolism ([place])  04/07/2018   Skin cancer    "lips, face, ears, arms" (04/07/2018)   Small cell lung cancer, right ([place]) 2023   Dr. Lisbeth Renshaw.  rad   Vertigo    "since ~ 02/2018" (04/07/2018)    ALLERGIES:  is allergic to tape, amoxicillin-pot clavulanate, ciprofloxacin, levaquin [levofloxacin], and lipitor [atorvastatin].  MEDICATIONS:  Current Outpatient Medications  Medication Sig Dispense Refill   acetaminophen (TYLENOL) 500 MG tablet Take 1,000 mg by mouth every 6 (six) hours as needed for moderate pain.      albuterol (VENTOLIN HFA) 108 (90 Base) MCG/ACT inhaler USE 2 PUFFS EVERY 6 HOURS  AS NEEDED FOR WHEEZING 18 g 3   Calcium Carbonate-Vitamin D (CALCIUM 600+D PO) Take 2 tablets by mouth daily.     ELIQUIS 2.5 MG TABS tablet TAKE 1 TABLET BY MOUTH  TWICE DAILY 180 tablet 3   Ferrous Sulfate (IRON) 325 (65 Fe) MG TABS Take 325 mg by mouth daily.     fluticasone (FLONASE) 50 MCG/ACT nasal spray Place 2 sprays into both nostrils daily. 48 g 3   folic acid (FOLVITE) 1 MG tablet Take 1 tablet (1 mg total) by mouth daily.     furosemide (LASIX) 20 MG tablet TAKE 1 TABLET BY MOUTH  DAILY AS NEEDED FOR FLUID  OR EDEMA 90 tablet 3   magic mouthwash (nystatin, lidocaine, diphenhydrAMINE, alum & mag hydroxide) suspension Swish and  spit 5 mLs 4 (four) times daily. 180 mL 1   magic mouthwash SOLN Take 5 mLs by mouth 3 (three) times daily as needed for mouth pain. 150 mL 0   Multiple Vitamin (MULTIVITAMIN WITH MINERALS) TABS tablet Take 1 tablet by mouth daily.     mupirocin cream (BACTROBAN) 2 % Apply 1 application topically 2 (two) times daily. (Patient taking differently: Apply 1 application. topically daily as needed (Cyst).) 22 g 0   oxyCODONE (OXY IR/ROXICODONE) 5 MG immediate release tablet Take 1 tablet (5 mg total) by mouth every 4 (four) hours as needed for severe pain. 60 tablet 0   OXYGEN Inhale 4-5 L into the lungs continuous.      pantoprazole (PROTONIX) 20 MG tablet Take 1 tablet (20 mg total) by mouth  daily. 90 tablet 1   Polyethyl Glycol-Propyl Glycol (SYSTANE) 0.4-0.3 % SOLN Place 1 drop into both eyes every other day.     Respiratory Therapy Supplies (FLUTTER) DEVI 10 times Twice a day and prn as needed, may increase if feeling worse 1 each 0   roflumilast (DALIRESP) 500 MCG TABS tablet TAKE 1 TABLET BY MOUTH DAILY 90 tablet 3   rosuvastatin (CRESTOR) 10 MG tablet TAKE 1 TABLET BY MOUTH  DAILY 90 tablet 3   SPIRIVA RESPIMAT 2.5 MCG/ACT AERS USE 2 INHALATIONS BY MOUTH  DAILY 12 g 3   SYMBICORT 160-4.5 MCG/ACT inhaler USE 2 INHALATIONS BY MOUTH  TWICE DAILY 30.6 g 3   triamcinolone cream (KENALOG) 0.1 % Apply 1 application. topically 2 (two) times daily. For 7-10 days maximum 80 g 0   No current facility-administered medications for this visit.    SURGICAL HISTORY:  Past Surgical History:  Procedure Laterality Date   ABDOMINAL AORTOGRAM W/LOWER EXTREMITY N/A 01/21/2020   Procedure: ABDOMINAL AORTOGRAM W/LOWER EXTREMITY;  Surgeon: Elam Dutch, MD;  Location: [place] [facility];  Service: Cardiovascular;  Laterality: N/A;   APPENDECTOMY     BIOPSY  02/20/2020   Procedure: BIOPSY;  Surgeon: Rush Landmark Telford Nab., MD;  Location: [place];  Service: Gastroenterology;;   BRONCHIAL BIOPSY  09/04/2021   Procedure: BRONCHIAL BIOPSIES;  Surgeon: Garner Nash, DO;  Location: [place] [facility];  Service: Pulmonary;;   BRONCHIAL BRUSHINGS  09/04/2021   Procedure: BRONCHIAL BRUSHINGS;  Surgeon: Garner Nash, DO;  Location: [place];  Service: Pulmonary;;   BRONCHIAL NEEDLE ASPIRATION BIOPSY  09/04/2021   Procedure: BRONCHIAL NEEDLE ASPIRATION BIOPSIES;  Surgeon: Garner Nash, DO;  Location: [place];  Service: Pulmonary;;   CATARACT EXTRACTION, BILATERAL     ENDARTERECTOMY FEMORAL Right 04/19/2019   Procedure: ENDARTERECTOMY RIGHT COMMON FEMORAL;  Surgeon: Elam Dutch, MD;  Location: [facility] OR;  Service: Vascular;  Laterality: Right;   ENDARTERECTOMY FEMORAL Left 01/24/2020    Procedure: LEFT FEMORAL ENDARTERECTOMY WITH DACRON PATCH ANGIOPLASTY;  Surgeon: Elam Dutch, MD;  Location: [facility];  Service: Vascular;  Laterality: Left;   ESOPHAGOGASTRODUODENOSCOPY (EGD) WITH PROPOFOL N/A 02/20/2020   Procedure: ESOPHAGOGASTRODUODENOSCOPY (EGD) WITH PROPOFOL;  Surgeon: Irving Copas., MD;  Location: [place];  Service: Gastroenterology;  Laterality: N/A;   FEMORAL ENDARTERECTOMY Left 01/24/2020   FIDUCIAL MARKER PLACEMENT  09/04/2021   Procedure: FIDUCIAL MARKER PLACEMENT;  Surgeon: Garner Nash, DO;  Location: [place];  Service: Pulmonary;;   HEMOSTASIS CLIP PLACEMENT  02/20/2020   Procedure: HEMOSTASIS CLIP PLACEMENT;  Surgeon: Irving Copas., MD;  Location: [place];  Service: Gastroenterology;;   HOT HEMOSTASIS N/A 02/20/2020   Procedure:  HOT HEMOSTASIS (ARGON PLASMA COAGULATION/BICAP);  Surgeon: Irving Copas., MD;  Location: [place];  Service: Gastroenterology;  Laterality: N/A;   INSERTION OF ILIAC STENT Left 01/24/2020   Procedure: INSERTION OF VBX STENT 8X59 AND 8X39 LEFT COMMON ILIAC ARTERY. INSERTION OF INNOVA 7 X 60 INNOVA STENT LEFT EXTERNAL ILIAC ARTERY. ;  Surgeon: Elam Dutch, MD;  Location: [place];  Service: Vascular;  Laterality: Left;   LOWER EXTREMITY ANGIOGRAPHY  12/11/2018   LOWER EXTREMITY ANGIOGRAPHY N/A 12/11/2018   Procedure: LOWER EXTREMITY ANGIOGRAPHY;  Surgeon: Elam Dutch, MD;  Location: [place] [facility];  Service: Cardiovascular;  Laterality: N/A;   PATCH ANGIOPLASTY Right 04/19/2019   Procedure: Patch Angioplasty;  Surgeon: Elam Dutch, MD;  Location: [facility];  Service: Vascular;  Laterality: Right;   PERIPHERAL VASCULAR INTERVENTION Right 12/11/2018   Procedure: PERIPHERAL VASCULAR INTERVENTION;  Surgeon: Elam Dutch, MD;  Location: [place] [facility];  Service: Cardiovascular;  Laterality: Right;  Common Iliac    SKIN CANCER EXCISION     "lips, face, ears, arms" (04/07/2018)    TONSILLECTOMY     ULTRASOUND GUIDANCE FOR VASCULAR ACCESS Right 01/24/2020   Procedure: ULTRASOUND GUIDANCE FOR VASCULAR ACCESS;  Surgeon: Elam Dutch, MD;  Location: [place];  Service: Vascular;  Laterality: Right;   VIDEO BRONCHOSCOPY WITH RADIAL ENDOBRONCHIAL ULTRASOUND  09/04/2021   Procedure: VIDEO BRONCHOSCOPY WITH RADIAL ENDOBRONCHIAL ULTRASOUND;  Surgeon: Garner Nash, DO;  Location: [facility];  Service: Pulmonary;;    REVIEW OF SYSTEMS:  A comprehensive review of systems was negative except for: Constitutional: positive for fatigue Respiratory: positive for dyspnea on exertion Musculoskeletal: positive for arthralgias and muscle weakness   PHYSICAL EXAMINATION: General appearance: alert, cooperative, fatigued, and no distress Head: Normocephalic, without obvious abnormality, atraumatic Neck: no adenopathy, no JVD, supple, symmetrical, trachea midline, and thyroid not enlarged, symmetric, no tenderness/mass/nodules Lymph nodes: Cervical, supraclavicular, and axillary nodes normal. Resp: clear to auscultation bilaterally Back: symmetric, no curvature. ROM normal. No CVA tenderness. Cardio: regular rate and rhythm, S1, S2 normal, no murmur, click, rub or gallop GI: soft, non-tender; bowel sounds normal; no masses,  no organomegaly Extremities: extremities normal, atraumatic, no cyanosis or edema  ECOG PERFORMANCE STATUS: 1 - Symptomatic but completely ambulatory  Blood pressure 139/78, pulse (!) 102, temperature 97.8 F (36.6 C), temperature source Oral, resp. rate 16, height 6' (1.829 m), SpO2 90 %.  LABORATORY DATA: Lab Results  Component Value Date   WBC 10.3 12/10/2021   HGB 12.6 (L) 12/10/2021   HCT 36.9 (L) 12/10/2021   MCV 96.7 12/10/2021   PLT 236.0 12/10/2021      Chemistry      Component Value Date/Time   NA 141 12/10/2021 0849   NA 140 03/02/2020 0000   K 4.6 12/10/2021 0849   CL 98 12/10/2021 0849   CO2 32 12/10/2021 0849   BUN 15 12/10/2021  0849   BUN 14 03/02/2020 0000   CREATININE 1.17 12/10/2021 0849   CREATININE 1.37 (H) 10/04/2020 1453   CREATININE 1.14 07/06/2020 0941   GLU 90 03/02/2020 0000      Component Value Date/Time   CALCIUM 10.0 12/10/2021 0849   ALKPHOS 60 12/10/2021 0849   AST 21 12/10/2021 0849   AST 27 10/04/2020 1453   ALT 16 12/10/2021 0849   ALT 26 10/04/2020 1453   BILITOT 0.7 12/10/2021 0849   BILITOT 0.4 10/04/2020 1453       RADIOGRAPHIC STUDIES: CT Chest Wo Contrast  Result Date: 12/14/2021 CLINICAL DATA:  Primary Cancer Type: Lung Imaging Indication: Assess response to therapy Interval therapy since last imaging? Yes Initial Cancer Diagnosis Date: 09/04/2021; Established by: Biopsy-proven Detailed Pathology: Limited stage small cell lung cancer. Primary Tumor location: Right lower lobe. Surgeries: No thoracic. Appendectomy. Chemotherapy: No Immunotherapy? No Radiation therapy? Yes; Date Range: 10/16/2021 - 10/23/2021; Target: Right lower lung * Tracking Code: BO * EXAM: CT CHEST WITHOUT CONTRAST TECHNIQUE: Multidetector CT imaging of the chest was performed following the standard protocol without IV contrast. RADIATION DOSE REDUCTION: This exam was performed according to the departmental dose-optimization program which includes automated exposure control, adjustment of the mA and/or kV according to patient size and/or use of iterative reconstruction technique. COMPARISON:  08/08/2021 PET-CT.  Most recent CT chest 07/10/2021. FINDINGS: Cardiovascular: Aortic atherosclerosis. Tortuous thoracic aorta. Upper normal ascending aortic caliber, 3.9 cm. Similar. Normal heart size, without pericardial effusion. Left main and 3 vessel coronary artery calcification. Aortic valve calcification. Mediastinum/Nodes: No supraclavicular adenopathy. No mediastinal or definite hilar adenopathy, given limitations of unenhanced CT. Lungs/Pleura: No pleural fluid. Advanced bullous emphysema. Right greater than left apical  pleuroparenchymal scarring. The lateral right lower lobe pulmonary nodule is decreased. Example 8 x 8 mm on 62/5 versus 11 x 7 mm on the prior exam. Fiducial within the subtending bronchus on 68/5. Posterior left upper lobe volume loss and mild scarring. Upper Abdomen: Irregular hepatic capsule. Normal imaged portions of the spleen, stomach, gallbladder. Mild renal cortical thinning bilaterally. Incompletely imaged lower pole right renal 1.9 cm fluid density lesion is likely a cyst. Bilateral adrenal thickening and mild nodularity are similar. Pancreatic tail 1.8 cm cystic lesion on 138/2 is similar to on the prior exam. Not readily apparent on the remote of 07/17/2017. Musculoskeletal: Presumed sebaceous cyst about the high left posterior chest wall. Osteopenia. Remote left clavicular fracture. T6 through T8 moderate to severe compression deformities are unchanged. New T9 moderate to severe compression deformity. IMPRESSION: 1. Response to therapy of right lower lobe pulmonary nodule. No new or progressive disease. 2. Aortic atherosclerosis (ICD10-I70.0), coronary artery atherosclerosis and emphysema (ICD10-J43.9). 3. Irregular hepatic capsule, most consistent with cirrhosis. EMR describes 35 standard drinks of alcohol per week. 4. Low-density lesion within the pancreatic tail is similar to on the prior exam but new compared to 2019. 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 Recommend attention on follow-up. 5. Aortic valvular calcifications. Consider echocardiography to evaluate for valvular dysfunction. 6. Osteopenia with new T9 compression deformity. T6 through T8 compression deformities are unchanged. Electronically Signed   By: Abigail Miyamoto M.D.   On: 12/14/2021 11:10    ASSESSMENT AND PLAN: This is a very pleasant 76 years old white male diagnosed with limited stage (T1 a, N0, M0) small cell lung cancer presented with 0.9 cm superior segment right lower lobe pulmonary nodule diagnosed in February 2023  status post SBRT to the right lower lobe lung nodule and the patient is currently on observation.  He was not a great candidate for systemic chemotherapy and his currently doing well. He had repeat CT scan of the chest performed recently.  I personally and independently reviewed the scans and discussed the results with the patient and his wife. His scan showed response to the treatment of the right lower lobe pulmonary nodule and no new or progressive disease.  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 I discussed the scan results with the patient and his wife and recommended for him to continue on  observation with repeat CT scan of the chest in 6 months. For the alcohol abuse, I strongly encouraged the patient to cut on his alcohol drinking because of the progressive cirrhosis. He was advised to call immediately if he has any other concerning symptoms in the interval. The patient voices understanding of current disease status and treatment options and is in agreement with the current care plan.  All questions were answered. The patient knows to call the clinic with any problems, questions or concerns. We can certainly see the patient much sooner if necessary.  The total time spent in the appointment was 20 minutes.  Disclaimer: This note was dictated with voice recognition software. Similar sounding words can inadvertently be transcribed and may not be corrected upon review.

## 2021-12-30 ENCOUNTER — Other Ambulatory Visit: Payer: Self-pay | Admitting: Internal Medicine

## 2021-12-31 ENCOUNTER — Encounter: Payer: Self-pay | Admitting: Internal Medicine

## 2021-12-31 ENCOUNTER — Telehealth: Payer: Self-pay | Admitting: *Deleted

## 2021-12-31 MED ORDER — ALBUTEROL SULFATE HFA 108 (90 BASE) MCG/ACT IN AERS: INHALATION_SPRAY | RESPIRATORY_TRACT | 3 refills | Status: DC

## 2022-01-01 ENCOUNTER — Telehealth: Payer: Self-pay | Admitting: *Deleted

## 2022-01-14 ENCOUNTER — Ambulatory Visit (HOSPITAL_COMMUNITY): Payer: Medicare Other

## 2022-01-19 ENCOUNTER — Ambulatory Visit (HOSPITAL_COMMUNITY)
Admission: RE | Admit: 2022-01-19 | Discharge: 2022-01-19 | Disposition: A | Discharge status: Home / Self Care | Payer: Medicare Other | Source: Ambulatory Visit | Attending: Radiation Oncology | Admitting: Radiation Oncology

## 2022-01-19 DIAGNOSIS — C3490 Malignant neoplasm of unspecified part of unspecified bronchus or lung: Secondary | ICD-10-CM | POA: Diagnosis present

## 2022-01-19 MED ORDER — GADOBUTROL 1 MMOL/ML IV SOLN
7.5000 mL | Freq: Once | INTRAVENOUS | Status: AC | PRN
  Administered 2022-01-19: 7.5 mL via INTRAVENOUS

## 2022-01-21 ENCOUNTER — Ambulatory Visit
Admission: RE | Admit: 2022-01-21 | Discharge: 2022-01-21 | Disposition: A | Discharge status: Home / Self Care | Payer: Medicare Other | Source: Ambulatory Visit | Attending: Radiation Oncology | Admitting: Radiation Oncology

## 2022-01-21 ENCOUNTER — Encounter: Payer: Self-pay | Admitting: Radiation Oncology

## 2022-01-21 DIAGNOSIS — C3431 Malignant neoplasm of lower lobe, right bronchus or lung: Secondary | ICD-10-CM

## 2022-01-21 NOTE — Progress Notes (Signed)
Radiation Oncology         [phone] ________________________________  Outpatient Follow Up- Conducted via telephone at patient request.  I spoke with the patient to conduct this consult visit via telephone. The patient was notified in advance and was offered an in person or telemedicine meeting to allow for face to face communication but instead preferred to proceed with a telephone visit.    Name: Francisco [name]        MRN: [id number]  Date of Service: 01/21/2022 DOB: Apr 18, 1946  WJ:[id number], [name], MD  Garner Nash, DO     REFERRING PHYSICIAN: Garner Nash, DO   DIAGNOSIS: The encounter diagnosis was Malignant neoplasm of lower lobe of right lung ([place]).   HISTORY OF PRESENT ILLNESS: Francisco [name] is a 76 y.o. 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  The patient has been followed for several years due to COPD and shortness of breath.  In January 2023 a new pulmonary nodule in the superior segment of the right lower lobe measuring 9 mm was noted.  Stable scarring and masslike architectural distortion within the right lung apex and diffuse bronchial wall thickening consistent with changes also of emphysema, stable thoracic compression fractures and stigmata of aortic atherosclerosis were noted.  On 08/08/2021 he underwent PET imaging that showed a solitary hypermetabolic pulmonary nodule in the right lower lobe with intense hypermetabolism with an SUV up to 7.7, the lesion measured 13 mm.  No evidence of metastatic disease was identified.  He underwent bronchoscopy on 09/04/2021 and cytology showed small cell carcinoma. He received stereotactic body radiotherapy (SBRT) to the RLL and is followed in surveillance with Dr. Julien Nordmann. He declined prophylactic cranial irradiation, but desired close surveillance with MRI imaging.   Restaging CT CAP on 12/13/21 showed response to therapy in the RLL with no new or progressive disease. He has atherosclerotic  changes, cirrhotic changes, and a lesion in the pancreas that has been stable, and compression changes in the thoracic spine were noted. He has been followed by Dr. [name] for this. He had an MRI of the brain on 01/19/22 that showed no evidence of intracranial abnormality or metastatic disease. He had small bilateral mastoid effusions, and a right maxillary sinus retention cyst.  He is contacted by phone to review this.     PREVIOUS RADIATION THERAPY: Yes   10/16/21-10/23/21 SBRT Treatment The tumor in the RLL was treated with a course of stereotactic body radiation treatment. The patient received 54 Gy In 3 fractions at 18 Gy per fraction.   PAST MEDICAL HISTORY:  Past Medical History:  Diagnosis Date   Chronic back pain    "mid and lower" (04/07/2018)   Chronic rhinitis    -Sinus Ct 08/01/2009 >> Bilateral maxillary sinusitis with some mucosal thickeningin the sphenoid and frontal sinuses as well with air fluid levels present -chronic rhinitis flyer Aug 04, 2009   Compressed spine fracture [facility])    COPD (chronic obstructive pulmonary disease) (HCC)    PFT's rec Jul 17, 2009   Dyspnea    Emphysema lung ([place])    On home oxygen therapy    "3L; 24/7" (04/07/2018)   Onychomycosis    Dr. Judi Cong   Orthostatic hypotension    "since 10/2017" (04/07/2018)   PAD (peripheral artery disease) ([place])    Pneumonia    "twice in 1 year" (04/07/2018)   Pulmonary embolism ([place]) 04/07/2018   Skin cancer    "  lips, face, ears, arms" (04/07/2018)   Small cell lung cancer, right ([place]) 2023   Dr. Lisbeth Renshaw.  rad   Vertigo    "since ~ 02/2018" (04/07/2018)       PAST SURGICAL HISTORY: Past Surgical History:  Procedure Laterality Date   ABDOMINAL AORTOGRAM W/LOWER EXTREMITY N/A 01/21/2020   Procedure: ABDOMINAL AORTOGRAM W/LOWER EXTREMITY;  Surgeon: Elam Dutch, MD;  Location: [place] [facility];  Service: Cardiovascular;  Laterality: N/A;   APPENDECTOMY     BIOPSY  02/20/2020   Procedure: BIOPSY;  Surgeon:  Rush Landmark Telford Nab., MD;  Location: [place];  Service: Gastroenterology;;   BRONCHIAL BIOPSY  09/04/2021   Procedure: BRONCHIAL BIOPSIES;  Surgeon: Garner Nash, DO;  Location: [place] [facility];  Service: Pulmonary;;   BRONCHIAL BRUSHINGS  09/04/2021   Procedure: BRONCHIAL BRUSHINGS;  Surgeon: Garner Nash, DO;  Location: [place];  Service: Pulmonary;;   BRONCHIAL NEEDLE ASPIRATION BIOPSY  09/04/2021   Procedure: BRONCHIAL NEEDLE ASPIRATION BIOPSIES;  Surgeon: Garner Nash, DO;  Location: [place];  Service: Pulmonary;;   CATARACT EXTRACTION, BILATERAL     ENDARTERECTOMY FEMORAL Right 04/19/2019   Procedure: ENDARTERECTOMY RIGHT COMMON FEMORAL;  Surgeon: Elam Dutch, MD;  Location: [facility];  Service: Vascular;  Laterality: Right;   ENDARTERECTOMY FEMORAL Left 01/24/2020   Procedure: LEFT FEMORAL ENDARTERECTOMY WITH DACRON PATCH ANGIOPLASTY;  Surgeon: Elam Dutch, MD;  [facility];  Service: Vascular;  Laterality: Left;   ESOPHAGOGASTRODUODENOSCOPY (EGD) WITH PROPOFOL N/A 02/20/2020   Procedure: ESOPHAGOGASTRODUODENOSCOPY (EGD) WITH PROPOFOL;  Surgeon: Irving Copas., MD;  Location: [place];  Service: Gastroenterology;  Laterality: N/A;   FEMORAL ENDARTERECTOMY Left 01/24/2020   FIDUCIAL MARKER PLACEMENT  09/04/2021   Procedure: FIDUCIAL MARKER PLACEMENT;  Surgeon: Garner Nash, DO;  Location: [place];  Service: Pulmonary;;   HEMOSTASIS CLIP PLACEMENT  02/20/2020   Procedure: HEMOSTASIS CLIP PLACEMENT;  Surgeon: Irving Copas., MD;  Location: [place];  Service: Gastroenterology;;   HOT HEMOSTASIS N/A 02/20/2020   Procedure: HOT HEMOSTASIS (ARGON PLASMA COAGULATION/BICAP);  Surgeon: Irving Copas., MD;  Location: [place];  Service: Gastroenterology;  Laterality: N/A;   INSERTION OF ILIAC STENT Left 01/24/2020   Procedure: INSERTION OF VBX STENT 8X59 AND 8X39 LEFT COMMON ILIAC ARTERY. INSERTION OF INNOVA 7 X 60  INNOVA STENT LEFT EXTERNAL ILIAC ARTERY. ;  Surgeon: Elam Dutch, MD;  Location: [place];  Service: Vascular;  Laterality: Left;   LOWER EXTREMITY ANGIOGRAPHY  12/11/2018   LOWER EXTREMITY ANGIOGRAPHY N/A 12/11/2018   Procedure: LOWER EXTREMITY ANGIOGRAPHY;  Surgeon: Elam Dutch, MD;  Location: [place] [facility];  Service: Cardiovascular;  Laterality: N/A;   PATCH ANGIOPLASTY Right 04/19/2019   Procedure: Patch Angioplasty;  Surgeon: Elam Dutch, MD;  Location: [facility];  Service: Vascular;  Laterality: Right;   PERIPHERAL VASCULAR INTERVENTION Right 12/11/2018   Procedure: PERIPHERAL VASCULAR INTERVENTION;  Surgeon: Elam Dutch, MD;  Location: [place] [facility];  Service: Cardiovascular;  Laterality: Right;  Common Iliac    SKIN CANCER EXCISION     "lips, face, ears, arms" (04/07/2018)   TONSILLECTOMY     ULTRASOUND GUIDANCE FOR VASCULAR ACCESS Right 01/24/2020   Procedure: ULTRASOUND GUIDANCE FOR VASCULAR ACCESS;  Surgeon: Elam Dutch, MD;  Location: [place];  Service: Vascular;  Laterality: Right;   VIDEO BRONCHOSCOPY WITH RADIAL ENDOBRONCHIAL ULTRASOUND  09/04/2021   Procedure: VIDEO BRONCHOSCOPY WITH RADIAL ENDOBRONCHIAL ULTRASOUND;  Surgeon: Garner Nash, DO;  Location: [facility];  Service: Pulmonary;;     FAMILY HISTORY:  Family History  Problem Relation Age of Onset   Heart disease Mother        CABG in her 53s, nonsmoker   Cancer Mother    Stroke Father    Heart disease Father        Died of MI at age 24, smoker   Hepatitis Sister    Coronary artery disease Other        male 1st degree relative <60   Colon cancer Neg Hx    Pancreatic cancer Neg Hx    Esophageal cancer Neg Hx    Inflammatory bowel disease Neg Hx    Liver disease Neg Hx    Rectal cancer Neg Hx    Stomach cancer Neg Hx      SOCIAL HISTORY:  reports that he quit smoking about 4 years ago. His smoking use included pipe and cigarettes. He started smoking about 56 years ago. He has  a 104.00 pack-year smoking history. He has never used smokeless tobacco. He reports current alcohol use of about 35.0 standard drinks of alcohol per week. He reports that he does not use drugs. The patient is married and lives in [place].    ALLERGIES: Tape, Amoxicillin-pot clavulanate, Ciprofloxacin, Levaquin [levofloxacin], and Lipitor [atorvastatin]   MEDICATIONS:  Current Outpatient Medications  Medication Sig Dispense Refill   acetaminophen (TYLENOL) 500 MG tablet Take 1,000 mg by mouth every 6 (six) hours as needed for moderate pain.      albuterol (VENTOLIN HFA) 108 (90 Base) MCG/ACT inhaler USE 2 INHALATIONS BY MOUTH  EVERY 6 HOURS AS NEEDED FOR WHEEZING 72 g 3   Calcium Carbonate-Vitamin D (CALCIUM 600+D PO) Take 2 tablets by mouth daily.     ELIQUIS 2.5 MG TABS tablet TAKE 1 TABLET BY MOUTH  TWICE DAILY 180 tablet 3   Ferrous Sulfate (IRON) 325 (65 Fe) MG TABS Take 325 mg by mouth daily.     fluticasone (FLONASE) 50 MCG/ACT nasal spray Place 2 sprays into both nostrils daily. 48 g 3   folic acid (FOLVITE) 1 MG tablet Take 1 tablet (1 mg total) by mouth daily.     furosemide (LASIX) 20 MG tablet TAKE 1 TABLET BY MOUTH  DAILY AS NEEDED FOR FLUID  OR EDEMA 90 tablet 3   magic mouthwash (nystatin, lidocaine, diphenhydrAMINE, alum & mag hydroxide) suspension Swish and spit 5 mLs 4 (four) times daily. 180 mL 1   magic mouthwash SOLN Take 5 mLs by mouth 3 (three) times daily as needed for mouth pain. 150 mL 0   Multiple Vitamin (MULTIVITAMIN WITH MINERALS) TABS tablet Take 1 tablet by mouth daily.     mupirocin cream (BACTROBAN) 2 % Apply 1 application topically 2 (two) times daily. (Patient taking differently: Apply 1 application. topically daily as needed (Cyst).) 22 g 0   oxyCODONE (OXY IR/ROXICODONE) 5 MG immediate release tablet Take 1 tablet (5 mg total) by mouth every 4 (four) hours as needed for severe pain. 60 tablet 0   OXYGEN Inhale 4-5 L into the lungs continuous.       pantoprazole (PROTONIX) 20 MG tablet Take 1 tablet (20 mg total) by mouth daily. 90 tablet 1   Polyethyl Glycol-Propyl Glycol (SYSTANE) 0.4-0.3 % SOLN Place 1 drop into both eyes every other day.     Respiratory Therapy Supplies (FLUTTER) DEVI 10 times Twice a day and prn as needed, may increase if feeling worse 1 each  0   roflumilast (DALIRESP) 500 MCG TABS tablet TAKE 1 TABLET BY MOUTH DAILY 90 tablet 3   rosuvastatin (CRESTOR) 10 MG tablet TAKE 1 TABLET BY MOUTH  DAILY 90 tablet 3   SPIRIVA RESPIMAT 2.5 MCG/ACT AERS USE 2 INHALATIONS BY MOUTH  DAILY 12 g 3   SYMBICORT 160-4.5 MCG/ACT inhaler USE 2 INHALATIONS BY MOUTH  TWICE DAILY 30.6 g 3   triamcinolone cream (KENALOG) 0.1 % Apply 1 application. topically 2 (two) times daily. For 7-10 days maximum 80 g 0   No current facility-administered medications for this encounter.     REVIEW OF SYSTEMS: On review of systems, the patient reports that he is doing well. He is having progressive cough and shortness of breath. He's had some productive cough and is thinking of taking antibiotics and a steroid dose pack he received from [name]. He has also had muffled hearing bilaterally. No complaints of headaches, or changes in vision,  movement, or speech are noted. He continues to have back pain as a result of his compression fractures in the spine.     PHYSICAL EXAM:  Unable to assess due to encounter type   ECOG = 1  0 - Asymptomatic (Fully active, able to carry on all predisease activities without restriction)  1 - Symptomatic but completely ambulatory (Restricted in physically strenuous activity but ambulatory and able to carry out work of a light or sedentary nature. For example, light housework, office work)  2 - Symptomatic, <50% in bed during the day (Ambulatory and capable of all self care but unable to carry out any work activities. Up and about more than 50% of waking hours)  3 - Symptomatic, >50% in bed, but not bedbound  (Capable of only limited self-care, confined to bed or chair 50% or more of waking hours)  4 - Bedbound (Completely disabled. Cannot carry on any self-care. Totally confined to bed or chair)  5 - Death   Eustace Pen MM, Creech RH, Tormey DC, et al. [phone]). "Toxicity and response criteria of the [facility]". [place] Oncol. 5 (6): 649-55    LABORATORY DATA:  Lab Results  Component Value Date   WBC 10.3 12/10/2021   HGB 12.6 (L) 12/10/2021   HCT 36.9 (L) 12/10/2021   MCV 96.7 12/10/2021   PLT 236.0 12/10/2021   Lab Results  Component Value Date   NA 141 12/10/2021   K 4.6 12/10/2021   CL 98 12/10/2021   CO2 32 12/10/2021   Lab Results  Component Value Date   ALT 16 12/10/2021   AST 21 12/10/2021   ALKPHOS 60 12/10/2021   BILITOT 0.7 12/10/2021      RADIOGRAPHY: No results found.     IMPRESSION/PLAN: 1. Limited Stage , [id number], Small cell carcinoma of the RLL. The patient continues to do well with restaging scans showing improvement in his disease. He has not received radiotherapy to the brain, and prefers to continue to be followed with MRI. His MRI was reviewed and is stable. Since he remains NED by CT, we discussed extending his MRI intervals to 4-6 months. He favors 6 months time and is in agreement that he will let us know if he develops headaches, visual or speech changes, confusion, or changes in movement which would prompt scans to be performed sooner. He will also follow up with Dr. Julien Nordmann.   2. Osteopenia with thoracic spine compression fractures. He will follow with Dr. [name] regarding these changes.  3. Bilateral  mastoid effusions and concerns of COPD exacerbation. The patient has a steroid dose pack he plans to take for what sounds like COPD exacerbation and will let us know if he is still having issues.   This encounter was conducted via telephone.  The patient has provided two factor identification and has given verbal consent for this  type of encounter and has been advised to only accept a meeting of this type in a secure network environment. The time spent during this encounter was 35 minutes including preparation, discussion, and coordination of the patient's care. The attendants for this meeting include  Hayden Pedro  and [name].  During the encounter,   Hayden Pedro were located at [facility] Radiation Oncology Department.  [name] was located at home.     Carola Rhine, [facility]   **Disclaimer: This note was dictated with voice recognition software. Similar sounding words can inadvertently be transcribed and this note may contain transcription errors which may not have been corrected upon publication of note.**

## 2022-01-21 NOTE — Progress Notes (Signed)
Telephone appointment. I verified patient's identity and began nursing interview. Patient reports mid/lower back pain 5/10, and SOB (COPD induced). No other issues reported at this time.  Meaningful use complete.  Reminded patient of his 2:00pm-01/21/22 telephone appointment w/ Shona Simpson PA-C. I left my extension [phone] in case patient needs anything. Patient verbalized understanding.  Patient contact [phone]

## 2022-01-28 ENCOUNTER — Encounter: Payer: Self-pay | Admitting: Family Medicine

## 2022-01-29 ENCOUNTER — Ambulatory Visit (INDEPENDENT_AMBULATORY_CARE_PROVIDER_SITE_OTHER): Payer: Medicare Other | Admitting: Family

## 2022-01-29 ENCOUNTER — Encounter: Payer: Self-pay | Admitting: Family

## 2022-01-29 VITALS — BP 120/77 | HR 111 | Temp 98.2°F | Ht 72.0 in | Wt 174.0 lb

## 2022-01-29 DIAGNOSIS — R21 Rash and other nonspecific skin eruption: Secondary | ICD-10-CM | POA: Diagnosis not present

## 2022-01-29 MED ORDER — METHYLPREDNISOLONE ACETATE 80 MG/ML IJ SUSP
80.0000 mg | Freq: Once | INTRAMUSCULAR | Status: AC
  Administered 2022-01-29: 80 mg via INTRAMUSCULAR

## 2022-01-29 MED ORDER — HYDROXYZINE PAMOATE 25 MG PO CAPS: 25.0000 mg | ORAL_CAPSULE | Freq: Three times a day (TID) | ORAL | 0 refills | Status: DC | PRN

## 2022-01-29 NOTE — Patient Instructions (Signed)
It was very nice to see you today!  We gave you a steroid injection today for your rash. Stop the Benadryl you have been taking, I have sent over Hydroxyzine to use instead which can cause drowsiness.  Continue using the creams you have, also look at over the counter products below that may help:  Apply cool, wet compresses of plain water OR topical Burow's Solution (aluminum acetate) or Domeboro several times daily for 30 minutes, allowing water to evaporate slowly.    As an alternative, or in conjunction with wet compresses, use calamine lotion. Calamine lotion containing pramoxine (an ingredient that numbs irritated skin and helps with itching) is Aveeno Anti-Itch Lotion.    May also take oral antihistamines (Claritin, Allegra, Zyrtec) daily to help with itching.     PLEASE NOTE:  If you had any lab tests please let us know if you have not heard back within a few days. You may see your results on MyChart before we have a chance to review them but we will give you a call once they are reviewed by [place]. If we ordered any referrals today, please let us know if you have not heard from their office within the next week.

## 2022-01-29 NOTE — Progress Notes (Signed)
Patient ID: Francisco [name], male    DOB: Dec 06, 1945, 76 y.o.   MRN: [id number]  Chief Complaint  Patient presents with   Urticaria    Pt c/o hives on torso and back, Very painful and itchy. Present for 4-days. Has tried benadryl tablets, cream and Sarna and cortisone cream which are easing it off for a little but not for long.     HPI: Dermatitis: Patient complains of a rash. Symptoms began 4 days ago. Patient describes the rash as erythematous, hive-like, diffuse. Characteristics of rash and associated history: Similar rash in the past? no, Is rash pruritic?  yes, Alleviating factors? topical creams for short time. Medications currently using: topical steroid: cortisone w/aloe and benadryl cream . Reports also taking Benadryl with little relief, not helping him sleep.  Assessment & Plan:  1. Skin rash Advised to stop Benadryl, sending Hydroxyzine, advised on use & SE. Continue OTC anti-itch creams, provided list of other to try. Pt with wife and neither know where rash came from, he is under chemo tx for lung ca, does not sit outside, no other new meds other than Omnicef he takes prn for COPD exacerbation, had not had in while, took 3 doses and rash started, so he stopped the med. Advised to discuss with Oncology. Steroid injection given today, sending Hydroxyzine to use instead of Benadryl, advised on use & SE.  - methylPREDNISolone acetate (DEPO-MEDROL) injection 80 mg - hydrOXYzine (VISTARIL) 25 MG capsule; Take 1 capsule (25 mg total) by mouth every 8 (eight) hours as needed.  Dispense: 30 capsule; Refill: 0    Subjective:    Outpatient Medications Prior to Visit  Medication Sig Dispense Refill   acetaminophen (TYLENOL) 500 MG tablet Take 1,000 mg by mouth every 6 (six) hours as needed for moderate pain.      albuterol (VENTOLIN HFA) 108 (90 Base) MCG/ACT inhaler USE 2 INHALATIONS BY MOUTH  EVERY 6 HOURS AS NEEDED FOR WHEEZING 72 g 3   Calcium Carbonate-Vitamin D (CALCIUM 600+D  PO) Take 2 tablets by mouth daily.     ELIQUIS 2.5 MG TABS tablet TAKE 1 TABLET BY MOUTH  TWICE DAILY 180 tablet 3   Ferrous Sulfate (IRON) 325 (65 Fe) MG TABS Take 325 mg by mouth every other day.     fluticasone (FLONASE) 50 MCG/ACT nasal spray Place 2 sprays into both nostrils daily. 48 g 3   folic acid (FOLVITE) 1 MG tablet Take 1 tablet (1 mg total) by mouth daily.     furosemide (LASIX) 20 MG tablet TAKE 1 TABLET BY MOUTH  DAILY AS NEEDED FOR FLUID  OR EDEMA 90 tablet 3   magic mouthwash (nystatin, lidocaine, diphenhydrAMINE, alum & mag hydroxide) suspension Swish and spit 5 mLs 4 (four) times daily. 180 mL 1   magic mouthwash SOLN Take 5 mLs by mouth 3 (three) times daily as needed for mouth pain. 150 mL 0   Multiple Vitamin (MULTIVITAMIN WITH MINERALS) TABS tablet Take 1 tablet by mouth daily.     mupirocin cream (BACTROBAN) 2 % Apply 1 application topically 2 (two) times daily. (Patient taking differently: Apply 1 application  topically daily as needed (Cyst).) 22 g 0   oxyCODONE (OXY IR/ROXICODONE) 5 MG immediate release tablet Take 1 tablet (5 mg total) by mouth every 4 (four) hours as needed for severe pain. 60 tablet 0   OXYGEN Inhale 4-5 L into the lungs continuous.      pantoprazole (PROTONIX) 20 MG tablet Take  1 tablet (20 mg total) by mouth daily. 90 tablet 1   Polyethyl Glycol-Propyl Glycol (SYSTANE) 0.4-0.3 % SOLN Place 1 drop into both eyes every other day.     Respiratory Therapy Supplies (FLUTTER) DEVI 10 times Twice a day and prn as needed, may increase if feeling worse 1 each 0   roflumilast (DALIRESP) 500 MCG TABS tablet TAKE 1 TABLET BY MOUTH DAILY 90 tablet 3   rosuvastatin (CRESTOR) 10 MG tablet TAKE 1 TABLET BY MOUTH  DAILY 90 tablet 3   SPIRIVA RESPIMAT 2.5 MCG/ACT AERS USE 2 INHALATIONS BY MOUTH  DAILY 12 g 3   SYMBICORT 160-4.5 MCG/ACT inhaler USE 2 INHALATIONS BY MOUTH  TWICE DAILY 30.6 g 3   triamcinolone cream (KENALOG) 0.1 % Apply 1 application. topically 2  (two) times daily. For 7-10 days maximum 80 g 0   No facility-administered medications prior to visit.   Past Medical History:  Diagnosis Date   Chronic back pain    "mid and lower" (04/07/2018)   Chronic rhinitis    -Sinus Ct 08/01/2009 >> Bilateral maxillary sinusitis with some mucosal thickeningin the sphenoid and frontal sinuses as well with air fluid levels present -chronic rhinitis flyer Aug 04, 2009   Compressed spine fracture [facility])    COPD (chronic obstructive pulmonary disease) (HCC)    PFT's rec Jul 17, 2009   Dyspnea    Emphysema lung ([place])    On home oxygen therapy    "3L; 24/7" (04/07/2018)   Onychomycosis    Dr. Judi Cong   Orthostatic hypotension    "since 10/2017" (04/07/2018)   PAD (peripheral artery disease) ([place])    Pneumonia    "twice in 1 year" (04/07/2018)   Pulmonary embolism ([place]) 04/07/2018   Skin cancer    "lips, face, ears, arms" (04/07/2018)   Small cell lung cancer, right ([place]) 2023   Dr. Lisbeth Renshaw.  rad   Vertigo    "since ~ 02/2018" (04/07/2018)   Past Surgical History:  Procedure Laterality Date   ABDOMINAL AORTOGRAM W/LOWER EXTREMITY N/A 01/21/2020   Procedure: ABDOMINAL AORTOGRAM W/LOWER EXTREMITY;  Surgeon: Elam Dutch, MD;  Location: [place] [facility];  Service: Cardiovascular;  Laterality: N/A;   APPENDECTOMY     BIOPSY  02/20/2020   Procedure: BIOPSY;  Surgeon: Rush Landmark Telford Nab., MD;  Location: [place];  Service: Gastroenterology;;   BRONCHIAL BIOPSY  09/04/2021   Procedure: BRONCHIAL BIOPSIES;  Surgeon: Garner Nash, DO;  Location: [facility];  Service: Pulmonary;;   BRONCHIAL BRUSHINGS  09/04/2021   Procedure: BRONCHIAL BRUSHINGS;  Surgeon: Garner Nash, DO;  Location: [place];  Service: Pulmonary;;   BRONCHIAL NEEDLE ASPIRATION BIOPSY  09/04/2021   Procedure: BRONCHIAL NEEDLE ASPIRATION BIOPSIES;  Surgeon: Garner Nash, DO;  Location: [place];  Service: Pulmonary;;   CATARACT EXTRACTION, BILATERAL      ENDARTERECTOMY FEMORAL Right 04/19/2019   Procedure: ENDARTERECTOMY RIGHT COMMON FEMORAL;  Surgeon: Elam Dutch, MD;  [facility];  Service: Vascular;  Laterality: Right;   ENDARTERECTOMY FEMORAL Left 01/24/2020   Procedure: LEFT FEMORAL ENDARTERECTOMY WITH DACRON PATCH ANGIOPLASTY;  Surgeon: Elam Dutch, MD;  Location: [facility];  Service: Vascular;  Laterality: Left;   ESOPHAGOGASTRODUODENOSCOPY (EGD) WITH PROPOFOL N/A 02/20/2020   Procedure: ESOPHAGOGASTRODUODENOSCOPY (EGD) WITH PROPOFOL;  Surgeon: Irving Copas., MD;  Location: [place];  Service: Gastroenterology;  Laterality: N/A;   FEMORAL ENDARTERECTOMY Left 01/24/2020   FIDUCIAL MARKER PLACEMENT  09/04/2021   Procedure: FIDUCIAL MARKER PLACEMENT;  Surgeon: Garner Nash, DO;  Location: [place];  Service: Pulmonary;;   HEMOSTASIS CLIP PLACEMENT  02/20/2020   Procedure: HEMOSTASIS CLIP PLACEMENT;  Surgeon: Irving Copas., MD;  Location: [place];  Service: Gastroenterology;;   HOT HEMOSTASIS N/A 02/20/2020   Procedure: HOT HEMOSTASIS (ARGON PLASMA COAGULATION/BICAP);  Surgeon: Irving Copas., MD;  Location: [place];  Service: Gastroenterology;  Laterality: N/A;   INSERTION OF ILIAC STENT Left 01/24/2020   Procedure: INSERTION OF VBX STENT 8X59 AND 8X39 LEFT COMMON ILIAC ARTERY. INSERTION OF INNOVA 7 X 60 INNOVA STENT LEFT EXTERNAL ILIAC ARTERY. ;  Surgeon: Elam Dutch, MD;  Location: [place];  Service: Vascular;  Laterality: Left;   LOWER EXTREMITY ANGIOGRAPHY  12/11/2018   LOWER EXTREMITY ANGIOGRAPHY N/A 12/11/2018   Procedure: LOWER EXTREMITY ANGIOGRAPHY;  Surgeon: Elam Dutch, MD;  Location: [place] [facility];  Service: Cardiovascular;  Laterality: N/A;   PATCH ANGIOPLASTY Right 04/19/2019   Procedure: Patch Angioplasty;  Surgeon: Elam Dutch, MD;  Location: [facility] OR;  Service: Vascular;  Laterality: Right;   PERIPHERAL VASCULAR INTERVENTION Right 12/11/2018   Procedure:  PERIPHERAL VASCULAR INTERVENTION;  Surgeon: Elam Dutch, MD;  Location: [place] [facility];  Service: Cardiovascular;  Laterality: Right;  Common Iliac    SKIN CANCER EXCISION     "lips, face, ears, arms" (04/07/2018)   TONSILLECTOMY     ULTRASOUND GUIDANCE FOR VASCULAR ACCESS Right 01/24/2020   Procedure: ULTRASOUND GUIDANCE FOR VASCULAR ACCESS;  Surgeon: Elam Dutch, MD;  Location: [place];  Service: Vascular;  Laterality: Right;   VIDEO BRONCHOSCOPY WITH RADIAL ENDOBRONCHIAL ULTRASOUND  09/04/2021   Procedure: VIDEO BRONCHOSCOPY WITH RADIAL ENDOBRONCHIAL ULTRASOUND;  Surgeon: Garner Nash, DO;  Location: [place];  Service: Pulmonary;;   Allergies  Allergen Reactions   Tape Other (See Comments)    SKIN IS VERY THIN, TEARS SKIN; CAN ONLY USE COBAN WRAPS DUE TO CONDITION OF SKIN!!   Amoxicillin-Pot Clavulanate Other (See Comments)    Thrush/ Sore throat/Voice became hoarse   Ciprofloxacin Nausea Only    Sick on stomach, weak/tired   Levaquin [Levofloxacin] Other (See Comments)    hallucinations   Lipitor [Atorvastatin] Rash      Objective:    Physical Exam Vitals and nursing note reviewed.  Constitutional:      General: He is not in acute distress.    Appearance: Normal appearance.  HENT:     Head: Normocephalic.  Cardiovascular:     Rate and Rhythm: Normal rate and regular rhythm.  Pulmonary:     Effort: Pulmonary effort is normal.     Breath sounds: Normal breath sounds.  Musculoskeletal:        General: Normal range of motion.     Cervical back: Normal range of motion.  Skin:    General: Skin is warm and dry.     Findings: Rash (diffuse, erythema bumps on torso front and back) present.  Neurological:     Mental Status: He is alert and oriented to person, place, and time.  Psychiatric:        Mood and Affect: Mood normal.    BP 120/77 (BP Location: Left Arm, Patient Position: Sitting, Cuff Size: Large)   Pulse (!) 111   Temp 98.2 F (36.8 C)  (Temporal)   Ht 6' (1.829 m)   Wt 174 lb (78.9 kg)   SpO2 95% Comment: oxygen on a 4  BMI 23.60 kg/m  Wt Readings from Last 3 Encounters:  01/29/22 174 lb (78.9 kg)  12/10/21 174 lb (78.9 kg)  12/05/21 174 lb 9.6 oz (79.2 kg)       Jeanie Sewer, NP

## 2022-01-30 ENCOUNTER — Telehealth: Payer: Self-pay | Admitting: Internal Medicine

## 2022-01-30 NOTE — Telephone Encounter (Signed)
Called patient regarding upcoming December appointments, left a voicemail.

## 2022-02-04 ENCOUNTER — Other Ambulatory Visit: Payer: Self-pay | Admitting: Family Medicine

## 2022-03-01 ENCOUNTER — Encounter: Payer: Self-pay | Admitting: Family Medicine

## 2022-03-06 NOTE — Patient Instructions (Signed)
Nodule of lower lobe of right lung - 73mm new Jul 10, 2021. 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  Plan - per DR Julien Nordmann and Lisbeth Renshaw - CT scan timing  COPD with acute exacerbation [facility]) COPD, frequent exacerbations ([place])  - no flare up but could be at risk in future few days  Plan - check ABG -(last checked 2021) - if CO2 up then bipap night will be bneficial - check CBC with diff and blood IgE 12/05/2021 - if eos high - can consider dupixemnt - Keep handly - send to [facility] - omnicef 300mg  twice daily  5 days  -  Take prednisone 40 mg daily x 2 days, then 20mg  daily x 2 days, then 10mg  daily x continue baseline dose - continue  daily prednisone 10mg , azithromycin MWF, and roflumilast daily per routine   Stage 4 very severe COPD by GOLD classification ([place]) Chronic respiratory failure with hypoxia ([place])  Plan  --Continue oxygen, Spiriva, Symbicort, Daliresp and 3 times weekly azithromycin and daily prednisone otherwise   History Pulmonary embolism -   -normal d-dimer Jan 2023  Plan  - - continue low dose eliquis for now esp with cancer and leg edema    Follow-up -3 months with dR [name]

## 2022-03-06 NOTE — Progress Notes (Unsigned)
ROV 12/30/16 -- patient has a history of tobacco use, COPD currently maintained on chronic prednisone, Symbicort, Spiriva. Most recent blurry function testing was March 2011 with an FEV1 of 1.2 L (34% Pred), severe obstruction. He up titrates his prednisone on his own based on symptoms. No abx since last time. He has increased pred temporrily about 4 -5 x since last time. No increases for over a month. He is smoking a pipe, not cigarettes. He is using HCTZ more frequently these days. He is on flonase and singulair. He does note that he has some slow progression of his DOE. He coughs a few times a day, clears clear sputum.   rov 07/17/17 --this is a follow-up visit for patient with active tobacco use, severe COPD and chronic prednisone use.  He often titrates his prednisone on his own depending on how he is feeling. He complains of a new pain in his back below his left shoulder blade, has been present for the last 10 days. sometimes pleuritic, can be stabbing, worse w cough. His pred use: on 65m for the last 19 days. No hx VTE. He has LE edema, venous stasis changes, increased since his diuretics decreased.   ROV 09/23/17 --76year old man with a history of tobacco use (still smokes pipe), severe COPD and severe obstruction on spirometry.  At his last visit he had some pleuritic left back pain and I performed a CT PA that I have reviewed from 07/17/17.  There was no pulmonary embolism but extensive emphysema present, some bilateral apical scar more so on the right, stable.  He uses prednisone 10 mg daily and uptitrate depending on his day-to-day symptoms.  He is otherwise managed on Symbicort and Spiriva.  Desaturated on arrival today after ambulating to the exam room. He reports more dyspnea, has increased pred intermittently.    OV 12/05/2017  Chief Complaint  Patient presents with   Follow-up    Switching from RB to MR.   Pt is on 3L with exertion and 2L at rest. Pt states his breathing had become  worse but since he has been on O2 since 3/21, it has really helped with his breathing.  Pt was in hospital 4/28-5/3. Other than SOB, pt does have c/o cough, rattling in chest. Denies any CP/chest tightness.   History from patient, his wife and review of the old chart  Francisco [name] a transfer of care from Francisco [name] myself Francisco [name]  His son is my patient and therefore he is done this transfer of care.  According to the patient he is to be seen by Dr. CGwenette Greetfor COPD.  Patient is FEV1 34% based on March 2011 PFTs according to chart review.  At baseline he is maintained on Spiriva, Symbicort, Singulair, chronic daily prednisone 10 mg/day for at least 3 years, and 3 times a week of azithromycin for 2 years.  Earlier this year in March 2019 he went on daytime oxygen and following a recent hospitalization April-May 2019.  For syncope that was believed due to nocturnal hypoxemia he was started on night oxygen.  Since starting oxygen his edema has improved and his overall quality of life is improved and he stopped having any orthostasis or presyncopal episodes.  He is grateful for the fact that he is on oxygen.  His current COPD CAT score and severity of symptoms is rated below and is 26.  Show significant amount of symptom burden.  His main goal is to improve his quality  of life.  He had his wife have many questions centering around quality of life, medication therapy.  Of note he has had some thoracic vertebral fractures that are new.  This happened after the fall in late April 2019.  Discovered at admission last month.  He feels is due to prednisone.  He is wondering about portable oxygen.   OV 01/29/2018  Chief Complaint  Patient presents with   Follow-up    Pt states he has been doing okay since last visit. States breathing is about the same, has an occ cough but states the O2 has helped out a lot with breathing and cough. Denies any complaints of CP.   Francisco [name] , 76 y.o. , with  dob 08-12-1945 and male ,Not Hispanic or Latino from [place] [address] - presents to lung clinic for advanced COPD follow-up. Since his last visit his symptoms course of improvement. Score is 26 and severe symptoms of document below. He is on Spiriva, Symbicort, continues oxygen, daily prednisone and azithromycin 3 times a week. He has stopped his singulair without any problems. Overall he says he is better. He is willing to give TRELEGY inhaler trial. Last visit to check blood gas and he does not have hypercapnia so he does not qualify for BiPAP        OV 04/03/2018  Subjective:  Patient ID: Francisco [name], male , DOB: [birth year] , age 76 y.o. , MRN: [id number] , ADDRESS: [place] [address]   04/03/2018 -   Chief Complaint  Patient presents with   Acute Visit    Pt's O2 sats have been dropping into the 70s with little exertion.  Pt took O2 off just to shave 9/26 and after he finished shaving put the O2 back on, walked from the bathroom down to the living room on O2 and sats were at 77% on 3L 9/26. Pt also has c/o cough with white to yellow mucus and chest tightness with the SOB.     HPI Francisco [name] 76 y.o. -acute visit for this patient.  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  He is chronically prednisone dependent.  He also is on schedule azithromycin.  He called in yesterday feeling unwell therefore we asked him to come in today.  He tells me that approximately a week ago he started having increased cough and congestion.  Yesterday primary care physician following a routine visit thought he was in COPD exacerbation and started him on doxycycline.  He also personally bumped up his baseline prednisone.  He does me that yesterday while he was shaving on room air he felt more short of breath than usual.  Also when he walked he desaturated more than usual therefore he decided to call in and come in today.  There  is no fever or chills or hemoptysis or colored sputum.  No leg edema.  No orthopnea.  He does not feel like he needs to be in the emergency department or get admitted.   OV 05/11/2018  Subjective:  Patient ID: Francisco [name], male , DOB: 1946-01-26 , age 82 y.o. , MRN: [id number] , ADDRESS: [place] [address]   05/11/2018 -   Chief Complaint  Patient presents with   Follow-up    pt states breathing has wrosen since last OV. increased sob, occ chest tightness, prod cough with yellow mucus & chest  congestion. currently taking trelegy samples. recent admission 04/07/18 for PE.     HPI Francisco [name] 76 y.o. -advanced COPD with chronic hypoxemic respiratory failure [not a BiPAP candidate because of lack of hypercapnia] presents with his wife for follow-up.  After the last visit he continued to have symptoms so on April 07, 2018 he got an outpatient CT scan and he had pulmonary embolism.  He got admitted to the hospital and then discharged and since then has been on Xarelto.  He continues his baseline 2-3 L of nasal cannula oxygen but he says since then he is more symptomatic.  Is also corresponds with him starting Trelegy inhaler and also for the last few to several weeks is having increased cough and congestion and yellow phlegm.  His COPD CAT score is deteriorated to 35 and above.  He is very frustrated by his symptoms.  Review of his medication shows that he takes azithromycin and chronic prednisone and Trelegy and oxygen for his COPD.  He is on Xarelto for his blood clots.  He is also on Fosamax for prednisone dependent osteoporosis management.  His smoking is in remission.  Wife says he is always sedentary.  Apparently they tried to do some physical therapy a month ago but he desaturated.  He does not seem to keen on physical therapy right now     OV 06/10/2018  Subjective:  Patient ID: Francisco [name], male , DOB: 12-13-1945 , age 29 y.o. , MRN: [id number] ,  ADDRESS: [place] [address]   06/10/2018 -   Chief Complaint  Patient presents with   Follow-up    follows for COPD. patient has increased SOB with exertion     HPI Francisco [name] 76 y.o. -returns for gold stage IV COPD follow-up with chronic hypoxemic respiratory failure.  I saw him approximately a month ago at which time recommended Trelegy.  However the Trelegy is not working well for him.  He feels Spiriva and Symbicort is of benefit for him.  Also recommended he start himself on Daliresp.  Given the GI side effect profile I communicated with his primary care physician about stopping Fosamax.  His primary care physician has advised Reclast infusion for him and is considering this but has not yet switched over.  This because the infusions will have to happen at [facility] long.  However he never started his Daliresp because he read the side effect profile and was worried about back pain.  He wanted me to go over the side effect profile of Daliresp all over again.  I printed up-to-date and went over all the side effects.  I did this with him and his wife.  There is a 3% incidence of backache.  The dominant feature is GI issues of weight loss and diarrhea and nausea and abdominal pain.  The second dominant feature is some anxiety issues.  After reading all this he has decided to start [place].  He is aware of the limitations as well.  He is aware that this is purely preventive in reducing COPD exacerbation frequency.  He understands that the burden on his health from frequent COPD exacerbations is high.  Currently COPD CAT score is back at baseline        OV 07/22/2018  Subjective:  Patient ID: Francisco [name], male , DOB: [birth year] , age 58 y.o. , MRN: [id number] , ADDRESS: [place] [address]   07/22/2018 -  Chief Complaint  Patient presents with   Follow-up    Pt states it has been rough since last visit. States he has been coughing with  white to yellow phlegm, wheezing, increased SOB which has been going on x2-3 weeks now. Pt denies any real complaints of chest tightness/chest pain.     HPI Francisco [name] 76 y.o. -presents for follow-up of his advanced COPD with chronic hypoxemic respiratory failure.  At last visit we introduce Daliresp as is an effort to prevent COPD flareups.  After some trepidation he started taking it.  He is currently on full dose 5 mcg daily and is tolerating it well.  However he does not think it is reduced the frequency of flareups but again it is too soon to tell.  Approximately over a week ago after some sick contact exposure he had symptoms and signs of COPD exacerbation.  He was given I suspect Levaquin but this caused some confusion.  After that he was changed to doxycycline.  Today's his last of 7 days with doxycycline he is better.  COPD CAT score is improved to 26 but he still is coughing quite a bit and has mucus and feels that he is still not fully back to baseline.  He had a chest x-ray July 13, 2018 that reports this is clear.  He continues on oxygen Spiriva and Symbicort.  There are no other new issues.  No chest pain edema hemoptysis or weight loss.    OV 03/05/2019  Subjective:  Patient ID: Francisco [name], male , DOB: 1946/02/08 , age 66 y.o. , MRN: [id number] , ADDRESS: [place] [address]   03/05/2019 -   Chief Complaint  Patient presents with   COPD with acute exacerbation    Feels it is a little worse since June. Has cough with congestion, was white in color. But this morning it has a yellow and green color to it.     HPI Francisco [name] 76 y.o. -returns for his advanced COPD follow-up.  He says that since his last visit he has been doing overall fair and stable.  Uses 3 L of oxygen, Daliresp, Spiriva, Symbicort.  He has been avoiding risk activities for COVID-19.  He says in the last few weeks he has had increased congestion he does not think this is  COVID-19.  Today he has green-yellow sputum.  He is asking about taking a flu shot.    OV 05/07/2019  Subjective:  Patient ID: Francisco [name], male , DOB: 1946/02/28 , age 81 y.o. , MRN: [id number] , ADDRESS: [place] [address]   05/07/2019 -   Chief Complaint  Patient presents with   Follow-up    Pt states his breathing is at his baseline. Pt c/o prod cough with clear mucus - baseline for pt. Pt denies CP/tightness and f/c/s.    Advance COPD with chronic hypoxemic respiratory failure.  Baseline functional status ECOG 3-4  HPI Francisco [name] 76 y.o. -presents for COPD follow-up.  He continues to be stable using oxygen.  His current COPD CAT score is 30 which is his baseline.  He recently had a vascular surgery.  He is on oxygen, prednisone, Daliresp, Spiriva and Symbicort.  He is gone back to taking azithromycin for prophylaxis m again COPD exacerbation.        OV 11/11/2019  Subjective:  Patient ID: Francisco [name], male , DOB: 1945-10-13 ,  age 70 y.o. , MRN: [id number] , ADDRESS: [place] [address]   11/11/2019 -   Chief Complaint  Patient presents with   Follow-up    Pt states his breathing is progressively becoming worse and states he feels like he may be in a flare with the COPD. Pt states he is coughing up yellow-green phlegm and also has complaints of wheezing.   Advance COPD patient with recurrent COPD exacerbations.  HPI Francisco [name] 76 y.o. -presents for follow-up.  He says last month he saw [name] nurse practitioner.  He says he was given a different antibiotic that really helped.  Review of the records indicate one time it was Augmentin another time it was [place].  He believes the second 1 really helped.  He feels in another exacerbation.  He is having green sputum.  He says with the flutter valve and exacerbation a lot of sputum comes out but between exacerbations is very minimal.  He is frustrated by his  recurrent exacerbations.  He did not tolerate Trelegy in the past.  He is on Spiriva, Symbicort, oxygen 3 L, flutter valve, Flonase, Roflumilast and 3 times a week of azithromycin.  He is looking to see if he can improve his quality of life.  His COPD CAT score is 34.  His last CT scan of the chest was in 2019.     CAT Score 11/11/2019 03/05/2019 04/03/2018  Total CAT Score 32 30 30     OV 11/26/2019 - telephone visit. 2PHI identified. Risks, benefit, limitation explained  Subjective:  Patient ID: Francisco [name], male , DOB: 08/17/1945 , age 41 y.o. , MRN: [id number] , ADDRESS: [place] [address] Advanced COPD with recurrent exacerbation now colonized by pseudomonas  11/26/2019 -    HPI Francisco [name] 76 y.o. -last seen by myself Nov 11, 2019.  At that time we decided to do a work-up for recurrent exacerbations.  His immunoglobulin levels are normal.  His sputum grew Pseudomonas that is pansensitive.  He CT scan of the chest shows right upper lobe worsening fibrotic pattern.  There is no mass per se.  I personally visualized the CT chest.  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  Today in the visit he tells me after 3 days he had nausea and then he had joint pain on the third day and quit taking it.  He still continues to be symptomatic.     ROS - per HPI  CT Chest Wo Contrast  Result Date: 11/25/2019 CLINICAL DATA:  COPD exacerbation. Severe shortness of breath. EXAM: CT CHEST WITHOUT CONTRAST TECHNIQUE: Multidetector CT imaging of the chest was performed following the standard protocol without IV contrast. COMPARISON:  04/07/2018. FINDINGS: Cardiovascular: The heart size is normal. There is no pericardial effusion. Aortic atherosclerosis. Three vessel coronary artery atherosclerotic calcifications identified Mediastinum/Nodes: No enlarged mediastinal or axillary  lymph nodes. Thyroid gland, trachea, and esophagus demonstrate no significant findings. Lungs/Pleura: No pleural effusion identified. Advanced changes of centrilobular and paraseptal emphysema with mild bullous changes. Progressive confluent fibrotic with extensive architectural distortion and volume loss no acute airspace consolidation, atelectasis or pneumothorax. Scattered, bilateral areas of pleuroparenchymal scarring identified changes within the apical portions of the right upper lobe are again noted. Upper Abdomen: Bilateral low-attenuation adrenal nodules appears similar to previous exam and are compatible with benign  adenomas. No acute abnormality identified within the imaged portions of the upper abdomen. Musculoskeletal: Mild scoliosis and degenerative disc disease noted within the thoracic spine. Stable chronic compression deformities at T6, T7 and T8. No new compression fractures. IMPRESSION: 1. No acute cardiopulmonary abnormalities. 2. Progressive confluent fibrotic changes within the right upper lobe with extensive architectural distortion and volume loss. Likely the sequelae of prior inflammation/infection. 3. Aortic atherosclerosis, in addition to 3 vessel coronary artery disease. Please note that although the presence of coronary artery calcium documents the presence of coronary artery disease, the severity of this disease and any potential stenosis cannot be assessed on this non-gated CT examination. Assessment for potential risk factor modification, dietary therapy or pharmacologic therapy may be warranted, if clinically indicated. 4. Bilateral adrenal adenomas. 5. Stable chronic compression deformities at T6, T7 and T8. 6. Diffuse bronchial wall thickening with emphysema, as above; imaging findings suggestive of underlying COPD. Aortic Atherosclerosis (ICD10-I70.0) and Emphysema (ICD10-J43.9). Electronically Signed   By: Kerby Moors M.D.   On: 11/25/2019 12:21     OV  05/03/2020  Subjective:  Patient ID: Francisco [name], male , DOB: 1945/12/01 , age 35 y.o. , MRN: [id number] , ADDRESS: [place] [address] PCP [name], MD Patient Care Team: [name], MD as PCP - General (Family Medicine) Brand Males, MD as Consulting Physician (Pulmonary Disease) Danella Sensing, MD as Consulting Physician (Dermatology) Dr. Henreitta Leber, DDS (Dentistry)  This Provider for this visit: Treatment Team:  Attending Provider: Brand Males, MD    05/03/2020 -   Chief Complaint  Patient presents with   Follow-up    doing best he has in a year and a half.     ICD-10-CM   1. Stage 4 very severe COPD by GOLD classification ([place])  J44.9   2. Chronic respiratory failure with hypoxia (HCC)  J96.11   3. COPD, frequent exacerbations ([place])  J44.1   4. Abnormal findings on diagnostic imaging of lung  R91.8   5. Pulmonary embolism, unspecified chronicity, unspecified pulmonary embolism type, unspecified whether acute cor pulmonale present (HCC)  I26.99   6. Vaccine counseling  Z71.85      HPI Francisco [name] 76 y.o. -presents with his wife for the above issues.  He said his Covid booster has had his flu shot.  He has follow-up coming end of November with primary care physician.  He wants me to send a note to the primary care physician.  He is without any exacerbations.  He continues prednisone, azithromycin, Roflumilast, Spiriva and Symbicort.  He does not want to do triple inhaler therapies.  He is on anticoagulation.  He says after the GI bleeding issues resolved he is better.  He had a CT scan of the chest that is documented below.  His infiltrates are improving but there is mucus/debris in the main airway he is denying any aspiration.  He does have hiatal hernia.  Apparently this was discovered during endoscopy.  CAT Score 05/03/2020 11/11/2019 03/05/2019 04/03/2018  Total CAT Score 16 32 30 30     IMPRESSION: 1. Trace right  pleural effusion and associated atelectasis or consolidation, both of which are improved compared to prior examination. Findings are generally consistent with resolving sequelae of prior infection or aspiration. 2. Diffuse bilateral bronchial wall thickening consistent with nonspecific infectious or inflammatory bronchitis. 3. Frothy debris in the bilateral mainstem bronchi, concerning for aspiration. 4. Severe centrilobular emphysema. Emphysema (ICD10-J43.9). 5. Nonspecific fibrotic  scarring of the right greater than left lung apices, likely sequelae of prior infection or inflammation. 6. Coronary artery disease.  Aortic Atherosclerosis (ICD10-I70.0).     Electronically Signed   By: Eddie Candle M.D.   On: 04/26/2020 13:42   ROS - per HPI  OV 08/31/2020  Subjective:  Patient ID: Francisco [name], male , DOB: 11/11/1945 , age 45 y.o. , MRN: [id number] , ADDRESS: [place] [address] PCP [name], MD Patient Care Team: [name], MD as PCP - General (Family Medicine) Brand Males, MD as Consulting Physician (Pulmonary Disease) Danella Sensing, MD as Consulting Physician (Dermatology) Dr. Henreitta Leber, DDS (Dentistry)  This Provider for this visit: Treatment Team:  Attending Provider: Brand Males, MD    08/31/2020 -   Chief Complaint  Patient presents with   Follow-up    More congestion at the moment, more SOB   Advanced end-stage COPD on oxygen and ECOG 3-4 History of pulmonary embolism on anticoagulation  HPI Francisco [name] 76 y.o. -presents for follow-up.  Presents with his wife he sitting on wheelchair.  He says for the last few weeks he is having a flareup with increased urine production that is green.  He wants antibiotics.  He says in December 2021 I switched his Omnicef to ciprofloxacin based on Pseudomonas and sensitivity but he actually felt the [place] worked better.  However he said that the duration again was too  short and he actually wants Omnicef again for 14 days at this time.  He also thinks a prednisone taper/burst would help him.  Otherwise he continues on Spiriva Symbicort Daliresp 3 times weekly azithromycin and daily prednisone 10 mg.  Is also on oxygen.  He is on anticoagulation for his pulmonary embolism.  He fell down and broke his back but did not have any bleeding.  He is going to have an MRI.  Noted that he is not on any Mucinex and is going to start this after my advice.  We discussed about switching his Breo and Symbicort to a triple inhaler combination either Trelegy or BREZTRI but he says both of not working he does not want to do it.   OV 04/26/2021  Subjective:  Patient ID: Francisco [name], male , DOB: 09/17/1945 , age 24 y.o. , MRN: [id number] , ADDRESS: [address] [place] [address] PCP [name], MD Patient Care Team: [name], MD as PCP - General (Family Medicine) Brand Males, MD as Consulting Physician (Pulmonary Disease) Danella Sensing, MD as Consulting Physician (Dermatology) Dr. Henreitta Leber, DDS (Dentistry)  This Provider for this visit: Treatment Team:  Attending Provider: Brand Males, MD    04/26/2021 -   Chief Complaint  Patient presents with   Follow-up    Pt states he is about the same since last visit. Has problems with coughing and will get up occ white to yellow phlegm and also has had some wheezing.    Advanced end-stage COPD on oxygen and ECOG 3-4 - last CT Oct 2021 History of pulmonary embolism on anticoagulation - June 2021 Iron def anemia - follows Dr Julien Nordmann  HPI Francisco [name] 76 y.o. -returns for follow-up with his wife.  His ECOG continues to be 3-4.  He is wheelchair and minimal activities.  He is on oxygen, Spiriva, Symbicort, azithromycin for preventing COPD exacerbations, Daliresp, daily prednisone.  Is not using 3% saline nebulizer.  Instead he says flutter valve  works well for him.  Last seen in  February 2022 and since then no exacerbations.  The longest time he has had without exacerbation he is happy about it.  He has some waxing and waning sputum production.  Few days ago it was yellow but now clear.  He is going to monitor this.  He is up-to-date with his COVID vaccines.  He is no longer following with Dr. Julien Nordmann for his iron deficiency anemia.  He is on anticoagulation for his PE in June 2021.    CT Chest data  No results found.    PFT  No flowsheet data found.  OV 07/26/2021  Subjective:  Patient ID: Francisco [name], male , DOB: May 28, 1946 , age 88 y.o. , MRN: [id number] , ADDRESS: [address] [place] [address] PCP [name], MD Patient Care Team: [name], MD as PCP - General (Family Medicine) Brand Males, MD as Consulting Physician (Pulmonary Disease) Danella Sensing, MD as Consulting Physician (Dermatology) Dr. Henreitta Leber, DDS (Dentistry)  This Provider for this visit: Treatment Team:  Attending Provider: Brand Males, MD    07/26/2021 -   Chief Complaint  Patient presents with   Follow-up    Pt is here to discuss results of recent CT.    Advanced end-stage COPD on oxygen and ECOG 3-4 - last CT Oct 2021 History of pulmonary embolism on anticoagulation - June 2021 Iron def anemia - follows Dr Julien Nordmann Right lower lobe superior segment nodule - 9 m -January 2023 new onset  HPI Francisco [name] 76 y.o. -follows for his COPD.  Presents with his wife.  He continues on his oxygen at 4 L.  He feels he is beginning to have a COPD exacerbation.  He did have a CT scan of the chest that was routine.  It shows a 9 mm right lower lobe superior segment nodule.  I looked at it it looks solid and lobulated.  I think there is a fair probability greater than 65% this is malignancy.  He is asymptomatic from it.  He just wants preemptive antibiotics and prednisone burst for his incipient COPD exacerbation.  In terms of his pulmonary  embolism from 2 and half years ago he continues his Eliquis.  The Eliquis at low-dose for prophylaxis.  His D-dimer in the past has been normal.  But we decided to continue the low-dose Eliquis.  We will check his D-dimer again.    CAT Score 04/26/2021 08/31/2020 05/03/2020 11/11/2019  Total CAT Score _0 32        CT chest 07/10/21  Narrative & Impression  CLINICAL DATA:  COPD exacerbation. Respiratory failure with hypoxia.   EXAM: CT CHEST WITHOUT CONTRAST   TECHNIQUE: Multidetector CT imaging of the chest was performed following the standard protocol without IV contrast.   COMPARISON:  04/25/2020   FINDINGS: Cardiovascular: The heart size is within normal limits. No pericardial effusion. Aortic atherosclerosis and coronary artery calcifications.   Mediastinum/Nodes: No enlarged mediastinal or axillary lymph nodes. Thyroid gland, trachea, and esophagus demonstrate no significant findings.   Lungs/Pleura: Severe changes of centrilobular and paraseptal emphysema. Diffuse bronchial wall thickening. No pleural effusion or acute airspace consolidation. No interstitial edema. Scarring and masslike architectural distortion within the right apex appears unchanged from the previous exam. Within the superior segment of right lower lobe there is a lung nodule measuring 1.1 x 0.6 cm (mean diameter 9 mm), image 82/3. New compared with the previous exam.  Upper Abdomen: No acute findings within the imaged portions of the upper abdomen. Aortic atherosclerosis.   Musculoskeletal: Curvature of the thoracic spine is convex towards the right. Compression fractures are again noted involving T6, T7 and T8. Unchanged from previous exam.   IMPRESSION: 1. No acute cardiopulmonary abnormalities. 2. There is a new pulmonary nodule within the superior segment of right lower lobe with a mean diameter of 9 mm. Consider a non-contrast Chest CT at 3 months, a PET/CT, or tissue  sampling. These guidelines do not apply to immunocompromised patients and patients with cancer. Follow up in patients with significant comorbidities as clinically warranted. For lung cancer screening, adhere to Lung-RADS guidelines. Reference: Radiology. 2017; 284(1):228-43. 3. Stable scarring and masslike architectural distortion within the right apex. 4. Diffuse bronchial wall thickening with emphysema, as above; imaging findings suggestive of underlying COPD. 5. Stable thoracic compression fractures. 6. Aortic Atherosclerosis (ICD10-I70.0) and Emphysema (ICD10-J43.9).     Electronically Signed   By: Kerby Moors M.D.   On: 07/10/2021 13:33       09/11/2021 Pt. Presents for follow up. He had Flexible video fiberoptic bronchoscopy with robotic assistance and biopsies on 09/04/2021. He states he has done well after his biopsy. No significant bleeding. Just a scan amount the day after. He did have a lot of rib pain , that has subsequently improved. I gave the patient the biopsy results. We discussed this in full to include treatment options and all questions were asked and answered. [name] Kitchen   He says he is currently in the middle of a COPD flare. He has thick yellow green secretions .He also endorses a bad cough.  He does have a flutter valve. He does endorse some wheezing.He resumed his Eliquis on 09/06/2021.We will treat him for his COPD flare today.He is compliant with his Daliresp, Spiriva and Symbicort.    I have referred to radiation oncology, as he is oxygen dependent and in a wheelchair, SBRT is best option for this single nodule. . Both he and his wife had  questions that were answered. I have asked them to call the office if they need anything at all.   Test Results: Cytology 09/04/2021 MICROSCOPIC DIAGNOSIS:  A. 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  Immunohistochemistry shows  patchy positivity with cytokeratin 5/6 and weak positivity with CD56.  The malignant cells are negative with TTF-1, chromogranin,  synaptophysin, p40 and p63.  Ki-67 shows a high proliferation rate.   PET scan 08/08/2021 Solitary hypermetabolic pulmonary nodule in the RIGHT lower lobe with intense metabolic activity is most consistent PRIMARY BRONCHOGENIC CARCINOMA. 2. No mediastinal adenopathy, supraclavicular adenopathy or distant metastatic disease. No skeletal metastasis.     OV 12/05/2021  Subjective:  Patient ID: Francisco [name], male , DOB: [birth year] , age 82 y.o. , MRN: [id number] , ADDRESS: [address] [place] [id number] PCP [name], MD Patient Care Team: [name], MD as PCP - General (Family Medicine) Brand Males, MD as Consulting Physician (Pulmonary Disease) Danella Sensing, MD as Consulting Physician (Dermatology) Dr. Henreitta Leber, DDS (Dentistry)  This Provider for this visit: Treatment Team:  Attending Provider: Brand Males, MD    12/05/2021 -   Chief Complaint  Patient presents  with   Follow-up    Pt states since his last radiation treatment, his breathing has become a little worse. States his cough is about the same.    HPI Francisco [name] 76 y.o. -return to see me.  I presents with his wife.  I am seeing him for the first time after the diagnose of cancer.  He said radiation 3 times.  He says after the radiation he is a little bit more short of breath.  He is now needing 4 L nasal cannula.  When he exerts he can desaturate into the 80s and sometimes into the 70s.  He is able to recover with resting.  His amount of cough and sputum is around the same.  It is between clear and green and yellow.  He is not in a COPD exacerbation currently but according to his wife it appears that he might get  one by next week.  She says whenever the antibiotic cephalosporin is on and prednisone he is doing great but otherwise he will have a flareup.  He is already on azithromycin 3 times a week and also Roflumilast for prevention of COPD flareup.  Despite this he gets recurrent COPD flareup.  Reviewed his eosinophilic profile.  Prior to prednisone some of the eosinophils were as high as 300 cells per cubic millimeter.  We did discuss about the fact that [place] might be effective but is eosinophilic count can be obliterated by the prednisone.  He is willing to try this if he has eosinophilia or if you are able to get insurance justification for COPD/asthma overlap.  He does behave like COPD and asthma overlap.  Currently his medications include Symbicort, Spiriva, Daliresp, azithromycin, Flonase and oxygen  He and his wife are requesting a prescription for COPD exacerbation be sent   He has upcoming appointment Dr. Julien Nordmann.  He does not think he will be a good candidate for chemotherapy CT Chest data  No results found.      OV 03/07/2022  Subjective:  Patient ID: Francisco [name], male , DOB: [birth year] , age 70 y.o. , MRN: [id number] , ADDRESS: [address] [place] [phone] PCP [name], MD Patient Care Team: [name], MD as PCP - General (Family Medicine) Brand Males, MD as Consulting Physician (Pulmonary Disease) Danella Sensing, MD as Consulting Physician (Dermatology) Dr. 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  Valeta Harms  -Status post SBRT by Dr. Lisbeth Renshaw April 2023 03/07/2022 -   Chief Complaint  Patient presents with   Follow-up    Pt states he has been doing about the same since last visit.     HPI Francisco [name] 76 y.o. -returns for follow-up of his COPD.  Since last visit no flareups.  He is doing stable.  Overall the same.  Continues all his medications.  Ordered blood gas last time but for some reason it was not done.  We did blood eosinophils and it was 200 cells per cubic millimeter suppressed by prednisone.  He did not qualify for ipilimumab.  I recommended we do this again and he is willing.  He has upcoming labs with Dr. Garret Reddish March 14, 2022.  I have sent a secure chat to Dr. Yong Channel to get this done.  We talked about getting the full vaccines of RSV, flu and COVID.  He plans to do that.  No new issues.  His wife is here with him.    ABG   Latest Reference Range & Units 12/05/17 10:20 04/15/19 14:06 01/24/20 15:13 01/26/20 12:29  pH, Arterial 7.350 - 7.450  7.420 7.423 7.454 (H) 7.446  pCO2 arterial 32.0 - 48.0 mmHg 42.6 41.0 50.6 (H) 43.0  pO2, Arterial 83.0 - 108.0 mmHg 92.0 108 141 (H) 79.1 (L)  (H): Data is abnormally high (L): Data is abnormally low  PFT      No data to display          Latest Reference Range & Units 10/31/08 13:57 02/12/10 08:32 01/31/11 08:26 10/23/11 09:34 01/15/12 08:08 02/09/13 09:14 02/25/14 08:55 06/05/16 08:23 12/06/16 07:54 06/25/17 08:19 12/25/17 08:50 04/03/18 11:48 04/07/18 11:46 06/02/19 11:05 11/29/19 09:26 02/18/20 16:45 03/10/20 11:07 07/06/20 09:41 08/23/20 12:57 10/04/20 14:53 12/07/20 08:48 12/10/21 08:49  Eosinophils Absolute 0.0 - 0.7 K/uL 0.1 0.2 0.2 0.0 0.2 0.1 0.3 0.1 0.2 0.2 0.0 0.0 0.0 0.0 0.2 0.0 53 157 0.0 0.0 0.2 0.2      has a past medical history of Chronic back pain, Chronic rhinitis, Compressed spine fracture (HCC), COPD (chronic obstructive pulmonary disease) ([place]), Dyspnea, Emphysema lung ([place]), On home oxygen  therapy, Onychomycosis, Orthostatic hypotension, PAD (peripheral artery disease) ([place]), Pneumonia, Pulmonary embolism ([place]) (04/07/2018), Skin cancer, Small cell lung cancer, right ([place]) (2023), and Vertigo.   reports that he quit smoking about 4 years ago. His smoking use included pipe and cigarettes. He started smoking about 56 years ago. He has a 104.00 pack-year smoking history. He has never used smokeless tobacco.  Past Surgical History:  Procedure Laterality Date   ABDOMINAL AORTOGRAM W/LOWER EXTREMITY N/A 01/21/2020   Procedure: ABDOMINAL AORTOGRAM W/LOWER EXTREMITY;  Surgeon: Elam Dutch, MD;  Location: [facility];  Service: Cardiovascular;  Laterality: N/A;   APPENDECTOMY     BIOPSY  02/20/2020   Procedure: BIOPSY;  Surgeon: Rush Landmark Telford Nab., MD;  Location: [facility] [place];  Service: Gastroenterology;;   BRONCHIAL BIOPSY  09/04/2021   Procedure: BRONCHIAL BIOPSIES;  Surgeon: Garner Nash, DO;  Location: [facility];  Service: Pulmonary;;   BRONCHIAL BRUSHINGS  09/04/2021   Procedure: BRONCHIAL BRUSHINGS;  Surgeon: Garner Nash, DO;  Location: [facility];  Service: Pulmonary;;   BRONCHIAL NEEDLE ASPIRATION BIOPSY  09/04/2021   Procedure: BRONCHIAL NEEDLE ASPIRATION BIOPSIES;  Surgeon: Garner Nash, DO;  Location: [facility];  Service: Pulmonary;;   CATARACT EXTRACTION, BILATERAL     ENDARTERECTOMY FEMORAL Right 04/19/2019   Procedure: ENDARTERECTOMY RIGHT COMMON FEMORAL;  Surgeon: Elam Dutch, MD;  Location: [facility];  Service: Vascular;  Laterality: Right;   ENDARTERECTOMY FEMORAL Left 01/24/2020   Procedure: LEFT FEMORAL ENDARTERECTOMY WITH DACRON PATCH ANGIOPLASTY;  Surgeon: Elam Dutch, MD;  Location[facility];  Service: Vascular;  Laterality: Left;   ESOPHAGOGASTRODUODENOSCOPY (EGD) WITH PROPOFOL N/A 02/20/2020   Procedure: ESOPHAGOGASTRODUODENOSCOPY (EGD) WITH PROPOFOL;  Surgeon: Irving Copas., MD;  Location: [facility];  Service:  Gastroenterology;  Laterality: N/A;   FEMORAL ENDARTERECTOMY Left 01/24/2020   FIDUCIAL MARKER PLACEMENT  09/04/2021   Procedure: FIDUCIAL MARKER PLACEMENT;  Surgeon:  Garner Nash, DO;  Location: [facility];  Service: Pulmonary;;   HEMOSTASIS CLIP PLACEMENT  02/20/2020   Procedure: HEMOSTASIS CLIP PLACEMENT;  Surgeon: Irving Copas., MD;  Location: [facility];  Service: Gastroenterology;;   HOT HEMOSTASIS N/A 02/20/2020   Procedure: HOT HEMOSTASIS (ARGON PLASMA COAGULATION/BICAP);  Surgeon: Irving Copas., MD;  Location: [facility];  Service: Gastroenterology;  Laterality: N/A;   INSERTION OF ILIAC STENT Left 01/24/2020   Procedure: INSERTION OF VBX STENT 8X59 AND 8X39 LEFT COMMON ILIAC ARTERY. INSERTION OF INNOVA 7 X 60 INNOVA STENT LEFT EXTERNAL ILIAC ARTERY. ;  Surgeon: Elam Dutch, MD;  Location: [facility];  Service: Vascular;  Laterality: Left;   LOWER EXTREMITY ANGIOGRAPHY  12/11/2018   LOWER EXTREMITY ANGIOGRAPHY N/A 12/11/2018   Procedure: LOWER EXTREMITY ANGIOGRAPHY;  Surgeon: Elam Dutch, MD;  Location: [facility];  Service: Cardiovascular;  Laterality: N/A;   PATCH ANGIOPLASTY Right 04/19/2019   Procedure: Patch Angioplasty;  Surgeon: Elam Dutch, MD;  Location: [facility];  Service: Vascular;  Laterality: Right;   PERIPHERAL VASCULAR INTERVENTION Right 12/11/2018   Procedure: PERIPHERAL VASCULAR INTERVENTION;  Surgeon: Elam Dutch, MD;  Location: [facility];  Service: Cardiovascular;  Laterality: Right;  Common Iliac    SKIN CANCER EXCISION     "lips, face, ears, arms" (04/07/2018)   TONSILLECTOMY     ULTRASOUND GUIDANCE FOR VASCULAR ACCESS Right 01/24/2020   Procedure: ULTRASOUND GUIDANCE FOR VASCULAR ACCESS;  Surgeon: Elam Dutch, MD;  Location: [facility];  Service: Vascular;  Laterality: Right;   VIDEO BRONCHOSCOPY WITH RADIAL ENDOBRONCHIAL ULTRASOUND  09/04/2021   Procedure: VIDEO BRONCHOSCOPY WITH RADIAL ENDOBRONCHIAL  ULTRASOUND;  Surgeon: Garner Nash, DO;  Location: [facility];  Service: Pulmonary;;    Allergies  Allergen Reactions   Tape Other (See Comments)    SKIN IS VERY THIN, TEARS SKIN; CAN ONLY USE COBAN WRAPS DUE TO CONDITION OF SKIN!!   Amoxicillin-Pot Clavulanate Other (See Comments)    Thrush/ Sore throat/Voice became hoarse   Ciprofloxacin Nausea Only    Sick on stomach, weak/tired   Levaquin [Levofloxacin] Other (See Comments)    hallucinations   Lipitor [Atorvastatin] Rash    Immunization History  Administered Date(s) Administered   Fluad Quad(high Dose 65+) 03/16/2019, 03/10/2020   Influenza Split 04/24/2012   Influenza Whole 05/08/2009, 04/08/2011   Influenza, High Dose Seasonal PF 05/01/2017, 03/31/2018, 04/17/2021   Influenza,inj,Quad PF,6+ Mos 04/27/2013, 04/15/2014, 04/24/2015   Influenza,inj,quad, With Preservative 05/01/2018   Influenza-Unspecified 05/13/2016   PFIZER Comirnaty(Gray Top)Covid-19 Tri-Sucrose Vaccine 11/24/2020   PFIZER(Purple Top)SARS-COV-2 Vaccination 07/30/2019, 08/20/2019, 04/07/2020   Pfizer Covid-19 Vaccine Bivalent Booster 61yrs & up 03/16/2021   Pneumococcal Conjugate-13 02/25/2014   Pneumococcal Polysaccharide-23 08/15/2011, 12/19/2016   Pneumococcal-Unspecified 03/08/2017   Td 02/21/2010   Tdap 11/05/2017   Zoster, Live 01/29/2012    Family History  Problem Relation Age of Onset   Heart disease Mother        CABG in her 17s, nonsmoker   Cancer Mother    Stroke Father    Heart disease Father        Died of MI at age 41, smoker   Hepatitis Sister    Coronary artery disease Other        male 1st degree relative <60   Colon cancer Neg Hx    Pancreatic cancer Neg Hx    Esophageal cancer Neg Hx    Inflammatory bowel disease Neg Hx  Liver disease Neg Hx    Rectal cancer Neg Hx    Stomach cancer Neg Hx      Current Outpatient Medications:    acetaminophen (TYLENOL) 500 MG tablet, Take 1,000 mg by mouth every 6 (six) hours  as needed for moderate pain. , Disp: , Rfl:    albuterol (VENTOLIN HFA) 108 (90 Base) MCG/ACT inhaler, USE 2 INHALATIONS BY MOUTH  EVERY 6 HOURS AS NEEDED FOR WHEEZING, Disp: 72 g, Rfl: 3   azithromycin (ZITHROMAX) 250 MG tablet, Take 250 mg by mouth 3 (three) times a week., Disp: , Rfl:    Calcium Carbonate-Vitamin D (CALCIUM 600+D PO), Take 2 tablets by mouth daily., Disp: , Rfl:    ELIQUIS 2.5 MG TABS tablet, TAKE 1 TABLET BY MOUTH  TWICE DAILY, Disp: 180 tablet, Rfl: 3   Ferrous Sulfate (IRON) 325 (65 Fe) MG TABS, Take 325 mg by mouth every other day., Disp: , Rfl:    fluticasone (FLONASE) 50 MCG/ACT nasal spray, Place 2 sprays into both nostrils daily., Disp: 48 g, Rfl: 3   folic acid (FOLVITE) 1 MG tablet, Take 1 tablet (1 mg total) by mouth daily., Disp: , Rfl:    furosemide (LASIX) 20 MG tablet, TAKE 1 TABLET BY MOUTH  DAILY AS NEEDED FOR FLUID  OR EDEMA, Disp: 90 tablet, Rfl: 3   hydrOXYzine (VISTARIL) 25 MG capsule, Take 1 capsule (25 mg total) by mouth every 8 (eight) hours as needed., Disp: 30 capsule, Rfl: 0   magic mouthwash (nystatin, lidocaine, diphenhydrAMINE, alum & mag hydroxide) suspension, Swish and spit 5 mLs 4 (four) times daily., Disp: 180 mL, Rfl: 1   magic mouthwash SOLN, Take 5 mLs by mouth 3 (three) times daily as needed for mouth pain., Disp: 150 mL, Rfl: 0   Multiple Vitamin (MULTIVITAMIN WITH MINERALS) TABS tablet, Take 1 tablet by mouth daily., Disp: , Rfl:    mupirocin cream (BACTROBAN) 2 %, Apply 1 application topically 2 (two) times daily. (Patient taking differently: Apply 1 application  topically daily as needed (Cyst).), Disp: 22 g, Rfl: 0   oxyCODONE (OXY IR/ROXICODONE) 5 MG immediate release tablet, Take 1 tablet (5 mg total) by mouth every 4 (four) hours as needed for severe pain., Disp: 60 tablet, Rfl: 0   OXYGEN, Inhale 4-5 L into the lungs continuous. , Disp: , Rfl:    pantoprazole (PROTONIX) 20 MG tablet, TAKE 1 TABLET BY MOUTH  DAILY, Disp: 90 tablet,  Rfl: 3   Polyethyl Glycol-Propyl Glycol (SYSTANE) 0.4-0.3 % SOLN, Place 1 drop into both eyes every other day., Disp: , Rfl:    Respiratory Therapy Supplies (FLUTTER) DEVI, 10 times Twice a day and prn as needed, may increase if feeling worse, Disp: 1 each, Rfl: 0   roflumilast (DALIRESP) 500 MCG TABS tablet, TAKE 1 TABLET BY MOUTH DAILY, Disp: 90 tablet, Rfl: 3   rosuvastatin (CRESTOR) 10 MG tablet, TAKE 1 TABLET BY MOUTH  DAILY, Disp: 90 tablet, Rfl: 3   SPIRIVA RESPIMAT 2.5 MCG/ACT AERS, USE 2 INHALATIONS BY MOUTH  DAILY, Disp: 12 g, Rfl: 3   SYMBICORT 160-4.5 MCG/ACT inhaler, USE 2 INHALATIONS BY MOUTH  TWICE DAILY, Disp: 30.6 g, Rfl: 3   triamcinolone cream (KENALOG) 0.1 %, Apply 1 application. topically 2 (two) times daily. For 7-10 days maximum, Disp: 80 g, Rfl: 0      Objective:   Vitals:   03/07/22 1138  BP: 126/80  Pulse: (!) 112  SpO2: 91%  Weight: 179 lb 3.2  oz (81.3 kg)  Height: 6' (1.829 m)    Estimated body mass index is 24.3 kg/m as calculated from the following:   Height as of this encounter: 6' (1.829 m).   Weight as of this encounter: 179 lb 3.2 oz (81.3 kg).  $Rem'@WEIGHTCHANGE'VYoN$ @  [employer]   03/07/22 1138  Weight: 179 lb 3.2 oz (81.3 kg)     Physical Exam    General: No distress.  Chronically unwell looking cushingoid facies male sitting on wheelchair with oxygen on Neuro: Alert and Oriented x 3. GCS 15. Speech normal Psych: Pleasant Resp:  Barrel Chest - yes.  Wheeze - no but coarse, Crackles - no, No overt respiratory distress CVS: Normal heart sounds. Murmurs - no Ext: Stigmata of Connective Tissue Disease - no.  Has chronic venous stasis edema HEENT: Normal upper airway. PEERL +. No post nasal drip        Assessment:       ICD-10-CM   1. COPD, frequent exacerbations ([place])  J44.1 Pulmonary function test    2. Stage 4 very severe COPD by GOLD classification ([place])  J44.9 Pulmonary function test    3. Chronic respiratory failure with hypoxia  (HCC)  J96.11 Pulmonary function test         Plan:     Patient Instructions  Nodule of lower lobe of right lung - 27mm new Jul 10, 2021. 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  Plan - per DR Julien Nordmann and Lisbeth Renshaw - CT scan timing  COPD with acute exacerbation ([place]) COPD, frequent exacerbations ([place])  - no flare up but always at elevated at risk in future Plan - check ABG -(last checked 2021) - if CO2 up then bipap night will be bneficial - check CBC with diff and blood IgE again via PCP [name], MD  - if eos high - can consider dupixemnt - I have sent him message - continue  daily prednisone $RemoveBeforeDE'10mg'PWKPvjzUlNPjhdI$ , azithromycin MWF, and roflumilast daily per routine   Stage 4 very severe COPD by GOLD classification ([place]) Chronic respiratory failure with hypoxia ([place])  Plan  --Continue oxygen, Spiriva, Symbicort, Daliresp and 3 times weekly azithromycin and daily prednisone otherwise   History Pulmonary embolism -   -normal d-dimer Jan 2023  Plan  - - continue low dose eliquis for now esp with cancer and leg edema    Follow-up -3 months with dR Astin Sayre    SIGNATURE    Dr. Brand Males, M.D., F.C.C.P,  Pulmonary and Critical Care Medicine Staff Physician, [place] Director - Interstitial Lung Disease  Program  Pulmonary [place] [facility] [place], [place], [address]  Pager: [phone], If no answer or between  15:00h - 7:00h: call [phone] Telephone: [phone]  12:11 PM 03/07/2022

## 2022-03-07 ENCOUNTER — Ambulatory Visit (INDEPENDENT_AMBULATORY_CARE_PROVIDER_SITE_OTHER): Payer: Medicare Other | Admitting: Internal Medicine

## 2022-03-07 ENCOUNTER — Encounter: Payer: Self-pay | Admitting: Internal Medicine

## 2022-03-07 VITALS — BP 126/80 | HR 112 | Ht 72.0 in | Wt 179.2 lb

## 2022-03-07 DIAGNOSIS — J9611 Chronic respiratory failure with hypoxia: Secondary | ICD-10-CM | POA: Diagnosis not present

## 2022-03-07 DIAGNOSIS — J441 Chronic obstructive pulmonary disease with (acute) exacerbation: Secondary | ICD-10-CM | POA: Diagnosis not present

## 2022-03-07 DIAGNOSIS — J449 Chronic obstructive pulmonary disease, unspecified: Secondary | ICD-10-CM | POA: Diagnosis not present

## 2022-03-07 NOTE — Addendum Note (Signed)
Addended by: Clyde [place]borg A on: 03/07/2022 01:46 PM   Modules accepted: Orders

## 2022-03-07 NOTE — Telephone Encounter (Signed)
Future lab ordered and noted on lab appointment.

## 2022-03-07 NOTE — Telephone Encounter (Signed)
Team I got this message from [name] "Hi Dr Yong Channel, he is having lab draw with you 03/14/22. If would be kind enough to get CBC with diff -if eos high, then he will qualify for dupixent to prevent AECOPD"  Please add this on under hypertension- and make sure lab knows to do this and the prior vitamin D ordered on 12/12/21

## 2022-03-14 ENCOUNTER — Other Ambulatory Visit (INDEPENDENT_AMBULATORY_CARE_PROVIDER_SITE_OTHER): Payer: Medicare Other

## 2022-03-14 DIAGNOSIS — I10 Essential (primary) hypertension: Secondary | ICD-10-CM

## 2022-03-14 DIAGNOSIS — J449 Chronic obstructive pulmonary disease, unspecified: Secondary | ICD-10-CM

## 2022-03-14 LAB — CBC WITH DIFFERENTIAL/PLATELET
Basophils Absolute: 0 10*3/uL (ref 0.0–0.1)
Basophils Relative: 0.3 % (ref 0.0–3.0)
Eosinophils Absolute: 0.1 10*3/uL (ref 0.0–0.7)
Eosinophils Relative: 0.9 % (ref 0.0–5.0)
HCT: 35.6 % — ABNORMAL LOW (ref 39.0–52.0)
Hemoglobin: 11.9 g/dL — ABNORMAL LOW (ref 13.0–17.0)
Lymphocytes Relative: 6.6 % — ABNORMAL LOW (ref 12.0–46.0)
Lymphs Abs: 0.8 10*3/uL (ref 0.7–4.0)
MCHC: 33.5 g/dL (ref 30.0–36.0)
MCV: 97.8 fl (ref 78.0–100.0)
Monocytes Absolute: 1 10*3/uL (ref 0.1–1.0)
Monocytes Relative: 9.2 % (ref 3.0–12.0)
Neutro Abs: 9.5 10*3/uL — ABNORMAL HIGH (ref 1.4–7.7)
Neutrophils Relative %: 83 % — ABNORMAL HIGH (ref 43.0–77.0)
Platelets: 219 10*3/uL (ref 150.0–400.0)
RBC: 3.64 Mil/uL — ABNORMAL LOW (ref 4.22–5.81)
RDW: 14.3 % (ref 11.5–15.5)
WBC: 11.4 10*3/uL — ABNORMAL HIGH (ref 4.0–10.5)

## 2022-03-14 NOTE — Addendum Note (Signed)
Addended by: [name] on: 03/14/2022 09:58 AM   Modules accepted: Orders

## 2022-03-15 ENCOUNTER — Other Ambulatory Visit: Payer: Self-pay | Admitting: Family Medicine

## 2022-03-15 LAB — IGE: IgE (Immunoglobulin E), Serum: 5 kU/L (ref <=114)

## 2022-03-18 ENCOUNTER — Encounter: Payer: Self-pay | Admitting: Internal Medicine

## 2022-03-19 MED ORDER — PREDNISONE 10 MG PO TABS: 10.0000 mg | ORAL_TABLET | Freq: Every day | ORAL | 3 refills | Status: DC

## 2022-03-26 ENCOUNTER — Telehealth: Payer: Self-pay

## 2022-03-26 ENCOUNTER — Other Ambulatory Visit (HOSPITAL_COMMUNITY)
Admission: RE | Admit: 2022-03-26 | Discharge: 2022-03-26 | Disposition: A | Discharge status: Home / Self Care | Payer: Medicare Other | Source: Ambulatory Visit | Attending: Internal Medicine | Admitting: Internal Medicine

## 2022-03-26 ENCOUNTER — Telehealth: Payer: Self-pay | Admitting: Internal Medicine

## 2022-03-26 ENCOUNTER — Other Ambulatory Visit: Payer: Self-pay

## 2022-03-26 DIAGNOSIS — J449 Chronic obstructive pulmonary disease, unspecified: Secondary | ICD-10-CM

## 2022-03-26 NOTE — Telephone Encounter (Signed)
Called and spoke with patient. He stated that he was not able to get his ABG done at the hospital today. He had an appt at 952am today and the staff was unable to find the order in the system. I verified that the order was placed on 03/07/22. I offered to ask the PCCs if they could get him rescheduled but the patient refused. He stated that he knows that our office ordered it correctly but he had a bad experience at the hospital with the lab staff.   Will route to MR as a FYI.

## 2022-03-26 NOTE — Telephone Encounter (Signed)
LMOVM to schedule AWV

## 2022-03-26 NOTE — Telephone Encounter (Signed)
Patient's wife is calling for the [facility] on [address] because she said her husband is supposed to have an order for blood gas.  She stated that they do not see the order and wife stated that [name] sent in the order.  Please call wife to confirm that this order is in.  CB# [id number]

## 2022-03-27 NOTE — Telephone Encounter (Signed)
Ok will manage with out it

## 2022-04-01 ENCOUNTER — Encounter: Payer: Self-pay | Admitting: *Deleted

## 2022-05-02 ENCOUNTER — Ambulatory Visit (INDEPENDENT_AMBULATORY_CARE_PROVIDER_SITE_OTHER): Payer: Medicare Other | Admitting: Family Medicine

## 2022-05-02 ENCOUNTER — Other Ambulatory Visit: Payer: Self-pay | Admitting: Radiation Therapy

## 2022-05-02 ENCOUNTER — Encounter: Payer: Self-pay | Admitting: Family Medicine

## 2022-05-02 VITALS — BP 110/72 | HR 104 | Temp 98.3°F | Ht 72.0 in | Wt 178.0 lb

## 2022-05-02 DIAGNOSIS — J441 Chronic obstructive pulmonary disease with (acute) exacerbation: Secondary | ICD-10-CM

## 2022-05-02 DIAGNOSIS — S41112A Laceration without foreign body of left upper arm, initial encounter: Secondary | ICD-10-CM | POA: Diagnosis not present

## 2022-05-02 DIAGNOSIS — S41112S Laceration without foreign body of left upper arm, sequela: Secondary | ICD-10-CM

## 2022-05-02 DIAGNOSIS — C3490 Malignant neoplasm of unspecified part of unspecified bronchus or lung: Secondary | ICD-10-CM

## 2022-05-02 MED ORDER — PREDNISONE 20 MG PO TABS: ORAL_TABLET | ORAL | 0 refills | Status: DC

## 2022-05-02 MED ORDER — OXYCODONE HCL 5 MG PO CAPS: 5.0000 mg | ORAL_CAPSULE | Freq: Four times a day (QID) | ORAL | 0 refills | Status: DC | PRN

## 2022-05-02 MED ORDER — DOXYCYCLINE HYCLATE 100 MG PO TABS: 100.0000 mg | ORAL_TABLET | Freq: Two times a day (BID) | ORAL | 0 refills | Status: AC

## 2022-05-02 NOTE — Patient Instructions (Addendum)
Doxycycline to cover COPD flare up. Also cover with prednisone burst  Please only shower with assistance from now on!   Hoping doxycyline also prevents cellulitis or deeper infection int he arm  Pain medicine short term to hep particularly with dressing changes  Recommended follow up: Return for as needed for new, worsening, persistent symptoms.

## 2022-05-02 NOTE — Progress Notes (Signed)
Phone [phone] In person visit   Subjective:   Francisco [name] is a 76 y.o. year old very pleasant male patient who presents for/with See problem oriented charting Chief Complaint  Patient presents with   Fall    Pt c/o scraping arm during fall on 05/01/22, pt states he got dizzy trying to get in to the shower yesterday. He is sore today but not dizzy.   Past Medical History-  Patient Active Problem List   Diagnosis Date Noted   Malignant neoplasm of lower lobe of right lung ([place])     Priority: High   Acute GI bleeding 02/18/2020    Priority: High   PAD (peripheral artery disease) ([place]) 12/11/2018    Priority: High   Stage 4 very severe COPD by GOLD classification ([place]) 11/27/2018    Priority: High   DNR (do not resuscitate) 04/21/2018    Priority: High   Chronic pulmonary embolism ([place]) 04/07/2018    Priority: High   Osteoporosis 11/19/2017    Priority: High   Thoracic compression fracture ([place]) 11/02/2017    Priority: High   Former smoker 08/23/2008    Priority: High   Iron deficiency anemia due to chronic blood loss 08/23/2020    Priority: Medium    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    Priority: Medium    History of upper GI bleeding 04/30/2020    Priority: Medium    Essential tremor 03/31/2018    Priority: Medium    BPPV (benign paroxysmal positional vertigo), right 02/10/2018    Priority: Medium    Essential hypertension 11/02/2017    Priority: Medium    BPH associated with nocturia 06/25/2017    Priority: Medium    Hyperglycemia 06/12/2016    Priority: Medium    Hyperlipidemia 07/22/2014    Priority: Medium    Pre-operative respiratory examination 12/10/2018    Priority: Low   Venous stasis dermatitis of both lower extremities 11/02/2017    Priority: Low   Chronic respiratory failure with hypoxia ([place]) 09/23/2017    Priority: Low   Leukocytosis 12/19/2016    Priority: Low   Onychomycosis 07/22/2014    Priority: Low   Left ankle  swelling 07/22/2014    Priority: Low   History of skin cancer 05/17/2014    Priority: Low   Chronic rhinitis 10/28/2012    Priority: Low   Pulmonary nodule 09/23/2011    Priority: Low   History of colonic polyps 08/23/2008    Priority: Low   Chronic systolic congestive heart failure ([place]) 12/10/2021   AVM (arteriovenous malformation) of small bowel, acquired 11/21/2020   Hiatal hernia 04/30/2020   Chronic anticoagulation 04/30/2020   Anemia 41/74/0814   Diastolic dysfunction 48/18/5631    Medications- reviewed and updated Current Outpatient Medications  Medication Sig Dispense Refill   acetaminophen (TYLENOL) 500 MG tablet Take 1,000 mg by mouth every 6 (six) hours as needed for moderate pain.      albuterol (VENTOLIN HFA) 108 (90 Base) MCG/ACT inhaler USE 2 INHALATIONS BY MOUTH  EVERY 6 HOURS AS NEEDED FOR WHEEZING 72 g 3   azithromycin (ZITHROMAX) 250 MG tablet Take 250 mg by mouth 3 (three) times a week.     Calcium Carbonate-Vitamin D (CALCIUM 600+D PO) Take 2 tablets by mouth daily.     doxycycline (VIBRA-TABS) 100 MG tablet Take 1 tablet (100 mg total) by mouth 2 (two) times daily for 7 days. 14 tablet 0   ELIQUIS 2.5 MG TABS tablet TAKE 1 TABLET  BY MOUTH  TWICE DAILY 180 tablet 3   Ferrous Sulfate (IRON) 325 (65 Fe) MG TABS Take 325 mg by mouth every other day.     fluticasone (FLONASE) 50 MCG/ACT nasal spray USE 2 SPRAYS IN BOTH  NOSTRILS DAILY 48 g 3   folic acid (FOLVITE) 1 MG tablet Take 1 tablet (1 mg total) by mouth daily.     furosemide (LASIX) 20 MG tablet TAKE 1 TABLET BY MOUTH  DAILY AS NEEDED FOR FLUID  OR EDEMA 90 tablet 3   hydrOXYzine (VISTARIL) 25 MG capsule Take 1 capsule (25 mg total) by mouth every 8 (eight) hours as needed. 30 capsule 0   magic mouthwash (nystatin, lidocaine, diphenhydrAMINE, alum & mag hydroxide) suspension Swish and spit 5 mLs 4 (four) times daily. 180 mL 1   magic mouthwash SOLN Take 5 mLs by mouth 3 (three) times daily as needed for  mouth pain. 150 mL 0   Multiple Vitamin (MULTIVITAMIN WITH MINERALS) TABS tablet Take 1 tablet by mouth daily.     mupirocin cream (BACTROBAN) 2 % Apply 1 application topically 2 (two) times daily. (Patient taking differently: Apply 1 application  topically daily as needed (Cyst).) 22 g 0   oxyCODONE (OXY IR/ROXICODONE) 5 MG immediate release tablet Take 1 tablet (5 mg total) by mouth every 4 (four) hours as needed for severe pain. 60 tablet 0   oxycodone (OXY-IR) 5 MG capsule Take 1 capsule (5 mg total) by mouth every 6 (six) hours as needed (arm pain after fall- particularly with dressing changes due to sticking issue). 20 capsule 0   OXYGEN Inhale 4-5 L into the lungs continuous.      pantoprazole (PROTONIX) 20 MG tablet TAKE 1 TABLET BY MOUTH  DAILY 90 tablet 3   Polyethyl Glycol-Propyl Glycol (SYSTANE) 0.4-0.3 % SOLN Place 1 drop into both eyes every other day.     predniSONE (DELTASONE) 10 MG tablet Take 1 tablet (10 mg total) by mouth daily with breakfast. 90 tablet 3   predniSONE (DELTASONE) 20 MG tablet Take 2 pills for 3 days, 1 pill for 4 days 10 tablet 0   Respiratory Therapy Supplies (FLUTTER) DEVI 10 times Twice a day and prn as needed, may increase if feeling worse 1 each 0   roflumilast (DALIRESP) 500 MCG TABS tablet TAKE 1 TABLET BY MOUTH DAILY 90 tablet 3   rosuvastatin (CRESTOR) 10 MG tablet TAKE 1 TABLET BY MOUTH  DAILY 90 tablet 3   SPIRIVA RESPIMAT 2.5 MCG/ACT AERS USE 2 INHALATIONS BY MOUTH  DAILY 12 g 3   SYMBICORT 160-4.5 MCG/ACT inhaler USE 2 INHALATIONS BY MOUTH  TWICE DAILY 30.6 g 3   triamcinolone cream (KENALOG) 0.1 % Apply 1 application. topically 2 (two) times daily. For 7-10 days maximum 80 g 0   No current facility-administered medications for this visit.     Objective:  BP 110/72   Pulse (!) 118   Temp 98.3 F (36.8 C)   Ht 6' (1.829 m)   Wt 178 lb (80.7 kg)   SpO2 95%   BMI 24.14 kg/m  Gen: NAD, resting comfortably CV: Mildly tachycardic Lungs:  Diffuse wheezes noted-no obvious crackles Skin: warm, dry 5 x 5 cm C-shaped skin tears-on the left arm-1 above the elbow into below the elbow-unable to approximate skin flaps-underlying tissue appears healthy, bruising throughout the left arm    Assessment and Plan   # Fall with arm injury S: Had a fall on 05/01/2022-patient got dizzy trying  to get into the shower by himself-generally knows to allow assist from his wife but he was trying to allow her to rest (agrees to not do this again).  Dizziness has since resolved.  Did not hit his head.  Scraped his left arm- hit either on vent or tile floor- called EMS to get him up  They bandaged this and present for evaluation. Didn't take too long to stop bleeding.  -after fall having some pain at wound site - has used last few oxycodone he had A/P: Patient with 3 significant skin tears at least 5 x 5 cm in C-shape (to below elbow and arm above elbow).  We are unable to repair these due to fragility of skin.  Unable to Steri-Strip skin together or approximate skin to consider Dermabond. -The best we can do is try keep the area moist with Xeroform-3 separate dressings each well-lubricated with bacitracin were applied and then covered with nonstick and then ABD pad with wrap below elbow and then a separate one above the elbow.  At minimum daily dressing changes but consider starting with every 12 hours dressing changes to make sure area does not dry out/stick as that has been his biggest setbacks with similar issues on the right arm .  -tdap 2019-we will not update at this time - Family very concerned about cellulitis development.  See COPD section but I believe the doxycycline will provide some coverage.  Has had confusion on Keflex in the past -He is on Eliquis but bleeding stopped with dressing and only restarts if wound pulls when dressing change-we will continue Eliquis for now -Dressing changes in particular very painful-we will provide oxycodone to help  with these changes-discussed liberating more aggressively and essentially letting dressing fall off if possible -Denies deeper bony pain-pain very superficial at the wound but no fever or chills  #COPD exacerbation S: In last few days patient was noted increased sputum production, cough, mild shortness of breath, wheezing despite maintaining on Daliresp and Spiriva and Symbicort A/P: Severe COPD with early exacerbation-we will go ahead and treat with prednisone increased from 10 mg baseline-7-day course-and doxycycline.  Reports rash in the past but has tolerated more recently-they will reach out if recurs   Recommended follow up: Return for as needed for new, worsening, persistent symptoms. Future Appointments  Date Time Provider [place]  06/06/2022  9:45 AM Brand Males, MD [facility] None  06/21/2022 11:15 AM [facility] None  06/24/2022  1:30 PM Curt Bears, MD [facility] None    Lab/Order associations:   ICD-10-CM   1. COPD exacerbation ([place])  J44.1     2. Skin tear of left upper arm without complication, sequela  A83.419Q       Meds ordered this encounter  Medications   doxycycline (VIBRA-TABS) 100 MG tablet    Sig: Take 1 tablet (100 mg total) by mouth 2 (two) times daily for 7 days.    Dispense:  14 tablet    Refill:  0   oxycodone (OXY-IR) 5 MG capsule    Sig: Take 1 capsule (5 mg total) by mouth every 6 (six) hours as needed (arm pain after fall- particularly with dressing changes due to sticking issue).    Dispense:  20 capsule    Refill:  0   predniSONE (DELTASONE) 20 MG tablet    Sig: Take 2 pills for 3 days, 1 pill for 4 days    Dispense:  10 tablet    Refill:  0  Return precautions advised.  Garret Reddish, MD

## 2022-05-03 ENCOUNTER — Other Ambulatory Visit: Payer: Self-pay | Admitting: Internal Medicine

## 2022-05-15 ENCOUNTER — Telehealth: Payer: Self-pay | Admitting: Radiation Therapy

## 2022-05-15 ENCOUNTER — Other Ambulatory Visit: Payer: Self-pay | Admitting: Family Medicine

## 2022-05-15 NOTE — Telephone Encounter (Signed)
Called patient about his upcoming brain MRI and telephone visit with Bryson Ha in January. He asked me to send an email with this information instead. Below is the email sent using the address on file in [facility].

## 2022-06-06 ENCOUNTER — Encounter: Payer: Self-pay | Admitting: Internal Medicine

## 2022-06-06 ENCOUNTER — Ambulatory Visit (INDEPENDENT_AMBULATORY_CARE_PROVIDER_SITE_OTHER): Payer: Medicare Other | Admitting: Internal Medicine

## 2022-06-06 VITALS — BP 112/68 | HR 107 | Temp 97.8°F | Ht 72.0 in | Wt 182.0 lb

## 2022-06-06 DIAGNOSIS — J441 Chronic obstructive pulmonary disease with (acute) exacerbation: Secondary | ICD-10-CM

## 2022-06-06 DIAGNOSIS — J449 Chronic obstructive pulmonary disease, unspecified: Secondary | ICD-10-CM

## 2022-06-06 LAB — CBC WITH DIFFERENTIAL/PLATELET
Basophils Absolute: 0 10*3/uL (ref 0.0–0.1)
Basophils Relative: 0.4 % (ref 0.0–3.0)
Eosinophils Absolute: 0.1 10*3/uL (ref 0.0–0.7)
Eosinophils Relative: 0.9 % (ref 0.0–5.0)
HCT: 36.4 % — ABNORMAL LOW (ref 39.0–52.0)
Hemoglobin: 12.2 g/dL — ABNORMAL LOW (ref 13.0–17.0)
Lymphocytes Relative: 4.3 % — ABNORMAL LOW (ref 12.0–46.0)
Lymphs Abs: 0.5 10*3/uL — ABNORMAL LOW (ref 0.7–4.0)
MCHC: 33.5 g/dL (ref 30.0–36.0)
MCV: 96.3 fl (ref 78.0–100.0)
Monocytes Absolute: 0.6 10*3/uL (ref 0.1–1.0)
Monocytes Relative: 5.3 % (ref 3.0–12.0)
Neutro Abs: 9.8 10*3/uL — ABNORMAL HIGH (ref 1.4–7.7)
Neutrophils Relative %: 89.1 % — ABNORMAL HIGH (ref 43.0–77.0)
Platelets: 251 10*3/uL (ref 150.0–400.0)
RBC: 3.78 Mil/uL — ABNORMAL LOW (ref 4.22–5.81)
RDW: 13.1 % (ref 11.5–15.5)
WBC: 11 10*3/uL — ABNORMAL HIGH (ref 4.0–10.5)

## 2022-06-06 MED ORDER — DOXYCYCLINE HYCLATE 100 MG PO TABS: 100.0000 mg | ORAL_TABLET | Freq: Two times a day (BID) | ORAL | 0 refills | Status: DC

## 2022-06-06 MED ORDER — PREDNISONE 10 MG PO TABS: ORAL_TABLET | ORAL | 0 refills | Status: AC

## 2022-06-06 NOTE — Patient Instructions (Addendum)
Nodule of lower lobe of right lung - 47mm new Jul 10, 2021. 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  CT June 2023 with response  Plan - per DR Julien Nordmann and Lisbeth Renshaw - CT scan timing  COPD with acute exacerbation [facility]) COPD, frequent exacerbations ([place])  - early flare up 06/06/2022  Plan -  check CBC with diff 06/06/2022  - if eos high - can consider dupixemnt - discussed VEnt2000 HILL-ROM open bipap -   - take article  - you will reflect on this - Take doxycycline 100mg  po twice daily x 5 days; take after meals and avoid sunlight  - hold azithromycin during this time - Take prednisone 40 mg daily x 2 days, then 30mg  daily x 2 days, then 20mg  daily x 2 days, then 10mg  daily baseline to cntiue - Otherwise  continue  daily prednisone 10mg , azithromycin MWF, and roflumilast daily per routine   Stage 4 very severe COPD by GOLD classification ([place]) Chronic respiratory failure with hypoxia ([place])  Plan  --Continue oxygen, Spiriva, Symbicort, Daliresp and 3 times weekly azithromycin and daily prednisone otherwise (after flare up Rx)  History Pulmonary embolism -   -normal d-dimer Jan 2023  Plan  - - continue low dose eliquis for now esp with cancer and leg edema  Vaccine counseling  Plan - RSV vaccine 06/06/2022 at [facility]: ok to get 06/06/2022    Follow-up -3 months with dR [name]

## 2022-06-06 NOTE — Progress Notes (Signed)
ROV 12/30/16 -- patient has a history of tobacco use, COPD currently maintained on chronic prednisone, Symbicort, Spiriva. Most recent blurry function testing was March 2011 with an FEV1 of 1.2 L (34% Pred), severe obstruction. He up titrates his prednisone on his own based on symptoms. No abx since last time. He has increased pred temporrily about 4 -5 x since last time. No increases for over a month. He is smoking a pipe, not cigarettes. He is using HCTZ more frequently these days. He is on flonase and singulair. He does note that he has some slow progression of his DOE. He coughs a few times a day, clears clear sputum.   rov 07/17/17 --this is a follow-up visit for patient with active tobacco use, severe COPD and chronic prednisone use.  He often titrates his prednisone on his own depending on how he is feeling. He complains of a new pain in his back below his left shoulder blade, has been present for the last 10 days. sometimes pleuritic, can be stabbing, worse w cough. His pred use: on 65m for the last 19 days. No hx VTE. He has LE edema, venous stasis changes, increased since his diuretics decreased.   ROV 09/23/17 --76year old man with a history of tobacco use (still smokes pipe), severe COPD and severe obstruction on spirometry.  At his last visit he had some pleuritic left back pain and I performed a CT PA that I have reviewed from 07/17/17.  There was no pulmonary embolism but extensive emphysema present, some bilateral apical scar more so on the right, stable.  He uses prednisone 10 mg daily and uptitrate depending on his day-to-day symptoms.  He is otherwise managed on Symbicort and Spiriva.  Desaturated on arrival today after ambulating to the exam room. He reports more dyspnea, has increased pred intermittently.    OV 12/05/2017  Chief Complaint  Patient presents with   Follow-up    Switching from RB to MR.   Pt is on 3L with exertion and 2L at rest. Pt states his breathing had become  worse but since he has been on O2 since 3/21, it has really helped with his breathing.  Pt was in hospital 4/28-5/3. Other than SOB, pt does have c/o cough, rattling in chest. Denies any CP/chest tightness.   History from patient, his wife and review of the old chart  [name] a transfer of care from RBaltazar Apoto myself Dr. RChase Caller  His son is my patient and therefore he is done this transfer of care.  According to the patient he is to be seen by Dr. CGwenette Greetfor COPD.  Patient is FEV1 34% based on March 2011 PFTs according to chart review.  At baseline he is maintained on Spiriva, Symbicort, Singulair, chronic daily prednisone 10 mg/day for at least 3 years, and 3 times a week of azithromycin for 2 years.  Earlier this year in March 2019 he went on daytime oxygen and following a recent hospitalization April-May 2019.  For syncope that was believed due to nocturnal hypoxemia he was started on night oxygen.  Since starting oxygen his edema has improved and his overall quality of life is improved and he stopped having any orthostasis or presyncopal episodes.  He is grateful for the fact that he is on oxygen.  His current COPD CAT score and severity of symptoms is rated below and is 26.  Show significant amount of symptom burden.  His main goal is to improve his quality  of life.  He had his wife have many questions centering around quality of life, medication therapy.  Of note he has had some thoracic vertebral fractures that are new.  This happened after the fall in late April 2019.  Discovered at admission last month.  He feels is due to prednisone.  He is wondering about portable oxygen.   OV 01/29/2018  Chief Complaint  Patient presents with   Follow-up    Pt states he has been doing okay since last visit. States breathing is about the same, has an occ cough but states the O2 has helped out a lot with breathing and cough. Denies any complaints of CP.   Francisco [name] , 76 y.o. , with  dob 08-12-1945 and male ,Not Hispanic or Latino from [place] [address] - presents to lung clinic for advanced COPD follow-up. Since his last visit his symptoms course of improvement. Score is 26 and severe symptoms of document below. He is on Spiriva, Symbicort, continues oxygen, daily prednisone and azithromycin 3 times a week. He has stopped his singulair without any problems. Overall he says he is better. He is willing to give TRELEGY inhaler trial. Last visit to check blood gas and he does not have hypercapnia so he does not qualify for BiPAP        OV 04/03/2018  Subjective:  Patient ID: Francisco [name], male , DOB: [birth year] , age 72 y.o. , MRN: [id number] , ADDRESS: [place] [address]   04/03/2018 -   Chief Complaint  Patient presents with   Acute Visit    Pt's O2 sats have been dropping into the 70s with little exertion.  Pt took O2 off just to shave 9/26 and after he finished shaving put the O2 back on, walked from the bathroom down to the living room on O2 and sats were at 77% on 3L 9/26. Pt also has c/o cough with white to yellow mucus and chest tightness with the SOB.     HPI Francisco [name] 76 y.o. -acute visit for this patient.  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  He is chronically prednisone dependent.  He also is on schedule azithromycin.  He called in yesterday feeling unwell therefore we asked him to come in today.  He tells me that approximately a week ago he started having increased cough and congestion.  Yesterday primary care physician following a routine visit thought he was in COPD exacerbation and started him on doxycycline.  He also personally bumped up his baseline prednisone.  He does me that yesterday while he was shaving on room air he felt more short of breath than usual.  Also when he walked he desaturated more than usual therefore he decided to call in and come in today.  There  is no fever or chills or hemoptysis or colored sputum.  No leg edema.  No orthopnea.  He does not feel like he needs to be in the emergency department or get admitted.   OV 05/11/2018  Subjective:  Patient ID: Francisco [name], male , DOB: 1946-01-26 , age 82 y.o. , MRN: [id number] , ADDRESS: [place] [address]   05/11/2018 -   Chief Complaint  Patient presents with   Follow-up    pt states breathing has wrosen since last OV. increased sob, occ chest tightness, prod cough with yellow mucus & chest  congestion. currently taking trelegy samples. recent admission 04/07/18 for PE.     HPI Francisco [name] 76 y.o. -advanced COPD with chronic hypoxemic respiratory failure [not a BiPAP candidate because of lack of hypercapnia] presents with his wife for follow-up.  After the last visit he continued to have symptoms so on April 07, 2018 he got an outpatient CT scan and he had pulmonary embolism.  He got admitted to the hospital and then discharged and since then has been on Xarelto.  He continues his baseline 2-3 L of nasal cannula oxygen but he says since then he is more symptomatic.  Is also corresponds with him starting Trelegy inhaler and also for the last few to several weeks is having increased cough and congestion and yellow phlegm.  His COPD CAT score is deteriorated to 35 and above.  He is very frustrated by his symptoms.  Review of his medication shows that he takes azithromycin and chronic prednisone and Trelegy and oxygen for his COPD.  He is on Xarelto for his blood clots.  He is also on Fosamax for prednisone dependent osteoporosis management.  His smoking is in remission.  Wife says he is always sedentary.  Apparently they tried to do some physical therapy a month ago but he desaturated.  He does not seem to keen on physical therapy right now     OV 06/10/2018  Subjective:  Patient ID: Francisco [name], male , DOB: 12-13-1945 , age 29 y.o. , MRN: [id number] ,  ADDRESS: [place] [address]   06/10/2018 -   Chief Complaint  Patient presents with   Follow-up    follows for COPD. patient has increased SOB with exertion     HPI Francisco [name] 76 y.o. -returns for gold stage IV COPD follow-up with chronic hypoxemic respiratory failure.  I saw him approximately a month ago at which time recommended Trelegy.  However the Trelegy is not working well for him.  He feels Spiriva and Symbicort is of benefit for him.  Also recommended he start himself on Daliresp.  Given the GI side effect profile I communicated with his primary care physician about stopping Fosamax.  His primary care physician has advised Reclast infusion for him and is considering this but has not yet switched over.  This because the infusions will have to happen at [facility] long.  However he never started his Daliresp because he read the side effect profile and was worried about back pain.  He wanted me to go over the side effect profile of Daliresp all over again.  I printed up-to-date and went over all the side effects.  I did this with him and his wife.  There is a 3% incidence of backache.  The dominant feature is GI issues of weight loss and diarrhea and nausea and abdominal pain.  The second dominant feature is some anxiety issues.  After reading all this he has decided to start [place].  He is aware of the limitations as well.  He is aware that this is purely preventive in reducing COPD exacerbation frequency.  He understands that the burden on his health from frequent COPD exacerbations is high.  Currently COPD CAT score is back at baseline        OV 07/22/2018  Subjective:  Patient ID: Francisco [name], male , DOB: [birth year] , age 58 y.o. , MRN: [id number] , ADDRESS: [place] [address]   07/22/2018 -  Chief Complaint  Patient presents with   Follow-up    Pt states it has been rough since last visit. States he has been coughing with  white to yellow phlegm, wheezing, increased SOB which has been going on x2-3 weeks now. Pt denies any real complaints of chest tightness/chest pain.     HPI Francisco [name] 76 y.o. -presents for follow-up of his advanced COPD with chronic hypoxemic respiratory failure.  At last visit we introduce Daliresp as is an effort to prevent COPD flareups.  After some trepidation he started taking it.  He is currently on full dose 5 mcg daily and is tolerating it well.  However he does not think it is reduced the frequency of flareups but again it is too soon to tell.  Approximately over a week ago after some sick contact exposure he had symptoms and signs of COPD exacerbation.  He was given I suspect Levaquin but this caused some confusion.  After that he was changed to doxycycline.  Today's his last of 7 days with doxycycline he is better.  COPD CAT score is improved to 26 but he still is coughing quite a bit and has mucus and feels that he is still not fully back to baseline.  He had a chest x-ray July 13, 2018 that reports this is clear.  He continues on oxygen Spiriva and Symbicort.  There are no other new issues.  No chest pain edema hemoptysis or weight loss.    OV 03/05/2019  Subjective:  Patient ID: Francisco [name], male , DOB: 1946/02/08 , age 66 y.o. , MRN: [id number] , ADDRESS: [place] [address]   03/05/2019 -   Chief Complaint  Patient presents with   COPD with acute exacerbation    Feels it is a little worse since June. Has cough with congestion, was white in color. But this morning it has a yellow and green color to it.     HPI Francisco [name] 76 y.o. -returns for his advanced COPD follow-up.  He says that since his last visit he has been doing overall fair and stable.  Uses 3 L of oxygen, Daliresp, Spiriva, Symbicort.  He has been avoiding risk activities for COVID-19.  He says in the last few weeks he has had increased congestion he does not think this is  COVID-19.  Today he has green-yellow sputum.  He is asking about taking a flu shot.    OV 05/07/2019  Subjective:  Patient ID: Francisco [name], male , DOB: 1946/02/28 , age 81 y.o. , MRN: [id number] , ADDRESS: [place] [address]   05/07/2019 -   Chief Complaint  Patient presents with   Follow-up    Pt states his breathing is at his baseline. Pt c/o prod cough with clear mucus - baseline for pt. Pt denies CP/tightness and f/c/s.    Advance COPD with chronic hypoxemic respiratory failure.  Baseline functional status ECOG 3-4  HPI Francisco [name] 76 y.o. -presents for COPD follow-up.  He continues to be stable using oxygen.  His current COPD CAT score is 30 which is his baseline.  He recently had a vascular surgery.  He is on oxygen, prednisone, Daliresp, Spiriva and Symbicort.  He is gone back to taking azithromycin for prophylaxis m again COPD exacerbation.        OV 11/11/2019  Subjective:  Patient ID: Francisco [name], male , DOB: 1945-10-13 ,  age 70 y.o. , MRN: [id number] , ADDRESS: [place] [address]   11/11/2019 -   Chief Complaint  Patient presents with   Follow-up    Pt states his breathing is progressively becoming worse and states he feels like he may be in a flare with the COPD. Pt states he is coughing up yellow-green phlegm and also has complaints of wheezing.   Advance COPD patient with recurrent COPD exacerbations.  HPI Francisco [name] 76 y.o. -presents for follow-up.  He says last month he saw [name] nurse practitioner.  He says he was given a different antibiotic that really helped.  Review of the records indicate one time it was Augmentin another time it was [place].  He believes the second 1 really helped.  He feels in another exacerbation.  He is having green sputum.  He says with the flutter valve and exacerbation a lot of sputum comes out but between exacerbations is very minimal.  He is frustrated by his  recurrent exacerbations.  He did not tolerate Trelegy in the past.  He is on Spiriva, Symbicort, oxygen 3 L, flutter valve, Flonase, Roflumilast and 3 times a week of azithromycin.  He is looking to see if he can improve his quality of life.  His COPD CAT score is 34.  His last CT scan of the chest was in 2019.     CAT Score 11/11/2019 03/05/2019 04/03/2018  Total CAT Score 32 30 30     OV 11/26/2019 - telephone visit. 2PHI identified. Risks, benefit, limitation explained  Subjective:  Patient ID: Francisco [name], male , DOB: 08/17/1945 , age 41 y.o. , MRN: [id number] , ADDRESS: [place] [address] Advanced COPD with recurrent exacerbation now colonized by pseudomonas  11/26/2019 -    HPI Francisco [name] 76 y.o. -last seen by myself Nov 11, 2019.  At that time we decided to do a work-up for recurrent exacerbations.  His immunoglobulin levels are normal.  His sputum grew Pseudomonas that is pansensitive.  He CT scan of the chest shows right upper lobe worsening fibrotic pattern.  There is no mass per se.  I personally visualized the CT chest.  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  Today in the visit he tells me after 3 days he had nausea and then he had joint pain on the third day and quit taking it.  He still continues to be symptomatic.     ROS - per HPI  CT Chest Wo Contrast  Result Date: 11/25/2019 CLINICAL DATA:  COPD exacerbation. Severe shortness of breath. EXAM: CT CHEST WITHOUT CONTRAST TECHNIQUE: Multidetector CT imaging of the chest was performed following the standard protocol without IV contrast. COMPARISON:  04/07/2018. FINDINGS: Cardiovascular: The heart size is normal. There is no pericardial effusion. Aortic atherosclerosis. Three vessel coronary artery atherosclerotic calcifications identified Mediastinum/Nodes: No enlarged mediastinal or axillary  lymph nodes. Thyroid gland, trachea, and esophagus demonstrate no significant findings. Lungs/Pleura: No pleural effusion identified. Advanced changes of centrilobular and paraseptal emphysema with mild bullous changes. Progressive confluent fibrotic with extensive architectural distortion and volume loss no acute airspace consolidation, atelectasis or pneumothorax. Scattered, bilateral areas of pleuroparenchymal scarring identified changes within the apical portions of the right upper lobe are again noted. Upper Abdomen: Bilateral low-attenuation adrenal nodules appears similar to previous exam and are compatible with benign  adenomas. No acute abnormality identified within the imaged portions of the upper abdomen. Musculoskeletal: Mild scoliosis and degenerative disc disease noted within the thoracic spine. Stable chronic compression deformities at T6, T7 and T8. No new compression fractures. IMPRESSION: 1. No acute cardiopulmonary abnormalities. 2. Progressive confluent fibrotic changes within the right upper lobe with extensive architectural distortion and volume loss. Likely the sequelae of prior inflammation/infection. 3. Aortic atherosclerosis, in addition to 3 vessel coronary artery disease. Please note that although the presence of coronary artery calcium documents the presence of coronary artery disease, the severity of this disease and any potential stenosis cannot be assessed on this non-gated CT examination. Assessment for potential risk factor modification, dietary therapy or pharmacologic therapy may be warranted, if clinically indicated. 4. Bilateral adrenal adenomas. 5. Stable chronic compression deformities at T6, T7 and T8. 6. Diffuse bronchial wall thickening with emphysema, as above; imaging findings suggestive of underlying COPD. Aortic Atherosclerosis (ICD10-I70.0) and Emphysema (ICD10-J43.9). Electronically Signed   By: Kerby Moors M.D.   On: 11/25/2019 12:21     OV  05/03/2020  Subjective:  Patient ID: Francisco [name], male , DOB: 1945/12/01 , age 35 y.o. , MRN: [id number] , ADDRESS: [place] [address] PCP [name], MD Patient Care Team: [name], MD as PCP - General (Family Medicine) Brand Males, MD as Consulting Physician (Pulmonary Disease) Danella Sensing, MD as Consulting Physician (Dermatology) Dr. Henreitta Leber, DDS (Dentistry)  This Provider for this visit: Treatment Team:  Attending Provider: Brand Males, MD    05/03/2020 -   Chief Complaint  Patient presents with   Follow-up    doing best he has in a year and a half.     ICD-10-CM   1. Stage 4 very severe COPD by GOLD classification ([place])  J44.9   2. Chronic respiratory failure with hypoxia (HCC)  J96.11   3. COPD, frequent exacerbations ([place])  J44.1   4. Abnormal findings on diagnostic imaging of lung  R91.8   5. Pulmonary embolism, unspecified chronicity, unspecified pulmonary embolism type, unspecified whether acute cor pulmonale present (HCC)  I26.99   6. Vaccine counseling  Z71.85      HPI Francisco [name] 76 y.o. -presents with his wife for the above issues.  He said his Covid booster has had his flu shot.  He has follow-up coming end of November with primary care physician.  He wants me to send a note to the primary care physician.  He is without any exacerbations.  He continues prednisone, azithromycin, Roflumilast, Spiriva and Symbicort.  He does not want to do triple inhaler therapies.  He is on anticoagulation.  He says after the GI bleeding issues resolved he is better.  He had a CT scan of the chest that is documented below.  His infiltrates are improving but there is mucus/debris in the main airway he is denying any aspiration.  He does have hiatal hernia.  Apparently this was discovered during endoscopy.  CAT Score 05/03/2020 11/11/2019 03/05/2019 04/03/2018  Total CAT Score 16 32 30 30     IMPRESSION: 1. Trace right  pleural effusion and associated atelectasis or consolidation, both of which are improved compared to prior examination. Findings are generally consistent with resolving sequelae of prior infection or aspiration. 2. Diffuse bilateral bronchial wall thickening consistent with nonspecific infectious or inflammatory bronchitis. 3. Frothy debris in the bilateral mainstem bronchi, concerning for aspiration. 4. Severe centrilobular emphysema. Emphysema (ICD10-J43.9). 5. Nonspecific fibrotic  scarring of the right greater than left lung apices, likely sequelae of prior infection or inflammation. 6. Coronary artery disease.  Aortic Atherosclerosis (ICD10-I70.0).     Electronically Signed   By: Eddie Candle M.D.   On: 04/26/2020 13:42   ROS - per HPI  OV 08/31/2020  Subjective:  Patient ID: Francisco [name], male , DOB: 11/11/1945 , age 45 y.o. , MRN: [id number] , ADDRESS: [place] [address] PCP [name], MD Patient Care Team: [name], MD as PCP - General (Family Medicine) Brand Males, MD as Consulting Physician (Pulmonary Disease) Danella Sensing, MD as Consulting Physician (Dermatology) Dr. Henreitta Leber, DDS (Dentistry)  This Provider for this visit: Treatment Team:  Attending Provider: Brand Males, MD    08/31/2020 -   Chief Complaint  Patient presents with   Follow-up    More congestion at the moment, more SOB   Advanced end-stage COPD on oxygen and ECOG 3-4 History of pulmonary embolism on anticoagulation  HPI Francisco [name] 76 y.o. -presents for follow-up.  Presents with his wife he sitting on wheelchair.  He says for the last few weeks he is having a flareup with increased urine production that is green.  He wants antibiotics.  He says in December 2021 I switched his Omnicef to ciprofloxacin based on Pseudomonas and sensitivity but he actually felt the [place] worked better.  However he said that the duration again was too  short and he actually wants Omnicef again for 14 days at this time.  He also thinks a prednisone taper/burst would help him.  Otherwise he continues on Spiriva Symbicort Daliresp 3 times weekly azithromycin and daily prednisone 10 mg.  Is also on oxygen.  He is on anticoagulation for his pulmonary embolism.  He fell down and broke his back but did not have any bleeding.  He is going to have an MRI.  Noted that he is not on any Mucinex and is going to start this after my advice.  We discussed about switching his Breo and Symbicort to a triple inhaler combination either Trelegy or BREZTRI but he says both of not working he does not want to do it.   OV 04/26/2021  Subjective:  Patient ID: Francisco [name], male , DOB: 09/17/1945 , age 24 y.o. , MRN: [id number] , ADDRESS: [address] [place] [address] PCP [name], MD Patient Care Team: [name], MD as PCP - General (Family Medicine) Brand Males, MD as Consulting Physician (Pulmonary Disease) Danella Sensing, MD as Consulting Physician (Dermatology) Dr. Henreitta Leber, DDS (Dentistry)  This Provider for this visit: Treatment Team:  Attending Provider: Brand Males, MD    04/26/2021 -   Chief Complaint  Patient presents with   Follow-up    Pt states he is about the same since last visit. Has problems with coughing and will get up occ white to yellow phlegm and also has had some wheezing.    Advanced end-stage COPD on oxygen and ECOG 3-4 - last CT Oct 2021 History of pulmonary embolism on anticoagulation - June 2021 Iron def anemia - follows Dr Julien Nordmann  HPI Francisco [name] 76 y.o. -returns for follow-up with his wife.  His ECOG continues to be 3-4.  He is wheelchair and minimal activities.  He is on oxygen, Spiriva, Symbicort, azithromycin for preventing COPD exacerbations, Daliresp, daily prednisone.  Is not using 3% saline nebulizer.  Instead he says flutter valve  works well for him.  Last seen in  February 2022 and since then no exacerbations.  The longest time he has had without exacerbation he is happy about it.  He has some waxing and waning sputum production.  Few days ago it was yellow but now clear.  He is going to monitor this.  He is up-to-date with his COVID vaccines.  He is no longer following with Dr. Julien Nordmann for his iron deficiency anemia.  He is on anticoagulation for his PE in June 2021.    CT Chest data  No results found.    PFT  No flowsheet data found.  OV 07/26/2021  Subjective:  Patient ID: Francisco [name], male , DOB: May 28, 1946 , age 88 y.o. , MRN: [id number] , ADDRESS: [address] [place] [address] PCP [name], MD Patient Care Team: [name], MD as PCP - General (Family Medicine) Brand Males, MD as Consulting Physician (Pulmonary Disease) Danella Sensing, MD as Consulting Physician (Dermatology) Dr. Henreitta Leber, DDS (Dentistry)  This Provider for this visit: Treatment Team:  Attending Provider: Brand Males, MD    07/26/2021 -   Chief Complaint  Patient presents with   Follow-up    Pt is here to discuss results of recent CT.    Advanced end-stage COPD on oxygen and ECOG 3-4 - last CT Oct 2021 History of pulmonary embolism on anticoagulation - June 2021 Iron def anemia - follows Dr Julien Nordmann Right lower lobe superior segment nodule - 9 m -January 2023 new onset  HPI Francisco [name] 76 y.o. -follows for his COPD.  Presents with his wife.  He continues on his oxygen at 4 L.  He feels he is beginning to have a COPD exacerbation.  He did have a CT scan of the chest that was routine.  It shows a 9 mm right lower lobe superior segment nodule.  I looked at it it looks solid and lobulated.  I think there is a fair probability greater than 65% this is malignancy.  He is asymptomatic from it.  He just wants preemptive antibiotics and prednisone burst for his incipient COPD exacerbation.  In terms of his pulmonary  embolism from 2 and half years ago he continues his Eliquis.  The Eliquis at low-dose for prophylaxis.  His D-dimer in the past has been normal.  But we decided to continue the low-dose Eliquis.  We will check his D-dimer again.    CAT Score 04/26/2021 08/31/2020 05/03/2020 11/11/2019  Total CAT Score _0 32        CT chest 07/10/21  Narrative & Impression  CLINICAL DATA:  COPD exacerbation. Respiratory failure with hypoxia.   EXAM: CT CHEST WITHOUT CONTRAST   TECHNIQUE: Multidetector CT imaging of the chest was performed following the standard protocol without IV contrast.   COMPARISON:  04/25/2020   FINDINGS: Cardiovascular: The heart size is within normal limits. No pericardial effusion. Aortic atherosclerosis and coronary artery calcifications.   Mediastinum/Nodes: No enlarged mediastinal or axillary lymph nodes. Thyroid gland, trachea, and esophagus demonstrate no significant findings.   Lungs/Pleura: Severe changes of centrilobular and paraseptal emphysema. Diffuse bronchial wall thickening. No pleural effusion or acute airspace consolidation. No interstitial edema. Scarring and masslike architectural distortion within the right apex appears unchanged from the previous exam. Within the superior segment of right lower lobe there is a lung nodule measuring 1.1 x 0.6 cm (mean diameter 9 mm), image 82/3. New compared with the previous exam.  Upper Abdomen: No acute findings within the imaged portions of the upper abdomen. Aortic atherosclerosis.   Musculoskeletal: Curvature of the thoracic spine is convex towards the right. Compression fractures are again noted involving T6, T7 and T8. Unchanged from previous exam.   IMPRESSION: 1. No acute cardiopulmonary abnormalities. 2. There is a new pulmonary nodule within the superior segment of right lower lobe with a mean diameter of 9 mm. Consider a non-contrast Chest CT at 3 months, a PET/CT, or tissue  sampling. These guidelines do not apply to immunocompromised patients and patients with cancer. Follow up in patients with significant comorbidities as clinically warranted. For lung cancer screening, adhere to Lung-RADS guidelines. Reference: Radiology. 2017; 284(1):228-43. 3. Stable scarring and masslike architectural distortion within the right apex. 4. Diffuse bronchial wall thickening with emphysema, as above; imaging findings suggestive of underlying COPD. 5. Stable thoracic compression fractures. 6. Aortic Atherosclerosis (ICD10-I70.0) and Emphysema (ICD10-J43.9).     Electronically Signed   By: Kerby Moors M.D.   On: 07/10/2021 13:33       09/11/2021 Pt. Presents for follow up. He had Flexible video fiberoptic bronchoscopy with robotic assistance and biopsies on 09/04/2021. He states he has done well after his biopsy. No significant bleeding. Just a scan amount the day after. He did have a lot of rib pain , that has subsequently improved. I gave the patient the biopsy results. We discussed this in full to include treatment options and all questions were asked and answered. [name] Kitchen   He says he is currently in the middle of a COPD flare. He has thick yellow green secretions .He also endorses a bad cough.  He does have a flutter valve. He does endorse some wheezing.He resumed his Eliquis on 09/06/2021.We will treat him for his COPD flare today.He is compliant with his Daliresp, Spiriva and Symbicort.    I have referred to radiation oncology, as he is oxygen dependent and in a wheelchair, SBRT is best option for this single nodule. . Both he and his wife had  questions that were answered. I have asked them to call the office if they need anything at all.   Test Results: Cytology 09/04/2021 MICROSCOPIC DIAGNOSIS:  A. 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  Immunohistochemistry shows  patchy positivity with cytokeratin 5/6 and weak positivity with CD56.  The malignant cells are negative with TTF-1, chromogranin,  synaptophysin, p40 and p63.  Ki-67 shows a high proliferation rate.   PET scan 08/08/2021 Solitary hypermetabolic pulmonary nodule in the RIGHT lower lobe with intense metabolic activity is most consistent PRIMARY BRONCHOGENIC CARCINOMA. 2. No mediastinal adenopathy, supraclavicular adenopathy or distant metastatic disease. No skeletal metastasis.     OV 12/05/2021  Subjective:  Patient ID: Francisco [name], male , DOB: [birth year] , age 82 y.o. , MRN: [id number] , ADDRESS: [address] [place] [id number] PCP [name], MD Patient Care Team: [name], MD as PCP - General (Family Medicine) Brand Males, MD as Consulting Physician (Pulmonary Disease) Danella Sensing, MD as Consulting Physician (Dermatology) Dr. Henreitta Leber, DDS (Dentistry)  This Provider for this visit: Treatment Team:  Attending Provider: Brand Males, MD    12/05/2021 -   Chief Complaint  Patient presents  with   Follow-up    Pt states since his last radiation treatment, his breathing has become a little worse. States his cough is about the same.    HPI Francisco [name] 76 y.o. -return to see me.  I presents with his wife.  I am seeing him for the first time after the diagnose of cancer.  He said radiation 3 times.  He says after the radiation he is a little bit more short of breath.  He is now needing 4 L nasal cannula.  When he exerts he can desaturate into the 80s and sometimes into the 70s.  He is able to recover with resting.  His amount of cough and sputum is around the same.  It is between clear and green and yellow.  He is not in a COPD exacerbation currently but according to his wife it appears that he might get  one by next week.  She says whenever the antibiotic cephalosporin is on and prednisone he is doing great but otherwise he will have a flareup.  He is already on azithromycin 3 times a week and also Roflumilast for prevention of COPD flareup.  Despite this he gets recurrent COPD flareup.  Reviewed his eosinophilic profile.  Prior to prednisone some of the eosinophils were as high as 300 cells per cubic millimeter.  We did discuss about the fact that [place] might be effective but is eosinophilic count can be obliterated by the prednisone.  He is willing to try this if he has eosinophilia or if you are able to get insurance justification for COPD/asthma overlap.  He does behave like COPD and asthma overlap.  Currently his medications include Symbicort, Spiriva, Daliresp, azithromycin, Flonase and oxygen  He and his wife are requesting a prescription for COPD exacerbation be sent   He has upcoming appointment Dr. Julien Nordmann.  He does not think he will be a good candidate for chemotherapy CT Chest data  No results found.      OV 03/07/2022  Subjective:  Patient ID: Francisco [name], male , DOB: 07/01/1946 , age 3 y.o. , MRN: [id number] , ADDRESS: [address] [place] [facility] [address] PCP [name], MD Patient Care Team: [name], MD as PCP - General (Family Medicine) Brand Males, MD as Consulting Physician (Pulmonary Disease) Danella Sensing, MD as Consulting Physician (Dermatology) Dr. Henreitta Leber, DDS (Dentistry)  This Provider for this visit: Treatment Team:  Attending Provider: Brand Males, MD  03/07/2022 -   Chief Complaint  Patient presents with   Follow-up    Pt states he has been doing about the same since last visit.     HPI Francisco [name] 76 y.o. -returns for follow-up of his COPD.  Since last visit no flareups.  He is doing stable.  Overall the same.  Continues all his medications.  Ordered blood gas last time but for some reason it was not  done.  We did blood eosinophils and it was 200 cells per cubic millimeter suppressed by prednisone.  He did not qualify for ipilimumab.  I recommended we do this again and he is willing.  He has upcoming labs with Dr. Garret Reddish March 14, 2022.  I have sent a secure chat to Dr. Yong Channel to get this done.  We talked about getting the full vaccines of RSV, flu and COVID.  He plans to do that.  No new issues.  His wife is here with him.  No data to display          Latest Reference Range & Units 10/31/08 13:57 02/12/10 08:32 01/31/11 08:26 10/23/11 09:34 01/15/12 08:08 02/09/13 09:14 02/25/14 08:55 06/05/16 08:23 12/06/16 07:54 06/25/17 08:19 12/25/17 08:50 04/03/18 11:48 04/07/18 11:46 06/02/19 11:05 11/29/19 09:26 02/18/20 16:45 03/10/20 11:07 07/06/20 09:41 08/23/20 12:57 10/04/20 14:53 12/07/20 08:48 12/10/21 08:49  Eosinophils Absolute 0.0 - 0.7 K/uL 0.1 0.2 0.2 0.0 0.2 0.1 0.3 0.1 0.2 0.2 0.0 0.0 0.0 0.0 0.2 0.0 53 157 0.0 0.0 0.2 0.2     OV 06/06/2022  Subjective:  Patient ID: Francisco [name], male , DOB: October 11, 1945 , age 76 y.o. , MRN: [id number] , ADDRESS: [address] [place] [address] PCP [name], MD Patient Care Team: [name], MD as PCP - General (Family Medicine) Brand Males, MD as Consulting Physician (Pulmonary Disease) Danella Sensing, MD as Consulting Physician (Dermatology) Dr. 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 Valeta Harms  -Status post SBRT by Dr. Lisbeth Renshaw April 2023  06/06/2022 -    Chief Complaint  Patient presents with   Follow-up    Pt states he has been doing okay since last visit. Breathing is about the same as last visit. States he is coughing more and getting up green phlegm.     HPI Francisco [name] 76 y.o. -return for follow-up for COPD.  Routine follow-up.  No new admissions.  No emergency room visits but for the last 1 week he feels he is coming down with a COPD exacerbation.  Disability.  He is scheduled to get the RSV vaccine today.  There is no fever.  He feels an antibiotic and prednisone can help him.  In the past he is taking doxycycline.  For his frequent COPD exacerbations he is on Zithromax 3 times a week, daily prednisone, Roflumilast, Symbicort and Spiriva.  In the past we have checked his eosinophils in the been borderline but not elevated enough to warrant Dupixent in this because of his chronic prednisone.  He is agreed for CBC with differential testing today.  We discussed open noninvasive ventilation.  I gave him an article.  He wants to reflect on it he is not ready to make a decision today.    CT Chest data  No results found.   Latest Reference Range & Units 10/31/08 13:57 02/12/10 08:32 01/31/11 08:26 10/23/11 09:34 01/15/12 08:08 02/09/13 09:14 02/25/14 08:55 06/05/16 08:23 12/06/16 07:54 06/25/17 08:19 12/25/17 08:50 04/03/18 11:48 04/07/18 11:46 06/02/19 11:05 11/29/19 09:26 02/18/20 16:45 03/10/20 11:07 07/06/20 09:41 08/23/20 12:57 10/04/20 14:53 12/07/20 08:48 12/10/21 08:49 03/14/22 09:59  Eosinophils Absolute 0.0 - 0.7 K/uL 0.1 0.2 0.2 0.0 0.2 0.1 0.3 0.1 0.2 0.2 0.0 0.0 0.0 0.0 0.2 0.0 53 157 0.0 0.0 0.2 0.2 0.1    PFT      No data to display             has a past medical history of Chronic back pain, Chronic rhinitis, Compressed spine fracture ([place]), COPD (  chronic obstructive pulmonary disease) (HCC), Dyspnea, Emphysema lung ([place]), On home oxygen therapy, Onychomycosis, Orthostatic hypotension, PAD (peripheral artery  disease) ([place]), Pneumonia, Pulmonary embolism ([place]) (04/07/2018), Skin cancer, Small cell lung cancer, right ([place]) (2023), and Vertigo.   reports that he quit smoking about 4 years ago. His smoking use included pipe and cigarettes. He started smoking about 56 years ago. He has a 104.00 pack-year smoking history. He has never used smokeless tobacco.  Past Surgical History:  Procedure Laterality Date   ABDOMINAL AORTOGRAM W/LOWER EXTREMITY N/A 01/21/2020   Procedure: ABDOMINAL AORTOGRAM W/LOWER EXTREMITY;  Surgeon: Elam Dutch, MD;  Location: [place] [facility];  Service: Cardiovascular;  Laterality: N/A;   APPENDECTOMY     BIOPSY  02/20/2020   Procedure: BIOPSY;  Surgeon: Rush Landmark Telford Nab., MD;  Location: [place];  Service: Gastroenterology;;   BRONCHIAL BIOPSY  09/04/2021   Procedure: BRONCHIAL BIOPSIES;  Surgeon: Garner Nash, DO;  Location: [place] [facility];  Service: Pulmonary;;   BRONCHIAL BRUSHINGS  09/04/2021   Procedure: BRONCHIAL BRUSHINGS;  Surgeon: Garner Nash, DO;  Location: [place];  Service: Pulmonary;;   BRONCHIAL NEEDLE ASPIRATION BIOPSY  09/04/2021   Procedure: BRONCHIAL NEEDLE ASPIRATION BIOPSIES;  Surgeon: Garner Nash, DO;  Location: [place];  Service: Pulmonary;;   CATARACT EXTRACTION, BILATERAL     ENDARTERECTOMY FEMORAL Right 04/19/2019   Procedure: ENDARTERECTOMY RIGHT COMMON FEMORAL;  Surgeon: Elam Dutch, MD;  Location: [facility];  Service: Vascular;  Laterality: Right;   ENDARTERECTOMY FEMORAL Left 01/24/2020   Procedure: LEFT FEMORAL ENDARTERECTOMY WITH DACRON PATCH ANGIOPLASTY;  Surgeon: Elam Dutch, MD;  [facility];  Service: Vascular;  Laterality: Left;   ESOPHAGOGASTRODUODENOSCOPY (EGD) WITH PROPOFOL N/A 02/20/2020   Procedure: ESOPHAGOGASTRODUODENOSCOPY (EGD) WITH PROPOFOL;  Surgeon: Irving Copas., MD;  Location: [place];  Service: Gastroenterology;  Laterality: N/A;   FEMORAL ENDARTERECTOMY Left  01/24/2020   FIDUCIAL MARKER PLACEMENT  09/04/2021   Procedure: FIDUCIAL MARKER PLACEMENT;  Surgeon: Garner Nash, DO;  Location: [place];  Service: Pulmonary;;   HEMOSTASIS CLIP PLACEMENT  02/20/2020   Procedure: HEMOSTASIS CLIP PLACEMENT;  Surgeon: Irving Copas., MD;  Location: [place];  Service: Gastroenterology;;   HOT HEMOSTASIS N/A 02/20/2020   Procedure: HOT HEMOSTASIS (ARGON PLASMA COAGULATION/BICAP);  Surgeon: Irving Copas., MD;  Location: [place];  Service: Gastroenterology;  Laterality: N/A;   INSERTION OF ILIAC STENT Left 01/24/2020   Procedure: INSERTION OF VBX STENT 8X59 AND 8X39 LEFT COMMON ILIAC ARTERY. INSERTION OF INNOVA 7 X 60 INNOVA STENT LEFT EXTERNAL ILIAC ARTERY. ;  Surgeon: Elam Dutch, MD;  Location: [place];  Service: Vascular;  Laterality: Left;   LOWER EXTREMITY ANGIOGRAPHY  12/11/2018   LOWER EXTREMITY ANGIOGRAPHY N/A 12/11/2018   Procedure: LOWER EXTREMITY ANGIOGRAPHY;  Surgeon: Elam Dutch, MD;  Location: [place] [facility];  Service: Cardiovascular;  Laterality: N/A;   PATCH ANGIOPLASTY Right 04/19/2019   Procedure: Patch Angioplasty;  Surgeon: Elam Dutch, MD;  Location: [facility] OR;  Service: Vascular;  Laterality: Right;   PERIPHERAL VASCULAR INTERVENTION Right 12/11/2018   Procedure: PERIPHERAL VASCULAR INTERVENTION;  Surgeon: Elam Dutch, MD;  Location: [place] [facility];  Service: Cardiovascular;  Laterality: Right;  Common Iliac    SKIN CANCER EXCISION     "lips, face, ears, arms" (04/07/2018)   TONSILLECTOMY     ULTRASOUND GUIDANCE FOR VASCULAR ACCESS Right 01/24/2020   Procedure: ULTRASOUND GUIDANCE FOR VASCULAR ACCESS;  Surgeon: Elam Dutch,  MD;  [facility];  Service: Vascular;  Laterality: Right;   VIDEO BRONCHOSCOPY WITH RADIAL ENDOBRONCHIAL ULTRASOUND  09/04/2021   Procedure: VIDEO BRONCHOSCOPY WITH RADIAL ENDOBRONCHIAL ULTRASOUND;  Surgeon: Garner Nash, DO;  Location: [place];   Service: Pulmonary;;    Allergies  Allergen Reactions   Tape Other (See Comments)    SKIN IS VERY THIN, TEARS SKIN; CAN ONLY USE COBAN WRAPS DUE TO CONDITION OF SKIN!!   Amoxicillin-Pot Clavulanate Other (See Comments)    Thrush/ Sore throat/Voice became hoarse   Ciprofloxacin Nausea Only    Sick on stomach, weak/tired   Levaquin [Levofloxacin] Other (See Comments)    hallucinations   Lipitor [Atorvastatin] Rash    Immunization History  Administered Date(s) Administered   Fluad Quad(high Dose 65+) 03/16/2019, 03/10/2020   Influenza Split 04/24/2012   Influenza Whole 05/08/2009, 04/08/2011   Influenza, High Dose Seasonal PF 05/01/2017, 03/31/2018, 04/17/2021, 04/03/2022   Influenza,inj,Quad PF,6+ Mos 04/27/2013, 04/15/2014, 04/24/2015   Influenza,inj,quad, With Preservative 05/01/2018   Influenza-Unspecified 05/13/2016   PFIZER Comirnaty(Gray Top)Covid-19 Tri-Sucrose Vaccine 11/24/2020, 04/03/2022   PFIZER(Purple Top)SARS-COV-2 Vaccination 07/30/2019, 08/20/2019, 04/07/2020   Pfizer Covid-19 Vaccine Bivalent Booster 50yr & up 03/16/2021   Pneumococcal Conjugate-13 02/25/2014   Pneumococcal Polysaccharide-23 08/15/2011, 12/19/2016   Pneumococcal-Unspecified 03/08/2017   Td 02/21/2010   Tdap 11/05/2017   Zoster, Live 01/29/2012    Family History  Problem Relation Age of Onset   Heart disease Mother        CABG in her 830s nonsmoker   Cancer Mother    Stroke Father    Heart disease Father        Died of MI at age 74 smoker   Hepatitis Sister    Coronary artery disease Other        male 1st degree relative <60   Colon cancer Neg Hx    Pancreatic cancer Neg Hx    Esophageal cancer Neg Hx    Inflammatory bowel disease Neg Hx    Liver disease Neg Hx    Rectal cancer Neg Hx    Stomach cancer Neg Hx      Current Outpatient Medications:    acetaminophen (TYLENOL) 500 MG tablet, Take 1,000 mg by mouth every 6 (six) hours as needed for moderate pain. , Disp: , Rfl:     albuterol (VENTOLIN HFA) 108 (90 Base) MCG/ACT inhaler, USE 2 INHALATIONS BY MOUTH  EVERY 6 HOURS AS NEEDED FOR WHEEZING, Disp: 72 g, Rfl: 3   azithromycin (ZITHROMAX) 250 MG tablet, Take 250 mg by mouth 3 (three) times a week., Disp: , Rfl:    Calcium Carbonate-Vitamin D (CALCIUM 600+D PO), Take 2 tablets by mouth daily., Disp: , Rfl:    doxycycline (VIBRA-TABS) 100 MG tablet, Take 1 tablet (100 mg total) by mouth 2 (two) times daily., Disp: 10 tablet, Rfl: 0   ELIQUIS 2.5 MG TABS tablet, TAKE 1 TABLET BY MOUTH TWICE  DAILY, Disp: 180 tablet, Rfl: 3   Ferrous Sulfate (IRON) 325 (65 Fe) MG TABS, Take 325 mg by mouth every other day., Disp: , Rfl:    fluticasone (FLONASE) 50 MCG/ACT nasal spray, USE 2 SPRAYS IN BOTH  NOSTRILS DAILY, Disp: 48 g, Rfl: 3   folic acid (FOLVITE) 1 MG tablet, Take 1 tablet (1 mg total) by mouth daily., Disp: , Rfl:    furosemide (LASIX) 20 MG tablet, TAKE 1 TABLET BY MOUTH  DAILY AS NEEDED FOR FLUID  OR EDEMA, Disp: 90 tablet, Rfl: 3  hydrOXYzine (VISTARIL) 25 MG capsule, Take 1 capsule (25 mg total) by mouth every 8 (eight) hours as needed., Disp: 30 capsule, Rfl: 0   magic mouthwash (nystatin, lidocaine, diphenhydrAMINE, alum & mag hydroxide) suspension, Swish and spit 5 mLs 4 (four) times daily., Disp: 180 mL, Rfl: 1   magic mouthwash SOLN, Take 5 mLs by mouth 3 (three) times daily as needed for mouth pain., Disp: 150 mL, Rfl: 0   Multiple Vitamin (MULTIVITAMIN WITH MINERALS) TABS tablet, Take 1 tablet by mouth daily., Disp: , Rfl:    mupirocin cream (BACTROBAN) 2 %, Apply 1 application topically 2 (two) times daily. (Patient taking differently: Apply 1 application  topically daily as needed (Cyst).), Disp: 22 g, Rfl: 0   oxyCODONE (OXY IR/ROXICODONE) 5 MG immediate release tablet, Take 1 tablet (5 mg total) by mouth every 4 (four) hours as needed for severe pain., Disp: 60 tablet, Rfl: 0   oxycodone (OXY-IR) 5 MG capsule, Take 1 capsule (5 mg total) by mouth every 6  (six) hours as needed (arm pain after fall- particularly with dressing changes due to sticking issue)., Disp: 20 capsule, Rfl: 0   OXYGEN, Inhale 4-5 L into the lungs continuous. , Disp: , Rfl:    pantoprazole (PROTONIX) 20 MG tablet, TAKE 1 TABLET BY MOUTH  DAILY, Disp: 90 tablet, Rfl: 3   Polyethyl Glycol-Propyl Glycol (SYSTANE) 0.4-0.3 % SOLN, Place 1 drop into both eyes every other day., Disp: , Rfl:    predniSONE (DELTASONE) 10 MG tablet, Take 1 tablet (10 mg total) by mouth daily with breakfast., Disp: 90 tablet, Rfl: 3   predniSONE (DELTASONE) 10 MG tablet, Take 4 tablets (40 mg total) by mouth daily with breakfast for 2 days, THEN 3 tablets (30 mg total) daily with breakfast for 2 days, THEN 2 tablets (20 mg total) daily with breakfast for 2 days, THEN 1 tablet (10 mg total) daily with breakfast for 2 days., Disp: 20 tablet, Rfl: 0   Respiratory Therapy Supplies (FLUTTER) DEVI, 10 times Twice a day and prn as needed, may increase if feeling worse, Disp: 1 each, Rfl: 0   roflumilast (DALIRESP) 500 MCG TABS tablet, TAKE 1 TABLET BY MOUTH  DAILY, Disp: 90 tablet, Rfl: 3   rosuvastatin (CRESTOR) 10 MG tablet, TAKE 1 TABLET BY MOUTH DAILY, Disp: 90 tablet, Rfl: 3   SPIRIVA RESPIMAT 2.5 MCG/ACT AERS, USE 2 INHALATIONS BY MOUTH  DAILY, Disp: 12 g, Rfl: 3   SYMBICORT 160-4.5 MCG/ACT inhaler, USE 2 INHALATIONS BY MOUTH  TWICE DAILY, Disp: 30.6 g, Rfl: 3   triamcinolone cream (KENALOG) 0.1 %, Apply 1 application. topically 2 (two) times daily. For 7-10 days maximum, Disp: 80 g, Rfl: 0      Objective:   Vitals:   06/06/22 0932  BP: 112/68  Pulse: (!) 107  Temp: 97.8 F (36.6 C)  TempSrc: Oral  SpO2: 93%  Weight: 182 lb (82.6 kg)  Height: 6' (1.829 m)    Estimated body mass index is 24.68 kg/m as calculated from the following:   Height as of this encounter: 6' (1.829 m).   Weight as of this encounter: 182 lb (82.6 kg).  _0 @  Filed Weights   06/06/22 0932  Weight: 182 lb  (82.6 kg)     Physical Exam    General: No distress.  Deconditioned cushingoid male sitting in the wheelchair Neuro: Alert and Oriented x 3. GCS 15. Speech normal Psych: Pleasant Resp:  Barrel Chest - yes.  Wheeze - mild  expiratory, Crackles - no, No overt respiratory distress CVS: Normal heart sounds. Murmurs - no Ext: Stigmata of Connective Tissue Disease - NO but has chronic venous stasis edema HEENT: Normal upper airway. PEERL +. No post nasal drip        Assessment:       ICD-10-CM   1. COPD with acute exacerbation ([place])  J44.1     2. COPD, frequent exacerbations ([place])  J44.1 CBC with Differential/Platelet    3. Stage 4 very severe COPD by GOLD classification ([place])  J44.9 CBC with Differential/Platelet         Plan:     Patient Instructions  Nodule of lower lobe of right lung - 68m new Jul 10, 2021. 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  CT June 2023 with response  Plan - per DR MJulien Nordmannand MLisbeth Renshaw- CT scan timing  COPD with acute exacerbation ([facility] COPD, frequent exacerbations ([place]  - early flare up 06/06/2022  Plan -  check CBC with diff 06/06/2022  - if eos high - can consider dupixemnt - discussed [id number] HILL-ROM open bipap -   - take article  - you will reflect on this - Take doxycycline 1028mpo twice daily x 5 days; take after meals and avoid sunlight  - hold azithromycin during this time - Take prednisone 40 mg daily x 2 days, then 3017maily x 2 days, then 56m11mily x 2 days, then 10mg60mly baseline to cntiue - Otherwise  continue  daily prednisone 10mg,88mthromycin MWF, and roflumilast daily per routine   Stage 4 very severe COPD by GOLD classification (HCC) C[place]ittleseynic respiratory failure with hypoxia (HCC)  [place]  --Continue oxygen, Spiriva, Symbicort, Daliresp and 3 times weekly azithromycin and daily prednisone otherwise (after flare up Rx)  History Pulmonary embolism -   -normal d-dimer Jan 2023  Plan  - -  continue low dose eliquis for now esp with cancer and leg edema  Vaccine counseling  Plan - [facility]: ok to get 06/06/2022    Follow-up -3 months with dR Kameah Rawl    SIGNATURE    Dr. MuraliBrand Males, F.C.C.P,  Pulmonary and Critical Care Medicine Staff Physician, [facility]  [facility]  [place] [id number]: [phone] answer or between  15:00h - 7:00h: call [phone] Telephone: [phone]  10:00 AM 06/06/2022

## 2022-06-10 ENCOUNTER — Encounter: Payer: Self-pay | Admitting: Family Medicine

## 2022-06-10 ENCOUNTER — Other Ambulatory Visit: Payer: Self-pay | Admitting: Medical Oncology

## 2022-06-10 DIAGNOSIS — C3490 Malignant neoplasm of unspecified part of unspecified bronchus or lung: Secondary | ICD-10-CM

## 2022-06-21 ENCOUNTER — Inpatient Hospital Stay: Payer: Medicare Other | Attending: Radiation Oncology

## 2022-06-21 ENCOUNTER — Ambulatory Visit (HOSPITAL_COMMUNITY)
Admission: RE | Admit: 2022-06-21 | Discharge: 2022-06-21 | Disposition: A | Discharge status: Home / Self Care | Payer: Medicare Other | Source: Ambulatory Visit | Attending: Internal Medicine | Admitting: Internal Medicine

## 2022-06-21 ENCOUNTER — Other Ambulatory Visit: Payer: Self-pay

## 2022-06-21 DIAGNOSIS — C3490 Malignant neoplasm of unspecified part of unspecified bronchus or lung: Secondary | ICD-10-CM

## 2022-06-21 DIAGNOSIS — Z923 Personal history of irradiation: Secondary | ICD-10-CM | POA: Insufficient documentation

## 2022-06-21 DIAGNOSIS — Z86711 Personal history of pulmonary embolism: Secondary | ICD-10-CM | POA: Insufficient documentation

## 2022-06-21 DIAGNOSIS — Z85118 Personal history of other malignant neoplasm of bronchus and lung: Secondary | ICD-10-CM | POA: Insufficient documentation

## 2022-06-21 DIAGNOSIS — Z7901 Long term (current) use of anticoagulants: Secondary | ICD-10-CM | POA: Insufficient documentation

## 2022-06-21 DIAGNOSIS — Z79899 Other long term (current) drug therapy: Secondary | ICD-10-CM | POA: Insufficient documentation

## 2022-06-21 LAB — CBC WITH DIFFERENTIAL (CANCER CENTER ONLY)
Abs Immature Granulocytes: 0.08 10*3/uL — ABNORMAL HIGH (ref 0.00–0.07)
Basophils Absolute: 0 10*3/uL (ref 0.0–0.1)
Basophils Relative: 0 %
Eosinophils Absolute: 0.1 10*3/uL (ref 0.0–0.5)
Eosinophils Relative: 1 %
HCT: 36.7 % — ABNORMAL LOW (ref 39.0–52.0)
Hemoglobin: 12 g/dL — ABNORMAL LOW (ref 13.0–17.0)
Immature Granulocytes: 1 %
Lymphocytes Relative: 5 %
Lymphs Abs: 0.5 10*3/uL — ABNORMAL LOW (ref 0.7–4.0)
MCH: 32.5 pg (ref 26.0–34.0)
MCHC: 32.7 g/dL (ref 30.0–36.0)
MCV: 99.5 fL (ref 80.0–100.0)
Monocytes Absolute: 0.5 10*3/uL (ref 0.1–1.0)
Monocytes Relative: 5 %
Neutro Abs: 9.1 10*3/uL — ABNORMAL HIGH (ref 1.7–7.7)
Neutrophils Relative %: 88 %
Platelet Count: 178 10*3/uL (ref 150–400)
RBC: 3.69 MIL/uL — ABNORMAL LOW (ref 4.22–5.81)
RDW: 13.2 % (ref 11.5–15.5)
WBC Count: 10.3 10*3/uL (ref 4.0–10.5)
nRBC: 0 % (ref 0.0–0.2)

## 2022-06-21 LAB — CMP (CANCER CENTER ONLY)
ALT: 19 U/L (ref 0–44)
AST: 23 U/L (ref 15–41)
Albumin: 4.1 g/dL (ref 3.5–5.0)
Alkaline Phosphatase: 73 U/L (ref 38–126)
Anion gap: 7 (ref 5–15)
BUN: 14 mg/dL (ref 8–23)
CO2: 34 mmol/L — ABNORMAL HIGH (ref 22–32)
Calcium: 9.9 mg/dL (ref 8.9–10.3)
Chloride: 101 mmol/L (ref 98–111)
Creatinine: 1.06 mg/dL (ref 0.61–1.24)
GFR, Estimated: >60 mL/min (ref >60)
Glucose, Bld: 114 mg/dL — ABNORMAL HIGH (ref 70–99)
Potassium: 5.2 mmol/L — ABNORMAL HIGH (ref 3.5–5.1)
Sodium: 142 mmol/L (ref 135–145)
Total Bilirubin: 0.5 mg/dL (ref 0.3–1.2)
Total Protein: 6.9 g/dL (ref 6.5–8.1)

## 2022-06-21 MED ORDER — IOHEXOL 300 MG/ML  SOLN
75.0000 mL | Freq: Once | INTRAMUSCULAR | Status: AC | PRN
  Administered 2022-06-21: 75 mL via INTRAVENOUS

## 2022-06-21 MED ORDER — SODIUM CHLORIDE (PF) 0.9 % IJ SOLN
INTRAMUSCULAR | Status: AC
  Filled 2022-06-21: qty 50

## 2022-06-24 ENCOUNTER — Inpatient Hospital Stay (HOSPITAL_BASED_OUTPATIENT_CLINIC_OR_DEPARTMENT_OTHER): Payer: Medicare Other | Admitting: Internal Medicine

## 2022-06-24 DIAGNOSIS — C3431 Malignant neoplasm of lower lobe, right bronchus or lung: Secondary | ICD-10-CM

## 2022-06-24 NOTE — Progress Notes (Signed)
[place] [phone]   Fax:([phone]) [place], [address], [place] [address]  I connected withNAME@ on 06/24/22 at  1:30 PM EST by video enabled telemedicine visit and verified that I am speaking with the correct person using two identifiers.   I discussed the limitations, risks, security and privacy concerns of performing an evaluation and management service by telemedicine and the availability of in-person appointments. I also discussed with the patient that there may be a patient responsible charge related to this service. The patient expressed understanding and agreed to proceed.  Other persons participating in the visit and their role in the encounter:  None  Patient's location: Home Provider's location: [facility] [place]  DIAGNOSIS:  limited stage (T1a, N0, M0) small cell lung cancer presented with 0.9 cm superior segment right lower lobe pulmonary nodule diagnosed in February 2023.   PRIOR THERAPY: SBRT to the right lower lobe lung nodule under the care of Dr. Lisbeth Renshaw   CURRENT THERAPY: Observation  INTERVAL HISTORY: Lesia Sago II 76 y.o. male has a MyChart visual video visit with me today for evaluation and discussion of his scan results.  The patient could not come to the clinic today because he has flulike symptoms and was concerned about possibility of COVID.  He has chest congestion with mild cough but no significant chest pain or hemoptysis.  He has baseline shortness of breath and he is on home oxygen.  He has some chills but no fever.  He denied having any significant nausea, vomiting, diarrhea or constipation.  He has no weight loss or night sweats.  He had repeat CT scan of the chest performed recently and we are having the visit for discussion of his scan results and recommendation regarding treatment of his condition.  MEDICAL HISTORY: Past Medical History:   Diagnosis Date   Chronic back pain    "mid and lower" (04/07/2018)   Chronic rhinitis    -Sinus Ct 08/01/2009 >> Bilateral maxillary sinusitis with some mucosal thickeningin the sphenoid and frontal sinuses as well with air fluid levels present -chronic rhinitis flyer Aug 04, 2009   Compressed spine fracture [facility])    COPD (chronic obstructive pulmonary disease) (HCC)    PFT's rec Jul 17, 2009   Dyspnea    Emphysema lung ([place])    On home oxygen therapy    "3L; 24/7" (04/07/2018)   Onychomycosis    Dr. Judi Cong   Orthostatic hypotension    "since 10/2017" (04/07/2018)   PAD (peripheral artery disease) ([place])    Pneumonia    "twice in 1 year" (04/07/2018)   Pulmonary embolism ([place]) 04/07/2018   Skin cancer    "lips, face, ears, arms" (04/07/2018)   Small cell lung cancer, right ([place]) 2023   Dr. Lisbeth Renshaw.  rad   Vertigo    "since ~ 02/2018" (04/07/2018)    ALLERGIES:  is allergic to tape, amoxicillin-pot clavulanate, ciprofloxacin, levaquin [levofloxacin], and lipitor [atorvastatin].  MEDICATIONS:  Current Outpatient Medications  Medication Sig Dispense Refill   acetaminophen (TYLENOL) 500 MG tablet Take 1,000 mg by mouth every 6 (six) hours as needed for moderate pain.      albuterol (VENTOLIN HFA) 108 (90 Base) MCG/ACT inhaler USE 2 INHALATIONS BY MOUTH  EVERY 6 HOURS AS NEEDED FOR WHEEZING 72 g 3   azithromycin (ZITHROMAX) 250 MG tablet Take 250 mg by mouth 3 (three) times a week.  Calcium Carbonate-Vitamin D (CALCIUM 600+D PO) Take 2 tablets by mouth daily.     doxycycline (VIBRA-TABS) 100 MG tablet Take 1 tablet (100 mg total) by mouth 2 (two) times daily. 10 tablet 0   ELIQUIS 2.5 MG TABS tablet TAKE 1 TABLET BY MOUTH TWICE  DAILY 180 tablet 3   Ferrous Sulfate (IRON) 325 (65 Fe) MG TABS Take 325 mg by mouth every other day.     fluticasone (FLONASE) 50 MCG/ACT nasal spray USE 2 SPRAYS IN BOTH  NOSTRILS DAILY 48 g 3   folic acid (FOLVITE) 1 MG tablet Take 1 tablet (1 mg total) by  mouth daily.     furosemide (LASIX) 20 MG tablet TAKE 1 TABLET BY MOUTH  DAILY AS NEEDED FOR FLUID  OR EDEMA 90 tablet 3   hydrOXYzine (VISTARIL) 25 MG capsule Take 1 capsule (25 mg total) by mouth every 8 (eight) hours as needed. 30 capsule 0   magic mouthwash (nystatin, lidocaine, diphenhydrAMINE, alum & mag hydroxide) suspension Swish and spit 5 mLs 4 (four) times daily. 180 mL 1   magic mouthwash SOLN Take 5 mLs by mouth 3 (three) times daily as needed for mouth pain. 150 mL 0   Multiple Vitamin (MULTIVITAMIN WITH MINERALS) TABS tablet Take 1 tablet by mouth daily.     mupirocin cream (BACTROBAN) 2 % Apply 1 application topically 2 (two) times daily. (Patient taking differently: Apply 1 application  topically daily as needed (Cyst).) 22 g 0   oxyCODONE (OXY IR/ROXICODONE) 5 MG immediate release tablet Take 1 tablet (5 mg total) by mouth every 4 (four) hours as needed for severe pain. 60 tablet 0   oxycodone (OXY-IR) 5 MG capsule Take 1 capsule (5 mg total) by mouth every 6 (six) hours as needed (arm pain after fall- particularly with dressing changes due to sticking issue). 20 capsule 0   OXYGEN Inhale 4-5 L into the lungs continuous.      pantoprazole (PROTONIX) 20 MG tablet TAKE 1 TABLET BY MOUTH  DAILY 90 tablet 3   Polyethyl Glycol-Propyl Glycol (SYSTANE) 0.4-0.3 % SOLN Place 1 drop into both eyes every other day.     predniSONE (DELTASONE) 10 MG tablet Take 1 tablet (10 mg total) by mouth daily with breakfast. 90 tablet 3   Respiratory Therapy Supplies (FLUTTER) DEVI 10 times Twice a day and prn as needed, may increase if feeling worse 1 each 0   roflumilast (DALIRESP) 500 MCG TABS tablet TAKE 1 TABLET BY MOUTH  DAILY 90 tablet 3   rosuvastatin (CRESTOR) 10 MG tablet TAKE 1 TABLET BY MOUTH DAILY 90 tablet 3   SPIRIVA RESPIMAT 2.5 MCG/ACT AERS USE 2 INHALATIONS BY MOUTH  DAILY 12 g 3   SYMBICORT 160-4.5 MCG/ACT inhaler USE 2 INHALATIONS BY MOUTH  TWICE DAILY 30.6 g 3   triamcinolone cream  (KENALOG) 0.1 % Apply 1 application. topically 2 (two) times daily. For 7-10 days maximum 80 g 0   No current facility-administered medications for this visit.    SURGICAL HISTORY:  Past Surgical History:  Procedure Laterality Date   ABDOMINAL AORTOGRAM W/LOWER EXTREMITY N/A 01/21/2020   Procedure: ABDOMINAL AORTOGRAM W/LOWER EXTREMITY;  Surgeon: Elam Dutch, MD;  Location: [place] [facility];  Service: Cardiovascular;  Laterality: N/A;   APPENDECTOMY     BIOPSY  02/20/2020   Procedure: BIOPSY;  Surgeon: Rush Landmark Telford Nab., MD;  Location: [place];  Service: Gastroenterology;;   BRONCHIAL BIOPSY  09/04/2021   Procedure: BRONCHIAL BIOPSIES;  Surgeon: Garner Nash, DO;  Location: [place] [facility];  Service: Pulmonary;;   BRONCHIAL BRUSHINGS  09/04/2021   Procedure: BRONCHIAL BRUSHINGS;  Surgeon: Garner Nash, DO;  Location: [place];  Service: Pulmonary;;   BRONCHIAL NEEDLE ASPIRATION BIOPSY  09/04/2021   Procedure: BRONCHIAL NEEDLE ASPIRATION BIOPSIES;  Surgeon: Garner Nash, DO;  Location: [place];  Service: Pulmonary;;   CATARACT EXTRACTION, BILATERAL     ENDARTERECTOMY FEMORAL Right 04/19/2019   Procedure: ENDARTERECTOMY RIGHT COMMON FEMORAL;  Surgeon: Elam Dutch, MD;  Location[facility];  Service: Vascular;  Laterality: Right;   ENDARTERECTOMY FEMORAL Left 01/24/2020   Procedure: LEFT FEMORAL ENDARTERECTOMY WITH DACRON PATCH ANGIOPLASTY;  Surgeon: Elam Dutch, MD;  Location: [facility];  Service: Vascular;  Laterality: Left;   ESOPHAGOGASTRODUODENOSCOPY (EGD) WITH PROPOFOL N/A 02/20/2020   Procedure: ESOPHAGOGASTRODUODENOSCOPY (EGD) WITH PROPOFOL;  Surgeon: Irving Copas., MD;  Location: [place];  Service: Gastroenterology;  Laterality: N/A;   FEMORAL ENDARTERECTOMY Left 01/24/2020   FIDUCIAL MARKER PLACEMENT  09/04/2021   Procedure: FIDUCIAL MARKER PLACEMENT;  Surgeon: Garner Nash, DO;  Location: [place];  Service: Pulmonary;;    HEMOSTASIS CLIP PLACEMENT  02/20/2020   Procedure: HEMOSTASIS CLIP PLACEMENT;  Surgeon: Irving Copas., MD;  Location: [facility];  Service: Gastroenterology;;   HOT HEMOSTASIS N/A 02/20/2020   Procedure: HOT HEMOSTASIS (ARGON PLASMA COAGULATION/BICAP);  Surgeon: Irving Copas., MD;  Location: [place];  Service: Gastroenterology;  Laterality: N/A;   INSERTION OF ILIAC STENT Left 01/24/2020   Procedure: INSERTION OF VBX STENT 8X59 AND 8X39 LEFT COMMON ILIAC ARTERY. INSERTION OF INNOVA 7 X 60 INNOVA STENT LEFT EXTERNAL ILIAC ARTERY. ;  Surgeon: Elam Dutch, MD;  Location: [place];  Service: Vascular;  Laterality: Left;   LOWER EXTREMITY ANGIOGRAPHY  12/11/2018   LOWER EXTREMITY ANGIOGRAPHY N/A 12/11/2018   Procedure: LOWER EXTREMITY ANGIOGRAPHY;  Surgeon: Elam Dutch, MD;  Location: [place] [facility];  Service: Cardiovascular;  Laterality: N/A;   PATCH ANGIOPLASTY Right 04/19/2019   Procedure: Patch Angioplasty;  Surgeon: Elam Dutch, MD;  Location[facility];  Service: Vascular;  Laterality: Right;   PERIPHERAL VASCULAR INTERVENTION Right 12/11/2018   Procedure: PERIPHERAL VASCULAR INTERVENTION;  Surgeon: Elam Dutch, MD;  Location: [place] [facility];  Service: Cardiovascular;  Laterality: Right;  Common Iliac    SKIN CANCER EXCISION     "lips, face, ears, arms" (04/07/2018)   TONSILLECTOMY     ULTRASOUND GUIDANCE FOR VASCULAR ACCESS Right 01/24/2020   Procedure: ULTRASOUND GUIDANCE FOR VASCULAR ACCESS;  Surgeon: Elam Dutch, MD;  Location: [place] [facility];  Service: Vascular;  Laterality: Right;   VIDEO BRONCHOSCOPY WITH RADIAL ENDOBRONCHIAL ULTRASOUND  09/04/2021   Procedure: VIDEO BRONCHOSCOPY WITH RADIAL ENDOBRONCHIAL ULTRASOUND;  Surgeon: Garner Nash, DO;  Location: [facility];  Service: Pulmonary;;    REVIEW OF SYSTEMS:  A comprehensive review of systems was negative except for: Constitutional: positive for chills and fatigue Ears, nose, mouth,  throat, and face: positive for nasal congestion Respiratory: positive for cough and dyspnea on exertion     LABORATORY DATA: Lab Results  Component Value Date   WBC 10.3 06/21/2022   HGB 12.0 (L) 06/21/2022   HCT 36.7 (L) 06/21/2022   MCV 99.5 06/21/2022   PLT 178 06/21/2022      Chemistry      Component Value Date/Time   NA 142 06/21/2022 1432   NA 140 03/02/2020 0000   K 5.2 (H) 06/21/2022 1432  CL 101 06/21/2022 1432   CO2 34 (H) 06/21/2022 1432   BUN 14 06/21/2022 1432   BUN 14 03/02/2020 0000   CREATININE 1.06 06/21/2022 1432   CREATININE 1.14 07/06/2020 0941   GLU 90 03/02/2020 0000      Component Value Date/Time   CALCIUM 9.9 06/21/2022 1432   ALKPHOS 73 06/21/2022 1432   AST 23 06/21/2022 1432   ALT 19 06/21/2022 1432   BILITOT 0.5 06/21/2022 1432       RADIOGRAPHIC STUDIES: CT Chest W Contrast  Result Date: 06/24/2022 CLINICAL DATA:  Limited stage right small cell lung cancer status post radiation therapy completed 10/23/2021. Restaging. * Tracking Code: BO * EXAM: CT CHEST WITH CONTRAST TECHNIQUE: Multidetector CT imaging of the chest was performed during intravenous contrast administration. RADIATION DOSE REDUCTION: This exam was performed according to the departmental dose-optimization program which includes automated exposure control, adjustment of the mA and/or kV according to patient size and/or use of iterative reconstruction technique. CONTRAST:  19mL OMNIPAQUE IOHEXOL 300 MG/ML  SOLN COMPARISON:  12/13/2021 chest CT. FINDINGS: Cardiovascular: Normal heart size. No significant pericardial effusion/thickening. Three-vessel coronary atherosclerosis. Atherosclerotic nonaneurysmal thoracic aorta. Normal caliber pulmonary arteries. No central pulmonary emboli. Mediastinum/Nodes: No significant thyroid nodules. Unremarkable esophagus. No pathologically enlarged axillary, mediastinal or hilar lymph nodes. Lungs/Pleura: No pneumothorax. No pleural effusion. Severe  centrilobular emphysema with diffuse bronchial wall thickening. Chronic thick bandlike consolidation in the apical right upper lobe with associated bronchiectasis, volume loss and distortion, unchanged, compatible with postinfectious scarring. Somewhat sharply marginated patchy low right perihilar lung consolidation with some associated volume loss and distortion, compatible with evolving postradiation change. No evidence of a residual or recurrent pulmonary nodule in the right lower lobe at the treatment site. No new significant pulmonary nodules. Upper abdomen: Right adrenal 1.1 cm nodule with density 38 HU, stable since 08/08/2021 PET-CT where it was characterized as a benign adenoma. Cystic 1.9 cm anterior pancreatic tail lesion (series 2/image 155), stable. Musculoskeletal: No aggressive appearing focal osseous lesions. Chronic moderate to severe T6, T7, T8 and T9 vertebral compression fractures. Moderate thoracic spondylosis. Subcutaneous 3.4 cm simple cystic lesion in the upper back just to the left of midline, compatible with a sebaceous cyst, stable. IMPRESSION: 1. No evidence of local tumor recurrence in the right lower lobe at the treatment site. Expected evolving postradiation change in the low perihilar right lung. 2. No evidence of metastatic disease in the chest. 3. Cystic 1.9 cm anterior pancreatic tail lesion, stable. MRI abdomen without and with IV contrast may be obtained for further characterization as clinically warranted. 4. Chronic moderate to severe T6, T7, T8 and T9 vertebral compression fractures. 5. Stable right adrenal adenoma, for which no follow-up imaging is recommended. 6. Three-vessel coronary atherosclerosis. 7. Aortic Atherosclerosis (ICD10-I70.0) and Emphysema (ICD10-J43.9). Electronically Signed   By: Ilona Sorrel M.D.   On: 06/24/2022 09:02    ASSESSMENT AND PLAN: This is a very pleasant 76 years old white male diagnosed with limited stage (T1 a, N0, M0) small cell lung cancer  presented with 0.9 cm superior segment right lower lobe pulmonary nodule diagnosed in February 2023 status post SBRT to the right lower lobe lung nodule  The patient is currently on observation and he is feeling fine with no concerning complaints except for the baseline shortness of breath, chest congestion and nasal congestion from a cold symptoms recently. He had repeat CT scan of the chest performed recently.  I personally and independently reviewed the scans and discussed  the result with the patient today. His scan showed no evidence for local tumor recurrence or evidence of metastatic disease with the expected postradiation changes.  He had stable cystic 1.9 cm anterior pancreatic tail lesion and recommendation from radiology is to consider MRI of the abdomen with and without contrast for further evaluation.  I will arrange for the patient to have the MRI of the abdomen performed on the same day of the MRI of the brain in 1 months. If there is no concerning finding on the MRI of the abdomen, I will see him back for follow-up visit in 6 months for evaluation and repeat CT scan of the chest for restaging of his disease. The patient was advised to call immediately if he has any other concerning symptoms in the interval. I discussed the assessment and treatment plan with the patient. The patient was provided an opportunity to ask questions and all were answered. The patient agreed with the plan and demonstrated an understanding of the instructions.   The patient was advised to call back or seek an in-person evaluation if the symptoms worsen or if the condition fails to improve as anticipated.  I provided 15 minutes of face-to-face video visit time during this encounter, and > 50% was spent counseling as documented under my assessment & plan.  Eilleen Kempf, MD 06/24/2022 1:27 PM  Disclaimer: This note was dictated with voice recognition software. Similar sounding words can inadvertently be  transcribed and may not be corrected upon review.

## 2022-07-09 ENCOUNTER — Encounter: Payer: Self-pay | Admitting: Radiation Oncology

## 2022-07-25 ENCOUNTER — Ambulatory Visit (HOSPITAL_COMMUNITY)
Admission: RE | Admit: 2022-07-25 | Discharge: 2022-07-25 | Disposition: A | Discharge status: Home / Self Care | Payer: Medicare Other | Source: Ambulatory Visit | Attending: Radiation Oncology | Admitting: Radiation Oncology

## 2022-07-25 DIAGNOSIS — C3490 Malignant neoplasm of unspecified part of unspecified bronchus or lung: Secondary | ICD-10-CM | POA: Diagnosis present

## 2022-07-25 DIAGNOSIS — C3431 Malignant neoplasm of lower lobe, right bronchus or lung: Secondary | ICD-10-CM

## 2022-07-25 MED ORDER — GADOBUTROL 1 MMOL/ML IV SOLN
8.5000 mL | Freq: Once | INTRAVENOUS | Status: AC | PRN
  Administered 2022-07-25: 8.5 mL via INTRAVENOUS

## 2022-07-27 ENCOUNTER — Ambulatory Visit (HOSPITAL_COMMUNITY)
Admission: RE | Admit: 2022-07-27 | Discharge: 2022-07-27 | Disposition: A | Discharge status: Home / Self Care | Payer: Medicare Other | Source: Ambulatory Visit | Attending: Internal Medicine | Admitting: Internal Medicine

## 2022-07-27 ENCOUNTER — Other Ambulatory Visit: Payer: Self-pay | Admitting: Internal Medicine

## 2022-07-27 DIAGNOSIS — C3431 Malignant neoplasm of lower lobe, right bronchus or lung: Secondary | ICD-10-CM | POA: Insufficient documentation

## 2022-07-27 MED ORDER — GADOBUTROL 1 MMOL/ML IV SOLN
9.0000 mL | Freq: Once | INTRAVENOUS | Status: AC | PRN
  Administered 2022-07-27: 9 mL via INTRAVENOUS

## 2022-07-28 ENCOUNTER — Encounter: Payer: Self-pay | Admitting: Family Medicine

## 2022-07-29 ENCOUNTER — Encounter: Payer: Self-pay | Admitting: Radiation Oncology

## 2022-07-29 ENCOUNTER — Ambulatory Visit
Admission: RE | Admit: 2022-07-29 | Discharge: 2022-07-29 | Disposition: A | Discharge status: Home / Self Care | Payer: Medicare Other | Source: Ambulatory Visit | Attending: Radiation Oncology | Admitting: Radiation Oncology

## 2022-07-29 ENCOUNTER — Other Ambulatory Visit: Payer: Self-pay | Admitting: Internal Medicine

## 2022-07-29 ENCOUNTER — Other Ambulatory Visit: Payer: Self-pay | Admitting: Radiation Therapy

## 2022-07-29 DIAGNOSIS — C3431 Malignant neoplasm of lower lobe, right bronchus or lung: Secondary | ICD-10-CM

## 2022-07-29 DIAGNOSIS — K869 Disease of pancreas, unspecified: Secondary | ICD-10-CM

## 2022-07-29 DIAGNOSIS — C3490 Malignant neoplasm of unspecified part of unspecified bronchus or lung: Secondary | ICD-10-CM

## 2022-07-29 NOTE — Progress Notes (Signed)
Telephone nursing interview for patient to receive most recent MRI results from 07/25/22.  I verified patient's identity and began nursing interview. Patient reports doing well. No related issues reported at this time.  Meaningful use complete.  Patient aware of his 2:00pm-07/29/2022 telephone appointment w/ Laurence Aly PA-C. I left my extension [phone] in case patient needs anything. Patient verbalized understanding.  This concludes the interview.   Ruel Favors, LPN

## 2022-07-29 NOTE — Telephone Encounter (Signed)
Patient is sch for vv for  1/23 at 4 pm

## 2022-07-29 NOTE — Progress Notes (Signed)
Radiation Oncology         (336) [phone] ________________________________  Outpatient Follow Up- Conducted via telephone at patient request.  I spoke with the patient to conduct this visit via telephone. The patient was notified in advance and was offered an in person or telemedicine meeting to allow for face to face communication but instead preferred to proceed with a telephone visit.    Name: DEDDRICK [name]        MRN: [id number]  Date of Service: 07/29/2022 DOB: 1945-10-18  YP:[id number], Aldine Contes, MD  Josephine Igo, DO     REFERRING PHYSICIAN: Josephine Igo, DO   DIAGNOSIS: The encounter diagnosis was Malignant neoplasm of lower lobe of right lung (HCC).   HISTORY OF PRESENT ILLNESS: Francisco [name] is a 77 y.o. 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  In January 2023 a RLL nodule was noted by CT. On 08/08/2021 he underwent PET imaging that showed a solitary hypermetabolic pulmonary nodule in the right lower lobe with intense hypermetabolism with an SUV up to 7.7, the lesion measured 13 mm.  No evidence of metastatic disease was identified.  He underwent bronchoscopy on 09/04/2021 and cytology showed small cell carcinoma. He received stereotactic body radiotherapy (SBRT) to the RLL and is followed in surveillance with Dr. Arbutus Ped. He declined prophylactic cranial irradiation, but desired close surveillance with MRI imaging.   Since his treatment to the RLL he has been without new or progressive disease. He has atherosclerotic changes, cirrhotic changes, and a lesion in the pancreas that has been stable, and compression changes in the thoracic spine were noted. He has been followed by [name] for this.   His brain has also remained without disease.  He has preferred to have scans at 40-month intervals since our last discussion and his most recent scan on 07/25/2022 did not show evidence of any active disease in the brain.  He has bilateral mastoid  effusions which are stable and right sphenoid sinus thickening.  Changes of the white matter were felt to be typical signal changes from chronic small vessel disease.  He is contacted today to review these results.  Apparently he experienced hypersensitivity symptoms with itching and feeling as though the roof of his mouth was raw after his second scan because he had an MRI of the abdomen to follow-up on a pancreatic cyst 2 days after his brain MRI.  He states his symptoms have improved.  He is concerned about being able to tolerate this in the future.  PREVIOUS RADIATION THERAPY: Yes   10/16/21-10/23/21 SBRT Treatment The tumor in the RLL was treated with a course of stereotactic body radiation treatment. The patient received 54 Gy In 3 fractions at 18 Gy per fraction.   PAST MEDICAL HISTORY:  Past Medical History:  Diagnosis Date   Chronic back pain    "mid and lower" (04/07/2018)   Chronic rhinitis    -Sinus Ct 08/01/2009 >> Bilateral maxillary sinusitis with some mucosal thickeningin the sphenoid and frontal sinuses as well with air fluid levels present -chronic rhinitis flyer Aug 04, 2009   Compressed spine fracture [facility])    COPD (chronic obstructive pulmonary disease) (HCC)    PFT's rec Jul 17, 2009   Dyspnea    Emphysema lung (HCC)    On home oxygen therapy    "3L; 24/7" (04/07/2018)   Onychomycosis    Dr. Melvyn Novas   Orthostatic hypotension    "since  10/2017" (04/07/2018)   PAD (peripheral artery disease) (HCC)    Pneumonia    "twice in 1 year" (04/07/2018)   Pulmonary embolism (HCC) 04/07/2018   Skin cancer    "lips, face, ears, arms" (04/07/2018)   Small cell lung cancer, right (HCC) 2023   Dr. Mitzi Hansen.  rad   Vertigo    "since ~ 02/2018" (04/07/2018)       PAST SURGICAL HISTORY: Past Surgical History:  Procedure Laterality Date   ABDOMINAL AORTOGRAM W/LOWER EXTREMITY N/A 01/21/2020   Procedure: ABDOMINAL AORTOGRAM W/LOWER EXTREMITY;  Surgeon: Sherren Kerns, MD;  Location: [facility]  INVASIVE CV LAB;  Service: Cardiovascular;  Laterality: N/A;   APPENDECTOMY     BIOPSY  02/20/2020   Procedure: BIOPSY;  Surgeon: Meridee Score Netty Starring., MD;  Location: [facility] ENDOSCOPY;  Service: Gastroenterology;;   BRONCHIAL BIOPSY  09/04/2021   Procedure: BRONCHIAL BIOPSIES;  Surgeon: Josephine Igo, DO;  Location: [facility] ENDOSCOPY;  Service: Pulmonary;;   BRONCHIAL BRUSHINGS  09/04/2021   Procedure: BRONCHIAL BRUSHINGS;  Surgeon: Josephine Igo, DO;  Location: [facility] ENDOSCOPY;  Service: Pulmonary;;   BRONCHIAL NEEDLE ASPIRATION BIOPSY  09/04/2021   Procedure: BRONCHIAL NEEDLE ASPIRATION BIOPSIES;  Surgeon: Josephine Igo, DO;  Location: [facility] ENDOSCOPY;  Service: Pulmonary;;   CATARACT EXTRACTION, BILATERAL     ENDARTERECTOMY FEMORAL Right 04/19/2019   Procedure: ENDARTERECTOMY RIGHT COMMON FEMORAL;  Surgeon: Sherren Kerns, MD;  Location[facility] OR;  Service: Vascular;  Laterality: Right;   ENDARTERECTOMY FEMORAL Left 01/24/2020   Procedure: LEFT FEMORAL ENDARTERECTOMY WITH DACRON PATCH ANGIOPLASTY;  Surgeon: Sherren Kerns, MD;  Location: [facility] OR;  Service: Vascular;  Laterality: Left;   ESOPHAGOGASTRODUODENOSCOPY (EGD) WITH PROPOFOL N/A 02/20/2020   Procedure: ESOPHAGOGASTRODUODENOSCOPY (EGD) WITH PROPOFOL;  Surgeon: Lemar Lofty., MD;  Location: [facility] ENDOSCOPY;  Service: Gastroenterology;  Laterality: N/A;   FEMORAL ENDARTERECTOMY Left 01/24/2020   FIDUCIAL MARKER PLACEMENT  09/04/2021   Procedure: FIDUCIAL MARKER PLACEMENT;  Surgeon: Josephine Igo, DO;  Location: [facility] ENDOSCOPY;  Service: Pulmonary;;   HEMOSTASIS CLIP PLACEMENT  02/20/2020   Procedure: HEMOSTASIS CLIP PLACEMENT;  Surgeon: Lemar Lofty., MD;  Location: [facility] ENDOSCOPY;  Service: Gastroenterology;;   HOT HEMOSTASIS N/A 02/20/2020   Procedure: HOT HEMOSTASIS (ARGON PLASMA COAGULATION/BICAP);  Surgeon: Lemar Lofty., MD;  Location: [facility] ENDOSCOPY;  Service: Gastroenterology;  Laterality: N/A;   INSERTION OF  ILIAC STENT Left 01/24/2020   Procedure: INSERTION OF VBX STENT 8X59 AND 8X39 LEFT COMMON ILIAC ARTERY. INSERTION OF INNOVA 7 X 60 INNOVA STENT LEFT EXTERNAL ILIAC ARTERY. ;  Surgeon: Sherren Kerns, MD;  Location: [facility] OR;  Service: Vascular;  Laterality: Left;   LOWER EXTREMITY ANGIOGRAPHY  12/11/2018   LOWER EXTREMITY ANGIOGRAPHY N/A 12/11/2018   Procedure: LOWER EXTREMITY ANGIOGRAPHY;  Surgeon: Sherren Kerns, MD;  Location: [facility] INVASIVE CV LAB;  Service: Cardiovascular;  Laterality: N/A;   PATCH ANGIOPLASTY Right 04/19/2019   Procedure: Patch Angioplasty;  Surgeon: Sherren Kerns, MD;  Location: [facility] OR;  Service: Vascular;  Laterality: Right;   PERIPHERAL VASCULAR INTERVENTION Right 12/11/2018   Procedure: PERIPHERAL VASCULAR INTERVENTION;  Surgeon: Sherren Kerns, MD;  Location: [facility] INVASIVE CV LAB;  Service: Cardiovascular;  Laterality: Right;  Common Iliac    SKIN CANCER EXCISION     "lips, face, ears, arms" (04/07/2018)   TONSILLECTOMY     ULTRASOUND GUIDANCE FOR VASCULAR ACCESS Right 01/24/2020   Procedure: ULTRASOUND GUIDANCE FOR VASCULAR ACCESS;  Surgeon: Sherren Kerns, MD;  Location:  [facility] OR;  Service: Vascular;  Laterality: Right;   VIDEO BRONCHOSCOPY WITH RADIAL ENDOBRONCHIAL ULTRASOUND  09/04/2021   Procedure: VIDEO BRONCHOSCOPY WITH RADIAL ENDOBRONCHIAL ULTRASOUND;  Surgeon: Josephine Igo, DO;  Location: [facility] ENDOSCOPY;  Service: Pulmonary;;     FAMILY HISTORY:  Family History  Problem Relation Age of Onset   Heart disease Mother        CABG in her 109s, nonsmoker   Cancer Mother    Stroke Father    Heart disease Father        Died of MI at age 80, smoker   Hepatitis Sister    Coronary artery disease Other        male 1st degree relative <60   Colon cancer Neg Hx    Pancreatic cancer Neg Hx    Esophageal cancer Neg Hx    Inflammatory bowel disease Neg Hx    Liver disease Neg Hx    Rectal cancer Neg Hx    Stomach cancer Neg Hx      SOCIAL HISTORY:  reports  that he quit smoking about 4 years ago. His smoking use included pipe and cigarettes. He started smoking about 56 years ago. He has a 104.00 pack-year smoking history. He has never used smokeless tobacco. He reports current alcohol use of about 35.0 standard drinks of alcohol per week. He reports that he does not use drugs. The patient is married and lives in [place].    ALLERGIES: Tape, Amoxicillin-pot clavulanate, Ciprofloxacin, Gadavist [gadobutrol], Levaquin [levofloxacin], and Lipitor [atorvastatin]   MEDICATIONS:  Current Outpatient Medications  Medication Sig Dispense Refill   acetaminophen (TYLENOL) 500 MG tablet Take 1,000 mg by mouth every 6 (six) hours as needed for moderate pain.      albuterol (VENTOLIN HFA) 108 (90 Base) MCG/ACT inhaler USE 2 INHALATIONS BY MOUTH  EVERY 6 HOURS AS NEEDED FOR WHEEZING 72 g 3   azithromycin (ZITHROMAX) 250 MG tablet Take 250 mg by mouth 3 (three) times a week.     Calcium Carbonate-Vitamin D (CALCIUM 600+D PO) Take 2 tablets by mouth daily.     doxycycline (VIBRA-TABS) 100 MG tablet Take 1 tablet (100 mg total) by mouth 2 (two) times daily. 10 tablet 0   ELIQUIS 2.5 MG TABS tablet TAKE 1 TABLET BY MOUTH TWICE  DAILY 180 tablet 3   Ferrous Sulfate (IRON) 325 (65 Fe) MG TABS Take 325 mg by mouth every other day.     fluticasone (FLONASE) 50 MCG/ACT nasal spray USE 2 SPRAYS IN BOTH  NOSTRILS DAILY 48 g 3   folic acid (FOLVITE) 1 MG tablet Take 1 tablet (1 mg total) by mouth daily.     furosemide (LASIX) 20 MG tablet TAKE 1 TABLET BY MOUTH  DAILY AS NEEDED FOR FLUID  OR EDEMA 90 tablet 3   hydrOXYzine (VISTARIL) 25 MG capsule Take 1 capsule (25 mg total) by mouth every 8 (eight) hours as needed. 30 capsule 0   magic mouthwash (nystatin, lidocaine, diphenhydrAMINE, alum & mag hydroxide) suspension Swish and spit 5 mLs 4 (four) times daily. 180 mL 1   magic mouthwash SOLN Take 5 mLs by mouth 3 (three) times daily as needed for mouth pain. 150 mL 0    Multiple Vitamin (MULTIVITAMIN WITH MINERALS) TABS tablet Take 1 tablet by mouth daily.     mupirocin cream (BACTROBAN) 2 % Apply 1 application topically 2 (two) times daily. (Patient taking differently: Apply 1 application  topically daily as needed (Cyst).) 22 g 0  oxyCODONE (OXY IR/ROXICODONE) 5 MG immediate release tablet Take 1 tablet (5 mg total) by mouth every 4 (four) hours as needed for severe pain. 60 tablet 0   oxycodone (OXY-IR) 5 MG capsule Take 1 capsule (5 mg total) by mouth every 6 (six) hours as needed (arm pain after fall- particularly with dressing changes due to sticking issue). 20 capsule 0   OXYGEN Inhale 4-5 L into the lungs continuous.      pantoprazole (PROTONIX) 20 MG tablet TAKE 1 TABLET BY MOUTH  DAILY 90 tablet 3   Polyethyl Glycol-Propyl Glycol (SYSTANE) 0.4-0.3 % SOLN Place 1 drop into both eyes every other day.     predniSONE (DELTASONE) 10 MG tablet Take 1 tablet (10 mg total) by mouth daily with breakfast. 90 tablet 3   Respiratory Therapy Supplies (FLUTTER) DEVI 10 times Twice a day and prn as needed, may increase if feeling worse 1 each 0   roflumilast (DALIRESP) 500 MCG TABS tablet TAKE 1 TABLET BY MOUTH  DAILY 90 tablet 3   rosuvastatin (CRESTOR) 10 MG tablet TAKE 1 TABLET BY MOUTH DAILY 90 tablet 3   SPIRIVA RESPIMAT 2.5 MCG/ACT AERS USE 2 INHALATIONS BY MOUTH  DAILY 12 g 3   SYMBICORT 160-4.5 MCG/ACT inhaler USE 2 INHALATIONS BY MOUTH  TWICE DAILY 30.6 g 3   triamcinolone cream (KENALOG) 0.1 % Apply 1 application. topically 2 (two) times daily. For 7-10 days maximum 80 g 0   No current facility-administered medications for this encounter.     REVIEW OF SYSTEMS: On review of systems, the patient reports he has been doing well overall.  Outside of his symptoms of itching, he has not had any trouble with his breathing, swelling of his lips or tongue or nausea or vomiting with that episode.  He also denies or new symptoms of shortness of breath or chest pain  pain or difficulty ambulating.  No other complaints are verbalized.     PHYSICAL EXAM:  Unable to assess due to encounter type   ECOG = 1  0 - Asymptomatic (Fully active, able to carry on all predisease activities without restriction)  1 - Symptomatic but completely ambulatory (Restricted in physically strenuous activity but ambulatory and able to carry out work of a light or sedentary nature. For example, light housework, office work)  2 - Symptomatic, <50% in bed during the day (Ambulatory and capable of all self care but unable to carry out any work activities. Up and about more than 50% of waking hours)  3 - Symptomatic, >50% in bed, but not bedbound (Capable of only limited self-care, confined to bed or chair 50% or more of waking hours)  4 - Bedbound (Completely disabled. Cannot carry on any self-care. Totally confined to bed or chair)  5 - Death   Santiago Glad MM, Creech RH, Tormey DC, et al. [phone]). "Toxicity and response criteria of the [facility] Group". Am. Evlyn Clines. Oncol. 5 (6): 649-55    LABORATORY DATA:  Lab Results  Component Value Date   WBC 10.3 06/21/2022   HGB 12.0 (L) 06/21/2022   HCT 36.7 (L) 06/21/2022   MCV 99.5 06/21/2022   PLT 178 06/21/2022   Lab Results  Component Value Date   NA 142 06/21/2022   K 5.2 (H) 06/21/2022   CL 101 06/21/2022   CO2 34 (H) 06/21/2022   Lab Results  Component Value Date   ALT 19 06/21/2022   AST 23 06/21/2022   ALKPHOS 73 06/21/2022  BILITOT 0.5 06/21/2022      RADIOGRAPHY: MR Abdomen W Wo Contrast  Result Date: 07/28/2022 CLINICAL DATA:  Follow-up pancreatic cysts. EXAM: MRI ABDOMEN WITHOUT AND WITH CONTRAST TECHNIQUE: Multiplanar multisequence MR imaging of the abdomen was performed both before and after the administration of intravenous contrast. CONTRAST:  77mL GADAVIST GADOBUTROL 1 MMOL/ML IV SOLN COMPARISON:  CT abdomen pelvis December 11, 2018, PET-CT August 08, 2021 and chest CT June 21, 2022.  FINDINGS: Study is degraded by artifact related to motion and patient habitus, this lower sensitivity and specificity. Lower chest: No acute abnormality. Hepatobiliary: No suspicious hepatic lesion. Gallbladder is unremarkable. No biliary ductal dilation Pancreas: Large cystic lesion in the pancreatic tail measures 2.4 x 1.4 cm on image 22/3 without suspicious postcontrast enhancement or nodularity. Additional cystic lesions in the pancreatic tail body and head measure up to 9 mm on image 22/3 none of which demonstrate suspicious postcontrast enhancement or nodularity. No pancreatic ductal dilation. No evidence of acute inflammation. Spleen:  No splenomegaly. Adrenals/Urinary Tract: Well-circumscribed lobular T2 hyperintense right lower pole renal lesion measures 2.1 x 1.6 cm on image 24/3 the lesion demonstrates greater than 4 thin internal enhancing septations without nodularity or mural/septal thickening, compatible with a Bosniak classification 71f renal cyst. Bilateral subcentimeter fluid signal renal lesions are compatible with cysts and considered benign requiring no independent imaging follow-up. Stomach/Bowel: Visualized portions within the abdomen are unremarkable. Vascular/Lymphatic: Aortic atherosclerosis. Smooth IVC contours. The portal, splenic and superior mesenteric veins are patent. No pathologically enlarged abdominal lymph nodes. Other:  No significant abdominal free fluid. Musculoskeletal: Chronic L4 compression deformity. Chronic irregularity of the L3 inferior endplate. IMPRESSION: Examination is degraded by artifact related to patient motion and body habitus which lower sensitivity and specificity. Within this context: 1. Cystic 2.4 cm lesion in the pancreatic tail and additional smaller cystic pancreatic lesions measure up to 9 mm common no suspicious MRI features. Given the size of the 2.4 cm cystic lesion consider further evaluation with EUS/FNA versus follow-up MRCP with and without  contrast in 6 months. 2. Bosniak classification 2F right lower pole renal cyst measuring 2.1 cm. The large majority of Bosniak IIF masses are benign. When malignant, nearly all are indolent. Generally, Bosniak IIF masses are followed by imaging at 6 months and 12 months (MRI preferred over CT), then annually for a total of 5 years to assess for morphologic change. (Reference: Bosniak Classification of Cystic Renal Masses, Version 2019. Radiology 2019; [id number]): [phone].) Electronically Signed   By: Maudry Mayhew M.D.   On: 07/28/2022 09:42   MR Brain W Wo Contrast  Result Date: 07/26/2022 CLINICAL DATA:  Small cell lung cancer monitoring EXAM: MRI HEAD WITHOUT AND WITH CONTRAST TECHNIQUE: Multiplanar, multiecho pulse sequences of the brain and surrounding structures were obtained without and with intravenous contrast. CONTRAST:  8.61mL GADAVIST GADOBUTROL 1 MMOL/ML IV SOLN COMPARISON:  01/19/2022 FINDINGS: Brain: No enhancement or swelling to suggest metastatic disease. No incidental infarct, hemorrhage, hydrocephalus, or collection. Vascular: Normal flow voids. Skull and upper cervical spine: Negative for marrow lesion Sinuses/Orbits: No significant finding.  Negative for mass. IMPRESSION: Negative for metastatic disease to the brain. Electronically Signed   By: Tiburcio Pea M.D.   On: 07/26/2022 07:25       IMPRESSION/PLAN: 1. Limited Stage , [id number], Small cell carcinoma of the RLL.  The patient has been doing very well systemically and continues to follow with Dr. Arbutus Ped.  He will be seen again in June 2024 for his routine  follow-up.  We discussed the favorable MRI report and discussion was for repeat imaging.  The patient is still comfortable at 14-month intervals for having the scans but will call us sooner if he has questions or concerns that arise.  Regarding what sounds to be hypersensitivity to Gadavist contrast that he had on both occasions, I think we should premedicate him with prednisone and  Benadryl for his next scan.  I will communicate this to our team as we will be scheduling this in July for his next MRI of the brain.  The patient is in agreement with this plan 2. Hypersensitivity to Gadavist.  The patient is aware that we will send in a prescription for prednisone and guide Benadryl dosing as well leading up to his next scan. 3. History of osteopenia with thoracic spine compression fractures. He will follow with [name] regarding these changes.   This encounter was conducted via telephone.  The patient has provided two factor identification and has given verbal consent for this type of encounter and has been advised to only accept a meeting of this type in a secure network environment. The time spent during this encounter was 35 minutes including preparation, discussion, and coordination of the patient's care. The attendants for this meeting include  Ronny Bacon  and [name].  During the encounter,   Ronny Bacon were located at [facility] Radiation Oncology Department.  [name] was located at home.     Osker Mason, [facility]   **Disclaimer: This note was dictated with voice recognition software. Similar sounding words can inadvertently be transcribed and this note may contain transcription errors which may not have been corrected upon publication of note.**

## 2022-07-29 NOTE — Telephone Encounter (Signed)
See below regarding visit.

## 2022-07-30 ENCOUNTER — Encounter: Payer: Self-pay | Admitting: Family Medicine

## 2022-07-30 ENCOUNTER — Telehealth (INDEPENDENT_AMBULATORY_CARE_PROVIDER_SITE_OTHER): Payer: Medicare Other | Admitting: Family Medicine

## 2022-07-30 ENCOUNTER — Encounter: Payer: Self-pay | Admitting: Internal Medicine

## 2022-07-30 VITALS — BP 95/61 | HR 115 | Temp 98.2°F

## 2022-07-30 DIAGNOSIS — M545 Low back pain, unspecified: Secondary | ICD-10-CM

## 2022-07-30 DIAGNOSIS — W19XXXA Unspecified fall, initial encounter: Secondary | ICD-10-CM

## 2022-07-30 MED ORDER — DOXYCYCLINE HYCLATE 100 MG PO TABS: 100.0000 mg | ORAL_TABLET | Freq: Two times a day (BID) | ORAL | 0 refills | Status: DC

## 2022-07-30 NOTE — Progress Notes (Signed)
Phone [phone] Virtual visit via Video note   Subjective:  Chief complaint: Chief Complaint  Patient presents with   Medication Refill    Pt states he is having call to get medication, because he had fall and skined his arm     This visit type was conducted due to national recommendations for restrictions regarding the COVID-19 Pandemic (e.g. social distancing).  This format is felt to be most appropriate for this patient at this time balancing risks to patient and risks to population by having him in for in person visit.  No physical exam was performed (except for noted visual exam or audio findings with Telehealth visits).    Our team/I connected with Francisco [name] at  4:00 PM EST by a video enabled telemedicine application (doxy.me or caregility through epic) and verified that I am speaking with the correct person using two identifiers.  Location patient: [place] Location provider: [facility], office Persons participating in the virtual visit:  patient  Our team/I discussed the limitations of evaluation and management by telemedicine and the availability of in person appointments. In light of current covid-19 pandemic, patient also understands that we are trying to protect them by minimizing in office contact if at all possible.  The patient expressed consent for telemedicine visit and agreed to proceed. Patient understands insurance will be billed.   Past Medical History-  Patient Active Problem List   Diagnosis Date Noted   Malignant neoplasm of lower lobe of right lung (HCC)     Priority: High   Acute GI bleeding 02/18/2020    Priority: High   PAD (peripheral artery disease) (HCC) 12/11/2018    Priority: High   Stage 4 very severe COPD by GOLD classification (HCC) 11/27/2018    Priority: High   DNR (do not resuscitate) 04/21/2018    Priority: High   Chronic pulmonary embolism (HCC) 04/07/2018    Priority: High   Osteoporosis 11/19/2017    Priority: High    Thoracic compression fracture (HCC) 11/02/2017    Priority: High   Former smoker 08/23/2008    Priority: High   Iron deficiency anemia due to chronic blood loss 08/23/2020    Priority: Medium    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    Priority: Medium    History of upper GI bleeding 04/30/2020    Priority: Medium    Essential tremor 03/31/2018    Priority: Medium    BPPV (benign paroxysmal positional vertigo), right 02/10/2018    Priority: Medium    Essential hypertension 11/02/2017    Priority: Medium    BPH associated with nocturia 06/25/2017    Priority: Medium    Hyperglycemia 06/12/2016    Priority: Medium    Hyperlipidemia 07/22/2014    Priority: Medium    Pre-operative respiratory examination 12/10/2018    Priority: Low   Venous stasis dermatitis of both lower extremities 11/02/2017    Priority: Low   Chronic respiratory failure with hypoxia (HCC) 09/23/2017    Priority: Low   Leukocytosis 12/19/2016    Priority: Low   Onychomycosis 07/22/2014    Priority: Low   Left ankle swelling 07/22/2014    Priority: Low   History of skin cancer 05/17/2014    Priority: Low   Chronic rhinitis 10/28/2012    Priority: Low   Pulmonary nodule 09/23/2011    Priority: Low   History of colonic polyps 08/23/2008    Priority: Low   Chronic systolic congestive heart failure (HCC) 12/10/2021  AVM (arteriovenous malformation) of small bowel, 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      albuterol (VENTOLIN HFA) 108 (90 Base) MCG/ACT inhaler USE 2 INHALATIONS BY MOUTH  EVERY 6 HOURS AS NEEDED FOR WHEEZING 72 g 3   azithromycin (ZITHROMAX) 250 MG tablet Take 250 mg  by mouth 3 (three) times a week.     Calcium Carbonate-Vitamin D (CALCIUM 600+D PO) Take 2 tablets by mouth daily.     ELIQUIS 2.5 MG TABS tablet TAKE 1 TABLET BY MOUTH TWICE  DAILY 180 tablet 3   Ferrous Sulfate (IRON) 325 (65 Fe) MG TABS Take 325 mg by mouth every other day.     fluticasone (FLONASE) 50 MCG/ACT nasal spray USE 2 SPRAYS IN BOTH  NOSTRILS DAILY 48 g 3   folic acid (FOLVITE) 1 MG tablet Take 1 tablet (1 mg total) by mouth daily.     furosemide (LASIX) 20 MG tablet TAKE 1 TABLET BY MOUTH  DAILY AS NEEDED FOR FLUID  OR EDEMA 90 tablet 3   hydrOXYzine (VISTARIL) 25 MG capsule Take 1 capsule (25 mg total) by mouth every 8 (eight) hours as needed. 30 capsule 0   magic mouthwash (nystatin, lidocaine, diphenhydrAMINE, alum & mag hydroxide) suspension Swish and spit 5 mLs 4 (four) times daily. 180 mL 1   magic mouthwash SOLN Take 5 mLs by mouth 3 (three) times daily as needed for mouth pain. 150 mL 0   Multiple Vitamin (MULTIVITAMIN WITH MINERALS) TABS tablet Take 1 tablet by mouth daily.     mupirocin cream (BACTROBAN) 2 % Apply 1 application topically 2 (two) times daily. (Patient taking differently: Apply 1 application  topically daily as needed (Cyst).) 22 g 0   oxyCODONE (OXY IR/ROXICODONE) 5 MG immediate release tablet Take 1 tablet (5 mg total) by mouth every 4 (four) hours as needed for severe pain. 60 tablet 0   oxycodone (OXY-IR) 5 MG capsule Take 1 capsule (5 mg total) by mouth every 6 (six) hours as needed (arm pain after fall- particularly with dressing changes due to sticking issue). 20 capsule 0   OXYGEN Inhale 4-5 L into the lungs continuous.      pantoprazole (PROTONIX) 20 MG tablet TAKE 1 TABLET BY MOUTH  DAILY 90 tablet 3   Polyethyl Glycol-Propyl Glycol (SYSTANE) 0.4-0.3 % SOLN Place 1 drop into both eyes every other day.     predniSONE (DELTASONE) 10 MG tablet Take 1 tablet (10 mg total) by mouth daily with breakfast. 90 tablet 3   Respiratory Therapy Supplies (FLUTTER)  DEVI 10 times Twice a day and prn as needed, may increase if feeling worse 1 each 0   roflumilast (DALIRESP) 500 MCG TABS tablet TAKE 1 TABLET BY MOUTH  DAILY 90 tablet 3   rosuvastatin (CRESTOR) 10 MG tablet TAKE 1 TABLET BY MOUTH DAILY 90 tablet 3   SPIRIVA RESPIMAT 2.5 MCG/ACT AERS USE 2 INHALATIONS BY MOUTH  DAILY 12 g 3   SYMBICORT 160-4.5 MCG/ACT inhaler USE 2 INHALATIONS BY MOUTH  TWICE DAILY 30.6 g 3   triamcinolone cream (KENALOG) 0.1 % Apply 1 application. topically 2 (two) times daily. For 7-10 days maximum 80 g 0   doxycycline (  VIBRA-TABS) 100 MG tablet Take 1 tablet (100 mg total) by mouth 2 (two) times daily. 14 tablet 0   No current facility-administered medications for this visit.     Objective:  BP 95/61   Pulse (!) 115   Temp 98.2 F (36.8 C)   SpO2 94% Comment: 4L of oxygen self reported vitals Gen: NAD, resting comfortably Lungs: Slightly labored breathing per baseline, wearing oxygen Skin: appears dry, no obvious rash Arm was recently wrapped-he wanted to hold off on unwrapping     Assessment and Plan   # Fall S:He fell on Saturday evening after MRI Saturday morning. Got out of chair and walked over to the sink. Does not recall feeling dizzy.  He was washing coffee cup out and next thing he knows he was on the ground (denies passing out)- wife came extremely quickly and checked on him and was alert.   Hit his left  arm on the way down (same as last injury we saw him for) and landed on his back. Did not hit head. He is having left low back pain and hard to get out of the house- plans to see [name] when feels better- I told him likely needed x-ray. Can only take about 4 steps- has bedside commode.   He states extent of it is similar. No surrounding redness or warmth. Wife is doing a great job with wound care. No fevers or chills. No subsequent feelings like he is going to fall but is mainly seated.   A/P: I told patient was very concerned about his fall and  level in mobility-I recommended he see Dr. Jillyn Hidden as soon as possible even if he needed to use emergency transport.  He declines at this time.  He does agree if not improving within a week to seek orthopedic evaluation  As far as his arm he would like to start empiric antibiotics to prevent infection-with the number of antibiotics he has had to take I encouraged him to instead take antibiotics only if signs of infection such as increasing redness, warmth, tenderness or any discharge-he is in agreement after discussion  Story is not completely clear as to what caused the fall which is concerning-he was near his sink and should have been able to hold on but fell backwards.  Question of syncope he declines further workup for now and wants to mainly focus on wound care.  No recurrent symptoms and no chest pain or worsening shortness of breath from baseline reported-does not rule out cardiac condition.  His heart rate is high at baseline though I did discuss for mobility some risk of DVT though this should be reduced with Eliquis  Does not sound like he hit his head and having no headaches-we will hold off on neuroimaging  Recommended follow up: No planned follow-up at this time-she is focusing on care of this cancer with upcoming visits Future Appointments  Date Time Provider Department Center  12/24/2022 10:00 AM [facility] None  12/26/2022  3:15 PM Si Gaul, MD [facility] None  01/22/2023 11:00 AM [facility] 1 [facility]  01/27/2023  7:00 AM [facility] None  01/27/2023  1:30 PM Ronny Bacon, PA-C [facility] None    Lab/Order associations:   ICD-10-CM   1. Fall, initial encounter  W19.XXXA     2. Acute left-sided low back pain, unspecified whether sciatica present  M54.50       Meds ordered this encounter  Medications   doxycycline (VIBRA-TABS) 100  MG tablet    Sig: Take 1 tablet (100 mg total) by mouth 2 (two) times daily.     Dispense:  14 tablet    Refill:  0    Time Spent: 25 minutes of total time (4:05 PM- 4:31 PM) was spent on the date of the encounter performing the following actions: Brief chart review prior to seeing the patient, obtaining history, performing a medically necessary exam-this was very limited as video only, counseling on the treatment plan as well as my concerns Patient is to be seen sooner for his back pain and possible compression fracture, placing orders, and documenting in our EHR.   Return precautions advised.  Tana Conch, MD

## 2022-07-30 NOTE — Patient Instructions (Signed)
Health Maintenance Due  Topic Date Due   Zoster Vaccines- Shingrix (1 of 2) Never done   Medicare Annual Wellness (AWV)  04/01/2019   COLONOSCOPY (Pts 45-73yrs Insurance coverage will need to be confirmed)  04/02/2020   COVID-19 Vaccine (7 - 2023-24 season) 05/29/2022    Recommended follow up: No follow-ups on file.

## 2022-07-31 MED ORDER — AZITHROMYCIN 250 MG PO TABS: 250.0000 mg | ORAL_TABLET | ORAL | 3 refills | Status: DC

## 2022-08-07 ENCOUNTER — Encounter: Payer: Self-pay | Admitting: Family Medicine

## 2022-08-08 NOTE — Telephone Encounter (Signed)
Seeing dr Randol Kern on 2/2 does not want to come in today

## 2022-08-09 ENCOUNTER — Encounter (HOSPITAL_COMMUNITY): Payer: Self-pay

## 2022-08-09 ENCOUNTER — Other Ambulatory Visit: Payer: Self-pay

## 2022-08-09 ENCOUNTER — Ambulatory Visit (INDEPENDENT_AMBULATORY_CARE_PROVIDER_SITE_OTHER): Payer: Medicare Other | Admitting: Internal Medicine

## 2022-08-09 ENCOUNTER — Inpatient Hospital Stay (HOSPITAL_COMMUNITY)
Admission: EM | Admit: 2022-08-09 | Discharge: 2022-08-17 | DRG: 377 | Disposition: A | Discharge status: Skilled Nursing Facility | Payer: Medicare Other | Source: Ambulatory Visit | Attending: Internal Medicine | Admitting: Internal Medicine

## 2022-08-09 ENCOUNTER — Emergency Department (HOSPITAL_COMMUNITY): Payer: Medicare Other

## 2022-08-09 ENCOUNTER — Encounter: Payer: Self-pay | Admitting: Internal Medicine

## 2022-08-09 VITALS — BP 110/70 | HR 115 | Temp 98.2°F | Ht 72.0 in

## 2022-08-09 DIAGNOSIS — E785 Hyperlipidemia, unspecified: Secondary | ICD-10-CM | POA: Diagnosis present

## 2022-08-09 DIAGNOSIS — R351 Nocturia: Secondary | ICD-10-CM | POA: Diagnosis present

## 2022-08-09 DIAGNOSIS — Z9981 Dependence on supplemental oxygen: Secondary | ICD-10-CM

## 2022-08-09 DIAGNOSIS — I2781 Cor pulmonale (chronic): Secondary | ICD-10-CM | POA: Diagnosis present

## 2022-08-09 DIAGNOSIS — J9611 Chronic respiratory failure with hypoxia: Secondary | ICD-10-CM | POA: Diagnosis present

## 2022-08-09 DIAGNOSIS — I2782 Chronic pulmonary embolism: Secondary | ICD-10-CM | POA: Diagnosis present

## 2022-08-09 DIAGNOSIS — I739 Peripheral vascular disease, unspecified: Secondary | ICD-10-CM | POA: Diagnosis present

## 2022-08-09 DIAGNOSIS — Z79899 Other long term (current) drug therapy: Secondary | ICD-10-CM

## 2022-08-09 DIAGNOSIS — G8929 Other chronic pain: Secondary | ICD-10-CM | POA: Diagnosis present

## 2022-08-09 DIAGNOSIS — Z66 Do not resuscitate: Secondary | ICD-10-CM | POA: Diagnosis present

## 2022-08-09 DIAGNOSIS — K31811 Angiodysplasia of stomach and duodenum with bleeding: Secondary | ICD-10-CM | POA: Diagnosis present

## 2022-08-09 DIAGNOSIS — J9621 Acute and chronic respiratory failure with hypoxia: Secondary | ICD-10-CM | POA: Diagnosis not present

## 2022-08-09 DIAGNOSIS — Z86718 Personal history of other venous thrombosis and embolism: Secondary | ICD-10-CM

## 2022-08-09 DIAGNOSIS — K5900 Constipation, unspecified: Secondary | ICD-10-CM | POA: Diagnosis present

## 2022-08-09 DIAGNOSIS — J441 Chronic obstructive pulmonary disease with (acute) exacerbation: Secondary | ICD-10-CM | POA: Diagnosis not present

## 2022-08-09 DIAGNOSIS — C3431 Malignant neoplasm of lower lobe, right bronchus or lung: Secondary | ICD-10-CM | POA: Diagnosis present

## 2022-08-09 DIAGNOSIS — Z881 Allergy status to other antibiotic agents status: Secondary | ICD-10-CM

## 2022-08-09 DIAGNOSIS — I5022 Chronic systolic (congestive) heart failure: Secondary | ICD-10-CM | POA: Diagnosis present

## 2022-08-09 DIAGNOSIS — D72829 Elevated white blood cell count, unspecified: Secondary | ICD-10-CM | POA: Diagnosis present

## 2022-08-09 DIAGNOSIS — Z7952 Long term (current) use of systemic steroids: Secondary | ICD-10-CM

## 2022-08-09 DIAGNOSIS — S32000A Wedge compression fracture of unspecified lumbar vertebra, initial encounter for closed fracture: Secondary | ICD-10-CM

## 2022-08-09 DIAGNOSIS — Z86711 Personal history of pulmonary embolism: Secondary | ICD-10-CM

## 2022-08-09 DIAGNOSIS — R000 Tachycardia, unspecified: Secondary | ICD-10-CM | POA: Diagnosis present

## 2022-08-09 DIAGNOSIS — Z792 Long term (current) use of antibiotics: Secondary | ICD-10-CM

## 2022-08-09 DIAGNOSIS — K922 Gastrointestinal hemorrhage, unspecified: Secondary | ICD-10-CM

## 2022-08-09 DIAGNOSIS — I110 Hypertensive heart disease with heart failure: Secondary | ICD-10-CM | POA: Diagnosis present

## 2022-08-09 DIAGNOSIS — K449 Diaphragmatic hernia without obstruction or gangrene: Secondary | ICD-10-CM | POA: Diagnosis present

## 2022-08-09 DIAGNOSIS — M4856XA Collapsed vertebra, not elsewhere classified, lumbar region, initial encounter for fracture: Secondary | ICD-10-CM | POA: Diagnosis present

## 2022-08-09 DIAGNOSIS — I70203 Unspecified atherosclerosis of native arteries of extremities, bilateral legs: Secondary | ICD-10-CM | POA: Diagnosis present

## 2022-08-09 DIAGNOSIS — J439 Emphysema, unspecified: Secondary | ICD-10-CM | POA: Diagnosis present

## 2022-08-09 DIAGNOSIS — I5043 Acute on chronic combined systolic (congestive) and diastolic (congestive) heart failure: Secondary | ICD-10-CM | POA: Diagnosis present

## 2022-08-09 DIAGNOSIS — I2729 Other secondary pulmonary hypertension: Secondary | ICD-10-CM | POA: Diagnosis present

## 2022-08-09 DIAGNOSIS — Z923 Personal history of irradiation: Secondary | ICD-10-CM

## 2022-08-09 DIAGNOSIS — Z87891 Personal history of nicotine dependence: Secondary | ICD-10-CM

## 2022-08-09 DIAGNOSIS — K625 Hemorrhage of anus and rectum: Secondary | ICD-10-CM

## 2022-08-09 DIAGNOSIS — I959 Hypotension, unspecified: Secondary | ICD-10-CM | POA: Diagnosis not present

## 2022-08-09 DIAGNOSIS — Z823 Family history of stroke: Secondary | ICD-10-CM

## 2022-08-09 DIAGNOSIS — G250 Essential tremor: Secondary | ICD-10-CM | POA: Diagnosis present

## 2022-08-09 DIAGNOSIS — M545 Low back pain, unspecified: Secondary | ICD-10-CM

## 2022-08-09 DIAGNOSIS — Z888 Allergy status to other drugs, medicaments and biological substances status: Secondary | ICD-10-CM

## 2022-08-09 DIAGNOSIS — I10 Essential (primary) hypertension: Secondary | ICD-10-CM | POA: Diagnosis present

## 2022-08-09 DIAGNOSIS — K3182 Dieulafoy lesion (hemorrhagic) of stomach and duodenum: Principal | ICD-10-CM

## 2022-08-09 DIAGNOSIS — Z880 Allergy status to penicillin: Secondary | ICD-10-CM

## 2022-08-09 DIAGNOSIS — K31819 Angiodysplasia of stomach and duodenum without bleeding: Secondary | ICD-10-CM

## 2022-08-09 DIAGNOSIS — W19XXXA Unspecified fall, initial encounter: Secondary | ICD-10-CM | POA: Diagnosis present

## 2022-08-09 DIAGNOSIS — Z85118 Personal history of other malignant neoplasm of bronchus and lung: Secondary | ICD-10-CM

## 2022-08-09 DIAGNOSIS — Z7901 Long term (current) use of anticoagulants: Secondary | ICD-10-CM

## 2022-08-09 DIAGNOSIS — D500 Iron deficiency anemia secondary to blood loss (chronic): Secondary | ICD-10-CM | POA: Diagnosis present

## 2022-08-09 DIAGNOSIS — Z85828 Personal history of other malignant neoplasm of skin: Secondary | ICD-10-CM

## 2022-08-09 DIAGNOSIS — J449 Chronic obstructive pulmonary disease, unspecified: Secondary | ICD-10-CM | POA: Diagnosis present

## 2022-08-09 DIAGNOSIS — D62 Acute posthemorrhagic anemia: Secondary | ICD-10-CM

## 2022-08-09 DIAGNOSIS — Z7951 Long term (current) use of inhaled steroids: Secondary | ICD-10-CM

## 2022-08-09 DIAGNOSIS — Z86010 Personal history of colonic polyps: Secondary | ICD-10-CM

## 2022-08-09 DIAGNOSIS — N401 Enlarged prostate with lower urinary tract symptoms: Secondary | ICD-10-CM | POA: Diagnosis present

## 2022-08-09 DIAGNOSIS — I872 Venous insufficiency (chronic) (peripheral): Secondary | ICD-10-CM | POA: Diagnosis present

## 2022-08-09 DIAGNOSIS — K921 Melena: Secondary | ICD-10-CM

## 2022-08-09 DIAGNOSIS — I878 Other specified disorders of veins: Secondary | ICD-10-CM | POA: Diagnosis present

## 2022-08-09 DIAGNOSIS — Z8249 Family history of ischemic heart disease and other diseases of the circulatory system: Secondary | ICD-10-CM

## 2022-08-09 DIAGNOSIS — K2970 Gastritis, unspecified, without bleeding: Secondary | ICD-10-CM

## 2022-08-09 LAB — CBC WITH DIFFERENTIAL/PLATELET
Abs Immature Granulocytes: 0.18 10*3/uL — ABNORMAL HIGH (ref 0.00–0.07)
Basophils Absolute: 0.1 10*3/uL (ref 0.0–0.1)
Basophils Relative: 1 %
Eosinophils Absolute: 0 10*3/uL (ref 0.0–0.5)
Eosinophils Relative: 0 %
HCT: 34.7 % — ABNORMAL LOW (ref 39.0–52.0)
Hemoglobin: 11 g/dL — ABNORMAL LOW (ref 13.0–17.0)
Immature Granulocytes: 2 %
Lymphocytes Relative: 4 %
Lymphs Abs: 0.5 10*3/uL — ABNORMAL LOW (ref 0.7–4.0)
MCH: 31.5 pg (ref 26.0–34.0)
MCHC: 31.7 g/dL (ref 30.0–36.0)
MCV: 99.4 fL (ref 80.0–100.0)
Monocytes Absolute: 0.5 10*3/uL (ref 0.1–1.0)
Monocytes Relative: 5 %
Neutro Abs: 9.5 10*3/uL — ABNORMAL HIGH (ref 1.7–7.7)
Neutrophils Relative %: 88 %
Platelets: 307 10*3/uL (ref 150–400)
RBC: 3.49 MIL/uL — ABNORMAL LOW (ref 4.22–5.81)
RDW: 13.2 % (ref 11.5–15.5)
WBC: 10.7 10*3/uL — ABNORMAL HIGH (ref 4.0–10.5)
nRBC: 0 % (ref 0.0–0.2)

## 2022-08-09 LAB — COMPREHENSIVE METABOLIC PANEL
ALT: 18 U/L (ref 0–44)
AST: 23 U/L (ref 15–41)
Albumin: 3.2 g/dL — ABNORMAL LOW (ref 3.5–5.0)
Alkaline Phosphatase: 90 U/L (ref 38–126)
Anion gap: 12 (ref 5–15)
BUN: 12 mg/dL (ref 8–23)
CO2: 28 mmol/L (ref 22–32)
Calcium: 9.2 mg/dL (ref 8.9–10.3)
Chloride: 99 mmol/L (ref 98–111)
Creatinine, Ser: 1.12 mg/dL (ref 0.61–1.24)
GFR, Estimated: >60 mL/min (ref >60)
Glucose, Bld: 108 mg/dL — ABNORMAL HIGH (ref 70–99)
Potassium: 4.7 mmol/L (ref 3.5–5.1)
Sodium: 139 mmol/L (ref 135–145)
Total Bilirubin: 0.7 mg/dL (ref 0.3–1.2)
Total Protein: 6.2 g/dL — ABNORMAL LOW (ref 6.5–8.1)

## 2022-08-09 LAB — PROTIME-INR
INR: 1.2 (ref 0.8–1.2)
Prothrombin Time: 14.8 seconds (ref 11.4–15.2)

## 2022-08-09 LAB — TYPE AND SCREEN
ABO/RH(D): A POS
Antibody Screen: NEGATIVE

## 2022-08-09 LAB — POC OCCULT BLOOD, ED: Fecal Occult Bld: POSITIVE — AB

## 2022-08-09 MED ORDER — UMECLIDINIUM BROMIDE 62.5 MCG/ACT IN AEPB
1.0000 | INHALATION_SPRAY | Freq: Every day | RESPIRATORY_TRACT | Status: DC
  Filled 2022-08-09: qty 7

## 2022-08-09 MED ORDER — ACETAMINOPHEN 650 MG RE SUPP: 650.0000 mg | Freq: Four times a day (QID) | RECTAL | Status: DC | PRN

## 2022-08-09 MED ORDER — PANTOPRAZOLE SODIUM 20 MG PO TBEC: 20.0000 mg | DELAYED_RELEASE_TABLET | Freq: Every day | ORAL | Status: DC

## 2022-08-09 MED ORDER — POLYETHYLENE GLYCOL 3350 17 G PO PACK
17.0000 g | PACK | Freq: Every day | ORAL | Status: DC
  Administered 2022-08-10 – 2022-08-14 (×3): 17 g via ORAL
  Filled 2022-08-09 (×3): qty 1

## 2022-08-09 MED ORDER — ALBUTEROL SULFATE (2.5 MG/3ML) 0.083% IN NEBU
2.5000 mg | INHALATION_SOLUTION | Freq: Four times a day (QID) | RESPIRATORY_TRACT | Status: DC
  Filled 2022-08-09 (×2): qty 3

## 2022-08-09 MED ORDER — PREDNISONE 5 MG PO TABS
10.0000 mg | ORAL_TABLET | Freq: Every day | ORAL | Status: DC
  Administered 2022-08-10 – 2022-08-15 (×6): 10 mg via ORAL
  Filled 2022-08-09 (×6): qty 2

## 2022-08-09 MED ORDER — SODIUM CHLORIDE 0.9 % IV BOLUS
500.0000 mL | Freq: Once | INTRAVENOUS | Status: AC
  Administered 2022-08-09: 500 mL via INTRAVENOUS

## 2022-08-09 MED ORDER — MOMETASONE FURO-FORMOTEROL FUM 200-5 MCG/ACT IN AERO
2.0000 | INHALATION_SPRAY | Freq: Two times a day (BID) | RESPIRATORY_TRACT | Status: DC
  Filled 2022-08-09: qty 8.8

## 2022-08-09 MED ORDER — ROFLUMILAST 500 MCG PO TABS
500.0000 ug | ORAL_TABLET | Freq: Every day | ORAL | Status: DC
  Administered 2022-08-10 – 2022-08-17 (×7): 500 ug via ORAL
  Filled 2022-08-09 (×8): qty 1

## 2022-08-09 MED ORDER — ACETAMINOPHEN 500 MG PO TABS
1000.0000 mg | ORAL_TABLET | Freq: Four times a day (QID) | ORAL | Status: DC
  Administered 2022-08-09 – 2022-08-17 (×23): 1000 mg via ORAL
  Filled 2022-08-09 (×25): qty 2

## 2022-08-09 MED ORDER — ALBUTEROL SULFATE HFA 108 (90 BASE) MCG/ACT IN AERS: 1.0000 | INHALATION_SPRAY | Freq: Four times a day (QID) | RESPIRATORY_TRACT | Status: DC | PRN

## 2022-08-09 MED ORDER — FENTANYL CITRATE PF 50 MCG/ML IJ SOSY
50.0000 ug | PREFILLED_SYRINGE | Freq: Once | INTRAMUSCULAR | Status: AC
  Administered 2022-08-09: 50 ug via INTRAVENOUS
  Filled 2022-08-09: qty 1

## 2022-08-09 MED ORDER — IOHEXOL 350 MG/ML SOLN
75.0000 mL | Freq: Once | INTRAVENOUS | Status: AC | PRN
  Administered 2022-08-09: 75 mL via INTRAVENOUS

## 2022-08-09 MED ORDER — FUROSEMIDE 20 MG PO TABS: 20.0000 mg | ORAL_TABLET | Freq: Every day | ORAL | Status: DC

## 2022-08-09 MED ORDER — OXYCODONE HCL 5 MG PO TABS
5.0000 mg | ORAL_TABLET | Freq: Once | ORAL | Status: AC
  Administered 2022-08-09: 5 mg via ORAL
  Filled 2022-08-09: qty 1

## 2022-08-09 MED ORDER — SODIUM CHLORIDE 0.9% FLUSH
3.0000 mL | Freq: Two times a day (BID) | INTRAVENOUS | Status: DC
  Administered 2022-08-09 – 2022-08-17 (×16): 3 mL via INTRAVENOUS

## 2022-08-09 MED ORDER — ACETAMINOPHEN 325 MG PO TABS: 650.0000 mg | ORAL_TABLET | Freq: Four times a day (QID) | ORAL | Status: DC | PRN

## 2022-08-09 MED ORDER — ROSUVASTATIN CALCIUM 5 MG PO TABS
10.0000 mg | ORAL_TABLET | Freq: Every day | ORAL | Status: DC
  Administered 2022-08-10 – 2022-08-17 (×8): 10 mg via ORAL
  Filled 2022-08-09 (×8): qty 2

## 2022-08-09 MED ORDER — FERROUS SULFATE 325 (65 FE) MG PO TABS
325.0000 mg | ORAL_TABLET | ORAL | Status: DC
  Administered 2022-08-10 – 2022-08-16 (×5): 325 mg via ORAL
  Filled 2022-08-09 (×6): qty 1

## 2022-08-09 MED ORDER — OXYCODONE HCL 5 MG PO TABS
5.0000 mg | ORAL_TABLET | Freq: Four times a day (QID) | ORAL | Status: DC | PRN
  Administered 2022-08-09 – 2022-08-13 (×7): 5 mg via ORAL
  Filled 2022-08-09 (×7): qty 1

## 2022-08-09 NOTE — H&P (Addendum)
History and Physical   Francisco [name] ZCH:[id number] DOB: Jun 02, 1946 DOA: 08/09/2022  PCP: [name], MD   Patient coming from: Home/PCP office  Chief Complaint: Dark stools  HPI: Francisco [name] is a 77 y.o. male with medical history significant of COPD, venous stasis, PAD, anemia, hyperlipidemia, history of GI bleed with AVM, BPH, lung cancer on observation, essential tremor, hypertension, CHF, PE on Eliquis presenting with dark stools from PCP office.  Patient has had dark stools for the past 2 days.  Is on iron therapy but PCP felt he looked pale and sent him to the ED for further evaluation for possible GI bleed.  This is in the setting of history of known prior GI bleed and currently on Eliquis.  Patient additionally had a fall 2 weeks ago and has had some low back pain since that time.  Additionally is reporting some constipation.  Denies fevers, chills, chest pain, shortness of breath, abdominal pain, nausea, vomiting.  ED Course: Vital signs in ED significant for heart rate in the 100s to 110s, blood pressure in the 28N to 867E systolic, on chronic 4 L supplemental O2.  Lab workup included CMP with glucose 108, protein 6.3, albumin 3.2.  CBC with leukocytosis stable at 10.7, hemoglobin of 11 down from baseline of 12-12.5.  PT and INR normal.  FOBT positive.  Type and screen pending.  CT abdomen pelvis showed acute L2 fracture with mild retropulsion and a left inguinal hernia without obstruction but does contain some bowel, stable right inguinal hernia.  CT L-spine showed acute to subacute L2 fracture which was new from MRI January 20, chronic L4 fracture and indeterminate age L3 endplate fracture.  Patient received fentanyl and 500 cc IV fluid in the ED.  EDP to send a message to GI for consult in the morning.  Review of Systems: As per HPI otherwise all other systems reviewed and are negative.  Past Medical History:  Diagnosis Date   Chronic back pain    "mid and lower"  (04/07/2018)   Chronic rhinitis    -Sinus Ct 08/01/2009 >> Bilateral maxillary sinusitis with some mucosal thickeningin the sphenoid and frontal sinuses as well with air fluid levels present -chronic rhinitis flyer Aug 04, 2009   Compressed spine fracture [facility])    COPD (chronic obstructive pulmonary disease) (HCC)    PFT's rec Jul 17, 2009   Dyspnea    Emphysema lung ([place])    On home oxygen therapy    "3L; 24/7" (04/07/2018)   Onychomycosis    Dr. Judi Cong   Orthostatic hypotension    "since 10/2017" (04/07/2018)   PAD (peripheral artery disease) ([place])    Pneumonia    "twice in 1 year" (04/07/2018)   Pulmonary embolism ([place]) 04/07/2018   Skin cancer    "lips, face, ears, arms" (04/07/2018)   Small cell lung cancer, right ([place]) 2023   Dr. Lisbeth Renshaw.  rad   Vertigo    "since ~ 02/2018" (04/07/2018)    Past Surgical History:  Procedure Laterality Date   ABDOMINAL AORTOGRAM W/LOWER EXTREMITY N/A 01/21/2020   Procedure: ABDOMINAL AORTOGRAM W/LOWER EXTREMITY;  Surgeon: Elam Dutch, MD;  Location: [place] [facility];  Service: Cardiovascular;  Laterality: N/A;   APPENDECTOMY     BIOPSY  02/20/2020   Procedure: BIOPSY;  Surgeon: Rush Landmark Telford Nab., MD;  Location: [place];  Service: Gastroenterology;;   BRONCHIAL BIOPSY  09/04/2021   Procedure: BRONCHIAL BIOPSIES;  Surgeon: Garner Nash, DO;  Location: [place] [facility];  Service: Pulmonary;;   BRONCHIAL BRUSHINGS  09/04/2021   Procedure: BRONCHIAL BRUSHINGS;  Surgeon: Garner Nash, DO;  Location: [place];  Service: Pulmonary;;   BRONCHIAL NEEDLE ASPIRATION BIOPSY  09/04/2021   Procedure: BRONCHIAL NEEDLE ASPIRATION BIOPSIES;  Surgeon: Garner Nash, DO;  Location: [place];  Service: Pulmonary;;   CATARACT EXTRACTION, BILATERAL     ENDARTERECTOMY FEMORAL Right 04/19/2019   Procedure: ENDARTERECTOMY RIGHT COMMON FEMORAL;  Surgeon: Elam Dutch, MD;  Location[facility];  Service: Vascular;  Laterality: Right;    ENDARTERECTOMY FEMORAL Left 01/24/2020   Procedure: LEFT FEMORAL ENDARTERECTOMY WITH DACRON PATCH ANGIOPLASTY;  Surgeon: Elam Dutch, MD;  [facility];  Service: Vascular;  Laterality: Left;   ESOPHAGOGASTRODUODENOSCOPY (EGD) WITH PROPOFOL N/A 02/20/2020   Procedure: ESOPHAGOGASTRODUODENOSCOPY (EGD) WITH PROPOFOL;  Surgeon: Irving Copas., MD;  Location: [place];  Service: Gastroenterology;  Laterality: N/A;   FEMORAL ENDARTERECTOMY Left 01/24/2020   FIDUCIAL MARKER PLACEMENT  09/04/2021   Procedure: FIDUCIAL MARKER PLACEMENT;  Surgeon: Garner Nash, DO;  Location: [place];  Service: Pulmonary;;   HEMOSTASIS CLIP PLACEMENT  02/20/2020   Procedure: HEMOSTASIS CLIP PLACEMENT;  Surgeon: Irving Copas., MD;  Location: [place];  Service: Gastroenterology;;   HOT HEMOSTASIS N/A 02/20/2020   Procedure: HOT HEMOSTASIS (ARGON PLASMA COAGULATION/BICAP);  Surgeon: Irving Copas., MD;  Location: [place];  Service: Gastroenterology;  Laterality: N/A;   INSERTION OF ILIAC STENT Left 01/24/2020   Procedure: INSERTION OF VBX STENT 8X59 AND 8X39 LEFT COMMON ILIAC ARTERY. INSERTION OF INNOVA 7 X 60 INNOVA STENT LEFT EXTERNAL ILIAC ARTERY. ;  Surgeon: Elam Dutch, MD;  Location: [place];  Service: Vascular;  Laterality: Left;   LOWER EXTREMITY ANGIOGRAPHY  12/11/2018   LOWER EXTREMITY ANGIOGRAPHY N/A 12/11/2018   Procedure: LOWER EXTREMITY ANGIOGRAPHY;  Surgeon: Elam Dutch, MD;  Location: [place] [facility];  Service: Cardiovascular;  Laterality: N/A;   PATCH ANGIOPLASTY Right 04/19/2019   Procedure: Patch Angioplasty;  Surgeon: Elam Dutch, MD;  Location: [place] OR;  Service: Vascular;  Laterality: Right;   PERIPHERAL VASCULAR INTERVENTION Right 12/11/2018   Procedure: PERIPHERAL VASCULAR INTERVENTION;  Surgeon: Elam Dutch, MD;  Location: [place] [facility];  Service: Cardiovascular;  Laterality: Right;  Common Iliac    SKIN CANCER EXCISION      "lips, face, ears, arms" (04/07/2018)   TONSILLECTOMY     ULTRASOUND GUIDANCE FOR VASCULAR ACCESS Right 01/24/2020   Procedure: ULTRASOUND GUIDANCE FOR VASCULAR ACCESS;  Surgeon: Elam Dutch, MD;  Location: [place];  Service: Vascular;  Laterality: Right;   VIDEO BRONCHOSCOPY WITH RADIAL ENDOBRONCHIAL ULTRASOUND  09/04/2021   Procedure: VIDEO BRONCHOSCOPY WITH RADIAL ENDOBRONCHIAL ULTRASOUND;  Surgeon: Garner Nash, DO;  Location: [place] [facility];  Service: Pulmonary;;    Social History  reports that he quit smoking about 4 years ago. His smoking use included pipe and cigarettes. He started smoking about 56 years ago. He has a 104.00 pack-year smoking history. He has never used smokeless tobacco. He reports current alcohol use of about 35.0 standard drinks of alcohol per week. He reports that he does not use drugs.  Allergies  Allergen Reactions   Tape Other (See Comments)    SKIN IS VERY THIN, TEARS SKIN; CAN ONLY USE COBAN WRAPS DUE TO CONDITION OF SKIN!!   Amoxicillin-Pot Clavulanate Other (See Comments)    Thrush/ Sore throat/Voice became hoarse   Ciprofloxacin Nausea Only    Sick  on stomach, weak/tired   Gadavist [Gadobutrol] Other (See Comments)    With MRIs in January 2023- plan is to give    Levaquin [Levofloxacin] Other (See Comments)    hallucinations   Lipitor [Atorvastatin] Rash    Family History  Problem Relation Age of Onset   Heart disease Mother        CABG in her 47s, nonsmoker   Cancer Mother    Stroke Father    Heart disease Father        Died of MI at age 31, smoker   Hepatitis Sister    Coronary artery disease Other        male 1st degree relative <60   Colon cancer Neg Hx    Pancreatic cancer Neg Hx    Esophageal cancer Neg Hx    Inflammatory bowel disease Neg Hx    Liver disease Neg Hx    Rectal cancer Neg Hx    Stomach cancer Neg Hx   Reviewed on admission  Prior to Admission medications   Medication Sig Start Date End Date Taking?  Authorizing Provider  acetaminophen (TYLENOL) 500 MG tablet Take 1,000 mg by mouth every 6 (six) hours as needed for moderate pain.     [provider]  albuterol (VENTOLIN HFA) 108 (90 Base) MCG/ACT inhaler USE 2 INHALATIONS BY MOUTH  EVERY 6 HOURS AS NEEDED FOR WHEEZING 12/31/21   Brand Males, MD  azithromycin (ZITHROMAX) 250 MG tablet Take 1 tablet (250 mg total) by mouth 3 (three) times a week. 07/31/22   Brand Males, MD  Calcium Carbonate-Vitamin D (CALCIUM 600+D PO) Take 2 tablets by mouth daily.    [provider]  doxycycline (VIBRA-TABS) 100 MG tablet Take 1 tablet (100 mg total) by mouth 2 (two) times daily. 07/30/22   [name], MD  ELIQUIS 2.5 MG TABS tablet TAKE 1 TABLET BY MOUTH TWICE  DAILY 05/15/22   [name], MD  Ferrous Sulfate (IRON) 325 (65 Fe) MG TABS Take 325 mg by mouth every other day.    [provider]  fluticasone (FLONASE) 50 MCG/ACT nasal spray USE 2 SPRAYS IN BOTH  NOSTRILS DAILY 03/18/22   [name], MD  folic acid (FOLVITE) 1 MG tablet Take 1 tablet (1 mg total) by mouth daily. 02/26/20   Mikhail, Velta Addison, DO  furosemide (LASIX) 20 MG tablet TAKE 1 TABLET BY MOUTH  DAILY AS NEEDED FOR FLUID  OR EDEMA 12/17/21   [name], MD  hydrOXYzine (VISTARIL) 25 MG capsule Take 1 capsule (25 mg total) by mouth every 8 (eight) hours as needed. 01/29/22   Jeanie Sewer, NP  magic mouthwash (nystatin, lidocaine, diphenhydrAMINE, alum & mag hydroxide) suspension Swish and spit 5 mLs 4 (four) times daily. 11/28/21   Tawnya Crook, MD  magic mouthwash SOLN Take 5 mLs by mouth 3 (three) times daily as needed for mouth pain. 11/27/21   Tawnya Crook, MD  Multiple Vitamin (MULTIVITAMIN WITH MINERALS) TABS tablet Take 1 tablet by mouth daily. 02/26/20   Mikhail, Velta Addison, DO  mupirocin cream (BACTROBAN) 2 % Apply 1 application topically 2 (two) times daily. Patient taking differently: Apply 1 application  topically  daily as needed (Cyst). 08/21/20   [name], MD  oxyCODONE (OXY IR/ROXICODONE) 5 MG immediate release tablet Take 1 tablet (5 mg total) by mouth every 4 (four) hours as needed for severe pain. 10/23/21   Hayden Pedro, PA-C  oxycodone (OXY-IR) 5 MG capsule  Take 1 capsule (5 mg total) by mouth every 6 (six) hours as needed (arm pain after fall- particularly with dressing changes due to sticking issue). 05/02/22   [name], MD  OXYGEN Inhale 4-5 L into the lungs continuous.     [provider]  pantoprazole (PROTONIX) 20 MG tablet TAKE 1 TABLET BY MOUTH  DAILY 02/04/22   [name], MD  Polyethyl Glycol-Propyl Glycol (SYSTANE) 0.4-0.3 % SOLN Place 1 drop into both eyes every other day.    [provider]  predniSONE (DELTASONE) 10 MG tablet Take 1 tablet (10 mg total) by mouth daily with breakfast. 03/19/22   Brand Males, MD  Respiratory Therapy Supplies (FLUTTER) DEVI 10 times Twice a day and prn as needed, may increase if feeling worse 10/15/19   Lauraine Rinne, NP  roflumilast (DALIRESP) 500 MCG TABS tablet TAKE 1 TABLET BY MOUTH  DAILY 05/03/22   Brand Males, MD  rosuvastatin (CRESTOR) 10 MG tablet TAKE 1 TABLET BY MOUTH DAILY 05/15/22   [name], MD  SPIRIVA RESPIMAT 2.5 MCG/ACT AERS USE 2 INHALATIONS BY MOUTH  DAILY 12/04/21   Brand Males, MD  SYMBICORT 160-4.5 MCG/ACT inhaler USE 2 INHALATIONS BY MOUTH  TWICE DAILY 08/14/21   [name], MD  triamcinolone cream (KENALOG) 0.1 % Apply 1 application. topically 2 (two) times daily. For 7-10 days maximum 12/10/21   [name], MD    Physical Exam: Vitals:   08/09/22 1345 08/09/22 1400 08/09/22 1659 08/09/22 1700  BP: (!) 151/80 109/88  (!) 160/84  Pulse: (!) 104 (!) 104  (!) 113  Resp: 14 15  16   Temp:   98.1 F (36.7 C)   TempSrc:   Oral   SpO2: 100% 100%  100%  Weight:      Height:        Physical Exam Constitutional:      General: He is not in acute  distress.    Appearance: Normal appearance.  HENT:     Head: Normocephalic and atraumatic.     Mouth/Throat:     Mouth: Mucous membranes are moist.     Pharynx: Oropharynx is clear.  Eyes:     Extraocular Movements: Extraocular movements intact.     Pupils: Pupils are equal, round, and reactive to light.  Cardiovascular:     Rate and Rhythm: Regular rhythm. Tachycardia present.     Pulses: Normal pulses.     Heart sounds: Normal heart sounds.  Pulmonary:     Effort: Pulmonary effort is normal. No respiratory distress.     Breath sounds: Normal breath sounds.  Abdominal:     General: Bowel sounds are normal. There is no distension.     Palpations: Abdomen is soft.     Tenderness: There is no abdominal tenderness.  Musculoskeletal:        General: No swelling or deformity.     Right lower leg: Edema present.     Left lower leg: Edema present.  Skin:    General: Skin is warm and dry.     Comments: Venous stasis changes  Neurological:     General: No focal deficit present.     Mental Status: Mental status is at baseline.    Labs on Admission: I have personally reviewed following labs and imaging studies  CBC: Recent Labs  Lab 08/09/22 1330  WBC 10.7*  NEUTROABS 9.5*  HGB 11.0*  HCT 34.7*  MCV 99.4  PLT 307  Basic Metabolic Panel: Recent Labs  Lab 08/09/22 1330  NA 139  K 4.7  CL 99  CO2 28  GLUCOSE 108*  BUN 12  CREATININE 1.12  CALCIUM 9.2    GFR: Estimated Creatinine Clearance: 63.5 mL/min (by C-G formula based on SCr of 1.12 mg/dL).  Liver Function Tests: Recent Labs  Lab 08/09/22 1330  AST 23  ALT 18  ALKPHOS 90  BILITOT 0.7  PROT 6.2*  ALBUMIN 3.2*    Urine analysis:    Component Value Date/Time   COLORURINE YELLOW 04/15/2019 1530   APPEARANCEUR CLEAR 04/15/2019 1530   LABSPEC 1.012 04/15/2019 1530   PHURINE 7.0 04/15/2019 1530   GLUCOSEU NEGATIVE 04/15/2019 1530   GLUCOSEU NEGATIVE 06/25/2017 0831   HGBUR SMALL (A) 04/15/2019  1530   HGBUR trace-lysed 02/12/2010 0819   BILIRUBINUR NEGATIVE 04/15/2019 1530   BILIRUBINUR Negative 12/25/2017 0852   KETONESUR NEGATIVE 04/15/2019 1530   PROTEINUR NEGATIVE 04/15/2019 1530   UROBILINOGEN 0.2 12/25/2017 0852   UROBILINOGEN 0.2 06/25/2017 0831   NITRITE NEGATIVE 04/15/2019 1530   LEUKOCYTESUR NEGATIVE 04/15/2019 1530    Radiological Exams on Admission: CT L-SPINE NO CHARGE  Result Date: 08/09/2022 CLINICAL DATA:  Abdominal pain.  Status post fall EXAM: CT LUMBAR SPINE WITHOUT CONTRAST TECHNIQUE: Multidetector CT imaging of the lumbar spine was performed without intravenous contrast administration. Multiplanar CT image reconstructions were also generated. RADIATION DOSE REDUCTION: This exam was performed according to the departmental dose-optimization program which includes automated exposure control, adjustment of the mA and/or kV according to patient size and/or use of iterative reconstruction technique. COMPARISON:  Abdominal MRI from 07/27/2022 and CT chest 06/21/2022. FINDINGS: Segmentation: 5 lumbar type vertebrae. Alignment: Mild curvature of the lumbar spine is convex towards the left. Vertebrae: Acute to subacute fracture deformity is identified involving the L2 vertebral body. This is new when compared with the MRI from 07/27/2022. Loss of approximately 50% of the central vertebral body height is identified. Mild retropulsion is identified along the posterior aspect of the superior endplate, image 56/4. L3 mild inferior endplate compression deformity is unchanged when compared with the MRI from 07/27/22 with loss of approximately 10% of the central vertebral body height. Chronic fracture deformity involving the L4 vertebral body is again noted with loss of approximately 70% of the central vertebral body height. Paraspinal and other soft tissues: Negative. Disc levels: Disc spaces are relatively well preserved. No significant degenerative disc disease. IMPRESSION: 1. Acute to  subacute fracture deformity of the L2 vertebral body is new when compared with the MRI from 07/27/2022. Loss of approximately 50% of the central vertebral body height is identified. Mild retropulsion is identified along the posterior aspect of the superior endplate. 2. Chronic fracture deformity involving the L4 vertebral body is again noted with loss of approximately 70% of the central vertebral body height. 3. Age-indeterminate L3 inferior endplate compression deformity is unchanged when compared with the MRI from 07/27/22. Electronically Signed   By: Kerby Moors M.D.   On: 08/09/2022 18:01   CT ABDOMEN PELVIS W CONTRAST  Result Date: 08/09/2022 CLINICAL DATA:  Acute abdominal pain EXAM: CT ABDOMEN AND PELVIS WITH CONTRAST TECHNIQUE: Multidetector CT imaging of the abdomen and pelvis was performed using the standard protocol following bolus administration of intravenous contrast. RADIATION DOSE REDUCTION: This exam was performed according to the departmental dose-optimization program which includes automated exposure control, adjustment of the mA and/or kV according to patient size and/or use of iterative reconstruction technique. CONTRAST:  39mL OMNIPAQUE IOHEXOL  350 MG/ML SOLN COMPARISON:  PET-CT 08/08/2021. MRI abdomen 07/27/2022. Lumbar spine MRI 06/06/2020. FINDINGS: Lower chest: Emphysema present Hepatobiliary: No focal liver abnormality is seen. No gallstones, gallbladder wall thickening, or biliary dilatation. Pancreas: There is a 1.7 x 1.7 cm low-attenuation/cystic area in the tail the pancreas which appears stable. No acute inflammation or ductal dilatation. Spleen: Normal in size without focal abnormality. Adrenals/Urinary Tract: Right inferior pole cyst measures 15 mm. Otherwise, the kidneys, adrenal glands and bladder are within normal limits. Stomach/Bowel: Stomach is within normal limits. Appendix is not seen. No evidence of bowel wall thickening, distention, or inflammatory changes.  Vascular/Lymphatic: Aortic atherosclerosis. No enlarged abdominal or pelvic lymph nodes. Reproductive: Prostate gland is mildly enlarged. Other: Left inguinal hernia containing colon is moderate in size and has increased from prior study. There is a small right inguinal hernia containing bladder, unchanged. There is no ascites. Musculoskeletal: Chronic compression fracture of L4 is unchanged. There is a acute appearing compression fracture of L2 with mild retropulsion of fracture fragments (30% vertebral body height loss). IMPRESSION: 1. Acute appearing compression fracture of L2 with mild retropulsion of fracture fragments. Please correlate clinically. 2. Left inguinal hernia containing colon has increased in size. No bowel obstruction. 3. Stable right inguinal hernia containing bladder. 4. 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 Aortic Atherosclerosis (ICD10-I70.0). Electronically Signed   By: Ronney Asters M.D.   On: 08/09/2022 17:56    EKG: Independently reviewed.  Sinus tachycardia 108 bpm.  Low voltage multiple leads.  Assessment/Plan Principal Problem:   GI bleed Active Problems:   Hyperlipidemia   BPH associated with nocturia   Chronic respiratory failure with hypoxia (HCC)   Essential hypertension   Essential tremor   Chronic pulmonary embolism (HCC)   Stage 4 very severe COPD by GOLD classification (HCC)   PAD (peripheral artery disease) (HCC)   Malignant neoplasm of lower lobe of right lung (HCC)   Iron deficiency anemia due to chronic blood loss   Chronic systolic congestive heart failure (HCC)   GI bleed Anemia  > Sent by PCP with concern for GI bleed.  Has had 2 days of dark stools.  Has history of prior GI bleed with AVMs and is currently on Eliquis for history of PE.  PCP also felt patient seemed pale from his baseline. > Is FOBT positive in ED.  Although he does take IV iron there was some redness appreciated on EDP exam.  Hemoglobin of 11 which is somewhat down from  baseline of 12-12.5. > Given chronic anticoagulation and decrease in hemoglobin with positive FOBT will monitor overnight and have GI evaluation tomorrow morning. - Monitor on telemetry - Clear liquid diet - EDP sent message to GI, appreciate recommendations - Trend hemoglobin  Lumbar compression fracture > Had a fall 2 weeks ago and has had some low back pain since that time.  Imaging in the ED indicated acute to subacute L2 compression fracture which is new from January 20 MRI. > Pain may be driving his mild tachycardia  - As needed pain control with home oxycodone - Consider PRN IV opioid if pain becomes inadequately controled  Severe COPD Chronic respiratory failure > Follows with pulmonology.  Currently on 4 to 5 L supplemental oxygen at baseline.  Also history of lung cancer as below. - Continue home supplemental oxygen, Spiriva, Daliresp - Replace home Symbicort formulary Dulera - Continue home prednisone  PAD HLD - Holding Eliquis - Continue home rosuvastatin  History of PE - Holding  Eliquis  History of lung cancer - Currently on observation per last oncology note  CHF > Last echo was in 2019 with EF 65-70%, G1 DD, normal RV function. - Currently is prescribed Lasix as needed  DVT prophylaxis: SCDs Code Status:   DNR, also has MOST form in [facility] Family Communication:  Updated at bedside Disposition Plan:   Patient is from:  Home  Anticipated DC to:  Home  Anticipated DC date:  To 2 days  Anticipated DC barriers: None  Consults called:  Message being sent to GI by EDP for evaluation in the morning Admission status:  Observation, telemetry  Severity of Illness: The appropriate patient status for this patient is OBSERVATION. Observation status is judged to be reasonable and necessary in order to provide the required intensity of service to ensure the patient's safety. The patient's presenting symptoms, physical exam findings, and initial radiographic and laboratory  data in the context of their medical condition is felt to place them at decreased risk for further clinical deterioration. Furthermore, it is anticipated that the patient will be medically stable for discharge from the hospital within 2 midnights of admission.    Marcelyn Bruins MD [facility]  How to contact the [facility] Attending or Consulting provider [place] or covering provider during after hours [place], for this patient?   Check the care team in [facility] and look for a) attending/consulting [facility] provider listed and b) the [facility] team listed Log into [email or web address] and use [place]'s universal password to access. If you do not have the password, please contact the hospital operator. Locate the [facility] provider you are looking for under [facility] and page to a number that you can be directly reached. If you still have difficulty reaching the provider, please page the [facility] (Director on Call) for the Hospitalists listed on amion for assistance.  08/09/2022, 6:47 PM

## 2022-08-09 NOTE — Assessment & Plan Note (Signed)
Seems to be recurring based on recent fall that is not otherwise explained, conjunctival pallor, melena x 1 week and increased fatigue.  Recommend going to emergency room for stat check on the blood count and probably urgent endoscopy because he is on Eliquis he agreed and they will directly transport to the main [facility] without EMS

## 2022-08-09 NOTE — ED Provider Triage Note (Signed)
Emergency Medicine Provider Triage Evaluation Note  Francisco [name] , a 77 y.o. male  was evaluated in triage.  Pt complains of black stool concern for GI bleed, patient is on Eliquis twice today and has history of GI bleeding, sent by his PCP.  Review of Systems  Positive: Black stool Negative: Dizziness or syncope  Physical Exam  Ht 6' (1.829 m)   Wt 83.5 kg   BMI 24.95 kg/m  Gen:   Awake, no distress   Resp:  Normal effort  MSK:   Moves extremities without difficulty  Other:    Medical Decision Making  Medically screening exam initiated at 12:53 PM.  Appropriate orders placed.  Francisco [name] was informed that the remainder of the evaluation will be completed by another provider, this initial triage assessment does not replace that evaluation, and the importance of remaining in the ED until their evaluation is complete.     Sherrye Payor A, [place] 08/09/22 1254

## 2022-08-09 NOTE — ED Notes (Signed)
ED TO INPATIENT HANDOFF REPORT  ED Nurse Name and Phone #: Haskell Riling  S Name/Age/Gender Francisco [name] 77 y.o. male Room/Bed: 019C/019C  Code Status   Code Status: DNR  Home/SNF/Other Home Patient oriented to: self, place, time, and situation Is this baseline? Yes   Triage Complete: Triage complete  Chief Complaint GI bleed [K92.2]  Triage Note Pt sent by PCP for having black tarry stool x5 days. Pt denies N/V/D. Pt states he had a fall for unknown reasons prior to symptoms starting with + LOC, back pain, and fatigue. Pt is on anticoagulants. Pt denies confusion, neuro symptoms following the fall. Pt is A/Ox4 during triage and is on 4L O2 via N/C at baseline.    Allergies Allergies  Allergen Reactions   Tape Other (See Comments)    SKIN IS VERY THIN, TEARS SKIN; CAN ONLY USE COBAN WRAPS DUE TO CONDITION OF SKIN!!   Amoxicillin-Pot Clavulanate Other (See Comments)    Thrush/ Sore throat/Voice became hoarse   Ciprofloxacin Nausea Only    Sick on stomach, weak/tired   Gadavist [Gadobutrol] Other (See Comments)    With MRIs in January 2023- plan is to give    Levaquin [Levofloxacin] Other (See Comments)    hallucinations   Lipitor [Atorvastatin] Rash    Level of Care/Admitting Diagnosis ED Disposition     ED Disposition  Admit   Condition  --   [place]: [place] [[id number]]  Level of Care: Telemetry Medical [104]  May place patient in observation at [facility] or [place] if equivalent level of care is available:: No  Covid Evaluation: Asymptomatic - no recent exposure (last 10 days) testing not required  Diagnosis: GI bleed [144315]  Admitting Physician: Marcelyn Bruins [[id number]]  Attending Physician: Marcelyn Bruins [[id number]]          B Medical/Surgery History Past Medical History:  Diagnosis Date   Chronic back pain    "mid and lower" (04/07/2018)   Chronic rhinitis    -Sinus Ct 08/01/2009 >> Bilateral  maxillary sinusitis with some mucosal thickeningin the sphenoid and frontal sinuses as well with air fluid levels present -chronic rhinitis flyer Aug 04, 2009   Compressed spine fracture [facility])    COPD (chronic obstructive pulmonary disease) ([place])    PFT's rec Jul 17, 2009   Dyspnea    Emphysema lung ([place])    On home oxygen therapy    "3L; 24/7" (04/07/2018)   Onychomycosis    Dr. Judi Cong   Orthostatic hypotension    "since 10/2017" (04/07/2018)   PAD (peripheral artery disease) ([place])    Pneumonia    "twice in 1 year" (04/07/2018)   Pulmonary embolism ([place]) 04/07/2018   Skin cancer    "lips, face, ears, arms" (04/07/2018)   Small cell lung cancer, right ([place]) 2023   Dr. Lisbeth Renshaw.  rad   Vertigo    "since ~ 02/2018" (04/07/2018)   Past Surgical History:  Procedure Laterality Date   ABDOMINAL AORTOGRAM W/LOWER EXTREMITY N/A 01/21/2020   Procedure: ABDOMINAL AORTOGRAM W/LOWER EXTREMITY;  Surgeon: Elam Dutch, MD;  Location: [place] [facility];  Service: Cardiovascular;  Laterality: N/A;   APPENDECTOMY     BIOPSY  02/20/2020   Procedure: BIOPSY;  Surgeon: Rush Landmark Telford Nab., MD;  Location: [place];  Service: Gastroenterology;;   BRONCHIAL BIOPSY  09/04/2021   Procedure: BRONCHIAL BIOPSIES;  Surgeon: Garner Nash, DO;  Location: [place];  Service: Pulmonary;;  BRONCHIAL BRUSHINGS  09/04/2021   Procedure: BRONCHIAL BRUSHINGS;  Surgeon: Garner Nash, DO;  Location: [place];  Service: Pulmonary;;   BRONCHIAL NEEDLE ASPIRATION BIOPSY  09/04/2021   Procedure: BRONCHIAL NEEDLE ASPIRATION BIOPSIES;  Surgeon: Garner Nash, DO;  Location: [place];  Service: Pulmonary;;   CATARACT EXTRACTION, BILATERAL     ENDARTERECTOMY FEMORAL Right 04/19/2019   Procedure: ENDARTERECTOMY RIGHT COMMON FEMORAL;  Surgeon: Elam Dutch, MD;  Location: [facility] OR;  Service: Vascular;  Laterality: Right;   ENDARTERECTOMY FEMORAL Left 01/24/2020   Procedure: LEFT FEMORAL ENDARTERECTOMY  WITH DACRON PATCH ANGIOPLASTY;  Surgeon: Elam Dutch, MD;  Location: [facility] OR;  Service: Vascular;  Laterality: Left;   ESOPHAGOGASTRODUODENOSCOPY (EGD) WITH PROPOFOL N/A 02/20/2020   Procedure: ESOPHAGOGASTRODUODENOSCOPY (EGD) WITH PROPOFOL;  Surgeon: Irving Copas., MD;  Location: [place];  Service: Gastroenterology;  Laterality: N/A;   FEMORAL ENDARTERECTOMY Left 01/24/2020   FIDUCIAL MARKER PLACEMENT  09/04/2021   Procedure: FIDUCIAL MARKER PLACEMENT;  Surgeon: Garner Nash, DO;  Location: [place];  Service: Pulmonary;;   HEMOSTASIS CLIP PLACEMENT  02/20/2020   Procedure: HEMOSTASIS CLIP PLACEMENT;  Surgeon: Irving Copas., MD;  Location: [place];  Service: Gastroenterology;;   HOT HEMOSTASIS N/A 02/20/2020   Procedure: HOT HEMOSTASIS (ARGON PLASMA COAGULATION/BICAP);  Surgeon: Irving Copas., MD;  Location: [place];  Service: Gastroenterology;  Laterality: N/A;   INSERTION OF ILIAC STENT Left 01/24/2020   Procedure: INSERTION OF VBX STENT 8X59 AND 8X39 LEFT COMMON ILIAC ARTERY. INSERTION OF INNOVA 7 X 60 INNOVA STENT LEFT EXTERNAL ILIAC ARTERY. ;  Surgeon: Elam Dutch, MD;  Location: [place];  Service: Vascular;  Laterality: Left;   LOWER EXTREMITY ANGIOGRAPHY  12/11/2018   LOWER EXTREMITY ANGIOGRAPHY N/A 12/11/2018   Procedure: LOWER EXTREMITY ANGIOGRAPHY;  Surgeon: Elam Dutch, MD;  Location: [place] [facility];  Service: Cardiovascular;  Laterality: N/A;   PATCH ANGIOPLASTY Right 04/19/2019   Procedure: Patch Angioplasty;  Surgeon: Elam Dutch, MD;  Location: [facility] OR;  Service: Vascular;  Laterality: Right;   PERIPHERAL VASCULAR INTERVENTION Right 12/11/2018   Procedure: PERIPHERAL VASCULAR INTERVENTION;  Surgeon: Elam Dutch, MD;  Location: [place] [facility];  Service: Cardiovascular;  Laterality: Right;  Common Iliac    SKIN CANCER EXCISION     "lips, face, ears, arms" (04/07/2018)   TONSILLECTOMY     ULTRASOUND GUIDANCE  FOR VASCULAR ACCESS Right 01/24/2020   Procedure: ULTRASOUND GUIDANCE FOR VASCULAR ACCESS;  Surgeon: Elam Dutch, MD;  Location: [place];  Service: Vascular;  Laterality: Right;   VIDEO BRONCHOSCOPY WITH RADIAL ENDOBRONCHIAL ULTRASOUND  09/04/2021   Procedure: VIDEO BRONCHOSCOPY WITH RADIAL ENDOBRONCHIAL ULTRASOUND;  Surgeon: Garner Nash, DO;  Location: [place] [facility];  Service: Pulmonary;;     A IV Location/Drains/Wounds Patient Lines/Drains/Airways Status     Active Line/Drains/Airways     Name Placement date Placement time Site Days   Peripheral IV 08/09/22 20 G Right Antecubital 08/09/22  1329  Antecubital  less than 1            Intake/Output Last 24 hours No intake or output data in the 24 hours ending 08/09/22 1903  Labs/Imaging Results for orders placed or performed during the hospital encounter of 08/09/22 (from the past 48 hour(s))  CBC with Differential     Status: Abnormal   Collection Time: 08/09/22  1:30 PM  Result Value Ref Range   WBC 10.7 (H) 4.0 - 10.5 K/uL   RBC  3.49 (L) 4.22 - 5.81 MIL/uL   Hemoglobin 11.0 (L) 13.0 - 17.0 g/dL   HCT 34.7 (L) 39.0 - 52.0 %   MCV 99.4 80.0 - 100.0 fL   MCH 31.5 26.0 - 34.0 pg   MCHC 31.7 30.0 - 36.0 g/dL   RDW 13.2 11.5 - 15.5 %   Platelets 307 150 - 400 K/uL   nRBC 0.0 0.0 - 0.2 %   Neutrophils Relative % 88 %   Neutro Abs 9.5 (H) 1.7 - 7.7 K/uL   Lymphocytes Relative 4 %   Lymphs Abs 0.5 (L) 0.7 - 4.0 K/uL   Monocytes Relative 5 %   Monocytes Absolute 0.5 0.1 - 1.0 K/uL   Eosinophils Relative 0 %   Eosinophils Absolute 0.0 0.0 - 0.5 K/uL   Basophils Relative 1 %   Basophils Absolute 0.1 0.0 - 0.1 K/uL   Immature Granulocytes 2 %   Abs Immature Granulocytes 0.18 (H) 0.00 - 0.07 K/uL    Comment: Performed at [facility] [address] [place], [place] [address]  Comprehensive metabolic panel     Status: Abnormal   Collection Time: 08/09/22  1:30 PM  Result Value Ref Range   Sodium 139 135 - 145  mmol/L   Potassium 4.7 3.5 - 5.1 mmol/L   Chloride 99 98 - 111 mmol/L   CO2 28 22 - 32 mmol/L   Glucose, Bld 108 (H) 70 - 99 mg/dL    Comment: Glucose reference range applies only to samples taken after fasting for at least 8 hours.   BUN 12 8 - 23 mg/dL   Creatinine, Ser 1.12 0.61 - 1.24 mg/dL   Calcium 9.2 8.9 - 10.3 mg/dL   Total Protein 6.2 (L) 6.5 - 8.1 g/dL   Albumin 3.2 (L) 3.5 - 5.0 g/dL   AST 23 15 - 41 U/L   ALT 18 0 - 44 U/L   Alkaline Phosphatase 90 38 - 126 U/L   Total Bilirubin 0.7 0.3 - 1.2 mg/dL   GFR, Estimated >60 >60 mL/min    Comment: (NOTE) Calculated using the CKD-EPI Creatinine Equation (2021)    Anion gap 12 5 - 15    Comment: Performed at [facility], [place] [address], [place], [place] [address]  Protime-INR     Status: None   Collection Time: 08/09/22  1:30 PM  Result Value Ref Range   Prothrombin Time 14.8 11.4 - 15.2 seconds   INR 1.2 0.8 - 1.2    Comment: (NOTE) INR goal varies based on device and disease states. Performed at [facility], [place] [address] [place], [place] [address]   Type and screen [place]     Status: None   Collection Time: 08/09/22  1:30 PM  Result Value Ref Range   ABO/RH(D) A POS    Antibody Screen NEG    Sample Expiration      08/12/2022,2359 Performed at [facility], [place] [address], [place], [place] [address]   POC occult blood, ED     Status: Abnormal   Collection Time: 08/09/22  2:51 PM  Result Value Ref Range   Fecal Occult Bld POSITIVE (A) NEGATIVE   CT L-SPINE NO CHARGE  Result Date: 08/09/2022 CLINICAL DATA:  Abdominal pain.  Status post fall EXAM: CT LUMBAR SPINE WITHOUT CONTRAST TECHNIQUE: Multidetector CT imaging of the lumbar spine was performed without intravenous contrast administration. Multiplanar CT image reconstructions were also generated. RADIATION DOSE  REDUCTION: This exam was performed according to the departmental dose-optimization program which includes  automated exposure control, adjustment of the mA and/or kV according to patient size and/or use of iterative reconstruction technique. COMPARISON:  Abdominal MRI from 07/27/2022 and CT chest 06/21/2022. FINDINGS: Segmentation: 5 lumbar type vertebrae. Alignment: Mild curvature of the lumbar spine is convex towards the left. Vertebrae: Acute to subacute fracture deformity is identified involving the L2 vertebral body. This is new when compared with the MRI from 07/27/2022. Loss of approximately 50% of the central vertebral body height is identified. Mild retropulsion is identified along the posterior aspect of the superior endplate, image 56/4. L3 mild inferior endplate compression deformity is unchanged when compared with the MRI from 07/27/22 with loss of approximately 10% of the central vertebral body height. Chronic fracture deformity involving the L4 vertebral body is again noted with loss of approximately 70% of the central vertebral body height. Paraspinal and other soft tissues: Negative. Disc levels: Disc spaces are relatively well preserved. No significant degenerative disc disease. IMPRESSION: 1. Acute to subacute fracture deformity of the L2 vertebral body is new when compared with the MRI from 07/27/2022. Loss of approximately 50% of the central vertebral body height is identified. Mild retropulsion is identified along the posterior aspect of the superior endplate. 2. Chronic fracture deformity involving the L4 vertebral body is again noted with loss of approximately 70% of the central vertebral body height. 3. Age-indeterminate L3 inferior endplate compression deformity is unchanged when compared with the MRI from 07/27/22. Electronically Signed   By: Kerby Moors M.D.   On: 08/09/2022 18:01   CT ABDOMEN PELVIS W CONTRAST  Result Date: 08/09/2022 CLINICAL DATA:  Acute abdominal pain EXAM: CT ABDOMEN AND PELVIS WITH CONTRAST TECHNIQUE: Multidetector CT imaging of the abdomen and pelvis was performed  using the standard protocol following bolus administration of intravenous contrast. RADIATION DOSE REDUCTION: This exam was performed according to the departmental dose-optimization program which includes automated exposure control, adjustment of the mA and/or kV according to patient size and/or use of iterative reconstruction technique. CONTRAST:  79mL OMNIPAQUE IOHEXOL 350 MG/ML SOLN COMPARISON:  PET-CT 08/08/2021. MRI abdomen 07/27/2022. Lumbar spine MRI 06/06/2020. FINDINGS: Lower chest: Emphysema present Hepatobiliary: No focal liver abnormality is seen. No gallstones, gallbladder wall thickening, or biliary dilatation. Pancreas: There is a 1.7 x 1.7 cm low-attenuation/cystic area in the tail the pancreas which appears stable. No acute inflammation or ductal dilatation. Spleen: Normal in size without focal abnormality. Adrenals/Urinary Tract: Right inferior pole cyst measures 15 mm. Otherwise, the kidneys, adrenal glands and bladder are within normal limits. Stomach/Bowel: Stomach is within normal limits. Appendix is not seen. No evidence of bowel wall thickening, distention, or inflammatory changes. Vascular/Lymphatic: Aortic atherosclerosis. No enlarged abdominal or pelvic lymph nodes. Reproductive: Prostate gland is mildly enlarged. Other: Left inguinal hernia containing colon is moderate in size and has increased from prior study. There is a small right inguinal hernia containing bladder, unchanged. There is no ascites. Musculoskeletal: Chronic compression fracture of L4 is unchanged. There is a acute appearing compression fracture of L2 with mild retropulsion of fracture fragments (30% vertebral body height loss). IMPRESSION: 1. Acute appearing compression fracture of L2 with mild retropulsion of fracture fragments. Please correlate clinically. 2. Left inguinal hernia containing colon has increased in size. No bowel obstruction. 3. Stable right inguinal hernia containing bladder. 4. 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 Aortic Atherosclerosis (ICD10-I70.0). Electronically Signed   By: Tina Griffiths.D.  On: 08/09/2022 17:56    Pending Labs Unresulted Labs (From admission, onward)     Start     Ordered   08/10/22 0500  Comprehensive metabolic panel  Tomorrow morning,   R        08/09/22 1847   08/10/22 0500  CBC  Tomorrow morning,   R        08/09/22 1847            Vitals/Pain Today's Vitals   08/09/22 1400 08/09/22 1659 08/09/22 1700 08/09/22 1815  BP: 109/88  (!) 160/84   Pulse: (!) 104  (!) 113 (!) 107  Resp: 15  16 15   Temp:  98.1 F (36.7 C)    TempSrc:  Oral    SpO2: 100%  100% 100%  Weight:      Height:      PainSc:        Isolation Precautions No active isolations  Medications Medications  oxyCODONE (Oxy IR/ROXICODONE) immediate release tablet 5 mg (has no administration in time range)  rosuvastatin (CRESTOR) tablet 10 mg (has no administration in time range)  pantoprazole (PROTONIX) EC tablet 20 mg (has no administration in time range)  ferrous sulfate tablet 325 mg (has no administration in time range)  roflumilast (DALIRESP) tablet 500 mcg (has no administration in time range)  Tiotropium Bromide Monohydrate AERS (has no administration in time range)  mometasone-formoterol (DULERA) 200-5 MCG/ACT inhaler 2 puff (has no administration in time range)  sodium chloride flush (NS) 0.9 % injection 3 mL (has no administration in time range)  acetaminophen (TYLENOL) tablet 650 mg (has no administration in time range)    Or  acetaminophen (TYLENOL) suppository 650 mg (has no administration in time range)  albuterol (PROVENTIL) (2.5 MG/3ML) 0.083% nebulizer solution 2.5 mg (has no administration in time range)  sodium chloride 0.9 % bolus 500 mL (0 mLs Intravenous Stopped 08/09/22 1537)  fentaNYL (SUBLIMAZE) injection 50 mcg (50 mcg Intravenous Given 08/09/22 1504)  iohexol (OMNIPAQUE) 350 MG/ML injection 75 mL (75 mLs Intravenous Contrast Given 08/09/22 1749)     Mobility walks with device     Focused Assessments    R Recommendations: See Admitting Provider Note  Report given to:   Additional Notes:   4L [place]  at baseline. DNR

## 2022-08-09 NOTE — Progress Notes (Signed)
Francisco [name] [facility]: [phone]   Routine Medical Office Visit  Patient:  Francisco [name]      Age: 77 y.o.       Sex:  male  Date:   08/09/2022  PCP:    [name], [place] Provider: Loralee Pacas, MD   Problem Focused Charting:   Medical Decision Making per Assessment/Plan   Dovber was seen today for melena, wound on arm from a fall and falling asleep often.  Acute GI bleeding Overview: Melena with hemoglobin down to 10 February 2020.  EGD with intervention-follow-up with Dr. Rush Landmark  Assessment & Plan: Seems to be recurring based on recent fall that is not otherwise explained, conjunctival pallor, melena x 1 week and increased fatigue.  Recommend going to emergency room for stat check on the blood count and probably urgent endoscopy because he is on Eliquis he agreed and they will directly transport to the main [facility] without EMS        Subjective - Clinical Presentation:   Francisco [name] is a 77 y.o. male  Patient Active Problem List   Diagnosis Date Noted   Chronic systolic congestive heart failure ([place]) 12/10/2021   AVM (arteriovenous malformation) of small bowel, acquired 11/21/2020   Iron deficiency anemia due to chronic blood loss 08/23/2020   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   History of upper GI bleeding 04/30/2020   Hiatal hernia 04/30/2020   Chronic anticoagulation 04/30/2020   Anemia 04/30/2020   Malignant neoplasm of lower lobe of right lung ([place])    Acute GI bleeding 02/18/2020   PAD (peripheral artery disease) ([place]) 12/11/2018   Pre-operative respiratory examination 12/10/2018   Stage 4 very severe COPD by GOLD classification ([place]) 11/27/2018   DNR (do not resuscitate) 64/40/3474   Diastolic dysfunction 25/95/6387   Chronic pulmonary embolism ([place]) 04/07/2018   Essential tremor 03/31/2018   BPPV (benign paroxysmal positional vertigo), right 02/10/2018   Osteoporosis 11/19/2017    Thoracic compression fracture ([place]) 11/02/2017   Essential hypertension 11/02/2017   Venous stasis dermatitis of both lower extremities 11/02/2017   Chronic respiratory failure with hypoxia ([place]) 09/23/2017   BPH associated with nocturia 06/25/2017   Leukocytosis 12/19/2016   Hyperglycemia 06/12/2016   Hyperlipidemia 07/22/2014   Onychomycosis 07/22/2014   Left ankle swelling 07/22/2014   History of skin cancer 05/17/2014   Chronic rhinitis 10/28/2012   Pulmonary nodule 09/23/2011   Former smoker 08/23/2008   History of colonic polyps 08/23/2008   Past Medical History:  Diagnosis Date   Chronic back pain    "mid and lower" (04/07/2018)   Chronic rhinitis    -Sinus Ct 08/01/2009 >> Bilateral maxillary sinusitis with some mucosal thickeningin the sphenoid and frontal sinuses as well with air fluid levels present -chronic rhinitis flyer Aug 04, 2009   Compressed spine fracture [facility])    COPD (chronic obstructive pulmonary disease) ([place])    PFT's rec Jul 17, 2009   Dyspnea    Emphysema lung ([place])    On home oxygen therapy    "3L; 24/7" (04/07/2018)   Onychomycosis    Dr. Judi Cong   Orthostatic hypotension    "since 10/2017" (04/07/2018)   PAD (peripheral artery disease) ([place])    Pneumonia    "twice in 1 year" (04/07/2018)   Pulmonary embolism ([place]) 04/07/2018   Skin cancer    "lips, face, ears, arms" (04/07/2018)   Small cell lung cancer, right ([place]) 2023  Dr. Lisbeth Renshaw.  rad   Vertigo    "since ~ 02/2018" (04/07/2018)    Outpatient Medications Prior to Visit  Medication Sig   acetaminophen (TYLENOL) 500 MG tablet Take 1,000 mg by mouth every 6 (six) hours as needed for moderate pain.    albuterol (VENTOLIN HFA) 108 (90 Base) MCG/ACT inhaler USE 2 INHALATIONS BY MOUTH  EVERY 6 HOURS AS NEEDED FOR WHEEZING   azithromycin (ZITHROMAX) 250 MG tablet Take 1 tablet (250 mg total) by mouth 3 (three) times a week.   Calcium Carbonate-Vitamin D (CALCIUM 600+D PO) Take 2 tablets by mouth daily.    doxycycline (VIBRA-TABS) 100 MG tablet Take 1 tablet (100 mg total) by mouth 2 (two) times daily.   ELIQUIS 2.5 MG TABS tablet TAKE 1 TABLET BY MOUTH TWICE  DAILY   Ferrous Sulfate (IRON) 325 (65 Fe) MG TABS Take 325 mg by mouth every other day.   fluticasone (FLONASE) 50 MCG/ACT nasal spray USE 2 SPRAYS IN BOTH  NOSTRILS DAILY   folic acid (FOLVITE) 1 MG tablet Take 1 tablet (1 mg total) by mouth daily.   furosemide (LASIX) 20 MG tablet TAKE 1 TABLET BY MOUTH  DAILY AS NEEDED FOR FLUID  OR EDEMA   hydrOXYzine (VISTARIL) 25 MG capsule Take 1 capsule (25 mg total) by mouth every 8 (eight) hours as needed.   magic mouthwash (nystatin, lidocaine, diphenhydrAMINE, alum & mag hydroxide) suspension Swish and spit 5 mLs 4 (four) times daily.   magic mouthwash SOLN Take 5 mLs by mouth 3 (three) times daily as needed for mouth pain.   Multiple Vitamin (MULTIVITAMIN WITH MINERALS) TABS tablet Take 1 tablet by mouth daily.   mupirocin cream (BACTROBAN) 2 % Apply 1 application topically 2 (two) times daily. (Patient taking differently: Apply 1 application  topically daily as needed (Cyst).)   oxyCODONE (OXY IR/ROXICODONE) 5 MG immediate release tablet Take 1 tablet (5 mg total) by mouth every 4 (four) hours as needed for severe pain.   oxycodone (OXY-IR) 5 MG capsule Take 1 capsule (5 mg total) by mouth every 6 (six) hours as needed (arm pain after fall- particularly with dressing changes due to sticking issue).   OXYGEN Inhale 4-5 L into the lungs continuous.    pantoprazole (PROTONIX) 20 MG tablet TAKE 1 TABLET BY MOUTH  DAILY   Polyethyl Glycol-Propyl Glycol (SYSTANE) 0.4-0.3 % SOLN Place 1 drop into both eyes every other day.   predniSONE (DELTASONE) 10 MG tablet Take 1 tablet (10 mg total) by mouth daily with breakfast.   Respiratory Therapy Supplies (FLUTTER) DEVI 10 times Twice a day and prn as needed, may increase if feeling worse   roflumilast (DALIRESP) 500 MCG TABS tablet TAKE 1 TABLET BY MOUTH   DAILY   rosuvastatin (CRESTOR) 10 MG tablet TAKE 1 TABLET BY MOUTH DAILY   SPIRIVA RESPIMAT 2.5 MCG/ACT AERS USE 2 INHALATIONS BY MOUTH  DAILY   SYMBICORT 160-4.5 MCG/ACT inhaler USE 2 INHALATIONS BY MOUTH  TWICE DAILY   triamcinolone cream (KENALOG) 0.1 % Apply 1 application. topically 2 (two) times daily. For 7-10 days maximum   No facility-administered medications prior to visit.    Chief Complaint  Patient presents with   Melena    Since 1/26. History of bleeding in abdomen- in 2021 or 2022 (clamped off area that was bleeding. Takes iron pills every other day-makes stools dark but not usually black. No BM since 1/30   Wound on arm from a fall    On  1/20-bruised and was bleeding-informed Dr.Hunter and was prescribed a antibiotic if needed  for possible infection (has not taken it).   Falling asleep often    HPI Prsg with wife sue and son derrick   On January 20 he fell and took oxycodone 5 mg every 4 hours for 2 days.  Then he took a stool softener also.  Stopped oxycodone and started taking tramadol for 2 days along with stool softener.  Had no bowel movement.  Took a Hardin Negus laxative caplets twice with no bowel movement.  Late January 26 took a bottle of citrate saline laxative and bowel moved within 6 hours.  Had bowel movements on January 26, January 27, January 28, January 29, January 30 but has not pooped since January 30.  Taking Tylenol about every 6 hours.  Stools are black.  Does take an iron pill every other day.  It is just is just there has been a little touch of red where he has had to wipe so many times its gotten overall but denies any bright red blood per rectum  All of these stools are melanotic.  He has symptom(s) of shortness of breath chronic unchanged.  But fatigue is worse and more nodding in last week.  The fall he doesn't know why he fell or if he passed out.        Objective:  Physical Exam  BP 110/70 (BP Location: Left Arm, Patient Position: Sitting)    Pulse (!) 115   Temp 98.2 F (36.8 C) (Temporal)   Ht 6' (1.829 m)   SpO2 (!) 86%   BMI 24.68 kg/m  Well developed, well nourished  by BMI criteria but truncal adiposity (waist circumference or caliper) should be used instead. Wt Readings from Last 10 Encounters:  06/06/22 182 lb (82.6 kg)  05/02/22 178 lb (80.7 kg)  03/07/22 179 lb 3.2 oz (81.3 kg)  01/29/22 174 lb (78.9 kg)  12/10/21 174 lb (78.9 kg)  12/05/21 174 lb 9.6 oz (79.2 kg)  09/20/21 183 lb (83 kg)  09/18/21 183 lb (83 kg)  09/11/21 185 lb (83.9 kg)  09/04/21 180 lb (81.6 kg)   Vital signs reviewed.  Nursing notes reviewed. Weight trend reviewed. General Appearance:  Well developed, well nourished male in no acute distress.   Normal work of breathing at rest Musculoskeletal: All extremities are intact.  Neurological:  Awake, alert,  No obvious focal neurological deficits or cognitive impairments Psychiatric:  Appropriate mood, pleasant demeanor Problem-specific findings:  conjunctival pallor   Results Reviewed: No results found for any visits on 08/09/22.  Recent Results (from the past 2160 hour(s))  CBC with Differential/Platelet     Status: Abnormal   Collection Time: 06/06/22 10:06 AM  Result Value Ref Range   WBC 11.0 (H) 4.0 - 10.5 K/uL   RBC 3.78 (L) 4.22 - 5.81 Mil/uL   Hemoglobin 12.2 (L) 13.0 - 17.0 g/dL   HCT 36.4 (L) 39.0 - 52.0 %   MCV 96.3 78.0 - 100.0 fl   MCHC 33.5 30.0 - 36.0 g/dL   RDW 13.1 11.5 - 15.5 %   Platelets 251.0 150.0 - 400.0 K/uL   Neutrophils Relative % 89.1 Repeated and verified X2. (H) 43.0 - 77.0 %   Lymphocytes Relative 4.3 (L) 12.0 - 46.0 %   Monocytes Relative 5.3 3.0 - 12.0 %   Eosinophils Relative 0.9 0.0 - 5.0 %   Basophils Relative 0.4 0.0 - 3.0 %   Neutro Abs 9.8 (H) 1.4 -  7.7 K/uL   Lymphs Abs 0.5 (L) 0.7 - 4.0 K/uL   Monocytes Absolute 0.6 0.1 - 1.0 K/uL   Eosinophils Absolute 0.1 0.0 - 0.7 K/uL   Basophils Absolute 0.0 0.0 - 0.1 K/uL  CBC with Differential  ([facility] Only)     Status: Abnormal   Collection Time: 06/21/22  2:32 PM  Result Value Ref Range   WBC Count 10.3 4.0 - 10.5 K/uL   RBC 3.69 (L) 4.22 - 5.81 MIL/uL   Hemoglobin 12.0 (L) 13.0 - 17.0 g/dL   HCT 36.7 (L) 39.0 - 52.0 %   MCV 99.5 80.0 - 100.0 fL   MCH 32.5 26.0 - 34.0 pg   MCHC 32.7 30.0 - 36.0 g/dL   RDW 13.2 11.5 - 15.5 %   Platelet Count 178 150 - 400 K/uL   nRBC 0.0 0.0 - 0.2 %   Neutrophils Relative % 88 %   Neutro Abs 9.1 (H) 1.7 - 7.7 K/uL   Lymphocytes Relative 5 %   Lymphs Abs 0.5 (L) 0.7 - 4.0 K/uL   Monocytes Relative 5 %   Monocytes Absolute 0.5 0.1 - 1.0 K/uL   Eosinophils Relative 1 %   Eosinophils Absolute 0.1 0.0 - 0.5 K/uL   Basophils Relative 0 %   Basophils Absolute 0.0 0.0 - 0.1 K/uL   Immature Granulocytes 1 %   Abs Immature Granulocytes 0.08 (H) 0.00 - 0.07 K/uL    Comment: Performed at [facility], [place] [address] [place], [place] [address]  CMP ([place] only)     Status: Abnormal   Collection Time: 06/21/22  2:32 PM  Result Value Ref Range   Sodium 142 135 - 145 mmol/L   Potassium 5.2 (H) 3.5 - 5.1 mmol/L   Chloride 101 98 - 111 mmol/L   CO2 34 (H) 22 - 32 mmol/L   Glucose, Bld 114 (H) 70 - 99 mg/dL    Comment: Glucose reference range applies only to samples taken after fasting for at least 8 hours.   BUN 14 8 - 23 mg/dL   Creatinine 1.06 0.61 - 1.24 mg/dL   Calcium 9.9 8.9 - 10.3 mg/dL   Total Protein 6.9 6.5 - 8.1 g/dL   Albumin 4.1 3.5 - 5.0 g/dL   AST 23 15 - 41 U/L   ALT 19 0 - 44 U/L   Alkaline Phosphatase 73 38 - 126 U/L   Total Bilirubin 0.5 0.3 - 1.2 mg/dL   GFR, Estimated >60 >60 mL/min    Comment: (NOTE) Calculated using the CKD-EPI Creatinine Equation (2021)    Anion gap 7 5 - 15    Comment: Performed at [facility], [place] [address], [place], [place] [address]          Signed: Loralee Pacas, MD 08/09/2022 11:48 AM

## 2022-08-09 NOTE — ED Triage Notes (Signed)
Pt sent by PCP for having black tarry stool x5 days. Pt denies N/V/D. Pt states he had a fall for unknown reasons prior to symptoms starting with + LOC, back pain, and fatigue. Pt is on anticoagulants. Pt denies confusion, neuro symptoms following the fall. Pt is A/Ox4 during triage and is on 4L O2 via N/C at baseline.

## 2022-08-09 NOTE — ED Provider Notes (Signed)
Francisco [name]   CSN: [id number] Arrival date & time: 08/09/22  1217     History  Chief Complaint  Patient presents with   GI Bleeding    Francisco [name] is a 77 y.o. male.  Patient here for evaluate of dark stools.  History of blood clots on Eliquis.  He does take iron pills.  He has been dealing with some low back pain since a fall a couple weeks ago.  No recent falls.  He has been constipated.  Taking some over-the-counter medications with minimal relief.  2 days ago he had a dark stool.  He was seen by his primary care doctor today who thought that he looked pale and sent in for evaluation.  He has not had a bowel movement in 2 days.  He is not having a lot of abdominal pain pain is mostly in the low back.  Denies any nausea or vomiting.  Occasional alcohol use.  History of GI bleed in the past.  The history is provided by the patient.       Home Medications Prior to Admission medications   Medication Sig Start Date End Date Taking? Authorizing Provider  acetaminophen (TYLENOL) 500 MG tablet Take 1,000 mg by mouth every 6 (six) hours as needed for moderate pain.     [provider]  albuterol (VENTOLIN HFA) 108 (90 Base) MCG/ACT inhaler USE 2 INHALATIONS BY MOUTH  EVERY 6 HOURS AS NEEDED FOR WHEEZING 12/31/21   Brand Males, MD  azithromycin (ZITHROMAX) 250 MG tablet Take 1 tablet (250 mg total) by mouth 3 (three) times a week. 07/31/22   Brand Males, MD  Calcium Carbonate-Vitamin D (CALCIUM 600+D PO) Take 2 tablets by mouth daily.    [provider]  doxycycline (VIBRA-TABS) 100 MG tablet Take 1 tablet (100 mg total) by mouth 2 (two) times daily. 07/30/22   [name], MD  ELIQUIS 2.5 MG TABS tablet TAKE 1 TABLET BY MOUTH TWICE  DAILY 05/15/22   [name], MD  Ferrous Sulfate (IRON) 325 (65 Fe) MG TABS Take 325 mg by mouth every other day.    [provider]  fluticasone  (FLONASE) 50 MCG/ACT nasal spray USE 2 SPRAYS IN BOTH  NOSTRILS DAILY 03/18/22   [name], MD  folic acid (FOLVITE) 1 MG tablet Take 1 tablet (1 mg total) by mouth daily. 02/26/20   Mikhail, Velta Addison, DO  furosemide (LASIX) 20 MG tablet TAKE 1 TABLET BY MOUTH  DAILY AS NEEDED FOR FLUID  OR EDEMA 12/17/21   [name], MD  hydrOXYzine (VISTARIL) 25 MG capsule Take 1 capsule (25 mg total) by mouth every 8 (eight) hours as needed. 01/29/22   Jeanie Sewer, NP  magic mouthwash (nystatin, lidocaine, diphenhydrAMINE, alum & mag hydroxide) suspension Swish and spit 5 mLs 4 (four) times daily. 11/28/21   Tawnya Crook, MD  magic mouthwash SOLN Take 5 mLs by mouth 3 (three) times daily as needed for mouth pain. 11/27/21   Tawnya Crook, MD  Multiple Vitamin (MULTIVITAMIN WITH MINERALS) TABS tablet Take 1 tablet by mouth daily. 02/26/20   Mikhail, Velta Addison, DO  mupirocin cream (BACTROBAN) 2 % Apply 1 application topically 2 (two) times daily. Patient taking differently: Apply 1 application  topically daily as needed (Cyst). 08/21/20   [name], MD  oxyCODONE (OXY IR/ROXICODONE) 5 MG immediate release tablet Take 1 tablet (5 mg total) by mouth every  4 (four) hours as needed for severe pain. 10/23/21   Hayden Pedro, PA-C  oxycodone (OXY-IR) 5 MG capsule Take 1 capsule (5 mg total) by mouth every 6 (six) hours as needed (arm pain after fall- particularly with dressing changes due to sticking issue). 05/02/22   [name], MD  OXYGEN Inhale 4-5 L into the lungs continuous.     [provider]  pantoprazole (PROTONIX) 20 MG tablet TAKE 1 TABLET BY MOUTH  DAILY 02/04/22   [name], MD  Polyethyl Glycol-Propyl Glycol (SYSTANE) 0.4-0.3 % SOLN Place 1 drop into both eyes every other day.    [provider]  predniSONE (DELTASONE) 10 MG tablet Take 1 tablet (10 mg total) by mouth daily with breakfast. 03/19/22   Brand Males, MD  Respiratory Therapy  Supplies (FLUTTER) DEVI 10 times Twice a day and prn as needed, may increase if feeling worse 10/15/19   Lauraine Rinne, NP  roflumilast (DALIRESP) 500 MCG TABS tablet TAKE 1 TABLET BY MOUTH  DAILY 05/03/22   Brand Males, MD  rosuvastatin (CRESTOR) 10 MG tablet TAKE 1 TABLET BY MOUTH DAILY 05/15/22   [name], MD  SPIRIVA RESPIMAT 2.5 MCG/ACT AERS USE 2 INHALATIONS BY MOUTH  DAILY 12/04/21   Brand Males, MD  SYMBICORT 160-4.5 MCG/ACT inhaler USE 2 INHALATIONS BY MOUTH  TWICE DAILY 08/14/21   [name], MD  triamcinolone cream (KENALOG) 0.1 % Apply 1 application. topically 2 (two) times daily. For 7-10 days maximum 12/10/21   [name], MD      Allergies    Tape, Amoxicillin-pot clavulanate, Ciprofloxacin, Gadavist [gadobutrol], Levaquin [levofloxacin], and Lipitor [atorvastatin]    Review of Systems   Review of Systems  Physical Exam Updated Vital Signs  ED Triage Vitals  Enc Vitals Group     BP 08/09/22 1251 124/75     Pulse Rate 08/09/22 1251 (!) 118     Resp 08/09/22 1251 20     Temp 08/09/22 1251 98.2 F (36.8 C)     Temp Source 08/09/22 1251 Oral     SpO2 08/09/22 1251 (!) 65 %     Weight 08/09/22 1249 184 lb (83.5 kg)     Height 08/09/22 1249 6' (1.829 m)     Head Circumference --      Peak Flow --      Pain Score 08/09/22 1249 6     Pain Loc --      Pain Edu? --      Excl. in [place]? --     Physical Exam Vitals and nursing [name] reviewed.  Constitutional:      General: He is not in acute distress.    Appearance: He is well-developed. He is not ill-appearing.  HENT:     Head: Normocephalic and atraumatic.     Nose: Nose normal.  Eyes:     Extraocular Movements: Extraocular movements intact.     Conjunctiva/sclera: Conjunctivae normal.     Pupils: Pupils are equal, round, and reactive to light.  Cardiovascular:     Rate and Rhythm: Normal rate and regular rhythm.     Pulses: Normal pulses.     Heart sounds: No murmur heard. Pulmonary:      Effort: Pulmonary effort is normal. No respiratory distress.     Breath sounds: Normal breath sounds.  Abdominal:     Palpations: Abdomen is soft.     Tenderness: There is abdominal tenderness.  Genitourinary:    Comments:  Stool looks grossly brown Musculoskeletal:        General: No swelling.     Cervical back: Normal range of motion and neck supple.  Skin:    General: Skin is warm and dry.     Capillary Refill: Capillary refill takes less than 2 seconds.  Neurological:     General: No focal deficit present.     Mental Status: He is alert.  Psychiatric:        Mood and Affect: Mood normal.     ED Results / Procedures / Treatments   Labs (all labs ordered are listed, but only abnormal results are displayed) Labs Reviewed  CBC WITH DIFFERENTIAL/PLATELET - Abnormal; Notable for the following components:      Result Value   WBC 10.7 (*)    RBC 3.49 (*)    Hemoglobin 11.0 (*)    HCT 34.7 (*)    Neutro Abs 9.5 (*)    Lymphs Abs 0.5 (*)    Abs Immature Granulocytes 0.18 (*)    All other components within normal limits  COMPREHENSIVE METABOLIC PANEL - Abnormal; Notable for the following components:   Glucose, Bld 108 (*)    Total Protein 6.2 (*)    Albumin 3.2 (*)    All other components within normal limits  POC OCCULT BLOOD, ED - Abnormal; Notable for the following components:   Fecal Occult Bld POSITIVE (*)    All other components within normal limits  PROTIME-INR  TYPE AND SCREEN    EKG None  Radiology No results found.  Procedures Procedures    Medications Ordered in ED Medications  sodium chloride 0.9 % bolus 500 mL (500 mLs Intravenous New Bag/Given 08/09/22 1343)    ED Course/ Medical Decision Making/ A&P                             Medical Decision Making Amount and/or Complexity of Data Reviewed Labs: ordered. Radiology: ordered.   Francisco [name] is here with concern for GI bleed.  Patient arrives unremarkable vitals.  He is on his home 4  L of oxygen.  He has had some dark stools the last 5 days the last episode being 2 days ago.  Primary care doctor sent for blood work and evaluation because he looked pale at the office today.  Stool looks grossly brown on exam.  Overall he has been having some low back pain and constipation from that possibly.  He denies any numbness or weakness in his legs.  Denies any cauda equina symptoms.  Fall was over 2 weeks ago.  Did not have any images done at that time.  No recent falls since then.  Did not hit his head.  Not having any headaches.  Differential diagnosis possibly muscle spasm/acute on chronic back pain versus may be compression fracture as far as back pain is concerned.  Does not appear to have melena on exam but will check blood work to see what his hemoglobin is compared to prior hemoglobins.  I suspect his Hemoccult will be positive because there was a little bit of redness on my glove but stool was grossly brown.  Will get a CT scan abdomen and pelvis to make sure there is no intra-abdominal process going on including bowel obstruction.  Did not seem to have a lot of stool in his rectal vault and not sure if there is any kind of stool impaction.  Will check  basic labs as well.  Per my review and interpretation of labs hemoglobin is 11 this is pretty much his baseline.  No significant leukocytosis or electrolyte abnormality or kidney injury otherwise.  Hemoccult positive stool was grossly brown.  I have very low suspicion for GI bleed.  Given abdominal pain and back pain, history of compression fractures will get a CT scan of his abdomen pelvis and lumbar spine to further evaluate.  He did not have any obvious fecal impaction on exam.  Please see oncoming ED staff's [name] for further results, evaluation, disposition of the patient.  This chart was dictated using voice recognition software.  Despite best efforts to proofread,  errors can occur which can change the documentation meaning.          Final Clinical Impression(s) / ED Diagnoses Final diagnoses:  Constipation, unspecified constipation type  Acute low back pain, unspecified back pain laterality, unspecified whether sciatica present    Rx / DC Orders ED Discharge Orders     None         Lennice Sites, DO 08/09/22 1459

## 2022-08-09 NOTE — ED Provider Notes (Addendum)
Pt sent from PCP office for admission re recent black stooks, gen weakness, on eliquis, mild drop in hgb, and recent fall with back pain/compression fx.  Mildly tachy, bp ok.  Hospitalists consulted for admission - discussed pt - will admit. GI also consulted at hospitalist request - paged, will discuss. Discussed pt with Dr Hilarie Fredrickson - he indicates they will see in AM.      Lajean Saver, MD 08/09/22 [phone]

## 2022-08-10 ENCOUNTER — Observation Stay (HOSPITAL_COMMUNITY): Payer: Medicare Other

## 2022-08-10 ENCOUNTER — Inpatient Hospital Stay (HOSPITAL_COMMUNITY): Payer: Medicare Other

## 2022-08-10 DIAGNOSIS — K922 Gastrointestinal hemorrhage, unspecified: Secondary | ICD-10-CM | POA: Diagnosis not present

## 2022-08-10 DIAGNOSIS — I878 Other specified disorders of veins: Secondary | ICD-10-CM | POA: Diagnosis present

## 2022-08-10 DIAGNOSIS — J439 Emphysema, unspecified: Secondary | ICD-10-CM | POA: Diagnosis present

## 2022-08-10 DIAGNOSIS — D62 Acute posthemorrhagic anemia: Secondary | ICD-10-CM | POA: Diagnosis present

## 2022-08-10 DIAGNOSIS — K625 Hemorrhage of anus and rectum: Secondary | ICD-10-CM | POA: Diagnosis present

## 2022-08-10 DIAGNOSIS — I739 Peripheral vascular disease, unspecified: Secondary | ICD-10-CM | POA: Diagnosis not present

## 2022-08-10 DIAGNOSIS — I5031 Acute diastolic (congestive) heart failure: Secondary | ICD-10-CM

## 2022-08-10 DIAGNOSIS — K2970 Gastritis, unspecified, without bleeding: Secondary | ICD-10-CM | POA: Diagnosis not present

## 2022-08-10 DIAGNOSIS — K31811 Angiodysplasia of stomach and duodenum with bleeding: Secondary | ICD-10-CM | POA: Diagnosis present

## 2022-08-10 DIAGNOSIS — I70203 Unspecified atherosclerosis of native arteries of extremities, bilateral legs: Secondary | ICD-10-CM | POA: Diagnosis present

## 2022-08-10 DIAGNOSIS — K921 Melena: Secondary | ICD-10-CM | POA: Diagnosis not present

## 2022-08-10 DIAGNOSIS — G250 Essential tremor: Secondary | ICD-10-CM | POA: Diagnosis present

## 2022-08-10 DIAGNOSIS — K31819 Angiodysplasia of stomach and duodenum without bleeding: Secondary | ICD-10-CM | POA: Diagnosis not present

## 2022-08-10 DIAGNOSIS — I2782 Chronic pulmonary embolism: Secondary | ICD-10-CM | POA: Diagnosis not present

## 2022-08-10 DIAGNOSIS — E785 Hyperlipidemia, unspecified: Secondary | ICD-10-CM | POA: Diagnosis present

## 2022-08-10 DIAGNOSIS — I2781 Cor pulmonale (chronic): Secondary | ICD-10-CM | POA: Diagnosis present

## 2022-08-10 DIAGNOSIS — I959 Hypotension, unspecified: Secondary | ICD-10-CM | POA: Diagnosis not present

## 2022-08-10 DIAGNOSIS — D72829 Elevated white blood cell count, unspecified: Secondary | ICD-10-CM | POA: Diagnosis present

## 2022-08-10 DIAGNOSIS — I10 Essential (primary) hypertension: Secondary | ICD-10-CM | POA: Diagnosis not present

## 2022-08-10 DIAGNOSIS — C3431 Malignant neoplasm of lower lobe, right bronchus or lung: Secondary | ICD-10-CM | POA: Diagnosis present

## 2022-08-10 DIAGNOSIS — K5900 Constipation, unspecified: Secondary | ICD-10-CM | POA: Diagnosis present

## 2022-08-10 DIAGNOSIS — J9621 Acute and chronic respiratory failure with hypoxia: Secondary | ICD-10-CM | POA: Diagnosis not present

## 2022-08-10 DIAGNOSIS — J9611 Chronic respiratory failure with hypoxia: Secondary | ICD-10-CM | POA: Diagnosis not present

## 2022-08-10 DIAGNOSIS — W19XXXA Unspecified fall, initial encounter: Secondary | ICD-10-CM | POA: Diagnosis present

## 2022-08-10 DIAGNOSIS — Z7901 Long term (current) use of anticoagulants: Secondary | ICD-10-CM

## 2022-08-10 DIAGNOSIS — Z87891 Personal history of nicotine dependence: Secondary | ICD-10-CM | POA: Diagnosis not present

## 2022-08-10 DIAGNOSIS — I110 Hypertensive heart disease with heart failure: Secondary | ICD-10-CM | POA: Diagnosis present

## 2022-08-10 DIAGNOSIS — K3182 Dieulafoy lesion (hemorrhagic) of stomach and duodenum: Secondary | ICD-10-CM | POA: Diagnosis present

## 2022-08-10 DIAGNOSIS — Z66 Do not resuscitate: Secondary | ICD-10-CM | POA: Diagnosis present

## 2022-08-10 DIAGNOSIS — I5043 Acute on chronic combined systolic (congestive) and diastolic (congestive) heart failure: Secondary | ICD-10-CM | POA: Diagnosis present

## 2022-08-10 DIAGNOSIS — M4856XA Collapsed vertebra, not elsewhere classified, lumbar region, initial encounter for fracture: Secondary | ICD-10-CM | POA: Diagnosis present

## 2022-08-10 DIAGNOSIS — Z79899 Other long term (current) drug therapy: Secondary | ICD-10-CM | POA: Diagnosis not present

## 2022-08-10 DIAGNOSIS — Z9981 Dependence on supplemental oxygen: Secondary | ICD-10-CM | POA: Diagnosis not present

## 2022-08-10 DIAGNOSIS — M545 Low back pain, unspecified: Secondary | ICD-10-CM | POA: Diagnosis not present

## 2022-08-10 DIAGNOSIS — N401 Enlarged prostate with lower urinary tract symptoms: Secondary | ICD-10-CM | POA: Diagnosis present

## 2022-08-10 DIAGNOSIS — I2729 Other secondary pulmonary hypertension: Secondary | ICD-10-CM | POA: Diagnosis present

## 2022-08-10 DIAGNOSIS — J441 Chronic obstructive pulmonary disease with (acute) exacerbation: Secondary | ICD-10-CM | POA: Diagnosis not present

## 2022-08-10 DIAGNOSIS — J449 Chronic obstructive pulmonary disease, unspecified: Secondary | ICD-10-CM | POA: Diagnosis not present

## 2022-08-10 LAB — CBC
HCT: 30.9 % — ABNORMAL LOW (ref 39.0–52.0)
HCT: 34.7 % — ABNORMAL LOW (ref 39.0–52.0)
Hemoglobin: 9.9 g/dL — ABNORMAL LOW (ref 13.0–17.0)
Hemoglobin: 11 g/dL — ABNORMAL LOW (ref 13.0–17.0)
MCH: 31.2 pg (ref 26.0–34.0)
MCH: 30.7 pg (ref 26.0–34.0)
MCHC: 32 g/dL (ref 30.0–36.0)
MCHC: 31.7 g/dL (ref 30.0–36.0)
MCV: 97.5 fL (ref 80.0–100.0)
MCV: 96.9 fL (ref 80.0–100.0)
Platelets: 249 10*3/uL (ref 150–400)
Platelets: 318 10*3/uL (ref 150–400)
RBC: 3.17 MIL/uL — ABNORMAL LOW (ref 4.22–5.81)
RBC: 3.58 MIL/uL — ABNORMAL LOW (ref 4.22–5.81)
RDW: 13.2 % (ref 11.5–15.5)
RDW: 13.1 % (ref 11.5–15.5)
WBC: 9.3 10*3/uL (ref 4.0–10.5)
WBC: 16 10*3/uL — ABNORMAL HIGH (ref 4.0–10.5)
nRBC: 0 % (ref 0.0–0.2)
nRBC: 0 % (ref 0.0–0.2)

## 2022-08-10 LAB — MAGNESIUM: Magnesium: 1.9 mg/dL (ref 1.7–2.4)

## 2022-08-10 LAB — COMPREHENSIVE METABOLIC PANEL
ALT: 16 U/L (ref 0–44)
AST: 19 U/L (ref 15–41)
Albumin: 2.9 g/dL — ABNORMAL LOW (ref 3.5–5.0)
Alkaline Phosphatase: 84 U/L (ref 38–126)
Anion gap: 11 (ref 5–15)
BUN: 11 mg/dL (ref 8–23)
CO2: 27 mmol/L (ref 22–32)
Calcium: 8.7 mg/dL — ABNORMAL LOW (ref 8.9–10.3)
Chloride: 100 mmol/L (ref 98–111)
Creatinine, Ser: 1.15 mg/dL (ref 0.61–1.24)
GFR, Estimated: >60 mL/min (ref >60)
Glucose, Bld: 80 mg/dL (ref 70–99)
Potassium: 3.9 mmol/L (ref 3.5–5.1)
Sodium: 138 mmol/L (ref 135–145)
Total Bilirubin: 0.7 mg/dL (ref 0.3–1.2)
Total Protein: 5.6 g/dL — ABNORMAL LOW (ref 6.5–8.1)

## 2022-08-10 LAB — ECHOCARDIOGRAM COMPLETE
Height: 72 in
S' Lateral: 3.1 cm
Weight: 3121.71 oz

## 2022-08-10 LAB — HEMOGLOBIN AND HEMATOCRIT, BLOOD
HCT: 33 % — ABNORMAL LOW (ref 39.0–52.0)
Hemoglobin: 10.9 g/dL — ABNORMAL LOW (ref 13.0–17.0)

## 2022-08-10 LAB — TSH: TSH: 2.962 u[IU]/mL (ref 0.350–4.500)

## 2022-08-10 MED ORDER — PANTOPRAZOLE SODIUM 40 MG IV SOLR
40.0000 mg | Freq: Two times a day (BID) | INTRAVENOUS | Status: DC
  Administered 2022-08-10 – 2022-08-11 (×4): 40 mg via INTRAVENOUS
  Filled 2022-08-10 (×5): qty 10

## 2022-08-10 MED ORDER — PERFLUTREN LIPID MICROSPHERE
1.0000 mL | INTRAVENOUS | Status: AC | PRN
  Administered 2022-08-10: 2 mL via INTRAVENOUS

## 2022-08-10 MED ORDER — LIDOCAINE 5 % EX PTCH
1.0000 | MEDICATED_PATCH | CUTANEOUS | Status: DC
  Administered 2022-08-10: 1 via TRANSDERMAL
  Filled 2022-08-10: qty 1

## 2022-08-10 MED ORDER — MORPHINE SULFATE (PF) 2 MG/ML IV SOLN: 1.0000 mg | INTRAVENOUS | Status: DC | PRN

## 2022-08-10 MED ORDER — POTASSIUM CHLORIDE CRYS ER 20 MEQ PO TBCR
40.0000 meq | EXTENDED_RELEASE_TABLET | Freq: Once | ORAL | Status: AC
  Administered 2022-08-10: 40 meq via ORAL
  Filled 2022-08-10: qty 2

## 2022-08-10 MED ORDER — FENTANYL 50 MCG/HR TD PT72
1.0000 | MEDICATED_PATCH | TRANSDERMAL | Status: DC
  Administered 2022-08-10 – 2022-08-13 (×2): 1 via TRANSDERMAL
  Filled 2022-08-10 (×2): qty 1

## 2022-08-10 MED ORDER — FUROSEMIDE 10 MG/ML IJ SOLN
40.0000 mg | Freq: Once | INTRAMUSCULAR | Status: AC
  Administered 2022-08-10: 40 mg via INTRAVENOUS
  Filled 2022-08-10: qty 4

## 2022-08-10 MED ORDER — BISACODYL 5 MG PO TBEC: 10.0000 mg | DELAYED_RELEASE_TABLET | Freq: Every day | ORAL | Status: DC | PRN

## 2022-08-10 MED ORDER — DOCUSATE SODIUM 100 MG PO CAPS
200.0000 mg | ORAL_CAPSULE | Freq: Two times a day (BID) | ORAL | Status: DC
  Administered 2022-08-10 – 2022-08-16 (×12): 200 mg via ORAL
  Filled 2022-08-10 (×14): qty 2

## 2022-08-10 MED ORDER — ALBUTEROL SULFATE (2.5 MG/3ML) 0.083% IN NEBU: 2.5000 mg | INHALATION_SOLUTION | Freq: Four times a day (QID) | RESPIRATORY_TRACT | Status: DC | PRN

## 2022-08-10 NOTE — Evaluation (Signed)
Physical Therapy Evaluation Patient Details Name: Francisco [name] MRN: [id number] DOB: [birth year] Today's Date: 08/10/2022  History of Present Illness  77 y/o M admitted to [facility] on 2/2 from PCP office for dark stools and GI bleed. Pt had a fall ~2 weeks ago, reporting LBP. Imaging reveals L2 compression fracture, managing through medication. PMHx: COPD, venous stasis, PAD, anemia, HLD, history of GI bleed with AVM, BPH, lung cancer on observation, essential tremor, HTN, CHF, PE on Eliquis.  Clinical Impression  Pt presents today with impaired functional mobility, limited by strength, balance, and endurance. Pt required minA and HHA for sit<>stand and stand pivot transfer today, fatiguing after mobility and declining any attempts at ambulation. At baseline pt is able to ambulate in his home with intermittent use of a RW, but notes 3 falls over the past few months. Pt has full time care from his wife who assists with transportation as well as stair negotiation. Pt will continue to benefit from skilled acute PT at this time to progress mobility and activity tolerance and return pt to his PLOF. Recommend HHPT at discharge for continued progression of mobility, acute PT will continue to follow as appropriate.        Recommendations for follow up therapy are one component of a multi-disciplinary discharge planning process, led by the attending physician.  Recommendations may be updated based on patient status, additional functional criteria and insurance authorization.  Follow Up Recommendations Home health PT      Assistance Recommended at Discharge Frequent or constant Supervision/Assistance  Patient can return home with the following  A little help with walking and/or transfers;A little help with bathing/dressing/bathroom;Assistance with cooking/housework;Assist for transportation;Help with stairs or ramp for entrance    Equipment Recommendations Other (comment) (transport chair and large BSC)   Recommendations for Other Services  OT consult    Functional Status Assessment Patient has had a recent decline in their functional status and demonstrates the ability to make significant improvements in function in a reasonable and predictable amount of time.     Precautions / Restrictions Precautions Precautions: Fall Precaution Comments: L2 compression fracture Restrictions Weight Bearing Restrictions: No      Mobility  Bed Mobility Overal bed mobility: Needs Assistance Bed Mobility: Sit to Supine       Sit to supine: Min assist   General bed mobility comments: pt on BSC upon arrival, minA provided for BLE management to return to supine    Transfers Overall transfer level: Needs assistance Equipment used: 1 person hand held assist Transfers: Sit to/from Stand, Bed to chair/wheelchair/BSC Sit to Stand: Min assist Stand pivot transfers: Min assist         General transfer comment: minA provided for powerup and balance for sit>stand, minA for safety and balance with 1HHA for stand pivot back to bed    Ambulation/Gait               General Gait Details: pt declining due to fatigue  Stairs            Wheelchair Mobility    Modified Rankin (Stroke Patients Only)       Balance Overall balance assessment: Needs assistance Sitting-balance support: Feet supported, Single extremity supported Sitting balance-Leahy Scale: Fair Sitting balance - Comments: stable balance with static sitting on EOB   Standing balance support: Single extremity supported, During functional activity Standing balance-Leahy Scale: Fair Standing balance comment: pt with 1HHA and use of bedrail for stand pivot transfer to bed, with static  standing for assist for hygiene, pt relying on external support for balance from [facility]                             Pertinent Vitals/Pain Pain Assessment Pain Assessment: Faces Faces Pain Scale: Hurts little more Pain Location:  back and generalized Pain Descriptors / Indicators: Aching Pain Intervention(s): Limited activity within patient's tolerance, Monitored during session, Repositioned    Home Living Family/patient expects to be discharged to:: Private residence Living Arrangements: Spouse/significant other Available Help at Discharge: Family;Available 24 hours/day Type of Home: House Home Access: Stairs to enter Entrance Stairs-Rails: Left Entrance Stairs-Number of Steps: 1 Alternate Level Stairs-Number of Steps: stays on first level Home Layout: Two level;Able to live on main level with bedroom/bathroom Home Equipment: Rolling Walker (2 wheels);Rollator (4 wheels);BSC/3in1;Hospital bed;Other (comment);Grab bars - toilet;Grab bars - tub/shower;Shower seat - built in Brewing technologist) Additional Comments: has been borrowing a transport chair from friends but will have to return, requesting one at D/C    Prior Function Prior Level of Function : Needs assist       Physical Assist : Mobility (physical) Mobility (physical): Stairs   Mobility Comments: pt requires assist with step negotiation to enter his home. Pt ambulates with intermittent use of RW for home distances, currently utilizing a transport chair for community distances, wife provides transport. pt reports 3 falls over the past few months       Hand Dominance   Dominant Hand: Right    Extremity/Trunk Assessment   Upper Extremity Assessment Upper Extremity Assessment: Generalized weakness    Lower Extremity Assessment Lower Extremity Assessment: Generalized weakness    Cervical / Trunk Assessment Cervical / Trunk Assessment: Kyphotic (L2 compression fracture with history of other compression fractures per wife)  Communication   Communication: No difficulties  Cognition Arousal/Alertness: Awake/alert Behavior During Therapy: WFL for tasks assessed/performed Overall Cognitive Status: Within Functional Limits for tasks assessed                                  General Comments: pleasant throughout session, oriented x4        General Comments General comments (skin integrity, edema, etc.): VSS on 4L O2 [place], HR elevated 110s-120s. Pt reporting fatigue and feeling SOB but SPO2 maintaining 98% or greater    Exercises     Assessment/Plan    PT Assessment Patient needs continued PT services  PT Problem List Decreased strength;Decreased activity tolerance;Decreased balance;Decreased mobility;Decreased safety awareness;Decreased knowledge of use of DME       PT Treatment Interventions DME instruction;Gait training;Stair training;Functional mobility training;Therapeutic activities;Therapeutic exercise;Balance training;Neuromuscular re-education;Patient/family education    PT Goals (Current goals can be found in the Care Plan section)  Acute Rehab PT Goals Patient Stated Goal: go home PT Goal Formulation: With patient Time For Goal Achievement: 08/24/22 Potential to Achieve Goals: Good    Frequency Min 3X/week     Co-evaluation               AM-PAC PT "6 Clicks" Mobility  Outcome Measure Help needed turning from your back to your side while in a flat bed without using bedrails?: A Little Help needed moving from lying on your back to sitting on the side of a flat bed without using bedrails?: A Little Help needed moving to and from a bed to a chair (including a wheelchair)?: A Little Help  needed standing up from a chair using your arms (e.g., wheelchair or bedside chair)?: A Little Help needed to walk in hospital room?: A Little Help needed climbing 3-5 steps with a railing? : A Lot 6 Click Score: 17    End of Session Equipment Utilized During Treatment: Oxygen (4L O2 ) Activity Tolerance: Patient limited by fatigue Patient left: in bed;with call bell/phone within reach;with family/visitor present Nurse Communication: Mobility status PT Visit Diagnosis: History of falling (Z91.81);Muscle  weakness (generalized) (M62.81);Other abnormalities of gait and mobility (R26.89)    Time: 2706-2376 PT Time Calculation (min) (ACUTE ONLY): 28 min   Charges:   PT Evaluation $PT Eval Low Complexity: 1 Low          Charlynne Cousins, PT DPT Acute Rehabilitation Services Office [phone]   Francisco [name] 08/10/2022, 4:25 PM

## 2022-08-10 NOTE — Progress Notes (Signed)
   08/09/22 2030  Assess: MEWS Score  Temp 98 F (36.7 C)  BP (!) 140/112  MAP (mmHg) 122  Pulse Rate (!) 119  ECG Heart Rate (!) 114  Resp 16  SpO2 98 %  O2 Device Nasal Cannula  O2 Flow Rate (L/min) 4 L/min  Assess: MEWS Score  MEWS Temp 0  MEWS Systolic 0  MEWS Pulse 2  MEWS RR 0  MEWS LOC 0  MEWS Score 2  MEWS Score Color Yellow  Assess: if the MEWS score is Yellow or Red  Were vital signs taken at a resting state? Yes  Focused Assessment No change from prior assessment  Does the patient meet 2 or more of the SIRS criteria? No  Does the patient have a confirmed or suspected source of infection? No  MEWS guidelines implemented *See Row Information* Yes  Treat  MEWS Interventions Administered scheduled meds/treatments;Administered prn meds/treatments  Pain Scale 0-10  Pain Score 10  Pain Type Chronic pain  Pain Location Back  Complains of Anxiety  Take Vital Signs  Increase Vital Sign Frequency  Yellow: Q 2hr X 2 then Q 4hr X 2, if remains yellow, continue Q 4hrs  Escalate  MEWS: Escalate Yellow: discuss with charge nurse/RN and consider discussing with provider and RRT  Notify: Charge Nurse/RN  Name of Charge Nurse/RN Notified Janie  Date Charge Nurse/RN Notified 08/09/22  Time Charge Nurse/RN Notified 2030  Document  Patient Outcome Stabilized after interventions  Progress note created (see row info) Yes  Assess: SIRS CRITERIA  SIRS Temperature  0  SIRS Pulse 1  SIRS Respirations  0  SIRS WBC 0  SIRS Score Sum  1

## 2022-08-10 NOTE — Consult Note (Signed)
Consultation  Referring Provider: TRh/ Candiss Norse Primary Care Physician:  [name], MD Primary Gastroenterologist:  Dr.Mansouraty  Reason for Consultation: Melena  HPI: Francisco [name] is a 77 y.o. male, with history of severe COPD, oxygen dependent on 4 L/day, history of peripheral arterial disease, prior history of lung cancer currently on observation, hyperlipidemia, congestive heart failure, hypertension and prior history of PE for which she has been on Eliquis.  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 Patient was seen by his PCP yesterday and reported that he had noted dark stools over the prior 2 days.  His wife says he currently takes his iron every other day and stools are generally dark but have been notably more dark over the past couple of days.  Also patient had a fall which actually sounds like a syncopal episode at home about 2 weeks ago and has had severe lower back pain since that time.  Decision was made for patient to be seen in the emergency room and subsequently admitted.  He has been hemodynamically stable Stool documented heme positive CT of the abdomen and pelvis done yesterday shows a 1.7 x 1.7 cm cystic lesion in the tail of the pancreas, and an acute compression fracture of L2 with mild retropulsion, he has a right inguinal hernia, and a left inguinal hernia containing colon.. Labs today with hemoglobin of 11 down from his baseline of 12> 9.9 last evening and up to 11 this morning WBC 16.0/plts318  Potassium 3.9 BUN 11 creatinine 1.15 albumin 2.9 LFTs within normal limits.  Patient states he has been constipated, worse with acute back pain and had not had a bowel movement since 08/06/2022.  He has been given laxatives since admission and is currently on the bedside commode. His last dose of Eliquis was yesterday morning.  He has no complaints of heartburn or indigestion, no dysphagia or odynophagia, no abdominal pain.  His primary  complaint is severe lower back pain  Had undergone EGD in August 2021 per Dr. Rush Landmark with finding of a small amount of blood in the duodenal bulb secondary to oozing AVM which was treated with APC, also noted a 3 cm hiatal hernia Last colonoscopy was done and 2010 per Dr. Earlean Shawl he had 3 polyps removed and internal hemorrhoids, has history of adenomatous colon polyps.      Past Medical History:  Diagnosis Date   Chronic back pain    "mid and lower" (04/07/2018)   Chronic rhinitis    -Sinus Ct 08/01/2009 >> Bilateral maxillary sinusitis with some mucosal thickeningin the sphenoid and frontal sinuses as well with air fluid levels present -chronic rhinitis flyer Aug 04, 2009   Compressed spine fracture [facility])    COPD (chronic obstructive pulmonary disease) (HCC)    PFT's rec Jul 17, 2009   Dyspnea    Emphysema lung ([place])    On home oxygen therapy    "3L; 24/7" (04/07/2018)   Onychomycosis    Dr. Judi Cong   Orthostatic hypotension    "since 10/2017" (04/07/2018)   PAD (peripheral artery disease) ([place])    Pneumonia    "twice in 1 year" (04/07/2018)   Pulmonary embolism ([place]) 04/07/2018   Skin cancer    "lips, face, ears, arms" (04/07/2018)   Small cell lung cancer, right ([place]) 2023   Dr. Lisbeth Renshaw.  rad   Vertigo    "since ~ 02/2018" (04/07/2018)    Past Surgical History:  Procedure Laterality Date  ABDOMINAL AORTOGRAM W/LOWER EXTREMITY N/A 01/21/2020   Procedure: ABDOMINAL AORTOGRAM W/LOWER EXTREMITY;  Surgeon: Elam Dutch, MD;  Location: [place] [facility];  Service: Cardiovascular;  Laterality: N/A;   APPENDECTOMY     BIOPSY  02/20/2020   Procedure: BIOPSY;  Surgeon: Rush Landmark Telford Nab., MD;  Location: [place];  Service: Gastroenterology;;   BRONCHIAL BIOPSY  09/04/2021   Procedure: BRONCHIAL BIOPSIES;  Surgeon: Garner Nash, DO;  Location: [place] [facility];  Service: Pulmonary;;   BRONCHIAL BRUSHINGS  09/04/2021   Procedure: BRONCHIAL BRUSHINGS;  Surgeon: Garner Nash, DO;  Location: [place];  Service: Pulmonary;;   BRONCHIAL NEEDLE ASPIRATION BIOPSY  09/04/2021   Procedure: BRONCHIAL NEEDLE ASPIRATION BIOPSIES;  Surgeon: Garner Nash, DO;  Location: [place];  Service: Pulmonary;;   CATARACT EXTRACTION, BILATERAL     ENDARTERECTOMY FEMORAL Right 04/19/2019   Procedure: ENDARTERECTOMY RIGHT COMMON FEMORAL;  Surgeon: Elam Dutch, MD;  [facility]: [facility];  Service: Vascular;  Laterality: Right;   ENDARTERECTOMY FEMORAL Left 01/24/2020   Procedure: LEFT FEMORAL ENDARTERECTOMY WITH DACRON PATCH ANGIOPLASTY;  Surgeon: Elam Dutch, MD;  Location: [facility];  Service: Vascular;  Laterality: Left;   ESOPHAGOGASTRODUODENOSCOPY (EGD) WITH PROPOFOL N/A 02/20/2020   Procedure: ESOPHAGOGASTRODUODENOSCOPY (EGD) WITH PROPOFOL;  Surgeon: Irving Copas., MD;  Location: [place];  Service: Gastroenterology;  Laterality: N/A;   FEMORAL ENDARTERECTOMY Left 01/24/2020   FIDUCIAL MARKER PLACEMENT  09/04/2021   Procedure: FIDUCIAL MARKER PLACEMENT;  Surgeon: Garner Nash, DO;  Location: [place];  Service: Pulmonary;;   HEMOSTASIS CLIP PLACEMENT  02/20/2020   Procedure: HEMOSTASIS CLIP PLACEMENT;  Surgeon: Irving Copas., MD;  Location: [place];  Service: Gastroenterology;;   HOT HEMOSTASIS N/A 02/20/2020   Procedure: HOT HEMOSTASIS (ARGON PLASMA COAGULATION/BICAP);  Surgeon: Irving Copas., MD;  Location: [place];  Service: Gastroenterology;  Laterality: N/A;   INSERTION OF ILIAC STENT Left 01/24/2020   Procedure: INSERTION OF VBX STENT 8X59 AND 8X39 LEFT COMMON ILIAC ARTERY. INSERTION OF INNOVA 7 X 60 INNOVA STENT LEFT EXTERNAL ILIAC ARTERY. ;  Surgeon: Elam Dutch, MD;  Location: [place];  Service: Vascular;  Laterality: Left;   LOWER EXTREMITY ANGIOGRAPHY  12/11/2018   LOWER EXTREMITY ANGIOGRAPHY N/A 12/11/2018   Procedure: LOWER EXTREMITY ANGIOGRAPHY;  Surgeon: Elam Dutch, MD;  Location: [place] [facility];   Service: Cardiovascular;  Laterality: N/A;   PATCH ANGIOPLASTY Right 04/19/2019   Procedure: Patch Angioplasty;  Surgeon: Elam Dutch, MD;  Location: [facility] OR;  Service: Vascular;  Laterality: Right;   PERIPHERAL VASCULAR INTERVENTION Right 12/11/2018   Procedure: PERIPHERAL VASCULAR INTERVENTION;  Surgeon: Elam Dutch, MD;  Location: [place] [facility];  Service: Cardiovascular;  Laterality: Right;  Common Iliac    SKIN CANCER EXCISION     "lips, face, ears, arms" (04/07/2018)   TONSILLECTOMY     ULTRASOUND GUIDANCE FOR VASCULAR ACCESS Right 01/24/2020   Procedure: ULTRASOUND GUIDANCE FOR VASCULAR ACCESS;  Surgeon: Elam Dutch, MD;  Location: [place];  Service: Vascular;  Laterality: Right;   VIDEO BRONCHOSCOPY WITH RADIAL ENDOBRONCHIAL ULTRASOUND  09/04/2021   Procedure: VIDEO BRONCHOSCOPY WITH RADIAL ENDOBRONCHIAL ULTRASOUND;  Surgeon: Garner Nash, DO;  Location: [place];  Service: Pulmonary;;    Prior to Admission medications   Medication Sig Start Date End Date Taking? Authorizing Provider  acetaminophen (TYLENOL) 500 MG tablet Take 500 mg by mouth every 4 (four) hours as needed for moderate pain, fever, headache or mild  pain.   Yes [provider]  albuterol (VENTOLIN HFA) 108 (90 Base) MCG/ACT inhaler USE 2 INHALATIONS BY MOUTH  EVERY 6 HOURS AS NEEDED FOR WHEEZING Patient taking differently: Inhale 2 puffs into the lungs every 6 (six) hours as needed for wheezing. 12/31/21  Yes Brand Males, MD  azithromycin (ZITHROMAX) 250 MG tablet Take 1 tablet (250 mg total) by mouth 3 (three) times a week. Patient taking differently: Take 250 mg by mouth every Monday, Wednesday, and Friday. 07/31/22  Yes Brand Males, MD  Calcium Carbonate-Vitamin D (CALCIUM 600+D PO) Take 2 tablets by mouth daily.   Yes [provider]  ELIQUIS 2.5 MG TABS tablet TAKE 1 TABLET BY MOUTH TWICE  DAILY Patient taking differently: Take 2.5 mg by mouth 2 (two) times daily.  05/15/22  Yes [name], MD  Ferrous Sulfate (IRON) 325 (65 Fe) MG TABS Take 325 mg by mouth every other day.   Yes [provider]  fluticasone (FLONASE) 50 MCG/ACT nasal spray USE 2 SPRAYS IN BOTH  NOSTRILS DAILY Patient taking differently: Place 2 sprays into both nostrils daily. 03/18/22  Yes [name], MD  furosemide (LASIX) 20 MG tablet TAKE 1 TABLET BY MOUTH  DAILY AS NEEDED FOR FLUID  OR EDEMA Patient taking differently: Take 20 mg by mouth daily. 12/17/21  Yes [name], MD  Multiple Vitamin (MULTIVITAMIN WITH MINERALS) TABS tablet Take 1 tablet by mouth daily. 02/26/20  Yes Mikhail, Maryann, DO  OXYGEN Inhale 4-5 L into the lungs continuous.    Yes [provider]  pantoprazole (PROTONIX) 20 MG tablet TAKE 1 TABLET BY MOUTH  DAILY 02/04/22  Yes [name], MD  Polyethyl Glycol-Propyl Glycol (SYSTANE) 0.4-0.3 % SOLN Place 1 drop into both eyes every other day.   Yes [provider]  predniSONE (DELTASONE) 10 MG tablet Take 1 tablet (10 mg total) by mouth daily with breakfast. 03/19/22  Yes Brand Males, MD  roflumilast (DALIRESP) 500 MCG TABS tablet TAKE 1 TABLET BY MOUTH  DAILY Patient taking differently: Take 500 mcg by mouth daily. 05/03/22  Yes Brand Males, MD  rosuvastatin (CRESTOR) 10 MG tablet TAKE 1 TABLET BY MOUTH DAILY Patient taking differently: Take 10 mg by mouth daily. 05/15/22  Yes [name], MD  SPIRIVA RESPIMAT 2.5 MCG/ACT AERS USE 2 INHALATIONS BY MOUTH  DAILY Patient taking differently: Inhale 2 each into the lungs daily. 12/04/21  Yes Brand Males, MD  SYMBICORT 160-4.5 MCG/ACT inhaler USE 2 INHALATIONS BY MOUTH  TWICE DAILY Patient taking differently: Inhale 2 puffs into the lungs 2 (two) times daily. 08/14/21  Yes [name], MD  doxycycline (VIBRA-TABS) 100 MG tablet Take 1 tablet (100 mg total) by mouth 2 (two) times daily. Patient not taking: Reported on 08/09/2022 07/30/22   [name], MD  folic acid (FOLVITE) 1 MG tablet Take 1 tablet (1 mg total) by mouth daily. Patient not taking: Reported on 08/09/2022 02/26/20   Cristal Ford, DO  oxycodone (OXY-IR) 5 MG capsule Take 1 capsule (5 mg total) by mouth every 6 (six) hours as needed (arm pain after fall- particularly with dressing changes due to sticking issue). Patient not taking: Reported on 08/09/2022 05/02/22   [name], MD    Current Facility-Administered Medications  Medication Dose Route Frequency Provider Last Rate Last Admin   acetaminophen (TYLENOL) tablet 1,000 mg  1,000 mg Oral Q6H Kristopher Oppenheim, DO   1,000 mg at 08/10/22 [phone]  albuterol (PROVENTIL) (2.5 MG/3ML) 0.083% nebulizer solution 2.5 mg  2.5 mg Nebulization Q6H PRN Thurnell Lose, MD       bisacodyl (DULCOLAX) EC tablet 10 mg  10 mg Oral Daily PRN Thurnell Lose, MD       docusate sodium (COLACE) capsule 200 mg  200 mg Oral BID Thurnell Lose, MD       fentaNYL (DURAGESIC) 50 MCG/HR 1 patch  1 patch Transdermal Q72H Thurnell Lose, MD       ferrous sulfate tablet 325 mg  325 mg Oral Threasa Heads, MD   325 mg at 08/10/22 0930   mometasone-formoterol (DULERA) 200-5 MCG/ACT inhaler 2 puff  2 puff Inhalation BID Marcelyn Bruins, MD       morphine (PF) 2 MG/ML injection 1 mg  1 mg Intravenous Q4H PRN Thurnell Lose, MD       oxyCODONE (Oxy IR/ROXICODONE) immediate release tablet 5 mg  5 mg Oral Q6H PRN Marcelyn Bruins, MD   5 mg at 08/10/22 0930   pantoprazole (PROTONIX) injection 40 mg  40 mg Intravenous Q12H Thurnell Lose, MD   40 mg at 08/10/22 7858   polyethylene glycol (MIRALAX / GLYCOLAX) packet 17 g  17 g Oral Daily Marcelyn Bruins, MD   17 g at 08/10/22 0932   predniSONE (DELTASONE) tablet 10 mg  10 mg Oral Q breakfast Marcelyn Bruins, MD   10 mg at 08/10/22 8502   roflumilast (DALIRESP) tablet 500 mcg  500 mcg Oral Daily Marcelyn Bruins, MD   500 mcg at 08/10/22 7741   rosuvastatin  (CRESTOR) tablet 10 mg  10 mg Oral Daily Marcelyn Bruins, MD   10 mg at 08/10/22 0930   sodium chloride flush (NS) 0.9 % injection 3 mL  3 mL Intravenous Q12H Marcelyn Bruins, MD   3 mL at 08/10/22 2878   umeclidinium bromide (INCRUSE ELLIPTA) 62.5 MCG/ACT 1 puff  1 puff Inhalation Daily Marcelyn Bruins, MD        Allergies as of 08/09/2022 - Review Complete 08/09/2022  Allergen Reaction Noted   Tape Other (See Comments) 11/02/2017   Augmentin [amoxicillin-pot clavulanate] Other (See Comments) 08/29/2021   Cipro [ciprofloxacin hcl] Nausea And Vomiting    Gadavist [gadobutrol] Other (See Comments) 07/29/2022   Levaquin [levofloxacin] Other (See Comments) 07/14/2018   Lipitor [atorvastatin] Rash 03/10/2020    Family History  Problem Relation Age of Onset   Heart disease Mother        CABG in her 35s, nonsmoker   Cancer Mother    Stroke Father    Heart disease Father        Died of MI at age 68, smoker   Hepatitis Sister    Coronary artery disease Other        male 1st degree relative <60   Colon cancer Neg Hx    Pancreatic cancer Neg Hx    Esophageal cancer Neg Hx    Inflammatory bowel disease Neg Hx    Liver disease Neg Hx    Rectal cancer Neg Hx    Stomach cancer Neg Hx     Social History   Socioeconomic History   Marital status: Married    Spouse name: Not on file   Number of children: 2   Years of education: 3 years of college   Highest education level: Not on file  Occupational History    Employer: [employer]  Tobacco  Use   Smoking status: Former    Packs/day: 2.00    Years: 52.00    Total pack years: 104.00    Types: Pipe, Cigarettes    Start date: 10/06/1965    Quit date: 10/06/2017    Years since quitting: 4.8   Smokeless tobacco: Never   Tobacco comments:    04/07/2018 "smoked cigarettes years ago; stopped ~ 20 yr ago; stopped smoking pipe in 09/2017"  Vaping Use   Vaping Use: Never used  Substance and Sexual Activity   Alcohol use: Yes     Alcohol/week: 35.0 standard drinks of alcohol    Types: 35 Cans of beer per week   Drug use: No   Sexual activity: Not Currently  Other Topics Concern   Not on file  Social History Narrative   Married (wife Katy Apo patient of Dr. Yong Channel) with children (2-son and daughter). 1 grandson.       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       Hobbies: fishing, projects at home   Social Determinants of Health   Financial Resource Strain: Not on file  Food Insecurity: Not on file  Transportation Needs: Not on file  Physical Activity: Not on file  Stress: Not on file  Social Connections: Not on file  Intimate Partner Violence: Not on file    Review of Systems: Pertinent positive and negative review of systems were noted in the above HPI section.  All other review of systems was otherwise negative.   Physical Exam: Vital signs in last 24 hours: Temp:  [97.4 F (36.3 C)-98.2 F (36.8 C)] 97.4 F (36.3 C) (02/03 0801) Pulse Rate:  [104-119] 107 (02/03 0801) Resp:  [14-20] 15 (02/03 0801) BP: (109-160)/(73-112) 146/74 (02/03 0801) SpO2:  [65 %-100 %] 98 % (02/02 2030) Weight:  [83.5 kg-88.5 kg] 88.5 kg (02/03 0500)   General:   Alert,  Well-developed, chronically ill-appearing elderly white male on bedside commode, pleasant and cooperative in NAD wife at bedside Head:  Normocephalic and atraumatic. Eyes:  Sclera clear, no icterus.   Conjunctiva pink. Ears:  Normal auditory acuity. Nose:  No deformity, discharge,  or lesions. Mouth:  No deformity or lesions.   Neck:  Supple; no masses or thyromegaly. Lungs: Diffuse scattered rhonchi , on O2 4 L  heart: tachy Regular rate and rhythm; no murmurs, clicks, rubs,  or gallops. Abdomen:  Soft,nontender, BS active,nonpalp mass or hsm.   Rectal:  not done Msk:  Symmetrical without gross deformities. . Pulses:  Normal pulses noted. Extremities:  Without clubbing or edema. Neurologic:  Alert and  oriented  x4;  grossly normal neurologically. Skin:  Intact without significant lesions or rashes.. Psych:  Alert and cooperative. Normal mood and affect.  Intake/Output from previous day: No intake/output data recorded. Intake/Output this shift: No intake/output data recorded.  Lab Results: Recent Labs    08/09/22 1330 08/10/22 0322 08/10/22 1123  WBC 10.7* 9.3 16.0*  HGB 11.0* 9.9* 11.0*  HCT 34.7* 30.9* 34.7*  PLT 307 249 318   BMET Recent Labs    08/09/22 1330 08/10/22 0322  NA 139 138  K 4.7 3.9  CL 99 100  CO2 28 27  GLUCOSE 108* 80  BUN 12 11  CREATININE 1.12 1.15  CALCIUM 9.2 8.7*   LFT Recent Labs    08/10/22 0322  PROT 5.6*  ALBUMIN 2.9*  AST 19  ALT 16  ALKPHOS 84  BILITOT 0.7   PT/INR Recent  Labs    08/09/22 1330  LABPROT 14.8  INR 1.2     IMPRESSION:  #62 77 year old white male on chronic Eliquis with history of melena, noted on a couple of occasions over the past week (last bowel movement 08/06/2022) He has had a drifting hemoglobin from his baseline of 12 down to 9.9 yesterday but improved today up to 11  Patient has history of 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  Etiology of this most recent melena is not clear.  He certainly may have other AVMs, consider chronic gastropathy, ulcer disease, or lower GI source.  He is currently stable, not actively hemorrhaging and his hemoglobin has actually improved  #2 severe COPD on chronic oxygen at 4 L #3 acute L2 compression fracture with severe lower back pain #4 prior history of PE for which she is on Eliquis #5 history of congestive heart failure #6.  History of lung cancer currently on observation #7.  Peripheral arterial disease #8 history of adenomatous colon polyps last colonoscopy greater than 10 years ago #9 chronic EtOH use  Plan; will advance to regular diet today Continue IV PPI twice daily Continue to hold Eliquis for washout, last dose  yesterday morning Will need to review his history regarding the prior PE, and has question the need for chronic anticoagulation Serial hemoglobins and transfuse as indicated We do not plan on EGD today, he is at higher risk for complications with anesthesia given his severe COPD and chronic oxygen use.  If he has any further drop in hemoglobin and/or decision made that he has to stay on Eliquis then we will plan for EGD after discharge.  If remains stable and does not need chronic Eliquis, may not require EGD  GI will follow with you   Aracelys Glade PA-C 08/10/2022, 11:53 AM

## 2022-08-10 NOTE — Progress Notes (Signed)
PROGRESS NOTE                                                                                                                                                                                                             Patient Demographics:    Francisco [name], is a 77 y.o. male, DOB - Apr 22, 1946, GNF:[id number]  Outpatient Primary MD for the patient is [name], MD    LOS - 0  Admit date - 08/09/2022    Chief Complaint  Patient presents with   GI Bleeding       Brief Narrative (HPI from H&P)   77 y.o. male with medical history significant of COPD, venous stasis, PAD, anemia, hyperlipidemia, history of GI bleed with AVM, BPH, lung cancer on observation, essential tremor, hypertension, CHF, PE on Eliquis presenting with dark stools from PCP office.   Patient has had dark stools for the past 2 days.  Is on iron therapy but PCP felt he looked pale and sent him to the ED for further evaluation for possible GI bleed.  This is in the setting of history of known prior GI bleed and currently on Eliquis.  Patient additionally had a fall 2 weeks ago and has had some low back pain since that time.  Additionally is reporting some constipation.  In the ER he was found to have another L-spine compression fracture acute on chronic, of note he was due to be seen at [facility] in the next coming days, he was also found to be anemic and admitted to the hospital for pain control and anemia treatment.   Subjective:    Francisco [name] today has, No headache, No chest pain, No abdominal pain - No Nausea, No new weakness tingling or numbness, no shortness of breath but acute on chronic low back pain.   Assessment  & Plan :   Upper GI bleed with bleeding, with acute blood loss related anemia.  Has heme positive and melanotic stools, currently on IV PPI H&H closely being monitored, type screen done agreeable for transfusion if needed.  Was on  clears overnight, currently n.p.o., GI has been informed likely will require EGD.  Has previously undergone EGD by Dr. Rush Landmark.   Acute on chronic low back pain.  Has history of L-spine fractures in the past, had fall 2 weeks ago after  which looks like he has a subacute L2 compression fracture as well.  Currently pain control, PT OT, [facility] consulted who are to see the patient shortly.  Dr. Thereasa Solo has been texted and informed.  Severe underlying COPD with cor pulmonale and pulmonary hypertension.  He is on 4 to 5 L of oxygen at all times, currently also has evidence of mild fluid overload in the lungs, continue home nebulizer treatments, oxygen, encouraged to sit up in chair use I-S and flutter valve for pulmonary toiletry.  Will diurese on top of his lung medications and monitor.  Acute on chronic diastolic CHF last EF 85%.  Last echocardiogram 5 years ago, diurese, repeat echo.  PAD.  No acute issues.  Continue statin for secondary prevention.  History of PE.  On Eliquis currently on hold due to GI bleed, this has been at least 2 years or more.  Dyslipidemia.  On statin.  History of lung cancer.  Outpatient follow-up with his pulmonologist [name] and his oncologist.        Condition - Extremely Guarded  Family Communication  :  wife on 08/10/22  Code Status :  DNR  Consults  :  GI, Ortho  PUD Prophylaxis : PPI   Procedures  :     Echocardiogram.    CT scan abdomen pelvis.  1. Acute appearing compression fracture of L2 with mild retropulsion of fracture fragments. Please correlate clinically. 2. Left inguinal hernia containing colon has increased in size. No bowel obstruction. 3. Stable right inguinal hernia containing bladder. 4. 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 Aortic Atherosclerosis (ICD10-I70.0).  CT L-spine. - 1. Acute to subacute fracture deformity of the L2 vertebral body is new when compared with the MRI from 07/27/2022. Loss of  approximately 50% of the central vertebral body height is identified. Mild retropulsion is identified along the posterior aspect of the superior endplate. 2. Chronic fracture deformity involving the L4 vertebral body is again noted with loss of approximately 70% of the central vertebral body height. 3. Age-indeterminate L3 inferior endplate compression deformity is unchanged when compared with the MRI from 07/27/22      Disposition Plan  :    Status is: Observation  DVT Prophylaxis  :    Place and maintain sequential compression device Start: 08/10/22 0554 SCDs Start: 08/09/22 1844    Lab Results  Component Value Date   PLT 249 08/10/2022    Diet :  Diet Order             Diet NPO time specified Except for: Sips with Meds  Diet effective now                    Inpatient Medications  Scheduled Meds:  acetaminophen  1,000 mg Oral Q6H   ferrous sulfate  325 mg Oral QODAY   lidocaine  1 patch Transdermal Q24H   mometasone-formoterol  2 puff Inhalation BID   pantoprazole (PROTONIX) IV  40 mg Intravenous Q12H   polyethylene glycol  17 g Oral Daily   predniSONE  10 mg Oral Q breakfast   roflumilast  500 mcg Oral Daily   rosuvastatin  10 mg Oral Daily   sodium chloride flush  3 mL Intravenous Q12H   umeclidinium bromide  1 puff Inhalation Daily   Continuous Infusions: PRN Meds:.albuterol, oxyCODONE  Antibiotics  :    Anti-infectives (From admission, onward)    None         Objective:  Vitals:   08/10/22 0400 08/10/22 0500 08/10/22 0800 08/10/22 0801  BP: (!) 145/75  (!) 146/74 (!) 146/74  Pulse: (!) 111  (!) 107 (!) 107  Resp: 15  14 15   Temp: 97.7 F (36.5 C)  (!) 97.4 F (36.3 C) (!) 97.4 F (36.3 C)  TempSrc: Oral  Oral Oral  SpO2:      Weight:  88.5 kg    Height:        Wt Readings from Last 3 Encounters:  08/10/22 88.5 kg  06/06/22 82.6 kg  05/02/22 80.7 kg    No intake or output data in the 24 hours ending 08/10/22 1007   Physical  Exam  Awake Alert, No new F.N deficits, Normal affect [place].AT,PERRAL Supple Neck, No JVD,   Symmetrical Chest wall movement, Good air movement bilaterally, CTAB RRR,No Gallops,Rubs or new Murmurs,  +ve B.Sounds, Abd Soft, No tenderness,   No Cyanosis, Clubbing or edema       Data Review:    Recent Labs  Lab 08/09/22 1330 08/10/22 0322  WBC 10.7* 9.3  HGB 11.0* 9.9*  HCT 34.7* 30.9*  PLT 307 249  MCV 99.4 97.5  MCH 31.5 31.2  MCHC 31.7 32.0  RDW 13.2 13.2  LYMPHSABS 0.5*  --   MONOABS 0.5  --   EOSABS 0.0  --   BASOSABS 0.1  --     Recent Labs  Lab 08/09/22 1330 08/10/22 0322  NA 139 138  K 4.7 3.9  CL 99 100  CO2 28 27  ANIONGAP 12 11  GLUCOSE 108* 80  BUN 12 11  CREATININE 1.12 1.15  AST 23 19  ALT 18 16  ALKPHOS 90 84  BILITOT 0.7 0.7  ALBUMIN 3.2* 2.9*  INR 1.2  --   CALCIUM 9.2 8.7*      Radiology Reports CT L-SPINE NO CHARGE  Result Date: 08/09/2022 CLINICAL DATA:  Abdominal pain.  Status post fall EXAM: CT LUMBAR SPINE WITHOUT CONTRAST TECHNIQUE: Multidetector CT imaging of the lumbar spine was performed without intravenous contrast administration. Multiplanar CT image reconstructions were also generated. RADIATION DOSE REDUCTION: This exam was performed according to the departmental dose-optimization program which includes automated exposure control, adjustment of the mA and/or kV according to patient size and/or use of iterative reconstruction technique. COMPARISON:  Abdominal MRI from 07/27/2022 and CT chest 06/21/2022. FINDINGS: Segmentation: 5 lumbar type vertebrae. Alignment: Mild curvature of the lumbar spine is convex towards the left. Vertebrae: Acute to subacute fracture deformity is identified involving the L2 vertebral body. This is new when compared with the MRI from 07/27/2022. Loss of approximately 50% of the central vertebral body height is identified. Mild retropulsion is identified along the posterior aspect of the superior endplate, image  56/4. L3 mild inferior endplate compression deformity is unchanged when compared with the MRI from 07/27/22 with loss of approximately 10% of the central vertebral body height. Chronic fracture deformity involving the L4 vertebral body is again noted with loss of approximately 70% of the central vertebral body height. Paraspinal and other soft tissues: Negative. Disc levels: Disc spaces are relatively well preserved. No significant degenerative disc disease. IMPRESSION: 1. Acute to subacute fracture deformity of the L2 vertebral body is new when compared with the MRI from 07/27/2022. Loss of approximately 50% of the central vertebral body height is identified. Mild retropulsion is identified along the posterior aspect of the superior endplate. 2. Chronic fracture deformity involving the L4 vertebral body is again noted with loss of  approximately 70% of the central vertebral body height. 3. Age-indeterminate L3 inferior endplate compression deformity is unchanged when compared with the MRI from 07/27/22. Electronically Signed   By: Kerby Moors M.D.   On: 08/09/2022 18:01   CT ABDOMEN PELVIS W CONTRAST  Result Date: 08/09/2022 CLINICAL DATA:  Acute abdominal pain EXAM: CT ABDOMEN AND PELVIS WITH CONTRAST TECHNIQUE: Multidetector CT imaging of the abdomen and pelvis was performed using the standard protocol following bolus administration of intravenous contrast. RADIATION DOSE REDUCTION: This exam was performed according to the departmental dose-optimization program which includes automated exposure control, adjustment of the mA and/or kV according to patient size and/or use of iterative reconstruction technique. CONTRAST:  27mL OMNIPAQUE IOHEXOL 350 MG/ML SOLN COMPARISON:  PET-CT 08/08/2021. MRI abdomen 07/27/2022. Lumbar spine MRI 06/06/2020. FINDINGS: Lower chest: Emphysema present Hepatobiliary: No focal liver abnormality is seen. No gallstones, gallbladder wall thickening, or biliary dilatation. Pancreas:  There is a 1.7 x 1.7 cm low-attenuation/cystic area in the tail the pancreas which appears stable. No acute inflammation or ductal dilatation. Spleen: Normal in size without focal abnormality. Adrenals/Urinary Tract: Right inferior pole cyst measures 15 mm. Otherwise, the kidneys, adrenal glands and bladder are within normal limits. Stomach/Bowel: Stomach is within normal limits. Appendix is not seen. No evidence of bowel wall thickening, distention, or inflammatory changes. Vascular/Lymphatic: Aortic atherosclerosis. No enlarged abdominal or pelvic lymph nodes. Reproductive: Prostate gland is mildly enlarged. Other: Left inguinal hernia containing colon is moderate in size and has increased from prior study. There is a small right inguinal hernia containing bladder, unchanged. There is no ascites. Musculoskeletal: Chronic compression fracture of L4 is unchanged. There is a acute appearing compression fracture of L2 with mild retropulsion of fracture fragments (30% vertebral body height loss). IMPRESSION: 1. Acute appearing compression fracture of L2 with mild retropulsion of fracture fragments. Please correlate clinically. 2. Left inguinal hernia containing colon has increased in size. No bowel obstruction. 3. Stable right inguinal hernia containing bladder. 4. 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 Aortic Atherosclerosis (ICD10-I70.0). Electronically Signed   By: Ronney Asters M.D.   On: 08/09/2022 17:56      Signature  -   Lala Lund M.D on 08/10/2022 at 10:07 AM   -  To page go to [email or web address]

## 2022-08-10 NOTE — Progress Notes (Signed)
Echo attempted. Patient using restroom. Will attempt again as schedule permits.

## 2022-08-10 NOTE — Progress Notes (Signed)
Contacted by [facility] for patient's spine fractures showing new subacute compression fracture. Will notify our spine specialists either Dr. [name] or Dr. Rolena Infante to see if they are able to see the patient/review imaging. while inpatient for GI bleed for consideration of any intervention.  He was scheduled on 08/13/22 with Dr. [name], has seen Dr. [name] in the past. Nothing urgent in terms of treatment at this time, continue supportive measures.

## 2022-08-10 NOTE — Progress Notes (Signed)
Pt refuses MDI treatment adv will have wife bring MDI from home. Pt tolerating 4L [place] and stable. Will continue to monitor

## 2022-08-10 NOTE — Progress Notes (Signed)
  Echocardiogram 2D Echocardiogram has been performed.  Francisco [name] 08/10/2022, 3:54 PM

## 2022-08-11 DIAGNOSIS — D62 Acute posthemorrhagic anemia: Secondary | ICD-10-CM | POA: Diagnosis not present

## 2022-08-11 DIAGNOSIS — K922 Gastrointestinal hemorrhage, unspecified: Secondary | ICD-10-CM | POA: Diagnosis not present

## 2022-08-11 DIAGNOSIS — K921 Melena: Secondary | ICD-10-CM

## 2022-08-11 DIAGNOSIS — M545 Low back pain, unspecified: Secondary | ICD-10-CM | POA: Diagnosis not present

## 2022-08-11 DIAGNOSIS — J449 Chronic obstructive pulmonary disease, unspecified: Secondary | ICD-10-CM | POA: Diagnosis not present

## 2022-08-11 HISTORY — DX: Melena: K92.1

## 2022-08-11 LAB — CBC
HCT: 31.8 % — ABNORMAL LOW (ref 39.0–52.0)
Hemoglobin: 10.3 g/dL — ABNORMAL LOW (ref 13.0–17.0)
MCH: 31.6 pg (ref 26.0–34.0)
MCHC: 32.4 g/dL (ref 30.0–36.0)
MCV: 97.5 fL (ref 80.0–100.0)
Platelets: 230 10*3/uL (ref 150–400)
RBC: 3.26 MIL/uL — ABNORMAL LOW (ref 4.22–5.81)
RDW: 13.1 % (ref 11.5–15.5)
WBC: 14.1 10*3/uL — ABNORMAL HIGH (ref 4.0–10.5)
nRBC: 0 % (ref 0.0–0.2)

## 2022-08-11 LAB — CBC WITH DIFFERENTIAL/PLATELET
Abs Immature Granulocytes: 0.09 10*3/uL — ABNORMAL HIGH (ref 0.00–0.07)
Basophils Absolute: 0 10*3/uL (ref 0.0–0.1)
Basophils Relative: 0 %
Eosinophils Absolute: 0.3 10*3/uL (ref 0.0–0.5)
Eosinophils Relative: 3 %
HCT: 29.4 % — ABNORMAL LOW (ref 39.0–52.0)
Hemoglobin: 9.7 g/dL — ABNORMAL LOW (ref 13.0–17.0)
Immature Granulocytes: 1 %
Lymphocytes Relative: 8 %
Lymphs Abs: 0.7 10*3/uL (ref 0.7–4.0)
MCH: 31.7 pg (ref 26.0–34.0)
MCHC: 33 g/dL (ref 30.0–36.0)
MCV: 96.1 fL (ref 80.0–100.0)
Monocytes Absolute: 1 10*3/uL (ref 0.1–1.0)
Monocytes Relative: 10 %
Neutro Abs: 7.5 10*3/uL (ref 1.7–7.7)
Neutrophils Relative %: 78 %
Platelets: 222 10*3/uL (ref 150–400)
RBC: 3.06 MIL/uL — ABNORMAL LOW (ref 4.22–5.81)
RDW: 13.2 % (ref 11.5–15.5)
WBC: 9.6 10*3/uL (ref 4.0–10.5)
nRBC: 0 % (ref 0.0–0.2)

## 2022-08-11 LAB — BASIC METABOLIC PANEL
Anion gap: 7 (ref 5–15)
BUN: 16 mg/dL (ref 8–23)
CO2: 29 mmol/L (ref 22–32)
Calcium: 8.4 mg/dL — ABNORMAL LOW (ref 8.9–10.3)
Chloride: 102 mmol/L (ref 98–111)
Creatinine, Ser: 1.16 mg/dL (ref 0.61–1.24)
GFR, Estimated: >60 mL/min (ref >60)
Glucose, Bld: 95 mg/dL (ref 70–99)
Potassium: 4.1 mmol/L (ref 3.5–5.1)
Sodium: 138 mmol/L (ref 135–145)

## 2022-08-11 LAB — BRAIN NATRIURETIC PEPTIDE: B Natriuretic Peptide: 104.9 pg/mL — ABNORMAL HIGH (ref 0.0–100.0)

## 2022-08-11 LAB — MAGNESIUM: Magnesium: 1.9 mg/dL (ref 1.7–2.4)

## 2022-08-11 MED ORDER — MAGNESIUM HYDROXIDE 400 MG/5ML PO SUSP
30.0000 mL | Freq: Two times a day (BID) | ORAL | Status: AC
  Administered 2022-08-11 (×2): 30 mL via ORAL
  Filled 2022-08-11 (×2): qty 30

## 2022-08-11 MED ORDER — NALOXEGOL OXALATE 25 MG PO TABS
25.0000 mg | ORAL_TABLET | Freq: Once | ORAL | Status: AC
  Administered 2022-08-11: 25 mg via ORAL
  Filled 2022-08-11 (×2): qty 1

## 2022-08-11 MED ORDER — METOPROLOL TARTRATE 25 MG PO TABS
25.0000 mg | ORAL_TABLET | Freq: Two times a day (BID) | ORAL | Status: DC
  Administered 2022-08-11 – 2022-08-13 (×4): 25 mg via ORAL
  Filled 2022-08-11 (×4): qty 1

## 2022-08-11 MED ORDER — POTASSIUM CHLORIDE CRYS ER 20 MEQ PO TBCR
20.0000 meq | EXTENDED_RELEASE_TABLET | Freq: Once | ORAL | Status: AC
  Administered 2022-08-11: 20 meq via ORAL
  Filled 2022-08-11: qty 1

## 2022-08-11 MED ORDER — FUROSEMIDE 10 MG/ML IJ SOLN
20.0000 mg | Freq: Once | INTRAMUSCULAR | Status: AC
  Administered 2022-08-11: 20 mg via INTRAVENOUS
  Filled 2022-08-11: qty 2

## 2022-08-11 MED ORDER — METOPROLOL TARTRATE 5 MG/5ML IV SOLN
5.0000 mg | Freq: Three times a day (TID) | INTRAVENOUS | Status: DC | PRN
  Administered 2022-08-16: 5 mg via INTRAVENOUS
  Filled 2022-08-11: qty 5

## 2022-08-11 NOTE — Progress Notes (Signed)
PROGRESS NOTE                                                                                                                                                                                                             Patient Demographics:    Francisco [name], is a 77 y.o. male, DOB - [birth year], PIR:[id number]  Outpatient Primary MD for the patient is [name], MD    LOS - 1  Admit date - 08/09/2022    Chief Complaint  Patient presents with   GI Bleeding       Brief Narrative (HPI from H&P)   77 y.o. male with medical history significant of COPD, venous stasis, PAD, anemia, hyperlipidemia, history of GI bleed with AVM, BPH, lung cancer on observation, essential tremor, hypertension, CHF, PE on Eliquis presenting with dark stools from PCP office.   Patient has had dark stools for the past 2 days.  Is on iron therapy but PCP felt he looked pale and sent him to the ED for further evaluation for possible GI bleed.  This is in the setting of history of known prior GI bleed and currently on Eliquis.  Patient additionally had a fall 2 weeks ago and has had some low back pain since that time.  Additionally is reporting some constipation.  In the ER he was found to have another L-spine compression fracture acute on chronic, of note he was due to be seen at [facility] in the next coming days, he was also found to be anemic and admitted to the hospital for pain control and anemia treatment.   Subjective:   Patient in bed, appears comfortable, denies any headache, no fever, no chest pain or pressure, no shortness of breath , no abdominal pain. No new focal weakness, improved acute on chronic low back pain.   Assessment  & Plan :   Upper GI bleed with bleeding, with acute blood loss related anemia.  Has heme positive and melanotic stools, currently on IV PPI, H&H closely being monitored, type screen done patient is agreeable  for transfusion if needed.  GI on board likely EGD 08/12/2022, monitoring closely.  Has previously undergone EGD by Dr. Rush Landmark.   Acute on chronic low back pain.  Has history of L-spine fractures in the past, had fall 2 weeks ago after which looks like  he has a subacute L2 compression fracture as well.  Currently pain control, PT OT, [facility] consulted who are to see the patient shortly.  Case discussed with orthopedics on-call Dr. Thereasa Solo continue medical treatment with TLSO brace when out of bed, one of the orthopedic surgeons will see the patient on 08/12/2022 most likely all management is medical with outpatient follow-up.  No urgent surgical intervention needed.  Severe underlying COPD with cor pulmonale and pulmonary hypertension.  He is on 4 to 5 L of oxygen at all times, currently also has evidence of mild fluid overload in the lungs, continue home nebulizer treatments, oxygen, encouraged to sit up in chair use I-S and flutter valve for pulmonary toiletry.  Will diurese on top of his lung medications and monitor.  Acute on chronic diastolic CHF EF 23%.  Does have evidence of fluid overload continue gentle diuresis and monitor.  Has been placed on beta-blocker.  PAD.  No acute issues.  Continue statin for secondary prevention.  History of PE.  On Eliquis currently on hold due to GI bleed, this has been at least 2 years or more.  Dyslipidemia.  On statin.  History of lung cancer.  Outpatient follow-up with his pulmonologist [name] and his oncologist.        Condition - Extremely Guarded  Family Communication  :  wife on 08/10/22  Code Status :  DNR  Consults  :  GI, Ortho  PUD Prophylaxis : PPI   Procedures  :     Echocardiogram.  1. Left ventricular ejection fraction, by estimation, is 55 to 60%. The left ventricle has normal function. The left ventricle has no regional wall motion abnormalities. Left ventricular diastolic function could not be evaluated.  2.  Right ventricular systolic function is normal. The right ventricular size is normal.  3. A small pericardial effusion is present.  4. No evidence of mitral valve regurgitation.  5. The aortic valve was not well visualized. Aortic valve regurgitation is not visualized.  6. Not well visualized.  7. The inferior vena cava is normal in size with greater than 50% respiratory variability, suggesting right atrial pressure of 3 mmHg.  CT scan abdomen pelvis.  1. Acute appearing compression fracture of L2 with mild retropulsion of fracture fragments. Please correlate clinically. 2. Left inguinal hernia containing colon has increased in size. No bowel obstruction. 3. Stable right inguinal hernia containing bladder. 4. 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 Aortic Atherosclerosis (ICD10-I70.0).  CT L-spine. - 1. Acute to subacute fracture deformity of the L2 vertebral body is new when compared with the MRI from 07/27/2022. Loss of approximately 50% of the central vertebral body height is identified. Mild retropulsion is identified along the posterior aspect of the superior endplate. 2. Chronic fracture deformity involving the L4 vertebral body is again noted with loss of approximately 70% of the central vertebral body height. 3. Age-indeterminate L3 inferior endplate compression deformity is unchanged when compared with the MRI from 07/27/22      Disposition Plan  :    Status is: Inpt  DVT Prophylaxis  :    Place and maintain sequential compression device Start: 08/10/22 0554 SCDs Start: 08/09/22 1844    Lab Results  Component Value Date   PLT 222 08/11/2022    Diet :  Diet Order             Diet regular Room service appropriate? Yes; Fluid consistency: Thin  Diet effective now  Inpatient Medications  Scheduled Meds:  acetaminophen  1,000 mg Oral Q6H   docusate sodium  200 mg Oral BID   fentaNYL  1 patch Transdermal Q72H   ferrous sulfate  325 mg Oral QODAY    magnesium hydroxide  30 mL Oral BID   metoprolol tartrate  25 mg Oral BID   mometasone-formoterol  2 puff Inhalation BID   pantoprazole (PROTONIX) IV  40 mg Intravenous Q12H   polyethylene glycol  17 g Oral Daily   potassium chloride  20 mEq Oral Once   predniSONE  10 mg Oral Q breakfast   roflumilast  500 mcg Oral Daily   rosuvastatin  10 mg Oral Daily   sodium chloride flush  3 mL Intravenous Q12H   umeclidinium bromide  1 puff Inhalation Daily   Continuous Infusions: PRN Meds:.albuterol, bisacodyl, metoprolol tartrate, morphine injection, oxyCODONE  Antibiotics  :    Anti-infectives (From admission, onward)    None         Objective:   Vitals:   08/10/22 2100 08/10/22 2330 08/11/22 0430 08/11/22 0530  BP:  127/63 (!) 122/59   Pulse:  (!) 108 (!) 106   Resp:  17 16   Temp:  97.6 F (36.4 C) 97.7 F (36.5 C)   TempSrc:  Oral Oral   SpO2: 96%     Weight:    89.3 kg  Height:        Wt Readings from Last 3 Encounters:  08/11/22 89.3 kg  06/06/22 82.6 kg  05/02/22 80.7 kg     Intake/Output Summary (Last 24 hours) at 08/11/2022 0828 Last data filed at 08/10/2022 2100 Gross per 24 hour  Intake --  Output 350 ml  Net -350 ml     Physical Exam  Awake Alert, No new F.N deficits, Normal affect [place].AT,PERRAL Supple Neck, No JVD,   Symmetrical Chest wall movement, Good air movement bilaterally, few rales RRR,No Gallops, Rubs or new Murmurs,  +ve B.Sounds, Abd Soft, No tenderness,   2+ edema       Data Review:    Recent Labs  Lab 08/09/22 1330 08/10/22 0322 08/10/22 1123 08/10/22 1919 08/11/22 0531  WBC 10.7* 9.3 16.0*  --  9.6  HGB 11.0* 9.9* 11.0* 10.9* 9.7*  HCT 34.7* 30.9* 34.7* 33.0* 29.4*  PLT 307 249 318  --  222  MCV 99.4 97.5 96.9  --  96.1  MCH 31.5 31.2 30.7  --  31.7  MCHC 31.7 32.0 31.7  --  33.0  RDW 13.2 13.2 13.1  --  13.2  LYMPHSABS 0.5*  --   --   --  0.7  MONOABS 0.5  --   --   --  1.0  EOSABS 0.0  --   --   --  0.3  BASOSABS  0.1  --   --   --  0.0    Recent Labs  Lab 08/09/22 1330 08/10/22 0322 08/10/22 1123 08/11/22 0531  NA 139 138  --  138  K 4.7 3.9  --  4.1  CL 99 100  --  102  CO2 28 27  --  29  ANIONGAP 12 11  --  7  GLUCOSE 108* 80  --  95  BUN 12 11  --  16  CREATININE 1.12 1.15  --  1.16  AST 23 19  --   --   ALT 18 16  --   --   ALKPHOS 90 84  --   --  BILITOT 0.7 0.7  --   --   ALBUMIN 3.2* 2.9*  --   --   INR 1.2  --   --   --   TSH  --   --  2.962  --   BNP  --   --   --  104.9*  MG  --   --  1.9 1.9  CALCIUM 9.2 8.7*  --  8.4*      Radiology Reports ECHOCARDIOGRAM COMPLETE  Result Date: 08/10/2022    ECHOCARDIOGRAM REPORT   Patient Name:   JAKARRI LESKO II Date of Exam: 08/10/2022 Medical Rec #:  [id number]         Height:       72.0 in Accession #:    [id number]        Weight:       195.1 lb Date of Birth:  [birth year]         BSA:          2.108 m Patient Age:    27 years          BP:           151/78 mmHg Patient Gender: M                 HR:           116 bpm. Exam Location:  Inpatient Procedure: 2D Echo, Color Doppler, Cardiac Doppler and Intracardiac            Opacification Agent Indications:    CHF - Acute Diastolic  History:        Patient has prior history of Echocardiogram examinations, most                 recent 11/04/2017. CHF, PAD and COPD; Signs/Symptoms:Hypotension,                 Edema and Dyspnea. Lung cancer, pulmonary embolism.  Sonographer:    Eartha Inch Referring Phys: Graylin Shiver [facility]  Sonographer Comments: Technically difficult study due to poor echo windows. Image acquisition challenging due to patient body habitus and Image acquisition challenging due to respiratory motion. IMPRESSIONS  1. Left ventricular ejection fraction, by estimation, is 55 to 60%. The left ventricle has normal function. The left ventricle has no regional wall motion abnormalities. Left ventricular diastolic function could not be evaluated.  2. Right ventricular systolic function is  normal. The right ventricular size is normal.  3. A small pericardial effusion is present.  4. No evidence of mitral valve regurgitation.  5. The aortic valve was not well visualized. Aortic valve regurgitation is not visualized.  6. Not well visualized.  7. The inferior vena cava is normal in size with greater than 50% respiratory variability, suggesting right atrial pressure of 3 mmHg. FINDINGS  Left Ventricle: Left ventricular ejection fraction, by estimation, is 55 to 60%. The left ventricle has normal function. The left ventricle has no regional wall motion abnormalities. Definity contrast agent was given IV to delineate the left ventricular  endocardial borders. The left ventricular internal cavity size was normal in size. There is no left ventricular hypertrophy. Left ventricular diastolic function could not be evaluated. Right Ventricle: The right ventricular size is normal. Right ventricular systolic function is normal. Left Atrium: Left atrial size was normal in size. Right Atrium: Right atrial size was normal in size. Pericardium: A small pericardial effusion is present. Mitral Valve: No evidence of mitral valve regurgitation. Tricuspid Valve: Tricuspid valve  regurgitation is not demonstrated. Aortic Valve: The aortic valve was not well visualized. Aortic valve regurgitation is not visualized. Pulmonic Valve: Pulmonic valve regurgitation is not visualized. Aorta: Not well visualized. Venous: The inferior vena cava is normal in size with greater than 50% respiratory variability, suggesting right atrial pressure of 3 mmHg. IAS/Shunts: The interatrial septum was not well visualized.  LEFT VENTRICLE PLAX 2D LVIDd:         4.00 cm LVIDs:         3.10 cm LV PW:         1.10 cm LV IVS:        0.90 cm  LEFT ATRIUM           Index LA diam:      3.50 cm 1.66 cm/m LA Vol (A4C): 25.1 ml 11.91 ml/m Phineas Inches Electronically signed by Phineas Inches Signature Date/Time: 08/10/2022/5:09:21 PM    Final    CT L-SPINE NO  CHARGE  Result Date: 08/09/2022 CLINICAL DATA:  Abdominal pain.  Status post fall EXAM: CT LUMBAR SPINE WITHOUT CONTRAST TECHNIQUE: Multidetector CT imaging of the lumbar spine was performed without intravenous contrast administration. Multiplanar CT image reconstructions were also generated. RADIATION DOSE REDUCTION: This exam was performed according to the departmental dose-optimization program which includes automated exposure control, adjustment of the mA and/or kV according to patient size and/or use of iterative reconstruction technique. COMPARISON:  Abdominal MRI from 07/27/2022 and CT chest 06/21/2022. FINDINGS: Segmentation: 5 lumbar type vertebrae. Alignment: Mild curvature of the lumbar spine is convex towards the left. Vertebrae: Acute to subacute fracture deformity is identified involving the L2 vertebral body. This is new when compared with the MRI from 07/27/2022. Loss of approximately 50% of the central vertebral body height is identified. Mild retropulsion is identified along the posterior aspect of the superior endplate, image 56/4. L3 mild inferior endplate compression deformity is unchanged when compared with the MRI from 07/27/22 with loss of approximately 10% of the central vertebral body height. Chronic fracture deformity involving the L4 vertebral body is again noted with loss of approximately 70% of the central vertebral body height. Paraspinal and other soft tissues: Negative. Disc levels: Disc spaces are relatively well preserved. No significant degenerative disc disease. IMPRESSION: 1. Acute to subacute fracture deformity of the L2 vertebral body is new when compared with the MRI from 07/27/2022. Loss of approximately 50% of the central vertebral body height is identified. Mild retropulsion is identified along the posterior aspect of the superior endplate. 2. Chronic fracture deformity involving the L4 vertebral body is again noted with loss of approximately 70% of the central vertebral  body height. 3. Age-indeterminate L3 inferior endplate compression deformity is unchanged when compared with the MRI from 07/27/22. Electronically Signed   By: Kerby Moors M.D.   On: 08/09/2022 18:01   CT ABDOMEN PELVIS W CONTRAST  Result Date: 08/09/2022 CLINICAL DATA:  Acute abdominal pain EXAM: CT ABDOMEN AND PELVIS WITH CONTRAST TECHNIQUE: Multidetector CT imaging of the abdomen and pelvis was performed using the standard protocol following bolus administration of intravenous contrast. RADIATION DOSE REDUCTION: This exam was performed according to the departmental dose-optimization program which includes automated exposure control, adjustment of the mA and/or kV according to patient size and/or use of iterative reconstruction technique. CONTRAST:  13mL OMNIPAQUE IOHEXOL 350 MG/ML SOLN COMPARISON:  PET-CT 08/08/2021. MRI abdomen 07/27/2022. Lumbar spine MRI 06/06/2020. FINDINGS: Lower chest: Emphysema present Hepatobiliary: No focal liver abnormality is seen. No gallstones, gallbladder wall thickening, or biliary  dilatation. Pancreas: There is a 1.7 x 1.7 cm low-attenuation/cystic area in the tail the pancreas which appears stable. No acute inflammation or ductal dilatation. Spleen: Normal in size without focal abnormality. Adrenals/Urinary Tract: Right inferior pole cyst measures 15 mm. Otherwise, the kidneys, adrenal glands and bladder are within normal limits. Stomach/Bowel: Stomach is within normal limits. Appendix is not seen. No evidence of bowel wall thickening, distention, or inflammatory changes. Vascular/Lymphatic: Aortic atherosclerosis. No enlarged abdominal or pelvic lymph nodes. Reproductive: Prostate gland is mildly enlarged. Other: Left inguinal hernia containing colon is moderate in size and has increased from prior study. There is a small right inguinal hernia containing bladder, unchanged. There is no ascites. Musculoskeletal: Chronic compression fracture of L4 is unchanged. There is a  acute appearing compression fracture of L2 with mild retropulsion of fracture fragments (30% vertebral body height loss). IMPRESSION: 1. Acute appearing compression fracture of L2 with mild retropulsion of fracture fragments. Please correlate clinically. 2. Left inguinal hernia containing colon has increased in size. No bowel obstruction. 3. Stable right inguinal hernia containing bladder. 4. 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 Aortic Atherosclerosis (ICD10-I70.0). Electronically Signed   By: Ronney Asters M.D.   On: 08/09/2022 17:56      Signature  -   Lala Lund M.D on 08/11/2022 at 8:28 AM   -  To page go to [email or web address]

## 2022-08-11 NOTE — Plan of Care (Signed)

## 2022-08-11 NOTE — Progress Notes (Addendum)
Patient ID: Francisco [name], male   DOB: 08-Jun-1946, 77 y.o.   MRN: [id number]    Progress Note   Subjective   Day # 3 CC; melena in setting of chronic Eliquis  Patient has not had any further melena today or last night actually has not had a bowel movement in a couple of days  WBC 9.6/hemoglobin 9.7/hematocrit 29.4-has continued to drift, no transfusion requirement BNP 104 BUN 16/creatinine 1.16   Objective   Vital signs in last 24 hours: Temp:  [97.4 F (36.3 C)-97.8 F (36.6 C)] 97.7 F (36.5 C) (02/04 0430) Pulse Rate:  [106-126] 126 (02/04 1100) Resp:  [15-20] 20 (02/04 1100) BP: (107-151)/(59-78) 128/65 (02/04 1100) SpO2:  [96 %-100 %] 96 % (02/04 1100) Weight:  [89.3 kg] 89.3 kg (02/04 0530) Last BM Date :  (pta) General:    Older white male in NAD on 4 L nasal cannula no increased work of breathing but breath sounds at Heart: tachy  Regular rate and rhythm; no murmurs Lungs: Decreased breath sounds bilateral, with diffuse tattered rhonchi no rails Abdomen:  Soft, nontender and nondistended. Normal bowel sounds. Extremities:  Without edema. Neurologic:  Alert and oriented,  grossly normal neurologically. Psych:  Cooperative. Normal mood and affect.  Intake/Output from previous day: 02/03 0701 - 02/04 0700 In: -  Out: 350 [Urine:350] Intake/Output this shift: No intake/output data recorded.  Lab Results: Recent Labs    08/10/22 0322 08/10/22 1123 08/10/22 1919 08/11/22 0531  WBC 9.3 16.0*  --  9.6  HGB 9.9* 11.0* 10.9* 9.7*  HCT 30.9* 34.7* 33.0* 29.4*  PLT 249 318  --  222   BMET Recent Labs    08/09/22 1330 08/10/22 0322 08/11/22 0531  NA 139 138 138  K 4.7 3.9 4.1  CL 99 100 102  CO2 28 27 29   GLUCOSE 108* 80 95  BUN 12 11 16   CREATININE 1.12 1.15 1.16  CALCIUM 9.2 8.7* 8.4*   LFT Recent Labs    08/10/22 0322  PROT 5.6*  ALBUMIN 2.9*  AST 19  ALT 16  ALKPHOS 84  BILITOT 0.7   PT/INR Recent Labs    08/09/22 1330  LABPROT  14.8  INR 1.2    Studies/Results: ECHOCARDIOGRAM COMPLETE  Result Date: 08/10/2022    ECHOCARDIOGRAM REPORT   Patient Name:   Francisco [name] Date of Exam: 08/10/2022 Medical Rec #:  [id number]         Height:       72.0 in Accession #:    [id number]        Weight:       195.1 lb Date of Birth:  1945-10-11         BSA:          2.108 m Patient Age:    61 years          BP:           151/78 mmHg Patient Gender: M                 HR:           116 bpm. Exam Location:  Inpatient Procedure: 2D Echo, Color Doppler, Cardiac Doppler and Intracardiac            Opacification Agent Indications:    CHF - Acute Diastolic  History:        Patient has prior history of Echocardiogram examinations, most  recent 11/04/2017. CHF, PAD and COPD; Signs/Symptoms:Hypotension,                 Edema and Dyspnea. Lung cancer, pulmonary embolism.  Sonographer:    Eartha Inch Referring Phys: Graylin Shiver [facility]  Sonographer Comments: Technically difficult study due to poor echo windows. Image acquisition challenging due to patient body habitus and Image acquisition challenging due to respiratory motion. IMPRESSIONS  1. Left ventricular ejection fraction, by estimation, is 55 to 60%. The left ventricle has normal function. The left ventricle has no regional wall motion abnormalities. Left ventricular diastolic function could not be evaluated.  2. Right ventricular systolic function is normal. The right ventricular size is normal.  3. A small pericardial effusion is present.  4. No evidence of mitral valve regurgitation.  5. The aortic valve was not well visualized. Aortic valve regurgitation is not visualized.  6. Not well visualized.  7. The inferior vena cava is normal in size with greater than 50% respiratory variability, suggesting right atrial pressure of 3 mmHg. FINDINGS  Left Ventricle: Left ventricular ejection fraction, by estimation, is 55 to 60%. The left ventricle has normal function. The left ventricle has  no regional wall motion abnormalities. Definity contrast agent was given IV to delineate the left ventricular  endocardial borders. The left ventricular internal cavity size was normal in size. There is no left ventricular hypertrophy. Left ventricular diastolic function could not be evaluated. Right Ventricle: The right ventricular size is normal. Right ventricular systolic function is normal. Left Atrium: Left atrial size was normal in size. Right Atrium: Right atrial size was normal in size. Pericardium: A small pericardial effusion is present. Mitral Valve: No evidence of mitral valve regurgitation. Tricuspid Valve: Tricuspid valve regurgitation is not demonstrated. Aortic Valve: The aortic valve was not well visualized. Aortic valve regurgitation is not visualized. Pulmonic Valve: Pulmonic valve regurgitation is not visualized. Aorta: Not well visualized. Venous: The inferior vena cava is normal in size with greater than 50% respiratory variability, suggesting right atrial pressure of 3 mmHg. IAS/Shunts: The interatrial septum was not well visualized.  LEFT VENTRICLE PLAX 2D LVIDd:         4.00 cm LVIDs:         3.10 cm LV PW:         1.10 cm LV IVS:        0.90 cm  LEFT ATRIUM           Index LA diam:      3.50 cm 1.66 cm/m LA Vol (A4C): 25.1 ml 11.91 ml/m Phineas Inches Electronically signed by Phineas Inches Signature Date/Time: 08/10/2022/5:09:21 PM    Final    CT L-SPINE NO CHARGE  Result Date: 08/09/2022 CLINICAL DATA:  Abdominal pain.  Status post fall EXAM: CT LUMBAR SPINE WITHOUT CONTRAST TECHNIQUE: Multidetector CT imaging of the lumbar spine was performed without intravenous contrast administration. Multiplanar CT image reconstructions were also generated. RADIATION DOSE REDUCTION: This exam was performed according to the departmental dose-optimization program which includes automated exposure control, adjustment of the mA and/or kV according to patient size and/or use of iterative reconstruction  technique. COMPARISON:  Abdominal MRI from 07/27/2022 and CT chest 06/21/2022. FINDINGS: Segmentation: 5 lumbar type vertebrae. Alignment: Mild curvature of the lumbar spine is convex towards the left. Vertebrae: Acute to subacute fracture deformity is identified involving the L2 vertebral body. This is new when compared with the MRI from 07/27/2022. Loss of approximately 50% of the central vertebral body height is  identified. Mild retropulsion is identified along the posterior aspect of the superior endplate, image 56/4. L3 mild inferior endplate compression deformity is unchanged when compared with the MRI from 07/27/22 with loss of approximately 10% of the central vertebral body height. Chronic fracture deformity involving the L4 vertebral body is again noted with loss of approximately 70% of the central vertebral body height. Paraspinal and other soft tissues: Negative. Disc levels: Disc spaces are relatively well preserved. No significant degenerative disc disease. IMPRESSION: 1. Acute to subacute fracture deformity of the L2 vertebral body is new when compared with the MRI from 07/27/2022. Loss of approximately 50% of the central vertebral body height is identified. Mild retropulsion is identified along the posterior aspect of the superior endplate. 2. Chronic fracture deformity involving the L4 vertebral body is again noted with loss of approximately 70% of the central vertebral body height. 3. Age-indeterminate L3 inferior endplate compression deformity is unchanged when compared with the MRI from 07/27/22. Electronically Signed   By: Kerby Moors M.D.   On: 08/09/2022 18:01   CT ABDOMEN PELVIS W CONTRAST  Result Date: 08/09/2022 CLINICAL DATA:  Acute abdominal pain EXAM: CT ABDOMEN AND PELVIS WITH CONTRAST TECHNIQUE: Multidetector CT imaging of the abdomen and pelvis was performed using the standard protocol following bolus administration of intravenous contrast. RADIATION DOSE REDUCTION: This exam was  performed according to the departmental dose-optimization program which includes automated exposure control, adjustment of the mA and/or kV according to patient size and/or use of iterative reconstruction technique. CONTRAST:  73mL OMNIPAQUE IOHEXOL 350 MG/ML SOLN COMPARISON:  PET-CT 08/08/2021. MRI abdomen 07/27/2022. Lumbar spine MRI 06/06/2020. FINDINGS: Lower chest: Emphysema present Hepatobiliary: No focal liver abnormality is seen. No gallstones, gallbladder wall thickening, or biliary dilatation. Pancreas: There is a 1.7 x 1.7 cm low-attenuation/cystic area in the tail the pancreas which appears stable. No acute inflammation or ductal dilatation. Spleen: Normal in size without focal abnormality. Adrenals/Urinary Tract: Right inferior pole cyst measures 15 mm. Otherwise, the kidneys, adrenal glands and bladder are within normal limits. Stomach/Bowel: Stomach is within normal limits. Appendix is not seen. No evidence of bowel wall thickening, distention, or inflammatory changes. Vascular/Lymphatic: Aortic atherosclerosis. No enlarged abdominal or pelvic lymph nodes. Reproductive: Prostate gland is mildly enlarged. Other: Left inguinal hernia containing colon is moderate in size and has increased from prior study. There is a small right inguinal hernia containing bladder, unchanged. There is no ascites. Musculoskeletal: Chronic compression fracture of L4 is unchanged. There is a acute appearing compression fracture of L2 with mild retropulsion of fracture fragments (30% vertebral body height loss). IMPRESSION: 1. Acute appearing compression fracture of L2 with mild retropulsion of fracture fragments. Please correlate clinically. 2. Left inguinal hernia containing colon has increased in size. No bowel obstruction. 3. Stable right inguinal hernia containing bladder. 4. 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 Aortic Atherosclerosis (ICD10-I70.0). Electronically Signed   By: Ronney Asters M.D.   On:  08/09/2022 17:56       Assessment / Plan:    #74 77 year old white male with severe COPD on 4 to 5 L nasal cannula chronically, also with pulmonary hypertension.  He has some mild volume overload this admission, diuresing per medicine service, continuing nebulizers and oxygen, Neri toilet  #2 acute/subacute upper GI bleed with melena at home just prior to admission in setting of chronic Eliquis. Eliquis has been on hold since admission  Globin has continued to slowly drift, down to 9.7 today, not required transfusion,, and actually  has not had a bowel movement over the past couple of days.  He is known to have history of duodenal AVM treated with APC in 2021. Etiology of current melena is not totally clear, he certainly may have recurrent AVMs, consider chronic gastropathy ulcer disease or possibly a lower GI source.  #3 history of PE for which she is on Eliquis-[place] need to continue long-term Eliquis #4 lung cancer currently just on observation #5 peripheral arterial disease 6.  Congestive heart failure 7.  Chronic EtOH use 8.  History of adenomatous colon polyps last colonoscopy was greater than 10 years ago #9-acute lower back pain with acute L2 compression fracture  Plan; okay for regular diet today, will keep n.p.o. in the a.m., until seen by GI Continue IV PPI twice daily Continue to trend hemoglobin and transfuse as needed  Patient is a high risk candidate for endoscopic evaluation due to his severe COPD and underlying pulmonary hypertension.  He is currently being diuresed and increasing pulmonary toilet etc. He may not be ready for EGD tomorrow but will assess in a.m. and if pulmonary status improved could proceed with EGD tomorrow versus waiting another 24 to 48 hours. Did discuss EGD with the patient in detail including indications risk benefits and he is agreeable to proceed.   Need to sort out prior to discharge whether he needs to go back on Eliquis or  not.     Principal Problem:   GI bleed Active Problems:   Hyperlipidemia   BPH associated with nocturia   Chronic respiratory failure with hypoxia (HCC)   Essential hypertension   Essential tremor   Chronic pulmonary embolism (HCC)   Stage 4 very severe COPD by GOLD classification ([place])   PAD (peripheral artery disease) (HCC)   Malignant neoplasm of lower lobe of right lung (HCC)   Iron deficiency anemia due to chronic blood loss   Chronic systolic congestive heart failure ([place])     LOS: 1 day   Aala Ransom PA-C 08/11/2022, 11:20 AM

## 2022-08-12 ENCOUNTER — Encounter (HOSPITAL_COMMUNITY)
Admission: EM | Disposition: A | Discharge status: Skilled Nursing Facility | Payer: Self-pay | Source: Ambulatory Visit | Attending: Internal Medicine

## 2022-08-12 ENCOUNTER — Encounter (HOSPITAL_COMMUNITY): Payer: Self-pay | Admitting: Internal Medicine

## 2022-08-12 ENCOUNTER — Inpatient Hospital Stay (HOSPITAL_COMMUNITY): Payer: Medicare Other | Admitting: Anesthesiology

## 2022-08-12 DIAGNOSIS — K3182 Dieulafoy lesion (hemorrhagic) of stomach and duodenum: Principal | ICD-10-CM

## 2022-08-12 DIAGNOSIS — Z87891 Personal history of nicotine dependence: Secondary | ICD-10-CM | POA: Diagnosis not present

## 2022-08-12 DIAGNOSIS — I739 Peripheral vascular disease, unspecified: Secondary | ICD-10-CM

## 2022-08-12 DIAGNOSIS — I10 Essential (primary) hypertension: Secondary | ICD-10-CM

## 2022-08-12 DIAGNOSIS — K2970 Gastritis, unspecified, without bleeding: Secondary | ICD-10-CM

## 2022-08-12 DIAGNOSIS — K31811 Angiodysplasia of stomach and duodenum with bleeding: Secondary | ICD-10-CM | POA: Diagnosis not present

## 2022-08-12 DIAGNOSIS — K922 Gastrointestinal hemorrhage, unspecified: Secondary | ICD-10-CM | POA: Diagnosis not present

## 2022-08-12 DIAGNOSIS — K921 Melena: Secondary | ICD-10-CM | POA: Diagnosis not present

## 2022-08-12 DIAGNOSIS — D62 Acute posthemorrhagic anemia: Secondary | ICD-10-CM

## 2022-08-12 DIAGNOSIS — K31819 Angiodysplasia of stomach and duodenum without bleeding: Secondary | ICD-10-CM

## 2022-08-12 DIAGNOSIS — M545 Low back pain, unspecified: Secondary | ICD-10-CM | POA: Diagnosis not present

## 2022-08-12 HISTORY — PX: ESOPHAGOGASTRODUODENOSCOPY (EGD) WITH PROPOFOL: SHX5813

## 2022-08-12 HISTORY — PX: HOT HEMOSTASIS: SHX5433

## 2022-08-12 HISTORY — PX: SUBMUCOSAL INJECTION: SHX5543

## 2022-08-12 HISTORY — PX: BIOPSY: SHX5522

## 2022-08-12 HISTORY — PX: HEMOSTASIS CLIP PLACEMENT: SHX6857

## 2022-08-12 LAB — CBC WITH DIFFERENTIAL/PLATELET
Abs Immature Granulocytes: 0.13 10*3/uL — ABNORMAL HIGH (ref 0.00–0.07)
Basophils Absolute: 0.1 10*3/uL (ref 0.0–0.1)
Basophils Relative: 0 %
Eosinophils Absolute: 0.2 10*3/uL (ref 0.0–0.5)
Eosinophils Relative: 2 %
HCT: 32.7 % — ABNORMAL LOW (ref 39.0–52.0)
Hemoglobin: 10.6 g/dL — ABNORMAL LOW (ref 13.0–17.0)
Immature Granulocytes: 1 %
Lymphocytes Relative: 8 %
Lymphs Abs: 0.9 10*3/uL (ref 0.7–4.0)
MCH: 32 pg (ref 26.0–34.0)
MCHC: 32.4 g/dL (ref 30.0–36.0)
MCV: 98.8 fL (ref 80.0–100.0)
Monocytes Absolute: 1.2 10*3/uL — ABNORMAL HIGH (ref 0.1–1.0)
Monocytes Relative: 11 %
Neutro Abs: 9 10*3/uL — ABNORMAL HIGH (ref 1.7–7.7)
Neutrophils Relative %: 78 %
Platelets: 233 10*3/uL (ref 150–400)
RBC: 3.31 MIL/uL — ABNORMAL LOW (ref 4.22–5.81)
RDW: 13.1 % (ref 11.5–15.5)
WBC: 11.5 10*3/uL — ABNORMAL HIGH (ref 4.0–10.5)
nRBC: 0 % (ref 0.0–0.2)

## 2022-08-12 LAB — BASIC METABOLIC PANEL
Anion gap: 9 (ref 5–15)
BUN: 14 mg/dL (ref 8–23)
CO2: 29 mmol/L (ref 22–32)
Calcium: 8.6 mg/dL — ABNORMAL LOW (ref 8.9–10.3)
Chloride: 96 mmol/L — ABNORMAL LOW (ref 98–111)
Creatinine, Ser: 1.1 mg/dL (ref 0.61–1.24)
GFR, Estimated: >60 mL/min (ref >60)
Glucose, Bld: 103 mg/dL — ABNORMAL HIGH (ref 70–99)
Potassium: 4.9 mmol/L (ref 3.5–5.1)
Sodium: 134 mmol/L — ABNORMAL LOW (ref 135–145)

## 2022-08-12 LAB — BRAIN NATRIURETIC PEPTIDE: B Natriuretic Peptide: 147 pg/mL — ABNORMAL HIGH (ref 0.0–100.0)

## 2022-08-12 LAB — OCCULT BLOOD, POC DEVICE: Fecal Occult Bld: POSITIVE — AB

## 2022-08-12 LAB — MAGNESIUM: Magnesium: 2.3 mg/dL (ref 1.7–2.4)

## 2022-08-12 SURGERY — ESOPHAGOGASTRODUODENOSCOPY (EGD) WITH PROPOFOL
Anesthesia: Monitor Anesthesia Care

## 2022-08-12 MED ORDER — PROPOFOL 500 MG/50ML IV EMUL
INTRAVENOUS | Status: DC | PRN
  Administered 2022-08-12: 50 ug/kg/min via INTRAVENOUS

## 2022-08-12 MED ORDER — PHENYLEPHRINE 80 MCG/ML (10ML) SYRINGE FOR IV PUSH (FOR BLOOD PRESSURE SUPPORT)
PREFILLED_SYRINGE | INTRAVENOUS | Status: DC | PRN
  Administered 2022-08-12: 80 ug via INTRAVENOUS
  Administered 2022-08-12 (×2): 160 ug via INTRAVENOUS

## 2022-08-12 MED ORDER — APIXABAN 2.5 MG PO TABS
2.5000 mg | ORAL_TABLET | Freq: Two times a day (BID) | ORAL | Status: DC
  Administered 2022-08-16 – 2022-08-17 (×3): 2.5 mg via ORAL
  Filled 2022-08-12 (×3): qty 1

## 2022-08-12 MED ORDER — FUROSEMIDE 40 MG PO TABS
60.0000 mg | ORAL_TABLET | Freq: Once | ORAL | Status: AC
  Administered 2022-08-12: 60 mg via ORAL
  Filled 2022-08-12: qty 1

## 2022-08-12 MED ORDER — LACTATED RINGERS IV SOLN: INTRAVENOUS | Status: DC | PRN

## 2022-08-12 MED ORDER — SODIUM CHLORIDE (PF) 0.9 % IJ SOLN
PREFILLED_SYRINGE | INTRAMUSCULAR | Status: DC | PRN
  Administered 2022-08-12: 2.5 mL

## 2022-08-12 MED ORDER — PANTOPRAZOLE SODIUM 40 MG PO TBEC
40.0000 mg | DELAYED_RELEASE_TABLET | Freq: Two times a day (BID) | ORAL | Status: DC
  Administered 2022-08-12 – 2022-08-17 (×11): 40 mg via ORAL
  Filled 2022-08-12 (×11): qty 1

## 2022-08-12 MED ORDER — OXYCODONE HCL 5 MG/5ML PO SOLN: 5.0000 mg | Freq: Once | ORAL | Status: DC | PRN

## 2022-08-12 MED ORDER — PROPOFOL 10 MG/ML IV BOLUS
INTRAVENOUS | Status: DC | PRN
  Administered 2022-08-12: 20 mg via INTRAVENOUS
  Administered 2022-08-12: 25 mg via INTRAVENOUS

## 2022-08-12 MED ORDER — ONDANSETRON HCL 4 MG/2ML IJ SOLN: 4.0000 mg | Freq: Four times a day (QID) | INTRAMUSCULAR | Status: DC | PRN

## 2022-08-12 MED ORDER — OXYCODONE HCL 5 MG PO TABS: 5.0000 mg | ORAL_TABLET | Freq: Once | ORAL | Status: DC | PRN

## 2022-08-12 MED ORDER — EPINEPHRINE 1 MG/10ML IJ SOSY
PREFILLED_SYRINGE | INTRAMUSCULAR | Status: AC
  Filled 2022-08-12: qty 10

## 2022-08-12 MED ORDER — LIDOCAINE 2% (20 MG/ML) 5 ML SYRINGE
INTRAMUSCULAR | Status: DC | PRN
  Administered 2022-08-12: 100 mg via INTRAVENOUS

## 2022-08-12 MED ORDER — FENTANYL CITRATE (PF) 100 MCG/2ML IJ SOLN: 25.0000 ug | INTRAMUSCULAR | Status: DC | PRN

## 2022-08-12 SURGICAL SUPPLY — 15 items

## 2022-08-12 NOTE — Op Note (Signed)
[facility] Patient Name: Francisco [name] Procedure Date : 08/12/2022 MRN: [id number] Attending MD: Gerrit Heck , MD, [id number] Date of Birth: [birth year] CSN: [id number] Age: 77 Admit Type: Inpatient Procedure:                Upper GI endoscopy s/ control of bleeding,                            submucosal injection, biopsy Indications:              Acute post hemorrhagic anemia, Melena Providers:                Gerrit Heck, MD, Adah Perl RN, RN,                            Luan Moore, Technician Referring MD:              Medicines:                Monitored Anesthesia Care Complications:            No immediate complications. Estimated Blood Loss:     Estimated blood loss was minimal. Procedure:                Pre-Anesthesia Assessment:                           - Prior to the procedure, a History and Physical                            was performed, and patient medications and                            allergies were reviewed. The patient's tolerance of                            previous anesthesia was also reviewed. The risks                            and benefits of the procedure and the sedation                            options and risks were discussed with the patient.                            All questions were answered, and informed consent                            was obtained. Prior Anticoagulants: The patient has                            taken Eliquis (apixaban), last dose was 3 days                            prior to procedure. ASA Grade Assessment: III - A  patient with severe systemic disease. After                            reviewing the risks and benefits, the patient was                            deemed in satisfactory condition to undergo the                            procedure.                           After obtaining informed consent, the endoscope was                            passed under  direct vision. Throughout the                            procedure, the patient's blood pressure, pulse, and                            oxygen saturations were monitored continuously. The                            [id number]-H190 ([id number] Olympus endoscope was introduced                            through the mouth, and advanced to the third part                            of duodenum. The upper GI endoscopy was                            accomplished without difficulty. The patient                            tolerated the procedure well. Scope In: Scope Out: Findings:      The examined esophagus was normal.      A 3 cm hiatal hernia was present.      Hematin (altered blood/coffee-ground-like material) was found in the       gastric fundus and in the gastric body. Lavage of the area was performed       using tap water, resulting in clearance with good visualization.      There was an adherent clot in the gastric fundus. This was lavaged to       reveal a vascular lesion highly suspicious for a Dieulafoy lesion. There       was no surrounding ulcer. Based on the endoscopic appearance and heme in       the stomach, I elected for endoscopic hemostasis of the suspicious       lesion. Two hemostatic clips were successfully placed (MR conditional).       Clip manufacturer: [employer]. Area was successfully injected       with 2.5 mL of a 0.1 mg/mL solution of epinephrine for hemostasis with  appropriate mucosal blanching.      Localized mild inflammation characterized by erythema was found in the       gastric antrum. Biopsies were taken with a cold forceps for histology.       Estimated blood loss was minimal. Bleeding from the each of biopsy sites       ceased completely without need for endoscopic intervention.      Two small angioectasias with typical arborization were found in the       second portion of the duodenum. Coagulation for hemostasis using argon       plasma was  successful. Estimated blood loss: none. Impression:               - Normal esophagus.                           - 3 cm hiatal hernia.                           - Hematin (altered blood/coffee-ground-like                            material) in the gastric fundus and in the gastric                            body.                           - Dieulafoy lesion of stomach located in the                            gastric fundus. This was treated with hemostatic                            clips x2 and epinephrine injection.                           - Mild, non-ulcer gastritis. Biopsied.                           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 Treated with                            argon plasma coagulation (APC). Recommendation:           - Return patient to hospital ward for ongoing care.                           - Advance diet as tolerated.                           - Await pathology results.                           - Use Protonix (pantoprazole) 40 mg PO BID for 8                            weeks to promote mucosal healing, then  reduce to 40                            mg daily. Resume PPI for prophylaxis if requiring                            ongoing anticoagulation.                           - If planning to restart anticoagulation, would                            wait at least 72 hours to allow for mucosal healing                            after endoscopic intervention today.                           - Continue CBC trend while inpatient.                           - Communicated results to the inpatient Hospitalist                            team and to his spouse. Procedure Code(s):        --- Professional ---                           [phone], 97, Esophagogastroduodenoscopy, flexible,                            transoral; with control of bleeding, any method                           43239, Esophagogastroduodenoscopy, flexible,                            transoral; with  biopsy, single or multiple Diagnosis Code(s):        --- Professional ---                           K44.9, Diaphragmatic hernia without obstruction or                            gangrene                           K92.2, Gastrointestinal hemorrhage, unspecified                           K31.82, Dieulafoy lesion (hemorrhagic) of stomach                            and duodenum                           K29.70, Gastritis, unspecified, without bleeding  K31.819, Angiodysplasia of stomach and duodenum                            without bleeding                           D62, Acute posthemorrhagic anemia                           K92.1, Melena (includes Hematochezia) CPT copyright 2022 American Medical Association. All rights reserved. The codes documented in this report are preliminary and upon coder review may  be revised to meet current compliance requirements. Gerrit Heck, MD 08/12/2022 10:36:05 AM Number of Addenda: 0

## 2022-08-12 NOTE — Interval H&P Note (Signed)
History and Physical Interval Note:  Discussed his care with the primary Hospitalist this morning.  From pulmonary standpoint he is as optimized as he can be, on his baseline 4L.  Patient states his breathing is at baseline.  Otherwise H/H largely stable at 10.6/32.7.  Holding Eliquis.  Plan to proceed with upper endoscopy for diagnostic and therapeutic intent.  08/12/2022 9:35 AM  Francisco [name]  has presented today for surgery, with the diagnosis of Melena, GI bleeding.  The various methods of treatment have been discussed with the patient and family. After consideration of risks, benefits and other options for treatment, the patient has consented to  Procedure(s): ESOPHAGOGASTRODUODENOSCOPY (EGD) WITH PROPOFOL (N/A) as a surgical intervention.  The patient's history has been reviewed, patient examined, no change in status, stable for surgery.  I have reviewed the patient's chart and labs.  Questions were answered to the patient's satisfaction.     Dominic Pea Aalina Brege

## 2022-08-12 NOTE — Anesthesia Preprocedure Evaluation (Signed)
Anesthesia Evaluation  Patient identified by MRN, date of birth, ID band Patient awake    Reviewed: Allergy & Precautions, H&P , NPO status , Patient's Chart, lab work & pertinent test results  Airway Mallampati: II   Neck ROM: full    Dental   Pulmonary shortness of breath, COPD,  oxygen dependent, former smoker   breath sounds clear to auscultation       Cardiovascular hypertension, + Peripheral Vascular Disease   Rhythm:regular Rate:Normal     Neuro/Psych    GI/Hepatic hiatal hernia,,,  Endo/Other    Renal/GU      Musculoskeletal   Abdominal   Peds  Hematology  (+) Blood dyscrasia, anemia   Anesthesia Other Findings   Reproductive/Obstetrics                             Anesthesia Physical Anesthesia Plan  ASA: 3  Anesthesia Plan: MAC   Post-op Pain Management:    Induction: Intravenous  PONV Risk Score and Plan: 1 and Propofol infusion and Treatment may vary due to age or medical condition  Airway Management Planned: Nasal Cannula  Additional Equipment:   Intra-op Plan:   Post-operative Plan:   Informed Consent: I have reviewed the patients History and Physical, chart, labs and discussed the procedure including the risks, benefits and alternatives for the proposed anesthesia with the patient or authorized representative who has indicated his/her understanding and acceptance.     Dental advisory given  Plan Discussed with: CRNA, Anesthesiologist and Surgeon  Anesthesia Plan Comments:        Anesthesia Quick Evaluation

## 2022-08-12 NOTE — Progress Notes (Signed)
Patient back, from endo. No complaints voiced. VSS. Patient is a/o x4.

## 2022-08-12 NOTE — Progress Notes (Signed)
PROGRESS NOTE                                                                                                                                                                                                             Patient Demographics:    Francisco [name], is a 77 y.o. male, DOB - 05-13-1946, POE:[id number]  Outpatient Primary MD for the patient is [name], MD    LOS - 2  Admit date - 08/09/2022    Chief Complaint  Patient presents with   GI Bleeding       Brief Narrative (HPI from H&P)   77 y.o. male with medical history significant of COPD, venous stasis, PAD, anemia, hyperlipidemia, history of GI bleed with AVM, BPH, lung cancer on observation, essential tremor, hypertension, CHF, PE on Eliquis presenting with dark stools from PCP office.   Patient has had dark stools for the past 2 days.  Is on iron therapy but PCP felt he looked pale and sent him to the ED for further evaluation for possible GI bleed.  This is in the setting of history of known prior GI bleed and currently on Eliquis.  Patient additionally had a fall 2 weeks ago and has had some low back pain since that time.  Additionally is reporting some constipation.  In the ER he was found to have another L-spine compression fracture acute on chronic, of note he was due to be seen at [facility] in the next coming days, he was also found to be anemic and admitted to the hospital for pain control and anemia treatment.   Subjective:   Patient in bed, appears comfortable, denies any headache, no fever, no chest pain or pressure, no shortness of breath , no abdominal pain. No new focal weakness, improved acute on chronic low back pain.   Assessment  & Plan :   Upper GI bleed with bleeding, with acute blood loss related anemia.  Has heme positive and melanotic stools, currently on IV PPI, H&H closely being monitored, type screen done patient is agreeable  for transfusion if needed.  GI on board likely EGD later on 08/12/2022, monitoring closely.  Has previously undergone EGD by Dr. Rush Landmark.   Acute on chronic low back pain.  Has history of L-spine fractures in the past, had fall 2 weeks ago after which  looks like he has a subacute L2 compression fracture as well.  Currently pain control, PT OT, [facility] consulted who are to see the patient shortly.  Case discussed with orthopedics on-call Dr. Thereasa Solo continue medical treatment with LSO brace when out of bed, one of the orthopedic surgeons will see the patient on 08/12/2022 most likely all management is medical with outpatient follow-up.  No urgent surgical intervention needed.  Severe underlying COPD with cor pulmonale and pulmonary hypertension.  He is on 4 to 5 L of oxygen at all times, currently also has evidence of mild fluid overload in the lungs, continue home nebulizer treatments, oxygen, encouraged to sit up in chair use I-S and flutter valve for pulmonary toiletry.  Will diurese on top of his lung medications and monitor.  Acute on chronic diastolic CHF EF 40%.  Does have evidence of fluid overload continue gentle diuresis and monitor.  Has been placed on beta-blocker.  PAD.  No acute issues.  Continue statin for secondary prevention.  History of PE.  On Eliquis currently on hold due to GI bleed, this has been at least 2 years or more.  Dyslipidemia.  On statin.  History of lung cancer.  Outpatient follow-up with his pulmonologist [name] and his oncologist.        Condition - Extremely Guarded  Family Communication  :  wife on 08/10/22  Code Status :  DNR  Consults  :  GI, Ortho  PUD Prophylaxis : PPI   Procedures  :     EGD -  Echocardiogram.  1. Left ventricular ejection fraction, by estimation, is 55 to 60%. The left ventricle has normal function. The left ventricle has no regional wall motion abnormalities. Left ventricular diastolic function could not be  evaluated.  2. Right ventricular systolic function is normal. The right ventricular size is normal.  3. A small pericardial effusion is present.  4. No evidence of mitral valve regurgitation.  5. The aortic valve was not well visualized. Aortic valve regurgitation is not visualized.  6. Not well visualized.  7. The inferior vena cava is normal in size with greater than 50% respiratory variability, suggesting right atrial pressure of 3 mmHg.  CT scan abdomen pelvis.  1. Acute appearing compression fracture of L2 with mild retropulsion of fracture fragments. Please correlate clinically. 2. Left inguinal hernia containing colon has increased in size. No bowel obstruction. 3. Stable right inguinal hernia containing bladder. 4. 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 Aortic Atherosclerosis (ICD10-I70.0).  CT L-spine. - 1. Acute to subacute fracture deformity of the L2 vertebral body is new when compared with the MRI from 07/27/2022. Loss of approximately 50% of the central vertebral body height is identified. Mild retropulsion is identified along the posterior aspect of the superior endplate. 2. Chronic fracture deformity involving the L4 vertebral body is again noted with loss of approximately 70% of the central vertebral body height. 3. Age-indeterminate L3 inferior endplate compression deformity is unchanged when compared with the MRI from 07/27/22      Disposition Plan  :    Status is: Inpt  DVT Prophylaxis  :    Place and maintain sequential compression device Start: 08/10/22 0554 SCDs Start: 08/09/22 1844    Lab Results  Component Value Date   PLT 233 08/12/2022    Diet :  Diet Order             Diet NPO time specified Except for: Sips with Meds  Diet effective  midnight           Diet NPO time specified Except for: Sips with Meds  Diet effective midnight                    Inpatient Medications  Scheduled Meds:  acetaminophen  1,000 mg Oral Q6H   docusate sodium  200  mg Oral BID   fentaNYL  1 patch Transdermal Q72H   ferrous sulfate  325 mg Oral QODAY   furosemide  60 mg Oral Once   metoprolol tartrate  25 mg Oral BID   mometasone-formoterol  2 puff Inhalation BID   pantoprazole (PROTONIX) IV  40 mg Intravenous Q12H   polyethylene glycol  17 g Oral Daily   predniSONE  10 mg Oral Q breakfast   roflumilast  500 mcg Oral Daily   rosuvastatin  10 mg Oral Daily   sodium chloride flush  3 mL Intravenous Q12H   umeclidinium bromide  1 puff Inhalation Daily   Continuous Infusions: PRN Meds:.albuterol, bisacodyl, metoprolol tartrate, morphine injection, oxyCODONE  Antibiotics  :    Anti-infectives (From admission, onward)    None         Objective:   Vitals:   08/11/22 1939 08/11/22 2106 08/12/22 0000 08/12/22 0400  BP: 112/62  126/67 110/63  Pulse: 78  71 74  Resp:   10 11  Temp:   98.1 F (36.7 C) (!) 97.4 F (36.3 C)  TempSrc:   Oral Oral  SpO2:  100% 100% 100%  Weight:    89.6 kg  Height:        Wt Readings from Last 3 Encounters:  08/12/22 89.6 kg  06/06/22 82.6 kg  05/02/22 80.7 kg     Intake/Output Summary (Last 24 hours) at 08/12/2022 0739 Last data filed at 08/11/2022 1940 Gross per 24 hour  Intake 120 ml  Output --  Net 120 ml     Physical Exam  Awake Alert, No new F.N deficits, Normal affect [place].AT,PERRAL Supple Neck, No JVD,   Symmetrical Chest wall movement, Mod air movement bilaterally, few rales RRR,No Gallops, Rubs or new Murmurs,  +ve B.Sounds, Abd Soft, No tenderness,   1+ edema       Data Review:    Recent Labs  Lab 08/09/22 1330 08/10/22 0322 08/10/22 1123 08/10/22 1919 08/11/22 0531 08/11/22 2012 08/12/22 0226  WBC 10.7* 9.3 16.0*  --  9.6 14.1* 11.5*  HGB 11.0* 9.9* 11.0* 10.9* 9.7* 10.3* 10.6*  HCT 34.7* 30.9* 34.7* 33.0* 29.4* 31.8* 32.7*  PLT 307 249 318  --  222 230 233  MCV 99.4 97.5 96.9  --  96.1 97.5 98.8  MCH 31.5 31.2 30.7  --  31.7 31.6 32.0  MCHC 31.7 32.0 31.7  --  33.0  32.4 32.4  RDW 13.2 13.2 13.1  --  13.2 13.1 13.1  LYMPHSABS 0.5*  --   --   --  0.7  --  0.9  MONOABS 0.5  --   --   --  1.0  --  1.2*  EOSABS 0.0  --   --   --  0.3  --  0.2  BASOSABS 0.1  --   --   --  0.0  --  0.1    Recent Labs  Lab 08/09/22 1330 08/10/22 0322 08/10/22 1123 08/11/22 0531 08/12/22 0226  NA 139 138  --  138 134*  K 4.7 3.9  --  4.1 4.9  CL 99 100  --  102 96*  CO2 28 27  --  29 29  ANIONGAP 12 11  --  7 9  GLUCOSE 108* 80  --  95 103*  BUN 12 11  --  16 14  CREATININE 1.12 1.15  --  1.16 1.10  AST 23 19  --   --   --   ALT 18 16  --   --   --   ALKPHOS 90 84  --   --   --   BILITOT 0.7 0.7  --   --   --   ALBUMIN 3.2* 2.9*  --   --   --   INR 1.2  --   --   --   --   TSH  --   --  2.962  --   --   BNP  --   --   --  104.9* 147.0*  MG  --   --  1.9 1.9 2.3  CALCIUM 9.2 8.7*  --  8.4* 8.6*      Radiology Reports ECHOCARDIOGRAM COMPLETE  Result Date: 08/10/2022    ECHOCARDIOGRAM REPORT   Patient Name:   TRESON LAURA II Date of Exam: 08/10/2022 Medical Rec #:  [id number]         Height:       72.0 in Accession #:    [id number]        Weight:       195.1 lb Date of Birth:  02-08-1946         BSA:          2.108 m Patient Age:    39 years          BP:           151/78 mmHg Patient Gender: M                 HR:           116 bpm. Exam Location:  Inpatient Procedure: 2D Echo, Color Doppler, Cardiac Doppler and Intracardiac            Opacification Agent Indications:    CHF - Acute Diastolic  History:        Patient has prior history of Echocardiogram examinations, most                 recent 11/04/2017. CHF, PAD and COPD; Signs/Symptoms:Hypotension,                 Edema and Dyspnea. Lung cancer, pulmonary embolism.  Sonographer:    Eartha Inch Referring Phys: Graylin Shiver [facility]  Sonographer Comments: Technically difficult study due to poor echo windows. Image acquisition challenging due to patient body habitus and Image acquisition challenging due to  respiratory motion. IMPRESSIONS  1. Left ventricular ejection fraction, by estimation, is 55 to 60%. The left ventricle has normal function. The left ventricle has no regional wall motion abnormalities. Left ventricular diastolic function could not be evaluated.  2. Right ventricular systolic function is normal. The right ventricular size is normal.  3. A small pericardial effusion is present.  4. No evidence of mitral valve regurgitation.  5. The aortic valve was not well visualized. Aortic valve regurgitation is not visualized.  6. Not well visualized.  7. The inferior vena cava is normal in size with greater than 50% respiratory variability, suggesting right atrial pressure of 3 mmHg. FINDINGS  Left Ventricle: Left ventricular ejection fraction, by estimation, is 55 to 60%. The left  ventricle has normal function. The left ventricle has no regional wall motion abnormalities. Definity contrast agent was given IV to delineate the left ventricular  endocardial borders. The left ventricular internal cavity size was normal in size. There is no left ventricular hypertrophy. Left ventricular diastolic function could not be evaluated. Right Ventricle: The right ventricular size is normal. Right ventricular systolic function is normal. Left Atrium: Left atrial size was normal in size. Right Atrium: Right atrial size was normal in size. Pericardium: A small pericardial effusion is present. Mitral Valve: No evidence of mitral valve regurgitation. Tricuspid Valve: Tricuspid valve regurgitation is not demonstrated. Aortic Valve: The aortic valve was not well visualized. Aortic valve regurgitation is not visualized. Pulmonic Valve: Pulmonic valve regurgitation is not visualized. Aorta: Not well visualized. Venous: The inferior vena cava is normal in size with greater than 50% respiratory variability, suggesting right atrial pressure of 3 mmHg. IAS/Shunts: The interatrial septum was not well visualized.  LEFT VENTRICLE PLAX 2D  LVIDd:         4.00 cm LVIDs:         3.10 cm LV PW:         1.10 cm LV IVS:        0.90 cm  LEFT ATRIUM           Index LA diam:      3.50 cm 1.66 cm/m LA Vol (A4C): 25.1 ml 11.91 ml/m Phineas Inches Electronically signed by Phineas Inches Signature Date/Time: 08/10/2022/5:09:21 PM    Final    CT L-SPINE NO CHARGE  Result Date: 08/09/2022 CLINICAL DATA:  Abdominal pain.  Status post fall EXAM: CT LUMBAR SPINE WITHOUT CONTRAST TECHNIQUE: Multidetector CT imaging of the lumbar spine was performed without intravenous contrast administration. Multiplanar CT image reconstructions were also generated. RADIATION DOSE REDUCTION: This exam was performed according to the departmental dose-optimization program which includes automated exposure control, adjustment of the mA and/or kV according to patient size and/or use of iterative reconstruction technique. COMPARISON:  Abdominal MRI from 07/27/2022 and CT chest 06/21/2022. FINDINGS: Segmentation: 5 lumbar type vertebrae. Alignment: Mild curvature of the lumbar spine is convex towards the left. Vertebrae: Acute to subacute fracture deformity is identified involving the L2 vertebral body. This is new when compared with the MRI from 07/27/2022. Loss of approximately 50% of the central vertebral body height is identified. Mild retropulsion is identified along the posterior aspect of the superior endplate, image 56/4. L3 mild inferior endplate compression deformity is unchanged when compared with the MRI from 07/27/22 with loss of approximately 10% of the central vertebral body height. Chronic fracture deformity involving the L4 vertebral body is again noted with loss of approximately 70% of the central vertebral body height. Paraspinal and other soft tissues: Negative. Disc levels: Disc spaces are relatively well preserved. No significant degenerative disc disease. IMPRESSION: 1. Acute to subacute fracture deformity of the L2 vertebral body is new when compared with the MRI from  07/27/2022. Loss of approximately 50% of the central vertebral body height is identified. Mild retropulsion is identified along the posterior aspect of the superior endplate. 2. Chronic fracture deformity involving the L4 vertebral body is again noted with loss of approximately 70% of the central vertebral body height. 3. Age-indeterminate L3 inferior endplate compression deformity is unchanged when compared with the MRI from 07/27/22. Electronically Signed   By: Kerby Moors M.D.   On: 08/09/2022 18:01   CT ABDOMEN PELVIS W CONTRAST  Result Date: 08/09/2022 CLINICAL DATA:  Acute  abdominal pain EXAM: CT ABDOMEN AND PELVIS WITH CONTRAST TECHNIQUE: Multidetector CT imaging of the abdomen and pelvis was performed using the standard protocol following bolus administration of intravenous contrast. RADIATION DOSE REDUCTION: This exam was performed according to the departmental dose-optimization program which includes automated exposure control, adjustment of the mA and/or kV according to patient size and/or use of iterative reconstruction technique. CONTRAST:  59mL OMNIPAQUE IOHEXOL 350 MG/ML SOLN COMPARISON:  PET-CT 08/08/2021. MRI abdomen 07/27/2022. Lumbar spine MRI 06/06/2020. FINDINGS: Lower chest: Emphysema present Hepatobiliary: No focal liver abnormality is seen. No gallstones, gallbladder wall thickening, or biliary dilatation. Pancreas: There is a 1.7 x 1.7 cm low-attenuation/cystic area in the tail the pancreas which appears stable. No acute inflammation or ductal dilatation. Spleen: Normal in size without focal abnormality. Adrenals/Urinary Tract: Right inferior pole cyst measures 15 mm. Otherwise, the kidneys, adrenal glands and bladder are within normal limits. Stomach/Bowel: Stomach is within normal limits. Appendix is not seen. No evidence of bowel wall thickening, distention, or inflammatory changes. Vascular/Lymphatic: Aortic atherosclerosis. No enlarged abdominal or pelvic lymph nodes.  Reproductive: Prostate gland is mildly enlarged. Other: Left inguinal hernia containing colon is moderate in size and has increased from prior study. There is a small right inguinal hernia containing bladder, unchanged. There is no ascites. Musculoskeletal: Chronic compression fracture of L4 is unchanged. There is a acute appearing compression fracture of L2 with mild retropulsion of fracture fragments (30% vertebral body height loss). IMPRESSION: 1. Acute appearing compression fracture of L2 with mild retropulsion of fracture fragments. Please correlate clinically. 2. Left inguinal hernia containing colon has increased in size. No bowel obstruction. 3. Stable right inguinal hernia containing bladder. 4. 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 Aortic Atherosclerosis (ICD10-I70.0). Electronically Signed   By: Ronney Asters M.D.   On: 08/09/2022 17:56      Signature  -   Lala Lund M.D on 08/12/2022 at 7:39 AM   -  To page go to [email or web address]

## 2022-08-12 NOTE — Progress Notes (Signed)
Progress Note  Primary GI: Dr. Rush Landmark   Subjective  Chief Complaint:melena in setting of chronic Eliquis   Patient with family at bedside, wife Lilly. Provided some of the history.  Patient still remains on 4 L but this is what he was on at home.  States his breathing is good as it has been while at home, he has some wheezing, has been using flutter valve, has not been on his daily Symbicort which she hopes to restart.   Had 1 dark stool this morning but he is on oral iron.  Patient denies nausea, vomiting, abdominal pain.     Objective   Vital signs in last 24 hours: Temp:  [97.4 F (36.3 C)-98.1 F (36.7 C)] 98.1 F (36.7 C) (02/05 0743) Pulse Rate:  [71-126] 74 (02/05 0743) Resp:  [10-20] 15 (02/05 0743) BP: (102-140)/(58-77) 140/68 (02/05 0743) SpO2:  [96 %-100 %] 100 % (02/05 0400) FiO2 (%):  [36 %] 36 % (02/04 2106) Weight:  [89.6 kg] 89.6 kg (02/05 0400) Last BM Date :  (pta) Last BM recorded by nurses in past 5 days Stool Type: Type 6 (Mushy consistency with ragged edges) (08/12/2022  7:39 AM)  General:    Older white male in NAD on 4 L nasal cannula no increased work of breathing Heart: tachy  Regular rate and rhythm; no murmurs Lungs: Decreased breath sounds bilateral, mild expiratory wheese Abdomen:  Soft, nontender and nondistended. Normal bowel sounds. Extremities:  With edema and stasis dermatitis/plaques Neurologic:  Alert and oriented,  grossly normal neurologically. Psych:  Cooperative. Normal mood and affect.  Intake/Output from previous day: 02/04 0701 - 02/05 0700 In: 120 [P.O.:120] Out: -  Intake/Output this shift: No intake/output data recorded.  Studies/Results: ECHOCARDIOGRAM COMPLETE  Result Date: 08/10/2022    ECHOCARDIOGRAM REPORT   Patient Name:   SHAKEEM STERN II Date of Exam: 08/10/2022 Medical Rec #:  [id number]         Height:       72.0 in Accession #:    [id number]        Weight:       195.1 lb Date of Birth:  [birth year]         BSA:           2.108 m Patient Age:    77 years          BP:           151/78 mmHg Patient Gender: M                 HR:           116 bpm. Exam Location:  Inpatient Procedure: 2D Echo, Color Doppler, Cardiac Doppler and Intracardiac            Opacification Agent Indications:    CHF - Acute Diastolic  History:        Patient has prior history of Echocardiogram examinations, most                 recent 11/04/2017. CHF, PAD and COPD; Signs/Symptoms:Hypotension,                 Edema and Dyspnea. Lung cancer, pulmonary embolism.  Sonographer:    Eartha Inch Referring Phys: Graylin Shiver [facility]  Sonographer Comments: Technically difficult study due to poor echo windows. Image acquisition challenging due to patient body habitus and Image acquisition challenging due to respiratory motion. IMPRESSIONS  1. Left ventricular ejection fraction, by  estimation, is 55 to 60%. The left ventricle has normal function. The left ventricle has no regional wall motion abnormalities. Left ventricular diastolic function could not be evaluated.  2. Right ventricular systolic function is normal. The right ventricular size is normal.  3. A small pericardial effusion is present.  4. No evidence of mitral valve regurgitation.  5. The aortic valve was not well visualized. Aortic valve regurgitation is not visualized.  6. Not well visualized.  7. The inferior vena cava is normal in size with greater than 50% respiratory variability, suggesting right atrial pressure of 3 mmHg. FINDINGS  Left Ventricle: Left ventricular ejection fraction, by estimation, is 55 to 60%. The left ventricle has normal function. The left ventricle has no regional wall motion abnormalities. Definity contrast agent was given IV to delineate the left ventricular  endocardial borders. The left ventricular internal cavity size was normal in size. There is no left ventricular hypertrophy. Left ventricular diastolic function could not be evaluated. Right Ventricle: The right  ventricular size is normal. Right ventricular systolic function is normal. Left Atrium: Left atrial size was normal in size. Right Atrium: Right atrial size was normal in size. Pericardium: A small pericardial effusion is present. Mitral Valve: No evidence of mitral valve regurgitation. Tricuspid Valve: Tricuspid valve regurgitation is not demonstrated. Aortic Valve: The aortic valve was not well visualized. Aortic valve regurgitation is not visualized. Pulmonic Valve: Pulmonic valve regurgitation is not visualized. Aorta: Not well visualized. Venous: The inferior vena cava is normal in size with greater than 50% respiratory variability, suggesting right atrial pressure of 3 mmHg. IAS/Shunts: The interatrial septum was not well visualized.  LEFT VENTRICLE PLAX 2D LVIDd:         4.00 cm LVIDs:         3.10 cm LV PW:         1.10 cm LV IVS:        0.90 cm  LEFT ATRIUM           Index LA diam:      3.50 cm 1.66 cm/m LA Vol (A4C): 25.1 ml 11.91 ml/m Phineas Inches Electronically signed by Phineas Inches Signature Date/Time: 08/10/2022/5:09:21 PM    Final     Lab Results: Recent Labs    08/11/22 0531 08/11/22 2012 08/12/22 0226  WBC 9.6 14.1* 11.5*  HGB 9.7* 10.3* 10.6*  HCT 29.4* 31.8* 32.7*  PLT 222 230 233   BMET Recent Labs    08/10/22 0322 08/11/22 0531 08/12/22 0226  NA 138 138 134*  K 3.9 4.1 4.9  CL 100 102 96*  CO2 27 29 29   GLUCOSE 80 95 103*  BUN 11 16 14   CREATININE 1.15 1.16 1.10  CALCIUM 8.7* 8.4* 8.6*   LFT Recent Labs    08/10/22 0322  PROT 5.6*  ALBUMIN 2.9*  AST 19  ALT 16  ALKPHOS 84  BILITOT 0.7   PT/INR Recent Labs    08/09/22 1330  LABPROT 14.8  INR 1.2     Scheduled Meds:  acetaminophen  1,000 mg Oral Q6H   docusate sodium  200 mg Oral BID   fentaNYL  1 patch Transdermal Q72H   ferrous sulfate  325 mg Oral QODAY   furosemide  60 mg Oral Once   metoprolol tartrate  25 mg Oral BID   mometasone-formoterol  2 puff Inhalation BID   pantoprazole  (PROTONIX) IV  40 mg Intravenous Q12H   polyethylene glycol  17 g Oral Daily   predniSONE  10 mg Oral Q breakfast   roflumilast  500 mcg Oral Daily   rosuvastatin  10 mg Oral Daily   sodium chloride flush  3 mL Intravenous Q12H   umeclidinium bromide  1 puff Inhalation Daily   Continuous Infusions:    Impression/Plan:   Acute/subacute GI bleed with melena at home with chronic Eliquis currently on hold Drifting hemoglobin Previously duodenal AVM treated APC 2021 Currently n.p.o. Pulmonary status optimized to his outpatient pulmonary status, him and his wife understand he is high risk candidate for endoscopic evaluation due to severe COPD and underlying pulmonary hypertension but they would like to proceed with EGD today.  Discussed indications risk benefits and agreeable to proceed. Will still need to sort out prior to discharge or other needs to go back to Eliquis or not.  #3 history of PE for which she is on Eliquis-[place] need to continue long-term Eliquis #4 lung cancer currently just on observation #5 peripheral arterial disease 6.  Congestive heart failure 7.  Chronic EtOH use 8.  History of adenomatous colon polyps last colonoscopy was greater than 10 years ago #9-acute lower back pain with acute L2 compression fracture  Principal Problem:   GI bleed Active Problems:   Hyperlipidemia   BPH associated with nocturia   Chronic respiratory failure with hypoxia (HCC)   Essential hypertension   Essential tremor   Chronic pulmonary embolism ([place])   Stage 4 very severe COPD by GOLD classification (HCC)   PAD (peripheral artery disease) (HCC)   Malignant neoplasm of lower lobe of right lung (HCC)   Iron deficiency anemia due to chronic blood loss   Chronic systolic congestive heart failure ([place])   Melena    LOS: 2 days   Vladimir Crofts  08/12/2022, 8:50 AM

## 2022-08-12 NOTE — Consult Note (Signed)
History: 77 y.o. male with medical history significant of COPD, venous stasis, PAD, anemia, hyperlipidemia, history of GI bleed with AVM, BPH, lung cancer on observation, essential tremor, hypertension, CHF, PE on Eliquis presenting with dark stools from PCP office.   Patient has had dark stools for the past 2 days.  Is on iron therapy but PCP felt he looked pale and sent him to the ED for further evaluation for possible GI bleed.  This is in the setting of history of known prior GI bleed and currently on Eliquis.  Patient additionally had a fall 2 weeks ago and has had some low back pain since that time.  Additionally is reporting some constipation.   In the ER he was found to have another L-spine compression fracture acute on chronic, of note he was due to be seen at [facility] in the next coming days, he was also found to be anemic and admitted to the hospital for pain control and anemia treatment.  As result the patient was admitted for further management of his GI bleed.  Past Medical History:  Diagnosis Date   Chronic back pain    "mid and lower" (04/07/2018)   Chronic rhinitis    -Sinus Ct 08/01/2009 >> Bilateral maxillary sinusitis with some mucosal thickeningin the sphenoid and frontal sinuses as well with air fluid levels present -chronic rhinitis flyer Aug 04, 2009   Compressed spine fracture [facility])    COPD (chronic obstructive pulmonary disease) (HCC)    PFT's rec Jul 17, 2009   Dyspnea    Emphysema lung ([place])    On home oxygen therapy    "3L; 24/7" (04/07/2018)   Onychomycosis    Dr. Judi Cong   Orthostatic hypotension    "since 10/2017" (04/07/2018)   PAD (peripheral artery disease) ([place])    Pneumonia    "twice in 1 year" (04/07/2018)   Pulmonary embolism ([place]) 04/07/2018   Skin cancer    "lips, face, ears, arms" (04/07/2018)   Small cell lung cancer, right ([place]) 2023   Dr. Lisbeth Renshaw.  rad   Vertigo    "since ~ 02/2018" (04/07/2018)    Allergies  Allergen Reactions    Tape Other (See Comments)    Skin tears easily.   Augmentin [Amoxicillin-Pot Clavulanate] Other (See Comments)    Thrush Sore throat Hoarse voice   Cipro [Ciprofloxacin Hcl] Nausea And Vomiting    Weakness Fatigue    Gadavist [Gadobutrol] Other (See Comments)    Unknown reaction   Levaquin [Levofloxacin] Other (See Comments)    Hallucinations    Lipitor [Atorvastatin] Rash    No current facility-administered medications on file prior to encounter.   Current Outpatient Medications on File Prior to Encounter  Medication Sig Dispense Refill   acetaminophen (TYLENOL) 500 MG tablet Take 500 mg by mouth every 4 (four) hours as needed for moderate pain, fever, headache or mild pain.     albuterol (VENTOLIN HFA) 108 (90 Base) MCG/ACT inhaler USE 2 INHALATIONS BY MOUTH  EVERY 6 HOURS AS NEEDED FOR WHEEZING (Patient taking differently: Inhale 2 puffs into the lungs every 6 (six) hours as needed for wheezing.) 72 g 3   azithromycin (ZITHROMAX) 250 MG tablet Take 1 tablet (250 mg total) by mouth 3 (three) times a week. (Patient taking differently: Take 250 mg by mouth every Monday, Wednesday, and Friday.) 39 each 3   Calcium Carbonate-Vitamin D (CALCIUM 600+D PO) Take 2 tablets by mouth daily.     ELIQUIS 2.5  MG TABS tablet TAKE 1 TABLET BY MOUTH TWICE  DAILY (Patient taking differently: Take 2.5 mg by mouth 2 (two) times daily.) 180 tablet 3   Ferrous Sulfate (IRON) 325 (65 Fe) MG TABS Take 325 mg by mouth every other day.     fluticasone (FLONASE) 50 MCG/ACT nasal spray USE 2 SPRAYS IN BOTH  NOSTRILS DAILY (Patient taking differently: Place 2 sprays into both nostrils daily.) 48 g 3   furosemide (LASIX) 20 MG tablet TAKE 1 TABLET BY MOUTH  DAILY AS NEEDED FOR FLUID  OR EDEMA (Patient taking differently: Take 20 mg by mouth daily.) 90 tablet 3   Multiple Vitamin (MULTIVITAMIN WITH MINERALS) TABS tablet Take 1 tablet by mouth daily.     OXYGEN Inhale 4-5 L into the lungs continuous.       pantoprazole (PROTONIX) 20 MG tablet TAKE 1 TABLET BY MOUTH  DAILY 90 tablet 3   Polyethyl Glycol-Propyl Glycol (SYSTANE) 0.4-0.3 % SOLN Place 1 drop into both eyes every other day.     predniSONE (DELTASONE) 10 MG tablet Take 1 tablet (10 mg total) by mouth daily with breakfast. 90 tablet 3   roflumilast (DALIRESP) 500 MCG TABS tablet TAKE 1 TABLET BY MOUTH  DAILY (Patient taking differently: Take 500 mcg by mouth daily.) 90 tablet 3   rosuvastatin (CRESTOR) 10 MG tablet TAKE 1 TABLET BY MOUTH DAILY (Patient taking differently: Take 10 mg by mouth daily.) 90 tablet 3   SPIRIVA RESPIMAT 2.5 MCG/ACT AERS USE 2 INHALATIONS BY MOUTH  DAILY (Patient taking differently: Inhale 2 each into the lungs daily.) 12 g 3   SYMBICORT 160-4.5 MCG/ACT inhaler USE 2 INHALATIONS BY MOUTH  TWICE DAILY (Patient taking differently: Inhale 2 puffs into the lungs 2 (two) times daily.) 30.6 g 3   doxycycline (VIBRA-TABS) 100 MG tablet Take 1 tablet (100 mg total) by mouth 2 (two) times daily. (Patient not taking: Reported on 08/09/2022) 14 tablet 0   folic acid (FOLVITE) 1 MG tablet Take 1 tablet (1 mg total) by mouth daily. (Patient not taking: Reported on 08/09/2022)     oxycodone (OXY-IR) 5 MG capsule Take 1 capsule (5 mg total) by mouth every 6 (six) hours as needed (arm pain after fall- particularly with dressing changes due to sticking issue). (Patient not taking: Reported on 08/09/2022) 20 capsule 0    Physical Exam: Vitals:   08/12/22 1232 08/12/22 1400  BP:    Pulse:    Resp:    Temp:    SpO2: 96% 95%   Body mass index is 26.79 kg/m. Alert and oriented x 3. No shortness of breath or chest pain. Abdomen is soft and nontender.  No rebound tenderness.  He denies any incontinence of bowel or bladder. No hip, knee, ankle pain with isolated joint range of motion.  2+ dorsalis pedis/posterior tibialis pulses in the lower extremity.  Compartments are soft and nontender. 5/5 motor strength in the EHL/tibialis  anterior/gastrocnemius bilaterally.  Negative straight leg raise test, no clonus, negative Babinski test. Moderate back pain with palpation.  Patient is status post endoscopy and so we were unable to ambulate him.  Image: ECHOCARDIOGRAM COMPLETE  Result Date: 08/10/2022    ECHOCARDIOGRAM REPORT   Patient Name:   Francisco [name] Date of Exam: 08/10/2022 Medical Rec #:  [id number]         Height:       72.0 in Accession #:    [id number]        Weight:  195.1 lb Date of Birth:  [birth year]         BSA:          2.108 m Patient Age:    6 years          BP:           151/78 mmHg Patient Gender: M                 HR:           116 bpm. Exam Location:  Inpatient Procedure: 2D Echo, Color Doppler, Cardiac Doppler and Intracardiac            Opacification Agent Indications:    CHF - Acute Diastolic  History:        Patient has prior history of Echocardiogram examinations, most                 recent 11/04/2017. CHF, PAD and COPD; Signs/Symptoms:Hypotension,                 Edema and Dyspnea. Lung cancer, pulmonary embolism.  Sonographer:    Eartha Inch Referring Phys: Graylin Shiver [facility]  Sonographer Comments: Technically difficult study due to poor echo windows. Image acquisition challenging due to patient body habitus and Image acquisition challenging due to respiratory motion. IMPRESSIONS  1. Left ventricular ejection fraction, by estimation, is 55 to 60%. The left ventricle has normal function. The left ventricle has no regional wall motion abnormalities. Left ventricular diastolic function could not be evaluated.  2. Right ventricular systolic function is normal. The right ventricular size is normal.  3. A small pericardial effusion is present.  4. No evidence of mitral valve regurgitation.  5. The aortic valve was not well visualized. Aortic valve regurgitation is not visualized.  6. Not well visualized.  7. The inferior vena cava is normal in size with greater than 50% respiratory variability, suggesting  right atrial pressure of 3 mmHg. FINDINGS  Left Ventricle: Left ventricular ejection fraction, by estimation, is 55 to 60%. The left ventricle has normal function. The left ventricle has no regional wall motion abnormalities. Definity contrast agent was given IV to delineate the left ventricular  endocardial borders. The left ventricular internal cavity size was normal in size. There is no left ventricular hypertrophy. Left ventricular diastolic function could not be evaluated. Right Ventricle: The right ventricular size is normal. Right ventricular systolic function is normal. Left Atrium: Left atrial size was normal in size. Right Atrium: Right atrial size was normal in size. Pericardium: A small pericardial effusion is present. Mitral Valve: No evidence of mitral valve regurgitation. Tricuspid Valve: Tricuspid valve regurgitation is not demonstrated. Aortic Valve: The aortic valve was not well visualized. Aortic valve regurgitation is not visualized. Pulmonic Valve: Pulmonic valve regurgitation is not visualized. Aorta: Not well visualized. Venous: The inferior vena cava is normal in size with greater than 50% respiratory variability, suggesting right atrial pressure of 3 mmHg. IAS/Shunts: The interatrial septum was not well visualized.  LEFT VENTRICLE PLAX 2D LVIDd:         4.00 cm LVIDs:         3.10 cm LV PW:         1.10 cm LV IVS:        0.90 cm  LEFT ATRIUM           Index LA diam:      3.50 cm 1.66 cm/m LA Vol (A4C): 25.1 ml 11.91 ml/m Phineas Inches Electronically signed  by Phineas Inches Signature Date/Time: 08/10/2022/5:09:21 PM    Final    CT L-SPINE NO CHARGE  Result Date: 08/09/2022 CLINICAL DATA:  Abdominal pain.  Status post fall EXAM: CT LUMBAR SPINE WITHOUT CONTRAST TECHNIQUE: Multidetector CT imaging of the lumbar spine was performed without intravenous contrast administration. Multiplanar CT image reconstructions were also generated. RADIATION DOSE REDUCTION: This exam was performed according to  the departmental dose-optimization program which includes automated exposure control, adjustment of the mA and/or kV according to patient size and/or use of iterative reconstruction technique. COMPARISON:  Abdominal MRI from 07/27/2022 and CT chest 06/21/2022. FINDINGS: Segmentation: 5 lumbar type vertebrae. Alignment: Mild curvature of the lumbar spine is convex towards the left. Vertebrae: Acute to subacute fracture deformity is identified involving the L2 vertebral body. This is new when compared with the MRI from 07/27/2022. Loss of approximately 50% of the central vertebral body height is identified. Mild retropulsion is identified along the posterior aspect of the superior endplate, image 56/4. L3 mild inferior endplate compression deformity is unchanged when compared with the MRI from 07/27/22 with loss of approximately 10% of the central vertebral body height. Chronic fracture deformity involving the L4 vertebral body is again noted with loss of approximately 70% of the central vertebral body height. Paraspinal and other soft tissues: Negative. Disc levels: Disc spaces are relatively well preserved. No significant degenerative disc disease. IMPRESSION: 1. Acute to subacute fracture deformity of the L2 vertebral body is new when compared with the MRI from 07/27/2022. Loss of approximately 50% of the central vertebral body height is identified. Mild retropulsion is identified along the posterior aspect of the superior endplate. 2. Chronic fracture deformity involving the L4 vertebral body is again noted with loss of approximately 70% of the central vertebral body height. 3. Age-indeterminate L3 inferior endplate compression deformity is unchanged when compared with the MRI from 07/27/22. Electronically Signed   By: Kerby Moors M.D.   On: 08/09/2022 18:01   CT ABDOMEN PELVIS W CONTRAST  Result Date: 08/09/2022 CLINICAL DATA:  Acute abdominal pain EXAM: CT ABDOMEN AND PELVIS WITH CONTRAST TECHNIQUE:  Multidetector CT imaging of the abdomen and pelvis was performed using the standard protocol following bolus administration of intravenous contrast. RADIATION DOSE REDUCTION: This exam was performed according to the departmental dose-optimization program which includes automated exposure control, adjustment of the mA and/or kV according to patient size and/or use of iterative reconstruction technique. CONTRAST:  35mL OMNIPAQUE IOHEXOL 350 MG/ML SOLN COMPARISON:  PET-CT 08/08/2021. MRI abdomen 07/27/2022. Lumbar spine MRI 06/06/2020. FINDINGS: Lower chest: Emphysema present Hepatobiliary: No focal liver abnormality is seen. No gallstones, gallbladder wall thickening, or biliary dilatation. Pancreas: There is a 1.7 x 1.7 cm low-attenuation/cystic area in the tail the pancreas which appears stable. No acute inflammation or ductal dilatation. Spleen: Normal in size without focal abnormality. Adrenals/Urinary Tract: Right inferior pole cyst measures 15 mm. Otherwise, the kidneys, adrenal glands and bladder are within normal limits. Stomach/Bowel: Stomach is within normal limits. Appendix is not seen. No evidence of bowel wall thickening, distention, or inflammatory changes. Vascular/Lymphatic: Aortic atherosclerosis. No enlarged abdominal or pelvic lymph nodes. Reproductive: Prostate gland is mildly enlarged. Other: Left inguinal hernia containing colon is moderate in size and has increased from prior study. There is a small right inguinal hernia containing bladder, unchanged. There is no ascites. Musculoskeletal: Chronic compression fracture of L4 is unchanged. There is a acute appearing compression fracture of L2 with mild retropulsion of fracture fragments (30% vertebral body height loss). IMPRESSION:  1. Acute appearing compression fracture of L2 with mild retropulsion of fracture fragments. Please correlate clinically. 2. Left inguinal hernia containing colon has increased in size. No bowel obstruction. 3. Stable  right inguinal hernia containing bladder. 4. 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 Aortic Atherosclerosis (ICD10-I70.0). Electronically Signed   By: Ronney Asters M.D.   On: 08/09/2022 17:56   MR Abdomen W Wo Contrast  Result Date: 07/28/2022 CLINICAL DATA:  Follow-up pancreatic cysts. EXAM: MRI ABDOMEN WITHOUT AND WITH CONTRAST TECHNIQUE: Multiplanar multisequence MR imaging of the abdomen was performed both before and after the administration of intravenous contrast. CONTRAST:  68mL GADAVIST GADOBUTROL 1 MMOL/ML IV SOLN COMPARISON:  CT abdomen pelvis December 11, 2018, PET-CT August 08, 2021 and chest CT June 21, 2022. FINDINGS: Study is degraded by artifact related to motion and patient habitus, this lower sensitivity and specificity. Lower chest: No acute abnormality. Hepatobiliary: No suspicious hepatic lesion. Gallbladder is unremarkable. No biliary ductal dilation Pancreas: Large cystic lesion in the pancreatic tail measures 2.4 x 1.4 cm on image 22/3 without suspicious postcontrast enhancement or nodularity. Additional cystic lesions in the pancreatic tail body and head measure up to 9 mm on image 22/3 none of which demonstrate suspicious postcontrast enhancement or nodularity. No pancreatic ductal dilation. No evidence of acute inflammation. Spleen:  No splenomegaly. Adrenals/Urinary Tract: Well-circumscribed lobular T2 hyperintense right lower pole renal lesion measures 2.1 x 1.6 cm on image 24/3 the lesion demonstrates greater than 4 thin internal enhancing septations without nodularity or mural/septal thickening, compatible with a Bosniak classification 70f renal cyst. Bilateral subcentimeter fluid signal renal lesions are compatible with cysts and considered benign requiring no independent imaging follow-up. Stomach/Bowel: Visualized portions within the abdomen are unremarkable. Vascular/Lymphatic: Aortic atherosclerosis. Smooth IVC contours. The portal, splenic and superior mesenteric veins  are patent. No pathologically enlarged abdominal lymph nodes. Other:  No significant abdominal free fluid. Musculoskeletal: Chronic L4 compression deformity. Chronic irregularity of the L3 inferior endplate. IMPRESSION: Examination is degraded by artifact related to patient motion and body habitus which lower sensitivity and specificity. Within this context: 1. Cystic 2.4 cm lesion in the pancreatic tail and additional smaller cystic pancreatic lesions measure up to 9 mm common no suspicious MRI features. Given the size of the 2.4 cm cystic lesion consider further evaluation with EUS/FNA versus follow-up MRCP with and without contrast in 6 months. 2. Bosniak classification 78F right lower pole renal cyst measuring 2.1 cm. The large majority of Bosniak IIF masses are benign. When malignant, nearly all are indolent. Generally, Bosniak IIF masses are followed by imaging at 6 months and 12 months (MRI preferred over CT), then annually for a total of 5 years to assess for morphologic change. (Reference: Bosniak Classification of Cystic Renal Masses, Version 2019. Radiology 2019; [phone]): [phone].) Electronically Signed   By: Dahlia Bailiff M.D.   On: 07/28/2022 09:42   MR Brain W Wo Contrast  Result Date: 07/26/2022 CLINICAL DATA:  Small cell lung cancer monitoring EXAM: MRI HEAD WITHOUT AND WITH CONTRAST TECHNIQUE: Multiplanar, multiecho pulse sequences of the brain and surrounding structures were obtained without and with intravenous contrast. CONTRAST:  8.40mL GADAVIST GADOBUTROL 1 MMOL/ML IV SOLN COMPARISON:  01/19/2022 FINDINGS: Brain: No enhancement or swelling to suggest metastatic disease. No incidental infarct, hemorrhage, hydrocephalus, or collection. Vascular: Normal flow voids. Skull and upper cervical spine: Negative for marrow lesion Sinuses/Orbits: No significant finding.  Negative for mass. IMPRESSION: Negative for metastatic disease to the brain. Electronically Signed   By:  Jorje Guild M.D.   On:  07/26/2022 07:25    A/P: Francisco [name] is a very pleasant 77 year old gentleman who has been seen by my partner Dr. [name] for back pain.  He has been diagnosed with a compression fracture and unfortunately developed a GI bleed that necessitated admission to the hospital.  Because of intractable pain I was asked to evaluate the patient for possible kyphoplasty.  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  I requested an updated MRI to determine if there were any acute findings.  However the patient states that he has been placed on a new pain management regimen and it has been very effective in reducing his pain.  He is currently status post an upper GI endoscopy for his GI bleed and he is doing well.  He has no focal neurological deficits and his back pain is tolerable at this present time.  At this point since he is not having intractable pain I recommend that he be discharged on his current pain regimen.  He will follow-up with Dr. [name] next week for reevaluation.  If his pain intensifies spite oral medications then I would recommend considering a kyphoplasty to help manage his pain.  At this point I would recommend that he the patient start physical therapy if he is cleared from medical standpoint.  I do not see the need for a brace since he has a stable osteoporotic compression fracture.  At this point, we will sign off.  If there is any other issues or questions please not hesitate to contact me.

## 2022-08-12 NOTE — Transfer of Care (Addendum)
Immediate Anesthesia Transfer of Care Note  Patient: Francisco [name]  Procedure(s) Performed: ESOPHAGOGASTRODUODENOSCOPY (EGD) WITH PROPOFOL HOT HEMOSTASIS (ARGON PLASMA COAGULATION/BICAP) HEMOSTASIS CLIP PLACEMENT SUBMUCOSAL INJECTION  Patient Location: PACU  Anesthesia Type:MAC  Level of Consciousness: awake, alert , and oriented  Airway & Oxygen Therapy: Patient Spontanous Breathing and Patient connected to nasal cannula oxygen  Post-op Assessment: Report given to RN and Post -op Vital signs reviewed and stable  Post vital signs: Reviewed and stable  Last Vitals:  Vitals Value Taken Time  BP 119/92 08/12/22 1026  Temp    Pulse 94 08/12/22 1027  Resp 16 08/12/22 1027  SpO2 94 % 08/12/22 1027  Vitals shown include unvalidated device data.  Last Pain:  Vitals:   08/12/22 0900  TempSrc: Temporal  PainSc: 0-No pain      Patients Stated Pain Goal: 0 (67/28/97 [id number])  Complications: No notable events documented.

## 2022-08-12 NOTE — Final Progress Note (Signed)
Patient remains off the floor in endo.

## 2022-08-12 NOTE — H&P (View-Only) (Signed)
Progress Note  Primary GI: Dr. Rush Landmark   Subjective  Chief Complaint:melena in setting of chronic Eliquis   Patient with family at bedside, wife Lilly. Provided some of the history.  Patient still remains on 4 L but this is what he was on at home.  States his breathing is good as it has been while at home, he has some wheezing, has been using flutter valve, has not been on his daily Symbicort which she hopes to restart.   Had 1 dark stool this morning but he is on oral iron.  Patient denies nausea, vomiting, abdominal pain.     Objective   Vital signs in last 24 hours: Temp:  [97.4 F (36.3 C)-98.1 F (36.7 C)] 98.1 F (36.7 C) (02/05 0743) Pulse Rate:  [71-126] 74 (02/05 0743) Resp:  [10-20] 15 (02/05 0743) BP: (102-140)/(58-77) 140/68 (02/05 0743) SpO2:  [96 %-100 %] 100 % (02/05 0400) FiO2 (%):  [36 %] 36 % (02/04 2106) Weight:  [89.6 kg] 89.6 kg (02/05 0400) Last BM Date :  (pta) Last BM recorded by nurses in past 5 days Stool Type: Type 6 (Mushy consistency with ragged edges) (08/12/2022  7:39 AM)  General:    Older white male in NAD on 4 L nasal cannula no increased work of breathing Heart: tachy  Regular rate and rhythm; no murmurs Lungs: Decreased breath sounds bilateral, mild expiratory wheese Abdomen:  Soft, nontender and nondistended. Normal bowel sounds. Extremities:  With edema and stasis dermatitis/plaques Neurologic:  Alert and oriented,  grossly normal neurologically. Psych:  Cooperative. Normal mood and affect.  Intake/Output from previous day: 02/04 0701 - 02/05 0700 In: 120 [P.O.:120] Out: -  Intake/Output this shift: No intake/output data recorded.  Studies/Results: ECHOCARDIOGRAM COMPLETE  Result Date: 08/10/2022    ECHOCARDIOGRAM REPORT   Patient Name:   ASHTIAN VILLACIS II Date of Exam: 08/10/2022 Medical Rec #:  [id number]         Height:       72.0 in Accession #:    [id number]        Weight:       195.1 lb Date of Birth:  [birth year]         BSA:           2.108 m Patient Age:    45 years          BP:           151/78 mmHg Patient Gender: M                 HR:           116 bpm. Exam Location:  Inpatient Procedure: 2D Echo, Color Doppler, Cardiac Doppler and Intracardiac            Opacification Agent Indications:    CHF - Acute Diastolic  History:        Patient has prior history of Echocardiogram examinations, most                 recent 11/04/2017. CHF, PAD and COPD; Signs/Symptoms:Hypotension,                 Edema and Dyspnea. Lung cancer, pulmonary embolism.  Sonographer:    Eartha Inch Referring Phys: [facility]  Sonographer Comments: Technically difficult study due to poor echo windows. Image acquisition challenging due to patient body habitus and Image acquisition challenging due to respiratory motion. IMPRESSIONS  1. Left ventricular ejection fraction, by  estimation, is 55 to 60%. The left ventricle has normal function. The left ventricle has no regional wall motion abnormalities. Left ventricular diastolic function could not be evaluated.  2. Right ventricular systolic function is normal. The right ventricular size is normal.  3. A small pericardial effusion is present.  4. No evidence of mitral valve regurgitation.  5. The aortic valve was not well visualized. Aortic valve regurgitation is not visualized.  6. Not well visualized.  7. The inferior vena cava is normal in size with greater than 50% respiratory variability, suggesting right atrial pressure of 3 mmHg. FINDINGS  Left Ventricle: Left ventricular ejection fraction, by estimation, is 55 to 60%. The left ventricle has normal function. The left ventricle has no regional wall motion abnormalities. Definity contrast agent was given IV to delineate the left ventricular  endocardial borders. The left ventricular internal cavity size was normal in size. There is no left ventricular hypertrophy. Left ventricular diastolic function could not be evaluated. Right Ventricle: The right  ventricular size is normal. Right ventricular systolic function is normal. Left Atrium: Left atrial size was normal in size. Right Atrium: Right atrial size was normal in size. Pericardium: A small pericardial effusion is present. Mitral Valve: No evidence of mitral valve regurgitation. Tricuspid Valve: Tricuspid valve regurgitation is not demonstrated. Aortic Valve: The aortic valve was not well visualized. Aortic valve regurgitation is not visualized. Pulmonic Valve: Pulmonic valve regurgitation is not visualized. Aorta: Not well visualized. Venous: The inferior vena cava is normal in size with greater than 50% respiratory variability, suggesting right atrial pressure of 3 mmHg. IAS/Shunts: The interatrial septum was not well visualized.  LEFT VENTRICLE PLAX 2D LVIDd:         4.00 cm LVIDs:         3.10 cm LV PW:         1.10 cm LV IVS:        0.90 cm  LEFT ATRIUM           Index LA diam:      3.50 cm 1.66 cm/m LA Vol (A4C): 25.1 ml 11.91 ml/m Phineas Inches Electronically signed by Phineas Inches Signature Date/Time: 08/10/2022/5:09:21 PM    Final     Lab Results: Recent Labs    08/11/22 0531 08/11/22 2012 08/12/22 0226  WBC 9.6 14.1* 11.5*  HGB 9.7* 10.3* 10.6*  HCT 29.4* 31.8* 32.7*  PLT 222 230 233   BMET Recent Labs    08/10/22 0322 08/11/22 0531 08/12/22 0226  NA 138 138 134*  K 3.9 4.1 4.9  CL 100 102 96*  CO2 27 29 29   GLUCOSE 80 95 103*  BUN 11 16 14   CREATININE 1.15 1.16 1.10  CALCIUM 8.7* 8.4* 8.6*   LFT Recent Labs    08/10/22 0322  PROT 5.6*  ALBUMIN 2.9*  AST 19  ALT 16  ALKPHOS 84  BILITOT 0.7   PT/INR Recent Labs    08/09/22 1330  LABPROT 14.8  INR 1.2     Scheduled Meds:  acetaminophen  1,000 mg Oral Q6H   docusate sodium  200 mg Oral BID   fentaNYL  1 patch Transdermal Q72H   ferrous sulfate  325 mg Oral QODAY   furosemide  60 mg Oral Once   metoprolol tartrate  25 mg Oral BID   mometasone-formoterol  2 puff Inhalation BID   pantoprazole  (PROTONIX) IV  40 mg Intravenous Q12H   polyethylene glycol  17 g Oral Daily   predniSONE  10 mg Oral Q breakfast   roflumilast  500 mcg Oral Daily   rosuvastatin  10 mg Oral Daily   sodium chloride flush  3 mL Intravenous Q12H   umeclidinium bromide  1 puff Inhalation Daily   Continuous Infusions:    Impression/Plan:   Acute/subacute GI bleed with melena at home with chronic Eliquis currently on hold Drifting hemoglobin Previously duodenal AVM treated APC 2021 Currently n.p.o. Pulmonary status optimized to his outpatient pulmonary status, him and his wife understand he is high risk candidate for endoscopic evaluation due to severe COPD and underlying pulmonary hypertension but they would like to proceed with EGD today.  Discussed indications risk benefits and agreeable to proceed. Will still need to sort out prior to discharge or other needs to go back to Eliquis or not.  #3 history of PE for which she is on Eliquis-[place] need to continue long-term Eliquis #4 lung cancer currently just on observation #5 peripheral arterial disease 6.  Congestive heart failure 7.  Chronic EtOH use 8.  History of adenomatous colon polyps last colonoscopy was greater than 10 years ago #9-acute lower back pain with acute L2 compression fracture  Principal Problem:   GI bleed Active Problems:   Hyperlipidemia   BPH associated with nocturia   Chronic respiratory failure with hypoxia (HCC)   Essential hypertension   Essential tremor   Chronic pulmonary embolism ([place])   Stage 4 very severe COPD by GOLD classification (HCC)   PAD (peripheral artery disease) (HCC)   Malignant neoplasm of lower lobe of right lung (HCC)   Iron deficiency anemia due to chronic blood loss   Chronic systolic congestive heart failure ([place])   Melena    LOS: 2 days   Vladimir Crofts  08/12/2022, 8:50 AM

## 2022-08-13 DIAGNOSIS — M545 Low back pain, unspecified: Secondary | ICD-10-CM | POA: Diagnosis not present

## 2022-08-13 DIAGNOSIS — K921 Melena: Secondary | ICD-10-CM | POA: Diagnosis not present

## 2022-08-13 DIAGNOSIS — K922 Gastrointestinal hemorrhage, unspecified: Secondary | ICD-10-CM | POA: Diagnosis not present

## 2022-08-13 LAB — CBC WITH DIFFERENTIAL/PLATELET
Abs Immature Granulocytes: 0.05 10*3/uL (ref 0.00–0.07)
Basophils Absolute: 0.1 10*3/uL (ref 0.0–0.1)
Basophils Relative: 1 %
Eosinophils Absolute: 0.4 10*3/uL (ref 0.0–0.5)
Eosinophils Relative: 4 %
HCT: 32.1 % — ABNORMAL LOW (ref 39.0–52.0)
Hemoglobin: 10.2 g/dL — ABNORMAL LOW (ref 13.0–17.0)
Immature Granulocytes: 1 %
Lymphocytes Relative: 10 %
Lymphs Abs: 1 10*3/uL (ref 0.7–4.0)
MCH: 31.4 pg (ref 26.0–34.0)
MCHC: 31.8 g/dL (ref 30.0–36.0)
MCV: 98.8 fL (ref 80.0–100.0)
Monocytes Absolute: 1 10*3/uL (ref 0.1–1.0)
Monocytes Relative: 10 %
Neutro Abs: 7 10*3/uL (ref 1.7–7.7)
Neutrophils Relative %: 74 %
Platelets: 250 10*3/uL (ref 150–400)
RBC: 3.25 MIL/uL — ABNORMAL LOW (ref 4.22–5.81)
RDW: 13.2 % (ref 11.5–15.5)
WBC: 9.4 10*3/uL (ref 4.0–10.5)
nRBC: 0 % (ref 0.0–0.2)

## 2022-08-13 LAB — BASIC METABOLIC PANEL
Anion gap: 9 (ref 5–15)
BUN: 16 mg/dL (ref 8–23)
CO2: 31 mmol/L (ref 22–32)
Calcium: 8.6 mg/dL — ABNORMAL LOW (ref 8.9–10.3)
Chloride: 96 mmol/L — ABNORMAL LOW (ref 98–111)
Creatinine, Ser: 1.15 mg/dL (ref 0.61–1.24)
GFR, Estimated: >60 mL/min (ref >60)
Glucose, Bld: 98 mg/dL (ref 70–99)
Potassium: 4.8 mmol/L (ref 3.5–5.1)
Sodium: 136 mmol/L (ref 135–145)

## 2022-08-13 LAB — BRAIN NATRIURETIC PEPTIDE: B Natriuretic Peptide: 144.5 pg/mL — ABNORMAL HIGH (ref 0.0–100.0)

## 2022-08-13 LAB — SURGICAL PATHOLOGY

## 2022-08-13 LAB — MAGNESIUM: Magnesium: 2.1 mg/dL (ref 1.7–2.4)

## 2022-08-13 MED ORDER — FUROSEMIDE 40 MG PO TABS: 60.0000 mg | ORAL_TABLET | Freq: Once | ORAL | Status: DC

## 2022-08-13 MED ORDER — MIDODRINE HCL 5 MG PO TABS
5.0000 mg | ORAL_TABLET | Freq: Two times a day (BID) | ORAL | Status: AC
  Administered 2022-08-13 (×2): 5 mg via ORAL
  Filled 2022-08-13 (×2): qty 1

## 2022-08-13 MED ORDER — FUROSEMIDE 40 MG PO TABS
80.0000 mg | ORAL_TABLET | Freq: Once | ORAL | Status: AC
  Administered 2022-08-13: 80 mg via ORAL
  Filled 2022-08-13: qty 2

## 2022-08-13 MED ORDER — METOPROLOL TARTRATE 25 MG PO TABS
25.0000 mg | ORAL_TABLET | Freq: Two times a day (BID) | ORAL | Status: DC
  Administered 2022-08-14 – 2022-08-17 (×7): 25 mg via ORAL
  Filled 2022-08-13 (×7): qty 1

## 2022-08-13 NOTE — NC FL2 (Signed)
[place] LEVEL OF CARE FORM     IDENTIFICATION  Patient Name: Francisco [name] Birthdate: 06/08/1946 Sex: male Admission Date (Current Location): 08/09/2022  [facility] and [place] Number:  Herbalist and Address:  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   Gastritis and gastroduodenitis 08/12/2022   AVM (arteriovenous malformation) of duodenum, acquired 08/12/2022   ABLA (acute blood loss anemia) 08/12/2022   Melena 08/11/2022   GI bleed 70/26/3785   Chronic systolic congestive heart failure ([place]) 12/10/2021   AVM (arteriovenous malformation) of small bowel, acquired 11/21/2020   Iron deficiency anemia due to chronic blood loss 08/23/2020   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   History of upper GI bleeding 04/30/2020   Hiatal hernia 04/30/2020   Chronic anticoagulation 04/30/2020   Anemia 04/30/2020   Malignant neoplasm of lower lobe of right lung ([place])    Acute GI bleeding 02/18/2020   PAD (peripheral artery disease) ([place]) 12/11/2018   Pre-operative respiratory examination 12/10/2018   Stage 4 very severe COPD by GOLD classification ([place]) 11/27/2018   DNR (do not resuscitate) 88/50/2774   Diastolic dysfunction 12/87/8676   Chronic pulmonary embolism ([place]) 04/07/2018   Essential tremor 03/31/2018   BPPV (benign paroxysmal positional vertigo), right 02/10/2018   Osteoporosis 11/19/2017   Thoracic compression fracture ([place]) 11/02/2017   Essential hypertension  11/02/2017   Venous stasis dermatitis of both lower extremities 11/02/2017   Chronic respiratory failure with hypoxia ([place]) 09/23/2017   BPH associated with nocturia 06/25/2017   Leukocytosis 12/19/2016   Hyperglycemia 06/12/2016   Hyperlipidemia 07/22/2014   Onychomycosis 07/22/2014   Left ankle swelling 07/22/2014   History of skin cancer 05/17/2014   Chronic rhinitis 10/28/2012   Pulmonary nodule 09/23/2011   Former smoker 08/23/2008   History of colonic polyps 08/23/2008    Orientation RESPIRATION BLADDER Height & Weight     Self, Time, Situation, Place  O2 (2L Nasal cannula) Continent Weight: 193 lb 9 oz (87.8 kg) Height:  6' (182.9 cm)  BEHAVIORAL SYMPTOMS/MOOD NEUROLOGICAL BOWEL NUTRITION STATUS      Continent Diet (See dc summary)  AMBULATORY STATUS COMMUNICATION OF NEEDS Skin   Limited Assist Verbally Normal                       Personal Care Assistance Level of Assistance  Bathing, Feeding, Dressing Bathing Assistance: Limited assistance Feeding assistance: Independent Dressing Assistance: Limited assistance     Functional Limitations Info             [place]  PT (By licensed PT), OT (By licensed OT)     PT Frequency: 5x/week OT Frequency: 5x/week            Contractures Contractures Info: Not present    Additional Factors Info  Code Status, Allergies Code Status Info: DNR Allergies Info: Tape, Augmentin (Amoxicillin-pot Clavulanate), Cipro (Ciprofloxacin Hcl), Gadavist (Gadobutrol), Levaquin (Levofloxacin), Lipitor (Atorvastatin)  Current Medications (08/13/2022):  This is the current hospital active medication list Current Facility-Administered Medications  Medication Dose Route Frequency Provider Last Rate Last Admin   acetaminophen (TYLENOL) tablet 1,000 mg  1,000 mg Oral Q6H Cirigliano, Vito V, DO   1,000 mg at 08/13/22 0908   albuterol (PROVENTIL) (2.5 MG/3ML) 0.083% nebulizer solution 2.5 mg  2.5 mg  Nebulization Q6H PRN Cirigliano, Vito V, DO       [START ON 08/16/2022] apixaban (ELIQUIS) tablet 2.5 mg  2.5 mg Oral BID Thurnell Lose, MD       bisacodyl (DULCOLAX) EC tablet 10 mg  10 mg Oral Daily PRN Cirigliano, Vito V, DO       docusate sodium (COLACE) capsule 200 mg  200 mg Oral BID Cirigliano, Vito V, DO   200 mg at 08/13/22 0909   fentaNYL (DURAGESIC) 50 MCG/HR 1 patch  1 patch Transdermal Q72H Cirigliano, Vito V, DO   1 patch at 08/10/22 1713   ferrous sulfate tablet 325 mg  325 mg Oral QODAY Cirigliano, Vito V, DO   325 mg at 08/13/22 0909   metoprolol tartrate (LOPRESSOR) injection 5 mg  5 mg Intravenous Q8H PRN Cirigliano, Vito V, DO       [START ON 08/14/2022] metoprolol tartrate (LOPRESSOR) tablet 25 mg  25 mg Oral BID Thurnell Lose, MD       midodrine (PROAMATINE) tablet 5 mg  5 mg Oral BID WC Thurnell Lose, MD   5 mg at 08/13/22 0910   mometasone-formoterol (DULERA) 200-5 MCG/ACT inhaler 2 puff  2 puff Inhalation BID Cirigliano, Vito V, DO       oxyCODONE (Oxy IR/ROXICODONE) immediate release tablet 5 mg  5 mg Oral Q6H PRN Cirigliano, Vito V, DO   5 mg at 08/11/22 2136   pantoprazole (PROTONIX) EC tablet 40 mg  40 mg Oral BID Cirigliano, Vito V, DO   40 mg at 08/13/22 0908   polyethylene glycol (MIRALAX / GLYCOLAX) packet 17 g  17 g Oral Daily Cirigliano, Vito V, DO   17 g at 08/11/22 1106   predniSONE (DELTASONE) tablet 10 mg  10 mg Oral Q breakfast Cirigliano, Vito V, DO   10 mg at 08/13/22 9794   roflumilast (DALIRESP) tablet 500 mcg  500 mcg Oral Daily Cirigliano, Vito V, DO   500 mcg at 08/12/22 1256   rosuvastatin (CRESTOR) tablet 10 mg  10 mg Oral Daily Cirigliano, Vito V, DO   10 mg at 08/13/22 0909   sodium chloride flush (NS) 0.9 % injection 3 mL  3 mL Intravenous Q12H Cirigliano, Vito V, DO   3 mL at 08/13/22 0910   umeclidinium bromide (INCRUSE ELLIPTA) 62.5 MCG/ACT 1 puff  1 puff Inhalation Daily Cirigliano, Vito V, DO         Discharge Medications: Please  see discharge summary for a list of discharge medications.  Relevant Imaging Results:  Relevant Lab Results:   Additional Information SSn: [place] [facility] [place], [place]

## 2022-08-13 NOTE — Anesthesia Postprocedure Evaluation (Signed)
Anesthesia Post Note  Patient: Francisco [name]  Procedure(s) Performed: ESOPHAGOGASTRODUODENOSCOPY (EGD) WITH PROPOFOL HOT HEMOSTASIS (ARGON PLASMA COAGULATION/BICAP) HEMOSTASIS CLIP PLACEMENT SUBMUCOSAL INJECTION     Patient location during evaluation: PACU Anesthesia Type: MAC Level of consciousness: awake and alert Pain management: pain level controlled Vital Signs Assessment: post-procedure vital signs reviewed and stable Respiratory status: spontaneous breathing, nonlabored ventilation, respiratory function stable and patient connected to nasal cannula oxygen Cardiovascular status: stable and blood pressure returned to baseline Postop Assessment: no apparent nausea or vomiting Anesthetic complications: no   No notable events documented.  Last Vitals:  Vitals:   08/13/22 0815 08/13/22 0831  BP:  114/61  Pulse:  91  Resp: 18 19  Temp:  37.1 C  SpO2:      Last Pain:  Vitals:   08/13/22 0831  TempSrc: Oral  PainSc:                  [place] S

## 2022-08-13 NOTE — Progress Notes (Signed)
Physical Therapy Treatment Patient Details Name: Francisco [name] MRN: [id number] DOB: [birth year] Today's Date: 08/13/2022   History of Present Illness 77 y/o M admitted to [facility] on 2/2 from PCP office for dark stools and GI bleed. Pt had a fall ~2 weeks ago, reporting LBP. Imaging reveals L2 compression fracture, managing through medication, no need for brace at this time per ortho. PMHx: COPD, venous stasis, PAD, anemia, HLD, history of GI bleed with AVM, BPH, lung cancer on observation, essential tremor, HTN, CHF, PE on Eliquis.    PT Comments    Pt limited during today's session by fatigue with mobility as well as pain in his low back. Pt requiring minA for bed mobility and transfers, able to take ~2-3 steps with RW and minA to min guard, but pt declining any further attempts at mobility. Pt's wife expressing concern with caring for pt at home alone, discussed with pt further discharge options, currently family requesting further therapy prior to return home, therefore discharge plans updated. Pt will continue to benefit from skilled acute PT to progress mobility as well as activity tolerance, recommend SNF at discharge as pt unlikely to tolerate AIR at this time.     Recommendations for follow up therapy are one component of a multi-disciplinary discharge planning process, led by the attending physician.  Recommendations may be updated based on patient status, additional functional criteria and insurance authorization.  Follow Up Recommendations  Skilled nursing-short term rehab (<3 hours/day) Can patient physically be transported by private vehicle: No   Assistance Recommended at Discharge Frequent or constant Supervision/Assistance  Patient can return home with the following A little help with walking and/or transfers;A little help with bathing/dressing/bathroom;Assistance with cooking/housework;Assist for transportation;Help with stairs or ramp for entrance   Equipment Recommendations   Other (comment);BSC/3in1 (transport chair)    Recommendations for Other Services OT consult     Precautions / Restrictions Precautions Precautions: Fall Precaution Comments: L2 compression fracture, with history of other chronic compression fractures Restrictions Weight Bearing Restrictions: No     Mobility  Bed Mobility Overal bed mobility: Needs Assistance Bed Mobility: Sit to Supine, Supine to Sit     Supine to sit: Min assist, HOB elevated Sit to supine: Min assist, HOB elevated   General bed mobility comments: minA for trunk support for supine>sit, minA for BLE management for sit>supine. Of note, pt sleeps in a lift chair at home    Transfers Overall transfer level: Needs assistance Equipment used: Rolling walker (2 wheels) Transfers: Sit to/from Stand, Bed to chair/wheelchair/BSC Sit to Stand: Min assist   Step pivot transfers: Min assist       General transfer comment: minA provided for powerup and balance for sit>stand, minA for safety and balance with transfer to/from [facility]. Cueing provided for proper hand placement for transfers    Ambulation/Gait             Pre-gait activities: Able to side step to/from [facility] as well as take ~3 steps but requesting to return to bed when attempting to ambulate around the bed General Gait Details: pt declining due to fatigue   Stairs             Wheelchair Mobility    Modified Rankin (Stroke Patients Only)       Balance Overall balance assessment: Needs assistance Sitting-balance support: Feet supported, Single extremity supported Sitting balance-Leahy Scale: Fair Sitting balance - Comments: stable balance with static sitting on EOB   Standing balance support: During functional activity,  Bilateral upper extremity supported, Reliant on assistive device for balance Standing balance-Leahy Scale: Poor Standing balance comment: pt utilizing RW for all standing and pivoting mobility, relying on RW for support,  no LOB noted, min guard to minA provided for safety                            Cognition Arousal/Alertness: Awake/alert Behavior During Therapy: WFL for tasks assessed/performed Overall Cognitive Status: Within Functional Limits for tasks assessed                                 General Comments: irritated but agreeable to session        Exercises      General Comments General comments (skin integrity, edema, etc.): HR elevating to 120s with mobility, on 4L O2 Pace throughout session      Pertinent Vitals/Pain Pain Assessment Pain Assessment: Faces Faces Pain Scale: Hurts little more Pain Location: back and generalized Pain Descriptors / Indicators: Aching Pain Intervention(s): Monitored during session, Repositioned, Limited activity within patient's tolerance    Home Living                          Prior Function            PT Goals (current goals can now be found in the care plan section) Acute Rehab PT Goals Patient Stated Goal: go home PT Goal Formulation: With patient Time For Goal Achievement: 08/24/22 Potential to Achieve Goals: Good Progress towards PT goals: Progressing toward goals    Frequency    Min 3X/week      PT Plan Discharge plan needs to be updated    Co-evaluation              AM-PAC PT "6 Clicks" Mobility   Outcome Measure  Help needed turning from your back to your side while in a flat bed without using bedrails?: A Little Help needed moving from lying on your back to sitting on the side of a flat bed without using bedrails?: A Little Help needed moving to and from a bed to a chair (including a wheelchair)?: A Little Help needed standing up from a chair using your arms (e.g., wheelchair or bedside chair)?: A Little Help needed to walk in hospital room?: A Lot Help needed climbing 3-5 steps with a railing? : A Lot 6 Click Score: 16    End of Session Equipment Utilized During Treatment: Oxygen  (4L O2 ) Activity Tolerance: Patient limited by fatigue;Patient limited by pain Patient left: in bed;with call bell/phone within reach;with family/visitor present Nurse Communication: Mobility status PT Visit Diagnosis: History of falling (Z91.81);Muscle weakness (generalized) (M62.81);Other abnormalities of gait and mobility (R26.89)     Time: 9381-8299 PT Time Calculation (min) (ACUTE ONLY): 43 min  Charges:  $Therapeutic Activity: 38-52 mins                     Charlynne Cousins, PT DPT Acute Rehabilitation Services Office [phone]    Luvenia Heller 08/13/2022, 1:40 PM

## 2022-08-13 NOTE — Care Management Important Message (Signed)
Important Message  Patient Details  Name: Francisco [name] MRN: [id number] Date of Birth: [birth year]   Medicare Important Message Given:  Yes     Orbie Pyo 08/13/2022, 3:08 PM

## 2022-08-13 NOTE — TOC Initial Note (Addendum)
Transition of Care [facility]) - Initial/Assessment Note    Patient Details  Name: Francisco [name] MRN: [id number] Date of Birth: 1946/04/20  Transition of Care [facility]) CM/SW Contact:    Benard Halsted, LCSW Phone Number: 08/13/2022, 1:50 PM  Clinical Narrative:                 CSW received consult for possible SNF placement at time of discharge. CSW spoke with patient and spouse at bedside. Patient reported that patient's spouse is currently unable to care for patient at their home given patient's current physical needs and fall risk. Patient expressed understanding of PT recommendation and is agreeable to SNF placement at time of discharge. CSW discussed insurance authorization process and will provide Medicare SNF ratings list. CSW will send out referrals for review and provide bed offers as available.   [facility]. CSW explained that [facility] wanted them to know that it is a semi private room and they have RSV cases on the hall. Patient and spouse request a private room so asked CSW about [facility]. CSW confirmed with Sharyn Lull at [facility] that they have a private room available for patient tomorrow if stable. CSW initiated insurance authorization, Ref# [id number].  Skilled Nursing Rehab Facilities-   [email or web address]   Ratings out of 5 stars (5 the highest)   Name Address  Phone # [place] Inspection Overall  [facility] [address] [place] 4 5 2 3   [facility]  [address] [place], [place] [phone] 4 2 5 5   [facility] [place], [place] 1 3 1 1   [place], [place] 2 2 4 4   [facility] [address], [place] 2 1 2 1   [place] 3 3 4 4   [facility] [address] [place] 4 1 3 2   [facility] [address] [place] 4 1 3 2   [address] ([place]) [place], [place] [phone] 3 1 2 1   [facility] [address], [place] [phone] 3 1 1 1   [facility] [address] [facility] [phone] 3 2 2 2   [facility] ([place]) [place], [place] [phone] 3 1 1 1   Dustin Flock [address] [place] [phone] 4 2 4 4           [place], [place]      [facility] [place] 4 1 3 2   [facility] [place] [address], [place] 3 1 5 4   [place], [place] [phone] 1 1 2 1   [facility] [address] [employer] [phone] 2 2 4 4           [address] (no [facility]) [address], [place] [phone] 5 5 5 5   [facility] (No Humana) [address], [place] 4 1 4 3   [place] (No UHC) [place], [place] [phone] 5 5 5 5   [facility] [address], [employer] [phone] 2 3 5 5   [facility] [address] [place] 1 1 2 1   [facility] [address], [place] [phone] 3 1 1 1   [place], [place] 5 2 5 5   [facility]  [address], [place]  [phone] 2 2 1 1   [facility] [address] [place] 3 2 1 1   [facility] [place], [place] 2 2 2 2           [facility] [address] [place] [phone] 1 1 1 1   [place] [address] [place]  [phone] 2 4 3 3   [facility] [place] [address], [place] [phone] 3 2 3 3   [place], [place] 2 1 1 1   [place] (No Humana) [address] [place] [phone] 2 2 3 3   [facility]) [address], [place] [phone] 2 1 1 1            [facility] [place], [place] 5 4 5 5   [facility])  [address], [place] 2 1 2 1   [facility] [place]) [place] [address] [place] 3 1 4 3   [place] [address] [place] 3 3 4 4   [address] [place], [place] 2 3 1 1   [place] [facility]) [address] [place] [phone] 2 1 4 3      Expected Discharge Plan: [place] Barriers to Discharge: Continued Medical Work up, Insurance Authorization   Patient Goals and CMS Choice Patient states their goals for this hospitalization and ongoing recovery are:: Rehab CMS Medicare.gov Compare Post Acute Care list provided to:: Patient Choice offered to / list presented to : Patient [place] ownership interest in [facility].provided to:: Patient    Expected Discharge Plan and Services In-house Referral: Clinical Social Work   Post Acute Care Choice: [place] Living arrangements for the past 2 months: [place]                                      Prior Living Arrangements/Services Living arrangements for the past 2 months: Single Family Home Lives with:: Spouse Patient language and need for interpreter reviewed:: Yes Do you feel safe going back to the place where you live?: Yes      Need for Family Participation in Patient Care: No (Comment) Care giver support system in place?: Yes (comment)   Criminal Activity/Legal Involvement Pertinent to Current Situation/Hospitalization: No - Comment as needed  Activities of Daily Living      Permission Sought/Granted Permission sought to share information with : Facility Sport and exercise psychologist, Family Supports Permission granted to share information with : Yes, Verbal Permission Granted  Share Information with NAME: Katy Apo  Permission granted to share info w AGENCY:  SNFs  Permission granted to share info w Relationship: Sp[place]r  Permission granted to share info w Contact Information: [phone]  Emotional Assessment Appearance:: Appears stated age Attitude/Demeanor/Rapport: Engaged Affect (typically observed): Accepting, Appropriate, Pleasant Orientation: : Oriented to Self, Oriented to Place, Oriented to  Time, Oriented to Situation Alcohol / Substance Use: Not Applicable Psych Involvement: No (comment)  Admission diagnosis:  Rectal bleeding [K62.5] GI bleed [K92.2] Black stool [K92.1] Lumbar compression fracture, closed, initial encounter ([place]) [S32.000A] Constipation, unspecified constipation type [K59.00] Acute low back pain, unspecified back pain laterality, unspecified whether sciatica present [M54.50] Patient Active Problem List   Diagnosis Date Noted   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   Gastritis and gastroduodenitis 08/12/2022   AVM (arteriovenous malformation) of  duodenum, acquired 08/12/2022   ABLA (acute blood loss anemia) 08/12/2022   Melena 08/11/2022   GI bleed 31/54/0086   Chronic systolic congestive heart failure ([place]) 12/10/2021   AVM (arteriovenous malformation) of small bowel, acquired 11/21/2020   Iron deficiency anemia due to chronic blood loss 08/23/2020   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   History of upper GI bleeding 04/30/2020   Hiatal hernia 04/30/2020   Chronic anticoagulation 04/30/2020   Anemia 04/30/2020   Malignant neoplasm of lower lobe of right lung ([place])    Acute GI bleeding 02/18/2020   PAD (peripheral artery disease) ([place]) 12/11/2018   Pre-operative respiratory examination 12/10/2018   Stage 4 very severe COPD by GOLD classification ([place]) 11/27/2018   DNR (do not resuscitate) 76/19/5093   Diastolic dysfunction 26/71/2458   Chronic pulmonary embolism ([place]) 04/07/2018   Essential tremor 03/31/2018   BPPV (benign paroxysmal positional vertigo), right 02/10/2018   Osteoporosis  11/19/2017   Thoracic compression fracture ([place]) 11/02/2017   Essential hypertension 11/02/2017   Venous stasis dermatitis of both lower extremities 11/02/2017   Chronic respiratory failure with hypoxia ([place]) 09/23/2017   BPH associated with nocturia 06/25/2017   Leukocytosis 12/19/2016   Hyperglycemia 06/12/2016   Hyperlipidemia 07/22/2014   Onychomycosis 07/22/2014   Left ankle swelling 07/22/2014   History of skin cancer 05/17/2014   Chronic rhinitis 10/28/2012   Pulmonary nodule 09/23/2011   Former smoker 08/23/2008   History of colonic polyps 08/23/2008   PCP:  [name], MD Pharmacy:   [id number] [place], [address] [place] [address] Phone: [phone] Fax: [phone]  [place] [address] Phone: [phone] Fax: [phone]     Social Determinants of Health (SDOH) Social History: SDOH Screenings   Depression (PHQ2-9): Low Risk  (08/09/2022)  Tobacco Use: Medium Risk (08/12/2022)   SDOH Interventions:     Readmission Risk Interventions    08/13/2022    1:46 PM 01/31/2020   10:35 AM  Readmission Risk Prevention Plan  Transportation Screening Complete Complete  PCP or Specialist Appt within 5-7 Days Complete Complete  Home Care Screening Complete Complete  Medication Review (RN CM) Complete Complete

## 2022-08-13 NOTE — Progress Notes (Addendum)
Attending physician's note   I have taken a history, reviewed the chart, and examined the patient. I performed a substantive portion of this encounter, including complete performance of at least one of the key components, in conjunction with the APP. I agree with the APP's note, impression, and recommendations with my edits.   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  No overt bleeding and H/H stable.  Inpatient GI service will sign off at this time.  Please do not hesitate to contact us with additional questions or concerns  Gerrit Heck, DO, FACG [phone] office          Progress Note  Primary GI: Dr. Rush Landmark   Subjective  Chief Complaint:melena in setting of chronic Eliquis   Patient with family at bedside, wife Lilly. Provided some of the history.  Patient states had bowel movement during the night, per wife have been brown, formed. Patient denies abdominal pain, nausea or vomiting.    Objective   Vital signs in last 24 hours: Temp:  [97.6 F (36.4 C)-98.8 F (37.1 C)] 98.5 F (36.9 C) (02/06 1222) Pulse Rate:  [66-98] 98 (02/06 1222) Resp:  [12-35] 14 (02/06 1222) BP: (83-141)/(51-80) 111/66 (02/06 1222) SpO2:  [95 %-99 %] 99 % (02/06 1222) FiO2 (%):  [36 %] 36 % (02/06 0433) Weight:  [87.8 kg] 87.8 kg (02/06 0500) Last BM Date :  (pta) Last BM recorded by nurses in past 5 days Stool Type: Type 6 (Mushy consistency with ragged edges) (08/12/2022  5:41 PM)  General:    Older white male in NAD on 4 L nasal cannula Heart: tachy  Regular rate and rhythm; no murmurs Lungs: Decreased breath sounds bilateral, mild expiratory wheese Abdomen:  Soft, nontender and nondistended. Normal bowel sounds. Extremities:  With edema and stasis dermatitis/plaques Neurologic:  Alert and oriented,  grossly normal neurologically. Psych:  Cooperative. Normal mood and affect.  Intake/Output from previous  day: 02/05 0701 - 02/06 0700 In: 19 [P.O.:420; I.V.:150] Out: 0  Intake/Output this shift: Total I/O In: 240 [P.O.:240] Out: -   Studies/Results: No results found.  Lab Results: Recent Labs    08/11/22 2012 08/12/22 0226 08/13/22 0522  WBC 14.1* 11.5* 9.4  HGB 10.3* 10.6* 10.2*  HCT 31.8* 32.7* 32.1*  PLT 230 233 250   BMET Recent Labs    08/11/22 0531 08/12/22 0226 08/13/22 0522  NA 138 134* 136  K 4.1 4.9 4.8  CL 102 96* 96*  CO2 29 29 31   GLUCOSE 95 103* 98  BUN 16 14 16   CREATININE 1.16 1.10 1.15  CALCIUM 8.4* 8.6* 8.6*   LFT No results for input(s): "PROT", "ALBUMIN", "AST", "ALT", "ALKPHOS", "BILITOT", "BILIDIR", "IBILI" in the last 72 hours.  PT/INR No results for input(s): "LABPROT", "INR" in the last 72 hours.    Scheduled Meds:  acetaminophen  1,000 mg Oral Q6H   [START ON 08/16/2022] apixaban  2.5 mg Oral BID   docusate sodium  200 mg Oral BID   fentaNYL  1 patch Transdermal Q72H   ferrous sulfate  325 mg Oral QODAY   [START ON 08/14/2022] metoprolol tartrate  25 mg Oral BID   midodrine  5 mg Oral BID WC   mometasone-formoterol  2 puff Inhalation BID   pantoprazole  40 mg Oral BID   polyethylene glycol  17 g Oral Daily   predniSONE  10 mg Oral Q breakfast   roflumilast  500 mcg Oral Daily   rosuvastatin  10 mg Oral Daily   sodium chloride flush  3 mL Intravenous Q12H   umeclidinium bromide  1 puff Inhalation Daily   Continuous Infusions:    Impression/Plan:   Acute/subacute GI bleed with melena at home with chronic Eliquis currently on hold Previously duodenal AVM treated APC 2021 02/05 EGD with duo AVM x2 (APC) and gastric Dieulafoy (clips x2, Epi) No further melena Hemoglobin stable at 10.2 12/10/2021 iron 74, ferritin 35.5 Will check iron and ferritin, if low consider IV iron to help improve respiratory status Continue Protonix 40 mg twice daily for at least 8 weeks and then can go to once daily Wait at least 72 hours for restarting  Eliquis, need to have discussion with [name] or prescribing physician about the need to continue anticoagulation versus decreasing down to 2.5 mg twice daily. Can plan for outpatient follow-up in our office in about 8 weeks to evaluate how patient is doing.  history of PE for which he is on Eliquis Only 1 episode of PE, currently being observed for lung cancer, history of PAD Uncertain if patient needs to be continued on Eliquis, with recent lung cancer diagnosis patient is higher coagulable state. Will need to discuss with [name] decreasing Eliquis down to 2.5 mg twice daily versus stopping, need reevaluation of continuing anticoagulation.   lung cancer with COPD stage 4 Status post radiation currently just on observation  peripheral arterial disease  Congestive heart failure  Chronic EtOH use  History of adenomatous colon polyps  last colonoscopy was greater than 10 years ago  Principal Problem:   GI bleed Active Problems:   Hyperlipidemia   BPH associated with nocturia   Chronic respiratory failure with hypoxia (HCC)   Essential hypertension   Essential tremor   Chronic pulmonary embolism ([place])   Stage 4 very severe COPD by GOLD classification ([place])   PAD (peripheral artery disease) ([place])   Malignant neoplasm of lower lobe of right lung (HCC)   Iron deficiency anemia due to chronic blood loss   Chronic systolic congestive heart failure (HCC)   Melena   Dieulafoy lesion of stomach   Gastritis and gastroduodenitis   AVM (arteriovenous malformation) of duodenum, acquired   ABLA (acute blood loss anemia)    LOS: 3 days   Vladimir Crofts  08/13/2022, 2:39 PM

## 2022-08-13 NOTE — Progress Notes (Signed)
PROGRESS NOTE                                                                                                                                                                                                             Patient Demographics:    Francisco [name], is a 77 y.o. male, DOB - 20-Dec-1945, MOQ:[id number]  Outpatient Primary MD for the patient is Francisco Olp, MD    LOS - 3  Admit date - 08/09/2022    Chief Complaint  Patient presents with   GI Bleeding       Brief Narrative (HPI from H&P)   77 y.o. male with medical history significant of COPD, venous stasis, PAD, anemia, hyperlipidemia, history of GI bleed with AVM, BPH, lung cancer on observation, essential tremor, hypertension, CHF, PE on Eliquis presenting with dark stools from PCP office.   Patient has had dark stools for the past 2 days.  Is on iron therapy but PCP felt he looked pale and sent him to the ED for further evaluation for possible GI bleed.  This is in the setting of history of known prior GI bleed and currently on Eliquis.  Patient additionally had a fall 2 weeks ago and has had some low back pain since that time.  Additionally is reporting some constipation.  In the ER he was found to have another L-spine compression fracture acute on chronic, of note he was due to be seen at [facility] in the next coming days, he was also found to be anemic and admitted to the hospital for pain control and anemia treatment.   Subjective:   Patient in bed, appears comfortable, denies any headache, no fever, no chest pain or pressure, no shortness of breath , no abdominal pain. No new focal weakness.   Assessment  & Plan :   Upper GI bleed with bleeding, with acute blood loss related anemia.  Has heme positive and melanotic stools, currently on IV PPI, H&H closely being monitored, type screen done patient is agreeable for transfusion if needed.  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  Advance activity PT OT.   Acute on chronic low  back pain.  Has history of L-spine fractures in the past, had fall 2 weeks ago after which looks like he has a subacute L2 compression fracture as well.  Currently pain control, PT OT, seen by orthopedics here continue present medical treatment with LSO brace and pain control, PT OT, post discharge follow-up with Dr. [name] at [facility] in 1 week.  Severe underlying COPD with cor pulmonale and pulmonary hypertension.  He is on 4 to 5 L of oxygen at all times, currently also has evidence of mild fluid overload in the lungs, continue home nebulizer treatments, oxygen, encouraged to sit up in chair use I-S and flutter valve for pulmonary toiletry.  Will diurese on top of his lung medications and monitor.  Acute on chronic diastolic CHF EF 90%.  Does have evidence of fluid overload continue gentle diuresis and monitor.  Has been placed on beta-blocker.  PAD.  No acute issues.  Continue statin for secondary prevention.  History of PE.  On Eliquis currently on hold due to GI bleed, this has been at least 2 years or more.  Dyslipidemia.  On statin.  History of lung cancer.  Outpatient follow-up with his pulmonologist [name] and his oncologist.  Hypertension.  Blood pressure slightly low morning of 08/13/2022.  Skip today's Lopressor.  Gentle midodrine x 1 and monitor.      Condition - Extremely Guarded  Family Communication  :  wife on 08/10/22  Code Status :  DNR  Consults  :  GI, Ortho  PUD Prophylaxis : PPI   Procedures  :     EGD - Impression:               - Normal esophagus.                           - 3 cm hiatal hernia.                           - Hematin (altered blood/coffee-ground-like                             material) in the gastric fundus and in the gastric                            body.                           - Dieulafoy lesion of stomach located in the                            gastric fundus. This was treated with hemostatic                            clips x2 and epinephrine injection.                           - Mild, non-ulcer gastritis. Biopsied.                           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 Treated with  argon plasma coagulation (APC). Recommendation:           - Return patient to hospital ward for ongoing care.                           - Advance diet as tolerated.                           - Await pathology results.                           - Use Protonix (pantoprazole) 40 mg PO BID for 8                            weeks to promote mucosal healing, then reduce to 40                            mg daily. Resume PPI for prophylaxis if requiring                            ongoing anticoagulation.                           - If planning to restart anticoagulation, would                            wait at least 72 hours to allow for mucosal healing                            after endoscopic intervention today.  Echocardiogram.  1. Left ventricular ejection fraction, by estimation, is 55 to 60%. The left ventricle has normal function. The left ventricle has no regional wall motion abnormalities. Left ventricular diastolic function could not be evaluated.  2. Right ventricular systolic function is normal. The right ventricular size is normal.  3. A small pericardial effusion is present.  4. No evidence of mitral valve regurgitation.  5. The aortic valve was not well visualized. Aortic valve regurgitation is not visualized.  6. Not well visualized.  7. The inferior vena cava is normal in size with greater than 50% respiratory variability, suggesting right atrial pressure of 3 mmHg.  CT scan abdomen pelvis.  1. Acute appearing compression  fracture of L2 with mild retropulsion of fracture fragments. Please correlate clinically. 2. Left inguinal hernia containing colon has increased in size. No bowel obstruction. 3. Stable right inguinal hernia containing bladder. 4. 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 Aortic Atherosclerosis (ICD10-I70.0).  CT L-spine. - 1. Acute to subacute fracture deformity of the L2 vertebral body is new when compared with the MRI from 07/27/2022. Loss of approximately 50% of the central vertebral body height is identified. Mild retropulsion is identified along the posterior aspect of the superior endplate. 2. Chronic fracture deformity involving the L4 vertebral body is again noted with loss of approximately 70% of the central vertebral body height. 3. Age-indeterminate L3 inferior endplate compression deformity is unchanged when compared with the MRI from 07/27/22      Disposition Plan  :    Status is: Inpt  DVT Prophylaxis  :    apixaban (ELIQUIS) tablet  2.5 mg Start: 08/16/22 1000 Place and maintain sequential compression device Start: 08/10/22 0554 SCDs Start: 08/09/22 1844 apixaban (ELIQUIS) tablet 2.5 mg    Lab Results  Component Value Date   PLT 250 08/13/2022    Diet :  Diet Order             DIET SOFT Room service appropriate? Yes; Fluid consistency: Thin  Diet effective now                    Inpatient Medications  Scheduled Meds:  acetaminophen  1,000 mg Oral Q6H   [START ON 08/16/2022] apixaban  2.5 mg Oral BID   docusate sodium  200 mg Oral BID   fentaNYL  1 patch Transdermal Q72H   ferrous sulfate  325 mg Oral QODAY   [START ON 08/14/2022] metoprolol tartrate  25 mg Oral BID   mometasone-formoterol  2 puff Inhalation BID   pantoprazole  40 mg Oral BID   polyethylene glycol  17 g Oral Daily   predniSONE  10 mg Oral Q breakfast   roflumilast  500 mcg Oral Daily   rosuvastatin  10 mg Oral Daily   sodium chloride flush  3 mL Intravenous Q12H   umeclidinium bromide   1 puff Inhalation Daily   Continuous Infusions: PRN Meds:.albuterol, bisacodyl, metoprolol tartrate, oxyCODONE  Antibiotics  :    Anti-infectives (From admission, onward)    None         Objective:   Vitals:   08/13/22 0812 08/13/22 0813 08/13/22 0814 08/13/22 0815  BP:      Pulse:      Resp: (!) 27 17 17 18   Temp:      TempSrc:      SpO2:      Weight:      Height:        Wt Readings from Last 3 Encounters:  08/13/22 87.8 kg  06/06/22 82.6 kg  05/02/22 80.7 kg     Intake/Output Summary (Last 24 hours) at 08/13/2022 0819 Last data filed at 08/12/2022 1400 Gross per 24 hour  Intake 570 ml  Output 0 ml  Net 570 ml     Physical Exam  Awake Alert, No new F.N deficits, Normal affect [place].AT,PERRAL Supple Neck, No JVD,   Symmetrical Chest wall movement, Mod air movement bilaterally, few rales RRR,No Gallops, Rubs or new Murmurs,  +ve B.Sounds, Abd Soft, No tenderness,   1+ edema R>L       Data Review:    Recent Labs  Lab 08/09/22 1330 08/10/22 0322 08/10/22 1123 08/10/22 1919 08/11/22 0531 08/11/22 2012 08/12/22 0226 08/13/22 0522  WBC 10.7*   < > 16.0*  --  9.6 14.1* 11.5* 9.4  HGB 11.0*   < > 11.0* 10.9* 9.7* 10.3* 10.6* 10.2*  HCT 34.7*   < > 34.7* 33.0* 29.4* 31.8* 32.7* 32.1*  PLT 307   < > 318  --  222 230 233 250  MCV 99.4   < > 96.9  --  96.1 97.5 98.8 98.8  MCH 31.5   < > 30.7  --  31.7 31.6 32.0 31.4  MCHC 31.7   < > 31.7  --  33.0 32.4 32.4 31.8  RDW 13.2   < > 13.1  --  13.2 13.1 13.1 13.2  LYMPHSABS 0.5*  --   --   --  0.7  --  0.9 1.0  MONOABS 0.5  --   --   --  1.0  --  1.2* 1.0  EOSABS 0.0  --   --   --  0.3  --  0.2 0.4  BASOSABS 0.1  --   --   --  0.0  --  0.1 0.1   < > = values in this interval not displayed.    Recent Labs  Lab 08/09/22 1330 08/10/22 0322 08/10/22 1123 08/11/22 0531 08/12/22 0226 08/13/22 0522  NA 139 138  --  138 134* 136  K 4.7 3.9  --  4.1 4.9 4.8  CL 99 100  --  102 96* 96*  CO2 28 27  --  29  29 31   ANIONGAP 12 11  --  7 9 9   GLUCOSE 108* 80  --  95 103* 98  BUN 12 11  --  16 14 16   CREATININE 1.12 1.15  --  1.16 1.10 1.15  AST 23 19  --   --   --   --   ALT 18 16  --   --   --   --   ALKPHOS 90 84  --   --   --   --   BILITOT 0.7 0.7  --   --   --   --   ALBUMIN 3.2* 2.9*  --   --   --   --   INR 1.2  --   --   --   --   --   TSH  --   --  2.962  --   --   --   BNP  --   --   --  104.9* 147.0* 144.5*  MG  --   --  1.9 1.9 2.3 2.1  CALCIUM 9.2 8.7*  --  8.4* 8.6* 8.6*      Radiology Reports ECHOCARDIOGRAM COMPLETE  Result Date: 08/10/2022    ECHOCARDIOGRAM REPORT   Patient Name:   JASKARAN DAUZAT II Date of Exam: 08/10/2022 Medical Rec #:  [id number]         Height:       72.0 in Accession #:    [id number]        Weight:       195.1 lb Date of Birth:  04-07-1946         BSA:          2.108 m Patient Age:    81 years          BP:           151/78 mmHg Patient Gender: M                 HR:           116 bpm. Exam Location:  Inpatient Procedure: 2D Echo, Color Doppler, Cardiac Doppler and Intracardiac            Opacification Agent Indications:    CHF - Acute Diastolic  History:        Patient has prior history of Echocardiogram examinations, most                 recent 11/04/2017. CHF, PAD and COPD; Signs/Symptoms:Hypotension,                 Edema and Dyspnea. Lung cancer, pulmonary embolism.  Sonographer:    Eartha Inch Referring Phys: Graylin Shiver [facility]  Sonographer Comments: Technically difficult study due to poor echo windows. Image acquisition challenging due to patient body habitus and Image acquisition challenging due to respiratory motion.  IMPRESSIONS  1. Left ventricular ejection fraction, by estimation, is 55 to 60%. The left ventricle has normal function. The left ventricle has no regional wall motion abnormalities. Left ventricular diastolic function could not be evaluated.  2. Right ventricular systolic function is normal. The right ventricular size is normal.  3. A  small pericardial effusion is present.  4. No evidence of mitral valve regurgitation.  5. The aortic valve was not well visualized. Aortic valve regurgitation is not visualized.  6. Not well visualized.  7. The inferior vena cava is normal in size with greater than 50% respiratory variability, suggesting right atrial pressure of 3 mmHg. FINDINGS  Left Ventricle: Left ventricular ejection fraction, by estimation, is 55 to 60%. The left ventricle has normal function. The left ventricle has no regional wall motion abnormalities. Definity contrast agent was given IV to delineate the left ventricular  endocardial borders. The left ventricular internal cavity size was normal in size. There is no left ventricular hypertrophy. Left ventricular diastolic function could not be evaluated. Right Ventricle: The right ventricular size is normal. Right ventricular systolic function is normal. Left Atrium: Left atrial size was normal in size. Right Atrium: Right atrial size was normal in size. Pericardium: A small pericardial effusion is present. Mitral Valve: No evidence of mitral valve regurgitation. Tricuspid Valve: Tricuspid valve regurgitation is not demonstrated. Aortic Valve: The aortic valve was not well visualized. Aortic valve regurgitation is not visualized. Pulmonic Valve: Pulmonic valve regurgitation is not visualized. Aorta: Not well visualized. Venous: The inferior vena cava is normal in size with greater than 50% respiratory variability, suggesting right atrial pressure of 3 mmHg. IAS/Shunts: The interatrial septum was not well visualized.  LEFT VENTRICLE PLAX 2D LVIDd:         4.00 cm LVIDs:         3.10 cm LV PW:         1.10 cm LV IVS:        0.90 cm  LEFT ATRIUM           Index LA diam:      3.50 cm 1.66 cm/m LA Vol (A4C): 25.1 ml 11.91 ml/m Phineas Inches Electronically signed by Phineas Inches Signature Date/Time: 08/10/2022/5:09:21 PM    Final    CT L-SPINE NO CHARGE  Result Date: 08/09/2022 CLINICAL DATA:   Abdominal pain.  Status post fall EXAM: CT LUMBAR SPINE WITHOUT CONTRAST TECHNIQUE: Multidetector CT imaging of the lumbar spine was performed without intravenous contrast administration. Multiplanar CT image reconstructions were also generated. RADIATION DOSE REDUCTION: This exam was performed according to the departmental dose-optimization program which includes automated exposure control, adjustment of the mA and/or kV according to patient size and/or use of iterative reconstruction technique. COMPARISON:  Abdominal MRI from 07/27/2022 and CT chest 06/21/2022. FINDINGS: Segmentation: 5 lumbar type vertebrae. Alignment: Mild curvature of the lumbar spine is convex towards the left. Vertebrae: Acute to subacute fracture deformity is identified involving the L2 vertebral body. This is new when compared with the MRI from 07/27/2022. Loss of approximately 50% of the central vertebral body height is identified. Mild retropulsion is identified along the posterior aspect of the superior endplate, image 56/4. L3 mild inferior endplate compression deformity is unchanged when compared with the MRI from 07/27/22 with loss of approximately 10% of the central vertebral body height. Chronic fracture deformity involving the L4 vertebral body is again noted with loss of approximately 70% of the central vertebral body height. Paraspinal and other soft tissues: Negative. Disc  levels: Disc spaces are relatively well preserved. No significant degenerative disc disease. IMPRESSION: 1. Acute to subacute fracture deformity of the L2 vertebral body is new when compared with the MRI from 07/27/2022. Loss of approximately 50% of the central vertebral body height is identified. Mild retropulsion is identified along the posterior aspect of the superior endplate. 2. Chronic fracture deformity involving the L4 vertebral body is again noted with loss of approximately 70% of the central vertebral body height. 3. Age-indeterminate L3 inferior  endplate compression deformity is unchanged when compared with the MRI from 07/27/22. Electronically Signed   By: Kerby Moors M.D.   On: 08/09/2022 18:01   CT ABDOMEN PELVIS W CONTRAST  Result Date: 08/09/2022 CLINICAL DATA:  Acute abdominal pain EXAM: CT ABDOMEN AND PELVIS WITH CONTRAST TECHNIQUE: Multidetector CT imaging of the abdomen and pelvis was performed using the standard protocol following bolus administration of intravenous contrast. RADIATION DOSE REDUCTION: This exam was performed according to the departmental dose-optimization program which includes automated exposure control, adjustment of the mA and/or kV according to patient size and/or use of iterative reconstruction technique. CONTRAST:  70mL OMNIPAQUE IOHEXOL 350 MG/ML SOLN COMPARISON:  PET-CT 08/08/2021. MRI abdomen 07/27/2022. Lumbar spine MRI 06/06/2020. FINDINGS: Lower chest: Emphysema present Hepatobiliary: No focal liver abnormality is seen. No gallstones, gallbladder wall thickening, or biliary dilatation. Pancreas: There is a 1.7 x 1.7 cm low-attenuation/cystic area in the tail the pancreas which appears stable. No acute inflammation or ductal dilatation. Spleen: Normal in size without focal abnormality. Adrenals/Urinary Tract: Right inferior pole cyst measures 15 mm. Otherwise, the kidneys, adrenal glands and bladder are within normal limits. Stomach/Bowel: Stomach is within normal limits. Appendix is not seen. No evidence of bowel wall thickening, distention, or inflammatory changes. Vascular/Lymphatic: Aortic atherosclerosis. No enlarged abdominal or pelvic lymph nodes. Reproductive: Prostate gland is mildly enlarged. Other: Left inguinal hernia containing colon is moderate in size and has increased from prior study. There is a small right inguinal hernia containing bladder, unchanged. There is no ascites. Musculoskeletal: Chronic compression fracture of L4 is unchanged. There is a acute appearing compression fracture of L2 with  mild retropulsion of fracture fragments (30% vertebral body height loss). IMPRESSION: 1. Acute appearing compression fracture of L2 with mild retropulsion of fracture fragments. Please correlate clinically. 2. Left inguinal hernia containing colon has increased in size. No bowel obstruction. 3. Stable right inguinal hernia containing bladder. 4. 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 Aortic Atherosclerosis (ICD10-I70.0). Electronically Signed   By: Ronney Asters M.D.   On: 08/09/2022 17:56      Signature  -   Lala Lund M.D on 08/13/2022 at 8:19 AM   -  To page go to [email or web address]

## 2022-08-14 ENCOUNTER — Encounter: Payer: Self-pay | Admitting: Gastroenterology

## 2022-08-14 DIAGNOSIS — D62 Acute posthemorrhagic anemia: Secondary | ICD-10-CM | POA: Diagnosis not present

## 2022-08-14 DIAGNOSIS — K922 Gastrointestinal hemorrhage, unspecified: Secondary | ICD-10-CM | POA: Diagnosis not present

## 2022-08-14 DIAGNOSIS — K31819 Angiodysplasia of stomach and duodenum without bleeding: Secondary | ICD-10-CM | POA: Diagnosis not present

## 2022-08-14 DIAGNOSIS — J9621 Acute and chronic respiratory failure with hypoxia: Secondary | ICD-10-CM

## 2022-08-14 DIAGNOSIS — J441 Chronic obstructive pulmonary disease with (acute) exacerbation: Secondary | ICD-10-CM | POA: Diagnosis not present

## 2022-08-14 LAB — CBC WITH DIFFERENTIAL/PLATELET
Abs Immature Granulocytes: 0.07 10*3/uL (ref 0.00–0.07)
Basophils Absolute: 0.1 10*3/uL (ref 0.0–0.1)
Basophils Relative: 1 %
Eosinophils Absolute: 0.4 10*3/uL (ref 0.0–0.5)
Eosinophils Relative: 4 %
HCT: 34.3 % — ABNORMAL LOW (ref 39.0–52.0)
Hemoglobin: 11 g/dL — ABNORMAL LOW (ref 13.0–17.0)
Immature Granulocytes: 1 %
Lymphocytes Relative: 10 %
Lymphs Abs: 1 10*3/uL (ref 0.7–4.0)
MCH: 31.7 pg (ref 26.0–34.0)
MCHC: 32.1 g/dL (ref 30.0–36.0)
MCV: 98.8 fL (ref 80.0–100.0)
Monocytes Absolute: 1.1 10*3/uL — ABNORMAL HIGH (ref 0.1–1.0)
Monocytes Relative: 11 %
Neutro Abs: 7 10*3/uL (ref 1.7–7.7)
Neutrophils Relative %: 73 %
Platelets: 296 10*3/uL (ref 150–400)
RBC: 3.47 MIL/uL — ABNORMAL LOW (ref 4.22–5.81)
RDW: 13.2 % (ref 11.5–15.5)
WBC: 9.5 10*3/uL (ref 4.0–10.5)
nRBC: 0 % (ref 0.0–0.2)

## 2022-08-14 LAB — BASIC METABOLIC PANEL
Anion gap: 12 (ref 5–15)
BUN: 15 mg/dL (ref 8–23)
CO2: 34 mmol/L — ABNORMAL HIGH (ref 22–32)
Calcium: 8.7 mg/dL — ABNORMAL LOW (ref 8.9–10.3)
Chloride: 93 mmol/L — ABNORMAL LOW (ref 98–111)
Creatinine, Ser: 1.25 mg/dL — ABNORMAL HIGH (ref 0.61–1.24)
GFR, Estimated: >60 mL/min (ref >60)
Glucose, Bld: 92 mg/dL (ref 70–99)
Potassium: 4.1 mmol/L (ref 3.5–5.1)
Sodium: 139 mmol/L (ref 135–145)

## 2022-08-14 LAB — BRAIN NATRIURETIC PEPTIDE: B Natriuretic Peptide: 143.2 pg/mL — ABNORMAL HIGH (ref 0.0–100.0)

## 2022-08-14 LAB — MAGNESIUM: Magnesium: 2.1 mg/dL (ref 1.7–2.4)

## 2022-08-14 MED ORDER — SODIUM CHLORIDE 0.9 % IV SOLN
1.0000 g | INTRAVENOUS | Status: DC
  Administered 2022-08-14 – 2022-08-17 (×4): 1 g via INTRAVENOUS
  Filled 2022-08-14 (×4): qty 10

## 2022-08-14 MED ORDER — METHYLPREDNISOLONE SODIUM SUCC 125 MG IJ SOLR
80.0000 mg | Freq: Two times a day (BID) | INTRAMUSCULAR | Status: DC
  Administered 2022-08-14 – 2022-08-15 (×4): 80 mg via INTRAVENOUS
  Filled 2022-08-14 (×4): qty 2

## 2022-08-14 MED ORDER — DIGOXIN 0.25 MG/ML IJ SOLN
0.2500 mg | Freq: Once | INTRAMUSCULAR | Status: DC
  Filled 2022-08-14: qty 1

## 2022-08-14 MED ORDER — GUAIFENESIN ER 600 MG PO TB12
1200.0000 mg | ORAL_TABLET | Freq: Two times a day (BID) | ORAL | Status: DC
  Administered 2022-08-14 – 2022-08-17 (×7): 1200 mg via ORAL
  Filled 2022-08-14 (×7): qty 2

## 2022-08-14 MED ORDER — MIDODRINE HCL 5 MG PO TABS
10.0000 mg | ORAL_TABLET | Freq: Three times a day (TID) | ORAL | Status: DC
  Administered 2022-08-14 – 2022-08-15 (×3): 10 mg via ORAL
  Filled 2022-08-14 (×4): qty 2

## 2022-08-14 NOTE — Progress Notes (Signed)
Physical Therapy Treatment Patient Details Name: Francisco [name] MRN: [id number] DOB: 29-Feb-1946 Today's Date: 08/14/2022   History of Present Illness 77 y/o M admitted to [facility] on 2/2 from PCP office for dark stools and GI bleed. Pt had a fall ~2 weeks ago, reporting LBP. Imaging reveals L2 compression fracture, managing through medication, no need for brace at this time per ortho. PMHx: COPD, venous stasis, PAD, anemia, HLD, history of GI bleed with AVM, BPH, lung cancer on observation, essential tremor, HTN, CHF, PE on Eliquis.    PT Comments    Pt limited during today's session by fatigue and pain but able to transfer to chair with increased encouragement and assistance. Pt required modA for bed mobility and sit<>stand today, with min-modA for stand pivot transfer to bedside chair, with 1 LOB requiring modA to correct. Pt requiring increased time while seated EOB with increased cueing for plan and sequencing of task, rest breaks provided throughout due to fatigue. Pt continues to benefit from skilled acute PT to progress mobility and endurance with activity, discharge plan remains appropriate with family in agreement. Acute PT will continue to follow.    Recommendations for follow up therapy are one component of a multi-disciplinary discharge planning process, led by the attending physician.  Recommendations may be updated based on patient status, additional functional criteria and insurance authorization.  Follow Up Recommendations  Skilled nursing-short term rehab (<3 hours/day) Can patient physically be transported by private vehicle: No   Assistance Recommended at Discharge Frequent or constant Supervision/Assistance  Patient can return home with the following A little help with walking and/or transfers;A little help with bathing/dressing/bathroom;Assistance with cooking/housework;Assist for transportation;Help with stairs or ramp for entrance   Equipment Recommendations  Other  (comment);BSC/3in1 (transport chair)    Recommendations for Other Services OT consult     Precautions / Restrictions Precautions Precautions: Fall Precaution Comments: L2 compression fracture, with history of other chronic compression fractures Restrictions Weight Bearing Restrictions: No     Mobility  Bed Mobility Overal bed mobility: Needs Assistance Bed Mobility: Supine to Sit     Supine to sit: Mod assist, HOB elevated     General bed mobility comments: modA for trunk support with cueing for BLE sequencing and management off the EOB    Transfers Overall transfer level: Needs assistance Equipment used: Rolling walker (2 wheels) Transfers: Sit to/from Stand, Bed to chair/wheelchair/BSC Sit to Stand: Mod assist, From elevated surface Stand pivot transfers: Mod assist Step pivot transfers: Mod assist       General transfer comment: modA for sit>stand from elevated bed for power up and initial balance. MinA for pivoting but modA to correct 1 posterior LOB, modA required for stand>sit for controlled descent    Ambulation/Gait               General Gait Details: pt declining due to fatigue   Stairs             Wheelchair Mobility    Modified Rankin (Stroke Patients Only)       Balance Overall balance assessment: Needs assistance Sitting-balance support: Feet supported, Single extremity supported Sitting balance-Leahy Scale: Fair Sitting balance - Comments: stable balance with static sitting on EOB   Standing balance support: During functional activity, Bilateral upper extremity supported, Reliant on assistive device for balance Standing balance-Leahy Scale: Poor Standing balance comment: pt utilizing RW for all standing and pivoting mobility, relying on RW for support, static standing with minA and initial pivoting but modA  to correct for LOB                            Cognition Arousal/Alertness: Awake/alert Behavior During  Therapy: Agitated Overall Cognitive Status: Impaired/Different from baseline Area of Impairment: Following commands, Safety/judgement, Problem solving                       Following Commands: Follows one step commands with increased time Safety/Judgement: Decreased awareness of deficits   Problem Solving: Requires verbal cues, Decreased initiation, Difficulty sequencing General Comments: agreeable to session but requires increased encouragement to participate        Exercises      General Comments General comments (skin integrity, edema, etc.): pt on 5L O2 HFNC upon arrival, HR at 128, 89% SPO2 at rest. O2 increased to 6L, HR elevating to 140s and desatting to ~86% with mobility but recovers back to 120s and 90% with rest breaks. With stand pivot HR elevated briefly to 150s and SPO2 at 85%, but with seated rest break, pt resting at 120s HR and 94% remaining on 6L O2 HFNC, RN aware      Pertinent Vitals/Pain Pain Assessment Pain Assessment: Faces Faces Pain Scale: Hurts little more Pain Location: back and generalized Pain Descriptors / Indicators: Aching Pain Intervention(s): Monitored during session, Limited activity within patient's tolerance, Repositioned    Home Living                          Prior Function            PT Goals (current goals can now be found in the care plan section) Acute Rehab PT Goals Patient Stated Goal: go home PT Goal Formulation: With patient Time For Goal Achievement: 08/24/22 Potential to Achieve Goals: Good Progress towards PT goals: Progressing toward goals    Frequency    Min 3X/week      PT Plan Current plan remains appropriate    Co-evaluation              AM-PAC PT "6 Clicks" Mobility   Outcome Measure  Help needed turning from your back to your side while in a flat bed without using bedrails?: A Little Help needed moving from lying on your back to sitting on the side of a flat bed without using  bedrails?: A Lot Help needed moving to and from a bed to a chair (including a wheelchair)?: A Lot Help needed standing up from a chair using your arms (e.g., wheelchair or bedside chair)?: A Lot Help needed to walk in hospital room?: A Lot Help needed climbing 3-5 steps with a railing? : Total 6 Click Score: 12    End of Session Equipment Utilized During Treatment: Oxygen (6L O2 HFNC) Activity Tolerance: Patient limited by fatigue;Patient limited by pain Patient left: with call bell/phone within reach;with family/visitor present;in chair Nurse Communication: Mobility status PT Visit Diagnosis: History of falling (Z91.81);Muscle weakness (generalized) (M62.81);Other abnormalities of gait and mobility (R26.89)     Time: 4008-6761 PT Time Calculation (min) (ACUTE ONLY): 35 min  Charges:  $Therapeutic Activity: 23-37 mins                     Charlynne Cousins, PT DPT Acute Rehabilitation Services Office [phone]    Luvenia Heller 08/14/2022, 1:55 PM

## 2022-08-14 NOTE — TOC Progression Note (Signed)
Transition of Care [facility]) - Progression Note    Patient Details  Name: Francisco [name] MRN: [id number] Date of Birth: [birth year]  Transition of [facility] CM/SW [place], LCSW Phone Number: 08/14/2022, 9:27 AM  Clinical Narrative:    Insurance approval received for [employer]: Ref# [id number] ID# [id number], effective 08/14/2022-08/16/2022.   Expected Discharge Plan: [place] Barriers to Discharge: Continued Medical Work up  Expected Discharge Plan and Services In-house Referral: Clinical Social Work   Post Acute Care Choice: [place] Living arrangements for the past 2 months: Single Family Home                                       Social Determinants of Health (SDOH) Interventions SDOH Screenings   Depression (PHQ2-9): Low Risk  (08/09/2022)  Tobacco Use: Medium Risk (08/12/2022)    Readmission Risk Interventions    08/13/2022    1:46 PM 01/31/2020   10:35 AM  Readmission Risk Prevention Plan  Transportation Screening Complete Complete  PCP or Specialist Appt within 5-7 Days Complete Complete  Home Care Screening Complete Complete  Medication Review (RN CM) Complete Complete

## 2022-08-14 NOTE — Progress Notes (Signed)
PROGRESS NOTE                                                                                                                                                                                                             Patient Demographics:    Francisco [name], is a 77 y.o. male, DOB - 1945/08/22, JAS:[id number]  Outpatient Primary MD for the patient is [name], MD    LOS - 4  Admit date - 08/09/2022    Chief Complaint  Patient presents with   GI Bleeding       Brief Narrative (HPI from H&P)    77 y.o. male with medical history significant of COPD, venous stasis, PAD, anemia, hyperlipidemia, history of GI bleed with AVM, BPH, lung cancer on observation, essential tremor, hypertension, CHF, PE on Eliquis presenting with dark stools from PCP office.   Patient has had dark stools for the past 2 days.  Is on iron therapy but PCP felt he looked pale and sent him to the ED for further evaluation for possible GI bleed.  This is in the setting of history of known prior GI bleed and currently on Eliquis.  Patient additionally had a fall 2 weeks ago and has had some low back pain since that time.  Additionally is reporting some constipation.  In the ER he was found to have another L-spine compression fracture acute on chronic, of note he was due to be seen at [facility] in the next coming days, he was also found to be anemic and admitted to the hospital for pain control and anemia treatment.   Subjective:   Reports significant dyspnea, cough this morning and shortness of breath .   Assessment  & Plan :   Upper GI bleed with bleeding, with acute blood loss related anemia.   Has heme positive and melanotic stools, currently on IV PPI, H&H closely being monitored, type screen done patient is agreeable for transfusion if needed.  GI on board and underwent EGD on 08/12/2022 showing Dieulafoy lesion of stomach located in the  gastric fundus along with AVMs in duodenum, Spectre lesions were cauterized, continue to monitor CBC which is stable, commence Eliquis on 08/16/2022.  Advance activity PT OT.   Acute on chronic low back pain.   - Has history of L-spine fractures in the past, had  fall 2 weeks ago after which looks like he has a subacute L2 compression fracture as well.  Currently pain control, PT OT, seen by orthopedics here continue present medical treatment with LSO brace and pain control, PT OT, post discharge follow-up with Dr. [name] at [facility] in 1 week.  Severe underlying COPD with cor pulmonale and pulmonary hypertension.   Acute on chronic hypoxic respiratory failure COPD exacerbation He is on 4 to 5 L of oxygen at all times, currently also has evidence of mild fluid overload in the lungs, continue home nebulizer treatments, oxygen, encouraged to sit up in chair use I-S and flutter valve for pulmonary toiletry.  Will diurese on top of his lung medications and monitor. -This morning he has increased oxygen requirement to 6 L nasal cannula, with increased work of breathing, significant wheezing, cough with productive phlegm, he was encouraged to use incentive spirometry and flutter valve, will start on IV steroids, and IV Rocephin.  Acute on chronic diastolic CHF EF 35%.  Does have evidence of fluid overload continue gentle diuresis and monitor.  Has been placed on beta-blocker.  PAD.  No acute issues.  Continue statin for secondary prevention.  History of PE.   On Eliquis currently on hold due to GI bleed, this has been at least 2 years or more. -Can be resumed in 08/16/2022  Dyslipidemia.  On statin.  History of lung cancer.  Outpatient follow-up with his pulmonologist [name] and his oncologist.  Hypertension.  -Blood pressure has been soft, but after holding his Lopressor yesterday he is tachycardic today, he is started on scheduled midodrine to allow room for resuming his Lopressor.          Condition - Extremely Guarded  Family Communication  : Wife and son at bedside  Code Status :  DNR  Consults  :  GI, Ortho  PUD Prophylaxis : PPI   Procedures  :     EGD - Impression:               - Normal esophagus.                           - 3 cm hiatal hernia.                           - Hematin (altered blood/coffee-ground-like                            material) in the gastric fundus and in the gastric                            body.                           - Dieulafoy lesion of stomach located in the                            gastric fundus. This was treated with hemostatic                            clips x2 and epinephrine injection.                           -  Mild, non-ulcer gastritis. Biopsied.                           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 Treated with                            argon plasma coagulation (APC). Recommendation:           - Return patient to hospital ward for ongoing care.                           - Advance diet as tolerated.                           - Await pathology results.                           - Use Protonix (pantoprazole) 40 mg PO BID for 8                            weeks to promote mucosal healing, then reduce to 40                            mg daily. Resume PPI for prophylaxis if requiring                            ongoing anticoagulation.                           - If planning to restart anticoagulation, would                            wait at least 72 hours to allow for mucosal healing                            after endoscopic intervention today.  Echocardiogram.  1. Left ventricular ejection fraction, by estimation, is 55 to 60%. The left ventricle has normal function. The left ventricle has no regional wall motion abnormalities. Left ventricular diastolic function could not be evaluated.  2. Right ventricular systolic function is normal. The right ventricular size is normal.  3. A small pericardial  effusion is present.  4. No evidence of mitral valve regurgitation.  5. The aortic valve was not well visualized. Aortic valve regurgitation is not visualized.  6. Not well visualized.  7. The inferior vena cava is normal in size with greater than 50% respiratory variability, suggesting right atrial pressure of 3 mmHg.  CT scan abdomen pelvis.  1. Acute appearing compression fracture of L2 with mild retropulsion of fracture fragments. Please correlate clinically. 2. Left inguinal hernia containing colon has increased in size. No bowel obstruction. 3. Stable right inguinal hernia containing bladder. 4. 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 Aortic Atherosclerosis (ICD10-I70.0).  CT L-spine. - 1. Acute to subacute fracture deformity of the L2 vertebral body is new when compared with the MRI from 07/27/2022. Loss of approximately 50% of the central vertebral body height is identified. Mild retropulsion is identified along the posterior aspect of the superior endplate.  2. Chronic fracture deformity involving the L4 vertebral body is again noted with loss of approximately 70% of the central vertebral body height. 3. Age-indeterminate L3 inferior endplate compression deformity is unchanged when compared with the MRI from 07/27/22      Disposition Plan  :    Status is: Inpt  DVT Prophylaxis  :    apixaban (ELIQUIS) tablet 2.5 mg Start: 08/16/22 1000 Place and maintain sequential compression device Start: 08/10/22 0554 SCDs Start: 08/09/22 1844 apixaban (ELIQUIS) tablet 2.5 mg    Lab Results  Component Value Date   PLT 296 08/14/2022    Diet :  Diet Order             DIET SOFT Room service appropriate? Yes; Fluid consistency: Thin  Diet effective now                    Inpatient Medications  Scheduled Meds:  acetaminophen  1,000 mg Oral Q6H   [START ON 08/16/2022] apixaban  2.5 mg Oral BID   docusate sodium  200 mg Oral BID   ferrous sulfate  325 mg Oral QODAY   guaiFENesin   1,200 mg Oral BID   methylPREDNISolone (SOLU-MEDROL) injection  80 mg Intravenous Q12H   metoprolol tartrate  25 mg Oral BID   midodrine  10 mg Oral TID WC   mometasone-formoterol  2 puff Inhalation BID   pantoprazole  40 mg Oral BID   polyethylene glycol  17 g Oral Daily   predniSONE  10 mg Oral Q breakfast   roflumilast  500 mcg Oral Daily   rosuvastatin  10 mg Oral Daily   sodium chloride flush  3 mL Intravenous Q12H   umeclidinium bromide  1 puff Inhalation Daily   Continuous Infusions:  cefTRIAXone (ROCEPHIN)  IV 1 g (08/14/22 0945)   PRN Meds:.albuterol, bisacodyl, metoprolol tartrate, oxyCODONE  Antibiotics  :    Anti-infectives (From admission, onward)    Start     Dose/Rate Route Frequency Ordered Stop   08/14/22 1000  cefTRIAXone (ROCEPHIN) 1 g in sodium chloride 0.9 % 100 mL IVPB        1 g 200 mL/hr over 30 Minutes Intravenous Every 24 hours 08/14/22 0914           Objective:   Vitals:   08/14/22 0500 08/14/22 0800 08/14/22 0945 08/14/22 1200  BP:  108/77  (!) 109/51  Pulse:  (!) 125 (!) 127 90  Resp:  16 (!) 23 16  Temp:  98.4 F (36.9 C)  98.3 F (36.8 C)  TempSrc:  Axillary  Oral  SpO2:  96% 94% 94%  Weight: 87.9 kg     Height:        Wt Readings from Last 3 Encounters:  08/14/22 87.9 kg  06/06/22 82.6 kg  05/02/22 80.7 kg     Intake/Output Summary (Last 24 hours) at 08/14/2022 1359 Last data filed at 08/14/2022 1306 Gross per 24 hour  Intake 100 ml  Output --  Net 100 ml     Physical Exam    Awake Alert, Oriented X 3, extremely frail, mildly dyspneic, mildly confused Symmetrical Chest wall movement, diffuse wheezing bilaterally RRR,No Gallops,Rubs or new Murmurs, No Parasternal Heave +ve B.Sounds, Abd Soft, No tenderness, No rebound - guarding or rigidity. No Cyanosis, Clubbing, +1 edema right more than left, with difficultly dry skin and lower extremity       Data Review:    Recent Labs  Lab 08/09/22 1330  08/10/22 0322  08/11/22 0531 08/11/22 2012 08/12/22 0226 08/13/22 0522 08/14/22 0617  WBC 10.7*   < > 9.6 14.1* 11.5* 9.4 9.5  HGB 11.0*   < > 9.7* 10.3* 10.6* 10.2* 11.0*  HCT 34.7*   < > 29.4* 31.8* 32.7* 32.1* 34.3*  PLT 307   < > 222 230 233 250 296  MCV 99.4   < > 96.1 97.5 98.8 98.8 98.8  MCH 31.5   < > 31.7 31.6 32.0 31.4 31.7  MCHC 31.7   < > 33.0 32.4 32.4 31.8 32.1  RDW 13.2   < > 13.2 13.1 13.1 13.2 13.2  LYMPHSABS 0.5*  --  0.7  --  0.9 1.0 1.0  MONOABS 0.5  --  1.0  --  1.2* 1.0 1.1*  EOSABS 0.0  --  0.3  --  0.2 0.4 0.4  BASOSABS 0.1  --  0.0  --  0.1 0.1 0.1   < > = values in this interval not displayed.    Recent Labs  Lab 08/09/22 1330 08/10/22 0322 08/10/22 1123 08/11/22 0531 08/12/22 0226 08/13/22 0522 08/14/22 0617  NA 139 138  --  138 134* 136 139  K 4.7 3.9  --  4.1 4.9 4.8 4.1  CL 99 100  --  102 96* 96* 93*  CO2 28 27  --  29 29 31  34*  ANIONGAP 12 11  --  7 9 9 12   GLUCOSE 108* 80  --  95 103* 98 92  BUN 12 11  --  16 14 16 15   CREATININE 1.12 1.15  --  1.16 1.10 1.15 1.25*  AST 23 19  --   --   --   --   --   ALT 18 16  --   --   --   --   --   ALKPHOS 90 84  --   --   --   --   --   BILITOT 0.7 0.7  --   --   --   --   --   ALBUMIN 3.2* 2.9*  --   --   --   --   --   INR 1.2  --   --   --   --   --   --   TSH  --   --  2.962  --   --   --   --   BNP  --   --   --  104.9* 147.0* 144.5* 143.2*  MG  --   --  1.9 1.9 2.3 2.1 2.1  CALCIUM 9.2 8.7*  --  8.4* 8.6* 8.6* 8.7*      Radiology Reports ECHOCARDIOGRAM COMPLETE  Result Date: 08/10/2022    ECHOCARDIOGRAM REPORT   Patient Name:   ARTHOR GORTER II Date of Exam: 08/10/2022 Medical Rec #:  [id number]         Height:       72.0 in Accession #:    [id number]        Weight:       195.1 lb Date of Birth:  30-Nov-1945         BSA:          2.108 m Patient Age:    83 years          BP:           151/78 mmHg Patient Gender: M  HR:           116 bpm. Exam Location:  Inpatient Procedure: 2D Echo, Color  Doppler, Cardiac Doppler and Intracardiac            Opacification Agent Indications:    CHF - Acute Diastolic  History:        Patient has prior history of Echocardiogram examinations, most                 recent 11/04/2017. CHF, PAD and COPD; Signs/Symptoms:Hypotension,                 Edema and Dyspnea. Lung cancer, pulmonary embolism.  Sonographer:    Eartha Inch Referring Phys: Graylin Shiver [facility]  Sonographer Comments: Technically difficult study due to poor echo windows. Image acquisition challenging due to patient body habitus and Image acquisition challenging due to respiratory motion. IMPRESSIONS  1. Left ventricular ejection fraction, by estimation, is 55 to 60%. The left ventricle has normal function. The left ventricle has no regional wall motion abnormalities. Left ventricular diastolic function could not be evaluated.  2. Right ventricular systolic function is normal. The right ventricular size is normal.  3. A small pericardial effusion is present.  4. No evidence of mitral valve regurgitation.  5. The aortic valve was not well visualized. Aortic valve regurgitation is not visualized.  6. Not well visualized.  7. The inferior vena cava is normal in size with greater than 50% respiratory variability, suggesting right atrial pressure of 3 mmHg. FINDINGS  Left Ventricle: Left ventricular ejection fraction, by estimation, is 55 to 60%. The left ventricle has normal function. The left ventricle has no regional wall motion abnormalities. Definity contrast agent was given IV to delineate the left ventricular  endocardial borders. The left ventricular internal cavity size was normal in size. There is no left ventricular hypertrophy. Left ventricular diastolic function could not be evaluated. Right Ventricle: The right ventricular size is normal. Right ventricular systolic function is normal. Left Atrium: Left atrial size was normal in size. Right Atrium: Right atrial size was normal in size.  Pericardium: A small pericardial effusion is present. Mitral Valve: No evidence of mitral valve regurgitation. Tricuspid Valve: Tricuspid valve regurgitation is not demonstrated. Aortic Valve: The aortic valve was not well visualized. Aortic valve regurgitation is not visualized. Pulmonic Valve: Pulmonic valve regurgitation is not visualized. Aorta: Not well visualized. Venous: The inferior vena cava is normal in size with greater than 50% respiratory variability, suggesting right atrial pressure of 3 mmHg. IAS/Shunts: The interatrial septum was not well visualized.  LEFT VENTRICLE PLAX 2D LVIDd:         4.00 cm LVIDs:         3.10 cm LV PW:         1.10 cm LV IVS:        0.90 cm  LEFT ATRIUM           Index LA diam:      3.50 cm 1.66 cm/m LA Vol (A4C): 25.1 ml 11.91 ml/m Phineas Inches Electronically signed by Phineas Inches Signature Date/Time: 08/10/2022/5:09:21 PM    Final       Signature  -   Phillips Climes M.D on 08/14/2022 at 1:59 PM   -  To page go to [email or web address]

## 2022-08-14 NOTE — Progress Notes (Signed)
Pt refused dulera & incruse. Pt has home meds Symbicort & Spiriva. Those two give by RT.

## 2022-08-14 NOTE — Progress Notes (Signed)
  X-cover Note: RN called that pt is lethargic. Fentanyl patch started 08-10-2021. Pt had not been on fentanyl patch before. Told RN to remove fentanyl patch.  Kristopher Oppenheim, DO [facility]

## 2022-08-14 NOTE — Progress Notes (Signed)
Pt more lethargic this morning, sounds more congested with rhonchi.  Called on call MD, Dr. Kristopher Oppenheim and he d/ced Fentanyl patch.  Fentanyl patch has been removed.

## 2022-08-15 DIAGNOSIS — J9611 Chronic respiratory failure with hypoxia: Secondary | ICD-10-CM | POA: Diagnosis not present

## 2022-08-15 DIAGNOSIS — K31811 Angiodysplasia of stomach and duodenum with bleeding: Secondary | ICD-10-CM

## 2022-08-15 DIAGNOSIS — J441 Chronic obstructive pulmonary disease with (acute) exacerbation: Secondary | ICD-10-CM | POA: Diagnosis not present

## 2022-08-15 LAB — CBC WITH DIFFERENTIAL/PLATELET
Abs Immature Granulocytes: 0.06 10*3/uL (ref 0.00–0.07)
Basophils Absolute: 0 10*3/uL (ref 0.0–0.1)
Basophils Relative: 0 %
Eosinophils Absolute: 0 10*3/uL (ref 0.0–0.5)
Eosinophils Relative: 0 %
HCT: 31.1 % — ABNORMAL LOW (ref 39.0–52.0)
Hemoglobin: 9.8 g/dL — ABNORMAL LOW (ref 13.0–17.0)
Immature Granulocytes: 2 %
Lymphocytes Relative: 6 %
Lymphs Abs: 0.3 10*3/uL — ABNORMAL LOW (ref 0.7–4.0)
MCH: 31.3 pg (ref 26.0–34.0)
MCHC: 31.5 g/dL (ref 30.0–36.0)
MCV: 99.4 fL (ref 80.0–100.0)
Monocytes Absolute: 0.1 10*3/uL (ref 0.1–1.0)
Monocytes Relative: 3 %
Neutro Abs: 3.4 10*3/uL (ref 1.7–7.7)
Neutrophils Relative %: 89 %
Platelets: 216 10*3/uL (ref 150–400)
RBC: 3.13 MIL/uL — ABNORMAL LOW (ref 4.22–5.81)
RDW: 12.8 % (ref 11.5–15.5)
WBC: 3.9 10*3/uL — ABNORMAL LOW (ref 4.0–10.5)
nRBC: 0 % (ref 0.0–0.2)

## 2022-08-15 LAB — BASIC METABOLIC PANEL
Anion gap: 9 (ref 5–15)
BUN: 18 mg/dL (ref 8–23)
CO2: 34 mmol/L — ABNORMAL HIGH (ref 22–32)
Calcium: 8.5 mg/dL — ABNORMAL LOW (ref 8.9–10.3)
Chloride: 93 mmol/L — ABNORMAL LOW (ref 98–111)
Creatinine, Ser: 1.05 mg/dL (ref 0.61–1.24)
GFR, Estimated: >60 mL/min (ref >60)
Glucose, Bld: 141 mg/dL — ABNORMAL HIGH (ref 70–99)
Potassium: 5 mmol/L (ref 3.5–5.1)
Sodium: 136 mmol/L (ref 135–145)

## 2022-08-15 NOTE — Progress Notes (Signed)
PROGRESS NOTE                                                                                                                                                                                                             Patient Demographics:    Francisco [name], is a 77 y.o. male, DOB - Aug 29, 1945, EXN:[id number]  Outpatient Primary MD for the patient is [name], MD    LOS - 5  Admit date - 08/09/2022    Chief Complaint  Patient presents with   GI Bleeding       Brief Narrative (HPI from H&P)    77 y.o. male with medical history significant of COPD, venous stasis, PAD, anemia, hyperlipidemia, history of GI bleed with AVM, BPH, lung cancer on observation, essential tremor, hypertension, CHF, PE on Eliquis presenting with dark stools from PCP office.   Patient has had dark stools for the past 2 days.  Is on iron therapy but PCP felt he looked pale and sent him to the ED for further evaluation for possible GI bleed.  This is in the setting of history of known prior GI bleed and currently on Eliquis.  Patient additionally had a fall 2 weeks ago and has had some low back pain since that time.  Additionally is reporting some constipation.  In the ER he was found to have another L-spine compression fracture acute on chronic, of note he was due to be seen at [facility] in the next coming days, he was also found to be anemic and admitted to the hospital for pain control and anemia treatment.   Subjective:   Report dyspnea has improved, reports he had a better night sleep, still reports significantly productive cough.  Wife at bedside report BM this morning with dark color stool( from old bleed, no fresh blood)   Assessment  & Plan :   Upper GI bleed with bleeding, with acute blood loss related anemia.   Has heme positive and melanotic stools, currently on IV PPI, H&H closely being monitored, type screen done patient is  agreeable for transfusion if needed.  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  Advance  activity PT OT.   Acute on chronic low back pain.   - Has history of L-spine fractures in the past, had fall 2 weeks ago after which looks like he has a subacute L2 compression fracture as well.  Currently pain control, PT OT, seen by orthopedics here continue present medical treatment with LSO brace and pain control, PT OT, post discharge follow-up with Dr. [name] at [facility] in 1 week.  Severe underlying COPD with cor pulmonale and pulmonary hypertension.   Acute on chronic hypoxic respiratory failure COPD exacerbation He is on 4 to 5 L of oxygen at all times, currently also has evidence of mild fluid overload in the lungs, continue home nebulizer treatments, oxygen, encouraged to sit up in chair use I-S and flutter valve for pulmonary toiletry.  Will diurese on top of his lung medications and monitor. -Continue with IV steroids . -Continue with IV Rocephin . -Continue with scheduled DuoNebs . -He is compliant with his incentive spirometer and flutter valve, but he remains with copious productive phlegm, discussed with RT, plan for chest PT with vest today, continue with Mucinex .  Acute on chronic diastolic CHF EF 38%.   Does have evidence of fluid overload continue gentle diuresis and monitor.  Has been placed on beta-blocker. -Blood pressure remains soft, will hold on diuresis for now  PAD.  No acute issues.  Continue statin for secondary prevention.  History of PE.   On Eliquis currently on hold due to GI bleed -Can be resumed in 08/16/2022  Dyslipidemia.  On statin.  History of lung cancer.  Outpatient follow-up with his pulmonologist [name] and his oncologist.  Hypertension.>>   Hypotension -Blood pressure has been soft, but after holding his Lopressor came with sinus tachycardia, so he is started on scheduled midodrine to allow room to give his metoprolol       Condition - Extremely Guarded  Family Communication  : Wife at bedside  Code Status :  DNR  Consults  :  GI, Ortho  PUD Prophylaxis : PPI   Procedures  :     EGD - Impression:               - Normal esophagus.                           - 3 cm hiatal hernia.                           - Hematin (altered blood/coffee-ground-like                            material) in the gastric fundus and in the gastric                            body.                           - Dieulafoy lesion of stomach located in the                            gastric fundus. This was treated with hemostatic  clips x2 and epinephrine injection.                           - Mild, non-ulcer gastritis. Biopsied.                           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 Treated with                            argon plasma coagulation (APC). Recommendation:           - Return patient to hospital ward for ongoing care.                           - Advance diet as tolerated.                           - Await pathology results.                           - Use Protonix (pantoprazole) 40 mg PO BID for 8                            weeks to promote mucosal healing, then reduce to 40                            mg daily. Resume PPI for prophylaxis if requiring                            ongoing anticoagulation.                           - If planning to restart anticoagulation, would                            wait at least 72 hours to allow for mucosal healing                            after endoscopic intervention today.  Echocardiogram.  1. Left ventricular ejection fraction, by estimation, is 55 to 60%. The left ventricle has normal function. The left ventricle has no regional wall motion abnormalities.  Left ventricular diastolic function could not be evaluated.  2. Right ventricular systolic function is normal. The right ventricular size is normal.  3. A small pericardial effusion is present.  4. No evidence of mitral valve regurgitation.  5. The aortic valve was not well visualized. Aortic valve regurgitation is not visualized.  6. Not well visualized.  7. The inferior vena cava is normal in size with greater than 50% respiratory variability, suggesting right atrial pressure of 3 mmHg.  CT scan abdomen pelvis.  1. Acute appearing compression fracture of L2 with mild retropulsion of fracture fragments. Please correlate clinically. 2. Left inguinal hernia containing colon has increased in size. No bowel obstruction. 3. Stable right inguinal hernia containing bladder. 4. 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 Aortic Atherosclerosis (ICD10-I70.0).  CT L-spine. - 1. Acute to subacute fracture deformity of the L2 vertebral body is  new when compared with the MRI from 07/27/2022. Loss of approximately 50% of the central vertebral body height is identified. Mild retropulsion is identified along the posterior aspect of the superior endplate. 2. Chronic fracture deformity involving the L4 vertebral body is again noted with loss of approximately 70% of the central vertebral body height. 3. Age-indeterminate L3 inferior endplate compression deformity is unchanged when compared with the MRI from 07/27/22      Disposition Plan  :    Status is: Inpt  DVT Prophylaxis  :    apixaban (ELIQUIS) tablet 2.5 mg Start: 08/16/22 1000 Place and maintain sequential compression device Start: 08/10/22 0554 SCDs Start: 08/09/22 1844 apixaban (ELIQUIS) tablet 2.5 mg    Lab Results  Component Value Date   PLT 216 08/15/2022    Diet :  Diet Order             Diet Heart Room service appropriate? Yes; Fluid consistency: Thin  Diet effective now                    Inpatient Medications  Scheduled  Meds:  acetaminophen  1,000 mg Oral Q6H   [START ON 08/16/2022] apixaban  2.5 mg Oral BID   docusate sodium  200 mg Oral BID   ferrous sulfate  325 mg Oral QODAY   guaiFENesin  1,200 mg Oral BID   methylPREDNISolone (SOLU-MEDROL) injection  80 mg Intravenous Q12H   metoprolol tartrate  25 mg Oral BID   midodrine  10 mg Oral TID WC   mometasone-formoterol  2 puff Inhalation BID   pantoprazole  40 mg Oral BID   polyethylene glycol  17 g Oral Daily   predniSONE  10 mg Oral Q breakfast   roflumilast  500 mcg Oral Daily   rosuvastatin  10 mg Oral Daily   sodium chloride flush  3 mL Intravenous Q12H   umeclidinium bromide  1 puff Inhalation Daily   Continuous Infusions:  cefTRIAXone (ROCEPHIN)  IV 1 g (08/15/22 0957)   PRN Meds:.albuterol, bisacodyl, metoprolol tartrate, oxyCODONE  Antibiotics  :    Anti-infectives (From admission, onward)    Start     Dose/Rate Route Frequency Ordered Stop   08/14/22 1000  cefTRIAXone (ROCEPHIN) 1 g in sodium chloride 0.9 % 100 mL IVPB        1 g 200 mL/hr over 30 Minutes Intravenous Every 24 hours 08/14/22 0914           Objective:   Vitals:   08/14/22 2311 08/15/22 0335 08/15/22 0443 08/15/22 0804  BP: (!) 145/64 (!) 151/72  107/67  Pulse: 93 70  89  Resp: 19 13    Temp: 98.2 F (36.8 C) 98.1 F (36.7 C)  (!) 97.4 F (36.3 C)  TempSrc: Oral Oral  Oral  SpO2: 94% 99%    Weight:   88.4 kg   Height:        Wt Readings from Last 3 Encounters:  08/15/22 88.4 kg  06/06/22 82.6 kg  05/02/22 80.7 kg     Intake/Output Summary (Last 24 hours) at 08/15/2022 1210 Last data filed at 08/15/2022 0100 Gross per 24 hour  Intake 660 ml  Output --  Net 660 ml     Physical Exam   Awake Alert, Oriented X 3, tremulous frail, appears more comfortable today Symmetrical Chest wall movement, Good air movement bilaterally, wheezing much improved RRR,No Gallops,Rubs or new Murmurs, No Parasternal Heave +ve B.Sounds, Abd Soft, No tenderness, No  rebound - guarding or rigidity. No Cyanosis, Clubbing, +1 edema, with significantly dry skin a       Data Review:    Recent Labs  Lab 08/11/22 0531 08/11/22 2012 08/12/22 0226 08/13/22 0522 08/14/22 0617 08/15/22 0237  WBC 9.6 14.1* 11.5* 9.4 9.5 3.9*  HGB 9.7* 10.3* 10.6* 10.2* 11.0* 9.8*  HCT 29.4* 31.8* 32.7* 32.1* 34.3* 31.1*  PLT 222 230 233 250 296 216  MCV 96.1 97.5 98.8 98.8 98.8 99.4  MCH 31.7 31.6 32.0 31.4 31.7 31.3  MCHC 33.0 32.4 32.4 31.8 32.1 31.5  RDW 13.2 13.1 13.1 13.2 13.2 12.8  LYMPHSABS 0.7  --  0.9 1.0 1.0 0.3*  MONOABS 1.0  --  1.2* 1.0 1.1* 0.1  EOSABS 0.3  --  0.2 0.4 0.4 0.0  BASOSABS 0.0  --  0.1 0.1 0.1 0.0    Recent Labs  Lab 08/09/22 1330 08/10/22 0322 08/10/22 1123 08/11/22 0531 08/12/22 0226 08/13/22 0522 08/14/22 0617 08/15/22 0237  NA 139 138  --  138 134* 136 139 136  K 4.7 3.9  --  4.1 4.9 4.8 4.1 5.0  CL 99 100  --  102 96* 96* 93* 93*  CO2 28 27  --  29 29 31  34* 34*  ANIONGAP 12 11  --  7 9 9 12 9   GLUCOSE 108* 80  --  95 103* 98 92 141*  BUN 12 11  --  16 14 16 15 18   CREATININE 1.12 1.15  --  1.16 1.10 1.15 1.25* 1.05  AST 23 19  --   --   --   --   --   --   ALT 18 16  --   --   --   --   --   --   ALKPHOS 90 84  --   --   --   --   --   --   BILITOT 0.7 0.7  --   --   --   --   --   --   ALBUMIN 3.2* 2.9*  --   --   --   --   --   --   INR 1.2  --   --   --   --   --   --   --   TSH  --   --  2.962  --   --   --   --   --   BNP  --   --   --  104.9* 147.0* 144.5* 143.2*  --   MG  --   --  1.9 1.9 2.3 2.1 2.1  --   CALCIUM 9.2 8.7*  --  8.4* 8.6* 8.6* 8.7* 8.5*      Radiology Reports No results found.    Signature  -   Phillips Climes M.D on 08/15/2022 at 12:10 PM   -  To page go to [email or web address]

## 2022-08-16 ENCOUNTER — Encounter (HOSPITAL_COMMUNITY): Payer: Self-pay | Admitting: Gastroenterology

## 2022-08-16 LAB — CBC
HCT: 30.7 % — ABNORMAL LOW (ref 39.0–52.0)
Hemoglobin: 9.8 g/dL — ABNORMAL LOW (ref 13.0–17.0)
MCH: 31.4 pg (ref 26.0–34.0)
MCHC: 31.9 g/dL (ref 30.0–36.0)
MCV: 98.4 fL (ref 80.0–100.0)
Platelets: 206 10*3/uL (ref 150–400)
RBC: 3.12 MIL/uL — ABNORMAL LOW (ref 4.22–5.81)
RDW: 12.7 % (ref 11.5–15.5)
WBC: 6.4 10*3/uL (ref 4.0–10.5)
nRBC: 0 % (ref 0.0–0.2)

## 2022-08-16 LAB — BASIC METABOLIC PANEL
Anion gap: 9 (ref 5–15)
BUN: 21 mg/dL (ref 8–23)
CO2: 34 mmol/L — ABNORMAL HIGH (ref 22–32)
Calcium: 8.5 mg/dL — ABNORMAL LOW (ref 8.9–10.3)
Chloride: 93 mmol/L — ABNORMAL LOW (ref 98–111)
Creatinine, Ser: 1.18 mg/dL (ref 0.61–1.24)
GFR, Estimated: >60 mL/min (ref >60)
Glucose, Bld: 118 mg/dL — ABNORMAL HIGH (ref 70–99)
Potassium: 4.9 mmol/L (ref 3.5–5.1)
Sodium: 136 mmol/L (ref 135–145)

## 2022-08-16 MED ORDER — PREDNISONE 5 MG PO TABS
50.0000 mg | ORAL_TABLET | Freq: Every day | ORAL | Status: DC
  Administered 2022-08-16 – 2022-08-17 (×2): 50 mg via ORAL
  Filled 2022-08-16 (×2): qty 2

## 2022-08-16 MED ORDER — FUROSEMIDE 20 MG PO TABS
20.0000 mg | ORAL_TABLET | Freq: Every day | ORAL | Status: DC
  Administered 2022-08-16: 20 mg via ORAL
  Filled 2022-08-16 (×2): qty 1

## 2022-08-16 NOTE — Progress Notes (Signed)
Physical Therapy Treatment Patient Details Name: Francisco [name] MRN: [id number] DOB: 09-03-1945 Today's Date: 08/16/2022   History of Present Illness 77 y/o M admitted to [facility] on 2/2 from PCP office for dark stools and GI bleed. Pt had a fall ~2 weeks ago, reporting LBP. Imaging reveals L2 compression fracture, managing through medication, no need for brace at this time per ortho. PMHx: COPD, venous stasis, PAD, anemia, HLD, history of GI bleed with AVM, BPH, lung cancer on observation, essential tremor, HTN, CHF, PE on Eliquis.    PT Comments    Pt tolerated today's session well, motivated to work with therapy and overall tolerating mobility better with regards to pain and fatigue. Pt has shown significant progress, performing bed mobility with minA, ambulating and performing transfers with minG and RW today. Pt ambulating 2 trials with a seated rest break between trials, noted desat to ~84% during second trial but recovering with a seated rest break back to low 90s on 4L O2 [place]. Pt and his spouse both pleased with progression thus far. Pt continues to benefit from skilled acute PT at this time to progress mobility back to baseline, discharge plan remains appropriate.     Recommendations for follow up therapy are one component of a multi-disciplinary discharge planning process, led by the attending physician.  Recommendations may be updated based on patient status, additional functional criteria and insurance authorization.  Follow Up Recommendations  Skilled nursing-short term rehab (<3 hours/day) Can patient physically be transported by private vehicle: No   Assistance Recommended at Discharge Frequent or constant Supervision/Assistance  Patient can return home with the following A little help with walking and/or transfers;A little help with bathing/dressing/bathroom;Assistance with cooking/housework;Assist for transportation;Help with stairs or ramp for entrance   Equipment Recommendations   Other (comment);BSC/3in1 (transport chair)    Recommendations for Other Services       Precautions / Restrictions Precautions Precautions: Fall Precaution Comments: L2 compression fracture, with history of other chronic compression fractures Restrictions Weight Bearing Restrictions: No     Mobility  Bed Mobility Overal bed mobility: Needs Assistance Bed Mobility: Supine to Sit     Supine to sit: Min assist     General bed mobility comments: minA for pulling up for trunk    Transfers Overall transfer level: Needs assistance Equipment used: Rolling walker (2 wheels) Transfers: Sit to/from Stand, Bed to chair/wheelchair/BSC Sit to Stand: Min guard           General transfer comment: minG for safety and balance with standing, cueing for proper hand placement    Ambulation/Gait Ambulation/Gait assistance: Min guard Gait Distance (Feet): 20 Feet (x5 feet first trial) Assistive device: Rolling walker (2 wheels) Gait Pattern/deviations: Step-through pattern, Decreased stride length, Trunk flexed, Shuffle Gait velocity: decreased     General Gait Details: downward gaze with decreased gait speed and shuffled steps, minG for safety and balance as well as Engineering geologist    Modified Rankin (Stroke Patients Only)       Balance Overall balance assessment: Needs assistance Sitting-balance support: Feet supported, Bilateral upper extremity supported Sitting balance-Leahy Scale: Fair Sitting balance - Comments: stable balance with static sitting on EOB   Standing balance support: During functional activity, Bilateral upper extremity supported, Reliant on assistive device for balance Standing balance-Leahy Scale: Poor Standing balance comment: pt relying on external support for balance during mobility  Cognition Arousal/Alertness: Awake/alert Behavior During Therapy: WFL for tasks  assessed/performed Overall Cognitive Status: Impaired/Different from baseline Area of Impairment: Following commands, Safety/judgement, Problem solving                       Following Commands: Follows one step commands with increased time Safety/Judgement: Decreased awareness of deficits   Problem Solving: Requires verbal cues, Difficulty sequencing General Comments: pt pleasant and agreeable to session        Exercises      General Comments General comments (skin integrity, edema, etc.): desatting to ~84% with longer ambulation trial on 4L O2 [place], recovers with seated rest break      Pertinent Vitals/Pain Pain Assessment Pain Assessment: Faces Faces Pain Scale: Hurts a little bit Pain Location: back and generalized Pain Descriptors / Indicators: Aching Pain Intervention(s): Monitored during session, Repositioned    Home Living                          Prior Function            PT Goals (current goals can now be found in the care plan section) Acute Rehab PT Goals Patient Stated Goal: go home PT Goal Formulation: With patient Time For Goal Achievement: 08/24/22 Potential to Achieve Goals: Good Progress towards PT goals: Progressing toward goals    Frequency    Min 3X/week      PT Plan Current plan remains appropriate    Co-evaluation              AM-PAC PT "6 Clicks" Mobility   Outcome Measure  Help needed turning from your back to your side while in a flat bed without using bedrails?: A Little Help needed moving from lying on your back to sitting on the side of a flat bed without using bedrails?: A Little Help needed moving to and from a bed to a chair (including a wheelchair)?: A Little Help needed standing up from a chair using your arms (e.g., wheelchair or bedside chair)?: A Little Help needed to walk in hospital room?: A Little Help needed climbing 3-5 steps with a railing? : Total 6 Click Score: 16    End of Session  Equipment Utilized During Treatment: Oxygen (4L O2 HFNC) Activity Tolerance: Patient tolerated treatment well Patient left: with call bell/phone within reach;with family/visitor present;in chair Nurse Communication: Mobility status PT Visit Diagnosis: History of falling (Z91.81);Muscle weakness (generalized) (M62.81);Other abnormalities of gait and mobility (R26.89)     Time: 8756-4332 PT Time Calculation (min) (ACUTE ONLY): 24 min  Charges:  $Gait Training: 8-22 mins $Therapeutic Activity: 8-22 mins                     Charlynne Cousins, PT DPT Acute Rehabilitation Services Office [phone]    Luvenia Heller 08/16/2022, 4:18 PM

## 2022-08-16 NOTE — Progress Notes (Signed)
PROGRESS NOTE                                                                                                                                                                                                             Patient Demographics:    Francisco [name], is a 77 y.o. male, DOB - [birth year], BFX:[id number]  Outpatient Primary MD for the patient is [name], MD    LOS - 6  Admit date - 08/09/2022    Chief Complaint  Patient presents with   GI Bleeding       Brief Narrative (HPI from H&P)    77 y.o. male with medical history significant of COPD, venous stasis, PAD, anemia, hyperlipidemia, history of GI bleed with AVM, BPH, lung cancer on observation, essential tremor, hypertension, CHF, PE on Eliquis presenting with dark stools from PCP office.   Patient has had dark stools for the past 2 days.  Is on iron therapy but PCP felt he looked pale and sent him to the ED for further evaluation for possible GI bleed.  This is in the setting of history of known prior GI bleed and currently on Eliquis.  Patient additionally had a fall 2 weeks ago and has had some low back pain since that time.  Additionally is reporting some constipation.  In the ER he was found to have another L-spine compression fracture acute on chronic, of note he was due to be seen at [facility] in the next coming days, he was also found to be anemic and admitted to the hospital for pain control and anemia treatment.   Subjective:   Report dyspnea has improved, tolerating sitting on the chair for few hours today, BM yesterday still with dark-colored stool, no fresh blood.      Assessment  & Plan :   Upper GI bleed with bleeding, with acute blood loss related anemia.   Has heme positive and melanotic stools, currently on IV PPI, H&H closely being monitored, type screen done patient is agreeable for transfusion if needed.  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  Advance activity PT OT.   Acute on chronic low  back pain.   - Has history of L-spine fractures in the past, had fall 2 weeks ago after which looks like he has a subacute L2 compression fracture as well.  Currently pain control, PT OT, seen by orthopedics here continue present medical treatment with LSO brace and pain control, PT OT, post discharge follow-up with Dr. [name] at [facility] in 1 week.  Severe underlying COPD with cor pulmonale and pulmonary hypertension.   Acute on chronic hypoxic respiratory failure COPD exacerbation He is on 4 to 5 L of oxygen at all times, currently also has evidence of mild fluid overload in the lungs, continue home nebulizer treatments, oxygen, encouraged to sit up in chair use I-S and flutter valve for pulmonary toiletry.  Will diurese on top of his lung medications and monitor. -Continue with IV steroids .  Patient to p.o. prednisone today, patient will need to be continued on his baseline prednisone 10 mg oral daily after his taper. -Continue with IV Rocephin . -Continue with scheduled DuoNebs . -He is compliant with his incentive spirometer and flutter valve, and has been improving with chest PT. -Continue with Mucinex   Acute on chronic diastolic CHF EF 16%.   Does have evidence of fluid overload continue gentle diuresis and monitor.  Has been placed on beta-blocker. -Blood pressure is improving, will resume on low-dose Lasix.  PAD.  No acute issues.  Continue statin for secondary prevention.  History of PE.   - Eliquis was held initially for GI bleed, resume today, monitor CBC tomorrow  Dyslipidemia.  On statin.  History of lung cancer.  Outpatient follow-up with his pulmonologist [name] and his oncologist.  Hypertension.>>   Hypotension -continue with metoprolol, DC midodrine       Condition - Extremely Guarded  Family Communication  : Wife at bedside  Code Status :  DNR  Consults  :  GI, Ortho  PUD Prophylaxis : PPI   Procedures  :     EGD - Impression:               - Normal esophagus.                           - 3 cm hiatal hernia.                           - Hematin (altered blood/coffee-ground-like                            material) in the gastric fundus and in the gastric                            body.                           - Dieulafoy lesion of stomach located in the                            gastric fundus. This was treated with hemostatic                            clips x2 and epinephrine injection.                           -  Mild, non-ulcer gastritis. Biopsied.                           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 Treated with                            argon plasma coagulation (APC). Recommendation:           - Return patient to hospital ward for ongoing care.                           - Advance diet as tolerated.                           - Await pathology results.                           - Use Protonix (pantoprazole) 40 mg PO BID for 8                            weeks to promote mucosal healing, then reduce to 40                            mg daily. Resume PPI for prophylaxis if requiring                            ongoing anticoagulation.                           - If planning to restart anticoagulation, would                            wait at least 72 hours to allow for mucosal healing                            after endoscopic intervention today.  Echocardiogram.  1. Left ventricular ejection fraction, by estimation, is 55 to 60%. The left ventricle has normal function. The left ventricle has no regional wall motion abnormalities. Left ventricular diastolic function could not be evaluated.  2. Right ventricular systolic function is normal. The right  ventricular size is normal.  3. A small pericardial effusion is present.  4. No evidence of mitral valve regurgitation.  5. The aortic valve was not well visualized. Aortic valve regurgitation is not visualized.  6. Not well visualized.  7. The inferior vena cava is normal in size with greater than 50% respiratory variability, suggesting right atrial pressure of 3 mmHg.  CT scan abdomen pelvis.  1. Acute appearing compression fracture of L2 with mild retropulsion of fracture fragments. Please correlate clinically. 2. Left inguinal hernia containing colon has increased in size. No bowel obstruction. 3. Stable right inguinal hernia containing bladder. 4. 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 Aortic Atherosclerosis (ICD10-I70.0).  CT L-spine. - 1. Acute to subacute fracture deformity of the L2 vertebral body is new when compared with the MRI from 07/27/2022. Loss of approximately 50% of the central vertebral body height is identified. Mild retropulsion is identified along the posterior aspect of the superior endplate.  2. Chronic fracture deformity involving the L4 vertebral body is again noted with loss of approximately 70% of the central vertebral body height. 3. Age-indeterminate L3 inferior endplate compression deformity is unchanged when compared with the MRI from 07/27/22      Disposition Plan  :    Status is: Inpt  DVT Prophylaxis  :    apixaban (ELIQUIS) tablet 2.5 mg Start: 08/16/22 1000 Place and maintain sequential compression device Start: 08/10/22 0554 SCDs Start: 08/09/22 1844 apixaban (ELIQUIS) tablet 2.5 mg    Lab Results  Component Value Date   PLT 206 08/16/2022    Diet :  Diet Order             Diet Heart Room service appropriate? Yes; Fluid consistency: Thin  Diet effective now                    Inpatient Medications  Scheduled Meds:  acetaminophen  1,000 mg Oral Q6H   apixaban  2.5 mg Oral BID   docusate sodium  200 mg Oral BID   ferrous sulfate   325 mg Oral QODAY   guaiFENesin  1,200 mg Oral BID   metoprolol tartrate  25 mg Oral BID   mometasone-formoterol  2 puff Inhalation BID   pantoprazole  40 mg Oral BID   polyethylene glycol  17 g Oral Daily   predniSONE  50 mg Oral Q breakfast   roflumilast  500 mcg Oral Daily   rosuvastatin  10 mg Oral Daily   sodium chloride flush  3 mL Intravenous Q12H   umeclidinium bromide  1 puff Inhalation Daily   Continuous Infusions:  cefTRIAXone (ROCEPHIN)  IV 1 g (08/16/22 0908)   PRN Meds:.albuterol, bisacodyl, metoprolol tartrate, oxyCODONE  Antibiotics  :    Anti-infectives (From admission, onward)    Start     Dose/Rate Route Frequency Ordered Stop   08/14/22 1000  cefTRIAXone (ROCEPHIN) 1 g in sodium chloride 0.9 % 100 mL IVPB        1 g 200 mL/hr over 30 Minutes Intravenous Every 24 hours 08/14/22 0914           Objective:   Vitals:   08/16/22 0000 08/16/22 0315 08/16/22 0800 08/16/22 1316  BP: (!) 164/76 (!) 182/86 119/64 123/68  Pulse: 74 75 76 76  Resp: 15 19 15 17   Temp: 98 F (36.7 C) 98 F (36.7 C)    TempSrc: Oral Oral    SpO2: 99%  99% 94%  Weight:      Height:        Wt Readings from Last 3 Encounters:  08/15/22 88.4 kg  06/06/22 82.6 kg  05/02/22 80.7 kg     Intake/Output Summary (Last 24 hours) at 08/16/2022 1614 Last data filed at 08/15/2022 1800 Gross per 24 hour  Intake 340 ml  Output --  Net 340 ml     Physical Exam   Awake Alert, Oriented X 3, extremely frail Symmetrical Chest wall movement, improved air entry today RRR,No Gallops,Rubs or new Murmurs, No Parasternal Heave +ve B.Sounds, Abd Soft, No tenderness, No rebound - guarding or rigidity. No Cyanosis, Clubbing, +1 edema bilaterally      Data Review:    Recent Labs  Lab 08/11/22 0531 08/11/22 2012 08/12/22 0226 08/13/22 0522 08/14/22 0617 08/15/22 0237 08/16/22 0450  WBC 9.6   < > 11.5* 9.4 9.5 3.9* 6.4  HGB 9.7*   < > 10.6* 10.2* 11.0* 9.8* 9.8*  HCT 29.4*   < >  32.7* 32.1* 34.3* 31.1* 30.7*  PLT 222   < > 233 250 296 216 206  MCV 96.1   < > 98.8 98.8 98.8 99.4 98.4  MCH 31.7   < > 32.0 31.4 31.7 31.3 31.4  MCHC 33.0   < > 32.4 31.8 32.1 31.5 31.9  RDW 13.2   < > 13.1 13.2 13.2 12.8 12.7  LYMPHSABS 0.7  --  0.9 1.0 1.0 0.3*  --   MONOABS 1.0  --  1.2* 1.0 1.1* 0.1  --   EOSABS 0.3  --  0.2 0.4 0.4 0.0  --   BASOSABS 0.0  --  0.1 0.1 0.1 0.0  --    < > = values in this interval not displayed.    Recent Labs  Lab 08/10/22 0322 08/10/22 1123 08/11/22 0531 08/12/22 0226 08/13/22 0522 08/14/22 0617 08/15/22 0237 08/16/22 0450  NA 138  --  138 134* 136 139 136 136  K 3.9  --  4.1 4.9 4.8 4.1 5.0 4.9  CL 100  --  102 96* 96* 93* 93* 93*  CO2 27  --  29 29 31  34* 34* 34*  ANIONGAP 11  --  7 9 9 12 9 9   GLUCOSE 80  --  95 103* 98 92 141* 118*  BUN 11  --  16 14 16 15 18 21   CREATININE 1.15  --  1.16 1.10 1.15 1.25* 1.05 1.18  AST 19  --   --   --   --   --   --   --   ALT 16  --   --   --   --   --   --   --   ALKPHOS 84  --   --   --   --   --   --   --   BILITOT 0.7  --   --   --   --   --   --   --   ALBUMIN 2.9*  --   --   --   --   --   --   --   TSH  --  2.962  --   --   --   --   --   --   BNP  --   --  104.9* 147.0* 144.5* 143.2*  --   --   MG  --  1.9 1.9 2.3 2.1 2.1  --   --   CALCIUM 8.7*  --  8.4* 8.6* 8.6* 8.7* 8.5* 8.5*      Radiology Reports No results found.    Signature  -   Phillips Climes M.D on 08/16/2022 at 4:14 PM   -  To page go to [email or web address]

## 2022-08-16 NOTE — Plan of Care (Signed)

## 2022-08-17 LAB — CBC
HCT: 32 % — ABNORMAL LOW (ref 39.0–52.0)
Hemoglobin: 10 g/dL — ABNORMAL LOW (ref 13.0–17.0)
MCH: 30.8 pg (ref 26.0–34.0)
MCHC: 31.3 g/dL (ref 30.0–36.0)
MCV: 98.5 fL (ref 80.0–100.0)
Platelets: 212 10*3/uL (ref 150–400)
RBC: 3.25 MIL/uL — ABNORMAL LOW (ref 4.22–5.81)
RDW: 12.6 % (ref 11.5–15.5)
WBC: 6.5 10*3/uL (ref 4.0–10.5)
nRBC: 0 % (ref 0.0–0.2)

## 2022-08-17 MED ORDER — FUROSEMIDE 20 MG PO TABS: 20.0000 mg | ORAL_TABLET | Freq: Every day | ORAL | Status: DC | PRN

## 2022-08-17 MED ORDER — DOCUSATE SODIUM 100 MG PO CAPS: 200.0000 mg | ORAL_CAPSULE | Freq: Two times a day (BID) | ORAL | 0 refills | Status: AC

## 2022-08-17 MED ORDER — METOPROLOL TARTRATE 25 MG PO TABS: 25.0000 mg | ORAL_TABLET | Freq: Two times a day (BID) | ORAL | Status: DC

## 2022-08-17 MED ORDER — PREDNISONE 10 MG PO TABS: ORAL_TABLET | ORAL | 0 refills | Status: DC

## 2022-08-17 MED ORDER — ALBUTEROL SULFATE (2.5 MG/3ML) 0.083% IN NEBU: 2.5000 mg | INHALATION_SOLUTION | Freq: Four times a day (QID) | RESPIRATORY_TRACT | 12 refills | Status: DC | PRN

## 2022-08-17 MED ORDER — MIDODRINE HCL 2.5 MG PO TABS: 2.5000 mg | ORAL_TABLET | Freq: Two times a day (BID) | ORAL | Status: DC

## 2022-08-17 MED ORDER — CEPHALEXIN 250 MG PO CAPS: 250.0000 mg | ORAL_CAPSULE | Freq: Four times a day (QID) | ORAL | 0 refills | Status: AC

## 2022-08-17 MED ORDER — GUAIFENESIN ER 600 MG PO TB12: 1200.0000 mg | ORAL_TABLET | Freq: Two times a day (BID) | ORAL | Status: DC

## 2022-08-17 NOTE — TOC Progression Note (Signed)
Transition of Care [facility]) - Progression Note    Patient Details  Name: GEMAYEL MASCIO MRN: [id number] Date of Birth: 12-03-1945  Transition of Care [facility]) CM/SW Contact  [address], [place], [place] Phone Number: 08/17/2022, 11:33 AM  Clinical Narrative:    Patient to discharge to [facility] today. Patient will be transported buy PTAR. Patient will be going to room 208. RN to call report to [phone]. Patient's spouse notified.  Erian Lariviere, LCSW Transition of Care     Expected Discharge Plan: Skilled Nursing Facility Barriers to Discharge: Continued Medical Work up  Expected Discharge Plan and Services In-house Referral: Clinical Social Work   Post Acute Care Choice: [place] Living arrangements for the past 2 months: [place] Expected Discharge Date: 08/17/22                                     Social Determinants of Health (SDOH) Interventions SDOH Screenings   Depression (PHQ2-9): Low Risk  (08/09/2022)  Tobacco Use: Medium Risk (08/16/2022)    Readmission Risk Interventions    08/13/2022    1:46 PM 01/31/2020   10:35 AM  Readmission Risk Prevention Plan  Transportation Screening Complete Complete  PCP or Specialist Appt within 5-7 Days Complete Complete  Home Care Screening Complete Complete  Medication Review (RN CM) Complete Complete

## 2022-08-17 NOTE — Discharge Summary (Signed)
Physician Discharge Summary  Francisco Francisco Francisco Francisco:[id number] DOB: 1945/11/23 DOA: 08/09/2022  PCP: [name], MD  Admit date: 08/09/2022 Discharge date: 08/17/2022  Admitted From: (Home) Disposition:  (SNF)  Recommendations for Outpatient Follow-up:  Please obtain BMP/CBC in 3 days to ensure stable hemoglobin now he is back on Eliquis She is on 4 to 5 L nasal cannula, please keep encouraging patient to use incentive spirometer and flutter valve He is on Lasix as needed currently, changed to scheduled if needed Patient on low-dose midodrine, consider to discontinue if blood pressure continues to increase.   Discharge Condition: (Stable) CODE STATUS: (FULL) Diet recommendation: Heart Healthy   Brief/Interim Summary:  77 y.o. male with medical history significant of COPD, venous stasis, PAD, anemia, hyperlipidemia, history of GI bleed with AVM, BPH, lung cancer on observation, essential tremor, hypertension, CHF, PE on Eliquis presenting with dark stools from PCP office.   Patient has had dark stools for the past 2 days.  Is on iron therapy but PCP felt he looked pale and sent him to the ED for further evaluation for possible GI bleed.  This is in the setting of history of known prior GI bleed and currently on Eliquis.  Patient additionally had a fall 2 weeks ago and has had some low back pain since that time.  Additionally is reporting some constipation.   In the ER he was found to have another L-spine compression fracture acute on chronic, of note he was due to be seen at [facility] in the next coming days, he was also found to be anemic and admitted to the hospital for pain control and anemia treatment.    Upper GI bleed with bleeding, with acute blood loss related anemia.   Has heme positive and melanotic stools, seen by  GI and underwent EGD on 08/12/2022 showing Dieulafoy lesion of stomach located in the gastric fundus along with AVMs in duodenum, Spectre lesions were  cauterized, Eliquis has been held for few days, it was resumed on 08/16/2022 as his hemoglobin remained stable, continue to monitor hemoglobin closely as an outpatient.     Acute on chronic low back pain.   - Has history of L-spine fractures in the past, had fall 2 weeks ago after which looks like he has a subacute L2 compression fracture as well.  seen by orthopedics here , continue present medical treatment with LSO brace and pain control, PT OT, post discharge follow-up with Dr. [name] at [facility] in 1 week. -Patient did not require any as needed oxycodone for last 4 days, so I will not prescribe at time of discharge.     Severe underlying COPD with cor pulmonale and pulmonary hypertension.   Acute on chronic hypoxic respiratory failure COPD exacerbation He is on 4 to 5 L of oxygen at all times, he was with significant wheezing, productive phlegm, this was improved on IV steroids, Rocephin and chest physiotherapy, he will be discharged on prednisone taper, another 3 days of oral Keflex, discussed with patient and wife, he understands importance of keeping doing incentive spirometer and flutter valve, will continue with Mucinex as well.   - patient will need to be continued on his baseline prednisone 10 mg oral daily after his taper.  Acute on chronic diastolic CHF EF 70%.   Does have evidence of fluid overload continue gentle diuresis and monitor.  Has been placed on beta-blocker. -Pressure initially soft for which Lasix has been held, patient currently appears to be a  euvolemic despite not receiving any Lasix for few days, so his Lasix will be changed to as needed.   PAD.  No acute issues.  Continue statin for secondary prevention.   History of PE.   - Eliquis resumed before discharge as his GI bleed appears to have stabilized   Dyslipidemia.  On statin.   History of lung cancer.  Outpatient follow-up with his pulmonologist [name] and his oncologist.   Hypertension.>>   Hypotension -Pressure currently acceptable, continue with low-dose midodrine 2.5 mg p.o. twice daily, consider to discontinue if blood pressure started to increase.   Discharge Diagnoses:  Principal Problem:   GI bleed Active Problems:   Hyperlipidemia   BPH associated with nocturia   Chronic respiratory failure with hypoxia (HCC)   Essential hypertension   Essential tremor   Chronic pulmonary embolism (HCC)   Stage 4 very severe COPD by GOLD classification (HCC)   PAD (peripheral artery disease) (HCC)   Malignant neoplasm of lower lobe of right lung (HCC)   Iron deficiency anemia due to chronic blood loss   Chronic systolic congestive heart failure (HCC)   Melena   Dieulafoy lesion of stomach   Gastritis and gastroduodenitis   AVM (arteriovenous malformation) of duodenum, acquired   ABLA (acute blood loss anemia)    Discharge Instructions  Discharge Instructions     Diet - low sodium heart healthy   Complete by: As directed    Discharge instructions   Complete by: As directed    Follow with Primary MD /SNF physician   Get CBC, CMP, checked  by Primary MD next visit.    Activity: As tolerated with Full fall precautions use walker/cane & assistance as needed   Disposition SNF   Diet: Heart Healthy    On your next visit with your primary care physician please Get Medicines reviewed and adjusted.   Please request your Prim.MD to go over all Hospital Tests and Procedure/Radiological results at the follow up, please get all Hospital records sent to your Prim MD by signing hospital release before you go home.   If you experience worsening of your admission symptoms, develop shortness of breath, life threatening emergency, suicidal or homicidal thoughts you must seek medical attention immediately by calling 911 or calling your MD immediately  if symptoms less severe.  You Must read complete instructions/literature along with all the possible adverse reactions/side  effects for all the Medicines you take and that have been prescribed to you. Take any new Medicines after you have completely understood and accpet all the possible adverse reactions/side effects.   Do not drive, operating heavy machinery, perform activities at heights, swimming or participation in water activities or provide baby sitting services if your were admitted for syncope or siezures until you have seen by Primary MD or a Neurologist and advised to do so again.  Do not drive when taking Pain medications.    Do not take more than prescribed Pain, Sleep and Anxiety Medications  Special Instructions: If you have smoked or chewed Tobacco  in the last 2 yrs please stop smoking, stop any regular Alcohol  and or any Recreational drug use.  Wear Seat belts while driving.   Please note  You were cared for by a hospitalist during your hospital stay. If you have any questions about your discharge medications or the care you received while you were in the hospital after you are discharged, you can call the unit and asked to speak with the hospitalist on  call if the hospitalist that took care of you is not available. Once you are discharged, your primary care physician will handle any further medical issues. Please note that NO REFILLS for any discharge medications will be authorized once you are discharged, as it is imperative that you return to your primary care physician (or establish a relationship with a primary care physician if you do not have one) for your aftercare needs so that they can reassess your need for medications and monitor your lab values.   Increase activity slowly   Complete by: As directed       Allergies as of 08/17/2022       Reactions   Tape Other (See Comments)   Skin tears easily.   Augmentin [amoxicillin-pot Clavulanate] Other (See Comments)   Thrush Sore throat Hoarse voice   Cipro [ciprofloxacin Hcl] Nausea And Vomiting   Weakness Fatigue    Gadavist  [gadobutrol] Other (See Comments)   Unknown reaction   Levaquin [levofloxacin] Other (See Comments)   Hallucinations    Lipitor [atorvastatin] Rash        Medication List     STOP taking these medications    doxycycline 100 MG tablet Commonly known as: VIBRA-TABS   oxycodone 5 MG capsule Commonly known as: OXY-IR       TAKE these medications    acetaminophen 500 MG tablet Commonly known as: TYLENOL Take 500 mg by mouth every 4 (four) hours as needed for moderate pain, fever, headache or mild pain.   albuterol 108 (90 Base) MCG/ACT inhaler Commonly known as: VENTOLIN HFA USE 2 INHALATIONS BY MOUTH  EVERY 6 HOURS AS NEEDED FOR WHEEZING What changed:  how much to take how to take this when to take this reasons to take this additional instructions   albuterol (2.5 MG/3ML) 0.083% nebulizer solution Commonly known as: PROVENTIL Take 3 mLs (2.5 mg total) by nebulization every 6 (six) hours as needed for wheezing or shortness of breath. What changed: You were already taking a medication with the same name, and this prescription was added. Make sure you understand how and when to take each.   azithromycin 250 MG tablet Commonly known as: ZITHROMAX Take 1 tablet (250 mg total) by mouth 3 (three) times a week. What changed: when to take this   CALCIUM 600+D PO Take 2 tablets by mouth daily.   cephALEXin 250 MG capsule Commonly known as: KEFLEX Take 1 capsule (250 mg total) by mouth 4 (four) times daily for 3 days. Start taking on: August 18, 2022   docusate sodium 100 MG capsule Commonly known as: COLACE Take 2 capsules (200 mg total) by mouth 2 (two) times daily.   Eliquis 2.5 MG Tabs tablet Generic drug: apixaban TAKE 1 TABLET BY MOUTH TWICE  DAILY What changed: how much to take   fluticasone 50 MCG/ACT nasal spray Commonly known as: FLONASE USE 2 SPRAYS IN BOTH  NOSTRILS DAILY What changed: See the new instructions.   folic acid 1 MG tablet Commonly  known as: FOLVITE Take 1 tablet (1 mg total) by mouth daily.   furosemide 20 MG tablet Commonly known as: LASIX Take 1 tablet (20 mg total) by mouth daily as needed for fluid or edema. What changed: See the new instructions.   guaiFENesin 600 MG 12 hr tablet Commonly known as: MUCINEX Take 2 tablets (1,200 mg total) by mouth 2 (two) times daily.   Iron 325 (65 Fe) MG Tabs Take 325 mg by mouth every other day.  metoprolol tartrate 25 MG tablet Commonly known as: LOPRESSOR Take 1 tablet (25 mg total) by mouth 2 (two) times daily.   midodrine 2.5 MG tablet Commonly known as: PROAMATINE Take 1 tablet (2.5 mg total) by mouth 2 (two) times daily with a meal.   multivitamin with minerals Tabs tablet Take 1 tablet by mouth daily.   OXYGEN Inhale 4-5 L into the lungs continuous.   pantoprazole 20 MG tablet Commonly known as: PROTONIX TAKE 1 TABLET BY MOUTH  DAILY   predniSONE 10 MG tablet Commonly known as: DELTASONE Please take 40 mg oral daily x 2 days, then 30 mg oral daily x 2 days, then 20 mg oral daily x 2 days, then back on home dose of 10 mg oral daily thereafter What changed:  how much to take how to take this when to take this additional instructions   roflumilast 500 MCG Tabs tablet Commonly known as: DALIRESP TAKE 1 TABLET BY MOUTH  DAILY   rosuvastatin 10 MG tablet Commonly known as: CRESTOR TAKE 1 TABLET BY MOUTH DAILY   Spiriva Respimat 2.5 MCG/ACT Aers Generic drug: Tiotropium Bromide Monohydrate USE 2 INHALATIONS BY MOUTH  DAILY What changed: See the new instructions.   Symbicort 160-4.5 MCG/ACT inhaler Generic drug: budesonide-formoterol USE 2 INHALATIONS BY MOUTH  TWICE DAILY What changed: See the new instructions.   Systane 0.4-0.3 % Soln Generic drug: Polyethyl Glycol-Propyl Glycol Place 1 drop into both eyes every other day.        Contact information for after-discharge care     [facility] .   Service: Skilled Nursing Contact information: [address] [place]                    Allergies  Allergen Reactions   Tape Other (See Comments)    Skin tears easily.   Augmentin [Amoxicillin-Pot Clavulanate] Other (See Comments)    Thrush Sore throat Hoarse voice   Cipro [Ciprofloxacin Hcl] Nausea And Vomiting    Weakness Fatigue    Gadavist [Gadobutrol] Other (See Comments)    Unknown reaction   Levaquin [Levofloxacin] Other (See Comments)    Hallucinations    Lipitor [Atorvastatin] Rash    Consultations: GI Orthopedics ([facility])   Procedures/Studies: ECHOCARDIOGRAM COMPLETE  Result Date: 08/10/2022    ECHOCARDIOGRAM REPORT   Patient Name:   Francisco Francisco Date of Exam: 08/10/2022 Medical Rec #:  [id number]         Height:       72.0 in Accession #:    [id number]        Weight:       195.1 lb Date of Birth:  Nov 17, 1945         BSA:          2.108 m Patient Age:    77 years          BP:           151/78 mmHg Patient Gender: M                 HR:           116 bpm. Exam Location:  Inpatient Procedure: 2D Echo, Color Doppler, Cardiac Doppler and Intracardiac            Opacification Agent Indications:    CHF - Acute Diastolic  History:  Patient has prior history of Echocardiogram examinations, most                 recent 11/04/2017. CHF, PAD and COPD; Signs/Symptoms:Hypotension,                 Edema and Dyspnea. Lung cancer, pulmonary embolism.  Sonographer:    Eartha Inch Referring Phys: Graylin Shiver [facility] [address] [address]  Sonographer Comments: Technically difficult study due to poor echo windows. Image acquisition challenging due to patient body habitus and Image acquisition challenging due to respiratory motion. IMPRESSIONS  1. Left ventricular ejection fraction, by estimation, is 55 to 60%. The left ventricle has normal function. The left ventricle has no regional wall motion abnormalities. Left  ventricular diastolic function could not be evaluated.  2. Right ventricular systolic function is normal. The right ventricular size is normal.  3. A small pericardial effusion is present.  4. No evidence of mitral valve regurgitation.  5. The aortic valve was not well visualized. Aortic valve regurgitation is not visualized.  6. Not well visualized.  7. The inferior vena cava is normal in size with greater than 50% respiratory variability, suggesting right atrial pressure of 3 mmHg. FINDINGS  Left Ventricle: Left ventricular ejection fraction, by estimation, is 55 to 60%. The left ventricle has normal function. The left ventricle has no regional wall motion abnormalities. Definity contrast agent was given IV to delineate the left ventricular  endocardial borders. The left ventricular internal cavity size was normal in size. There is no left ventricular hypertrophy. Left ventricular diastolic function could not be evaluated. Right Ventricle: The right ventricular size is normal. Right ventricular systolic function is normal. Left Atrium: Left atrial size was normal in size. Right Atrium: Right atrial size was normal in size. Pericardium: A small pericardial effusion is present. Mitral Valve: No evidence of mitral valve regurgitation. Tricuspid Valve: Tricuspid valve regurgitation is not demonstrated. Aortic Valve: The aortic valve was not well visualized. Aortic valve regurgitation is not visualized. Pulmonic Valve: Pulmonic valve regurgitation is not visualized. Aorta: Not well visualized. Venous: The inferior vena cava is normal in size with greater than 50% respiratory variability, suggesting right atrial pressure of 3 mmHg. IAS/Shunts: The interatrial septum was not well visualized.  LEFT VENTRICLE PLAX 2D LVIDd:         4.00 cm LVIDs:         3.10 cm LV PW:         1.10 cm LV IVS:        0.90 cm  LEFT ATRIUM           Index LA diam:      3.50 cm 1.66 cm/m LA Vol (A4C): 25.1 ml 11.91 ml/m Phineas Inches  Electronically signed by Phineas Inches Signature Date/Time: 08/10/2022/5:09:21 PM    Final    CT L-SPINE NO CHARGE  Result Date: 08/09/2022 CLINICAL DATA:  Abdominal pain.  Status post fall EXAM: CT LUMBAR SPINE WITHOUT CONTRAST TECHNIQUE: Multidetector CT imaging of the lumbar spine was performed without intravenous contrast administration. Multiplanar CT image reconstructions were also generated. RADIATION DOSE REDUCTION: This exam was performed according to the departmental dose-optimization program which includes automated exposure control, adjustment of the mA and/or kV according to patient size and/or use of iterative reconstruction technique. COMPARISON:  Abdominal MRI from 07/27/2022 and CT chest 06/21/2022. FINDINGS: Segmentation: 5 lumbar type vertebrae. Alignment: Mild curvature of the lumbar spine is convex towards the left. Vertebrae: Acute to subacute fracture deformity is identified involving the  L2 vertebral body. This is new when compared with the MRI from 07/27/2022. Loss of approximately 50% of the central vertebral body height is identified. Mild retropulsion is identified along the posterior aspect of the superior endplate, image 56/4. L3 mild inferior endplate compression deformity is unchanged when compared with the MRI from 07/27/22 with loss of approximately 10% of the central vertebral body height. Chronic fracture deformity involving the L4 vertebral body is again noted with loss of approximately 70% of the central vertebral body height. Paraspinal and other soft tissues: Negative. Disc levels: Disc spaces are relatively well preserved. No significant degenerative disc disease. IMPRESSION: 1. Acute to subacute fracture deformity of the L2 vertebral body is new when compared with the MRI from 07/27/2022. Loss of approximately 50% of the central vertebral body height is identified. Mild retropulsion is identified along the posterior aspect of the superior endplate. 2. Chronic fracture  deformity involving the L4 vertebral body is again noted with loss of approximately 70% of the central vertebral body height. 3. Age-indeterminate L3 inferior endplate compression deformity is unchanged when compared with the MRI from 07/27/22. Electronically Signed   By: Kerby Moors M.D.   On: 08/09/2022 18:01   CT ABDOMEN PELVIS W CONTRAST  Result Date: 08/09/2022 CLINICAL DATA:  Acute abdominal pain EXAM: CT ABDOMEN AND PELVIS WITH CONTRAST TECHNIQUE: Multidetector CT imaging of the abdomen and pelvis was performed using the standard protocol following bolus administration of intravenous contrast. RADIATION DOSE REDUCTION: This exam was performed according to the departmental dose-optimization program which includes automated exposure control, adjustment of the mA and/or kV according to patient size and/or use of iterative reconstruction technique. CONTRAST:  54mL OMNIPAQUE IOHEXOL 350 MG/ML SOLN COMPARISON:  PET-CT 08/08/2021. MRI abdomen 07/27/2022. Lumbar spine MRI 06/06/2020. FINDINGS: Lower chest: Emphysema present Hepatobiliary: No focal liver abnormality is seen. No gallstones, gallbladder wall thickening, or biliary dilatation. Pancreas: There is a 1.7 x 1.7 cm low-attenuation/cystic area in the tail the pancreas which appears stable. No acute inflammation or ductal dilatation. Spleen: Normal in size without focal abnormality. Adrenals/Urinary Tract: Right inferior pole cyst measures 15 mm. Otherwise, the kidneys, adrenal glands and bladder are within normal limits. Stomach/Bowel: Stomach is within normal limits. Appendix is not seen. No evidence of bowel wall thickening, distention, or inflammatory changes. Vascular/Lymphatic: Aortic atherosclerosis. No enlarged abdominal or pelvic lymph nodes. Reproductive: Prostate gland is mildly enlarged. Other: Left inguinal hernia containing colon is moderate in size and has increased from prior study. There is a small right inguinal hernia containing  bladder, unchanged. There is no ascites. Musculoskeletal: Chronic compression fracture of L4 is unchanged. There is a acute appearing compression fracture of L2 with mild retropulsion of fracture fragments (30% vertebral body height loss). IMPRESSION: 1. Acute appearing compression fracture of L2 with mild retropulsion of fracture fragments. Please correlate clinically. 2. Left inguinal hernia containing colon has increased in size. No bowel obstruction. 3. Stable right inguinal hernia containing bladder. 4. 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 Aortic Atherosclerosis (ICD10-I70.0). Electronically Signed   By: Ronney Asters M.D.   On: 08/09/2022 17:56   MR Abdomen W Wo Contrast  Result Date: 07/28/2022 CLINICAL DATA:  Follow-up pancreatic cysts. EXAM: MRI ABDOMEN WITHOUT AND WITH CONTRAST TECHNIQUE: Multiplanar multisequence MR imaging of the abdomen was performed both before and after the administration of intravenous contrast. CONTRAST:  2mL GADAVIST GADOBUTROL 1 MMOL/ML IV SOLN COMPARISON:  CT abdomen pelvis December 11, 2018, PET-CT August 08, 2021 and chest CT June 21, 2022. FINDINGS: Study is degraded by artifact related to motion and patient habitus, this lower sensitivity and specificity. Lower chest: No acute abnormality. Hepatobiliary: No suspicious hepatic lesion. Gallbladder is unremarkable. No biliary ductal dilation Pancreas: Large cystic lesion in the pancreatic tail measures 2.4 x 1.4 cm on image 22/3 without suspicious postcontrast enhancement or nodularity. Additional cystic lesions in the pancreatic tail body and head measure up to 9 mm on image 22/3 none of which demonstrate suspicious postcontrast enhancement or nodularity. No pancreatic ductal dilation. No evidence of acute inflammation. Spleen:  No splenomegaly. Adrenals/Urinary Tract: Well-circumscribed lobular T2 hyperintense right lower pole renal lesion measures 2.1 x 1.6 cm on image 24/3 the lesion demonstrates greater than 4  thin internal enhancing septations without nodularity or mural/septal thickening, compatible with a Bosniak classification 41f renal cyst. Bilateral subcentimeter fluid signal renal lesions are compatible with cysts and considered benign requiring no independent imaging follow-up. Stomach/Bowel: Visualized portions within the abdomen are unremarkable. Vascular/Lymphatic: Aortic atherosclerosis. Smooth IVC contours. The portal, splenic and superior mesenteric veins are patent. No pathologically enlarged abdominal lymph nodes. Other:  No significant abdominal free fluid. Musculoskeletal: Chronic L4 compression deformity. Chronic irregularity of the L3 inferior endplate. IMPRESSION: Examination is degraded by artifact related to patient motion and body habitus which lower sensitivity and specificity. Within this context: 1. Cystic 2.4 cm lesion in the pancreatic tail and additional smaller cystic pancreatic lesions measure up to 9 mm common no suspicious MRI features. Given the size of the 2.4 cm cystic lesion consider further evaluation with EUS/FNA versus follow-up MRCP with and without contrast in 6 months. 2. Bosniak classification 73F right lower pole renal cyst measuring 2.1 cm. The large majority of Bosniak IIF masses are benign. When malignant, nearly all are indolent. Generally, Bosniak IIF masses are followed by imaging at 6 months and 12 months (MRI preferred over CT), then annually for a total of 5 years to assess for morphologic change. (Reference: Bosniak Classification of Cystic Renal Masses, Version 2019. Radiology 2019; [phone]): 702-637.) Electronically Signed   By: Dahlia Bailiff M.D.   On: 07/28/2022 09:42   MR Brain W Wo Contrast  Result Date: 07/26/2022 CLINICAL DATA:  Small cell lung cancer monitoring EXAM: MRI HEAD WITHOUT AND WITH CONTRAST TECHNIQUE: Multiplanar, multiecho pulse sequences of the brain and surrounding structures were obtained without and with intravenous contrast. CONTRAST:   8.34mL GADAVIST GADOBUTROL 1 MMOL/ML IV SOLN COMPARISON:  01/19/2022 FINDINGS: Brain: No enhancement or swelling to suggest metastatic disease. No incidental infarct, hemorrhage, hydrocephalus, or collection. Vascular: Normal flow voids. Skull and upper cervical spine: Negative for marrow lesion Sinuses/Orbits: No significant finding.  Negative for mass. IMPRESSION: Negative for metastatic disease to the brain. Electronically Signed   By: Jorje Guild M.D.   On: 07/26/2022 07:25      Subjective:  No significant events overnight as discussed with staff, he reports some mild dizziness upon standing up, otherwise he has been compliant with his incentive spirometer and flutter valve, reports he is feeling better today. Discharge Exam: Vitals:   08/17/22 0421 08/17/22 1008  BP: 125/61 135/79  Pulse: 76 89  Resp: 20   Temp: (!) 97.4 F (36.3 C)   SpO2:     Vitals:   08/16/22 2355 08/17/22 0421 08/17/22 0500 08/17/22 1008  BP: (!) 157/76 125/61  135/79  Pulse: 72 76  89  Resp: 17 20    Temp: 97.7 F (36.5 C) (!) 97.4 F (36.3 C)    TempSrc:  Oral Oral    SpO2:      Weight:   88.4 kg   Height:        General: Pt is alert, awake, not in acute distress, frail, chronically ill-appearing Cardiovascular: RRR, S1/S2 +, no rubs, no gallops Respiratory: Good air entry bilaterally with scattered Rales Abdominal: Soft, NT, ND, bowel sounds + Extremities: Trace edema bilaterally (much improved from baseline), chronic skin desquamation and lower extremity    The results of significant diagnostics from this hospitalization (including imaging, microbiology, ancillary and laboratory) are listed below for reference.     Microbiology: No results found for this or any previous visit (from the past 240 hour(s)).   Labs: BNP (last 3 results) Recent Labs    08/12/22 0226 08/13/22 0522 08/14/22 0617  BNP 147.0* 144.5* 466.5*   Basic Metabolic Panel: Recent Labs  Lab 08/10/22 1123  08/11/22 0531 08/11/22 0531 08/12/22 0226 08/13/22 0522 08/14/22 0617 08/15/22 0237 08/16/22 0450  NA  --  138   < > 134* 136 139 136 136  K  --  4.1   < > 4.9 4.8 4.1 5.0 4.9  CL  --  102   < > 96* 96* 93* 93* 93*  CO2  --  29   < > 29 31 34* 34* 34*  GLUCOSE  --  95   < > 103* 98 92 141* 118*  BUN  --  16   < > 14 16 15 18 21   CREATININE  --  1.16   < > 1.10 1.15 1.25* 1.05 1.18  CALCIUM  --  8.4*   < > 8.6* 8.6* 8.7* 8.5* 8.5*  MG 1.9 1.9  --  2.3 2.1 2.1  --   --    < > = values in this interval not displayed.   Liver Function Tests: No results for input(s): "AST", "ALT", "ALKPHOS", "BILITOT", "PROT", "ALBUMIN" in the last 168 hours. No results for input(s): "LIPASE", "AMYLASE" in the last 168 hours. No results for input(s): "AMMONIA" in the last 168 hours. CBC: Recent Labs  Lab 08/11/22 0531 08/11/22 2012 08/12/22 0226 08/13/22 0522 08/14/22 0617 08/15/22 0237 08/16/22 0450 08/17/22 0325  WBC 9.6   < > 11.5* 9.4 9.5 3.9* 6.4 6.5  NEUTROABS 7.5  --  9.0* 7.0 7.0 3.4  --   --   HGB 9.7*   < > 10.6* 10.2* 11.0* 9.8* 9.8* 10.0*  HCT 29.4*   < > 32.7* 32.1* 34.3* 31.1* 30.7* 32.0*  MCV 96.1   < > 98.8 98.8 98.8 99.4 98.4 98.5  PLT 222   < > 233 250 296 216 206 212   < > = values in this interval not displayed.   Cardiac Enzymes: No results for input(s): "CKTOTAL", "CKMB", "CKMBINDEX", "TROPONINI" in the last 168 hours. BNP: Invalid input(s): "POCBNP" CBG: No results for input(s): "GLUCAP" in the last 168 hours. D-Dimer No results for input(s): "DDIMER" in the last 72 hours. Hgb A1c No results for input(s): "HGBA1C" in the last 72 hours. Lipid Profile No results for input(s): "CHOL", "HDL", "LDLCALC", "TRIG", "CHOLHDL", "LDLDIRECT" in the last 72 hours. Thyroid function studies No results for input(s): "TSH", "T4TOTAL", "T3FREE", "THYROIDAB" in the last 72 hours.  Invalid input(s): "FREET3" Anemia work up No results for input(s): "VITAMINB12", "FOLATE",  "FERRITIN", "TIBC", "IRON", "RETICCTPCT" in the last 72 hours. Urinalysis    Component Value Date/Time   COLORURINE YELLOW 04/15/2019 1530   APPEARANCEUR CLEAR 04/15/2019 1530   LABSPEC 1.012  04/15/2019 1530   PHURINE 7.0 04/15/2019 1530   GLUCOSEU NEGATIVE 04/15/2019 1530   GLUCOSEU NEGATIVE 06/25/2017 0831   HGBUR SMALL (A) 04/15/2019 1530   HGBUR trace-lysed 02/12/2010 [place] 04/15/2019 1530   BILIRUBINUR Negative 12/25/2017 0852   [place] 04/15/2019 1530   PROTEINUR NEGATIVE 04/15/2019 1530   UROBILINOGEN 0.2 12/25/2017 0852   UROBILINOGEN 0.2 06/25/2017 0831   NITRITE NEGATIVE 04/15/2019 1530   LEUKOCYTESUR NEGATIVE 04/15/2019 1530   Sepsis Labs Recent Labs  Lab 08/14/22 0617 08/15/22 0237 08/16/22 0450 08/17/22 0325  WBC 9.5 3.9* 6.4 6.5   Microbiology No results found for this or any previous visit (from the past 240 hour(s)).   Time coordinating discharge: Over 30 minutes  SIGNED:   Phillips Climes, MD  [facility] 08/17/2022, 10:37 AM Pager   If 7PM-7AM, please contact night-coverage [email or web address]

## 2022-08-17 NOTE — Discharge Instructions (Addendum)
Follow with Primary MD /SNF physician   Get CBC, CMP, checked  by Primary MD next visit.    Activity: As tolerated with Full fall precautions use walker/cane & assistance as needed   Disposition SNF   Diet: Heart Healthy    On your next visit with your primary care physician please Get Medicines reviewed and adjusted.   Please request your Prim.MD to go over all Hospital Tests and Procedure/Radiological results at the follow up, please get all Hospital records sent to your Prim MD by signing hospital release before you go home.   If you experience worsening of your admission symptoms, develop shortness of breath, life threatening emergency, suicidal or homicidal thoughts you must seek medical attention immediately by calling 911 or calling your MD immediately  if symptoms less severe.  You Must read complete instructions/literature along with all the possible adverse reactions/side effects for all the Medicines you take and that have been prescribed to you. Take any new Medicines after you have completely understood and accpet all the possible adverse reactions/side effects.   Do not drive, operating heavy machinery, perform activities at heights, swimming or participation in water activities or provide baby sitting services if your were admitted for syncope or siezures until you have seen by Primary MD or a Neurologist and advised to do so again.  Do not drive when taking Pain medications.    Do not take more than prescribed Pain, Sleep and Anxiety Medications  Special Instructions: If you have smoked or chewed Tobacco  in the last 2 yrs please stop smoking, stop any regular Alcohol  and or any Recreational drug use.  Wear Seat belts while driving.   Please note  You were cared for by a hospitalist during your hospital stay. If you have any questions about your discharge medications or the care you received while you were in the hospital after you are discharged, you can call  the unit and asked to speak with the hospitalist on call if the hospitalist that took care of you is not available. Once you are discharged, your primary care physician will handle any further medical issues. Please note that NO REFILLS for any discharge medications will be authorized once you are discharged, as it is imperative that you return to your primary care physician (or establish a relationship with a primary care physician if you do not have one) for your aftercare needs so that they can reassess your need for medications and monitor your lab values.

## 2022-08-19 ENCOUNTER — Encounter: Payer: Self-pay | Admitting: Family Medicine

## 2022-08-19 DIAGNOSIS — D649 Anemia, unspecified: Secondary | ICD-10-CM

## 2022-08-19 DIAGNOSIS — J449 Chronic obstructive pulmonary disease, unspecified: Secondary | ICD-10-CM

## 2022-08-19 DIAGNOSIS — S32020A Wedge compression fracture of second lumbar vertebra, initial encounter for closed fracture: Secondary | ICD-10-CM

## 2022-08-21 NOTE — Progress Notes (Deleted)
Cardiology Office Note:   Date:  08/21/2022  NAME:  Francisco [name]    MRN: [id number] DOB:  1946-03-11   PCP:  [name], MD  Cardiologist:  None  Electrophysiologist:  None   Referring MD: [name], MD   No chief complaint on file. ***  History of Present Illness:   Francisco [name] is a 77 y.o. male with a hx of COPD, lung CA, PAD, HFpEF who is being seen today for the evaluation of HFpEF at the request of [name], MD.  Problem List COPD PAD -bilateral common femoral endarterectomy  HFpEF PE HTN Lung CA -Radiation 06/2022 7. Coronary calcifications   Past Medical History: Past Medical History:  Diagnosis Date   Chronic back pain    "mid and lower" (04/07/2018)   Chronic rhinitis    -Sinus Ct 08/01/2009 >> Bilateral maxillary sinusitis with some mucosal thickeningin the sphenoid and frontal sinuses as well with air fluid levels present -chronic rhinitis flyer Aug 04, 2009   Compressed spine fracture [facility])    COPD (chronic obstructive pulmonary disease) (HCC)    PFT's rec Jul 17, 2009   Dyspnea    Emphysema lung ([place])    On home oxygen therapy    "3L; 24/7" (04/07/2018)   Onychomycosis    Dr. Judi Cong   Orthostatic hypotension    "since 10/2017" (04/07/2018)   PAD (peripheral artery disease) ([place])    Pneumonia    "twice in 1 year" (04/07/2018)   Pulmonary embolism ([place]) 04/07/2018   Skin cancer    "lips, face, ears, arms" (04/07/2018)   Small cell lung cancer, right ([place]) 2023   Dr. Lisbeth Renshaw.  rad   Vertigo    "since ~ 02/2018" (04/07/2018)    Past Surgical History: Past Surgical History:  Procedure Laterality Date   ABDOMINAL AORTOGRAM W/LOWER EXTREMITY N/A 01/21/2020   Procedure: ABDOMINAL AORTOGRAM W/LOWER EXTREMITY;  Surgeon: Elam Dutch, MD;  Location: [place] [facility];  Service: Cardiovascular;  Laterality: N/A;   APPENDECTOMY     BIOPSY  02/20/2020   Procedure: BIOPSY;  Surgeon: Rush Landmark Telford Nab., MD;  Location: [place];  Service: Gastroenterology;;   BIOPSY  08/12/2022   Procedure: BIOPSY;  Surgeon: Lavena Bullion, DO;  Location: [place] [facility];  Service: Gastroenterology;;   BRONCHIAL BIOPSY  09/04/2021   Procedure: BRONCHIAL BIOPSIES;  Surgeon: Garner Nash, DO;  Location: [place] [facility];  Service: Pulmonary;;   BRONCHIAL BRUSHINGS  09/04/2021   Procedure: BRONCHIAL BRUSHINGS;  Surgeon: Garner Nash, DO;  Location: [place];  Service: Pulmonary;;   BRONCHIAL NEEDLE ASPIRATION BIOPSY  09/04/2021   Procedure: BRONCHIAL NEEDLE ASPIRATION BIOPSIES;  Surgeon: Garner Nash, DO;  Location: [place];  Service: Pulmonary;;   CATARACT EXTRACTION, BILATERAL     ENDARTERECTOMY FEMORAL Right 04/19/2019   Procedure: ENDARTERECTOMY RIGHT COMMON FEMORAL;  Surgeon: Elam Dutch, MD;  Location: [facility] OR;  Service: Vascular;  Laterality: Right;   ENDARTERECTOMY FEMORAL Left 01/24/2020   Procedure: LEFT FEMORAL ENDARTERECTOMY WITH DACRON PATCH ANGIOPLASTY;  Surgeon: Elam Dutch, MD;  [facility];  Service: Vascular;  Laterality: Left;   ESOPHAGOGASTRODUODENOSCOPY (EGD) WITH PROPOFOL N/A 02/20/2020   Procedure: ESOPHAGOGASTRODUODENOSCOPY (EGD) WITH PROPOFOL;  Surgeon: Irving Copas., MD;  Location: [place];  Service: Gastroenterology;  Laterality: N/A;   ESOPHAGOGASTRODUODENOSCOPY (EGD) WITH PROPOFOL N/A 08/12/2022   Procedure: ESOPHAGOGASTRODUODENOSCOPY (EGD) WITH PROPOFOL;  Surgeon: Lavena Bullion, DO;  Location: [place];  Service:  Gastroenterology;  Laterality: N/A;   FEMORAL ENDARTERECTOMY Left 01/24/2020   FIDUCIAL MARKER PLACEMENT  09/04/2021   Procedure: FIDUCIAL MARKER PLACEMENT;  Surgeon: Garner Nash, DO;  Location: [place] [facility];  Service: Pulmonary;;   HEMOSTASIS CLIP PLACEMENT  02/20/2020   Procedure: HEMOSTASIS CLIP PLACEMENT;  Surgeon: Irving Copas., MD;  Location: [place];  Service: Gastroenterology;;   HEMOSTASIS CLIP PLACEMENT  08/12/2022    Procedure: HEMOSTASIS CLIP PLACEMENT;  Surgeon: Lavena Bullion, DO;  Location: [place];  Service: Gastroenterology;;   HOT HEMOSTASIS N/A 02/20/2020   Procedure: HOT HEMOSTASIS (ARGON PLASMA COAGULATION/BICAP);  Surgeon: Irving Copas., MD;  Location: [place];  Service: Gastroenterology;  Laterality: N/A;   HOT HEMOSTASIS N/A 08/12/2022   Procedure: HOT HEMOSTASIS (ARGON PLASMA COAGULATION/BICAP);  Surgeon: Lavena Bullion, DO;  [facility];  Service: Gastroenterology;  Laterality: N/A;   INSERTION OF ILIAC STENT Left 01/24/2020   Procedure: INSERTION OF VBX STENT 8X59 AND 8X39 LEFT COMMON ILIAC ARTERY. INSERTION OF INNOVA 7 X 60 INNOVA STENT LEFT EXTERNAL ILIAC ARTERY. ;  Surgeon: Elam Dutch, MD;  Location: [place];  Service: Vascular;  Laterality: Left;   LOWER EXTREMITY ANGIOGRAPHY  12/11/2018   LOWER EXTREMITY ANGIOGRAPHY N/A 12/11/2018   Procedure: LOWER EXTREMITY ANGIOGRAPHY;  Surgeon: Elam Dutch, MD;  Location: [place] [facility];  Service: Cardiovascular;  Laterality: N/A;   PATCH ANGIOPLASTY Right 04/19/2019   Procedure: Patch Angioplasty;  Surgeon: Elam Dutch, MD;  [facility] OR;  Service: Vascular;  Laterality: Right;   PERIPHERAL VASCULAR INTERVENTION Right 12/11/2018   Procedure: PERIPHERAL VASCULAR INTERVENTION;  Surgeon: Elam Dutch, MD;  Location: [place] [facility];  Service: Cardiovascular;  Laterality: Right;  Common Iliac    SKIN CANCER EXCISION     "lips, face, ears, arms" (04/07/2018)   SUBMUCOSAL INJECTION  08/12/2022   Procedure: SUBMUCOSAL INJECTION;  Surgeon: Lavena Bullion, DO;  Location: [facility];  Service: Gastroenterology;;   TONSILLECTOMY     ULTRASOUND GUIDANCE FOR VASCULAR ACCESS Right 01/24/2020   Procedure: ULTRASOUND GUIDANCE FOR VASCULAR ACCESS;  Surgeon: Elam Dutch, MD;  Location: [place];  Service: Vascular;  Laterality: Right;   VIDEO BRONCHOSCOPY WITH RADIAL ENDOBRONCHIAL ULTRASOUND   09/04/2021   Procedure: VIDEO BRONCHOSCOPY WITH RADIAL ENDOBRONCHIAL ULTRASOUND;  Surgeon: Garner Nash, DO;  Location: [place];  Service: Pulmonary;;    Current Medications: No outpatient medications have been marked as taking for the 08/26/22 encounter (Appointment) with Geralynn Rile, MD.     Allergies:    Tape, Augmentin [amoxicillin-pot clavulanate], Cipro [ciprofloxacin hcl], Gadavist [gadobutrol], Levaquin [levofloxacin], and Lipitor [atorvastatin]   Social History: Social History   Socioeconomic History   Marital status: Married    Spouse name: Not on file   Number of children: 2   Years of education: 3 years of college   Highest education level: Not on file  Occupational History    Employer: [employer]  Tobacco Use   Smoking status: Former    Packs/day: 2.00    Years: 52.00    Total pack years: 104.00    Types: Pipe, Cigarettes    Start date: 10/06/1965    Quit date: 10/06/2017    Years since quitting: 4.8   Smokeless tobacco: Never   Tobacco comments:    04/07/2018 "smoked cigarettes years ago; stopped ~ 20 yr ago; stopped smoking pipe in 09/2017"  Vaping Use   Vaping Use: Never used  Substance and Sexual Activity  Alcohol use: Yes    Alcohol/week: 35.0 standard drinks of alcohol    Types: 35 Cans of beer per week   Drug use: No   Sexual activity: Not Currently  Other Topics Concern   Not on file  Social History Narrative   Married (wife Katy Apo patient of Dr. Yong Channel) with children (2-son and daughter). 1 grandson.       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       Hobbies: fishing, projects at home   Social Determinants of Radio broadcast assistant Strain: Not on file  Food Insecurity: Not on file  Transportation Needs: Not on file  Physical Activity: Not on file  Stress: Not on file  Social Connections: Not on file     Family History: The patient's ***family history includes Cancer in his mother;  Coronary artery disease in an other family member; Heart disease in his father and mother; Hepatitis in his sister; Stroke in his father. There is no history of Colon cancer, Pancreatic cancer, Esophageal cancer, Inflammatory bowel disease, Liver disease, Rectal cancer, or Stomach cancer.  ROS:   All other ROS reviewed and negative. Pertinent positives noted in the HPI.     EKGs/Labs/Other Studies Reviewed:   The following studies were personally reviewed by me today:  EKG:  EKG is *** ordered today.  The ekg ordered today demonstrates ***, and was personally reviewed by me.   TTE 08/10/2022  1. Left ventricular ejection fraction, by estimation, is 55 to 60%. The  left ventricle has normal function. The left ventricle has no regional  wall motion abnormalities. Left ventricular diastolic function could not  be evaluated.   2. Right ventricular systolic function is normal. The right ventricular  size is normal.   3. A small pericardial effusion is present.   4. No evidence of mitral valve regurgitation.   5. The aortic valve was not well visualized. Aortic valve regurgitation  is not visualized.   6. Not well visualized.   7. The inferior vena cava is normal in size with greater than 50%  respiratory variability, suggesting right atrial pressure of 3 mmHg.   Recent Labs: 08/10/2022: ALT 16; TSH 2.962 08/14/2022: B Natriuretic Peptide 143.2; Magnesium 2.1 08/16/2022: BUN 21; Creatinine, Ser 1.18; Potassium 4.9; Sodium 136 08/17/2022: Hemoglobin 10.0; Platelets 212   Recent Lipid Panel    Component Value Date/Time   CHOL 212 (H) 12/10/2021 0849   TRIG 78.0 12/10/2021 0849   HDL 109.70 12/10/2021 0849   CHOLHDL 2 12/10/2021 0849   VLDL 15.6 12/10/2021 0849   LDLCALC 86 12/10/2021 0849   LDLDIRECT 72.0 12/07/2020 0848    Physical Exam:   VS:  There were no vitals taken for this visit.   Wt Readings from Last 3 Encounters:  08/17/22 194 lb 14.2 oz (88.4 kg)  06/06/22 182 lb (82.6 kg)   05/02/22 178 lb (80.7 kg)    General: Well nourished, well developed, in no acute distress Head: Atraumatic, normal size  Eyes: PEERLA, EOMI  Neck: Supple, no JVD Endocrine: No thryomegaly Cardiac: Normal S1, S2; RRR; no murmurs, rubs, or gallops Lungs: Clear to auscultation bilaterally, no wheezing, rhonchi or rales  Abd: Soft, nontender, no hepatomegaly  Ext: No edema, pulses 2+ Musculoskeletal: No deformities, BUE and BLE strength normal and equal Skin: Warm and dry, no rashes   Neuro: Alert and oriented to person, place, time, and situation, CNII-XII grossly intact, no focal deficits  Psych:  Normal mood and affect   ASSESSMENT:   Francisco [name] is a 77 y.o. male who presents for the following: No diagnosis found.  PLAN:   There are no diagnoses linked to this encounter.  {Are you ordering a CV Procedure (e.g. stress test, cath, DCCV, TEE, etc)?   Press F2        :[id number]}  Disposition: No follow-ups on file.  Medication Adjustments/Labs and Tests Ordered: Current medicines are reviewed at length with the patient today.  Concerns regarding medicines are outlined above.  No orders of the defined types were placed in this encounter.  No orders of the defined types were placed in this encounter.   There are no Patient Instructions on file for this visit.   Time Spent with Patient: I have spent a total of *** minutes with patient reviewing hospital notes, telemetry, EKGs, labs and examining the patient as well as establishing an assessment and plan that was discussed with the patient.  > 50% of time was spent in direct patient care.  Signed, Addison Naegeli. Audie Box, MD, [place]  [address], [place],  [address] [phone]  08/21/2022 3:47 PM

## 2022-08-26 ENCOUNTER — Ambulatory Visit: Payer: Medicare Other | Admitting: Cardiovascular Disease

## 2022-08-26 DIAGNOSIS — E782 Mixed hyperlipidemia: Secondary | ICD-10-CM

## 2022-08-26 DIAGNOSIS — I2510 Atherosclerotic heart disease of native coronary artery without angina pectoris: Secondary | ICD-10-CM

## 2022-08-26 DIAGNOSIS — I5032 Chronic diastolic (congestive) heart failure: Secondary | ICD-10-CM

## 2022-08-26 DIAGNOSIS — I739 Peripheral vascular disease, unspecified: Secondary | ICD-10-CM

## 2022-08-27 ENCOUNTER — Encounter: Payer: Self-pay | Admitting: Internal Medicine

## 2022-08-28 ENCOUNTER — Telehealth: Payer: Self-pay | Admitting: Family Medicine

## 2022-08-28 NOTE — Telephone Encounter (Signed)
Called and spoke with Otila Kluver and VO given.

## 2022-08-28 NOTE — Telephone Encounter (Signed)
Home Health Verbal Orders  Agency:  [facility]  Caller: Earleen Newport and title  Requesting OT/ PT/ Skilled nursing/ Social Work/ Speech:    Reason for Request:  Nurse is doing general assistance, pulmonary assistance , monitor blood sugars, O2 safety medication management. Heart rate different parameter request.  Frequency:    HH needs F2F w/in last 30 days     [phone]

## 2022-08-30 ENCOUNTER — Other Ambulatory Visit: Payer: Self-pay | Admitting: Family Medicine

## 2022-08-30 MED ORDER — MUPIROCIN CALCIUM 2 % EX CREA: 1.0000 | TOPICAL_CREAM | Freq: Two times a day (BID) | CUTANEOUS | 0 refills | Status: DC

## 2022-08-30 NOTE — Telephone Encounter (Signed)
Team please call and encourage increased p.o. intake-if he does not get enough protein his vessels will get leakier and he will continue to swell.  I know he does not like Ensure or Carnation instant breakfast but even doing once a day would be helpful - Francisco [name] also request that he be worked in sooner than March 6-you can use a same-day slot for next week/virtual spot

## 2022-08-30 NOTE — Telephone Encounter (Signed)
Called and lm for pt tcb, Admin when pt calls back OR you can apptemt to try him back regarding scheduling sooner than 03/06 then transfer to me for the above message.

## 2022-08-30 NOTE — Telephone Encounter (Signed)
See below

## 2022-08-30 NOTE — Telephone Encounter (Signed)
Francisco [name] with [facility] states She admitted Patient on 08/28/21 - right ankle measurement 26 cm, left ankle measurement 28cm.   Weight 167.6  Blood pressure 124/78  Today 08/30/22 both ankles have increased by 2cm Right 28cm, left 30 cm  Weight is down 2 lbs-Today (08/30/22 weight is 166.1)  08/30/22  Blood pressure 108/84  Toes on both feet are purple and cold  Very poor intakes.  Francisco [name] requests to be called asap at ph# [phone]

## 2022-09-02 NOTE — Progress Notes (Unsigned)
Cardiology Office Note:   Date:  09/03/2022  NAME:  Francisco [name]    MRN: [id number] DOB:  Dec 11, 1945   PCP:  [name], MD  Cardiologist:  None  Electrophysiologist:  None   Referring MD: [name], MD   Chief Complaint  Patient presents with   New Patient (Initial Visit)   Edema    History of Present Illness:   Francisco [name] is a 77 y.o. male with a hx of COPD, HFpEF, lung cancer status post radiation therapy, PE on Eliquis, GI bleed who is being seen today for the evaluation of HFpEF at the request of [name], MD.  Admitted to the hospital in early February for upper GI bleed with anemia.  He did have an EGD that showed a Dula Foy lesion.  These were treated endoscopically.  Hemoglobin was 10 at the day of discharge. He was noted to have lower extremity edema.  Echocardiogram showed normal LV function and normal RVSP.  His IVC was collapsing.  He was told he had congestive heart failure and to follow-up with [place] today.  Weights continue to decline.  He is taking Lasix daily.  He has evidence of venous insufficiency with skin changes consistent with likely the early stages of lymphedema.  He has a known history of PAD status post bilateral common femoral endarterectomy. He is currently an active.  His albumin level was low in the hospital.  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 He is on Eliquis for history of PE.  2.5 mg twice daily.  This appears to be prophylactic dosing. He has never had a heart attack.  Coronary calcifications evident on chest CT. EKG does show sinus tachycardia.  He tells me his heart rate is always fast.  Suspect this is a combination of COPD and chronic anemia. He reports no chest pain or pressure today in office.  He presents with his wife.  He no longer smokes.  He drinks 4-6 beers per day.  No evidence of cirrhosis but I do not believe this is helping.  LDL cholesterol not at goal.  We discussed increasing his Crestor.  Blood pressure  is stable today.  He does need recheck on labs.  Denies chest pain.  Breathing is stable.   Problem List COPD -severe/pHTN Lung CA -Stage I s/p SBRT Duodenal AVM HFpEF PE Coronary calcifications on chest CT HLD -T chol 212, HDL 109, LDL 86, TG 78 (EtOH abuse) 8. PAD -R/L CFA endarterectomy  9. Tachycardia  10. Etoh abuse   Past Medical History: Past Medical History:  Diagnosis Date   Chronic back pain    "mid and lower" (04/07/2018)   Chronic rhinitis    -Sinus Ct 08/01/2009 >> Bilateral maxillary sinusitis with some mucosal thickeningin the sphenoid and frontal sinuses as well with air fluid levels present -chronic rhinitis flyer Aug 04, 2009   Compressed spine fracture [facility])    COPD (chronic obstructive pulmonary disease) (HCC)    PFT's rec Jul 17, 2009   Dyspnea    Emphysema lung ([place])    On home oxygen therapy    "3L; 24/7" (04/07/2018)   Onychomycosis    Dr. Judi Cong   Orthostatic hypotension    "since 10/2017" (04/07/2018)   PAD (peripheral artery disease) ([place])    Pneumonia    "twice in 1 year" (04/07/2018)   Pulmonary embolism ([place]) 04/07/2018   Skin cancer    "lips, face, ears, arms" (04/07/2018)  Small cell lung cancer, right ([place]) 2023   Dr. Lisbeth Renshaw.  rad   Vertigo    "since ~ 02/2018" (04/07/2018)    Past Surgical History: Past Surgical History:  Procedure Laterality Date   ABDOMINAL AORTOGRAM W/LOWER EXTREMITY N/A 01/21/2020   Procedure: ABDOMINAL AORTOGRAM W/LOWER EXTREMITY;  Surgeon: Elam Dutch, MD;  Location: [place] [facility];  Service: Cardiovascular;  Laterality: N/A;   APPENDECTOMY     BIOPSY  02/20/2020   Procedure: BIOPSY;  Surgeon: Rush Landmark Telford Nab., MD;  Location: [place];  Service: Gastroenterology;;   BIOPSY  08/12/2022   Procedure: BIOPSY;  Surgeon: Lavena Bullion, DO;  Location: [facility];  Service: Gastroenterology;;   BRONCHIAL BIOPSY  09/04/2021   Procedure: BRONCHIAL BIOPSIES;  Surgeon: Garner Nash, DO;   Location: [place] [facility];  Service: Pulmonary;;   BRONCHIAL BRUSHINGS  09/04/2021   Procedure: BRONCHIAL BRUSHINGS;  Surgeon: Garner Nash, DO;  Location: [place];  Service: Pulmonary;;   BRONCHIAL NEEDLE ASPIRATION BIOPSY  09/04/2021   Procedure: BRONCHIAL NEEDLE ASPIRATION BIOPSIES;  Surgeon: Garner Nash, DO;  Location: [place];  Service: Pulmonary;;   CATARACT EXTRACTION, BILATERAL     ENDARTERECTOMY FEMORAL Right 04/19/2019   Procedure: ENDARTERECTOMY RIGHT COMMON FEMORAL;  Surgeon: Elam Dutch, MD;  Location: [facility] OR;  Service: Vascular;  Laterality: Right;   ENDARTERECTOMY FEMORAL Left 01/24/2020   Procedure: LEFT FEMORAL ENDARTERECTOMY WITH DACRON PATCH ANGIOPLASTY;  Surgeon: Elam Dutch, MD;  [facility];  Service: Vascular;  Laterality: Left;   ESOPHAGOGASTRODUODENOSCOPY (EGD) WITH PROPOFOL N/A 02/20/2020   Procedure: ESOPHAGOGASTRODUODENOSCOPY (EGD) WITH PROPOFOL;  Surgeon: Irving Copas., MD;  Location: [place];  Service: Gastroenterology;  Laterality: N/A;   ESOPHAGOGASTRODUODENOSCOPY (EGD) WITH PROPOFOL N/A 08/12/2022   Procedure: ESOPHAGOGASTRODUODENOSCOPY (EGD) WITH PROPOFOL;  Surgeon: Lavena Bullion, DO;  Location: [place];  Service: Gastroenterology;  Laterality: N/A;   FEMORAL ENDARTERECTOMY Left 01/24/2020   FIDUCIAL MARKER PLACEMENT  09/04/2021   Procedure: FIDUCIAL MARKER PLACEMENT;  Surgeon: Garner Nash, DO;  Location: [place] [facility];  Service: Pulmonary;;   HEMOSTASIS CLIP PLACEMENT  02/20/2020   Procedure: HEMOSTASIS CLIP PLACEMENT;  Surgeon: Irving Copas., MD;  Location: [place];  Service: Gastroenterology;;   HEMOSTASIS CLIP PLACEMENT  08/12/2022   Procedure: HEMOSTASIS CLIP PLACEMENT;  Surgeon: Lavena Bullion, DO;  Location: [place];  Service: Gastroenterology;;   HOT HEMOSTASIS N/A 02/20/2020   Procedure: HOT HEMOSTASIS (ARGON PLASMA COAGULATION/BICAP);  Surgeon: Irving Copas., MD;   Location: [place];  Service: Gastroenterology;  Laterality: N/A;   HOT HEMOSTASIS N/A 08/12/2022   Procedure: HOT HEMOSTASIS (ARGON PLASMA COAGULATION/BICAP);  Surgeon: Lavena Bullion, DO;  Location: [facility];  Service: Gastroenterology;  Laterality: N/A;   INSERTION OF ILIAC STENT Left 01/24/2020   Procedure: INSERTION OF VBX STENT 8X59 AND 8X39 LEFT COMMON ILIAC ARTERY. INSERTION OF INNOVA 7 X 60 INNOVA STENT LEFT EXTERNAL ILIAC ARTERY. ;  Surgeon: Elam Dutch, MD;  Location: [place];  Service: Vascular;  Laterality: Left;   LOWER EXTREMITY ANGIOGRAPHY  12/11/2018   LOWER EXTREMITY ANGIOGRAPHY N/A 12/11/2018   Procedure: LOWER EXTREMITY ANGIOGRAPHY;  Surgeon: Elam Dutch, MD;  Location: [place] [facility];  Service: Cardiovascular;  Laterality: N/A;   PATCH ANGIOPLASTY Right 04/19/2019   Procedure: Patch Angioplasty;  Surgeon: Elam Dutch, MD;  Location: [facility] OR;  Service: Vascular;  Laterality: Right;   PERIPHERAL VASCULAR INTERVENTION Right 12/11/2018   Procedure: PERIPHERAL VASCULAR INTERVENTION;  Surgeon: Elam Dutch, MD;  Location: [place] [facility];  Service: Cardiovascular;  Laterality: Right;  Common Iliac    SKIN CANCER EXCISION     "lips, face, ears, arms" (04/07/2018)   SUBMUCOSAL INJECTION  08/12/2022   Procedure: SUBMUCOSAL INJECTION;  Surgeon: Lavena Bullion, DO;  Location: [place];  Service: Gastroenterology;;   TONSILLECTOMY     ULTRASOUND GUIDANCE FOR VASCULAR ACCESS Right 01/24/2020   Procedure: ULTRASOUND GUIDANCE FOR VASCULAR ACCESS;  Surgeon: Elam Dutch, MD;  Location: [place];  Service: Vascular;  Laterality: Right;   VIDEO BRONCHOSCOPY WITH RADIAL ENDOBRONCHIAL ULTRASOUND  09/04/2021   Procedure: VIDEO BRONCHOSCOPY WITH RADIAL ENDOBRONCHIAL ULTRASOUND;  Surgeon: Garner Nash, DO;  Location: [facility];  Service: Pulmonary;;    Current Medications: Current Meds  Medication Sig   acetaminophen (TYLENOL) 500 MG tablet Take 500  mg by mouth every 4 (four) hours as needed for moderate pain, fever, headache or mild pain.   albuterol (PROVENTIL) (2.5 MG/3ML) 0.083% nebulizer solution Take 3 mLs (2.5 mg total) by nebulization every 6 (six) hours as needed for wheezing or shortness of breath.   Calcium Carbonate-Vitamin D (CALCIUM 600+D PO) Take 2 tablets by mouth daily.   docusate sodium (COLACE) 100 MG capsule Take 2 capsules (200 mg total) by mouth 2 (two) times daily.   ELIQUIS 2.5 MG TABS tablet TAKE 1 TABLET BY MOUTH TWICE  DAILY (Patient taking differently: Take 2.5 mg by mouth 2 (two) times daily.)   Ferrous Sulfate (IRON) 325 (65 Fe) MG TABS Take 325 mg by mouth every other day.   fluticasone (FLONASE) 50 MCG/ACT nasal spray USE 2 SPRAYS IN BOTH  NOSTRILS DAILY (Patient taking differently: Place 2 sprays into both nostrils daily.)   folic acid (FOLVITE) 1 MG tablet Take 1 tablet (1 mg total) by mouth daily.   furosemide (LASIX) 20 MG tablet Take 1 tablet (20 mg total) by mouth daily as needed for fluid or edema.   Multiple Vitamin (MULTIVITAMIN WITH MINERALS) TABS tablet Take 1 tablet by mouth daily.   mupirocin cream (BACTROBAN) 2 % Apply 1 Application topically 2 (two) times daily.   oxyCODONE (OXY IR/ROXICODONE) 5 MG immediate release tablet Take 5 mg by mouth every 4 (four) hours as needed for severe pain.   OXYGEN Inhale 4-5 L into the lungs continuous.    pantoprazole (PROTONIX) 20 MG tablet TAKE 1 TABLET BY MOUTH  DAILY   Polyethyl Glycol-Propyl Glycol (SYSTANE) 0.4-0.3 % SOLN Place 1 drop into both eyes every other day.   predniSONE (DELTASONE) 10 MG tablet Please take 40 mg oral daily x 2 days, then 30 mg oral daily x 2 days, then 20 mg oral daily x 2 days, then back on home dose of 10 mg oral daily thereafter   roflumilast (DALIRESP) 500 MCG TABS tablet TAKE 1 TABLET BY MOUTH  DAILY (Patient taking differently: Take 500 mcg by mouth daily.)   SPIRIVA RESPIMAT 2.5 MCG/ACT AERS USE 2 INHALATIONS BY MOUTH  DAILY  (Patient taking differently: Inhale 2 each into the lungs daily.)   SYMBICORT 160-4.5 MCG/ACT inhaler USE 2 INHALATIONS BY MOUTH  TWICE DAILY (Patient taking differently: Inhale 2 puffs into the lungs 2 (two) times daily.)   traMADol (ULTRAM) 50 MG tablet Take 50 mg by mouth every 12 (twelve) hours as needed for severe pain.   [DISCONTINUED] albuterol (VENTOLIN HFA) 108 (90 Base) MCG/ACT inhaler USE 2 INHALATIONS BY MOUTH  EVERY 6 HOURS AS NEEDED FOR WHEEZING (  Patient taking differently: Inhale 2 puffs into the lungs every 6 (six) hours as needed for wheezing.)   [DISCONTINUED] azithromycin (ZITHROMAX) 250 MG tablet Take 1 tablet (250 mg total) by mouth 3 (three) times a week. (Patient taking differently: Take 250 mg by mouth every Monday, Wednesday, and Friday.)   [DISCONTINUED] guaiFENesin (MUCINEX) 600 MG 12 hr tablet Take 2 tablets (1,200 mg total) by mouth 2 (two) times daily.   [DISCONTINUED] metoprolol tartrate (LOPRESSOR) 25 MG tablet Take 1 tablet (25 mg total) by mouth 2 (two) times daily.   [DISCONTINUED] midodrine (PROAMATINE) 2.5 MG tablet Take 1 tablet (2.5 mg total) by mouth 2 (two) times daily with a meal.   [DISCONTINUED] rosuvastatin (CRESTOR) 10 MG tablet TAKE 1 TABLET BY MOUTH DAILY (Patient taking differently: Take 10 mg by mouth daily.)     Allergies:    Tape, Augmentin [amoxicillin-pot clavulanate], Cipro [ciprofloxacin hcl], Gadavist [gadobutrol], Levaquin [levofloxacin], and Lipitor [atorvastatin]   Social History: Social History   Socioeconomic History   Marital status: Married    Spouse name: Not on file   Number of children: 2   Years of education: 3 years of college   Highest education level: Not on file  Occupational History    Employer: [employer]  Tobacco Use   Smoking status: Former    Packs/day: 2.00    Years: 52.00    Total pack years: 104.00    Types: Pipe, Cigarettes    Start date: 10/06/1965    Quit date: 10/06/2017    Years since quitting: 4.9    Smokeless tobacco: Never   Tobacco comments:    04/07/2018 "smoked cigarettes years ago; stopped ~ 20 yr ago; stopped smoking pipe in 09/2017"  Vaping Use   Vaping Use: Never used  Substance and Sexual Activity   Alcohol use: Yes    Alcohol/week: 35.0 standard drinks of alcohol    Types: 35 Cans of beer per week   Drug use: No   Sexual activity: Not Currently  Other Topics Concern   Not on file  Social History Narrative   Married (wife Katy Apo patient of Dr. Yong Channel) with children (2-son and daughter). 1 grandson.       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       Hobbies: fishing, projects at home   Social Determinants of Radio broadcast assistant Strain: Not on file  Food Insecurity: Not on file  Transportation Needs: Not on file  Physical Activity: Not on file  Stress: Not on file  Social Connections: Not on file     Family History: The patient's family history includes Cancer in his mother; Coronary artery disease in an other family member; Heart disease in his father and mother; Hepatitis in his sister; Stroke in his father. There is no history of Colon cancer, Pancreatic cancer, Esophageal cancer, Inflammatory bowel disease, Liver disease, Rectal cancer, or Stomach cancer.  ROS:   All other ROS reviewed and negative. Pertinent positives noted in the HPI.     EKGs/Labs/Other Studies Reviewed:   The following studies were personally reviewed by me today:  EKG:  EKG is ordered today.  The ekg ordered today demonstrates sinus tachycardia heart rate 125, left anterior fascicular block, and was personally reviewed by me.   TTE 08/10/2022  1. Left ventricular ejection fraction, by estimation, is 55 to 60%. The  left ventricle has normal function. The left ventricle has no regional  wall motion abnormalities. Left  ventricular diastolic function could not  be evaluated.   2. Right ventricular systolic function is normal. The right ventricular   size is normal.   3. A small pericardial effusion is present.   4. No evidence of mitral valve regurgitation.   5. The aortic valve was not well visualized. Aortic valve regurgitation  is not visualized.   6. Not well visualized.   7. The inferior vena cava is normal in size with greater than 50%  respiratory variability, suggesting right atrial pressure of 3 mmHg.   Recent Labs: 08/10/2022: ALT 16; TSH 2.962 08/14/2022: B Natriuretic Peptide 143.2; Magnesium 2.1 08/16/2022: BUN 21; Creatinine, Ser 1.18; Potassium 4.9; Sodium 136 08/17/2022: Hemoglobin 10.0; Platelets 212   Recent Lipid Panel    Component Value Date/Time   CHOL 212 (H) 12/10/2021 0849   TRIG 78.0 12/10/2021 0849   HDL 109.70 12/10/2021 0849   CHOLHDL 2 12/10/2021 0849   VLDL 15.6 12/10/2021 0849   LDLCALC 86 12/10/2021 0849   LDLDIRECT 72.0 12/07/2020 0848    Physical Exam:   VS:  BP 128/80 (BP Location: Left Arm, Patient Position: Sitting, Cuff Size: Normal)   Pulse (!) 125   Ht 6' (1.829 m)   Wt 166 lb (75.3 kg)   BMI 22.51 kg/m    Wt Readings from Last 3 Encounters:  09/03/22 166 lb (75.3 kg)  08/17/22 194 lb 14.2 oz (88.4 kg)  06/06/22 182 lb (82.6 kg)    General: Well nourished, well developed, in no acute distress Head: Atraumatic, normal size  Eyes: PEERLA, EOMI  Neck: Supple, no JVD Endocrine: No thryomegaly Cardiac: Normal S1, S2; RRR; no murmurs, rubs, or gallops Lungs: Clear to auscultation bilaterally, no wheezing, rhonchi or rales  Abd: Soft, nontender, no hepatomegaly  Ext: 2+ pitting edema, evidence of venous insufficiency, early stages of lymphedema Musculoskeletal: No deformities, BUE and BLE strength normal and equal Skin: Warm and dry, no rashes   Neuro: Alert and oriented to person, place, time, and situation, CNII-XII grossly intact, no focal deficits  Psych: Normal mood and affect   ASSESSMENT:   TRASHAUN MULKINS [name] is a 77 y.o. male who presents for the following: 1. Venous  insufficiency   2. PAD (peripheral artery disease) ([place])   3. Chronic diastolic heart failure ([place])   4. Coronary artery calcification seen on computed tomography   5. Mixed hyperlipidemia   6. Tachycardia     PLAN:   1. Venous insufficiency -Weights continue to decline despite use of Lasix.  Examination is consistent with venous insufficiency.  Also with a known history of PAD.  He will need to get back in with vascular surgery given history of common femoral endarterectomy bilaterally.  They can then pursue venous ultrasounds as well.  It would be beneficial to diagnose venous reflux. -We discussed wrapping his legs as well as elevation.  We also discussed taking Lasix daily.  This will help.  Lasix is a Band-Aid. -To me, this is not represent diastolic heart failure.  I believe this is most evident as his BNP values were very minimally elevated in the hospital.  His IVC is collapsing.  All parameters including normal RVSP point to noncardiac etiology for his edema. -His albumin is a bit low as well.  With heavy alcohol use.  Not helping.  2. PAD (peripheral artery disease) (HCC) -Status post bilateral common femoral endarterectomies.  ABIs in 2022 were normal.  We will have him follow-up with vascular surgery.  Remains on  low-dose Eliquis for PE prophylaxis.  This will also help his PAD.  I would like to increase his Crestor to 20 mg daily.  His goal LDL cholesterol should be less than 55.  Most recent LDL 86.  HDL is very high in the setting of alcohol abuse.  3. Chronic diastolic heart failure (HCC) -Symptoms are more consistent with venous insufficiency.  We will continue with management conservatively as above.  4. Coronary artery calcification seen on computed tomography 5. Mixed hyperlipidemia -Coronary calcification seen on chest CT.  No symptoms of angina.  LV function is normal.  Suspect he has some element of underlying CAD.  Known history of PAD.  He has ongoing alcohol abuse and  end-stage COPD.  We will manage this conservatively.  No symptoms to suggest angina.  Increase Crestor to 20 mg daily.  Goal LDL cholesterol less than 55.  6. Tachycardia -Sinus tachycardia.  Longstanding issue.  He is anemic.  He has severe end-stage COPD.  This is contributing.  Recent TSH is normal.  On Eliquis.  Do not suspect PE.  No need for treatment.   Disposition: Return in about 6 months (around 03/04/2023).  Medication Adjustments/Labs and Tests Ordered: Current medicines are reviewed at length with the patient today.  Concerns regarding medicines are outlined above.  Orders Placed This Encounter  Procedures   Comprehensive metabolic panel   CBC   Ambulatory referral to Vascular Surgery   EKG 12-Lead   Meds ordered this encounter  Medications   rosuvastatin (CRESTOR) 20 MG tablet    Sig: Take 1 tablet (20 mg total) by mouth daily.    Dispense:  90 tablet    Refill:  3    Please send a replace/new response with 90-Day Supply if appropriate to maximize member benefit. Requesting 1 year supply.    Patient Instructions  Medication Instructions:  Increase Crestor to 20 mg daily   Continue taking Lasix 20 mg daily   *If you need a refill on your cardiac medications before your next appointment, please call your pharmacy*   Lab Work: CBC, CMET today   If you have labs (blood work) drawn today and your tests are completely normal, you will receive your results only by: [place] (if you have MyChart) OR A paper copy in the mail If you have any lab test that is abnormal or we need to change your treatment, we will call you to review the results.   Follow-Up: At [facility], you and your health needs are our priority.  As part of our continuing mission to provide you with exceptional heart care, we have created designated Provider Care Teams.  These Care Teams include your primary Cardiologist (physician) and Advanced Practice Providers (APPs -  Physician  Assistants and Nurse Practitioners) who all work together to provide you with the care you need, when you need it.  We recommend signing up for the patient portal called "MyChart".  Sign up information is provided on this After Visit Summary.  MyChart is used to connect with patients for Virtual Visits (Telemedicine).  Patients are able to view lab/test results, encounter notes, upcoming appointments, etc.  Non-urgent messages can be sent to your provider as well.   To learn more about what you can do with MyChart, go to [email or web address].    Your next appointment:   6 month(s)  Provider:   Eleonore Chiquito, MD    Other Instructions  Referral to Dr. Unk Lightning (Vein and Vascular)- they will  contact you for an appointment.   Chronic Venous Insufficiency Chronic venous insufficiency is a condition where the leg veins cannot effectively pump blood from the legs to the heart. This happens when the vein walls are either stretched, weakened, or damaged, or when the valves inside the vein are damaged. With the right treatment, you should be able to continue with an active life. This condition is also called venous stasis. What are the causes? Common causes of this condition include: High blood pressure inside the veins (venous hypertension). Sitting or standing too long, causing increased blood pressure in the leg veins. A blood clot that blocks blood flow in a vein (deep vein thrombosis, DVT). Inflammation of a vein (phlebitis) that causes a blood clot to form. Tumors in the pelvis that cause blood to back up. What increases the risk? The following factors may make you more likely to develop this condition: Having a family history of this condition. Obesity. Pregnancy. Living without enough regular physical activity or exercise (sedentary lifestyle). Smoking. Having a job that requires long periods of standing or sitting in one place. Being a certain age. Women in their 68s and 53s and  men in their 66s are more likely to develop this condition. What are the signs or symptoms? Symptoms of this condition include: Veins that are enlarged, bulging, or twisted (varicose veins). Skin breakdown or ulcers. Reddened skin or dark discoloration of skin on the leg between the knee and ankle. Brown, smooth, tight, and painful skin just above the ankle, usually on the inside of the leg (lipodermatosclerosis). Swelling of the legs. How is this diagnosed? This condition may be diagnosed based on: Your medical history. A physical exam. Tests, such as: A procedure that creates an image of a blood vessel and nearby organs and provides information about blood flow through the blood vessel (duplex ultrasound). A procedure that tests blood flow (plethysmography). A procedure that looks at the veins using X-ray and dye (venogram). How is this treated? The goals of treatment are to help you return to an active life and to minimize pain or disability. Treatment depends on the severity of your condition, and it may include: Wearing compression stockings. These can help relieve symptoms and help prevent your condition from getting worse. However, they do not cure the condition. Sclerotherapy. This procedure involves an injection of a solution that shrinks damaged veins. Surgery. This may involve: Removing a diseased vein (vein stripping). Cutting off blood flow through the vein (laser ablation surgery). Repairing or reconstructing a valve within the affected vein. Follow these instructions at home:     Wear compression stockings as told by your health care provider. These stockings help to prevent blood clots and reduce swelling in your legs. Take over-the-counter and prescription medicines only as told by your health care provider. Stay active by exercising, walking, or doing different activities. Ask your health care provider what activities are safe for you and how much exercise you  need. Drink enough fluid to keep your urine pale yellow. Do not use any products that contain nicotine or tobacco, such as cigarettes, e-cigarettes, and chewing tobacco. If you need help quitting, ask your health care provider. Keep all follow-up visits as told by your health care provider. This is important. Contact a health care provider if you: Have redness, swelling, or more pain in the affected area. See a red streak or line that goes up or down from the affected area. Have skin breakdown or skin loss in the affected  area, even if the breakdown is small. Get an injury in the affected area. Get help right away if: You get an injury and an open wound in the affected area. You have: Severe pain that does not get better with medicine. Sudden numbness or weakness in the foot or ankle below the affected area. Trouble moving your foot or ankle. A fever. Worse or persistent symptoms. Chest pain. Shortness of breath. Summary Chronic venous insufficiency is a condition where the leg veins cannot effectively pump blood from the legs to the heart. Chronic venous insufficiency occurs when the vein walls become stretched, weakened, or damaged, or when valves within the vein are damaged. Treatment depends on how severe your condition is. It often involves wearing compression stockings and may involve having a procedure. Make sure you stay active by exercising, walking, or doing different activities. Ask your health care provider what activities are safe for you and how much exercise you need. This information is not intended to replace advice given to you by your health care provider. Make sure you discuss any questions you have with your health care provider. Document Revised: 09/04/2020 Document Reviewed: 09/05/2020 Elsevier Patient Education  [place], [place] T. Audie Box, MD, [place]  [address] [place], [place] [address] [phone]  09/03/2022 1:52 PM

## 2022-09-03 ENCOUNTER — Encounter: Payer: Self-pay | Admitting: Cardiovascular Disease

## 2022-09-03 ENCOUNTER — Ambulatory Visit: Payer: Medicare Other | Attending: Cardiovascular Disease | Admitting: Cardiovascular Disease

## 2022-09-03 VITALS — BP 128/80 | HR 125 | Ht 72.0 in | Wt 166.0 lb

## 2022-09-03 DIAGNOSIS — E782 Mixed hyperlipidemia: Secondary | ICD-10-CM

## 2022-09-03 DIAGNOSIS — I739 Peripheral vascular disease, unspecified: Secondary | ICD-10-CM | POA: Diagnosis not present

## 2022-09-03 DIAGNOSIS — I872 Venous insufficiency (chronic) (peripheral): Secondary | ICD-10-CM | POA: Diagnosis not present

## 2022-09-03 DIAGNOSIS — I5032 Chronic diastolic (congestive) heart failure: Secondary | ICD-10-CM

## 2022-09-03 DIAGNOSIS — I2510 Atherosclerotic heart disease of native coronary artery without angina pectoris: Secondary | ICD-10-CM

## 2022-09-03 DIAGNOSIS — R000 Tachycardia, unspecified: Secondary | ICD-10-CM

## 2022-09-03 MED ORDER — ROSUVASTATIN CALCIUM 20 MG PO TABS: 20.0000 mg | ORAL_TABLET | Freq: Every day | ORAL | 3 refills | Status: DC

## 2022-09-03 NOTE — Patient Instructions (Addendum)
Medication Instructions:  Increase Crestor to 20 mg daily   Continue taking Lasix 20 mg daily   *If you need a refill on your cardiac medications before your next appointment, please call your pharmacy*   Lab Work: CBC, CMET today   If you have labs (blood work) drawn today and your tests are completely normal, you will receive your results only by: [place] (if you have MyChart) OR A paper copy in the mail If you have any lab test that is abnormal or we need to change your treatment, we will call you to review the results.   Follow-Up: At [facility], you and your health needs are our priority.  As part of our continuing mission to provide you with exceptional heart care, we have created designated Provider Care Teams.  These Care Teams include your primary Cardiologist (physician) and Advanced Practice Providers (APPs -  Physician Assistants and Nurse Practitioners) who all work together to provide you with the care you need, when you need it.  We recommend signing up for the patient portal called "MyChart".  Sign up information is provided on this After Visit Summary.  MyChart is used to connect with patients for Virtual Visits (Telemedicine).  Patients are able to view lab/test results, encounter notes, upcoming appointments, etc.  Non-urgent messages can be sent to your provider as well.   To learn more about what you can do with MyChart, go to [email or web address].    Your next appointment:   6 month(s)  Provider:   Eleonore Chiquito, MD    Other Instructions  Referral to Dr. Unk Lightning ([facility])- they will contact you for an appointment.   Chronic Venous Insufficiency Chronic venous insufficiency is a condition where the leg veins cannot effectively pump blood from the legs to the heart. This happens when the vein walls are either stretched, weakened, or damaged, or when the valves inside the vein are damaged. With the right treatment, you should be  able to continue with an active life. This condition is also called venous stasis. What are the causes? Common causes of this condition include: High blood pressure inside the veins (venous hypertension). Sitting or standing too long, causing increased blood pressure in the leg veins. A blood clot that blocks blood flow in a vein (deep vein thrombosis, DVT). Inflammation of a vein (phlebitis) that causes a blood clot to form. Tumors in the pelvis that cause blood to back up. What increases the risk? The following factors may make you more likely to develop this condition: Having a family history of this condition. Obesity. Pregnancy. Living without enough regular physical activity or exercise (sedentary lifestyle). Smoking. Having a job that requires long periods of standing or sitting in one place. Being a certain age. Women in their 69s and 72s and men in their 61s are more likely to develop this condition. What are the signs or symptoms? Symptoms of this condition include: Veins that are enlarged, bulging, or twisted (varicose veins). Skin breakdown or ulcers. Reddened skin or dark discoloration of skin on the leg between the knee and ankle. Brown, smooth, tight, and painful skin just above the ankle, usually on the inside of the leg (lipodermatosclerosis). Swelling of the legs. How is this diagnosed? This condition may be diagnosed based on: Your medical history. A physical exam. Tests, such as: A procedure that creates an image of a blood vessel and nearby organs and provides information about blood flow through the blood vessel (duplex ultrasound). A  procedure that tests blood flow (plethysmography). A procedure that looks at the veins using X-ray and dye (venogram). How is this treated? The goals of treatment are to help you return to an active life and to minimize pain or disability. Treatment depends on the severity of your condition, and it may include: Wearing compression  stockings. These can help relieve symptoms and help prevent your condition from getting worse. However, they do not cure the condition. Sclerotherapy. This procedure involves an injection of a solution that shrinks damaged veins. Surgery. This may involve: Removing a diseased vein (vein stripping). Cutting off blood flow through the vein (laser ablation surgery). Repairing or reconstructing a valve within the affected vein. Follow these instructions at home:     Wear compression stockings as told by your health care provider. These stockings help to prevent blood clots and reduce swelling in your legs. Take over-the-counter and prescription medicines only as told by your health care provider. Stay active by exercising, walking, or doing different activities. Ask your health care provider what activities are safe for you and how much exercise you need. Drink enough fluid to keep your urine pale yellow. Do not use any products that contain nicotine or tobacco, such as cigarettes, e-cigarettes, and chewing tobacco. If you need help quitting, ask your health care provider. Keep all follow-up visits as told by your health care provider. This is important. Contact a health care provider if you: Have redness, swelling, or more pain in the affected area. See a red streak or line that goes up or down from the affected area. Have skin breakdown or skin loss in the affected area, even if the breakdown is small. Get an injury in the affected area. Get help right away if: You get an injury and an open wound in the affected area. You have: Severe pain that does not get better with medicine. Sudden numbness or weakness in the foot or ankle below the affected area. Trouble moving your foot or ankle. A fever. Worse or persistent symptoms. Chest pain. Shortness of breath. Summary Chronic venous insufficiency is a condition where the leg veins cannot effectively pump blood from the legs to the  heart. Chronic venous insufficiency occurs when the vein walls become stretched, weakened, or damaged, or when valves within the vein are damaged. Treatment depends on how severe your condition is. It often involves wearing compression stockings and may involve having a procedure. Make sure you stay active by exercising, walking, or doing different activities. Ask your health care provider what activities are safe for you and how much exercise you need. This information is not intended to replace advice given to you by your health care provider. Make sure you discuss any questions you have with your health care provider. Document Revised: 09/04/2020 Document Reviewed: 09/05/2020 Elsevier Patient Education  [place].

## 2022-09-04 LAB — CBC
Hematocrit: 33.7 % — ABNORMAL LOW (ref 37.5–51.0)
Hemoglobin: 11.3 g/dL — ABNORMAL LOW (ref 13.0–17.7)
MCH: 30.6 pg (ref 26.6–33.0)
MCHC: 33.5 g/dL (ref 31.5–35.7)
MCV: 91 fL (ref 79–97)
Platelets: 225 10*3/uL (ref 150–450)
RBC: 3.69 x10E6/uL — ABNORMAL LOW (ref 4.14–5.80)
RDW: 12.5 % (ref 11.6–15.4)
WBC: 9.7 10*3/uL (ref 3.4–10.8)

## 2022-09-04 LAB — COMPREHENSIVE METABOLIC PANEL
ALT: 25 IU/L (ref 0–44)
AST: 26 IU/L (ref 0–40)
Albumin/Globulin Ratio: 1.8 (ref 1.2–2.2)
Albumin: 4 g/dL (ref 3.8–4.8)
Alkaline Phosphatase: 156 IU/L — ABNORMAL HIGH (ref 44–121)
BUN/Creatinine Ratio: 13 (ref 10–24)
BUN: 13 mg/dL (ref 8–27)
Bilirubin Total: 0.3 mg/dL (ref 0.0–1.2)
CO2: 29 mmol/L (ref 20–29)
Calcium: 9.6 mg/dL (ref 8.6–10.2)
Chloride: 98 mmol/L (ref 96–106)
Creatinine, Ser: 0.97 mg/dL (ref 0.76–1.27)
Globulin, Total: 2.2 g/dL (ref 1.5–4.5)
Glucose: 101 mg/dL — ABNORMAL HIGH (ref 70–99)
Potassium: 4.7 mmol/L (ref 3.5–5.2)
Sodium: 142 mmol/L (ref 134–144)
Total Protein: 6.2 g/dL (ref 6.0–8.5)
eGFR: 82 mL/min/{1.73_m2} (ref >59)

## 2022-09-11 ENCOUNTER — Ambulatory Visit (INDEPENDENT_AMBULATORY_CARE_PROVIDER_SITE_OTHER): Payer: Medicare Other | Admitting: Family Medicine

## 2022-09-11 ENCOUNTER — Encounter: Payer: Self-pay | Admitting: Family Medicine

## 2022-09-11 VITALS — BP 130/62 | HR 112 | Temp 98.2°F | Ht 72.0 in | Wt 168.0 lb

## 2022-09-11 DIAGNOSIS — S32000A Wedge compression fracture of unspecified lumbar vertebra, initial encounter for closed fracture: Secondary | ICD-10-CM | POA: Diagnosis not present

## 2022-09-11 DIAGNOSIS — E785 Hyperlipidemia, unspecified: Secondary | ICD-10-CM

## 2022-09-11 DIAGNOSIS — Z8719 Personal history of other diseases of the digestive system: Secondary | ICD-10-CM | POA: Diagnosis not present

## 2022-09-11 DIAGNOSIS — I2782 Chronic pulmonary embolism: Secondary | ICD-10-CM

## 2022-09-11 DIAGNOSIS — I5022 Chronic systolic (congestive) heart failure: Secondary | ICD-10-CM

## 2022-09-11 DIAGNOSIS — J449 Chronic obstructive pulmonary disease, unspecified: Secondary | ICD-10-CM | POA: Diagnosis not present

## 2022-09-11 DIAGNOSIS — I10 Essential (primary) hypertension: Secondary | ICD-10-CM

## 2022-09-11 MED ORDER — ROSUVASTATIN CALCIUM 20 MG PO TABS: 20.0000 mg | ORAL_TABLET | Freq: Every day | ORAL | 3 refills | Status: DC

## 2022-09-11 MED ORDER — DOXYCYCLINE HYCLATE 100 MG PO TABS: 100.0000 mg | ORAL_TABLET | Freq: Two times a day (BID) | ORAL | 0 refills | Status: AC

## 2022-09-11 MED ORDER — PREDNISONE 10 MG PO TABS: ORAL_TABLET | ORAL | 0 refills | Status: DC

## 2022-09-11 NOTE — Assessment & Plan Note (Signed)
Thankfully this has stabilized after treatment by GI through EGD/cauterization of suspected lesions. Looking at last GI note they did not recommend scheduling outpatient follow up. CBC obtained by cardiology and hgb improving- we opted to hold on labs today - can remain on eliquis but discussed potentially stopping in future if recurrent issues.

## 2022-09-11 NOTE — Assessment & Plan Note (Addendum)
Patient with lumbar compression fracture-has some chronic back pain and typically takes tramadol but has had significant worsening in pain after fall-typically taking oxycodone twice daily-I am willing to refill this but discussed within the next month we need to begin to try to wean back to tramadol-which she is also using at times between oxycodone doses (would not be surprised if he has some delayed healing with chronic prednisone) - He is going to go home and see how he feels he has remaining so we can determine when he will need his next refill-certainly do not want him to run out as providing some significant relief of pain - Prior more significant alcohol usage up to 35 a day reported-down to 1.5/day-ideally would have full cessation and certainly not use alcohol while on pain medication or at least within 6 hours-I wonder if this could have contributed to fall though we did not specifically discuss time course in relation to alcohol consumption

## 2022-09-11 NOTE — Patient Instructions (Addendum)
If not doing better on doxycycline and prednisone within a couple days - call [name]  Let me know how many pills you have left and with taking 2 a day that will help me plan   Glad bloodwork looked better - ok to take extra lasix 6 hours after initial when swelling increases   Recommended follow up: Return for as needed for new, worsening, persistent symptoms.

## 2022-09-11 NOTE — Assessment & Plan Note (Signed)
LDL over 70 and Dr. Davina Poke recently increased to 20 mg of rosuvastatin instead of 10 mg-he request that I sent this into mail order which I did today-consider repeat lipid level at follow-up

## 2022-09-11 NOTE — Assessment & Plan Note (Addendum)
Appears to be having acute exacerbation (despite reported exacerbation in hospital that was treated as well as at rehab)-increase sputum production and change in coloration to brown as well as increased wheezing-treat with prednisone taper that pulmonary has used and doxycycline for 7 days-encouraged follow-up with [name] if not improving

## 2022-09-11 NOTE — Assessment & Plan Note (Signed)
He was actually placed on midodrine in the hospital to try to help maintain blood pressure while on Lasix and metoprolol-he has not continue metoprolol outpatient nor midodrine but blood pressure seems to be maintained-patient reports Dr. Callie Fielding is okay with him remaining off of this medication though does have sinus tachycardia initially today-suspect related to COPD exacerbation but he tends to run high normal or mildly tachycardic at least-continue to monitor

## 2022-09-11 NOTE — Progress Notes (Signed)
Phone [phone] In person visit   Subjective:   Francisco [name] is a 77 y.o. year old very pleasant male patient who presents for/with See problem oriented charting Chief Complaint  Patient presents with   Follow-up    Pt is here to f/u after being discharged from rehab.   Hyperlipidemia    Wants to discuss Rosuvastatin    Hypertension    Would like to discuss lasix.   Past Medical History-  Patient Active Problem List   Diagnosis Date Noted   Lumbar compression fracture ([place]) 09/11/2022    Priority: High   Malignant neoplasm of lower lobe of right lung [facility])     Priority: High   Acute GI bleeding 02/18/2020    Priority: High   PAD (peripheral artery disease) ([place]) 12/11/2018    Priority: High   Stage 4 very severe COPD by GOLD classification ([place]) 11/27/2018    Priority: High   DNR (do not resuscitate) 04/21/2018    Priority: High   Chronic pulmonary embolism ([place]) 04/07/2018    Priority: High   Osteoporosis 11/19/2017    Priority: High   Thoracic compression fracture ([place]) 11/02/2017    Priority: High   Former smoker 08/23/2008    Priority: High   Iron deficiency anemia due to chronic blood loss 08/23/2020    Priority: Medium    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    Priority: Medium    History of upper GI bleeding 04/30/2020    Priority: Medium    Essential tremor 03/31/2018    Priority: Medium    BPPV (benign paroxysmal positional vertigo), right 02/10/2018    Priority: Medium    Essential hypertension 11/02/2017    Priority: Medium    BPH associated with nocturia 06/25/2017    Priority: Medium    Hyperglycemia 06/12/2016    Priority: Medium    Hyperlipidemia 07/22/2014    Priority: Medium    Pre-operative respiratory examination 12/10/2018    Priority: Low   Venous stasis dermatitis of both lower extremities 11/02/2017    Priority: Low   Chronic respiratory failure with hypoxia ([place]) 09/23/2017    Priority: Low   Leukocytosis  12/19/2016    Priority: Low   Onychomycosis 07/22/2014    Priority: Low   Left ankle swelling 07/22/2014    Priority: Low   History of skin cancer 05/17/2014    Priority: Low   Chronic rhinitis 10/28/2012    Priority: Low   Pulmonary nodule 09/23/2011    Priority: Low   History of colonic polyps 08/23/2008    Priority: Low   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   Gastritis and gastroduodenitis 08/12/2022   AVM (arteriovenous malformation) of duodenum, acquired 08/12/2022   Melena [id number]   Chronic systolic congestive heart failure ([place]) 12/10/2021   AVM (arteriovenous malformation) of small bowel, acquired 11/21/2020   Hiatal hernia 04/30/2020   Chronic anticoagulation 04/30/2020   Anemia [id number]   Diastolic dysfunction [id number]    Medications- reviewed and updated Current Outpatient Medications  Medication Sig Dispense Refill   acetaminophen (TYLENOL) 500 MG tablet Take 500 mg by mouth every 4 (four) hours as needed for moderate pain, fever, headache or mild pain.     albuterol (PROVENTIL) (2.5 MG/3ML) 0.083% nebulizer solution Take 3 mLs (2.5 mg total) by nebulization every 6 (six) hours as needed for wheezing or shortness of breath. 75 mL 12   Calcium Carbonate-Vitamin D (CALCIUM 600+D PO) Take 2 tablets by mouth daily.  docusate sodium (COLACE) 100 MG capsule Take 2 capsules (200 mg total) by mouth 2 (two) times daily. 10 capsule 0   doxycycline (VIBRA-TABS) 100 MG tablet Take 1 tablet (100 mg total) by mouth 2 (two) times daily for 7 days. 14 tablet 0   ELIQUIS 2.5 MG TABS tablet TAKE 1 TABLET BY MOUTH TWICE  DAILY (Patient taking differently: Take 2.5 mg by mouth 2 (two) times daily.) 180 tablet 3   Ferrous Sulfate (IRON) 325 (65 Fe) MG TABS Take 325 mg by mouth every other day.     fluticasone (FLONASE) 50 MCG/ACT nasal spray USE 2 SPRAYS IN BOTH  NOSTRILS DAILY (Patient taking differently: Place 2 sprays into both nostrils daily.) 48 g 3   folic acid  (FOLVITE) 1 MG tablet Take 1 tablet (1 mg total) by mouth daily.     furosemide (LASIX) 20 MG tablet Take 1 tablet (20 mg total) by mouth daily as needed for fluid or edema.     Multiple Vitamin (MULTIVITAMIN WITH MINERALS) TABS tablet Take 1 tablet by mouth daily.     mupirocin cream (BACTROBAN) 2 % Apply 1 Application topically 2 (two) times daily. 30 g 0   OXYGEN Inhale 4-5 L into the lungs continuous.      pantoprazole (PROTONIX) 20 MG tablet TAKE 1 TABLET BY MOUTH  DAILY 90 tablet 3   Polyethyl Glycol-Propyl Glycol (SYSTANE) 0.4-0.3 % SOLN Place 1 drop into both eyes every other day.     roflumilast (DALIRESP) 500 MCG TABS tablet TAKE 1 TABLET BY MOUTH  DAILY (Patient taking differently: Take 500 mcg by mouth daily.) 90 tablet 3   SPIRIVA RESPIMAT 2.5 MCG/ACT AERS USE 2 INHALATIONS BY MOUTH  DAILY (Patient taking differently: Inhale 2 each into the lungs daily.) 12 g 3   SYMBICORT 160-4.5 MCG/ACT inhaler USE 2 INHALATIONS BY MOUTH  TWICE DAILY (Patient taking differently: Inhale 2 puffs into the lungs 2 (two) times daily.) 30.6 g 3   oxyCODONE (OXY IR/ROXICODONE) 5 MG immediate release tablet Take 5 mg by mouth every 4 (four) hours as needed for severe pain. (Patient not taking: Reported on 09/11/2022)     predniSONE (DELTASONE) 10 MG tablet Please take 40 mg oral daily x 2 days, then 30 mg oral daily x 2 days, then 20 mg oral daily x 2 days, then back on home dose of 10 mg oral daily thereafter 20 tablet 0   rosuvastatin (CRESTOR) 20 MG tablet Take 1 tablet (20 mg total) by mouth daily. 90 tablet 3   traMADol (ULTRAM) 50 MG tablet Take 50 mg by mouth every 12 (twelve) hours as needed for severe pain. (Patient not taking: Reported on 09/11/2022)     No current facility-administered medications for this visit.     Objective:  BP 130/62   Pulse (!) 112   Temp 98.2 F (36.8 C)   Ht 6' (1.829 m)   Wt 168 lb (76.2 kg)   SpO2 92%   BMI 22.78 kg/m wearing home oxygen at 4 L I believe Gen: NAD,  resting comfortably CV: Slightly tachycardic but difficult to hear due to wheeze Lungs: Diffuse wheezing and some rhonchi Ext: no edema Skin: warm, dry    Assessment and Plan   # Hospital follow-up/rehab follow-up S: Patient was hospitalized from August 09, 2022 to August 17, 2022 after presenting to the hospital with dark stools and ongoing pain in his low back after a fall-he was found to have an L-spine compression fracture  as well as to be significantly anemic from an upper GI bleed.  He was heme positive on stools and saw GI on 08/12/2022 and had EGD-from discharge summary "Dieulafoy lesion of stomach located in the gastric fundus along with AVMs in duodenum, Spectre lesions were cauterized, Eliquis has been held for few days, it was resumed on 08/16/2022 as his hemoglobin remained stable, continue to monitor hemoglobin closely as an outpatient.   "  He remains on Eliquis at this time  He was seen by orthopedics about his compression fracture and placed in an LSO brace and referred to PT OT with plan for outpatient follow-up with Dr. [name]- was weaned off of narcotics  With his underlying COPD he was on oxygen 4 to 5 L at all times per his baseline.  Did have COPD exacerbation and treated with Rocephin, IV steroids, chest physiotherapy and discharged on a steroid taper as well as Keflex with plan to transition back to 10 mg baseline prednisone.  He remains on Daliresp and Spiriva and Symbicort  He was also fluid overloaded in the hospital with known diastolic CHF-initially was diuresed but then developed soft blood pressures without worsening fluid status.  He actually had to be maintained on low-dose midodrine to maintain blood pressure  Today reports regaining some strength but still only able to walk very short distances related to both pain and stamina. PT has not yet started at the home- first visit tomorrow and I can use this visit to sign for face to face orders.  -continued pain in  low back - pain 1-2/10 right now but took oxycodone to help with travel an hour ago. Taking twice a day and tramadol as well. Was taking tramadol only before the fall- wants to get back to that point. Does cause constipation- taking stool softener. They are going to count # and call me back.  - feels congestion increasing in last few days- sputum increasing and changed to brown color. Prednisone 10 mg - wheezing more. Had been treated with doxyxycline at [facility] place as well- right after gettignt out of hospital A/P:    Acute GI bleeding Thankfully this has stabilized after treatment by GI through EGD/cauterization of suspected lesions. Looking at last GI note they did not recommend scheduling outpatient follow up. CBC obtained by cardiology and hgb improving- we opted to hold on labs today - can remain on eliquis but discussed potentially stopping in future if recurrent issues.   Lumbar compression fracture [facility]) Patient with lumbar compression fracture-has some chronic back pain and typically takes tramadol but has had significant worsening in pain after fall-typically taking oxycodone twice daily-I am willing to refill this but discussed within the next month we need to begin to try to wean back to tramadol-which she is also using at times between oxycodone doses (would not be surprised if he has some delayed healing with chronic prednisone) - He is going to go home and see how he feels he has remaining so we can determine when he will need his next refill-certainly do not want him to run out as providing some significant relief of pain - Prior more significant alcohol usage up to 35 a day reported-down to 1.5/day-ideally would have full cessation and certainly not use alcohol while on pain medication or at least within 6 hours-I wonder if this could have contributed to fall though we did not specifically discuss time course in relation to alcohol consumption  Chronic pulmonary embolism (H[facility]) Back  on Eliquis  with hopes of prevention of recurrent pulmonary embolism with sedentary activity-plan has been lifelong therapy but we may have to readjust this course depending on if he has recurrent GI bleeds  Stage 4 very severe COPD by GOLD classification ([place]) Appears to be having acute exacerbation (despite reported exacerbation in hospital that was treated as well as at rehab)-increase sputum production and change in coloration to brown as well as increased wheezing-treat with prednisone taper that pulmonary has used and doxycycline for 7 days-encouraged follow-up with [name] if not improving  Essential hypertension He was actually placed on midodrine in the hospital to try to help maintain blood pressure while on Lasix and metoprolol-he has not continue metoprolol outpatient nor midodrine but blood pressure seems to be maintained-patient reports Dr. Callie Fielding is okay with him remaining off of this medication though does have sinus tachycardia initially today-suspect related to COPD exacerbation but he tends to run high normal or mildly tachycardic at least-continue to monitor  Chronic systolic congestive heart failure [facility]) Patient now on chronic Lasix 20 mg-Dr. Davina Poke does not think this was primarily CHF related but more due to venous blood flow issues-has recommended he see vascular surgery again which is scheduled.  Legs have been increasing in size some and wife may use twice a day to help prevent further increase short-term-I am okay with that  Hyperlipidemia LDL over 70 and Dr. Davina Poke recently increased to 20 mg of rosuvastatin instead of 10 mg-he request that I sent this into mail order which I did today-consider repeat lipid level at follow-up  Recommended follow up: Return for as needed for new, worsening, persistent symptoms. Future Appointments  Date Time Provider Reserve  10/18/2022 12:30 PM [facility] HS VASC 6 - [facility]  10/18/2022  1:30 PM [facility] PA [facility]   12/24/2022 10:00 AM [facility] LAB [facility] None  12/26/2022  3:15 PM Curt Bears, MD [facility] None  01/22/2023 11:00 AM MC-MR 1 [facility]MRI [facility]  01/27/2023  7:00 AM [facility]TUMOR BOARD CONFERENCE [facility] None  01/27/2023  1:30 PM Hayden Pedro, PA-C [facility] None    Lab/Order associations:   ICD-10-CM   1. History of GI bleed  Z87.19     2. Compression fracture of lumbar vertebra, unspecified lumbar vertebral level, initial encounter ([place])  S32.000A     3. Other chronic pulmonary embolism, unspecified whether acute cor pulmonale present (HCC)  I27.82     4. Stage 4 very severe COPD by GOLD classification ([place])  J44.9     5. Essential hypertension  I10     6. Chronic systolic congestive heart failure (HCC)  I50.22     7. Hyperlipidemia, unspecified hyperlipidemia type  E78.5       Meds ordered this encounter  Medications   predniSONE (DELTASONE) 10 MG tablet    Sig: Please take 40 mg oral daily x 2 days, then 30 mg oral daily x 2 days, then 20 mg oral daily x 2 days, then back on home dose of 10 mg oral daily thereafter    Dispense:  20 tablet    Refill:  0    Please send all further refill requests electronically only.   doxycycline (VIBRA-TABS) 100 MG tablet    Sig: Take 1 tablet (100 mg total) by mouth 2 (two) times daily for 7 days.    Dispense:  14 tablet    Refill:  0   rosuvastatin (CRESTOR) 20 MG tablet    Sig: Take 1 tablet (20 mg total)  by mouth daily.    Dispense:  90 tablet    Refill:  3    Please send a replace/new response with 90-Day Supply if appropriate to maximize member benefit. Requesting 1 year supply.    Time Spent: 45 minutes of total time (11:45 AM-12:30 PM) was spent on the date of the encounter performing the following actions: chart review prior to seeing the patient including summarizing hospitalization, obtaining history directly from patient and wife, performing a medically necessary exam, counseling on the treatment plan,  placing orders, and documenting in our EHR.    Return precautions advised.  Garret Reddish, MD

## 2022-09-11 NOTE — Assessment & Plan Note (Signed)
Back on Eliquis with hopes of prevention of recurrent pulmonary embolism with sedentary activity-plan has been lifelong therapy but we may have to readjust this course depending on if he has recurrent GI bleeds

## 2022-09-11 NOTE — Assessment & Plan Note (Signed)
Patient now on chronic Lasix 20 mg-Dr. Davina Poke does not think this was primarily CHF related but more due to venous blood flow issues-has recommended he see vascular surgery again which is scheduled.  Legs have been increasing in size some and wife may use twice a day to help prevent further increase short-term-I am okay with that

## 2022-09-12 MED ORDER — PREDNISONE 10 MG PO TABS: ORAL_TABLET | ORAL | 0 refills | Status: DC

## 2022-09-25 ENCOUNTER — Telehealth: Payer: Self-pay | Admitting: Family Medicine

## 2022-09-25 NOTE — Telephone Encounter (Signed)
.  Home Health Certification or Plan of Care Tracking  Is this a Certification or Plan of Care? Yes  [place] Agency: [place] Number:  [id number]  Has charge sheet been attached? Yes  Where has form been placed:  In provider's box  Faxed to:   [phone]

## 2022-09-30 NOTE — Telephone Encounter (Signed)
Forms have been faxed back.  

## 2022-10-02 ENCOUNTER — Other Ambulatory Visit: Payer: Self-pay | Admitting: Family Medicine

## 2022-10-08 ENCOUNTER — Other Ambulatory Visit: Payer: Self-pay

## 2022-10-08 ENCOUNTER — Encounter: Payer: Self-pay | Admitting: Internal Medicine

## 2022-10-08 ENCOUNTER — Ambulatory Visit (INDEPENDENT_AMBULATORY_CARE_PROVIDER_SITE_OTHER): Payer: Medicare Other

## 2022-10-08 ENCOUNTER — Ambulatory Visit (INDEPENDENT_AMBULATORY_CARE_PROVIDER_SITE_OTHER): Payer: Medicare Other | Admitting: Internal Medicine

## 2022-10-08 VITALS — BP 126/68 | HR 103 | Wt 166.0 lb

## 2022-10-08 DIAGNOSIS — J449 Chronic obstructive pulmonary disease, unspecified: Secondary | ICD-10-CM | POA: Diagnosis not present

## 2022-10-08 DIAGNOSIS — J441 Chronic obstructive pulmonary disease with (acute) exacerbation: Secondary | ICD-10-CM

## 2022-10-08 DIAGNOSIS — J9611 Chronic respiratory failure with hypoxia: Secondary | ICD-10-CM

## 2022-10-08 DIAGNOSIS — D721 Eosinophilia, unspecified: Secondary | ICD-10-CM | POA: Diagnosis not present

## 2022-10-08 MED ORDER — DOXYCYCLINE HYCLATE 100 MG PO TABS: 100.0000 mg | ORAL_TABLET | Freq: Two times a day (BID) | ORAL | 0 refills | Status: DC

## 2022-10-08 MED ORDER — PREDNISONE 10 MG PO TABS: ORAL_TABLET | ORAL | 0 refills | Status: AC

## 2022-10-08 NOTE — Progress Notes (Signed)
ROV 12/30/16 -- patient has a history of tobacco use, COPD currently maintained on chronic prednisone, Symbicort, Spiriva. Most recent blurry function testing was March 2011 with an FEV1 of 1.2 L (34% Pred), severe obstruction. He up titrates his prednisone on his own based on symptoms. No abx since last time. He has increased pred temporrily about 4 -5 x since last time. No increases for over a month. He is smoking a pipe, not cigarettes. He is using HCTZ more frequently these days. He is on flonase and singulair. He does note that he has some slow progression of his DOE. He coughs a few times a day, clears clear sputum.   rov 07/17/17 --this is a follow-up visit for patient with active tobacco use, severe COPD and chronic prednisone use.  He often titrates his prednisone on his own depending on how he is feeling. He complains of a new pain in his back below his left shoulder blade, has been present for the last 10 days. sometimes pleuritic, can be stabbing, worse w cough. His pred use: on 10mg  for the last 19 days. No hx VTE. He has LE edema, venous stasis changes, increased since his diuretics decreased.   ROV 09/23/17 --77 year old man with a history of tobacco use (still smokes pipe), severe COPD and severe obstruction on spirometry.  At his last visit he had some pleuritic left back pain and I performed a CT PA that I have reviewed from 07/17/17.  There was no pulmonary embolism but extensive emphysema present, some bilateral apical scar more so on the right, stable.  He uses prednisone 10 mg daily and uptitrate depending on his day-to-day symptoms.  He is otherwise managed on Symbicort and Spiriva.  Desaturated on arrival today after ambulating to the exam room. He reports more dyspnea, has increased pred intermittently.    OV 12/05/2017  Chief Complaint  Patient presents with   Follow-up    Switching from RB to MR.   Pt is on 3L with exertion and 2L at rest. Pt states his breathing had  become worse but since he has been on O2 since 3/21, it has really helped with his breathing.  Pt was in hospital 4/28-5/3. Other than SOB, pt does have c/o cough, rattling in chest. Denies any CP/chest tightness.   History from patient, his wife and review of the old chart  Francisco Coventry IIIs a transfer of care from [name] to myself [name].  His son is my patient and therefore he is done this transfer of care.  According to the patient he is to be seen by [name] for COPD.  Patient is FEV1 34% based on March 2011 PFTs according to chart review.  At baseline he is maintained on Spiriva, Symbicort, Singulair, chronic daily prednisone 10 mg/day for at least 3 years, and 3 times a week of azithromycin for 2 years.  Earlier this year in March 2019 he went on daytime oxygen and following a recent hospitalization April-May 2019.  For syncope that was believed due to nocturnal hypoxemia he was started on night oxygen.  Since starting oxygen his edema has improved and his overall quality of life is improved and he stopped having any orthostasis or presyncopal episodes.  He is grateful for the fact that he is on oxygen.  His current COPD CAT score and severity of symptoms is rated below and is 26.  Show significant amount of symptom burden.  His main goal is to improve  his quality of life.  He had his wife have many questions centering around quality of life, medication therapy.  Of note he has had some thoracic vertebral fractures that are new.  This happened after the fall in late April 2019.  Discovered at admission last month.  He feels is due to prednisone.  He is wondering about portable oxygen.   OV 01/29/2018  Chief Complaint  Patient presents with   Follow-up    Pt states he has been doing okay since last visit. States breathing is about the same, has an occ cough but states the O2 has helped out a lot with breathing and cough. Denies any complaints of CP.   Francisco [name] , 77 y.o., ,  with dob 1946/02/24 and male ,Not Hispanic or Latino from [place] [id number] - presents to lung clinic for advanced COPD follow-up. Since his last visit his symptoms course of improvement. Score is 26 and severe symptoms of document below. He is on Spiriva, Symbicort, continues oxygen, daily prednisone and azithromycin 3 times a week. He has stopped his singulair without any problems. Overall he says he is better. He is willing to give TRELEGY inhaler trial. Last visit to check blood gas and he does not have hypercapnia so he does not qualify for BiPAP        OV 04/03/2018  Subjective:  Patient ID: Francisco [name], male , DOB: 06-11-1946 , age 77 y.o. , MRN: [id number] , ADDRESS: [place] [address]   04/03/2018 -   Chief Complaint  Patient presents with   Acute Visit    Pt's O2 sats have been dropping into the 70s with little exertion.  Pt took O2 off just to shave 9/26 and after he finished shaving put the O2 back on, walked from the bathroom down to the living room on O2 and sats were at 77% on 3L 9/26. Pt also has c/o cough with white to yellow mucus and chest tightness with the SOB.     HPI Francisco [name] 77 y.o. -acute visit for this patient.  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  He is chronically prednisone dependent.  He also is on schedule azithromycin.  He called in yesterday feeling unwell therefore we asked him to come in today.  He tells me that approximately a week ago he started having increased cough and congestion.  Yesterday primary care physician following a routine visit thought he was in COPD exacerbation and started him on doxycycline.  He also personally bumped up his baseline prednisone.  He does me that yesterday while he was shaving on room air he felt more short of breath than usual.  Also when he walked he desaturated more than usual therefore he decided to call in and come in today.   There is no fever or chills or hemoptysis or colored sputum.  No leg edema.  No orthopnea.  He does not feel like he needs to be in the emergency department or get admitted.   OV 05/11/2018  Subjective:  Patient ID: Francisco [name], male , DOB: 1945/12/04 , age 34 y.o. , MRN: [id number] , ADDRESS: [place] [address]   05/11/2018 -   Chief Complaint  Patient presents with   Follow-up    pt states breathing has wrosen since last OV. increased sob, occ chest tightness, prod cough with yellow mucus &  chest congestion. currently taking trelegy samples. recent admission 04/07/18 for PE.     HPI Francisco [name] 77 y.o. -advanced COPD with chronic hypoxemic respiratory failure [not a BiPAP candidate because of lack of hypercapnia] presents with his wife for follow-up.  After the last visit he continued to have symptoms so on April 07, 2018 he got an outpatient CT scan and he had pulmonary embolism.  He got admitted to the hospital and then discharged and since then has been on Xarelto.  He continues his baseline 2-3 L of nasal cannula oxygen but he says since then he is more symptomatic.  Is also corresponds with him starting Trelegy inhaler and also for the last few to several weeks is having increased cough and congestion and yellow phlegm.  His COPD CAT score is deteriorated to 35 and above.  He is very frustrated by his symptoms.  Review of his medication shows that he takes azithromycin and chronic prednisone and Trelegy and oxygen for his COPD.  He is on Xarelto for his blood clots.  He is also on Fosamax for prednisone dependent osteoporosis management.  His smoking is in remission.  Wife says he is always sedentary.  Apparently they tried to do some physical therapy a month ago but he desaturated.  He does not seem to keen on physical therapy right now     OV 06/10/2018  Subjective:  Patient ID: Francisco [name], male , DOB: Oct 18, 1945 , age 20 y.o. , MRN: [id number] ,  ADDRESS: [place] [address]   06/10/2018 -   Chief Complaint  Patient presents with   Follow-up    follows for COPD. patient has increased SOB with exertion     HPI Francisco [name] 77 y.o. -returns for gold stage IV COPD follow-up with chronic hypoxemic respiratory failure.  I saw him approximately a month ago at which time recommended Trelegy.  However the Trelegy is not working well for him.  He feels Spiriva and Symbicort is of benefit for him.  Also recommended he start himself on Daliresp.  Given the GI side effect profile I communicated with his primary care physician about stopping Fosamax.  His primary care physician has advised Reclast infusion for him and is considering this but has not yet switched over.  This because the infusions will have to happen at [facility] long.  However he never started his Daliresp because he read the side effect profile and was worried about back pain.  He wanted me to go over the side effect profile of Daliresp all over again.  I printed up-to-date and went over all the side effects.  I did this with him and his wife.  There is a 3% incidence of backache.  The dominant feature is GI issues of weight loss and diarrhea and nausea and abdominal pain.  The second dominant feature is some anxiety issues.  After reading all this he has decided to start [place].  He is aware of the limitations as well.  He is aware that this is purely preventive in reducing COPD exacerbation frequency.  He understands that the burden on his health from frequent COPD exacerbations is high.  Currently COPD CAT score is back at baseline        OV 07/22/2018  Subjective:  Patient ID: Francisco [name], male , DOB: 10-26-1945 , age 35 y.o. , MRN: [id number] , ADDRESS: [place] [address]   07/22/2018 -  Chief Complaint  Patient presents with   Follow-up    Pt states it has been rough since last visit. States he has been coughing with  white to yellow phlegm, wheezing, increased SOB which has been going on x2-3 weeks now. Pt denies any real complaints of chest tightness/chest pain.     HPI Francisco [name] 77 y.o. -presents for follow-up of his advanced COPD with chronic hypoxemic respiratory failure.  At last visit we introduce Daliresp as is an effort to prevent COPD flareups.  After some trepidation he started taking it.  He is currently on full dose 5 mcg daily and is tolerating it well.  However he does not think it is reduced the frequency of flareups but again it is too soon to tell.  Approximately over a week ago after some sick contact exposure he had symptoms and signs of COPD exacerbation.  He was given I suspect Levaquin but this caused some confusion.  After that he was changed to doxycycline.  Today's his last of 7 days with doxycycline he is better.  COPD CAT score is improved to 26 but he still is coughing quite a bit and has mucus and feels that he is still not fully back to baseline.  He had a chest x-ray July 13, 2018 that reports this is clear.  He continues on oxygen Spiriva and Symbicort.  There are no other new issues.  No chest pain edema hemoptysis or weight loss.    OV 03/05/2019  Subjective:  Patient ID: Francisco [name], male , DOB: 1946/02/08 , age 66 y.o. , MRN: [id number] , ADDRESS: [place] [address]   03/05/2019 -   Chief Complaint  Patient presents with   COPD with acute exacerbation    Feels it is a little worse since June. Has cough with congestion, was white in color. But this morning it has a yellow and green color to it.     HPI Francisco [name] 77 y.o. -returns for his advanced COPD follow-up.  He says that since his last visit he has been doing overall fair and stable.  Uses 3 L of oxygen, Daliresp, Spiriva, Symbicort.  He has been avoiding risk activities for COVID-19.  He says in the last few weeks he has had increased congestion he does not think this is  COVID-19.  Today he has green-yellow sputum.  He is asking about taking a flu shot.    OV 05/07/2019  Subjective:  Patient ID: Francisco [name], male , DOB: 1946/02/28 , age 81 y.o. , MRN: [id number] , ADDRESS: [place] [address]   05/07/2019 -   Chief Complaint  Patient presents with   Follow-up    Pt states his breathing is at his baseline. Pt c/o prod cough with clear mucus - baseline for pt. Pt denies CP/tightness and f/c/s.    Advance COPD with chronic hypoxemic respiratory failure.  Baseline functional status ECOG 3-4  HPI Francisco [name] 77 y.o. -presents for COPD follow-up.  He continues to be stable using oxygen.  His current COPD CAT score is 30 which is his baseline.  He recently had a vascular surgery.  He is on oxygen, prednisone, Daliresp, Spiriva and Symbicort.  He is gone back to taking azithromycin for prophylaxis m again COPD exacerbation.        OV 11/11/2019  Subjective:  Patient ID: Francisco [name], male , DOB: 1945-10-13 ,  age 70 y.o. , MRN: [id number] , ADDRESS: [place] [address]   11/11/2019 -   Chief Complaint  Patient presents with   Follow-up    Pt states his breathing is progressively becoming worse and states he feels like he may be in a flare with the COPD. Pt states he is coughing up yellow-green phlegm and also has complaints of wheezing.   Advance COPD patient with recurrent COPD exacerbations.  HPI Francisco [name] 77 y.o. -presents for follow-up.  He says last month he saw [name] nurse practitioner.  He says he was given a different antibiotic that really helped.  Review of the records indicate one time it was Augmentin another time it was [place].  He believes the second 1 really helped.  He feels in another exacerbation.  He is having green sputum.  He says with the flutter valve and exacerbation a lot of sputum comes out but between exacerbations is very minimal.  He is frustrated by his  recurrent exacerbations.  He did not tolerate Trelegy in the past.  He is on Spiriva, Symbicort, oxygen 3 L, flutter valve, Flonase, Roflumilast and 3 times a week of azithromycin.  He is looking to see if he can improve his quality of life.  His COPD CAT score is 34.  His last CT scan of the chest was in 2019.     CAT Score 11/11/2019 03/05/2019 04/03/2018  Total CAT Score 32 30 30     OV 11/26/2019 - telephone visit. 2PHI identified. Risks, benefit, limitation explained  Subjective:  Patient ID: Francisco [name], male , DOB: 08/17/1945 , age 41 y.o. , MRN: [id number] , ADDRESS: [place] [address] Advanced COPD with recurrent exacerbation now colonized by pseudomonas  11/26/2019 -    HPI Francisco [name] 77 y.o. -last seen by myself Nov 11, 2019.  At that time we decided to do a work-up for recurrent exacerbations.  His immunoglobulin levels are normal.  His sputum grew Pseudomonas that is pansensitive.  He CT scan of the chest shows right upper lobe worsening fibrotic pattern.  There is no mass per se.  I personally visualized the CT chest.  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  Today in the visit he tells me after 3 days he had nausea and then he had joint pain on the third day and quit taking it.  He still continues to be symptomatic.     ROS - per HPI  CT Chest Wo Contrast  Result Date: 11/25/2019 CLINICAL DATA:  COPD exacerbation. Severe shortness of breath. EXAM: CT CHEST WITHOUT CONTRAST TECHNIQUE: Multidetector CT imaging of the chest was performed following the standard protocol without IV contrast. COMPARISON:  04/07/2018. FINDINGS: Cardiovascular: The heart size is normal. There is no pericardial effusion. Aortic atherosclerosis. Three vessel coronary artery atherosclerotic calcifications identified Mediastinum/Nodes: No enlarged mediastinal or axillary  lymph nodes. Thyroid gland, trachea, and esophagus demonstrate no significant findings. Lungs/Pleura: No pleural effusion identified. Advanced changes of centrilobular and paraseptal emphysema with mild bullous changes. Progressive confluent fibrotic with extensive architectural distortion and volume loss no acute airspace consolidation, atelectasis or pneumothorax. Scattered, bilateral areas of pleuroparenchymal scarring identified changes within the apical portions of the right upper lobe are again noted. Upper Abdomen: Bilateral low-attenuation adrenal nodules appears similar to previous exam and are compatible with benign  adenomas. No acute abnormality identified within the imaged portions of the upper abdomen. Musculoskeletal: Mild scoliosis and degenerative disc disease noted within the thoracic spine. Stable chronic compression deformities at T6, T7 and T8. No new compression fractures. IMPRESSION: 1. No acute cardiopulmonary abnormalities. 2. Progressive confluent fibrotic changes within the right upper lobe with extensive architectural distortion and volume loss. Likely the sequelae of prior inflammation/infection. 3. Aortic atherosclerosis, in addition to 3 vessel coronary artery disease. Please note that although the presence of coronary artery calcium documents the presence of coronary artery disease, the severity of this disease and any potential stenosis cannot be assessed on this non-gated CT examination. Assessment for potential risk factor modification, dietary therapy or pharmacologic therapy may be warranted, if clinically indicated. 4. Bilateral adrenal adenomas. 5. Stable chronic compression deformities at T6, T7 and T8. 6. Diffuse bronchial wall thickening with emphysema, as above; imaging findings suggestive of underlying COPD. Aortic Atherosclerosis (ICD10-I70.0) and Emphysema (ICD10-J43.9). Electronically Signed   By: Kerby Moors M.D.   On: 11/25/2019 12:21     OV  05/03/2020  Subjective:  Patient ID: Francisco [name], male , DOB: 1945/12/01 , age 35 y.o. , MRN: [id number] , ADDRESS: [place] [address] PCP [name], MD Patient Care Team: [name], MD as PCP - General (Family Medicine) Brand Males, MD as Consulting Physician (Pulmonary Disease) Danella Sensing, MD as Consulting Physician (Dermatology) Dr. Henreitta Leber, DDS (Dentistry)  This Provider for this visit: Treatment Team:  Attending Provider: Brand Males, MD    05/03/2020 -   Chief Complaint  Patient presents with   Follow-up    doing best he has in a year and a half.     ICD-10-CM   1. Stage 4 very severe COPD by GOLD classification ([place])  J44.9   2. Chronic respiratory failure with hypoxia (HCC)  J96.11   3. COPD, frequent exacerbations ([place])  J44.1   4. Abnormal findings on diagnostic imaging of lung  R91.8   5. Pulmonary embolism, unspecified chronicity, unspecified pulmonary embolism type, unspecified whether acute cor pulmonale present (HCC)  I26.99   6. Vaccine counseling  Z71.85      HPI Francisco [name] 77 y.o. -presents with his wife for the above issues.  He said his Covid booster has had his flu shot.  He has follow-up coming end of November with primary care physician.  He wants me to send a note to the primary care physician.  He is without any exacerbations.  He continues prednisone, azithromycin, Roflumilast, Spiriva and Symbicort.  He does not want to do triple inhaler therapies.  He is on anticoagulation.  He says after the GI bleeding issues resolved he is better.  He had a CT scan of the chest that is documented below.  His infiltrates are improving but there is mucus/debris in the main airway he is denying any aspiration.  He does have hiatal hernia.  Apparently this was discovered during endoscopy.  CAT Score 05/03/2020 11/11/2019 03/05/2019 04/03/2018  Total CAT Score 16 32 30 30     IMPRESSION: 1. Trace right  pleural effusion and associated atelectasis or consolidation, both of which are improved compared to prior examination. Findings are generally consistent with resolving sequelae of prior infection or aspiration. 2. Diffuse bilateral bronchial wall thickening consistent with nonspecific infectious or inflammatory bronchitis. 3. Frothy debris in the bilateral mainstem bronchi, concerning for aspiration. 4. Severe centrilobular emphysema. Emphysema (ICD10-J43.9). 5. Nonspecific fibrotic  scarring of the right greater than left lung apices, likely sequelae of prior infection or inflammation. 6. Coronary artery disease.  Aortic Atherosclerosis (ICD10-I70.0).     Electronically Signed   By: Eddie Candle M.D.   On: 04/26/2020 13:42   ROS - per HPI  OV 08/31/2020  Subjective:  Patient ID: Francisco [name], male , DOB: 11/11/1945 , age 45 y.o. , MRN: [id number] , ADDRESS: [place] [address] PCP [name], MD Patient Care Team: [name], MD as PCP - General (Family Medicine) Brand Males, MD as Consulting Physician (Pulmonary Disease) Danella Sensing, MD as Consulting Physician (Dermatology) Dr. Henreitta Leber, DDS (Dentistry)  This Provider for this visit: Treatment Team:  Attending Provider: Brand Males, MD    08/31/2020 -   Chief Complaint  Patient presents with   Follow-up    More congestion at the moment, more SOB   Advanced end-stage COPD on oxygen and ECOG 3-4 History of pulmonary embolism on anticoagulation  HPI Francisco [name] 77 y.o. -presents for follow-up.  Presents with his wife he sitting on wheelchair.  He says for the last few weeks he is having a flareup with increased urine production that is green.  He wants antibiotics.  He says in December 2021 I switched his Omnicef to ciprofloxacin based on Pseudomonas and sensitivity but he actually felt the [place] worked better.  However he said that the duration again was too  short and he actually wants Omnicef again for 14 days at this time.  He also thinks a prednisone taper/burst would help him.  Otherwise he continues on Spiriva Symbicort Daliresp 3 times weekly azithromycin and daily prednisone 10 mg.  Is also on oxygen.  He is on anticoagulation for his pulmonary embolism.  He fell down and broke his back but did not have any bleeding.  He is going to have an MRI.  Noted that he is not on any Mucinex and is going to start this after my advice.  We discussed about switching his Breo and Symbicort to a triple inhaler combination either Trelegy or BREZTRI but he says both of not working he does not want to do it.   OV 04/26/2021  Subjective:  Patient ID: Francisco [name], male , DOB: 09/17/1945 , age 24 y.o. , MRN: [id number] , ADDRESS: [address] [place] [address] PCP [name], MD Patient Care Team: [name], MD as PCP - General (Family Medicine) Brand Males, MD as Consulting Physician (Pulmonary Disease) Danella Sensing, MD as Consulting Physician (Dermatology) Dr. Henreitta Leber, DDS (Dentistry)  This Provider for this visit: Treatment Team:  Attending Provider: Brand Males, MD    04/26/2021 -   Chief Complaint  Patient presents with   Follow-up    Pt states he is about the same since last visit. Has problems with coughing and will get up occ white to yellow phlegm and also has had some wheezing.    Advanced end-stage COPD on oxygen and ECOG 3-4 - last CT Oct 2021 History of pulmonary embolism on anticoagulation - June 2021 Iron def anemia - follows Dr Julien Nordmann  HPI Francisco [name] 77 y.o. -returns for follow-up with his wife.  His ECOG continues to be 3-4.  He is wheelchair and minimal activities.  He is on oxygen, Spiriva, Symbicort, azithromycin for preventing COPD exacerbations, Daliresp, daily prednisone.  Is not using 3% saline nebulizer.  Instead he says flutter valve  works well for him.  Last seen in  February 2022 and since then no exacerbations.  The longest time he has had without exacerbation he is happy about it.  He has some waxing and waning sputum production.  Few days ago it was yellow but now clear.  He is going to monitor this.  He is up-to-date with his COVID vaccines.  He is no longer following with Dr. Julien Nordmann for his iron deficiency anemia.  He is on anticoagulation for his PE in June 2021.    CT Chest data  No results found.    PFT  No flowsheet data found.  OV 07/26/2021  Subjective:  Patient ID: Francisco [name], male , DOB: May 28, 1946 , age 88 y.o. , MRN: [id number] , ADDRESS: [address] [place] [address] PCP [name], MD Patient Care Team: [name], MD as PCP - General (Family Medicine) Brand Males, MD as Consulting Physician (Pulmonary Disease) Danella Sensing, MD as Consulting Physician (Dermatology) Dr. Henreitta Leber, DDS (Dentistry)  This Provider for this visit: Treatment Team:  Attending Provider: Brand Males, MD    07/26/2021 -   Chief Complaint  Patient presents with   Follow-up    Pt is here to discuss results of recent CT.    Advanced end-stage COPD on oxygen and ECOG 3-4 - last CT Oct 2021 History of pulmonary embolism on anticoagulation - June 2021 Iron def anemia - follows Dr Julien Nordmann Right lower lobe superior segment nodule - 9 m -January 2023 new onset  HPI Francisco [name] 77 y.o. -follows for his COPD.  Presents with his wife.  He continues on his oxygen at 4 L.  He feels he is beginning to have a COPD exacerbation.  He did have a CT scan of the chest that was routine.  It shows a 9 mm right lower lobe superior segment nodule.  I looked at it it looks solid and lobulated.  I think there is a fair probability greater than 65% this is malignancy.  He is asymptomatic from it.  He just wants preemptive antibiotics and prednisone burst for his incipient COPD exacerbation.  In terms of his pulmonary  embolism from 2 and half years ago he continues his Eliquis.  The Eliquis at low-dose for prophylaxis.  His D-dimer in the past has been normal.  But we decided to continue the low-dose Eliquis.  We will check his D-dimer again.    CAT Score 04/26/2021 08/31/2020 05/03/2020 11/11/2019  Total CAT Score _0 32        CT chest 07/10/21  Narrative & Impression  CLINICAL DATA:  COPD exacerbation. Respiratory failure with hypoxia.   EXAM: CT CHEST WITHOUT CONTRAST   TECHNIQUE: Multidetector CT imaging of the chest was performed following the standard protocol without IV contrast.   COMPARISON:  04/25/2020   FINDINGS: Cardiovascular: The heart size is within normal limits. No pericardial effusion. Aortic atherosclerosis and coronary artery calcifications.   Mediastinum/Nodes: No enlarged mediastinal or axillary lymph nodes. Thyroid gland, trachea, and esophagus demonstrate no significant findings.   Lungs/Pleura: Severe changes of centrilobular and paraseptal emphysema. Diffuse bronchial wall thickening. No pleural effusion or acute airspace consolidation. No interstitial edema. Scarring and masslike architectural distortion within the right apex appears unchanged from the previous exam. Within the superior segment of right lower lobe there is a lung nodule measuring 1.1 x 0.6 cm (mean diameter 9 mm), image 82/3. New compared with the previous exam.  Upper Abdomen: No acute findings within the imaged portions of the upper abdomen. Aortic atherosclerosis.   Musculoskeletal: Curvature of the thoracic spine is convex towards the right. Compression fractures are again noted involving T6, T7 and T8. Unchanged from previous exam.   IMPRESSION: 1. No acute cardiopulmonary abnormalities. 2. There is a new pulmonary nodule within the superior segment of right lower lobe with a mean diameter of 9 mm. Consider a non-contrast Chest CT at 3 months, a PET/CT, or tissue  sampling. These guidelines do not apply to immunocompromised patients and patients with cancer. Follow up in patients with significant comorbidities as clinically warranted. For lung cancer screening, adhere to Lung-RADS guidelines. Reference: Radiology. 2017; 284(1):228-43. 3. Stable scarring and masslike architectural distortion within the right apex. 4. Diffuse bronchial wall thickening with emphysema, as above; imaging findings suggestive of underlying COPD. 5. Stable thoracic compression fractures. 6. Aortic Atherosclerosis (ICD10-I70.0) and Emphysema (ICD10-J43.9).     Electronically Signed   By: Kerby Moors M.D.   On: 07/10/2021 13:33       09/11/2021 Pt. Presents for follow up. He had Flexible video fiberoptic bronchoscopy with robotic assistance and biopsies on 09/04/2021. He states he has done well after his biopsy. No significant bleeding. Just a scan amount the day after. He did have a lot of rib pain , that has subsequently improved. I gave the patient the biopsy results. We discussed this in full to include treatment options and all questions were asked and answered. [name] Kitchen   He says he is currently in the middle of a COPD flare. He has thick yellow green secretions .He also endorses a bad cough.  He does have a flutter valve. He does endorse some wheezing.He resumed his Eliquis on 09/06/2021.We will treat him for his COPD flare today.He is compliant with his Daliresp, Spiriva and Symbicort.    I have referred to radiation oncology, as he is oxygen dependent and in a wheelchair, SBRT is best option for this single nodule. . Both he and his wife had  questions that were answered. I have asked them to call the office if they need anything at all.   Test Results: Cytology 09/04/2021 MICROSCOPIC DIAGNOSIS:  A. 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  Immunohistochemistry shows  patchy positivity with cytokeratin 5/6 and weak positivity with CD56.  The malignant cells are negative with TTF-1, chromogranin,  synaptophysin, p40 and p63.  Ki-67 shows a high proliferation rate.   PET scan 08/08/2021 Solitary hypermetabolic pulmonary nodule in the RIGHT lower lobe with intense metabolic activity is most consistent PRIMARY BRONCHOGENIC CARCINOMA. 2. No mediastinal adenopathy, supraclavicular adenopathy or distant metastatic disease. No skeletal metastasis.     OV 12/05/2021  Subjective:  Patient ID: Francisco [name], male , DOB: [birth year] , age 82 y.o. , MRN: [id number] , ADDRESS: [address] [place] [id number] PCP [name], MD Patient Care Team: [name], MD as PCP - General (Family Medicine) Brand Males, MD as Consulting Physician (Pulmonary Disease) Danella Sensing, MD as Consulting Physician (Dermatology) Dr. Henreitta Leber, DDS (Dentistry)  This Provider for this visit: Treatment Team:  Attending Provider: Brand Males, MD    12/05/2021 -   Chief Complaint  Patient presents  with   Follow-up    Pt states since his last radiation treatment, his breathing has become a little worse. States his cough is about the same.    HPI Francisco [name] 77 y.o. -return to see me.  I presents with his wife.  I am seeing him for the first time after the diagnose of cancer.  He said radiation 3 times.  He says after the radiation he is a little bit more short of breath.  He is now needing 4 L nasal cannula.  When he exerts he can desaturate into the 80s and sometimes into the 70s.  He is able to recover with resting.  His amount of cough and sputum is around the same.  It is between clear and green and yellow.  He is not in a COPD exacerbation currently but according to his wife it appears that he might get  one by next week.  She says whenever the antibiotic cephalosporin is on and prednisone he is doing great but otherwise he will have a flareup.  He is already on azithromycin 3 times a week and also Roflumilast for prevention of COPD flareup.  Despite this he gets recurrent COPD flareup.  Reviewed his eosinophilic profile.  Prior to prednisone some of the eosinophils were as high as 300 cells per cubic millimeter.  We did discuss about the fact that [place] might be effective but is eosinophilic count can be obliterated by the prednisone.  He is willing to try this if he has eosinophilia or if you are able to get insurance justification for COPD/asthma overlap.  He does behave like COPD and asthma overlap.  Currently his medications include Symbicort, Spiriva, Daliresp, azithromycin, Flonase and oxygen  He and his wife are requesting a prescription for COPD exacerbation be sent   He has upcoming appointment Dr. Julien Nordmann.  He does not think he will be a good candidate for chemotherapy CT Chest data  No results found.      OV 03/07/2022  Subjective:  Patient ID: Francisco [name], male , DOB: 05-09-1946 , age 64 y.o. , MRN: [id number] , ADDRESS: [address] [place] [facility] [address] PCP [name], MD Patient Care Team: [name], MD as PCP - General (Family Medicine) Brand Males, MD as Consulting Physician (Pulmonary Disease) Danella Sensing, MD as Consulting Physician (Dermatology) Dr. Henreitta Leber, DDS (Dentistry)  This Provider for this visit: Treatment Team:  Attending Provider: Brand Males, MD  03/07/2022 -   Chief Complaint  Patient presents with   Follow-up    Pt states he has been doing about the same since last visit.     HPI JEZIAH LANDGREN [name] 77 y.o. -returns for follow-up of his COPD.  Since last visit no flareups.  He is doing stable.  Overall the same.  Continues all his medications.  Ordered blood gas last time but for some reason it was not  done.  We did blood eosinophils and it was 200 cells per cubic millimeter suppressed by prednisone.  He did not qualify fo dupixwnr.  I recommended we do this again and he is willing.  He has upcoming labs with Dr. Garret Reddish March 14, 2022.  I have sent a secure chat to Dr. Yong Channel to get this done.  We talked about getting the full vaccines of RSV, flu and COVID.  He plans to do that.  No new issues.  His wife is here with him.   OV 10/08/2022  Subjective:  Patient ID: Francisco [name], male , DOB: 1946/03/05 , age 32 y.o. , MRN: [id number] , ADDRESS: [address] [place] [address] PCP [name], MD Patient Care Team: [name], MD as PCP - General (Family Medicine) Brand Males, MD as Consulting Physician (Pulmonary Disease) Danella Sensing, MD as Consulting Physician (Dermatology) Dr. Henreitta Leber, DDS (Dentistry)  This Provider for this visit: Treatment Team:  Attending Provider: Brand Males, MD    10/08/2022 -   Chief Complaint  Patient presents with   Follow-up    Pt is here for follow up for copd. Pt is on Symbicort and Sprivia daily and Albuterol as needed. Pt states that these are working well for him with his copd. Pt is on 4L of oxygen pulsed and is workinf well.      HPI Francisco [name] 77 y.o. -returns for follow-up.  Since his last visit he has had admission to the hospital for intestinal issues and bleeding.  During this time this was in February 2024 his blood eosinophils did go up and he meets criteria for Dupixent.  He then went to a nursing home and apparently had pneumonia.  I do not have those films with me..  In the interim he is also had CT scan of the chest in December 2023 and it shows no evidence of tumor recurrence.  I personally visualized the films.  He feels he is got a COPD exacerbation coming along right now for the last few days with brown sputum he wants another course of antibiotics and prednisone.  We did discuss  Dupixent and he wants to do it.  His wife is an independent historian today and she affirms the same.  Last visit I did recommend Hill ROM event 2000 system but he does not want to do it.  This because it is very inconvenient for him.  Otherwise he continues his regular medications including oxygen prednisone 10 mg azithromycin and low-dose Eliquis.       Latest Reference Range & Units 10/31/08 13:57 02/12/10 08:32 01/31/11 08:26 10/23/11 09:34 01/15/12 08:08 02/09/13 09:14 02/25/14 08:55 06/05/16 08:23 12/06/16 07:54 06/25/17 08:19 12/25/17 08:50 04/03/18 11:48 04/07/18 11:46 06/02/19 11:05 11/29/19 09:26 02/18/20 16:45 03/10/20 11:07 07/06/20 09:41 08/23/20 12:57 10/04/20 14:53 12/07/20 08:48 12/10/21 08:49 03/14/22 09:59 06/06/22 10:06 06/21/22 14:32 08/09/22 13:30 08/11/22 05:31 08/12/22 02:26 08/13/22 05:22 08/14/22 06:17 08/15/22 02:37  Eosinophils Absolute 0.0 - 0.5 K/uL 0.1 0.2 0.2 0.0 0.2 0.1 0.3 0.1 0.2 0.2 0.0 0.0 0.0 0.0 0.2 0.0 53 157 0.0 0.0 0.2 0.2 0.1 0.1 0.1 0.0 0.3 0.2 0.4 0.4 0.0        has a past medical history of Chronic back pain, Chronic rhinitis, Compressed spine fracture, COPD (chronic obstructive pulmonary disease), Dyspnea, Emphysema lung, On home oxygen therapy, Onychomycosis, Orthostatic hypotension, PAD (peripheral artery disease), Pneumonia, Pulmonary embolism (04/07/2018), Skin cancer, Small cell lung cancer, right (2023), and Vertigo.   reports that he quit smoking about 5 years ago. His smoking use included pipe and cigarettes. He started smoking about 57 years ago. He has a 104.00 pack-year smoking history. He has never used smokeless tobacco.  Past Surgical History:  Procedure Laterality Date   ABDOMINAL AORTOGRAM W/LOWER EXTREMITY N/A 01/21/2020   Procedure: ABDOMINAL AORTOGRAM W/LOWER EXTREMITY;  Surgeon: Elam Dutch, MD;  Location: [place] [facility];  Service: Cardiovascular;  Laterality: N/A;   APPENDECTOMY     BIOPSY  02/20/2020   Procedure:  BIOPSY;  Surgeon: Irving Copas., MD;  Location: [place];  Service: Gastroenterology;;   BIOPSY  08/12/2022   Procedure: BIOPSY;  Surgeon: Lavena Bullion, DO;  Location: [place] [facility];  Service: Gastroenterology;;   BRONCHIAL BIOPSY  09/04/2021   Procedure: BRONCHIAL BIOPSIES;  Surgeon: Garner Nash, DO;  Location: [place] [facility];  Service: Pulmonary;;   BRONCHIAL BRUSHINGS  09/04/2021   Procedure: BRONCHIAL BRUSHINGS;  Surgeon: Garner Nash, DO;  Location: [place];  Service: Pulmonary;;   BRONCHIAL NEEDLE ASPIRATION BIOPSY  09/04/2021   Procedure: BRONCHIAL NEEDLE ASPIRATION BIOPSIES;  Surgeon: Garner Nash, DO;  Location: [place];  Service: Pulmonary;;   CATARACT EXTRACTION, BILATERAL     ENDARTERECTOMY FEMORAL Right 04/19/2019   Procedure: ENDARTERECTOMY RIGHT COMMON FEMORAL;  Surgeon: Elam Dutch, MD;  Location[facility];  Service: Vascular;  Laterality: Right;   ENDARTERECTOMY FEMORAL Left 01/24/2020   Procedure: LEFT FEMORAL ENDARTERECTOMY WITH DACRON PATCH ANGIOPLASTY;  Surgeon: Elam Dutch, MD;  [facility];  Service: Vascular;  Laterality: Left;   ESOPHAGOGASTRODUODENOSCOPY (EGD) WITH PROPOFOL N/A 02/20/2020   Procedure: ESOPHAGOGASTRODUODENOSCOPY (EGD) WITH PROPOFOL;  Surgeon: Irving Copas., MD;  Location: [place];  Service: Gastroenterology;  Laterality: N/A;   ESOPHAGOGASTRODUODENOSCOPY (EGD) WITH PROPOFOL N/A 08/12/2022   Procedure: ESOPHAGOGASTRODUODENOSCOPY (EGD) WITH PROPOFOL;  Surgeon: Lavena Bullion, DO;  Location: [place];  Service: Gastroenterology;  Laterality: N/A;   FEMORAL ENDARTERECTOMY Left 01/24/2020   FIDUCIAL MARKER PLACEMENT  09/04/2021   Procedure: FIDUCIAL MARKER PLACEMENT;  Surgeon: Garner Nash, DO;  Location: [place] [facility];  Service: Pulmonary;;   HEMOSTASIS CLIP PLACEMENT  02/20/2020   Procedure: HEMOSTASIS CLIP PLACEMENT;  Surgeon: Irving Copas., MD;  Location: [place];   Service: Gastroenterology;;   HEMOSTASIS CLIP PLACEMENT  08/12/2022   Procedure: HEMOSTASIS CLIP PLACEMENT;  Surgeon: Lavena Bullion, DO;  Location: [place];  Service: Gastroenterology;;   HOT HEMOSTASIS N/A 02/20/2020   Procedure: HOT HEMOSTASIS (ARGON PLASMA COAGULATION/BICAP);  Surgeon: Irving Copas., MD;  Location: [place];  Service: Gastroenterology;  Laterality: N/A;   HOT HEMOSTASIS N/A 08/12/2022   Procedure: HOT HEMOSTASIS (ARGON PLASMA COAGULATION/BICAP);  Surgeon: Lavena Bullion, DO;  Location: [facility];  Service: Gastroenterology;  Laterality: N/A;   INSERTION OF ILIAC STENT Left 01/24/2020   Procedure: INSERTION OF VBX STENT 8X59 AND 8X39 LEFT COMMON ILIAC ARTERY. INSERTION OF INNOVA 7 X 60 INNOVA STENT LEFT EXTERNAL ILIAC ARTERY. ;  Surgeon: Elam Dutch, MD;  Location: [place];  Service: Vascular;  Laterality: Left;   LOWER EXTREMITY ANGIOGRAPHY  12/11/2018   LOWER EXTREMITY ANGIOGRAPHY N/A 12/11/2018   Procedure: LOWER EXTREMITY ANGIOGRAPHY;  Surgeon: Elam Dutch, MD;  Location: [place] [facility];  Service: Cardiovascular;  Laterality: N/A;   PATCH ANGIOPLASTY Right 04/19/2019   Procedure: Patch Angioplasty;  Surgeon: Elam Dutch, MD;  Location: [facility];  Service: Vascular;  Laterality: Right;   PERIPHERAL VASCULAR INTERVENTION Right 12/11/2018   Procedure: PERIPHERAL VASCULAR INTERVENTION;  Surgeon: Elam Dutch, MD;  Location: [place] [facility];  Service: Cardiovascular;  Laterality: Right;  Common Iliac    SKIN CANCER EXCISION     "lips, face, ears, arms" (04/07/2018)   SUBMUCOSAL INJECTION  08/12/2022   Procedure: SUBMUCOSAL INJECTION;  Surgeon: Lavena Bullion, DO;  Location: [place];  Service: Gastroenterology;;   TONSILLECTOMY     ULTRASOUND GUIDANCE FOR VASCULAR ACCESS Right 01/24/2020   Procedure: ULTRASOUND GUIDANCE FOR VASCULAR ACCESS;  Surgeon:  Elam Dutch, MD;  [facility];  Service: Vascular;  Laterality:  Right;   VIDEO BRONCHOSCOPY WITH RADIAL ENDOBRONCHIAL ULTRASOUND  09/04/2021   Procedure: VIDEO BRONCHOSCOPY WITH RADIAL ENDOBRONCHIAL ULTRASOUND;  Surgeon: Garner Nash, DO;  Location: [place] [facility];  Service: Pulmonary;;    Allergies  Allergen Reactions   Tape Other (See Comments)    Skin tears easily.   Augmentin [Amoxicillin-Pot Clavulanate] Other (See Comments)    Thrush Sore throat Hoarse voice   Cipro [Ciprofloxacin Hcl] Nausea And Vomiting    Weakness Fatigue    Gadavist [Gadobutrol] Other (See Comments)    Unknown reaction   Levaquin [Levofloxacin] Other (See Comments)    Hallucinations    Lipitor [Atorvastatin] Rash    Immunization History  Administered Date(s) Administered   Fluad Quad(high Dose 65+) 03/16/2019, 03/10/2020   Influenza Split 04/24/2012   Influenza Whole 05/08/2009, 04/08/2011   Influenza, High Dose Seasonal PF 05/01/2017, 03/31/2018, 04/17/2021, 04/03/2022   Influenza,inj,Quad PF,6+ Mos 04/27/2013, 04/15/2014, 04/24/2015   Influenza,inj,quad, With Preservative 05/01/2018   Influenza-Unspecified 05/13/2016   PFIZER Comirnaty(Gray Top)Covid-19 Tri-Sucrose Vaccine 11/24/2020, 04/03/2022   PFIZER(Purple Top)SARS-COV-2 Vaccination 07/30/2019, 08/20/2019, 04/07/2020   Pfizer Covid-19 Vaccine Bivalent Booster 4yrs & up 03/16/2021   Pneumococcal Conjugate-13 02/25/2014   Pneumococcal Polysaccharide-23 08/15/2011, 12/19/2016   Pneumococcal-Unspecified 03/08/2017   Respiratory Syncytial Virus Vaccine,Recomb Aduvanted(Arexvy) 06/06/2022   Td 02/21/2010   Tdap 11/05/2017   Zoster, Live 01/29/2012    Family History  Problem Relation Age of Onset   Heart disease Mother        CABG in her 60s, nonsmoker   Cancer Mother    Stroke Father    Heart disease Father        Died of MI at age 34, smoker   Hepatitis Sister    Coronary artery disease Other        male 1st degree relative <60   Colon cancer Neg Hx    Pancreatic cancer Neg Hx    Esophageal  cancer Neg Hx    Inflammatory bowel disease Neg Hx    Liver disease Neg Hx    Rectal cancer Neg Hx    Stomach cancer Neg Hx      Current Outpatient Medications:    acetaminophen (TYLENOL) 500 MG tablet, Take 500 mg by mouth every 4 (four) hours as needed for moderate pain, fever, headache or mild pain., Disp: , Rfl:    albuterol (PROVENTIL) (2.5 MG/3ML) 0.083% nebulizer solution, Take 3 mLs (2.5 mg total) by nebulization every 6 (six) hours as needed for wheezing or shortness of breath., Disp: 75 mL, Rfl: 12   Calcium Carbonate-Vitamin D (CALCIUM 600+D PO), Take 2 tablets by mouth daily., Disp: , Rfl:    docusate sodium (COLACE) 100 MG capsule, Take 2 capsules (200 mg total) by mouth 2 (two) times daily., Disp: 10 capsule, Rfl: 0   ELIQUIS 2.5 MG TABS tablet, TAKE 1 TABLET BY MOUTH TWICE  DAILY (Patient taking differently: Take 2.5 mg by mouth 2 (two) times daily.), Disp: 180 tablet, Rfl: 3   Ferrous Sulfate (IRON) 325 (65 Fe) MG TABS, Take 325 mg by mouth every other day., Disp: , Rfl:    fluticasone (FLONASE) 50 MCG/ACT nasal spray, USE 2 SPRAYS IN BOTH  NOSTRILS DAILY (Patient taking differently: Place 2 sprays into both nostrils daily.), Disp: 48 g, Rfl: 3   folic acid (FOLVITE) 1 MG tablet, Take 1 tablet (1 mg total) by mouth daily., Disp: , Rfl:  furosemide (LASIX) 20 MG tablet, Take 1 tablet (20 mg total) by mouth daily as needed for fluid or edema., Disp: , Rfl:    Multiple Vitamin (MULTIVITAMIN WITH MINERALS) TABS tablet, Take 1 tablet by mouth daily., Disp: , Rfl:    mupirocin cream (BACTROBAN) 2 %, Apply 1 Application topically 2 (two) times daily., Disp: 30 g, Rfl: 0   oxyCODONE (OXY IR/ROXICODONE) 5 MG immediate release tablet, Take 5 mg by mouth every 4 (four) hours as needed for severe pain., Disp: , Rfl:    OXYGEN, Inhale 4-5 L into the lungs continuous. , Disp: , Rfl:    pantoprazole (PROTONIX) 20 MG tablet, TAKE 1 TABLET BY MOUTH  DAILY, Disp: 90 tablet, Rfl: 3   Polyethyl  Glycol-Propyl Glycol (SYSTANE) 0.4-0.3 % SOLN, Place 1 drop into both eyes every other day., Disp: , Rfl:    predniSONE (DELTASONE) 10 MG tablet, Please take 40 mg oral daily x 2 days, then 30 mg oral daily x 2 days, then 20 mg oral daily x 2 days, then back on home dose of 10 mg oral daily thereafter, Disp: 20 tablet, Rfl: 0   roflumilast (DALIRESP) 500 MCG TABS tablet, TAKE 1 TABLET BY MOUTH  DAILY (Patient taking differently: Take 500 mcg by mouth daily.), Disp: 90 tablet, Rfl: 3   rosuvastatin (CRESTOR) 20 MG tablet, Take 1 tablet (20 mg total) by mouth daily., Disp: 90 tablet, Rfl: 3   SPIRIVA RESPIMAT 2.5 MCG/ACT AERS, USE 2 INHALATIONS BY MOUTH  DAILY (Patient taking differently: Inhale 2 each into the lungs daily.), Disp: 12 g, Rfl: 3   SYMBICORT 160-4.5 MCG/ACT inhaler, USE 2 INHALATIONS BY MOUTH TWICE DAILY, Disp: 30.6 g, Rfl: 3   traMADol (ULTRAM) 50 MG tablet, Take 50 mg by mouth every 12 (twelve) hours as needed for severe pain., Disp: , Rfl:       Objective:   Vitals:   10/08/22 1439  BP: 126/68  Pulse: (!) 103  SpO2: 93%  Weight: 166 lb (75.3 kg)    Estimated body mass index is 22.51 kg/m as calculated from the following:   Height as of 09/11/22: 6' (1.829 m).   Weight as of this encounter: 166 lb (75.3 kg).  @WEIGHTCHANGE @  [employer]   10/08/22 1439  Weight: 166 lb (75.3 kg)     Physical Exam  General: No distress.  Deconditioned male sitting in the wheelchair.  Cushingoid facies. Neuro: Alert and Oriented x 3. GCS 15. Speech normal Psych: Pleasant Resp:  Barrel Chest - YEs.  Wheeze - no, Crackles - no, No overt respiratory distress CVS: Normal heart sounds. Murmurs - no Ext: Stigmata of Connective Tissue Disease - no.  Has bruises in the forearm consistent with steroid intake. HEENT: Normal upper airway. PEERL +. No post nasal drip        Assessment:       ICD-10-CM   1. Stage 4 very severe COPD by GOLD classification  J44.9     2. COPD, frequent  exacerbations  J44.1     3. Chronic respiratory failure with hypoxia  J96.11     4. COPD with acute exacerbation  J44.1     5. Eosinophilia, unspecified type  D72.10          Plan:     Patient Instructions  Nodule of lower lobe of right lung - 67mm new Jul 10, 2021. 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  CT Dec 2023 with response  and no tumor recurrence  Plan - per DR Julien Nordmann and Lisbeth Renshaw - CT scan timing  COPD with acute exacerbation ([place]) COPD, frequent exacerbations ([place]) Stage 4 very severe COPD by GOLD classification ([place]) Chronic respiratory failure with hypoxia ([place])  - early flare up 06/06/2022 and again 10/08/2022 - blood tests showing eosinophilia  Plan - get CXR 10/08/2022 - START DUPIXENT - respect NOT  [id number] HILL-ROM open bipap -  - Take doxycycline 100mg  po twice daily x 5 days; take after meals and avoid sunlight  - hold azithromycin during this time - Take prednisone 40 mg daily x 2 days, then 30mg  daily x 2 days, then 20mg  daily x 2 days, then 10mg  daily baseline to cntiue - Otherwise  continue  daily prednisone 10mg , azithromycin MWF, and roflumilast daily per routine   --Continue oxygen, Spiriva, Symbicort, Daliresp and 3 times weekly azithromycin and daily prednisone otherwise (after flare up Rx)     History Pulmonary embolism -   -normal d-dimer Jan 2023  Plan  - - continue low dose eliquis for now esp with cancer and leg edema      Follow-up -3 months with dR Makeisha Jentsch ; 15 min visit    SIGNATURE    Dr. Brand Males, M.D., F.C.C.P,  Pulmonary and Critical Care Medicine Staff Physician, [place] Director - Interstitial Lung Disease  Program  [facility] [place], [place], [address]  Pager: [phone], If no answer or between  15:00h - 7:00h: call [phone] Telephone: [phone]  3:30 PM 10/08/2022

## 2022-10-08 NOTE — Patient Instructions (Addendum)
Nodule of lower lobe of right lung - 80mm new Jul 10, 2021. 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  CT Dec 2023 with response and no tumor recurrence  Plan - per DR Julien Nordmann and Lisbeth Renshaw - CT scan timing  COPD with acute exacerbation ([place]) COPD, frequent exacerbations ([place]) Stage 4 very severe COPD by GOLD classification ([place]) Chronic respiratory failure with hypoxia ([place])  - early flare up 06/06/2022 and again 10/08/2022 - blood tests showing eosinophilia  Plan - get CXR 10/08/2022 - START DUPIXENT - respect NOT  [id number] HILL-ROM open bipap -  - Take doxycycline 100mg  po twice daily x 5 days; take after meals and avoid sunlight  - hold azithromycin during this time - Take prednisone 40 mg daily x 2 days, then 30mg  daily x 2 days, then 20mg  daily x 2 days, then 10mg  daily baseline to cntiue - Otherwise  continue  daily prednisone 10mg , azithromycin MWF, and roflumilast daily per routine   --Continue oxygen, Spiriva, Symbicort, Daliresp and 3 times weekly azithromycin and daily prednisone otherwise (after flare up Rx)     History Pulmonary embolism -   -normal d-dimer Jan 2023  Plan  - - continue low dose eliquis for now esp with cancer and leg edema      Follow-up -3 months with dR Novie Maggio ; 15 min visit

## 2022-10-10 ENCOUNTER — Other Ambulatory Visit: Payer: Self-pay | Admitting: *Deleted

## 2022-10-10 DIAGNOSIS — I739 Peripheral vascular disease, unspecified: Secondary | ICD-10-CM

## 2022-10-10 NOTE — Progress Notes (Signed)
Stable copd

## 2022-10-15 ENCOUNTER — Telehealth: Payer: Self-pay | Admitting: Pharmacist

## 2022-10-15 DIAGNOSIS — D721 Eosinophilia, unspecified: Secondary | ICD-10-CM

## 2022-10-15 DIAGNOSIS — J4489 Other specified chronic obstructive pulmonary disease: Secondary | ICD-10-CM

## 2022-10-15 NOTE — Progress Notes (Signed)
HISTORY AND PHYSICAL     CC:  follow up. Requesting Provider:  Shelva Majestic, MD  HPI: This is a 77 y.o. male who is here today for follow up for PAD.  Pt has hx of aortogram with right CIA stent 12/11/2018 by Dr. Darrick Penna for non healing wound.  He subsequently underwent right CFA endarterectomy with bovine patch angioplasty on 04/19/2019 by Dr. Darrick Penna. On 01/24/2020, he underwent left CFA endarterectomy, left CIA and EIA stent by Dr. Darrick Penna for CLI.   Pt was last seen 09/21/2020 and at that time, the wound on the left toe had essentially healed.  He was on home O2 for significant COPD.   The pt returns today for follow up with his wife.  He tells me he was recently hospitalized from a fall and had internal bleeding.  His wife states that while he was in the hospital, he had a lot of fluid removed and his legs looked good.  He is having significant swelling of both legs and he cannot wear compression.  He has zipper socks that go from ankle to the knee.  He states his legs are a little better in the mornings.  He does have a hospital bed that can be adjusted.  He has been working with PT to get back to baseline for the past 4 weeks.  He does get occasional pains in his feet.  He does not walk enough to elicit claudication as he is limited by his COPD and SOB.    The pt is on a statin for cholesterol management.    The pt is not on an aspirin.    Other AC:  Eliquis The pt is on diuretic for hypertension.  The pt is not on medication for diabetes. Tobacco hx:  former  Pt does not have family hx of AAA.  Past Medical History:  Diagnosis Date   Chronic back pain    "mid and lower" (04/07/2018)   Chronic rhinitis    -Sinus Ct 08/01/2009 >> Bilateral maxillary sinusitis with some mucosal thickeningin the sphenoid and frontal sinuses as well with air fluid levels present -chronic rhinitis flyer Aug 04, 2009   Compressed spine fracture    COPD (chronic obstructive pulmonary disease)    PFT's rec  Jul 17, 2009   Dyspnea    Emphysema lung    On home oxygen therapy    "3L; 24/7" (04/07/2018)   Onychomycosis    Dr. Melvyn Novas   Orthostatic hypotension    "since 10/2017" (04/07/2018)   PAD (peripheral artery disease)    Pneumonia    "twice in 1 year" (04/07/2018)   Pulmonary embolism 04/07/2018   Skin cancer    "lips, face, ears, arms" (04/07/2018)   Small cell lung cancer, right 2023   Dr. Mitzi Hansen.  rad   Vertigo    "since ~ 02/2018" (04/07/2018)    Past Surgical History:  Procedure Laterality Date   ABDOMINAL AORTOGRAM W/LOWER EXTREMITY N/A 01/21/2020   Procedure: ABDOMINAL AORTOGRAM W/LOWER EXTREMITY;  Surgeon: Sherren Kerns, MD;  Location: [facility] INVASIVE CV LAB;  Service: Cardiovascular;  Laterality: N/A;   APPENDECTOMY     BIOPSY  02/20/2020   Procedure: BIOPSY;  Surgeon: Meridee Score Netty Starring., MD;  Location: [facility] ENDOSCOPY;  Service: Gastroenterology;;   BIOPSY  08/12/2022   Procedure: BIOPSY;  Surgeon: Shellia Cleverly, DO;  Location: [facility] ENDOSCOPY;  Service: Gastroenterology;;   BRONCHIAL BIOPSY  09/04/2021   Procedure: BRONCHIAL BIOPSIES;  Surgeon: Josephine Igo, DO;  Location: [facility] ENDOSCOPY;  Service: Pulmonary;;   BRONCHIAL BRUSHINGS  09/04/2021   Procedure: BRONCHIAL BRUSHINGS;  Surgeon: Josephine IgoIcard, Bradley L, DO;  Location: [facility] ENDOSCOPY;  Service: Pulmonary;;   BRONCHIAL NEEDLE ASPIRATION BIOPSY  09/04/2021   Procedure: BRONCHIAL NEEDLE ASPIRATION BIOPSIES;  Surgeon: Josephine IgoIcard, Bradley L, DO;  Location: [facility] ENDOSCOPY;  Service: Pulmonary;;   CATARACT EXTRACTION, BILATERAL     ENDARTERECTOMY FEMORAL Right 04/19/2019   Procedure: ENDARTERECTOMY RIGHT COMMON FEMORAL;  Surgeon: Sherren KernsFields, Charles E, MD;  [facility];  Service: Vascular;  Laterality: Right;   ENDARTERECTOMY FEMORAL Left 01/24/2020   Procedure: LEFT FEMORAL ENDARTERECTOMY WITH DACRON PATCH ANGIOPLASTY;  Surgeon: Sherren KernsFields, Charles E, MD;  Location: [facility] OR;  Service: Vascular;  Laterality: Left;   ESOPHAGOGASTRODUODENOSCOPY (EGD)  WITH PROPOFOL N/A 02/20/2020   Procedure: ESOPHAGOGASTRODUODENOSCOPY (EGD) WITH PROPOFOL;  Surgeon: Lemar LoftyMansouraty, Gabriel Jr., MD;  [facility];  Service: Gastroenterology;  Laterality: N/A;   ESOPHAGOGASTRODUODENOSCOPY (EGD) WITH PROPOFOL N/A 08/12/2022   Procedure: ESOPHAGOGASTRODUODENOSCOPY (EGD) WITH PROPOFOL;  Surgeon: Shellia Cleverlyirigliano, Vito V, DO;  Location: [facility] ENDOSCOPY;  Service: Gastroenterology;  Laterality: N/A;   FEMORAL ENDARTERECTOMY Left 01/24/2020   FIDUCIAL MARKER PLACEMENT  09/04/2021   Procedure: FIDUCIAL MARKER PLACEMENT;  Surgeon: Josephine IgoIcard, Bradley L, DO;  Location: [facility] ENDOSCOPY;  Service: Pulmonary;;   HEMOSTASIS CLIP PLACEMENT  02/20/2020   Procedure: HEMOSTASIS CLIP PLACEMENT;  Surgeon: Lemar LoftyMansouraty, Gabriel Jr., MD;  [facility];  Service: Gastroenterology;;   HEMOSTASIS CLIP PLACEMENT  08/12/2022   Procedure: HEMOSTASIS CLIP PLACEMENT;  Surgeon: Shellia Cleverlyirigliano, Vito V, DO;  Location: [facility] ENDOSCOPY;  Service: Gastroenterology;;   HOT HEMOSTASIS N/A 02/20/2020   Procedure: HOT HEMOSTASIS (ARGON PLASMA COAGULATION/BICAP);  Surgeon: Lemar LoftyMansouraty, Gabriel Jr., MD;  [facility];  Service: Gastroenterology;  Laterality: N/A;   HOT HEMOSTASIS N/A 08/12/2022   Procedure: HOT HEMOSTASIS (ARGON PLASMA COAGULATION/BICAP);  Surgeon: Shellia Cleverlyirigliano, Vito V, DO;  [facility];  Service: Gastroenterology;  Laterality: N/A;   INSERTION OF ILIAC STENT Left 01/24/2020   Procedure: INSERTION OF VBX STENT 8X59 AND 8X39 LEFT COMMON ILIAC ARTERY. INSERTION OF INNOVA 7 X 60 INNOVA STENT LEFT EXTERNAL ILIAC ARTERY. ;  Surgeon: Sherren KernsFields, Charles E, MD;  [facility];  Service: Vascular;  Laterality: Left;   LOWER EXTREMITY ANGIOGRAPHY  12/11/2018   LOWER EXTREMITY ANGIOGRAPHY N/A 12/11/2018   Procedure: LOWER EXTREMITY ANGIOGRAPHY;  Surgeon: Sherren KernsFields, Charles E, MD;  [facility]: [facility] INVASIVE CV LAB;  Service: Cardiovascular;  Laterality: N/A;   PATCH ANGIOPLASTY Right 04/19/2019   Procedure:  Patch Angioplasty;  Surgeon: Sherren KernsFields, Charles E, MD;  [facility];  Service: Vascular;  Laterality: Right;   PERIPHERAL VASCULAR INTERVENTION Right 12/11/2018   Procedure: PERIPHERAL VASCULAR INTERVENTION;  Surgeon: Sherren KernsFields, Charles E, MD;  [facility]: [facility] INVASIVE CV LAB;  Service: Cardiovascular;  Laterality: Right;  Common Iliac    SKIN CANCER EXCISION     "lips, face, ears, arms" (04/07/2018)   SUBMUCOSAL INJECTION  08/12/2022   Procedure: SUBMUCOSAL INJECTION;  Surgeon: Shellia Cleverlyirigliano, Vito V, DO;  Location: [facility] ENDOSCOPY;  Service: Gastroenterology;;   TONSILLECTOMY     ULTRASOUND GUIDANCE FOR VASCULAR ACCESS Right 01/24/2020   Procedure: ULTRASOUND GUIDANCE FOR VASCULAR ACCESS;  Surgeon: Sherren KernsFields, Charles E, MD;  [facility];  Service: Vascular;  Laterality: Right;   VIDEO BRONCHOSCOPY WITH RADIAL ENDOBRONCHIAL ULTRASOUND  09/04/2021   Procedure: VIDEO BRONCHOSCOPY WITH RADIAL ENDOBRONCHIAL ULTRASOUND;  Surgeon: Josephine IgoIcard, Bradley L, DO;  Location: [facility] ENDOSCOPY;  Service: Pulmonary;;    Allergies  Allergen Reactions  Tape Other (See Comments)    Skin tears easily.   Augmentin [Amoxicillin-Pot Clavulanate] Other (See Comments)    Thrush Sore throat Hoarse voice   Cipro [Ciprofloxacin Hcl] Nausea And Vomiting    Weakness Fatigue    Gadavist [Gadobutrol] Other (See Comments)    Unknown reaction   Levaquin [Levofloxacin] Other (See Comments)    Hallucinations    Lipitor [Atorvastatin] Rash    Current Outpatient Medications  Medication Sig Dispense Refill   acetaminophen (TYLENOL) 500 MG tablet Take 500 mg by mouth every 4 (four) hours as needed for moderate pain, fever, headache or mild pain.     albuterol (PROVENTIL) (2.5 MG/3ML) 0.083% nebulizer solution Take 3 mLs (2.5 mg total) by nebulization every 6 (six) hours as needed for wheezing or shortness of breath. 75 mL 12   Calcium Carbonate-Vitamin D (CALCIUM 600+D PO) Take 2 tablets by mouth daily.     docusate sodium (COLACE) 100 MG  capsule Take 2 capsules (200 mg total) by mouth 2 (two) times daily. 10 capsule 0   doxycycline (VIBRA-TABS) 100 MG tablet Take 1 tablet (100 mg total) by mouth 2 (two) times daily. 10 tablet 0   ELIQUIS 2.5 MG TABS tablet TAKE 1 TABLET BY MOUTH TWICE  DAILY (Patient taking differently: Take 2.5 mg by mouth 2 (two) times daily.) 180 tablet 3   Ferrous Sulfate (IRON) 325 (65 Fe) MG TABS Take 325 mg by mouth every other day.     fluticasone (FLONASE) 50 MCG/ACT nasal spray USE 2 SPRAYS IN BOTH  NOSTRILS DAILY (Patient taking differently: Place 2 sprays into both nostrils daily.) 48 g 3   folic acid (FOLVITE) 1 MG tablet Take 1 tablet (1 mg total) by mouth daily.     furosemide (LASIX) 20 MG tablet Take 1 tablet (20 mg total) by mouth daily as needed for fluid or edema.     Multiple Vitamin (MULTIVITAMIN WITH MINERALS) TABS tablet Take 1 tablet by mouth daily.     mupirocin cream (BACTROBAN) 2 % Apply 1 Application topically 2 (two) times daily. 30 g 0   oxyCODONE (OXY IR/ROXICODONE) 5 MG immediate release tablet Take 5 mg by mouth every 4 (four) hours as needed for severe pain.     OXYGEN Inhale 4-5 L into the lungs continuous.      pantoprazole (PROTONIX) 20 MG tablet TAKE 1 TABLET BY MOUTH  DAILY 90 tablet 3   Polyethyl Glycol-Propyl Glycol (SYSTANE) 0.4-0.3 % SOLN Place 1 drop into both eyes every other day.     predniSONE (DELTASONE) 10 MG tablet Please take 40 mg oral daily x 2 days, then 30 mg oral daily x 2 days, then 20 mg oral daily x 2 days, then back on home dose of 10 mg oral daily thereafter 20 tablet 0   predniSONE (DELTASONE) 10 MG tablet Take 4 tablets (40 mg total) by mouth daily with breakfast for 2 days, THEN 3 tablets (30 mg total) daily with breakfast for 2 days, THEN 2 tablets (20 mg total) daily with breakfast for 2 days, THEN 1 tablet (10 mg total) daily with breakfast for 2 days. 20 tablet 0   roflumilast (DALIRESP) 500 MCG TABS tablet TAKE 1 TABLET BY MOUTH  DAILY (Patient  taking differently: Take 500 mcg by mouth daily.) 90 tablet 3   rosuvastatin (CRESTOR) 20 MG tablet Take 1 tablet (20 mg total) by mouth daily. 90 tablet 3   SPIRIVA RESPIMAT 2.5 MCG/ACT AERS USE 2 INHALATIONS BY MOUTH  DAILY (Patient taking differently: Inhale 2 each into the lungs daily.) 12 g 3   SYMBICORT 160-4.5 MCG/ACT inhaler USE 2 INHALATIONS BY MOUTH TWICE DAILY 30.6 g 3   traMADol (ULTRAM) 50 MG tablet Take 50 mg by mouth every 12 (twelve) hours as needed for severe pain.     No current facility-administered medications for this visit.    Family History  Problem Relation Age of Onset   Heart disease Mother        CABG in her 3s, nonsmoker   Cancer Mother    Stroke Father    Heart disease Father        Died of MI at age 57, smoker   Hepatitis Sister    Coronary artery disease Other        male 1st degree relative <60   Colon cancer Neg Hx    Pancreatic cancer Neg Hx    Esophageal cancer Neg Hx    Inflammatory bowel disease Neg Hx    Liver disease Neg Hx    Rectal cancer Neg Hx    Stomach cancer Neg Hx     Social History   Socioeconomic History   Marital status: Married    Spouse name: Not on file   Number of children: 2   Years of education: 3 years of college   Highest education level: Not on file  Occupational History    Employer: [employer]  Tobacco Use   Smoking status: Former    Packs/day: 2.00    Years: 52.00    Additional pack years: 0.00    Total pack years: 104.00    Types: Pipe, Cigarettes    Start date: 10/06/1965    Quit date: 10/06/2017    Years since quitting: 5.0   Smokeless tobacco: Never   Tobacco comments:    04/07/2018 "smoked cigarettes years ago; stopped ~ 20 yr ago; stopped smoking pipe in 09/2017"  Vaping Use   Vaping Use: Never used  Substance and Sexual Activity   Alcohol use: Yes    Alcohol/week: 11.0 standard drinks of alcohol    Types: 11 Cans of beer per week    Comment: 35 per week prior to fall january 2024   Drug use: No    Sexual activity: Not Currently  Other Topics Concern   Not on file  Social History Narrative   Married (wife Jonna Clark patient of Dr. Durene Cal) with children (2-son and daughter). 1 grandson.       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       Hobbies: fishing, projects at home   Social Determinants of Corporate investment banker Strain: Not on file  Food Insecurity: Not on file  Transportation Needs: Not on file  Physical Activity: Not on file  Stress: Not on file  Social Connections: Not on file  Intimate Partner Violence: Not on file     REVIEW OF SYSTEMS:   [X]  denotes positive finding, [ ]  denotes negative finding Cardiac  Comments:  Chest pain or chest pressure:    Shortness of breath upon exertion:    Short of breath when lying flat:    Irregular heart rhythm:        Vascular    Pain in calf, thigh, or hip brought on by ambulation:    Pain in feet at night that wakes you up from your sleep:     Blood clot in your veins:    Leg swelling:  x       Pulmonary    Oxygen at home: x   Productive cough:     Wheezing:         Neurologic    Sudden weakness in arms or legs:     Sudden numbness in arms or legs:     Sudden onset of difficulty speaking or slurred speech:    Temporary loss of vision in one eye:     Problems with dizziness:         Gastrointestinal    Blood in stool:     Vomited blood:         Genitourinary    Burning when urinating:     Blood in urine:        Psychiatric    Major depression:         Hematologic    Bleeding problems:    Problems with blood clotting too easily:        Skin    Rashes or ulcers:        Constitutional    Fever or chills:      PHYSICAL EXAMINATION:  Today's Vitals   10/18/22 1257  BP: 124/82  Pulse: (!) 107  Resp: 20  Temp: 98.2 F (36.8 C)  TempSrc: Temporal  SpO2: 93%  Height: 6' (1.829 m)   Body mass index is 22.51 kg/m.   General:  NAD; vital signs documented  above Gait: Not observed HENT: WNL, normocephalic Pulmonary: normal non-labored breathing , without wheezing Cardiac: regular HR, without carotid bruits Abdomen: soft, NT; aortic pulse is not palpable Skin: without rashes Vascular Exam/Pulses: Brisk biphasic doppler flow bilateral DP/PT Extremities:   Hyperplasia of BLE with +stemmer sign  Musculoskeletal: no muscle wasting or atrophy  Neurologic: A&O X 3 Psychiatric:  The pt has Normal affect.   Non-Invasive Vascular Imaging:   ABI's/TBI's on 10/18/2022: Right:  0.79/absent - Great toe pressure: absent Left:  0.95/absent - Great toe pressure: absent   Previous ABI's/TBI's on 09/21/2020: Right:  0.95/0.69 - Great toe pressure: 125 Left:  0.96/0.48 - Great toe pressure:  87     ASSESSMENT/PLAN:: 77 y.o. male here for follow up for PAD with hx of  aortogram with right CIA stent 12/11/2018 by Dr. Darrick Penna for non healing wound.  He subsequently underwent right CFA endarterectomy with bovine patch angioplasty on 04/19/2019 by Dr. Darrick Penna. On 01/24/2020, he underwent left CFA endarterectomy, left CIA and EIA stent by Dr. Darrick Penna for CLI.    -pt with brisk doppler flow bilateral feet.  ABI on the right is slightly decreased from previous ABI.   -he does have significant swelling with + stemmer sign indicative of lymphedema.  He has hyperplasia of BLE.  He has been working with PT for 4 weeks and wearing zipper compression.  He has been elevating his legs but not adequately.  I feel he will get benefit from pneumatic compression.  He was measured for compression today and instructed on proper leg elevation as he can tolerate given his COPD.  He will continue to exercise as tolerated. He will f/u in 4 weeks with BLE venous reflux study and see MD.  Hopefully at that time, we can set him up for pneumatic compression devices.  -he will f/u in one year with ABI and aortoiliac duplex to evaluate his iliac artery stents.   -feel he would definitely  benefit from continued physical therapy.     Doreatha Massed, [facility] [phone]  Clinic MD:   Karin Lieu on call MD

## 2022-10-15 NOTE — Telephone Encounter (Signed)
Received new start paperwork for Dupixent. Placed in PAP pending info folder in pharmacy office  Submitted a Prior Authorization request to [facility] for DUPIXENT via CoverMyMeds. Will update once we receive a response.  Key: Mingo Amber, PharmD, MPH, BCPS, CPP Clinical Pharmacist (Rheumatology and Pulmonology)

## 2022-10-16 ENCOUNTER — Encounter: Payer: Self-pay | Admitting: Physician Assistant

## 2022-10-16 ENCOUNTER — Other Ambulatory Visit (HOSPITAL_COMMUNITY): Payer: Self-pay

## 2022-10-16 NOTE — Telephone Encounter (Signed)
Received notification from [facility] regarding a prior authorization for DUPIXENT. Authorization has been APPROVED from 10/15/22 to 04/16/23.  Called insurance to request PA approval letter be faxed to clinic  Per test claim, copay for 28 days supply is $65 (for loading dose and maintenance dose). Copay for 84-day supply is $195.  Patient can fill through [facility]: [phone]   Authorization # [id number]  I spoke with patient. He would like to try to apply for Dupixent PAP. He states he dropped off Dupixent paperwork to clinic. This has not been received by pharmacy team and likely placed in Dr. Jane Canary mailbox but was not located today in mailbox so may have in his office?  Chesley Mires, PharmD, MPH, BCPS, CPP Clinical Pharmacist (Rheumatology and Pulmonology)

## 2022-10-16 NOTE — Telephone Encounter (Signed)
Dupixent PA approval letter received. Sent to pt's chart once PAP forms are received

## 2022-10-18 ENCOUNTER — Encounter: Payer: Self-pay | Admitting: Physician Assistant

## 2022-10-18 ENCOUNTER — Ambulatory Visit (INDEPENDENT_AMBULATORY_CARE_PROVIDER_SITE_OTHER): Payer: Medicare Other | Admitting: Physician Assistant

## 2022-10-18 ENCOUNTER — Ambulatory Visit (HOSPITAL_COMMUNITY)
Admission: RE | Admit: 2022-10-18 | Discharge: 2022-10-18 | Disposition: A | Discharge status: Home / Self Care | Payer: Medicare Other | Source: Ambulatory Visit | Attending: Vascular Surgery | Admitting: Vascular Surgery

## 2022-10-18 VITALS — BP 124/82 | HR 107 | Temp 98.2°F | Resp 20 | Ht 72.0 in

## 2022-10-18 DIAGNOSIS — I739 Peripheral vascular disease, unspecified: Secondary | ICD-10-CM | POA: Insufficient documentation

## 2022-10-18 DIAGNOSIS — I890 Lymphedema, not elsewhere classified: Secondary | ICD-10-CM | POA: Diagnosis not present

## 2022-10-18 LAB — VAS US ABI WITH/WO TBI
Left ABI: 0.95
Right ABI: 0.79

## 2022-10-23 DIAGNOSIS — M7989 Other specified soft tissue disorders: Secondary | ICD-10-CM

## 2022-10-29 ENCOUNTER — Other Ambulatory Visit: Payer: Self-pay

## 2022-10-29 DIAGNOSIS — I890 Lymphedema, not elsewhere classified: Secondary | ICD-10-CM

## 2022-11-06 ENCOUNTER — Ambulatory Visit (HOSPITAL_COMMUNITY)
Admission: RE | Admit: 2022-11-06 | Discharge: 2022-11-06 | Disposition: A | Discharge status: Home / Self Care | Payer: Medicare Other | Source: Ambulatory Visit | Attending: Vascular Surgery | Admitting: Vascular Surgery

## 2022-11-06 ENCOUNTER — Ambulatory Visit (INDEPENDENT_AMBULATORY_CARE_PROVIDER_SITE_OTHER): Payer: Medicare Other | Admitting: Vascular Surgery

## 2022-11-06 ENCOUNTER — Encounter: Payer: Self-pay | Admitting: Vascular Surgery

## 2022-11-06 VITALS — BP 125/82 | HR 104 | Temp 98.2°F | Wt 165.0 lb

## 2022-11-06 DIAGNOSIS — I890 Lymphedema, not elsewhere classified: Secondary | ICD-10-CM

## 2022-11-06 NOTE — Progress Notes (Addendum)
Patient ID: Francisco [name], male   DOB: [birth year], 77 y.o.   MRN: [id number]  Reason for Consult: No chief complaint on file.   Referred by Shelva Majestic, MD  Subjective:     HPI:  Francisco [name] is a 77 y.o. male history of right common iliac artery stent with subsequent right common femoral endarterectomy and left common femoral endarterectomy with left common external iliac artery stents all performed by Dr. Darrick Penna in the past.  He was recently evaluated here for bilateral lower extremity swelling with skin discoloration.  He has been wearing compression stockings but states that it make his feet feel somewhat cold.  He does have a hospital bed and elevates his legs much of the day.  He does have weeping of the skin no frank wounds at this time.  He does exercise is much as tolerated but is limited by COPD with need for home oxygen.  He is here today with venous reflux testing.  Past Medical History:  Diagnosis Date   Chronic back pain    "mid and lower" (04/07/2018)   Chronic rhinitis    -Sinus Ct 08/01/2009 >> Bilateral maxillary sinusitis with some mucosal thickeningin the sphenoid and frontal sinuses as well with air fluid levels present -chronic rhinitis flyer Aug 04, 2009   Compressed spine fracture [facility])    COPD (chronic obstructive pulmonary disease) (HCC)    PFT's rec Jul 17, 2009   Dyspnea    Emphysema lung (HCC)    On home oxygen therapy    "3L; 24/7" (04/07/2018)   Onychomycosis    Dr. Melvyn Novas   Orthostatic hypotension    "since 10/2017" (04/07/2018)   PAD (peripheral artery disease) (HCC)    Pneumonia    "twice in 1 year" (04/07/2018)   Pulmonary embolism (HCC) 04/07/2018   Skin cancer    "lips, face, ears, arms" (04/07/2018)   Small cell lung cancer, right (HCC) 2023   Dr. Mitzi Hansen.  rad   Vertigo    "since ~ 02/2018" (04/07/2018)   Family History  Problem Relation Age of Onset   Heart disease Mother        CABG in her 19s, nonsmoker   Cancer Mother     Stroke Father    Heart disease Father        Died of MI at age 80, smoker   Hepatitis Sister    Coronary artery disease Other        male 1st degree relative <60   Colon cancer Neg Hx    Pancreatic cancer Neg Hx    Esophageal cancer Neg Hx    Inflammatory bowel disease Neg Hx    Liver disease Neg Hx    Rectal cancer Neg Hx    Stomach cancer Neg Hx    Past Surgical History:  Procedure Laterality Date   ABDOMINAL AORTOGRAM W/LOWER EXTREMITY N/A 01/21/2020   Procedure: ABDOMINAL AORTOGRAM W/LOWER EXTREMITY;  Surgeon: Sherren Kerns, MD;  Location: [facility];  Service: Cardiovascular;  Laterality: N/A;   APPENDECTOMY     BIOPSY  02/20/2020   Procedure: BIOPSY;  Surgeon: Meridee Score Netty Starring., MD;  Location: [facility] ENDOSCOPY;  Service: Gastroenterology;;   BIOPSY  08/12/2022   Procedure: BIOPSY;  Surgeon: Shellia Cleverly, DO;  Location: [facility] ENDOSCOPY;  Service: Gastroenterology;;   BRONCHIAL BIOPSY  09/04/2021   Procedure: BRONCHIAL BIOPSIES;  Surgeon: Josephine Igo, DO;  Location: [facility] ENDOSCOPY;  Service: Pulmonary;;   BRONCHIAL  BRUSHINGS  09/04/2021   Procedure: BRONCHIAL BRUSHINGS;  Surgeon: Josephine Igo, DO;  Location: [facility] ENDOSCOPY;  Service: Pulmonary;;   BRONCHIAL NEEDLE ASPIRATION BIOPSY  09/04/2021   Procedure: BRONCHIAL NEEDLE ASPIRATION BIOPSIES;  Surgeon: Josephine Igo, DO;  Location: [facility] ENDOSCOPY;  Service: Pulmonary;;   CATARACT EXTRACTION, BILATERAL     ENDARTERECTOMY FEMORAL Right 04/19/2019   Procedure: ENDARTERECTOMY RIGHT COMMON FEMORAL;  Surgeon: Sherren Kerns, MD;  Location: [facility] OR;  Service: Vascular;  Laterality: Right;   ENDARTERECTOMY FEMORAL Left 01/24/2020   Procedure: LEFT FEMORAL ENDARTERECTOMY WITH DACRON PATCH ANGIOPLASTY;  Surgeon: Sherren Kerns, MD;  Location: [facility] OR;  Service: Vascular;  Laterality: Left;   ESOPHAGOGASTRODUODENOSCOPY (EGD) WITH PROPOFOL N/A 02/20/2020   Procedure: ESOPHAGOGASTRODUODENOSCOPY (EGD) WITH PROPOFOL;   Surgeon: Lemar Lofty., MD;  Location: [facility] ENDOSCOPY;  Service: Gastroenterology;  Laterality: N/A;   ESOPHAGOGASTRODUODENOSCOPY (EGD) WITH PROPOFOL N/A 08/12/2022   Procedure: ESOPHAGOGASTRODUODENOSCOPY (EGD) WITH PROPOFOL;  Surgeon: Shellia Cleverly, DO;  [facility]OS[facility];  Service: Gastroenterology;  Laterality: N/A;   FEMORAL ENDARTERECTOMY Left 01/24/2020   FIDUCIAL MARKER PLACEMENT  09/04/2021   Procedure: FIDUCIAL MARKER PLACEMENT;  Surgeon: Josephine Igo, DO;  Location: [facility] ENDOSCOPY;  Service: Pulmonary;;   HEMOSTASIS CLIP PLACEMENT  02/20/2020   Procedure: HEMOSTASIS CLIP PLACEMENT;  Surgeon: Lemar Lofty., MD;  Location: [facility] ENDOSCOPY;  Service: Gastroenterology;;   HEMOSTASIS CLIP PLACEMENT  08/12/2022   Procedure: HEMOSTASIS CLIP PLACEMENT;  Surgeon: Shellia Cleverly, DO;  [facility] ENDOS[facility];  Service: Gastroenterology;;   HOT HEMOSTASIS N/A 02/20/2020   Procedure: HOT HEMOSTASIS (ARGON PLASMA COAGULATION/BICAP);  Surgeon: Lemar Lofty., MD;  Location: [facility] ENDOSCOPY;  Service: Gastroenterology;  Laterality: N/A;   HOT HEMOSTASIS N/A 08/12/2022   Procedure: HOT HEMOSTASIS (ARGON PLASMA COAGULATION/BICAP);  Surgeon: Shellia Cleverly, DO;  Location: [facility] ENDOSCOPY;  Service: Gastroenterology;  Laterality: N/A;   INSERTION OF ILIAC STENT Left 01/24/2020   Procedure: INSERTION OF VBX STENT 8X59 AND 8X39 LEFT COMMON ILIAC ARTERY. INSERTION OF INNOVA 7 X 60 INNOVA STENT LEFT EXTERNAL ILIAC ARTERY. ;  Surgeon: Sherren Kerns, MD;  Location: [facility] OR;  Service: Vascular;  Laterality: Left;   LOWER EXTREMITY ANGIOGRAPHY  12/11/2018   LOWER EXTREMITY ANGIOGRAPHY N/A 12/11/2018   Procedure: LOWER EXTREMITY ANGIOGRAPHY;  Surgeon: Sherren Kerns, MD;  [facility];  Service: Cardiovascular;  Laterality: N/A;   PATCH ANGIOPLASTY Right 04/19/2019   Procedure: Patch Angioplasty;  Surgeon: Sherren Kerns, MD;  Location: [facility] OR;  Service: Vascular;   Laterality: Right;   PERIPHERAL VASCULAR INTERVENTION Right 12/11/2018   Procedure: PERIPHERAL VASCULAR INTERVENTION;  Surgeon: Sherren Kerns, MD;  [facility]: [facility];  Service: Cardiovascular;  Laterality: Right;  Common Iliac    SKIN CANCER EXCISION     "lips, face, ears, arms" (04/07/2018)   SUBMUCOSAL INJECTION  08/12/2022   Procedure: SUBMUCOSAL INJECTION;  Surgeon: Shellia Cleverly, DO;  Location: [facility] ENDOSCOPY;  Service: Gastroenterology;;   TONSILLECTOMY     ULTRASOUND GUIDANCE FOR VASCULAR ACCESS Right 01/24/2020   Procedure: ULTRASOUND GUIDANCE FOR VASCULAR ACCESS;  Surgeon: Sherren Kerns, MD;  Location: [facility] OR;  Service: Vascular;  Laterality: Right;   VIDEO BRONCHOSCOPY WITH RADIAL ENDOBRONCHIAL ULTRASOUND  09/04/2021   Procedure: VIDEO BRONCHOSCOPY WITH RADIAL ENDOBRONCHIAL ULTRASOUND;  Surgeon: Josephine Igo, DO;  Location: [facility] ENDOSCOPY;  Service: Pulmonary;;    Short Social History:  Social History   Tobacco Use   Smoking  status: Former    Packs/day: 2.00    Years: 52.00    Additional pack years: 0.00    Total pack years: 104.00    Types: Pipe, Cigarettes    Start date: 10/06/1965    Quit date: 10/06/2017    Years since quitting: 5.0    Passive exposure: Never   Smokeless tobacco: Never   Tobacco comments:    04/07/2018 "smoked cigarettes years ago; stopped ~ 20 yr ago; stopped smoking pipe in 09/2017"  Substance Use Topics   Alcohol use: Yes    Alcohol/week: 11.0 standard drinks of alcohol    Types: 11 Cans of beer per week    Comment: 35 per week prior to fall january 2024    Allergies  Allergen Reactions   Tape Other (See Comments)    Skin tears easily.   Augmentin [Amoxicillin-Pot Clavulanate] Other (See Comments)    Thrush Sore throat Hoarse voice   Cipro [Ciprofloxacin Hcl] Nausea And Vomiting    Weakness Fatigue    Gadavist [Gadobutrol] Other (See Comments)    Unknown reaction   Levaquin [Levofloxacin] Other (See Comments)     Hallucinations    Lipitor [Atorvastatin] Rash    Current Outpatient Medications  Medication Sig Dispense Refill   acetaminophen (TYLENOL) 500 MG tablet Take 500 mg by mouth every 4 (four) hours as needed for moderate pain, fever, headache or mild pain.     albuterol (PROVENTIL) (2.5 MG/3ML) 0.083% nebulizer solution Take 3 mLs (2.5 mg total) by nebulization every 6 (six) hours as needed for wheezing or shortness of breath. 75 mL 12   Calcium Carbonate-Vitamin D (CALCIUM 600+D PO) Take 2 tablets by mouth daily.     docusate sodium (COLACE) 100 MG capsule Take 2 capsules (200 mg total) by mouth 2 (two) times daily. 10 capsule 0   doxycycline (VIBRA-TABS) 100 MG tablet Take 1 tablet (100 mg total) by mouth 2 (two) times daily. 10 tablet 0   ELIQUIS 2.5 MG TABS tablet TAKE 1 TABLET BY MOUTH TWICE  DAILY (Patient taking differently: Take 2.5 mg by mouth 2 (two) times daily.) 180 tablet 3   Ferrous Sulfate (IRON) 325 (65 Fe) MG TABS Take 325 mg by mouth every other day.     fluticasone (FLONASE) 50 MCG/ACT nasal spray USE 2 SPRAYS IN BOTH  NOSTRILS DAILY (Patient taking differently: Place 2 sprays into both nostrils daily.) 48 g 3   folic acid (FOLVITE) 1 MG tablet Take 1 tablet (1 mg total) by mouth daily.     furosemide (LASIX) 20 MG tablet Take 1 tablet (20 mg total) by mouth daily as needed for fluid or edema.     Multiple Vitamin (MULTIVITAMIN WITH MINERALS) TABS tablet Take 1 tablet by mouth daily.     mupirocin cream (BACTROBAN) 2 % Apply 1 Application topically 2 (two) times daily. 30 g 0   oxyCODONE (OXY IR/ROXICODONE) 5 MG immediate release tablet Take 5 mg by mouth every 4 (four) hours as needed for severe pain.     OXYGEN Inhale 4-5 L into the lungs continuous.      pantoprazole (PROTONIX) 20 MG tablet TAKE 1 TABLET BY MOUTH  DAILY 90 tablet 3   Polyethyl Glycol-Propyl Glycol (SYSTANE) 0.4-0.3 % SOLN Place 1 drop into both eyes every other day.     predniSONE (DELTASONE) 10 MG tablet  Please take 40 mg oral daily x 2 days, then 30 mg oral daily x 2 days, then 20 mg oral daily x 2  days, then back on home dose of 10 mg oral daily thereafter 20 tablet 0   roflumilast (DALIRESP) 500 MCG TABS tablet TAKE 1 TABLET BY MOUTH  DAILY (Patient taking differently: Take 500 mcg by mouth daily.) 90 tablet 3   rosuvastatin (CRESTOR) 20 MG tablet Take 1 tablet (20 mg total) by mouth daily. 90 tablet 3   SPIRIVA RESPIMAT 2.5 MCG/ACT AERS USE 2 INHALATIONS BY MOUTH  DAILY (Patient taking differently: Inhale 2 each into the lungs daily.) 12 g 3   SYMBICORT 160-4.5 MCG/ACT inhaler USE 2 INHALATIONS BY MOUTH TWICE DAILY 30.6 g 3   traMADol (ULTRAM) 50 MG tablet Take 50 mg by mouth every 12 (twelve) hours as needed for severe pain.     No current facility-administered medications for this visit.    Review of Systems  Constitutional:  Constitutional negative. HENT: HENT negative.  Eyes: Eyes negative.  Respiratory: Positive for shortness of breath and wheezing.  Cardiovascular: Positive for leg swelling.  GI: Gastrointestinal negative.  Skin:       Purple discoloration Neurological: Neurological negative. Hematologic: Positive for bruises/bleeds easily.  Psychiatric: Psychiatric negative.        Objective:  Objective   Vitals:   11/06/22 1540  BP: 125/82  Pulse: (!) 104  Temp: 98.2 F (36.8 C)  SpO2: 90%     Physical Exam HENT:     Head: Normocephalic.  Eyes:     Pupils: Pupils are equal, round, and reactive to light.  Cardiovascular:     Rate and Rhythm: Normal rate.     Pulses:          Femoral pulses are 2+ on the right side and 2+ on the left side.      Popliteal pulses are 2+ on the right side and 2+ on the left side.  Pulmonary:     Comments: On 3 L nasal cannula Abdominal:     General: Abdomen is flat.     Palpations: Abdomen is soft.  Musculoskeletal:     Right lower leg: Edema present.     Left lower leg: Edema present.     Comments: Positive Stemmer sign  bilaterally  Neurological:     Mental Status: He is alert.  Psychiatric:        Mood and Affect: Mood normal.        Thought Content: Thought content normal.     Data: Venous Reflux Times  +--------------+---------+------+-----------+------------+-------------+  RIGHT        Reflux NoRefluxReflux TimeDiameter cmsComments                               Yes                                        +--------------+---------+------+-----------+------------+-------------+  CFV                    yes   >1 second                            +--------------+---------+------+-----------+------------+-------------+  FV prox       no                                                   +--------------+---------+------+-----------+------------+-------------+  FV mid        no                                                   +--------------+---------+------+-----------+------------+-------------+  FV dist       no                                                   +--------------+---------+------+-----------+------------+-------------+  GSV at [facility]    no                            0.51                   +--------------+---------+------+-----------+------------+-------------+  GSV prox thighno                            0.51                   +--------------+---------+------+-----------+------------+-------------+  GSV mid thigh no                            0.39    out of fascia  +--------------+---------+------+-----------+------------+-------------+  GSV dist thighno                            0.39    out of fascia  +--------------+---------+------+-----------+------------+-------------+  GSV at knee   no                            0.42    out of fascia  +--------------+---------+------+-----------+------------+-------------+  GSV prox calf no                            0.56    out of fascia   +--------------+---------+------+-----------+------------+-------------+  SSV Pop Fossa           yes    >500 ms      0.21                   +--------------+---------+------+-----------+------------+-------------+  SSV prox calf no                            0.10                   +--------------+---------+------+-----------+------------+-------------+  PFV                    yes  > 1 second                            +--------------+---------+------+-----------+------------+-------------+     +--------------+---------+------+-----------+------------+--------+  LEFT         Reflux NoRefluxReflux TimeDiameter cmsComments                          Yes                                   +--------------+---------+------+-----------+------------+--------+  CFV          no                                              +--------------+---------+------+-----------+------------+--------+  FV prox       no                                              +--------------+---------+------+-----------+------------+--------+  FV mid        no                                              +--------------+---------+------+-----------+------------+--------+  FV dist       no                                              +--------------+---------+------+-----------+------------+--------+  Popliteal    no                                              +--------------+---------+------+-----------+------------+--------+  GSV at [facility]    no                            0.44              +--------------+---------+------+-----------+------------+--------+  GSV prox thighno                            0.74              +--------------+---------+------+-----------+------------+--------+  GSV mid thigh no                            0.54              +--------------+---------+------+-----------+------------+--------+  GSV dist thighno                             0.54              +--------------+---------+------+-----------+------------+--------+  GSV at knee   no                            0.57              +--------------+---------+------+-----------+------------+--------+  GSV prox calf no                            0.45              +--------------+---------+------+-----------+------------+--------+  SSV Pop Fossa no                            0.14              +--------------+---------+------+-----------+------------+--------+  SSV prox calf no                            0.20              +--------------+---------+------+-----------+------------+--------+         Summary:  Right:  - No evidence of deep vein thrombosis seen in the right lower extremity,  from the common femoral through the popliteal veins.  - No evidence of superficial venous thrombosis in the right lower  extremity.    - No evidence of superficial venous reflux seen in the right greater  saphenous vein.    - Venous reflux is noted in the right common femoral vein.  - Venous reflux is noted in the right profunda femoral vein.  - Venous reflux is noted in the right short saphenous vein.    Left:  - No evidence of deep vein thrombosis seen in the left lower extremity,  from the common femoral through the popliteal veins.  - No evidence of superficial venous thrombosis in the left lower  extremity.    - There is no evidence of venous reflux seen in the left lower extremity.    - No evidence of superficial venous reflux seen in the left greater  saphenous vein.  - No evidence of superficial venous reflux seen in the left short  saphenous vein.   Assessment/Plan:    77 year old male with a history of lower extremity arterial revascularization procedures as noted above.  He now has swelling of his bilateral lower extremities with dark discoloration hyperpigmentation with weeping consistent with lymphorrhea and  also has hyperkeratosis.  He has been wearing compression stockings religiously and elevates his legs while he is in the hospital bed. Patient has tried 4 WEEKS of ELEVATION, EXERCISE, and COMPRESSION which have not resulted in significant reduction in swelling.    His activity level is somewhat limited by his COPD and need for home oxygenation.  Given his obvious lymphedema on physical exam with negative venous reflux studies I have recommended lymphedema pumps given his religious use of compression stockings as well as elevation with no improvement.  He will otherwise follow-up in 1 year with aortoiliac duplex and ABIs.     Maeola Harman MD [facility]

## 2022-11-15 ENCOUNTER — Other Ambulatory Visit: Payer: Self-pay

## 2022-11-15 DIAGNOSIS — I739 Peripheral vascular disease, unspecified: Secondary | ICD-10-CM

## 2022-11-18 ENCOUNTER — Other Ambulatory Visit: Payer: Self-pay | Admitting: Family Medicine

## 2022-11-25 ENCOUNTER — Other Ambulatory Visit: Payer: Self-pay | Admitting: Internal Medicine

## 2022-11-26 ENCOUNTER — Telehealth: Payer: Self-pay | Admitting: Internal Medicine

## 2022-11-26 NOTE — Telephone Encounter (Signed)
Called patient regarding June appointment, patient is notified.  

## 2022-11-28 ENCOUNTER — Encounter: Payer: Self-pay | Admitting: Family Medicine

## 2022-12-24 ENCOUNTER — Telehealth: Payer: Self-pay | Admitting: Medical Oncology

## 2022-12-24 ENCOUNTER — Inpatient Hospital Stay: Payer: Medicare Other | Attending: Internal Medicine

## 2022-12-24 ENCOUNTER — Other Ambulatory Visit: Payer: Self-pay

## 2022-12-24 ENCOUNTER — Ambulatory Visit (HOSPITAL_COMMUNITY)
Admission: RE | Admit: 2022-12-24 | Discharge: 2022-12-24 | Disposition: A | Discharge status: Home / Self Care | Payer: Medicare Other | Source: Ambulatory Visit | Attending: Internal Medicine | Admitting: Internal Medicine

## 2022-12-24 ENCOUNTER — Other Ambulatory Visit: Payer: Self-pay | Admitting: Radiation Oncology

## 2022-12-24 DIAGNOSIS — C3431 Malignant neoplasm of lower lobe, right bronchus or lung: Secondary | ICD-10-CM

## 2022-12-24 LAB — CMP (CANCER CENTER ONLY)
ALT: 15 U/L (ref 0–44)
AST: 23 U/L (ref 15–41)
Albumin: 3.7 g/dL (ref 3.5–5.0)
Alkaline Phosphatase: 55 U/L (ref 38–126)
Anion gap: 6 (ref 5–15)
BUN: 13 mg/dL (ref 8–23)
CO2: 38 mmol/L — ABNORMAL HIGH (ref 22–32)
Calcium: 9.9 mg/dL (ref 8.9–10.3)
Chloride: 98 mmol/L (ref 98–111)
Creatinine: 0.94 mg/dL (ref 0.61–1.24)
GFR, Estimated: >60 mL/min (ref >60)
Glucose, Bld: 122 mg/dL — ABNORMAL HIGH (ref 70–99)
Potassium: 4.6 mmol/L (ref 3.5–5.1)
Sodium: 142 mmol/L (ref 135–145)
Total Bilirubin: 0.5 mg/dL (ref 0.3–1.2)
Total Protein: 6.1 g/dL — ABNORMAL LOW (ref 6.5–8.1)

## 2022-12-24 LAB — CBC WITH DIFFERENTIAL (CANCER CENTER ONLY)
Abs Immature Granulocytes: 0.08 10*3/uL — ABNORMAL HIGH (ref 0.00–0.07)
Basophils Absolute: 0 10*3/uL (ref 0.0–0.1)
Basophils Relative: 0 %
Eosinophils Absolute: 0.2 10*3/uL (ref 0.0–0.5)
Eosinophils Relative: 2 %
HCT: 34.7 % — ABNORMAL LOW (ref 39.0–52.0)
Hemoglobin: 11.1 g/dL — ABNORMAL LOW (ref 13.0–17.0)
Immature Granulocytes: 1 %
Lymphocytes Relative: 5 %
Lymphs Abs: 0.6 10*3/uL — ABNORMAL LOW (ref 0.7–4.0)
MCH: 31.6 pg (ref 26.0–34.0)
MCHC: 32 g/dL (ref 30.0–36.0)
MCV: 98.9 fL (ref 80.0–100.0)
Monocytes Absolute: 0.6 10*3/uL (ref 0.1–1.0)
Monocytes Relative: 5 %
Neutro Abs: 10.2 10*3/uL — ABNORMAL HIGH (ref 1.7–7.7)
Neutrophils Relative %: 87 %
Platelet Count: 156 10*3/uL (ref 150–400)
RBC: 3.51 MIL/uL — ABNORMAL LOW (ref 4.22–5.81)
RDW: 13.8 % (ref 11.5–15.5)
WBC Count: 11.6 10*3/uL — ABNORMAL HIGH (ref 4.0–10.5)
nRBC: 0 % (ref 0.0–0.2)

## 2022-12-24 MED ORDER — IOHEXOL 300 MG/ML  SOLN
75.0000 mL | Freq: Once | INTRAMUSCULAR | Status: AC | PRN
  Administered 2022-12-24: 75 mL via INTRAVENOUS

## 2022-12-24 MED ORDER — PREDNISONE 50 MG PO TABS: ORAL_TABLET | ORAL | 0 refills | Status: DC

## 2022-12-24 NOTE — Telephone Encounter (Signed)
06/20- requests to change appt from in person to virtual visit for annual CT scan results. Is that ok?

## 2022-12-25 ENCOUNTER — Telehealth: Payer: Self-pay | Admitting: Medical Oncology

## 2022-12-25 NOTE — Telephone Encounter (Signed)
Pt notified that his appt tomorrow is virtual.

## 2022-12-26 ENCOUNTER — Inpatient Hospital Stay (HOSPITAL_BASED_OUTPATIENT_CLINIC_OR_DEPARTMENT_OTHER): Payer: Medicare Other | Admitting: Internal Medicine

## 2022-12-26 DIAGNOSIS — C3490 Malignant neoplasm of unspecified part of unspecified bronchus or lung: Secondary | ICD-10-CM | POA: Diagnosis not present

## 2022-12-26 NOTE — Progress Notes (Signed)
[facility] [phone]   [phone]  PROGRESS NOTE FOR TELEMEDICINE VISITS  Shelva Majestic, MD [address] [place] [address]  I connected withNAME@ on 12/26/22 at  3:15 PM EDT by video enabled telemedicine visit and verified that I am speaking with the correct person using two identifiers.   I discussed the limitations, risks, security and privacy concerns of performing an evaluation and management service by telemedicine and the availability of in-person appointments. I also discussed with the patient that there may be a patient responsible charge related to this service. The patient expressed understanding and agreed to proceed.  Other persons participating in the visit and their role in the encounter:  wife  Patient's location: Home Provider's location: [facility]  DIAGNOSIS:  limited stage (T1a, N0, M0) small cell lung cancer presented with 0.9 cm superior segment right lower lobe pulmonary nodule diagnosed in February 2023.   PRIOR THERAPY: SBRT to the right lower lobe lung nodule under the care of Dr. Mitzi Hansen   CURRENT THERAPY: Observation  INTERVAL HISTORY: Francisco [name] 77 y.o. male has a MyChart video virtual visit with me today for evaluation and discussion of his scan results.  The patient is feeling fine today with no concerning complaints except for the baseline shortness of breath and he is currently on home oxygen.  He is recovering from COPD flare and he is followed by [name].  He denied having any current chest pain but has mild cough with no hemoptysis.  He has no nausea, vomiting, diarrhea or constipation.  He has no headache or visual changes.  He had repeat CT scan of the chest performed recently and is here for evaluation and discussion of his scan results.  MEDICAL HISTORY: Past Medical History:  Diagnosis Date   Chronic back pain    "mid and lower" (04/07/2018)   Chronic rhinitis     -Sinus Ct 08/01/2009 >> Bilateral maxillary sinusitis with some mucosal thickeningin the sphenoid and frontal sinuses as well with air fluid levels present -chronic rhinitis flyer Aug 04, 2009   Compressed spine fracture [facility])    COPD (chronic obstructive pulmonary disease) (HCC)    PFT's rec Jul 17, 2009   Dyspnea    Emphysema lung (HCC)    On home oxygen therapy    "3L; 24/7" (04/07/2018)   Onychomycosis    Dr. Melvyn Novas   Orthostatic hypotension    "since 10/2017" (04/07/2018)   PAD (peripheral artery disease) (HCC)    Pneumonia    "twice in 1 year" (04/07/2018)   Pulmonary embolism (HCC) 04/07/2018   Skin cancer    "lips, face, ears, arms" (04/07/2018)   Small cell lung cancer, right (HCC) 2023   Dr. Mitzi Hansen.  rad   Vertigo    "since ~ 02/2018" (04/07/2018)    ALLERGIES:  is allergic to tape, augmentin [amoxicillin-pot clavulanate], cipro [ciprofloxacin hcl], gadavist [gadobutrol], levaquin [levofloxacin], and lipitor [atorvastatin].  MEDICATIONS:  Current Outpatient Medications  Medication Sig Dispense Refill   acetaminophen (TYLENOL) 500 MG tablet Take 500 mg by mouth every 4 (four) hours as needed for moderate pain, fever, headache or mild pain.     albuterol (PROVENTIL) (2.5 MG/3ML) 0.083% nebulizer solution Take 3 mLs (2.5 mg total) by nebulization every 6 (six) hours as needed for wheezing or shortness of breath. 75 mL 12   Calcium Carbonate-Vitamin D (CALCIUM 600+D PO) Take 2 tablets by mouth daily.     docusate sodium (  COLACE) 100 MG capsule Take 2 capsules (200 mg total) by mouth 2 (two) times daily. 10 capsule 0   ELIQUIS 2.5 MG TABS tablet TAKE 1 TABLET BY MOUTH TWICE  DAILY (Patient taking differently: Take 2.5 mg by mouth 2 (two) times daily.) 180 tablet 3   Ferrous Sulfate (IRON) 325 (65 Fe) MG TABS Take 325 mg by mouth every other day.     fluticasone (FLONASE) 50 MCG/ACT nasal spray USE 2 SPRAYS IN BOTH  NOSTRILS DAILY (Patient taking differently: Place 2 sprays into both  nostrils daily.) 48 g 3   folic acid (FOLVITE) 1 MG tablet Take 1 tablet (1 mg total) by mouth daily.     furosemide (LASIX) 20 MG tablet TAKE 1 TABLET BY MOUTH DAILY AS  NEEDED FOR FLUID OR EDEMA 90 tablet 3   Multiple Vitamin (MULTIVITAMIN WITH MINERALS) TABS tablet Take 1 tablet by mouth daily.     mupirocin cream (BACTROBAN) 2 % Apply 1 Application topically 2 (two) times daily. 30 g 0   oxyCODONE (OXY IR/ROXICODONE) 5 MG immediate release tablet Take 5 mg by mouth every 4 (four) hours as needed for severe pain.     OXYGEN Inhale 4-5 L into the lungs continuous.      pantoprazole (PROTONIX) 20 MG tablet TAKE 1 TABLET BY MOUTH  DAILY 90 tablet 3   Polyethyl Glycol-Propyl Glycol (SYSTANE) 0.4-0.3 % SOLN Place 1 drop into both eyes every other day.     predniSONE (DELTASONE) 50 MG tablet Take one tab po 13 hours prior, then one tab 7 hours prior, and then one tab 1 hour prior to CT. Also ask pt to take benadryl 50 mg po 1 hour prior to CT 3 tablet 0   roflumilast (DALIRESP) 500 MCG TABS tablet TAKE 1 TABLET BY MOUTH  DAILY (Patient taking differently: Take 500 mcg by mouth daily.) 90 tablet 3   rosuvastatin (CRESTOR) 20 MG tablet Take 1 tablet (20 mg total) by mouth daily. 90 tablet 3   SYMBICORT 160-4.5 MCG/ACT inhaler USE 2 INHALATIONS BY MOUTH TWICE DAILY 30.6 g 3   Tiotropium Bromide Monohydrate (SPIRIVA RESPIMAT) 2.5 MCG/ACT AERS USE 2 INHALATIONS BY MOUTH ONCE  DAILY 12 g 3   traMADol (ULTRAM) 50 MG tablet Take 50 mg by mouth every 12 (twelve) hours as needed for severe pain.     No current facility-administered medications for this visit.    SURGICAL HISTORY:  Past Surgical History:  Procedure Laterality Date   ABDOMINAL AORTOGRAM W/LOWER EXTREMITY N/A 01/21/2020   Procedure: ABDOMINAL AORTOGRAM W/LOWER EXTREMITY;  Surgeon: Sherren Kerns, MD;  Location: [facility] INVASIVE CV LAB;  Service: Cardiovascular;  Laterality: N/A;   APPENDECTOMY     BIOPSY  02/20/2020   Procedure: BIOPSY;   Surgeon: Meridee Score Netty Starring., MD;  Location: [facility] ENDOSCOPY;  Service: Gastroenterology;;   BIOPSY  08/12/2022   Procedure: BIOPSY;  Surgeon: Shellia Cleverly, DO;  Location: [facility] ENDOSCOPY;  Service: Gastroenterology;;   BRONCHIAL BIOPSY  09/04/2021   Procedure: BRONCHIAL BIOPSIES;  Surgeon: Josephine Igo, DO;  Location: [facility] ENDOSCOPY;  Service: Pulmonary;;   BRONCHIAL BRUSHINGS  09/04/2021   Procedure: BRONCHIAL BRUSHINGS;  Surgeon: Josephine Igo, DO;  Location: [facility] ENDOSCOPY;  Service: Pulmonary;;   BRONCHIAL NEEDLE ASPIRATION BIOPSY  09/04/2021   Procedure: BRONCHIAL NEEDLE ASPIRATION BIOPSIES;  Surgeon: Josephine Igo, DO;  Location: [facility] ENDOSCOPY;  Service: Pulmonary;;   CATARACT EXTRACTION, BILATERAL     ENDARTERECTOMY FEMORAL Right 04/19/2019  Procedure: ENDARTERECTOMY RIGHT COMMON FEMORAL;  Surgeon: Sherren Kerns, MD;  Location: [facility] OR;  Service: Vascular;  Laterality: Right;   ENDARTERECTOMY FEMORAL Left 01/24/2020   Procedure: LEFT FEMORAL ENDARTERECTOMY WITH DACRON PATCH ANGIOPLASTY;  Surgeon: Sherren Kerns, MD;  Location: [facility] OR;  Service: Vascular;  Laterality: Left;   ESOPHAGOGASTRODUODENOSCOPY (EGD) WITH PROPOFOL N/A 02/20/2020   Procedure: ESOPHAGOGASTRODUODENOSCOPY (EGD) WITH PROPOFOL;  Surgeon: Lemar Lofty., MD;  Location: [facility] ENDOSCOPY;  Service: Gastroenterology;  Laterality: N/A;   ESOPHAGOGASTRODUODENOSCOPY (EGD) WITH PROPOFOL N/A 08/12/2022   Procedure: ESOPHAGOGASTRODUODENOSCOPY (EGD) WITH PROPOFOL;  Surgeon: Shellia Cleverly, DO;  Location: [facility] ENDOS[facility];  Service: Gastroenterology;  Laterality: N/A;   FEMORAL ENDARTERECTOMY Left 01/24/2020   FIDUCIAL MARKER PLACEMENT  09/04/2021   Procedure: FIDUCIAL MARKER PLACEMENT;  Surgeon: Josephine Igo, DO;  Location: [facility] ENDOSCOPY;  Service: Pulmonary;;   HEMOSTASIS CLIP PLACEMENT  02/20/2020   Procedure: HEMOSTASIS CLIP PLACEMENT;  Surgeon: Lemar Lofty., MD;  Location: [facility] ENDOSCOPY;  Service:  Gastroenterology;;   HEMOSTASIS CLIP PLACEMENT  08/12/2022   Procedure: HEMOSTASIS CLIP PLACEMENT;  Surgeon: Shellia Cleverly, DO;  Location: [facility] ENDOS[facility];  Service: Gastroenterology;;   HOT HEMOSTASIS N/A 02/20/2020   Procedure: HOT HEMOSTASIS (ARGON PLASMA COAGULATION/BICAP);  Surgeon: Lemar Lofty., MD;  Location: [facility] ENDOSCOPY;  Service: Gastroenterology;  Laterality: N/A;   HOT HEMOSTASIS N/A 08/12/2022   Procedure: HOT HEMOSTASIS (ARGON PLASMA COAGULATION/BICAP);  Surgeon: Shellia Cleverly, DO;  Location: [facility] ENDOSCOPY;  Service: Gastroenterology;  Laterality: N/A;   INSERTION OF ILIAC STENT Left 01/24/2020   Procedure: INSERTION OF VBX STENT 8X59 AND 8X39 LEFT COMMON ILIAC ARTERY. INSERTION OF INNOVA 7 X 60 INNOVA STENT LEFT EXTERNAL ILIAC ARTERY. ;  Surgeon: Sherren Kerns, MD;  Location: [facility] OR;  Service: Vascular;  Laterality: Left;   LOWER EXTREMITY ANGIOGRAPHY  12/11/2018   LOWER EXTREMITY ANGIOGRAPHY N/A 12/11/2018   Procedure: LOWER EXTREMITY ANGIOGRAPHY;  Surgeon: Sherren Kerns, MD;  [facility];  Service: Cardiovascular;  Laterality: N/A;   PATCH ANGIOPLASTY Right 04/19/2019   Procedure: Patch Angioplasty;  Surgeon: Sherren Kerns, MD;  Location: [facility] OR;  Service: Vascular;  Laterality: Right;   PERIPHERAL VASCULAR INTERVENTION Right 12/11/2018   Procedure: PERIPHERAL VASCULAR INTERVENTION;  Surgeon: Sherren Kerns, MD;  [facility];  Service: Cardiovascular;  Laterality: Right;  Common Iliac    SKIN CANCER EXCISION     "lips, face, ears, arms" (04/07/2018)   SUBMUCOSAL INJECTION  08/12/2022   Procedure: SUBMUCOSAL INJECTION;  Surgeon: Shellia Cleverly, DO;  Location: [facility] ENDOSCOPY;  Service: Gastroenterology;;   TONSILLECTOMY     ULTRASOUND GUIDANCE FOR VASCULAR ACCESS Right 01/24/2020   Procedure: ULTRASOUND GUIDANCE FOR VASCULAR ACCESS;  Surgeon: Sherren Kerns, MD;  Location: [facility] OR;  Service: Vascular;  Laterality: Right;    VIDEO BRONCHOSCOPY WITH RADIAL ENDOBRONCHIAL ULTRASOUND  09/04/2021   Procedure: VIDEO BRONCHOSCOPY WITH RADIAL ENDOBRONCHIAL ULTRASOUND;  Surgeon: Josephine Igo, DO;  Location: [facility] ENDOSCOPY;  Service: Pulmonary;;    REVIEW OF SYSTEMS:  A comprehensive review of systems was negative except for: Constitutional: positive for fatigue Respiratory: positive for cough and dyspnea on exertion    LABORATORY DATA: Lab Results  Component Value Date   WBC 11.6 (H) 12/24/2022   HGB 11.1 (L) 12/24/2022   HCT 34.7 (L) 12/24/2022   MCV 98.9 12/24/2022   PLT 156 12/24/2022      Chemistry      Component Value  Date/Time   NA 142 12/24/2022 1006   NA 142 09/03/2022 1352   K 4.6 12/24/2022 1006   CL 98 12/24/2022 1006   CO2 38 (H) 12/24/2022 1006   BUN 13 12/24/2022 1006   BUN 13 09/03/2022 1352   CREATININE 0.94 12/24/2022 1006   CREATININE 1.14 07/06/2020 0941   GLU 90 03/02/2020 0000      Component Value Date/Time   CALCIUM 9.9 12/24/2022 1006   ALKPHOS 55 12/24/2022 1006   AST 23 12/24/2022 1006   ALT 15 12/24/2022 1006   BILITOT 0.5 12/24/2022 1006       RADIOGRAPHIC STUDIES: CT Chest W Contrast  Result Date: 12/26/2022 CLINICAL DATA:  Small cell lung cancer; * Tracking Code: BO * EXAM: CT CHEST WITH CONTRAST TECHNIQUE: Multidetector CT imaging of the chest was performed during intravenous contrast administration. RADIATION DOSE REDUCTION: This exam was performed according to the departmental dose-optimization program which includes automated exposure control, adjustment of the mA and/or kV according to patient size and/or use of iterative reconstruction technique. CONTRAST:  75mL OMNIPAQUE IOHEXOL 300 MG/ML  SOLN COMPARISON:  Chest CT dated June 21, 2022; abdominal MRI dated August 04, 2018 FINDINGS: Cardiovascular: Normal heart size. No pericardial effusion. Normal caliber thoracic aorta with severe atherosclerotic disease. Severe coronary artery calcifications.  Mediastinum/Nodes: Esophagus and thyroid are unremarkable. No enlarged lymph nodes seen in the chest. Lungs/Pleura: Central airways patent. Chronic scarring of the right lung apex, unchanged when compared with multiple priors. Severe centrilobular emphysema. Stable right perihilar postradiation change. No new or enlarging pulmonary nodules. No pleural effusion. Upper Abdomen: Cystic lesion of the pancreatic tail and cystic lesion of the upper pole of the right kidney, see MRI abdomen dated July 27, 2022 for follow-up recommendations. Stable right adrenal gland adenoma, no follow-up imaging is necessary. Musculoskeletal: Severe compression deformity of L2 demonstrates increased height loss when compared with August 09, 2022 lumbar spine CT, similar retropulsion. Previously seen moderate compression deformities of the thoracic spine are unchanged. No aggressive appearing osseous lesions. IMPRESSION: 1. Stable right perihilar postradiation change. No evidence of recurrent or metastatic disease in the chest. 2. Severe compression deformity of L2 demonstrates increased height loss when compared with August 09, 2022, similar mild retropulsion. 3. Cystic lesion of the pancreatic tail and cystic lesion of the upper pole of the right kidney, see MRI abdomen dated July 27, 2022 for follow-up recommendations. 4. Aortic Atherosclerosis (ICD10-I70.0) and Emphysema (ICD10-J43.9). Electronically Signed   By: Allegra Lai M.D.   On: 12/26/2022 08:39    ASSESSMENT AND PLAN:  This is a very pleasant 77 years old white male diagnosed with limited stage (T1 a, N0, M0) small cell lung cancer presented with 0.9 cm superior segment right lower lobe pulmonary nodule diagnosed in February 2023 status post SBRT to the right lower lobe lung nodule  The patient has been on observation and he is feeling fine with no concerning complaints except for the baseline shortness of breath secondary to COPD and he is currently on home  oxygen. He had repeat CT scan of the chest performed recently.  I personally and independently reviewed the scan and discussed results with the patient and his wife. His scan showed no findings concerning for recurrent or metastatic disease in the chest.  The patient continues to have cystic lesions of the pancreatic tail and cystic lesion of the upper pole of the right kidney.  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 I recommended for him to  continue on observation with repeat CT scan of the chest in 6 months. He will come back for follow-up visit at that time. He was advised to call immediately if he has any other concerning symptoms in the interval. I discussed the assessment and treatment plan with the patient. The patient was provided an opportunity to ask questions and all were answered. The patient agreed with the plan and demonstrated an understanding of the instructions.   The patient was advised to call back or seek an in-person evaluation if the symptoms worsen or if the condition fails to improve as anticipated.  I provided 20 minutes of face-to-face video visit time during this encounter, and > 50% was spent counseling as documented under my assessment & plan.  Lajuana Matte, MD 12/26/2022 3:24 PM  Disclaimer: This note was dictated with voice recognition software. Similar sounding words can inadvertently be transcribed and may not be corrected upon review.

## 2023-01-01 ENCOUNTER — Other Ambulatory Visit: Payer: Self-pay | Admitting: Family Medicine

## 2023-01-15 ENCOUNTER — Encounter: Payer: Self-pay | Admitting: Internal Medicine

## 2023-01-15 NOTE — Telephone Encounter (Signed)
Submitted Patient Assistance Application to [employer] for DUPIXENT along with provider portion, pt portion PA, med list, insurance card copy. Will update patient when we receive a response.  Phone #: [phone] Fax #: [phone]  Chesley Mires, PharmD, MPH, BCPS, CPP Clinical Pharmacist (Rheumatology and Pulmonology)

## 2023-01-22 ENCOUNTER — Ambulatory Visit (HOSPITAL_COMMUNITY): Admission: RE | Admit: 2023-01-22 | Payer: Medicare Other | Source: Ambulatory Visit

## 2023-01-23 MED ORDER — DUPIXENT 300 MG/2ML ~~LOC~~ SOAJ
SUBCUTANEOUS | 1 refills | Status: DC
Start: 2023-01-23 — End: 2023-02-03

## 2023-01-23 NOTE — Telephone Encounter (Signed)
Spoke with [employer] regarding PAP application. Was told that the only hold-up with the application was that they needed a new prescription to be sent in. Once they receive this rx, which Devki is sending in today, the application should be completed. Was told the patient will be approved, and it should be made official by Monday 7/22.  Updated patient/patient's wife. Scheduled new start visit for 8/5. Patient has a pressure sore currently which he would like to resolve before starting a new medication. Wife will call to reschedule appointment if it has not healed by 8/5.

## 2023-01-27 ENCOUNTER — Inpatient Hospital Stay: Payer: Medicare Other

## 2023-01-27 ENCOUNTER — Ambulatory Visit: Payer: Medicare Other | Admitting: Radiation Oncology

## 2023-01-27 NOTE — Telephone Encounter (Signed)
Received approval letter. Copy sent to patient's chart.

## 2023-01-29 NOTE — Progress Notes (Deleted)
HPI Patient presents today to [facility] to see pharmacy team for Dupixent new start.  Past medical history includes ***  Number of hospitalizations in past year: Number of ***COPD/asthma exacerbations in past year:   Respiratory Medications Current regimen: *** Tried in past: *** Patient reports {Adherence challenges yes no:[id number]::"adherence challenges","no known adherence challenges"}  OBJECTIVE Allergies  Allergen Reactions   Tape Other (See Comments)    Skin tears easily.   Augmentin [Amoxicillin-Pot Clavulanate] Other (See Comments)    Thrush Sore throat Hoarse voice   Cipro [Ciprofloxacin Hcl] Nausea And Vomiting    Weakness Fatigue    Gadavist [Gadobutrol] Other (See Comments)    Unknown reaction   Levaquin [Levofloxacin] Other (See Comments)    Hallucinations    Lipitor [Atorvastatin] Rash    Outpatient Encounter Medications as of 02/10/2023  Medication Sig   acetaminophen (TYLENOL) 500 MG tablet Take 500 mg by mouth every 4 (four) hours as needed for moderate pain, fever, headache or mild pain.   albuterol (PROVENTIL) (2.5 MG/3ML) 0.083% nebulizer solution Take 3 mLs (2.5 mg total) by nebulization every 6 (six) hours as needed for wheezing or shortness of breath.   Calcium Carbonate-Vitamin D (CALCIUM 600+D PO) Take 2 tablets by mouth daily.   docusate sodium (COLACE) 100 MG capsule Take 2 capsules (200 mg total) by mouth 2 (two) times daily.   Dupilumab (DUPIXENT) 300 MG/2ML SOPN Inject 600mg  SQ at Day 0 then 300mg  SQ every 14 days   ELIQUIS 2.5 MG TABS tablet TAKE 1 TABLET BY MOUTH TWICE  DAILY (Patient taking differently: Take 2.5 mg by mouth 2 (two) times daily.)   Ferrous Sulfate (IRON) 325 (65 Fe) MG TABS Take 325 mg by mouth every other day.   fluticasone (FLONASE) 50 MCG/ACT nasal spray USE 2 SPRAYS IN BOTH  NOSTRILS DAILY (Patient taking differently: Place 2 sprays into both nostrils daily.)   folic acid (FOLVITE) 1 MG tablet Take 1 tablet (1 mg  total) by mouth daily.   furosemide (LASIX) 20 MG tablet TAKE 1 TABLET BY MOUTH DAILY AS  NEEDED FOR FLUID OR EDEMA   Multiple Vitamin (MULTIVITAMIN WITH MINERALS) TABS tablet Take 1 tablet by mouth daily.   mupirocin cream (BACTROBAN) 2 % Apply 1 Application topically 2 (two) times daily.   oxyCODONE (OXY IR/ROXICODONE) 5 MG immediate release tablet Take 5 mg by mouth every 4 (four) hours as needed for severe pain.   OXYGEN Inhale 4-5 L into the lungs continuous.    pantoprazole (PROTONIX) 20 MG tablet TAKE 1 TABLET BY MOUTH DAILY   Polyethyl Glycol-Propyl Glycol (SYSTANE) 0.4-0.3 % SOLN Place 1 drop into both eyes every other day.   predniSONE (DELTASONE) 50 MG tablet Take one tab po 13 hours prior, then one tab 7 hours prior, and then one tab 1 hour prior to CT. Also ask pt to take benadryl 50 mg po 1 hour prior to CT   roflumilast (DALIRESP) 500 MCG TABS tablet TAKE 1 TABLET BY MOUTH  DAILY (Patient taking differently: Take 500 mcg by mouth daily.)   rosuvastatin (CRESTOR) 20 MG tablet Take 1 tablet (20 mg total) by mouth daily.   SYMBICORT 160-4.5 MCG/ACT inhaler USE 2 INHALATIONS BY MOUTH TWICE DAILY   Tiotropium Bromide Monohydrate (SPIRIVA RESPIMAT) 2.5 MCG/ACT AERS USE 2 INHALATIONS BY MOUTH ONCE  DAILY   traMADol (ULTRAM) 50 MG tablet Take 50 mg by mouth every 12 (twelve) hours as needed for severe pain.   No facility-administered encounter medications  on file as of 02/10/2023.     Immunization History  Administered Date(s) Administered   Fluad Quad(high Dose 65+) 03/16/2019, 03/10/2020   Influenza Split 04/24/2012   Influenza Whole 05/08/2009, 04/08/2011   Influenza, High Dose Seasonal PF 05/01/2017, 03/31/2018, 04/17/2021, 04/03/2022   Influenza,inj,Quad PF,6+ Mos 04/27/2013, 04/15/2014, 04/24/2015   Influenza,inj,quad, With Preservative 05/01/2018   Influenza-Unspecified 05/13/2016   PFIZER Comirnaty(Gray Top)Covid-19 Tri-Sucrose Vaccine 11/24/2020, 04/03/2022   PFIZER(Purple  Top)SARS-COV-2 Vaccination 07/30/2019, 08/20/2019, 04/07/2020   Pfizer Covid-19 Vaccine Bivalent Booster 20yrs & up 03/16/2021   Pneumococcal Conjugate-13 02/25/2014   Pneumococcal Polysaccharide-23 08/15/2011, 12/19/2016   Pneumococcal-Unspecified 03/08/2017   Respiratory Syncytial Virus Vaccine,Recomb Aduvanted(Arexvy) 06/06/2022   Td 02/21/2010   Tdap 11/05/2017   Zoster, Live 01/29/2012     PFTs     No data to display           Eosinophils Most recent blood eosinophil count was *** cells/microL taken on ***.   IgE: *** on ***   Assessment   Biologics training for dupilumab (Dupixent)  Goals of therapy: Mechanism: human monoclonal IgG4 antibody that inhibits interleukin-4 and interleukin-13 cytokine-induced responses, including release of proinflammatory cytokines, chemokines, and IgE Reviewed that Dupixent is add-on medication and patient must continue maintenance inhaler regimen. Response to therapy: may take 4 months to determine efficacy. Discussed that patients generally feel improvement sooner than 4 months.  Side effects: injection site reaction (6-18%), antibody development (5-16%), ophthalmic conjunctivitis (2-16%), transient blood eosinophilia (1-2%)  Dose: {dupixentdose:25708}  Administration/Storage:  Reviewed administration sites of thigh or abdomen (at least 2-3 inches away from abdomen). Reviewed the upper arm is only appropriate if caregiver is administering injection  Do not shake pen/syringe as this could lead to product foaming or precipitation. Do not use if solution is discolored or contains particulate matter or if window on prefilled pen is yellow (indicates pen has been used).  Reviewed storage of medication in refrigerator. Reviewed that Dupixent can be stored at room temperature in unopened carton for up to 14 days.  Access: Approval of Dupixent through: patient assistance  Patient self-administered Dupixent 300mg /59ml x 2 (total dose 600mg )  in {injsitedsg:28167} and {injsitedsg:28167} using sample Dupixent 300mg /14mL autoinjector pen NDC: *** Lot: *** Expiration: ***  Patient monitored for 30 minutes for adverse reaction.  Patient tolerated ***. Injection site checked and no ***.  Medication Reconciliation  A drug regimen assessment was performed, including review of allergies, interactions, disease-state management, dosing and immunization history. Medications were reviewed with the patient, including name, instructions, indication, goals of therapy, potential side effects, importance of adherence, and safe use.  Drug interaction(s): ***  Immunizations  Patient is indicated for the influenzae, pneumonia, and shingles vaccinations. Patient has received *** COVID19 vaccines.   PLAN Continue Dupixent 300mg  every 14 days.  Next dose is due *** and every 14 days thereafter. Rx sent to: [facility]: [phone].  Patient provided with pharmacy phone number and advised to call later this week to schedule shipment to home.  Continue maintenance asthma regimen of: ***  All questions encouraged and answered.  Instructed patient to reach out with any further questions or concerns.  Thank you for allowing pharmacy to participate in this patient's care.  This appointment required *** minutes of patient care (this includes precharting, chart review, review of results, face-to-face care, etc.).

## 2023-01-30 ENCOUNTER — Other Ambulatory Visit: Payer: Medicare Other | Admitting: Pharmacist

## 2023-01-31 ENCOUNTER — Telehealth: Payer: Self-pay | Admitting: Internal Medicine

## 2023-01-31 ENCOUNTER — Encounter: Payer: Self-pay | Admitting: Internal Medicine

## 2023-01-31 NOTE — Telephone Encounter (Signed)
Patient would like to discuss Dupixent injection. Patient phone number is [phone].

## 2023-02-03 ENCOUNTER — Ambulatory Visit (HOSPITAL_COMMUNITY)
Admission: RE | Admit: 2023-02-03 | Discharge: 2023-02-03 | Disposition: A | Discharge status: Home / Self Care | Payer: Medicare Other | Source: Ambulatory Visit | Attending: Radiation Oncology | Admitting: Radiation Oncology

## 2023-02-03 DIAGNOSIS — C3490 Malignant neoplasm of unspecified part of unspecified bronchus or lung: Secondary | ICD-10-CM | POA: Insufficient documentation

## 2023-02-03 MED ORDER — GADOBUTROL 1 MMOL/ML IV SOLN
7.5000 mL | Freq: Once | INTRAVENOUS | Status: AC | PRN
  Administered 2023-02-03: 7.5 mL via INTRAVENOUS

## 2023-02-03 NOTE — Telephone Encounter (Signed)
Returned call to patient regarding Dupixent injections. He states he received medication at home and is no longer interested in taking the medication. He and his wife read the information that was sent with the Dupixent and read about side effects including trouble breathing, rash, body aches, cold-like symptoms, and eye problems. I reviewed that many of these side effects are unlikely and have to be written so that patient knows when to seek emergent care (like anaphylactic reaction symptoms).  He states he is not interested in starting this at all. I advised he discard medication in sharps box or empty laundry detergent container to safely dispose of needles.  He was scheduled for new start on 02/10/2023. This has been cancelled  Routing to [name] as Katheren Shams, PharmD, MPH, BCPS, CPP Clinical Pharmacist (Rheumatology and Pulmonology)

## 2023-02-04 NOTE — Telephone Encounter (Signed)
Patient has spoken to someone in regards to dupixent and the message has been routed to Dr Ardyth Man.

## 2023-02-05 ENCOUNTER — Ambulatory Visit (INDEPENDENT_AMBULATORY_CARE_PROVIDER_SITE_OTHER): Payer: Medicare Other | Admitting: Internal Medicine

## 2023-02-05 ENCOUNTER — Encounter: Payer: Self-pay | Admitting: Internal Medicine

## 2023-02-05 VITALS — BP 126/72 | HR 114 | Temp 98.9°F | Ht 72.0 in

## 2023-02-05 DIAGNOSIS — L89322 Pressure ulcer of left buttock, stage 2: Secondary | ICD-10-CM | POA: Diagnosis not present

## 2023-02-05 DIAGNOSIS — J449 Chronic obstructive pulmonary disease, unspecified: Secondary | ICD-10-CM | POA: Diagnosis not present

## 2023-02-05 DIAGNOSIS — L239 Allergic contact dermatitis, unspecified cause: Secondary | ICD-10-CM

## 2023-02-05 MED ORDER — TRIPLE ANTIBIOTIC 3.5-400-5000 EX OINT
1.0000 | TOPICAL_OINTMENT | Freq: Four times a day (QID) | CUTANEOUS | 0 refills | Status: DC | PRN
Start: 2023-02-05 — End: 2023-02-20

## 2023-02-05 MED ORDER — HYDROXYZINE PAMOATE 25 MG PO CAPS
25.0000 mg | ORAL_CAPSULE | Freq: Three times a day (TID) | ORAL | 0 refills | Status: DC | PRN
Start: 2023-02-05 — End: 2023-03-03

## 2023-02-05 NOTE — Progress Notes (Signed)
[facility]: [phone]   Routine Medical Office Visit  Patient:  Francisco [name]      Age: 77 y.o.       Sex:  male  Date:   02/05/2023 Patient Care Team: Shelva Majestic, MD as PCP - General (Family Medicine) Kalman Shan, MD as Consulting Physician (Pulmonary Disease) Arminda Resides, MD as Consulting Physician (Dermatology) Dr. Mickie Hillier, DDS (Dentistry) Today's Healthcare Provider: Lula Olszewski, MD   Assessment and Plan:    Assessment & Plan Pressure injury of left buttock, stage 2 (HCC) ### Assessment: - Two-week history of left buttock pressure ulcer - Attributed to prolonged sitting due to advanced COPD - Stage 2 ulcer with significant area at stage 1 - No signs of infection currently  ### Plan: - Initiate home wound care - Apply Triple Antibiotic ointment:   - 1 application topically 4 times daily as needed   - Dispense: 28 g; Refill: 0 - Order pressure redistribution chair pad - Consider acquiring a pressure redistribution mattress - Ambulatory referral to Home Health  ### Patient Education: - Explained the stages of pressure ulcers and the importance of prevention - Discussed proper wound care technique and application of antibiotic ointment - Emphasized the need for frequent position changes (at least every 2 hours) - Included in AVS  proper nutrition for wound healing (high protein, vitamin C, zinc) - Discussed signs of infection to watch for (increased pain, redness, swelling, fever) Chronic obstructive pulmonary disease, unspecified COPD type (HCC) ### Assessment: - Advanced stage COPD - Limiting mobility, contributing to pressure ulcer development  ### Plan: - Ambulatory referral to Home Health - AMB referral to pulmonary rehabilitation - Provide spacer for inhaler use  ### Patient Education: - Review proper inhaler technique with new spacer at next visit - Explain benefits of pulmonary rehabilitation  (improved breathing, increased stamina) - Emphasized importance of regular exercise within limits of ability Allergic dermatitis ### Assessment: - Rash likely due to allergy to MRI contrast dye  ### Plan: - Prescribe hydrOXYzine Pamoate:   - 1 capsule (25 mg) by mouth every 8 hours as needed   - Dispense: 30 capsules; Refill: 0  ### Patient Education: - Explained the nature of allergic reactions and potential triggers - Discussed proper use of hydroxyzine  - Placed allergy on chart to inform all healthcare providers about this allergy for future procedures  ## General Health Maintenance: - Schedule next visit to check inhaler technique with the new spacer - Emphasized the interconnected nature of these conditions and the importance of addressing all aspects of health - Encouraged questions and open communication about any new or worsening symptoms End stage COPD (HCC) Will share this situation with Dr. Durene Cal his Primary Care Provider (PCP) for further discussion.     Orders          Ordered    neomycin-bacitracin-polymyxin [phone] OINT  4 times daily PRN        02/05/23 1108    Ambulatory referral to Home Health       Comments: Please evaluate Francisco [name] for admission to [facility].  Disciplines requested: Nursing and Respiratory Therapy  Services to provide: [facility] Care and Strengthening Exercises  Physician to follow patient's care (the person listed here will be responsible for signing ongoing orders): Referring Provider  Requested Start of Care Date: Tomorrow  I certify that this patient is under my care and that I, or a Nurse Practitioner or Physician's Assistant  working with me, had a face-to-face encounter that meets the physician face-to-face requirements with patient on 02/05/23. The encounter with the patient was in whole, or in part for the following medical condition(s) which is the primary reason for home health care (List medical condition).  Pressure ulcer from wheelchair, not walking from shortness of breath   02/05/23 1108    AMB referral to pulmonary rehabilitation        02/05/23 1108    hydrOXYzine (VISTARIL) 25 MG capsule  Every 8 hours PRN        02/05/23 1108    pressure redistribution chair pad        02/05/23 1108             General Health Maintenance: During his next visit, we will check his inhaler technique with the new spacer.  Recommended follow up: Return in about 1 week (around 02/12/2023).  Future Appointments  Date Time Provider Department Center  02/10/2023  7:00 AM [facility] BOARD CONFERENCE [facility] None  02/11/2023  8:30 AM Ronny Bacon, PA-C [facility] None  03/14/2023  2:45 PM Kalman Shan, MD [facility] None  04/17/2023  9:40 AM Shellia Cleverly, DO [facility]  06/19/2023  9:00 AM [facility] LAB [facility] None  06/23/2023  1:45 PM Si Gaul, MD [facility] None  08/21/2023 11:00 AM Shelva Majestic, MD [facility]           Clinical Presentation:    77 y.o. male who has Former smoker; History of colonic polyps; Pulmonary nodule; Chronic rhinitis; History of skin cancer; Hyperlipidemia; Onychomycosis; Left ankle swelling; Hyperglycemia; Leukocytosis; BPH associated with nocturia; Chronic respiratory failure with hypoxia (HCC); Thoracic compression fracture (HCC); Essential hypertension; Venous stasis dermatitis of both lower extremities; Osteoporosis; BPPV (benign paroxysmal positional vertigo), right; Essential tremor; Chronic pulmonary embolism (HCC); DNR (do not resuscitate); Diastolic dysfunction; Stage 4 very severe COPD by GOLD classification (HCC); Pre-operative respiratory examination; PAD (peripheral artery disease) (HCC); Acute GI bleeding; Malignant neoplasm of lower lobe of right lung (HCC); H/O arteriovenous malformation (AVM); History of upper GI bleeding; Hiatal hernia; Chronic anticoagulation; Anemia; Iron deficiency anemia due to chronic blood  loss; AVM (arteriovenous malformation) of small bowel, acquired; Chronic systolic congestive heart failure (HCC); Melena; Dieulafoy lesion of stomach; Gastritis and gastroduodenitis; AVM (arteriovenous malformation) of duodenum, acquired; and Lumbar compression fracture (HCC) on their problem list. His reasons/main concerns/chief complaints for today's office visit are Pain on area of buttocks   AI-Extracted: Discussed the use of AI scribe software for clinical note transcription with the patient, who gave verbal consent to proceed.  History of Present Illness   The patient, with a history of advanced COPD and frequent hospitalizations, presents with a two-week-old pressure ulcer on the left buttock. The patient has been largely immobile due to the severity of his COPD. The patient has previously received home physical therapy, but it has since been discontinued. The patient also reports a rash on his back, which is suspected to be an allergic reaction to MRI contrast dye.  The patient has been managing the wound at home, keeping it clean, and it does not appear to be infected at this time. The patient also reports pain in the surrounding tissue, which is suspected to be due to lack of oxygen from prolonged pressure.  The patient has been using a lift chair for sleeping due to difficulty getting into bed. He has also been using boots designed to prevent blood clots by promoting blood flow in the legs.  The patient has been experiencing changes in body temperature and has been coughing up brownish sputum, which may be indicative of progression of his COPD.  The patient has previously undergone pulmonary rehab and has been using inhalers for his COPD. However, it is noted that the patient may not be getting the full benefit of the inhalers due to lack of a spacer device.      He  has a past medical history of Chronic back pain, Chronic rhinitis, Compressed spine fracture (HCC), COPD (chronic obstructive  pulmonary disease) (HCC), Dyspnea, Emphysema lung (HCC), On home oxygen therapy, Onychomycosis, Orthostatic hypotension, PAD (peripheral artery disease) (HCC), Pneumonia, Pulmonary embolism (HCC) (04/07/2018), Skin cancer, Small cell lung cancer, right (HCC) (2023), and Vertigo. Current Outpatient Medications on File Prior to Visit  Medication Sig   acetaminophen (TYLENOL) 500 MG tablet Take 500 mg by mouth every 4 (four) hours as needed for moderate pain, fever, headache or mild pain.   albuterol (PROVENTIL) (2.5 MG/3ML) 0.083% nebulizer solution Take 3 mLs (2.5 mg total) by nebulization every 6 (six) hours as needed for wheezing or shortness of breath.   azithromycin (ZITHROMAX) 250 MG tablet Take by mouth.   Calcium Carbonate-Vitamin D (CALCIUM 600+D PO) Take 2 tablets by mouth daily.   docusate sodium (COLACE) 100 MG capsule Take 2 capsules (200 mg total) by mouth 2 (two) times daily.   ELIQUIS 2.5 MG TABS tablet TAKE 1 TABLET BY MOUTH TWICE  DAILY (Patient taking differently: Take 2.5 mg by mouth 2 (two) times daily.)   Ferrous Sulfate (IRON) 325 (65 Fe) MG TABS Take 325 mg by mouth every other day.   fluticasone (FLONASE) 50 MCG/ACT nasal spray USE 2 SPRAYS IN BOTH  NOSTRILS DAILY (Patient taking differently: Place 2 sprays into both nostrils daily.)   folic acid (FOLVITE) 1 MG tablet Take 1 tablet (1 mg total) by mouth daily.   furosemide (LASIX) 20 MG tablet TAKE 1 TABLET BY MOUTH DAILY AS  NEEDED FOR FLUID OR EDEMA   Multiple Vitamin (MULTIVITAMIN WITH MINERALS) TABS tablet Take 1 tablet by mouth daily.   mupirocin cream (BACTROBAN) 2 % Apply 1 Application topically 2 (two) times daily.   oxyCODONE (OXY IR/ROXICODONE) 5 MG immediate release tablet Take 5 mg by mouth every 4 (four) hours as needed for severe pain.   OXYGEN Inhale 4-5 L into the lungs continuous.    pantoprazole (PROTONIX) 20 MG tablet TAKE 1 TABLET BY MOUTH DAILY   Polyethyl Glycol-Propyl Glycol (SYSTANE) 0.4-0.3 % SOLN  Place 1 drop into both eyes every other day.   predniSONE (DELTASONE) 50 MG tablet Take one tab po 13 hours prior, then one tab 7 hours prior, and then one tab 1 hour prior to CT. Also ask pt to take benadryl 50 mg po 1 hour prior to CT   roflumilast (DALIRESP) 500 MCG TABS tablet TAKE 1 TABLET BY MOUTH  DAILY (Patient taking differently: Take 500 mcg by mouth daily.)   rosuvastatin (CRESTOR) 20 MG tablet Take 1 tablet (20 mg total) by mouth daily.   SYMBICORT 160-4.5 MCG/ACT inhaler USE 2 INHALATIONS BY MOUTH TWICE DAILY   Tiotropium Bromide Monohydrate (SPIRIVA RESPIMAT) 2.5 MCG/ACT AERS USE 2 INHALATIONS BY MOUTH ONCE  DAILY   traMADol (ULTRAM) 50 MG tablet Take 50 mg by mouth every 12 (twelve) hours as needed for severe pain.   No current facility-administered medications on file prior to visit.   There are no discontinued medications.  Clinical Data Analysis:   Physical Exam  BP 126/72 (BP Location: Left Arm, Patient Position: Sitting)   Pulse (!) 114   Temp 98.9 F (37.2 C) (Temporal)   Ht 6' (1.829 m)   SpO2 (!) 82%   BMI 22.38 kg/m  Wt Readings from Last 10 Encounters:  11/06/22 165 lb (74.8 kg)  10/08/22 166 lb (75.3 kg)  09/11/22 168 lb (76.2 kg)  09/03/22 166 lb (75.3 kg)  08/17/22 194 lb 14.2 oz (88.4 kg)  06/06/22 182 lb (82.6 kg)  05/02/22 178 lb (80.7 kg)  03/07/22 179 lb 3.2 oz (81.3 kg)  01/29/22 174 lb (78.9 kg)  12/10/21 174 lb (78.9 kg)   Vital signs reviewed.  Nursing notes reviewed. Weight trend reviewed. Abnormalities and Problem-Specific physical exam findings:  pursed lip breathing on oxygen in wheelchair, barely able to get into position to show ulcer on his own, causes significant exertional strain just to stand. Atrophy of quads and legs Photographs Taken 02/05/2023 :     Itchy back rash- attributed to starting after recent MRI contrast  General Appearance:  No acute distress appreciable.   Well-groomed, healthy-appearing male.  Well  proportioned with no abnormal fat distribution.  Good muscle tone. Skin: Clear and well-hydrated. Pulmonary:  Normal work of breathing at rest, no respiratory distress apparent. SpO2: (!) 82 %  Musculoskeletal: All extremities are intact.  Neurological:  Awake, alert, oriented, and engaged.  No obvious focal neurological deficits or cognitive impairments.  Sensorium seems unclouded.   Speech is clear and coherent with logical content. Psychiatric:  Appropriate mood, pleasant and cooperative demeanor, thoughtful and engaged during the exam  Results Reviewed:      No results found for any visits on 02/05/23.  Appointment on 12/24/2022  Component Date Value   Sodium 12/24/2022 142    Potassium 12/24/2022 4.6    Chloride 12/24/2022 98    CO2 12/24/2022 38 (H)    Glucose, Bld 12/24/2022 122 (H)    BUN 12/24/2022 13    Creatinine 12/24/2022 0.94    Calcium 12/24/2022 9.9    Total Protein 12/24/2022 6.1 (L)    Albumin 12/24/2022 3.7    AST 12/24/2022 23    ALT 12/24/2022 15    Alkaline Phosphatase 12/24/2022 55    Total Bilirubin 12/24/2022 0.5    GFR, Estimated 12/24/2022 >60    Anion gap 12/24/2022 6    WBC Count 12/24/2022 11.6 (H)    RBC 12/24/2022 3.51 (L)    Hemoglobin 12/24/2022 11.1 (L)    HCT 12/24/2022 34.7 (L)    MCV 12/24/2022 98.9    MCH 12/24/2022 31.6    MCHC 12/24/2022 32.0    RDW 12/24/2022 13.8    Platelet Count 12/24/2022 156    nRBC 12/24/2022 0.0    Neutrophils Relative % 12/24/2022 87    Neutro Abs 12/24/2022 10.2 (H)    Lymphocytes Relative 12/24/2022 5    Lymphs Abs 12/24/2022 0.6 (L)    Monocytes Relative 12/24/2022 5    Monocytes Absolute 12/24/2022 0.6    Eosinophils Relative 12/24/2022 2    Eosinophils Absolute 12/24/2022 0.2    Basophils Relative 12/24/2022 0    Basophils Absolute 12/24/2022 0.0    Immature Granulocytes 12/24/2022 1    Abs Immature Granulocytes 12/24/2022 0.08 (H)   Hospital Outpatient Visit on 10/18/2022  Component Date  Value   Right ABI 10/18/2022 0.79    Left ABI 10/18/2022 0.95   Office Visit on 09/03/2022  Component Date Value  Glucose 09/03/2022 101 (H)    BUN 09/03/2022 13    Creatinine, Ser 09/03/2022 0.97    eGFR 09/03/2022 82    BUN/Creatinine Ratio 09/03/2022 13    Sodium 09/03/2022 142    Potassium 09/03/2022 4.7    Chloride 09/03/2022 98    CO2 09/03/2022 29    Calcium 09/03/2022 9.6    Total Protein 09/03/2022 6.2    Albumin 09/03/2022 4.0    Globulin, Total 09/03/2022 2.2    Albumin/Globulin Ratio 09/03/2022 1.8    Bilirubin Total 09/03/2022 0.3    Alkaline Phosphatase 09/03/2022 156 (H)    AST 09/03/2022 26    ALT 09/03/2022 25    WBC 09/03/2022 9.7    RBC 09/03/2022 3.69 (L)    Hemoglobin 09/03/2022 11.3 (L)    Hematocrit 09/03/2022 33.7 (L)    MCV 09/03/2022 91    MCH 09/03/2022 30.6    MCHC 09/03/2022 33.5    RDW 09/03/2022 12.5    Platelets 09/03/2022 225   No results displayed because visit has over 200 results.    Appointment on 06/21/2022  Component Date Value   WBC Count 06/21/2022 10.3    RBC 06/21/2022 3.69 (L)    Hemoglobin 06/21/2022 12.0 (L)    HCT 06/21/2022 36.7 (L)    MCV 06/21/2022 99.5    MCH 06/21/2022 32.5    MCHC 06/21/2022 32.7    RDW 06/21/2022 13.2    Platelet Count 06/21/2022 178    nRBC 06/21/2022 0.0    Neutrophils Relative % 06/21/2022 88    Neutro Abs 06/21/2022 9.1 (H)    Lymphocytes Relative 06/21/2022 5    Lymphs Abs 06/21/2022 0.5 (L)    Monocytes Relative 06/21/2022 5    Monocytes Absolute 06/21/2022 0.5    Eosinophils Relative 06/21/2022 1    Eosinophils Absolute 06/21/2022 0.1    Basophils Relative 06/21/2022 0    Basophils Absolute 06/21/2022 0.0    Immature Granulocytes 06/21/2022 1    Abs Immature Granulocytes 06/21/2022 0.08 (H)    Sodium 06/21/2022 142    Potassium 06/21/2022 5.2 (H)    Chloride 06/21/2022 101    CO2 06/21/2022 34 (H)    Glucose, Bld 06/21/2022 114 (H)    BUN 06/21/2022 14    Creatinine  06/21/2022 1.06    Calcium 06/21/2022 9.9    Total Protein 06/21/2022 6.9    Albumin 06/21/2022 4.1    AST 06/21/2022 23    ALT 06/21/2022 19    Alkaline Phosphatase 06/21/2022 73    Total Bilirubin 06/21/2022 0.5    GFR, Estimated 06/21/2022 >60    Anion gap 06/21/2022 7   Office Visit on 06/06/2022  Component Date Value   WBC 06/06/2022 11.0 (H)    RBC 06/06/2022 3.78 (L)    Hemoglobin 06/06/2022 12.2 (L)    HCT 06/06/2022 36.4 (L)    MCV 06/06/2022 96.3    MCHC 06/06/2022 33.5    RDW 06/06/2022 13.1    Platelets 06/06/2022 251.0    Neutrophils Relative % 06/06/2022 89.1 Repeated and verified X2. (H)    Lymphocytes Relative 06/06/2022 4.3 (L)    Monocytes Relative 06/06/2022 5.3    Eosinophils Relative 06/06/2022 0.9    Basophils Relative 06/06/2022 0.4    Neutro Abs 06/06/2022 9.8 (H)    Lymphs Abs 06/06/2022 0.5 (L)    Monocytes Absolute 06/06/2022 0.6    Eosinophils Absolute 06/06/2022 0.1    Basophils Absolute 06/06/2022 0.0   Lab on 03/14/2022  Component Date Value  IgE (Immunoglobulin E), * 03/14/2022 5    WBC 03/14/2022 11.4 (H)    RBC 03/14/2022 3.64 (L)    Hemoglobin 03/14/2022 11.9 (L)    HCT 03/14/2022 35.6 (L)    MCV 03/14/2022 97.8    MCHC 03/14/2022 33.5    RDW 03/14/2022 14.3    Platelets 03/14/2022 219.0    Neutrophils Relative % 03/14/2022 83.0 (H)    Lymphocytes Relative 03/14/2022 6.6 Repeated and verified X2. (L)    Monocytes Relative 03/14/2022 9.2    Eosinophils Relative 03/14/2022 0.9    Basophils Relative 03/14/2022 0.3    Neutro Abs 03/14/2022 9.5 (H)    Lymphs Abs 03/14/2022 0.8    Monocytes Absolute 03/14/2022 1.0    Eosinophils Absolute 03/14/2022 0.1    Basophils Absolute 03/14/2022 0.0    No image results found.   CT Chest W Contrast  Result Date: 12/26/2022 CLINICAL DATA:  Small cell lung cancer; * Tracking Code: BO * EXAM: CT CHEST WITH CONTRAST TECHNIQUE: Multidetector CT imaging of the chest was performed during intravenous  contrast administration. RADIATION DOSE REDUCTION: This exam was performed according to the departmental dose-optimization program which includes automated exposure control, adjustment of the mA and/or kV according to patient size and/or use of iterative reconstruction technique. CONTRAST:  75mL OMNIPAQUE IOHEXOL 300 MG/ML  SOLN COMPARISON:  Chest CT dated June 21, 2022; abdominal MRI dated August 04, 2018 FINDINGS: Cardiovascular: Normal heart size. No pericardial effusion. Normal caliber thoracic aorta with severe atherosclerotic disease. Severe coronary artery calcifications. Mediastinum/Nodes: Esophagus and thyroid are unremarkable. No enlarged lymph nodes seen in the chest. Lungs/Pleura: Central airways patent. Chronic scarring of the right lung apex, unchanged when compared with multiple priors. Severe centrilobular emphysema. Stable right perihilar postradiation change. No new or enlarging pulmonary nodules. No pleural effusion. Upper Abdomen: Cystic lesion of the pancreatic tail and cystic lesion of the upper pole of the right kidney, see MRI abdomen dated July 27, 2022 for follow-up recommendations. Stable right adrenal gland adenoma, no follow-up imaging is necessary. Musculoskeletal: Severe compression deformity of L2 demonstrates increased height loss when compared with August 09, 2022 lumbar spine CT, similar retropulsion. Previously seen moderate compression deformities of the thoracic spine are unchanged. No aggressive appearing osseous lesions. IMPRESSION: 1. Stable right perihilar postradiation change. No evidence of recurrent or metastatic disease in the chest. 2. Severe compression deformity of L2 demonstrates increased height loss when compared with August 09, 2022, similar mild retropulsion. 3. Cystic lesion of the pancreatic tail and cystic lesion of the upper pole of the right kidney, see MRI abdomen dated July 27, 2022 for follow-up recommendations. 4. Aortic Atherosclerosis  (ICD10-I70.0) and Emphysema (ICD10-J43.9). Electronically Signed   By: Allegra Lai M.D.   On: 12/26/2022 08:39    No results found.    This encounter employed real-time, collaborative documentation. The patient actively reviewed and updated their medical record on a shared screen, ensuring transparency and facilitating joint problem-solving for the problem list, overview, and plan. This approach promotes accurate, informed care. The treatment plan was discussed and reviewed in detail, including medication safety, potential side effects, and all patient questions. We confirmed understanding and comfort with the plan. Follow-up instructions were established, including contacting the office for any concerns, returning if symptoms worsen, persist, or new symptoms develop, and precautions for potential emergency department visits. ----------------------------------------------------- Lula Olszewski, MD  02/05/2023 7:00 PM  [facility]:  [phone]

## 2023-02-05 NOTE — Patient Instructions (Addendum)
It was a pleasure seeing you today! Your health and satisfaction are our top priorities.   Glenetta Hew, MD VISIT SUMMARY:  During your visit, we discussed your advanced COPD, a pressure ulcer on your left buttock, and a rash on your back. Your COPD has limited your mobility, which has contributed to the development of the pressure ulcer. The rash on your back is likely due to an allergy to MRI contrast dye.  YOUR PLAN:  -PRESSURE ULCER: You have a pressure ulcer on your left buttock, which is a sore that has developed due to prolonged sitting. We will start home wound care, apply antibiotic ointment to prevent infection, and order a pressure redistribution chair pad to help relieve pressure and promote healing. Maintain proper nutrition for wound healing (high protein, vitamin C, zinc)   -COPD: Your COPD, a lung disease that makes it hard to breathe, is at an advanced stage. We will refer you to pulmonary rehab to improve your quality of life and potentially enhance mobility. We will also provide a spacer for your inhaler to increase the delivery of medication to your lungs.  -ALLERGIC REACTION: You have a rash on your back, likely due to an allergy to MRI contrast dye. We will prescribe hydroxyzine to help relieve the itch.  INSTRUCTIONS:  To further prevent pressure ulcers, we are considering acquiring a pressure redistribution mattress for you. During your next visit, we will check your technique with the new inhaler spacer. Next Steps:  [x]  Flexible Follow-Up: We recommend a follow up with your primary care, a specialist, or me within 1-2 weeks for ensuring your problem resolves. This allows for progress monitoring and treatment adjustments. [x]  Early Intervention: Schedule sooner appointment, call our on-call services, or go to emergency room if there is Increase in pain or discomfort New or worsening symptoms Sudden or severe changes in your health [x]  Lab & X-ray Appointments:  complete or schedule to complete today, or call to schedule.  X-rays: [facility] (M-F, 8:30am-noon or 1pm-5pm).  Making the Most of Our Focused (20 minute) Follow Up Appointments:  [x]   Clearly state your top concerns at the beginning of the visit to focus our discussion [x]   If you anticipate you will need more time, please inform the front desk during scheduling - we can book multiple appointments in the same week. [x]   If you have transportation problems- use our convenient video appointments or ask about transportation support. [x]   We can get down to business faster if you use MyChart to update information before the visit and submit non-urgent questions before your visit. Thank you for taking the time to provide details through MyChart.  Let our nurse know and she can import this information into your encounter documents.  Arrival and Wait Times: [x]   Arriving on time ensures that everyone receives prompt attention. [x]   Early morning (8a) and afternoon (1p) appointments tend to have shortest wait times. [x]   Unfortunately, we cannot delay appointments for late arrivals or hold slots during phone calls.  Getting Answers and Following Up  [x]   Simple Questions & Concerns: For quick questions or basic follow-up after your visit, reach [place] at [phone] or MyChart messaging. [x]   Complex Concerns: If your concern is more complex, scheduling an appointment might be best. Discuss this with the staff to find the most suitable option. [x]   Lab & Imaging Results: We'll contact you directly if results are abnormal or you don't use MyChart. Most normal results  will be on MyChart within 2-3 business days, with a review message from Dr. Jon Billings. Haven't heard back in 2 weeks? Need results sooner? Contact us at [phone]. [x]   Referrals: Our referral coordinator will manage specialist referrals. The specialist's office should contact you within 2 weeks to schedule an  appointment. Call us if you haven't heard from them after 2 weeks.  Staying Connected  [x]   MyChart: Activate your MyChart for the fastest way to access results and message [place]. See the last page of this paperwork for instructions on how to activate.  Bring to Your Next Appointment  [x]   Medications: Please bring all your medication bottles to your next appointment to ensure we have an accurate record of your prescriptions. [x]   Health Diaries: If you're monitoring any health conditions at home, keeping a diary of your readings can be very helpful for discussions at your next appointment.  Billing  [x]   X-ray & Lab Orders: These are billed by separate companies. Contact the invoicing company directly for questions or concerns. [x]   Visit Charges: Discuss any billing inquiries with our administrative services team.  Your Satisfaction Matters  [x]   Share Your Experience: We strive for your satisfaction! If you have any complaints, or preferably compliments, please let Dr. Jon Billings know directly or contact our Practice Administrators, Edwena Felty or [employer], by asking at the front desk.   Reviewing Your Records  [x]   Review this early draft of your clinical encounter notes below and the final encounter summary tomorrow on MyChart after its been completed.   Pressure injury of left buttock, stage 2 (HCC) -     Triple Antibiotic; Apply 1 Application topically 4 (four) times daily as needed.  Dispense: 28 g; Refill: 0 -     Ambulatory referral to Home Health -     pressure redistribution chair pad  Chronic obstructive pulmonary disease, unspecified COPD type (HCC) -     Ambulatory referral to Home Health -     AMB referral to pulmonary rehabilitation  Allergic dermatitis -     hydrOXYzine Pamoate; Take 1 capsule (25 mg total) by mouth every 8 (eight) hours as needed.  Dispense: 30 capsule; Refill: 0

## 2023-02-07 NOTE — Progress Notes (Addendum)
Spoke with patients wife -he is not physical able to make it to pulmonary rehab- they will discuss this option with [name] in the future but we cancelled that referral - they feel confident if they can get home health in getting area cleaned- want to hold off on visit next week.  - I messaged team to get home health set up- they would like to get things moving right direction

## 2023-02-07 NOTE — Addendum Note (Signed)
Addended by: Shelva Majestic on: 02/07/2023 09:36 AM   Modules accepted: Orders

## 2023-02-10 ENCOUNTER — Telehealth: Payer: Self-pay | Admitting: *Deleted

## 2023-02-10 ENCOUNTER — Inpatient Hospital Stay: Payer: Medicare Other

## 2023-02-10 ENCOUNTER — Other Ambulatory Visit: Payer: Medicare Other | Admitting: Pharmacist

## 2023-02-10 NOTE — Telephone Encounter (Signed)
CALLED PATIENT TO ASK ABOUT RECEIVING PHONE CALL FOR MRI RESULTS ON 02-18-23 @ 8:30 AM, DUE TO RESULTS NOT BEING READY, SPOKE WITH PATIENT AND HE AGREED TO BE CALLED ON 02-18-23 @ 8:30 AM WITH THOSE RESULTS

## 2023-02-11 ENCOUNTER — Ambulatory Visit: Payer: Medicare Other | Admitting: Radiation Oncology

## 2023-02-12 ENCOUNTER — Ambulatory Visit: Payer: Medicare Other | Admitting: Internal Medicine

## 2023-02-13 ENCOUNTER — Other Ambulatory Visit: Payer: Self-pay | Admitting: Radiation Therapy

## 2023-02-13 ENCOUNTER — Telehealth: Payer: Self-pay | Admitting: Radiation Oncology

## 2023-02-13 DIAGNOSIS — C7931 Secondary malignant neoplasm of brain: Secondary | ICD-10-CM

## 2023-02-13 NOTE — Telephone Encounter (Signed)
I called and spoke with the patient to review his favorable MRI results, and he does wish to continue scans every 6 months, but due to his itching that went on for more than a week after his scan despite premedicating him, we will do MRI of the brain without contrast moving forward. If he remains without new disease in the chest or elsewhere, we may consider moving to annual MRI scans knowing that there is not a standard guideline for following patients in this manner. He is in agreement with this plan and we will cancel his appt next week.

## 2023-02-18 ENCOUNTER — Other Ambulatory Visit: Payer: Self-pay | Admitting: Internal Medicine

## 2023-02-18 ENCOUNTER — Ambulatory Visit: Payer: Medicare Other | Admitting: Radiation Oncology

## 2023-02-20 ENCOUNTER — Observation Stay (HOSPITAL_COMMUNITY)
Admission: EM | Admit: 2023-02-20 | Discharge: 2023-02-22 | Disposition: A | Discharge status: Home Health | Payer: Medicare Other | Attending: Internal Medicine | Admitting: Internal Medicine

## 2023-02-20 ENCOUNTER — Emergency Department (HOSPITAL_COMMUNITY): Payer: Medicare Other

## 2023-02-20 ENCOUNTER — Other Ambulatory Visit: Payer: Self-pay

## 2023-02-20 ENCOUNTER — Encounter (HOSPITAL_COMMUNITY): Payer: Self-pay

## 2023-02-20 DIAGNOSIS — Z87891 Personal history of nicotine dependence: Secondary | ICD-10-CM | POA: Insufficient documentation

## 2023-02-20 DIAGNOSIS — E785 Hyperlipidemia, unspecified: Secondary | ICD-10-CM | POA: Diagnosis not present

## 2023-02-20 DIAGNOSIS — R55 Syncope and collapse: Secondary | ICD-10-CM | POA: Diagnosis present

## 2023-02-20 DIAGNOSIS — J449 Chronic obstructive pulmonary disease, unspecified: Secondary | ICD-10-CM

## 2023-02-20 DIAGNOSIS — Z1152 Encounter for screening for COVID-19: Secondary | ICD-10-CM | POA: Diagnosis not present

## 2023-02-20 DIAGNOSIS — Z7901 Long term (current) use of anticoagulants: Secondary | ICD-10-CM | POA: Diagnosis not present

## 2023-02-20 DIAGNOSIS — Z86711 Personal history of pulmonary embolism: Secondary | ICD-10-CM | POA: Insufficient documentation

## 2023-02-20 DIAGNOSIS — R000 Tachycardia, unspecified: Secondary | ICD-10-CM | POA: Diagnosis not present

## 2023-02-20 LAB — COMPREHENSIVE METABOLIC PANEL
ALT: 20 U/L (ref 0–44)
AST: 26 U/L (ref 15–41)
Albumin: 3.4 g/dL — ABNORMAL LOW (ref 3.5–5.0)
Alkaline Phosphatase: 49 U/L (ref 38–126)
Anion gap: 15 (ref 5–15)
BUN: 7 mg/dL — ABNORMAL LOW (ref 8–23)
CO2: 34 mmol/L — ABNORMAL HIGH (ref 22–32)
Calcium: 9.4 mg/dL (ref 8.9–10.3)
Chloride: 92 mmol/L — ABNORMAL LOW (ref 98–111)
Creatinine, Ser: 1.07 mg/dL (ref 0.61–1.24)
GFR, Estimated: >60 mL/min (ref >60)
Glucose, Bld: 104 mg/dL — ABNORMAL HIGH (ref 70–99)
Potassium: 4.1 mmol/L (ref 3.5–5.1)
Sodium: 141 mmol/L (ref 135–145)
Total Bilirubin: 0.3 mg/dL (ref 0.3–1.2)
Total Protein: 6.3 g/dL — ABNORMAL LOW (ref 6.5–8.1)

## 2023-02-20 LAB — CBC WITH DIFFERENTIAL/PLATELET
Abs Immature Granulocytes: 0.07 10*3/uL (ref 0.00–0.07)
Basophils Absolute: 0 10*3/uL (ref 0.0–0.1)
Basophils Relative: 0 %
Eosinophils Absolute: 0 10*3/uL (ref 0.0–0.5)
Eosinophils Relative: 0 %
HCT: 33.8 % — ABNORMAL LOW (ref 39.0–52.0)
Hemoglobin: 10.5 g/dL — ABNORMAL LOW (ref 13.0–17.0)
Immature Granulocytes: 1 %
Lymphocytes Relative: 4 %
Lymphs Abs: 0.5 10*3/uL — ABNORMAL LOW (ref 0.7–4.0)
MCH: 30.1 pg (ref 26.0–34.0)
MCHC: 31.1 g/dL (ref 30.0–36.0)
MCV: 96.8 fL (ref 80.0–100.0)
Monocytes Absolute: 0.7 10*3/uL (ref 0.1–1.0)
Monocytes Relative: 7 %
Neutro Abs: 9.1 10*3/uL — ABNORMAL HIGH (ref 1.7–7.7)
Neutrophils Relative %: 88 %
Platelets: 210 10*3/uL (ref 150–400)
RBC: 3.49 MIL/uL — ABNORMAL LOW (ref 4.22–5.81)
RDW: 13 % (ref 11.5–15.5)
WBC: 10.4 10*3/uL (ref 4.0–10.5)
nRBC: 0 % (ref 0.0–0.2)

## 2023-02-20 LAB — TROPONIN I (HIGH SENSITIVITY): Troponin I (High Sensitivity): 12 ng/L (ref <18)

## 2023-02-20 LAB — BRAIN NATRIURETIC PEPTIDE: B Natriuretic Peptide: 86 pg/mL (ref 0.0–100.0)

## 2023-02-20 MED ORDER — METHYLPREDNISOLONE 4 MG PO TBPK: 8.0000 mg | ORAL_TABLET | Freq: Every evening | ORAL | Status: DC

## 2023-02-20 MED ORDER — OXYCODONE-ACETAMINOPHEN 5-325 MG PO TABS
1.0000 | ORAL_TABLET | Freq: Once | ORAL | Status: AC
  Administered 2023-02-20: 1 via ORAL
  Filled 2023-02-20: qty 1

## 2023-02-20 MED ORDER — METHYLPREDNISOLONE 4 MG PO TBPK: 4.0000 mg | ORAL_TABLET | ORAL | Status: DC

## 2023-02-20 MED ORDER — METHYLPREDNISOLONE 4 MG PO TBPK: 4.0000 mg | ORAL_TABLET | Freq: Four times a day (QID) | ORAL | Status: DC

## 2023-02-20 MED ORDER — FENTANYL CITRATE PF 50 MCG/ML IJ SOSY
25.0000 ug | PREFILLED_SYRINGE | Freq: Once | INTRAMUSCULAR | Status: AC
  Administered 2023-02-20: 25 ug via INTRAVENOUS
  Filled 2023-02-20: qty 1

## 2023-02-20 MED ORDER — OXYCODONE-ACETAMINOPHEN 5-325 MG PO TABS
1.0000 | ORAL_TABLET | Freq: Once | ORAL | Status: AC
  Administered 2023-02-21: 1 via ORAL
  Filled 2023-02-20: qty 1

## 2023-02-20 MED ORDER — METHYLPREDNISOLONE 4 MG PO TBPK: 4.0000 mg | ORAL_TABLET | Freq: Three times a day (TID) | ORAL | Status: DC

## 2023-02-20 MED ORDER — LIDOCAINE-EPINEPHRINE (PF) 2 %-1:200000 IJ SOLN
20.0000 mL | Freq: Once | INTRAMUSCULAR | Status: AC
  Administered 2023-02-21: 20 mL
  Filled 2023-02-20: qty 20

## 2023-02-20 MED ORDER — METHYLPREDNISOLONE 4 MG PO TBPK
8.0000 mg | ORAL_TABLET | Freq: Every morning | ORAL | Status: DC
  Filled 2023-02-20: qty 21

## 2023-02-20 MED ORDER — FENTANYL CITRATE PF 50 MCG/ML IJ SOSY
25.0000 ug | PREFILLED_SYRINGE | Freq: Once | INTRAMUSCULAR | Status: AC
  Administered 2023-02-21: 25 ug via INTRAVENOUS
  Filled 2023-02-20: qty 1

## 2023-02-20 NOTE — H&P (Addendum)
History and Physical   [facility] - [place] @ [facility] Admission History and Physical [employer], D.O.    Patient Name: Francisco [name] MR#: [id number] Date of Birth: 05-09-1946 Date of Admission: 02/20/2023  Referring MD/NP/PA: Dr. Mcneil Sober Primary Care Physician: Shelva Majestic, MD  Chief Complaint:  Chief Complaint  Patient presents with   Dizziness   Fall    HPI: Francisco [name] is a 77 y.o. male with a known history of O2 dependent COPD, PE on Eliquis, SCLC presents to the emergency department for evaluation of syncope.  Patient was in a usual state of health until this afternoon when he walked into his kitchen using his walker, he became unsteady and dizzy.  He called for his wife but by the times she got to him, he had lost consciousness. He awoke nearly immediately and recalled the entire event.  He denies confusion, incontinence, seizure activity. .  Also reports that he has had increased congestion and productive cough, no increase in his O2 requirement, wears 4L around the clock.  Otherwise there has been no change in status. Patient has been taking medication as prescribed and there has been no recent change in medication or diet.  No recent antibiotics.  There has been no recent illness, hospitalizations, travel or sick contacts.    EMS/ED Course: Patient received Fentanyl, oxycodone. Medical admission has been requested for further management of syncope.  Review of Systems:  CONSTITUTIONAL: Positive weakness, dizziness. No fever/chills, fatigue, weight gain/loss, headache. EYES: No blurry or double vision. ENT: No tinnitus, postnasal drip, redness or soreness of the oropharynx. RESPIRATORY: Positive chronic cough, dyspnea, wheeze.  No hemoptysis.  CARDIOVASCULAR: Positive syncope. No chest pain, palpitations, orthopnea. No lower extremity edema.  GASTROINTESTINAL: No nausea, vomiting, abdominal pain, diarrhea, constipation.  No hematemesis, melena or  hematochezia. GENITOURINARY: No dysuria, frequency, hematuria. ENDOCRINE: No polyuria or nocturia. No heat or cold intolerance. HEMATOLOGY: No anemia, bruising, bleeding. INTEGUMENTARY: No rashes, ulcers, lesions. MUSCULOSKELETAL: No arthritis, gout. NEUROLOGIC: No numbness, tingling, ataxia, seizure-type activity, weakness. PSYCHIATRIC: No anxiety, depression, insomnia.   Past Medical History:  Diagnosis Date   Chronic back pain    "mid and lower" (04/07/2018)   Chronic rhinitis    -Sinus Ct 08/01/2009 >> Bilateral maxillary sinusitis with some mucosal thickeningin the sphenoid and frontal sinuses as well with air fluid levels present -chronic rhinitis flyer Aug 04, 2009   Compressed spine fracture [facility])    COPD (chronic obstructive pulmonary disease) (HCC)    PFT's rec Jul 17, 2009   Dyspnea    Emphysema lung (HCC)    On home oxygen therapy    "3L; 24/7" (04/07/2018)   Onychomycosis    Dr. Melvyn Novas   Orthostatic hypotension    "since 10/2017" (04/07/2018)   PAD (peripheral artery disease) (HCC)    Pneumonia    "twice in 1 year" (04/07/2018)   Pulmonary embolism (HCC) 04/07/2018   Skin cancer    "lips, face, ears, arms" (04/07/2018)   Small cell lung cancer, right (HCC) 2023   Dr. Mitzi Hansen.  rad   Vertigo    "since ~ 02/2018" (04/07/2018)    Past Surgical History:  Procedure Laterality Date   ABDOMINAL AORTOGRAM W/LOWER EXTREMITY N/A 01/21/2020   Procedure: ABDOMINAL AORTOGRAM W/LOWER EXTREMITY;  Surgeon: Sherren Kerns, MD;  Location[facility] INVASIVE CV LAB;  Service: Cardiovascular;  Laterality: N/A;   APPENDECTOMY     BIOPSY  02/20/2020   Procedure: BIOPSY;  Surgeon: Meridee Score Netty Starring., MD;  Location:  [facility] ENDOSCOPY;  Service: Gastroenterology;;   BIOPSY  08/12/2022   Procedure: BIOPSY;  Surgeon: Shellia Cleverly, DO;  Location: [facility] ENDOSCOPY;  Service: Gastroenterology;;   BRONCHIAL BIOPSY  09/04/2021   Procedure: BRONCHIAL BIOPSIES;  Surgeon: Josephine Igo, DO;  Location: [facility]  ENDOSCOPY;  Service: Pulmonary;;   BRONCHIAL BRUSHINGS  09/04/2021   Procedure: BRONCHIAL BRUSHINGS;  Surgeon: Josephine Igo, DO;  Location: [facility] ENDOSCOPY;  Service: Pulmonary;;   BRONCHIAL NEEDLE ASPIRATION BIOPSY  09/04/2021   Procedure: BRONCHIAL NEEDLE ASPIRATION BIOPSIES;  Surgeon: Josephine Igo, DO;  Location: [facility] ENDOSCOPY;  Service: Pulmonary;;   CATARACT EXTRACTION, BILATERAL     ENDARTERECTOMY FEMORAL Right 04/19/2019   Procedure: ENDARTERECTOMY RIGHT COMMON FEMORAL;  Surgeon: Sherren Kerns, MD;  Location[facility] OR;  Service: Vascular;  Laterality: Right;   ENDARTERECTOMY FEMORAL Left 01/24/2020   Procedure: LEFT FEMORAL ENDARTERECTOMY WITH DACRON PATCH ANGIOPLASTY;  Surgeon: Sherren Kerns, MD;  Location: [facility] OR;  Service: Vascular;  Laterality: Left;   ESOPHAGOGASTRODUODENOSCOPY (EGD) WITH PROPOFOL N/A 02/20/2020   Procedure: ESOPHAGOGASTRODUODENOSCOPY (EGD) WITH PROPOFOL;  Surgeon: Lemar Lofty., MD;  Location: [facility] ENDOSCOPY;  Service: Gastroenterology;  Laterality: N/A;   ESOPHAGOGASTRODUODENOSCOPY (EGD) WITH PROPOFOL N/A 08/12/2022   Procedure: ESOPHAGOGASTRODUODENOSCOPY (EGD) WITH PROPOFOL;  Surgeon: Shellia Cleverly, DO;  Location: [facility] ENDOSCOPY;  Service: Gastroenterology;  Laterality: N/A;   FEMORAL ENDARTERECTOMY Left 01/24/2020   FIDUCIAL MARKER PLACEMENT  09/04/2021   Procedure: FIDUCIAL MARKER PLACEMENT;  Surgeon: Josephine Igo, DO;  Location: [facility] ENDOSCOPY;  Service: Pulmonary;;   HEMOSTASIS CLIP PLACEMENT  02/20/2020   Procedure: HEMOSTASIS CLIP PLACEMENT;  Surgeon: Lemar Lofty., MD;  Location: [facility] ENDOSCOPY;  Service: Gastroenterology;;   HEMOSTASIS CLIP PLACEMENT  08/12/2022   Procedure: HEMOSTASIS CLIP PLACEMENT;  Surgeon: Shellia Cleverly, DO;  Location: [facility] ENDOSCOPY;  Service: Gastroenterology;;   HOT HEMOSTASIS N/A 02/20/2020   Procedure: HOT HEMOSTASIS (ARGON PLASMA COAGULATION/BICAP);  Surgeon: Lemar Lofty., MD;  Location: [facility]  ENDOSCOPY;  Service: Gastroenterology;  Laterality: N/A;   HOT HEMOSTASIS N/A 08/12/2022   Procedure: HOT HEMOSTASIS (ARGON PLASMA COAGULATION/BICAP);  Surgeon: Shellia Cleverly, DO;  Location: [facility] ENDOSCOPY;  Service: Gastroenterology;  Laterality: N/A;   INSERTION OF ILIAC STENT Left 01/24/2020   Procedure: INSERTION OF VBX STENT 8X59 AND 8X39 LEFT COMMON ILIAC ARTERY. INSERTION OF INNOVA 7 X 60 INNOVA STENT LEFT EXTERNAL ILIAC ARTERY. ;  Surgeon: Sherren Kerns, MD;  Location: [facility] OR;  Service: Vascular;  Laterality: Left;   LOWER EXTREMITY ANGIOGRAPHY  12/11/2018   LOWER EXTREMITY ANGIOGRAPHY N/A 12/11/2018   Procedure: LOWER EXTREMITY ANGIOGRAPHY;  Surgeon: Sherren Kerns, MD;  Location: [facility] INVASIVE CV LAB;  Service: Cardiovascular;  Laterality: N/A;   PATCH ANGIOPLASTY Right 04/19/2019   Procedure: Patch Angioplasty;  Surgeon: Sherren Kerns, MD;  Location: [facility] OR;  Service: Vascular;  Laterality: Right;   PERIPHERAL VASCULAR INTERVENTION Right 12/11/2018   Procedure: PERIPHERAL VASCULAR INTERVENTION;  Surgeon: Sherren Kerns, MD;  Location: [facility] INVASIVE CV LAB;  Service: Cardiovascular;  Laterality: Right;  Common Iliac    SKIN CANCER EXCISION     "lips, face, ears, arms" (04/07/2018)   SUBMUCOSAL INJECTION  08/12/2022   Procedure: SUBMUCOSAL INJECTION;  Surgeon: Shellia Cleverly, DO;  Location: [facility] ENDOSCOPY;  Service: Gastroenterology;;   TONSILLECTOMY     ULTRASOUND GUIDANCE FOR VASCULAR ACCESS Right 01/24/2020   Procedure: ULTRASOUND GUIDANCE FOR VASCULAR ACCESS;  Surgeon: Sherren Kerns, MD;  Location: [facility] OR;  Service: Vascular;  Laterality: Right;   VIDEO BRONCHOSCOPY WITH RADIAL ENDOBRONCHIAL ULTRASOUND  09/04/2021   Procedure: VIDEO BRONCHOSCOPY WITH RADIAL ENDOBRONCHIAL ULTRASOUND;  Surgeon: Josephine Igo, DO;  Location: [facility] ENDOSCOPY;  Service: Pulmonary;;     reports that he quit smoking about 5 years ago. His smoking use included pipe and cigarettes. He started smoking  about 57 years ago. He has a 104 pack-year smoking history. He has never been exposed to tobacco smoke. He has never used smokeless tobacco. He reports current alcohol use of about 11.0 standard drinks of alcohol per week. He reports that he does not use drugs.  Allergies  Allergen Reactions   Tape Other (See Comments)    Skin tears easily.   Augmentin [Amoxicillin-Pot Clavulanate] Other (See Comments)    Thrush Sore throat Hoarse voice   Cipro [Ciprofloxacin Hcl] Nausea And Vomiting    Weakness Fatigue    Gadavist [Gadobutrol] Other (See Comments)    Unknown reaction   Gadolinium Rash    Itchy back rash   Levaquin [Levofloxacin] Other (See Comments)    Hallucinations    Lipitor [Atorvastatin] Rash    Family History  Problem Relation Age of Onset   Heart disease Mother        CABG in her 75s, nonsmoker   Cancer Mother    Stroke Father    Heart disease Father        Died of MI at age 35, smoker   Hepatitis Sister    Coronary artery disease Other        male 1st degree relative <60   Colon cancer Neg Hx    Pancreatic cancer Neg Hx    Esophageal cancer Neg Hx    Inflammatory bowel disease Neg Hx    Liver disease Neg Hx    Rectal cancer Neg Hx    Stomach cancer Neg Hx     Prior to Admission medications   Medication Sig Start Date End Date Taking? Authorizing Provider  acetaminophen (TYLENOL) 500 MG tablet Take 500 mg by mouth every 4 (four) hours as needed for moderate pain, fever, headache or mild pain.    [provider]  albuterol (PROVENTIL) (2.5 MG/3ML) 0.083% nebulizer solution Take 3 mLs (2.5 mg total) by nebulization every 6 (six) hours as needed for wheezing or shortness of breath. 08/17/22   Elgergawy, Leana Roe, MD  azithromycin (ZITHROMAX) 250 MG tablet Take by mouth. 01/02/23   [provider]  Calcium Carbonate-Vitamin D (CALCIUM 600+D PO) Take 2 tablets by mouth daily.    [provider]  docusate sodium (COLACE) 100 MG capsule Take  2 capsules (200 mg total) by mouth 2 (two) times daily. 08/17/22   Elgergawy, Leana Roe, MD  ELIQUIS 2.5 MG TABS tablet TAKE 1 TABLET BY MOUTH TWICE  DAILY Patient taking differently: Take 2.5 mg by mouth 2 (two) times daily. 05/15/22   Shelva Majestic, MD  Ferrous Sulfate (IRON) 325 (65 Fe) MG TABS Take 325 mg by mouth every other day.    [provider]  fluticasone (FLONASE) 50 MCG/ACT nasal spray USE 2 SPRAYS IN BOTH  NOSTRILS DAILY Patient taking differently: Place 2 sprays into both nostrils daily. 03/18/22   Shelva Majestic, MD  folic acid (FOLVITE) 1 MG tablet Take 1 tablet (1 mg total) by mouth daily. 02/26/20   Mikhail, Nita Sells, DO  furosemide (LASIX) 20 MG tablet TAKE 1 TABLET BY MOUTH DAILY AS  NEEDED FOR FLUID OR EDEMA 11/18/22  Shelva Majestic, MD  hydrOXYzine (VISTARIL) 25 MG capsule Take 1 capsule (25 mg total) by mouth every 8 (eight) hours as needed. 02/05/23   Lula Olszewski, MD  Multiple Vitamin (MULTIVITAMIN WITH MINERALS) TABS tablet Take 1 tablet by mouth daily. 02/26/20   Mikhail, Nita Sells, DO  mupirocin cream (BACTROBAN) 2 % Apply 1 Application topically 2 (two) times daily. 08/30/22   Shelva Majestic, MD  neomycin-bacitracin-polymyxin [phone] OINT Apply 1 Application topically 4 (four) times daily as needed. 02/05/23   Lula Olszewski, MD  oxyCODONE (OXY IR/ROXICODONE) 5 MG immediate release tablet Take 5 mg by mouth every 4 (four) hours as needed for severe pain.    [provider]  OXYGEN Inhale 4-5 L into the lungs continuous.     [provider]  pantoprazole (PROTONIX) 20 MG tablet TAKE 1 TABLET BY MOUTH DAILY 01/01/23   Shelva Majestic, MD  Polyethyl Glycol-Propyl Glycol (SYSTANE) 0.4-0.3 % SOLN Place 1 drop into both eyes every other day.    [provider]  predniSONE (DELTASONE) 50 MG tablet Take one tab po 13 hours prior, then one tab 7 hours prior, and then one tab 1 hour prior to CT. Also ask pt to take benadryl 50 mg po  1 hour prior to CT 12/24/22   Ronny Bacon, PA-C  roflumilast (DALIRESP) 500 MCG TABS tablet TAKE 1 TABLET BY MOUTH  DAILY Patient taking differently: Take 500 mcg by mouth daily. 05/03/22   Kalman Shan, MD  rosuvastatin (CRESTOR) 20 MG tablet Take 1 tablet (20 mg total) by mouth daily. 09/11/22   Shelva Majestic, MD  SYMBICORT 160-4.5 MCG/ACT inhaler USE 2 INHALATIONS BY MOUTH TWICE DAILY 10/02/22   Shelva Majestic, MD  Tiotropium Bromide Monohydrate (SPIRIVA RESPIMAT) 2.5 MCG/ACT AERS USE 2 INHALATIONS BY MOUTH ONCE  DAILY 11/25/22   Kalman Shan, MD  traMADol (ULTRAM) 50 MG tablet Take 50 mg by mouth every 12 (twelve) hours as needed for severe pain.    [provider]    Physical Exam: Vitals:   02/20/23 1911 02/20/23 2000 02/20/23 2054 02/20/23 2100  BP: 122/89 (!) 125/96  134/85  Pulse: (!) 125 (!) 119  (!) 117  Resp: 13 15  14   Temp:   98.2 F (36.8 C)   TempSrc:   Oral   SpO2: 97% 95%  95%  Weight:      Height:        GENERAL: 77 y.o.-year-old white male patient, thin frail, lying in the bed in no acute distress.  Pleasant and cooperative.   HEENT: Head atraumatic, normocephalic. Pupils equal. Mucus membranes moist. NECK: Supple. No JVD. CHEST: Coarse breath sounds with slight wheeze. Intermittent cough No use of accessory muscles of respiration.  No reproducible chest wall tenderness.  CARDIOVASCULAR: S1, S2 normal. No murmurs, rubs, or gallops. Cap refill <2 seconds. Pulses intact distally.  ABDOMEN: Soft, nondistended, nontender. No rebound, guarding, rigidity. Normoactive bowel sounds present in all four quadrants.  EXTREMITIES: Puffy edema, nonpitting to bilateral feet. Chronic skin changes LE. No cyanosis, or clubbing. No calf tenderness or Homan's sign.  NEUROLOGIC: The patient is alert and oriented x 3. Cranial nerves II through XII are grossly intact with no focal sensorimotor deficit. PSYCHIATRIC:  Normal affect, mood, thought  content. SKIN: Mulitiple lower extremity skin tears including a large superficial tear at left lateral leg.    Labs on Admission:  CBC: Recent Labs  Lab 02/20/23 1617  WBC 10.4  NEUTROABS 9.1*  HGB 10.5*  HCT 33.8*  MCV 96.8  PLT 210   Basic Metabolic Panel: Recent Labs  Lab 02/20/23 1617  NA 141  K 4.1  CL 92*  CO2 34*  GLUCOSE 104*  BUN 7*  CREATININE 1.07  CALCIUM 9.4   GFR: Estimated Creatinine Clearance: 63.5 mL/min (by C-G formula based on SCr of 1.07 mg/dL). Liver Function Tests: Recent Labs  Lab 02/20/23 1617  AST 26  ALT 20  ALKPHOS 49  BILITOT 0.3  PROT 6.3*  ALBUMIN 3.4*   No results for input(s): "LIPASE", "AMYLASE" in the last 168 hours. No results for input(s): "AMMONIA" in the last 168 hours. Coagulation Profile: No results for input(s): "INR", "PROTIME" in the last 168 hours. Cardiac Enzymes: No results for input(s): "CKTOTAL", "CKMB", "CKMBINDEX", "TROPONINI" in the last 168 hours. BNP (last 3 results) No results for input(s): "PROBNP" in the last 8760 hours. HbA1C: No results for input(s): "HGBA1C" in the last 72 hours. CBG: No results for input(s): "GLUCAP" in the last 168 hours. Lipid Profile: No results for input(s): "CHOL", "HDL", "LDLCALC", "TRIG", "CHOLHDL", "LDLDIRECT" in the last 72 hours. Thyroid Function Tests: No results for input(s): "TSH", "T4TOTAL", "FREET4", "T3FREE", "THYROIDAB" in the last 72 hours. Anemia Panel: No results for input(s): "VITAMINB12", "FOLATE", "FERRITIN", "TIBC", "IRON", "RETICCTPCT" in the last 72 hours. Urine analysis:    Component Value Date/Time   COLORURINE YELLOW 04/15/2019 1530   APPEARANCEUR CLEAR 04/15/2019 1530   LABSPEC 1.012 04/15/2019 1530   PHURINE 7.0 04/15/2019 1530   GLUCOSEU NEGATIVE 04/15/2019 1530   GLUCOSEU NEGATIVE 06/25/2017 0831   HGBUR SMALL (A) 04/15/2019 1530   HGBUR trace-lysed 02/12/2010 0819   BILIRUBINUR NEGATIVE 04/15/2019 1530   BILIRUBINUR Negative 12/25/2017  0852   KETONESUR NEGATIVE 04/15/2019 1530   PROTEINUR NEGATIVE 04/15/2019 1530   UROBILINOGEN 0.2 12/25/2017 0852   UROBILINOGEN 0.2 06/25/2017 0831   NITRITE NEGATIVE 04/15/2019 1530   LEUKOCYTESUR NEGATIVE 04/15/2019 1530   Sepsis Labs: @LABRCNTIP (procalcitonin:4,lacticidven:4) )No results found for this or any previous visit (from the past 240 hour(s)).   Radiological Exams on Admission: DG Chest Portable 1 View  Result Date: 02/20/2023 CLINICAL DATA:  Fall, dizziness EXAM: PORTABLE CHEST 1 VIEW COMPARISON:  CT chest dated 12/24/2022 FINDINGS: Radiation changes in the right mid lung with adjacent surgical clips. Biapical pleural-parenchymal scarring. Right basilar scarring. No pleural effusion or pneumothorax. The heart is normal in size.  Thoracic aortic atherosclerosis. IMPRESSION: No evidence of acute cardiopulmonary disease. Radiation changes in the right mid lung. Electronically Signed   By: Charline Bills M.D.   On: 02/20/2023 17:34    EKG: Sinus tach at 110bpm with normal axis and nonspecific ST-T wave changes.   Assessment/Plan  This is a 77 y.o. male with a history of  O2 dependent COPD, PE on Eliquis, SCLC, HLD, GERD, chronic tachycardia  now being admitted with:  #. Syncope - Admit observation with telemetry monitoring - IV fluid hydration - Check orthostatics - Check echo - Check stool guaiac, trops, TSH, lipids - Local wound care - Fall precautions, PT consult - Cardio consult  #. Tachycardia - patient states is baseline - Monitor on tele  #. O2 dependent COPD, acute on chronic - PO azithro, Medrol dosepak - Nebulizers, O2 therapy and expectorants as needed.  - Check sputum culture - Continuous pulse oximetry - Continue Spiriva, Daliresp, Symbicort,  #. H/O PE - Continue Eliquis  #. History of GERD - Continue Protonix  #. History of HLD -  Continue rosuvastatin  Admission status: Obs, tele IV Fluids: NS Diet/Nutrition: Heart healthy Consults  called: Cardiology, PT DVT Px: Eliquis, early ambulation. Code Status: Full Code  Disposition Plan: To home in less than 24 hours  All the records are reviewed and case discussed with ED provider. Management plans discussed with the patient and/or family who express understanding and agree with plan of care.    D.O. on 02/20/2023 at 10:09 PM CC: Primary care physician; Shelva Majestic, MD   02/20/2023, 10:09 PM

## 2023-02-20 NOTE — ED Provider Notes (Signed)
[place] EMERGENCY DEPARTMENT AT [facility] Provider Note  MDM   HPI/ROS:  Francisco [name] is a 77 y.o. 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  At around 1300 this afternoon, patient stood from a seated position and was ambulating with his walker towards the fridge when he began to feel lightheaded.  After approximately 30 seconds of feeling lightheaded, patient lost consciousness and fell to the ground onto his elbows and knees.  He quickly regained consciousness and returned to normal mental baseline within a few seconds.  Other than some mild increase in the productiveness of his cough as of recently, he has not had any other symptoms.  Patient denies chest pain, shortness of breath, palpitations as of recently or surrounding the event.  He has not experienced fevers, congestion, abdominal pain, nausea/vomiting, diarrhea/constipation, dysuria/urinary frequency urgency etc., and has been tolerating p.o. intake normally.  Patient sustained skin avulsions to bilateral shins and hands.  These were wrapped prior to initial evaluation.  Physical exam is notable for: - Overall well-appearing, no acute distress - Very mild end expiratory wheezes on pulmonary auscultation - Several skin avulsions to left shin, left wrist, left forearm - Bilateral lower extremity edema, chronic per patient and wife  On my initial evaluation, patient is:  -Vital signs stable aside from tachycardia with rates in the 120s.  Patient afebrile, hemodynamically stable, and non-toxic appearing. -Additional history obtained from wife at bedside, chart review  Given the patient's history and physical exam, differential diagnosis includes but is not limited to cardiac arrhythmia, orthostatic syncope, vasovagal syncope, ACS, pulmonary embolism, etc.  Interpretations, interventions, and the patient's  course of care are documented below.    Cardiopulmonary workup initiated.  Initial EKG normal sinus rhythm with tachycardic rate.  No ST changes, interval disturbances, conduction blocks.  Overall reassuring.  Additional labs are within normal limits.  Troponin and BNP within normal limits.  CBC with no leukocytosis, hemoglobin at baseline.  CMP with normal electrolytes and renal function aside from CO2 of 34, consistent with history of COPD.  Chest x-ray without consolidation.  Patient's wounds on his left shin, left forearm, and right shin where thoroughly irrigated, dressed with antibiotic ointment and Xeroform, and wrapped in Kerlix.  Deeper laceration of left hand sutured to achieve subcutaneous closure.  Other skin avulsion/lacerations not amenable to suture/Dermabond given superficiality and skin fragility.  Please see procedure notes for further details.  No tetanus prophylaxis necessary given the patient has received Tdap in the past 5 years.  Upon reassessment, patient remains resting comfortably with no new symptoms.  Claims pain is now 2-3 out of 10.  Large skin avulsion to left shin not amenable to sutures.  Placed antibiotic ointment and xeroform, wrapped in Kerlix dressing.  Slightly deeper laceration to dorsum of left hand sutured with   Given drop syncope in context of high risk patient, to be admitted to hospitalist medicine for telemetry observation.  Please see inpatient provider notes for further details.  Disposition:  I discussed the case with hospitalist medicine who graciously agreed to admit the patient to their service for continued care.   Clinical Impression: No diagnosis found.  Rx / DC Orders ED Discharge Orders     None       The plan for this patient was discussed with Dr. Anitra Lauth, who voiced agreement and who oversaw evaluation and  treatment of this patient.   Clinical Complexity A medically appropriate history, review of systems, and physical exam was  performed.  My independent interpretations of EKG, labs, and radiology are documented in the ED course above.   Click here for ABCD2, HEART and other calculatorsREFRESH Note before signing   Patient's presentation is most consistent with acute complicated illness / injury requiring diagnostic workup.  Medical Decision Making Amount and/or Complexity of Data Reviewed Labs: ordered. Radiology: ordered.  Risk Prescription drug management. Decision regarding hospitalization.    HPI/ROS      See MDM section for pertinent HPI and ROS. A complete ROS was performed with pertinent positives/negatives noted above.   Past Medical History:  Diagnosis Date   Chronic back pain    "mid and lower" (04/07/2018)   Chronic rhinitis    -Sinus Ct 08/01/2009 >> Bilateral maxillary sinusitis with some mucosal thickeningin the sphenoid and frontal sinuses as well with air fluid levels present -chronic rhinitis flyer Aug 04, 2009   Compressed spine fracture [facility])    COPD (chronic obstructive pulmonary disease) (HCC)    PFT's rec Jul 17, 2009   Dyspnea    Emphysema lung (HCC)    On home oxygen therapy    "3L; 24/7" (04/07/2018)   Onychomycosis    Dr. Melvyn Novas   Orthostatic hypotension    "since 10/2017" (04/07/2018)   PAD (peripheral artery disease) (HCC)    Pneumonia    "twice in 1 year" (04/07/2018)   Pulmonary embolism (HCC) 04/07/2018   Skin cancer    "lips, face, ears, arms" (04/07/2018)   Small cell lung cancer, right (HCC) 2023   Dr. Mitzi Hansen.  rad   Vertigo    "since ~ 02/2018" (04/07/2018)    Past Surgical History:  Procedure Laterality Date   ABDOMINAL AORTOGRAM W/LOWER EXTREMITY N/A 01/21/2020   Procedure: ABDOMINAL AORTOGRAM W/LOWER EXTREMITY;  Surgeon: Sherren Kerns, MD;  Location: [facility] INVASIVE CV LAB;  Service: Cardiovascular;  Laterality: N/A;   APPENDECTOMY     BIOPSY  02/20/2020   Procedure: BIOPSY;  Surgeon: Meridee Score Netty Starring., MD;  Location: [facility] ENDOSCOPY;  Service:  Gastroenterology;;   BIOPSY  08/12/2022   Procedure: BIOPSY;  Surgeon: Shellia Cleverly, DO;  Location: [facility] ENDOSCOPY;  Service: Gastroenterology;;   BRONCHIAL BIOPSY  09/04/2021   Procedure: BRONCHIAL BIOPSIES;  Surgeon: Josephine Igo, DO;  Location: [facility] ENDOSCOPY;  Service: Pulmonary;;   BRONCHIAL BRUSHINGS  09/04/2021   Procedure: BRONCHIAL BRUSHINGS;  Surgeon: Josephine Igo, DO;  Location: [facility] ENDOSCOPY;  Service: Pulmonary;;   BRONCHIAL NEEDLE ASPIRATION BIOPSY  09/04/2021   Procedure: BRONCHIAL NEEDLE ASPIRATION BIOPSIES;  Surgeon: Josephine Igo, DO;  Location: [facility] ENDOSCOPY;  Service: Pulmonary;;   CATARACT EXTRACTION, BILATERAL     ENDARTERECTOMY FEMORAL Right 04/19/2019   Procedure: ENDARTERECTOMY RIGHT COMMON FEMORAL;  Surgeon: Sherren Kerns, MD;  Location: [facility] OR;  Service: Vascular;  Laterality: Right;   ENDARTERECTOMY FEMORAL Left 01/24/2020   Procedure: LEFT FEMORAL ENDARTERECTOMY WITH DACRON PATCH ANGIOPLASTY;  Surgeon: Sherren Kerns, MD;  Location: [facility] OR;  Service: Vascular;  Laterality: Left;   ESOPHAGOGASTRODUODENOSCOPY (EGD) WITH PROPOFOL N/A 02/20/2020   Procedure: ESOPHAGOGASTRODUODENOSCOPY (EGD) WITH PROPOFOL;  Surgeon: Lemar Lofty., MD;  Location: [facility] ENDOSCOPY;  Service: Gastroenterology;  Laterality: N/A;   ESOPHAGOGASTRODUODENOSCOPY (EGD) WITH PROPOFOL N/A 08/12/2022   Procedure: ESOPHAGOGASTRODUODENOSCOPY (EGD) WITH PROPOFOL;  Surgeon: Shellia Cleverly, DO;  Location: [facility] ENDOSCOPY;  Service: Gastroenterology;  Laterality: N/A;   FEMORAL ENDARTERECTOMY Left  01/24/2020   FIDUCIAL MARKER PLACEMENT  09/04/2021   Procedure: FIDUCIAL MARKER PLACEMENT;  Surgeon: Josephine Igo, DO;  Location: [facility] ENDOSCOPY;  Service: Pulmonary;;   HEMOSTASIS CLIP PLACEMENT  02/20/2020   Procedure: HEMOSTASIS CLIP PLACEMENT;  Surgeon: Lemar Lofty., MD;  Location: [facility] ENDOSCOPY;  Service: Gastroenterology;;   HEMOSTASIS CLIP PLACEMENT  08/12/2022   Procedure:  HEMOSTASIS CLIP PLACEMENT;  Surgeon: Shellia Cleverly, DO;  Location: [facility] ENDOSCOPY;  Service: Gastroenterology;;   HOT HEMOSTASIS N/A 02/20/2020   Procedure: HOT HEMOSTASIS (ARGON PLASMA COAGULATION/BICAP);  Surgeon: Lemar Lofty., MD;  Location: [facility] ENDOSCOPY;  Service: Gastroenterology;  Laterality: N/A;   HOT HEMOSTASIS N/A 08/12/2022   Procedure: HOT HEMOSTASIS (ARGON PLASMA COAGULATION/BICAP);  Surgeon: Shellia Cleverly, DO;  Location: [facility] ENDOSCOPY;  Service: Gastroenterology;  Laterality: N/A;   INSERTION OF ILIAC STENT Left 01/24/2020   Procedure: INSERTION OF VBX STENT 8X59 AND 8X39 LEFT COMMON ILIAC ARTERY. INSERTION OF INNOVA 7 X 60 INNOVA STENT LEFT EXTERNAL ILIAC ARTERY. ;  Surgeon: Sherren Kerns, MD;  Location: [facility] OR;  Service: Vascular;  Laterality: Left;   LOWER EXTREMITY ANGIOGRAPHY  12/11/2018   LOWER EXTREMITY ANGIOGRAPHY N/A 12/11/2018   Procedure: LOWER EXTREMITY ANGIOGRAPHY;  Surgeon: Sherren Kerns, MD;  [facility] INVASIVE CV LAB;  Service: Cardiovascular;  Laterality: N/A;   PATCH ANGIOPLASTY Right 04/19/2019   Procedure: Patch Angioplasty;  Surgeon: Sherren Kerns, MD;  Location: [facility] OR;  Service: Vascular;  Laterality: Right;   PERIPHERAL VASCULAR INTERVENTION Right 12/11/2018   Procedure: PERIPHERAL VASCULAR INTERVENTION;  Surgeon: Sherren Kerns, MD;  [facility]: [facility] INVASIVE CV LAB;  Service: Cardiovascular;  Laterality: Right;  Common Iliac    SKIN CANCER EXCISION     "lips, face, ears, arms" (04/07/2018)   SUBMUCOSAL INJECTION  08/12/2022   Procedure: SUBMUCOSAL INJECTION;  Surgeon: Shellia Cleverly, DO;  Location: [facility] ENDOSCOPY;  Service: Gastroenterology;;   TONSILLECTOMY     ULTRASOUND GUIDANCE FOR VASCULAR ACCESS Right 01/24/2020   Procedure: ULTRASOUND GUIDANCE FOR VASCULAR ACCESS;  Surgeon: Sherren Kerns, MD;  Location: [facility] OR;  Service: Vascular;  Laterality: Right;   VIDEO BRONCHOSCOPY WITH RADIAL ENDOBRONCHIAL ULTRASOUND  09/04/2021    Procedure: VIDEO BRONCHOSCOPY WITH RADIAL ENDOBRONCHIAL ULTRASOUND;  Surgeon: Josephine Igo, DO;  Location: [facility] ENDOSCOPY;  Service: Pulmonary;;      Physical Exam   Vitals:   02/21/23 0030 02/21/23 0112 02/21/23 0216 02/21/23 0221  BP: 135/73   108/75  Pulse: (!) 118   (!) 191  Resp: 19   (!) 23  Temp:  98.2 F (36.8 C)  97.7 F (36.5 C)  TempSrc:  Oral  Oral  SpO2: 99%   97%  Weight:   75.7 kg   Height:   6' (1.829 m)     Physical Exam Vitals and nursing note reviewed.  Constitutional:      General: He is not in acute distress.    Appearance: He is well-developed.  HENT:     Head: Normocephalic and atraumatic.  Eyes:     Conjunctiva/sclera: Conjunctivae normal.  Cardiovascular:     Rate and Rhythm: Normal rate and regular rhythm.     Heart sounds: No murmur heard. Pulmonary:     Effort: Pulmonary effort is normal. No respiratory distress.     Breath sounds: Normal breath sounds. No wheezing.  Abdominal:     Palpations: Abdomen is soft.     Tenderness: There is no abdominal tenderness.  Musculoskeletal:  General: No swelling.     Cervical back: Neck supple.     Right lower leg: Edema present.     Left lower leg: Edema present.  Skin:    General: Skin is warm and dry.     Capillary Refill: Capillary refill takes less than 2 seconds.  Neurological:     Mental Status: He is alert.  Psychiatric:        Mood and Affect: Mood normal.      Procedures    .[name] KitchenLaceration Repair  Date/Time: 02/21/2023 2:56 AM  Performed by: Dyanne Iha, MD Authorized by: Gwyneth Sprout, MD   Consent:    Consent obtained:  Verbal   Consent given by:  Patient   Risks, benefits, and alternatives were discussed: yes   Universal protocol:    Patient identity confirmed:  Verbally with patient, hospital-assigned identification number and arm band Anesthesia:    Anesthesia method:  Local infiltration   Local anesthetic:  Lidocaine 1% WITH epi Laceration details:     Location:  Hand   Hand location:  L wrist   Length (cm):  1.5   Depth (mm):  2 Pre-procedure details:    Preparation:  Patient was prepped and draped in usual sterile fashion Exploration:    Wound exploration: wound explored through full range of motion     Wound extent: no signs of injury, no nerve damage, no tendon damage and no vascular damage     Contaminated: no   Treatment:    Area cleansed with:  Saline   Amount of cleaning:  Extensive   Irrigation solution:  Sterile saline   Irrigation volume:  250   Irrigation method:  Syringe   Visualized foreign bodies/material removed: no   Skin repair:    Repair method:  Sutures   Suture size:  4-0   Suture material:  Fast-absorbing gut   Suture technique:  Simple interrupted   Number of sutures:  3 Approximation:    Approximation:  Close Repair type:    Repair type:  Simple Post-procedure details:    Dressing:  Antibiotic ointment, bulky dressing and non-adherent dressing   Procedure completion:  Tolerated well, no immediate complications Comments:     Deep dermal stitch placed followed by two sutures that were placed through skin and subcutaneous tissue to achieve adequate closure given extremely thin skin.  Absorbable sutures used given fragility of skin and subcutaneous tissue.  Dressed with antibiotic ointment Xeroform and Kerlix   Will Mcneil Sober, MD Department of Emergency Medicine   Please note that this documentation was produced with the assistance of voice-to-text technology and may contain errors.    Dyanne Iha, MD 02/21/23 7829    Gwyneth Sprout, MD 02/21/23 2253

## 2023-02-20 NOTE — ED Triage Notes (Signed)
Pt arrives via EMS from home. Pt was getting up out of his chair, using his walker, when he became dizzy and fell. Pt c/o pain to bilateral leg and left arm. Has noted abrasions and skin tears. Pt denies hitting his head, no LOC, pt is AxOx4. Pt denies dizziness at this time, denies any other symptoms.

## 2023-02-20 NOTE — ED Notes (Signed)
Pt's bandages removed for provider assessment. Bilateral leg bandages and left arm bandage.  Bleeding controlled at this time.

## 2023-02-20 NOTE — ED Provider Notes (Incomplete)
[place] EMERGENCY DEPARTMENT AT [facility] Provider Note  MDM   HPI/ROS:  Francisco [name] is a 77 y.o. 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  At around 1300 this afternoon, patient stood from a seated position and was ambulating with his walker towards the fridge when he began to feel lightheaded.  After approximately 30 seconds of feeling lightheaded, patient lost consciousness and fell to the ground onto his elbows and knees.  He quickly regained consciousness and returned to normal mental baseline within a few seconds.  Other than some mild increase in the productiveness of his cough as of recently, he has not had any other symptoms.  Patient denies chest pain, shortness of breath, palpitations surrounding the event.  He has not experienced fevers, congestion, abdominal pain, nausea/vomiting, diarrhea/constipation, dysuria/urinary frequency urgency etc., and has been tolerating p.o. intake normally.  Patient sustained skin avulsions to bilateral shins and hands.  These were wrapped prior to initial evaluation.  Physical exam is notable for: - Overall well-appearing, no acute distress - Very mild end expiratory wheezes on pulmonary auscultation - Several skin avulsions to left shin, left wrist,  On my initial evaluation, patient is:  -Vital signs stable aside from tachycardia with rates in the 120s.  Patient afebrile, hemodynamically stable, and non-toxic appearing. -Additional history obtained from wife at bedside, chart review  Given the patient's history and physical exam, differential diagnosis includes but is not limited to cardiac arrhythmia, orthostatic syncope, vasovagal syncope, ACS, pulmonary embolism, etc.  Interpretations, interventions, and the patient's course of care are documented below.    Cardiopulmonary workup initiated.  Initial EKG normal  sinus rhythm with tachycardic rate.  No ST changes, interval disturbances, conduction blocks.  Overall reassuring.  Additional labs are within normal limits.  Troponin and BNP within normal limits.  CBC with no leukocytosis, hemoglobin at baseline.  CMP with normal electrolytes and renal function aside from CO2 of 34, consistent with history of COPD.  Upon reassessment, patient remains resting comfortably with no new symptoms.  Claims pain is now 2-3 out of 10.  Large skin avulsion to left  Given drop syncope in context of high risk patient, to be admitted for telemetry observation.  Please see inpatient provider notes for further details.  Disposition:  {ED Dispo:29898}  Clinical Impression: No diagnosis found.  Rx / DC Orders ED Discharge Orders     None       The plan for this patient was discussed with Dr. ***, who voiced agreement and who oversaw evaluation and treatment of this patient.   Clinical Complexity A medically appropriate history, review of systems, and physical exam was performed.  My independent interpretations of EKG, labs, and radiology are documented in the ED course above.   Click here for ABCD2, HEART and other calculatorsREFRESH Note before signing   Patient's presentation is most consistent with {EM COPA:27473}  Medical Decision Making Amount and/or Complexity of Data Reviewed Labs: ordered. Radiology: ordered.  Risk Prescription drug management. Decision regarding hospitalization.    HPI/ROS      See MDM section for pertinent HPI and ROS. A complete ROS was performed with pertinent positives/negatives noted above.   Past Medical History:  Diagnosis Date  . Chronic back pain    "mid and lower" (04/07/2018)  . Chronic rhinitis    -Sinus Ct 08/01/2009 >> Bilateral maxillary sinusitis with  some mucosal thickeningin the sphenoid and frontal sinuses as well with air fluid levels present -chronic rhinitis flyer Aug 04, 2009  . Compressed spine  fracture (HCC)   . COPD (chronic obstructive pulmonary disease) [facility])    PFT's rec Jul 17, 2009  . Dyspnea   . Emphysema lung (HCC)   . On home oxygen therapy    "3L; 24/7" (04/07/2018)  . Onychomycosis    Dr. Melvyn Novas  . Orthostatic hypotension    "since 10/2017" (04/07/2018)  . PAD (peripheral artery disease) (HCC)   . Pneumonia    "twice in 1 year" (04/07/2018)  . Pulmonary embolism (HCC) 04/07/2018  . Skin cancer    "lips, face, ears, arms" (04/07/2018)  . Small cell lung cancer, right (HCC) 2023   Dr. Mitzi Hansen.  rad  . Vertigo    "since ~ 02/2018" (04/07/2018)    Past Surgical History:  Procedure Laterality Date  . ABDOMINAL AORTOGRAM W/LOWER EXTREMITY N/A 01/21/2020   Procedure: ABDOMINAL AORTOGRAM W/LOWER EXTREMITY;  Surgeon: Sherren Kerns, MD;  [facility]: [facility] INVASIVE CV LAB;  Service: Cardiovascular;  Laterality: N/A;  . APPENDECTOMY    . BIOPSY  02/20/2020   Procedure: BIOPSY;  Surgeon: Meridee Score Netty Starring., MD;  Location: [facility] ENDOSCOPY;  Service: Gastroenterology;;  . BIOPSY  08/12/2022   Procedure: BIOPSY;  Surgeon: Shellia Cleverly, DO;  Location: [facility] ENDOSCOPY;  Service: Gastroenterology;;  . BRONCHIAL BIOPSY  09/04/2021   Procedure: BRONCHIAL BIOPSIES;  Surgeon: Josephine Igo, DO;  Location: [facility] ENDOSCOPY;  Service: Pulmonary;;  . BRONCHIAL BRUSHINGS  09/04/2021   Procedure: BRONCHIAL BRUSHINGS;  Surgeon: Josephine Igo, DO;  Location: [facility] ENDOSCOPY;  Service: Pulmonary;;  . BRONCHIAL NEEDLE ASPIRATION BIOPSY  09/04/2021   Procedure: BRONCHIAL NEEDLE ASPIRATION BIOPSIES;  Surgeon: Josephine Igo, DO;  Location: [facility] ENDOSCOPY;  Service: Pulmonary;;  . CATARACT EXTRACTION, BILATERAL    . ENDARTERECTOMY FEMORAL Right 04/19/2019   Procedure: ENDARTERECTOMY RIGHT COMMON FEMORAL;  Surgeon: Sherren Kerns, MD;  Location: [facility] OR;  Service: Vascular;  Laterality: Right;  . ENDARTERECTOMY FEMORAL Left 01/24/2020   Procedure: LEFT FEMORAL ENDARTERECTOMY WITH DACRON PATCH  ANGIOPLASTY;  Surgeon: Sherren Kerns, MD;  Location: [facility] OR;  Service: Vascular;  Laterality: Left;  . ESOPHAGOGASTRODUODENOSCOPY (EGD) WITH PROPOFOL N/A 02/20/2020   Procedure: ESOPHAGOGASTRODUODENOSCOPY (EGD) WITH PROPOFOL;  Surgeon: Lemar Lofty., MD;  Location: [facility] ENDOSCOPY;  Service: Gastroenterology;  Laterality: N/A;  . ESOPHAGOGASTRODUODENOSCOPY (EGD) WITH PROPOFOL N/A 08/12/2022   Procedure: ESOPHAGOGASTRODUODENOSCOPY (EGD) WITH PROPOFOL;  Surgeon: Shellia Cleverly, DO;  [facility] ENDOSCOPY;  Service: Gastroenterology;  Laterality: N/A;  . FEMORAL ENDARTERECTOMY Left 01/24/2020  . FIDUCIAL MARKER PLACEMENT  09/04/2021   Procedure: FIDUCIAL MARKER PLACEMENT;  Surgeon: Josephine Igo, DO;  Location: [facility] ENDOSCOPY;  Service: Pulmonary;;  . HEMOSTASIS CLIP PLACEMENT  02/20/2020   Procedure: HEMOSTASIS CLIP PLACEMENT;  Surgeon: Lemar Lofty., MD;  Location: [facility] ENDOSCOPY;  Service: Gastroenterology;;  . HEMOSTASIS CLIP PLACEMENT  08/12/2022   Procedure: HEMOSTASIS CLIP PLACEMENT;  Surgeon: Shellia Cleverly, DO;  Location: [facility] ENDOSCOPY;  Service: Gastroenterology;;  . HOT HEMOSTASIS N/A 02/20/2020   Procedure: HOT HEMOSTASIS (ARGON PLASMA COAGULATION/BICAP);  Surgeon: Lemar Lofty., MD;  Location: [facility] ENDOSCOPY;  Service: Gastroenterology;  Laterality: N/A;  . HOT HEMOSTASIS N/A 08/12/2022   Procedure: HOT HEMOSTASIS (ARGON PLASMA COAGULATION/BICAP);  Surgeon: Shellia Cleverly, DO;  Location: [facility] ENDOSCOPY;  Service: Gastroenterology;  Laterality: N/A;  . INSERTION OF ILIAC STENT Left 01/24/2020  Procedure: INSERTION OF VBX STENT [id number] AND 8X39 LEFT COMMON ILIAC ARTERY. INSERTION OF INNOVA 7 X 60 INNOVA STENT LEFT EXTERNAL ILIAC ARTERY. ;  Surgeon: Sherren Kerns, MD;  Location: [facility] OR;  Service: Vascular;  Laterality: Left;  . LOWER EXTREMITY ANGIOGRAPHY  12/11/2018  . LOWER EXTREMITY ANGIOGRAPHY N/A 12/11/2018   Procedure: LOWER EXTREMITY ANGIOGRAPHY;  Surgeon:  Sherren Kerns, MD;  [facility] INVASIVE CV LAB;  Service: Cardiovascular;  Laterality: N/A;  . PATCH ANGIOPLASTY Right 04/19/2019   Procedure: Patch Angioplasty;  Surgeon: Sherren Kerns, MD;  Location: [facility] OR;  Service: Vascular;  Laterality: Right;  . PERIPHERAL VASCULAR INTERVENTION Right 12/11/2018   Procedure: PERIPHERAL VASCULAR INTERVENTION;  Surgeon: Sherren Kerns, MD;  Location: [facility] INVASIVE CV LAB;  Service: Cardiovascular;  Laterality: Right;  Common Iliac   . SKIN CANCER EXCISION     "lips, face, ears, arms" (04/07/2018)  . SUBMUCOSAL INJECTION  08/12/2022   Procedure: SUBMUCOSAL INJECTION;  Surgeon: Shellia Cleverly, DO;  Location: [facility] ENDOSCOPY;  Service: Gastroenterology;;  . TONSILLECTOMY    . ULTRASOUND GUIDANCE FOR VASCULAR ACCESS Right 01/24/2020   Procedure: ULTRASOUND GUIDANCE FOR VASCULAR ACCESS;  Surgeon: Sherren Kerns, MD;  Location: [facility] OR;  Service: Vascular;  Laterality: Right;  [name] Kitchen VIDEO BRONCHOSCOPY WITH RADIAL ENDOBRONCHIAL ULTRASOUND  09/04/2021   Procedure: VIDEO BRONCHOSCOPY WITH RADIAL ENDOBRONCHIAL ULTRASOUND;  Surgeon: Josephine Igo, DO;  Location: [facility] ENDOSCOPY;  Service: Pulmonary;;      Physical Exam   Vitals:   02/20/23 1523 02/20/23 1524 02/20/23 1525 02/20/23 1530  BP:   130/75 124/81  Pulse:  (!) 110  (!) 109  Resp:  19  12  Temp:  98.4 F (36.9 C)    SpO2:  91%  94%  Weight: 75.3 kg     Height: 6' (1.829 m)       Physical Exam   Procedures    Procedures   Starleen Arms, MD Department of Emergency Medicine   Please note that this documentation was produced with the assistance of voice-to-text technology and may contain errors.

## 2023-02-20 NOTE — ED Notes (Addendum)
Verbal from MD indicating holding on CTA for the moment

## 2023-02-21 ENCOUNTER — Observation Stay (HOSPITAL_BASED_OUTPATIENT_CLINIC_OR_DEPARTMENT_OTHER): Payer: Medicare Other

## 2023-02-21 ENCOUNTER — Encounter: Payer: Self-pay | Admitting: Internal Medicine

## 2023-02-21 ENCOUNTER — Encounter: Payer: Self-pay | Admitting: Family Medicine

## 2023-02-21 DIAGNOSIS — R55 Syncope and collapse: Secondary | ICD-10-CM | POA: Diagnosis not present

## 2023-02-21 LAB — EXPECTORATED SPUTUM ASSESSMENT W GRAM STAIN, RFLX TO RESP C

## 2023-02-21 LAB — CBC
HCT: 31.1 % — ABNORMAL LOW (ref 39.0–52.0)
Hemoglobin: 9.8 g/dL — ABNORMAL LOW (ref 13.0–17.0)
MCH: 30.4 pg (ref 26.0–34.0)
MCHC: 31.5 g/dL (ref 30.0–36.0)
MCV: 96.6 fL (ref 80.0–100.0)
Platelets: 173 10*3/uL (ref 150–400)
RBC: 3.22 MIL/uL — ABNORMAL LOW (ref 4.22–5.81)
RDW: 13 % (ref 11.5–15.5)
WBC: 9.7 10*3/uL (ref 4.0–10.5)
nRBC: 0 % (ref 0.0–0.2)

## 2023-02-21 LAB — URINALYSIS, ROUTINE W REFLEX MICROSCOPIC
Bilirubin Urine: NEGATIVE
Glucose, UA: NEGATIVE mg/dL
Hgb urine dipstick: NEGATIVE
Ketones, ur: 5 mg/dL — AB
Leukocytes,Ua: NEGATIVE
Nitrite: NEGATIVE
Protein, ur: NEGATIVE mg/dL
Specific Gravity, Urine: 1.012 (ref 1.005–1.030)
pH: 7 (ref 5.0–8.0)

## 2023-02-21 LAB — BASIC METABOLIC PANEL
Anion gap: 12 (ref 5–15)
BUN: 15 mg/dL (ref 8–23)
CO2: 33 mmol/L — ABNORMAL HIGH (ref 22–32)
Calcium: 8.7 mg/dL — ABNORMAL LOW (ref 8.9–10.3)
Chloride: 92 mmol/L — ABNORMAL LOW (ref 98–111)
Creatinine, Ser: 1.2 mg/dL (ref 0.61–1.24)
GFR, Estimated: >60 mL/min (ref >60)
Glucose, Bld: 140 mg/dL — ABNORMAL HIGH (ref 70–99)
Potassium: 4.4 mmol/L (ref 3.5–5.1)
Sodium: 137 mmol/L (ref 135–145)

## 2023-02-21 LAB — LIPID PANEL
Cholesterol: 168 mg/dL (ref 0–200)
HDL: 83 mg/dL (ref >40)
LDL Cholesterol: 73 mg/dL (ref 0–99)
Total CHOL/HDL Ratio: 2 RATIO
Triglycerides: 62 mg/dL (ref <150)
VLDL: 12 mg/dL (ref 0–40)

## 2023-02-21 LAB — ECHOCARDIOGRAM COMPLETE
AR max vel: 1.68 cm2
AV Area VTI: 1.75 cm2
AV Area mean vel: 1.76 cm2
AV Mean grad: 6.7 mmHg
AV Peak grad: 13.2 mmHg
Ao pk vel: 1.82 m/s
Area-P 1/2: 6.54 cm2
Calc EF: 59.3 %
Height: 72 in
S' Lateral: 3.5 cm
Single Plane A2C EF: 55.4 %
Single Plane A4C EF: 60.3 %
Weight: 2670.21 oz

## 2023-02-21 LAB — SARS CORONAVIRUS 2 BY RT PCR: SARS Coronavirus 2 by RT PCR: NEGATIVE

## 2023-02-21 LAB — TROPONIN I (HIGH SENSITIVITY): Troponin I (High Sensitivity): 17 ng/L (ref <18)

## 2023-02-21 LAB — MAGNESIUM: Magnesium: 1.8 mg/dL (ref 1.7–2.4)

## 2023-02-21 LAB — GLUCOSE, CAPILLARY: Glucose-Capillary: 95 mg/dL (ref 70–99)

## 2023-02-21 LAB — TSH: TSH: 1.055 u[IU]/mL (ref 0.350–4.500)

## 2023-02-21 MED ORDER — MAGNESIUM CITRATE PO SOLN: 1.0000 | Freq: Once | ORAL | Status: DC | PRN

## 2023-02-21 MED ORDER — ONDANSETRON HCL 4 MG PO TABS: 4.0000 mg | ORAL_TABLET | Freq: Four times a day (QID) | ORAL | Status: DC | PRN

## 2023-02-21 MED ORDER — TIOTROPIUM BROMIDE MONOHYDRATE 18 MCG IN CAPS: 18.0000 ug | ORAL_CAPSULE | Freq: Every day | RESPIRATORY_TRACT | Status: DC

## 2023-02-21 MED ORDER — ONDANSETRON HCL 4 MG/2ML IJ SOLN: 4.0000 mg | Freq: Four times a day (QID) | INTRAMUSCULAR | Status: DC | PRN

## 2023-02-21 MED ORDER — IRON 325 (65 FE) MG PO TABS: 325.0000 mg | ORAL_TABLET | ORAL | Status: DC

## 2023-02-21 MED ORDER — FLUTICASONE PROPIONATE 50 MCG/ACT NA SUSP
2.0000 | Freq: Every day | NASAL | Status: DC
  Administered 2023-02-22: 2 via NASAL
  Filled 2023-02-21: qty 16

## 2023-02-21 MED ORDER — UMECLIDINIUM BROMIDE 62.5 MCG/ACT IN AEPB
1.0000 | INHALATION_SPRAY | Freq: Every day | RESPIRATORY_TRACT | Status: DC
  Filled 2023-02-21: qty 7

## 2023-02-21 MED ORDER — ROSUVASTATIN CALCIUM 20 MG PO TABS
20.0000 mg | ORAL_TABLET | Freq: Every day | ORAL | Status: DC
  Administered 2023-02-21 – 2023-02-22 (×2): 20 mg via ORAL
  Filled 2023-02-21 (×2): qty 1

## 2023-02-21 MED ORDER — ACETAMINOPHEN 325 MG PO TABS
650.0000 mg | ORAL_TABLET | Freq: Four times a day (QID) | ORAL | Status: DC | PRN
  Administered 2023-02-21 – 2023-02-22 (×2): 650 mg via ORAL
  Filled 2023-02-21 (×2): qty 2

## 2023-02-21 MED ORDER — PANTOPRAZOLE SODIUM 20 MG PO TBEC
20.0000 mg | DELAYED_RELEASE_TABLET | Freq: Every day | ORAL | Status: DC
  Administered 2023-02-21 – 2023-02-22 (×2): 20 mg via ORAL
  Filled 2023-02-21 (×2): qty 1

## 2023-02-21 MED ORDER — MOMETASONE FURO-FORMOTEROL FUM 200-5 MCG/ACT IN AERO
2.0000 | INHALATION_SPRAY | Freq: Two times a day (BID) | RESPIRATORY_TRACT | Status: DC
  Filled 2023-02-21 (×3): qty 8.8

## 2023-02-21 MED ORDER — ADULT MULTIVITAMIN W/MINERALS CH
1.0000 | ORAL_TABLET | Freq: Every day | ORAL | Status: DC
  Administered 2023-02-21 – 2023-02-22 (×2): 1 via ORAL
  Filled 2023-02-21 (×2): qty 1

## 2023-02-21 MED ORDER — OXYCODONE HCL 5 MG PO TABS
5.0000 mg | ORAL_TABLET | ORAL | Status: DC | PRN
  Filled 2023-02-21: qty 1

## 2023-02-21 MED ORDER — FOLIC ACID 1 MG PO TABS
1.0000 mg | ORAL_TABLET | Freq: Every day | ORAL | Status: DC
  Administered 2023-02-21 – 2023-02-22 (×2): 1 mg via ORAL
  Filled 2023-02-21 (×2): qty 1

## 2023-02-21 MED ORDER — OYSTER SHELL CALCIUM/D3 500-5 MG-MCG PO TABS
1.0000 | ORAL_TABLET | Freq: Every day | ORAL | Status: DC
  Administered 2023-02-21 – 2023-02-22 (×2): 1 via ORAL
  Filled 2023-02-21 (×2): qty 1

## 2023-02-21 MED ORDER — METHYLPREDNISOLONE 4 MG PO TBPK: 4.0000 mg | ORAL_TABLET | ORAL | Status: AC

## 2023-02-21 MED ORDER — ROFLUMILAST 500 MCG PO TABS
500.0000 ug | ORAL_TABLET | Freq: Every day | ORAL | Status: DC
  Administered 2023-02-21 – 2023-02-22 (×2): 500 ug via ORAL
  Filled 2023-02-21 (×2): qty 1

## 2023-02-21 MED ORDER — ZINC OXIDE 40 % EX OINT
TOPICAL_OINTMENT | Freq: Two times a day (BID) | CUTANEOUS | Status: DC
  Filled 2023-02-21: qty 57

## 2023-02-21 MED ORDER — METHYLPREDNISOLONE 4 MG PO TBPK: 8.0000 mg | ORAL_TABLET | Freq: Every evening | ORAL | Status: DC

## 2023-02-21 MED ORDER — BISACODYL 5 MG PO TBEC
5.0000 mg | DELAYED_RELEASE_TABLET | Freq: Every day | ORAL | Status: DC | PRN
  Administered 2023-02-22: 5 mg via ORAL
  Filled 2023-02-21: qty 1

## 2023-02-21 MED ORDER — PERFLUTREN LIPID MICROSPHERE
1.0000 mL | INTRAVENOUS | Status: AC | PRN
  Administered 2023-02-21: 3 mL via INTRAVENOUS

## 2023-02-21 MED ORDER — DOCUSATE SODIUM 100 MG PO CAPS
200.0000 mg | ORAL_CAPSULE | Freq: Two times a day (BID) | ORAL | Status: DC | PRN
  Administered 2023-02-21 – 2023-02-22 (×3): 200 mg via ORAL
  Filled 2023-02-21 (×4): qty 2

## 2023-02-21 MED ORDER — ALUM & MAG HYDROXIDE-SIMETH 200-200-20 MG/5ML PO SUSP: 30.0000 mL | Freq: Four times a day (QID) | ORAL | Status: DC | PRN

## 2023-02-21 MED ORDER — SENNOSIDES-DOCUSATE SODIUM 8.6-50 MG PO TABS
1.0000 | ORAL_TABLET | Freq: Every evening | ORAL | Status: DC | PRN
  Filled 2023-02-21: qty 1

## 2023-02-21 MED ORDER — SODIUM CHLORIDE 0.9 % IV SOLN: INTRAVENOUS | Status: DC

## 2023-02-21 MED ORDER — METHYLPREDNISOLONE 4 MG PO TBPK
8.0000 mg | ORAL_TABLET | Freq: Every evening | ORAL | Status: AC
  Administered 2023-02-21: 8 mg via ORAL

## 2023-02-21 MED ORDER — METHYLPREDNISOLONE 4 MG PO TBPK
4.0000 mg | ORAL_TABLET | ORAL | Status: AC
  Administered 2023-02-21: 4 mg via ORAL

## 2023-02-21 MED ORDER — METHYLPREDNISOLONE 4 MG PO TBPK
4.0000 mg | ORAL_TABLET | Freq: Three times a day (TID) | ORAL | Status: DC
  Administered 2023-02-22 (×2): 4 mg via ORAL

## 2023-02-21 MED ORDER — ALBUTEROL SULFATE (2.5 MG/3ML) 0.083% IN NEBU: 2.5000 mg | INHALATION_SOLUTION | Freq: Four times a day (QID) | RESPIRATORY_TRACT | Status: DC | PRN

## 2023-02-21 MED ORDER — ACETAMINOPHEN 650 MG RE SUPP: 650.0000 mg | Freq: Four times a day (QID) | RECTAL | Status: DC | PRN

## 2023-02-21 MED ORDER — AZITHROMYCIN 250 MG PO TABS
250.0000 mg | ORAL_TABLET | ORAL | Status: DC
  Administered 2023-02-21: 250 mg via ORAL
  Filled 2023-02-21 (×2): qty 1

## 2023-02-21 MED ORDER — CALCIUM CARB-CHOLECALCIFEROL 600-10 MG-MCG PO TABS
ORAL_TABLET | Freq: Every day | ORAL | Status: DC
Start: 2023-02-21 — End: 2023-02-21

## 2023-02-21 MED ORDER — METHYLPREDNISOLONE 4 MG PO TBPK
8.0000 mg | ORAL_TABLET | Freq: Every morning | ORAL | Status: AC
  Administered 2023-02-21: 8 mg via ORAL
  Filled 2023-02-21: qty 21

## 2023-02-21 MED ORDER — TRAMADOL HCL 50 MG PO TABS
50.0000 mg | ORAL_TABLET | Freq: Two times a day (BID) | ORAL | Status: DC | PRN
  Administered 2023-02-22: 50 mg via ORAL
  Filled 2023-02-21: qty 1

## 2023-02-21 MED ORDER — HYDRALAZINE HCL 20 MG/ML IJ SOLN: 5.0000 mg | Freq: Three times a day (TID) | INTRAMUSCULAR | Status: DC | PRN

## 2023-02-21 MED ORDER — SODIUM CHLORIDE 0.9% FLUSH
3.0000 mL | Freq: Two times a day (BID) | INTRAVENOUS | Status: DC
  Administered 2023-02-21 (×2): 3 mL via INTRAVENOUS

## 2023-02-21 MED ORDER — APIXABAN 2.5 MG PO TABS
2.5000 mg | ORAL_TABLET | Freq: Two times a day (BID) | ORAL | Status: DC
  Administered 2023-02-21 – 2023-02-22 (×3): 2.5 mg via ORAL
  Filled 2023-02-21 (×3): qty 1

## 2023-02-21 MED ORDER — METHYLPREDNISOLONE 4 MG PO TBPK: 4.0000 mg | ORAL_TABLET | Freq: Four times a day (QID) | ORAL | Status: DC

## 2023-02-21 MED ORDER — FERROUS SULFATE 325 (65 FE) MG PO TABS
325.0000 mg | ORAL_TABLET | ORAL | Status: DC
  Administered 2023-02-21: 325 mg via ORAL
  Filled 2023-02-21 (×2): qty 1

## 2023-02-21 NOTE — Care Management Obs Status (Signed)
MEDICARE OBSERVATION STATUS NOTIFICATION   Patient Details  Name: Francisco [name] MRN: [id number] Date of Birth: [birth year]   Medicare Observation Status Notification Given:  Yes    Leone Haven, RN 02/21/2023, 5:05 PM

## 2023-02-21 NOTE — Evaluation (Signed)
Physical Therapy Evaluation Patient Details Name: Francisco [name] MRN: [id number] DOB: 16-Apr-1946 Today's Date: 02/21/2023  History of Present Illness  77 yo male presents to [facility] on 8/15 with dizziness and fall, + LOC and sustained skin tears. 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  Clinical Impression   Pt presents with generalized weakness, impaired balance with history of falls, impaired activity tolerance. Pt to benefit from acute PT to address deficits. Pt ambulated 20 ft with RW and close guard for safety, no physical assist to perform mobility this date. Pt reports having limited tolerance at baseline, but feels weaker given falls. PT to progress mobility as tolerated, and will continue to follow acutely.              SpO2 87-98% on 4LO2 (pt baseline), DOE 2/4       HR 109-132 bpm throughout session  If plan is discharge home, recommend the following: A little help with walking and/or transfers;A little help with bathing/dressing/bathroom   Can travel by private vehicle        Equipment Recommendations None recommended by PT  Recommendations for Other Services       Functional Status Assessment Patient has had a recent decline in their functional status and demonstrates the ability to make significant improvements in function in a reasonable and predictable amount of time.     Precautions / Restrictions Precautions Precautions: Fall Restrictions Weight Bearing Restrictions: No      Mobility  Bed Mobility Overal bed mobility: Needs Assistance Bed Mobility: Supine to Sit     Supine to sit: Supervision, HOB elevated     General bed mobility comments: use of rails and HOB elevation    Transfers Overall transfer level: Needs assistance Equipment used: Rolling walker (2 wheels) Transfers: Sit to/from Stand Sit to Stand: Contact guard assist           General transfer comment: for safety, slow to rise     Ambulation/Gait Ambulation/Gait assistance: Contact guard assist Gait Distance (Feet): 20 Feet Assistive device: Rolling walker (2 wheels) Gait Pattern/deviations: Step-through pattern, Decreased stride length, Trunk flexed Gait velocity: decr     General Gait Details: cues for placement in RW, room navigation. slowed steps, DOE 2/4 with SpO2 87-98% on 4LO2.  Stairs            Wheelchair Mobility     Tilt Bed    Modified Rankin (Stroke Patients Only)       Balance Overall balance assessment: Needs assistance, History of Falls Sitting-balance support: No upper extremity supported, Feet supported Sitting balance-Leahy Scale: Fair     Standing balance support: Bilateral upper extremity supported, Reliant on assistive device for balance, During functional activity Standing balance-Leahy Scale: Poor Standing balance comment: reliant on external assist                             Pertinent Vitals/Pain Pain Assessment Pain Assessment: No/denies pain    Home Living Family/patient expects to be discharged to:: Private residence Living Arrangements: Spouse/significant other Available Help at Discharge: Family;Available 24 hours/day Type of Home: House Home Access: Stairs to enter;Ramped entrance   Entrance Stairs-Number of Steps: 1   Home Layout: Two level;Able to live on main level with bedroom/bathroom Home Equipment: Rolling Walker (2 wheels);Rollator (4 wheels);BSC/3in1;Hospital bed;Other (comment);Grab bars - toilet;Grab bars - tub/shower;Shower seat - built in;Wheelchair - manual  Prior Function Prior Level of Function : Needs assist             Mobility Comments: pt has a ramp to enter home, uses RW for mobility in home. Frequent falls PTA       Extremity/Trunk Assessment   Upper Extremity Assessment Upper Extremity Assessment: Generalized weakness    Lower Extremity Assessment Lower Extremity Assessment: Generalized weakness     Cervical / Trunk Assessment Cervical / Trunk Assessment: Kyphotic  Communication   Communication Communication: No apparent difficulties  Cognition Arousal: Alert Behavior During Therapy: WFL for tasks assessed/performed Overall Cognitive Status: Within Functional Limits for tasks assessed                                          General Comments General comments (skin integrity, edema, etc.): skin tears on UEs and LEs from fall    Exercises     Assessment/Plan    PT Assessment Patient needs continued PT services  PT Problem List Decreased strength;Decreased balance;Decreased activity tolerance;Decreased mobility;Cardiopulmonary status limiting activity;Decreased skin integrity       PT Treatment Interventions DME instruction;Therapeutic activities;Gait training;Therapeutic exercise;Patient/family education;Balance training;Stair training;Functional mobility training;Neuromuscular re-education    PT Goals (Current goals can be found in the Care Plan section)  Acute Rehab PT Goals Patient Stated Goal: home PT Goal Formulation: With patient Time For Goal Achievement: 03/07/23 Potential to Achieve Goals: Good    Frequency Min 1X/week     Co-evaluation               AM-PAC PT "6 Clicks" Mobility  Outcome Measure Help needed turning from your back to your side while in a flat bed without using bedrails?: A Little Help needed moving from lying on your back to sitting on the side of a flat bed without using bedrails?: A Little Help needed moving to and from a bed to a chair (including a wheelchair)?: A Little Help needed standing up from a chair using your arms (e.g., wheelchair or bedside chair)?: A Little Help needed to walk in hospital room?: A Little Help needed climbing 3-5 steps with a railing? : A Little 6 Click Score: 18    End of Session   Activity Tolerance: Patient tolerated treatment well Patient left: in chair;with call bell/phone  within reach;with chair alarm set;with family/visitor present Nurse Communication: Mobility status PT Visit Diagnosis: Other abnormalities of gait and mobility (R26.89)    Time: 0932-6712; 4580-9983  PT Time Calculation (min) (ACUTE ONLY): 33 min   Charges:   PT Evaluation $PT Eval Low Complexity: 1 Low PT Treatments $Therapeutic Activity: 8-22 mins PT General Charges $$ ACUTE PT VISIT: 1 Visit         Marye Round, PT DPT Acute Rehabilitation Services Secure Chat Preferred  Office [phone]   Rapheal Masso Sheliah Plane 02/21/2023, 9:36 AM

## 2023-02-21 NOTE — Plan of Care (Signed)
  Problem: Education: Goal: Knowledge of condition and prescribed therapy will improve Outcome: Progressing   Problem: Cardiac: Goal: Will achieve and/or maintain adequate cardiac output Outcome: Progressing   Problem: Physical Regulation: Goal: Complications related to the disease process, condition or treatment will be avoided or minimized Outcome: Progressing   

## 2023-02-21 NOTE — Progress Notes (Signed)
   02/21/23 0900  Orthostatic Lying   BP- Lying 115/81  Pulse- Lying 109  Orthostatic Sitting  BP- Sitting 108/64  Pulse- Sitting 111  Orthostatic Standing at 0 minutes  BP- Standing at 0 minutes 107/61  Pulse- Standing at 0 minutes 125  Orthostatic Standing at 3 minutes  BP- Standing at 3 minutes 106/66  Pulse- Standing at 3 minutes 132   Pt with no reports of dizziness, lightheadedness.   Marye Round, PT DPT Acute Rehabilitation Services Secure Chat Preferred  Office [phone]

## 2023-02-21 NOTE — Progress Notes (Signed)
PROGRESS NOTE    Francisco [name] II  YNW:[id number] DOB: 18-Mar-1946 DOA: 02/20/2023 PCP: Shelva Majestic, MD  Outpatient Specialists:     Brief Narrative:  Patient is a 77 year old male with past medical history significant for recurrent falls, COPD on oxygen, PE on Eliquis and SCLC.  Patient was admitted with possible syncope.  No associated seizure activity.  Patient has been on Lasix for chronic bilateral lower extremity edema.  02/21/2023: Patient seen alongside patient's wife.  No complaints.  Workup is in progress.  Urinalysis reveals mild ketone, with v1.012.  Troponin came back negative.  Hemoglobin is 9.8 g/dL (down from 65.7 g/dL in February 8469.  Patient has history of GI bleed).  Echocardiogram result is pending.  EKG reveals sinus tachycardia (110 bpm).  Will check orthostasis.  Continue IV fluid.  Patient may need cardiac monitoring on discharge.   Assessment & Plan:   Principal Problem:   Syncope and collapse   Syncope: -Continue IV fluid hydration -Orthostatic vital signs.  -Follow echo report.  - Check stool guaiac, trops, TSH, lipids - Local wound care - Fall precautions, PT consult - Cardio consult -Likely heart monitor on discharge.   Tachycardia: -Sinus tachycardia. -Continue adequate hydration. -Continue to evaluate. -Normal TSH.   O2 dependent COPD, acute on chronic - PO azithro, Medrol dosepak - Nebulizers, O2 therapy and expectorants as needed.  -Follow sputum culture - Continuous pulse oximetry - Continue Spiriva, Daliresp, Symbicort,   H/O PULMONARY EMBOLISM:  - Continue Eliquis   History of GERD - Continue Protonix   History of HLD - Continue rosuvastatin   DVT prophylaxis: Eliquis Code Status: Full code Family Communication: Wife at bedside Disposition Plan: Home eventually.   Consultants:  Cardiology input.  Procedures:  Echo report.  Antimicrobials:  Azithromycin.   Subjective: -Admitted with syncope. -No new  symptoms.  Objective: Vitals:   02/21/23 0216 02/21/23 0221 02/21/23 0420 02/21/23 0747  BP:  108/75 116/78 132/80  Pulse:  (!) 191 (!) 115 (!) 115  Resp:  (!) 23 15 18   Temp:  97.7 F (36.5 C) 97.7 F (36.5 C) 97.8 F (36.6 C)  TempSrc:  Oral Oral Oral  SpO2:  97% 96% 96%  Weight: 75.7 kg  75.7 kg   Height: 6' (1.829 m)       Intake/Output Summary (Last 24 hours) at 02/21/2023 1117 Last data filed at 02/21/2023 0800 Gross per 24 hour  Intake 386.8 ml  Output 150 ml  Net 236.8 ml   Filed Weights   02/20/23 1523 02/21/23 0216 02/21/23 0420  Weight: 75.3 kg 75.7 kg 75.7 kg    Examination:  General exam: Appears calm and comfortable  Respiratory system: Decreased air entry. Cardiovascular system: S1 & S2 heard Gastrointestinal system: Abdomen is soft and non tender. Central nervous system: Alert and oriented.  Patient moves all extremities. Extremities: Bilateral lower leg edema.  Data Reviewed: I have personally reviewed following labs and imaging studies  CBC: Recent Labs  Lab 02/20/23 1617 02/21/23 0835  WBC 10.4 9.7  NEUTROABS 9.1*  --   HGB 10.5* 9.8*  HCT 33.8* 31.1*  MCV 96.8 96.6  PLT 210 173   Basic Metabolic Panel: Recent Labs  Lab 02/20/23 1617 02/21/23 0835  NA 141 137  K 4.1 4.4  CL 92* 92*  CO2 34* 33*  GLUCOSE 104* 140*  BUN 7* 15  CREATININE 1.07 1.20  CALCIUM 9.4 8.7*  MG  --  1.8   GFR: Estimated Creatinine Clearance:  57 mL/min (by C-G formula based on SCr of 1.2 mg/dL). Liver Function Tests: Recent Labs  Lab 02/20/23 1617  AST 26  ALT 20  ALKPHOS 49  BILITOT 0.3  PROT 6.3*  ALBUMIN 3.4*   No results for input(s): "LIPASE", "AMYLASE" in the last 168 hours. No results for input(s): "AMMONIA" in the last 168 hours. Coagulation Profile: No results for input(s): "INR", "PROTIME" in the last 168 hours. Cardiac Enzymes: No results for input(s): "CKTOTAL", "CKMB", "CKMBINDEX", "TROPONINI" in the last 168 hours. BNP (last 3  results) No results for input(s): "PROBNP" in the last 8760 hours. HbA1C: No results for input(s): "HGBA1C" in the last 72 hours. CBG: Recent Labs  Lab 02/21/23 0444  GLUCAP 95   Lipid Profile: Recent Labs    02/21/23 0835  CHOL 168  HDL 83  LDLCALC 73  TRIG 62  CHOLHDL 2.0   Thyroid Function Tests: Recent Labs    02/21/23 0835  TSH 1.055   Anemia Panel: No results for input(s): "VITAMINB12", "FOLATE", "FERRITIN", "TIBC", "IRON", "RETICCTPCT" in the last 72 hours. Urine analysis:    Component Value Date/Time   COLORURINE YELLOW 02/21/2023 0710   APPEARANCEUR CLEAR 02/21/2023 0710   LABSPEC 1.012 02/21/2023 0710   PHURINE 7.0 02/21/2023 0710   GLUCOSEU NEGATIVE 02/21/2023 0710   GLUCOSEU NEGATIVE 06/25/2017 0831   HGBUR NEGATIVE 02/21/2023 0710   HGBUR trace-lysed 02/12/2010 0819   BILIRUBINUR NEGATIVE 02/21/2023 0710   BILIRUBINUR Negative 12/25/2017 0852   KETONESUR 5 (A) 02/21/2023 0710   PROTEINUR NEGATIVE 02/21/2023 0710   UROBILINOGEN 0.2 12/25/2017 0852   UROBILINOGEN 0.2 06/25/2017 0831   NITRITE NEGATIVE 02/21/2023 0710   LEUKOCYTESUR NEGATIVE 02/21/2023 0710   Sepsis Labs: @LABRCNTIP (procalcitonin:4,lacticidven:4)  ) Recent Results (from the past 240 hour(s))  SARS Coronavirus 2 by RT PCR (hospital order, performed in [facility] hospital lab) *cepheid single result test* Anterior Nasal Swab     Status: None   Collection Time: 02/21/23  1:10 AM   Specimen: Anterior Nasal Swab  Result Value Ref Range Status   SARS Coronavirus 2 by RT PCR NEGATIVE NEGATIVE Final    Comment: Performed at [facility], [address] [place], [place] [address]         Radiology Studies: DG Chest Portable 1 View  Result Date: 02/20/2023 CLINICAL DATA:  Fall, dizziness EXAM: PORTABLE CHEST 1 VIEW COMPARISON:  CT chest dated 12/24/2022 FINDINGS: Radiation changes in the right mid lung with adjacent surgical clips. Biapical pleural-parenchymal scarring.  Right basilar scarring. No pleural effusion or pneumothorax. The heart is normal in size.  Thoracic aortic atherosclerosis. IMPRESSION: No evidence of acute cardiopulmonary disease. Radiation changes in the right mid lung. Electronically Signed   By: Charline Bills M.D.   On: 02/20/2023 17:34        Scheduled Meds:  apixaban  2.5 mg Oral BID   azithromycin  250 mg Oral Q M,W,F   calcium-vitamin D  1 tablet Oral Q breakfast   ferrous sulfate  325 mg Oral QODAY   fluticasone  2 spray Each Nare Daily   folic acid  1 mg Oral Daily   liver oil-zinc oxide   Topical BID   methylPREDNISolone  4 mg Oral PC lunch   methylPREDNISolone  4 mg Oral PC supper   [START ON 02/22/2023] methylPREDNISolone  4 mg Oral 3 x daily with food   [START ON 02/23/2023] methylPREDNISolone  4 mg Oral 4X daily taper   methylPREDNISolone  8 mg  Oral Nightly   [START ON 02/22/2023] methylPREDNISolone  8 mg Oral Nightly   mometasone-formoterol  2 puff Inhalation BID   multivitamin with minerals  1 tablet Oral Daily   pantoprazole  20 mg Oral Daily   roflumilast  500 mcg Oral Daily   rosuvastatin  20 mg Oral Daily   sodium chloride flush  3 mL Intravenous Q12H   umeclidinium bromide  1 puff Inhalation Daily   Continuous Infusions:  sodium chloride 75 mL/hr at 02/21/23 0800     LOS: 0 days    Time spent: 35 minutes.    Berton Mount, MD  [facility] Pager #: [phone] 7PM-7AM contact night coverage as above

## 2023-02-21 NOTE — Progress Notes (Signed)
Echocardiogram 2D Echocardiogram has been performed.  Enrico Eaddy N Tenasia Aull,RDCS 02/21/2023, 12:38 PM

## 2023-02-21 NOTE — Consult Note (Signed)
WOC Nurse Consult Note: Reason for Consult:Left lateral LE skin tear. Other lesser tears on UEs secondary to fall at home. Healing Stage 2 PI to right buttock. Consult is performed remotely after review of the EMR, including photos. Wound type:trauma, pressure Pressure Injury POA: Yes Measurement:Bedside RN to measure skin tear to left lateral LE with application of next dressing and document measurements on Nursing Flow Sheet Wound bed:per admitting MD note, red, moist Drainage (amount, consistency, odor) see above, small serosanguinous Periwound:see above, edematous, non pitting Dressing procedure/placement/frequency:Care of this lesion and skin tears is guided by the standing skin care order set and I have provided those for Nursing today specifically, for a daily NS cleanse followed by pat dry and cover with folded layer of antimicrobial nonadherent gauze (xeroform). This is to be covered with dry gauze, an ABD pad, and secured with a few turns of Kerlix roll gauze/paper tape. A sacral silicone foam is to be placed to the sacrum and heels floated for PI prevention in those areas. A healing Stage 2 PI to the right buttock is to be treated with twice daily cleansing and topical  application of Desitin ointment.  WOC nursing team will not follow, but will remain available to this patient, the nursing and medical teams.  Please re-consult if needed.  Thank you for inviting [place] to participate in this patient's Plan of Care.  Ladona Mow, MSN, RN, CNS, GNP, Leda Min, [employer], [employer] phone:  [phone]

## 2023-02-21 NOTE — Plan of Care (Signed)
  Problem: Education: Goal: Knowledge of condition and prescribed therapy will improve Outcome: Progressing   Problem: Cardiac: Goal: Will achieve and/or maintain adequate cardiac output Outcome: Progressing   Problem: Physical Regulation: Goal: Complications related to the disease process, condition or treatment will be avoided or minimized Outcome: Progressing   Problem: Education: Goal: Knowledge of General Education information will improve Description: Including pain rating scale, medication(s)/side effects and non-pharmacologic comfort measures Outcome: Progressing   Problem: Health Behavior/Discharge Planning: Goal: Ability to manage health-related needs will improve Outcome: Progressing   Problem: Clinical Measurements: Goal: Ability to maintain clinical measurements within normal limits will improve Outcome: Progressing Goal: Will remain free from infection Outcome: Progressing Goal: Diagnostic test results will improve Outcome: Progressing Goal: Respiratory complications will improve Outcome: Progressing Goal: Cardiovascular complication will be avoided Outcome: Progressing   Problem: Activity: Goal: Risk for activity intolerance will decrease Outcome: Progressing   Problem: Nutrition: Goal: Adequate nutrition will be maintained Outcome: Progressing   Problem: Coping: Goal: Level of anxiety will decrease Outcome: Progressing   Problem: Elimination: Goal: Will not experience complications related to bowel motility Outcome: Progressing  Using the Bedside commode Goal: Will not experience complications related to urinary retention Outcome: Progressing   Problem: Pain Managment: Goal: General experience of comfort will improve Outcome: Progressing   Problem: Safety: Goal: Ability to remain free from injury will improve Outcome: Progressing   Problem: Skin Integrity: Goal: Risk for impaired skin integrity will decrease Outcome: Progressing

## 2023-02-21 NOTE — TOC Initial Note (Addendum)
Transition of Care [facility]) - Initial/Assessment Note    Patient Details  Name: Francisco [name] MRN: [id number] Date of Birth: [birth year]  Transition of [facility]) CM/SW Contact:    Leone Haven, RN Phone Number: 02/21/2023, 4:15 PM  Clinical Narrative:                 From home with spouse, has PCP and insurance on file, states has HHRN,HHPT with [facility] and would like to continue with them, NCM confirmed with Morrie Sheldon soc will begin 24 to 48 hrs post dc.  He also has home oxygen with adapt 4 liters, walker, shower chair, bsc, rollator, w/chair,  states wife will transport him  home at dc and family is support system, [facility] [address] for [facility], for long term use [facility].  Pta walks with walker.  HH orders are in.  Expected Discharge Plan: Home w Home Health Services Barriers to Discharge: Continued Medical Work up   Patient Goals and CMS Choice Patient states their goals for this hospitalization and ongoing recovery are:: return home CMS Medicare.gov Compare Post Acute Care list provided to:: Patient Choice offered to / list presented to : Patient      Expected Discharge Plan and Services In-house Referral: NA Discharge Planning Services: CM Consult Post Acute Care Choice: Home Health, Resumption of Svcs/PTA Provider Living arrangements for the past 2 months: Single Family Home                 DME Arranged: N/A DME Agency: NA       HH Arranged: RN, PT HH Agency: [facility] Date HH Agency Contacted: 02/21/23 Time HH Agency Contacted: 1614 Representative spoke with at [facility] Agency: Morrie Sheldon  Prior Living Arrangements/Services Living arrangements for the past 2 months: Single Family Home Lives with:: Spouse Patient language and need for interpreter reviewed:: Yes Do you feel safe going back to the place where you live?: Yes      Need for Family Participation in Patient Care: Yes (Comment) Care  giver support system in place?: Yes (comment) Current home services: DME, Home PT, Home RN (home oxygen 4 liters Adapt, walker, shower chair, bsc, rollator, w/chair) Criminal Activity/Legal Involvement Pertinent to Current Situation/Hospitalization: No - Comment as needed  Activities of Daily Living      Permission Sought/Granted Permission sought to share information with : Case Manager       Permission granted to share info w AGENCY: [facility]        Emotional Assessment Appearance:: Appears stated age Attitude/Demeanor/Rapport: Engaged Affect (typically observed): Appropriate Orientation: : Oriented to Self, Oriented to Place, Oriented to  Time, Oriented to Situation Alcohol / Substance Use: Not Applicable Psych Involvement: No (comment)  Admission diagnosis:  Syncope and collapse [R55] Patient Active Problem List   Diagnosis Date Noted   Syncope and collapse 02/20/2023   Lumbar compression fracture (HCC) 09/11/2022   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   Gastritis and gastroduodenitis 08/12/2022   AVM (arteriovenous malformation) of duodenum, acquired 08/12/2022   Melena 08/11/2022   Chronic systolic congestive heart failure (HCC) 12/10/2021   AVM (arteriovenous malformation) of small bowel, 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/21/2020   Iron deficiency anemia due to chronic blood loss 08/23/2020   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   History of upper GI bleeding 04/30/2020   Hiatal hernia 04/30/2020   Chronic anticoagulation 04/30/2020   Anemia 04/30/2020   Malignant neoplasm of lower lobe of  right lung (HCC)    Acute GI bleeding 02/18/2020   PAD (peripheral artery disease) (HCC) 12/11/2018   Pre-operative respiratory examination 12/10/2018   Stage 4 very severe COPD by GOLD classification (HCC) 11/27/2018   DNR (do not resuscitate) 04/21/2018   Diastolic dysfunction 04/21/2018   Chronic pulmonary embolism (HCC) 04/07/2018   Essential tremor 03/31/2018   BPPV (benign  paroxysmal positional vertigo), right 02/10/2018   Osteoporosis 11/19/2017   Thoracic compression fracture (HCC) 11/02/2017   Essential hypertension 11/02/2017   Venous stasis dermatitis of both lower extremities 11/02/2017   Chronic respiratory failure with hypoxia (HCC) 09/23/2017   BPH associated with nocturia 06/25/2017   Leukocytosis 12/19/2016   Hyperglycemia 06/12/2016   Hyperlipidemia 07/22/2014   Onychomycosis 07/22/2014   Left ankle swelling 07/22/2014   History of skin cancer 05/17/2014   Chronic rhinitis 10/28/2012   Pulmonary nodule 09/23/2011   Former smoker 08/23/2008   History of colonic polyps 08/23/2008   PCP:  Shelva Majestic, MD Pharmacy:   [phone] [place] [place] - [address] [place] [address] Phone: [phone] Fax: [phone]  [facility] - [place], [place] - [address] [place] [address] Phone: [phone] Fax: [phone]  [facility] [address], [place] - [address] [place] [address] Phone: [phone] Fax: [phone]     Social Determinants of Health (SDOH) Social History: SDOH Screenings   Food Insecurity: No Food Insecurity (02/03/2023)  Housing: Low Risk  (02/03/2023)  Transportation Needs: No Transportation Needs (02/03/2023)  Alcohol Screen: Low Risk  (02/03/2023)  Depression (PHQ2-9): Low Risk  (02/05/2023)  Financial Resource Strain: Low Risk  (02/03/2023)  Physical Activity: Unknown (02/03/2023)  Social Connections: Moderately Isolated (02/03/2023)  Stress: No Stress Concern Present (02/03/2023)  Tobacco Use: Medium Risk (02/20/2023)   SDOH Interventions:     Readmission Risk Interventions    08/13/2022    1:46 PM  Readmission Risk Prevention Plan  Transportation Screening Complete  PCP or Specialist Appt within 5-7 Days Complete  Home Care Screening Complete   Medication Review (RN CM) Complete

## 2023-02-22 DIAGNOSIS — R55 Syncope and collapse: Secondary | ICD-10-CM | POA: Diagnosis not present

## 2023-02-22 LAB — CBC WITH DIFFERENTIAL/PLATELET
Abs Immature Granulocytes: 0.06 10*3/uL (ref 0.00–0.07)
Basophils Absolute: 0 10*3/uL (ref 0.0–0.1)
Basophils Relative: 0 %
Eosinophils Absolute: 0 10*3/uL (ref 0.0–0.5)
Eosinophils Relative: 0 %
HCT: 29 % — ABNORMAL LOW (ref 39.0–52.0)
Hemoglobin: 9.1 g/dL — ABNORMAL LOW (ref 13.0–17.0)
Immature Granulocytes: 1 %
Lymphocytes Relative: 5 %
Lymphs Abs: 0.4 10*3/uL — ABNORMAL LOW (ref 0.7–4.0)
MCH: 30.2 pg (ref 26.0–34.0)
MCHC: 31.4 g/dL (ref 30.0–36.0)
MCV: 96.3 fL (ref 80.0–100.0)
Monocytes Absolute: 0.3 10*3/uL (ref 0.1–1.0)
Monocytes Relative: 4 %
Neutro Abs: 6.6 10*3/uL (ref 1.7–7.7)
Neutrophils Relative %: 90 %
Platelets: 173 10*3/uL (ref 150–400)
RBC: 3.01 MIL/uL — ABNORMAL LOW (ref 4.22–5.81)
RDW: 13.1 % (ref 11.5–15.5)
WBC: 7.4 10*3/uL (ref 4.0–10.5)
nRBC: 0 % (ref 0.0–0.2)

## 2023-02-22 LAB — RENAL FUNCTION PANEL
Albumin: 2.9 g/dL — ABNORMAL LOW (ref 3.5–5.0)
Anion gap: 11 (ref 5–15)
BUN: 11 mg/dL (ref 8–23)
CO2: 31 mmol/L (ref 22–32)
Calcium: 8.5 mg/dL — ABNORMAL LOW (ref 8.9–10.3)
Chloride: 98 mmol/L (ref 98–111)
Creatinine, Ser: 0.94 mg/dL (ref 0.61–1.24)
GFR, Estimated: >60 mL/min (ref >60)
Glucose, Bld: 119 mg/dL — ABNORMAL HIGH (ref 70–99)
Phosphorus: 3.6 mg/dL (ref 2.5–4.6)
Potassium: 4.2 mmol/L (ref 3.5–5.1)
Sodium: 140 mmol/L (ref 135–145)

## 2023-02-22 LAB — CBC
HCT: 30.4 % — ABNORMAL LOW (ref 39.0–52.0)
Hemoglobin: 9.6 g/dL — ABNORMAL LOW (ref 13.0–17.0)
MCH: 31.5 pg (ref 26.0–34.0)
MCHC: 31.6 g/dL (ref 30.0–36.0)
MCV: 99.7 fL (ref 80.0–100.0)
Platelets: 201 10*3/uL (ref 150–400)
RBC: 3.05 MIL/uL — ABNORMAL LOW (ref 4.22–5.81)
RDW: 13.1 % (ref 11.5–15.5)
WBC: 10.6 10*3/uL — ABNORMAL HIGH (ref 4.0–10.5)
nRBC: 0 % (ref 0.0–0.2)

## 2023-02-22 LAB — MAGNESIUM: Magnesium: 1.9 mg/dL (ref 1.7–2.4)

## 2023-02-22 LAB — TYPE AND SCREEN
ABO/RH(D): A POS
Antibody Screen: NEGATIVE

## 2023-02-22 LAB — GLUCOSE, CAPILLARY: Glucose-Capillary: 121 mg/dL — ABNORMAL HIGH (ref 70–99)

## 2023-02-22 MED ORDER — CEFDINIR 300 MG PO CAPS: 600.0000 mg | ORAL_CAPSULE | Freq: Every day | ORAL | 0 refills | Status: DC

## 2023-02-22 NOTE — Progress Notes (Signed)
Pt discharged to home in stable condition. All discharge instructions reviewed with and given to pt and wife. Pt transported off unit via wheelchair with staff x1 at chairside to private vehicle to transport home.

## 2023-02-22 NOTE — TOC Transition Note (Signed)
Transition of Care [facility]) - CM/SW Discharge Note   Patient Details  Name: Francisco [name] MRN: [id number] Date of Birth: [birth year]  Transition of Care [facility]) CM/SW Contact:  Lawerance Sabal, RN Phone Number: 02/22/2023, 2:51 PM   Clinical Narrative:     Lavone Neri w [facility] that patient will DC to home today   Final next level of care: Home w Home Health Services Barriers to Discharge: Continued Medical Work up   Patient Goals and CMS Choice CMS Medicare.gov Compare Post Acute Care list provided to:: Patient Choice offered to / list presented to : Patient  Discharge Placement                         Discharge Plan and Services Additional resources added to the After Visit Summary for   In-house Referral: NA Discharge Planning Services: CM Consult Post Acute Care Choice: Home Health, Resumption of Svcs/PTA Provider          DME Arranged: N/A DME Agency: NA       HH Arranged: RN, PT HH Agency: [facility] Date HH Agency Contacted: 02/21/23 Time HH Agency Contacted: 1614 Representative spoke with at [facility] Agency: Morrie Sheldon  Social Determinants of Health (SDOH) Interventions SDOH Screenings   Food Insecurity: No Food Insecurity (02/03/2023)  Housing: Low Risk  (02/03/2023)  Transportation Needs: No Transportation Needs (02/03/2023)  Alcohol Screen: Low Risk  (02/03/2023)  Depression (PHQ2-9): Low Risk  (02/05/2023)  Financial Resource Strain: Low Risk  (02/03/2023)  Physical Activity: Unknown (02/03/2023)  Social Connections: Moderately Isolated (02/03/2023)  Stress: No Stress Concern Present (02/03/2023)  Tobacco Use: Medium Risk (02/20/2023)     Readmission Risk Interventions    08/13/2022    1:46 PM  Readmission Risk Prevention Plan  Transportation Screening Complete  PCP or Specialist Appt within 5-7 Days Complete  Home Care Screening Complete  Medication Review (RN CM) Complete

## 2023-02-24 ENCOUNTER — Telehealth: Payer: Self-pay

## 2023-02-24 ENCOUNTER — Other Ambulatory Visit: Payer: Self-pay

## 2023-02-24 DIAGNOSIS — L89322 Pressure ulcer of left buttock, stage 2: Secondary | ICD-10-CM

## 2023-02-24 LAB — CULTURE, RESPIRATORY W GRAM STAIN

## 2023-02-24 NOTE — Addendum Note (Signed)
Addended by: Shelva Majestic on: 02/24/2023 05:48 PM   Modules accepted: Orders

## 2023-02-24 NOTE — Transitions of Care (Post Inpatient/ED Visit) (Signed)
02/24/2023  Name: Francisco [name] MRN: [id number] DOB: Sep 13, 1945  Today's TOC FU Call Status: Today's TOC FU Call Status:: Successful TOC FU Call Completed TOC FU Call Complete Date: 02/24/23  Transition Care Management Follow-up Telephone Call Date of Discharge: 02/22/23 Discharge Facility: [facility]) Type of Discharge: Inpatient Admission Primary Inpatient Discharge Diagnosis:: Syncope and collapse How have you been since you were released from the hospital?: Better Any questions or concerns?: No  Items Reviewed: Did you receive and understand the discharge instructions provided?: Yes Medications obtained,verified, and reconciled?: Yes (Medications Reviewed) Any new allergies since your discharge?: No Dietary orders reviewed?: Yes Do you have support at home?: Yes  Medications Reviewed Today: Medications Reviewed Today     Reviewed by Merleen Nicely, LPN (Licensed Practical Nurse) on 02/24/23 at 1328  Med List Status: <None>   Medication Order Taking? Sig Documenting Provider Last Dose Status Informant  acetaminophen (TYLENOL) 500 MG tablet [id number] Yes Take 500 mg by mouth every 4 (four) hours as needed for moderate pain, fever, headache or mild pain. [provider] Taking Active Self, Pharmacy Records  albuterol (PROVENTIL) (2.5 MG/3ML) 0.083% nebulizer solution [id number] Yes Take 3 mLs (2.5 mg total) by nebulization every 6 (six) hours as needed for wheezing or shortness of breath. Elgergawy, Leana Roe, MD Taking Active Self, Pharmacy Records  azithromycin [facility]) 250 MG tablet [id number] Yes Take 250 mg by mouth See admin instructions. Continuous M,W,F [provider] Taking Active Self, Pharmacy Records  Calcium Carbonate-Vitamin D (CALCIUM 600+D PO) [id number] Yes Take 2 tablets by mouth daily. [provider] Taking Active Self, Pharmacy Records  cefdinir (OMNICEF) 300 MG capsule [id number] Yes Take 2 capsules (600 mg total) by mouth  daily. Charolotte Eke, MD Taking Active   docusate sodium (COLACE) 100 MG capsule [id number] Yes Take 2 capsules (200 mg total) by mouth 2 (two) times daily.  Patient taking differently: Take 200 mg by mouth 2 (two) times daily. Patient uses PRN   Elgergawy, Leana Roe, MD Taking Active Self, Pharmacy Records  ELIQUIS 2.5 MG TABS tablet [id number] Yes TAKE 1 TABLET BY MOUTH TWICE  DAILY  Patient taking differently: Take 2.5 mg by mouth 2 (two) times daily.   Shelva Majestic, MD Taking Active Pharmacy Records, Self  Ferrous Sulfate (IRON) 325 (65 Fe) MG TABS [id number] Yes Take 325 mg by mouth every evening. [provider] Taking Active Self, Pharmacy Records  fluticasone [facility]) 50 MCG/ACT nasal spray [id number] Yes USE 2 SPRAYS IN BOTH  NOSTRILS DAILY  Patient taking differently: Place 2 sprays into both nostrils daily.   Shelva Majestic, MD Taking Active Pharmacy Records, Self  folic acid (FOLVITE) 1 MG tablet [id number] Yes Take 1 tablet (1 mg total) by mouth daily. Edsel Petrin, DO Taking Active Pharmacy Records, Self  furosemide (LASIX) 20 MG tablet [id number] Yes TAKE 1 TABLET BY MOUTH DAILY AS  NEEDED FOR FLUID OR EDEMA  Patient taking differently: Take 20 mg by mouth daily.   Shelva Majestic, MD Taking Active Self, Pharmacy Records  hydrOXYzine (VISTARIL) 25 MG capsule [id number] Yes Take 1 capsule (25 mg total) by mouth every 8 (eight) hours as needed. Lula Olszewski, MD Taking Active Self, Pharmacy Records  Multiple Vitamin (MULTIVITAMIN WITH MINERALS) TABS tablet [id number] Yes Take 1 tablet by mouth daily. Edsel Petrin, DO Taking Active Self, Pharmacy Records  mupirocin cream (BACTROBAN) 2 % [id number] Yes Apply 1 Application topically 2 (two) times daily. Tana Conch  O, MD Taking Active Self, Pharmacy Records  oxyCODONE (OXY IR/ROXICODONE) 5 MG immediate release tablet [id number] Yes Take 5 mg by mouth every 4 (four) hours as needed for severe pain. [provider] Taking Active Self, Pharmacy Records  OXYGEN [id number] Yes Inhale 4-5 L into the lungs continuous.  [provider] Taking Active Pharmacy Records, Self  pantoprazole (PROTONIX) 20 MG tablet [id number] Yes TAKE 1 TABLET BY MOUTH DAILY Durene Cal Aldine Contes, MD Taking Active Self, Pharmacy Records  Polyethyl Glycol-Propyl Glycol (SYSTANE) 0.4-0.3 % SOLN [id number] Yes Place 1 drop into both eyes every other day. [provider] Taking Active Self, Pharmacy Records  predniSONE (DELTASONE) 10 MG tablet [id number] Yes TAKE 1 TABLET BY MOUTH DAILY  WITH Arvid Right, MD Taking Active   predniSONE (DELTASONE) 10 MG tablet [id number] Yes Take 10 mg by mouth daily with breakfast. [provider] Taking Active Self, Pharmacy Records  predniSONE (DELTASONE) 50 MG tablet [id number] No Take one tab po 13 hours prior, then one tab 7 hours prior, and then one tab 1 hour prior to CT. Also ask pt to take benadryl 50 mg po 1 hour prior to CT  Patient not taking: Reported on 02/20/2023   Ronny Bacon, PA-C Not Taking Active Self, Pharmacy Records  roflumilast (DALIRESP) 500 MCG TABS tablet [id number] Yes TAKE 1 TABLET BY MOUTH DAILY Kalman Shan, MD Taking Active   rosuvastatin (CRESTOR) 20 MG tablet [id number] Yes Take 1 tablet (20 mg total) by mouth daily.  Patient taking differently: Take 20 mg by mouth every evening.   Shelva Majestic, MD Taking Active Self, Pharmacy Records  SYMBICORT 160-4.5 MCG/ACT inhaler [id number] Yes USE 2 INHALATIONS BY MOUTH TWICE DAILY Shelva Majestic, MD Taking Active Self, Pharmacy Records  Tiotropium Bromide Monohydrate (SPIRIVA RESPIMAT) 2.5 MCG/ACT AERS [id number] Yes USE 2 INHALATIONS BY MOUTH ONCE  DAILY Kalman Shan, MD Taking Active Self, Pharmacy Records  traMADol (ULTRAM) 50 MG tablet [id number] Yes Take 50 mg by mouth every 12 (twelve) hours as needed for severe pain. [provider] Taking Active Self,  Pharmacy Records            Home Care and Equipment/Supplies: Were Home Health Services Ordered?: Yes Name of Home Health Agency:: [facility] Has Agency set up a time to come to your home?: Yes First Home Health Visit Date: 02/25/23 Any new equipment or medical supplies ordered?: No  Functional Questionnaire: Do you need assistance with bathing/showering or dressing?: Yes Do you need assistance with meal preparation?: Yes Do you need assistance with eating?: No Do you have difficulty maintaining continence: No Do you need assistance with getting out of bed/getting out of a chair/moving?: Yes Do you have difficulty managing or taking your medications?: Yes (wife assists)  Follow up appointments reviewed: PCP Follow-up appointment confirmed?: (S) No (pt needs appt by 03-07-23 no available appts- will have front desk call wife to schedule fu appt) MD Provider Line [phone] Given: Yes Follow-up Provider: [facility] Follow-up appointment confirmed?: Yes Date of Specialist follow-up appointment?: 03/14/23 Follow-Up Specialty Provider:: pulmonologist Do you need transportation to your follow-up appointment?: No Do you understand care options if your condition(s) worsen?: Yes-patient verbalized understanding    SIGNATURE  Woodfin Ganja LPN [facility] Nurse Health Advisor Direct Dial [phone]

## 2023-02-24 NOTE — Telephone Encounter (Signed)
Noted thanks °

## 2023-02-25 ENCOUNTER — Telehealth: Payer: Self-pay | Admitting: Family Medicine

## 2023-02-25 NOTE — Telephone Encounter (Signed)
See below, has ov with you on 08/26 for F2F.

## 2023-02-25 NOTE — Telephone Encounter (Signed)
I signed off on the home health that I was given - I have not seen additional papers- I'm happy to provide as he is now scheduled

## 2023-02-25 NOTE — Telephone Encounter (Signed)
Caller was Inetta Fermo, Charity fundraiser from [employer]. States patient was recent in hospital due to fall. States hospital provided them with orders to help with patient's skin tears. States she wanted to know that if they faxed over the orders provided by the hospital, would PCP sign off on them so they can be completed. For additional information, caller can be reached @ [phone].  States she will be faxing these orders to [place] either way just in case.

## 2023-02-26 ENCOUNTER — Telehealth: Payer: Self-pay | Admitting: Family Medicine

## 2023-02-26 NOTE — Telephone Encounter (Signed)
Patient dropped off document Home Health Certificate (Order ID [id number]), to be filled out by provider. Patient requested to send it back via Fax within 5-days. Document is located in providers tray at front office.Please advise.

## 2023-02-27 NOTE — Discharge Summary (Signed)
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 [facility] Course: Patient is a 77 year old male with past medical history significant for recurrent falls, COPD on oxygen, PE on Eliquis and SCLC admitted for syncope, suspected orthostatic. Orthostatic VS was positive, other work up negative. TTE notable for aortic sclerosis, though not well visualized.   Assessment and Plan:  Syncope Patient was in a usual state of health until this afternoon when he walked into his kitchen using his walker, he became unsteady and dizzy.  He called for his wife but by the times she got to him, he had lost consciousness. He awoke nearly immediately and recalled the entire event.  He denies confusion, incontinence, seizure activity. Otherwise there has been no change in status. Patient has been taking medication as prescribed and there has been no recent change in medication or diet. He has a history of recurrent falls. TTE showed aortic sclerosis, no significant events on tele. Infectious work up negative. Hb stable. Ischemia work up negative. Pt was orthostatic so possible orthostatic. Pt was able to ambulate around unit without significant symptoms prior to DC.  -follow up with cardiology as outpatient for possible AS  COPD On Oxygen therapy at home Being treated for recent exacerbation.  -continue home medications including chronic azithromycin -treat with cefdinir x 5 days + steroid burst pack  Hx of sinus tachycardia  Hx of PE -continue eliquis  Anemia Hx of  GIB Baseline Hb 10-11, decreased to 9.1 after fluids given for orthostasis. No evidence of active GIB. -follow up with GI as outpatient      Consultants: None Procedures performed: None Disposition: Home Diet recommendation:  Discharge Diet Orders (From admission, onward)     Start     Ordered   02/22/23 0000  Diet - low sodium heart healthy        02/22/23 1437           Regular diet DISCHARGE MEDICATION: Allergies as of 02/22/2023       Reactions   Tape Other (See Comments)   Skin tears easily.   Augmentin [amoxicillin-pot Clavulanate] Other (See Comments)   Thrush Sore throat Hoarse voice   Cipro [ciprofloxacin Hcl] Nausea And Vomiting   Weakness Fatigue    Gadavist [gadobutrol] Other (See Comments)   Unknown reaction   Gadolinium Rash   Itchy back rash   Levaquin [levofloxacin] Other (See Comments)   Hallucinations    Lipitor [atorvastatin] Rash        Medication List     TAKE these medications    acetaminophen 500 MG tablet Commonly known as: TYLENOL Take 500 mg by mouth every 4 (four) hours as needed for moderate pain, fever, headache or mild pain.   albuterol (2.5 MG/3ML) 0.083% nebulizer solution Commonly known as: PROVENTIL Take 3 mLs (2.5 mg total) by nebulization every 6 (six) hours as needed for wheezing or shortness of breath.   azithromycin 250 MG tablet Commonly known as: ZITHROMAX Take 250 mg by mouth See admin instructions. Continuous M,W,F   CALCIUM 600+D  PO Take 2 tablets by mouth daily.   cefdinir 300 MG capsule Commonly known as: OMNICEF Take 2 capsules (600 mg total) by mouth daily.   docusate sodium 100 MG capsule Commonly known as: COLACE Take 2 capsules (200 mg total) by mouth 2 (two) times daily. What changed: additional instructions   Eliquis 2.5 MG Tabs tablet Generic drug: apixaban TAKE 1 TABLET BY MOUTH TWICE  DAILY What changed: how much to take   fluticasone 50 MCG/ACT nasal spray Commonly known as:  FLONASE USE 2 SPRAYS IN BOTH  NOSTRILS DAILY What changed: See the new instructions.   folic acid 1 MG tablet Commonly known as: FOLVITE Take 1 tablet (1 mg total) by mouth daily.   furosemide 20 MG tablet Commonly known as: LASIX TAKE 1 TABLET BY MOUTH DAILY AS  NEEDED FOR FLUID OR EDEMA What changed: See the new instructions.   hydrOXYzine 25 MG capsule Commonly known as: VISTARIL Take 1 capsule (25 mg total) by mouth every 8 (eight) hours as needed.   Iron 325 (65 Fe) MG Tabs Take 325 mg by mouth every evening.   multivitamin with minerals Tabs tablet Take 1 tablet by mouth daily.   mupirocin cream 2 % Commonly known as: Bactroban Apply 1 Application topically 2 (two) times daily.   oxyCODONE 5 MG immediate release tablet Commonly known as: Oxy IR/ROXICODONE Take 5 mg by mouth every 4 (four) hours as needed for severe pain.   OXYGEN Inhale 4-5 L into the lungs continuous.   pantoprazole 20 MG tablet Commonly known as: PROTONIX TAKE 1 TABLET BY MOUTH DAILY   predniSONE 10 MG tablet Commonly known as: DELTASONE Take 10 mg by mouth daily with breakfast. What changed: Another medication with the same name was added. Make sure you understand how and when to take each.   predniSONE 50 MG tablet Commonly known as: DELTASONE Take one tab po 13 hours prior, then one tab 7 hours prior, and then one tab 1 hour prior to CT. Also ask pt to take benadryl 50 mg po 1 hour prior to CT What changed: Another medication with the same name was added. Make sure you understand how and when to take each.   predniSONE 10 MG tablet Commonly known as: DELTASONE TAKE 1 TABLET BY MOUTH DAILY  WITH BREAKFAST What changed: You were already taking a medication with the same name, and this prescription was added. Make sure you understand how and when to take each.   roflumilast 500 MCG Tabs tablet Commonly known as: DALIRESP TAKE 1 TABLET BY MOUTH DAILY   rosuvastatin 20 MG  tablet Commonly known as: CRESTOR Take 1 tablet (20 mg total) by mouth daily. What changed: when to take this   Spiriva Respimat 2.5 MCG/ACT Aers Generic drug: Tiotropium Bromide Monohydrate USE 2 INHALATIONS BY MOUTH ONCE  DAILY   Symbicort 160-4.5 MCG/ACT inhaler Generic drug: budesonide-formoterol USE 2 INHALATIONS BY MOUTH TWICE DAILY   Systane 0.4-0.3 % Soln Generic drug: Polyethyl Glycol-Propyl Glycol Place 1 drop into both eyes every other day.   traMADol 50 MG tablet Commonly known as: ULTRAM Take 50 mg by mouth every 12 (twelve) hours as needed for severe pain.               Discharge Care Instructions  (From admission, onward)           Start     Ordered   02/22/23 0000  Discharge wound care:       Comments:  Wound care to Left lateral LE skin tear and other skin tears on arms as needed: Cleanse with NS, pat dry. Cover with folded layer of xeroform gauze [place] # [id number]), top with dry gauze, ABD pad and secure with a few turns of Kerlix roll gauze/paper tape. Change daily.   02/22/23 1437            Discharge Exam: Filed Weights   02/21/23 0216 02/21/23 0420 02/22/23 0256  Weight: 75.7 kg 75.7 kg 77 kg   Physical Exam Vitals and nursing note reviewed.  Constitutional:      General: He is not in acute distress.    Appearance: He is not diaphoretic.  HENT:     Head: Atraumatic.  Eyes:     Pupils: Pupils are equal, round, and reactive to light.  Cardiovascular:     Rate and Rhythm: Normal rate and regular rhythm.     Pulses: Normal pulses.     Heart sounds: No murmur heard. Pulmonary:     Effort: Pulmonary effort is normal. No respiratory distress.     Breath sounds: Normal breath sounds. No rales.  Abdominal:     General: Abdomen is flat. There is no distension.     Palpations: Abdomen is soft.     Tenderness: There is no abdominal tenderness. There is no rebound.  Musculoskeletal:     Right lower leg: No edema.     Left lower leg: No  edema.  Skin:    General: Skin is warm and dry.     Capillary Refill: Capillary refill takes less than 2 seconds.  Neurological:     Mental Status: He is alert and oriented to person, place, and time. Mental status is at baseline.  Psychiatric:        Mood and Affect: Mood normal.      Condition at discharge: good  The results of significant diagnostics from this hospitalization (including imaging, microbiology, ancillary and laboratory) are listed below for reference.   Imaging Studies: ECHOCARDIOGRAM COMPLETE  Result Date: 02/21/2023    ECHOCARDIOGRAM REPORT   Patient Name:   Francisco [name] Date of Exam: 02/21/2023 Medical Rec #:  [id number]         Height:       72.0 in Accession #:    [id number]        Weight:       166.9 lb Date of Birth:  21-Dec-1945         BSA:          1.973 m Patient Age:    77 years          BP:           104/69 mmHg Patient Gender: M                 HR:           112 bpm. Exam Location:  Inpatient Procedure: 2D Echo, Cardiac Doppler, Color Doppler and Intracardiac            Opacification Agent Indications:    Syncope  History:        Patient has prior history of Echocardiogram examinations, most                 recent 08/10/2022. CHF, COPD and PAD; Signs/Symptoms:Hypotension,                 Edema, Dyspnea and Syncope. Pericardial Effusion. Lung Cancer.  Sonographer:    Pedro Earls  Sherilyn Cooter RDCS Referring Phys: [id number] ALEXIS HUGELMEYER  Sonographer Comments: Technically difficult study due to poor echo windows. Image acquisition challenging due to patient body habitus. IMPRESSIONS  1. Left ventricular ejection fraction, by estimation, is 55 to 60%. The left ventricle has normal function. The left ventricle has no regional wall motion abnormalities. Indeterminate diastolic filling due to E-A fusion.  2. Right ventricular systolic function is normal. The right ventricular size is normal.  3. Trivial mitral valve regurgitation.  4. AV is thickened, calcified with restricted motion.  Difficult to see well. Peak and mean gradients through the valve are 14 and 7 mm Hg respectively AVA (VTI) is 1.75 cm2 Dimensionless index is 0.66 consistent with mild AS. 2 D imaging suggests it is mild to moderate. . Aortic valve regurgitation is not visualized. Aortic valve sclerosis is present, with no evidence of aortic valve stenosis. FINDINGS  Left Ventricle: Left ventricular ejection fraction, by estimation, is 55 to 60%. The left ventricle has normal function. The left ventricle has no regional wall motion abnormalities. Definity contrast agent was given IV to delineate the left ventricular  endocardial borders. The left ventricular internal cavity size was normal in size. There is no left ventricular hypertrophy. Indeterminate diastolic filling due to E-A fusion. Right Ventricle: The right ventricular size is normal. Right vetricular wall thickness was not assessed. Right ventricular systolic function is normal. Left Atrium: Left atrial size was normal in size. Right Atrium: Right atrial size was normal in size. Pericardium: There is no evidence of pericardial effusion. Mitral Valve: There is mild thickening of the mitral valve leaflet(s). Mild mitral annular calcification. Trivial mitral valve regurgitation. Tricuspid Valve: The tricuspid valve is normal in structure. Tricuspid valve regurgitation is trivial. Aortic Valve: AV is thickened, calcified with restricted motion. Difficult to see well. Peak and mean gradients through the valve are 14 and 7 mm Hg respectively AVA (VTI) is 1.75 cm2 Dimensionless index is 0.66 consistent with mild AS. 2 D imaging suggests it is mild to moderate. Aortic valve regurgitation is not visualized. Aortic valve sclerosis is present, with no evidence of aortic valve stenosis. Aortic valve mean gradient measures 6.7 mmHg. Aortic valve peak gradient measures 13.2 mmHg. Aortic valve area, by VTI measures 1.75 cm. Pulmonic Valve: The pulmonic valve was not well visualized.  Pulmonic valve regurgitation is not visualized. Aorta: The aortic root and ascending aorta are structurally normal, with no evidence of dilitation. IAS/Shunts: No atrial level shunt detected by color flow Doppler.  LEFT VENTRICLE PLAX 2D LVIDd:         5.20 cm LVIDs:         3.50 cm LV PW:         1.10 cm LV IVS:        0.90 cm LVOT diam:     2.00 cm LV SV:         58 LV SV Index:   29 LVOT Area:     3.14 cm  LV Volumes (MOD) LV vol d, MOD A2C: 102.0 ml LV vol d, MOD A4C: 173.0 ml LV vol s, MOD A2C: 45.5 ml LV vol s, MOD A4C: 68.6 ml LV SV MOD A2C:     56.5 ml LV SV MOD A4C:     173.0 ml LV SV MOD BP:      81.1 ml RIGHT VENTRICLE          IVC RV Basal diam:  2.70 cm  IVC diam: 2.00 cm TAPSE (M-mode): 1.8 cm LEFT  ATRIUM             Index        RIGHT ATRIUM           Index LA diam:        2.90 cm 1.47 cm/m   RA Area:     11.50 cm LA Vol (A2C):   38.8 ml 19.67 ml/m  RA Volume:   25.50 ml  12.93 ml/m LA Vol (A4C):   20.4 ml 10.34 ml/m LA Biplane Vol: 28.2 ml 14.29 ml/m  AORTIC VALVE AV Area (Vmax):    1.68 cm AV Area (Vmean):   1.76 cm AV Area (VTI):     1.75 cm AV Vmax:           181.67 cm/s AV Vmean:          117.000 cm/s AV VTI:            0.329 m AV Peak Grad:      13.2 mmHg AV Mean Grad:      6.7 mmHg LVOT Vmax:         96.87 cm/s LVOT Vmean:        65.700 cm/s LVOT VTI:          0.183 m LVOT/AV VTI ratio: 0.56  AORTA Ao Root diam: 4.20 cm Ao Asc diam:  3.60 cm MITRAL VALVE MV Area (PHT): 6.54 cm     SHUNTS MV Decel Time: 116 msec     Systemic VTI:  0.18 m MV E velocity: 137.00 cm/s  Systemic Diam: 2.00 cm Dietrich Pates MD Electronically signed by Dietrich Pates MD Signature Date/Time: 02/21/2023/4:11:40 PM    Final    DG Chest Portable 1 View  Result Date: 02/20/2023 CLINICAL DATA:  Fall, dizziness EXAM: PORTABLE CHEST 1 VIEW COMPARISON:  CT chest dated 12/24/2022 FINDINGS: Radiation changes in the right mid lung with adjacent surgical clips. Biapical pleural-parenchymal scarring. Right basilar scarring.  No pleural effusion or pneumothorax. The heart is normal in size.  Thoracic aortic atherosclerosis. IMPRESSION: No evidence of acute cardiopulmonary disease. Radiation changes in the right mid lung. Electronically Signed   By: Charline Bills M.D.   On: 02/20/2023 17:34   MR Brain W Wo Contrast  Result Date: 02/13/2023 CLINICAL DATA:  Small cell lung carcinoma. Stereotactic radio surgery protocol. EXAM: MRI HEAD WITHOUT AND WITH CONTRAST TECHNIQUE: Multiplanar, multiecho pulse sequences of the brain and surrounding structures were obtained without and with intravenous contrast. CONTRAST:  7.95mL GADAVIST GADOBUTROL 1 MMOL/ML IV SOLN COMPARISON:  07/25/2022 FINDINGS: Brain: No acute infarct, mass effect or extra-axial collection. Fewer than 5 scattered microhemorrhages in a nonspecific pattern. There is multifocal hyperintense T2-weighted signal within the white matter. Parenchymal volume and CSF spaces are normal. The midline structures are normal. There is no abnormal contrast enhancement. Vascular: Major flow voids are preserved. Skull and upper cervical spine: Normal calvarium and skull base. Visualized upper cervical spine and soft tissues are normal. Sinuses/Orbits:No paranasal sinus fluid levels or advanced mucosal thickening. No mastoid or middle ear effusion. Normal orbits. IMPRESSION: 1. No intracranial metastatic disease. 2. Multifocal hyperintense T2-weighted signal within the white matter, nonspecific but most commonly due to chronic small vessel ischemia. Electronically Signed   By: Deatra Robinson M.D.   On: 02/13/2023 03:03    Microbiology: Results for orders placed or performed during the hospital encounter of 02/20/23  SARS Coronavirus 2 by RT PCR (hospital order, performed in [facility] hospital lab) *cepheid single result test* Anterior  Nasal Swab     Status: None   Collection Time: 02/21/23  1:10 AM   Specimen: Anterior Nasal Swab  Result Value Ref Range Status   SARS Coronavirus 2 by  RT PCR NEGATIVE NEGATIVE Final    Comment: Performed at [facility], [address], [place], [place] [address]  Expectorated Sputum Assessment w Gram Stain, Rflx to Resp Cult     Status: None   Collection Time: 02/21/23  2:20 AM   Specimen: Sputum  Result Value Ref Range Status   Specimen Description SPUTUM  Final   Special Requests NONE  Final   Sputum evaluation   Final    THIS SPECIMEN IS ACCEPTABLE FOR SPUTUM CULTURE Performed at [facility], [address] [place], [place] [address]    Report Status 02/21/2023 FINAL  Final  Culture, Respiratory w Gram Stain     Status: None   Collection Time: 02/21/23  2:20 AM   Specimen: SPU  Result Value Ref Range Status   Specimen Description SPUTUM  Final   Special Requests NONE Reflexed from [id number]  Final   Gram Stain   Final    RARE WBC PRESENT, PREDOMINANTLY PMN ABUNDANT GRAM NEGATIVE RODS ABUNDANT GRAM POSITIVE RODS RARE GRAM POSITIVE COCCI IN PAIRS Performed at [facility], [address], [place], [place] [address]    Culture ABUNDANT PSEUDOMONAS AERUGINOSA  Final   Report Status 02/24/2023 FINAL  Final   Organism ID, Bacteria PSEUDOMONAS AERUGINOSA  Final      Susceptibility   Pseudomonas aeruginosa - MIC*    CEFTAZIDIME <=1 SENSITIVE Sensitive     CIPROFLOXACIN <=0.25 SENSITIVE Sensitive     GENTAMICIN <=1 SENSITIVE Sensitive     IMIPENEM <=0.25 SENSITIVE Sensitive     PIP/TAZO <=4 SENSITIVE Sensitive     CEFEPIME <=0.12 SENSITIVE Sensitive     * ABUNDANT PSEUDOMONAS AERUGINOSA   *Note: Due to a large number of results and/or encounters for the requested time period, some results have not been displayed. A complete set of results can be found in Results Review.    Labs: CBC: Recent Labs  Lab 02/20/23 1617 02/21/23 0835 02/22/23 0154 02/22/23 1101  WBC 10.4 9.7 7.4 10.6*  NEUTROABS 9.1*  --  6.6  --   HGB 10.5* 9.8* 9.1* 9.6*  HCT 33.8* 31.1* 29.0* 30.4*  MCV 96.8 96.6 96.3 99.7  PLT 210  173 173 201   Basic Metabolic Panel: Recent Labs  Lab 02/20/23 1617 02/21/23 0835 02/22/23 0154  NA 141 137 140  K 4.1 4.4 4.2  CL 92* 92* 98  CO2 34* 33* 31  GLUCOSE 104* 140* 119*  BUN 7* 15 11  CREATININE 1.07 1.20 0.94  CALCIUM 9.4 8.7* 8.5*  MG  --  1.8 1.9  PHOS  --   --  3.6   Liver Function Tests: Recent Labs  Lab 02/20/23 1617 02/22/23 0154  AST 26  --   ALT 20  --   ALKPHOS 49  --   BILITOT 0.3  --   PROT 6.3*  --   ALBUMIN 3.4* 2.9*   CBG: Recent Labs  Lab 02/21/23 0444 02/22/23 0537  GLUCAP 95 121*    Discharge time spent: less than 30 minutes.  Signed: Charolotte Eke, MD [facility] 02/27/2023

## 2023-02-27 NOTE — Telephone Encounter (Signed)
Document faxed

## 2023-03-03 ENCOUNTER — Ambulatory Visit: Payer: Medicare Other | Admitting: Family Medicine

## 2023-03-03 ENCOUNTER — Encounter: Payer: Self-pay | Admitting: Family Medicine

## 2023-03-03 VITALS — BP 110/74 | HR 115 | Temp 98.3°F | Ht 72.0 in | Wt 168.0 lb

## 2023-03-03 DIAGNOSIS — J449 Chronic obstructive pulmonary disease, unspecified: Secondary | ICD-10-CM

## 2023-03-03 DIAGNOSIS — R739 Hyperglycemia, unspecified: Secondary | ICD-10-CM | POA: Diagnosis not present

## 2023-03-03 DIAGNOSIS — I2782 Chronic pulmonary embolism: Secondary | ICD-10-CM

## 2023-03-03 DIAGNOSIS — I10 Essential (primary) hypertension: Secondary | ICD-10-CM | POA: Diagnosis not present

## 2023-03-03 DIAGNOSIS — E559 Vitamin D deficiency, unspecified: Secondary | ICD-10-CM

## 2023-03-03 DIAGNOSIS — D509 Iron deficiency anemia, unspecified: Secondary | ICD-10-CM | POA: Diagnosis not present

## 2023-03-03 DIAGNOSIS — I5022 Chronic systolic (congestive) heart failure: Secondary | ICD-10-CM | POA: Diagnosis not present

## 2023-03-03 DIAGNOSIS — Z8719 Personal history of other diseases of the digestive system: Secondary | ICD-10-CM

## 2023-03-03 DIAGNOSIS — R55 Syncope and collapse: Secondary | ICD-10-CM

## 2023-03-03 NOTE — Assessment & Plan Note (Signed)
Blood sugars have been high in the past but A1c never elevated-update with labs

## 2023-03-03 NOTE — Assessment & Plan Note (Signed)
Edema up some but weight roughly stable-suspect euvolemic with tendency toward dehydration-as above try decrease Lasix to every other day once edema in better position when leg wounds heal-with this they will watch out for increased edema or weight gain and let me know if these occur

## 2023-03-03 NOTE — Assessment & Plan Note (Signed)
Maintained on Lasix 20 mg daily but I suspect he will be able to maintain blood pressure with going to every other day as well-see CHF section

## 2023-03-03 NOTE — Patient Instructions (Addendum)
Let us know if you get your flu or COVID vaccine at the pharmacy.  Once you can get the boots back on- try lasix every other day- if weight or swelling goes up on that can go back up to daily but want to try to avoid dehydration if possible  Please stop by lab before you go If you have mychart- we will send your results within 3 business days of [place] receiving them.  If you do not have mychart- we will call you about results within 5 business days of [place] receiving them.  *please also note that you will see labs on mychart as soon as they post. I will later go in and write notes on them- will say "notes from Dr. Durene Cal"   Recommended follow up: Return for next already scheduled visit or sooner if needed.

## 2023-03-03 NOTE — Assessment & Plan Note (Signed)
Remains on iron every other day-update iron stores as well as CBC to evaluate anemia-this is related to his history of GI bleed.  Also remains on pantoprazole

## 2023-03-03 NOTE — Assessment & Plan Note (Signed)
Remains on chronic Eliquis 2.5 mg twice daily to prevent worsening of chronic pulmonary embolism

## 2023-03-03 NOTE — Progress Notes (Signed)
Phone [phone]   Subjective:  Francisco [name] is a 77 y.o. year old very pleasant male patient who presents for transitional care management and hospital follow up for syncope. Patient was hospitalized from 02/20/2023 to 02/22/2023. A TCM phone call was completed on 02/24/2023. Medical complexity moderate  Patient was in his normal state of health when he walked into the kitchen in the afternoon using his walker but suddenly became unsteady and dizzy.  He called for his wife who came to try to support him but by the time she arrived he had lost consciousness (she saw him trying to hold himself up and he was shaking throughout his body trying to hold himself up-no incontinence or tongue biting).  Thankfully he woke up almost immediately and recalled the entire event other than when he passed out.  No recent change in medication, diet.  Does have history of recurrent falls.did have skin tear on left lower leg- but healing gradually at home- wife experienced.   He was monitored on telemetry and no significant events were noted.  TTE showed aortic sclerosis but no stenosis.  Infectious workup was negative.  Hemoglobin was stable other than a slight decrease after he was hydrated for orthostatic vital signs.  Does have a history of GI bleed but it was not thought this was related.  Ischemia workup was negative.  Orthostatic vital signs were positive so orthostatic syncope was thought high on differential.  He does have a history of sinus tachycardia- saw Dr. Scharlene Gloss  in February 2024.  He has a history of pulmonary embolism but was already maintained on Eliquis 2.5 mg twice daily so not thought related to pulmonary embolism.  His baseline COPD was managed with ongoing oxygen therapy as well as Symbicort, Spiriva.  He was continued on chronic home medications including azithromycin.  Had dealt with recent exacerbation and was treated with cefdinir for 5 days and steroid burst pack.  Prior to hospitalization  patient had been seen by Dr. Jon Billings for left buttocks pressure ulceration related to prolonged sitting with known COPD-wife provided excellent home wound care and worked with patient to redistribute weight. That area has healed up rather well. Still need to get the redistribution pad-  last prescription didn't work- will handwrite.    See problem oriented charting as well  Past Medical History-  Patient Active Problem List   Diagnosis Date Noted   Lumbar compression fracture (HCC) 09/11/2022    Priority: High   Chronic systolic congestive heart failure (HCC) 12/10/2021    Priority: High   Malignant neoplasm of lower lobe of right lung (HCC)     Priority: High   Acute GI bleeding 02/18/2020    Priority: High   PAD (peripheral artery disease) (HCC) 12/11/2018    Priority: High   Stage 4 very severe COPD by GOLD classification (HCC) 11/27/2018    Priority: High   DNR (do not resuscitate) 04/21/2018    Priority: High   Chronic pulmonary embolism (HCC) 04/07/2018    Priority: High   Osteoporosis 11/19/2017    Priority: High   Thoracic compression fracture (HCC) 11/02/2017    Priority: High   Former smoker 08/23/2008    Priority: High   Vitamin D deficiency 03/03/2023    Priority: Medium    Syncope and collapse 02/20/2023    Priority: Medium    Iron deficiency anemia due to chronic blood loss 08/23/2020    Priority: Medium    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  Priority: Medium    History of upper GI bleeding 04/30/2020    Priority: Medium    Essential tremor 03/31/2018    Priority: Medium    BPPV (benign paroxysmal positional vertigo), right 02/10/2018    Priority: Medium    Essential hypertension 11/02/2017    Priority: Medium    BPH associated with nocturia 06/25/2017    Priority: Medium    Hyperglycemia 06/12/2016    Priority: Medium    Hyperlipidemia 07/22/2014    Priority: Medium    Pre-operative respiratory examination 12/10/2018    Priority: Low    Venous stasis dermatitis of both lower extremities 11/02/2017    Priority: Low   Chronic respiratory failure with hypoxia (HCC) 09/23/2017    Priority: Low   Leukocytosis 12/19/2016    Priority: Low   Onychomycosis 07/22/2014    Priority: Low   Left ankle swelling 07/22/2014    Priority: Low   History of skin cancer 05/17/2014    Priority: Low   Chronic rhinitis 10/28/2012    Priority: Low   Pulmonary nodule 09/23/2011    Priority: Low   History of colonic polyps 08/23/2008    Priority: Low   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 Current Outpatient Medications  Medication Sig Dispense Refill   acetaminophen (TYLENOL) 500 MG tablet Take 500 mg by mouth every 4 (four) hours as needed for moderate pain, fever, headache or mild pain.     albuterol (PROVENTIL) (2.5 MG/3ML) 0.083% nebulizer solution Take 3 mLs (2.5 mg total) by nebulization every 6 (six) hours as needed for wheezing or shortness of breath. 75 mL 12   azithromycin (ZITHROMAX) 250 MG tablet Take 250 mg by mouth See admin instructions. Continuous M,W,F     Calcium Carbonate-Vitamin D (CALCIUM 600+D PO) Take 2 tablets by mouth daily.     docusate sodium (COLACE) 100 MG capsule Take 2 capsules (200 mg total) by mouth 2 (two) times daily. (Patient taking differently: Take 200 mg by mouth 2 (two) times daily. Patient uses PRN) 10 capsule 0   ELIQUIS 2.5 MG TABS tablet TAKE 1 TABLET BY MOUTH TWICE  DAILY (Patient taking differently: Take 2.5 mg by mouth 2 (two) times  daily.) 180 tablet 3   Ferrous Sulfate (IRON) 325 (65 Fe) MG TABS Take 325 mg by mouth every evening.     fluticasone (FLONASE) 50 MCG/ACT nasal spray USE 2 SPRAYS IN BOTH  NOSTRILS DAILY (Patient taking differently: Place 2 sprays into both nostrils daily.) 48 g 3   folic acid (FOLVITE) 1 MG tablet Take 1 tablet (1 mg total) by mouth daily.     furosemide (LASIX) 20 MG tablet TAKE 1 TABLET BY MOUTH DAILY AS  NEEDED FOR FLUID OR EDEMA (Patient taking differently: Take 20 mg by mouth daily.) 90 tablet 3   Multiple Vitamin (MULTIVITAMIN WITH MINERALS) TABS tablet Take 1 tablet by mouth daily.     mupirocin cream (BACTROBAN) 2 % Apply 1 Application topically 2 (two) times daily. 30 g 0   oxyCODONE (OXY IR/ROXICODONE) 5 MG immediate release tablet Take 5 mg by mouth every 4 (four)  hours as needed for severe pain.     OXYGEN Inhale 4-5 L into the lungs continuous.      pantoprazole (PROTONIX) 20 MG tablet TAKE 1 TABLET BY MOUTH DAILY 90 tablet 3   Polyethyl Glycol-Propyl Glycol (SYSTANE) 0.4-0.3 % SOLN Place 1 drop into both eyes every other day.     predniSONE (DELTASONE) 10 MG tablet TAKE 1 TABLET BY MOUTH DAILY  WITH BREAKFAST 90 tablet 3   predniSONE (DELTASONE) 10 MG tablet Take 10 mg by mouth daily with breakfast.     predniSONE (DELTASONE) 50 MG tablet Take one tab po 13 hours prior, then one tab 7 hours prior, and then one tab 1 hour prior to CT. Also ask pt to take benadryl 50 mg po 1 hour prior to CT 3 tablet 0   roflumilast (DALIRESP) 500 MCG TABS tablet TAKE 1 TABLET BY MOUTH DAILY 90 tablet 3   rosuvastatin (CRESTOR) 20 MG tablet Take 1 tablet (20 mg total) by mouth daily. (Patient taking differently: Take 20 mg by mouth every evening.) 90 tablet 3   SYMBICORT 160-4.5 MCG/ACT inhaler USE 2 INHALATIONS BY MOUTH TWICE DAILY 30.6 g 3   Tiotropium Bromide Monohydrate (SPIRIVA RESPIMAT) 2.5 MCG/ACT AERS USE 2 INHALATIONS BY MOUTH ONCE  DAILY 12 g 3   traMADol (ULTRAM) 50 MG tablet Take 50 mg by  mouth every 12 (twelve) hours as needed for severe pain.     No current facility-administered medications for this visit.   Objective  Objective:  BP 110/74   Pulse (!) 115   Temp 98.3 F (36.8 C)   Ht 6' (1.829 m)   Wt 168 lb (76.2 kg)   SpO2 90%   BMI 22.78 kg/m  Gen: NAD, resting comfortably, wears 4 L of oxygen CV: Tachycardic but regular no murmurs rubs or gallops Lungs: CTAB no crackles, wheeze, rhonchi Abdomen: soft/nontender/nondistended/normal bowel sounds. Ext: 1+ edema bilaterally, wound on left leg is dressed and family and patient preferred not to undress today Skin: warm, dry Neuro: In wheelchair today   Assessment and Plan:   # TCM/hospital follow-up Assessment & Plan Syncope and collapse Syncope thought most likely related to orthostatic hypotension with positive orthostatic vital signs and reassuring echocardiogram, EKG, troponin trend, infectious workup.  No recurrence after hydration. - We discussed possibly going to Lasix every other day to help avoid dehydration for his CHF after his leg wounds heal and is able to get back into his compression boots that have helped with venous stasis issues - Patient with deconditioning as well as left leg wound and I have helped coordinate home health physical therapy and wound care through home health agency-this visit will set therapy as face-to-face visit. -We discussed option of referring back to cardiology-saw earlier this year for sinus tachycardia likely he would like to hold off unless recurrent issues especially since orthostatic hypotension very likely.  Part of patient declining this is his desire to de-escalate care is much as possible to focus on quality of life and minimize medical appointments-both he and his wife have expressed  this interest in the past - Also mentioned possible neurology visit but sounds like shaking before falls related to weakness not seizure-like activity and without recurrence we will hold  off End stage COPD (HCC) Ongoing chronic COPD issues.  Treated for exacerbation in the hospital with cefdinir and steroid burst-has had improvement but still has chronic cough and shortness of breath and on 4 L of oxygen today.  He  has finished his cefdinir course -Remain on Daliresp, Symbicort, Spiriva Chronic systolic congestive heart failure (HCC) Edema up some but weight roughly stable-suspect euvolemic with tendency toward dehydration-as above try decrease Lasix to every other day once edema in better position when leg wounds heal-with this they will watch out for increased edema or weight gain and let me know if these occur  Essential hypertension Maintained on Lasix 20 mg daily but I suspect he will be able to maintain blood pressure with going to every other day as well-see CHF section Iron deficiency anemia, unspecified iron deficiency anemia type Remains on iron every other day-update iron stores as well as CBC to evaluate anemia-this is related to his history of GI bleed.  Also remains on pantoprazole Vitamin D deficiency History of low vitamin D but was high last year-and was to hold vitamin D short-term but later restarted-we will check today to make sure has not trended up too much Hyperglycemia Blood sugars have been high in the past but A1c never elevated-update with labs Other chronic pulmonary embolism, unspecified whether acute cor pulmonale present (HCC) Remains on chronic Eliquis 2.5 mg twice daily to prevent worsening of chronic pulmonary embolism  # Possible skin cancer on right arm-patient agrees to schedule visit with Dr. Karlyn Agee  Recommended follow up: Return for next already scheduled visit or sooner if needed. Future Appointments  Date Time Provider Department Center  03/14/2023  2:45 PM Kalman Shan, MD [facility] None  04/17/2023  9:40 AM Cirigliano, Vito V, DO [facility]  06/19/2023  9:00 AM [facility] None  06/23/2023  1:45 PM  Si Gaul, MD [facility] None  08/18/2023  1:00 PM Ronny Bacon, PA-C [facility] None  08/21/2023  1:20 PM Durene Cal Aldine Contes, MD [facility]    Lab/Order associations:   ICD-10-CM   1. Syncope and collapse  R55     2. End stage COPD (HCC)  J44.9     3. Chronic systolic congestive heart failure (HCC)  I50.22     4. Essential hypertension  I10 Comprehensive metabolic panel    CBC with Differential/Platelet    5. Iron deficiency anemia, unspecified iron deficiency anemia type  D50.9 IBC + Ferritin    6. Vitamin D deficiency  E55.9     7. Hyperglycemia  R73.9 HgB A1c    8. History of GI bleed  Z87.19     9. Other chronic pulmonary embolism, unspecified whether acute cor pulmonale present (HCC)  I27.82       No orders of the defined types were placed in this encounter.   Return precautions advised.  Tana Conch, MD

## 2023-03-03 NOTE — Assessment & Plan Note (Signed)
Syncope thought most likely related to orthostatic hypotension with positive orthostatic vital signs and reassuring echocardiogram, EKG, troponin trend, infectious workup.  No recurrence after hydration. - We discussed possibly going to Lasix every other day to help avoid dehydration for his CHF after his leg wounds heal and is able to get back into his compression boots that have helped with venous stasis issues - Patient with deconditioning as well as left leg wound and I have helped coordinate home health physical therapy and wound care through home health agency-this visit will set therapy as face-to-face visit. -We discussed option of referring back to cardiology-saw earlier this year for sinus tachycardia likely he would like to hold off unless recurrent issues especially since orthostatic hypotension very likely.  Part of patient declining this is his desire to de-escalate care is much as possible to focus on quality of life and minimize medical appointments-both he and his wife have expressed  this interest in the past - Also mentioned possible neurology visit but sounds like shaking before falls related to weakness not seizure-like activity and without recurrence we will hold off

## 2023-03-03 NOTE — Assessment & Plan Note (Signed)
History of low vitamin D but was high last year-and was to hold vitamin D short-term but later restarted-we will check today to make sure has not trended up too much

## 2023-03-04 LAB — CBC WITH DIFFERENTIAL/PLATELET
Basophils Absolute: 0 10*3/uL (ref 0.0–0.1)
Basophils Relative: 0.4 % (ref 0.0–3.0)
Eosinophils Absolute: 0.1 10*3/uL (ref 0.0–0.7)
Eosinophils Relative: 0.7 % (ref 0.0–5.0)
HCT: 33.3 % — ABNORMAL LOW (ref 39.0–52.0)
Hemoglobin: 10.7 g/dL — ABNORMAL LOW (ref 13.0–17.0)
Lymphocytes Relative: 6.7 % — ABNORMAL LOW (ref 12.0–46.0)
Lymphs Abs: 0.7 10*3/uL (ref 0.7–4.0)
MCHC: 32.1 g/dL (ref 30.0–36.0)
MCV: 95.4 fl (ref 78.0–100.0)
Monocytes Absolute: 0.7 10*3/uL (ref 0.1–1.0)
Monocytes Relative: 7 % (ref 3.0–12.0)
Neutro Abs: 8.8 10*3/uL — ABNORMAL HIGH (ref 1.4–7.7)
Neutrophils Relative %: 85.2 % — ABNORMAL HIGH (ref 43.0–77.0)
Platelets: 234 10*3/uL (ref 150.0–400.0)
RBC: 3.49 Mil/uL — ABNORMAL LOW (ref 4.22–5.81)
RDW: 13.8 % (ref 11.5–15.5)
WBC: 10.4 10*3/uL (ref 4.0–10.5)

## 2023-03-04 LAB — COMPREHENSIVE METABOLIC PANEL
ALT: 19 U/L (ref 0–53)
AST: 25 U/L (ref 0–37)
Albumin: 3.7 g/dL (ref 3.5–5.2)
Alkaline Phosphatase: 58 U/L (ref 39–117)
BUN: 17 mg/dL (ref 6–23)
CO2: 42 mEq/L — ABNORMAL HIGH (ref 19–32)
Calcium: 9.6 mg/dL (ref 8.4–10.5)
Chloride: 94 meq/L — ABNORMAL LOW (ref 96–112)
Creatinine, Ser: 1 mg/dL (ref 0.40–1.50)
GFR: 73.65 mL/min (ref >60.00)
Glucose, Bld: 93 mg/dL (ref 70–99)
Potassium: 4.9 meq/L (ref 3.5–5.1)
Sodium: 142 meq/L (ref 135–145)
Total Bilirubin: 0.4 mg/dL (ref 0.2–1.2)
Total Protein: 6.5 g/dL (ref 6.0–8.3)

## 2023-03-04 LAB — IBC + FERRITIN
Ferritin: 27.6 ng/mL (ref 22.0–322.0)
Iron: 30 ug/dL — ABNORMAL LOW (ref 42–165)
Saturation Ratios: 6.9 % — ABNORMAL LOW (ref 20.0–50.0)
TIBC: 436.8 ug/dL (ref 250.0–450.0)
Transferrin: 312 mg/dL (ref 212.0–360.0)

## 2023-03-04 LAB — HEMOGLOBIN A1C: Hgb A1c MFr Bld: 5.3 % (ref 4.6–6.5)

## 2023-03-14 ENCOUNTER — Encounter: Payer: Self-pay | Admitting: Internal Medicine

## 2023-03-14 ENCOUNTER — Ambulatory Visit (INDEPENDENT_AMBULATORY_CARE_PROVIDER_SITE_OTHER): Payer: Medicare Other | Admitting: Internal Medicine

## 2023-03-14 VITALS — BP 124/66 | HR 111 | Ht 72.0 in | Wt 168.0 lb

## 2023-03-14 DIAGNOSIS — Z23 Encounter for immunization: Secondary | ICD-10-CM

## 2023-03-14 DIAGNOSIS — J9611 Chronic respiratory failure with hypoxia: Secondary | ICD-10-CM | POA: Diagnosis not present

## 2023-03-14 DIAGNOSIS — J441 Chronic obstructive pulmonary disease with (acute) exacerbation: Secondary | ICD-10-CM | POA: Diagnosis not present

## 2023-03-14 MED ORDER — PREDNISONE 10 MG PO TABS: ORAL_TABLET | ORAL | 0 refills | Status: DC

## 2023-03-14 MED ORDER — DOXYCYCLINE HYCLATE 100 MG PO TABS: 100.0000 mg | ORAL_TABLET | Freq: Two times a day (BID) | ORAL | 0 refills | Status: DC

## 2023-03-14 NOTE — Patient Instructions (Addendum)
Nodule of lower lobe of right lung - 9mm new Jul 10, 2021. 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   Plan - Take doxycycline 100mg  po twice daily x 5 days; take after meals and avoid sunlight  - hold azithromycin during this time - Take prednisone 40 mg daily x 2 days, then 30mg  daily x 2 days, then 20mg  daily x 2 days, then 10mg  daily baseline to cntiue - Otherwise  continue  daily prednisone 10mg , azithromycin MWF, and roflumilast daily per routine   --Continue oxygen, Spiriva, Symbicort, Daliresp and 3 times weekly azithromycin and daily prednisone otherwise (after flare up Rx)     History Pulmonary embolism -   -normal d-dimer Jan 2023  Plan  - - continue low dose eliquis for now esp with cancer and leg edema    Follow-up -3 months with dR Tyrek Lawhorn ; 15 min visit

## 2023-03-14 NOTE — Progress Notes (Signed)
ROV 12/30/16 -- patient has a history of tobacco use, COPD currently maintained on chronic prednisone, Symbicort, Spiriva. Most recent blurry function testing was March 2011 with an FEV1 of 1.2 L (34% Pred), severe obstruction. He up titrates his prednisone on his own based on symptoms. No abx since last time. He has increased pred temporrily about 4 -5 x since last time. No increases for over a month. He is smoking a pipe, not cigarettes. He is using HCTZ more frequently these days. He is on flonase and singulair. He does note that he has some slow progression of his DOE. He coughs a few times a day, clears clear sputum.   rov 07/17/17 --this is a follow-up visit for patient with active tobacco use, severe COPD and chronic prednisone use.  He often titrates his prednisone on his own depending on how he is feeling. He complains of a new pain in his back below his left shoulder blade, has been present for the last 10 days. sometimes pleuritic, can be stabbing, worse w cough. His pred use: on 10mg  for the last 19 days. No hx VTE. He has LE edema, venous stasis changes, increased since his diuretics decreased.   ROV 09/23/17 --77 year old man with a history of tobacco use (still smokes pipe), severe COPD and severe obstruction on spirometry.  At his last visit he had some pleuritic left back pain and I performed a CT PA that I have reviewed from 07/17/17.  There was no pulmonary embolism but extensive emphysema present, some bilateral apical scar more so on the right, stable.  He uses prednisone 10 mg daily and uptitrate depending on his day-to-day symptoms.  He is otherwise managed on Symbicort and Spiriva.  Desaturated on arrival today after ambulating to the exam room. He reports more dyspnea, has increased pred intermittently.    OV 12/05/2017  Chief Complaint  Patient presents with   Follow-up    Switching from RB to MR.   Pt is on 3L with exertion and 2L at rest. Pt states his breathing had  become worse but since he has been on O2 since 3/21, it has really helped with his breathing.  Pt was in hospital 4/28-5/3. Other than SOB, pt does have c/o cough, rattling in chest. Denies any CP/chest tightness.   History from patient, his wife and review of the old chart  Francisco [name] a transfer of care from Francisco [name] to myself Dr. Marchelle [name].  His son is my patient and therefore he is done this transfer of care.  According to the patient he is to be seen by Dr. Shelle [name] for COPD.  Patient is FEV1 34% based on March 2011 PFTs according to chart review.  At baseline he is maintained on Spiriva, Symbicort, Singulair, chronic daily prednisone 10 mg/day for at least 3 years, and 3 times a week of azithromycin for 2 years.  Earlier this year in March 2019 he went on daytime oxygen and following a recent hospitalization April-May 2019.  For syncope that was believed due to nocturnal hypoxemia he was started on night oxygen.  Since starting oxygen his edema has improved and his overall quality of life is improved and he stopped having any orthostasis or presyncopal episodes.  He is grateful for the fact that he is on oxygen.  His current COPD CAT score and severity of symptoms is rated below and is 26.  Show significant amount of symptom burden.  His main goal is to improve  his quality of life.  He had his wife have many questions centering around quality of life, medication therapy.  Of note he has had some thoracic vertebral fractures that are new.  This happened after the fall in late April 2019.  Discovered at admission last month.  He feels is due to prednisone.  He is wondering about portable oxygen.   OV 01/29/2018  Chief Complaint  Patient presents with   Follow-up    Pt states he has been doing okay since last visit. States breathing is about the same, has an occ cough but states the O2 has helped out a lot with breathing and cough. Denies any complaints of CP.   Francisco [name] , 77 y.o. ,  with dob 1946-01-25 and male ,Not Hispanic or Latino from [address] [place] [address] - presents to lung clinic for advanced COPD follow-up. Since his last visit his symptoms course of improvement. Score is 26 and severe symptoms of document below. He is on Spiriva, Symbicort, continues oxygen, daily prednisone and azithromycin 3 times a week. He has stopped his singulair without any problems. Overall he says he is better. He is willing to give TRELEGY inhaler trial. Last visit to check blood gas and he does not have hypercapnia so he does not qualify for BiPAP        OV 04/03/2018  Subjective:  Patient ID: Francisco [name], male , DOB: 09/30/1945 , age 89 y.o. , MRN: [id number] , ADDRESS: [address] [place] [address]   04/03/2018 -   Chief Complaint  Patient presents with   Acute Visit    Pt's O2 sats have been dropping into the 70s with little exertion.  Pt took O2 off just to shave 9/26 and after he finished shaving put the O2 back on, walked from the bathroom down to the living room on O2 and sats were at 77% on 3L 9/26. Pt also has c/o cough with white to yellow mucus and chest tightness with the SOB.     HPI Francisco [name] 77 y.o. -acute visit for this patient.  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  He is chronically prednisone dependent.  He also is on schedule azithromycin.  He called in yesterday feeling unwell therefore we asked him to come in today.  He tells me that approximately a week ago he started having increased cough and congestion.  Yesterday primary care physician following a routine visit thought he was in COPD exacerbation and started him on doxycycline.  He also personally bumped up his baseline prednisone.  He does me that yesterday while he was shaving on room air he felt more short of breath than usual.  Also when he walked he desaturated more than usual therefore he decided to call in and come in today.   There is no fever or chills or hemoptysis or colored sputum.  No leg edema.  No orthopnea.  He does not feel like he needs to be in the emergency department or get admitted.   OV 05/11/2018  Subjective:  Patient ID: Francisco [name], male , DOB: [birth year] , age 21 y.o. , MRN: [id number] , ADDRESS: [address] [place] [address]   05/11/2018 -   Chief Complaint  Patient presents with   Follow-up    pt states breathing has wrosen since last OV. increased sob, occ chest tightness, prod cough with yellow mucus &  chest congestion. currently taking trelegy samples. recent admission 04/07/18 for PE.     HPI Francisco [name] 77 y.o. -advanced COPD with chronic hypoxemic respiratory failure [not a BiPAP candidate because of lack of hypercapnia] presents with his wife for follow-up.  After the last visit he continued to have symptoms so on April 07, 2018 he got an outpatient CT scan and he had pulmonary embolism.  He got admitted to the hospital and then discharged and since then has been on Xarelto.  He continues his baseline 2-3 L of nasal cannula oxygen but he says since then he is more symptomatic.  Is also corresponds with him starting Trelegy inhaler and also for the last few to several weeks is having increased cough and congestion and yellow phlegm.  His COPD CAT score is deteriorated to 35 and above.  He is very frustrated by his symptoms.  Review of his medication shows that he takes azithromycin and chronic prednisone and Trelegy and oxygen for his COPD.  He is on Xarelto for his blood clots.  He is also on Fosamax for prednisone dependent osteoporosis management.  His smoking is in remission.  Wife says he is always sedentary.  Apparently they tried to do some physical therapy a month ago but he desaturated.  He does not seem to keen on physical therapy right now     OV 06/10/2018  Subjective:  Patient ID: Francisco [name], male , DOB: 1945/09/23 , age 61 y.o. , MRN: [id number] ,  ADDRESS: [address] [place] [address]   06/10/2018 -   Chief Complaint  Patient presents with   Follow-up    follows for COPD. patient has increased SOB with exertion     HPI Francisco [name] 77 y.o. -returns for gold stage IV COPD follow-up with chronic hypoxemic respiratory failure.  I saw him approximately a month ago at which time recommended Trelegy.  However the Trelegy is not working well for him.  He feels Spiriva and Symbicort is of benefit for him.  Also recommended he start himself on Daliresp.  Given the GI side effect profile I communicated with his primary care physician about stopping Fosamax.  His primary care physician has advised Reclast infusion for him and is considering this but has not yet switched over.  This because the infusions will have to happen at [facility] long.  However he never started his Daliresp because he read the side effect profile and was worried about back pain.  He wanted me to go over the side effect profile of Daliresp all over again.  I printed up-to-date and went over all the side effects.  I did this with him and his wife.  There is a 3% incidence of backache.  The dominant feature is GI issues of weight loss and diarrhea and nausea and abdominal pain.  The second dominant feature is some anxiety issues.  After reading all this he has decided to start Daliresp.  He is aware of the limitations as well.  He is aware that this is purely preventive in reducing COPD exacerbation frequency.  He understands that the burden on his health from frequent COPD exacerbations is high.  Currently COPD CAT score is back at baseline        OV 07/22/2018  Subjective:  Patient ID: Francisco [name], male , DOB: Apr 17, 1946 , age 60 y.o. , MRN: [id number] , ADDRESS: [address] [place] [address]   07/22/2018 -  Chief Complaint  Patient presents with   Follow-up    Pt states it has been rough since last visit. States he has been coughing with  white to yellow phlegm, wheezing, increased SOB which has been going on x2-3 weeks now. Pt denies any real complaints of chest tightness/chest pain.     HPI Francisco [name] 77 y.o. -presents for follow-up of his advanced COPD with chronic hypoxemic respiratory failure.  At last visit we introduce Daliresp as is an effort to prevent COPD flareups.  After some trepidation he started taking it.  He is currently on full dose 5 mcg daily and is tolerating it well.  However he does not think it is reduced the frequency of flareups but again it is too soon to tell.  Approximately over a week ago after some sick contact exposure he had symptoms and signs of COPD exacerbation.  He was given I suspect Levaquin but this caused some confusion.  After that he was changed to doxycycline.  Today's his last of 7 days with doxycycline he is better.  COPD CAT score is improved to 26 but he still is coughing quite a bit and has mucus and feels that he is still not fully back to baseline.  He had a chest x-ray July 13, 2018 that reports this is clear.  He continues on oxygen Spiriva and Symbicort.  There are no other new issues.  No chest pain edema hemoptysis or weight loss.    OV 03/05/2019  Subjective:  Patient ID: Francisco [name], male , DOB: 1945-12-29 , age 76 y.o. , MRN: [id number] , ADDRESS: [address] [place] [address]   03/05/2019 -   Chief Complaint  Patient presents with   COPD with acute exacerbation    Feels it is a little worse since June. Has cough with congestion, was white in color. But this morning it has a yellow and green color to it.     HPI Francisco [name] 77 y.o. -returns for his advanced COPD follow-up.  He says that since his last visit he has been doing overall fair and stable.  Uses 3 L of oxygen, Daliresp, Spiriva, Symbicort.  He has been avoiding risk activities for COVID-19.  He says in the last few weeks he has had increased congestion he does not think this is  COVID-19.  Today he has green-yellow sputum.  He is asking about taking a flu shot.    OV 05/07/2019  Subjective:  Patient ID: Francisco [name], male , DOB: [birth year] , age 27 y.o. , MRN: [id number] , ADDRESS: [address] [place] [address]   05/07/2019 -   Chief Complaint  Patient presents with   Follow-up    Pt states his breathing is at his baseline. Pt c/o prod cough with clear mucus - baseline for pt. Pt denies CP/tightness and f/c/s.    Advance COPD with chronic hypoxemic respiratory failure.  Baseline functional status ECOG 3-4  HPI Francisco [name] 77 y.o. -presents for COPD follow-up.  He continues to be stable using oxygen.  His current COPD CAT score is 30 which is his baseline.  He recently had a vascular surgery.  He is on oxygen, prednisone, Daliresp, Spiriva and Symbicort.  He is gone back to taking azithromycin for prophylaxis m again COPD exacerbation.        OV 11/11/2019  Subjective:  Patient ID: Francisco [name], male , DOB: [birth year] ,  age 73 y.o. , MRN: [id number] , ADDRESS: [address] [place] [address]   11/11/2019 -   Chief Complaint  Patient presents with   Follow-up    Pt states his breathing is progressively becoming worse and states he feels like he may be in a flare with the COPD. Pt states he is coughing up yellow-green phlegm and also has complaints of wheezing.   Advance COPD patient with recurrent COPD exacerbations.  HPI Francisco [name] 77 y.o. -presents for follow-up.  He says last month he saw Elisha Headland nurse practitioner.  He says he was given a different antibiotic that really helped.  Review of the records indicate one time it was Augmentin another time it was [place].  He believes the second 1 really helped.  He feels in another exacerbation.  He is having green sputum.  He says with the flutter valve and exacerbation a lot of sputum comes out but between exacerbations is very minimal.  He is frustrated by his  recurrent exacerbations.  He did not tolerate Trelegy in the past.  He is on Spiriva, Symbicort, oxygen 3 L, flutter valve, Flonase, Roflumilast and 3 times a week of azithromycin.  He is looking to see if he can improve his quality of life.  His COPD CAT score is 34.  His last CT scan of the chest was in 2019.     CAT Score 11/11/2019 03/05/2019 04/03/2018  Total CAT Score 32 30 30     OV 11/26/2019 - telephone visit. 2PHI identified. Risks, benefit, limitation explained  Subjective:  Patient ID: Francisco [name], male , DOB: 1945/11/13 , age 53 y.o. , MRN: [id number] , ADDRESS: [address] [place] [address] Advanced COPD with recurrent exacerbation now colonized by pseudomonas  11/26/2019 -    HPI Francisco [name] 77 y.o. -last seen by myself Nov 11, 2019.  At that time we decided to do a work-up for recurrent exacerbations.  His immunoglobulin levels are normal.  His sputum grew Pseudomonas that is pansensitive.  He CT scan of the chest shows right upper lobe worsening fibrotic pattern.  There is no mass per se.  I personally visualized the CT chest.  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  Today in the visit he tells me after 3 days he had nausea and then he had joint pain on the third day and quit taking it.  He still continues to be symptomatic.     ROS - per HPI  CT Chest Wo Contrast  Result Date: 11/25/2019 CLINICAL DATA:  COPD exacerbation. Severe shortness of breath. EXAM: CT CHEST WITHOUT CONTRAST TECHNIQUE: Multidetector CT imaging of the chest was performed following the standard protocol without IV contrast. COMPARISON:  04/07/2018. FINDINGS: Cardiovascular: The heart size is normal. There is no pericardial effusion. Aortic atherosclerosis. Three vessel coronary artery atherosclerotic calcifications identified Mediastinum/Nodes: No enlarged mediastinal or axillary  lymph nodes. Thyroid gland, trachea, and esophagus demonstrate no significant findings. Lungs/Pleura: No pleural effusion identified. Advanced changes of centrilobular and paraseptal emphysema with mild bullous changes. Progressive confluent fibrotic with extensive architectural distortion and volume loss no acute airspace consolidation, atelectasis or pneumothorax. Scattered, bilateral areas of pleuroparenchymal scarring identified changes within the apical portions of the right upper lobe are again noted. Upper Abdomen: Bilateral low-attenuation adrenal nodules appears similar to previous exam and are compatible with benign  adenomas. No acute abnormality identified within the imaged portions of the upper abdomen. Musculoskeletal: Mild scoliosis and degenerative disc disease noted within the thoracic spine. Stable chronic compression deformities at T6, T7 and T8. No new compression fractures. IMPRESSION: 1. No acute cardiopulmonary abnormalities. 2. Progressive confluent fibrotic changes within the right upper lobe with extensive architectural distortion and volume loss. Likely the sequelae of prior inflammation/infection. 3. Aortic atherosclerosis, in addition to 3 vessel coronary artery disease. Please note that although the presence of coronary artery calcium documents the presence of coronary artery disease, the severity of this disease and any potential stenosis cannot be assessed on this non-gated CT examination. Assessment for potential risk factor modification, dietary therapy or pharmacologic therapy may be warranted, if clinically indicated. 4. Bilateral adrenal adenomas. 5. Stable chronic compression deformities at T6, T7 and T8. 6. Diffuse bronchial wall thickening with emphysema, as above; imaging findings suggestive of underlying COPD. Aortic Atherosclerosis (ICD10-I70.0) and Emphysema (ICD10-J43.9). Electronically Signed   By: Signa Kell M.D.   On: 11/25/2019 12:21     OV  05/03/2020  Subjective:  Patient ID: Francisco [name], male , DOB: 10/06/1945 , age 23 y.o. , MRN: [id number] , ADDRESS: [address] [place] [address] PCP Shelva Majestic, MD Patient Care Team: Shelva Majestic, MD as PCP - General (Family Medicine) Kalman Shan, MD as Consulting Physician (Pulmonary Disease) Arminda Resides, MD as Consulting Physician (Dermatology) Dr. Mickie Hillier, DDS (Dentistry)  This Provider for this visit: Treatment Team:  Attending Provider: Kalman Shan, MD    05/03/2020 -   Chief Complaint  Patient presents with   Follow-up    doing best he has in a year and a half.     ICD-10-CM   1. Stage 4 very severe COPD by GOLD classification (HCC)  J44.9   2. Chronic respiratory failure with hypoxia (HCC)  J96.11   3. COPD, frequent exacerbations (HCC)  J44.1   4. Abnormal findings on diagnostic imaging of lung  R91.8   5. Pulmonary embolism, unspecified chronicity, unspecified pulmonary embolism type, unspecified whether acute cor pulmonale present (HCC)  I26.99   6. Vaccine counseling  Z71.85      HPI Francisco [name] 77 y.o. -presents with his wife for the above issues.  He said his Covid booster has had his flu shot.  He has follow-up coming end of November with primary care physician.  He wants me to send a note to the primary care physician.  He is without any exacerbations.  He continues prednisone, azithromycin, Roflumilast, Spiriva and Symbicort.  He does not want to do triple inhaler therapies.  He is on anticoagulation.  He says after the GI bleeding issues resolved he is better.  He had a CT scan of the chest that is documented below.  His infiltrates are improving but there is mucus/debris in the main airway he is denying any aspiration.  He does have hiatal hernia.  Apparently this was discovered during endoscopy.  CAT Score 05/03/2020 11/11/2019 03/05/2019 04/03/2018  Total CAT Score 16 32 30 30     IMPRESSION: 1. Trace right  pleural effusion and associated atelectasis or consolidation, both of which are improved compared to prior examination. Findings are generally consistent with resolving sequelae of prior infection or aspiration. 2. Diffuse bilateral bronchial wall thickening consistent with nonspecific infectious or inflammatory bronchitis. 3. Frothy debris in the bilateral mainstem bronchi, concerning for aspiration. 4. Severe centrilobular emphysema. Emphysema (ICD10-J43.9). 5. Nonspecific fibrotic  scarring of the right greater than left lung apices, likely sequelae of prior infection or inflammation. 6. Coronary artery disease.  Aortic Atherosclerosis (ICD10-I70.0).     Electronically Signed   By: Lauralyn Primes M.D.   On: 04/26/2020 13:42   ROS - per HPI  OV 08/31/2020  Subjective:  Patient ID: Francisco [name], male , DOB: [birth year] , age 67 y.o. , MRN: [id number] , ADDRESS: [address] [place] [address] PCP Shelva Majestic, MD Patient Care Team: Shelva Majestic, MD as PCP - General (Family Medicine) Kalman Shan, MD as Consulting Physician (Pulmonary Disease) Arminda Resides, MD as Consulting Physician (Dermatology) Dr. Mickie Hillier, DDS (Dentistry)  This Provider for this visit: Treatment Team:  Attending Provider: Kalman Shan, MD    08/31/2020 -   Chief Complaint  Patient presents with   Follow-up    More congestion at the moment, more SOB   Advanced end-stage COPD on oxygen and ECOG 3-4 History of pulmonary embolism on anticoagulation  HPI Francisco [name] 77 y.o. -presents for follow-up.  Presents with his wife he sitting on wheelchair.  He says for the last few weeks he is having a flareup with increased urine production that is green.  He wants antibiotics.  He says in December 2021 I switched his Omnicef to ciprofloxacin based on Pseudomonas and sensitivity but he actually felt the Phil Campbell worked better.  However he said that the duration again was too  short and he actually wants Omnicef again for 14 days at this time.  He also thinks a prednisone taper/burst would help him.  Otherwise he continues on Spiriva Symbicort Daliresp 3 times weekly azithromycin and daily prednisone 10 mg.  Is also on oxygen.  He is on anticoagulation for his pulmonary embolism.  He fell down and broke his back but did not have any bleeding.  He is going to have an MRI.  Noted that he is not on any Mucinex and is going to start this after my advice.  We discussed about switching his Breo and Symbicort to a triple inhaler combination either Trelegy or BREZTRI but he says both of not working he does not want to do it.   OV 04/26/2021  Subjective:  Patient ID: Francisco [name], male , DOB: 1946-01-27 , age 44 y.o. , MRN: [id number] , ADDRESS: [address] [place] [address] PCP Shelva Majestic, MD Patient Care Team: Shelva Majestic, MD as PCP - General (Family Medicine) Kalman Shan, MD as Consulting Physician (Pulmonary Disease) Arminda Resides, MD as Consulting Physician (Dermatology) Dr. Mickie Hillier, DDS (Dentistry)  This Provider for this visit: Treatment Team:  Attending Provider: Kalman Shan, MD    04/26/2021 -   Chief Complaint  Patient presents with   Follow-up    Pt states he is about the same since last visit. Has problems with coughing and will get up occ white to yellow phlegm and also has had some wheezing.    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 -returns for follow-up with his wife.  His ECOG continues to be 3-4.  He is wheelchair and minimal activities.  He is on oxygen, Spiriva, Symbicort, azithromycin for preventing COPD exacerbations, Daliresp, daily prednisone.  Is not using 3% saline nebulizer.  Instead he says flutter valve  works well for him.  Last seen in  February 2022 and since then no exacerbations.  The longest time he has had without exacerbation he is happy about it.  He has some waxing and waning sputum production.  Few days ago it was yellow but now clear.  He is going to monitor this.  He is up-to-date with his COVID vaccines.  He is no longer following with Dr. 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  He is on anticoagulation for his PE in June 2021.    CT Chest data  No results found.    PFT  No flowsheet data found.  OV 07/26/2021  Subjective:  Patient ID: Francisco [name], male , DOB: 1945-11-28 , age 59 y.o. , MRN: [id number] , ADDRESS: [address] [place] [address] PCP Shelva Majestic, MD Patient Care Team: Shelva Majestic, MD as PCP - General (Family Medicine) Kalman Shan, MD as Consulting Physician (Pulmonary Disease) Arminda Resides, MD as Consulting Physician (Dermatology) Dr. Mickie Hillier, DDS (Dentistry)  This Provider for this visit: Treatment Team:  Attending Provider: Kalman Shan, MD    07/26/2021 -   Chief Complaint  Patient presents with   Follow-up    Pt is here to discuss results of recent CT.    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 -follows for his COPD.  Presents with his wife.  He continues on his oxygen at 4 L.  He feels he is beginning to have a COPD exacerbation.  He did have a CT scan of the chest that was routine.  It shows a 9 mm right lower lobe superior segment nodule.  I looked at it it looks solid and lobulated.  I think there is a fair probability greater than 65% this is malignancy.  He is asymptomatic from it.  He just wants preemptive antibiotics and prednisone burst for his incipient COPD exacerbation.  In terms of his pulmonary  embolism from 2 and half years ago he continues his Eliquis.  The Eliquis at low-dose for prophylaxis.  His D-dimer in the past has been normal.  But we decided to continue the low-dose Eliquis.  We will check his D-dimer again.    CAT Score 04/26/2021 08/31/2020 05/03/2020 11/11/2019  Total CAT Score 31 23 16  32        CT chest 07/10/21  Narrative & Impression  CLINICAL DATA:  COPD exacerbation. Respiratory failure with hypoxia.   EXAM: CT CHEST WITHOUT CONTRAST   TECHNIQUE: Multidetector CT imaging of the chest was performed following the standard protocol without IV contrast.   COMPARISON:  04/25/2020   FINDINGS: Cardiovascular: The heart size is within normal limits. No pericardial effusion. Aortic atherosclerosis and coronary artery calcifications.   Mediastinum/Nodes: No enlarged mediastinal or axillary lymph nodes. Thyroid gland, trachea, and esophagus demonstrate no significant findings.   Lungs/Pleura: Severe changes of centrilobular and paraseptal emphysema. Diffuse bronchial wall thickening. No pleural effusion or acute airspace consolidation. No interstitial edema. Scarring and masslike architectural distortion within the right apex appears unchanged from the previous exam. Within the superior segment of right lower lobe there is a lung nodule measuring 1.1 x 0.6 cm (mean diameter 9 mm), image 82/3. New compared with the previous exam.  Upper Abdomen: No acute findings within the imaged portions of the upper abdomen. Aortic atherosclerosis.   Musculoskeletal: Curvature of the thoracic spine is convex towards the right. Compression fractures are again noted involving T6, T7 and T8. Unchanged from previous exam.   IMPRESSION: 1. No acute cardiopulmonary abnormalities. 2. There is a new pulmonary nodule within the superior segment of right lower lobe with a mean diameter of 9 mm. Consider a non-contrast Chest CT at 3 months, a PET/CT, or tissue  sampling. These guidelines do not apply to immunocompromised patients and patients with cancer. Follow up in patients with significant comorbidities as clinically warranted. For lung cancer screening, adhere to Lung-RADS guidelines. Reference: Radiology. 2017; 284(1):228-43. 3. Stable scarring and masslike architectural distortion within the right apex. 4. Diffuse bronchial wall thickening with emphysema, as above; imaging findings suggestive of underlying COPD. 5. Stable thoracic compression fractures. 6. Aortic Atherosclerosis (ICD10-I70.0) and Emphysema (ICD10-J43.9).     Electronically Signed   By: Signa Kell M.D.   On: 07/10/2021 13:33       09/11/2021 Pt. Presents for follow up. He had Flexible video fiberoptic bronchoscopy with robotic assistance and biopsies on 09/04/2021. He states he has done well after his biopsy. No significant bleeding. Just a scan amount the day after. He did have a lot of rib pain , that has subsequently improved. I gave the patient the biopsy results. We discussed this in full to include treatment options and all questions were asked and answered. [name] Kitchen   He says he is currently in the middle of a COPD flare. He has thick yellow green secretions .He also endorses a bad cough.  He does have a flutter valve. He does endorse some wheezing.He resumed his Eliquis on 09/06/2021.We will treat him for his COPD flare today.He is compliant with his Daliresp, Spiriva and Symbicort.    I have referred to radiation oncology, as he is oxygen dependent and in a wheelchair, SBRT is best option for this single nodule. . Both he and his wife had  questions that were answered. I have asked them to call the office if they need anything at all.   Test Results: Cytology 09/04/2021 MICROSCOPIC DIAGNOSIS:  A. 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  Immunohistochemistry shows  patchy positivity with cytokeratin 5/6 and weak positivity with CD56.  The malignant cells are negative with TTF-1, chromogranin,  synaptophysin, p40 and p63.  Ki-67 shows a high proliferation rate.   PET scan 08/08/2021 Solitary hypermetabolic pulmonary nodule in the RIGHT lower lobe with intense metabolic activity is most consistent PRIMARY BRONCHOGENIC CARCINOMA. 2. No mediastinal adenopathy, supraclavicular adenopathy or distant metastatic disease. No skeletal metastasis.     OV 12/05/2021  Subjective:  Patient ID: Francisco [name], male , DOB: January 10, 1946 , age 34 y.o. , MRN: [id number] , ADDRESS: [address] [place] [id number] PCP Shelva Majestic, MD Patient Care Team: Shelva Majestic, MD as PCP - General (Family Medicine) Kalman Shan, MD as Consulting Physician (Pulmonary Disease) Arminda Resides, MD as Consulting Physician (Dermatology) Dr. Mickie Hillier, DDS (Dentistry)  This Provider for this visit: Treatment Team:  Attending Provider: Kalman Shan, MD    12/05/2021 -   Chief Complaint  Patient presents  with   Follow-up    Pt states since his last radiation treatment, his breathing has become a little worse. States his cough is about the same.    HPI Francisco [name] 77 y.o. -return to see me.  I presents with his wife.  I am seeing him for the first time after the diagnose of cancer.  He said radiation 3 times.  He says after the radiation he is a little bit more short of breath.  He is now needing 4 L nasal cannula.  When he exerts he can desaturate into the 80s and sometimes into the 70s.  He is able to recover with resting.  His amount of cough and sputum is around the same.  It is between clear and green and yellow.  He is not in a COPD exacerbation currently but according to his wife it appears that he might get  one by next week.  She says whenever the antibiotic cephalosporin is on and prednisone he is doing great but otherwise he will have a flareup.  He is already on azithromycin 3 times a week and also Roflumilast for prevention of COPD flareup.  Despite this he gets recurrent COPD flareup.  Reviewed his eosinophilic profile.  Prior to prednisone some of the eosinophils were as high as 300 cells per cubic millimeter.  We did discuss about the fact that Dupixent might be effective but is eosinophilic count can be obliterated by the prednisone.  He is willing to try this if he has eosinophilia or if you are able to get insurance justification for COPD/asthma overlap.  He does behave like COPD and asthma overlap.  Currently his medications include Symbicort, Spiriva, Daliresp, azithromycin, Flonase and oxygen  He and his wife are requesting a prescription for COPD exacerbation be sent   He has upcoming appointment Dr. Arbutus Ped.  He does not think he will be a good candidate for chemotherapy CT Chest data  No results found.      OV 03/07/2022  Subjective:  Patient ID: Francisco [name], male , DOB: 08/02/1945 , age 14 y.o. , MRN: [id number] , ADDRESS: [address] [place] [facility] [id number] PCP Shelva Majestic, MD Patient Care Team: Shelva Majestic, MD as PCP - General (Family Medicine) Kalman Shan, MD as Consulting Physician (Pulmonary Disease) Arminda Resides, MD as Consulting Physician (Dermatology) Dr. Mickie Hillier, DDS (Dentistry)  This Provider for this visit: Treatment Team:  Attending Provider: Kalman Shan, MD  03/07/2022 -   Chief Complaint  Patient presents with   Follow-up    Pt states he has been doing about the same since last visit.     HPI Francisco [name] 77 y.o. -returns for follow-up of his COPD.  Since last visit no flareups.  He is doing stable.  Overall the same.  Continues all his medications.  Ordered blood gas last time but for some reason it was not  done.  We did blood eosinophils and it was 200 cells per cubic millimeter suppressed by prednisone.  He did not qualify fo dupixwnr.  I recommended we do this again and he is willing.  He has upcoming labs with Dr. Tana Conch March 14, 2022.  I have sent a secure chat to Dr. Durene Cal to get this done.  We talked about getting the full vaccines of RSV, flu and COVID.  He plans to do that.  No new issues.  His wife is here with him.   OV 10/08/2022  Subjective:  Patient ID: Francisco [name], male , DOB: 04/17/1946 , age 23 y.o. , MRN: [id number] , ADDRESS: [address] [place] [id number] PCP Shelva Majestic, MD Patient Care Team: Shelva Majestic, MD as PCP - General (Family Medicine) Kalman Shan, MD as Consulting Physician (Pulmonary Disease) Arminda Resides, MD as Consulting Physician (Dermatology) Dr. Mickie Hillier, DDS (Dentistry)  This Provider for this visit: Treatment Team:  Attending Provider: Kalman Shan, MD    10/08/2022 -   Chief Complaint  Patient presents with   Follow-up    Pt is here for follow up for copd. Pt is on Symbicort and Sprivia daily and Albuterol as needed. Pt states that these are working well for him with his copd. Pt is on 4L of oxygen pulsed and is workinf well.      HPI Francisco [name] 77 y.o. -returns for follow-up.  Since his last visit he has had admission to the hospital for intestinal issues and bleeding.  During this time this was in February 2024 his blood eosinophils did go up and he meets criteria for Dupixent.  He then went to a nursing home and apparently had pneumonia.  I do not have those films with me..  In the interim he is also had CT scan of the chest in December 2023 and it shows no evidence of tumor recurrence.  I personally visualized the films.  He feels he is got a COPD exacerbation coming along right now for the last few days with brown sputum he wants another course of antibiotics and prednisone.  We did discuss  Dupixent and he wants to do it.  His wife is an independent historian today and she affirms the same.  Last visit I did recommend Hill ROM event 2000 system but he does not want to do it.  This because it is very inconvenient for him.  Otherwise he continues his regular medications including oxygen prednisone 10 mg azithromycin and low-dose Eliquis.    OV 03/14/2023  Subjective:  Patient ID: Francisco [name], male , DOB: [birth year] , age 64 y.o. , MRN: [id number] , ADDRESS: [address] [place] [facility] [id number] PCP Shelva Majestic, MD Patient Care Team: Shelva Majestic, MD as PCP - General (Family Medicine) Kalman Shan, MD as Consulting Physician (Pulmonary Disease) Arminda Resides, MD as Consulting Physician (Dermatology) Dr. Mickie Hillier, DDS (Dentistry)  This Provider for this visit: Treatment Team:  Attending Provider: Kalman Shan, MD    03/14/2023 -   Chief Complaint  Patient presents with   Follow-up    Pt states he is coughing up a lot of brown phlegm x 3 weeks     HPI Francisco [name] 77 y.o. -returns for follow-up.  Presents with his wife.  After the last visit he got approved for Dupixent but he is rejected it because of the side effect concern.  I went over this again with him today but he is steadfast that he does not want to do it.  At the same time is reporting incipient exacerbation for the last 3 weeks with increasing cough and sputum production sputum is brown in color.  He feels he will benefit from antibiotic and prednisone.  Otherwise no changes.  Last CT scan of the chest June 2024 without any lung cancer recurrence.   Past medical history - Admitted for 2 days mid August 2024 with syncope.  Has a ulcer in his left lower extremity  PFT      No data to display            LAB RESULTS last 96 hours No results found.  LAB RESULTS last 90 days Recent Results (from the past 2160 hour(s))  CMP (Cancer Center only)     Status: Abnormal    Collection Time: 12/24/22 10:06 AM  Result Value Ref Range   Sodium 142 135 - 145 mmol/L   Potassium 4.6 3.5 - 5.1 mmol/L   Chloride 98 98 - 111 mmol/L   CO2 38 (H) 22 - 32 mmol/L   Glucose, Bld 122 (H) 70 - 99 mg/dL    Comment: Glucose reference range applies only to samples taken after fasting for at least 8 hours.   BUN 13 8 - 23 mg/dL   Creatinine 9.62 9.52 - 1.24 mg/dL   Calcium 9.9 8.9 - 84.1 mg/dL   Total Protein 6.1 (L) 6.5 - 8.1 g/dL   Albumin 3.7 3.5 - 5.0 g/dL   AST 23 15 - 41 U/L   ALT 15 0 - 44 U/L   Alkaline Phosphatase 55 38 - 126 U/L   Total Bilirubin 0.5 0.3 - 1.2 mg/dL   GFR, Estimated >32 >44 mL/min    Comment: (NOTE) Calculated using the CKD-EPI Creatinine Equation (2021)    Anion gap 6 5 - 15    Comment: Performed at [facility], [address] [place], [place] [address]  CBC with Differential (Cancer Center Only)     Status: Abnormal   Collection Time: 12/24/22 10:06 AM  Result Value Ref Range   WBC Count 11.6 (H) 4.0 - 10.5 K/uL   RBC 3.51 (L) 4.22 - 5.81 MIL/uL   Hemoglobin 11.1 (L) 13.0 - 17.0 g/dL   HCT 25.3 (L) 66.4 - 40.3 %   MCV 98.9 80.0 - 100.0 fL   MCH 31.6 26.0 - 34.0 pg   MCHC 32.0 30.0 - 36.0 g/dL   RDW 47.4 25.9 - 56.3 %   Platelet Count 156 150 - 400 K/uL   nRBC 0.0 0.0 - 0.2 %   Neutrophils Relative % 87 %   Neutro Abs 10.2 (H) 1.7 - 7.7 K/uL   Lymphocytes Relative 5 %   Lymphs Abs 0.6 (L) 0.7 - 4.0 K/uL   Monocytes Relative 5 %   Monocytes Absolute 0.6 0.1 - 1.0 K/uL   Eosinophils Relative 2 %   Eosinophils Absolute 0.2 0.0 - 0.5 K/uL   Basophils Relative 0 %   Basophils Absolute 0.0 0.0 - 0.1 K/uL   Immature Granulocytes 1 %   Abs Immature Granulocytes 0.08 (H) 0.00 - 0.07 K/uL    Comment: Performed at [facility], [address], [place], [place] [address]  CBC with Differential     Status: Abnormal   Collection Time: 02/20/23  4:17 PM  Result Value Ref Range   WBC 10.4  4.0 - 10.5 K/uL   RBC 3.49 (L) 4.22 - 5.81 MIL/uL   Hemoglobin 10.5 (L) 13.0 - 17.0 g/dL   HCT 33.2 (L) 95.1 - 88.4 %   MCV 96.8 80.0 - 100.0 fL   MCH 30.1 26.0 - 34.0 pg   MCHC 31.1 30.0 - 36.0 g/dL   RDW 16.6 06.3 - 01.6 %   Platelets 210 150 - 400 K/uL   nRBC 0.0 0.0 - 0.2 %   Neutrophils Relative % 88 %   Neutro Abs 9.1 (H) 1.7 - 7.7 K/uL  Lymphocytes Relative 4 %   Lymphs Abs 0.5 (L) 0.7 - 4.0 K/uL   Monocytes Relative 7 %   Monocytes Absolute 0.7 0.1 - 1.0 K/uL   Eosinophils Relative 0 %   Eosinophils Absolute 0.0 0.0 - 0.5 K/uL   Basophils Relative 0 %   Basophils Absolute 0.0 0.0 - 0.1 K/uL   Immature Granulocytes 1 %   Abs Immature Granulocytes 0.07 0.00 - 0.07 K/uL    Comment: Performed at [facility], [address] [place], [place] [address]  Comprehensive metabolic panel     Status: Abnormal   Collection Time: 02/20/23  4:17 PM  Result Value Ref Range   Sodium 141 135 - 145 mmol/L   Potassium 4.1 3.5 - 5.1 mmol/L   Chloride 92 (L) 98 - 111 mmol/L   CO2 34 (H) 22 - 32 mmol/L   Glucose, Bld 104 (H) 70 - 99 mg/dL    Comment: Glucose reference range applies only to samples taken after fasting for at least 8 hours.   BUN 7 (L) 8 - 23 mg/dL   Creatinine, Ser 1.91 0.61 - 1.24 mg/dL   Calcium 9.4 8.9 - 47.8 mg/dL   Total Protein 6.3 (L) 6.5 - 8.1 g/dL   Albumin 3.4 (L) 3.5 - 5.0 g/dL   AST 26 15 - 41 U/L   ALT 20 0 - 44 U/L   Alkaline Phosphatase 49 38 - 126 U/L   Total Bilirubin 0.3 0.3 - 1.2 mg/dL   GFR, Estimated >29 >56 mL/min    Comment: (NOTE) Calculated using the CKD-EPI Creatinine Equation (2021)    Anion gap 15 5 - 15    Comment: Performed at [facility], [address] [place], [place] [address]  Brain natriuretic peptide     Status: None   Collection Time: 02/20/23  4:17 PM  Result Value Ref Range   B Natriuretic Peptide 86.0 0.0 - 100.0 pg/mL    Comment: Performed at [facility], [address], [place], [place] [address]   Troponin I (High Sensitivity)     Status: None   Collection Time: 02/20/23  4:17 PM  Result Value Ref Range   Troponin I (High Sensitivity) 12 <18 ng/L    Comment: (NOTE) Elevated high sensitivity troponin I (hsTnI) values and significant  changes across serial measurements may suggest ACS but many other  chronic and acute conditions are known to elevate hsTnI results.  Refer to the "Links" section for chest pain algorithms and additional  guidance. Performed at [facility], [address] [place], [place] [address]   SARS Coronavirus 2 by RT PCR (hospital order, performed in [facility] hospital lab) *cepheid single result test* Anterior Nasal Swab     Status: None   Collection Time: 02/21/23  1:10 AM   Specimen: Anterior Nasal Swab  Result Value Ref Range   SARS Coronavirus 2 by RT PCR NEGATIVE NEGATIVE    Comment: Performed at [facility], [address], [place], [place] [address]  Expectorated Sputum Assessment w Gram Stain, Rflx to Resp Cult     Status: None   Collection Time: 02/21/23  2:20 AM   Specimen: Sputum  Result Value Ref Range   Specimen Description SPUTUM    Special Requests NONE    Sputum evaluation      THIS SPECIMEN IS ACCEPTABLE FOR SPUTUM CULTURE Performed at [facility], [address], [place], [place] [address]  Report Status 02/21/2023 FINAL   Culture, Respiratory w Gram Stain     Status: None   Collection Time: 02/21/23  2:20 AM   Specimen: SPU  Result Value Ref Range   Specimen Description SPUTUM    Special Requests NONE Reflexed from [id number]    Gram Stain      RARE WBC PRESENT, PREDOMINANTLY PMN ABUNDANT GRAM NEGATIVE RODS ABUNDANT GRAM POSITIVE RODS RARE GRAM POSITIVE COCCI IN PAIRS Performed at [facility], [address], [place], [place] [address]    Culture ABUNDANT PSEUDOMONAS AERUGINOSA    Report Status 02/24/2023 FINAL    Organism ID, Bacteria PSEUDOMONAS AERUGINOSA       Susceptibility   Pseudomonas  aeruginosa - MIC*    CEFTAZIDIME <=1 SENSITIVE Sensitive     CIPROFLOXACIN <=0.25 SENSITIVE Sensitive     GENTAMICIN <=1 SENSITIVE Sensitive     IMIPENEM <=0.25 SENSITIVE Sensitive     PIP/TAZO <=4 SENSITIVE Sensitive     CEFEPIME <=0.12 SENSITIVE Sensitive     * ABUNDANT PSEUDOMONAS AERUGINOSA  Glucose, capillary     Status: None   Collection Time: 02/21/23  4:44 AM  Result Value Ref Range   Glucose-Capillary 95 70 - 99 mg/dL    Comment: Glucose reference range applies only to samples taken after fasting for at least 8 hours.  Urinalysis, Routine w reflex microscopic -Urine, Clean Catch     Status: Abnormal   Collection Time: 02/21/23  7:10 AM  Result Value Ref Range   Color, Urine YELLOW YELLOW   APPearance CLEAR CLEAR   Specific Gravity, Urine 1.012 1.005 - 1.030   pH 7.0 5.0 - 8.0   Glucose, UA NEGATIVE NEGATIVE mg/dL   Hgb urine dipstick NEGATIVE NEGATIVE   Bilirubin Urine NEGATIVE NEGATIVE   Ketones, ur 5 (A) NEGATIVE mg/dL   Protein, ur NEGATIVE NEGATIVE mg/dL   Nitrite NEGATIVE NEGATIVE   Leukocytes,Ua NEGATIVE NEGATIVE    Comment: Performed at [facility], [address] [place], [place] [address]  Magnesium     Status: None   Collection Time: 02/21/23  8:35 AM  Result Value Ref Range   Magnesium 1.8 1.7 - 2.4 mg/dL    Comment: Performed at [facility], [address], [place], [place] [address]  TSH     Status: None   Collection Time: 02/21/23  8:35 AM  Result Value Ref Range   TSH 1.055 0.350 - 4.500 uIU/mL    Comment: Performed by a 3rd Generation assay with a functional sensitivity of <=0.01 uIU/mL. Performed at [facility], [address] [place], [place] [address]   CBC     Status: Abnormal   Collection Time: 02/21/23  8:35 AM  Result Value Ref Range   WBC 9.7 4.0 - 10.5 K/uL   RBC 3.22 (L) 4.22 - 5.81 MIL/uL   Hemoglobin 9.8 (L) 13.0 - 17.0 g/dL   HCT 57.3 (L) 22.0 - 25.4 %   MCV 96.6 80.0 - 100.0 fL   MCH 30.4 26.0 - 34.0 pg    MCHC 31.5 30.0 - 36.0 g/dL   RDW 27.0 62.3 - 76.2 %   Platelets 173 150 - 400 K/uL   nRBC 0.0 0.0 - 0.2 %    Comment: Performed at [facility], [address], [place], [place] [address]  Troponin I (High Sensitivity)     Status: None   Collection Time: 02/21/23  8:35 AM  Result Value Ref Range  Troponin I (High Sensitivity) 17 <18 ng/L    Comment: (NOTE) Elevated high sensitivity troponin I (hsTnI) values and significant  changes across serial measurements may suggest ACS but many other  chronic and acute conditions are known to elevate hsTnI results.  Refer to the "Links" section for chest pain algorithms and additional  guidance. Performed at [facility], [address], [place], [place] [address]   Lipid panel     Status: None   Collection Time: 02/21/23  8:35 AM  Result Value Ref Range   Cholesterol 168 0 - 200 mg/dL   Triglycerides 62 <191 mg/dL   HDL 83 >47 mg/dL   Total CHOL/HDL Ratio 2.0 RATIO   VLDL 12 0 - 40 mg/dL   LDL Cholesterol 73 0 - 99 mg/dL    Comment:        Total Cholesterol/HDL:CHD Risk Coronary Heart Disease Risk Table                     Men   Women  1/2 Average Risk   3.4   3.3  Average Risk       5.0   4.4  2 X Average Risk   9.6   7.1  3 X Average Risk  23.4   11.0        Use the calculated Patient Ratio above and the CHD Risk Table to determine the patient's CHD Risk.        ATP III CLASSIFICATION (LDL):  <100     mg/dL   Optimal  829-562  mg/dL   Near or Above                    Optimal  130-159  mg/dL   Borderline  130-865  mg/dL   High  >784     mg/dL   Very High Performed at [facility], [address] [place], [place] [address]   Basic metabolic panel     Status: Abnormal   Collection Time: 02/21/23  8:35 AM  Result Value Ref Range   Sodium 137 135 - 145 mmol/L   Potassium 4.4 3.5 - 5.1 mmol/L   Chloride 92 (L) 98 - 111 mmol/L   CO2 33 (H) 22 - 32 mmol/L   Glucose, Bld 140 (H) 70 - 99 mg/dL    Comment:  Glucose reference range applies only to samples taken after fasting for at least 8 hours.   BUN 15 8 - 23 mg/dL   Creatinine, Ser 5.28 0.61 - 1.24 mg/dL   Calcium 8.7 (L) 8.9 - 10.3 mg/dL   GFR, Estimated >41 >32 mL/min    Comment: (NOTE) Calculated using the CKD-EPI Creatinine Equation (2021)    Anion gap 12 5 - 15    Comment: Performed at [facility], [address] [place], [place] [address]  ECHOCARDIOGRAM COMPLETE     Status: None   Collection Time: 02/21/23 12:37 PM  Result Value Ref Range   Weight 2,670.21 oz   Height 72 in   BP 132/80 mmHg   Single Plane A2C EF 55.4 %   Single Plane A4C EF 60.3 %   Calc EF 59.3 %   S' Lateral 3.50 cm   AR max vel 1.68 cm2   AV Mean grad 6.7 mmHg   AV Peak grad 13.2 mmHg   Ao pk vel 1.82 m/s   Area-P 1/2 6.54 cm2   Est EF 55 - 60%  AV Area mean vel 1.76 cm2   AV Area VTI 1.75 cm2  Renal function panel     Status: Abnormal   Collection Time: 02/22/23  1:54 AM  Result Value Ref Range   Sodium 140 135 - 145 mmol/L   Potassium 4.2 3.5 - 5.1 mmol/L   Chloride 98 98 - 111 mmol/L   CO2 31 22 - 32 mmol/L   Glucose, Bld 119 (H) 70 - 99 mg/dL    Comment: Glucose reference range applies only to samples taken after fasting for at least 8 hours.   BUN 11 8 - 23 mg/dL   Creatinine, Ser 3.29 0.61 - 1.24 mg/dL   Calcium 8.5 (L) 8.9 - 10.3 mg/dL   Phosphorus 3.6 2.5 - 4.6 mg/dL   Albumin 2.9 (L) 3.5 - 5.0 g/dL   GFR, Estimated >51 >88 mL/min    Comment: (NOTE) Calculated using the CKD-EPI Creatinine Equation (2021)    Anion gap 11 5 - 15    Comment: Performed at [facility], [address] [place], [place] [address]  Magnesium     Status: None   Collection Time: 02/22/23  1:54 AM  Result Value Ref Range   Magnesium 1.9 1.7 - 2.4 mg/dL    Comment: Performed at [facility], [address] [place], [place] [address]  CBC with Differential/Platelet     Status: Abnormal   Collection Time: 02/22/23  1:54 AM  Result  Value Ref Range   WBC 7.4 4.0 - 10.5 K/uL   RBC 3.01 (L) 4.22 - 5.81 MIL/uL   Hemoglobin 9.1 (L) 13.0 - 17.0 g/dL   HCT 01.0 (L) 93.2 - 35.5 %   MCV 96.3 80.0 - 100.0 fL   MCH 30.2 26.0 - 34.0 pg   MCHC 31.4 30.0 - 36.0 g/dL   RDW 73.2 20.2 - 54.2 %   Platelets 173 150 - 400 K/uL   nRBC 0.0 0.0 - 0.2 %   Neutrophils Relative % 90 %   Neutro Abs 6.6 1.7 - 7.7 K/uL   Lymphocytes Relative 5 %   Lymphs Abs 0.4 (L) 0.7 - 4.0 K/uL   Monocytes Relative 4 %   Monocytes Absolute 0.3 0.1 - 1.0 K/uL   Eosinophils Relative 0 %   Eosinophils Absolute 0.0 0.0 - 0.5 K/uL   Basophils Relative 0 %   Basophils Absolute 0.0 0.0 - 0.1 K/uL   Immature Granulocytes 1 %   Abs Immature Granulocytes 0.06 0.00 - 0.07 K/uL    Comment: Performed at [facility], [address], [place], [place] [address]  Glucose, capillary     Status: Abnormal   Collection Time: 02/22/23  5:37 AM  Result Value Ref Range   Glucose-Capillary 121 (H) 70 - 99 mg/dL    Comment: Glucose reference range applies only to samples taken after fasting for at least 8 hours.  Type and screen [facility]     Status: None   Collection Time: 02/22/23 10:59 AM  Result Value Ref Range   ABO/RH(D) A POS    Antibody Screen NEG    Sample Expiration      02/25/2023,2359 Performed at [facility], [address], [place], [place] [address]   CBC     Status: Abnormal   Collection Time: 02/22/23 11:01 AM  Result Value Ref Range   WBC 10.6 (H) 4.0 - 10.5 K/uL   RBC 3.05 (L) 4.22 - 5.81 MIL/uL  Hemoglobin 9.6 (L) 13.0 - 17.0 g/dL   HCT 16.1 (L) 09.6 - 04.5 %   MCV 99.7 80.0 - 100.0 fL   MCH 31.5 26.0 - 34.0 pg   MCHC 31.6 30.0 - 36.0 g/dL   RDW 40.9 81.1 - 91.4 %   Platelets 201 150 - 400 K/uL   nRBC 0.0 0.0 - 0.2 %    Comment: Performed at [facility], [address] [place], [place] [address]  Comprehensive metabolic panel     Status: Abnormal   Collection Time: 03/03/23  4:24 PM  Result Value  Ref Range   Sodium 142 135 - 145 mEq/L   Potassium 4.9 3.5 - 5.1 mEq/L   Chloride 94 (L) 96 - 112 mEq/L   CO2 42 (H) 19 - 32 mEq/L   Glucose, Bld 93 70 - 99 mg/dL   BUN 17 6 - 23 mg/dL   Creatinine, Ser 6.21 0.40 - 1.50 mg/dL   Total Bilirubin 0.4 0.2 - 1.2 mg/dL   Alkaline Phosphatase 58 39 - 117 U/L   AST 25 0 - 37 U/L   ALT 19 0 - 53 U/L   Total Protein 6.5 6.0 - 8.3 g/dL   Albumin 3.7 3.5 - 5.2 g/dL   GFR 30.86 >57.84 mL/min    Comment: Calculated using the CKD-EPI Creatinine Equation (2021)   Calcium 9.6 8.4 - 10.5 mg/dL  CBC with Differential/Platelet     Status: Abnormal   Collection Time: 03/03/23  4:24 PM  Result Value Ref Range   WBC 10.4 4.0 - 10.5 K/uL   RBC 3.49 (L) 4.22 - 5.81 Mil/uL   Hemoglobin 10.7 (L) 13.0 - 17.0 g/dL   HCT 69.6 (L) 29.5 - 28.4 %   MCV 95.4 78.0 - 100.0 fl   MCHC 32.1 30.0 - 36.0 g/dL   RDW 13.2 44.0 - 10.2 %   Platelets 234.0 150.0 - 400.0 K/uL   Neutrophils Relative % 85.2 Repeated and verified X2. (H) 43.0 - 77.0 %   Lymphocytes Relative 6.7 (L) 12.0 - 46.0 %   Monocytes Relative 7.0 3.0 - 12.0 %   Eosinophils Relative 0.7 0.0 - 5.0 %   Basophils Relative 0.4 0.0 - 3.0 %   Neutro Abs 8.8 (H) 1.4 - 7.7 K/uL   Lymphs Abs 0.7 0.7 - 4.0 K/uL   Monocytes Absolute 0.7 0.1 - 1.0 K/uL   Eosinophils Absolute 0.1 0.0 - 0.7 K/uL   Basophils Absolute 0.0 0.0 - 0.1 K/uL  IBC + Ferritin     Status: Abnormal   Collection Time: 03/03/23  4:24 PM  Result Value Ref Range   [name] 30 (L) 42 - 165 ug/dL   Transferrin 725.3 664.4 - 360.0 mg/dL   Saturation Ratios 6.9 (L) 20.0 - 50.0 %   Ferritin 27.6 22.0 - 322.0 ng/mL   TIBC 436.8 250.0 - 450.0 mcg/dL  HgB I3K     Status: None   Collection Time: 03/03/23  4:24 PM  Result Value Ref Range   Hgb A1c MFr Bld 5.3 4.6 - 6.5 %    Comment: Glycemic Control Guidelines for People with Diabetes:Non Diabetic:  <6%Goal of Therapy: <7%Additional Action Suggested:  >8%          has a past medical history of  Chronic back pain, Chronic rhinitis, Compressed spine fracture (HCC), COPD (chronic obstructive pulmonary disease) (HCC), Dyspnea, Emphysema lung (HCC), On home oxygen therapy, Onychomycosis, Orthostatic hypotension, PAD (peripheral artery disease) (HCC), Pneumonia, Pulmonary embolism (HCC) (  04/07/2018), Skin cancer, Small cell lung cancer, right (HCC) (2023), and Vertigo.   reports that he quit smoking about 5 years ago. His smoking use included pipe and cigarettes. He started smoking about 57 years ago. He has a 104 pack-year smoking history. He has never been exposed to tobacco smoke. He has never used smokeless tobacco.  Past Surgical History:  Procedure Laterality Date   ABDOMINAL AORTOGRAM W/LOWER EXTREMITY N/A 01/21/2020   Procedure: ABDOMINAL AORTOGRAM W/LOWER EXTREMITY;  Surgeon: Sherren Kerns, MD;  [facility]: [facility] INVASIVE CV LAB;  Service: Cardiovascular;  Laterality: N/A;   APPENDECTOMY     BIOPSY  02/20/2020   Procedure: BIOPSY;  Surgeon: Meridee Score Netty Starring., MD;  Location: [facility] ENDOSCOPY;  Service: Gastroenterology;;   BIOPSY  08/12/2022   Procedure: BIOPSY;  Surgeon: Shellia Cleverly, DO;  Location: [facility] ENDOSCOPY;  Service: Gastroenterology;;   BRONCHIAL BIOPSY  09/04/2021   Procedure: BRONCHIAL BIOPSIES;  Surgeon: Josephine Igo, DO;  Location: [facility] ENDOSCOPY;  Service: Pulmonary;;   BRONCHIAL BRUSHINGS  09/04/2021   Procedure: BRONCHIAL BRUSHINGS;  Surgeon: Josephine Igo, DO;  Location: [facility] ENDOS[facility];  Service: Pulmonary;;   BRONCHIAL NEEDLE ASPIRATION BIOPSY  09/04/2021   Procedure: BRONCHIAL NEEDLE ASPIRATION BIOPSIES;  Surgeon: Josephine Igo, DO;  Location: [facility] ENDOSCOPY;  Service: Pulmonary;;   CATARACT EXTRACTION, BILATERAL     ENDARTERECTOMY FEMORAL Right 04/19/2019   Procedure: ENDARTERECTOMY RIGHT COMMON FEMORAL;  Surgeon: Sherren Kerns, MD;  Location: [facility] OR;  Service: Vascular;  Laterality: Right;   ENDARTERECTOMY FEMORAL Left 01/24/2020   Procedure: LEFT FEMORAL  ENDARTERECTOMY WITH DACRON PATCH ANGIOPLASTY;  Surgeon: Sherren Kerns, MD;  Location: [facility] OR;  Service: Vascular;  Laterality: Left;   ESOPHAGOGASTRODUODENOSCOPY (EGD) WITH PROPOFOL N/A 02/20/2020   Procedure: ESOPHAGOGASTRODUODENOSCOPY (EGD) WITH PROPOFOL;  Surgeon: Lemar Lofty., MD;  Location: [facility] ENDOSCOPY;  Service: Gastroenterology;  Laterality: N/A;   ESOPHAGOGASTRODUODENOSCOPY (EGD) WITH PROPOFOL N/A 08/12/2022   Procedure: ESOPHAGOGASTRODUODENOSCOPY (EGD) WITH PROPOFOL;  Surgeon: Shellia Cleverly, DO;  Location[facility]OS[facility];  Service: Gastroenterology;  Laterality: N/A;   FEMORAL ENDARTERECTOMY Left 01/24/2020   FIDUCIAL MARKER PLACEMENT  09/04/2021   Procedure: FIDUCIAL MARKER PLACEMENT;  Surgeon: Josephine Igo, DO;  Location: [facility] ENDOS[facility];  Service: Pulmonary;;   HEMOSTASIS CLIP PLACEMENT  02/20/2020   Procedure: HEMOSTASIS CLIP PLACEMENT;  Surgeon: Lemar Lofty., MD;  Location: [facility] ENDOSCOPY;  Service: Gastroenterology;;   HEMOSTASIS CLIP PLACEMENT  08/12/2022   Procedure: HEMOSTASIS CLIP PLACEMENT;  Surgeon: Shellia Cleverly, DO;  Location: [facility];  Service: Gastroenterology;;   HOT HEMOSTASIS N/A 02/20/2020   Procedure: HOT HEMOSTASIS (ARGON PLASMA COAGULATION/BICAP);  Surgeon: Lemar Lofty., MD;  Location: [facility] ENDOSCOPY;  Service: Gastroenterology;  Laterality: N/A;   HOT HEMOSTASIS N/A 08/12/2022   Procedure: HOT HEMOSTASIS (ARGON PLASMA COAGULATION/BICAP);  Surgeon: Shellia Cleverly, DO;  Location: [facility] ENDOSCOPY;  Service: Gastroenterology;  Laterality: N/A;   INSERTION OF ILIAC STENT Left 01/24/2020   Procedure: INSERTION OF VBX STENT 8X59 AND 8X39 LEFT COMMON ILIAC ARTERY. INSERTION OF INNOVA 7 X 60 INNOVA STENT LEFT EXTERNAL ILIAC ARTERY. ;  Surgeon: Sherren Kerns, MD;  Location: [facility] OR;  Service: Vascular;  Laterality: Left;   LOWER EXTREMITY ANGIOGRAPHY  12/11/2018   LOWER EXTREMITY ANGIOGRAPHY N/A 12/11/2018   Procedure: LOWER EXTREMITY  ANGIOGRAPHY;  Surgeon: Sherren Kerns, MD;  [facility];  Service: Cardiovascular;  Laterality: N/A;   PATCH ANGIOPLASTY Right 04/19/2019   Procedure: Patch Angioplasty;  Surgeon: Sherren Kerns, MD;  Location: [facility] OR;  Service: Vascular;  Laterality: Right;   PERIPHERAL VASCULAR INTERVENTION Right 12/11/2018   Procedure: PERIPHERAL VASCULAR INTERVENTION;  Surgeon: Sherren Kerns, MD;  Location: [facility] INVASIVE CV LAB;  Service: Cardiovascular;  Laterality: Right;  Common Iliac    SKIN CANCER EXCISION     "lips, face, ears, arms" (04/07/2018)   SUBMUCOSAL INJECTION  08/12/2022   Procedure: SUBMUCOSAL INJECTION;  Surgeon: Shellia Cleverly, DO;  Location: [facility] ENDOS[facility];  Service: Gastroenterology;;   TONSILLECTOMY     ULTRASOUND GUIDANCE FOR VASCULAR ACCESS Right 01/24/2020   Procedure: ULTRASOUND GUIDANCE FOR VASCULAR ACCESS;  Surgeon: Sherren Kerns, MD;  Location: [facility] OR;  Service: Vascular;  Laterality: Right;   VIDEO BRONCHOSCOPY WITH RADIAL ENDOBRONCHIAL ULTRASOUND  09/04/2021   Procedure: VIDEO BRONCHOSCOPY WITH RADIAL ENDOBRONCHIAL ULTRASOUND;  Surgeon: Josephine Igo, DO;  Location: [facility] ENDOSCOPY;  Service: Pulmonary;;    Allergies  Allergen Reactions   Tape Other (See Comments)    Skin tears easily.   Augmentin [Amoxicillin-Pot Clavulanate] Other (See Comments)    Thrush Sore throat Hoarse voice   Cipro [Ciprofloxacin Hcl] Nausea And Vomiting    Weakness Fatigue    Gadavist [Gadobutrol] Other (See Comments)    Unknown reaction   Gadolinium Rash    Itchy back rash   Levaquin [Levofloxacin] Other (See Comments)    Hallucinations    Lipitor [Atorvastatin] Rash    Immunization History  Administered Date(s) Administered   Fluad Quad(high Dose 65+) 03/16/2019, 03/10/2020   Influenza Split 04/24/2012   Influenza Whole 05/08/2009, 04/08/2011   Influenza, High Dose Seasonal PF 05/01/2017, 03/31/2018, 04/17/2021, 04/03/2022   Influenza,inj,Quad PF,6+ Mos  04/27/2013, 04/15/2014, 04/24/2015   Influenza,inj,quad, With Preservative 05/01/2018   Influenza-Unspecified 05/13/2016   PFIZER Comirnaty(Gray Top)Covid-19 Tri-Sucrose Vaccine 11/24/2020, 04/03/2022   PFIZER(Purple Top)SARS-COV-2 Vaccination 07/30/2019, 08/20/2019, 04/07/2020   Pfizer Covid-19 Vaccine Bivalent Booster 38yrs & up 03/16/2021   Pneumococcal Conjugate-13 02/25/2014   Pneumococcal Polysaccharide-23 08/15/2011, 12/19/2016   Pneumococcal-Unspecified 03/08/2017   Respiratory Syncytial Virus Vaccine,Recomb Aduvanted(Arexvy) 06/06/2022   Td 02/21/2010   Tdap 11/05/2017   Zoster, Live 01/29/2012    Family History  Problem Relation Age of Onset   Heart disease Mother        CABG in her 13s, nonsmoker   Cancer Mother    Stroke Father    Heart disease Father        Died of MI at age 47, smoker   Hepatitis Sister    Coronary artery disease Other        male 1st degree relative <60   Colon cancer Neg Hx    Pancreatic cancer Neg Hx    Esophageal cancer Neg Hx    Inflammatory bowel disease Neg Hx    Liver disease Neg Hx    Rectal cancer Neg Hx    Stomach cancer Neg Hx      Current Outpatient Medications:    acetaminophen (TYLENOL) 500 MG tablet, Take 500 mg by mouth every 4 (four) hours as needed for moderate pain, fever, headache or mild pain., Disp: , Rfl:    albuterol (PROVENTIL) (2.5 MG/3ML) 0.083% nebulizer solution, Take 3 mLs (2.5 mg total) by nebulization every 6 (six) hours as needed for wheezing or shortness of breath., Disp: 75 mL, Rfl: 12   azithromycin (ZITHROMAX) 250 MG tablet, Take 250 mg by mouth See admin instructions. Continuous M,W,F, Disp: , Rfl:    Calcium Carbonate-Vitamin D (CALCIUM 600+D PO), Take  2 tablets by mouth daily., Disp: , Rfl:    docusate sodium (COLACE) 100 MG capsule, Take 2 capsules (200 mg total) by mouth 2 (two) times daily. (Patient taking differently: Take 200 mg by mouth 2 (two) times daily. Patient uses PRN), Disp: 10 capsule,  Rfl: 0   doxycycline (VIBRA-TABS) 100 MG tablet, Take 1 tablet (100 mg total) by mouth 2 (two) times daily., Disp: 10 tablet, Rfl: 0   ELIQUIS 2.5 MG TABS tablet, TAKE 1 TABLET BY MOUTH TWICE  DAILY (Patient taking differently: Take 2.5 mg by mouth 2 (two) times daily.), Disp: 180 tablet, Rfl: 3   Ferrous Sulfate ([name]) 325 (65 Fe) MG TABS, Take 325 mg by mouth every evening., Disp: , Rfl:    fluticasone (FLONASE) 50 MCG/ACT nasal spray, USE 2 SPRAYS IN BOTH  NOSTRILS DAILY (Patient taking differently: Place 2 sprays into both nostrils daily.), Disp: 48 g, Rfl: 3   folic acid (FOLVITE) 1 MG tablet, Take 1 tablet (1 mg total) by mouth daily., Disp: , Rfl:    furosemide (LASIX) 20 MG tablet, TAKE 1 TABLET BY MOUTH DAILY AS  NEEDED FOR FLUID OR EDEMA (Patient taking differently: Take 20 mg by mouth daily.), Disp: 90 tablet, Rfl: 3   Multiple Vitamin (MULTIVITAMIN WITH MINERALS) TABS tablet, Take 1 tablet by mouth daily., Disp: , Rfl:    mupirocin cream (BACTROBAN) 2 %, Apply 1 Application topically 2 (two) times daily., Disp: 30 g, Rfl: 0   oxyCODONE (OXY IR/ROXICODONE) 5 MG immediate release tablet, Take 5 mg by mouth every 4 (four) hours as needed for severe pain., Disp: , Rfl:    OXYGEN, Inhale 4-5 L into the lungs continuous. , Disp: , Rfl:    pantoprazole (PROTONIX) 20 MG tablet, TAKE 1 TABLET BY MOUTH DAILY, Disp: 90 tablet, Rfl: 3   Polyethyl Glycol-Propyl Glycol (SYSTANE) 0.4-0.3 % SOLN, Place 1 drop into both eyes every other day., Disp: , Rfl:    predniSONE (DELTASONE) 10 MG tablet, TAKE 1 TABLET BY MOUTH DAILY  WITH BREAKFAST, Disp: 90 tablet, Rfl: 3   predniSONE (DELTASONE) 10 MG tablet, Take 4 tabs for 2 days, then 3 tabs for 2 days, 2 tabs for 2 days, then 1 tab for 2 days, then 10mg  daily baseline to continue, Disp: 20 tablet, Rfl: 0   roflumilast (DALIRESP) 500 MCG TABS tablet, TAKE 1 TABLET BY MOUTH DAILY, Disp: 90 tablet, Rfl: 3   rosuvastatin (CRESTOR) 20 MG tablet, Take 1 tablet (20  mg total) by mouth daily. (Patient taking differently: Take 20 mg by mouth every evening.), Disp: 90 tablet, Rfl: 3   SYMBICORT 160-4.5 MCG/ACT inhaler, USE 2 INHALATIONS BY MOUTH TWICE DAILY, Disp: 30.6 g, Rfl: 3   Tiotropium Bromide Monohydrate (SPIRIVA RESPIMAT) 2.5 MCG/ACT AERS, USE 2 INHALATIONS BY MOUTH ONCE  DAILY, Disp: 12 g, Rfl: 3   traMADol (ULTRAM) 50 MG tablet, Take 50 mg by mouth every 12 (twelve) hours as needed for severe pain., Disp: , Rfl:    predniSONE (DELTASONE) 10 MG tablet, Take 10 mg by mouth daily with breakfast., Disp: , Rfl:    predniSONE (DELTASONE) 50 MG tablet, Take one tab po 13 hours prior, then one tab 7 hours prior, and then one tab 1 hour prior to CT. Also ask pt to take benadryl 50 mg po 1 hour prior to CT, Disp: 3 tablet, Rfl: 0      Objective:   Vitals:   03/14/23 1449  BP: 124/66  Pulse: [name] Kitchen)  111  SpO2: 98%  Weight: 168 lb (76.2 kg)  Height: 6' (1.829 m)    Estimated body mass index is 22.78 kg/m as calculated from the following:   Height as of this encounter: 6' (1.829 m).   Weight as of this encounter: 168 lb (76.2 kg).  @WEIGHTCHANGE @  [employer]   03/14/23 1449  Weight: 168 lb (76.2 kg)     Physical Exam   General: No distress. Looks stable but deconditoned O2 at rest: yes Cane present: no Sitting in wheel chair: yes Frail: yes Obese: no Neuro: Alert and Oriented x 3. GCS 15. Speech normal Psych: Pleasant Resp:  Barrel Chest - yes.  Wheeze - mild yes, Crackles - no, No overt respiratory distress CVS: Normal heart sounds. Murmurs - no Ext: Stigmata of Connective Tissue Disease - no HEENT: Normal upper airway. PEERL +. No post nasal drip        Assessment:     No diagnosis found.     Plan:     Patient Instructions  Nodule of lower lobe of right lung - 9mm new Jul 10, 2021. 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  CT Dec 2023 and June 2024 with response and no tumor recurrence  Plan - per DR  Arbutus Ped and Mitzi Hansen - CT scan timing  COPD with acute exacerbation (HCC) COPD, frequent exacerbations (HCC) Stage 4 very severe COPD by GOLD classification (HCC) Chronic respiratory failure with hypoxia (HCC)  - early flare up 06/06/2022 and again 10/08/2022 and again 03/14/2023  - blood tests showing eosinophilia but respective do not want to do Dupixent.   Plan - Take doxycycline 100mg  po twice daily x 5 days; take after meals and avoid sunlight  - hold azithromycin during this time - Take prednisone 40 mg daily x 2 days, then 30mg  daily x 2 days, then 20mg  daily x 2 days, then 10mg  daily baseline to cntiue - Otherwise  continue  daily prednisone 10mg , azithromycin MWF, and roflumilast daily per routine   --Continue oxygen, Spiriva, Symbicort, Daliresp and 3 times weekly azithromycin and daily prednisone otherwise (after flare up Rx)     History Pulmonary embolism -   -normal d-dimer Jan 2023  Plan  - - continue low dose eliquis for now esp with cancer and leg edema    Follow-up -3 months with dR Myia Bergh ; 15 min visit   FOLLOWUP Return in about 3 months (around 06/13/2023) for 15 min visit, with Dr Francisco [name], Face to Face Visit.    SIGNATURE    Dr. Kalman Shan, M.D., F.C.C.P,  Pulmonary and Critical Care Medicine Staff Physician, [facility] Director - Interstitial Lung Disease  Program  Pulmonary Fibrosis [facility] [place], [place], [address]  Pager: [phone], If no answer or between  15:00h - 7:00h: call [phone] Telephone: [phone]  3:24 PM 03/14/2023

## 2023-03-14 NOTE — Addendum Note (Signed)
Addended by: Erick Blinks A on: 03/14/2023 03:35 PM   Modules accepted: Orders

## 2023-03-15 ENCOUNTER — Encounter: Payer: Self-pay | Admitting: Family Medicine

## 2023-03-17 ENCOUNTER — Encounter: Payer: Self-pay | Admitting: Family Medicine

## 2023-03-27 ENCOUNTER — Telehealth: Payer: Self-pay | Admitting: Radiation Therapy

## 2023-03-27 NOTE — Telephone Encounter (Signed)
I called this patient and spoke with his wife about his upcoming brain MRI and visits with Rad Onc. While we were on the phone she asked me if the visit Mr. Seme has scheduled with Dr. Arbutus Ped on 12/16 can be done virtually. If this cannot be done, they are requesting the appointment be moved up to earlier in the day. Either way Ms. Muska has requested a call back for clarification about this visit.   This was sent to Med Onc Pod 3 for their attention and follow-up.  Jalene Mullet R.T.(R)(T) Radiation Special Procedures Navigator

## 2023-03-28 ENCOUNTER — Ambulatory Visit: Payer: Medicare Other | Admitting: Gastroenterology

## 2023-04-04 ENCOUNTER — Telehealth: Payer: Self-pay | Admitting: Internal Medicine

## 2023-04-04 NOTE — Telephone Encounter (Signed)
Rescheduled December's follow-up to a My chart virtual visit per patient's request. Called and spoke with patient's spouse to confirm change, patient will be notified.

## 2023-04-11 ENCOUNTER — Other Ambulatory Visit: Payer: Self-pay | Admitting: Family Medicine

## 2023-04-11 DIAGNOSIS — Z1212 Encounter for screening for malignant neoplasm of rectum: Secondary | ICD-10-CM

## 2023-04-11 DIAGNOSIS — Z1211 Encounter for screening for malignant neoplasm of colon: Secondary | ICD-10-CM

## 2023-04-17 ENCOUNTER — Ambulatory Visit: Payer: Medicare Other | Admitting: Gastroenterology

## 2023-04-17 ENCOUNTER — Encounter: Payer: Self-pay | Admitting: Gastroenterology

## 2023-04-17 VITALS — BP 130/80 | HR 88 | Ht 72.0 in | Wt 167.0 lb

## 2023-04-17 DIAGNOSIS — K5520 Angiodysplasia of colon without hemorrhage: Secondary | ICD-10-CM | POA: Diagnosis not present

## 2023-04-17 DIAGNOSIS — R911 Solitary pulmonary nodule: Secondary | ICD-10-CM | POA: Diagnosis not present

## 2023-04-17 DIAGNOSIS — K862 Cyst of pancreas: Secondary | ICD-10-CM

## 2023-04-17 DIAGNOSIS — J449 Chronic obstructive pulmonary disease, unspecified: Secondary | ICD-10-CM | POA: Diagnosis not present

## 2023-04-17 DIAGNOSIS — K3182 Dieulafoy lesion (hemorrhagic) of stomach and duodenum: Secondary | ICD-10-CM

## 2023-04-17 NOTE — Progress Notes (Signed)
Chief Complaint: Pancreatic cyst on imaging   Referring Provider:     Dr. Shirline Frees     HPI:     Francisco [name] is a 77 y.o. male with a history of COPD (on home O2), PAD (on Eliquis), CHF, prior PE, small cell lung cancer  s/p SBRT to RLL nodule, hyperlipidemia, colon polyps, duodenal AVMs.  He was last seen by Dr.  Meridee Score on 11/15/2020 for ongoing evaluation of IDA.  Subsequently hospital admission in 08/2022 with UGI bleed.  - 08/12/2022: Inpatient EGD for GI bleed: 3 cm HH, adherent clot in gastric fundus with suspected underlying lesion treated with hemostatic clips x 2 and epinephrine, mild antral gastritis, AVM x 2 in second portion of duodenum treated with APC.  Was treated with Protonix 40 mg twice daily x 8 weeks, then reduced to 40 mg daily for ongoing prophylaxis given need for chronic anticoagulation   He follows closely in the Pulmonary Clinic, last seen 03/14/2023 for his severe COPD with frequent exacerbations.  Hospital admission in 02/2023 with syncope and collapse, likely due to orthostatic hypotension.  He is referred back to the GI clinic today for evaluation of pancreatic cyst noted on recent CT as below:  - 07/27/2022: MRI abdomen: Large cystic lesion in the pancreatic tail measuring 2.4 x 1.4 cm without suspicious postcontrast enhancement or nodularity.  Additional cystic lesions in the pancreatic tail, body, head measuring up to 9 mm without suspicious features.  No PD dilation.  Recommend EUS vs follow-up MRCP in 6 months - 08/09/2022: CT A/P: 1.7 x 1.7 cm low-attenuation cystic area in the tail of the pancreas.  No acute inflammation or PD dilation. - 12/24/2022: CT chest: Cystic lesion of the pancreatic tail and cystic lesion of upper pole of right kidney  He reports having itching with MRI contrast.  This occurred with MRI abdomen in January along with recent MRI brain.  No issue tolerating CT with contrast.   - 8/26/924: Hemoglobin A1c 5.3%.   Stable H/H at 10.7/33, normal liver enzymes  No nausea, vomiting, abdominal pain. Does have chrinic back pain with known hx of compression fractures. Has variable bowel habits with sometimes 0 and other days 6 small BM/day, but formed stools. No acholic stools or steatorrhea.        Latest Ref Rng & Units 03/03/2023    4:24 PM 02/22/2023   11:01 AM 02/22/2023    1:54 AM  CBC  WBC 4.0 - 10.5 K/uL 10.4  10.6  7.4   Hemoglobin 13.0 - 17.0 g/dL 40.9  9.6  9.1   Hematocrit 39.0 - 52.0 % 33.3  30.4  29.0   Platelets 150.0 - 400.0 K/uL 234.0  201  173       Latest Ref Rng & Units 03/03/2023    4:24 PM 02/22/2023    1:54 AM 02/21/2023    8:35 AM  CMP  Glucose 70 - 99 mg/dL 93  811  914   BUN 6 - 23 mg/dL 17  11  15    Creatinine 0.40 - 1.50 mg/dL 7.82  9.56  2.13   Sodium 135 - 145 mEq/L 142  140  137   Potassium 3.5 - 5.1 mEq/L 4.9  4.2  4.4   Chloride 96 - 112 mEq/L 94  98  92   CO2 19 - 32 mEq/L 42  31  33   Calcium 8.4 - 10.5 mg/dL  9.6  8.5  8.7   Total Protein 6.0 - 8.3 g/dL 6.5     Total Bilirubin 0.2 - 1.2 mg/dL 0.4     Alkaline Phos 39 - 117 U/L 58     AST 0 - 37 U/L 25     ALT 0 - 53 U/L 19         Past Medical History:  Diagnosis Date   Chronic back pain    "mid and lower" (04/07/2018)   Chronic rhinitis    -Sinus Ct 08/01/2009 >> Bilateral maxillary sinusitis with some mucosal thickeningin the sphenoid and frontal sinuses as well with air fluid levels present -chronic rhinitis flyer Aug 04, 2009   Compressed spine fracture [facility])    COPD (chronic obstructive pulmonary disease) (HCC)    PFT's rec Jul 17, 2009   Dyspnea    Emphysema lung (HCC)    On home oxygen therapy    "3L; 24/7" (04/07/2018)   Onychomycosis    Dr. Melvyn Novas   Orthostatic hypotension    "since 10/2017" (04/07/2018)   PAD (peripheral artery disease) (HCC)    Pneumonia    "twice in 1 year" (04/07/2018)   Pulmonary embolism (HCC) 04/07/2018   Skin cancer    "lips, face, ears, arms" (04/07/2018)   Small  cell lung cancer, right (HCC) 2023   Dr. Mitzi Hansen.  rad   Vertigo    "since ~ 02/2018" (04/07/2018)     Past Surgical History:  Procedure Laterality Date   ABDOMINAL AORTOGRAM W/LOWER EXTREMITY N/A 01/21/2020   Procedure: ABDOMINAL AORTOGRAM W/LOWER EXTREMITY;  Surgeon: Sherren Kerns, MD;  Location: [facility] INVASIVE CV LAB;  Service: Cardiovascular;  Laterality: N/A;   APPENDECTOMY     BIOPSY  02/20/2020   Procedure: BIOPSY;  Surgeon: Meridee Score Netty Starring., MD;  Location: [facility] ENDOSCOPY;  Service: Gastroenterology;;   BIOPSY  08/12/2022   Procedure: BIOPSY;  Surgeon: Shellia Cleverly, DO;  Location: [facility] ENDOSCOPY;  Service: Gastroenterology;;   BRONCHIAL BIOPSY  09/04/2021   Procedure: BRONCHIAL BIOPSIES;  Surgeon: Josephine Igo, DO;  Location: [facility] ENDOSCOPY;  Service: Pulmonary;;   BRONCHIAL BRUSHINGS  09/04/2021   Procedure: BRONCHIAL BRUSHINGS;  Surgeon: Josephine Igo, DO;  Location: [facility] ENDOSCOPY;  Service: Pulmonary;;   BRONCHIAL NEEDLE ASPIRATION BIOPSY  09/04/2021   Procedure: BRONCHIAL NEEDLE ASPIRATION BIOPSIES;  Surgeon: Josephine Igo, DO;  Location: [facility] ENDOSCOPY;  Service: Pulmonary;;   CATARACT EXTRACTION, BILATERAL     ENDARTERECTOMY FEMORAL Right 04/19/2019   Procedure: ENDARTERECTOMY RIGHT COMMON FEMORAL;  Surgeon: Sherren Kerns, MD;  Location: [facility] OR;  Service: Vascular;  Laterality: Right;   ENDARTERECTOMY FEMORAL Left 01/24/2020   Procedure: LEFT FEMORAL ENDARTERECTOMY WITH DACRON PATCH ANGIOPLASTY;  Surgeon: Sherren Kerns, MD;  Location: [facility] OR;  Service: Vascular;  Laterality: Left;   ESOPHAGOGASTRODUODENOSCOPY (EGD) WITH PROPOFOL N/A 02/20/2020   Procedure: ESOPHAGOGASTRODUODENOSCOPY (EGD) WITH PROPOFOL;  Surgeon: Lemar Lofty., MD;  Location: [facility] ENDOSCOPY;  Service: Gastroenterology;  Laterality: N/A;   ESOPHAGOGASTRODUODENOSCOPY (EGD) WITH PROPOFOL N/A 08/12/2022   Procedure: ESOPHAGOGASTRODUODENOSCOPY (EGD) WITH PROPOFOL;  Surgeon: Shellia Cleverly, DO;   Location: [facility] ENDOSCOPY;  Service: Gastroenterology;  Laterality: N/A;   FEMORAL ENDARTERECTOMY Left 01/24/2020   FIDUCIAL MARKER PLACEMENT  09/04/2021   Procedure: FIDUCIAL MARKER PLACEMENT;  Surgeon: Josephine Igo, DO;  Location: [facility] ENDOSCOPY;  Service: Pulmonary;;   HEMOSTASIS CLIP PLACEMENT  02/20/2020   Procedure: HEMOSTASIS CLIP PLACEMENT;  Surgeon: Lemar Lofty., MD;  Location: [facility] ENDOSCOPY;  Service: Gastroenterology;;   HEMOSTASIS CLIP PLACEMENT  08/12/2022   Procedure: HEMOSTASIS CLIP PLACEMENT;  Surgeon: Shellia Cleverly, DO;  Location: [facility] ENDOSCOPY;  Service: Gastroenterology;;   HOT HEMOSTASIS N/A 02/20/2020   Procedure: HOT HEMOSTASIS (ARGON PLASMA COAGULATION/BICAP);  Surgeon: Lemar Lofty., MD;  Location: [facility] ENDOSCOPY;  Service: Gastroenterology;  Laterality: N/A;   HOT HEMOSTASIS N/A 08/12/2022   Procedure: HOT HEMOSTASIS (ARGON PLASMA COAGULATION/BICAP);  Surgeon: Shellia Cleverly, DO;  Location: [facility] ENDOSCOPY;  Service: Gastroenterology;  Laterality: N/A;   INSERTION OF ILIAC STENT Left 01/24/2020   Procedure: INSERTION OF VBX STENT 8X59 AND 8X39 LEFT COMMON ILIAC ARTERY. INSERTION OF INNOVA 7 X 60 INNOVA STENT LEFT EXTERNAL ILIAC ARTERY. ;  Surgeon: Sherren Kerns, MD;  Location: [facility] OR;  Service: Vascular;  Laterality: Left;   LOWER EXTREMITY ANGIOGRAPHY  12/11/2018   LOWER EXTREMITY ANGIOGRAPHY N/A 12/11/2018   Procedure: LOWER EXTREMITY ANGIOGRAPHY;  Surgeon: Sherren Kerns, MD;  Location: [facility] INVASIVE CV LAB;  Service: Cardiovascular;  Laterality: N/A;   PATCH ANGIOPLASTY Right 04/19/2019   Procedure: Patch Angioplasty;  Surgeon: Sherren Kerns, MD;  Location: [facility] OR;  Service: Vascular;  Laterality: Right;   PERIPHERAL VASCULAR INTERVENTION Right 12/11/2018   Procedure: PERIPHERAL VASCULAR INTERVENTION;  Surgeon: Sherren Kerns, MD;  Location: [facility] INVASIVE CV LAB;  Service: Cardiovascular;  Laterality: Right;  Common Iliac    SKIN CANCER EXCISION      "lips, face, ears, arms" (04/07/2018)   SUBMUCOSAL INJECTION  08/12/2022   Procedure: SUBMUCOSAL INJECTION;  Surgeon: Shellia Cleverly, DO;  Location: [facility] ENDOSCOPY;  Service: Gastroenterology;;   TONSILLECTOMY     ULTRASOUND GUIDANCE FOR VASCULAR ACCESS Right 01/24/2020   Procedure: ULTRASOUND GUIDANCE FOR VASCULAR ACCESS;  Surgeon: Sherren Kerns, MD;  Location: [facility] OR;  Service: Vascular;  Laterality: Right;   VIDEO BRONCHOSCOPY WITH RADIAL ENDOBRONCHIAL ULTRASOUND  09/04/2021   Procedure: VIDEO BRONCHOSCOPY WITH RADIAL ENDOBRONCHIAL ULTRASOUND;  Surgeon: Josephine Igo, DO;  Location: [facility] ENDOSCOPY;  Service: Pulmonary;;   Family History  Problem Relation Age of Onset   Heart disease Mother        CABG in her 32s, nonsmoker   Cancer Mother    Stroke Father    Heart disease Father        Died of MI at age 35, smoker   Hepatitis Sister    Coronary artery disease Other        male 1st degree relative <60   Colon cancer Neg Hx    Pancreatic cancer Neg Hx    Esophageal cancer Neg Hx    Inflammatory bowel disease Neg Hx    Liver disease Neg Hx    Rectal cancer Neg Hx    Stomach cancer Neg Hx    Social History   Tobacco Use   Smoking status: Former    Current packs/day: 0.00    Average packs/day: 2.0 packs/day for 52.0 years (104.0 ttl pk-yrs)    Types: Pipe, Cigarettes    Start date: 10/06/1965    Quit date: 10/06/2017    Years since quitting: 5.5    Passive exposure: Never   Smokeless tobacco: Never   Tobacco comments:    04/07/2018 "smoked cigarettes years ago; stopped ~ 20 yr ago; stopped smoking pipe in 09/2017"  Vaping Use   Vaping status: Never Used  Substance Use Topics   Alcohol use: Yes    Alcohol/week: 11.0 standard drinks of alcohol    Types: 11 Cans  of beer per week    Comment: 35 per week prior to fall january 2024   Drug use: No   Current Outpatient Medications  Medication Sig Dispense Refill   acetaminophen (TYLENOL) 500 MG tablet Take 500 mg by mouth  every 4 (four) hours as needed for moderate pain, fever, headache or mild pain.     albuterol (PROVENTIL) (2.5 MG/3ML) 0.083% nebulizer solution Take 3 mLs (2.5 mg total) by nebulization every 6 (six) hours as needed for wheezing or shortness of breath. 75 mL 12   azithromycin (ZITHROMAX) 250 MG tablet Take 250 mg by mouth See admin instructions. Continuous M,W,F     Calcium Carbonate-Vitamin D (CALCIUM 600+D PO) Take 2 tablets by mouth daily.     docusate sodium (COLACE) 100 MG capsule Take 2 capsules (200 mg total) by mouth 2 (two) times daily. (Patient taking differently: Take 200 mg by mouth 2 (two) times daily. Patient uses PRN) 10 capsule 0   doxycycline (VIBRA-TABS) 100 MG tablet Take 1 tablet (100 mg total) by mouth 2 (two) times daily. 10 tablet 0   ELIQUIS 2.5 MG TABS tablet TAKE 1 TABLET BY MOUTH TWICE  DAILY (Patient taking differently: Take 2.5 mg by mouth 2 (two) times daily.) 180 tablet 3   Ferrous Sulfate (IRON) 325 (65 Fe) MG TABS Take 325 mg by mouth every evening.     fluticasone (FLONASE) 50 MCG/ACT nasal spray USE 2 SPRAYS IN BOTH  NOSTRILS DAILY (Patient taking differently: Place 2 sprays into both nostrils daily.) 48 g 3   folic acid (FOLVITE) 1 MG tablet Take 1 tablet (1 mg total) by mouth daily.     furosemide (LASIX) 20 MG tablet TAKE 1 TABLET BY MOUTH DAILY AS  NEEDED FOR FLUID OR EDEMA (Patient taking differently: Take 20 mg by mouth daily.) 90 tablet 3   Multiple Vitamin (MULTIVITAMIN WITH MINERALS) TABS tablet Take 1 tablet by mouth daily.     mupirocin cream (BACTROBAN) 2 % Apply 1 Application topically 2 (two) times daily. 30 g 0   oxyCODONE (OXY IR/ROXICODONE) 5 MG immediate release tablet Take 5 mg by mouth every 4 (four) hours as needed for severe pain.     OXYGEN Inhale 4-5 L into the lungs continuous.      pantoprazole (PROTONIX) 20 MG tablet TAKE 1 TABLET BY MOUTH DAILY 90 tablet 3   Polyethyl Glycol-Propyl Glycol (SYSTANE) 0.4-0.3 % SOLN Place 1 drop into both  eyes every other day.     predniSONE (DELTASONE) 10 MG tablet TAKE 1 TABLET BY MOUTH DAILY  WITH BREAKFAST 90 tablet 3   roflumilast (DALIRESP) 500 MCG TABS tablet TAKE 1 TABLET BY MOUTH DAILY 90 tablet 3   rosuvastatin (CRESTOR) 20 MG tablet Take 1 tablet (20 mg total) by mouth daily. (Patient taking differently: Take 20 mg by mouth every evening.) 90 tablet 3   SYMBICORT 160-4.5 MCG/ACT inhaler USE 2 INHALATIONS BY MOUTH TWICE DAILY 30.6 g 3   Tiotropium Bromide Monohydrate (SPIRIVA RESPIMAT) 2.5 MCG/ACT AERS USE 2 INHALATIONS BY MOUTH ONCE  DAILY 12 g 3   traMADol (ULTRAM) 50 MG tablet Take 50 mg by mouth every 12 (twelve) hours as needed for severe pain.     No current facility-administered medications for this visit.   Allergies  Allergen Reactions   Tape Other (See Comments)    Skin tears easily.   Augmentin [Amoxicillin-Pot Clavulanate] Other (See Comments)    Thrush Sore throat Hoarse voice   Cipro [Ciprofloxacin Hcl]  Nausea And Vomiting    Weakness Fatigue    Gadavist [Gadobutrol] Other (See Comments)    Unknown reaction   Gadolinium Rash    Itchy back rash   Levaquin [Levofloxacin] Other (See Comments)    Hallucinations    Lipitor [Atorvastatin] Rash     Review of Systems: All systems reviewed and negative except where noted in HPI.     Physical Exam:    Wt Readings from Last 3 Encounters:  04/17/23 167 lb (75.8 kg)  03/14/23 168 lb (76.2 kg)  03/03/23 168 lb (76.2 kg)    BP 130/80   Pulse 88   Ht 6' (1.829 m)   Wt 167 lb (75.8 kg)   BMI 22.65 kg/m  Constitutional:  Pleasant, in no acute distress.  Sitting in motorized wheelchair Psychiatric: Normal mood and affect. Behavior is normal. EENT: Pupils normal.  Conjunctivae are normal. No scleral icterus.  Nasal cannula in place Neurological: Alert and oriented to person place and time. Skin: Skin is warm and dry. No rashes noted.   ASSESSMENT AND PLAN;   1) Pancreatic cyst Large cystic lesion in the  tail of the pancreas on MRI abdomen 07/2022 without high-risk or suspicious findings.  A subsequent CT in 08/2022 with similar 1.7 cm cystic area in the tail of the pancreas, again without PD dilation.  Most recent CT chest again captures cystic lesion in the tail of the pancreas.  We discussed these radiographic findings at length today.  Discussed the DDx for pancreatic cysts.  Discussed options for further evaluation, to include continued scans (MRI vs CT) or EUS.  I had a candid conversation with the patient and his wife regarding his overall health and whether or not to even pursue continued screening of these cystic lesions.  In the event that there was pancreatic cancer, very unlikely that he would be a surgical candidate given his significant pulmonary disease, and he very clearly voices no interest in pursuing chemotherapy for any cancers.  With that said, he also says that he would not want to pursue EUS due to the risk/benefit profile of EUS/sedation related to his underlying comorbidities. After in-depth conversation, he would like to discuss further with his wife whether or not to pursue additional screening and get back to me.  - Patient to contact me with whether or not to pursue MR Pancreas or CT Pancreas. If selecting any ongoing screening, he would be more inclined to move forward with CT given his adverse reaction to gadolinium previously (flushing and itching).   2) Duodenal AVM No recent overt bleeding.  H/H largely stable  3) Severe COPD on home O2 4) Small cell lung cancer - Continue follow-up in the Pulmonary and Oncology clinics  I spent 45 minutes of time, including in depth chart review, independent review of results as outlined above, communicating results with the patient directly, face-to-face time with the patient, coordinating care, ordering studies and medications as appropriate, and documentation.    Shellia Cleverly, DO, FACG  04/17/2023, 10:02 AM   Shelva Majestic, MD

## 2023-04-17 NOTE — Patient Instructions (Signed)
_______________________________________________________  If your blood pressure at your visit was 140/90 or greater, please contact your primary care physician to follow up on this. _______________________________________________________  If you are age 75 or older, your body mass index should be between 23-30. Your Body mass index is 22.65 kg/m. If this is out of the aforementioned range listed, please consider follow up with your Primary Care Provider. ________________________________________________________  The [place] GI providers would like to encourage you to use [facility] to communicate with providers for non-urgent requests or questions.  Due to long hold times on the telephone, sending your provider a message by [facility] may be a faster and more efficient way to get a response.  Please allow 48 business hours for a response.  Please remember that this is for non-urgent requests.  _______________________________________________________  Bonita Quin will follow up in our office on an as needed basis.  Thank you for entrusting me with your care and choosing [facility].  Dr Barron Alvine

## 2023-04-26 ENCOUNTER — Other Ambulatory Visit: Payer: Self-pay | Admitting: Family Medicine

## 2023-04-28 ENCOUNTER — Encounter: Payer: Self-pay | Admitting: Internal Medicine

## 2023-04-30 NOTE — Telephone Encounter (Signed)
Patient will need new 02 qualifying walk and new 02 order placed as if he never had 02 before home concentrator

## 2023-04-30 NOTE — Telephone Encounter (Signed)
See second message from 04/28/2023.   We will requalify patient for O2 at his appt on 11/18. Message has been sent to the patient/patient's wife to make them aware.  Nothing further needed.

## 2023-04-30 NOTE — Telephone Encounter (Signed)
Patient needs to speak with a nurse about a form from [employer]. In order for him to get approved for insurance he needs to have a 6 minute walk or blood work done. Patient's wife states that he husband cannot walk fro 6 mins so the blood work would be best and they would like it to be done [facility] during the morning. Please call wife back at [phone]

## 2023-05-03 ENCOUNTER — Encounter: Payer: Self-pay | Admitting: Internal Medicine

## 2023-05-05 NOTE — Telephone Encounter (Signed)
Francisco [name] please advise °

## 2023-05-06 MED ORDER — CEFDINIR 300 MG PO CAPS: 300.0000 mg | ORAL_CAPSULE | Freq: Two times a day (BID) | ORAL | 0 refills | Status: DC

## 2023-05-06 MED ORDER — PREDNISONE 20 MG PO TABS: 20.0000 mg | ORAL_TABLET | Freq: Every day | ORAL | 0 refills | Status: DC

## 2023-05-06 NOTE — Telephone Encounter (Signed)
MR patient with Severe COPD/O2 RF   COPD exacerbation -Omnicef 300mg  Twice daily for 1 week (PCN intolerance -thrush). Would recommend yogurt daily while taking .  Increase Prednisone 20mg  daily for 5 days then back to 10mg  daily  Hold azithromycin while taking .  If not better needs ov , worse ER Make sure he has follow up with [name]   Please contact office for sooner follow up if symptoms do not improve or worsen or seek emergency care   Rx were sent to pharm

## 2023-05-06 NOTE — Telephone Encounter (Signed)
It is fine. There is a new nebukzer caled ORTHUVAYRE that helops breathing and prevents flare up. A nebulizer. HE should consider t his

## 2023-05-21 DIAGNOSIS — M7989 Other specified soft tissue disorders: Secondary | ICD-10-CM

## 2023-05-26 ENCOUNTER — Ambulatory Visit (INDEPENDENT_AMBULATORY_CARE_PROVIDER_SITE_OTHER): Payer: Medicare Other | Admitting: Internal Medicine

## 2023-05-26 ENCOUNTER — Encounter: Payer: Self-pay | Admitting: Internal Medicine

## 2023-05-26 VITALS — BP 134/74 | HR 116 | Ht 72.0 in | Wt 164.0 lb

## 2023-05-26 DIAGNOSIS — R54 Age-related physical debility: Secondary | ICD-10-CM

## 2023-05-26 DIAGNOSIS — Z636 Dependent relative needing care at home: Secondary | ICD-10-CM

## 2023-05-26 DIAGNOSIS — Z7189 Other specified counseling: Secondary | ICD-10-CM | POA: Diagnosis not present

## 2023-05-26 DIAGNOSIS — Z66 Do not resuscitate: Secondary | ICD-10-CM

## 2023-05-26 DIAGNOSIS — J449 Chronic obstructive pulmonary disease, unspecified: Secondary | ICD-10-CM | POA: Diagnosis not present

## 2023-05-26 DIAGNOSIS — J9611 Chronic respiratory failure with hypoxia: Secondary | ICD-10-CM

## 2023-05-26 NOTE — Patient Instructions (Addendum)
Nodule of lower lobe of right lung - 9mm new Jul 10, 2021. 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 - contnued gradual worsneing of status on 05/26/2023 - pulse ox 86% room air at rest -> corrected with 4L [place] to 94%   Plan - resepect No Dupixent - START OTHUVAYRE NEbulizer to help copd -   continue  daily prednisone 10mg , azithromycin MWF, and roflumilast daily per routine   --Continue oxygen 4L [place] at rest and , Spiriva, Symbicort as before  = you do requalify - give sputum 05/26/2023 for gram stain and culture   History Pulmonary embolism -   -normal d-dimer Jan 2023  Plan  - - continue low dose eliquis for now esp with cancer and leg edema  Goals of care  DNR Care giver burden   - agree with No CPR, No intubation  Plan  - take MOST form and review it; gives more clarity for scope of care  - I Think ok for antibiotics and BiPAP and fluids but no cpr - if further worsening of status, then will entertain Hospice   - call us when needed    Follow-up -3 months with dR Opel Lejeune ; 15 min visit

## 2023-05-26 NOTE — Progress Notes (Signed)
ROV 12/30/16 -- patient has a history of tobacco use, COPD currently maintained on chronic prednisone, Symbicort, Spiriva. Most recent blurry function testing was March 2011 with an FEV1 of 1.2 L (34% Pred), severe obstruction. He up titrates his prednisone on his own based on symptoms. No abx since last time. He has increased pred temporrily about 4 -5 x since last time. No increases for over a month. He is smoking a pipe, not cigarettes. He is using HCTZ more frequently these days. He is on flonase and singulair. He does note that he has some slow progression of his DOE. He coughs a few times a day, clears clear sputum.   rov 07/17/17 --this is a follow-up visit for patient with active tobacco use, severe COPD and chronic prednisone use.  He often titrates his prednisone on his own depending on how he is feeling. He complains of a new pain in his back below his left shoulder blade, has been present for the last 10 days. sometimes pleuritic, can be stabbing, worse w cough. His pred use: on 10mg  for the last 19 days. No hx VTE. He has LE edema, venous stasis changes, increased since his diuretics decreased.   ROV 09/23/17 --77 year old man with a history of tobacco use (still smokes pipe), severe COPD and severe obstruction on spirometry.  At his last visit he had some pleuritic left back pain and I performed a CT PA that I have reviewed from 07/17/17.  There was no pulmonary embolism but extensive emphysema present, some bilateral apical scar more so on the right, stable.  He uses prednisone 10 mg daily and uptitrate depending on his day-to-day symptoms.  He is otherwise managed on Symbicort and Spiriva.  Desaturated on arrival today after ambulating to the exam room. He reports more dyspnea, has increased pred intermittently.    OV 12/05/2017  Chief Complaint  Patient presents with   Follow-up    Switching from RB to MR.   Pt is on 3L with exertion and 2L at rest. Pt states his breathing had become  worse but since he has been on O2 since 3/21, it has really helped with his breathing.  Pt was in hospital 4/28-5/3. Other than SOB, pt does have c/o cough, rattling in chest. Denies any CP/chest tightness.   History from patient, his wife and review of the old chart  Francisco Grime IIIs a transfer of care from [name] to myself [name].  His son is my patient and therefore he is done this transfer of care.  According to the patient he is to be seen by [name] for COPD.  Patient is FEV1 34% based on March 2011 PFTs according to chart review.  At baseline he is maintained on Spiriva, Symbicort, Singulair, chronic daily prednisone 10 mg/day for at least 3 years, and 3 times a week of azithromycin for 2 years.  Earlier this year in March 2019 he went on daytime oxygen and following a recent hospitalization April-May 2019.  For syncope that was believed due to nocturnal hypoxemia he was started on night oxygen.  Since starting oxygen his edema has improved and his overall quality of life is improved and he stopped having any orthostasis or presyncopal episodes.  He is grateful for the fact that he is on oxygen.  His current COPD CAT score and severity of symptoms is rated below and is 26.  Show significant amount of symptom burden.  His main goal is to improve his  quality of life.  He had his wife have many questions centering around quality of life, medication therapy.  Of note he has had some thoracic vertebral fractures that are new.  This happened after the fall in late April 2019.  Discovered at admission last month.  He feels is due to prednisone.  He is wondering about portable oxygen.   OV 01/29/2018  Chief Complaint  Patient presents with   Follow-up    Pt states he has been doing okay since last visit. States breathing is about the same, has an occ cough but states the O2 has helped out a lot with breathing and cough. Denies any complaints of CP.   Francisco [name] , 77 y.o. , with  dob [birth year] and male ,Not Hispanic or Latino from [address] [place] [address] - presents to lung clinic for advanced COPD follow-up. Since his last visit his symptoms course of improvement. Score is 26 and severe symptoms of document below. He is on Spiriva, Symbicort, continues oxygen, daily prednisone and azithromycin 3 times a week. He has stopped his singulair without any problems. Overall he says he is better. He is willing to give TRELEGY inhaler trial. Last visit to check blood gas and he does not have hypercapnia so he does not qualify for BiPAP        OV 04/03/2018  Subjective:  Patient ID: Francisco [name], male , DOB: [birth year] , age 77 y.o. , MRN: [id number] , ADDRESS: [address] [place] [address]   04/03/2018 -   Chief Complaint  Patient presents with   Acute Visit    Pt's O2 sats have been dropping into the 70s with little exertion.  Pt took O2 off just to shave 9/26 and after he finished shaving put the O2 back on, walked from the bathroom down to the living room on O2 and sats were at 77% on 3L 9/26. Pt also has c/o cough with white to yellow mucus and chest tightness with the SOB.     HPI Francisco [name] 77 y.o. -acute visit for this patient.  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  He is chronically prednisone dependent.  He also is on schedule azithromycin.  He called in yesterday feeling unwell therefore we asked him to come in today.  He tells me that approximately a week ago he started having increased cough and congestion.  Yesterday primary care physician following a routine visit thought he was in COPD exacerbation and started him on doxycycline.  He also personally bumped up his baseline prednisone.  He does me that yesterday while he was shaving on room air he felt more short of breath than usual.  Also when he walked he desaturated more than usual therefore he decided to call in and come in today.  There  is no fever or chills or hemoptysis or colored sputum.  No leg edema.  No orthopnea.  He does not feel like he needs to be in the emergency department or get admitted.   OV 05/11/2018  Subjective:  Patient ID: Francisco [name], male , DOB: 1945/09/12 , age 8 y.o. , MRN: [id number] , ADDRESS: [address] [place] [address]   05/11/2018 -   Chief Complaint  Patient presents with   Follow-up    pt states breathing has wrosen since last OV. increased sob, occ chest tightness, prod cough with yellow mucus &  chest congestion. currently taking trelegy samples. recent admission 04/07/18 for PE.     HPI Francisco [name] 77 y.o. -advanced COPD with chronic hypoxemic respiratory failure [not a BiPAP candidate because of lack of hypercapnia] presents with his wife for follow-up.  After the last visit he continued to have symptoms so on April 07, 2018 he got an outpatient CT scan and he had pulmonary embolism.  He got admitted to the hospital and then discharged and since then has been on Xarelto.  He continues his baseline 2-3 L of nasal cannula oxygen but he says since then he is more symptomatic.  Is also corresponds with him starting Trelegy inhaler and also for the last few to several weeks is having increased cough and congestion and yellow phlegm.  His COPD CAT score is deteriorated to 35 and above.  He is very frustrated by his symptoms.  Review of his medication shows that he takes azithromycin and chronic prednisone and Trelegy and oxygen for his COPD.  He is on Xarelto for his blood clots.  He is also on Fosamax for prednisone dependent osteoporosis management.  His smoking is in remission.  Wife says he is always sedentary.  Apparently they tried to do some physical therapy a month ago but he desaturated.  He does not seem to keen on physical therapy right now     OV 06/10/2018  Subjective:  Patient ID: Francisco [name], male , DOB: 20-Jun-1946 , age 29 y.o. , MRN: [id number] ,  ADDRESS: [address] [place] [address]   06/10/2018 -   Chief Complaint  Patient presents with   Follow-up    follows for COPD. patient has increased SOB with exertion     HPI Francisco [name] 77 y.o. -returns for gold stage IV COPD follow-up with chronic hypoxemic respiratory failure.  I saw him approximately a month ago at which time recommended Trelegy.  However the Trelegy is not working well for him.  He feels Spiriva and Symbicort is of benefit for him.  Also recommended he start himself on Daliresp.  Given the GI side effect profile I communicated with his primary care physician about stopping Fosamax.  His primary care physician has advised Reclast infusion for him and is considering this but has not yet switched over.  This because the infusions will have to happen at [facility] [address] [facility].  However he never started his Daliresp because he read the side effect profile and was worried about back pain.  He wanted me to go over the side effect profile of Daliresp all over again.  I printed up-to-date and went over all the side effects.  I did this with him and his wife.  There is a 3% incidence of backache.  The dominant feature is GI issues of weight loss and diarrhea and nausea and abdominal pain.  The second dominant feature is some anxiety issues.  After reading all this he has decided to start Daliresp.  He is aware of the limitations as well.  He is aware that this is purely preventive in reducing COPD exacerbation frequency.  He understands that the burden on his health from frequent COPD exacerbations is high.  Currently COPD CAT score is back at baseline        OV 07/22/2018  Subjective:  Patient ID: Francisco [name], male , DOB: 1945-11-21 , age 48 y.o. , MRN: [id number] , ADDRESS: [address] [place] [address]   07/22/2018 -  Chief Complaint  Patient presents with   Follow-up    Pt states it has been rough since last visit. States he has been coughing with  white to yellow phlegm, wheezing, increased SOB which has been going on x2-3 weeks now. Pt denies any real complaints of chest tightness/chest pain.     HPI Francisco [name] 77 y.o. -presents for follow-up of his advanced COPD with chronic hypoxemic respiratory failure.  At last visit we introduce Daliresp as is an effort to prevent COPD flareups.  After some trepidation he started taking it.  He is currently on full dose 5 mcg daily and is tolerating it well.  However he does not think it is reduced the frequency of flareups but again it is too soon to tell.  Approximately over a week ago after some sick contact exposure he had symptoms and signs of COPD exacerbation.  He was given I suspect Levaquin but this caused some confusion.  After that he was changed to doxycycline.  Today's his last of 7 days with doxycycline he is better.  COPD CAT score is improved to 26 but he still is coughing quite a bit and has mucus and feels that he is still not fully back to baseline.  He had a chest x-ray July 13, 2018 that reports this is clear.  He continues on oxygen Spiriva and Symbicort.  There are no other new issues.  No chest pain edema hemoptysis or weight loss.    OV 03/05/2019  Subjective:  Patient ID: Francisco [name], male , DOB: 1945/10/13 , age 51 y.o. , MRN: [id number] , ADDRESS: [address] [place] [address]   03/05/2019 -   Chief Complaint  Patient presents with   COPD with acute exacerbation    Feels it is a little worse since June. Has cough with congestion, was white in color. But this morning it has a yellow and green color to it.     HPI Francisco [name] 77 y.o. -returns for his advanced COPD follow-up.  He says that since his last visit he has been doing overall fair and stable.  Uses 3 L of oxygen, Daliresp, Spiriva, Symbicort.  He has been avoiding risk activities for COVID-19.  He says in the last few weeks he has had increased congestion he does not think this is  COVID-19.  Today he has green-yellow sputum.  He is asking about taking a flu shot.    OV 05/07/2019  Subjective:  Patient ID: Francisco [name], male , DOB: [birth year] , age 69 y.o. , MRN: [id number] , ADDRESS: [address] [place] [address]   05/07/2019 -   Chief Complaint  Patient presents with   Follow-up    Pt states his breathing is at his baseline. Pt c/o prod cough with clear mucus - baseline for pt. Pt denies CP/tightness and f/c/s.    Advance COPD with chronic hypoxemic respiratory failure.  Baseline functional status ECOG 3-4  HPI Francisco [name] 77 y.o. -presents for COPD follow-up.  He continues to be stable using oxygen.  His current COPD CAT score is 30 which is his baseline.  He recently had a vascular surgery.  He is on oxygen, prednisone, Daliresp, Spiriva and Symbicort.  He is gone back to taking azithromycin for prophylaxis m again COPD exacerbation.        OV 11/11/2019  Subjective:  Patient ID: Francisco [name], male , DOB: [birth year] ,  age 71 y.o. , MRN: [id number] , ADDRESS: [address] [place] [address]   11/11/2019 -   Chief Complaint  Patient presents with   Follow-up    Pt states his breathing is progressively becoming worse and states he feels like he may be in a flare with the COPD. Pt states he is coughing up yellow-green phlegm and also has complaints of wheezing.   Advance COPD patient with recurrent COPD exacerbations.  HPI Francisco [name] 77 y.o. -presents for follow-up.  He says last month he saw Elisha Headland nurse practitioner.  He says he was given a different antibiotic that really helped.  Review of the records indicate one time it was Augmentin another time it was [place].  He believes the second 1 really helped.  He feels in another exacerbation.  He is having green sputum.  He says with the flutter valve and exacerbation a lot of sputum comes out but between exacerbations is very minimal.  He is frustrated by his  recurrent exacerbations.  He did not tolerate Trelegy in the past.  He is on Spiriva, Symbicort, oxygen 3 L, flutter valve, Flonase, Roflumilast and 3 times a week of azithromycin.  He is looking to see if he can improve his quality of life.  His COPD CAT score is 34.  His last CT scan of the chest was in 2019.     CAT Score 11/11/2019 03/05/2019 04/03/2018  Total CAT Score 32 30 30     OV 11/26/2019 - telephone visit. 2PHI identified. Risks, benefit, limitation explained  Subjective:  Patient ID: Francisco [name], male , DOB: [birth year] , age 36 y.o. , MRN: [id number] , ADDRESS: [address] [place] [address] Advanced COPD with recurrent exacerbation now colonized by pseudomonas  11/26/2019 -    HPI Francisco [name] 77 y.o. -last seen by myself Nov 11, 2019.  At that time we decided to do a work-up for recurrent exacerbations.  His immunoglobulin levels are normal.  His sputum grew Pseudomonas that is pansensitive.  He CT scan of the chest shows right upper lobe worsening fibrotic pattern.  There is no mass per se.  I personally visualized the CT chest.  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  Today in the visit he tells me after 3 days he had nausea and then he had joint pain on the third day and quit taking it.  He still continues to be symptomatic.     ROS - per HPI  CT Chest Wo Contrast  Result Date: 11/25/2019 CLINICAL DATA:  COPD exacerbation. Severe shortness of breath. EXAM: CT CHEST WITHOUT CONTRAST TECHNIQUE: Multidetector CT imaging of the chest was performed following the standard protocol without IV contrast. COMPARISON:  04/07/2018. FINDINGS: Cardiovascular: The heart size is normal. There is no pericardial effusion. Aortic atherosclerosis. Three vessel coronary artery atherosclerotic calcifications identified Mediastinum/Nodes: No enlarged mediastinal or axillary  lymph nodes. Thyroid gland, trachea, and esophagus demonstrate no significant findings. Lungs/Pleura: No pleural effusion identified. Advanced changes of centrilobular and paraseptal emphysema with mild bullous changes. Progressive confluent fibrotic with extensive architectural distortion and volume loss no acute airspace consolidation, atelectasis or pneumothorax. Scattered, bilateral areas of pleuroparenchymal scarring identified changes within the apical portions of the right upper lobe are again noted. Upper Abdomen: Bilateral low-attenuation adrenal nodules appears similar to previous exam and are compatible with benign  adenomas. No acute abnormality identified within the imaged portions of the upper abdomen. Musculoskeletal: Mild scoliosis and degenerative disc disease noted within the thoracic spine. Stable chronic compression deformities at T6, T7 and T8. No new compression fractures. IMPRESSION: 1. No acute cardiopulmonary abnormalities. 2. Progressive confluent fibrotic changes within the right upper lobe with extensive architectural distortion and volume loss. Likely the sequelae of prior inflammation/infection. 3. Aortic atherosclerosis, in addition to 3 vessel coronary artery disease. Please note that although the presence of coronary artery calcium documents the presence of coronary artery disease, the severity of this disease and any potential stenosis cannot be assessed on this non-gated CT examination. Assessment for potential risk factor modification, dietary therapy or pharmacologic therapy may be warranted, if clinically indicated. 4. Bilateral adrenal adenomas. 5. Stable chronic compression deformities at T6, T7 and T8. 6. Diffuse bronchial wall thickening with emphysema, as above; imaging findings suggestive of underlying COPD. Aortic Atherosclerosis (ICD10-I70.0) and Emphysema (ICD10-J43.9). Electronically Signed   By: Signa Kell M.D.   On: 11/25/2019 12:21     OV  05/03/2020  Subjective:  Patient ID: Francisco [name], male , DOB: 07-Dec-1945 , age 69 y.o. , MRN: [id number] , ADDRESS: [address] [place] [address] PCP Shelva Majestic, MD Patient Care Team: Shelva Majestic, MD as PCP - General (Family Medicine) Kalman Shan, MD as Consulting Physician (Pulmonary Disease) Arminda Resides, MD as Consulting Physician (Dermatology) Dr. Mickie Hillier, DDS (Dentistry)  This Provider for this visit: Treatment Team:  Attending Provider: Kalman Shan, MD    05/03/2020 -   Chief Complaint  Patient presents with   Follow-up    doing best he has in a year and a half.     ICD-10-CM   1. Stage 4 very severe COPD by GOLD classification (HCC)  J44.9   2. Chronic respiratory failure with hypoxia (HCC)  J96.11   3. COPD, frequent exacerbations (HCC)  J44.1   4. Abnormal findings on diagnostic imaging of lung  R91.8   5. Pulmonary embolism, unspecified chronicity, unspecified pulmonary embolism type, unspecified whether acute cor pulmonale present (HCC)  I26.99   6. Vaccine counseling  Z71.85      HPI Francisco [name] 77 y.o. -presents with his wife for the above issues.  He said his Covid booster has had his flu shot.  He has follow-up coming end of November with primary care physician.  He wants me to send a note to the primary care physician.  He is without any exacerbations.  He continues prednisone, azithromycin, Roflumilast, Spiriva and Symbicort.  He does not want to do triple inhaler therapies.  He is on anticoagulation.  He says after the GI bleeding issues resolved he is better.  He had a CT scan of the chest that is documented below.  His infiltrates are improving but there is mucus/debris in the main airway he is denying any aspiration.  He does have hiatal hernia.  Apparently this was discovered during endoscopy.  CAT Score 05/03/2020 11/11/2019 03/05/2019 04/03/2018  Total CAT Score 16 32 30 30     IMPRESSION: 1. Trace right  pleural effusion and associated atelectasis or consolidation, both of which are improved compared to prior examination. Findings are generally consistent with resolving sequelae of prior infection or aspiration. 2. Diffuse bilateral bronchial wall thickening consistent with nonspecific infectious or inflammatory bronchitis. 3. Frothy debris in the bilateral mainstem bronchi, concerning for aspiration. 4. Severe centrilobular emphysema. Emphysema (ICD10-J43.9). 5. Nonspecific fibrotic  scarring of the right greater than left lung apices, likely sequelae of prior infection or inflammation. 6. Coronary artery disease.  Aortic Atherosclerosis (ICD10-I70.0).     Electronically Signed   By: Lauralyn Primes M.D.   On: 04/26/2020 13:42   ROS - per HPI  OV 08/31/2020  Subjective:  Patient ID: Francisco [name], male , DOB: 04-Mar-1946 , age 8 y.o. , MRN: [id number] , ADDRESS: [address] [place] [address] PCP Shelva Majestic, MD Patient Care Team: Shelva Majestic, MD as PCP - General (Family Medicine) Kalman Shan, MD as Consulting Physician (Pulmonary Disease) Arminda Resides, MD as Consulting Physician (Dermatology) Dr. Mickie Hillier, DDS (Dentistry)  This Provider for this visit: Treatment Team:  Attending Provider: Kalman Shan, MD    08/31/2020 -   Chief Complaint  Patient presents with   Follow-up    More congestion at the moment, more SOB   Advanced end-stage COPD on oxygen and ECOG 3-4 History of pulmonary embolism on anticoagulation  HPI Francisco [name] 77 y.o. -presents for follow-up.  Presents with his wife he sitting on wheelchair.  He says for the last few weeks he is having a flareup with increased urine production that is green.  He wants antibiotics.  He says in December 2021 I switched his Omnicef to ciprofloxacin based on Pseudomonas and sensitivity but he actually felt the Geyser worked better.  However he said that the duration again was too  short and he actually wants Omnicef again for 14 days at this time.  He also thinks a prednisone taper/burst would help him.  Otherwise he continues on Spiriva Symbicort Daliresp 3 times weekly azithromycin and daily prednisone 10 mg.  Is also on oxygen.  He is on anticoagulation for his pulmonary embolism.  He fell down and broke his back but did not have any bleeding.  He is going to have an MRI.  Noted that he is not on any Mucinex and is going to start this after my advice.  We discussed about switching his Breo and Symbicort to a triple inhaler combination either Trelegy or BREZTRI but he says both of not working he does not want to do it.   OV 04/26/2021  Subjective:  Patient ID: Francisco [name], male , DOB: [birth year] , age 53 y.o. , MRN: [id number] , ADDRESS: [address] [place] [address] PCP Shelva Majestic, MD Patient Care Team: Shelva Majestic, MD as PCP - General (Family Medicine) Kalman Shan, MD as Consulting Physician (Pulmonary Disease) Arminda Resides, MD as Consulting Physician (Dermatology) Dr. Mickie Hillier, DDS (Dentistry)  This Provider for this visit: Treatment Team:  Attending Provider: Kalman Shan, MD    04/26/2021 -   Chief Complaint  Patient presents with   Follow-up    Pt states he is about the same since last visit. Has problems with coughing and will get up occ white to yellow phlegm and also has had some wheezing.    Advanced end-stage COPD on oxygen and ECOG 3-4 - last CT Oct 2021 History of pulmonary embolism on anticoagulation - June 2021 Iron def anemia - follows Dr Arbutus Ped  HPI Francisco [name] 77 y.o. -returns for follow-up with his wife.  His ECOG continues to be 3-4.  He is wheelchair and minimal activities.  He is on oxygen, Spiriva, Symbicort, azithromycin for preventing COPD exacerbations, Daliresp, daily prednisone.  Is not using 3% saline nebulizer.  Instead he says flutter valve  works well for him.  Last seen in  February 2022 and since then no exacerbations.  The longest time he has had without exacerbation he is happy about it.  He has some waxing and waning sputum production.  Few days ago it was yellow but now clear.  He is going to monitor this.  He is up-to-date with his COVID vaccines.  He is no longer following with Dr. Arbutus Ped for his iron deficiency anemia.  He is on anticoagulation for his PE in June 2021.    CT Chest data  No results found.    PFT  No flowsheet data found.  OV 07/26/2021  Subjective:  Patient ID: Francisco [name], male , DOB: 1945-12-24 , age 17 y.o. , MRN: [id number] , ADDRESS: [address] [place] [address] PCP Shelva Majestic, MD Patient Care Team: Shelva Majestic, MD as PCP - General (Family Medicine) Kalman Shan, MD as Consulting Physician (Pulmonary Disease) Arminda Resides, MD as Consulting Physician (Dermatology) Dr. Mickie Hillier, DDS (Dentistry)  This Provider for this visit: Treatment Team:  Attending Provider: Kalman Shan, MD    07/26/2021 -   Chief Complaint  Patient presents with   Follow-up    Pt is here to discuss results of recent CT.    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 -follows for his COPD.  Presents with his wife.  He continues on his oxygen at 4 L.  He feels he is beginning to have a COPD exacerbation.  He did have a CT scan of the chest that was routine.  It shows a 9 mm right lower lobe superior segment nodule.  I looked at it it looks solid and lobulated.  I think there is a fair probability greater than 65% this is malignancy.  He is asymptomatic from it.  He just wants preemptive antibiotics and prednisone burst for his incipient COPD exacerbation.  In terms of his pulmonary  embolism from 2 and half years ago he continues his Eliquis.  The Eliquis at low-dose for prophylaxis.  His D-dimer in the past has been normal.  But we decided to continue the low-dose Eliquis.  We will check his D-dimer again.    CAT Score 04/26/2021 08/31/2020 05/03/2020 11/11/2019  Total CAT Score 31 23 16  32        CT chest 07/10/21  Narrative & Impression  CLINICAL DATA:  COPD exacerbation. Respiratory failure with hypoxia.   EXAM: CT CHEST WITHOUT CONTRAST   TECHNIQUE: Multidetector CT imaging of the chest was performed following the standard protocol without IV contrast.   COMPARISON:  04/25/2020   FINDINGS: Cardiovascular: The heart size is within normal limits. No pericardial effusion. Aortic atherosclerosis and coronary artery calcifications.   Mediastinum/Nodes: No enlarged mediastinal or axillary lymph nodes. Thyroid gland, trachea, and esophagus demonstrate no significant findings.   Lungs/Pleura: Severe changes of centrilobular and paraseptal emphysema. Diffuse bronchial wall thickening. No pleural effusion or acute airspace consolidation. No interstitial edema. Scarring and masslike architectural distortion within the right apex appears unchanged from the previous exam. Within the superior segment of right lower lobe there is a lung nodule measuring 1.1 x 0.6 cm (mean diameter 9 mm), image 82/3. New compared with the previous exam.  Upper Abdomen: No acute findings within the imaged portions of the upper abdomen. Aortic atherosclerosis.   Musculoskeletal: Curvature of the thoracic spine is convex towards the right. Compression fractures are again noted involving T6, T7 and T8. Unchanged from previous exam.   IMPRESSION: 1. No acute cardiopulmonary abnormalities. 2. There is a new pulmonary nodule within the superior segment of right lower lobe with a mean diameter of 9 mm. Consider a non-contrast Chest CT at 3 months, a PET/CT, or tissue  sampling. These guidelines do not apply to immunocompromised patients and patients with cancer. Follow up in patients with significant comorbidities as clinically warranted. For lung cancer screening, adhere to Lung-RADS guidelines. Reference: Radiology. 2017; 284(1):228-43. 3. Stable scarring and masslike architectural distortion within the right apex. 4. Diffuse bronchial wall thickening with emphysema, as above; imaging findings suggestive of underlying COPD. 5. Stable thoracic compression fractures. 6. Aortic Atherosclerosis (ICD10-I70.0) and Emphysema (ICD10-J43.9).     Electronically Signed   By: Signa Kell M.D.   On: 07/10/2021 13:33       09/11/2021 Pt. Presents for follow up. He had Flexible video fiberoptic bronchoscopy with robotic assistance and biopsies on 09/04/2021. He states he has done well after his biopsy. No significant bleeding. Just a scan amount the day after. He did have a lot of rib pain , that has subsequently improved. I gave the patient the biopsy results. We discussed this in full to include treatment options and all questions were asked and answered. [name] Kitchen   He says he is currently in the middle of a COPD flare. He has thick yellow green secretions .He also endorses a bad cough.  He does have a flutter valve. He does endorse some wheezing.He resumed his Eliquis on 09/06/2021.We will treat him for his COPD flare today.He is compliant with his Daliresp, Spiriva and Symbicort.    I have referred to radiation oncology, as he is oxygen dependent and in a wheelchair, SBRT is best option for this single nodule. . Both he and his wife had  questions that were answered. I have asked them to call the office if they need anything at all.   Test Results: Cytology 09/04/2021 MICROSCOPIC DIAGNOSIS:  A. 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  Immunohistochemistry shows  patchy positivity with cytokeratin 5/6 and weak positivity with CD56.  The malignant cells are negative with TTF-1, chromogranin,  synaptophysin, p40 and p63.  Ki-67 shows a high proliferation rate.   PET scan 08/08/2021 Solitary hypermetabolic pulmonary nodule in the RIGHT lower lobe with intense metabolic activity is most consistent PRIMARY BRONCHOGENIC CARCINOMA. 2. No mediastinal adenopathy, supraclavicular adenopathy or distant metastatic disease. No skeletal metastasis.     OV 12/05/2021  Subjective:  Patient ID: Francisco [name], male , DOB: 1945/09/11 , age 84 y.o. , MRN: [id number] , ADDRESS: [address] [place] [id number] PCP Shelva Majestic, MD Patient Care Team: Shelva Majestic, MD as PCP - General (Family Medicine) Kalman Shan, MD as Consulting Physician (Pulmonary Disease) Arminda Resides, MD as Consulting Physician (Dermatology) Dr. Mickie Hillier, DDS (Dentistry)  This Provider for this visit: Treatment Team:  Attending Provider: Kalman Shan, MD    12/05/2021 -   Chief Complaint  Patient presents  with   Follow-up    Pt states since his last radiation treatment, his breathing has become a little worse. States his cough is about the same.    HPI Francisco [name] 77 y.o. -return to see me.  I presents with his wife.  I am seeing him for the first time after the diagnose of cancer.  He said radiation 3 times.  He says after the radiation he is a little bit more short of breath.  He is now needing 4 L nasal cannula.  When he exerts he can desaturate into the 80s and sometimes into the 70s.  He is able to recover with resting.  His amount of cough and sputum is around the same.  It is between clear and green and yellow.  He is not in a COPD exacerbation currently but according to his wife it appears that he might get  one by next week.  She says whenever the antibiotic cephalosporin is on and prednisone he is doing great but otherwise he will have a flareup.  He is already on azithromycin 3 times a week and also Roflumilast for prevention of COPD flareup.  Despite this he gets recurrent COPD flareup.  Reviewed his eosinophilic profile.  Prior to prednisone some of the eosinophils were as high as 300 cells per cubic millimeter.  We did discuss about the fact that Dupixent might be effective but is eosinophilic count can be obliterated by the prednisone.  He is willing to try this if he has eosinophilia or if you are able to get insurance justification for COPD/asthma overlap.  He does behave like COPD and asthma overlap.  Currently his medications include Symbicort, Spiriva, Daliresp, azithromycin, Flonase and oxygen  He and his wife are requesting a prescription for COPD exacerbation be sent   He has upcoming appointment Dr. Arbutus Ped.  He does not think he will be a good candidate for chemotherapy CT Chest data  No results found.      OV 03/07/2022  Subjective:  Patient ID: Francisco [name], male , DOB: Jun 06, 1946 , age 57 y.o. , MRN: [id number] , ADDRESS: [address] [place] [facility] [id number] PCP Shelva Majestic, MD Patient Care Team: Shelva Majestic, MD as PCP - General (Family Medicine) Kalman Shan, MD as Consulting Physician (Pulmonary Disease) Arminda Resides, MD as Consulting Physician (Dermatology) Dr. Mickie Hillier, DDS (Dentistry)  This Provider for this visit: Treatment Team:  Attending Provider: Kalman Shan, MD  03/07/2022 -   Chief Complaint  Patient presents with   Follow-up    Pt states he has been doing about the same since last visit.     HPI DALIAN SMID [name] 77 y.o. -returns for follow-up of his COPD.  Since last visit no flareups.  He is doing stable.  Overall the same.  Continues all his medications.  Ordered blood gas last time but for some reason it was not  done.  We did blood eosinophils and it was 200 cells per cubic millimeter suppressed by prednisone.  He did not qualify fo dupixwnr.  I recommended we do this again and he is willing.  He has upcoming labs with Dr. Tana Conch March 14, 2022.  I have sent a secure chat to Dr. Durene Cal to get this done.  We talked about getting the full vaccines of RSV, flu and COVID.  He plans to do that.  No new issues.  His wife is here with him.   OV 10/08/2022  Subjective:  Patient ID: Francisco [name], male , DOB: 19-Mar-1946 , age 51 y.o. , MRN: [id number] , ADDRESS: [address] [place] [address] PCP Shelva Majestic, MD Patient Care Team: Shelva Majestic, MD as PCP - General (Family Medicine) Kalman Shan, MD as Consulting Physician (Pulmonary Disease) Arminda Resides, MD as Consulting Physician (Dermatology) Dr. Mickie Hillier, DDS (Dentistry)  This Provider for this visit: Treatment Team:  Attending Provider: Kalman Shan, MD    10/08/2022 -   Chief Complaint  Patient presents with   Follow-up    Pt is here for follow up for copd. Pt is on Symbicort and Sprivia daily and Albuterol as needed. Pt states that these are working well for him with his copd. Pt is on 4L of oxygen pulsed and is workinf well.      HPI Francisco [name] 77 y.o. -returns for follow-up.  Since his last visit he has had admission to the hospital for intestinal issues and bleeding.  During this time this was in February 2024 his blood eosinophils did go up and he meets criteria for Dupixent.  He then went to a nursing home and apparently had pneumonia.  I do not have those films with me..  In the interim he is also had CT scan of the chest in December 2023 and it shows no evidence of tumor recurrence.  I personally visualized the films.  He feels he is got a COPD exacerbation coming along right now for the last few days with brown sputum he wants another course of antibiotics and prednisone.  We did discuss  Dupixent and he wants to do it.  His wife is an independent historian today and she affirms the same.  Last visit I did recommend Hill ROM event 2000 system but he does not want to do it.  This because it is very inconvenient for him.  Otherwise he continues his regular medications including oxygen prednisone 10 mg azithromycin and low-dose Eliquis.    OV 03/14/2023  Subjective:  Patient ID: Francisco [name], male , DOB: [birth year] , age 16 y.o. , MRN: [id number] , ADDRESS: [address] [place] [facility] [phone] PCP Shelva Majestic, MD Patient Care Team: Shelva Majestic, MD as PCP - General (Family Medicine) Kalman Shan, MD as Consulting Physician (Pulmonary Disease) Arminda Resides, MD as Consulting Physician (Dermatology) Dr. Mickie Hillier, DDS (Dentistry)  This Provider for this visit: Treatment Team:  Attending Provider: Kalman Shan, MD    03/14/2023 -   Chief Complaint  Patient presents with   Follow-up    Pt states he is coughing up a lot of brown phlegm x 3 weeks     HPI Francisco [name] 77 y.o. -returns for follow-up.  Presents with his wife.  After the last visit he got approved for Dupixent but he is rejected it because of the side effect concern.  I went over this again with him today but he is steadfast that he does not want to do it.  At the same time is reporting incipient exacerbation for the last 3 weeks with increasing cough and sputum production sputum is brown in color.  He feels he will benefit from antibiotic and prednisone.  Otherwise no changes.  Last CT scan of the chest June 2024 without any lung cancer recurrence.   Past medical history - Admitted for 2 days mid August 2024 with syncope.  Has a ulcer in his left lower extremity  OV 05/26/2023  Subjective:  Patient ID: Francisco [name], male , DOB: [birth year] , age 10 y.o. , MRN: [id number] , ADDRESS: [address] [place] [phone] PCP Shelva Majestic, MD Patient Care  Team: Shelva Majestic, MD as PCP - General (Family Medicine) Kalman Shan, MD as Consulting Physician (Pulmonary Disease) Arminda Resides, MD as Consulting Physician (Dermatology) Dr. Mickie Hillier, DDS (Dentistry)  This Provider for this visit: Treatment Team:  Attending Provider: Kalman Shan, MD    05/26/2023 -   Chief Complaint  Patient presents with   Follow-up    Pt here to requalify for 02, pt recently has copd flare up. Using inhalers and neb, prednisone and still on azithromycin. Decrease in energy      HPI Francisco ROLLE [name] 77 y.o. -returns for follow-up.  Presents with his wife.  Wife is an independent historian as is he.  They are here because the insurance company is asking him to requalify for oxygen despite him having Gold stage IV COPD and on chronic 4 L oxygen.  I turned the oxygen off and his pulse ox within a few minutes dropped to 86%.  He required all of 4 L nasal cannula to correct.  The denying any active flareup but the wife is reporting progressive decline in health status.  She says normally she would push him with the wheelchair to the toilet and then he will be able to self transfer and give a shower.  He is barely able to self transfer.  He is struggling to even do simple activities of daily living such as going to the toilet without significant help.  She does not feel she needs intervention of hospice right now.  She does not feel she needs extra domestic support.  However they did ask questions about life expectancy.  Explained to them the general nature of difficulty with stage IV COPD but did indicate to them that he is in the worse prognostic category because of frequent flareups frailty chronic steroids and also Gold stage IV.  He is renal function is normal though.  He does have a living will and he is a DNR.  Prognosis very well can be a order of months sometimes can be years.  We talked about MOST form I gave him a copy of that.  I also talked  about the concept of hospice [detailed below].  They not ready for this at this point in time.  We moved conversation about potential treatments.  I again discussed Dupixent.  There are 2 studies showing improvement in lung function and reduce flareups but he still does not want to do it.  We talked about the new nebulizer Othyvayre -and he is interested.  Blackbox warnings described and side effects described.  Discussed medicare hospice benefit  - a medicare paid benefit  - for people with terminal qualifying  illness such as IPF, COPD, cancer with statistical prognosis < 6 months for which there is no cure - utilization of hospice shows people live longer paradoxically than those without hospice due to improved attention  - explained hospice locations - home, residential etc.,   - explained respite care options for caregivers  - explained that she could still get treatment for non-hospice diagnosis and still come to office to see me for hospice related diagnosis that i provide support for  - explained that hospice provides nursing, MD, chaplain, volunteer and medications and supplies paid through medicare   PFT  No data to display            LAB RESULTS last 96 hours No results found.  LAB RESULTS last 90 days Recent Results (from the past 2160 hour(s))  Comprehensive metabolic panel     Status: Abnormal   Collection Time: 03/03/23  4:24 PM  Result Value Ref Range   Sodium 142 135 - 145 mEq/L   Potassium 4.9 3.5 - 5.1 mEq/L   Chloride 94 (L) 96 - 112 mEq/L   CO2 42 (H) 19 - 32 mEq/L   Glucose, Bld 93 70 - 99 mg/dL   BUN 17 6 - 23 mg/dL   Creatinine, Ser 1.91 0.40 - 1.50 mg/dL   Total Bilirubin 0.4 0.2 - 1.2 mg/dL   Alkaline Phosphatase 58 39 - 117 U/L   AST 25 0 - 37 U/L   ALT 19 0 - 53 U/L   Total Protein 6.5 6.0 - 8.3 g/dL   Albumin 3.7 3.5 - 5.2 g/dL   GFR 47.82 >95.62 mL/min    Comment: Calculated using the CKD-EPI Creatinine Equation (2021)   Calcium 9.6 8.4  - 10.5 mg/dL  CBC with Differential/Platelet     Status: Abnormal   Collection Time: 03/03/23  4:24 PM  Result Value Ref Range   WBC 10.4 4.0 - 10.5 K/uL   RBC 3.49 (L) 4.22 - 5.81 Mil/uL   Hemoglobin 10.7 (L) 13.0 - 17.0 g/dL   HCT 13.0 (L) 86.5 - 78.4 %   MCV 95.4 78.0 - 100.0 fl   MCHC 32.1 30.0 - 36.0 g/dL   RDW 69.6 29.5 - 28.4 %   Platelets 234.0 150.0 - 400.0 K/uL   Neutrophils Relative % 85.2 Repeated and verified X2. (H) 43.0 - 77.0 %   Lymphocytes Relative 6.7 (L) 12.0 - 46.0 %   Monocytes Relative 7.0 3.0 - 12.0 %   Eosinophils Relative 0.7 0.0 - 5.0 %   Basophils Relative 0.4 0.0 - 3.0 %   Neutro Abs 8.8 (H) 1.4 - 7.7 K/uL   Lymphs Abs 0.7 0.7 - 4.0 K/uL   Monocytes Absolute 0.7 0.1 - 1.0 K/uL   Eosinophils Absolute 0.1 0.0 - 0.7 K/uL   Basophils Absolute 0.0 0.0 - 0.1 K/uL  IBC + Ferritin     Status: Abnormal   Collection Time: 03/03/23  4:24 PM  Result Value Ref Range   Iron 30 (L) 42 - 165 ug/dL   Transferrin 132.4 401.0 - 360.0 mg/dL   Saturation Ratios 6.9 (L) 20.0 - 50.0 %   Ferritin 27.6 22.0 - 322.0 ng/mL   TIBC 436.8 250.0 - 450.0 mcg/dL  HgB U7O     Status: None   Collection Time: 03/03/23  4:24 PM  Result Value Ref Range   Hgb A1c MFr Bld 5.3 4.6 - 6.5 %    Comment: Glycemic Control Guidelines for People with Diabetes:Non Diabetic:  <6%Goal of Therapy: <7%Additional Action Suggested:  >8%          has a past medical history of Chronic back pain, Chronic rhinitis, Compressed spine fracture (HCC), COPD (chronic obstructive pulmonary disease) (HCC), Dyspnea, Emphysema lung (HCC), On home oxygen therapy, Onychomycosis, Orthostatic hypotension, PAD (peripheral artery disease) (HCC), Pneumonia, Pulmonary embolism (HCC) (04/07/2018), Skin cancer, Small cell lung cancer, right (HCC) (2023), and Vertigo.   reports that he quit smoking about 5 years ago. His smoking use included pipe and cigarettes. He started smoking about 57 years ago. He has a 104 pack-year  smoking history. He  has never been exposed to tobacco smoke. He has never used smokeless tobacco.  Past Surgical History:  Procedure Laterality Date   ABDOMINAL AORTOGRAM W/LOWER EXTREMITY N/A 01/21/2020   Procedure: ABDOMINAL AORTOGRAM W/LOWER EXTREMITY;  Surgeon: Sherren Kerns, MD;  Location: [facility];  Service: Cardiovascular;  Laterality: N/A;   APPENDECTOMY     BIOPSY  02/20/2020   Procedure: BIOPSY;  Surgeon: Meridee Score Netty Starring., MD;  Location: [facility] ENDOSCOPY;  Service: Gastroenterology;;   BIOPSY  08/12/2022   Procedure: BIOPSY;  Surgeon: Shellia Cleverly, DO;  Location: [facility] ENDOSCOPY;  Service: Gastroenterology;;   BRONCHIAL BIOPSY  09/04/2021   Procedure: BRONCHIAL BIOPSIES;  Surgeon: Josephine Igo, DO;  Location: [facility] ENDOSCOPY;  Service: Pulmonary;;   BRONCHIAL BRUSHINGS  09/04/2021   Procedure: BRONCHIAL BRUSHINGS;  Surgeon: Josephine Igo, DO;  Location: [facility] ENDOSCOPY;  Service: Pulmonary;;   BRONCHIAL NEEDLE ASPIRATION BIOPSY  09/04/2021   Procedure: BRONCHIAL NEEDLE ASPIRATION BIOPSIES;  Surgeon: Josephine Igo, DO;  Location: [facility] ENDOSCOPY;  Service: Pulmonary;;   CATARACT EXTRACTION, BILATERAL     ENDARTERECTOMY FEMORAL Right 04/19/2019   Procedure: ENDARTERECTOMY RIGHT COMMON FEMORAL;  Surgeon: Sherren Kerns, MD;  Location: [facility] OR;  Service: Vascular;  Laterality: Right;   ENDARTERECTOMY FEMORAL Left 01/24/2020   Procedure: LEFT FEMORAL ENDARTERECTOMY WITH DACRON PATCH ANGIOPLASTY;  Surgeon: Sherren Kerns, MD;  Location: [facility] OR;  Service: Vascular;  Laterality: Left;   ESOPHAGOGASTRODUODENOSCOPY (EGD) WITH PROPOFOL N/A 02/20/2020   Procedure: ESOPHAGOGASTRODUODENOSCOPY (EGD) WITH PROPOFOL;  Surgeon: Lemar Lofty., MD;  Location[facility] ENDOSCOPY;  Service: Gastroenterology;  Laterality: N/A;   ESOPHAGOGASTRODUODENOSCOPY (EGD) WITH PROPOFOL N/A 08/12/2022   Procedure: ESOPHAGOGASTRODUODENOSCOPY (EGD) WITH PROPOFOL;  Surgeon: Shellia Cleverly, DO;   Location: [facility] ENDOSCOPY;  Service: Gastroenterology;  Laterality: N/A;   FEMORAL ENDARTERECTOMY Left 01/24/2020   FIDUCIAL MARKER PLACEMENT  09/04/2021   Procedure: FIDUCIAL MARKER PLACEMENT;  Surgeon: Josephine Igo, DO;  Location: [facility] ENDOSCOPY;  Service: Pulmonary;;   HEMOSTASIS CLIP PLACEMENT  02/20/2020   Procedure: HEMOSTASIS CLIP PLACEMENT;  Surgeon: Lemar Lofty., MD;  Location: [facility] ENDOSCOPY;  Service: Gastroenterology;;   HEMOSTASIS CLIP PLACEMENT  08/12/2022   Procedure: HEMOSTASIS CLIP PLACEMENT;  Surgeon: Shellia Cleverly, DO;  Location: [facility]OS[facility];  Service: Gastroenterology;;   HOT HEMOSTASIS N/A 02/20/2020   Procedure: HOT HEMOSTASIS (ARGON PLASMA COAGULATION/BICAP);  Surgeon: Lemar Lofty., MD;  Location: [facility] ENDOSCOPY;  Service: Gastroenterology;  Laterality: N/A;   HOT HEMOSTASIS N/A 08/12/2022   Procedure: HOT HEMOSTASIS (ARGON PLASMA COAGULATION/BICAP);  Surgeon: Shellia Cleverly, DO;  Location: [facility] ENDOSCOPY;  Service: Gastroenterology;  Laterality: N/A;   INSERTION OF ILIAC STENT Left 01/24/2020   Procedure: INSERTION OF VBX STENT 8X59 AND 8X39 LEFT COMMON ILIAC ARTERY. INSERTION OF INNOVA 7 X 60 INNOVA STENT LEFT EXTERNAL ILIAC ARTERY. ;  Surgeon: Sherren Kerns, MD;  Location[facility] OR;  Service: Vascular;  Laterality: Left;   LOWER EXTREMITY ANGIOGRAPHY  12/11/2018   LOWER EXTREMITY ANGIOGRAPHY N/A 12/11/2018   Procedure: LOWER EXTREMITY ANGIOGRAPHY;  Surgeon: Sherren Kerns, MD;  Location: [facility];  Service: Cardiovascular;  Laterality: N/A;   PATCH ANGIOPLASTY Right 04/19/2019   Procedure: Patch Angioplasty;  Surgeon: Sherren Kerns, MD;  Location: [facility] OR;  Service: Vascular;  Laterality: Right;   PERIPHERAL VASCULAR INTERVENTION Right 12/11/2018   Procedure: PERIPHERAL VASCULAR INTERVENTION;  Surgeon: Sherren Kerns, MD;  Location: [facility];  Service: Cardiovascular;  Laterality:  Right;  Common Iliac    SKIN CANCER EXCISION      "lips, face, ears, arms" (04/07/2018)   SUBMUCOSAL INJECTION  08/12/2022   Procedure: SUBMUCOSAL INJECTION;  Surgeon: Shellia Cleverly, DO;  Location: [facility] ENDOSCOPY;  Service: Gastroenterology;;   TONSILLECTOMY     ULTRASOUND GUIDANCE FOR VASCULAR ACCESS Right 01/24/2020   Procedure: ULTRASOUND GUIDANCE FOR VASCULAR ACCESS;  Surgeon: Sherren Kerns, MD;  Location: [facility] OR;  Service: Vascular;  Laterality: Right;   VIDEO BRONCHOSCOPY WITH RADIAL ENDOBRONCHIAL ULTRASOUND  09/04/2021   Procedure: VIDEO BRONCHOSCOPY WITH RADIAL ENDOBRONCHIAL ULTRASOUND;  Surgeon: Josephine Igo, DO;  Location: [facility] ENDOSCOPY;  Service: Pulmonary;;    Allergies  Allergen Reactions   Tape Other (See Comments)    Skin tears easily.   Augmentin [Amoxicillin-Pot Clavulanate] Other (See Comments)    Thrush Sore throat Hoarse voice   Cipro [Ciprofloxacin Hcl] Nausea And Vomiting    Weakness Fatigue    Gadavist [Gadobutrol] Other (See Comments)    Unknown reaction   Gadolinium Rash    Itchy back rash   Levaquin [Levofloxacin] Other (See Comments)    Hallucinations    Lipitor [Atorvastatin] Rash    Immunization History  Administered Date(s) Administered   Fluad Quad(high Dose 65+) 03/16/2019, 03/10/2020   Fluad Trivalent(High Dose 65+) 03/14/2023   Influenza Split 04/24/2012   Influenza Whole 05/08/2009, 04/08/2011   Influenza, High Dose Seasonal PF 05/01/2017, 03/31/2018, 04/17/2021, 04/03/2022   Influenza,inj,Quad PF,6+ Mos 04/27/2013, 04/15/2014, 04/24/2015   Influenza,inj,quad, With Preservative 05/01/2018   Influenza-Unspecified 05/13/2016   PFIZER Comirnaty(Gray Top)Covid-19 Tri-Sucrose Vaccine 11/24/2020, 04/03/2022, 03/17/2023   PFIZER(Purple Top)SARS-COV-2 Vaccination 07/30/2019, 08/20/2019, 04/07/2020   Pfizer Covid-19 Vaccine Bivalent Booster 25yrs & up 03/16/2021   Pneumococcal Conjugate-13 02/25/2014   Pneumococcal Polysaccharide-23 08/15/2011, 12/19/2016   Pneumococcal-Unspecified  03/08/2017   Respiratory Syncytial Virus Vaccine,Recomb Aduvanted(Arexvy) 06/06/2022   Td 02/21/2010   Tdap 11/05/2017   Zoster, Live 01/29/2012    Family History  Problem Relation Age of Onset   Heart disease Mother        CABG in her 27s, nonsmoker   Cancer Mother    Stroke Father    Heart disease Father        Died of MI at age 66, smoker   Hepatitis Sister    Coronary artery disease Other        male 1st degree relative <60   Colon cancer Neg Hx    Pancreatic cancer Neg Hx    Esophageal cancer Neg Hx    Inflammatory bowel disease Neg Hx    Liver disease Neg Hx    Rectal cancer Neg Hx    Stomach cancer Neg Hx      Current Outpatient Medications:    acetaminophen (TYLENOL) 500 MG tablet, Take 500 mg by mouth every 4 (four) hours as needed for moderate pain, fever, headache or mild pain., Disp: , Rfl:    albuterol (PROVENTIL) (2.5 MG/3ML) 0.083% nebulizer solution, Take 3 mLs (2.5 mg total) by nebulization every 6 (six) hours as needed for wheezing or shortness of breath., Disp: 75 mL, Rfl: 12   azithromycin (ZITHROMAX) 250 MG tablet, Take 250 mg by mouth See admin instructions. Continuous M,W,F, Disp: , Rfl:    Calcium Carbonate-Vitamin D (CALCIUM 600+D PO), Take 2 tablets by mouth daily., Disp: , Rfl:    cefdinir (OMNICEF) 300 MG capsule, Take 1 capsule (300 mg total) by mouth 2 (two) times daily., Disp: 14 capsule, Rfl: 0   docusate  sodium (COLACE) 100 MG capsule, Take 2 capsules (200 mg total) by mouth 2 (two) times daily. (Patient taking differently: Take 200 mg by mouth 2 (two) times daily. Patient uses PRN), Disp: 10 capsule, Rfl: 0   ELIQUIS 2.5 MG TABS tablet, TAKE 1 TABLET BY MOUTH TWICE  DAILY, Disp: 180 tablet, Rfl: 3   Ferrous Sulfate (IRON) 325 (65 Fe) MG TABS, Take 325 mg by mouth every evening., Disp: , Rfl:    fluticasone (FLONASE) 50 MCG/ACT nasal spray, USE 2 SPRAYS IN BOTH  NOSTRILS DAILY (Patient taking differently: Place 2 sprays into both nostrils  daily.), Disp: 48 g, Rfl: 3   folic acid (FOLVITE) 1 MG tablet, Take 1 tablet (1 mg total) by mouth daily., Disp: , Rfl:    furosemide (LASIX) 20 MG tablet, TAKE 1 TABLET BY MOUTH DAILY AS  NEEDED FOR FLUID OR EDEMA (Patient taking differently: Take 20 mg by mouth daily.), Disp: 90 tablet, Rfl: 3   Multiple Vitamin (MULTIVITAMIN WITH MINERALS) TABS tablet, Take 1 tablet by mouth daily., Disp: , Rfl:    mupirocin cream (BACTROBAN) 2 %, Apply 1 Application topically 2 (two) times daily., Disp: 30 g, Rfl: 0   oxyCODONE (OXY IR/ROXICODONE) 5 MG immediate release tablet, Take 5 mg by mouth every 4 (four) hours as needed for severe pain., Disp: , Rfl:    OXYGEN, Inhale 4-5 L into the lungs continuous. , Disp: , Rfl:    pantoprazole (PROTONIX) 20 MG tablet, TAKE 1 TABLET BY MOUTH DAILY, Disp: 90 tablet, Rfl: 3   Polyethyl Glycol-Propyl Glycol (SYSTANE) 0.4-0.3 % SOLN, Place 1 drop into both eyes every other day., Disp: , Rfl:    predniSONE (DELTASONE) 10 MG tablet, TAKE 1 TABLET BY MOUTH DAILY  WITH BREAKFAST, Disp: 90 tablet, Rfl: 3   roflumilast (DALIRESP) 500 MCG TABS tablet, TAKE 1 TABLET BY MOUTH DAILY, Disp: 90 tablet, Rfl: 3   rosuvastatin (CRESTOR) 20 MG tablet, Take 1 tablet (20 mg total) by mouth daily. (Patient taking differently: Take 20 mg by mouth every evening.), Disp: 90 tablet, Rfl: 3   SYMBICORT 160-4.5 MCG/ACT inhaler, USE 2 INHALATIONS BY MOUTH TWICE DAILY, Disp: 30.6 g, Rfl: 3   Tiotropium Bromide Monohydrate (SPIRIVA RESPIMAT) 2.5 MCG/ACT AERS, USE 2 INHALATIONS BY MOUTH ONCE  DAILY, Disp: 12 g, Rfl: 3   traMADol (ULTRAM) 50 MG tablet, Take 50 mg by mouth every 12 (twelve) hours as needed for severe pain., Disp: , Rfl:    predniSONE (DELTASONE) 20 MG tablet, Take 1 tablet (20 mg total) by mouth daily with breakfast. (Patient not taking: Reported on 05/26/2023), Disp: 5 tablet, Rfl: 0      Objective:   Vitals:   05/26/23 1549  BP: 134/74  Pulse: (!) 116  SpO2: 95%  Weight: 164  lb (74.4 kg)  Height: 6' (1.829 m)    Estimated body mass index is 22.24 kg/m as calculated from the following:   Height as of this encounter: 6' (1.829 m).   Weight as of this encounter: 164 lb (74.4 kg).  @WEIGHTCHANGE @  Filed Weights   05/26/23 1549  Weight: 164 lb (74.4 kg)     Physical Exam   General: No distress. cushingoid O2 at rest: YES 4L N, 86% ROOM AIR REST Cane present: no Sitting in wheel chair: YES Frail: YES Obese: n Neuro: Alert and Oriented x 3. GCS 15. Speech normal Psych: Pleasant Resp:  Barrel Chest - YES.  Wheeze - no, Crackles - no, No overt  respiratory distress CVS: Normal heart sounds. Murmurs - no Ext: Stigmata of Connective Tissue Disease - NO HEENT: Normal upper airway. PEERL +. No post nasal drip SKIN DRY FLAKY  Chronic edema +        Assessment:       ICD-10-CM   1. Chronic respiratory failure with hypoxia (HCC)  J96.11     2. 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 Frailty  R54     4. Goals of care, counseling/discussion  Z71.89     5. DNR (do not resuscitate)  Z66     6. Caregiver burden  Z63.6          Plan:     Patient Instructions  Nodule of lower lobe of right lung - 9mm new Jul 10, 2021. 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 - contnued gradual worsneing of status on 05/26/2023 - pulse ox 86% room air at rest -> corrected with 4L [place] to 94%   Plan - resepect No Dupixent - START OTHUVAYRE NEbulizer to help copd -   continue  daily  prednisone 10mg , azithromycin MWF, and roflumilast daily per routine   --Continue oxygen 4L [place] at rest and , Spiriva, Symbicort as before  = you do requalify - give sputum 05/26/2023 for gram stain and culture   History Pulmonary embolism -   -normal d-dimer Jan 2023  Plan  - - continue low dose eliquis for now esp with cancer and leg edema  Goals of care  DNR Care giver burden   - agree with No CPR, No intubation  Plan  - take MOST form and review it; gives more clarity for scope of care  - I Think ok for antibiotics and BiPAP and fluids but no cpr - if further worsening of status, then will entertain Hospice   - call us when needed    Follow-up -3 months with dR Daeton Kluth ; 15 min visit   FOLLOWUP Return in about 3 months (around 08/26/2023) for 15 min visit, Chronic Respiratory Failure, with Dr Marchelle Gearing, Face to Face Visit.    SIGNATURE    Dr. Kalman Shan, M.D., F.C.C.P,  Pulmonary and Critical Care Medicine Staff Physician, [facility] Director - Interstitial Lung Disease  Program  Pulmonary Fibrosis [facility] [place], [place], [address]  Pager: [phone], If no answer or between  15:00h - 7:00h: call [phone] Telephone: [phone]  4:27 PM 05/26/2023   Moderate Complexity MDM OFFICE  2021 E/M guidelines, first released in 2021, with minor revisions added in 2023 and 2024 Must meet the requirements for 2 out of 3 dimensions to qualify.    Number and complexity of problems addressed Amount and/or complexity of data reviewed Risk of complications and/or morbidity  One or more chronic illness with mild exacerbation, OR progression, OR  side effects of treatment  Two or more stable chronic illnesses  One undiagnosed new problem  with uncertain prognosis  One acute illness with systemic symptoms   One Acute complicated injury Must meet the requirements for 1 of 3 of the  categories)  Category 1: Tests and documents, historian  Any combination of 3 of the following:  Assessment requiring an independent historian  Review of prior external note(s) from each unique source  Review of results of each unique test  Ordering of each unique test    Category 2: Interpretation of tests   Independent interpretation of a test performed by another physician/other qualified health care professional (not separately reported)  Category 3: Discuss management/tests  Discussion of management or test interpretation with external physician/other qualified health care professional/appropriate source (not separately reported) Moderate risk of morbidity from additional diagnostic testing or treatment Examples only:  Prescription drug management  Decision regarding minor surgery with identfied patient or procedure risk factors  Decision regarding elective major surgery without identified patient or procedure risk factors  Diagnosis or treatment significantly limited by social determinants of health             HIGh Complexity  OFFICE   2021 E/M guidelines, first released in 2021, with minor revisions added in 2023. Must meet the requirements for 2 out of 3 dimensions to qualify.    Number and complexity of problems addressed Amount and/or complexity of data reviewed Risk of complications and/or morbidity  Severe exacerbation of chronic illness  Acute or chronic illnesses that may pose a threat to life or bodily function, e.g., multiple trauma, acute MI, pulmonary embolus, severe respiratory distress, progressive rheumatoid arthritis, psychiatric illness with potential threat to self or others, peritonitis, acute renal failure, abrupt change in neurological status - severe decline in copd Must meet the requirements for 2 of 3 of the categories)  Category 1: Tests and documents, historian  Any combination of 3 of the following:  Assessment requiring an  independent historian  Review of prior external note(s) from each unique source  Review of results of each unique test  Ordering of each unique test    Category 2: Interpretation of tests    Independent interpretation of a test performed by another physician/other qualified health care professional (not separately reported)  Category 3: Discuss management/tests  Discussion of management or test interpretation with external physician/other qualified health care professional/appropriate source (not separately reported)  HIGH risk of morbidity from additional diagnostic testing or treatment Examples only:  Drug therapy requiring intensive monitoring for toxicity  Decision for elective major surgery with identified pateint or procedure risk factors  Decision regarding hospitalization or escalation of level of care  Decision for DNR or to de-escalate care   Parenteral controlled  substances          =

## 2023-05-29 ENCOUNTER — Telehealth: Payer: Self-pay | Admitting: Pharmacist

## 2023-05-29 NOTE — Telephone Encounter (Signed)
Received [employer] enrollment form from patient. Prescriber form placed in Dr. Jane Canary mailbox for signature  Placed patient form with insurance card copy in awaiting response folder in pharmacy office

## 2023-06-03 ENCOUNTER — Other Ambulatory Visit: Payer: Medicare Other

## 2023-06-03 ENCOUNTER — Other Ambulatory Visit: Payer: Self-pay | Admitting: *Deleted

## 2023-06-03 DIAGNOSIS — J449 Chronic obstructive pulmonary disease, unspecified: Secondary | ICD-10-CM

## 2023-06-03 NOTE — Addendum Note (Signed)
Addended by: Karen Kays C on: 06/03/2023 01:20 PM   Modules accepted: Orders

## 2023-06-06 LAB — RESPIRATORY CULTURE OR RESPIRATORY AND SPUTUM CULTURE
MICRO NUMBER:: 15781993
SPECIMEN QUALITY:: ADEQUATE

## 2023-06-12 NOTE — Telephone Encounter (Signed)
Received signed provider form. [employer]re referral faxed to [employer] with clinicals and insurance card copy  Phone#: [phone] Fax#: [phone]

## 2023-06-17 NOTE — Telephone Encounter (Signed)
Received fax from [facility] [employer] regarding [employer]. Patient is successfully enrolled in [place] [employer]. Rx will be triaged to [facility]  Patient ID: [id number] Phone#: [phone] Fax#: [phone]  Chesley Mires, PharmD, MPH, BCPS, CPP Clinical Pharmacist (Rheumatology and Pulmonology)

## 2023-06-18 ENCOUNTER — Encounter: Payer: Self-pay | Admitting: Internal Medicine

## 2023-06-19 ENCOUNTER — Ambulatory Visit (HOSPITAL_COMMUNITY): Payer: Medicare Other

## 2023-06-19 ENCOUNTER — Other Ambulatory Visit: Payer: Medicare Other

## 2023-06-19 ENCOUNTER — Telehealth: Payer: Self-pay | Admitting: *Deleted

## 2023-06-19 NOTE — Telephone Encounter (Signed)
Please see MYCHART message and reply. TY.

## 2023-06-19 NOTE — Telephone Encounter (Signed)
Converted to phone message from [place] message (sick message):  Please call in antibiotic [facility]. Having a flare up and very congestive. Any questions call [phone]. Thank you.

## 2023-06-20 MED ORDER — CIPROFLOXACIN HCL 500 MG PO TABS: 500.0000 mg | ORAL_TABLET | Freq: Two times a day (BID) | ORAL | 0 refills | Status: AC

## 2023-06-20 MED ORDER — PREDNISONE 10 MG PO TABS: ORAL_TABLET | ORAL | 0 refills | Status: DC

## 2023-06-20 NOTE — Telephone Encounter (Signed)
He is growing another GNR  Plan - Cipro 500mg  bid x 7 days - watch for tendonitis  - Take prednisone 40 mg daily x 2 days, then 30mg  daily x 2 days, then 20mg  daily x 2 days, then baseline dose      Range & Units (hover) 2 wk ago  MICRO NUMBER: [id number]  SPECIMEN QUALITY: Adequate  Source SPUTUM  STATUS: FINAL  GRAM STAIN:  Abnormal  Few epithelial cells No white blood cells seen Moderate Gram positive bacilli Moderate Gram negative bacilli  ISOLATE 1: Achromobacter (Alcaligenes) xylosoxidans  Comment: Light growth of Achromobacter (Alcaligenes) xylosoxidans  Resulting Agency [facility] [place]     Susceptibility   Achromobacter (alcaligenes) xylosoxidans    RESPIRATORY/SPUTUM CULT NEGATIVE 1    CEFEPIME >=64 Resistant    CEFTAZIDIME 8 Sensitive    CIPROFLOXACIN 1 Sensitive    GENTAMICIN >=16 Resistant    IMIPENEM 1 Sensitive    LEVOFLOXACIN 1 Sensitive    PIP/TAZO 64 Intermediate    TOBRAMYCIN 8 Intermediate    TRIMETH/SULFA <=20 Sensitive 1

## 2023-06-20 NOTE — Telephone Encounter (Signed)
Called and spoke with the pt He c/o increased SOB, wheezing, prod cough with green sputum x 1 wk  He is asking for pred and abx  He denies any fevers, aches  He is not using albuterol neb bc it's not helping  Still taking his symbicort and spiriva  Please advise, thank you  Allergies  Allergen Reactions   Tape Other (See Comments)    Skin tears easily.   Augmentin [Amoxicillin-Pot Clavulanate] Other (See Comments)    Thrush Sore throat Hoarse voice   Cipro [Ciprofloxacin Hcl] Nausea And Vomiting    Weakness Fatigue    Gadavist [Gadobutrol] Other (See Comments)    Unknown reaction   Gadolinium Rash    Itchy back rash   Levaquin [Levofloxacin] Other (See Comments)    Hallucinations    Lipitor [Atorvastatin] Rash

## 2023-06-20 NOTE — Telephone Encounter (Signed)
Pt is aware of below message/recommendations and voiced his understanding.  Cipro and prednisone has been sent to preferred pharmacy. Pt stated that he has taken cipro previously without reaction.  Nothing further needed.

## 2023-06-21 NOTE — Telephone Encounter (Signed)
Mychart message turned to acute message. Patient's needs addressed on PM of 06/20/23.

## 2023-06-23 ENCOUNTER — Ambulatory Visit: Payer: Medicare Other | Admitting: Internal Medicine

## 2023-06-26 ENCOUNTER — Telehealth: Payer: Medicare Other | Admitting: Internal Medicine

## 2023-07-07 ENCOUNTER — Telehealth: Payer: Self-pay | Admitting: Internal Medicine

## 2023-07-07 MED ORDER — PREDNISONE 10 MG PO TABS: ORAL_TABLET | ORAL | 0 refills | Status: DC

## 2023-07-07 MED ORDER — CIPROFLOXACIN HCL 750 MG PO TABS: 750.0000 mg | ORAL_TABLET | Freq: Two times a day (BID) | ORAL | 0 refills | Status: DC

## 2023-07-07 NOTE — Telephone Encounter (Signed)
I called and spoke with the pt's spouse and notified of response per MR  She verbalized understanding  I have sent rxs to pharm  Nothing further needed

## 2023-07-07 NOTE — Telephone Encounter (Signed)
Patient is on 4.5 liters of oxygen and would like a humidifier bottle to be hooked up to the concentrator in order to help with his shortness of breath.

## 2023-07-07 NOTE — Telephone Encounter (Signed)
He might need admission given drops to the 70s and the typle of organisms in the lung. Or else all I can do from office   Is  Ciprofloxacin 750mg  bid x 12 days again\   Please take Take prednisone 40mg  once daily x 3 days, then 30mg  once daily x 3 days, then 20mg  once daily x 3 days, then prednisone 10mg  once daily  x 3 days and baselienie dose to continue

## 2023-07-07 NOTE — Telephone Encounter (Addendum)
Called and spoke with the pt  He c/o nasal and chest congestion worsening over the past couple of weeks  He is coughing up brown sputum  No fevers  He has had some wheezing  When he walks to the potty chair sats drop into the 70's on his 4lpm o2  No appts open  MR- please advise, thanks!  Pt still taking flonase, pred 10 mg, zithro MWF, albuterol nebs, spiriva, symbicort  Please advise, thanks!  Allergies  Allergen Reactions   Tape Other (See Comments)    Skin tears easily.   Augmentin [Amoxicillin-Pot Clavulanate] Other (See Comments)    Thrush Sore throat Hoarse voice   Cipro [Ciprofloxacin Hcl] Nausea And Vomiting    Weakness Fatigue    Gadavist [Gadobutrol] Other (See Comments)    Unknown reaction   Gadolinium Rash    Itchy back rash   Levaquin [Levofloxacin] Other (See Comments)    Hallucinations    Lipitor [Atorvastatin] Rash

## 2023-07-09 ENCOUNTER — Encounter (HOSPITAL_COMMUNITY): Payer: Self-pay | Admitting: Internal Medicine

## 2023-07-09 ENCOUNTER — Encounter: Payer: Self-pay | Admitting: Internal Medicine

## 2023-07-09 ENCOUNTER — Other Ambulatory Visit: Payer: Self-pay

## 2023-07-09 ENCOUNTER — Emergency Department (HOSPITAL_COMMUNITY): Payer: Medicare Other

## 2023-07-09 ENCOUNTER — Inpatient Hospital Stay (HOSPITAL_COMMUNITY)
Admission: EM | Admit: 2023-07-09 | Discharge: 2023-07-14 | DRG: 189 | Disposition: A | Discharge status: Home / Self Care | Payer: Medicare Other | Attending: Internal Medicine | Admitting: Internal Medicine

## 2023-07-09 DIAGNOSIS — Z823 Family history of stroke: Secondary | ICD-10-CM

## 2023-07-09 DIAGNOSIS — Z2239 Carrier of other specified bacterial diseases: Secondary | ICD-10-CM

## 2023-07-09 DIAGNOSIS — J439 Emphysema, unspecified: Secondary | ICD-10-CM | POA: Diagnosis present

## 2023-07-09 DIAGNOSIS — Z8249 Family history of ischemic heart disease and other diseases of the circulatory system: Secondary | ICD-10-CM

## 2023-07-09 DIAGNOSIS — Z1152 Encounter for screening for COVID-19: Secondary | ICD-10-CM

## 2023-07-09 DIAGNOSIS — I2782 Chronic pulmonary embolism: Secondary | ICD-10-CM | POA: Diagnosis present

## 2023-07-09 DIAGNOSIS — D649 Anemia, unspecified: Secondary | ICD-10-CM | POA: Diagnosis not present

## 2023-07-09 DIAGNOSIS — Z9981 Dependence on supplemental oxygen: Secondary | ICD-10-CM

## 2023-07-09 DIAGNOSIS — J9621 Acute and chronic respiratory failure with hypoxia: Principal | ICD-10-CM | POA: Diagnosis present

## 2023-07-09 DIAGNOSIS — I5022 Chronic systolic (congestive) heart failure: Secondary | ICD-10-CM | POA: Diagnosis present

## 2023-07-09 DIAGNOSIS — Z923 Personal history of irradiation: Secondary | ICD-10-CM

## 2023-07-09 DIAGNOSIS — Z881 Allergy status to other antibiotic agents status: Secondary | ICD-10-CM

## 2023-07-09 DIAGNOSIS — Z7952 Long term (current) use of systemic steroids: Secondary | ICD-10-CM

## 2023-07-09 DIAGNOSIS — I739 Peripheral vascular disease, unspecified: Secondary | ICD-10-CM | POA: Diagnosis present

## 2023-07-09 DIAGNOSIS — C3431 Malignant neoplasm of lower lobe, right bronchus or lung: Secondary | ICD-10-CM | POA: Diagnosis present

## 2023-07-09 DIAGNOSIS — J441 Chronic obstructive pulmonary disease with (acute) exacerbation: Principal | ICD-10-CM | POA: Diagnosis present

## 2023-07-09 DIAGNOSIS — J9622 Acute and chronic respiratory failure with hypercapnia: Secondary | ICD-10-CM | POA: Diagnosis present

## 2023-07-09 DIAGNOSIS — Z87891 Personal history of nicotine dependence: Secondary | ICD-10-CM

## 2023-07-09 DIAGNOSIS — I10 Essential (primary) hypertension: Secondary | ICD-10-CM | POA: Diagnosis present

## 2023-07-09 DIAGNOSIS — I444 Left anterior fascicular block: Secondary | ICD-10-CM | POA: Diagnosis present

## 2023-07-09 DIAGNOSIS — E785 Hyperlipidemia, unspecified: Secondary | ICD-10-CM | POA: Diagnosis present

## 2023-07-09 DIAGNOSIS — Z8774 Personal history of (corrected) congenital malformations of heart and circulatory system: Secondary | ICD-10-CM

## 2023-07-09 DIAGNOSIS — I110 Hypertensive heart disease with heart failure: Secondary | ICD-10-CM | POA: Diagnosis present

## 2023-07-09 DIAGNOSIS — Z79899 Other long term (current) drug therapy: Secondary | ICD-10-CM

## 2023-07-09 DIAGNOSIS — Z888 Allergy status to other drugs, medicaments and biological substances status: Secondary | ICD-10-CM

## 2023-07-09 DIAGNOSIS — Z85118 Personal history of other malignant neoplasm of bronchus and lung: Secondary | ICD-10-CM

## 2023-07-09 DIAGNOSIS — S51011A Laceration without foreign body of right elbow, initial encounter: Secondary | ICD-10-CM | POA: Diagnosis present

## 2023-07-09 DIAGNOSIS — R351 Nocturia: Secondary | ICD-10-CM | POA: Diagnosis present

## 2023-07-09 DIAGNOSIS — Z7901 Long term (current) use of anticoagulants: Secondary | ICD-10-CM

## 2023-07-09 DIAGNOSIS — S51012A Laceration without foreign body of left elbow, initial encounter: Secondary | ICD-10-CM | POA: Insufficient documentation

## 2023-07-09 DIAGNOSIS — N401 Enlarged prostate with lower urinary tract symptoms: Secondary | ICD-10-CM | POA: Diagnosis present

## 2023-07-09 DIAGNOSIS — E874 Mixed disorder of acid-base balance: Secondary | ICD-10-CM | POA: Diagnosis present

## 2023-07-09 DIAGNOSIS — D62 Acute posthemorrhagic anemia: Secondary | ICD-10-CM | POA: Diagnosis present

## 2023-07-09 DIAGNOSIS — J9611 Chronic respiratory failure with hypoxia: Secondary | ICD-10-CM | POA: Diagnosis present

## 2023-07-09 DIAGNOSIS — R0602 Shortness of breath: Secondary | ICD-10-CM | POA: Diagnosis not present

## 2023-07-09 DIAGNOSIS — G250 Essential tremor: Secondary | ICD-10-CM | POA: Diagnosis present

## 2023-07-09 DIAGNOSIS — Z85828 Personal history of other malignant neoplasm of skin: Secondary | ICD-10-CM

## 2023-07-09 DIAGNOSIS — S41112A Laceration without foreign body of left upper arm, initial encounter: Secondary | ICD-10-CM | POA: Diagnosis present

## 2023-07-09 DIAGNOSIS — Z7951 Long term (current) use of inhaled steroids: Secondary | ICD-10-CM

## 2023-07-09 DIAGNOSIS — Z66 Do not resuscitate: Secondary | ICD-10-CM | POA: Diagnosis present

## 2023-07-09 LAB — BASIC METABOLIC PANEL
Anion gap: 10 (ref 5–15)
BUN: 15 mg/dL (ref 8–23)
CO2: 42 mmol/L — ABNORMAL HIGH (ref 22–32)
Calcium: 9.4 mg/dL (ref 8.9–10.3)
Chloride: 89 mmol/L — ABNORMAL LOW (ref 98–111)
Creatinine, Ser: 0.99 mg/dL (ref 0.61–1.24)
GFR, Estimated: >60 mL/min (ref >60)
Glucose, Bld: 128 mg/dL — ABNORMAL HIGH (ref 70–99)
Potassium: 4.2 mmol/L (ref 3.5–5.1)
Sodium: 141 mmol/L (ref 135–145)

## 2023-07-09 LAB — LACTIC ACID, PLASMA
Lactic Acid, Venous: 1.9 mmol/L (ref 0.5–1.9)
Lactic Acid, Venous: 2.2 mmol/L (ref 0.5–1.9)

## 2023-07-09 LAB — CBC
HCT: 31 % — ABNORMAL LOW (ref 39.0–52.0)
Hemoglobin: 9.2 g/dL — ABNORMAL LOW (ref 13.0–17.0)
MCH: 29.9 pg (ref 26.0–34.0)
MCHC: 29.7 g/dL — ABNORMAL LOW (ref 30.0–36.0)
MCV: 100.6 fL — ABNORMAL HIGH (ref 80.0–100.0)
Platelets: 167 10*3/uL (ref 150–400)
RBC: 3.08 MIL/uL — ABNORMAL LOW (ref 4.22–5.81)
RDW: 12.8 % (ref 11.5–15.5)
WBC: 10.4 10*3/uL (ref 4.0–10.5)
nRBC: 0 % (ref 0.0–0.2)

## 2023-07-09 LAB — RESP PANEL BY RT-PCR (RSV, FLU A&B, COVID)  RVPGX2
Influenza A by PCR: NEGATIVE
Influenza B by PCR: NEGATIVE
Resp Syncytial Virus by PCR: NEGATIVE
SARS Coronavirus 2 by RT PCR: NEGATIVE

## 2023-07-09 MED ORDER — PANTOPRAZOLE SODIUM 20 MG PO TBEC
20.0000 mg | DELAYED_RELEASE_TABLET | Freq: Every day | ORAL | Status: DC
  Administered 2023-07-10 – 2023-07-14 (×5): 20 mg via ORAL
  Filled 2023-07-09 (×5): qty 1

## 2023-07-09 MED ORDER — FUROSEMIDE 20 MG PO TABS: 20.0000 mg | ORAL_TABLET | Freq: Every day | ORAL | Status: DC

## 2023-07-09 MED ORDER — AZITHROMYCIN 500 MG PO TABS
500.0000 mg | ORAL_TABLET | Freq: Every day | ORAL | Status: DC
  Filled 2023-07-09: qty 1

## 2023-07-09 MED ORDER — IPRATROPIUM BROMIDE 0.02 % IN SOLN
0.5000 mg | Freq: Four times a day (QID) | RESPIRATORY_TRACT | Status: DC
Start: 2023-07-09 — End: 2023-07-10
  Administered 2023-07-09 – 2023-07-10 (×3): 0.5 mg via RESPIRATORY_TRACT
  Filled 2023-07-09 (×3): qty 2.5

## 2023-07-09 MED ORDER — PREDNISONE 20 MG PO TABS
40.0000 mg | ORAL_TABLET | Freq: Every day | ORAL | Status: DC
  Administered 2023-07-10: 40 mg via ORAL
  Filled 2023-07-09: qty 2

## 2023-07-09 MED ORDER — SODIUM CHLORIDE 0.9% FLUSH
3.0000 mL | Freq: Two times a day (BID) | INTRAVENOUS | Status: DC
  Administered 2023-07-09 – 2023-07-12 (×6): 3 mL via INTRAVENOUS

## 2023-07-09 MED ORDER — POLYVINYL ALCOHOL 1.4 % OP SOLN
1.0000 [drp] | OPHTHALMIC | Status: DC
  Administered 2023-07-10 – 2023-07-14 (×3): 1 [drp] via OPHTHALMIC
  Filled 2023-07-09: qty 15

## 2023-07-09 MED ORDER — UMECLIDINIUM BROMIDE 62.5 MCG/ACT IN AEPB
1.0000 | INHALATION_SPRAY | Freq: Every day | RESPIRATORY_TRACT | Status: DC
  Filled 2023-07-09: qty 7

## 2023-07-09 MED ORDER — ACETAMINOPHEN 650 MG RE SUPP: 650.0000 mg | Freq: Four times a day (QID) | RECTAL | Status: DC | PRN

## 2023-07-09 MED ORDER — POLYETHYL GLYCOL-PROPYL GLYCOL 0.4-0.3 % OP SOLN: 1.0000 [drp] | OPHTHALMIC | Status: DC

## 2023-07-09 MED ORDER — TIOTROPIUM BROMIDE MONOHYDRATE 2.5 MCG/ACT IN AERS: 2.0000 | INHALATION_SPRAY | Freq: Every day | RESPIRATORY_TRACT | Status: DC

## 2023-07-09 MED ORDER — ALBUTEROL SULFATE (2.5 MG/3ML) 0.083% IN NEBU: 2.5000 mg | INHALATION_SOLUTION | RESPIRATORY_TRACT | Status: DC | PRN

## 2023-07-09 MED ORDER — ACETAMINOPHEN 325 MG PO TABS
650.0000 mg | ORAL_TABLET | Freq: Four times a day (QID) | ORAL | Status: DC | PRN
Start: 2023-07-09 — End: 2023-07-14
  Administered 2023-07-10: 650 mg via ORAL
  Filled 2023-07-09: qty 2

## 2023-07-09 MED ORDER — MOMETASONE FURO-FORMOTEROL FUM 200-5 MCG/ACT IN AERO
2.0000 | INHALATION_SPRAY | Freq: Two times a day (BID) | RESPIRATORY_TRACT | Status: DC
  Filled 2023-07-09: qty 8.8

## 2023-07-09 MED ORDER — ROFLUMILAST 500 MCG PO TABS
500.0000 ug | ORAL_TABLET | Freq: Every day | ORAL | Status: DC
  Administered 2023-07-10 – 2023-07-14 (×5): 500 ug via ORAL
  Filled 2023-07-09 (×5): qty 1

## 2023-07-09 MED ORDER — AZITHROMYCIN 500 MG IV SOLR
500.0000 mg | Freq: Once | INTRAVENOUS | Status: AC
  Administered 2023-07-09: 500 mg via INTRAVENOUS
  Filled 2023-07-09: qty 5

## 2023-07-09 MED ORDER — IPRATROPIUM-ALBUTEROL 0.5-2.5 (3) MG/3ML IN SOLN
3.0000 mL | Freq: Once | RESPIRATORY_TRACT | Status: AC
Start: 2023-07-09 — End: 2023-07-09
  Administered 2023-07-09: 3 mL via RESPIRATORY_TRACT
  Filled 2023-07-09: qty 3

## 2023-07-09 MED ORDER — SODIUM CHLORIDE 0.9 % IV BOLUS
500.0000 mL | Freq: Once | INTRAVENOUS | Status: AC
  Administered 2023-07-09: 500 mL via INTRAVENOUS

## 2023-07-09 MED ORDER — POLYETHYLENE GLYCOL 3350 17 G PO PACK: 17.0000 g | PACK | Freq: Every day | ORAL | Status: DC | PRN

## 2023-07-09 MED ORDER — METHYLPREDNISOLONE SODIUM SUCC 125 MG IJ SOLR
125.0000 mg | Freq: Two times a day (BID) | INTRAMUSCULAR | Status: AC
  Administered 2023-07-09 – 2023-07-10 (×2): 125 mg via INTRAVENOUS
  Filled 2023-07-09 (×2): qty 2

## 2023-07-09 MED ORDER — ROSUVASTATIN CALCIUM 20 MG PO TABS
20.0000 mg | ORAL_TABLET | Freq: Every evening | ORAL | Status: DC
  Administered 2023-07-09 – 2023-07-13 (×4): 20 mg via ORAL
  Filled 2023-07-09 (×4): qty 1

## 2023-07-09 MED ORDER — APIXABAN 2.5 MG PO TABS
2.5000 mg | ORAL_TABLET | Freq: Two times a day (BID) | ORAL | Status: DC
  Administered 2023-07-09 – 2023-07-14 (×10): 2.5 mg via ORAL
  Filled 2023-07-09 (×10): qty 1

## 2023-07-09 NOTE — ED Notes (Signed)
 ED TO INPATIENT HANDOFF REPORT  ED Nurse Name and Phone #: Carlyon GLADE RN [phone]  S Name/Age/Gender Francisco [name] 78 y.o. male Room/Bed: 002C/002C  Code Status   Code Status: Full Code  Home/SNF/Other Home Patient oriented to: self, place, time, and situation Is this baseline? Yes   Triage Complete: Triage complete  Chief Complaint Acute on chronic respiratory failure with hypoxia (HCC) [J96.21]  Triage Note Pt arrived via [facility] from home for c/o SOB and productive coughx2wks. Pt has productive cough w/yellow/green mucous. Pt seen PCP 2-3 days ago and was given an oral atx, but has not helped. Pt has large skin tear on right arm from EMS catching pt as they were moving him over and preventing him from falling. Pt wears 5L 02 at baseline. Pt's legs are purple bilat.   Allergies Allergies  Allergen Reactions   Tape Other (See Comments)    Skin tears easily   Augmentin  [Amoxicillin -Pot Clavulanate] Other (See Comments)    Thrush Sore throat Hoarse voice   Gadavist  [Gadobutrol ] Other (See Comments)    Unknown reaction   Gadolinium Itching, Rash and Other (See Comments)    Itchy back rash   Levaquin  [Levofloxacin ] Other (See Comments)    Hallucinations    Lipitor [Atorvastatin ] Rash    Level of Care/Admitting Diagnosis ED Disposition     ED Disposition  Admit   Condition  --   Comment  Hospital Area: [facility] [[id number]]  Level of Care: Progressive [102]  Admit to Progressive based on following criteria: RESPIRATORY PROBLEMS hypoxemic/hypercapnic respiratory failure that is responsive to NIPPV (BiPAP) or High Flow Nasal Cannula (6-80 lpm). Frequent assessment/intervention, no > Q2 hrs < Q4 hrs, to maintain oxygenation and pulmonary hygiene.  May place patient in observation at [facility] or [facility] if equivalent level of care is available:: No  Covid Evaluation: Asymptomatic - no recent exposure (last 10 days) testing not required  Diagnosis:  Acute on chronic respiratory failure with hypoxia [facility]) [[id number]]  Admitting Physician: MELVIN, ALEXANDER B [[id number]]  Attending Physician: MELVIN, ALEXANDER B [[id number]]          B Medical/Surgery History Past Medical History:  Diagnosis Date   Acute GI bleeding 02/18/2020   Melena with hemoglobin down to 10 February 2020.  EGD with intervention-follow-up with Dr. Wilhelmenia     Chronic back pain    mid and lower (04/07/2018)   Chronic rhinitis    -Sinus Ct 08/01/2009 >> Bilateral maxillary sinusitis with some mucosal thickeningin the sphenoid and frontal sinuses as well with air fluid levels present -chronic rhinitis flyer Aug 04, 2009   Compressed spine fracture [facility])    COPD (chronic obstructive pulmonary disease) (HCC)    PFT's rec Jul 17, 2009   Dyspnea    Emphysema lung (HCC)    Hyperglycemia 06/12/2016   Iron  deficiency anemia due to chronic blood loss 08/23/2020   Left ankle swelling 07/22/2014   HTN- controlled when fluid status contolled. on triamterene -hctz 37.5-25mg  sparingly  Unclear etiology.   LLE U/S 2016:     Leukocytosis 12/19/2016   Melena 08/11/2022   On home oxygen therapy    3L; 24/7 (04/07/2018)   Onychomycosis    Dr. Naomi   Orthostatic hypotension    since 10/2017 (04/07/2018)   PAD (peripheral artery disease) (HCC)    Pneumonia    twice in 1 year (04/07/2018)   Pulmonary embolism (HCC) 04/07/2018   Skin cancer    lips, face, ears,  arms (04/07/2018)   Small cell lung cancer, right (HCC) 2023   Dr. Dewey.  rad   Vertigo    since ~ 02/2018 (04/07/2018)   Past Surgical History:  Procedure Laterality Date   ABDOMINAL AORTOGRAM W/LOWER EXTREMITY N/A 01/21/2020   Procedure: ABDOMINAL AORTOGRAM W/LOWER EXTREMITY;  Surgeon: Harvey Carlin BRAVO, MD;  [facility]: [facility] INVASIVE CV LAB;  Service: Cardiovascular;  Laterality: N/A;   APPENDECTOMY     BIOPSY  02/20/2020   Procedure: BIOPSY;  Surgeon: Wilhelmenia Aloha Raddle., MD;  Location: [facility] ENDOSCOPY;   Service: Gastroenterology;;   BIOPSY  08/12/2022   Procedure: BIOPSY;  Surgeon: San Sandor GAILS, DO;  Location: [facility] ENDOSCOPY;  Service: Gastroenterology;;   BRONCHIAL BIOPSY  09/04/2021   Procedure: BRONCHIAL BIOPSIES;  Surgeon: Brenna Adine CROME, DO;  Location: [facility] ENDOSCOPY;  Service: Pulmonary;;   BRONCHIAL BRUSHINGS  09/04/2021   Procedure: BRONCHIAL BRUSHINGS;  Surgeon: Brenna Adine CROME, DO;  Location: [facility] ENDOSCOPY;  Service: Pulmonary;;   BRONCHIAL NEEDLE ASPIRATION BIOPSY  09/04/2021   Procedure: BRONCHIAL NEEDLE ASPIRATION BIOPSIES;  Surgeon: Brenna Adine CROME, DO;  Location: [facility] ENDOSCOPY;  Service: Pulmonary;;   CATARACT EXTRACTION, BILATERAL     ENDARTERECTOMY FEMORAL Right 04/19/2019   Procedure: ENDARTERECTOMY RIGHT COMMON FEMORAL;  Surgeon: Harvey Carlin BRAVO, MD;  Location: [facility] OR;  Service: Vascular;  Laterality: Right;   ENDARTERECTOMY FEMORAL Left 01/24/2020   Procedure: LEFT FEMORAL ENDARTERECTOMY WITH DACRON PATCH ANGIOPLASTY;  Surgeon: Harvey Carlin BRAVO, MD;  Location: [facility] OR;  Service: Vascular;  Laterality: Left;   ESOPHAGOGASTRODUODENOSCOPY (EGD) WITH PROPOFOL  N/A 02/20/2020   Procedure: ESOPHAGOGASTRODUODENOSCOPY (EGD) WITH PROPOFOL ;  Surgeon: Wilhelmenia Aloha Raddle., MD;  Location: [facility] ENDOSCOPY;  Service: Gastroenterology;  Laterality: N/A;   ESOPHAGOGASTRODUODENOSCOPY (EGD) WITH PROPOFOL  N/A 08/12/2022   Procedure: ESOPHAGOGASTRODUODENOSCOPY (EGD) WITH PROPOFOL ;  Surgeon: San Sandor GAILS, DO;  [facility]: [facility] ENDOSCOPY;  Service: Gastroenterology;  Laterality: N/A;   FEMORAL ENDARTERECTOMY Left 01/24/2020   FIDUCIAL MARKER PLACEMENT  09/04/2021   Procedure: FIDUCIAL MARKER PLACEMENT;  Surgeon: Brenna Adine CROME, DO;  Location: [facility] ENDOSCOPY;  Service: Pulmonary;;   HEMOSTASIS CLIP PLACEMENT  02/20/2020   Procedure: HEMOSTASIS CLIP PLACEMENT;  Surgeon: Wilhelmenia Aloha Raddle., MD;  Location: [facility] ENDOSCOPY;  Service: Gastroenterology;;   HEMOSTASIS CLIP PLACEMENT  08/12/2022   Procedure:  HEMOSTASIS CLIP PLACEMENT;  Surgeon: San Sandor GAILS, DO;  [facility] ENDOS[facility];  Service: Gastroenterology;;   HOT HEMOSTASIS N/A 02/20/2020   Procedure: HOT HEMOSTASIS (ARGON PLASMA COAGULATION/BICAP);  Surgeon: Wilhelmenia Aloha Raddle., MD;  Location: [facility] ENDOSCOPY;  Service: Gastroenterology;  Laterality: N/A;   HOT HEMOSTASIS N/A 08/12/2022   Procedure: HOT HEMOSTASIS (ARGON PLASMA COAGULATION/BICAP);  Surgeon: San Sandor GAILS, DO;  Location: [facility] ENDOSCOPY;  Service: Gastroenterology;  Laterality: N/A;   INSERTION OF ILIAC STENT Left 01/24/2020   Procedure: INSERTION OF VBX STENT 8X59 AND 8X39 LEFT COMMON ILIAC ARTERY. INSERTION OF INNOVA 7 X 60 INNOVA STENT LEFT EXTERNAL ILIAC ARTERY. ;  Surgeon: Harvey Carlin BRAVO, MD;  Location: [facility] OR;  Service: Vascular;  Laterality: Left;   LOWER EXTREMITY ANGIOGRAPHY  12/11/2018   LOWER EXTREMITY ANGIOGRAPHY N/A 12/11/2018   Procedure: LOWER EXTREMITY ANGIOGRAPHY;  Surgeon: Harvey Carlin BRAVO, MD;  [facility];  Service: Cardiovascular;  Laterality: N/A;   PATCH ANGIOPLASTY Right 04/19/2019   Procedure: Patch Angioplasty;  Surgeon: Harvey Carlin BRAVO, MD;  Location: [facility] OR;  Service: Vascular;  Laterality: Right;   PERIPHERAL VASCULAR INTERVENTION Right 12/11/2018   Procedure: PERIPHERAL VASCULAR INTERVENTION;  Surgeon: Harvey Carlin BRAVO, MD;  Location: [facility] INVASIVE CV LAB;  Service: Cardiovascular;  Laterality: Right;  Common Iliac    SKIN CANCER EXCISION     lips, face, ears, arms (04/07/2018)   SUBMUCOSAL INJECTION  08/12/2022   Procedure: SUBMUCOSAL INJECTION;  Surgeon: San Sandor GAILS, DO;  Location: [facility] ENDOSCOPY;  Service: Gastroenterology;;   TONSILLECTOMY     ULTRASOUND GUIDANCE FOR VASCULAR ACCESS Right 01/24/2020   Procedure: ULTRASOUND GUIDANCE FOR VASCULAR ACCESS;  Surgeon: Harvey Carlin BRAVO, MD;  Location: [facility] OR;  Service: Vascular;  Laterality: Right;   VIDEO BRONCHOSCOPY WITH RADIAL ENDOBRONCHIAL ULTRASOUND  09/04/2021    Procedure: VIDEO BRONCHOSCOPY WITH RADIAL ENDOBRONCHIAL ULTRASOUND;  Surgeon: Brenna Adine CROME, DO;  Location: [facility] ENDOSCOPY;  Service: Pulmonary;;     A IV Location/Drains/Wounds Patient Lines/Drains/Airways Status     Active Line/Drains/Airways     Name Placement date Placement time Site Days   Peripheral IV 07/09/23 20 G Distal;Posterior;Right Forearm 07/09/23  1045  Forearm  less than 1            Intake/Output Last 24 hours No intake or output data in the 24 hours ending 07/09/23 1710  Labs/Imaging Results for orders placed or performed during the hospital encounter of 07/09/23 (from the past 48 hours)  CBC     Status: Abnormal   Collection Time: 07/09/23 10:01 AM  Result Value Ref Range   WBC 10.4 4.0 - 10.5 K/uL   RBC 3.08 (L) 4.22 - 5.81 MIL/uL   Hemoglobin 9.2 (L) 13.0 - 17.0 g/dL   HCT 68.9 (L) 60.9 - 47.9 %   MCV 100.6 (H) 80.0 - 100.0 fL   MCH 29.9 26.0 - 34.0 pg   MCHC 29.7 (L) 30.0 - 36.0 g/dL   RDW 87.1 88.4 - 84.4 %   Platelets 167 150 - 400 K/uL   nRBC 0.0 0.0 - 0.2 %    Comment: Performed at [facility], [address] [place], [place] [address]  Basic metabolic panel     Status: Abnormal   Collection Time: 07/09/23 10:01 AM  Result Value Ref Range   Sodium 141 135 - 145 mmol/L   Potassium 4.2 3.5 - 5.1 mmol/L   Chloride 89 (L) 98 - 111 mmol/L   CO2 42 (H) 22 - 32 mmol/L   Glucose, Bld 128 (H) 70 - 99 mg/dL    Comment: Glucose reference range applies only to samples taken after fasting for at least 8 hours.   BUN 15 8 - 23 mg/dL   Creatinine, Ser 9.00 0.61 - 1.24 mg/dL   Calcium  9.4 8.9 - 10.3 mg/dL   GFR, Estimated >39 >39 mL/min    Comment: (NOTE) Calculated using the CKD-EPI Creatinine Equation (2021)    Anion gap 10 5 - 15    Comment: Performed at [facility], [address], [place], [place] [address]  Lactic acid, plasma     Status: None   Collection Time: 07/09/23 10:01 AM  Result Value Ref Range   Lactic Acid, Venous  1.9 0.5 - 1.9 mmol/L    Comment: Performed at [facility], [address] [place], [place] [address]  Resp panel by RT-PCR (RSV, Flu A&B, Covid)     Status: None   Collection Time: 07/09/23 10:01 AM   Specimen: Nasal Swab  Result Value Ref Range   SARS Coronavirus 2 by RT PCR NEGATIVE NEGATIVE   Influenza A by PCR NEGATIVE NEGATIVE  Influenza B by PCR NEGATIVE NEGATIVE    Comment: (NOTE) The Xpert Xpress SARS-CoV-2/FLU/RSV plus assay is intended as an aid in the diagnosis of influenza from Nasopharyngeal swab specimens and should not be used as a sole basis for treatment. Nasal washings and aspirates are unacceptable for Xpert Xpress SARS-CoV-2/FLU/RSV testing.  Fact Sheet for Patients: bloggercourse.com  Fact Sheet for Healthcare Providers: seriousbroker.it  This test is not yet approved or cleared by the [place]  FDA and has been authorized for detection and/or diagnosis of SARS-CoV-2 by FDA under an Emergency Use Authorization (EUA). This EUA will remain in effect (meaning this test can be used) for the duration of the COVID-19 declaration under Section 564(b)(1) of the Act, 21 U.S.C. section 360bbb-3(b)(1), unless the authorization is terminated or revoked.     Resp Syncytial Virus by PCR NEGATIVE NEGATIVE    Comment: (NOTE) Fact Sheet for Patients: bloggercourse.com  Fact Sheet for Healthcare Providers: seriousbroker.it  This test is not yet approved or cleared by the [place]  FDA and has been authorized for detection and/or diagnosis of SARS-CoV-2 by FDA under an Emergency Use Authorization (EUA). This EUA will remain in effect (meaning this test can be used) for the duration of the COVID-19 declaration under Section 564(b)(1) of the Act, 21 U.S.C. section 360bbb-3(b)(1), unless the authorization is terminated or revoked.  Performed at [facility], [address] [place], [place] [address]   Lactic acid, plasma     Status: Abnormal   Collection Time: 07/09/23 12:49 PM  Result Value Ref Range   Lactic Acid, Venous 2.2 (HH) 0.5 - 1.9 mmol/L    Comment: CRITICAL RESULT CALLED TO, READ BACK BY AND VERIFIED WITH P.Vershawn Westrup RN @ 1350 01.01.25 E.AHMED Performed at [facility], [address] [place], [place] [address]    *Note: Due to a large number of results and/or encounters for the requested time period, some results have not been displayed. A complete set of results can be found in Results Review.   DG Chest Portable 1 View Result Date: 07/09/2023 CLINICAL DATA:  COPD, shortness of breath, cough EXAM: PORTABLE CHEST - 1 VIEW COMPARISON:  02/20/2023 FINDINGS: Chronic biapical pleuroparenchymal scarring. Somewhat coarse pulmonary interstitial markings as before. Fiducial marker projects over the right hilum as before. No new infiltrate or overt edema. Heart size and mediastinal contours are within normal limits. Aortic Atherosclerosis (ICD10-170.0). No effusion. Visualized bones unremarkable. IMPRESSION: Chronic changes without acute findings. Electronically Signed   By: JONETTA Faes M.D.   On: 07/09/2023 10:11    Pending Labs Unresulted Labs (From admission, onward)     Start     Ordered   07/10/23 0500  Comprehensive metabolic panel  Tomorrow morning,   R        07/09/23 1503   07/10/23 0500  CBC  Tomorrow morning,   R        07/09/23 1503   07/09/23 1459  Respiratory (~20 pathogens) panel by PCR  (COPD / Pneumonia / Cellulitis / Lower Extremity Wound)  Add-on,   AD        07/09/23 1503   07/09/23 0935  Culture, blood (routine x 2)  BLOOD CULTURE X 2,   R (with STAT occurrences)      07/09/23 0936            Vitals/Pain Today's Vitals   07/09/23 1337 07/09/23 1342 07/09/23 1409 07/09/23 1545  BP:   130/70 123/68  Pulse: (!) 115 (!)  118 (!) 58 (!) 120  Resp: 20 18 (!) 24 (!) 21  Temp:   98.7 F (37.1 C)    TempSrc:   Oral   SpO2: 91% 94% 99% 100%    Isolation Precautions Droplet precaution  Medications Medications  furosemide  (LASIX ) tablet 20 mg (has no administration in time range)  rosuvastatin  (CRESTOR ) tablet 20 mg (has no administration in time range)  pantoprazole  (PROTONIX ) EC tablet 20 mg (has no administration in time range)  apixaban  (ELIQUIS ) tablet 2.5 mg (has no administration in time range)  mometasone -formoterol  (DULERA ) 200-5 MCG/ACT inhaler 2 puff (has no administration in time range)  roflumilast  (DALIRESP ) tablet 500 mcg (has no administration in time range)  azithromycin  (ZITHROMAX ) tablet 500 mg (has no administration in time range)  methylPREDNISolone  sodium succinate (SOLU-MEDROL ) 125 mg/2 mL injection 125 mg (125 mg Intravenous Given 07/09/23 1535)    Followed by  predniSONE  (DELTASONE ) tablet 40 mg (has no administration in time range)  ipratropium (ATROVENT ) nebulizer solution 0.5 mg (0.5 mg Nebulization Given 07/09/23 1538)  albuterol  (PROVENTIL ) (2.5 MG/3ML) 0.083% nebulizer solution 2.5 mg (has no administration in time range)  sodium chloride  flush (NS) 0.9 % injection 3 mL (0 mLs Intravenous Hold 07/09/23 1527)  acetaminophen  (TYLENOL ) tablet 650 mg (has no administration in time range)    Or  acetaminophen  (TYLENOL ) suppository 650 mg (has no administration in time range)  polyethylene glycol (MIRALAX  / GLYCOLAX ) packet 17 g (has no administration in time range)  polyvinyl alcohol  (LIQUIFILM TEARS) 1.4 % ophthalmic solution 1 drop (has no administration in time range)  umeclidinium bromide  (INCRUSE ELLIPTA ) 62.5 MCG/ACT 1 puff (has no administration in time range)  ipratropium-albuterol  (DUONEB) 0.5-2.5 (3) MG/3ML nebulizer solution 3 mL (3 mLs Nebulization Given 07/09/23 1052)  azithromycin  (ZITHROMAX ) 500 mg in sodium chloride  0.9 % 250 mL IVPB (0 mg Intravenous Stopped 07/09/23 1531)  sodium chloride  0.9 % bolus 500 mL (0 mLs Intravenous Stopped 07/09/23 1531)     Mobility walks with device     Focused Assessments Pulmonary Assessment Handoff:  Lung sounds: Bilateral Breath Sounds: Expiratory wheezes O2 Device: Simple Mask O2 Flow Rate (L/min): 4 L/min    R Recommendations: See Admitting Provider Note  Report given to: 4NP11

## 2023-07-09 NOTE — ED Triage Notes (Addendum)
 Pt arrived via [facility] from home for c/o SOB and productive coughx2wks. Pt has productive cough w/yellow/green mucous. Pt seen PCP 2-3 days ago and was given an oral atx, but has not helped. Pt has large skin tear on right arm from EMS catching pt as they were moving him over and preventing him from falling. Pt wears 5L 02 at baseline. Pt's legs are purple bilat.

## 2023-07-09 NOTE — ED Provider Notes (Signed)
 [place] [facility] AT [facility] Provider Note   CSN: [id number] Arrival date & time: 07/09/23  0920     History  Chief Complaint  Patient presents with   Shortness of Breath    Francisco [name] is a 78 y.o. male.  78 year old male presenting emergency department for shortness of breath.  He is COPD on oxygen at home.  Reports shortness of breath onset this morning.  Has had productive cough for the past week or 2.  Seemingly acutely worsened this morning.  Home oxygen level was low per their machine.  Took 40 mg of prednisone  prescribed to him by his pulmonologist.  Onnie a total of 4 puffs of his albuterol  with little improvement of his symptoms.  Received breathing treatment with EMS.  Reports feeling almost back to baseline.  He is a couple days into taking antibiotics.  He is unsure which.   Shortness of Breath      Home Medications Prior to Admission medications   Medication Sig Start Date End Date Taking? Authorizing Provider  acetaminophen  (TYLENOL ) 500 MG tablet Take 500 mg by mouth every 4 (four) hours as needed for moderate pain, fever, headache or mild pain.    [provider]  albuterol  (PROVENTIL ) (2.5 MG/3ML) 0.083% nebulizer solution Take 3 mLs (2.5 mg total) by nebulization every 6 (six) hours as needed for wheezing or shortness of breath. 08/17/22   Elgergawy, Brayton RAMAN, MD  azithromycin  (ZITHROMAX ) 250 MG tablet Take 250 mg by mouth See admin instructions. Continuous M,W,F 01/02/23   [provider]  Calcium  Carbonate-Vitamin D  (CALCIUM  600+D PO) Take 2 tablets by mouth daily.    [provider]  cefdinir  (OMNICEF ) 300 MG capsule Take 1 capsule (300 mg total) by mouth 2 (two) times daily. 05/06/23   Parrett, Madelin RAMAN, NP  ciprofloxacin  (CIPRO ) 750 MG tablet Take 1 tablet (750 mg total) by mouth 2 (two) times daily. 07/07/23   Geronimo Amel, MD  docusate sodium  (COLACE) 100 MG capsule Take 2 capsules (200 mg total)  by mouth 2 (two) times daily. Patient taking differently: Take 200 mg by mouth 2 (two) times daily. Patient uses PRN 08/17/22   Elgergawy, Dawood S, MD  ELIQUIS  2.5 MG TABS tablet TAKE 1 TABLET BY MOUTH TWICE  DAILY 04/28/23   [name], MD  Ferrous Sulfate  (IRON ) 325 (65 Fe) MG TABS Take 325 mg by mouth every evening.    [provider]  fluticasone  (FLONASE ) 50 MCG/ACT nasal spray USE 2 SPRAYS IN BOTH  NOSTRILS DAILY Patient taking differently: Place 2 sprays into both nostrils daily. 03/18/22   [name], MD  folic acid  (FOLVITE ) 1 MG tablet Take 1 tablet (1 mg total) by mouth daily. 02/26/20   Mikhail, Maryann, DO  furosemide  (LASIX ) 20 MG tablet TAKE 1 TABLET BY MOUTH DAILY AS  NEEDED FOR FLUID OR EDEMA Patient taking differently: Take 20 mg by mouth daily. 11/18/22   [name], MD  Multiple Vitamin (MULTIVITAMIN WITH MINERALS) TABS tablet Take 1 tablet by mouth daily. 02/26/20   Mikhail, Maryann, DO  mupirocin  cream (BACTROBAN ) 2 % Apply 1 Application topically 2 (two) times daily. 08/30/22   [name], MD  oxyCODONE  (OXY IR/ROXICODONE ) 5 MG immediate release tablet Take 5 mg by mouth every 4 (four) hours as needed for severe pain.    [provider]  OXYGEN Inhale 4-5 L into the lungs continuous.     [provider]  pantoprazole  (PROTONIX ) 20 MG tablet TAKE 1 TABLET BY MOUTH DAILY 01/01/23   [name], MD  Polyethyl Glycol-Propyl Glycol (SYSTANE) 0.4-0.3 % SOLN Place 1 drop into both eyes every other day.    [provider]  predniSONE  (DELTASONE ) 10 MG tablet TAKE 1 TABLET BY MOUTH DAILY  WITH BREAKFAST 02/21/23   Geronimo Amel, MD  predniSONE  (DELTASONE ) 10 MG tablet 4tabx2d,3tabx2d,2tabx2d then baseline 06/20/23   Geronimo Amel, MD  predniSONE  (DELTASONE ) 10 MG tablet 4 x 3 days, 3 x 3 days, 2 x 3 days, then 1 daily thereafter 07/07/23   Geronimo Amel, MD  predniSONE  (DELTASONE ) 20 MG tablet Take 1 tablet (20  mg total) by mouth daily with breakfast. Patient not taking: Reported on 05/26/2023 05/06/23   Parrett, Madelin RAMAN, NP  roflumilast  (DALIRESP ) 500 MCG TABS tablet TAKE 1 TABLET BY MOUTH DAILY 02/21/23   Geronimo Amel, MD  rosuvastatin  (CRESTOR ) 20 MG tablet Take 1 tablet (20 mg total) by mouth daily. Patient taking differently: Take 20 mg by mouth every evening. 09/11/22   [name], MD  SYMBICORT  160-4.5 MCG/ACT inhaler USE 2 INHALATIONS BY MOUTH TWICE DAILY 10/02/22   [name], MD  Tiotropium Bromide  Monohydrate (SPIRIVA  RESPIMAT) 2.5 MCG/ACT AERS USE 2 INHALATIONS BY MOUTH ONCE  DAILY 11/25/22   Geronimo Amel, MD  traMADol  (ULTRAM ) 50 MG tablet Take 50 mg by mouth every 12 (twelve) hours as needed for severe pain.    [provider]      Allergies    Tape, Augmentin  [amoxicillin -pot clavulanate], Cipro  [ciprofloxacin  hcl], Gadavist  [gadobutrol ], Gadolinium, Levaquin  [levofloxacin ], and Lipitor [atorvastatin ]    Review of Systems   Review of Systems  Respiratory:  Positive for shortness of breath.     Physical Exam Updated Vital Signs BP 130/70   Pulse (!) 58   Temp 98.7 F (37.1 C) (Oral)   Resp (!) 24   SpO2 99%  Physical Exam Vitals and nursing note reviewed.  Constitutional:      General: He is not in acute distress.    Appearance: He is not toxic-appearing.  HENT:     Head: Normocephalic.  Cardiovascular:     Rate and Rhythm: Regular rhythm. Tachycardia present.  Pulmonary:     Effort: Pulmonary effort is normal.     Comments: Coarse breath sounds throughout all lung fields, some mild wheezing. Abdominal:     Palpations: Abdomen is soft.  Musculoskeletal:     Cervical back: Normal range of motion.     Right lower leg: Edema present.     Left lower leg: Edema present.     Comments: Hyperemia to the bilateral lower extremities.  1+ DP pulses bilaterally  Skin:    General: Skin is warm and dry.     Capillary Refill: Capillary refill takes  less than 2 seconds.  Neurological:     Mental Status: He is alert and oriented to person, place, and time.  Psychiatric:        Mood and Affect: Mood normal.        Behavior: Behavior normal.     ED Results / Procedures / Treatments   Labs (all labs ordered are listed, but only abnormal results are displayed) Labs Reviewed  CBC - Abnormal; Notable for the following components:      Result Value   RBC 3.08 (*)    Hemoglobin 9.2 (*)    HCT 31.0 (*)    MCV 100.6 (*)    MCHC 29.7 (*)  All other components within normal limits  BASIC METABOLIC PANEL - Abnormal; Notable for the following components:   Chloride 89 (*)    CO2 42 (*)    Glucose, Bld 128 (*)    All other components within normal limits  LACTIC ACID, PLASMA - Abnormal; Notable for the following components:   Lactic Acid, Venous 2.2 (*)    All other components within normal limits  RESP PANEL BY RT-PCR (RSV, FLU A&B, COVID)  [id number]  CULTURE, BLOOD (ROUTINE X 2)  CULTURE, BLOOD (ROUTINE X 2)  LACTIC ACID, PLASMA    EKG EKG Interpretation Date/Time:  Wednesday July 09 2023 09:30:58 EST Ventricular Rate:  130 PR Interval:  157 QRS Duration:  75 QT Interval:  292 QTC Calculation: 430 R Axis:   -66  Text Interpretation: Sinus tachycardia Supraventricular bigeminy Left anterior fascicular block Abnormal R-wave progression, early transition Confirmed by Neysa Clap [phone]) on 07/09/2023 10:35:58 AM  Radiology DG Chest Portable 1 View Result Date: 07/09/2023 CLINICAL DATA:  COPD, shortness of breath, cough EXAM: PORTABLE CHEST - 1 VIEW COMPARISON:  02/20/2023 FINDINGS: Chronic biapical pleuroparenchymal scarring. Somewhat coarse pulmonary interstitial markings as before. Fiducial marker projects over the right hilum as before. No new infiltrate or overt edema. Heart size and mediastinal contours are within normal limits. Aortic Atherosclerosis (ICD10-170.0). No effusion. Visualized bones unremarkable. IMPRESSION:  Chronic changes without acute findings. Electronically Signed   By: JONETTA Faes M.D.   On: 07/09/2023 10:11    Procedures Procedures    Medications Ordered in ED Medications  azithromycin  (ZITHROMAX ) 500 mg in sodium chloride  0.9 % 250 mL IVPB (has no administration in time range)  sodium chloride  0.9 % bolus 500 mL (has no administration in time range)  ipratropium-albuterol  (DUONEB) 0.5-2.5 (3) MG/3ML nebulizer solution 3 mL (3 mLs Nebulization Given 07/09/23 1052)    ED Course/ Medical Decision Making/ A&P Clinical Course as of 07/09/23 1426  Wed Jul 09, 2023  0931 Patient on 4 L, home oxygen level.  Reports improvement of his breathing after taking home 40 mg prednisone , albuterol  rescue inhaler puffs x 4 and breathing treatment with EMS.  Does not appear in distress, poor air movement with some wheezing.  Tachycardia noted, patient reports heart rate typically around 115 resting at baseline. Chart review from pulmonology notes confirm resting tachycardia. He has been on antibiotics for the past 2 to 3 days.  He is unsure which ones.  Will get chest x-ray to evaluate for pneumonia, pneumothorax.  Will get EKG to evaluate his tachycardia.  DuoNeb ordered as well. [TY]  [id number] On further review; EMS noted fever. Afebrile here. Will get Sepsis screening labs [TY]  1037 Per pulm note on 12/30 : He might need admission given drops to the 70s and the typle of organisms in the lung. Or else all I can do from office    Is   Ciprofloxacin  750mg  bid x 12 days again\    Please take Take prednisone  40mg  once daily x 3 days, then 30mg  once daily x 3 days, then 20mg  once daily x 3 days, then prednisone  10mg  once daily  x 3 days and baselienie dose to continue  [TY]  1104 Lactic Acid, Venous: 1.9 [TY]  1147 Resp panel by RT-PCR (RSV, Flu A&B, Covid) Concern for possible viral etiology for patient's presentation.  However flu/COVID/RSV negative. [TY]  1252 Patient reevaluated still reports  improvement of his symptoms.  Maintaining oxygen saturation [TY]    Clinical Course User Index [  TY] Neysa Caron PARAS, DO                                 Medical Decision Making Is a 79 year old male presenting emergency department for shortness of breath in the setting of COPD.  Tachycardic, tachypneic, maintaining oxygen saturation on his home 4 L nasal cannula.  On exam does not appear to be in overt respiratory distress.  Reports that he feels much improved after EMSs DuoNeb.  Does have some poor air movement and wheezing.  Feels better after DuoNeb.  Chest x-ray without acute pneumonia.  He has no leukocytosis to suggest systemic infection.  Flu COVID RSV negative.  Basic metabolic panel reassuring.  Initial lactate not elevated.  Subsequent lactate slightly bumped.  Patient feeling improved and wanted to go home.  However with minimal exertion desaturated down to 88% on his home 4 L nasal cannula with tachypnea.  He seemingly improved with rest and simple facemask at same 4 L.  Given patient's age and comorbid disease will admit for further management of COPD exacerbation.  Azithromycin  ordered.  Amount and/or Complexity of Data Reviewed Labs: ordered. Decision-making details documented in ED Course. Radiology: ordered. ECG/medicine tests: ordered.  Risk Prescription drug management.          Final Clinical Impression(s) / ED Diagnoses Final diagnoses:  COPD exacerbation (HCC)  Skin tear of right elbow without complication, initial encounter    Rx / DC Orders ED Discharge Orders     None         Neysa Caron PARAS, DO 07/09/23 1426

## 2023-07-09 NOTE — H&P (Signed)
 History and Physical   Francisco [name] FMW:[id number] DOB: 1945/07/19 DOA: 07/09/2023  PCP: [name], MD   Patient coming from: Home  Chief Complaint: Shortness of breath  HPI: Francisco [name] is a 78 y.o. male with medical history significant of hypertension, hyperlipidemia, chronic CHF, BPH, PAD, anemia, GI AVM, compression fracture, essential tremor, chronic respiratory failure with hypoxia, severe COPD, pulmonary embolism, small cell lung cancer presenting with worsening shortness of breath and cough.  Patient has had a couple weeks of cough which worsened today and associated significant shortness of breath today.  Had been evaluated by pulmonology by telephone visit and been prescribed prednisone  taper and ciprofloxacin .  Which she has started.  Is chronically on 10 mg prednisone  daily.  Today with worsening systems he took his 40 p.o. prednisone  prescribed by pulmonologist and 4 puffs of albuterol  without improvement.  Received nebulizer and route with EMS with some improvement.  Had been desaturating as low as the 70s on home 4 L which she typically takes with exertion.  Denies fevers, chills, chest pain, abdominal pain, constipation, diarrhea, nausea, vomiting.  ED Course: Vital signs in the ED notable for heart rate in the 50s to 130s, respiratory rate in the teens to 20s, requiring his home 4 L at rest and with exertion and desaturating to 88% and having to rest.  Lab workup included BMP with chloride 89, bicarb stable 42.  CBC with hemoglobin stable at 9.2.  Lactic acid borderline at 1.9 and then 2.2 on repeat.  Respiratory panel for flu COVID RSV negative.  Blood cultures pending.  Chest x-ray with stable chronic changes and no acute abnormality.  Patient received DuoNeb, azithromycin , 500 cc IV fluid in the ED.  Review of Systems: As per HPI otherwise all other systems reviewed and are negative.  Past Medical History:  Diagnosis Date   Acute GI bleeding 02/18/2020    Melena with hemoglobin down to 10 February 2020.  EGD with intervention-follow-up with Dr. Wilhelmenia     Chronic back pain    mid and lower (04/07/2018)   Chronic rhinitis    -Sinus Ct 08/01/2009 >> Bilateral maxillary sinusitis with some mucosal thickeningin the sphenoid and frontal sinuses as well with air fluid levels present -chronic rhinitis flyer Aug 04, 2009   Compressed spine fracture [facility])    COPD (chronic obstructive pulmonary disease) (HCC)    PFT's rec Jul 17, 2009   Dyspnea    Emphysema lung (HCC)    Hyperglycemia 06/12/2016   Iron  deficiency anemia due to chronic blood loss 08/23/2020   Left ankle swelling 07/22/2014   HTN- controlled when fluid status contolled. on triamterene -hctz 37.5-25mg  sparingly  Unclear etiology.   LLE U/S 2016:     Leukocytosis 12/19/2016   Melena 08/11/2022   On home oxygen therapy    3L; 24/7 (04/07/2018)   Onychomycosis    Dr. Naomi   Orthostatic hypotension    since 10/2017 (04/07/2018)   PAD (peripheral artery disease) (HCC)    Pneumonia    twice in 1 year (04/07/2018)   Pulmonary embolism (HCC) 04/07/2018   Skin cancer    lips, face, ears, arms (04/07/2018)   Small cell lung cancer, right (HCC) 2023   Dr. Dewey.  rad   Vertigo    since ~ 02/2018 (04/07/2018)    Past Surgical History:  Procedure Laterality Date   ABDOMINAL AORTOGRAM W/LOWER EXTREMITY N/A 01/21/2020   Procedure: ABDOMINAL AORTOGRAM W/LOWER EXTREMITY;  Surgeon: Harvey Carlin BRAVO,  MD;  [facility]: [facility] INVASIVE CV LAB;  Service: Cardiovascular;  Laterality: N/A;   APPENDECTOMY     BIOPSY  02/20/2020   Procedure: BIOPSY;  Surgeon: Wilhelmenia Aloha Raddle., MD;  Location: [facility] ENDOSCOPY;  Service: Gastroenterology;;   BIOPSY  08/12/2022   Procedure: BIOPSY;  Surgeon: San Sandor GAILS, DO;  Location: [facility] ENDOSCOPY;  Service: Gastroenterology;;   BRONCHIAL BIOPSY  09/04/2021   Procedure: BRONCHIAL BIOPSIES;  Surgeon: Brenna Adine CROME, DO;  Location: [facility] ENDOSCOPY;  Service:  Pulmonary;;   BRONCHIAL BRUSHINGS  09/04/2021   Procedure: BRONCHIAL BRUSHINGS;  Surgeon: Brenna Adine CROME, DO;  Location: [facility] ENDOSCOPY;  Service: Pulmonary;;   BRONCHIAL NEEDLE ASPIRATION BIOPSY  09/04/2021   Procedure: BRONCHIAL NEEDLE ASPIRATION BIOPSIES;  Surgeon: Brenna Adine CROME, DO;  Location: [facility] ENDOSCOPY;  Service: Pulmonary;;   CATARACT EXTRACTION, BILATERAL     ENDARTERECTOMY FEMORAL Right 04/19/2019   Procedure: ENDARTERECTOMY RIGHT COMMON FEMORAL;  Surgeon: Harvey Carlin BRAVO, MD;  Location: [facility] OR;  Service: Vascular;  Laterality: Right;   ENDARTERECTOMY FEMORAL Left 01/24/2020   Procedure: LEFT FEMORAL ENDARTERECTOMY WITH DACRON PATCH ANGIOPLASTY;  Surgeon: Harvey Carlin BRAVO, MD;  Location: [facility] OR;  Service: Vascular;  Laterality: Left;   ESOPHAGOGASTRODUODENOSCOPY (EGD) WITH PROPOFOL  N/A 02/20/2020   Procedure: ESOPHAGOGASTRODUODENOSCOPY (EGD) WITH PROPOFOL ;  Surgeon: Wilhelmenia Aloha Raddle., MD;  Location: [facility] ENDOSCOPY;  Service: Gastroenterology;  Laterality: N/A;   ESOPHAGOGASTRODUODENOSCOPY (EGD) WITH PROPOFOL  N/A 08/12/2022   Procedure: ESOPHAGOGASTRODUODENOSCOPY (EGD) WITH PROPOFOL ;  Surgeon: San Sandor GAILS, DO;  [facility]OS[facility];  Service: Gastroenterology;  Laterality: N/A;   FEMORAL ENDARTERECTOMY Left 01/24/2020   FIDUCIAL MARKER PLACEMENT  09/04/2021   Procedure: FIDUCIAL MARKER PLACEMENT;  Surgeon: Brenna Adine CROME, DO;  Location: [facility] ENDOS[facility];  Service: Pulmonary;;   HEMOSTASIS CLIP PLACEMENT  02/20/2020   Procedure: HEMOSTASIS CLIP PLACEMENT;  Surgeon: Wilhelmenia Aloha Raddle., MD;  Location: [facility] ENDOSCOPY;  Service: Gastroenterology;;   HEMOSTASIS CLIP PLACEMENT  08/12/2022   Procedure: HEMOSTASIS CLIP PLACEMENT;  Surgeon: San Sandor GAILS, DO;  [facility] ENDOS[facility];  Service: Gastroenterology;;   HOT HEMOSTASIS N/A 02/20/2020   Procedure: HOT HEMOSTASIS (ARGON PLASMA COAGULATION/BICAP);  Surgeon: Wilhelmenia Aloha Raddle., MD;  Location: [facility] ENDOSCOPY;  Service:  Gastroenterology;  Laterality: N/A;   HOT HEMOSTASIS N/A 08/12/2022   Procedure: HOT HEMOSTASIS (ARGON PLASMA COAGULATION/BICAP);  Surgeon: San Sandor GAILS, DO;  Location: [facility] ENDOSCOPY;  Service: Gastroenterology;  Laterality: N/A;   INSERTION OF ILIAC STENT Left 01/24/2020   Procedure: INSERTION OF VBX STENT 8X59 AND 8X39 LEFT COMMON ILIAC ARTERY. INSERTION OF INNOVA 7 X 60 INNOVA STENT LEFT EXTERNAL ILIAC ARTERY. ;  Surgeon: Harvey Carlin BRAVO, MD;  Location: [facility] OR;  Service: Vascular;  Laterality: Left;   LOWER EXTREMITY ANGIOGRAPHY  12/11/2018   LOWER EXTREMITY ANGIOGRAPHY N/A 12/11/2018   Procedure: LOWER EXTREMITY ANGIOGRAPHY;  Surgeon: Harvey Carlin BRAVO, MD;  [facility];  Service: Cardiovascular;  Laterality: N/A;   PATCH ANGIOPLASTY Right 04/19/2019   Procedure: Patch Angioplasty;  Surgeon: Harvey Carlin BRAVO, MD;  Location: [facility] OR;  Service: Vascular;  Laterality: Right;   PERIPHERAL VASCULAR INTERVENTION Right 12/11/2018   Procedure: PERIPHERAL VASCULAR INTERVENTION;  Surgeon: Harvey Carlin BRAVO, MD;  [facility];  Service: Cardiovascular;  Laterality: Right;  Common Iliac    SKIN CANCER EXCISION     lips, face, ears, arms (04/07/2018)   SUBMUCOSAL INJECTION  08/12/2022   Procedure: SUBMUCOSAL INJECTION;  Surgeon: San Sandor GAILS, DO;  Location: [facility] ENDOSCOPY;  Service: Gastroenterology;;   TONSILLECTOMY     ULTRASOUND GUIDANCE FOR VASCULAR ACCESS Right 01/24/2020   Procedure: ULTRASOUND GUIDANCE FOR VASCULAR ACCESS;  Surgeon: Harvey Carlin BRAVO, MD;  Location: [facility] OR;  Service: Vascular;  Laterality: Right;   VIDEO BRONCHOSCOPY WITH RADIAL ENDOBRONCHIAL ULTRASOUND  09/04/2021   Procedure: VIDEO BRONCHOSCOPY WITH RADIAL ENDOBRONCHIAL ULTRASOUND;  Surgeon: Brenna Adine CROME, DO;  Location: [facility] ENDOSCOPY;  Service: Pulmonary;;    Social History  reports that he quit smoking about 5 years ago. His smoking use included pipe and cigarettes. He started smoking about  57 years ago. He has a 104 pack-year smoking history. He has never been exposed to tobacco smoke. He has never used smokeless tobacco. He reports current alcohol  use of about 11.0 standard drinks of alcohol  per week. He reports that he does not use drugs.  Allergies  Allergen Reactions   Tape Other (See Comments)    Skin tears easily.   Augmentin  [Amoxicillin -Pot Clavulanate] Other (See Comments)    Thrush Sore throat Hoarse voice   Cipro  [Ciprofloxacin  Hcl] Nausea And Vomiting    Weakness Fatigue    Gadavist  [Gadobutrol ] Other (See Comments)    Unknown reaction   Gadolinium Rash    Itchy back rash   Levaquin  [Levofloxacin ] Other (See Comments)    Hallucinations    Lipitor [Atorvastatin ] Rash    Family History  Problem Relation Age of Onset   Heart disease Mother        CABG in her 74s, nonsmoker   Cancer Mother    Stroke Father    Heart disease Father        Died of MI at age 73, smoker   Hepatitis Sister    Coronary artery disease Other        male 1st degree relative <60   Colon cancer Neg Hx    Pancreatic cancer Neg Hx    Esophageal cancer Neg Hx    Inflammatory bowel disease Neg Hx    Liver disease Neg Hx    Rectal cancer Neg Hx    Stomach cancer Neg Hx   Reviewed on admission  Prior to Admission medications   Medication Sig Start Date End Date Taking? Authorizing Provider  acetaminophen  (TYLENOL ) 500 MG tablet Take 500 mg by mouth every 4 (four) hours as needed for moderate pain, fever, headache or mild pain.    [provider]  albuterol  (PROVENTIL ) (2.5 MG/3ML) 0.083% nebulizer solution Take 3 mLs (2.5 mg total) by nebulization every 6 (six) hours as needed for wheezing or shortness of breath. 08/17/22   Elgergawy, Brayton RAMAN, MD  Calcium  Carbonate-Vitamin D  (CALCIUM  600+D PO) Take 2 tablets by mouth daily.    [provider]  ciprofloxacin  (CIPRO ) 750 MG tablet Take 1 tablet (750 mg total) by mouth 2 (two) times daily. 07/07/23   Geronimo Amel, MD  docusate sodium  (COLACE) 100 MG capsule Take 2 capsules (200 mg total) by mouth 2 (two) times daily. Patient taking differently: Take 200 mg by mouth 2 (two) times daily. Patient uses PRN 08/17/22   Elgergawy, Dawood S, MD  ELIQUIS  2.5 MG TABS tablet TAKE 1 TABLET BY MOUTH TWICE  DAILY 04/28/23   [name], MD  Ferrous Sulfate  (IRON ) 325 (65 Fe) MG TABS Take 325 mg by mouth every evening.    [provider]  fluticasone  (FLONASE ) 50 MCG/ACT nasal spray USE 2 SPRAYS IN BOTH  NOSTRILS DAILY Patient taking differently: Place 2 sprays into both nostrils daily.  03/18/22   [name], MD  folic acid  (FOLVITE ) 1 MG tablet Take 1 tablet (1 mg total) by mouth daily. 02/26/20   Mikhail, Maryann, DO  furosemide  (LASIX ) 20 MG tablet TAKE 1 TABLET BY MOUTH DAILY AS  NEEDED FOR FLUID OR EDEMA Patient taking differently: Take 20 mg by mouth daily. 11/18/22   [name], MD  Multiple Vitamin (MULTIVITAMIN WITH MINERALS) TABS tablet Take 1 tablet by mouth daily. 02/26/20   Mikhail, Maryann, DO  mupirocin  cream (BACTROBAN ) 2 % Apply 1 Application topically 2 (two) times daily. 08/30/22   [name], MD  oxyCODONE  (OXY IR/ROXICODONE ) 5 MG immediate release tablet Take 5 mg by mouth every 4 (four) hours as needed for severe pain.    [provider]  OXYGEN Inhale 4-5 L into the lungs continuous.     [provider]  pantoprazole  (PROTONIX ) 20 MG tablet TAKE 1 TABLET BY MOUTH DAILY 01/01/23   [name], MD  Polyethyl Glycol-Propyl Glycol (SYSTANE) 0.4-0.3 % SOLN Place 1 drop into both eyes every other day.    [provider]  predniSONE  (DELTASONE ) 10 MG tablet TAKE 1 TABLET BY MOUTH DAILY  WITH BREAKFAST 02/21/23   Geronimo Amel, MD  predniSONE  (DELTASONE ) 10 MG tablet 4 x 3 days, 3 x 3 days, 2 x 3 days, then 1 daily thereafter 07/07/23   Geronimo Amel, MD  roflumilast  (DALIRESP ) 500 MCG TABS tablet TAKE 1 TABLET BY MOUTH DAILY  02/21/23   Geronimo Amel, MD  rosuvastatin  (CRESTOR ) 20 MG tablet Take 1 tablet (20 mg total) by mouth daily. Patient taking differently: Take 20 mg by mouth every evening. 09/11/22   [name], MD  SYMBICORT  160-4.5 MCG/ACT inhaler USE 2 INHALATIONS BY MOUTH TWICE DAILY 10/02/22   [name], MD  Tiotropium Bromide  Monohydrate (SPIRIVA  RESPIMAT) 2.5 MCG/ACT AERS USE 2 INHALATIONS BY MOUTH ONCE  DAILY 11/25/22   Geronimo Amel, MD  traMADol  (ULTRAM ) 50 MG tablet Take 50 mg by mouth every 12 (twelve) hours as needed for severe pain.    [provider]    Physical Exam: Vitals:   07/09/23 1334 07/09/23 1337 07/09/23 1342 07/09/23 1409  BP:    130/70  Pulse: (!) 114 (!) 115 (!) 118 (!) 58  Resp: (!) 23 20 18  (!) 24  Temp:    98.7 F (37.1 C)  TempSrc:    Oral  SpO2: (!) 89% 91% 94% 99%    Physical Exam Constitutional:      General: He is not in acute distress.    Appearance: Normal appearance.  HENT:     Head: Normocephalic and atraumatic.     Mouth/Throat:     Mouth: Mucous membranes are moist.     Pharynx: Oropharynx is clear.  Eyes:     Extraocular Movements: Extraocular movements intact.     Pupils: Pupils are equal, round, and reactive to light.  Cardiovascular:     Rate and Rhythm: Normal rate and regular rhythm.     Pulses: Normal pulses.     Heart sounds: Normal heart sounds.  Pulmonary:     Effort: Pulmonary effort is normal. Tachypnea present. No respiratory distress.     Breath sounds: Decreased breath sounds, wheezing and rhonchi present.  Abdominal:     General: Bowel sounds are normal. There is no distension.     Palpations: Abdomen is soft.     Tenderness: There is no abdominal tenderness.  Musculoskeletal:  General: No swelling or deformity.  Skin:    General: Skin is warm and dry.  Neurological:     General: No focal deficit present.     Mental Status: Mental status is at baseline.    Labs on Admission: I have  personally reviewed following labs and imaging studies  CBC: Recent Labs  Lab 07/09/23 1001  WBC 10.4  HGB 9.2*  HCT 31.0*  MCV 100.6*  PLT 167    Basic Metabolic Panel: Recent Labs  Lab 07/09/23 1001  NA 141  K 4.2  CL 89*  CO2 42*  GLUCOSE 128*  BUN 15  CREATININE 0.99  CALCIUM  9.4    GFR: CrCl cannot be calculated (Unknown ideal weight.).  Liver Function Tests: No results for input(s): AST, ALT, ALKPHOS, BILITOT, PROT, ALBUMIN  in the last 168 hours.  Urine analysis:    Component Value Date/Time   COLORURINE YELLOW 02/21/2023 0710   APPEARANCEUR CLEAR 02/21/2023 0710   LABSPEC 1.012 02/21/2023 0710   PHURINE 7.0 02/21/2023 0710   GLUCOSEU NEGATIVE 02/21/2023 0710   GLUCOSEU NEGATIVE 06/25/2017 0831   HGBUR NEGATIVE 02/21/2023 0710   HGBUR trace-lysed 02/12/2010 0819   BILIRUBINUR NEGATIVE 02/21/2023 0710   BILIRUBINUR Negative 12/25/2017 0852   KETONESUR 5 (A) 02/21/2023 0710   PROTEINUR NEGATIVE 02/21/2023 0710   UROBILINOGEN 0.2 12/25/2017 0852   UROBILINOGEN 0.2 06/25/2017 0831   NITRITE NEGATIVE 02/21/2023 0710   LEUKOCYTESUR NEGATIVE 02/21/2023 0710    Radiological Exams on Admission: DG Chest Portable 1 View Result Date: 07/09/2023 CLINICAL DATA:  COPD, shortness of breath, cough EXAM: PORTABLE CHEST - 1 VIEW COMPARISON:  02/20/2023 FINDINGS: Chronic biapical pleuroparenchymal scarring. Somewhat coarse pulmonary interstitial markings as before. Fiducial marker projects over the right hilum as before. No new infiltrate or overt edema. Heart size and mediastinal contours are within normal limits. Aortic Atherosclerosis (ICD10-170.0). No effusion. Visualized bones unremarkable. IMPRESSION: Chronic changes without acute findings. Electronically Signed   By: JONETTA Faes M.D.   On: 07/09/2023 10:11   EKG: Independently reviewed.  Sinus tachycardia at 130 bpm.  Low voltage multiple leads.  Minimal baseline wander and minimal baseline artifact.   Possible PAC.  Assessment/Plan Active Problems:   Hyperlipidemia   BPH associated with nocturia   Essential hypertension   Chronic pulmonary embolism (HCC)   COPD with acute exacerbation (HCC)   PAD (peripheral artery disease) (HCC)   Malignant neoplasm of lower lobe of right lung (HCC)   H/O arteriovenous malformation (AVM)   Anemia   Chronic systolic congestive heart failure (HCC)   Acute on chronic respiratory failure with hypoxia (HCC)   Acute on chronic respite failure hypoxia COPD exacerbation > Known history of severe COPD following with pulmonology.  > Said a couple weeks of symptoms and started on prednisone , Cipro  by pulmonologist over a phone visit.  With desaturations to 70s, he was told at that time he may need admission. > Persistent cough and now worsening shortness of breath this morning.  Tried his prescribed prednisone  and albuterol  without improvement.  Called EMS and received nebulizer with improvement. > In the ED felt somewhat improved but continued to desaturate on home oxygen with any exertion. > Negative for flu COVID and RSV in the ED.  No leukocytosis.  Suspect alternative viral etiology, will check RVP. > Received azithromycin  and DuoNeb in the ED. - Monitor in progressive given patient's baseline oxygen requirement - Continue with azithromycin  - Solu-Medrol , followed by daily prednisone , long taper with baseline prednisone  of  10 mg - Scheduled Atrovent  - As needed albuterol  - Continue home Spiriva , replace home Symbicort  with formulary Dulera  - Continue home Roflumilast  - Check full respiratory viral panel for possible etiology considering lack of leukocytosis. - Pulmonary toilet - Supportive care - If fails to improve consider CT   Hypertension - Continue home Lasix   Hyperlipidemia - Continue home rosuvastatin   Chronic CHF > Last echo was in August 2024 with EF 55 to 60%, indeterminate diastolic function, normal RV function. - Continue home  Lasix   PAD - Continue home Eliquis   History of pulmonary embolism - Continue home Eliquis   Anemia > Hemoglobin stable at 9.2 - Trend CBC   History of GI bleed - History of AVM - History of gastritis - Continue home PPI  History of small cell lung cancer > Status post radiation, on monitoring, most recently stable per chart review. - Noted  DVT prophylaxis: Eliquis  Code Status:   DNR/DNI  Family Communication:  Updated at bedside  Disposition Plan:   Patient is from:  Home  Anticipated DC to:  Home  Anticipated DC date:  1 to 3 days  Anticipated DC barriers: None  Consults called:  None Admission status:  Observation, progressive  Severity of Illness: The appropriate patient status for this patient is OBSERVATION. Observation status is judged to be reasonable and necessary in order to provide the required intensity of service to ensure the patient's safety. The patient's presenting symptoms, physical exam findings, and initial radiographic and laboratory data in the context of their medical condition is felt to place them at decreased risk for further clinical deterioration. Furthermore, it is anticipated that the patient will be medically stable for discharge from the hospital within 2 midnights of admission.    Marsa KATHEE Scurry MD [facility]  How to contact the [facility] Attending or Consulting provider 7A - 7P or covering provider during after hours 7P -7A, for this patient?   Check the care team in [facility] and look for a) attending/consulting [facility] provider listed and b) the [facility] team listed Log into [email or web address] and use [place]'s universal password to access. If you do not have the password, please contact the hospital operator. Locate the [facility] provider you are looking for under [facility] and page to a number that you can be directly reached. If you still have difficulty reaching the provider, please page the [facility] (Director on Call) for the Hospitalists listed  on amion for assistance.  07/09/2023, 3:07 PM

## 2023-07-09 NOTE — ED Notes (Signed)
 Wound care provided to Rt upper arm skin tear that pt reports happened when he was being assisted by EMS when he almost fell while at his house.

## 2023-07-09 NOTE — Discharge Instructions (Signed)
 Please follow-up with your pulmonologist.  Return immediately for any fevers, chills, chest pain, worsening shortness of breath, nausea, vomiting or any new or worsening symptoms that are concerning to you.

## 2023-07-10 DIAGNOSIS — Z2239 Carrier of other specified bacterial diseases: Secondary | ICD-10-CM

## 2023-07-10 DIAGNOSIS — J441 Chronic obstructive pulmonary disease with (acute) exacerbation: Secondary | ICD-10-CM | POA: Diagnosis not present

## 2023-07-10 DIAGNOSIS — J9621 Acute and chronic respiratory failure with hypoxia: Secondary | ICD-10-CM | POA: Diagnosis not present

## 2023-07-10 DIAGNOSIS — S51012A Laceration without foreign body of left elbow, initial encounter: Secondary | ICD-10-CM | POA: Insufficient documentation

## 2023-07-10 DIAGNOSIS — Z66 Do not resuscitate: Secondary | ICD-10-CM | POA: Diagnosis not present

## 2023-07-10 DIAGNOSIS — J9622 Acute and chronic respiratory failure with hypercapnia: Secondary | ICD-10-CM | POA: Diagnosis not present

## 2023-07-10 LAB — CBC
HCT: 28.8 % — ABNORMAL LOW (ref 39.0–52.0)
Hemoglobin: 8.5 g/dL — ABNORMAL LOW (ref 13.0–17.0)
MCH: 29.5 pg (ref 26.0–34.0)
MCHC: 29.5 g/dL — ABNORMAL LOW (ref 30.0–36.0)
MCV: 100 fL (ref 80.0–100.0)
Platelets: 141 10*3/uL — ABNORMAL LOW (ref 150–400)
RBC: 2.88 MIL/uL — ABNORMAL LOW (ref 4.22–5.81)
RDW: 12.7 % (ref 11.5–15.5)
WBC: 3.6 10*3/uL — ABNORMAL LOW (ref 4.0–10.5)
nRBC: 0 % (ref 0.0–0.2)

## 2023-07-10 LAB — BLOOD GAS, VENOUS
Acid-Base Excess: 17.2 mmol/L — ABNORMAL HIGH (ref 0.0–2.0)
Bicarbonate: 49 mmol/L — ABNORMAL HIGH (ref 20.0–28.0)
Drawn by: 59174
O2 Saturation: 22.9 %
Patient temperature: 36.4
pCO2, Ven: 99 mm[Hg] (ref 44–60)
pH, Ven: 7.3 (ref 7.25–7.43)
pO2, Ven: <31 mm[Hg] — CL (ref 32–45)

## 2023-07-10 LAB — MRSA NEXT GEN BY PCR, NASAL: MRSA by PCR Next Gen: NOT DETECTED

## 2023-07-10 LAB — RESPIRATORY PANEL BY PCR

## 2023-07-10 LAB — COMPREHENSIVE METABOLIC PANEL
ALT: 16 U/L (ref 0–44)
AST: 22 U/L (ref 15–41)
Albumin: 3.1 g/dL — ABNORMAL LOW (ref 3.5–5.0)
Alkaline Phosphatase: 43 U/L (ref 38–126)
Anion gap: 7 (ref 5–15)
BUN: 17 mg/dL (ref 8–23)
CO2: 43 mmol/L — ABNORMAL HIGH (ref 22–32)
Calcium: 9 mg/dL (ref 8.9–10.3)
Chloride: 90 mmol/L — ABNORMAL LOW (ref 98–111)
Creatinine, Ser: 0.99 mg/dL (ref 0.61–1.24)
GFR, Estimated: >60 mL/min (ref >60)
Glucose, Bld: 117 mg/dL — ABNORMAL HIGH (ref 70–99)
Potassium: 5.1 mmol/L (ref 3.5–5.1)
Sodium: 140 mmol/L (ref 135–145)
Total Bilirubin: 0.5 mg/dL (ref 0.0–1.2)
Total Protein: 5.8 g/dL — ABNORMAL LOW (ref 6.5–8.1)

## 2023-07-10 MED ORDER — CHLORHEXIDINE GLUCONATE CLOTH 2 % EX PADS
6.0000 | MEDICATED_PAD | Freq: Every day | CUTANEOUS | Status: DC
  Administered 2023-07-13 – 2023-07-14 (×2): 6 via TOPICAL

## 2023-07-10 MED ORDER — AZITHROMYCIN 500 MG PO TABS: 500.0000 mg | ORAL_TABLET | ORAL | Status: DC

## 2023-07-10 MED ORDER — DEXTROSE-SODIUM CHLORIDE 5-0.45 % IV SOLN: INTRAVENOUS | Status: DC

## 2023-07-10 MED ORDER — DEXTROSE 5 % IV SOLN
250.0000 mg | INTRAVENOUS | Status: DC
  Administered 2023-07-11: 250 mg via INTRAVENOUS
  Filled 2023-07-10 (×3): qty 2.5

## 2023-07-10 MED ORDER — FENTANYL CITRATE PF 50 MCG/ML IJ SOSY
12.5000 ug | PREFILLED_SYRINGE | INTRAMUSCULAR | Status: DC | PRN
  Administered 2023-07-10 – 2023-07-11 (×3): 50 ug via INTRAVENOUS
  Filled 2023-07-10 (×3): qty 1

## 2023-07-10 MED ORDER — VANCOMYCIN HCL 1.5 G IV SOLR
1500.0000 mg | INTRAVENOUS | Status: DC
  Administered 2023-07-11: 1500 mg via INTRAVENOUS
  Filled 2023-07-10 (×3): qty 30

## 2023-07-10 MED ORDER — MIDAZOLAM HCL 2 MG/2ML IJ SOLN: 1.0000 mg | INTRAMUSCULAR | Status: DC | PRN

## 2023-07-10 MED ORDER — CIPROFLOXACIN HCL 750 MG PO TABS
750.0000 mg | ORAL_TABLET | Freq: Two times a day (BID) | ORAL | Status: DC
  Administered 2023-07-10: 750 mg via ORAL
  Filled 2023-07-10 (×2): qty 1

## 2023-07-10 MED ORDER — TRAMADOL HCL 50 MG PO TABS
50.0000 mg | ORAL_TABLET | Freq: Two times a day (BID) | ORAL | Status: DC | PRN
  Administered 2023-07-14: 50 mg via ORAL
  Filled 2023-07-10: qty 1

## 2023-07-10 MED ORDER — MAGNESIUM SULFATE 2 GM/50ML IV SOLN
2.0000 g | Freq: Once | INTRAVENOUS | Status: AC
  Administered 2023-07-10: 2 g via INTRAVENOUS
  Filled 2023-07-10: qty 50

## 2023-07-10 MED ORDER — SODIUM CHLORIDE 0.9 % IV SOLN
1.0000 g | Freq: Three times a day (TID) | INTRAVENOUS | Status: DC
Start: 2023-07-10 — End: 2023-07-12
  Administered 2023-07-10 – 2023-07-12 (×6): 1 g via INTRAVENOUS
  Filled 2023-07-10 (×9): qty 1

## 2023-07-10 MED ORDER — IPRATROPIUM-ALBUTEROL 0.5-2.5 (3) MG/3ML IN SOLN
3.0000 mL | RESPIRATORY_TRACT | Status: DC
  Administered 2023-07-10 – 2023-07-12 (×11): 3 mL via RESPIRATORY_TRACT
  Filled 2023-07-10 (×11): qty 3

## 2023-07-10 MED ORDER — IPRATROPIUM-ALBUTEROL 0.5-2.5 (3) MG/3ML IN SOLN
3.0000 mL | Freq: Four times a day (QID) | RESPIRATORY_TRACT | Status: DC
  Administered 2023-07-10 (×2): 3 mL via RESPIRATORY_TRACT
  Filled 2023-07-10 (×2): qty 3

## 2023-07-10 MED ORDER — SODIUM CHLORIDE 3 % IN NEBU
4.0000 mL | INHALATION_SOLUTION | Freq: Two times a day (BID) | RESPIRATORY_TRACT | Status: AC
  Administered 2023-07-10 – 2023-07-12 (×4): 4 mL via RESPIRATORY_TRACT
  Filled 2023-07-10 (×8): qty 4

## 2023-07-10 MED ORDER — MUPIROCIN 2 % EX OINT
1.0000 | TOPICAL_OINTMENT | Freq: Two times a day (BID) | CUTANEOUS | Status: DC
  Administered 2023-07-11 – 2023-07-14 (×7): 1 via NASAL
  Filled 2023-07-10 (×3): qty 22

## 2023-07-10 NOTE — Assessment & Plan Note (Addendum)
 07/10/2023 PCCM discussed with pt, wife and dtr. Pt was made DNR/DNI.

## 2023-07-10 NOTE — Assessment & Plan Note (Addendum)
 07/10/2023 stable. 07-11-2023 stable 07-12-2023 HgB 7.9 today.  Was 9.2 3 days ago. Had skin tear on left arm on admission. Wound care consult. 07-13-2023 see acute blood loss anemia. Getting 2u PRBC transfusion today. 07-14-2023 post-transfusion HgB 10.3 g/dl

## 2023-07-10 NOTE — Assessment & Plan Note (Addendum)
 07/10/2023 chronic 07-11-2023 thru 07-14-2023 stable

## 2023-07-10 NOTE — Assessment & Plan Note (Addendum)
 07/10/2023 now on IV Ceftaz and vanco per PCCM. 07/11/2023 continue IV ceftax and vanco per [facility] 07/12/2023 defer abx management to PCCM. WBC 4.3, no fevers. Checking CT chest. 07-13-2023 discussed with PCCM. Have stopped all IV abx. Only on MWF zithromax  to help with his sputum production. 07-14-2023 stable.

## 2023-07-10 NOTE — Progress Notes (Signed)
 Pt's wife called me to bedside. Stating pt is talking about death and wants to die.  Pt keeps taking off his O2 mask off and states he is ready to go  Reached out to Dr. Laurence to see if we could give pt medication for anxiety but he deferred this to PCCM.   Dr. Shelah is attending to an emergency in 4N ICU. Paged Rahul PA for assistance at [phone]

## 2023-07-10 NOTE — Assessment & Plan Note (Addendum)
 07-10-2023 VBG obtained this AM. pH 7.3, PCO2 99, bicarb 49. Has both acute and chronic respiratory acidosis with metabolic alkalosis.  PCCM consulted. May need NIV at home. Discussed at length with pt's wife and dtr. Placed on NIV per PCCM. Keep NPO for now.

## 2023-07-10 NOTE — Progress Notes (Signed)
 PROGRESS NOTE    GAVINN COLLARD [name]  FMW:[id number] DOB: 1946/07/02 DOA: 07/09/2023 PCP: [name], MD  Subjective: Pt seen and examined. Met with pt, wife and dtr at bedside. Pt on NIV.   Hospital Course: HPI: Francisco [name] is a 78 y.o. male with medical history significant of hypertension, hyperlipidemia, chronic CHF, BPH, PAD, anemia, GI AVM, compression fracture, essential tremor, chronic respiratory failure with hypoxia, severe COPD, pulmonary embolism, small cell lung cancer presenting with worsening shortness of breath and cough.   Patient has had a couple weeks of cough which worsened today and associated significant shortness of breath today.  Had been evaluated by pulmonology by telephone visit and been prescribed prednisone  taper and ciprofloxacin .  Which she has started.  Is chronically on 10 mg prednisone  daily.   Today with worsening systems he took his 40 p.o. prednisone  prescribed by pulmonologist and 4 puffs of albuterol  without improvement.  Received nebulizer and route with EMS with some improvement.  Had been desaturating as low as the 70s on home 4 L which she typically takes with exertion.   Denies fevers, chills, chest pain, abdominal pain, constipation, diarrhea, nausea, vomiting.  Significant Events: Admitted 07/09/2023 for COPD exacerbation, acute on chronic respiratory failure with hypoxia and hypercapnia   Significant Labs: chloride 89, bicarb stable 42. CBC with hemoglobin stable at 9.2. Lactic acid borderline at 1.9 and then 2.2 on repeat. Respiratory panel for flu COVID RSV negative.   Significant Imaging Studies: Chest x-ray with stable chronic changes and no acute abnormality.   Antibiotic Therapy: Anti-infectives (From admission, onward)    Start     Dose/Rate Route Frequency Ordered Stop   07/10/23 1000  azithromycin  (ZITHROMAX ) tablet 500 mg        500 mg Oral Daily 07/09/23 1503     07/09/23 1430  azithromycin  (ZITHROMAX ) 500 mg in  sodium chloride  0.9 % 250 mL IVPB        500 mg 250 mL/hr over 60 Minutes Intravenous  Once 07/09/23 1423 07/09/23 1531       Procedures:   Consultants:     Assessment and Plan: * Acute on chronic respiratory failure with hypoxia (HCC) 07-10-2023 VBG obtained this AM. pH 7.3, PCO2 99, bicarb 49. Has both acute and chronic respiratory acidosis with metabolic alkalosis.  PCCM consulted. May need NIV at home. Discussed at length with pt's wife and dtr. Placed on NIV per PCCM. Keep NPO for now.  COPD with acute exacerbation (HCC) 07-10-2023 defer management to PCCM. He was placed on IV steroids, IV ceftaz and IV vanco. On NIV  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 now on IV Ceftaz and vanco per PCCM.  Chronic systolic congestive heart failure (HCC) 07/10/2023 stable. Holding lasix  for now as this could contribute to alkalosis.  Anemia 07/10/2023 stable.  H/O arteriovenous malformation (AVM) 07/10/2023 stable. No evidence of bleeding.  Malignant neoplasm of lower lobe of right lung (HCC) 07/10/2023 stable.  PAD (peripheral artery disease) (HCC) 07/10/2023 remains on crestor  and eliquis .  Chronic pulmonary embolism (HCC) 07/10/2023 continue with Eliquis  2.5 mg bid.  Essential hypertension 07/10/2023 stable  BPH associated with nocturia 07/10/2023 chronic  Hyperlipidemia 07/10/2023 continue crestor  20 mg qpm.  DNR (do not resuscitate) 07/10/2023 PCCM discussed with pt, wife and dtr. Pt was made DNR/DNI.  Chronic respiratory failure with hypoxia and hypercapnia (HCC) 07/10/2023 evidence of chronic CO2 retention with VBG showing pH of 7.3 and PCO2 99.  Pt may need NIV at  home/discharge.   DVT prophylaxis: apixaban  (ELIQUIS ) tablet 2.5 mg Start: 07/09/23 2200 apixaban  (ELIQUIS ) tablet 2.5 mg     Code Status: Limited: Do not attempt resuscitation (DNR) -DNR-LIMITED -Do Not Intubate/DNI  Family Communication: discussed at bedside with wife and  dtr. Disposition Plan: return home Reason for continuing need for hospitalization: now on NIV, IV ABX.  Objective: Vitals:   07/10/23 0903 07/10/23 1219 07/10/23 1503 07/10/23 1535  BP:  116/82    Pulse: (!) 106 (!) 116    Resp: (!) 21 (!) 21 20   Temp:  (!) 97.5 F (36.4 C)  (!) 97.1 F (36.2 C)  TempSrc:  Axillary  Axillary  SpO2: (!) 85% (!) 86%      Intake/Output Summary (Last 24 hours) at 07/10/2023 1557 Last data filed at 07/10/2023 1219 Gross per 24 hour  Intake 243 ml  Output 200 ml  Net 43 ml   There were no vitals filed for this visit.  Examination:  Physical Exam Vitals and nursing note reviewed.  Constitutional:      Comments: Chronically ill appearing  HENT:     Head: Normocephalic and atraumatic.  Eyes:     General: No scleral icterus. Cardiovascular:     Rate and Rhythm: Regular rhythm. Tachycardia present.  Pulmonary:     Effort: Respiratory distress present.     Comments: On NIV. Diminished BS bilaterally Abdominal:     General: Bowel sounds are normal.  Skin:    Capillary Refill: Capillary refill takes less than 2 seconds.     Comments: Skin tear on left elbow that is oozing venous blood.  Neurological:     Mental Status: He is alert and oriented to person, place, and time.     Comments: Left hemiplegia     Data Reviewed: I have personally reviewed following labs and imaging studies  CBC: Recent Labs  Lab 07/09/23 1001 07/10/23 0643  WBC 10.4 3.6*  HGB 9.2* 8.5*  HCT 31.0* 28.8*  MCV 100.6* 100.0  PLT 167 141*   Basic Metabolic Panel: Recent Labs  Lab 07/09/23 1001 07/10/23 0643  NA 141 140  K 4.2 5.1  CL 89* 90*  CO2 42* 43*  GLUCOSE 128* 117*  BUN 15 17  CREATININE 0.99 0.99  CALCIUM  9.4 9.0   GFR: CrCl cannot be calculated (Unknown ideal weight.). Liver Function Tests: Recent Labs  Lab 07/10/23 0643  AST 22  ALT 16  ALKPHOS 43  BILITOT 0.5  PROT 5.8*  ALBUMIN  3.1*   BNP (last 3 results) Recent Labs     08/13/22 0522 08/14/22 0617 02/20/23 1617  BNP 144.5* 143.2* 86.0   Sepsis Labs: Recent Labs  Lab 07/09/23 1001 07/09/23 1249  LATICACIDVEN 1.9 2.2*    Recent Results (from the past 240 hours)  Resp panel by RT-PCR (RSV, Flu A&B, Covid)     Status: None   Collection Time: 07/09/23 10:01 AM   Specimen: Nasal Swab  Result Value Ref Range Status   SARS Coronavirus 2 by RT PCR NEGATIVE NEGATIVE Final   Influenza A by PCR NEGATIVE NEGATIVE Final   Influenza B by PCR NEGATIVE NEGATIVE Final    Comment: (NOTE) The Xpert Xpress SARS-CoV-2/FLU/RSV plus assay is intended as an aid in the diagnosis of influenza from Nasopharyngeal swab specimens and should not be used as a sole basis for treatment. Nasal washings and aspirates are unacceptable for Xpert Xpress SARS-CoV-2/FLU/RSV testing.  Fact Sheet for Patients: bloggercourse.com  Fact Sheet for Healthcare Providers:  seriousbroker.it  This test is not yet approved or cleared by the [place]  FDA and has been authorized for detection and/or diagnosis of SARS-CoV-2 by FDA under an Emergency Use Authorization (EUA). This EUA will remain in effect (meaning this test can be used) for the duration of the COVID-19 declaration under Section 564(b)(1) of the Act, 21 U.S.C. section 360bbb-3(b)(1), unless the authorization is terminated or revoked.     Resp Syncytial Virus by PCR NEGATIVE NEGATIVE Final    Comment: (NOTE) Fact Sheet for Patients: bloggercourse.com  Fact Sheet for Healthcare Providers: seriousbroker.it  This test is not yet approved or cleared by the [place]  FDA and has been authorized for detection and/or diagnosis of SARS-CoV-2 by FDA under an Emergency Use Authorization (EUA). This EUA will remain in effect (meaning this test can be used) for the duration of the COVID-19 declaration under Section 564(b)(1)  of the Act, 21 U.S.C. section 360bbb-3(b)(1), unless the authorization is terminated or revoked.  Performed at [facility], [address] [place], [place] [address]   Culture, blood (routine x 2)     Status: None (Preliminary result)   Collection Time: 07/09/23 10:03 AM   Specimen: BLOOD RIGHT WRIST  Result Value Ref Range Status   Specimen Description BLOOD RIGHT WRIST  Final   Special Requests   Final    BOTTLES DRAWN AEROBIC AND ANAEROBIC Blood Culture results may not be optimal due to an inadequate volume of blood received in culture bottles   Culture   Final    NO GROWTH < 24 HOURS Performed at [facility], [address], [place], [place] [address]    Report Status PENDING  Incomplete  Culture, blood (routine x 2)     Status: None (Preliminary result)   Collection Time: 07/09/23 10:25 AM   Specimen: BLOOD RIGHT HAND  Result Value Ref Range Status   Specimen Description BLOOD RIGHT HAND  Final   Special Requests   Final    BOTTLES DRAWN AEROBIC ONLY Blood Culture results may not be optimal due to an inadequate volume of blood received in culture bottles   Culture   Final    NO GROWTH < 24 HOURS Performed at [facility], [address], [place], [place] [address]    Report Status PENDING  Incomplete     Radiology Studies: DG Chest Portable 1 View Result Date: 07/09/2023 CLINICAL DATA:  COPD, shortness of breath, cough EXAM: PORTABLE CHEST - 1 VIEW COMPARISON:  02/20/2023 FINDINGS: Chronic biapical pleuroparenchymal scarring. Somewhat coarse pulmonary interstitial markings as before. Fiducial marker projects over the right hilum as before. No new infiltrate or overt edema. Heart size and mediastinal contours are within normal limits. Aortic Atherosclerosis (ICD10-170.0). No effusion. Visualized bones unremarkable. IMPRESSION: Chronic changes without acute findings. Electronically Signed   By: JONETTA Faes M.D.   On: 07/09/2023 10:11    Scheduled Meds:   apixaban   2.5 mg Oral BID   [START ON 07/11/2023] Chlorhexidine  Gluconate Cloth  6 each Topical Q0600   ipratropium-albuterol   3 mL Nebulization Q4H   mupirocin  ointment  1 Application Nasal BID   pantoprazole   20 mg Oral Daily   polyvinyl alcohol   1 drop Both Eyes QODAY   roflumilast   500 mcg Oral Daily   rosuvastatin   20 mg Oral QPM   sodium chloride  flush  3 mL Intravenous Q12H   sodium chloride  HYPERTONIC  4 mL Nebulization BID   Continuous Infusions:  azithromycin   cefTAZidime  (FORTAZ )  IV     dextrose  5 % and 0.45 % NaCl 40 mL/hr at 07/10/23 1500   magnesium  sulfate bolus IVPB     vancomycin        LOS: 0 days   Time spent: 40 minutes  Camellia Door, DO  [facility]  07/10/2023, 3:57 PM

## 2023-07-10 NOTE — Assessment & Plan Note (Addendum)
 07/10/2023 continue with Eliquis 2.5 mg bid. 07-11-2023 thru 07-14-2023 stable

## 2023-07-10 NOTE — Assessment & Plan Note (Addendum)
 07/10/2023 stable. Holding lasix  for now as this could contribute to alkalosis. 07-11-2023 stable. Repeat BMP in AM. 07/12/2023 his metabolic alkalosis is improving with NIV treatment. 07-13-2023 continue to hold scheduled lasix . 07-14-2023 don't think he needs scheduled lasix  at discharge.

## 2023-07-10 NOTE — Assessment & Plan Note (Addendum)
 07/10/2023 stable 07-11-2023 thru 07-14-2023 stable

## 2023-07-10 NOTE — Subjective & Objective (Addendum)
 Pt seen and examined.  pt's wife at bedside Pt transfused 2 units PRBC yesterday. HgB up to 10.3 g/dl today. Pt feeling better.  Awaiting approval of home NIV machine.

## 2023-07-10 NOTE — Assessment & Plan Note (Addendum)
 07-10-2023 defer management to PCCM. He was placed on IV steroids, IV ceftaz and IV vanco. On NIV. PCCM wrote to change to IV steroids. Pt received 125 mg IV solumedrol yesterday AM. Will continue with 80 mg q12h IV solumedrol. Continue with NIV. Will ask CM to look into home NIV for him. 07/12/2023 no wheezing. Change to po prednisone . PCCM to manage IV Abx. Would advocate in stopping abx. 07-13-2023 discussed with PCCM yesterday. Have stopped all IV Abx. Change back to MWF zithromax . Now on po prednisone .  07-14-2023 doing well with po prednisone  40 mg daily. On duonebs tid. *DC to home with 12 days prednisone  taper starting at 40 mg daily.

## 2023-07-10 NOTE — Progress Notes (Signed)
 Date and time results received: 07/10/23 11:14 AM  Test: VBG Critical Value: PCO2 99, PO2 less then 31, pH 7.3  Name of Provider Notified: Dr. Carollee Herter  Orders Received? Or Actions Taken?:  Dr. Imogene Burn consulting CCM. Waiting for further instructions

## 2023-07-10 NOTE — Progress Notes (Signed)
 Getting ready to hang pt's IV Mag and IV Antibiotics but pt expressed his feelings to me and family he wishes to stop all treatment.   Pt A&O x4  Family tried to convince pt but pt re-affirmed his wishes.   Family is wanting to respect his wishes and wanted me to notify Dr. Geronimo. They also wish to keep pt comfortable and pain free with medication during the process  Notified MD.   Onnie pt's BiPap off and Pushed 25 mg of IV Fent per MD.   MD will make it as soon as he can. Updated pt and family. Family waiting for son to arrive.

## 2023-07-10 NOTE — Progress Notes (Signed)
 Pharmacy Antibiotic Note  Francisco [name] is a 78 y.o. male admitted on 07/09/2023 with pneumonia.  Pharmacy has been consulted for ceftazidime  and vancomycin  dosing.Patient follows with pulmonary outpatient fro severe COPD with frequent exacerbations. On chronic pred 10 PTA. Patient recently completed cipro  course with no improvement in respiratory symptoms. Patient grew Achromobacter 05/2023 that was S to ceftazidime . Estimated CrCl 71mL/min.   Plan: Ceftazidime  1g q8h.  Vancomycin  1500mg  q24h for eAUC440.  F/u MRSA PCR.     Temp (24hrs), Avg:97.9 F (36.6 C), Min:97.5 F (36.4 C), Max:98.5 F (36.9 C)  Recent Labs  Lab 07/09/23 1001 07/09/23 1249 07/10/23 0643  WBC 10.4  --  3.6*  CREATININE 0.99  --  0.99  LATICACIDVEN 1.9 2.2*  --     CrCl cannot be calculated (Unknown ideal weight.).    Allergies  Allergen Reactions   Tape Other (See Comments)    Skin tears easily   Augmentin  [Amoxicillin -Pot Clavulanate] Other (See Comments)    Thrush Sore throat Hoarse voice   Gadavist  [Gadobutrol ] Other (See Comments)    Unknown reaction   Gadolinium Itching, Rash and Other (See Comments)    Itchy back rash   Levaquin  [Levofloxacin ] Other (See Comments)    Hallucinations    Lipitor [Atorvastatin ] Rash    Thank you for allowing pharmacy to be a part of this patient's care.  Powell Blush, PharmD, BCCCP  07/10/2023 2:54 PM

## 2023-07-10 NOTE — Assessment & Plan Note (Addendum)
 07/10/2023 evidence of chronic CO2 retention with VBG showing pH of 7.3 and PCO2 99.  Pt may need NIV at home/discharge. 07/11/2023 baseline PCO2 in the low 70s. This AM pH 7.42. CM looking into home NIV. 07/12/2023 ABG on NIV this AM. pH 7.4, PCO2 of 68. TOC working on home NIV. 07-13-2023 pt needs ABG on NIV to document his need for home NIV machine. Getting this done today.   07-14-2023 ABG on NIV yesterday showed pH 7.34, PCO2 of 83. Need home NIV machine. Awaiting TOC/HH to get this arranged. He cannot leave hospital until he has this machine.   07-14-2023. Home bipap machine authorization obtained. Will be send home with home bipap machine. Bipap settings IPAP 12 EPAP 5 RR 12 FiO2 40%

## 2023-07-10 NOTE — Progress Notes (Addendum)
 Called back to bedsidee  Was under impression he was going to transition comfrot but when I Got there RN informed he got fentanyl  for dyspnea relief and after taht he perked up and started being more active and wanted to drink clears etc  D/w APtient  - long conversation on BiPAP   - he agreed to do it at bedtime tonight  - we discussed BiPAP at home - informed will aseess ABG at dc -> and if co2 high will qualify for nioght BiPAP to helo  Qualtiy of lfie and flare up - after back and forth he agreed   - continue fent prn for dyspnea   - transition to comfort if he declines  - continue IV solumedrol and abx  - aim to dc iv fluids 07/11/23  10-15 min bedside  SIGNATURE    Dr. Dorethia Cave, M.D., F.C.C.P,  Pulmonary and Critical Care Medicine Staff Physician, [facility] Director - Interstitial Lung Disease  Program  Pulmonary Fibrosis [facility] at [facility] [place], [place], [address]   Pager: [phone], If no answer  -> Check AMION or Try [phone] Telephone (clinical office): [phone] Telephone (research): [phone]  6:39 PM 07/10/2023

## 2023-07-10 NOTE — Assessment & Plan Note (Addendum)
 07/10/2023 present on admission. Will get wound care consult. 07-13-2023 wound care has seen patient. No new changes.

## 2023-07-10 NOTE — Assessment & Plan Note (Addendum)
 07/10/2023 stable. No evidence of bleeding. 07-11-2023 thru 07-14-2023 stable

## 2023-07-10 NOTE — Plan of Care (Signed)
°  Problem: Education: °Goal: Knowledge of disease or condition will improve °Outcome: Progressing °Goal: Knowledge of the prescribed therapeutic regimen will improve °Outcome: Progressing °Goal: Individualized Educational Video(s) °Outcome: Progressing °  °

## 2023-07-10 NOTE — Assessment & Plan Note (Addendum)
 07/10/2023 continue crestor 20 mg qpm. 07-11-2023 thru 07-14-2023 stable

## 2023-07-10 NOTE — Hospital Course (Signed)
 HPI: Francisco [name] is a 78 y.o. male with medical history significant of hypertension, hyperlipidemia, chronic CHF, BPH, PAD, anemia, GI AVM, compression fracture, essential tremor, chronic respiratory failure with hypoxia, severe COPD, pulmonary embolism, small cell lung cancer presenting with worsening shortness of breath and cough.   Patient has had a couple weeks of cough which worsened today and associated significant shortness of breath today.  Had been evaluated by pulmonology by telephone visit and been prescribed prednisone  taper and ciprofloxacin .  Which she has started.  Is chronically on 10 mg prednisone  daily.   Today with worsening systems he took his 40 p.o. prednisone  prescribed by pulmonologist and 4 puffs of albuterol  without improvement.  Received nebulizer and route with EMS with some improvement.  Had been desaturating as low as the 70s on home 4 L which she typically takes with exertion.   Denies fevers, chills, chest pain, abdominal pain, constipation, diarrhea, nausea, vomiting.  Significant Events: Admitted 07/09/2023 for COPD exacerbation, acute on chronic respiratory failure with hypoxia and hypercapnia   Significant Labs: chloride 89, bicarb stable 42. CBC with hemoglobin stable at 9.2. Lactic acid borderline at 1.9 and then 2.2 on repeat. Respiratory panel for flu COVID RSV negative.   Significant Imaging Studies: Chest x-ray with stable chronic changes and no acute abnormality.   Antibiotic Therapy: Anti-infectives (From admission, onward)    Start     Dose/Rate Route Frequency Ordered Stop   07/10/23 1000  azithromycin  (ZITHROMAX ) tablet 500 mg        500 mg Oral Daily 07/09/23 1503     07/09/23 1430  azithromycin  (ZITHROMAX ) 500 mg in sodium chloride  0.9 % 250 mL IVPB        500 mg 250 mL/hr over 60 Minutes Intravenous  Once 07/09/23 1423 07/09/23 1531       Procedures:   Consultants:

## 2023-07-10 NOTE — Assessment & Plan Note (Addendum)
 07/10/2023 stable. 07-11-2023 stable 07-12-2023 pt was scheduled for CT chest with IV contrast as followup. Will check CT chest with IV contrast per family request. 07-13-2023 CT chest with IV contrast completed per family request. Will forward results to his oncologist.

## 2023-07-10 NOTE — Assessment & Plan Note (Addendum)
 07/10/2023 remains on crestor and eliquis. 07-11-2023 thru 07-14-2023 stable

## 2023-07-10 NOTE — Consult Note (Signed)
 NAME:  Francisco [name], MRN:  [id number], DOB:  [birth year], LOS: 0 ADMISSION DATE:  07/09/2023, CONSULTATION DATE:  07/10/23 REFERRING MD:  Dr Camellia Door, CHIEF COMPLAINT:  Acute on chronic respiratory failure    History of Present Illness:   78 year old male with advanced COPD well-known to Dr. Geronimo from the clinic.  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  Has had multiple admissions to the hospital and is on chronic prednisone , oxygen.  He also has high eosinophils but it declined Dupixent  to prevent COPD exacerbations.  He remains on Roflumilast  bronchodilator therapy and recently started on the new bronchodilator OTHUVARYUE  Seen in the office mid November 2024 and at the time sputum culture was ordered which subsequently showed Pseudomonas.  He then called the office 06/18/2023 with COPD exacerbation symptoms and then on 06/20/2023 was given Cipro  5 mg twice daily for 7 days and also short course prednisone  but then he called back 07/07/2023 with worsening symptoms and hypoxemia.  Was advised hospitalization.  He then ended up in the hospital 1//1/25 with acute exacerbation COPD and admitted by the hospitalist service  On 07/10/2023 pulmonary medicine consult called.  Just prior to arrival of the pulmonary services he complained of worsening shortness of breath and also had his visualization that he was going to die and requested comfort care.  Upon CCM MD history in the presence of wife and daughter he indicated that he was partially afraid to die because he felt he was not getting adequate care and frequent visits by the physicians but he also said he was ready to die and he felt that his disease had come to an end stage.  He also felt he was a burden on the family.  Goals of care conversation held with him, bedside nurse and family: Resolved to be no intubation no  CPR but treat with BiPAP and IV antibiotics and also concurrent comfort for air hunger.   Past Medical History:    Latest Reference Range & Units 12/05/17 10:20 04/15/19 14:06 01/24/20 15:13 01/26/20 12:29 07/10/23 10:38  pH, Arterial 7.350 - 7.450  7.420 7.423 7.454 (H) 7.446   pCO2 arterial 32.0 - 48.0 mmHg 42.6 41.0 50.6 (H) 43.0   pO2, Arterial 83.0 - 108.0 mmHg 92.0 108 141 (H) 79.1 (L)   pH, Ven 7.25 - 7.43      7.3  pCO2, Ven 44 - 60 mmHg     99 (HH)  pO2, Ven 32 - 45 mmHg     <31 (LL) (C)  (HH): Data is critically high (LL): Data is critically low (H): Data is abnormally high (L): Data is abnormally low (C): Corrected     has a past medical history of Acute GI bleeding (02/18/2020), Chronic back pain, Chronic rhinitis, Compressed spine fracture (HCC), COPD (chronic obstructive pulmonary disease) (HCC), Dyspnea, Emphysema lung (HCC), Hyperglycemia (06/12/2016), Iron  deficiency anemia due to chronic blood loss (08/23/2020), Left ankle swelling (07/22/2014), Leukocytosis (12/19/2016), Melena (08/11/2022), On home oxygen therapy, Onychomycosis, Orthostatic hypotension, PAD (peripheral artery disease) (HCC), Pneumonia, Pulmonary embolism (HCC) (04/07/2018), Skin cancer, Small cell lung cancer, right (HCC) (2023), and Vertigo.   reports that he quit smoking about 5 years ago. His smoking use included pipe and cigarettes. He started smoking about 57 years ago. He has a 104 pack-year smoking history. He has never been exposed to tobacco smoke. He has never used smokeless tobacco.  Past Surgical  History:  Procedure Laterality Date   ABDOMINAL AORTOGRAM W/LOWER EXTREMITY N/A 01/21/2020   Procedure: ABDOMINAL AORTOGRAM W/LOWER EXTREMITY;  Surgeon: Harvey Carlin BRAVO, MD;  Location: [facility];  Service: Cardiovascular;  Laterality: N/A;   APPENDECTOMY     BIOPSY  02/20/2020   Procedure: BIOPSY;  Surgeon: Wilhelmenia Aloha Raddle., MD;  Location: [facility] ENDOSCOPY;  Service:  Gastroenterology;;   BIOPSY  08/12/2022   Procedure: BIOPSY;  Surgeon: San Sandor GAILS, DO;  Location: [facility] ENDOSCOPY;  Service: Gastroenterology;;   BRONCHIAL BIOPSY  09/04/2021   Procedure: BRONCHIAL BIOPSIES;  Surgeon: Brenna Adine CROME, DO;  Location: [facility] ENDOS[facility];  Service: Pulmonary;;   BRONCHIAL BRUSHINGS  09/04/2021   Procedure: BRONCHIAL BRUSHINGS;  Surgeon: Brenna Adine CROME, DO;  Location: [facility];  Service: Pulmonary;;   BRONCHIAL NEEDLE ASPIRATION BIOPSY  09/04/2021   Procedure: BRONCHIAL NEEDLE ASPIRATION BIOPSIES;  Surgeon: Brenna Adine CROME, DO;  Location: [facility]OS[facility];  Service: Pulmonary;;   CATARACT EXTRACTION, BILATERAL     ENDARTERECTOMY FEMORAL Right 04/19/2019   Procedure: ENDARTERECTOMY RIGHT COMMON FEMORAL;  Surgeon: Harvey Carlin BRAVO, MD;  Location[facility] OR;  Service: Vascular;  Laterality: Right;   ENDARTERECTOMY FEMORAL Left 01/24/2020   Procedure: LEFT FEMORAL ENDARTERECTOMY WITH DACRON PATCH ANGIOPLASTY;  Surgeon: Harvey Carlin BRAVO, MD;  Location: [facility] OR;  Service: Vascular;  Laterality: Left;   ESOPHAGOGASTRODUODENOSCOPY (EGD) WITH PROPOFOL  N/A 02/20/2020   Procedure: ESOPHAGOGASTRODUODENOSCOPY (EGD) WITH PROPOFOL ;  Surgeon: Wilhelmenia Aloha Raddle., MD;  Location: [facility] ENDOSCOPY;  Service: Gastroenterology;  Laterality: N/A;   ESOPHAGOGASTRODUODENOSCOPY (EGD) WITH PROPOFOL  N/A 08/12/2022   Procedure: ESOPHAGOGASTRODUODENOSCOPY (EGD) WITH PROPOFOL ;  Surgeon: San Sandor GAILS, DO;  Location: [facility];  Service: Gastroenterology;  Laterality: N/A;   FEMORAL ENDARTERECTOMY Left 01/24/2020   FIDUCIAL MARKER PLACEMENT  09/04/2021   Procedure: FIDUCIAL MARKER PLACEMENT;  Surgeon: Brenna Adine CROME, DO;  Location: [facility];  Service: Pulmonary;;   HEMOSTASIS CLIP PLACEMENT  02/20/2020   Procedure: HEMOSTASIS CLIP PLACEMENT;  Surgeon: Wilhelmenia Aloha Raddle., MD;  Location: [facility] ENDOSCOPY;  Service: Gastroenterology;;   HEMOSTASIS CLIP PLACEMENT  08/12/2022   Procedure:  HEMOSTASIS CLIP PLACEMENT;  Surgeon: San Sandor GAILS, DO;  Location[facility];  Service: Gastroenterology;;   HOT HEMOSTASIS N/A 02/20/2020   Procedure: HOT HEMOSTASIS (ARGON PLASMA COAGULATION/BICAP);  Surgeon: Wilhelmenia Aloha Raddle., MD;  Location: [facility] ENDOSCOPY;  Service: Gastroenterology;  Laterality: N/A;   HOT HEMOSTASIS N/A 08/12/2022   Procedure: HOT HEMOSTASIS (ARGON PLASMA COAGULATION/BICAP);  Surgeon: San Sandor GAILS, DO;  Location: [facility] ENDOSCOPY;  Service: Gastroenterology;  Laterality: N/A;   INSERTION OF ILIAC STENT Left 01/24/2020   Procedure: INSERTION OF VBX STENT 8X59 AND 8X39 LEFT COMMON ILIAC ARTERY. INSERTION OF INNOVA 7 X 60 INNOVA STENT LEFT EXTERNAL ILIAC ARTERY. ;  Surgeon: Harvey Carlin BRAVO, MD;  Location: [facility] OR;  Service: Vascular;  Laterality: Left;   LOWER EXTREMITY ANGIOGRAPHY  12/11/2018   LOWER EXTREMITY ANGIOGRAPHY N/A 12/11/2018   Procedure: LOWER EXTREMITY ANGIOGRAPHY;  Surgeon: Harvey Carlin BRAVO, MD;  [facility];  Service: Cardiovascular;  Laterality: N/A;   PATCH ANGIOPLASTY Right 04/19/2019   Procedure: Patch Angioplasty;  Surgeon: Harvey Carlin BRAVO, MD;  Location: [facility] OR;  Service: Vascular;  Laterality: Right;   PERIPHERAL VASCULAR INTERVENTION Right 12/11/2018   Procedure: PERIPHERAL VASCULAR INTERVENTION;  Surgeon: Harvey Carlin BRAVO, MD;  [facility]: [facility];  Service: Cardiovascular;  Laterality: Right;  Common Iliac    SKIN CANCER EXCISION     lips,  face, ears, arms (04/07/2018)   SUBMUCOSAL INJECTION  08/12/2022   Procedure: SUBMUCOSAL INJECTION;  Surgeon: San Sandor GAILS, DO;  Location: [facility] ENDOS[facility];  Service: Gastroenterology;;   TONSILLECTOMY     ULTRASOUND GUIDANCE FOR VASCULAR ACCESS Right 01/24/2020   Procedure: ULTRASOUND GUIDANCE FOR VASCULAR ACCESS;  Surgeon: Harvey Carlin BRAVO, MD;  Location: [facility] OR;  Service: Vascular;  Laterality: Right;   VIDEO BRONCHOSCOPY WITH RADIAL ENDOBRONCHIAL ULTRASOUND  09/04/2021    Procedure: VIDEO BRONCHOSCOPY WITH RADIAL ENDOBRONCHIAL ULTRASOUND;  Surgeon: Brenna Adine CROME, DO;  Location: [facility] ENDOSCOPY;  Service: Pulmonary;;    Allergies  Allergen Reactions   Tape Other (See Comments)    Skin tears easily   Augmentin  [Amoxicillin -Pot Clavulanate] Other (See Comments)    Thrush Sore throat Hoarse voice   Gadavist  [email or web address] ] Other (See Comments)    Unknown reaction   Gadolinium Itching, Rash and Other (See Comments)    Itchy back rash   Levaquin  [Levofloxacin ] Other (See Comments)    Hallucinations    Lipitor [Atorvastatin ] Rash    Immunization History  Administered Date(s) Administered   Fluad Quad(high Dose 65+) 03/16/2019, 03/10/2020   Fluad Trivalent(High Dose 65+) 03/14/2023   Influenza Split 04/24/2012   Influenza Whole 05/08/2009, 04/08/2011   Influenza, High Dose Seasonal PF 05/01/2017, 03/31/2018, 04/17/2021, 04/03/2022   Influenza,inj,Quad PF,6+ Mos 04/27/2013, 04/15/2014, 04/24/2015   Influenza,inj,quad, With Preservative 05/01/2018   Influenza-Unspecified 05/13/2016   PFIZER Comirnaty(Gray Top)Covid-19 Tri-Sucrose Vaccine 11/24/2020, 04/03/2022, 03/17/2023   PFIZER(Purple Top)SARS-COV-2 Vaccination 07/30/2019, 08/20/2019, 04/07/2020   Pfizer Covid-19 Vaccine Bivalent Booster 80yrs & up 03/16/2021   Pneumococcal Conjugate-13 02/25/2014   Pneumococcal Polysaccharide-23 08/15/2011, 12/19/2016   Pneumococcal-Unspecified 03/08/2017   Respiratory Syncytial Virus Vaccine,Recomb Aduvanted(Arexvy) 06/06/2022   Td 02/21/2010   Tdap 11/05/2017   Zoster, Live 01/29/2012    Family History  Problem Relation Age of Onset   Heart disease Mother        CABG in her 23s, nonsmoker   Cancer Mother    Stroke Father    Heart disease Father        Died of MI at age 61, smoker   Hepatitis Sister    Coronary artery disease Other        male 1st degree relative <60   Colon cancer Neg Hx    Pancreatic cancer Neg Hx    Esophageal cancer Neg Hx     Inflammatory bowel disease Neg Hx    Liver disease Neg Hx    Rectal cancer Neg Hx    Stomach cancer Neg Hx      Current Facility-Administered Medications:    acetaminophen  (TYLENOL ) tablet 650 mg, 650 mg, Oral, Q6H PRN, 650 mg at 07/10/23 0321 **OR** acetaminophen  (TYLENOL ) suppository 650 mg, 650 mg, Rectal, Q6H PRN, Melvin, Alexander B, MD   albuterol  (PROVENTIL ) (2.5 MG/3ML) 0.083% nebulizer solution 2.5 mg, 2.5 mg, Nebulization, Q2H PRN, Melvin, Alexander B, MD   apixaban  (ELIQUIS ) tablet 2.5 mg, 2.5 mg, Oral, BID, Melvin, Alexander B, MD, 2.5 mg at 07/10/23 0912   [START ON 07/11/2023] azithromycin  (ZITHROMAX ) tablet 500 mg, 500 mg, Oral, Q M,W,F, Laurence Locus, DO   ciprofloxacin  (CIPRO ) tablet 750 mg, 750 mg, Oral, BID, Laurence Locus, DO, 750 mg at 07/10/23 0911   dextrose  5 % and 0.45 % NaCl infusion, , Intravenous, Continuous, Thien Berka, MD   ipratropium-albuterol  (DUONEB) 0.5-2.5 (3) MG/3ML nebulizer solution 3 mL, 3 mL, Nebulization, Q6H, Laurence Locus, DO, 3 mL at 07/10/23 0843   pantoprazole  (PROTONIX ) EC tablet  20 mg, 20 mg, Oral, Daily, Melvin, Alexander B, MD, 20 mg at 07/10/23 0910   polyethylene glycol (MIRALAX  / GLYCOLAX ) packet 17 g, 17 g, Oral, Daily PRN, Melvin, Alexander B, MD   polyvinyl alcohol  (LIQUIFILM TEARS) 1.4 % ophthalmic solution 1 drop, 1 drop, Both Eyes, QODAY, Merilee Linsey I, RPH, 1 drop at 07/10/23 0912   [COMPLETED] methylPREDNISolone  sodium succinate (SOLU-MEDROL ) 125 mg/2 mL injection 125 mg, 125 mg, Intravenous, Q12H, 125 mg at 07/10/23 0309 **FOLLOWED BY** predniSONE  (DELTASONE ) tablet 40 mg, 40 mg, Oral, Q breakfast, Melvin, Alexander B, MD, 40 mg at 07/10/23 1445   roflumilast  (DALIRESP ) tablet 500 mcg, 500 mcg, Oral, Daily, Melvin, Alexander B, MD, 500 mcg at 07/10/23 9088   rosuvastatin  (CRESTOR ) tablet 20 mg, 20 mg, Oral, QPM, Melvin, Alexander B, MD, 20 mg at 07/09/23 1834   sodium chloride  flush (NS) 0.9 % injection 3 mL, 3 mL, Intravenous,  Q12H, Melvin, Alexander B, MD, 3 mL at 07/10/23 9077   traMADol  (ULTRAM ) tablet 50 mg, 50 mg, Oral, Q12H PRN, Laurence Locus, DO     Significant Hospital Events:  07/09/2023 - admit   Interim History / Subjective:   07/10/2023 - seen in bed [id number]  Objective   Blood pressure 116/82, pulse (!) 116, temperature (!) 97.5 F (36.4 C), temperature source Axillary, resp. rate (!) 21, SpO2 (!) 86%.        Intake/Output Summary (Last 24 hours) at 07/10/2023 1447 Last data filed at 07/10/2023 1219 Gross per 24 hour  Intake 243 ml  Output 200 ml  Net 43 ml   There were no vitals filed for this visit.  Examination: General: Looks chronically unwell and cushingoid with significant skin bruises.  He was lying in the bed with a facemask on. HENT: Had guttoral versus breathing. Lungs: Barrel chested.  Some amount of respiratory distressis pulse ox is staying 95% but he was complaining of air hunger..  It was given facemask oxygen. Cardiovascular: Tachycardic sinus Abdomen: Soft nontender Extremities: Intact with bruises Neuro: Alert and oriented x 3 GU: Not examined  Resolved Hospital Problem list   X  Assessment & Plan:  ASSESSMENT / PLAN:  PULMONARY  A:  History of pulmonary embolism in the past currently on DOAC History of advanced COPD on chronic steroids bronchodilators [refused about Dupixent ] Roflumilast   Currently: Acute on chronic respiratory failure with hypoxemia and also new onset hypercapnia -in the setting of COPD exacerbation and failed outpatient antibiotic therapy with ciprofloxacin  for colonization with Pseudomonas     P:   Change antibiotics to IV -start vancomycin  [check MRSA PCR], change oral Cipro  to IV ceftazidime , continue azithromycin  [check urine strep and rest of RVP panel_  Check sputum Gram stain and culture  Increase bronchodilator frequency to every 4 hours  Start 3% hypertonic saline for increased sputum expectoration  Continue Roflumilast   Change  oral to IV steroids  Start BiPAP for air hunger and for hypercapnia  DNR and no CPR if he arrests or gets worse  Transition to comfort if he gets worse  Fentanyl  e and Versed  as needed for air hunger and comfort     Best practice (daily eval):  N.p.o. but with clears and start D5 D5 and okay for medications:    Goals of Care:   Interdisciplinary Goals of Care Family Meeting   Date carried out:: 07/10/2023  Location of the meeting: Bedside  Member's involved: Physician, Bedside Registered Nurse, Family Member or next of kin, and Other: daugher whife  Durable Power of Insurance risk surveyor: wife    Discussion: We discussed goals of care for BANNON GIAMMARCO II .  yes  Code status: NO CPR, NO intubation but full medical care   Disposition: Continue current acute care   Time spent for the meeting: 10 min   ATTESTATION & SIGNATURE   The patient LENY MOROZOV II is critically ill with multiple organ systems failure and requires high complexity decision making for assessment and support, frequent evaluation and titration of therapies, application of advanced monitoring technologies and extensive interpretation of multiple databases.   Critical Care Time devoted to patient care services described in this note is  45  Minutes. This time reflects time of care of this signee Dr Dorethia Cave. This critical care time does not reflect procedure time, or teaching time or supervisory time of PA/NP/Med student/Med Resident etc but could involve care discussion time      SIGNATURE    Dr. Dorethia Cave, M.D., F.C.C.P,  Pulmonary and Critical Care Medicine Staff Physician, [facility] Director - Interstitial Lung Disease  Program  [facility] at [facility] [place], [place], [address]  NPI Number:  NPI #[id number]  Pager: [phone], If no answer  -> Check AMION or Try [phone] Telephone  (clinical office): [phone] Telephone (research): [phone]  2:47 PM 07/10/2023   07/10/2023 2:47 PM    LABS    Latest Reference Range & Units 12/05/17 10:20 04/15/19 14:06 01/24/20 15:13 01/26/20 12:29 07/10/23 10:38  pH, Arterial 7.350 - 7.450  7.420 7.423 7.454 (H) 7.446   pCO2 arterial 32.0 - 48.0 mmHg 42.6 41.0 50.6 (H) 43.0   pO2, Arterial 83.0 - 108.0 mmHg 92.0 108 141 (H) 79.1 (L)   pH, Ven 7.25 - 7.43      7.3  pCO2, Ven 44 - 60 mmHg     99 (HH)  pO2, Ven 32 - 45 mmHg     <31 (LL) (C)  (HH): Data is critically high (LL): Data is critically low (H): Data is abnormally high (L): Data is abnormally low (C): Corrected PULMONARY Recent Labs  Lab 07/10/23 1038  HCO3 49.0*  O2SAT 22.9    CBC Recent Labs  Lab 07/09/23 1001 07/10/23 0643  HGB 9.2* 8.5*  HCT 31.0* 28.8*  WBC 10.4 3.6*  PLT 167 141*    COAGULATION No results for input(s): INR in the last 168 hours.  CARDIAC  No results for input(s): TROPONINI in the last 168 hours. No results for input(s): PROBNP in the last 168 hours.  CHEMISTRY Recent Labs  Lab 07/09/23 1001 07/10/23 0643  NA 141 140  K 4.2 5.1  CL 89* 90*  CO2 42* 43*  GLUCOSE 128* 117*  BUN 15 17  CREATININE 0.99 0.99  CALCIUM  9.4 9.0   CrCl cannot be calculated (Unknown ideal weight.).   LIVER Recent Labs  Lab 07/10/23 0643  AST 22  ALT 16  ALKPHOS 43  BILITOT 0.5  PROT 5.8*  ALBUMIN  3.1*     INFECTIOUS Recent Labs  Lab 07/09/23 1001 07/09/23 1249  LATICACIDVEN 1.9 2.2*     ENDOCRINE CBG (last 3)  No results for input(s): GLUCAP in the last 72 hours.       IMAGING x48h  - image(s) personally visualized  -   highlighted in bold DG Chest Portable 1 View Result Date: 07/09/2023 CLINICAL DATA:  COPD, shortness of  breath, cough EXAM: PORTABLE CHEST - 1 VIEW COMPARISON:  02/20/2023 FINDINGS: Chronic biapical pleuroparenchymal scarring. Somewhat coarse pulmonary interstitial markings as  before. Fiducial marker projects over the right hilum as before. No new infiltrate or overt edema. Heart size and mediastinal contours are within normal limits. Aortic Atherosclerosis (ICD10-170.0). No effusion. Visualized bones unremarkable. IMPRESSION: Chronic changes without acute findings. Electronically Signed   By: JONETTA Faes M.D.   On: 07/09/2023 10:11

## 2023-07-11 DIAGNOSIS — I2782 Chronic pulmonary embolism: Secondary | ICD-10-CM | POA: Diagnosis present

## 2023-07-11 DIAGNOSIS — Z66 Do not resuscitate: Secondary | ICD-10-CM

## 2023-07-11 DIAGNOSIS — E785 Hyperlipidemia, unspecified: Secondary | ICD-10-CM | POA: Diagnosis present

## 2023-07-11 DIAGNOSIS — J9622 Acute and chronic respiratory failure with hypercapnia: Secondary | ICD-10-CM

## 2023-07-11 DIAGNOSIS — I739 Peripheral vascular disease, unspecified: Secondary | ICD-10-CM | POA: Diagnosis present

## 2023-07-11 DIAGNOSIS — J9621 Acute and chronic respiratory failure with hypoxia: Secondary | ICD-10-CM | POA: Diagnosis present

## 2023-07-11 DIAGNOSIS — R0602 Shortness of breath: Secondary | ICD-10-CM | POA: Diagnosis present

## 2023-07-11 DIAGNOSIS — I110 Hypertensive heart disease with heart failure: Secondary | ICD-10-CM | POA: Diagnosis present

## 2023-07-11 DIAGNOSIS — Z9981 Dependence on supplemental oxygen: Secondary | ICD-10-CM | POA: Diagnosis not present

## 2023-07-11 DIAGNOSIS — Z87891 Personal history of nicotine dependence: Secondary | ICD-10-CM | POA: Diagnosis not present

## 2023-07-11 DIAGNOSIS — D62 Acute posthemorrhagic anemia: Secondary | ICD-10-CM | POA: Diagnosis not present

## 2023-07-11 DIAGNOSIS — N401 Enlarged prostate with lower urinary tract symptoms: Secondary | ICD-10-CM | POA: Diagnosis present

## 2023-07-11 DIAGNOSIS — E874 Mixed disorder of acid-base balance: Secondary | ICD-10-CM | POA: Diagnosis present

## 2023-07-11 DIAGNOSIS — G250 Essential tremor: Secondary | ICD-10-CM | POA: Diagnosis present

## 2023-07-11 DIAGNOSIS — S51012A Laceration without foreign body of left elbow, initial encounter: Secondary | ICD-10-CM | POA: Diagnosis present

## 2023-07-11 DIAGNOSIS — J441 Chronic obstructive pulmonary disease with (acute) exacerbation: Secondary | ICD-10-CM | POA: Diagnosis present

## 2023-07-11 DIAGNOSIS — I5022 Chronic systolic (congestive) heart failure: Secondary | ICD-10-CM | POA: Diagnosis present

## 2023-07-11 DIAGNOSIS — Z2239 Carrier of other specified bacterial diseases: Secondary | ICD-10-CM

## 2023-07-11 DIAGNOSIS — S51011A Laceration without foreign body of right elbow, initial encounter: Secondary | ICD-10-CM | POA: Diagnosis present

## 2023-07-11 DIAGNOSIS — S41112A Laceration without foreign body of left upper arm, initial encounter: Secondary | ICD-10-CM | POA: Diagnosis present

## 2023-07-11 DIAGNOSIS — C3431 Malignant neoplasm of lower lobe, right bronchus or lung: Secondary | ICD-10-CM | POA: Diagnosis present

## 2023-07-11 DIAGNOSIS — J9611 Chronic respiratory failure with hypoxia: Secondary | ICD-10-CM | POA: Diagnosis not present

## 2023-07-11 DIAGNOSIS — Z923 Personal history of irradiation: Secondary | ICD-10-CM | POA: Diagnosis not present

## 2023-07-11 DIAGNOSIS — Z85828 Personal history of other malignant neoplasm of skin: Secondary | ICD-10-CM | POA: Diagnosis not present

## 2023-07-11 DIAGNOSIS — Z1152 Encounter for screening for COVID-19: Secondary | ICD-10-CM | POA: Diagnosis not present

## 2023-07-11 DIAGNOSIS — J439 Emphysema, unspecified: Secondary | ICD-10-CM | POA: Diagnosis present

## 2023-07-11 DIAGNOSIS — R351 Nocturia: Secondary | ICD-10-CM | POA: Diagnosis present

## 2023-07-11 LAB — PHOSPHORUS: Phosphorus: 2.3 mg/dL — ABNORMAL LOW (ref 2.5–4.6)

## 2023-07-11 LAB — BLOOD GAS, VENOUS
Acid-Base Excess: 18.7 mmol/L — ABNORMAL HIGH (ref 0.0–2.0)
Bicarbonate: 47.4 mmol/L — ABNORMAL HIGH (ref 20.0–28.0)
O2 Saturation: 81.1 %
Patient temperature: 36.8
pCO2, Ven: 72 mm[Hg] (ref 44–60)
pH, Ven: 7.42 (ref 7.25–7.43)
pO2, Ven: 46 mm[Hg] — ABNORMAL HIGH (ref 32–45)

## 2023-07-11 LAB — BLOOD GAS, ARTERIAL
Acid-Base Excess: 19.2 mmol/L — ABNORMAL HIGH (ref 0.0–2.0)
Bicarbonate: 48 mmol/L — ABNORMAL HIGH (ref 20.0–28.0)
O2 Saturation: 99.9 %
Patient temperature: 37
pCO2 arterial: 74 mm[Hg] (ref 32–48)
pH, Arterial: 7.42 (ref 7.35–7.45)
pO2, Arterial: 122 mm[Hg] — ABNORMAL HIGH (ref 83–108)

## 2023-07-11 LAB — TROPONIN I (HIGH SENSITIVITY): Troponin I (High Sensitivity): 30 ng/L — ABNORMAL HIGH (ref <18)

## 2023-07-11 LAB — MAGNESIUM: Magnesium: 2.4 mg/dL (ref 1.7–2.4)

## 2023-07-11 LAB — PROCALCITONIN: Procalcitonin: <0.1 ng/mL

## 2023-07-11 LAB — BRAIN NATRIURETIC PEPTIDE: B Natriuretic Peptide: 221.1 pg/mL — ABNORMAL HIGH (ref 0.0–100.0)

## 2023-07-11 MED ORDER — METHYLPREDNISOLONE SODIUM SUCC 125 MG IJ SOLR
80.0000 mg | Freq: Two times a day (BID) | INTRAMUSCULAR | Status: DC
  Administered 2023-07-11 (×2): 80 mg via INTRAVENOUS
  Filled 2023-07-11 (×2): qty 2

## 2023-07-11 NOTE — Assessment & Plan Note (Addendum)
 07-10-2023 VBG obtained this AM. pH 7.3, PCO2 99, bicarb 49. Has both acute and chronic respiratory acidosis with metabolic alkalosis.  PCCM consulted. May need NIV at home. Discussed at length with pt's wife and dtr. Placed on NIV per PCCM. Keep NPO for now. 07-11-2023 respiratory status stable enough this AM to start diet.  Continue with NIV during sleep.  Baseline PCO2 appears to be in the low 70s.  pH this AM 7.42 07-12-2023 ABG on NIV this AM shows pH 7.4 with PCO2 of 68. TOC working on obtaining NIV for home use. 07-13-2023 pt needs ABG on NIV to document his need for home NIV machine. Getting this done today.  07-14-2023 ABG on NIV yesterday showed pH 7.34, PCO2 of 83. Need home NIV machine. Awaiting TOC/HH to get this arranged. He cannot leave hospital until he has this machine.  *update. Home Bipap machine authorization obtained and can be delivered to his home today. Settings: IPAP 12 EPAP 5 RR 12 FiO2 40%

## 2023-07-11 NOTE — Progress Notes (Signed)
 Date and time results received: 07/11/23 0851  Test: Venous blood gas Critical Value: Pco2 72  Name of Provider Notified: Dr. Carollee Herter  Orders Received? Or Actions Taken?:  No new orders received.

## 2023-07-11 NOTE — TOC Initial Note (Addendum)
 Transition of Care [facility]) - Initial/Assessment Note    Patient Details  Name: Francisco [name] MRN: [id number] Date of Birth: 1946-02-24  Transition of Care [facility]) CM/SW Contact:    Corean JAYSON Canary, RN Phone Number: 07/11/2023, 3:30 PM  Clinical Narrative:                 Discussed earlier with Dr Laurence regarding Potential NIV for home. Patient  was thinking of  not moving forward with treatment, but then received pain medicine nad started doing better. Was on and off Bipap. Normally is on 4-5 LPM at home oxygen from [employer] He has DME BSC, walker.  Will need ABG on Bipap , patient previously only had VBG. Dr Laurence will order this.  Will place narrative and receive order sheet from [employer].   Patient will likely need HH services, previously had PT and RN has BSC and walker at home. Disposition will be determined by patients progress, Currently a DNR, full treatments versus more palliative hospice related. Home oxygen can only go to 10L  TOC will continue to follow 1730 ABG done but not on Bipap as requested. Will  discuss with [employer]   Barriers to Discharge: Continued Medical Work up   Patient Goals and CMS Choice            Expected Discharge Plan and Services       Living arrangements for the past 2 months: Single Family Home                 DME Arranged: NIV DME Agency: [employer] Date DME Agency Contacted: 07/11/23 Time DME Agency Contacted: 1521 Representative spoke with at DME Agency: [facility] Arranged:  [place] [facility])          Prior Living Arrangements/Services Living arrangements for the past 2 months: Single Family Home Lives with:: Spouse Patient language and need for interpreter reviewed:: Yes        Need for Family Participation in Patient Care: Yes (Comment) Care giver support system in place?: Yes (comment) Current home services: DME [facility]) Criminal Activity/Legal Involvement Pertinent to Current Situation/Hospitalization: No  - Comment as needed  Activities of Daily Living      Permission Sought/Granted                  Emotional Assessment       Orientation: : Fluctuating Orientation (Suspected and/or reported Sundowners)   Psych Involvement: No (comment)  Admission diagnosis:  COPD exacerbation (HCC) [J44.1] Acute on chronic respiratory failure with hypoxia (HCC) [J96.21] Skin tear of right elbow without complication, initial encounter [S51.011A] Acute on chronic respiratory failure with hypoxia and hypercapnia (HCC) [G03.78, J96.22] Patient Active Problem List   Diagnosis Date Noted   Acute on chronic respiratory failure with hypoxia and hypercapnia (HCC) 07/11/2023   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   Skin tear of elbow without complication, left, initial encounter 07/10/2023   Vitamin D  deficiency 03/03/2023   Syncope and collapse 02/20/2023   Lumbar compression fracture (HCC) 09/11/2022   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   Gastritis and gastroduodenitis 08/12/2022   Chronic systolic congestive heart failure (HCC) 12/10/2021   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   History of upper GI bleeding 04/30/2020   Hiatal hernia 04/30/2020   Chronic anticoagulation 04/30/2020   Anemia 04/30/2020   Malignant neoplasm of lower lobe of right lung (HCC)    PAD (peripheral artery disease) (HCC) 12/11/2018   Pre-operative respiratory examination  12/10/2018   COPD with acute exacerbation (HCC) 11/27/2018   Stage 4 very severe COPD by GOLD classification (HCC) 11/27/2018   DNR (do not resuscitate) 04/21/2018   Diastolic dysfunction 04/21/2018   Chronic pulmonary embolism (HCC) 04/07/2018   Essential tremor 03/31/2018   BPPV (benign paroxysmal positional vertigo), right 02/10/2018   Osteoporosis 11/19/2017   Thoracic compression fracture (HCC) 11/02/2017   Essential hypertension 11/02/2017   Venous stasis dermatitis of both lower extremities 11/02/2017    Chronic respiratory failure with hypoxia and hypercapnia (HCC) - baseline PCO2 in the 70s 09/23/2017   BPH associated with nocturia 06/25/2017   Hyperlipidemia 07/22/2014   History of skin cancer 05/17/2014   Chronic rhinitis 10/28/2012   Pulmonary nodule 09/23/2011   Former smoker 08/23/2008   History of colonic polyps 08/23/2008   PCP:  [name], MD Pharmacy:   [facility] [phone] - [place], [place] - [address] [place] [address] Phone: [phone] Fax: [phone]     Social Drivers of Health (SDOH) Social History: SDOH Screenings   Food Insecurity: No Food Insecurity (02/03/2023)  Housing: Low Risk  (02/03/2023)  Transportation Needs: No Transportation Needs (02/03/2023)  Alcohol  Screen: Low Risk  (02/03/2023)  Depression (PHQ2-9): Low Risk  (03/03/2023)  Financial Resource Strain: Low Risk  (02/03/2023)  Physical Activity: Unknown (02/03/2023)  Social Connections: Moderately Isolated (02/03/2023)  Stress: No Stress Concern Present (02/03/2023)  Tobacco Use: Medium Risk (05/26/2023)   SDOH Interventions:     Readmission Risk Interventions    08/13/2022    1:46 PM  Readmission Risk Prevention Plan  Transportation Screening Complete  PCP or Specialist Appt within 5-7 Days Complete  Home Care Screening Complete  Medication Review (RN CM) Complete

## 2023-07-11 NOTE — Plan of Care (Signed)
  Problem: Education: Goal: Knowledge of disease or condition will improve Outcome: Progressing   Problem: Activity: Goal: Will verbalize the importance of balancing activity with adequate rest periods Outcome: Progressing   Problem: Respiratory: Goal: Levels of oxygenation will improve Outcome: Progressing   Problem: Education: Goal: Knowledge of General Education information will improve Description: Including pain rating scale, medication(s)/side effects and non-pharmacologic comfort measures Outcome: Progressing   Problem: Clinical Measurements: Goal: Respiratory complications will improve Outcome: Progressing   Problem: Nutrition: Goal: Adequate nutrition will be maintained Outcome: Progressing   Problem: Coping: Goal: Level of anxiety will decrease Outcome: Progressing

## 2023-07-11 NOTE — Progress Notes (Signed)
 PROGRESS NOTE    Francisco [name]  FMW:[id number] DOB: 1945-08-18 DOA: 07/09/2023 PCP: [name], MD  Subjective: Pt seen and examined.  Met with pt's son derek at bedside Pt slept with NIV last night. Repeat VBG today shows pH 7.42 PCO2 of 72. Consistent with chronic hypercapnic respiratory failure.  Pt is feeling better.   Hospital Course: HPI: Francisco [name] is a 78 y.o. male with medical history significant of hypertension, hyperlipidemia, chronic CHF, BPH, PAD, anemia, GI AVM, compression fracture, essential tremor, chronic respiratory failure with hypoxia, severe COPD, pulmonary embolism, small cell lung cancer presenting with worsening shortness of breath and cough.   Patient has had a couple weeks of cough which worsened today and associated significant shortness of breath today.  Had been evaluated by pulmonology by telephone visit and been prescribed prednisone  taper and ciprofloxacin .  Which she has started.  Is chronically on 10 mg prednisone  daily.   Today with worsening systems he took his 42 p.o. prednisone  prescribed by pulmonologist and 4 puffs of albuterol  without improvement.  Received nebulizer and route with EMS with some improvement.  Had been desaturating as low as the 70s on home 4 L which she typically takes with exertion.   Denies fevers, chills, chest pain, abdominal pain, constipation, diarrhea, nausea, vomiting.  Significant Events: Admitted 07/09/2023 for COPD exacerbation, acute on chronic respiratory failure with hypoxia and hypercapnia   Significant Labs: chloride 89, bicarb stable 42. CBC with hemoglobin stable at 9.2. Lactic acid borderline at 1.9 and then 2.2 on repeat. Respiratory panel for flu COVID RSV negative.   Significant Imaging Studies: Chest x-ray with stable chronic changes and no acute abnormality.   Antibiotic Therapy: Anti-infectives (From admission, onward)    Start     Dose/Rate Route Frequency Ordered Stop   07/10/23  1000  azithromycin  (ZITHROMAX ) tablet 500 mg        500 mg Oral Daily 07/09/23 1503     07/09/23 1430  azithromycin  (ZITHROMAX ) 500 mg in sodium chloride  0.9 % 250 mL IVPB        500 mg 250 mL/hr over 60 Minutes Intravenous  Once 07/09/23 1423 07/09/23 1531       Procedures:   Consultants:     Assessment and Plan: Acute on chronic respiratory failure with hypoxia and hypercapnia (HCC) 07-10-2023 VBG obtained this AM. pH 7.3, PCO2 99, bicarb 49. Has both acute and chronic respiratory acidosis with metabolic alkalosis.  PCCM consulted. May need NIV at home. Discussed at length with pt's wife and dtr. Placed on NIV per PCCM. Keep NPO for now.  07-11-2023 respiratory status stable enough this AM to start diet.  Continue with NIV during sleep.  Baseline PCO2 appears to be in the low 70s.  pH this AM 7.42  COPD with acute exacerbation (HCC) 07-10-2023 defer management to PCCM. He was placed on IV steroids, IV ceftaz and IV vanco. On NIV. PCCM wrote to change to IV steroids. Pt received 125 mg IV solumedrol yesterday AM. Will continue with 80 mg q12h IV solumedrol. Continue with NIV. Will ask CM to look into home NIV for him.  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 now on IV Ceftaz and vanco per PCCM. 07/11/2023 continue IV ceftax and vanco per PCCM  Chronic systolic congestive heart failure (HCC) 07/10/2023 stable. Holding lasix  for now as this could contribute to alkalosis. 07-11-2023 stable. Repeat BMP in AM.  Anemia 07/10/2023 stable. 07-11-2023 stable  H/O arteriovenous malformation (AVM) 07/10/2023  stable. No evidence of bleeding. 07-11-2023 stable  Malignant neoplasm of lower lobe of right lung (HCC) 07/10/2023 stable. 07-11-2023 stable  PAD (peripheral artery disease) (HCC) 07/10/2023 remains on crestor  and eliquis . 07-11-2023 stable  Chronic pulmonary embolism (HCC) 07/10/2023 continue with Eliquis  2.5 mg bid. 07-11-2023 stable  Essential  hypertension 07/10/2023 stable 07-11-2023 stable  BPH associated with nocturia 07/10/2023 chronic 07-11-2023 stable  Hyperlipidemia 07/10/2023 continue crestor  20 mg qpm. 07-11-2023 stable  Skin tear of elbow without complication, left, initial encounter 07/10/2023 present on admission. Will get wound care consult.  DNR (do not resuscitate) 07/10/2023 PCCM discussed with pt, wife and dtr. Pt was made DNR/DNI.  Chronic respiratory failure with hypoxia and hypercapnia (HCC) - baseline PCO2 in the 70s 07/10/2023 evidence of chronic CO2 retention with VBG showing pH of 7.3 and PCO2 99.  Pt may need NIV at home/discharge. 07/11/2023 baseline PCO2 in the low 70s. This AM pH 7.42. CM looking into home NIV.       DVT prophylaxis: apixaban  (ELIQUIS ) tablet 2.5 mg Start: 07/09/23 2200 apixaban  (ELIQUIS ) tablet 2.5 mg     Code Status: Limited: Do not attempt resuscitation (DNR) -DNR-LIMITED -Do Not Intubate/DNI  Family Communication: discussed with pt's son derek at bedside Disposition Plan: return home Reason for continuing need for hospitalization: remains on IV ABX, IV steroids.  Objective: Vitals:   07/11/23 0316 07/11/23 0407 07/11/23 0414 07/11/23 0804  BP: (!) 117/59   (!) 122/54  Pulse: (!) 108  (!) 114 84  Resp: 20  15 17   Temp: 98.3 F (36.8 C)   98.2 F (36.8 C)  TempSrc: Oral   Oral  SpO2: 98% 93% 90% 95%    Intake/Output Summary (Last 24 hours) at 07/11/2023 1030 Last data filed at 07/11/2023 0847 Gross per 24 hour  Intake 3 ml  Output 700 ml  Net -697 ml   There were no vitals filed for this visit.  Examination:  Physical Exam Vitals and nursing note reviewed.  Constitutional:      General: He is not in acute distress.    Appearance: He is not toxic-appearing.     Comments: Appears chronically ill  HENT:     Head: Normocephalic and atraumatic.     Nose: Nose normal.  Cardiovascular:     Rate and Rhythm: Regular rhythm. Tachycardia present.   Pulmonary:     Effort: No respiratory distress.     Comments: Diminished BS today. But overall looking better. Off NIV Abdominal:     General: Bowel sounds are normal.     Palpations: Abdomen is soft.  Skin:    General: Skin is warm and dry.     Capillary Refill: Capillary refill takes less than 2 seconds.     Comments: Left elbow wrapped in gauze  Neurological:     Mental Status: He is alert. He is disoriented.     Data Reviewed: I have personally reviewed following labs and imaging studies  CBC: Recent Labs  Lab 07/09/23 1001 07/10/23 0643  WBC 10.4 3.6*  HGB 9.2* 8.5*  HCT 31.0* 28.8*  MCV 100.6* 100.0  PLT 167 141*   Basic Metabolic Panel: Recent Labs  Lab 07/09/23 1001 07/10/23 0643 07/11/23 0639  NA 141 140  --   K 4.2 5.1  --   CL 89* 90*  --   CO2 42* 43*  --   GLUCOSE 128* 117*  --   BUN 15 17  --   CREATININE 0.99 0.99  --  CALCIUM  9.4 9.0  --   MG  --   --  2.4  PHOS  --   --  2.3*   GFR: CrCl cannot be calculated (Unknown ideal weight.). Liver Function Tests: Recent Labs  Lab 07/10/23 0643  AST 22  ALT 16  ALKPHOS 43  BILITOT 0.5  PROT 5.8*  ALBUMIN  3.1*   BNP (last 3 results) Recent Labs    08/14/22 0617 02/20/23 1617 07/11/23 0639  BNP 143.2* 86.0 221.1*   Sepsis Labs: Recent Labs  Lab 07/09/23 1001 07/09/23 1249 07/11/23 0639  PROCALCITON  --   --  <0.10  LATICACIDVEN 1.9 2.2*  --     Recent Results (from the past 240 hours)  Resp panel by RT-PCR (RSV, Flu A&B, Covid)     Status: None   Collection Time: 07/09/23 10:01 AM   Specimen: Nasal Swab  Result Value Ref Range Status   SARS Coronavirus 2 by RT PCR NEGATIVE NEGATIVE Final   Influenza A by PCR NEGATIVE NEGATIVE Final   Influenza B by PCR NEGATIVE NEGATIVE Final    Comment: (NOTE) The Xpert Xpress SARS-CoV-2/FLU/RSV plus assay is intended as an aid in the diagnosis of influenza from Nasopharyngeal swab specimens and should not be used as a sole basis for  treatment. Nasal washings and aspirates are unacceptable for Xpert Xpress SARS-CoV-2/FLU/RSV testing.  Fact Sheet for Patients: bloggercourse.com  Fact Sheet for Healthcare Providers: seriousbroker.it  This test is not yet approved or cleared by the [place]  FDA and has been authorized for detection and/or diagnosis of SARS-CoV-2 by FDA under an Emergency Use Authorization (EUA). This EUA will remain in effect (meaning this test can be used) for the duration of the COVID-19 declaration under Section 564(b)(1) of the Act, 21 U.S.C. section 360bbb-3(b)(1), unless the authorization is terminated or revoked.     Resp Syncytial Virus by PCR NEGATIVE NEGATIVE Final    Comment: (NOTE) Fact Sheet for Patients: bloggercourse.com  Fact Sheet for Healthcare Providers: seriousbroker.it  This test is not yet approved or cleared by the [place]  FDA and has been authorized for detection and/or diagnosis of SARS-CoV-2 by FDA under an Emergency Use Authorization (EUA). This EUA will remain in effect (meaning this test can be used) for the duration of the COVID-19 declaration under Section 564(b)(1) of the Act, 21 U.S.C. section 360bbb-3(b)(1), unless the authorization is terminated or revoked.  Performed at [facility], [address], [place], [place] [address]   Culture, blood (routine x 2)     Status: None (Preliminary result)   Collection Time: 07/09/23 10:03 AM   Specimen: BLOOD RIGHT WRIST  Result Value Ref Range Status   Specimen Description BLOOD RIGHT WRIST  Final   Special Requests   Final    BOTTLES DRAWN AEROBIC AND ANAEROBIC Blood Culture results may not be optimal due to an inadequate volume of blood received in culture bottles   Culture   Final    NO GROWTH 2 DAYS Performed at [facility], [address], [place], [place] [address]    Report Status  PENDING  Incomplete  Culture, blood (routine x 2)     Status: None (Preliminary result)   Collection Time: 07/09/23 10:25 AM   Specimen: BLOOD RIGHT HAND  Result Value Ref Range Status   Specimen Description BLOOD RIGHT HAND  Final   Special Requests   Final    BOTTLES DRAWN AEROBIC ONLY Blood Culture results may not be optimal due  to an inadequate volume of blood received in culture bottles   Culture   Final    NO GROWTH 2 DAYS Performed at [facility], [address], [place], [place] [address]    Report Status PENDING  Incomplete  Respiratory (~20 pathogens) panel by PCR     Status: None   Collection Time: 07/09/23  3:03 PM   Specimen: Nasopharyngeal Swab; Respiratory  Result Value Ref Range Status   Adenovirus NOT DETECTED NOT DETECTED Final   Coronavirus 229E NOT DETECTED NOT DETECTED Final    Comment: (NOTE) The Coronavirus on the Respiratory Panel, DOES NOT test for the novel  Coronavirus (2019 nCoV)    Coronavirus HKU1 NOT DETECTED NOT DETECTED Final   Coronavirus NL63 NOT DETECTED NOT DETECTED Final   Coronavirus OC43 NOT DETECTED NOT DETECTED Final   Metapneumovirus NOT DETECTED NOT DETECTED Final   Rhinovirus / Enterovirus NOT DETECTED NOT DETECTED Final   Influenza A NOT DETECTED NOT DETECTED Final   Influenza B NOT DETECTED NOT DETECTED Final   Parainfluenza Virus 1 NOT DETECTED NOT DETECTED Final   Parainfluenza Virus 2 NOT DETECTED NOT DETECTED Final   Parainfluenza Virus 3 NOT DETECTED NOT DETECTED Final   Parainfluenza Virus 4 NOT DETECTED NOT DETECTED Final   Respiratory Syncytial Virus NOT DETECTED NOT DETECTED Final   Bordetella pertussis NOT DETECTED NOT DETECTED Final   Bordetella Parapertussis NOT DETECTED NOT DETECTED Final   Chlamydophila pneumoniae NOT DETECTED NOT DETECTED Final   Mycoplasma pneumoniae NOT DETECTED NOT DETECTED Final    Comment: Performed at [facility], [address], [place], [place] [address]  MRSA Next Gen by PCR,  Nasal     Status: None   Collection Time: 07/10/23  3:26 PM   Specimen: Nasal Mucosa; Nasal Swab  Result Value Ref Range Status   MRSA by PCR Next Gen NOT DETECTED NOT DETECTED Final    Comment: (NOTE) The GeneXpert MRSA Assay (FDA approved for NASAL specimens only), is one component of a comprehensive MRSA colonization surveillance program. It is not intended to diagnose MRSA infection nor to guide or monitor treatment for MRSA infections. Test performance is not FDA approved in patients less than 44 years old. Performed at [facility], [address] [place], [place] [address]      Radiology Studies: No results found.  Scheduled Meds:  apixaban   2.5 mg Oral BID   Chlorhexidine  Gluconate Cloth  6 each Topical Q0600   ipratropium-albuterol   3 mL Nebulization Q4H   methylPREDNISolone  (SOLU-MEDROL ) injection  80 mg Intravenous Q12H   mupirocin  ointment  1 Application Nasal BID   pantoprazole   20 mg Oral Daily   polyvinyl alcohol   1 drop Both Eyes QODAY   roflumilast   500 mcg Oral Daily   rosuvastatin   20 mg Oral QPM   sodium chloride  flush  3 mL Intravenous Q12H   sodium chloride  HYPERTONIC  4 mL Nebulization BID   Continuous Infusions:  azithromycin      cefTAZidime  (FORTAZ )  IV 1 g (07/11/23 0546)   vancomycin        LOS: 0 days   Time spent: 40 minutes  Camellia Door, DO  [facility]  07/11/2023, 10:30 AM

## 2023-07-12 ENCOUNTER — Inpatient Hospital Stay (HOSPITAL_COMMUNITY): Payer: Medicare Other

## 2023-07-12 ENCOUNTER — Telehealth: Payer: Self-pay | Admitting: Internal Medicine

## 2023-07-12 DIAGNOSIS — I2782 Chronic pulmonary embolism: Secondary | ICD-10-CM | POA: Diagnosis not present

## 2023-07-12 DIAGNOSIS — J9622 Acute and chronic respiratory failure with hypercapnia: Secondary | ICD-10-CM | POA: Diagnosis not present

## 2023-07-12 DIAGNOSIS — I10 Essential (primary) hypertension: Secondary | ICD-10-CM

## 2023-07-12 DIAGNOSIS — I5022 Chronic systolic (congestive) heart failure: Secondary | ICD-10-CM | POA: Diagnosis not present

## 2023-07-12 DIAGNOSIS — J9621 Acute and chronic respiratory failure with hypoxia: Secondary | ICD-10-CM | POA: Diagnosis not present

## 2023-07-12 DIAGNOSIS — J441 Chronic obstructive pulmonary disease with (acute) exacerbation: Secondary | ICD-10-CM | POA: Diagnosis not present

## 2023-07-12 LAB — CBC WITH DIFFERENTIAL/PLATELET
Abs Immature Granulocytes: 0.03 10*3/uL (ref 0.00–0.07)
Basophils Absolute: 0 10*3/uL (ref 0.0–0.1)
Basophils Relative: 0 %
Eosinophils Absolute: 0 10*3/uL (ref 0.0–0.5)
Eosinophils Relative: 0 %
HCT: 25.6 % — ABNORMAL LOW (ref 39.0–52.0)
Hemoglobin: 7.9 g/dL — ABNORMAL LOW (ref 13.0–17.0)
Immature Granulocytes: 1 %
Lymphocytes Relative: 2 %
Lymphs Abs: 0.1 10*3/uL — ABNORMAL LOW (ref 0.7–4.0)
MCH: 29.7 pg (ref 26.0–34.0)
MCHC: 30.9 g/dL (ref 30.0–36.0)
MCV: 96.2 fL (ref 80.0–100.0)
Monocytes Absolute: 0.1 10*3/uL (ref 0.1–1.0)
Monocytes Relative: 3 %
Neutro Abs: 4.1 10*3/uL (ref 1.7–7.7)
Neutrophils Relative %: 94 %
Platelets: 122 10*3/uL — ABNORMAL LOW (ref 150–400)
RBC: 2.66 MIL/uL — ABNORMAL LOW (ref 4.22–5.81)
RDW: 12.9 % (ref 11.5–15.5)
WBC: 4.3 10*3/uL (ref 4.0–10.5)
nRBC: 0 % (ref 0.0–0.2)

## 2023-07-12 LAB — BLOOD GAS, ARTERIAL
Acid-Base Excess: 14.5 mmol/L — ABNORMAL HIGH (ref 0.0–2.0)
Bicarbonate: 42.7 mmol/L — ABNORMAL HIGH (ref 20.0–28.0)
O2 Saturation: 98.6 %
Patient temperature: 36.8
pCO2 arterial: 68 mm[Hg] (ref 32–48)
pH, Arterial: 7.4 (ref 7.35–7.45)
pO2, Arterial: 80 mm[Hg] — ABNORMAL LOW (ref 83–108)

## 2023-07-12 LAB — COMPREHENSIVE METABOLIC PANEL
ALT: 17 U/L (ref 0–44)
AST: 26 U/L (ref 15–41)
Albumin: 2.8 g/dL — ABNORMAL LOW (ref 3.5–5.0)
Alkaline Phosphatase: 38 U/L (ref 38–126)
Anion gap: 11 (ref 5–15)
BUN: 22 mg/dL (ref 8–23)
CO2: 36 mmol/L — ABNORMAL HIGH (ref 22–32)
Calcium: 8.1 mg/dL — ABNORMAL LOW (ref 8.9–10.3)
Chloride: 92 mmol/L — ABNORMAL LOW (ref 98–111)
Creatinine, Ser: 0.92 mg/dL (ref 0.61–1.24)
GFR, Estimated: >60 mL/min (ref >60)
Glucose, Bld: 121 mg/dL — ABNORMAL HIGH (ref 70–99)
Potassium: 4.4 mmol/L (ref 3.5–5.1)
Sodium: 139 mmol/L (ref 135–145)
Total Bilirubin: 0.6 mg/dL (ref 0.0–1.2)
Total Protein: 5.2 g/dL — ABNORMAL LOW (ref 6.5–8.1)

## 2023-07-12 LAB — EXPECTORATED SPUTUM ASSESSMENT W GRAM STAIN, RFLX TO RESP C

## 2023-07-12 LAB — MAGNESIUM: Magnesium: 2.3 mg/dL (ref 1.7–2.4)

## 2023-07-12 LAB — STREP PNEUMONIAE URINARY ANTIGEN: Strep Pneumo Urinary Antigen: NEGATIVE

## 2023-07-12 MED ORDER — DILTIAZEM HCL 25 MG/5ML IV SOLN: 5.0000 mg | INTRAVENOUS | Status: DC | PRN

## 2023-07-12 MED ORDER — DILTIAZEM HCL 30 MG PO TABS: 30.0000 mg | ORAL_TABLET | ORAL | Status: DC | PRN

## 2023-07-12 MED ORDER — IOHEXOL 350 MG/ML SOLN
75.0000 mL | Freq: Once | INTRAVENOUS | Status: AC | PRN
  Administered 2023-07-12: 75 mL via INTRAVENOUS

## 2023-07-12 MED ORDER — IPRATROPIUM-ALBUTEROL 0.5-2.5 (3) MG/3ML IN SOLN: 3.0000 mL | Freq: Three times a day (TID) | RESPIRATORY_TRACT | Status: DC

## 2023-07-12 MED ORDER — AZITHROMYCIN 250 MG PO TABS
250.0000 mg | ORAL_TABLET | ORAL | Status: DC
  Administered 2023-07-14: 250 mg via ORAL
  Filled 2023-07-12: qty 1

## 2023-07-12 MED ORDER — IVABRADINE HCL 5 MG PO TABS: 5.0000 mg | ORAL_TABLET | Freq: Two times a day (BID) | ORAL | Status: DC

## 2023-07-12 MED ORDER — IPRATROPIUM-ALBUTEROL 0.5-2.5 (3) MG/3ML IN SOLN
3.0000 mL | Freq: Three times a day (TID) | RESPIRATORY_TRACT | Status: DC
  Administered 2023-07-12 – 2023-07-14 (×7): 3 mL via RESPIRATORY_TRACT
  Filled 2023-07-12 (×7): qty 3

## 2023-07-12 MED ORDER — HYDROXYZINE HCL 25 MG PO TABS
25.0000 mg | ORAL_TABLET | Freq: Four times a day (QID) | ORAL | Status: DC | PRN
  Administered 2023-07-13: 25 mg via ORAL
  Filled 2023-07-12 (×2): qty 1

## 2023-07-12 MED ORDER — CIPROFLOXACIN HCL 750 MG PO TABS
750.0000 mg | ORAL_TABLET | Freq: Two times a day (BID) | ORAL | Status: DC
  Administered 2023-07-12 – 2023-07-14 (×4): 750 mg via ORAL
  Filled 2023-07-12 (×4): qty 1

## 2023-07-12 MED ORDER — PREDNISONE 20 MG PO TABS
40.0000 mg | ORAL_TABLET | Freq: Every day | ORAL | Status: DC
  Administered 2023-07-12 – 2023-07-14 (×3): 40 mg via ORAL
  Filled 2023-07-12 (×3): qty 2

## 2023-07-12 NOTE — Progress Notes (Signed)
 Pt c/o IV bothering him. Removed IV with Dr. Kern. Upon removing IV Right Upper Arm would was oozing blood. Consult put in by Dr. Laurence. This nurse unwrapped wound down to Xeroform dressing, removed some using Saline to loosen it, as to not tear anymore skin. No active site of bleeding. Reinforced with Xeroform Dressing with reinforcement of 4x4 and Curlex wrap around arm. No further bleeding noticed at this time. MD aware. Francisco [name] Francisco [name] 07/12/23 4:31 PM

## 2023-07-12 NOTE — Telephone Encounter (Signed)
 Front desk   [name]  - needs post hospital followup with MR/APP ASAP < 2 weeks ideally. If not 4 weeks  Thanks    SIGNATURE    Dr. Dorethia Cave, M.D., F.C.C.P,  Pulmonary and Critical Care Medicine Staff Physician, [facility] Director - Interstitial Lung Disease  Program  Pulmonary Fibrosis [facility] [place], [place], [address]   Pager: [phone], If no answer  -> Check AMION or Try [phone] Telephone (clinical office): [phone] Telephone (research): [phone]  2:37 PM 07/12/2023

## 2023-07-12 NOTE — Progress Notes (Signed)
   Discussed with PCCM. Will stop IV abx. Change to po cipro  750 mg bid due to prior hx of pseudomonas. Change back to zithromax  250 mg M, W, F. Start Corlanor for his tachycardia. Would avoid all betablockers in him due to his severe COPD Decrease duonebs to TID(instead of q4h).  Awaiting approval for NIV. He really needs this before he can go home.  Camellia Door, DO [facility]

## 2023-07-12 NOTE — Progress Notes (Addendum)
 Date and time results received: 07/12/23 0642   Test: Venous blood gas Critical Value: Pco2 68   Name of Provider Notified: Dr. Sheliah Hatch    Orders Received? Or Actions Taken?:  No new orders received.

## 2023-07-12 NOTE — Progress Notes (Signed)
 PROGRESS NOTE    Francisco [name]  FMW:[id number] DOB: [birth year] DOA: 07/09/2023 PCP: [name], MD  Subjective: Pt seen and examined.  Met with pt's son Francisco [name] and pt's wife at bedside ABG on NIV this AM shows pH 7.4 with PCO2 of 68  No wheezing on exam. Have started process for NIV machine with TOC. Home health was waiting for ABG on NIV to document hypercapnia.  No fevers.   Hospital Course: HPI: Francisco [name] is a 78 y.o. male with medical history significant of hypertension, hyperlipidemia, chronic CHF, BPH, PAD, anemia, GI AVM, compression fracture, essential tremor, chronic respiratory failure with hypoxia, severe COPD, pulmonary embolism, small cell lung cancer presenting with worsening shortness of breath and cough.   Patient has had a couple weeks of cough which worsened today and associated significant shortness of breath today.  Had been evaluated by pulmonology by telephone visit and been prescribed prednisone  taper and ciprofloxacin .  Which she has started.  Is chronically on 10 mg prednisone  daily.   Today with worsening systems he took his 40 p.o. prednisone  prescribed by pulmonologist and 4 puffs of albuterol  without improvement.  Received nebulizer and route with EMS with some improvement.  Had been desaturating as low as the 70s on home 4 L which she typically takes with exertion.   Denies fevers, chills, chest pain, abdominal pain, constipation, diarrhea, nausea, vomiting.  Significant Events: Admitted 07/09/2023 for COPD exacerbation, acute on chronic respiratory failure with hypoxia and hypercapnia   Significant Labs: chloride 89, bicarb stable 42. CBC with hemoglobin stable at 9.2. Lactic acid borderline at 1.9 and then 2.2 on repeat. Respiratory panel for flu COVID RSV negative.   Significant Imaging Studies: Chest x-ray with stable chronic changes and no acute abnormality.   Antibiotic Therapy: Anti-infectives (From admission, onward)    Start      Dose/Rate Route Frequency Ordered Stop   07/10/23 1000  azithromycin  (ZITHROMAX ) tablet 500 mg        500 mg Oral Daily 07/09/23 1503     07/09/23 1430  azithromycin  (ZITHROMAX ) 500 mg in sodium chloride  0.9 % 250 mL IVPB        500 mg 250 mL/hr over 60 Minutes Intravenous  Once 07/09/23 1423 07/09/23 1531       Procedures:   Consultants:     Assessment and Plan: * Acute on chronic respiratory failure with hypoxia and hypercapnia (HCC) 07-10-2023 VBG obtained this AM. pH 7.3, PCO2 99, bicarb 49. Has both acute and chronic respiratory acidosis with metabolic alkalosis.  PCCM consulted. May need NIV at home. Discussed at length with pt's wife and dtr. Placed on NIV per PCCM. Keep NPO for now.  07-11-2023 respiratory status stable enough this AM to start diet.  Continue with NIV during sleep.  Baseline PCO2 appears to be in the low 70s.  pH this AM 7.42  07-12-2023 ABG on NIV this AM shows pH 7.4 with PCO2 of 68. TOC working on obtaining NIV for home use.  COPD with acute exacerbation (HCC) 07-10-2023 defer management to PCCM. He was placed on IV steroids, IV ceftaz and IV vanco. On NIV. PCCM wrote to change to IV steroids. Pt received 125 mg IV solumedrol yesterday AM. Will continue with 80 mg q12h IV solumedrol. Continue with NIV. Will ask CM to look into home NIV for him. 07/12/2023 no wheezing. Change to po prednisone . PCCM to manage IV Abx. Would advocate in stopping abx.  Pseudomonas aeruginosa colonization -  pulmonary 07/10/2023 now on IV Ceftaz and vanco per PCCM. 07/11/2023 continue IV ceftax and vanco per [facility] 07/12/2023 defer abx management to PCCM. WBC 4.3, no fevers. Checking CT chest.  Chronic systolic congestive heart failure (HCC) 07/10/2023 stable. Holding lasix  for now as this could contribute to alkalosis. 07-11-2023 stable. Repeat BMP in AM. 07/12/2023 his metabolic alkalosis is improving with NIV treatment.  Anemia 07/10/2023 stable. 07-11-2023  stable 07-12-2023 HgB 7.9 today.  Was 9.2 3 days ago. Had skin tear on left arm on admission. Wound care consult.  H/O arteriovenous malformation (AVM) 07/10/2023 stable. No evidence of bleeding. 07-11-2023 stable 07-12-2023 stable   Malignant neoplasm of lower lobe of right lung (HCC) 07/10/2023 stable. 07-11-2023 stable 07-12-2023 pt was scheduled for CT chest with IV contrast as followup. Will check CT chest with IV contrast per family request.  PAD (peripheral artery disease) (HCC) 07/10/2023 remains on crestor  and eliquis . 07-11-2023 stable 07-12-2023 stable   Chronic pulmonary embolism (HCC) 07/10/2023 continue with Eliquis  2.5 mg bid. 07-11-2023 stable 07-12-2023 stable   Essential hypertension 07/10/2023 stable 07-11-2023 stable 07-12-2023 stable   BPH associated with nocturia 07/10/2023 chronic 07-11-2023 stable 07-12-2023 stable   Hyperlipidemia 07/10/2023 continue crestor  20 mg qpm. 07-11-2023 stable 07-12-2023 stable   Skin tear of elbow without complication, left, initial encounter 07/10/2023 present on admission. Will get wound care consult.  DNR (do not resuscitate) 07/10/2023 PCCM discussed with pt, wife and dtr. Pt was made DNR/DNI.  Chronic respiratory failure with hypoxia and hypercapnia (HCC) - baseline PCO2 in the 70s 07/10/2023 evidence of chronic CO2 retention with VBG showing pH of 7.3 and PCO2 99.  Pt may need NIV at home/discharge. 07/11/2023 baseline PCO2 in the low 70s. This AM pH 7.42. CM looking into home NIV. 07/12/2023 ABG on NIV this AM. pH 7.4, PCO2 of 68. TOC working on home NIV.   DVT prophylaxis: apixaban  (ELIQUIS ) tablet 2.5 mg Start: 07/09/23 2200 apixaban  (ELIQUIS ) tablet 2.5 mg     Code Status: Limited: Do not attempt resuscitation (DNR) -DNR-LIMITED -Do Not Intubate/DNI  Family Communication: discussed with pt, wife and son at bedside Disposition Plan: return home Reason for continuing need for hospitalization: awaiting  home NIV machine. Remains on IV ABX. Check CT chest today.  Objective: Vitals:   07/12/23 0300 07/12/23 0808 07/12/23 0829 07/12/23 1115  BP: (!) 158/95 (!) 140/42 (!) 140/42 (!) 140/42  Pulse: 90  (!) 109 (!) 110  Resp: 17 16 20 19   Temp: 98.3 F (36.8 C) 97.8 F (36.6 C)    TempSrc: Oral Oral    SpO2: 98% 94% 100% 98%    Intake/Output Summary (Last 24 hours) at 07/12/2023 1136 Last data filed at 07/12/2023 0501 Gross per 24 hour  Intake --  Output 600 ml  Net -600 ml   There were no vitals filed for this visit.  Examination:  Physical Exam Vitals reviewed.  Constitutional:      Comments: Much more awake and alert  HENT:     Head: Normocephalic and atraumatic.  Eyes:     General: No scleral icterus. Cardiovascular:     Rate and Rhythm: Regular rhythm. Tachycardia present.  Pulmonary:     Effort: Pulmonary effort is normal. No respiratory distress.     Breath sounds: Normal breath sounds.  Abdominal:     General: Bowel sounds are normal.     Palpations: Abdomen is soft.  Skin:    Comments: Left elbow in bulky gauze dressing  Neurological:  General: No focal deficit present.     Mental Status: He is alert.     Comments: Confused at times.     Data Reviewed: I have personally reviewed following labs and imaging studies  CBC: Recent Labs  Lab 07/09/23 1001 07/10/23 0643 07/12/23 0425  WBC 10.4 3.6* 4.3  NEUTROABS  --   --  4.1  HGB 9.2* 8.5* 7.9*  HCT 31.0* 28.8* 25.6*  MCV 100.6* 100.0 96.2  PLT 167 141* 122*   Basic Metabolic Panel: Recent Labs  Lab 07/09/23 1001 07/10/23 0643 07/11/23 0639 07/12/23 0425  NA 141 140  --  139  K 4.2 5.1  --  4.4  CL 89* 90*  --  92*  CO2 42* 43*  --  36*  GLUCOSE 128* 117*  --  121*  BUN 15 17  --  22  CREATININE 0.99 0.99  --  0.92  CALCIUM  9.4 9.0  --  8.1*  MG  --   --  2.4 2.3  PHOS  --   --  2.3*  --    GFR: CrCl cannot be calculated (Unknown ideal weight.). Liver Function Tests: Recent Labs   Lab 07/10/23 0643 07/12/23 0425  AST 22 26  ALT 16 17  ALKPHOS 43 38  BILITOT 0.5 0.6  PROT 5.8* 5.2*  ALBUMIN  3.1* 2.8*   BNP (last 3 results) Recent Labs    08/14/22 0617 02/20/23 1617 07/11/23 0639  BNP 143.2* 86.0 221.1*   Sepsis Labs: Recent Labs  Lab 07/09/23 1001 07/09/23 1249 07/11/23 0639  PROCALCITON  --   --  <0.10  LATICACIDVEN 1.9 2.2*  --     Recent Results (from the past 240 hours)  Resp panel by RT-PCR (RSV, Flu A&B, Covid)     Status: None   Collection Time: 07/09/23 10:01 AM   Specimen: Nasal Swab  Result Value Ref Range Status   SARS Coronavirus 2 by RT PCR NEGATIVE NEGATIVE Final   Influenza A by PCR NEGATIVE NEGATIVE Final   Influenza B by PCR NEGATIVE NEGATIVE Final    Comment: (NOTE) The Xpert Xpress SARS-CoV-2/FLU/RSV plus assay is intended as an aid in the diagnosis of influenza from Nasopharyngeal swab specimens and should not be used as a sole basis for treatment. Nasal washings and aspirates are unacceptable for Xpert Xpress SARS-CoV-2/FLU/RSV testing.  Fact Sheet for Patients: bloggercourse.com  Fact Sheet for Healthcare Providers: seriousbroker.it  This test is not yet approved or cleared by the [place]  FDA and has been authorized for detection and/or diagnosis of SARS-CoV-2 by FDA under an Emergency Use Authorization (EUA). This EUA will remain in effect (meaning this test can be used) for the duration of the COVID-19 declaration under Section 564(b)(1) of the Act, 21 U.S.C. section 360bbb-3(b)(1), unless the authorization is terminated or revoked.     Resp Syncytial Virus by PCR NEGATIVE NEGATIVE Final    Comment: (NOTE) Fact Sheet for Patients: bloggercourse.com  Fact Sheet for Healthcare Providers: seriousbroker.it  This test is not yet approved or cleared by the [place]  FDA and has been authorized for  detection and/or diagnosis of SARS-CoV-2 by FDA under an Emergency Use Authorization (EUA). This EUA will remain in effect (meaning this test can be used) for the duration of the COVID-19 declaration under Section 564(b)(1) of the Act, 21 U.S.C. section 360bbb-3(b)(1), unless the authorization is terminated or revoked.  Performed at [facility], [address], [place], [place] [address]   Culture, blood (  routine x 2)     Status: None (Preliminary result)   Collection Time: 07/09/23 10:03 AM   Specimen: BLOOD RIGHT WRIST  Result Value Ref Range Status   Specimen Description BLOOD RIGHT WRIST  Final   Special Requests   Final    BOTTLES DRAWN AEROBIC AND ANAEROBIC Blood Culture results may not be optimal due to an inadequate volume of blood received in culture bottles   Culture   Final    NO GROWTH 3 DAYS Performed at [facility], [address] [place], [place] [address]    Report Status PENDING  Incomplete  Culture, blood (routine x 2)     Status: None (Preliminary result)   Collection Time: 07/09/23 10:25 AM   Specimen: BLOOD RIGHT HAND  Result Value Ref Range Status   Specimen Description BLOOD RIGHT HAND  Final   Special Requests   Final    BOTTLES DRAWN AEROBIC ONLY Blood Culture results may not be optimal due to an inadequate volume of blood received in culture bottles   Culture   Final    NO GROWTH 3 DAYS Performed at [facility], [address], [place], [place] [address]    Report Status PENDING  Incomplete  Respiratory (~20 pathogens) panel by PCR     Status: None   Collection Time: 07/09/23  3:03 PM   Specimen: Nasopharyngeal Swab; Respiratory  Result Value Ref Range Status   Adenovirus NOT DETECTED NOT DETECTED Final   Coronavirus 229E NOT DETECTED NOT DETECTED Final    Comment: (NOTE) The Coronavirus on the Respiratory Panel, DOES NOT test for the novel  Coronavirus (2019 nCoV)    Coronavirus HKU1 NOT DETECTED NOT DETECTED Final    Coronavirus NL63 NOT DETECTED NOT DETECTED Final   Coronavirus OC43 NOT DETECTED NOT DETECTED Final   Metapneumovirus NOT DETECTED NOT DETECTED Final   Rhinovirus / Enterovirus NOT DETECTED NOT DETECTED Final   Influenza A NOT DETECTED NOT DETECTED Final   Influenza B NOT DETECTED NOT DETECTED Final   Parainfluenza Virus 1 NOT DETECTED NOT DETECTED Final   Parainfluenza Virus 2 NOT DETECTED NOT DETECTED Final   Parainfluenza Virus 3 NOT DETECTED NOT DETECTED Final   Parainfluenza Virus 4 NOT DETECTED NOT DETECTED Final   Respiratory Syncytial Virus NOT DETECTED NOT DETECTED Final   Bordetella pertussis NOT DETECTED NOT DETECTED Final   Bordetella Parapertussis NOT DETECTED NOT DETECTED Final   Chlamydophila pneumoniae NOT DETECTED NOT DETECTED Final   Mycoplasma pneumoniae NOT DETECTED NOT DETECTED Final    Comment: Performed at [facility], [address] [place], [place] [address]  MRSA Next Gen by PCR, Nasal     Status: None   Collection Time: 07/10/23  3:26 PM   Specimen: Nasal Mucosa; Nasal Swab  Result Value Ref Range Status   MRSA by PCR Next Gen NOT DETECTED NOT DETECTED Final    Comment: (NOTE) The GeneXpert MRSA Assay (FDA approved for NASAL specimens only), is one component of a comprehensive MRSA colonization surveillance program. It is not intended to diagnose MRSA infection nor to guide or monitor treatment for MRSA infections. Test performance is not FDA approved in patients less than 39 years old. Performed at [facility], [address], [place], [place] [address]      Radiology Studies: No results found.  Scheduled Meds:  apixaban   2.5 mg Oral BID   Chlorhexidine  Gluconate Cloth  6 each Topical Q0600   ipratropium-albuterol   3 mL Nebulization Q4H   mupirocin  ointment  1 Application Nasal BID   pantoprazole   20 mg Oral Daily   polyvinyl alcohol   1 drop Both Eyes QODAY   predniSONE   40 mg Oral Q breakfast   roflumilast   500 mcg Oral Daily    rosuvastatin   20 mg Oral QPM   sodium chloride  flush  3 mL Intravenous Q12H   sodium chloride  HYPERTONIC  4 mL Nebulization BID   Continuous Infusions:  azithromycin  250 mg (07/11/23 1618)   cefTAZidime  (FORTAZ )  IV 1 g (07/12/23 0500)   vancomycin  1,500 mg (07/11/23 1742)     LOS: 1 day   Time spent: 40 minutes  Camellia Door, DO  [facility]  07/12/2023, 11:36 AM

## 2023-07-12 NOTE — Progress Notes (Signed)
 RT instructed patient and family on the flutter valve. Patient able to demonstrate back good technique with a productive cough.

## 2023-07-12 NOTE — Progress Notes (Signed)
 NAME:  Francisco [name], MRN:  [id number], DOB:  1945-11-16, LOS: 1 ADMISSION DATE:  07/09/2023, CONSULTATION DATE:  07/10/23 REFERRING MD:  Dr Camellia Door, CHIEF COMPLAINT:  Acute on chronic respiratory failure    History of Present Illness:   78 year old male with advanced COPD well-known to Dr. Geronimo from the clinic.  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  Has had multiple admissions to the hospital and is on chronic prednisone , oxygen.  He also has high eosinophils but it declined Dupixent  to prevent COPD exacerbations.  He remains on Roflumilast  bronchodilator therapy and recently started on the new bronchodilator OTHUVARYUE  Seen in the office mid November 2024 and at the time sputum culture was ordered which subsequently showed Pseudomonas.  He then called the office 06/18/2023 with COPD exacerbation symptoms and then on 06/20/2023 was given Cipro  5 mg twice daily for 7 days and also short course prednisone  but then he called back 07/07/2023 with worsening symptoms and hypoxemia.  Was advised hospitalization.  He then ended up in the hospital 1//1/25 with acute exacerbation COPD and admitted by the hospitalist service  On 07/10/2023 pulmonary medicine consult called.  Just prior to arrival of the pulmonary services he complained of worsening shortness of breath and also had his visualization that he was going to die and requested comfort care.  Upon CCM MD history in the presence of wife and daughter he indicated that he was partially afraid to die because he felt he was not getting adequate care and frequent visits by the physicians but he also said he was ready to die and he felt that his disease had come to an end stage.  He also felt he was a burden on the family.  Goals of care conversation held with him, bedside nurse and family: Resolved to be no intubation no  CPR but treat with BiPAP and IV antibiotics and also concurrent comfort for air hunger.   Past Medical History:    has a past medical history of Acute GI bleeding (02/18/2020), Chronic back pain, Chronic rhinitis, Compressed spine fracture (HCC), COPD (chronic obstructive pulmonary disease) (HCC), Dyspnea, Emphysema lung (HCC), Hyperglycemia (06/12/2016), Iron  deficiency anemia due to chronic blood loss (08/23/2020), Left ankle swelling (07/22/2014), Leukocytosis (12/19/2016), Melena (08/11/2022), On home oxygen therapy, Onychomycosis, Orthostatic hypotension, PAD (peripheral artery disease) (HCC), Pneumonia, Pulmonary embolism (HCC) (04/07/2018), Skin cancer, Small cell lung cancer, right (HCC) (2023), and Vertigo.   reports that he quit smoking about 5 years ago. His smoking use included pipe and cigarettes. He started smoking about 57 years ago. He has a 104 pack-year smoking history. He has never been exposed to tobacco smoke. He has never used smokeless tobacco.  Significant Hospital Events:  07/09/2023 - admit 1/4 no acute issues overnight, reports use of BiPAP.  ABG a.m. with CO2 68  Interim History / Subjective:  Seen sitting up in bed in no acute distress.  Reports compliance with BiPAP during admission and states he thinks that he can be compliant with NIV upon discharge.  Son at bedside and updated regarding plan  Objective   Blood pressure (!) 140/42, pulse (!) 109, temperature 97.8 F (36.6 C), temperature source Oral, resp. rate 20, SpO2 100%.    FiO2 (%):  [40 %] 40 % PEEP:  [5 cmH20] 5 cmH20   Intake/Output Summary (Last 24 hours) at 07/12/2023 1058 Last data filed at 07/12/2023 0501 Gross per  24 hour  Intake --  Output 600 ml  Net -600 ml   There were no vitals filed for this visit.  Examination: General: Acute on chronic ill-appearing elderly male sitting up in bed in no acute distress HEENT: Colon/AT, MM pink/moist, PERRL,  Neuro: Alert and oriented x 3, nonfocal,  globally weak CV: s1s2 regular rate and rhythm, no murmur, rubs, or gallops,  PULM: Faint bilateral wheeze, no increased work of breathing, on 4 L nasal cannula GI: soft, bowel sounds active in all 4 quadrants, non-tender, non-distended, tolerating oral diet Extremities: warm/dry, no edema  Skin: no rashes or lesions   Resolved Hospital Problem list   X  Assessment & Plan:   Acute on chronic hypoxic respiratory failure New onset hypercapnic respiratory failure Acute COPD exasperation, POA -Failed outpatient therapy Chronic colonization of Pseudomonas History of advanced COPD managed with chronic steroids at baseline History of pulmonary embolism in the past currently on DOAC P:   Continue IV ceftazidime , azithromycin , and vancomycin  Blood cultures negative to date Check sputum culture if able to produce a sample Continue every 4 DuoNebs Continue p.o. prednisone  Continue Roflumilast  Overlies as able Encourage pulmonary hygiene Patient would benefit from home NIV for management of hypercapnia in the setting of advanced COPD we (of arranging this  Best practice (daily eval):  Per primary  SIGNATURE  See Beharry D. Harris, NP-C [facility] Personal contact information can be found on Amion  If no contact or response made please call [phone] 07/12/2023, 11:02 AM

## 2023-07-12 NOTE — Plan of Care (Signed)
  Problem: Education: Goal: Knowledge of disease or condition will improve Outcome: Not Progressing Goal: Knowledge of the prescribed therapeutic regimen will improve Outcome: Not Progressing Goal: Individualized Educational Video(s) Outcome: Not Progressing   Problem: Activity: Goal: Ability to tolerate increased activity will improve Outcome: Not Progressing Goal: Will verbalize the importance of balancing activity with adequate rest periods Outcome: Not Progressing   Problem: Respiratory: Goal: Ability to maintain a clear airway will improve Outcome: Not Progressing Goal: Levels of oxygenation will improve Outcome: Not Progressing Goal: Ability to maintain adequate ventilation will improve Outcome: Not Progressing   Problem: Education: Goal: Knowledge of General Education information will improve Description: Including pain rating scale, medication(s)/side effects and non-pharmacologic comfort measures Outcome: Not Progressing   Problem: Health Behavior/Discharge Planning: Goal: Ability to manage health-related needs will improve Outcome: Not Progressing

## 2023-07-13 DIAGNOSIS — D62 Acute posthemorrhagic anemia: Secondary | ICD-10-CM | POA: Diagnosis not present

## 2023-07-13 DIAGNOSIS — J9621 Acute and chronic respiratory failure with hypoxia: Secondary | ICD-10-CM | POA: Diagnosis not present

## 2023-07-13 DIAGNOSIS — I5022 Chronic systolic (congestive) heart failure: Secondary | ICD-10-CM | POA: Diagnosis not present

## 2023-07-13 DIAGNOSIS — J9611 Chronic respiratory failure with hypoxia: Secondary | ICD-10-CM | POA: Diagnosis not present

## 2023-07-13 DIAGNOSIS — J9612 Chronic respiratory failure with hypercapnia: Secondary | ICD-10-CM

## 2023-07-13 LAB — BLOOD GAS, ARTERIAL
Acid-Base Excess: 14.9 mmol/L — ABNORMAL HIGH (ref 0.0–2.0)
Acid-Base Excess: 14.7 mmol/L — ABNORMAL HIGH (ref 0.0–2.0)
Bicarbonate: 44.8 mmol/L — ABNORMAL HIGH (ref 20.0–28.0)
Bicarbonate: 44.8 mmol/L — ABNORMAL HIGH (ref 20.0–28.0)
Drawn by: 44137
Drawn by: 441371
O2 Saturation: 100 %
O2 Saturation: 99.6 %
Patient temperature: 37
Patient temperature: 36.6
pCO2 arterial: 83 mm[Hg] (ref 32–48)
pCO2 arterial: 83 mm[Hg] (ref 32–48)
pH, Arterial: 7.34 — ABNORMAL LOW (ref 7.35–7.45)
pH, Arterial: 7.34 — ABNORMAL LOW (ref 7.35–7.45)
pO2, Arterial: 148 mm[Hg] — ABNORMAL HIGH (ref 83–108)
pO2, Arterial: 111 mm[Hg] — ABNORMAL HIGH (ref 83–108)

## 2023-07-13 LAB — CBC WITH DIFFERENTIAL/PLATELET
Abs Immature Granulocytes: 0.02 10*3/uL (ref 0.00–0.07)
Basophils Absolute: 0 10*3/uL (ref 0.0–0.1)
Basophils Relative: 0 %
Eosinophils Absolute: 0 10*3/uL (ref 0.0–0.5)
Eosinophils Relative: 0 %
HCT: 23.8 % — ABNORMAL LOW (ref 39.0–52.0)
Hemoglobin: 7.4 g/dL — ABNORMAL LOW (ref 13.0–17.0)
Immature Granulocytes: 0 %
Lymphocytes Relative: 8 %
Lymphs Abs: 0.4 10*3/uL — ABNORMAL LOW (ref 0.7–4.0)
MCH: 30.6 pg (ref 26.0–34.0)
MCHC: 31.1 g/dL (ref 30.0–36.0)
MCV: 98.3 fL (ref 80.0–100.0)
Monocytes Absolute: 0.8 10*3/uL (ref 0.1–1.0)
Monocytes Relative: 16 %
Neutro Abs: 4.1 10*3/uL (ref 1.7–7.7)
Neutrophils Relative %: 76 %
Platelets: 108 10*3/uL — ABNORMAL LOW (ref 150–400)
RBC: 2.42 MIL/uL — ABNORMAL LOW (ref 4.22–5.81)
RDW: 12.7 % (ref 11.5–15.5)
WBC: 5.4 10*3/uL (ref 4.0–10.5)
nRBC: 0 % (ref 0.0–0.2)

## 2023-07-13 LAB — EXPECTORATED SPUTUM ASSESSMENT W GRAM STAIN, RFLX TO RESP C

## 2023-07-13 LAB — COMPREHENSIVE METABOLIC PANEL
ALT: 19 U/L (ref 0–44)
AST: 27 U/L (ref 15–41)
Albumin: 2.6 g/dL — ABNORMAL LOW (ref 3.5–5.0)
Alkaline Phosphatase: 35 U/L — ABNORMAL LOW (ref 38–126)
Anion gap: 8 (ref 5–15)
BUN: 20 mg/dL (ref 8–23)
CO2: 39 mmol/L — ABNORMAL HIGH (ref 22–32)
Calcium: 8 mg/dL — ABNORMAL LOW (ref 8.9–10.3)
Chloride: 90 mmol/L — ABNORMAL LOW (ref 98–111)
Creatinine, Ser: 0.98 mg/dL (ref 0.61–1.24)
GFR, Estimated: >60 mL/min (ref >60)
Glucose, Bld: 102 mg/dL — ABNORMAL HIGH (ref 70–99)
Potassium: 4.1 mmol/L (ref 3.5–5.1)
Sodium: 137 mmol/L (ref 135–145)
Total Bilirubin: 0.4 mg/dL (ref 0.0–1.2)
Total Protein: 4.8 g/dL — ABNORMAL LOW (ref 6.5–8.1)

## 2023-07-13 LAB — HEMOGLOBIN AND HEMATOCRIT, BLOOD
HCT: 34.4 % — ABNORMAL LOW (ref 39.0–52.0)
Hemoglobin: 10.8 g/dL — ABNORMAL LOW (ref 13.0–17.0)

## 2023-07-13 LAB — PREPARE RBC (CROSSMATCH)

## 2023-07-13 LAB — MAGNESIUM: Magnesium: 2.2 mg/dL (ref 1.7–2.4)

## 2023-07-13 MED ORDER — FUROSEMIDE 10 MG/ML IJ SOLN
40.0000 mg | Freq: Once | INTRAMUSCULAR | Status: AC | PRN
  Administered 2023-07-13: 40 mg via INTRAVENOUS
  Filled 2023-07-13: qty 4

## 2023-07-13 MED ORDER — SODIUM CHLORIDE 0.9% IV SOLUTION: Freq: Once | INTRAVENOUS | Status: DC

## 2023-07-13 NOTE — Consult Note (Signed)
 WOC Nurse Consult Note: Reason for Consult: left elbow skin tear Wound type: full thickness; trauma Pressure Injury POA: NA Measurement: see nursing flow sheets Wound bed: see nursing flow sheets Drainage (amount, consistency, odor) none documented Periwound: intact  Dressing procedure/placement/frequency: Cleanse wound with saline, pat dry Cover with single layer of xeroform gauze, top with foam or gauze based on patient's skin.  Change every other day   Re consult if needed, will not follow at this time. Thanks  Jahnaya [employer], RN,CWOCN, CNS, CWON-AP [phone])

## 2023-07-13 NOTE — TOC Progression Note (Addendum)
 Transition of Care [facility]) - Progression Note    Patient Details  Name: Francisco [name] MRN: [id number] Date of Birth: July 12, 1945  [facility] CM/SW Contact  Robynn Eileen Hoose, RN Phone Number: 07/13/2023, 11:57 AM  Clinical Narrative:    Secure message regarding patient needing Bipap. Bipap arranged through [place] with [employer]. Orders needing provider signature sent to unit to be placed on patient chart for. Team aware of orders needing to be signed and where to find them.   1518: Signed order forms for NIV/Bipap, received, sent secured email to [facility] with [employer].     Barriers to Discharge: Continued Medical Work up  Expected Discharge Plan and Services       Living arrangements for the past 2 months: Single Family Home                 DME Arranged: NIV DME Agency: [employer] Date DME Agency Contacted: 07/11/23 Time DME Agency Contacted: 1521 Representative spoke with at DME Agency: [facility] Arranged:  [place] [facility])           Social Determinants of Health (SDOH) Interventions SDOH Screenings   Food Insecurity: No Food Insecurity (07/12/2023)  Housing: Unknown (07/12/2023)  Transportation Needs: No Transportation Needs (07/12/2023)  Utilities: Not At Risk (07/12/2023)  Alcohol  Screen: Low Risk  (02/03/2023)  Depression (PHQ2-9): Low Risk  (03/03/2023)  Financial Resource Strain: Low Risk  (02/03/2023)  Physical Activity: Unknown (02/03/2023)  Social Connections: Moderately Isolated (07/12/2023)  Stress: No Stress Concern Present (02/03/2023)  Tobacco Use: Medium Risk (05/26/2023)    Readmission Risk Interventions    08/13/2022    1:46 PM  Readmission Risk Prevention Plan  Transportation Screening Complete  PCP or Specialist Appt within 5-7 Days Complete  Home Care Screening Complete  Medication Review (RN CM) Complete

## 2023-07-13 NOTE — Progress Notes (Signed)
 ABG obtained with patient on Bipap. Settings listed below. IPAP 12 EPAP 5 RR 12 40%

## 2023-07-13 NOTE — Progress Notes (Signed)
   Patients condition quickly deteriorates without ventilator. Removal of the ventilator may cause serious harm to the patient, exacerbation of condition and hospital readmission. Bilevel/RAD has been tried and failed to maintain or stabilize the patient. Bilevel cannot meet current volume requirements. Patient requires frequent durations of ventilatory support. Intermittent usage is insufficient.   ABG obtained with patient on Bipap. Settings listed below. IPAP 12 EPAP 5 RR 12 40%  Blood gas, arterial [[id number]] (Abnormal) Collected: 07/13/23 1106  Specimen: Blood, Arterial Updated: 07/13/23 1129   pH, Arterial 7.34 Low    pCO2 arterial 83 High Panic  mmHg    pO2, Arterial 111 High  mmHg    Bicarbonate 44.8 High  mmol/L    Acid-Base Excess 14.7 High  mmol/L    O2 Saturation 99.6 %    Patient temperature 36.6   Collection site [id number] BIPAP   Drawn by [id number]   Allens test (pass/fail) PASS    Camellia Door, DO [facility]

## 2023-07-13 NOTE — Assessment & Plan Note (Addendum)
 07-13-2023 likely due to skin tears and concomitant systemic anticoagulation with Eliquis. Will transfuse with 2 unit PRBC. Give 40 mg IV lasix after 1 units PRBC. 07-14-2023 HgB 10.3 g/dl after transfusion.

## 2023-07-13 NOTE — Progress Notes (Signed)
   07/13/23 1040  BiPAP/CPAP/SIPAP  BiPAP/CPAP/SIPAP Pt Type Adult  BiPAP/CPAP/SIPAP V60  Mask Type Full face mask  Mask Size Large  Set Rate 12 breaths/min  Respiratory Rate 16 breaths/min  IPAP 12 cmH20  EPAP 5 cmH2O  FiO2 (%) 40 %  Minute Ventilation 7.4  Leak 1  Peak Inspiratory Pressure (PIP) 13  Tidal Volume (Vt) 415  Patient Home Equipment No  Auto Titrate No  Press High Alarm 25 cmH2O  Press Low Alarm 5 cmH2O  Oxygen Percent 40 %  BiPAP/CPAP /SiPAP Vitals  Pulse Rate (!) 103  Resp 15  BP 116/60  SpO2 97 %  Bilateral Breath Sounds Rhonchi;Coarse crackles  MEWS Score/Color  MEWS Score 1  MEWS Score Color Green   Patient placed on Bipap per physician request. ABG to be obtained in 15 minutes.

## 2023-07-13 NOTE — Progress Notes (Signed)
 PROGRESS NOTE    Francisco [name]  FMW:[id number] DOB: [birth year] DOA: 07/09/2023 PCP: [name], MD  Subjective: Pt seen and examined.  Met with pt's son derek and pt's wife at bedside HgB down to 7.4 g/dl. Pt had bleeding from his skin tear. Still awaiting approval for NIV  Discussed with pt, wife and son. They are agreeable to PRBC transfusion today. This may help his breathing as well.   Hospital Course: HPI: Francisco [name] is a 78 y.o. male with medical history significant of hypertension, hyperlipidemia, chronic CHF, BPH, PAD, anemia, GI AVM, compression fracture, essential tremor, chronic respiratory failure with hypoxia, severe COPD, pulmonary embolism, small cell lung cancer presenting with worsening shortness of breath and cough.   Patient has had a couple weeks of cough which worsened today and associated significant shortness of breath today.  Had been evaluated by pulmonology by telephone visit and been prescribed prednisone  taper and ciprofloxacin .  Which she has started.  Is chronically on 10 mg prednisone  daily.   Today with worsening systems he took his 40 p.o. prednisone  prescribed by pulmonologist and 4 puffs of albuterol  without improvement.  Received nebulizer and route with EMS with some improvement.  Had been desaturating as low as the 70s on home 4 L which she typically takes with exertion.   Denies fevers, chills, chest pain, abdominal pain, constipation, diarrhea, nausea, vomiting.  Significant Events: Admitted 07/09/2023 for COPD exacerbation, acute on chronic respiratory failure with hypoxia and hypercapnia   Significant Labs: chloride 89, bicarb stable 42. CBC with hemoglobin stable at 9.2. Lactic acid borderline at 1.9 and then 2.2 on repeat. Respiratory panel for flu COVID RSV negative.   Significant Imaging Studies: Chest x-ray with stable chronic changes and no acute abnormality.   Antibiotic Therapy: Anti-infectives (From admission,  onward)    Start     Dose/Rate Route Frequency Ordered Stop   07/10/23 1000  azithromycin  (ZITHROMAX ) tablet 500 mg        500 mg Oral Daily 07/09/23 1503     07/09/23 1430  azithromycin  (ZITHROMAX ) 500 mg in sodium chloride  0.9 % 250 mL IVPB        500 mg 250 mL/hr over 60 Minutes Intravenous  Once 07/09/23 1423 07/09/23 1531       Procedures:   Consultants:     Assessment and Plan: * Acute on chronic respiratory failure with hypoxia and hypercapnia (HCC) 07-10-2023 VBG obtained this AM. pH 7.3, PCO2 99, bicarb 49. Has both acute and chronic respiratory acidosis with metabolic alkalosis.  PCCM consulted. May need NIV at home. Discussed at length with pt's wife and dtr. Placed on NIV per PCCM. Keep NPO for now. 07-11-2023 respiratory status stable enough this AM to start diet.  Continue with NIV during sleep.  Baseline PCO2 appears to be in the low 70s.  pH this AM 7.42 07-12-2023 ABG on NIV this AM shows pH 7.4 with PCO2 of 68. TOC working on obtaining NIV for home use.  07-13-2023 pt needs ABG on NIV to document his need for home NIV machine. Getting this done today.  Acute blood loss anemia (ABLA) 07-13-2023 likely due to skin tears and concomitant systemic anticoagulation with Eliquis . Will transfuse with 2 unit PRBC. Give 40 mg IV lasix  after 1 units PRBC.  COPD with acute exacerbation (HCC) 07-10-2023 defer management to PCCM. He was placed on IV steroids, IV ceftaz and IV vanco. On NIV. PCCM wrote to change to IV steroids. Pt  received 125 mg IV solumedrol yesterday AM. Will continue with 80 mg q12h IV solumedrol. Continue with NIV. Will ask CM to look into home NIV for him. 07/12/2023 no wheezing. Change to po prednisone . PCCM to manage IV Abx. Would advocate in stopping abx.  07-13-2023 discussed with PCCM yesterday. Have stopped all IV Abx. Change back to MWF zithromax . Now on po prednisone .  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 now on IV Ceftaz and  vanco per PCCM. 07/11/2023 continue IV ceftax and vanco per [facility] 07/12/2023 defer abx management to PCCM. WBC 4.3, no fevers. Checking CT chest. 07-13-2023 discussed with PCCM. Have stopped all IV abx. Only on MWF zithromax  to help with his sputum production.  Chronic systolic congestive heart failure (HCC) 07/10/2023 stable. Holding lasix  for now as this could contribute to alkalosis. 07-11-2023 stable. Repeat BMP in AM. 07/12/2023 his metabolic alkalosis is improving with NIV treatment. 07-13-2023 continue to hold scheduled lasix .  Anemia 07/10/2023 stable. 07-11-2023 stable 07-12-2023 HgB 7.9 today.  Was 9.2 3 days ago. Had skin tear on left arm on admission. Wound care consult. 07-13-2023 see acute blood loss anemia. Getting 2u PRBC transfusion today.  H/O arteriovenous malformation (AVM) 07/10/2023 stable. No evidence of bleeding. 07-11-2023 thru 07-13-2023 stable  Malignant neoplasm of lower lobe of right lung (HCC) 07/10/2023 stable. 07-11-2023 stable 07-12-2023 pt was scheduled for CT chest with IV contrast as followup. Will check CT chest with IV contrast per family request. 07-13-2023 CT chest with IV contrast completed per family request. Will forward results to his oncologist.  PAD (peripheral artery disease) (HCC) 07/10/2023 remains on crestor  and eliquis . 07-11-2023 thru 07-13-2023 stable  Chronic pulmonary embolism (HCC) 07/10/2023 continue with Eliquis  2.5 mg bid. 07-11-2023 thru 07-13-2023 stable  Essential hypertension 07/10/2023 stable 07-11-2023 thru 07-13-2023 stable  BPH associated with nocturia 07/10/2023 chronic 07-11-2023 thru 07-13-2023 stable  Hyperlipidemia 07/10/2023 continue crestor  20 mg qpm. 07-11-2023 thru 07-13-2023 stable  Skin tear of elbow without complication, left, initial encounter 07/10/2023 present on admission. Will get wound care consult.  DNR (do not resuscitate) 07/10/2023 PCCM discussed with pt, wife and dtr. Pt was  made DNR/DNI.  Chronic respiratory failure with hypoxia and hypercapnia (HCC) - baseline PCO2 in the 70s 07/10/2023 evidence of chronic CO2 retention with VBG showing pH of 7.3 and PCO2 99.  Pt may need NIV at home/discharge. 07/11/2023 baseline PCO2 in the low 70s. This AM pH 7.42. CM looking into home NIV. 07/12/2023 ABG on NIV this AM. pH 7.4, PCO2 of 68. TOC working on home NIV.  07-13-2023 pt needs ABG on NIV to document his need for home NIV machine. Getting this done today.    DVT prophylaxis: apixaban  (ELIQUIS ) tablet 2.5 mg Start: 07/09/23 2200 apixaban  (ELIQUIS ) tablet 2.5 mg     Code Status: Limited: Do not attempt resuscitation (DNR) -DNR-LIMITED -Do Not Intubate/DNI  Family Communication: discussed with pt, wife and son at bedside Disposition Plan: return home Reason for continuing need for hospitalization: awaiting for approval of NIV machine for home use. Pt cannot leave hospital until this home NIV machine as been obtained. His risk for acute decompensation of his chronic respiratory failure is too high.  Objective: Vitals:   07/13/23 0247 07/13/23 0848 07/13/23 0939 07/13/23 1040  BP: 132/64 116/60 116/60 116/60  Pulse: 100  (!) 105 (!) 103  Resp: 19  (!) 21 15  Temp: 98.2 F (36.8 C) 97.8 F (36.6 C)    TempSrc: Oral Oral    SpO2: 92%  100% 97%    Intake/Output Summary (Last 24 hours) at 07/13/2023 1121 Last data filed at 07/13/2023 0249 Gross per 24 hour  Intake --  Output 400 ml  Net -400 ml   There were no vitals filed for this visit.  Examination:  Physical Exam Vitals and nursing note reviewed.  Constitutional:      General: He is not in acute distress.    Appearance: He is not toxic-appearing or diaphoretic.     Comments: Sitting on bedside commode  HENT:     Head: Normocephalic.  Cardiovascular:     Rate and Rhythm: Regular rhythm. Tachycardia present.  Pulmonary:     Effort: Pulmonary effort is normal. No respiratory distress.     Breath  sounds: No rales.  Abdominal:     General: There is no distension.  Skin:    General: Skin is warm and dry.     Capillary Refill: Capillary refill takes less than 2 seconds.     Data Reviewed: I have personally reviewed following labs and imaging studies  CBC: Recent Labs  Lab 07/09/23 1001 07/10/23 0643 07/12/23 0425 07/13/23 0444  WBC 10.4 3.6* 4.3 5.4  NEUTROABS  --   --  4.1 4.1  HGB 9.2* 8.5* 7.9* 7.4*  HCT 31.0* 28.8* 25.6* 23.8*  MCV 100.6* 100.0 96.2 98.3  PLT 167 141* 122* 108*   Basic Metabolic Panel: Recent Labs  Lab 07/09/23 1001 07/10/23 0643 07/11/23 0639 07/12/23 0425 07/13/23 0444  NA 141 140  --  139 137  K 4.2 5.1  --  4.4 4.1  CL 89* 90*  --  92* 90*  CO2 42* 43*  --  36* 39*  GLUCOSE 128* 117*  --  121* 102*  BUN 15 17  --  22 20  CREATININE 0.99 0.99  --  0.92 0.98  CALCIUM  9.4 9.0  --  8.1* 8.0*  MG  --   --  2.4 2.3 2.2  PHOS  --   --  2.3*  --   --    GFR: CrCl cannot be calculated (Unknown ideal weight.). Liver Function Tests: Recent Labs  Lab 07/10/23 0643 07/12/23 0425 07/13/23 0444  AST 22 26 27   ALT 16 17 19   ALKPHOS 43 38 35*  BILITOT 0.5 0.6 0.4  PROT 5.8* 5.2* 4.8*  ALBUMIN  3.1* 2.8* 2.6*   BNP (last 3 results) Recent Labs    08/14/22 0617 02/20/23 1617 07/11/23 0639  BNP 143.2* 86.0 221.1*   Sepsis Labs: Recent Labs  Lab 07/09/23 1001 07/09/23 1249 07/11/23 0639  PROCALCITON  --   --  <0.10  LATICACIDVEN 1.9 2.2*  --     Recent Results (from the past 240 hours)  Resp panel by RT-PCR (RSV, Flu A&B, Covid)     Status: None   Collection Time: 07/09/23 10:01 AM   Specimen: Nasal Swab  Result Value Ref Range Status   SARS Coronavirus 2 by RT PCR NEGATIVE NEGATIVE Final   Influenza A by PCR NEGATIVE NEGATIVE Final   Influenza B by PCR NEGATIVE NEGATIVE Final    Comment: (NOTE) The Xpert Xpress SARS-CoV-2/FLU/RSV plus assay is intended as an aid in the diagnosis of influenza from Nasopharyngeal swab  specimens and should not be used as a sole basis for treatment. Nasal washings and aspirates are unacceptable for Xpert Xpress SARS-CoV-2/FLU/RSV testing.  Fact Sheet for Patients: bloggercourse.com  Fact Sheet for Healthcare Providers: seriousbroker.it  This test is not yet approved or cleared by  the [place]  FDA and has been authorized for detection and/or diagnosis of SARS-CoV-2 by FDA under an Emergency Use Authorization (EUA). This EUA will remain in effect (meaning this test can be used) for the duration of the COVID-19 declaration under Section 564(b)(1) of the Act, 21 U.S.C. section 360bbb-3(b)(1), unless the authorization is terminated or revoked.     Resp Syncytial Virus by PCR NEGATIVE NEGATIVE Final    Comment: (NOTE) Fact Sheet for Patients: bloggercourse.com  Fact Sheet for Healthcare Providers: seriousbroker.it  This test is not yet approved or cleared by the [place]  FDA and has been authorized for detection and/or diagnosis of SARS-CoV-2 by FDA under an Emergency Use Authorization (EUA). This EUA will remain in effect (meaning this test can be used) for the duration of the COVID-19 declaration under Section 564(b)(1) of the Act, 21 U.S.C. section 360bbb-3(b)(1), unless the authorization is terminated or revoked.  Performed at [facility], [address], [place], [place] [address]   Culture, blood (routine x 2)     Status: None (Preliminary result)   Collection Time: 07/09/23 10:03 AM   Specimen: BLOOD RIGHT WRIST  Result Value Ref Range Status   Specimen Description BLOOD RIGHT WRIST  Final   Special Requests   Final    BOTTLES DRAWN AEROBIC AND ANAEROBIC Blood Culture results may not be optimal due to an inadequate volume of blood received in culture bottles   Culture   Final    NO GROWTH 4 DAYS Performed at [facility], [address], [place], [place] [address]    Report Status PENDING  Incomplete  Culture, blood (routine x 2)     Status: None (Preliminary result)   Collection Time: 07/09/23 10:25 AM   Specimen: BLOOD RIGHT HAND  Result Value Ref Range Status   Specimen Description BLOOD RIGHT HAND  Final   Special Requests   Final    BOTTLES DRAWN AEROBIC ONLY Blood Culture results may not be optimal due to an inadequate volume of blood received in culture bottles   Culture   Final    NO GROWTH 4 DAYS Performed at [facility], [address] [place], [place] [address]    Report Status PENDING  Incomplete  Respiratory (~20 pathogens) panel by PCR     Status: None   Collection Time: 07/09/23  3:03 PM   Specimen: Nasopharyngeal Swab; Respiratory  Result Value Ref Range Status   Adenovirus NOT DETECTED NOT DETECTED Final   Coronavirus 229E NOT DETECTED NOT DETECTED Final    Comment: (NOTE) The Coronavirus on the Respiratory Panel, DOES NOT test for the novel  Coronavirus (2019 nCoV)    Coronavirus HKU1 NOT DETECTED NOT DETECTED Final   Coronavirus NL63 NOT DETECTED NOT DETECTED Final   Coronavirus OC43 NOT DETECTED NOT DETECTED Final   Metapneumovirus NOT DETECTED NOT DETECTED Final   Rhinovirus / Enterovirus NOT DETECTED NOT DETECTED Final   Influenza A NOT DETECTED NOT DETECTED Final   Influenza B NOT DETECTED NOT DETECTED Final   Parainfluenza Virus 1 NOT DETECTED NOT DETECTED Final   Parainfluenza Virus 2 NOT DETECTED NOT DETECTED Final   Parainfluenza Virus 3 NOT DETECTED NOT DETECTED Final   Parainfluenza Virus 4 NOT DETECTED NOT DETECTED Final   Respiratory Syncytial Virus NOT DETECTED NOT DETECTED Final   Bordetella pertussis NOT DETECTED NOT DETECTED Final   Bordetella Parapertussis NOT DETECTED NOT DETECTED Final   Chlamydophila pneumoniae NOT DETECTED NOT DETECTED Final  Mycoplasma pneumoniae NOT DETECTED NOT DETECTED Final    Comment: Performed at [facility], [address] [place], [place] [address]  MRSA Next Gen by PCR, Nasal     Status: None   Collection Time: 07/10/23  3:26 PM   Specimen: Nasal Mucosa; Nasal Swab  Result Value Ref Range Status   MRSA by PCR Next Gen NOT DETECTED NOT DETECTED Final    Comment: (NOTE) The GeneXpert MRSA Assay (FDA approved for NASAL specimens only), is one component of a comprehensive MRSA colonization surveillance program. It is not intended to diagnose MRSA infection nor to guide or monitor treatment for MRSA infections. Test performance is not FDA approved in patients less than 76 years old. Performed at [facility], [address], [place], [place] [address]   Expectorated Sputum Assessment w Gram Stain, Rflx to Resp Cult     Status: None   Collection Time: 07/12/23 11:23 AM   Specimen: Sputum  Result Value Ref Range Status   Specimen Description SPUTUM  Final   Special Requests NONE  Final   Sputum evaluation   Final    Sputum specimen not acceptable for testing.  Please recollect.   Results Called to: RN ARAN MENNING [id number] BY JINNY COMMON, [place] Performed at [facility], [address] [place], [place] [address]    Report Status 07/12/2023 FINAL  Final  Expectorated Sputum Assessment w Gram Stain, Rflx to Resp Cult     Status: None   Collection Time: 07/13/23  3:11 AM   Specimen: SPU  Result Value Ref Range Status   Specimen Description SPUTUM  Final   Special Requests NONE  Final   Sputum evaluation   Final    Sputum specimen not acceptable for testing.  Please recollect.   RESULT CALLED TO, READ BACK BY AND VERIFIED WITH: DAEJA,RN@0435  07/13/23 MK Performed at [facility], [address], [place], [place] [address]    Report Status 07/13/2023 FINAL  Final     Radiology Studies: CT CHEST W CONTRAST Result Date: 07/12/2023 CLINICAL DATA:  Evaluate for pneumonia. History of small cell lung cancer. * Tracking Code: BO * EXAM: CT CHEST WITH CONTRAST TECHNIQUE: Multidetector CT  imaging of the chest was performed during intravenous contrast administration. RADIATION DOSE REDUCTION: This exam was performed according to the departmental dose-optimization program which includes automated exposure control, adjustment of the mA and/or kV according to patient size and/or use of iterative reconstruction technique. CONTRAST:  75mL OMNIPAQUE  IOHEXOL  350 MG/ML SOLN COMPARISON:  12/24/2022 FINDINGS: Cardiovascular: Normal heart size. No pericardial effusion. Aortic atherosclerosis. Coronary artery calcifications. Mediastinum/Nodes: Thyroid  gland, trachea, and esophagus appear normal. No enlarged supraclavicular, axillary, mediastinal, or hilar lymph nodes. Lungs/Pleura: Small bilateral pleural effusions. Severe changes of emphysema with diffuse bronchial wall thickening. Scarring and architectural distortion with volume loss is noted within the right upper lobe extending into the apex. This is stable in appearance when compared with the previous exam. Within the right midlung there is perihilar fibrosis and architectural distortion which is unchanged from the previous exam and is compatible with post treatment changes. A small fiducial marker is identified within this area, image 64/5. Chronic scarring within the right lung base is also unchanged in the interval, image 99/5. No superimposed acute airspace consolidation to suggest pneumonia. Nodule within the posterior right apex is stable measuring 6 mm, image 20/5. Upper Abdomen: No acute abnormality. Unchanged appearance of small right adrenal gland adenoma, image  138/3. No follow-up imaging recommended. Cystic lesion arising off the tail of the pancreas is again noted. This measures 2.4 x 1.9 cm, image 168/3. Previously this measured the same. Musculoskeletal: The bones are diffusely osteopenic. Extensive multifocal compression fractures are identified within the thoracic spine. Compression fractures involving the T6, T7 and T8 vertebra are stable  compared with 12/24/2022. There are new fracture deformities involving the T11 and T12 vertebra with loss of approximately 30% of the vertebral body heights at these levels. Compression fracture involving the L2 vertebra is unchanged from 12/24/2022. IMPRESSION: 1. No acute cardiopulmonary abnormalities. No signs of pneumonia. 2. Small bilateral pleural effusions. New from previous CT from 12/24/2022. 3. Stable appearance of post treatment changes within the right lung. 4. Stable appearance of 6 mm nodule within the posterior right apex. 5. Stable appearance of cystic lesion arising off the tail of the pancreas. See report from 07/27/2022 for follow-up recommendations. 6. Extensive multifocal compression fractures are identified within the thoracic spine. Compression fractures involving the T6, T7 and T8 vertebra are stable compared with 12/24/2022. There are new fracture deformities involving the T11 and T12 vertebra with loss of approximately 30% of the vertebral body heights at these levels. Compression fracture involving the L2 vertebra is unchanged from 12/24/2022. 7. Coronary artery calcifications. 8. Aortic Atherosclerosis (ICD10-I70.0) and Emphysema (ICD10-J43.9). Electronically Signed   By: Waddell Calk M.D.   On: 07/12/2023 17:12    Scheduled Meds:  sodium chloride    Intravenous Once   apixaban   2.5 mg Oral BID   [START ON 07/14/2023] azithromycin   250 mg Oral Q M,W,F   Chlorhexidine  Gluconate Cloth  6 each Topical Q0600   ciprofloxacin   750 mg Oral BID   ipratropium-albuterol   3 mL Nebulization TID   mupirocin  ointment  1 Application Nasal BID   pantoprazole   20 mg Oral Daily   polyvinyl alcohol   1 drop Both Eyes QODAY   predniSONE   40 mg Oral Q breakfast   roflumilast   500 mcg Oral Daily   rosuvastatin   20 mg Oral QPM   sodium chloride  HYPERTONIC  4 mL Nebulization BID   Continuous Infusions:   LOS: 2 days   Time spent: 40 minutes  Camellia Door, DO  [facility]  07/13/2023,  11:21 AM

## 2023-07-14 ENCOUNTER — Encounter: Payer: Self-pay | Admitting: Family Medicine

## 2023-07-14 DIAGNOSIS — J9621 Acute and chronic respiratory failure with hypoxia: Secondary | ICD-10-CM | POA: Diagnosis not present

## 2023-07-14 DIAGNOSIS — Z66 Do not resuscitate: Secondary | ICD-10-CM | POA: Diagnosis not present

## 2023-07-14 DIAGNOSIS — J441 Chronic obstructive pulmonary disease with (acute) exacerbation: Secondary | ICD-10-CM | POA: Diagnosis not present

## 2023-07-14 DIAGNOSIS — D62 Acute posthemorrhagic anemia: Secondary | ICD-10-CM | POA: Diagnosis not present

## 2023-07-14 LAB — BPAM RBC
Blood Product Expiration Date: 202501282359
Blood Product Expiration Date: 202501282359
ISSUE DATE / TIME: 202501051132
ISSUE DATE / TIME: 202501051603
Unit Type and Rh: 6200
Unit Type and Rh: 6200

## 2023-07-14 LAB — TYPE AND SCREEN
ABO/RH(D): A POS
Antibody Screen: NEGATIVE
Unit division: 0
Unit division: 0

## 2023-07-14 LAB — CULTURE, BLOOD (ROUTINE X 2)
Culture: NO GROWTH
Culture: NO GROWTH

## 2023-07-14 LAB — COMPREHENSIVE METABOLIC PANEL
ALT: 23 U/L (ref 0–44)
AST: 29 U/L (ref 15–41)
Albumin: 2.6 g/dL — ABNORMAL LOW (ref 3.5–5.0)
Alkaline Phosphatase: 41 U/L (ref 38–126)
Anion gap: 9 (ref 5–15)
BUN: 19 mg/dL (ref 8–23)
CO2: 40 mmol/L — ABNORMAL HIGH (ref 22–32)
Calcium: 7.8 mg/dL — ABNORMAL LOW (ref 8.9–10.3)
Chloride: 91 mmol/L — ABNORMAL LOW (ref 98–111)
Creatinine, Ser: 1.01 mg/dL (ref 0.61–1.24)
GFR, Estimated: >60 mL/min (ref >60)
Glucose, Bld: 108 mg/dL — ABNORMAL HIGH (ref 70–99)
Potassium: 4 mmol/L (ref 3.5–5.1)
Sodium: 140 mmol/L (ref 135–145)
Total Bilirubin: 0.5 mg/dL (ref 0.0–1.2)
Total Protein: 4.9 g/dL — ABNORMAL LOW (ref 6.5–8.1)

## 2023-07-14 LAB — CBC WITH DIFFERENTIAL/PLATELET
Abs Immature Granulocytes: 0.05 10*3/uL (ref 0.00–0.07)
Basophils Absolute: 0 10*3/uL (ref 0.0–0.1)
Basophils Relative: 0 %
Eosinophils Absolute: 0 10*3/uL (ref 0.0–0.5)
Eosinophils Relative: 0 %
HCT: 32.5 % — ABNORMAL LOW (ref 39.0–52.0)
Hemoglobin: 10.3 g/dL — ABNORMAL LOW (ref 13.0–17.0)
Immature Granulocytes: 1 %
Lymphocytes Relative: 7 %
Lymphs Abs: 0.5 10*3/uL — ABNORMAL LOW (ref 0.7–4.0)
MCH: 29.2 pg (ref 26.0–34.0)
MCHC: 31.7 g/dL (ref 30.0–36.0)
MCV: 92.1 fL (ref 80.0–100.0)
Monocytes Absolute: 0.8 10*3/uL (ref 0.1–1.0)
Monocytes Relative: 12 %
Neutro Abs: 5.3 10*3/uL (ref 1.7–7.7)
Neutrophils Relative %: 80 %
Platelets: 100 10*3/uL — ABNORMAL LOW (ref 150–400)
RBC: 3.53 MIL/uL — ABNORMAL LOW (ref 4.22–5.81)
RDW: 15.9 % — ABNORMAL HIGH (ref 11.5–15.5)
WBC: 6.6 10*3/uL (ref 4.0–10.5)
nRBC: 0 % (ref 0.0–0.2)

## 2023-07-14 LAB — MAGNESIUM: Magnesium: 2 mg/dL (ref 1.7–2.4)

## 2023-07-14 MED ORDER — PREDNISONE 10 MG PO TABS: ORAL_TABLET | ORAL | 0 refills | Status: AC

## 2023-07-14 MED ORDER — IPRATROPIUM-ALBUTEROL 0.5-2.5 (3) MG/3ML IN SOLN: 3.0000 mL | Freq: Three times a day (TID) | RESPIRATORY_TRACT | 0 refills | Status: DC

## 2023-07-14 MED ORDER — AZITHROMYCIN 250 MG PO TABS: 250.0000 mg | ORAL_TABLET | ORAL | 0 refills | Status: DC

## 2023-07-14 MED ORDER — FUROSEMIDE 20 MG PO TABS: 20.0000 mg | ORAL_TABLET | Freq: Every day | ORAL | 0 refills | Status: DC | PRN

## 2023-07-14 NOTE — Progress Notes (Signed)
 Pt ready to come off Bipap at this time. RT placed pt back on 4L [place], VS stable, RT will follow up as needed.

## 2023-07-14 NOTE — Discharge Summary (Addendum)
 [facility] Phys[facility] Discharge Summary   Patient name: Francisco [name]  Admit date:     07/09/2023  Discharge date: 07/14/2023  Attending Physician: LAURENCE, Laiden Milles [[id number]]  Discharge Physician: Camellia Laurence   PCP: [name], MD  Admitted From: Home  Disposition:  Home  Recommendations for Outpatient Follow-up:  Follow up with PCP in 1-2 weeks Follow up with [facility] in 2 weeks Please follow up on the following pending results: on sputum culture  Home Health:No Equipment/Devices: Home Bipap machine settings:  IPAP 12 EPAP 5 RR 12 FiO2 40%  Oxygen 4 L/min continuous  Discharge Condition:Guarded CODE STATUS:DNR/DNI Diet recommendation: Heart Healthy Fluid Restriction: None  Hospital Summary: HPI: Francisco [name] is a 78 y.o. male with medical history significant of hypertension, hyperlipidemia, chronic CHF, BPH, PAD, anemia, GI AVM, compression fracture, essential tremor, chronic respiratory failure with hypoxia, severe COPD, pulmonary embolism, small cell lung cancer presenting with worsening shortness of breath and cough.   Patient has had a couple weeks of cough which worsened today and associated significant shortness of breath today.  Had been evaluated by pulmonology by telephone visit and been prescribed prednisone  taper and ciprofloxacin .  Which she has started.  Is chronically on 10 mg prednisone  daily.   Today with worsening systems he took his 42 p.o. prednisone  prescribed by pulmonologist and 4 puffs of albuterol  without improvement.  Received nebulizer and route with EMS with some improvement.  Had been desaturating as low as the 70s on home 4 L which she typically takes with exertion.   Denies fevers, chills, chest pain, abdominal pain, constipation, diarrhea, nausea, vomiting.  Significant Events: Admitted 07/09/2023 for COPD exacerbation, acute on chronic respiratory failure with hypoxia and hypercapnia   Significant Labs: chloride 89,  bicarb stable 42. CBC with hemoglobin stable at 9.2. Lactic acid borderline at 1.9 and then 2.2 on repeat. Respiratory panel for flu COVID RSV negative.   Significant Imaging Studies: Chest x-ray with stable chronic changes and no acute abnormality.   Antibiotic Therapy: Anti-infectives (From admission, onward)    Start     Dose/Rate Route Frequency Ordered Stop   07/10/23 1000  azithromycin  (ZITHROMAX ) tablet 500 mg        500 mg Oral Daily 07/09/23 1503     07/09/23 1430  azithromycin  (ZITHROMAX ) 500 mg in sodium chloride  0.9 % 250 mL IVPB        500 mg 250 mL/hr over 60 Minutes Intravenous  Once 07/09/23 1423 07/09/23 1531       Procedures:   Consultants:    Hospital Course by Problem: * Acute on chronic respiratory failure with hypoxia and hypercapnia (HCC) 07-10-2023 VBG obtained this AM. pH 7.3, PCO2 99, bicarb 49. Has both acute and chronic respiratory acidosis with metabolic alkalosis.  PCCM consulted. May need NIV at home. Discussed at length with pt's wife and dtr. Placed on NIV per PCCM. Keep NPO for now. 07-11-2023 respiratory status stable enough this AM to start diet.  Continue with NIV during sleep.  Baseline PCO2 appears to be in the low 70s.  pH this AM 7.42 07-12-2023 ABG on NIV this AM shows pH 7.4 with PCO2 of 68. TOC working on obtaining NIV for home use. 07-13-2023 pt needs ABG on NIV to document his need for home NIV machine. Getting this done today.  07-14-2023 ABG on NIV yesterday showed pH 7.34, PCO2 of 83. Need home NIV machine. Awaiting TOC/HH to get this arranged. He cannot leave  hospital until he has this machine.  *update. Home Bipap machine authorization obtained and can be delivered to his home today. Settings: IPAP 12 EPAP 5 RR 12 FiO2 40%  Acute blood loss anemia (ABLA) 07-13-2023 likely due to skin tears and concomitant systemic anticoagulation with Eliquis . Will transfuse with 2 unit PRBC. Give 40 mg IV lasix  after 1 units  PRBC. 07-14-2023 HgB 10.3 g/dl after transfusion.  COPD with acute exacerbation (HCC) 07-10-2023 defer management to PCCM. He was placed on IV steroids, IV ceftaz and IV vanco. On NIV. PCCM wrote to change to IV steroids. Pt received 125 mg IV solumedrol yesterday AM. Will continue with 80 mg q12h IV solumedrol. Continue with NIV. Will ask CM to look into home NIV for him. 07/12/2023 no wheezing. Change to po prednisone . PCCM to manage IV Abx. Would advocate in stopping abx. 07-13-2023 discussed with PCCM yesterday. Have stopped all IV Abx. Change back to MWF zithromax . Now on po prednisone .  07-14-2023 doing well with po prednisone  40 mg daily. On duonebs tid. *DC to home with 12 days prednisone  taper starting at 40 mg daily.  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 now on IV Ceftaz and vanco per PCCM. 07/11/2023 continue IV ceftax and vanco per [facility] 07/12/2023 defer abx management to PCCM. WBC 4.3, no fevers. Checking CT chest. 07-13-2023 discussed with PCCM. Have stopped all IV abx. Only on MWF zithromax  to help with his sputum production. 07-14-2023 stable.  Chronic systolic congestive heart failure (HCC) 07/10/2023 stable. Holding lasix  for now as this could contribute to alkalosis. 07-11-2023 stable. Repeat BMP in AM. 07/12/2023 his metabolic alkalosis is improving with NIV treatment. 07-13-2023 continue to hold scheduled lasix . 07-14-2023 don't think he needs scheduled lasix  at discharge.  Anemia 07/10/2023 stable. 07-11-2023 stable 07-12-2023 HgB 7.9 today.  Was 9.2 3 days ago. Had skin tear on left arm on admission. Wound care consult. 07-13-2023 see acute blood loss anemia. Getting 2u PRBC transfusion today. 07-14-2023 post-transfusion HgB 10.3 g/dl  H/O arteriovenous malformation (AVM) 07/10/2023 stable. No evidence of bleeding. 07-11-2023 thru 07-14-2023 stable  Malignant neoplasm of lower lobe of right lung (HCC) 07/10/2023 stable. 07-11-2023  stable 07-12-2023 pt was scheduled for CT chest with IV contrast as followup. Will check CT chest with IV contrast per family request. 07-13-2023 CT chest with IV contrast completed per family request. Will forward results to his oncologist.  PAD (peripheral artery disease) (HCC) 07/10/2023 remains on crestor  and eliquis . 07-11-2023 thru 07-14-2023 stable  Chronic pulmonary embolism (HCC) 07/10/2023 continue with Eliquis  2.5 mg bid. 07-11-2023 thru 07-14-2023 stable  Essential hypertension 07/10/2023 stable 07-11-2023 thru 07-14-2023 stable  BPH associated with nocturia 07/10/2023 chronic 07-11-2023 thru 07-14-2023 stable  Hyperlipidemia 07/10/2023 continue crestor  20 mg qpm. 07-11-2023 thru 07-14-2023 stable  Skin tear of elbow without complication, left, initial encounter 07/10/2023 present on admission. Will get wound care consult. 07-13-2023 wound care has seen patient. No new changes.  DNR (do not resuscitate) 07/10/2023 PCCM discussed with pt, wife and dtr. Pt was made DNR/DNI.   Chronic respiratory failure with hypoxia and hypercapnia (HCC) - baseline PCO2 in the 70s 07/10/2023 evidence of chronic CO2 retention with VBG showing pH of 7.3 and PCO2 99.  Pt may need NIV at home/discharge. 07/11/2023 baseline PCO2 in the low 70s. This AM pH 7.42. CM looking into home NIV. 07/12/2023 ABG on NIV this AM. pH 7.4, PCO2 of 68. TOC working on home NIV. 07-13-2023 pt needs ABG on NIV to document his need for home  NIV machine. Getting this done today.   07-14-2023 ABG on NIV yesterday showed pH 7.34, PCO2 of 83. Need home NIV machine. Awaiting TOC/HH to get this arranged. He cannot leave hospital until he has this machine.   07-14-2023. Home bipap machine authorization obtained. Will be send home with home bipap machine. Bipap settings IPAP 12 EPAP 5 RR 12 FiO2 40%    Discharge Diagnoses:  Principal Problem:   Acute on chronic respiratory failure with hypoxia and  hypercapnia (HCC) Active Problems:   COPD with acute exacerbation (HCC)   Acute blood loss anemia (ABLA)   Hyperlipidemia   BPH associated with nocturia   Essential hypertension   Chronic pulmonary embolism (HCC)   PAD (peripheral artery disease) (HCC)   Malignant neoplasm of lower lobe of right lung (HCC)   H/O arteriovenous malformation (AVM)   Anemia   Chronic systolic congestive heart failure (HCC)   Pseudomonas aeruginosa colonization - pulmonary   Chronic respiratory failure with hypoxia and hypercapnia (HCC) - baseline PCO2 in the 70s   DNR (do not resuscitate)   Skin tear of elbow without complication, left, initial encounter   Discharge Instructions  Discharge Instructions     Bipap   Complete by: As directed    IPAP 12 EPAP 5 RR 12 40%   Call MD for:  difficulty breathing, headache or visual disturbances   Complete by: As directed    Call MD for:  extreme fatigue   Complete by: As directed    Call MD for:  hives   Complete by: As directed    Call MD for:  persistant dizziness or light-headedness   Complete by: As directed    Call MD for:  persistant nausea and vomiting   Complete by: As directed    Call MD for:  redness, tenderness, or signs of infection (pain, swelling, redness, odor or green/yellow discharge around incision site)   Complete by: As directed    Call MD for:  severe uncontrolled pain   Complete by: As directed    Call MD for:  temperature >100.4   Complete by: As directed    Diet - low sodium heart healthy   Complete by: As directed    Discharge instructions   Complete by: As directed    1. Follow up with primary care provider in 1 week 2. Follow up with [facility] in 2 weeks 3. You must use your Bipap machine EVERY single time your are asleep. No matter if this is a nap, during the daytime or at night.   Discharge wound care:   Complete by: As directed    Cleanse arm skin tear with saline, pat dry. Cover with single layer of  xeroform gauze, top with dry gauze or foam. Based on fragility of skin. Change every other day   Increase activity slowly   Complete by: As directed       Allergies as of 07/14/2023       Reactions   Tape Other (See Comments)   Skin tears easily   Augmentin  [amoxicillin -pot Clavulanate] Other (See Comments)   Thrush Sore throat Hoarse voice   Gadavist  [gadobutrol ] Other (See Comments)   Unknown reaction   Gadolinium Itching, Rash, Other (See Comments)   Itchy back rash   Levaquin  [levofloxacin ] Other (See Comments)   Hallucinations    Lipitor [atorvastatin ] Rash        Medication List     STOP taking these medications    ciprofloxacin  750 MG  tablet Commonly known as: CIPRO        TAKE these medications    acetaminophen  500 MG tablet Commonly known as: TYLENOL  Take 500 mg by mouth every 4 (four) hours as needed for moderate pain, fever, headache or mild pain.   albuterol  108 (90 Base) MCG/ACT inhaler Commonly known as: VENTOLIN  HFA Inhale 2 puffs into the lungs every 6 (six) hours as needed for wheezing or shortness of breath. What changed: Another medication with the same name was removed. Continue taking this medication, and follow the directions you see here.   azithromycin  250 MG tablet Commonly known as: ZITHROMAX  Take 1 tablet (250 mg total) by mouth every Monday, Wednesday, and Friday. hold if placed on other antibiotics What changed:  when to take this additional instructions   CALCIUM  + D3 PO Take 1 tablet by mouth daily with breakfast.   docusate sodium  100 MG capsule Commonly known as: COLACE Take 2 capsules (200 mg total) by mouth 2 (two) times daily. What changed:  when to take this reasons to take this   Eliquis  2.5 MG Tabs tablet Generic drug: apixaban  TAKE 1 TABLET BY MOUTH TWICE  DAILY What changed: how much to take   fluticasone  50 MCG/ACT nasal spray Commonly known as: FLONASE  USE 2 SPRAYS IN BOTH  NOSTRILS DAILY What changed:  See the new instructions.   folic acid  1 MG tablet Commonly known as: FOLVITE  Take 1 tablet (1 mg total) by mouth daily. What changed: Another medication with the same name was removed. Continue taking this medication, and follow the directions you see here.   furosemide  20 MG tablet Commonly known as: LASIX  Take 1 tablet (20 mg total) by mouth daily as needed for edema (for lower leg edema. do not take for more than 3 days in a row). What changed: See the new instructions.   ipratropium-albuterol  0.5-2.5 (3) MG/3ML Soln Commonly known as: DUONEB Take 3 mLs by nebulization 3 (three) times daily.   Iron  325 (65 Fe) MG Tabs Take 325 mg by mouth See admin instructions. Take 325 mg by mouth with food every other day   multivitamin with minerals Tabs tablet Take 1 tablet by mouth daily. What changed: when to take this   mupirocin  cream 2 % Commonly known as: Bactroban  Apply 1 Application topically 2 (two) times daily. What changed:  when to take this reasons to take this   OXYGEN Inhale 4.5 L/min into the lungs continuous.   pantoprazole  20 MG tablet Commonly known as: PROTONIX  TAKE 1 TABLET BY MOUTH DAILY What changed: when to take this   predniSONE  10 MG tablet Commonly known as: DELTASONE  Take 4 tablets (40 mg total) by mouth daily with breakfast for 3 days, THEN 3 tablets (30 mg total) daily with breakfast for 3 days, THEN 2 tablets (20 mg total) daily with breakfast for 3 days, THEN 1 tablet (10 mg total) daily with breakfast for 3 days. Start taking on: July 15, 2023 What changed:  See the new instructions. Another medication with the same name was removed. Continue taking this medication, and follow the directions you see here.   roflumilast  500 MCG Tabs tablet Commonly known as: DALIRESP  TAKE 1 TABLET BY MOUTH DAILY What changed: when to take this   rosuvastatin  20 MG tablet Commonly known as: CRESTOR  Take 1 tablet (20 mg total) by mouth daily. What changed:  when to take this   Spiriva  Respimat 2.5 MCG/ACT Aers Generic drug: Tiotropium Bromide  Monohydrate USE 2 INHALATIONS BY  MOUTH ONCE  DAILY   Symbicort  160-4.5 MCG/ACT inhaler Generic drug: budesonide -formoterol  USE 2 INHALATIONS BY MOUTH TWICE DAILY What changed: See the new instructions.   Systane Preservative Free 0.4-0.3 % Soln Generic drug: Polyethyl Glyc-Propyl Glyc PF Place 1 drop into both eyes 2 (two) times daily.   traMADol  50 MG tablet Commonly known as: ULTRAM  Take 50 mg by mouth every 12 (twelve) hours as needed for severe pain.               Durable Medical Equipment  (From admission, onward)           Start     Ordered   07/14/23 0814  For home use only DME Other see comment  Once       Comments: Home oral suctioning machine.  Question:  Length of Need  Answer:  Lifetime   07/14/23 0813              Discharge Care Instructions  (From admission, onward)           Start     Ordered   07/14/23 0000  Discharge wound care:       Comments: Cleanse arm skin tear with saline, pat dry. Cover with single layer of xeroform gauze, top with dry gauze or foam. Based on fragility of skin. Change every other day   07/14/23 1319            Follow-up Information     [facility] Follow up.   Why: ([facility])- home NIV/BIPAP arranged- they will contact you to deliver to the home and provide education on use Contact information: [address] [place] [address] [phone]                Allergies  Allergen Reactions   Tape Other (See Comments)    Skin tears easily   Augmentin  [Amoxicillin -Pot Clavulanate] Other (See Comments)    Thrush Sore throat Hoarse voice   Gadavist  [Gadobutrol ] Other (See Comments)    Unknown reaction   Gadolinium Itching, Rash and Other (See Comments)    Itchy back rash   Levaquin  [Levofloxacin ] Other (See Comments)    Hallucinations    Lipitor [Atorvastatin ] Rash    Discharge  Exam: Vitals:   07/14/23 1202 07/14/23 1327  BP: (!) 122/102   Pulse: (!) 103 (!) 111  Resp: 20 (!) 22  Temp: 97.7 F (36.5 C)   SpO2: 96% 94%    Physical Exam Vitals and nursing note reviewed.  Constitutional:      General: He is not in acute distress.    Appearance: He is not toxic-appearing.  HENT:     Head: Normocephalic and atraumatic.     Nose: Nose normal.  Cardiovascular:     Rate and Rhythm: Normal rate and regular rhythm.  Pulmonary:     Effort: Pulmonary effort is normal. No respiratory distress.     Breath sounds: No rales.     Comments: Scattered rhonchi Abdominal:     General: Bowel sounds are normal.     Palpations: Abdomen is soft.  Skin:    General: Skin is warm and dry.     Capillary Refill: Capillary refill takes less than 2 seconds.  Neurological:     Mental Status: He is alert.     The results of significant diagnostics from this hospitalization (including imaging, microbiology, ancillary and laboratory) are listed below for reference.    Microbiology: Recent Results (from the past 240 hours)  Resp panel by RT-PCR (RSV, Flu A&B, Covid)     Status: None   Collection Time: 07/09/23 10:01 AM   Specimen: Nasal Swab  Result Value Ref Range Status   SARS Coronavirus 2 by RT PCR NEGATIVE NEGATIVE Final   Influenza A by PCR NEGATIVE NEGATIVE Final   Influenza B by PCR NEGATIVE NEGATIVE Final    Comment: (NOTE) The Xpert Xpress SARS-CoV-2/FLU/RSV plus assay is intended as an aid in the diagnosis of influenza from Nasopharyngeal swab specimens and should not be used as a sole basis for treatment. Nasal washings and aspirates are unacceptable for Xpert Xpress SARS-CoV-2/FLU/RSV testing.  Fact Sheet for Patients: bloggercourse.com  Fact Sheet for Healthcare Providers: seriousbroker.it  This test is not yet approved or cleared by the [place]  FDA and has been authorized for detection and/or  diagnosis of SARS-CoV-2 by FDA under an Emergency Use Authorization (EUA). This EUA will remain in effect (meaning this test can be used) for the duration of the COVID-19 declaration under Section 564(b)(1) of the Act, 21 U.S.C. section 360bbb-3(b)(1), unless the authorization is terminated or revoked.     Resp Syncytial Virus by PCR NEGATIVE NEGATIVE Final    Comment: (NOTE) Fact Sheet for Patients: bloggercourse.com  Fact Sheet for Healthcare Providers: seriousbroker.it  This test is not yet approved or cleared by the [place]  FDA and has been authorized for detection and/or diagnosis of SARS-CoV-2 by FDA under an Emergency Use Authorization (EUA). This EUA will remain in effect (meaning this test can be used) for the duration of the COVID-19 declaration under Section 564(b)(1) of the Act, 21 U.S.C. section 360bbb-3(b)(1), unless the authorization is terminated or revoked.  Performed at [facility], [address], [place], [place] [address]   Culture, blood (routine x 2)     Status: None   Collection Time: 07/09/23 10:03 AM   Specimen: BLOOD RIGHT WRIST  Result Value Ref Range Status   Specimen Description BLOOD RIGHT WRIST  Final   Special Requests   Final    BOTTLES DRAWN AEROBIC AND ANAEROBIC Blood Culture results may not be optimal due to an inadequate volume of blood received in culture bottles   Culture   Final    NO GROWTH 5 DAYS Performed at [facility], [address] [place], [place] [address]    Report Status 07/14/2023 FINAL  Final  Culture, blood (routine x 2)     Status: None   Collection Time: 07/09/23 10:25 AM   Specimen: BLOOD RIGHT HAND  Result Value Ref Range Status   Specimen Description BLOOD RIGHT HAND  Final   Special Requests   Final    BOTTLES DRAWN AEROBIC ONLY Blood Culture results may not be optimal due to an inadequate volume of blood received in culture bottles   Culture    Final    NO GROWTH 5 DAYS Performed at [facility], [address], [place], [place] [address]    Report Status 07/14/2023 FINAL  Final  Respiratory (~20 pathogens) panel by PCR     Status: None   Collection Time: 07/09/23  3:03 PM   Specimen: Nasopharyngeal Swab; Respiratory  Result Value Ref Range Status   Adenovirus NOT DETECTED NOT DETECTED Final   Coronavirus 229E NOT DETECTED NOT DETECTED Final    Comment: (NOTE) The Coronavirus on the Respiratory Panel, DOES NOT test for the novel  Coronavirus (2019 nCoV)    Coronavirus HKU1 NOT DETECTED NOT DETECTED Final  Coronavirus NL63 NOT DETECTED NOT DETECTED Final   Coronavirus OC43 NOT DETECTED NOT DETECTED Final   Metapneumovirus NOT DETECTED NOT DETECTED Final   Rhinovirus / Enterovirus NOT DETECTED NOT DETECTED Final   Influenza A NOT DETECTED NOT DETECTED Final   Influenza B NOT DETECTED NOT DETECTED Final   Parainfluenza Virus 1 NOT DETECTED NOT DETECTED Final   Parainfluenza Virus 2 NOT DETECTED NOT DETECTED Final   Parainfluenza Virus 3 NOT DETECTED NOT DETECTED Final   Parainfluenza Virus 4 NOT DETECTED NOT DETECTED Final   Respiratory Syncytial Virus NOT DETECTED NOT DETECTED Final   Bordetella pertussis NOT DETECTED NOT DETECTED Final   Bordetella Parapertussis NOT DETECTED NOT DETECTED Final   Chlamydophila pneumoniae NOT DETECTED NOT DETECTED Final   Mycoplasma pneumoniae NOT DETECTED NOT DETECTED Final    Comment: Performed at [facility], [address], [place], [place] [address]  MRSA Next Gen by PCR, Nasal     Status: None   Collection Time: 07/10/23  3:26 PM   Specimen: Nasal Mucosa; Nasal Swab  Result Value Ref Range Status   MRSA by PCR Next Gen NOT DETECTED NOT DETECTED Final    Comment: (NOTE) The GeneXpert MRSA Assay (FDA approved for NASAL specimens only), is one component of a comprehensive MRSA colonization surveillance program. It is not intended to diagnose MRSA infection nor to  guide or monitor treatment for MRSA infections. Test performance is not FDA approved in patients less than 62 years old. Performed at [facility], [address] [place], [place] [address]   Expectorated Sputum Assessment w Gram Stain, Rflx to Resp Cult     Status: None   Collection Time: 07/12/23 11:23 AM   Specimen: Sputum  Result Value Ref Range Status   Specimen Description SPUTUM  Final   Special Requests NONE  Final   Sputum evaluation   Final    Sputum specimen not acceptable for testing.  Please recollect.   Results Called to: RN ANTONIOS OSTROW [id number] BY JINNY COMMON, [place] Performed at [facility], [address], [place], [place] [address]    Report Status 07/12/2023 FINAL  Final  Expectorated Sputum Assessment w Gram Stain, Rflx to Resp Cult     Status: None   Collection Time: 07/13/23  3:11 AM   Specimen: SPU  Result Value Ref Range Status   Specimen Description SPUTUM  Final   Special Requests NONE  Final   Sputum evaluation   Final    Sputum specimen not acceptable for testing.  Please recollect.   RESULT CALLED TO, READ BACK BY AND VERIFIED WITH: DAEJA,RN@[email or web address]  07/13/23 MK Performed at [facility], [address], [place], [place] [address]    Report Status 07/13/2023 FINAL  Final  Expectorated Sputum Assessment w Gram Stain, Rflx to Resp Cult     Status: None   Collection Time: 07/13/23  9:52 AM   Specimen: Sputum  Result Value Ref Range Status   Specimen Description SPUTUM  Final   Special Requests NONE  Final   Sputum evaluation   Final    THIS SPECIMEN IS ACCEPTABLE FOR SPUTUM CULTURE Performed at [facility], [address] [place], [place] [address]    Report Status 07/13/2023 FINAL  Final  Culture, Respiratory w Gram Stain     Status: None (Preliminary result)   Collection Time: 07/13/23  9:52 AM   Specimen: SPU  Result Value Ref Range Status  Specimen Description SPUTUM  Final   Special Requests NONE Reflexed from [id number]   Final   Gram Stain   Final    FEW SQUAMOUS EPITHELIAL CELLS PRESENT FEW WBC PRESENT,BOTH PMN AND MONONUCLEAR MODERATE GRAM VARIABLE ROD FEW GRAM POSITIVE COCCI IN CLUSTERS Performed at [facility], [address] [place], [place] [address]    Culture PENDING  Incomplete   Report Status PENDING  Incomplete     Labs: BNP (last 3 results) Recent Labs    08/14/22 0617 02/20/23 1617 07/11/23 0639  BNP 143.2* 86.0 221.1*   Basic Metabolic Panel: Recent Labs  Lab 07/09/23 1001 07/10/23 0643 07/11/23 0639 07/12/23 0425 07/13/23 0444 07/14/23 0440  NA 141 140  --  139 137 140  K 4.2 5.1  --  4.4 4.1 4.0  CL 89* 90*  --  92* 90* 91*  CO2 42* 43*  --  36* 39* 40*  GLUCOSE 128* 117*  --  121* 102* 108*  BUN 15 17  --  22 20 19   CREATININE 0.99 0.99  --  0.92 0.98 1.01  CALCIUM  9.4 9.0  --  8.1* 8.0* 7.8*  MG  --   --  2.4 2.3 2.2 2.0  PHOS  --   --  2.3*  --   --   --    Liver Function Tests: Recent Labs  Lab 07/10/23 0643 07/12/23 0425 07/13/23 0444 07/14/23 0440  AST 22 26 27 29   ALT 16 17 19 23   ALKPHOS 43 38 35* 41  BILITOT 0.5 0.6 0.4 0.5  PROT 5.8* 5.2* 4.8* 4.9*  ALBUMIN  3.1* 2.8* 2.6* 2.6*   CBC: Recent Labs  Lab 07/09/23 1001 07/10/23 0643 07/12/23 0425 07/13/23 0444 07/13/23 2203 07/14/23 0440  WBC 10.4 3.6* 4.3 5.4  --  6.6  NEUTROABS  --   --  4.1 4.1  --  5.3  HGB 9.2* 8.5* 7.9* 7.4* 10.8* 10.3*  HCT 31.0* 28.8* 25.6* 23.8* 34.4* 32.5*  MCV 100.6* 100.0 96.2 98.3  --  92.1  PLT 167 141* 122* 108*  --  100*   BNP: Recent Labs  Lab 07/11/23 0639  BNP 221.1*   Urinalysis    Component Value Date/Time   COLORURINE YELLOW 02/21/2023 0710   APPEARANCEUR CLEAR 02/21/2023 0710   LABSPEC 1.012 02/21/2023 0710   PHURINE 7.0 02/21/2023 0710   GLUCOSEU NEGATIVE 02/21/2023 0710   GLUCOSEU NEGATIVE 06/25/2017 0831   HGBUR NEGATIVE 02/21/2023 0710   HGBUR trace-lysed 02/12/2010 0819   BILIRUBINUR NEGATIVE 02/21/2023 0710   BILIRUBINUR  Negative 12/25/2017 0852   KETONESUR 5 (A) 02/21/2023 0710   PROTEINUR NEGATIVE 02/21/2023 0710   UROBILINOGEN 0.2 12/25/2017 0852   UROBILINOGEN 0.2 06/25/2017 0831   NITRITE NEGATIVE 02/21/2023 0710   LEUKOCYTESUR NEGATIVE 02/21/2023 0710   Sepsis Labs Recent Labs  Lab 07/10/23 0643 07/12/23 0425 07/13/23 0444 07/14/23 0440  WBC 3.6* 4.3 5.4 6.6   Microbiology Recent Results (from the past 240 hours)  Resp panel by RT-PCR (RSV, Flu A&B, Covid)     Status: None   Collection Time: 07/09/23 10:01 AM   Specimen: Nasal Swab  Result Value Ref Range Status   SARS Coronavirus 2 by RT PCR NEGATIVE NEGATIVE Final   Influenza A by PCR NEGATIVE NEGATIVE Final   Influenza B by PCR NEGATIVE NEGATIVE Final    Comment: (NOTE) The Xpert Xpress SARS-CoV-2/FLU/RSV plus assay is intended as an aid in the diagnosis of influenza from Nasopharyngeal swab specimens  and should not be used as a sole basis for treatment. Nasal washings and aspirates are unacceptable for Xpert Xpress SARS-CoV-2/FLU/RSV testing.  Fact Sheet for Patients: bloggercourse.com  Fact Sheet for Healthcare Providers: seriousbroker.it  This test is not yet approved or cleared by the [place]  FDA and has been authorized for detection and/or diagnosis of SARS-CoV-2 by FDA under an Emergency Use Authorization (EUA). This EUA will remain in effect (meaning this test can be used) for the duration of the COVID-19 declaration under Section 564(b)(1) of the Act, 21 U.S.C. section 360bbb-3(b)(1), unless the authorization is terminated or revoked.     Resp Syncytial Virus by PCR NEGATIVE NEGATIVE Final    Comment: (NOTE) Fact Sheet for Patients: bloggercourse.com  Fact Sheet for Healthcare Providers: seriousbroker.it  This test is not yet approved or cleared by the [place]  FDA and has been authorized for detection  and/or diagnosis of SARS-CoV-2 by FDA under an Emergency Use Authorization (EUA). This EUA will remain in effect (meaning this test can be used) for the duration of the COVID-19 declaration under Section 564(b)(1) of the Act, 21 U.S.C. section 360bbb-3(b)(1), unless the authorization is terminated or revoked.  Performed at [facility], [address], [place], [place] [address]   Culture, blood (routine x 2)     Status: None   Collection Time: 07/09/23 10:03 AM   Specimen: BLOOD RIGHT WRIST  Result Value Ref Range Status   Specimen Description BLOOD RIGHT WRIST  Final   Special Requests   Final    BOTTLES DRAWN AEROBIC AND ANAEROBIC Blood Culture results may not be optimal due to an inadequate volume of blood received in culture bottles   Culture   Final    NO GROWTH 5 DAYS Performed at [facility], [address] [place], [place] [address]    Report Status 07/14/2023 FINAL  Final  Culture, blood (routine x 2)     Status: None   Collection Time: 07/09/23 10:25 AM   Specimen: BLOOD RIGHT HAND  Result Value Ref Range Status   Specimen Description BLOOD RIGHT HAND  Final   Special Requests   Final    BOTTLES DRAWN AEROBIC ONLY Blood Culture results may not be optimal due to an inadequate volume of blood received in culture bottles   Culture   Final    NO GROWTH 5 DAYS Performed at [facility], [address] [place], [place] [address]    Report Status 07/14/2023 FINAL  Final  Respiratory (~20 pathogens) panel by PCR     Status: None   Collection Time: 07/09/23  3:03 PM   Specimen: Nasopharyngeal Swab; Respiratory  Result Value Ref Range Status   Adenovirus NOT DETECTED NOT DETECTED Final   Coronavirus 229E NOT DETECTED NOT DETECTED Final    Comment: (NOTE) The Coronavirus on the Respiratory Panel, DOES NOT test for the novel  Coronavirus (2019 nCoV)    Coronavirus HKU1 NOT DETECTED NOT DETECTED Final   Coronavirus NL63 NOT DETECTED NOT DETECTED Final    Coronavirus OC43 NOT DETECTED NOT DETECTED Final   Metapneumovirus NOT DETECTED NOT DETECTED Final   Rhinovirus / Enterovirus NOT DETECTED NOT DETECTED Final   Influenza A NOT DETECTED NOT DETECTED Final   Influenza B NOT DETECTED NOT DETECTED Final   Parainfluenza Virus 1 NOT DETECTED NOT DETECTED Final   Parainfluenza Virus 2 NOT DETECTED NOT DETECTED Final   Parainfluenza Virus 3 NOT DETECTED NOT DETECTED Final   Parainfluenza  Virus 4 NOT DETECTED NOT DETECTED Final   Respiratory Syncytial Virus NOT DETECTED NOT DETECTED Final   Bordetella pertussis NOT DETECTED NOT DETECTED Final   Bordetella Parapertussis NOT DETECTED NOT DETECTED Final   Chlamydophila pneumoniae NOT DETECTED NOT DETECTED Final   Mycoplasma pneumoniae NOT DETECTED NOT DETECTED Final    Comment: Performed at [facility], [address] [place], [place] [address]  MRSA Next Gen by PCR, Nasal     Status: None   Collection Time: 07/10/23  3:26 PM   Specimen: Nasal Mucosa; Nasal Swab  Result Value Ref Range Status   MRSA by PCR Next Gen NOT DETECTED NOT DETECTED Final    Comment: (NOTE) The GeneXpert MRSA Assay (FDA approved for NASAL specimens only), is one component of a comprehensive MRSA colonization surveillance program. It is not intended to diagnose MRSA infection nor to guide or monitor treatment for MRSA infections. Test performance is not FDA approved in patients less than 65 years old. Performed at [facility], [address] [place], [place] [address]   Expectorated Sputum Assessment w Gram Stain, Rflx to Resp Cult     Status: None   Collection Time: 07/12/23 11:23 AM   Specimen: Sputum  Result Value Ref Range Status   Specimen Description SPUTUM  Final   Special Requests NONE  Final   Sputum evaluation   Final    Sputum specimen not acceptable for testing.  Please recollect.   Results Called to: RN JAXSIN BOTTOMLEY [id number] BY JINNY COMMON, [place] Performed at [facility],  [address], [place], [place] [address]    Report Status 07/12/2023 FINAL  Final  Expectorated Sputum Assessment w Gram Stain, Rflx to Resp Cult     Status: None   Collection Time: 07/13/23  3:11 AM   Specimen: SPU  Result Value Ref Range Status   Specimen Description SPUTUM  Final   Special Requests NONE  Final   Sputum evaluation   Final    Sputum specimen not acceptable for testing.  Please recollect.   RESULT CALLED TO, READ BACK BY AND VERIFIED WITH: DAEJA,RN@[email or web address]  07/13/23 MK Performed at [facility], [address] [place], [place] [address]    Report Status 07/13/2023 FINAL  Final  Expectorated Sputum Assessment w Gram Stain, Rflx to Resp Cult     Status: None   Collection Time: 07/13/23  9:52 AM   Specimen: Sputum  Result Value Ref Range Status   Specimen Description SPUTUM  Final   Special Requests NONE  Final   Sputum evaluation   Final    THIS SPECIMEN IS ACCEPTABLE FOR SPUTUM CULTURE Performed at [facility], [address], [place], [place] [address]    Report Status 07/13/2023 FINAL  Final  Culture, Respiratory w Gram Stain     Status: None (Preliminary result)   Collection Time: 07/13/23  9:52 AM   Specimen: SPU  Result Value Ref Range Status   Specimen Description SPUTUM  Final   Special Requests NONE Reflexed from [id number]  Final   Gram Stain   Final    FEW SQUAMOUS EPITHELIAL CELLS PRESENT FEW WBC PRESENT,BOTH PMN AND MONONUCLEAR MODERATE GRAM VARIABLE ROD FEW GRAM POSITIVE COCCI IN CLUSTERS Performed at [facility], [address] [place], [place] [address]    Culture PENDING  Incomplete   Report Status PENDING  Incomplete    Procedures/Studies: CT CHEST W CONTRAST Result Date: 07/12/2023 CLINICAL  DATA:  Evaluate for pneumonia. History of small cell lung cancer. * Tracking Code: BO * EXAM: CT CHEST WITH CONTRAST TECHNIQUE: Multidetector CT imaging of the chest was performed during intravenous contrast administration. RADIATION DOSE  REDUCTION: This exam was performed according to the departmental dose-optimization program which includes automated exposure control, adjustment of the mA and/or kV according to patient size and/or use of iterative reconstruction technique. CONTRAST:  75mL OMNIPAQUE  IOHEXOL  350 MG/ML SOLN COMPARISON:  12/24/2022 FINDINGS: Cardiovascular: Normal heart size. No pericardial effusion. Aortic atherosclerosis. Coronary artery calcifications. Mediastinum/Nodes: Thyroid  gland, trachea, and esophagus appear normal. No enlarged supraclavicular, axillary, mediastinal, or hilar lymph nodes. Lungs/Pleura: Small bilateral pleural effusions. Severe changes of emphysema with diffuse bronchial wall thickening. Scarring and architectural distortion with volume loss is noted within the right upper lobe extending into the apex. This is stable in appearance when compared with the previous exam. Within the right midlung there is perihilar fibrosis and architectural distortion which is unchanged from the previous exam and is compatible with post treatment changes. A small fiducial marker is identified within this area, image 64/5. Chronic scarring within the right lung base is also unchanged in the interval, image 99/5. No superimposed acute airspace consolidation to suggest pneumonia. Nodule within the posterior right apex is stable measuring 6 mm, image 20/5. Upper Abdomen: No acute abnormality. 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 No follow-up imaging recommended. Cystic lesion arising off the tail of the pancreas is again noted. This measures 2.4 x 1.9 cm, image 168/3. Previously this measured the same. Musculoskeletal: The bones are diffusely osteopenic. Extensive multifocal compression fractures are identified within the thoracic spine. Compression fractures involving the T6, T7 and T8 vertebra are stable compared with 12/24/2022. There are new fracture deformities involving the T11 and T12  vertebra with loss of approximately 30% of the vertebral body heights at these levels. Compression fracture involving the L2 vertebra is unchanged from 12/24/2022. IMPRESSION: 1. No acute cardiopulmonary abnormalities. No signs of pneumonia. 2. Small bilateral pleural effusions. New from previous CT from 12/24/2022. 3. Stable appearance of post treatment changes within the right lung. 4. Stable appearance of 6 mm nodule within the posterior right apex. 5. Stable appearance of cystic lesion arising off the tail of the pancreas. See report from 07/27/2022 for follow-up recommendations. 6. Extensive multifocal compression fractures are identified within the thoracic spine. Compression fractures involving the T6, T7 and T8 vertebra are stable compared with 12/24/2022. There are new fracture deformities involving the T11 and T12 vertebra with loss of approximately 30% of the vertebral body heights at these levels. Compression fracture involving the L2 vertebra is unchanged from 12/24/2022. 7. Coronary artery calcifications. 8. Aortic Atherosclerosis (ICD10-I70.0) and Emphysema (ICD10-J43.9). Electronically Signed   By: Waddell Calk M.D.   On: 07/12/2023 17:12   DG Chest Portable 1 View Result Date: 07/09/2023 CLINICAL DATA:  COPD, shortness of breath, cough EXAM: PORTABLE CHEST - 1 VIEW COMPARISON:  02/20/2023 FINDINGS: Chronic biapical pleuroparenchymal scarring. Somewhat coarse pulmonary interstitial markings as before. Fiducial marker projects over the right hilum as before. No new infiltrate or overt edema. Heart size and mediastinal contours are within normal limits. Aortic Atherosclerosis (ICD10-170.0). No effusion. Visualized bones unremarkable. IMPRESSION: Chronic changes without acute findings. Electronically Signed   By: JONETTA Faes M.D.   On: 07/09/2023 10:11    Time coordinating discharge: 50 mins  SIGNED:  Camellia Door, DO [facility] 07/14/23, 1:45 PM

## 2023-07-14 NOTE — Telephone Encounter (Signed)
 Patient scheduled 08/14/2023 with Ames Dura NP.

## 2023-07-14 NOTE — Progress Notes (Signed)
 PROGRESS NOTE    Francisco [name]  FMW:[id number] DOB: December 13, 1945 DOA: 07/09/2023 PCP: [name], MD  Subjective: Pt seen and examined.  pt's wife at bedside Pt transfused 2 units PRBC yesterday. HgB up to 10.3 g/dl today. Pt feeling better.  Awaiting approval of home NIV machine.   Hospital Course: HPI: Francisco [name] is a 78 y.o. male with medical history significant of hypertension, hyperlipidemia, chronic CHF, BPH, PAD, anemia, GI AVM, compression fracture, essential tremor, chronic respiratory failure with hypoxia, severe COPD, pulmonary embolism, small cell lung cancer presenting with worsening shortness of breath and cough.   Patient has had a couple weeks of cough which worsened today and associated significant shortness of breath today.  Had been evaluated by pulmonology by telephone visit and been prescribed prednisone  taper and ciprofloxacin .  Which she has started.  Is chronically on 10 mg prednisone  daily.   Today with worsening systems he took his 40 p.o. prednisone  prescribed by pulmonologist and 4 puffs of albuterol  without improvement.  Received nebulizer and route with EMS with some improvement.  Had been desaturating as low as the 70s on home 4 L which she typically takes with exertion.   Denies fevers, chills, chest pain, abdominal pain, constipation, diarrhea, nausea, vomiting.  Significant Events: Admitted 07/09/2023 for COPD exacerbation, acute on chronic respiratory failure with hypoxia and hypercapnia   Significant Labs: chloride 89, bicarb stable 42. CBC with hemoglobin stable at 9.2. Lactic acid borderline at 1.9 and then 2.2 on repeat. Respiratory panel for flu COVID RSV negative.   Significant Imaging Studies: Chest x-ray with stable chronic changes and no acute abnormality.   Antibiotic Therapy: Anti-infectives (From admission, onward)    Start     Dose/Rate Route Frequency Ordered Stop   07/10/23 1000  azithromycin  (ZITHROMAX ) tablet 500  mg        500 mg Oral Daily 07/09/23 1503     07/09/23 1430  azithromycin  (ZITHROMAX ) 500 mg in sodium chloride  0.9 % 250 mL IVPB        500 mg 250 mL/hr over 60 Minutes Intravenous  Once 07/09/23 1423 07/09/23 1531       Procedures:   Consultants:     Assessment and Plan: * Acute on chronic respiratory failure with hypoxia and hypercapnia (HCC) 07-10-2023 VBG obtained this AM. pH 7.3, PCO2 99, bicarb 49. Has both acute and chronic respiratory acidosis with metabolic alkalosis.  PCCM consulted. May need NIV at home. Discussed at length with pt's wife and dtr. Placed on NIV per PCCM. Keep NPO for now. 07-11-2023 respiratory status stable enough this AM to start diet.  Continue with NIV during sleep.  Baseline PCO2 appears to be in the low 70s.  pH this AM 7.42 07-12-2023 ABG on NIV this AM shows pH 7.4 with PCO2 of 68. TOC working on obtaining NIV for home use. 07-13-2023 pt needs ABG on NIV to document his need for home NIV machine. Getting this done today.  07-14-2023 ABG on NIV yesterday showed pH 7.34, PCO2 of 83. Need home NIV machine. Awaiting TOC/HH to get this arranged. He cannot leave hospital until he has this machine.  Acute blood loss anemia (ABLA) 07-13-2023 likely due to skin tears and concomitant systemic anticoagulation with Eliquis . Will transfuse with 2 unit PRBC. Give 40 mg IV lasix  after 1 units PRBC. 07-14-2023 HgB 10.3 g/dl after transfusion.  COPD with acute exacerbation (HCC) 07-10-2023 defer management to PCCM. He was placed on IV steroids, IV  ceftaz and IV vanco. On NIV. PCCM wrote to change to IV steroids. Pt received 125 mg IV solumedrol yesterday AM. Will continue with 80 mg q12h IV solumedrol. Continue with NIV. Will ask CM to look into home NIV for him. 07/12/2023 no wheezing. Change to po prednisone . PCCM to manage IV Abx. Would advocate in stopping abx. 07-13-2023 discussed with PCCM yesterday. Have stopped all IV Abx. Change back to MWF zithromax . Now  on po prednisone .  07-14-2023 doing well with po prednisone  40 mg daily. On duonebs tid.  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 now on IV Ceftaz and vanco per PCCM. 07/11/2023 continue IV ceftax and vanco per [facility] 07/12/2023 defer abx management to PCCM. WBC 4.3, no fevers. Checking CT chest. 07-13-2023 discussed with PCCM. Have stopped all IV abx. Only on MWF zithromax  to help with his sputum production. 07-14-2023 stable.  Chronic systolic congestive heart failure (HCC) 07/10/2023 stable. Holding lasix  for now as this could contribute to alkalosis. 07-11-2023 stable. Repeat BMP in AM. 07/12/2023 his metabolic alkalosis is improving with NIV treatment. 07-13-2023 continue to hold scheduled lasix . 07-14-2023 don't think he needs scheduled lasix  at discharge.  Anemia 07/10/2023 stable. 07-11-2023 stable 07-12-2023 HgB 7.9 today.  Was 9.2 3 days ago. Had skin tear on left arm on admission. Wound care consult. 07-13-2023 see acute blood loss anemia. Getting 2u PRBC transfusion today. 07-14-2023 post-transfusion HgB 10.3 g/dl  H/O arteriovenous malformation (AVM) 07/10/2023 stable. No evidence of bleeding. 07-11-2023 thru 07-14-2023 stable  Malignant neoplasm of lower lobe of right lung (HCC) 07/10/2023 stable. 07-11-2023 stable 07-12-2023 pt was scheduled for CT chest with IV contrast as followup. Will check CT chest with IV contrast per family request. 07-13-2023 CT chest with IV contrast completed per family request. Will forward results to his oncologist.  PAD (peripheral artery disease) (HCC) 07/10/2023 remains on crestor  and eliquis . 07-11-2023 thru 07-14-2023 stable  Chronic pulmonary embolism (HCC) 07/10/2023 continue with Eliquis  2.5 mg bid. 07-11-2023 thru 07-14-2023 stable  Essential hypertension 07/10/2023 stable 07-11-2023 thru 07-14-2023 stable  BPH associated with nocturia 07/10/2023 chronic 07-11-2023 thru 07-14-2023  stable  Hyperlipidemia 07/10/2023 continue crestor  20 mg qpm. 07-11-2023 thru 07-14-2023 stable  Skin tear of elbow without complication, left, initial encounter 07/10/2023 present on admission. Will get wound care consult. 07-13-2023 wound care has seen patient. No new changes.  DNR (do not resuscitate) 07/10/2023 PCCM discussed with pt, wife and dtr. Pt was made DNR/DNI.  Chronic respiratory failure with hypoxia and hypercapnia (HCC) - baseline PCO2 in the 70s 07/10/2023 evidence of chronic CO2 retention with VBG showing pH of 7.3 and PCO2 99.  Pt may need NIV at home/discharge. 07/11/2023 baseline PCO2 in the low 70s. This AM pH 7.42. CM looking into home NIV. 07/12/2023 ABG on NIV this AM. pH 7.4, PCO2 of 68. TOC working on home NIV. 07-13-2023 pt needs ABG on NIV to document his need for home NIV machine. Getting this done today.   07-14-2023 ABG on NIV yesterday showed pH 7.34, PCO2 of 83. Need home NIV machine. Awaiting TOC/HH to get this arranged. He cannot leave hospital until he has this machine.    DVT prophylaxis: apixaban  (ELIQUIS ) tablet 2.5 mg Start: 07/09/23 2200 apixaban  (ELIQUIS ) tablet 2.5 mg     Code Status: Limited: Do not attempt resuscitation (DNR) -DNR-LIMITED -Do Not Intubate/DNI  Family Communication: discussed with pt and pt's wife Disposition Plan: return home Reason for continuing need for hospitalization: medically stable. Can go home when home NIV machine has  been secured.  Objective: Vitals:   07/13/23 2358 07/14/23 0238 07/14/23 0301 07/14/23 0736  BP: 98/76  95/62 131/84  Pulse: (!) 104  95 96  Resp: 20 20 20 19   Temp: 97.8 F (36.6 C)  98.2 F (36.8 C) 98.1 F (36.7 C)  TempSrc: Axillary  Oral Axillary  SpO2: 94% 92% 96% 93%    Intake/Output Summary (Last 24 hours) at 07/14/2023 1020 Last data filed at 07/14/2023 0301 Gross per 24 hour  Intake 975.83 ml  Output 2200 ml  Net -1224.17 ml   There were no vitals filed for this  visit.  Examination:  Physical Exam Vitals and nursing note reviewed.  Constitutional:      General: He is not in acute distress.    Appearance: He is not toxic-appearing.  HENT:     Head: Normocephalic and atraumatic.     Nose: Nose normal.  Cardiovascular:     Rate and Rhythm: Normal rate and regular rhythm.  Pulmonary:     Effort: Pulmonary effort is normal. No respiratory distress.     Breath sounds: No rales.     Comments: Scattered rhonchi Abdominal:     General: Bowel sounds are normal.     Palpations: Abdomen is soft.  Skin:    General: Skin is warm and dry.     Capillary Refill: Capillary refill takes less than 2 seconds.  Neurological:     Mental Status: He is alert.    Data Reviewed: I have personally reviewed following labs and imaging studies  CBC: Recent Labs  Lab 07/09/23 1001 07/10/23 0643 07/12/23 0425 07/13/23 0444 07/13/23 2203 07/14/23 0440  WBC 10.4 3.6* 4.3 5.4  --  6.6  NEUTROABS  --   --  4.1 4.1  --  5.3  HGB 9.2* 8.5* 7.9* 7.4* 10.8* 10.3*  HCT 31.0* 28.8* 25.6* 23.8* 34.4* 32.5*  MCV 100.6* 100.0 96.2 98.3  --  92.1  PLT 167 141* 122* 108*  --  100*   Basic Metabolic Panel: Recent Labs  Lab 07/09/23 1001 07/10/23 0643 07/11/23 0639 07/12/23 0425 07/13/23 0444 07/14/23 0440  NA 141 140  --  139 137 140  K 4.2 5.1  --  4.4 4.1 4.0  CL 89* 90*  --  92* 90* 91*  CO2 42* 43*  --  36* 39* 40*  GLUCOSE 128* 117*  --  121* 102* 108*  BUN 15 17  --  22 20 19   CREATININE 0.99 0.99  --  0.92 0.98 1.01  CALCIUM  9.4 9.0  --  8.1* 8.0* 7.8*  MG  --   --  2.4 2.3 2.2 2.0  PHOS  --   --  2.3*  --   --   --    GFR: CrCl cannot be calculated (Unknown ideal weight.). Liver Function Tests: Recent Labs  Lab 07/10/23 0643 07/12/23 0425 07/13/23 0444 07/14/23 0440  AST 22 26 27 29   ALT 16 17 19 23   ALKPHOS 43 38 35* 41  BILITOT 0.5 0.6 0.4 0.5  PROT 5.8* 5.2* 4.8* 4.9*  ALBUMIN  3.1* 2.8* 2.6* 2.6*   BNP (last 3 results) Recent  Labs    08/14/22 0617 02/20/23 1617 07/11/23 0639  BNP 143.2* 86.0 221.1*   Sepsis Labs: Recent Labs  Lab 07/09/23 1001 07/09/23 1249 07/11/23 0639  PROCALCITON  --   --  <0.10  LATICACIDVEN 1.9 2.2*  --     Recent Results (from the past 240 hours)  Resp panel  by RT-PCR (RSV, Flu A&B, Covid)     Status: None   Collection Time: 07/09/23 10:01 AM   Specimen: Nasal Swab  Result Value Ref Range Status   SARS Coronavirus 2 by RT PCR NEGATIVE NEGATIVE Final   Influenza A by PCR NEGATIVE NEGATIVE Final   Influenza B by PCR NEGATIVE NEGATIVE Final    Comment: (NOTE) The Xpert Xpress SARS-CoV-2/FLU/RSV plus assay is intended as an aid in the diagnosis of influenza from Nasopharyngeal swab specimens and should not be used as a sole basis for treatment. Nasal washings and aspirates are unacceptable for Xpert Xpress SARS-CoV-2/FLU/RSV testing.  Fact Sheet for Patients: bloggercourse.com  Fact Sheet for Healthcare Providers: seriousbroker.it  This test is not yet approved or cleared by the [place]  FDA and has been authorized for detection and/or diagnosis of SARS-CoV-2 by FDA under an Emergency Use Authorization (EUA). This EUA will remain in effect (meaning this test can be used) for the duration of the COVID-19 declaration under Section 564(b)(1) of the Act, 21 U.S.C. section 360bbb-3(b)(1), unless the authorization is terminated or revoked.     Resp Syncytial Virus by PCR NEGATIVE NEGATIVE Final    Comment: (NOTE) Fact Sheet for Patients: bloggercourse.com  Fact Sheet for Healthcare Providers: seriousbroker.it  This test is not yet approved or cleared by the [place]  FDA and has been authorized for detection and/or diagnosis of SARS-CoV-2 by FDA under an Emergency Use Authorization (EUA). This EUA will remain in effect (meaning this test can be used) for the  duration of the COVID-19 declaration under Section 564(b)(1) of the Act, 21 U.S.C. section 360bbb-3(b)(1), unless the authorization is terminated or revoked.  Performed at [facility], [address], [place], [place] [address]   Culture, blood (routine x 2)     Status: None   Collection Time: 07/09/23 10:03 AM   Specimen: BLOOD RIGHT WRIST  Result Value Ref Range Status   Specimen Description BLOOD RIGHT WRIST  Final   Special Requests   Final    BOTTLES DRAWN AEROBIC AND ANAEROBIC Blood Culture results may not be optimal due to an inadequate volume of blood received in culture bottles   Culture   Final    NO GROWTH 5 DAYS Performed at [facility], [address] [place], [place] [address]    Report Status 07/14/2023 FINAL  Final  Culture, blood (routine x 2)     Status: None   Collection Time: 07/09/23 10:25 AM   Specimen: BLOOD RIGHT HAND  Result Value Ref Range Status   Specimen Description BLOOD RIGHT HAND  Final   Special Requests   Final    BOTTLES DRAWN AEROBIC ONLY Blood Culture results may not be optimal due to an inadequate volume of blood received in culture bottles   Culture   Final    NO GROWTH 5 DAYS Performed at [facility], [address], [place], [place] [address]    Report Status 07/14/2023 FINAL  Final  Respiratory (~20 pathogens) panel by PCR     Status: None   Collection Time: 07/09/23  3:03 PM   Specimen: Nasopharyngeal Swab; Respiratory  Result Value Ref Range Status   Adenovirus NOT DETECTED NOT DETECTED Final   Coronavirus 229E NOT DETECTED NOT DETECTED Final    Comment: (NOTE) The Coronavirus on the Respiratory Panel, DOES NOT test for the novel  Coronavirus (2019 nCoV)    Coronavirus HKU1 NOT DETECTED NOT DETECTED Final   Coronavirus NL63  NOT DETECTED NOT DETECTED Final   Coronavirus OC43 NOT DETECTED NOT DETECTED Final   Metapneumovirus NOT DETECTED NOT DETECTED Final   Rhinovirus / Enterovirus NOT DETECTED NOT DETECTED  Final   Influenza A NOT DETECTED NOT DETECTED Final   Influenza B NOT DETECTED NOT DETECTED Final   Parainfluenza Virus 1 NOT DETECTED NOT DETECTED Final   Parainfluenza Virus 2 NOT DETECTED NOT DETECTED Final   Parainfluenza Virus 3 NOT DETECTED NOT DETECTED Final   Parainfluenza Virus 4 NOT DETECTED NOT DETECTED Final   Respiratory Syncytial Virus NOT DETECTED NOT DETECTED Final   Bordetella pertussis NOT DETECTED NOT DETECTED Final   Bordetella Parapertussis NOT DETECTED NOT DETECTED Final   Chlamydophila pneumoniae NOT DETECTED NOT DETECTED Final   Mycoplasma pneumoniae NOT DETECTED NOT DETECTED Final    Comment: Performed at [facility], [address], [place], [place] [address]  MRSA Next Gen by PCR, Nasal     Status: None   Collection Time: 07/10/23  3:26 PM   Specimen: Nasal Mucosa; Nasal Swab  Result Value Ref Range Status   MRSA by PCR Next Gen NOT DETECTED NOT DETECTED Final    Comment: (NOTE) The GeneXpert MRSA Assay (FDA approved for NASAL specimens only), is one component of a comprehensive MRSA colonization surveillance program. It is not intended to diagnose MRSA infection nor to guide or monitor treatment for MRSA infections. Test performance is not FDA approved in patients less than 67 years old. Performed at [facility], [address] [place], [place] [address]   Expectorated Sputum Assessment w Gram Stain, Rflx to Resp Cult     Status: None   Collection Time: 07/12/23 11:23 AM   Specimen: Sputum  Result Value Ref Range Status   Specimen Description SPUTUM  Final   Special Requests NONE  Final   Sputum evaluation   Final    Sputum specimen not acceptable for testing.  Please recollect.   Results Called to: RN WAHID HOLLEY [id number] BY JINNY COMMON, [place] Performed at [facility], [address] [place], [place] [address]    Report Status 07/12/2023 FINAL  Final  Expectorated Sputum Assessment w Gram Stain, Rflx to Resp Cult     Status:  None   Collection Time: 07/13/23  3:11 AM   Specimen: SPU  Result Value Ref Range Status   Specimen Description SPUTUM  Final   Special Requests NONE  Final   Sputum evaluation   Final    Sputum specimen not acceptable for testing.  Please recollect.   RESULT CALLED TO, READ BACK BY AND VERIFIED WITH: DAEJA,RN@[email or web address]  07/13/23 MK Performed at [facility], [address], [place], [place] [address]    Report Status 07/13/2023 FINAL  Final  Expectorated Sputum Assessment w Gram Stain, Rflx to Resp Cult     Status: None   Collection Time: 07/13/23  9:52 AM   Specimen: Sputum  Result Value Ref Range Status   Specimen Description SPUTUM  Final   Special Requests NONE  Final   Sputum evaluation   Final    THIS SPECIMEN IS ACCEPTABLE FOR SPUTUM CULTURE Performed at [facility], [address], [place], [place] [address]    Report Status 07/13/2023 FINAL  Final  Culture, Respiratory w Gram Stain     Status: None (Preliminary result)   Collection Time: 07/13/23  9:52 AM   Specimen: SPU  Result Value Ref Range Status   Specimen  Description SPUTUM  Final   Special Requests NONE Reflexed from [id number]  Final   Gram Stain   Final    FEW SQUAMOUS EPITHELIAL CELLS PRESENT FEW WBC PRESENT,BOTH PMN AND MONONUCLEAR MODERATE GRAM VARIABLE ROD FEW GRAM POSITIVE COCCI IN CLUSTERS Performed at [facility], [address] [place], [place] [address]    Culture PENDING  Incomplete   Report Status PENDING  Incomplete     Radiology Studies: CT CHEST W CONTRAST Result Date: 07/12/2023 CLINICAL DATA:  Evaluate for pneumonia. History of small cell lung cancer. * Tracking Code: BO * EXAM: CT CHEST WITH CONTRAST TECHNIQUE: Multidetector CT imaging of the chest was performed during intravenous contrast administration. RADIATION DOSE REDUCTION: This exam was performed according to the departmental dose-optimization program which includes automated exposure control, adjustment of the mA and/or  kV according to patient size and/or use of iterative reconstruction technique. CONTRAST:  75mL OMNIPAQUE  IOHEXOL  350 MG/ML SOLN COMPARISON:  12/24/2022 FINDINGS: Cardiovascular: Normal heart size. No pericardial effusion. Aortic atherosclerosis. Coronary artery calcifications. Mediastinum/Nodes: Thyroid  gland, trachea, and esophagus appear normal. No enlarged supraclavicular, axillary, mediastinal, or hilar lymph nodes. Lungs/Pleura: Small bilateral pleural effusions. Severe changes of emphysema with diffuse bronchial wall thickening. Scarring and architectural distortion with volume loss is noted within the right upper lobe extending into the apex. This is stable in appearance when compared with the previous exam. Within the right midlung there is perihilar fibrosis and architectural distortion which is unchanged from the previous exam and is compatible with post treatment changes. A small fiducial marker is identified within this area, image 64/5. Chronic scarring within the right lung base is also unchanged in the interval, image 99/5. No superimposed acute airspace consolidation to suggest pneumonia. Nodule within the posterior right apex is stable measuring 6 mm, image 20/5. Upper Abdomen: No acute abnormality. 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 No follow-up imaging recommended. Cystic lesion arising off the tail of the pancreas is again noted. This measures 2.4 x 1.9 cm, image 168/3. Previously this measured the same. Musculoskeletal: The bones are diffusely osteopenic. Extensive multifocal compression fractures are identified within the thoracic spine. Compression fractures involving the T6, T7 and T8 vertebra are stable compared with 12/24/2022. There are new fracture deformities involving the T11 and T12 vertebra with loss of approximately 30% of the vertebral body heights at these levels. Compression fracture involving the L2 vertebra is unchanged from 12/24/2022.  IMPRESSION: 1. No acute cardiopulmonary abnormalities. No signs of pneumonia. 2. Small bilateral pleural effusions. New from previous CT from 12/24/2022. 3. Stable appearance of post treatment changes within the right lung. 4. Stable appearance of 6 mm nodule within the posterior right apex. 5. Stable appearance of cystic lesion arising off the tail of the pancreas. See report from 07/27/2022 for follow-up recommendations. 6. Extensive multifocal compression fractures are identified within the thoracic spine. Compression fractures involving the T6, T7 and T8 vertebra are stable compared with 12/24/2022. There are new fracture deformities involving the T11 and T12 vertebra with loss of approximately 30% of the vertebral body heights at these levels. Compression fracture involving the L2 vertebra is unchanged from 12/24/2022. 7. Coronary artery calcifications. 8. Aortic Atherosclerosis (ICD10-I70.0) and Emphysema (ICD10-J43.9). Electronically Signed   By: Waddell Calk M.D.   On: 07/12/2023 17:12    Scheduled Meds:  sodium chloride    Intravenous Once   apixaban   2.5 mg Oral BID   azithromycin   250 mg Oral Q M,W,F   Chlorhexidine   Gluconate Cloth  6 each Topical Q0600   ciprofloxacin   750 mg Oral BID   ipratropium-albuterol   3 mL Nebulization TID   mupirocin  ointment  1 Application Nasal BID   pantoprazole   20 mg Oral Daily   polyvinyl alcohol   1 drop Both Eyes QODAY   predniSONE   40 mg Oral Q breakfast   roflumilast   500 mcg Oral Daily   rosuvastatin   20 mg Oral QPM   Continuous Infusions:   LOS: 3 days   Time spent: 40 minutes  Camellia Door, DO  [facility]  07/14/2023, 10:20 AM

## 2023-07-14 NOTE — TOC Transition Note (Addendum)
 Transition of Care (TOC) - Discharge Note Rayfield Gobble RN,BSN Transitions of Care Unit 4NP (Non Trauma)- RN Case Manager See Treatment Team for direct Phone #   Patient Details  Name: Francisco [name] MRN: [id number] Date of Birth: 1946/01/27  Transition of Care [facility]) CM/SW Contact:  Gobble Rayfield Hurst, RN Phone Number: 07/14/2023, 2:39 PM   Clinical Narrative:    CM has confirmed with [employer] liaison- Mitch that NIV/BIPAP ready for home delivery- Per Mitch [employer] can deliver NIV today to the home or hospital.  MD has cleared pt for discharge. Pt stable to transition home.   CM to room to speak w/ pt, daughter also present at bedside. Confirmed with pt and daughter that daughter will transport home, daughter has transport 02 for ride home.  Discussed approval of NIV/BIPAP for home- confirmed with pt and family plan to have DME delivered to the home- [employer] liaison updated and will follow up for home delivery and education.  Noted order also placed for home oral suctioning- [employer] liaison updated and suctioning to be delivered to home as well.   No further TOC needs noted. MD to provide GOLD DNR for home.    Final next level of care: Home/Self Care Barriers to Discharge: Barriers Resolved   Patient Goals and CMS Choice   CMS Medicare.gov Compare Post Acute Care list provided to:: Patient Choice offered to / list presented to : Patient [place] ownership interest in [facility].provided to:: Spouse    Discharge Placement               Home        Discharge Plan and Services Additional resources added to the After Visit Summary for     Discharge Planning Services: CM Consult Post Acute Care Choice: Durable Medical Equipment          DME Arranged: NIV DME Agency: [employer] Date DME Agency Contacted: 07/11/23 Time DME Agency Contacted: 1521 Representative spoke with at DME Agency: [facility] Arranged:  [place] [facility])           Social Drivers of Health (SDOH) Interventions SDOH Screenings   Food Insecurity: No Food Insecurity (07/12/2023)  Housing: Unknown (07/12/2023)  Transportation Needs: No Transportation Needs (07/12/2023)  Utilities: Not At Risk (07/12/2023)  Alcohol  Screen: Low Risk  (02/03/2023)  Depression (PHQ2-9): Low Risk  (03/03/2023)  Financial Resource Strain: Low Risk  (02/03/2023)  Physical Activity: Unknown (02/03/2023)  Social Connections: Moderately Isolated (07/12/2023)  Stress: No Stress Concern Present (02/03/2023)  Tobacco Use: Medium Risk (05/26/2023)     Readmission Risk Interventions    07/14/2023    2:38 PM 08/13/2022    1:46 PM  Readmission Risk Prevention Plan  Transportation Screening Complete Complete  PCP or Specialist Appt within 5-7 Days  Complete  PCP or Specialist Appt within 3-5 Days Complete   Home Care Screening  Complete  Medication Review (RN CM)  Complete  HRI or Home Care Consult Complete   Social Work Consult for Recovery Care Planning/Counseling Complete   Palliative Care Screening Not Applicable   Medication Review Oceanographer) Complete

## 2023-07-14 NOTE — Plan of Care (Signed)
  Problem: Education: Goal: Knowledge of disease or condition will improve Outcome: Adequate for Discharge Goal: Knowledge of the prescribed therapeutic regimen will improve Outcome: Adequate for Discharge Goal: Individualized Educational Video(s) Outcome: Adequate for Discharge   Problem: Activity: Goal: Ability to tolerate increased activity will improve Outcome: Adequate for Discharge Goal: Will verbalize the importance of balancing activity with adequate rest periods Outcome: Adequate for Discharge   Problem: Respiratory: Goal: Ability to maintain a clear airway will improve Outcome: Adequate for Discharge Goal: Levels of oxygenation will improve Outcome: Adequate for Discharge Goal: Ability to maintain adequate ventilation will improve Outcome: Adequate for Discharge   Problem: Education: Goal: Knowledge of General Education information will improve Description: Including pain rating scale, medication(s)/side effects and non-pharmacologic comfort measures Outcome: Adequate for Discharge   Problem: Health Behavior/Discharge Planning: Goal: Ability to manage health-related needs will improve Outcome: Adequate for Discharge   Problem: Clinical Measurements: Goal: Ability to maintain clinical measurements within normal limits will improve Outcome: Adequate for Discharge Goal: Will remain free from infection Outcome: Adequate for Discharge Goal: Diagnostic test results will improve Outcome: Adequate for Discharge Goal: Respiratory complications will improve Outcome: Adequate for Discharge Goal: Cardiovascular complication will be avoided Outcome: Adequate for Discharge   Problem: Activity: Goal: Risk for activity intolerance will decrease Outcome: Adequate for Discharge   Problem: Nutrition: Goal: Adequate nutrition will be maintained Outcome: Adequate for Discharge   Problem: Coping: Goal: Level of anxiety will decrease Outcome: Adequate for Discharge    Problem: Elimination: Goal: Will not experience complications related to bowel motility Outcome: Adequate for Discharge Goal: Will not experience complications related to urinary retention Outcome: Adequate for Discharge   Problem: Pain Management: Goal: General experience of comfort will improve Outcome: Adequate for Discharge   Problem: Safety: Goal: Ability to remain free from injury will improve Outcome: Adequate for Discharge   Problem: Skin Integrity: Goal: Risk for impaired skin integrity will decrease Outcome: Adequate for Discharge   Pt D/C home with NIV, Pt A&OX4. Reviewed AVS with daughter bedside, included an extra copy. All belongings returned, IV taken out. BP 120/76   Pulse (!) 111   Temp 97.7 F (36.5 C) (Oral)   Resp (!) 22   SpO2 94%  All follow up questions answered, pt escorted off the floor by staff. Francisco [name] 07/14/23 1:53 PM

## 2023-07-15 ENCOUNTER — Ambulatory Visit (HOSPITAL_COMMUNITY): Payer: Medicare Other

## 2023-07-15 ENCOUNTER — Other Ambulatory Visit: Payer: Medicare Other

## 2023-07-15 ENCOUNTER — Telehealth: Payer: Self-pay

## 2023-07-15 ENCOUNTER — Other Ambulatory Visit: Payer: Self-pay

## 2023-07-15 DIAGNOSIS — Z5189 Encounter for other specified aftercare: Secondary | ICD-10-CM

## 2023-07-15 NOTE — Transitions of Care (Post Inpatient/ED Visit) (Signed)
 07/15/2023  Name: Francisco [name] MRN: [id number] DOB: 1946/03/12  Today's TOC FU Call Status: Today's TOC FU Call Status:: Successful TOC FU Call Completed TOC FU Call Complete Date: 07/15/23 Patient's Name and Date of Birth confirmed.  Transition Care Management Follow-up Telephone Call Date of Discharge: 07/14/23 Discharge Facility: [facility]) Type of Discharge: Inpatient Admission Primary Inpatient Discharge Diagnosis:: Acute on Chronic Respiratory Failure with Hypoxia and Hypercapnia How have you been since you were released from the hospital?: Better Any questions or concerns?: No  Items Reviewed: Did you receive and understand the discharge instructions provided?: Yes Medications obtained,verified, and reconciled?: Yes (Medications Reviewed) Any new allergies since your discharge?: No Dietary orders reviewed?: No Do you have support at home?: Yes People in Home: spouse Name of Support/Comfort Primary Source: [place]  Medications Reviewed Today: Medications Reviewed Today     Reviewed by Fabian Sober, RN (Case Manager) on 07/15/23 at 1257  Med List Status: <None>   Medication Order Taking? Sig Documenting Provider Last Dose Status Informant  acetaminophen  (TYLENOL ) 500 MG tablet [id number] Yes Take 500 mg by mouth every 4 (four) hours as needed for moderate pain, fever, headache or mild pain. [provider] Taking Active Spouse/Significant Other  albuterol  (VENTOLIN  HFA) 108 (90 Base) MCG/ACT inhaler [id number] Yes Inhale 2 puffs into the lungs every 6 (six) hours as needed for wheezing or shortness of breath. [provider] Taking Active Spouse/Significant Other  azithromycin  (ZITHROMAX ) 250 MG tablet [id number] Yes Take 1 tablet (250 mg total) by mouth every Monday, Wednesday, and Friday. hold if placed on other antibiotics Laurence Locus, DO Taking Active   Calcium  Carb-Cholecalciferol  (CALCIUM  + D3 PO) [id number] Yes Take 1 tablet by mouth daily with  breakfast. [provider] Taking Active Spouse/Significant Other  docusate sodium  (COLACE) 100 MG capsule [id number] Yes Take 2 capsules (200 mg total) by mouth 2 (two) times daily.  Patient taking differently: Take 200 mg by mouth 2 (two) times daily as needed (when taking pain meds).   Elgergawy, Brayton RAMAN, MD Taking Active Spouse/Significant Other  ELIQUIS  2.5 MG TABS tablet [id number] Yes TAKE 1 TABLET BY MOUTH TWICE  DAILY  Patient taking differently: Take 2.5 mg by mouth 2 (two) times daily.   [name], MD Taking Active Spouse/Significant Other  Ferrous Sulfate  (IRON ) 325 (65 Fe) MG TABS [id number] Yes Take 325 mg by mouth See admin instructions. Take 325 mg by mouth with food every other day [provider] Taking Active Spouse/Significant Other  fluticasone  (FLONASE ) 50 MCG/ACT nasal spray [id number] Yes USE 2 SPRAYS IN BOTH  NOSTRILS DAILY  Patient taking differently: Place 2 sprays into both nostrils daily.   [name], MD Taking Active Spouse/Significant Other  folic acid  (FOLVITE ) 1 MG tablet [id number] No Take 1 tablet (1 mg total) by mouth daily.  Patient not taking: Reported on 07/09/2023   Mikhail, Maryann, DO Not Taking Active Spouse/Significant Other  furosemide  (LASIX ) 20 MG tablet [id number] Yes Take 1 tablet (20 mg total) by mouth daily as needed for edema (for lower leg edema. do not take for more than 3 days in a row). Laurence Locus, DO Taking Active            Med Note HASSEL, [facility]   Tue Jul 15, 2023 12:56 PM) Patient is taking daily until he has MD follow up  ipratropium-albuterol  (DUONEB) 0.5-2.5 (3) MG/3ML SOLN [id number] Yes Take 3 mLs by nebulization 3 (three) times daily. Laurence Locus, DO  Taking Active   Multiple Vitamin (MULTIVITAMIN WITH MINERALS) TABS tablet [id number] Yes Take 1 tablet by mouth daily.  Patient taking differently: Take 1 tablet by mouth daily with breakfast.   Mikhail, Maryann, DO Taking Active Spouse/Significant Other   mupirocin  cream (BACTROBAN ) 2 % [id number] Yes Apply 1 Application topically 2 (two) times daily.  Patient taking differently: Apply 1 Application topically 2 (two) times daily as needed (as directed).   [name], MD Taking Active Spouse/Significant Other  OXYGEN [id number] Yes Inhale 4.5 L/min into the lungs continuous. [provider] Taking Active Spouse/Significant Other  pantoprazole  (PROTONIX ) 20 MG tablet [id number] Yes TAKE 1 TABLET BY MOUTH DAILY  Patient taking differently: Take 20 mg by mouth daily before breakfast.   [name], MD Taking Active Spouse/Significant Other  predniSONE  (DELTASONE ) 10 MG tablet [id number] Yes Take 4 tablets (40 mg total) by mouth daily with breakfast for 3 days, THEN 3 tablets (30 mg total) daily with breakfast for 3 days, THEN 2 tablets (20 mg total) daily with breakfast for 3 days, THEN 1 tablet (10 mg total) daily with breakfast for 3 days. Laurence Locus, DO Taking Active   roflumilast  (DALIRESP ) 500 MCG TABS tablet [id number] Yes TAKE 1 TABLET BY MOUTH DAILY  Patient taking differently: Take 500 mcg by mouth in the morning.   Geronimo Amel, MD Taking Active Spouse/Significant Other  rosuvastatin  (CRESTOR ) 20 MG tablet [id number] Yes Take 1 tablet (20 mg total) by mouth daily.  Patient taking differently: Take 20 mg by mouth every evening.   [name], MD Taking Active Spouse/Significant Other  SYMBICORT  160-4.5 MCG/ACT inhaler [id number] Yes USE 2 INHALATIONS BY MOUTH TWICE DAILY  Patient taking differently: Inhale 2 puffs into the lungs 2 (two) times daily.   [name], MD Taking Active Spouse/Significant Other  SYSTANE PRESERVATIVE FREE 0.4-0.3 % SOLN [id number] Yes Place 1 drop into both eyes 2 (two) times daily. [provider] Taking Active Spouse/Significant Other  Tiotropium Bromide  Monohydrate (SPIRIVA  RESPIMAT) 2.5 MCG/ACT AERS [id number] Yes USE 2 INHALATIONS BY MOUTH ONCE  DAILY Geronimo Amel, MD  Taking Active Spouse/Significant Other  traMADol  (ULTRAM ) 50 MG tablet [id number] Yes Take 50 mg by mouth every 12 (twelve) hours as needed for severe pain. [provider] Taking Active Spouse/Significant Other            Home Care and Equipment/Supplies: Were Home Health Services Ordered?: No Any new equipment or medical supplies ordered?: Yes Name of Medical supply agency?: [employer]/B[facility] Were you able to get the equipment/medical supplies?: Yes Do you have any questions related to the use of the equipment/supplies?: No  Functional Questionnaire: Do you need assistance with bathing/showering or dressing?: Yes Do you need assistance with meal preparation?: Yes Do you need assistance with eating?: No Do you have difficulty maintaining continence: No Do you need assistance with getting out of bed/getting out of a chair/moving?: No Do you have difficulty managing or taking your medications?: No  Follow up appointments reviewed: PCP Follow-up appointment confirmed?: Yes Date of PCP follow-up appointment?: 07/21/23 Follow-up Provider: Dr. Katrinka Specialist [facility] Follow-up appointment confirmed?: Yes Date of Specialist follow-up appointment?: 08/14/23 Follow-Up Specialty Provider:: Almarie Ferrari NP Do you need transportation to your follow-up appointment?: No Do you understand care options if your condition(s) worsen?: Yes-patient verbalized understanding  SDOH Interventions Today    Flowsheet Row Most Recent Value  SDOH Interventions   Food Insecurity Interventions Intervention Not Indicated  Housing Interventions Intervention Not Indicated  Transportation  Interventions Intervention Not Indicated  Utilities Interventions Intervention Not Indicated      Barnie Gowda RN, BSN, CCM RN Care Manager  Transitions of Care  [facility] - Population Health  [phone]

## 2023-07-16 LAB — CULTURE, RESPIRATORY W GRAM STAIN

## 2023-07-21 ENCOUNTER — Ambulatory Visit: Payer: Medicare Other | Admitting: Family Medicine

## 2023-07-24 ENCOUNTER — Inpatient Hospital Stay: Payer: Medicare Other | Attending: Internal Medicine | Admitting: Internal Medicine

## 2023-07-24 DIAGNOSIS — Z9981 Dependence on supplemental oxygen: Secondary | ICD-10-CM | POA: Insufficient documentation

## 2023-07-24 DIAGNOSIS — C3490 Malignant neoplasm of unspecified part of unspecified bronchus or lung: Secondary | ICD-10-CM | POA: Diagnosis not present

## 2023-07-24 DIAGNOSIS — Z85118 Personal history of other malignant neoplasm of bronchus and lung: Secondary | ICD-10-CM | POA: Insufficient documentation

## 2023-07-24 NOTE — Progress Notes (Signed)
[facility] [phone]   Fax:([phone]  PROGRESS NOTE FOR TELEMEDICINE VISITS  Shelva Majestic, MD [address] [place] [address]  I connected withNAME@ on 07/24/23 at  9:00 AM EST by video enabled telemedicine visit and verified that I am speaking with the correct person using two identifiers.   I discussed the limitations, risks, security and privacy concerns of performing an evaluation and management service by telemedicine and the availability of in-person appointments. I also discussed with the patient that there may be a patient responsible charge related to this service. The patient expressed understanding and agreed to proceed.  Other persons participating in the visit and their role in the encounter:  Wife  Patient's location:  Home Provider's location: [facility]  DIAGNOSIS:  limited stage (T1a, N0, M0) small cell lung cancer presented with 0.9 cm superior segment right lower lobe pulmonary nodule diagnosed in February 2023.   PRIOR THERAPY: SBRT to the right lower lobe lung nodule under the care of Dr. Mitzi Hansen   CURRENT THERAPY: Observation  INTERVAL HISTORY: Francisco [name] 78 y.o. 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 Discussed the use of AI scribe software for clinical note transcription with the patient, who gave verbal consent to proceed.  History of Present Illness   The patient, a 78 year old with a history of limited stage small cell lung cancer diagnosed in February 2023, underwent stereotactic body radiation therapy (SBRT) without chemotherapy. The patient was admitted to the hospital a week prior to the consultation due to respiratory distress. The cause of the distress was identified as carbon dioxide retention, and the patient was started on CPAP therapy. The patient's oxygen saturation was notably low upon hospital admission, in the  70s.  During the hospital stay, a repeat scan of the chest was performed, which showed stable disease with no new cancer or spread. The patient also has a history of osteoporosis, which has led to compression fractures at T6, T7, and T8.  The patient denies any new chest pain or hemoptysis. He is currently on home oxygen at 4 liters and reports an oxygen saturation of 95% at rest, which drops with activity. The patient reports that the CPAP therapy seems to be helping.  The patient's last brain MRI was in July 2024, and the next one is scheduled for February 2025. The patient has not reported any new symptoms or concerns since the last consultation.       MEDICAL HISTORY: Past Medical History:  Diagnosis Date   Acute GI bleeding 02/18/2020   Melena with hemoglobin down to 10 February 2020.  EGD with intervention-follow-up with Dr. Meridee Score     Chronic back pain    "mid and lower" (04/07/2018)   Chronic rhinitis    -Sinus Ct 08/01/2009 >> Bilateral maxillary sinusitis with some mucosal thickeningin the sphenoid and frontal sinuses as well with air fluid levels present -chronic rhinitis flyer Aug 04, 2009   Compressed spine fracture [facility])    COPD (chronic obstructive pulmonary disease) (HCC)    PFT's rec Jul 17, 2009   Dyspnea    Emphysema lung (HCC)    Hyperglycemia 06/12/2016   Iron deficiency anemia due to chronic blood loss 08/23/2020   Left ankle swelling 07/22/2014   HTN- controlled when fluid status contolled. on triamterene-hctz 37.5-25mg  sparingly  Unclear etiology.   LLE U/S 2016:     Leukocytosis 12/19/2016  Melena 08/11/2022   On home oxygen therapy    "3L; 24/7" (04/07/2018)   Onychomycosis    Dr. Melvyn Novas   Orthostatic hypotension    "since 10/2017" (04/07/2018)   PAD (peripheral artery disease) (HCC)    Pneumonia    "twice in 1 year" (04/07/2018)   Pulmonary embolism (HCC) 04/07/2018   Skin cancer    "lips, face, ears, arms" (04/07/2018)   Small cell lung cancer, right  (HCC) 2023   Dr. Mitzi Hansen.  rad   Vertigo    "since ~ 02/2018" (04/07/2018)    ALLERGIES:  is allergic to tape, augmentin [amoxicillin-pot clavulanate], gadavist [gadobutrol], gadolinium, levaquin [levofloxacin], and lipitor [atorvastatin].  MEDICATIONS:  Current Outpatient Medications  Medication Sig Dispense Refill   acetaminophen (TYLENOL) 500 MG tablet Take 500 mg by mouth every 4 (four) hours as needed for moderate pain, fever, headache or mild pain.     albuterol (VENTOLIN HFA) 108 (90 Base) MCG/ACT inhaler Inhale 2 puffs into the lungs every 6 (six) hours as needed for wheezing or shortness of breath.     azithromycin (ZITHROMAX) 250 MG tablet Take 1 tablet (250 mg total) by mouth every Monday, Wednesday, and Friday. hold if placed on other antibiotics 40 each 0   Calcium Carb-Cholecalciferol (CALCIUM + D3 PO) Take 1 tablet by mouth daily with breakfast.     docusate sodium (COLACE) 100 MG capsule Take 2 capsules (200 mg total) by mouth 2 (two) times daily. (Patient taking differently: Take 200 mg by mouth 2 (two) times daily as needed (when taking pain meds).) 10 capsule 0   ELIQUIS 2.5 MG TABS tablet TAKE 1 TABLET BY MOUTH TWICE  DAILY (Patient taking differently: Take 2.5 mg by mouth 2 (two) times daily.) 180 tablet 3   Ferrous Sulfate (IRON) 325 (65 Fe) MG TABS Take 325 mg by mouth See admin instructions. Take 325 mg by mouth with food every other day     fluticasone (FLONASE) 50 MCG/ACT nasal spray USE 2 SPRAYS IN BOTH  NOSTRILS DAILY (Patient taking differently: Place 2 sprays into both nostrils daily.) 48 g 3   folic acid (FOLVITE) 1 MG tablet Take 1 tablet (1 mg total) by mouth daily. (Patient not taking: Reported on 07/09/2023)     furosemide (LASIX) 20 MG tablet Take 1 tablet (20 mg total) by mouth daily as needed for edema (for lower leg edema. do not take for more than 3 days in a row). 90 tablet 0   ipratropium-albuterol (DUONEB) 0.5-2.5 (3) MG/3ML SOLN Take 3 mLs by nebulization 3  (three) times daily. 810 mL 0   Multiple Vitamin (MULTIVITAMIN WITH MINERALS) TABS tablet Take 1 tablet by mouth daily. (Patient taking differently: Take 1 tablet by mouth daily with breakfast.)     mupirocin cream (BACTROBAN) 2 % Apply 1 Application topically 2 (two) times daily. (Patient taking differently: Apply 1 Application topically 2 (two) times daily as needed (as directed).) 30 g 0   OXYGEN Inhale 4.5 L/min into the lungs continuous.     pantoprazole (PROTONIX) 20 MG tablet TAKE 1 TABLET BY MOUTH DAILY (Patient taking differently: Take 20 mg by mouth daily before breakfast.) 90 tablet 3   predniSONE (DELTASONE) 10 MG tablet Take 4 tablets (40 mg total) by mouth daily with breakfast for 3 days, THEN 3 tablets (30 mg total) daily with breakfast for 3 days, THEN 2 tablets (20 mg total) daily with breakfast for 3 days, THEN 1 tablet (10 mg total) daily with breakfast for  3 days. 30 tablet 0   roflumilast (DALIRESP) 500 MCG TABS tablet TAKE 1 TABLET BY MOUTH DAILY (Patient taking differently: Take 500 mcg by mouth in the morning.) 90 tablet 3   rosuvastatin (CRESTOR) 20 MG tablet Take 1 tablet (20 mg total) by mouth daily. (Patient taking differently: Take 20 mg by mouth every evening.) 90 tablet 3   SYMBICORT 160-4.5 MCG/ACT inhaler USE 2 INHALATIONS BY MOUTH TWICE DAILY (Patient taking differently: Inhale 2 puffs into the lungs 2 (two) times daily.) 30.6 g 3   SYSTANE PRESERVATIVE FREE 0.4-0.3 % SOLN Place 1 drop into both eyes 2 (two) times daily.     Tiotropium Bromide Monohydrate (SPIRIVA RESPIMAT) 2.5 MCG/ACT AERS USE 2 INHALATIONS BY MOUTH ONCE  DAILY 12 g 3   traMADol (ULTRAM) 50 MG tablet Take 50 mg by mouth every 12 (twelve) hours as needed for severe pain.     No current facility-administered medications for this visit.    SURGICAL HISTORY:  Past Surgical History:  Procedure Laterality Date   ABDOMINAL AORTOGRAM W/LOWER EXTREMITY N/A 01/21/2020   Procedure: ABDOMINAL AORTOGRAM  W/LOWER EXTREMITY;  Surgeon: Sherren Kerns, MD;  Location: [facility];  Service: Cardiovascular;  Laterality: N/A;   APPENDECTOMY     BIOPSY  02/20/2020   Procedure: BIOPSY;  Surgeon: Meridee Score Netty Starring., MD;  Location: [facility] ENDOSCOPY;  Service: Gastroenterology;;   BIOPSY  08/12/2022   Procedure: BIOPSY;  Surgeon: Shellia Cleverly, DO;  Location: [facility] ENDOSCOPY;  Service: Gastroenterology;;   BRONCHIAL BIOPSY  09/04/2021   Procedure: BRONCHIAL BIOPSIES;  Surgeon: Josephine Igo, DO;  Location: [facility] ENDOSCOPY;  Service: Pulmonary;;   BRONCHIAL BRUSHINGS  09/04/2021   Procedure: BRONCHIAL BRUSHINGS;  Surgeon: Josephine Igo, DO;  Location: [facility] ENDOSCOPY;  Service: Pulmonary;;   BRONCHIAL NEEDLE ASPIRATION BIOPSY  09/04/2021   Procedure: BRONCHIAL NEEDLE ASPIRATION BIOPSIES;  Surgeon: Josephine Igo, DO;  Location: [facility] ENDOSCOPY;  Service: Pulmonary;;   CATARACT EXTRACTION, BILATERAL     ENDARTERECTOMY FEMORAL Right 04/19/2019   Procedure: ENDARTERECTOMY RIGHT COMMON FEMORAL;  Surgeon: Sherren Kerns, MD;  Location: [facility] OR;  Service: Vascular;  Laterality: Right;   ENDARTERECTOMY FEMORAL Left 01/24/2020   Procedure: LEFT FEMORAL ENDARTERECTOMY WITH DACRON PATCH ANGIOPLASTY;  Surgeon: Sherren Kerns, MD;  Location: [facility] OR;  Service: Vascular;  Laterality: Left;   ESOPHAGOGASTRODUODENOSCOPY (EGD) WITH PROPOFOL N/A 02/20/2020   Procedure: ESOPHAGOGASTRODUODENOSCOPY (EGD) WITH PROPOFOL;  Surgeon: Lemar Lofty., MD;  Location[facility] ENDOSCOPY;  Service: Gastroenterology;  Laterality: N/A;   ESOPHAGOGASTRODUODENOSCOPY (EGD) WITH PROPOFOL N/A 08/12/2022   Procedure: ESOPHAGOGASTRODUODENOSCOPY (EGD) WITH PROPOFOL;  Surgeon: Shellia Cleverly, DO;  Location: [facility] ENDOSCOPY;  Service: Gastroenterology;  Laterality: N/A;   FEMORAL ENDARTERECTOMY Left 01/24/2020   FIDUCIAL MARKER PLACEMENT  09/04/2021   Procedure: FIDUCIAL MARKER PLACEMENT;  Surgeon: Josephine Igo, DO;  Location: [facility]  ENDOSCOPY;  Service: Pulmonary;;   HEMOSTASIS CLIP PLACEMENT  02/20/2020   Procedure: HEMOSTASIS CLIP PLACEMENT;  Surgeon: Lemar Lofty., MD;  Location: [facility] ENDOSCOPY;  Service: Gastroenterology;;   HEMOSTASIS CLIP PLACEMENT  08/12/2022   Procedure: HEMOSTASIS CLIP PLACEMENT;  Surgeon: Shellia Cleverly, DO;  Location: [facility] ENDOSCOPY;  Service: Gastroenterology;;   HOT HEMOSTASIS N/A 02/20/2020   Procedure: HOT HEMOSTASIS (ARGON PLASMA COAGULATION/BICAP);  Surgeon: Lemar Lofty., MD;  Location: [facility] ENDOSCOPY;  Service: Gastroenterology;  Laterality: N/A;   HOT HEMOSTASIS N/A 08/12/2022   Procedure: HOT HEMOSTASIS (ARGON PLASMA COAGULATION/BICAP);  Surgeon: Shellia Cleverly,  DO;  Location: [facility] ENDOSCOPY;  Service: Gastroenterology;  Laterality: N/A;   INSERTION OF ILIAC STENT Left 01/24/2020   Procedure: INSERTION OF VBX STENT 8X59 AND 8X39 LEFT COMMON ILIAC ARTERY. INSERTION OF INNOVA 7 X 60 INNOVA STENT LEFT EXTERNAL ILIAC ARTERY. ;  Surgeon: Sherren Kerns, MD;  Location: [facility] OR;  Service: Vascular;  Laterality: Left;   LOWER EXTREMITY ANGIOGRAPHY  12/11/2018   LOWER EXTREMITY ANGIOGRAPHY N/A 12/11/2018   Procedure: LOWER EXTREMITY ANGIOGRAPHY;  Surgeon: Sherren Kerns, MD;  [facility];  Service: Cardiovascular;  Laterality: N/A;   PATCH ANGIOPLASTY Right 04/19/2019   Procedure: Patch Angioplasty;  Surgeon: Sherren Kerns, MD;  Location: [facility] OR;  Service: Vascular;  Laterality: Right;   PERIPHERAL VASCULAR INTERVENTION Right 12/11/2018   Procedure: PERIPHERAL VASCULAR INTERVENTION;  Surgeon: Sherren Kerns, MD;  [facility]: [facility];  Service: Cardiovascular;  Laterality: Right;  Common Iliac    SKIN CANCER EXCISION     "lips, face, ears, arms" (04/07/2018)   SUBMUCOSAL INJECTION  08/12/2022   Procedure: SUBMUCOSAL INJECTION;  Surgeon: Shellia Cleverly, DO;  Location: [facility] ENDOSCOPY;  Service: Gastroenterology;;   TONSILLECTOMY     ULTRASOUND GUIDANCE  FOR VASCULAR ACCESS Right 01/24/2020   Procedure: ULTRASOUND GUIDANCE FOR VASCULAR ACCESS;  Surgeon: Sherren Kerns, MD;  Location: [facility] OR;  Service: Vascular;  Laterality: Right;   VIDEO BRONCHOSCOPY WITH RADIAL ENDOBRONCHIAL ULTRASOUND  09/04/2021   Procedure: VIDEO BRONCHOSCOPY WITH RADIAL ENDOBRONCHIAL ULTRASOUND;  Surgeon: Josephine Igo, DO;  Location: [facility] ENDOSCOPY;  Service: Pulmonary;;    REVIEW OF SYSTEMS:  A comprehensive review of systems was negative except for: Constitutional: positive for fatigue Respiratory: positive for cough and dyspnea on exertion   LABORATORY DATA: Lab Results  Component Value Date   WBC 6.6 07/14/2023   HGB 10.3 (L) 07/14/2023   HCT 32.5 (L) 07/14/2023   MCV 92.1 07/14/2023   PLT 100 (L) 07/14/2023      Chemistry      Component Value Date/Time   NA 140 07/14/2023 0440   NA 142 09/03/2022 1352   K 4.0 07/14/2023 0440   CL 91 (L) 07/14/2023 0440   CO2 40 (H) 07/14/2023 0440   BUN 19 07/14/2023 0440   BUN 13 09/03/2022 1352   CREATININE 1.01 07/14/2023 0440   CREATININE 0.94 12/24/2022 1006   CREATININE 1.14 07/06/2020 0941   GLU 90 03/02/2020 0000      Component Value Date/Time   CALCIUM 7.8 (L) 07/14/2023 0440   ALKPHOS 41 07/14/2023 0440   AST 29 07/14/2023 0440   AST 23 12/24/2022 1006   ALT 23 07/14/2023 0440   ALT 15 12/24/2022 1006   BILITOT 0.5 07/14/2023 0440   BILITOT 0.5 12/24/2022 1006       RADIOGRAPHIC STUDIES: CT CHEST W CONTRAST Result Date: 07/12/2023 CLINICAL DATA:  Evaluate for pneumonia. History of small cell lung cancer. * Tracking Code: BO * EXAM: CT CHEST WITH CONTRAST TECHNIQUE: Multidetector CT imaging of the chest was performed during intravenous contrast administration. RADIATION DOSE REDUCTION: This exam was performed according to the departmental dose-optimization program which includes automated exposure control, adjustment of the mA and/or kV according to patient size and/or use of iterative  reconstruction technique. CONTRAST:  75mL OMNIPAQUE IOHEXOL 350 MG/ML SOLN COMPARISON:  12/24/2022 FINDINGS: Cardiovascular: Normal heart size. No pericardial effusion. Aortic atherosclerosis. Coronary artery calcifications. Mediastinum/Nodes: Thyroid gland, trachea, and esophagus appear normal. No enlarged supraclavicular, axillary, mediastinal, or hilar  lymph nodes. Lungs/Pleura: Small bilateral pleural effusions. Severe changes of emphysema with diffuse bronchial wall thickening. Scarring and architectural distortion with volume loss is noted within the right upper lobe extending into the apex. This is stable in appearance when compared with the previous exam. Within the right midlung there is perihilar fibrosis and architectural distortion which is unchanged from the previous exam and is compatible with post treatment changes. A small fiducial marker is identified within this area, image 64/5. Chronic scarring within the right lung base is also unchanged in the interval, image 99/5. No superimposed acute airspace consolidation to suggest pneumonia. Nodule within the posterior right apex is stable measuring 6 mm, image 20/5. Upper Abdomen: No acute abnormality. 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 No follow-up imaging recommended. Cystic lesion arising off the tail of the pancreas is again noted. This measures 2.4 x 1.9 cm, image 168/3. Previously this measured the same. Musculoskeletal: The bones are diffusely osteopenic. Extensive multifocal compression fractures are identified within the thoracic spine. Compression fractures involving the T6, T7 and T8 vertebra are stable compared with 12/24/2022. There are new fracture deformities involving the T11 and T12 vertebra with loss of approximately 30% of the vertebral body heights at these levels. Compression fracture involving the L2 vertebra is unchanged from 12/24/2022. IMPRESSION: 1. No acute cardiopulmonary abnormalities. No  signs of pneumonia. 2. Small bilateral pleural effusions. New from previous CT from 12/24/2022. 3. Stable appearance of post treatment changes within the right lung. 4. Stable appearance of 6 mm nodule within the posterior right apex. 5. Stable appearance of cystic lesion arising off the tail of the pancreas. See report from 07/27/2022 for follow-up recommendations. 6. Extensive multifocal compression fractures are identified within the thoracic spine. Compression fractures involving the T6, T7 and T8 vertebra are stable compared with 12/24/2022. There are new fracture deformities involving the T11 and T12 vertebra with loss of approximately 30% of the vertebral body heights at these levels. Compression fracture involving the L2 vertebra is unchanged from 12/24/2022. 7. Coronary artery calcifications. 8. Aortic Atherosclerosis (ICD10-I70.0) and Emphysema (ICD10-J43.9). Electronically Signed   By: Signa Kell M.D.   On: 07/12/2023 17:12   DG Chest Portable 1 View Result Date: 07/09/2023 CLINICAL DATA:  COPD, shortness of breath, cough EXAM: PORTABLE CHEST - 1 VIEW COMPARISON:  02/20/2023 FINDINGS: Chronic biapical pleuroparenchymal scarring. Somewhat coarse pulmonary interstitial markings as before. Fiducial marker projects over the right hilum as before. No new infiltrate or overt edema. Heart size and mediastinal contours are within normal limits. Aortic Atherosclerosis (ICD10-170.0). No effusion. Visualized bones unremarkable. IMPRESSION: Chronic changes without acute findings. Electronically Signed   By: Corlis Leak M.D.   On: 07/09/2023 10:11    ASSESSMENT AND PLAN: This is a very pleasant 78 years old white male diagnosed with limited stage (T1 a, N0, M0) small cell lung cancer presented with 0.9 cm superior segment right lower lobe pulmonary nodule diagnosed in February 2023 status post SBRT to the right lower lobe lung nodule  The patient has been on observation and he is feeling fine with no  concerning complaints except for the baseline shortness of breath secondary to COPD and he is currently on home oxygen. The patient had CT scan of the chest performed on July 12, 2023 and that showed no concerning findings for disease progression    Limited Stage Small Cell Lung Cancer Diagnosed February 2023. Underwent SPRT. No chemotherapy. Last scan six months ago showed no progression. Recent scan  on July 12, 2023, confirmed stability. Plan for another scan in six months. Patient advised to report new symptoms. - Arrange follow-up scan in six months - Advise to report new symptoms  Respiratory Distress with Hypercapnia Hospitalized last week for respiratory distress and hypercapnia. Oxygen saturation was in the seventies. Currently on CPAP and home oxygen (4L). Home oxygen saturation around 95%, drops with activity. CPAP therapy beneficial. - Continue CPAP therapy - Continue home oxygen therapy - Monitor oxygen saturation regularly  Osteoporosis with Thoracic Spine Compression Fractures Compression fractures at T6, T7, T8 likely due to osteoporosis. - Monitor for new symptoms or complications related to fractures  General Health Maintenance Brain MRI follow-up scheduled for August 12, 2023, with Dr. Mitzi Hansen. - Ensure follow-up with Dr. Mitzi Hansen for brain MRI on August 12, 2023.   The patient was advised to call immediately if he has any concerning symptoms in the interval. I discussed the assessment and treatment plan with the patient. The patient was provided an opportunity to ask questions and all were answered. The patient agreed with the plan and demonstrated an understanding of the instructions.   The patient was advised to call back or seek an in-person evaluation if the symptoms worsen or if the condition fails to improve as anticipated.  I provided 20 minutes of face-to-face video visit time during this encounter, and > 50% was spent counseling as documented under my assessment  & plan.  Lajuana Matte, MD 07/24/2023 9:21 AM  Disclaimer: This note was dictated with voice recognition software. Similar sounding words can inadvertently be transcribed and may not be corrected upon review.

## 2023-08-07 ENCOUNTER — Telehealth: Payer: Self-pay | Admitting: Internal Medicine

## 2023-08-07 ENCOUNTER — Encounter: Payer: Self-pay | Admitting: Internal Medicine

## 2023-08-07 NOTE — Telephone Encounter (Signed)
PT wrote a MYCHART msg. As follows. Please call to advise.:   Have appointment on 08/14/23 for a discharge from hospital with Dr. Barnett Abu PA.  Seeing how she does not know what went on in the hospital these are some of the questions we have that she might need to get answer from the doctor before our visit. 1. In hospital Dr. Imogene Burn had him on and sent him home with Ipratropium Bromide and Albuterol Sulfate Inhalation Solution 0.5 mg/3 mg. 3 x a day.  Dr. Ardyth Man had me on Ohtuvayre 3mg /2.59ml 2 times daily.  WHICH ONE SHOULD I BE USING??? 2. Told to take Furosemide 20mg  not to exceed 3 days in Row And I was taking daily (which I have continued since being home.  SHOULD I TAKE DAILY? 3. Did not say to continue Prednisone 10mg  daily which I am doing?  Been on prednisone for years.  AM I CORRECT TO CONTINUE PREDNISONE DAILY? 4. Dr. Imogene Burn and I think Dr. Ardyth Man thought I was coming home with C-Pack/vent but insurance so far as only approved C-Pack.  According to [employer] on this upcoming visit the doctor needs to address C-Pack during visit of summary for Medicare purposes.  Also told by [employer] reason on machine was because I have sleep apnea.  NEVER BEEN TOLD THAT BY DR. RAM OR DR. Imogene Burn?  Do I truly have this?    PLEASE GET CLARIFICATION FROM DR. RAMASWANEY!  His # is [phone]       Above are a few question.

## 2023-08-08 NOTE — Telephone Encounter (Signed)
PT calling again. Needs answers to his questions before weekend. Can DOD be asked to answer these for him @ [phone]

## 2023-08-08 NOTE — Telephone Encounter (Signed)
     PT wrote a MYCHART msg. As follows. Please call to advise.:    Have appointment on 08/14/23 for a discharge from hospital with Dr. Barnett Abu PA.  Seeing how she does not know what went on in the hospital these are some of the questions we have that she might need to get answer from the doctor before our visit. 1. In hospital Dr. Imogene Burn had him on and sent him home with Ipratropium Bromide and Albuterol Sulfate Inhalation Solution 0.5 mg/3 mg. 3 x a day.  Dr. Ardyth Man had me on Ohtuvayre 3mg /2.66ml 2 times daily.  WHICH ONE SHOULD I BE USING??? 2. Told to take Furosemide 20mg  not to exceed 3 days in Row And I was taking daily (which I have continued since being home.  SHOULD I TAKE DAILY? 3. Did not say to continue Prednisone 10mg  daily which I am doing?  Been on prednisone for years.  AM I CORRECT TO CONTINUE PREDNISONE DAILY? 4. Dr. Imogene Burn and I think Dr. Ardyth Man thought I was coming home with C-Pack/vent but insurance so far as only approved C-Pack.  According to [employer] on this upcoming visit the doctor needs to address C-Pack during visit of summary for Medicare purposes.  Also told by [employer] reason on machine was because I have sleep apnea.  NEVER BEEN TOLD THAT BY DR. RAM OR DR. Imogene Burn?  Do I truly have this?    PLEASE GET CLARIFICATION FROM DR. RAMASWANEY!   His # is [phone]

## 2023-08-08 NOTE — Telephone Encounter (Signed)
Telephone 08/07/2023 [facility], Carmin Muskrat, MD Pulmonary Disease Unclear on Hospitral release inst on meds. Reason for conversation   All Conversations: Unclear on Hospitral release inst on meds. [facility] Message First)August 08, 2023 Kalman Shan, MD     08/08/23 11:44 AM Note         PT wrote a MYCHART msg. As follows. Please call to advise.:    Have appointment on 08/14/23 for a discharge from hospital with Dr. Barnett Abu PA.  Seeing how she does not know what went on in the hospital these are some of the questions we have that she might need to get answer from the doctor before our visit. 1. In hospital Dr. Imogene Burn had him on and sent him home with Ipratropium Bromide and Albuterol Sulfate Inhalation Solution 0.5 mg/3 mg. 3 x a day.  Dr. Ardyth Man had me on [facility] 3mg /2.68ml 2 times daily.  WHICH ONE SHOULD I BE USING??? 2. Told to take Furosemide 20mg  not to exceed 3 days in Row And I was taking daily (which I have continued since being home.  SHOULD I TAKE DAILY? 3. Did not say to continue Prednisone 10mg  daily which I am doing?  Been on prednisone for years.  AM I CORRECT TO CONTINUE PREDNISONE DAILY? 4. Dr. Imogene Burn and I think Dr. Ardyth Man thought I was coming home with C-Pack/vent but insurance so far as only approved C-Pack.  According to [employer] on this upcoming visit the doctor needs to address C-Pack during visit of summary for Medicare purposes.  Also told by [employer] reason on machine was because I have sleep apnea.  NEVER BEEN TOLD THAT BY DR. RAM OR DR. Imogene Burn?  Do I truly have this?    PLEASE GET CLARIFICATION FROM DR. RAMASWANEY!   His # is [phone]     Kalman Shan, MD to [facility]      08/08/23 11:44 AM . Please call to advise.:    Have appointment on 08/14/23 for a discharge from hospital with Dr. Barnett Abu PA.  Seeing how she does not know what went on in the hospital these are some of the questions we  have that she might need to get answer from the doctor before our visit. 1. In hospital Dr. Imogene Burn had him on and sent him home with Ipratropium Bromide and Albuterol Sulfate Inhalation Solution 0.5 mg/3 mg. 3 x a day.  Dr. Ardyth Man had me on Ohtuvayre 3mg /2.22ml 2 times daily.  WHICH ONE SHOULD I BE USING??? - BOTH. DIFFERENT MEDS. SO PLAN WAS DUONEB (alb and atrovent)        2. Told to take Furosemide 20mg  not to exceed 3 days in Row And I was taking daily (which I have continued since being home.  SHOULD I TAKE DAILY? - CANNOT ANSWER TILL WE SEEYOU. IF is not bothering you continue till seen     3. Did not say to continue Prednisone 10mg  daily which I am doing?  Been on prednisone for years.  AM I CORRECT TO CONTINUE PREDNISONE DAILY? - you were prieviously on prednisone so continue at 10mg  per day       4. Dr. Imogene Burn and I think Dr. Ardyth Man thought I was coming home with C-Pack/vent but insurance so far as only approved C-Pack.  According to [employer] on this upcoming visit the doctor needs to address C-Pack during visit of summary for Medicare purposes.  Also told by [employer]  reason on machine was because I have sleep apnea.  NEVER BEEN TOLD THAT BY DR. RAM OR DR. Imogene Burn?  Do I truly have this?    PLEASE GET CLARIFICATION FROM DR. RAMASWANEY! - You do not have sleep apnea. The reasaon for the night ventilation ws high co2 from COPD. CPAP v BiPAP - as long as you are on something is fine but we can address this at followup   His # is [phone]  This MyChart message has not been read.  I called and spoke with the pt and went over each question and response with him. He verbalized understanding. Will discuss further at f/u next wk.

## 2023-08-08 NOTE — Telephone Encounter (Signed)
MR- please respond to pt's questions below

## 2023-08-08 NOTE — Telephone Encounter (Signed)
MR is working at the hospital and should be able to respond to the pt's questions via mychart. I have sent communication to him to please respond to this pt's mychart msg. MR to communicate directly via mychart.

## 2023-08-10 NOTE — Telephone Encounter (Signed)
These were answered directly to patient

## 2023-08-12 ENCOUNTER — Ambulatory Visit (HOSPITAL_COMMUNITY): Payer: Medicare Other

## 2023-08-14 ENCOUNTER — Telehealth: Payer: Self-pay | Admitting: Primary Care

## 2023-08-14 ENCOUNTER — Encounter: Payer: Self-pay | Admitting: Primary Care

## 2023-08-14 ENCOUNTER — Ambulatory Visit: Payer: Medicare Other | Admitting: Primary Care

## 2023-08-14 VITALS — BP 126/76 | HR 113 | Temp 97.1°F | Ht 72.0 in | Wt 153.5 lb

## 2023-08-14 DIAGNOSIS — J9611 Chronic respiratory failure with hypoxia: Secondary | ICD-10-CM

## 2023-08-14 DIAGNOSIS — J449 Chronic obstructive pulmonary disease, unspecified: Secondary | ICD-10-CM

## 2023-08-14 MED ORDER — ENSIFENTRINE 3 MG/2.5ML IN SUSP: 3.0000 mg | Freq: Two times a day (BID) | RESPIRATORY_TRACT | 1 refills | Status: DC

## 2023-08-14 MED ORDER — IPRATROPIUM-ALBUTEROL 0.5-2.5 (3) MG/3ML IN SOLN: 3.0000 mL | Freq: Three times a day (TID) | RESPIRATORY_TRACT | 0 refills | Status: DC

## 2023-08-14 MED ORDER — PREDNISONE 10 MG PO TABS: ORAL_TABLET | ORAL | 0 refills | Status: DC

## 2023-08-14 MED ORDER — ENSIFENTRINE 3 MG/2.5ML IN SUSP: 3.0000 mg | Freq: Two times a day (BID) | RESPIRATORY_TRACT | Status: DC

## 2023-08-14 MED ORDER — CIPROFLOXACIN HCL 500 MG PO TABS: 500.0000 mg | ORAL_TABLET | Freq: Two times a day (BID) | ORAL | 0 refills | Status: DC

## 2023-08-14 NOTE — Progress Notes (Signed)
 @Patient  ID: Francisco [name], male    DOB: [birth year], 78 y.o.   MRN: [id number]  No chief complaint on file.   Referring provider: [name], MD  HPI: 78 year old male, former smoker.  Past medical history significant for heart failure, hypertension, pulmonary embolism, stage IV COPD, chronic respiratory failure, malignant neoplasm right lung.  Previous [facility] pulmonary encounter: 05/26/23 - Dr. Geronimo  05/26/2023 -   Chief Complaint  Patient presents with   Follow-up    Pt here to requalify for 02, pt recently has copd flare up. Using inhalers and neb, prednisone  and still on azithromycin . Decrease in energy      HPI Francisco [name] 78 y.o. -returns for follow-up.  Presents with his wife.  Wife is an independent historian as is he.  They are here because the insurance company is asking him to requalify for oxygen despite him having Gold stage IV COPD and on chronic 4 L oxygen.  I turned the oxygen off and his pulse ox within a few minutes dropped to 86%.  He required all of 4 L nasal cannula to correct.  The denying any active flareup but the wife is reporting progressive decline in health status.  She says normally she would push him with the wheelchair to the toilet and then he will be able to self transfer and give a shower.  He is barely able to self transfer.  He is struggling to even do simple activities of daily living such as going to the toilet without significant help.  She does not feel she needs intervention of hospice right now.  She does not feel she needs extra domestic support.  However they did ask questions about life expectancy.  Explained to them the general nature of difficulty with stage IV COPD but did indicate to them that he is in the worse prognostic category because of frequent flareups frailty chronic steroids and also Gold stage IV.  He is renal function is normal though.  He does have a living will and he is a DNR.  Prognosis very well can be a  order of months sometimes can be years.  We talked about MOST form I gave him a copy of that.  I also talked about the concept of hospice [detailed below].  They not ready for this at this point in time.  We moved conversation about potential treatments.  I again discussed Dupixent .  There are 2 studies showing improvement in lung function and reduce flareups but he still does not want to do it.  We talked about the new nebulizer Othyvayre -and he is interested.  Blackbox warnings described and side effects described.  Discussed medicare hospice benefit  - a medicare paid benefit  - for people with terminal qualifying  illness such as IPF, COPD, cancer with statistical prognosis < 6 months for which there is no cure - utilization of hospice shows people live longer paradoxically than those without hospice due to improved attention  - explained hospice locations - home, residential etc.,   - explained respite care options for caregivers  - explained that she could still get treatment for non-hospice diagnosis and still come to office to see me for hospice related diagnosis that i provide support for  - explained that hospice provides nursing, MD, chaplain, volunteer and medications and supplies paid through medicare    08/14/2023- Interim hx  Discussed the use of AI scribe software for clinical note transcription with the patient, who  gave verbal consent to proceed.  Patient presents today for hospital follow-up. Pulmonary history includes stage IV COPD, lung cancer, chronic respiratory failure.  He was admitted for 5 days on January 1st, 2025 for COPD exacerbation and acute on chronic respiratory failure with hypoxia and hypercapnia.  He was initially treated for cough end of December outpatient by Dr. Geronimo with a course of Ciprofloxacin  750mg  BID x 12 days and prednisone  taper. CXR with stable chronic changes without acute abnormality.  Patient was treated with IV steroids/antibiotics and  noninvasive ventilator. Patient was discharged on prednisone  taper.   He is on Symbicort , Spiriva , azithromycin  three times a week (Monday, Wednesday, Friday), prednisone  10mg  daily and uses a BiPAP machine for at least four hours a day to manage CO2 levels, not specifically for sleep apnea. There was confusion regarding the equipment provided, as he was informed he received a BiPAP instead of a ventilator. His oxygen levels at home are in the nineties at rest but drop into the seventies with minimal exertion, even on 4.5 liters of oxygen. He uses a bedside commode to minimize exertion.  He has increased congestion and is not currently taking any medication for it. He coughs up yellowish-green mucus daily, using a suction device to manage secretions. A recent CT scan showed emphysema, bronchial thickening, and lung scarring. Last abx was when he was in the hospital. He has an allergy  to Levaquin , noted for causing hallucinations, but he tolerated recently.   He is considering virtual visits to reduce the difficulty of traveling to appointments as well as hospice care.      Non-invasive Airview download 07/16/2023 - 08/14/2023 Usage days 29/30 days (97%) Average usage 4 hours 27 minutes Air leaks 12 L/min (95%)  AHI 0.9  Allergies  Allergen Reactions   Tape Other (See Comments)    Skin tears easily   Augmentin  [Amoxicillin -Pot Clavulanate] Other (See Comments)    Thrush Sore throat Hoarse voice   Gadavist  [Gadobutrol ] Other (See Comments)    Unknown reaction   Gadolinium Itching, Rash and Other (See Comments)    Itchy back rash   Levaquin  [Levofloxacin ] Other (See Comments)    Hallucinations    Lipitor [Atorvastatin ] Rash    Immunization History  Administered Date(s) Administered   Fluad Quad(high Dose 65+) 03/16/2019, 03/10/2020   Fluad Trivalent(High Dose 65+) 03/14/2023   Influenza Split 04/24/2012   Influenza Whole 05/08/2009, 04/08/2011   Influenza, High Dose Seasonal PF  05/01/2017, 03/31/2018, 04/17/2021, 04/03/2022   Influenza,inj,Quad PF,6+ Mos 04/27/2013, 04/15/2014, 04/24/2015   Influenza,inj,quad, With Preservative 05/01/2018   Influenza-Unspecified 05/13/2016   PFIZER Comirnaty(Gray Top)Covid-19 Tri-Sucrose Vaccine 11/24/2020, 04/03/2022, 03/17/2023   PFIZER(Purple Top)SARS-COV-2 Vaccination 07/30/2019, 08/20/2019, 04/07/2020   Pfizer Covid-19 Vaccine Bivalent Booster 71yrs & up 03/16/2021   Pneumococcal Conjugate-13 02/25/2014   Pneumococcal Polysaccharide-23 08/15/2011, 12/19/2016   Pneumococcal-Unspecified 03/08/2017   Respiratory Syncytial Virus Vaccine,Recomb Aduvanted(Arexvy) 06/06/2022   Td 02/21/2010   Tdap 11/05/2017   Zoster, Live 01/29/2012    Past Medical History:  Diagnosis Date   Acute GI bleeding 02/18/2020   Melena with hemoglobin down to 10 February 2020.  EGD with intervention-follow-up with Dr. Wilhelmenia     Chronic back pain    mid and lower (04/07/2018)   Chronic rhinitis    -Sinus Ct 08/01/2009 >> Bilateral maxillary sinusitis with some mucosal thickeningin the sphenoid and frontal sinuses as well with air fluid levels present -chronic rhinitis flyer Aug 04, 2009   Compressed spine fracture [facility])    COPD (  chronic obstructive pulmonary disease) [facility])    PFT's rec Jul 17, 2009   Dyspnea    Emphysema lung (HCC)    Hyperglycemia 06/12/2016   Iron  deficiency anemia due to chronic blood loss 08/23/2020   Left ankle swelling 07/22/2014   HTN- controlled when fluid status contolled. on triamterene -hctz 37.5-25mg  sparingly  Unclear etiology.   LLE U/S 2016:     Leukocytosis 12/19/2016   Melena 08/11/2022   On home oxygen therapy    3L; 24/7 (04/07/2018)   Onychomycosis    Dr. Naomi   Orthostatic hypotension    since 10/2017 (04/07/2018)   PAD (peripheral artery disease) (HCC)    Pneumonia    twice in 1 year (04/07/2018)   Pulmonary embolism (HCC) 04/07/2018   Skin cancer    lips, face, ears, arms (04/07/2018)    Small cell lung cancer, right (HCC) 2023   Dr. Dewey.  rad   Vertigo    since ~ 02/2018 (04/07/2018)    Tobacco History: Social History   Tobacco Use  Smoking Status Former   Current packs/day: 0.00   Average packs/day: 2.0 packs/day for 52.0 years (104.0 ttl pk-yrs)   Types: Pipe, Cigarettes   Start date: 10/06/1965   Quit date: 10/06/2017   Years since quitting: 5.8   Passive exposure: Never  Smokeless Tobacco Never  Tobacco Comments   04/07/2018 smoked cigarettes years ago; stopped ~ 20 yr ago; stopped smoking pipe in 09/2017   Counseling given: Not Answered Tobacco comments: 04/07/2018 smoked cigarettes years ago; stopped ~ 20 yr ago; stopped smoking pipe in 09/2017   Outpatient Medications Prior to Visit  Medication Sig Dispense Refill   acetaminophen  (TYLENOL ) 500 MG tablet Take 500 mg by mouth every 4 (four) hours as needed for moderate pain, fever, headache or mild pain.     albuterol  (VENTOLIN  HFA) 108 (90 Base) MCG/ACT inhaler Inhale 2 puffs into the lungs every 6 (six) hours as needed for wheezing or shortness of breath.     azithromycin  (ZITHROMAX ) 250 MG tablet Take 1 tablet (250 mg total) by mouth every Monday, Wednesday, and Friday. hold if placed on other antibiotics 40 each 0   Calcium  Carb-Cholecalciferol  (CALCIUM  + D3 PO) Take 1 tablet by mouth daily with breakfast.     docusate sodium  (COLACE) 100 MG capsule Take 2 capsules (200 mg total) by mouth 2 (two) times daily. (Patient taking differently: Take 200 mg by mouth 2 (two) times daily as needed (when taking pain meds).) 10 capsule 0   ELIQUIS  2.5 MG TABS tablet TAKE 1 TABLET BY MOUTH TWICE  DAILY (Patient taking differently: Take 2.5 mg by mouth 2 (two) times daily.) 180 tablet 3   Ferrous Sulfate  (IRON ) 325 (65 Fe) MG TABS Take 325 mg by mouth See admin instructions. Take 325 mg by mouth with food every other day     fluticasone  (FLONASE ) 50 MCG/ACT nasal spray USE 2 SPRAYS IN BOTH  NOSTRILS DAILY (Patient taking  differently: Place 2 sprays into both nostrils daily.) 48 g 3   folic acid  (FOLVITE ) 1 MG tablet Take 1 tablet (1 mg total) by mouth daily. (Patient not taking: Reported on 07/09/2023)     furosemide  (LASIX ) 20 MG tablet Take 1 tablet (20 mg total) by mouth daily as needed for edema (for lower leg edema. do not take for more than 3 days in a row). 90 tablet 0   ipratropium-albuterol  (DUONEB) 0.5-2.5 (3) MG/3ML SOLN Take 3 mLs by nebulization 3 (three) times daily.  810 mL 0   Multiple Vitamin (MULTIVITAMIN WITH MINERALS) TABS tablet Take 1 tablet by mouth daily. (Patient taking differently: Take 1 tablet by mouth daily with breakfast.)     mupirocin  cream (BACTROBAN ) 2 % Apply 1 Application topically 2 (two) times daily. (Patient taking differently: Apply 1 Application topically 2 (two) times daily as needed (as directed).) 30 g 0   OXYGEN Inhale 4.5 L/min into the lungs continuous.     pantoprazole  (PROTONIX ) 20 MG tablet TAKE 1 TABLET BY MOUTH DAILY (Patient taking differently: Take 20 mg by mouth daily before breakfast.) 90 tablet 3   roflumilast  (DALIRESP ) 500 MCG TABS tablet TAKE 1 TABLET BY MOUTH DAILY (Patient taking differently: Take 500 mcg by mouth in the morning.) 90 tablet 3   rosuvastatin  (CRESTOR ) 20 MG tablet Take 1 tablet (20 mg total) by mouth daily. (Patient taking differently: Take 20 mg by mouth every evening.) 90 tablet 3   SYMBICORT  160-4.5 MCG/ACT inhaler USE 2 INHALATIONS BY MOUTH TWICE DAILY (Patient taking differently: Inhale 2 puffs into the lungs 2 (two) times daily.) 30.6 g 3   SYSTANE PRESERVATIVE FREE 0.4-0.3 % SOLN Place 1 drop into both eyes 2 (two) times daily.     Tiotropium Bromide  Monohydrate (SPIRIVA  RESPIMAT) 2.5 MCG/ACT AERS USE 2 INHALATIONS BY MOUTH ONCE  DAILY 12 g 3   traMADol  (ULTRAM ) 50 MG tablet Take 50 mg by mouth every 12 (twelve) hours as needed for severe pain.     No facility-administered medications prior to visit.   Review of Systems  Review of  Systems  Constitutional: Negative.   HENT:  Positive for congestion.   Respiratory:  Positive for cough and shortness of breath.   Cardiovascular: Negative.   Psychiatric/Behavioral:  Negative for sleep disturbance.    Physical Exam  There were no vitals taken for this visit. Physical Exam Constitutional:      General: He is not in acute distress.    Appearance: Normal appearance.     Comments: Chronically ill appearing  HENT:     Head: Normocephalic and atraumatic.  Cardiovascular:     Rate and Rhythm: Normal rate and regular rhythm.  Pulmonary:     Effort: Pulmonary effort is normal.     Breath sounds: Wheezing and rhonchi present.  Skin:    General: Skin is warm and dry.  Neurological:     General: No focal deficit present.     Mental Status: He is alert and oriented to person, place, and time. Mental status is at baseline.  Psychiatric:        Mood and Affect: Mood normal.        Behavior: Behavior normal.        Thought Content: Thought content normal.        Judgment: Judgment normal.      Lab Results:  CBC    Component Value Date/Time   WBC 6.6 07/14/2023 0440   RBC 3.53 (L) 07/14/2023 0440   HGB 10.3 (L) 07/14/2023 0440   HGB 11.1 (L) 12/24/2022 1006   HGB 11.3 (L) 09/03/2022 1352   HCT 32.5 (L) 07/14/2023 0440   HCT 33.7 (L) 09/03/2022 1352   PLT 100 (L) 07/14/2023 0440   PLT 156 12/24/2022 1006   PLT 225 09/03/2022 1352   MCV 92.1 07/14/2023 0440   MCV 91 09/03/2022 1352   MCH 29.2 07/14/2023 0440   MCHC 31.7 07/14/2023 0440   RDW 15.9 (H) 07/14/2023 0440   RDW 12.5 09/03/2022 1352  LYMPHSABS 0.5 (L) 07/14/2023 0440   MONOABS 0.8 07/14/2023 0440   EOSABS 0.0 07/14/2023 0440   BASOSABS 0.0 07/14/2023 0440    BMET    Component Value Date/Time   NA 140 07/14/2023 0440   NA 142 09/03/2022 1352   K 4.0 07/14/2023 0440   CL 91 (L) 07/14/2023 0440   CO2 40 (H) 07/14/2023 0440   GLUCOSE 108 (H) 07/14/2023 0440   BUN 19 07/14/2023 0440   BUN  13 09/03/2022 1352   CREATININE 1.01 07/14/2023 0440   CREATININE 0.94 12/24/2022 1006   CREATININE 1.14 07/06/2020 0941   CALCIUM  7.8 (L) 07/14/2023 0440   GFRNONAA >60 07/14/2023 0440   GFRNONAA >60 12/24/2022 1006   GFRNONAA 64 07/06/2020 0941   GFRAA 74 07/06/2020 0941    BNP    Component Value Date/Time   BNP 221.1 (H) 07/11/2023 0639    ProBNP    Component Value Date/Time   PROBNP 50.0 04/03/2018 1148    Imaging: No results found.   Assessment & Plan:   1. Stage 4 very severe COPD by GOLD classification (HCC) (Primary) - AMB REFERRAL FOR DME  2. Chronic respiratory failure with hypoxia (HCC)     Severe (COPD)  Recent hospitalization for COPD exacerbation with hypoxia. Currently on prednisone  10mg  daily, azithromycin  MWF, and noninvasive ventilation (BiPAP) for at least 4 hours daily. Noted increased congestion and productive cough. -Add Mucinex  or Robitussin for expectorant. -Start Ciprofloxacin  500mg  BID x 10 days along with prednisone  taper -Discontinue Daliresp  as patient is also on Ohtuvayre  (Ensidentrine) nebulizer twice daily.    Chronic respiratory failure - Admitted in December for respiratory failure secondary to COPD exacerbations, O2 levels were in the 70s - Continue BIPAP 4 HOURS DAILY- IVAPs mode/ EPAP 6cm h20 with PS 5-20 - Continue home oxygen therapy at 4-5L to maintain O2 >88-90% - Repeat BMET to check CO2 levels (patient will have labs with PCP next week as the phlebotomist was not present)  - DME order placed to provide patient with larger home oxygen concentrator up to 10L for increased oxygen needs  - Patient is considering hospice care   Medication allergies - Removing Levaquin  from allergy  list. No reports of hallucinations with use.   40 mins spent on case: >50% face to face with patient  Almarie LELON Ferrari, NP 08/14/2023

## 2023-08-14 NOTE — Telephone Encounter (Signed)
 Viritual visitsinfe

## 2023-08-14 NOTE — Telephone Encounter (Signed)
 Refill for Ohtuvayre  sent to [facility]. Once patients are established on treatment, prescriptions can be sent electronically.  Geraldene Kleine, PharmD, MPH, BCPS, CPP Clinical Pharmacist (Rheumatology and Pulmonology)

## 2023-08-14 NOTE — Telephone Encounter (Signed)
 Patient needs refill of Ohtuvayre , Dr. Bertrum Brodie started patient on medication back in November. Family said [facility] was mailing RX

## 2023-08-14 NOTE — Patient Instructions (Addendum)
 -  CHRONIC OBSTRUCTIVE PULMONARY DISEASE (COPD) WITH CHRONIC RESPIRATORY FAILURE: COPD is a chronic lung condition that makes it hard to breathe and can lead to respiratory failure. You were recently hospitalized due to a flare-up. Continue taking your current medications, including prednisone  10mg  daily, azithromycin  three times a week, and using your BiPAP machine for at least 4 hours daily. We are adding Mucinex  or Robitussin to help with congestion and starting an antibiotic for your productive cough. You should stop taking Daliresp  since you are now on Ohtuvayre . We will repeat your labs to check your CO2 levels and consider a larger home oxygen concentrator to meet your oxygen needs.  Recommendations - Stop Daliresp  - Continue Symbicort  2 puffs twice daily - Continue Spiriva  2 puffs daily - Continue Ohtuvayre  nebulizer morning and evening  - Take mucinex  600-1200mg  morning and evening to loosen congestion - Use ipratropium-albuterol  every 6-8 hours for breakthrough shortness of breath - Take ciprofloxacin  for 10 days and prednisone  Dosepak as directed (resume azithromycin  after completing antibiotic) - Continue 4-5L oxygen to maintain O2 >88-90%  - Continue BiPAP 4 hours a day for respiratory failure  Orders: DME order- 10L home oxygen concentrator  Labs (CMET)  Follow-up: March/April with Dr. Chiquita - 15 min slot (will get ok for virtual- book for in patient until then)

## 2023-08-14 NOTE — Telephone Encounter (Signed)
 He is considering virtual visits to reduce the difficulty of traveling to appointments as well as hospice care. Are you ok with seeing him virtually for follow-up in March/April?

## 2023-08-15 ENCOUNTER — Encounter: Payer: Self-pay | Admitting: Family Medicine

## 2023-08-15 ENCOUNTER — Other Ambulatory Visit: Payer: Self-pay | Admitting: Family Medicine

## 2023-08-18 ENCOUNTER — Ambulatory Visit: Payer: Medicare Other | Admitting: Radiation Oncology

## 2023-08-19 ENCOUNTER — Telehealth: Payer: Self-pay | Admitting: Primary Care

## 2023-08-19 MED ORDER — DOXYCYCLINE HYCLATE 100 MG PO TABS: 100.0000 mg | ORAL_TABLET | Freq: Two times a day (BID) | ORAL | 0 refills | Status: DC

## 2023-08-19 NOTE — Telephone Encounter (Signed)
Cipro added to allergy list  I will send in doxycycline (take with food and wear sunscreen if going to be outside)

## 2023-08-19 NOTE — Telephone Encounter (Signed)
LMOM for pt to call back so I can get additional information.

## 2023-08-19 NOTE — Telephone Encounter (Signed)
PT ret Nickie's call.   He said we told him to call his wife's number [phone]) and nurse called his number. He is upset he had to call back and wait on hold again to request a CB.

## 2023-08-19 NOTE — Telephone Encounter (Signed)
I called and spoke with pt. I informed pt of Beth's message. Pt verbalized understanding. NFN

## 2023-08-19 NOTE — Telephone Encounter (Signed)
Patient having reaction to Ciprofloxacin. Would like another antibx called into pharmacy. Pharmacy is [facility]. Patient phone number is [phone].

## 2023-08-19 NOTE — Telephone Encounter (Signed)
He took 3-4 dose of cipro. He stopped last night and he has not had any hallucinations today. He is asking for a new antibiotic to be called in and Cipro to be put on the list of allergies.

## 2023-08-21 ENCOUNTER — Encounter: Payer: Self-pay | Admitting: Family Medicine

## 2023-08-21 ENCOUNTER — Encounter: Payer: Medicare Other | Admitting: Family Medicine

## 2023-08-21 ENCOUNTER — Ambulatory Visit (INDEPENDENT_AMBULATORY_CARE_PROVIDER_SITE_OTHER): Payer: Medicare Other | Admitting: Family Medicine

## 2023-08-21 VITALS — BP 111/74 | HR 67 | Temp 97.9°F | Ht 72.0 in | Wt 153.5 lb

## 2023-08-21 DIAGNOSIS — I10 Essential (primary) hypertension: Secondary | ICD-10-CM

## 2023-08-21 DIAGNOSIS — E785 Hyperlipidemia, unspecified: Secondary | ICD-10-CM

## 2023-08-21 DIAGNOSIS — Z131 Encounter for screening for diabetes mellitus: Secondary | ICD-10-CM | POA: Diagnosis not present

## 2023-08-21 DIAGNOSIS — E559 Vitamin D deficiency, unspecified: Secondary | ICD-10-CM

## 2023-08-21 DIAGNOSIS — I2782 Chronic pulmonary embolism: Secondary | ICD-10-CM

## 2023-08-21 DIAGNOSIS — R739 Hyperglycemia, unspecified: Secondary | ICD-10-CM

## 2023-08-21 DIAGNOSIS — D649 Anemia, unspecified: Secondary | ICD-10-CM | POA: Diagnosis not present

## 2023-08-21 DIAGNOSIS — Z0000 Encounter for general adult medical examination without abnormal findings: Secondary | ICD-10-CM

## 2023-08-21 MED ORDER — MUPIROCIN 2 % EX OINT: 1.0000 | TOPICAL_OINTMENT | Freq: Two times a day (BID) | CUTANEOUS | 1 refills | Status: DC

## 2023-08-21 NOTE — Patient Instructions (Addendum)
Please stop by lab before you go If you have mychart- we will send your results within 3 business days of [place] receiving them.  If you do not have mychart- we will call you about results within 5 business days of [place] receiving them.  *please also note that you will see labs on mychart as soon as they post. I will later go in and write notes on them- will say "notes from Dr. Durene Cal"   Recommended follow up: Return in about 1 year (around 08/20/2024) for physical or sooner if needed.Schedule b4 you leave.

## 2023-08-21 NOTE — Progress Notes (Signed)
Phone: [phone]   Subjective:  Patient presents today for their annual physical. Chief complaint-noted.   See problem oriented charting- ROS- full  review of systems was completed and negative  except for: continued severe issues with breathing  The following were reviewed and entered/updated in epic: Past Medical History:  Diagnosis Date   Acute GI bleeding 02/18/2020   Melena with hemoglobin down to 10 February 2020.  EGD with intervention-follow-up with Dr. Meridee Score     Chronic back pain    "mid and lower" (04/07/2018)   Chronic rhinitis    -Sinus Ct 08/01/2009 >> Bilateral maxillary sinusitis with some mucosal thickeningin the sphenoid and frontal sinuses as well with air fluid levels present -chronic rhinitis flyer Aug 04, 2009   Compressed spine fracture [facility])    COPD (chronic obstructive pulmonary disease) (HCC)    PFT's rec Jul 17, 2009   Dyspnea    Emphysema lung (HCC)    Hyperglycemia 06/12/2016   Iron deficiency anemia due to chronic blood loss 08/23/2020   Left ankle swelling 07/22/2014   HTN- controlled when fluid status contolled. on triamterene-hctz 37.5-25mg  sparingly  Unclear etiology.   LLE U/S 2016:     Leukocytosis 12/19/2016   Melena 08/11/2022   On home oxygen therapy    "3L; 24/7" (04/07/2018)   Onychomycosis    Dr. Melvyn Novas   Orthostatic hypotension    "since 10/2017" (04/07/2018)   PAD (peripheral artery disease) (HCC)    Pneumonia    "twice in 1 year" (04/07/2018)   Pulmonary embolism (HCC) 04/07/2018   Skin cancer    "lips, face, ears, arms" (04/07/2018)   Small cell lung cancer, right (HCC) 2023   Dr. Mitzi Hansen.  rad   Vertigo    "since ~ 02/2018" (04/07/2018)   Patient Active Problem List   Diagnosis Date Noted   Lumbar compression fracture (HCC) 09/11/2022    Priority: High   Chronic systolic congestive heart failure (HCC) 12/10/2021    Priority: High   Malignant neoplasm of lower lobe of right lung (HCC)     Priority: High   PAD (peripheral  artery disease) (HCC) 12/11/2018    Priority: High   Stage 4 very severe COPD by GOLD classification (HCC) 11/27/2018    Priority: High   DNR (do not resuscitate) 04/21/2018    Priority: High   Chronic pulmonary embolism (HCC) 04/07/2018    Priority: High   Osteoporosis 11/19/2017    Priority: High   Thoracic compression fracture (HCC) 11/02/2017    Priority: High   Former smoker 08/23/2008    Priority: High   Vitamin D deficiency 03/03/2023    Priority: Medium    Syncope and collapse 02/20/2023    Priority: Medium    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    Priority: Medium    History of upper GI bleeding 04/30/2020    Priority: Medium    Essential tremor 03/31/2018    Priority: Medium    BPPV (benign paroxysmal positional vertigo), right 02/10/2018    Priority: Medium    Essential hypertension 11/02/2017    Priority: Medium    BPH associated with nocturia 06/25/2017    Priority: Medium    Hyperlipidemia 07/22/2014    Priority: Medium    Pre-operative respiratory examination 12/10/2018    Priority: Low   Venous stasis dermatitis of both lower extremities 11/02/2017    Priority: Low   Chronic respiratory failure with hypoxia and hypercapnia (HCC) - baseline PCO2 in the 70s 09/23/2017  Priority: Low   History of skin cancer 05/17/2014    Priority: Low   Chronic rhinitis 10/28/2012    Priority: Low   Pulmonary nodule 09/23/2011    Priority: Low   History of colonic polyps 08/23/2008    Priority: Low   Acute on chronic respiratory failure with hypoxia and hypercapnia (HCC) 07/11/2023   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   Skin tear of elbow without complication, left, initial encounter 07/10/2023   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   Gastritis and gastroduodenitis 08/12/2022   Acute blood loss anemia (ABLA) 08/12/2022   Hiatal hernia 04/30/2020   Chronic anticoagulation 04/30/2020   Anemia 04/30/2020   COPD with acute  exacerbation (HCC) 11/27/2018   Diastolic dysfunction 04/21/2018   Past Surgical History:  Procedure Laterality Date   ABDOMINAL AORTOGRAM W/LOWER EXTREMITY N/A 01/21/2020   Procedure: ABDOMINAL AORTOGRAM W/LOWER EXTREMITY;  Surgeon: Sherren Kerns, MD;  [facility];  Service: Cardiovascular;  Laterality: N/A;   APPENDECTOMY     BIOPSY  02/20/2020   Procedure: BIOPSY;  Surgeon: Meridee Score Netty Starring., MD;  Location: [facility] ENDOSCOPY;  Service: Gastroenterology;;   BIOPSY  08/12/2022   Procedure: BIOPSY;  Surgeon: Shellia Cleverly, DO;  [facility]: [facility];  Service: Gastroenterology;;   BRONCHIAL BIOPSY  09/04/2021   Procedure: BRONCHIAL BIOPSIES;  Surgeon: Josephine Igo, DO;  [facility]: [facility];  Service: Pulmonary;;   BRONCHIAL BRUSHINGS  09/04/2021   Procedure: BRONCHIAL BRUSHINGS;  Surgeon: Josephine Igo, DO;  [facility];  Service: Pulmonary;;   BRONCHIAL NEEDLE ASPIRATION BIOPSY  09/04/2021   Procedure: BRONCHIAL NEEDLE ASPIRATION BIOPSIES;  Surgeon: Josephine Igo, DO;  [facility]OS[facility];  Service: Pulmonary;;   CATARACT EXTRACTION, BILATERAL     ENDARTERECTOMY FEMORAL Right 04/19/2019   Procedure: ENDARTERECTOMY RIGHT COMMON FEMORAL;  Surgeon: Sherren Kerns, MD;  Location: [facility] OR;  Service: Vascular;  Laterality: Right;   ENDARTERECTOMY FEMORAL Left 01/24/2020   Procedure: LEFT FEMORAL ENDARTERECTOMY WITH DACRON PATCH ANGIOPLASTY;  Surgeon: Sherren Kerns, MD;  [facility];  Service: Vascular;  Laterality: Left;   ESOPHAGOGASTRODUODENOSCOPY (EGD) WITH PROPOFOL N/A 02/20/2020   Procedure: ESOPHAGOGASTRODUODENOSCOPY (EGD) WITH PROPOFOL;  Surgeon: Lemar Lofty., MD;  Location: [facility] ENDOSCOPY;  Service: Gastroenterology;  Laterality: N/A;   ESOPHAGOGASTRODUODENOSCOPY (EGD) WITH PROPOFOL N/A 08/12/2022   Procedure: ESOPHAGOGASTRODUODENOSCOPY (EGD) WITH PROPOFOL;  Surgeon: Shellia Cleverly, DO;  [facility];  Service:  Gastroenterology;  Laterality: N/A;   FEMORAL ENDARTERECTOMY Left 01/24/2020   FIDUCIAL MARKER PLACEMENT  09/04/2021   Procedure: FIDUCIAL MARKER PLACEMENT;  Surgeon: Josephine Igo, DO;  [facility];  Service: Pulmonary;;   HEMOSTASIS CLIP PLACEMENT  02/20/2020   Procedure: HEMOSTASIS CLIP PLACEMENT;  Surgeon: Lemar Lofty., MD;  Location: [facility] ENDOSCOPY;  Service: Gastroenterology;;   HEMOSTASIS CLIP PLACEMENT  08/12/2022   Procedure: HEMOSTASIS CLIP PLACEMENT;  Surgeon: Shellia Cleverly, DO;  [facility];  Service: Gastroenterology;;   HOT HEMOSTASIS N/A 02/20/2020   Procedure: HOT HEMOSTASIS (ARGON PLASMA COAGULATION/BICAP);  Surgeon: Lemar Lofty., MD;  Location: [facility] ENDOSCOPY;  Service: Gastroenterology;  Laterality: N/A;   HOT HEMOSTASIS N/A 08/12/2022   Procedure: HOT HEMOSTASIS (ARGON PLASMA COAGULATION/BICAP);  Surgeon: Shellia Cleverly, DO;  Location: [facility] ENDOSCOPY;  Service: Gastroenterology;  Laterality: N/A;   INSERTION OF ILIAC STENT Left 01/24/2020   Procedure: INSERTION OF VBX STENT 8X59 AND 8X39 LEFT COMMON ILIAC ARTERY. INSERTION OF INNOVA 7 X 60 INNOVA STENT  LEFT EXTERNAL ILIAC ARTERY. ;  Surgeon: Sherren Kerns, MD;  Location: [facility] OR;  Service: Vascular;  Laterality: Left;   LOWER EXTREMITY ANGIOGRAPHY  12/11/2018   LOWER EXTREMITY ANGIOGRAPHY N/A 12/11/2018   Procedure: LOWER EXTREMITY ANGIOGRAPHY;  Surgeon: Sherren Kerns, MD;  [facility];  Service: Cardiovascular;  Laterality: N/A;   PATCH ANGIOPLASTY Right 04/19/2019   Procedure: Patch Angioplasty;  Surgeon: Sherren Kerns, MD;  Location: [facility] OR;  Service: Vascular;  Laterality: Right;   PERIPHERAL VASCULAR INTERVENTION Right 12/11/2018   Procedure: PERIPHERAL VASCULAR INTERVENTION;  Surgeon: Sherren Kerns, MD;  [facility];  Service: Cardiovascular;  Laterality: Right;  Common Iliac    SKIN CANCER EXCISION     "lips, face, ears, arms"  (04/07/2018)   SUBMUCOSAL INJECTION  08/12/2022   Procedure: SUBMUCOSAL INJECTION;  Surgeon: Shellia Cleverly, DO;  [facility];  Service: Gastroenterology;;   TONSILLECTOMY     ULTRASOUND GUIDANCE FOR VASCULAR ACCESS Right 01/24/2020   Procedure: ULTRASOUND GUIDANCE FOR VASCULAR ACCESS;  Surgeon: Sherren Kerns, MD;  Location: [facility] OR;  Service: Vascular;  Laterality: Right;   VIDEO BRONCHOSCOPY WITH RADIAL ENDOBRONCHIAL ULTRASOUND  09/04/2021   Procedure: VIDEO BRONCHOSCOPY WITH RADIAL ENDOBRONCHIAL ULTRASOUND;  Surgeon: Josephine Igo, DO;  Location[facility] ENDOS[facility];  Service: Pulmonary;;    Family History  Problem Relation Age of Onset   Heart disease Mother        CABG in her 70s, nonsmoker   Cancer Mother    Stroke Father    Heart disease Father        Died of MI at age 66, smoker   Hepatitis Sister    Coronary artery disease Other        male 1st degree relative <60   Colon cancer Neg Hx    Pancreatic cancer Neg Hx    Esophageal cancer Neg Hx    Inflammatory bowel disease Neg Hx    Liver disease Neg Hx    Rectal cancer Neg Hx    Stomach cancer Neg Hx     Medications- reviewed and updated Current Outpatient Medications  Medication Sig Dispense Refill   acetaminophen (TYLENOL) 500 MG tablet Take 500 mg by mouth every 4 (four) hours as needed for moderate pain, fever, headache or mild pain.     albuterol (VENTOLIN HFA) 108 (90 Base) MCG/ACT inhaler Inhale 2 puffs into the lungs every 6 (six) hours as needed for wheezing or shortness of breath.     Calcium Carb-Cholecalciferol (CALCIUM + D3 PO) Take 1 tablet by mouth daily with breakfast.     docusate sodium (COLACE) 100 MG capsule Take 2 capsules (200 mg total) by mouth 2 (two) times daily. (Patient taking differently: Take 200 mg by mouth 2 (two) times daily as needed (when taking pain meds).) 10 capsule 0   doxycycline (VIBRA-TABS) 100 MG tablet Take 1 tablet (100 mg total) by mouth 2 (two) times daily. 14 tablet  0   ELIQUIS 2.5 MG TABS tablet TAKE 1 TABLET BY MOUTH TWICE  DAILY (Patient taking differently: Take 2.5 mg by mouth 2 (two) times daily.) 180 tablet 3   Ensifentrine (OHTUVAYRE) 3 MG/2.5ML SUSP Inhale into the lungs. Twice a day     Ferrous Sulfate (IRON) 325 (65 Fe) MG TABS Take 325 mg by mouth See admin instructions. Take 325 mg by mouth with food every other day     fluticasone (FLONASE) 50 MCG/ACT nasal spray USE  2 SPRAYS IN BOTH  NOSTRILS DAILY (Patient taking differently: Place 2 sprays into both nostrils daily.) 48 g 3   folic acid (FOLVITE) 1 MG tablet Take 1 tablet (1 mg total) by mouth daily.     furosemide (LASIX) 20 MG tablet Take 1 tablet (20 mg total) by mouth daily as needed for edema (for lower leg edema. do not take for more than 3 days in a row). (Patient taking differently: Take 20 mg by mouth daily.) 90 tablet 0   ipratropium-albuterol (DUONEB) 0.5-2.5 (3) MG/3ML SOLN Take 3 mLs by nebulization 3 (three) times daily. 810 mL 0   Multiple Vitamin (MULTIVITAMIN WITH MINERALS) TABS tablet Take 1 tablet by mouth daily. (Patient taking differently: Take 1 tablet by mouth daily with breakfast.)     mupirocin cream (BACTROBAN) 2 % Apply 1 Application topically 2 (two) times daily. (Patient taking differently: Apply 1 Application topically 2 (two) times daily as needed (as directed).) 30 g 0   OXYGEN Inhale 4.5 L/min into the lungs continuous.     pantoprazole (PROTONIX) 20 MG tablet TAKE 1 TABLET BY MOUTH DAILY (Patient taking differently: Take 20 mg by mouth daily before breakfast.) 90 tablet 3   predniSONE (DELTASONE) 10 MG tablet Take 10 mg by mouth daily with breakfast.     rosuvastatin (CRESTOR) 20 MG tablet TAKE 1 TABLET BY MOUTH DAILY 90 tablet 3   SYMBICORT 160-4.5 MCG/ACT inhaler USE 2 INHALATIONS BY MOUTH TWICE DAILY (Patient taking differently: Inhale 2 puffs into the lungs 2 (two) times daily.) 30.6 g 3   SYSTANE PRESERVATIVE FREE 0.4-0.3 % SOLN Place 1 drop into both eyes 2  (two) times daily.     Tiotropium Bromide Monohydrate (SPIRIVA RESPIMAT) 2.5 MCG/ACT AERS USE 2 INHALATIONS BY MOUTH ONCE  DAILY 12 g 3   traMADol (ULTRAM) 50 MG tablet Take 50 mg by mouth every 12 (twelve) hours as needed for severe pain.     azithromycin (ZITHROMAX) 250 MG tablet Take 1 tablet (250 mg total) by mouth every Monday, Wednesday, and Friday. hold if placed on other antibiotics 40 each 0   Ensifentrine 3 MG/2.5ML SUSP Inhale 3 mg into the lungs in the morning and at bedtime. (Patient not taking: Reported on 08/21/2023) 450 mL 1   predniSONE (DELTASONE) 10 MG tablet Take 4 tablets (40 mg total) by mouth daily with breakfast for 4 days, THEN 3 tablets (30 mg total) daily with breakfast for 4 days, THEN 2 tablets (20 mg total) daily with breakfast for 4 days. 36 tablet 0   No current facility-administered medications for this visit.    Allergies-reviewed and updated Allergies  Allergen Reactions   Tape Other (See Comments)    Skin tears easily   Augmentin [Amoxicillin-Pot Clavulanate] Other (See Comments)    Thrush Sore throat Hoarse voice   Ciprofloxacin Other (See Comments)    hallucinations   Gadavist [Gadobutrol] Other (See Comments)    Unknown reaction   Gadolinium Itching, Rash and Other (See Comments)    Itchy back rash   Lipitor [Atorvastatin] Rash    Social History   Social History Narrative   Married (wife Jonna Clark patient of Dr. Durene Cal) with children (2-son and daughter). 1 grandson.       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       Hobbies: fishing, projects at home   Objective  Objective:  BP 111/74   Pulse 67   Temp 97.9 F (36.6 C)  Ht 6' (1.829 m)   Wt 153 lb 8 oz (69.6 kg)   SpO2 97%   BMI 20.82 kg/m  Gen: NAD, on 4 L of oxygen by nasal cannula HEENT: Mucous membranes are moist. Oropharynx normal Neck: no thyromegaly CV: RRR no murmurs rubs or gallops Lungs: Diffuse coarse breath sounds Abdomen:  soft/nontender/nondistended/normal bowel sounds. No rebound or guarding.  Ext: Trace edema under compression stockings Skin: warm, dry Neuro: grossly normal, moves all extremities, PERRLA    Assessment and Plan  78 y.o. male presenting for annual physical.  Health Maintenance counseling: 1. Anticipatory guidance: Patient counseled regarding regular dental exams -q6 months, eye exams -yearly,  avoiding smoking and second hand smoke, limiting alcohol to 2 beverages per day- down from 35/week in 2023- up to 4 beers a day but sometimes none at all-still recommended cessation, no illicit drugs.   2. Risk factor reduction:  Advised patient of need for regular exercise and diet rich and fruits and vegetables to reduce risk of heart attack and stroke.  Exercise-not able to exercise with lung health Diet/weight management--Down 21 pounds in the last 20 months-some of this likely from diuresis/fluid overload.  Wt Readings from Last 3 Encounters:  08/21/23 153 lb 8 oz (69.6 kg)  08/14/23 153 lb 8 oz (69.6 kg)  05/26/23 164 lb (74.4 kg)  3. Immunizations/screenings/ancillary studies-discussed option of Shingrix  Immunization History  Administered Date(s) Administered   Fluad Quad(high Dose 65+) 03/16/2019, 03/10/2020   Fluad Trivalent(High Dose 65+) 03/14/2023   Influenza Split 04/24/2012   Influenza Whole 05/08/2009, 04/08/2011   Influenza, High Dose Seasonal PF 05/01/2017, 03/31/2018, 04/17/2021, 04/03/2022   Influenza,inj,Quad PF,6+ Mos 04/27/2013, 04/15/2014, 04/24/2015   Influenza,inj,quad, With Preservative 05/01/2018   Influenza-Unspecified 05/13/2016   PFIZER Comirnaty(Gray Top)Covid-19 Tri-Sucrose Vaccine 11/24/2020, 04/03/2022, 03/17/2023   PFIZER(Purple Top)SARS-COV-2 Vaccination 07/30/2019, 08/20/2019, 04/07/2020   Pfizer Covid-19 Vaccine Bivalent Booster 31yrs & up 03/16/2021   Pneumococcal Conjugate-13 02/25/2014   Pneumococcal Polysaccharide-23 08/15/2011, 12/19/2016    Pneumococcal-Unspecified 03/08/2017   Respiratory Syncytial Virus Vaccine,Recomb Aduvanted(Arexvy) 06/06/2022   Td 02/21/2010   Tdap 11/05/2017   Zoster, Live 01/29/2012   4. Prostate cancer screening- past age based screening recommendations     5. Colon cancer screening - we have opted out of colon cancer screening from palliative discussion 6. Skin cancer screening-Dr. Yetta Barre dermatology. advised regular sunscreen use. Denies worrisome, changing, or new skin lesions.  7. Smoking associated screening (lung cancer screening, AAA screen 65-75, UA)-former smoker- lung cancer being monitored by oncology 8. STD screening -only active with wife  Status of chronic or acute concerns   #very tough recent hospitalization- did not do well at all with ciprofloxacin- had severe hallucinations- plans to avoid this in future- on allergy list.   # Small cell lung cancer-status post radiation treatment with scans every 6 months- just done in january-does have MRI march 3rd of brain to follow up t2 weigted signal which may just be small vessel ischemia but in light of cancer want to be sure  # Chronic pulmonary embolism-previously on Xarelto which was stopped and later started on Eliquis 2.5 mg twice daily which is technically below clotting dose for prevention and is more of an A-fib dose but due to prior GI bleed have kept on lower dose   #History of GI bleed/anemia-patient remains on iron-update CBC and iron stores. Does have some anemia.    # Systolic congestive heart failure/chronic edema-patient reports taking Lasix daily at this point-also controlling hypertension- finally able  to get shoes on after 2 years with this more consistent. Monitoring weights at home- not trending up  # Severe COPD on chronic 4 L of oxygen-follows closely with pulmonology and requires frequent antibiotics-also on ensifentrine twice a day- name brand ohtuvayre, duonebs as needed, chronic prednisone 10 mg, Symbicort 160-4.5 mcg  2 puffs twice daily as well as Spiriva and azithromycin Monday Wednesday Friday -Recently declined Dupixent -They have discussed possible hospice with pulmonary  #hyperlipidemia S: Medication:Rosuvastatin 20 mg Lab Results  Component Value Date   CHOL 168 02/21/2023   HDL 83 02/21/2023   LDLCALC 73 02/21/2023   LDLDIRECT 72.0 12/07/2020   TRIG 62 02/21/2023   CHOLHDL 2.0 02/21/2023   A/P: close to ideal goal - continue current medications   # Osteoporosis-history of thoracic spine compression fractures- has declined bone density in past and again today  # Pancreatic cyst-due to overall health has not pursued repeat imaging.   # Chronic back pain-has seen [name] in the past for this and required oxycodone- doing better lately   # Osteoporosis-received Reclast 12/26/2020 and but had issues with bleeding around time of infusion-he would like to hold off on repeat injection and repeat bone density despite prior compressoin fracture  Recommended follow up: No follow-ups on file. Future Appointments  Date Time Provider Department Center  09/08/2023 10:00 AM [facility] 2 [facility]  09/16/2023  8:30 AM Ronny Bacon, PA-C [facility] None  10/13/2023 11:30 AM Kalman Shan, MD [facility] None   Lab/Order associations:NOT fasting   ICD-10-CM   1. Preventative health care  Z00.00     2. Other chronic pulmonary embolism, unspecified whether acute cor pulmonale present (HCC)  I27.82     3. Essential hypertension  I10     4. Hyperlipidemia, unspecified hyperlipidemia type  E78.5     5. Vitamin D deficiency  E55.9       No orders of the defined types were placed in this encounter.   Return precautions advised.  Tana Conch, MD

## 2023-08-22 ENCOUNTER — Encounter: Payer: Self-pay | Admitting: Family Medicine

## 2023-08-22 LAB — CBC WITH DIFFERENTIAL/PLATELET
Basophils Absolute: 0.1 10*3/uL (ref 0.0–0.1)
Basophils Relative: 0.5 % (ref 0.0–3.0)
Eosinophils Absolute: 0.1 10*3/uL (ref 0.0–0.7)
Eosinophils Relative: 0.6 % (ref 0.0–5.0)
HCT: 34.5 % — ABNORMAL LOW (ref 39.0–52.0)
Hemoglobin: 11.1 g/dL — ABNORMAL LOW (ref 13.0–17.0)
Lymphocytes Relative: 5.3 % — ABNORMAL LOW (ref 12.0–46.0)
Lymphs Abs: 0.6 10*3/uL — ABNORMAL LOW (ref 0.7–4.0)
MCHC: 32.3 g/dL (ref 30.0–36.0)
MCV: 92.3 fL (ref 78.0–100.0)
Monocytes Absolute: 0.5 10*3/uL (ref 0.1–1.0)
Monocytes Relative: 4.7 % (ref 3.0–12.0)
Neutro Abs: 10.1 10*3/uL — ABNORMAL HIGH (ref 1.4–7.7)
Neutrophils Relative %: 88.9 % — ABNORMAL HIGH (ref 43.0–77.0)
Platelets: 221 10*3/uL (ref 150.0–400.0)
RBC: 3.74 Mil/uL — ABNORMAL LOW (ref 4.22–5.81)
RDW: 15.9 % — ABNORMAL HIGH (ref 11.5–15.5)
WBC: 11.4 10*3/uL — ABNORMAL HIGH (ref 4.0–10.5)

## 2023-08-22 LAB — COMPREHENSIVE METABOLIC PANEL
ALT: 19 U/L (ref 0–53)
AST: 25 U/L (ref 0–37)
Albumin: 3.7 g/dL (ref 3.5–5.2)
Alkaline Phosphatase: 59 U/L (ref 39–117)
BUN: 19 mg/dL (ref 6–23)
CO2: 42 meq/L — ABNORMAL HIGH (ref 19–32)
Calcium: 9.8 mg/dL (ref 8.4–10.5)
Chloride: 92 meq/L — ABNORMAL LOW (ref 96–112)
Creatinine, Ser: 0.87 mg/dL (ref 0.40–1.50)
GFR: 84.16 mL/min (ref >60.00)
Glucose, Bld: 94 mg/dL (ref 70–99)
Potassium: 5.1 meq/L (ref 3.5–5.1)
Sodium: 139 meq/L (ref 135–145)
Total Bilirubin: 0.5 mg/dL (ref 0.2–1.2)
Total Protein: 6.3 g/dL (ref 6.0–8.3)

## 2023-08-22 LAB — HEMOGLOBIN A1C: Hgb A1c MFr Bld: 5.3 % (ref 4.6–6.5)

## 2023-08-22 LAB — IBC + FERRITIN
Ferritin: 23.8 ng/mL (ref 22.0–322.0)
Iron: 47 ug/dL (ref 42–165)
Saturation Ratios: 10.7 % — ABNORMAL LOW (ref 20.0–50.0)
TIBC: 441 ug/dL (ref 250.0–450.0)
Transferrin: 315 mg/dL (ref 212.0–360.0)

## 2023-08-22 LAB — LIPID PANEL
Cholesterol: 201 mg/dL — ABNORMAL HIGH (ref 0–200)
HDL: 91.7 mg/dL (ref >39.00)
LDL Cholesterol: 96 mg/dL (ref 0–99)
NonHDL: 109.22
Total CHOL/HDL Ratio: 2
Triglycerides: 66 mg/dL (ref 0.0–149.0)
VLDL: 13.2 mg/dL (ref 0.0–40.0)

## 2023-08-22 LAB — VITAMIN D 25 HYDROXY (VIT D DEFICIENCY, FRACTURES): VITD: 68.85 ng/mL (ref 30.00–100.00)

## 2023-08-25 ENCOUNTER — Other Ambulatory Visit: Payer: Self-pay

## 2023-08-25 MED ORDER — ROSUVASTATIN CALCIUM 40 MG PO TABS: 20.0000 mg | ORAL_TABLET | Freq: Every day | ORAL | 3 refills | Status: DC

## 2023-08-25 NOTE — Telephone Encounter (Signed)
 Please see patient response and advise.

## 2023-08-27 ENCOUNTER — Other Ambulatory Visit: Payer: Self-pay | Admitting: *Deleted

## 2023-08-27 MED ORDER — ROSUVASTATIN CALCIUM 40 MG PO TABS: 40.0000 mg | ORAL_TABLET | Freq: Every day | ORAL | 3 refills | Status: AC

## 2023-08-27 NOTE — Telephone Encounter (Signed)
Dr. Durene Cal, please clarify Rx for Rosuvastatin? Directions sent were 40 mg take 1/2 tablet daily. Is it suppose to be full tablet?

## 2023-08-27 NOTE — Telephone Encounter (Signed)
Called and spoke to pt and his wife, told them Dr. Durene Cal wants you to take 40 mg daily of the Rosuvastatin. The Rx was sent in wrong I will send in a new Rx do you want [facility] or [facility]? They said [facility] to [facility]. Told them okay, meanwhile you can take 2 of the 20 mg tablets. Both verbalized understanding. In regards to Home Health looks like it was sent in requesting wound care back in Jan 7th. Pt's wife said it is all healed. Asked her did you just get a call now? Pt's wife said yes. Told her if it is all healed and no problem then you do not need it. Pt's wife verbalized understanding. Rx sent.

## 2023-09-01 ENCOUNTER — Telehealth: Payer: Self-pay | Admitting: Primary Care

## 2023-09-01 NOTE — Telephone Encounter (Signed)
 Patient's wife is wondering if patient needs follow up blood work due to his last blood work. His blood gases were high. He has a brain MRI coming up this Monday March 3rd and is wondering if he can get his blood drawn then. Please call and advise [phone]

## 2023-09-02 NOTE — Telephone Encounter (Signed)
 Patient's wife inquiring as to if he should repeat ABG due to elevation in January. He is scheduled for MRI 09/08/23.@ [facility] and she would like to have labs drawn there if needed.

## 2023-09-03 NOTE — Telephone Encounter (Signed)
 Called patient.  Patient states he is on a BIPAP, not a ventilator at night.  Do you still want patient to repeat ABG labs?  Patient is getting his MRI on 09/08/23 at [facility] and can get his labs drawn at this time.  Please advise.  Thank you!

## 2023-09-03 NOTE — Telephone Encounter (Signed)
 I believe we had discussed this but we can get ABGs to re-assess CO2 levels now that he is on ventilator support at night. Can you please order re: chronic respiratory failure

## 2023-09-03 NOTE — Telephone Encounter (Signed)
 Yes

## 2023-09-04 NOTE — Telephone Encounter (Signed)
 I called and spoke with the pt's wife, dpr. I informed the wife that yes, Waynetta Sandy still wanted the pt to have the labs drawn and it wasn't an issue to have this done at the hospital while he was already there to have an MRI done. I informed wife that I would route this to [facility] for the lab order to make sure the order was exactly what she was wanting. Pt wife verbalized understanding. NFN, Routing to Buelah Manis, NP

## 2023-09-06 DIAGNOSIS — J9611 Chronic respiratory failure with hypoxia: Secondary | ICD-10-CM

## 2023-09-06 DIAGNOSIS — J449 Chronic obstructive pulmonary disease, unspecified: Secondary | ICD-10-CM

## 2023-09-08 ENCOUNTER — Encounter (HOSPITAL_COMMUNITY): Payer: Self-pay

## 2023-09-08 ENCOUNTER — Ambulatory Visit (HOSPITAL_COMMUNITY)
Admission: RE | Admit: 2023-09-08 | Discharge: 2023-09-08 | Disposition: A | Discharge status: Home / Self Care | Source: Ambulatory Visit | Attending: Radiation Oncology | Admitting: Radiation Oncology

## 2023-09-08 ENCOUNTER — Other Ambulatory Visit: Payer: Self-pay

## 2023-09-08 ENCOUNTER — Ambulatory Visit (HOSPITAL_COMMUNITY): Admission: RE | Admit: 2023-09-08 | Payer: Medicare Other | Source: Ambulatory Visit

## 2023-09-08 DIAGNOSIS — C7931 Secondary malignant neoplasm of brain: Secondary | ICD-10-CM | POA: Insufficient documentation

## 2023-09-08 NOTE — Telephone Encounter (Signed)
 ABGs should be ordered under PFT

## 2023-09-10 ENCOUNTER — Other Ambulatory Visit: Payer: Self-pay | Admitting: Family Medicine

## 2023-09-10 ENCOUNTER — Other Ambulatory Visit: Payer: Self-pay | Admitting: Primary Care

## 2023-09-15 NOTE — Progress Notes (Signed)
 Radiation Oncology         [phone]) [phone] ________________________________  Outpatient Follow Up- Conducted via telephone at patient request.  I spoke with the patient to conduct this visit via telephone. The patient was notified in advance and was offered an in person or telemedicine meeting to allow for face to face communication but instead preferred to proceed with a telephone visit.    Name: Francisco [name]        MRN: [id number]  Date of Service: 09/16/2023 DOB: 1946-04-20  WN:[id number], Aldine Contes, MD  Josephine Igo, DO     REFERRING PHYSICIAN: Josephine Igo, DO   DIAGNOSIS: The encounter diagnosis was Malignant neoplasm of lower lobe of right lung (HCC).   HISTORY OF PRESENT ILLNESS: Francisco [name] is a 78 y.o. 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  In January 2023 a RLL nodule was noted by CT. On 08/08/2021 he underwent PET imaging that showed a solitary hypermetabolic pulmonary nodule in the right lower lobe with intense hypermetabolism with an SUV up to 7.7, the lesion measured 13 mm.  No evidence of metastatic disease was identified.  He underwent bronchoscopy on 09/04/2021 and cytology showed small cell carcinoma. He received stereotactic body radiotherapy (SBRT) to the RLL and is followed in surveillance with Dr. Arbutus Ped. He declined prophylactic cranial irradiation, but desired close surveillance with MRI imaging.   Since his treatment to the RLL he has been without new or progressive disease. He has atherosclerotic changes, cirrhotic changes, and a lesion in the pancreas that has been stable, and compression changes in the thoracic spine were noted. He has been followed by [name] for this.   His brain has also remained without disease.  He has preferred to have scans at 23-month intervals since, and prefers scans without IV contrast due to hypersensitivity/allergy to Gadavist/gadolinium based products. His most recent scan was on 09/08/23  showed   PREVIOUS RADIATION THERAPY: Yes   10/16/21-10/23/21 SBRT Treatment The tumor in the RLL was treated with a course of stereotactic body radiation treatment. The patient received 54 Gy In 3 fractions at 18 Gy per fraction.   PAST MEDICAL HISTORY:  Past Medical History:  Diagnosis Date   Acute GI bleeding 02/18/2020   Melena with hemoglobin down to 10 February 2020.  EGD with intervention-follow-up with Dr. Meridee Score     Allergy    Blood transfusion without reported diagnosis    Chronic back pain    "mid and lower" (04/07/2018)   Chronic rhinitis    -Sinus Ct 08/01/2009 >> Bilateral maxillary sinusitis with some mucosal thickeningin the sphenoid and frontal sinuses as well with air fluid levels present -chronic rhinitis flyer Aug 04, 2009   Clotting disorder [facility]) 2022@2024    Internal bleed   Compressed spine fracture (HCC)    COPD (chronic obstructive pulmonary disease) (HCC)    PFT's rec Jul 17, 2009   Dyspnea    Emphysema lung (HCC)    Emphysema of lung (HCC)    GERD (gastroesophageal reflux disease)    Hyperglycemia 06/12/2016   Hypertension 2/24   Questionable   Iron deficiency anemia due to chronic blood loss 08/23/2020   Left ankle swelling 07/22/2014   HTN- controlled when fluid status contolled. on triamterene-hctz 37.5-25mg  sparingly  Unclear etiology.   LLE U/S 2016:     Leukocytosis 12/19/2016   Melena 08/11/2022   On home oxygen therapy    "3L; 24/7" (04/07/2018)  Onychomycosis    Dr. Melvyn Novas   Orthostatic hypotension    "since 10/2017" (04/07/2018)   Osteoporosis    Oxygen deficiency    PAD (peripheral artery disease) (HCC)    Pneumonia    "twice in 1 year" (04/07/2018)   Pulmonary embolism (HCC) 04/07/2018   Skin cancer    "lips, face, ears, arms" (04/07/2018)   Small cell lung cancer, right (HCC) 2023   Dr. Mitzi Hansen.  rad   Vertigo    "since ~ 02/2018" (04/07/2018)       PAST SURGICAL HISTORY: Past Surgical History:  Procedure Laterality Date    ABDOMINAL AORTOGRAM W/LOWER EXTREMITY N/A 01/21/2020   Procedure: ABDOMINAL AORTOGRAM W/LOWER EXTREMITY;  Surgeon: Sherren Kerns, MD;  [facility];  Service: Cardiovascular;  Laterality: N/A;   APPENDECTOMY     BIOPSY  02/20/2020   Procedure: BIOPSY;  Surgeon: Meridee Score Netty Starring., MD;  Location: [facility] ENDOSCOPY;  Service: Gastroenterology;;   BIOPSY  08/12/2022   Procedure: BIOPSY;  Surgeon: Shellia Cleverly, DO;  Location: [facility] ENDOSCOPY;  Service: Gastroenterology;;   BRONCHIAL BIOPSY  09/04/2021   Procedure: BRONCHIAL BIOPSIES;  Surgeon: Josephine Igo, DO;  Location: [facility] ENDOSCOPY;  Service: Pulmonary;;   BRONCHIAL BRUSHINGS  09/04/2021   Procedure: BRONCHIAL BRUSHINGS;  Surgeon: Josephine Igo, DO;  [facility]OS[facility];  Service: Pulmonary;;   BRONCHIAL NEEDLE ASPIRATION BIOPSY  09/04/2021   Procedure: BRONCHIAL NEEDLE ASPIRATION BIOPSIES;  Surgeon: Josephine Igo, DO;  [facility]: [facility] ENDOS[facility];  Service: Pulmonary;;   CATARACT EXTRACTION, BILATERAL     CHOLECYSTECTOMY     ENDARTERECTOMY FEMORAL Right 04/19/2019   Procedure: ENDARTERECTOMY RIGHT COMMON FEMORAL;  Surgeon: Sherren Kerns, MD;  Location: [facility] OR;  Service: Vascular;  Laterality: Right;   ENDARTERECTOMY FEMORAL Left 01/24/2020   Procedure: LEFT FEMORAL ENDARTERECTOMY WITH DACRON PATCH ANGIOPLASTY;  Surgeon: Sherren Kerns, MD;  [facility];  Service: Vascular;  Laterality: Left;   ESOPHAGOGASTRODUODENOSCOPY (EGD) WITH PROPOFOL N/A 02/20/2020   Procedure: ESOPHAGOGASTRODUODENOSCOPY (EGD) WITH PROPOFOL;  Surgeon: Lemar Lofty., MD;  Location: [facility] ENDOSCOPY;  Service: Gastroenterology;  Laterality: N/A;   ESOPHAGOGASTRODUODENOSCOPY (EGD) WITH PROPOFOL N/A 08/12/2022   Procedure: ESOPHAGOGASTRODUODENOSCOPY (EGD) WITH PROPOFOL;  Surgeon: Shellia Cleverly, DO;  [facility];  Service: Gastroenterology;  Laterality: N/A;   EYE SURGERY  2019   Cataract   FEMORAL  ENDARTERECTOMY Left 01/24/2020   FIDUCIAL MARKER PLACEMENT  09/04/2021   Procedure: FIDUCIAL MARKER PLACEMENT;  Surgeon: Josephine Igo, DO;  [facility]OS[facility];  Service: Pulmonary;;   FRACTURE SURGERY  2019   HEMOSTASIS CLIP PLACEMENT  02/20/2020   Procedure: HEMOSTASIS CLIP PLACEMENT;  Surgeon: Lemar Lofty., MD;  Location: [facility] ENDOSCOPY;  Service: Gastroenterology;;   HEMOSTASIS CLIP PLACEMENT  08/12/2022   Procedure: HEMOSTASIS CLIP PLACEMENT;  Surgeon: Shellia Cleverly, DO;  [facility];  Service: Gastroenterology;;   HOT HEMOSTASIS N/A 02/20/2020   Procedure: HOT HEMOSTASIS (ARGON PLASMA COAGULATION/BICAP);  Surgeon: Lemar Lofty., MD;  Location: [facility] ENDOSCOPY;  Service: Gastroenterology;  Laterality: N/A;   HOT HEMOSTASIS N/A 08/12/2022   Procedure: HOT HEMOSTASIS (ARGON PLASMA COAGULATION/BICAP);  Surgeon: Shellia Cleverly, DO;  Location: [facility] ENDOSCOPY;  Service: Gastroenterology;  Laterality: N/A;   INSERTION OF ILIAC STENT Left 01/24/2020   Procedure: INSERTION OF VBX STENT 8X59 AND 8X39 LEFT COMMON ILIAC ARTERY. INSERTION OF INNOVA 7 X 60 INNOVA STENT LEFT EXTERNAL ILIAC ARTERY. ;  Surgeon: Sherren Kerns, MD;  Location[facility] OR;  Service: Vascular;  Laterality: Left;   LOWER EXTREMITY ANGIOGRAPHY  12/11/2018   LOWER EXTREMITY ANGIOGRAPHY N/A 12/11/2018   Procedure: LOWER EXTREMITY ANGIOGRAPHY;  Surgeon: Sherren Kerns, MD;  [facility];  Service: Cardiovascular;  Laterality: N/A;   PATCH ANGIOPLASTY Right 04/19/2019   Procedure: Patch Angioplasty;  Surgeon: Sherren Kerns, MD;  Location: [facility] OR;  Service: Vascular;  Laterality: Right;   PERIPHERAL VASCULAR INTERVENTION Right 12/11/2018   Procedure: PERIPHERAL VASCULAR INTERVENTION;  Surgeon: Sherren Kerns, MD;  [facility];  Service: Cardiovascular;  Laterality: Right;  Common Iliac    SKIN CANCER EXCISION     "lips, face, ears, arms" (04/07/2018)    SUBMUCOSAL INJECTION  08/12/2022   Procedure: SUBMUCOSAL INJECTION;  Surgeon: Shellia Cleverly, DO;  [facility] ENDOS[facility];  Service: Gastroenterology;;   TONSILLECTOMY     ULTRASOUND GUIDANCE FOR VASCULAR ACCESS Right 01/24/2020   Procedure: ULTRASOUND GUIDANCE FOR VASCULAR ACCESS;  Surgeon: Sherren Kerns, MD;  Location: [facility] OR;  Service: Vascular;  Laterality: Right;   VIDEO BRONCHOSCOPY WITH RADIAL ENDOBRONCHIAL ULTRASOUND  09/04/2021   Procedure: VIDEO BRONCHOSCOPY WITH RADIAL ENDOBRONCHIAL ULTRASOUND;  Surgeon: Josephine Igo, DO;  Location: [facility] ENDOSCOPY;  Service: Pulmonary;;     FAMILY HISTORY:  Family History  Problem Relation Age of Onset   Heart disease Mother        CABG in her 25s, nonsmoker   Cancer Mother    Stroke Father    Heart disease Father        Died of MI at age 87, smoker   Hepatitis Sister    Coronary artery disease Other        male 1st degree relative <60   Colon cancer Neg Hx    Pancreatic cancer Neg Hx    Esophageal cancer Neg Hx    Inflammatory bowel disease Neg Hx    Liver disease Neg Hx    Rectal cancer Neg Hx    Stomach cancer Neg Hx      SOCIAL HISTORY:  reports that he quit smoking about 5 years ago. His smoking use included cigarettes and pipe. He started smoking about 57 years ago. He has a 104 pack-year smoking history. He has never been exposed to tobacco smoke. He has never used smokeless tobacco. He reports current alcohol use of about 20.0 standard drinks of alcohol per week. He reports that he does not use drugs. The patient is married and lives in [place].    ALLERGIES: Tape, Augmentin [amoxicillin-pot clavulanate], Ciprofloxacin, Gadavist [gadobutrol], Gadolinium, and Lipitor [atorvastatin]   MEDICATIONS:  Current Outpatient Medications  Medication Sig Dispense Refill   acetaminophen (TYLENOL) 500 MG tablet Take 500 mg by mouth every 4 (four) hours as needed for moderate pain, fever, headache or mild pain.      albuterol (VENTOLIN HFA) 108 (90 Base) MCG/ACT inhaler Inhale 2 puffs into the lungs every 6 (six) hours as needed for wheezing or shortness of breath.     Calcium Carb-Cholecalciferol (CALCIUM + D3 PO) Take 1 tablet by mouth daily with breakfast.     docusate sodium (COLACE) 100 MG capsule Take 2 capsules (200 mg total) by mouth 2 (two) times daily. (Patient taking differently: Take 200 mg by mouth 2 (two) times daily as needed (when taking pain meds).) 10 capsule 0   doxycycline (VIBRA-TABS) 100 MG tablet Take 1 tablet (100 mg total) by mouth 2 (two) times daily. 14 tablet 0  ELIQUIS 2.5 MG TABS tablet TAKE 1 TABLET BY MOUTH TWICE  DAILY (Patient taking differently: Take 2.5 mg by mouth 2 (two) times daily.) 180 tablet 3   Ensifentrine 3 MG/2.5ML SUSP Inhale 3 mg into the lungs in the morning and at bedtime. (Patient not taking: Reported on 08/21/2023) 450 mL 1   Ferrous Sulfate (IRON) 325 (65 Fe) MG TABS Take 325 mg by mouth See admin instructions. Take 325 mg by mouth with food every other day     fluticasone (FLONASE) 50 MCG/ACT nasal spray USE 2 SPRAYS IN BOTH NOSTRILS  DAILY 48 g 3   folic acid (FOLVITE) 1 MG tablet Take 1 tablet (1 mg total) by mouth daily.     furosemide (LASIX) 20 MG tablet Take 1 tablet (20 mg total) by mouth daily as needed for edema (for lower leg edema. do not take for more than 3 days in a row). (Patient taking differently: Take 20 mg by mouth daily.) 90 tablet 0   ipratropium-albuterol (DUONEB) 0.5-2.5 (3) MG/3ML SOLN TAKE 3 MLS BY NEBULIZATION 3 (THREE) TIMES DAILY. 810 mL 0   Multiple Vitamin (MULTIVITAMIN WITH MINERALS) TABS tablet Take 1 tablet by mouth daily. (Patient taking differently: Take 1 tablet by mouth daily with breakfast.)     mupirocin ointment (BACTROBAN) 2 % Apply 1 Application topically 2 (two) times daily. For topical skin infections 30 g 1   OXYGEN Inhale 4.5 L/min into the lungs continuous.     pantoprazole (PROTONIX) 20 MG tablet TAKE 1 TABLET BY  MOUTH DAILY (Patient taking differently: Take 20 mg by mouth daily before breakfast.) 90 tablet 3   predniSONE (DELTASONE) 10 MG tablet Take 10 mg by mouth daily with breakfast.     rosuvastatin (CRESTOR) 40 MG tablet Take 1 tablet (40 mg total) by mouth daily. 90 tablet 3   SYMBICORT 160-4.5 MCG/ACT inhaler USE 2 INHALATIONS BY MOUTH TWICE DAILY (Patient taking differently: Inhale 2 puffs into the lungs 2 (two) times daily.) 30.6 g 3   SYSTANE PRESERVATIVE FREE 0.4-0.3 % SOLN Place 1 drop into both eyes 2 (two) times daily.     Tiotropium Bromide Monohydrate (SPIRIVA RESPIMAT) 2.5 MCG/ACT AERS USE 2 INHALATIONS BY MOUTH ONCE  DAILY 12 g 3   traMADol (ULTRAM) 50 MG tablet Take 50 mg by mouth every 12 (twelve) hours as needed for severe pain.     No current facility-administered medications for this visit.     REVIEW OF SYSTEMS: On review of systems, the patient reports he has been doing okay since his hospital discharge. He continues with home O2 and night time BIPAP. He denies headaches, nausea, vomiting, weakness, loss of control or visual changes. He does have mild neck pain that does not radiate especially with certain positions at night time. No other complaints are verbalized.      PHYSICAL EXAM:  Unable to assess due to encounter type   ECOG = 2  0 - Asymptomatic (Fully active, able to carry on all predisease activities without restriction)  1 - Symptomatic but completely ambulatory (Restricted in physically strenuous activity but ambulatory and able to carry out work of a light or sedentary nature. For example, light housework, office work)  2 - Symptomatic, <50% in bed during the day (Ambulatory and capable of all self care but unable to carry out any work activities. Up and about more than 50% of waking hours)  3 - Symptomatic, >50% in bed, but not bedbound (Capable of only limited self-care,  confined to bed or chair 50% or more of waking hours)  4 - Bedbound (Completely  disabled. Cannot carry on any self-care. Totally confined to bed or chair)  5 - Death   Santiago Glad MM, Creech RH, Tormey DC, et al. [phone]). "Toxicity and response criteria of the [facility]". Am. Evlyn Clines. Oncol. 5 (6): 649-55    LABORATORY DATA:  Lab Results  Component Value Date   WBC 11.4 (H) 08/21/2023   HGB 11.1 (L) 08/21/2023   HCT 34.5 (L) 08/21/2023   MCV 92.3 08/21/2023   PLT 221.0 08/21/2023   Lab Results  Component Value Date   NA 139 08/21/2023   K 5.1 08/21/2023   CL 92 (L) 08/21/2023   CO2 42 (H) 08/21/2023   Lab Results  Component Value Date   ALT 19 08/21/2023   AST 25 08/21/2023   ALKPHOS 59 08/21/2023   BILITOT 0.5 08/21/2023      RADIOGRAPHY: MR Brain Wo Contrast Result Date: 09/09/2023 CLINICAL DATA:  Brain metastases. Assess treatment response. Small cell lung carcinoma. EXAM: MRI HEAD WITHOUT CONTRAST TECHNIQUE: Multiplanar, multiecho pulse sequences of the brain and surrounding structures were obtained without intravenous contrast. COMPARISON:  02/03/2023.  07/25/2022. FINDINGS: Brain: Diffusion imaging does not show any acute or subacute infarction or other cause of restricted diffusion. Chronic small-vessel ischemic changes are seen affecting the pons, thalami, basal ganglia and cerebral hemispheric white matter. No cortical or large vessel territory infarction. No evidence of mass lesion. Today's study was ordered and performed without contrast. This could limit detection of tiny metastases, but there is no specific evidence suggesting that. Patient has not had metastatic disease in the past. No evidence of hydrocephalus or extra-axial collection. Vascular: Major vessels at the base of the brain show flow. Skull and upper cervical spine: There appears to be a disc herniation at C2-3 with severe spinal stenosis. There appears to be cord deformity. Patient would be at risk of compressive myelopathy. Therefore, depending upon the overall  clinical condition of the patient, MRI of the cervical spine would be suggested for further evaluation. Sinuses/Orbits: Clear/normal Other: None IMPRESSION: 1. No evidence of metastatic disease. Today's study was ordered and performed without contrast. This could limit detection of tiny metastases, but there is no specific evidence suggesting that. Patient has not had metastatic disease in the past. 2. Chronic small-vessel ischemic changes of the pons, thalami, basal ganglia and cerebral hemispheric white matter. 3. 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 Patient would be at risk of compressive myelopathy. Therefore, depending upon the overall clinical condition of the patient, MRI of the cervical spine would be suggested for further evaluation. Electronically Signed   By: Paulina Fusi M.D.   On: 09/09/2023 15:37       IMPRESSION/PLAN: 1. Limited Stage, [id number], Small cell carcinoma of the RLL.  The patient has been doing very well in surveillance  with Dr. Arbutus Ped.  We discussed the MRI results, and the rationale to continue to follow him closely. He is interested in follow up and elects to move his surveillance to 9 months after we discussed the pros and cons of this timing. He is aware though of symptoms to call for between our next visit.  2. Hypersensitivity to Gadavist. We have decided to forgo contrast given this, but radiology has indicated that if needed for treatment planning we could premedicate the patient.  3. History of osteopenia with thoracic spine compression fractures. He will  follow with [name] regarding these changes.  4. 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 The patient denies neuropathic or radicular symptoms but does have some neck pain with positioning at night. We discussed referral to neurosurgery and if they desire additional imaging prior to consult, I'm happy to order additional MRI.  5. Hypercarbic respiratory failure. The patient will  follow up with [name] and his team and is trying to get ABGs coordinated with their office. We will follow this expectantly.  This encounter was conducted via telephone.  The patient has provided two factor identification and has given verbal consent for this type of encounter and has been advised to only accept a meeting of this type in a secure network environment. The time spent during this encounter was 35 minutes including preparation, discussion, and coordination of the patient's care. The attendants for this meeting include  Ronny Bacon  and [name] and his wife Trapper Meech.   During the encounter,   Ronny Bacon was located remotely at home.  [name] was located at home with his wife Arran Fessel, [facility]   **Disclaimer: This note was dictated with voice recognition software. Similar sounding words can inadvertently be transcribed and this note may contain transcription errors which may not have been corrected upon publication of note.**

## 2023-09-16 ENCOUNTER — Encounter: Payer: Self-pay | Admitting: Radiation Oncology

## 2023-09-16 ENCOUNTER — Other Ambulatory Visit: Payer: Self-pay | Admitting: *Deleted

## 2023-09-16 ENCOUNTER — Telehealth: Payer: Self-pay

## 2023-09-16 ENCOUNTER — Ambulatory Visit
Admission: RE | Admit: 2023-09-16 | Discharge: 2023-09-16 | Disposition: A | Discharge status: Home / Self Care | Payer: Medicare Other | Source: Ambulatory Visit | Attending: Radiation Oncology | Admitting: Radiation Oncology

## 2023-09-16 DIAGNOSIS — M5020 Other cervical disc displacement, unspecified cervical region: Secondary | ICD-10-CM

## 2023-09-16 DIAGNOSIS — J9611 Chronic respiratory failure with hypoxia: Secondary | ICD-10-CM

## 2023-09-16 DIAGNOSIS — J449 Chronic obstructive pulmonary disease, unspecified: Secondary | ICD-10-CM

## 2023-09-16 DIAGNOSIS — C3431 Malignant neoplasm of lower lobe, right bronchus or lung: Secondary | ICD-10-CM

## 2023-09-16 NOTE — Addendum Note (Signed)
 Addended by: Gay Filler T on: 09/16/2023 08:53 AM   Modules accepted: Orders

## 2023-09-16 NOTE — Progress Notes (Signed)
 Telephone nursing appointment for review of most recent MRI results. I verified patient's identity x2 and began nursing interview.   Patient reports doing well. Patient denies any other related issues at this time.   Meaningful use complete.   Patient aware of their 8:30am-09/16/23 telephone appointment w/ Laurence Aly PA-C. I left my extension [phone] in case patient needs anything. Patient verbalized understanding. This concludes the nursing interview.   Patient contact [phone]     Ruel Favors, LPN

## 2023-09-16 NOTE — Telephone Encounter (Signed)
 ABG ORDER

## 2023-09-25 ENCOUNTER — Telehealth: Payer: Self-pay | Admitting: Radiation Oncology

## 2023-09-25 NOTE — Telephone Encounter (Signed)
 Sent referral to Neurosurgery Dr. Wynetta Emery 09/17/23, patient scheduled for consult 10/14/23.

## 2023-09-27 ENCOUNTER — Other Ambulatory Visit: Payer: Self-pay | Admitting: Family Medicine

## 2023-10-13 ENCOUNTER — Telehealth (INDEPENDENT_AMBULATORY_CARE_PROVIDER_SITE_OTHER): Payer: Medicare Other | Admitting: Internal Medicine

## 2023-10-13 ENCOUNTER — Telehealth: Payer: Self-pay | Admitting: Internal Medicine

## 2023-10-13 VITALS — BP 110/77 | HR 111

## 2023-10-13 DIAGNOSIS — J9611 Chronic respiratory failure with hypoxia: Secondary | ICD-10-CM

## 2023-10-13 DIAGNOSIS — J449 Chronic obstructive pulmonary disease, unspecified: Secondary | ICD-10-CM

## 2023-10-13 NOTE — Telephone Encounter (Signed)
 No there isn't a PFT ordered from me, I think the confusion may be coming from the fact that the ABG is ordered under the pulmonary function order set

## 2023-10-13 NOTE — Telephone Encounter (Signed)
 Francisco [name] -has upcoming arterial blood gas test on 10/21/2023.  I reviewed your note and noticed that you had ordered it to see how he is responding to the noninvasive positive pressure ventilation via the ventilator.  He states is actually helping him.  His main question is if there is a pulmonary function test ordered on the same day?.  I do not see such an order and I do not see the need for this but he is worried that he will have a PFT.  Please clarify if you ordered a PFT?   Thanks    SIGNATURE    Dr. Kalman Shan, M.D., F.C.C.P,  Pulmonary and Critical Care Medicine Staff Physician, [facility] Director - Interstitial Lung Disease  Program  Pulmonary Fibrosis [facility] at [facility] [place], [place], [address]   Pager: [phone], If no answer  -> Check AMION or Try [phone] Telephone (clinical office): [phone] Telephone (research): [phone]  12:00 PM 10/13/2023'

## 2023-10-13 NOTE — Patient Instructions (Addendum)
 -  CHRONIC OBSTRUCTIVE PULMONARY DISEASE (COPD) WITH CHRONIC RESPIRATORY FAILURE:    Recommendations - - Continue Ohtuvayre nebulizer morning and evening  - - Continue -5.5L oxygen to maintain O2 >88-90%  - Continue noninvasive positive pressure ventilation at night-via ventilator with seems to be working better for you - Continue arterial blood gas check 10/21/2023 -Continue hospice care - Continue other treatment for COPD etc. -You do not need PFTs on 10/21/2023 [but I have sent a message to Ames Dura to double check] )  Follow-up: 3 months video visit with [name]

## 2023-10-13 NOTE — Progress Notes (Signed)
 08/14/2023- Interim hx  Discussed the use of AI scribe software for clinical note transcription with the patient, who gave verbal consent to proceed.  Patient presents today for hospital follow-up. Pulmonary history includes stage IV COPD, lung cancer, chronic respiratory failure.  He was admitted for 5 days on January 1st, 2025 for COPD exacerbation and acute on chronic respiratory failure with hypoxia and hypercapnia.  He was initially treated for cough end of December outpatient by [name] with a course of Ciprofloxacin 750mg  BID x 12 days and prednisone taper. CXR with stable chronic changes without acute abnormality.  Patient was treated with IV steroids/antibiotics and noninvasive ventilator. Patient was discharged on prednisone taper.   He is on Symbicort, Spiriva, azithromycin three times a week (Monday, Wednesday, Friday), prednisone 10mg  daily and uses a BiPAP machine for at least four hours a day to manage CO2 levels, not specifically for sleep apnea. There was confusion regarding the equipment provided, as he was informed he received a BiPAP instead of a ventilator. His oxygen levels at home are in the nineties at rest but drop into the seventies with minimal exertion, even on 4.5 liters of oxygen. He uses a bedside commode to minimize exertion.  He has increased congestion and is not currently taking any medication for it. He coughs up yellowish-green mucus daily, using a suction device to manage secretions. A recent CT scan showed emphysema, bronchial thickening, and lung scarring. Last abx was when he was in the hospital. He has an allergy to Levaquin, noted for causing hallucinations, but he tolerated recently.   He is considering virtual visits to reduce the difficulty of traveling to appointments as well as hospice care.      Non-invasive Airview download 07/16/2023 - 08/14/2023 Usage days 29/30 days (97%) Average usage 4 hours 27 minutes Air leaks 12 L/min (95%)  AHI  0.9   OV 10/13/2023  Subjective:  Patient ID: Francisco [name], male , DOB: 10-11-1945 , age 78 y.o. , MRN: [id number] , ADDRESS: [address]  [place] [address] PCP Shelva Majestic, MD Patient Care Team: Shelva Majestic, MD as PCP - General (Family Medicine) Kalman Shan, MD as Consulting Physician (Pulmonary Disease) Arminda Resides, MD as Consulting Physician (Dermatology) Dr. Mickie Hillier, DDS (Dentistry)  This Provider for this visit: Treatment Team:  Attending Provider: Kalman Shan, MD   Type of visit: Video Virtual Visit Identification of patient Francisco [name] with Sep 23, 1945 and MRN [id number] - 2 person identifier Risks: Risks, benefits, limitations of telephone visit explained. Patient understood and verbalized agreement to proceed Anyone else on call: His wife is present on the call Patient location: His home This provider location: [address]; [place]; [place] [address]. [facility]. [phone]    10/13/2023 -   Chief Complaint  Patient presents with   Follow-up    Breathing is doing about the same. He has wheezing and cough with brown sputum.    COPD Hoispice care  HPI Francisco [name] 78 y.o. - using NIPVV via Vent. Says the Vent machine is beter than std biPAP. On Ohtuvayre - and is helping . OVerall strength is bettter.  SAys is baseline with 1 cup of sputum per day - brownish. Off cipro. On 10mg  daily prednisone. On BD. On 5.5L O2 24/7 . Flxied rate.  Says on 10/21/23 - ABG prderd and pn schedule at [facility].  He is really worried about getting PFTs  on that day.  But otherwise he is stable.    HAd MRI brain -> going to see neuro due to cervical disease. Neurosurgery Dr. Wynetta Emery patient scheduled for consult 10/14/23.  - Herniated disc   PFT      No data to display             LAB RESULTS last 96 hours No results found.   IMPRESSION: 1. No evidence of metastatic disease. Today's study  was ordered and performed without contrast. This could limit detection of tiny metastases, but there is no specific evidence suggesting that. Patient has not had metastatic disease in the past. 2. Chronic small-vessel ischemic changes of the pons, thalami, basal ganglia and cerebral hemispheric white matter. 3. 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 Patient would be at risk of compressive myelopathy. Therefore, depending upon the overall clinical condition of the patient, MRI of the cervical spine would be suggested for further evaluation.     Electronically Signed   By: Paulina Fusi M.D.   On: 09/09/2023 15:37    Latest Reference Range & Units 12/05/17 10:20 04/15/19 14:06 01/24/20 15:13 01/26/20 12:29 07/11/23 16:05 07/12/23 06:18 07/13/23 09:46 07/13/23 11:06  pH, Arterial 7.35 - 7.45  7.420 7.423 7.454 (H) 7.446 7.42 7.4 7.34 (L) 7.34 (L)  pCO2 arterial 32 - 48 mmHg 42.6 41.0 50.6 (H) 43.0 74 (HH) 68 (HH) 83 (HH) 83 (HH)  pO2, Arterial 83 - 108 mmHg 92.0 108 141 (H) 79.1 (L) 122 (H) 80 (L) 148 (H) 111 (H)  (HH): Data is critically high (H): Data is abnormally high (L): Data is abnormally low    has a past medical history of Acute GI bleeding (02/18/2020), Allergy, Blood transfusion without reported diagnosis, Chronic back pain, Chronic rhinitis, Clotting disorder (HCC) (2022@2024 ), Compressed spine fracture (HCC), COPD (chronic obstructive pulmonary disease) (HCC), Dyspnea, Emphysema lung (HCC), Emphysema of lung (HCC), GERD (gastroesophageal reflux disease), Hyperglycemia (06/12/2016), Hypertension (2/24), Iron deficiency anemia due to chronic blood loss (08/23/2020), Left ankle swelling (07/22/2014), Leukocytosis (12/19/2016), Melena (08/11/2022), On home oxygen therapy, Onychomycosis, Orthostatic hypotension, Osteoporosis, Oxygen deficiency, PAD (peripheral artery disease) (HCC), Pneumonia, Pulmonary embolism (HCC) (04/07/2018), Skin cancer,  Small cell lung cancer, right (HCC) (2023), and Vertigo.   reports that he quit smoking about 6 years ago. His smoking use included cigarettes and pipe. He started smoking about 58 years ago. He has a 104 pack-year smoking history. He has never been exposed to tobacco smoke. He has never used smokeless tobacco.  Past Surgical History:  Procedure Laterality Date   ABDOMINAL AORTOGRAM W/LOWER EXTREMITY N/A 01/21/2020   Procedure: ABDOMINAL AORTOGRAM W/LOWER EXTREMITY;  Surgeon: Sherren Kerns, MD;  [facility]: [facility];  Service: Cardiovascular;  Laterality: N/A;   APPENDECTOMY     BIOPSY  02/20/2020   Procedure: BIOPSY;  Surgeon: Meridee Score Netty Starring., MD;  Location: [facility] ENDOSCOPY;  Service: Gastroenterology;;   BIOPSY  08/12/2022   Procedure: BIOPSY;  Surgeon: Shellia Cleverly, DO;  Location: [facility] ENDOSCOPY;  Service: Gastroenterology;;   BRONCHIAL BIOPSY  09/04/2021   Procedure: BRONCHIAL BIOPSIES;  Surgeon: Josephine Igo, DO;  Location: [facility] ENDOSCOPY;  Service: Pulmonary;;   BRONCHIAL BRUSHINGS  09/04/2021   Procedure: BRONCHIAL BRUSHINGS;  Surgeon: Josephine Igo, DO;  Location: [facility];  Service: Pulmonary;;   BRONCHIAL NEEDLE ASPIRATION BIOPSY  09/04/2021   Procedure: BRONCHIAL NEEDLE ASPIRATION BIOPSIES;  Surgeon: Josephine Igo, DO;  Location: [facility]OS[facility];  Service: Pulmonary;;  CATARACT EXTRACTION, BILATERAL     CHOLECYSTECTOMY     ENDARTERECTOMY FEMORAL Right 04/19/2019   Procedure: ENDARTERECTOMY RIGHT COMMON FEMORAL;  Surgeon: Sherren Kerns, MD;  Location: [facility] OR;  Service: Vascular;  Laterality: Right;   ENDARTERECTOMY FEMORAL Left 01/24/2020   Procedure: LEFT FEMORAL ENDARTERECTOMY WITH DACRON PATCH ANGIOPLASTY;  Surgeon: Sherren Kerns, MD;  Location: [facility] OR;  Service: Vascular;  Laterality: Left;   ESOPHAGOGASTRODUODENOSCOPY (EGD) WITH PROPOFOL N/A 02/20/2020   Procedure: ESOPHAGOGASTRODUODENOSCOPY (EGD) WITH PROPOFOL;  Surgeon: Lemar Lofty., MD;  Location: [facility] ENDOSCOPY;  Service: Gastroenterology;  Laterality: N/A;   ESOPHAGOGASTRODUODENOSCOPY (EGD) WITH PROPOFOL N/A 08/12/2022   Procedure: ESOPHAGOGASTRODUODENOSCOPY (EGD) WITH PROPOFOL;  Surgeon: Shellia Cleverly, DO;  [facility];  Service: Gastroenterology;  Laterality: N/A;   EYE SURGERY  2019   Cataract   FEMORAL ENDARTERECTOMY Left 01/24/2020   FIDUCIAL MARKER PLACEMENT  09/04/2021   Procedure: FIDUCIAL MARKER PLACEMENT;  Surgeon: Josephine Igo, DO;  Location: [facility];  Service: Pulmonary;;   FRACTURE SURGERY  2019   HEMOSTASIS CLIP PLACEMENT  02/20/2020   Procedure: HEMOSTASIS CLIP PLACEMENT;  Surgeon: Lemar Lofty., MD;  Location: [facility] ENDOSCOPY;  Service: Gastroenterology;;   HEMOSTASIS CLIP PLACEMENT  08/12/2022   Procedure: HEMOSTASIS CLIP PLACEMENT;  Surgeon: Shellia Cleverly, DO;  [facility];  Service: Gastroenterology;;   HOT HEMOSTASIS N/A 02/20/2020   Procedure: HOT HEMOSTASIS (ARGON PLASMA COAGULATION/BICAP);  Surgeon: Lemar Lofty., MD;  Location: [facility] ENDOSCOPY;  Service: Gastroenterology;  Laterality: N/A;   HOT HEMOSTASIS N/A 08/12/2022   Procedure: HOT HEMOSTASIS (ARGON PLASMA COAGULATION/BICAP);  Surgeon: Shellia Cleverly, DO;  Location: [facility] ENDOSCOPY;  Service: Gastroenterology;  Laterality: N/A;   INSERTION OF ILIAC STENT Left 01/24/2020   Procedure: INSERTION OF VBX STENT 8X59 AND 8X39 LEFT COMMON ILIAC ARTERY. INSERTION OF INNOVA 7 X 60 INNOVA STENT LEFT EXTERNAL ILIAC ARTERY. ;  Surgeon: Sherren Kerns, MD;  Location: [facility] OR;  Service: Vascular;  Laterality: Left;   LOWER EXTREMITY ANGIOGRAPHY  12/11/2018   LOWER EXTREMITY ANGIOGRAPHY N/A 12/11/2018   Procedure: LOWER EXTREMITY ANGIOGRAPHY;  Surgeon: Sherren Kerns, MD;  [facility];  Service: Cardiovascular;  Laterality: N/A;   PATCH ANGIOPLASTY Right 04/19/2019   Procedure: Patch Angioplasty;  Surgeon: Sherren Kerns, MD;  Location: [facility] OR;  Service: Vascular;  Laterality: Right;   PERIPHERAL VASCULAR INTERVENTION Right 12/11/2018   Procedure: PERIPHERAL VASCULAR INTERVENTION;  Surgeon: Sherren Kerns, MD;  [facility];  Service: Cardiovascular;  Laterality: Right;  Common Iliac    SKIN CANCER EXCISION     "lips, face, ears, arms" (04/07/2018)   SUBMUCOSAL INJECTION  08/12/2022   Procedure: SUBMUCOSAL INJECTION;  Surgeon: Shellia Cleverly, DO;  Location: [facility] ENDOS[facility];  Service: Gastroenterology;;   TONSILLECTOMY     ULTRASOUND GUIDANCE FOR VASCULAR ACCESS Right 01/24/2020   Procedure: ULTRASOUND GUIDANCE FOR VASCULAR ACCESS;  Surgeon: Sherren Kerns, MD;  Location: [facility] OR;  Service: Vascular;  Laterality: Right;   VIDEO BRONCHOSCOPY WITH RADIAL ENDOBRONCHIAL ULTRASOUND  09/04/2021   Procedure: VIDEO BRONCHOSCOPY WITH RADIAL ENDOBRONCHIAL ULTRASOUND;  Surgeon: Josephine Igo, DO;  Location: [facility] ENDOSCOPY;  Service: Pulmonary;;    Allergies  Allergen Reactions   Tape Other (See Comments)    Skin tears easily   Augmentin [Amoxicillin-Pot Clavulanate] Other (See Comments)    Thrush Sore throat Hoarse voice   Ciprofloxacin Other (See Comments)    hallucinations  Gadavist [Gadobutrol] Other (See Comments)    Unknown reaction   Gadolinium Itching, Rash and Other (See Comments)    Itchy back rash   Lipitor [Atorvastatin] Rash    Immunization History  Administered Date(s) Administered   Fluad Quad(high Dose 65+) 03/16/2019, 03/10/2020   Fluad Trivalent(High Dose 65+) 03/14/2023   Influenza Split 04/24/2012   Influenza Whole 05/08/2009, 04/08/2011   Influenza, High Dose Seasonal PF 05/01/2017, 03/31/2018, 04/17/2021, 04/03/2022   Influenza,inj,Quad PF,6+ Mos 04/27/2013, 04/15/2014, 04/24/2015   Influenza,inj,quad, With Preservative 05/01/2018   Influenza-Unspecified 05/13/2016   PFIZER Comirnaty(Gray Top)Covid-19 Tri-Sucrose Vaccine 11/24/2020, 04/03/2022,  03/17/2023   PFIZER(Purple Top)SARS-COV-2 Vaccination 07/30/2019, 08/20/2019, 04/07/2020   Pfizer Covid-19 Vaccine Bivalent Booster 76yrs & up 03/16/2021   Pneumococcal Conjugate-13 02/25/2014   Pneumococcal Polysaccharide-23 08/15/2011, 12/19/2016   Pneumococcal-Unspecified 03/08/2017   Respiratory Syncytial Virus Vaccine,Recomb Aduvanted(Arexvy) 06/06/2022   Td 02/21/2010   Tdap 11/05/2017   Zoster, Live 01/29/2012    Family History  Problem Relation Age of Onset   Heart disease Mother        CABG in her 28s, nonsmoker   Cancer Mother    Stroke Father    Heart disease Father        Died of MI at age 22, smoker   Hepatitis Sister    Coronary artery disease Other        male 1st degree relative <60   Colon cancer Neg Hx    Pancreatic cancer Neg Hx    Esophageal cancer Neg Hx    Inflammatory bowel disease Neg Hx    Liver disease Neg Hx    Rectal cancer Neg Hx    Stomach cancer Neg Hx      Current Outpatient Medications:    acetaminophen (TYLENOL) 500 MG tablet, Take 500 mg by mouth every 4 (four) hours as needed for moderate pain, fever, headache or mild pain., Disp: , Rfl:    albuterol (VENTOLIN HFA) 108 (90 Base) MCG/ACT inhaler, Inhale 2 puffs into the lungs every 6 (six) hours as needed for wheezing or shortness of breath., Disp: , Rfl:    Calcium Carb-Cholecalciferol (CALCIUM + D3 PO), Take 1 tablet by mouth daily with breakfast., Disp: , Rfl:    docusate sodium (COLACE) 100 MG capsule, Take 2 capsules (200 mg total) by mouth 2 (two) times daily. (Patient taking differently: Take 200 mg by mouth 2 (two) times daily as needed (when taking pain meds).), Disp: 10 capsule, Rfl: 0   ELIQUIS 2.5 MG TABS tablet, TAKE 1 TABLET BY MOUTH TWICE  DAILY (Patient taking differently: Take 2.5 mg by mouth 2 (two) times daily.), Disp: 180 tablet, Rfl: 3   Ensifentrine 3 MG/2.5ML SUSP, Inhale 3 mg into the lungs in the morning and at bedtime., Disp: 450 mL, Rfl: 1   Ferrous Sulfate (IRON)  325 (65 Fe) MG TABS, Take 325 mg by mouth See admin instructions. Take 325 mg by mouth with food every other day, Disp: , Rfl:    fluticasone (FLONASE) 50 MCG/ACT nasal spray, USE 2 SPRAYS IN BOTH NOSTRILS  DAILY, Disp: 48 g, Rfl: 3   folic acid (FOLVITE) 1 MG tablet, Take 1 tablet (1 mg total) by mouth daily., Disp: , Rfl:    ipratropium-albuterol (DUONEB) 0.5-2.5 (3) MG/3ML SOLN, TAKE 3 MLS BY NEBULIZATION 3 (THREE) TIMES DAILY., Disp: 810 mL, Rfl: 0   Multiple Vitamin (MULTIVITAMIN WITH MINERALS) TABS tablet, Take 1 tablet by mouth daily. (Patient taking differently: Take 1 tablet by mouth daily with  breakfast.), Disp: , Rfl:    mupirocin ointment (BACTROBAN) 2 %, Apply 1 Application topically 2 (two) times daily. For topical skin infections, Disp: 30 g, Rfl: 1   OXYGEN, Inhale 4.5 L/min into the lungs continuous., Disp: , Rfl:    pantoprazole (PROTONIX) 20 MG tablet, TAKE 1 TABLET BY MOUTH DAILY (Patient taking differently: Take 20 mg by mouth daily before breakfast.), Disp: 90 tablet, Rfl: 3   predniSONE (DELTASONE) 10 MG tablet, Take 10 mg by mouth daily with breakfast., Disp: , Rfl:    rosuvastatin (CRESTOR) 40 MG tablet, Take 1 tablet (40 mg total) by mouth daily., Disp: 90 tablet, Rfl: 3   SYMBICORT 160-4.5 MCG/ACT inhaler, INHALE 2 INHALATIONS BY MOUTH  TWICE DAILY, Disp: 30.6 g, Rfl: 3   SYSTANE PRESERVATIVE FREE 0.4-0.3 % SOLN, Place 1 drop into both eyes 2 (two) times daily., Disp: , Rfl:    Tiotropium Bromide Monohydrate (SPIRIVA RESPIMAT) 2.5 MCG/ACT AERS, USE 2 INHALATIONS BY MOUTH ONCE  DAILY, Disp: 12 g, Rfl: 3   traMADol (ULTRAM) 50 MG tablet, Take 50 mg by mouth every 12 (twelve) hours as needed for severe pain., Disp: , Rfl:    furosemide (LASIX) 20 MG tablet, Take 1 tablet (20 mg total) by mouth daily as needed for edema (for lower leg edema. do not take for more than 3 days in a row). (Patient taking differently: Take 20 mg by mouth daily.), Disp: 90 tablet, Rfl: 0       Objective:   Vitals:   10/13/23 1135  BP: 110/77  Pulse: (!) 111  SpO2: 97%    Estimated body mass index is 20.82 kg/m as calculated from the following:   Height as of 08/21/23: 6' (1.829 m).   Weight as of 08/21/23: 153 lb 8 oz (69.6 kg).  @WEIGHTCHANGE @  There were no vitals filed for this visit.   Physical Exam   General: No distress. Looks better than anticioaed O2 at rest: YES Cane present: no Sitting in wheel chair: no Frail: yes Obese: no bubu cushingoint Neuro: Alert and Oriented x 3. GCS 15. Speech normal Psych: Pleasant REsp - full sentences     Assessment:       ICD-10-CM   1. Chronic respiratory failure with hypoxia (HCC)  J96.11     2. Stage 4 very severe COPD by GOLD classification (HCC)  J44.9          Plan:     Patient Instructions    -CHRONIC OBSTRUCTIVE PULMONARY DISEASE (COPD) WITH CHRONIC RESPIRATORY FAILURE:    Recommendations - - Continue Ohtuvayre nebulizer morning and evening  - - Continue -5.5L oxygen to maintain O2 >88-90%  - Continue noninvasive positive pressure ventilation at night-via ventilator with seems to be working better for you - Continue arterial blood gas check 10/21/2023 -Continue hospice care - Continue other treatment for COPD etc. -You do not need PFTs on 10/21/2023 [but I have sent a message to Ames Dura to double check] )  Follow-up: 3 months video visit with Dr. Sherrin Daisy Return in about 3 months (around 01/12/2024) for 15 min visit, VIDEO VISIT, with Dr Marchelle Gearing.    SIGNATURE    Dr. Kalman Shan, M.D., F.C.C.P,  Pulmonary and Critical Care Medicine Staff Physician, [facility] Center Director - Interstitial Lung Disease  Program  Pulmonary Fibrosis [facility] at [facility] [place], [place], [address]  Pager: [phone], If no answer or between  15:00h - 7:00h: call  [phone]  [id number] Telephone: [phone]  12:02 PM 10/13/2023

## 2023-10-15 NOTE — Telephone Encounter (Signed)
 Pls tell pateint only ABG ordred

## 2023-10-16 NOTE — Telephone Encounter (Signed)
 Spoke with the pt and his spouse and notified that the appt is not for PFT, it's for ABG only. They would like this to be confirmed bc someone from the hospital called them and said he would be doing the PFT and ABG. PCC's- can you please call and get this cleared up?

## 2023-10-20 NOTE — Telephone Encounter (Signed)
 RT spoke with pt about ABG. PT has been scheduled on 10/20/23 and is aware of what the appointment is. NFN.

## 2023-10-21 ENCOUNTER — Ambulatory Visit (HOSPITAL_COMMUNITY)
Admission: RE | Admit: 2023-10-21 | Discharge: 2023-10-21 | Disposition: A | Discharge status: Home / Self Care | Source: Ambulatory Visit | Attending: Primary Care | Admitting: Primary Care

## 2023-10-21 ENCOUNTER — Other Ambulatory Visit (HOSPITAL_COMMUNITY): Payer: Self-pay | Admitting: Respiratory Therapy

## 2023-10-21 DIAGNOSIS — J9611 Chronic respiratory failure with hypoxia: Secondary | ICD-10-CM | POA: Diagnosis present

## 2023-10-21 DIAGNOSIS — J449 Chronic obstructive pulmonary disease, unspecified: Secondary | ICD-10-CM | POA: Diagnosis present

## 2023-10-21 LAB — BLOOD GAS, ARTERIAL
Acid-Base Excess: 9.2 mmol/L — ABNORMAL HIGH (ref 0.0–2.0)
Bicarbonate: 35.5 mmol/L — ABNORMAL HIGH (ref 20.0–28.0)
Drawn by: 24910
O2 Saturation: 99.3 %
Patient temperature: 37
pCO2 arterial: 56 mmHg — ABNORMAL HIGH (ref 32–48)
pH, Arterial: 7.41 (ref 7.35–7.45)
pO2, Arterial: 121 mmHg — ABNORMAL HIGH (ref 83–108)

## 2023-10-22 ENCOUNTER — Telehealth: Payer: Self-pay | Admitting: *Deleted

## 2023-10-22 NOTE — Telephone Encounter (Signed)
 Copied from CRM [id number]. Topic: Clinical - Lab/Test Results >> Oct 22, 2023  8:33 AM Margarette Shawl wrote: Reason for CRM:   Patient is calling in reference to labwork performed on 04/14. Reviewed chart; results have returned but no message from provider concerning them in chart.   Patient requests call back to review results.  CB#   [phone]  MR- please advise on ABG results, thanks

## 2023-10-23 NOTE — Telephone Encounter (Signed)
  ABG shows improvement in carbon dioxide but it is still high.  I think 56 this is baseline pCO2 [normal is 40].  He continues to need to use CPAP/BiPAP/noninvasive positive pressure ventilation at night   Latest Reference Range & Units 12/05/17 10:20 04/15/19 14:06 01/24/20 15:13 01/26/20 12:29 07/11/23 16:05 07/12/23 06:18 07/13/23 09:46 07/13/23 11:06 10/21/23 10:05  pH, Arterial 7.35 - 7.45  7.420 7.423 7.454 (H) 7.446 7.42 7.4 7.34 (L) 7.34 (L) 7.41  pCO2 arterial 32 - 48 mmHg 42.6 41.0 50.6 (H) 43.0 74 (HH) 68 (HH) 83 (HH) 83 (HH) 56 (H)  pO2, Arterial 83 - 108 mmHg 92.0 108 141 (H) 79.1 (L) 122 (H) 80 (L) 148 (H) 111 (H) 121 (H)  (HH): Data is critically high (H): Data is abnormally high (L): Data is abnormally low

## 2023-10-23 NOTE — Telephone Encounter (Signed)
I called and spoke with the pt and notified of results/recs per MR  He verbalized understanding  Nothing further needed

## 2023-10-31 ENCOUNTER — Encounter: Payer: Self-pay | Admitting: Family Medicine

## 2023-10-31 ENCOUNTER — Other Ambulatory Visit: Payer: Self-pay

## 2023-10-31 MED ORDER — MUPIROCIN 2 % EX OINT: 1.0000 | TOPICAL_OINTMENT | Freq: Two times a day (BID) | CUTANEOUS | 1 refills | Status: DC

## 2023-11-02 ENCOUNTER — Other Ambulatory Visit: Payer: Self-pay | Admitting: Internal Medicine

## 2023-11-08 ENCOUNTER — Other Ambulatory Visit: Payer: Self-pay | Admitting: Family Medicine

## 2023-11-08 ENCOUNTER — Other Ambulatory Visit: Payer: Self-pay | Admitting: Internal Medicine

## 2023-11-10 ENCOUNTER — Other Ambulatory Visit: Payer: Self-pay | Admitting: Family Medicine

## 2023-12-25 ENCOUNTER — Other Ambulatory Visit: Payer: Self-pay | Admitting: Vascular Surgery

## 2023-12-25 DIAGNOSIS — I739 Peripheral vascular disease, unspecified: Secondary | ICD-10-CM

## 2024-01-07 ENCOUNTER — Ambulatory Visit (HOSPITAL_COMMUNITY)

## 2024-01-07 ENCOUNTER — Encounter (HOSPITAL_COMMUNITY): Payer: Self-pay

## 2024-01-08 ENCOUNTER — Encounter: Payer: Self-pay | Admitting: Family Medicine

## 2024-01-12 NOTE — Telephone Encounter (Signed)
 See pics below

## 2024-01-12 NOTE — Telephone Encounter (Signed)
 Spoke with pt's spouse. She will call us  back to let us  know if they want to schedule an appt. Pt has appointments all week with other providers

## 2024-01-13 ENCOUNTER — Ambulatory Visit (HOSPITAL_COMMUNITY)
Admission: RE | Admit: 2024-01-13 | Discharge: 2024-01-13 | Disposition: A | Discharge status: Home / Self Care | Source: Ambulatory Visit | Attending: Internal Medicine | Admitting: Internal Medicine

## 2024-01-13 ENCOUNTER — Encounter: Payer: Self-pay | Admitting: Physician Assistant

## 2024-01-13 ENCOUNTER — Inpatient Hospital Stay: Attending: Radiation Oncology

## 2024-01-13 DIAGNOSIS — C3490 Malignant neoplasm of unspecified part of unspecified bronchus or lung: Secondary | ICD-10-CM | POA: Insufficient documentation

## 2024-01-13 DIAGNOSIS — C3431 Malignant neoplasm of lower lobe, right bronchus or lung: Secondary | ICD-10-CM | POA: Diagnosis present

## 2024-01-13 LAB — CMP (CANCER CENTER ONLY)
ALT: 15 U/L (ref 0–44)
AST: 25 U/L (ref 15–41)
Albumin: 3.7 g/dL (ref 3.5–5.0)
Alkaline Phosphatase: 72 U/L (ref 38–126)
Anion gap: 7 (ref 5–15)
BUN: 16 mg/dL (ref 8–23)
CO2: 43 mmol/L — ABNORMAL HIGH (ref 22–32)
Calcium: 9.6 mg/dL (ref 8.9–10.3)
Chloride: 90 mmol/L — ABNORMAL LOW (ref 98–111)
Creatinine: 0.97 mg/dL (ref 0.61–1.24)
GFR, Estimated: >60 mL/min (ref >60)
Glucose, Bld: 111 mg/dL — ABNORMAL HIGH (ref 70–99)
Potassium: 4.7 mmol/L (ref 3.5–5.1)
Sodium: 140 mmol/L (ref 135–145)
Total Bilirubin: 0.8 mg/dL (ref 0.0–1.2)
Total Protein: 6.6 g/dL (ref 6.5–8.1)

## 2024-01-13 LAB — CBC WITH DIFFERENTIAL (CANCER CENTER ONLY)
Abs Immature Granulocytes: 0.24 K/uL — ABNORMAL HIGH (ref 0.00–0.07)
Basophils Absolute: 0.1 K/uL (ref 0.0–0.1)
Basophils Relative: 0 %
Eosinophils Absolute: 0 K/uL (ref 0.0–0.5)
Eosinophils Relative: 0 %
HCT: 28.6 % — ABNORMAL LOW (ref 39.0–52.0)
Hemoglobin: 8.7 g/dL — ABNORMAL LOW (ref 13.0–17.0)
Immature Granulocytes: 2 %
Lymphocytes Relative: 4 %
Lymphs Abs: 0.4 K/uL — ABNORMAL LOW (ref 0.7–4.0)
MCH: 30.4 pg (ref 26.0–34.0)
MCHC: 30.4 g/dL (ref 30.0–36.0)
MCV: 100 fL (ref 80.0–100.0)
Monocytes Absolute: 0.5 K/uL (ref 0.1–1.0)
Monocytes Relative: 4 %
Neutro Abs: 10.4 K/uL — ABNORMAL HIGH (ref 1.7–7.7)
Neutrophils Relative %: 90 %
Platelet Count: 236 K/uL (ref 150–400)
RBC: 2.86 MIL/uL — ABNORMAL LOW (ref 4.22–5.81)
RDW: 15.3 % (ref 11.5–15.5)
WBC Count: 11.6 K/uL — ABNORMAL HIGH (ref 4.0–10.5)
nRBC: 0 % (ref 0.0–0.2)

## 2024-01-13 MED ORDER — IOHEXOL 300 MG/ML  SOLN
75.0000 mL | Freq: Once | INTRAMUSCULAR | Status: AC | PRN
  Administered 2024-01-13: 75 mL via INTRAVENOUS

## 2024-01-14 ENCOUNTER — Encounter: Payer: Self-pay | Admitting: Internal Medicine

## 2024-01-14 ENCOUNTER — Telehealth: Admitting: Internal Medicine

## 2024-01-14 VITALS — BP 95/55 | HR 113

## 2024-01-14 DIAGNOSIS — J9611 Chronic respiratory failure with hypoxia: Secondary | ICD-10-CM | POA: Diagnosis not present

## 2024-01-14 DIAGNOSIS — Z636 Dependent relative needing care at home: Secondary | ICD-10-CM

## 2024-01-14 DIAGNOSIS — Z7189 Other specified counseling: Secondary | ICD-10-CM

## 2024-01-14 DIAGNOSIS — J9612 Chronic respiratory failure with hypercapnia: Secondary | ICD-10-CM | POA: Diagnosis not present

## 2024-01-14 NOTE — Progress Notes (Signed)
 08/14/2023- Interim hx  Discussed the use of AI scribe software for clinical note transcription with the patient, who gave verbal consent to proceed.  Patient presents today for hospital follow-up. Pulmonary history includes stage IV COPD, lung cancer, chronic respiratory failure.  He was admitted for 5 days on January 1st, 2025 for COPD exacerbation and acute on chronic respiratory failure with hypoxia and hypercapnia.  He was initially treated for cough end of December outpatient by Dr. Geronimo with a course of Ciprofloxacin  750mg  BID x 12 days and prednisone  taper. CXR with stable chronic changes without acute abnormality.  Patient was treated with IV steroids/antibiotics and noninvasive ventilator. Patient was discharged on prednisone  taper.   He is on Symbicort , Spiriva , azithromycin  three times a week (Monday, Wednesday, Friday), prednisone  10mg  daily and uses a BiPAP machine for at least four hours a day to manage CO2 levels, not specifically for sleep apnea. There was confusion regarding the equipment provided, as he was informed he received a BiPAP instead of a ventilator. His oxygen levels at home are in the nineties at rest but drop into the seventies with minimal exertion, even on 4.5 liters of oxygen. He uses a bedside commode to minimize exertion.  He has increased congestion and is not currently taking any medication for it. He coughs up yellowish-green mucus daily, using a suction device to manage secretions. A recent CT scan showed emphysema, bronchial thickening, and lung scarring. Last abx was when he was in the hospital. He has an allergy  to Levaquin , noted for causing hallucinations, but he tolerated recently.   He is considering virtual visits to reduce the difficulty of traveling to appointments as well as hospice care.      Non-invasive Airview download 07/16/2023 - 08/14/2023 Usage days 29/30 days (97%) Average usage 4 hours 27 minutes Air leaks 12 L/min (95%)  AHI  0.9   OV 10/13/2023  Subjective:  Patient ID: Francisco [name], male , DOB: 12/29/1945 , age 78 y.o. , MRN: [id number] , ADDRESS: [address]  [place] [address] PCP [name], MD Patient Care Team: [name], MD as PCP - General (Family Medicine) Geronimo Amel, MD as Consulting Physician (Pulmonary Disease) Joshua Sieving, MD as Consulting Physician (Dermatology) Dr. Alm Archer, DDS (Dentistry)  This Provider for this visit: Treatment Team:  Attending Provider: Geronimo Amel, MD   Type of visit: Video Virtual Visit Identification of patient Francisco [name] with 10-15-1945 and MRN [id number] - 2 person identifier Risks: Risks, benefits, limitations of telephone visit explained. Patient understood and verbalized agreement to proceed Anyone else on call: His wife is present on the call Patient location: His home This provider location: [address], [address]; [place]; [place] [address]. [facility]. [phone]    10/13/2023 -   Chief Complaint  Patient presents with   Follow-up    Breathing is doing about the same. He has wheezing and cough with brown sputum.    COPD Hoispice care  HPI Francisco [name] 78 y.o. - using NIPVV via Vent. Says the Vent machine is beter than std biPAP. On Ohtuvayre  - and is helping . OVerall strength is bettter.  SAys is baseline with 1 cup of sputum per day - brownish. Off cipro . On 10mg  daily prednisone . On BD. On 5.5L O2 24/7 . Flxied rate.  Says on 10/21/23 - ABG prderd and pn schedule at [facility]  He is really worried about getting  PFTs on that day.  But otherwise he is stable.    HAd MRI brain -> going to see neuro due to cervical disease. Neurosurgery Dr. Onetha patient scheduled for consult 10/14/23.  - Herniated disc   PFT      No data to display             LAB RESULTS last 96 hours No results found.   IMPRESSION: 1. No evidence of metastatic disease. Today's study  was ordered and performed without contrast. This could limit detection of tiny metastases, but there is no specific evidence suggesting that. Patient has not had metastatic disease in the past. 2. Chronic small-vessel ischemic changes of the pons, thalami, basal ganglia and cerebral hemispheric white matter. 3. 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 Patient would be at risk of compressive myelopathy. Therefore, depending upon the overall clinical condition of the patient, MRI of the cervical spine would be suggested for further evaluation.     Electronically Signed   By: Oneil Officer M.D.   On: 09/09/2023 15:37     OV 01/14/2024  Subjective:  Patient ID: Francisco [name], male , DOB: [birth year] , age 78 y.o. , MRN: [id number] , ADDRESS: [address] [place] [address] PCP [name], MD Patient Care Team: [name], MD as PCP - General (Family Medicine) Geronimo Amel, MD as Consulting Physician (Pulmonary Disease) Joshua Sieving, MD as Consulting Physician (Dermatology) Dr. Alm Archer, DDS (Dentistry)  This Provider for this visit: Treatment Team:  Attending Provider: Geronimo Amel, MD    01/14/2024 -   Chief Complaint  Patient presents with   Follow-up    Breathing has been terrible- worse over the past 2 wks. He c/o wheezing and cough with brown to green. He denies any fevers but has had some back aches.     Type of visit: Video Virtual Visit Identification of patient Francisco [name] with 1945-11-05 and MRN [id number] - 2 person identifier Risks: Risks, benefits, limitations of telephone visit explained. Patient understood and verbalized agreement to proceed Anyone else on call: his wife Patient location:  his wife This provider location: [address]; [place]; [place] [address]. [facility]. [phone]  HPI Francisco LELON Ponciano [name] 78 y.o. -   - :  HAs new bruising  left side buttock and down left leg and to left knee. No falls. No bleeding. BUT on eliquis    - incrased sleep x 1 week  - blood work yesterday  - co2 43 but abg in April 2025: improved  - worsened anemia - now hgb 8s (proior 10) (on irone pill every other day)  - CT chest - new T3 compression fracture   - wife feels he is very weak  - Hospice  - currently he is NOT in hospice  0 wufe reports worsneing care giver burden - reports recently his bp was low and she strugged with it and was exhasitng  - he is reluctant on hospice because he is worried he will lose me and PCP [name], MD\ as doctor PFT      No data to display           Latest Reference Range & Units 12/05/17 10:20 04/15/19 14:06 01/24/20 15:13 01/26/20 12:29 07/11/23 16:05 07/12/23 06:18 07/13/23 09:46 07/13/23 11:06 10/21/23 10:05  pH, Arterial 7.35 - 7.45  7.420 7.423 7.454 (H) 7.446 7.42 7.4 7.34 (L)  7.34 (L) 7.41  pCO2 arterial 32 - 48 mmHg 42.6 41.0 50.6 (H) 43.0 74 (HH) 68 (HH) 83 (HH) 83 (HH) 56 (H)  pO2, Arterial 83 - 108 mmHg 92.0 108 141 (H) 79.1 (L) 122 (H) 80 (L) 148 (H) 111 (H) 121 (H)  (HH): Data is critically high (H): Data is abnormally high (L): Data is abnormally low   LAB RESULTS last 96 hours CT Chest W Contrast Result Date: 01/13/2024 EXAM: CT CHEST WITH CONTRAST 01/13/2024 11:47:43 AM TECHNIQUE: CT of the chest was performed with the administration of intravenous contrast. Multiplanar reformatted images are provided for review. Automated exposure control, iterative reconstruction, and/or weight based adjustment of the mA/kV was utilized to reduce the radiation dose to as low as reasonably achievable. COMPARISON: 07/12/2023 CLINICAL HISTORY: Small cell lung cancer (SCLC), staging. Limited stage (T1a, N0, M0) small cell lung cancer presented with 0.9 cm superior segment right lower lobe pulmonary nodule diagnosed in February 2023. Prior therapy: SBRT to the right lower lobe lung nodule  under the care of Dr. Dewey. Current therapy: Observation. FINDINGS: MEDIASTINUM: Heart and pericardium are unremarkable. The central airways are clear. LYMPH NODES: No mediastinal, hilar or axillary lymphadenopathy. LUNGS AND PLEURA: Moderate centrilobular and paraseptal emphysematous changes, upper lung predominant. Right apical and left upper lobe scarring. Radiation changes in the right perihilar region. Associated patchy right lower lobe opacity (image 83), favoring radiation changes over atelectasis, with stable small right pleural effusion. Prior left pleural effusion has resolved. SOFT TISSUES/BONES: New mild T3 compression fracture deformity with sclerosis (sagittal image 79). Additional moderate-to-severe T6 through T9 and L2 compression fracture deformities, unchanged. Mild T11 and T12 compression fracture deformities, unchanged. UPPER ABDOMEN: Limited images of the upper abdomen demonstrates no acute abnormality. Atherosclerotic calcifications of the abdominal aorta. IMPRESSION: 1. Radiation changes in the right perihilar region. 2. No evidence of recurrent or metastatic disease. 3. Stable small right pleural effusion. Prior left pleural effusion has resolved. 4. New mild T3 compression fracture deformity with sclerosis. Electronically signed by: Pinkie Pebbles MD 01/13/2024 08:40 PM EDT RP Workstation: [id number]        Latest Reference Range & Units 01/13/24 10:03  Sodium 135 - 145 mmol/L 140  Potassium 3.5 - 5.1 mmol/L 4.7  Chloride 98 - 111 mmol/L 90 (L)  CO2 22 - 32 mmol/L 43 (H)  Glucose 70 - 99 mg/dL 888 (H)  BUN 8 - 23 mg/dL 16  Creatinine 9.38 - 8.75 mg/dL 9.02  Calcium  8.9 - 10.3 mg/dL 9.6  Anion gap 5 - 15  7  Alkaline Phosphatase 38 - 126 U/L 72  Albumin  3.5 - 5.0 g/dL 3.7  AST 15 - 41 U/L 25  ALT 0 - 44 U/L 15  Total Protein 6.5 - 8.1 g/dL 6.6  Total Bilirubin 0.0 - 1.2 mg/dL 0.8  GFR, Est Non African American >60 mL/min >60  WBC 4.0 - 10.5 K/uL 11.6 (H)  RBC 4.22 -  5.81 MIL/uL 2.86 (L)  Hemoglobin 13.0 - 17.0 g/dL 8.7 (L)  HCT 60.9 - 47.9 % 28.6 (L)  MCV 80.0 - 100.0 fL 100.0  MCH 26.0 - 34.0 pg 30.4  MCHC 30.0 - 36.0 g/dL 69.5  RDW 88.4 - 84.4 % 15.3  Platelets 150 - 400 K/uL 236  nRBC 0.0 - 0.2 % 0.0  Neutrophils % 90  Lymphocytes % 4  Monocytes Relative % 4  Eosinophil % 0  Basophil % 0  Immature Granulocytes % 2  NEUT# 1.7 - 7.7 K/uL 10.4 (  H)  Lymphs Abs 0.7 - 4.0 K/uL 0.4 (L)  Monocyte # 0.1 - 1.0 K/uL 0.5  Eosinophils Absolute 0.0 - 0.5 K/uL 0.0  Basophils Absolute 0.0 - 0.1 K/uL 0.1  Abs Immature Granulocytes 0.00 - 0.07 K/uL 0.24 (H)  (L): Data is abnormally low (H): Data is abnormally high   has a past medical history of Acute GI bleeding (02/18/2020), Allergy , Blood transfusion without reported diagnosis, Chronic back pain, Chronic rhinitis, Clotting disorder (HCC) (2022@2024 ), Compressed spine fracture (HCC), COPD (chronic obstructive pulmonary disease) (HCC), Dyspnea, Emphysema lung (HCC), Emphysema of lung (HCC), GERD (gastroesophageal reflux disease), Hyperglycemia (06/12/2016), Hypertension (2/24), Iron  deficiency anemia due to chronic blood loss (08/23/2020), Left ankle swelling (07/22/2014), Leukocytosis (12/19/2016), Melena (08/11/2022), On home oxygen therapy, Onychomycosis, Orthostatic hypotension, Osteoporosis, Oxygen deficiency, PAD (peripheral artery disease) (HCC), Pneumonia, Pulmonary embolism (HCC) (04/07/2018), Skin cancer, Small cell lung cancer, right (HCC) (2023), and Vertigo.   reports that he quit smoking about 6 years ago. His smoking use included cigarettes and pipe. He started smoking about 58 years ago. He has a 104 pack-year smoking history. He has never been exposed to tobacco smoke. He has never used smokeless tobacco.  Past Surgical History:  Procedure Laterality Date   ABDOMINAL AORTOGRAM W/LOWER EXTREMITY N/A 01/21/2020   Procedure: ABDOMINAL AORTOGRAM W/LOWER EXTREMITY;  Surgeon: Harvey Carlin BRAVO, MD;   Location: [facility];  Service: Cardiovascular;  Laterality: N/A;   APPENDECTOMY     BIOPSY  02/20/2020   Procedure: BIOPSY;  Surgeon: Wilhelmenia Aloha Raddle., MD;  Location[facility] ENDOSCOPY;  Service: Gastroenterology;;   BIOPSY  08/12/2022   Procedure: BIOPSY;  Surgeon: San Sandor GAILS, DO;  Location: [facility] ENDOSCOPY;  Service: Gastroenterology;;   BRONCHIAL BIOPSY  09/04/2021   Procedure: BRONCHIAL BIOPSIES;  Surgeon: Brenna Adine CROME, DO;  Location: [facility] ENDOSCOPY;  Service: Pulmonary;;   BRONCHIAL BRUSHINGS  09/04/2021   Procedure: BRONCHIAL BRUSHINGS;  Surgeon: Brenna Adine CROME, DO;  Location: [facility];  Service: Pulmonary;;   BRONCHIAL NEEDLE ASPIRATION BIOPSY  09/04/2021   Procedure: BRONCHIAL NEEDLE ASPIRATION BIOPSIES;  Surgeon: Brenna Adine CROME, DO;  Location: [facility] ENDOS[facility];  Service: Pulmonary;;   CATARACT EXTRACTION, BILATERAL     CHOLECYSTECTOMY     ENDARTERECTOMY FEMORAL Right 04/19/2019   Procedure: ENDARTERECTOMY RIGHT COMMON FEMORAL;  Surgeon: Harvey Carlin BRAVO, MD;  Location: [facility] OR;  Service: Vascular;  Laterality: Right;   ENDARTERECTOMY FEMORAL Left 01/24/2020   Procedure: LEFT FEMORAL ENDARTERECTOMY WITH DACRON PATCH ANGIOPLASTY;  Surgeon: Harvey Carlin BRAVO, MD;  Location: [facility] OR;  Service: Vascular;  Laterality: Left;   ESOPHAGOGASTRODUODENOSCOPY (EGD) WITH PROPOFOL  N/A 02/20/2020   Procedure: ESOPHAGOGASTRODUODENOSCOPY (EGD) WITH PROPOFOL ;  Surgeon: Wilhelmenia Aloha Raddle., MD;  Location: [facility] ENDOSCOPY;  Service: Gastroenterology;  Laterality: N/A;   ESOPHAGOGASTRODUODENOSCOPY (EGD) WITH PROPOFOL  N/A 08/12/2022   Procedure: ESOPHAGOGASTRODUODENOSCOPY (EGD) WITH PROPOFOL ;  Surgeon: San Sandor GAILS, DO;  [facility];  Service: Gastroenterology;  Laterality: N/A;   EYE SURGERY  2019   Cataract   FEMORAL ENDARTERECTOMY Left 01/24/2020   FIDUCIAL MARKER PLACEMENT  09/04/2021   Procedure: FIDUCIAL MARKER PLACEMENT;  Surgeon: Brenna Adine CROME, DO;   Location: [facility]OS[facility];  Service: Pulmonary;;   FRACTURE SURGERY  2019   HEMOSTASIS CLIP PLACEMENT  02/20/2020   Procedure: HEMOSTASIS CLIP PLACEMENT;  Surgeon: Wilhelmenia Aloha Raddle., MD;  Location: [facility] ENDOSCOPY;  Service: Gastroenterology;;   HEMOSTASIS CLIP PLACEMENT  08/12/2022   Procedure: HEMOSTASIS CLIP PLACEMENT;  Surgeon: San Sandor GAILS, DO;  Location[facility]  ENDOSCOPY;  Service: Gastroenterology;;   HOT HEMOSTASIS N/A 02/20/2020   Procedure: HOT HEMOSTASIS (ARGON PLASMA COAGULATION/BICAP);  Surgeon: Wilhelmenia Aloha Raddle., MD;  Location: [facility] ENDOSCOPY;  Service: Gastroenterology;  Laterality: N/A;   HOT HEMOSTASIS N/A 08/12/2022   Procedure: HOT HEMOSTASIS (ARGON PLASMA COAGULATION/BICAP);  Surgeon: San Sandor GAILS, DO;  Location: [facility] ENDOSCOPY;  Service: Gastroenterology;  Laterality: N/A;   INSERTION OF ILIAC STENT Left 01/24/2020   Procedure: INSERTION OF VBX STENT 8X59 AND 8X39 LEFT COMMON ILIAC ARTERY. INSERTION OF INNOVA 7 X 60 INNOVA STENT LEFT EXTERNAL ILIAC ARTERY. ;  Surgeon: Harvey Carlin BRAVO, MD;  Location: [facility] OR;  Service: Vascular;  Laterality: Left;   LOWER EXTREMITY ANGIOGRAPHY  12/11/2018   LOWER EXTREMITY ANGIOGRAPHY N/A 12/11/2018   Procedure: LOWER EXTREMITY ANGIOGRAPHY;  Surgeon: Harvey Carlin BRAVO, MD;  [facility];  Service: Cardiovascular;  Laterality: N/A;   PATCH ANGIOPLASTY Right 04/19/2019   Procedure: Patch Angioplasty;  Surgeon: Harvey Carlin BRAVO, MD;  Location: [facility] OR;  Service: Vascular;  Laterality: Right;   PERIPHERAL VASCULAR INTERVENTION Right 12/11/2018   Procedure: PERIPHERAL VASCULAR INTERVENTION;  Surgeon: Harvey Carlin BRAVO, MD;  [facility];  Service: Cardiovascular;  Laterality: Right;  Common Iliac    SKIN CANCER EXCISION     lips, face, ears, arms (04/07/2018)   SUBMUCOSAL INJECTION  08/12/2022   Procedure: SUBMUCOSAL INJECTION;  Surgeon: San Sandor GAILS, DO;  Location: [facility] ENDOS[facility];  Service:  Gastroenterology;;   TONSILLECTOMY     ULTRASOUND GUIDANCE FOR VASCULAR ACCESS Right 01/24/2020   Procedure: ULTRASOUND GUIDANCE FOR VASCULAR ACCESS;  Surgeon: Harvey Carlin BRAVO, MD;  Location: [facility] OR;  Service: Vascular;  Laterality: Right;   VIDEO BRONCHOSCOPY WITH RADIAL ENDOBRONCHIAL ULTRASOUND  09/04/2021   Procedure: VIDEO BRONCHOSCOPY WITH RADIAL ENDOBRONCHIAL ULTRASOUND;  Surgeon: Brenna Adine CROME, DO;  Location: [facility] ENDOSCOPY;  Service: Pulmonary;;    Allergies  Allergen Reactions   Tape Other (See Comments)    Skin tears easily   Augmentin  [Amoxicillin -Pot Clavulanate] Other (See Comments)    Thrush Sore throat Hoarse voice   Ciprofloxacin  Other (See Comments)    hallucinations   Gadavist  [Gadobutrol ] Other (See Comments)    Unknown reaction   Gadolinium Itching, Rash and Other (See Comments)    Itchy back rash   Lipitor [Atorvastatin ] Rash    Immunization History  Administered Date(s) Administered   Fluad Quad(high Dose 65+) 03/16/2019, 03/10/2020   Fluad Trivalent(High Dose 65+) 03/14/2023   Influenza Split 04/24/2012   Influenza Whole 05/08/2009, 04/08/2011   Influenza, High Dose Seasonal PF 05/01/2017, 03/31/2018, 04/17/2021, 04/03/2022   Influenza,inj,Quad PF,6+ Mos 04/27/2013, 04/15/2014, 04/24/2015   Influenza,inj,quad, With Preservative 05/01/2018   Influenza-Unspecified 05/13/2016   PFIZER Comirnaty(Gray Top)Covid-19 Tri-Sucrose Vaccine 11/24/2020, 04/03/2022, 03/17/2023   PFIZER(Purple Top)SARS-COV-2 Vaccination 07/30/2019, 08/20/2019, 04/07/2020   Pfizer Covid-19 Vaccine Bivalent Booster 29yrs & up 03/16/2021   Pneumococcal Conjugate-13 02/25/2014   Pneumococcal Polysaccharide-23 08/15/2011, 12/19/2016   Pneumococcal-Unspecified 03/08/2017   Respiratory Syncytial Virus Vaccine,Recomb Aduvanted(Arexvy) 06/06/2022   Td 02/21/2010   Tdap 11/05/2017   Zoster, Live 01/29/2012    Family History  Problem Relation Age of Onset   Heart disease Mother         CABG in her 31s, nonsmoker   Cancer Mother    Stroke Father    Heart disease Father        Died of MI at age 15, smoker   Hepatitis Sister    Coronary artery disease Other  male 1st degree relative <60   Colon cancer Neg Hx    Pancreatic cancer Neg Hx    Esophageal cancer Neg Hx    Inflammatory bowel disease Neg Hx    Liver disease Neg Hx    Rectal cancer Neg Hx    Stomach cancer Neg Hx      Current Outpatient Medications:    acetaminophen  (TYLENOL ) 500 MG tablet, Take 500 mg by mouth every 4 (four) hours as needed for moderate pain, fever, headache or mild pain., Disp: , Rfl:    albuterol  (VENTOLIN  HFA) 108 (90 Base) MCG/ACT inhaler, Inhale 2 puffs into the lungs every 6 (six) hours as needed for wheezing or shortness of breath., Disp: , Rfl:    azithromycin  (ZITHROMAX ) 250 MG tablet, TAKE 1 TABLET BY MOUTH 3 TIMES  WEEKLY, Disp: 39 tablet, Rfl: 3   Calcium  Carb-Cholecalciferol  (CALCIUM  + D3 PO), Take 1 tablet by mouth daily with breakfast., Disp: , Rfl:    docusate sodium  (COLACE) 100 MG capsule, Take 2 capsules (200 mg total) by mouth 2 (two) times daily., Disp: 10 capsule, Rfl: 0   ELIQUIS  2.5 MG TABS tablet, TAKE 1 TABLET BY MOUTH TWICE  DAILY, Disp: 180 tablet, Rfl: 3   Ensifentrine  3 MG/2.5ML SUSP, Inhale 3 mg into the lungs in the morning and at bedtime., Disp: 450 mL, Rfl: 1   Ferrous Sulfate  (IRON ) 325 (65 Fe) MG TABS, Take 325 mg by mouth See admin instructions. Take 325 mg by mouth with food every other day, Disp: , Rfl:    fluticasone  (FLONASE ) 50 MCG/ACT nasal spray, USE 2 SPRAYS IN BOTH NOSTRILS  DAILY, Disp: 48 g, Rfl: 3   folic acid  (FOLVITE ) 1 MG tablet, Take 1 tablet (1 mg total) by mouth daily., Disp: , Rfl:    furosemide  (LASIX ) 20 MG tablet, TAKE 1 TABLET BY MOUTH DAILY AS  NEEDED FOR FLUID OR EDEMA, Disp: 90 tablet, Rfl: 3   ipratropium-albuterol  (DUONEB) 0.5-2.5 (3) MG/3ML SOLN, TAKE 3 MLS BY NEBULIZATION 3 (THREE) TIMES DAILY., Disp: 810 mL, Rfl: 0    Multiple Vitamin (MULTIVITAMIN WITH MINERALS) TABS tablet, Take 1 tablet by mouth daily., Disp: , Rfl:    mupirocin  ointment (BACTROBAN ) 2 %, APPLY TOPICALLY TWICE DAILY FOR  SKIN INFECTIONS, Disp: 60 g, Rfl: 3   OXYGEN, Inhale 4.5 L/min into the lungs continuous., Disp: , Rfl:    pantoprazole  (PROTONIX ) 20 MG tablet, TAKE 1 TABLET BY MOUTH DAILY, Disp: 90 tablet, Rfl: 3   predniSONE  (DELTASONE ) 10 MG tablet, TAKE 1 TABLET BY MOUTH DAILY  WITH BREAKFAST, Disp: 90 tablet, Rfl: 3   rosuvastatin  (CRESTOR ) 40 MG tablet, Take 1 tablet (40 mg total) by mouth daily., Disp: 90 tablet, Rfl: 3   SPIRIVA  RESPIMAT 2.5 MCG/ACT AERS, USE 2 INHALATIONS BY MOUTH ONCE  DAILY, Disp: 12 g, Rfl: 3   SYMBICORT  160-4.5 MCG/ACT inhaler, INHALE 2 INHALATIONS BY MOUTH  TWICE DAILY, Disp: 30.6 g, Rfl: 3   SYSTANE PRESERVATIVE FREE 0.4-0.3 % SOLN, Place 1 drop into both eyes 2 (two) times daily., Disp: , Rfl:    traMADol  (ULTRAM ) 50 MG tablet, Take 50 mg by mouth every 12 (twelve) hours as needed for severe pain., Disp: , Rfl:       Objective:   Vitals:   01/14/24 1320  BP: (!) 95/55  Pulse: (!) 113  SpO2: 97%    Estimated body mass index is 20.82 kg/m as calculated from the following:   Height as of 08/21/23:  6' (1.829 m).   Weight as of 08/21/23: 153 lb 8 oz (69.6 kg).  @WEIGHTCHANGE @  There were no vitals filed for this visit.   Physical Exam   General: No distress. Cushingiod. No overt distress.  O2 at rest: yes Cane present: np Sitting in wheel chair: no but his hime chair Frail: yes Obese: no Neuro: Alert and Oriented x 3. GCS 15. Speech normal Psych: Pleasant Resp:, No overt respiratory distress CVS: Normal heart sounds. Murmurs - no Ext: Stigmata of Connective Tissue Disease - no HEENT: Normal upper airway. PEERL +. No post nasal drip        Assessment:       ICD-10-CM   1. Chronic respiratory failure with hypoxia and hypercapnia (HCC) - baseline PCO2 in the 70s  J96.11 Ambulatory  referral to Hospice   J96.12     2. Caregiver burden  Z63.6 Ambulatory referral to Hospice    3. Goals of care, counseling/discussion  Z71.89 Ambulatory referral to Hospice          Discussed medicare hospice benefit  - a medicare paid benefit  - for people with terminal qualifying  illness such as IPF, COPD, cancer with statistical prognosis < 6 months for which there is no cure - utilization of hospice shows people live longer paradoxically than those without hospice due to improved attention  - explained hospice locations - home, residential etc.,   - explained respite care options for caregivers  - explained that she could still get treatment for non-hospice diagnosis and still come to office to see me for hospice related diagnosis that i provide support for  - explained that hospice provides nursing, MD, chaplain, volunteer and medications and supplies paid through medicare  Plan:     Patient Instructions     ICD-10-CM   1. Chronic respiratory failure with hypoxia and hypercapnia (HCC) - baseline PCO2 in the 70s  J96.11    J96.12     2. Caregiver burden  Z63.6     3. Goals of care, counseling/discussion  Z71.89         Recommendations  - REFER HOME HOSPICE  = they will have goals of care on medicine and coce status etc - - Continue Ohtuvayre  nebulizer morning and evening  - - Continue -5.5L oxygen to maintain O2 >88-90%  - Continue noninvasive positive pressure ventilation at night-via ventilator with seems to be working better for you - Continue other treatment for COPD etc. -You do not need PFTs anymore  Follow-up: 3 months video visit with Dr. Geronimo BOSCH Return in about 3 months (around 04/15/2024) for VIDEO VISIT, with Dr Geronimo, with any of the APPS.    SIGNATURE    Dr. Dorethia Geronimo, M.D., F.C.C.P,  Pulmonary and Critical Care Medicine Staff Physician, [facility] Center Director - Interstitial Lung Disease  Program   Pulmonary Fibrosis [facility] at [facility] [place], [place], [address]  Pager: [phone], If no answer or between  15:00h - 7:00h: call [phone] Telephone: [phone]  2:04 PM 01/14/2024

## 2024-01-14 NOTE — Telephone Encounter (Signed)
 Advised and acknowledged during visit today

## 2024-01-14 NOTE — Patient Instructions (Addendum)
 ICD-10-CM   1. Chronic respiratory failure with hypoxia and hypercapnia (HCC) - baseline PCO2 in the 70s  J96.11    J96.12     2. Caregiver burden  Z63.6     3. Goals of care, counseling/discussion  Z71.89         Recommendations  - REFER HOME HOSPICE  = they will have goals of care on medicine and coce status etc - - Continue O[place]  nebulizer morning and evening  - - Continue -5.5L oxygen to maintain O2 >88-90%  - Continue noninvasive positive pressure ventilation at night-via ventilator with seems to be working better for you - Continue other treatment for COPD etc. -You do not need PFTs anymore  Follow-up: 3 months video visit with Dr. Geronimo

## 2024-01-16 NOTE — Telephone Encounter (Signed)
 FYI

## 2024-01-16 NOTE — Telephone Encounter (Signed)
FYI from patient

## 2024-01-18 ENCOUNTER — Encounter: Payer: Self-pay | Admitting: Family Medicine

## 2024-01-18 ENCOUNTER — Emergency Department (HOSPITAL_COMMUNITY)
Admission: EM | Admit: 2024-01-18 | Discharge: 2024-01-18 | Disposition: A | Discharge status: Home / Self Care | Attending: Emergency Medicine | Admitting: Emergency Medicine

## 2024-01-18 ENCOUNTER — Other Ambulatory Visit: Payer: Self-pay

## 2024-01-18 ENCOUNTER — Encounter (HOSPITAL_COMMUNITY): Payer: Self-pay

## 2024-01-18 DIAGNOSIS — Z7901 Long term (current) use of anticoagulants: Secondary | ICD-10-CM | POA: Insufficient documentation

## 2024-01-18 DIAGNOSIS — Z7951 Long term (current) use of inhaled steroids: Secondary | ICD-10-CM | POA: Insufficient documentation

## 2024-01-18 DIAGNOSIS — J449 Chronic obstructive pulmonary disease, unspecified: Secondary | ICD-10-CM | POA: Insufficient documentation

## 2024-01-18 DIAGNOSIS — I110 Hypertensive heart disease with heart failure: Secondary | ICD-10-CM | POA: Insufficient documentation

## 2024-01-18 DIAGNOSIS — Z85118 Personal history of other malignant neoplasm of bronchus and lung: Secondary | ICD-10-CM | POA: Insufficient documentation

## 2024-01-18 DIAGNOSIS — M7981 Nontraumatic hematoma of soft tissue: Secondary | ICD-10-CM | POA: Insufficient documentation

## 2024-01-18 DIAGNOSIS — I509 Heart failure, unspecified: Secondary | ICD-10-CM | POA: Diagnosis not present

## 2024-01-18 DIAGNOSIS — T148XXA Other injury of unspecified body region, initial encounter: Secondary | ICD-10-CM

## 2024-01-18 DIAGNOSIS — Z79899 Other long term (current) drug therapy: Secondary | ICD-10-CM | POA: Insufficient documentation

## 2024-01-18 LAB — CBC WITH DIFFERENTIAL/PLATELET
Abs Immature Granulocytes: 0.11 K/uL — ABNORMAL HIGH (ref 0.00–0.07)
Basophils Absolute: 0 K/uL (ref 0.0–0.1)
Basophils Relative: 0 %
Eosinophils Absolute: 0 K/uL (ref 0.0–0.5)
Eosinophils Relative: 0 %
HCT: 30.2 % — ABNORMAL LOW (ref 39.0–52.0)
Hemoglobin: 9.1 g/dL — ABNORMAL LOW (ref 13.0–17.0)
Immature Granulocytes: 1 %
Lymphocytes Relative: 4 %
Lymphs Abs: 0.4 K/uL — ABNORMAL LOW (ref 0.7–4.0)
MCH: 31.5 pg (ref 26.0–34.0)
MCHC: 30.1 g/dL (ref 30.0–36.0)
MCV: 104.5 fL — ABNORMAL HIGH (ref 80.0–100.0)
Monocytes Absolute: 0.4 K/uL (ref 0.1–1.0)
Monocytes Relative: 4 %
Neutro Abs: 9.3 K/uL — ABNORMAL HIGH (ref 1.7–7.7)
Neutrophils Relative %: 91 %
Platelets: 235 K/uL (ref 150–400)
RBC: 2.89 MIL/uL — ABNORMAL LOW (ref 4.22–5.81)
RDW: 16.3 % — ABNORMAL HIGH (ref 11.5–15.5)
WBC: 10.3 K/uL (ref 4.0–10.5)
nRBC: 0 % (ref 0.0–0.2)

## 2024-01-18 LAB — PROTIME-INR
INR: 0.9 (ref 0.8–1.2)
Prothrombin Time: 12.7 s (ref 11.4–15.2)

## 2024-01-18 LAB — BASIC METABOLIC PANEL WITH GFR
Anion gap: 10 (ref 5–15)
BUN: 9 mg/dL (ref 8–23)
CO2: 38 mmol/L — ABNORMAL HIGH (ref 22–32)
Calcium: 9 mg/dL (ref 8.9–10.3)
Chloride: 92 mmol/L — ABNORMAL LOW (ref 98–111)
Creatinine, Ser: 0.92 mg/dL (ref 0.61–1.24)
GFR, Estimated: >60 mL/min (ref >60)
Glucose, Bld: 102 mg/dL — ABNORMAL HIGH (ref 70–99)
Potassium: 3.9 mmol/L (ref 3.5–5.1)
Sodium: 140 mmol/L (ref 135–145)

## 2024-01-18 LAB — FIBRINOGEN: Fibrinogen: 486 mg/dL — ABNORMAL HIGH (ref 210–475)

## 2024-01-18 LAB — TECHNOLOGIST SMEAR REVIEW: Plt Morphology: NORMAL

## 2024-01-18 LAB — MAGNESIUM: Magnesium: 2.1 mg/dL (ref 1.7–2.4)

## 2024-01-18 LAB — APTT: aPTT: 31 s (ref 24–36)

## 2024-01-18 NOTE — ED Provider Triage Note (Signed)
 Emergency Medicine Provider Triage Evaluation Note  Francisco [name] , a 78 y.o. male  was evaluated in triage.  Pt complains of bruising that is getting worse since 7/4.  Started in his upper back.  He had a CT scan since then without any obvious finding.  His blood work revealed anemia with hemoglobin of 8.7.  His Eliquis  was stopped on 7/9. He was recommended to come into [facility] for eval at that time however wife states he was unable to make it until today.  Review of Systems  Positive: As above Negative: As above  Physical Exam  BP 117/75 (BP Location: Left Leg)   Pulse (!) 108   Temp 98.8 F (37.1 C)   Resp 18   SpO2 100%  Gen:   Awake, no distress   Resp:  Normal effort  MSK:   Moves extremities without difficulty  Other:   Medical Decision Making  Medically screening exam initiated at 1:49 PM.  Appropriate orders placed.  Francisco [name] was informed that the remainder of the evaluation will be completed by another provider, this initial triage assessment does not replace that evaluation, and the importance of remaining in the ED until their evaluation is complete.     Francisco Loge, PA-C 01/18/24 1351

## 2024-01-18 NOTE — ED Triage Notes (Addendum)
 Pt started getting random bruising on his abd, back and flank over the past several weeks. Pt had labs done at the cancer center last week, hgb was 8.7 Pt denies pain to those areas but is more SOB recently. Pt wears 5.5L [place] at baseline.Pt was taken off his Eliquis  on 7/9

## 2024-01-18 NOTE — ED Provider Notes (Signed)
 [place] [facility] Provider Note   CSN: [id number] Arrival date & time: 01/18/24  1320   Patient presents with: Bleeding/Bruising and Anemia   Francisco [name] is a 78 y.o. 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 Patient and wife report that he has been developing large bruises, mostly around his abdomen, flanks, pelvis, and upper legs, for almost 2 weeks. His PCP was made aware on 7/9 and recommended he discontinue his 2.5mg  BID eliquis , which patient did. He also had labs last week at the Cancer center which showed no major abnormalities and a Hb of 8.7 (baseline ~10-11). He denies any traumas, falls, or even lightly bumping into furniture in the areas of his bruises. He has never had this issue before. He states some of the areas of large bruising are mildly sore but otherwise denies significant pain. Denies any tarry or bloody stools but does take iron  supplements so his stool is at baseline dark color. He otherwise feels at his baseline level of health.    Prior to Admission medications   Medication Sig Start Date End Date Taking? Authorizing Provider  acetaminophen  (TYLENOL ) 500 MG tablet Take 500 mg by mouth every 4 (four) hours as needed for moderate pain, fever, headache or mild pain.    [provider]  albuterol  (VENTOLIN  HFA) 108 (90 Base) MCG/ACT inhaler Inhale 2 puffs into the lungs every 6 (six) hours as needed for wheezing or shortness of breath.    [provider]  azithromycin  (ZITHROMAX ) 250 MG tablet TAKE 1 TABLET BY MOUTH 3 TIMES  WEEKLY 11/10/23   Geronimo Amel, MD  Calcium  Carb-Cholecalciferol  (CALCIUM  + D3 PO) Take 1 tablet by mouth daily with breakfast.    [provider]  docusate sodium  (COLACE) 100 MG capsule Take 2 capsules (200 mg total) by mouth 2  (two) times daily. 08/17/22   Elgergawy, Brayton RAMAN, MD  doxycycline  (VIBRA -TABS) 100 MG tablet Take 1 tablet (100 mg total) by mouth 2 (two) times daily. 01/19/24   [name], MD  ELIQUIS  2.5 MG TABS tablet TAKE 1 TABLET BY MOUTH TWICE  DAILY Patient not taking: Reported on 01/19/2024 04/28/23   [name], MD  Ensifentrine  3 MG/2.5ML SUSP Inhale 3 mg into the lungs in the morning and at bedtime. 08/14/23   Hope Almarie LELON, NP  Ferrous Sulfate  (IRON ) 325 (65 Fe) MG TABS Take 325 mg by mouth See admin instructions. Take 325 mg by mouth with food every other day    [provider]  fluticasone  (FLONASE ) 50 MCG/ACT nasal spray USE 2 SPRAYS IN BOTH NOSTRILS  DAILY 09/10/23   [name], MD  folic acid  (FOLVITE ) 1 MG tablet Take 1 tablet (1 mg total) by mouth daily. 02/26/20   Mikhail, Maryann, DO  furosemide  (LASIX ) 20 MG tablet TAKE 1 TABLET BY MOUTH DAILY AS  NEEDED FOR FLUID OR EDEMA 11/10/23   [name], MD  ipratropium-albuterol  (DUONEB) 0.5-2.5 (3) MG/3ML SOLN TAKE 3 MLS BY NEBULIZATION 3 (THREE) TIMES DAILY. 09/10/23 01/14/24  Hope Almarie LELON, NP  Multiple Vitamin (MULTIVITAMIN WITH MINERALS) TABS tablet Take 1 tablet by mouth daily. 02/26/20   Mikhail, Maryann, DO  mupirocin  ointment (BACTROBAN ) 2 % APPLY TOPICALLY TWICE DAILY FOR  SKIN INFECTIONS 11/11/23   [name], MD  OXYGEN Inhale 4.5 L/min into the lungs continuous.    [provider]  pantoprazole  (PROTONIX ) 20 MG tablet TAKE 1 TABLET BY MOUTH DAILY 11/10/23   [name], MD  predniSONE  (DELTASONE ) 10 MG tablet TAKE 1 TABLET BY MOUTH DAILY  WITH BREAKFAST 11/10/23   Geronimo Amel, MD  predniSONE  (DELTASONE ) 10 MG tablet Take 4 tablets (40 mg total) by mouth daily with breakfast for 4 days, THEN 3 tablets (30 mg total) daily with breakfast for 4 days, THEN 2 tablets (20 mg total) daily with breakfast for 4 days. 01/19/24 01/31/24  [name], MD  rosuvastatin  (CRESTOR ) 40 MG tablet  Take 1 tablet (40 mg total) by mouth daily. 08/27/23   [name], MD  SPIRIVA  RESPIMAT 2.5 MCG/ACT AERS USE 2 INHALATIONS BY MOUTH ONCE  DAILY 11/03/23   Geronimo Amel, MD  SYMBICORT  160-4.5 MCG/ACT inhaler INHALE 2 INHALATIONS BY MOUTH  TWICE DAILY 09/29/23   [name], MD  SYSTANE PRESERVATIVE FREE 0.4-0.3 % SOLN Place 1 drop into both eyes 2 (two) times daily.    [provider]  traMADol  (ULTRAM ) 50 MG tablet Take 50 mg by mouth every 12 (twelve) hours as needed for severe pain.    [provider]    Allergies: Tape, Augmentin  [amoxicillin -pot clavulanate], Ciprofloxacin , Gadavist  [gadobutrol ], Gadolinium, and Lipitor [atorvastatin ]      Updated Vital Signs BP (!) 141/84   Pulse (!) 102   Temp 98.8 F (37.1 C)   Resp 15   SpO2 100%   Physical Exam Vitals reviewed.  Constitutional:      General: He is not in acute distress.    Appearance: He is not toxic-appearing or diaphoretic.  HENT:     Head: Normocephalic and atraumatic.     Mouth/Throat:     Mouth: Mucous membranes are moist.     Pharynx: Oropharynx is clear.     Comments: No oropharyngeal lesions/mucosal bleeding Eyes:     Extraocular Movements: Extraocular movements intact.     Conjunctiva/sclera: Conjunctivae normal.     Pupils: Pupils are equal, round, and reactive to light.  Cardiovascular:     Rate and Rhythm: Regular rhythm.     Heart sounds: No murmur heard.    No gallop.     Comments: Mild tachycardia Pulmonary:     Effort: Pulmonary effort is normal. No respiratory distress.     Breath sounds: No stridor. No wheezing or rhonchi.     Comments: On baseline 5.5L [place] Chest:     Chest wall: No tenderness.  Abdominal:     General: Abdomen is flat.     Palpations: Abdomen is soft.     Tenderness: There is no abdominal tenderness. There is no guarding.  Genitourinary:    Penis: Normal.      Testes: Normal.  Musculoskeletal:        General: No tenderness or deformity.      Cervical back: Normal range of motion and neck supple. No rigidity or tenderness.     Right lower leg: No edema.     Left lower leg: No edema.     Comments: ROM at baseline, slow but full  Skin:    General: Skin is warm and dry.     Capillary Refill: Capillary refill takes less than 2 seconds.     Coloration: Skin is not jaundiced.     Findings: Bruising (large ecchymoses scattered on abdomen and lower backs (L>R), also over pubis and on upper  thighs. No significant palpable hematomas or severe associated tenderness) present.     Comments: Large soft mass at the left upper back concerning for lipoma, nontender and well-circumscribed  Neurological:     Mental Status: He is alert and oriented to person, place, and time. Mental status is at baseline.     (all labs ordered are listed, but only abnormal results are displayed) Labs Reviewed  CBC WITH DIFFERENTIAL/PLATELET - Abnormal; Notable for the following components:      Result Value   RBC 2.89 (*)    Hemoglobin 9.1 (*)    HCT 30.2 (*)    MCV 104.5 (*)    RDW 16.3 (*)    Neutro Abs 9.3 (*)    Lymphs Abs 0.4 (*)    Abs Immature Granulocytes 0.11 (*)    All other components within normal limits  BASIC METABOLIC PANEL WITH GFR - Abnormal; Notable for the following components:   Chloride 92 (*)    CO2 38 (*)    Glucose, Bld 102 (*)    All other components within normal limits  FIBRINOGEN  - Abnormal; Notable for the following components:   Fibrinogen  486 (*)    All other components within normal limits  MAGNESIUM   PROTIME-INR  APTT  TECHNOLOGIST SMEAR REVIEW    EKG: EKG Interpretation Date/Time:  Sunday January 18 2024 13:33:10 EDT Ventricular Rate:  106 PR Interval:  170 QRS Duration:  72 QT Interval:  348 QTC Calculation: 462 R Axis:   -54  Text Interpretation: Sinus tachycardia with Premature supraventricular complexes Left axis deviation Poor data quality Abnormal ECG Confirmed by Garrick Charleston [phone]) on 01/18/2024  4:29:18 PM  Radiology: No results found.   Medications Ordered in the ED - No data to display  Clinical Course as of 01/19/24 1310  Mon Jan 19, 2024  1258 CBC with Differential(!) Hb 9.1 (up from 8.7 last week), MCV 104.5 (slowly uptrending from previously mid-low 90s), platelets normal at 235 [AD]  1258 Prothrombin Time: 12.7 INR 0.9 [AD]  1259 APTT: 31 [AD]  1259 Fibrinogen (!): 486 Very mildly elevated [AD]    Clinical Course User Index [AD] Raoul Rake, MD    Medical Decision Making Patient with the above history is presenting with near 2 weeks of abnormal large bruising scattered on his inferior trunk and hips/upper thighs. He has been on long-term low dose eliquis  for a prior PE which was discontinued 4 days ago per his PCP in regards to his bruising. He denies any significant trauma/injuries in the areas of the bruising and given the circumferential nature of the bruising, low concern for them being related to chronic pressure pathology. On exam, the bruises all appear to be in the early-mid stages of healing with no obviously new bruises, and they are only mildly tender in the areas of largest ecchymoses. These bruises may be d/t eliquis  use, though patient has no prior history of these bruises and has been stable on low-dose eliquis  for at least 1.5 years per him. Given his history of CA and atraumatic nature of bruises, may be d/t underlying coagulopathy or atypical platelet function. Will check basic labs, coags, fibrinogen , and peripheral smear.  As above, labs resulted with slightly improved Hb from 1 week prior, mildly worsening macrocytosis, normal platelet count, normal coags, and borderline high-normal fibrinogen . Peripheral smear pending at time of discharge. Patient is otherwise stable and does not meet criteria for further workup to be completed inpatient. Discussed strict return precautions and recommended very close  PCP follow up to review peripheral smear  results and to initiate any further workup. Recommended discontinuing eliquis  until further discussion with PCP  Amount and/or Complexity of Data Reviewed Labs: ordered. Decision-making details documented in ED Course.    Final diagnoses:  Bruising    ED Discharge Orders     None          Raoul Rake, MD 01/19/24 1311    Garrick Charleston, MD 01/21/24 1408

## 2024-01-18 NOTE — Discharge Instructions (Addendum)
 You were seen today for abnormal bruising. While you were here we monitored your vitals, preformed a physical exam, and lab tests. These were all reassuring and there is no indication for any further testing or intervention in the emergency department at this time.   Things to do:  - Follow up with your primary care provider within the next week regarding results of pending peripheral smear test, and for further management of your bruising - Continue not taking your eliquis  until follow up with your PCP regarding when it is safe to resume it  Return to the emergency department if you have any new or worsening symptoms, or if you have any other serious medical concerns.

## 2024-01-19 ENCOUNTER — Encounter: Payer: Self-pay | Admitting: Family Medicine

## 2024-01-19 ENCOUNTER — Ambulatory Visit (INDEPENDENT_AMBULATORY_CARE_PROVIDER_SITE_OTHER): Admitting: Family Medicine

## 2024-01-19 VITALS — BP 122/70 | HR 71 | Temp 97.9°F | Ht 72.0 in

## 2024-01-19 DIAGNOSIS — T148XXA Other injury of unspecified body region, initial encounter: Secondary | ICD-10-CM

## 2024-01-19 DIAGNOSIS — D649 Anemia, unspecified: Secondary | ICD-10-CM

## 2024-01-19 DIAGNOSIS — M549 Dorsalgia, unspecified: Secondary | ICD-10-CM

## 2024-01-19 DIAGNOSIS — J441 Chronic obstructive pulmonary disease with (acute) exacerbation: Secondary | ICD-10-CM

## 2024-01-19 MED ORDER — DOXYCYCLINE HYCLATE 100 MG PO TABS: 100.0000 mg | ORAL_TABLET | Freq: Two times a day (BID) | ORAL | 0 refills | Status: DC

## 2024-01-19 MED ORDER — PREDNISONE 10 MG PO TABS: ORAL_TABLET | ORAL | 0 refills | Status: AC

## 2024-01-19 NOTE — Patient Instructions (Addendum)
 We will call you within two weeks about your referral for ultrasound of area on left upper back  through [facility].  Their phone number is [phone].  Please call them if you have not heard in 1-2 weeks  Sent in prednisone  for lungs- if not improving in 2-3 days can take doxycycline   Please stop by lab before you go- pick up stool cardsg If you have mychart- we will send your results within 3 business days of us  receiving them.  If you do not have mychart- we will call you about results within 5 business days of us  receiving them.  *please also note that you will see labs on mychart as soon as they post. I will later go in and write notes on them- will say notes from Dr. Katrinka   Call back for labs at a time that's convenient- they do like an appointment on the books- labs are in right now  Lets wait until we get next labs to restart eliquis   Recommended follow up: Return for as needed for new, worsening, persistent symptoms.

## 2024-01-19 NOTE — Addendum Note (Signed)
 Addended by: [name] on: 01/19/2024 06:01 PM   Modules accepted: Level of Service

## 2024-01-19 NOTE — Progress Notes (Addendum)
 Phone [phone] In person visit   Subjective:   Francisco [name] is a 78 y.o. year old very pleasant male patient who presents for/with See problem oriented charting Chief Complaint  Patient presents with   ER f/u    Pt here for ER f/u due to bruising.    Past Medical History-  Patient Active Problem List   Diagnosis Date Noted   Lumbar compression fracture (HCC) 09/11/2022    Priority: High   Chronic systolic congestive heart failure (HCC) 12/10/2021    Priority: High   Malignant neoplasm of lower lobe of right lung (HCC)     Priority: High   PAD (peripheral artery disease) (HCC) 12/11/2018    Priority: High   Stage 4 very severe COPD by GOLD classification (HCC) 11/27/2018    Priority: High   DNR (do not resuscitate) 04/21/2018    Priority: High   Chronic pulmonary embolism (HCC) 04/07/2018    Priority: High   Osteoporosis 11/19/2017    Priority: High   Thoracic compression fracture (HCC) 11/02/2017    Priority: High   Former smoker 08/23/2008    Priority: High   Vitamin D  deficiency 03/03/2023    Priority: Medium    Syncope and collapse 02/20/2023    Priority: Medium    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    Priority: Medium    History of upper GI bleeding 04/30/2020    Priority: Medium    Essential tremor 03/31/2018    Priority: Medium    BPPV (benign paroxysmal positional vertigo), right 02/10/2018    Priority: Medium    Essential hypertension 11/02/2017    Priority: Medium    BPH associated with nocturia 06/25/2017    Priority: Medium    Hyperlipidemia 07/22/2014    Priority: Medium    Pre-operative respiratory examination 12/10/2018    Priority: Low   Venous stasis dermatitis of both lower extremities 11/02/2017    Priority: Low   Chronic respiratory failure with hypoxia and hypercapnia (HCC) - baseline PCO2 in the 70s 09/23/2017    Priority: Low   History of skin cancer 05/17/2014    Priority: Low   Chronic rhinitis 10/28/2012     Priority: Low   Pulmonary nodule 09/23/2011    Priority: Low   History of colonic polyps 08/23/2008    Priority: Low   Acute on chronic respiratory failure with hypoxia and hypercapnia (HCC) 07/11/2023   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   Skin tear of elbow without complication, left, initial encounter 07/10/2023   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     albuterol  (VENTOLIN  HFA) 108 (90 Base) MCG/ACT inhaler Inhale 2 puffs into the lungs every 6 (six) hours as needed for wheezing or shortness of breath.     azithromycin  (ZITHROMAX ) 250 MG tablet TAKE 1 TABLET BY MOUTH 3 TIMES  WEEKLY 39 tablet 3   Calcium  Carb-Cholecalciferol  (CALCIUM  + D3 PO) Take 1 tablet by mouth daily with breakfast.     docusate sodium  (COLACE) 100 MG capsule Take  2 capsules (200 mg total) by mouth 2 (two) times daily. 10 capsule 0   Ensifentrine  3 MG/2.5ML SUSP Inhale 3 mg into the lungs in the morning and at bedtime. 450 mL 1   Ferrous Sulfate  (IRON ) 325 (65 Fe) MG TABS Take 325 mg by mouth See admin instructions. Take 325 mg by mouth with food every other day     fluticasone  (FLONASE ) 50 MCG/ACT nasal spray USE 2 SPRAYS IN BOTH NOSTRILS  DAILY 48 g 3   folic acid  (FOLVITE ) 1 MG tablet Take 1 tablet (1 mg total) by mouth daily.     furosemide  (LASIX ) 20 MG tablet TAKE 1 TABLET BY MOUTH DAILY AS  NEEDED FOR FLUID OR EDEMA 90 tablet 3   Multiple Vitamin (MULTIVITAMIN WITH MINERALS) TABS tablet Take  1 tablet by mouth daily.     mupirocin  ointment (BACTROBAN ) 2 % APPLY TOPICALLY TWICE DAILY FOR  SKIN INFECTIONS 60 g 3   OXYGEN Inhale 4.5 L/min into the lungs continuous.     pantoprazole  (PROTONIX ) 20 MG tablet TAKE 1 TABLET BY MOUTH DAILY 90 tablet 3   predniSONE  (DELTASONE ) 10 MG tablet TAKE 1 TABLET BY MOUTH DAILY  WITH BREAKFAST 90 tablet 3   rosuvastatin  (CRESTOR ) 40 MG tablet Take 1 tablet (40 mg total) by mouth daily. 90 tablet 3   SPIRIVA  RESPIMAT 2.5 MCG/ACT AERS USE 2 INHALATIONS BY MOUTH ONCE  DAILY 12 g 3   SYMBICORT  160-4.5 MCG/ACT inhaler INHALE 2 INHALATIONS BY MOUTH  TWICE DAILY 30.6 g 3   SYSTANE PRESERVATIVE FREE 0.4-0.3 % SOLN Place 1 drop into both eyes 2 (two) times daily.     traMADol  (ULTRAM ) 50 MG tablet Take 50 mg by mouth every 12 (twelve) hours as needed for severe pain.     doxycycline  (VIBRA -TABS) 100 MG tablet Take 1 tablet (100 mg total) by mouth 2 (two) times daily. 14 tablet 0   ELIQUIS  2.5 MG TABS tablet TAKE 1 TABLET BY MOUTH TWICE  DAILY (Patient not taking: Reported on 01/19/2024) 180 tablet 3   ipratropium-albuterol  (DUONEB) 0.5-2.5 (3) MG/3ML SOLN TAKE 3 MLS BY NEBULIZATION 3 (THREE) TIMES DAILY. 810 mL 0   predniSONE  (DELTASONE ) 10 MG tablet Take 4 tablets (40 mg total) by mouth daily with breakfast for 4 days, THEN 3 tablets (30 mg total) daily with breakfast for 4 days, THEN 2 tablets (20 mg total) daily with breakfast for 4 days. 36 tablet 0   No current facility-administered medications for this visit.     Objective:  BP 122/70   Pulse 71   Temp 97.9 F (36.6 C)   Ht 6' (1.829 m)   SpO2 90%   BMI 20.82 kg/m  Gen: NAD, resting comfortably with home portable oxygen CV: RRR no murmurs rubs or gallops Lungs: Rhonchi and extensive wheeze without crackles in bilateral lungs Skin: warm, dry, extensive bruising in left low back around 2 left abdomen-no bruising on the right side.  Left upper and thoracic back there is a 20 x 8 cm area with some  fluctuance that is not tender      Assessment and Plan   # ED follow-up S: Patient with emergency department yesterday for extensive bruising on left low back.  Concern for retroperitoneal bleed with decreasing hemoglobin with most recent visit with Dr. Carmelina hemoglobin was stable on CBC-in fact mildly improved from 8.7-9.1.  Pro time INR was normal.  Apparently patient weeks ago felt severe pain in left upper back and sometime  after that developed the bruising in the left low back and around to the left abdomen and down to the left scrotum.  A week or so afterwards he quickly developed a mass in the left upper back as photographed above  Thankfully the bruising is not worsening.  He has been holding Eliquis  since late last week and we discussed by MyChart. A/P: Patient with extensive bruising of left lower back and about a week and a half later noted enlargement and left thoracic back with 20 x 8 cm mass-I wonder if this could be a hematoma that perhaps formed sometime after a tear when he had more severe pain in his left upper back.  Could be a lipoma but would be rather quick accumulation. -Remain off Eliquis  for now and agrees to come back in the next week or 2 for repeat CBC-if stable at that time we will restart his Eliquis  due to risk for pulmonary embolism with chronic PE history -We will check stools for anemia with history of GI bleed that she is on iron  daily I think it could be causing some darker stools but not clearly melena  # COPD flareup S: Patient on chronic medications for COPD including Chronic azithromycin  MWF as well as chronic prednisone , ensifentrine , hoem oxygen, spiriva , Symbicort , duonebs  Despite this in recent days has noted increasing cough, congestion, purulence A/P: Patient with COPD flare-hoping prednisone  will calm his down but if not I sent in doxycycline  for him to have available -He declines hospice as he wants to continue to maintain  treatments for other conditions   Recommended follow up: Return for as needed for new, worsening, persistent symptoms. Future Appointments  Date Time Provider Department Center  03/10/2024  9:00 AM [facility]  03/10/2024 10:00 AM [facility]  03/10/2024 10:30 AM [facility]  04/13/2024  2:45 PM Geronimo Amel, MD [facility] None  06/08/2024  1:30 PM [facility] 1 [facility]   06/21/2024  1:30 PM Lanell Donald Stagger, PA-C [facility] None    Lab/Order associations:   ICD-10-CM   1. Bruising  T14.8XXA     2. Upper back pain  M54.9 US  MiscellaneoUS Localization    Comprehensive metabolic panel with GFR    3. Anemia, unspecified type  D64.9 CBC with Differential/Platelet    IBC + Ferritin    Fecal occult blood, imunochemical    4. COPD exacerbation (HCC)  J44.1       Meds ordered this encounter  Medications   doxycycline  (VIBRA -TABS) 100 MG tablet    Sig: Take 1 tablet (100 mg total) by mouth 2 (two) times daily.    Dispense:  14 tablet    Refill:  0   predniSONE  (DELTASONE ) 10 MG tablet    Sig: Take 4 tablets (40 mg total) by mouth daily with breakfast for 4 days, THEN 3 tablets (30 mg total) daily with breakfast for 4 days, THEN 2 tablets (20 mg total) daily with breakfast for 4 days.    Dispense:  36 tablet    Refill:  0    Time Spent: 45 minutes of total time (11:35 AM- 12:20 PM) was spent on the date of the encounter performing the following actions: chart review prior to seeing the patient, obtaining history, performing a medically necessary exam, counseling on the treatment plan and working together on a plan for follow-up of his anemia and restarting Eliquis  as well as treating COPD, placing orders, and documenting in our EHR.  Return precautions advised.  Garnette Lukes, MD

## 2024-01-21 ENCOUNTER — Other Ambulatory Visit

## 2024-01-21 DIAGNOSIS — D649 Anemia, unspecified: Secondary | ICD-10-CM | POA: Diagnosis not present

## 2024-01-22 ENCOUNTER — Encounter: Payer: Self-pay | Admitting: Family Medicine

## 2024-01-22 ENCOUNTER — Other Ambulatory Visit: Payer: Self-pay | Admitting: Primary Care

## 2024-01-22 DIAGNOSIS — J449 Chronic obstructive pulmonary disease, unspecified: Secondary | ICD-10-CM

## 2024-01-22 NOTE — Telephone Encounter (Signed)
 Refill sent for OHTUVAYRE  to [facility]: [phone]  Dose: 3mg  neb twice daily  Last OV: 01/14/2024 (referral to hospice placed) Provider: Dr. Geronimo  Next OV: not scheduled  Sherry Pennant, PharmD, MPH, BCPS Clinical Pharmacist (Rheumatology and Pulmonology)

## 2024-01-23 ENCOUNTER — Ambulatory Visit
Admission: RE | Admit: 2024-01-23 | Discharge: 2024-01-23 | Disposition: A | Discharge status: Home / Self Care | Source: Ambulatory Visit | Attending: Family Medicine | Admitting: Family Medicine

## 2024-01-23 ENCOUNTER — Encounter: Payer: Self-pay | Admitting: Family Medicine

## 2024-01-23 ENCOUNTER — Telehealth: Payer: Self-pay

## 2024-01-23 ENCOUNTER — Encounter: Payer: Self-pay | Admitting: Physician Assistant

## 2024-01-23 DIAGNOSIS — M549 Dorsalgia, unspecified: Secondary | ICD-10-CM

## 2024-01-23 LAB — FECAL OCCULT BLOOD, IMMUNOCHEMICAL: Fecal Occult Bld: POSITIVE — AB

## 2024-01-23 NOTE — Telephone Encounter (Signed)
 Transition Care Management Follow-up Telephone Call Date of discharge and from where: 01/18/2024 [facility] How have you been since you were released from the hospital? Same Any questions or concerns? Yes  Items Reviewed: Did the pt receive and understand the discharge instructions provided? Yes  Medications obtained and verified? Yes  Other? No  Any new allergies since your discharge? No  Dietary orders reviewed? No Do you have support at home? Yes   Home Care and Equipment/Supplies: Were home health services ordered? not applicable If so, what is the name of the agency?   Has the agency set up a time to come to the patient's home?  Were any new equipment or medical supplies ordered?   What is the name of the medical supply agency?  Were you able to get the supplies/equipment?  Do you have any questions related to the use of the equipment or supplies?   Functional Questionnaire: (I = Independent and D = Dependent) ADLs:   Bathing/Dressing-   Meal Prep-   Eating-   Maintaining continence-   Transferring/Ambulation-   Managing Meds-   Follow up appointments reviewed:  PCP Hospital f/u appt confirmed? Yes  Scheduled to see Garnette Lukes on January 19, 2024 . Specialist Hospital f/u appt confirmed?  Are transportation arrangements needed? No  If their condition worsens, is the pt aware to call PCP or go to the Emergency Dept.? Yes Was the patient provided with contact information for the PCP's office or ED? Yes Was to pt encouraged to call back with questions or concerns? Yes

## 2024-01-23 NOTE — Telephone Encounter (Signed)
 FYI

## 2024-01-26 ENCOUNTER — Ambulatory Visit: Payer: Self-pay | Admitting: Family Medicine

## 2024-01-30 NOTE — Telephone Encounter (Signed)
Noted!  Will wait for lab results.

## 2024-02-09 ENCOUNTER — Encounter: Payer: Self-pay | Admitting: Family Medicine

## 2024-02-09 ENCOUNTER — Other Ambulatory Visit: Payer: Self-pay

## 2024-02-09 ENCOUNTER — Other Ambulatory Visit (INDEPENDENT_AMBULATORY_CARE_PROVIDER_SITE_OTHER)

## 2024-02-09 DIAGNOSIS — D649 Anemia, unspecified: Secondary | ICD-10-CM

## 2024-02-09 DIAGNOSIS — M549 Dorsalgia, unspecified: Secondary | ICD-10-CM

## 2024-02-09 DIAGNOSIS — I739 Peripheral vascular disease, unspecified: Secondary | ICD-10-CM

## 2024-02-09 LAB — COMPREHENSIVE METABOLIC PANEL WITH GFR
ALT: 21 U/L (ref 0–53)
AST: 27 U/L (ref 0–37)
Albumin: 4 g/dL (ref 3.5–5.2)
Alkaline Phosphatase: 62 U/L (ref 39–117)
BUN: 17 mg/dL (ref 6–23)
CO2: 43 meq/L — ABNORMAL HIGH (ref 19–32)
Calcium: 9.7 mg/dL (ref 8.4–10.5)
Chloride: 90 meq/L — ABNORMAL LOW (ref 96–112)
Creatinine, Ser: 0.82 mg/dL (ref 0.40–1.50)
GFR: 85.39 mL/min (ref >60.00)
Glucose, Bld: 109 mg/dL — ABNORMAL HIGH (ref 70–99)
Potassium: 4.3 meq/L (ref 3.5–5.1)
Sodium: 142 meq/L (ref 135–145)
Total Bilirubin: 0.5 mg/dL (ref 0.2–1.2)
Total Protein: 7 g/dL (ref 6.0–8.3)

## 2024-02-09 LAB — CBC WITH DIFFERENTIAL/PLATELET
Basophils Absolute: 0 K/uL (ref 0.0–0.1)
Basophils Relative: 0.1 % (ref 0.0–3.0)
Eosinophils Absolute: 0 K/uL (ref 0.0–0.7)
Eosinophils Relative: 0.4 % (ref 0.0–5.0)
HCT: 35.3 % — ABNORMAL LOW (ref 39.0–52.0)
Hemoglobin: 11.3 g/dL — ABNORMAL LOW (ref 13.0–17.0)
Lymphocytes Relative: 4.9 % — ABNORMAL LOW (ref 12.0–46.0)
Lymphs Abs: 0.4 K/uL — ABNORMAL LOW (ref 0.7–4.0)
MCHC: 32.1 g/dL (ref 30.0–36.0)
MCV: 97.4 fl (ref 78.0–100.0)
Monocytes Absolute: 0.5 K/uL (ref 0.1–1.0)
Monocytes Relative: 5.8 % (ref 3.0–12.0)
Neutro Abs: 7.8 K/uL — ABNORMAL HIGH (ref 1.4–7.7)
Neutrophils Relative %: 88.8 % — ABNORMAL HIGH (ref 43.0–77.0)
Platelets: 172 K/uL (ref 150.0–400.0)
RBC: 3.62 Mil/uL — ABNORMAL LOW (ref 4.22–5.81)
RDW: 14.4 % (ref 11.5–15.5)
WBC: 8.8 K/uL (ref 4.0–10.5)

## 2024-02-09 LAB — IBC + FERRITIN
Ferritin: 78.4 ng/mL (ref 22.0–322.0)
Iron: 36 ug/dL — ABNORMAL LOW (ref 42–165)
Saturation Ratios: 8.9 % — ABNORMAL LOW (ref 20.0–50.0)
TIBC: 404.6 ug/dL (ref 250.0–450.0)
Transferrin: 289 mg/dL (ref 212.0–360.0)

## 2024-02-20 ENCOUNTER — Encounter (HOSPITAL_COMMUNITY)

## 2024-02-22 ENCOUNTER — Encounter: Payer: Self-pay | Admitting: Internal Medicine

## 2024-02-23 ENCOUNTER — Ambulatory Visit: Payer: Self-pay | Admitting: Internal Medicine

## 2024-02-23 NOTE — Telephone Encounter (Signed)
 FYI Only or Action Required?: Action required by provider: Medication request.  Patient is followed in Pulmonology for COPD, last seen on 01/14/2024 by Geronimo Amel, MD.  Called Nurse Triage reporting Cough.  Symptoms began a week ago.  Interventions attempted: Prescription medications: Mucinex  and Nebulizer treatments.  Symptoms are: gradually worsening.  Triage Disposition: See HCP Within 4 Hours (Or PCP Triage)  Patient/caregiver understands and will follow disposition?: No, wishes to speak with PCP          Copied from CRM #[id number]. Topic: Clinical - Red Word Triage >> Feb 23, 2024 10:56 AM Leila C wrote: Red Word that prompted transfer to Nurse Triage: Patient's spouse Rogers [phone] states sent a message that patient is having a COPD flare up, coughing yellowish brown phlegm, shortness of breath is more labored, wheezing the same amount, and having hot and cold flashes- no fever. Patient does not have dizziness, pain nor fever. Patient is unable to come into the office, hard time moving, needs wheelchair, but can do virtual visit with Dr. Geronimo. Rogers is asking for Prednisone  and antiobiotics. Please advise.   [id number] [place], [place] - [id number] [place]  [address] Phone: [phone] Fax: [phone]          Reason for Disposition  Wheezing is present  Answer Assessment - Initial Assessment Questions Patient's wife reports that Dr. Geronimo has called in prescriptions for antibiotics and a steroid in the past and would like that done today if possible. Patient unable to come into the office for an appointment but is willing to do a virtual appointment if needed. Please advise.           1. ONSET: When did the cough begin?      About a week ago  2. SEVERITY: How bad is the cough today?      Moderate  3. SPUTUM: Describe the color of your sputum (e.g., none, dry cough; clear,  white, yellow, green)     Green/Yellow  4. HEMOPTYSIS: Are you coughing up any blood? If Yes, ask: How much? (e.g., flecks, streaks, tablespoons, etc.)     No 5. DIFFICULTY BREATHING: Are you having difficulty breathing? If Yes, ask: How bad is it? (e.g., mild, moderate, severe)      Yes, moderate  6. FEVER: Do you have a fever? If Yes, ask: What is your temperature, how was it measured, and when did it start?     No 7. CARDIAC HISTORY: Do you have any history of heart disease? (e.g., heart attack, congestive heart failure)      Yes 8. LUNG HISTORY: Do you have any history of lung disease?  (e.g., pulmonary embolus, asthma, emphysema)     COPD 9. PE RISK FACTORS: Do you have a history of blood clots? (or: recent major surgery, recent prolonged travel, bedridden)     Yes 10. OTHER SYMPTOMS: Do you have any other symptoms? (e.g., runny nose, wheezing, chest pain)       Shortness of breath, chills/hot flashes  Protocols used: Cough - Acute Productive-A-AH

## 2024-02-23 NOTE — Telephone Encounter (Signed)
 Duplicate please see My chart message

## 2024-02-24 ENCOUNTER — Encounter (HOSPITAL_COMMUNITY)

## 2024-02-26 ENCOUNTER — Ambulatory Visit: Payer: Self-pay

## 2024-02-26 ENCOUNTER — Ambulatory Visit: Payer: Self-pay | Admitting: Internal Medicine

## 2024-02-26 ENCOUNTER — Telehealth: Payer: Self-pay

## 2024-02-26 ENCOUNTER — Other Ambulatory Visit: Payer: Self-pay | Admitting: Internal Medicine

## 2024-02-26 DIAGNOSIS — J441 Chronic obstructive pulmonary disease with (acute) exacerbation: Secondary | ICD-10-CM

## 2024-02-26 MED ORDER — PREDNISONE 10 MG PO TABS
ORAL_TABLET | ORAL | 6 refills | Status: DC
Start: 2024-02-26 — End: 2024-03-04

## 2024-02-26 MED ORDER — SULFAMETHOXAZOLE-TRIMETHOPRIM 800-160 MG PO TABS: 1.0000 | ORAL_TABLET | Freq: Two times a day (BID) | ORAL | 0 refills | Status: DC

## 2024-02-26 MED ORDER — PREDNISONE 10 MG PO TABS: 10.0000 mg | ORAL_TABLET | Freq: Every day | ORAL | 3 refills | Status: DC

## 2024-02-26 NOTE — Telephone Encounter (Signed)
 Copied from CRM #[id number]. Topic: Clinical - Medical Advice >> Feb 26, 2024 12:57 PM Isabell A wrote: Reason for CRM: Patient returning a phone call from [facility] nurse - states he received a VM with no name, contacted [facility] - send CRM.  Callback number: [phone]  Handling via patient calls

## 2024-02-26 NOTE — Telephone Encounter (Signed)
 I want you try bactrim  this time for 5 days - 1 DS BID x 5 days - if causing side effect lmk - during this time hold other antibiotics  Also  Take prednisone  50mg  daily x 2 days,  40 mg daily x 2 days, then 30mg  daily x 3  20mg  daily x 2 days, then 10mg  daily continue      Allergies  Allergen Reactions   Tape Other (See Comments)    Skin tears easily   Augmentin  [Amoxicillin -Pot Clavulanate] Other (See Comments)    Thrush Sore throat Hoarse voice   Ciprofloxacin  Other (See Comments)    hallucinations   Gadavist  [Gadobutrol ] Other (See Comments)    Unknown reaction   Gadolinium Itching, Rash and Other (See Comments)    Itchy back rash   Lipitor [Atorvastatin ] Rash    Current Outpatient Medications:    acetaminophen  (TYLENOL ) 500 MG tablet, Take 500 mg by mouth every 4 (four) hours as needed for moderate pain, fever, headache or mild pain., Disp: , Rfl:    albuterol  (VENTOLIN  HFA) 108 (90 Base) MCG/ACT inhaler, Inhale 2 puffs into the lungs every 6 (six) hours as needed for wheezing or shortness of breath., Disp: , Rfl:    azithromycin  (ZITHROMAX ) 250 MG tablet, TAKE 1 TABLET BY MOUTH 3 TIMES  WEEKLY, Disp: 39 tablet, Rfl: 3   Calcium  Carb-Cholecalciferol  (CALCIUM  + D3 PO), Take 1 tablet by mouth daily with breakfast., Disp: , Rfl:    docusate sodium  (COLACE) 100 MG capsule, Take 2 capsules (200 mg total) by mouth 2 (two) times daily., Disp: 10 capsule, Rfl: 0   doxycycline  (VIBRA -TABS) 100 MG tablet, Take 1 tablet (100 mg total) by mouth 2 (two) times daily., Disp: 14 tablet, Rfl: 0   ELIQUIS  2.5 MG TABS tablet, TAKE 1 TABLET BY MOUTH TWICE  DAILY (Patient not taking: Reported on 01/19/2024), Disp: 180 tablet, Rfl: 3   Ensifentrine  (OHTUVAYRE ) 3 MG/2.5ML SUSP, INHALE THE CONTENTS OF 1 AMPULE VIA NEBULIZER IN THE MORNING AND IN THE EVENING, Disp: 450 mL, Rfl: 1   Ferrous Sulfate  (IRON ) 325 (65 Fe) MG TABS, Take 325 mg by mouth See admin instructions. Take 325 mg by mouth with  food every other day, Disp: , Rfl:    fluticasone  (FLONASE ) 50 MCG/ACT nasal spray, USE 2 SPRAYS IN BOTH NOSTRILS  DAILY, Disp: 48 g, Rfl: 3   folic acid  (FOLVITE ) 1 MG tablet, Take 1 tablet (1 mg total) by mouth daily., Disp: , Rfl:    furosemide  (LASIX ) 20 MG tablet, TAKE 1 TABLET BY MOUTH DAILY AS  NEEDED FOR FLUID OR EDEMA, Disp: 90 tablet, Rfl: 3   ipratropium-albuterol  (DUONEB) 0.5-2.5 (3) MG/3ML SOLN, TAKE 3 MLS BY NEBULIZATION 3 (THREE) TIMES DAILY., Disp: 810 mL, Rfl: 0   Multiple Vitamin (MULTIVITAMIN WITH MINERALS) TABS tablet, Take 1 tablet by mouth daily., Disp: , Rfl:    mupirocin  ointment (BACTROBAN ) 2 %, APPLY TOPICALLY TWICE DAILY FOR  SKIN INFECTIONS, Disp: 60 g, Rfl: 3   OXYGEN, Inhale 4.5 L/min into the lungs continuous., Disp: , Rfl:    pantoprazole  (PROTONIX ) 20 MG tablet, TAKE 1 TABLET BY MOUTH DAILY, Disp: 90 tablet, Rfl: 3   predniSONE  (DELTASONE ) 10 MG tablet, Take 1 tablet (10 mg total) by mouth daily with breakfast., Disp: 90 tablet, Rfl: 3   rosuvastatin  (CRESTOR ) 40 MG tablet, Take 1 tablet (40 mg total) by mouth daily., Disp: 90 tablet, Rfl: 3   SPIRIVA  RESPIMAT 2.5 MCG/ACT AERS, USE  2 INHALATIONS BY MOUTH ONCE  DAILY, Disp: 12 g, Rfl: 3   SYMBICORT  160-4.5 MCG/ACT inhaler, INHALE 2 INHALATIONS BY MOUTH  TWICE DAILY, Disp: 30.6 g, Rfl: 3   SYSTANE PRESERVATIVE FREE 0.4-0.3 % SOLN, Place 1 drop into both eyes 2 (two) times daily., Disp: , Rfl:    traMADol  (ULTRAM ) 50 MG tablet, Take 50 mg by mouth every 12 (twelve) hours as needed for severe pain., Disp: , Rfl:

## 2024-02-26 NOTE — Telephone Encounter (Signed)
 I called and spoke to Francisco [name]. Francisco [name] states he is having a COPD flare up. A lot of congestion with a green and yellow color mucous. Francisco [name] states the cough and congestion has went on for a week- 1.5 weeks. Francisco [name] would like an antibiotic called in. [facility] [place] Please advise.

## 2024-02-26 NOTE — Telephone Encounter (Signed)
 Please see alternate NT note below.   Reason for Disposition  [1] Caller demands to speak with the PCP AND [2] about sick adult (or sick caller)  Answer Assessment - Initial Assessment Questions 1. REASON FOR CALL: What is the main reason for your call? or How can I best help you?     Pt reports he was disconnected with a male and was in the process of scheduling a virtual appt 2. SYMPTOMS : Do you have any symptoms?      COPD flare 3. OTHER QUESTIONS: Do you have any other questions?     N/a    Triager spoke to [employer] at [facility] to see if someone in office was attempting to contact pt re: scheduling virtual visit as there was no one from [facility] that was . Lucyann unable to identify person previously speaking to pt. Triager than spoke to clinical member, Ashlyn and she reinforced that pt's request has been escalated to Dr. Geronimo and awaiting MD response.  Triager reinforced previous NT disposition to ED. Pt respectfully refused disposition. Triager will forward encounter for Dr. Geronimo 's office to review and advise. Patient verbalized understanding and is expecting call back from office for next steps, if any. Triager also advised that if pt does not hear back from office, to follow disposition for further evaluation/treatment.  Protocols used: Information Only Call - No Triage-A-AH, Difficult Call-A-AH

## 2024-02-26 NOTE — Telephone Encounter (Signed)
 FYI Only or Action Required?: Action required by provider: medication refill request and clinical question for provider.  Patient is followed in Pulmonology for COPD, last seen on 01/14/2024 by Geronimo Amel, MD.  Called Nurse Triage reporting No chief complaint on file..  Symptoms began several days ago.  Interventions attempted: Rescue inhaler, Maintenance inhaler, and Nebulizer treatments.  Symptoms are: gradually worsening.

## 2024-02-26 NOTE — Telephone Encounter (Addendum)
 FYI Only or Action Required?: Action required by provider: medication refill request.  Patient is followed in Pulmonology for COPD, last seen on 01/14/2024 by Geronimo Amel, MD.   Triage call received on @TODAY @ Patient/caregiver reports concerns of Follow up on previous issue  Assessment conducted based on subjective report and clinical judgment within nursing scope.  Patient advised based on reported symptoms, risk factors, and urgency of presentation.   Represenative of [facility] will reach out to patient.

## 2024-02-26 NOTE — Telephone Encounter (Signed)
 A high priority message and a secure chat was already sent to Dr Geronimo. Completing this not as we do not need duplicates.

## 2024-02-26 NOTE — Telephone Encounter (Unsigned)
 Copied from CRM [id number]. Topic: Clinical - Medication Refill >> Feb 26, 2024 10:14 AM Whitney O wrote: Medication: doxycycline  (VIBRA -TABS) 100 MG tablet predniSONE  (DELTASONE ) 10 MG tablet  Has the patient contacted their pharmacy? No he says he has to contact the doctor  (Agent: If no, request that the patient contact the pharmacy for the refill. If patient does not wish to contact the pharmacy document the reason why and proceed with request.) (Agent: If yes, when and what did the pharmacy advise?)  This is the patient's preferred pharmacy:  [id number] [place], [place] - [address] [place] [address] Phone: [phone] Fax: [phone]  [facility] [place] [place] [facility] [address] [place] [address] Phone: [phone] Fax: [phone]  [facility] - [place], [place] - [address] [place] [address] Phone: [phone] Fax: [phone]  Is this the correct pharmacy for this prescription? Yes If no, delete pharmacy and type the correct one.  [id number] [place],  - [address] [place]  [address] Phone: [phone] Fax: [phone]   Has the prescription been filled recently? No  Is the patient out of the medication? Yes  Has the patient been seen for an appointment in the last year OR does the patient have an upcoming appointment? Yes  Can we respond through MyChart? Yes  Agent: Please be advised that Rx refills may take up to 3 business days. We ask that you follow-up with your pharmacy.

## 2024-02-26 NOTE — Telephone Encounter (Addendum)
 FYI Only or Action Required?: Action required by provider: Refusing disposition, requesting call back to pt [phone], requesting meds, pt would be okay with virtual visit if Dr. Geronimo feels appropriate.  Patient is followed in Pulmonology for COPD and lung cancer, last seen on 01/14/2024 by Francisco Amel, MD.  Called Nurse Triage reporting Shortness of Breath, Weakness, coughing fits, green/yellow sputum, and low oxygen levels.  Symptoms began a week ago.  Interventions attempted: Nebulizer treatments, Home oxygen use, Increased fluids/rest, and Other: used rescue inhaler per nurse request.  Symptoms are: ebbs and flows to severe.  Triage Disposition: Go to ED Now (Notify PCP)  Patient/caregiver understands and will follow disposition?: No, refuses disposition     Copied from CRM [id number]. Topic: Clinical - Red Word Triage >> Feb 26, 2024 10:20 AM [name] wrote: [facility] that prompted transfer to Nurse Triage: shortness of breath copd flare up Reason for Disposition  Nurse judgment  [1] MODERATE difficulty breathing (e.g., speaks in phrases, SOB even at rest, pulse 100-120) AND [2] NEW-onset or WORSE than normal  Answer Assessment - Initial Assessment Questions E2C2 Pulmonary Triage - Initial Assessment Questions Chief Complaint (e.g., cough, sob, wheezing, fever, chills, sweat or additional symptoms) *Go to specific symptom protocol after initial questions. More SOB than usual both with activity and at rest, even sitting hard to breath right now No chest pain, dizziness Weak yeah, really can't even walk ma'am as his baseline, confirms feeling weaker than his baseline Coughing fits, coughing up green and yellow  How long have symptoms been present? A week  Have you used your inhalers/maintenance medication? Yes If yes, What medications? Using nebulizer but other one not the rescue one, didn't really use an albuterol  yet, usually doesn't break anything up,  been months since used it  OXYGEN: Do you wear supplemental oxygen? Yes If yes, How many liters are you supposed to use? 5.5L continuous  Do you monitor your oxygen levels? Yes If yes, What is your reading (oxygen level) today? If get up and move, gets down in low 80s Otherwise about 90-something Taking now 97%  What is your usual oxygen saturation reading?  (Note: Pulmonary O2 sats should be 90% or greater) 97% if sitting not doing anything on 5.5L   6. CARDIAC HISTORY: Do you have any history of heart disease? (e.g., heart attack, angina, bypass surgery, angioplasty)      significant  7. LUNG HISTORY: Do you have any history of lung disease?  (e.g., pulmonary embolus, asthma, emphysema)     significant  9. OTHER SYMPTOMS: Do you have any other symptoms? (e.g., chest pain, cough, dizziness, fever, runny nose)     No fever, runny nose    Usually prescribes antibx and prednisone  Not wanting to be examined I know what it is, have stage 4 COPD, not out of the norm     Pt accepts nurse offer of nurse calling back in 30 min to ensure feeling better after albuterol  treatment Sending message to pulm with pt request for antibx and prednisone     Urgent Home Treatment with Follow Up  Called pt back at 10:55 am, pt confirms he used his albuterol  inhaler and is feeling some improvement, still SOB with both rest and activity congestion ain't quite as bad right now, had to use suction device to get rid of some of it If Dr. Geronimo wants to do a virtual visit that'd be fine   Refusing in-person appt at this time, sending message to pulm  for call back to pt [phone], requesting meds, would be okay with virtual visit if Dr. Geronimo feels appropriate, advised call back if worsening symptoms, advised continue use of albuterol  every 6 hours as needed, advised go to hospital if no improvement with inhaler or having new/worsening symptoms  Protocols used: Breathing  Difficulty-A-AH, COPD Oxygen Monitoring and Hypoxia-A-AH   Pt confirmed he did not want to be examined, requesting meds, willing to do virtual appt with Dr. Geronimo

## 2024-02-27 MED ORDER — SULFAMETHOXAZOLE-TRIMETHOPRIM 800-160 MG PO TABS
1.0000 | ORAL_TABLET | Freq: Two times a day (BID) | ORAL | 0 refills | Status: AC
Start: 2024-02-27 — End: 2024-03-03

## 2024-02-27 NOTE — Telephone Encounter (Signed)
 Can someone ensure bactrim  reaches him please

## 2024-02-27 NOTE — Telephone Encounter (Signed)
 Spoke with patient's wife Rogers regarding prior message. Rogers stated she had spoke [facility] regarding medication  that he was going he is going to send to patient's pharmacy. Patient was able to pick up script for prednisone  and pharmacy stated no script for Bactrim . I have resent in a new script for Bactrim  to patient's pharmacy and let lillie know. Lillie's voice was understanding.Nothing else further needed.

## 2024-03-01 ENCOUNTER — Ambulatory Visit: Payer: Self-pay | Admitting: Internal Medicine

## 2024-03-01 MED ORDER — DOXYCYCLINE HYCLATE 100 MG PO TABS
100.0000 mg | ORAL_TABLET | Freq: Two times a day (BID) | ORAL | 0 refills | Status: DC
Start: 2024-03-01 — End: 2024-03-08

## 2024-03-01 NOTE — Telephone Encounter (Signed)
 Called and spoke with the patients wife (DPR). Pt states he has been taking Bactrim  for 2 days and notices muscle aches, fatigue ness, shaky hands & body, loss of appetite, headache and body weakness. Pt woke up this morning and needed wife's assistance to get out of the bed.  Dr. Geronimo please advise, pt thinks these side effects are from taking Bactrim .

## 2024-03-01 NOTE — Telephone Encounter (Signed)
 Called and spoke with the patients wife (DPR) Pt has stopped taking Bactrim . Sending Doxy to pt preferred pharmacy.  Pts wife verbalized understanding & had no other concerns. Nfn

## 2024-03-01 NOTE — Telephone Encounter (Signed)
 Please advise on ABX change

## 2024-03-01 NOTE — Telephone Encounter (Signed)
 FYI Only or Action Required?: Action required by provider: update on patient condition.  Patient is followed in Pulmonology for COPD, last seen on 01/14/2024 by Geronimo Amel, MD.  Called Nurse Triage reporting Medication Problem.  Symptoms began several days ago.  Interventions attempted: Prescription medications: predniSONE  (DELTASONE ) 10 MG tablet & sulfamethoxazole -trimethoprim .  Symptoms are: unchanged.  Triage Disposition: Call PCP When Office is Open  Patient/caregiver understands and will follow disposition?: Yes     Copied from CRM [id number]. Topic: Clinical - Medical Advice >> Mar 01, 2024  8:13 AM Francisco [name] DEL wrote: Reason for CRM: pt thinks he is having reaction to a new med Dr Santos put him on it. Reason for Disposition  [1] Caller has NON-URGENT medicine question about med that PCP prescribed AND [2] triager unable to answer question  Answer Assessment - Initial Assessment Questions 1. NAME of MEDICINE: What medicine(s) are you calling about?     sulfamethoxazole -trimethoprim  (BACTRIM  DS) = started 2 days ago Or  predniSONE  (DELTASONE ) 10 MG tablet 2. QUESTION: What is your question? (e.g., double dose of medicine, side effect)     Possible SE 3. PRESCRIBER: Who prescribed the medicine? Reason: if prescribed by specialist, call should be referred to that group.     Dr Geronimo 4. SYMPTOMS: Do you have any symptoms? If Yes, ask: What symptoms are you having?  How bad are the symptoms (e.g., mild, moderate, severe)     L wrist pain since this AM - endorses joint pain and warmth. Denies swelling redness 8/10 with movement 5. PREGNANCY:  Is there any chance that you are pregnant? When was your last menstrual period?     N/a    Triager will forward encounter for Dr. Geronimo 's office to review and advise. Patient verbalized understanding and is expecting call back from office for next steps. Triager also advised that if pt does not hear back from  office, to follow up with PCP.  Answer Assessment - Initial Assessment Questions 1. ONSET: When did the pain start?     2 days 2. LOCATION: Where is the pain located?     See alternate assessment 3. PAIN: How bad is the pain? (Scale 1-10; or mild, moderate, severe)     See alternate 4. WORK OR EXERCISE: Has there been any recent work or exercise that involved this part (i.e., hand or wrist) of the body?     denies 5. CAUSE: What do you think is causing the pain?     Unknown, possible SE 6. AGGRAVATING FACTORS: What makes the pain worse? (e.g., using computer)     movement 7. OTHER SYMPTOMS: Do you have any other symptoms? (e.g., fever, neck pain, numbness or tingling, rash, swelling)     denies 8. PREGNANCY: Is there any chance you are pregnant? When was your last menstrual period?     N/a  Protocols used: Medication Question Call-A-AH, Wrist Pain-A-AH

## 2024-03-01 NOTE — Telephone Encounter (Signed)
 Could be bactrim . Could be COPD  Plan   - stop bactrim  - start    - Take doxycycline  100mg  po twice daily x 5 days; take after meals and avoid sunlight

## 2024-03-02 NOTE — Telephone Encounter (Signed)
 Please see other note. I believe I aleady did

## 2024-03-04 ENCOUNTER — Encounter (HOSPITAL_BASED_OUTPATIENT_CLINIC_OR_DEPARTMENT_OTHER): Payer: Self-pay | Admitting: Emergency Medicine

## 2024-03-04 ENCOUNTER — Other Ambulatory Visit: Payer: Self-pay

## 2024-03-04 ENCOUNTER — Inpatient Hospital Stay (HOSPITAL_BASED_OUTPATIENT_CLINIC_OR_DEPARTMENT_OTHER)
Admission: EM | Admit: 2024-03-04 | Discharge: 2024-03-08 | DRG: 602 | Disposition: A | Discharge status: Home Health | Attending: Internal Medicine | Admitting: Internal Medicine

## 2024-03-04 ENCOUNTER — Emergency Department (HOSPITAL_BASED_OUTPATIENT_CLINIC_OR_DEPARTMENT_OTHER)

## 2024-03-04 DIAGNOSIS — Z888 Allergy status to other drugs, medicaments and biological substances status: Secondary | ICD-10-CM

## 2024-03-04 DIAGNOSIS — K219 Gastro-esophageal reflux disease without esophagitis: Secondary | ICD-10-CM | POA: Diagnosis present

## 2024-03-04 DIAGNOSIS — Z7951 Long term (current) use of inhaled steroids: Secondary | ICD-10-CM

## 2024-03-04 DIAGNOSIS — Z7952 Long term (current) use of systemic steroids: Secondary | ICD-10-CM | POA: Diagnosis not present

## 2024-03-04 DIAGNOSIS — I110 Hypertensive heart disease with heart failure: Secondary | ICD-10-CM | POA: Diagnosis present

## 2024-03-04 DIAGNOSIS — J8410 Pulmonary fibrosis, unspecified: Secondary | ICD-10-CM | POA: Diagnosis present

## 2024-03-04 DIAGNOSIS — J9611 Chronic respiratory failure with hypoxia: Secondary | ICD-10-CM | POA: Diagnosis not present

## 2024-03-04 DIAGNOSIS — Z823 Family history of stroke: Secondary | ICD-10-CM | POA: Diagnosis not present

## 2024-03-04 DIAGNOSIS — I509 Heart failure, unspecified: Secondary | ICD-10-CM | POA: Diagnosis present

## 2024-03-04 DIAGNOSIS — Z8249 Family history of ischemic heart disease and other diseases of the circulatory system: Secondary | ICD-10-CM

## 2024-03-04 DIAGNOSIS — I10 Essential (primary) hypertension: Secondary | ICD-10-CM | POA: Diagnosis not present

## 2024-03-04 DIAGNOSIS — E785 Hyperlipidemia, unspecified: Secondary | ICD-10-CM | POA: Diagnosis present

## 2024-03-04 DIAGNOSIS — J449 Chronic obstructive pulmonary disease, unspecified: Secondary | ICD-10-CM | POA: Diagnosis not present

## 2024-03-04 DIAGNOSIS — J439 Emphysema, unspecified: Secondary | ICD-10-CM | POA: Diagnosis present

## 2024-03-04 DIAGNOSIS — Z87891 Personal history of nicotine dependence: Secondary | ICD-10-CM | POA: Diagnosis not present

## 2024-03-04 DIAGNOSIS — Z85828 Personal history of other malignant neoplasm of skin: Secondary | ICD-10-CM

## 2024-03-04 DIAGNOSIS — E7849 Other hyperlipidemia: Secondary | ICD-10-CM | POA: Diagnosis not present

## 2024-03-04 DIAGNOSIS — Z7901 Long term (current) use of anticoagulants: Secondary | ICD-10-CM | POA: Diagnosis not present

## 2024-03-04 DIAGNOSIS — Z79899 Other long term (current) drug therapy: Secondary | ICD-10-CM

## 2024-03-04 DIAGNOSIS — Z86711 Personal history of pulmonary embolism: Secondary | ICD-10-CM

## 2024-03-04 DIAGNOSIS — Z66 Do not resuscitate: Secondary | ICD-10-CM | POA: Diagnosis present

## 2024-03-04 DIAGNOSIS — J441 Chronic obstructive pulmonary disease with (acute) exacerbation: Secondary | ICD-10-CM | POA: Diagnosis present

## 2024-03-04 DIAGNOSIS — J9621 Acute and chronic respiratory failure with hypoxia: Secondary | ICD-10-CM | POA: Diagnosis present

## 2024-03-04 DIAGNOSIS — Z881 Allergy status to other antibiotic agents status: Secondary | ICD-10-CM

## 2024-03-04 DIAGNOSIS — I739 Peripheral vascular disease, unspecified: Secondary | ICD-10-CM | POA: Diagnosis present

## 2024-03-04 DIAGNOSIS — Z91048 Other nonmedicinal substance allergy status: Secondary | ICD-10-CM

## 2024-03-04 DIAGNOSIS — L03114 Cellulitis of left upper limb: Principal | ICD-10-CM | POA: Diagnosis present

## 2024-03-04 DIAGNOSIS — Z85118 Personal history of other malignant neoplasm of bronchus and lung: Secondary | ICD-10-CM | POA: Diagnosis not present

## 2024-03-04 DIAGNOSIS — Z8679 Personal history of other diseases of the circulatory system: Secondary | ICD-10-CM

## 2024-03-04 DIAGNOSIS — M810 Age-related osteoporosis without current pathological fracture: Secondary | ICD-10-CM | POA: Diagnosis present

## 2024-03-04 DIAGNOSIS — L03113 Cellulitis of right upper limb: Secondary | ICD-10-CM | POA: Diagnosis present

## 2024-03-04 DIAGNOSIS — M7989 Other specified soft tissue disorders: Secondary | ICD-10-CM | POA: Diagnosis present

## 2024-03-04 DIAGNOSIS — Z9981 Dependence on supplemental oxygen: Secondary | ICD-10-CM

## 2024-03-04 DIAGNOSIS — J9622 Acute and chronic respiratory failure with hypercapnia: Secondary | ICD-10-CM | POA: Diagnosis present

## 2024-03-04 DIAGNOSIS — J9612 Chronic respiratory failure with hypercapnia: Secondary | ICD-10-CM | POA: Diagnosis present

## 2024-03-04 LAB — BASIC METABOLIC PANEL WITH GFR
Anion gap: 11 (ref 5–15)
BUN: 16 mg/dL (ref 8–23)
CO2: 36 mmol/L — ABNORMAL HIGH (ref 22–32)
Calcium: 10 mg/dL (ref 8.9–10.3)
Chloride: 92 mmol/L — ABNORMAL LOW (ref 98–111)
Creatinine, Ser: 1.11 mg/dL (ref 0.61–1.24)
GFR, Estimated: >60 mL/min (ref >60)
Glucose, Bld: 102 mg/dL — ABNORMAL HIGH (ref 70–99)
Potassium: 4.9 mmol/L (ref 3.5–5.1)
Sodium: 139 mmol/L (ref 135–145)

## 2024-03-04 LAB — CBC WITH DIFFERENTIAL/PLATELET
Abs Immature Granulocytes: 0.33 K/uL — ABNORMAL HIGH (ref 0.00–0.07)
Basophils Absolute: 0.1 K/uL (ref 0.0–0.1)
Basophils Relative: 0 %
Eosinophils Absolute: 0.2 K/uL (ref 0.0–0.5)
Eosinophils Relative: 1 %
HCT: 36.1 % — ABNORMAL LOW (ref 39.0–52.0)
Hemoglobin: 11.2 g/dL — ABNORMAL LOW (ref 13.0–17.0)
Immature Granulocytes: 2 %
Lymphocytes Relative: 3 %
Lymphs Abs: 0.5 K/uL — ABNORMAL LOW (ref 0.7–4.0)
MCH: 31.5 pg (ref 26.0–34.0)
MCHC: 31 g/dL (ref 30.0–36.0)
MCV: 101.4 fL — ABNORMAL HIGH (ref 80.0–100.0)
Monocytes Absolute: 1 K/uL (ref 0.1–1.0)
Monocytes Relative: 6 %
Neutro Abs: 15.3 K/uL — ABNORMAL HIGH (ref 1.7–7.7)
Neutrophils Relative %: 88 %
Platelets: 176 K/uL (ref 150–400)
RBC: 3.56 MIL/uL — ABNORMAL LOW (ref 4.22–5.81)
RDW: 12.8 % (ref 11.5–15.5)
WBC: 17.4 K/uL — ABNORMAL HIGH (ref 4.0–10.5)
nRBC: 0 % (ref 0.0–0.2)

## 2024-03-04 LAB — BLOOD GAS, VENOUS
Acid-Base Excess: 17.1 mmol/L — ABNORMAL HIGH (ref 0.0–2.0)
Bicarbonate: 45.7 mmol/L — ABNORMAL HIGH (ref 20.0–28.0)
O2 Saturation: 93.4 %
Patient temperature: 37
pCO2, Ven: 79 mmHg (ref 44–60)
pH, Ven: 7.37 (ref 7.25–7.43)
pO2, Ven: 63 mmHg — ABNORMAL HIGH (ref 32–45)

## 2024-03-04 MED ORDER — APIXABAN 2.5 MG PO TABS
2.5000 mg | ORAL_TABLET | Freq: Two times a day (BID) | ORAL | Status: DC
  Administered 2024-03-04 – 2024-03-08 (×8): 2.5 mg via ORAL
  Filled 2024-03-04 (×8): qty 1

## 2024-03-04 MED ORDER — DOCUSATE SODIUM 100 MG PO CAPS
200.0000 mg | ORAL_CAPSULE | Freq: Two times a day (BID) | ORAL | Status: DC
  Administered 2024-03-04 – 2024-03-08 (×8): 200 mg via ORAL
  Filled 2024-03-04 (×8): qty 2

## 2024-03-04 MED ORDER — ROSUVASTATIN CALCIUM 20 MG PO TABS
40.0000 mg | ORAL_TABLET | Freq: Every day | ORAL | Status: DC
  Administered 2024-03-05 – 2024-03-08 (×4): 40 mg via ORAL
  Filled 2024-03-04 (×4): qty 2

## 2024-03-04 MED ORDER — METOPROLOL TARTRATE 5 MG/5ML IV SOLN: 5.0000 mg | INTRAVENOUS | Status: DC | PRN

## 2024-03-04 MED ORDER — IPRATROPIUM-ALBUTEROL 0.5-2.5 (3) MG/3ML IN SOLN: 3.0000 mL | Freq: Three times a day (TID) | RESPIRATORY_TRACT | Status: DC

## 2024-03-04 MED ORDER — METHYLPREDNISOLONE SODIUM SUCC 40 MG IJ SOLR
40.0000 mg | Freq: Two times a day (BID) | INTRAMUSCULAR | Status: DC
  Administered 2024-03-05 – 2024-03-08 (×7): 40 mg via INTRAVENOUS
  Filled 2024-03-04 (×7): qty 1

## 2024-03-04 MED ORDER — FLUTICASONE FUROATE-VILANTEROL 200-25 MCG/ACT IN AEPB
1.0000 | INHALATION_SPRAY | Freq: Every day | RESPIRATORY_TRACT | Status: DC
  Filled 2024-03-04: qty 28

## 2024-03-04 MED ORDER — FERROUS SULFATE 325 (65 FE) MG PO TABS
325.0000 mg | ORAL_TABLET | ORAL | Status: DC
Start: 2024-03-05 — End: 2024-03-08
  Administered 2024-03-05 – 2024-03-07 (×2): 325 mg via ORAL
  Filled 2024-03-04 (×4): qty 1

## 2024-03-04 MED ORDER — IPRATROPIUM-ALBUTEROL 0.5-2.5 (3) MG/3ML IN SOLN
3.0000 mL | Freq: Three times a day (TID) | RESPIRATORY_TRACT | Status: DC
  Administered 2024-03-06 – 2024-03-08 (×7): 3 mL via RESPIRATORY_TRACT
  Filled 2024-03-04 (×8): qty 3

## 2024-03-04 MED ORDER — ALBUTEROL SULFATE (2.5 MG/3ML) 0.083% IN NEBU
INHALATION_SOLUTION | RESPIRATORY_TRACT | Status: AC
  Administered 2024-03-04: 10 mg
  Filled 2024-03-04: qty 12

## 2024-03-04 MED ORDER — SODIUM CHLORIDE 0.9% FLUSH: 3.0000 mL | INTRAVENOUS | Status: DC | PRN

## 2024-03-04 MED ORDER — GUAIFENESIN ER 600 MG PO TB12
600.0000 mg | ORAL_TABLET | Freq: Two times a day (BID) | ORAL | Status: DC
  Administered 2024-03-04 – 2024-03-08 (×8): 600 mg via ORAL
  Filled 2024-03-04 (×8): qty 1

## 2024-03-04 MED ORDER — SODIUM CHLORIDE 0.9 % IV SOLN: 250.0000 mL | INTRAVENOUS | Status: AC | PRN

## 2024-03-04 MED ORDER — FOLIC ACID 1 MG PO TABS
1.0000 mg | ORAL_TABLET | Freq: Every day | ORAL | Status: DC
  Administered 2024-03-04 – 2024-03-08 (×5): 1 mg via ORAL
  Filled 2024-03-04 (×5): qty 1

## 2024-03-04 MED ORDER — ONDANSETRON HCL 4 MG PO TABS: 4.0000 mg | ORAL_TABLET | Freq: Four times a day (QID) | ORAL | Status: DC | PRN

## 2024-03-04 MED ORDER — VANCOMYCIN HCL IN DEXTROSE 1-5 GM/200ML-% IV SOLN
1000.0000 mg | Freq: Once | INTRAVENOUS | Status: AC
  Administered 2024-03-04: 1000 mg via INTRAVENOUS
  Filled 2024-03-04: qty 200

## 2024-03-04 MED ORDER — TRAMADOL HCL 50 MG PO TABS
50.0000 mg | ORAL_TABLET | Freq: Once | ORAL | Status: AC
  Administered 2024-03-04: 50 mg via ORAL
  Filled 2024-03-04: qty 1

## 2024-03-04 MED ORDER — SODIUM CHLORIDE 0.9 % IV SOLN
2.0000 g | INTRAVENOUS | Status: DC
  Administered 2024-03-04 – 2024-03-07 (×4): 2 g via INTRAVENOUS
  Filled 2024-03-04 (×4): qty 20

## 2024-03-04 MED ORDER — ENSURE PLUS HIGH PROTEIN PO LIQD: 237.0000 mL | Freq: Two times a day (BID) | ORAL | Status: DC

## 2024-03-04 MED ORDER — PANTOPRAZOLE SODIUM 20 MG PO TBEC
20.0000 mg | DELAYED_RELEASE_TABLET | Freq: Every day | ORAL | Status: DC
  Administered 2024-03-05 – 2024-03-08 (×4): 20 mg via ORAL
  Filled 2024-03-04 (×4): qty 1

## 2024-03-04 MED ORDER — ACETAMINOPHEN 650 MG RE SUPP: 650.0000 mg | Freq: Four times a day (QID) | RECTAL | Status: DC | PRN

## 2024-03-04 MED ORDER — IPRATROPIUM-ALBUTEROL 0.5-2.5 (3) MG/3ML IN SOLN: 3.0000 mL | Freq: Four times a day (QID) | RESPIRATORY_TRACT | Status: DC | PRN

## 2024-03-04 MED ORDER — GUAIFENESIN ER 600 MG PO TB12: 600.0000 mg | ORAL_TABLET | Freq: Two times a day (BID) | ORAL | Status: DC | PRN

## 2024-03-04 MED ORDER — ALBUTEROL (5 MG/ML) CONTINUOUS INHALATION SOLN
10.0000 mg/h | INHALATION_SOLUTION | Freq: Once | RESPIRATORY_TRACT | Status: DC
Start: 2024-03-04 — End: 2024-03-04

## 2024-03-04 MED ORDER — TIOTROPIUM BROMIDE MONOHYDRATE 2.5 MCG/ACT IN AERS: 2.5000 ug | INHALATION_SPRAY | Freq: Two times a day (BID) | RESPIRATORY_TRACT | Status: DC

## 2024-03-04 MED ORDER — ACETAMINOPHEN 325 MG PO TABS
650.0000 mg | ORAL_TABLET | Freq: Four times a day (QID) | ORAL | Status: DC | PRN
  Administered 2024-03-07: 650 mg via ORAL
  Filled 2024-03-04: qty 2

## 2024-03-04 MED ORDER — VANCOMYCIN HCL 1250 MG/250ML IV SOLN
1250.0000 mg | INTRAVENOUS | Status: DC
  Filled 2024-03-04: qty 250

## 2024-03-04 MED ORDER — ONDANSETRON HCL 4 MG/2ML IJ SOLN: 4.0000 mg | Freq: Four times a day (QID) | INTRAMUSCULAR | Status: DC | PRN

## 2024-03-04 MED ORDER — FLUTICASONE PROPIONATE 50 MCG/ACT NA SUSP
1.0000 | Freq: Every day | NASAL | Status: DC
  Administered 2024-03-05 – 2024-03-08 (×4): 1 via NASAL
  Filled 2024-03-04: qty 16

## 2024-03-04 MED ORDER — ENSIFENTRINE 3 MG/2.5ML IN SUSP
3.0000 mg | Freq: Two times a day (BID) | RESPIRATORY_TRACT | Status: DC
  Administered 2024-03-05 – 2024-03-08 (×5): 3 mg via RESPIRATORY_TRACT

## 2024-03-04 MED ORDER — FUROSEMIDE 20 MG PO TABS
20.0000 mg | ORAL_TABLET | Freq: Every day | ORAL | Status: DC
  Administered 2024-03-05 – 2024-03-08 (×4): 20 mg via ORAL
  Filled 2024-03-04 (×4): qty 1

## 2024-03-04 MED ORDER — SODIUM CHLORIDE 0.9% FLUSH
3.0000 mL | Freq: Two times a day (BID) | INTRAVENOUS | Status: DC
  Administered 2024-03-04 – 2024-03-08 (×8): 3 mL via INTRAVENOUS

## 2024-03-04 MED ORDER — UMECLIDINIUM BROMIDE 62.5 MCG/ACT IN AEPB
1.0000 | INHALATION_SPRAY | Freq: Every day | RESPIRATORY_TRACT | Status: DC
  Filled 2024-03-04: qty 7

## 2024-03-04 MED ORDER — METHYLPREDNISOLONE SODIUM SUCC 125 MG IJ SOLR
125.0000 mg | Freq: Once | INTRAMUSCULAR | Status: AC
  Administered 2024-03-04: 125 mg via INTRAVENOUS
  Filled 2024-03-04: qty 2

## 2024-03-04 MED ORDER — VANCOMYCIN HCL 500 MG IV SOLR
500.0000 mg | Freq: Once | INTRAVENOUS | Status: AC
  Administered 2024-03-04: 500 mg via INTRAVENOUS

## 2024-03-04 MED ORDER — PREDNISONE 10 MG PO TABS: 10.0000 mg | ORAL_TABLET | Freq: Every day | ORAL | Status: DC

## 2024-03-04 NOTE — ED Notes (Signed)
 Called Kiana at [facility] for transport

## 2024-03-04 NOTE — Progress Notes (Signed)
 ED Pharmacy Antibiotic Sign Off An antibiotic consult was received from an ED provider for vancomycin  per pharmacy dosing for cellulitis. A chart review was completed to assess appropriateness.   The following one time order(s) were placed:  Vancomycin  1500 mg Iv x 1  Further antibiotic and/or antibiotic pharmacy consults should be ordered by the admitting provider if indicated.   Thank you for allowing pharmacy to be a part of this patient's care.   Dorn Poot, [facility]  Clinical Pharmacist 03/04/24 4:59 PM

## 2024-03-04 NOTE — ED Notes (Signed)
 Provider and RT aware of O2/HR.SABRA

## 2024-03-04 NOTE — ED Notes (Signed)
 Report given to [facility].

## 2024-03-04 NOTE — Progress Notes (Signed)
 Pharmacy Antibiotic Note  Francisco [name] is a 78 y.o. male admitted on 03/04/2024 with cellulitis.  Pharmacy has been consulted for vancomycin  dosing.  Plan: Vancomycin  1500 mg IV x1 given in ED then vancomycin  1250 mg IV q24h ( AUC 492, SCr 1.11 mg/dl, wt 30.3 kg)  Ceftriaxone  2 gr IV q24h per MD   Height: 6' (182.9 cm) Weight: 69.6 kg (153 lb 7 oz) IBW/kg (Calculated) : 77.6  Temp (24hrs), Avg:97.6 F (36.4 C), Min:97.2 F (36.2 C), Max:98 F (36.7 C)  Recent Labs  Lab 03/04/24 1444 03/04/24 1527  WBC  --  17.4*  CREATININE 1.11  --     Estimated Creatinine Clearance: 55.7 mL/min (by C-G formula based on SCr of 1.11 mg/dL).    Allergies  Allergen Reactions   Tape Other (See Comments)    Skin tears easily   Augmentin  [Amoxicillin -Pot Clavulanate] Other (See Comments)    Thrush Sore throat Hoarse voice   Bactrim  [Sulfamethoxazole -Trimethoprim ] Nausea Only    Also feels weak   Ciprofloxacin  Other (See Comments)    hallucinations   Gadavist  [Gadobutrol ] Other (See Comments)    Unknown reaction   Gadolinium Itching, Rash and Other (See Comments)    Itchy back rash   Lipitor [Atorvastatin ] Rash    Antimicrobials this admission: 8/28 vancomycin  >>  8/28 ceftriaxone  >>   Dose adjustments this admission:   Microbiology results: 8/28 BCx:      Dolphus Roller, PharmD, BCPS 03/04/2024 9:09 PM

## 2024-03-04 NOTE — ED Notes (Signed)
 Purple man green.

## 2024-03-04 NOTE — H&P (Signed)
 History and Physical    Francisco [name] FMW:[id number] DOB: Oct 17, 1945 DOA: 03/04/2024  PCP: [name], MD   Patient coming from: Home   Chief Complaint:  Chief Complaint  Patient presents with   Hand Pain   Shortness of Breath   ED TRIAGE note:Pt via pov from home with left hand pain (had surgery for skin cancer on 8/4) and increasing sob (has end-stage copd) in last couple of days. Pt had bactrim  prescribed 8/23 but was unable to tolerate it and quit after 2 days. He was changed to doxycyline on 8/26. Pt a&o x 4; unable to assist much with moving due to sob and hand pain.   HPI:  Francisco [name] is a 78 y.o. male with medical history significant  of stage IV COPD, chronic hypoxic respiratory failure on 5 L of oxygen at baseline, lung cancer, chronic sinus tachycardia, peripheral artery disease, hyperlipidemia, hypertension and PE who presents the emergency department at [facility] with complaint of left hand pain, swelling, erythema that has been going on last several days.  Patient recently had Mohs surgery for his skin cancer on the dorsal aspect of the left hand about a month ago by his dermatologist. On presentation, he was in sinus tachycardia.  As per the report, he has chronic sinus tachycardia.  On 5 L of oxygen for COPD. He was recently started on Bactrim  and steroid for his COPD exacerbation.  Despite taking antibiotics, hand was still painful and swollen. On presentation, he was afebrile, blood pressure was stable.  Lab work showed WC count of 17.4.   X-ray of the left hand showed diffuse soft tissue swelling.   Chest x-ray showed chronic fibrosis of the lung.  No focal consolidation.  In the ED patient has been treated with methylprednisolone , vancomycin   Patient being admitted for the management of right hand cellulitis.  Due to his chronic hypoxia chronic tachycardia patient has been transferred to [facility] long hospital at progressive unit for further management.      Significant labs in the ED: Lab Orders         Culture, blood (Routine X 2) w Reflex to ID Panel         CBC with Differential         Basic metabolic panel         CBC         Comprehensive metabolic panel         Blood gas, venous       Review of Systems:  Review of Systems  Constitutional:  Negative for chills, fever, malaise/fatigue and weight loss.  Respiratory:  Positive for cough, sputum production and shortness of breath. Negative for hemoptysis and wheezing.   Cardiovascular:  Negative for chest pain and palpitations.  Gastrointestinal:  Negative for abdominal pain, constipation, diarrhea, heartburn, nausea and vomiting.  Genitourinary:  Negative for dysuria and urgency.  Musculoskeletal:  Negative for back pain, myalgias and neck pain.       Left-sided arm and forearm redness and swelling for 7 to 10 days  Neurological:  Negative for dizziness and headaches.  Psychiatric/Behavioral:  The patient is not nervous/anxious.     Past Medical History:  Diagnosis Date   Acute GI bleeding 02/18/2020   Melena with hemoglobin down to 10 February 2020.  EGD with intervention-follow-up with Dr. Wilhelmenia     Allergy     Blood transfusion without reported diagnosis    CHF (congestive heart failure) (HCC) 2024  [facility] re[facility] told us    Chronic back pain    mid and lower (04/07/2018)   Chronic rhinitis    -Sinus Ct 08/01/2009 >> Bilateral maxillary sinusitis with some mucosal thickeningin the sphenoid and frontal sinuses as well with air fluid levels present -chronic rhinitis flyer Aug 04, 2009   Clotting disorder [facility]) 2022@2024    Internal bleed   Compressed spine fracture (HCC)    COPD (chronic obstructive pulmonary disease) (HCC)    PFT's rec Jul 17, 2009   Dyspnea    Emphysema lung (HCC)    Emphysema of lung (HCC)    GERD (gastroesophageal reflux disease)    Hyperglycemia 06/12/2016   Hypertension 2/24   Questionable   Iron  deficiency anemia due to chronic blood  loss 08/23/2020   Left ankle swelling 07/22/2014   HTN- controlled when fluid status contolled. on triamterene -hctz 37.5-25mg  sparingly  Unclear etiology.   LLE U/S 2016:     Leukocytosis 12/19/2016   Melena 08/11/2022   On home oxygen therapy    3L; 24/7 (04/07/2018)   Onychomycosis    Dr. Naomi   Orthostatic hypotension    since 10/2017 (04/07/2018)   Osteoporosis    Oxygen deficiency    PAD (peripheral artery disease) (HCC)    Pneumonia    twice in 1 year (04/07/2018)   Pulmonary embolism (HCC) 04/07/2018   Skin cancer    lips, face, ears, arms (04/07/2018)   Small cell lung cancer, right (HCC) 2023   Dr. Dewey.  rad   Vertigo    since ~ 02/2018 (04/07/2018)    Past Surgical History:  Procedure Laterality Date   ABDOMINAL AORTOGRAM W/LOWER EXTREMITY N/A 01/21/2020   Procedure: ABDOMINAL AORTOGRAM W/LOWER EXTREMITY;  Surgeon: Harvey Carlin BRAVO, MD;  [facility]: [facility] INVASIVE CV LAB;  Service: Cardiovascular;  Laterality: N/A;   APPENDECTOMY  1998   BIOPSY  02/20/2020   Procedure: BIOPSY;  Surgeon: Wilhelmenia Aloha Raddle., MD;  Location: [facility] ENDOSCOPY;  Service: Gastroenterology;;   BIOPSY  08/12/2022   Procedure: BIOPSY;  Surgeon: San Sandor GAILS, DO;  Location: [facility] ENDOSCOPY;  Service: Gastroenterology;;   BRONCHIAL BIOPSY  09/04/2021   Procedure: BRONCHIAL BIOPSIES;  Surgeon: Brenna Adine CROME, DO;  Location: [facility] ENDOSCOPY;  Service: Pulmonary;;   BRONCHIAL BRUSHINGS  09/04/2021   Procedure: BRONCHIAL BRUSHINGS;  Surgeon: Brenna Adine CROME, DO;  Location: [facility] ENDOSCOPY;  Service: Pulmonary;;   BRONCHIAL NEEDLE ASPIRATION BIOPSY  09/04/2021   Procedure: BRONCHIAL NEEDLE ASPIRATION BIOPSIES;  Surgeon: Brenna Adine CROME, DO;  Location: [facility] ENDOSCOPY;  Service: Pulmonary;;   CATARACT EXTRACTION, BILATERAL     CHOLECYSTECTOMY     ENDARTERECTOMY FEMORAL Right 04/19/2019   Procedure: ENDARTERECTOMY RIGHT COMMON FEMORAL;  Surgeon: Harvey Carlin BRAVO, MD;  Location: [facility] OR;  Service:  Vascular;  Laterality: Right;   ENDARTERECTOMY FEMORAL Left 01/24/2020   Procedure: LEFT FEMORAL ENDARTERECTOMY WITH DACRON PATCH ANGIOPLASTY;  Surgeon: Harvey Carlin BRAVO, MD;  Location: [facility] OR;  Service: Vascular;  Laterality: Left;   ESOPHAGOGASTRODUODENOSCOPY (EGD) WITH PROPOFOL  N/A 02/20/2020   Procedure: ESOPHAGOGASTRODUODENOSCOPY (EGD) WITH PROPOFOL ;  Surgeon: Wilhelmenia Aloha Raddle., MD;  Location: [facility] ENDOSCOPY;  Service: Gastroenterology;  Laterality: N/A;   ESOPHAGOGASTRODUODENOSCOPY (EGD) WITH PROPOFOL  N/A 08/12/2022   Procedure: ESOPHAGOGASTRODUODENOSCOPY (EGD) WITH PROPOFOL ;  Surgeon: San Sandor GAILS, DO;  Location: [facility] ENDOSCOPY;  Service: Gastroenterology;  Laterality: N/A;   EYE SURGERY  2019   Cataract   FEMORAL ENDARTERECTOMY Left 01/24/2020   FIDUCIAL MARKER PLACEMENT  09/04/2021   Procedure: FIDUCIAL  MARKER PLACEMENT;  Surgeon: Brenna Adine CROME, DO;  Location: [facility] ENDOSCOPY;  Service: Pulmonary;;   FRACTURE SURGERY  2019   HEMOSTASIS CLIP PLACEMENT  02/20/2020   Procedure: HEMOSTASIS CLIP PLACEMENT;  Surgeon: Wilhelmenia Aloha Raddle., MD;  Location: [facility] ENDOSCOPY;  Service: Gastroenterology;;   HEMOSTASIS CLIP PLACEMENT  08/12/2022   Procedure: HEMOSTASIS CLIP PLACEMENT;  Surgeon: San Sandor GAILS, DO;  [facility] ENDOS[facility];  Service: Gastroenterology;;   HOT HEMOSTASIS N/A 02/20/2020   Procedure: HOT HEMOSTASIS (ARGON PLASMA COAGULATION/BICAP);  Surgeon: Wilhelmenia Aloha Raddle., MD;  Location: [facility] ENDOSCOPY;  Service: Gastroenterology;  Laterality: N/A;   HOT HEMOSTASIS N/A 08/12/2022   Procedure: HOT HEMOSTASIS (ARGON PLASMA COAGULATION/BICAP);  Surgeon: San Sandor GAILS, DO;  Location: [facility] ENDOSCOPY;  Service: Gastroenterology;  Laterality: N/A;   INSERTION OF ILIAC STENT Left 01/24/2020   Procedure: INSERTION OF VBX STENT 8X59 AND 8X39 LEFT COMMON ILIAC ARTERY. INSERTION OF INNOVA 7 X 60 INNOVA STENT LEFT EXTERNAL ILIAC ARTERY. ;  Surgeon: Harvey Carlin BRAVO, MD;   Location: [facility] OR;  Service: Vascular;  Laterality: Left;   LOWER EXTREMITY ANGIOGRAPHY  12/11/2018   LOWER EXTREMITY ANGIOGRAPHY N/A 12/11/2018   Procedure: LOWER EXTREMITY ANGIOGRAPHY;  Surgeon: Harvey Carlin BRAVO, MD;  [facility];  Service: Cardiovascular;  Laterality: N/A;   PATCH ANGIOPLASTY Right 04/19/2019   Procedure: Patch Angioplasty;  Surgeon: Harvey Carlin BRAVO, MD;  Location: [facility] OR;  Service: Vascular;  Laterality: Right;   PERIPHERAL VASCULAR INTERVENTION Right 12/11/2018   Procedure: PERIPHERAL VASCULAR INTERVENTION;  Surgeon: Harvey Carlin BRAVO, MD;  [facility];  Service: Cardiovascular;  Laterality: Right;  Common Iliac    SKIN CANCER EXCISION     lips, face, ears, arms (04/07/2018)   SUBMUCOSAL INJECTION  08/12/2022   Procedure: SUBMUCOSAL INJECTION;  Surgeon: San Sandor GAILS, DO;  Location: [facility] ENDOSCOPY;  Service: Gastroenterology;;   TONSILLECTOMY     ULTRASOUND GUIDANCE FOR VASCULAR ACCESS Right 01/24/2020   Procedure: ULTRASOUND GUIDANCE FOR VASCULAR ACCESS;  Surgeon: Harvey Carlin BRAVO, MD;  Location: [facility] OR;  Service: Vascular;  Laterality: Right;   VIDEO BRONCHOSCOPY WITH RADIAL ENDOBRONCHIAL ULTRASOUND  09/04/2021   Procedure: VIDEO BRONCHOSCOPY WITH RADIAL ENDOBRONCHIAL ULTRASOUND;  Surgeon: Brenna Adine CROME, DO;  Location: [facility] ENDOSCOPY;  Service: Pulmonary;;     reports that he quit smoking about 6 years ago. His smoking use included cigarettes and pipe. He started smoking about 58 years ago. He has a 104 pack-year smoking history. He has never been exposed to tobacco smoke. He has never used smokeless tobacco. He reports current alcohol  use of about 20.0 standard drinks of alcohol  per week. He reports that he does not use drugs.  Allergies  Allergen Reactions   Tape Other (See Comments)    Skin tears easily   Augmentin  [Amoxicillin -Pot Clavulanate] Other (See Comments)    Thrush Sore throat Hoarse voice   Bactrim   [Sulfamethoxazole -Trimethoprim ] Nausea Only    Also feels weak   Ciprofloxacin  Other (See Comments)    hallucinations   Gadavist  [Gadobutrol ] Other (See Comments)    Unknown reaction   Gadolinium Itching, Rash and Other (See Comments)    Itchy back rash   Lipitor [Atorvastatin ] Rash    Family History  Problem Relation Age of Onset   Heart disease Mother        CABG in her 10s, nonsmoker   Cancer Mother    Stroke Father    Heart disease Father  Died of MI at age 45, smoker   Hepatitis Sister    Coronary artery disease Other        male 1st degree relative <60   Colon cancer Neg Hx    Pancreatic cancer Neg Hx    Esophageal cancer Neg Hx    Inflammatory bowel disease Neg Hx    Liver disease Neg Hx    Rectal cancer Neg Hx    Stomach cancer Neg Hx     Prior to Admission medications   Medication Sig Start Date End Date Taking? Authorizing Provider  acetaminophen  (TYLENOL ) 500 MG tablet Take 500 mg by mouth every 4 (four) hours as needed for moderate pain, fever, headache or mild pain.   Yes [provider]  albuterol  (VENTOLIN  HFA) 108 (90 Base) MCG/ACT inhaler Inhale 2 puffs into the lungs every 6 (six) hours as needed for wheezing or shortness of breath.   Yes [provider]  azithromycin  (ZITHROMAX ) 250 MG tablet TAKE 1 TABLET BY MOUTH 3 TIMES  WEEKLY 11/10/23  Yes Geronimo Amel, MD  Calcium  Carb-Cholecalciferol  (CALCIUM  + D3 PO) Take 1 tablet by mouth daily with breakfast.   Yes [provider]  docusate sodium  (COLACE) 100 MG capsule Take 2 capsules (200 mg total) by mouth 2 (two) times daily. 08/17/22  Yes Elgergawy, Brayton RAMAN, MD  doxycycline  (VIBRA -TABS) 100 MG tablet Take 1 tablet (100 mg total) by mouth 2 (two) times daily. 03/01/24  Yes Geronimo Amel, MD  ELIQUIS  2.5 MG TABS tablet TAKE 1 TABLET BY MOUTH TWICE  DAILY 04/28/23  Yes [name], MD  Ensifentrine  (OHTUVAYRE ) 3 MG/2.5ML SUSP INHALE THE CONTENTS OF 1 AMPULE VIA  NEBULIZER IN THE MORNING AND IN THE EVENING 01/22/24  Yes Geronimo Amel, MD  Ferrous Sulfate  (IRON ) 325 (65 Fe) MG TABS Take 325 mg by mouth See admin instructions. Take 325 mg by mouth with food every other day   Yes [provider]  fexofenadine (ALLEGRA) 180 MG tablet Take 180 mg by mouth daily.   Yes [provider]  fluticasone  (FLONASE ) 50 MCG/ACT nasal spray USE 2 SPRAYS IN BOTH NOSTRILS  DAILY 09/10/23  Yes [name], MD  folic acid  (FOLVITE ) 1 MG tablet Take 1 tablet (1 mg total) by mouth daily. 02/26/20  Yes Mikhail, Maryann, DO  furosemide  (LASIX ) 20 MG tablet TAKE 1 TABLET BY MOUTH DAILY AS  NEEDED FOR FLUID OR EDEMA 11/10/23  Yes [name], MD  guaiFENesin  (MUCINEX  PO) Take 1,200 mg by mouth daily.   Yes [provider]  ipratropium-albuterol  (DUONEB) 0.5-2.5 (3) MG/3ML SOLN TAKE 3 MLS BY NEBULIZATION 3 (THREE) TIMES DAILY. 09/10/23 03/04/24 Yes Hope Almarie ORN, NP  Multiple Vitamin (MULTIVITAMIN WITH MINERALS) TABS tablet Take 1 tablet by mouth daily. 02/26/20  Yes Mikhail, Maryann, DO  OXYGEN Inhale 5.5 L/min into the lungs continuous.   Yes [provider]  pantoprazole  (PROTONIX ) 20 MG tablet TAKE 1 TABLET BY MOUTH DAILY 11/10/23  Yes [name], MD  predniSONE  (DELTASONE ) 10 MG tablet Take 1 tablet (10 mg total) by mouth daily with breakfast. 02/26/24  Yes Geronimo Amel, MD  rosuvastatin  (CRESTOR ) 40 MG tablet Take 1 tablet (40 mg total) by mouth daily. 08/27/23  Yes [name], MD  SPIRIVA  RESPIMAT 2.5 MCG/ACT AERS USE 2 INHALATIONS BY MOUTH ONCE  DAILY 11/03/23  Yes Geronimo Amel, MD  SYMBICORT  160-4.5 MCG/ACT inhaler INHALE 2 INHALATIONS BY MOUTH  TWICE DAILY 09/29/23  Yes Katrinka Garnette  O, MD  SYSTANE PRESERVATIVE FREE 0.4-0.3 % SOLN Place 1 drop into both eyes 2 (two) times daily.   Yes [provider]  traMADol  (ULTRAM ) 50 MG tablet Take 50 mg by mouth every 12 (twelve) hours as needed for severe pain.    Yes [provider]  mupirocin  ointment (BACTROBAN ) 2 % APPLY TOPICALLY TWICE DAILY FOR  SKIN INFECTIONS Patient not taking: Reported on 03/04/2024 11/11/23   [name], MD     Physical Exam: Vitals:   03/04/24 1830 03/04/24 1831 03/04/24 2001 03/04/24 2018  BP: (!) 142/75  112/75   Pulse: (!) 115 (!) 115 (!) 116 (!) 114  Resp: (!) 22 (!) 21 20 (!) 23  Temp:   97.7 F (36.5 C)   TempSrc:   Oral   SpO2:  (!) 86% (!) 83% 96%  Weight:      Height:        Physical Exam Constitutional:      General: He is not in acute distress.    Appearance: He is ill-appearing.  Cardiovascular:     Rate and Rhythm: Normal rate and regular rhythm.  Pulmonary:     Effort: Pulmonary effort is normal. No accessory muscle usage.     Breath sounds: No stridor. Examination of the right-upper field reveals rhonchi. Examination of the left-upper field reveals rhonchi. Examination of the right-middle field reveals rhonchi. Examination of the left-middle field reveals rhonchi. Examination of the right-lower field reveals rhonchi. Examination of the left-lower field reveals wheezing and rhonchi. Wheezing and rhonchi present. No decreased breath sounds or rales.  Chest:     Chest wall: No tenderness.  Musculoskeletal:     Cervical back: Normal range of motion and neck supple.     Right lower leg: No edema.     Left lower leg: No edema.     Comments: Left arm and forearm redness, warm to touch and tenderness on on palpation.  Skin:    General: Skin is warm.     Capillary Refill: Capillary refill takes less than 2 seconds.  Neurological:     Mental Status: He is alert and oriented to person, place, and time.  Psychiatric:        Mood and Affect: Mood normal.      Labs on Admission: I have personally reviewed following labs and imaging studies  CBC: Recent Labs  Lab 03/04/24 1527  WBC 17.4*  NEUTROABS 15.3*  HGB 11.2*  HCT 36.1*  MCV 101.4*  PLT 176   Basic Metabolic  Panel: Recent Labs  Lab 03/04/24 1444  NA 139  K 4.9  CL 92*  CO2 36*  GLUCOSE 102*  BUN 16  CREATININE 1.11  CALCIUM  10.0   GFR: Estimated Creatinine Clearance: 55.7 mL/min (by C-G formula based on SCr of 1.11 mg/dL). Liver Function Tests: No results for input(s): AST, ALT, ALKPHOS, BILITOT, PROT, ALBUMIN  in the last 168 hours. No results for input(s): LIPASE, AMYLASE in the last 168 hours. No results for input(s): AMMONIA in the last 168 hours. Coagulation Profile: No results for input(s): INR, PROTIME in the last 168 hours. Cardiac Enzymes: No results for input(s): CKTOTAL, CKMB, CKMBINDEX, TROPONINI, TROPONINIHS in the last 168 hours. BNP (last 3 results) Recent Labs    07/11/23 0639  BNP 221.1*   HbA1C: No results for input(s): HGBA1C in the last 72 hours. CBG: No results for input(s): GLUCAP in the last 168 hours. Lipid Profile: No results for input(s): CHOL, HDL, LDLCALC, TRIG,  CHOLHDL, LDLDIRECT in the last 72 hours. Thyroid  Function Tests: No results for input(s): TSH, T4TOTAL, FREET4, T3FREE, THYROIDAB in the last 72 hours. Anemia Panel: No results for input(s): VITAMINB12, FOLATE, FERRITIN, TIBC, IRON , RETICCTPCT in the last 72 hours. Urine analysis:    Component Value Date/Time   COLORURINE YELLOW 02/21/2023 0710   APPEARANCEUR CLEAR 02/21/2023 0710   LABSPEC 1.012 02/21/2023 0710   PHURINE 7.0 02/21/2023 0710   GLUCOSEU NEGATIVE 02/21/2023 0710   GLUCOSEU NEGATIVE 06/25/2017 0831   HGBUR NEGATIVE 02/21/2023 0710   HGBUR trace-lysed 02/12/2010 0819   BILIRUBINUR NEGATIVE 02/21/2023 0710   BILIRUBINUR Negative 12/25/2017 0852   KETONESUR 5 (A) 02/21/2023 0710   PROTEINUR NEGATIVE 02/21/2023 0710   UROBILINOGEN 0.2 12/25/2017 0852   UROBILINOGEN 0.2 06/25/2017 0831   NITRITE NEGATIVE 02/21/2023 0710   LEUKOCYTESUR NEGATIVE 02/21/2023 0710    Radiological Exams on Admission: I  have personally reviewed images DG Chest Portable 1 View Result Date: 03/04/2024 CLINICAL DATA:  Shortness of breath. Left hand pain. In stage COPD. EXAM: PORTABLE CHEST 1 VIEW COMPARISON:  07/09/2023 FINDINGS: Shallow inspiration. Scarring in the lung apices and right base with diffuse interstitial pattern throughout the remainder of the lungs. This is similar prior study likely representing chronic interstitial lung disease. No pleural effusion or pneumothorax. Mediastinal contours appear intact. Calcified and tortuous aorta. Degenerative changes in the spine and shoulders. 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  No focal consolidation. Electronically Signed   By: Elsie Gravely M.D.   On: 03/04/2024 15:19   DG Hand Complete Left Result Date: 03/04/2024 CLINICAL DATA:  Left hand pain. EXAM: LEFT HAND - COMPLETE 3+ VIEW COMPARISON:  None Available. FINDINGS: There is no acute fracture or dislocation. The bones are well mineralized. No arthritic changes. Mild diffuse soft tissue swelling. No radiopaque foreign object or soft tissue gas. IMPRESSION: 1. No acute fracture or dislocation. 2. Mild diffuse soft tissue swelling. Electronically Signed   By: Vanetta Chou M.D.   On: 03/04/2024 15:18     EKG: My personal interpretation of EKG shows: EKG shows sinus tachycardia heart rate 113 with premature ventricular complex    Assessment/Plan: Principal Problem:   Cellulitis of left hand Active Problems:   Hyperlipidemia   Essential hypertension   Peripheral artery disease (HCC)   Chronic hypoxic respiratory failure (HCC)   End stage COPD (HCC)   History of lung cancer   Hx of sinus tachycardia   History of pulmonary embolism    Assessment and Plan: Left hand cellulitis -Patient presented emergency department complaining of left sided hand swelling and pain with associated redness that has been ongoing for last several days.  Patient reported that recently he had Mohs surgery of skin  cancer of the dorsal aspect of the left hand about a month ago.  Patient has been taking chronic Bactrim  and steroid for COPD exacerbation however reported that even though he has been taking antibiotic the hand infection is still getting worse. -At presentation to ED patient is hemodynamically stable except O2 sat dropped to 83% on 6 L oxygen and placed on high flow oxygen.  At home patient takes trilogy machine and multiple long and short acting inhaler. - X-ray of the hand showed mild diffuse soft tissue swelling of the left hand.  No fracture or dislocation and evidence of osteomyelitis. - In the ED patient has been treated with vancomycin . - Obtaining blood culture.  Continue IV vancomycin  and ceftriaxone . - Based on blood culture result can  de-escalate antibiotic.  If hand infection does not improve in next 12 to 24 hours with IV antibiotic consider to obtain MRI.   Stage IV COPD/end-stage COPD COPD exacerbation Chronic hypoxic respiratory failure 5 L oxygen at baseline -In the ED O2 sat dropped to 83% on 6 L oxygen requiring high flow oxygen and chest x-ray showing pulmonary fibrosis without any evidence of consolidation. -In the ED patient has been treated with Solu-Medrol  125 mg. At home patient takes prednisone  10 mg daily. - Checking VBG.   -Continue IV Solu-Medrol  40 mg twice daily. - Continue Ensifentrine  SUSP 3 mg twice daily, Spiriva  twice daily, Breo once daily, DuoNeb as needed. -Patient uses trilogy machine at home home which is more similar to BiPAP for respiratory.  Continue BiPAP as needed and nightly. Continue pulse ox check and supplemental oxygen 5 L which is patient baseline. -Continue oral suction, spirometry, Mucinex  and supportive care.   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  Patient is actively following [facility] oncology for management.  History of pulmonary embolism -Continue Eliquis   2.5 mg twice daily  History of chronic sinus tachycardia History of chronic sinus tachycardia in the setting of end-stage COPD. - Continue IV Lopressor  as needed  History of essential hypertension -At home patient takes Lasix  20 mg daily.  History of peripheral artery disease -Continue Crestor     DVT prophylaxis:  Eliquis  Code Status:  DNR/DNI(Do NOT Intubate) Diet: Heart healthy diet Family Communication:   Family was present at bedside, at the time of interview. Opportunity was given to ask question and all questions were answered satisfactorily.  Disposition Plan: Pending blood culture results. Consults: Respiratory care Admission status:   Inpatient, Step Down Unit  Severity of Illness: The appropriate patient status for this patient is INPATIENT. Inpatient status is judged to be reasonable and necessary in order to provide the required intensity of service to ensure the patient's safety. The patient's presenting symptoms, physical exam findings, and initial radiographic and laboratory data in the context of their chronic comorbidities is felt to place them at high risk for further clinical deterioration. Furthermore, it is not anticipated that the patient will be medically stable for discharge from the hospital within 2 midnights of admission.   * I certify that at the point of admission it is my clinical judgment that the patient will require inpatient hospital care spanning beyond 2 midnights from the point of admission due to high intensity of service, high risk for further deterioration and high frequency of surveillance required.DEWAINE    Karrah Mangini, MD [facility]  How to contact the [facility] Attending or Consulting provider 7A - 7P or covering provider during after hours 7P -7A, for this patient.  Check the care team in [facility] and look for a) attending/consulting [facility] provider listed and b) the [facility] team listed Log into [email or web address] and use [facility] universal password  to access. If you do not have the password, please contact the hospital operator. Locate the [facility] provider you are looking for under [facility] and page to a number that you can be directly reached. If you still have difficulty reaching the provider, please page the [facility] (Director on Call) for the Hospitalists listed on amion for assistance.  03/04/2024, 9:11 PM

## 2024-03-04 NOTE — ED Notes (Signed)
 RT called to assess patient. Stated his SATs were in the 44s. Patient wears 5.5L at home at all times and came in on RA. Stated he has been more SOB on exertion for about a week, being treated for a COPD flare-up by pulmonologist. SAT now 98% in 5.5L

## 2024-03-04 NOTE — Plan of Care (Signed)
 Patient is a 78 year old male with history of stage IV COPD, chronic hypoxic respiratory failure on 5 L of oxygen at baseline, lung cancer, chronic sinus tachycardia, peripheral artery disease, hyperlipidemia, hypertension, PE who presents the emergency department at [facility] with complaint of left hand pain, swelling, erythema that has been going on last several days.  Patient recently had Mohs surgery for his skin cancer on the dorsal aspect of the left hand about a month ago by his dermatologist. On presentation, he was in sinus tachycardia.  As per the report, he has chronic sinus tachycardia.  On 5 L of oxygen for COPD. He was recently started on Bactrim  and steroid for his COPD exacerbation.  Despite taking antibiotics, hand was still painful and swollen. On presentation, he was afebrile, blood pressure was stable.  Lab work showed WC count of 17.4.  X-ray of the left hand showed diffuse soft tissue swelling.  Patient being admitted for the management of right hand cellulitis.  Due to his chronic hypoxia, chronic sinus tachycardia,we are placing him in progressive bed.  If hand cellulitis does not respond to antibiotics,it  may be prudent to do MRI of the left hand in next 12-24 hours

## 2024-03-04 NOTE — ED Triage Notes (Signed)
 Pt via pov from home with left hand pain (had surgery for skin cancer on 8/4) and increasing sob (has end-stage copd) in last couple of days. Pt had bactrim  prescribed 8/23 but was unable to tolerate it and quit after 2 days. He was changed to doxycyline on 8/26. Pt a&o x 4; unable to assist much with moving due to sob and hand pain.

## 2024-03-04 NOTE — ED Provider Notes (Signed)
 [place] EMERGENCY DEPARTMENT AT [facility] Provider Note   CSN: [id number] Arrival date & time: 03/04/24  1355     Patient presents with: Hand Pain and Shortness of Breath   Francisco [name] is a 78 y.o. male patient with history of end-stage COPD on 5 L normally who presents to the emergency department today for further evaluation of left hand pain, swelling, and erythema is been present for the last several days.  Coincidentally, patient was started on Bactrim  for a COPD exacerbation which he is currently experiencing.  He only tolerated this for 2 days and had to stop as he was having some nausea and states that it would cause his left hand to hurt.  He was switched to doxycycline  and placed on prednisone  taper.  Currently taking this.  He states that the hand is still painful and more swollen and erythematous than normal.  Patient did have Mohs surgery secondary to skin cancer on the left dorsal aspect of the hand over a month ago.  He contacted his dermatologist and sent them pictures and stated that this was not from the surgery.  Patient states that his heart rate is always elevated secondary to his end-stage COPD.  He is not feeling any more short of breath than normal and denies any chest pain.  Denies any fever or chills.    Hand Pain Associated symptoms include shortness of breath.  Shortness of Breath      Prior to Admission medications   Medication Sig Start Date End Date Taking? Authorizing Provider  acetaminophen  (TYLENOL ) 500 MG tablet Take 500 mg by mouth every 4 (four) hours as needed for moderate pain, fever, headache or mild pain.   Yes [provider]  albuterol  (VENTOLIN  HFA) 108 (90 Base) MCG/ACT inhaler Inhale 2 puffs into the lungs every 6 (six) hours as needed for wheezing or shortness of breath.   Yes [provider]  azithromycin  (ZITHROMAX ) 250 MG tablet TAKE 1 TABLET BY MOUTH 3 TIMES  WEEKLY 11/10/23  Yes Geronimo Amel, MD   Calcium  Carb-Cholecalciferol  (CALCIUM  + D3 PO) Take 1 tablet by mouth daily with breakfast.   Yes [provider]  docusate sodium  (COLACE) 100 MG capsule Take 2 capsules (200 mg total) by mouth 2 (two) times daily. 08/17/22  Yes Elgergawy, Brayton RAMAN, MD  doxycycline  (VIBRA -TABS) 100 MG tablet Take 1 tablet (100 mg total) by mouth 2 (two) times daily. 03/01/24  Yes Geronimo Amel, MD  ELIQUIS  2.5 MG TABS tablet TAKE 1 TABLET BY MOUTH TWICE  DAILY 04/28/23  Yes [name], MD  Ensifentrine  (OHTUVAYRE ) 3 MG/2.5ML SUSP INHALE THE CONTENTS OF 1 AMPULE VIA NEBULIZER IN THE MORNING AND IN THE EVENING 01/22/24  Yes Geronimo Amel, MD  Ferrous Sulfate  (IRON ) 325 (65 Fe) MG TABS Take 325 mg by mouth See admin instructions. Take 325 mg by mouth with food every other day   Yes [provider]  fexofenadine (ALLEGRA) 180 MG tablet Take 180 mg by mouth daily.   Yes [provider]  fluticasone  (FLONASE ) 50 MCG/ACT nasal spray USE 2 SPRAYS IN BOTH NOSTRILS  DAILY 09/10/23  Yes [name], MD  folic acid  (FOLVITE ) 1 MG tablet Take 1 tablet (1 mg total) by mouth daily. 02/26/20  Yes Mikhail, Maryann, DO  furosemide  (LASIX ) 20 MG tablet TAKE 1 TABLET BY MOUTH DAILY AS  NEEDED FOR FLUID OR EDEMA 11/10/23  Yes [name], MD  guaiFENesin  (MUCINEX  PO) Take 1,200  mg by mouth daily.   Yes [provider]  ipratropium-albuterol  (DUONEB) 0.5-2.5 (3) MG/3ML SOLN TAKE 3 MLS BY NEBULIZATION 3 (THREE) TIMES DAILY. 09/10/23 03/04/24 Yes Hope Almarie ORN, NP  Multiple Vitamin (MULTIVITAMIN WITH MINERALS) TABS tablet Take 1 tablet by mouth daily. 02/26/20  Yes Mikhail, Maryann, DO  OXYGEN Inhale 5.5 L/min into the lungs continuous.   Yes [provider]  pantoprazole  (PROTONIX ) 20 MG tablet TAKE 1 TABLET BY MOUTH DAILY 11/10/23  Yes [name], MD  predniSONE  (DELTASONE ) 10 MG tablet Take 1 tablet (10 mg total) by mouth daily with breakfast. 02/26/24  Yes Geronimo Amel, MD  rosuvastatin  (CRESTOR ) 40 MG tablet Take 1 tablet (40 mg total) by mouth daily. 08/27/23  Yes [name], MD  SPIRIVA  RESPIMAT 2.5 MCG/ACT AERS USE 2 INHALATIONS BY MOUTH ONCE  DAILY 11/03/23  Yes Geronimo Amel, MD  SYMBICORT  160-4.5 MCG/ACT inhaler INHALE 2 INHALATIONS BY MOUTH  TWICE DAILY 09/29/23  Yes [name], MD  SYSTANE PRESERVATIVE FREE 0.4-0.3 % SOLN Place 1 drop into both eyes 2 (two) times daily.   Yes [provider]  traMADol  (ULTRAM ) 50 MG tablet Take 50 mg by mouth every 12 (twelve) hours as needed for severe pain.   Yes [provider]  mupirocin  ointment (BACTROBAN ) 2 % APPLY TOPICALLY TWICE DAILY FOR  SKIN INFECTIONS Patient not taking: Reported on 03/04/2024 11/11/23   [name], MD    Allergies: Tape, Augmentin  [amoxicillin -pot clavulanate], Bactrim  [sulfamethoxazole -trimethoprim ], Ciprofloxacin , Gadavist  [gadobutrol ], Gadolinium, and Lipitor [atorvastatin ]    Review of Systems  Respiratory:  Positive for shortness of breath.   All other systems reviewed and are negative.   Updated Vital Signs BP (!) 181/65 (BP Location: Right Arm)   Pulse (!) 131   Temp (!) 97.2 F (36.2 C)   Resp 14   Ht 6' (1.829 m)   Wt 69.6 kg   SpO2 (!) 87%   BMI 20.81 kg/m   Physical Exam Vitals and nursing note reviewed.  Constitutional:      General: He is not in acute distress.    Appearance: Normal appearance.  HENT:     Head: Normocephalic and atraumatic.  Eyes:     General:        Right eye: No discharge.        Left eye: No discharge.  Cardiovascular:     Rate and Rhythm: Tachycardia present.     Comments: S1/S2 are distinct without any evidence of murmur, rubs, or gallops.  Radial pulses are 2+ bilaterally.  Dorsalis pedis pulses are 2+ bilaterally.  No evidence of pedal edema. Pulmonary:     Comments: Clear to auscultation bilaterally.  Normal effort.  No respiratory distress.  No evidence of wheezes, rales, or rhonchi  heard throughout. Abdominal:     General: Abdomen is flat. Bowel sounds are normal. There is no distension.     Tenderness: There is no abdominal tenderness. There is no guarding or rebound.  Musculoskeletal:        General: Normal range of motion.     Cervical back: Neck supple.  Skin:    General: Skin is warm and dry.     Findings: No rash.  Neurological:     General: No focal deficit present.     Mental Status: He is alert.  Psychiatric:        Mood and Affect: Mood normal.        Behavior: Behavior normal.     (  all labs ordered are listed, but only abnormal results are displayed) Labs Reviewed  CBC WITH DIFFERENTIAL/PLATELET - Abnormal; Notable for the following components:      Result Value   WBC 17.4 (*)    RBC 3.56 (*)    Hemoglobin 11.2 (*)    HCT 36.1 (*)    MCV 101.4 (*)    Neutro Abs 15.3 (*)    Lymphs Abs 0.5 (*)    Abs Immature Granulocytes 0.33 (*)    All other components within normal limits  BASIC METABOLIC PANEL WITH GFR - Abnormal; Notable for the following components:   Chloride 92 (*)    CO2 36 (*)    Glucose, Bld 102 (*)    All other components within normal limits    EKG: EKG Interpretation Date/Time:  Thursday March 04 2024 14:11:23 EDT Ventricular Rate:  113 PR Interval:  164 QRS Duration:  78 QT Interval:  309 QTC Calculation: 426 R Axis:   31  Text Interpretation: Sinus tachycardia Paired ventricular premature complexes Confirmed by Ruthe Cornet [id number]) on 03/04/2024 2:14:41 PM  Radiology: [facility] Chest Portable 1 View Result Date: 03/04/2024 CLINICAL DATA:  Shortness of breath. Left hand pain. In stage COPD. EXAM: PORTABLE CHEST 1 VIEW COMPARISON:  07/09/2023 FINDINGS: Shallow inspiration. Scarring in the lung apices and right base with diffuse interstitial pattern throughout the remainder of the lungs. This is similar prior study likely representing chronic interstitial lung disease. No pleural effusion or pneumothorax. Mediastinal  contours appear intact. Calcified and tortuous aorta. Degenerative changes in the spine and shoulders. 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  No focal consolidation. Electronically Signed   By: Elsie Gravely M.D.   On: 03/04/2024 15:19   DG Hand Complete Left Result Date: 03/04/2024 CLINICAL DATA:  Left hand pain. EXAM: LEFT HAND - COMPLETE 3+ VIEW COMPARISON:  None Available. FINDINGS: There is no acute fracture or dislocation. The bones are well mineralized. No arthritic changes. Mild diffuse soft tissue swelling. No radiopaque foreign object or soft tissue gas. IMPRESSION: 1. No acute fracture or dislocation. 2. Mild diffuse soft tissue swelling. Electronically Signed   By: Vanetta Chou M.D.   On: 03/04/2024 15:18     Procedures   Medications Ordered in the ED  albuterol  (PROVENTIL ,VENTOLIN ) solution continuous neb (10 mg/hr Nebulization Not Given 03/04/24 1520)  methylPREDNISolone  sodium succinate (SOLU-MEDROL ) 125 mg/2 mL injection 125 mg (125 mg Intravenous Given 03/04/24 1508)  albuterol  (PROVENTIL ) (2.5 MG/3ML) 0.083% nebulizer solution (10 mg  Given 03/04/24 1520)    Clinical Course as of 03/04/24 1653  Thu Mar 04, 2024  1629 I spoke with Dr. Jillian with [facility] who agrees to admit the patient. [CF]  1630 CBC with Differential(!) There is no evidence of leukocytosis.  Could be steroid-induced versus infectious process. [CF]  1631 Basic metabolic panel(!) Mild hypochloremia.  No other abnormalities. [CF]    Clinical Course User Index [CF] Theotis Cameron HERO, PA-C    Medical Decision Making JERMANIE MINSHALL [name] is a 78 y.o. male patient who presents to the emergency department today for further evaluation of left hand pain.  Will plan to get some basic labs, imaging over the left hand in addition to a chest x-ray to evaluate for any pneumonia as the patient's undergoing an active COPD flare.  Will give him continuous nebulizer treatment and some steroids as  well.  Given that the patient is having some left hand erythema, swelling, and pain and has been on 2  antibiotics I do feel the patient can likely benefit from further evaluation inside the hospital with IV antibiotic therapy.  Will plan to admit him to the hospitalist service.  From a COPD exacerbation perspective patient is feeling somewhat better after continuous neb and Solu-Medrol .  He is stable for admission at this time.  Amount and/or Complexity of Data Reviewed Labs: ordered. Decision-making details documented in ED Course. Radiology: ordered.  Risk Prescription drug management. Decision regarding hospitalization.     Final diagnoses:  Hand swelling    ED Discharge Orders     None          Theotis Cameron HERO, [place] 03/04/24 1653    Ruthe Cornet, DO 03/05/24 0700

## 2024-03-05 ENCOUNTER — Telehealth: Payer: Self-pay

## 2024-03-05 DIAGNOSIS — L03114 Cellulitis of left upper limb: Secondary | ICD-10-CM | POA: Diagnosis not present

## 2024-03-05 DIAGNOSIS — J449 Chronic obstructive pulmonary disease, unspecified: Secondary | ICD-10-CM | POA: Diagnosis not present

## 2024-03-05 DIAGNOSIS — J9611 Chronic respiratory failure with hypoxia: Secondary | ICD-10-CM | POA: Diagnosis not present

## 2024-03-05 LAB — CBC
HCT: 35.3 % — ABNORMAL LOW (ref 39.0–52.0)
Hemoglobin: 10.7 g/dL — ABNORMAL LOW (ref 13.0–17.0)
MCH: 31.1 pg (ref 26.0–34.0)
MCHC: 30.3 g/dL (ref 30.0–36.0)
MCV: 102.6 fL — ABNORMAL HIGH (ref 80.0–100.0)
Platelets: 170 K/uL (ref 150–400)
RBC: 3.44 MIL/uL — ABNORMAL LOW (ref 4.22–5.81)
RDW: 12.8 % (ref 11.5–15.5)
WBC: 9.8 K/uL (ref 4.0–10.5)
nRBC: 0 % (ref 0.0–0.2)

## 2024-03-05 LAB — COMPREHENSIVE METABOLIC PANEL WITH GFR
ALT: 12 U/L (ref 0–44)
AST: 24 U/L (ref 15–41)
Albumin: 3.8 g/dL (ref 3.5–5.0)
Alkaline Phosphatase: 67 U/L (ref 38–126)
Anion gap: 12 (ref 5–15)
BUN: 21 mg/dL (ref 8–23)
CO2: 35 mmol/L — ABNORMAL HIGH (ref 22–32)
Calcium: 9.2 mg/dL (ref 8.9–10.3)
Chloride: 91 mmol/L — ABNORMAL LOW (ref 98–111)
Creatinine, Ser: 0.96 mg/dL (ref 0.61–1.24)
GFR, Estimated: >60 mL/min (ref >60)
Glucose, Bld: 133 mg/dL — ABNORMAL HIGH (ref 70–99)
Potassium: 5.1 mmol/L (ref 3.5–5.1)
Sodium: 138 mmol/L (ref 135–145)
Total Bilirubin: 0.2 mg/dL (ref 0.0–1.2)
Total Protein: 6.6 g/dL (ref 6.5–8.1)

## 2024-03-05 LAB — BLOOD GAS, VENOUS
Acid-Base Excess: 17.7 mmol/L — ABNORMAL HIGH (ref 0.0–2.0)
Acid-Base Excess: 16.7 mmol/L — ABNORMAL HIGH (ref 0.0–2.0)
Bicarbonate: 47.9 mmol/L — ABNORMAL HIGH (ref 20.0–28.0)
Bicarbonate: 45.1 mmol/L — ABNORMAL HIGH (ref 20.0–28.0)
Drawn by: 64037
O2 Saturation: 79.3 %
O2 Saturation: 47.8 %
Patient temperature: 37
Patient temperature: 37
pCO2, Ven: 93 mmHg (ref 44–60)
pCO2, Ven: 78 mmHg (ref 44–60)
pH, Ven: 7.32 (ref 7.25–7.43)
pH, Ven: 7.37 (ref 7.25–7.43)
pO2, Ven: 51 mmHg — ABNORMAL HIGH (ref 32–45)
pO2, Ven: 31 mmHg — CL (ref 32–45)

## 2024-03-05 MED ORDER — SODIUM CHLORIDE 0.9 % IV SOLN: INTRAVENOUS | Status: AC

## 2024-03-05 MED ORDER — VANCOMYCIN HCL 1250 MG/250ML IV SOLN
1250.0000 mg | Freq: Every day | INTRAVENOUS | Status: DC
  Administered 2024-03-05 – 2024-03-07 (×3): 1250 mg via INTRAVENOUS
  Filled 2024-03-05 (×4): qty 250

## 2024-03-05 NOTE — Plan of Care (Signed)
  Problem: Nutrition: Goal: Adequate nutrition will be maintained Outcome: Progressing   Problem: Education: Goal: Knowledge of General Education information will improve Description: Including pain rating scale, medication(s)/side effects and non-pharmacologic comfort measures Outcome: Progressing   Problem: Health Behavior/Discharge Planning: Goal: Ability to manage health-related needs will improve Outcome: Progressing   Problem: Clinical Measurements: Goal: Ability to maintain clinical measurements within normal limits will improve Outcome: Progressing Goal: Will remain free from infection Outcome: Progressing Goal: Diagnostic test results will improve Outcome: Progressing Goal: Respiratory complications will improve Outcome: Progressing Goal: Cardiovascular complication will be avoided Outcome: Progressing   Problem: Activity: Goal: Risk for activity intolerance will decrease Outcome: Progressing   Problem: Nutrition: Goal: Adequate nutrition will be maintained Outcome: Progressing   Problem: Coping: Goal: Level of anxiety will decrease Outcome: Progressing   Problem: Elimination: Goal: Will not experience complications related to bowel motility Outcome: Progressing Goal: Will not experience complications related to urinary retention Outcome: Progressing   Problem: Pain Managment: Goal: General experience of comfort will improve and/or be controlled Outcome: Progressing   Problem: Safety: Goal: Ability to remain free from injury will improve Outcome: Progressing   Problem: Skin Integrity: Goal: Risk for impaired skin integrity will decrease Outcome: Progressing

## 2024-03-05 NOTE — Progress Notes (Addendum)
     Patient Name: Francisco [name]           DOB: 07-21-1945  MRN: [id number]      Admission Date: 03/04/2024  Attending Provider: Jillian Buttery, MD  Primary Diagnosis: Cellulitis of left hand   Level of care: Progressive   OVERNIGHT EVENT   Notified of critical pCO2 level- 79--> 93.  Has history of chronic respiratory failure with hypoxia and hypercapnia (baseline pCO2 70s), end-stage COPD.   Has remained on BiPAP for 5 hours.  Given rise in CO2, will continue BiPAP therapy.  RT to make changes.  Will recheck blood gas in the morning again. Per RN assessment, A/O x 4 with no change in mentation.  No respiratory distress or discomfort reported tonight.   Plan: VBG at 1000    Brayon Bielefeld, DNP, ACNPC- AG [facility] [place]

## 2024-03-05 NOTE — Progress Notes (Signed)
   03/05/24 2322  BiPAP/CPAP/SIPAP  BiPAP/CPAP/SIPAP Pt Type Adult  BiPAP/CPAP/SIPAP SERVO  Mask Type Full face mask  Dentures removed? Not applicable  Mask Size Large  Set Rate 15 breaths/min  Respiratory Rate 25 breaths/min  IPAP 15 cmH20  EPAP 5 cmH2O  FiO2 (%) 40 %  Minute Ventilation 13.3  Leak 59  Peak Inspiratory Pressure (PIP) 16  Tidal Volume (Vt) 730  Patient Home Machine No  Patient Home Mask No  Patient Home Tubing No  Auto Titrate No  Press High Alarm 30 cmH2O  Device Plugged into RED Power Outlet Yes  BiPAP/CPAP /SiPAP Vitals  Pulse Rate 73  Resp (!) 25  SpO2 100 %

## 2024-03-05 NOTE — Progress Notes (Signed)
 Pt arrived to unit at around 1955 via care link from [facility] . Sats are in the lower to mid 80s upon arrival to the floor on 5LNC (pt's baseline 02). Pt denies any respiratory distress, vitals otherwise stable. Pt's work of breathing normal and unlabored . Respiratory called and came to the bedside. See vital sign flowsheet , pt placed on 7L HFNC by respiratory sats now 95-96%.  Continuous pulse ox applied. Admissions paged and made aware of pt's arrival . Provider on call aware of the above, see new floor orders.

## 2024-03-05 NOTE — TOC Initial Note (Signed)
 Transition of Care [facility]) - Initial/Assessment Note   Patient Details  Name: Francisco [name] MRN: [id number] Date of Birth: June 09, 1946  Transition of Care [facility]) CM/SW Contact:    Duwaine GORMAN Aran, LCSW Phone Number: 03/05/2024, 10:18 AM  Clinical Narrative: Patient is from home with spouse. Patient is currently on IV antibiotics. Care management following for possible discharge needs.  Expected Discharge Plan: Home/Self Care Barriers to Discharge: Continued Medical Work up  Expected Discharge Plan and Services In-house Referral: Clinical Social Work Living arrangements for the past 2 months: Single Family Home             DME Arranged: N/A DME Agency: NA  Prior Living Arrangements/Services Living arrangements for the past 2 months: Single Family Home Lives with:: Spouse Patient language and need for interpreter reviewed:: Yes Do you feel safe going back to the place where you live?: Yes      Need for Family Participation in Patient Care: No (Comment) Care giver support system in place?: Yes (comment) Current home services: DME [facility], [facility]) Criminal Activity/Legal Involvement Pertinent to Current Situation/Hospitalization: No - Comment as needed  Activities of Daily Living ADL Screening (condition at time of admission) Independently performs ADLs?: No Does the patient have a NEW difficulty with bathing/dressing/toileting/self-feeding that is expected to last >3 days?: Yes (Initiates electronic notice to provider for possible OT consult) Does the patient have a NEW difficulty with getting in/out of bed, walking, or climbing stairs that is expected to last >3 days?: Yes (Initiates electronic notice to provider for possible PT consult) Does the patient have a NEW difficulty with communication that is expected to last >3 days?: Yes (Initiates electronic notice to provider for possible SLP consult) Is the patient deaf or have difficulty hearing?: No Does the patient have difficulty  seeing, even when wearing glasses/contacts?: No Does the patient have difficulty concentrating, remembering, or making decisions?: No  Emotional Assessment Orientation: : Oriented to Self, Oriented to Place, Oriented to  Time, Oriented to Situation Alcohol  / Substance Use: Not Applicable Psych Involvement: No (comment)  Admission diagnosis:  Hand swelling [M79.89] Cellulitis of left hand [L03.114] Patient Active Problem List   Diagnosis Date Noted   Cellulitis of left hand 03/04/2024   History of lung cancer 03/04/2024   Hx of sinus tachycardia 03/04/2024   History of pulmonary embolism 03/04/2024   Acute on chronic respiratory failure with hypoxia and hypercapnia (HCC) 07/11/2023   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   Skin tear of elbow without complication, left, initial encounter 07/10/2023   Vitamin D  deficiency 03/03/2023   Syncope and collapse 02/20/2023   Lumbar compression fracture (HCC) 09/11/2022   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   Gastritis and gastroduodenitis 08/12/2022   Acute blood loss anemia (ABLA) 08/12/2022   Chronic systolic congestive heart failure (HCC) 12/10/2021   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   History of upper GI bleeding 04/30/2020   Hiatal hernia 04/30/2020   Chronic anticoagulation 04/30/2020   Anemia 04/30/2020   Malignant neoplasm of lower lobe of right lung [facility])    Peripheral artery disease (HCC) 12/11/2018   Pre-operative respiratory examination 12/10/2018   COPD with acute exacerbation (HCC) 11/27/2018   End stage COPD (HCC) 11/27/2018   DNR (do not resuscitate) 04/21/2018   Diastolic dysfunction 04/21/2018   Chronic pulmonary embolism (HCC) 04/07/2018   Essential tremor 03/31/2018   BPPV (benign paroxysmal positional vertigo), right 02/10/2018   Osteoporosis 11/19/2017   Thoracic compression fracture (HCC) 11/02/2017  Essential hypertension 11/02/2017   Venous stasis dermatitis of both  lower extremities 11/02/2017   Chronic hypoxic respiratory failure (HCC) 09/23/2017   BPH associated with nocturia 06/25/2017   Hyperlipidemia 07/22/2014   History of skin cancer 05/17/2014   Chronic rhinitis 10/28/2012   Pulmonary nodule 09/23/2011   Former smoker 08/23/2008   History of colonic polyps 08/23/2008   PCP:  [name], MD Pharmacy:   [phone] [place] [place] - [address] [place] [address] Phone: [phone] Fax: [phone]  [facility] [place] [facility] [place] [facility] [address] [place] [address] Phone: [phone] Fax: [phone]  [facility] - [place], [place] - [address] [place] [address] Phone: [phone] Fax: [phone]  [facility] [address] [place] [address] Phone: [phone] Fax: [phone]  Social Drivers of Health (SDOH) Social History: SDOH Screenings   Food Insecurity: No Food Insecurity (03/04/2024)  Housing: Low Risk  (03/04/2024)  Transportation Needs: No Transportation Needs (03/04/2024)  Utilities: Not At Risk (03/04/2024)  Alcohol  Screen: Low Risk  (02/03/2023)  Depression (PHQ2-9): Low Risk  (03/03/2023)  Financial Resource Strain: Low Risk  (02/03/2023)  Physical Activity: Unknown (02/03/2023)  Social Connections: Moderately Isolated (03/04/2024)  Stress: No Stress Concern Present (02/03/2023)  Tobacco Use: Medium Risk (03/04/2024)   SDOH Interventions:    Readmission Risk Interventions    03/05/2024   10:15 AM 07/14/2023    2:38 PM 08/13/2022    1:46 PM  Readmission Risk Prevention Plan  Transportation Screening Complete Complete Complete  PCP or Specialist Appt within 5-7 Days   Complete  PCP or Specialist Appt within 3-5 Days  Complete   Home Care Screening Complete  Complete  Medication Review (RN CM) Complete   Complete  HRI or Home Care Consult  Complete   Social Work Consult for Recovery Care Planning/Counseling  Complete   Palliative Care Screening  Not Applicable   Medication Review Oceanographer)  Complete

## 2024-03-05 NOTE — Progress Notes (Signed)
 Pt was placed on BIPAP tonight at 0000.  Pt stated that he only wears his [facility] machine at night for 4-5 hours. I was given in report that the family would rather him wear the machine here than to bring in his machine from home.  However, at this time, the family members nor the pt knows what his settings are on his home unit.  Pt was placed on our BiPAP with settings where he was receiving appropriate tidal volumes and his vitals were stable.  Pt was taken off the BiPap at at 5am and placed back on his 7LNC.  I was notified an hour later that the pt VBG showed elevated PaCO2. I spoke with Andrez, [place] and we discussed the reasoning for pt elevated PaCO2, especially because we do not know what the pt settings are on his home unit. Andrez, [place] gave verbal orders for pt to be placed back on BIPAP with adjusted settings to correct pt high PaCO2 per RT and she would order VBG for later in the morning.  The adjusted settings are below. Spoke with son this morning and he is contacting the company with pt home machine to get his settings.    03/05/24 0600  BiPAP/CPAP/SIPAP  BiPAP/CPAP/SIPAP Pt Type Adult  BiPAP/CPAP/SIPAP SERVO  Mask Type Full face mask  Dentures removed? Not applicable  Mask Size Medium  Set Rate (S)  15 breaths/min  IPAP (S)  15 cmH20 (10 PS above PEEP)  EPAP 5 cmH2O  FiO2 (%) 45 %

## 2024-03-05 NOTE — Progress Notes (Signed)
 CPT ordered with the chest vest. Family refused because the last time one was used on patient at [facility], he developed 2 rib fractures. MD aware

## 2024-03-05 NOTE — Hospital Course (Addendum)
 Francisco [name] is a 78 year old male with PMH end-stage COPD, chronic hypoxic respiratory failure on 5 L oxygen at home, lung cancer, skin cancer, PAD, HLD, HTN, history of PE on Eliquis .  He had recent surgery on his left hand to remove a skin cancer by dermatology on 02/09/2024.  On 02/28/2024 he noticed his hand was developing redness, pain, swelling.  He initially was started on Bactrim  after talking to pulmonology as he was also being started on prednisone  for a COPD exacerbation.  After 2 days of Bactrim  he felt he was having a reaction and was therefore changed to doxycycline .  He started doxycycline  on 03/02/2024.  Due to feeling that there was no improvement in his hand, he presented for further evaluation.   He was started on vancomycin  and Rocephin .  The morning following admission he had significant improvement in the redness and tenderness in his hand.     Assessment and Plan: Left hand cellulitis - Presented with worsening left hand erythema, tenderness, edema; do not think this is related to prior Moh's surgery from 02/09/2024 as that section of his hand is well-healed and symptoms started multiple weeks after surgery - 2 days of Bactrim  followed by doxycycline  for about 3 days prior to admission - Responded well to vancomycin  and Rocephin ; transitioned to doxycycline  to complete course at discharge - No further need for imaging   Stage IV COPD/end-stage COPD COPD exacerbation Chronic hypoxic respiratory failure 5 L oxygen at baseline -In the ED O2 sat dropped to 83% on 6 L oxygen requiring high flow oxygen and chest x-ray showing pulmonary fibrosis without any evidence of consolidation. -In the ED patient has been treated with Solu-Medrol  125 mg. At home patient takes prednisone  10 mg daily. 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  Patient is actively following [facility] health oncology for management.   History of pulmonary embolism -Continue Eliquis  2.5 mg twice daily   History of chronic sinus tachycardia History of chronic sinus tachycardia in the setting of end-stage COPD. - Heart rate controlled at discharge   History of essential hypertension -At home patient takes Lasix  20 mg daily.   History of peripheral artery disease -Continue Crestor 

## 2024-03-05 NOTE — Progress Notes (Signed)
 Patient has been on and off of BIPAP today. He has managed to stay on 1-2 hours,  he alternates back to the HFNC on 5 lpm . Family by bedside. Patient tolerating well

## 2024-03-05 NOTE — Telephone Encounter (Signed)
 Soirry to hear but wishing him well. The Co2 was >90. He is in good hand with the hospitalists

## 2024-03-05 NOTE — Telephone Encounter (Signed)
 Please let Dr.Ram know that Ana was sent from [facility] to [facility] Thursday evening. His CO2. (I think that is what it is called) are high. Got him on CPack vent    Dr Geronimo, pt asked for you to be aware

## 2024-03-05 NOTE — Progress Notes (Signed)
 Progress Note    Francisco [name]   FMW:[id number]  DOB: Jan 07, 1946  DOA: 03/04/2024     1 PCP: [name], MD  Initial CC: left hand cellulitis   Hospital Course: Francisco [name] is a 78 year old male with PMH end-stage COPD, chronic hypoxic respiratory failure on 5 L oxygen at home, lung cancer, skin cancer, PAD, HLD, HTN, history of PE on Eliquis .  He had recent surgery on his left hand to remove a skin cancer by dermatology on 02/09/2024.  On 02/28/2024 he noticed his hand was developing redness, pain, swelling.  He initially was started on Bactrim  after talking to pulmonology as he was also being started on prednisone  for a COPD exacerbation.  After 2 days of Bactrim  he felt he was having a reaction and was therefore changed to doxycycline .  He started doxycycline  on 03/02/2024.  Due to feeling that there was no improvement in his hand, he presented for further evaluation.  He was started on vancomycin  and Rocephin .  The morning following admission he had significant improvement in the redness and tenderness in his hand.   Assessment and Plan: Left hand cellulitis - Presented with worsening left hand erythema, tenderness, edema; do not think this is related to prior Moh's surgery from 02/09/2024 as that section of his hand is well-healed and symptoms started multiple weeks after surgery - 2 days of Bactrim  followed by doxycycline  for about 3 days prior to admission - Responded well to vancomycin  and Rocephin ; patient and wife prefer to continue 1 more day on IV antibiotics with de-escalation to orals tomorrow - No further need for imaging unless worsens    Stage IV COPD/end-stage COPD COPD exacerbation Chronic hypoxic respiratory failure 5 L oxygen at baseline -In the ED O2 sat dropped to 83% on 6 L oxygen requiring high flow oxygen and chest x-ray showing pulmonary fibrosis without any evidence of consolidation. -In the ED patient has been treated with Solu-Medrol  125 mg. At home  patient takes prednisone  10 mg daily. 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  Patient is actively following [facility] health oncology for management.   History of pulmonary embolism -Continue Eliquis  2.5 mg twice daily   History of chronic sinus tachycardia History of chronic sinus tachycardia in the setting of end-stage COPD. - Continue IV Lopressor  as needed   History of essential hypertension -At home patient takes Lasix  20 mg daily.   History of peripheral artery disease -Continue Crestor   Interval History:  Resting comfortably in bed this morning.  He is completely awake, alert, oriented.  No lethargy.  Wife present bedside. Left hand has significantly improved in terms of redness, swelling, pain.  They wish to continue IV antibiotics 1 more day in the hospital.   Old records reviewed in assessment of this patient  Antimicrobials: Vancomycin  03/04/2024 >> current Rocephin  03/04/2024 >> current  DVT prophylaxis:  apixaban  (ELIQUIS ) tablet 2.5 mg Start: 03/04/24 2200 SCDs Start: 03/04/24 2043 Place TED hose Start: 03/04/24 2043 apixaban  (ELIQUIS ) tablet 2.5 mg   Code Status:   Code Status:  Limited: Do not attempt resuscitation (DNR) -DNR-LIMITED -Do Not Intubate/DNI   Mobility Assessment (Last 72 Hours)     Mobility Assessment     Row Name 03/05/24 0820 03/04/24 2015         Does the patient have exclusion criteria? No - Perform mobility assessment No - Perform mobility assessment      What is the highest level of mobility based on the  mobility assessment? Level 2 (Chairfast) - Balance while sitting on edge of bed and cannot stand Level 3 (Stands with assistance) - Balance while standing  and cannot march in place      Is the above level different from baseline mobility prior to current illness? Yes - Recommend PT order Yes - Recommend PT order         Barriers to discharge: None Disposition Plan: Home HH orders placed: N/A Status is: Inpatient  Objective: Blood pressure (!) 148/85, pulse (!) 102, temperature (!) 97.5 F (36.4 C), temperature source Axillary, resp. rate 20, height 6' (1.829 m), weight 69.6 kg, SpO2 100%.  Examination:  Physical Exam Constitutional:      General: He is not in acute distress.    Appearance: Normal appearance.  HENT:     Head: Normocephalic and atraumatic.     Mouth/Throat:     Mouth: Mucous membranes are moist.  Eyes:     Extraocular Movements: Extraocular movements intact.  Cardiovascular:     Rate and Rhythm: Normal rate and regular rhythm.  Pulmonary:     Effort: Pulmonary effort is normal. No respiratory distress.     Breath sounds: Wheezing present.     Comments: Coarse breath sounds bilaterally Abdominal:     General: Bowel sounds are normal. There is no distension.     Palpations: Abdomen is soft.     Tenderness: There is no abdominal tenderness.  Musculoskeletal:        General: Normal range of motion.     Cervical back: Normal range of motion and neck supple.  Skin:    General: Skin is warm and dry.     Comments: Diffuse skin lesions throughout with intermittent bruising especially on arms; well healed scar on left hand dorsum with no further signs of active cellulitis   Neurological:     General: No focal deficit present.     Mental Status: He is alert.  Psychiatric:        Mood and Affect: Mood normal.        Behavior: Behavior normal.      Consultants:    Procedures:    Data Reviewed: Results for orders placed or performed during the hospital  encounter of 03/04/24 (from the past 24 hours)  Basic metabolic panel     Status: Abnormal   Collection Time: 03/04/24  2:44 PM  Result Value Ref Range   Sodium 139 135 - 145 mmol/L   Potassium 4.9 3.5 - 5.1 mmol/L   Chloride 92 (L) 98 - 111 mmol/L   CO2 36 (H) 22 - 32 mmol/L   Glucose, Bld 102 (H) 70 - 99 mg/dL   BUN 16 8 - 23 mg/dL   Creatinine, Ser 8.88 0.61 - 1.24 mg/dL   Calcium  10.0 8.9 - 10.3 mg/dL   GFR, Estimated >39 >39 mL/min   Anion gap 11 5 - 15  CBC with Differential     Status: Abnormal   Collection Time: 03/04/24  3:27 PM  Result Value Ref Range   WBC 17.4 (H) 4.0 -  10.5 K/uL   RBC 3.56 (L) 4.22 - 5.81 MIL/uL   Hemoglobin 11.2 (L) 13.0 - 17.0 g/dL   HCT 63.8 (L) 60.9 - 47.9 %   MCV 101.4 (H) 80.0 - 100.0 fL   MCH 31.5 26.0 - 34.0 pg   MCHC 31.0 30.0 - 36.0 g/dL   RDW 87.1 88.4 - 84.4 %   Platelets 176 150 - 400 K/uL   nRBC 0.0 0.0 - 0.2 %   Neutrophils Relative % 88 %   Neutro Abs 15.3 (H) 1.7 - 7.7 K/uL   Lymphocytes Relative 3 %   Lymphs Abs 0.5 (L) 0.7 - 4.0 K/uL   Monocytes Relative 6 %   Monocytes Absolute 1.0 0.1 - 1.0 K/uL   Eosinophils Relative 1 %   Eosinophils Absolute 0.2 0.0 - 0.5 K/uL   Basophils Relative 0 %   Basophils Absolute 0.1 0.0 - 0.1 K/uL   Immature Granulocytes 2 %   Abs Immature Granulocytes 0.33 (H) 0.00 - 0.07 K/uL  Culture, blood (Routine X 2) w Reflex to ID Panel     Status: None (Preliminary result)   Collection Time: 03/04/24  9:21 PM   Specimen: BLOOD  Result Value Ref Range   Specimen Description      BLOOD BLOOD RIGHT ARM Performed at [facility], [address], [place], [place] [address]    Special Requests      BOTTLES DRAWN AEROBIC AND ANAEROBIC Blood Culture results may not be optimal due to an inadequate volume of blood received in culture bottles Performed at [facility], [address] [place], [place] [address]    Culture      NO GROWTH < 12 HOURS Performed at [facility], [address], [place], [place] [address]    Report Status PENDING   Blood gas, venous     Status: Abnormal   Collection Time: 03/04/24  9:21 PM  Result Value Ref Range   pH, Ven 7.37 7.25 - 7.43   pCO2, Ven 79 (HH) 44 - 60 mmHg   pO2, Ven 63 (H) 32 - 45 mmHg   Bicarbonate 45.7 (H) 20.0 - 28.0 mmol/L   Acid-Base Excess 17.1 (H) 0.0 - 2.0 mmol/L   O2 Saturation 93.4 %   Patient temperature 37.0   Culture, blood (Routine X 2) w Reflex to ID Panel     Status: None (Preliminary result)   Collection Time: 03/04/24  9:25 PM   Specimen: BLOOD  Result Value Ref Range   Specimen Description      BLOOD BLOOD RIGHT HAND Performed at [facility], [address] [place], [place] [address]    Special Requests      BOTTLES DRAWN AEROBIC AND ANAEROBIC Blood Culture results may not be optimal due to an inadequate volume of blood received in culture bottles Performed at [facility], [address], [place], [place] [address]    Culture      NO GROWTH < 12 HOURS Performed at [facility], [address] [place], [place] [address]    Report Status PENDING   CBC     Status: Abnormal   Collection Time: 03/05/24  4:07 AM  Result Value Ref Range   WBC 9.8 4.0 - 10.5 K/uL   RBC 3.44 (L) 4.22 - 5.81 MIL/uL   Hemoglobin 10.7 (L) 13.0 - 17.0 g/dL   HCT 64.6 (L) 60.9 - 47.9 %   MCV 102.6 (  H) 80.0 - 100.0 fL   MCH 31.1 26.0 - 34.0 pg   MCHC 30.3 30.0 - 36.0 g/dL   RDW 87.1 88.4 - 84.4 %   Platelets 170 150 - 400 K/uL   nRBC 0.0 0.0 - 0.2 %  Comprehensive metabolic panel     Status: Abnormal   Collection Time: 03/05/24  4:07 AM  Result Value Ref Range   Sodium 138 135 - 145 mmol/L   Potassium 5.1 3.5 - 5.1 mmol/L   Chloride 91 (L) 98 - 111 mmol/L   CO2 35 (H) 22 - 32 mmol/L   Glucose, Bld 133 (H) 70 - 99 mg/dL   BUN 21 8 - 23 mg/dL   Creatinine, Ser 9.03 0.61 - 1.24 mg/dL   Calcium  9.2 8.9 - 10.3 mg/dL   Total Protein 6.6 6.5 - 8.1  g/dL   Albumin  3.8 3.5 - 5.0 g/dL   AST 24 15 - 41 U/L   ALT 12 0 - 44 U/L   Alkaline Phosphatase 67 38 - 126 U/L   Total Bilirubin 0.2 0.0 - 1.2 mg/dL   GFR, Estimated >39 >39 mL/min   Anion gap 12 5 - 15  Blood gas, venous     Status: Abnormal   Collection Time: 03/05/24  4:07 AM  Result Value Ref Range   pH, Ven 7.32 7.25 - 7.43   pCO2, Ven 93 (HH) 44 - 60 mmHg   pO2, Ven 51 (H) 32 - 45 mmHg   Bicarbonate 47.9 (H) 20.0 - 28.0 mmol/L   Acid-Base Excess 17.7 (H) 0.0 - 2.0 mmol/L   O2 Saturation 79.3 %   Patient temperature 37.0    *Note: Due to a large number of results and/or encounters for the requested time period, some results have not been displayed. A complete set of results can be found in Results Review.    I have reviewed pertinent nursing notes, vitals, labs, and images as necessary. I have ordered labwork to follow up on as indicated.  I have reviewed the last notes from staff over past 24 hours. I have discussed patient's care plan and test results with nursing staff, CM/SW, and other staff as appropriate.  Time spent: Greater than 50% of the 55 minute visit was spent in counseling/coordination of care for the patient as laid out in the A&P.   LOS: 1 day   Alm Apo, MD [facility] 03/05/2024, 11:47 AM

## 2024-03-06 DIAGNOSIS — L03114 Cellulitis of left upper limb: Secondary | ICD-10-CM | POA: Diagnosis not present

## 2024-03-06 LAB — CBC WITH DIFFERENTIAL/PLATELET
Abs Immature Granulocytes: 0.15 K/uL — ABNORMAL HIGH (ref 0.00–0.07)
Basophils Absolute: 0 K/uL (ref 0.0–0.1)
Basophils Relative: 0 %
Eosinophils Absolute: 0 K/uL (ref 0.0–0.5)
Eosinophils Relative: 0 %
HCT: 32.8 % — ABNORMAL LOW (ref 39.0–52.0)
Hemoglobin: 10.1 g/dL — ABNORMAL LOW (ref 13.0–17.0)
Immature Granulocytes: 2 %
Lymphocytes Relative: 6 %
Lymphs Abs: 0.6 K/uL — ABNORMAL LOW (ref 0.7–4.0)
MCH: 31.2 pg (ref 26.0–34.0)
MCHC: 30.8 g/dL (ref 30.0–36.0)
MCV: 101.2 fL — ABNORMAL HIGH (ref 80.0–100.0)
Monocytes Absolute: 0.5 K/uL (ref 0.1–1.0)
Monocytes Relative: 5 %
Neutro Abs: 8.8 K/uL — ABNORMAL HIGH (ref 1.7–7.7)
Neutrophils Relative %: 87 %
Platelets: 167 K/uL (ref 150–400)
RBC: 3.24 MIL/uL — ABNORMAL LOW (ref 4.22–5.81)
RDW: 12.6 % (ref 11.5–15.5)
WBC: 10 K/uL (ref 4.0–10.5)
nRBC: 0 % (ref 0.0–0.2)

## 2024-03-06 LAB — BASIC METABOLIC PANEL WITH GFR
Anion gap: 11 (ref 5–15)
BUN: 21 mg/dL (ref 8–23)
CO2: 36 mmol/L — ABNORMAL HIGH (ref 22–32)
Calcium: 8.6 mg/dL — ABNORMAL LOW (ref 8.9–10.3)
Chloride: 94 mmol/L — ABNORMAL LOW (ref 98–111)
Creatinine, Ser: 0.74 mg/dL (ref 0.61–1.24)
GFR, Estimated: >60 mL/min (ref >60)
Glucose, Bld: 100 mg/dL — ABNORMAL HIGH (ref 70–99)
Potassium: 4.1 mmol/L (ref 3.5–5.1)
Sodium: 142 mmol/L (ref 135–145)

## 2024-03-06 LAB — MAGNESIUM: Magnesium: 2.3 mg/dL (ref 1.7–2.4)

## 2024-03-06 NOTE — Evaluation (Signed)
 Physical Therapy Evaluation Patient Details Name: Francisco [name] MRN: [id number] DOB: 1946/04/17 Today's Date: 03/06/2024  History of Present Illness  Pt is 78 yo male admitted on 03/04/24 with L hand cellulitis after recent removal of skin CA by dermatology on 02/09/24.  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  Clinical Impression  Pt admitted with above diagnosis. Pt from home with family to assist and all needed DME.  He typically only walks 3-5 steps for transfers with RW and assist as needed.  Today, pt agreeable to and motivated for therapy. He was able to transfer and take a few steps with CGA-min A using RW.  Reports much improved from yesterday.  Pt and family comfortable with current mobility level in order to return home.  They are in agreement with ongoing acute therapy and HHPT at d/c to further strengthen and progress.   Pt currently with functional limitations due to the deficits listed below (see PT Problem List). Pt will benefit from acute skilled PT to increase their independence and safety with mobility to allow discharge.           If plan is discharge home, recommend the following: A little help with walking and/or transfers;A little help with bathing/dressing/bathroom;Assistance with cooking/housework;Help with stairs or ramp for entrance   Can travel by private vehicle        Equipment Recommendations None recommended by PT  Recommendations for Other Services       Functional Status Assessment Patient has had a recent decline in their functional status and demonstrates the ability to make significant improvements in function in a reasonable and predictable amount of time.     Precautions / Restrictions Precautions Precautions: Fall      Mobility  Bed Mobility Overal bed mobility: Needs Assistance Bed Mobility: Supine to Sit     Supine to sit: Min assist, HOB  elevated          Transfers Overall transfer level: Needs assistance Equipment used: Rolling walker (2 wheels) Transfers: Sit to/from Stand, Bed to chair/wheelchair/BSC Sit to Stand: Min assist, Contact guard assist   Step pivot transfers: Min assist       General transfer comment: Performed STS from bed, bsc, and chair - progressed from min A to CGA.  Pivoted from bed to bsc with light min A for RW and to steady.  Did have +2 for safety -was reported difficult transfer yesterday - pt confirmed he is doing much better than yesterday.    Ambulation/Gait               General Gait Details: steps to bsc only  Stairs            Wheelchair Mobility     Tilt Bed    Modified Rankin (Stroke Patients Only)       Balance Overall balance assessment: Needs assistance Sitting-balance support: No upper extremity supported Sitting balance-Leahy Scale: Good     Standing balance support: Bilateral upper extremity supported, Reliant on assistive device for balance Standing balance-Leahy Scale: Poor Standing balance comment: CGA-min A with RW                             Pertinent Vitals/Pain Pain Assessment Pain Assessment: No/denies pain    Home Living Family/patient expects to be discharged to:: Private residence  Living Arrangements: Spouse/significant other Available Help at Discharge: Family;Available 24 hours/day Type of Home: House Home Access: Ramped entrance       Home Layout: Two level;Able to live on main level with bedroom/bathroom Home Equipment: Rolling Walker (2 wheels);Rollator (4 wheels);BSC/3in1;Lift chair;Grab bars - toilet;Grab bars - tub/shower;Shower seat - built in;Wheelchair - manual Additional Comments: On home O2 5 L    Prior Function Prior Level of Function : Needs assist             Mobility Comments: Mainly just stand pivots for transfers (walke 3-5 steps max); sleeps in lift chair; no falls ADLs Comments: Wife  assist with dressing and toileting; has assist getting in shower     Extremity/Trunk Assessment   Upper Extremity Assessment Upper Extremity Assessment: Defer to OT evaluation    Lower Extremity Assessment Lower Extremity Assessment: LLE deficits/detail;RLE deficits/detail;Generalized weakness RLE Deficits / Details: Lacks about 5 degrees knee ext otherwise WFL; did not MMT due to fatigue and SOB LLE Deficits / Details: Lacks about 5 degrees knee ext otherwise WFL; did not MMT due to fatigue and SOB    Cervical / Trunk Assessment Cervical / Trunk Assessment: Kyphotic  Communication        Cognition Arousal: Alert Behavior During Therapy: WFL for tasks assessed/performed   PT - Cognitive impairments: No apparent impairments                       PT - Cognition Comments: pleasant, motivated, mentally sharp and whitty         Cueing       General Comments General comments (skin integrity, edema, etc.): Pt on 5 L O2 with  sats >95% throughout session.  HR 120's with activity - pt reports that elevated at baseline.  Pt's wife and daughter present.  They all feel comfortable with pt's mobiltiy for return home. They are intereseted in [facility] therapy to strengthen.    Exercises     Assessment/Plan    PT Assessment Patient needs continued PT services  PT Problem List Decreased strength;Decreased mobility;Decreased activity tolerance;Decreased balance;Cardiopulmonary status limiting activity;Decreased knowledge of use of DME       PT Treatment Interventions Therapeutic exercise;DME instruction;Gait training;Functional mobility training;Therapeutic activities;Patient/family education;Balance training    PT Goals (Current goals can be found in the Care Plan section)  Acute Rehab PT Goals Patient Stated Goal: return home, get stronger PT Goal Formulation: With patient/family Time For Goal Achievement: 03/20/24 Potential to Achieve Goals: Good    Frequency Min  2X/week     Co-evaluation PT/OT/SLP Co-Evaluation/Treatment: Yes Reason for Co-Treatment: For patient/therapist safety;Complexity of the patient's impairments (multi-system involvement) (reported +2 assist; low tolerance)           AM-PAC PT 6 Clicks Mobility  Outcome Measure Help needed turning from your back to your side while in a flat bed without using bedrails?: A Little Help needed moving from lying on your back to sitting on the side of a flat bed without using bedrails?: A Little Help needed moving to and from a bed to a chair (including a wheelchair)?: A Little Help needed standing up from a chair using your arms (e.g., wheelchair or bedside chair)?: A Little Help needed to walk in hospital room?: Total (limited by distance) Help needed climbing 3-5 steps with a railing? : Total 6 Click Score: 14    End of Session Equipment Utilized During Treatment: Gait belt;Oxygen Activity Tolerance: Patient tolerated treatment well Patient left:  in chair;with call bell/phone within reach;with family/visitor present (aware to call for assist) Nurse Communication: Mobility status PT Visit Diagnosis: Other abnormalities of gait and mobility (R26.89);Muscle weakness (generalized) (M62.81)    Time: 8995-8963 PT Time Calculation (min) (ACUTE ONLY): 32 min   Charges:   PT Evaluation $PT Eval Moderate Complexity: 1 Mod   PT General Charges $$ ACUTE PT VISIT: 1 Visit         Benjiman, PT Acute Rehab [facility] [phone]   Benjiman VEAR [place] 03/06/2024, 11:20 AM

## 2024-03-06 NOTE — Evaluation (Signed)
 Occupational Therapy Evaluation Patient Details Name: Francisco [name] MRN: [id number] DOB: 1945-08-26 Today's Date: 03/06/2024   History of Present Illness   Pt is 78 yo male admitted on 03/04/24 with L hand cellulitis after recent removal of skin CA by dermatology on 02/09/24.  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     Clinical Impressions PTA, pt lives with spouse, typically requires no to limited assist for transfers and wife providing light assist for LB ADLs. Pt presents now with baseline deficits in cardiopulmonary endurance and dynamic standing balance. Pt reports feeling much better today, able to demonstrated transfers using RW with CGA-Min A. Pt requires Min A for UB ADL and Mod A for LB ADLs. Wife present and reports comfortable assisting pt at home and interested in [facility] therapy follow up.   HR 120s SpO2 WFL on 5 L O2     If plan is discharge home, recommend the following:   A little help with walking and/or transfers;A little help with bathing/dressing/bathroom;Assistance with cooking/housework     Functional Status Assessment   Patient has had a recent decline in their functional status and demonstrates the ability to make significant improvements in function in a reasonable and predictable amount of time.     Equipment Recommendations   None recommended by OT     Recommendations for Other Services         Precautions/Restrictions   Precautions Precautions: Fall Recall of Precautions/Restrictions: Intact Precaution/Restrictions Comments: watch HR, 5 L O2 at baseline Restrictions Weight Bearing Restrictions Per Provider Order: No     Mobility Bed Mobility Overal bed mobility: Needs Assistance Bed Mobility: Supine to Sit     Supine to sit: Min assist, HOB elevated          Transfers Overall transfer level: Needs assistance Equipment used:  Rolling walker (2 wheels) Transfers: Sit to/from Stand, Bed to chair/wheelchair/BSC Sit to Stand: Min assist, Contact guard assist     Step pivot transfers: Min assist     General transfer comment: Performed STS from bed, bsc, and chair - progressed from min A to CGA.  Pivoted from bed to bsc with light min A for RW and to steady.  Did have +2 for safety -was reported difficult transfer yesterday - pt confirmed he is doing much better than yesterday.      Balance Overall balance assessment: Needs assistance Sitting-balance support: No upper extremity supported Sitting balance-Leahy Scale: Good     Standing balance support: Bilateral upper extremity supported, Reliant on assistive device for balance Standing balance-Leahy Scale: Poor Standing balance comment: CGA-min A with RW                           ADL either performed or assessed with clinical judgement   ADL Overall ADL's : Needs assistance/impaired Eating/Feeding: Independent   Grooming: Set up;Sitting   Upper Body Bathing: Minimal assistance;Sitting   Lower Body Bathing: Moderate assistance;Sitting/lateral leans;Sit to/from stand   Upper Body Dressing : Minimal assistance;Sitting   Lower Body Dressing: Moderate assistance;Sitting/lateral leans;Sit to/from stand   Toilet Transfer: Minimal assistance;+2 for safety/equipment;Stand-pivot;BSC/3in1;Rolling walker (2 wheels) Toilet Transfer Details (indicate cue type and reason): +2 for safety initially but progressing to +1 assist. Toileting- Clothing Manipulation and Hygiene: Moderate assistance;Sitting/lateral lean;Sit to/from stand Toileting - Clothing Manipulation Details (indicate cue type and reason):  assist for peri care and cleaning L thigh per pt request in standing             Vision Baseline Vision/History: 1 Wears glasses Ability to See in Adequate Light: 0 Adequate Patient Visual Report: No change from baseline Vision Assessment?: No  apparent visual deficits     Perception         Praxis         Pertinent Vitals/Pain Pain Assessment Pain Assessment: No/denies pain     Extremity/Trunk Assessment Upper Extremity Assessment Upper Extremity Assessment: Generalized weakness;Right hand dominant   Lower Extremity Assessment Lower Extremity Assessment: Defer to PT evaluation RLE Deficits / Details: Lacks about 5 degrees knee ext otherwise WFL; did not MMT due to fatigue and SOB LLE Deficits / Details: Lacks about 5 degrees knee ext otherwise WFL; did not MMT due to fatigue and SOB   Cervical / Trunk Assessment Cervical / Trunk Assessment: Kyphotic   Communication Communication Communication: No apparent difficulties   Cognition Arousal: Alert Behavior During Therapy: WFL for tasks assessed/performed Cognition: No apparent impairments                               Following commands: Intact       Cueing  General Comments   Cueing Techniques: Verbal cues  SpO2 WFL on 5 L O2 though DOE 2/4. HR 120s with activity with pt reporting this is normal for him. Wife and daughter present   Exercises     Shoulder Instructions      Home Living Family/patient expects to be discharged to:: Private residence Living Arrangements: Spouse/significant other Available Help at Discharge: Family;Available 24 hours/day Type of Home: House Home Access: Ramped entrance     Home Layout: Two level;Able to live on main level with bedroom/bathroom     Bathroom Shower/Tub: Arts development officer Toilet: Handicapped height     Home Equipment: Agricultural consultant (2 wheels);Rollator (4 wheels);BSC/3in1;Lift chair;Grab bars - toilet;Grab bars - tub/shower;Shower seat - built in;Wheelchair - manual   Additional Comments: On home O2 5 L      Prior Functioning/Environment Prior Level of Function : Needs assist             Mobility Comments: Mainly just stand pivots for transfers (walke 3-5 steps max);  sleeps in lift chair; no falls ADLs Comments: Wife assist with dressing and toileting; has assist getting in shower    OT Problem List: Decreased strength;Decreased activity tolerance;Impaired balance (sitting and/or standing);Cardiopulmonary status limiting activity   OT Treatment/Interventions: Self-care/ADL training;Therapeutic exercise;Energy conservation;DME and/or AE instruction;Therapeutic activities;Patient/family education;Balance training      OT Goals(Current goals can be found in the care plan section)   Acute Rehab OT Goals Patient Stated Goal: HH therapy follow up at home, go home today OT Goal Formulation: With patient/family Time For Goal Achievement: 03/20/24 Potential to Achieve Goals: Good   OT Frequency:  Min 2X/week    Co-evaluation   Reason for Co-Treatment: For patient/therapist safety;Complexity of the patient's impairments (multi-system involvement) (reported +2 assist; low tolerance)          AM-PAC OT 6 Clicks Daily Activity     Outcome Measure Help from another person eating meals?: None Help from another person taking care of personal grooming?: A Little Help from another person toileting, which includes using toliet, bedpan, or urinal?: A Lot Help from another person bathing (including washing, rinsing, drying)?: A Lot Help  from another person to put on and taking off regular upper body clothing?: A Little Help from another person to put on and taking off regular lower body clothing?: A Lot 6 Click Score: 16   End of Session Equipment Utilized During Treatment: Gait belt;Rolling walker (2 wheels);Oxygen Nurse Communication: Mobility status  Activity Tolerance: Patient tolerated treatment well Patient left: in chair;with call bell/phone within reach;with chair alarm set  OT Visit Diagnosis: Unsteadiness on feet (R26.81);Other abnormalities of gait and mobility (R26.89);Muscle weakness (generalized) (M62.81)                Time:  8996-8964 OT Time Calculation (min): 32 min Charges:  OT General Charges $OT Visit: 1 Visit OT Evaluation $OT Eval Moderate Complexity: 1 Mod  Mliss NOVAK, OTR/L Acute Rehab Services Office: [phone]   Mliss Fish 03/06/2024, 12:04 PM

## 2024-03-06 NOTE — Progress Notes (Signed)
 RT NOTE:  Pt placed on BiPAP per pt request for a nap.

## 2024-03-06 NOTE — Progress Notes (Signed)
 PROGRESS NOTE Francisco [name]  FMW:[id number] DOB: 1945-09-12 DOA: 03/04/2024 PCP: [name], MD  Brief Narrative/Hospital Course: 78 year old male with PMH end-stage COPD, chronic hypoxic respiratory failure on 5 L oxygen at home, lung cancer, skin cancer, PAD, HLD, HTN, history of PE on Eliquis .  He had recent surgery on his left hand to remove a skin cancer by dermatology on 02/09/2024.  On 02/28/2024 he noticed his hand was developing redness, pain, swelling.  He initially was started on Bactrim  after talking to pulmonology as he was also being started on prednisone  for a COPD exacerbation. After 2 days of Bactrim  he felt he was having a reaction and was therefore changed to doxycycline .  He started doxycycline  on 03/02/2024.  Due to feeling that there was no improvement in his hand, he presented for further evaluation. He was started on vancomycin  and Rocephin .  The morning following admission he had significant improvement in the redness and tenderness in his hand.  Subjective: Seen and examined today Patient's wife and daughter at the bedside He is resting in bedside chair alert awake pleasant Overnight afebrile BP stable, on 5 L [place] Blood gas reviewed from 8/29 pCO2 down to 78 from 93 pH 7.3, WBC was normal 9.8 yesterday Extensive bruising to his upper extremities small blebs on rt arm  Assessment and plan:  Left hand cellulitis prior Moh's surgery on 02/09/2024 that section has well-healed Upper extremity swelling/easy bruisability: symptoms started multiple weeks after surgery-2 days of Bactrim  followed by doxycycline  for about 3 days PTA. Labs showed leukocytosis, blood culture 8/28 NGTD.  X-ray left hand -mild diffuse soft tissue swelling no acute finding-patient being managed with vancomycin  Rocephin  with good response.Has bruise on arms and legs as well also being on blood thinners.  Minimize blood draws, elevate the arm. He is high risk for recurrent cellulitis of the  extremities. Consult wound care.   Stage IV COPD/end-stage COPD on chronic steroid Acute exacerbation of COPD Chronic hypoxic and hypercapnic respiratory failure ON 5l [place] @ HOME Patient hypoxic at 83% 6 L needing HFNC-chest x-ray with chronic fibrosis Blood gas shows acute on chronic CO2 retention in 90s-improved with BiPAP. 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  Patient is actively following [facility] health oncology for management.   History of PE: Continue Eliquis  2.5 mg twice daily   History of chronic sinus tachycardia History of chronic sinus tachycardia in the setting of end-stage COPD. Continue IV Lopressor  as needed   History of essential hypertension At home patient takes Lasix  20 mg daily.   PVD: Continue Crestor   CODE STATUS: Patient is DNR, he is high risk for readmissions decompensation Overall prognosis not bright at least on longer on  Mobility: Mobility: PT Orders: Active  PT Follow up Rec:     DVT prophylaxis: apixaban  (ELIQUIS ) tablet 2.5 mg Start: 03/04/24 2200 SCDs Start: 03/04/24 2043 Place TED hose Start: 03/04/24 2043 Code Status:   Code Status: Limited: Do not attempt resuscitation (DNR) -DNR-LIMITED -Do Not Intubate/DNI  Family Communication: plan of care discussed with patient/wife daughter at bedside. Patient status is: Remains hospitalized because of severity of illness Level of care: Progressive   Dispo: [name] w/ family            Anticipated disposition: Anticipate discharge home in  next 1 to 2 days Objective: Vitals last 24 hrs: Vitals:   03/05/24 2322 03/06/24 0315 03/06/24 0514 03/06/24 0816  BP:   (!) 153/92   Pulse: 73 81 96   Resp: (!) 25 20 17    Temp:   97.6 F (36.4 C)   TempSrc:   Oral    SpO2: 100% 100% 100% 97%  Weight:      Height:        Physical Examination: General exam: alert awake, obese, pleasant oriented, older than stated age HEENT:Oral mucosa moist, Ear/Nose WNL grossly Respiratory system: Bilaterally clear BS,no use of accessory muscle Cardiovascular system: S1 & S2 +, No JVD. Gastrointestinal system: Abdomen soft,NT,ND, BS+ Nervous System: Alert, awake, moving all extremities,and following commands. Extremities: LE edema mild, extensive bruising/thin skin upper extremity lower extremities and swelling in upper extremities  Skin: No rashes,no icterus. MSK: Normal muscle bulk,tone, power   Medications reviewed:  Scheduled Meds:  apixaban   2.5 mg Oral BID   docusate sodium   200 mg Oral BID   Ensifentrine   3 mg Inhalation BID   feeding supplement  237 mL Oral BID BM   ferrous sulfate   325 mg Oral QODAY   fluticasone   1 spray Each Nare Daily   fluticasone  furoate-vilanterol  1 puff Inhalation Daily   folic acid   1 mg Oral Daily   furosemide   20 mg Oral Daily   guaiFENesin   600 mg Oral BID   ipratropium-albuterol   3 mL Nebulization TID   methylPREDNISolone  (SOLU-MEDROL ) injection  40 mg Intravenous Q12H   pantoprazole   20 mg Oral Daily   rosuvastatin   40 mg Oral Daily   sodium chloride  flush  3 mL Intravenous Q12H   umeclidinium bromide   1 puff Inhalation Daily   Continuous Infusions:  sodium chloride  75 mL/hr at 03/06/24 0517   cefTRIAXone  (ROCEPHIN )  IV Stopped (03/05/24 2155)   vancomycin  1,250 mg (03/06/24 0952)   Diet: Diet Order             Diet Heart Room service appropriate? Yes; Fluid consistency: Thin  Diet effective now                    Data Reviewed: I have personally reviewed following labs and imaging studies ( see epic result tab) CBC: Recent Labs  Lab 03/04/24 1527 03/05/24 0407 03/06/24 0859  WBC 17.4* 9.8 10.0  NEUTROABS 15.3*  --  8.8*  HGB 11.2* 10.7* 10.1*  HCT 36.1* 35.3* 32.8*  MCV 101.4* 102.6* 101.2*   PLT 176 170 167   CMP: Recent Labs  Lab 03/04/24 1444 03/05/24 0407 03/06/24 0859  NA 139 138 142  K 4.9 5.1 4.1  CL 92* 91* 94*  CO2 36* 35* 36*  GLUCOSE 102* 133* 100*  BUN 16 21 21   CREATININE 1.11 0.96 0.74  CALCIUM  10.0 9.2 8.6*  MG  --   --  2.3   GFR: Estimated Creatinine Clearance: 77.3 mL/min (by C-G formula based on SCr of 0.74 mg/dL). Recent Labs  Lab 03/05/24 0407  AST 24  ALT 12  ALKPHOS 67  BILITOT 0.2  PROT 6.6  ALBUMIN  3.8   No results for input(s): LIPASE, AMYLASE in the last 168 hours. No results for input(s): AMMONIA in the last 168 hours. Coagulation Profile: No results for input(s): INR, PROTIME in the last 168 hours. Unresulted Labs (From admission, onward)     Start     Ordered   03/06/24 0500  Basic metabolic panel with  GFR  Daily,   R     Question:  Specimen collection method  Answer:  Lab=Lab collect   03/05/24 1512   03/06/24 0500  CBC with Differential/Platelet  Daily,   R     Question:  Specimen collection method  Answer:  Lab=Lab collect   03/05/24 1512   03/06/24 0500  Magnesium   Daily,   R     Question:  Specimen collection method  Answer:  Lab=Lab collect   03/05/24 1512           Antimicrobials/Microbiology: Anti-infectives (From admission, onward)    Start     Dose/Rate Route Frequency Ordered Stop   03/05/24 1800  vancomycin  (VANCOREADY) IVPB 1250 mg/250 mL  Status:  Discontinued        1,250 mg 166.7 mL/hr over 90 Minutes Intravenous Every 24 hours 03/04/24 2111 03/05/24 1420   03/05/24 1515  vancomycin  (VANCOREADY) IVPB 1250 mg/250 mL        1,250 mg 166.7 mL/hr over 90 Minutes Intravenous Daily 03/05/24 1420     03/04/24 2200  cefTRIAXone  (ROCEPHIN ) 2 g in sodium chloride  0.9 % 100 mL IVPB        2 g 200 mL/hr over 30 Minutes Intravenous Every 24 hours 03/04/24 2042     03/04/24 1700  vancomycin  (VANCOCIN ) IVPB 1000 mg/200 mL premix       Placed in Followed by Linked Group   1,000 mg 200 mL/hr over 60  Minutes Intravenous  Once 03/04/24 1659 03/04/24 1848   03/04/24 1700  vancomycin  (VANCOCIN ) 500 mg in sodium chloride  0.9 % 100 mL IVPB       Placed in Followed by Linked Group   500 mg 100 mL/hr over 60 Minutes Intravenous  Once 03/04/24 1659 03/06/24 0050         Component Value Date/Time   SDES  03/04/2024 2125    BLOOD RIGHT HAND Performed at [facility], [address] [place], [place] [address]    SPECREQUEST  03/04/2024 2125    BOTTLES DRAWN AEROBIC AND ANAEROBIC Blood Culture results may not be optimal due to an inadequate volume of blood received in culture bottles Performed at [facility], [address] [place], [place] [address]    CULT  03/04/2024 2125    NO GROWTH 1 DAY Performed at [facility], [address], [place], [place] [address]    REPTSTATUS PENDING 03/04/2024 2125    Procedures:    Mennie LAMY, MD [facility] 03/06/2024, 11:43 AM

## 2024-03-06 NOTE — Consult Note (Signed)
 WOC Nurse Consult Note: Reason for Consult: L arm bruising  Wound type: full thickness L arm r/t trauma  Pressure Injury POA: NA not pressure  Measurement: see nursing flowsheet  Wound bed: dark hemorrhagic tissue  Drainage (amount, consistency, odor) appears dry  Periwound: ecchymosis, dry scaling skin  Dressing procedure/placement/frequency: Cleanse L arm area of bruising/wound with Vashe wound cleanser, allow to air dry. Apply Xeroform gauze ([place] [phone]) to wound bed daily.  Apply floor stock (pink top) moisturizer to rest of intact skin of left arm.  Wrap with Kerlix roll gauze and secure with Ace bandage which will help protect skin from further damage.   POC discussed with bedside nurse. WOC team will not follow. Re-consult if further needs arise.   Thank you,    Powell Bar MSN, RN-BC, [employer]

## 2024-03-07 DIAGNOSIS — L03114 Cellulitis of left upper limb: Secondary | ICD-10-CM | POA: Diagnosis not present

## 2024-03-07 LAB — BLOOD GAS, VENOUS
Acid-Base Excess: 16.5 mmol/L — ABNORMAL HIGH (ref 0.0–2.0)
Bicarbonate: 43.7 mmol/L — ABNORMAL HIGH (ref 20.0–28.0)
Drawn by: 8232
O2 Saturation: 77.6 %
Patient temperature: 36.8
pCO2, Ven: 68 mmHg — ABNORMAL HIGH (ref 44–60)
pH, Ven: 7.41 (ref 7.25–7.43)
pO2, Ven: 45 mmHg (ref 32–45)

## 2024-03-07 LAB — GLUCOSE, CAPILLARY: Glucose-Capillary: 109 mg/dL — ABNORMAL HIGH (ref 70–99)

## 2024-03-07 MED ORDER — LORATADINE 10 MG PO TABS
10.0000 mg | ORAL_TABLET | Freq: Every day | ORAL | Status: DC
  Administered 2024-03-07 – 2024-03-08 (×2): 10 mg via ORAL
  Filled 2024-03-07 (×2): qty 1

## 2024-03-07 NOTE — Progress Notes (Signed)
 PROGRESS NOTE Francisco [name]  FMW:[id number] DOB: 1946-04-27 DOA: 03/04/2024 PCP: [name], MD  Brief Narrative/Hospital Course: 78 year old male with PMH end-stage COPD, chronic hypoxic respiratory failure on 5 L oxygen at home, lung cancer, skin cancer, PAD, HLD, HTN, history of PE on Eliquis .  He had recent surgery on his left hand to remove a skin cancer by dermatology on 02/09/2024.  On 02/28/2024 he noticed his hand was developing redness, pain, swelling.  He initially was started on Bactrim  after talking to pulmonology as he was also being started on prednisone  for a COPD exacerbation. After 2 days of Bactrim  he felt he was having a reaction and was therefore changed to doxycycline .  He started doxycycline  on 03/02/2024.  Due to feeling that there was no improvement in his hand, he presented for further evaluation. He was started on vancomycin  and Rocephin .  The morning following admission he had significant improvement in the redness and tenderness in his hand.  Subjective: Seen and examined Wife at the bedside Overnight remains afebrile, used BiPAP during night Extensive bruising to his upper extremities -now cleansed and dressed along with Kerlix/Ace bandage Good strength in his left wrist although has difficulty with his left forefinger with able to feel sensation able to close defeatist no motor weakness-reports she has trouble putting it back in does have tremors in fingers, checking VBG Patient asking if his antibiotics could be changed TO PO  Assessment and plan:  Left hand cellulitis prior Moh's surgery on 02/09/2024 that section has well-healed Upper extremity swelling/easy bruisability: symptoms started multiple weeks after surgery- had 2 days of Bactrim  followed by doxycycline  for about 3 days PTA. Labs showed leukocytosis, blood culture 8/28 NGTD.  X-ray left hand -mild diffuse soft tissue swelling no acute finding managed with vancomycin /Rocephin  with good response  but has extensive bruise/thin skin, also edema, dark hemorrhagic tissue > wound care consulted cleaned and now with Kerlix and Ace wrap.  He is on Lasix  as well Of the arm elevated.  Afebrile overnight discussed antibiotics tomorrow   Stage IV COPD/end-stage COPD on chronic steroid Acute exacerbation of COPD Chronic hypoxic and hypercapnic respiratory failure ON 5l [place] @ HOME Patient hypoxic at 83% 6 L needing HFNC-chest x-ray with chronic fibrosis Blood gas shows acute on chronic CO2 retention in 90s-improved with BiPAP. despite being hypercapneic, was mentating well.  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  Patient is actively following [facility] oncology for management.   History of PE: Continue Eliquis  2.5 mg twice daily   History of chronic sinus tachycardia History of chronic sinus tachycardia in the setting of end-stage COPD. Vitals stable.  Continue IV Lopressor  as needed   History of essential hypertension cont Lasix  20 mg daily.   PVD: Continue Crestor   CODE STATUS: Patient is DNR, he is high risk for readmissions decompensation Overall prognosis not bright at least on longer on  Mobility: Mobility: PT Orders: Active  PT Follow up Rec: Home Health Pt8/30/2025 1118    DVT prophylaxis: apixaban  (ELIQUIS ) tablet 2.5 mg Start: 03/04/24 2200 SCDs Start: 03/04/24 2043 Place TED hose Start: 03/04/24 2043 Code Status:   Code Status: Limited: Do not attempt resuscitation (DNR) -DNR-LIMITED -  Do Not Intubate/DNI  Family Communication: plan of care discussed with patient/wife daughter at bedside. Patient status is: Remains hospitalized because of severity of  illness Level of care: Progressive   Dispo: [name] w/ family            Anticipated disposition: Anticipate discharge 1-2 days  Objective: Vitals last 24 hrs: Vitals:   03/07/24 0509 03/07/24 0812 03/07/24 0816 03/07/24 0941  BP: (!) 164/95   123/73  Pulse: 82   (!) 101  Resp: 20     Temp: (!) 97.4 F (36.3 C)     TempSrc: Oral     SpO2: 100% 98% 99% 97%  Weight:      Height:        Physical Examination: General exam:Alert awake, obesem pleasant, older than stated age HEENT:Oral mucosa moist, Ear/Nose WNL grossly Respiratory system: B/L clear BS,no use of accessory muscle Cardiovascular system: S1 & S2 +, No JVD. Gastrointestinal system:Abdomen soft,NT,ND, BS+ Nervous System:Alert, awake, moving all extremities,and following commands. Extremities: Easy bruisability, significant bruising and edema upper extremities-now under Kerlix /ace bandage Skin:No rashes,no icterus. FDX:Wnmfjo muscle bulk,tone, power   Medications reviewed:  Scheduled Meds:  apixaban   2.5 mg Oral BID   docusate sodium   200 mg Oral BID   Ensifentrine   3 mg Inhalation BID   feeding supplement  237 mL Oral BID BM   ferrous sulfate   325 mg Oral QODAY   fluticasone   1 spray Each Nare Daily   fluticasone  furoate-vilanterol  1 puff Inhalation Daily   folic acid   1 mg Oral Daily   furosemide   20 mg Oral Daily   guaiFENesin   600 mg Oral BID   ipratropium-albuterol   3 mL Nebulization TID   loratadine   10 mg Oral Daily   methylPREDNISolone  (SOLU-MEDROL ) injection  40 mg Intravenous Q12H   pantoprazole   20 mg Oral Daily   rosuvastatin   40 mg Oral Daily   sodium chloride  flush  3 mL Intravenous Q12H   umeclidinium bromide   1 puff Inhalation Daily   Continuous Infusions:  cefTRIAXone  (ROCEPHIN )  IV Stopped (03/06/24 2222)   vancomycin  1,250 mg (03/07/24 0951)   Diet: Diet Order             Diet Heart Room service appropriate? Yes; Fluid consistency: Thin  Diet effective now                     Data Reviewed: I have personally reviewed following labs and imaging studies ( see epic result tab) CBC: Recent Labs  Lab 03/04/24 1527 03/05/24 0407 03/06/24 0859  WBC 17.4* 9.8 10.0  NEUTROABS 15.3*  --  8.8*  HGB 11.2* 10.7* 10.1*  HCT 36.1* 35.3* 32.8*  MCV 101.4* 102.6* 101.2*  PLT 176 170 167   CMP: Recent Labs  Lab 03/04/24 1444 03/05/24 0407 03/06/24 0859  NA 139 138 142  K 4.9 5.1 4.1  CL 92* 91* 94*  CO2 36* 35* 36*  GLUCOSE 102* 133* 100*  BUN 16 21 21   CREATININE 1.11 0.96 0.74  CALCIUM  10.0 9.2 8.6*  MG  --   --  2.3   GFR: Estimated Creatinine Clearance: 77.3 mL/min (by C-G formula based on SCr of 0.74 mg/dL). Recent Labs  Lab 03/05/24 0407  AST 24  ALT 12  ALKPHOS 67  BILITOT 0.2  PROT 6.6  ALBUMIN  3.8   No results for input(s): LIPASE, AMYLASE in the last 168 hours. No results for input(s): AMMONIA  in the last 168 hours. Coagulation Profile: No results for input(s): INR, PROTIME in the last 168 hours. Unresulted Labs (From admission, onward)     Start     Ordered   03/08/24 0500  Basic metabolic panel with GFR  Daily,   R     Question:  Specimen collection method  Answer:  Lab=Lab collect   03/06/24 1144   03/08/24 0500  CBC with Differential/Platelet  Daily,   R     Question:  Specimen collection method  Answer:  Lab=Lab collect   03/06/24 1144   03/07/24 1052  Blood gas, venous  Once,   R       Question:  Specimen collection method  Answer:  Lab=Lab collect   03/07/24 1051           Antimicrobials/Microbiology: Anti-infectives (From admission, onward)    Start     Dose/Rate Route Frequency Ordered Stop   03/05/24 1800  vancomycin  (VANCOREADY) IVPB 1250 mg/250 mL  Status:  Discontinued        1,250 mg 166.7 mL/hr over 90 Minutes Intravenous Every 24 hours 03/04/24 2111 03/05/24 1420   03/05/24 1515  vancomycin  (VANCOREADY) IVPB 1250 mg/250 mL        1,250 mg 166.7 mL/hr over 90 Minutes Intravenous Daily  03/05/24 1420     03/04/24 2200  cefTRIAXone  (ROCEPHIN ) 2 g in sodium chloride  0.9 % 100 mL IVPB        2 g 200 mL/hr over 30 Minutes Intravenous Every 24 hours 03/04/24 2042     03/04/24 1700  vancomycin  (VANCOCIN ) IVPB 1000 mg/200 mL premix       Placed in Followed by Linked Group   1,000 mg 200 mL/hr over 60 Minutes Intravenous  Once 03/04/24 1659 03/04/24 1848   03/04/24 1700  vancomycin  (VANCOCIN ) 500 mg in sodium chloride  0.9 % 100 mL IVPB       Placed in Followed by Linked Group   500 mg 100 mL/hr over 60 Minutes Intravenous  Once 03/04/24 1659 03/06/24 0050         Component Value Date/Time   SDES  03/04/2024 2125    BLOOD RIGHT HAND Performed at [facility], [address], [place], [place] [address]    SPECREQUEST  03/04/2024 2125    BOTTLES DRAWN AEROBIC AND ANAEROBIC Blood Culture results may not be optimal due to an inadequate volume of blood received in culture bottles Performed at [facility], [address], [place], [place] [address]    CULT  03/04/2024 2125    NO GROWTH 2 DAYS Performed at [facility], [address], [place], [place] [address]    REPTSTATUS PENDING 03/04/2024 2125    Procedures:    Mennie LAMY, MD [facility] 03/07/2024, 10:51 AM

## 2024-03-07 NOTE — Progress Notes (Signed)
   03/06/24 2200  BiPAP/CPAP/SIPAP  BiPAP/CPAP/SIPAP Pt Type Adult  BiPAP/CPAP/SIPAP SERVO (air)  Mask Type Full face mask  Set Rate 16 breaths/min  Respiratory Rate 19 breaths/min  IPAP 15 cmH20 (set PC of 10)  EPAP 5 cmH2O  PEEP 5 cmH20  Flow Rate 10 lpm (15/5)  Minute Ventilation 10.6  Leak 64  Peak Inspiratory Pressure (PIP) 14  Tidal Volume (Vt) 765  Patient Home Machine No  Patient Home Mask No  Patient Home Tubing No  Auto Titrate No  Press High Alarm 30 cmH2O  CPAP/SIPAP surface wiped down Yes  Device Plugged into RED Power Outlet Yes  Oxygen Percent 40 %  BiPAP/CPAP /SiPAP Vitals  Resp 16  MEWS Score/Color  MEWS Score 1  MEWS Score Color Green

## 2024-03-08 DIAGNOSIS — L03114 Cellulitis of left upper limb: Secondary | ICD-10-CM | POA: Diagnosis not present

## 2024-03-08 DIAGNOSIS — J449 Chronic obstructive pulmonary disease, unspecified: Secondary | ICD-10-CM | POA: Diagnosis not present

## 2024-03-08 DIAGNOSIS — J9611 Chronic respiratory failure with hypoxia: Secondary | ICD-10-CM | POA: Diagnosis not present

## 2024-03-08 LAB — CBC WITH DIFFERENTIAL/PLATELET
Abs Immature Granulocytes: 0.06 K/uL (ref 0.00–0.07)
Basophils Absolute: 0 K/uL (ref 0.0–0.1)
Basophils Relative: 0 %
Eosinophils Absolute: 0 K/uL (ref 0.0–0.5)
Eosinophils Relative: 0 %
HCT: 30.6 % — ABNORMAL LOW (ref 39.0–52.0)
Hemoglobin: 9.3 g/dL — ABNORMAL LOW (ref 13.0–17.0)
Immature Granulocytes: 1 %
Lymphocytes Relative: 7 %
Lymphs Abs: 0.5 K/uL — ABNORMAL LOW (ref 0.7–4.0)
MCH: 31.1 pg (ref 26.0–34.0)
MCHC: 30.4 g/dL (ref 30.0–36.0)
MCV: 102.3 fL — ABNORMAL HIGH (ref 80.0–100.0)
Monocytes Absolute: 0.7 K/uL (ref 0.1–1.0)
Monocytes Relative: 9 %
Neutro Abs: 6.5 K/uL (ref 1.7–7.7)
Neutrophils Relative %: 83 %
Platelets: 147 K/uL — ABNORMAL LOW (ref 150–400)
RBC: 2.99 MIL/uL — ABNORMAL LOW (ref 4.22–5.81)
RDW: 12.6 % (ref 11.5–15.5)
WBC: 7.8 K/uL (ref 4.0–10.5)
nRBC: 0 % (ref 0.0–0.2)

## 2024-03-08 LAB — BASIC METABOLIC PANEL WITH GFR
Anion gap: 10 (ref 5–15)
BUN: 20 mg/dL (ref 8–23)
CO2: 35 mmol/L — ABNORMAL HIGH (ref 22–32)
Calcium: 8.4 mg/dL — ABNORMAL LOW (ref 8.9–10.3)
Chloride: 97 mmol/L — ABNORMAL LOW (ref 98–111)
Creatinine, Ser: 0.68 mg/dL (ref 0.61–1.24)
GFR, Estimated: >60 mL/min (ref >60)
Glucose, Bld: 119 mg/dL — ABNORMAL HIGH (ref 70–99)
Potassium: 4.4 mmol/L (ref 3.5–5.1)
Sodium: 142 mmol/L (ref 135–145)

## 2024-03-08 MED ORDER — DOXYCYCLINE HYCLATE 100 MG PO TABS: 100.0000 mg | ORAL_TABLET | Freq: Two times a day (BID) | ORAL | 0 refills | Status: AC

## 2024-03-08 MED ORDER — CALCIUM GLUCONATE-NACL 2-0.675 GM/100ML-% IV SOLN
2.0000 g | INTRAVENOUS | Status: AC
  Administered 2024-03-08: 2000 mg via INTRAVENOUS
  Filled 2024-03-08: qty 100

## 2024-03-08 MED ORDER — DOXYCYCLINE HYCLATE 100 MG PO TABS
100.0000 mg | ORAL_TABLET | Freq: Two times a day (BID) | ORAL | Status: DC
  Administered 2024-03-08: 100 mg via ORAL
  Filled 2024-03-08: qty 1

## 2024-03-08 NOTE — TOC Transition Note (Signed)
 Transition of Care [facility]) - Discharge Note   Patient Details  Name: Francisco [name] MRN: [id number] Date of Birth: 11-10-1945  Transition of Care [facility]) CM/SW Contact:  Tawni CHRISTELLA Eva, LCSW Phone Number: 03/08/2024, 12:34 PM   Clinical Narrative:     CSW spoke with pt's spouse to discuss rec for home health services. She is agreeable and would like [facility]. Referral was sent , [facility] was able to accept pt for HHPT/RN. No further IPCM needs IP Care management sign off.   Final next level of care: Home w Home Health Services Barriers to Discharge: Barriers Resolved   Patient Goals and CMS Choice Patient states their goals for this hospitalization and ongoing recovery are:: retunr home with home health CMS Medicare.gov Compare Post Acute Care list provided to:: Patient Represenative (must comment) Choice offered to / list presented to : Spouse      Discharge Placement                    Patient and family notified of of transfer: 03/08/24  Discharge Plan and Services Additional resources added to the After Visit Summary for   In-house Referral: Clinical Social Work              DME Arranged: N/A DME Agency: NA       HH Arranged: Charity fundraiser, PT   Date HH Agency Contacted: 03/08/24   Representative spoke with at [facility] Agency: [place]  Social Drivers of Health (SDOH) Interventions SDOH Screenings   Food Insecurity: No Food Insecurity (03/04/2024)  Housing: Low Risk  (03/04/2024)  Transportation Needs: No Transportation Needs (03/04/2024)  Utilities: Not At Risk (03/04/2024)  Alcohol  Screen: Low Risk  (02/03/2023)  Depression (PHQ2-9): Low Risk  (03/03/2023)  Financial Resource Strain: Low Risk  (02/03/2023)  Physical Activity: Unknown (02/03/2023)  Social Connections: Moderately Isolated (03/04/2024)  Stress: No Stress Concern Present (02/03/2023)  Tobacco Use: Medium Risk (03/04/2024)     Readmission Risk Interventions    03/05/2024   10:15 AM 07/14/2023    2:38  PM 08/13/2022    1:46 PM  Readmission Risk Prevention Plan  Transportation Screening Complete Complete Complete  PCP or Specialist Appt within 5-7 Days   Complete  PCP or Specialist Appt within 3-5 Days  Complete   Home Care Screening Complete  Complete  Medication Review (RN CM) Complete  Complete  HRI or Home Care Consult  Complete   Social Work Consult for Recovery Care Planning/Counseling  Complete   Palliative Care Screening  Not Applicable   Medication Review Oceanographer)  Complete

## 2024-03-08 NOTE — Discharge Summary (Signed)
 Physician Discharge Summary   Francisco [name] FMW:[id number] DOB: Jul 25, 1945 DOA: 03/04/2024  PCP: [name], MD  Admit date: 03/04/2024 Discharge date: 03/08/2024  Admitted From: Home Disposition:  Home Discharging physician: Alm Apo, MD Barriers to discharge: none  Recommendations at discharge: Follow up with pulmonology as planned  Discharge Condition: stable CODE STATUS: DNR Diet recommendation:  Diet Orders (From admission, onward)     Start     Ordered   03/07/24 1144  Diet regular Room service appropriate? Yes; Fluid consistency: Thin  Diet effective now       Question Answer Comment  Room service appropriate? Yes   Fluid consistency: Thin      03/07/24 1143            Hospital Course: Francisco [name] is a 78 year old male with PMH end-stage COPD, chronic hypoxic respiratory failure on 5 L oxygen at home, lung cancer, skin cancer, PAD, HLD, HTN, history of PE on Eliquis .  He had recent surgery on his left hand to remove a skin cancer by dermatology on 02/09/2024.  On 02/28/2024 he noticed his hand was developing redness, pain, swelling.  He initially was started on Bactrim  after talking to pulmonology as he was also being started on prednisone  for a COPD exacerbation.  After 2 days of Bactrim  he felt he was having a reaction and was therefore changed to doxycycline .  He started doxycycline  on 03/02/2024.  Due to feeling that there was no improvement in his hand, he presented for further evaluation.   He was started on vancomycin  and Rocephin .  The morning following admission he had significant improvement in the redness and tenderness in his hand.     Assessment and Plan: Left hand cellulitis - Presented with worsening left hand erythema, tenderness, edema; do not think this is related to prior Moh's surgery from 02/09/2024 as that section of his hand is well-healed and symptoms started multiple weeks after surgery - 2 days of Bactrim  followed by doxycycline   for about 3 days prior to admission - Responded well to vancomycin  and Rocephin ; transitioned to doxycycline  to complete course at discharge - No further need for imaging   Stage IV COPD/end-stage COPD COPD exacerbation Chronic hypoxic respiratory failure 5 L oxygen at baseline -In the ED O2 sat dropped to 83% on 6 L oxygen requiring high flow oxygen and chest x-ray showing pulmonary fibrosis without any evidence of consolidation. -In the ED patient has been treated with Solu-Medrol  125 mg. At home patient takes prednisone  10 mg daily. 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  Patient is actively following [facility] health oncology for management.   History of pulmonary embolism -Continue Eliquis  2.5 mg twice daily   History of chronic sinus tachycardia History of chronic sinus tachycardia in the setting of end-stage COPD. - Heart rate controlled at discharge   History of essential hypertension -At home patient takes Lasix  20 mg daily.   History of peripheral artery disease -Continue Crestor   The patient's acute and chronic medical conditions were treated accordingly. On day of discharge, patient was felt deemed stable for discharge. Patient/family member advised to call PCP or come back to ER if needed.  Principal Diagnosis: Cellulitis of left hand  Discharge Diagnoses: Active Hospital Problems   Diagnosis Date Noted   Cellulitis of left hand 03/04/2024    Priority: 1.   Chronic hypoxic respiratory failure (HCC) 09/23/2017    Priority: 1.   End stage COPD (HCC) 11/27/2018    Priority: 2.   History of lung cancer 03/04/2024   Hx of sinus tachycardia 03/04/2024    History of pulmonary embolism 03/04/2024   Peripheral artery disease (HCC) 12/11/2018   Essential hypertension 11/02/2017   Hyperlipidemia 07/22/2014    Resolved Hospital Problems  No resolved problems to display.     Discharge Instructions     Discharge wound care:   Complete by: As directed    Cleanse L arm area of bruising/wound with Vashe wound cleanser, allow to air dry. Apply Xeroform gauze to wound bed daily. Wrap with Kerlix roll gauze and secure with Ace bandage which will help protect skin from further damage.   Increase activity slowly   Complete by: As directed       Allergies as of 03/08/2024       Reactions   Tape Other (See Comments)   Skin tears easily   Augmentin  [amoxicillin -pot Clavulanate] Other (See Comments)   Thrush Sore throat Hoarse voice   Bactrim  [sulfamethoxazole -trimethoprim ] Nausea Only   Also feels weak   Ciprofloxacin  Other (See Comments)   hallucinations   Gadavist  [gadobutrol ] Other (See Comments)   Unknown reaction   Gadolinium Itching, Rash, Other (See Comments)   Itchy back rash   Lipitor [atorvastatin ] Rash        Medication List     TAKE these medications    acetaminophen  500 MG tablet Commonly known as: TYLENOL  Take 500 mg by mouth every 4 (four) hours as needed for moderate pain, fever, headache or mild pain.   albuterol  108 (90 Base) MCG/ACT inhaler Commonly known as: VENTOLIN  HFA Inhale 2 puffs into the lungs every 6 (six) hours as needed for wheezing or shortness of breath.   azithromycin  250 MG tablet Commonly known as: ZITHROMAX  TAKE 1 TABLET BY MOUTH 3 TIMES  WEEKLY   CALCIUM  + D3 PO Take 1 tablet by mouth daily with breakfast.   docusate sodium  100 MG capsule Commonly known as: COLACE Take 2 capsules (200 mg total) by mouth 2 (two) times daily.   doxycycline  100 MG tablet Commonly known as: VIBRA -TABS Take 1 tablet (100 mg total) by mouth every 12 (twelve) hours for 2 days. What changed: when to take  this   Eliquis  2.5 MG Tabs tablet Generic drug: apixaban  TAKE 1 TABLET BY MOUTH TWICE  DAILY   fexofenadine 180 MG tablet Commonly known as: ALLEGRA Take 180 mg by mouth daily.   fluticasone  50 MCG/ACT nasal spray Commonly known as: FLONASE  USE 2 SPRAYS IN BOTH NOSTRILS  DAILY   folic acid  1 MG tablet Commonly known as: FOLVITE  Take 1 tablet (1 mg total) by mouth daily.   furosemide  20 MG tablet Commonly known as: LASIX  TAKE 1 TABLET BY MOUTH DAILY AS  NEEDED FOR FLUID OR EDEMA   ipratropium-albuterol  0.5-2.5 (3) MG/3ML Soln Commonly known as: DUONEB TAKE 3 MLS BY NEBULIZATION 3 (THREE) TIMES DAILY.   Iron  325 (65 Fe) MG Tabs Take 325 mg by mouth See admin instructions. Take 325 mg by mouth with food every other day   MUCINEX  PO Take 1,200 mg by mouth daily.   multivitamin with minerals Tabs tablet Take 1 tablet by mouth daily.  mupirocin  ointment 2 % Commonly known as: BACTROBAN  APPLY TOPICALLY TWICE DAILY FOR  SKIN INFECTIONS   Ohtuvayre  3 MG/2.5ML Susp Generic drug: Ensifentrine  INHALE THE CONTENTS OF 1 AMPULE VIA NEBULIZER IN THE MORNING AND IN THE EVENING   OXYGEN Inhale 5.5 L/min into the lungs continuous.   pantoprazole  20 MG tablet Commonly known as: PROTONIX  TAKE 1 TABLET BY MOUTH DAILY   predniSONE  10 MG tablet Commonly known as: DELTASONE  Take 1 tablet (10 mg total) by mouth daily with breakfast.   rosuvastatin  40 MG tablet Commonly known as: CRESTOR  Take 1 tablet (40 mg total) by mouth daily.   Spiriva  Respimat 2.5 MCG/ACT Aers Generic drug: Tiotropium Bromide  Monohydrate USE 2 INHALATIONS BY MOUTH ONCE  DAILY   Symbicort  160-4.5 MCG/ACT inhaler Generic drug: budesonide -formoterol  INHALE 2 INHALATIONS BY MOUTH  TWICE DAILY   Systane Preservative Free 0.4-0.3 % Soln Generic drug: Polyethyl Glyc-Propyl Glyc PF Place 1 drop into both eyes 2 (two) times daily.   traMADol  50 MG tablet Commonly known as: ULTRAM  Take 50 mg by mouth every  12 (twelve) hours as needed for severe pain.               Discharge Care Instructions  (From admission, onward)           Start     Ordered   03/08/24 0000  Discharge wound care:       Comments: Cleanse L arm area of bruising/wound with Vashe wound cleanser, allow to air dry. Apply Xeroform gauze to wound bed daily. Wrap with Kerlix roll gauze and secure with Ace bandage which will help protect skin from further damage.   03/08/24 1128            Follow-up Information     [name], MD. Schedule an appointment as soon as possible for a visit in 1 week(s).   Specialty: Family Medicine Contact information: [address] [place] [address] [phone]         [facility]  Follow up.   Why: home ehalth will follow up with you after discharge Contact information: [address] [place] [address] [phone]                Allergies  Allergen Reactions   Tape Other (See Comments)    Skin tears easily   Augmentin  [Amoxicillin -Pot Clavulanate] Other (See Comments)    Thrush Sore throat Hoarse voice   Bactrim  [Sulfamethoxazole -Trimethoprim ] Nausea Only    Also feels weak   Ciprofloxacin  Other (See Comments)    hallucinations   Gadavist  [Gadobutrol ] Other (See Comments)    Unknown reaction   Gadolinium Itching, Rash and Other (See Comments)    Itchy back rash   Lipitor [Atorvastatin ] Rash    Consultations:   Procedures:   Discharge Exam: BP 131/72 (BP Location: Right Arm)   Pulse 90   Temp (!) 97.4 F (36.3 C) (Oral)   Resp 20   Ht 6' (1.829 m)   Wt 69.6 kg   SpO2 100%   BMI 20.81 kg/m  Physical Exam Constitutional:      General: He is not in acute distress.    Appearance: Normal appearance.  HENT:     Head: Normocephalic and atraumatic.     Mouth/Throat:     Mouth: Mucous membranes are moist.  Eyes:     Extraocular Movements: Extraocular movements intact.   Cardiovascular:     Rate and Rhythm: Normal rate and regular  rhythm.  Pulmonary:     Effort: Pulmonary effort is normal. No respiratory distress.     Breath sounds: Wheezing (significant improvement) present.     Comments: Coarse breath sounds bilaterally Abdominal:     General: Bowel sounds are normal. There is no distension.     Palpations: Abdomen is soft.     Tenderness: There is no abdominal tenderness.  Musculoskeletal:        General: Normal range of motion.     Cervical back: Normal range of motion and neck supple.  Skin:    General: Skin is warm and dry.     Comments: Diffuse skin lesions throughout with intermittent bruising especially on arms; well healed scar on left hand dorsum with no further signs of active cellulitis   Neurological:     General: No focal deficit present.     Mental Status: He is alert.  Psychiatric:        Mood and Affect: Mood normal.        Behavior: Behavior normal.      The results of significant diagnostics from this hospitalization (including imaging, microbiology, ancillary and laboratory) are listed below for reference.   Microbiology: Recent Results (from the past 240 hours)  Culture, blood (Routine X 2) w Reflex to ID Panel     Status: None (Preliminary result)   Collection Time: 03/04/24  9:21 PM   Specimen: BLOOD RIGHT ARM  Result Value Ref Range Status   Specimen Description   Final    BLOOD RIGHT ARM Performed at [facility], [address], [place], [place] [address]    Special Requests   Final    BOTTLES DRAWN AEROBIC AND ANAEROBIC Blood Culture results may not be optimal due to an inadequate volume of blood received in culture bottles Performed at [facility], [address], [place], [place] [address]    Culture   Final    NO GROWTH 3 DAYS Performed at [facility], [address], [place], [place] [address]    Report Status PENDING  Incomplete  Culture, blood (Routine X 2) w Reflex to  ID Panel     Status: None (Preliminary result)   Collection Time: 03/04/24  9:25 PM   Specimen: BLOOD RIGHT HAND  Result Value Ref Range Status   Specimen Description   Final    BLOOD RIGHT HAND Performed at [facility], [address], [place], [place] [address]    Special Requests   Final    BOTTLES DRAWN AEROBIC AND ANAEROBIC Blood Culture results may not be optimal due to an inadequate volume of blood received in culture bottles Performed at [facility], [address], [place], [place] [address]    Culture   Final    NO GROWTH 3 DAYS Performed at [facility], [address], [place], [place] [address]    Report Status PENDING  Incomplete     Labs: BNP (last 3 results) Recent Labs    07/11/23 0639  BNP 221.1*   Basic Metabolic Panel: Recent Labs  Lab 03/04/24 1444 03/05/24 0407 03/06/24 0859 03/08/24 0409  NA 139 138 142 142  K 4.9 5.1 4.1 4.4  CL 92* 91* 94* 97*  CO2 36* 35* 36* 35*  GLUCOSE 102* 133* 100* 119*  BUN 16 21 21 20   CREATININE 1.11 0.96 0.74 0.68  CALCIUM  10.0 9.2 8.6* 8.4*  MG  --   --  2.3  --  Liver Function Tests: Recent Labs  Lab 03/05/24 0407  AST 24  ALT 12  ALKPHOS 67  BILITOT 0.2  PROT 6.6  ALBUMIN  3.8   No results for input(s): LIPASE, AMYLASE in the last 168 hours. No results for input(s): AMMONIA in the last 168 hours. CBC: Recent Labs  Lab 03/04/24 1527 03/05/24 0407 03/06/24 0859 03/08/24 0409  WBC 17.4* 9.8 10.0 7.8  NEUTROABS 15.3*  --  8.8* 6.5  HGB 11.2* 10.7* 10.1* 9.3*  HCT 36.1* 35.3* 32.8* 30.6*  MCV 101.4* 102.6* 101.2* 102.3*  PLT 176 170 167 147*   Cardiac Enzymes: No results for input(s): CKTOTAL, CKMB, CKMBINDEX, TROPONINI in the last 168 hours. BNP: Invalid input(s): POCBNP CBG: Recent Labs  Lab 03/07/24 0801  GLUCAP 109*   D-Dimer No results for input(s): DDIMER in the last 72 hours. Hgb A1c No results for input(s): HGBA1C in the  last 72 hours. Lipid Profile No results for input(s): CHOL, HDL, LDLCALC, TRIG, CHOLHDL, LDLDIRECT in the last 72 hours. Thyroid  function studies No results for input(s): TSH, T4TOTAL, T3FREE, THYROIDAB in the last 72 hours.  Invalid input(s): FREET3 Anemia work up No results for input(s): VITAMINB12, FOLATE, FERRITIN, TIBC, IRON , RETICCTPCT in the last 72 hours. Urinalysis    Component Value Date/Time   COLORURINE YELLOW 02/21/2023 0710   APPEARANCEUR CLEAR 02/21/2023 0710   LABSPEC 1.012 02/21/2023 0710   PHURINE 7.0 02/21/2023 0710   GLUCOSEU NEGATIVE 02/21/2023 0710   GLUCOSEU NEGATIVE 06/25/2017 0831   HGBUR NEGATIVE 02/21/2023 0710   HGBUR trace-lysed 02/12/2010 0819   BILIRUBINUR NEGATIVE 02/21/2023 0710   BILIRUBINUR Negative 12/25/2017 0852   KETONESUR 5 (A) 02/21/2023 0710   PROTEINUR NEGATIVE 02/21/2023 0710   UROBILINOGEN 0.2 12/25/2017 0852   UROBILINOGEN 0.2 06/25/2017 0831   NITRITE NEGATIVE 02/21/2023 0710   LEUKOCYTESUR NEGATIVE 02/21/2023 0710   Sepsis Labs Recent Labs  Lab 03/04/24 1527 03/05/24 0407 03/06/24 0859 03/08/24 0409  WBC 17.4* 9.8 10.0 7.8   Microbiology Recent Results (from the past 240 hours)  Culture, blood (Routine X 2) w Reflex to ID Panel     Status: None (Preliminary result)   Collection Time: 03/04/24  9:21 PM   Specimen: BLOOD RIGHT ARM  Result Value Ref Range Status   Specimen Description   Final    BLOOD RIGHT ARM Performed at [facility], [address] [place], [place] [address]    Special Requests   Final    BOTTLES DRAWN AEROBIC AND ANAEROBIC Blood Culture results may not be optimal due to an inadequate volume of blood received in culture bottles Performed at [facility], [address], [place], [place] [address]    Culture   Final    NO GROWTH 3 DAYS Performed at [facility], [address], [place], [place] [address]    Report Status PENDING   Incomplete  Culture, blood (Routine X 2) w Reflex to ID Panel     Status: None (Preliminary result)   Collection Time: 03/04/24  9:25 PM   Specimen: BLOOD RIGHT HAND  Result Value Ref Range Status   Specimen Description   Final    BLOOD RIGHT HAND Performed at [facility], [address] [place], [place] [address]    Special Requests   Final    BOTTLES DRAWN AEROBIC AND ANAEROBIC Blood Culture results may not be optimal due to an inadequate volume of blood received in culture bottles Performed at [facility], [address] [place], [place] [address]    Culture   Final    NO GROWTH 3 DAYS Performed at [facility], [address] [place], [place] [address]    Report Status PENDING  Incomplete    Procedures/Studies: DG Chest Portable 1 View Result Date: 03/04/2024 CLINICAL DATA:  Shortness of breath. Left hand pain. In stage COPD. EXAM: PORTABLE CHEST 1 VIEW COMPARISON:  07/09/2023 FINDINGS: Shallow inspiration. Scarring in the lung apices and right base with diffuse interstitial pattern throughout the remainder of the lungs. This is similar prior study likely representing chronic interstitial lung disease. No pleural effusion or pneumothorax. Mediastinal contours appear intact. Calcified and tortuous aorta. Degenerative changes in the spine and shoulders. 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  No focal consolidation. Electronically Signed   By: Elsie Gravely M.D.   On: 03/04/2024 15:19   DG Hand Complete Left Result Date: 03/04/2024 CLINICAL DATA:  Left hand pain. EXAM: LEFT HAND - COMPLETE 3+ VIEW COMPARISON:  None Available. FINDINGS: There is no acute fracture or dislocation. The bones are well mineralized. No arthritic changes. Mild diffuse soft tissue swelling. No radiopaque foreign object or soft tissue gas. IMPRESSION: 1. No acute fracture or dislocation. 2. Mild diffuse soft tissue swelling. Electronically Signed   By: Vanetta Chou M.D.    On: 03/04/2024 15:18     Time coordinating discharge: Over 30 minutes    Alm Apo, MD  [facility] 03/08/2024, 4:24 PM

## 2024-03-08 NOTE — Progress Notes (Signed)
   03/07/24 2332  BiPAP/CPAP/SIPAP  BiPAP/CPAP/SIPAP Pt Type Adult  BiPAP/CPAP/SIPAP SERVO (AIR)  Mask Type Full face mask  Dentures removed? Not applicable  Mask Size Large  Set Rate 16 breaths/min  Respiratory Rate 20 breaths/min  IPAP 15 cmH20 (PC of 10)  EPAP 5 cmH2O  PEEP 5 cmH20  FiO2 (%) 40 %  Minute Ventilation 12.6  Leak 43  Peak Inspiratory Pressure (PIP) 14  Tidal Volume (Vt) 672  Patient Home Machine No  Patient Home Mask No  Patient Home Tubing No  Auto Titrate No  Press High Alarm 30 cmH2O  CPAP/SIPAP surface wiped down Yes  Device Plugged into RED Power Outlet Yes  Oxygen Percent 40 %  BiPAP/CPAP /SiPAP Vitals  Resp 13  MEWS Score/Color  MEWS Score 2  MEWS Score Color Yellow

## 2024-03-08 NOTE — Discharge Instructions (Addendum)
 Resume your prednisone  taper at home if any left, otherwise resume home prednisone  dosing.

## 2024-03-09 ENCOUNTER — Encounter: Payer: Self-pay | Admitting: Family Medicine

## 2024-03-09 ENCOUNTER — Telehealth: Payer: Self-pay

## 2024-03-09 NOTE — Transitions of Care (Post Inpatient/ED Visit) (Signed)
 03/09/2024  Name: Francisco [name] MRN: [id number] DOB: 04-16-1946  Today's TOC FU Call Status: Today's TOC FU Call Status:: Successful TOC FU Call Completed TOC FU Call Complete Date: 03/09/24 Patient's Name and Date of Birth confirmed.  Transition Care Management Follow-up Telephone Call Date of Discharge: 03/08/24 Discharge Facility: [facility]) Type of Discharge: Inpatient Admission Primary Inpatient Discharge Diagnosis:: Cellulitis of left hand How have you been since you were released from the hospital?: Better Any questions or concerns?: No  Items Reviewed: Did you receive and understand the discharge instructions provided?: Yes Medications obtained,verified, and reconciled?: Yes (Medications Reviewed) Any new allergies since your discharge?: No Dietary orders reviewed?: Yes Type of Diet Ordered:: low sodium heart health Do you have support at home?: Yes People in Home [RPT]: spouse Name of Support/Comfort Primary Source: [place]  Medications Reviewed Today: Medications Reviewed Today     Reviewed by Rumalda Alan PENNER, RN (Registered Nurse) on 03/09/24 at 1204  Med List Status: <None>   Medication Order Taking? Sig Documenting Provider Last Dose Status Informant  acetaminophen  (TYLENOL ) 500 MG tablet [id number] Yes Take 500 mg by mouth every 4 (four) hours as needed for moderate pain, fever, headache or mild pain. [provider]  Active Spouse/Significant Other  albuterol  (VENTOLIN  HFA) 108 (90 Base) MCG/ACT inhaler [id number] Yes Inhale 2 puffs into the lungs every 6 (six) hours as needed for wheezing or shortness of breath. [provider]  Active Spouse/Significant Other           Med Note LEONARDO, KIMBERLY L   Thu Mar 04, 2024  4:08 PM) prn  azithromycin  (ZITHROMAX ) 250 MG tablet [id number] Yes TAKE 1 TABLET BY MOUTH 3 TIMES  WEEKLY Geronimo Amel, MD  Active Spouse/Significant Other           Med Note LEONARDO, KIMBERLY L   Thu Mar 04, 2024   4:08 PM) On pause until finish other antibiotics  Calcium  Carb-Cholecalciferol  (CALCIUM  + D3 PO) [id number] Yes Take 1 tablet by mouth daily with breakfast. [provider]  Active Spouse/Significant Other  docusate sodium  (COLACE) 100 MG capsule [id number] Yes Take 2 capsules (200 mg total) by mouth 2 (two) times daily. Elgergawy, Brayton RAMAN, MD  Active Spouse/Significant Other  doxycycline  (VIBRA -TABS) 100 MG tablet [id number] Yes Take 1 tablet (100 mg total) by mouth every 12 (twelve) hours for 2 days. Patsy Lenis, MD  Active   ELIQUIS  2.5 MG TABS tablet [id number] Yes TAKE 1 TABLET BY MOUTH TWICE  DAILY [name], MD  Active Spouse/Significant Other  Ensifentrine  (OHTUVAYRE ) 3 MG/2.5ML SUSP [id number] Yes INHALE THE CONTENTS OF 1 AMPULE VIA NEBULIZER IN THE MORNING AND IN THE KARNA Geronimo, Amel, MD  Active Spouse/Significant Other  Ferrous Sulfate  (IRON ) 325 (65 Fe) MG TABS [id number] Yes Take 325 mg by mouth See admin instructions. Take 325 mg by mouth with food every other day  Patient taking differently: Take 325 mg by mouth See admin instructions. Take 325 mg by mouth with food every other day   [provider]  Active Spouse/Significant Other  fexofenadine (ALLEGRA) 180 MG tablet [id number] Yes Take 180 mg by mouth daily. [provider]  Active Spouse/Significant Other  fluticasone  (FLONASE ) 50 MCG/ACT nasal spray [id number] Yes USE 2 SPRAYS IN BOTH NOSTRILS  DAILY [name], MD  Active Spouse/Significant Other  folic acid  (FOLVITE ) 1 MG tablet [id number] Yes Take 1 tablet (1 mg total) by mouth daily. Mikhail, Maryann, DO  Active Spouse/Significant  Other  furosemide  (LASIX ) 20 MG tablet [id number] Yes TAKE 1 TABLET BY MOUTH DAILY AS  NEEDED FOR FLUID OR EDEMA  Patient taking differently: Take 20 mg by mouth daily.   [name], MD  Active Spouse/Significant Other  guaiFENesin  (MUCINEX  PO) [id number] Yes Take 1,200 mg by mouth daily. [provider]  Active Spouse/Significant Other  ipratropium-albuterol  (DUONEB) 0.5-2.5 (3) MG/3ML SOLN [id number] Yes TAKE 3 MLS BY NEBULIZATION 3 (THREE) TIMES DAILY. Hope Almarie ORN, NP  Active Spouse/Significant Other           Med Note LEONARDO, KIMBERLY L   Thu Mar 04, 2024  4:12 PM) prn  Multiple Vitamin (MULTIVITAMIN WITH MINERALS) TABS tablet [id number] Yes Take 1 tablet by mouth daily. Mikhail, Maryann, DO  Active Spouse/Significant Other  mupirocin  ointment (BACTROBAN ) 2 % [id number] Yes APPLY TOPICALLY TWICE DAILY FOR  SKIN INFECTIONS [name], MD  Active Spouse/Significant Other  OXYGEN [id number] Yes Inhale 5.5 L/min into the lungs continuous. [provider]  Active Spouse/Significant Other  pantoprazole  (PROTONIX ) 20 MG tablet [id number] Yes TAKE 1 TABLET BY MOUTH DAILY [name], MD  Active Spouse/Significant Other  predniSONE  (DELTASONE ) 10 MG tablet [id number] Yes Take 1 tablet (10 mg total) by mouth daily with breakfast. Geronimo Amel, MD  Active Spouse/Significant Other           Med Note LEONARDO, KIMBERLY L   Thu Mar 04, 2024  4:14 PM) Also taking the taper dose  rosuvastatin  (CRESTOR ) 40 MG tablet [id number] Yes Take 1 tablet (40 mg total) by mouth daily. [name], MD  Active Spouse/Significant Other  SPIRIVA  RESPIMAT 2.5 MCG/ACT AERS [id number] Yes USE 2 INHALATIONS BY MOUTH ONCE  DAILY Geronimo Amel, MD  Active Spouse/Significant Other  SYMBICORT  160-4.5 MCG/ACT inhaler [id number] Yes INHALE 2 INHALATIONS BY MOUTH  TWICE DAILY [name], MD  Active Spouse/Significant Other  SYSTANE PRESERVATIVE FREE 0.4-0.3 % SOLN [id number] Yes Place 1 drop into both eyes 2 (two) times daily. [provider]  Active Spouse/Significant Other  traMADol  (ULTRAM ) 50 MG tablet [id number] Yes Take 50 mg by mouth every 12 (twelve) hours as needed for severe pain. [provider]  Active Spouse/Significant Other           Med Note  LEONARDO, KIMBERLY L   Thu Mar 04, 2024  4:15 PM) prn            Home Care and Equipment/Supplies: Were Home Health Services Ordered?: Yes Name of Home Health Agency:: [facility] Has Agency set up a time to come to your home?: No Any new equipment or medical supplies ordered?: No  Functional Questionnaire: Do you need assistance with bathing/showering or dressing?: Yes (wife) Do you need assistance with meal preparation?: Yes (wife) Do you need assistance with eating?: No Do you have difficulty maintaining continence: No Do you need assistance with getting out of bed/getting out of a chair/moving?: Yes (uses lift chair) Do you have difficulty managing or taking your medications?: Yes (wife)  Follow up appointments reviewed: PCP Follow-up appointment confirmed?: Yes Date of PCP follow-up appointment?: 03/11/24 Follow-up Provider: Dr. Katrinka Specialist [facility] [place] Follow-up appointment confirmed?: Yes Date of Specialist follow-up appointment?: 04/12/24 Follow-Up Specialty Provider:: Dr. Geronimo Do you need transportation to your follow-up appointment?: No Do you understand care options if your condition(s) worsen?: Yes-patient verbalized understanding  SDOH Interventions Today    Flowsheet Row Most Recent Value  SDOH Interventions   Food Insecurity Interventions Intervention Not Indicated  Housing Interventions Intervention Not Indicated  Transportation Interventions Intervention Not Indicated  Utilities Interventions Intervention Not Indicated   Wife is caregiver.  Spoke with patient and wife during this call. 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 Reviewed importance of follow up with PCP ( already scheduled) Encouraged wife to continue with management of COPD as prior to hospitalizations.  Oxygen at 5.5 liters, nebs, inhalers, rest , Cpap at night,  Reviewed caregiver stress as wife is full time care giver. Wife is considering getting some hired help. Provided a listen ear. Reviewed and offered 30 day TOC program and wife consented.   Patient Self Care Activities:  Attend all scheduled provider appointments Call provider office for new concerns or questions  Notify RN Care Manager of TOC call rescheduling needs Participate in Transition of Care Program/Attend TOC scheduled calls Take medications as prescribed   Monitor for signs of infection Take antibiotics as prescribed.    Plan:  Telephone follow up appointment with care management team member scheduled for:  03/16/2024 at 1130 with Mliss Creed RN         Alan Ee, RN, BSN, CEN Population Health- Transition of Care Team.  [facility] [phone]

## 2024-03-10 ENCOUNTER — Ambulatory Visit

## 2024-03-10 ENCOUNTER — Encounter (HOSPITAL_COMMUNITY)

## 2024-03-10 ENCOUNTER — Encounter (HOSPITAL_COMMUNITY): Payer: Self-pay

## 2024-03-10 LAB — CULTURE, BLOOD (ROUTINE X 2)
Culture: NO GROWTH
Culture: NO GROWTH

## 2024-03-11 ENCOUNTER — Ambulatory Visit: Admitting: Family Medicine

## 2024-03-11 ENCOUNTER — Encounter: Payer: Self-pay | Admitting: Family Medicine

## 2024-03-11 VITALS — BP 112/78 | HR 107 | Temp 98.4°F | Ht 72.0 in | Wt 164.0 lb

## 2024-03-11 DIAGNOSIS — I2782 Chronic pulmonary embolism: Secondary | ICD-10-CM

## 2024-03-11 DIAGNOSIS — Z23 Encounter for immunization: Secondary | ICD-10-CM

## 2024-03-11 DIAGNOSIS — M24542 Contracture, left hand: Secondary | ICD-10-CM

## 2024-03-11 DIAGNOSIS — I10 Essential (primary) hypertension: Secondary | ICD-10-CM

## 2024-03-11 DIAGNOSIS — L03114 Cellulitis of left upper limb: Secondary | ICD-10-CM

## 2024-03-11 DIAGNOSIS — T148XXA Other injury of unspecified body region, initial encounter: Secondary | ICD-10-CM

## 2024-03-11 DIAGNOSIS — J449 Chronic obstructive pulmonary disease, unspecified: Secondary | ICD-10-CM | POA: Diagnosis not present

## 2024-03-11 DIAGNOSIS — J9611 Chronic respiratory failure with hypoxia: Secondary | ICD-10-CM

## 2024-03-11 NOTE — Assessment & Plan Note (Signed)
 This is also patient's greatest barrier to overall health.  This has been very challenging.  He remains on all of his chronic medications-  azithromycin  250 mg 3 times a week as well as Ohtuvayre  nebulizer as well Spiriva  and Symbicort  and  DuoNebs and keeps follow-up with pulmonology.  He remains on his home oxygen.  He reports significant improvement from recent exacerbation after Solu-Medrol  and antibiotics

## 2024-03-11 NOTE — Assessment & Plan Note (Signed)
 He remains on his home oxygen.

## 2024-03-11 NOTE — Assessment & Plan Note (Signed)
 Appears to have adequately healed after hospitalization and treated with vancomycin  and Rocephin  and finish doxycycline  course outpatient.  Did not tolerate Bactrim  due to muscle weakness initially and when switched to doxycycline  initially had failure to continue to improve likely since doxycycline  more bacteriostatic than bacteriocidal

## 2024-03-11 NOTE — Patient Instructions (Addendum)
 We discussed sports medicine or orthopedic consult about the left 2nd finger- you wanted to hold for now- let me know if you change your mind  The hematoma itself honestly  It looks like at [place] office we have several good options: Dr. Alyse hand surgeon Dr. Delane- sports medicine and ultrasound Dr. Sharl- sports medicine   Recommended follow up: Return for as needed for new, worsening, persistent symptoms.

## 2024-03-11 NOTE — Progress Notes (Signed)
 Phone [phone]   Subjective:  Francisco [name] is a 78 y.o. year old very pleasant male patient who presents for transitional care management and hospital follow up for cellulitis of left hand. Patient was hospitalized from March 04, 2024 to 21st 2025. A TCM phone call was completed on 03/09/2024. Medical complexity moderate  Prior to hospitalization patient had recent surgery on his left hand to remove a skin cancer through dermatology on 02/09/2024.  By February 28, 2024 he noticed worsening redness, pain, swelling.  Pulmonology started him on Bactrim  in addition to the prednisone  they were starting for a COPD exacerbation.  He felt like he was having a reaction to the Bactrim  (muscle weakness ) and was therefore changed to doxycycline  but improvement stagnated  For left hand cellulitis-he was started on vancomycin  and Rocephin  in the hospital and he had significant improvement in redness and tenderness in the hand.  After improvement was able to be transitioned back to doxycycline  to complete course at discharge-he reports he has completed the course   For stage IV COPD/end-stage COPD he was noted to have oxygen saturation dropped to 83% on 6 L oxygen in the emergency department and required high flow oxygen and chest x-ray which showed pulmonary fibrosis without any evidence of consolidation.  He was treated with Solu-Medrol  125 mg up from his baseline prednisone  10 mg.  He did ultimately require BiPAP due to hypercarbia but this improved and he was able to be taken off -Outpatient he is remain on azithromycin  2 and 50 mg 3 times a week as well as Ohtuvayre  nebulizer as well Spiriva  and Symbicort  and  DuoNebs -Outpatient he was transition back to prednisone  10 mg  For history of pulmonary embolism patient was maintained on Eliquis  2.5 mg twice daily without obvious recurrence  Hypertension was maintained with Lasix  20 mg daily and this also adjust his fluid statusand edema  For peripheral  arterial disease he was maintained on Crestor  40 mg though still slightly above ideal goals with LDL of 96 most recently Lab Results  Component Value Date   CHOL 201 (H) 08/21/2023   HDL 91.70 08/21/2023   LDLCALC 96 08/21/2023   LDLDIRECT 72.0 12/07/2020   TRIG 66.0 08/21/2023   CHOLHDL 2 08/21/2023   Labs from hospital reviewed he has chronic anemia which is roughly stable at 9.3 on discharge though fluctuates as high as 10 or even 11 at times.  MCV was elevated at 102-prior issues with alcohol  as limited outlets.  Platelets mildly low at 147.  Offered repeat today and he declines    See problem oriented charting as well  Past Medical History-  Patient Active Problem List   Diagnosis Date Noted   Lumbar compression fracture (HCC) 09/11/2022    Priority: High   Chronic systolic congestive heart failure (HCC) 12/10/2021    Priority: High   Malignant neoplasm of lower lobe of right lung (HCC)     Priority: High   Peripheral artery disease (HCC) 12/11/2018    Priority: High   End stage COPD (HCC) 11/27/2018    Priority: High   DNR (do not resuscitate) 04/21/2018    Priority: High   Chronic pulmonary embolism (HCC) 04/07/2018    Priority: High   Osteoporosis 11/19/2017    Priority: High   Thoracic compression fracture (HCC) 11/02/2017    Priority: High   Former smoker 08/23/2008    Priority: High   Vitamin D  deficiency 03/03/2023    Priority: Medium  Syncope and collapse 02/20/2023    Priority: Medium    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    Priority: Medium    History of upper GI bleeding 04/30/2020    Priority: Medium    Essential tremor 03/31/2018    Priority: Medium    BPPV (benign paroxysmal positional vertigo), right 02/10/2018    Priority: Medium    Essential hypertension 11/02/2017    Priority: Medium    BPH associated with nocturia 06/25/2017    Priority: Medium    Hyperlipidemia 07/22/2014    Priority: Medium    Pre-operative respiratory  examination 12/10/2018    Priority: Low   Venous stasis dermatitis of both lower extremities 11/02/2017    Priority: Low   Chronic hypoxic respiratory failure (HCC) 09/23/2017    Priority: Low   History of skin cancer 05/17/2014    Priority: Low   Chronic rhinitis 10/28/2012    Priority: Low   Pulmonary nodule 09/23/2011    Priority: Low   History of colonic polyps 08/23/2008    Priority: Low   Cellulitis of left hand 03/04/2024   History of lung cancer 03/04/2024   Hx of sinus tachycardia 03/04/2024   History of pulmonary embolism 03/04/2024   Acute on chronic respiratory failure with hypoxia and hypercapnia (HCC) 07/11/2023   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 Current Outpatient Medications  Medication Sig Dispense Refill   acetaminophen  (TYLENOL ) 500 MG tablet Take 500 mg by mouth every 4 (four) hours as needed for moderate pain, fever, headache or mild pain.     albuterol  (VENTOLIN  HFA) 108 (90 Base) MCG/ACT inhaler Inhale 2 puffs into the lungs every 6 (six) hours as needed for wheezing or shortness of breath.     azithromycin  (ZITHROMAX ) 250 MG tablet TAKE 1 TABLET BY MOUTH 3 TIMES  WEEKLY 39 tablet 3   Calcium  Carb-Cholecalciferol  (CALCIUM  + D3 PO) Take 1 tablet by mouth daily with breakfast.     docusate sodium  (COLACE) 100 MG capsule Take 2 capsules (200 mg total) by mouth 2 (two) times daily. 10 capsule 0   ELIQUIS  2.5 MG TABS tablet TAKE  1 TABLET BY MOUTH TWICE  DAILY 180 tablet 3   Ensifentrine  (OHTUVAYRE ) 3 MG/2.5ML SUSP INHALE THE CONTENTS OF 1 AMPULE VIA NEBULIZER IN THE MORNING AND IN THE EVENING 450 mL 1   Ferrous Sulfate  (IRON ) 325 (65 Fe) MG TABS Take 325 mg by mouth See admin instructions. Take 325 mg by mouth with food every other day (Patient taking differently: Take 325 mg by mouth See admin instructions. Take 325 mg by mouth with food every other day)     fexofenadine (ALLEGRA) 180 MG tablet Take 180 mg by mouth daily.     fluticasone  (FLONASE ) 50 MCG/ACT nasal spray USE 2 SPRAYS IN BOTH NOSTRILS  DAILY 48 g 3   folic acid  (FOLVITE ) 1 MG tablet Take 1 tablet (1 mg total) by mouth daily.     furosemide  (LASIX ) 20 MG tablet TAKE 1 TABLET BY MOUTH DAILY AS  NEEDED  FOR FLUID OR EDEMA (Patient taking differently: Take 20 mg by mouth daily.) 90 tablet 3   guaiFENesin  (MUCINEX  PO) Take 1,200 mg by mouth daily.     ipratropium-albuterol  (DUONEB) 0.5-2.5 (3) MG/3ML SOLN TAKE 3 MLS BY NEBULIZATION 3 (THREE) TIMES DAILY. 810 mL 0   Multiple Vitamin (MULTIVITAMIN WITH MINERALS) TABS tablet Take 1 tablet by mouth daily.     mupirocin  ointment (BACTROBAN ) 2 % APPLY TOPICALLY TWICE DAILY FOR  SKIN INFECTIONS 60 g 3   OXYGEN Inhale 5.5 L/min into the lungs continuous.     pantoprazole  (PROTONIX ) 20 MG tablet TAKE 1 TABLET BY MOUTH DAILY 90 tablet 3   predniSONE  (DELTASONE ) 10 MG tablet Take 1 tablet (10 mg total) by mouth daily with breakfast. 90 tablet 3   rosuvastatin  (CRESTOR ) 40 MG tablet Take 1 tablet (40 mg total) by mouth daily. 90 tablet 3   SPIRIVA  RESPIMAT 2.5 MCG/ACT AERS USE 2 INHALATIONS BY MOUTH ONCE  DAILY 12 g 3   SYMBICORT  160-4.5 MCG/ACT inhaler INHALE 2 INHALATIONS BY MOUTH  TWICE DAILY 30.6 g 3   SYSTANE PRESERVATIVE FREE 0.4-0.3 % SOLN Place 1 drop into both eyes 2 (two) times daily.     traMADol  (ULTRAM ) 50 MG tablet Take 50 mg by mouth every 12 (twelve) hours as needed for severe pain.     No current  facility-administered medications for this visit.   Objective  Objective:  BP 112/78 (BP Location: Left Arm, Patient Position: Sitting, Cuff Size: Normal)   Pulse (!) 107   Temp 98.4 F (36.9 C) (Temporal)   Ht 6' (1.829 m)   Wt 164 lb (74.4 kg)   SpO2 96% Comment: on 6 liters  BMI 22.24 kg/m  Gen: NAD, wearing oxygen CV: RRR no murmurs rubs or gallops Lungs: Diffuse wheeze without crackles Ext: trace edema under compression Skin: warm, dry Neuro: Wheelchair   Assessment and Plan:   #TCM/hospital follow up  Assessment & Plan Cellulitis of left hand Appears to have adequately healed after hospitalization and treated with vancomycin  and Rocephin  and finish doxycycline  course outpatient.  Did not tolerate Bactrim  due to muscle weakness initially and when switched to doxycycline  initially had failure to continue to improve likely since doxycycline  more bacteriostatic than bacteriocidal End stage COPD (HCC) This is also patient's greatest barrier to overall health.  This has been very challenging.  He remains on all of his chronic medications-  azithromycin  250 mg 3 times a week as well as Ohtuvayre  nebulizer as well Spiriva  and Symbicort  and  DuoNebs and keeps follow-up with pulmonology.  He remains on his home oxygen.  He reports significant improvement from recent exacerbation after Solu-Medrol  and antibiotics Contracture of finger joint, left Patient appears to have a hematoma about 0.5 cm deep with at least one by one cm breadth on chronic care ultrasound-offered sports medicine or orthopedic consultation but told him I thought this would take time to heal and did not require intervention  He also has a contraction of his second finger and unable to fully extend this-I do not see anything on the extensor compartment side that should explain this-I think the hematoma is so superficial that this should not cause this either.  I recommended especially with his limited mobility and the  importance of his hands orthopedic or sports medicine or hand surgeon consult-he wants to consider options and I gave him several at the [facility] location Essential hypertension Well-controlled on Lasix  20 mg daily and edema is controlled  Other chronic pulmonary embolism, unspecified whether acute cor pulmonale present (HCC) Well-controlled with Eliquis  2.5 mg twice daily-technically this is more of an atrial fibrillation reduced dose than for chronic PE but we want to reduce bleeding risk especially with frailty of his skin Hematoma See discussion above under contracture of left finger Need for influenza vaccination High-dose flu shot given today as she is very high risk with COPD history.  Also recommended new COVID-19 vaccination when available Chronic hypoxic respiratory failure (HCC) He remains on his home oxygen.  Recommended follow up: Return for as needed for new, worsening, persistent symptoms. Future Appointments  Date Time Provider Department Center  03/16/2024 11:30 AM Aura Mliss LABOR, RN [facility] None  04/12/2024 11:30 AM Geronimo Amel, MD [facility] [address]  06/08/2024  1:30 PM [facility] 1 [facility] [place]  06/21/2024  1:30 PM Lanell Donald Stagger, PA-C [facility] None    Lab/Order associations:   ICD-10-CM   1. Cellulitis of left hand  L03.114     2. End stage COPD (HCC)  J44.9     3. Contracture of finger joint, left  M24.542     4. Essential hypertension  I10     5. Other chronic pulmonary embolism, unspecified whether acute cor pulmonale present (HCC)  I27.82     6. Hematoma  T14.8XXA     7. Need for influenza vaccination  Z23 Flu vaccine HIGH DOSE PF(Fluzone Trivalent)    8. Chronic hypoxic respiratory failure (HCC)  J96.11      I personally spent a total of 45 minutes in the care of the patient today including preparing to see the patient including review and summarization of discharge summary, getting/reviewing separately obtained  history, performing a medically appropriate exam/evaluation including point care ultrasound over likely hematoma, counseling and educating, documenting clinical information in the EHR, and coordinating care-primarily discussing de-escalating nonurgent matters such as even skin cancer due to risks to COPD-this is also weighing on them and consideration of seeing sports medicine orthopedics.   Return precautions advised.  Garnette Lukes, MD

## 2024-03-11 NOTE — Assessment & Plan Note (Signed)
 Well-controlled on Lasix  20 mg daily and edema is controlled

## 2024-03-11 NOTE — Assessment & Plan Note (Signed)
 Well-controlled with Eliquis  2.5 mg twice daily-technically this is more of an atrial fibrillation reduced dose than for chronic PE but we want to reduce bleeding risk especially with frailty of his skin

## 2024-03-16 ENCOUNTER — Other Ambulatory Visit: Payer: Self-pay | Admitting: *Deleted

## 2024-03-16 NOTE — Patient Instructions (Signed)
 Visit Information  Thank you for taking time to visit with me today. Please don't hesitate to contact me if I can be of assistance to you before our next scheduled telephone appointment.  Our next appointment is by telephone on 03/23/24 @ 115 pm  Following is a copy of your care plan:   Goals Addressed             This Visit's Progress    VBCI Transitions of Care (TOC) Care Plan       Problems:  Recent Hospitalization for treatment of cellulitis left arm Discharged on 03/08/2024 Chronic end stage COPD ( not reason for admission)  Caregiver stress 03/16/24- spoke with pt who reports  overall doing okay  finished antibiotics, reports left arm started swelling some yesterday  denies any other symptoms, no redness, no warmth, pt is not febrile, pt wants home health nurse to assess tomorrow 9/10 at her visit, does not want to make appointment with primary care provider  Goal:  Over the next 30 days, the patient will not experience hospital readmission  Interventions:  Transitions of Care: Doctor Visits  - discussed the importance of doctor visits Post-op wound/incision care reviewed with patient/caregiver Reviewed Signs and symptoms of infection Reviewed with wife all medications and encouraged patient to finish course of Doxycycline  as prescribed. Reviewed importance of follow up with PCP  Encouraged wife to continue with management of COPD as prior to hospitalizations.  Oxygen at 5.5 liters, nebs, inhalers, rest , Cpap at night,  Reviewed caregiver stress as wife is full time care giver. Wife is considering getting some hired help. Reviewed COPD action plan Reviewed signs/ symptoms cellulitis, reportable signs /symptoms In basket message sent to Dr. Katrinka reporting pt has new swelling to left arm that started yesterday 9/8, home health to assess tomorrow 9/10 and give provider an update   Patient Self Care Activities:  Attend all scheduled provider appointments Call provider  office for new concerns or questions  Notify RN Care Manager of [facility] call rescheduling needs Participate in Transition of Care Program/Attend TOC scheduled calls Take medications as prescribed   Monitor for signs of infection/ cellulitis Report any new symptoms to your doctor Continue working with home health Use oxygen as prescribed, be careful of tubing so you do not trip Follow COPD action plan  Plan:  Telephone follow up appointment with care management team member scheduled for:  03/23/24 @ 115 pm        Patient verbalizes understanding of instructions and care plan provided today and agrees to view in MyChart. Active MyChart status and patient understanding of how to access instructions and care plan via MyChart confirmed with patient.     Telephone follow up appointment with care management team member scheduled for:  03/23/24 @ 115 pm  Please call the care guide team at [phone] if you need to cancel or reschedule your appointment.   Please call the Suicide and Crisis Lifeline: 988 call the [place]  National Suicide Prevention Lifeline: 740 111 4821 or TTY: (618)099-7534 TTY 605-549-1565) to talk to a trained counselor call 1-800-273-TALK (toll free, 24 hour hotline) go to [facility] [address], [place] [phone]) call 911 if you are experiencing a Mental Health or Behavioral Health Crisis or need someone to talk to.  Mliss Creed [facility], BSN RN Care Manager/ Transition of Care [place]/ [facility] [phone]

## 2024-03-16 NOTE — Patient Outreach (Signed)
 Transition of Care week 2  Visit Note  03/16/2024  Name: Francisco [name] MRN: [id number]          DOB: 11-Oct-1945  Situation: Patient enrolled in [facility] 30-day program. Visit completed with patient by telephone.   Background:    Past Medical History:  Diagnosis Date   Acute GI bleeding 02/18/2020   Melena with hemoglobin down to 10 February 2020.  EGD with intervention-follow-up with Dr. Wilhelmenia     Allergy     Blood transfusion without reported diagnosis    CHF (congestive heart failure) (HCC) 2024   [facility] rehab told us    Chronic back pain    mid and lower (04/07/2018)   Chronic rhinitis    -Sinus Ct 08/01/2009 >> Bilateral maxillary sinusitis with some mucosal thickeningin the sphenoid and frontal sinuses as well with air fluid levels present -chronic rhinitis flyer Aug 04, 2009   Clotting disorder [facility]) 2022@2024    Internal bleed   Compressed spine fracture (HCC)    COPD (chronic obstructive pulmonary disease) (HCC)    PFT's rec Jul 17, 2009   Dyspnea    Emphysema lung (HCC)    Emphysema of lung (HCC)    GERD (gastroesophageal reflux disease)    Hyperglycemia 06/12/2016   Hypertension 2/24   Questionable   Iron  deficiency anemia due to chronic blood loss 08/23/2020   Left ankle swelling 07/22/2014   HTN- controlled when fluid status contolled. on triamterene -hctz 37.5-25mg  sparingly  Unclear etiology.   LLE U/S 2016:     Leukocytosis 12/19/2016   Melena 08/11/2022   On home oxygen therapy    3L; 24/7 (04/07/2018)   Onychomycosis    Dr. Naomi   Orthostatic hypotension    since 10/2017 (04/07/2018)   Osteoporosis    Oxygen deficiency    PAD (peripheral artery disease) (HCC)    Pneumonia    twice in 1 year (04/07/2018)   Pulmonary embolism (HCC) 04/07/2018   Skin cancer    lips, face, ears, arms (04/07/2018)   Small cell lung cancer, right (HCC) 2023   Dr. Dewey.  rad   Vertigo    since ~ 02/2018 (04/07/2018)    Assessment: Patient Reported  Symptoms: Cognitive Cognitive Status: No symptoms reported, Alert and oriented to person, place, and time, Normal speech and language skills, Able to follow simple commands      Neurological Neurological Review of Symptoms: No symptoms reported    HEENT HEENT Symptoms Reported: No symptoms reported      Cardiovascular Cardiovascular Symptoms Reported: Swelling in legs or feet Does patient have uncontrolled Hypertension?: No Cardiovascular Management Strategies: Medication therapy Cardiovascular Self-Management Outcome: 4 (good)  Respiratory Respiratory Symptoms Reported: Shortness of breath Other Respiratory Symptoms: end stage COPD, on oxygen at 5.5 liters continuously Additional Respiratory Details: Reviewed COPD action plan Respiratory Management Strategies: Oxygen therapy, Routine screening, CPAP Respiratory Self-Management Outcome: 3 (uncertain)  Endocrine Endocrine Symptoms Reported: No symptoms reported Is patient diabetic?: No    Gastrointestinal Gastrointestinal Symptoms Reported: Other Other Gastrointestinal Symptoms: uses stool softeners regularly, has BM usually QOD Gastrointestinal Management Strategies: Medication therapy Gastrointestinal Self-Management Outcome: 4 (good)    Genitourinary Genitourinary Symptoms Reported: No symptoms reported    Integumentary Integumentary Symptoms Reported: Other Other Integumentary Symptoms: left arm cellulitis, has some swelling in left arm that began yesteray 9/8, pt denies any redness, warmth, pt has not fever Additional Integumentary Details: pt saw primary care provider on 9/4, does not want to schedule another appointment, wants home health  nurse to assess his arm tomorrow at visit on 9/10 and let her contact provider if necessary Skin Comment: RN CM sent in basket message to Dr. Katrinka reporting pt has swelling in left arm that began yesterday 9/8  Musculoskeletal Musculoskelatal Symptoms Reviewed: Difficulty walking Other  Musculoskeletal Symptoms: triggger finger left 2nd finger Musculoskeletal Management Strategies: Medical device, Routine screening Musculoskeletal Self-Management Outcome: 4 (good)      Psychosocial Psychosocial Symptoms Reported: No symptoms reported         There were no vitals filed for this visit.  Medications Reviewed Today     Reviewed by Aura Mliss LABOR, RN (Registered Nurse) on 03/16/24 at 1217  Med List Status: <None>   Medication Order Taking? Sig Documenting Provider Last Dose Status Informant  acetaminophen  (TYLENOL ) 500 MG tablet [id number]  Take 500 mg by mouth every 4 (four) hours as needed for moderate pain, fever, headache or mild pain. [provider]  Active Spouse/Significant Other  albuterol  (VENTOLIN  HFA) 108 (90 Base) MCG/ACT inhaler [id number]  Inhale 2 puffs into the lungs every 6 (six) hours as needed for wheezing or shortness of breath. [provider]  Active Spouse/Significant Other           Med Note LEONARDO, KIMBERLY L   Thu Mar 04, 2024  4:08 PM) prn  azithromycin  (ZITHROMAX ) 250 MG tablet [id number]  TAKE 1 TABLET BY MOUTH 3 TIMES  WEEKLY  Patient not taking: Reported on 03/16/2024   Geronimo Amel, MD  Active Spouse/Significant Other           Med Note LEONARDO, [facility] L   Thu Mar 04, 2024  4:08 PM) On pause until finish other antibiotics  Calcium  Carb-Cholecalciferol  (CALCIUM  + D3 PO) [id number]  Take 1 tablet by mouth daily with breakfast. [provider]  Active Spouse/Significant Other  docusate sodium  (COLACE) 100 MG capsule [id number]  Take 2 capsules (200 mg total) by mouth 2 (two) times daily. Elgergawy, Brayton RAMAN, MD  Active Spouse/Significant Other  ELIQUIS  2.5 MG TABS tablet [id number]  TAKE 1 TABLET BY MOUTH TWICE  DAILY [name], MD  Active Spouse/Significant Other  Ensifentrine  (OHTUVAYRE ) 3 MG/2.5ML SUSP [id number]  INHALE THE CONTENTS OF 1 AMPULE VIA NEBULIZER IN THE MORNING AND IN THE KARNA Geronimo,  Amel, MD  Active Spouse/Significant Other  Ferrous Sulfate  (IRON ) 325 (65 Fe) MG TABS [id number]  Take 325 mg by mouth See admin instructions. Take 325 mg by mouth with food every other day  Patient taking differently: Take 325 mg by mouth See admin instructions. Take 325 mg by mouth with food every other day   [provider]  Active Spouse/Significant Other  fexofenadine (ALLEGRA) 180 MG tablet [id number]  Take 180 mg by mouth daily. [provider]  Active Spouse/Significant Other  fluticasone  (FLONASE ) 50 MCG/ACT nasal spray [id number]  USE 2 SPRAYS IN BOTH NOSTRILS  DAILY [name], MD  Active Spouse/Significant Other  folic acid  (FOLVITE ) 1 MG tablet [id number]  Take 1 tablet (1 mg total) by mouth daily. Mikhail, Maryann, DO  Active Spouse/Significant Other  furosemide  (LASIX ) 20 MG tablet [id number]  TAKE 1 TABLET BY MOUTH DAILY AS  NEEDED FOR FLUID OR EDEMA  Patient taking differently: Take 20 mg by mouth daily.   [name], MD  Active Spouse/Significant Other  guaiFENesin  (MUCINEX  PO) [id number]  Take 1,200 mg by mouth daily. [provider]  Active Spouse/Significant Other  ipratropium-albuterol  (DUONEB) 0.5-2.5 (3) MG/3ML SOLN [id number]  TAKE 3  MLS BY NEBULIZATION 3 (THREE) TIMES DAILY. Hope Almarie ORN, NP  Expired 03/11/24 2359 Spouse/Significant Other           Med Note LEONARDO, KIMBERLY L   Thu Mar 04, 2024  4:12 PM) prn  Multiple Vitamin (MULTIVITAMIN WITH MINERALS) TABS tablet [id number]  Take 1 tablet by mouth daily. Mikhail, Maryann, DO  Active Spouse/Significant Other  mupirocin  ointment (BACTROBAN ) 2 % [id number]  APPLY TOPICALLY TWICE DAILY FOR  SKIN INFECTIONS [name], MD  Active Spouse/Significant Other  OXYGEN [id number]  Inhale 5.5 L/min into the lungs continuous. [provider]  Active Spouse/Significant Other  pantoprazole  (PROTONIX ) 20 MG tablet [id number]  TAKE 1 TABLET BY MOUTH DAILY [name], MD   Active Spouse/Significant Other  predniSONE  (DELTASONE ) 10 MG tablet [id number]  Take 1 tablet (10 mg total) by mouth daily with breakfast. Geronimo Amel, MD  Active Spouse/Significant Other           Med Note LEONARDO, KIMBERLY L   Thu Mar 04, 2024  4:14 PM) Also taking the taper dose  rosuvastatin  (CRESTOR ) 40 MG tablet [id number]  Take 1 tablet (40 mg total) by mouth daily. [name], MD  Active Spouse/Significant Other  SPIRIVA  RESPIMAT 2.5 MCG/ACT AERS [id number]  USE 2 INHALATIONS BY MOUTH ONCE  DAILY Geronimo Amel, MD  Active Spouse/Significant Other  SYMBICORT  160-4.5 MCG/ACT inhaler [id number]  INHALE 2 INHALATIONS BY MOUTH  TWICE DAILY [name], MD  Active Spouse/Significant Other  SYSTANE PRESERVATIVE FREE 0.4-0.3 % SOLN [id number]  Place 1 drop into both eyes 2 (two) times daily. [provider]  Active Spouse/Significant Other  traMADol  (ULTRAM ) 50 MG tablet [id number]  Take 50 mg by mouth every 12 (twelve) hours as needed for severe pain. [provider]  Active Spouse/Significant Other           Med Note LEONARDO, KIMBERLY L   Thu Mar 04, 2024  4:15 PM) prn            Goals Addressed             This Visit's Progress    VBCI Transitions of Care (TOC) Care Plan       Problems:  Recent Hospitalization for treatment of cellulitis left arm Discharged on 03/08/2024 Chronic end stage COPD ( not reason for admission)  Caregiver stress 03/16/24- spoke with pt who reports  overall doing okay  finished antibiotics, reports left arm started swelling some yesterday  denies any other symptoms, no redness, no warmth, pt is not febrile, pt wants home health nurse to assess tomorrow 9/10 at her visit, does not want to make appointment with primary care provider  Goal:  Over the next 30 days, the patient will not experience hospital readmission  Interventions:  Transitions of Care: Doctor Visits  - discussed the importance of doctor  visits Post-op wound/incision care reviewed with patient/caregiver Reviewed Signs and symptoms of infection Reviewed with wife all medications and encouraged patient to finish course of Doxycycline  as prescribed. Reviewed importance of follow up with PCP  Encouraged wife to continue with management of COPD as prior to hospitalizations.  Oxygen at 5.5 liters, nebs, inhalers, rest , Cpap at night,  Reviewed caregiver stress as wife is full time care giver. Wife is considering getting some hired help. Reviewed COPD action plan Reviewed signs/ symptoms cellulitis, reportable signs /symptoms In basket message sent to Dr. Katrinka reporting pt has new swelling to left arm that started yesterday 9/8, home  health to assess tomorrow 9/10 and give provider an update   Patient Self Care Activities:  Attend all scheduled provider appointments Call provider office for new concerns or questions  Notify RN Care Manager of TOC call rescheduling needs Participate in Transition of Care Program/Attend TOC scheduled calls Take medications as prescribed   Monitor for signs of infection/ cellulitis Report any new symptoms to your doctor Continue working with home health Use oxygen as prescribed, be careful of tubing so you do not trip Follow COPD action plan  Plan:  Telephone follow up appointment with care management team member scheduled for:  03/23/24 @ 115 pm         Recommendation:   PCP Follow-up Call your doctor for any worsening symptoms  Follow Up Plan:   Telephone follow-up 03/23/24 @ 115 pm  Mliss Creed [facility], BSN RN Care Manager/ Transition of Care [place]/ [facility] [phone]

## 2024-03-18 ENCOUNTER — Telehealth: Payer: Self-pay | Admitting: *Deleted

## 2024-03-18 ENCOUNTER — Ambulatory Visit: Payer: Self-pay

## 2024-03-18 NOTE — Patient Outreach (Signed)
 03/18/2024  Patient ID: Francisco [name], male   DOB: [birth year], 78 y.o.   MRN: [id number]  Telephone call with [facility] RN Ellouise Proud, she is at patient's home, update given to RN CM that patient's left arm does have some swelling and an area that looks maybe like a hematoma,  Home health RN is going to call primary care provider and report her findings.  Pt is afebrile, no redness.  Mliss Creed [facility], BSN RN Care Manager/ Transition of Care [place]/ [facility] [phone]

## 2024-03-18 NOTE — Telephone Encounter (Signed)
 In person would be most ideal but if they would really like to do a virtual we can do 420 on Friday

## 2024-03-18 NOTE — Telephone Encounter (Signed)
 Please see triage message and advise.

## 2024-03-18 NOTE — Telephone Encounter (Signed)
 FYI Only or Action Required?: Action required by provider: update on patient condition.  Patient was last seen in primary care on 03/11/2024 by [name], MD.  Called Nurse Triage reporting Arm Swelling.  Symptoms began a week ago.  Interventions attempted: Nothing.  Symptoms are: gradually worsening.  Triage Disposition: See Physician Within 24 Hours  Patient/caregiver understands and will follow disposition?: Unsure        Copied from CRM #[id number]. Topic: Clinical - Red Word Triage >> Mar 18, 2024  1:43 PM Berneda FALCON wrote: Red Word that prompted transfer to Nurse Triage: Dr Katrinka left message with the outreach program regarding pt's left arm. Wanted nurse to look at it and assess. Patient says the hand is swollen but nurse is unable to press in to see if it is pitting. Left side of the arm where hematoma is is gray and hard.   Caller is Ellouise who is a Engineer, civil (consulting) with [employer]. Reason for Disposition  [1] MODERATE pain (e.g., interferes with normal activities) AND [2] high-risk adult (e.g., age > 60 years, osteoporosis, chronic steroid use)  Additional Information  Commented on: Answer Assessment    Triager offered to schedule with alternate provider, pt declines and only wishes to see PCP virtually only d/t mobility limitations. Triager will forward encounter for Dr Katrinka 's office to review and see if pt can be accommodated. Patient verbalized understanding and is expecting call back from office for next steps, if any.  Answer Assessment - Initial Assessment Questions 1. ONSET: When did the swelling start? (e.g., minutes, hours, days)     Last week 2. LOCATION: What part of the arm is swollen?  Are both arms swollen or just one arm?     L arm HH clinician reports venous blood gas in LFA last week - was unsuccessful and left a hematoma-- initially red and swollen, now gray and hard to touch 3. SEVERITY: How bad is the swelling? (e.g., localized; mild, moderate,  severe)     Localized to hand and wrist 4. REDNESS: Is there redness or signs of infection?     Gray and swollen -- HH clinician reports non-blanchable area 5. PAIN: Is the swelling painful to touch? If Yes, ask: How painful is it?   (Scale 1-10; mild, moderate or severe)     Tender to touch 6. FEVER: Do you have a fever? If Yes, ask: What is it, how was it measured, and when did it start?      denies 7. CAUSE: What do you think is causing the arm swelling?     [facility] clinician reports thinks r/t unsuccessful VBG 8. MEDICAL HISTORY: Do you have a history of heart failure, kidney disease, liver failure, or cancer?     CHF, COPD 9. RECURRENT SYMPTOM: Have you had arm swelling before? If Yes, ask: When was the last time? What happened that time?     Endorses index finger of L hand is bent 10. OTHER SYMPTOMS: Do you have any other symptoms? (e.g., chest pain, difficulty breathing)       denies 11. PREGNANCY: Is there any chance you are pregnant? When was your last menstrual period?       N/a  Protocols used: Arm Swelling and Edema-A-AH, Arm Injury-A-AH

## 2024-03-19 ENCOUNTER — Telehealth: Admitting: Family Medicine

## 2024-03-19 DIAGNOSIS — S5010XA Contusion of unspecified forearm, initial encounter: Secondary | ICD-10-CM | POA: Diagnosis not present

## 2024-03-19 DIAGNOSIS — M24542 Contracture, left hand: Secondary | ICD-10-CM

## 2024-03-19 NOTE — Progress Notes (Signed)
 Phone [phone] Virtual visit via Video note   Subjective:  Chief complaint: Chief Complaint  Patient presents with   Arm Swelling    Our team/I connected with Francisco [name] at  4:20 PM EDT by a video enabled telemedicine application (caregility through epic) and verified that I am speaking with the correct person using two identifiers. Our team/I discussed the limitations of evaluation and management by telemedicine and the availability of in person appointments.No physical exam was performed (except for noted visual exam or audio findings with Telehealth visits).   Location patient: [place] Location provider: [facility], office Persons participating in the virtual visit:  patient  Past Medical History-  Patient Active Problem List   Diagnosis Date Noted   Lumbar compression fracture (HCC) 09/11/2022    Priority: High   Chronic systolic congestive heart failure (HCC) 12/10/2021    Priority: High   Malignant neoplasm of lower lobe of right lung (HCC)     Priority: High   Peripheral artery disease (HCC) 12/11/2018    Priority: High   End stage COPD (HCC) 11/27/2018    Priority: High   DNR (do not resuscitate) 04/21/2018    Priority: High   Chronic pulmonary embolism (HCC) 04/07/2018    Priority: High   Osteoporosis 11/19/2017    Priority: High   Thoracic compression fracture (HCC) 11/02/2017    Priority: High   Former smoker 08/23/2008    Priority: High   Vitamin D  deficiency 03/03/2023    Priority: Medium    Syncope and collapse 02/20/2023    Priority: Medium    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    Priority: Medium    History of upper GI bleeding 04/30/2020    Priority: Medium    Essential tremor 03/31/2018    Priority: Medium    BPPV (benign paroxysmal positional vertigo), right 02/10/2018    Priority: Medium    Essential hypertension 11/02/2017    Priority: Medium    BPH associated with nocturia 06/25/2017    Priority: Medium     Hyperlipidemia 07/22/2014    Priority: Medium    Pre-operative respiratory examination 12/10/2018    Priority: Low   Venous stasis dermatitis of both lower extremities 11/02/2017    Priority: Low   Chronic hypoxic respiratory failure (HCC) 09/23/2017    Priority: Low   History of skin cancer 05/17/2014    Priority: Low   Chronic rhinitis 10/28/2012    Priority: Low   Pulmonary nodule 09/23/2011    Priority: Low   History of colonic polyps 08/23/2008    Priority: Low   Cellulitis of left hand 03/04/2024   History of lung cancer 03/04/2024   Hx of sinus tachycardia 03/04/2024   History of pulmonary embolism 03/04/2024   Acute on chronic respiratory failure with hypoxia and hypercapnia (HCC) 07/11/2023   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   Skin tear of elbow without complication, left, initial encounter 07/10/2023   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     albuterol  (VENTOLIN  HFA)  108 (90 Base) MCG/ACT inhaler Inhale 2 puffs into the lungs every 6 (six) hours as needed for wheezing or shortness of breath.     Calcium  Carb-Cholecalciferol  (CALCIUM  + D3 PO) Take 1 tablet by mouth daily with breakfast.     docusate sodium  (COLACE) 100 MG capsule Take 2 capsules (200 mg total) by mouth 2 (two) times daily. 10 capsule 0   ELIQUIS  2.5 MG TABS tablet TAKE 1 TABLET BY MOUTH TWICE  DAILY 180 tablet 3   Ensifentrine  (OHTUVAYRE ) 3 MG/2.5ML SUSP INHALE THE CONTENTS OF 1 AMPULE  VIA NEBULIZER IN THE MORNING AND IN THE EVENING 450 mL 1   Ferrous Sulfate  (IRON ) 325 (65 Fe) MG TABS Take 325 mg by mouth See admin instructions. Take 325 mg by mouth with food every other day (Patient taking differently: Take 325 mg by mouth See admin instructions. Take 325 mg by mouth with food every other day)     fexofenadine (ALLEGRA) 180 MG tablet Take 180 mg by mouth daily.     fluticasone  (FLONASE ) 50 MCG/ACT nasal spray USE 2 SPRAYS IN BOTH NOSTRILS  DAILY 48 g 3   folic acid  (FOLVITE ) 1 MG tablet Take 1 tablet (1 mg total) by mouth daily.     furosemide  (LASIX ) 20 MG tablet TAKE 1 TABLET BY MOUTH DAILY AS  NEEDED FOR FLUID OR EDEMA (Patient taking differently: Take 20 mg by mouth daily.) 90 tablet 3   guaiFENesin  (MUCINEX  PO) Take 1,200 mg by mouth daily.     ipratropium-albuterol  (DUONEB) 0.5-2.5 (3) MG/3ML SOLN TAKE 3 MLS BY NEBULIZATION 3 (THREE) TIMES DAILY. 810 mL 0   Multiple Vitamin (MULTIVITAMIN WITH MINERALS) TABS tablet Take 1 tablet by mouth daily.     mupirocin  ointment (BACTROBAN ) 2 % APPLY TOPICALLY TWICE DAILY FOR  SKIN INFECTIONS 60 g 3   OXYGEN Inhale 5.5 L/min into the lungs continuous.     pantoprazole  (PROTONIX ) 20 MG tablet TAKE 1 TABLET BY MOUTH DAILY 90 tablet 3   predniSONE  (DELTASONE ) 10 MG tablet Take 1 tablet (10 mg total) by mouth daily with breakfast. 90 tablet 3   rosuvastatin  (CRESTOR ) 40 MG tablet Take 1 tablet (40 mg total) by mouth daily. 90 tablet 3   SPIRIVA  RESPIMAT 2.5 MCG/ACT AERS USE 2 INHALATIONS BY MOUTH ONCE  DAILY 12 g 3   SYMBICORT  160-4.5 MCG/ACT inhaler INHALE 2 INHALATIONS BY MOUTH  TWICE DAILY 30.6 g 3   SYSTANE PRESERVATIVE FREE 0.4-0.3 % SOLN Place 1 drop into both eyes 2 (two) times daily.     traMADol  (ULTRAM ) 50 MG tablet Take 50 mg by mouth every 12 (twelve) hours as needed for severe pain.     azithromycin  (ZITHROMAX ) 250 MG tablet TAKE 1 TABLET BY MOUTH 3 TIMES  WEEKLY (Patient not taking: Reported on 03/19/2024) 39 tablet 3   No  current facility-administered medications for this visit.     Objective:  There were no vitals taken for this visit. self reported vitals Gen: NAD, resting comfortably He is unable to get arm position today for me to view hematoma or rupture     Assessment and Plan   # Left arm hematoma, left finger contracture S:day before yesterday hand with prior cellulitis was still swelling but improved since that time  On the other hand the hematoma on the left arm - has gotten darker  and is more painful now.  He still has a contracture of the left hand.  He is now ready to consider  referral to orthopedics A/P: Left finger contraction seems to potentially be related to the left arm hematoma given time course but I am not completely aware of potential mechanism here-urgent referral to hopefully Dr. Alyse with [facility] in [place].  Traveling for patient is difficult and this is a closer office for him so hoping to get him in there-we also discussed seeing the 2 sports medicine doctors available there as well as other options  Recommended follow up:  Future Appointments  Date Time Provider Department Center  03/23/2024  1:15 PM Aura Mliss LABOR, RN [facility] None  04/12/2024 11:30 AM Geronimo Amel, MD [facility] [address]  06/08/2024  1:30 PM [facility] 1 [facility] [place]  06/21/2024  1:30 PM Lanell Donald Stagger, PA-C [facility] None    Lab/Order associations:   ICD-10-CM   1. Contracture of finger joint, left  M24.542 Ambulatory referral to Orthopedic Surgery    2. Hematoma of forearm  S50.10XA Ambulatory referral to Orthopedic Surgery      Return precautions advised.  Garnette Lukes, MD

## 2024-03-23 ENCOUNTER — Other Ambulatory Visit: Payer: Self-pay | Admitting: *Deleted

## 2024-03-23 NOTE — Patient Instructions (Signed)
 Visit Information  Thank you for taking time to visit with me today. Please don't hesitate to contact me if I can be of assistance to you before our next scheduled telephone appointment.  Our next appointment is by telephone on 03/30/24 @ 945 am  Following is a copy of your care plan:   Goals Addressed             This Visit's Progress    VBCI Transitions of Care (TOC) Care Plan       Problems:  Recent Hospitalization for treatment of cellulitis left arm Discharged on 03/08/2024 Chronic end stage COPD ( not reason for admission)  Caregiver stress 03/16/24- spoke with pt who reports  overall doing okay  finished antibiotics, reports left arm started swelling some yesterday  denies any other symptoms, no redness, no warmth, pt is not febrile, pt wants home health nurse to assess tomorrow 9/10 at her visit, does not want to make appointment with primary care provider 03/23/24- pt had telemedicine visit with primary care provider on 03/19/24 regarding left arm, swelling  is much better per pt, referral has been placed to hand specialist at [facility], pt cannot lift his left index finger, has hematoma to the left hand/ arm area, home health continues working with pt and provider assessment/ oversight  Goal:  Over the next 30 days, the patient will not experience hospital readmission  Interventions:  Transitions of Care: Doctor Visits  - discussed the importance of doctor visits Post-op wound/incision care reviewed with patient/caregiver Reviewed Signs and symptoms of infection Reviewed medications and importance of taking as prescribed Reviewed caregiver stress as wife is full time care giver. Wife is considering getting some hired help. Reinforced COPD action plan Reinforced signs/ symptoms cellulitis/ infection, reportable signs /symptoms Reviewed/ verified referral placed to [facility] for follow up on left arm/ hand, pt has contact #  Patient Self Care Activities:  Attend all  scheduled provider appointments Call provider office for new concerns or questions  Notify RN Care Manager of TOC call rescheduling needs Participate in Transition of Care Program/Attend TOC scheduled calls Take medications as prescribed   Monitor for signs of infection/ cellulitis Report any new symptoms to your doctor Continue working with home health Use oxygen as prescribed, be careful of tubing so you do not trip Follow COPD action plan If you do not hear from [facility] by 03/26/24- please contact them, call RN CM if you need any assistance  Plan:  Telephone follow up appointment with care management team member scheduled for:  03/30/24 @ 945 am        Patient verbalizes understanding of instructions and care plan provided today and agrees to view in MyChart. Active MyChart status and patient understanding of how to access instructions and care plan via MyChart confirmed with patient.     Telephone follow up appointment with care management team member scheduled for: 03/30/24 @ 945 am  Please call the care guide team at [phone] if you need to cancel or reschedule your appointment.   Please call the Suicide and Crisis Lifeline: 988 call the [place]  National Suicide Prevention Lifeline: 769 391 2423 or TTY: (937)832-2480 TTY [phone]) to talk to a trained counselor call 1-800-273-TALK (toll free, 24 hour hotline) go to [facility] [address], [place] [phone]) call 911 if you are experiencing a Mental Health or Behavioral Health Crisis or need someone to talk to.  Mliss Creed [facility], BSN RN Care Manager/ Transition of  Care [place]/ [facility] [phone]

## 2024-03-23 NOTE — Patient Outreach (Signed)
 Transition of Care week 4  Visit Note  03/23/2024  Name: Francisco [name] MRN: [id number]          DOB: 1946/01/11  Situation: Patient enrolled in [facility] 30-day program. Visit completed with patient by telephone.   Background:     Past Medical History:  Diagnosis Date   Acute GI bleeding 02/18/2020   Melena with hemoglobin down to 10 February 2020.  EGD with intervention-follow-up with Dr. Wilhelmenia     Allergy     Blood transfusion without reported diagnosis    CHF (congestive heart failure) (HCC) 2024   [facility] rehab told us    Chronic back pain    mid and lower (04/07/2018)   Chronic rhinitis    -Sinus Ct 08/01/2009 >> Bilateral maxillary sinusitis with some mucosal thickeningin the sphenoid and frontal sinuses as well with air fluid levels present -chronic rhinitis flyer Aug 04, 2009   Clotting disorder [facility]) 2022@2024    Internal bleed   Compressed spine fracture (HCC)    COPD (chronic obstructive pulmonary disease) (HCC)    PFT's rec Jul 17, 2009   Dyspnea    Emphysema lung (HCC)    Emphysema of lung (HCC)    GERD (gastroesophageal reflux disease)    Hyperglycemia 06/12/2016   Hypertension 2/24   Questionable   Iron  deficiency anemia due to chronic blood loss 08/23/2020   Left ankle swelling 07/22/2014   HTN- controlled when fluid status contolled. on triamterene -hctz 37.5-25mg  sparingly  Unclear etiology.   LLE U/S 2016:     Leukocytosis 12/19/2016   Melena 08/11/2022   On home oxygen therapy    3L; 24/7 (04/07/2018)   Onychomycosis    Dr. Naomi   Orthostatic hypotension    since 10/2017 (04/07/2018)   Osteoporosis    Oxygen deficiency    PAD (peripheral artery disease) (HCC)    Pneumonia    twice in 1 year (04/07/2018)   Pulmonary embolism (HCC) 04/07/2018   Skin cancer    lips, face, ears, arms (04/07/2018)   Small cell lung cancer, right (HCC) 2023   Dr. Dewey.  rad   Vertigo    since ~ 02/2018 (04/07/2018)    Assessment: Patient Reported  Symptoms: Cognitive Cognitive Status: No symptoms reported, Alert and oriented to person, place, and time, Able to follow simple commands, Normal speech and language skills      Neurological Neurological Review of Symptoms: No symptoms reported    HEENT HEENT Symptoms Reported: No symptoms reported      Cardiovascular Cardiovascular Symptoms Reported: Swelling in legs or feet (chronic) Does patient have uncontrolled Hypertension?: No Cardiovascular Management Strategies: Medication therapy, Adequate rest Cardiovascular Self-Management Outcome: 4 (good)  Respiratory Respiratory Symptoms Reported: Shortness of breath Other Respiratory Symptoms: end stage COPD, on oxygen at 5.5 liters continuously,  pt has chronic dyspnea, mostly with exertion Additional Respiratory Details: Reinforced COPD action plan Respiratory Management Strategies: NPPV, Adequate rest, Routine screening Respiratory Self-Management Outcome: 3 (uncertain)  Endocrine Endocrine Symptoms Reported: No symptoms reported Is patient diabetic?: No    Gastrointestinal Gastrointestinal Symptoms Reported: No symptoms reported      Genitourinary Genitourinary Symptoms Reported: No symptoms reported    Integumentary Integumentary Symptoms Reported: Other Other Integumentary Symptoms: pt reports swelling in left arm has subsided, states index finger on left hand cannot lift, pt has hematoma (states this happened in the hospital), referral has been placed to hand surgeon at [facility], takes 3-5 business days for referral to process, pt states he has  been there before, has contact #, will call if has not heard from them by 03/26/24. Additional Integumentary Details: pt had telemedicine visit with primary care provider on 03/19/24 Skin Management Strategies: Medication therapy Skin Self-Management Outcome: 3 (uncertain) Skin Comment: Reviewed signs/ symptoms of infection  Musculoskeletal Musculoskelatal Symptoms Reviewed:  Difficulty walking Musculoskeletal Management Strategies: Medical device, Routine screening Musculoskeletal Self-Management Outcome: 4 (good) Musculoskeletal Comment: reviewed safety precautions      Psychosocial Psychosocial Symptoms Reported: No symptoms reported         There were no vitals filed for this visit.  Medications Reviewed Today     Reviewed by Aura Mliss LABOR, RN (Registered Nurse) on 03/23/24 at 1332  Med List Status: <None>   Medication Order Taking? Sig Documenting Provider Last Dose Status Informant  acetaminophen  (TYLENOL ) 500 MG tablet [id number]  Take 500 mg by mouth every 4 (four) hours as needed for moderate pain, fever, headache or mild pain. [provider]  Active Spouse/Significant Other  albuterol  (VENTOLIN  HFA) 108 (90 Base) MCG/ACT inhaler [id number]  Inhale 2 puffs into the lungs every 6 (six) hours as needed for wheezing or shortness of breath. [provider]  Active Spouse/Significant Other           Med Note LEONARDO, KIMBERLY L   Thu Mar 04, 2024  4:08 PM) prn  azithromycin  (ZITHROMAX ) 250 MG tablet [id number]  TAKE 1 TABLET BY MOUTH 3 TIMES  WEEKLY  Patient not taking: Reported on 03/19/2024   Geronimo Amel, MD  Active Spouse/Significant Other           Med Note LEONARDO, [facility] L   Thu Mar 04, 2024  4:08 PM) On pause until finish other antibiotics  Calcium  Carb-Cholecalciferol  (CALCIUM  + D3 PO) [id number]  Take 1 tablet by mouth daily with breakfast. [provider]  Active Spouse/Significant Other  docusate sodium  (COLACE) 100 MG capsule [id number]  Take 2 capsules (200 mg total) by mouth 2 (two) times daily. Elgergawy, Brayton RAMAN, MD  Active Spouse/Significant Other  ELIQUIS  2.5 MG TABS tablet [id number]  TAKE 1 TABLET BY MOUTH TWICE  DAILY [name], MD  Active Spouse/Significant Other  Ensifentrine  (OHTUVAYRE ) 3 MG/2.5ML SUSP [id number]  INHALE THE CONTENTS OF 1 AMPULE VIA NEBULIZER IN THE MORNING AND IN THE  KARNA Geronimo, Amel, MD  Active Spouse/Significant Other  Ferrous Sulfate  (IRON ) 325 (65 Fe) MG TABS [id number]  Take 325 mg by mouth See admin instructions. Take 325 mg by mouth with food every other day  Patient taking differently: Take 325 mg by mouth See admin instructions. Take 325 mg by mouth with food every other day   [provider]  Active Spouse/Significant Other  fexofenadine (ALLEGRA) 180 MG tablet [id number]  Take 180 mg by mouth daily. [provider]  Active Spouse/Significant Other  fluticasone  (FLONASE ) 50 MCG/ACT nasal spray [id number]  USE 2 SPRAYS IN BOTH NOSTRILS  DAILY [name], MD  Active Spouse/Significant Other  folic acid  (FOLVITE ) 1 MG tablet [id number]  Take 1 tablet (1 mg total) by mouth daily. Mikhail, Maryann, DO  Active Spouse/Significant Other  furosemide  (LASIX ) 20 MG tablet [id number]  TAKE 1 TABLET BY MOUTH DAILY AS  NEEDED FOR FLUID OR EDEMA  Patient taking differently: Take 20 mg by mouth daily.   [name], MD  Active Spouse/Significant Other  guaiFENesin  (MUCINEX  PO) [id number]  Take 1,200 mg by mouth daily. [provider]  Active Spouse/Significant Other  ipratropium-albuterol  (DUONEB) 0.5-2.5 (3) MG/3ML SOLN  [id number]  TAKE 3 MLS BY NEBULIZATION 3 (THREE) TIMES DAILY. Hope Almarie ORN, NP  Expired 03/19/24 2359 Spouse/Significant Other           Med Note LEONARDO, KIMBERLY L   Thu Mar 04, 2024  4:12 PM) prn  Multiple Vitamin (MULTIVITAMIN WITH MINERALS) TABS tablet [id number]  Take 1 tablet by mouth daily. Mikhail, Maryann, DO  Active Spouse/Significant Other  mupirocin  ointment (BACTROBAN ) 2 % [id number]  APPLY TOPICALLY TWICE DAILY FOR  SKIN INFECTIONS [name], MD  Active Spouse/Significant Other  OXYGEN [id number]  Inhale 5.5 L/min into the lungs continuous. [provider]  Active Spouse/Significant Other  pantoprazole  (PROTONIX ) 20 MG tablet [id number]  TAKE 1 TABLET BY MOUTH DAILY [name], MD  Active Spouse/Significant Other  predniSONE  (DELTASONE ) 10 MG tablet [id number]  Take 1 tablet (10 mg total) by mouth daily with breakfast. Geronimo Amel, MD  Active Spouse/Significant Other           Med Note LEONARDO, KIMBERLY L   Thu Mar 04, 2024  4:14 PM) Also taking the taper dose  rosuvastatin  (CRESTOR ) 40 MG tablet [id number]  Take 1 tablet (40 mg total) by mouth daily. [name], MD  Active Spouse/Significant Other  SPIRIVA  RESPIMAT 2.5 MCG/ACT AERS [id number]  USE 2 INHALATIONS BY MOUTH ONCE  DAILY Geronimo Amel, MD  Active Spouse/Significant Other  SYMBICORT  160-4.5 MCG/ACT inhaler [id number]  INHALE 2 INHALATIONS BY MOUTH  TWICE DAILY [name], MD  Active Spouse/Significant Other  SYSTANE PRESERVATIVE FREE 0.4-0.3 % SOLN [id number]  Place 1 drop into both eyes 2 (two) times daily. [provider]  Active Spouse/Significant Other  traMADol  (ULTRAM ) 50 MG tablet [id number]  Take 50 mg by mouth every 12 (twelve) hours as needed for severe pain. [provider]  Active Spouse/Significant Other           Med Note LEONARDO, KIMBERLY L   Thu Mar 04, 2024  4:15 PM) prn            Goals Addressed             This Visit's Progress    [facility] Transitions of Care (TOC) Care Plan       Problems:  Recent Hospitalization for treatment of cellulitis left arm Discharged on 03/08/2024 Chronic end stage COPD ( not reason for admission)  Caregiver stress 03/16/24- spoke with pt who reports  overall doing okay  finished antibiotics, reports left arm started swelling some yesterday  denies any other symptoms, no redness, no warmth, pt is not febrile, pt wants home health nurse to assess tomorrow 9/10 at her visit, does not want to make appointment with primary care provider 03/23/24- pt had telemedicine visit with primary care provider on 03/19/24 regarding left arm, swelling  is much better per pt, referral has been placed to hand specialist at  [facility], pt cannot lift his left index finger, has hematoma to the left hand/ arm area, home health continues working with pt and provider assessment/ oversight  Goal:  Over the next 30 days, the patient will not experience hospital readmission  Interventions:  Transitions of Care: Doctor Visits  - discussed the importance of doctor visits Post-op wound/incision care reviewed with patient/caregiver Reviewed Signs and symptoms of infection Reviewed medications and importance of taking as prescribed Reviewed caregiver stress as wife is full time care giver. Wife is considering getting some hired help. Reinforced COPD action plan Reinforced signs/ symptoms cellulitis/ infection, reportable signs /  symptoms Reviewed/ verified referral placed to [facility] for follow up on left arm/ hand, pt has contact #  Patient Self Care Activities:  Attend all scheduled provider appointments Call provider office for new concerns or questions  Notify RN Care Manager of TOC call rescheduling needs Participate in Transition of Care Program/Attend TOC scheduled calls Take medications as prescribed   Monitor for signs of infection/ cellulitis Report any new symptoms to your doctor Continue working with home health Use oxygen as prescribed, be careful of tubing so you do not trip Follow COPD action plan If you do not hear from [facility] by 03/26/24- please contact them, call RN CM if you need any assistance  Plan:  Telephone follow up appointment with care management team member scheduled for:  03/30/24 @ 945 am         Goals Addressed             This Visit's Progress    [facility] Transitions of Care (TOC) Care Plan       Problems:  Recent Hospitalization for treatment of cellulitis left arm Discharged on 03/08/2024 Chronic end stage COPD ( not reason for admission)  Caregiver stress 03/16/24- spoke with pt who reports  overall doing okay  finished antibiotics, reports left arm started  swelling some yesterday  denies any other symptoms, no redness, no warmth, pt is not febrile, pt wants home health nurse to assess tomorrow 9/10 at her visit, does not want to make appointment with primary care provider 03/23/24- pt had telemedicine visit with primary care provider on 03/19/24 regarding left arm, swelling  is much better per pt, referral has been placed to hand specialist at [facility], pt cannot lift his left index finger, has hematoma to the left hand/ arm area, home health continues working with pt and provider assessment/ oversight  Goal:  Over the next 30 days, the patient will not experience hospital readmission  Interventions:  Transitions of Care: Doctor Visits  - discussed the importance of doctor visits Post-op wound/incision care reviewed with patient/caregiver Reviewed Signs and symptoms of infection Reviewed medications and importance of taking as prescribed Reviewed caregiver stress as wife is full time care giver. Wife is considering getting some hired help. Reinforced COPD action plan Reinforced signs/ symptoms cellulitis/ infection, reportable signs /symptoms Reviewed/ verified referral placed to [facility] for follow up on left arm/ hand, pt has contact #  Patient Self Care Activities:  Attend all scheduled provider appointments Call provider office for new concerns or questions  Notify RN Care Manager of TOC call rescheduling needs Participate in Transition of Care Program/Attend TOC scheduled calls Take medications as prescribed   Monitor for signs of infection/ cellulitis Report any new symptoms to your doctor Continue working with home health Use oxygen as prescribed, be careful of tubing so you do not trip Follow COPD action plan If you do not hear from [facility] by 03/26/24- please contact them, call RN CM if you need any assistance  Plan:  Telephone follow up appointment with care management team member scheduled for:  03/30/24 @ 945 am          Recommendation:   PCP Follow-up Referral to: [facility] in process  Follow Up Plan:   Telephone follow-up 03/30/24 @ 945 am  Mliss Creed [facility], BSN RN Care Manager/ Transition of Care [place]/ [facility] [phone]

## 2024-03-30 ENCOUNTER — Other Ambulatory Visit: Payer: Self-pay | Admitting: *Deleted

## 2024-03-30 ENCOUNTER — Telehealth: Payer: Self-pay | Admitting: Family Medicine

## 2024-03-30 NOTE — Patient Outreach (Signed)
 Transition of Care week 5  Visit Note  03/30/2024  Name: Francisco [name] MRN: [id number]          DOB: [birth year]  Situation: Patient enrolled in [facility] 30-day program. Visit completed with patient by telephone.   Background:    Past Medical History:  Diagnosis Date   Acute GI bleeding 02/18/2020   Melena with hemoglobin down to 10 February 2020.  EGD with intervention-follow-up with Dr. Wilhelmenia     Allergy     Blood transfusion without reported diagnosis    CHF (congestive heart failure) (HCC) 2024   [facility] rehab told us    Chronic back pain    mid and lower (04/07/2018)   Chronic rhinitis    -Sinus Ct 08/01/2009 >> Bilateral maxillary sinusitis with some mucosal thickeningin the sphenoid and frontal sinuses as well with air fluid levels present -chronic rhinitis flyer Aug 04, 2009   Clotting disorder 2022@2024    Internal bleed   Compressed spine fracture (HCC)    COPD (chronic obstructive pulmonary disease) (HCC)    PFT's rec Jul 17, 2009   Dyspnea    Emphysema lung (HCC)    Emphysema of lung (HCC)    GERD (gastroesophageal reflux disease)    Hyperglycemia 06/12/2016   Hypertension 2/24   Questionable   Iron  deficiency anemia due to chronic blood loss 08/23/2020   Left ankle swelling 07/22/2014   HTN- controlled when fluid status contolled. on triamterene -hctz 37.5-25mg  sparingly  Unclear etiology.   LLE U/S 2016:     Leukocytosis 12/19/2016   Melena 08/11/2022   On home oxygen therapy    3L; 24/7 (04/07/2018)   Onychomycosis    Dr. Naomi   Orthostatic hypotension    since 10/2017 (04/07/2018)   Osteoporosis    Oxygen deficiency    PAD (peripheral artery disease)    Pneumonia    twice in 1 year (04/07/2018)   Pulmonary embolism (HCC) 04/07/2018   Skin cancer    lips, face, ears, arms (04/07/2018)   Small cell lung cancer, right (HCC) 2023   Dr. Dewey.  rad   Vertigo    since ~ 02/2018 (04/07/2018)    Assessment: Patient Reported  Symptoms: Cognitive Cognitive Status: No symptoms reported, Alert and oriented to person, place, and time, Normal speech and language skills, Able to follow simple commands      Neurological Neurological Review of Symptoms: No symptoms reported    HEENT HEENT Symptoms Reported: No symptoms reported      Cardiovascular Cardiovascular Symptoms Reported: Swelling in legs or feet (chronic) Does patient have uncontrolled Hypertension?: No Cardiovascular Management Strategies: Medication therapy, Adequate rest Cardiovascular Self-Management Outcome: 4 (good)  Respiratory Respiratory Symptoms Reported: Shortness of breath Other Respiratory Symptoms: end stage COPD, on oxygen at 5.5 liters continuously, pt has chronic dyspnea, worse with exertion Additional Respiratory Details: Reveiwed COPD action plan Respiratory Management Strategies: Adequate rest, Oxygen therapy, Routine screening Respiratory Self-Management Outcome: 3 (uncertain)  Endocrine Endocrine Symptoms Reported: No symptoms reported Is patient diabetic?: No Endocrine Self-Management Outcome: 4 (good)  Gastrointestinal Gastrointestinal Symptoms Reported: No symptoms reported Other Gastrointestinal Symptoms: uses stool softeners regular, usually has BM QOD Gastrointestinal Management Strategies: Medication therapy Gastrointestinal Self-Management Outcome: 4 (good)    Genitourinary Genitourinary Symptoms Reported: No symptoms reported    Integumentary Integumentary Symptoms Reported: Other Other Integumentary Symptoms: pt states swelling in left arm has subsided, hematoma is looking much better, pt saw hand surgeon at [facility], states  I have a broken tendon, only  way to fix is surgery  pt states he is declining surgery, states still cannot life index finger on left hand Skin Management Strategies: Routine screening Skin Self-Management Outcome: 3 (uncertain) Skin Comment: Reinforced signs/ symptoms of infection   Musculoskeletal Musculoskelatal Symptoms Reviewed: Difficulty walking Other Musculoskeletal Symptoms: unable to lift finger left index finger Musculoskeletal Management Strategies: Routine screening Musculoskeletal Self-Management Outcome: 4 (good) Musculoskeletal Comment: reinforced safety precautions      Psychosocial Psychosocial Symptoms Reported: No symptoms reported         There were no vitals filed for this visit.  Medications Reviewed Today     Reviewed by Aura Mliss LABOR, RN (Registered Nurse) on 03/30/24 at 0957  Med List Status: <None>   Medication Order Taking? Sig Documenting Provider Last Dose Status Informant  acetaminophen  (TYLENOL ) 500 MG tablet [id number]  Take 500 mg by mouth every 4 (four) hours as needed for moderate pain, fever, headache or mild pain. [provider]  Active Spouse/Significant Other  albuterol  (VENTOLIN  HFA) 108 (90 Base) MCG/ACT inhaler [id number]  Inhale 2 puffs into the lungs every 6 (six) hours as needed for wheezing or shortness of breath. [provider]  Active Spouse/Significant Other           Med Note LEONARDO, KIMBERLY L   Thu Mar 04, 2024  4:08 PM) prn  azithromycin  (ZITHROMAX ) 250 MG tablet [id number]  TAKE 1 TABLET BY MOUTH 3 TIMES  WEEKLY  Patient not taking: Reported on 03/19/2024   Geronimo Amel, MD  Active Spouse/Significant Other           Med Note LEONARDO, [facility] L   Thu Mar 04, 2024  4:08 PM) On pause until finish other antibiotics  Calcium  Carb-Cholecalciferol  (CALCIUM  + D3 PO) [id number]  Take 1 tablet by mouth daily with breakfast. [provider]  Active Spouse/Significant Other  docusate sodium  (COLACE) 100 MG capsule [id number]  Take 2 capsules (200 mg total) by mouth 2 (two) times daily. Elgergawy, Brayton RAMAN, MD  Active Spouse/Significant Other  ELIQUIS  2.5 MG TABS tablet [id number]  TAKE 1 TABLET BY MOUTH TWICE  DAILY [name], MD  Active Spouse/Significant Other  Ensifentrine   (OHTUVAYRE ) 3 MG/2.5ML SUSP [id number]  INHALE THE CONTENTS OF 1 AMPULE VIA NEBULIZER IN THE MORNING AND IN THE KARNA Geronimo, Amel, MD  Active Spouse/Significant Other  Ferrous Sulfate  (IRON ) 325 (65 Fe) MG TABS [id number]  Take 325 mg by mouth See admin instructions. Take 325 mg by mouth with food every other day  Patient taking differently: Take 325 mg by mouth See admin instructions. Take 325 mg by mouth with food every other day   [provider]  Active Spouse/Significant Other  fexofenadine (ALLEGRA) 180 MG tablet [id number]  Take 180 mg by mouth daily. [provider]  Active Spouse/Significant Other  fluticasone  (FLONASE ) 50 MCG/ACT nasal spray [id number]  USE 2 SPRAYS IN BOTH NOSTRILS  DAILY [name], MD  Active Spouse/Significant Other  folic acid  (FOLVITE ) 1 MG tablet [id number]  Take 1 tablet (1 mg total) by mouth daily. Mikhail, Maryann, DO  Active Spouse/Significant Other  furosemide  (LASIX ) 20 MG tablet [id number]  TAKE 1 TABLET BY MOUTH DAILY AS  NEEDED FOR FLUID OR EDEMA  Patient taking differently: Take 20 mg by mouth daily.   [name], MD  Active Spouse/Significant Other  guaiFENesin  (MUCINEX  PO) [id number]  Take 1,200 mg by mouth daily. [provider]  Active Spouse/Significant Other  ipratropium-albuterol  (DUONEB) 0.5-2.5 (3) MG/3ML SOLN  [id number]  TAKE 3 MLS BY NEBULIZATION 3 (THREE) TIMES DAILY. Hope Almarie ORN, NP  Expired 03/19/24 2359 Spouse/Significant Other           Med Note LEONARDO, KIMBERLY L   Thu Mar 04, 2024  4:12 PM) prn  Multiple Vitamin (MULTIVITAMIN WITH MINERALS) TABS tablet [id number]  Take 1 tablet by mouth daily. Mikhail, Maryann, DO  Active Spouse/Significant Other  mupirocin  ointment (BACTROBAN ) 2 % [id number]  APPLY TOPICALLY TWICE DAILY FOR  SKIN INFECTIONS [name], MD  Active Spouse/Significant Other  OXYGEN [id number]  Inhale 5.5 L/min into the lungs continuous. [provider]  Active  Spouse/Significant Other  pantoprazole  (PROTONIX ) 20 MG tablet [id number]  TAKE 1 TABLET BY MOUTH DAILY [name], MD  Active Spouse/Significant Other  predniSONE  (DELTASONE ) 10 MG tablet [id number]  Take 1 tablet (10 mg total) by mouth daily with breakfast. Geronimo Amel, MD  Active Spouse/Significant Other           Med Note LEONARDO, KIMBERLY L   Thu Mar 04, 2024  4:14 PM) Also taking the taper dose  rosuvastatin  (CRESTOR ) 40 MG tablet [id number]  Take 1 tablet (40 mg total) by mouth daily. [name], MD  Active Spouse/Significant Other  SPIRIVA  RESPIMAT 2.5 MCG/ACT AERS [id number]  USE 2 INHALATIONS BY MOUTH ONCE  DAILY Geronimo Amel, MD  Active Spouse/Significant Other  SYMBICORT  160-4.5 MCG/ACT inhaler [id number]  INHALE 2 INHALATIONS BY MOUTH  TWICE DAILY [name], MD  Active Spouse/Significant Other  SYSTANE PRESERVATIVE FREE 0.4-0.3 % SOLN [id number]  Place 1 drop into both eyes 2 (two) times daily. [provider]  Active Spouse/Significant Other  traMADol  (ULTRAM ) 50 MG tablet [id number]  Take 50 mg by mouth every 12 (twelve) hours as needed for severe pain. [provider]  Active Spouse/Significant Other           Med Note LEONARDO, KIMBERLY L   Thu Mar 04, 2024  4:15 PM) prn            Goals Addressed             This Visit's Progress    VBCI Transitions of Care (TOC) Care Plan       Problems:  Recent Hospitalization for treatment of cellulitis left arm Discharged on 03/08/2024 Chronic end stage COPD ( not reason for admission)  Caregiver stress 03/16/24- spoke with pt who reports  overall doing okay  finished antibiotics, reports left arm started swelling some yesterday  denies any other symptoms, no redness, no warmth, pt is not febrile, pt wants home health nurse to assess tomorrow 9/10 at her visit, does not want to make appointment with primary care provider 03/23/24- pt had telemedicine visit with primary care provider on  03/19/24 regarding left arm, swelling  is much better per pt, referral has been placed to hand specialist at [facility], pt cannot lift his left index finger, has hematoma to the left hand/ arm area, home health continues working with pt and provider assessment/ oversight 03/30/24- pt reports he saw hand surgeon, has a damaged tendon, declining surgery, edema to left hand/ arm remains resolved, hematoma  looks better per pt, home health continues   Goal:  Over the next 30 days, the patient will not experience hospital readmission  Interventions:  Transitions of Care: Doctor Visits  - discussed the importance of doctor visits Post-op wound/incision care reviewed with patient/caregiver Reviewed Signs and symptoms of infection Reviewed medications and importance of taking  as prescribed Reviewed caregiver stress as wife is full time care giver. Wife is considering getting some hired help. Reviewed COPD action plan Reviewed signs/ symptoms cellulitis/ infection, reportable signs /symptoms Reviewed upcoming scheduled appointments including pulmonary 04/12/24  Patient Self Care Activities:  Attend all scheduled provider appointments Call provider office for new concerns or questions  Notify RN Care Manager of [facility] call rescheduling needs Participate in Transition of Care Program/Attend TOC scheduled calls Take medications as prescribed   Monitor for signs of infection/ cellulitis Report any new symptoms to your doctor Continue working with home health Use oxygen as prescribed, be careful of tubing so you do not trip Follow COPD action plan  Plan:  Telephone follow up appointment with care management team member scheduled for:  04/06/24 @ 945 am         Recommendation:   PCP Follow-up Specialty provider follow-up pulmonary  Follow Up Plan:   Telephone follow-up 04/06/24 @ 945 am  Mliss Creed [facility], BSN RN Care Manager/ Transition of Care [place]/ [facility] Population  Health [phone]

## 2024-03-30 NOTE — Telephone Encounter (Signed)
 [facility] faxed Home Health Certificate (Order [place] [id number]), to be filled out by provider. Patient requested to send it back via Fax within ASAP. Document is located in providers tray at front office.Please advise at [phone].

## 2024-03-30 NOTE — Patient Instructions (Signed)
 Visit Information  Thank you for taking time to visit with me today. Please don't hesitate to contact me if I can be of assistance to you before our next scheduled telephone appointment.  Our next appointment is by telephone on 04/06/24 @ 945 am  Following is a copy of your care plan:   Goals Addressed             This Visit's Progress    VBCI Transitions of Care (TOC) Care Plan       Problems:  Recent Hospitalization for treatment of cellulitis left arm Discharged on 03/08/2024 Chronic end stage COPD ( not reason for admission)  Caregiver stress 03/16/24- spoke with pt who reports  overall doing okay  finished antibiotics, reports left arm started swelling some yesterday  denies any other symptoms, no redness, no warmth, pt is not febrile, pt wants home health nurse to assess tomorrow 9/10 at her visit, does not want to make appointment with primary care provider 03/23/24- pt had telemedicine visit with primary care provider on 03/19/24 regarding left arm, swelling  is much better per pt, referral has been placed to hand specialist at [facility], pt cannot lift his left index finger, has hematoma to the left hand/ arm area, home health continues working with pt and provider assessment/ oversight 03/30/24- pt reports he saw hand surgeon, has a damaged tendon, declining surgery, edema to left hand/ arm remains resolved, hematoma  looks better per pt, home health continues   Goal:  Over the next 30 days, the patient will not experience hospital readmission  Interventions:  Transitions of Care: Doctor Visits  - discussed the importance of doctor visits Post-op wound/incision care reviewed with patient/caregiver Reviewed Signs and symptoms of infection Reviewed medications and importance of taking as prescribed Reviewed caregiver stress as wife is full time care giver. Wife is considering getting some hired help. Reviewed COPD action plan Reviewed signs/ symptoms cellulitis/  infection, reportable signs /symptoms Reviewed upcoming scheduled appointments including pulmonary 04/12/24  Patient Self Care Activities:  Attend all scheduled provider appointments Call provider office for new concerns or questions  Notify RN Care Manager of [facility] call rescheduling needs Participate in Transition of Care Program/Attend TOC scheduled calls Take medications as prescribed   Monitor for signs of infection/ cellulitis Report any new symptoms to your doctor Continue working with home health Use oxygen as prescribed, be careful of tubing so you do not trip Follow COPD action plan  Plan:  Telephone follow up appointment with care management team member scheduled for:  04/06/24 @ 945 am        Patient verbalizes understanding of instructions and care plan provided today and agrees to view in MyChart. Active MyChart status and patient understanding of how to access instructions and care plan via MyChart confirmed with patient.     Telephone follow up appointment with care management team member scheduled for:  04/06/24 @ 945 am  Please call the care guide team at [phone] if you need to cancel or reschedule your appointment.   Please call the Suicide and Crisis Lifeline: 988 call the [place]  National Suicide Prevention Lifeline: 470-268-5799 or TTY: 667-694-2833 TTY 530 077 6945) to talk to a trained counselor call 1-800-273-TALK (toll free, 24 hour hotline) go to [facility] [address], [place] [phone]) call 911 if you are experiencing a Mental Health or Behavioral Health Crisis or need someone to talk to.  Mliss Creed RNC, BSN RN Care Manager/ Transition of Care  [place]/ [facility] Population Health [phone]

## 2024-03-31 ENCOUNTER — Ambulatory Visit: Payer: Self-pay | Admitting: Internal Medicine

## 2024-03-31 MED ORDER — DOXYCYCLINE HYCLATE 100 MG PO TABS: 100.0000 mg | ORAL_TABLET | Freq: Two times a day (BID) | ORAL | 0 refills | Status: DC

## 2024-03-31 MED ORDER — PREDNISONE 10 MG PO TABS: ORAL_TABLET | ORAL | 0 refills | Status: DC

## 2024-03-31 NOTE — Telephone Encounter (Signed)
 4 or 5 days COPD flare up Patient states that an antibiotic and Prednisone  taper is called in Denies fever or coughing up blood Sputum is green Patient was adamant about speaking directly with Dr Reeves nurse due to them being familiar with his medical history and previous flare ups. This RN did get some assessment information as mentioned above but patient states that he would like to specifically be called back by someone in the office. Patient is advised that if anything worsens to go to the Emergency Room. Patient verbalized understanding.   FYI Only or Action Required?: Action required by provider: clinical question for provider and update on patient condition.  Patient is followed in Pulmonology for COPD, last seen on 01/14/2024 by Geronimo Amel, MD.  Called Nurse Triage reporting Cough.  Symptoms began 4 or 5 days ago.  Interventions attempted: Other: patient didn't advise.  Symptoms are: unchanged.  Triage Disposition: No disposition on file.  Patient/caregiver understands and will follow disposition?:  Answer Assessment - Initial Assessment Questions 4 or 5 days COPD flare up Patient states that an antibiotic and Prednisone  taper is called in Denies fever or coughing up blood Sputum is green Patient was adamant about speaking directly with Dr Reeves nurse due to them being familiar with his medical history and previous flare ups. He states that it will save This RN did get some assessment information as mentioned above but patient states that he would like to specifically be called back by someone in the office. Patient is advised that if anything worsens to go to the Emergency Room. Patient verbalized understanding.  Protocols used: Information Only Call - No Triage-A-AH

## 2024-03-31 NOTE — Telephone Encounter (Signed)
 Please treat w pred taper > Take 40mg  daily for 3 days, then 30mg  daily for 3 days, then 20mg  daily for 3 days, then 10mg  daily for 3 days, then stop  Doxycycline  100mg  bid x 7d

## 2024-03-31 NOTE — Telephone Encounter (Signed)
 I called and spoke with the pt's spouse and notified of response per Dr. Shelah. She verbalized understanding. Rxs were sent to preferred pharm. Nothing further needed.

## 2024-03-31 NOTE — Telephone Encounter (Signed)
 Called and spoke with the pt regarding msg below  He is having wheezing and increased cough with green sputum x 4-5 days  He has had slight increase in SOB  Denies any fevers/aches, sore throat, ear pain  He feels this is a regular flare  Sats 97% 5lpm o2  He declined acute ov for tomorrow  MR- please advise, thanks   Note that he is still taking symbicort , spiriva , ohtuvayre , mucinex , duoneb   Allergies  Allergen Reactions   Tape Other (See Comments)    Skin tears easily   Augmentin  [Amoxicillin -Pot Clavulanate] Other (See Comments)    Thrush Sore throat Hoarse voice   Bactrim  [Sulfamethoxazole -Trimethoprim ] Nausea Only    Also feels weak   Ciprofloxacin  Other (See Comments)    hallucinations   Gadavist  [Gadobutrol ] Other (See Comments)    Unknown reaction   Gadolinium Itching, Rash and Other (See Comments)    Itchy back rash   Lipitor [Atorvastatin ] Rash

## 2024-03-31 NOTE — Telephone Encounter (Signed)
 Completed and faxed back

## 2024-04-02 ENCOUNTER — Telehealth: Payer: Self-pay | Admitting: Family Medicine

## 2024-04-02 NOTE — Telephone Encounter (Signed)
 HH FORMS received via fax Type of Form: HH _[facility]______ GREEN charge sheet attached and placed in provider folder at front desk.

## 2024-04-02 NOTE — Telephone Encounter (Signed)
Form placed in providers box 

## 2024-04-06 ENCOUNTER — Other Ambulatory Visit: Payer: Self-pay | Admitting: *Deleted

## 2024-04-06 NOTE — Patient Instructions (Signed)
 Visit Information  Thank you for taking time to visit with me today. Please don't hesitate to contact me if I can be of assistance to you before our next scheduled telephone appointment.  Our next appointment is no further scheduled appointments.    Following is a copy of your care plan:   Goals Addressed             This Visit's Progress    COMPLETED: VBCI Transitions of Care (TOC) Care Plan       Problems:  Recent Hospitalization for treatment of cellulitis left arm Discharged on 03/08/2024 Chronic end stage COPD ( not reason for admission)  Caregiver stress 03/16/24- spoke with pt who reports  overall doing okay  finished antibiotics, reports left arm started swelling some yesterday  denies any other symptoms, no redness, no warmth, pt is not febrile, pt wants home health nurse to assess tomorrow 9/10 at her visit, does not want to make appointment with primary care provider 03/23/24- pt had telemedicine visit with primary care provider on 03/19/24 regarding left arm, swelling  is much better per pt, referral has been placed to hand specialist at [facility], pt cannot lift his left index finger, has hematoma to the left hand/ arm area, home health continues working with pt and provider assessment/ oversight 03/30/24- pt reports he saw hand surgeon, has a damaged tendon, declining surgery, edema to left hand/ arm remains resolved, hematoma  looks better per pt, home health continues  04/06/24- pt reports overall doing well, taking antibiotic after calling nurse triage 9/24 with pulmonary, had wheezing, productive cough, increased dyspnea, pt states I feel much better, no new concerns reported  Goal:  Over the next 30 days, the patient will not experience hospital readmission  Interventions:  Transitions of Care: Doctor Visits  - discussed the importance of doctor visits Post-op wound/incision care reviewed with patient/caregiver Reviewed Signs and symptoms of infection Reviewed  medications and importance of taking as prescribed Reviewed caregiver stress as wife is full time care giver. Wife is considering getting some hired help. Reinforced COPD action plan Reinforced signs/ symptoms cellulitis/ infection, reportable signs /symptoms Reviewed upcoming scheduled appointments including pulmonary 04/12/24 Reviewed plan of care with pt including TOC case closure, pt declines transfer to RN longitudinal case management (states he will call if he needs something)  Patient Self Care Activities:  Attend all scheduled provider appointments Call provider office for new concerns or questions  Notify RN Care Manager of TOC call rescheduling needs Participate in Transition of Care Program/Attend TOC scheduled calls Take medications as prescribed   Monitor for signs of infection/ cellulitis Report any new symptoms to your doctor Continue working with home health Use oxygen as prescribed, be careful of tubing so you do not trip Follow COPD action plan, call your doctor for change in health status, symptoms  Plan:  No further follow up required: case closure, pt declines further follow up        Patient verbalizes understanding of instructions and care plan provided today and agrees to view in MyChart. Active MyChart status and patient understanding of how to access instructions and care plan via MyChart confirmed with patient.     No further follow up required: case closure  Please call the care guide team at [phone] if you need to cancel or reschedule your appointment.   Please call the Suicide and Crisis Lifeline: 988 call the [place]  National Suicide Prevention Lifeline: 817 216 3372 or TTY: 564-291-3952 TTY 4780447246) to talk to  a trained counselor call 1-800-273-TALK (toll free, 24 hour hotline) go to [facility] Urgent [facility] [address], [place] [phone]) call 911 if you are experiencing a Mental Health or Behavioral Health  Crisis or need someone to talk to.  Mliss Creed [facility], BSN RN Care Manager/ Transition of Care [place]/ [facility] [phone]

## 2024-04-06 NOTE — Patient Outreach (Signed)
 Transition of Care week 6  Visit Note  04/06/2024  Name: Francisco [name] MRN: [id number]          DOB: 01-11-1946  Situation: Patient enrolled in [facility] 30-day program. Visit completed with patient by telephone.   Background:    Past Medical History:  Diagnosis Date   Acute GI bleeding 02/18/2020   Melena with hemoglobin down to 10 February 2020.  EGD with intervention-follow-up with Dr. Wilhelmenia     Allergy     Blood transfusion without reported diagnosis    CHF (congestive heart failure) (HCC) 2024   [facility] rehab told us    Chronic back pain    mid and lower (04/07/2018)   Chronic rhinitis    -Sinus Ct 08/01/2009 >> Bilateral maxillary sinusitis with some mucosal thickeningin the sphenoid and frontal sinuses as well with air fluid levels present -chronic rhinitis flyer Aug 04, 2009   Clotting disorder 2022@2024    Internal bleed   Compressed spine fracture (HCC)    COPD (chronic obstructive pulmonary disease) (HCC)    PFT's rec Jul 17, 2009   Dyspnea    Emphysema lung (HCC)    Emphysema of lung (HCC)    GERD (gastroesophageal reflux disease)    Hyperglycemia 06/12/2016   Hypertension 2/24   Questionable   Iron  deficiency anemia due to chronic blood loss 08/23/2020   Left ankle swelling 07/22/2014   HTN- controlled when fluid status contolled. on triamterene -hctz 37.5-25mg  sparingly  Unclear etiology.   LLE U/S 2016:     Leukocytosis 12/19/2016   Melena 08/11/2022   On home oxygen therapy    3L; 24/7 (04/07/2018)   Onychomycosis    Dr. Naomi   Orthostatic hypotension    since 10/2017 (04/07/2018)   Osteoporosis    Oxygen deficiency    PAD (peripheral artery disease)    Pneumonia    twice in 1 year (04/07/2018)   Pulmonary embolism (HCC) 04/07/2018   Skin cancer    lips, face, ears, arms (04/07/2018)   Small cell lung cancer, right (HCC) 2023   Dr. Dewey.  rad   Vertigo    since ~ 02/2018 (04/07/2018)    Assessment: Patient Reported  Symptoms: Cognitive Cognitive Status: No symptoms reported, Alert and oriented to person, place, and time, Normal speech and language skills, Able to follow simple commands      Neurological Neurological Review of Symptoms: No symptoms reported    HEENT HEENT Symptoms Reported: No symptoms reported      Cardiovascular Cardiovascular Symptoms Reported: Swelling in legs or feet (chronic) Does patient have uncontrolled Hypertension?: No Cardiovascular Management Strategies: Medication therapy, Adequate rest Cardiovascular Self-Management Outcome: 4 (good)  Respiratory Respiratory Symptoms Reported: Shortness of breath, Productive cough Other Respiratory Symptoms: chronic dyspnea with exertion, on oxygen 5.5 liters continuously,  end stage COPD Additional Respiratory Details: reinforced COPD action plan, pt is on antibiotic after calling triage at pulmonary on 9/24, pt states he feels much better, does not have antibiotic in front of him, does not know name of the medication, still has slightly productive cough Respiratory Management Strategies: Adequate rest, Oxygen therapy, Routine screening, Medication therapy Respiratory Self-Management Outcome: 3 (uncertain)  Endocrine Endocrine Symptoms Reported: No symptoms reported Is patient diabetic?: No    Gastrointestinal Gastrointestinal Symptoms Reported: No symptoms reported Other Gastrointestinal Symptoms: uses stool softeners regularly, has BM usually QOD Gastrointestinal Management Strategies: Medication therapy Gastrointestinal Self-Management Outcome: 4 (good)    Genitourinary Genitourinary Symptoms Reported: No symptoms reported    Integumentary  Integumentary Symptoms Reported: No symptoms reported Other Integumentary Symptoms: pt states left arm edema continues to be resolved Skin Management Strategies: Routine screening Skin Self-Management Outcome: 4 (good) Skin Comment: Reviewed signs / symptoms of infection  Musculoskeletal  Musculoskelatal Symptoms Reviewed: Difficulty walking, Other Other Musculoskeletal Symptoms: left index finger- pt still cannot lift finger, is declining surgery Musculoskeletal Management Strategies: Routine screening Musculoskeletal Self-Management Outcome: 4 (good) Musculoskeletal Comment: reviewed safety precautions, pt denies any recent falls      Psychosocial Psychosocial Symptoms Reported: No symptoms reported         There were no vitals filed for this visit.  Medications Reviewed Today     Reviewed by Aura Mliss LABOR, RN (Registered Nurse) on 04/06/24 at 1013  Med List Status: <None>   Medication Order Taking? Sig Documenting Provider Last Dose Status Informant  acetaminophen  (TYLENOL ) 500 MG tablet [id number]  Take 500 mg by mouth every 4 (four) hours as needed for moderate pain, fever, headache or mild pain. [provider]  Active Spouse/Significant Other  albuterol  (VENTOLIN  HFA) 108 (90 Base) MCG/ACT inhaler [id number]  Inhale 2 puffs into the lungs every 6 (six) hours as needed for wheezing or shortness of breath. [provider]  Active Spouse/Significant Other           Med Note LEONARDO, KIMBERLY L   Thu Mar 04, 2024  4:08 PM) prn  azithromycin  (ZITHROMAX ) 250 MG tablet [id number]  TAKE 1 TABLET BY MOUTH 3 TIMES  WEEKLY  Patient not taking: Reported on 03/19/2024   Geronimo Amel, MD  Active Spouse/Significant Other           Med Note LEONARDO, [facility] L   Thu Mar 04, 2024  4:08 PM) On pause until finish other antibiotics  Calcium  Carb-Cholecalciferol  (CALCIUM  + D3 PO) [id number]  Take 1 tablet by mouth daily with breakfast. [provider]  Active Spouse/Significant Other  docusate sodium  (COLACE) 100 MG capsule [id number]  Take 2 capsules (200 mg total) by mouth 2 (two) times daily. Elgergawy, Brayton RAMAN, MD  Active Spouse/Significant Other  doxycycline  (VIBRA -TABS) 100 MG tablet [id number]  Take 1 tablet (100 mg total) by mouth 2 (two) times  daily. Shelah Lamar RAMAN, MD  Active   ELIQUIS  2.5 MG TABS tablet [id number]  TAKE 1 TABLET BY MOUTH TWICE  DAILY [name], MD  Active Spouse/Significant Other  Ensifentrine  (OHTUVAYRE ) 3 MG/2.5ML SUSP [id number]  INHALE THE CONTENTS OF 1 AMPULE VIA NEBULIZER IN THE MORNING AND IN THE KARNA Geronimo, Amel, MD  Active Spouse/Significant Other  Ferrous Sulfate  (IRON ) 325 (65 Fe) MG TABS [id number]  Take 325 mg by mouth See admin instructions. Take 325 mg by mouth with food every other day  Patient taking differently: Take 325 mg by mouth See admin instructions. Take 325 mg by mouth with food every other day   [provider]  Active Spouse/Significant Other  fexofenadine (ALLEGRA) 180 MG tablet [id number]  Take 180 mg by mouth daily. [provider]  Active Spouse/Significant Other  fluticasone  (FLONASE ) 50 MCG/ACT nasal spray [id number]  USE 2 SPRAYS IN BOTH NOSTRILS  DAILY [name], MD  Active Spouse/Significant Other  folic acid  (FOLVITE ) 1 MG tablet [id number]  Take 1 tablet (1 mg total) by mouth daily. Mikhail, Maryann, DO  Active Spouse/Significant Other  furosemide  (LASIX ) 20 MG tablet [id number]  TAKE 1 TABLET BY MOUTH DAILY AS  NEEDED FOR FLUID OR EDEMA  Patient taking differently: Take 20 mg by mouth daily.  [name], MD  Active Spouse/Significant Other  guaiFENesin  (MUCINEX  PO) [id number]  Take 1,200 mg by mouth daily. [provider]  Active Spouse/Significant Other  ipratropium-albuterol  (DUONEB) 0.5-2.5 (3) MG/3ML SOLN [id number]  TAKE 3 MLS BY NEBULIZATION 3 (THREE) TIMES DAILY. Hope Almarie ORN, NP  Expired 03/19/24 2359 Spouse/Significant Other           Med Note LEONARDO, KIMBERLY L   Thu Mar 04, 2024  4:12 PM) prn  Multiple Vitamin (MULTIVITAMIN WITH MINERALS) TABS tablet [id number]  Take 1 tablet by mouth daily. Mikhail, Maryann, DO  Active Spouse/Significant Other  mupirocin  ointment (BACTROBAN ) 2 % [id number]  APPLY TOPICALLY  TWICE DAILY FOR  SKIN INFECTIONS [name], MD  Active Spouse/Significant Other  OXYGEN [id number]  Inhale 5.5 L/min into the lungs continuous. [provider]  Active Spouse/Significant Other  pantoprazole  (PROTONIX ) 20 MG tablet [id number]  TAKE 1 TABLET BY MOUTH DAILY [name], MD  Active Spouse/Significant Other  predniSONE  (DELTASONE ) 10 MG tablet [id number]  Take 1 tablet (10 mg total) by mouth daily with breakfast. Geronimo Amel, MD  Active Spouse/Significant Other           Med Note LEONARDO, KIMBERLY L   Thu Mar 04, 2024  4:14 PM) Also taking the taper dose  predniSONE  (DELTASONE ) 10 MG tablet [id number]  4 x 3 days, 3 x 3 days, 2 x 3 days, 1 x 3 days then stop Byrum, Robert S, MD  Active   rosuvastatin  (CRESTOR ) 40 MG tablet [id number]  Take 1 tablet (40 mg total) by mouth daily. [name], MD  Active Spouse/Significant Other  SPIRIVA  RESPIMAT 2.5 MCG/ACT AERS [id number]  USE 2 INHALATIONS BY MOUTH ONCE  DAILY Geronimo Amel, MD  Active Spouse/Significant Other  SYMBICORT  160-4.5 MCG/ACT inhaler [id number]  INHALE 2 INHALATIONS BY MOUTH  TWICE DAILY [name], MD  Active Spouse/Significant Other  SYSTANE PRESERVATIVE FREE 0.4-0.3 % SOLN [id number]  Place 1 drop into both eyes 2 (two) times daily. [provider]  Active Spouse/Significant Other  traMADol  (ULTRAM ) 50 MG tablet [id number]  Take 50 mg by mouth every 12 (twelve) hours as needed for severe pain. [provider]  Active Spouse/Significant Other           Med Note LEONARDO, KIMBERLY L   Thu Mar 04, 2024  4:15 PM) prn            Goals Addressed             This Visit's Progress    COMPLETED: [facility] Transitions of Care (TOC) Care Plan       Problems:  Recent Hospitalization for treatment of cellulitis left arm Discharged on 03/08/2024 Chronic end stage COPD ( not reason for admission)  Caregiver stress 03/16/24- spoke with pt who reports  overall doing okay   finished antibiotics, reports left arm started swelling some yesterday  denies any other symptoms, no redness, no warmth, pt is not febrile, pt wants home health nurse to assess tomorrow 9/10 at her visit, does not want to make appointment with primary care provider 03/23/24- pt had telemedicine visit with primary care provider on 03/19/24 regarding left arm, swelling  is much better per pt, referral has been placed to hand specialist at [facility], pt cannot lift his left index finger, has hematoma to the left hand/ arm area, home health continues working with pt and provider assessment/ oversight 03/30/24- pt reports he saw hand surgeon, has a damaged tendon,  declining surgery, edema to left hand/ arm remains resolved, hematoma  looks better per pt, home health continues  04/06/24- pt reports overall doing well, taking antibiotic after calling nurse triage 9/24 with pulmonary, had wheezing, productive cough, increased dyspnea, pt states I feel much better, no new concerns reported  Goal:  Over the next 30 days, the patient will not experience hospital readmission  Interventions:  Transitions of Care: Doctor Visits  - discussed the importance of doctor visits Post-op wound/incision care reviewed with patient/caregiver Reviewed Signs and symptoms of infection Reviewed medications and importance of taking as prescribed Reviewed caregiver stress as wife is full time care giver. Wife is considering getting some hired help. Reinforced COPD action plan Reinforced signs/ symptoms cellulitis/ infection, reportable signs /symptoms Reviewed upcoming scheduled appointments including pulmonary 04/12/24 Reviewed plan of care with pt including TOC case closure, pt declines transfer to RN longitudinal case management (states he will call if he needs something)  Patient Self Care Activities:  Attend all scheduled provider appointments Call provider office for new concerns or questions  Notify RN Care  Manager of TOC call rescheduling needs Participate in Transition of Care Program/Attend TOC scheduled calls Take medications as prescribed   Monitor for signs of infection/ cellulitis Report any new symptoms to your doctor Continue working with home health Use oxygen as prescribed, be careful of tubing so you do not trip Follow COPD action plan, call your doctor for change in health status, symptoms  Plan:  No further follow up required: case closure, pt declines further follow up         Recommendation:   PCP Follow-up Specialty provider follow-up pulmonary  Follow Up Plan:   Closing From:  Transitions of Care Program  Mliss Creed [facility], BSN RN Care Manager/ Transition of Care [place]/ [facility] [phone]

## 2024-04-12 ENCOUNTER — Encounter: Payer: Self-pay | Admitting: Internal Medicine

## 2024-04-12 ENCOUNTER — Telehealth: Admitting: Internal Medicine

## 2024-04-12 VITALS — Ht 72.0 in | Wt 164.0 lb

## 2024-04-12 DIAGNOSIS — J329 Chronic sinusitis, unspecified: Secondary | ICD-10-CM | POA: Diagnosis not present

## 2024-04-12 DIAGNOSIS — J9611 Chronic respiratory failure with hypoxia: Secondary | ICD-10-CM

## 2024-04-12 DIAGNOSIS — Z7189 Other specified counseling: Secondary | ICD-10-CM

## 2024-04-12 DIAGNOSIS — J9612 Chronic respiratory failure with hypercapnia: Secondary | ICD-10-CM | POA: Diagnosis not present

## 2024-04-12 MED ORDER — AZELASTINE HCL 0.1 % NA SOLN: 2.0000 | Freq: Two times a day (BID) | NASAL | 6 refills | Status: AC

## 2024-04-12 NOTE — Patient Instructions (Addendum)
 ICD-10-CM   1. Chronic respiratory failure with hypoxia and hypercapnia (HCC)  J96.11    J96.12     2. Chronic sinusitis, unspecified location  J32.9     3. Goals of care, counseling/discussion  Z71.89        Ongoing or recurrent flareups without respect.  Currently more stable especially after starting night BiPAP Ongoing sinus drainage issues despite Flonase  and Allegra   Recommendations - Add Astelin nasal inhaler [recent [place] study showing benefit in preventing respiratory infections] - - Continue Ohtuvayre  nebulizer morning and evening  - - Continue -5.5L oxygen to maintain O2 >88-90%  - Continue noninvasive positive pressure ventilation at night-via ventilator with seems to be working better for you - Continue other treatment for COPD etc. -You do not need PFTs anymore  Follow-up: 3 months video visit with nurse practitioner 6 months video visit Dr. Geronimo

## 2024-04-12 NOTE — Progress Notes (Signed)
 OV 10/13/2023  Subjective:  Patient ID: Francisco [name], male , DOB: 1946/05/14 , age 78 y.o. , MRN: [id number] , ADDRESS: [address]  [place] [address] PCP Francisco Garnette KIDD, MD Patient Care Team: Francisco Garnette KIDD, MD as PCP - General (Family Medicine) Francisco Amel, MD as Consulting Physician (Pulmonary Disease) Francisco Sieving, MD as Consulting Physician (Dermatology) Dr. Alm [name], DDS (Dentistry)  This Provider for this visit: Treatment Team:  Attending Provider: Geronimo Amel, MD   Type of visit: Video Virtual Visit Identification of patient Francisco [name] with 1945/12/17 and MRN [id number] - 2 person identifier Risks: Risks, benefits, limitations of telephone visit explained. Patient understood and verbalized agreement to proceed Anyone else on call: His wife is present on the call Patient location: His home This provider location: [address]; [place]; [place] [address]. Francisco [name]. [phone]    10/13/2023 -   Chief Complaint  Patient presents with   Follow-up    Breathing is doing about the same. He has wheezing and cough with brown sputum.    COPD Hoispice care  HPI Francisco [name] 78 y.o. - using NIPVV via Vent. Says the Vent machine is beter than std biPAP. On Ohtuvayre  - and is helping . OVerall strength is bettter.  SAys is baseline with 1 cup of sputum per day - brownish. Off cipro . On 10mg  daily prednisone . On BD. On 5.5L O2 24/7 . Flxied rate.  Says on 10/21/23 - ABG prderd and pn schedule at [facility].  He is really worried about getting PFTs on that day.  But otherwise he is stable.    HAd MRI brain -> going to see neuro due to cervical disease. Neurosurgery Dr. Onetha patient scheduled for consult 10/14/23.  - Herniated disc   PFT      No data to display             LAB RESULTS last 96 hours No results found.   IMPRESSION: 1. No evidence of metastatic disease. Today's study  was ordered and performed without contrast. This could limit detection of tiny metastases, but there is no specific evidence suggesting that. Patient has not had metastatic disease in the past. 2. Chronic small-vessel ischemic changes of the pons, thalami, basal ganglia and cerebral hemispheric white matter. 3. 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 Patient would be at risk of compressive myelopathy. Therefore, depending upon the overall clinical condition of the patient, MRI of the cervical spine would be suggested for further evaluation.     Electronically Signed   By: Francisco [name] M.D.   On: 09/09/2023 15:37     OV 01/14/2024  Subjective:  Patient ID: Francisco [name], male , DOB: 1946-01-15 , age 78 y.o. , MRN: [id number] , ADDRESS: [address] [place] [address] PCP Francisco Garnette KIDD, MD Patient Care Team: Francisco Garnette KIDD, MD as PCP - General (Family Medicine) Francisco Amel, MD as Consulting Physician (Pulmonary Disease) Francisco Sieving, MD as Consulting Physician (Dermatology) Dr. Alm [name], DDS (Dentistry)  This Provider for this visit: Treatment Team:  Attending Provider: Geronimo Amel, MD    01/14/2024 -   Chief Complaint  Patient presents with   Follow-up    Breathing has been terrible- worse over the past 2 wks. He c/o wheezing and cough with brown to green. He denies any fevers but has had some back aches.  Type of visit: Video Virtual Visit Identification of patient Francisco [name] with [birth year] and MRN [id number] - 2 person identifier Risks: Risks, benefits, limitations of telephone visit explained. Patient understood and verbalized agreement to proceed Anyone else on call: his wife Patient location:  his wife This provider location: [address], [address]; [place]; [place] [address]. [facility] [name]. [phone]  HPI Francisco [name] 78 y.o. -   - :  HAs new bruising  left side buttock and down left leg and to left knee. No falls. No bleeding. BUT on eliquis    - incrased sleep x 1 week  - blood work yesterday  - co2 43 but abg in April 2025: improved  - worsened anemia - now hgb 8s (proior 10) (on irone pill every other day)  - CT chest - new T3 compression fracture   - wife feels he is very weak  - Hospice  - currently he is NOT in hospice  0 wufe reports worsneing care giver burden - reports recently his bp was low and she strugged with it and was exhasitng  - he is reluctant on hospice because he is worried he will lose me and PCP Francisco Garnette KIDD, MD\ as doctor PFT   OV 04/12/2024  Subjective:  Patient ID: Francisco [name], male , DOB: 14-Nov-1945 , age 78 y.o. , MRN: [id number] , ADDRESS: [address] [place] [address] PCP Francisco Garnette KIDD, MD Patient Care Team: Francisco Garnette KIDD, MD as PCP - General (Family Medicine) Francisco Amel, MD as Consulting Physician (Pulmonary Disease) Francisco Sieving, MD as Consulting Physician (Dermatology) Dr. Alm [name], DDS (Dentistry) Francisco Mliss LABOR, RN as [facility] Care Management  This Provider for this visit: Treatment Team:  Attending Provider: Geronimo Amel, MD    04/12/2024 -   Chief Complaint  Patient presents with   Medical Management of Chronic Issues    Pt had flare up 9/24, meds helped.  Sinus issues w/ congestion.  Overall improving    Type of visit: Video Virtual Visit Identification of patient Francisco [name] with [birth year] and MRN [id number] - 2 person identifier Risks: Risks, benefits, limitations of telephone visit explained. Patient understood and verbalized agreement to proceed Anyone else on call: his wife Patient location: his home This provider location: [address]; [place]; [place] [address]. [facility] [name]. [phone]   HPI Francisco [name] 78 y.o. -in this video visit he joins with his wife.  Since  his last video visit in July 2025 he had a respiratory flareup exacerbation with high pCO2.  After that he called again for COPD exacerbation was given doxycycline .  He is fully aware about his advanced lung disease and he joked that my neck step is mummification.  He does not want to do home hospice.  He says the night BiPAP has helped him.  He is a little more stable right now.  His main complaint today is sinus drainage.  We discussed new findings from [place] and a phase 2 study where inhaled Astelin prevented COVID infections.  He is willing to try this.  This been sent to [facility] pharmacy.  He will let us  know if there is any cost issues etc.      LAB RESULTS last 96 hours No results found.       has a past medical history of Acute GI bleeding (02/18/2020), Allergy , Blood transfusion without reported diagnosis,  CHF (congestive heart failure) (HCC) (2024), Chronic back pain, Chronic rhinitis, Clotting disorder (2022@2024 ), Compressed spine fracture (HCC), COPD (chronic obstructive pulmonary disease) (HCC), Dyspnea, Emphysema lung (HCC), Emphysema of lung (HCC), GERD (gastroesophageal reflux disease), Hyperglycemia (06/12/2016), Hypertension (2/24), Iron  deficiency anemia due to chronic blood loss (08/23/2020), Left ankle swelling (07/22/2014), Leukocytosis (12/19/2016), Melena (08/11/2022), On home oxygen therapy, Onychomycosis, Orthostatic hypotension, Osteoporosis, Oxygen deficiency, PAD (peripheral artery disease), Pneumonia, Pulmonary embolism (HCC) (04/07/2018), Skin cancer, Small cell lung cancer, right (HCC) (2023), and Vertigo.   reports that he quit smoking about 6 years ago. His smoking use included cigarettes and pipe. He started smoking about 58 years ago. He has a 104 pack-year smoking history. He has never been exposed to tobacco smoke. He has never used smokeless tobacco.  Past Surgical History:  Procedure Laterality Date   ABDOMINAL AORTOGRAM W/LOWER EXTREMITY N/A 01/21/2020    Procedure: ABDOMINAL AORTOGRAM W/LOWER EXTREMITY;  Surgeon: Harvey Carlin BRAVO, MD;  [facility];  Service: Cardiovascular;  Laterality: N/A;   APPENDECTOMY  1998   BIOPSY  02/20/2020   Procedure: BIOPSY;  Surgeon: Wilhelmenia Aloha Raddle., MD;  Location: [facility] ENDOSCOPY;  Service: Gastroenterology;;   BIOPSY  08/12/2022   Procedure: BIOPSY;  Surgeon: San Sandor GAILS, DO;  Location: [facility] ENDOSCOPY;  Service: Gastroenterology;;   BRONCHIAL BIOPSY  09/04/2021   Procedure: BRONCHIAL BIOPSIES;  Surgeon: Brenna Adine CROME, DO;  Location: [facility] ENDOSCOPY;  Service: Pulmonary;;   BRONCHIAL BRUSHINGS  09/04/2021   Procedure: BRONCHIAL BRUSHINGS;  Surgeon: Brenna Adine CROME, DO;  Location: [facility] ENDOSCOPY;  Service: Pulmonary;;   BRONCHIAL NEEDLE ASPIRATION BIOPSY  09/04/2021   Procedure: BRONCHIAL NEEDLE ASPIRATION BIOPSIES;  Surgeon: Brenna Adine CROME, DO;  Location: [facility] ENDOSCOPY;  Service: Pulmonary;;   CATARACT EXTRACTION, BILATERAL     CHOLECYSTECTOMY     ENDARTERECTOMY FEMORAL Right 04/19/2019   Procedure: ENDARTERECTOMY RIGHT COMMON FEMORAL;  Surgeon: Harvey Carlin BRAVO, MD;  Location: [facility] OR;  Service: Vascular;  Laterality: Right;   ENDARTERECTOMY FEMORAL Left 01/24/2020   Procedure: LEFT FEMORAL ENDARTERECTOMY WITH DACRON PATCH ANGIOPLASTY;  Surgeon: Harvey Carlin BRAVO, MD;  Location: [facility] OR;  Service: Vascular;  Laterality: Left;   ESOPHAGOGASTRODUODENOSCOPY (EGD) WITH PROPOFOL  N/A 02/20/2020   Procedure: ESOPHAGOGASTRODUODENOSCOPY (EGD) WITH PROPOFOL ;  Surgeon: Wilhelmenia Aloha Raddle., MD;  Location[facility] ENDOSCOPY;  Service: Gastroenterology;  Laterality: N/A;   ESOPHAGOGASTRODUODENOSCOPY (EGD) WITH PROPOFOL  N/A 08/12/2022   Procedure: ESOPHAGOGASTRODUODENOSCOPY (EGD) WITH PROPOFOL ;  Surgeon: San Sandor GAILS, DO;  Location[facility] ENDOS[facility];  Service: Gastroenterology;  Laterality: N/A;   EYE SURGERY  2019   Cataract   FEMORAL ENDARTERECTOMY Left 01/24/2020   FIDUCIAL MARKER PLACEMENT   09/04/2021   Procedure: FIDUCIAL MARKER PLACEMENT;  Surgeon: Brenna Adine CROME, DO;  Location: [facility] ENDOSCOPY;  Service: Pulmonary;;   FRACTURE SURGERY  2019   HEMOSTASIS CLIP PLACEMENT  02/20/2020   Procedure: HEMOSTASIS CLIP PLACEMENT;  Surgeon: Wilhelmenia Aloha Raddle., MD;  Location: [facility] ENDOSCOPY;  Service: Gastroenterology;;   HEMOSTASIS CLIP PLACEMENT  08/12/2022   Procedure: HEMOSTASIS CLIP PLACEMENT;  Surgeon: San Sandor GAILS, DO;  Location[facility] ENDOS[facility];  Service: Gastroenterology;;   HOT HEMOSTASIS N/A 02/20/2020   Procedure: HOT HEMOSTASIS (ARGON PLASMA COAGULATION/BICAP);  Surgeon: Wilhelmenia Aloha Raddle., MD;  Location: [facility] ENDOSCOPY;  Service: Gastroenterology;  Laterality: N/A;   HOT HEMOSTASIS N/A 08/12/2022   Procedure: HOT HEMOSTASIS (ARGON PLASMA COAGULATION/BICAP);  Surgeon: San Sandor GAILS, DO;  Location: [facility] ENDOSCOPY;  Service: Gastroenterology;  Laterality: N/A;   INSERTION OF ILIAC  STENT Left 01/24/2020   Procedure: INSERTION OF VBX STENT 8X59 AND 8X39 LEFT COMMON ILIAC ARTERY. INSERTION OF INNOVA 7 X 60 INNOVA STENT LEFT EXTERNAL ILIAC ARTERY. ;  Surgeon: Harvey Carlin BRAVO, MD;  Location: [facility] OR;  Service: Vascular;  Laterality: Left;   LOWER EXTREMITY ANGIOGRAPHY  12/11/2018   LOWER EXTREMITY ANGIOGRAPHY N/A 12/11/2018   Procedure: LOWER EXTREMITY ANGIOGRAPHY;  Surgeon: Harvey Carlin BRAVO, MD;  [facility];  Service: Cardiovascular;  Laterality: N/A;   PATCH ANGIOPLASTY Right 04/19/2019   Procedure: Patch Angioplasty;  Surgeon: Harvey Carlin BRAVO, MD;  Location: [facility] OR;  Service: Vascular;  Laterality: Right;   PERIPHERAL VASCULAR INTERVENTION Right 12/11/2018   Procedure: PERIPHERAL VASCULAR INTERVENTION;  Surgeon: Harvey Carlin BRAVO, MD;  [facility];  Service: Cardiovascular;  Laterality: Right;  Common Iliac    SKIN CANCER EXCISION     lips, face, ears, arms (04/07/2018)   SUBMUCOSAL INJECTION  08/12/2022   Procedure: SUBMUCOSAL  INJECTION;  Surgeon: San Sandor GAILS, DO;  Location: [facility] ENDOSCOPY;  Service: Gastroenterology;;   TONSILLECTOMY     ULTRASOUND GUIDANCE FOR VASCULAR ACCESS Right 01/24/2020   Procedure: ULTRASOUND GUIDANCE FOR VASCULAR ACCESS;  Surgeon: Harvey Carlin BRAVO, MD;  Location[facility] OR;  Service: Vascular;  Laterality: Right;   VIDEO BRONCHOSCOPY WITH RADIAL ENDOBRONCHIAL ULTRASOUND  09/04/2021   Procedure: VIDEO BRONCHOSCOPY WITH RADIAL ENDOBRONCHIAL ULTRASOUND;  Surgeon: Brenna Adine CROME, DO;  Location: [facility] ENDOSCOPY;  Service: Pulmonary;;    Allergies  Allergen Reactions   Tape Other (See Comments)    Skin tears easily   Augmentin  [Amoxicillin -Pot Clavulanate] Other (See Comments)    Thrush Sore throat Hoarse voice   Bactrim  [Sulfamethoxazole -Trimethoprim ] Nausea Only    Also feels weak   Ciprofloxacin  Other (See Comments)    hallucinations   Gadavist  [Gadobutrol ] Other (See Comments)    Unknown reaction   Gadolinium Itching, Rash and Other (See Comments)    Itchy back rash   Lipitor [Atorvastatin ] Rash    Immunization History  Administered Date(s) Administered   Fluad Quad(high Dose 65+) 03/16/2019, 03/10/2020   Fluad Trivalent(High Dose 65+) 03/14/2023   INFLUENZA, HIGH DOSE SEASONAL PF 05/01/2017, 03/31/2018, 04/17/2021, 04/03/2022, 03/11/2024   Influenza Split 04/24/2012   Influenza Whole 05/08/2009, 04/08/2011   Influenza,inj,Quad PF,6+ Mos 04/27/2013, 04/15/2014, 04/24/2015   Influenza,inj,quad, With Preservative 05/01/2018   Influenza-Unspecified 05/13/2016   PFIZER Comirnaty(Gray Top)Covid-19 Tri-Sucrose Vaccine 11/24/2020, 04/03/2022, 03/17/2023   PFIZER(Purple Top)SARS-COV-2 Vaccination 07/30/2019, 08/20/2019, 04/07/2020   Pfizer Covid-19 Vaccine Bivalent Booster 28yrs & up 03/16/2021   Pneumococcal Conjugate-13 02/25/2014   Pneumococcal Polysaccharide-23 08/15/2011, 12/19/2016   Pneumococcal-Unspecified 03/08/2017   Respiratory Syncytial Virus Vaccine,Recomb  Aduvanted(Arexvy) 06/06/2022   Td 02/21/2010   Tdap 11/05/2017   Zoster, Live 01/29/2012    Family History  Problem Relation Age of Onset   Heart disease Mother        CABG in her 30s, nonsmoker   Cancer Mother    Stroke Father    Heart disease Father        Died of MI at age 22, smoker   Hepatitis Sister    Coronary artery disease Other        male 1st degree relative <60   Colon cancer Neg Hx    Pancreatic cancer Neg Hx    Esophageal cancer Neg Hx    Inflammatory bowel disease Neg Hx    Liver disease Neg Hx    Rectal cancer Neg Hx  Stomach cancer Neg Hx      Current Outpatient Medications:    acetaminophen  (TYLENOL ) 500 MG tablet, Take 500 mg by mouth every 4 (four) hours as needed for moderate pain, fever, headache or mild pain., Disp: , Rfl:    albuterol  (VENTOLIN  HFA) 108 (90 Base) MCG/ACT inhaler, Inhale 2 puffs into the lungs every 6 (six) hours as needed for wheezing or shortness of breath., Disp: , Rfl:    azelastine (ASTELIN) 0.1 % nasal spray, Place 2 sprays into both nostrils 2 (two) times daily. Use in each nostril as directed, Disp: 54 mL, Rfl: 6   azithromycin  (ZITHROMAX ) 250 MG tablet, TAKE 1 TABLET BY MOUTH 3 TIMES  WEEKLY, Disp: 39 tablet, Rfl: 3   Calcium  Carb-Cholecalciferol  (CALCIUM  + D3 PO), Take 1 tablet by mouth daily with breakfast., Disp: , Rfl:    docusate sodium  (COLACE) 100 MG capsule, Take 2 capsules (200 mg total) by mouth 2 (two) times daily., Disp: 10 capsule, Rfl: 0   ELIQUIS  2.5 MG TABS tablet, TAKE 1 TABLET BY MOUTH TWICE  DAILY, Disp: 180 tablet, Rfl: 3   Ensifentrine  (OHTUVAYRE ) 3 MG/2.5ML SUSP, INHALE THE CONTENTS OF 1 AMPULE VIA NEBULIZER IN THE MORNING AND IN THE EVENING, Disp: 450 mL, Rfl: 1   Ferrous Sulfate  (IRON ) 325 (65 Fe) MG TABS, Take 325 mg by mouth See admin instructions. Take 325 mg by mouth with food every other day, Disp: , Rfl:    fexofenadine (ALLEGRA) 180 MG tablet, Take 180 mg by mouth daily., Disp: , Rfl:     fluticasone  (FLONASE ) 50 MCG/ACT nasal spray, USE 2 SPRAYS IN BOTH NOSTRILS  DAILY, Disp: 48 g, Rfl: 3   folic acid  (FOLVITE ) 1 MG tablet, Take 1 tablet (1 mg total) by mouth daily., Disp: , Rfl:    furosemide  (LASIX ) 20 MG tablet, TAKE 1 TABLET BY MOUTH DAILY AS  NEEDED FOR FLUID OR EDEMA, Disp: 90 tablet, Rfl: 3   guaiFENesin  (MUCINEX  PO), Take 1,200 mg by mouth daily., Disp: , Rfl:    ipratropium-albuterol  (DUONEB) 0.5-2.5 (3) MG/3ML SOLN, TAKE 3 MLS BY NEBULIZATION 3 (THREE) TIMES DAILY., Disp: 810 mL, Rfl: 0   Multiple Vitamin (MULTIVITAMIN WITH MINERALS) TABS tablet, Take 1 tablet by mouth daily., Disp: , Rfl:    mupirocin  ointment (BACTROBAN ) 2 %, APPLY TOPICALLY TWICE DAILY FOR  SKIN INFECTIONS, Disp: 60 g, Rfl: 3   OXYGEN, Inhale 5.5 L/min into the lungs continuous., Disp: , Rfl:    pantoprazole  (PROTONIX ) 20 MG tablet, TAKE 1 TABLET BY MOUTH DAILY, Disp: 90 tablet, Rfl: 3   predniSONE  (DELTASONE ) 10 MG tablet, Take 1 tablet (10 mg total) by mouth daily with breakfast., Disp: 90 tablet, Rfl: 3   rosuvastatin  (CRESTOR ) 40 MG tablet, Take 1 tablet (40 mg total) by mouth daily., Disp: 90 tablet, Rfl: 3   SPIRIVA  RESPIMAT 2.5 MCG/ACT AERS, USE 2 INHALATIONS BY MOUTH ONCE  DAILY, Disp: 12 g, Rfl: 3   SYMBICORT  160-4.5 MCG/ACT inhaler, INHALE 2 INHALATIONS BY MOUTH  TWICE DAILY, Disp: 30.6 g, Rfl: 3   SYSTANE PRESERVATIVE FREE 0.4-0.3 % SOLN, Place 1 drop into both eyes 2 (two) times daily., Disp: , Rfl:    traMADol  (ULTRAM ) 50 MG tablet, Take 50 mg by mouth every 12 (twelve) hours as needed for severe pain., Disp: , Rfl:    doxycycline  (VIBRA -TABS) 100 MG tablet, Take 1 tablet (100 mg total) by mouth 2 (two) times daily. (Patient not taking: Reported on 04/12/2024), Disp: 14 tablet,  Rfl: 0   predniSONE  (DELTASONE ) 10 MG tablet, 4 x 3 days, 3 x 3 days, 2 x 3 days, 1 x 3 days then stop (Patient not taking: Reported on 04/12/2024), Disp: 30 tablet, Rfl: 0   Latest Reference Range & Units 10/31/08  13:57 02/12/10 08:32 01/31/11 08:26 10/23/11 09:34 01/15/12 08:08 02/09/13 09:14 02/25/14 08:55 06/05/16 08:23 12/06/16 07:54 06/25/17 08:19 12/25/17 08:50 04/03/18 11:48 04/07/18 11:46 06/02/19 11:05 11/29/19 09:26 02/18/20 16:45 03/10/20 11:07 07/06/20 09:41 08/23/20 12:57 10/04/20 14:53 12/07/20 08:48 12/10/21 08:49 03/14/22 09:59 06/06/22 10:06 06/21/22 14:32 08/09/22 13:30 08/11/22 05:31 08/12/22 02:26 08/13/22 05:22 08/14/22 06:17 08/15/22 02:37 12/24/22 10:06 02/20/23 16:17 02/22/23 01:54 03/03/23 16:24 07/12/23 04:25 07/13/23 04:44 07/14/23 04:40 08/21/23 14:40 01/13/24 10:03 01/18/24 13:48 02/09/24 13:25 03/04/24 15:27 03/06/24 08:59 03/08/24 04:09  Eosinophils Absolute 0.0 - 0.5 K/uL 0.1 0.2 0.2 0.0 0.2 0.1 0.3 0.1 0.2 0.2 0.0 0.0 0.0 0.0 0.2 0.0 53 157 0.0 0.0 0.2 0.2 0.1 0.1 0.1 0.0 0.3 0.2 0.4 0.4 0.0 0.2 0.0 0.0 0.1 0.0 0.0 0.0 0.1 0.0 0.0 0.0 0.2 0.0 0.0      Objective:   Vitals:   04/12/24 1135  Weight: 164 lb (74.4 kg)  Height: 6' (1.829 m)    Estimated body mass index is 22.24 kg/m as calculated from the following:   Height as of this encounter: 6' (1.829 m).   Weight as of this encounter: 164 lb (74.4 kg).  @WEIGHTCHANGE @  [employer]   04/12/24 1135  Weight: 164 lb (74.4 kg)     Physical Exam   General: No distress. Looks the same O2 at rest: yes [place] present: no Sitting in wheel chair: no Frail: yes Obese: no Neuro: Alert and Oriented x 3. GCS 15. Speech normal Psych: Pleasant Resp:  On o2 with purse lip breaing        Assessment/     Assessment & Plan Chronic respiratory failure with hypoxia and hypercapnia (HCC)  Chronic sinusitis, unspecified location  Goals of care, counseling/discussion    PLAN Patient Instructions     ICD-10-CM   1. Chronic respiratory failure with hypoxia and hypercapnia (HCC)  J96.11    J96.12     2. Chronic sinusitis, unspecified location  J32.9     3. Goals of care, counseling/discussion  Z71.89         Ongoing or recurrent flareups without respect.  Currently more stable especially after starting night BiPAP Ongoing sinus drainage issues despite Flonase  and Allegra   Recommendations - Add Astelin nasal inhaler [recent [place] study showing benefit in preventing respiratory infections] - - Continue Ohtuvayre  nebulizer morning and evening  - - Continue -5.5L oxygen to maintain O2 >88-90%  - Continue noninvasive positive pressure ventilation at night-via ventilator with seems to be working better for you - Continue other treatment for COPD etc. -You do not need PFTs anymore  Follow-up: 3 months video visit with nurse practitioner 6 months video visit Dr. Geronimo BOSCH    Return for 3 months with nurse practitioner in 6 months with Dr. Geronimo; both video visits.    SIGNATURE    Dr. Dorethia Francisco, M.D., F.C.C.P,  Pulmonary and Critical Care Medicine Staff Physician, [facility] Director - Interstitial Lung Disease  Program  Pulmonary Fibrosis [facility] at [facility] [place], [place], [address]  Pager: [phone], If no answer or between  15:00h - 7:00h: call [phone] Telephone: [phone]  12:12 PM 04/12/2024

## 2024-04-13 ENCOUNTER — Ambulatory Visit: Admitting: Internal Medicine

## 2024-04-22 ENCOUNTER — Other Ambulatory Visit: Payer: Self-pay | Admitting: Family Medicine

## 2024-04-28 ENCOUNTER — Encounter: Payer: Self-pay | Admitting: Family Medicine

## 2024-05-08 ENCOUNTER — Encounter: Payer: Self-pay | Admitting: Family Medicine

## 2024-05-10 NOTE — Telephone Encounter (Signed)
 LVM to schedule ov with Katrinka this week

## 2024-05-11 ENCOUNTER — Telehealth: Payer: Self-pay | Admitting: *Deleted

## 2024-05-11 ENCOUNTER — Encounter: Payer: Self-pay | Admitting: Family Medicine

## 2024-05-11 ENCOUNTER — Ambulatory Visit (INDEPENDENT_AMBULATORY_CARE_PROVIDER_SITE_OTHER): Admitting: Family Medicine

## 2024-05-11 VITALS — BP 98/62 | HR 112 | Temp 98.4°F | Ht 72.0 in

## 2024-05-11 DIAGNOSIS — M7021 Olecranon bursitis, right elbow: Secondary | ICD-10-CM

## 2024-05-11 MED ORDER — DOXYCYCLINE HYCLATE 100 MG PO TABS: 100.0000 mg | ORAL_TABLET | Freq: Two times a day (BID) | ORAL | 0 refills | Status: AC

## 2024-05-11 MED ORDER — CEPHALEXIN 500 MG PO CAPS: 500.0000 mg | ORAL_CAPSULE | Freq: Three times a day (TID) | ORAL | 0 refills | Status: AC

## 2024-05-11 NOTE — Progress Notes (Signed)
 Phone [phone] In person visit   Subjective:   Francisco [name] is a 78 y.o. year old very pleasant male patient who presents for/with See problem oriented charting Chief Complaint  Patient presents with   Right Arm Problems    Patient has a yellow discharge coming from his right arm for 3 weeks;     Past Medical History-  Patient Active Problem List   Diagnosis Date Noted   Lumbar compression fracture (HCC) 09/11/2022    Priority: High   Chronic systolic congestive heart failure (HCC) 12/10/2021    Priority: High   Malignant neoplasm of lower lobe of right lung [facility])     Priority: High   Peripheral artery disease 12/11/2018    Priority: High   End stage COPD (HCC) 11/27/2018    Priority: High   DNR (do not resuscitate) 04/21/2018    Priority: High   Chronic pulmonary embolism (HCC) 04/07/2018    Priority: High   Osteoporosis 11/19/2017    Priority: High   Thoracic compression fracture (HCC) 11/02/2017    Priority: High   Former smoker 08/23/2008    Priority: High   Vitamin D  deficiency 03/03/2023    Priority: Medium    Syncope and collapse 02/20/2023    Priority: Medium    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    Priority: Medium    History of upper GI bleeding 04/30/2020    Priority: Medium    Essential tremor 03/31/2018    Priority: Medium    BPPV (benign paroxysmal positional vertigo), right 02/10/2018    Priority: Medium    Essential hypertension 11/02/2017    Priority: Medium    BPH associated with nocturia 06/25/2017    Priority: Medium    Hyperlipidemia 07/22/2014    Priority: Medium    Pre-operative respiratory examination 12/10/2018    Priority: Low   Venous stasis dermatitis of both lower extremities 11/02/2017    Priority: Low   Chronic hypoxic respiratory failure (HCC) 09/23/2017    Priority: Low   History of skin cancer 05/17/2014    Priority: Low   Chronic rhinitis 10/28/2012    Priority: Low   Pulmonary nodule 09/23/2011     Priority: Low   History of colonic polyps 08/23/2008    Priority: Low   Cellulitis of left hand 03/04/2024   History of lung cancer 03/04/2024   Hx of sinus tachycardia 03/04/2024   History of pulmonary embolism 03/04/2024   Acute on chronic respiratory failure with hypoxia and hypercapnia (HCC) 07/11/2023   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   Skin tear of elbow without complication, left, initial encounter 07/10/2023   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     albuterol  (VENTOLIN  HFA) 108 (90 Base) MCG/ACT inhaler Inhale 2 puffs into the lungs every 6 (six) hours as needed for wheezing or shortness of breath.     azelastine (ASTELIN) 0.1 % nasal spray Place 2 sprays into both nostrils 2 (two) times daily. Use in each nostril as directed 54 mL 6  azithromycin  (ZITHROMAX ) 250 MG tablet TAKE 1 TABLET BY MOUTH 3 TIMES  WEEKLY 39 tablet 3   Calcium  Carb-Cholecalciferol  (CALCIUM  + D3 PO) Take 1 tablet by mouth daily with breakfast.     cephALEXin  (KEFLEX ) 500 MG capsule Take 1 capsule (500 mg total) by mouth 3 (three) times daily for 7 days. 21 capsule 0   docusate sodium  (COLACE) 100 MG capsule Take 2 capsules (200 mg total) by mouth 2 (two) times daily. 10 capsule 0   ELIQUIS  2.5 MG TABS tablet TAKE 1 TABLET BY MOUTH TWICE  DAILY 180 tablet 3   Ensifentrine  (OHTUVAYRE ) 3 MG/2.5ML SUSP INHALE THE CONTENTS OF 1 AMPULE VIA NEBULIZER IN THE MORNING AND IN THE EVENING 450 mL 1    Ferrous Sulfate  (IRON ) 325 (65 Fe) MG TABS Take 325 mg by mouth See admin instructions. Take 325 mg by mouth with food every other day     fexofenadine (ALLEGRA) 180 MG tablet Take 180 mg by mouth daily.     fluticasone  (FLONASE ) 50 MCG/ACT nasal spray USE 2 SPRAYS IN BOTH NOSTRILS  DAILY 48 g 3   folic acid  (FOLVITE ) 1 MG tablet Take 1 tablet (1 mg total) by mouth daily.     furosemide  (LASIX ) 20 MG tablet TAKE 1 TABLET BY MOUTH DAILY AS  NEEDED FOR FLUID OR EDEMA 90 tablet 3   guaiFENesin  (MUCINEX  PO) Take 1,200 mg by mouth daily.     ipratropium-albuterol  (DUONEB) 0.5-2.5 (3) MG/3ML SOLN TAKE 3 MLS BY NEBULIZATION 3 (THREE) TIMES DAILY. 810 mL 0   Multiple Vitamin (MULTIVITAMIN WITH MINERALS) TABS tablet Take 1 tablet by mouth daily.     mupirocin  ointment (BACTROBAN ) 2 % APPLY TOPICALLY TWICE DAILY FOR  SKIN INFECTIONS 60 g 3   OXYGEN Inhale 5.5 L/min into the lungs continuous.     pantoprazole  (PROTONIX ) 20 MG tablet TAKE 1 TABLET BY MOUTH DAILY 90 tablet 3   rosuvastatin  (CRESTOR ) 40 MG tablet Take 1 tablet (40 mg total) by mouth daily. 90 tablet 3   SPIRIVA  RESPIMAT 2.5 MCG/ACT AERS USE 2 INHALATIONS BY MOUTH ONCE  DAILY 12 g 3   SYMBICORT  160-4.5 MCG/ACT inhaler INHALE 2 INHALATIONS BY MOUTH  TWICE DAILY 30.6 g 3   SYSTANE PRESERVATIVE FREE 0.4-0.3 % SOLN Place 1 drop into both eyes 2 (two) times daily.     traMADol  (ULTRAM ) 50 MG tablet Take 50 mg by mouth every 12 (twelve) hours as needed for severe pain.     doxycycline  (VIBRA -TABS) 100 MG tablet Take 1 tablet (100 mg total) by mouth 2 (two) times daily. 14 tablet 0   No current facility-administered medications for this visit.     Objective:  BP 98/62 (BP Location: Left Arm, Patient Position: Sitting, Cuff Size: Normal)   Pulse (!) 112   Temp 98.4 F (36.9 C) (Temporal)   Ht 6' (1.829 m)   SpO2 97%   BMI 22.24 kg/m  Gen: NAD, resting comfortably CV: RRR no rubs or gallops Lungs: Diffuse rhonchi noted Ext: 1+ edema,  right olecranon bursa enlarged about 5 x 5 cm-no obvious fluctuant area-rather indurated but warm and tender and erythematous Skin: warm, dry     Assessment and Plan   # Right arm drainage S: The last 3 weeks patient has noted yellow drainage coming from his right arm currently has noted gradually worsening of volume of drainage. Some tenderness. Some warmth but tenderness and warmth not worsening- mainly drainage increase was main concern  -No fever,  chills, expanding redness.  He does have history of significant cellulitis of the left hand in the past 6 months but this is the opposite arm. Did have some skin cancer removals on the right arm recently but not directly near that area  -For edema for his legs he is on Lasix  20 mg as needed for daily use (takes daily and sometimes twice daily)with known CHF -On recent antibiotics he has been on his September with doxycycline  as well as ongoing azithromycin   A/P: Significant right-sided bivalent bursitis.  Warm, tender, erythematous.  Serosanguineous drainage noted in the report worsening of this portion-no reports of systemic illness.  We discussed direct referral to orthopedics with concern for septic bursitis/acute risk but with 3 weeks of this already and ongly gardually worsening and his desire to minimize medical interventions discussed trial of doxycycline  plus amoxicillin  but unfortunately he has allergy /intolerance to amoxicillin  but tolerates Keflex  we opted to go with this instead.  If not making significant improvement within a week with conservative measures as per AVS including compression sleeve he agrees to referral to orthopedic surgery likely in [place] area -Already on prednisone  and we opted not to increase this -Have to avoid NSAIDs with chronic Eliquis  use -No history of gout -Does chronically use his elbows to help him lift up and I think trauma is the most likely trigger here no recent skin procedure left arm may increase risk  of bacterial infection slightly   Recommended follow up: Return for as needed for new, worsening, persistent symptoms. Future Appointments  Date Time Provider Department Center  05/18/2024  2:30 PM Heilingoetter, Cassandra L, PA-C [facility] None  06/08/2024  1:30 PM [facility] 1 [facility] [place]  06/21/2024  1:30 PM Lanell Donald Stagger, PA-C [facility] None    Lab/Order associations:   ICD-10-CM   1. Olecranon bursitis of right elbow  M70.21       Meds ordered this encounter  Medications   doxycycline  (VIBRA -TABS) 100 MG tablet    Sig: Take 1 tablet (100 mg total) by mouth 2 (two) times daily.    Dispense:  14 tablet    Refill:  0   cephALEXin  (KEFLEX ) 500 MG capsule    Sig: Take 1 capsule (500 mg total) by mouth 3 (three) times daily for 7 days.    Dispense:  21 capsule    Refill:  0   I personally spent a total of 41 minutes in the care of the patient today including preparing to see the patient, getting/reviewing separately obtained history, performing a medically appropriate exam/evaluation, counseling and educating, placing orders, and documenting clinical information in the EHR.  In particular we discussed his desire to minimize medical interventions including needle aspiration or surgery and desire to trial antibiotics for other evaluations at present but also discussing potential need for these still.   Return precautions advised.  Garnette Lukes, MD

## 2024-05-11 NOTE — Telephone Encounter (Signed)
 Spoke with spouse informed her new appt. for follow up is 05/18/24 at 1430. Last CT scan 7/25 and Per Cassie, PA will review and order labs and scan as needed after follow up appt.Francisco [name]

## 2024-05-11 NOTE — Patient Instructions (Addendum)
 Start 2 antibiotic(s) to try to settle this down- try ice for temporary comfort and compression sleeve and try to avoid trauma to that area if possible- hoping less warmth, less tenderness, less drainage within a week and if not- or not continuing to gradually improve after that refer to orthopedic surgery   Recommended follow up: Return for as needed for new, worsening, persistent symptoms.

## 2024-05-15 NOTE — Progress Notes (Deleted)
 [facility] OFFICE PROGRESS NOTE  [name], MD [address] [place] [address]  DIAGNOSIS:  limited stage (T1a, N0, M0) small cell lung cancer presented with 0.9 cm superior segment right lower lobe pulmonary nodule diagnosed in February 2023.   PRIOR THERAPY: SBRT to the right lower lobe lung nodule under the care of Dr. Dewey   CURRENT THERAPY: ***  INTERVAL HISTORY: Francisco [name] Francisco [name] 78 y.o. male returns to clinic today for follow-up visit.  The patient was last seen in the clinic by Dr. Sherrod in January 2025.  The patient has a history of limited stage small cell lung cancer for which the patient underwent SBRT without chemotherapy.  He was lost to follow up since his last appointment. He had a CT in July but no follow up.  In the interval since last being seen the patient has ***  He was hospitalized for COPD exacerbation in August 2025. He sees Dr. Geronimo from pulmonary medicine. His next brain MRI is scheduled fro 06/08/24.   Today he denies any fever, chills, night sweats, or unexplained weight loss.  He denies any new chest pain or Hemoptysis.  He wears 4 L of supplemental oxygen at baseline.  His shortness of breath is***.  Cough?  He denies any nausea, vomiting, diarrhea, or constipation.  His next brain MRI is scheduled for ***.  He is here today for evaluation and to review his recent scan.  MEDICAL HISTORY: Past Medical History:  Diagnosis Date   Acute GI bleeding 02/18/2020   Melena with hemoglobin down to 10 February 2020.  EGD with intervention-follow-up with Dr. Wilhelmenia     Allergy     Blood transfusion without reported diagnosis    CHF (congestive heart failure) (HCC) 2024   [facility] rehab told us    Chronic back pain    mid and lower (04/07/2018)   Chronic rhinitis    -Sinus Ct 08/01/2009 >> Bilateral maxillary sinusitis with some mucosal thickeningin the sphenoid and frontal sinuses as well with air fluid levels present  -chronic rhinitis flyer Aug 04, 2009   Clotting disorder 2022@2024    Internal bleed   Compressed spine fracture (HCC)    COPD (chronic obstructive pulmonary disease) (HCC)    PFT's rec Jul 17, 2009   Dyspnea    Emphysema lung (HCC)    Emphysema of lung (HCC)    GERD (gastroesophageal reflux disease)    Hyperglycemia 06/12/2016   Hypertension 2/24   Questionable   Iron  deficiency anemia due to chronic blood loss 08/23/2020   Left ankle swelling 07/22/2014   HTN- controlled when fluid status contolled. on triamterene -hctz 37.5-25mg  sparingly  Unclear etiology.   LLE U/S 2016:     Leukocytosis 12/19/2016   Melena 08/11/2022   On home oxygen therapy    3L; 24/7 (04/07/2018)   Onychomycosis    Dr. Naomi   Orthostatic hypotension    since 10/2017 (04/07/2018)   Osteoporosis    Oxygen deficiency    PAD (peripheral artery disease)    Pneumonia    twice in 1 year (04/07/2018)   Pulmonary embolism (HCC) 04/07/2018   Skin cancer    lips, face, ears, arms (04/07/2018)   Small cell lung cancer, right (HCC) 2023   Dr. Dewey.  rad   Vertigo    since ~ 02/2018 (04/07/2018)    ALLERGIES:  is allergic to tape, augmentin  [amoxicillin -pot clavulanate], amoxicillin , bactrim  [sulfamethoxazole -trimethoprim ], clavulanic acid, gadolinium derivatives, ciprofloxacin , gadavist  [gadobutrol ], gadolinium, and  lipitor [atorvastatin ].  MEDICATIONS:  Current Outpatient Medications  Medication Sig Dispense Refill   acetaminophen  (TYLENOL ) 500 MG tablet Take 500 mg by mouth every 4 (four) hours as needed for moderate pain, fever, headache or mild pain.     albuterol  (VENTOLIN  HFA) 108 (90 Base) MCG/ACT inhaler Inhale 2 puffs into the lungs every 6 (six) hours as needed for wheezing or shortness of breath.     azelastine (ASTELIN) 0.1 % nasal spray Place 2 sprays into both nostrils 2 (two) times daily. Use in each nostril as directed 54 mL 6   azithromycin  (ZITHROMAX ) 250 MG tablet TAKE 1 TABLET BY MOUTH  3 TIMES  WEEKLY 39 tablet 3   Calcium  Carb-Cholecalciferol  (CALCIUM  + D3 PO) Take 1 tablet by mouth daily with breakfast.     cephALEXin  (KEFLEX ) 500 MG capsule Take 1 capsule (500 mg total) by mouth 3 (three) times daily for 7 days. 21 capsule 0   docusate sodium  (COLACE) 100 MG capsule Take 2 capsules (200 mg total) by mouth 2 (two) times daily. 10 capsule 0   doxycycline  (VIBRA -TABS) 100 MG tablet Take 1 tablet (100 mg total) by mouth 2 (two) times daily. 14 tablet 0   ELIQUIS  2.5 MG TABS tablet TAKE 1 TABLET BY MOUTH TWICE  DAILY 180 tablet 3   Ensifentrine  (OHTUVAYRE ) 3 MG/2.5ML SUSP INHALE THE CONTENTS OF 1 AMPULE VIA NEBULIZER IN THE MORNING AND IN THE EVENING 450 mL 1   Ferrous Sulfate  (IRON ) 325 (65 Fe) MG TABS Take 325 mg by mouth See admin instructions. Take 325 mg by mouth with food every other day     fexofenadine (ALLEGRA) 180 MG tablet Take 180 mg by mouth daily.     fluticasone  (FLONASE ) 50 MCG/ACT nasal spray USE 2 SPRAYS IN BOTH NOSTRILS  DAILY 48 g 3   folic acid  (FOLVITE ) 1 MG tablet Take 1 tablet (1 mg total) by mouth daily.     furosemide  (LASIX ) 20 MG tablet TAKE 1 TABLET BY MOUTH DAILY AS  NEEDED FOR FLUID OR EDEMA 90 tablet 3   guaiFENesin  (MUCINEX  PO) Take 1,200 mg by mouth daily.     ipratropium-albuterol  (DUONEB) 0.5-2.5 (3) MG/3ML SOLN TAKE 3 MLS BY NEBULIZATION 3 (THREE) TIMES DAILY. 810 mL 0   Multiple Vitamin (MULTIVITAMIN WITH MINERALS) TABS tablet Take 1 tablet by mouth daily.     mupirocin  ointment (BACTROBAN ) 2 % APPLY TOPICALLY TWICE DAILY FOR  SKIN INFECTIONS 60 g 3   OXYGEN Inhale 5.5 L/min into the lungs continuous.     pantoprazole  (PROTONIX ) 20 MG tablet TAKE 1 TABLET BY MOUTH DAILY 90 tablet 3   rosuvastatin  (CRESTOR ) 40 MG tablet Take 1 tablet (40 mg total) by mouth daily. 90 tablet 3   SPIRIVA  RESPIMAT 2.5 MCG/ACT AERS USE 2 INHALATIONS BY MOUTH ONCE  DAILY 12 g 3   SYMBICORT  160-4.5 MCG/ACT inhaler INHALE 2 INHALATIONS BY MOUTH  TWICE DAILY 30.6 g 3    SYSTANE PRESERVATIVE FREE 0.4-0.3 % SOLN Place 1 drop into both eyes 2 (two) times daily.     traMADol  (ULTRAM ) 50 MG tablet Take 50 mg by mouth every 12 (twelve) hours as needed for severe pain.     No current facility-administered medications for this visit.    SURGICAL HISTORY:  Past Surgical History:  Procedure Laterality Date   ABDOMINAL AORTOGRAM W/LOWER EXTREMITY N/A 01/21/2020   Procedure: ABDOMINAL AORTOGRAM W/LOWER EXTREMITY;  Surgeon: Harvey Carlin BRAVO, MD;  [facility]: [facility];  Service:  Cardiovascular;  Laterality: N/A;   APPENDECTOMY  1998   BIOPSY  02/20/2020   Procedure: BIOPSY;  Surgeon: Wilhelmenia Aloha Raddle., MD;  Location: [facility] ENDOSCOPY;  Service: Gastroenterology;;   BIOPSY  08/12/2022   Procedure: BIOPSY;  Surgeon: San Sandor GAILS, DO;  Location: [facility] ENDOSCOPY;  Service: Gastroenterology;;   BRONCHIAL BIOPSY  09/04/2021   Procedure: BRONCHIAL BIOPSIES;  Surgeon: Brenna Adine CROME, DO;  Location: [facility] ENDOSCOPY;  Service: Pulmonary;;   BRONCHIAL BRUSHINGS  09/04/2021   Procedure: BRONCHIAL BRUSHINGS;  Surgeon: Brenna Adine CROME, DO;  Location: [facility] ENDOSCOPY;  Service: Pulmonary;;   BRONCHIAL NEEDLE ASPIRATION BIOPSY  09/04/2021   Procedure: BRONCHIAL NEEDLE ASPIRATION BIOPSIES;  Surgeon: Brenna Adine CROME, DO;  Location: [facility] ENDOSCOPY;  Service: Pulmonary;;   CATARACT EXTRACTION, BILATERAL     CHOLECYSTECTOMY     ENDARTERECTOMY FEMORAL Right 04/19/2019   Procedure: ENDARTERECTOMY RIGHT COMMON FEMORAL;  Surgeon: Harvey Carlin BRAVO, MD;  Location: [facility] OR;  Service: Vascular;  Laterality: Right;   ENDARTERECTOMY FEMORAL Left 01/24/2020   Procedure: LEFT FEMORAL ENDARTERECTOMY WITH DACRON PATCH ANGIOPLASTY;  Surgeon: Harvey Carlin BRAVO, MD;  Location: [facility] OR;  Service: Vascular;  Laterality: Left;   ESOPHAGOGASTRODUODENOSCOPY (EGD) WITH PROPOFOL  N/A 02/20/2020   Procedure: ESOPHAGOGASTRODUODENOSCOPY (EGD) WITH PROPOFOL ;  Surgeon: Wilhelmenia Aloha Raddle., MD;   Location: [facility] ENDOSCOPY;  Service: Gastroenterology;  Laterality: N/A;   ESOPHAGOGASTRODUODENOSCOPY (EGD) WITH PROPOFOL  N/A 08/12/2022   Procedure: ESOPHAGOGASTRODUODENOSCOPY (EGD) WITH PROPOFOL ;  Surgeon: San Sandor GAILS, DO;  Location: [facility] ENDOS[facility];  Service: Gastroenterology;  Laterality: N/A;   EYE SURGERY  2019   Cataract   FEMORAL ENDARTERECTOMY Left 01/24/2020   FIDUCIAL MARKER PLACEMENT  09/04/2021   Procedure: FIDUCIAL MARKER PLACEMENT;  Surgeon: Brenna Adine CROME, DO;  Location: [facility] ENDOS[facility];  Service: Pulmonary;;   FRACTURE SURGERY  2019   HEMOSTASIS CLIP PLACEMENT  02/20/2020   Procedure: HEMOSTASIS CLIP PLACEMENT;  Surgeon: Wilhelmenia Aloha Raddle., MD;  Location: [facility] ENDOSCOPY;  Service: Gastroenterology;;   HEMOSTASIS CLIP PLACEMENT  08/12/2022   Procedure: HEMOSTASIS CLIP PLACEMENT;  Surgeon: San Sandor GAILS, DO;  Location: [facility]OS[facility];  Service: Gastroenterology;;   HOT HEMOSTASIS N/A 02/20/2020   Procedure: HOT HEMOSTASIS (ARGON PLASMA COAGULATION/BICAP);  Surgeon: Wilhelmenia Aloha Raddle., MD;  Location: [facility] ENDOSCOPY;  Service: Gastroenterology;  Laterality: N/A;   HOT HEMOSTASIS N/A 08/12/2022   Procedure: HOT HEMOSTASIS (ARGON PLASMA COAGULATION/BICAP);  Surgeon: San Sandor GAILS, DO;  Location: [facility] ENDOSCOPY;  Service: Gastroenterology;  Laterality: N/A;   INSERTION OF ILIAC STENT Left 01/24/2020   Procedure: INSERTION OF VBX STENT 8X59 AND 8X39 LEFT COMMON ILIAC ARTERY. INSERTION OF INNOVA 7 X 60 INNOVA STENT LEFT EXTERNAL ILIAC ARTERY. ;  Surgeon: Harvey Carlin BRAVO, MD;  Location: [facility] OR;  Service: Vascular;  Laterality: Left;   LOWER EXTREMITY ANGIOGRAPHY  12/11/2018   LOWER EXTREMITY ANGIOGRAPHY N/A 12/11/2018   Procedure: LOWER EXTREMITY ANGIOGRAPHY;  Surgeon: Harvey Carlin BRAVO, MD;  [facility];  Service: Cardiovascular;  Laterality: N/A;   PATCH ANGIOPLASTY Right 04/19/2019   Procedure: Patch Angioplasty;  Surgeon: Harvey Carlin BRAVO, MD;   Location: [facility] OR;  Service: Vascular;  Laterality: Right;   PERIPHERAL VASCULAR INTERVENTION Right 12/11/2018   Procedure: PERIPHERAL VASCULAR INTERVENTION;  Surgeon: Harvey Carlin BRAVO, MD;  [facility];  Service: Cardiovascular;  Laterality: Right;  Common Iliac    SKIN CANCER EXCISION     lips, face, ears, arms (04/07/2018)   SUBMUCOSAL INJECTION  08/12/2022   Procedure: SUBMUCOSAL  INJECTION;  Surgeon: San Sandor GAILS, DO;  Location: [facility] ENDOS[facility];  Service: Gastroenterology;;   TONSILLECTOMY     ULTRASOUND GUIDANCE FOR VASCULAR ACCESS Right 01/24/2020   Procedure: ULTRASOUND GUIDANCE FOR VASCULAR ACCESS;  Surgeon: Harvey Carlin BRAVO, MD;  Location[facility] OR;  Service: Vascular;  Laterality: Right;   VIDEO BRONCHOSCOPY WITH RADIAL ENDOBRONCHIAL ULTRASOUND  09/04/2021   Procedure: VIDEO BRONCHOSCOPY WITH RADIAL ENDOBRONCHIAL ULTRASOUND;  Surgeon: Brenna Adine CROME, DO;  Location: [facility] ENDOSCOPY;  Service: Pulmonary;;    REVIEW OF SYSTEMS:   Review of Systems  Constitutional: Negative for appetite change, chills, fatigue, fever and unexpected weight change.  HENT:   Negative for mouth sores, nosebleeds, sore throat and trouble swallowing.   Eyes: Negative for eye problems and icterus.  Respiratory: Negative for cough, hemoptysis, shortness of breath and wheezing.   Cardiovascular: Negative for chest pain and leg swelling.  Gastrointestinal: Negative for abdominal pain, constipation, diarrhea, nausea and vomiting.  Genitourinary: Negative for bladder incontinence, difficulty urinating, dysuria, frequency and hematuria.   Musculoskeletal: Negative for back pain, gait problem, neck pain and neck stiffness.  Skin: Negative for itching and rash.  Neurological: Negative for dizziness, extremity weakness, gait problem, headaches, light-headedness and seizures.  Hematological: Negative for adenopathy. Does not bruise/bleed easily.  Psychiatric/Behavioral: Negative for confusion,  depression and sleep disturbance. The patient is not nervous/anxious.     PHYSICAL EXAMINATION:  There were no vitals taken for this visit.  ECOG PERFORMANCE STATUS: {CHL ONC ECOG [id number]  Physical Exam  Constitutional: Oriented to person, place, and time and well-developed, well-nourished, and in no distress. No distress.  HENT:  Head: Normocephalic and atraumatic.  Mouth/Throat: Oropharynx is clear and moist. No oropharyngeal exudate.  Eyes: Conjunctivae are normal. Right eye exhibits no discharge. Left eye exhibits no discharge. No scleral icterus.  Neck: Normal range of motion. Neck supple.  Cardiovascular: Normal rate, regular rhythm, normal heart sounds and intact distal pulses.   Pulmonary/Chest: Effort normal and breath sounds normal. No respiratory distress. No wheezes. No rales.  Abdominal: Soft. Bowel sounds are normal. Exhibits no distension and no mass. There is no tenderness.  Musculoskeletal: Normal range of motion. Exhibits no edema.  Lymphadenopathy:    No cervical adenopathy.  Neurological: Alert and oriented to person, place, and time. Exhibits normal muscle tone. Gait normal. Coordination normal.  Skin: Skin is warm and dry. No rash noted. Not diaphoretic. No erythema. No pallor.  Psychiatric: Mood, memory and judgment normal.  Vitals reviewed.  LABORATORY DATA: Lab Results  Component Value Date   WBC 7.8 03/08/2024   HGB 9.3 (L) 03/08/2024   HCT 30.6 (L) 03/08/2024   MCV 102.3 (H) 03/08/2024   PLT 147 (L) 03/08/2024      Chemistry      Component Value Date/Time   NA 142 03/08/2024 0409   NA 142 09/03/2022 1352   K 4.4 03/08/2024 0409   CL 97 (L) 03/08/2024 0409   CO2 35 (H) 03/08/2024 0409   BUN 20 03/08/2024 0409   BUN 13 09/03/2022 1352   CREATININE 0.68 03/08/2024 0409   CREATININE 0.97 01/13/2024 1003   CREATININE 1.14 07/06/2020 0941   GLU 90 03/02/2020 0000      Component Value Date/Time   CALCIUM  8.4 (L) 03/08/2024 0409   ALKPHOS  67 03/05/2024 0407   AST 24 03/05/2024 0407   AST 25 01/13/2024 1003   ALT 12 03/05/2024 0407   ALT 15 01/13/2024 1003   BILITOT 0.2 03/05/2024 0407  BILITOT 0.8 01/13/2024 1003       RADIOGRAPHIC STUDIES:  No results found.   ASSESSMENT/PLAN:  This is a very pleasant 78 years old Caucasian male diagnosed with limited stage (T1 a, N0, M0) small cell lung cancer presented with 0.9 cm superior segment right lower lobe pulmonary nodule diagnosed in February 2023 status post SBRT to the right lower lobe lung nodule.     The patient has been on observation and he is feeling fine with no concerning complaints except for the baseline shortness of breath secondary to COPD and he is currently on home oxygen.   The patient had CT scan of the chest performed on July, 2025 and that showed no concerning findings for disease progression  The patient was seen with Dr. Sherrod today.  Dr. Sherrod personally and independently reviewed the scan and discussed results with the patient today.  The scan showed ***.  Dr. Sherrod recommends ***  Dr. Sherrod recommends labs and CT in *** months. I will arrange for his next scan and we will see him 1 week later.   He will continue to follow with pulmonary medicine for his COPD.   He will follow up with radiation oncology as scheduled after his next brain MRI that is scheduled for 06/08/24.  The patient was advised to call immediately if she has any concerning symptoms in the interval. The patient voices understanding of current disease status and treatment options and is in agreement with the current care plan. All questions were answered. The patient knows to call the clinic with any problems, questions or concerns. We can certainly see the patient much sooner if necessary        No orders of the defined types were placed in this encounter.    I spent {CHL ONC TIME VISIT - DTPQU:[id number]} counseling the patient face to face. The total time  spent in the appointment was {CHL ONC TIME VISIT - DTPQU:[id number]}.  Deric Bocock L Ellisa Devivo, PA-C 05/15/24

## 2024-05-17 ENCOUNTER — Encounter: Payer: Self-pay | Admitting: Family Medicine

## 2024-05-18 ENCOUNTER — Telehealth: Payer: Self-pay

## 2024-05-18 ENCOUNTER — Inpatient Hospital Stay: Admitting: Physician Assistant

## 2024-05-18 NOTE — Telephone Encounter (Signed)
 Spoke with patients wife in regards to missed appt today.  Wife informed me that she has called twice and canceled this appt.  She states he has some things going on right now and we want to get through one thing at time. Informed wife to let our office know when the patient wants to reschedule.  Appt canceled for today. She voiced understanding.

## 2024-05-19 ENCOUNTER — Encounter: Payer: Self-pay | Admitting: Family Medicine

## 2024-05-19 DIAGNOSIS — M7020 Olecranon bursitis, unspecified elbow: Secondary | ICD-10-CM

## 2024-06-02 ENCOUNTER — Telehealth: Payer: Self-pay | Admitting: *Deleted

## 2024-06-02 ENCOUNTER — Telehealth: Payer: Self-pay

## 2024-06-02 NOTE — Telephone Encounter (Signed)
 Called patient to inform of MRI for 06-09-24 @ [facility] and telephone fu for 06-21-24, patient requested it to be cancelled and he wants to wait until the first of next year, notified Donald Husband

## 2024-06-02 NOTE — Telephone Encounter (Signed)
 Spoke with patients wife in regards to when patient needs another CT scan and appt.  Informed wife that patient missed the follow up appt for his last CT scan performed in July 2025.  Spoke with Cassie, PA and recommends the patient come in for an appt and will discuss the need for CT scans.  Appt made for 07/14/24 @ 130 PM for a virtual appt. She voiced understanding.

## 2024-06-08 ENCOUNTER — Telehealth: Payer: Self-pay | Admitting: *Deleted

## 2024-06-08 ENCOUNTER — Ambulatory Visit (HOSPITAL_COMMUNITY)

## 2024-06-08 NOTE — Telephone Encounter (Signed)
 Called patient to schedule MRI, patient stated that he wants to wait until next year, informed PA Donald Husband

## 2024-06-09 ENCOUNTER — Other Ambulatory Visit (HOSPITAL_COMMUNITY)

## 2024-06-09 ENCOUNTER — Other Ambulatory Visit: Payer: Self-pay | Admitting: Physician Assistant

## 2024-06-09 DIAGNOSIS — C3490 Malignant neoplasm of unspecified part of unspecified bronchus or lung: Secondary | ICD-10-CM

## 2024-06-11 ENCOUNTER — Other Ambulatory Visit (HOSPITAL_COMMUNITY)

## 2024-06-15 ENCOUNTER — Telehealth: Payer: Self-pay

## 2024-06-15 ENCOUNTER — Encounter: Payer: Self-pay | Admitting: Physician Assistant

## 2024-06-15 NOTE — Telephone Encounter (Signed)
 Tried to reach patients wife in regards to CT scan performed prior to appt on 07/14/24.  LVM informing wife of telephone number to call and schedule the scan [phone]) and if she had any questions to call our office back.

## 2024-06-17 ENCOUNTER — Encounter: Payer: Self-pay | Admitting: Physician Assistant

## 2024-06-21 ENCOUNTER — Ambulatory Visit: Admitting: Radiation Oncology

## 2024-06-24 ENCOUNTER — Telehealth: Payer: Self-pay

## 2024-06-24 ENCOUNTER — Other Ambulatory Visit: Payer: Self-pay

## 2024-06-24 DIAGNOSIS — J9611 Chronic respiratory failure with hypoxia: Secondary | ICD-10-CM

## 2024-06-24 NOTE — Telephone Encounter (Signed)
 Order placed / notes faxed      Copied from CRM [id number]. Topic: Clinical - Order For Equipment >> Jun 21, 2024 12:56 PM Ismael A wrote: Reason for CRM: janice w/ [employer] - requesting renewal order and office notes for pt's ventilator,  she states she placed request in [employer] on 12/05 and has not received a response back - needs documents before auth expires in March 2026

## 2024-06-28 ENCOUNTER — Telehealth: Payer: Self-pay | Admitting: Internal Medicine

## 2024-06-28 ENCOUNTER — Other Ambulatory Visit: Payer: Self-pay | Admitting: Family Medicine

## 2024-06-28 NOTE — Telephone Encounter (Signed)
 Please see the phone note 06/24/24- [employer] requested that we place the order.

## 2024-06-28 NOTE — Telephone Encounter (Signed)
 Per Arvella with [employer]  What is this order for? Is this for a new machine, supplies or something else. the order just says ventilator.  Please advise

## 2024-06-29 NOTE — Telephone Encounter (Signed)
 I sent this to the person who is requesting it and she sai they need the settings listed on the order for the vent. I think once the setttings are added that should take care of the request.   Per Brad with [employer]  Please advise

## 2024-06-30 NOTE — Telephone Encounter (Signed)
 I am confused. Is there a paper sheet? What settings do they have him on and I Can write it out

## 2024-06-30 NOTE — Telephone Encounter (Signed)
 Community msg sent to [place] to check on the settings. Will update this note once I hear back. Routing to me for f/u.

## 2024-07-02 ENCOUNTER — Other Ambulatory Visit: Payer: Self-pay | Admitting: Internal Medicine

## 2024-07-02 DIAGNOSIS — J449 Chronic obstructive pulmonary disease, unspecified: Secondary | ICD-10-CM

## 2024-07-05 ENCOUNTER — Telehealth: Payer: Self-pay | Admitting: Medical Oncology

## 2024-07-05 ENCOUNTER — Ambulatory Visit (HOSPITAL_COMMUNITY)

## 2024-07-05 NOTE — Telephone Encounter (Signed)
 Pt  called and said he is unable to come to his appts today and phone visit on 01/07. HE said he will call back to r/s  his appts. Appts  cancelled.

## 2024-07-05 NOTE — Telephone Encounter (Signed)
 Refill sent for Francisco [name]  to [facility]: [phone]  Dose: 3mg  by nebulizer twice daily   Last OV: 04/12/24 Provider: Dr. Geronimo  Next OV: January 2026 with NP and April 2026 with Dr. Geronimo - not yet scheduled  Routing to scheduling team for follow-up on appt scheduling  Aleck Puls, PharmD, BCPS Clinical Pharmacist  [facility]

## 2024-07-05 NOTE — Telephone Encounter (Signed)
 Received vent settings from [employer] via email- routing to MR

## 2024-07-06 ENCOUNTER — Inpatient Hospital Stay

## 2024-07-06 ENCOUNTER — Ambulatory Visit (HOSPITAL_COMMUNITY)

## 2024-07-12 ENCOUNTER — Ambulatory Visit: Admitting: Radiation Oncology

## 2024-07-14 ENCOUNTER — Inpatient Hospital Stay: Admitting: Internal Medicine

## 2024-07-27 ENCOUNTER — Telehealth (HOSPITAL_BASED_OUTPATIENT_CLINIC_OR_DEPARTMENT_OTHER): Payer: Self-pay | Admitting: Internal Medicine

## 2024-07-27 NOTE — Telephone Encounter (Signed)
 Hello,   We need an updated order with the settings listed out or a singed copy of the CMN that was sent.   Thank you,   Brad New with [employer]

## 2024-07-28 NOTE — Telephone Encounter (Signed)
 I have placed a document for you to sign in your mailbox. Please return to [facility] mailbox when signed. Thank you!

## 2024-07-29 NOTE — Telephone Encounter (Signed)
 This has been sent to [employer]. NFN

## 2024-07-29 NOTE — Telephone Encounter (Signed)
 Form was signed by MR   Ronal Lacks made aware

## 2024-08-01 ENCOUNTER — Encounter: Payer: Self-pay | Admitting: Family Medicine

## 2024-08-01 ENCOUNTER — Encounter: Payer: Self-pay | Admitting: Internal Medicine

## 2024-08-01 DIAGNOSIS — J449 Chronic obstructive pulmonary disease, unspecified: Secondary | ICD-10-CM

## 2024-08-02 ENCOUNTER — Other Ambulatory Visit: Payer: Self-pay

## 2024-08-06 NOTE — Telephone Encounter (Addendum)
 Copied from CRM #[id number]. Topic: Clinical - Medication Question >> Aug 03, 2024  4:35 PM Dedra B wrote: Reason for CRM: Patient's wife, Rogers, would like a new prescription for Ensifentrine  (OHTUVAYRE ) 3 MG/2.5ML SUSP sent to [facility] for a 90 day supply. The current prescription and refills are for 30 days but patient prefers 90.  Please advise .

## 2024-08-06 NOTE — Telephone Encounter (Signed)
 Copied from CRM #[id number]. Topic: Clinical - Medication Question >> Aug 04, 2024  3:27 PM LaVerne A wrote: Reason for CRM: Michaela from [facility] is calling to get clarification on this medication Ensifentrine  (OHTUVAYRE ) 3 MG/2.5ML SUSP.  Patient is requesting a 90-day supply as opposed to 30-day supply.  Please call [phone] or send by fax at [phone].  Thanks.

## 2024-08-10 MED ORDER — OHTUVAYRE 3 MG/2.5ML IN SUSP: 3.0000 mg | Freq: Two times a day (BID) | RESPIRATORY_TRACT | 1 refills | Status: AC

## 2024-08-10 NOTE — Progress Notes (Unsigned)
 Rx for 90 day supply of Ohtuvayre  triaged to [facility].

## 2024-08-12 ENCOUNTER — Other Ambulatory Visit: Payer: Self-pay

## 2024-08-12 MED ORDER — TRIAMCINOLONE ACETONIDE 0.1 % EX CREA: 1.0000 | TOPICAL_CREAM | Freq: Two times a day (BID) | CUTANEOUS | 0 refills | Status: AC

## 2024-08-12 NOTE — Telephone Encounter (Signed)
 Okay to send

## 2024-08-13 NOTE — Telephone Encounter (Signed)
 Pt needs further assistance

## 2024-11-01 ENCOUNTER — Ambulatory Visit: Admitting: Internal Medicine
